# Patient Record
Sex: Male | Born: 1960
Health system: Southern US, Community
[De-identification: ages and names within clinical notes are randomized; demographics above are authoritative.]

## PROBLEM LIST (undated history)

## (undated) DIAGNOSIS — I509 Heart failure, unspecified: Secondary | ICD-10-CM

## (undated) DIAGNOSIS — J449 Chronic obstructive pulmonary disease, unspecified: Secondary | ICD-10-CM

## (undated) DIAGNOSIS — F1010 Alcohol abuse, uncomplicated: Secondary | ICD-10-CM

## (undated) DIAGNOSIS — I472 Ventricular tachycardia: Secondary | ICD-10-CM

## (undated) DIAGNOSIS — F319 Bipolar disorder, unspecified: Secondary | ICD-10-CM

## (undated) DIAGNOSIS — F17200 Nicotine dependence, unspecified, uncomplicated: Secondary | ICD-10-CM

## (undated) DIAGNOSIS — B20 Human immunodeficiency virus [HIV] disease: Secondary | ICD-10-CM

## (undated) DIAGNOSIS — R05 Cough: Principal | ICD-10-CM

## (undated) DIAGNOSIS — F419 Anxiety disorder, unspecified: Secondary | ICD-10-CM

## (undated) DIAGNOSIS — I428 Other cardiomyopathies: Secondary | ICD-10-CM

## (undated) DIAGNOSIS — E785 Hyperlipidemia, unspecified: Secondary | ICD-10-CM

## (undated) DIAGNOSIS — J309 Allergic rhinitis, unspecified: Secondary | ICD-10-CM

## (undated) DIAGNOSIS — F329 Major depressive disorder, single episode, unspecified: Secondary | ICD-10-CM

## (undated) DIAGNOSIS — Z21 Asymptomatic human immunodeficiency virus [HIV] infection status: Secondary | ICD-10-CM

## (undated) DIAGNOSIS — F32A Depression, unspecified: Secondary | ICD-10-CM

## (undated) DIAGNOSIS — F4310 Post-traumatic stress disorder, unspecified: Secondary | ICD-10-CM

## (undated) DIAGNOSIS — I4729 Other ventricular tachycardia: Secondary | ICD-10-CM

## (undated) DIAGNOSIS — I10 Essential (primary) hypertension: Secondary | ICD-10-CM

## (undated) DIAGNOSIS — Z95810 Presence of automatic (implantable) cardiac defibrillator: Secondary | ICD-10-CM

## (undated) DIAGNOSIS — J42 Unspecified chronic bronchitis: Secondary | ICD-10-CM

## (undated) DIAGNOSIS — F1490 Cocaine use, unspecified, uncomplicated: Secondary | ICD-10-CM

## (undated) DIAGNOSIS — I5022 Chronic systolic (congestive) heart failure: Secondary | ICD-10-CM

## (undated) DIAGNOSIS — T7840XA Allergy, unspecified, initial encounter: Secondary | ICD-10-CM

## (undated) HISTORY — DX: Other cardiomyopathies: I42.8

## (undated) HISTORY — DX: Ventricular tachycardia: I47.2

## (undated) HISTORY — DX: Other ventricular tachycardia: I47.29

## (undated) HISTORY — DX: Hyperlipidemia, unspecified: E78.5

## (undated) HISTORY — DX: Allergy, unspecified, initial encounter: T78.40XA

## (undated) HISTORY — DX: Cough: R05

## (undated) HISTORY — DX: Allergic rhinitis, unspecified: J30.9

## (undated) HISTORY — DX: Chronic systolic (congestive) heart failure: I50.22

---

## 2004-09-21 ENCOUNTER — Ambulatory Visit: Payer: Self-pay | Admitting: Family Medicine

## 2004-09-22 ENCOUNTER — Encounter: Payer: Self-pay | Admitting: Internal Medicine

## 2004-09-22 ENCOUNTER — Ambulatory Visit: Payer: Self-pay | Admitting: Family Medicine

## 2004-09-29 ENCOUNTER — Ambulatory Visit: Payer: Self-pay | Admitting: *Deleted

## 2004-09-29 ENCOUNTER — Ambulatory Visit: Payer: Self-pay | Admitting: Family Medicine

## 2004-10-06 ENCOUNTER — Emergency Department (HOSPITAL_COMMUNITY): Admission: EM | Admit: 2004-10-06 | Discharge: 2004-10-06 | Payer: Self-pay | Admitting: Emergency Medicine

## 2005-07-24 ENCOUNTER — Ambulatory Visit: Payer: Self-pay | Admitting: Internal Medicine

## 2006-04-13 ENCOUNTER — Ambulatory Visit: Payer: Self-pay | Admitting: Internal Medicine

## 2008-06-08 ENCOUNTER — Emergency Department (HOSPITAL_COMMUNITY): Admission: EM | Admit: 2008-06-08 | Discharge: 2008-06-08 | Payer: Self-pay | Admitting: Emergency Medicine

## 2008-06-15 ENCOUNTER — Ambulatory Visit: Payer: Self-pay | Admitting: Internal Medicine

## 2008-06-15 LAB — CONVERTED CEMR LAB
ALT: 38 units/L (ref 0–53)
Albumin: 3.9 g/dL (ref 3.5–5.2)
Alkaline Phosphatase: 55 units/L (ref 39–117)
Basophils Absolute: 0 10*3/uL (ref 0.0–0.1)
Basophils Relative: 1 % (ref 0–1)
CD4 T Helper %: 39 % (ref 32–62)
Chloride: 110 meq/L (ref 96–112)
Eosinophils Relative: 2 % (ref 0–5)
Glucose, Bld: 77 mg/dL (ref 70–99)
HIV 1 RNA Quant: <50 copies/mL (ref <50)
Lymphocytes Relative: 47 % — ABNORMAL HIGH (ref 12–46)
Lymphs Abs: 1.2 10*3/uL (ref 0.7–4.0)
MCV: 94.1 fL (ref 78.0–100.0)
Neutrophils Relative %: 38 % — ABNORMAL LOW (ref 43–77)
Potassium: 4.1 meq/L (ref 3.5–5.3)
RDW: 14.3 % (ref 11.5–15.5)
Sodium: 142 meq/L (ref 135–145)
Total Bilirubin: 0.4 mg/dL (ref 0.3–1.2)
Total lymphocyte count: 1222 cells/mcL (ref 700–3300)

## 2008-06-25 ENCOUNTER — Ambulatory Visit: Payer: Self-pay | Admitting: Internal Medicine

## 2009-02-01 ENCOUNTER — Encounter: Payer: Self-pay | Admitting: Internal Medicine

## 2009-02-01 ENCOUNTER — Ambulatory Visit: Payer: Self-pay | Admitting: Internal Medicine

## 2009-02-01 DIAGNOSIS — B009 Herpesviral infection, unspecified: Secondary | ICD-10-CM | POA: Insufficient documentation

## 2009-02-01 DIAGNOSIS — B20 Human immunodeficiency virus [HIV] disease: Secondary | ICD-10-CM | POA: Insufficient documentation

## 2009-02-01 DIAGNOSIS — E785 Hyperlipidemia, unspecified: Secondary | ICD-10-CM | POA: Insufficient documentation

## 2009-02-01 DIAGNOSIS — J309 Allergic rhinitis, unspecified: Secondary | ICD-10-CM | POA: Insufficient documentation

## 2009-02-01 DIAGNOSIS — Z21 Asymptomatic human immunodeficiency virus [HIV] infection status: Secondary | ICD-10-CM | POA: Insufficient documentation

## 2009-02-01 DIAGNOSIS — R197 Diarrhea, unspecified: Secondary | ICD-10-CM | POA: Insufficient documentation

## 2009-02-01 LAB — CONVERTED CEMR LAB
ALT: 44 units/L (ref 0–53)
AST: 31 units/L (ref 0–37)
Albumin: 4.2 g/dL (ref 3.5–5.2)
BUN: 14 mg/dL (ref 6–23)
CD4 Count: 585 microliters
Chlamydia, Swab/Urine, PCR: NEGATIVE
Chloride: 110 meq/L (ref 96–112)
Eosinophils Relative: 0 % (ref 0–5)
GC Probe Amp, Urine: NEGATIVE
HIV-1 RNA Quant, Log: <1.68 (ref <1.68)
Lymphocytes Relative: 62 % — ABNORMAL HIGH (ref 12–46)
Lymphs Abs: 1.4 10*3/uL (ref 0.7–4.0)
MCV: 88.6 fL (ref 78.0–100.0)
Neutro Abs: 0.6 10*3/uL — ABNORMAL LOW (ref 1.7–7.7)
Neutrophils Relative %: 25 % — ABNORMAL LOW (ref 43–77)
RBC: 5.34 M/uL (ref 4.22–5.81)
Total Bilirubin: 0.5 mg/dL (ref 0.3–1.2)
Total Protein: 6.7 g/dL (ref 6.0–8.3)
Total lymphocyte count: 1426 cells/mcL (ref 700–3300)
WBC: 2.3 10*3/uL — ABNORMAL LOW (ref 4.0–10.5)

## 2009-02-02 ENCOUNTER — Encounter: Payer: Self-pay | Admitting: Internal Medicine

## 2009-02-04 ENCOUNTER — Emergency Department (HOSPITAL_COMMUNITY): Admission: EM | Admit: 2009-02-04 | Discharge: 2009-02-05 | Payer: Self-pay | Admitting: Emergency Medicine

## 2009-02-08 ENCOUNTER — Encounter: Payer: Self-pay | Admitting: Internal Medicine

## 2009-02-08 ENCOUNTER — Ambulatory Visit: Payer: Self-pay | Admitting: Internal Medicine

## 2009-02-08 LAB — CONVERTED CEMR LAB
HCV Ab: NEGATIVE
HCV Ab: NEGATIVE
Hepatitis B Surface Ag: NEGATIVE
Hepatitis B Surface Ag: NEGATIVE

## 2009-02-18 ENCOUNTER — Telehealth: Payer: Self-pay | Admitting: Internal Medicine

## 2009-04-22 ENCOUNTER — Encounter: Payer: Self-pay | Admitting: Internal Medicine

## 2009-06-24 ENCOUNTER — Encounter: Payer: Self-pay | Admitting: Internal Medicine

## 2009-09-09 ENCOUNTER — Emergency Department (HOSPITAL_COMMUNITY): Admission: EM | Admit: 2009-09-09 | Discharge: 2009-09-09 | Payer: Self-pay | Admitting: Emergency Medicine

## 2009-09-20 ENCOUNTER — Encounter: Payer: Self-pay | Admitting: Internal Medicine

## 2009-09-20 ENCOUNTER — Ambulatory Visit: Payer: Self-pay | Admitting: Internal Medicine

## 2009-09-20 DIAGNOSIS — J189 Pneumonia, unspecified organism: Secondary | ICD-10-CM | POA: Insufficient documentation

## 2009-09-20 LAB — CONVERTED CEMR LAB
ALT: 44 units/L (ref 0–53)
Basophils Absolute: 0 10*3/uL (ref 0.0–0.1)
Basophils Relative: 1 % (ref 0–1)
CD4 T Helper %: 41 % (ref 32–62)
Calcium: 8.9 mg/dL (ref 8.4–10.5)
Cholesterol: 173 mg/dL (ref 0–200)
Creatinine, Ser: 1.11 mg/dL (ref 0.40–1.50)
Eosinophils Relative: 1 % (ref 0–5)
HDL: 59 mg/dL (ref >39)
HIV 1 RNA Quant: <48 copies/mL (ref <48)
HIV-1 RNA Quant, Log: <1.68 (ref <1.68)
LDL Cholesterol: 50 mg/dL (ref 0–99)
Lymphocytes Relative: 55 % — ABNORMAL HIGH (ref 12–46)
Lymphs Abs: 1.9 10*3/uL (ref 0.7–4.0)
Monocytes Relative: 9 % (ref 3–12)
Neutro Abs: 1.2 10*3/uL — ABNORMAL LOW (ref 1.7–7.7)
Neutrophils Relative %: 35 % — ABNORMAL LOW (ref 43–77)
Platelets: 186 10*3/uL (ref 150–400)
Potassium: 4.3 meq/L (ref 3.5–5.3)
RDW: 13.8 % (ref 11.5–15.5)
Sodium: 139 meq/L (ref 135–145)
Total Bilirubin: 0.8 mg/dL (ref 0.3–1.2)
Total lymphocyte count: 1925 cells/mcL (ref 700–3300)
Triglycerides: 321 mg/dL — ABNORMAL HIGH (ref <150)
VLDL: 64 mg/dL — ABNORMAL HIGH (ref 0–40)

## 2009-09-30 ENCOUNTER — Encounter: Payer: Self-pay | Admitting: Internal Medicine

## 2009-09-30 ENCOUNTER — Ambulatory Visit: Payer: Self-pay | Admitting: Internal Medicine

## 2009-10-07 ENCOUNTER — Telehealth: Payer: Self-pay | Admitting: Internal Medicine

## 2010-05-02 ENCOUNTER — Encounter: Payer: Self-pay | Admitting: Internal Medicine

## 2010-05-06 ENCOUNTER — Emergency Department (HOSPITAL_COMMUNITY): Admission: EM | Admit: 2010-05-06 | Discharge: 2010-05-06 | Payer: Self-pay | Admitting: Emergency Medicine

## 2010-05-10 ENCOUNTER — Emergency Department (HOSPITAL_COMMUNITY): Admission: EM | Admit: 2010-05-10 | Discharge: 2010-05-11 | Payer: Self-pay | Admitting: Emergency Medicine

## 2011-01-18 NOTE — Miscellaneous (Signed)
Summary: Orders Update  Clinical Lists Changes  Orders: Added new Test order of T-Chlamydia  Probe, urine [phone]) - Signed Added new Test order of T-CBC w/Diff [id number]) - Signed Added new Test order of T-CD4SP [facility] [place]) (CD4SP) - Signed Added new Test order of T-GC Probe, urine [phone]) - Signed Added new Test order of T-Comprehensive Metabolic Panel [phone]) - Signed Added new Test order of T-HIV Viral Load [phone]) - Signed Added new Test order of T-RPR (Syphilis) ([id number]) - Signed Added new Test order of T-Urinalysis ([id number]) - Signed     Process Orders Check Orders Results:     [facility]: ABN not required for this insurance Tests Sent for requisitioning (May 02, 2010 4:26 PM):     05/02/2010: [facility] -- [employer], urine [id number] (signed)     05/02/2010: [facility] -- T-CBC w/Diff [[id number]] (signed)     05/02/2010: [facility] -- T-GC Probe, urine [phone] (signed)     05/02/2010: [facility] -- T-Comprehensive Metabolic Panel [[id number]] (signed)     05/02/2010: [facility] -- T-HIV Viral Load [id number] (signed)     05/02/2010: [facility] -- T-RPR (Syphilis) [id number] (signed)     05/02/2010: [facility] -- T-Urinalysis [[id number]] (signed)

## 2011-03-06 LAB — COMPREHENSIVE METABOLIC PANEL
ALT: 47 U/L (ref 0–53)
AST: 47 U/L — ABNORMAL HIGH (ref 0–37)
Albumin: 3.6 g/dL (ref 3.5–5.2)
Alkaline Phosphatase: 48 U/L (ref 39–117)
BUN: 18 mg/dL (ref 6–23)
Creatinine, Ser: 1.14 mg/dL (ref 0.4–1.5)
GFR calc Af Amer: >60 mL/min (ref >60)
Potassium: 3.4 mEq/L — ABNORMAL LOW (ref 3.5–5.1)
Sodium: 139 mEq/L (ref 135–145)
Total Bilirubin: 1.1 mg/dL (ref 0.3–1.2)
Total Protein: 5.9 g/dL — ABNORMAL LOW (ref 6.0–8.3)

## 2011-03-06 LAB — URINALYSIS, ROUTINE W REFLEX MICROSCOPIC
Bilirubin Urine: NEGATIVE
Glucose, UA: NEGATIVE mg/dL
Protein, ur: NEGATIVE mg/dL
Specific Gravity, Urine: 1.014 (ref 1.005–1.030)
Urobilinogen, UA: 1 mg/dL (ref 0.0–1.0)
pH: 5.5 (ref 5.0–8.0)

## 2011-03-06 LAB — DIFFERENTIAL
Eosinophils Relative: 1 % (ref 0–5)
Lymphocytes Relative: 43 % (ref 12–46)
Monocytes Absolute: 0.5 10*3/uL (ref 0.1–1.0)
Neutrophils Relative %: 44 % (ref 43–77)

## 2011-03-06 LAB — CBC
MCHC: 35.3 g/dL (ref 30.0–36.0)
WBC: 4.4 10*3/uL (ref 4.0–10.5)

## 2011-03-06 LAB — RPR: RPR Ser Ql: NONREACTIVE

## 2011-03-06 LAB — LIPASE, BLOOD: Lipase: 20 U/L (ref 11–59)

## 2011-03-06 LAB — GC/CHLAMYDIA PROBE AMP, GENITAL: GC Probe Amp, Genital: NEGATIVE

## 2011-03-24 LAB — DIFFERENTIAL
Basophils Absolute: 0 10*3/uL (ref 0.0–0.1)
Basophils Relative: 0 % (ref 0–1)
Eosinophils Absolute: 0 10*3/uL (ref 0.0–0.7)
Eosinophils Relative: 0 % (ref 0–5)
Neutro Abs: 4.2 10*3/uL (ref 1.7–7.7)
Neutrophils Relative %: 82 % — ABNORMAL HIGH (ref 43–77)

## 2011-03-24 LAB — CULTURE, BLOOD (ROUTINE X 2): Culture: NO GROWTH

## 2011-03-24 LAB — CBC
HCT: 53.1 % — ABNORMAL HIGH (ref 39.0–52.0)
Hemoglobin: 18.4 g/dL — ABNORMAL HIGH (ref 13.0–17.0)
MCHC: 34.6 g/dL (ref 30.0–36.0)
Platelets: 146 10*3/uL — ABNORMAL LOW (ref 150–400)

## 2011-03-24 LAB — POCT I-STAT 3, ART BLOOD GAS (G3+)
Acid-base deficit: 2 mmol/L (ref 0.0–2.0)
Bicarbonate: 20.7 mEq/L (ref 20.0–24.0)
TCO2: 22 mmol/L (ref 0–100)
pCO2 arterial: 29.7 mmHg — ABNORMAL LOW (ref 35.0–45.0)
pO2, Arterial: 72 mmHg — ABNORMAL LOW (ref 80.0–100.0)

## 2011-03-24 LAB — BASIC METABOLIC PANEL
Calcium: 8.8 mg/dL (ref 8.4–10.5)
Creatinine, Ser: 0.91 mg/dL (ref 0.4–1.5)
GFR calc non Af Amer: >60 mL/min (ref >60)
Sodium: 139 mEq/L (ref 135–145)

## 2011-06-27 ENCOUNTER — Inpatient Hospital Stay (HOSPITAL_COMMUNITY)
Admission: EM | Admit: 2011-06-27 | Discharge: 2011-07-04 | DRG: 292 | Disposition: A | Discharge status: Home / Self Care | Payer: 59 | Attending: Internal Medicine | Admitting: Internal Medicine

## 2011-06-27 ENCOUNTER — Emergency Department (HOSPITAL_COMMUNITY): Payer: Self-pay

## 2011-06-27 DIAGNOSIS — IMO0002 Reserved for concepts with insufficient information to code with codable children: Secondary | ICD-10-CM

## 2011-06-27 DIAGNOSIS — I509 Heart failure, unspecified: Secondary | ICD-10-CM | POA: Diagnosis present

## 2011-06-27 DIAGNOSIS — Z21 Asymptomatic human immunodeficiency virus [HIV] infection status: Secondary | ICD-10-CM | POA: Diagnosis present

## 2011-06-27 DIAGNOSIS — Z9119 Patient's noncompliance with other medical treatment and regimen: Secondary | ICD-10-CM

## 2011-06-27 DIAGNOSIS — A6000 Herpesviral infection of urogenital system, unspecified: Secondary | ICD-10-CM | POA: Diagnosis present

## 2011-06-27 DIAGNOSIS — Z7982 Long term (current) use of aspirin: Secondary | ICD-10-CM

## 2011-06-27 DIAGNOSIS — Z79899 Other long term (current) drug therapy: Secondary | ICD-10-CM

## 2011-06-27 DIAGNOSIS — F1010 Alcohol abuse, uncomplicated: Secondary | ICD-10-CM | POA: Diagnosis present

## 2011-06-27 DIAGNOSIS — F1410 Cocaine abuse, uncomplicated: Secondary | ICD-10-CM | POA: Diagnosis present

## 2011-06-27 DIAGNOSIS — J209 Acute bronchitis, unspecified: Secondary | ICD-10-CM | POA: Diagnosis present

## 2011-06-27 DIAGNOSIS — I428 Other cardiomyopathies: Secondary | ICD-10-CM | POA: Diagnosis present

## 2011-06-27 DIAGNOSIS — Z91199 Patient's noncompliance with other medical treatment and regimen due to unspecified reason: Secondary | ICD-10-CM

## 2011-06-27 DIAGNOSIS — I472 Ventricular tachycardia, unspecified: Secondary | ICD-10-CM | POA: Diagnosis present

## 2011-06-27 DIAGNOSIS — Z5900 Homelessness unspecified: Secondary | ICD-10-CM

## 2011-06-27 DIAGNOSIS — N179 Acute kidney failure, unspecified: Secondary | ICD-10-CM | POA: Diagnosis present

## 2011-06-27 DIAGNOSIS — I5023 Acute on chronic systolic (congestive) heart failure: Principal | ICD-10-CM | POA: Diagnosis present

## 2011-06-27 DIAGNOSIS — F17200 Nicotine dependence, unspecified, uncomplicated: Secondary | ICD-10-CM | POA: Diagnosis present

## 2011-06-27 DIAGNOSIS — I10 Essential (primary) hypertension: Secondary | ICD-10-CM | POA: Diagnosis present

## 2011-06-27 DIAGNOSIS — I4729 Other ventricular tachycardia: Secondary | ICD-10-CM | POA: Diagnosis present

## 2011-06-27 HISTORY — DX: Human immunodeficiency virus (HIV) disease: B20

## 2011-06-27 HISTORY — DX: Essential (primary) hypertension: I10

## 2011-06-27 HISTORY — DX: Asymptomatic human immunodeficiency virus (hiv) infection status: Z21

## 2011-06-27 HISTORY — DX: Nicotine dependence, unspecified, uncomplicated: F17.200

## 2011-06-27 HISTORY — DX: Heart failure, unspecified: I50.9

## 2011-06-28 ENCOUNTER — Encounter (HOSPITAL_COMMUNITY): Payer: Self-pay | Admitting: Radiology

## 2011-06-28 ENCOUNTER — Emergency Department (HOSPITAL_COMMUNITY): Payer: Self-pay

## 2011-06-28 DIAGNOSIS — R072 Precordial pain: Secondary | ICD-10-CM

## 2011-06-28 LAB — LIPID PANEL
Cholesterol: 134 mg/dL (ref 0–200)
Total CHOL/HDL Ratio: 2.4 RATIO
VLDL: 32 mg/dL (ref 0–40)

## 2011-06-28 LAB — CK TOTAL AND CKMB (NOT AT ARMC)
CK, MB: 7.2 ng/mL (ref 0.3–4.0)
Relative Index: 2.1 (ref 0.0–2.5)
Total CK: 340 U/L — ABNORMAL HIGH (ref 7–232)

## 2011-06-28 LAB — CARDIAC PANEL(CRET KIN+CKTOT+MB+TROPI): Total CK: 256 U/L — ABNORMAL HIGH (ref 7–232)

## 2011-06-28 LAB — DIFFERENTIAL
Basophils Absolute: 0 10*3/uL (ref 0.0–0.1)
Eosinophils Absolute: 0 10*3/uL (ref 0.0–0.7)
Eosinophils Relative: 1 % (ref 0–5)

## 2011-06-28 LAB — CBC
MCV: 91.2 fL (ref 78.0–100.0)
Platelets: 150 10*3/uL (ref 150–400)
RDW: 13.1 % (ref 11.5–15.5)
WBC: 4.5 10*3/uL (ref 4.0–10.5)

## 2011-06-28 LAB — TROPONIN I: Troponin I: <0.3 ng/mL (ref <0.30)

## 2011-06-28 LAB — BASIC METABOLIC PANEL
Chloride: 101 mEq/L (ref 96–112)
GFR calc Af Amer: >60 mL/min (ref >60)
Potassium: 3.1 mEq/L — ABNORMAL LOW (ref 3.5–5.1)

## 2011-06-28 LAB — DIGOXIN LEVEL: Digoxin Level: <0.3 ng/mL — ABNORMAL LOW (ref 0.8–2.0)

## 2011-06-28 LAB — LACTATE DEHYDROGENASE: LDH: 323 U/L — ABNORMAL HIGH (ref 94–250)

## 2011-06-28 MED ORDER — IOHEXOL 350 MG/ML SOLN
100.0000 mL | Freq: Once | INTRAVENOUS | Status: AC | PRN
  Administered 2011-06-28: 100 mL via INTRAVENOUS

## 2011-06-29 DIAGNOSIS — I5023 Acute on chronic systolic (congestive) heart failure: Secondary | ICD-10-CM

## 2011-06-29 LAB — BASIC METABOLIC PANEL
BUN: 12 mg/dL (ref 6–23)
CO2: 27 mEq/L (ref 19–32)
Chloride: 105 mEq/L (ref 96–112)
Creatinine, Ser: 0.95 mg/dL (ref 0.50–1.35)
Glucose, Bld: 136 mg/dL — ABNORMAL HIGH (ref 70–99)

## 2011-06-29 LAB — CBC
HCT: 44.9 % (ref 39.0–52.0)
Hemoglobin: 16.3 g/dL (ref 13.0–17.0)
MCV: 92.4 fL (ref 78.0–100.0)
RBC: 4.86 MIL/uL (ref 4.22–5.81)
RDW: 13.4 % (ref 11.5–15.5)
WBC: 1.9 10*3/uL — ABNORMAL LOW (ref 4.0–10.5)

## 2011-06-29 LAB — EPSTEIN-BARR VIRUS VCA, IGM: EBV VCA IgM: 0.2 {ISR}

## 2011-06-30 DIAGNOSIS — I509 Heart failure, unspecified: Secondary | ICD-10-CM

## 2011-06-30 LAB — BASIC METABOLIC PANEL
Calcium: 8.7 mg/dL (ref 8.4–10.5)
GFR calc Af Amer: >60 mL/min (ref >60)
GFR calc non Af Amer: >60 mL/min (ref >60)
Potassium: 3.8 mEq/L (ref 3.5–5.1)
Sodium: 137 mEq/L (ref 135–145)

## 2011-06-30 LAB — HEMOGLOBIN A1C
Hgb A1c MFr Bld: 5.3 % (ref <5.7)
Mean Plasma Glucose: 105 mg/dL (ref <117)

## 2011-06-30 LAB — CMV IGM: CMV IgM: 0.27 (ref <0.90)

## 2011-07-01 LAB — BASIC METABOLIC PANEL
CO2: 26 mEq/L (ref 19–32)
Calcium: 8.9 mg/dL (ref 8.4–10.5)
Glucose, Bld: 91 mg/dL (ref 70–99)
Potassium: 3.8 mEq/L (ref 3.5–5.1)
Sodium: 135 mEq/L (ref 135–145)

## 2011-07-02 LAB — BASIC METABOLIC PANEL
BUN: 24 mg/dL — ABNORMAL HIGH (ref 6–23)
Chloride: 100 mEq/L (ref 96–112)
Glucose, Bld: 110 mg/dL — ABNORMAL HIGH (ref 70–99)
Potassium: 4.3 mEq/L (ref 3.5–5.1)

## 2011-07-03 LAB — BASIC METABOLIC PANEL
BUN: 22 mg/dL (ref 6–23)
CO2: 24 mEq/L (ref 19–32)
Chloride: 103 mEq/L (ref 96–112)
Creatinine, Ser: 1.18 mg/dL (ref 0.50–1.35)

## 2011-07-04 LAB — BASIC METABOLIC PANEL
CO2: 25 mEq/L (ref 19–32)
Calcium: 9.1 mg/dL (ref 8.4–10.5)
Creatinine, Ser: 0.92 mg/dL (ref 0.50–1.35)
Glucose, Bld: 88 mg/dL (ref 70–99)

## 2011-07-04 NOTE — H&P (Signed)
[name], Long NO.:  [id number]  MEDICAL RECORD NO.:  [id number]  LOCATION:  [facility]                         FACILITY:  [facility]  PHYSICIAN:  Ryan Long, MDDATE OF BIRTH:  1961/04/22  DATE OF ADMISSION:  06/27/2011 DATE OF DISCHARGE:                             HISTORY & PHYSICAL   PRIMARY CARE PHYSICIAN:  Ryan Endo L. Philipp Deputy, MD  CHIEF COMPLAINT:  Chest pain.  HISTORY OF PRESENT ILLNESS:  A 50 year old male with known history of HIV, hypertension, who was recently diagnosed with CHF 3 months ago when he was admitted to [facility] for shortness of breath  and was told that his heart was weak.  Presenting Complaint was chest pain. The patient stated he has been having chest pain over the last 1 week, which is retrosternal, stabbing in nature, increased with deep inspiration.  Also increases with exertion.  It happens few minutes and happened multiple times.  In the ER, the patient's cardiac enzymes which at this time was showing increased CK but normal troponin.  His D-dimer was high for which a CT angio chest was done, which was negative for PE. The patient was admitted for further workup.  The patient stated in addition to his chest pain, he also gets off and on shortness of breath with some cough.  He denies any fever, chills, or phlegm.  He denies any headache or visual symptoms.  He sometimes gets tachycardic and palpitations and at the same time gets dizzy.  He has not lost consciousness.  He denies any focal deficit.  He denies any nausea, vomiting, abdominal pain, dysuria, discharge, or diarrhea.  PAST MEDICAL HISTORY: 1. History of HIV. 2. History of hypertension and was recently diagnosed with CHF at     [facility] in March 2012. 3. History of genital herpes.  PAST SURGICAL HISTORY:  None.  MEDICATION ON ADMISSION:  Coreg, digoxin, Isentress, lisinopril, Truvada, and valacyclovir.  ALLERGIES:  No known drug  allergies.  FAMILY HISTORY:  Nothing contributory.  SOCIAL HISTORY:  The patient smokes cigarettes, denies any alcohol or drug abuse.  REVIEW OF SYSTEMS:  As per the history of present illness, nothing else significant.  PHYSICAL EXAMINATION:  GENERAL:  The patient examined at bedside, not in acute distress. VITAL SIGNS:  Blood pressure 116/70, pulse is 90 per minute, temperature 97.8, respirations 18, O2 sat 97%. HEENT:  Anicteric.  No pallor.  No discharge from ears, eyes, nose, or mouth. CHEST:  Bilateral air entry present.  No rhonchi.  No crepitation. HEART:  S1 and S2 heard. ABDOMEN:  Soft, nontender.  Bowel sounds heard. CNS:  The patient is alert, awake, oriented to time, place, and person. Moves upper and lower extremities, 5/5. EXTREMITIES:  Peripheral pulses felt.  No acute ischemic changes, cyanosis, clubbing, or any edema.  LABORATORY DATA:  EKG shows normal sinus rhythm with sinus tachycardia. Beats around 101 beats per minute with poor R-wave progression and T- wave changes concerning for ischemia.  CBC:  WBCs 4.5, hemoglobin is 13.8, hematocrit 46.5, platelets 150.  D-dimer 1.1.  Basic metabolic panel:  Sodium 138, potassium 3.1, chloride 101, carbon dioxide 29, glucose 151, BUN 12, creatinine  0.8, calcium 8.5.  CK is 340, MB is 7.2, troponin less than 0.3, BNP 696.  Digoxin less than 0.3.  Chest x-ray shows no evidence of active pulmonary disease.  CT angio chest shows no evidence of significant pulmonary embolism, bronchial wall thickening suggesting changes of chronic bronchitis with reactive airways disease.  ASSESSMENT: 1. Chest pain, rule out acute coronary syndrome. 2. History of congestive heart failure per the patient. 3. History of human immunodeficiency virus with last CD-4 count more     than 515, February 2012 and viral load was undetectable as per the     patient. 4. History of hypertension. 5. History of genital herpes.  PLAN: 1. At this  time, we will admit the patient to telemetry. 2. For chest pain, we will cycle cardiac markers.  The patient will be     on aspirin, nitroglycerin p.r.n., and get a 2-D echo.  I have     already consulted Cardiology.  We will follow their recommendation. 3. History of HIV.  The patient states that CD-4 count was 515 in     February 2012 and viral load was undetectable.  At this time, we     will repeat his CD-4 count and viral load.  At this time, the     patient also was saying that he is sometimes short of breath which     could be from his possible CHF.  We do not have any reference at     this time, but with regard to his HIV, I am going to get his LDH     checked and also his viral load and CD-4 count.  Until then, I am     going to keep the patient on Bactrim PCP dose, which can be     discontinued once these labs are available, and if there is no     definite proof for PCP.  At this time, the patient is saturating     100% on room air. 4. History of hypertension.  The patient is on Coreg, lisinopril, and     digoxin.  We need to verify his home medication and continue and     further recommendation based on labs ordered, clinical course, and     recommendations.     Ryan Clos, MD     ANK/MEDQ  D:  06/28/2011  T:  06/28/2011  Job:  [id number]  cc:   Ryan Long, M.D.  Electronically Signed by Ryan Minium MD on 07/04/2011 07:27:16 AM

## 2011-07-12 NOTE — Discharge Summary (Signed)
Ryan Long, [name] NO.:  [id number]  MEDICAL RECORD NO.:  [id number]  LOCATION:                                 FACILITY:  PHYSICIAN:  Clydia Llano, MD       DATE OF BIRTH:  12/16/1961  DATE OF ADMISSION:  06/27/2011 DATE OF DISCHARGE:                              DISCHARGE SUMMARY   PRIMARY CARE PHYSICIAN:  Dr. Earmon Phoenix at [facility]  PRIMARY CARDIOLOGIST:  Dr. Lew Dawes, [facility].  REASON FOR ADMISSION:  Chest pain/tightness.  DISCHARGE DIAGNOSES: 1. Mild acute-on-chronic systolic heart failure. 2. Acute bronchitis. 3. Human immunodeficiency virus. 4. Genital herpes. 5. Acute renal failure, resolved. 6. Nonsustained ventricular tachycardia.  DISCHARGE MEDICATIONS: 1. Aspirin 81 mg p.o. daily. 2. Carvedilol 3.125 mg p.o. b.i.d. 3. Truvada one tablet p.o. daily. 4. Furosemide 20 mg p.o. daily. 5. Xopenex inhaler 0.63 four tablets daily as needed for shortness of     breath. 6. Prednisone 10 mg taper, finish in 6 days. 7. Isentress 400 mg p.o. b.i.d. 8. Lisinopril 5 mg p.o. daily.  CONSULTS:  Bevelyn Buckles. Bensimhon, MD,  Cardiology.  BRIEF HISTORY AND EXAMINATION:  [name] is a 50 year old male with known history of HIV and recently diagnosed congestive heart failure about 3 months ago.  The patient came into the hospital complaining about chest pain, chest tightness, lasting for the last week.  The patient says it is retrosternal, stabbing in nature, increased with deep inspiration, also increased with exertion.  Happened for a few minutes and multiple times in the emergency department.  Initial evaluation showed negative cardiac enzymes and negative troponin.  D-dimer was high.  The patient was evaluated by CT angio which was negative for a PE and showing some bronchitic changes.  The patient is admitted for further evaluation.  RADIOLOGY: 1. CT angio showed no evidence of significant PE,  bronchial wall     thickening suggesting changes of chronic bronchitis or reactive     airway disease. 2. Chest x-ray, no evidence of active pulmonary disease.  BRIEF HOSPITAL COURSE:  1. Mild acute-on-chronic systolic heart failure.  The patient because     of the chest pain and shortness of breath was admitted to the     hospital, started on his home medication.  Echocardiogram was     obtained, showed ejection fraction of 33% with mild mitral     regurgitation.  There is no wall motion abnormality.  Cardiology     was consulted.  The patient was placed on lisinopril, Coreg,     Aldactone, and Lasix but his kidney function was declining, so the     Aldactone and the lisinopril were held and Lasix dose was decreased     to 20 mg.  At the time of discharge, Aldactone was discontinued and     the patient is to follow up with his primary care physician.  He is     to take Coreg, lisinopril, and low dose of Lasix.  His breathing     got much better as well as his shortness of breath, he did not need  oxygen.  His vitals on the day of discharge, he was satting 99% on     room air and his respirations were 20, blood pressure is 124/94. 2. Acute bronchitis.  The patient after got admitted to the hospital     started to wheeze, was complaining about some wheezing.  Initially,     it was thought to be secondary to the beta-blockers but then he     said he was taking the beta-blockers for the past 3 months without     any problems.  The patient also was coughing some.  The patient was     put on Zithromax for 5 days as well as the prednisone and     mucolytic.  The patient was doing fine.  On the day of discharge,     he was not wheezing.  The patient is to continue on prednisone     taper for 6 days. 3. Nonsustained ventricular tachycardia.  The patient noticed on the     cardiac telemetry to have episodes of nonsustained ventricular     tachycardia.  Cardiology recommended to continue on  the beta-     blocker and optimize the medical therapy before thinking about     implanted defibrillator/pacemakers.  The patient is to follow up     with his primary cardiologist.  He will be on Coreg. 4. Acute renal failure.  This was mild and thought to be secondary to     dehydration from diuresis and the ACE inhibitors.  His creatinine     went up to 1.4.  After holding of the medications, it went down to     1.0 on the day of discharge. 5. History of HIV infection and genital herpes.  The patient is on his     HAART on Truvada and Isentress.  His CD-4 count is 550.  The     patient is to be continued on his medications. 6. Nonischemic cardiomyopathy.  The patient did have an ejection     fraction of 30-35% with global hypokinesia.  Cardiology was     consulted to determine the cause of the cardiomyopathy.  It seemed     as the patient did have recent workup in [facility] [place] angiogram     and there is coronary calcium which does not suggest ischemic     cardiomyopathy.  His CD-4 does not qualify him for AIDS.  According     to Cardiology,  recommendation in order to have HIV-mediated     cardiomyopathy, your CD-4 count should be less than 100.  The     cardiomyopathy is likely to be secondary to dilated cardiomyopathy,     secondary to substance abuse.  As mentioned above, recommendation     is to optimize medical therapy and then check on the ventricular     arrhythmias.  DISCHARGE INSTRUCTIONS: 1. Activity as tolerated. 2. Disposition.  Home. 3. Dietary, heart-healthy diet.     Clydia Llano, MD     ME/MEDQ  D:  07/04/2011  T:  07/04/2011  Job:  [id number]  cc:   Dr. Earmon Phoenix at [facility] Dr. Lew Dawes, [facility]  Electronically Signed by Clydia Llano  on 07/12/2011 08:08:31 PM

## 2011-07-13 ENCOUNTER — Encounter: Payer: Self-pay | Admitting: Physician Assistant

## 2011-07-18 ENCOUNTER — Encounter: Payer: Self-pay | Admitting: Physician Assistant

## 2011-08-09 ENCOUNTER — Emergency Department (HOSPITAL_COMMUNITY)
Admission: EM | Admit: 2011-08-09 | Discharge: 2011-08-10 | Disposition: A | Discharge status: Home / Self Care | Payer: Self-pay | Attending: Emergency Medicine | Admitting: Emergency Medicine

## 2011-08-09 ENCOUNTER — Other Ambulatory Visit: Payer: Self-pay | Admitting: Emergency Medicine

## 2011-08-09 DIAGNOSIS — Z21 Asymptomatic human immunodeficiency virus [HIV] infection status: Secondary | ICD-10-CM | POA: Insufficient documentation

## 2011-08-09 DIAGNOSIS — N342 Other urethritis: Secondary | ICD-10-CM | POA: Insufficient documentation

## 2011-08-09 DIAGNOSIS — I10 Essential (primary) hypertension: Secondary | ICD-10-CM | POA: Insufficient documentation

## 2011-08-09 DIAGNOSIS — I509 Heart failure, unspecified: Secondary | ICD-10-CM | POA: Insufficient documentation

## 2011-08-09 DIAGNOSIS — Z79899 Other long term (current) drug therapy: Secondary | ICD-10-CM | POA: Insufficient documentation

## 2011-08-09 DIAGNOSIS — R300 Dysuria: Secondary | ICD-10-CM | POA: Insufficient documentation

## 2011-08-09 DIAGNOSIS — R369 Urethral discharge, unspecified: Secondary | ICD-10-CM | POA: Insufficient documentation

## 2011-08-10 ENCOUNTER — Other Ambulatory Visit: Payer: Self-pay

## 2011-08-10 ENCOUNTER — Other Ambulatory Visit: Payer: Self-pay | Admitting: Emergency Medicine

## 2011-08-10 DIAGNOSIS — B20 Human immunodeficiency virus [HIV] disease: Secondary | ICD-10-CM

## 2011-08-14 ENCOUNTER — Other Ambulatory Visit: Payer: Self-pay

## 2011-08-14 ENCOUNTER — Other Ambulatory Visit: Payer: Self-pay | Admitting: Internal Medicine

## 2011-08-14 DIAGNOSIS — B20 Human immunodeficiency virus [HIV] disease: Secondary | ICD-10-CM

## 2011-08-14 LAB — URINALYSIS, ROUTINE W REFLEX MICROSCOPIC
Bilirubin Urine: NEGATIVE
Glucose, UA: NEGATIVE mg/dL
Ketones, ur: NEGATIVE mg/dL
Leukocytes, UA: NEGATIVE
pH: 5.5 (ref 5.0–8.0)

## 2011-08-15 ENCOUNTER — Telehealth: Payer: Self-pay | Admitting: *Deleted

## 2011-08-15 LAB — COMPLETE METABOLIC PANEL WITH GFR
ALT: 25 U/L (ref 0–53)
AST: 18 U/L (ref 0–37)
Albumin: 4.7 g/dL (ref 3.5–5.2)
Alkaline Phosphatase: 43 U/L (ref 39–117)
Glucose, Bld: 87 mg/dL (ref 70–99)
Potassium: 4.2 mEq/L (ref 3.5–5.3)
Sodium: 140 mEq/L (ref 135–145)
Total Protein: 6.6 g/dL (ref 6.0–8.3)

## 2011-08-15 LAB — CBC WITH DIFFERENTIAL/PLATELET
Basophils Relative: 1 % (ref 0–1)
Hemoglobin: 16.9 g/dL (ref 13.0–17.0)
MCHC: 36.3 g/dL — ABNORMAL HIGH (ref 30.0–36.0)
Monocytes Relative: 12 % (ref 3–12)
Neutro Abs: 1.6 10*3/uL — ABNORMAL LOW (ref 1.7–7.7)
Neutrophils Relative %: 44 % (ref 43–77)
RBC: 4.94 MIL/uL (ref 4.22–5.81)

## 2011-08-15 NOTE — Consult Note (Signed)
NAMEKAZUMI, LACHNEY NO.:  [id number]  MEDICAL RECORD NO.:  [id number]  LOCATION:  2025                         FACILITY:  [facility]  PHYSICIAN:  Bevelyn Buckles. Bensimhon, MDDATE OF BIRTH:  06-18-1961  DATE OF CONSULTATION:  06/29/2011 DATE OF DISCHARGE:                                CONSULTATION   PRIMARY CARE PHYSICIAN:  None.  CARDIOLOGIST:  New to [facility].  REQUESTING PHYSICIAN:  Clydia Llano, MD of the [facility].  REASON FOR CONSULTATION:  Reduced ejection fraction.  HISTORY OF PRESENT ILLNESS:  Mr. Gillson is a 50 year old male with a history of HIV, hypertension, and polysubstance abuse.  In looking over his records, he was admitted to [facility] in March of this year with chest pain.  He initially underwent a CT scan of his chest, which showed no evidence of pulmonary embolus.  He then had an echocardiogram which showed an ejection fraction of 25% with global hypokinesis.  He subsequently underwent a coronary CT which showed normal coronary arteries.  He was apparently started on ACE inhibitor and beta-blocker.  He was homeless and then moved to [place] to stay with some family.  He subsequently moved back.  Unfortunately, he is currently homeless again.  He was admitted on June 27, 2011, for chest pain, worse with inspiration and expiration.  His CKs were mildly elevated, but his troponins were normal.  He ended up with another CT angio of the chest for pulmonary embolus and this was negative.  There was some bronchial wall thickening suggestive of chronic bronchitis. There was no mention of heart failure.  He underwent a repeat echocardiogram, which showed an ejection fraction of 30% to 35%.  The ventricle was mildly dilated with left ventricular internal diameter and diastole of 54 mm.  There was global hypokinesis.  There was mild mitral regurgitation, but no other significant valvular disease.  While in  the hospital, he has also had several brief runs of asymptomatic nonsustained VT.  From a functional point of view, he says that he gets short of breath on walking a little bit.  If he goes to store, he gets tired after walking a few aisles and has to stop.  He does occasionally get chest pain with this, but his main symptom is dyspnea.  He denies any orthopnea, no PND, no lower extremity edema.  He says he occasionally is dizzy, but denies palpitations or syncope.  He ran out of his heart meds about a week ago.  Unfortunately, he continues to smoke.  He has a history of alcohol and cocaine use, but says that he is not doing this any more.  REVIEW OF SYSTEMS:  He denies pain in his left foot making it difficult to put his shoes on.  He denies a history of gout.  He has had some chills, but no fevers.  He does have occasional wheezing as well as some joint pain.  Remainder of review of systems is negative except for HPI and problem list.  PROBLEM LIST: 1. HIV disease.     a.     Most recent CD4 count is 550.  There is no history of  opportunistic infections. 2. Hypertension. 3. Nonischemic cardiomyopathy with ejection fraction around 30% to 35%     as described above.     a.     Coronary CT at [facility] in March 2012 showed no coronary      artery disease. 4. Tobacco use, ongoing. 5. History of polysubstance abuse.  CURRENT MEDICATIONS:  In the hospital here are, 1. Aspirin 325. 2. Albuterol p.r.n. 3. Enoxaparin 40 a day. 4. Sulfa 2 tablets t.i.d.  SOCIAL HISTORY:  He currently is homeless.  He is not married.  He does not have any children.  He has a history of tobacco one pack per day for many years which is ongoing.  He has a history of alcohol and cocaine use per the chart, which he currently denies.  FAMILY HISTORY:  Father died with a history of myocardial infarctions x2 and hypertension.  PHYSICAL EXAM:  GENERAL:  He is a stoic man, in no acute  distress. VITAL SIGNS:  Blood pressure is 125/56, heart rate is 73.  His respirations are unlabored.  He is satting 98% on room air. HEENT:  Normal. NECK:  Supple.  He has JVP that is probably about 10 cm of water. Carotids are 2+ bilaterally without any bruits.  There is no lymphadenopathy or thyromegaly. CARDIAC:  PMI is nondisplaced.  He is regular.  I do not hear an S3.  He does have a soft mitral regurgitation murmur. LUNGS:  Have minimal end-expiratory wheezing throughout. ABDOMEN:  Full and distended.  He has normal bowel sounds.  No bruits. EXTREMITIES:  Warm with no cyanosis or clubbing.  There is trace edema. No rash. NEURO:  Alert and oriented x3.  Cranial nerves II through XII intact. Moves all four extremities without difficulty.  Affect is reasonable.  LABORATORY DATA:  Chest x-ray shows no CHF.  EKG shows sinus rhythm with inferolateral T-wave inversions.  I do not have an old one to compare. These appeared to be mostly repole abnormalities.  White counts 1.9, hemoglobin 16.3, platelets 116.  Sodium 140, potassium 3.6, BUN 12, creatinine 0.95, glucose 136.  BNP is 696.  Troponins have all been negative less than 0.30.  ASSESSMENT: 1. Acute on chronic systolic heart failure secondary to nonischemic     cardiomyopathy, ejection fraction of 30% to 35% range. 2. Nonsustained ventricular tachycardia, asymptomatic. 3. Human immunodeficiency virus. 4. Medication noncompliance. 5. Homelessness. 6. Trach use, ongoing.  PLAN:  Mr. Jaber now has evidence of acute or chronic heart failure. He has mild-to-moderate volume overload and I suspect he has 10-15 pounds of volume on board.  Would recommend restarting his ACE inhibitor and beta-blocker as well as IV Lasix and spironolactone as tolerated. In reviewing his records, his coronary arteries were normal, so he likely has a nonischemic cardiomyopathy.  Given a CD4 count over 100, it is very unlikely that this is related to  his HIV and more likely related to possibly another virus or his previous substance abuse/hypertension.  He will not be an ICD candidate until his medications are titrated and he has been compliant with them.  He will need social work evaluation to help him with his homelessness.  We appreciate the consult.  We will follow with you as needed.     Bevelyn Buckles. Bensimhon, MD     DRB/MEDQ  D:  06/29/2011  T:  06/29/2011  Job:  [id number]  Electronically Signed by Arvilla Meres MD on 08/15/2011 05:17:32 PM

## 2011-08-15 NOTE — Telephone Encounter (Signed)
Ryan Long with [facility] called to see if we could make a referral for him at a cardiologist office. He had one when he was in the hospital. He has no insurance. Does have ADAP. Told her since we have not seen him, he should call the cards group that saw him in the hospital.  She says she might call the hospitalist for the referral.

## 2011-08-16 LAB — HIV-1 RNA QUANT-NO REFLEX-BLD
HIV 1 RNA Quant: <20 copies/mL (ref <20)
HIV-1 RNA Quant, Log: <1.3 {Log} (ref <1.30)

## 2011-08-23 ENCOUNTER — Emergency Department (HOSPITAL_COMMUNITY)
Admission: EM | Admit: 2011-08-23 | Discharge: 2011-08-23 | Disposition: A | Discharge status: Home / Self Care | Payer: Self-pay | Attending: Emergency Medicine | Admitting: Emergency Medicine

## 2011-08-23 DIAGNOSIS — Z79899 Other long term (current) drug therapy: Secondary | ICD-10-CM | POA: Insufficient documentation

## 2011-08-23 DIAGNOSIS — N342 Other urethritis: Secondary | ICD-10-CM | POA: Insufficient documentation

## 2011-08-23 DIAGNOSIS — I10 Essential (primary) hypertension: Secondary | ICD-10-CM | POA: Insufficient documentation

## 2011-08-23 DIAGNOSIS — R369 Urethral discharge, unspecified: Secondary | ICD-10-CM | POA: Insufficient documentation

## 2011-08-23 DIAGNOSIS — I509 Heart failure, unspecified: Secondary | ICD-10-CM | POA: Insufficient documentation

## 2011-08-23 DIAGNOSIS — R300 Dysuria: Secondary | ICD-10-CM | POA: Insufficient documentation

## 2011-08-23 DIAGNOSIS — Z21 Asymptomatic human immunodeficiency virus [HIV] infection status: Secondary | ICD-10-CM | POA: Insufficient documentation

## 2011-08-24 ENCOUNTER — Encounter: Payer: Self-pay | Admitting: Internal Medicine

## 2011-08-24 ENCOUNTER — Ambulatory Visit (INDEPENDENT_AMBULATORY_CARE_PROVIDER_SITE_OTHER): Payer: Self-pay | Admitting: Internal Medicine

## 2011-08-24 VITALS — BP 112/75 | HR 97 | Temp 97.7°F | Ht 69.0 in | Wt 197.0 lb

## 2011-08-24 DIAGNOSIS — Z23 Encounter for immunization: Secondary | ICD-10-CM

## 2011-08-24 DIAGNOSIS — B20 Human immunodeficiency virus [HIV] disease: Secondary | ICD-10-CM

## 2011-08-24 DIAGNOSIS — I428 Other cardiomyopathies: Secondary | ICD-10-CM

## 2011-08-24 LAB — GC/CHLAMYDIA PROBE AMP, GENITAL: Chlamydia, DNA Probe: NEGATIVE

## 2011-08-24 NOTE — Progress Notes (Signed)
Addended by: Wendall Mola A on: 08/24/2011 02:44 PM   Modules accepted: Orders

## 2011-08-24 NOTE — Progress Notes (Signed)
  Subjective:    Patient ID: Ryan Long, male    DOB: 07-25-1961, 50 y.o.   MRN: [id number]  HPI this 50 year old male comes in to reestablish care for his HIV. He was last seen in 2010 and had previously been on a regimen Atripla and this was switched to a Isentress and Truvada. He tells me he has had nearly 100% compliance and excellent tolerance of the right regimen. He does not have insurance will need to sign up for ADAP. He recently has been in both [name] and has been seen at [facility] do to multiple complaints. He was seen by cardiology Jason Nest and it was noted that he had a low EF.a recent cardiac CT in [place] showed no coronary artery disease. He does have lower extremity edema periodically though no orthopnea.  Despite his lack of followup he has remained undetectable with his viral load and continues to take his HIV medicines well. A previous hepatitis B antibody was indeterminant.    Review of Systems  Constitutional: Negative.  Negative for fever and unexpected weight change.  HENT: Negative.   Eyes: Negative.   Respiratory: Negative.   Cardiovascular: Positive for leg swelling. Negative for chest pain.  Gastrointestinal: Negative.   Musculoskeletal: Negative.   Skin: Negative.   Hematological: Negative.   Psychiatric/Behavioral: Negative.        Objective:   Physical Exam  Constitutional: He is oriented to person, place, and time. He appears well-developed and well-nourished.  HENT:  Mouth/Throat: No oropharyngeal exudate.  Cardiovascular: Normal rate, regular rhythm and normal heart sounds.   No murmur heard. Pulmonary/Chest: Effort normal and breath sounds normal. No respiratory distress. He has no wheezes.  Abdominal: Soft. Bowel sounds are normal. He exhibits no distension. There is no tenderness.  Lymphadenopathy:    He has no cervical adenopathy.  Neurological: He is alert and oriented to person, place, and time.    Skin: Skin is warm and dry. No erythema.  Psychiatric: He has a normal mood and affect.          Assessment & Plan:

## 2011-08-24 NOTE — Assessment & Plan Note (Signed)
At this time he will be continued on his regimen of Isentress with Truvada. He will receive his hepatitis B vaccine. He also will be signed up for ADAP and tells me he has enough medications for the rest of September. I will have him return in 3 months to assure continued compliance and that his other issues are stable. I discussed condom use and exercise and healthy eating.

## 2011-08-24 NOTE — Assessment & Plan Note (Signed)
He has nonischemic cardiomyopathy with an EF of 30-35% in the recent CT showing no significant coronary artery disease. He is on Coreg, and ACE inhibitor and Lasix and states he has been taking that however I am concerned with his lack of insurance that he will not have access to these medications. He however is in the process of obtaining other insurance. At this time his cardiac exam suggests she is well compensated and and doing well. He does have a followup with cardiology next week. I encouraged him to definitely make that appointment.

## 2011-08-29 ENCOUNTER — Ambulatory Visit (INDEPENDENT_AMBULATORY_CARE_PROVIDER_SITE_OTHER): Payer: Self-pay | Admitting: Physician Assistant

## 2011-08-29 ENCOUNTER — Encounter: Payer: Self-pay | Admitting: Physician Assistant

## 2011-08-29 VITALS — BP 102/73 | HR 96 | Ht 69.0 in | Wt 195.8 lb

## 2011-08-29 DIAGNOSIS — I428 Other cardiomyopathies: Secondary | ICD-10-CM

## 2011-08-29 DIAGNOSIS — I5022 Chronic systolic (congestive) heart failure: Secondary | ICD-10-CM

## 2011-08-29 DIAGNOSIS — R110 Nausea: Secondary | ICD-10-CM | POA: Insufficient documentation

## 2011-08-29 DIAGNOSIS — B20 Human immunodeficiency virus [HIV] disease: Secondary | ICD-10-CM

## 2011-08-29 LAB — CBC WITH DIFFERENTIAL/PLATELET
Basophils Relative: 0.4 % (ref 0.0–3.0)
Eosinophils Absolute: 0 10*3/uL (ref 0.0–0.7)
Eosinophils Relative: 0.9 % (ref 0.0–5.0)
Hemoglobin: 17.1 g/dL — ABNORMAL HIGH (ref 13.0–17.0)
Lymphocytes Relative: 43.2 % (ref 12.0–46.0)
MCHC: 34.2 g/dL (ref 30.0–36.0)
Monocytes Relative: 10.2 % (ref 3.0–12.0)
Neutro Abs: 1.4 10*3/uL (ref 1.4–7.7)
RBC: 4.97 Mil/uL (ref 4.22–5.81)
WBC: 3.2 10*3/uL — ABNORMAL LOW (ref 4.5–10.5)

## 2011-08-29 MED ORDER — FAMOTIDINE 20 MG PO TABS: 20.0000 mg | ORAL_TABLET | Freq: Two times a day (BID) | ORAL | Status: DC

## 2011-08-29 MED ORDER — FUROSEMIDE 40 MG PO TABS: 60.0000 mg | ORAL_TABLET | Freq: Every day | ORAL | Status: DC

## 2011-08-29 MED ORDER — POTASSIUM CHLORIDE CRYS ER 20 MEQ PO TBCR: 20.0000 meq | EXTENDED_RELEASE_TABLET | Freq: Every day | ORAL | Status: DC

## 2011-08-29 NOTE — Progress Notes (Signed)
History of Present Illness: Primary Cardiologist:  Dr. Arvilla Meres  Ryan Long is a 50 y.o. male who presents for post hospital follow up.    He was admitted back in July 2012.  He was seen by Dr. Gala Romney.  He has a history of HIV, hypertension and polysubstance abuse.  He also has a history of dilated cardiomyopathy with an EF of 25%.  He was evaluated at [facility] in March of 2012.  Coronary CT angiogram demonstrated no CAD.  His nonischemic cardiomyopathy is not felt to be related to HIV given his good CD4 counts.    He was admitted in July for chest pain.  He was felt to be in acute on chronic systolic heart failure and he was diuresed.  His heart failure medications were adjusted.  His d-dimer was elevated and a CT angiogram was negative for pulmonary embolism.  He had an elevated creatinine and his spironolactone was discontinued.  Discharge creatinine was 0.92.  Echocardiogram 06/28/11: EF 30-35%, mild MR, mild LAE.  His stomach feels full and he feels nauseated.  He increased his Lasix from 20 mg to 40 mg a day on his own.  He has only had minimal relief from this.  He really denies significant shortness of breath.  He describes probable class II-IIb symptoms.  Denies orthopnea, PND or edema.  Denies syncope.  He does feel lightheaded at times when he stands quickly.  He feels his heart skipping sometimes.  He denies any rapid palpitations.  He notes his weight has increased about 3 pounds in the past week.  Past Medical History  Diagnosis Date  . HIV (human immunodeficiency virus infection)   . Chronic systolic heart failure   . Hypertension   . Genital herpes   . Active smoker   . AR (allergic rhinitis)   . HLD (hyperlipidemia)   . NICM (nonischemic cardiomyopathy)     Echocardiogram 06/28/11: EF 30-35%, mild MR, mild LAE;  No CAD by coronary CT angiogram 3/12 at [facility]  . NSVT (nonsustained ventricular tachycardia)     Current  Outpatient Prescriptions  Medication Sig Dispense Refill  . aspirin 81 MG tablet Take 81 mg by mouth daily.        . carvedilol (COREG) 6.25 MG tablet Take 6.25 mg by mouth 2 (two) times daily with a meal.        . digoxin (LANOXIN) 0.125 MG tablet Take 125 mcg by mouth daily.        Marland Kitchen emtricitabine-tenofovir (TRUVADA) 200-300 MG per tablet Take 1 tablet by mouth daily.        Marland Kitchen FLUoxetine (PROZAC) 40 MG capsule Take 40 mg by mouth daily.        . furosemide (LASIX) 40 MG tablet Take 1.5 tablets (60 mg total) by mouth daily.  45 tablet  6  . lisinopril (PRINIVIL,ZESTRIL) 5 MG tablet Take 5 mg by mouth daily.        . Multiple Vitamin (MULTIVITAMIN) tablet Take 1 tablet by mouth daily.        . raltegravir (ISENTRESS) 400 MG tablet Take 400 mg by mouth 2 (two) times daily.        . traZODone (DESYREL) 150 MG tablet Take 150 mg by mouth at bedtime.        . valACYclovir (VALTREX) 500 MG tablet Take 500 mg by mouth daily.        Marland Kitchen DISCONTD: furosemide (LASIX) 40 MG tablet Take 40 mg by  mouth daily.        . famotidine (PEPCID) 20 MG tablet Take 1 tablet (20 mg total) by mouth 2 (two) times daily.  60 tablet  6  . potassium chloride SA (K-DUR,KLOR-CON) 20 MEQ tablet Take 1 tablet (20 mEq total) by mouth daily.  30 tablet  6    Allergies: No Known Allergies  Social history:  He smokes three quarters of a pack of cigarettes per day.  Denies alcohol or drug abuse.  ROS:  Please see the history of present illness.  He denies vomiting or diarrhea.  He denies fevers or chills.  He denies cough or sore throat.  He denies blood in his stools or black stools.  All other systems reviewed and negative.   Vital Signs: BP 102/73  Pulse 96  Ht 5\' 9"  (1.753 m)  Wt 195 lb 12.8 oz (88.814 kg)  BMI 28.91 kg/m2  Filed Vitals:   08/29/11 1106 08/29/11 1107 08/29/11 1108 08/29/11 1109  BP: 114/75 107/69 101/77 102/73  Pulse: 88 88 97 96  Height:    5\' 9"  (1.753 m)  Weight:    195 lb 12.8 oz (88.814 kg)       PHYSICAL EXAM: Well nourished, well developed, in no acute distress HEENT: normal Neck: Minimally elevated JVP Cardiac:  normal S1, S2; RRR; no murmur; No S3 Lungs:  clear to auscultation bilaterally, no rhonchi or rales, faint expiratory wheezing noted Abd: soft, nontender, no hepatomegaly Ext: no edema Skin: warm and dry Neuro:  CNs 2-12 intact, no focal abnormalities noted Psych: Normal affect  EKG:  Sinus rhythm, heart rate 95, normal axis, T wave inversions in leads 2, 3, aVF and V6, no significant change when compared to tracing dated 06/27/11.  ASSESSMENT AND PLAN:

## 2011-08-29 NOTE — Patient Instructions (Addendum)
You have been referred to [facility] [place] [facility] TO ESTABLISH WITH A PRIMARY CARE PHYSICIAN.  Your physician recommends that you return for lab work in: TODAY BMET, BNP, LFT'S, AMYLASE/LIPASE, CBC W/DIFF, DIGOXIN LEVEL HEART FAILURE AND NAUSEA  Your physician recommends that you return for lab work in: 09/06/11 REPEAT BMET/BNP 428.22 HEART FAILURE  Your physician has recommended you make the following change in your medication: INCREASE LASIX TO 60 MG DAILY; START POTASSIUM 20 mEq 1 TABLET DAILY.; START PEPCID 20 MG 1 TABLET TWICE DAILY

## 2011-08-29 NOTE — Assessment & Plan Note (Signed)
Etiology unclear.  This may be from his abdominal fullness.  I will place him on Pepcid 20 mg twice a day.  I will also check a CBC, LFTs, amylase, lipase and digoxin level.

## 2011-08-29 NOTE — Assessment & Plan Note (Addendum)
Medical regimen is okay.  As noted, spironolactone was discontinued secondary to renal insufficiency.  His blood pressure is also somewhat soft.  He notes some lightheadedness at times.  Orthostatics ok today.

## 2011-08-29 NOTE — Assessment & Plan Note (Signed)
He has some symptoms that are suggestive of volume overload.  On exam however he does not appear that significantly overloaded.  He did have abdominal fullness when he was last hospitalized.  I will increase his Lasix to 60 mg a day.  Add potassium 20 mEq a day.  I will check a basic metabolic panel and BNP today.  Repeat a basic metabolic panel and BNP in one week.  We discussed watching his salt intake as well as limiting his fluids to about 50-60 ounces a day.  He will be set up for followup in the next couple of weeks in the congestive heart failure clinic with Dr.  Gala Romney.

## 2011-08-29 NOTE — Assessment & Plan Note (Signed)
He follows with the ID clinic.

## 2011-08-30 LAB — BASIC METABOLIC PANEL
Chloride: 107 mEq/L (ref 96–112)
Creatinine, Ser: 1 mg/dL (ref 0.4–1.5)
Potassium: 4 mEq/L (ref 3.5–5.1)

## 2011-08-30 LAB — AMYLASE: Amylase: 84 U/L (ref 27–131)

## 2011-08-30 LAB — HEPATIC FUNCTION PANEL
AST: 26 U/L (ref 0–37)
Total Bilirubin: 0.8 mg/dL (ref 0.3–1.2)

## 2011-08-30 LAB — BRAIN NATRIURETIC PEPTIDE: Pro B Natriuretic peptide (BNP): 28 pg/mL (ref 0.0–100.0)

## 2011-08-31 NOTE — Progress Notes (Signed)
Quick Note:  RN left message to call back.Sharmon Revere, RN   ______

## 2011-09-01 ENCOUNTER — Ambulatory Visit: Payer: Self-pay

## 2011-09-05 ENCOUNTER — Telehealth: Payer: Self-pay | Admitting: Physician Assistant

## 2011-09-05 NOTE — Telephone Encounter (Signed)
Pt returning call to [facility] this AM, when computers were down. Please return pt call to discuss further.

## 2011-09-05 NOTE — Telephone Encounter (Signed)
pt aware of lab results and repeat bmet 09/12/11 @ f/u w/SW. Danielle Rankin

## 2011-09-05 NOTE — Telephone Encounter (Signed)
PT CAME BY TO GET LAB RESULTS PLEASE CaLL

## 2011-09-12 ENCOUNTER — Ambulatory Visit: Payer: Self-pay

## 2011-09-12 ENCOUNTER — Ambulatory Visit: Payer: Self-pay | Admitting: Physician Assistant

## 2011-09-12 ENCOUNTER — Other Ambulatory Visit: Payer: Self-pay | Admitting: *Deleted

## 2011-09-13 ENCOUNTER — Telehealth: Payer: Self-pay | Admitting: Physician Assistant

## 2011-09-13 ENCOUNTER — Inpatient Hospital Stay (INDEPENDENT_AMBULATORY_CARE_PROVIDER_SITE_OTHER)
Admission: RE | Admit: 2011-09-13 | Discharge: 2011-09-13 | Disposition: A | Discharge status: Home / Self Care | Payer: Self-pay | Source: Ambulatory Visit | Attending: Emergency Medicine | Admitting: Emergency Medicine

## 2011-09-13 ENCOUNTER — Ambulatory Visit (INDEPENDENT_AMBULATORY_CARE_PROVIDER_SITE_OTHER): Payer: Self-pay

## 2011-09-13 DIAGNOSIS — R05 Cough: Secondary | ICD-10-CM

## 2011-09-13 DIAGNOSIS — R059 Cough, unspecified: Secondary | ICD-10-CM

## 2011-09-13 DIAGNOSIS — J069 Acute upper respiratory infection, unspecified: Secondary | ICD-10-CM

## 2011-09-13 NOTE — Telephone Encounter (Signed)
Walk In Pt Form " Pt was seen in urgent care" brought records in for [place], sent to [facility] 09/13/11/km

## 2011-09-14 LAB — GC/CHLAMYDIA PROBE AMP, GENITAL
Chlamydia, DNA Probe: NEGATIVE
GC Probe Amp, Genital: NEGATIVE

## 2011-09-15 ENCOUNTER — Ambulatory Visit: Payer: Self-pay

## 2011-09-17 DIAGNOSIS — F329 Major depressive disorder, single episode, unspecified: Secondary | ICD-10-CM | POA: Insufficient documentation

## 2011-09-19 DIAGNOSIS — F1099 Alcohol use, unspecified with unspecified alcohol-induced disorder: Secondary | ICD-10-CM | POA: Insufficient documentation

## 2011-09-19 DIAGNOSIS — D709 Neutropenia, unspecified: Secondary | ICD-10-CM | POA: Insufficient documentation

## 2011-09-19 DIAGNOSIS — I509 Heart failure, unspecified: Secondary | ICD-10-CM | POA: Insufficient documentation

## 2011-09-20 ENCOUNTER — Emergency Department (HOSPITAL_COMMUNITY): Payer: Self-pay

## 2011-09-20 ENCOUNTER — Emergency Department (HOSPITAL_COMMUNITY)
Admission: EM | Admit: 2011-09-20 | Discharge: 2011-09-20 | Disposition: A | Discharge status: Home / Self Care | Payer: Self-pay | Attending: Emergency Medicine | Admitting: Emergency Medicine

## 2011-09-20 DIAGNOSIS — Z21 Asymptomatic human immunodeficiency virus [HIV] infection status: Secondary | ICD-10-CM | POA: Insufficient documentation

## 2011-09-20 DIAGNOSIS — F3289 Other specified depressive episodes: Secondary | ICD-10-CM | POA: Insufficient documentation

## 2011-09-20 DIAGNOSIS — Z7982 Long term (current) use of aspirin: Secondary | ICD-10-CM | POA: Insufficient documentation

## 2011-09-20 DIAGNOSIS — I10 Essential (primary) hypertension: Secondary | ICD-10-CM | POA: Insufficient documentation

## 2011-09-20 DIAGNOSIS — R05 Cough: Secondary | ICD-10-CM | POA: Insufficient documentation

## 2011-09-20 DIAGNOSIS — R059 Cough, unspecified: Secondary | ICD-10-CM | POA: Insufficient documentation

## 2011-09-20 DIAGNOSIS — I509 Heart failure, unspecified: Secondary | ICD-10-CM | POA: Insufficient documentation

## 2011-09-20 DIAGNOSIS — Z79899 Other long term (current) drug therapy: Secondary | ICD-10-CM | POA: Insufficient documentation

## 2011-09-20 DIAGNOSIS — J40 Bronchitis, not specified as acute or chronic: Secondary | ICD-10-CM | POA: Insufficient documentation

## 2011-09-20 DIAGNOSIS — F329 Major depressive disorder, single episode, unspecified: Secondary | ICD-10-CM | POA: Insufficient documentation

## 2011-09-21 ENCOUNTER — Ambulatory Visit: Payer: Self-pay

## 2011-09-21 ENCOUNTER — Ambulatory Visit: Payer: Self-pay | Admitting: Physician Assistant

## 2011-09-25 ENCOUNTER — Ambulatory Visit (INDEPENDENT_AMBULATORY_CARE_PROVIDER_SITE_OTHER): Payer: Self-pay | Admitting: *Deleted

## 2011-09-25 DIAGNOSIS — B20 Human immunodeficiency virus [HIV] disease: Secondary | ICD-10-CM

## 2011-09-25 DIAGNOSIS — Z23 Encounter for immunization: Secondary | ICD-10-CM

## 2011-09-26 ENCOUNTER — Other Ambulatory Visit: Payer: Self-pay | Admitting: *Deleted

## 2011-09-26 ENCOUNTER — Other Ambulatory Visit: Payer: Self-pay

## 2011-09-26 DIAGNOSIS — B20 Human immunodeficiency virus [HIV] disease: Secondary | ICD-10-CM

## 2011-09-26 DIAGNOSIS — B009 Herpesviral infection, unspecified: Secondary | ICD-10-CM

## 2011-09-26 MED ORDER — RALTEGRAVIR POTASSIUM 400 MG PO TABS: 400.0000 mg | ORAL_TABLET | Freq: Two times a day (BID) | ORAL | Status: DC

## 2011-09-26 MED ORDER — VALACYCLOVIR HCL 500 MG PO TABS: 500.0000 mg | ORAL_TABLET | Freq: Every day | ORAL | Status: DC

## 2011-09-26 MED ORDER — EMTRICITABINE-TENOFOVIR DF 200-300 MG PO TABS: 1.0000 | ORAL_TABLET | Freq: Every day | ORAL | Status: DC

## 2011-09-27 ENCOUNTER — Other Ambulatory Visit: Payer: Self-pay | Admitting: Internal Medicine

## 2011-09-27 ENCOUNTER — Other Ambulatory Visit: Payer: Self-pay

## 2011-09-27 DIAGNOSIS — B20 Human immunodeficiency virus [HIV] disease: Secondary | ICD-10-CM

## 2011-09-28 ENCOUNTER — Encounter (HOSPITAL_COMMUNITY): Payer: Self-pay

## 2011-09-28 LAB — COMPLETE METABOLIC PANEL WITH GFR
AST: 22 U/L (ref 0–37)
Albumin: 4.3 g/dL (ref 3.5–5.2)
Alkaline Phosphatase: 51 U/L (ref 39–117)
BUN: 11 mg/dL (ref 6–23)
Calcium: 9.1 mg/dL (ref 8.4–10.5)
Chloride: 109 mEq/L (ref 96–112)
GFR, Est Non African American: >60 mL/min (ref >60)
Glucose, Bld: 88 mg/dL (ref 70–99)
Potassium: 4 mEq/L (ref 3.5–5.3)
Sodium: 141 mEq/L (ref 135–145)
Total Protein: 6.6 g/dL (ref 6.0–8.3)

## 2011-09-28 LAB — CBC WITH DIFFERENTIAL/PLATELET
Basophils Absolute: 0 10*3/uL (ref 0.0–0.1)
Basophils Relative: 1 % (ref 0–1)
Eosinophils Relative: 1 % (ref 0–5)
HCT: 49.6 % (ref 39.0–52.0)
Lymphocytes Relative: 53 % — ABNORMAL HIGH (ref 12–46)
MCHC: 36.5 g/dL — ABNORMAL HIGH (ref 30.0–36.0)
MCV: 93.4 fL (ref 78.0–100.0)
Monocytes Absolute: 0.3 10*3/uL (ref 0.1–1.0)
Platelets: 202 10*3/uL (ref 150–400)
RDW: 12.8 % (ref 11.5–15.5)
WBC: 2.9 10*3/uL — ABNORMAL LOW (ref 4.0–10.5)

## 2011-10-02 ENCOUNTER — Ambulatory Visit (HOSPITAL_COMMUNITY)
Admission: RE | Admit: 2011-10-02 | Discharge: 2011-10-02 | Disposition: A | Discharge status: Home / Self Care | Payer: Self-pay | Source: Ambulatory Visit | Attending: Internal Medicine | Admitting: Internal Medicine

## 2011-10-02 ENCOUNTER — Encounter (HOSPITAL_COMMUNITY): Payer: Self-pay

## 2011-10-02 ENCOUNTER — Ambulatory Visit: Payer: Self-pay | Admitting: Physician Assistant

## 2011-10-02 VITALS — BP 104/68 | HR 83 | Wt 184.5 lb

## 2011-10-02 DIAGNOSIS — I5022 Chronic systolic (congestive) heart failure: Secondary | ICD-10-CM

## 2011-10-02 MED ORDER — CARVEDILOL 6.25 MG PO TABS: 9.3750 mg | ORAL_TABLET | Freq: Two times a day (BID) | ORAL | Status: DC

## 2011-10-02 MED ORDER — POTASSIUM CHLORIDE CRYS ER 20 MEQ PO TBCR: 20.0000 meq | EXTENDED_RELEASE_TABLET | Freq: Every day | ORAL | Status: DC

## 2011-10-02 MED ORDER — FAMOTIDINE 20 MG PO TABS: 20.0000 mg | ORAL_TABLET | Freq: Two times a day (BID) | ORAL | Status: DC

## 2011-10-02 NOTE — Patient Instructions (Signed)
Increase Carvedilol to 6.25 mg 1 & 1/2 tabs Twice daily  Your physician recommends that you schedule a follow-up appointment in: 1 month  You have been referred to [facility]

## 2011-10-02 NOTE — Assessment & Plan Note (Signed)
NYHA II. Volume status looks good. BP soft. Will try to increase carvedilol to 9.375 bid as tolerated. Recommended abstaining from tobacco and alcohol. Discussed daily weights and sliding scale diuretic.

## 2011-10-02 NOTE — Progress Notes (Signed)
History of Present Illness: ID Doc: [place]  Ryan Long is a 50 y.o. male with a history of HIV, hypertension and polysubstance abuse.  He also has a history of dilated cardiomyopathy with an EF of 25%.  He was evaluated at [facility] in March of 2012.  Coronary CT angiogram demonstrated no CAD.  His nonischemic cardiomyopathy is not felt to be related to HIV given his good CD4 counts.    He was admitted in July for chest pain.  He was felt to be in acute on chronic systolic heart failure and he was diuresed.  His heart failure medications were adjusted.  His d-dimer was elevated and a CT angiogram was negative for pulmonary embolism.  He had an elevated creatinine and his spironolactone was discontinued.  Discharge creatinine was 0.92.  Echocardiogram 06/28/11: EF 30-35%, mild MR, mild LAE.  Previously on spiro but stopped due to renal insufficiency.  He saw Tereso Newcomer last month and BP was soft so no med changes made. Last week had some wheezing and was seen in ER. Diagnosed with bronchitis and given 7 days of doxy.   Presents for f/u in HF clinic. Says he feels ok. No orthopnea, PND or edema. Occasional dyspnea with exertion.Taking all his medications. Has a scale but does not weigh regularly. Occasional mild orthostasis. No syncope. Smoking about a pack of cigarettes per day. Admits to a 6-pack of beer per week. Denies cocaine.   Past Medical History  Diagnosis Date  . HIV (human immunodeficiency virus infection)   . Chronic systolic heart failure   . Hypertension   . Genital herpes   . Active smoker   . AR (allergic rhinitis)   . HLD (hyperlipidemia)   . NICM (nonischemic cardiomyopathy)     Echocardiogram 06/28/11: EF 30-35%, mild MR, mild LAE;  No CAD by coronary CT angiogram 3/12 at [facility]  . NSVT (nonsustained ventricular tachycardia)     Current Outpatient Prescriptions  Medication Sig Dispense Refill  . aspirin 81 MG tablet Take 81 mg by  mouth daily.        . carvedilol (COREG) 6.25 MG tablet Take 6.25 mg by mouth 2 (two) times daily with a meal.        . digoxin (LANOXIN) 0.125 MG tablet Take 125 mcg by mouth daily.        Marland Kitchen emtricitabine-tenofovir (TRUVADA) 200-300 MG per tablet Take 1 tablet by mouth daily.  30 tablet  11  . famotidine (PEPCID) 20 MG tablet Take 1 tablet (20 mg total) by mouth 2 (two) times daily.  60 tablet  6  . FLUoxetine (PROZAC) 40 MG capsule Take 40 mg by mouth daily.        . furosemide (LASIX) 40 MG tablet Take 1.5 tablets (60 mg total) by mouth daily.  45 tablet  6  . lisinopril (PRINIVIL,ZESTRIL) 5 MG tablet Take 5 mg by mouth daily.        . Multiple Vitamin (MULTIVITAMIN) tablet Take 1 tablet by mouth daily.        . potassium chloride SA (K-DUR,KLOR-CON) 20 MEQ tablet Take 1 tablet (20 mEq total) by mouth daily.  30 tablet  6  . raltegravir (ISENTRESS) 400 MG tablet Take 1 tablet (400 mg total) by mouth 2 (two) times daily.  60 tablet  11  . traZODone (DESYREL) 150 MG tablet Take 150 mg by mouth at bedtime.        . valACYclovir (VALTREX) 500 MG tablet  Take 500 mg by mouth 2 (two) times daily.          Allergies: No Known Allergies   ROS:  Please see the history of present illness.  He denies vomiting or diarrhea.  He denies fevers or chills.  He denies cough or sore throat.  He denies blood in his stools or black stools.  All other systems reviewed and negative.   Vital Signs: BP 112/68  Pulse 83  Wt 184 lb 8 oz (83.689 kg)  SpO2 97%  Filed Vitals:   10/02/11 1339  BP: 112/68  Pulse: 83  Weight: 184 lb 8 oz (83.689 kg)  SpO2: 97%     PHYSICAL EXAM: Well nourished, well developed, in no acute distress HEENT: normal Neck: Minimally elevated JVP Cardiac:  normal S1, S2; RRR; no murmur; No S3 Lungs:  clear to auscultation bilaterally, no rhonchi or rales, faint expiratory wheezing noted Abd: soft, nontender, no hepatomegaly Ext: no edema Skin: warm and dry Neuro:  CNs 2-12  intact, no focal abnormalities noted Psych: Normal affect  09/27/2011  Sodium: 141 Potassium: 4.0 Chloride: 109 CO2: 21 BUN: 11 Creat: 1.14   ASSESSMENT AND PLAN:

## 2011-10-11 ENCOUNTER — Other Ambulatory Visit: Payer: Self-pay | Admitting: *Deleted

## 2011-10-11 DIAGNOSIS — B20 Human immunodeficiency virus [HIV] disease: Secondary | ICD-10-CM

## 2011-10-11 DIAGNOSIS — F32A Depression, unspecified: Secondary | ICD-10-CM

## 2011-10-11 DIAGNOSIS — F329 Major depressive disorder, single episode, unspecified: Secondary | ICD-10-CM

## 2011-10-11 DIAGNOSIS — B009 Herpesviral infection, unspecified: Secondary | ICD-10-CM

## 2011-10-11 MED ORDER — RALTEGRAVIR POTASSIUM 400 MG PO TABS: 400.0000 mg | ORAL_TABLET | Freq: Two times a day (BID) | ORAL | Status: DC

## 2011-10-11 MED ORDER — VALACYCLOVIR HCL 500 MG PO TABS: 500.0000 mg | ORAL_TABLET | Freq: Two times a day (BID) | ORAL | Status: DC

## 2011-10-11 MED ORDER — FLUOXETINE HCL 40 MG PO CAPS: 40.0000 mg | ORAL_CAPSULE | Freq: Every day | ORAL | Status: DC

## 2011-10-11 MED ORDER — EMTRICITABINE-TENOFOVIR DF 200-300 MG PO TABS: 1.0000 | ORAL_TABLET | Freq: Every day | ORAL | Status: DC

## 2011-10-15 ENCOUNTER — Inpatient Hospital Stay (HOSPITAL_COMMUNITY)
Admission: EM | Admit: 2011-10-15 | Discharge: 2011-10-19 | DRG: 292 | Disposition: A | Discharge status: Home / Self Care | Payer: Self-pay | Attending: Internal Medicine | Admitting: Internal Medicine

## 2011-10-15 DIAGNOSIS — F4310 Post-traumatic stress disorder, unspecified: Secondary | ICD-10-CM | POA: Diagnosis present

## 2011-10-15 DIAGNOSIS — I4891 Unspecified atrial fibrillation: Secondary | ICD-10-CM | POA: Diagnosis present

## 2011-10-15 DIAGNOSIS — I5023 Acute on chronic systolic (congestive) heart failure: Principal | ICD-10-CM | POA: Diagnosis present

## 2011-10-15 DIAGNOSIS — A6000 Herpesviral infection of urogenital system, unspecified: Secondary | ICD-10-CM | POA: Diagnosis present

## 2011-10-15 DIAGNOSIS — R0789 Other chest pain: Secondary | ICD-10-CM | POA: Diagnosis present

## 2011-10-15 DIAGNOSIS — I509 Heart failure, unspecified: Secondary | ICD-10-CM | POA: Diagnosis present

## 2011-10-15 DIAGNOSIS — I428 Other cardiomyopathies: Secondary | ICD-10-CM | POA: Diagnosis present

## 2011-10-15 DIAGNOSIS — I10 Essential (primary) hypertension: Secondary | ICD-10-CM | POA: Diagnosis present

## 2011-10-15 DIAGNOSIS — J441 Chronic obstructive pulmonary disease with (acute) exacerbation: Secondary | ICD-10-CM | POA: Diagnosis present

## 2011-10-15 DIAGNOSIS — Z7982 Long term (current) use of aspirin: Secondary | ICD-10-CM

## 2011-10-15 DIAGNOSIS — F17200 Nicotine dependence, unspecified, uncomplicated: Secondary | ICD-10-CM | POA: Diagnosis present

## 2011-10-15 DIAGNOSIS — F3289 Other specified depressive episodes: Secondary | ICD-10-CM | POA: Diagnosis present

## 2011-10-15 DIAGNOSIS — J309 Allergic rhinitis, unspecified: Secondary | ICD-10-CM | POA: Diagnosis present

## 2011-10-15 DIAGNOSIS — R079 Chest pain, unspecified: Secondary | ICD-10-CM

## 2011-10-15 DIAGNOSIS — F1010 Alcohol abuse, uncomplicated: Secondary | ICD-10-CM | POA: Diagnosis present

## 2011-10-15 DIAGNOSIS — E876 Hypokalemia: Secondary | ICD-10-CM | POA: Diagnosis present

## 2011-10-15 DIAGNOSIS — Z79899 Other long term (current) drug therapy: Secondary | ICD-10-CM

## 2011-10-15 DIAGNOSIS — E785 Hyperlipidemia, unspecified: Secondary | ICD-10-CM | POA: Diagnosis present

## 2011-10-15 DIAGNOSIS — Z21 Asymptomatic human immunodeficiency virus [HIV] infection status: Secondary | ICD-10-CM | POA: Diagnosis present

## 2011-10-15 DIAGNOSIS — F329 Major depressive disorder, single episode, unspecified: Secondary | ICD-10-CM | POA: Diagnosis present

## 2011-10-16 ENCOUNTER — Encounter: Payer: Self-pay | Admitting: Internal Medicine

## 2011-10-16 ENCOUNTER — Emergency Department (HOSPITAL_COMMUNITY): Payer: Self-pay

## 2011-10-16 LAB — BASIC METABOLIC PANEL
BUN: 13 mg/dL (ref 6–23)
Chloride: 104 mEq/L (ref 96–112)
Creatinine, Ser: 1.04 mg/dL (ref 0.50–1.35)
GFR calc non Af Amer: 82 mL/min — ABNORMAL LOW (ref >90)
Glucose, Bld: 93 mg/dL (ref 70–99)
Potassium: 3.5 mEq/L (ref 3.5–5.1)

## 2011-10-16 LAB — DIFFERENTIAL
Eosinophils Absolute: 0 10*3/uL (ref 0.0–0.7)
Eosinophils Relative: 1 % (ref 0–5)
Lymphocytes Relative: 55 % — ABNORMAL HIGH (ref 12–46)
Lymphs Abs: 2.1 10*3/uL (ref 0.7–4.0)
Monocytes Absolute: 0.4 10*3/uL (ref 0.1–1.0)
Monocytes Relative: 9 % (ref 3–12)

## 2011-10-16 LAB — CBC
HCT: 48.7 % (ref 39.0–52.0)
MCH: 33.9 pg (ref 26.0–34.0)
MCV: 93.3 fL (ref 78.0–100.0)
RDW: 13 % (ref 11.5–15.5)
WBC: 3.8 10*3/uL — ABNORMAL LOW (ref 4.0–10.5)

## 2011-10-16 LAB — POCT I-STAT, CHEM 8
BUN: 14 mg/dL (ref 6–23)
Calcium, Ion: 1.04 mmol/L — ABNORMAL LOW (ref 1.12–1.32)
Creatinine, Ser: 1 mg/dL (ref 0.50–1.35)
Glucose, Bld: 97 mg/dL (ref 70–99)
Hemoglobin: 17.7 g/dL — ABNORMAL HIGH (ref 13.0–17.0)
TCO2: 20 mmol/L (ref 0–100)

## 2011-10-16 LAB — CARDIAC PANEL(CRET KIN+CKTOT+MB+TROPI)
CK, MB: 2.4 ng/mL (ref 0.3–4.0)
Relative Index: 1.8 (ref 0.0–2.5)
Troponin I: <0.3 ng/mL (ref <0.30)

## 2011-10-16 LAB — RAPID URINE DRUG SCREEN, HOSP PERFORMED
Amphetamines: NOT DETECTED
Barbiturates: NOT DETECTED
Opiates: NOT DETECTED
Tetrahydrocannabinol: NOT DETECTED

## 2011-10-16 NOTE — H&P (Signed)
Hospital Admission Note Date: 10/16/2011  Patient name: Ryan Long Medical record number: [id number] Date of birth: 10-19-1961 Age: 50 y.o. Gender: male PCP: Staci Righter, MD, MD  Medical Service: Internal Medicine Teaching Service  Attending physician: Dr. Cliffton Asters    1st Contact: Dr. Janalyn Harder  Pager: [phone] 2nd Contact: Dr. Johnette Abraham Pager: [phone] After 5 pm or weekends: 1st Contact:      Pager: [phone] 2nd Contact:      Pager: [phone]  Chief Complaint: Chest Pain  History of Present Illness:Patient is a 50 yo man with h/o HIV, systolic HF, atrial fibrillation that presents with 7/10, non-radiating, worsening, intermittent substernal pressure like CP x about 1 week accompanied with dyspnea, palpitations & occasional light-headedness/dizziness, but no syncope.  Dizziness occurs when moving from being supine to sitting or standing & he noticed blurry vision for the first time in the ED today during an episode.  SOB worsens with laying down, and he has recently had to double up his pillow.  He reports that his Significant Other forced him to come to hospital today.  He notes that pain is similar to the pain he felt during 2 prior hospitalizations for heart failure.  He has not taken heart medications since October 02, 2011 due to financial constraints.  Pain lasts 5-10 minutes, and frequency of episodes is increasing.  He reports associated nausea but no vomiting.  He has felt sweating & chills, but has not checked his temperature with a thermometer.  He reports increased cough with white sputum production.  His girlfriend had been sick with bronchitis 2 weeks prior.  Pt does not have DOE at baseline, though currently, CP occurs at rest.  Meds: Current Outpatient Prescriptions  Medication Sig Dispense Refill  . aspirin 81 MG tablet Take 81 mg by mouth daily.        . carvedilol (COREG) 6.25 MG tablet Take 1.5 tablets (9.375 mg total) by mouth 2 (two) times daily with  a meal.  90 tablet  6  . digoxin (LANOXIN) 0.125 MG tablet Take 125 mcg by mouth daily.        Marland Kitchen emtricitabine-tenofovir (TRUVADA) 200-300 MG per tablet Take 1 tablet by mouth daily.  30 tablet  11  . famotidine (PEPCID) 20 MG tablet Take 1 tablet (20 mg total) by mouth 2 (two) times daily.  60 tablet  6  . FLUoxetine (PROZAC) 40 MG capsule Take 1 capsule (40 mg total) by mouth daily.  30 capsule  6  . furosemide (LASIX) 40 MG tablet Take 1.5 tablets (60 mg total) by mouth daily.  45 tablet  6  . lisinopril (PRINIVIL,ZESTRIL) 5 MG tablet Take 5 mg by mouth daily.        . Multiple Vitamin (MULTIVITAMIN) tablet Take 1 tablet by mouth daily.        . potassium chloride SA (K-DUR,KLOR-CON) 20 MEQ tablet Take 1 tablet (20 mEq total) by mouth daily.  30 tablet  6  . raltegravir (ISENTRESS) 400 MG tablet Take 1 tablet (400 mg total) by mouth 2 (two) times daily.  60 tablet  6  . traZODone (DESYREL) 150 MG tablet Take 150 mg by mouth at bedtime.        . valACYclovir (VALTREX) 500 MG tablet Take 1 tablet (500 mg total) by mouth 2 (two) times daily.  60 tablet  6    Allergies: Review of patient's allergies indicates no known allergies.  Past Medical History  Diagnosis Date  . HIV (  human immunodeficiency virus infection) Last CD4 = 510, viral load < 20 (09/27/11)  . Chronic systolic heart failure Last EF 30-35% (July 2012)  . Hypertension   . Genital herpes   . Active smoker   . AR (allergic rhinitis)   . HLD (hyperlipidemia)   . NICM (nonischemic cardiomyopathy)     Echocardiogram 06/28/11: EF 30-35%, mild MR, mild LAE;  No CAD by coronary CT angiogram 3/12 at [facility]  . Marland Kitchen NSVT (nonsustained ventricular tachycardia) Depression    Past Surgical History:none  Family History:  Mom: glaucoma, living (72yo) in [place] Dad: Heart problems, HTN, MI, died in late 56s  Social History  . Marital Status: Single, lives with girlfriend   Social History Main Topics  . Smoking  status: Current Everyday Smoker -- 0.5 packs/day for 30 years    Types: Cigarettes  . Smokeless tobacco: Never Used  . Alcohol Use: Notes increased EtOH use (3-4 beers/day x 1 month) but patient did not want to discuss at this time  . Drug Use: Marijuana   Review of Systems: General: no fevers, changes in weight, changes in appetite Skin: no rash HEENT: no hearing changes, sore throat Pulm: no dyspnea, coughing, wheezing CV: as per HPI Abd: no abdominal pain, vomiting, diarrhea/constipation GU: no dysuria, hematuria, polyuria Ext: no arthralgias, myalgias Neuro: no weakness, numbness, or tingling  Physical Exam: Vitals: T: 98.7     HR:64   BP: 133/85   RR: 16   O2 saturation: 100% RA General: resting in bed, no acute distress HEENT: PERRL, EOMI, no scleral icterus, no conjunctival pallor, conjunctival injection Cardiac: RRR, no rubs, murmurs or gallops, no audible S3,S4, normal S1,S2 Pulm: clear to auscultation bilaterally, moving normal volumes of air, mild expiratory wheezing, initially wheezing was much louder, but thought to be from upper airways, mild bibasilar crackles Abd: soft, nontender, nondistended, obese, BS normoactive Ext: warm and well perfused, no pedal edema, 2+ pedal pulses b/l Neuro: alert and oriented X3, cranial nerves II-XII grossly intact, strength and sensation to light touch equal in bilateral upper and lower extremities  Lab results: Basic Metabolic Panel:  [facility] 10/16/11 0037  NA 141  K 3.4*  CL 110  CO2 --  GLUCOSE 97  BUN 14  CREATININE 1.00  CALCIUM --  MG --  PHOS --   CBC:  Basename 10/16/11 0037 10/16/11 0022  WBC -- 3.8*  NEUTROABS -- 1.3*  HGB 17.7* 17.7*  HCT 52.0 48.7  MCV -- 93.3  PLT -- 160   BNP:  Basename 10/16/11 0022  POCBNP 1105.0*   Imaging results:  10/16/2011  Comparison: 09/20/2011   Findings: The heart size and pulmonary vascularity are normal. The lungs appear clear and expanded without focal air space  disease or consolidation. No blunting of the costophrenic angles.  Mild degenerative changes in the spine.  No significant change since previous study.   IMPRESSION: No evidence of active pulmonary disease.   Other results: EKG: sinus rhythm, nonspecific T wave abnormalities unchanged from prior EKG  Assessment & Plan by Problem:  #Chest Pain: Patient's history suggests that pain is 2/2 to CHF exacerbation, especially given elevated proBNP (1 month prior, pBNP = 28; 3 month prior, pBNP=696).  Patient has history of dilated cardiomyopathy 2/2 previous substance abuse/HTN, unlikely due to HIV, possibly another virus.  In July 2012, CT suggested chronic bronchitis, for which he was also treated in 2010, thus, given increased sputum production, cough & sick contact, patient may also be  suffering with COPD exacerbation that may lead to CP.  Given location & description of pain, MI needs to be ruled out.  Patient has history of cocaine abuse, so vasospasm is possible.  Given report of palpitations, it is reasonable to also rule out hyperthyroidism.  GERD is also a possibility.  Patient's Geneva score is 0, so PE is highly unlikely.  CXR and symptoms do not suggest CP 2/2 PNA. Plan:  Admit to tele, 12 lead EKG in AM Strict I/Os, daily weights CE x 3, UDS AML: BMET, TSH Lasix IV now, then 60mg  PO daily, KCl PO now, then 4 hours after first dose to replete K ASA 81 daily, Pepcid 20 mg BID NTG 0.4mg  SL q12min CP x 3 doses Morphine 1-2 mg IV q4h prn pain Consider repeat echo once patient stabilizes Consider outpatient PFTs, if condition does not improve, consider treating as COPD exacerbation given history of bronchitis  #Polysubstance abuse: Patient has a history of cocaine & marijuana use, tobacco abuse and EtOH abuse.   Plan: Nicotine patch 14mg  daily, smoking cessation consult, CIWA obs, UDS Will need to continue discussion about alcohol abuse with patient.    #HTN: Continue home  dose of lisinopril & coreg, BP is currently at goal. Continue to monitor  #HIV: Patient's HIV seems to be stable as CD4 count is 510 and viral load undetectable.  Continue ART.  No need for prophylaxis.  #Genital Herpes: Continue valcyclovir  #VTE Prophylaxis: Lovenox 40mg  [place] daily   R3______________________________ (Dr. Nelda Bucks, [phone])   R1________________________________ (Dr. Vernice Jefferson, [phone])  ATTENDING: I performed and/or observed a history and physical examination of the patient.  I discussed the case with the residents as noted and reviewed the residents' notes.  I agree with the findings and plan--please refer to the attending physician note for more details.  Signature________________________________  Printed Name_____________________________

## 2011-10-17 DIAGNOSIS — R079 Chest pain, unspecified: Secondary | ICD-10-CM

## 2011-10-17 LAB — CARDIAC PANEL(CRET KIN+CKTOT+MB+TROPI)
CK, MB: 2.5 ng/mL (ref 0.3–4.0)
Total CK: 117 U/L (ref 7–232)
Troponin I: <0.3 ng/mL (ref <0.30)

## 2011-10-17 LAB — BASIC METABOLIC PANEL
BUN: 17 mg/dL (ref 6–23)
Calcium: 9.2 mg/dL (ref 8.4–10.5)
GFR calc non Af Amer: >90 mL/min (ref >90)
Glucose, Bld: 117 mg/dL — ABNORMAL HIGH (ref 70–99)

## 2011-10-18 DIAGNOSIS — I509 Heart failure, unspecified: Secondary | ICD-10-CM

## 2011-10-18 DIAGNOSIS — J449 Chronic obstructive pulmonary disease, unspecified: Secondary | ICD-10-CM

## 2011-10-18 DIAGNOSIS — J4489 Other specified chronic obstructive pulmonary disease: Secondary | ICD-10-CM

## 2011-10-18 LAB — BASIC METABOLIC PANEL
BUN: 12 mg/dL (ref 6–23)
Chloride: 104 mEq/L (ref 96–112)
Creatinine, Ser: 0.97 mg/dL (ref 0.50–1.35)
GFR calc Af Amer: >90 mL/min (ref >90)
Glucose, Bld: 98 mg/dL (ref 70–99)

## 2011-10-18 LAB — URINALYSIS, ROUTINE W REFLEX MICROSCOPIC
Bilirubin Urine: NEGATIVE
Glucose, UA: NEGATIVE mg/dL
Hgb urine dipstick: NEGATIVE
Specific Gravity, Urine: 1.014 (ref 1.005–1.030)
Urobilinogen, UA: 0.2 mg/dL (ref 0.0–1.0)
pH: 5.5 (ref 5.0–8.0)

## 2011-10-18 LAB — RPR: RPR Ser Ql: NONREACTIVE

## 2011-10-19 LAB — URINE CULTURE
Colony Count: NO GROWTH
Culture  Setup Time: 201210311203
Culture: NO GROWTH

## 2011-10-27 NOTE — Discharge Summary (Signed)
NAMECAILEN, MIHALIK NO.:  [id number]  MEDICAL RECORD NO.:  [id number]  LOCATION:  [facility]                         FACILITY:  [facility]  PHYSICIAN:  Cliffton Asters, M.D.    DATE OF BIRTH:  [birth year]  DATE OF ADMISSION:  10/15/2011 DATE OF DISCHARGE:  10/19/2011                              DISCHARGE SUMMARY   DISCHARGE DIAGNOSES: 1. Congestive heart failure exacerbation, ejection fraction equals 30-     35%, out of medications for 2 weeks prior to admission. 2. Chronic obstructive pulmonary disease exacerbation, new diagnosis,     no prior treatment, responded to albuterol and ipratropium. 3. Human immunodeficiency virus, CD4 equals 510. 4. Hypertension. 5. Tobacco abuse.  DISCHARGE MEDICATIONS: 1. Albuterol 90 mcg inhaler 2 puffs inhaled every 4 hours as needed. 2. Nicotine 21 mg per 24-hour patch daily. 3. Carvedilol 6.25 mg, take 1-1/2 tablets by mouth twice daily. 4. Famotidine 20 mg 1 tablet by mouth daily. 5. Aspirin 81 mg 1 tablet by mouth daily. 6. Fluoxetine 40 mg 1 tablet by mouth daily for depression. 7. Furosemide 40 mg 1-1/2 tablets by mouth daily. 8. Digoxin 0.125 mg 1 tablet by mouth daily. 9. Lisinopril 5 mg 1 tablet by mouth daily. 10.Multivitamin 1 tablet by mouth daily. 11.Potassium chloride 20 mEq 1 tablet by mouth daily. 12.Raltegravir 400 mg 1 tablet by mouth twice daily. 13.Truvada 200-300 mg 1 tablet by mouth daily. 14.Trazodone 150 mg 1 tablet by mouth daily at bedtime. 15.Valtrex 500 mg 1 tablet by mouth twice daily.  DISPOSITION AND FOLLOWUP:  The patient was discharged from [facility] in stable and improved condition on October 19, 2011, with resolution of chest pain and shortness of breath.  The patient will follow up with his primary care provider, Dr. Earmon Phoenix on October 27, 2011, at 12 p.m.  At that visit, the patient will need a basic metabolic panel to ensure resolution of hypokalemia seen in hospital especially  in the context of restarting many of the patient's medications, which he previously ran out of.  PROCEDURES PERFORMED:  Chest x-ray, no evidence of pulmonary disease.  CONSULTATIONS:  None.  ADMITTING HISTORY AND PHYSICAL:  The patient is a 50 year old man with a history of HIV, systolic heart failure, hypertension, presenting with 7/10 non-radiating, worsening, intermittent substernal pressure-like chest pain for about 1 week accompanied with dyspnea, palpitations and occasional lightheadedness/dizziness, but no syncope.  Dizziness occurs when moving from being supine to sitting or standing, and he noticed blurry vision for the first time in the ED today during an episode, shortness of breath worsens when lying down, and he has recently had to double up his pillow.  He reports that his significant other forced him to come to the hospital today.  He notes that his pain is similar to the pain he felt during 2 prior hospitalizations for heart failure.  He has not taken his heart medication since October 02, 2011, due to financial constraints.  Pain lasts 5-10 minutes and the frequency of episodes is increasing to 2-3 times a day.  He reports associated nausea, but no vomiting.  He has felt sweating and chills, but has not checked his temperature  with a thermometer.  He reports increased cough with white sputum production.  His girlfriend had been sick with bronchitis 2 weeks prior.  The patient does not have dyspnea on exertion at baseline, though currently chest pain is occurring at rest.  PHYSICAL EXAMINATION:  VITAL SIGNS:  Temperature 98.7, blood pressure 133/85, heart rate 64, respirations 16, O2 saturation 100% on room air. GENERAL:  Alert, resting in bed, no acute distress. HEENT:  Pupils are equal, round, and reactive to light.  Extraocular movements intact.  Oropharynx is moist and nonerythematous. CARDIAC:  Regular rate and rhythm.  No murmurs, rubs, or gallops.  No audible  S3, S4. PULMONARY:  Clear to auscultation bilaterally, moving normal volumes to the air.  Moderate expiratory wheezing, mild bibasilar crackles. ABDOMEN:  Soft, nontender, nondistended.  Bowel sounds normoactive. EXTREMITIES:  Warm and well perfused.  No pedal edema.  2+ pedal pulses. NEUROLOGIC:  Alert and oriented x3.  Cranial nerves II through XII grossly intact.  Strength and sensation to light touch equal in bilateral upper and lower extremities.  ADMITTING LABORATORY DATA:  Sodium 141, potassium 3.4, chloride 110, BUN 14, creatinine 1.0, glucose 97.  WBC 3.8, hemoglobin 17.7, hematocrit 48.7, platelets 168.  Pro-BNP 1105.  HOSPITAL COURSE: 1. Congestive heart failure exacerbation.  The patient presented to     the emergency department after being out of his medications for     heart failure for the last 2 weeks.  The patient presented with     chest pain and shortness of breath, which he describes as similar     to his previous episodes of congestive heart failure exacerbation.     The patient recently noted having to use an extra pillow to sleep     at night, though he notes no paroxysmal nocturnal dyspnea.  Onadmission, the patient's pro-BNP was found to be 1105, increased     from a previous Pro-BNP level of 696 in July.  The patient's     symptoms were thought to be contributed to by a congestive heart failure     exacerbation and the patient was restarted on his Lasix,     carvedilol, and lisinopril as well as digoxin. 2. Chronic obstructive pulmonary disease exacerbation.  The patient     notes a 30-pack-year history of smoking, though he notes no prior     lung disease in the past.  The patient presented with cough     productive of white sputum, which had been present for the last     couple of weeks accompanied by his chest pain and shortness of     breath.  On physical exam, significant wheezing was heard in both     lungs bilaterally.  The patient was given treatments  with albuterol     and ipratropium, and the patient's wheezing significantly improved     and had resolved by the second day of hospitalization.  The patient     notes never using an inhaler previously.  The patient was     discharged with an albuterol inhaler to use as needed for his     symptoms in the future.  If his symptoms progress or become more     severe, a long-acting beta-agonist can be tried next.  The patient     was also counseled on the importance of smoking cessation and was     given the 21 mcg nicotine patch and encouraged to quit smoking.     The  patient appeared receptive to this advice and agreed that he     also wanted to stop smoking. 3. Tobacco abuse.  As above, the patient has a 30-pack-year history of     smoking, but is currently trying to quit with the nicotine patch.     This issue can be followed up at the patient's primary care office. 4. Human immunodeficiency virus.  The patient has a history of human     immunodeficiency virus with his last CD4 count of 510, currently on     anti-retroviral therapy.  The patient's anti-retroviral therapy was     continued throughout hospitalization. 5. Genital herpes.  The patient has a history of genital herpes, not     active during this hospitalization.  The patient currently takes     valacyclovir and this medication was continued throughout     hospitalization and to hospital discharge. 6. Financial constraints.  The patient notes current financial     difficulties and Inpatient Social Work was largely instrumental in     obtaining resources for this patient.  The patient had only     recently moved to [place] within the last year and had     previously lived and worked in [place], [place].  During     this hospitalization, the patient contacted his prior employer in     [place] and is currently in the process of obtaining part-time work     at his old place of employment.  We are able to obtain     a bus  pass ticket to [facility] where he could stay in [place]     until he obtained the job and could find more permanent housing     and a temporary supply of medications for free at hospital     discharge to hold him over until he had enough money to fill     the prescriptions given to him at hospital discharge and to obtain     refills from his primary care provider.  DISCHARGE VITAL SIGNS:  Temperature 97.7, blood pressure 100/69, pulse 79, respirations 20, O2 saturation 98% on room air.  DISCHARGE LABORATORY DATA:  Sodium 137, potassium 3.4, chloride 104, bicarb 25, BUN 12, creatinine 0.97, glucose 98.  RPR negative. Gonorrhea/Chlamydia negative.  Urinalysis normal.    ______________________________ Janalyn Harder, MD   ______________________________ Cliffton Asters, M.D.    RB/MEDQ  D:  10/19/2011  T:  10/20/2011  Job:  [id number]  cc:   Lowella Petties. Earmon Phoenix, MD  Electronically Signed by Janalyn Harder MD on 10/25/2011 12:17:57 PM Electronically Signed by Cliffton Asters M.D. on 10/27/2011 10:25:41 AM

## 2011-10-31 ENCOUNTER — Encounter (HOSPITAL_COMMUNITY): Payer: Self-pay

## 2011-11-02 ENCOUNTER — Encounter (HOSPITAL_COMMUNITY): Payer: Self-pay

## 2011-11-08 ENCOUNTER — Ambulatory Visit: Payer: Self-pay | Admitting: Internal Medicine

## 2011-11-12 ENCOUNTER — Emergency Department (HOSPITAL_COMMUNITY)
Admission: EM | Admit: 2011-11-12 | Discharge: 2011-11-12 | Disposition: A | Discharge status: Home / Self Care | Payer: Self-pay | Attending: Emergency Medicine | Admitting: Emergency Medicine

## 2011-11-12 ENCOUNTER — Encounter (HOSPITAL_COMMUNITY): Payer: Self-pay

## 2011-11-12 DIAGNOSIS — F17200 Nicotine dependence, unspecified, uncomplicated: Secondary | ICD-10-CM | POA: Insufficient documentation

## 2011-11-12 DIAGNOSIS — N342 Other urethritis: Secondary | ICD-10-CM | POA: Insufficient documentation

## 2011-11-12 DIAGNOSIS — R300 Dysuria: Secondary | ICD-10-CM | POA: Insufficient documentation

## 2011-11-12 DIAGNOSIS — F319 Bipolar disorder, unspecified: Secondary | ICD-10-CM | POA: Insufficient documentation

## 2011-11-12 DIAGNOSIS — A6000 Herpesviral infection of urogenital system, unspecified: Secondary | ICD-10-CM | POA: Insufficient documentation

## 2011-11-12 DIAGNOSIS — R369 Urethral discharge, unspecified: Secondary | ICD-10-CM | POA: Insufficient documentation

## 2011-11-12 DIAGNOSIS — Z202 Contact with and (suspected) exposure to infections with a predominantly sexual mode of transmission: Secondary | ICD-10-CM | POA: Insufficient documentation

## 2011-11-12 DIAGNOSIS — E785 Hyperlipidemia, unspecified: Secondary | ICD-10-CM | POA: Insufficient documentation

## 2011-11-12 DIAGNOSIS — I5022 Chronic systolic (congestive) heart failure: Secondary | ICD-10-CM | POA: Insufficient documentation

## 2011-11-12 DIAGNOSIS — Z21 Asymptomatic human immunodeficiency virus [HIV] infection status: Secondary | ICD-10-CM | POA: Insufficient documentation

## 2011-11-12 DIAGNOSIS — I10 Essential (primary) hypertension: Secondary | ICD-10-CM | POA: Insufficient documentation

## 2011-11-12 HISTORY — DX: Depression, unspecified: F32.A

## 2011-11-12 HISTORY — DX: Bipolar disorder, unspecified: F31.9

## 2011-11-12 HISTORY — DX: Major depressive disorder, single episode, unspecified: F32.9

## 2011-11-12 LAB — URINE MICROSCOPIC-ADD ON: Urine-Other: NONE SEEN

## 2011-11-12 LAB — URINALYSIS, ROUTINE W REFLEX MICROSCOPIC
Bilirubin Urine: NEGATIVE
Glucose, UA: NEGATIVE mg/dL
Hgb urine dipstick: NEGATIVE
Ketones, ur: 15 mg/dL — AB
Protein, ur: NEGATIVE mg/dL
pH: 6 (ref 5.0–8.0)

## 2011-11-12 MED ORDER — AZITHROMYCIN 250 MG PO TABS
1000.0000 mg | ORAL_TABLET | Freq: Once | ORAL | Status: AC
  Administered 2011-11-12: 1000 mg via ORAL
  Filled 2011-11-12: qty 4

## 2011-11-12 MED ORDER — LIDOCAINE HCL (PF) 1 % IJ SOLN
1.0000 mL | Freq: Once | INTRAMUSCULAR | Status: AC
  Administered 2011-11-12: 5 mL via INTRADERMAL
  Filled 2011-11-12: qty 5

## 2011-11-12 MED ORDER — CEFTRIAXONE SODIUM 250 MG IJ SOLR
250.0000 mg | Freq: Once | INTRAMUSCULAR | Status: AC
  Administered 2011-11-12: 250 mg via INTRAMUSCULAR
  Filled 2011-11-12: qty 250

## 2011-11-12 NOTE — ED Provider Notes (Signed)
History     CSN: [id number] Arrival date & time: 11/12/2011  1:12 PM   First MD Initiated Contact with Patient 11/12/11 1434      Chief Complaint  Patient presents with  . Exposure to STD    (Consider location/radiation/quality/duration/timing/severity/associated sxs/prior treatment) Patient is a 50 y.o. male presenting with STD exposure. The history is provided by the patient.  Exposure to STD This is a new problem. The current episode started in the past 7 days. The problem has been unchanged. Associated symptoms include urinary symptoms. Pertinent negatives include no abdominal pain, chills, fever, nausea, rash, vomiting or weakness. The symptoms are aggravated by nothing. He has tried nothing for the symptoms.  Pt states his girlfriend told him that he needs to be tested for STDs. States last intercourse with her a week ago. States that yesterday developed burning with urination, clear penile discharge. Denies fever, chills malaise.  Past Medical History  Diagnosis Date  . HIV (human immunodeficiency virus infection)   . Chronic systolic heart failure   . Hypertension   . Genital herpes   . Active smoker   . AR (allergic rhinitis)   . HLD (hyperlipidemia)   . NICM (nonischemic cardiomyopathy)     Echocardiogram 06/28/11: EF 30-35%, mild MR, mild LAE;  No CAD by coronary CT angiogram 3/12 at [facility]  . NSVT (nonsustained ventricular tachycardia)   . Depression   . Bipolar 1 disorder     History reviewed. No pertinent past surgical history.  History reviewed. No pertinent family history.  History  Substance Use Topics  . Smoking status: Current Everyday Smoker -- 0.8 packs/day for 30 years    Types: Cigarettes  . Smokeless tobacco: Never Used  . Alcohol Use: No      Review of Systems  Constitutional: Negative for fever and chills.  HENT: Negative.   Eyes: Negative.   Respiratory: Negative.   Cardiovascular: Negative.   Gastrointestinal:  Negative for nausea, vomiting and abdominal pain.  Genitourinary: Positive for dysuria and discharge. Negative for frequency, hematuria, flank pain and testicular pain.  Musculoskeletal: Negative.   Skin: Negative for rash.  Neurological: Negative.  Negative for weakness.  Psychiatric/Behavioral: Negative.     Allergies  Review of patient's allergies indicates no known allergies.  Home Medications   Current Outpatient Rx  Name Route Sig Dispense Refill  . ASPIRIN 81 MG PO TABS Oral Take 81 mg by mouth daily.      Marland Kitchen CARVEDILOL 6.25 MG PO TABS Oral Take 1.5 tablets (9.375 mg total) by mouth 2 (two) times daily with a meal. 90 tablet 6  . DIGOXIN 0.125 MG PO TABS Oral Take 125 mcg by mouth daily.      Marland Kitchen EMTRICITABINE-TENOFOVIR 200-300 MG PO TABS Oral Take 1 tablet by mouth daily. 30 tablet 11  . FAMOTIDINE 20 MG PO TABS Oral Take 1 tablet (20 mg total) by mouth 2 (two) times daily. 60 tablet 6  . FLUOXETINE HCL 40 MG PO CAPS Oral Take 1 capsule (40 mg total) by mouth daily. 30 capsule 6  . FUROSEMIDE 40 MG PO TABS Oral Take 1.5 tablets (60 mg total) by mouth daily. 45 tablet 6  . LISINOPRIL 5 MG PO TABS Oral Take 5 mg by mouth daily.      Marland Kitchen ONE-DAILY MULTI VITAMINS PO TABS Oral Take 1 tablet by mouth daily.      Marland Kitchen POTASSIUM CHLORIDE CRYS CR 20 MEQ PO TBCR Oral Take 1 tablet (20 mEq  total) by mouth daily. 30 tablet 6  . RALTEGRAVIR POTASSIUM 400 MG PO TABS Oral Take 1 tablet (400 mg total) by mouth 2 (two) times daily. 60 tablet 6  . TRAZODONE HCL 150 MG PO TABS Oral Take 150 mg by mouth at bedtime.      Marland Kitchen VALACYCLOVIR HCL 500 MG PO TABS Oral Take 1 tablet (500 mg total) by mouth 2 (two) times daily. 60 tablet 6    BP 137/82  Pulse 85  Temp(Src) 98.1 F (36.7 C) (Oral)  Resp 20  SpO2 100%  Physical Exam  Constitutional: He is oriented to person, place, and time. He appears well-developed and well-nourished.  Eyes: Conjunctivae are normal.  Neck: Normal range of motion. Neck  supple.  Cardiovascular: Normal rate, regular rhythm and normal heart sounds.   Pulmonary/Chest: Effort normal and breath sounds normal.  Abdominal: Soft. Bowel sounds are normal. There is no tenderness.  Genitourinary: Penis normal. No penile tenderness.       No penile discharge  Musculoskeletal: Normal range of motion.  Neurological: He is alert and oriented to person, place, and time.  Skin: Skin is warm and dry.  Psychiatric: He has a normal mood and affect.    ED Course  Procedures (including critical care time)  Labs Reviewed  URINALYSIS, ROUTINE W REFLEX MICROSCOPIC - Abnormal; Notable for the following:    Ketones, ur 15 (*)    Leukocytes, UA TRACE (*)    All other components within normal limits  URINE MICROSCOPIC-ADD ON  GC/CHLAMYDIA PROBE AMP, GENITAL   Pt treated for STI due to his symptoms. Cultures pending, UA unremarkable. Will d/c home  1. Urethritis       MDM          Lottie Mussel, PA 11/12/11 1635

## 2011-11-12 NOTE — ED Notes (Signed)
Pt sexual partner informed him he may have an STD,  (unknown) having discharge, pain with urination, chills and then hot

## 2011-11-13 NOTE — ED Provider Notes (Signed)
Medical screening examination/treatment/procedure(s) were performed by non-physician practitioner and as supervising physician I was immediately available for consultation/collaboration.   Laray Anger, DO 11/13/11 [id number]

## 2011-11-21 ENCOUNTER — Ambulatory Visit (HOSPITAL_COMMUNITY): Payer: Medicaid Other | Attending: Internal Medicine

## 2011-11-22 ENCOUNTER — Other Ambulatory Visit: Payer: Self-pay

## 2011-11-28 ENCOUNTER — Telehealth: Payer: Self-pay | Admitting: *Deleted

## 2011-11-28 NOTE — Telephone Encounter (Signed)
Took a message off the voice mail. [facility] states pt has not picked up his meds in over a week. I gave the pharmacist  the numbers we had so he can contact the pt

## 2011-12-07 ENCOUNTER — Ambulatory Visit: Payer: Self-pay | Admitting: Internal Medicine

## 2011-12-07 ENCOUNTER — Telehealth: Payer: Self-pay | Admitting: *Deleted

## 2011-12-07 NOTE — Telephone Encounter (Signed)
Tried to contact patient about missed appointment and to reschedule. Phone disconnected.

## 2012-02-23 ENCOUNTER — Encounter (HOSPITAL_COMMUNITY): Payer: Self-pay | Admitting: *Deleted

## 2012-03-17 ENCOUNTER — Emergency Department (HOSPITAL_COMMUNITY)
Admission: EM | Admit: 2012-03-17 | Discharge: 2012-03-19 | Disposition: A | Discharge status: Other Acute Inpatient Hospital | Payer: Self-pay | Attending: Emergency Medicine | Admitting: Emergency Medicine

## 2012-03-17 ENCOUNTER — Encounter (HOSPITAL_COMMUNITY): Payer: Self-pay | Admitting: *Deleted

## 2012-03-17 DIAGNOSIS — F3130 Bipolar disorder, current episode depressed, mild or moderate severity, unspecified: Secondary | ICD-10-CM | POA: Insufficient documentation

## 2012-03-17 DIAGNOSIS — F1010 Alcohol abuse, uncomplicated: Secondary | ICD-10-CM | POA: Insufficient documentation

## 2012-03-17 DIAGNOSIS — Z79899 Other long term (current) drug therapy: Secondary | ICD-10-CM | POA: Insufficient documentation

## 2012-03-17 DIAGNOSIS — F1410 Cocaine abuse, uncomplicated: Secondary | ICD-10-CM | POA: Insufficient documentation

## 2012-03-17 DIAGNOSIS — E785 Hyperlipidemia, unspecified: Secondary | ICD-10-CM | POA: Insufficient documentation

## 2012-03-17 DIAGNOSIS — Z21 Asymptomatic human immunodeficiency virus [HIV] infection status: Secondary | ICD-10-CM | POA: Insufficient documentation

## 2012-03-17 DIAGNOSIS — Z7982 Long term (current) use of aspirin: Secondary | ICD-10-CM | POA: Insufficient documentation

## 2012-03-17 DIAGNOSIS — I10 Essential (primary) hypertension: Secondary | ICD-10-CM | POA: Insufficient documentation

## 2012-03-17 HISTORY — DX: Alcohol abuse, uncomplicated: F10.10

## 2012-03-17 HISTORY — DX: Cocaine use, unspecified, uncomplicated: F14.90

## 2012-03-17 LAB — DIFFERENTIAL
Eosinophils Absolute: 0 10*3/uL (ref 0.0–0.7)
Eosinophils Relative: 1 % (ref 0–5)
Lymphocytes Relative: 46 % (ref 12–46)
Lymphs Abs: 2.1 10*3/uL (ref 0.7–4.0)
Monocytes Absolute: 0.5 10*3/uL (ref 0.1–1.0)

## 2012-03-17 LAB — ETHANOL: Alcohol, Ethyl (B): <11 mg/dL (ref 0–11)

## 2012-03-17 LAB — COMPREHENSIVE METABOLIC PANEL
CO2: 23 mEq/L (ref 19–32)
Calcium: 9.1 mg/dL (ref 8.4–10.5)
Creatinine, Ser: 0.99 mg/dL (ref 0.50–1.35)
GFR calc Af Amer: >90 mL/min (ref >90)
GFR calc non Af Amer: >90 mL/min (ref >90)
Glucose, Bld: 75 mg/dL (ref 70–99)
Total Protein: 7.2 g/dL (ref 6.0–8.3)

## 2012-03-17 LAB — URINE MICROSCOPIC-ADD ON

## 2012-03-17 LAB — URINALYSIS, ROUTINE W REFLEX MICROSCOPIC
Bilirubin Urine: NEGATIVE
Glucose, UA: NEGATIVE mg/dL
Protein, ur: 30 mg/dL — AB
Urobilinogen, UA: 1 mg/dL (ref 0.0–1.0)

## 2012-03-17 LAB — CBC
HCT: 48.8 % (ref 39.0–52.0)
MCH: 34 pg (ref 26.0–34.0)
MCV: 92.6 fL (ref 78.0–100.0)
RBC: 5.27 MIL/uL (ref 4.22–5.81)
RDW: 13.4 % (ref 11.5–15.5)
WBC: 4.6 10*3/uL (ref 4.0–10.5)

## 2012-03-17 LAB — RAPID URINE DRUG SCREEN, HOSP PERFORMED
Amphetamines: NOT DETECTED
Cocaine: POSITIVE — AB
Opiates: NOT DETECTED
Tetrahydrocannabinol: NOT DETECTED

## 2012-03-17 MED ORDER — FUROSEMIDE 20 MG PO TABS
40.0000 mg | ORAL_TABLET | Freq: Every day | ORAL | Status: DC
  Administered 2012-03-17 – 2012-03-19 (×3): 40 mg via ORAL
  Filled 2012-03-17 (×3): qty 2

## 2012-03-17 MED ORDER — LISINOPRIL 5 MG PO TABS
5.0000 mg | ORAL_TABLET | Freq: Every day | ORAL | Status: DC
  Administered 2012-03-18: 5 mg via ORAL
  Filled 2012-03-17 (×2): qty 1

## 2012-03-17 MED ORDER — TRAZODONE HCL 150 MG PO TABS
150.0000 mg | ORAL_TABLET | ORAL | Status: AC
  Administered 2012-03-18: 150 mg via ORAL
  Filled 2012-03-17: qty 1

## 2012-03-17 MED ORDER — EMTRICITABINE-TENOFOVIR DF 200-300 MG PO TABS
1.0000 | ORAL_TABLET | ORAL | Status: AC
  Administered 2012-03-18: 1 via ORAL
  Filled 2012-03-17: qty 1

## 2012-03-17 MED ORDER — ACETAMINOPHEN 325 MG PO TABS
650.0000 mg | ORAL_TABLET | ORAL | Status: DC | PRN
  Administered 2012-03-18 – 2012-03-19 (×3): 650 mg via ORAL
  Filled 2012-03-17 (×3): qty 2

## 2012-03-17 MED ORDER — BUPROPION HCL ER (XL) 150 MG PO TB24
150.0000 mg | ORAL_TABLET | ORAL | Status: AC
  Administered 2012-03-18: 150 mg via ORAL
  Filled 2012-03-17: qty 1

## 2012-03-17 MED ORDER — NICOTINE 21 MG/24HR TD PT24
21.0000 mg | MEDICATED_PATCH | Freq: Every day | TRANSDERMAL | Status: DC
  Administered 2012-03-17 – 2012-03-19 (×3): 21 mg via TRANSDERMAL
  Filled 2012-03-17 (×3): qty 1

## 2012-03-17 MED ORDER — ASPIRIN 81 MG PO TABS
81.0000 mg | ORAL_TABLET | Freq: Every day | ORAL | Status: DC
  Administered 2012-03-18: 81 mg via ORAL

## 2012-03-17 MED ORDER — CARVEDILOL 6.25 MG PO TABS
6.2500 mg | ORAL_TABLET | Freq: Two times a day (BID) | ORAL | Status: DC
  Administered 2012-03-18 – 2012-03-19 (×3): 6.25 mg via ORAL
  Filled 2012-03-17 (×6): qty 1

## 2012-03-17 MED ORDER — VALACYCLOVIR HCL 500 MG PO TABS
500.0000 mg | ORAL_TABLET | Freq: Two times a day (BID) | ORAL | Status: DC
  Administered 2012-03-18 – 2012-03-19 (×3): 500 mg via ORAL
  Filled 2012-03-17 (×4): qty 1

## 2012-03-17 MED ORDER — ONDANSETRON HCL 8 MG PO TABS
4.0000 mg | ORAL_TABLET | Freq: Three times a day (TID) | ORAL | Status: DC | PRN
  Administered 2012-03-18: 4 mg via ORAL

## 2012-03-17 MED ORDER — RALTEGRAVIR POTASSIUM 400 MG PO TABS
400.0000 mg | ORAL_TABLET | ORAL | Status: AC
  Administered 2012-03-18: 400 mg via ORAL
  Filled 2012-03-17: qty 1

## 2012-03-17 MED ORDER — EMTRICITABINE-TENOFOVIR DF 200-300 MG PO TABS
1.0000 | ORAL_TABLET | Freq: Every day | ORAL | Status: DC
  Administered 2012-03-18 – 2012-03-19 (×2): 1 via ORAL
  Filled 2012-03-17 (×2): qty 1

## 2012-03-17 MED ORDER — FAMOTIDINE 20 MG PO TABS
20.0000 mg | ORAL_TABLET | Freq: Two times a day (BID) | ORAL | Status: DC
  Administered 2012-03-18 – 2012-03-19 (×4): 20 mg via ORAL
  Filled 2012-03-17 (×4): qty 1

## 2012-03-17 MED ORDER — TRAZODONE HCL 150 MG PO TABS
150.0000 mg | ORAL_TABLET | Freq: Every day | ORAL | Status: DC
  Administered 2012-03-18: 150 mg via ORAL
  Filled 2012-03-17 (×3): qty 1

## 2012-03-17 MED ORDER — RALTEGRAVIR POTASSIUM 400 MG PO TABS
400.0000 mg | ORAL_TABLET | Freq: Two times a day (BID) | ORAL | Status: DC
  Administered 2012-03-18 – 2012-03-19 (×3): 400 mg via ORAL
  Filled 2012-03-17 (×4): qty 1

## 2012-03-17 MED ORDER — VALACYCLOVIR HCL 500 MG PO TABS
500.0000 mg | ORAL_TABLET | ORAL | Status: AC
  Administered 2012-03-18: 500 mg via ORAL
  Filled 2012-03-17: qty 1

## 2012-03-17 MED ORDER — LORAZEPAM 1 MG PO TABS
1.0000 mg | ORAL_TABLET | Freq: Three times a day (TID) | ORAL | Status: DC | PRN
  Administered 2012-03-18 (×2): 1 mg via ORAL
  Filled 2012-03-17 (×2): qty 1

## 2012-03-17 MED ORDER — ALUM & MAG HYDROXIDE-SIMETH 200-200-20 MG/5ML PO SUSP
30.0000 mL | ORAL | Status: DC | PRN
  Administered 2012-03-19: 30 mL via ORAL
  Filled 2012-03-17: qty 30

## 2012-03-17 MED ORDER — DIGOXIN 125 MCG PO TABS
125.0000 ug | ORAL_TABLET | Freq: Every day | ORAL | Status: DC
  Administered 2012-03-17 – 2012-03-19 (×3): 125 ug via ORAL
  Filled 2012-03-17 (×3): qty 1

## 2012-03-17 MED ORDER — POTASSIUM CHLORIDE CRYS ER 20 MEQ PO TBCR
20.0000 meq | EXTENDED_RELEASE_TABLET | Freq: Every day | ORAL | Status: DC
  Administered 2012-03-17 – 2012-03-19 (×3): 20 meq via ORAL
  Filled 2012-03-17 (×3): qty 1

## 2012-03-17 MED ORDER — LISINOPRIL 5 MG PO TABS
5.0000 mg | ORAL_TABLET | ORAL | Status: AC
  Administered 2012-03-18: 5 mg via ORAL
  Filled 2012-03-17: qty 1

## 2012-03-17 MED ORDER — CARVEDILOL 6.25 MG PO TABS
6.2500 mg | ORAL_TABLET | ORAL | Status: AC
  Administered 2012-03-18: 6.25 mg via ORAL
  Filled 2012-03-17: qty 1

## 2012-03-17 MED ORDER — ZOLPIDEM TARTRATE 5 MG PO TABS: 5.0000 mg | ORAL_TABLET | Freq: Every evening | ORAL | Status: DC | PRN

## 2012-03-17 MED ORDER — IBUPROFEN 200 MG PO TABS: 600.0000 mg | ORAL_TABLET | Freq: Three times a day (TID) | ORAL | Status: DC | PRN

## 2012-03-17 MED ORDER — BUPROPION HCL ER (XL) 150 MG PO TB24
150.0000 mg | ORAL_TABLET | Freq: Every day | ORAL | Status: DC
  Administered 2012-03-18 – 2012-03-19 (×2): 150 mg via ORAL
  Filled 2012-03-17 (×4): qty 1

## 2012-03-17 NOTE — ED Notes (Signed)
The pt wants detox from drugs and alcohol.  Crack cocaine and pot...  Last alcohol 1300  Cocaine was 1400

## 2012-03-17 NOTE — ED Provider Notes (Signed)
History     CSN: [id number]  Arrival date & time 03/17/12  1900   First MD Initiated Contact with Patient 03/17/12 2104      Chief Complaint  Patient presents with  . wants detocx      (Consider location/radiation/quality/duration/timing/severity/associated sxs/prior treatment) HPI Comments: The patient is a kind and cooperative gentleman who reports years of alcohol and cocaine abuse, with last use of alcohol and cocaine at 2 PM today. He reports that he typically drinks about 2/5 of wine and 224 ounce beers daily. He has presented requesting substance abuse treatment. He denies suicidal or homicidal ideation. He has no symptoms otherwise.  Patient is a 51 y.o. male presenting with drug/alcohol assessment. The history is provided by the patient.  Drug / Alcohol Assessment Primary symptoms include no confusion, no somnolence, no loss of consciousness, no seizures, no weakness, no agitation, no delusions, no hallucinations, no self-injury, no violence, and no intoxication. This is a chronic problem. Episode onset: Many years ago when the patient was 51 years old. The problem has been gradually worsening. Suspected agents include alcohol and cocaine (Marijuana). Pertinent negatives include no fever, no injury, no nausea, no vomiting, no bladder incontinence and no bowel incontinence. Associated medical issues include mental illness and psychiatric history. Associated medical issues do not include withdrawal syndrome.    Past Medical History  Diagnosis Date  . HIV (human immunodeficiency virus infection)   . Chronic systolic heart failure   . Hypertension   . Genital herpes   . Active smoker   . AR (allergic rhinitis)   . HLD (hyperlipidemia)   . NICM (nonischemic cardiomyopathy)     Echocardiogram 06/28/11: EF 30-35%, mild MR, mild LAE;  No CAD by coronary CT angiogram 3/12 at [facility]  . NSVT (nonsustained ventricular tachycardia)   . Depression   . Bipolar 1  disorder   . Alcohol abuse   . Crack cocaine use     History reviewed. No pertinent past surgical history.  No family history on file.  History  Substance Use Topics  . Smoking status: Current Everyday Smoker -- 0.8 packs/day for 30 years    Types: Cigarettes  . Smokeless tobacco: Never Used  . Alcohol Use: Yes      Review of Systems  Constitutional: Negative for fever, chills and appetite change.  HENT: Negative for ear pain, congestion, sore throat, rhinorrhea, drooling, trouble swallowing, neck pain, neck stiffness, voice change and postnasal drip.   Eyes: Negative for photophobia and visual disturbance.  Respiratory: Negative for cough, chest tightness, shortness of breath and wheezing.   Cardiovascular: Negative for chest pain and palpitations.  Gastrointestinal: Negative for nausea, vomiting, abdominal pain, diarrhea, constipation, abdominal distention and bowel incontinence.  Genitourinary: Negative.  Negative for bladder incontinence.  Musculoskeletal: Negative for myalgias, back pain, joint swelling, arthralgias and gait problem.  Skin: Negative for color change, pallor, rash and wound.  Neurological: Negative for tremors, seizures, loss of consciousness, syncope, facial asymmetry, speech difficulty, weakness, light-headedness, numbness and headaches.  Psychiatric/Behavioral: Negative for suicidal ideas, hallucinations, behavioral problems, confusion, self-injury, dysphoric mood and agitation. The patient is not nervous/anxious and is not hyperactive.     Allergies  Review of patient's allergies indicates no known allergies.  Home Medications   Current Outpatient Rx  Name Route Sig Dispense Refill  . ASPIRIN 81 MG PO TABS Oral Take 81 mg by mouth daily.      . BUPROPION HCL ER (XL) 150 MG PO TB24 Oral  Take 150 mg by mouth daily.    Marland Kitchen CARVEDILOL 6.25 MG PO TABS Oral Take 6.25 mg by mouth 2 (two) times daily with a meal.    . DIGOXIN 0.125 MG PO TABS Oral Take 125  mcg by mouth daily.      Marland Kitchen EMTRICITABINE-TENOFOVIR 200-300 MG PO TABS Oral Take 1 tablet by mouth daily.    Marland Kitchen FAMOTIDINE 20 MG PO TABS Oral Take 20 mg by mouth 2 (two) times daily.    . FUROSEMIDE 40 MG PO TABS Oral Take 40 mg by mouth daily.    Marland Kitchen LISINOPRIL 5 MG PO TABS Oral Take 5 mg by mouth daily.     Marland Kitchen ONE-DAILY MULTI VITAMINS PO TABS Oral Take 1 tablet by mouth daily.      Marland Kitchen POTASSIUM CHLORIDE CRYS ER 20 MEQ PO TBCR Oral Take 20 mEq by mouth daily.    Marland Kitchen RALTEGRAVIR POTASSIUM 400 MG PO TABS Oral Take 400 mg by mouth 2 (two) times daily.    . TRAZODONE HCL 150 MG PO TABS Oral Take 150 mg by mouth at bedtime.      Marland Kitchen VALACYCLOVIR HCL 500 MG PO TABS Oral Take 500 mg by mouth 2 (two) times daily.      BP 119/81  Pulse 93  Temp(Src) 98.3 F (36.8 C) (Oral)  Resp 20  SpO2 99%  Physical Exam  Nursing note and vitals reviewed. Constitutional: He is oriented to person, place, and time. He appears well-developed and well-nourished. No distress.  HENT:  Head: Normocephalic and atraumatic.  Mouth/Throat: Oropharynx is clear and moist.  Eyes: Conjunctivae and EOM are normal. Pupils are equal, round, and reactive to light.  Neck: Normal range of motion.  Cardiovascular: Normal rate, regular rhythm, normal heart sounds and intact distal pulses.  Exam reveals no gallop and no friction rub.   No murmur heard. Pulmonary/Chest: Effort normal and breath sounds normal. No respiratory distress. He has no wheezes. He has no rales. He exhibits no tenderness.  Abdominal: Soft. Bowel sounds are normal. He exhibits no distension. There is no tenderness. There is no rebound and no guarding.  Musculoskeletal: Normal range of motion. He exhibits no edema and no tenderness.  Neurological: He is alert and oriented to person, place, and time. He has normal reflexes. No cranial nerve deficit. He exhibits normal muscle tone. Coordination normal.  Skin: Skin is warm and dry. No rash noted. He is not diaphoretic. No  erythema. No pallor.    ED Course  Procedures (including critical care time)  Labs Reviewed  URINE RAPID DRUG SCREEN (HOSP PERFORMED) - Abnormal; Notable for the following:    Cocaine POSITIVE (*)    All other components within normal limits  URINALYSIS, ROUTINE W REFLEX MICROSCOPIC - Abnormal; Notable for the following:    Hgb urine dipstick TRACE (*)    Ketones, ur 15 (*)    Protein, ur 30 (*)    All other components within normal limits  CBC - Abnormal; Notable for the following:    Hemoglobin 17.9 (*)    MCHC 36.7 (*)    All other components within normal limits  DIFFERENTIAL - Abnormal; Notable for the following:    Neutrophils Relative 42 (*)    All other components within normal limits  COMPREHENSIVE METABOLIC PANEL - Abnormal; Notable for the following:    AST 46 (*) HEMOLYZED SPECIMEN, RESULTS MAY BE AFFECTED   Total Bilirubin 1.8 (*)    All other components within normal limits  ETHANOL  URINE MICROSCOPIC-ADD ON   No results found.   No diagnosis found.    MDM  At this time the patient is stable and medically cleared for psychiatric evaluation and treatment for substance abuse. He does not appear to have acute depression or acute psychosis. He is here voluntarily.       Felisa Bonier, MD 03/17/12 2206

## 2012-03-18 MED ORDER — ONDANSETRON HCL 8 MG PO TABS
ORAL_TABLET | ORAL | Status: AC
  Administered 2012-03-18: 4 mg via ORAL
  Filled 2012-03-18: qty 1

## 2012-03-18 MED ORDER — ASPIRIN 81 MG PO CHEW
81.0000 mg | CHEWABLE_TABLET | Freq: Every day | ORAL | Status: DC
  Administered 2012-03-18 – 2012-03-19 (×2): 81 mg via ORAL
  Filled 2012-03-18 (×2): qty 1

## 2012-03-18 MED ORDER — ASPIRIN 81 MG PO CHEW
CHEWABLE_TABLET | ORAL | Status: AC
  Administered 2012-03-18: 81 mg via ORAL
  Filled 2012-03-18: qty 1

## 2012-03-18 NOTE — ED Provider Notes (Signed)
[facility] wants a d-dimer ordered prior to accepting him to [facility].  Pt with normal oxygen sats, no tachycardia.  Nat Christen, MD 03/18/12 1250

## 2012-03-18 NOTE — ED Notes (Signed)
Pt resting with eyes closed, NAD noted, resp even and unlabored, no needs noted.

## 2012-03-18 NOTE — ED Notes (Signed)
Pt stating that he is feeling "antse and shaky". Spoke with MD regarding pts ativan order, (q8hrs), was made aware that pt could have another dose now.

## 2012-03-18 NOTE — BH Assessment (Addendum)
Assessment Note   Ryan Long is an 51 y.o. male who presents to the emergency department for help with drugs and alcohol; he denies suicidal/homocidal ideations and no psychosis. Patient reports increase in depression when not drinking and has has treatment for this depression in the past. Patient reports 1 prior attempt at substance abuse treatment in 2012 at [facility] but stated he was sent to [facility] after having heart failure. Patient reports that because the staff at [facility] brought his belongings to him while in the hospital he does not want to return to [facility]. Patient was guarded and irritable during the assessment; he would not give specifics regarding the usage of drugs and alcohol; stating it is a lot and he was not feeling well. Counselor attempted to explain why specifics are helpful in determining placement and appropriate treatment for him. Patient continued to only provide non-specific information.   Axis I: Alcohol and Cocaine dependence Axis II: Deferred Axis III:  Past Medical History  Diagnosis Date  . HIV (human immunodeficiency virus infection)   . Chronic systolic heart failure   . Hypertension   . Genital herpes   . Active smoker   . AR (allergic rhinitis)   . HLD (hyperlipidemia)   . NICM (nonischemic cardiomyopathy)     Echocardiogram 06/28/11: EF 30-35%, mild MR, mild LAE;  No CAD by coronary CT angiogram 3/12 at [facility]  . NSVT (nonsustained ventricular tachycardia)   . Depression   . Bipolar 1 disorder   . Alcohol abuse   . Crack cocaine use    Axis IV: economic problems, educational problems, housing problems, occupational problems, other psychosocial or environmental problems, problems related to legal system/crime, problems related to social environment, problems with access to health care services and problems with primary support group Axis V: 41-50 serious symptoms  Past Medical History:  Past Medical History  Diagnosis  Date  . HIV (human immunodeficiency virus infection)   . Chronic systolic heart failure   . Hypertension   . Genital herpes   . Active smoker   . AR (allergic rhinitis)   . HLD (hyperlipidemia)   . NICM (nonischemic cardiomyopathy)     Echocardiogram 06/28/11: EF 30-35%, mild MR, mild LAE;  No CAD by coronary CT angiogram 3/12 at [facility]  . NSVT (nonsustained ventricular tachycardia)   . Depression   . Bipolar 1 disorder   . Alcohol abuse   . Crack cocaine use     History reviewed. No pertinent past surgical history.  Family History: No family history on file.  Social History:  reports that he has been smoking Cigarettes.  He has a 24 pack-year smoking history. He has never used smokeless tobacco. He reports that he drinks alcohol. He reports that he uses illicit drugs (Cocaine and Marijuana).  Additional Social History:  Alcohol / Drug Use History of alcohol / drug use?: Yes Longest period of sobriety (when/how long): Months Negative Consequences of Use: Legal Withdrawal Symptoms: Agitation;Irritability;Nausea / Vomiting;Fever / Chills;Other (Comment) Substance #1 Name of Substance 1: Alcohol (Beer, wine, Liquar) 1 - Age of First Use: Unknown- patient did not want to talk about alchohol abuse 1 - Amount (size/oz): Unknown- "patient responded with alot" 1 - Frequency: daily 1 - Duration: Patient's response was a long time 1 - Last Use / Amount: 2 or 3 PM on 03/17/12 Substance #2 Name of Substance 2: Cocaine 2 - Age of First Use: Unknown 2 - Amount (size/oz): Unknown 2 -  Frequency: Unknown Allergies: No Known Allergies  Home Medications:  Medications Prior to Admission  Medication Dose Route Frequency Provider Last Rate Last Dose  . acetaminophen (TYLENOL) tablet 650 mg  650 mg Oral Q4H PRN Felisa Bonier, MD      . alum & mag hydroxide-simeth (MAALOX/MYLANTA) 200-200-20 MG/5ML suspension 30 mL  30 mL Oral PRN Felisa Bonier, MD      . aspirin 81  MG chewable tablet           . aspirin tablet 81 mg  81 mg Oral Daily Felisa Bonier, MD   81 mg at 03/18/12 0024  . buPROPion (WELLBUTRIN XL) 24 hr tablet 150 mg  150 mg Oral Daily Felisa Bonier, MD      . buPROPion (WELLBUTRIN XL) 24 hr tablet 150 mg  150 mg Oral To Minor Felisa Bonier, MD   150 mg at 03/18/12 0018  . carvedilol (COREG) tablet 6.25 mg  6.25 mg Oral BID WC Felisa Bonier, MD      . carvedilol (COREG) tablet 6.25 mg  6.25 mg Oral To Minor Felisa Bonier, MD   6.25 mg at 03/18/12 0019  . digoxin (LANOXIN) tablet 125 mcg  125 mcg Oral Daily Felisa Bonier, MD   125 mcg at 03/17/12 2233  . emtricitabine-tenofovir (TRUVADA) 200-300 MG per tablet 1 tablet  1 tablet Oral Daily Felisa Bonier, MD      . emtricitabine-tenofovir (TRUVADA) 200-300 MG per tablet 1 tablet  1 tablet Oral To Minor Felisa Bonier, MD   1 tablet at 03/18/12 0014  . famotidine (PEPCID) tablet 20 mg  20 mg Oral BID Felisa Bonier, MD   20 mg at 03/18/12 0018  . furosemide (LASIX) tablet 40 mg  40 mg Oral Daily Felisa Bonier, MD   40 mg at 03/17/12 2234  . ibuprofen (ADVIL,MOTRIN) tablet 600 mg  600 mg Oral Q8H PRN Felisa Bonier, MD      . lisinopril (PRINIVIL,ZESTRIL) tablet 5 mg  5 mg Oral Daily Felisa Bonier, MD      . lisinopril (PRINIVIL,ZESTRIL) tablet 5 mg  5 mg Oral To Minor Felisa Bonier, MD   5 mg at 03/18/12 0015  . LORazepam (ATIVAN) tablet 1 mg  1 mg Oral Q8H PRN Felisa Bonier, MD      . nicotine (NICODERM CQ - dosed in mg/24 hours) patch 21 mg  21 mg Transdermal Daily Felisa Bonier, MD   21 mg at 03/17/12 2235  . ondansetron (ZOFRAN) tablet 4 mg  4 mg Oral Q8H PRN Felisa Bonier, MD      . potassium chloride SA (K-DUR,KLOR-CON) CR tablet 20 mEq  20 mEq Oral Daily Felisa Bonier, MD   20 mEq at 03/17/12 2231  . raltegravir (ISENTRESS) tablet 400 mg  400 mg Oral BID Felisa Bonier, MD      . raltegravir (ISENTRESS) tablet 400 mg  400 mg Oral To Minor Felisa Bonier, MD   400 mg at 03/18/12 0014  . traZODone (DESYREL) tablet 150 mg  150 mg Oral QHS Felisa Bonier, MD      . traZODone (DESYREL) tablet 150 mg  150 mg Oral To Minor Felisa Bonier, MD   150 mg at 03/18/12 0013  . valACYclovir (VALTREX) tablet 500 mg  500 mg Oral BID Felisa Bonier, MD      . valACYclovir (VALTREX) tablet 500  mg  500 mg Oral To Minor Felisa Bonier, MD   500 mg at 03/18/12 0013  . zolpidem (AMBIEN) tablet 5 mg  5 mg Oral QHS PRN Felisa Bonier, MD       Medications Prior to Admission  Medication Sig Dispense Refill  . aspirin 81 MG tablet Take 81 mg by mouth daily.        . carvedilol (COREG) 6.25 MG tablet Take 6.25 mg by mouth 2 (two) times daily with a meal.      . digoxin (LANOXIN) 0.125 MG tablet Take 125 mcg by mouth daily.        Marland Kitchen emtricitabine-tenofovir (TRUVADA) 200-300 MG per tablet Take 1 tablet by mouth daily.      . furosemide (LASIX) 40 MG tablet Take 40 mg by mouth daily.      Marland Kitchen lisinopril (PRINIVIL,ZESTRIL) 5 MG tablet Take 5 mg by mouth daily.       . Multiple Vitamin (MULTIVITAMIN) tablet Take 1 tablet by mouth daily.        . potassium chloride SA (K-DUR,KLOR-CON) 20 MEQ tablet Take 20 mEq by mouth daily.      . raltegravir (ISENTRESS) 400 MG tablet Take 400 mg by mouth 2 (two) times daily.      . traZODone (DESYREL) 150 MG tablet Take 150 mg by mouth at bedtime.        . valACYclovir (VALTREX) 500 MG tablet Take 500 mg by mouth 2 (two) times daily.        OB/GYN Status:  No LMP for male patient.  General Assessment Data Location of Assessment: [facility] ED ACT Assessment: Yes Living Arrangements: Homeless Can pt return to current living arrangement?: Yes (Patient needs assistance with placement after tx) Admission Status: Voluntary Is patient capable of signing voluntary admission?: Yes Transfer from: Other (Comment) (Pt is homeless) Referral Source: Self/Family/Friend     Risk to self Suicidal Ideation: No Suicidal Intent: No Is  patient at risk for suicide?: No Suicidal Plan?: No Access to Means: No What has been your use of drugs/alcohol within the last 12 months?:  (Pt reports "alot" and is unable to quantify) Previous Attempts/Gestures: No Intentional Self Injurious Behavior: None Family Suicide History: No Recent stressful life event(s): Legal Issues (Court date on 03/21/12) Persecutory voices/beliefs?: No Depression: Yes Depression Symptoms: Despondent;Insomnia;Feeling worthless/self pity;Feeling angry/irritable (Worse when not drinking) Substance abuse history and/or treatment for substance abuse?: Yes Suicide prevention information given to non-admitted patients: Not applicable  Risk to Others Homicidal Ideation: No Thoughts of Harm to Others: No Current Homicidal Intent: No Current Homicidal Plan: No Access to Homicidal Means: No History of harm to others?: No Assessment of Violence: None Noted Does patient have access to weapons?: No Criminal Charges Pending?: Yes Describe Pending Criminal Charges:  (related to drugs and alcohol) Does patient have a court date: Yes Court Date: 03/21/12  Psychosis Hallucinations: None noted Delusions: None noted  Mental Status Report Appear/Hygiene: Other (Comment) (Appropriate) Eye Contact: Poor Motor Activity: Unremarkable Speech: Logical/coherent Level of Consciousness: Alert;Quiet/awake;Irritable Mood: Irritable Affect: Irritable Anxiety Level: None Thought Processes: Coherent Judgement: Unimpaired Orientation: Person;Place;Time;Situation Obsessive Compulsive Thoughts/Behaviors: None  Cognitive Functioning Concentration: Normal Memory: Recent Intact IQ: Average Insight: Fair Impulse Control: Fair Appetite: Good Sleep: Decreased Total Hours of Sleep:  (Unknown- reports decrease w/out alcohol) Vegetative Symptoms: None  Prior Inpatient Therapy Prior Inpatient Therapy: Yes Prior Therapy Dates:  (2012) Prior Therapy Facilty/Provider(s):   ([facility]) Reason for Treatment:  (Substance abuse treatment)  Prior  Outpatient Therapy Prior Outpatient Therapy: Yes Prior Therapy Dates: 2012 Prior Therapy Facilty/Provider(s): [facility] Reason for Treatment: Substance Abuse          Abuse/Neglect Assessment (Assessment to be complete while patient is alone) Physical Abuse: Denies Verbal Abuse: Denies Sexual Abuse: Denies Exploitation of patient/patient's resources: Denies Self-Neglect: Denies Values / Beliefs Cultural Requests During Hospitalization: None Spiritual Requests During Hospitalization: None Consults Spiritual Care Consult Needed: No Social Work Consult Needed: No      Additional Information 1:1 In Past 12 Months?: No CIRT Risk: No Elopement Risk: No Does patient have medical clearance?: Yes     Disposition:  Disposition Disposition of Patient: Referred to Patient referred to: Other (Comment);[facility] (Pt wants referral to [facility])   03/18/12 0221Counselor spoke with Baxter Hire at [facility]; Patient will need to resolve upcoming court date on 03/21/12 prior to coming for detox or be able to provide in writing that someone else can go for him because they cannot provide transportation to court date while in detox. Counselor will review disposition with patient for possible alternatives and inform MD.  03/18/12 0230 Reviewed disposition with patient and he stated that he will have to think about who can could assist with that. [id number] Counselor called [facility] back for update and [facility] Arlys [name]) stated that due to some of patient's medical issues that his referral will need to be reviewed again on 1st shift for a final determination with current set of vitals and BAC faxed over at that time.  On Site Evaluation by:   Reviewed with Physician:     [place] Charon Shauntel MS, LPCA 03/18/2012 1:23 AM

## 2012-03-18 NOTE — ED Notes (Signed)
Patient in paper scrubs. [place] sand which and soda given to patient.

## 2012-03-18 NOTE — BH Assessment (Signed)
Assessment Note   Update:  Called [facility] in reference to pt referral this AM.  Per Arlys [name], labs were needed and additional ones gathered to review.  Called again this afternoon, left message and no returned call.  Called again, spoke with Arlys [name] regarding referral and he stated pt was declined due to medical acuity @ 1730.  Called [facility], and per [place], beds available.  Completed prescreen and faxed referral to [facility] for review.  Updated assessment disposition, completed assessment notification and faxed to [facility] to log.  Updated ED staff.  Oncoming staff will need to complete authorization for [facility] if pt accepted. Disposition:  Disposition Disposition of Patient: Referred to Patient referred to: [facility] (Pending [facility])  On Site Evaluation by:   Reviewed with Physician:  [place]   Caryl Comes 03/18/2012 6:33 PM

## 2012-03-19 ENCOUNTER — Inpatient Hospital Stay (HOSPITAL_COMMUNITY)
Admission: AD | Admit: 2012-03-19 | Discharge: 2012-03-23 | DRG: 897 | Disposition: A | Discharge status: Home / Self Care | Payer: Federal, State, Local not specified - Other | Source: Ambulatory Visit | Attending: Psychiatry | Admitting: Psychiatry

## 2012-03-19 ENCOUNTER — Encounter (HOSPITAL_COMMUNITY): Payer: Self-pay | Admitting: *Deleted

## 2012-03-19 DIAGNOSIS — I5022 Chronic systolic (congestive) heart failure: Secondary | ICD-10-CM

## 2012-03-19 DIAGNOSIS — B20 Human immunodeficiency virus [HIV] disease: Secondary | ICD-10-CM

## 2012-03-19 DIAGNOSIS — F603 Borderline personality disorder: Secondary | ICD-10-CM

## 2012-03-19 DIAGNOSIS — F1410 Cocaine abuse, uncomplicated: Secondary | ICD-10-CM | POA: Diagnosis present

## 2012-03-19 DIAGNOSIS — Z560 Unemployment, unspecified: Secondary | ICD-10-CM

## 2012-03-19 DIAGNOSIS — Z79899 Other long term (current) drug therapy: Secondary | ICD-10-CM

## 2012-03-19 DIAGNOSIS — J309 Allergic rhinitis, unspecified: Secondary | ICD-10-CM

## 2012-03-19 DIAGNOSIS — F319 Bipolar disorder, unspecified: Secondary | ICD-10-CM

## 2012-03-19 DIAGNOSIS — E785 Hyperlipidemia, unspecified: Secondary | ICD-10-CM

## 2012-03-19 DIAGNOSIS — I428 Other cardiomyopathies: Secondary | ICD-10-CM

## 2012-03-19 DIAGNOSIS — F1020 Alcohol dependence, uncomplicated: Principal | ICD-10-CM | POA: Diagnosis present

## 2012-03-19 DIAGNOSIS — Z5900 Homelessness unspecified: Secondary | ICD-10-CM

## 2012-03-19 DIAGNOSIS — I509 Heart failure, unspecified: Secondary | ICD-10-CM

## 2012-03-19 DIAGNOSIS — Z21 Asymptomatic human immunodeficiency virus [HIV] infection status: Secondary | ICD-10-CM

## 2012-03-19 DIAGNOSIS — I10 Essential (primary) hypertension: Secondary | ICD-10-CM

## 2012-03-19 DIAGNOSIS — F17200 Nicotine dependence, unspecified, uncomplicated: Secondary | ICD-10-CM

## 2012-03-19 MED ORDER — POLYETHYLENE GLYCOL 3350 17 G PO PACK
17.0000 g | PACK | Freq: Every day | ORAL | Status: DC
  Administered 2012-03-20 – 2012-03-22 (×3): 17 g via ORAL
  Filled 2012-03-19 (×7): qty 1

## 2012-03-19 MED ORDER — RALTEGRAVIR POTASSIUM 400 MG PO TABS
400.0000 mg | ORAL_TABLET | Freq: Two times a day (BID) | ORAL | Status: DC
  Administered 2012-03-19 – 2012-03-23 (×8): 400 mg via ORAL

## 2012-03-19 MED ORDER — POTASSIUM CHLORIDE CRYS ER 20 MEQ PO TBCR
20.0000 meq | EXTENDED_RELEASE_TABLET | Freq: Every day | ORAL | Status: DC
  Administered 2012-03-20 – 2012-03-23 (×4): 20 meq via ORAL
  Filled 2012-03-19 (×7): qty 1

## 2012-03-19 MED ORDER — LISINOPRIL 5 MG PO TABS
5.0000 mg | ORAL_TABLET | Freq: Every day | ORAL | Status: DC
  Administered 2012-03-20 – 2012-03-23 (×4): 5 mg via ORAL
  Filled 2012-03-19 (×6): qty 1

## 2012-03-19 MED ORDER — CHLORDIAZEPOXIDE HCL 25 MG PO CAPS
25.0000 mg | ORAL_CAPSULE | Freq: Four times a day (QID) | ORAL | Status: DC
  Administered 2012-03-20: 25 mg via ORAL
  Filled 2012-03-19: qty 1

## 2012-03-19 MED ORDER — DIGOXIN 125 MCG PO TABS
125.0000 ug | ORAL_TABLET | Freq: Every day | ORAL | Status: DC
  Administered 2012-03-20 – 2012-03-23 (×4): 125 ug via ORAL
  Filled 2012-03-19 (×6): qty 1

## 2012-03-19 MED ORDER — ONDANSETRON 4 MG PO TBDP
4.0000 mg | ORAL_TABLET | Freq: Four times a day (QID) | ORAL | Status: AC | PRN
Start: 2012-03-19 — End: 2012-03-22

## 2012-03-19 MED ORDER — CHLORDIAZEPOXIDE HCL 25 MG PO CAPS: 25.0000 mg | ORAL_CAPSULE | ORAL | Status: DC

## 2012-03-19 MED ORDER — LORATADINE 10 MG PO TABS
10.0000 mg | ORAL_TABLET | Freq: Every day | ORAL | Status: DC
  Administered 2012-03-21 – 2012-03-22 (×2): 10 mg via ORAL
  Filled 2012-03-19 (×6): qty 1

## 2012-03-19 MED ORDER — CHLORDIAZEPOXIDE HCL 25 MG PO CAPS: 25.0000 mg | ORAL_CAPSULE | Freq: Three times a day (TID) | ORAL | Status: DC

## 2012-03-19 MED ORDER — ASPIRIN 81 MG PO CHEW
81.0000 mg | CHEWABLE_TABLET | Freq: Every day | ORAL | Status: DC
  Administered 2012-03-20 – 2012-03-23 (×4): 81 mg via ORAL
  Filled 2012-03-19 (×6): qty 1

## 2012-03-19 MED ORDER — PNEUMOCOCCAL VAC POLYVALENT 25 MCG/0.5ML IJ INJ
0.5000 mL | INJECTION | INTRAMUSCULAR | Status: AC
Start: 2012-03-20 — End: 2012-03-21

## 2012-03-19 MED ORDER — GUAIFENESIN ER 600 MG PO TB12
1200.0000 mg | ORAL_TABLET | Freq: Two times a day (BID) | ORAL | Status: DC
  Administered 2012-03-20 – 2012-03-23 (×7): 1200 mg via ORAL
  Filled 2012-03-19 (×13): qty 2

## 2012-03-19 MED ORDER — EMTRICITABINE-TENOFOVIR DF 200-300 MG PO TABS
1.0000 | ORAL_TABLET | Freq: Every day | ORAL | Status: DC
  Administered 2012-03-20 – 2012-03-23 (×4): 1 via ORAL

## 2012-03-19 MED ORDER — DOCUSATE SODIUM 100 MG PO CAPS
100.0000 mg | ORAL_CAPSULE | Freq: Two times a day (BID) | ORAL | Status: DC
  Administered 2012-03-20 – 2012-03-23 (×7): 100 mg via ORAL
  Filled 2012-03-19 (×13): qty 1

## 2012-03-19 MED ORDER — MAGNESIUM HYDROXIDE 400 MG/5ML PO SUSP: 30.0000 mL | Freq: Every day | ORAL | Status: DC | PRN

## 2012-03-19 MED ORDER — LOPERAMIDE HCL 2 MG PO CAPS: 2.0000 mg | ORAL_CAPSULE | ORAL | Status: AC | PRN

## 2012-03-19 MED ORDER — CLOTRIMAZOLE 1 % EX CREA
TOPICAL_CREAM | Freq: Every day | CUTANEOUS | Status: DC
  Administered 2012-03-21 – 2012-03-23 (×3): via TOPICAL
  Filled 2012-03-19: qty 15

## 2012-03-19 MED ORDER — ALBUTEROL SULFATE HFA 108 (90 BASE) MCG/ACT IN AERS
2.0000 | INHALATION_SPRAY | RESPIRATORY_TRACT | Status: DC
  Administered 2012-03-20 – 2012-03-22 (×10): 2 via RESPIRATORY_TRACT
  Filled 2012-03-19 (×2): qty 6.7

## 2012-03-19 MED ORDER — TRAZODONE HCL 150 MG PO TABS
150.0000 mg | ORAL_TABLET | Freq: Every day | ORAL | Status: DC
  Administered 2012-03-19 – 2012-03-22 (×4): 150 mg via ORAL
  Filled 2012-03-19 (×4): qty 1
  Filled 2012-03-19: qty 3
  Filled 2012-03-19 (×2): qty 1

## 2012-03-19 MED ORDER — FAMOTIDINE 20 MG PO TABS
20.0000 mg | ORAL_TABLET | Freq: Every day | ORAL | Status: DC
  Filled 2012-03-19: qty 1

## 2012-03-19 MED ORDER — VITAMIN B-1 100 MG PO TABS
100.0000 mg | ORAL_TABLET | Freq: Every day | ORAL | Status: DC
  Administered 2012-03-20 – 2012-03-23 (×4): 100 mg via ORAL
  Filled 2012-03-19 (×6): qty 1

## 2012-03-19 MED ORDER — CHLORDIAZEPOXIDE HCL 25 MG PO CAPS
50.0000 mg | ORAL_CAPSULE | Freq: Once | ORAL | Status: AC
  Administered 2012-03-19: 50 mg via ORAL
  Filled 2012-03-19: qty 2

## 2012-03-19 MED ORDER — BUPROPION HCL ER (XL) 150 MG PO TB24
150.0000 mg | ORAL_TABLET | Freq: Every day | ORAL | Status: DC
  Administered 2012-03-20 – 2012-03-23 (×4): 150 mg via ORAL
  Filled 2012-03-19 (×6): qty 1

## 2012-03-19 MED ORDER — ADULT MULTIVITAMIN W/MINERALS CH
1.0000 | ORAL_TABLET | Freq: Every day | ORAL | Status: DC
  Administered 2012-03-20 – 2012-03-23 (×4): 1 via ORAL
  Filled 2012-03-19 (×6): qty 1

## 2012-03-19 MED ORDER — CHLORDIAZEPOXIDE HCL 25 MG PO CAPS
25.0000 mg | ORAL_CAPSULE | Freq: Four times a day (QID) | ORAL | Status: AC | PRN
  Filled 2012-03-19: qty 1

## 2012-03-19 MED ORDER — CARVEDILOL 6.25 MG PO TABS
6.2500 mg | ORAL_TABLET | Freq: Two times a day (BID) | ORAL | Status: DC
  Administered 2012-03-20 – 2012-03-23 (×7): 6.25 mg via ORAL
  Filled 2012-03-19 (×11): qty 1

## 2012-03-19 MED ORDER — ADULT MULTIVITAMIN W/MINERALS CH
1.0000 | ORAL_TABLET | Freq: Every day | ORAL | Status: DC
  Administered 2012-03-19: 1 via ORAL
  Filled 2012-03-19 (×4): qty 1

## 2012-03-19 MED ORDER — ACETAMINOPHEN 325 MG PO TABS
650.0000 mg | ORAL_TABLET | Freq: Four times a day (QID) | ORAL | Status: DC | PRN
  Administered 2012-03-20 – 2012-03-23 (×5): 650 mg via ORAL

## 2012-03-19 MED ORDER — ALUM & MAG HYDROXIDE-SIMETH 200-200-20 MG/5ML PO SUSP
30.0000 mL | ORAL | Status: DC | PRN
  Administered 2012-03-22: 30 mL via ORAL

## 2012-03-19 MED ORDER — VALACYCLOVIR HCL 500 MG PO TABS
500.0000 mg | ORAL_TABLET | Freq: Two times a day (BID) | ORAL | Status: DC
  Administered 2012-03-19 – 2012-03-23 (×8): 500 mg via ORAL

## 2012-03-19 MED ORDER — CHLORDIAZEPOXIDE HCL 25 MG PO CAPS: 25.0000 mg | ORAL_CAPSULE | Freq: Every day | ORAL | Status: DC

## 2012-03-19 MED ORDER — HYDROXYZINE HCL 25 MG PO TABS: 25.0000 mg | ORAL_TABLET | Freq: Four times a day (QID) | ORAL | Status: AC | PRN

## 2012-03-19 MED ORDER — THIAMINE HCL 100 MG/ML IJ SOLN: 100.0000 mg | Freq: Once | INTRAMUSCULAR | Status: DC

## 2012-03-19 MED ORDER — NALTREXONE HCL 50 MG PO TABS
50.0000 mg | ORAL_TABLET | Freq: Every day | ORAL | Status: DC
  Administered 2012-03-21 – 2012-03-23 (×3): 50 mg via ORAL
  Filled 2012-03-19 (×7): qty 1

## 2012-03-19 MED ORDER — FAMOTIDINE 20 MG PO TABS
20.0000 mg | ORAL_TABLET | Freq: Two times a day (BID) | ORAL | Status: DC
  Administered 2012-03-19 – 2012-03-23 (×8): 20 mg via ORAL
  Filled 2012-03-19 (×12): qty 1
  Filled 2012-03-19: qty 2
  Filled 2012-03-19 (×2): qty 1

## 2012-03-19 MED ORDER — FUROSEMIDE 40 MG PO TABS
40.0000 mg | ORAL_TABLET | Freq: Every day | ORAL | Status: DC
  Administered 2012-03-20 – 2012-03-23 (×4): 40 mg via ORAL
  Filled 2012-03-19 (×6): qty 1

## 2012-03-19 NOTE — ED Provider Notes (Signed)
Pleasant and ccoperative still requesting help with drug and alcohol problem . No complaint at present. Appears comfortable.Note Pt had mildly elevated ddimer form yesterday. Pt denies sob , chest pain palpitaioon s of extremity oedema or pain . on exam nad lungs cta extermites withjout peripheal edema or tenderness. Mildly elevated ddimer  Does not correlate cllincally to acute Charolette Child, MD 03/19/12 1036

## 2012-03-19 NOTE — BH Assessment (Addendum)
Assessment Note   Ryan Long is an 51 y.o. male that was reassessed this day.  Pt denies SI/HI/psychosis.  Pt does continue to endorse depressive symptoms and still wants detox.  Pt uses cocaine and ETOH.  Patient continues to appear guarded and irritable during the assessment and would not give specifics regarding the usage of drugs and alcohol, just stated it was "a lot."  As pt declined at [facility] and [facility] due to acuity, pt was ran at [facility] and accepted by Dr. Allena Katz to Dr. Catha Brow.  Pt is pending an available bed.  No new presenting or historical information was gathered at this time.  Completed reassessment, assessment notification and faxed to [facility] to log.  Updated ED staff.  [facility] to call when pt bed ready.    Previous Notes:  Update: Called [facility] in reference to pt referral this AM. Per Arlys [name], labs were needed and additional ones gathered to review. Called again this afternoon, left message and no returned call. Called again, spoke with Arlys [name] regarding referral and he stated pt was declined due to medical acuity @ 1730. Called [facility], and per [facility], beds available. Completed prescreen and faxed referral to [facility] for review. Updated assessment disposition, completed assessment notification and faxed to [facility] to log. Updated ED staff. Oncoming staff will need to complete authorization for [facility] if pt accepted.   Ryan Long is an 51 y.o. male who presents to the emergency department for help with drugs and alcohol; he denies suicidal/homocidal ideations and no psychosis. Patient reports increase in depression when not drinking and has has treatment for this depression in the past. Patient reports 1 prior attempt at substance abuse treatment in 2012 at [facility] but stated he was sent to [facility] after having heart failure. Patient reports that because the staff at [facility] brought his belongings to him while in the hospital he does not want to return to [facility]. Patient was guarded and irritable during the  assessment; he would not give specifics regarding the usage of drugs and alcohol; stating it is a lot and he was not feeling well. Counselor attempted to explain why specifics are helpful in determining placement and appropriate treatment for him. Patient continued to only provide non-specific information.    Axis I: Alcohol Dependence, Cocaine Dependence Axis II: Deferred Axis III:  Past Medical History  Diagnosis Date  . HIV (human immunodeficiency virus infection)   . Chronic systolic heart failure   . Hypertension   . Genital herpes   . Active smoker   . AR (allergic rhinitis)   . HLD (hyperlipidemia)   . NICM (nonischemic cardiomyopathy)     Echocardiogram 06/28/11: EF 30-35%, mild MR, mild LAE;  No CAD by coronary CT angiogram 3/12 at [facility]  . NSVT (nonsustained ventricular tachycardia)   . Depression   . Bipolar 1 disorder   . Alcohol abuse   . Crack cocaine use    Axis IV: economic problems, housing problems, occupational problems, other psychosocial or environmental problems, problems related to legal system/crime, problems related to social environment and problems with access to health care services Axis V: 41-50 serious symptoms  Past Medical History:  Past Medical History  Diagnosis Date  . HIV (human immunodeficiency virus infection)   . Chronic systolic heart failure   . Hypertension   . Genital herpes   . Active smoker   . AR (allergic rhinitis)   . HLD (hyperlipidemia)   . NICM (nonischemic cardiomyopathy)  Echocardiogram 06/28/11: EF 30-35%, mild MR, mild LAE;  No CAD by coronary CT angiogram 3/12 at [facility]  . NSVT (nonsustained ventricular tachycardia)   . Depression   . Bipolar 1 disorder   . Alcohol abuse   . Crack cocaine use     History reviewed. No pertinent past surgical history.  Family History: No family history on file.  Social History:  reports that he has been smoking Cigarettes.  He has a 24  pack-year smoking history. He has never used smokeless tobacco. He reports that he drinks alcohol. He reports that he uses illicit drugs (Cocaine and Marijuana).  Additional Social History:  Alcohol / Drug Use Pain Medications: see list Prescriptions: see list Over the Counter: see list History of alcohol / drug use?: Yes Longest period of sobriety (when/how long): Months Negative Consequences of Use: Legal Withdrawal Symptoms: Agitation;Irritability;Nausea / Vomiting;Fever / Chills;Other (Comment) Substance #1 Name of Substance 1: Alcohol (Beer, wine, Liquar) 1 - Age of First Use: Unknown- patient did not want to talk about alchohol abuse 1 - Amount (size/oz): Unknown- "patient responded with alot" 1 - Frequency: daily 1 - Duration: Patient's response was a long time 1 - Last Use / Amount: 2 or 3 PM on 03/17/12 Substance #2 Name of Substance 2: Cocaine 2 - Age of First Use: Unknown 2 - Amount (size/oz): Unknown 2 - Frequency: Unknown Allergies: No Known Allergies  Home Medications:  Medications Prior to Admission  Medication Dose Route Frequency Provider Last Rate Last Dose  . acetaminophen (TYLENOL) tablet 650 mg  650 mg Oral Q4H PRN Felisa Bonier, MD   650 mg at 03/19/12 1610  . alum & mag hydroxide-simeth (MAALOX/MYLANTA) 200-200-20 MG/5ML suspension 30 mL  30 mL Oral PRN Felisa Bonier, MD   30 mL at 03/19/12 0608  . aspirin 81 MG chewable tablet        81 mg at 03/18/12 0904  . aspirin chewable tablet 81 mg  81 mg Oral Daily Raeford Razor, MD   81 mg at 03/19/12 1019  . buPROPion (WELLBUTRIN XL) 24 hr tablet 150 mg  150 mg Oral Daily Felisa Bonier, MD   150 mg at 03/19/12 1120  . buPROPion (WELLBUTRIN XL) 24 hr tablet 150 mg  150 mg Oral To Minor Felisa Bonier, MD   150 mg at 03/18/12 0018  . carvedilol (COREG) tablet 6.25 mg  6.25 mg Oral BID WC Felisa Bonier, MD   6.25 mg at 03/19/12 0744  . carvedilol (COREG) tablet 6.25 mg  6.25 mg Oral To Minor Felisa Bonier, MD   6.25 mg at 03/18/12 0019  . digoxin (LANOXIN) tablet 125 mcg  125 mcg Oral Daily Felisa Bonier, MD   125 mcg at 03/18/12 9604  . emtricitabine-tenofovir (TRUVADA) 200-300 MG per tablet 1 tablet  1 tablet Oral Daily Felisa Bonier, MD   1 tablet at 03/19/12 1120  . emtricitabine-tenofovir (TRUVADA) 200-300 MG per tablet 1 tablet  1 tablet Oral To Minor Felisa Bonier, MD   1 tablet at 03/18/12 0014  . famotidine (PEPCID) tablet 20 mg  20 mg Oral BID Felisa Bonier, MD   20 mg at 03/19/12 1018  . furosemide (LASIX) tablet 40 mg  40 mg Oral Daily Felisa Bonier, MD   40 mg at 03/19/12 1018  . ibuprofen (ADVIL,MOTRIN) tablet 600 mg  600 mg Oral Q8H PRN Felisa Bonier, MD      .  lisinopril (PRINIVIL,ZESTRIL) tablet 5 mg  5 mg Oral Daily Felisa Bonier, MD   5 mg at 03/18/12 0905  . lisinopril (PRINIVIL,ZESTRIL) tablet 5 mg  5 mg Oral To Minor Felisa Bonier, MD   5 mg at 03/18/12 0015  . LORazepam (ATIVAN) tablet 1 mg  1 mg Oral Q8H PRN Felisa Bonier, MD   1 mg at 03/18/12 1221  . nicotine (NICODERM CQ - dosed in mg/24 hours) patch 21 mg  21 mg Transdermal Daily Felisa Bonier, MD   21 mg at 03/19/12 1018  . ondansetron (ZOFRAN) tablet 4 mg  4 mg Oral Q8H PRN Felisa Bonier, MD   4 mg at 03/18/12 0902  . potassium chloride SA (K-DUR,KLOR-CON) CR tablet 20 mEq  20 mEq Oral Daily Felisa Bonier, MD   20 mEq at 03/19/12 1018  . raltegravir (ISENTRESS) tablet 400 mg  400 mg Oral BID Felisa Bonier, MD   400 mg at 03/19/12 1019  . raltegravir (ISENTRESS) tablet 400 mg  400 mg Oral To Minor Felisa Bonier, MD   400 mg at 03/18/12 0014  . traZODone (DESYREL) tablet 150 mg  150 mg Oral QHS Felisa Bonier, MD   150 mg at 03/18/12 2155  . traZODone (DESYREL) tablet 150 mg  150 mg Oral To Minor Felisa Bonier, MD   150 mg at 03/18/12 0013  . valACYclovir (VALTREX) tablet 500 mg  500 mg Oral BID Felisa Bonier, MD   500 mg at 03/19/12 1019  . valACYclovir (VALTREX)  tablet 500 mg  500 mg Oral To Minor Felisa Bonier, MD   500 mg at 03/18/12 0013  . zolpidem (AMBIEN) tablet 5 mg  5 mg Oral QHS PRN Felisa Bonier, MD      . DISCONTD: aspirin tablet 81 mg  81 mg Oral Daily Felisa Bonier, MD   81 mg at 03/18/12 0024   Medications Prior to Admission  Medication Sig Dispense Refill  . aspirin 81 MG tablet Take 81 mg by mouth daily.        . carvedilol (COREG) 6.25 MG tablet Take 6.25 mg by mouth 2 (two) times daily with a meal.      . digoxin (LANOXIN) 0.125 MG tablet Take 125 mcg by mouth daily.        Marland Kitchen emtricitabine-tenofovir (TRUVADA) 200-300 MG per tablet Take 1 tablet by mouth daily.      . furosemide (LASIX) 40 MG tablet Take 40 mg by mouth daily.      Marland Kitchen lisinopril (PRINIVIL,ZESTRIL) 5 MG tablet Take 5 mg by mouth daily.       . Multiple Vitamin (MULTIVITAMIN) tablet Take 1 tablet by mouth daily.        . potassium chloride SA (K-DUR,KLOR-CON) 20 MEQ tablet Take 20 mEq by mouth daily.      . raltegravir (ISENTRESS) 400 MG tablet Take 400 mg by mouth 2 (two) times daily.      . traZODone (DESYREL) 150 MG tablet Take 150 mg by mouth at bedtime.        . valACYclovir (VALTREX) 500 MG tablet Take 500 mg by mouth 2 (two) times daily.        OB/GYN Status:  No LMP for male patient.  General Assessment Data Location of Assessment: [facility] ED ACT Assessment: Yes Living Arrangements: Homeless Can pt return to current living arrangement?: Yes Admission Status: Voluntary Is patient capable of signing voluntary admission?: Yes  Transfer from: Acute Hospital Referral Source: Self/Family/Friend  Education Status Is patient currently in school?: No  Risk to self Suicidal Ideation: No Suicidal Intent: No Is patient at risk for suicide?: No Suicidal Plan?: No Access to Means: No What has been your use of drugs/alcohol within the last 12 months?: pt reports "a lot," and unable to give amount Previous Attempts/Gestures: No How many times?: 0  Other  Self Harm Risks: denies Triggers for Past Attempts: Other (Comment) (pt denies) Intentional Self Injurious Behavior: None Family Suicide History: No Recent stressful life event(s): Legal Issues (court date on 03/21/12) Persecutory voices/beliefs?: No Depression: Yes Depression Symptoms: Despondent;Insomnia;Feeling worthless/self pity;Feeling angry/irritable Substance abuse history and/or treatment for substance abuse?: Yes Suicide prevention information given to non-admitted patients: Not applicable  Risk to Others Homicidal Ideation: No Thoughts of Harm to Others: No Current Homicidal Intent: No Current Homicidal Plan: No Access to Homicidal Means: No Identified Victim: na History of harm to others?: No Assessment of Violence: None Noted Violent Behavior Description: na - pt calm, cooperative Does patient have access to weapons?: No Criminal Charges Pending?: Yes Describe Pending Criminal Charges:  (related to drugs and alcohol) Does patient have a court date: Yes Court Date: 03/21/12  Psychosis Hallucinations: None noted Delusions: None noted  Mental Status Report Appear/Hygiene: Other (Comment) (Appropriate) Eye Contact: Poor Motor Activity: Unremarkable Speech: Logical/coherent Level of Consciousness: Alert Mood: Depressed Affect: Irritable Anxiety Level: None Thought Processes: Coherent;Relevant Judgement: Unimpaired Orientation: Person;Place;Time;Situation Obsessive Compulsive Thoughts/Behaviors: None  Cognitive Functioning Concentration: Normal Memory: Recent Intact;Remote Intact IQ: Average Insight: Fair Impulse Control: Fair Appetite: Good Weight Loss: 0  Weight Gain: 0  Sleep: Decreased Total Hours of Sleep:  (pt did not state) Vegetative Symptoms: None  Prior Inpatient Therapy Prior Inpatient Therapy: Yes Prior Therapy Dates: 2012 Prior Therapy Facilty/Provider(s): [facility] Reason for Treatment: SA  Prior Outpatient Therapy Prior Outpatient  Therapy: Yes Prior Therapy Dates: 2012 Prior Therapy Facilty/Provider(s): [facility] Reason for Treatment: Substance Abuse  ADL Screening (condition at time of admission) Patient's cognitive ability adequate to safely complete daily activities?: Yes Patient able to express need for assistance with ADLs?: Yes Independently performs ADLs?: Yes Communication: Independent Dressing (OT): Independent Grooming: Independent Feeding: Independent Bathing: Independent Toileting: Independent In/Out Bed: Independent Walks in Home: Independent  Home Assistive Devices/Equipment Home Assistive Devices/Equipment: None    Abuse/Neglect Assessment (Assessment to be complete while patient is alone) Physical Abuse: Denies Verbal Abuse: Denies Sexual Abuse: Denies Exploitation of patient/patient's resources: Denies Self-Neglect: Denies Values / Beliefs Cultural Requests During Hospitalization: None Spiritual Requests During Hospitalization: None Consults Spiritual Care Consult Needed: No Social Work Consult Needed: No Merchant navy officer (For Healthcare) Advance Directive: Patient does not have advance directive Nutrition Screen Diet: Regular  Additional Information 1:1 In Past 12 Months?: No CIRT Risk: No Elopement Risk: No Does patient have medical clearance?: Yes     Disposition:  Disposition Disposition of Patient: Referred to;Inpatient treatment program Type of inpatient treatment program: Adult Patient referred to: Other (Comment) (Pending [facility])  On Site Evaluation by:   Reviewed with Physician:  Alyson Locket 03/19/2012 12:24 PM

## 2012-03-19 NOTE — ED Notes (Signed)
Breakfast tray ordered 

## 2012-03-19 NOTE — Tx Team (Signed)
Initial Interdisciplinary Treatment Plan  PATIENT STRENGTHS: (choose at least two) Average or above average intelligence Capable of independent living Communication skills Physical Health Religious Affiliation Special hobby/interest Work skills  PATIENT STRESSORS: Financial difficulties Legal issue Occupational concerns Substance abuse Housing   PROBLEM LIST: Problem List/Patient Goals Date to be addressed Date deferred Reason deferred Estimated date of resolution  "Housing" 03/19/12     "I don't know"            Substance abuse alcohol and cocaine 03/19/12     Increased risk for suicide 03/19/12                              DISCHARGE CRITERIA:  Ability to meet basic life and health needs Adequate post-discharge living arrangements Improved stabilization in mood, thinking, and/or behavior Medical problems require only outpatient monitoring Motivation to continue treatment in a less acute level of care Need for constant or close observation no longer present Reduction of life-threatening or endangering symptoms to within safe limits Safe-care adequate arrangements made Verbal commitment to aftercare and medication compliance Withdrawal symptoms are absent or subacute and managed without 24-hour nursing intervention  PRELIMINARY DISCHARGE PLAN: Attend aftercare/continuing care group Attend 12-step recovery group Outpatient therapy Placement in alternative living arrangements  PATIENT/FAMIILY INVOLVEMENT: This treatment plan has been presented to and reviewed with the patient, Ryan Long, and/or family member.  The patient and family have been given the opportunity to ask questions and make suggestions.  Ryan Long [facility] 03/19/2012, 9:24 PM

## 2012-03-19 NOTE — ED Notes (Signed)
Security called for pt transport to [facility].

## 2012-03-19 NOTE — ED Notes (Signed)
Ordered dinner tray @ 15:30

## 2012-03-19 NOTE — ED Notes (Signed)
Diet tray ordered 

## 2012-03-19 NOTE — BH Assessment (Signed)
Assessment Note  Update:  Received call from [facility] stating pt was accepted by Dr. Allena Katz to Dr. Catha Brow to bed 303-1 and that pt could be transported to [facility].  Updated assessment disposition, completed assessment notification, support paperwork and faxed to [facility] to log.  Updated EDP Radford Pax and ED staff.  ED staff to arrange transport via security, as pt is voluntary.     Disposition:  Disposition Disposition of Patient: Inpatient treatment program Type of inpatient treatment program: Adult Patient referred to: Other (Comment) (Pt accepted [facility])  On Site Evaluation by:   Reviewed with Physician:  Earnestine Mealing 03/19/2012 5:45 PM

## 2012-03-19 NOTE — ED Notes (Signed)
RN WAS INFORMED OF PT LOW BP

## 2012-03-19 NOTE — ED Notes (Signed)
Offered pt shower, declined

## 2012-03-20 DIAGNOSIS — F1020 Alcohol dependence, uncomplicated: Secondary | ICD-10-CM | POA: Diagnosis present

## 2012-03-20 DIAGNOSIS — F1410 Cocaine abuse, uncomplicated: Secondary | ICD-10-CM | POA: Diagnosis present

## 2012-03-20 DIAGNOSIS — F319 Bipolar disorder, unspecified: Secondary | ICD-10-CM

## 2012-03-20 DIAGNOSIS — F1420 Cocaine dependence, uncomplicated: Secondary | ICD-10-CM

## 2012-03-20 MED ORDER — CHLORDIAZEPOXIDE HCL 25 MG PO CAPS
25.0000 mg | ORAL_CAPSULE | ORAL | Status: AC
  Administered 2012-03-22 (×2): 25 mg via ORAL
  Filled 2012-03-20 (×3): qty 1

## 2012-03-20 MED ORDER — CHLORDIAZEPOXIDE HCL 25 MG PO CAPS
25.0000 mg | ORAL_CAPSULE | Freq: Four times a day (QID) | ORAL | Status: AC
  Administered 2012-03-20 (×3): 25 mg via ORAL
  Filled 2012-03-20 (×3): qty 1

## 2012-03-20 MED ORDER — CHLORDIAZEPOXIDE HCL 25 MG PO CAPS
25.0000 mg | ORAL_CAPSULE | Freq: Three times a day (TID) | ORAL | Status: AC
  Administered 2012-03-21 (×3): 25 mg via ORAL
  Filled 2012-03-20: qty 1

## 2012-03-20 MED ORDER — CHLORDIAZEPOXIDE HCL 25 MG PO CAPS
25.0000 mg | ORAL_CAPSULE | Freq: Every day | ORAL | Status: AC
  Administered 2012-03-23: 25 mg via ORAL
  Filled 2012-03-20: qty 1

## 2012-03-20 MED ORDER — NICOTINE 21 MG/24HR TD PT24
21.0000 mg | MEDICATED_PATCH | Freq: Every day | TRANSDERMAL | Status: DC
  Administered 2012-03-20 – 2012-03-23 (×4): 21 mg via TRANSDERMAL
  Filled 2012-03-20 (×7): qty 1

## 2012-03-20 NOTE — Progress Notes (Signed)
Pt was in bed upon first assessment and when tried to assess pt he stated,"leave me alone" and "I don't feel good"  He kept saying that over and over.  Then asked if this nurse could just ask him the questions on the self-inventory and again he refused giving the same responses.  Asked pt how were we to take care of him if he wouldn't let us know specifically he issues.  At that point, asked pt if he was suicidal and he stated,"no" so informed him that possibly the doctor could let him go today since we really couldn't help him and he made no other response.  This nurse then walked out his room.  By the time am meds were being passed out, he came to medication window with his self-inventory completed.  He refused some of his medications that were due but at noon he requested a few of them to be given at that time (see mar)  He rated both his depression and hopelessness an 8 and his anxiety a 7 on his self-inventory.  He wants to go to [facility] from here then to an [place] house.  He has not participated in any group activities except for meals today.  No physical complaints except a headache which he has been medicated twice thus far with tylenol (see pain tab).  He denied any S/H ideation or A/V hallucinations.

## 2012-03-20 NOTE — Discharge Planning (Signed)
Pt was not present in morning d/c planning group. Met with pt briefly in room. Pt reports being in the hospital to detox from alcohol. Complained of poor sleep last night as well as significant withdrawal symptoms of agitation, chills, and a headache. Pt was very short and reluctant to engage in conversation and stated "I'm tired of answering the same questions over and over again." CM explained to pt that answering the questions was essential to securing a safe aftercare plan. Pt reports that he came to the [place] area a month ago from [place] and is currently homeless and unemployed due to a job loss in February. Pt requests residential treatment.

## 2012-03-20 NOTE — Progress Notes (Signed)
[facility] Group Notes:  (Counselor/Nursing/MHT/Case Management/Adjunct)  03/20/2012 3:47 PM  Type of Therapy:  1:15PM Group Therapy  Participation Level:  Did Not Attend  Ryan Long 03/20/2012, 3:47 PM

## 2012-03-20 NOTE — Progress Notes (Addendum)
Patient ID: Ryan Long, male   DOB: July 29, 1961, 51 y.o.   MRN: [id number] Subjective: Mr. Mallek has been difficult today stating that he does not feel well.  He complains of Headache, chills, increased irritability, anxiety, depression, agitation, nausea, and poor sleep.  He denies vomiting or diarrhea and states that he may be a little constipated. He has expressed his extreme frustration at being asked " the same questions over and over."  Objective: He is guarded, paranoid, depressed and has poor self esteem.  He does not have much hope for the future and has a very pessimistic negative outlook. His is dismissive and rude and offers little attempt at cooperation. However, once he is confronted and mildly challenged, he capitulates and becomes a little more cooperative.   A.) Chronic polysubstance abuse with significant health problems who is here voluntarily.   P.) Will contact IM tomorrow for direction on current state of care. If symptoms increase or VS worsen he is to be transferred to ED. If he is a non participant in unit programming, he is to be discharged.  He understands this.  Rona Ravens.Nylen Creque PAC

## 2012-03-20 NOTE — Progress Notes (Signed)
[facility] Group Notes:  (Counselor/Nursing/MHT/Case Management/Adjunct)  03/20/2012 10:27 AM   Type of Therapy:  Processing Group at 11:00 am  Participation Level:  Did Not Attend  Ronda Fairly, LCSWA 03/20/2012 10:27 AM
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
## 2012-03-20 NOTE — H&P (Signed)
Psychiatric Admission Assessment Adult  Patient Identification:  Ryan Long Date of Evaluation:  03/20/2012 Chief Complaint:  Substance Abuse History of Present Illness: Pt. Is a 4yr AAMale who presented to the ED requesting help with his long standing alcohol and cocaine problem.  He states he has used 200$ worth of cocaine daily for 4 days now, as well as drinking 2 (fifths) of wine a day, and /or 2 24oz beers.  Ryan Long states he has no SI/HI, has never tried to harm himself, but does want help with substance abuse.  He is homeless as he and his girlfriend broke up recently and he has no where to stay.  He has multiple health problems but no local PCP.  Psychiatric Symptoms:  Denies sx but rates his depression at a8/10 with unemployment, homelessness and legal issues as his biggest emotional burdens at this time.  He does note a court date tomorrow in [facility], but states his Ryan Long will get it continued. Hx of Trauma: (Emotional/Phsycial/Sexual) Past Psychiatric History: "can't remember."  Past Medical History:   Past Medical History  Diagnosis Date  . HIV (human immunodeficiency virus infection)   . Chronic systolic heart failure   . Hypertension   . Genital herpes   . Active smoker   . AR (allergic rhinitis)   . HLD (hyperlipidemia)   . NICM (nonischemic cardiomyopathy)     Echocardiogram 06/28/11: EF 30-35%, mild Ryan, mild LAE;  No CAD by coronary CT angiogram 3/12 at [facility]  . NSVT (nonsustained ventricular tachycardia)   . Depression   . Bipolar 1 disorder   . Alcohol abuse   . Crack cocaine use    Allergies:  No Known Allergies  PTA Medications: Prescriptions prior to admission  Medication Sig Dispense Refill  . albuterol (PROVENTIL HFA;VENTOLIN HFA) 108 (90 BASE) MCG/ACT inhaler Inhale 2 puffs into the lungs as needed. Per pt med is as needed with no frequency      . aspirin 81 MG tablet Take 81 mg by mouth daily.        Marland Kitchen buPROPion  (WELLBUTRIN XL) 150 MG 24 hr tablet Take 150 mg by mouth daily.      . carvedilol (COREG) 6.25 MG tablet Take 6.25 mg by mouth 2 (two) times daily with a meal.      . cetirizine (ZYRTEC) 10 MG tablet Take 10 mg by mouth daily.      . clotrimazole (LOTRIMIN) 1 % cream Apply topically daily.      . digoxin (LANOXIN) 0.125 MG tablet Take 125 mcg by mouth daily.        Marland Kitchen docusate sodium (COLACE) 100 MG capsule Take 100 mg by mouth 2 (two) times daily.      Marland Kitchen emtricitabine-tenofovir (TRUVADA) 200-300 MG per tablet Take 1 tablet by mouth daily.      . famotidine (PEPCID) 20 MG tablet Take 20 mg by mouth 2 (two) times daily.      . furosemide (LASIX) 40 MG tablet Take 40 mg by mouth daily.      Marland Kitchen guaiFENesin (MUCINEX) 600 MG 12 hr tablet Take 1,200 mg by mouth 2 (two) times daily.      Marland Kitchen lisinopril (PRINIVIL,ZESTRIL) 5 MG tablet Take 5 mg by mouth daily.       . Multiple Vitamin (MULTIVITAMIN) tablet Take 1 tablet by mouth daily.        . naltrexone (DEPADE) 50 MG tablet Take 50 mg by mouth daily.      Marland Kitchen  polyethylene glycol (MIRALAX / GLYCOLAX) packet Take 17 g by mouth daily.      . potassium chloride SA (K-DUR,KLOR-CON) 20 MEQ tablet Take 20 mEq by mouth daily.      . raltegravir (ISENTRESS) 400 MG tablet Take 400 mg by mouth 2 (two) times daily.      . ranitidine (ZANTAC) 150 MG tablet Take 150 mg by mouth daily.      . traZODone (DESYREL) 150 MG tablet Take 150 mg by mouth at bedtime.        . valACYclovir (VALTREX) 500 MG tablet Take 500 mg by mouth 2 (two) times daily.        Previous Psychotropic Medications: Welbutrin  Substance Abuse History in the last 12 months:  See above  Social History: Current Place of Residence:   Place of Birth:   Employment: Marital Status:  single Children: Education:   Hotel manager History:   Legal History: Family History:  History reviewed. No pertinent family history.  ROS: As noted in the HPI. PE: Completed in ED. Pt evaluated and results  reviewed.  Mental Status Examination/Evaluation: Appearance: disheveled  Eye Contact::  poor  Speech:  clear  Volume:  normal  Mood:  depressed  Affect:  iritible  Thought Process:  linear  Orientation:  Full x 3  Thought Content:  normal  Suicidal Thoughts:  +SI, plans to jump in front of a bus. No intent.   Homicidal Thoughts:  no  Memory:  poor  Judgement:  poor  Insight:  lacking  Psychomotor Activity:  normal  Concentration:  jpoor  Recall:  poor  Akathisia:  no  Handed:    AIMS (if indicated):     Assets:  Desires iprovement.  Sleep:  Number of Hours: 6    Labs: Xray:  Assessment:    AXIS I:  Cocaine dependency with continuous use, alcohol dependence, Bipolar disorder w/o psychotic featurs AXIS II:  deferred AXIS III:   Past Medical History  Diagnosis Date  . HIV (human immunodeficiency virus infection)   . Chronic systolic heart failure   . Hypertension   . Genital herpes   . Active smoker   . AR (allergic rhinitis)   . HLD (hyperlipidemia)   . NICM (nonischemic cardiomyopathy)     Echocardiogram 06/28/11: EF 30-35%, mild Ryan, mild LAE;  No CAD by coronary CT angiogram 3/12 at [facility]  . NSVT (nonsustained ventricular tachycardia)   . Depression   . Bipolar 1 disorder   . Alcohol abuse   . Crack cocaine use    AXIS IV:  System savvy individual with problems with social environment and primary support group, legal problems, housing problems. AXIS V:  40  Treatment Plan/Recommendations: Admit for crisis stabilization and supportive care to include detox protocol for alcohol dependence, opiate dependence, benzodiazepine dependence as needed. Evaluation and treatment for medical problems associated with current state of health.  Treatment Plan Summary:  Current Medications:  Current Facility-Administered Medications  Medication Dose Route Frequency Provider Last Rate Last Dose  . acetaminophen (TYLENOL) tablet 650 mg  650 mg Oral Q6H  PRN Curlene Labrum Readling, MD   650 mg at 03/20/12 1652  . albuterol (PROVENTIL HFA;VENTOLIN HFA) 108 (90 BASE) MCG/ACT inhaler 2 puff  2 puff Inhalation Q4H Mickie D. Adams, PA   2 puff at 03/20/12 2153  . alum & mag hydroxide-simeth (MAALOX/MYLANTA) 200-200-20 MG/5ML suspension 30 mL  30 mL Oral Q4H PRN Ronny Bacon, MD      .  aspirin chewable tablet 81 mg  81 mg Oral Daily Mickie D. Adams, PA   81 mg at 03/20/12 0830  . buPROPion (WELLBUTRIN XL) 24 hr tablet 150 mg  150 mg Oral Daily Mickie D. Adams, PA   150 mg at 03/20/12 0829  . carvedilol (COREG) tablet 6.25 mg  6.25 mg Oral BID WC Mickie D. Adams, PA   6.25 mg at 03/20/12 1816  . chlordiazePOXIDE (LIBRIUM) capsule 25 mg  25 mg Oral Q6H PRN Curlene Labrum Readling, MD      . chlordiazePOXIDE (LIBRIUM) capsule 25 mg  25 mg Oral QID Alyson Kuroski-Mazzei, DO   25 mg at 03/20/12 2153   Followed by  . chlordiazePOXIDE (LIBRIUM) capsule 25 mg  25 mg Oral TID Alyson Kuroski-Mazzei, DO       Followed by  . chlordiazePOXIDE (LIBRIUM) capsule 25 mg  25 mg Oral BH-qamhs Alyson Kuroski-Mazzei, DO       Followed by  . chlordiazePOXIDE (LIBRIUM) capsule 25 mg  25 mg Oral Daily Alyson Kuroski-Mazzei, DO      . chlordiazePOXIDE (LIBRIUM) capsule 50 mg  50 mg Oral Once Ronny Bacon, MD   50 mg at 03/19/12 2257  . clotrimazole (LOTRIMIN) 1 % cream   Topical Daily Mickie D. Adams, PA      . digoxin (LANOXIN) tablet 125 mcg  125 mcg Oral Daily Mickie D. Adams, PA   125 mcg at 03/20/12 0830  . docusate sodium (COLACE) capsule 100 mg  100 mg Oral BID Mickie D. Adams, PA   100 mg at 03/20/12 1653  . emtricitabine-tenofovir (TRUVADA) 200-300 MG per tablet 1 tablet  1 tablet Oral Daily Mickie D. Adams, PA   1 tablet at 03/20/12 [phone]  . famotidine (PEPCID) tablet 20 mg  20 mg Oral BID Mickie D. Adams, PA   20 mg at 03/20/12 1653  . furosemide (LASIX) tablet 40 mg  40 mg Oral Daily Mickie D. Adams, PA   40 mg at 03/20/12 0830  . guaiFENesin (MUCINEX) 12 hr tablet  1,200 mg  1,200 mg Oral BID Alyson Kuroski-Mazzei, DO   1,200 mg at 03/20/12 1654  . hydrOXYzine (ATARAX/VISTARIL) tablet 25 mg  25 mg Oral Q6H PRN Curlene Labrum Readling, MD      . lisinopril (PRINIVIL,ZESTRIL) tablet 5 mg  5 mg Oral Daily Mickie D. Adams, PA   5 mg at 03/20/12 0830  . loperamide (IMODIUM) capsule 2-4 mg  2-4 mg Oral PRN Curlene Labrum Readling, MD      . loratadine (CLARITIN) tablet 10 mg  10 mg Oral Daily Mickie D. Adams, PA      . magnesium hydroxide (MILK OF MAGNESIA) suspension 30 mL  30 mL Oral Daily PRN Ronny Bacon, MD      . mulitivitamin with minerals tablet 1 tablet  1 tablet Oral Daily Mickie D. Adams, PA   1 tablet at 03/20/12 0829  . naltrexone (DEPADE) tablet 50 mg  50 mg Oral Daily Mickie D. Adams, PA      . nicotine (NICODERM CQ - dosed in mg/24 hours) patch 21 mg  21 mg Transdermal Q0600 Alyson Kuroski-Mazzei, DO   21 mg at 03/20/12 1155  . ondansetron (ZOFRAN-ODT) disintegrating tablet 4 mg  4 mg Oral Q6H PRN Curlene Labrum Readling, MD      . pneumococcal 23 valent vaccine (PNU-IMMUNE) injection 0.5 mL  0.5 mL Intramuscular Tomorrow-1000 Alyson Kuroski-Mazzei, DO      . polyethylene glycol (MIRALAX / GLYCOLAX)  packet 17 g  17 g Oral Daily Mickie D. Adams, PA   17 g at 03/20/12 1209  . potassium chloride SA (K-DUR,KLOR-CON) CR tablet 20 mEq  20 mEq Oral Daily Mickie D. Adams, PA   20 mEq at 03/20/12 0829  . raltegravir (ISENTRESS) tablet 400 mg  400 mg Oral BID Mickie D. Adams, PA   400 mg at 03/20/12 1652  . thiamine (B-1) injection 100 mg  100 mg Intramuscular Once Curlene Labrum Readling, MD      . thiamine (VITAMIN B-1) tablet 100 mg  100 mg Oral Daily Curlene Labrum Readling, MD   100 mg at 03/20/12 0838  . traZODone (DESYREL) tablet 150 mg  150 mg Oral QHS Mickie D. Adams, PA   150 mg at 03/20/12 2153  . valACYclovir (VALTREX) tablet 500 mg  500 mg Oral BID Alyson Kuroski-Mazzei, DO   500 mg at 03/20/12 1653  . DISCONTD: chlordiazePOXIDE (LIBRIUM) capsule 25 mg  25 mg Oral QID Curlene Labrum  Readling, MD   25 mg at 03/20/12 1478  . DISCONTD: chlordiazePOXIDE (LIBRIUM) capsule 25 mg  25 mg Oral TID Ronny Bacon, MD      . DISCONTD: chlordiazePOXIDE (LIBRIUM) capsule 25 mg  25 mg Oral BH-qamhs Curlene Labrum Readling, MD      . DISCONTD: chlordiazePOXIDE (LIBRIUM) capsule 25 mg  25 mg Oral Daily Ronny Bacon, MD      . DISCONTD: mulitivitamin with minerals tablet 1 tablet  1 tablet Oral Daily Ronny Bacon, MD   1 tablet at 03/19/12 2257    Observation Level/Precautions:  routine  Laboratory:       Routine PRN Medications: yes  Consultations:    Discharge Concerns:    Other:      Lloyd Huger T. [place] Jeff PAC For Dr. Lupe Carney  4/3/201310:09 PM

## 2012-03-21 DIAGNOSIS — F411 Generalized anxiety disorder: Secondary | ICD-10-CM

## 2012-03-21 DIAGNOSIS — F1410 Cocaine abuse, uncomplicated: Secondary | ICD-10-CM

## 2012-03-21 NOTE — Progress Notes (Signed)
Cosigned by Carney Bern, LCSWA 4/4/201310:06 AM

## 2012-03-21 NOTE — Discharge Planning (Signed)
Per MSW Intern Tanya Nones, who has met with patient today, he initially wanted to go to [facility].  Appointment was set for 04/02/12 at 8AM for assessment/admission to that rehab program.  Later in the day, patient decided he no longer wants to go to rehab, but prefers instead to go to a counselor and live in an [address].  Intern provided patient with list of [employer] as well as information about [employer] [facility] [employer] of the [place].    No further case management needs today.  Ambrose Mantle, LCSW 03/21/2012, 3:57 PM

## 2012-03-21 NOTE — Progress Notes (Signed)
Patient ID: Ryan Long, male   DOB: Jan 05, 1961, 51 y.o.   MRN: [id number]  Pt is still guarded with the writer and does not engage in conversation. Support and encouragement was offered.

## 2012-03-21 NOTE — Progress Notes (Signed)
[facility] Group Notes:  (Counselor/Nursing/MHT/Case Management/Adjunct)  03/21/2012 3:58 PM  Type of Therapy:  1:15PM Group Therapy  Participation Level:  Did Not Attend  Wilmon Arms 03/21/2012, 3:58 PM

## 2012-03-21 NOTE — Progress Notes (Addendum)
Pt is irritable and wants to go home. Pt early today would not contract for safety and said he had a plan but then after talking with staff was able to contract. Pt does attend groups. Pt was offered support and encouragement. Pt denies SI/HI.

## 2012-03-21 NOTE — Progress Notes (Signed)
[facility] Group Notes:  (Counselor/Nursing/MHT/Case Management/Adjunct)  03/21/2012 8:03 PM  Type of Therapy:  Group Therapy at 11 AM  Participation Level:  None  Participation Quality:  Drowsy  Affect:  Flat  Cognitive:  Oriented  Insight:  None shared  Engagement in Group:  None  Engagement in Therapy:  None  Modes of Intervention:  Socialization and Support  Summary of Progress/Problems:  Patient appeared drowsy, in attentive and left group early   Ryan Long 03/21/2012, 8:03 PM

## 2012-03-21 NOTE — [facility] Counselor (Signed)
Adult Comprehensive Assessment  Patient ID: Ryan Long, male   DOB: [birth year], 51 y.o.   MRN: [id number]  Information Source: Information source: Patient  Current Stressors:  Educational / Learning stressors: None Reported Employment / Job issues: Patient reports he lost his job of 8 years. Family Relationships: None Reported Financial / Lack of resources (include bankruptcy): None Reported Housing / Lack of housing: None Reported Physical health (include injuries & life threatening diseases): None Reported Social relationships: None Reported Substance abuse: Patient reports he has history of using crack cocaine and marajuana. Bereavement / Loss: None Reported  Living/Environment/Situation:  Living Arrangements: Homeless Living conditions (as described by patient or guardian): None Reported How long has patient lived in current situation?: None Reported What is atmosphere in current home: Dangerous  Family History:  Marital status: Single Does patient have children?: No  Childhood History:  By whom was/is the patient raised?: Both parents Description of patient's relationship with caregiver when they were a child: Patient refused to answer. Patient's description of current relationship with people who raised him/her: No relationship ... "They're both deceased. Does patient have siblings?: Yes Number of Siblings: 1  (Brother) Description of patient's current relationship with siblings: Patient reports No communication/No relationship with sibling. Did patient suffer any verbal/emotional/physical/sexual abuse as a child?:  (Patient refused to answer.) Did patient suffer from severe childhood neglect?:  (Patient refused to answer.) Has patient ever been sexually abused/assaulted/raped as an adolescent or adult?:  (Patient refused to answer.) Was the patient ever a victim of a crime or a disaster?:  (Patient refused to answer.) Witnessed domestic violence?:  (Patient refused to  answer.) Has patient been effected by domestic violence as an adult?:  (Patient refused to answer.)  Education:  Highest grade of school patient has completed: Patient refused to answer. 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  Leisure/Recreation:   Leisure and Hobbies: Sports  Strengths/Needs:   What things does the patient do well?: Hard working and Education officer, environmental. In what areas does patient struggle / problems for patient: "I don't know".  Discharge Plan:   Does patient have access to transportation?: No Plan for no access to transportation at discharge: Patient unsure at this time. Will patient be returning to same living situation after discharge?: No Plan for living situation after discharge: Patient is currently homeless. Currently receiving community mental health services: Yes (From Whom) Vesta Mixer) If no, would patient like referral for services when discharged?: Yes (What county?) ([place] Does patient have financial barriers related to discharge  medications?:  (Patient refused to answer.)  Summary/Recommendations:   Summary and Recommendations (to be completed by the evaluator): Patient  is a 51 year old male. Patient admitted with diagnosis of Alcohol Dependence, Cocaine Dependence. Patient reports he abuses crack cocaine and currently seeking detox. Patient would benefit from crisis stabilization, medication evaluation, psycho ed and group therapy, and case management for discharge planning.  Clide Dales. 03/21/2012

## 2012-03-21 NOTE — Progress Notes (Signed)
[facility] MD Progress Note  03/21/2012 11:27 PM  Diagnosis:  Cocaine abuse episodic                      Anxiety due to chronic medical condition  ADL's:  intact  Sleep: fair  Appetite:  fair  Suicidal Ideation:  Yes, with plan, no intent, no means Homicidal Ideation:  None  AEB (as evidenced by):    Patient states he is currently overwhelmed and does not feel he can be safe.  He states he can not see any reason to go on.  He states he is hopeless and worthless.  There is nothing to live for.  He denies any new stressors in the last 24 hours. But he does want help with his depression.  He states he does believe in a higher power, which is what he reports has kept him from attempting suicide in the past. Mental Status Examination/Evaluation:    He is oriented x3. Wearing paper scrubs. Eye contact is fair, speech clear, goal directed, normal rate and rhythm. Mood is depressed affect is congruent. Insight is lacking, judgement is poor.  Thought process is linear, and he is in full touch with reality. His cognition is at least average.  He is negative and pessimistic, and has little hope for the future.     [place] has a negative perception of himself, and perceives the most generic of comments as derrogatory.     Vital Signs:Blood pressure 91/66, pulse 108, temperature 98.3 F (36.8 C), temperature source Oral, resp. rate 19, height 5' 9.5" (1.765 m), weight 91.627 kg (202 lb). Current Medications: Current Facility-Administered Medications  Medication Dose Route Frequency Provider Last Rate Last Dose  . acetaminophen (TYLENOL) tablet 650 mg  650 mg Oral Q6H PRN Curlene Labrum Readling, MD   650 mg at 03/21/12 1105  . albuterol (PROVENTIL HFA;VENTOLIN HFA) 108 (90 BASE) MCG/ACT inhaler 2 puff  2 puff Inhalation Q4H Mickie D. Adams, PA   2 puff at 03/21/12 2000  . alum & mag hydroxide-simeth (MAALOX/MYLANTA) 200-200-20 MG/5ML suspension 30 mL  30 mL Oral Q4H PRN Curlene Labrum Readling, MD      . aspirin  chewable tablet 81 mg  81 mg Oral Daily Mickie D. Adams, PA   81 mg at 03/21/12 0944  . buPROPion (WELLBUTRIN XL) 24 hr tablet 150 mg  150 mg Oral Daily Mickie D. Adams, PA   150 mg at 03/21/12 0945  . carvedilol (COREG) tablet 6.25 mg  6.25 mg Oral BID WC Mickie D. Adams, PA   6.25 mg at 03/21/12 1735  . chlordiazePOXIDE (LIBRIUM) capsule 25 mg  25 mg Oral Q6H PRN Curlene Labrum Readling, MD      . chlordiazePOXIDE (LIBRIUM) capsule 25 mg  25 mg Oral TID Alyson Kuroski-Mazzei, DO   25 mg at 03/21/12 1732   Followed by  . chlordiazePOXIDE (LIBRIUM) capsule 25 mg  25 mg Oral BH-qamhs Alyson Kuroski-Mazzei, DO       Followed by  . chlordiazePOXIDE (LIBRIUM) capsule 25 mg  25 mg Oral Daily Alyson Kuroski-Mazzei, DO      . clotrimazole (LOTRIMIN) 1 % cream   Topical Daily Mickie D. Adams, PA      . digoxin (LANOXIN) tablet 125 mcg  125 mcg Oral Daily Mickie D. Adams, PA   125 mcg at 03/21/12 0946  . docusate sodium (COLACE) capsule 100 mg  100 mg Oral BID Mickie D. Adams, PA   100 mg at  03/21/12 1732  . emtricitabine-tenofovir (TRUVADA) 200-300 MG per tablet 1 tablet  1 tablet Oral Daily Mickie D. Adams, PA   1 tablet at 03/21/12 0951  . famotidine (PEPCID) tablet 20 mg  20 mg Oral BID Mickie D. Adams, PA   20 mg at 03/21/12 1732  . furosemide (LASIX) tablet 40 mg  40 mg Oral Daily Mickie D. Adams, PA   40 mg at 03/21/12 0945  . guaiFENesin (MUCINEX) 12 hr tablet 1,200 mg  1,200 mg Oral BID Alyson Kuroski-Mazzei, DO   1,200 mg at 03/21/12 1732  . hydrOXYzine (ATARAX/VISTARIL) tablet 25 mg  25 mg Oral Q6H PRN Curlene Labrum Readling, MD      . lisinopril (PRINIVIL,ZESTRIL) tablet 5 mg  5 mg Oral Daily Mickie D. Adams, PA   5 mg at 03/21/12 0943  . loperamide (IMODIUM) capsule 2-4 mg  2-4 mg Oral PRN Curlene Labrum Readling, MD      . loratadine (CLARITIN) tablet 10 mg  10 mg Oral Daily Mickie D. Adams, PA   10 mg at 03/21/12 0944  . magnesium hydroxide (MILK OF MAGNESIA) suspension 30 mL  30 mL Oral Daily PRN Ronny Bacon, MD      . mulitivitamin with minerals tablet 1 tablet  1 tablet Oral Daily Mickie D. Adams, PA   1 tablet at 03/21/12 0945  . naltrexone (DEPADE) tablet 50 mg  50 mg Oral Daily Mickie D. Adams, PA   50 mg at 03/21/12 0944  . nicotine (NICODERM CQ - dosed in mg/24 hours) patch 21 mg  21 mg Transdermal Q0600 Alyson Kuroski-Mazzei, DO   21 mg at 03/21/12 0636  . ondansetron (ZOFRAN-ODT) disintegrating tablet 4 mg  4 mg Oral Q6H PRN Curlene Labrum Readling, MD      . pneumococcal 23 valent vaccine (PNU-IMMUNE) injection 0.5 mL  0.5 mL Intramuscular Tomorrow-1000 Alyson Kuroski-Mazzei, DO      . polyethylene glycol (MIRALAX / GLYCOLAX) packet 17 g  17 g Oral Daily Mickie D. Adams, PA   17 g at 03/21/12 0950  . potassium chloride SA (K-DUR,KLOR-CON) CR tablet 20 mEq  20 mEq Oral Daily Mickie D. Adams, PA   20 mEq at 03/21/12 0943  . raltegravir (ISENTRESS) tablet 400 mg  400 mg Oral BID Mickie D. Adams, PA   400 mg at 03/21/12 1732  . thiamine (B-1) injection 100 mg  100 mg Intramuscular Once Curlene Labrum Readling, MD      . thiamine (VITAMIN B-1) tablet 100 mg  100 mg Oral Daily Curlene Labrum Readling, MD   100 mg at 03/21/12 0945  . traZODone (DESYREL) tablet 150 mg  150 mg Oral QHS Mickie D. Adams, PA   150 mg at 03/21/12 2200  . valACYclovir (VALTREX) tablet 500 mg  500 mg Oral BID Alyson Kuroski-Mazzei, DO   500 mg at 03/21/12 1735    Lab Results: No results found for this or any previous visit (from the past 48 hour(s)).  Physical Findings: AIMS:  , ,  ,  ,    CIWA:  CIWA-Ar Total: 0  COWS:  COWS Total Score: 1   Treatment Plan Summary:  After 40 minutes with [name] he does agree to contract for safety and promises me he will contact a staff member if he feels unsafe.  We will consider options for treating his depression with medications that won't interact with his antivirals, and will not diminish his sex drive, at his request.  Plan: Will consider  adding SSRI to Welbutrin vs. Antipsychotic for  adjuvant treatment, such as Abilify. Will also check testosterone levels, TSH.  Mynor Witkop 03/21/2012, 11:27 PM

## 2012-03-21 NOTE — Progress Notes (Signed)
Cosigned by Carney Bern, LCSWA 4/4/20138:03 PM

## 2012-03-21 NOTE — Progress Notes (Addendum)
Patient was very irritable this morning.  He was demanding to have his phone out of his locker so that he could get some phone numbers.  The charge nurse allowed him to do this.  Not attending groups today.  Continues to complain of headache.  Received Tylenol once for headache.  Patient initially admitted to suicidal ideation and would not share plan with staff.  He also would not contract for safety.  This was reported to [place], [place] who spent some time with the patient.  He was finally able to contract for safety and has not expressed further suicidal thoughts.

## 2012-03-22 LAB — TESTOSTERONE: Testosterone: 470.65 ng/dL (ref 300–890)

## 2012-03-22 LAB — TSH: TSH: 0.21 u[IU]/mL — ABNORMAL LOW (ref 0.350–4.500)

## 2012-03-22 NOTE — Tx Team (Signed)
Interdisciplinary Treatment Plan Update (Adult)  Date:  03/22/2012  Time Reviewed:  10:15AM-11:00AM  Progress in Treatment: Attending groups:  Yes Participating in groups:    Yes Taking medication as prescribed:    Yes Tolerating medication:   Yes Family/Significant other contact made:  No, is not cooperative with this Patient understands diagnosis:   Yes with poor insight Discussing patient identified problems/goals with staff:   Yes, fully engaged Medical problems stabilized or resolved:   Yes, on medications Denies suicidal/homicidal ideation:  Yes Issues/concerns per patient self-inventory:   None Other:    New problem(s) identified: No, Describe:  Patient continues to demand discharge  Reason for Continuation of Hospitalization: Withdrawal symptoms Other; describe Still in detox  Interventions implemented related to continuation of hospitalization:  Medication monitoring and adjustment, safety checks Q15 min., suicide risk assessment, group therapy, psychoeducation, collateral contact, aftercare planning, ongoing physician assessments, medication education  Additional comments:  Not applicable  Estimated length of stay:  2-3 days  Discharge Plan:  Undecided, as patient is homeless.   He is looking at the possibility of going to an [address], and has been calling some.  Follows up at [facility] for medication.  Has an appointment at [facility] on 04/03/12.  New goal(s):  Not applicable  Review of initial/current patient goals per problem list:   1.  Goal(s):  Complete detox safely.  Met:  No  Target date:  By Discharge   As evidenced by:  Still has several days of detox protocol.  2.  Goal(s):  Decide if & how to address substance abuse issues at discharge.  Met:  No  Target date:  By Discharge   As evidenced by:  Has an appointment at [facility] on 4/17.  Asks for an evening outpatient class, but is sponsored and this will have to be researched as  to availability.  Is not willing at the present time to go to residential rehab.  Does not think he can do 28 days in rehab, states this repeatedly.  3.  Goal(s):  Determine housing situation at discharge.  Met:  No  Target date:  By Discharge   As evidenced by:  Has called [employer], has hope now, he says.  4.  Goal(s):  Deny suicide ideation for 48 hours prior to discharge.  Met:  Yes  Target date:  By Discharge   As evidenced by:  Denies SI  5.  Goal(s):  Decrease depression from "9" on 03/21/12 to no greater than 3 at discharge.  Met: Yes  Target date:  By Discharge   As evidenced by:  "0" before coming to treatment team, says tx tm is negative, and now becoming depressed  Attendees: Patient:  Ryan Long  03/22/2012 10:04 AM   Family:     Physician:     Nursing:      Case Manager:  Ambrose Mantle, LCSW 03/22/2012 10:04 AM   Counselor:  Ronda Fairly, LCSW 4/5/201310:04 AM   Other:   Verne Spurr, PA 03/22/2012 10:04 AM   Other:   Leroy Sea, MSW-I 03/22/2012 10:04 AM   Other:   Reyes Ivan, LCSW-A 03/22/2012 10:05 AM   Other:       Scribe for Treatment Team:   Sarina Ser, 03/22/2012, 10:15AM-11:15AM

## 2012-03-22 NOTE — Progress Notes (Signed)
Cosigned by Carney Bern, LCSWA 4/5/20134:32 PM

## 2012-03-22 NOTE — Progress Notes (Signed)
Pt in bed on approach, minimal contact.   Pt did not attend night time group.  No acute distress noted, respirations even and unlabored.  Will continue to monitor.

## 2012-03-22 NOTE — Progress Notes (Signed)
Resting quietly with eyes closed. Respirations even and unlabored. No distress noted. Q 15 minute check continues to maintain safety 

## 2012-03-22 NOTE — Progress Notes (Signed)
[facility] Group Notes:  (Counselor/Nursing/MHT/Case Management/Adjunct)  03/22/2012 3:25 PM  Type of Therapy:  1:15PM Group Therapy  Participation Level:  Minimal  Participation Quality:  Appropriate and Attentive  Affect:  Appropriate  Cognitive:  Alert and Appropriate  Insight:  Limited  Engagement in Group:  Limited  Engagement in Therapy:  Limited  Modes of Intervention:  Education and Exploration  Summary of Progress/Problems: Patient was attentive to educational DVD on vulnerability. Patient seemed to relate well to peers in discussing vulnerability and relapse. Patient stated he views being vulnerable in a positive perspective. Patient states by acknowledging the fact that he is vulnerable to relapse, allows him to be aware and cautious.   Wilmon Arms 03/22/2012, 3:25 PM

## 2012-03-22 NOTE — Progress Notes (Signed)
[facility] Group Notes:  (Counselor/Nursing/MHT/Case Management/Adjunct)  03/22/2012 1:43 PM  Type of Therapy:  Group Therapy  Participation Level:  Minimal  Participation Quality:  Attentive  Affect:  Flat  Cognitive:  Oriented  Insight:  None shared  Engagement in Group:  None other than attendance  Engagement in Therapy:  None  Modes of Intervention:  Education and Support  Summary of Progress/Problems: Patient came into group late and appeared to pay attention.Patient remained calm despite frustration due to treatment team meeting in which suggestion was to go to 28 day program which he did not want to attend verses [employer] interview and job contact he was more motivated for   Clide Dales 03/22/2012, 1:43 PM

## 2012-03-22 NOTE — Progress Notes (Signed)
[facility] Adult Inpatient Family/Significant Other Suicide Prevention Education  Suicide Prevention Education:  Patient Refusal for Family/Significant Other Suicide Prevention Education: The patient Ryan Long has refused to provide written consent for family/significant other to be provided Family/Significant Other Suicide Prevention Education during admission and/or prior to discharge.  Physician notified. Writer provided suicide prevention education directly to patient; conversation included risk factors, warning signs and resources to contact for help. Mobile crisis services explained and contact card placed in chart for pt to receive at discharge.   Clide Dales 03/22/2012, 7:05 PM

## 2012-03-22 NOTE — Discharge Planning (Signed)
Met with patient in Treatment Team.  Demanding discharge.  At patient request, sent letter to his Public Defender in [place] about missing court yesterday.  Did utilization review for more days.  Ambrose Mantle, LCSW 03/22/2012, 12:40 PM

## 2012-03-23 DIAGNOSIS — F1420 Cocaine dependence, uncomplicated: Secondary | ICD-10-CM

## 2012-03-23 DIAGNOSIS — F1020 Alcohol dependence, uncomplicated: Principal | ICD-10-CM

## 2012-03-23 MED ORDER — FAMOTIDINE 20 MG PO TABS: 20.0000 mg | ORAL_TABLET | Freq: Two times a day (BID) | ORAL | Status: DC

## 2012-03-23 MED ORDER — BUPROPION HCL ER (XL) 150 MG PO TB24: 150.0000 mg | ORAL_TABLET | Freq: Every day | ORAL | Status: DC

## 2012-03-23 MED ORDER — ASPIRIN 81 MG PO TABS: 81.0000 mg | ORAL_TABLET | Freq: Every day | ORAL | Status: DC

## 2012-03-23 MED ORDER — EMTRICITABINE-TENOFOVIR DF 200-300 MG PO TABS: 1.0000 | ORAL_TABLET | Freq: Every day | ORAL | Status: DC

## 2012-03-23 MED ORDER — TRAZODONE HCL 150 MG PO TABS: 150.0000 mg | ORAL_TABLET | Freq: Every day | ORAL | Status: DC

## 2012-03-23 MED ORDER — NICOTINE 21 MG/24HR TD PT24: 1.0000 | MEDICATED_PATCH | Freq: Every day | TRANSDERMAL | Status: DC

## 2012-03-23 MED ORDER — POTASSIUM CHLORIDE CRYS ER 20 MEQ PO TBCR: 20.0000 meq | EXTENDED_RELEASE_TABLET | Freq: Every day | ORAL | Status: DC

## 2012-03-23 MED ORDER — GUAIFENESIN ER 600 MG PO TB12: 1200.0000 mg | ORAL_TABLET | Freq: Two times a day (BID) | ORAL | Status: DC

## 2012-03-23 MED ORDER — DIGOXIN 125 MCG PO TABS: 125.0000 ug | ORAL_TABLET | Freq: Every day | ORAL | Status: DC

## 2012-03-23 MED ORDER — LISINOPRIL 5 MG PO TABS: 5.0000 mg | ORAL_TABLET | Freq: Every day | ORAL | Status: DC

## 2012-03-23 MED ORDER — POLYETHYLENE GLYCOL 3350 17 G PO PACK: 17.0000 g | PACK | Freq: Every day | ORAL | Status: DC

## 2012-03-23 MED ORDER — DOCUSATE SODIUM 100 MG PO CAPS: 100.0000 mg | ORAL_CAPSULE | Freq: Two times a day (BID) | ORAL | Status: DC

## 2012-03-23 MED ORDER — VALACYCLOVIR HCL 500 MG PO TABS: 500.0000 mg | ORAL_TABLET | Freq: Two times a day (BID) | ORAL | Status: DC

## 2012-03-23 MED ORDER — CETIRIZINE HCL 10 MG PO TABS: 10.0000 mg | ORAL_TABLET | Freq: Every day | ORAL | Status: DC

## 2012-03-23 MED ORDER — CLOTRIMAZOLE 1 % EX CREA: TOPICAL_CREAM | CUTANEOUS | Status: DC

## 2012-03-23 MED ORDER — NALTREXONE HCL 50 MG PO TABS: 50.0000 mg | ORAL_TABLET | Freq: Every day | ORAL | Status: DC

## 2012-03-23 MED ORDER — RALTEGRAVIR POTASSIUM 400 MG PO TABS: 400.0000 mg | ORAL_TABLET | Freq: Two times a day (BID) | ORAL | Status: DC

## 2012-03-23 MED ORDER — ONE-DAILY MULTI VITAMINS PO TABS: 1.0000 | ORAL_TABLET | Freq: Every day | ORAL | Status: DC

## 2012-03-23 MED ORDER — ALBUTEROL SULFATE HFA 108 (90 BASE) MCG/ACT IN AERS: 2.0000 | INHALATION_SPRAY | RESPIRATORY_TRACT | Status: DC | PRN

## 2012-03-23 MED ORDER — RANITIDINE HCL 150 MG PO TABS: 150.0000 mg | ORAL_TABLET | Freq: Every day | ORAL | Status: DC

## 2012-03-23 MED ORDER — FUROSEMIDE 40 MG PO TABS: 40.0000 mg | ORAL_TABLET | Freq: Every day | ORAL | Status: DC

## 2012-03-23 MED ORDER — CARVEDILOL 6.25 MG PO TABS: 6.2500 mg | ORAL_TABLET | Freq: Two times a day (BID) | ORAL | Status: DC

## 2012-03-23 NOTE — Progress Notes (Signed)
72 hour request for d/c signed. [facility] contact given to CM per pt request.

## 2012-03-23 NOTE — Discharge Summary (Signed)
I interviewed the patient on the day of discharge and I agree with the findings above. Please see SRA for details on suicide risk. At this time, Ryan Long is at low acute risk of harm to self and is safe for discharge to community care.  Eligah East, MD

## 2012-03-23 NOTE — Progress Notes (Signed)
Patient ID: Ryan Long, male   DOB: Mar 16, 1961, 51 y.o.   MRN: [id number] Pt cooperative during d/c process. Denies SI/HI. Reviewed all f/u plans and pt verbalized understanding.  Multiple medication bottles returned as well as home medications from drawer and were verified by patient.  All belongings returned. Pt was given bus ticket and transported to bus stop via security.

## 2012-03-23 NOTE — Progress Notes (Signed)
Patient ID: Ryan Long, male   DOB: [birth year], 51 y.o.   MRN: [id number] Pt. Began to complain about being d/c today. In after care planning group the pt. Became irritated and left group. Nurses talked with the pt. And pt. Stated he had a appointment with [facility] on [place]. At 6:30 p.m. The pt. Spoke about having talked with [facility] on Friday about this. Pt.'s nurse Georganna Skeans came to talk to counselor  about pt. Wanting to be d/c and gave number or the [place] [facility] that the pt.. Had called on 03-22-12. [facility] was called-[phone] and call was returned by Hoover Browns), a resident there who confirmed talking to the pt. yesterday at 10:30 pm. Aew stated that the pt. Would be interviewed by staff at [facility] house this evening. The doctor on call and PA, Lloyd Huger was informed of this phone conversation. Lloyd Huger had stated  that pt. Could possibly be d/c today and that he was supposed to be d/c even though Deadrick Stidd the counselor pointed out that the pt.'s case manger's notes had said that pt. was not at a weekend d/c . Lloyd Huger stated that pt.was supposed to be d/c yesterday but that it did not happen. Pt. Was talked to by counselor/case manger and told of follow up appointment, given SI pamphlet and crisis numbers, when he agreed to use if needed. Pt. Denied any SI or HI and stated he was ready for d/c. Pt. Stated that if interview did not work out that he would go to a homeless shelter as a back up plan but felt sure that he would get into the [facility] after talking with Cecille Po Pt. Was told dc was up to doctor after he had met with him. Pt.'s nurse was called at 12:50 p.m and stated that doctor felt pt. Was ready for d/c and that doctor had written orders.bout a interview that was scheduled for 03-23-12 nat 6;30 p.m. Kenard Gower stated that pt. Was not guaranteed a space but that it was interview and did not for see any problems with the pt. Being accepted.Dr

## 2012-03-23 NOTE — Discharge Summary (Signed)
Physician Discharge Summary Note  Patient:  Ryan Long is an 51 y.o., male MRN:  [id number] DOB:  09-02-1961 Patient phone:  [phone] (home)  Patient address:   [address] [place] [address],   Date of Admission:  03/19/2012 Date of Discharge: 03/23/2012  Reason for Admission: alcohol detox, cocaine abuse  Discharge Diagnoses: Active Problems:  Cocaine abuse, episodic use  Alcohol dependence   Axis Diagnosis:   AXIS I:  Alcohol Abuse AXIS II:  Borderline Personality Dis. and Cluster B Traits AXIS III:   Past Medical History  Diagnosis Date  . HIV (human immunodeficiency virus infection)   . Chronic systolic heart failure   . Hypertension   . Genital herpes   . Active smoker   . AR (allergic rhinitis)   . HLD (hyperlipidemia)   . NICM (nonischemic cardiomyopathy)     Echocardiogram 06/28/11: EF 30-35%, mild MR, mild LAE;  No CAD by coronary CT angiogram 3/12 at [facility]  . NSVT (nonsustained ventricular tachycardia)   . Depression   . Bipolar 1 disorder   . Alcohol abuse   . Crack cocaine use    AXIS IV:  housing problems, problems related to legal system/crime, problems with access to health care services and problems with primary support group AXIS V:  51-60 moderate symptoms  Level of Care:  OP  Hospital Course:  Ryan Long was admitted due to request for detox from alcohol and cocaine.  He was difficult to work with, oppositional with staff, rude to other patients.  He cooperated briefly on the second day of his admission, stated he was tearful and had a significant amount of depression and would like another medication, but did not want it to interfere with his sex drive.  He was also anxious over his court appearance which was deferred after contact was made with his attorney's office.  He became much more anxious to be discharged the next day. His participation was minimal, his motivation for sobriety was minimal, and his request for discharge was  granted.  On the day of discharge he denied SI/HI, he was in full contact with reality, voiced no AH/VH.  He was seen by the MD for [facility] and felt safe to be discharge.  Consults:  None  Significant Diagnostic Studies:  None  Discharge Vitals:   Blood pressure 126/84, pulse 102, temperature 96.9 F (36.1 C), temperature source Oral, resp. rate 18, height 5' 9.5" (1.765 m), weight 91.627 kg (202 lb).  Mental Status Exam: See Mental Status Examination and Suicide Risk Assessment completed by Attending Physician prior to discharge.  Discharge destination:  Home  Is patient on multiple antipsychotic therapies at discharge:  No   Has Patient had three or more failed trials of antipsychotic monotherapy by history:  No  Recommended Plan for Multiple Antipsychotic Therapies: None  Discharge Orders    Future Orders Please Complete By Expires   Diet - low sodium heart healthy      Increase activity slowly      Discharge instructions      Comments:   Follow up with your primary care Dr. Or walk in at [facility].     Medication List  As of 03/23/2012 12:15 PM   TAKE these medications      Indication    albuterol 108 (90 BASE) MCG/ACT inhaler   Commonly known as: PROVENTIL HFA;VENTOLIN HFA   Inhale 2 puffs into the lungs as needed. Per pt med is as needed with no  frequency for asthma.       aspirin 81 MG tablet   Take 1 tablet (81 mg total) by mouth daily. For decreased platelet aggregation.       buPROPion 150 MG 24 hr tablet   Commonly known as: WELLBUTRIN XL   Take 1 tablet (150 mg total) by mouth daily. For depression.       carvedilol 6.25 MG tablet   Commonly known as: COREG   Take 1 tablet (6.25 mg total) by mouth 2 (two) times daily with a meal. For hypertension.       cetirizine 10 MG tablet   Commonly known as: ZYRTEC   Take 1 tablet (10 mg total) by mouth daily. For seasonal allergies.       clotrimazole 1 % cream   Commonly known as: LOTRIMIN   Apply daily for fungal  infection.       digoxin 0.125 MG tablet   Commonly known as: LANOXIN   Take 1 tablet (125 mcg total) by mouth daily. For control of heart arrythmia.       docusate sodium 100 MG capsule   Commonly known as: COLACE   Take 1 capsule (100 mg total) by mouth 2 (two) times daily. For constipation.       emtricitabine-tenofovir 200-300 MG per tablet   Commonly known as: TRUVADA   Take 1 tablet by mouth daily. For HIV       famotidine 20 MG tablet   Commonly known as: PEPCID   Take 1 tablet (20 mg total) by mouth 2 (two) times daily. For GERD.       furosemide 40 MG tablet   Commonly known as: LASIX   Take 1 tablet (40 mg total) by mouth daily. For edema       guaiFENesin 600 MG 12 hr tablet   Commonly known as: MUCINEX   Take 2 tablets (1,200 mg total) by mouth 2 (two) times daily. For cough       lisinopril 5 MG tablet   Commonly known as: PRINIVIL,ZESTRIL   Take 1 tablet (5 mg total) by mouth daily. For hypertension       multivitamin tablet   Take 1 tablet by mouth daily. For vitamin deficiency       naltrexone 50 MG tablet   Commonly known as: DEPADE   Take 1 tablet (50 mg total) by mouth daily. For withdrawal.       nicotine 21 mg/24hr patch   Commonly known as: NICODERM CQ - dosed in mg/24 hours   Place 1 patch onto the skin daily at 6 (six) AM. For nicotine withdrawal       polyethylene glycol packet   Commonly known as: MIRALAX / GLYCOLAX   Take 17 g by mouth daily. For constipation.       potassium chloride SA 20 MEQ tablet   Commonly known as: K-DUR,KLOR-CON   Take 1 tablet (20 mEq total) by mouth daily. For low potassium.       raltegravir 400 MG tablet   Commonly known as: ISENTRESS   Take 1 tablet (400 mg total) by mouth 2 (two) times daily. For HIV       ranitidine 150 MG tablet   Commonly known as: ZANTAC   Take 1 tablet (150 mg total) by mouth daily. For acid reflux       traZODone 150 MG tablet   Commonly known as: DESYREL   Take 1 tablet (150 mg  total) by mouth at  bedtime. For sleep.       valACYclovir 500 MG tablet   Commonly known as: VALTREX   Take 1 tablet (500 mg total) by mouth 2 (two) times daily. For Herpes.            Follow-up Information    Follow up with [facility] on 04/02/2012. (Screening with Don at 8AM sharp)    Contact information:   [address]  [place]  [phone]      Follow up with [facility]. (Monday-Friday 8AM-12PM and 1PM-3PM)    Contact information:   [facility] [address] [place]  [address] Telephone:  [phone]      Follow up with [facility] on 03/25/2012. (walk-in Monday-Friday betweent the hours of  8AM and 5PM)    Contact information:   [address] [place], [place] [address]  Phone [phone]           Follow-up recommendations:  As above.  Comments:  Due to his poor motivation he is at risk for relapse.  It is recommended that he has reached his maximum therapeutic benefits at [facility] and should not be considered for admission.  Signed: Kiarrah Rausch For Dr. Eligah East 03/23/2012, 12:15 PM

## 2012-03-23 NOTE — Progress Notes (Signed)
[facility] Group Notes:  (Counselor/Nursing/MHT/Case Management/Adjunct)  03/23/2012 4:18 PM  Type of Therapy:  Group Therapy  Participation Level:  Did Not Attend   Neila Gear 03/23/2012, 4:18 PM
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
## 2012-03-23 NOTE — Progress Notes (Signed)
[facility] Group Notes:  (Counselor/Nursing/MHT/Case Management/Adjunct)  03/23/2012 11:26 AM  Type of Therapy:  Discharge Planning / Suicide Prevention  Participation Level:  Resistant  Participation Quality:  Inappropriate - Disruptive  Affect:  Angry  Cognitive:  Alert and Oriented   Insight:  None  Engagement in Group:  Poor   Engagement in Therapy:  Poor  Modes of Intervention:  Clarification, Education, Orientation, Problem-solving and Support  Summary of Progress/Problems:  Pt. attended aftercare planning group. Pt. Complained that he was ready to leave right now and stated that the [facility] on [place] was waiting for him.  Therapist informed patient he was not scheduled for discharge and that he could discuss that with the MD when he arrived.  Pt started cursing and demanding to see the MD.  He was asked to leave the group and go talk with his nurse.   Marni Griffon C 03/23/2012, 11:26 AM

## 2012-03-23 NOTE — Progress Notes (Signed)
[facility] Case Management Discharge Plan:  Will you be returning to the same living situation after discharge: Pt. Has a interview with [phone])- at 6:30 p.m. On 03/23/12 . The contact person that counselor spoke with was Cecille Po, whom the pt. Spoke to at 10:30 p.m. on 03/22/12.  At discharge, do you have transportation home? No,  Pt. Was given a Bus Pass Do you have the ability to pay for your medications:Yes,    Interagency Information:     Release of information consent forms completed and in the chart;  Patient's signature needed at discharge.  Patient to Follow up at:  Follow-up Information    Follow up with [facility] on 04/02/2012. (Screening with Don at 8AM sharp)    Contact information:   [address]  [place]  [phone]      Follow up with [facility]. (Monday-Friday 8AM-12PM and 1PM-3PM)    Contact information:   [facility] [address] [place]  [address] Telephone:  [phone]      Follow up with [facility] on 03/25/2012. (walk-in Monday-Friday betweent the hours of  8AM and 5PM)    Contact information:   [address] [place], [place] [address]  Phone [phone]           Patient denies SI/HI:   Yes,      Safety Planning and Suicide Prevention discussed:  Yes,    Barrier to discharge identified:None  Summary and Recommendations: Pt. Should follow up with appointments. Pt. Was given SI pamphlet and crisis numbers and agreed to use them if needed. The pt. Was given a bus pass for transportation and the pt. Has a interview with the [facility] at 6;30 p.m. On 03/23/12. Pt. counselor spoke with Cecille Po- who spoke with the pt. At 10:30 p.m. on 03/22/12.about the interview today. Drew's contact numbers are [phone] and his cell number is [phone].    Lamar Blinks Jeneen 03/23/2012, 1:16 PM

## 2012-03-25 ENCOUNTER — Other Ambulatory Visit: Payer: Self-pay | Admitting: *Deleted

## 2012-03-25 ENCOUNTER — Other Ambulatory Visit (INDEPENDENT_AMBULATORY_CARE_PROVIDER_SITE_OTHER): Payer: Self-pay

## 2012-03-25 DIAGNOSIS — B20 Human immunodeficiency virus [HIV] disease: Secondary | ICD-10-CM

## 2012-03-25 DIAGNOSIS — Z113 Encounter for screening for infections with a predominantly sexual mode of transmission: Secondary | ICD-10-CM

## 2012-03-25 DIAGNOSIS — Z79899 Other long term (current) drug therapy: Secondary | ICD-10-CM

## 2012-03-26 LAB — LIPID PANEL
Cholesterol: 198 mg/dL (ref 0–200)
HDL: 45 mg/dL (ref >39)
Total CHOL/HDL Ratio: 4.4 Ratio
Triglycerides: 240 mg/dL — ABNORMAL HIGH (ref <150)

## 2012-03-26 LAB — T-HELPER CELL (CD4) - (RCID CLINIC ONLY): CD4 % Helper T Cell: 39 % (ref 33–55)

## 2012-03-26 LAB — RPR

## 2012-03-26 NOTE — Progress Notes (Signed)
Patient Discharge Instructions:  Psychiatric Admission Assessment Note Provided,  03/26/2012 After Visit Summary (AVS) Provided,  03/26/2012 Face Sheet Provided, 03/26/2012 Faxed/Sent to the Next Level Care provider:  03/26/2012 Provided Suicide Risk Assessment - Discharge Assessment 03/26/2012  Faxed to [facility] @ [phone] And to [facility] @ [phone]  Wandra Scot, 03/26/2012, 11:42 AM

## 2012-03-27 LAB — HIV-1 RNA QUANT-NO REFLEX-BLD: HIV 1 RNA Quant: <20 copies/mL (ref <20)

## 2012-03-29 ENCOUNTER — Encounter (HOSPITAL_COMMUNITY): Payer: Self-pay | Admitting: Emergency Medicine

## 2012-03-29 ENCOUNTER — Emergency Department (HOSPITAL_COMMUNITY)
Admission: EM | Admit: 2012-03-29 | Discharge: 2012-03-30 | Disposition: A | Discharge status: Other Acute Inpatient Hospital | Payer: Medicaid Other | Attending: Emergency Medicine | Admitting: Emergency Medicine

## 2012-03-29 DIAGNOSIS — F329 Major depressive disorder, single episode, unspecified: Secondary | ICD-10-CM | POA: Insufficient documentation

## 2012-03-29 DIAGNOSIS — I5022 Chronic systolic (congestive) heart failure: Secondary | ICD-10-CM | POA: Insufficient documentation

## 2012-03-29 DIAGNOSIS — Z7982 Long term (current) use of aspirin: Secondary | ICD-10-CM | POA: Insufficient documentation

## 2012-03-29 DIAGNOSIS — Z21 Asymptomatic human immunodeficiency virus [HIV] infection status: Secondary | ICD-10-CM | POA: Insufficient documentation

## 2012-03-29 DIAGNOSIS — R300 Dysuria: Secondary | ICD-10-CM | POA: Insufficient documentation

## 2012-03-29 DIAGNOSIS — N342 Other urethritis: Secondary | ICD-10-CM | POA: Insufficient documentation

## 2012-03-29 DIAGNOSIS — E785 Hyperlipidemia, unspecified: Secondary | ICD-10-CM | POA: Insufficient documentation

## 2012-03-29 DIAGNOSIS — R369 Urethral discharge, unspecified: Secondary | ICD-10-CM | POA: Insufficient documentation

## 2012-03-29 DIAGNOSIS — F3289 Other specified depressive episodes: Secondary | ICD-10-CM | POA: Insufficient documentation

## 2012-03-29 DIAGNOSIS — R45851 Suicidal ideations: Secondary | ICD-10-CM | POA: Insufficient documentation

## 2012-03-29 DIAGNOSIS — Z79899 Other long term (current) drug therapy: Secondary | ICD-10-CM | POA: Insufficient documentation

## 2012-03-29 LAB — COMPREHENSIVE METABOLIC PANEL
ALT: 35 U/L (ref 0–53)
AST: 28 U/L (ref 0–37)
Albumin: 4.1 g/dL (ref 3.5–5.2)
Alkaline Phosphatase: 57 U/L (ref 39–117)
BUN: 10 mg/dL (ref 6–23)
CO2: 27 mEq/L (ref 19–32)
Calcium: 9.2 mg/dL (ref 8.4–10.5)
Chloride: 101 mEq/L (ref 96–112)
Creatinine, Ser: 1.34 mg/dL (ref 0.50–1.35)
GFR calc Af Amer: 70 mL/min — ABNORMAL LOW (ref >90)
GFR calc non Af Amer: 60 mL/min — ABNORMAL LOW (ref >90)
Glucose, Bld: 75 mg/dL (ref 70–99)
Potassium: 3.9 mEq/L (ref 3.5–5.1)
Sodium: 139 mEq/L (ref 135–145)
Total Bilirubin: 1.2 mg/dL (ref 0.3–1.2)
Total Protein: 7.3 g/dL (ref 6.0–8.3)

## 2012-03-29 LAB — RAPID URINE DRUG SCREEN, HOSP PERFORMED
Amphetamines: NOT DETECTED
Barbiturates: NOT DETECTED
Benzodiazepines: POSITIVE — AB
Cocaine: NOT DETECTED
Opiates: NOT DETECTED
Tetrahydrocannabinol: POSITIVE — AB

## 2012-03-29 LAB — CBC
HCT: 49.6 % (ref 39.0–52.0)
Hemoglobin: 18.2 g/dL — ABNORMAL HIGH (ref 13.0–17.0)
MCH: 33.7 pg (ref 26.0–34.0)
MCHC: 36.7 g/dL — ABNORMAL HIGH (ref 30.0–36.0)
MCV: 91.9 fL (ref 78.0–100.0)
Platelets: 194 10*3/uL (ref 150–400)
RBC: 5.4 MIL/uL (ref 4.22–5.81)
RDW: 13.6 % (ref 11.5–15.5)
WBC: 4.2 10*3/uL (ref 4.0–10.5)

## 2012-03-29 LAB — ETHANOL: Alcohol, Ethyl (B): <11 mg/dL (ref 0–11)

## 2012-03-29 LAB — ACETAMINOPHEN LEVEL: Acetaminophen (Tylenol), Serum: <15 ug/mL (ref 10–30)

## 2012-03-29 MED ORDER — NICOTINE 21 MG/24HR TD PT24: 21.0000 mg | MEDICATED_PATCH | Freq: Every day | TRANSDERMAL | Status: DC

## 2012-03-29 MED ORDER — LORAZEPAM 1 MG PO TABS: 1.0000 mg | ORAL_TABLET | Freq: Three times a day (TID) | ORAL | Status: DC | PRN

## 2012-03-29 MED ORDER — CEFTRIAXONE SODIUM 250 MG IJ SOLR
250.0000 mg | Freq: Once | INTRAMUSCULAR | Status: AC
  Administered 2012-03-29: 250 mg via INTRAMUSCULAR
  Filled 2012-03-29: qty 250

## 2012-03-29 MED ORDER — ONDANSETRON HCL 4 MG PO TABS: 4.0000 mg | ORAL_TABLET | Freq: Three times a day (TID) | ORAL | Status: DC | PRN

## 2012-03-29 MED ORDER — ACETAMINOPHEN 325 MG PO TABS: 650.0000 mg | ORAL_TABLET | ORAL | Status: DC | PRN

## 2012-03-29 MED ORDER — IBUPROFEN 600 MG PO TABS: 600.0000 mg | ORAL_TABLET | Freq: Three times a day (TID) | ORAL | Status: DC | PRN

## 2012-03-29 MED ORDER — AZITHROMYCIN 250 MG PO TABS
1000.0000 mg | ORAL_TABLET | Freq: Once | ORAL | Status: AC
  Administered 2012-03-29: 1000 mg via ORAL
  Filled 2012-03-29: qty 4

## 2012-03-29 MED ORDER — ALUM & MAG HYDROXIDE-SIMETH 200-200-20 MG/5ML PO SUSP: 30.0000 mL | ORAL | Status: DC | PRN

## 2012-03-29 NOTE — ED Notes (Signed)
Pt presented to the ER stating that he has "again" some suicidal ideations, states he is thinking jumping in front of the bus, pt further explains that these SI ideas started yesterday, pt on the medications for depression, also reports that he has hx of HIV and other STDs. Pt said that he was at the [facility] and that he left to early and that he needs help. Pt reports using crack on Wednesday and some MJ today and late last night sates he has been drinking.

## 2012-03-29 NOTE — ED Provider Notes (Signed)
History     CSN: [id number]  Arrival date & time 03/29/12  2032   First MD Initiated Contact with Patient 03/29/12 2139      Chief Complaint  Patient presents with  . Suicidal  . SEXUALLY TRANSMITTED DISEASE    (Consider location/radiation/quality/duration/timing/severity/associated sxs/prior treatment) HPI Comments: Patient presents with complaint of worsening depression and suicidal ideation. He was recently in behavioral health and discharged feeling better. He states he is now once again thinking of jumping in front of a bus. Patient admits to drinking alcohol, doing crack, and using marijuana. Patient does not have any medical complaints other than penile discharge. Patient admits to unprotected sex several days ago and since yesterday has had a clear discharge.  Patient is a 51 y.o. male presenting with mental health disorder. The history is provided by the patient.  Mental Health Problem  Additional symptoms of the illness do not include no headaches or no abdominal pain. He admits to suicidal ideas. He does have a plan to commit suicide. He contemplates harming himself. He has not already injured self. He does not contemplate injuring another person. Risk factors that are present for mental illness include a history of mental illness and substance abuse.    Past Medical History  Diagnosis Date  . HIV (human immunodeficiency virus infection)   . Chronic systolic heart failure   . Hypertension   . Genital herpes   . Active smoker   . AR (allergic rhinitis)   . HLD (hyperlipidemia)   . NICM (nonischemic cardiomyopathy)     Echocardiogram 06/28/11: EF 30-35%, mild MR, mild LAE;  No CAD by coronary CT angiogram 3/12 at [facility]  . NSVT (nonsustained ventricular tachycardia)   . Depression   . Bipolar 1 disorder   . Alcohol abuse   . Crack cocaine use     History reviewed. No pertinent past surgical history.  History reviewed. No pertinent family  history.  History  Substance Use Topics  . Smoking status: Current Everyday Smoker -- 0.8 packs/day for 30 years    Types: Cigarettes  . Smokeless tobacco: Never Used  . Alcohol Use: Yes     4 to 5 beers daily and 2 5ths of wine      Review of Systems  Constitutional: Negative for fever.  HENT: Negative for sore throat and rhinorrhea.   Eyes: Negative for redness.  Respiratory: Negative for cough.   Cardiovascular: Negative for chest pain.  Gastrointestinal: Negative for nausea, vomiting, abdominal pain and diarrhea.  Genitourinary: Positive for dysuria and discharge. Negative for hematuria.  Musculoskeletal: Negative for myalgias.  Skin: Negative for rash.  Neurological: Negative for headaches.  Psychiatric/Behavioral: Positive for suicidal ideas.    Allergies  Review of patient's allergies indicates no known allergies.  Home Medications   Current Outpatient Rx  Name Route Sig Dispense Refill  . ALBUTEROL SULFATE HFA 108 (90 BASE) MCG/ACT IN AERS Inhalation Inhale 2 puffs into the lungs as needed. Per pt med is as needed with no frequency for asthma. 1 Inhaler 0  . ASPIRIN 81 MG PO TABS Oral Take 1 tablet (81 mg total) by mouth daily. For decreased platelet aggregation. 30 tablet 0  . BUPROPION HCL ER (XL) 150 MG PO TB24 Oral Take 1 tablet (150 mg total) by mouth daily. For depression. 30 tablet 0  . CARVEDILOL 6.25 MG PO TABS Oral Take 1 tablet (6.25 mg total) by mouth 2 (two) times daily with a meal. For hypertension.    Marland Kitchen  CETIRIZINE HCL 10 MG PO TABS Oral Take 1 tablet (10 mg total) by mouth daily. For seasonal allergies.    Marland Kitchen CLOTRIMAZOLE 1 % EX CREA  Apply daily for fungal infection. 30 g   . DIGOXIN 0.125 MG PO TABS Oral Take 1 tablet (125 mcg total) by mouth daily. For control of heart arrythmia.    Marland Kitchen DOCUSATE SODIUM 100 MG PO CAPS Oral Take 1 capsule (100 mg total) by mouth 2 (two) times daily. For constipation. 10 capsule   . EMTRICITABINE-TENOFOVIR 200-300 MG PO  TABS Oral Take 1 tablet by mouth daily. For HIV    . FAMOTIDINE 20 MG PO TABS Oral Take 1 tablet (20 mg total) by mouth 2 (two) times daily. For GERD.    Marland Kitchen FUROSEMIDE 40 MG PO TABS Oral Take 1 tablet (40 mg total) by mouth daily. For edema 30 tablet 0  . GUAIFENESIN ER 600 MG PO TB12 Oral Take 2 tablets (1,200 mg total) by mouth 2 (two) times daily. For cough    . LISINOPRIL 5 MG PO TABS Oral Take 1 tablet (5 mg total) by mouth daily. For hypertension    . ONE-DAILY MULTI VITAMINS PO TABS Oral Take 1 tablet by mouth daily. For vitamin deficiency    . NALTREXONE HCL 50 MG PO TABS Oral Take 1 tablet (50 mg total) by mouth daily. For withdrawal.    . NICOTINE 21 MG/24HR TD PT24 Transdermal Place 1 patch onto the skin daily at 6 (six) AM. For nicotine withdrawal 28 patch 0  . POLYETHYLENE GLYCOL 3350 PO PACK Oral Take 17 g by mouth daily. For constipation. 14 each   . POTASSIUM CHLORIDE CRYS ER 20 MEQ PO TBCR Oral Take 1 tablet (20 mEq total) by mouth daily. For low potassium.    Marland Kitchen RALTEGRAVIR POTASSIUM 400 MG PO TABS Oral Take 1 tablet (400 mg total) by mouth 2 (two) times daily. For HIV    . RANITIDINE HCL 150 MG PO TABS Oral Take 1 tablet (150 mg total) by mouth daily. For acid reflux    . TRAZODONE HCL 150 MG PO TABS Oral Take 1 tablet (150 mg total) by mouth at bedtime. For sleep.    Marland Kitchen VALACYCLOVIR HCL 500 MG PO TABS Oral Take 1 tablet (500 mg total) by mouth 2 (two) times daily. For Herpes.      BP 112/74  Pulse 100  Temp(Src) 98 F (36.7 C) (Oral)  Resp 18  Wt 199 lb 6.4 oz (90.447 kg)  SpO2 96%  Physical Exam  Nursing note and vitals reviewed. Constitutional: He is oriented to person, place, and time. He appears well-developed and well-nourished.  HENT:  Head: Normocephalic and atraumatic.  Eyes: Conjunctivae are normal. Right eye exhibits no discharge. Left eye exhibits no discharge.  Neck: Normal range of motion. Neck supple.  Cardiovascular: Normal rate, regular rhythm and  normal heart sounds.   Pulmonary/Chest: Effort normal and breath sounds normal.  Abdominal: Soft. There is no tenderness.  Genitourinary: Testes normal.    Right testis shows no tenderness. Left testis shows no tenderness. Penile erythema present. No penile tenderness. No discharge found.  Neurological: He is alert and oriented to person, place, and time.  Skin: Skin is warm and dry.  Psychiatric: He has a normal mood and affect.    ED Course  Procedures (including critical care time)  Labs Reviewed  CBC - Abnormal; Notable for the following:    Hemoglobin 18.2 (*)    MCHC 36.7 (*)  All other components within normal limits  COMPREHENSIVE METABOLIC PANEL - Abnormal; Notable for the following:    GFR calc non Af Amer 60 (*)    GFR calc Af Amer 70 (*)    All other components within normal limits  URINE RAPID DRUG SCREEN (HOSP PERFORMED) - Abnormal; Notable for the following:    Benzodiazepines POSITIVE (*)    Tetrahydrocannabinol POSITIVE (*)    All other components within normal limits  ETHANOL  ACETAMINOPHEN LEVEL  GC/CHLAMYDIA PROBE AMP, GENITAL   No results found.   1. Suicidal ideation   2. Urethritis     10:42 PM Patient seen and examined. Work-up initiated. Medications ordered.   Vital signs reviewed and are as follows: Filed Vitals:   03/29/12 2110  BP: 112/74  Pulse: 100  Temp: 98 F (36.7 C)  Resp: 18   Patient treated for GC/chlamydia. Anticipate ACT consult.   Nurse has spoken with ACT. ACT consult is pending.   Handoff to Dr. Hyacinth Meeker at 0100.    MDM  Suicidal ideation. Also likely urethritis, treated.         Renne Crigler, [place] 03/30/12 [phone]

## 2012-03-30 ENCOUNTER — Inpatient Hospital Stay (HOSPITAL_COMMUNITY)
Admission: RE | Admit: 2012-03-30 | Discharge: 2012-04-05 | DRG: 897 | Disposition: A | Discharge status: Home / Self Care | Payer: PRIVATE HEALTH INSURANCE | Source: Ambulatory Visit | Attending: Psychiatry | Admitting: Psychiatry

## 2012-03-30 DIAGNOSIS — A6000 Herpesviral infection of urogenital system, unspecified: Secondary | ICD-10-CM | POA: Diagnosis present

## 2012-03-30 DIAGNOSIS — J189 Pneumonia, unspecified organism: Secondary | ICD-10-CM

## 2012-03-30 DIAGNOSIS — I428 Other cardiomyopathies: Secondary | ICD-10-CM

## 2012-03-30 DIAGNOSIS — F3130 Bipolar disorder, current episode depressed, mild or moderate severity, unspecified: Secondary | ICD-10-CM | POA: Diagnosis present

## 2012-03-30 DIAGNOSIS — B20 Human immunodeficiency virus [HIV] disease: Secondary | ICD-10-CM

## 2012-03-30 DIAGNOSIS — I10 Essential (primary) hypertension: Secondary | ICD-10-CM | POA: Diagnosis present

## 2012-03-30 DIAGNOSIS — E785 Hyperlipidemia, unspecified: Secondary | ICD-10-CM

## 2012-03-30 DIAGNOSIS — Z21 Asymptomatic human immunodeficiency virus [HIV] infection status: Secondary | ICD-10-CM | POA: Diagnosis present

## 2012-03-30 DIAGNOSIS — F17200 Nicotine dependence, unspecified, uncomplicated: Secondary | ICD-10-CM | POA: Diagnosis present

## 2012-03-30 DIAGNOSIS — R110 Nausea: Secondary | ICD-10-CM

## 2012-03-30 DIAGNOSIS — F1020 Alcohol dependence, uncomplicated: Secondary | ICD-10-CM

## 2012-03-30 DIAGNOSIS — B009 Herpesviral infection, unspecified: Secondary | ICD-10-CM

## 2012-03-30 DIAGNOSIS — R197 Diarrhea, unspecified: Secondary | ICD-10-CM

## 2012-03-30 DIAGNOSIS — I5022 Chronic systolic (congestive) heart failure: Secondary | ICD-10-CM

## 2012-03-30 DIAGNOSIS — F10988 Alcohol use, unspecified with other alcohol-induced disorder: Principal | ICD-10-CM | POA: Diagnosis present

## 2012-03-30 DIAGNOSIS — F1410 Cocaine abuse, uncomplicated: Secondary | ICD-10-CM

## 2012-03-30 DIAGNOSIS — J309 Allergic rhinitis, unspecified: Secondary | ICD-10-CM

## 2012-03-30 MED ORDER — FAMOTIDINE 40 MG PO TABS
40.0000 mg | ORAL_TABLET | Freq: Two times a day (BID) | ORAL | Status: DC
  Administered 2012-03-31 – 2012-04-05 (×11): 40 mg via ORAL
  Filled 2012-03-30: qty 42
  Filled 2012-03-30 (×4): qty 1
  Filled 2012-03-30: qty 42
  Filled 2012-03-30 (×3): qty 1
  Filled 2012-03-30: qty 42
  Filled 2012-03-30 (×4): qty 1
  Filled 2012-03-30: qty 42

## 2012-03-30 MED ORDER — POTASSIUM CHLORIDE CRYS ER 20 MEQ PO TBCR
20.0000 meq | EXTENDED_RELEASE_TABLET | Freq: Every day | ORAL | Status: DC
  Administered 2012-03-30: 20 meq via ORAL
  Filled 2012-03-30: qty 1

## 2012-03-30 MED ORDER — VALACYCLOVIR HCL 500 MG PO TABS
500.0000 mg | ORAL_TABLET | Freq: Two times a day (BID) | ORAL | Status: DC
  Administered 2012-03-31 – 2012-04-05 (×11): 500 mg via ORAL
  Filled 2012-03-30 (×2): qty 1
  Filled 2012-03-30 (×2): qty 28
  Filled 2012-03-30 (×7): qty 1
  Filled 2012-03-30 (×2): qty 28
  Filled 2012-03-30 (×4): qty 1

## 2012-03-30 MED ORDER — CARVEDILOL 6.25 MG PO TABS
6.2500 mg | ORAL_TABLET | Freq: Two times a day (BID) | ORAL | Status: DC
  Administered 2012-03-30: 6.25 mg via ORAL
  Filled 2012-03-30 (×4): qty 1

## 2012-03-30 MED ORDER — NALTREXONE HCL 50 MG PO TABS
50.0000 mg | ORAL_TABLET | Freq: Every day | ORAL | Status: DC
  Administered 2012-03-30: 50 mg via ORAL
  Filled 2012-03-30 (×2): qty 1

## 2012-03-30 MED ORDER — GUAIFENESIN ER 600 MG PO TB12
1200.0000 mg | ORAL_TABLET | Freq: Two times a day (BID) | ORAL | Status: DC | PRN
  Filled 2012-03-30: qty 56

## 2012-03-30 MED ORDER — EMTRICITABINE-TENOFOVIR DF 200-300 MG PO TABS
1.0000 | ORAL_TABLET | Freq: Every day | ORAL | Status: DC
  Administered 2012-03-30: 1 via ORAL
  Filled 2012-03-30 (×2): qty 1

## 2012-03-30 MED ORDER — TRAZODONE HCL 50 MG PO TABS: 150.0000 mg | ORAL_TABLET | Freq: Every day | ORAL | Status: DC

## 2012-03-30 MED ORDER — DOCUSATE SODIUM 100 MG PO CAPS
100.0000 mg | ORAL_CAPSULE | Freq: Two times a day (BID) | ORAL | Status: DC
  Administered 2012-03-31 – 2012-04-05 (×11): 100 mg via ORAL
  Filled 2012-03-30 (×4): qty 1
  Filled 2012-03-30: qty 28
  Filled 2012-03-30 (×4): qty 1
  Filled 2012-03-30: qty 28
  Filled 2012-03-30: qty 1
  Filled 2012-03-30: qty 28
  Filled 2012-03-30: qty 1
  Filled 2012-03-30: qty 28
  Filled 2012-03-30: qty 1

## 2012-03-30 MED ORDER — CHLORDIAZEPOXIDE HCL 25 MG PO CAPS
25.0000 mg | ORAL_CAPSULE | Freq: Four times a day (QID) | ORAL | Status: DC | PRN
  Administered 2012-03-30 – 2012-03-31 (×2): 25 mg via ORAL
  Filled 2012-03-30 (×2): qty 1

## 2012-03-30 MED ORDER — BUPROPION HCL ER (XL) 150 MG PO TB24
150.0000 mg | ORAL_TABLET | Freq: Every day | ORAL | Status: DC
  Administered 2012-03-31 – 2012-04-05 (×6): 150 mg via ORAL
  Filled 2012-03-30: qty 1
  Filled 2012-03-30: qty 14
  Filled 2012-03-30 (×6): qty 1
  Filled 2012-03-30: qty 14

## 2012-03-30 MED ORDER — EMTRICITABINE-TENOFOVIR DF 200-300 MG PO TABS
1.0000 | ORAL_TABLET | Freq: Every day | ORAL | Status: DC
  Administered 2012-03-31 – 2012-04-05 (×6): 1 via ORAL
  Filled 2012-03-30 (×7): qty 1

## 2012-03-30 MED ORDER — LISINOPRIL 5 MG PO TABS
5.0000 mg | ORAL_TABLET | Freq: Every day | ORAL | Status: DC
  Administered 2012-03-30: 5 mg via ORAL
  Filled 2012-03-30 (×2): qty 1

## 2012-03-30 MED ORDER — ACETAMINOPHEN 325 MG PO TABS
650.0000 mg | ORAL_TABLET | Freq: Four times a day (QID) | ORAL | Status: DC | PRN
  Administered 2012-03-31 – 2012-04-05 (×10): 650 mg via ORAL

## 2012-03-30 MED ORDER — DIGOXIN 125 MCG PO TABS
0.1250 mg | ORAL_TABLET | Freq: Every day | ORAL | Status: DC
  Administered 2012-03-31 – 2012-04-05 (×6): 0.125 mg via ORAL
  Filled 2012-03-30 (×3): qty 1
  Filled 2012-03-30: qty 14
  Filled 2012-03-30 (×3): qty 1
  Filled 2012-03-30: qty 14

## 2012-03-30 MED ORDER — ALUM & MAG HYDROXIDE-SIMETH 200-200-20 MG/5ML PO SUSP: 30.0000 mL | ORAL | Status: DC | PRN

## 2012-03-30 MED ORDER — RALTEGRAVIR POTASSIUM 400 MG PO TABS
400.0000 mg | ORAL_TABLET | Freq: Two times a day (BID) | ORAL | Status: DC
  Administered 2012-03-31 – 2012-04-05 (×11): 400 mg via ORAL
  Filled 2012-03-30 (×13): qty 1

## 2012-03-30 MED ORDER — DOCUSATE SODIUM 100 MG PO CAPS
100.0000 mg | ORAL_CAPSULE | Freq: Two times a day (BID) | ORAL | Status: DC
  Filled 2012-03-30 (×4): qty 1

## 2012-03-30 MED ORDER — CARVEDILOL 3.125 MG PO TABS
6.2500 mg | ORAL_TABLET | Freq: Two times a day (BID) | ORAL | Status: DC
  Administered 2012-03-30 – 2012-04-05 (×12): 6.25 mg via ORAL
  Filled 2012-03-30 (×3): qty 2
  Filled 2012-03-30: qty 28
  Filled 2012-03-30: qty 1
  Filled 2012-03-30: qty 28
  Filled 2012-03-30: qty 1
  Filled 2012-03-30 (×2): qty 2
  Filled 2012-03-30: qty 1
  Filled 2012-03-30: qty 2
  Filled 2012-03-30: qty 28
  Filled 2012-03-30: qty 2
  Filled 2012-03-30: qty 28
  Filled 2012-03-30 (×4): qty 2

## 2012-03-30 MED ORDER — ALBUTEROL SULFATE HFA 108 (90 BASE) MCG/ACT IN AERS
2.0000 | INHALATION_SPRAY | RESPIRATORY_TRACT | Status: DC | PRN
Start: 2012-03-30 — End: 2012-04-05

## 2012-03-30 MED ORDER — RALTEGRAVIR POTASSIUM 400 MG PO TABS
400.0000 mg | ORAL_TABLET | Freq: Two times a day (BID) | ORAL | Status: DC
  Administered 2012-03-30: 400 mg via ORAL
  Filled 2012-03-30 (×4): qty 1

## 2012-03-30 MED ORDER — ALBUTEROL SULFATE HFA 108 (90 BASE) MCG/ACT IN AERS: 2.0000 | INHALATION_SPRAY | RESPIRATORY_TRACT | Status: DC | PRN

## 2012-03-30 MED ORDER — POTASSIUM CHLORIDE CRYS ER 20 MEQ PO TBCR
20.0000 meq | EXTENDED_RELEASE_TABLET | Freq: Every day | ORAL | Status: DC
  Administered 2012-03-31 – 2012-04-05 (×6): 20 meq via ORAL
  Filled 2012-03-30: qty 1
  Filled 2012-03-30: qty 14
  Filled 2012-03-30 (×4): qty 1
  Filled 2012-03-30: qty 14
  Filled 2012-03-30: qty 1

## 2012-03-30 MED ORDER — NICOTINE 21 MG/24HR TD PT24
21.0000 mg | MEDICATED_PATCH | Freq: Every day | TRANSDERMAL | Status: DC
  Administered 2012-03-31 – 2012-04-04 (×5): 21 mg via TRANSDERMAL
  Filled 2012-03-30 (×4): qty 1
  Filled 2012-03-30: qty 14
  Filled 2012-03-30: qty 1
  Filled 2012-03-30: qty 14
  Filled 2012-03-30 (×3): qty 1

## 2012-03-30 MED ORDER — TRAZODONE HCL 50 MG PO TABS
150.0000 mg | ORAL_TABLET | Freq: Every evening | ORAL | Status: DC | PRN
  Administered 2012-03-30: 150 mg via ORAL
  Filled 2012-03-30: qty 1

## 2012-03-30 MED ORDER — POLYETHYLENE GLYCOL 3350 17 G PO PACK
17.0000 g | PACK | Freq: Every day | ORAL | Status: DC
  Filled 2012-03-30 (×2): qty 1

## 2012-03-30 MED ORDER — FUROSEMIDE 40 MG PO TABS
40.0000 mg | ORAL_TABLET | Freq: Every day | ORAL | Status: DC
  Administered 2012-03-31 – 2012-04-05 (×6): 40 mg via ORAL
  Filled 2012-03-30 (×3): qty 1
  Filled 2012-03-30: qty 14
  Filled 2012-03-30 (×3): qty 1
  Filled 2012-03-30: qty 14

## 2012-03-30 MED ORDER — LORATADINE 10 MG PO TABS
10.0000 mg | ORAL_TABLET | Freq: Every day | ORAL | Status: DC
  Filled 2012-03-30 (×2): qty 1

## 2012-03-30 MED ORDER — ADULT MULTIVITAMIN W/MINERALS CH
1.0000 | ORAL_TABLET | Freq: Every day | ORAL | Status: DC
  Administered 2012-03-31 – 2012-04-05 (×6): 1 via ORAL
  Filled 2012-03-30 (×7): qty 1

## 2012-03-30 MED ORDER — RANITIDINE HCL 150 MG/10ML PO SYRP
150.0000 mg | ORAL_SOLUTION | Freq: Every day | ORAL | Status: DC
  Filled 2012-03-30: qty 10

## 2012-03-30 MED ORDER — ADULT MULTIVITAMIN W/MINERALS CH
1.0000 | ORAL_TABLET | Freq: Every day | ORAL | Status: DC
  Administered 2012-03-30: 1 via ORAL
  Filled 2012-03-30: qty 1

## 2012-03-30 MED ORDER — ASPIRIN 81 MG PO CHEW
81.0000 mg | CHEWABLE_TABLET | Freq: Every day | ORAL | Status: DC
  Administered 2012-03-30: 81 mg via ORAL
  Filled 2012-03-30: qty 1

## 2012-03-30 MED ORDER — VALACYCLOVIR HCL 500 MG PO TABS
500.0000 mg | ORAL_TABLET | Freq: Two times a day (BID) | ORAL | Status: DC
  Administered 2012-03-30: 500 mg via ORAL
  Filled 2012-03-30 (×4): qty 1

## 2012-03-30 MED ORDER — DIGOXIN 125 MCG PO TABS
125.0000 ug | ORAL_TABLET | Freq: Every day | ORAL | Status: DC
  Administered 2012-03-30: 125 ug via ORAL
  Filled 2012-03-30 (×2): qty 1

## 2012-03-30 MED ORDER — FAMOTIDINE 20 MG PO TABS
20.0000 mg | ORAL_TABLET | Freq: Two times a day (BID) | ORAL | Status: DC
  Administered 2012-03-30 (×2): 20 mg via ORAL
  Filled 2012-03-30: qty 1

## 2012-03-30 MED ORDER — FUROSEMIDE 40 MG PO TABS
40.0000 mg | ORAL_TABLET | Freq: Every day | ORAL | Status: DC
  Administered 2012-03-30: 40 mg via ORAL
  Filled 2012-03-30 (×2): qty 1

## 2012-03-30 MED ORDER — LISINOPRIL 5 MG PO TABS
5.0000 mg | ORAL_TABLET | Freq: Every day | ORAL | Status: DC
  Administered 2012-03-31 – 2012-04-05 (×6): 5 mg via ORAL
  Filled 2012-03-30 (×4): qty 1
  Filled 2012-03-30 (×2): qty 14
  Filled 2012-03-30 (×2): qty 1

## 2012-03-30 MED ORDER — MAGNESIUM HYDROXIDE 400 MG/5ML PO SUSP: 30.0000 mL | Freq: Every day | ORAL | Status: DC | PRN

## 2012-03-30 MED ORDER — BUPROPION HCL ER (XL) 150 MG PO TB24
150.0000 mg | ORAL_TABLET | Freq: Every day | ORAL | Status: DC
  Administered 2012-03-30: 150 mg via ORAL
  Filled 2012-03-30 (×2): qty 1

## 2012-03-30 MED ORDER — LORATADINE 10 MG PO TABS
10.0000 mg | ORAL_TABLET | Freq: Every day | ORAL | Status: DC
  Administered 2012-03-31 – 2012-04-05 (×6): 10 mg via ORAL
  Filled 2012-03-30 (×7): qty 1
  Filled 2012-03-30 (×2): qty 14

## 2012-03-30 MED ORDER — FAMOTIDINE 20 MG PO TABS
20.0000 mg | ORAL_TABLET | Freq: Every day | ORAL | Status: DC
  Filled 2012-03-30: qty 1

## 2012-03-30 NOTE — Tx Team (Signed)
Initial Interdisciplinary Treatment Plan  PATIENT STRENGTHS: (choose at least two) Ability for insight Communication skills General fund of knowledge  PATIENT STRESSORS: Health problems Marital or family conflict Substance abuse   PROBLEM LIST: Problem List/Patient Goals Date to be addressed Date deferred Reason deferred Estimated date of resolution  SA recovery 03/30/2012     Chronic care management(HIV) 03/30/2012     Housing issue resolution 03/30/2012                                          DISCHARGE CRITERIA:  Improved stabilization in mood, thinking, and/or behavior Medical problems require only outpatient monitoring Motivation to continue treatment in a less acute level of care Safe-care adequate arrangements made  PRELIMINARY DISCHARGE PLAN: Attend 12-step recovery group Placement in alternative living arrangements  PATIENT/FAMIILY INVOLVEMENT: This treatment plan has been presented to and reviewed with the patient, Jguadalupe Opiela, and/or family member,   The patient and family have been given the opportunity to ask questions and make suggestions.  Cresenciano Lick 03/30/2012, 6:42 PM

## 2012-03-30 NOTE — ED Notes (Signed)
Per Carlisle Beers, RN at [facility] we can send pt in 20 minutes. Luann and I talked and discussed report when she called at about 1500.
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
## 2012-03-30 NOTE — BH Assessment (Signed)
Pt is a 51 y/o b/s/m admitted following medical clearance from [facility]. Jennie was discharged last week from this facility with a plan to enter [facility]. He subsequently went to the home of a male and relapsed on etoh and crack cocaine.  Conflicts ensues and the pt left the home this past Thursday and became suicidal with the intent to "walk out in front of traffic".  He presents quiet with intense feelings of guilt and shame stating that he "has burned all his bridges" and has no current d/c plans. Pt is currently homeless.  Presently contracts for safety.  Was recently dx HIV positive and all home medications were secured in locker #111 with additional personal belongings.  15' checks initiated for safety. Dr. Theotis Barrio contacted x2 for medication orders with no return call at this time. POC initiated.

## 2012-03-30 NOTE — ED Notes (Signed)
Pt has been accepted to [facility] by Dr Fredda Hammed to [facility] room #301-1; tried to call report, Maryjo Rochester RN will call back to get report.

## 2012-03-30 NOTE — ED Provider Notes (Signed)
Medical screening examination/treatment/procedure(s) were performed by non-physician practitioner and as supervising physician I was immediately available for consultation/collaboration.  Raeford Razor, MD 03/30/12 0040

## 2012-03-30 NOTE — BHH Counselor (Signed)
Pt has been accepted to [facility] by Dr. Theotis Barrio to Dr. Manus Gunning to room 301-1.  Pt is voluntary and can be transported by security.  Dr. Freida Busman is aware of dispo and in agreement.
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
## 2012-03-30 NOTE — Progress Notes (Signed)
Pt positive for group, interacting appropriately on unit.  Pt new admission after discharge last Saturday.  Pt returned to live with girlfriend, started drinking and using crack again at discharge.  Denies HI/hallucinations, passive SI but contracts.  Support and encouragement offered, will continue to monitor.

## 2012-03-30 NOTE — ED Notes (Signed)
Per Judeth Cornfield ACT reports that she will run pt information at [facility] to defer tele psych consult until we hear back from [facility]

## 2012-03-31 DIAGNOSIS — F1020 Alcohol dependence, uncomplicated: Secondary | ICD-10-CM

## 2012-03-31 DIAGNOSIS — F1410 Cocaine abuse, uncomplicated: Secondary | ICD-10-CM

## 2012-03-31 MED ORDER — CHLORDIAZEPOXIDE HCL 25 MG PO CAPS: 25.0000 mg | ORAL_CAPSULE | ORAL | Status: DC | PRN

## 2012-03-31 MED ORDER — TRAZODONE HCL 100 MG PO TABS
200.0000 mg | ORAL_TABLET | Freq: Every day | ORAL | Status: DC
  Administered 2012-03-31 – 2012-04-04 (×5): 200 mg via ORAL
  Filled 2012-03-31 (×6): qty 2

## 2012-03-31 MED ORDER — HYDROXYZINE HCL 50 MG PO TABS
50.0000 mg | ORAL_TABLET | Freq: Every evening | ORAL | Status: DC | PRN
  Administered 2012-04-04: 50 mg via ORAL

## 2012-03-31 NOTE — Progress Notes (Signed)
Adult Psychosocial Assessment Update Interdisciplinary Team  Previous [facility] admissions/discharges:  Admissions Discharges  Date: 03-19-12 Date: 03-23-12  Date: Date:  Date: Date:  Date: Date:  Date: Date:   Changes since the last Psychosocial Assessment (including adherence to outpatient mental health and/or substance abuse treatment, situational issues contributing to decompensation and/or relapse). Ryan Long reports he left at noon for an interview at an [employer] at 6 and had "too much time" and went to a old girlfriends home and started using. Ryan Long would like to go to a [facility] and "try again" but this time would like to either have a ride or go closer to the interview time.             Discharge Plan 1. Will you be returning to the same living situation after discharge?   Yes: No: X     If no, what is your plan?    Ryan Long was given [employer], Ryan Long has no money up fount but will by the end of the month       2. Would you like a referral for services when you are discharged? Yes:     If yes, for what services?  No:   X    Ryan Long.made apts with his heart Dr. And with the health dept on 04/08/12 and with [facility] with Dr. Collene Leyden in May.       Summary and Recommendations (to be completed by the evaluator) Ryan Long spoke about lots of guilt and shame around coming back to [facility] and wanting to start again. He stated that he is still having S/I that is "off and on" Ryan Long stated he would not try to kill himself in the hospital and agreed to talk to staff if feeling S/I.  Ryan Long stated he has information in his locker that he needs to get faxed to his PO about a missed court date in March.  Recommendations include crisis stabilization, case management, medication management, psych education groups to teach coping skills and group therapy.                         Signature:  Gevena Mart, 03/31/2012 3:29 PM

## 2012-03-31 NOTE — Progress Notes (Signed)
Patient ID: Ryan Long, male   DOB: 21-Aug-1961, 51 y.o.   MRN: [id number]  [facility] Group Notes:  (Counselor/Nursing/MHT/Case Management/Adjunct)  03/31/2012 1:15 PM  Type of Therapy:  Group Therapy, Dance/Movement Therapy   Participation Level:  Minimal  Participation Quality:  Attentive and Drowsy  Affect:  Depressed  Cognitive:  Appropriate  Insight:  Limited  Engagement in Group:  Limited  Engagement in Therapy:  Limited  Modes of Intervention:  Clarification, Problem-solving, Role-play, Socialization and Support  Summary of Progress/Problems:  Therapist discussed with group what does the term recovery mean to them.  Therapist posed Ryan Long are healthy coping mechanisms needed for positive growth in the recovery process". Pt. stated that" recovery is an endless process" Pt. stated that "staying away from negative friends and relationship" is  needed for me to grow in the right direction on my path to recovery.  Pt. seemed to have limited insight on the term "recovery".       Rhunette Croft

## 2012-03-31 NOTE — BHH Suicide Risk Assessment (Signed)
Suicide Risk Assessment  Admission Assessment     Demographic factors:   63 male Current Mental Status:     Appearance: disheveled  Eye Contact:: poor  Speech: clear  Volume: normal  Mood: depressed  Affect: iritible  Thought Process: linear  Orientation: Full x 3  Thought Content: normal  Suicidal Thoughts: +SI, no plans now Homicidal Thoughts: no  Memory: poor  Judgement: poor  Insight: lacking  Psychomotor Activity: normal  Concentration: poor  Recall: poor  Akathisia: no   Loss Factors:   homeless, limited family support, unemployes Historical Factors:   no attempts Risk Reduction Factors:   willing to get better,   CLINICAL FACTORS:   Alcohol/Substance Abuse/Dependencies  COGNITIVE FEATURES THAT CONTRIBUTE TO RISK:  Closed-mindedness    SUICIDE RISK:   Moderate:  Frequent suicidal ideation with limited intensity, and duration, some specificity in terms of plans, no associated intent, good self-control, limited dysphoria/symptomatology, some risk factors present, and identifiable protective factors, including available and accessible social support.  PLAN OF CARE:   Assessment:   AXIS I: Alcohol Abuse; Cocaine Abuse  AXIS II: deferred  AXIS III: HIV Infection  Past Medical History   Diagnosis  Date   .  HIV (human immunodeficiency virus infection)    .  Chronic systolic heart failure    .  Hypertension    .  Genital herpes    .  Active smoker    .  AR (allergic rhinitis)    .  HLD (hyperlipidemia)    .  NICM (nonischemic cardiomyopathy)      Echocardiogram 06/28/11: EF 30-35%, mild MR, mild LAE; No CAD by coronary CT angiogram 3/12 at [facility]   .  NSVT (nonsustained ventricular tachycardia)    .  Depression    .  Bipolar 1 disorder    .  Alcohol abuse    .  Crack cocaine use     AXIS IV: homeless  AXIS V: 30   Treatment Plan Summary:   Resume previous medications. Librium 25mg  q 6 prn withdrawal.    Wonda Cerise 03/31/2012,  4:52 PM

## 2012-03-31 NOTE — H&P (Signed)
  Pt was seen by me today and I agree with the key elements documented in H&P.  

## 2012-03-31 NOTE — Progress Notes (Signed)
Pt quiet and isolative this shift. Rates depression and hopelessness at 8. "Off and on" SI and contracts for safety.  Reports poor sleep,good appetite and poor attention span.  Rates pain at 8 on pt inventory but did not verbalize to this Clinical research associate.  No overt s/s of SA withdrawal but does state he "feels bad".  Support and encouragement given. Cont POC and cont 15' checks for safety.

## 2012-03-31 NOTE — Progress Notes (Signed)
Patient ID: Ryan Long, male   DOB: 14-Feb-1961, 51 y.o.   MRN: [id number] Pt. attended and participated in aftercare planning group. Pt. accepted information on suicide prevention, warning signs to look for with suicide and crisis line numbers to use. The pt. agreed to call crisis line numbers if having warning signs or having thoughts of suicide. Pt. was present; however, patient stated that he did not want to talk.

## 2012-03-31 NOTE — H&P (Signed)
Psychiatric Admission Assessment Adult  Patient Identification:  Babak Lucus Date of Evaluation:  03/31/2012 Chief Complaint:  Drug and Alcohol Relapse  History of Present Illness:: One of several admissions for Paige was discharged from our unit about a week ago, he relapsed immediately upon discharge, and has been drinking and drugging since that time. He is requesting help getting stabilized again. He is vague today about depression and is unable to give me and numerical score overall he admits that he is depressed. He reports that he feels he can be safe here in the unit and is having no suicidal thoughts.  Mental Status Exam:  He presents in hospital scrubs, in full contact with reality, cooperative, with good eye contact, speech is logical, fluent, nonpressured, normal in form and production. Mood is mildly irritable, he displays no acute withdrawal symptoms and CIWA is gauged is 0. No dangerous thoughts. Impulse control, judgment, and insight are adequate. Fully oriented with cognition intact.  Mood Symptoms:  Depression, Depression Symptoms:  depressed mood, anxiety, (Hypo) Manic Symptoms:  denies Anxiety Symptoms:  denies Psychotic Symptoms:  denies  Past Psychiatric History: Recent discharge from our unit 1 week ago.   Diagnosis:  Hospitalizations:  Outpatient Care:  Substance Abuse Care:  Self-Mutilation:  Suicidal Attempts:  Violent Behaviors:   Past Medical History:   Past Medical History  Diagnosis Date  . HIV (human immunodeficiency virus infection)   . Chronic systolic heart failure   . Hypertension   . Genital herpes   . Active smoker   . AR (allergic rhinitis)   . HLD (hyperlipidemia)   . NICM (nonischemic cardiomyopathy)     Echocardiogram 06/28/11: EF 30-35%, mild MR, mild LAE;  No CAD by coronary CT angiogram 3/12 at [facility]  . NSVT (nonsustained ventricular tachycardia)   . Depression   . Bipolar 1 disorder   . Alcohol abuse   .  Crack cocaine use     Allergies:  No Known Allergies PTA Medications: Prescriptions prior to admission  Medication Sig Dispense Refill  . albuterol (PROVENTIL HFA;VENTOLIN HFA) 108 (90 BASE) MCG/ACT inhaler Inhale 2 puffs into the lungs as needed. Per pt med is as needed with no frequency for asthma.  1 Inhaler  0  . aspirin 81 MG tablet Take 1 tablet (81 mg total) by mouth daily. For decreased platelet aggregation.  30 tablet  0  . buPROPion (WELLBUTRIN XL) 150 MG 24 hr tablet Take 1 tablet (150 mg total) by mouth daily. For depression.  30 tablet  0  . carvedilol (COREG) 6.25 MG tablet Take 1 tablet (6.25 mg total) by mouth 2 (two) times daily with a meal. For hypertension.      . cetirizine (ZYRTEC) 10 MG tablet Take 1 tablet (10 mg total) by mouth daily. For seasonal allergies.      . clotrimazole (LOTRIMIN) 1 % cream Apply daily for fungal infection.  30 g    . digoxin (LANOXIN) 0.125 MG tablet Take 1 tablet (125 mcg total) by mouth daily. For control of heart arrythmia.      Marland Kitchen docusate sodium (COLACE) 100 MG capsule Take 1 capsule (100 mg total) by mouth 2 (two) times daily. For constipation.  10 capsule    . emtricitabine-tenofovir (TRUVADA) 200-300 MG per tablet Take 1 tablet by mouth daily. For HIV      . famotidine (PEPCID) 20 MG tablet Take 1 tablet (20 mg total) by mouth 2 (two) times daily. For GERD.      Marland Kitchen  furosemide (LASIX) 40 MG tablet Take 1 tablet (40 mg total) by mouth daily. For edema  30 tablet  0  . guaiFENesin (MUCINEX) 600 MG 12 hr tablet Take 2 tablets (1,200 mg total) by mouth 2 (two) times daily. For cough      . lisinopril (PRINIVIL,ZESTRIL) 5 MG tablet Take 1 tablet (5 mg total) by mouth daily. For hypertension      . Multiple Vitamin (MULTIVITAMIN) tablet Take 1 tablet by mouth daily. For vitamin deficiency      . naltrexone (DEPADE) 50 MG tablet Take 1 tablet (50 mg total) by mouth daily. For withdrawal.      . nicotine (NICODERM CQ - DOSED IN MG/24 HOURS) 21  mg/24hr patch Place 1 patch onto the skin daily at 6 (six) AM. For nicotine withdrawal  28 patch  0  . polyethylene glycol (MIRALAX / GLYCOLAX) packet Take 17 g by mouth daily. For constipation.  14 each    . potassium chloride SA (K-DUR,KLOR-CON) 20 MEQ tablet Take 1 tablet (20 mEq total) by mouth daily. For low potassium.      . raltegravir (ISENTRESS) 400 MG tablet Take 1 tablet (400 mg total) by mouth 2 (two) times daily. For HIV      . ranitidine (ZANTAC) 150 MG tablet Take 1 tablet (150 mg total) by mouth daily. For acid reflux      . traZODone (DESYREL) 150 MG tablet Take 1 tablet (150 mg total) by mouth at bedtime. For sleep.      . valACYclovir (VALTREX) 500 MG tablet Take 1 tablet (500 mg total) by mouth 2 (two) times daily. For Herpes.        Previous Psychotropic Medications:  Medication/Dose                 Substance Abuse History in the last 12 months: Substance Age of 1st Use Last Use Amount Specific Type  Nicotine      Alcohol      Cannabis      Opiates      Cocaine      Methamphetamines      LSD      Ecstasy      Benzodiazepines      Caffeine      Inhalants      Others:                         Consequences of Substance Abuse: Social History: Current Place of Residence:  Reports he is not going back to his girlfriend's house and is currently homeless.  Never went to [facility] as was previously planned.  Place of Birth:   Family Members: Marital Status:  Single Children:  Sons:  Daughters: Relationships: Education:  [employer] Problems/Performance: Religious Beliefs/Practices: History of Abuse (Emotional/Phsycial/Sexual) Teacher, music History:  None. Legal History: Hobbies/Interests:   Mental Status Examination/Evaluation: See Above Objective:  Appearance:   Eye Contact::    Speech:    Volume:    Mood:    Affect:    Thought Process:    Orientation:    Thought Content:    Suicidal Thoughts:      Homicidal Thoughts:    Memory:    Judgement:    Insight:    Psychomotor Activity:    Concentration:    Recall:    Akathisia:    Handed:    AIMS (if indicated):     Assets:    Sleep:  Number  of Hours: 3.25     I have medically and physically evaluated this patient and my findings are consistent with those of the emergency room.   Laboratory/X-Ray Psychological Evaluation(s)      Assessment:    AXIS I:  Alcohol Abuse; Cocaine Abuse AXIS II:  deferred AXIS III:  HIV Infection Past Medical History  Diagnosis Date  . HIV (human immunodeficiency virus infection)   . Chronic systolic heart failure   . Hypertension   . Genital herpes   . Active smoker   . AR (allergic rhinitis)   . HLD (hyperlipidemia)   . NICM (nonischemic cardiomyopathy)     Echocardiogram 06/28/11: EF 30-35%, mild MR, mild LAE;  No CAD by coronary CT angiogram 3/12 at [facility]  . NSVT (nonsustained ventricular tachycardia)   . Depression   . Bipolar 1 disorder   . Alcohol abuse   . Crack cocaine use    AXIS IV:  Possibly homeless AXIS V:  51-60 moderate symptoms  Treatment Plan/Recommendations:  Treatment Plan Summary: Resume previous medications. Librium 25mg  q 6 prn withdrawal.   Current Medications:  Current Facility-Administered Medications  Medication Dose Route Frequency Provider Last Rate Last Dose  . acetaminophen (TYLENOL) tablet 650 mg  650 mg Oral Q6H PRN Wonda Cerise, MD   650 mg at 03/31/12 1409  . albuterol (PROVENTIL HFA;VENTOLIN HFA) 108 (90 BASE) MCG/ACT inhaler 2 puff  2 puff Inhalation PRN Wonda Cerise, MD      . alum & mag hydroxide-simeth (MAALOX/MYLANTA) 200-200-20 MG/5ML suspension 30 mL  30 mL Oral Q4H PRN Wonda Cerise, MD      . buPROPion (WELLBUTRIN XL) 24 hr tablet 150 mg  150 mg Oral Daily Wonda Cerise, MD   150 mg at 03/31/12 0815  . carvedilol (COREG) tablet 6.25 mg  6.25 mg Oral BID WC Wonda Cerise, MD   6.25 mg at 03/31/12 0815  . chlordiazePOXIDE (LIBRIUM)  capsule 25 mg  25 mg Oral Q6H PRN Wonda Cerise, MD   25 mg at 03/30/12 2125  . chlordiazePOXIDE (LIBRIUM) capsule 25 mg  25 mg Oral Q4H PRN Viviann Spare, NP      . digoxin (LANOXIN) tablet 0.125 mg  0.125 mg Oral Daily Wonda Cerise, MD   0.125 mg at 03/31/12 0815  . docusate sodium (COLACE) capsule 100 mg  100 mg Oral BID Wonda Cerise, MD   100 mg at 03/31/12 0815  . emtricitabine-tenofovir (TRUVADA) 200-300 MG per tablet 1 tablet  1 tablet Oral Daily Wonda Cerise, MD   1 tablet at 03/31/12 0815  . famotidine (PEPCID) tablet 40 mg  40 mg Oral BID Wonda Cerise, MD   40 mg at 03/31/12 0815  . furosemide (LASIX) tablet 40 mg  40 mg Oral Daily Wonda Cerise, MD   40 mg at 03/31/12 0815  . guaiFENesin (MUCINEX) 12 hr tablet 1,200 mg  1,200 mg Oral BID PRN Wonda Cerise, MD      . hydrOXYzine (ATARAX/VISTARIL) tablet 50 mg  50 mg Oral QHS PRN,MR X 1 Viviann Spare, NP      . lisinopril (PRINIVIL,ZESTRIL) tablet 5 mg  5 mg Oral Daily Wonda Cerise, MD   5 mg at 03/31/12 0815  . loratadine (CLARITIN) tablet 10 mg  10 mg Oral Daily Wonda Cerise, MD   10 mg at 03/31/12 0815  . magnesium hydroxide (MILK OF MAGNESIA) suspension 30 mL  30 mL Oral Daily PRN Wonda Cerise, MD      .  mulitivitamin with minerals tablet 1 tablet  1 tablet Oral Daily Wonda Cerise, MD   1 tablet at 03/31/12 0815  . nicotine (NICODERM CQ - dosed in mg/24 hours) patch 21 mg  21 mg Transdermal Q0600 Wonda Cerise, MD   21 mg at 03/31/12 0440  . potassium chloride SA (K-DUR,KLOR-CON) CR tablet 20 mEq  20 mEq Oral Daily Wonda Cerise, MD   20 mEq at 03/31/12 0815  . raltegravir (ISENTRESS) tablet 400 mg  400 mg Oral BID Wonda Cerise, MD   400 mg at 03/31/12 0815  . traZODone (DESYREL) tablet 200 mg  200 mg Oral QHS Viviann Spare, NP      . valACYclovir (VALTREX) tablet 500 mg  500 mg Oral BID Wonda Cerise, MD   500 mg at 03/31/12 0815  . DISCONTD: ranitidine (ZANTAC) 150 MG/10ML syrup 150 mg  150 mg Oral Q breakfast Wonda Cerise, MD      . DISCONTD: traZODone  (DESYREL) tablet 150 mg  150 mg Oral QHS PRN Wonda Cerise, MD   150 mg at 03/30/12 2125   Facility-Administered Medications Ordered in Other Encounters  Medication Dose Route Frequency Provider Last Rate Last Dose  . DISCONTD: acetaminophen (TYLENOL) tablet 650 mg  650 mg Oral Q4H PRN Renne Crigler, PA      . DISCONTD: albuterol (PROVENTIL HFA;VENTOLIN HFA) 108 (90 BASE) MCG/ACT inhaler 2 puff  2 puff Inhalation PRN Renne Crigler, PA      . DISCONTD: alum & mag hydroxide-simeth (MAALOX/MYLANTA) 200-200-20 MG/5ML suspension 30 mL  30 mL Oral PRN Renne Crigler, PA      . DISCONTD: aspirin chewable tablet 81 mg  81 mg Oral Daily Renne Crigler, PA   81 mg at 03/30/12 0953  . DISCONTD: buPROPion (WELLBUTRIN XL) 24 hr tablet 150 mg  150 mg Oral Daily Renne Crigler, [place]   150 mg at 03/30/12 0948  . DISCONTD: carvedilol (COREG) tablet 6.25 mg  6.25 mg Oral BID WC Renne Crigler, PA   6.25 mg at 03/30/12 0944  . DISCONTD: digoxin (LANOXIN) tablet 125 mcg  125 mcg Oral Daily Renne Crigler, [place]   125 mcg at 03/30/12 0948  . DISCONTD: docusate sodium (COLACE) capsule 100 mg  100 mg Oral BID Renne Crigler, [place]      . DISCONTD: emtricitabine-tenofovir (TRUVADA) 200-300 MG per tablet 1 tablet  1 tablet Oral Daily Renne Crigler, [place]   1 tablet at 03/30/12 0949  . DISCONTD: famotidine (PEPCID) tablet 20 mg  20 mg Oral BID Renne Crigler, PA   20 mg at 03/30/12 0953  . DISCONTD: famotidine (PEPCID) tablet 20 mg  20 mg Oral Daily Renne Crigler, [place]      . DISCONTD: furosemide (LASIX) tablet 40 mg  40 mg Oral Daily Renne Crigler, [place]   40 mg at 03/30/12 0949  . DISCONTD: ibuprofen (ADVIL,MOTRIN) tablet 600 mg  600 mg Oral Q8H PRN Renne Crigler, PA      . DISCONTD: lisinopril (PRINIVIL,ZESTRIL) tablet 5 mg  5 mg Oral Daily Renne Crigler, [place]   5 mg at 03/30/12 0950  . DISCONTD: loratadine (CLARITIN) tablet 10 mg  10 mg Oral Daily Renne Crigler, [place]      . DISCONTD: LORazepam (ATIVAN) tablet 1 mg  1 mg Oral Q8H PRN Renne Crigler,  PA      . DISCONTD: mulitivitamin with minerals tablet 1 tablet  1 tablet Oral Daily Renne Crigler, [place]   1 tablet at 03/30/12 0954  . DISCONTD: naltrexone (DEPADE)  tablet 50 mg  50 mg Oral Daily Renne Crigler, [place]   50 mg at 03/30/12 0950  . DISCONTD: nicotine (NICODERM CQ - dosed in mg/24 hours) patch 21 mg  21 mg Transdermal Daily Renne Crigler, [place]      . DISCONTD: ondansetron (ZOFRAN) tablet 4 mg  4 mg Oral Q8H PRN Renne Crigler, PA      . DISCONTD: polyethylene glycol (MIRALAX / GLYCOLAX) packet 17 g  17 g Oral Daily Renne Crigler, [place]      . DISCONTD: potassium chloride SA (K-DUR,KLOR-CON) CR tablet 20 mEq  20 mEq Oral Daily Renne Crigler, [place]   20 mEq at 03/30/12 0952  . DISCONTD: raltegravir (ISENTRESS) tablet 400 mg  400 mg Oral BID Renne Crigler, PA   400 mg at 03/30/12 0954  . DISCONTD: traZODone (DESYREL) tablet 150 mg  150 mg Oral QHS Renne Crigler, [place]      . DISCONTD: valACYclovir (VALTREX) tablet 500 mg  500 mg Oral BID Renne Crigler, PA   500 mg at 03/30/12 8295    Observation Level/Precautions:    Laboratory:    Psychotherapy:    Medications:    Routine PRN Medications:    Consultations:    Discharge Concerns:    Other:     Cindee Mclester A 4/14/20132:11 PM

## 2012-03-31 NOTE — Progress Notes (Signed)
Pt positive for group, pleasant on approach, no complaints voiced.  Pt denies SI/HI/hallucinations.  Asked if his sleep medication had been increased and was pleased to learn that it was.  Support and encouragement offered, will continue to monitor.

## 2012-03-31 NOTE — Progress Notes (Signed)
[facility] Adult Inpatient Family/Significant Other Suicide Prevention Education  Suicide Prevention Education:  Patient Refusal for Family/Significant Other Suicide Prevention Education: The patient Ryan Long has refused to provide written consent for family/significant other to be provided Family/Significant Other Suicide Prevention Education during admission and/or prior to discharge.  Physician notified.  Pt. accepted information on suicide prevention, warning signs to look for with suicide and crisis line numbers to use. The pt. agreed to call crisis line numbers if having warning signs or having thoughts of suicide.    [facility] 03/31/2012, 3:28 PM

## 2012-04-01 NOTE — Treatment Plan (Signed)
Returning patient states that he is here for depression and substance use.  Was with a girlirend who was also using.  States that relationship is over.  Has been able to stay sober is past by immersing himself in church, staying away from negative influences, work and positive hobbies.  Plans to go to an [facility] from here.  Said he had already called one that has an opening, and will plan to go there at d/c.  Wants to know when he will be d/ced so he can let the [facility] know.  Assured him Dr will let him know when he meets with her.

## 2012-04-01 NOTE — Progress Notes (Signed)
[facility] Group Notes:  (Counselor/Nursing/MHT/Case Management/Adjunct)  04/01/2012 6:28 PM   Type of Therapy:  Processing Group at 11:00 am  Participation Level:  Active  Participation Quality:  Attentive and sharing  Affect:  Appropriate  Cognitive:  Appropriate  Insight:  Good  Engagement in Group:  Good  Engagement in Therapy:  Good  Modes of Intervention:  Exploration, problem solving and support  Summary of Progress/Problems:  Kino shared that his biggest obstacle to recovery will be his own thinking in addition to shame and guilt and refusal to discuss what he has done to cause harm when in active using. Patient offered support to others and when asked "what support would you give someone in your exact circumstances?" he shared "I would remind them that the man who choose those actions is not the man I want to be and can be"  [facility] Group Notes:  (Counselor/Nursing/MHT/Case Management/Adjunct)  04/01/2012 6:28 PM   Type of Therapy:  Counseling Group at 1:15 pm  Participation Level:  Active  Participation Quality:  Attentive  Affect:  Appropriate  Cognitive:  Appropriate  Engagement in Group:  Good  Engagement in Therapy:  Good  Summary of Progress/Problems:  Jp was attentive to presentation by representative from [facility] and inquired about location, operation hours etc and expressed his commitment to following up there.  Ronda Fairly, [place] 04/01/2012 6:35 PM

## 2012-04-01 NOTE — Progress Notes (Signed)
[facility] MD Progress Note  04/01/2012 3:42 PM  Diagnosis:  Axis I: Alcohol Abuse  ADL's:  Intact  Sleep: Poor  Appetite:  Fair  Suicidal Ideation:  denies Homicidal Ideation:  Denies   AEB (as evidenced by):  Mental Status Examination/Evaluation: Objective:  Appearance: Casual  Eye Contact::  Good  Speech:  Normal Rate  Volume:  Normal  Mood:  Anxious  Affect:  Congruent  Thought Process:  Goal Directed  Orientation:  Full  Thought Content:  WDL  Suicidal Thoughts:  No  Homicidal Thoughts:  No  Memory:  Immediate;   Fair  Judgement:  Intact  Insight:  Fair  Psychomotor Activity:  Normal  Concentration:  Good  Recall:  Fair  Akathisia:  No  Handed:    AIMS (if indicated):     Assets:  Communication Skills Desire for Improvement  Sleep:  Number of Hours: 2.5    Vital Signs:Blood pressure 116/81, pulse 78, temperature 96.7 F (35.9 C), temperature source Oral, resp. rate 20, SpO2 98.00%. Current Medications: Current Facility-Administered Medications  Medication Dose Route Frequency Provider Last Rate Last Dose  . acetaminophen (TYLENOL) tablet 650 mg  650 mg Oral Q6H PRN Wonda Cerise, MD   650 mg at 04/01/12 1405  . albuterol (PROVENTIL HFA;VENTOLIN HFA) 108 (90 BASE) MCG/ACT inhaler 2 puff  2 puff Inhalation PRN Wonda Cerise, MD      . alum & mag hydroxide-simeth (MAALOX/MYLANTA) 200-200-20 MG/5ML suspension 30 mL  30 mL Oral Q4H PRN Wonda Cerise, MD      . buPROPion (WELLBUTRIN XL) 24 hr tablet 150 mg  150 mg Oral Daily Wonda Cerise, MD   150 mg at 04/01/12 4098  . carvedilol (COREG) tablet 6.25 mg  6.25 mg Oral BID WC Wonda Cerise, MD   6.25 mg at 04/01/12 1191  . chlordiazePOXIDE (LIBRIUM) capsule 25 mg  25 mg Oral Q6H PRN Wonda Cerise, MD   25 mg at 03/31/12 2242  . chlordiazePOXIDE (LIBRIUM) capsule 25 mg  25 mg Oral Q4H PRN Viviann Spare, NP      . digoxin (LANOXIN) tablet 0.125 mg  0.125 mg Oral Daily Wonda Cerise, MD   0.125 mg at 04/01/12 4782  . docusate sodium  (COLACE) capsule 100 mg  100 mg Oral BID Wonda Cerise, MD   100 mg at 04/01/12 9562  . emtricitabine-tenofovir (TRUVADA) 200-300 MG per tablet 1 tablet  1 tablet Oral Daily Wonda Cerise, MD   1 tablet at 04/01/12 [phone]  . famotidine (PEPCID) tablet 40 mg  40 mg Oral BID Wonda Cerise, MD   40 mg at 04/01/12 6578  . furosemide (LASIX) tablet 40 mg  40 mg Oral Daily Wonda Cerise, MD   40 mg at 04/01/12 4696  . guaiFENesin (MUCINEX) 12 hr tablet 1,200 mg  1,200 mg Oral BID PRN Wonda Cerise, MD      . hydrOXYzine (ATARAX/VISTARIL) tablet 50 mg  50 mg Oral QHS PRN,MR X 1 Viviann Spare, NP      . lisinopril (PRINIVIL,ZESTRIL) tablet 5 mg  5 mg Oral Daily Wonda Cerise, MD   5 mg at 04/01/12 2952  . loratadine (CLARITIN) tablet 10 mg  10 mg Oral Daily Wonda Cerise, MD   10 mg at 04/01/12 8413  . magnesium hydroxide (MILK OF MAGNESIA) suspension 30 mL  30 mL Oral Daily PRN Wonda Cerise, MD      . mulitivitamin with minerals tablet 1 tablet  1 tablet Oral Daily Wonda Cerise, MD  1 tablet at 04/01/12 9604  . nicotine (NICODERM CQ - dosed in mg/24 hours) patch 21 mg  21 mg Transdermal Q0600 Wonda Cerise, MD   21 mg at 04/01/12 0616  . potassium chloride SA (K-DUR,KLOR-CON) CR tablet 20 mEq  20 mEq Oral Daily Wonda Cerise, MD   20 mEq at 04/01/12 5409  . raltegravir (ISENTRESS) tablet 400 mg  400 mg Oral BID Wonda Cerise, MD   400 mg at 04/01/12 8119  . traZODone (DESYREL) tablet 200 mg  200 mg Oral QHS Viviann Spare, NP   200 mg at 03/31/12 2241  . valACYclovir (VALTREX) tablet 500 mg  500 mg Oral BID Wonda Cerise, MD   500 mg at 04/01/12 1478    Lab Results: No results found for this or any previous visit (from the past 48 hour(s)).  Physical Findings: AIMS:  , ,  ,  ,    CIWA:  CIWA-Ar Total: 3  COWS:  COWS Total Score: 1   Treatment Plan Summary: Daily contact with patient to assess and evaluate symptoms and progress in treatment Medication management  Plan: Pt. Is planning to go to an [facility] upon his  discharge. Mcguire Gasparyan 04/01/2012, 3:42 PM

## 2012-04-02 DIAGNOSIS — F3130 Bipolar disorder, current episode depressed, mild or moderate severity, unspecified: Secondary | ICD-10-CM

## 2012-04-02 DIAGNOSIS — F10988 Alcohol use, unspecified with other alcohol-induced disorder: Principal | ICD-10-CM

## 2012-04-02 NOTE — Progress Notes (Signed)
Recreation Therapy Group Note  Date: 04/02/2012        Time: 1000       Group Topic/Focus: Patient invited to participate in animal assisted therapy. Pets as a coping skill and responsibility were discussed.   Participation Level: Minimal  Participation Quality: Redirectable  Affect: Irritable  Cognitive: Oriented   Additional Comments: Talking to a peer about his discharge plan, interacting little with the therapy dog or the rest of the group.

## 2012-04-02 NOTE — Progress Notes (Signed)
Late entry for 04/01/12 Pt has been up for his groups today.  He rated all his depression hopelessness and anxiety a 7 on his self-inventory today.  He denied any symptoms of withdrawal and his only prn request was for tylenol at 1405 for a headache which was relieved by the tylenol.  His biggest concern is he wants a appointment set up at [facility] when he is discharged and wants help with medications.  Informed his that the doctor has to make those referrals so be sure and talk with her and tell her about his medication needs as well.  He did voice understanding.  This nurse discussed with Jennette Kettle PA and she is aware.  Pt plans to go to an [facility] from here and not to go anywhere else before he gets there.  He had no new orders today.

## 2012-04-02 NOTE — Progress Notes (Signed)
Patient ID: Ryan Long, male   DOB: Oct 22, 1961, 51 y.o.   MRN: [id number]  Writer entered pt's room and attempted to encourage him to attend groups. Pt stated that he'd gone to group, but didn't enjoy it like he did "last night". Stated that a woman was doing the group and then he mumbled something about women. Writer asked pt about events surrounding his last discharge. Pt stated that he was supposed to go to the [facility], but didn't. "I wanna go to the [facility], but the people here are 'pussy-footing around'. If they would go ahead and discharge me before they run out of space". Support and encouragement was offered.

## 2012-04-02 NOTE — Progress Notes (Signed)
Patient at the window this evening; complaint of headache. His mood and affect sad and blunted, He reported having headache of 7/10. Tylenol given for the headache; received all his medications without difficulty. Denied SI/HI and denied hallucination. Q 15 minute checks continues to maintain safety.

## 2012-04-02 NOTE — Progress Notes (Signed)
[facility] Group Notes:  (Counselor/Nursing/MHT/Case Management/Adjunct)  04/02/2012 5:19 PM  Type of Therapy:  Group Therapy at 11:00AM  Participation Level:  Active  Participation Quality:  Attentive, Intrusive and Sharing  Affect:  Appropriate  Cognitive:  Oriented  Insight:  Limited  Engagement in Group:  Good  Engagement in Therapy:  Limited  Modes of Intervention:  Clarification, Socialization and Support  Summary of Progress/Problems:  Patient actively shared to point of excess and when attention was withdrawn from him and focus on solution to his delima was shared with others patient left group room. Patient shared that while he "njoyed being sober because of absence of negative consequences caused by substance use... had too much free time when sober... And has often gone back to using because he had nothing to replace the time with.     Clide Dales 04/02/2012, 5:25 PM

## 2012-04-02 NOTE — Progress Notes (Signed)
Pt reports depression as a 5 and hopeless as 5. Pt is flat and anxious. He denies si and c/o headache as a 6 on a 1-10 scale. No other complaints or discomfort reported. Offered support, encouragement and prn medication. Checked pulse manually before giving digoxin. Pulse was 106 and rechecked at 100. Reported to PA. Per PA go ahead and give medication. Safety maintained on unit.

## 2012-04-03 DIAGNOSIS — F1410 Cocaine abuse, uncomplicated: Secondary | ICD-10-CM

## 2012-04-03 MED ORDER — LORAZEPAM 1 MG PO TABS
1.0000 mg | ORAL_TABLET | Freq: Once | ORAL | Status: AC
  Administered 2012-04-03: 1 mg via ORAL
  Filled 2012-04-03: qty 1

## 2012-04-03 NOTE — Progress Notes (Signed)
Cosigned by Carney Bern, LCSWA 4/17/20133:22 PM

## 2012-04-03 NOTE — Progress Notes (Signed)
[facility] MD Progress Note  04/03/2012 11:04 AM   S/O: Pt seen and evaluated in treatment team.  Reviewed short term and long term goals, medications, current treatment in the hospital and acute/chronic safety.  Pt denied any current thoughts of self harm, suicidal ideation or homicidal ideation.  Contracted for safety on the unit.  No acute issues noted.  VS reviewed with team.  Interview with [employer].  Patient stated that his mood was "a lot better". His affect was mood congruent and euthymic. He denied any current thoughts of self injurious behavior, suicidal ideation or homicidal ideation. Issue this am regarding anger management resolved.  There were no auditory or visual hallucinations, paranoia, delusional thought processes, or mania noted.  Thought process was linear and goal directed.  No psychomotor agitation or retardation was noted. His speech was normal rate, tone and volume. Eye contact was good. Judgment and insight are fair.  Patient has been up and engaged on the unit.  No acute safety concerns reported from team.  Depression "2" and anxiety a "1" out of 10.  Sleep:  Number of Hours: 4.75    Vital Signs:Blood pressure 126/75, pulse 82, temperature 97 F (36.1 C), temperature source Oral, resp. rate 18, SpO2 98.00%.  Lab Results: No results found for this or any previous visit (from the past 48 hour(s)).  Physical Findings: CIWA:  CIWA-Ar Total: 3  COWS:  COWS Total Score: 1   A/P: Alcohol Dependence & W/D; SIMD  Interview tonight for [employer]. D/c tomorrow planned. Continue current meds with taper off lorazepam taper.  Medication education completed.  Pros, cons, risks, potential side effects and benefits were discussed with pt.  Pt agreeable with the plan.  See orders.  Discussed with team.  Ryan Long 04/03/2012, 11:04 AM

## 2012-04-03 NOTE — Treatment Plan (Signed)
Interdisciplinary Treatment Plan Update (Adult)  Date: 04/03/2012  Time Reviewed: 9:42 AM   Progress in Treatment: Attending groups: Yes Participating in groups: Yes Taking medication as prescribed: Yes Tolerating medication: Yes   Family/Significant othe contact made:   Patient understands diagnosis:  Yes  As evidenced by asking for help with substance abuse Discussing patient identified problems/goals with staff:  Yes  See below Medical problems stabilized or resolved:  Yes Denies suicidal/homicidal ideation: Yes  In tx team Issues/concerns per patient self-inventory:  Yes  But this was after a bad morning.  Now reports his depression is a 2, anxiety is a 2.  Feels hopeful about plan Other:  New problem(s) identified: N/A  Reason for Continuation of Hospitalization: Medication stabilization  Interventions implemented related to continuation of hospitalization:  Encourage group attendance and participation,  CM to make referrals to services  Additional comments:  Estimated length of stay: 1 day  Discharge Plan: Stay at [facility] in [place], follow up at [facility]): N/A  Review of initial/current patient goals per problem list:   1.  Goal(s): Identify comprehensive sobriety plan  Met:  Yes  Target date:4/17  As evidenced by: [name] states he will go to an [employer], follow up at [facility] in [place], attend NA mtgs  2.  Goal (s): Get follow up appointments as needed  Met:  No  Target date:4/17  As evidenced by: CM to pursue follow up with [facility] in [place]  3.  Goal(s):  Met:  Yes  Target date:  As evidenced by:  4.  Goal(s):  Met:  Yes  Target date:  As evidenced by:  Attendees: Patient:  Ryan Long 04/03/2012  9:42 AM  Family:     Physician:  Lupe Carney 04/03/2012 9:42 AM   Nursing: Robbie Louis   04/03/2012 9:42 AM   Case Manager:  Richelle Ito, LCSW 04/03/2012 9:42 AM   Counselor:  Ronda Fairly,  LCSWA 04/03/2012 9:42 AM   Other:     Other:     Other:     Other:      Scribe for Treatment Team:   Ida Rogue, 04/03/2012 9:42 AM

## 2012-04-03 NOTE — Discharge Planning (Signed)
Pt present in morning group. Agitated mood. Pt voiced his frustrations about being in the hospital. States "I feel like I'm being put off by the staff here" Pt stated that he planned to go to an [facility] when he d/c from the hospital. Pt then asked CM what his exact d/c date would be. CM explained to pt that his d/c date would be determined by the doctor. Pt then became angy and hostile and began to yell and curse at [facility]. An attempt was made to de-escalate situation with pt which only made him more hostile. Request was made for assistance from [facility] tech and pt was asked to leave group. He complied.  Was seen after group individually and in treatment team.  Norvel acknowledged he then felt heard,  And the issue was resolved.

## 2012-04-03 NOTE — Discharge Instructions (Signed)
Chronic Alcoholism  Alcoholism is an addiction to alcohol. Addiction is a medical illness. It is not an one-time incident of heavy drinking that defines the disease of alcoholism.   The characteristics of addictive disease, such as alcoholism, include behaviors that the person finds pleasurable, at least initially. In alcohol addiction, drinking causes chemical changes in brain activity. This can lead to frequent cravings for alcohol. Unfortunately over time, an increased amount of alcohol is needed to produce the pleasure (tolerance). As a result, the person will start to feel uncomfortable symptoms when he or she is not drinking (withdrawal).  Over time, a bad cycle develops. When painful withdrawal symptoms start to appear, alcohol is needed to make the symptoms go away. During this process, the person addicted to alcohol may become so used to the effects of alcohol that the usual signs of intoxication such as slurred speech, a staggering walk, or sleepiness may no longer be shown. Many people addicted to alcohol are able to function and even complete tasks.  CAUSES    Drinking heavily and frequently.   Other factors like genetics.  SYMPTOMS    Headaches.   Frequent trouble falling or staying asleep (insomnia).   Irritability.   Uncontrolled shaking or movement (tremors).   Forgetting events (brownouts) or passing out (blackouts).   Seizures or hallucinations (delirium tremens).   Problems at work or at home that are related to drinking.   Medical problems related to drinking such as heart disease, stroke, high blood pressure, diabetes, stomach ulcers, bleeding from the GI tract, and liver failure.   Trauma (falls, broken bones, automobile crashes).  TREATMENT   Alcoholism usually gets worse over time and almost never gets better without treatment. Your caregiver can help recommend a course of treatment for you depending on how severe your symptoms are and the level of your alcohol abuse. In some  patients, stopping alcohol use or even decreasing use can bring about withdrawal symptoms that are dangerous or even deadly. For this reason, hospitalization is sometimes required to medically stabilize a patient.   If hospitalization is not required, but the risk of withdrawal is high, a detoxification (detox) facility may be recommended as an initial treatment step. In a detox center, medications can be given to protect against seizures and other withdrawal symptoms.  Rehabilitation treatment may also be necessary. This is the process of treating the psychological and lifestyle element of addiction. There is both inpatient and outpatient rehabilitation treatment. Inpatient programs help patients through a systematic plan of psychological questioning, both individually and in groups, and at times using a "twelve-step" format. Outpatient rehabilitation programs have a similar structure and are aimed at promoting continued sobriety and preventing relapse into addiction.  Make treatment decisions together with your caregiver.  HOME CARE INSTRUCTIONS    If you are concerned about your alcohol use, talk to someone who can help. Trying to quit on your own is not easy. It can even be medically dangerous. This is especially true if you have been drinking heavily for a long time.   Call your caregiver, Alcoholics Anonymous, or other alcoholic treatment programs for help.   AL-ANON and ALA-TEEN are support groups for friends and family members of an alcohol or drug dependent person. These people also often need help too. For information about these organizations, check your phone directory or the internet. You can also call a local alcohol or chemical dependency treatment center.  Document Released: 01/11/2005 Document Revised: 11/23/2011 Document Reviewed: 05/20/2010  ExitCare 

## 2012-04-03 NOTE — Progress Notes (Signed)
[facility] Group Notes:  (Counselor/Nursing/MHT/Case Management/Adjunct)  04/03/2012 2:48 PM  Type of Therapy:  1:15PM Group Therapy  Participation Level:  Active  Participation Quality:  Appropriate, Attentive and Sharing  Affect:  Appropriate  Cognitive:  Alert and Appropriate  Insight:  Good  Engagement in Group:  Good  Engagement in Therapy:  Good  Modes of Intervention:  Clarification, Education, Support and Exploration  Summary of Progress/Problems: Patient seemed to relate well to the thoughts and ideas of his peers in discussing emotion regulation. Patient reported although he tries to suppress his emotions/feelings by putting them "in a closet", those same emotions are still present. Patient reports he feels like his addiction is like "a seed that has been planted, and I can't uproot it". Patient stated he would often try to numb his pain by using drugs and alcohol. Patient also mentioned his frustrations with staff members.    Ryan Long 04/03/2012, 2:48 PM

## 2012-04-03 NOTE — Progress Notes (Signed)
Cosigned by Carney Bern, LCSWA 4/17/20133:23 PM

## 2012-04-03 NOTE — Progress Notes (Signed)
Pt has been up for most groups today.  He became agitated this morning while the MHT was watching pt shave.  The tech had told pt that he had 5 more minutes and pt continued to shave.  Again, he tech told him her only had a couple of more minutes and that is when pt became irate and refused to turn the razor over.  Finally, pt did give back and the PA and Shawn T. AD talked with pt in private. This nurse is unsure of those results.  Pt was appropriate while interacting with this nurse.  PA did order a one time dose of 1 mg ativan po at 0917 and it was given.  Pt reported he did feel "better" upon reassessment.  He rated his depression hopelessness and anxiety a 2 today.  He denied any S/H ideations or A/V hallucinations.  He is having a phone interview with someone from [facility] in [place]. To se if he can stay at that [facility].  Pt does plan to discharge tomorrow if all goes as planned.  Pt is wanting to ensure he has a f/u appointment with [facility] and a supply of medications to help him until his next appointment.  He did let that be known in treatment team this morning.

## 2012-04-03 NOTE — Progress Notes (Addendum)
Patient ID: Ryan Long, male   DOB: 07/30/1961, 51 y.o.   MRN: [id number]  Patient was pleasant and cooperative during the assessment. Pt was in the dayroom watching [employer] and informed the writer that he had no questions, or complaints. Support and encouragement was offered.  Pt opened up more during med pass. Stated that he called [facility], but was not given an interview as he was informed earlier.Stated he called at 1900 and was told that some of the people were in a meeting. Pt was instructed to call back at 2030, however he informed them that his AA mtg would be over at 2100, and was told it would be ok to call then. Pt states when he called back he was told that some of the people were asleep, and that maybe he should go to [employer] first, and get a job. Eventually person to him to "come by on Sunday". States he spoke to [employer] and the [employer]. Spelling of names was provided by the patient. Support and encouragement was offered.

## 2012-04-03 NOTE — Progress Notes (Signed)
[facility] Group Notes:  (Counselor/Nursing/MHT/Case Management/Adjunct)  04/03/2012 12:34 PM  Type of Therapy:  Group Therapy  Participation Level:  Did Not Attend  Ryan Long 04/03/2012, 12:34 PM

## 2012-04-03 NOTE — Progress Notes (Signed)
[facility] Case Management Discharge Plan:  Will you be returning to the same living situation after discharge: No. At discharge, do you have transportation home?:Yes,  bus pass, PART $ Do you have the ability to pay for your medications:Yes,  [facility]  Interagency Information:     Release of information consent forms completed and in the chart;  Patient's signature needed at discharge.  Patient to Follow up at:  Follow-up Information    Follow up with [facility]. (You will need to call set up an appointment to see a Dr.  [name] would not take a referral from me)    Contact information:   [name]   [facility]  [phone]      Follow up with [facility] on 04/08/2012. (8 AM)    Contact information:   [address]  [name]  [phone]         Patient denies SI/HI:   Yes,  yes    Aeronautical engineer and Suicide Prevention discussed:  Yes,  yes  Barrier to discharge identified:No.  Summary and Recommendations:   Ryan Long 04/03/2012, 2:34 PM

## 2012-04-03 NOTE — Progress Notes (Signed)
Counselor went over Post Acute Withdrawal Syndrome (PAWS) symptoms and tools to cope with in one on one conversation as patient will likely be discharging Thursday 04/04/2012. Ethan seemed to appreciate individual attention.  Clide Dales 04/03/2012 3:26 PM

## 2012-04-04 MED ORDER — VALACYCLOVIR HCL 500 MG PO TABS: 500.0000 mg | ORAL_TABLET | Freq: Two times a day (BID) | ORAL | Status: DC

## 2012-04-04 MED ORDER — BUPROPION HCL ER (XL) 150 MG PO TB24: 150.0000 mg | ORAL_TABLET | Freq: Every day | ORAL | Status: DC

## 2012-04-04 MED ORDER — CARVEDILOL 6.25 MG PO TABS: 6.2500 mg | ORAL_TABLET | Freq: Two times a day (BID) | ORAL | Status: DC

## 2012-04-04 MED ORDER — RANITIDINE HCL 150 MG PO TABS: 150.0000 mg | ORAL_TABLET | Freq: Every day | ORAL | Status: DC

## 2012-04-04 MED ORDER — DIGOXIN 125 MCG PO TABS: 125.0000 ug | ORAL_TABLET | Freq: Every day | ORAL | Status: DC

## 2012-04-04 MED ORDER — POTASSIUM CHLORIDE CRYS ER 20 MEQ PO TBCR: 20.0000 meq | EXTENDED_RELEASE_TABLET | Freq: Every day | ORAL | Status: DC

## 2012-04-04 MED ORDER — LORATADINE 10 MG PO TABS: 10.0000 mg | ORAL_TABLET | Freq: Every day | ORAL | Status: DC

## 2012-04-04 MED ORDER — RALTEGRAVIR POTASSIUM 400 MG PO TABS: 400.0000 mg | ORAL_TABLET | Freq: Two times a day (BID) | ORAL | Status: DC

## 2012-04-04 MED ORDER — LISINOPRIL 5 MG PO TABS: 5.0000 mg | ORAL_TABLET | Freq: Every day | ORAL | Status: DC

## 2012-04-04 MED ORDER — DOCUSATE SODIUM 100 MG PO CAPS: 100.0000 mg | ORAL_CAPSULE | Freq: Two times a day (BID) | ORAL | Status: DC

## 2012-04-04 MED ORDER — ONE-DAILY MULTI VITAMINS PO TABS: 1.0000 | ORAL_TABLET | Freq: Every day | ORAL | Status: DC

## 2012-04-04 MED ORDER — NALTREXONE HCL 50 MG PO TABS: 50.0000 mg | ORAL_TABLET | Freq: Every day | ORAL | Status: DC

## 2012-04-04 MED ORDER — EMTRICITABINE-TENOFOVIR DF 200-300 MG PO TABS: 1.0000 | ORAL_TABLET | Freq: Every day | ORAL | Status: DC

## 2012-04-04 MED ORDER — CETIRIZINE HCL 10 MG PO TABS: 10.0000 mg | ORAL_TABLET | Freq: Every day | ORAL | Status: DC

## 2012-04-04 MED ORDER — FAMOTIDINE 20 MG PO TABS: 20.0000 mg | ORAL_TABLET | Freq: Two times a day (BID) | ORAL | Status: DC

## 2012-04-04 MED ORDER — ALBUTEROL SULFATE HFA 108 (90 BASE) MCG/ACT IN AERS: 2.0000 | INHALATION_SPRAY | RESPIRATORY_TRACT | Status: DC | PRN

## 2012-04-04 MED ORDER — GUAIFENESIN ER 600 MG PO TB12: 1200.0000 mg | ORAL_TABLET | Freq: Two times a day (BID) | ORAL | Status: DC

## 2012-04-04 MED ORDER — NICOTINE 21 MG/24HR TD PT24: 1.0000 | MEDICATED_PATCH | Freq: Every day | TRANSDERMAL | Status: AC

## 2012-04-04 MED ORDER — FUROSEMIDE 40 MG PO TABS: 40.0000 mg | ORAL_TABLET | Freq: Every day | ORAL | Status: DC

## 2012-04-04 MED ORDER — ASPIRIN 81 MG PO TABS: 81.0000 mg | ORAL_TABLET | Freq: Every day | ORAL | Status: DC

## 2012-04-04 MED ORDER — CLOTRIMAZOLE 1 % EX CREA: TOPICAL_CREAM | CUTANEOUS | Status: DC

## 2012-04-04 MED ORDER — TRAZODONE HCL 150 MG PO TABS: 150.0000 mg | ORAL_TABLET | Freq: Every day | ORAL | Status: DC

## 2012-04-04 NOTE — Progress Notes (Addendum)
Pt contacted [facility] tonight for an appt upon discharge. Unfortunately pt said that nothing was solved and he has to call back tomorrow to possibly schedule a face-2-face interview. Pt is not happy and feels like giving up on all places  plans. I gave pt hope in receiving good news tomorrow. We also touch bases on his other d/c plans such as [facility] and continuation at [facility]. Continued support and availability as needed has been extended to this pt. Pt safety remains with q29min checks.  Pt complained of chest pain of 8/10 this morning with worsening sharp pain with deep breaths. Pt denies any other pain such as in the arms. Pt denies any N&V. Pt does complain of a HA, on call Dr said it was ok for tylenol and to be administered at this time. Pt manual Bp was 118/80 sitting. Dr has been notified and has instructed nurse to reassure pt that everything will be ok and that she will be here in 10-15 mins. This information has been communicated to his new oncoming nurse.

## 2012-04-04 NOTE — Progress Notes (Signed)
[facility] Group Notes:  (Counselor/Nursing/MHT/Case Management/Adjunct)  04/04/2012 3:39 PM  Type of Therapy:  Group Therapy at 1:15  Participation Level:  Active  Participation Quality:  Monopolizing and Sharing  Affect:  Anxious for attention  Cognitive:  Alert and Oriented  Insight:  Limited  Engagement in Group:  Limited  Engagement in Therapy:  Limited  Modes of Intervention:  Activity, Clarification and Support  Summary of Progress/Problems: Ryan Long participated in group activity in which he choose two photos to represent extreme levels of balance in life.  The first represented "all I left undone because my priority was drinking and using" He also chose a phot of beach to re[resent "life going well"; "on vacation at beach, reaping the benefits of a healthy life style."   Ryan Long 04/04/2012, 3:39 PM

## 2012-04-04 NOTE — Progress Notes (Signed)
[facility] Group Notes:  (Counselor/Nursing/MHT/Case Management/Adjunct)  04/04/2012 2:31 PM  Type of Therapy:  Group Therapy  Participation Level:  Active  Participation Quality:  Appropriate, Attentive, Monopolizing and Sharing  Affect:  Appropriate  Cognitive:  Alert and Appropriate  Insight:  Good  Engagement in Group:  Good  Engagement in Therapy:  Good  Modes of Intervention:  Clarification, Education, Support and Exploration  Summary of Progress/Problems: In discussing life and balance, patient reported in his life he has experienced too much disappointments. Patient reports his life is imbalanced because he does not get enough positive recreation, leisure, food, shelter, and clothing. Patient reported a balanced life to him would consist of a good living environment and changing people, placed, and things. Patient stated "guilt and shame" has a hold on him.    Ryan Long 04/04/2012, 2:31 PM

## 2012-04-04 NOTE — Discharge Summary (Signed)
Physician Discharge Summary Note  Patient:  Ryan Long is an 51 y.o., male MRN:  [id number] DOB:  04-01-1961 Patient phone:  [phone] (home)  Patient address:   [address] [place] [address],   Date of Admission:  03/30/2012 Date of Discharge: 04/05/2012  Reason for Admission: relapse   Discharge Diagnoses: Active Problems:  * No active hospital problems. *    Axis Diagnosis:  AXIS I: Alcohol Dependence; Cocaine Abuse; SIMD  AXIS II: Deferred  AXIS III:  Past Medical History   Diagnosis  Date   .  HIV (human immunodeficiency virus infection)    .  Chronic systolic heart failure    .  Hypertension    .  Genital herpes    .  Active smoker    .  AR (allergic rhinitis)    .  HLD (hyperlipidemia)    .  NICM (nonischemic cardiomyopathy)      Echocardiogram 06/28/11: EF 30-35%, mild MR, mild LAE; No CAD by coronary CT angiogram 3/12 at [facility]   .  NSVT (nonsustained ventricular tachycardia)    .  Depression    .  Bipolar 1 disorder    .  Alcohol abuse    .  Crack cocaine use     AXIS IV: Moderate  AXIS GAF: 45  Level of Care:  Outpatient  Hospital Course:  Hall was admitted for detox from alcohol, cocaine, and crisis management after he relapsed immediately upon discharge. He was treated with the standard Librium protocol.  Medical problems were identified and treated.  Home medication was restarted as appropriate.     Improvement was monitored by CIWA/COWS scores and patient's daily report of withdrawal symptom reduction. Emotional and mental status was monitored by daily self inventory reports completed by the patient and clinical staff.      The patient was evaluated by the treatment team for stability and plans for continued recovery upon discharge. He/She was offered further treatment options upon discharge including Residential, IOP, and Outpatient treatment.  The patient's motivation was an integral factor for scheduling further  treatment.  Employment, transportation, bed availability, health status, family support, and any pending legal issues were also considered. Trevonte had difficulty with plans for discharge as he felt his needs weren't being met. After discussion of his concerns with the Assistant Director Edrees was much more satisfied and a workable plan for discharge was developed. He will go to [facility] on Monday, and follow up with the [name] in [place].    Upon completion of detox the patient was both mentally and medically stable for discharge.    Consults: None  Significant Diagnostic Studies:  None  Discharge Vitals:   Blood pressure 117/82, pulse 88, temperature 96.8 F (36 C), temperature source Oral, resp. rate 18, SpO2 99.00%.  Mental Status Exam: See Mental Status Examination and Suicide Risk Assessment completed by Attending Physician prior to discharge.  Discharge destination:  [facility]  Is patient on multiple antipsychotic therapies at discharge:  No   Has Patient had three or more failed trials of antipsychotic monotherapy by history:  No Recommended Plan for Multiple Antipsychotic Therapies: not applicable     Discharge Orders    Future Appointments: Provider: Department: Dept Phone: Center:   04/08/2012 11:30 AM [facility] [phone] None   04/08/2012 3:15 PM Gardiner Barefoot, MD [facility] [phone] [facility]   04/08/2012 3:30 PM [facility] [phone] [facility]  Future Orders Please Complete By Expires   Diet - low sodium heart healthy      Increase activity slowly      Discharge instructions      Comments:   Take all medications as prescribed.     Medication List  As of 04/04/2012  8:36 AM   STOP taking these medications         polyethylene glycol packet         TAKE these medications      Indication    albuterol 108 (90 BASE) MCG/ACT inhaler   Commonly known as: PROVENTIL HFA;VENTOLIN HFA     Inhale 2 puffs into the lungs as needed for shortness of breath. Per pt med is as needed with no frequency for asthma.       aspirin 81 MG tablet   Take 1 tablet (81 mg total) by mouth daily. For decreased platelet aggregation.       buPROPion 150 MG 24 hr tablet   Commonly known as: WELLBUTRIN XL   Take 1 tablet (150 mg total) by mouth daily. For depression.       carvedilol 6.25 MG tablet   Commonly known as: COREG   Take 1 tablet (6.25 mg total) by mouth 2 (two) times daily with a meal. For hypertension.       cetirizine 10 MG tablet   Commonly known as: ZYRTEC   Take 1 tablet (10 mg total) by mouth daily. For seasonal allergies.       clotrimazole 1 % cream   Commonly known as: LOTRIMIN   Apply daily for fungal infection.       digoxin 0.125 MG tablet   Commonly known as: LANOXIN   Take 1 tablet (125 mcg total) by mouth daily. For control of heart arrythmia.       docusate sodium 100 MG capsule   Commonly known as: COLACE   Take 1 capsule (100 mg total) by mouth 2 (two) times daily. For constipation.       emtricitabine-tenofovir 200-300 MG per tablet   Commonly known as: TRUVADA   Take 1 tablet by mouth daily. For HIV       famotidine 20 MG tablet   Commonly known as: PEPCID   Take 1 tablet (20 mg total) by mouth 2 (two) times daily. For GERD.       furosemide 40 MG tablet   Commonly known as: LASIX   Take 1 tablet (40 mg total) by mouth daily. For edema       guaiFENesin 600 MG 12 hr tablet   Commonly known as: MUCINEX   Take 2 tablets (1,200 mg total) by mouth 2 (two) times daily. For cough       lisinopril 5 MG tablet   Commonly known as: PRINIVIL,ZESTRIL   Take 1 tablet (5 mg total) by mouth daily. For hypertension       loratadine 10 MG tablet   Commonly known as: CLARITIN   Take 1 tablet (10 mg total) by mouth daily. For seasonal allergies.       multivitamin tablet   Take 1 tablet by mouth daily. For vitamin deficiency       naltrexone 50 MG  tablet   Commonly known as: DEPADE   Take 1 tablet (50 mg total) by mouth daily. For withdrawal.       nicotine 21 mg/24hr patch   Commonly known as: NICODERM CQ - dosed in mg/24 hours   Place 1 patch  onto the skin daily at 6 (six) AM. For nicotine withdrawal       potassium chloride SA 20 MEQ tablet   Commonly known as: K-DUR,KLOR-CON   Take 1 tablet (20 mEq total) by mouth daily. For low potassium.       raltegravir 400 MG tablet   Commonly known as: ISENTRESS   Take 1 tablet (400 mg total) by mouth 2 (two) times daily. For HIV       ranitidine 150 MG tablet   Commonly known as: ZANTAC   Take 1 tablet (150 mg total) by mouth daily. For acid reflux       traZODone 150 MG tablet   Commonly known as: DESYREL   Take 1 tablet (150 mg total) by mouth at bedtime. For sleep.       valACYclovir 500 MG tablet   Commonly known as: VALTREX   Take 1 tablet (500 mg total) by mouth 2 (two) times daily. For Herpes.            Follow-up Information    Follow up with [facility]. (You will need to call set up an appointment to see a Dr.  [name] would not take a referral from me)    Contact information:   [name]   [facility]  [phone]      Follow up with [facility] on 04/08/2012. (8 AM)    Contact information:   [address]  [place]  [[phone]] [phone]         Follow-up recommendations:  Diet, Exercise, and Activity as noted above  Comments:  none  Signed: Tamala Julian [facility] for Dr. Lupe Carney  04/04/2012, 8:36 AM

## 2012-04-04 NOTE — [facility] Suicide Risk Assessment (Signed)
Suicide Risk Assessment  Discharge Assessment     Demographic factors: Unemployed  Current Mental Status Per Nursing Assessment::   At Discharge:   (denied SI & HI)  Current Mental Status Per Physician: Patient seen and evaluated. Chart reviewed. Patient stated that his mood was "good". His affect was mood congruent and euthymic. He denied any current thoughts of self injurious behavior, suicidal ideation or homicidal ideation. He denied any significant depressive signs or symptoms at this time. There were no auditory or visual hallucinations, paranoia, delusional thought processes, or mania noted.  Thought process was linear and goal directed.  No psychomotor agitation or retardation was noted. His speech was normal rate, tone and volume. Eye contact was good. Judgment and insight are fair.  Patient has been up and engaged on the unit.  No acute safety concerns reported from team.  Frustrated by inability to secure interview at [facility] last night, team assisting with placement today for early am discharge on 4.19.13.  Loss Factors: Homeless  Historical Factors: Difficulty with anger management; Hx anxiety and depressive s/s in context of SUDs and medical problems  Risk Reduction Factors:  Sense of responsibility to family;Religious beliefs about death;Positive social support;Positive therapeutic relationship;Positive coping skills or problem solving skills; interested in [facility] placement  Continued Clinical Symptoms: frustration intolerance.  Discharge Diagnoses:   AXIS I:  Alcohol Dependence; Cocaine Abuse; SIMD AXIS II: Deferred AXIS III:   Past Medical History  Diagnosis Date  . HIV (human immunodeficiency virus infection)   . Chronic systolic heart failure   . Hypertension   . Genital herpes   . Active smoker   . AR (allergic rhinitis)   . HLD (hyperlipidemia)   . NICM (nonischemic cardiomyopathy)     Echocardiogram 06/28/11: EF 30-35%, mild MR, mild LAE;  No CAD by  coronary CT angiogram 3/12 at [facility]  . NSVT (nonsustained ventricular tachycardia)   . Depression   . Bipolar 1 disorder   . Alcohol abuse   . Crack cocaine use    AXIS IV: Moderate AXIS V:  45  Cognitive Features That Contribute To Risk: limited insight, impulsivity.    Suicide Risk: Pt viewed as a chronic moderate increased risk of harm to self in light of his past hx and risk factors.  No acute safety concerns on the unit.  Pt contracting for safety and stable for discharge in am pending no acute change in mental status.  Plan Of Care/Follow-up recommendations: Pt seen and evaluated in treatment team. Chart reviewed.  Pt stable for and requesting discharge. Pt contracting for safety and does not currently meet [employer] involuntary commitment criteria for continued hospitalization against his will.  Mental health treatment, medication management and continued sobriety will mitigate against the increased risk of harm to self and/or others.  Discussed the importance of recovery further with pt, as well as, tools to move forward in a healthy & safe manner.  Pt agreeable with the plan.  Discussed with the team.  Please see orders, follow up appointments per AVS and full discharge summary to be completed by physician extender.  Recommend follow up with AA/NA.  Diet: Heart Healthy.  Activity: As tolerated.     Ryan Long 04/04/2012, 4:44 PM

## 2012-04-04 NOTE — Progress Notes (Signed)
Cosigned by Carney Bern, LCSWA 4/18/20133:37 PM

## 2012-04-04 NOTE — Discharge Planning (Signed)
Pt reported difficulties with phone interview with [facility] yesterday which he ultimately was unable to do. Phone interview has been rescheduled for [facility] in [place] for this evening at 8:30. If phone interview goes well pt will go to live there tomorrow at d/c.

## 2012-04-05 NOTE — Discharge Planning (Signed)
Pt not present in morning group. Met with him following group. States that he did not attend group because of chest pains that started last night. Pt states that he does not think his interview with the [facility] went well because he was not accepted to the house. He was asked by the house president to come to the house for a face to face interview which he plans to do today when he is d/c.

## 2012-04-05 NOTE — Treatment Plan (Signed)
Interdisciplinary Treatment Plan Update (Adult)  Date: 04/05/2012  Time Reviewed: 9:37 AM   Progress in Treatment: Attending groups: Yes Participating in groups: Yes Taking medication as prescribed: Yes Tolerating medication: Yes   Family/Significant othe contact made:   Patient understands diagnosis:  Yes Discussing patient identified problems/goals with staff:  Yes Medical problems stabilized or resolved:  Yes Denies suicidal/homicidal ideation: Yes  In tx team Issues/concerns per patient self-inventory:  No, not filled out Other:  New problem(s) identified: N/A  Reason for Continuation of Hospitalization: Other; describe D/C today  Interventions implemented related to continuation of hospitalization:   Additional comments:  Estimated length of stay:D/C today  Discharge Plan:[facility]  New goal(s): N/A  Review of initial/current patient goals per problem list:   1.  Goal(s):Identify comprehensive sobriety plan  Met:  Yes  Target date:4/17  As evidenced [id number], [facility]  2.  Goal (s): Get follow up appointments  Met:  Yes  Target date:4/17  As evidenced by: See d/c instructions  3.  Goal(s):  Met:  Yes  Target date:  As evidenced by:  4.  Goal(s):  Met:  Yes  Target date:  As evidenced by:  Attendees: Patient: Ryan Long  04/05/2012 9:37 AM  Family:     Physician:  Verne Spurr 04/05/2012 9:37 AM   Nursing:    04/05/2012 9:37 AM   Case Manager:  Richelle Ito, LCSW 04/05/2012 9:37 AM   Counselor:  Ronda Fairly, LCSWA 04/05/2012 9:37 AM   Other:     Other:     Other:     Other:      Scribe for Treatment Team:   Ida Rogue, 04/05/2012 9:37 AM

## 2012-04-05 NOTE — Progress Notes (Signed)
Discharge Note: Patient denied SI/HI and psychosis. Belongings returned. Discharge medications, prescriptions and AVS reviewed and given to patient. Patient also received bus tickets and money for [employer] to [place]. Patient forward thinking.

## 2012-04-05 NOTE — Progress Notes (Signed)
[facility] Case Management Discharge Plan:  Will you be returning to the same living situation after discharge: No. At discharge, do you have transportation home?:Yes,  bus pass, PART $ Do you have the ability to pay for your medications:Yes,  mental health, health serve  Interagency Information:     Release of information consent forms completed and in the chart;  Patient's signature needed at discharge.  Patient to Follow up at:  Follow-up Information    Follow up with [facility]. (You will need to call set up an appointment to see a Dr.  [name] would not take a referral from me)    Contact information:   [name]   [facility]  [phone]      Follow up with [facility] on 04/08/2012. (8 AM)    Contact information:   [address]  [name]  [phone]      Follow up with [facility].   Contact information:   [address]  [name]      Follow up with [facility].   Contact information:   [address]         Patient denies SI/HI:   Yes,  yes    Aeronautical engineer and Suicide Prevention discussed:  Yes,  yes  Barrier to discharge identified:No.  Summary and Recommendations:   Ida Rogue 04/05/2012, 10:44 AM

## 2012-04-08 ENCOUNTER — Ambulatory Visit (HOSPITAL_COMMUNITY): Payer: Medicaid Other | Attending: Internal Medicine

## 2012-04-08 ENCOUNTER — Ambulatory Visit: Payer: Self-pay | Admitting: Internal Medicine

## 2012-04-08 ENCOUNTER — Ambulatory Visit: Payer: Self-pay

## 2012-04-10 NOTE — Progress Notes (Signed)
Patient Discharge Instructions:  Psychiatric Admission Assessment Note Provided,  04/09/2012 After Visit Summary (AVS) Provided,  04/09/2012 Face Sheet Provided, 04/09/2012 Faxed/Sent to the Next Level Care provider:  04/09/2012 Provided Suicide Risk Assessment - Discharge Assessment 04/09/2012  Faxed to [facility] @ [phone]  Wandra Scot, 04/10/2012, 4:23 PM

## 2012-04-12 ENCOUNTER — Telehealth: Payer: Self-pay | Admitting: *Deleted

## 2012-04-12 ENCOUNTER — Ambulatory Visit: Payer: Self-pay | Admitting: Internal Medicine

## 2012-04-12 ENCOUNTER — Ambulatory Visit: Payer: Self-pay

## 2012-04-12 NOTE — Telephone Encounter (Signed)
Called patient and left voice mail, he no showed his appointment today. Wendall Mola CMA

## 2012-08-12 ENCOUNTER — Encounter: Payer: Self-pay | Admitting: *Deleted

## 2012-11-23 ENCOUNTER — Emergency Department (HOSPITAL_COMMUNITY)
Admission: EM | Admit: 2012-11-23 | Discharge: 2012-11-24 | Disposition: A | Discharge status: Home / Self Care | Payer: Medicaid Other | Attending: Emergency Medicine | Admitting: Emergency Medicine

## 2012-11-23 ENCOUNTER — Encounter (HOSPITAL_COMMUNITY): Payer: Self-pay | Admitting: Emergency Medicine

## 2012-11-23 DIAGNOSIS — E785 Hyperlipidemia, unspecified: Secondary | ICD-10-CM | POA: Insufficient documentation

## 2012-11-23 DIAGNOSIS — Z5900 Homelessness unspecified: Secondary | ICD-10-CM | POA: Insufficient documentation

## 2012-11-23 DIAGNOSIS — F39 Unspecified mood [affective] disorder: Secondary | ICD-10-CM | POA: Insufficient documentation

## 2012-11-23 DIAGNOSIS — Z7982 Long term (current) use of aspirin: Secondary | ICD-10-CM | POA: Insufficient documentation

## 2012-11-23 DIAGNOSIS — F329 Major depressive disorder, single episode, unspecified: Secondary | ICD-10-CM

## 2012-11-23 DIAGNOSIS — R45851 Suicidal ideations: Secondary | ICD-10-CM | POA: Insufficient documentation

## 2012-11-23 DIAGNOSIS — F32A Depression, unspecified: Secondary | ICD-10-CM

## 2012-11-23 DIAGNOSIS — I10 Essential (primary) hypertension: Secondary | ICD-10-CM | POA: Insufficient documentation

## 2012-11-23 DIAGNOSIS — I428 Other cardiomyopathies: Secondary | ICD-10-CM | POA: Insufficient documentation

## 2012-11-23 DIAGNOSIS — F319 Bipolar disorder, unspecified: Secondary | ICD-10-CM | POA: Insufficient documentation

## 2012-11-23 DIAGNOSIS — Z8679 Personal history of other diseases of the circulatory system: Secondary | ICD-10-CM | POA: Insufficient documentation

## 2012-11-23 DIAGNOSIS — I5022 Chronic systolic (congestive) heart failure: Secondary | ICD-10-CM | POA: Insufficient documentation

## 2012-11-23 DIAGNOSIS — Z79899 Other long term (current) drug therapy: Secondary | ICD-10-CM | POA: Insufficient documentation

## 2012-11-23 DIAGNOSIS — A6000 Herpesviral infection of urogenital system, unspecified: Secondary | ICD-10-CM | POA: Insufficient documentation

## 2012-11-23 DIAGNOSIS — F3289 Other specified depressive episodes: Secondary | ICD-10-CM | POA: Insufficient documentation

## 2012-11-23 DIAGNOSIS — F17200 Nicotine dependence, unspecified, uncomplicated: Secondary | ICD-10-CM | POA: Insufficient documentation

## 2012-11-23 DIAGNOSIS — Z8709 Personal history of other diseases of the respiratory system: Secondary | ICD-10-CM | POA: Insufficient documentation

## 2012-11-23 DIAGNOSIS — I509 Heart failure, unspecified: Secondary | ICD-10-CM | POA: Insufficient documentation

## 2012-11-23 DIAGNOSIS — B20 Human immunodeficiency virus [HIV] disease: Secondary | ICD-10-CM | POA: Insufficient documentation

## 2012-11-23 DIAGNOSIS — F1010 Alcohol abuse, uncomplicated: Secondary | ICD-10-CM | POA: Insufficient documentation

## 2012-11-23 LAB — CBC
MCH: 33 pg (ref 26.0–34.0)
MCHC: 36.5 g/dL — ABNORMAL HIGH (ref 30.0–36.0)
MCV: 90.4 fL (ref 78.0–100.0)
Platelets: 182 10*3/uL (ref 150–400)
RDW: 13.8 % (ref 11.5–15.5)

## 2012-11-23 LAB — COMPREHENSIVE METABOLIC PANEL
ALT: 31 U/L (ref 0–53)
AST: 31 U/L (ref 0–37)
Calcium: 8.8 mg/dL (ref 8.4–10.5)
Creatinine, Ser: 1.09 mg/dL (ref 0.50–1.35)
GFR calc non Af Amer: 77 mL/min — ABNORMAL LOW (ref >90)
Sodium: 140 mEq/L (ref 135–145)
Total Protein: 6.6 g/dL (ref 6.0–8.3)

## 2012-11-23 MED ORDER — HYDRALAZINE HCL 10 MG PO TABS
10.0000 mg | ORAL_TABLET | Freq: Two times a day (BID) | ORAL | Status: DC
  Filled 2012-11-23 (×4): qty 1

## 2012-11-23 MED ORDER — POTASSIUM CHLORIDE ER 10 MEQ PO TBCR
10.0000 meq | EXTENDED_RELEASE_TABLET | Freq: Every day | ORAL | Status: DC
  Filled 2012-11-23: qty 1

## 2012-11-23 MED ORDER — IBUPROFEN 600 MG PO TABS: 600.0000 mg | ORAL_TABLET | Freq: Three times a day (TID) | ORAL | Status: DC | PRN

## 2012-11-23 MED ORDER — ADULT MULTIVITAMIN W/MINERALS CH: 1.0000 | ORAL_TABLET | Freq: Every day | ORAL | Status: DC

## 2012-11-23 MED ORDER — ZOLPIDEM TARTRATE 5 MG PO TABS: 5.0000 mg | ORAL_TABLET | Freq: Every evening | ORAL | Status: DC | PRN

## 2012-11-23 MED ORDER — NICOTINE 21 MG/24HR TD PT24: 21.0000 mg | MEDICATED_PATCH | Freq: Every day | TRANSDERMAL | Status: DC

## 2012-11-23 MED ORDER — RALTEGRAVIR POTASSIUM 400 MG PO TABS
400.0000 mg | ORAL_TABLET | Freq: Two times a day (BID) | ORAL | Status: DC
  Filled 2012-11-23 (×4): qty 1

## 2012-11-23 MED ORDER — LISINOPRIL 5 MG PO TABS
5.0000 mg | ORAL_TABLET | Freq: Every day | ORAL | Status: DC
  Filled 2012-11-23: qty 1

## 2012-11-23 MED ORDER — EMTRICITABINE-TENOFOVIR DF 200-300 MG PO TABS
1.0000 | ORAL_TABLET | Freq: Every day | ORAL | Status: DC
  Filled 2012-11-23: qty 1

## 2012-11-23 MED ORDER — BUPROPION HCL ER (XL) 150 MG PO TB24
150.0000 mg | ORAL_TABLET | Freq: Every day | ORAL | Status: DC
  Filled 2012-11-23: qty 1

## 2012-11-23 MED ORDER — ASPIRIN 81 MG PO CHEW: 81.0000 mg | CHEWABLE_TABLET | Freq: Every day | ORAL | Status: DC

## 2012-11-23 MED ORDER — ONDANSETRON HCL 4 MG PO TABS: 4.0000 mg | ORAL_TABLET | Freq: Three times a day (TID) | ORAL | Status: DC | PRN

## 2012-11-23 MED ORDER — ACETAMINOPHEN 325 MG PO TABS: 650.0000 mg | ORAL_TABLET | Freq: Three times a day (TID) | ORAL | Status: DC | PRN

## 2012-11-23 MED ORDER — HYDROCHLOROTHIAZIDE 25 MG PO TABS
25.0000 mg | ORAL_TABLET | Freq: Every day | ORAL | Status: DC
  Filled 2012-11-23: qty 1

## 2012-11-23 MED ORDER — VALACYCLOVIR HCL 500 MG PO TABS
500.0000 mg | ORAL_TABLET | Freq: Two times a day (BID) | ORAL | Status: DC
  Filled 2012-11-23 (×4): qty 1

## 2012-11-23 MED ORDER — ALBUTEROL SULFATE HFA 108 (90 BASE) MCG/ACT IN AERS: 2.0000 | INHALATION_SPRAY | RESPIRATORY_TRACT | Status: DC | PRN

## 2012-11-23 MED ORDER — DIGOXIN 125 MCG PO TABS
125.0000 ug | ORAL_TABLET | Freq: Every day | ORAL | Status: DC
  Filled 2012-11-23: qty 1

## 2012-11-23 MED ORDER — FUROSEMIDE 40 MG PO TABS
40.0000 mg | ORAL_TABLET | Freq: Every day | ORAL | Status: DC
  Filled 2012-11-23: qty 1

## 2012-11-23 MED ORDER — CARVEDILOL 6.25 MG PO TABS
6.2500 mg | ORAL_TABLET | Freq: Two times a day (BID) | ORAL | Status: DC
  Administered 2012-11-24: 6.25 mg via ORAL
  Filled 2012-11-23 (×5): qty 1

## 2012-11-23 MED ORDER — LORAZEPAM 1 MG PO TABS: 1.0000 mg | ORAL_TABLET | Freq: Three times a day (TID) | ORAL | Status: DC | PRN

## 2012-11-23 MED ORDER — ALUM & MAG HYDROXIDE-SIMETH 200-200-20 MG/5ML PO SUSP: 30.0000 mL | ORAL | Status: DC | PRN

## 2012-11-23 MED ORDER — TRAZODONE HCL 50 MG PO TABS
150.0000 mg | ORAL_TABLET | Freq: Every day | ORAL | Status: DC
  Administered 2012-11-24: 150 mg via ORAL
  Filled 2012-11-23: qty 1

## 2012-11-23 MED ORDER — ISOSORBIDE DINITRATE 5 MG PO TABS
5.0000 mg | ORAL_TABLET | Freq: Every day | ORAL | Status: DC
  Filled 2012-11-23: qty 1

## 2012-11-23 NOTE — ED Notes (Signed)
Pt provided paper scrubs with instruction 

## 2012-11-23 NOTE — ED Notes (Signed)
Pt alert, arrives from home, c/lo SI, states "i will probably jump in front of a bus or something", resp even unlabored, skin pwd

## 2012-11-23 NOTE — ED Provider Notes (Signed)
History     CSN: [id number]  Arrival date & time 11/23/12  2131   First MD Initiated Contact with Patient 11/23/12 2256      Chief Complaint  Patient presents with  . Medical Clearance   HPI  History provided by the patient. Patient is a 51 year old male with history of HIV, hypertension, CHF, bipolar disorder with depression, alcohol and substance abuse who presents with complaints of increased depression and suicidal thoughts. Patient had been living in the [facility] area in a shelter but was released on the third of this month. He came to [facility] has been staying with a male friend. Today patient states that he was in disagreement with his friend and was no longer welcome to stay with her. Patient states he is now homeless and is depressed. Patient states he has had suicidal thoughts of walking into the Street or jumping in front of a bus. Denies having past suicidal attempts. Patient denies any HI or hallucinations. He has no other complaints for request help with his increased depression. Patient states he has been taking all his medications regularly as prescribed. Denies any fever, chills or sweats. Denies any recent cough, chest pain or shortness of breath symptoms. No episodes of vomiting or diarrhea.   Past Medical History  Diagnosis Date  . HIV (human immunodeficiency virus infection)   . Chronic systolic heart failure   . Hypertension   . Genital herpes   . Active smoker   . AR (allergic rhinitis)   . HLD (hyperlipidemia)   . NICM (nonischemic cardiomyopathy)     Echocardiogram 06/28/11: EF 30-35%, mild MR, mild LAE;  No CAD by coronary CT angiogram 3/12 at [facility]  . NSVT (nonsustained ventricular tachycardia)   . Depression   . Bipolar 1 disorder   . Alcohol abuse   . Crack cocaine use     History reviewed. No pertinent past surgical history.  No family history on file.  History  Substance Use Topics  . Smoking status: Current Every  Day Smoker -- 0.8 packs/day for 30 years    Types: Cigarettes  . Smokeless tobacco: Never Used  . Alcohol Use: Yes     Comment: 4 to 5 beers daily and 2 5ths of wine      Review of Systems  Constitutional: Negative for fever, chills and diaphoresis.  Respiratory: Negative for cough and shortness of breath.   Cardiovascular: Negative for chest pain and leg swelling.  Gastrointestinal: Negative for vomiting and diarrhea.  Psychiatric/Behavioral: Positive for suicidal ideas and dysphoric mood. Negative for hallucinations and self-injury.  All other systems reviewed and are negative.    Allergies  Review of patient's allergies indicates no known allergies.  Home Medications   Current Outpatient Rx  Name  Route  Sig  Dispense  Refill  . ACETAMINOPHEN 325 MG PO TABS   Oral   Take 650 mg by mouth every 8 (eight) hours as needed. For pain/fever         . ALBUTEROL SULFATE HFA 108 (90 BASE) MCG/ACT IN AERS   Inhalation   Inhale 2 puffs into the lungs as needed for shortness of breath. Per pt med is as needed with no frequency for asthma.   1 Inhaler   0   . ASPIRIN 81 MG PO TABS   Oral   Take 1 tablet (81 mg total) by mouth daily. For decreased platelet aggregation.   30 tablet   0   . BUPROPION HCL  ER (XL) 150 MG PO TB24   Oral   Take 1 tablet (150 mg total) by mouth daily. For depression.   30 tablet   0   . CARVEDILOL 6.25 MG PO TABS   Oral   Take 1 tablet (6.25 mg total) by mouth 2 (two) times daily with a meal. For hypertension.         Marland Kitchen DIGOXIN 0.125 MG PO TABS   Oral   Take 1 tablet (125 mcg total) by mouth daily. For control of heart arrythmia.         Marland Kitchen EMTRICITABINE-TENOFOVIR 200-300 MG PO TABS   Oral   Take 1 tablet by mouth daily. For HIV         . FUROSEMIDE 40 MG PO TABS   Oral   Take 1 tablet (40 mg total) by mouth daily. For edema   30 tablet   0   . HYDRALAZINE HCL 10 MG PO TABS   Oral   Take 10 mg by mouth 2 (two) times daily.          Marland Kitchen HYDROCHLOROTHIAZIDE 25 MG PO TABS   Oral   Take 25 mg by mouth daily.         . ISOSORBIDE DINITRATE 10 MG PO TABS   Oral   Take 5 mg by mouth daily.         Marland Kitchen LISINOPRIL 5 MG PO TABS   Oral   Take 1 tablet (5 mg total) by mouth daily. For hypertension         . ONE-DAILY MULTI VITAMINS PO TABS   Oral   Take 1 tablet by mouth daily. For vitamin deficiency         . POTASSIUM CHLORIDE ER 10 MEQ PO TBCR   Oral   Take 10 mEq by mouth daily.         Marland Kitchen RALTEGRAVIR POTASSIUM 400 MG PO TABS   Oral   Take 1 tablet (400 mg total) by mouth 2 (two) times daily. For HIV         . TRAZODONE HCL 150 MG PO TABS   Oral   Take 1 tablet (150 mg total) by mouth at bedtime. For sleep.         Marland Kitchen VALACYCLOVIR HCL 500 MG PO TABS   Oral   Take 1 tablet (500 mg total) by mouth 2 (two) times daily. For Herpes.           BP 111/73  Pulse 95  Temp 98.2 F (36.8 C) (Oral)  Resp 16  Ht 5\' 9"  (1.753 m)  Wt 185 lb (83.915 kg)  BMI 27.32 kg/m2  SpO2 99%  Physical Exam  Nursing note and vitals reviewed. Constitutional: He appears well-developed and well-nourished.  HENT:  Head: Normocephalic.  Cardiovascular: Normal rate and regular rhythm.   Pulmonary/Chest: Effort normal and breath sounds normal.  Abdominal: Soft.  Psychiatric: He is not actively hallucinating. He exhibits a depressed mood. He expresses suicidal ideation. He expresses no homicidal ideation. He expresses suicidal plans.    ED Course  Procedures   Results for orders placed during the hospital encounter of 11/23/12  CBC      Component Value Range   WBC 3.8 (*) 4.0 - 10.5 K/uL   RBC 5.33  4.22 - 5.81 MIL/uL   Hemoglobin 17.6 (*) 13.0 - 17.0 g/dL   HCT 40.9  81.1 - 91.4 %   MCV 90.4  78.0 - 100.0 fL  MCH 33.0  26.0 - 34.0 pg   MCHC 36.5 (*) 30.0 - 36.0 g/dL   RDW 16.1  09.6 - 04.5 %   Platelets 182  150 - 400 K/uL  COMPREHENSIVE METABOLIC PANEL      Component Value Range   Sodium 140   135 - 145 mEq/L   Potassium 3.5  3.5 - 5.1 mEq/L   Chloride 105  96 - 112 mEq/L   CO2 26  19 - 32 mEq/L   Glucose, Bld 96  70 - 99 mg/dL   BUN 8  6 - 23 mg/dL   Creatinine, Ser 4.09  0.50 - 1.35 mg/dL   Calcium 8.8  8.4 - 81.1 mg/dL   Total Protein 6.6  6.0 - 8.3 g/dL   Albumin 3.6  3.5 - 5.2 g/dL   AST 31  0 - 37 U/L   ALT 31  0 - 53 U/L   Alkaline Phosphatase 55  39 - 117 U/L   Total Bilirubin 0.9  0.3 - 1.2 mg/dL   GFR calc non Af Amer 77 (*) >90 mL/min   GFR calc Af Amer 89 (*) >90 mL/min  ETHANOL      Component Value Range   Alcohol, Ethyl (B) <11  0 - 11 mg/dL      1. Depression   2. Suicidal ideation       MDM  11:00 p.m. patient seen and evaluated. Patient sitting comfortably and calm.  Patient's increased depression and SI seems situational with recent homelessness. Patient will be moved to psych ED with tele-psych consult.  Patient was seen and evaluated by to psych psychiatrist. Patient was deemed stable and safe for discharge. Patient is homeless at the time without current followup for medical conditions. Will have social work consult in the a.m.  Pt discussed in sign out with Remi Haggard NP.  She will follow up on Social work consult.   Angus Seller, [place] 11/24/12 [phone]

## 2012-11-24 LAB — RAPID URINE DRUG SCREEN, HOSP PERFORMED
Barbiturates: NOT DETECTED
Benzodiazepines: NOT DETECTED
Cocaine: POSITIVE — AB
Opiates: NOT DETECTED
Tetrahydrocannabinol: POSITIVE — AB

## 2012-11-24 NOTE — ED Notes (Signed)
Up to the bathroom 

## 2012-11-24 NOTE — ED Provider Notes (Signed)
0600 report received from [place] PA for this 51 year old male coming in to the ER with suicidal ideations but will be discharged after his social worker  consult. Patient is waiting to speak with the social worker for resources on place to live because he is homeless. Originally from [place]. Will consult social work after 8:00 this morning. 1000am Patient discharged by nursing staff.  He will be transported  to [facility] up by [employer] to sleep for the night.     Remi Haggard, NP 11/24/12 1826

## 2012-11-24 NOTE — ED Notes (Signed)
CSW met with pt by bedside and provided information for community resources. CSW telephoned [facility] to determine if pt could be reside there tonight. Staff informed CSW that there are 2 beds availble right now and that pt must get there quickly in order to secure that bed. Staff member took pt's name, date of birth, and last 4 of social for records. CSW will provide transportation assistance to aid pt in securing placement at shelter.   Ryan Long., MSW, [facility] Weekend ED Clinical Social Worker [phone]

## 2012-11-24 NOTE — ED Notes (Signed)
2 patient belongings bags placed in locker 41. 1 large suitcase placed at lockers.

## 2012-11-24 NOTE — ED Notes (Signed)
Pt just laid back down in his bed. He got upset earlier because we didn't get him a coke-cola fast enough and that's just how black folk do. He asked to speak to supervisor, supervisor notified.

## 2012-11-24 NOTE — ED Notes (Signed)
1 large suitcase in [facility] area.

## 2012-11-24 NOTE — ED Notes (Signed)
Pt reports he took his evening/night time medications already tonight. Pt reports he does not abuse ETOH or crack on a regular basis and isn't interested in detox. He wants treatment for depression and +SI. He said his MD said he can't work right now d/t having CHF last year and he's applied for disability so money is tight and that's caused problems with him and his girlfriend so she broke up with him which is adding to his depression.

## 2012-11-25 NOTE — ED Provider Notes (Signed)
Medical screening examination/treatment/procedure(s) were performed by non-physician practitioner and as supervising physician I was immediately available for consultation/collaboration.   Dvontae Ruan, MD 11/25/12 0141 

## 2012-11-25 NOTE — ED Provider Notes (Signed)
Medical screening examination/treatment/procedure(s) were performed by non-physician practitioner and as supervising physician I was immediately available for consultation/collaboration.   Noboru Bidinger, MD 11/25/12 0141 

## 2013-02-17 DIAGNOSIS — F17200 Nicotine dependence, unspecified, uncomplicated: Secondary | ICD-10-CM | POA: Diagnosis present

## 2013-03-26 ENCOUNTER — Encounter: Payer: Self-pay | Admitting: *Deleted

## 2013-11-21 ENCOUNTER — Emergency Department (HOSPITAL_COMMUNITY)
Admission: EM | Admit: 2013-11-21 | Discharge: 2013-11-22 | Disposition: A | Discharge status: Other Acute Inpatient Hospital | Payer: Self-pay | Attending: Psychiatry | Admitting: Psychiatry

## 2013-11-21 ENCOUNTER — Encounter (HOSPITAL_COMMUNITY): Payer: Self-pay | Admitting: Emergency Medicine

## 2013-11-21 DIAGNOSIS — G479 Sleep disorder, unspecified: Secondary | ICD-10-CM | POA: Insufficient documentation

## 2013-11-21 DIAGNOSIS — F17200 Nicotine dependence, unspecified, uncomplicated: Secondary | ICD-10-CM | POA: Insufficient documentation

## 2013-11-21 DIAGNOSIS — Z8639 Personal history of other endocrine, nutritional and metabolic disease: Secondary | ICD-10-CM | POA: Insufficient documentation

## 2013-11-21 DIAGNOSIS — F1210 Cannabis abuse, uncomplicated: Secondary | ICD-10-CM | POA: Insufficient documentation

## 2013-11-21 DIAGNOSIS — Z8619 Personal history of other infectious and parasitic diseases: Secondary | ICD-10-CM | POA: Insufficient documentation

## 2013-11-21 DIAGNOSIS — F1410 Cocaine abuse, uncomplicated: Secondary | ICD-10-CM | POA: Insufficient documentation

## 2013-11-21 DIAGNOSIS — Z862 Personal history of diseases of the blood and blood-forming organs and certain disorders involving the immune mechanism: Secondary | ICD-10-CM | POA: Insufficient documentation

## 2013-11-21 DIAGNOSIS — R002 Palpitations: Secondary | ICD-10-CM | POA: Insufficient documentation

## 2013-11-21 DIAGNOSIS — M940 Chondrocostal junction syndrome [Tietze]: Secondary | ICD-10-CM | POA: Insufficient documentation

## 2013-11-21 DIAGNOSIS — F329 Major depressive disorder, single episode, unspecified: Secondary | ICD-10-CM | POA: Insufficient documentation

## 2013-11-21 DIAGNOSIS — Z21 Asymptomatic human immunodeficiency virus [HIV] infection status: Secondary | ICD-10-CM | POA: Insufficient documentation

## 2013-11-21 DIAGNOSIS — I10 Essential (primary) hypertension: Secondary | ICD-10-CM | POA: Insufficient documentation

## 2013-11-21 DIAGNOSIS — F3289 Other specified depressive episodes: Secondary | ICD-10-CM | POA: Insufficient documentation

## 2013-11-21 DIAGNOSIS — R45851 Suicidal ideations: Secondary | ICD-10-CM | POA: Insufficient documentation

## 2013-11-21 DIAGNOSIS — J441 Chronic obstructive pulmonary disease with (acute) exacerbation: Secondary | ICD-10-CM | POA: Insufficient documentation

## 2013-11-21 DIAGNOSIS — I5022 Chronic systolic (congestive) heart failure: Secondary | ICD-10-CM | POA: Insufficient documentation

## 2013-11-21 DIAGNOSIS — F32A Depression, unspecified: Secondary | ICD-10-CM

## 2013-11-21 LAB — URINALYSIS, ROUTINE W REFLEX MICROSCOPIC
Ketones, ur: NEGATIVE mg/dL
Leukocytes, UA: NEGATIVE
Nitrite: NEGATIVE
Specific Gravity, Urine: 1.01 (ref 1.005–1.030)
pH: 6 (ref 5.0–8.0)

## 2013-11-21 LAB — COMPREHENSIVE METABOLIC PANEL
ALT: 83 U/L — ABNORMAL HIGH (ref 0–53)
Alkaline Phosphatase: 59 U/L (ref 39–117)
CO2: 23 mEq/L (ref 19–32)
GFR calc Af Amer: >90 mL/min (ref >90)
GFR calc non Af Amer: 85 mL/min — ABNORMAL LOW (ref >90)
Glucose, Bld: 92 mg/dL (ref 70–99)
Potassium: 3.6 mEq/L (ref 3.5–5.1)
Sodium: 139 mEq/L (ref 135–145)

## 2013-11-21 LAB — RAPID URINE DRUG SCREEN, HOSP PERFORMED: Barbiturates: NOT DETECTED

## 2013-11-21 LAB — SALICYLATE LEVEL: Salicylate Lvl: <2 mg/dL — ABNORMAL LOW (ref 2.8–20.0)

## 2013-11-21 LAB — POCT I-STAT TROPONIN I: Troponin i, poc: 0.01 ng/mL (ref 0.00–0.08)

## 2013-11-21 LAB — CBC
Hemoglobin: 16.1 g/dL (ref 13.0–17.0)
RBC: 4.67 MIL/uL (ref 4.22–5.81)

## 2013-11-21 MED ORDER — NAPROXEN 250 MG PO TABS
500.0000 mg | ORAL_TABLET | Freq: Once | ORAL | Status: AC
  Administered 2013-11-21: 500 mg via ORAL
  Filled 2013-11-21: qty 2

## 2013-11-21 MED ORDER — ALBUTEROL SULFATE (5 MG/ML) 0.5% IN NEBU
5.0000 mg | INHALATION_SOLUTION | RESPIRATORY_TRACT | Status: DC
  Administered 2013-11-21: 5 mg via RESPIRATORY_TRACT
  Filled 2013-11-21: qty 1

## 2013-11-21 MED ORDER — IPRATROPIUM BROMIDE 0.02 % IN SOLN
0.5000 mg | RESPIRATORY_TRACT | Status: DC
  Administered 2013-11-21: 0.5 mg via RESPIRATORY_TRACT
  Filled 2013-11-21: qty 2.5

## 2013-11-21 NOTE — ED Notes (Signed)
There will be a sitter available for the pt when one of the pts in Pod C is transferred.

## 2013-11-21 NOTE — ED Provider Notes (Addendum)
52 year old male with a history of a dilated cardiomyopathy likely secondary to his HIV history according to cardiology notes. He has a known history of congestive heart failure with an ejection fraction of 35% but nonobstructive coronary disease according to an angiogram performed 2 years ago. He presents with recurrent chest pain after using 4 straight days of smoking crack cocaine. He states that he was recently put on onto the street in his own words, he was evicted from his home in [place], is feeling more depressed, suicidal and states that he has a plantar walking from a car on the street. He has been seen in the emergency department in the past for both chest pain and for his psychiatric complaints. At this time his heart and lungs are clear other than mild expiratory wheezing, there is no signs of acute ischemia on his EKG infected is improved compared to prior EKGs where there was T wave inversions.  While the patient is here in the emergency department he will need several sets of cardiac enzymes, I have discussed his care with the behavioral health assessment team who will see him in regards to his psychiatric complaints as well.  Change of shift - care signed out to Dr. Bebe Shaggy - pt appears HD stable at this time.   Medical screening examination/treatment/procedure(s) were conducted as a shared visit with non-physician practitioner(s) and myself.  I personally evaluated the patient during the encounter.     [name], MD 11/21/13 6578  [name], MD 11/22/13 4696  [name], MD 11/22/13 [phone]

## 2013-11-21 NOTE — ED Notes (Signed)
Pt BIB EMS. Pt states that he has been having chest pain since about noon today. Pt denies SOB. Pt also reports having suicidal thoughts with a plan to harm himself. Pt states that he was thinking about walking out in front of a car.

## 2013-11-21 NOTE — ED Provider Notes (Signed)
CSN: [id number]     Arrival date & time 11/21/13  2146 History   First MD Initiated Contact with Patient 11/21/13 2155     Chief Complaint  Patient presents with  . Chest Pain  . Suicidal   (Consider location/radiation/quality/duration/timing/severity/associated sxs/prior Treatment) Patient is a 52 y.o. male presenting with chest pain.  Chest Pain Associated symptoms: cough and palpitations   Associated symptoms: no abdominal pain, no dizziness, no fever, no headache, no nausea, no shortness of breath and not vomiting    52 yo male with hx of HIV, HTN, CHF, and recent Crack cocaine use, presents with Chest pain that started around 2 pm today. Pain rated as 4/10 sharp in nature and worse with inspiration. Pain is constant. Patient denies radiation to neck, jaw, or shoulder. Denies shortness of breath. Patient also mentions being kicked out of his home on December 1st. Has since been depressed and has taken to alcohol as well as crack cocaine x 4 days. Patient admits to [facility] with plan to jump in front of car. Denies HI. Admits to feelings of hopelessness. PMH significant for Bipolar disorder and depression.   Past Medical History  Diagnosis Date  . HIV (human immunodeficiency virus infection)   . Chronic systolic heart failure   . Hypertension   . Genital herpes   . Active smoker   . AR (allergic rhinitis)   . HLD (hyperlipidemia)   . NICM (nonischemic cardiomyopathy)     Echocardiogram 06/28/11: EF 30-35%, mild MR, mild LAE;  No CAD by coronary CT angiogram 3/12 at [facility]  . NSVT (nonsustained ventricular tachycardia)   . Depression   . Bipolar 1 disorder   . Alcohol abuse   . Crack cocaine use   . COPD (chronic obstructive pulmonary disease)   . CHF (congestive heart failure)    History reviewed. No pertinent past surgical history. History reviewed. No pertinent family history. History  Substance Use Topics  . Smoking status: Current Every Day Smoker -- 0.80  packs/day for 30 years    Types: Cigarettes  . Smokeless tobacco: Never Used  . Alcohol Use: Yes     Comment: 4 to 5 beers daily and 2 5ths of wine    Review of Systems  Constitutional: Negative for fever and chills.  HENT: Negative for congestion.   Respiratory: Positive for cough. Negative for shortness of breath.   Cardiovascular: Positive for chest pain and palpitations.  Gastrointestinal: Negative for nausea, vomiting and abdominal pain.  Musculoskeletal: Negative for neck pain and neck stiffness.  Skin: Negative for rash.  Allergic/Immunologic: Negative for immunocompromised state.  Neurological: Negative for dizziness and headaches.  Psychiatric/Behavioral: Positive for suicidal ideas, sleep disturbance and decreased concentration.    Allergies  Review of patient's allergies indicates no known allergies.  Home Medications   No current outpatient prescriptions on file. BP 115/72  Pulse 69  Temp(Src) 97.5 F (36.4 C) (Oral)  Resp 18  SpO2 98% Physical Exam  Vitals reviewed. Constitutional: He is oriented to person, place, and time. He appears well-developed and well-nourished. No distress.  HENT:  Head: Normocephalic and atraumatic.  Eyes: EOM are normal. Pupils are equal, round, and reactive to light.  Neck: Normal range of motion. Neck supple. No JVD present.  Cardiovascular: Normal rate and regular rhythm.   Pulmonary/Chest: Effort normal. No respiratory distress. He has wheezes (Diffuse inspiratory/expiratory wheezing). He has no rales. He exhibits tenderness (chest pain reproducible on exam with compression of sternum and resisted abduction  of arms. ).  Musculoskeletal: Normal range of motion. He exhibits no edema.  Neurological: He is alert and oriented to person, place, and time.  Skin: Skin is warm and dry. He is not diaphoretic.  Psychiatric: He has a normal mood and affect. His behavior is normal.    ED Course  Procedures (including critical care  time) Labs Review Labs Reviewed  CBC - Abnormal; Notable for the following:    WBC 3.0 (*)    MCH 34.5 (*)    MCHC 37.0 (*)    All other components within normal limits  COMPREHENSIVE METABOLIC PANEL - Abnormal; Notable for the following:    AST 81 (*)    ALT 83 (*)    GFR calc non Af Amer 85 (*)    All other components within normal limits  URINE RAPID DRUG SCREEN (HOSP PERFORMED) - Abnormal; Notable for the following:    Cocaine POSITIVE (*)    All other components within normal limits  SALICYLATE LEVEL - Abnormal; Notable for the following:    Salicylate Lvl <2.0 (*)    All other components within normal limits  ETHANOL  URINALYSIS, ROUTINE W REFLEX MICROSCOPIC  ACETAMINOPHEN LEVEL  POCT I-STAT TROPONIN I  POCT I-STAT TROPONIN I   Imaging Review Dg Chest 2 View  11/22/2013   CLINICAL DATA:  Chest pain for 12 hours.  EXAM: CHEST  2 VIEW  COMPARISON:  Chest radiograph performed 10/16/2011  FINDINGS: The lungs are well-aerated and clear. There is no evidence of focal opacification, pleural effusion or pneumothorax.  The heart is normal in size; the mediastinal contour is within normal limits. No acute osseous abnormalities are seen.  IMPRESSION: No active cardiopulmonary disease.   Electronically Signed   By: Roanna Raider M.D.   On: 11/22/2013 00:50    EKG Interpretation    Date/Time:  Friday November 21 2013 22:07:41 EST Ventricular Rate:  81 PR Interval:  217 QRS Duration: 79 QT Interval:  386 QTC Calculation: 448 R Axis:   25 Text Interpretation:  Age not entered, assumed to be  52 years old for purpose of ECG interpretation Sinus rhythm Prolonged PR interval Anterior infarct, old Abnormal T, consider ischemia, lateral leads Since last tracing T wave inversion in inferior leads has resolved Confirmed by MILLER  MD, BRIAN ([id number]) on 11/21/2013 10:43:38 PM            MDM   1. Depression   2. Suicidal ideation   3. Costochondritis   4. Palpitations    EKG shows  no acute findings, appears improved from previous studies. Troponin Negative. CXR shows no acute abnormalities. Leukopenia present, patient immunocompromised, Last CD4 count WNL in 2013 . Transaminases elevated, patient has recent hx of alcohol and drug abuse. Drug screen positive for Cocaine. Acetaminiophen and Salicylate levels normal. All other labs WNL.  Patient's wheezing improved following treatment with duoneb. Patient denies dyspnea. Reproducible chest pain on exam and hx of cough, suspect Costochondritis. Pain treated in ED.   Patient discussed with Dr. Eber Hong. Patient admitted for observation. Patient currently awaiting Psych evaluation.   Meds given in ED:  Medications  naproxen (NAPROSYN) tablet 500 mg (500 mg Oral Given 11/21/13 2305)    Discharge Medication List as of 11/22/2013  6:48 AM         Rudene Anda, PA-C 11/22/13 1126

## 2013-11-21 NOTE — ED Notes (Signed)
Miller, MD at bedside.  

## 2013-11-21 NOTE — ED Notes (Signed)
Pt reports last use of cocaine, alcohol and marijuana yesterday.

## 2013-11-21 NOTE — ED Notes (Signed)
Lackey, PA at bedside.  

## 2013-11-22 ENCOUNTER — Inpatient Hospital Stay (HOSPITAL_COMMUNITY)
Admission: AD | Admit: 2013-11-22 | Discharge: 2013-11-28 | DRG: 885 | Disposition: A | Discharge status: Home / Self Care | Payer: Medicaid Other | Source: Intra-hospital | Attending: Psychiatry | Admitting: Psychiatry

## 2013-11-22 ENCOUNTER — Emergency Department (HOSPITAL_COMMUNITY): Payer: Self-pay

## 2013-11-22 ENCOUNTER — Encounter (HOSPITAL_COMMUNITY): Payer: Self-pay | Admitting: *Deleted

## 2013-11-22 DIAGNOSIS — F322 Major depressive disorder, single episode, severe without psychotic features: Principal | ICD-10-CM | POA: Diagnosis present

## 2013-11-22 DIAGNOSIS — J449 Chronic obstructive pulmonary disease, unspecified: Secondary | ICD-10-CM | POA: Diagnosis present

## 2013-11-22 DIAGNOSIS — F329 Major depressive disorder, single episode, unspecified: Secondary | ICD-10-CM | POA: Diagnosis present

## 2013-11-22 DIAGNOSIS — F1020 Alcohol dependence, uncomplicated: Secondary | ICD-10-CM | POA: Diagnosis present

## 2013-11-22 DIAGNOSIS — Z79899 Other long term (current) drug therapy: Secondary | ICD-10-CM

## 2013-11-22 DIAGNOSIS — I5022 Chronic systolic (congestive) heart failure: Secondary | ICD-10-CM | POA: Diagnosis present

## 2013-11-22 DIAGNOSIS — I10 Essential (primary) hypertension: Secondary | ICD-10-CM | POA: Diagnosis present

## 2013-11-22 DIAGNOSIS — F1010 Alcohol abuse, uncomplicated: Secondary | ICD-10-CM | POA: Diagnosis present

## 2013-11-22 DIAGNOSIS — I517 Cardiomegaly: Secondary | ICD-10-CM

## 2013-11-22 DIAGNOSIS — E785 Hyperlipidemia, unspecified: Secondary | ICD-10-CM

## 2013-11-22 DIAGNOSIS — F319 Bipolar disorder, unspecified: Secondary | ICD-10-CM | POA: Diagnosis present

## 2013-11-22 DIAGNOSIS — J4489 Other specified chronic obstructive pulmonary disease: Secondary | ICD-10-CM | POA: Diagnosis present

## 2013-11-22 DIAGNOSIS — J309 Allergic rhinitis, unspecified: Secondary | ICD-10-CM

## 2013-11-22 DIAGNOSIS — F1410 Cocaine abuse, uncomplicated: Secondary | ICD-10-CM | POA: Diagnosis present

## 2013-11-22 DIAGNOSIS — I428 Other cardiomyopathies: Secondary | ICD-10-CM | POA: Diagnosis present

## 2013-11-22 DIAGNOSIS — B20 Human immunodeficiency virus [HIV] disease: Secondary | ICD-10-CM | POA: Diagnosis present

## 2013-11-22 DIAGNOSIS — B009 Herpesviral infection, unspecified: Secondary | ICD-10-CM

## 2013-11-22 DIAGNOSIS — I509 Heart failure, unspecified: Secondary | ICD-10-CM | POA: Diagnosis present

## 2013-11-22 DIAGNOSIS — F1994 Other psychoactive substance use, unspecified with psychoactive substance-induced mood disorder: Secondary | ICD-10-CM

## 2013-11-22 DIAGNOSIS — F1910 Other psychoactive substance abuse, uncomplicated: Secondary | ICD-10-CM

## 2013-11-22 HISTORY — DX: Chronic obstructive pulmonary disease, unspecified: J44.9

## 2013-11-22 LAB — BASIC METABOLIC PANEL
BUN: 17 mg/dL (ref 6–23)
CO2: 22 mEq/L (ref 19–32)
Chloride: 105 mEq/L (ref 96–112)
GFR calc Af Amer: >90 mL/min (ref >90)
GFR calc non Af Amer: 83 mL/min — ABNORMAL LOW (ref >90)
Glucose, Bld: 97 mg/dL (ref 70–99)
Potassium: 3.8 mEq/L (ref 3.5–5.1)

## 2013-11-22 MED ORDER — VALACYCLOVIR HCL 500 MG PO TABS
500.0000 mg | ORAL_TABLET | Freq: Every day | ORAL | Status: DC
  Administered 2013-11-23 – 2013-11-28 (×6): 500 mg via ORAL
  Filled 2013-11-22 (×9): qty 1

## 2013-11-22 MED ORDER — ACETAMINOPHEN 325 MG PO TABS: 650.0000 mg | ORAL_TABLET | ORAL | Status: DC | PRN

## 2013-11-22 MED ORDER — LORAZEPAM 1 MG PO TABS: 0.0000 mg | ORAL_TABLET | Freq: Four times a day (QID) | ORAL | Status: DC

## 2013-11-22 MED ORDER — ISOSORBIDE DINITRATE 5 MG PO TABS
5.0000 mg | ORAL_TABLET | Freq: Two times a day (BID) | ORAL | Status: DC
  Filled 2013-11-22 (×4): qty 1

## 2013-11-22 MED ORDER — DIGOXIN 125 MCG PO TABS
0.1250 mg | ORAL_TABLET | Freq: Every day | ORAL | Status: DC
  Administered 2013-11-23 – 2013-11-28 (×6): 0.125 mg via ORAL
  Filled 2013-11-22 (×9): qty 1

## 2013-11-22 MED ORDER — ONDANSETRON HCL 4 MG PO TABS: 4.0000 mg | ORAL_TABLET | Freq: Three times a day (TID) | ORAL | Status: DC | PRN

## 2013-11-22 MED ORDER — EMTRICITABINE-TENOFOVIR DF 200-300 MG PO TABS
1.0000 | ORAL_TABLET | Freq: Every day | ORAL | Status: DC
  Administered 2013-11-23 – 2013-11-28 (×6): 1 via ORAL
  Filled 2013-11-22 (×10): qty 1

## 2013-11-22 MED ORDER — ASPIRIN EC 81 MG PO TBEC
81.0000 mg | DELAYED_RELEASE_TABLET | Freq: Every day | ORAL | Status: DC
  Administered 2013-11-23 – 2013-11-28 (×6): 81 mg via ORAL
  Filled 2013-11-22 (×9): qty 1

## 2013-11-22 MED ORDER — ACETAMINOPHEN 325 MG PO TABS
650.0000 mg | ORAL_TABLET | Freq: Four times a day (QID) | ORAL | Status: DC | PRN
  Administered 2013-11-25: 650 mg via ORAL
  Filled 2013-11-22: qty 2

## 2013-11-22 MED ORDER — METOPROLOL SUCCINATE ER 100 MG PO TB24
100.0000 mg | ORAL_TABLET | Freq: Every day | ORAL | Status: DC
  Filled 2013-11-22 (×3): qty 1

## 2013-11-22 MED ORDER — SERTRALINE HCL 25 MG PO TABS
25.0000 mg | ORAL_TABLET | Freq: Every day | ORAL | Status: DC
  Filled 2013-11-22 (×3): qty 1

## 2013-11-22 MED ORDER — ALUM & MAG HYDROXIDE-SIMETH 200-200-20 MG/5ML PO SUSP: 30.0000 mL | ORAL | Status: DC | PRN

## 2013-11-22 MED ORDER — THIAMINE HCL 100 MG/ML IJ SOLN: 100.0000 mg | Freq: Every day | INTRAMUSCULAR | Status: DC

## 2013-11-22 MED ORDER — LORAZEPAM 1 MG PO TABS: 1.0000 mg | ORAL_TABLET | Freq: Three times a day (TID) | ORAL | Status: DC | PRN

## 2013-11-22 MED ORDER — NICOTINE 21 MG/24HR TD PT24: 21.0000 mg | MEDICATED_PATCH | Freq: Every day | TRANSDERMAL | Status: DC

## 2013-11-22 MED ORDER — RALTEGRAVIR POTASSIUM 400 MG PO TABS
400.0000 mg | ORAL_TABLET | Freq: Two times a day (BID) | ORAL | Status: DC
  Administered 2013-11-23 – 2013-11-28 (×11): 400 mg via ORAL
  Filled 2013-11-22 (×17): qty 1

## 2013-11-22 MED ORDER — HYDRALAZINE HCL 10 MG PO TABS
10.0000 mg | ORAL_TABLET | Freq: Two times a day (BID) | ORAL | Status: DC
  Filled 2013-11-22 (×2): qty 1

## 2013-11-22 MED ORDER — HYDRALAZINE HCL 10 MG PO TABS
10.0000 mg | ORAL_TABLET | Freq: Two times a day (BID) | ORAL | Status: DC
  Filled 2013-11-22 (×3): qty 1

## 2013-11-22 MED ORDER — VITAMIN B-1 100 MG PO TABS: 100.0000 mg | ORAL_TABLET | Freq: Every day | ORAL | Status: DC

## 2013-11-22 MED ORDER — ISOSORBIDE DINITRATE 5 MG PO TABS
5.0000 mg | ORAL_TABLET | Freq: Every day | ORAL | Status: DC
  Administered 2013-11-23 – 2013-11-28 (×6): 5 mg via ORAL
  Filled 2013-11-22 (×8): qty 1

## 2013-11-22 MED ORDER — ALBUTEROL SULFATE HFA 108 (90 BASE) MCG/ACT IN AERS: 2.0000 | INHALATION_SPRAY | RESPIRATORY_TRACT | Status: DC | PRN

## 2013-11-22 MED ORDER — TRAZODONE HCL 100 MG PO TABS
100.0000 mg | ORAL_TABLET | Freq: Every day | ORAL | Status: DC
  Administered 2013-11-24: 100 mg via ORAL
  Filled 2013-11-22 (×4): qty 1

## 2013-11-22 MED ORDER — IBUPROFEN 200 MG PO TABS: 600.0000 mg | ORAL_TABLET | Freq: Three times a day (TID) | ORAL | Status: DC | PRN

## 2013-11-22 MED ORDER — LORAZEPAM 1 MG PO TABS: 0.0000 mg | ORAL_TABLET | Freq: Two times a day (BID) | ORAL | Status: DC

## 2013-11-22 MED ORDER — FUROSEMIDE 40 MG PO TABS
40.0000 mg | ORAL_TABLET | Freq: Every day | ORAL | Status: DC
  Administered 2013-11-23 – 2013-11-28 (×6): 40 mg via ORAL
  Filled 2013-11-22 (×9): qty 1

## 2013-11-22 MED ORDER — POTASSIUM CHLORIDE CRYS ER 20 MEQ PO TBCR
20.0000 meq | EXTENDED_RELEASE_TABLET | Freq: Every day | ORAL | Status: DC
  Administered 2013-11-23 – 2013-11-28 (×6): 20 meq via ORAL
  Filled 2013-11-22 (×6): qty 1
  Filled 2013-11-22: qty 2
  Filled 2013-11-22 (×3): qty 1

## 2013-11-22 MED ORDER — SPIRONOLACTONE 12.5 MG HALF TABLET
12.5000 mg | ORAL_TABLET | Freq: Every day | ORAL | Status: DC
  Administered 2013-11-23: 12.5 mg via ORAL
  Administered 2013-11-24: 09:00:00 via ORAL
  Administered 2013-11-25 – 2013-11-28 (×4): 12.5 mg via ORAL
  Filled 2013-11-22 (×9): qty 1

## 2013-11-22 MED ORDER — MAGNESIUM HYDROXIDE 400 MG/5ML PO SUSP
30.0000 mL | Freq: Every day | ORAL | Status: DC | PRN
  Administered 2013-11-26: 30 mL via ORAL

## 2013-11-22 MED ORDER — METOPROLOL SUCCINATE ER 50 MG PO TB24
150.0000 mg | ORAL_TABLET | Freq: Every day | ORAL | Status: DC
  Administered 2013-11-23 – 2013-11-28 (×6): 150 mg via ORAL
  Filled 2013-11-22 (×8): qty 1

## 2013-11-22 NOTE — ED Notes (Signed)
[place] transportation called, informed it could take up to an hour to pick pt up.

## 2013-11-22 NOTE — ED Notes (Signed)
telepsych in progress 

## 2013-11-22 NOTE — ED Notes (Signed)
Pt transported to xray 

## 2013-11-22 NOTE — Progress Notes (Signed)
Adult Psychoeducational Group Note  Date:  11/22/2013 Time:  3:48 PM  Group Topic/Focus:  Healthy Communication:   The focus of this group is to discuss communication, barriers to communication, as well as healthy ways to communicate with others.  Participation Level:  Did Not Attend   Additional Comments:  Pt was sent to [facility] for examination.   Tora Perches N 11/22/2013, 3:48 PM

## 2013-11-22 NOTE — BH Assessment (Signed)
Tele Assessment Note  Patient is a 52 year old African-American male with SI.  Patient has a plan to walk into traffic.  Patient reports past history of previous suicidal ideations.  Patient is not able to contract for safety.   Patient reports increased feelings of depression and hopelessness associated with his diagnosis of HIV and his inability to stop using crack cocaine for 4 straight days.  Patient reports that, "he was recently put on onto the street".   Patient reports that he is not compliant with taking his psychiatric medication.  Patient is not able to remember what he takes or who prescribed his medication.    Patient reports last use of cocaine, alcohol and marijuana yesterday.  Patient does not remember the quantity.  Patient denies prior detox or treatment.  Patient reports a prior history of psychiatric hospitalizations for depression and psychosis.    Patient denies withdrawal symptoms.  Patient UDS was positive for cocaine.  Patient BAL is <11.  Patient denies HI.  Patient denies psychosis.     Axis I: Major Depression, Recurrent severe and Bipolar Disorder  Axis II: Deferred Axis III:  Past Medical History  Diagnosis Date  . HIV (human immunodeficiency virus infection)   . Chronic systolic heart failure   . Hypertension   . Genital herpes   . Active smoker   . AR (allergic rhinitis)   . HLD (hyperlipidemia)   . NICM (nonischemic cardiomyopathy)     Echocardiogram 06/28/11: EF 30-35%, mild MR, mild LAE;  No CAD by coronary CT angiogram 3/12 at [facility]  . NSVT (nonsustained ventricular tachycardia)   . Depression   . Bipolar 1 disorder   . Alcohol abuse   . Crack cocaine use    Axis IV: housing problems, occupational problems, other psychosocial or environmental problems, problems related to social environment, problems with access to health care services and problems with primary support group Axis V: 31-40 impairment in reality testing  Past  Medical History:  Past Medical History  Diagnosis Date  . HIV (human immunodeficiency virus infection)   . Chronic systolic heart failure   . Hypertension   . Genital herpes   . Active smoker   . AR (allergic rhinitis)   . HLD (hyperlipidemia)   . NICM (nonischemic cardiomyopathy)     Echocardiogram 06/28/11: EF 30-35%, mild MR, mild LAE;  No CAD by coronary CT angiogram 3/12 at [facility]  . NSVT (nonsustained ventricular tachycardia)   . Depression   . Bipolar 1 disorder   . Alcohol abuse   . Crack cocaine use     History reviewed. No pertinent past surgical history.  Family History: History reviewed. No pertinent family history.  Social History:  reports that he has been smoking Cigarettes.  He has a 24 pack-year smoking history. He has never used smokeless tobacco. He reports that he drinks alcohol. He reports that he uses illicit drugs (Cocaine and Marijuana).  Additional Social History:     CIWA: CIWA-Ar BP: 114/73 mmHg Pulse Rate: 82 Nausea and Vomiting: no nausea and no vomiting Tactile Disturbances: none Tremor: no tremor Auditory Disturbances: not present Paroxysmal Sweats: no sweat visible Visual Disturbances: not present Anxiety: no anxiety, at ease Headache, Fullness in Head: none present Agitation: normal activity Orientation and Clouding of Sensorium: oriented and can do serial additions CIWA-Ar Total: 0 COWS:    Allergies: No Known Allergies  Home Medications:  (Not in a hospital admission)  OB/GYN Status:  No LMP for  male patient.  General Assessment Data Location of Assessment: [facility] Services Is this a Tele or Face-to-Face Assessment?: Tele Assessment Is this an Initial Assessment or a Re-assessment for this encounter?: Initial Assessment Living Arrangements: Other (Comment) Can pt return to current living arrangement?: Yes Admission Status: Voluntary Is patient capable of signing voluntary admission?: Yes Transfer  from: Acute Hospital Referral Source: Self/Family/Friend  Medical Screening Exam [facility] Walk-in ONLY) Medical Exam completed: Yes  [facility] Crisis Care Plan Living Arrangements: Other (Comment) Name of Psychiatrist: Unable to remember. Name of Therapist: Unable to remember the name.  Education Status Is patient currently in school?: No Current Grade: NA Highest grade of school patient has completed: NA Name of school: NA Contact person: NA  Risk to self Suicidal Ideation: Yes-Currently Present Suicidal Intent: Yes-Currently Present Is patient at risk for suicide?: Yes Suicidal Plan?: Yes-Currently Present Specify Current Suicidal Plan: Walking in front of traffic. Access to Means: Yes Specify Access to Suicidal Means: Hit by a car. What has been your use of drugs/alcohol within the last 12 months?: Crack Cocaine;  Substance abuse history and/or treatment for substance abuse?: Yes                                 Values / Beliefs Cultural Requests During Hospitalization: None Spiritual Requests During Hospitalization: None              Disposition:     Linton Rump 11/22/2013 4:29 AM

## 2013-11-22 NOTE — [facility] Group Notes (Signed)
[facility] Group Notes: (Clinical Social Work)   11/22/2013      Type of Therapy:  Group Therapy   Participation Level:  Did Not Attend    Ambrose Mantle, LCSW 11/22/2013, 4:31 PM

## 2013-11-22 NOTE — Progress Notes (Signed)
D. Pt has been in his room for much of the evening, very minimal interaction or participation in milieu. Pt did come out and get a snack and did socialize briefly. Pt appears depressed tonight. Pt interaction has been appropriate, pt has not been belligerent with staff this evening. Pt did choose to refuse medications this evening and spoke about how he told them earlier about his medications, pt did not verbalize any complaints. A. Support and encouragement provided. R. Safety maintained, will continue to monitor.

## 2013-11-22 NOTE — Progress Notes (Signed)
Tele Assessment is scheduled for 3:13am

## 2013-11-22 NOTE — Progress Notes (Signed)
Adult Psychoeducational Group Note  Date:  11/22/2013 Time:  8:00 pm  Group Topic/Focus:  Wrap-Up Group:   The focus of this group is to help patients review their daily goal of treatment and discuss progress on daily workbooks.  Participation Level:  Did Not Attend  Modena Nunnery 11/22/2013, 10:57 PM

## 2013-11-22 NOTE — Progress Notes (Addendum)
Writer consulted with the extender at [facility] Drenda Freeze, NP) regarding the patient meeting criteria for inpatient hospitalization. Per, Drenda Freeze, NP the patient has been accepted to [facility] Bed 501[facility]2 by Dr. Brayton Mars.   Writer informed the ER MD (Dr. Bebe Shaggy) and the nurse working with the patient Kriste Basque) of the patients disposition. The nurse will fax the support paperwork.  Report will be called to [phone].

## 2013-11-22 NOTE — [facility] Group Notes (Signed)
[facility] Group Notes:  (Nursing/MHT/Case Management/Adjunct)  Date:  11/22/2013  Time:  2:23 PM  Type of Therapy:  Nurse Education  Participation Level:  Did Not Attend  Participation Quality:  Inattentive  Affect:  Depressed  Cognitive:  Lacking  Insight:  Lacking  Engagement in Group:  Lacking  Modes of Intervention:  Education  Summary of Progress/Problems:pt did not attend group  Rodman Key [facility] 11/22/2013, 2:23 PM

## 2013-11-22 NOTE — Progress Notes (Signed)
Pt. Is a 52 yo AAM who presents after a 3-4 day alcohol/crack binge with associated SI after being "put out" of his apartment.  Pt. Reports plan to "jump in front of a car".  Pt. Initially very anxious, easily agitated by questioning.  Pt. Eventually opens up stating that he had a housing voucher that was taken away from him when he declined housing that was reportedly infested with bedbugs.  Pt. States that he has filed for disability but missed his hearing on 11/19/13.  Pt. Does contract for safety while on the unit and will be monitored with q 15 minute safety checks.

## 2013-11-22 NOTE — Progress Notes (Addendum)
Pt refused to attend groups earlier and came to the nurses station demanding to be put back on Wellbuterin. Explained to pt that the NP placed him on zoloft because of the effects wellbuterin has on the heart. Pt pushed all the pills back at the nurse and stated,"until you get it right I am not taking anything." NP made aware. Pt was escorted to lunch with a tech.Pt does deny SI and HI and does contract for safety. He appears very angry and loud. NP spoke with internal medicine to discuss what medications this pt should remain on. 12:30pm-Pt returned from lunch and stated,"I ain't taking nothing till you get all my medicines right. The bag is up there and I am not taking  zoloft." NP made aware.Phoned [facility] and pt will be taken via [facility] or [facility] this afternoon for a 2_D echo. At 1:3opm pt taken via [facility] to [facility] for a 2 D echo. A tech did go with him.2:40pm-Pt returned from echo. According to the tech he was very rude and was mad that he was only given a heavy blanket to use for transport. He stated that we should have supplied him with a coat. Pt threw the blanket on the floor in the cardiology room and refused to pick it up. AC made aware that pt wishes to file a grievance against [facility]. NP made aware that pt is very unhappy that he can not take all the medications particularly his wellbuterin which he has been on. Pt presently is in his room lying on the bed. Report given to the oncoming nurse.

## 2013-11-22 NOTE — ED Notes (Signed)
Spoke with dr Bebe Shaggy, pt medically cleared, IV can be taken out.

## 2013-11-22 NOTE — Progress Notes (Signed)
  Echocardiogram 2D Echocardiogram has been performed.  Ryan Long 11/22/2013, 2:29 PM

## 2013-11-22 NOTE — H&P (Signed)
Psychiatric Admission Assessment Adult  Patient Identification:  Ryan Long Date of Evaluation:  11/22/2013 Chief Complaint:  MDD,REC,SEV BIPOLAR D/O,NOS History of Present Illness:  52 year old male with a history of a dilated cardiomyopathy likely secondary to his HIV history according to cardiology notes. He has a known history of congestive heart failure with an ejection fraction of 35% but nonobstructive coronary disease according to an angiogram performed 2 years ago. He presents with recurrent chest pain after using 4 straight days of smoking crack cocaine. He states that he was recently put on onto the street in his own words, he was evicted from his home in [place], is feeling more depressed, suicidal and states that he has a plans-- walking from a car on the street. He has been seen in the emergency department in the past for both chest pain and for his psychiatric complaints. At this time his heart and lungs are clear other than mild expiratory wheezing, there is no signs of acute ischemia on his EKG infected is improved compared to prior EKGs where there was T wave inversions. While the patient is here in the emergency department he will need several sets of cardiac enzymes, I have discussed his care with the behavioral health assessment team who will see him in regards to his psychiatric complaints as well.  Irritable and would not go over his medications with this extender.  Elements:  Location:  generalized. Quality:  acute. Severity:  severe. Timing:  constant. Duration:  past two weeks. Context:  stressors. Associated Signs/Synptoms: Depression Symptoms:  depressed mood, feelings of worthlessness/guilt, difficulty concentrating, (Hypo) Manic Symptoms:  Denies Anxiety Symptoms:  Denies Psychotic Symptoms:  Denies PTSD Symptoms:  Denies  Psychiatric Specialty Exam: Physical Exam  Constitutional: He is oriented to person, place, and time. He appears well-developed and  well-nourished.  HENT:  Head: Normocephalic and atraumatic.  Neck: Normal range of motion.  Respiratory: Effort normal.  GI: Soft.  Musculoskeletal: Normal range of motion.  Neurological: He is alert and oriented to person, place, and time.  Skin: Skin is warm and dry.   Complete physical in ED, reviewed, concur with findings  Review of Systems  Constitutional: Negative.   HENT: Negative.   Eyes: Negative.   Respiratory: Negative.   Cardiovascular: Negative.   Gastrointestinal: Negative.   Genitourinary: Negative.   Musculoskeletal: Negative.   Skin: Negative.   Neurological: Negative.   Endo/Heme/Allergies: Negative.   Psychiatric/Behavioral: Positive for depression and substance abuse.    Blood pressure 114/77, pulse 77, temperature 97.6 F (36.4 C), temperature source Oral, resp. rate 18, height 5\' 9"  (1.753 m), weight 86.637 kg (191 lb), SpO2 99.00%.Body mass index is 28.19 kg/(m^2).  General Appearance: Disheveled  Eye Contact::  Poor  Speech:  Slow  Volume:  Normal  Mood:  Depressed and Irritable  Affect:  Congruent  Thought Process:  Coherent  Orientation:  Full (Time, Place, and Person)  Thought Content:  WDL  Suicidal Thoughts:  Yes.  with intent/plan  Homicidal Thoughts:  No  Memory:  Immediate;   Fair Recent;   Fair Remote;   Fair  Judgement:  Poor  Insight:  Lacking  Psychomotor Activity:  Decreased  Concentration:  Fair  Recall:  Fair  Akathisia:  No  Handed:  Right  AIMS (if indicated):     Assets:  Resilience  Sleep:       Past Psychiatric History: Diagnosis:  Depression, polysubstance dependency  Hospitalizations:  [facility]  Outpatient Care:  None  Substance Abuse  Care:  [facility]  Self-Mutilation:  None  Suicidal Attempts:  None  Violent Behaviors:  None   Past Medical History:   Past Medical History  Diagnosis Date  . HIV (human immunodeficiency virus infection)   . Chronic systolic heart failure   . Hypertension   . Genital herpes   .  Active smoker   . AR (allergic rhinitis)   . HLD (hyperlipidemia)   . NICM (nonischemic cardiomyopathy)     Echocardiogram 06/28/11: EF 30-35%, mild MR, mild LAE;  No CAD by coronary CT angiogram 3/12 at [facility]  . NSVT (nonsustained ventricular tachycardia)   . Depression   . Bipolar 1 disorder   . Alcohol abuse   . Crack cocaine use   . COPD (chronic obstructive pulmonary disease)   . CHF (congestive heart failure)    None. Allergies:  No Known Allergies PTA Medications: Prescriptions prior to admission  Medication Sig Dispense Refill  . acetaminophen (TYLENOL) 325 MG tablet Take 650 mg by mouth every 8 (eight) hours as needed. For pain/fever      . albuterol (PROVENTIL HFA;VENTOLIN HFA) 108 (90 BASE) MCG/ACT inhaler Inhale 2 puffs into the lungs as needed for shortness of breath. Per pt med is as needed with no frequency for asthma.  1 Inhaler  0  . aspirin 81 MG tablet Take 1 tablet (81 mg total) by mouth daily. For decreased platelet aggregation.  30 tablet  0  . buPROPion (WELLBUTRIN XL) 150 MG 24 hr tablet Take 1 tablet (150 mg total) by mouth daily. For depression.  30 tablet  0  . digoxin (LANOXIN) 0.125 MG tablet Take 1 tablet (125 mcg total) by mouth daily. For control of heart arrythmia.      Marland Kitchen doxycycline (VIBRAMYCIN) 100 MG capsule Take 100 mg by mouth 2 (two) times daily. Started on 10-21 for 10 therapy. Not finished on day 6      . emtricitabine-tenofovir (TRUVADA) 200-300 MG per tablet Take 1 tablet by mouth daily. For HIV      . furosemide (LASIX) 40 MG tablet Take 1 tablet (40 mg total) by mouth daily. For edema  30 tablet  0  . hydrALAZINE (APRESOLINE) 10 MG tablet Take 10 mg by mouth 2 (two) times daily.      . hydrochlorothiazide (HYDRODIURIL) 25 MG tablet Take 25 mg by mouth daily.      . hydrOXYzine (ATARAX/VISTARIL) 25 MG tablet Take 25 mg by mouth 3 (three) times daily as needed.      . isosorbide dinitrate (ISORDIL) 10 MG tablet Take 5 mg by  mouth daily.      Marland Kitchen lisinopril (PRINIVIL,ZESTRIL) 5 MG tablet Take 1 tablet (5 mg total) by mouth daily. For hypertension      . metoprolol succinate (TOPROL-XL) 50 MG 24 hr tablet Take 150 mg by mouth daily. Take with or immediately following a meal.      . Multiple Vitamin (MULTIVITAMIN) tablet Take 1 tablet by mouth daily. For vitamin deficiency      . raltegravir (ISENTRESS) 400 MG tablet Take 1 tablet (400 mg total) by mouth 2 (two) times daily. For HIV      . spironolactone (ALDACTONE) 25 MG tablet Take 12.5 mg by mouth daily.      . traZODone (DESYREL) 150 MG tablet Take 1 tablet (150 mg total) by mouth at bedtime. For sleep.      . valACYclovir (VALTREX) 500 MG tablet Take 1 tablet (500 mg total) by mouth  2 (two) times daily. For Herpes.        Previous Psychotropic Medications:  Medication/Dose    See above   Substance Abuse History in the last 12 months:  yes  Consequences of Substance Abuse: Medical Consequences:  chest pain  Social History:  reports that he has been smoking Cigarettes.  He has a 24 pack-year smoking history. He has never used smokeless tobacco. He reports that he drinks alcohol. He reports that he uses illicit drugs (Cocaine and Marijuana). Additional Social History:                      Current Place of Residence:   Place of Birth:   Family Members: Marital Status:  Single Children:  Sons:  Daughters: Relationships: Education:  Corporate treasurer Problems/Performance: Religious Beliefs/Practices: History of Abuse (Emotional/Phsycial/Sexual) Teacher, music History:  None. Legal History: Hobbies/Interests:  Family History:  History reviewed. No pertinent family history.  Results for orders placed during the hospital encounter of 11/21/13 (from the past 72 hour(s))  URINE RAPID DRUG SCREEN (HOSP PERFORMED)     Status: Abnormal   Collection Time    11/21/13 10:47 PM      Result Value Range   Opiates NONE DETECTED   NONE DETECTED   Cocaine POSITIVE (*) NONE DETECTED   Benzodiazepines NONE DETECTED  NONE DETECTED   Amphetamines NONE DETECTED  NONE DETECTED   Tetrahydrocannabinol NONE DETECTED  NONE DETECTED   Barbiturates NONE DETECTED  NONE DETECTED   Comment:            DRUG SCREEN FOR MEDICAL PURPOSES     ONLY.  IF CONFIRMATION IS NEEDED     FOR ANY PURPOSE, NOTIFY LAB     WITHIN 5 DAYS.                LOWEST DETECTABLE LIMITS     FOR URINE DRUG SCREEN     Drug Class       Cutoff (ng/mL)     Amphetamine      1000     Barbiturate      200     Benzodiazepine   200     Tricyclics       300     Opiates          300     Cocaine          300     THC              50  URINALYSIS, ROUTINE W REFLEX MICROSCOPIC     Status: None   Collection Time    11/21/13 10:47 PM      Result Value Range   Color, Urine YELLOW  YELLOW   APPearance CLEAR  CLEAR   Specific Gravity, Urine 1.010  1.005 - 1.030   pH 6.0  5.0 - 8.0   Glucose, UA NEGATIVE  NEGATIVE mg/dL   Hgb urine dipstick NEGATIVE  NEGATIVE   Bilirubin Urine NEGATIVE  NEGATIVE   Ketones, ur NEGATIVE  NEGATIVE mg/dL   Protein, ur NEGATIVE  NEGATIVE mg/dL   Urobilinogen, UA 0.2  0.0 - 1.0 mg/dL   Nitrite NEGATIVE  NEGATIVE   Leukocytes, UA NEGATIVE  NEGATIVE   Comment: MICROSCOPIC NOT DONE ON URINES WITH NEGATIVE PROTEIN, BLOOD, LEUKOCYTES, NITRITE, OR GLUCOSE <1000 mg/dL.  CBC     Status: Abnormal   Collection Time    11/21/13 10:52 PM      Result Value  Range   WBC 3.0 (*) 4.0 - 10.5 K/uL   RBC 4.67  4.22 - 5.81 MIL/uL   Hemoglobin 16.1  13.0 - 17.0 g/dL   HCT 16.1  09.6 - 04.5 %   MCV 93.1  78.0 - 100.0 fL   MCH 34.5 (*) 26.0 - 34.0 pg   MCHC 37.0 (*) 30.0 - 36.0 g/dL   RDW 40.9  81.1 - 91.4 %   Platelets 160  150 - 400 K/uL  COMPREHENSIVE METABOLIC PANEL     Status: Abnormal   Collection Time    11/21/13 10:52 PM      Result Value Range   Sodium 139  135 - 145 mEq/L   Potassium 3.6  3.5 - 5.1 mEq/L   Comment: HEMOLYSIS AT THIS  LEVEL MAY AFFECT RESULT   Chloride 105  96 - 112 mEq/L   CO2 23  19 - 32 mEq/L   Glucose, Bld 92  70 - 99 mg/dL   BUN 15  6 - 23 mg/dL   Creatinine, Ser 7.82  0.50 - 1.35 mg/dL   Calcium 8.4  8.4 - 95.6 mg/dL   Total Protein 6.6  6.0 - 8.3 g/dL   Albumin 3.5  3.5 - 5.2 g/dL   AST 81 (*) 0 - 37 U/L   Comment: HEMOLYSIS AT THIS LEVEL MAY AFFECT RESULT   ALT 83 (*) 0 - 53 U/L   Comment: HEMOLYSIS AT THIS LEVEL MAY AFFECT RESULT   Alkaline Phosphatase 59  39 - 117 U/L   Total Bilirubin 0.7  0.3 - 1.2 mg/dL   GFR calc non Af Amer 85 (*) >90 mL/min   GFR calc Af Amer >90  >90 mL/min   Comment: (NOTE)     The eGFR has been calculated using the CKD EPI equation.     This calculation has not been validated in all clinical situations.     eGFR's persistently <90 mL/min signify possible Chronic Kidney     Disease.  ETHANOL     Status: None   Collection Time    11/21/13 10:52 PM      Result Value Range   Alcohol, Ethyl (B) <11  0 - 11 mg/dL   Comment:            LOWEST DETECTABLE LIMIT FOR     SERUM ALCOHOL IS 11 mg/dL     FOR MEDICAL PURPOSES ONLY  ACETAMINOPHEN LEVEL     Status: None   Collection Time    11/21/13 10:52 PM      Result Value Range   Acetaminophen (Tylenol), Serum <15.0  10 - 30 ug/mL   Comment:            THERAPEUTIC CONCENTRATIONS VARY     SIGNIFICANTLY. A RANGE OF 10-30     ug/mL MAY BE AN EFFECTIVE     CONCENTRATION FOR MANY PATIENTS.     HOWEVER, SOME ARE BEST TREATED     AT CONCENTRATIONS OUTSIDE THIS     RANGE.     ACETAMINOPHEN CONCENTRATIONS     >150 ug/mL AT 4 HOURS AFTER     INGESTION AND >50 ug/mL AT 12     HOURS AFTER INGESTION ARE     OFTEN ASSOCIATED WITH TOXIC     REACTIONS.  SALICYLATE LEVEL     Status: Abnormal   Collection Time    11/21/13 10:52 PM      Result Value Range   Salicylate Lvl <2.0 (*) 2.8 - 20.0  mg/dL  POCT I-STAT TROPONIN I     Status: None   Collection Time    11/21/13 11:04 PM      Result Value Range   Troponin i, poc  0.01  0.00 - 0.08 ng/mL   Comment 3            Comment: Due to the release kinetics of cTnI,     a negative result within the first hours     of the onset of symptoms does not rule out     myocardial infarction with certainty.     If myocardial infarction is still suspected,     repeat the test at appropriate intervals.  POCT I-STAT TROPONIN I     Status: None   Collection Time    11/22/13  3:45 AM      Result Value Range   Troponin i, poc 0.01  0.00 - 0.08 ng/mL   Comment 3            Comment: Due to the release kinetics of cTnI,     a negative result within the first hours     of the onset of symptoms does not rule out     myocardial infarction with certainty.     If myocardial infarction is still suspected,     repeat the test at appropriate intervals.   Psychological Evaluations:  Assessment:    Substance/Addictive Disorders:  Alcohol Related Disorder - Moderate (303.90) Depressive Disorders:  Major Depressive Disorder - Severe (296.23)  AXIS I:  Alcohol Abuse, Major Depression, single episode, Substance Abuse and Substance Induced Mood Disorder AXIS II:  Deferred AXIS III:   Past Medical History  Diagnosis Date  . HIV (human immunodeficiency virus infection)   . Chronic systolic heart failure   . Hypertension   . Genital herpes   . Active smoker   . AR (allergic rhinitis)   . HLD (hyperlipidemia)   . NICM (nonischemic cardiomyopathy)     Echocardiogram 06/28/11: EF 30-35%, mild MR, mild LAE;  No CAD by coronary CT angiogram 3/12 at [facility]  . NSVT (nonsustained ventricular tachycardia)   . Depression   . Bipolar 1 disorder   . Alcohol abuse   . Crack cocaine use   . COPD (chronic obstructive pulmonary disease)   . CHF (congestive heart failure)    AXIS IV:  economic problems, housing problems, occupational problems, other psychosocial or environmental problems, problems related to social environment and problems with primary support  group AXIS V:  41-50 serious symptoms  Treatment Plan/Recommendations:  Plan:  Review of chart, vital signs, medications, and notes. 1-Admit for crisis management and stabilization.  Estimated length of stay 5-7 days past his current stay of 1 2-Individual and group therapy encouraged 3-Medication management for depression and anxiety to reduce current symptoms to base line and improve the patient's overall level of functioning:  Medications reviewed with the patient and he stated no untoward effects, home medications in place and Wellbutrin discontinued, Zoloft added 4-Coping skills for depression, substance abuse, and anxiety developing-- 5-Continue crisis stabilization and management 6-Address health issues--monitoring vital signs, stable  7-Treatment plan in progress to prevent relapse of depression, substance abuse, and anxiety 8-Psychosocial education regarding relapse prevention and self-care 8-Health care follow up as needed for any health concerns  9-Call for consult with hospitalist for additional specialty patient services as needed.  Treatment Plan Summary: Daily contact with patient to assess and evaluate symptoms and progress in treatment Medication management  Current Medications:  No current facility-administered medications for this encounter.    Observation Level/Precautions:  15 minute checks  Laboratory:  completed, reviewed, stable  Psychotherapy:  Individual and group therapy  Medications:  Zoloft, vistaril  Consultations:  None  Discharge Concerns:  Housing    Estimated LOS:  5-7 days  Other:     I certify that inpatient services furnished can reasonably be expected to improve the patient's condition.   [name], Catha Nottingham, PMH-NP 12/6/201410:42 AM I have examined the patient and agreed with the findings of H&P and treatment plan. Also discussed to get a medical consultation for his complicated medical history and multiple diuretics.

## 2013-11-22 NOTE — ED Provider Notes (Signed)
Repeat troponin negative CXR negative Pt currently sleeping and stable Accepted to [facility]   Joya Gaskins, MD 11/22/13 [phone]

## 2013-11-22 NOTE — Progress Notes (Signed)
Medical consult obtained with medications adjusted after the consultant reviewed his EKG changes and reports--additional labs ordered.  Patient belligerent about his Wellbutrin but due to the possible cardiac changes with the medication, changed to Zoloft.  This was explained to the patient.

## 2013-11-22 NOTE — [facility] Suicide Risk Assessment (Signed)
Suicide Risk Assessment  Admission Assessment     Nursing information obtained from:    Demographic factors:    Current Mental Status:    Loss Factors:    Historical Factors:    Risk Reduction Factors:     CLINICAL FACTORS:   Depression:   Anhedonia Comorbid alcohol abuse/dependence Hopelessness Impulsivity Insomnia Dysthymia Alcohol/Substance Abuse/Dependencies More than one psychiatric diagnosis Unstable or Poor Therapeutic Relationship Previous Psychiatric Diagnoses and Treatments  COGNITIVE FEATURES THAT CONTRIBUTE TO RISK:  Closed-mindedness Polarized thinking Thought constriction (tunnel vision)    SUICIDE RISK:   Severe:  Frequent, intense, and enduring suicidal ideation, specific plan, no subjective intent, but some objective markers of intent (i.e., choice of lethal method), the method is accessible, some limited preparatory behavior, evidence of impaired self-control, severe dysphoria/symptomatology, multiple risk factors present, and few if any protective factors, particularly a lack of social support.  PLAN OF CARE: Wanted to walk in front of traffic. Using cocaine and impaired insight. Needs stabilization.  I certify that inpatient services furnished can reasonably be expected to improve the patient's condition.  Merleen Picazo 11/22/2013, 10:02 AM

## 2013-11-23 DIAGNOSIS — Z91199 Patient's noncompliance with other medical treatment and regimen due to unspecified reason: Secondary | ICD-10-CM

## 2013-11-23 DIAGNOSIS — Z9119 Patient's noncompliance with other medical treatment and regimen: Secondary | ICD-10-CM

## 2013-11-23 DIAGNOSIS — F332 Major depressive disorder, recurrent severe without psychotic features: Secondary | ICD-10-CM

## 2013-11-23 DIAGNOSIS — R45851 Suicidal ideations: Secondary | ICD-10-CM

## 2013-11-23 MED ORDER — BUPROPION HCL ER (XL) 150 MG PO TB24
150.0000 mg | ORAL_TABLET | Freq: Every day | ORAL | Status: DC
  Administered 2013-11-23 – 2013-11-24 (×2): 150 mg via ORAL
  Filled 2013-11-23 (×4): qty 1

## 2013-11-23 NOTE — [facility] Counselor (Signed)
LCSW attempted two times to approach patient about doing the Psychosocial Assessment.  Both times he was irritable and refused to answer questions, stating he was not going to cooperate as long as he was not getting the medications he had requested.  Ambrose Mantle, LCSW 11/23/2013, 4:51 PM

## 2013-11-23 NOTE — Progress Notes (Signed)
[facility] MD Progress Note  11/23/2013 9:48 AM Ryan Long  MRN:  [id number] Subjective:  Patient remains non-compliant with care, refused his vital signs this am.  When this practitioner talked with him, he was angry and demanding all of his regular medications---despite being told many times the consultant was used and he had additional tests yesterday.  Psychiatrist met with him and agreed to give him his Wellbutrin back.  Suicidal ideations remain.  He refuses to go to groups or any of his care at this time.  If this continues, discharge is recommended for refusal of care. Diagnosis:   DSM5:  Substance/Addictive Disorders:  Alcohol Related Disorder - Moderate (303.90) Depressive Disorders:  Major Depressive Disorder - Severe (296.23)  Axis I: Major Depression, Recurrent severe, Substance Abuse and Substance Induced Mood Disorder Axis II: Deferred Axis III:  Past Medical History  Diagnosis Date  . HIV (human immunodeficiency virus infection)   . Chronic systolic heart failure   . Hypertension   . Genital herpes   . Active smoker   . AR (allergic rhinitis)   . HLD (hyperlipidemia)   . NICM (nonischemic cardiomyopathy)     Echocardiogram 06/28/11: EF 30-35%, mild MR, mild LAE;  No CAD by coronary CT angiogram 3/12 at [facility]  . NSVT (nonsustained ventricular tachycardia)   . Depression   . Bipolar 1 disorder   . Alcohol abuse   . Crack cocaine use   . COPD (chronic obstructive pulmonary disease)   . CHF (congestive heart failure)    Axis IV: other psychosocial or environmental problems, problems related to social environment and problems with primary support group Axis V: 41-50 serious symptoms  ADL's:  Intact  Sleep: Good  Appetite:  Good  Suicidal Ideation:  Plan:  none Intent:  yes Means:  none Homicidal Ideation:  Denies  Psychiatric Specialty Exam: Review of Systems  Constitutional: Negative.   HENT: Negative.   Eyes: Negative.   Respiratory:  Negative.   Cardiovascular: Negative.   Gastrointestinal: Negative.   Genitourinary: Negative.   Musculoskeletal: Negative.   Skin: Negative.   Neurological: Negative.   Endo/Heme/Allergies: Negative.   Psychiatric/Behavioral: Positive for depression, suicidal ideas and substance abuse.    Blood pressure 120/77, pulse 64, temperature 97.9 F (36.6 C), temperature source Oral, resp. rate 16, height 5\' 9"  (1.753 m), weight 86.637 kg (191 lb), SpO2 99.00%.Body mass index is 28.19 kg/(m^2).  General Appearance: Casual  Eye Contact::  Minimal  Speech:  Normal Rate  Volume:  Normal  Mood:  Angry and Irritable  Affect:  Congruent  Thought Process:  Coherent  Orientation:  Full (Time, Place, and Person)  Thought Content:  WDL  Suicidal Thoughts:  Yes.  with intent/plan  Homicidal Thoughts:  No  Memory:  Immediate;   Fair Recent;   Fair Remote;   Fair  Judgement:  Fair  Insight:  Fair  Psychomotor Activity:  Decreased  Concentration:  Fair  Recall:  Fair  Akathisia:  No  Handed:  Right  AIMS (if indicated):     Assets:  Resilience  Sleep:  Number of Hours: 6   Current Medications: Current Facility-Administered Medications  Medication Dose Route Frequency Provider Last Rate Last Dose  . acetaminophen (TYLENOL) tablet 650 mg  650 mg Oral Q6H PRN Nanine Means, NP      . albuterol (PROVENTIL HFA;VENTOLIN HFA) 108 (90 BASE) MCG/ACT inhaler 2 puff  2 puff Inhalation Q4H PRN Nanine Means, NP      . alum &  mag hydroxide-simeth (MAALOX/MYLANTA) 200-200-20 MG/5ML suspension 30 mL  30 mL Oral Q4H PRN Nanine Means, NP      . aspirin EC tablet 81 mg  81 mg Oral Daily Nanine Means, NP      . buPROPion (WELLBUTRIN XL) 24 hr tablet 150 mg  150 mg Oral Daily Nanine Means, NP      . digoxin (LANOXIN) tablet 0.125 mg  0.125 mg Oral Daily Nanine Means, NP      . emtricitabine-tenofovir (TRUVADA) 200-300 MG per tablet 1 tablet  1 tablet Oral Daily Nanine Means, NP      . furosemide (LASIX) tablet 40  mg  40 mg Oral Daily Nanine Means, NP      . isosorbide dinitrate (ISORDIL) tablet 5 mg  5 mg Oral QAC breakfast Nanine Means, NP      . magnesium hydroxide (MILK OF MAGNESIA) suspension 30 mL  30 mL Oral Daily PRN Nanine Means, NP      . metoprolol succinate (TOPROL-XL) 24 hr tablet 150 mg  150 mg Oral Daily Nanine Means, NP      . potassium chloride SA (K-DUR,KLOR-CON) CR tablet 20 mEq  20 mEq Oral Daily Nanine Means, NP      . raltegravir (ISENTRESS) tablet 400 mg  400 mg Oral BID Nanine Means, NP      . spironolactone (ALDACTONE) tablet 12.5 mg  12.5 mg Oral Daily Nanine Means, NP      . traZODone (DESYREL) tablet 100 mg  100 mg Oral QHS Nanine Means, NP      . valACYclovir (VALTREX) tablet 500 mg  500 mg Oral Daily Nanine Means, NP        Lab Results:  Results for orders placed during the hospital encounter of 11/22/13 (from the past 48 hour(s))  DIGOXIN LEVEL     Status: Abnormal   Collection Time    11/22/13  7:45 PM      Result Value Range   Digoxin Level 0.3 (*) 0.8 - 2.0 ng/mL   Comment: Performed at [facility]  BASIC METABOLIC PANEL     Status: Abnormal   Collection Time    11/22/13  7:45 PM      Result Value Range   Sodium 136  135 - 145 mEq/L   Potassium 3.8  3.5 - 5.1 mEq/L   Chloride 105  96 - 112 mEq/L   CO2 22  19 - 32 mEq/L   Glucose, Bld 97  70 - 99 mg/dL   BUN 17  6 - 23 mg/dL   Creatinine, Ser 4.54  0.50 - 1.35 mg/dL   Calcium 8.6  8.4 - 09.8 mg/dL   GFR calc non Af Amer 83 (*) >90 mL/min   GFR calc Af Amer >90  >90 mL/min   Comment: (NOTE)     The eGFR has been calculated using the CKD EPI equation.     This calculation has not been validated in all clinical situations.     eGFR's persistently <90 mL/min signify possible Chronic Kidney     Disease.     Performed at [facility]    Physical Findings: AIMS: Facial and Oral Movements Muscles of Facial Expression: None, normal Lips and Perioral Area: None,  normal Jaw: None, normal Tongue: None, normal,Extremity Movements Upper (arms, wrists, hands, fingers): None, normal Lower (legs, knees, ankles, toes): None, normal, Trunk Movements Neck, shoulders, hips: None, normal, Overall Severity Severity of abnormal movements (highest score from questions above):  None, normal Incapacitation due to abnormal movements: None, normal Patient's awareness of abnormal movements (rate only patient's report): No Awareness, Dental Status Current problems with teeth and/or dentures?: No Does patient usually wear dentures?: No  CIWA:  CIWA-Ar Total: 2 COWS:  COWS Total Score: 2  Treatment Plan Summary: Daily contact with patient to assess and evaluate symptoms and progress in treatment Medication management  Plan:  Review of chart, vital signs, medications, and notes. 1-Individual and group therapy 2-Medication management for depression and anxiety:  Medications reviewed with the patient and Wellbutrin added with the removal of his new antidepressant 3-Coping skills for depression, anxiety, and anger 4-Continue crisis stabilization and management 5-Address health issues--monitoring vital signs, refused 6-Treatment plan in progress to prevent relapse of depression and cocaine abuse  Medical Decision Making Problem Points:  Established problem, stable/improving (1) and Review of psycho-social stressors (1) Data Points:  Review of new medications or change in dosage (2)  I certify that inpatient services furnished can reasonably be expected to improve the patient's condition.   Nanine Means, PMH-NP 11/23/2013, 9:48 AM I agreed with findings and treatment plan of this patient and have seen the patient.

## 2013-11-23 NOTE — Progress Notes (Signed)
Psychoeducational Group Note  Date: 11/23/2013 Time: 1015  Group Topic/Focus:  Making Healthy Choices:   The focus of this group is to help patients identify negative/unhealthy choices they were using prior to admission and identify positive/healthier coping strategies to replace them upon discharge.  Participation Level:  Did Not Attend  PAdditional Comments:    11/23/2013,7:38 PM Lenzi Marmo, Joie Bimler

## 2013-11-23 NOTE — Progress Notes (Signed)
Ryan Long has spent most of the day in his room.in his bed. HE  refused any and all of his meds ( up until about 1700) at which time he allowed himself to  Be cajoled and led to take his medicines. He is educated by this nurse, about his various types of meds, ie  cardiac ( heart),  Antiviral , antidepressant, and his potassium as well as multivitamin, antihypertensives, etc...    A HE is offered much positive feedback  Related to his ability to share his feelings honestly with this staff member. Additionally, he is encouraged to  Not isolate,to allow others to take care of him and to love him. He says this is a hard thing for him to learn to do.    R Safety is in place and poc cont.

## 2013-11-23 NOTE — [facility] Group Notes (Signed)
[facility] Group Notes: (Clinical Social Work)   11/23/2013      Type of Therapy:  Group Therapy   Participation Level:  Did Not Attend    Ambrose Mantle, LCSW 11/23/2013, 4:38 PM

## 2013-11-23 NOTE — Progress Notes (Signed)
Psychoeducational Group Note  Date: 11/23/2013 Time:  0930  Group Topic/Focus:  Gratefulness:  The focus of this group is to help patients identify what two things they are most grateful for in their lives. What helps ground them and to center them on their work to their recovery.  Participation Level: Did not attend  Kaydi Kley A  

## 2013-11-23 NOTE — Progress Notes (Signed)
Adult Psychoeducational Group Note  Date:  11/23/2013 Time:  08:00pm Group Topic/Focus:  Wrap-Up Group:   The focus of this group is to help patients review their daily goal of treatment and discuss progress on daily workbooks.  Participation Level:  None  Participation Quality:  Resistant  Affect:  Flat  Cognitive:  Lacking  Insight: Limited  Engagement in Group:  Resistant  Modes of Intervention:  Discussion and Education  Additional Comments:  Pt refused to participate in group but did attend.  Shelly Bombard D 11/23/2013, 9:08 PM

## 2013-11-23 NOTE — Progress Notes (Signed)
Adult Psychoeducational Group Note  Date:  11/23/2013 Time:  2:09 PM  Group Topic/Focus:  Therapeutic Activity  Participation Level:  Did Not Attend   Additional Comments: Pts are encouraged to attend all group sessions. Pt stayed in bed to sleep.  Tora Perches N 11/23/2013, 2:09 PM

## 2013-11-24 MED ORDER — BUPROPION HCL ER (XL) 300 MG PO TB24
300.0000 mg | ORAL_TABLET | Freq: Every day | ORAL | Status: DC
  Administered 2013-11-25 – 2013-11-28 (×4): 300 mg via ORAL
  Filled 2013-11-24 (×5): qty 1
  Filled 2013-11-24: qty 3

## 2013-11-24 MED ORDER — NICOTINE 21 MG/24HR TD PT24
21.0000 mg | MEDICATED_PATCH | Freq: Every day | TRANSDERMAL | Status: DC
  Administered 2013-11-24 – 2013-11-28 (×5): 21 mg via TRANSDERMAL
  Filled 2013-11-24 (×8): qty 1

## 2013-11-24 NOTE — [facility] Group Notes (Signed)
[facility] LCSW Group Therapy  11/24/2013 1:15 PM   Type of Therapy:  Group Therapy  Participation Level:  Did Not Attend - pt sat in group at the beginning, irritated, and refused to participate or even share his name.  Pt was than pulled out of group to meet with the doctor  Reyes Ivan, LCSW 11/24/2013 3:02 PM

## 2013-11-24 NOTE — Progress Notes (Signed)
Patient ID: Ryan Long, male   DOB: [birth year], 52 y.o.   MRN: [id number] D)  Came to group this evening, didn't want to participate in the discussion, sat with eyes closed, nearly asleep.  Had a snack, went to bed, minimal interaction. A)  Will continue to monitor for safety, continue POC R)  Safety maintained.

## 2013-11-24 NOTE — Progress Notes (Signed)
Recreation Therapy Notes  Date: 12.08.2014 Time: 3:00pm Location: [address] Dayroom  Group Topic: Wellness  Goal Area(s) Addresses:  Patient will define components of whole wellness. Patient will verbalize benefit of whole wellness.  Behavioral Response: Did not attend.   Angelly Spearing L Shanie Mauzy, LRT/CTRS  Athleen Feltner L 11/24/2013 4:11 PM 

## 2013-11-24 NOTE — Progress Notes (Signed)
Patient ID: Ryan Long, male   DOB: [birth year], 52 y.o.   MRN: [id number] [facility] MD Progress Note  11/24/2013 2:53 PM Ryan Long  MRN:  [id number]  Subjective:  Patient is seen and chart reviewed. Patient reportedly suffering with major depressive disorder, substance induced mood disorder and polysubstance abuse. Patient has lost his apartment because of refusing to accept an apartment with a bedbugs and then he lost his housing voucher. Patient reported he was taken train from [facility] to [place] and has been staying in her motel before coming to the emergency department with chest pain. Patient has been partially compliant with his medication and not motivated to the disposition plan. Patient has been partially extending groups today.   Diagnosis:   DSM5:  Substance/Addictive Disorders:  Alcohol Related Disorder - Moderate (303.90) Depressive Disorders:  Major Depressive Disorder - Severe (296.23)  Axis I: Major Depression, Recurrent severe, Substance Abuse and Substance Induced Mood Disorder Axis II: Deferred Axis III:  Past Medical History  Diagnosis Date  . HIV (human immunodeficiency virus infection)   . Chronic systolic heart failure   . Hypertension   . Genital herpes   . Active smoker   . AR (allergic rhinitis)   . HLD (hyperlipidemia)   . NICM (nonischemic cardiomyopathy)     Echocardiogram 06/28/11: EF 30-35%, mild MR, mild LAE;  No CAD by coronary CT angiogram 3/12 at [facility]  . NSVT (nonsustained ventricular tachycardia)   . Depression   . Bipolar 1 disorder   . Alcohol abuse   . Crack cocaine use   . COPD (chronic obstructive pulmonary disease)   . CHF (congestive heart failure)    Axis IV: other psychosocial or environmental problems, problems related to social environment and problems with primary support group Axis V: 41-50 serious symptoms  ADL's:  Intact  Sleep: Good  Appetite:  Good  Suicidal Ideation:  Plan:  none Intent:   yes Means:  none Homicidal Ideation:  Denies  Psychiatric Specialty Exam: Review of Systems  Constitutional: Negative.   HENT: Negative.   Eyes: Negative.   Respiratory: Negative.   Cardiovascular: Negative.   Gastrointestinal: Negative.   Genitourinary: Negative.   Musculoskeletal: Negative.   Skin: Negative.   Neurological: Negative.   Endo/Heme/Allergies: Negative.   Psychiatric/Behavioral: Positive for depression, suicidal ideas and substance abuse.    Blood pressure 102/68, pulse 90, temperature 97.8 F (36.6 C), temperature source Oral, resp. rate 18, height 5\' 9"  (1.753 m), weight 86.637 kg (191 lb), SpO2 99.00%.Body mass index is 28.19 kg/(m^2).  General Appearance: Casual  Eye Contact::  Minimal  Speech:  Normal Rate  Volume:  Normal  Mood:  Angry and Irritable  Affect:  Congruent  Thought Process:  Coherent  Orientation:  Full (Time, Place, and Person)  Thought Content:  WDL  Suicidal Thoughts:  Yes.  with intent/plan  Homicidal Thoughts:  No  Memory:  Immediate;   Fair Recent;   Fair Remote;   Fair  Judgement:  Fair  Insight:  Fair  Psychomotor Activity:  Decreased  Concentration:  Fair  Recall:  Fair  Akathisia:  No  Handed:  Right  AIMS (if indicated):     Assets:  Resilience  Sleep:  Number of Hours: 6.5   Current Medications: Current Facility-Administered Medications  Medication Dose Route Frequency Provider Last Rate Last Dose  . acetaminophen (TYLENOL) tablet 650 mg  650 mg Oral Q6H PRN Nanine Means, NP      . albuterol (PROVENTIL HFA;VENTOLIN HFA)  108 (90 BASE) MCG/ACT inhaler 2 puff  2 puff Inhalation Q4H PRN Nanine Means, NP      . alum & mag hydroxide-simeth (MAALOX/MYLANTA) 200-200-20 MG/5ML suspension 30 mL  30 mL Oral Q4H PRN Nanine Means, NP      . aspirin EC tablet 81 mg  81 mg Oral Daily Nanine Means, NP   81 mg at 11/24/13 0835  . buPROPion (WELLBUTRIN XL) 24 hr tablet 150 mg  150 mg Oral Daily Nanine Means, NP   150 mg at 11/24/13  0835  . digoxin (LANOXIN) tablet 0.125 mg  0.125 mg Oral Daily Nanine Means, NP   0.125 mg at 11/24/13 0835  . emtricitabine-tenofovir (TRUVADA) 200-300 MG per tablet 1 tablet  1 tablet Oral Daily Nanine Means, NP   1 tablet at 11/24/13 0835  . furosemide (LASIX) tablet 40 mg  40 mg Oral Daily Nanine Means, NP   40 mg at 11/24/13 0835  . isosorbide dinitrate (ISORDIL) tablet 5 mg  5 mg Oral QAC breakfast Nanine Means, NP   5 mg at 11/24/13 0703  . magnesium hydroxide (MILK OF MAGNESIA) suspension 30 mL  30 mL Oral Daily PRN Nanine Means, NP      . metoprolol succinate (TOPROL-XL) 24 hr tablet 150 mg  150 mg Oral Daily Nanine Means, NP   150 mg at 11/24/13 0835  . potassium chloride SA (K-DUR,KLOR-CON) CR tablet 20 mEq  20 mEq Oral Daily Nanine Means, NP   20 mEq at 11/24/13 0836  . raltegravir (ISENTRESS) tablet 400 mg  400 mg Oral BID Nanine Means, NP   400 mg at 11/24/13 4098  . spironolactone (ALDACTONE) tablet 12.5 mg  12.5 mg Oral Daily Nanine Means, NP      . traZODone (DESYREL) tablet 100 mg  100 mg Oral QHS Nanine Means, NP      . valACYclovir (VALTREX) tablet 500 mg  500 mg Oral Daily Nanine Means, NP   500 mg at 11/24/13 1191    Lab Results:  Results for orders placed during the hospital encounter of 11/22/13 (from the past 48 hour(s))  DIGOXIN LEVEL     Status: Abnormal   Collection Time    11/22/13  7:45 PM      Result Value Range   Digoxin Level 0.3 (*) 0.8 - 2.0 ng/mL   Comment: Performed at [facility]  BASIC METABOLIC PANEL     Status: Abnormal   Collection Time    11/22/13  7:45 PM      Result Value Range   Sodium 136  135 - 145 mEq/L   Potassium 3.8  3.5 - 5.1 mEq/L   Chloride 105  96 - 112 mEq/L   CO2 22  19 - 32 mEq/L   Glucose, Bld 97  70 - 99 mg/dL   BUN 17  6 - 23 mg/dL   Creatinine, Ser 4.78  0.50 - 1.35 mg/dL   Calcium 8.6  8.4 - 29.5 mg/dL   GFR calc non Af Amer 83 (*) >90 mL/min   GFR calc Af Amer >90  >90 mL/min   Comment: (NOTE)      The eGFR has been calculated using the CKD EPI equation.     This calculation has not been validated in all clinical situations.     eGFR's persistently <90 mL/min signify possible Chronic Kidney     Disease.     Performed at [facility]    Physical Findings: AIMS: Facial  and Oral Movements Muscles of Facial Expression: None, normal Lips and Perioral Area: None, normal Jaw: None, normal Tongue: None, normal,Extremity Movements Upper (arms, wrists, hands, fingers): None, normal Lower (legs, knees, ankles, toes): None, normal, Trunk Movements Neck, shoulders, hips: None, normal, Overall Severity Severity of abnormal movements (highest score from questions above): None, normal Incapacitation due to abnormal movements: None, normal Patient's awareness of abnormal movements (rate only patient's report): No Awareness, Dental Status Current problems with teeth and/or dentures?: No Does patient usually wear dentures?: No  CIWA:  CIWA-Ar Total: 2 COWS:  COWS Total Score: 2  Treatment Plan Summary: Daily contact with patient to assess and evaluate symptoms and progress in treatment Medication management  Plan:  Review of chart, vital signs, medications, and notes. 1-Individual and group therapy 2-Medication management for depression and anxiety:  Medications reviewed with the patient and increase Wellbutrin XL 300 mg daily  3-Coping skills for depression, anxiety, and anger 4-Continue crisis stabilization and management 5-Address health issues--monitoring vital signs, refused 6-Treatment plan in progress to prevent relapse of depression and cocaine abuse  Medical Decision Making Problem Points:  Established problem, stable/improving (1) and Review of psycho-social stressors (1) Data Points:  Review of new medications or change in dosage (2)  I certify that inpatient services furnished can reasonably be expected to improve the patient's condition.    Nehemiah Settle., M.D. 11/24/2013, 2:53 PM

## 2013-11-24 NOTE — Progress Notes (Signed)
D:  Patient up earlier in the shift, but has been in his room since after dinner.  Requested to be started on a nicotine patch. A:  Nicotine patch ordered after patients smoking history was verified.  First dose given before dinner.  Patient educated about changing sites daily and also instructed to remove the patch at night if it should cause him to have nightmares.   R:  Patient took all scheduled medications and was respectful of nursing staff.  He did go down to dinner but other than that he did not have much interaction with peers.

## 2013-11-24 NOTE — Progress Notes (Signed)
Adult Psychoeducational Group Note  Date:  11/24/2013 Time: 11:00 AM  Group Topic/Focus:  Dimensions of Wellness:   The focus of this group is to introduce the topic of wellness and discuss the role each dimension of wellness plays in total health.  Participation Level:  Did Not Attend   Additional Comments:  Patient did not attend.  Merleen Milliner 11/24/2013, 2:20 PM

## 2013-11-24 NOTE — Progress Notes (Signed)
D-pt c/o poor sleeping, c/o high anxiety and depression, pt c/o of thoughts of hurting himself but contracts for safety A-pt takes his medications but does not attend groups R-pt will be monitored for safety

## 2013-11-24 NOTE — [facility] Group Notes (Signed)
[facility] LCSW Aftercare Discharge Planning Group Note   11/24/2013  8:45 AM  Participation Quality:  Did Not Attend - pt sleeping in room  [facility], LCSW 11/24/2013 9:43 AM

## 2013-11-24 NOTE — Tx Team (Signed)
Interdisciplinary Treatment Plan Update (Adult)  Date: 11/24/2013  Time Reviewed:  9:45 AM  Progress in Treatment: Attending groups: Yes Participating in groups:  Yes Taking medication as prescribed:  Yes Tolerating medication:  Yes Family/Significant othe contact made: CSW assessing  Patient understands diagnosis:  Yes Discussing patient identified problems/goals with staff:  Yes Medical problems stabilized or resolved:  Yes Denies suicidal/homicidal ideation: Yes Issues/concerns per patient self-inventory:  Yes Other:  New problem(s) identified: N/A  Discharge Plan or Barriers: CSW assessing for appropriate referrals.  Reason for Continuation of Hospitalization: Anxiety Depression Medication Stabilization  Comments: N/A  Estimated length of stay: 3-5 days  For review of initial/current patient goals, please see plan of care.  Attendees: Patient:     Family:     Physician:  Dr. Johnalagadda 11/24/2013 10:43 AM   Nursing:   Carol Davis, RN 11/24/2013 10:43 AM   Clinical Social Worker:  Crescent Gotham Horton, LCSW 11/24/2013 10:43 AM   Other: Neil Mashburn, PA 11/24/2013 10:43 AM   Other:  Roxanne Brand, care coordination 11/24/2013 10:43 AM   Other:  Amanda Lawson, RN 11/24/2013 10:43 AM   Other:  Beverly Knight, RN 11/24/2013 10:43 AM   Other:    Other:    Other:    Other:    Other:    Other:     Scribe for Treatment Team:   Horton, Andreana Klingerman Nicole, 11/24/2013 10:43 AM   

## 2013-11-25 MED ORDER — TRAZODONE HCL 150 MG PO TABS
150.0000 mg | ORAL_TABLET | Freq: Every day | ORAL | Status: DC
  Administered 2013-11-25 – 2013-11-27 (×3): 150 mg via ORAL
  Filled 2013-11-25: qty 1
  Filled 2013-11-25: qty 3
  Filled 2013-11-25 (×3): qty 1

## 2013-11-25 MED ORDER — HYDROXYZINE HCL 50 MG PO TABS
50.0000 mg | ORAL_TABLET | Freq: Every day | ORAL | Status: DC
  Administered 2013-11-25 – 2013-11-27 (×3): 50 mg via ORAL
  Filled 2013-11-25 (×5): qty 1

## 2013-11-25 NOTE — Progress Notes (Signed)
Pt did not attend group this morning.  

## 2013-11-25 NOTE — Progress Notes (Signed)
The focus of this group is to educate the patient on the purpose and policies of crisis stabilization and provide a format to answer questions about their admission.  The group details unit policies and expectations of patients while admitted. Patient did not attend. 
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
## 2013-11-25 NOTE — [facility] Group Notes (Signed)
[facility] LCSW Group Therapy  11/25/2013 1:15 PM   Type of Therapy:  Group Therapy  Participation Level:  Did Not Attend - pt was sleeping in his room  Ryan Ivan, LCSW 11/25/2013 3:05 PM

## 2013-11-25 NOTE — BHH Suicide Risk Assessment (Signed)
[facility] Adult Inpatient Family/Significant Other Suicide Prevention Education  Suicide Prevention Education:   Patient Refusal for Family/Significant Other Suicide Prevention Education: The patient has refused to provide written consent for family/significant other to be provided Family/Significant Other Suicide Prevention Education during admission and/or prior to discharge.  Physician notified.  CSW provided suicide prevention information with patient.    The suicide prevention education provided includes the following:  Suicide risk factors  Suicide prevention and interventions  National Suicide Hotline telephone number  [facility] assessment telephone number  [facility] Emergency Assistance 911  [facility] and/or Residential Mobile Crisis Unit telephone number   Ryan Long, [place] 11/25/2013 9:28 AM

## 2013-11-25 NOTE — Progress Notes (Signed)
Patient ID: Ryan Long, male   DOB: 09-20-1961, 52 y.o.   MRN: [id number] D: Pt. Lying in bed, eyes closed, respiration even. A: Writer assessed client for s/s of distress. Staff will monitor q50min for safety. R: Pt. Is safe on the unit, no distress noted, respirations unlabored.

## 2013-11-25 NOTE — Progress Notes (Signed)
Adult Psychoeducational Group Note  Date:  11/25/2013 Time:  8:00 pm  Group Topic/Focus:  Wrap-Up Group:   The focus of this group is to help patients review their daily goal of treatment and discuss progress on daily workbooks.  Participation Level:  Active  Participation Quality:  Appropriate and Sharing  Affect:  Appropriate  Cognitive:  Appropriate  Insight: Appropriate  Engagement in Group:  Engaged  Modes of Intervention:  Discussion, Education, Socialization and Support  Additional Comments:  Pt stated that he is struggling with housing and addiction which have led to his hospitalization. 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   Laural Benes, Seleen Walter 11/25/2013, 11:27 PM

## 2013-11-25 NOTE — Progress Notes (Signed)
[facility] MD Progress Note  11/25/2013 11:28 AM Ryan Long  MRN:  [id number]  Subjective:  Patient complained about poor sleep because of inadequate medication and his roommate his snoring. Patient is suffering with major depressive disorder, substance induced mood disorder and polysubstance abuse and reportedly feeling hopeless, helpless and worthless. Patient stated that he is worried about being homeless for a long time. Patient was not sure he can get another housing voucher for transitional living home. Patient was taken train from [facility] to [place] and has been staying in motel before coming to the emergency department with chest pain which has no consequences and probably cocaine induced. Patient has been partially compliant with his medication and group therapies. Patient was found staying in his bed and isolate himself today. Patient was encouraged to participate in unit activities.   Diagnosis:   DSM5:  Substance/Addictive Disorders:  Alcohol Related Disorder - Moderate (303.90) Depressive Disorders:  Major Depressive Disorder - Severe (296.23)  Axis I: Major Depression, Recurrent severe, Substance Abuse and Substance Induced Mood Disorder Axis II: Deferred Axis III:  Past Medical History  Diagnosis Date  . HIV (human immunodeficiency virus infection)   . Chronic systolic heart failure   . Hypertension   . Genital herpes   . Active smoker   . AR (allergic rhinitis)   . HLD (hyperlipidemia)   . NICM (nonischemic cardiomyopathy)     Echocardiogram 06/28/11: EF 30-35%, mild MR, mild LAE;  No CAD by coronary CT angiogram 3/12 [facility]  . NSVT (nonsustained ventricular tachycardia)   . Depression   . Bipolar 1 disorder   . Alcohol abuse   . Crack cocaine use   . COPD (chronic obstructive pulmonary disease)   . CHF (congestive heart failure)    Axis IV: other psychosocial or environmental problems, problems related to social environment and problems with  primary support group Axis V: 41-50 serious symptoms  ADL's:  Intact  Sleep: Good  Appetite:  Good  Suicidal Ideation:  Plan:  none Intent:  yes Means:  none Homicidal Ideation:  Denies  Psychiatric Specialty Exam: Review of Systems  Constitutional: Negative.   HENT: Negative.   Eyes: Negative.   Respiratory: Negative.   Cardiovascular: Negative.   Gastrointestinal: Negative.   Genitourinary: Negative.   Musculoskeletal: Negative.   Skin: Negative.   Neurological: Negative.   Endo/Heme/Allergies: Negative.   Psychiatric/Behavioral: Positive for depression, suicidal ideas and substance abuse.    Blood pressure 102/68, pulse 90, temperature 97.8 F (36.6 C), temperature source Oral, resp. rate 18, height 5\' 9"  (1.753 m), weight 86.637 kg (191 lb), SpO2 99.00%.Body mass index is 28.19 kg/(m^2).  General Appearance: Casual  Eye Contact::  Minimal  Speech:  Normal Rate  Volume:  Normal  Mood:  Angry and Irritable  Affect:  Congruent  Thought Process:  Coherent  Orientation:  Full (Time, Place, and Person)  Thought Content:  WDL  Suicidal Thoughts:  Yes.  with intent/plan  Homicidal Thoughts:  No  Memory:  Immediate;   Fair Recent;   Fair Remote;   Fair  Judgement:  Fair  Insight:  Fair  Psychomotor Activity:  Decreased  Concentration:  Fair  Recall:  Fair  Akathisia:  No  Handed:  Right  AIMS (if indicated):     Assets:  Resilience  Sleep:  Number of Hours: 5.5   Current Medications: Current Facility-Administered Medications  Medication Dose Route Frequency Provider Last Rate Last Dose  . acetaminophen (TYLENOL) tablet 650 mg  650 mg Oral Q6H PRN Nanine Means, NP   650 mg at 11/25/13 0805  . albuterol (PROVENTIL HFA;VENTOLIN HFA) 108 (90 BASE) MCG/ACT inhaler 2 puff  2 puff Inhalation Q4H PRN Nanine Means, NP      . alum & mag hydroxide-simeth (MAALOX/MYLANTA) 200-200-20 MG/5ML suspension 30 mL  30 mL Oral Q4H PRN Nanine Means, NP      . aspirin EC tablet 81  mg  81 mg Oral Daily Nanine Means, NP   81 mg at 11/25/13 0755  . buPROPion (WELLBUTRIN XL) 24 hr tablet 300 mg  300 mg Oral Daily Nehemiah Settle, MD   300 mg at 11/25/13 0756  . digoxin (LANOXIN) tablet 0.125 mg  0.125 mg Oral Daily Nanine Means, NP   0.125 mg at 11/25/13 0756  . emtricitabine-tenofovir (TRUVADA) 200-300 MG per tablet 1 tablet  1 tablet Oral Daily Nanine Means, NP   1 tablet at 11/25/13 0755  . furosemide (LASIX) tablet 40 mg  40 mg Oral Daily Nanine Means, NP   40 mg at 11/25/13 0756  . hydrOXYzine (ATARAX/VISTARIL) tablet 50 mg  50 mg Oral QHS Nehemiah Settle, MD      . isosorbide dinitrate (ISORDIL) tablet 5 mg  5 mg Oral QAC breakfast Nanine Means, NP   5 mg at 11/25/13 0645  . magnesium hydroxide (MILK OF MAGNESIA) suspension 30 mL  30 mL Oral Daily PRN Nanine Means, NP      . metoprolol succinate (TOPROL-XL) 24 hr tablet 150 mg  150 mg Oral Daily Nanine Means, NP   150 mg at 11/25/13 0754  . nicotine (NICODERM CQ - dosed in mg/24 hours) patch 21 mg  21 mg Transdermal Daily Nehemiah Settle, MD   21 mg at 11/25/13 0757  . potassium chloride SA (K-DUR,KLOR-CON) CR tablet 20 mEq  20 mEq Oral Daily Nanine Means, NP   20 mEq at 11/25/13 0756  . raltegravir (ISENTRESS) tablet 400 mg  400 mg Oral BID Nanine Means, NP   400 mg at 11/25/13 0755  . spironolactone (ALDACTONE) tablet 12.5 mg  12.5 mg Oral Daily Nanine Means, NP   12.5 mg at 11/25/13 0756  . traZODone (DESYREL) tablet 150 mg  150 mg Oral QHS Nehemiah Settle, MD      . valACYclovir (VALTREX) tablet 500 mg  500 mg Oral Daily Nanine Means, NP   500 mg at 11/25/13 0756    Lab Results:  No results found for this or any previous visit (from the past 48 hour(s)).  Physical Findings: AIMS: Facial and Oral Movements Muscles of Facial Expression: None, normal Lips and Perioral Area: None, normal Jaw: None, normal Tongue: None, normal,Extremity Movements Upper (arms, wrists, hands,  fingers): None, normal Lower (legs, knees, ankles, toes): None, normal, Trunk Movements Neck, shoulders, hips: None, normal, Overall Severity Severity of abnormal movements (highest score from questions above): None, normal Incapacitation due to abnormal movements: None, normal Patient's awareness of abnormal movements (rate only patient's report): No Awareness, Dental Status Current problems with teeth and/or dentures?: No Does patient usually wear dentures?: No  CIWA:  CIWA-Ar Total: 2 COWS:  COWS Total Score: 2  Treatment Plan Summary: Daily contact with patient to assess and evaluate symptoms and progress in treatment Medication management  Plan:  Review of chart, vital signs, medications, and notes. 1-Individual and group therapy 2-Medication management for depression and anxiety:   Medications reviewed with the patient and continue Wellbutrin XL 300 mg daily Increase trazodone 150  mg at bedtime and add hydroxyzine 50 mg at bedtime for better control of insomnia and anxiety.  3-Coping skills for depression, anxiety, and anger 4-Continue crisis stabilization and management 5-Address health issues--monitoring vital signs, refused 6-Treatment plan in progress to prevent relapse of depression and cocaine abuse  Medical Decision Making Problem Points:  Established problem, stable/improving (1) and Review of psycho-social stressors (1) Data Points:  Review of new medications or change in dosage (2)  I certify that inpatient services furnished can reasonably be expected to improve the patient's condition.   Nehemiah Settle., M.D. 11/25/2013, 11:28 AM

## 2013-11-25 NOTE — Progress Notes (Signed)
Recreation Therapy Notes  Animal-Assisted Activity/Therapy (AAA/T) Program Checklist/Progress Notes Patient Eligibility Criteria Checklist & Daily Group note for Rec Tx Intervention  Date: 12.09.2014 Time: 2:45pm Location: [address]    AAA/T Program Assumption of Risk Form signed by Patient/ or Parent Legal Guardian yes  Patient is free of allergies or sever asthma  yes  Patient reports no fear of animals yes  Patient reports no history of cruelty to animals yes   Patient understands his/her participation is voluntary yes.  Patient washes hands before animal contact yes  Patient washes hands after animal contact yes  Behavioral Response: Did not attend.   Marykay Lex Danyl Deems, LRT/CTRS  Geoffrey Hynes L 11/25/2013 4:11 PM
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
## 2013-11-25 NOTE — Clinical Social Work Note (Signed)
CSW met with pt individually at this time.  Pt initially presented irritable but eventually was more pleasant.  Pt is homeless and interested in staying at the [facility], homeless shelter in [place].  Pt consented for [name], homeless liaison to come talk with pt about options.  No further needs voiced by pt at this time.    Ryan Ivan, LCSW 11/25/2013  10:38 AM

## 2013-11-25 NOTE — Progress Notes (Signed)
Patient ID: Ryan Long, male   DOB: March 22, 1961, 52 y.o.   MRN: [id number] D- Patient reports he requested medication for sleep last night and still could not sleep.  His appetite is improving and his energy level is low.  He is rating his depression at 9/10 and his hopelessness at 9/10.  He has been having thoughts of self harm but does contract for safety .  He is attending groups today and had a headache earlier that was relieved with tylenol.  A- Supported patient.  R-  Patient interacting with peers and staff.

## 2013-11-26 NOTE — Progress Notes (Signed)
Patient ID: Ryan Long, male   DOB: [birth year], 52 y.o.   MRN: [id number] Patient asleep; no s/s of distress noted. Respirations regular and unlabored.

## 2013-11-26 NOTE — Progress Notes (Signed)
D: Patient presents with flat, irritable affect and depressed mood. Patient complained of constipation. Declined self inventory sheet. He's attending groups and compliant with medication regimen.  A: Support and encouragement provided to patient. Administered scheduled medications per ordering MD. PRN Milk of Magnesia given for constipation. Monitor Q15 minute checks for safety.  R: Patient receptive. Denies SI/HI and auditory/visual hallucinations. Patient remains safe on the unit.

## 2013-11-26 NOTE — Tx Team (Signed)
Interdisciplinary Treatment Plan Update (Adult)  Date: 11/26/2013  Time Reviewed:  9:45 AM  Progress in Treatment: Attending groups: Yes Participating in groups:  Yes Taking medication as prescribed:  Yes Tolerating medication:  Yes Family/Significant othe contact made: No, pt refused Patient understands diagnosis:  Yes Discussing patient identified problems/goals with staff:  Yes Medical problems stabilized or resolved:  Yes Denies suicidal/homicidal ideation: Yes Issues/concerns per patient self-inventory:  Yes Other:  New problem(s) identified: N/A  Discharge Plan or Barriers: Pt will follow up at [facility] management and therapy.    Reason for Continuation of Hospitalization: Anxiety Depression Medication Stabilization  Comments: N/A  Estimated length of stay: 2-3 days  For review of initial/current patient goals, please see plan of care.  Attendees: Patient:     Family:     Physician:  Dr. Javier Glazier 11/26/2013 10:33 AM   Nursing:   Robbie Louis, RN 11/26/2013 10:33 AM   Clinical Social Worker:  Reyes Ivan, LCSW 11/26/2013 10:33 AM   Other: Marzetta Board, RN 11/26/2013 10:33 AM   Other:  Elizbeth Squires, care coordination 11/26/2013 10:33 AM   Other:  Juline Patch, LCSW 11/26/2013 10:33 AM   Other:  Audie Box, RN 11/26/2013 10:33 AM   Other:    Other:    Other:    Other:    Other:    Other:     Scribe for Treatment Team:   Carmina Miller, 11/26/2013 10:33 AM

## 2013-11-26 NOTE — BHH Group Notes (Signed)
[facility] LCSW Aftercare Discharge Planning Group Note   11/26/2013  8:45 AM  Participation Quality:  Did Not Attend - pt was sleeping in his room  Reyes Ivan, LCSW 11/26/2013 10:21 AM

## 2013-11-26 NOTE — Progress Notes (Signed)
Did not attend group 

## 2013-11-26 NOTE — BHH Group Notes (Signed)
[facility] LCSW Group Therapy  11/26/2013 1:15 PM   Type of Therapy:  Group Therapy  Participation Level:  Did Not Attend - pt went back to his room to sleep after meeting with [name], homeless liaison   Ryan Ivan, LCSW 11/26/2013 3:20 PM

## 2013-11-26 NOTE — Progress Notes (Signed)
Recreation Therapy Notes  Date: 12.10.2014 Time: 3:00pm Location: [address]  Group Topic: Leisure Education  Goal Area(s) Addresses:  Patient will identify positive leisure activities.  Patient will identify one positive benefit of participation in leisure activities.   Behavioral Response: Appropriate  Intervention: Game  Activity: Group Leisure ABC's. Patients were split into teams of 4, as a team they were asked to identify leisure activities to correspond with each letter of the alphabet. Patient lists were combined to make large group list.  Education:  Leisure Education, Pharmacologist, Building control surveyor.   Education Outcome: Acknowledges understanding  Clinical Observations/Feedback: Patient initially stated he did not want to participate in group activity, however after several minutes patient engaged with team closest to him. Patient successfully identified positive activities associated with leisure. Patient made no contributions to group discussion, but appeared to actively listen as he maintained appropriate eye contact with speaker.     Marykay Lex Jaleel Allen, LRT/CTRS  Jearl Klinefelter 11/26/2013 3:54 PM

## 2013-11-26 NOTE — Progress Notes (Signed)
Patient ID: Ryan Long, male   DOB: 1961-05-14, 52 y.o.   MRN: [id number] [facility] MD Progress Note  11/26/2013 12:30 PM Ryan Long  MRN:  [id number]  Subjective:  Patient complained about symptoms of depression and anxiety as 9/10 and continued to endorse on and off suicidal ideation without intention or plan. Patient contracts for safety in the hospital. Patient is main stress to find a place to live. Patient is better sleep and his appetite is improving.Patient is worried about being homeless for a long time. Patient was not sure he can get another housing voucher for transitional living home.   Patient was taken train from [place] to [place] and has been staying in [place]. Patient has been compliant with his medication and group therapies. Patient was encouraged to participate in unit activities.   Diagnosis:   DSM5:  Substance/Addictive Disorders:  Alcohol Related Disorder - Moderate (303.90) Depressive Disorders:  Major Depressive Disorder - Severe (296.23)  Axis I: Major Depression, Recurrent severe, Substance Abuse and Substance Induced Mood Disorder Axis II: Deferred Axis III:  Past Medical History  Diagnosis Date  . HIV (human immunodeficiency virus infection)   . Chronic systolic heart failure   . Hypertension   . Genital herpes   . Active smoker   . AR (allergic rhinitis)   . HLD (hyperlipidemia)   . NICM (nonischemic cardiomyopathy)     Echocardiogram 06/28/11: EF 30-35%, mild MR, mild LAE;  No CAD by coronary CT angiogram 3/12 at [facility]  . NSVT (nonsustained ventricular tachycardia)   . Depression   . Bipolar 1 disorder   . Alcohol abuse   . Crack cocaine use   . COPD (chronic obstructive pulmonary disease)   . CHF (congestive heart failure)    Axis IV: other psychosocial or environmental problems, problems related to social environment and problems with primary support group Axis V: 41-50 serious symptoms  ADL's:  Intact  Sleep:  Good  Appetite:  Good  Suicidal Ideation:  Plan:  none Intent:  yes Means:  none Homicidal Ideation:  Denies  Psychiatric Specialty Exam: Review of Systems  Constitutional: Negative.   HENT: Negative.   Eyes: Negative.   Respiratory: Negative.   Cardiovascular: Negative.   Gastrointestinal: Negative.   Genitourinary: Negative.   Musculoskeletal: Negative.   Skin: Negative.   Neurological: Negative.   Endo/Heme/Allergies: Negative.   Psychiatric/Behavioral: Positive for depression, suicidal ideas and substance abuse.    Blood pressure 103/72, pulse 81, temperature 97.4 F (36.3 C), temperature source Oral, resp. rate 24, height 5\' 9"  (1.753 m), weight 86.637 kg (191 lb), SpO2 99.00%.Body mass index is 28.19 kg/(m^2).  General Appearance: Casual  Eye Contact::  Minimal  Speech:  Normal Rate  Volume:  Normal  Mood:  Angry and Irritable  Affect:  Congruent  Thought Process:  Coherent  Orientation:  Full (Time, Place, and Person)  Thought Content:  WDL  Suicidal Thoughts:  Yes.  with intent/plan  Homicidal Thoughts:  No  Memory:  Immediate;   Fair Recent;   Fair Remote;   Fair  Judgement:  Fair  Insight:  Fair  Psychomotor Activity:  Decreased  Concentration:  Fair  Recall:  Fair  Akathisia:  No  Handed:  Right  AIMS (if indicated):     Assets:  Resilience  Sleep:  Number of Hours: 6.5   Current Medications: Current Facility-Administered Medications  Medication Dose Route Frequency Provider Last Rate Last Dose  . acetaminophen (TYLENOL) tablet 650 mg  650 mg  Oral Q6H PRN Nanine Means, NP   650 mg at 11/25/13 0805  . albuterol (PROVENTIL HFA;VENTOLIN HFA) 108 (90 BASE) MCG/ACT inhaler 2 puff  2 puff Inhalation Q4H PRN Nanine Means, NP      . alum & mag hydroxide-simeth (MAALOX/MYLANTA) 200-200-20 MG/5ML suspension 30 mL  30 mL Oral Q4H PRN Nanine Means, NP      . aspirin EC tablet 81 mg  81 mg Oral Daily Nanine Means, NP   81 mg at 11/26/13 0900  . buPROPion  (WELLBUTRIN XL) 24 hr tablet 300 mg  300 mg Oral Daily Nehemiah Settle, MD   300 mg at 11/26/13 0859  . digoxin (LANOXIN) tablet 0.125 mg  0.125 mg Oral Daily Nanine Means, NP   0.125 mg at 11/26/13 0900  . emtricitabine-tenofovir (TRUVADA) 200-300 MG per tablet 1 tablet  1 tablet Oral Daily Nanine Means, NP   1 tablet at 11/26/13 0859  . furosemide (LASIX) tablet 40 mg  40 mg Oral Daily Nanine Means, NP   40 mg at 11/26/13 0859  . hydrOXYzine (ATARAX/VISTARIL) tablet 50 mg  50 mg Oral QHS Nehemiah Settle, MD   50 mg at 11/25/13 2153  . isosorbide dinitrate (ISORDIL) tablet 5 mg  5 mg Oral QAC breakfast Nanine Means, NP   5 mg at 11/26/13 4782  . magnesium hydroxide (MILK OF MAGNESIA) suspension 30 mL  30 mL Oral Daily PRN Nanine Means, NP   30 mL at 11/26/13 1106  . metoprolol succinate (TOPROL-XL) 24 hr tablet 150 mg  150 mg Oral Daily Nanine Means, NP   150 mg at 11/26/13 0901  . nicotine (NICODERM CQ - dosed in mg/24 hours) patch 21 mg  21 mg Transdermal Daily Nehemiah Settle, MD   21 mg at 11/26/13 0900  . potassium chloride SA (K-DUR,KLOR-CON) CR tablet 20 mEq  20 mEq Oral Daily Nanine Means, NP   20 mEq at 11/26/13 0859  . raltegravir (ISENTRESS) tablet 400 mg  400 mg Oral BID Nanine Means, NP   400 mg at 11/26/13 0900  . spironolactone (ALDACTONE) tablet 12.5 mg  12.5 mg Oral Daily Nanine Means, NP   12.5 mg at 11/26/13 0858  . traZODone (DESYREL) tablet 150 mg  150 mg Oral QHS Nehemiah Settle, MD   150 mg at 11/25/13 2152  . valACYclovir (VALTREX) tablet 500 mg  500 mg Oral Daily Nanine Means, NP   500 mg at 11/26/13 9562    Lab Results:  No results found for this or any previous visit (from the past 48 hour(s)).  Physical Findings: AIMS: Facial and Oral Movements Muscles of Facial Expression: None, normal Lips and Perioral Area: None, normal Jaw: None, normal Tongue: None, normal,Extremity Movements Upper (arms, wrists, hands, fingers): None,  normal Lower (legs, knees, ankles, toes): None, normal, Trunk Movements Neck, shoulders, hips: None, normal, Overall Severity Severity of abnormal movements (highest score from questions above): None, normal Incapacitation due to abnormal movements: None, normal Patient's awareness of abnormal movements (rate only patient's report): No Awareness, Dental Status Current problems with teeth and/or dentures?: No Does patient usually wear dentures?: No  CIWA:  CIWA-Ar Total: 2 COWS:  COWS Total Score: 2  Treatment Plan Summary: Daily contact with patient to assess and evaluate symptoms and progress in treatment Medication management  Plan:  Review of chart, vital signs, medications, and notes. 1-Individual and group therapy 2-Medication management for depression and anxiety:   Medications reviewed with the patient Continue Wellbutrin XL  300 mg daily Continue Trazodonee 150 mg at bedtime  Continue hydroxyzine 50 mg at bedtime for better control of insomnia and anxiety.  3-Coping skills for depression, anxiety, and anger 4-Continue crisis stabilization and management 5-Address health issues--monitoring vital signs, refused 6-Treatment plan in progress to prevent relapse of depression and cocaine abuse 7. Disposition plans are in progress  Medical Decision Making Problem Points:  Established problem, stable/improving (1) and Review of psycho-social stressors (1) Data Points:  Review of new medications or change in dosage (2)  I certify that inpatient services furnished can reasonably be expected to improve the patient's condition.   Nehemiah Settle., M.D. 11/26/2013, 12:30 PM

## 2013-11-27 NOTE — Progress Notes (Signed)
Adult Psychoeducational Group Note  Date:  11/27/2013 Time:  2:02 PM  Group Topic/Focus:  Self Esteem Action Plan:   The focus of this group is to help patients create a plan to continue to build self-esteem after discharge.  Additional Comments:  Pt initially came to group, but then left shortly after.  Caswell Corwin 11/27/2013, 2:02 PM

## 2013-11-27 NOTE — Progress Notes (Signed)
Adult Psychoeducational Group Note  Date:  11/27/2013 Time:  1:13 PM  Group Topic/Focus:  Therapeutic Activity  Participation Level:  Minimal  Participation Quality:  Appropriate  Affect:  Flat  Cognitive:  Appropriate  Insight: Appropriate  Engagement in Group:  Engaged and Resistant  Modes of Intervention:  Activity  Additional Comments:  Pts played a game of Charades using different coping skills. Pt did not want to participate at first, but eventually did after some encouragement.  Caswell Corwin 11/27/2013, 1:13 PM

## 2013-11-27 NOTE — Progress Notes (Signed)
[facility] MD Progress Note  11/27/2013 1:57 PM Ryan Long  MRN:  [id number]  Subjective:  Patient stated that he has spoke with Mrs. Steward Drone, RN, from housing coalision  Who offered a shelter in [employer] and also daytime at [facility]. Patient was not sure they are going to provide storage or not and how it is going to occupy this weekend time. He continue to report symptoms depression and anxiety and rates as 8 /10  which is better than 10 out of 10 on admission.  patient continued to endorse, on and off suicidal ideation without intention or plan. Patient contracts for safety in the hospital. Patient  stated that has better sleep and appetite with medications. Patient has been compliant with his medication and group therapies. Patient was encouraged to participate in unit activities.   Diagnosis:   DSM5:  Substance/Addictive Disorders:  Alcohol Related Disorder - Moderate (303.90) Depressive Disorders:  Major Depressive Disorder - Severe (296.23)  Axis I: Major Depression, Recurrent severe, Substance Abuse and Substance Induced Mood Disorder Axis II: Deferred Axis III:  Past Medical History  Diagnosis Date  . HIV (human immunodeficiency virus infection)   . Chronic systolic heart failure   . Hypertension   . Genital herpes   . Active smoker   . AR (allergic rhinitis)   . HLD (hyperlipidemia)   . NICM (nonischemic cardiomyopathy)     Echocardiogram 06/28/11: EF 30-35%, mild MR, mild LAE;  No CAD by coronary CT angiogram 3/12 at [facility]  . NSVT (nonsustained ventricular tachycardia)   . Depression   . Bipolar 1 disorder   . Alcohol abuse   . Crack cocaine use   . COPD (chronic obstructive pulmonary disease)   . CHF (congestive heart failure)    Axis IV: other psychosocial or environmental problems, problems related to social environment and problems with primary support group Axis V: 41-50 serious symptoms  ADL's:  Intact  Sleep: Good  Appetite:   Good  Suicidal Ideation:  Plan:  none Intent:  yes Means:  none Homicidal Ideation:  Denies  Psychiatric Specialty Exam: Review of Systems  Constitutional: Negative.   HENT: Negative.   Eyes: Negative.   Respiratory: Negative.   Cardiovascular: Negative.   Gastrointestinal: Negative.   Genitourinary: Negative.   Musculoskeletal: Negative.   Skin: Negative.   Neurological: Negative.   Endo/Heme/Allergies: Negative.   Psychiatric/Behavioral: Positive for depression, suicidal ideas and substance abuse.    Blood pressure 97/70, pulse 84, temperature 98 F (36.7 C), temperature source Oral, resp. rate 16, height 5\' 9"  (1.753 m), weight 86.637 kg (191 lb), SpO2 99.00%.Body mass index is 28.19 kg/(m^2).  General Appearance: Casual  Eye Contact::  Minimal  Speech:  Normal Rate  Volume:  Normal  Mood:  Angry and Irritable  Affect:  Congruent  Thought Process:  Coherent  Orientation:  Full (Time, Place, and Person)  Thought Content:  WDL  Suicidal Thoughts:  Yes.  with intent/plan  Homicidal Thoughts:  No  Memory:  Immediate;   Fair Recent;   Fair Remote;   Fair  Judgement:  Fair  Insight:  Fair  Psychomotor Activity:  Decreased  Concentration:  Fair  Recall:  Fair  Akathisia:  No  Handed:  Right  AIMS (if indicated):     Assets:  Resilience  Sleep:  Number of Hours: 5.25   Current Medications: Current Facility-Administered Medications  Medication Dose Route Frequency Provider Last Rate Last Dose  . acetaminophen (TYLENOL) tablet 650 mg  650 mg Oral Q6H PRN Nanine Means, NP   650 mg at 11/25/13 0805  . albuterol (PROVENTIL HFA;VENTOLIN HFA) 108 (90 BASE) MCG/ACT inhaler 2 puff  2 puff Inhalation Q4H PRN Nanine Means, NP      . alum & mag hydroxide-simeth (MAALOX/MYLANTA) 200-200-20 MG/5ML suspension 30 mL  30 mL Oral Q4H PRN Nanine Means, NP      . aspirin EC tablet 81 mg  81 mg Oral Daily Nanine Means, NP   81 mg at 11/27/13 1018  . buPROPion (WELLBUTRIN XL) 24 hr  tablet 300 mg  300 mg Oral Daily Nehemiah Settle, MD   300 mg at 11/27/13 1019  . digoxin (LANOXIN) tablet 0.125 mg  0.125 mg Oral Daily Nanine Means, NP   0.125 mg at 11/27/13 1020  . emtricitabine-tenofovir (TRUVADA) 200-300 MG per tablet 1 tablet  1 tablet Oral Daily Nanine Means, NP   1 tablet at 11/27/13 1022  . furosemide (LASIX) tablet 40 mg  40 mg Oral Daily Nanine Means, NP   40 mg at 11/27/13 1023  . hydrOXYzine (ATARAX/VISTARIL) tablet 50 mg  50 mg Oral QHS Nehemiah Settle, MD   50 mg at 11/26/13 2230  . isosorbide dinitrate (ISORDIL) tablet 5 mg  5 mg Oral QAC breakfast Nanine Means, NP   5 mg at 11/27/13 0647  . magnesium hydroxide (MILK OF MAGNESIA) suspension 30 mL  30 mL Oral Daily PRN Nanine Means, NP   30 mL at 11/26/13 1106  . metoprolol succinate (TOPROL-XL) 24 hr tablet 150 mg  150 mg Oral Daily Nanine Means, NP   150 mg at 11/27/13 1024  . nicotine (NICODERM CQ - dosed in mg/24 hours) patch 21 mg  21 mg Transdermal Daily Nehemiah Settle, MD   21 mg at 11/27/13 0800  . potassium chloride SA (K-DUR,KLOR-CON) CR tablet 20 mEq  20 mEq Oral Daily Nanine Means, NP   20 mEq at 11/27/13 1026  . raltegravir (ISENTRESS) tablet 400 mg  400 mg Oral BID Nanine Means, NP   400 mg at 11/27/13 1026  . spironolactone (ALDACTONE) tablet 12.5 mg  12.5 mg Oral Daily Nanine Means, NP   12.5 mg at 11/27/13 1027  . traZODone (DESYREL) tablet 150 mg  150 mg Oral QHS Nehemiah Settle, MD   150 mg at 11/26/13 2230  . valACYclovir (VALTREX) tablet 500 mg  500 mg Oral Daily Nanine Means, NP   500 mg at 11/27/13 0800    Lab Results:  No results found for this or any previous visit (from the past 48 hour(s)).  Physical Findings: AIMS: Facial and Oral Movements Muscles of Facial Expression: None, normal Lips and Perioral Area: None, normal Jaw: None, normal Tongue: None, normal,Extremity Movements Upper (arms, wrists, hands, fingers): None, normal Lower (legs,  knees, ankles, toes): None, normal, Trunk Movements Neck, shoulders, hips: None, normal, Overall Severity Severity of abnormal movements (highest score from questions above): None, normal Incapacitation due to abnormal movements: None, normal Patient's awareness of abnormal movements (rate only patient's report): No Awareness, Dental Status Current problems with teeth and/or dentures?: No Does patient usually wear dentures?: No  CIWA:  CIWA-Ar Total: 2 COWS:  COWS Total Score: 2  Treatment Plan Summary: Daily contact with patient to assess and evaluate symptoms and progress in treatment Medication management  Plan:  Review of chart, vital signs, medications, and notes. 1-Individual and group therapy 2-Medication management for depression and anxiety:   Medications reviewed with the patient Continue  Wellbutrin XL 300 mg daily Continue Trazodonee 150 mg at bedtime  Continue hydroxyzine 50 mg at bedtime for better control of insomnia and anxiety.  3-Coping skills for depression, anxiety, and anger 4-Continue crisis stabilization and management 5-Address health issues--monitoring vital signs, refused 6-Treatment plan in progress to prevent relapse of depression and cocaine abuse 7. Disposition plans are in progress, may be discharged that day or 2 when he can show clinical improvement and contracts for safety.   Medical Decision Making Problem Points:  Established problem, stable/improving (1) and Review of psycho-social stressors (1) Data Points:  Review of new medications or change in dosage (2)  I certify that inpatient services furnished can reasonably be expected to improve the patient's condition.   Nehemiah Settle., M.D. 11/27/2013, 1:57 PM

## 2013-11-27 NOTE — Progress Notes (Signed)
Didn't attend group 

## 2013-11-27 NOTE — Progress Notes (Signed)
D: Patient's affect flat and mood is depressed. Patient has attended one group today, but other than the 10:00 group the writer has observed the patient lying in bed most of the day. Continues to isolate to room; does not interact with peers. Patient tolerating medications well. He voiced earlier this morning that he's to discharge on tomorrow.  A: Support and encouragement provided to patient. Scheduled medications administered per MD orders. Maintain Q15 minute checks for safety.  R: Patient receptive. Passive SI, but contracts for safety. Denies HI.  Patient remains safe.

## 2013-11-27 NOTE — [facility] Group Notes (Signed)
[facility] LCSW Group Therapy  11/27/2013  1:15 PM   Type of Therapy:  Group Therapy  Participation Level:  Active  Participation Quality:  Attentive, Sharing and Supportive  Affect:  Depressed and Flat  Cognitive:  Alert and Oriented  Insight:  Developing/Improving, Engaged and Supportive  Engagement in Therapy:  Developing/Improving, Engaged and Supportive  Modes of Intervention:  Activity, Clarification, Confrontation, Discussion, Education, Exploration, Limit-setting, Orientation, Problem-solving, Rapport Building, Reality Testing, Socialization and Support  Summary of Progress/Problems: Patient was attentive and engaged with speaker from [employer].  Patient was attentive to speaker while they shared their story of dealing with mental health and overcoming it.  Patient expressed interest in their programs and services and received information on their agency.  Patient processed ways they can relate to the speaker.     Loredana Medellin Horton, LCSW 11/27/2013 2:35 PM  

## 2013-11-27 NOTE — Progress Notes (Signed)
Patient ID: Ryan Long, male   DOB: Jan 01, 1961, 52 y.o.   MRN: [id number] D: Pt. Lying in bed, reports depression at "9" of 10. Pt. Reports passive SI, but contracts for safety. A: Writer provided emotional support, encouraged group. Staff will monitor q65min for safety. R: pt. Did not attends group, pt. Isolates. Pt. Is safe on the unit.

## 2013-11-27 NOTE — Progress Notes (Signed)
The focus of this group is to educate the patient on the purpose and policies of crisis stabilization and provide a format to answer questions about their admission.  The group details unit policies and expectations of patients while admitted.  Patient did not attend 0900 nurse education orientation group.  Patient was resting in his room.

## 2013-11-27 NOTE — Progress Notes (Signed)
Recreation Therapy Notes  Animal-Assisted Activity/Therapy (AAA/T) Program Checklist/Progress Notes Patient Eligibility Criteria Checklist & Daily Group note for Rec Tx Intervention  Date: 12.11.2014 Time: 2:45pm Location: [address] Programmer, applications   AAA/T Program Assumption of Risk Form signed by Patient/ or Parent Legal Guardian yes  Patient is free of allergies or sever asthma yes  Patient reports no fear of animals yes  Patient reports no history of cruelty to animals yes   Patient understands his/her participation is voluntary yes  Patient washes hands before animal contact yes  Patient washes hands after animal contact yes  Behavioral Response: Appropriate, Engaged, Attentive.   Education: Charity fundraiser, Health visitor   Education Outcome: Acknowledges understanding  Moussa Wiegand L Sapna Padron, LRT/CTRS  Jearl Klinefelter 11/27/2013 4:42 PM

## 2013-11-28 DIAGNOSIS — F1410 Cocaine abuse, uncomplicated: Secondary | ICD-10-CM

## 2013-11-28 MED ORDER — METOPROLOL SUCCINATE ER 50 MG PO TB24: 150.0000 mg | ORAL_TABLET | Freq: Every day | ORAL | Status: DC

## 2013-11-28 MED ORDER — HYDROXYZINE HCL 50 MG PO TABS
50.0000 mg | ORAL_TABLET | Freq: Three times a day (TID) | ORAL | Status: DC | PRN
  Filled 2013-11-28: qty 9

## 2013-11-28 MED ORDER — ISOSORBIDE DINITRATE 10 MG PO TABS: 5.0000 mg | ORAL_TABLET | Freq: Every day | ORAL | Status: DC

## 2013-11-28 MED ORDER — HYDROXYZINE HCL 25 MG PO TABS: 50.0000 mg | ORAL_TABLET | Freq: Three times a day (TID) | ORAL | Status: DC | PRN

## 2013-11-28 MED ORDER — BUPROPION HCL ER (XL) 150 MG PO TB24: 300.0000 mg | ORAL_TABLET | Freq: Every day | ORAL | Status: DC

## 2013-11-28 NOTE — Progress Notes (Signed)
Adult Psychoeducational Group Note  Date:  11/28/2013 Time:  11:09 AM  Group Topic/Focus:  Stages of Change:   The focus of this group is to explain the stages of change and help patients identify changes they want to make upon discharge.  Participation Level:  Active  Participation Quality:  Appropriate  Affect:  Appropriate  Cognitive:  Appropriate  Insight: Appropriate  Engagement in Group:  Engaged  Modes of Intervention:  Discussion and Education   Roanna Banning, [place] N 11/28/2013, 11:09 AM

## 2013-11-28 NOTE — Progress Notes (Signed)
Discharge Note: Discharge instructions/prescriptions/medication samples/taxi voucher given to patient. Patient verbalized understanding of discharge instructions and prescriptions. Returned belongings to patient. Denies SI/HI/AVH. Patient d/c without incident to the lobby and transported by taxi.

## 2013-11-28 NOTE — [facility] Suicide Risk Assessment (Signed)
Suicide Risk Assessment  Discharge Assessment     Demographic Factors:  Male, Adolescent or young adult, Low socioeconomic status, Living alone and Unemployed  Mental Status Per Nursing Assessment::   On Admission:     Current Mental Status by Physician: Mental Status Examination: Patient appeared as per his stated age, casually dressed, and fairly groomed, and maintaining good eye contact. Patient has good mood and his affect was constricted. He has normal rate, rhythm, and volume of speech. His thought process is linear and goal directed. Patient has denied suicidal, homicidal ideations, intentions or plans. Patient has no evidence of auditory or visual hallucinations, delusions, and paranoia. Patient has fair insight judgment and impulse control.  Loss Factors: Decrease in vocational status and Financial problems/change in socioeconomic status  Historical Factors: Prior suicide attempts, Family history of mental illness or substance abuse and Impulsivity  Risk Reduction Factors:   Religious beliefs about death, Positive therapeutic relationship and Positive coping skills or problem solving skills  Continued Clinical Symptoms:  Depression:   Comorbid alcohol abuse/dependence Recent sense of peace/wellbeing Alcohol/Substance Abuse/Dependencies Unstable or Poor Therapeutic Relationship Previous Psychiatric Diagnoses and Treatments Medical Diagnoses and Treatments/Surgeries  Cognitive Features That Contribute To Risk:  Polarized thinking    Suicide Risk:  Minimal: No identifiable suicidal ideation.  Patients presenting with no risk factors but with morbid ruminations; may be classified as minimal risk based on the severity of the depressive symptoms  Discharge Diagnoses:   AXIS I:  Major Depression, single episode, Substance Abuse, Substance Induced Mood Disorder and Alcohol dependence and cocaine abuse AXIS II:  Deferred AXIS III:   Past Medical History  Diagnosis Date  .  HIV (human immunodeficiency virus infection)   . Chronic systolic heart failure   . Hypertension   . Genital herpes   . Active smoker   . AR (allergic rhinitis)   . HLD (hyperlipidemia)   . NICM (nonischemic cardiomyopathy)     Echocardiogram 06/28/11: EF 30-35%, mild MR, mild LAE;  No CAD by coronary CT angiogram 3/12 at [facility]  . NSVT (nonsustained ventricular tachycardia)   . Depression   . Bipolar 1 disorder   . Alcohol abuse   . Crack cocaine use   . COPD (chronic obstructive pulmonary disease)   . CHF (congestive heart failure)    AXIS IV:  economic problems, housing problems, occupational problems, other psychosocial or environmental problems, problems related to social environment and problems with primary support group AXIS V:  61-70 mild symptoms  Plan Of Care/Follow-up recommendations:  Activity:  As tolerated Diet:  Regular  Is patient on multiple antipsychotic therapies at discharge:  No   Has Patient had three or more failed trials of antipsychotic monotherapy by history:  No  Recommended Plan for Multiple Antipsychotic Therapies: NA  Ryan Long,Ryan R. 11/28/2013, 11:51 AM

## 2013-11-28 NOTE — [facility] Group Notes (Signed)
[facility] LCSW Aftercare Discharge Planning Group Note   11/28/2013 1:17 PM    Participation Quality:  Appropraite  Mood/Affect:  Appropriate  Depression Rating:  7  Anxiety Rating:  7  Thoughts of Suicide:  No  Will you contract for safety?   NA  Current AVH:  No  Plan for Discharge/Comments:  Patient attended discharge planning group and actively participated in group.  He reports he hopes to discharge today.  He advised he and his CSW are working on a discharge plan.  CSW provided all participants with daily workbook.   Transportation Means: Patient has transportation.   Supports:  Patient has a support system.   Kelliann Pendergraph, [place]es[place] July

## 2013-11-28 NOTE — Tx Team (Signed)
Interdisciplinary Treatment Plan Update (Adult)  Date: 11/28/2013  Time Reviewed:  9:45 AM  Progress in Treatment: Attending groups: Yes Participating in groups:  Yes Taking medication as prescribed:  Yes Tolerating medication:  Yes Family/Significant othe contact made: No, pt refused Patient understands diagnosis:  Yes Discussing patient identified problems/goals with staff:  Yes Medical problems stabilized or resolved:  Yes Denies suicidal/homicidal ideation: Yes Issues/concerns per patient self-inventory:  Yes Other:  New problem(s) identified: N/A  Discharge Plan or Barriers: Pt will follow up at [facility] for medication management and therapy.    Reason for Continuation of Hospitalization: Stable to d/c today  Comments: N/A  Estimated length of stay: D/C today  For review of initial/current patient goals, please see plan of care.  Attendees: Patient:  Ryan Long  11/28/2013 10:43 AM   Family:     Physician:  Dr. Javier Glazier 11/28/2013 10:35 AM   Nursing:   Audie Box, RN 11/28/2013 10:35 AM   Clinical Social Worker:  Reyes Ivan, LCSW 11/28/2013 10:35 AM   Other: Verne Spurr, PA 11/28/2013 10:35 AM   Other:  Lamount Cranker, RN 11/28/2013 10:35 AM   Other:  Juline Patch, LCSW 11/28/2013 10:35 AM   Other:     Other:    Other:    Other:    Other:    Other:    Other:     Scribe for Treatment Team:   Carmina Miller, 11/28/2013 10:35 AM

## 2013-11-28 NOTE — Progress Notes (Signed)
D: Pt in bed resting with eyes closed. Respirations even and unlabored. Pt appears to be in no signs of distress at this time. A: Q15min checks remains for this pt. R: Pt remains safe at this time.   

## 2013-11-28 NOTE — Progress Notes (Signed)
[facility] Adult Case Management Discharge Plan :  Will you be returning to the same living situation after discharge: Yes,  pt was homeless prior to admission and will continue to be homeless at discharge.  Pt was provided resources in the community for homeless, such as [facility] and shelters.  Pt is aware of options and also spoke with [name], homeless liasion for additional support.  Pt chose to go to [facility] to stay at that shelter. At discharge, do you have transportation home?:Yes,  hospital providing pt with a taxi voucher to [facility] due to having a lot of belongings, that would be hard to carry on the bus Do you have the ability to pay for your medications:Yes,  access to meds  Release of information consent forms completed and in the chart;  Patient's signature needed at discharge.  Patient to Follow up at: Follow-up Information   Follow up with [facility] On 12/01/2013. (Walk in on this date for hospital discharge appointment, for medication management and therapy. Walk in clinic is Monday - Friday 8 am - 3 pm.)    Contact information:   [address] [place], [place] [address] Phone: [phone] Fax: [phone]      Patient denies SI/HI:   Yes,  denies SI/HI    Safety Planning and Suicide Prevention discussed:  Yes,  discussed with pt.  Pt refused consent to provide with family/friends.  See suicide prevention education note.   Carmina Miller 11/28/2013, 11:06 AM

## 2013-12-02 ENCOUNTER — Other Ambulatory Visit: Payer: Self-pay | Admitting: Internal Medicine

## 2013-12-02 ENCOUNTER — Other Ambulatory Visit (INDEPENDENT_AMBULATORY_CARE_PROVIDER_SITE_OTHER): Payer: Medicaid Other

## 2013-12-02 DIAGNOSIS — Z79899 Other long term (current) drug therapy: Secondary | ICD-10-CM

## 2013-12-02 DIAGNOSIS — Z113 Encounter for screening for infections with a predominantly sexual mode of transmission: Secondary | ICD-10-CM

## 2013-12-02 DIAGNOSIS — B20 Human immunodeficiency virus [HIV] disease: Secondary | ICD-10-CM

## 2013-12-02 LAB — LIPID PANEL
Cholesterol: 169 mg/dL (ref 0–200)
HDL: 41 mg/dL (ref >39)
Total CHOL/HDL Ratio: 4.1 Ratio
Triglycerides: 118 mg/dL (ref <150)
VLDL: 24 mg/dL (ref 0–40)

## 2013-12-02 LAB — COMPREHENSIVE METABOLIC PANEL
AST: 31 U/L (ref 0–37)
BUN: 19 mg/dL (ref 6–23)
CO2: 26 mEq/L (ref 19–32)
Calcium: 8.9 mg/dL (ref 8.4–10.5)
Chloride: 104 mEq/L (ref 96–112)
Creat: 1.33 mg/dL (ref 0.50–1.35)
Glucose, Bld: 83 mg/dL (ref 70–99)
Sodium: 139 mEq/L (ref 135–145)
Total Protein: 7 g/dL (ref 6.0–8.3)

## 2013-12-03 LAB — CBC WITH DIFFERENTIAL/PLATELET
Basophils Absolute: 0 10*3/uL (ref 0.0–0.1)
Basophils Relative: 2 % — ABNORMAL HIGH (ref 0–1)
Eosinophils Relative: 1 % (ref 0–5)
HCT: 44.1 % (ref 39.0–52.0)
Lymphocytes Relative: 51 % — ABNORMAL HIGH (ref 12–46)
Lymphs Abs: 1.3 10*3/uL (ref 0.7–4.0)
MCHC: 36.1 g/dL — ABNORMAL HIGH (ref 30.0–36.0)
MCV: 92.8 fL (ref 78.0–100.0)
Monocytes Absolute: 0.3 10*3/uL (ref 0.1–1.0)
Monocytes Relative: 11 % (ref 3–12)
Neutro Abs: 0.9 10*3/uL — ABNORMAL LOW (ref 1.7–7.7)
RBC: 4.75 MIL/uL (ref 4.22–5.81)
RDW: 14.6 % (ref 11.5–15.5)

## 2013-12-03 LAB — T-HELPER CELL (CD4) - (RCID CLINIC ONLY): CD4 T Cell Abs: 440 /uL (ref 400–2700)

## 2013-12-03 NOTE — Progress Notes (Signed)
Patient Discharge Instructions:  After Visit Summary (AVS):   Faxed to:  12/03/13 Psychiatric Admission Assessment Note:   Faxed to:  12/03/13 Suicide Risk Assessment - Discharge Assessment:   Faxed to:  12/03/13 Faxed/Sent to the Next Level Care provider:  12/03/13 Faxed to [facility] @ [phone]  Jerelene Redden, 12/03/2013, 4:09 PM

## 2013-12-10 ENCOUNTER — Inpatient Hospital Stay (HOSPITAL_COMMUNITY)
Admission: RE | Admit: 2013-12-10 | Discharge: 2013-12-10 | Disposition: A | Discharge status: Home / Self Care | Payer: Self-pay | Source: Ambulatory Visit

## 2013-12-16 ENCOUNTER — Encounter (HOSPITAL_COMMUNITY): Payer: Self-pay

## 2013-12-22 ENCOUNTER — Other Ambulatory Visit: Payer: Self-pay | Admitting: *Deleted

## 2013-12-22 ENCOUNTER — Ambulatory Visit (INDEPENDENT_AMBULATORY_CARE_PROVIDER_SITE_OTHER): Payer: Self-pay | Admitting: Infectious Disease

## 2013-12-22 ENCOUNTER — Encounter: Payer: Self-pay | Admitting: Infectious Disease

## 2013-12-22 VITALS — BP 103/67 | HR 79 | Temp 97.7°F | Wt 190.2 lb

## 2013-12-22 DIAGNOSIS — J111 Influenza due to unidentified influenza virus with other respiratory manifestations: Secondary | ICD-10-CM

## 2013-12-22 DIAGNOSIS — B20 Human immunodeficiency virus [HIV] disease: Secondary | ICD-10-CM

## 2013-12-22 DIAGNOSIS — Z23 Encounter for immunization: Secondary | ICD-10-CM

## 2013-12-22 DIAGNOSIS — I428 Other cardiomyopathies: Secondary | ICD-10-CM

## 2013-12-22 DIAGNOSIS — N289 Disorder of kidney and ureter, unspecified: Secondary | ICD-10-CM

## 2013-12-22 DIAGNOSIS — J45901 Unspecified asthma with (acute) exacerbation: Secondary | ICD-10-CM

## 2013-12-22 LAB — BASIC METABOLIC PANEL WITH GFR
BUN: 11 mg/dL (ref 6–23)
CALCIUM: 8.9 mg/dL (ref 8.4–10.5)
CO2: 28 mEq/L (ref 19–32)
Chloride: 101 mEq/L (ref 96–112)
Creat: 1.03 mg/dL (ref 0.50–1.35)
GFR, Est Non African American: 83 mL/min
Glucose, Bld: 79 mg/dL (ref 70–99)
POTASSIUM: 3.8 meq/L (ref 3.5–5.3)
SODIUM: 136 meq/L (ref 135–145)

## 2013-12-22 MED ORDER — OSELTAMIVIR PHOSPHATE 75 MG PO CAPS: 75.0000 mg | ORAL_CAPSULE | Freq: Two times a day (BID) | ORAL | Status: DC

## 2013-12-22 MED ORDER — EMTRICITABINE-TENOFOVIR DF 200-300 MG PO TABS: 1.0000 | ORAL_TABLET | Freq: Every day | ORAL | Status: DC

## 2013-12-22 MED ORDER — RALTEGRAVIR POTASSIUM 400 MG PO TABS: 400.0000 mg | ORAL_TABLET | Freq: Two times a day (BID) | ORAL | Status: DC

## 2013-12-22 NOTE — Progress Notes (Signed)
   Subjective:    Patient ID: Ryan Long, male    DOB: 11-Jan-1961, 53 y.o.   MRN: [id number]  HPI  53 year old with HIV, non ischemic cardiomyopathy, PSA, ? RAD walked into [facility] today. He had been seen at [facility] on Friday with c/o of chills, fevers, NP cough, wheezing and was given IV medicine there as wella s steroid and given rx for azithromycin and prednisone but he has filled neither as he was not sure where to have them filled. He claims tohave all of his cardiac meds and his ARVs and his VL is <20 CD4 above 400.    He had previously been taken care of by Dr Philipp Deputy then moved to [facility] and taken care of by [facility] at [facility]. Is on ADAP.   Review of Systems  Constitutional: Positive for fever and fatigue. Negative for chills, diaphoresis, activity change, appetite change and unexpected weight change.  HENT: Negative for congestion, rhinorrhea, sinus pressure, sneezing, sore throat and trouble swallowing.   Eyes: Negative for photophobia and visual disturbance.  Respiratory: Positive for cough, chest tightness, shortness of breath and wheezing. Negative for stridor.   Cardiovascular: Negative for chest pain, palpitations and leg swelling.  Gastrointestinal: Negative for nausea, vomiting, abdominal pain, diarrhea, constipation, blood in stool, abdominal distention and anal bleeding.  Genitourinary: Negative for dysuria, hematuria, flank pain and difficulty urinating.  Musculoskeletal: Negative for arthralgias, back pain, gait problem, joint swelling and myalgias.  Skin: Negative for color change, pallor, rash and wound.  Neurological: Negative for dizziness, tremors, weakness and light-headedness.  Hematological: Negative for adenopathy. Does not bruise/bleed easily.  Psychiatric/Behavioral: Negative for behavioral problems, confusion, sleep disturbance, dysphoric mood, decreased concentration and agitation.       Objective:   Physical Exam  Constitutional: He is oriented to  person, place, and time. He appears well-developed and well-nourished. No distress.  HENT:  Head: Normocephalic and atraumatic.  Mouth/Throat: Oropharynx is clear and moist. No oropharyngeal exudate.  Eyes: Conjunctivae and EOM are normal. No scleral icterus.  Neck: Normal range of motion. Neck supple.  Cardiovascular: Normal rate, regular rhythm and normal heart sounds.  Exam reveals no gallop and no friction rub.   No murmur heard. Pulmonary/Chest: Effort normal. No respiratory distress. He has wheezes. He has rales. He exhibits no tenderness.  Abdominal: He exhibits no distension. There is no tenderness. There is no rebound.  Neurological: He is alert and oriented to person, place, and time. He exhibits normal muscle tone. Coordination normal.  Skin: Skin is warm and dry. He is not diaphoretic. No erythema. No pallor.  Psychiatric: He has a normal mood and affect. His behavior is normal. Judgment and thought content normal.          Assessment & Plan:   HIV: perfect control, continue [place] and truvada  Cough, chills: was not tested for flu at [facility]. Will give him tamiflu and encouraged him to also fill his prednisone and azithromycin. ? Have component of CHF here. He is going to see cardiologist in next 2 weeks. I spent greater than 45 minutes with the patient including greater than 50% of time in face to face counsel of the patient and in coordination of their care.  NICM: as above and will need to make sure he gets all of his cardiac meds  ARI: cr up in mid December could be due to diuretics, recheck labs today  Need for flu vaccine; given today

## 2013-12-22 NOTE — Patient Instructions (Signed)
We will recheck blood work today  I am sending in rx for tamiflu to [facility] on [address] should be able to also fill the azithromycin and prednisone there that was rx at [facility]

## 2013-12-26 ENCOUNTER — Ambulatory Visit: Payer: Self-pay | Admitting: Internal Medicine

## 2013-12-26 ENCOUNTER — Ambulatory Visit: Payer: Self-pay

## 2013-12-28 ENCOUNTER — Encounter (HOSPITAL_COMMUNITY): Payer: Self-pay | Admitting: Emergency Medicine

## 2013-12-28 ENCOUNTER — Emergency Department (HOSPITAL_COMMUNITY)
Admission: EM | Admit: 2013-12-28 | Discharge: 2013-12-30 | Disposition: A | Discharge status: Home / Self Care | Payer: No Typology Code available for payment source | Attending: Emergency Medicine | Admitting: Emergency Medicine

## 2013-12-28 DIAGNOSIS — I5022 Chronic systolic (congestive) heart failure: Secondary | ICD-10-CM | POA: Insufficient documentation

## 2013-12-28 DIAGNOSIS — Z8619 Personal history of other infectious and parasitic diseases: Secondary | ICD-10-CM | POA: Insufficient documentation

## 2013-12-28 DIAGNOSIS — Z862 Personal history of diseases of the blood and blood-forming organs and certain disorders involving the immune mechanism: Secondary | ICD-10-CM | POA: Insufficient documentation

## 2013-12-28 DIAGNOSIS — Z79899 Other long term (current) drug therapy: Secondary | ICD-10-CM | POA: Insufficient documentation

## 2013-12-28 DIAGNOSIS — F319 Bipolar disorder, unspecified: Secondary | ICD-10-CM | POA: Insufficient documentation

## 2013-12-28 DIAGNOSIS — J449 Chronic obstructive pulmonary disease, unspecified: Secondary | ICD-10-CM | POA: Insufficient documentation

## 2013-12-28 DIAGNOSIS — Z8639 Personal history of other endocrine, nutritional and metabolic disease: Secondary | ICD-10-CM | POA: Insufficient documentation

## 2013-12-28 DIAGNOSIS — Z7982 Long term (current) use of aspirin: Secondary | ICD-10-CM | POA: Insufficient documentation

## 2013-12-28 DIAGNOSIS — R45851 Suicidal ideations: Secondary | ICD-10-CM | POA: Insufficient documentation

## 2013-12-28 DIAGNOSIS — F17200 Nicotine dependence, unspecified, uncomplicated: Secondary | ICD-10-CM | POA: Insufficient documentation

## 2013-12-28 DIAGNOSIS — I10 Essential (primary) hypertension: Secondary | ICD-10-CM | POA: Insufficient documentation

## 2013-12-28 DIAGNOSIS — Z21 Asymptomatic human immunodeficiency virus [HIV] infection status: Secondary | ICD-10-CM | POA: Insufficient documentation

## 2013-12-28 DIAGNOSIS — R6889 Other general symptoms and signs: Secondary | ICD-10-CM | POA: Insufficient documentation

## 2013-12-28 DIAGNOSIS — J4489 Other specified chronic obstructive pulmonary disease: Secondary | ICD-10-CM | POA: Insufficient documentation

## 2013-12-28 DIAGNOSIS — Z9109 Other allergy status, other than to drugs and biological substances: Secondary | ICD-10-CM | POA: Insufficient documentation

## 2013-12-28 LAB — COMPREHENSIVE METABOLIC PANEL
ALBUMIN: 3.4 g/dL — AB (ref 3.5–5.2)
ALT: 61 U/L — ABNORMAL HIGH (ref 0–53)
AST: 54 U/L — ABNORMAL HIGH (ref 0–37)
Alkaline Phosphatase: 50 U/L (ref 39–117)
BUN: 14 mg/dL (ref 6–23)
CO2: 24 mEq/L (ref 19–32)
Calcium: 8.4 mg/dL (ref 8.4–10.5)
Chloride: 101 mEq/L (ref 96–112)
Creatinine, Ser: 1.07 mg/dL (ref 0.50–1.35)
GFR calc Af Amer: >90 mL/min (ref >90)
GFR calc non Af Amer: 78 mL/min — ABNORMAL LOW (ref >90)
Glucose, Bld: 136 mg/dL — ABNORMAL HIGH (ref 70–99)
POTASSIUM: 3.5 meq/L — AB (ref 3.7–5.3)
SODIUM: 137 meq/L (ref 137–147)
TOTAL PROTEIN: 6.4 g/dL (ref 6.0–8.3)
Total Bilirubin: 0.9 mg/dL (ref 0.3–1.2)

## 2013-12-28 LAB — RAPID URINE DRUG SCREEN, HOSP PERFORMED
AMPHETAMINES: NOT DETECTED
BARBITURATES: NOT DETECTED
BENZODIAZEPINES: NOT DETECTED
COCAINE: POSITIVE — AB
Opiates: NOT DETECTED
TETRAHYDROCANNABINOL: POSITIVE — AB

## 2013-12-28 LAB — ETHANOL: Alcohol, Ethyl (B): <11 mg/dL (ref 0–11)

## 2013-12-28 LAB — CBC
HCT: 44.9 % (ref 39.0–52.0)
HEMOGLOBIN: 16.4 g/dL (ref 13.0–17.0)
MCH: 33.7 pg (ref 26.0–34.0)
MCHC: 36.5 g/dL — AB (ref 30.0–36.0)
MCV: 92.4 fL (ref 78.0–100.0)
Platelets: 175 10*3/uL (ref 150–400)
RBC: 4.86 MIL/uL (ref 4.22–5.81)
RDW: 13.6 % (ref 11.5–15.5)
WBC: 2.7 10*3/uL — AB (ref 4.0–10.5)

## 2013-12-28 LAB — SALICYLATE LEVEL: Salicylate Lvl: <2 mg/dL — ABNORMAL LOW (ref 2.8–20.0)

## 2013-12-28 LAB — ACETAMINOPHEN LEVEL

## 2013-12-28 LAB — DIGOXIN LEVEL: Digoxin Level: 0.4 ng/mL — ABNORMAL LOW (ref 0.8–2.0)

## 2013-12-28 MED ORDER — ASPIRIN 81 MG PO CHEW
81.0000 mg | CHEWABLE_TABLET | Freq: Every day | ORAL | Status: DC
  Administered 2013-12-28 – 2013-12-29 (×2): 81 mg via ORAL
  Filled 2013-12-28 (×2): qty 1

## 2013-12-28 MED ORDER — LORAZEPAM 1 MG PO TABS: 1.0000 mg | ORAL_TABLET | Freq: Three times a day (TID) | ORAL | Status: DC | PRN

## 2013-12-28 MED ORDER — HYDRALAZINE HCL 25 MG PO TABS
25.0000 mg | ORAL_TABLET | Freq: Two times a day (BID) | ORAL | Status: DC
  Administered 2013-12-29 (×2): 25 mg via ORAL
  Filled 2013-12-28 (×3): qty 1

## 2013-12-28 MED ORDER — ALUM & MAG HYDROXIDE-SIMETH 200-200-20 MG/5ML PO SUSP: 30.0000 mL | ORAL | Status: DC | PRN

## 2013-12-28 MED ORDER — LISINOPRIL 5 MG PO TABS
5.0000 mg | ORAL_TABLET | Freq: Every day | ORAL | Status: DC
  Administered 2013-12-29: 5 mg via ORAL
  Filled 2013-12-28 (×2): qty 1

## 2013-12-28 MED ORDER — ONDANSETRON HCL 4 MG PO TABS: 4.0000 mg | ORAL_TABLET | Freq: Three times a day (TID) | ORAL | Status: DC | PRN

## 2013-12-28 MED ORDER — SPIRONOLACTONE 12.5 MG HALF TABLET
12.5000 mg | ORAL_TABLET | Freq: Every day | ORAL | Status: DC
  Administered 2013-12-28 – 2013-12-29 (×2): 12.5 mg via ORAL
  Filled 2013-12-28 (×2): qty 1

## 2013-12-28 MED ORDER — IBUPROFEN 200 MG PO TABS: 600.0000 mg | ORAL_TABLET | Freq: Three times a day (TID) | ORAL | Status: DC | PRN

## 2013-12-28 MED ORDER — RALTEGRAVIR POTASSIUM 400 MG PO TABS
400.0000 mg | ORAL_TABLET | Freq: Two times a day (BID) | ORAL | Status: DC
  Administered 2013-12-28 – 2013-12-29 (×3): 400 mg via ORAL
  Filled 2013-12-28 (×3): qty 1

## 2013-12-28 MED ORDER — ASPIRIN 81 MG PO TABS: 81.0000 mg | ORAL_TABLET | Freq: Every day | ORAL | Status: DC

## 2013-12-28 MED ORDER — ISOSORBIDE DINITRATE 5 MG PO TABS
5.0000 mg | ORAL_TABLET | Freq: Every day | ORAL | Status: DC
  Administered 2013-12-28 – 2013-12-29 (×2): 5 mg via ORAL
  Filled 2013-12-28 (×2): qty 1

## 2013-12-28 MED ORDER — DIGOXIN 125 MCG PO TABS
125.0000 ug | ORAL_TABLET | Freq: Every day | ORAL | Status: DC
  Administered 2013-12-28 – 2013-12-29 (×2): 125 ug via ORAL
  Filled 2013-12-28 (×2): qty 1

## 2013-12-28 MED ORDER — ZOLPIDEM TARTRATE 5 MG PO TABS
5.0000 mg | ORAL_TABLET | Freq: Every evening | ORAL | Status: DC | PRN
  Administered 2013-12-28 – 2013-12-29 (×2): 5 mg via ORAL
  Filled 2013-12-28 (×2): qty 1

## 2013-12-28 MED ORDER — EMTRICITABINE-TENOFOVIR DF 200-300 MG PO TABS
1.0000 | ORAL_TABLET | Freq: Every day | ORAL | Status: DC
  Administered 2013-12-28 – 2013-12-29 (×2): 1 via ORAL
  Filled 2013-12-28 (×2): qty 1

## 2013-12-28 MED ORDER — FUROSEMIDE 40 MG PO TABS
40.0000 mg | ORAL_TABLET | Freq: Every day | ORAL | Status: DC
  Administered 2013-12-29: 40 mg via ORAL
  Filled 2013-12-28 (×2): qty 1

## 2013-12-28 MED ORDER — NICOTINE 21 MG/24HR TD PT24
21.0000 mg | MEDICATED_PATCH | Freq: Every day | TRANSDERMAL | Status: DC
  Administered 2013-12-28 – 2013-12-29 (×2): 21 mg via TRANSDERMAL
  Filled 2013-12-28 (×2): qty 1

## 2013-12-28 MED ORDER — METOPROLOL SUCCINATE ER 50 MG PO TB24
150.0000 mg | ORAL_TABLET | Freq: Every day | ORAL | Status: DC
  Administered 2013-12-29: 150 mg via ORAL
  Filled 2013-12-28 (×2): qty 1

## 2013-12-28 NOTE — ED Notes (Signed)
Per pt, states he wants to end his life, wants to jump out in front of car, has been treated for mental illness in past-has not been taking meds

## 2013-12-28 NOTE — BH Assessment (Signed)
Spoke to intake staff at [facility] who said Pt was in their emergency department today with a similar presentation. He was evaluated by their psychiatrist, Dr. Otelia Santee, who felt Pt was malingering due to lack of shelter and Pt was discharged with outpatient referrals.  Harlin Rain Ria Comment, [facility] Triage Specialist

## 2013-12-28 NOTE — BH Assessment (Signed)
Received call for tele-assessment. Spoke with Candyce Churn, MD who says Pt reports SI and denies using substances which is not congruent with UDS. Tele-assessment will be initiated.  Harlin Rain Ria Comment, [facility] Triage Specialist

## 2013-12-28 NOTE — ED Provider Notes (Signed)
CSN: [id number]     Arrival date & time 12/28/13  1831 History   First MD Initiated Contact with Patient 12/28/13 1911     Chief Complaint  Patient presents with  . Medical Clearance   (Consider location/radiation/quality/duration/timing/severity/associated sxs/prior Treatment) Patient is a 53 y.o. male presenting with mental health disorder.  Mental Health Problem Presenting symptoms: suicidal thoughts   Degree of incapacity (severity):  Severe Onset quality:  Gradual Duration:  3 days Timing:  Constant Progression:  Worsening Chronicity:  Recurrent Context: not alcohol use, not drug abuse and not noncompliant   Relieved by:  Nothing Worsened by:  Nothing tried Associated symptoms: anhedonia and feelings of worthlessness   Associated symptoms: no abdominal pain and no chest pain     Past Medical History  Diagnosis Date  . HIV (human immunodeficiency virus infection)   . Chronic systolic heart failure   . Hypertension   . Genital herpes   . Active smoker   . AR (allergic rhinitis)   . HLD (hyperlipidemia)   . NICM (nonischemic cardiomyopathy)     Echocardiogram 06/28/11: EF 30-35%, mild MR, mild LAE;  No CAD by coronary CT [facility] 3[facility]  . NSVT (nonsustained ventricular tachycardia)   . Depression   . Bipolar 1 disorder   . Alcohol abuse   . Crack cocaine use   . COPD (chronic obstructive pulmonary disease)   . CHF (congestive heart failure)    History reviewed. No pertinent past surgical history. No family history on file. History  Substance Use Topics  . Smoking status: Current Every Day Smoker -- 0.80 packs/day for 30 years    Types: Cigarettes  . Smokeless tobacco: Never Used     Comment: 10 ciggerettes daily  . Alcohol Use: Yes     Comment: 4 to 5 beers daily and 2 5ths of wine    Review of Systems  Constitutional: Negative for fever.  HENT: Negative for congestion.   Respiratory: Negative for cough and shortness of breath.    Cardiovascular: Negative for chest pain.  Gastrointestinal: Negative for nausea, vomiting, abdominal pain and diarrhea.  Psychiatric/Behavioral: Positive for suicidal ideas.  All other systems reviewed and are negative.    Allergies  Review of patient's allergies indicates no known allergies.  Home Medications   Current Outpatient Rx  Name  Route  Sig  Dispense  Refill  . albuterol (PROVENTIL HFA;VENTOLIN HFA) 108 (90 BASE) MCG/ACT inhaler   Inhalation   Inhale 2 puffs into the lungs as needed for shortness of breath. Per pt med is as needed with no frequency for asthma.   1 Inhaler   0   . aspirin 81 MG tablet   Oral   Take 1 tablet (81 mg total) by mouth daily. For decreased platelet aggregation.   30 tablet   0   . buPROPion (WELLBUTRIN XL) 150 MG 24 hr tablet   Oral   Take 2 tablets (300 mg total) by mouth daily. For depression.   30 tablet   0   . digoxin (LANOXIN) 0.125 MG tablet   Oral   Take 1 tablet (125 mcg total) by mouth daily. For control of heart arrythmia.         Marland Kitchen. emtricitabine-tenofovir (TRUVADA) 200-300 MG per tablet   Oral   Take 1 tablet by mouth daily. For HIV   30 tablet   6   . furosemide (LASIX) 40 MG tablet   Oral   Take 1 tablet (  40 mg total) by mouth daily. For edema   30 tablet   0   . hydrALAZINE (APRESOLINE) 25 MG tablet   Oral   Take 25 mg by mouth 2 (two) times daily.         . hydrOXYzine (ATARAX/VISTARIL) 25 MG tablet   Oral   Take 2 tablets (50 mg total) by mouth 3 (three) times daily as needed for anxiety.   30 tablet   0   . isosorbide dinitrate (ISORDIL) 10 MG tablet   Oral   Take 0.5 tablets (5 mg total) by mouth daily.         Marland Kitchen lisinopril (PRINIVIL,ZESTRIL) 5 MG tablet   Oral   Take 1 tablet (5 mg total) by mouth daily. For hypertension         . metoprolol succinate (TOPROL-XL) 50 MG 24 hr tablet   Oral   Take 3 tablets (150 mg total) by mouth daily. Take with or immediately following a meal.          . oseltamivir (TAMIFLU) 75 MG capsule   Oral   Take 1 capsule (75 mg total) by mouth 2 (two) times daily.   10 capsule   0   . raltegravir (ISENTRESS) 400 MG tablet   Oral   Take 1 tablet (400 mg total) by mouth 2 (two) times daily. For HIV   60 tablet   6   . spironolactone (ALDACTONE) 25 MG tablet   Oral   Take 12.5 mg by mouth daily.         . traZODone (DESYREL) 150 MG tablet   Oral   Take 1 tablet (150 mg total) by mouth at bedtime. For sleep.         . valACYclovir (VALTREX) 500 MG tablet   Oral   Take 1 tablet (500 mg total) by mouth 2 (two) times daily. For Herpes.          BP 110/71  Pulse 74  Temp(Src) 98.2 F (36.8 C) (Oral)  Resp 16  SpO2 97% Physical Exam  Nursing note and vitals reviewed. Constitutional: He is oriented to person, place, and time. He appears well-developed and well-nourished. No distress.  HENT:  Head: Normocephalic and atraumatic.  Eyes: Conjunctivae are normal. No scleral icterus.  Neck: Neck supple.  Cardiovascular: Normal rate and intact distal pulses.   Pulmonary/Chest: Effort normal. No stridor. No respiratory distress.  Abdominal: Normal appearance. He exhibits no distension.  Neurological: He is alert and oriented to person, place, and time.  Skin: Skin is warm and dry. No rash noted.  Psychiatric: He has a normal mood and affect. His speech is normal and behavior is normal. He expresses suicidal ideation.    ED Course  Procedures (including critical care time) Labs Review Labs Reviewed  CBC - Abnormal; Notable for the following:    WBC 2.7 (*)    MCHC 36.5 (*)    All other components within normal limits  COMPREHENSIVE METABOLIC PANEL - Abnormal; Notable for the following:    Potassium 3.5 (*)    Glucose, Bld 136 (*)    Albumin 3.4 (*)    AST 54 (*)    ALT 61 (*)    GFR calc non Af Amer 78 (*)    All other components within normal limits  SALICYLATE LEVEL - Abnormal; Notable for the following:     Salicylate Lvl <2.0 (*)    All other components within normal limits  URINE RAPID DRUG SCREEN (  HOSP PERFORMED) - Abnormal; Notable for the following:    Cocaine POSITIVE (*)    Tetrahydrocannabinol POSITIVE (*)    All other components within normal limits  ACETAMINOPHEN LEVEL  ETHANOL   Imaging Review No results found.  EKG Interpretation   None       MDM   1. Suicidal ideation    54 yo male with SI with plan to jump in front of traffic.  This is a recurrent problem for him.  He has no symptoms of organic disease.  Plan TTS consult.      Candyce Churn, MD 12/28/13 2005
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
## 2013-12-28 NOTE — BH Assessment (Signed)
Assessment complete. Per Rosey Bath, [facility] at [facility], adult unit is at capacity. Consulted with Alberteen Sam, NP who agrees Pt meets criteria for inpatient psychiatric crisis stabilization. Consulted with Candyce Churn, MD who agrees with recommendation. TTS will contact other facilities for placement.  Harlin Rain Ria Comment, [facility] Triage Specialist

## 2013-12-29 DIAGNOSIS — F1010 Alcohol abuse, uncomplicated: Secondary | ICD-10-CM

## 2013-12-29 DIAGNOSIS — F329 Major depressive disorder, single episode, unspecified: Secondary | ICD-10-CM

## 2013-12-29 DIAGNOSIS — R45851 Suicidal ideations: Secondary | ICD-10-CM

## 2013-12-29 MED ORDER — HYDROXYZINE HCL 25 MG PO TABS: 25.0000 mg | ORAL_TABLET | Freq: Three times a day (TID) | ORAL | Status: DC | PRN

## 2013-12-29 MED ORDER — BUPROPION HCL ER (XL) 150 MG PO TB24
150.0000 mg | ORAL_TABLET | Freq: Every day | ORAL | Status: DC
  Administered 2013-12-29: 150 mg via ORAL
  Filled 2013-12-29: qty 1

## 2013-12-29 NOTE — Progress Notes (Signed)
[facility] provided pt with a [facility] Orange Card application, highlighting [facility].  °

## 2013-12-29 NOTE — Progress Notes (Signed)
Underwriter initiated inpatient treatment for pt.  The following hospitals were contacted and faxed referral with bed availability: 1)[facility]-disposition prior to faxing tx for MH, UA positive for drugs but is not requesting detox 2)[facility] 3)[facility] 4)[facility] 8)[facility]  Blain Pais, MHT/NS
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
## 2013-12-30 ENCOUNTER — Encounter (HOSPITAL_COMMUNITY): Payer: Self-pay | Admitting: *Deleted

## 2013-12-30 ENCOUNTER — Inpatient Hospital Stay (HOSPITAL_COMMUNITY)
Admission: AD | Admit: 2013-12-30 | Discharge: 2014-01-02 | DRG: 885 | Disposition: A | Discharge status: Home / Self Care | Payer: Medicaid Other | Source: Intra-hospital | Attending: Psychiatry | Admitting: Psychiatry

## 2013-12-30 DIAGNOSIS — F411 Generalized anxiety disorder: Secondary | ICD-10-CM | POA: Diagnosis present

## 2013-12-30 DIAGNOSIS — F329 Major depressive disorder, single episode, unspecified: Secondary | ICD-10-CM

## 2013-12-30 DIAGNOSIS — J111 Influenza due to unidentified influenza virus with other respiratory manifestations: Secondary | ICD-10-CM

## 2013-12-30 DIAGNOSIS — R45851 Suicidal ideations: Secondary | ICD-10-CM

## 2013-12-30 DIAGNOSIS — E785 Hyperlipidemia, unspecified: Secondary | ICD-10-CM

## 2013-12-30 DIAGNOSIS — I428 Other cardiomyopathies: Secondary | ICD-10-CM

## 2013-12-30 DIAGNOSIS — I5022 Chronic systolic (congestive) heart failure: Secondary | ICD-10-CM

## 2013-12-30 DIAGNOSIS — F1994 Other psychoactive substance use, unspecified with psychoactive substance-induced mood disorder: Secondary | ICD-10-CM

## 2013-12-30 DIAGNOSIS — F1920 Other psychoactive substance dependence, uncomplicated: Secondary | ICD-10-CM

## 2013-12-30 DIAGNOSIS — Z21 Asymptomatic human immunodeficiency virus [HIV] infection status: Secondary | ICD-10-CM | POA: Diagnosis present

## 2013-12-30 DIAGNOSIS — F17200 Nicotine dependence, unspecified, uncomplicated: Secondary | ICD-10-CM | POA: Diagnosis present

## 2013-12-30 DIAGNOSIS — F319 Bipolar disorder, unspecified: Secondary | ICD-10-CM | POA: Diagnosis present

## 2013-12-30 DIAGNOSIS — B009 Herpesviral infection, unspecified: Secondary | ICD-10-CM

## 2013-12-30 DIAGNOSIS — F332 Major depressive disorder, recurrent severe without psychotic features: Principal | ICD-10-CM

## 2013-12-30 DIAGNOSIS — F1410 Cocaine abuse, uncomplicated: Secondary | ICD-10-CM

## 2013-12-30 DIAGNOSIS — I10 Essential (primary) hypertension: Secondary | ICD-10-CM | POA: Diagnosis present

## 2013-12-30 DIAGNOSIS — J4489 Other specified chronic obstructive pulmonary disease: Secondary | ICD-10-CM | POA: Diagnosis present

## 2013-12-30 DIAGNOSIS — J309 Allergic rhinitis, unspecified: Secondary | ICD-10-CM

## 2013-12-30 DIAGNOSIS — J449 Chronic obstructive pulmonary disease, unspecified: Secondary | ICD-10-CM | POA: Diagnosis present

## 2013-12-30 DIAGNOSIS — F1020 Alcohol dependence, uncomplicated: Secondary | ICD-10-CM

## 2013-12-30 DIAGNOSIS — F39 Unspecified mood [affective] disorder: Secondary | ICD-10-CM | POA: Insufficient documentation

## 2013-12-30 DIAGNOSIS — Z79899 Other long term (current) drug therapy: Secondary | ICD-10-CM

## 2013-12-30 DIAGNOSIS — B20 Human immunodeficiency virus [HIV] disease: Secondary | ICD-10-CM

## 2013-12-30 DIAGNOSIS — I509 Heart failure, unspecified: Secondary | ICD-10-CM | POA: Diagnosis present

## 2013-12-30 MED ORDER — HYDRALAZINE HCL 25 MG PO TABS
25.0000 mg | ORAL_TABLET | Freq: Two times a day (BID) | ORAL | Status: DC
  Administered 2013-12-30 – 2014-01-02 (×6): 25 mg via ORAL
  Filled 2013-12-30 (×11): qty 1

## 2013-12-30 MED ORDER — FUROSEMIDE 40 MG PO TABS
40.0000 mg | ORAL_TABLET | Freq: Every day | ORAL | Status: DC
  Administered 2013-12-30 – 2014-01-02 (×4): 40 mg via ORAL
  Filled 2013-12-30 (×6): qty 1

## 2013-12-30 MED ORDER — ALUM & MAG HYDROXIDE-SIMETH 200-200-20 MG/5ML PO SUSP: 30.0000 mL | ORAL | Status: DC | PRN

## 2013-12-30 MED ORDER — VALACYCLOVIR HCL 500 MG PO TABS
500.0000 mg | ORAL_TABLET | Freq: Two times a day (BID) | ORAL | Status: DC
  Administered 2013-12-30 – 2014-01-02 (×7): 500 mg via ORAL
  Filled 2013-12-30 (×11): qty 1

## 2013-12-30 MED ORDER — SPIRONOLACTONE 12.5 MG HALF TABLET
12.5000 mg | ORAL_TABLET | Freq: Every day | ORAL | Status: DC
  Administered 2013-12-30 – 2014-01-02 (×4): 12.5 mg via ORAL
  Filled 2013-12-30 (×6): qty 1

## 2013-12-30 MED ORDER — NICOTINE 14 MG/24HR TD PT24
14.0000 mg | MEDICATED_PATCH | Freq: Every day | TRANSDERMAL | Status: DC
  Administered 2013-12-30 – 2014-01-02 (×4): 14 mg via TRANSDERMAL
  Filled 2013-12-30 (×7): qty 1

## 2013-12-30 MED ORDER — MAGNESIUM HYDROXIDE 400 MG/5ML PO SUSP: 30.0000 mL | Freq: Every day | ORAL | Status: DC | PRN

## 2013-12-30 MED ORDER — ASPIRIN 81 MG PO CHEW
81.0000 mg | CHEWABLE_TABLET | Freq: Every day | ORAL | Status: DC
  Administered 2013-12-30 – 2014-01-02 (×4): 81 mg via ORAL
  Filled 2013-12-30 (×6): qty 1

## 2013-12-30 MED ORDER — EMTRICITABINE-TENOFOVIR DF 200-300 MG PO TABS
1.0000 | ORAL_TABLET | Freq: Every day | ORAL | Status: DC
  Administered 2013-12-30 – 2014-01-02 (×4): 1 via ORAL
  Filled 2013-12-30 (×6): qty 1

## 2013-12-30 MED ORDER — ISOSORBIDE DINITRATE 5 MG PO TABS: 5.0000 mg | ORAL_TABLET | Freq: Every day | ORAL | Status: DC

## 2013-12-30 MED ORDER — TRAZODONE HCL 150 MG PO TABS
150.0000 mg | ORAL_TABLET | Freq: Every day | ORAL | Status: DC
  Administered 2013-12-30 – 2014-01-01 (×3): 150 mg via ORAL
  Filled 2013-12-30: qty 1
  Filled 2013-12-30: qty 3
  Filled 2013-12-30 (×4): qty 1

## 2013-12-30 MED ORDER — BUPROPION HCL ER (XL) 300 MG PO TB24
300.0000 mg | ORAL_TABLET | Freq: Every day | ORAL | Status: DC
  Administered 2013-12-30 – 2014-01-02 (×4): 300 mg via ORAL
  Filled 2013-12-30 (×3): qty 1
  Filled 2013-12-30: qty 3
  Filled 2013-12-30 (×2): qty 1

## 2013-12-30 MED ORDER — LISINOPRIL 5 MG PO TABS
5.0000 mg | ORAL_TABLET | Freq: Every day | ORAL | Status: DC
  Administered 2013-12-30 – 2014-01-02 (×4): 5 mg via ORAL
  Filled 2013-12-30 (×6): qty 1

## 2013-12-30 MED ORDER — DIGOXIN 125 MCG PO TABS
125.0000 ug | ORAL_TABLET | Freq: Every day | ORAL | Status: DC
  Administered 2013-12-30 – 2014-01-02 (×4): 125 ug via ORAL
  Filled 2013-12-30 (×6): qty 1

## 2013-12-30 MED ORDER — ISOSORBIDE DINITRATE 5 MG PO TABS: 2.5000 mg | ORAL_TABLET | Freq: Two times a day (BID) | ORAL | Status: DC

## 2013-12-30 MED ORDER — ISOSORBIDE DINITRATE 5 MG PO TABS
5.0000 mg | ORAL_TABLET | Freq: Three times a day (TID) | ORAL | Status: DC
  Administered 2013-12-30 – 2014-01-02 (×9): 5 mg via ORAL
  Filled 2013-12-30 (×16): qty 1

## 2013-12-30 MED ORDER — IBUPROFEN 800 MG PO TABS
800.0000 mg | ORAL_TABLET | Freq: Three times a day (TID) | ORAL | Status: DC | PRN
  Administered 2013-12-30 – 2014-01-02 (×5): 800 mg via ORAL
  Filled 2013-12-30 (×5): qty 1

## 2013-12-30 MED ORDER — ALBUTEROL SULFATE HFA 108 (90 BASE) MCG/ACT IN AERS: 2.0000 | INHALATION_SPRAY | RESPIRATORY_TRACT | Status: DC | PRN

## 2013-12-30 MED ORDER — HYDROXYZINE HCL 50 MG PO TABS
50.0000 mg | ORAL_TABLET | Freq: Three times a day (TID) | ORAL | Status: DC | PRN
  Administered 2013-12-30 – 2014-01-01 (×3): 50 mg via ORAL
  Filled 2013-12-30 (×2): qty 1
  Filled 2013-12-30: qty 6
  Filled 2013-12-30: qty 1

## 2013-12-30 MED ORDER — RALTEGRAVIR POTASSIUM 400 MG PO TABS
400.0000 mg | ORAL_TABLET | Freq: Two times a day (BID) | ORAL | Status: DC
  Administered 2013-12-30 – 2014-01-02 (×6): 400 mg via ORAL
  Filled 2013-12-30 (×11): qty 1

## 2013-12-30 MED ORDER — ACETAMINOPHEN 325 MG PO TABS
650.0000 mg | ORAL_TABLET | Freq: Four times a day (QID) | ORAL | Status: DC | PRN
  Administered 2013-12-30: 650 mg via ORAL
  Filled 2013-12-30: qty 2

## 2013-12-30 MED ORDER — METOPROLOL SUCCINATE ER 50 MG PO TB24
150.0000 mg | ORAL_TABLET | Freq: Every day | ORAL | Status: DC
  Administered 2013-12-30 – 2014-01-02 (×4): 150 mg via ORAL
  Filled 2013-12-30 (×6): qty 1

## 2013-12-30 NOTE — [facility] Suicide Risk Assessment (Signed)
[facility] INPATIENT:  Family/Significant Other Suicide Prevention Education  Suicide Prevention Education:  Patient Refusal for Family/Significant Other Suicide Prevention Education: The patient Ryan Long has refused to provide written consent for family/significant other to be provided Family/Significant Other Suicide Prevention Education during admission and/or prior to discharge.  Physician notified.  Patient advised of no family involvement.  Wynn Banker 12/30/2013, 3:23 PM

## 2013-12-30 NOTE — [facility] Counselor (Signed)
Adult Psychosocial Assessment Update Interdisciplinary Team  Previous [facility] admissions/discharges:  Admissions Discharges  Date: 11/22/13 Date:11/28/13  Date: Date:  Date: Date:  Date: Date:  Date: Date:   Changes since the last Psychosocial Assessment (including adherence to outpatient mental health and/or substance abuse treatment, situational issues contributing to decompensation and/or relapse). Patient endorses increased depression and SI with plan.  He advised of relapsing on crack cocaine, alcohol and THC.             Discharge Plan 1. Will you be returning to the same living situation after discharge?   Yes: No:      If no, what is your plan?    Yes, patient plans to return to [facility] in [place] at discharge.       2. Would you like a referral for services when you are discharged? Yes:     If yes, for what services?  No:       No, patient reports he is being followed outpatient by [facility]>       Summary and Recommendations (to be completed by the evaluator)  Ryan Long is a 53 years old African American male admitted with Major Depression Disorder.  He will benefit from crisis stabilization, evaluation for medication, psycho-education groups for coping skills development, group therapy and case management for discharge planning.                        Signature:  Wynn Banker, 12/30/2013 11:47 AM

## 2013-12-30 NOTE — Tx Team (Signed)
Initial Interdisciplinary Treatment Plan  PATIENT STRENGTHS: (choose at least two) Average or above average intelligence Capable of independent living Communication skills General fund of knowledge Motivation for treatment/growth  PATIENT STRESSORS: Financial difficulties Health problems Substance abuse   PROBLEM LIST: Problem List/Patient Goals Date to be addressed Date deferred Reason deferred Estimated date of resolution  Depression- feeling hopeless      Risk for self harm- having thoughts to jump in front of a moving car      Health issues- HIV and genital herpes hx      Homeless- living in a shelter                                     DISCHARGE CRITERIA:  Ability to meet basic life and health needs Adequate post-discharge living arrangements Improved stabilization in mood, thinking, and/or behavior Motivation to continue treatment in a less acute level of care Safe-care adequate arrangements made Verbal commitment to aftercare and medication compliance  PRELIMINARY DISCHARGE PLAN: Attend aftercare/continuing care group Outpatient therapy Return to previous living arrangement  PATIENT/FAMIILY INVOLVEMENT: This treatment plan has been presented to and reviewed with the patient, Ryan Long, and/or family member.  The patient and family have been given the opportunity to ask questions and make suggestions.  Jesus Genera [facility] 12/30/2013, 1:57 AM
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
## 2013-12-30 NOTE — Progress Notes (Signed)
Vol admit to the 500 hall after presenting to the [facility] stating that he was feeling suicidal with a plan to jump in front of a moving car.  Pt was here about a month ago and was recently at the [facility] with flu symptoms along with depression.  Pt reports he lives in a shelter.  He says he has no family support.  He tells this Clinical research associate that he has no substance abuse issues, but his UDS was positive for cocaine and THC.  His BAL was <11.  He says he has been feeling hopeless/depressed with increased crying spells and poor sleep.  He denies HI/AV.  He says he is still having passive SI, but can contract for safety while here on the unit.  He says he would like to find somewhere else to live and is open to going to long term treatment.  Pt has several chronic health px including HIV, HTN, chronic systolic heart failure, genital herpes, and hyperlipidemia.  Pt was cooperative with the admission process.  Paperwork signed. Pt given a meal.  Pt re-oriented to unit/room.  Safety checks q15 minutes initiated.

## 2013-12-30 NOTE — Progress Notes (Signed)
D: Pt +ve passive SI-contracts. Pt denies HI/AVH. Pt is pleasant and cooperative. Pt complained of L-groin pain and was given 800 mg Motrin at 2224.   A: Pt was offered support and encouragement. Pt was given scheduled medications. Pt was encourage to attend groups. Q 15 minute checks were done for safety.   R:Pt attends groups and interacts well with peers and staff. Pt is taking medication. Pt has no complaints at this time.Pt receptive to treatment and safety maintained on unit.

## 2013-12-30 NOTE — [facility] Group Notes (Signed)
Adult Psychoeducational Group Note  Date:  12/30/2013 Time:  9:21 PM  Group Topic/Focus:  Wrap-Up Group:   The focus of this group is to help patients review their daily goal of treatment and discuss progress on daily workbooks.  Participation Level:  None  Participation Quality:  None  Affect:  None  Cognitive:  None  Insight: None  Engagement in Group:  None  Modes of Intervention:  Discussion  Additional Comments:  Rashard attended group but he did not participate.  [name] A 12/30/2013, 9:21 PM

## 2013-12-30 NOTE — Progress Notes (Signed)
Patient ID: Ryan Long, male   DOB: 09-27-1961, 53 y.o.   MRN: [id number] D- Patient reports he didn't sleep last night and his appetite is poor.  His energy level is low and his ability to pay attention is poor.  He is rating his depression and his hopelessness at 9/10.  He is having thoughts of self harm off and on and he does contract for safety .  He has been attending most groups for at least a short time.  He has showered today.  He has been polite and cooperative.

## 2013-12-30 NOTE — [facility] Group Notes (Signed)
[facility] LCSW Group Therapy      Feelings About Diagnosis 1:15 - 2:30 PM         12/30/2013  3:24 PM    Type of Therapy:  Group Therapy  Participation Level: Minimal  Participation Quality:  Appropriate  Affect:  Appropriate  Cognitive:  Alert and Appropriate  Insight:  Developing/Improving   Engagement in Therapy:  Developing/Improving   Modes of Intervention:  Discussion, Education, Exploration, Problem-Solving, Rapport Building, Support  Summary of Progress/Problems:  Patient shared he is not ready to talk in group.  He advised he has shared information in the past and it was used against him.  Writer thanked patient for his honesty and for remaining in group.    Wynn Banker 12/30/2013  3:24 PM

## 2013-12-30 NOTE — Progress Notes (Signed)
The focus of this group is to educate the patient on the purpose and policies of crisis stabilization and provide a format to answer questions about their admission.  The group details unit policies and expectations of patients while admitted.  Patient did not attend 0900 nurse education orientation group this morning.  Patient was sleeping in bed.

## 2013-12-30 NOTE — Progress Notes (Signed)
Recreation Therapy Notes  nimal-Assisted Activity/Therapy (AAA/T) Program Checklist/Progress Notes Patient Eligibility Criteria Checklist & Daily Group note for Rec Tx Intervention  Date: 01.13.2015 Time: 2:45pm Location: [address]   AAA/T Program Assumption of Risk Form signed by Patient/ or Parent Legal Guardian yes  Patient is free of allergies or sever asthma yes  Patient reports no fear of animals yes  Patient reports no history of cruelty to animals yes   Patient understands his/her participation is voluntary yes  Patient washes hands before animal contact yes  Patient washes hands after animal contact yes  Behavioral Response: Did not attend.   Marykay Lex Baxter Gonzalez, LRT/CTRS  Malynda Smolinski L 12/30/2013 5:25 PM
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
## 2013-12-31 NOTE — Progress Notes (Signed)
Patient ID: Ryan Long, male   DOB: December 25, 1960, 53 y.o.   MRN: [id number] D: pt. Lying in bed, reports depression at "5" of 10. Pt. Irritable, minimum interactions, poor eye contact. A: Writer introduce self to client and encourages him to verbalize concerns. Staff will monitor q82min for safety. Writer encouraged groups. R: Pt. Reluctant to speak to Clinical research associate.  Pt. Is safe on the unit. Pt. Did not attend group.

## 2013-12-31 NOTE — [facility] Group Notes (Signed)
Adult Psychoeducational Group Note  Date:  12/31/2013 Time:  10:48 PM  Group Topic/Focus:  Wrap-Up Group:   The focus of this group is to help patients review their daily goal of treatment and discuss progress on daily workbooks.  Participation Level:  Did Not Attend  Participation Quality:  None  Affect:  None  Cognitive:  None  Insight: None  Engagement in Group:  None  Modes of Intervention:  Discussion  Additional Comments:  Ryan Long did not attend group.  [name] A 12/31/2013, 10:48 PM

## 2013-12-31 NOTE — [facility] Group Notes (Signed)
[facility] LCSW Group Therapy  Emotional Regulation 1:15 - 2: 30 PM        12/31/2013  3:23 PM   Type of Therapy:  Group Therapy  Participation Level: Minimal  Participation Quality:  Appropriate  Affect:  Appropriate  Cognitive:  Attentive Appropriate  Insight:  Developing/Improving   Engagement in Therapy:  Developing/Improving   Modes of Intervention:  Discussion Exploration Problem-Solving Supportive  Summary of Progress/Problems:  Group topic was emotional regulations.  Patient left group early without participating. Wynn Banker 12/31/2013 3:23 PM

## 2013-12-31 NOTE — Progress Notes (Signed)
Patient ID: Ryan BabinskiBarry Long, male   DOB: 05-Apr-1961, 53 y.o.   MRN: [id number] [facility] Note  12/31/2013 1:32 PM Ryan BabinskiBarry Long  MRN:  [id number]  Subjective:  Patient has been suffering with substance induced mood disorder, polysubstance abuse and depressive disorder. He rates depression and anxiety as 5 /10  which is better than 10/10 on admission. Patient continued to endorse suicidal ideation and contracts for safety while in the hospital. Patient  stated that has better sleep and appetite with medications. Patient has been compliant with his medication and group therapies.   Diagnosis:   DSM5:  Substance/Addictive Disorders:  Alcohol Related Disorder - Moderate (303.90) Depressive Disorders:  Major Depressive Disorder - Severe (296.23)  Axis I: Major Depression, Recurrent severe, Substance Abuse and Substance Induced Mood Disorder Axis II: Deferred Axis III:  Past Medical History  Diagnosis Date  . HIV (human immunodeficiency virus infection)   . Chronic systolic heart failure   . Hypertension   . Genital herpes   . Active smoker   . AR (allergic rhinitis)   . HLD (hyperlipidemia)   . NICM (nonischemic cardiomyopathy)     Echocardiogram 06/28/11: EF 30-35%, mild MR, mild LAE;  [facility]  . NSVT (nonsustained ventricular tachycardia)   . Depression   . Bipolar 1 disorder   . Alcohol abuse   . Crack cocaine use   . COPD (chronic obstructive pulmonary disease)   . CHF (congestive heart failure)    Axis IV: other psychosocial or environmental problems, problems related to social environment and problems with primary support group Axis V: 41-50 serious symptoms  ADL's:  Intact  Sleep: Good  Appetite:  Good  Suicidal Ideation:  Plan:  none Intent:  yes Means:  none Homicidal Ideation:  Denies  Psychiatric Specialty Exam: Review of Systems  Constitutional: Negative.   HENT: Negative.   Eyes: Negative.    Respiratory: Negative.   Cardiovascular: Negative.   Gastrointestinal: Negative.   Genitourinary: Negative.   Musculoskeletal: Negative.   Skin: Negative.   Neurological: Negative.   Endo/Heme/Allergies: Negative.   Psychiatric/Behavioral: Positive for depression, suicidal ideas and substance abuse.    Blood pressure 113/71, pulse 66, temperature 97.9 F (36.6 C), temperature source Oral, resp. rate 16, height 5\' 6"  (1.676 m), weight 84.823 kg (187 lb), SpO2 98.00%.Body mass index is 30.2 kg/(m^2).  General Appearance: Casual  Eye Contact::  Minimal  Speech:  Normal Rate  Volume:  Normal  Mood:  Angry and Irritable  Affect:  Congruent  Thought Process:  Coherent  Orientation:  Full (Time, Place, and Person)  Thought Content:  WDL  Suicidal Thoughts:  Yes.  with intent/plan  Homicidal Thoughts:  No  Memory:  Immediate;   Fair Recent;   Fair Remote;   Fair  Judgement:  Fair  Insight:  Fair  Psychomotor Activity:  Decreased  Concentration:  Fair  Recall:  Fair  Akathisia:  No  Handed:  Right  AIMS (if indicated):     Assets:  Resilience  Sleep:  Number of Hours: 6.5   Current Medications: Current Facility-Administered Medications  Medication Dose Route Frequency Provider Last Rate Last Dose  . acetaminophen (TYLENOL) tablet 650 mg  650 mg Oral Q6H PRN Kerry HoughSpencer E Simon, PA-C   650 mg at 12/30/13 2006  . albuterol (PROVENTIL HFA;VENTOLIN HFA) 108 (90 BASE) MCG/ACT inhaler 2 puff  2 puff Inhalation PRN Kerry HoughSpencer E Simon, PA-C      .  alum & mag hydroxide-simeth (MAALOX/MYLANTA) 200-200-20 MG/5ML suspension 30 mL  30 mL Oral Q4H PRN Kerry Hough, PA-C      . aspirin chewable tablet 81 mg  81 mg Oral Daily Kerry Hough, PA-C   81 mg at 12/31/13 1610  . buPROPion (WELLBUTRIN XL) 24 hr tablet 300 mg  300 mg Oral Daily Kerry Hough, PA-C   300 mg at 12/31/13 9604  . digoxin (LANOXIN) tablet 125 mcg  125 mcg Oral Daily Kerry Hough, PA-C   125 mcg at 12/31/13 5409  .  emtricitabine-tenofovir (TRUVADA) 200-300 MG per tablet 1 tablet  1 tablet Oral Daily Kerry Hough, PA-C   1 tablet at 12/31/13 [phone]  . furosemide (LASIX) tablet 40 mg  40 mg Oral Daily Kerry Hough, PA-C   40 mg at 12/31/13 1478  . hydrALAZINE (APRESOLINE) tablet 25 mg  25 mg Oral BID Kerry Hough, PA-C   25 mg at 12/31/13 2956  . hydrOXYzine (ATARAX/VISTARIL) tablet 50 mg  50 mg Oral TID PRN Kerry Hough, PA-C   50 mg at 12/30/13 2224  . ibuprofen (ADVIL,MOTRIN) tablet 800 mg  800 mg Oral TID PRN Kerry Hough, PA-C   800 mg at 12/31/13 0856  . isosorbide dinitrate (ISORDIL) tablet 5 mg  5 mg Oral TID Kerry Hough, PA-C   5 mg at 12/31/13 1205  . lisinopril (PRINIVIL,ZESTRIL) tablet 5 mg  5 mg Oral Daily Kerry Hough, PA-C   5 mg at 12/31/13 0851  . magnesium hydroxide (MILK OF MAGNESIA) suspension 30 mL  30 mL Oral Daily PRN Kerry Hough, PA-C      . metoprolol succinate (TOPROL-XL) 24 hr tablet 150 mg  150 mg Oral Daily Kerry Hough, PA-C   150 mg at 12/31/13 2130  . nicotine (NICODERM CQ - dosed in mg/24 hours) patch 14 mg  14 mg Transdermal Q0600 Nehemiah Settle, MD   14 mg at 12/31/13 0636  . raltegravir (ISENTRESS) tablet 400 mg  400 mg Oral BID Kerry Hough, PA-C   400 mg at 12/31/13 8657  . spironolactone (ALDACTONE) tablet 12.5 mg  12.5 mg Oral Daily Kerry Hough, PA-C   12.5 mg at 12/31/13 8469  . traZODone (DESYREL) tablet 150 mg  150 mg Oral QHS Kerry Hough, PA-C   150 mg at 12/30/13 2224  . valACYclovir (VALTREX) tablet 500 mg  500 mg Oral BID Kerry Hough, PA-C   500 mg at 12/31/13 6295    Lab Results:  No results found for this or any previous visit (from the past 48 hour(s)).  Physical Findings: AIMS: Facial and Oral Movements Muscles of Facial Expression: None, normal Lips and Perioral Area: None, normal Jaw: None, normal Tongue: None, normal,Extremity Movements Upper (arms, wrists, hands, fingers): None, normal Lower  (legs, knees, ankles, toes): None, normal, Trunk Movements Neck, shoulders, hips: None, normal, Overall Severity Severity of abnormal movements (highest score from questions above): None, normal Incapacitation due to abnormal movements: None, normal Patient's awareness of abnormal movements (rate only patient's report): No Awareness, Dental Status Current problems with teeth and/or dentures?: No Does patient usually wear dentures?: No  CIWA:  CIWA-Ar Total: 0 COWS:     Treatment Plan Summary: Daily contact with patient to assess and evaluate symptoms and progress in treatment Medication management  Plan:  Review of chart, vital signs, medications, and notes. 1-Individual and group therapy 2-Medication management for depression and anxiety:  Medications reviewed with the patient Continue Wellbutrin XL 300 mg daily Continue Trazodonee 150 mg at bedtime  Continue hydroxyzine 50 mg at bedtime for better control of insomnia and anxiety.  3-Coping skills for depression, anxiety, and anger 4-Continue crisis stabilization and management 5-Address health issues--monitoring vital signs, refused 6-Treatment plan in progress to prevent relapse of depression and cocaine abuse 7. Disposition plans are in progress, may be discharged on Friday if he can show clinical improvement and contracts for safety.   Medical Decision Making Problem Points:  Established problem, stable/improving (1) and Review of psycho-social stressors (1) Data Points:  Review of new medications or change in dosage (2)  I certify that inpatient services furnished can reasonably be expected to improve the patient's condition.   Nehemiah Settle., M.D. 12/31/2013, 1:32 PM

## 2013-12-31 NOTE — [facility] Group Notes (Signed)
[facility] LCSW Aftercare Discharge Planning Group Note   12/31/2013 1:13 PM    Participation Quality:  Appropraite  Mood/Affect:  Appropriate  Depression Rating:  5  Anxiety Rating:  5  Thoughts of Suicide:  No  Will you contract for safety?   NA  Current AVH:  No  Plan for Discharge/Comments:  Patient attended discharge planning group and actively participated in group.  Patient advised of being better today.  He plans to continue follow up with [facility].  CSW provided all participants with daily workbook.   Transportation Means: Patient has transportation.   Supports:  Patient has a support system.   Samayra Hebel, Joesph July

## 2013-12-31 NOTE — Tx Team (Signed)
Interdisciplinary Treatment Plan Update   Date Reviewed:  12/31/2013  Time Reviewed:  9:43 AM  Progress in Treatment:   Attending groups: Yes Participating in groups: Yes Taking medication as prescribed: Yes  Tolerating medication: Yes Family/Significant other contact made:No.  Patient advised of no family involvement Patient understands diagnosis: Yes  Discussing patient identified problems/goals with staff: Yes Medical problems stabilized or resolved: Yes Denies suicidal/homicidal ideation: Yes Patient has not harmed self or others: Yes  For review of initial/current patient goals, please see plan of care.  Estimated Length of Stay:  2-3  Reasons for Continued Hospitalization:  Anxiety Depression Medication stabilization  New Problems/Goals identified:    Discharge Plan or Barriers:   Home with outpatient follow up with [facility]  Additional Comments:  Ryan Long is an 54 y.o. male, single, African-American admitted voluntarily and emergently from [facility] for symptoms of depression with suicidal ideation with plan to jump in front of a moving car. Patient was previously admitted to this unit about a month ago for similar conditions. Patient was discharged to shelter in [facility] and then receiving outpatient medication management and therapy through [facility]. He reports feeling increasingly depressed for the past couple of weeks and suicidal for the past 2-3 days. He has depressive symptoms including crying spells, poor sleep, poor appetite, social withdrawal, irritability and feelings of sadness and hopelessness. Patient has urinated drug screen is positive for cocaine and marijuana. He also has a history of drinking alcohol. He is not able to contract for safety outside a hospital at this time. He denies homicidal ideation or history of violence. He denies auditory hallucinations and says at times. He reports relapsing on crack, marijuana and  alcohol.  Attendees:  Patient:  12/31/2013 9:43 AM   Signature: Mervyn Gay, MD 12/31/2013 9:43 AM  Signature:   12/31/2013 9:43 AM  Signature:  Claudette Head, NP 12/31/2013 9:43 AM  Signature: 12/31/2013 9:43 AM  Signature:   12/31/2013 9:43 AM  Signature:  Juline Patch, LCSW 12/31/2013 9:43 AM  Signature:  Reyes Ivan, LCSW 12/31/2013 9:43 AM  Signature:  Leisa Lenz, Care Coordinator 12/31/2013 9:43 AM  Signature:  Aloha Gell, RN 12/31/2013 9:43 AM  Signature:  12/31/2013  9:43 AM  Signature:   Onnie Boer, RN [facility] 12/31/2013  9:43 AM  Signature:  Harold Barban, RN 12/31/2013  9:43 AM    Scribe for Treatment Team:   Juline Patch,  12/31/2013 9:43 AM

## 2013-12-31 NOTE — Progress Notes (Signed)
Patient ID: Ryan Long, male   DOB: Jun 07, 1961, 53 y.o.   MRN: [id number]  D: Pt. Denies HI and A/V Hallucinations. Pt. Does endorse passive SI but verbally contracts for safety. Patient reports pain in his left groin area and requested pain medication. Patient rates his depression and hopelessness at 5/10 today. Patient reports that he slept fair and his appetite is improving but he is still experiencing low energy level.  A: Support and encouragement provided to the patient. Scheduled medications given to patient per physician's orders. Patient received PRN ibuprofen for pain but stated that it did not work. However, patient refuses any other intervention. Writer encouraged patient to go to groups today.  R: Patient is receptive and cooperative but minimal and forwards little. Q15 minute checks are maintained for safety.

## 2013-12-31 NOTE — Progress Notes (Signed)
Recreation Therapy Notes  Date: 01.14.2015 Time: 3:00 Location: 500 Hall Dayroom   Group Topic: Communication, Team Building, Problem Solving  Goal Area(s) Addresses:  Patient will effectively work with peer towards shared goal.  Patient will identify skill used to make activity successful.  Patient will identify how skills used during activity can be used to reach post d/c goals.   Behavioral Response: Did not attend.   Marykay Lex Nhyira Leano, LRT/CTRS  Samule Life L 12/31/2013 5:03 PM

## 2014-01-01 MED ORDER — POTASSIUM CHLORIDE CRYS ER 20 MEQ PO TBCR
20.0000 meq | EXTENDED_RELEASE_TABLET | Freq: Once | ORAL | Status: AC
  Administered 2014-01-01: 20 meq via ORAL
  Filled 2014-01-01: qty 1
  Filled 2014-01-01: qty 2

## 2014-01-01 NOTE — Progress Notes (Signed)
Patient ID: Ryan Long, male   DOB: 1961/07/30, 53 y.o.   MRN: [id number] [facility] MD Progress Note  01/01/2014 6:14 PM Aviyon Rinella  MRN:  [id number]  Subjective:  Patient has been suffering with substance induced mood disorder, polysubstance abuse and depressive disorder. He rates depression and anxiety as 5 /10  which is better than 10/10 on admission. Patient continued to endorse suicidal ideation and contracts for safety while in the hospital. Patient  stated that has better sleep and appetite with medications. Patient has been compliant with his medication and group therapies.   During today's assessment, pt denies SI, HI, and AVH. Pt rates depression at 5/10 and anxiety at 5/10. Does contract for safety at this time. Reports that he is groggy and "slept all day". Nursing staff and pt both report that he skipped his medication this morning because he was in bed. Pt elaborates that this was because he felt some of his medications made him tired. Discussed w/pt and he agreed to take his medications, holding of course the duplicate doses of BP meds. Pt feels that the new Trazodone 50mg  may be too strong and made him sleep a lot (lowered to 25mg ). Pt denies other physical and psychological concerns at this time.   Diagnosis:   DSM5:  Substance/Addictive Disorders:  Alcohol Related Disorder - Moderate (303.90) Depressive Disorders:  Major Depressive Disorder - Severe (296.23)  Axis I: Major Depression, Recurrent severe, Substance Abuse and Substance Induced Mood Disorder Axis II: Deferred Axis III:  Past Medical History  Diagnosis Date  . HIV (human immunodeficiency virus infection)   . Chronic systolic heart failure   . Hypertension   . Genital herpes   . Active smoker   . AR (allergic rhinitis)   . HLD (hyperlipidemia)   . NICM (nonischemic cardiomyopathy)     Echocardiogram 06/28/11: EF 30-35%, mild MR, mild LAE;  No CAD by coronary CT angiogram 3/12 at [facility]  . NSVT  (nonsustained ventricular tachycardia)   . Depression   . Bipolar 1 disorder   . Alcohol abuse   . Crack cocaine use   . COPD (chronic obstructive pulmonary disease)   . CHF (congestive heart failure)    Axis IV: other psychosocial or environmental problems, problems related to social environment and problems with primary support group Axis V: 41-50 serious symptoms  ADL's:  Intact  Sleep: Good  Appetite:  Good  Suicidal Ideation:  Denies Homicidal Ideation:  Denies  Psychiatric Specialty Exam: Review of Systems  Constitutional: Negative.   HENT: Negative.   Eyes: Negative.   Respiratory: Negative.   Cardiovascular: Negative.   Gastrointestinal: Negative.   Genitourinary: Negative.   Musculoskeletal: Negative.   Skin: Negative.   Neurological: Negative.   Endo/Heme/Allergies: Negative.   Psychiatric/Behavioral: Positive for depression, suicidal ideas and substance abuse.    Blood pressure 113/74, pulse 71, temperature 97.7 F (36.5 C), temperature source Oral, resp. rate 16, height 5\' 6"  (1.676 m), weight 84.823 kg (187 lb), SpO2 98.00%.Body mass index is 30.2 kg/(m^2).  General Appearance: Casual  Eye Contact::  Minimal  Speech:  Normal Rate  Volume:  Normal  Mood:  Angry and Irritable  Affect:  Congruent  Thought Process:  Coherent  Orientation:  Full (Time, Place, and Person)  Thought Content:  WDL  Suicidal Thoughts:  No  Homicidal Thoughts:  No  Memory:  Immediate;   Fair Recent;   Fair Remote;   Fair  Judgement:  Fair  Insight:  Fair  Psychomotor Activity:  Decreased  Concentration:  Fair  Recall:  Fair  Akathisia:  No  Handed:  Right  AIMS (if indicated):     Assets:  Resilience  Sleep:  Number of Hours: 6.75   Current Medications: Current Facility-Administered Medications  Medication Dose Route Frequency Provider Last Rate Last Dose  . acetaminophen (TYLENOL) tablet 650 mg  650 mg Oral Q6H PRN Kerry Hough, PA-C   650 mg at 12/30/13 2006   . albuterol (PROVENTIL HFA;VENTOLIN HFA) 108 (90 BASE) MCG/ACT inhaler 2 puff  2 puff Inhalation PRN Kerry Hough, PA-C      . alum & mag hydroxide-simeth (MAALOX/MYLANTA) 200-200-20 MG/5ML suspension 30 mL  30 mL Oral Q4H PRN Kerry Hough, PA-C      . aspirin chewable tablet 81 mg  81 mg Oral Daily Kerry Hough, PA-C   81 mg at 01/01/14 1529  . buPROPion (WELLBUTRIN XL) 24 hr tablet 300 mg  300 mg Oral Daily Kerry Hough, PA-C   300 mg at 01/01/14 1530  . digoxin (LANOXIN) tablet 125 mcg  125 mcg Oral Daily Kerry Hough, PA-C   125 mcg at 01/01/14 1530  . emtricitabine-tenofovir (TRUVADA) 200-300 MG per tablet 1 tablet  1 tablet Oral Daily Kerry Hough, PA-C   1 tablet at 01/01/14 1531  . furosemide (LASIX) tablet 40 mg  40 mg Oral Daily Kerry Hough, PA-C   40 mg at 01/01/14 1532  . hydrALAZINE (APRESOLINE) tablet 25 mg  25 mg Oral BID Kerry Hough, PA-C   25 mg at 01/01/14 1533  . hydrOXYzine (ATARAX/VISTARIL) tablet 50 mg  50 mg Oral TID PRN Kerry Hough, PA-C   50 mg at 12/31/13 2145  . ibuprofen (ADVIL,MOTRIN) tablet 800 mg  800 mg Oral TID PRN Kerry Hough, PA-C   800 mg at 12/31/13 2145  . isosorbide dinitrate (ISORDIL) tablet 5 mg  5 mg Oral TID Kerry Hough, PA-C   5 mg at 01/01/14 1719  . lisinopril (PRINIVIL,ZESTRIL) tablet 5 mg  5 mg Oral Daily Kerry Hough, PA-C   5 mg at 01/01/14 1538  . magnesium hydroxide (MILK OF MAGNESIA) suspension 30 mL  30 mL Oral Daily PRN Kerry Hough, PA-C      . metoprolol succinate (TOPROL-XL) 24 hr tablet 150 mg  150 mg Oral Daily Kerry Hough, PA-C   150 mg at 01/01/14 1538  . nicotine (NICODERM CQ - dosed in mg/24 hours) patch 14 mg  14 mg Transdermal Q0600 Nehemiah Settle, MD   14 mg at 01/01/14 1610  . raltegravir (ISENTRESS) tablet 400 mg  400 mg Oral BID Kerry Hough, PA-C   400 mg at 01/01/14 1540  . spironolactone (ALDACTONE) tablet 12.5 mg  12.5 mg Oral Daily Kerry Hough, PA-C   12.5 mg  at 01/01/14 1542  . traZODone (DESYREL) tablet 150 mg  150 mg Oral QHS Kerry Hough, PA-C   150 mg at 12/31/13 2143  . valACYclovir (VALTREX) tablet 500 mg  500 mg Oral BID Kerry Hough, PA-C   500 mg at 01/01/14 1542    Lab Results:  No results found for this or any previous visit (from the past 48 hour(s)).  Physical Findings: AIMS: Facial and Oral Movements Muscles of Facial Expression: None, normal Lips and Perioral Area: None, normal Jaw: None, normal Tongue: None, normal,Extremity Movements Upper (arms, wrists, hands, fingers): None, normal Lower (legs, knees,  ankles, toes): None, normal, Trunk Movements Neck, shoulders, hips: None, normal, Overall Severity Severity of abnormal movements (highest score from questions above): None, normal Incapacitation due to abnormal movements: None, normal Patient's awareness of abnormal movements (rate only patient's report): No Awareness, Dental Status Current problems with teeth and/or dentures?: No Does patient usually wear dentures?: No  CIWA:  CIWA-Ar Total: 2 COWS:  COWS Total Score: 2  Treatment Plan Summary: Daily contact with patient to assess and evaluate symptoms and progress in treatment Medication management  Plan:  Review of chart, vital signs, medications, and notes. 1-Individual and group therapy 2-Medication management for depression and anxiety:   Medications reviewed with the patient Continue Wellbutrin XL 300 mg daily Continue Trazodone 150 mg at bedtime  Continue hydroxyzine; lower to 25mg  (too groggy) at bedtime for better control of insomnia and anxiety.  3-Coping skills for depression, anxiety, and anger 4-Continue crisis stabilization and management 5-Address health issues--monitoring vital signs, refused 6-Treatment plan in progress to prevent relapse of depression and cocaine abuse 7. Disposition plans are in progress, may be discharged on Friday if he can show clinical improvement and contracts for  safety.   Medical Decision Making Problem Points:  Established problem, stable/improving (1) and Review of psycho-social stressors (1) Data Points:  Review of medication regiment & side effects (2) Review of new medications or change in dosage (2)  I certify that inpatient services furnished can reasonably be expected to improve the patient's condition.   Beau FannyWithrow, [name] C, FNP-BC 01/01/2014, 6:14 PM  Reviewed the information documented and agree with the treatment plan.  Jestin Burbach,[name] 01/02/2014 6:45 PM

## 2014-01-01 NOTE — Progress Notes (Signed)
Recreation Therapy Notes  Animal-Assisted Activity/Therapy (AAA/T) Program Checklist/Progress Notes Patient Eligibility Criteria Checklist & Daily Group note for Rec Tx Intervention  Date: 01.15.2015 Time: 2:45pm Location: 500 hall dayroom    AAA/T Program Assumption of Risk Form signed by Patient/ or Parent Legal Guardian yes  Patient is free of allergies or sever asthma yes  Patient reports no fear of animals yes  Patient reports no history of cruelty to animals yes   Patient understands his/her participation is voluntary yes  Behavioral Response: Did not attend.    Allister Lessley L [place], LRT/CTRS  Lorne Winkels L 01/01/2014 4:37 PM 

## 2014-01-01 NOTE — Progress Notes (Signed)
D:  Patient's self inventory sheet filled out by nurse, poor sleep, poor appetite, low energy level, poor attention span.  Rated depression, hopeless and anxiety #6.  Denied withdrawals.  SI off/on, contracts for safety.  Pain in groin, worst pain #6.  Plans to discharge to shelter.  Needs financial assistance to purchase medications after discharge.   A:  Medications refused by patient on several occasions.  Emotional support and encouragement given patient. R:  SI off/on, contracts for safety.  Denied HI.  Denied A/V hallucinations.  Patient will not get out of bed, will not take medications, no groups.  Patient wants to isolate. MD informed.

## 2014-01-01 NOTE — Progress Notes (Signed)
Adult Psychoeducational Group Note  Date:  01/01/2014 Time:  5:05 PM  Group Topic/Focus:  Rediscovering Joy:   The focus of this group is to explore various ways to relieve stress in a positive manner and how to rediscover joy and laughter in their life.  Participation Level:  Did not attend. Pt was in bed asleep.  Caswell Corwin 01/01/2014, 5:05 PM

## 2014-01-01 NOTE — Progress Notes (Signed)
Patient ID: Ryan Long, male   DOB: 1961-11-07, 52 y.o.   MRN: [id number] D: pt. In bed, reports "too much stuff going on out there, so I just stay in my room" "I done had to get in it with one off them" "them people crazy out there, I don't have time for all that" Pt. Reports depression at "4"of 10. A: Writer encouraged pt. To use coping skills avoid confrontations and remain calm. Staff will monitor q82min for safety. R: Pt. Is safe on the unit.

## 2014-01-01 NOTE — Progress Notes (Signed)
Pt didn't attend group. 

## 2014-01-01 NOTE — [facility] Group Notes (Signed)
[facility] LCSW Group Therapy  01/01/2014 1:15 PM   Type of Therapy:  Group Therapy  Participation Level:  Did Not Attend - pt was sleeping in their room  Reyes Ivan, LCSW 01/01/2014 2:36 PM

## 2014-01-01 NOTE — Progress Notes (Signed)
The focus of this group is to educate the patient on the purpose and policies of crisis stabilization and provide a format to answer questions about their admission.  The group details unit policies and expectations of patients while admitted.  Patient refused to attend 0900 nurse education group this morning.  Refused to get out of bed.

## 2014-01-02 MED ORDER — SPIRONOLACTONE 25 MG PO TABS: 12.5000 mg | ORAL_TABLET | Freq: Every day | ORAL | Status: DC

## 2014-01-02 MED ORDER — METOPROLOL SUCCINATE ER 50 MG PO TB24: 150.0000 mg | ORAL_TABLET | Freq: Every day | ORAL | Status: DC

## 2014-01-02 MED ORDER — ISOSORBIDE DINITRATE 5 MG PO TABS: 5.0000 mg | ORAL_TABLET | Freq: Three times a day (TID) | ORAL | Status: DC

## 2014-01-02 MED ORDER — POTASSIUM CHLORIDE CRYS ER 10 MEQ PO TBCR
10.0000 meq | EXTENDED_RELEASE_TABLET | Freq: Every day | ORAL | Status: DC
  Administered 2014-01-02: 10 meq via ORAL
  Filled 2014-01-02: qty 1
  Filled 2014-01-02: qty 3
  Filled 2014-01-02: qty 1

## 2014-01-02 MED ORDER — POTASSIUM CHLORIDE CRYS ER 10 MEQ PO TBCR: 10.0000 meq | EXTENDED_RELEASE_TABLET | Freq: Every day | ORAL | Status: DC
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
## 2014-01-02 NOTE — Discharge Instructions (Signed)
Depression, Adult °Depression is feeling sad, low, down in the dumps, blue, gloomy, or empty. In general, there are two kinds of depression: °· Normal sadness or grief. This can happen after something upsetting. It often goes away on its own within 2 weeks. After losing a loved one (bereavement), normal sadness and grief may last longer than two weeks. It usually gets better with time. °· Clinical depression. This kind lasts longer than normal sadness or grief. It keeps you from doing the things you normally do in life. It is often hard to function at home, work, or at school. It may affect your relationships with others. Treatment is often needed. °GET HELP RIGHT AWAY IF: °· You have thoughts about hurting yourself or others. °· You lose touch with reality (psychotic symptoms). You may: °· See or hear things that are not real. °· Have untrue beliefs about your life or people around you. °· Your medicine is giving you problems. °MAKE SURE YOU: °· Understand these instructions. °· Will watch your condition. °· Will get help right away if you are not doing well or get worse. °Document Released: 01/06/2011 Document Revised: 08/28/2012 Document Reviewed: 04/04/2012 °Exit[employer]® Patient Information ©2014 [employer]. ° °

## 2014-01-02 NOTE — Tx Team (Signed)
Interdisciplinary Treatment Plan Update   Date Reviewed:  01/02/2014  Time Reviewed:  8:27 AM  Progress in Treatment:   Attending groups: Yes Participating in groups: Yes Taking medication as prescribed: Yes  Tolerating medication: Yes Family/Significant other contact made:No.  Patient advised of no family involvement Patient understands diagnosis: Yes  Discussing patient identified problems/goals with staff: Yes Medical problems stabilized or resolved: Yes Denies suicidal/homicidal ideation: Yes Patient has not harmed self or others: Yes  For review of initial/current patient goals, please see plan of care.  Estimated Length of Stay:  Discharge today  Reasons for Continued Hospitalization:   New Problems/Goals identified:    Discharge Plan or Barriers:   Home with outpatient follow up with [facility] [place]  Additional Comments:    Attendees:  Patient:   Ryan Long 01/02/2014 8:27 AM   Signature: Mervyn Gay, MD 01/02/2014 8:27 AM  Signature:   01/02/2014 8:27 AM  Signature:  Quintella Reichert, RN  01/02/2014 8:27 AM  Signature: 01/02/2014 8:27 AM  Signature:   01/02/2014 8:27 AM  Signature:  Juline Patch, LCSW 01/02/2014 8:27 AM  Signature:  Reyes Ivan, LCSW 01/02/2014 8:27 AM  Signature:  Leisa Lenz, Care Coordinator 01/02/2014 8:27 AM  Signature:  01/02/2014 8:27 AM  Signature:  01/02/2014  8:27 AM  Signature:   Onnie Boer, RN [facility] 01/02/2014  8:27 AM  Signature:   01/02/2014  8:27 AM    Scribe for Treatment Team:   Juline Patch,  01/02/2014 8:27 AM

## 2014-01-02 NOTE — Progress Notes (Signed)
Recreation Therapy Notes  Date: 01.16.2015 Time: 2:45pm Location: [address] Dayroom   Group Topic: Leisure Education  Goal Area(s) Addresses:  Patient will identify positive leisure activities.  Patient will identify one positive benefit of participation in leisure activities.   Behavioral Response:  Appropriate   Intervention: Game  Activity: Leisure ABC's. As a group patients were asked to identify leisure activities to correspond with each letter of the alphabet.   Education:  Leisure Education, Pharmacologist, Discharge Planning  Education Outcome: Acknowledges understanding  Clinical Observations/Feedback: Patient actively engaged in group activity, identifying activities to correspond with letters of the alphabet. Patient made no contributions to group discussion, but appeared to actively listen as he maintained appropriate eye contact with speaker.     Marykay Lex Aldyn Toon, LRT/CTRS  Gerritt Galentine L 01/02/2014 4:09 PM

## 2014-01-02 NOTE — [facility] Suicide Risk Assessment (Signed)
Suicide Risk Assessment  Discharge Assessment     Demographic Factors:  Male, Adolescent or young adult, Low socioeconomic status, Living alone and Unemployed  Mental Status Per Nursing Assessment::   On Admission:  Suicidal ideation indicated by patient;Self-harm thoughts  Current Mental Status by Physician: Mental Status Examination: Patient appeared as per his stated age, casually dressed, and fairly groomed, and maintaining good eye contact. Patient has good mood and his affect was constricted. He has normal rate, rhythm, and volume of speech. His thought process is linear and goal directed. Patient has denied suicidal, homicidal ideations, intentions or plans. Patient has no evidence of auditory or visual hallucinations, delusions, and paranoia. Patient has fair insight judgment and impulse control.  Loss Factors: Financial problems/change in socioeconomic status  Historical Factors: Prior suicide attempts, Family history of mental illness or substance abuse and Impulsivity  Risk Reduction Factors:   Religious beliefs about death, Positive social support, Positive therapeutic relationship and Positive coping skills or problem solving skills  Continued Clinical Symptoms:  Depression:   Recent sense of peace/wellbeing Alcohol/Substance Abuse/Dependencies Previous Psychiatric Diagnoses and Treatments Medical Diagnoses and Treatments/Surgeries  Cognitive Features That Contribute To Risk:  Polarized thinking    Suicide Risk:  Minimal: No identifiable suicidal ideation.  Patients presenting with no risk factors but with morbid ruminations; may be classified as minimal risk based on the severity of the depressive symptoms  Discharge Diagnoses:   AXIS I:  Major Depression, Recurrent severe, Substance Induced Mood Disorder and Polysubstance dependence AXIS II:  Deferred AXIS III:   Past Medical History  Diagnosis Date  . HIV (human immunodeficiency virus infection)   . Chronic  systolic heart failure   . Hypertension   . Genital herpes   . Active smoker   . AR (allergic rhinitis)   . HLD (hyperlipidemia)   . NICM (nonischemic cardiomyopathy)     Echocardiogram 06/28/11: EF 30-35%, mild MR, mild LAE;  No CAD by coronary CT angiogram 3/12 at [facility]  . NSVT (nonsustained ventricular tachycardia)   . Depression   . Bipolar 1 disorder   . Alcohol abuse   . Crack cocaine use   . COPD (chronic obstructive pulmonary disease)   . CHF (congestive heart failure)    AXIS IV:  economic problems, housing problems, occupational problems, other psychosocial or environmental problems, problems related to social environment and problems with primary support group AXIS V:  51-60 moderate symptoms  Plan Of Care/Follow-up recommendations:  Activity:  As tolerated Diet:  Regular  Is patient on multiple antipsychotic therapies at discharge:  No   Has Patient had three or more failed trials of antipsychotic monotherapy by history:  No  Recommended Plan for Multiple Antipsychotic Therapies: NA  Ethelean Colla,[name] 01/02/2014, 12:05 PM

## 2014-01-02 NOTE — Progress Notes (Signed)
Nrsg DC MD completed DC order in pt's chart as well as SRA, Pt completed self eval this AM and on it he wrote he denied SI within the past 24 hrs, he rated his depression and hopelessness "2/2" and he stated his DC plan is to " leave alcohol alone today". His DC AVS is reviewed with him and he is given his prescriptions for meds as well as his sample meds from the pharmacist. He says he is planning on saying in the shelter until he can get his outpatient care established.Safety is maintained.

## 2014-01-02 NOTE — Progress Notes (Signed)
[facility] Adult Case Management Discharge Plan :  Will you be returning to the same living situation after discharge: Yes,  Patient returning to previous living arrangement At discharge, do you have transportation home?:Yes,  Patient assisted with bus pass Do you have the ability to pay for your medications:No.Patient needs assistance with medication.  Release of information consent forms completed and in the chart;  Patient's signature needed at discharge.  Patient to Follow up at: Follow-up Information   Follow up with [facility] On 01/06/2014. (Please go to [facility]'s walk in clinic Tuesday, January 06, 2014 between 8 AM-5 PM)    Contact information:   [address] [place], [place]  [address]  [phone]      Patient denies SI/HI:   Patient no longer endorsing SI/HI or other thoughts of self harm.   Safety Planning and Suicide Prevention discussed: .Reviewed with all patients during discharge planning group   Ersel Enslin, Joesph July 01/02/2014, 10:25 AM

## 2014-01-02 NOTE — Progress Notes (Signed)
Adult Psychoeducational Group Note  Date:  01/02/2014 Time:  10:00am Group Topic/Focus:  Relapse Prevention Planning:   The focus of this group is to define relapse and discuss the need for planning to combat relapse.  Participation Level:  Did Not Attend  Participation Quality:    Affect:   Cognitive:    Insight:   Engagement in Group:   Modes of Intervention:   Additional Comments:  Pt did not attend group  Shelly Bombard D 01/02/2014, 3:20 PM

## 2014-01-02 NOTE — Progress Notes (Signed)
Adult Psychoeducational Group Note  Date:  01/02/2014 Time:  10:00am Group Topic/Focus:  Relapse Prevention Planning:   The focus of this group is to define relapse and discuss the need for planning to combat relapse.  Participation Level:  Did Not Attend  Participation Quality:    Affect:    Cognitive:    Insight:   Engagement in Group:    Modes of Intervention:    Additional Comments:  Pt did not attend group.  Shelly Bombard D 01/02/2014, 3:19 PM

## 2014-01-02 NOTE — [facility] Group Notes (Signed)
[facility] LCSW Aftercare Discharge Planning Group Note   01/02/2014 9:39 AM    Participation Quality:  Appropraite  Mood/Affect:  Appropriate  Depression Rating:  0  Anxiety Rating:  0  Thoughts of Suicide:  No  Will you contract for safety?   NA  Current AVH:  No  Plan for Discharge/Comments:  Patient attended discharge planning group and actively participated in group.  He reports doing well and hopes to discharge today.  He will follow up with [facility] in [facility].  CSW provided all participants with daily workbook.   Transportation Means: Patient has transportation.   Supports:  Patient has a support system.   Gayl Ivanoff, Joesph July

## 2014-01-07 NOTE — Progress Notes (Signed)
Patient Discharge Instructions:  After Visit Summary (AVS):   Faxed to:  01/07/14 Discharge Summary Note:   Faxed to:  01/07/14 Psychiatric Admission Assessment Note:   Faxed to:  01/07/14 Suicide Risk Assessment - Discharge Assessment:   Faxed to:  01/07/14 Faxed/Sent to the Next Level Care provider:  01/07/14 Faxed to [facility] @ [phone]  Jerelene Redden, 01/07/2014, 1:31 PM

## 2014-01-12 NOTE — Discharge Summary (Signed)
Physician Discharge Summary Note  Patient:  Ryan Long is an 53 y.o., male MRN:  [id number] DOB:  25-May-1961 Patient phone:  [phone] (home)  Patient address:   [address] [place] [address]   Date of Admission:  11/22/2013 Date of Discharge: 11/28/2013  Reason for Admission:  Depression with suicide plan  Discharge Diagnoses: Active Problems:   Major depressive disorder, single episode   Cocaine abuse, episodic use   Alcohol dependence   Substance induced mood disorder   Major depressive disorder  Axis Diagnosis:  Axis I:  MDD, single episode; Cocaine abuse; Alcohol dependency; Substance induced mood disorder Axis II:  None Axis III:  Cardiomyopathy, HIV, HTN Axis IV:  Psychosocial stressors Axis V:  70; mild  Level of Care:  OP  Hospital Course:   On admission: 53 year old male with a history of a dilated cardiomyopathy likely secondary to his HIV history according to cardiology notes. He has a known history of congestive heart failure with an ejection fraction of 35% but nonobstructive coronary disease according to an angiogram performed 2 years ago. He presents with recurrent chest pain after using 4 straight days of smoking crack cocaine. He states that he was recently put on onto the street in his own words, he was evicted from his home in [place], is feeling more depressed, suicidal and states that he has a plans-- walking from a car on the street. He has been seen in the emergency department in the past for both chest pain and for his psychiatric complaints. At this time his heart and lungs are clear other than mild expiratory wheezing, there is no signs of acute ischemia on his EKG infected is improved compared to prior EKGs where there was T wave inversions. While the patient is here in the emergency department he will need several sets of cardiac enzymes, I have discussed his care with the behavioral health assessment team who will see him in regards to his  psychiatric complaints as well. Irritable and would not go over his medications with this extender.   During hospitalization:  Medications managed--Medical medications continued from his home medications.  His Wellbutrin 150 mg daily for depression increased to 300 mg and his Trazodone 50 mg at bedtime for sleep issues increased to 150 mg.  His Vistaril 25 mg TID for anxiety was continued.  Pierre attended and participated in therapy.  He denied suicidal/homicidal ideations and auditory/visual hallucinations, follow-up appointments encouraged to attend, outside support groups encouraged and information given, Rx given.  Ryan Long is mentally and physically stable for discharge.  While a patient in this hospital, Ryan Long was enrolled in group counseling and activities as well as received the following medication No current facility-administered medications for this encounter. Current outpatient prescriptions:albuterol (PROVENTIL HFA;VENTOLIN HFA) 108 (90 BASE) MCG/ACT inhaler, Inhale 2 puffs into the lungs as needed for shortness of breath. Per pt med is as needed with no frequency for asthma., Disp: 1 Inhaler, Rfl: 0;  aspirin 81 MG tablet, Take 1 tablet (81 mg total) by mouth daily. For decreased platelet aggregation., Disp: 30 tablet, Rfl: 0 buPROPion (WELLBUTRIN XL) 150 MG 24 hr tablet, Take 2 tablets (300 mg total) by mouth daily. For depression., Disp: 30 tablet, Rfl: 0;  digoxin (LANOXIN) 0.125 MG tablet, Take 1 tablet (125 mcg total) by mouth daily. For control of heart arrythmia., Disp: , Rfl: ;  emtricitabine-tenofovir (TRUVADA) 200-300 MG per tablet, Take 1 tablet by mouth daily. For HIV, Disp: 30 tablet,  Rfl: 6 furosemide (LASIX) 40 MG tablet, Take 1 tablet (40 mg total) by mouth daily. For edema, Disp: 30 tablet, Rfl: 0;  hydrALAZINE (APRESOLINE) 25 MG tablet, Take 1 tablet (25 mg total) by mouth 2 (two) times daily., Disp: , Rfl: ;  hydrOXYzine (ATARAX/VISTARIL) 25 MG tablet, Take 2 tablets (50  mg total) by mouth 3 (three) times daily as needed for anxiety., Disp: 30 tablet, Rfl: 0 isosorbide dinitrate (ISORDIL) 5 MG tablet, Take 1 tablet (5 mg total) by mouth 3 (three) times daily., Disp: 90 tablet, Rfl: 0;  lisinopril (PRINIVIL,ZESTRIL) 5 MG tablet, Take 1 tablet (5 mg total) by mouth daily. For hypertension, Disp: , Rfl: ;  metoprolol succinate (TOPROL-XL) 50 MG 24 hr tablet, Take 3 tablets (150 mg total) by mouth daily. Take with or immediately following a meal., Disp: , Rfl:  potassium chloride (K-DUR,KLOR-CON) 10 MEQ tablet, Take 1 tablet (10 mEq total) by mouth daily., Disp: 30 tablet, Rfl: 0;  raltegravir (ISENTRESS) 400 MG tablet, Take 1 tablet (400 mg total) by mouth 2 (two) times daily. For HIV, Disp: 60 tablet, Rfl: 6;  spironolactone (ALDACTONE) 25 MG tablet, Take 0.5 tablets (12.5 mg total) by mouth daily., Disp: 30 tablet, Rfl: 0 traZODone (DESYREL) 150 MG tablet, Take 1 tablet (150 mg total) by mouth at bedtime. For sleep., Disp: , Rfl: ;  valACYclovir (VALTREX) 500 MG tablet, Take 1 tablet (500 mg total) by mouth 2 (two) times daily. For Herpes., Disp: , Rfl:   Patient attended treatment team meeting this am and met with treatment team members. Pt symptoms, treatment plan and response to treatment discussed. Ryan Long endorsed that their symptoms have improved. Pt also stated that they are stable for discharge.  In other to control Active Problems:   Major depressive disorder, single episode   Cocaine abuse, episodic use   Alcohol dependence   Substance induced mood disorder   Major depressive disorder , they will continue psychiatric care on outpatient basis. They will follow-up at  Follow-up Information   Follow up with [facility] On 12/01/2013. (Walk in on this date for hospital discharge appointment, for medication management and therapy. Walk in clinic is Monday - Friday 8 am - 3 pm.)    Contact information:   [address]  [place], [place] [address] Phone:  [phone] Fax: [phone]    .  In addition they were instructed to take all your medications as prescribed by your mental healthcare provider, to report any adverse effects and or reactions from your medicines to your outpatient provider promptly, patient is instructed and cautioned to not engage in alcohol and or illegal drug use while on prescription medicines, in the event of worsening symptoms, patient is instructed to call the crisis hotline, 911 and or go to the nearest ED for appropriate evaluation and treatment of symptoms.   Upon discharge, patient adamantly denies suicidal, homicidal ideations, auditory, visual hallucinations and or delusional thinking. They left [facility] with all personal belongings in no apparent distress.  Consults:  See electronic record for details  Significant Diagnostic Studies:  See electronic record for details  Discharge Vitals:   Blood pressure 105/71, pulse 99, temperature 97.7 F (36.5 C), temperature source Oral, resp. rate 18, height 5' 9"  (1.753 m), weight 86.637 kg (191 lb), SpO2 99.00%..  Mental Status Exam: See Mental Status Examination and Suicide Risk Assessment completed by Attending Physician prior to discharge.  Discharge destination:  Home  Is patient on multiple antipsychotic therapies at discharge:  No  Has Patient had three or more failed trials of antipsychotic monotherapy by history: N/A Recommended Plan for Multiple Antipsychotic Therapies: N/A Discharge Orders   Future Appointments Provider Department Dept Phone   01/19/2014 10:00 AM [facility] [phone]   02/19/2014 9:30 AM [facility] [phone]   03/09/2014 9:30 AM Truman Hayward, MD [facility] for Infectious Disease [phone]   Future Orders Complete By Expires   Diet - low sodium heart healthy  As directed    Discharge instructions  As  directed    Comments:     Take all of your medications as directed. Be sure to keep all of your follow up appointments.  If you are unable to keep your follow up appointment, call your Doctor's office to let them know, and reschedule.  Make sure that you have enough medication to last until your appointment. Be sure to get plenty of rest. Going to bed at the same time each night will help. Try to avoid sleeping during the day.  Increase your activity as tolerated. Regular exercise will help you to sleep better and improve your mental health. Eating a heart healthy diet is recommended. Try to avoid salty or fried foods. Be sure to avoid all alcohol and illegal drugs.   Increase activity slowly  As directed        Medication List    STOP taking these medications       acetaminophen 325 MG tablet  Commonly known as:  TYLENOL     doxycycline 100 MG capsule  Commonly known as:  VIBRAMYCIN     hydrALAZINE 10 MG tablet  Commonly known as:  APRESOLINE     hydrochlorothiazide 25 MG tablet  Commonly known as:  HYDRODIURIL     isosorbide dinitrate 10 MG tablet  Commonly known as:  ISORDIL     metoprolol succinate 50 MG 24 hr tablet  Commonly known as:  TOPROL-XL     multivitamin tablet     spironolactone 25 MG tablet  Commonly known as:  ALDACTONE      TAKE these medications     Indication   albuterol 108 (90 BASE) MCG/ACT inhaler  Commonly known as:  PROVENTIL HFA;VENTOLIN HFA  Inhale 2 puffs into the lungs as needed for shortness of breath. Per pt med is as needed with no frequency for asthma.      aspirin 81 MG tablet  Take 1 tablet (81 mg total) by mouth daily. For decreased platelet aggregation.      buPROPion 150 MG 24 hr tablet  Commonly known as:  WELLBUTRIN XL  Take 2 tablets (300 mg total) by mouth daily. For depression.   Indication:  Depressive Phase of Manic-Depression     digoxin 0.125 MG tablet  Commonly known as:  LANOXIN  Take 1 tablet (125 mcg total) by  mouth daily. For control of heart arrythmia.      furosemide 40 MG tablet  Commonly known as:  LASIX  Take 1 tablet (40 mg total) by mouth daily. For edema      hydrOXYzine 25 MG tablet  Commonly known as:  ATARAX/VISTARIL  Take 2 tablets (50 mg total) by mouth 3 (three) times daily as needed for anxiety.   Indication:  Anxiety Neurosis     lisinopril 5 MG tablet  Commonly known as:  PRINIVIL,ZESTRIL  Take 1 tablet (5 mg total) by  mouth daily. For hypertension      traZODone 150 MG tablet  Commonly known as:  DESYREL  Take 1 tablet (150 mg total) by mouth at bedtime. For sleep.      valACYclovir 500 MG tablet  Commonly known as:  VALTREX  Take 1 tablet (500 mg total) by mouth 2 (two) times daily. For Herpes.            Follow-up Information   Follow up with [facility] On 12/01/2013. (Walk in on this date for hospital discharge appointment, for medication management and therapy. Walk in clinic is Monday - Friday 8 am - 3 pm.)    Contact information:   [address] [place], [place] [address] Phone: [phone] Fax: [phone]     Follow-up recommendations:   Activities: Resume typical activities Diet: Resume typical diet Tests: none Other: Follow up with outpatient provider and report any side effects to out patient prescriber.  Comments:   Take all your medications as prescribed by your mental healthcare provider. Report any adverse effects and or reactions from your medicines to your outpatient provider promptly. Patient is instructed and cautioned to not engage in alcohol and or illegal drug use while on prescription medicines. In the event of worsening symptoms, patient is instructed to call the crisis hotline, 911 and or go to the nearest ED for appropriate evaluation and treatment of symptoms. Follow-up with your primary care provider for your other medical issues, concerns and or health care needs.  Total Discharge Time: Greater than 30 minutes  Signed: Waylan Boga, PMH-NP 01/12/2014 2:18 PM Completed for Nena Polio, [facility]  Patient was seen for psychiatric evaluation, suicide risk assessment, formulated treatment plan and also made the discharge plan. Reviewed the information documented by physician extender and agree with the treatment plan.  Christiann Hagerty,[name] 01/12/2014 3:03 PM

## 2014-01-14 ENCOUNTER — Other Ambulatory Visit: Payer: Self-pay | Admitting: *Deleted

## 2014-01-14 DIAGNOSIS — B20 Human immunodeficiency virus [HIV] disease: Secondary | ICD-10-CM

## 2014-01-14 MED ORDER — EMTRICITABINE-TENOFOVIR DF 200-300 MG PO TABS: 1.0000 | ORAL_TABLET | Freq: Every day | ORAL | Status: DC

## 2014-01-14 MED ORDER — RALTEGRAVIR POTASSIUM 400 MG PO TABS: 400.0000 mg | ORAL_TABLET | Freq: Two times a day (BID) | ORAL | Status: DC

## 2014-01-19 ENCOUNTER — Encounter (HOSPITAL_COMMUNITY): Payer: Self-pay

## 2014-02-16 DIAGNOSIS — F1210 Cannabis abuse, uncomplicated: Secondary | ICD-10-CM | POA: Insufficient documentation

## 2014-02-19 ENCOUNTER — Other Ambulatory Visit: Payer: Self-pay

## 2014-03-09 ENCOUNTER — Ambulatory Visit: Payer: Self-pay | Admitting: Infectious Disease

## 2014-05-29 DIAGNOSIS — F1590 Other stimulant use, unspecified, uncomplicated: Secondary | ICD-10-CM | POA: Insufficient documentation

## 2014-05-29 DIAGNOSIS — F411 Generalized anxiety disorder: Secondary | ICD-10-CM | POA: Insufficient documentation

## 2014-07-01 ENCOUNTER — Encounter (HOSPITAL_COMMUNITY): Payer: Self-pay | Admitting: Emergency Medicine

## 2014-07-01 ENCOUNTER — Emergency Department (HOSPITAL_COMMUNITY)
Admission: EM | Admit: 2014-07-01 | Discharge: 2014-07-02 | Disposition: A | Discharge status: Other Acute Inpatient Hospital | Payer: 59 | Attending: Emergency Medicine | Admitting: Emergency Medicine

## 2014-07-01 DIAGNOSIS — F411 Generalized anxiety disorder: Secondary | ICD-10-CM | POA: Insufficient documentation

## 2014-07-01 DIAGNOSIS — F3130 Bipolar disorder, current episode depressed, mild or moderate severity, unspecified: Secondary | ICD-10-CM | POA: Insufficient documentation

## 2014-07-01 DIAGNOSIS — Z79899 Other long term (current) drug therapy: Secondary | ICD-10-CM | POA: Insufficient documentation

## 2014-07-01 DIAGNOSIS — I10 Essential (primary) hypertension: Secondary | ICD-10-CM | POA: Insufficient documentation

## 2014-07-01 DIAGNOSIS — R45851 Suicidal ideations: Secondary | ICD-10-CM | POA: Diagnosis present

## 2014-07-01 DIAGNOSIS — Z7982 Long term (current) use of aspirin: Secondary | ICD-10-CM | POA: Diagnosis not present

## 2014-07-01 DIAGNOSIS — Z8619 Personal history of other infectious and parasitic diseases: Secondary | ICD-10-CM | POA: Insufficient documentation

## 2014-07-01 DIAGNOSIS — Z862 Personal history of diseases of the blood and blood-forming organs and certain disorders involving the immune mechanism: Secondary | ICD-10-CM | POA: Insufficient documentation

## 2014-07-01 DIAGNOSIS — I5022 Chronic systolic (congestive) heart failure: Secondary | ICD-10-CM | POA: Insufficient documentation

## 2014-07-01 DIAGNOSIS — J4489 Other specified chronic obstructive pulmonary disease: Secondary | ICD-10-CM | POA: Insufficient documentation

## 2014-07-01 DIAGNOSIS — F1994 Other psychoactive substance use, unspecified with psychoactive substance-induced mood disorder: Secondary | ICD-10-CM | POA: Insufficient documentation

## 2014-07-01 DIAGNOSIS — F1410 Cocaine abuse, uncomplicated: Secondary | ICD-10-CM | POA: Insufficient documentation

## 2014-07-01 DIAGNOSIS — Z21 Asymptomatic human immunodeficiency virus [HIV] infection status: Secondary | ICD-10-CM | POA: Diagnosis not present

## 2014-07-01 DIAGNOSIS — F1010 Alcohol abuse, uncomplicated: Secondary | ICD-10-CM | POA: Diagnosis not present

## 2014-07-01 DIAGNOSIS — F17200 Nicotine dependence, unspecified, uncomplicated: Secondary | ICD-10-CM | POA: Diagnosis not present

## 2014-07-01 DIAGNOSIS — J449 Chronic obstructive pulmonary disease, unspecified: Secondary | ICD-10-CM | POA: Diagnosis not present

## 2014-07-01 DIAGNOSIS — Z8639 Personal history of other endocrine, nutritional and metabolic disease: Secondary | ICD-10-CM | POA: Insufficient documentation

## 2014-07-01 HISTORY — DX: Anxiety disorder, unspecified: F41.9

## 2014-07-01 HISTORY — DX: Post-traumatic stress disorder, unspecified: F43.10

## 2014-07-01 LAB — CBC
HCT: 45.1 % (ref 39.0–52.0)
HEMOGLOBIN: 16.5 g/dL (ref 13.0–17.0)
MCH: 33.4 pg (ref 26.0–34.0)
MCHC: 36.6 g/dL — ABNORMAL HIGH (ref 30.0–36.0)
MCV: 91.3 fL (ref 78.0–100.0)
Platelets: 149 10*3/uL — ABNORMAL LOW (ref 150–400)
RBC: 4.94 MIL/uL (ref 4.22–5.81)
RDW: 13.5 % (ref 11.5–15.5)
WBC: 2.9 10*3/uL — AB (ref 4.0–10.5)

## 2014-07-01 LAB — COMPREHENSIVE METABOLIC PANEL
ALK PHOS: 52 U/L (ref 39–117)
ALT: 29 U/L (ref 0–53)
AST: 33 U/L (ref 0–37)
Albumin: 3.7 g/dL (ref 3.5–5.2)
Anion gap: 12 (ref 5–15)
BUN: 17 mg/dL (ref 6–23)
CO2: 23 mEq/L (ref 19–32)
Calcium: 8.9 mg/dL (ref 8.4–10.5)
Chloride: 103 mEq/L (ref 96–112)
Creatinine, Ser: 1.11 mg/dL (ref 0.50–1.35)
GFR calc non Af Amer: 75 mL/min — ABNORMAL LOW (ref >90)
GFR, EST AFRICAN AMERICAN: 87 mL/min — AB (ref >90)
GLUCOSE: 90 mg/dL (ref 70–99)
POTASSIUM: 3.9 meq/L (ref 3.7–5.3)
SODIUM: 138 meq/L (ref 137–147)
Total Bilirubin: 1.3 mg/dL — ABNORMAL HIGH (ref 0.3–1.2)
Total Protein: 6.7 g/dL (ref 6.0–8.3)

## 2014-07-01 LAB — ETHANOL: Alcohol, Ethyl (B): <11 mg/dL (ref 0–11)

## 2014-07-01 LAB — RAPID URINE DRUG SCREEN, HOSP PERFORMED
Amphetamines: NOT DETECTED
BARBITURATES: NOT DETECTED
Benzodiazepines: NOT DETECTED
COCAINE: POSITIVE — AB
Opiates: NOT DETECTED
TETRAHYDROCANNABINOL: NOT DETECTED

## 2014-07-01 NOTE — ED Notes (Signed)
Pt states he has been having suicidal thoughts for the past few days  Pt states he plans to jump in front of a truck or bus  Pt states he has hx of same

## 2014-07-01 NOTE — ED Notes (Signed)
Pt belongings: umbrella, black book bag (personal items, wallet, black flip phone), set of clothes, brown shoes, belt

## 2014-07-02 ENCOUNTER — Inpatient Hospital Stay (HOSPITAL_COMMUNITY)
Admission: EM | Admit: 2014-07-02 | Discharge: 2014-07-14 | DRG: 885 | Disposition: A | Discharge status: Home / Self Care | Payer: No Typology Code available for payment source | Source: Intra-hospital | Attending: Psychiatry | Admitting: Psychiatry

## 2014-07-02 ENCOUNTER — Encounter (HOSPITAL_COMMUNITY): Payer: Self-pay | Admitting: Registered Nurse

## 2014-07-02 DIAGNOSIS — J449 Chronic obstructive pulmonary disease, unspecified: Secondary | ICD-10-CM | POA: Diagnosis present

## 2014-07-02 DIAGNOSIS — J4489 Other specified chronic obstructive pulmonary disease: Secondary | ICD-10-CM | POA: Diagnosis present

## 2014-07-02 DIAGNOSIS — F1010 Alcohol abuse, uncomplicated: Secondary | ICD-10-CM

## 2014-07-02 DIAGNOSIS — F1020 Alcohol dependence, uncomplicated: Secondary | ICD-10-CM | POA: Diagnosis present

## 2014-07-02 DIAGNOSIS — F39 Unspecified mood [affective] disorder: Secondary | ICD-10-CM

## 2014-07-02 DIAGNOSIS — F32A Depression, unspecified: Secondary | ICD-10-CM

## 2014-07-02 DIAGNOSIS — Z7982 Long term (current) use of aspirin: Secondary | ICD-10-CM

## 2014-07-02 DIAGNOSIS — F411 Generalized anxiety disorder: Secondary | ICD-10-CM | POA: Diagnosis present

## 2014-07-02 DIAGNOSIS — F319 Bipolar disorder, unspecified: Secondary | ICD-10-CM | POA: Diagnosis present

## 2014-07-02 DIAGNOSIS — I5022 Chronic systolic (congestive) heart failure: Secondary | ICD-10-CM | POA: Diagnosis present

## 2014-07-02 DIAGNOSIS — B20 Human immunodeficiency virus [HIV] disease: Secondary | ICD-10-CM

## 2014-07-02 DIAGNOSIS — F1994 Other psychoactive substance use, unspecified with psychoactive substance-induced mood disorder: Secondary | ICD-10-CM

## 2014-07-02 DIAGNOSIS — F332 Major depressive disorder, recurrent severe without psychotic features: Principal | ICD-10-CM | POA: Diagnosis present

## 2014-07-02 DIAGNOSIS — Z79899 Other long term (current) drug therapy: Secondary | ICD-10-CM

## 2014-07-02 DIAGNOSIS — F1420 Cocaine dependence, uncomplicated: Secondary | ICD-10-CM | POA: Diagnosis present

## 2014-07-02 DIAGNOSIS — I509 Heart failure, unspecified: Secondary | ICD-10-CM | POA: Diagnosis present

## 2014-07-02 DIAGNOSIS — Z21 Asymptomatic human immunodeficiency virus [HIV] infection status: Secondary | ICD-10-CM | POA: Diagnosis present

## 2014-07-02 DIAGNOSIS — E785 Hyperlipidemia, unspecified: Secondary | ICD-10-CM | POA: Diagnosis present

## 2014-07-02 DIAGNOSIS — F4310 Post-traumatic stress disorder, unspecified: Secondary | ICD-10-CM | POA: Diagnosis present

## 2014-07-02 DIAGNOSIS — F1410 Cocaine abuse, uncomplicated: Secondary | ICD-10-CM

## 2014-07-02 DIAGNOSIS — I10 Essential (primary) hypertension: Secondary | ICD-10-CM | POA: Diagnosis present

## 2014-07-02 DIAGNOSIS — I428 Other cardiomyopathies: Secondary | ICD-10-CM | POA: Diagnosis present

## 2014-07-02 DIAGNOSIS — Z598 Other problems related to housing and economic circumstances: Secondary | ICD-10-CM

## 2014-07-02 DIAGNOSIS — G4700 Insomnia, unspecified: Secondary | ICD-10-CM | POA: Diagnosis present

## 2014-07-02 DIAGNOSIS — Z5987 Material hardship due to limited financial resources, not elsewhere classified: Secondary | ICD-10-CM

## 2014-07-02 DIAGNOSIS — F17200 Nicotine dependence, unspecified, uncomplicated: Secondary | ICD-10-CM | POA: Diagnosis present

## 2014-07-02 DIAGNOSIS — R45851 Suicidal ideations: Secondary | ICD-10-CM

## 2014-07-02 DIAGNOSIS — Z8249 Family history of ischemic heart disease and other diseases of the circulatory system: Secondary | ICD-10-CM

## 2014-07-02 DIAGNOSIS — F329 Major depressive disorder, single episode, unspecified: Secondary | ICD-10-CM | POA: Diagnosis present

## 2014-07-02 MED ORDER — ALUM & MAG HYDROXIDE-SIMETH 200-200-20 MG/5ML PO SUSP: 30.0000 mL | ORAL | Status: DC | PRN

## 2014-07-02 MED ORDER — HYDROXYZINE HCL 25 MG PO TABS: 25.0000 mg | ORAL_TABLET | Freq: Three times a day (TID) | ORAL | Status: DC | PRN

## 2014-07-02 MED ORDER — ALBUTEROL SULFATE HFA 108 (90 BASE) MCG/ACT IN AERS: 2.0000 | INHALATION_SPRAY | RESPIRATORY_TRACT | Status: DC | PRN

## 2014-07-02 MED ORDER — MAGNESIUM HYDROXIDE 400 MG/5ML PO SUSP: 30.0000 mL | Freq: Every day | ORAL | Status: DC | PRN

## 2014-07-02 MED ORDER — ASPIRIN 81 MG PO CHEW
81.0000 mg | CHEWABLE_TABLET | Freq: Every day | ORAL | Status: DC
  Administered 2014-07-03: 81 mg via ORAL
  Filled 2014-07-02 (×3): qty 1

## 2014-07-02 MED ORDER — METOPROLOL SUCCINATE ER 25 MG PO TB24
25.0000 mg | ORAL_TABLET | Freq: Every day | ORAL | Status: DC
  Administered 2014-07-02: 25 mg via ORAL
  Filled 2014-07-02: qty 1

## 2014-07-02 MED ORDER — TRAZODONE HCL 50 MG PO TABS
75.0000 mg | ORAL_TABLET | Freq: Every day | ORAL | Status: DC
  Administered 2014-07-02: 75 mg via ORAL
  Filled 2014-07-02: qty 2

## 2014-07-02 MED ORDER — SPIRONOLACTONE 12.5 MG HALF TABLET
12.5000 mg | ORAL_TABLET | Freq: Every day | ORAL | Status: DC
  Administered 2014-07-02: 12.5 mg via ORAL
  Filled 2014-07-02: qty 1

## 2014-07-02 MED ORDER — NICOTINE 21 MG/24HR TD PT24
21.0000 mg | MEDICATED_PATCH | Freq: Every day | TRANSDERMAL | Status: DC
  Administered 2014-07-02: 21 mg via TRANSDERMAL
  Filled 2014-07-02: qty 1

## 2014-07-02 MED ORDER — DIGOXIN 125 MCG PO TABS
125.0000 ug | ORAL_TABLET | Freq: Every day | ORAL | Status: DC
  Administered 2014-07-02: 125 ug via ORAL
  Filled 2014-07-02: qty 1

## 2014-07-02 MED ORDER — ISOSORBIDE DINITRATE 5 MG PO TABS
5.0000 mg | ORAL_TABLET | Freq: Three times a day (TID) | ORAL | Status: DC
  Administered 2014-07-03 (×2): 5 mg via ORAL
  Filled 2014-07-02 (×4): qty 1

## 2014-07-02 MED ORDER — RALTEGRAVIR POTASSIUM 400 MG PO TABS
400.0000 mg | ORAL_TABLET | Freq: Two times a day (BID) | ORAL | Status: DC
  Administered 2014-07-02 (×2): 400 mg via ORAL
  Filled 2014-07-02 (×3): qty 1

## 2014-07-02 MED ORDER — VALACYCLOVIR HCL 500 MG PO TABS
500.0000 mg | ORAL_TABLET | Freq: Two times a day (BID) | ORAL | Status: DC
  Administered 2014-07-02 (×2): 500 mg via ORAL
  Filled 2014-07-02 (×3): qty 1

## 2014-07-02 MED ORDER — LISINOPRIL 5 MG PO TABS
5.0000 mg | ORAL_TABLET | Freq: Every day | ORAL | Status: DC
  Administered 2014-07-03: 5 mg via ORAL
  Filled 2014-07-02 (×3): qty 1

## 2014-07-02 MED ORDER — RALTEGRAVIR POTASSIUM 400 MG PO TABS
400.0000 mg | ORAL_TABLET | Freq: Two times a day (BID) | ORAL | Status: DC
  Administered 2014-07-02 – 2014-07-03 (×2): 400 mg via ORAL
  Filled 2014-07-02 (×6): qty 1

## 2014-07-02 MED ORDER — EMTRICITABINE-TENOFOVIR DF 200-300 MG PO TABS
1.0000 | ORAL_TABLET | Freq: Every day | ORAL | Status: DC
  Administered 2014-07-03: 1 via ORAL
  Filled 2014-07-02 (×3): qty 1

## 2014-07-02 MED ORDER — VALACYCLOVIR HCL 500 MG PO TABS
500.0000 mg | ORAL_TABLET | Freq: Two times a day (BID) | ORAL | Status: DC
  Administered 2014-07-02 – 2014-07-03 (×2): 500 mg via ORAL
  Filled 2014-07-02 (×6): qty 1

## 2014-07-02 MED ORDER — BUPROPION HCL ER (XL) 150 MG PO TB24
150.0000 mg | ORAL_TABLET | Freq: Every day | ORAL | Status: DC
  Administered 2014-07-03: 150 mg via ORAL
  Filled 2014-07-02 (×3): qty 1

## 2014-07-02 MED ORDER — BUPROPION HCL ER (XL) 150 MG PO TB24
150.0000 mg | ORAL_TABLET | Freq: Every day | ORAL | Status: DC
  Administered 2014-07-02: 150 mg via ORAL
  Filled 2014-07-02: qty 1

## 2014-07-02 MED ORDER — ASPIRIN 81 MG PO CHEW
81.0000 mg | CHEWABLE_TABLET | Freq: Every day | ORAL | Status: DC
  Administered 2014-07-02: 81 mg via ORAL
  Filled 2014-07-02: qty 1

## 2014-07-02 MED ORDER — ISOSORBIDE DINITRATE 5 MG PO TABS
5.0000 mg | ORAL_TABLET | Freq: Three times a day (TID) | ORAL | Status: DC
  Administered 2014-07-02 (×2): 5 mg via ORAL
  Filled 2014-07-02 (×3): qty 1

## 2014-07-02 MED ORDER — METOPROLOL SUCCINATE ER 25 MG PO TB24
25.0000 mg | ORAL_TABLET | Freq: Every day | ORAL | Status: DC
  Administered 2014-07-03: 25 mg via ORAL
  Filled 2014-07-02 (×3): qty 1

## 2014-07-02 MED ORDER — FUROSEMIDE 40 MG PO TABS
40.0000 mg | ORAL_TABLET | Freq: Every day | ORAL | Status: DC
  Administered 2014-07-02: 40 mg via ORAL
  Filled 2014-07-02: qty 1

## 2014-07-02 MED ORDER — POTASSIUM CHLORIDE CRYS ER 10 MEQ PO TBCR
10.0000 meq | EXTENDED_RELEASE_TABLET | Freq: Every day | ORAL | Status: DC
  Administered 2014-07-03: 10 meq via ORAL
  Filled 2014-07-02 (×3): qty 1

## 2014-07-02 MED ORDER — LISINOPRIL 5 MG PO TABS
5.0000 mg | ORAL_TABLET | Freq: Every day | ORAL | Status: DC
  Administered 2014-07-02: 5 mg via ORAL
  Filled 2014-07-02: qty 1

## 2014-07-02 MED ORDER — SPIRONOLACTONE 12.5 MG HALF TABLET
12.5000 mg | ORAL_TABLET | Freq: Every day | ORAL | Status: DC
  Administered 2014-07-03: 12.5 mg via ORAL
  Filled 2014-07-02 (×2): qty 1

## 2014-07-02 MED ORDER — POTASSIUM CHLORIDE CRYS ER 20 MEQ PO TBCR
10.0000 meq | EXTENDED_RELEASE_TABLET | Freq: Every day | ORAL | Status: DC
  Administered 2014-07-02: 10 meq via ORAL
  Filled 2014-07-02: qty 0.5

## 2014-07-02 MED ORDER — NICOTINE 21 MG/24HR TD PT24
21.0000 mg | MEDICATED_PATCH | Freq: Every day | TRANSDERMAL | Status: DC
  Administered 2014-07-03: 21 mg via TRANSDERMAL
  Filled 2014-07-02 (×3): qty 1

## 2014-07-02 MED ORDER — TRAZODONE HCL 50 MG PO TABS
75.0000 mg | ORAL_TABLET | Freq: Every day | ORAL | Status: DC
  Administered 2014-07-02: 75 mg via ORAL
  Filled 2014-07-02 (×4): qty 1

## 2014-07-02 MED ORDER — ACETAMINOPHEN 325 MG PO TABS: 650.0000 mg | ORAL_TABLET | Freq: Four times a day (QID) | ORAL | Status: DC | PRN

## 2014-07-02 MED ORDER — EMTRICITABINE-TENOFOVIR DF 200-300 MG PO TABS
1.0000 | ORAL_TABLET | Freq: Every day | ORAL | Status: DC
  Administered 2014-07-02: 1 via ORAL
  Filled 2014-07-02: qty 1

## 2014-07-02 MED ORDER — FUROSEMIDE 40 MG PO TABS
40.0000 mg | ORAL_TABLET | Freq: Every day | ORAL | Status: DC
  Administered 2014-07-03: 40 mg via ORAL
  Filled 2014-07-02 (×2): qty 1
  Filled 2014-07-02: qty 2

## 2014-07-02 MED ORDER — DIGOXIN 125 MCG PO TABS
125.0000 ug | ORAL_TABLET | Freq: Every day | ORAL | Status: DC
  Administered 2014-07-03: 125 ug via ORAL
  Filled 2014-07-02 (×3): qty 1
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
## 2014-07-02 NOTE — [facility] Counselor (Signed)
Per Thurman Coyer [facility] at [facility], pt has been accepted to OBS bed #7.

## 2014-07-02 NOTE — Progress Notes (Signed)
Patient was accompanied by [facility] to [facility] Observational unit. Patient is stable at discharge. Patient was calm and cooperative at transfer.
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
## 2014-07-02 NOTE — Progress Notes (Signed)
[facility] provided pt with a list of primary care resources, highlighting [facility]. Patient stated that he thought he had Medicaid/Medicare but wasn't for sure. Patient did state that he was staying in a shelter in [place].
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
## 2014-07-02 NOTE — ED Provider Notes (Signed)
CSN: [id number]     Arrival date & time 07/01/14  2003 History   First MD Initiated Contact with Patient 07/01/14 2358     Chief Complaint  Patient presents with  . Suicidal     (Consider location/radiation/quality/duration/timing/severity/associated sxs/prior Treatment) HPI Comments: Patient states he's been having suicidal thoughts for the past 2, days.  Has not acted on these thoughts recently in the past.  He has thought about stepping in front of cars.  He was hospitalized approximately one month ago for same.  Has not had a chance to followup as an outpatient, stating his appointment is not until July 27  The history is provided by the patient.    Past Medical History  Diagnosis Date  . HIV (human immunodeficiency virus infection)   . Chronic systolic heart failure   . Hypertension   . Genital herpes   . Active smoker   . AR (allergic rhinitis)   . HLD (hyperlipidemia)   . NICM (nonischemic cardiomyopathy)     Echocardiogram 06/28/11: EF 30-35%, mild MR, mild LAE;  No CAD by coronary CT angiogram 3/12 at [facility]  . NSVT (nonsustained ventricular tachycardia)   . Depression   . Bipolar 1 disorder   . Alcohol abuse   . Crack cocaine use   . COPD (chronic obstructive pulmonary disease)   . CHF (congestive heart failure)   . Anxiety   . PTSD (post-traumatic stress disorder)    History reviewed. No pertinent past surgical history. Family History  Problem Relation Age of Onset  . Hypertension Father   . CAD Father    History  Substance Use Topics  . Smoking status: Current Every Day Smoker -- 0.50 packs/day for 30 years    Types: Cigarettes  . Smokeless tobacco: Never Used     Comment: 10 ciggerettes daily  . Alcohol Use: No     Comment: pt reports he has nopt been using alcohol since his last admission    Review of Systems  Constitutional: Negative for fever.  Respiratory: Negative for shortness of breath.   Cardiovascular: Negative for chest  pain.  Neurological: Negative for dizziness and headaches.  Psychiatric/Behavioral: Positive for suicidal ideas.  All other systems reviewed and are negative.     Allergies  Review of patient's allergies indicates no known allergies.  Home Medications   Prior to Admission medications   Medication Sig Start Date End Date Taking? Authorizing Provider  aspirin 81 MG chewable tablet Chew 81 mg by mouth daily.   Yes Historical Provider, MD  buPROPion (WELLBUTRIN XL) 150 MG 24 hr tablet Take 150 mg by mouth daily.   Yes Historical Provider, MD  cetirizine (ZYRTEC) 10 MG tablet Take 10 mg by mouth daily.   Yes Historical Provider, MD  digoxin (LANOXIN) 0.125 MG tablet Take 1 tablet (125 mcg total) by mouth daily. For control of heart arrythmia. 04/04/12  Yes Verne Spurr, PA-C  emtricitabine-tenofovir (TRUVADA) 200-300 MG per tablet Take 1 tablet by mouth daily. 01/14/14  Yes Randall Hiss, MD  furosemide (LASIX) 40 MG tablet Take 1 tablet (40 mg total) by mouth daily. For edema 04/04/12  Yes Verne Spurr, PA-C  hydrOXYzine (ATARAX/VISTARIL) 25 MG tablet Take 25-50 mg by mouth 3 (three) times daily as needed for anxiety.   Yes Historical Provider, MD  isosorbide dinitrate (ISORDIL) 5 MG tablet Take 1 tablet (5 mg total) by mouth 3 (three) times daily. 01/02/14  Yes Nanine Means, NP  lisinopril (PRINIVIL,ZESTRIL) 5  MG tablet Take 1 tablet (5 mg total) by mouth daily. For hypertension 04/04/12  Yes Verne SpurrNeil Mashburn, PA-C  metoprolol succinate (TOPROL-XL) 25 MG 24 hr tablet Take 25 mg by mouth daily.   Yes Historical Provider, MD  Multiple Vitamin (MULTI-VITAMINS) TABS Take 1 tablet by mouth daily.   Yes Historical Provider, MD  nicotine (RA NICOTINE) 21 mg/24hr patch Place 21 mg onto the skin daily. 05/12/14  Yes Historical Provider, MD  potassium chloride (K-DUR,KLOR-CON) 10 MEQ tablet Take 1 tablet (10 mEq total) by mouth daily. 01/02/14  Yes Nanine MeansJamison [name], NP  raltegravir (ISENTRESS) 400 MG tablet  Take 1 tablet (400 mg total) by mouth 2 (two) times daily. 01/14/14  Yes Randall Hissornelius N Van Dam, MD  spironolactone (ALDACTONE) 25 MG tablet Take 0.5 tablets (12.5 mg total) by mouth daily. 01/02/14  Yes Nanine MeansJamison [name], NP  traZODone (DESYREL) 150 MG tablet Take 75 mg by mouth at bedtime.   Yes Historical Provider, MD  valACYclovir (VALTREX) 500 MG tablet Take 1 tablet (500 mg total) by mouth 2 (two) times daily. For Herpes. 04/04/12  Yes Verne SpurrNeil Mashburn, PA-C  albuterol (PROVENTIL HFA;VENTOLIN HFA) 108 (90 BASE) MCG/ACT inhaler Inhale 2 puffs into the lungs as needed for shortness of breath. Per pt med is as needed with no frequency for asthma. 04/04/12   Verne SpurrNeil Mashburn, PA-C   BP 125/82  Pulse 76  Temp(Src) 98 F (36.7 C) (Oral)  Resp 16  SpO2 97% Physical Exam  Nursing note and vitals reviewed. Constitutional: He is oriented to person, place, and time. He appears well-developed and well-nourished.  HENT:  Head: Normocephalic.  Eyes: Pupils are equal, round, and reactive to light.  Cardiovascular: Normal rate and regular rhythm.   Pulmonary/Chest: Effort normal and breath sounds normal.  Musculoskeletal: Normal range of motion.  Neurological: He is alert and oriented to person, place, and time.  Skin: Skin is warm and dry.  Psychiatric: His speech is normal and behavior is normal. He exhibits a depressed mood. He expresses suicidal ideation. He expresses no homicidal ideation. He expresses no homicidal plans.    ED Course  Procedures (including critical care time) Labs Review Labs Reviewed  CBC - Abnormal; Notable for the following:    WBC 2.9 (*)    MCHC 36.6 (*)    Platelets 149 (*)    All other components within normal limits  COMPREHENSIVE METABOLIC PANEL - Abnormal; Notable for the following:    Total Bilirubin 1.3 (*)    GFR calc non Af Amer 75 (*)    GFR calc Af Amer 87 (*)    All other components within normal limits  URINE RAPID DRUG SCREEN (HOSP PERFORMED) - Abnormal; Notable  for the following:    Cocaine POSITIVE (*)    All other components within normal limits  ETHANOL    Imaging Review No results found.   EKG Interpretation None      MDM   Final diagnoses:  None         Arman FilterGail K Jeena Arnett, NP 07/02/14 [id number]

## 2014-07-02 NOTE — Progress Notes (Signed)
[facility] INPATIENT:  Family/Significant Other Suicide Prevention Education  Suicide Prevention Education:  Patient Refusal for Family/Significant Other Suicide Prevention Education: The patient Ryan Long has refused to provide written consent for family/significant other to be provided Family/Significant Other Suicide Prevention Education during admission and/or prior to discharge.  Physician notified.  Wynona Luna 07/02/2014, 6:23 PM

## 2014-07-02 NOTE — Progress Notes (Signed)
Patient ID: Ryan Long, male   DOB: [birth year], 53 y.o.   MRN: [id number] Pt alert and oriented. C/o feeling depressed and hopeless. +SI/ no plan , verbally contracts for safety. -HI, -A/[place]. Denies pain or discomfort. Will continue to monitor closely and evaluate for stabilization.

## 2014-07-03 DIAGNOSIS — Z5989 Other problems related to housing and economic circumstances: Secondary | ICD-10-CM | POA: Diagnosis not present

## 2014-07-03 DIAGNOSIS — F319 Bipolar disorder, unspecified: Secondary | ICD-10-CM | POA: Diagnosis present

## 2014-07-03 DIAGNOSIS — F1020 Alcohol dependence, uncomplicated: Secondary | ICD-10-CM | POA: Diagnosis present

## 2014-07-03 DIAGNOSIS — Z598 Other problems related to housing and economic circumstances: Secondary | ICD-10-CM | POA: Diagnosis not present

## 2014-07-03 DIAGNOSIS — F411 Generalized anxiety disorder: Secondary | ICD-10-CM | POA: Diagnosis present

## 2014-07-03 DIAGNOSIS — J449 Chronic obstructive pulmonary disease, unspecified: Secondary | ICD-10-CM | POA: Diagnosis present

## 2014-07-03 DIAGNOSIS — R45851 Suicidal ideations: Secondary | ICD-10-CM

## 2014-07-03 DIAGNOSIS — F17200 Nicotine dependence, unspecified, uncomplicated: Secondary | ICD-10-CM | POA: Diagnosis present

## 2014-07-03 DIAGNOSIS — F1420 Cocaine dependence, uncomplicated: Secondary | ICD-10-CM | POA: Diagnosis present

## 2014-07-03 DIAGNOSIS — I428 Other cardiomyopathies: Secondary | ICD-10-CM | POA: Diagnosis present

## 2014-07-03 DIAGNOSIS — Z8249 Family history of ischemic heart disease and other diseases of the circulatory system: Secondary | ICD-10-CM | POA: Diagnosis not present

## 2014-07-03 DIAGNOSIS — Z79899 Other long term (current) drug therapy: Secondary | ICD-10-CM | POA: Diagnosis not present

## 2014-07-03 DIAGNOSIS — F332 Major depressive disorder, recurrent severe without psychotic features: Secondary | ICD-10-CM | POA: Diagnosis present

## 2014-07-03 DIAGNOSIS — Z21 Asymptomatic human immunodeficiency virus [HIV] infection status: Secondary | ICD-10-CM | POA: Diagnosis present

## 2014-07-03 DIAGNOSIS — Z7982 Long term (current) use of aspirin: Secondary | ICD-10-CM | POA: Diagnosis not present

## 2014-07-03 DIAGNOSIS — F329 Major depressive disorder, single episode, unspecified: Secondary | ICD-10-CM

## 2014-07-03 DIAGNOSIS — I509 Heart failure, unspecified: Secondary | ICD-10-CM | POA: Diagnosis present

## 2014-07-03 DIAGNOSIS — E785 Hyperlipidemia, unspecified: Secondary | ICD-10-CM | POA: Diagnosis present

## 2014-07-03 DIAGNOSIS — I10 Essential (primary) hypertension: Secondary | ICD-10-CM | POA: Diagnosis present

## 2014-07-03 DIAGNOSIS — Z5987 Material hardship: Secondary | ICD-10-CM | POA: Diagnosis not present

## 2014-07-03 DIAGNOSIS — I5022 Chronic systolic (congestive) heart failure: Secondary | ICD-10-CM | POA: Diagnosis present

## 2014-07-03 DIAGNOSIS — G4700 Insomnia, unspecified: Secondary | ICD-10-CM | POA: Diagnosis present

## 2014-07-03 DIAGNOSIS — F4310 Post-traumatic stress disorder, unspecified: Secondary | ICD-10-CM | POA: Diagnosis present

## 2014-07-03 MED ORDER — METOPROLOL SUCCINATE ER 25 MG PO TB24
25.0000 mg | ORAL_TABLET | Freq: Every day | ORAL | Status: DC
  Administered 2014-07-04 – 2014-07-11 (×7): 25 mg via ORAL
  Filled 2014-07-03 (×9): qty 1

## 2014-07-03 MED ORDER — DIGOXIN 125 MCG PO TABS
125.0000 ug | ORAL_TABLET | Freq: Every day | ORAL | Status: DC
  Administered 2014-07-04 – 2014-07-14 (×10): 125 ug via ORAL
  Filled 2014-07-03 (×13): qty 1

## 2014-07-03 MED ORDER — LISINOPRIL 5 MG PO TABS
5.0000 mg | ORAL_TABLET | Freq: Every day | ORAL | Status: DC
  Administered 2014-07-04 – 2014-07-14 (×10): 5 mg via ORAL
  Filled 2014-07-03 (×13): qty 1

## 2014-07-03 MED ORDER — ISOSORBIDE DINITRATE 5 MG PO TABS
5.0000 mg | ORAL_TABLET | Freq: Three times a day (TID) | ORAL | Status: DC
  Administered 2014-07-04 – 2014-07-14 (×27): 5 mg via ORAL
  Filled 2014-07-03 (×39): qty 1

## 2014-07-03 MED ORDER — HYDROXYZINE HCL 25 MG PO TABS
25.0000 mg | ORAL_TABLET | Freq: Three times a day (TID) | ORAL | Status: DC | PRN
Start: 2014-07-03 — End: 2014-07-14
  Administered 2014-07-03: 25 mg via ORAL
  Administered 2014-07-04 – 2014-07-05 (×2): 50 mg via ORAL
  Administered 2014-07-06: 25 mg via ORAL
  Administered 2014-07-06: 50 mg via ORAL
  Administered 2014-07-07 – 2014-07-08 (×4): 25 mg via ORAL
  Administered 2014-07-08: 50 mg via ORAL
  Administered 2014-07-09 – 2014-07-11 (×6): 25 mg via ORAL
  Filled 2014-07-03: qty 1
  Filled 2014-07-03: qty 2
  Filled 2014-07-03 (×2): qty 1
  Filled 2014-07-03: qty 2
  Filled 2014-07-03 (×9): qty 1
  Filled 2014-07-03 (×2): qty 2

## 2014-07-03 MED ORDER — ACETAMINOPHEN 325 MG PO TABS: 650.0000 mg | ORAL_TABLET | Freq: Four times a day (QID) | ORAL | Status: DC | PRN

## 2014-07-03 MED ORDER — EMTRICITABINE-TENOFOVIR DF 200-300 MG PO TABS
1.0000 | ORAL_TABLET | Freq: Every day | ORAL | Status: DC
  Administered 2014-07-04 – 2014-07-14 (×11): 1 via ORAL
  Filled 2014-07-03 (×13): qty 1

## 2014-07-03 MED ORDER — SPIRONOLACTONE 12.5 MG HALF TABLET
12.5000 mg | ORAL_TABLET | Freq: Every day | ORAL | Status: DC
  Administered 2014-07-04: 10:00:00 via ORAL
  Administered 2014-07-05 – 2014-07-11 (×6): 12.5 mg via ORAL
  Filled 2014-07-03 (×9): qty 1

## 2014-07-03 MED ORDER — RALTEGRAVIR POTASSIUM 400 MG PO TABS
400.0000 mg | ORAL_TABLET | Freq: Two times a day (BID) | ORAL | Status: DC
  Administered 2014-07-04 – 2014-07-14 (×19): 400 mg via ORAL
  Filled 2014-07-03 (×28): qty 1

## 2014-07-03 MED ORDER — NICOTINE 21 MG/24HR TD PT24
21.0000 mg | MEDICATED_PATCH | Freq: Every day | TRANSDERMAL | Status: DC
  Administered 2014-07-04 – 2014-07-14 (×10): 21 mg via TRANSDERMAL
  Filled 2014-07-03 (×14): qty 1

## 2014-07-03 MED ORDER — TRAZODONE HCL 50 MG PO TABS
75.0000 mg | ORAL_TABLET | Freq: Every day | ORAL | Status: DC
  Administered 2014-07-03 – 2014-07-12 (×9): 75 mg via ORAL
  Administered 2014-07-13: 23:00:00 via ORAL
  Filled 2014-07-03 (×13): qty 1

## 2014-07-03 MED ORDER — ASPIRIN 81 MG PO CHEW
81.0000 mg | CHEWABLE_TABLET | Freq: Every day | ORAL | Status: DC
  Administered 2014-07-04 – 2014-07-14 (×10): 81 mg via ORAL
  Filled 2014-07-03 (×14): qty 1

## 2014-07-03 MED ORDER — MAGNESIUM HYDROXIDE 400 MG/5ML PO SUSP
30.0000 mL | Freq: Every day | ORAL | Status: DC | PRN
  Administered 2014-07-04 – 2014-07-11 (×3): 30 mL via ORAL

## 2014-07-03 MED ORDER — POTASSIUM CHLORIDE CRYS ER 10 MEQ PO TBCR
10.0000 meq | EXTENDED_RELEASE_TABLET | Freq: Every day | ORAL | Status: DC
  Administered 2014-07-04 – 2014-07-14 (×10): 10 meq via ORAL
  Filled 2014-07-03 (×13): qty 1

## 2014-07-03 MED ORDER — ALUM & MAG HYDROXIDE-SIMETH 200-200-20 MG/5ML PO SUSP: 30.0000 mL | ORAL | Status: DC | PRN

## 2014-07-03 MED ORDER — ALBUTEROL SULFATE HFA 108 (90 BASE) MCG/ACT IN AERS: 2.0000 | INHALATION_SPRAY | RESPIRATORY_TRACT | Status: DC | PRN

## 2014-07-03 MED ORDER — BUPROPION HCL ER (XL) 150 MG PO TB24
150.0000 mg | ORAL_TABLET | Freq: Every day | ORAL | Status: DC
  Administered 2014-07-04 – 2014-07-06 (×3): 150 mg via ORAL
  Filled 2014-07-03 (×5): qty 1

## 2014-07-03 MED ORDER — VALACYCLOVIR HCL 500 MG PO TABS
500.0000 mg | ORAL_TABLET | Freq: Two times a day (BID) | ORAL | Status: DC
Start: 2014-07-03 — End: 2014-07-14
  Administered 2014-07-04 – 2014-07-14 (×19): 500 mg via ORAL
  Filled 2014-07-03 (×26): qty 1

## 2014-07-03 MED ORDER — FUROSEMIDE 40 MG PO TABS
40.0000 mg | ORAL_TABLET | Freq: Every day | ORAL | Status: DC
  Administered 2014-07-04 – 2014-07-11 (×7): 40 mg via ORAL
  Filled 2014-07-03: qty 2
  Filled 2014-07-03 (×9): qty 1
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
## 2014-07-03 NOTE — ED Provider Notes (Signed)
Medical screening examination/treatment/procedure(s) were performed by non-physician practitioner and as supervising physician I was immediately available for consultation/collaboration.    [name], MD 07/03/14 [phone]
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
## 2014-07-03 NOTE — Progress Notes (Signed)
[facility] Group Notes:  (Nursing/MHT/Case Management/Adjunct)  Date:  07/03/2014  Time:  9:52 PM  Type of Therapy:  Psychoeducational Skills  Participation Level:  Active  Participation Quality:  Attentive  Affect:  Depressed and Flat  Cognitive:  Lacking  Insight:  Lacking  Engagement in Group:  Lacking  Modes of Intervention:  Education  Summary of Progress/Problems: The patient indicated in group that he had a bad day overall. The patient was in no mood to expound any further. He did however mention that he felt more at ease today. As a theme for the day, his relapse prevention is to talk to people about his problems.   Ryan Long 07/03/2014, 9:52 PM

## 2014-07-03 NOTE — Progress Notes (Signed)
Patient discharged from observation unit. Admitted to [facility]. Report given to [facility], Charity fundraiser. Denies pain and discomfort upon transfer. Continuing to report SI. Verbally agreed to seek out staff if feeling unsafe.

## 2014-07-03 NOTE — Progress Notes (Signed)
Patient ID: Ryan Long, male   DOB: Sep 23, 1961, 53 y.o.   MRN: [id number]  D: Patient pleasant and cooperative with care. No inappropriate behaviors noted. Pt participated in group session. Pt with c/o anxiety and insomnia; prn meds given. A: Q 15 minute safety checks, encourage staff/peer interaction and group participation. Administer medications as ordered by MD.  R: Patient denies SI or plans to harm himself. Pt compliant with medications. No s/s of distress noted.
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
## 2014-07-03 NOTE — Progress Notes (Signed)
Patient admitted to 504-1. Oriented to unit. Offered nourishment. VSS.

## 2014-07-04 DIAGNOSIS — F1010 Alcohol abuse, uncomplicated: Secondary | ICD-10-CM

## 2014-07-04 DIAGNOSIS — F430 Acute stress reaction: Secondary | ICD-10-CM

## 2014-07-04 DIAGNOSIS — F4310 Post-traumatic stress disorder, unspecified: Secondary | ICD-10-CM

## 2014-07-04 DIAGNOSIS — F1910 Other psychoactive substance abuse, uncomplicated: Secondary | ICD-10-CM
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
## 2014-07-04 NOTE — Progress Notes (Signed)
Psychoeducational Group Note  Date:  07/04/2014 Time:   1100  Group Topic/Focus:  Identifying Needs:   The focus of this group is to help patients identify their personal needs that have been historically problematic and identify healthy behaviors to address their needs.  Participation Level: Did Not Attend  Participation Quality:  Not Applicable  Affect:  Not Applicable  Cognitive:  Not Applicable  Insight:  Not Applicable  Engagement in Group: Not Applicable  Additional Comments:    Rich Brave 07/04/2014, 12:37 PM
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
## 2014-07-04 NOTE — Progress Notes (Signed)
Patient ID: Ryan Long, male   DOB: [birth year], 53 y.o.   MRN: [id number] D)  Has been rather reserved this evening.  Attended group[ but didn't wish to share or participate.  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  Stated was doing OK tonight, no thoughts of self harm, still rather guarded, went to bed fairly early. A)  Will continue to monitor for safety, continue POC  R)  Safety maintained.

## 2014-07-04 NOTE — [facility] Group Notes (Signed)
[facility] Group Notes:  (Clinical Social Work)  07/04/2014   1:15-2:15PM  Summary of Progress/Problems:   The main focus of today's process group was for the patient to identify ways in which they have sabotaged their own mental health wellness/recovery.  Motivational interviewing and a handout were used to explore the benefits and costs of their self-sabotaging behavior as well as the benefits and costs of changing this behavior.  The Stages of Change were explained to the group using a handout, and patients identified where they are with regard to changing self-defeating behaviors.  The patient entered into the group at about the halfway point, and when called on refused to speak.  Type of Therapy:  Process Group  Participation Level: Minimal  Participation Quality:  Attentive  Affect:  Blunted and Depressed  Cognitive:  Oriented  Insight:  Limited  Engagement in Therapy:  Limited  Modes of Intervention:  Education, Motivational Interviewing   Ambrose Mantle, LCSW 07/04/2014, 4:00pm

## 2014-07-04 NOTE — [facility] Suicide Risk Assessment (Signed)
   Nursing information obtained from:    Demographic factors:   53 year old man, currently homeless, applying for disability Current Mental Status:   See below Loss Factors:   Homelessness, chronic medical issues Historical Factors:   History of depression Risk Reduction Factors:    Total Time spent with patient: 45 minutes  CLINICAL FACTORS:   Depression:   Anhedonia/ PTSD by history/ Cocaine Abuse   Psychiatric Specialty Exam: Physical Exam  ROS  Blood pressure 105/76, pulse 91, temperature 97.8 F (36.6 C), temperature source Oral, resp. rate 20, height 5\' 9"  (1.753 m), weight 90.946 kg (200 lb 8 oz), SpO2 98.00%.Body mass index is 29.6 kg/(m^2).  General Appearance: Fairly Groomed  Patent attorney::  Good  Speech:  Normal Rate  Volume:  Decreased  Mood:  Depressed  Affect:  Appropriate and Congruent- midldy constricted  Thought Process:  Goal Directed and Linear  Orientation:  NA- fully alert and attentive  Thought Content:  no hallucinations, no delusions. Ruminative about recent stressors  Suicidal Thoughts:  No denies any current suicidal plan or intention and contracts for safety on the unit  Homicidal Thoughts:  No  Memory:  NA  Judgement:  Fair  Insight:  Fair  Psychomotor Activity:  Normal  Concentration:  Good  Recall:  Good  Fund of Knowledge:Good  Language: Good  Akathisia:  Negative  Handed:  Right  AIMS (if indicated):     Assets:  Communication Skills Desire for Improvement Resilience  Sleep:  Number of Hours: 6.75   Musculoskeletal: Strength & Muscle Tone: within normal limits Gait & Station: normal Patient leans: N/A  COGNITIVE FEATURES THAT CONTRIBUTE TO RISK:  Closed-mindedness    SUICIDE RISK:   Moderate:  Frequent suicidal ideation with limited intensity, and duration, some specificity in terms of plans, no associated intent, good self-control, limited dysphoria/symptomatology, some risk factors present, and identifiable protective factors,  including available and accessible social support.  PLAN OF CARE:Patient will be admitted to inpatient psychiatric unit for stabilization and safety. Will provide and encourage milieu participation. Provide medication management and maked adjustments as needed.  Will follow daily.    I certify that inpatient services furnished can reasonably be expected to improve the patient's condition.  COBOS, FERNANDO 07/04/2014, 1:23 PM

## 2014-07-04 NOTE — Progress Notes (Signed)
Psychoeducational Group Note  Date:  07/04/2014 Time:  2110  Group Topic/Focus:  Wrap-Up Group:   The focus of this group is to help patients review their daily goal of treatment and discuss progress on daily workbooks.  Participation Level: Did Not Attend  Participation Quality:  Not Applicable  Affect:  Not Applicable  Cognitive:  Not Applicable  Insight:  Not Applicable  Engagement in Group: Not Applicable  Additional Comments:  The patient attended group, but he refused to share. Patient appeared to be upset and preoccupied.   Hazle Coca S 07/04/2014, 9:12 PM

## 2014-07-04 NOTE — Progress Notes (Addendum)
Pt refuses to discuss more about what he wrote on his morning assessment. This nurse spoke with counselor, who says pt acted similiarly with her when she tried to get more info regarding similar statements and that pt refused to talk  In group and refused 1:1  With her. Pt has agreed with this writer to stay safe and not to hurt himself while he is here and poc will continue.

## 2014-07-04 NOTE — [facility] Group Notes (Signed)
[facility] Group Notes:  (Nursing/MHT/Case Management/Adjunct)  Date:  07/04/2014  Time:  11:31 AM  Type of Therapy:  Psychoeducational Skills  Participation Level:  Active  Participation Quality:  Appropriate  Affect:  Appropriate  Cognitive:  Appropriate  Insight:  Appropriate  Engagement in Group:  Engaged  Modes of Intervention:  Discussion  Summary of Progress/Problems: Pt did attend self inventory group, pt reported that he was negative HI. Pt did report being positive SI but contracts for safety, positive AH/VH noted. Pt rated his depression as a 8, and his helplessness/hopelessness as a 8.     Pt reported concerns about not being on the right medication for depression and his racing thoughts, pt advised that the doctor will be made aware.   Jacquelyne Balint Shanta 07/04/2014, 11:31 AM

## 2014-07-04 NOTE — Progress Notes (Addendum)
Ryan Long is OOB AUL when he wants to ...he spends most time after breakfast, during groups and after lunch sleeping in his bed. HE is irritable and touchy when he is awakened for his lunch. He takes his medications as scheduled.    A He does complete his morning self assessment and on it he writes his feelings of depression and hopelessness and anxiety are as follows: " 8/8/8" and he feels he has experienced positive feelings of suicidal ideation, but he contracts to stay with staff, and he does not currently have a dc plan and knows he needs to begin to develop one   R Safety is in place and poc moves forward.
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
## 2014-07-05 NOTE — [facility] Group Notes (Signed)
[facility] Group Notes:  (Nursing/MHT/Case Management/Adjunct)  Date:  07/05/2014  Time:  10:52 AM  Type of Therapy:  Psychoeducational Skills  Participation Level:  Did Not Attend  Ryan Long 07/05/2014, 10:52 AM

## 2014-07-05 NOTE — Progress Notes (Signed)
  D: Pt observed sleeping in bed with eyes closed. RR even and unlabored. No distress noted  .  A: Q 15 minute checks were done for safety.  R: safety maintained on unit.  

## 2014-07-05 NOTE — Progress Notes (Signed)
Psychoeducational Group Note  Date:  07/05/2014 Time:  2115  Group Topic/Focus:  Wrap-Up Group:   The focus of this group is to help patients review their daily goal of treatment and discuss progress on daily workbooks.  Participation Level: Did Not Attend  Participation Quality:  Not Applicable  Affect:  Not Applicable  Cognitive:  Not Applicable  Insight:  Not Applicable  Engagement in Group: Not Applicable  Additional Comments:  The patient did not attend group this evening since he elected to sleep in his bed.   Hazle Coca S 07/05/2014, 9:16 PM

## 2014-07-05 NOTE — [facility] Group Notes (Signed)
[facility] Group Notes:  (Clinical Social Work)  07/05/2014   1:15-2:15PM  Summary of Progress/Problems:  The main focus of today's process group was to   identify the patient's current support system and decide on other supports that can be put in place.  The picture on workbook was used to discuss why additional supports are needed.  An emphasis was placed on using counselor, doctor, therapy groups, 12-step groups, and problem-specific support groups to expand supports.   There was also an extensive discussion about what constitutes a healthy support versus an unhealthy support.  The patient was in and out of the room numerous times, and "passed" when called on.  He was not at all engaged.  Type of Therapy:  Process Group  Participation Level:  None  Participation Quality:  Distracting  Affect:  Flat  Cognitive:  N/A  Insight:  None  Engagement in Therapy:  Limited  Modes of Intervention:  Education,  Support and [employer], LCSW 07/05/2014, 4:00pm

## 2014-07-05 NOTE — Progress Notes (Signed)
Psychoeducational Group Note  Date: 07/05/2014 Time: 1100  Group Topic/Focus:  Making Healthy Choices:   The focus of this group is to help patients identify negative/unhealthy choices they were using prior to admission and identify positive/healthier coping strategies to replace them upon discharge.  Participation Level:  Did Not Attend  PAdditional Comments:    07/05/2014,6:33 PM Rich Brave
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
## 2014-07-06 DIAGNOSIS — R45851 Suicidal ideations: Secondary | ICD-10-CM

## 2014-07-06 MED ORDER — BUPROPION HCL ER (XL) 300 MG PO TB24
300.0000 mg | ORAL_TABLET | Freq: Every day | ORAL | Status: DC
  Administered 2014-07-09 – 2014-07-11 (×3): 300 mg via ORAL
  Filled 2014-07-06 (×9): qty 1

## 2014-07-06 MED ORDER — BUPROPION HCL ER (XL) 150 MG PO TB24
150.0000 mg | ORAL_TABLET | Freq: Once | ORAL | Status: AC
  Administered 2014-07-06: 150 mg via ORAL
  Filled 2014-07-06: qty 1

## 2014-07-06 NOTE — Progress Notes (Signed)
Patient ID: Ryan Long, male   DOB: 1961/01/21, 53 y.o.   MRN: [id number] [facility] MD Progress Note  07/06/2014 5:50 PM [name]  MRN:  [id number] Subjective:  Pt seen and chart reviewed. Pt denies HI and AVH, although he does affirm SI with a plan to run in front of traffic. Pt reports that he will follow up at [facility]. Pt also reports high anxiety and depression, citing that the anxiety medications are helping but that he feels very low energy and still very depressed.   Diagnosis:  Major depressive disorder, recurrent, severe DSM5: Schizophrenia Disorders:   Obsessive-Compulsive Disorders:   Trauma-Stressor Disorders:   Substance/Addictive Disorders:   Depressive Disorders:  Major Depressive Disorder - Severe (296.23) Total Time spent with patient: 30 minutes  Axis I: Major Depression, Recurrent severe Axis II: Deferred Axis III:  Past Medical History  Diagnosis Date  . HIV (human immunodeficiency virus infection)   . Chronic systolic heart failure   . Hypertension   . Genital herpes   . Active smoker   . AR (allergic rhinitis)   . HLD (hyperlipidemia)   . NICM (nonischemic cardiomyopathy)     Echocardiogram 06/28/11: EF 30-35%, mild MR, mild LAE;  No CAD by coronary CT angiogram 3/12 at [facility]  . NSVT (nonsustained ventricular tachycardia)   . Depression   . Bipolar 1 disorder   . Alcohol abuse   . Crack cocaine use   . COPD (chronic obstructive pulmonary disease)   . CHF (congestive heart failure)   . Anxiety   . PTSD (post-traumatic stress disorder)    Axis IV: housing problems, other psychosocial or environmental problems, problems related to social environment and problems with primary support group Axis V: 41-50 serious symptoms  ADL's:  Intact  Sleep: Fair  Appetite:  Good  Suicidal Ideation:  Plan:  NONE Intent:  YES Means:  NONE Homicidal Ideation:  NA  Psychiatric Specialty Exam: Physical Exam  ROS  Blood pressure 104/69, pulse  90, temperature 97.5 F (36.4 C), temperature source Oral, resp. rate 18, height 5\' 9"  (1.753 m), weight 90.946 kg (200 lb 8 oz), SpO2 99.00%.Body mass index is 29.6 kg/(m^2).  General Appearance: Casual and Fairly Groomed  Eye Contact::  Good  Speech:  Clear and Coherent and Normal Rate  Volume:  Normal  Mood:  Angry, Depressed, Hopeless and Worthless  Affect:  Depressed and Flat  Thought Process:  Coherent, Goal Directed and Intact  Orientation:  Full (Time, Place, and Person)  Thought Content:  WDL  Suicidal Thoughts:  Yes.  with intent/plan  Homicidal Thoughts:  No  Memory:  Immediate;   Good Recent;   Good Remote;   Good  Judgement:  Good  Insight:  Good  Psychomotor Activity:  Normal  Concentration:  Good  Recall:  NA  Fund of Knowledge:Good  Language: Good  Akathisia:  NA  Handed:  Right  AIMS (if indicated):     Assets:  Desire for Improvement  Sleep:  Number of Hours: 6.75   Musculoskeletal: Strength & Muscle Tone: within normal limits Gait & Station: normal Patient leans: N/A  Current Medications: Current Facility-Administered Medications  Medication Dose Route Frequency Provider Last Rate Last Dose  . acetaminophen (TYLENOL) tablet 650 mg  650 mg Oral Q6H PRN Court Joy, PA-C      . albuterol (PROVENTIL HFA;VENTOLIN HFA) 108 (90 BASE) MCG/ACT inhaler 2 puff  2 puff Inhalation PRN Court Joy, PA-C      .  alum & mag hydroxide-simeth (MAALOX/MYLANTA) 200-200-20 MG/5ML suspension 30 mL  30 mL Oral Q4H PRN Court Joyharles E Kober, PA-C      . aspirin chewable tablet 81 mg  81 mg Oral Daily Court Joyharles E Kober, PA-C   81 mg at 07/06/14 [id number]  . buPROPion (WELLBUTRIN XL) 24 hr tablet 150 mg  150 mg Oral Once Beau FannyJohn C Emmalyn Hinson, FNP      . [START ON 07/07/2014] buPROPion (WELLBUTRIN XL) 24 hr tablet 300 mg  300 mg Oral Daily Beau FannyJohn C Kalik Hoare, FNP      . digoxin (LANOXIN) tablet 125 mcg  125 mcg Oral Daily Court JoyCharles E Kober, PA-C   125 mcg at 07/06/14 [id number]  . emtricitabine-tenofovir  (TRUVADA) 200-300 MG per tablet 1 tablet  1 tablet Oral Daily Court Joyharles E Kober, PA-C   1 tablet at 07/06/14 [phone]  . furosemide (LASIX) tablet 40 mg  40 mg Oral Daily Court Joyharles E Kober, PA-C   40 mg at 07/06/14 [id number]  . hydrOXYzine (ATARAX/VISTARIL) tablet 25-50 mg  25-50 mg Oral TID PRN Court Joyharles E Kober, PA-C   25 mg at 07/06/14 1326  . isosorbide dinitrate (ISORDIL) tablet 5 mg  5 mg Oral TID Court Joyharles E Kober, PA-C   5 mg at 07/06/14 1707  . lisinopril (PRINIVIL,ZESTRIL) tablet 5 mg  5 mg Oral Daily Court Joyharles E Kober, PA-C   5 mg at 07/06/14 0843  . magnesium hydroxide (MILK OF MAGNESIA) suspension 30 mL  30 mL Oral Daily PRN Court Joyharles E Kober, PA-C   30 mL at 07/05/14 1254  . metoprolol succinate (TOPROL-XL) 24 hr tablet 25 mg  25 mg Oral Daily Court Joyharles E Kober, PA-C   25 mg at 07/06/14 0841  . nicotine (NICODERM CQ - dosed in mg/24 hours) patch 21 mg  21 mg Transdermal Daily Court Joyharles E Kober, PA-C   21 mg at 07/06/14 0844  . potassium chloride (K-DUR,KLOR-CON) CR tablet 10 mEq  10 mEq Oral Daily Court Joyharles E Kober, PA-C   10 mEq at 07/06/14 0840  . raltegravir (ISENTRESS) tablet 400 mg  400 mg Oral BID Court Joyharles E Kober, PA-C   400 mg at 07/06/14 1707  . spironolactone (ALDACTONE) tablet 12.5 mg  12.5 mg Oral Daily Court Joyharles E Kober, PA-C   12.5 mg at 07/06/14 0841  . traZODone (DESYREL) tablet 75 mg  75 mg Oral QHS Court Joyharles E Kober, PA-C   75 mg at 07/04/14 2115  . valACYclovir (VALTREX) tablet 500 mg  500 mg Oral BID Court Joyharles E Kober, PA-C   500 mg at 07/06/14 1707    Lab Results: No results found for this or any previous visit (from the past 48 hour(s)).  Physical Findings: AIMS: Facial and Oral Movements Muscles of Facial Expression: None, normal Lips and Perioral Area: None, normal Jaw: None, normal Tongue: None, normal,Extremity Movements Upper (arms, wrists, hands, fingers): None, normal Lower (legs, knees, ankles, toes): None, normal, Trunk Movements Neck, shoulders, hips: None, normal, Overall  Severity Severity of abnormal movements (highest score from questions above): None, normal Incapacitation due to abnormal movements: None, normal Patient's awareness of abnormal movements (rate only patient's report): No Awareness, Dental Status Current problems with teeth and/or dentures?: No Does patient usually wear dentures?: No  CIWA:    COWS:     Treatment Plan Summary: Daily contact with patient to assess and evaluate symptoms and progress in treatment Medication management  Plan: Continue with plan of care Continue crisis management Encourage to participate in group and individual sessions Continue  medication management/ and review as needed Discharge  Plan in progress Address health issues /V/S as needed  -Increase Wellbutrin to 300mg  XR daily for depression    Medical Decision Making Problem Points:  Established problem, stable/improving (1) Data Points:  Review and summation of old records (2) Review of medication regiment & side effects (2)  I certify that inpatient services furnished can reasonably be expected to improve the patient's condition.   Beau Fanny, FNP-BC  07/06/2014, 5:50 PM

## 2014-07-06 NOTE — [facility] Group Notes (Signed)
[facility] LCSW Aftercare Discharge Planning Group Note  07/06/2014 8:45 AM  Pt did not attend group, sleeping in room.  Chad Cordial, LCSWA 07/06/2014 10:09 AM

## 2014-07-06 NOTE — [facility] Group Notes (Signed)
[facility] LCSW Group Therapy  07/06/2014 1:15pm   Type of Therapy: Group Therapy- Overcoming Obstacles  Participation Level: Minimal  Participation Quality:  Inattentive and Resistant  Affect:  Flat   Cognitive: Alert and Oriented   Insight:  Unknown due to lack of participation    Engagement in Therapy: Developing/Improving and Engaged   Modes of Intervention: Clarification, Confrontation, Discussion, Education, Exploration, Limit-setting, Orientation, Problem-solving, Rapport Building, Dance movement psychotherapist, Socialization and Support   Summary of Progress/Problems: Pt did not participate in group discussion about obstacles, barriers to overcoming those obstacles, and motivation to change or overcome those obstacles.  Even when prompted, Pt declined to answer stating "I'll pass."  Pt maintained poor eye contact and was disengaged during discussion.    Therapeutic Modalities:   Cognitive Behavioral Therapy Solution Focused Therapy Motivational Interviewing Relapse Prevention Therapy  Chad Cordial, LCSWA 07/06/2014 3:09 PM
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
## 2014-07-06 NOTE — Progress Notes (Signed)
D: Pt's mood is sad and affect is flat.  Pt states SI and pt contracts for safety.   Pt denies pain.    A: Patient given emotional support from RN. Patient encouraged to come to staff with concerns and/or questions. Patient's medication routine continued. Patient's orders and plan of care reviewed. Will continue to monitor patient q15 minutes for safety.   R: Patient remains appropriate and cooperative.

## 2014-07-07 DIAGNOSIS — F322 Major depressive disorder, single episode, severe without psychotic features: Secondary | ICD-10-CM

## 2014-07-07 MED ORDER — SERTRALINE HCL 25 MG PO TABS
25.0000 mg | ORAL_TABLET | Freq: Every day | ORAL | Status: DC
  Administered 2014-07-07 – 2014-07-09 (×2): 25 mg via ORAL
  Filled 2014-07-07 (×6): qty 1
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
## 2014-07-07 NOTE — Progress Notes (Signed)
Was in group but declined to talk.

## 2014-07-07 NOTE — Progress Notes (Signed)
Pt resting in bed with eyes closed.  No distress observed.  Respirations even/unlabored.  Safety maintained with q15 minute checks. 

## 2014-07-07 NOTE — Progress Notes (Signed)
Patient ID: Ryan BabinskiBarry Long, male   DOB: December 19, 1960, 53 y.o.   MR[id number] [facility]  07/07/2014 5:16 PM Ryan BabinskiBarry Long  MRN:  [id number] Subjective:  States he still pretty depressed, sad. Objective : Patient states he is still depressed, sad, but not suicidal. He is going to groups. No disruptive behaviors on unit. No side effects from medications. He has been worried about low BP but we reviewed BP readings since admission and they have been consistent. Today  93/66 with a pulse of 80x.  Energy level low, appetite fair, sleep has improved.    Diagnosis:  Major depressive disorder, recurrent, severe  Depressive Disorders:  Major Depressive Disorder - Severe (296.23) Total Time spent with patient: 20 minutes  Axis I: Major Depression, Recurrent severe Axis II: Deferred Axis III:  Past Medical History  Diagnosis Date  . HIV (human immunodeficiency virus infection)   . Chronic systolic heart failure   . Hypertension   . Genital herpes   . Active smoker   . AR (allergic rhinitis)   . HLD (hyperlipidemia)   . NICM (nonischemic cardiomyopathy)     Echocardiogram 06/28/11: EF 30-35%, mild MR, mild LAE;  [facility]  . NSVT (nonsustained ventricular tachycardia)   . Depression   . Bipolar 1 disorder   . Alcohol abuse   . Crack cocaine use   . COPD (chronic obstructive pulmonary disease)   . CHF (congestive heart failure)   . Anxiety   . PTSD (post-traumatic stress disorder)    Axis IV: housing problems, other psychosocial or environmental problems, problems related to social environment and problems with primary support group Axis V: 41-50 serious symptoms  ADL's:  Intact  Sleep: Fair  Appetite:  Good  Suicidal Ideation:  Denies any suicidal ideations at this time Homicidal Ideation:  NA  Psychiatric Specialty Exam: Physical Exam  Review of Systems  Constitutional: Negative for fever and chills.   Respiratory: Negative for cough and shortness of breath.   Cardiovascular: Negative for chest pain and palpitations.  Skin: Negative for rash.  Neurological: Negative for headaches.  Psychiatric/Behavioral: Positive for depression. Negative for hallucinations.    Blood pressure 93/66, pulse 80, temperature 97.4 F (36.3 C), temperature source Oral, resp. rate 18, height 5\' 9"  (1.753 m), weight 90.946 kg (200 lb 8 oz), SpO2 99.00%.Body mass index is 29.6 kg/(m^2).  General Appearance: Fairly Groomed  Patent attorneyye Contact::  Good  Speech:  Clear and Coherent  Volume:  Decreased  Mood:  Depressed  Affect:  Depressed and Flat  Thought Process:  Coherent, Goal Directed and Intact  Orientation:  Full (Time, Place, and Person)  Thought Content:  WDL and denies any hallucinations, no delusions expressed   Suicidal Thoughts:  Yes.  without intent/plan- at this time contracts for safety and denies any plan or intention of hurting himself and contracts for safety on the unit.   Homicidal Thoughts:  No  Memory:  NA  Judgement:  Fair  Insight:  Fair  Psychomotor Activity:  Normal  Concentration:  Good  Recall:  NA  Fund of Knowledge:Good  Language: Good  Akathisia:  NA  Handed:  Right  AIMS (if indicated):     Assets:  Desire for Improvement  Sleep:  Number of Hours: 6.75   Musculoskeletal: Strength & Muscle Tone: within normal limits Gait & Station: normal Patient leans: N/A  Current Medications: Current Facility-Administered Medications  Medication Dose Route Frequency Provider Last  Rate Last Dose  . acetaminophen (TYLENOL) tablet 650 mg  650 mg Oral Q6H PRN Court Joy, PA-C      . albuterol (PROVENTIL HFA;VENTOLIN HFA) 108 (90 BASE) MCG/ACT inhaler 2 puff  2 puff Inhalation PRN Court Joy, PA-C      . alum & mag hydroxide-simeth (MAALOX/MYLANTA) 200-200-20 MG/5ML suspension 30 mL  30 mL Oral Q4H PRN Court Joy, PA-C      . aspirin chewable tablet 81 mg  81 mg Oral Daily  Court Joy, PA-C   81 mg at 07/07/14 1323  . buPROPion (WELLBUTRIN XL) 24 hr tablet 300 mg  300 mg Oral Daily Beau Fanny, FNP      . digoxin (LANOXIN) tablet 125 mcg  125 mcg Oral Daily Court Joy, PA-C   125 mcg at 07/07/14 1326  . emtricitabine-tenofovir (TRUVADA) 200-300 MG per tablet 1 tablet  1 tablet Oral Daily Court Joy, PA-C   1 tablet at 07/07/14 1323  . furosemide (LASIX) tablet 40 mg  40 mg Oral Daily Court Joy, PA-C   40 mg at 07/07/14 1322  . hydrOXYzine (ATARAX/VISTARIL) tablet 25-50 mg  25-50 mg Oral TID PRN Court Joy, PA-C   25 mg at 07/07/14 1335  . isosorbide dinitrate (ISORDIL) tablet 5 mg  5 mg Oral TID Court Joy, PA-C   5 mg at 07/07/14 1709  . lisinopril (PRINIVIL,ZESTRIL) tablet 5 mg  5 mg Oral Daily Court Joy, PA-C   5 mg at 07/07/14 1330  . magnesium hydroxide (MILK OF MAGNESIA) suspension 30 mL  30 mL Oral Daily PRN Court Joy, PA-C   30 mL at 07/05/14 1254  . metoprolol succinate (TOPROL-XL) 24 hr tablet 25 mg  25 mg Oral Daily Court Joy, PA-C   25 mg at 07/07/14 1330  . nicotine (NICODERM CQ - dosed in mg/24 hours) patch 21 mg  21 mg Transdermal Daily Court Joy, PA-C   21 mg at 07/07/14 1324  . potassium chloride (K-DUR,KLOR-CON) CR tablet 10 mEq  10 mEq Oral Daily Court Joy, PA-C   10 mEq at 07/07/14 1323  . raltegravir (ISENTRESS) tablet 400 mg  400 mg Oral BID Court Joy, PA-C   400 mg at 07/07/14 1321  . spironolactone (ALDACTONE) tablet 12.5 mg  12.5 mg Oral Daily Court Joy, PA-C   12.5 mg at 07/07/14 1324  . traZODone (DESYREL) tablet 75 mg  75 mg Oral QHS Court Joy, PA-C   75 mg at 07/06/14 2123  . valACYclovir (VALTREX) tablet 500 mg  500 mg Oral BID Court Joy, PA-C   500 mg at 07/07/14 1321    Lab Results: No results found for this or any previous visit (from the past 48 hour(s)).  Physical Findings: AIMS: Facial and Oral Movements Muscles of Facial Expression: None,  normal Lips and Perioral Area: None, normal Jaw: None, normal Tongue: None, normal,Extremity Movements Upper (arms, wrists, hands, fingers): None, normal Lower (legs, knees, ankles, toes): None, normal, Trunk Movements Neck, shoulders, hips: None, normal, Overall Severity Severity of abnormal movements (highest score from questions above): None, normal Incapacitation due to abnormal movements: None, normal Patient's awareness of abnormal movements (rate only patient's report): No Awareness, Dental Status Current problems with teeth and/or dentures?: No Does patient usually wear dentures?: No  CIWA:    COWS:     Assessment:  Patient remains depressed. He is not suicidal or psychotic.  He has been on Wellbutrin with some response and prefers this medication due to its lack of sexual side effects. He has been on Wellbutrin for years. However, as noted, he has a history of cardiac illness/ CHF, but it appears Wellbutrin has been well tolerated and has not caused vital sign changes. He may benefit from adding an SSRI to address depression, and some PTSD type symptoms he reports. Treatment Plan Summary: Daily contact with patient to assess and evaluate symptoms and progress in treatment Medication management  Plan: Continue with plan of care Continue crisis management  - Wellbutrin to 300mg  XR daily  - Start Zoloft 25 mgrs QDAY initially     Medical Decision Making Problem Points:  Established problem, stable/improving (1) Data Points:  Review of medication regiment & side effects (2) Review of new medications or change in dosage (2)  I certify that inpatient services furnished can reasonably be expected to improve the patient's condition.   Nehemiah Massed, FNP-BC  07/07/2014, 5:16 PM

## 2014-07-07 NOTE — Progress Notes (Signed)
Recreation Therapy Notes  Animal-Assisted Activity/Therapy (AAA/T) Program Checklist/Progress Notes Patient Eligibility Criteria Checklist & Daily Group note for Rec Tx Intervention  Date: 07.21.2015 Time: 3:15pm Location: [address] Dayroom    AAA/T Program Assumption of Risk Form signed by Patient/ or Parent Legal Guardian yes  Patient is free of allergies or sever asthma yes  Patient reports no fear of animals yes  Patient reports no history of cruelty to animals yes   Patient understands his/her participation is voluntary yes  Behavioral Response: Did not attend.   Reather Steller L Mckinlee Dunk, LRT/CTRS        Gelisa Tieken L 07/07/2014 4:45 PM 

## 2014-07-07 NOTE — Progress Notes (Signed)
Patient ID: Ryan Long, male   DOB: 10/30/1961, 53 y.o.   MRN: [id number] Plood pressure was in normal range after lunch and patient trok all his am meds except wellbutrin.  He wants to talk with MD about the possibility that increase in wellbutrin caused drop in BP

## 2014-07-07 NOTE — [facility] Suicide Risk Assessment (Signed)
[facility] INPATIENT:  Family/Significant Other Suicide Prevention Education  Suicide Prevention Education:  Patient Refusal for Family/Significant Other Suicide Prevention Education: The patient Ryan Long has refused to provide written consent for family/significant other to be provided Family/Significant Other Suicide Prevention Education during admission and/or prior to discharge.  Physician notified.  Elaina Hoops 07/07/2014, 8:31 AM

## 2014-07-07 NOTE — [facility] Group Notes (Signed)
[facility] LCSW Group Therapy 07/07/2014 1:15 PM  Type of Therapy: Group Therapy- Feelings about Diagnosis  Participation Level: Minimal  Participation Quality:  Reserved  Affect:  Depressed and Flat   Cognitive: Alert and Oriented   Insight:  Unknown as Pt does not participate   Engagement in Therapy: Developing/Improving and Engaged   Modes of Intervention: Clarification, Confrontation, Discussion, Education, Exploration, Limit-setting, Orientation, Problem-solving, Rapport Building, Dance movement psychotherapist, Socialization and Support  Description of Group:   This group will allow patients to explore their thoughts and feelings about diagnoses they have received. Patients will be guided to explore their level of understanding and acceptance of these diagnoses. Facilitator will encourage patients to process their thoughts and feelings about the reactions of others to their diagnosis, and will guide patients in identifying ways to discuss their diagnosis with significant others in their lives. This group will be process-oriented, with patients participating in exploration of their own experiences as well as giving and receiving support and challenge from other group members.  Summary of Progress/Problems: The topic for group therapy was feelings about diagnosis. Pt did not participate in group discussion on feelings around diagnosis.  Pt did not demonstrate understanding of his diagnosis or any feelings he has pertaining to it.  Pt was disengaged as he made no eye contact with peers and maintained a blank stare for the entirety of the group session.   Therapeutic Modalities:   Cognitive Behavioral Therapy Solution Focused Therapy Motivational Interviewing Relapse Prevention Therapy  Chad Cordial, LCSWA 07/07/2014 4:52 PM

## 2014-07-07 NOTE — Progress Notes (Signed)
Patient ID: Ryan Long, male   DOB: [birth year], 52 y.o.   MRN: [id number] D- Patients BP was low this am.  A- Rechecked it manually and it continues to be low.  Told patient that BP meds are being held until BP is higher.  Encouraged him to drink more fluids and gave him large ice water to drink.  R Patient declined to take other meds' "I,ll take them all together"  He has not been drinking water as requested. Offered him another type of drink.  He declined

## 2014-07-07 NOTE — [facility] Group Notes (Signed)
[facility] Group Notes:  orientation  Date:  07/07/2014  Time:  10:00 AM  Type of Therapy:  Nurse Education  Participation Level:  None  Participation Quality:  Inattentive  Affect:  Flat  Cognitive:  Lacking  Insight:  None  Engagement in Group:  None  Modes of Intervention:  Discussion  Summary of Progress/ProblemsPt did not attend group  Ryan Long 07/07/2014, 10:00 AM
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
## 2014-07-08 NOTE — Progress Notes (Signed)
D: Patient presents today with blunted, irritable affect and depressed mood. Patient had a low B/P this morning, which was rechecked yielding the same results, so the Lopressor had to be held this morning. Patient verbalized that he was not going to take any of his morning medications if he couldn't get that specific one as well, so he refused them all, including the noon medications. Also, he voiced that he will come take his medications later when he feels ready to take them. Patient just got out of bed around 3:30 pm. He continues to have irritable affect and mood; requested medication for anxiety. No attendance in groups today and did not go to the cafeteria for lunch.  A: Support and encouragement provided to patient. Maintain Q15 minute checks for safety.  R: Patient receptive. Endorses SI, contracts for safety. Denies HI. Patient remains safe.

## 2014-07-08 NOTE — Clinical Social Work Note (Signed)
CSW met with pt individually at this time.  Pt is concerned about his d/c plans.  Pt states that he doesn't want to return to the shelter he was staying at prior to admission, [facility] in [place].  Pt is okay with relocating to [facility] in [place] or [facility] in [place], both homeless shelters.  CSW provided pt with numbers for both and encouraged pt to go ahead and call for bed availability, in anticipation of d/c tomorrow.   Regan Lemming, LCSW 07/08/2014  1:22 PM

## 2014-07-08 NOTE — [facility] Group Notes (Signed)
[facility] LCSW Aftercare Discharge Planning Group Note   07/08/2014  8:45 AM  Participation Quality:  Did Not Attend - pt sleeping in his room  Reyes Ivan, LCSW 07/08/2014 9:42 AM

## 2014-07-09 MED ORDER — SERTRALINE HCL 50 MG PO TABS
50.0000 mg | ORAL_TABLET | Freq: Every day | ORAL | Status: DC
  Administered 2014-07-10 – 2014-07-11 (×2): 50 mg via ORAL
  Filled 2014-07-09 (×3): qty 1
  Filled 2014-07-09: qty 4
  Filled 2014-07-09 (×4): qty 1

## 2014-07-09 NOTE — Progress Notes (Signed)
D Pt. Reports passive SI but contracts for safety. NO complaints of pain or discomfort noted.  A Writer offered support and encouragement. Discussed coping skills with pt.  R Pt. Remains safe on the unit,  Writer gave pt. A journal and discussed walking and deep breathing exercises with pt.
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
## 2014-07-09 NOTE — [facility] Group Notes (Signed)
[facility] LCSW Group Therapy  07/09/2014  1:15 PM   Type of Therapy:  Group Therapy  Participation Level:  Minimal  Participation Quality:  Attentive  Affect:  Depressed and Flat  Cognitive:  Alert and Oriented  Insight:  Limited, Lacking  Engagement in Therapy:  Limited, Lacking  Modes of Intervention:  Clarification, Confrontation, Discussion, Education, Exploration, Limit-setting, Orientation, Problem-solving, Rapport Building, Dance movement psychotherapist, Socialization and Support  Summary of Progress/Problems: The topic for group was balance in life.  Today's group focused on defining balance in one's own words, identifying things that can knock one off balance, and exploring healthy ways to maintain balance in life. Group members were asked to provide an example of a time when they felt off balance, describe how they handled that situation,and process healthier ways to regain balance in the future. Group members were asked to share the most important tool for maintaining balance that they learned while at [facility] and how they plan to apply this method after discharge.  Pt sat quietly through group and chose not to share when called upon.  Pt appeared to actively listen to group discussion.    Reyes Ivan, LCSW 07/09/2014  2:35 PM

## 2014-07-09 NOTE — Progress Notes (Signed)
Pt mood sullen, irritable at times but cooperative with staff.  Pt stayed in bed and only came out of room for medication at HS and to wash clothing.  Pt reported passive SI and anxiety. Pt verbally contracts for safety.   Pt was encouraged to attend group by writer but he declined.  Support and encouragement given, pt receptive.  Pt remains safe on the unit.

## 2014-07-09 NOTE — [facility] Group Notes (Signed)
[facility] Group Notes:  (Nursing/MHT/Case Management/Adjunct)  Date:  07/09/2014  Time:  10:52 AM  Type of Therapy:  Nurse Education  Participation Level:  Did Not Attend  Participation Quality:  Did not attend  Affect:  Did not attend  Cognitive:  Did not attend  Insight:  None  Engagement in Group:  None  Modes of Intervention:  Did not attend  Summary of Progress/Problems:  Larina Earthly 07/09/2014, 10:52 AM
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
## 2014-07-10 DIAGNOSIS — F332 Major depressive disorder, recurrent severe without psychotic features: Principal | ICD-10-CM

## 2014-07-10 NOTE — Progress Notes (Signed)
Patient ID: Ryan Long, male   DOB: 08-24-1961, 53 y.o.   MRN: [id number]  Pt resting in bed easily awaken. Minimum interaction with staff or peers. Refused group. +SI/ no plan; -HI, -A/[place]. Verbally contracts for safety. Denies pain or discomfort. Medication given as ordered. Monitored Q . Will continue to monitor and evaluate for stabilization.

## 2014-07-10 NOTE — Progress Notes (Signed)
D: Patient denies SI/HI and A/V hallucinations; patient reports some dizziness upon standing and only when standing; patient blood pressure was 89/64 and pulse was 102 when standing; patient also rates depression 6/10, hopelessness 6/10, anxiety 5/10; patient stated per self inventory" my mind keeps wandering on many different things and most are negative. Sometimes I feel overwhelmed; the same problems/thoughts I've been having. Dont like to talk/think about it, but they wont stop coming in my mind"  A: Monitored q 15 minutes; patient encouraged to attend groups; patient educated about medications; patient given medications per physician orders; patient encouraged to express feelings and/or concerns; patient was given fluids and escorted back to his bed; blood pressure and pulse will be reassessed  R: Patient is flat and blunted; patient has been in the bed all day; patient is isolative and forwards little information; blood pressure.... And pulse is.......; patient is still laying in the bed and no longer reporting symptoms of dizziness; patient has not attended any groups

## 2014-07-10 NOTE — [facility] Group Notes (Signed)
[facility] LCSW Group Therapy 06/29/2014 1:15 PM   Type of Therapy: Group Therapy  Participation Level: Did Not Attend    Samuella Bruin, MSW, [facility] Clinical Social Worker [facility] [phone]

## 2014-07-10 NOTE — Tx Team (Signed)
Interdisciplinary Treatment Plan Update (Adult)  Date: 07/10/2014  Time Reviewed:  9:45 AM  Progress in Treatment: Attending groups: Yes Participating in groups:  Yes Taking medication as prescribed:  Yes Tolerating medication:  Yes Family/Significant othe contact made: No, pt refused Patient understands diagnosis:  Yes Discussing patient identified problems/goals with staff:  Yes Medical problems stabilized or resolved:  Yes Denies suicidal/homicidal ideation: Yes Issues/concerns per patient self-inventory:  Yes Other:  New problem(s) identified: N/A  Discharge Plan or Barriers: Pt is homeless and doesn't want to return to [employer].  Pt to find new shelter and follow up will be scheduled based on that.    Reason for Continuation of Hospitalization: Anxiety Depression Medication Stabilization  Comments: N/A  Estimated length of stay: 3-4 days  For review of initial/current patient goals, please see plan of care.  Attendees: Patient:     Family:     Physician:   07/10/2014 10:51 AM   Nursing:   Angelena Form, RN 07/10/2014 10:51 AM   Clinical Social Worker:  Reyes Ivan, LCSW 07/10/2014 10:51 AM   Other:  Leighton Parody, RN 07/10/2014 10:51 AM   Other:  Trula Slade, LCSWA 07/10/2014 10:51 AM   Other:   07/10/2014 10:51 AM   Other:     Other:    Other:    Other:    Other:    Other:    Other:     Scribe for Treatment Team:   Carmina Miller, 07/10/2014 , 10:51 AM

## 2014-07-10 NOTE — [facility] Group Notes (Signed)
  [facility] LCSW Aftercare Discharge Planning Group Note  07/10/2014  10:07 AM  Participation Quality: Did Not Attend.  Samuella Bruin, MSW, [employer] Clinical Social Worker [facility] [phone]

## 2014-07-10 NOTE — Progress Notes (Signed)
Pt resting in bed with eyes closed. No distress noted. Will continue to monitor closely.  

## 2014-07-10 NOTE — Progress Notes (Signed)
[facility] MD Progress Note  07/10/2014 3:38 PM Ryan Long  MRN:  [id number]  Subjective:  I hear voices sometime.  I'm not comfortable around people.  I still have suicidal thoughts.    Objective :  Patient seen chart reviewed.  He is taking his medication however he continued to feel paranoid and endorsed suicidal thoughts.  He is not comfortable around people.  He admitted some time hallucinations .  He is sleeping on and off.  He appears irritable.  He was involved in a verbal altercation with another patient yesterday.  Patient is not aggressive or violent however he remains very paranoid and guarded.  Diagnosis:  Major depressive disorder, recurrent, severe  Depressive Disorders:  Major Depressive Disorder - Severe (296.23) Total Time spent with patient: 20 minutes  Axis I: Major Depression, Recurrent severe Axis II: Deferred Axis III:  Past Medical History  Diagnosis Date  . HIV (human immunodeficiency virus infection)   . Chronic systolic heart failure   . Hypertension   . Genital herpes   . Active smoker   . AR (allergic rhinitis)   . HLD (hyperlipidemia)   . NICM (nonischemic cardiomyopathy)     Echocardiogram 06/28/11: EF 30-35%, mild MR, mild LAE;  No CAD by coronary CT angiogram 3/12 at [facility]  . NSVT (nonsustained ventricular tachycardia)   . Depression   . Bipolar 1 disorder   . Alcohol abuse   . Crack cocaine use   . COPD (chronic obstructive pulmonary disease)   . CHF (congestive heart failure)   . Anxiety   . PTSD (post-traumatic stress disorder)    Axis IV: housing problems, other psychosocial or environmental problems, problems related to social environment and problems with primary support group Axis V: 41-50 serious symptoms  ADL's:  Intact  Sleep: Fair  Appetite:  Good  Suicidal Ideation:  Denies any suicidal ideations at this time Homicidal Ideation:  NA  Psychiatric Specialty Exam: Physical Exam  Review of Systems   Constitutional: Negative for fever and chills.  Respiratory: Negative for cough and shortness of breath.   Cardiovascular: Negative for chest pain and palpitations.  Skin: Negative for rash.  Neurological: Negative for headaches.  Psychiatric/Behavioral: Positive for depression. Negative for hallucinations.    Blood pressure 89/64, pulse 102, temperature 97.6 F (36.4 C), temperature source Oral, resp. rate 18, height 5\' 9"  (1.753 m), weight 200 lb 8 oz (90.946 kg), SpO2 99.00%.Body mass index is 29.6 kg/(m^2).  General Appearance: Fairly Groomed and Guarded  Patent attorney::  Fair  Speech:  Clear and Coherent  Volume:  Normal  Mood:  Remains depressed, but some improvement compared to admission  Affect: Still constricted, but somewhat more reactive- does smile at times appropriately  Thought Process:  Coherent, Goal Directed and Intact  Orientation:  Full (Time, Place, and Person)  Thought Content:  Hallucinations: Auditory Endorse some time people calling his name.  Suicidal Thoughts:  No- at this time denies any suicidal plan /intentions, and contracts for safety on the unit.  Homicidal Thoughts: No   Memory:  NA  Judgement:  Fair  Insight:  Fair  Psychomotor Activity:  Normal  Concentration:  Good  Recall:  NA  Fund of Knowledge:Good  Language: Good  Akathisia:  NA  Handed:  Right  AIMS (if indicated):     Assets:  Desire for Improvement  Sleep:  Number of Hours: 5.75   Musculoskeletal: Strength & Muscle Tone: within normal limits Gait & Station: normal Patient leans: N/A  Current Medications: Current Facility-Administered Medications  Medication Dose Route Frequency Provider Last Rate Last Dose  . acetaminophen (TYLENOL) tablet 650 mg  650 mg Oral Q6H PRN Court Joyharles E Kober, PA-C      . albuterol (PROVENTIL HFA;VENTOLIN HFA) 108 (90 BASE) MCG/ACT inhaler 2 puff  2 puff Inhalation PRN Court Joyharles E Kober, PA-C      . alum & mag hydroxide-simeth (MAALOX/MYLANTA) 200-200-20  MG/5ML suspension 30 mL  30 mL Oral Q4H PRN Court Joyharles E Kober, PA-C      . aspirin chewable tablet 81 mg  81 mg Oral Daily Court Joyharles E Kober, PA-C   81 mg at 07/10/14 0737  . buPROPion (WELLBUTRIN XL) 24 hr tablet 300 mg  300 mg Oral Daily Beau FannyJohn C Withrow, FNP   300 mg at 07/10/14 0737  . digoxin (LANOXIN) tablet 125 mcg  125 mcg Oral Daily Court JoyCharles E Kober, PA-C   125 mcg at 07/10/14 [id number]  . emtricitabine-tenofovir (TRUVADA) 200-300 MG per tablet 1 tablet  1 tablet Oral Daily Court Joyharles E Kober, PA-C   1 tablet at 07/10/14 [id number]  . furosemide (LASIX) tablet 40 mg  40 mg Oral Daily Court Joyharles E Kober, PA-C   40 mg at 07/10/14 [id number]  . hydrOXYzine (ATARAX/VISTARIL) tablet 25-50 mg  25-50 mg Oral TID PRN Court Joyharles E Kober, PA-C   25 mg at 07/10/14 1243  . isosorbide dinitrate (ISORDIL) tablet 5 mg  5 mg Oral TID Court Joyharles E Kober, PA-C   5 mg at 07/10/14 0736  . lisinopril (PRINIVIL,ZESTRIL) tablet 5 mg  5 mg Oral Daily Court Joyharles E Kober, PA-C   5 mg at 07/10/14 0737  . magnesium hydroxide (MILK OF MAGNESIA) suspension 30 mL  30 mL Oral Daily PRN Court Joyharles E Kober, PA-C   30 mL at 07/05/14 1254  . metoprolol succinate (TOPROL-XL) 24 hr tablet 25 mg  25 mg Oral Daily Court Joyharles E Kober, PA-C   25 mg at 07/10/14 0736  . nicotine (NICODERM CQ - dosed in mg/24 hours) patch 21 mg  21 mg Transdermal Daily Court Joyharles E Kober, PA-C   21 mg at 07/10/14 0737  . potassium chloride (K-DUR,KLOR-CON) CR tablet 10 mEq  10 mEq Oral Daily Court Joyharles E Kober, PA-C   10 mEq at 07/10/14 0740  . raltegravir (ISENTRESS) tablet 400 mg  400 mg Oral BID Court Joyharles E Kober, PA-C   400 mg at 07/10/14 [id number]  . sertraline (ZOLOFT) tablet 50 mg  50 mg Oral Daily Nehemiah MassedFernando Cobos, MD   50 mg at 07/10/14 0737  . spironolactone (ALDACTONE) tablet 12.5 mg  12.5 mg Oral Daily Court Joyharles E Kober, PA-C   12.5 mg at 07/10/14 0736  . traZODone (DESYREL) tablet 75 mg  75 mg Oral QHS Court Joyharles E Kober, PA-C   75 mg at 07/09/14 2154  . valACYclovir (VALTREX) tablet 500 mg  500 mg  Oral BID Court Joyharles E Kober, PA-C   500 mg at 07/10/14 [id number]    Lab Results: No results found for this or any previous visit (from the past 48 hour(s)).  Physical Findings: AIMS: Facial and Oral Movements Muscles of Facial Expression: None, normal Lips and Perioral Area: None, normal Jaw: None, normal Tongue: None, normal,Extremity Movements Upper (arms, wrists, hands, fingers): None, normal Lower (legs, knees, ankles, toes): None, normal, Trunk Movements Neck, shoulders, hips: None, normal, Overall Severity Severity of abnormal movements (highest score from questions above): None, normal Incapacitation due to abnormal movements: None, normal Patient's awareness of abnormal movements (rate only patient's report): No  Awareness, Dental Status Current problems with teeth and/or dentures?: No Does patient usually wear dentures?: No  CIWA:    COWS:     Assessment:  The patient remains very guarded and easily irritable.  He is taking his medication.  He does not have any side effects.    Plan:  I will add low-dose Risperdal to help paranoia and hallucination.   Continue with plan of care Continue crisis management  - Wellbutrin to 300mg  XR daily  - Continue Zoloft  To 50mg rs QDAY initially  - Continue disposition planning    Medical Decision Making Problem Points:  Established problem, stable/improving (1), Established problem, worsening (2) and Review of last therapy session (1) Data Points:  Review of medication regiment & side effects (2) Review of new medications or change in dosage (2)  I certify that inpatient services furnished can reasonably be expected to improve the patient's condition.   Ekam Bonebrake T., 07/10/2014, 3:38 PM

## 2014-07-11 MED ORDER — FUROSEMIDE 20 MG PO TABS
20.0000 mg | ORAL_TABLET | Freq: Every day | ORAL | Status: DC
  Administered 2014-07-12: 09:00:00 via ORAL
  Administered 2014-07-13 – 2014-07-14 (×2): 20 mg via ORAL
  Filled 2014-07-11 (×5): qty 1

## 2014-07-11 MED ORDER — METOPROLOL SUCCINATE 12.5 MG HALF TABLET
12.5000 mg | ORAL_TABLET | Freq: Every day | ORAL | Status: DC
  Administered 2014-07-12 – 2014-07-14 (×3): 12.5 mg via ORAL
  Filled 2014-07-11 (×5): qty 1

## 2014-07-11 NOTE — Progress Notes (Signed)
Patient did not attend AA meeting. He stayed in his room and slept. He joined his peers in the dayroom for snack, but his interactions were minimal. Dell Briner A 12:06 AM

## 2014-07-11 NOTE — [facility] Group Notes (Signed)
[facility] Group Notes:  (Nursing/MHT/Case Management/Adjunct)  Date:  07/11/2014  Time:  11:00 AM  Type of Therapy:  Psychoeducational Skills  Participation Level:  Did Not Attend  Greta Yung Shanta 07/11/2014, 11:00 AM 

## 2014-07-11 NOTE — [facility] Group Notes (Signed)
[facility] Group Notes: (Clinical Social Work)   07/11/2014      Type of Therapy:  Group Therapy   Participation Level:  Did Not Attend - arrived in last 5 minutes   Ambrose Mantle, LCSW 07/11/2014, 11:31 AM

## 2014-07-11 NOTE — Progress Notes (Signed)
Patient ID: Ryan Long, male   DOB: 1961/12/09, 53 y.o.   MRN: [id number]  Pt alert and oriented. Minimum interaction with peers and staff.  Pt irritable and angry about am medication change. Denies SI/HI, -A/[place]. Denies pain or discomfort. Refused to answer further questions when writer would not agree with him that medication should not have been changed. "I'm not going to answer anymore questions. I'm ready to get the fuck out of here, nothing against you!"  Monitored Q . Will continue to monitor and evaluate for stabilization.

## 2014-07-11 NOTE — [facility] Group Notes (Signed)
[facility] Group Notes:  (Nursing/MHT/Case Management/Adjunct)  Date:  07/11/2014  Time:  3:42 PM  Type of Therapy:  Psychoeducational Skills  Participation Level:  Active  Participation Quality:  Appropriate  Affect:  Appropriate  Cognitive:  Appropriate  Insight:  Appropriate  Engagement in Group:  Engaged  Modes of Intervention:  Discussion  Summary of Progress/Problems: Pt did attend healthy coping skills.     Pt reported concerns about feeling "doped up", pt advised that the doctor will be made aware.    Jacquelyne Balint Shanta 07/11/2014, 3:42 PM

## 2014-07-11 NOTE — Progress Notes (Signed)
Patient stated he is SI, but knows he cannot hurt himself while patient at [facility].  Feels he would be SI if discharged, to walk to front of truck, contracts for safety today at [facility].  Denied HI.  Denied A/V hallucinations.  Denied pain.

## 2014-07-11 NOTE — Progress Notes (Signed)
[facility]  07/11/2014 11:19 AM Ryan BabinskiBarry Long  MRN:  [id number]  Subjective:   "I feel better (negative/give up) thoughts are less often".  Denies SIHI     Denies AVH- "i don't actually her voices only hear my own thoughts"  Depression  5/10    Anxiety 4/10 Sleep- reports Hard to fall asleep but once asleep slept well     Appetite- good  I haven't worked since 2013 related CHF diagnosis.   I feel hopeful about possible housing once discharged.  Help in securing housing by  [facility]   [address] [place] # [phone]  Marcellina MillinRivkah Meder [facility](LCSW) # [phone]     Objective :  Patient seen chart reviewed.  He is taking his medications and reports he feels better today.  He is calm and cooperative   He continues to have fleeting negative thoughts. Contracts for safety  He tells me he is not comfortable around people.     Diagnosis:  Major depressive disorder, recurrent, severe  Depressive Disorders:  Major Depressive Disorder - Severe (296.23) Total Time spent with patient: 20 minutes  Axis I: Major Depression, Recurrent severe Axis II: Deferred Axis III:  Past Medical History  Diagnosis Date  . HIV (human immunodeficiency virus infection)   . Chronic systolic heart failure   . Hypertension   . Genital herpes   . Active smoker   . AR (allergic rhinitis)   . HLD (hyperlipidemia)   . NICM (nonischemic cardiomyopathy)     Echocardiogram 06/28/11: EF 30-35%, mild MR, mild LAE;  [facility]  . NSVT (nonsustained ventricular tachycardia)   . Depression   . Bipolar 1 disorder   . Alcohol abuse   . Crack cocaine use   . COPD (chronic obstructive pulmonary disease)   . CHF (congestive heart failure)   . Anxiety   . PTSD (post-traumatic stress disorder)    Axis IV: housing problems, other psychosocial or environmental problems, problems related to social environment and problems with primary support group Axis V: 41-50  serious symptoms  ADL's:  Intact  He is wearing hospital gowns, "i don't have other clothes"  Sleep: Fair  Appetite:  Good  Suicidal Ideation:  Denies any suicidal ideations at this time, but speaks to negative fleeting thoughts Homicidal Ideation:  NA  Psychiatric Specialty Exam: Physical Exam  Constitutional: He is oriented to person, place, and time. He appears well-developed and well-nourished.  HENT:  Head: Normocephalic and atraumatic.  Neck: Normal range of motion.  Neurological: He is alert and oriented to person, place, and time.  Skin: Skin is warm and dry.    Review of Systems  Constitutional: Negative for fever and chills.  Respiratory: Negative for cough and shortness of breath.   Cardiovascular: Negative for chest pain and palpitations.  Skin: Negative for rash.  Neurological: Negative for headaches.  Psychiatric/Behavioral: Positive for depression. Negative for hallucinations.    Blood pressure 95/73, pulse 91, temperature 97.5 F (36.4 C), temperature source Oral, resp. rate 18, height 5\' 9"  (1.753 m), weight 90.946 kg (200 lb 8 oz), SpO2 100.00%.Body mass index is 29.6 kg/(m^2).  General Appearance: casual, showered, wearing hospital gown  Eye Contact::  Fair  Speech:  Clear and Coherent  Volume:  Normal  Mood:  depressed,   Affect: calm and cooperative  Thought Process:  Coherent, Goal Directed and Intact  Orientation:  Full (Time, Place, and Person)  Thought Content:  Denies actual voices, but does have recurring thoughts  Suicidal Thoughts:  No- at this time denies any suicidal plan /intentions, and contracts for safety on the unit.  Homicidal Thoughts: No   Memory:  NA  Judgement:  Fair  Insight:  Fair  Psychomotor Activity:  Normal  Concentration:  Good  Recall:  NA  Fund of Knowledge:Good  Language: Good  Akathisia:  NA  Handed:  Right  AIMS (if indicated):     Assets:  Desire for Improvement  Sleep:  Number of Hours: 6    Musculoskeletal: Strength & Muscle Tone: within normal limits Gait & Station: normal Patient leans: N/A  Current Medications: Current Facility-Administered Medications  Medication Dose Route Frequency Provider Last Rate Last Dose  . acetaminophen (TYLENOL) tablet 650 mg  650 mg Oral Q6H PRN Court Joy, PA-C      . albuterol (PROVENTIL HFA;VENTOLIN HFA) 108 (90 BASE) MCG/ACT inhaler 2 puff  2 puff Inhalation PRN Court Joy, PA-C      . alum & mag hydroxide-simeth (MAALOX/MYLANTA) 200-200-20 MG/5ML suspension 30 mL  30 mL Oral Q4H PRN Court Joy, PA-C      . aspirin chewable tablet 81 mg  81 mg Oral Daily Court Joy, PA-C   81 mg at 07/11/14 0370  . buPROPion (WELLBUTRIN XL) 24 hr tablet 300 mg  300 mg Oral Daily Beau Fanny, FNP   300 mg at 07/11/14 4888  . digoxin (LANOXIN) tablet 125 mcg  125 mcg Oral Daily Court Joy, PA-C   125 mcg at 07/11/14 9169  . emtricitabine-tenofovir (TRUVADA) 200-300 MG per tablet 1 tablet  1 tablet Oral Daily Court Joy, PA-C   1 tablet at 07/11/14 0809  . furosemide (LASIX) tablet 40 mg  40 mg Oral Daily Court Joy, PA-C   40 mg at 07/11/14 0809  . hydrOXYzine (ATARAX/VISTARIL) tablet 25-50 mg  25-50 mg Oral TID PRN Court Joy, PA-C   25 mg at 07/11/14 4503  . isosorbide dinitrate (ISORDIL) tablet 5 mg  5 mg Oral TID Court Joy, PA-C   5 mg at 07/11/14 0810  . lisinopril (PRINIVIL,ZESTRIL) tablet 5 mg  5 mg Oral Daily Court Joy, PA-C   5 mg at 07/11/14 8882  . magnesium hydroxide (MILK OF MAGNESIA) suspension 30 mL  30 mL Oral Daily PRN Court Joy, PA-C   30 mL at 07/11/14 0818  . metoprolol succinate (TOPROL-XL) 24 hr tablet 25 mg  25 mg Oral Daily Court Joy, PA-C   25 mg at 07/11/14 8003  . nicotine (NICODERM CQ - dosed in mg/24 hours) patch 21 mg  21 mg Transdermal Daily Court Joy, PA-C   21 mg at 07/11/14 0805  . potassium chloride (K-DUR,KLOR-CON) CR tablet 10 mEq  10 mEq Oral Daily  Court Joy, PA-C   10 mEq at 07/11/14 4917  . raltegravir (ISENTRESS) tablet 400 mg  400 mg Oral BID Court Joy, PA-C   400 mg at 07/11/14 0813  . sertraline (ZOLOFT) tablet 50 mg  50 mg Oral Daily Nehemiah Massed, MD   50 mg at 07/11/14 0813  . spironolactone (ALDACTONE) tablet 12.5 mg  12.5 mg Oral Daily Court Joy, PA-C   12.5 mg at 07/11/14 9150  . traZODone (DESYREL) tablet 75 mg  75 mg Oral QHS Court Joy, PA-C   75 mg at 07/10/14 2133  . valACYclovir (VALTREX) tablet 500 mg  500  mg Oral BID Court Joy, PA-C   500 mg at 07/11/14 1610    Lab Results: No results found for this or any previous visit (from the past 48 hour(s)).  Physical Findings: AIMS: Facial and Oral Movements Muscles of Facial Expression: None, normal Lips and Perioral Area: None, normal Jaw: None, normal Tongue: None, normal,Extremity Movements Upper (arms, wrists, hands, fingers): None, normal Lower (legs, knees, ankles, toes): None, normal, Trunk Movements Neck, shoulders, hips: None, normal, Overall Severity Severity of abnormal movements (highest score from questions above): None, normal Incapacitation due to abnormal movements: None, normal Patient's awareness of abnormal movements (rate only patient's report): No Awareness, Dental Status Current problems with teeth and/or dentures?: No Does patient usually wear dentures?: No  CIWA:    COWS:     Assessment:   Patient is calm and cooperative  He is taking his medication.  He does not report any side effects.  He expresses hope for secrued hosuing on discharge and believes "this will help"  Plan:  Consider augmenting with  mood stabilizes/anti psychotic--will discuss with Dr Fredda Hammed in morning Continue with plan of care Continue crisis management  - Wellbutrin to 300mg  XR daily  - Continue Zoloft  50mg s QDAY - Continue disposition planning    - he tells me he is likely to dc back to [employer] [facility] in [facility]  Decision Making Problem Points:  Established problem, stable/improving (1), Established problem, worsening (2) and Review of last therapy session (1) Data Points:  Review of medication regiment & side effects (2) Review of new medications or change in dosage (2)  I certify that inpatient services furnished can reasonably be expected to improve the patient's condition.   Lorinda Creed, PMHNP 07/11/2014, 11:19 AM I agreed with findings and treatment plan of this patient

## 2014-07-11 NOTE — Progress Notes (Signed)
D. Pt.'s B/P has been running low and some of Pt.'s BP were decreased due to decreased B/P.  Pt. Angry because BP Medications were changed.  Pt. Requested and signed a  72 hour request for discharge. A.  Passenger transport manager tried to talk with pt. About BP medications but pt remained angry.  Encouraged pt. To talk  With physician in the am concerning medications. R.  Pt. Reported that he would talk with physcian in AM.  72 Hour Request signed at 1900.

## 2014-07-11 NOTE — Progress Notes (Signed)
[facility] Group Notes:  (Nursing/MHT/Case Management/Adjunct)  Date:  07/11/2014  Time:  6:56 PM  Type of Therapy:  Psychoeducational Skills  Participation Level:  None  Participation Quality:  None  Affect:  Flat  Cognitive:  Appropriate  Insight:  None  Engagement in Group:  None  Modes of Intervention:  Activity  Summary of Progress/Problems: Pts played an activity of Pictionary using coping skills. Pt sat in group, but did not participate and did not want to play when called upon.  Caswell Corwin 07/11/2014, 6:56 PM

## 2014-07-12 MED ORDER — BUPROPION HCL ER (XL) 150 MG PO TB24
150.0000 mg | ORAL_TABLET | Freq: Every day | ORAL | Status: DC
  Filled 2014-07-12 (×2): qty 1
  Filled 2014-07-12: qty 4
  Filled 2014-07-12 (×2): qty 1

## 2014-07-12 MED ORDER — RISPERIDONE 1 MG PO TABS
1.0000 mg | ORAL_TABLET | Freq: Every day | ORAL | Status: DC
  Filled 2014-07-12 (×3): qty 1

## 2014-07-12 MED ORDER — RISPERIDONE 1 MG PO TABS
1.0000 mg | ORAL_TABLET | ORAL | Status: AC
  Filled 2014-07-12: qty 1

## 2014-07-12 MED ORDER — SPIRONOLACTONE 12.5 MG HALF TABLET
12.5000 mg | ORAL_TABLET | Freq: Every day | ORAL | Status: DC
  Administered 2014-07-12 – 2014-07-14 (×3): 12.5 mg via ORAL
  Filled 2014-07-12 (×6): qty 1

## 2014-07-12 NOTE — [facility] Group Notes (Signed)
[facility] Group Notes:  (Nursing/MHT/Case Management/Adjunct)  Date:  07/12/2014  Time:  6:58 PM  Type of Therapy:  Psychoeducational Skills  Participation Level:  Did Not Attend  Ryan Long 07/12/2014, 6:58 PM

## 2014-07-12 NOTE — Progress Notes (Addendum)
Patient ID: Ryan Long, male   DOB: August 23, 1961, 53 y.o.   MRN: [id number] D: Patient up in the milieu. He presents with sullen affect, depressed mood.  He is currently filling out his self inventory sheet, which he refused earlier.  Patient has been resistant to treatment, refusing his medications.  Patient was due to receive risperdal and stated, "I'm not taking anything until I talk to my cardiologist."  "I will take my heart medication."  Patient is passive when asked about SI/HI/AVH.  Patient is eager to be discharged.  A: Continue to assess patient.  Offer encouragement and support as needed.  Safety checks every 15 minutes completed per protocol.  R: Patient has minimal interaction with staff.  Update 1114: patient filled out self inventory.  He is sleeping fair; appetite fair; concentration poor.  He rates his depression and hopelessness as a 4; anxiety 4.  Patient denies any withdrawal symptoms today.  Patient wrote, "If I return to [facility], I know my thoughts will come back to jump out in front of a truck.  He denies any SI/HI/AVH currently.  He wrote, "the psyche meds I was taking was giving me a doped up feeling.  I would notfeel safe returning to [facility] not after what happened to me there.  I would like my social worker to help me get into [employer]."  Patient is cooperative and is interacting appropriately with staff and his peers.

## 2014-07-12 NOTE — [facility] Group Notes (Signed)
[facility] LCSW Group Therapy 07/12/2014   Type of Therapy: Group Therapy- Feelings Around Discharge & Establishing a Supportive Framework  Did not attend.  Lashena Signer Carter, LCSWA 07/12/2014 12:12 PM     

## 2014-07-12 NOTE — Progress Notes (Signed)
Eddy refused zoloft and wellbutrin this am.  Reported to provider.

## 2014-07-12 NOTE — Progress Notes (Signed)
Patient did attend the evening speaker AA meeting.  

## 2014-07-12 NOTE — Progress Notes (Signed)
[facility] MD Progress Note  07/12/2014 10:01 AM Ryan Long  MRN:  [id number]  Subjective:   -patient verbalizes frustration with medication changes implemented to address his low BP yesterday -we discussed rational and he feels strongly that his cardiologist will make those changes. I will call a consult to the HF Team [phone]) will add his Aldactone back on and leave the reduced doses of diuretics.  Patient is in agreement, HF team can make recommendations -patietn also feels that his Wellbutrin worked better at a lower dose, will decrease to 150 mg XR (discussed with Dr Fredda Hammed)  -Per EMR patient was seen by Dr Jones Broom at [facility] in 2012, patient tells me he is seeing cardiologist in [place].   Today patient rates depression  5/10 and  anxiety 4/10  Denies SIHI  Denies AVH- "I don't actually her voices only hear my own thoughts".  He does tell me he feels paranoid at times and untrusting.  We discussed the addition of Risperdal as an augment and he agrees.     Objective :  Patient seen chart reviewed.  He is taking his medications and reports he feels better today.  He is calm and cooperative   He continues to have fleeting negative thoughts. Contracts for safety  He tells me he is not comfortable around people.   Has not attended groups  Diagnosis:  Major depressive disorder, recurrent, severe  Depressive Disorders:  Major Depressive Disorder - Severe (296.23) Total Time spent with patient: 20 minutes  Axis I: Major Depression, Recurrent severe Axis II: Deferred Axis III:  Past Medical History  Diagnosis Date  . HIV (human immunodeficiency virus infection)   . Chronic systolic heart failure   . Hypertension   . Genital herpes   . Active smoker   . AR (allergic rhinitis)   . HLD (hyperlipidemia)   . NICM (nonischemic cardiomyopathy)     Echocardiogram 06/28/11: EF 30-35%, mild MR, mild LAE;  No CAD by coronary CT angiogram 3/12 at [facility]  . NSVT (nonsustained  ventricular tachycardia)   . Depression   . Bipolar 1 disorder   . Alcohol abuse   . Crack cocaine use   . COPD (chronic obstructive pulmonary disease)   . CHF (congestive heart failure)   . Anxiety   . PTSD (post-traumatic stress disorder)    Axis IV: housing problems, other psychosocial or environmental problems, problems related to social environment and problems with primary support group Axis V: 41-50 serious symptoms  ADL's:  Intact  He is wearing hospital gowns,  Sleep: Fair  Appetite:  Good  Suicidal Ideation:  Denies any suicidal ideations at this time, but speaks to negative fleeting thoughts Homicidal Ideation:  NA  Psychiatric Specialty Exam: Physical Exam  Constitutional: He is oriented to person, place, and time. He appears well-developed and well-nourished.  HENT:  Head: Normocephalic and atraumatic.  Neck: Normal range of motion.  Neurological: He is alert and oriented to person, place, and time.  Skin: Skin is warm and dry.    Review of Systems  Constitutional: Negative for fever and chills.  Respiratory: Negative for cough and shortness of breath.   Cardiovascular: Negative for chest pain and palpitations.  Skin: Negative for rash.  Neurological: Negative for headaches.  Psychiatric/Behavioral: Positive for depression. Negative for hallucinations.    Blood pressure 97/75, pulse 95, temperature 97.7 F (36.5 C), temperature source Oral, resp. rate 16, height 5\' 9"  (1.753 m), weight 90.946 kg (200 lb 8 oz), SpO2 96.00%.Body  mass index is 29.6 kg/(m^2).  General Appearance: casual, showered, wearing hospital gown  Eye Contact::  Fair  Speech:  Clear and Coherent  Volume:  Normal  Mood:  depressed,   Affect: calm and cooperative  Thought Process:  paranoid  Orientation:  Full (Time, Place, and Person)  Thought Content:  Denies actual voices, but does have recurring thoughts  Suicidal Thoughts:  No- at this time denies any suicidal plan /intentions, and  contracts for safety on the unit.  Homicidal Thoughts: No   Memory:  NA  Judgement:  Fair  Insight:  Fair  Psychomotor Activity:  Normal  Concentration:  Good  Recall:  NA  Fund of Knowledge:Good  Language: Good  Akathisia:  NA  Handed:  Right  AIMS (if indicated):     Assets:  Desire for Improvement  Sleep:  Number of Hours: 6.25   Musculoskeletal: Strength & Muscle Tone: within normal limits Gait & Station: normal Patient leans: N/A  Current Medications: Current Facility-Administered Medications  Medication Dose Route Frequency Provider Last Rate Last Dose  . acetaminophen (TYLENOL) tablet 650 mg  650 mg Oral Q6H PRN Court Joy, PA-C      . albuterol (PROVENTIL HFA;VENTOLIN HFA) 108 (90 BASE) MCG/ACT inhaler 2 puff  2 puff Inhalation PRN Court Joy, PA-C      . alum & mag hydroxide-simeth (MAALOX/MYLANTA) 200-200-20 MG/5ML suspension 30 mL  30 mL Oral Q4H PRN Court Joy, PA-C      . aspirin chewable tablet 81 mg  81 mg Oral Daily Court Joy, PA-C   81 mg at 07/12/14 0900  . buPROPion (WELLBUTRIN XL) 24 hr tablet 300 mg  300 mg Oral Daily Beau Fanny, FNP   300 mg at 07/11/14 2122  . digoxin (LANOXIN) tablet 125 mcg  125 mcg Oral Daily Court Joy, PA-C   125 mcg at 07/12/14 0900  . emtricitabine-tenofovir (TRUVADA) 200-300 MG per tablet 1 tablet  1 tablet Oral Daily Court Joy, PA-C   1 tablet at 07/12/14 4825  . furosemide (LASIX) tablet 20 mg  20 mg Oral Daily Canary Brim, NP      . hydrOXYzine (ATARAX/VISTARIL) tablet 25-50 mg  25-50 mg Oral TID PRN Court Joy, PA-C   25 mg at 07/11/14 1736  . isosorbide dinitrate (ISORDIL) tablet 5 mg  5 mg Oral TID Court Joy, PA-C   5 mg at 07/12/14 0900  . lisinopril (PRINIVIL,ZESTRIL) tablet 5 mg  5 mg Oral Daily Court Joy, PA-C   5 mg at 07/12/14 0900  . magnesium hydroxide (MILK OF MAGNESIA) suspension 30 mL  30 mL Oral Daily PRN Court Joy, PA-C   30 mL at 07/11/14 0818  .  metoprolol succinate (TOPROL-XL) 24 hr tablet 12.5 mg  12.5 mg Oral Daily Canary Brim, NP   12.5 mg at 07/12/14 0900  . nicotine (NICODERM CQ - dosed in mg/24 hours) patch 21 mg  21 mg Transdermal Daily Court Joy, PA-C   21 mg at 07/12/14 0901  . potassium chloride (K-DUR,KLOR-CON) CR tablet 10 mEq  10 mEq Oral Daily Court Joy, PA-C   10 mEq at 07/12/14 0037  . raltegravir (ISENTRESS) tablet 400 mg  400 mg Oral BID Court Joy, PA-C   400 mg at 07/12/14 0900  . sertraline (ZOLOFT) tablet 50 mg  50 mg Oral Daily Nehemiah Massed, MD   50 mg at 07/11/14 0813  . traZODone (  DESYREL) tablet 75 mg  75 mg Oral QHS Court Joyharles E Kober, PA-C   75 mg at 07/11/14 2136  . valACYclovir (VALTREX) tablet 500 mg  500 mg Oral BID Court Joyharles E Kober, PA-C   500 mg at 07/12/14 [id number]    Lab Results: No results found for this or any previous visit (from the past 48 hour(s)).  Physical Findings: AIMS: Facial and Oral Movements Muscles of Facial Expression: None, normal Lips and Perioral Area: None, normal Jaw: None, normal Tongue: None, normal,Extremity Movements Upper (arms, wrists, hands, fingers): None, normal Lower (legs, knees, ankles, toes): None, normal, Trunk Movements Neck, shoulders, hips: None, normal, Overall Severity Severity of abnormal movements (highest score from questions above): None, normal Incapacitation due to abnormal movements: None, normal Patient's awareness of abnormal movements (rate only patient's report): No Awareness, Dental Status Current problems with teeth and/or dentures?: No Does patient usually wear dentures?: No  CIWA:    COWS:     Assessment:   Patient is calm and cooperative  He is taking his medication.  He does not report any side effects.  He expresses hope for secrued housing on discharge and believes "this will help"  Plan:  Continue with plan of care Continue crisis management  -decrease  Wellbutrin to 150mg  XR daily  - Continue Zoloft  50mg s  QDAY -add Risperdal 1 mg po qhs (discussed with Dr Fredda HammedAktar) - Continue disposition planning    - he tells me he is likely to [facility] back to [facility] [place] -continue cardiac drugs as written, may be seen by HF team here before discharged otherwise patient encouraged to F/U with his cardiologist once diescahrged  Medical Decision Making Problem Points:  Established problem, stable/improving (1), Established problem, worsening (2) and Review of last therapy session (1) Data Points:  Review of medication regiment & side effects (2) Review of new medications or change in dosage (2)  I certify that inpatient services furnished can reasonably be expected to improve the patient's condition.   Lorinda CreedLARACH, MARY, PMHNP 07/12/2014, 10:01 AM I agreed with findings and treatment plan of this patient

## 2014-07-12 NOTE — [facility] Group Notes (Signed)
[facility] Group Notes:  (Nursing/MHT/Case Management/Adjunct)  Date:  07/12/2014  Time:  6:39 PM  Type of Therapy:  Psychoeducational Skills  Participation Level:  Did Not Attend  Buford Dresser 07/12/2014, 6:39 PM

## 2014-07-13 NOTE — [facility] Group Notes (Signed)
[facility] LCSW Aftercare Discharge Planning Group Note  07/13/2014  9:22 AM  Participation Quality: Did Not Attend  Samuella Bruin, MSW, [facility] Clinical Social Worker [facility] [phone]

## 2014-07-13 NOTE — Progress Notes (Addendum)
D) Pt. Affect blunted.  Mood appears sad and depressed.  Pt. Reports that he has been tired. Pt. Refused his Wellbutrin, and Zoloft this am stating he wanted to "talk to the doctor first".  Pt. Has attended groups and been noted interacting with his roommate.  No noted issues with BP today. Pt. verbalized a desire to see his cardiologist as when available. Pt. Appeared somewhat concerned about his cardiac status.   Pt. Offered no other c/o.  A) Pt. Encouraged to take all medications per order.  Importance of all medications reviewed with pt. Offered comfort measures.  Fluids and adequate nutrition encouraged.  Pt. Checked on by RN  frequently throughout the day.  R) Pt. Receptive and remains safe on q 15 min. Observations.

## 2014-07-13 NOTE — [facility] Group Notes (Signed)
[facility] LCSW Aftercare Discharge Planning Group Note   07/13/2014 1:03 PM  Participation Quality:  DID NOT ATTEND-pt entered group room but was called out by RN and did not return. CSW to meet with pt individually this afternoon to discuss d/c planning.   [employer]

## 2014-07-13 NOTE — [facility] Group Notes (Signed)
[facility] LCSW Group Therapy 06/29/2014 1:15 PM Type of Therapy: Group Therapy Participation Level: Active  Participation Quality: Attentive, Sharing and Supportive  Affect: Depressed and Flat  Cognitive: Alert and Oriented  Insight: Developing/Improving and Engaged  Engagement in Therapy: Developing/Improving and Engaged  Modes of Intervention: Clarification, Confrontation, Discussion, Education, Exploration,  Limit-setting, Orientation, Problem-solving, Rapport Building, Dance movement psychotherapist, Socialization and Support  Summary of Progress/Problems: Pt identified obstacles faced currently and processed barriers involved in overcoming these obstacles. Pt identified steps necessary for overcoming these obstacles and explored motivation (internal and external) for facing these difficulties head on. Pt further identified one area of concern in their lives and chose a goal to focus on for today. When prompted to participate, patient stated that he did not wish to speak.   Samuella Bruin, MSW, [employer] Clinical Social Worker [facility] [phone]

## 2014-07-13 NOTE — [facility] Group Notes (Signed)
Adult Psychoeducational Group Note  Date:  07/13/2014 Time:  11:33 PM  Group Topic/Focus:  Wrap-Up Group:   The focus of this group is to help patients review their daily goal of treatment and discuss progress on daily workbooks.  Participation Level:  None  Participation Quality:  None  Affect:  Flat  Cognitive:  Appropriate  Insight: None  Engagement in Group:  None  Modes of Intervention:  Discussion and Education  Additional Comments:  Pt came to group late and didn't participate during open discussion.   Delia Chimes 07/13/2014, 11:33 PM

## 2014-07-13 NOTE — Progress Notes (Signed)
Adult Psychoeducational Group Note  Date:  07/13/2014 Time:  10:00 am  Group Topic/Focus:  Wellness Toolbox:   The focus of this group is to discuss various aspects of wellness, balancing those aspects and exploring ways to increase the ability to experience wellness.  Patients will create a wellness toolbox for use upon discharge.  Participation Level:  Active  Participation Quality:  Appropriate  Affect:  Appropriate  Cognitive:  Appropriate  Insight: None  Engagement in Group:  None  Modes of Intervention:  Discussion, Education, Socialization and Support  Additional Comments:  Pt declined to share any info with the group. Pt stayed for the entire group.   Beauden Tremont 07/13/2014, 2:55 PM

## 2014-07-13 NOTE — Progress Notes (Signed)
D Pt. Denies SI and HI, no complaints of pain or discomfort .  Pt. Continues to make brief eye contact when assessing.  A Writer offered support and encouragement.    R Pt. Refused his risperdal reporting he wanted to discuss it with his MD tomorrow.  Pt. Is slow to respond when ask a questions as though he has difficulty understanding at times.  Pt. Denies SI but will say not right now.  Pt. Remains safe on the unit.
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
## 2014-07-14 DIAGNOSIS — F321 Major depressive disorder, single episode, moderate: Secondary | ICD-10-CM

## 2014-07-14 DIAGNOSIS — F1020 Alcohol dependence, uncomplicated: Secondary | ICD-10-CM

## 2014-07-14 DIAGNOSIS — F1420 Cocaine dependence, uncomplicated: Secondary | ICD-10-CM

## 2014-07-14 MED ORDER — SERTRALINE HCL 50 MG PO TABS: 50.0000 mg | ORAL_TABLET | Freq: Every day | ORAL | Status: DC

## 2014-07-14 MED ORDER — ALBUTEROL SULFATE HFA 108 (90 BASE) MCG/ACT IN AERS: 2.0000 | INHALATION_SPRAY | RESPIRATORY_TRACT | Status: DC | PRN

## 2014-07-14 MED ORDER — CETIRIZINE HCL 10 MG PO TABS: 10.0000 mg | ORAL_TABLET | Freq: Every day | ORAL | Status: DC

## 2014-07-14 MED ORDER — EMTRICITABINE-TENOFOVIR DF 200-300 MG PO TABS: 1.0000 | ORAL_TABLET | Freq: Every day | ORAL | Status: DC

## 2014-07-14 MED ORDER — TRAZODONE HCL 150 MG PO TABS: 150.0000 mg | ORAL_TABLET | Freq: Every day | ORAL | Status: DC

## 2014-07-14 MED ORDER — METOPROLOL SUCCINATE ER 25 MG PO TB24
25.0000 mg | ORAL_TABLET | Freq: Every day | ORAL | Status: DC
Start: 2014-07-14 — End: 2014-08-31

## 2014-07-14 MED ORDER — MULTI-VITAMINS PO TABS: 1.0000 | ORAL_TABLET | Freq: Every day | ORAL | Status: DC

## 2014-07-14 MED ORDER — ASPIRIN 81 MG PO CHEW: 81.0000 mg | CHEWABLE_TABLET | Freq: Every day | ORAL | Status: DC

## 2014-07-14 MED ORDER — TRAZODONE HCL 150 MG PO TABS
ORAL_TABLET | ORAL | Status: DC
Start: 2014-07-14 — End: 2014-08-28

## 2014-07-14 MED ORDER — POTASSIUM CHLORIDE CRYS ER 10 MEQ PO TBCR: 10.0000 meq | EXTENDED_RELEASE_TABLET | Freq: Every day | ORAL | Status: DC

## 2014-07-14 MED ORDER — NICOTINE 21 MG/24HR TD PT24: 21.0000 mg | MEDICATED_PATCH | Freq: Every day | TRANSDERMAL | Status: DC

## 2014-07-14 MED ORDER — LISINOPRIL 5 MG PO TABS: 5.0000 mg | ORAL_TABLET | Freq: Every day | ORAL | Status: DC

## 2014-07-14 MED ORDER — HYDROXYZINE HCL 25 MG PO TABS: 25.0000 mg | ORAL_TABLET | Freq: Three times a day (TID) | ORAL | Status: DC | PRN

## 2014-07-14 MED ORDER — FUROSEMIDE 40 MG PO TABS: 40.0000 mg | ORAL_TABLET | Freq: Every day | ORAL | Status: DC

## 2014-07-14 MED ORDER — RALTEGRAVIR POTASSIUM 400 MG PO TABS: 400.0000 mg | ORAL_TABLET | Freq: Two times a day (BID) | ORAL | Status: DC

## 2014-07-14 MED ORDER — SPIRONOLACTONE 25 MG PO TABS: 12.5000 mg | ORAL_TABLET | Freq: Every day | ORAL | Status: DC

## 2014-07-14 MED ORDER — ISOSORBIDE DINITRATE 5 MG PO TABS: 5.0000 mg | ORAL_TABLET | Freq: Three times a day (TID) | ORAL | Status: DC

## 2014-07-14 MED ORDER — VALACYCLOVIR HCL 500 MG PO TABS: 500.0000 mg | ORAL_TABLET | Freq: Two times a day (BID) | ORAL | Status: DC

## 2014-07-14 MED ORDER — BUPROPION HCL ER (XL) 150 MG PO TB24: 150.0000 mg | ORAL_TABLET | Freq: Every day | ORAL | Status: DC

## 2014-07-14 MED ORDER — TRAZODONE HCL 150 MG PO TABS
75.0000 mg | ORAL_TABLET | Freq: Every day | ORAL | Status: DC
  Filled 2014-07-14 (×2): qty 0.5

## 2014-07-14 MED ORDER — DIGOXIN 125 MCG PO TABS: 125.0000 ug | ORAL_TABLET | Freq: Every day | ORAL | Status: DC
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
## 2014-07-14 NOTE — Progress Notes (Signed)
Recreation Therapy Notes  Animal-Assisted Activity/Therapy (AAA/T) Program Checklist/Progress Notes Patient Eligibility Criteria Checklist & Daily Group note for Rec Tx Intervention  Date: 07.28.2015 Time: 3:15pm Location: [address]   AAA/T Program Assumption of Risk Form signed by Patient/ or Parent Legal Guardian yes  Patient is free of allergies or sever asthma yes  Patient reports no fear of animals yes  Patient reports no history of cruelty to animals yes   Patient understands his/her participation is voluntary yes  Patient washes hands before animal contact yes  Patient washes hands after animal contact yes  Behavioral Response: Appropriate   Education: Hand Washing, Appropriate Animal Interaction   Education Outcome: Acknowledges understanding   Clinical Observations/Feedback: Patient interacted appropriately with therapeutic dog team and peers in session. Patient asked to leave session at approximately 3:30pm by RN to prepare for d/c.   Marykay Lex Donzell Coller, LRT/CTRS  Jearl Klinefelter 07/14/2014 4:31 PM

## 2014-07-14 NOTE — Progress Notes (Signed)
D Pt. Denies SI and HI,  No complaints of pain or discomfort noted.  A Writer offered support and encouragement, discussed coping skills with pt.  R Pt. Remains safe on the unit,  Pt. Reports a good day with depression down to a 2 now.  Pt. States he would like to speak with his case manager about discharging to [facility].  Pt. Would also like to discuss his medications with his MD but has agreed to take the wellbutrin and zoloft tomorrow since the risperdal has been discontinued.

## 2014-07-14 NOTE — Tx Team (Addendum)
Interdisciplinary Treatment Plan Update (Adult)  Date: 07/14/2014  Time Reviewed:  9:45 AM  Progress in Treatment: Attending groups: Yes Participating in groups:  Yes Taking medication as prescribed:  Yes Tolerating medication:  Yes Family/Significant othe contact made: No, pt refused Patient understands diagnosis:  Yes Discussing patient identified problems/goals with staff:  Yes Medical problems stabilized or resolved:  Yes Denies suicidal/homicidal ideation: Yes Issues/concerns per patient self-inventory:  Yes Other:  New problem(s) identified: N/A  Discharge Plan or Barriers: Pt is homeless and will temporarily return to the shelter in [place]. Pt will follow-up at [facility] in [place].  Reason for Continuation of Hospitalization: Anxiety Depression Medication Stabilization  Comments: N/A  Estimated length of stay: 3-4 days  For review of initial/current patient goals, please see plan of care.  Attendees: Patient:     Family:     Physician:  Aggie  07/14/2014 11:18 AM   Nursing:    07/14/2014 11:18 AM   Clinical Social Worker:   07/14/2014 11:18 AM   Other:   07/14/2014 11:18 AM   Other:  Herbert Seta Smart, LCSWA 07/14/2014 11:18 AM   Other:   07/14/2014 11:18 AM   Other:     Other:    Other:    Other:    Other:    Other:    Other:     Scribe for Treatment Team:   Chad Cordial, LCSWA 07/14/2014 11:18 AM

## 2014-07-14 NOTE — Progress Notes (Signed)
The focus of this group is to educate the patient on the purpose and policies of crisis stabilization and provide a format to answer questions about their admission.  The group details unit policies and expectations of patients while admitted.  

## 2014-07-14 NOTE — Progress Notes (Signed)
[facility] Adult Case Management Discharge Plan :  Will you be returning to the same living situation after discharge: Yes,  Pt will temporarily return to [facility] in [place] At discharge, do you have transportation home?:Yes,  Pt provided with bus passes and [employer] fare. Do you have the ability to pay for your medications:Yes,  Pt provided with 14-day script and 30-day prescription  Release of information consent forms completed and in the chart;  Patient's signature needed at discharge.  Patient to Follow up at: Follow-up Information   Follow up with [facility]. (Walk in between 8am-9am Monday through Friday for hospital follow-up/medication management/assessment for therapy services. )    Contact information:   [address], [place] [address] Phone: [phone] Fax: [phone]      Follow up with [facility]. (this is a walk-in appointment.  Please follow-up at this location to be set up with medication management and therapy.)    Contact information:   [facility] [address] [place],  [place], [place] [address] [phone]      Patient denies SI/HI:   Yes,   Pt denies    Safety Planning and Suicide Prevention discussed:  Pt refused collateral contact.  Safety planning reviewed with Pt.  Elaina Hoops 07/14/2014, 3:05 PM

## 2014-07-14 NOTE — [facility] Group Notes (Signed)
[facility] Mental Health Association Group Therapy 07/14/2014 1:15pm  Type of Therapy: Mental Health Association Presentation  Participation Level: Active  Participation Quality: Attentive  Affect: Appropriate  Cognitive: Oriented  Insight: Developing/Improving  Engagement in Therapy: Engaged  Modes of Intervention: Discussion, Education and Socialization  Summary of Progress/Problems: Mental Health Association (MHA) Speaker came to talk about his personal journey with substance abuse and addiction. The pt processed ways by which to relate to the speaker. MHA speaker provided handouts and educational information pertaining to groups and services offered by the [facility]. Pt was engaged in speaker's story as Pt was taking notes and asking for clarification when needed.  Pt took information about MHA and courses/resources provided there.   Chad Cordial, LCSWA 07/14/2014 2:49 PM

## 2014-07-14 NOTE — Progress Notes (Signed)
Discharge Note: Patient reviewed AVS with Alfonso Ellis RN and was walked to obtained belongings by Dellia Cloud RN. All belongings returned, patient forward thinking and mood happy with animated affect.

## 2014-07-14 NOTE — Progress Notes (Signed)
(  D) Patient reports that sleep was fair with sleep aid, appetite good, energy level low, concentration poor, denies depression and hopelessness, admits to anxiety level 1/10 (1 best). Patient denies withdrawal symptoms, and voices no physical complaints. Patient receptive to discussion on building and maintaining healthy relationship and on identifying supportive people in his life. Patient states that he has an older male friend that lives in [place] that has been an important person in his life for over twenty years. Patient talked about being guarded with others and not trusting most people. Patient wants to be discharged to [facility]. Patient knowledgeable about his medications and able to tell writer how he organizes his medications when not in the hospital. Patient refused his Wellbutrin and Zoloft this AZm stating that he wants to speak with a provider before he takes these medications. Patient denies SI/HI or psychosis.
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
## 2014-07-17 NOTE — Progress Notes (Signed)
Patient Discharge Instructions:  After Visit Summary (AVS):   Faxed to:  07/17/14 Discharge Summary Note:   Faxed to:  07/17/14 Psychiatric Admission Assessment Note:   Faxed to:  07/17/14 Suicide Risk Assessment - Discharge Assessment:   Faxed to:  07/17/14 Faxed/Sent to the Next Level Care provider:  07/17/14 Faxed to [facility] @ [phone] Faxed to [facility] @ [phone]  Jerelene Redden, 07/17/2014, 3:11 PM

## 2014-08-28 ENCOUNTER — Encounter (HOSPITAL_COMMUNITY): Payer: Self-pay | Admitting: Emergency Medicine

## 2014-08-28 ENCOUNTER — Observation Stay (HOSPITAL_COMMUNITY)
Admission: AD | Admit: 2014-08-28 | Discharge: 2014-08-31 | Disposition: A | Discharge status: Home / Self Care | Payer: Medicaid Other | Source: Intra-hospital | Attending: Psychiatry | Admitting: Psychiatry

## 2014-08-28 ENCOUNTER — Encounter (HOSPITAL_COMMUNITY): Payer: Self-pay | Admitting: *Deleted

## 2014-08-28 ENCOUNTER — Emergency Department (HOSPITAL_COMMUNITY)
Admission: EM | Admit: 2014-08-28 | Discharge: 2014-08-28 | Disposition: A | Discharge status: Other Acute Inpatient Hospital | Payer: 59 | Attending: Emergency Medicine | Admitting: Emergency Medicine

## 2014-08-28 DIAGNOSIS — I10 Essential (primary) hypertension: Secondary | ICD-10-CM | POA: Insufficient documentation

## 2014-08-28 DIAGNOSIS — F17200 Nicotine dependence, unspecified, uncomplicated: Secondary | ICD-10-CM | POA: Diagnosis not present

## 2014-08-28 DIAGNOSIS — J449 Chronic obstructive pulmonary disease, unspecified: Secondary | ICD-10-CM | POA: Diagnosis not present

## 2014-08-28 DIAGNOSIS — Z21 Asymptomatic human immunodeficiency virus [HIV] infection status: Secondary | ICD-10-CM | POA: Diagnosis not present

## 2014-08-28 DIAGNOSIS — F1410 Cocaine abuse, uncomplicated: Secondary | ICD-10-CM | POA: Diagnosis not present

## 2014-08-28 DIAGNOSIS — F1020 Alcohol dependence, uncomplicated: Secondary | ICD-10-CM | POA: Diagnosis not present

## 2014-08-28 DIAGNOSIS — F319 Bipolar disorder, unspecified: Secondary | ICD-10-CM | POA: Diagnosis not present

## 2014-08-28 DIAGNOSIS — F411 Generalized anxiety disorder: Secondary | ICD-10-CM | POA: Insufficient documentation

## 2014-08-28 DIAGNOSIS — Z8619 Personal history of other infectious and parasitic diseases: Secondary | ICD-10-CM | POA: Insufficient documentation

## 2014-08-28 DIAGNOSIS — E785 Hyperlipidemia, unspecified: Secondary | ICD-10-CM | POA: Insufficient documentation

## 2014-08-28 DIAGNOSIS — Z79899 Other long term (current) drug therapy: Secondary | ICD-10-CM | POA: Insufficient documentation

## 2014-08-28 DIAGNOSIS — F1994 Other psychoactive substance use, unspecified with psychoactive substance-induced mood disorder: Secondary | ICD-10-CM | POA: Diagnosis not present

## 2014-08-28 DIAGNOSIS — I428 Other cardiomyopathies: Secondary | ICD-10-CM | POA: Insufficient documentation

## 2014-08-28 DIAGNOSIS — Z5900 Homelessness unspecified: Secondary | ICD-10-CM | POA: Insufficient documentation

## 2014-08-28 DIAGNOSIS — J4489 Other specified chronic obstructive pulmonary disease: Secondary | ICD-10-CM | POA: Insufficient documentation

## 2014-08-28 DIAGNOSIS — Z7982 Long term (current) use of aspirin: Secondary | ICD-10-CM | POA: Diagnosis not present

## 2014-08-28 DIAGNOSIS — F4310 Post-traumatic stress disorder, unspecified: Secondary | ICD-10-CM | POA: Insufficient documentation

## 2014-08-28 DIAGNOSIS — B20 Human immunodeficiency virus [HIV] disease: Secondary | ICD-10-CM | POA: Insufficient documentation

## 2014-08-28 DIAGNOSIS — F1026 Alcohol dependence with alcohol-induced persisting amnestic disorder: Secondary | ICD-10-CM

## 2014-08-28 DIAGNOSIS — F10939 Alcohol use, unspecified with withdrawal, unspecified: Secondary | ICD-10-CM | POA: Insufficient documentation

## 2014-08-28 DIAGNOSIS — Z862 Personal history of diseases of the blood and blood-forming organs and certain disorders involving the immune mechanism: Secondary | ICD-10-CM | POA: Insufficient documentation

## 2014-08-28 DIAGNOSIS — F1910 Other psychoactive substance abuse, uncomplicated: Secondary | ICD-10-CM

## 2014-08-28 DIAGNOSIS — I5022 Chronic systolic (congestive) heart failure: Secondary | ICD-10-CM | POA: Diagnosis not present

## 2014-08-28 DIAGNOSIS — R45851 Suicidal ideations: Secondary | ICD-10-CM | POA: Insufficient documentation

## 2014-08-28 DIAGNOSIS — Z8639 Personal history of other endocrine, nutritional and metabolic disease: Secondary | ICD-10-CM | POA: Insufficient documentation

## 2014-08-28 DIAGNOSIS — F10239 Alcohol dependence with withdrawal, unspecified: Secondary | ICD-10-CM | POA: Diagnosis not present

## 2014-08-28 DIAGNOSIS — I509 Heart failure, unspecified: Secondary | ICD-10-CM | POA: Insufficient documentation

## 2014-08-28 DIAGNOSIS — F10988 Alcohol use, unspecified with other alcohol-induced disorder: Principal | ICD-10-CM | POA: Insufficient documentation

## 2014-08-28 DIAGNOSIS — F10230 Alcohol dependence with withdrawal, uncomplicated: Secondary | ICD-10-CM

## 2014-08-28 DIAGNOSIS — F1010 Alcohol abuse, uncomplicated: Secondary | ICD-10-CM

## 2014-08-28 LAB — CBC
HCT: 49.1 % (ref 39.0–52.0)
HEMOGLOBIN: 17.8 g/dL — AB (ref 13.0–17.0)
MCH: 33.6 pg (ref 26.0–34.0)
MCHC: 36.3 g/dL — ABNORMAL HIGH (ref 30.0–36.0)
MCV: 92.6 fL (ref 78.0–100.0)
Platelets: 172 10*3/uL (ref 150–400)
RBC: 5.3 MIL/uL (ref 4.22–5.81)
RDW: 13.5 % (ref 11.5–15.5)
WBC: 4.3 10*3/uL (ref 4.0–10.5)

## 2014-08-28 LAB — COMPREHENSIVE METABOLIC PANEL
ALBUMIN: 3.7 g/dL (ref 3.5–5.2)
ALK PHOS: 58 U/L (ref 39–117)
ALT: 28 U/L (ref 0–53)
AST: 30 U/L (ref 0–37)
Anion gap: 10 (ref 5–15)
BUN: 19 mg/dL (ref 6–23)
CO2: 25 mEq/L (ref 19–32)
Calcium: 8.9 mg/dL (ref 8.4–10.5)
Chloride: 102 mEq/L (ref 96–112)
Creatinine, Ser: 1.1 mg/dL (ref 0.50–1.35)
GFR calc non Af Amer: 75 mL/min — ABNORMAL LOW (ref >90)
GFR, EST AFRICAN AMERICAN: 87 mL/min — AB (ref >90)
GLUCOSE: 65 mg/dL — AB (ref 70–99)
POTASSIUM: 4.4 meq/L (ref 3.7–5.3)
SODIUM: 137 meq/L (ref 137–147)
Total Bilirubin: 1.5 mg/dL — ABNORMAL HIGH (ref 0.3–1.2)
Total Protein: 6.7 g/dL (ref 6.0–8.3)

## 2014-08-28 LAB — RAPID URINE DRUG SCREEN, HOSP PERFORMED
AMPHETAMINES: NOT DETECTED
Barbiturates: NOT DETECTED
Benzodiazepines: NOT DETECTED
COCAINE: POSITIVE — AB
OPIATES: NOT DETECTED
Tetrahydrocannabinol: NOT DETECTED

## 2014-08-28 LAB — ETHANOL: Alcohol, Ethyl (B): <11 mg/dL (ref 0–11)

## 2014-08-28 LAB — SALICYLATE LEVEL

## 2014-08-28 LAB — ACETAMINOPHEN LEVEL: Acetaminophen (Tylenol), Serum: <15 ug/mL (ref 10–30)

## 2014-08-28 MED ORDER — ISOSORBIDE DINITRATE 5 MG PO TABS
5.0000 mg | ORAL_TABLET | Freq: Three times a day (TID) | ORAL | Status: DC
  Administered 2014-08-28: 5 mg via ORAL
  Filled 2014-08-28 (×3): qty 1

## 2014-08-28 MED ORDER — POTASSIUM CHLORIDE CRYS ER 10 MEQ PO TBCR
10.0000 meq | EXTENDED_RELEASE_TABLET | Freq: Every day | ORAL | Status: DC
  Administered 2014-08-29 – 2014-08-31 (×3): 10 meq via ORAL
  Filled 2014-08-28 (×4): qty 1

## 2014-08-28 MED ORDER — ALBUTEROL SULFATE HFA 108 (90 BASE) MCG/ACT IN AERS: 2.0000 | INHALATION_SPRAY | RESPIRATORY_TRACT | Status: DC | PRN

## 2014-08-28 MED ORDER — RISPERIDONE 0.5 MG PO TABS: 0.5000 mg | ORAL_TABLET | Freq: Every day | ORAL | Status: DC

## 2014-08-28 MED ORDER — EMTRICITABINE-TENOFOVIR DF 200-300 MG PO TABS
1.0000 | ORAL_TABLET | Freq: Every day | ORAL | Status: DC
  Administered 2014-08-29 – 2014-08-31 (×3): 1 via ORAL
  Filled 2014-08-28 (×3): qty 1

## 2014-08-28 MED ORDER — HYDROXYZINE HCL 25 MG PO TABS: 25.0000 mg | ORAL_TABLET | Freq: Two times a day (BID) | ORAL | Status: DC | PRN

## 2014-08-28 MED ORDER — RALTEGRAVIR POTASSIUM 400 MG PO TABS
400.0000 mg | ORAL_TABLET | Freq: Two times a day (BID) | ORAL | Status: DC
  Filled 2014-08-28: qty 1

## 2014-08-28 MED ORDER — METOPROLOL SUCCINATE ER 25 MG PO TB24
25.0000 mg | ORAL_TABLET | Freq: Every day | ORAL | Status: DC
  Administered 2014-08-29 – 2014-08-30 (×2): 25 mg via ORAL
  Filled 2014-08-28 (×4): qty 1

## 2014-08-28 MED ORDER — VALACYCLOVIR HCL 500 MG PO TABS
500.0000 mg | ORAL_TABLET | Freq: Two times a day (BID) | ORAL | Status: DC
  Administered 2014-08-28 – 2014-08-31 (×6): 500 mg via ORAL
  Filled 2014-08-28 (×7): qty 1

## 2014-08-28 MED ORDER — VALACYCLOVIR HCL 500 MG PO TABS
500.0000 mg | ORAL_TABLET | Freq: Two times a day (BID) | ORAL | Status: DC
  Filled 2014-08-28: qty 1

## 2014-08-28 MED ORDER — BUPROPION HCL ER (XL) 150 MG PO TB24
150.0000 mg | ORAL_TABLET | Freq: Every day | ORAL | Status: DC
  Administered 2014-08-29 – 2014-08-31 (×3): 150 mg via ORAL
  Filled 2014-08-28 (×4): qty 1

## 2014-08-28 MED ORDER — SPIRONOLACTONE 12.5 MG HALF TABLET
12.5000 mg | ORAL_TABLET | Freq: Every day | ORAL | Status: DC
  Administered 2014-08-28: 17:00:00 via ORAL
  Filled 2014-08-28: qty 1

## 2014-08-28 MED ORDER — EMTRICITABINE-TENOFOVIR DF 200-300 MG PO TABS
1.0000 | ORAL_TABLET | Freq: Every day | ORAL | Status: DC
  Administered 2014-08-28: 1 via ORAL
  Filled 2014-08-28: qty 1

## 2014-08-28 MED ORDER — RALTEGRAVIR POTASSIUM 400 MG PO TABS
400.0000 mg | ORAL_TABLET | Freq: Two times a day (BID) | ORAL | Status: DC
  Administered 2014-08-28 – 2014-08-31 (×6): 400 mg via ORAL
  Filled 2014-08-28 (×7): qty 1

## 2014-08-28 MED ORDER — LISINOPRIL 2.5 MG PO TABS
2.5000 mg | ORAL_TABLET | Freq: Every day | ORAL | Status: DC
  Administered 2014-08-29: 2.5 mg via ORAL
  Administered 2014-08-30: 09:00:00 via ORAL
  Filled 2014-08-28 (×3): qty 1

## 2014-08-28 MED ORDER — FUROSEMIDE 40 MG PO TABS
40.0000 mg | ORAL_TABLET | Freq: Every day | ORAL | Status: DC
  Administered 2014-08-29 – 2014-08-30 (×2): 40 mg via ORAL
  Filled 2014-08-28: qty 2
  Filled 2014-08-28 (×3): qty 1

## 2014-08-28 MED ORDER — NICOTINE 21 MG/24HR TD PT24
21.0000 mg | MEDICATED_PATCH | Freq: Every day | TRANSDERMAL | Status: DC
Start: 2014-08-29 — End: 2014-08-31
  Administered 2014-08-29 – 2014-08-31 (×3): 21 mg via TRANSDERMAL
  Filled 2014-08-28 (×4): qty 1

## 2014-08-28 MED ORDER — ADULT MULTIVITAMIN W/MINERALS CH
1.0000 | ORAL_TABLET | Freq: Every day | ORAL | Status: DC
  Administered 2014-08-29 – 2014-08-31 (×3): 1 via ORAL
  Filled 2014-08-28 (×4): qty 1

## 2014-08-28 MED ORDER — CHLORDIAZEPOXIDE HCL 25 MG PO CAPS: 25.0000 mg | ORAL_CAPSULE | Freq: Four times a day (QID) | ORAL | Status: DC | PRN

## 2014-08-28 MED ORDER — RALTEGRAVIR POTASSIUM 400 MG PO TABS
400.0000 mg | ORAL_TABLET | Freq: Every morning | ORAL | Status: DC
  Filled 2014-08-28 (×2): qty 1

## 2014-08-28 MED ORDER — METOPROLOL TARTRATE 25 MG PO TABS: 25.0000 mg | ORAL_TABLET | Freq: Every day | ORAL | Status: DC

## 2014-08-28 MED ORDER — LISINOPRIL 2.5 MG PO TABS
2.5000 mg | ORAL_TABLET | Freq: Every day | ORAL | Status: DC
  Administered 2014-08-28: 2.5 mg via ORAL
  Filled 2014-08-28 (×2): qty 1

## 2014-08-28 MED ORDER — HYDROXYZINE HCL 25 MG PO TABS: 25.0000 mg | ORAL_TABLET | Freq: Four times a day (QID) | ORAL | Status: DC | PRN

## 2014-08-28 MED ORDER — BUPROPION HCL ER (XL) 150 MG PO TB24
150.0000 mg | ORAL_TABLET | Freq: Every day | ORAL | Status: DC
  Administered 2014-08-28: 150 mg via ORAL
  Filled 2014-08-28: qty 1

## 2014-08-28 MED ORDER — TRAZODONE HCL 100 MG PO TABS: 100.0000 mg | ORAL_TABLET | Freq: Every day | ORAL | Status: DC

## 2014-08-28 MED ORDER — POTASSIUM CHLORIDE CRYS ER 20 MEQ PO TBCR
10.0000 meq | EXTENDED_RELEASE_TABLET | Freq: Every day | ORAL | Status: DC
  Administered 2014-08-28: 10 meq via ORAL
  Filled 2014-08-28: qty 0.5

## 2014-08-28 MED ORDER — ASPIRIN 81 MG PO CHEW
81.0000 mg | CHEWABLE_TABLET | Freq: Every day | ORAL | Status: DC
  Administered 2014-08-29 – 2014-08-31 (×3): 81 mg via ORAL
  Filled 2014-08-28 (×4): qty 1

## 2014-08-28 MED ORDER — ISOSORBIDE DINITRATE 5 MG PO TABS
5.0000 mg | ORAL_TABLET | Freq: Three times a day (TID) | ORAL | Status: DC
  Administered 2014-08-29 – 2014-08-31 (×7): 5 mg via ORAL
  Filled 2014-08-28 (×7): qty 1

## 2014-08-28 MED ORDER — ASPIRIN 81 MG PO CHEW
81.0000 mg | CHEWABLE_TABLET | Freq: Every day | ORAL | Status: DC
  Administered 2014-08-28: 81 mg via ORAL
  Filled 2014-08-28: qty 1

## 2014-08-28 MED ORDER — DIGOXIN 125 MCG PO TABS
0.1250 mg | ORAL_TABLET | Freq: Every day | ORAL | Status: DC
  Administered 2014-08-28: 0.125 mg via ORAL
  Filled 2014-08-28: qty 1

## 2014-08-28 MED ORDER — NICOTINE 21 MG/24HR TD PT24
21.0000 mg | MEDICATED_PATCH | Freq: Every day | TRANSDERMAL | Status: DC
  Administered 2014-08-28: 21 mg via TRANSDERMAL
  Filled 2014-08-28: qty 1

## 2014-08-28 MED ORDER — FUROSEMIDE 40 MG PO TABS
40.0000 mg | ORAL_TABLET | Freq: Every day | ORAL | Status: DC
  Administered 2014-08-28: 40 mg via ORAL
  Filled 2014-08-28: qty 1

## 2014-08-28 MED ORDER — SPIRONOLACTONE 12.5 MG HALF TABLET
12.5000 mg | ORAL_TABLET | Freq: Every day | ORAL | Status: DC
  Administered 2014-08-29 – 2014-08-30 (×2): via ORAL
  Filled 2014-08-28 (×3): qty 1

## 2014-08-28 MED ORDER — DIGOXIN 125 MCG PO TABS
0.1250 mg | ORAL_TABLET | Freq: Every day | ORAL | Status: DC
  Administered 2014-08-29 – 2014-08-31 (×3): 0.125 mg via ORAL
  Filled 2014-08-28 (×3): qty 1

## 2014-08-28 MED ORDER — RISPERIDONE 0.5 MG PO TABS
0.5000 mg | ORAL_TABLET | Freq: Every day | ORAL | Status: DC
  Administered 2014-08-28 – 2014-08-30 (×4): 0.5 mg via ORAL
  Filled 2014-08-28 (×7): qty 1

## 2014-08-28 NOTE — Consult Note (Addendum)
[facility] Face-to-Face Psychiatry Consult   Reason for Consult:  Substance abuse, depression Referring Physician:  EDP  Ryan Long is an 53 y.o. male. Total Time spent with patient: 20 minutes  Assessment: AXIS I:  Alcohol Abuse, Substance Abuse and Substance Induced Mood Disorder AXIS II:  Deferred AXIS III:   Past Medical History  Diagnosis Date  . HIV (human immunodeficiency virus infection)   . Chronic systolic heart failure   . Hypertension   . Genital herpes   . Active smoker   . AR (allergic rhinitis)   . HLD (hyperlipidemia)   . NICM (nonischemic cardiomyopathy)     Echocardiogram 06/28/11: EF 30-35%, mild MR, mild LAE;  No CAD by coronary CT angiogram 3/12 at [facility]  . NSVT (nonsustained ventricular tachycardia)   . Depression   . Bipolar 1 disorder   . Alcohol abuse   . Crack cocaine use   . COPD (chronic obstructive pulmonary disease)   . CHF (congestive heart failure)   . Anxiety   . PTSD (post-traumatic stress disorder)    AXIS IV:  housing problems, other psychosocial or environmental problems, problems related to social environment and problems with primary support group AXIS V:  51-60 moderate symptoms  Plan:  Supportive therapy provided about ongoing stressors.  Subjective:   Ryan Long is a 53 y.o. male patient admitted [facility] observation for stability.  HPI:  Patient has been using crack and alcohol.  He came to the ED depressed with fleeting suicidal ideations of possibly walking in traffic.  Ryan Long often has these symptoms but has never had any attempts.  Substance abuse is his main issue and housing, waiting for a place--recently approved for section 8 and awaiting an apartment.  Meanwhile, he may have exhausted his stay at the shelter.  Denies homicidal ideations, patient appears to be at his baseline. HPI Elements:   Location:  generalized. Quality:  acute. Severity:  moderate. Timing:  intermittent. Duration:  days or so. Context:   stressors.  Past Psychiatric History: Past Medical History  Diagnosis Date  . HIV (human immunodeficiency virus infection)   . Chronic systolic heart failure   . Hypertension   . Genital herpes   . Active smoker   . AR (allergic rhinitis)   . HLD (hyperlipidemia)   . NICM (nonischemic cardiomyopathy)     Echocardiogram 06/28/11: EF 30-35%, mild MR, mild LAE;  No CAD by coronary CT angiogram 3/12 at [facility]  . NSVT (nonsustained ventricular tachycardia)   . Depression   . Bipolar 1 disorder   . Alcohol abuse   . Crack cocaine use   . COPD (chronic obstructive pulmonary disease)   . CHF (congestive heart failure)   . Anxiety   . PTSD (post-traumatic stress disorder)     reports that he has been smoking Cigarettes.  He has a 15 pack-year smoking history. He has never used smokeless tobacco. He reports that he uses illicit drugs (Marijuana). He reports that he does not drink alcohol. Family History  Problem Relation Age of Onset  . Hypertension Father   . CAD Father            Allergies:  No Known Allergies  ACT Assessment Complete:  Yes:    Educational Status    Risk to Self: Risk to self with the past 6 months Is patient at risk for suicide?: Yes Substance abuse history and/or treatment for substance abuse?: No  Risk to Others:    Abuse:  Prior Inpatient Therapy:    Prior Outpatient Therapy:    Additional Information:                    Objective: Blood pressure 103/71, pulse 103, temperature 98.4 F (36.9 C), temperature source Oral, resp. rate 20, SpO2 98.00%.There is no weight on file to calculate BMI. Results for orders placed during the hospital encounter of 08/28/14 (from the past 72 hour(s))  CBC     Status: Abnormal   Collection Time    08/28/14 12:36 PM      Result Value Ref Range   WBC 4.3  4.0 - 10.5 K/uL   RBC 5.30  4.22 - 5.81 MIL/uL   Hemoglobin 17.8 (*) 13.0 - 17.0 g/dL   HCT 49.1  39.0 - 52.0 %   MCV 92.6   78.0 - 100.0 fL   MCH 33.6  26.0 - 34.0 pg   MCHC 36.3 (*) 30.0 - 36.0 g/dL   RDW 13.5  11.5 - 15.5 %   Platelets 172  150 - 400 K/uL  COMPREHENSIVE METABOLIC PANEL     Status: Abnormal   Collection Time    08/28/14 12:36 PM      Result Value Ref Range   Sodium 137  137 - 147 mEq/L   Potassium 4.4  3.7 - 5.3 mEq/L   Chloride 102  96 - 112 mEq/L   CO2 25  19 - 32 mEq/L   Glucose, Bld 65 (*) 70 - 99 mg/dL   BUN 19  6 - 23 mg/dL   Creatinine, Ser 1.10  0.50 - 1.35 mg/dL   Calcium 8.9  8.4 - 10.5 mg/dL   Total Protein 6.7  6.0 - 8.3 g/dL   Albumin 3.7  3.5 - 5.2 g/dL   AST 30  0 - 37 U/L   ALT 28  0 - 53 U/L   Alkaline Phosphatase 58  39 - 117 U/L   Total Bilirubin 1.5 (*) 0.3 - 1.2 mg/dL   GFR calc non Af Amer 75 (*) >90 mL/min   GFR calc Af Amer 87 (*) >90 mL/min   Comment: (NOTE)     The eGFR has been calculated using the CKD EPI equation.     This calculation has not been validated in all clinical situations.     eGFR's persistently <90 mL/min signify possible Chronic Kidney     Disease.   Anion gap 10  5 - 15  ETHANOL     Status: None   Collection Time    08/28/14 12:36 PM      Result Value Ref Range   Alcohol, Ethyl (B) <11  0 - 11 mg/dL   Comment:            LOWEST DETECTABLE LIMIT FOR     SERUM ALCOHOL IS 11 mg/dL     FOR MEDICAL PURPOSES ONLY  ACETAMINOPHEN LEVEL     Status: None   Collection Time    08/28/14 12:36 PM      Result Value Ref Range   Acetaminophen (Tylenol), Serum <15.0  10 - 30 ug/mL   Comment:            THERAPEUTIC CONCENTRATIONS VARY     SIGNIFICANTLY. A RANGE OF 10-30     ug/mL MAY BE AN EFFECTIVE     CONCENTRATION FOR MANY PATIENTS.     HOWEVER, SOME ARE BEST TREATED     AT CONCENTRATIONS OUTSIDE THIS     RANGE.  ACETAMINOPHEN CONCENTRATIONS     >150 ug/mL AT 4 HOURS AFTER     INGESTION AND >50 ug/mL AT 12     HOURS AFTER INGESTION ARE     OFTEN ASSOCIATED WITH TOXIC     REACTIONS.  SALICYLATE LEVEL     Status: Abnormal    Collection Time    08/28/14 12:36 PM      Result Value Ref Range   Salicylate Lvl <9.7 (*) 2.8 - 20.0 mg/dL  URINE RAPID DRUG SCREEN (HOSP PERFORMED)     Status: Abnormal   Collection Time    08/28/14 12:47 PM      Result Value Ref Range   Opiates NONE DETECTED  NONE DETECTED   Cocaine POSITIVE (*) NONE DETECTED   Benzodiazepines NONE DETECTED  NONE DETECTED   Amphetamines NONE DETECTED  NONE DETECTED   Tetrahydrocannabinol NONE DETECTED  NONE DETECTED   Barbiturates NONE DETECTED  NONE DETECTED   Comment:            DRUG SCREEN FOR MEDICAL PURPOSES     ONLY.  IF CONFIRMATION IS NEEDED     FOR ANY PURPOSE, NOTIFY LAB     WITHIN 5 DAYS.                LOWEST DETECTABLE LIMITS     FOR URINE DRUG SCREEN     Drug Class       Cutoff (ng/mL)     Amphetamine      1000     Barbiturate      200     Benzodiazepine   673     Tricyclics       419     Opiates          300     Cocaine          300     THC              50   Labs are reviewed and are pertinent for no medical issues noted.  No current facility-administered medications for this encounter.   Current Outpatient Prescriptions  Medication Sig Dispense Refill  . albuterol (PROVENTIL HFA;VENTOLIN HFA) 108 (90 BASE) MCG/ACT inhaler Inhale 2 puffs into the lungs every 4 (four) hours as needed for shortness of breath.      Marland Kitchen aspirin 81 MG chewable tablet Chew 81 mg by mouth every morning.      Marland Kitchen buPROPion (WELLBUTRIN XL) 150 MG 24 hr tablet Take 1 tablet (150 mg total) by mouth daily. For depression  30 tablet  0  . Carboxymethylcellul-Glycerin (CLEAR EYES FOR DRY EYES OP) Place 2 drops into both eyes 2 (two) times daily.      . cetirizine (ZYRTEC) 10 MG tablet Take 1 tablet (10 mg total) by mouth daily. For allergies      . clotrimazole (LOTRIMIN) 1 % cream Apply 1 application topically daily. Applies to feet for athlete's foot.      . digoxin (LANOXIN) 0.125 MG tablet Take 1 tablet (125 mcg total) by mouth daily. For control of  heart arrythmia.      Marland Kitchen emtricitabine-tenofovir (TRUVADA) 200-300 MG per tablet Take 1 tablet by mouth daily. For HIV infection  30 tablet  6  . furosemide (LASIX) 40 MG tablet Take 1 tablet (40 mg total) by mouth daily. For edema  30 tablet  0  . hydrOXYzine (ATARAX/VISTARIL) 25 MG tablet Take 1-2 tablets (25-50 mg total) by mouth 3 (three) times daily  as needed for anxiety.  60 tablet  0  . isosorbide dinitrate (ISORDIL) 5 MG tablet Take 1 tablet (5 mg total) by mouth 3 (three) times daily. For heart condition  90 tablet  0  . lisinopril (PRINIVIL,ZESTRIL) 2.5 MG tablet Take 2.5 mg by mouth daily.      . metoprolol succinate (TOPROL-XL) 25 MG 24 hr tablet Take 1 tablet (25 mg total) by mouth daily. For high blood pressure      . Multiple Vitamin (MULTI-VITAMINS) TABS Take 1 tablet by mouth daily. For low vitamin  30 tablet    . nicotine (RA NICOTINE) 21 mg/24hr patch Place 1 patch (21 mg total) onto the skin daily. For nicotine addiction  28 patch  0  . potassium chloride (K-DUR,KLOR-CON) 10 MEQ tablet Take 1 tablet (10 mEq total) by mouth daily. For low potassium  30 tablet  0  . raltegravir (ISENTRESS) 400 MG tablet Take 1 tablet (400 mg total) by mouth 2 (two) times daily. For HIV infection  60 tablet  6  . risperiDONE (RISPERDAL) 0.5 MG tablet Take 0.5 mg by mouth at bedtime.      Marland Kitchen spironolactone (ALDACTONE) 25 MG tablet Take 0.5 tablets (12.5 mg total) by mouth daily. For heart condition  30 tablet  0  . traZODone (DESYREL) 150 MG tablet Take 75 mg by mouth at bedtime.      . valACYclovir (VALTREX) 500 MG tablet Take 1 tablet (500 mg total) by mouth 2 (two) times daily. For Herpes.        Psychiatric Specialty Exam:     Blood pressure 103/71, pulse 103, temperature 98.4 F (36.9 C), temperature source Oral, resp. rate 20, SpO2 98.00%.There is no weight on file to calculate BMI.  General Appearance: Casual  Eye Contact::  Fair  Speech:  Normal Rate  Volume:  Normal  Mood:  Depressed   Affect:  Non-Congruent  Thought Process:  Coherent  Orientation:  Full (Time, Place, and Person)  Thought Content:  WDL  Suicidal Thoughts:  Yes.  without intent/plan  Homicidal Thoughts:  No  Memory:  Immediate;   Fair Recent;   Fair Remote;   Fair  Judgement:  Fair  Insight:  Fair  Psychomotor Activity:  Normal  Concentration:  Fair  Recall:  [employer] [place]: Fair  Akathisia:  No  Handed:  Right  AIMS (if indicated):     Assets:  Communication Skills Leisure Time Resilience  Sleep:      Musculoskeletal: Strength & Muscle Tone: within normal limits Gait & Station: normal Patient leans: N/A  Treatment Plan Summary: Admit to [facility] observation unit for stabilization  Waylan Boga, PMH-NP 08/28/2014 4:12 PM  Patient seen, evaluated and I agree with notes by Nurse Practitioner. Corena Pilgrim, MD

## 2014-08-28 NOTE — Plan of Care (Signed)
[facility] Observation Crisis Plan  Reason for Crisis Plan:  Crisis Stabilization   Plan of Care:  Referral for Telepsychiatry/Psychiatric Consult  Family Support:     none    Current Living Environment:  Living Arrangements: Alone  Insurance:   Hospital Account   Name Acct ID Class Status Primary Coverage   Ryan Long, Ryan Long [id number] BEHAVIORAL HEALTH OBSERVATION Open None        Guarantor Account (for Hospital Account [id number])   Name Relation to Pt Service Area Active? Acct Type   Ryan Long Self [facility] Yes Behavioral Health   Address Phone       [address] [place], [place] [address] [phone](H)          Coverage Information (for Hospital Account [id number])   Not on file      Legal Guardian:   self    Primary Care Provider:  PROVIDER NOT IN SYSTEM  Current Outpatient Providers:  [facility]  Psychiatrist:   yes   Counselor/Therapist:   yes   Compliant with Medications:  Yes  Additional Information:   Ryan Long 9/11/201511:05 PM

## 2014-08-28 NOTE — ED Provider Notes (Signed)
CSN: [id number]     Arrival date & time 08/28/14  1132 History  This chart was scribed for Ryan Horseman, PA, working with Purvis Sheffield, MD found by Elon Spanner, ED Scribe. This patient was seen in room WTR3/WLPT3 and the patient's care was started at 12:41 PM.   Chief Complaint  Patient presents with  . Suicidal   The history is provided by the patient and the police. No language interpreter was used.    HPI Comments: Ryan Long is a 53 y.o. male with a history of PTSD, depression, Bipolar 1, polysubstance abuse brought in by police who presents to the Emergency Department with a chief complaint of suicidal ideation with a plan onset 5 days ago.  Patient states he has a plan to "step in front of a truck" and states he asked the police to bring him to the ED.  Patient reports he used crack-cocaine and alcohol today.  Patient denies auditory hallucinations but reports visual hallucinations described as shadows.  He denies any interaction with the shadows.  He reports a history of similar episodes with the most recent hospitalization in May.  Patient was seen at [facility] 1 week ago and is scheduled to be seen again on 11/4.  Patient denies HI, CP, SOB, nausea, vomiting diarrhea, constipation.  Patient denies alcohol use today.     Past Medical History  Diagnosis Date  . HIV (human immunodeficiency virus infection)   . Chronic systolic heart failure   . Hypertension   . Genital herpes   . Active smoker   . AR (allergic rhinitis)   . HLD (hyperlipidemia)   . NICM (nonischemic cardiomyopathy)     Echocardiogram 06/28/11: EF 30-35%, mild MR, mild LAE;  No CAD by coronary CT angi[facility] 3/12 at [facility]  . NSVT (nonsustained ventricular tachycardia)   . Depression   . Bipolar 1 disorder   . Alcohol abuse   . Crack cocaine use   . COPD (chronic obstructive pulmonary disease)   . CHF (congestive heart failure)   . Anxiety   . PTSD (post-traumatic stress disorder)     History reviewed. No pertinent past surgical history. Family History  Problem Relation Age of Onset  . Hypertension Father   . CAD Father    History  Substance Use Topics  . Smoking status: Current Every Day Smoker -- 0.50 packs/day for 30 years    Types: Cigarettes  . Smokeless tobacco: Never Used     Comment: 10 ciggerettes daily  . Alcohol Use: No     Comment: pt reports he has nopt been using alcohol since his last admission    Review of Systems  Respiratory: Negative for shortness of breath.   Cardiovascular: Negative for chest pain.  Gastrointestinal: Negative for nausea, vomiting and diarrhea.  Psychiatric/Behavioral: Positive for suicidal ideas and hallucinations.       No HI      Allergies  Review of patient's allergies indicates no known allergies.  Home Medications   Prior to Admission medications   Medication Sig Start Date End Date Taking? Authorizing Provider  albuterol (PROVENTIL HFA;VENTOLIN HFA) 108 (90 BASE) MCG/ACT inhaler Inhale 2 puffs into the lungs as needed for shortness of breath. Per pt med is as needed with no frequency for asthma. 07/14/14   Sanjuana Kava, NP  aspirin 81 MG chewable tablet Chew 1 tablet (81 mg total) by mouth daily. For heart health 07/14/14   Sanjuana Kava, NP  buPROPion (WELLBUTRIN XL) 150  MG 24 hr tablet Take 1 tablet (150 mg total) by mouth daily. For depression 07/14/14   Sanjuana Kava, NP  cetirizine (ZYRTEC) 10 MG tablet Take 1 tablet (10 mg total) by mouth daily. For allergies 07/14/14   Sanjuana Kava, NP  digoxin (LANOXIN) 0.125 MG tablet Take 1 tablet (125 mcg total) by mouth daily. For control of heart arrythmia. 07/14/14   Sanjuana Kava, NP  emtricitabine-tenofovir (TRUVADA) 200-300 MG per tablet Take 1 tablet by mouth daily. For HIV infection 07/14/14   Sanjuana Kava, NP  furosemide (LASIX) 40 MG tablet Take 1 tablet (40 mg total) by mouth daily. For edema 07/14/14   Sanjuana Kava, NP  hydrOXYzine (ATARAX/VISTARIL) 25 MG  tablet Take 1-2 tablets (25-50 mg total) by mouth 3 (three) times daily as needed for anxiety. 07/14/14   Sanjuana Kava, NP  isosorbide dinitrate (ISORDIL) 5 MG tablet Take 1 tablet (5 mg total) by mouth 3 (three) times daily. For heart condition 07/14/14   Sanjuana Kava, NP  lisinopril (PRINIVIL,ZESTRIL) 5 MG tablet Take 1 tablet (5 mg total) by mouth daily. For hypertension 07/14/14   Sanjuana Kava, NP  metoprolol succinate (TOPROL-XL) 25 MG 24 hr tablet Take 1 tablet (25 mg total) by mouth daily. For high blood pressure 07/14/14   Sanjuana Kava, NP  Multiple Vitamin (MULTI-VITAMINS) TABS Take 1 tablet by mouth daily. For low vitamin 07/14/14   Sanjuana Kava, NP  nicotine (RA NICOTINE) 21 mg/24hr patch Place 1 patch (21 mg total) onto the skin daily. For nicotine addiction 07/14/14   Sanjuana Kava, NP  potassium chloride (K-DUR,KLOR-CON) 10 MEQ tablet Take 1 tablet (10 mEq total) by mouth daily. For low potassium 07/14/14   Sanjuana Kava, NP  raltegravir (ISENTRESS) 400 MG tablet Take 1 tablet (400 mg total) by mouth 2 (two) times daily. For HIV infection 07/14/14   Sanjuana Kava, NP  sertraline (ZOLOFT) 50 MG tablet Take 1 tablet (50 mg total) by mouth daily. For depression 07/14/14   Sanjuana Kava, NP  spironolactone (ALDACTONE) 25 MG tablet Take 0.5 tablets (12.5 mg total) by mouth daily. For heart condition 07/14/14   Sanjuana Kava, NP  traZODone (DESYREL) 150 MG tablet Take 75 mg (1/2 tablet) at bedtime: For sleep 07/14/14   Sanjuana Kava, NP  valACYclovir (VALTREX) 500 MG tablet Take 1 tablet (500 mg total) by mouth 2 (two) times daily. For Herpes. 07/14/14   Sanjuana Kava, NP   BP 103/71  Pulse 103  Temp(Src) 98.4 F (36.9 C) (Oral)  Resp 20  SpO2 98% Physical Exam  Nursing note and vitals reviewed. Constitutional: He is oriented to person, place, and time. He appears well-developed and well-nourished. No distress.  HENT:  Head: Normocephalic and atraumatic.  Eyes: Conjunctivae and EOM are  normal. Pupils are equal, round, and reactive to light. Right eye exhibits no discharge. Left eye exhibits no discharge. No scleral icterus.  Neck: Normal range of motion. Neck supple. No JVD present. No tracheal deviation present.  Cardiovascular: Normal rate, regular rhythm and normal heart sounds.  Exam reveals no gallop and no friction rub.   No murmur heard. Pulmonary/Chest: Effort normal and breath sounds normal. No respiratory distress. He has no wheezes. He has no rales. He exhibits no tenderness.  Abdominal: Soft. He exhibits no distension and no mass. There is no tenderness. There is no rebound and no guarding.  Musculoskeletal: Normal range of motion.  He exhibits no edema and no tenderness.  Neurological: He is alert and oriented to person, place, and time.  Skin: Skin is warm and dry.  Psychiatric: He has a normal mood and affect. His behavior is normal. Judgment and thought content normal.    ED Course  Procedures (including critical care time)  DIAGNOSTIC STUDIES: Oxygen Saturation is 98% on RA, normal by my interpretation.    COORDINATION OF CARE:  12:51 PM Discussed plan to have patient seen by behavior health.    Results for orders placed during the hospital encounter of 08/28/14  CBC      Result Value Ref Range   WBC 4.3  4.0 - 10.5 K/uL   RBC 5.30  4.22 - 5.81 MIL/uL   Hemoglobin 17.8 (*) 13.0 - 17.0 g/dL   HCT 16.1  09.6 - 04.5 %   MCV 92.6  78.0 - 100.0 fL   MCH 33.6  26.0 - 34.0 pg   MCHC 36.3 (*) 30.0 - 36.0 g/dL   RDW 40.9  81.1 - 91.4 %   Platelets 172  150 - 400 K/uL  COMPREHENSIVE METABOLIC PANEL      Result Value Ref Range   Sodium 137  137 - 147 mEq/L   Potassium 4.4  3.7 - 5.3 mEq/L   Chloride 102  96 - 112 mEq/L   CO2 25  19 - 32 mEq/L   Glucose, Bld 65 (*) 70 - 99 mg/dL   BUN 19  6 - 23 mg/dL   Creatinine, Ser 7.82  0.50 - 1.35 mg/dL   Calcium 8.9  8.4 - 95.6 mg/dL   Total Protein 6.7  6.0 - 8.3 g/dL   Albumin 3.7  3.5 - 5.2 g/dL   AST 30   0 - 37 U/L   ALT 28  0 - 53 U/L   Alkaline Phosphatase 58  39 - 117 U/L   Total Bilirubin 1.5 (*) 0.3 - 1.2 mg/dL   GFR calc non Af Amer 75 (*) >90 mL/min   GFR calc Af Amer 87 (*) >90 mL/min   Anion gap 10  5 - 15  ETHANOL      Result Value Ref Range   Alcohol, Ethyl (B) <11  0 - 11 mg/dL  ACETAMINOPHEN LEVEL      Result Value Ref Range   Acetaminophen (Tylenol), Serum <15.0  10 - 30 ug/mL  SALICYLATE LEVEL      Result Value Ref Range   Salicylate Lvl <2.0 (*) 2.8 - 20.0 mg/dL  URINE RAPID DRUG SCREEN (HOSP PERFORMED)      Result Value Ref Range   Opiates NONE DETECTED  NONE DETECTED   Cocaine POSITIVE (*) NONE DETECTED   Benzodiazepines NONE DETECTED  NONE DETECTED   Amphetamines NONE DETECTED  NONE DETECTED   Tetrahydrocannabinol NONE DETECTED  NONE DETECTED   Barbiturates NONE DETECTED  NONE DETECTED   No results found.   Imaging Review No results found.   EKG Interpretation None      MDM   Final diagnoses:  Substance induced mood disorder  Alcohol dependence with uncomplicated withdrawal  Cocaine abuse, episodic use    Patient with suicidal thoughts. TTS consult pending.  Dispo per TTS.  Medically clear.  I personally performed the services described in this documentation, which was scribed in my presence. The recorded information has been reviewed and is accurate.    Ryan Horseman, PA-C 08/28/14 1651

## 2014-08-28 NOTE — Progress Notes (Signed)
[facility] INPATIENT:  Family/Significant Other Suicide Prevention Education  Suicide Prevention Education:  Patient Refusal for Family/Significant Other Suicide Prevention Education: The patient Ryan Long has refused to provide written consent for family/significant other to be provided Family/Significant Other Suicide Prevention Education during admission and/or prior to discharge.  Physician notified.  Hoover Browns 08/28/2014, 11:10 PM

## 2014-08-28 NOTE — ED Notes (Signed)
Bed: [facility] Expected date:  Expected time:  Means of arrival:  Comments: TR 3
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
## 2014-08-28 NOTE — ED Notes (Signed)
Pt has one personal belongings bag, behind triage nursing station.  Bag contains: 1 black book bag, 1 pack of cigerettes, brown and white strip polo shirts, khakis, black and white sneakers, 1 pair of socks,

## 2014-08-28 NOTE — ED Notes (Signed)
Pt here with suicidal thoughts.  Hx of same.  Thoughts occuring since Monday.  States would jump in front of a truck.  No hallucinations.  No HI.  Denies SI attempt today.

## 2014-08-29 DIAGNOSIS — F1910 Other psychoactive substance abuse, uncomplicated: Secondary | ICD-10-CM

## 2014-08-29 DIAGNOSIS — F1010 Alcohol abuse, uncomplicated: Secondary | ICD-10-CM

## 2014-08-29 DIAGNOSIS — F1994 Other psychoactive substance use, unspecified with psychoactive substance-induced mood disorder: Secondary | ICD-10-CM

## 2014-08-29 MED ORDER — RISPERIDONE 0.5 MG PO TABS: 0.5000 mg | ORAL_TABLET | Freq: Every day | ORAL | Status: DC

## 2014-08-29 MED ORDER — CHLORDIAZEPOXIDE HCL 25 MG PO CAPS: 25.0000 mg | ORAL_CAPSULE | Freq: Four times a day (QID) | ORAL | Status: DC | PRN

## 2014-08-29 MED ORDER — SPIRONOLACTONE 12.5 MG HALF TABLET: 12.5000 mg | ORAL_TABLET | Freq: Every day | ORAL | Status: DC

## 2014-08-29 MED ORDER — ISOSORBIDE DINITRATE 5 MG PO TABS: 5.0000 mg | ORAL_TABLET | Freq: Three times a day (TID) | ORAL | Status: DC

## 2014-08-29 MED ORDER — VALACYCLOVIR HCL 500 MG PO TABS: 500.0000 mg | ORAL_TABLET | Freq: Two times a day (BID) | ORAL | Status: DC

## 2014-08-29 MED ORDER — HYDROXYZINE HCL 25 MG PO TABS
25.0000 mg | ORAL_TABLET | Freq: Four times a day (QID) | ORAL | Status: DC | PRN
  Administered 2014-08-30 (×2): 25 mg via ORAL
  Filled 2014-08-29 (×2): qty 1

## 2014-08-29 MED ORDER — BUPROPION HCL ER (XL) 150 MG PO TB24: 150.0000 mg | ORAL_TABLET | Freq: Every day | ORAL | Status: DC

## 2014-08-29 MED ORDER — TRAZODONE HCL 100 MG PO TABS
100.0000 mg | ORAL_TABLET | Freq: Every day | ORAL | Status: DC
  Administered 2014-08-29 – 2014-08-30 (×3): 100 mg via ORAL
  Filled 2014-08-29 (×6): qty 1

## 2014-08-29 MED ORDER — ASPIRIN 81 MG PO CHEW: 81.0000 mg | CHEWABLE_TABLET | Freq: Every day | ORAL | Status: DC

## 2014-08-29 MED ORDER — LISINOPRIL 2.5 MG PO TABS: 2.5000 mg | ORAL_TABLET | Freq: Every day | ORAL | Status: DC

## 2014-08-29 MED ORDER — METOPROLOL TARTRATE 25 MG PO TABS: 25.0000 mg | ORAL_TABLET | Freq: Every day | ORAL | Status: DC

## 2014-08-29 MED ORDER — RALTEGRAVIR POTASSIUM 400 MG PO TABS: 400.0000 mg | ORAL_TABLET | Freq: Two times a day (BID) | ORAL | Status: DC

## 2014-08-29 MED ORDER — FUROSEMIDE 40 MG PO TABS: 40.0000 mg | ORAL_TABLET | Freq: Every day | ORAL | Status: DC

## 2014-08-29 MED ORDER — EMTRICITABINE-TENOFOVIR DF 200-300 MG PO TABS: 1.0000 | ORAL_TABLET | Freq: Every day | ORAL | Status: DC

## 2014-08-29 MED ORDER — DIGOXIN 125 MCG PO TABS: 0.1250 mg | ORAL_TABLET | Freq: Every day | ORAL | Status: DC

## 2014-08-29 MED ORDER — NICOTINE 21 MG/24HR TD PT24: 21.0000 mg | MEDICATED_PATCH | Freq: Every day | TRANSDERMAL | Status: DC

## 2014-08-29 MED ORDER — POTASSIUM CHLORIDE CRYS ER 10 MEQ PO TBCR: 10.0000 meq | EXTENDED_RELEASE_TABLET | Freq: Every day | ORAL | Status: DC

## 2014-08-29 NOTE — ED Provider Notes (Signed)
Medical screening examination/treatment/procedure(s) were performed by non-physician practitioner and as supervising physician I was immediately available for consultation/collaboration.   EKG Interpretation None        Rehaan Viloria, MD 08/29/14 0908 

## 2014-08-29 NOTE — Progress Notes (Signed)
The following facilities have been contacted regarding bed availability for inptx:   [facility]- per [place] currently at capacity but will call if anything should change  [facility]- per Baird Lyons at [employer]- per [place] beds available but no SA [facility]- per Dorathy Daft at [facility]- per [employer] available but no primary SA [facility]- no primary SA [place]- per Bradd Burner can fax, referral faxed  [facility]- per [employer] available but no primary SA programming [facility]- per Rosey Bath at [employer]- per [place] at capacity  [facility]- per [place] at [employer]- per [place] at capacity and already have "an extensive wait list"  [facility]- per [place] at [employer]- per [place] at capacity  [facility]- per Dannielle Huh at [employer]- per [place], may have a few d/c's and can fax, referral faxed  [facility]- per Porfirio Mylar no longer accept referrals  [facility]- per Dahlia Client at capacity  [facility]- per De Hollingshead at [employer]- per [facility] accepting referrals, referral faxed     Tomi Bamberger Disposition MHT

## 2014-08-29 NOTE — Progress Notes (Signed)
Patient seen lying in bed, eyes closed. Patient observed frequenting the bathroom. No complaint. Will continue to monitor patient.
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
## 2014-08-29 NOTE — Progress Notes (Signed)
Nursing Shift Note Positive SI with plan to "jump off a bridge". Flat affect, slow movement, depressed mood. Denies SI, AVH, pain. Cooperative with staff. Healthy coping skills packet given. Medication compliant. Pt currently laying quietly in bed. Support and encouragement given. Will continue to monitor and stabilize.

## 2014-08-29 NOTE — Progress Notes (Signed)
D: Pt presents with appropriate affect, pleasant mood.  Pt denies SI, HI, auditory hallucinations, and visual hallucinations.  Pt reports "I feel suicidal sometimes, but not right now."  Pt interacts appropriately with staff. A: Encouraged and supported pt.  Medications administered per order.  Safety maintained. R: Pt is compliant with medications.  Pt verbally contracts for safety.  Pt is in no distress.  Will continue to monitor and assess for safety.

## 2014-08-30 DIAGNOSIS — F332 Major depressive disorder, recurrent severe without psychotic features: Secondary | ICD-10-CM

## 2014-08-30 DIAGNOSIS — R45851 Suicidal ideations: Secondary | ICD-10-CM

## 2014-08-30 NOTE — Progress Notes (Signed)
Pt stated to writer "I don't want to go to the bathroom all day". Pt informed medication Lasix was included during his medication administration. Pt irritable and agitated. Pt noted slamming bathroom door upon entering bathroom. Pt noted yelling in bathroom at peer attempting to go to bathroom, while peer was unaware bathroom was occupied.  Extender Dahlia Byes notified with no new orders and [facility] Tina notified.
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
## 2014-08-30 NOTE — Progress Notes (Signed)
F/u calls have been placed to the following facilities regarding pt's referral and bed availability for inptx: [facility]- per Trey Paula have one bed available and are still reviewing referrals.  Will call TTS back if pt appropriate for available bed. [facility]- per Maralyn Sago at [facility]- referral not reviewed yet  The following facilities have also been contacted and are currently at capacity: [facility]- per Kathaleen Bury- per Camille Bal- per Denice Bors- per Janey Greaser- per [facility]- per Ermalinda Barrios- per [facility]- per Tommy Rainwater- per [facility]- per [facility]- per Myrene Buddy [facility]- per [facility]- no longer accepting outside referrals [facility]- per [facility]- per [facility] Disposition MHT

## 2014-08-30 NOTE — Progress Notes (Signed)
Nursing Shift Note OBS Flat affect, assertive interaction, medication compliant. Denies SI, HI, AVH. Verbal agreement made with staff not to self harm. Support and encouragement given, safety maintained. No acute distress noted. Pt in a safe environment. Will continue to monitor and stabilize.

## 2014-08-31 DIAGNOSIS — F1020 Alcohol dependence, uncomplicated: Secondary | ICD-10-CM

## 2014-08-31 DIAGNOSIS — F1920 Other psychoactive substance dependence, uncomplicated: Secondary | ICD-10-CM

## 2014-08-31 MED ORDER — MULTI-VITAMINS PO TABS: 1.0000 | ORAL_TABLET | Freq: Every day | ORAL | Status: DC

## 2014-08-31 MED ORDER — FUROSEMIDE 40 MG PO TABS: 40.0000 mg | ORAL_TABLET | Freq: Every day | ORAL | Status: DC

## 2014-08-31 MED ORDER — NICOTINE 21 MG/24HR TD PT24: 21.0000 mg | MEDICATED_PATCH | Freq: Every day | TRANSDERMAL | Status: DC

## 2014-08-31 MED ORDER — ASPIRIN 81 MG PO CHEW: 81.0000 mg | CHEWABLE_TABLET | Freq: Every morning | ORAL | Status: DC

## 2014-08-31 MED ORDER — ISOSORBIDE DINITRATE 5 MG PO TABS: 5.0000 mg | ORAL_TABLET | Freq: Three times a day (TID) | ORAL | Status: DC

## 2014-08-31 MED ORDER — BUPROPION HCL ER (XL) 150 MG PO TB24: 150.0000 mg | ORAL_TABLET | Freq: Every day | ORAL | Status: DC

## 2014-08-31 MED ORDER — EMTRICITABINE-TENOFOVIR DF 200-300 MG PO TABS: 1.0000 | ORAL_TABLET | Freq: Every day | ORAL | Status: DC

## 2014-08-31 MED ORDER — SPIRONOLACTONE 25 MG PO TABS: 12.5000 mg | ORAL_TABLET | Freq: Every day | ORAL | Status: DC

## 2014-08-31 MED ORDER — CETIRIZINE HCL 10 MG PO TABS: 10.0000 mg | ORAL_TABLET | Freq: Every day | ORAL | Status: DC

## 2014-08-31 MED ORDER — POTASSIUM CHLORIDE CRYS ER 10 MEQ PO TBCR: 10.0000 meq | EXTENDED_RELEASE_TABLET | Freq: Every day | ORAL | Status: DC

## 2014-08-31 MED ORDER — RALTEGRAVIR POTASSIUM 400 MG PO TABS: 400.0000 mg | ORAL_TABLET | Freq: Two times a day (BID) | ORAL | Status: DC

## 2014-08-31 MED ORDER — DIGOXIN 125 MCG PO TABS: 125.0000 ug | ORAL_TABLET | Freq: Every day | ORAL | Status: DC

## 2014-08-31 MED ORDER — TRAZODONE HCL 100 MG PO TABS: 100.0000 mg | ORAL_TABLET | Freq: Every day | ORAL | Status: DC

## 2014-08-31 MED ORDER — METOPROLOL SUCCINATE ER 25 MG PO TB24: 25.0000 mg | ORAL_TABLET | Freq: Every day | ORAL | Status: DC

## 2014-08-31 MED ORDER — HYDROXYZINE HCL 25 MG PO TABS: 25.0000 mg | ORAL_TABLET | Freq: Three times a day (TID) | ORAL | Status: DC | PRN

## 2014-08-31 MED ORDER — RISPERIDONE 0.5 MG PO TABS: 0.5000 mg | ORAL_TABLET | Freq: Every day | ORAL | Status: DC

## 2014-08-31 MED ORDER — ALBUTEROL SULFATE HFA 108 (90 BASE) MCG/ACT IN AERS: 2.0000 | INHALATION_SPRAY | RESPIRATORY_TRACT | Status: DC | PRN

## 2014-08-31 MED ORDER — LISINOPRIL 2.5 MG PO TABS: 2.5000 mg | ORAL_TABLET | Freq: Every day | ORAL | Status: DC

## 2014-08-31 MED ORDER — VALACYCLOVIR HCL 500 MG PO TABS: 500.0000 mg | ORAL_TABLET | Freq: Two times a day (BID) | ORAL | Status: DC

## 2014-08-31 NOTE — Progress Notes (Signed)
Pt refused morning vitals

## 2014-08-31 NOTE — Discharge Instructions (Signed)
For your ongoing behavioral health needs, continue seeing your current outpatient providers, Dr Laural Benes, Dr Wilber Bihari, and Dot Lanes Meder.  You will be going from the Observation Unit at [facility] to the [facility] homeless shelter in [place].  To coordinator your ongoing care needs while in [place], contact [facility] at [phone] or [phone].  These numbers also provide crisis support 24 hours a day, 7 days a week.

## 2014-08-31 NOTE — BHH Suicide Risk Assessment (Signed)
Suicide Risk Assessment  Discharge Assessment     Demographic Factors:  Male  Total Time spent with patient: 20 minutes  Psychiatric Specialty Exam:     Blood pressure 98/54, pulse 64, temperature 98.5 F (36.9 C), temperature source Oral, resp. rate 17, height 5' 7.5" (1.715 m), weight 87.998 kg (194 lb), SpO2 100.00%.Body mass index is 29.92 kg/(m^2).  General Appearance: Fairly Groomed  Patent attorney::  Fair  Speech:  Clear and Coherent  Volume:  Normal  Mood:  Euthymic  Affect:  Appropriate  Thought Process:  Coherent and Goal Directed  Orientation:  Full (Time, Place, and Person)  Thought Content:  plans as he moves on  Suicidal Thoughts:  No  Homicidal Thoughts:  No  Memory:  Immediate;   Fair Recent;   Fair Remote;   Fair  Judgement:  Fair  Insight:  Shallow  Psychomotor Activity:  Normal  Concentration:  Fair  Recall:  [employer] of Knowledge:NA  Language: Fair  Akathisia:  No  Handed:    AIMS (if indicated):     Assets:  Desire for Improvement  Sleep:       Musculoskeletal: Strength & Muscle Tone: within normal limits Gait & Station: normal Patient leans: N/A   Mental Status Per Nursing Assessment::   On Admission:  Suicidal ideation indicated by patient;Self-harm thoughts  Current Mental Status by Physician: In full contact with reality. There are no active SI plans or intent. He is going to [facility] where he is going to pursue further outpatient treatment   Loss Factors: Decline in physical health and Financial problems/change in socioeconomic status  Historical Factors: NA  Risk Reduction Factors:   NA  Continued Clinical Symptoms:  Depression:   Comorbid alcohol abuse/dependence Alcohol/Substance Abuse/Dependencies  Cognitive Features That Contribute To Risk:  Polarized thinking Thought constriction (tunnel vision)    Suicide Risk:  Minimal: No identifiable suicidal ideation.  Patients presenting with no risk factors but with morbid  ruminations; may be classified as minimal risk based on the severity of the depressive symptoms  Discharge Diagnoses:   AXIS I:  Alcohol Dependence, Cocaine abuse, substance induced mood disorder AXIS II:  No diagnosis AXIS III:   Past Medical History  Diagnosis Date  . HIV (human immunodeficiency virus infection)   . Chronic systolic heart failure   . Hypertension   . Genital herpes   . Active smoker   . AR (allergic rhinitis)   . HLD (hyperlipidemia)   . NICM (nonischemic cardiomyopathy)     Echocardiogram 06/28/11: EF 30-35%, mild MR, mild LAE;  No CAD by coronary CT angiogram 3/12 at [facility]  . NSVT (nonsustained ventricular tachycardia)   . Depression   . Bipolar 1 disorder   . Alcohol abuse   . Crack cocaine use   . COPD (chronic obstructive pulmonary disease)   . CHF (congestive heart failure)   . Anxiety   . PTSD (post-traumatic stress disorder)    AXIS IV:  other psychosocial or environmental problems AXIS V:  61-70 mild symptoms  Plan Of Care/Follow-up recommendations:  Activity:  as tolerated Diet:  heart healthy Follow up outpatient basis Is patient on multiple antipsychotic therapies at discharge:  No   Has Patient had three or more failed trials of antipsychotic monotherapy by history:  No  Recommended Plan for Multiple Antipsychotic Therapies: NA    Voncile Schwarz A 08/31/2014, 1:30 PM

## 2014-08-31 NOTE — Progress Notes (Signed)
Per Dr. Lucianne Muss the patient will be discharged to a homeless shelter with outpatient resources.   Per Cheron Schaumann at [facility] in [place], [place] the patient is not allowed to come back to capacity.   Per Seward Grater at [facility] there facility is at capacity.  [facility] left a voice mail message  Per Shanda Howells at [facility] the patient is able to come to their facility for an intake.  The patient will meet with (Intake Coordinator)Thadius.    TTS will provide the patient with a bus pass or taxi voucher for the patient to be transported to [facility] located ([address] [place] [address])  The phone number is [phone].   Writer informed the TTS Elijah Birk).  Writer informed the [facility] Inetta Fermo).  Inetta Fermo [facility]) will follow up with the NP (Aggie) so that the patient will have his discharge information placed into epic.

## 2014-08-31 NOTE — Discharge Summary (Signed)
Patient seen and evaluated my me. Patient contracts for safety but insists on [place] finding housing for him. Discussed in length that we could make referrals to shelters but he would have to follow up

## 2014-08-31 NOTE — Plan of Care (Signed)
BHH Observation Crisis Plan  Reason for Crisis Plan:  Crisis Stabilization   Plan of Care:  Referral to current outpatient providers  Family Support:    None  Current Living Environment:  Living Arrangements: Alone; pt has been living at the [facility] in [place], but is not eligible to return.  Insurance:  Self Pay Hospital Account   Name Acct ID Class Status Primary Coverage   Ryan Long, Ryan Long [id number] BEHAVIORAL HEALTH OBSERVATION Open None        Guarantor Account (for Hospital Account [id number])   Name Relation to Pt Service Area Active? Acct Type   [name] Self [facility] Yes Behavioral Health   Address Phone       [address] [place], [place] [address] [phone](H)          Coverage Information (for Hospital Account [id number])   Not on file      Legal Guardian:   Self  Primary Care Provider:  PROVIDER NOT IN SYSTEM; Staci Righter, MD; Dr Janalyn Shy  Current Outpatient Providers:  [facility]  Psychiatrist:   Dr Laural [facility]es at [facility]  Counselor/Therapist:   Dr Wilber Bihari at [facility]; pt also has a case manager, Rivkah Meder  Compliant with Medications:  Yes  Additional Information: After consulting with Nelly Rout, MD it has been determined that pt does not present a life threatening danger to himself or others, and that psychiatric hospitalization is not indicated for him at this time.  He will continue to need outpatient psychiatry and counseling.  He has been seeing Dr Laural Benes at [facility] in [place] for psychiatry, as well as Dr Wilber Bihari at [facility] for counseling.  He also sees IT trainer for case management; she has been addressing his ongoing housing needs.  Pt has been living at the [facility] in [place], where all of his personal belongings, including medications for HIV and other medical problems, are currently located; this has been confirmed.  He lost standing with  [employer] on 08/26/2014, however, when he did not show up, and he will not be eligible to return for 30 days.  Pt needs to return there nonetheless to take possession of his belongings.  From there arrangements have reportedly been made for pt to go to the [facility] in [place].  Pt is agreeable to this.  He has been provided with a taxi voucher for transport to [place], a total of $3.50 for transport to [facility] by bus, and a Nurse, mental health for transport to [facility].  Pt has been advised to remain in contact with his current outpatient providers.  However, in case he needs services while in [place] that they are unable to provide, he has been given contact information for [facility] to coordinate his treatment needs.  Doylene Canning, MA Triage Specialist Kizzie Bane Harriette Bouillon 9/14/20151:52 PM
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
## 2014-08-31 NOTE — Progress Notes (Signed)
  D:Pt observed sleeping in bed with eyes closed. RR even and unlabored. No distress noted.Pt used the bathroom at 0200  A:  observation continues for safety  R: pt remains safe

## 2014-08-31 NOTE — Progress Notes (Signed)
Dr. Dub Mikes in to speak with patient.

## 2014-09-01 ENCOUNTER — Encounter (HOSPITAL_COMMUNITY): Payer: Self-pay | Admitting: Emergency Medicine

## 2014-09-01 ENCOUNTER — Emergency Department (HOSPITAL_COMMUNITY)
Admission: EM | Admit: 2014-09-01 | Discharge: 2014-09-01 | Disposition: A | Discharge status: Home / Self Care | Payer: Medicaid Other | Attending: Emergency Medicine | Admitting: Emergency Medicine

## 2014-09-01 DIAGNOSIS — Z7982 Long term (current) use of aspirin: Secondary | ICD-10-CM | POA: Insufficient documentation

## 2014-09-01 DIAGNOSIS — I5022 Chronic systolic (congestive) heart failure: Secondary | ICD-10-CM | POA: Diagnosis not present

## 2014-09-01 DIAGNOSIS — J449 Chronic obstructive pulmonary disease, unspecified: Secondary | ICD-10-CM | POA: Insufficient documentation

## 2014-09-01 DIAGNOSIS — F17200 Nicotine dependence, unspecified, uncomplicated: Secondary | ICD-10-CM | POA: Insufficient documentation

## 2014-09-01 DIAGNOSIS — Z8619 Personal history of other infectious and parasitic diseases: Secondary | ICD-10-CM | POA: Diagnosis not present

## 2014-09-01 DIAGNOSIS — Z862 Personal history of diseases of the blood and blood-forming organs and certain disorders involving the immune mechanism: Secondary | ICD-10-CM | POA: Diagnosis not present

## 2014-09-01 DIAGNOSIS — I4729 Other ventricular tachycardia: Secondary | ICD-10-CM | POA: Insufficient documentation

## 2014-09-01 DIAGNOSIS — Z79899 Other long term (current) drug therapy: Secondary | ICD-10-CM | POA: Diagnosis not present

## 2014-09-01 DIAGNOSIS — Z21 Asymptomatic human immunodeficiency virus [HIV] infection status: Secondary | ICD-10-CM | POA: Insufficient documentation

## 2014-09-01 DIAGNOSIS — F411 Generalized anxiety disorder: Secondary | ICD-10-CM | POA: Insufficient documentation

## 2014-09-01 DIAGNOSIS — I472 Ventricular tachycardia, unspecified: Secondary | ICD-10-CM | POA: Insufficient documentation

## 2014-09-01 DIAGNOSIS — Z202 Contact with and (suspected) exposure to infections with a predominantly sexual mode of transmission: Secondary | ICD-10-CM

## 2014-09-01 DIAGNOSIS — Z8639 Personal history of other endocrine, nutritional and metabolic disease: Secondary | ICD-10-CM | POA: Insufficient documentation

## 2014-09-01 DIAGNOSIS — I10 Essential (primary) hypertension: Secondary | ICD-10-CM | POA: Insufficient documentation

## 2014-09-01 DIAGNOSIS — F319 Bipolar disorder, unspecified: Secondary | ICD-10-CM | POA: Diagnosis not present

## 2014-09-01 DIAGNOSIS — J4489 Other specified chronic obstructive pulmonary disease: Secondary | ICD-10-CM | POA: Insufficient documentation

## 2014-09-01 LAB — URINALYSIS, ROUTINE W REFLEX MICROSCOPIC
Bilirubin Urine: NEGATIVE
Glucose, UA: NEGATIVE mg/dL
Hgb urine dipstick: NEGATIVE
Ketones, ur: NEGATIVE mg/dL
LEUKOCYTES UA: NEGATIVE
NITRITE: NEGATIVE
PROTEIN: NEGATIVE mg/dL
Specific Gravity, Urine: 1.014 (ref 1.005–1.030)
UROBILINOGEN UA: 0.2 mg/dL (ref 0.0–1.0)
pH: 5 (ref 5.0–8.0)

## 2014-09-01 MED ORDER — CEFTRIAXONE SODIUM 250 MG IJ SOLR
250.0000 mg | INTRAMUSCULAR | Status: DC
  Administered 2014-09-01: 250 mg via INTRAMUSCULAR
  Filled 2014-09-01: qty 250

## 2014-09-01 MED ORDER — AZITHROMYCIN 250 MG PO TABS
1000.0000 mg | ORAL_TABLET | Freq: Once | ORAL | Status: AC
  Administered 2014-09-01: 1000 mg via ORAL
  Filled 2014-09-01: qty 4

## 2014-09-01 MED ORDER — AZITHROMYCIN 250 MG PO TABS: 1000.0000 mg | ORAL_TABLET | Freq: Once | ORAL | Status: DC

## 2014-09-01 MED ORDER — DOXYCYCLINE HYCLATE 100 MG PO CAPS: 100.0000 mg | ORAL_CAPSULE | Freq: Two times a day (BID) | ORAL | Status: DC

## 2014-09-01 MED ORDER — LIDOCAINE HCL (PF) 1 % IJ SOLN
5.0000 mL | Freq: Once | INTRAMUSCULAR | Status: AC
  Administered 2014-09-01: 5 mL
  Filled 2014-09-01: qty 5

## 2014-09-01 NOTE — ED Notes (Signed)
Declined W/C at D/C and was escorted to lobby by RN. 

## 2014-09-01 NOTE — Discharge Instructions (Signed)
Pelvic Infection ° °If you have been diagnosed with a pelvic infection such as a sexually transmitted disease, you will need to be treated with antibiotics. Please take the medicines as prescribed. Some of these tests do not come back for 1-2 days in which case if they turn positive you will receive a phone call to let you know. If you are contacted and do have an infection consistent with a sexually transmitted disease, then you will need to tell any and all sexual partners that you have had in the last 6 months no so that they can be tested and treated as well. If you should develop severe or worsening pain in your abdomen or the pelvis or develop severe fevers,nausea or vomiting that prevent you from taking your medications, return to the emergency department immediately. Otherwise contact your local physician or county health department for a follow up appointment to complete STD testing including HIV and syphilis.  See the list of phone numbers below. ° °RESOURCE GUIDE ° °Dental Problems ° °Patients with Medicaid: °[facility]                     [facility] °[address]                                           [address] °Phone:  [phone]                                                  Phone:  [phone] ° °If unable to pay or uninsured, contact:  [facility] or [facility] to become qualified for the adult dental clinic. ° °Chronic Pain Problems °Contact [facility]  [phone] °Patients need to be referred by their primary care doctor. ° °Insufficient Money for Medicine °Contact [employer]:  call "211" or [facility] 271-5999. ° °No Primary Care Doctor °Call [facility]  832-8000 °Other agencies that provide inexpensive medical care °   [facility]  832-8035 °   [facility]  832-7272 °   [facility]  271-5999 °   Women[facility] Clinic  832-4777 °   [facility]  373-0678 ° [facility]  [phone] ° °Psychological Services °[facility]  [phone] °[facility]  378-7881 °[facility]   [phone] (emergency services 641-4993) ° °Substance Abuse Resources °[facility]  336-882-2125 °[facility] 336-784-9470 °[facility] 336-285-9073 °[facility] 336-845-3988 °Residential & Outpatient Substance Abuse Program  800-659-3381 ° °Abuse/Neglect °[place] [facility] (336) 641-3795 °[place] [facility] 800-378-5315 (After Hours) ° °Emergency Shelter °[facility] (336) 271-5985 ° °Maternity Homes °[facility] (336) 275-9566 °[facility] (704) 372-4663 ° °MRSA Hotline #:   832-7006 ° ° ° °[place] [facility] ° °[facility] [place] [facility]     [employer]                          [facility] °[address] [place]                         [address]      [address]  °[place]                                                [place]                            [place] °Phone:  349-3220                                   Phone:  [phone]                 Phone:  342-8140 ° °[facility] °Phone:  342-8316 ° °[place] [facility] Hotline °(336) 342-1394 °(336) 342-3537 (After Hours) ° ° ° °

## 2014-09-01 NOTE — ED Notes (Signed)
The pot ha shad a penis discharge  Today with paijnful urination.  His sexaul partner called and told him n he needed to get checked

## 2014-09-01 NOTE — ED Provider Notes (Signed)
CSN: [id number]     Arrival date & time 09/01/14  0022 History   First MD Initiated Contact with Patient 09/01/14 0053     Chief Complaint  Patient presents with  . Exposure to STD     (Consider location/radiation/quality/duration/timing/severity/associated sxs/prior Treatment) HPI Comments: Pt with new onset clear d/c from penis, no f/c/n/v and no abd pain GF with hx of STD per pt  Patient is a 53 y.o. male presenting with STD exposure. The history is provided by the patient.  Exposure to STD    Past Medical History  Diagnosis Date  . HIV (human immunodeficiency virus infection)   . Chronic systolic heart failure   . Hypertension   . Genital herpes   . Active smoker   . AR (allergic rhinitis)   . HLD (hyperlipidemia)   . NICM (nonischemic cardiomyopathy)     Echocardiogram 06/28/11: EF 30-35%, mild MR, mild LAE;  No CAD by coronary CT angiogram 3/12 at [facility]  . NSVT (nonsustained ventricular tachycardia)   . Depression   . Bipolar 1 disorder   . Alcohol abuse   . Crack cocaine use   . COPD (chronic obstructive pulmonary disease)   . CHF (congestive heart failure)   . Anxiety   . PTSD (post-traumatic stress disorder)    History reviewed. No pertinent past surgical history. Family History  Problem Relation Age of Onset  . Hypertension Father   . CAD Father    History  Substance Use Topics  . Smoking status: Current Every Day Smoker -- 1.00 packs/day for 30 years    Types: Cigarettes  . Smokeless tobacco: Never Used     Comment: 15 ciggerettes daily  . Alcohol Use: Yes     Comment: sporadic; approximately every 2 -3 weeks    Review of Systems  Constitutional: Negative for fever.  Genitourinary: Positive for discharge. Negative for genital sores and penile pain.      Allergies  Review of patient's allergies indicates no known allergies.  Home Medications   Prior to Admission medications   Medication Sig Start Date End Date Taking?  Authorizing Provider  albuterol (PROVENTIL HFA;VENTOLIN HFA) 108 (90 BASE) MCG/ACT inhaler Inhale 2 puffs into the lungs every 4 (four) hours as needed for shortness of breath. 08/31/14   Sanjuana Kava, NP  aspirin 81 MG chewable tablet Chew 1 tablet (81 mg total) by mouth every morning. For heart health 08/31/14   Sanjuana Kava, NP  azithromycin (ZITHROMAX) 250 MG tablet Take 4 tablets (1,000 mg total) by mouth once. Take first 2 tablets together, then 1 every day until finished. 09/01/14   [name], MD  buPROPion (WELLBUTRIN XL) 150 MG 24 hr tablet Take 1 tablet (150 mg total) by mouth daily. For depression 08/31/14   Sanjuana Kava, NP  cetirizine (ZYRTEC) 10 MG tablet Take 1 tablet (10 mg total) by mouth daily. For allergies 08/31/14   Sanjuana Kava, NP  digoxin (LANOXIN) 0.125 MG tablet Take 1 tablet (125 mcg total) by mouth daily. For control of heart arrythmia. 08/31/14   Sanjuana Kava, NP  doxycycline (VIBRAMYCIN) 100 MG capsule Take 1 capsule (100 mg total) by mouth 2 (two) times daily. 09/01/14   [name], MD  emtricitabine-tenofovir (TRUVADA) 200-300 MG per tablet Take 1 tablet by mouth daily. For HIV infection 08/31/14   Sanjuana Kava, NP  furosemide (LASIX) 40 MG tablet Take 1 tablet (40 mg total) by mouth daily. For  edema 08/31/14   Sanjuana Kava, NP  hydrOXYzine (ATARAX/VISTARIL) 25 MG tablet Take 1-2 tablets (25-50 mg total) by mouth 3 (three) times daily as needed for anxiety. 08/31/14   Sanjuana Kava, NP  isosorbide dinitrate (ISORDIL) 5 MG tablet Take 1 tablet (5 mg total) by mouth 3 (three) times daily. For heart condition 08/31/14   Sanjuana Kava, NP  lisinopril (PRINIVIL,ZESTRIL) 2.5 MG tablet Take 1 tablet (2.5 mg total) by mouth daily. For high blood pressure 08/31/14   Sanjuana Kava, NP  metoprolol succinate (TOPROL-XL) 25 MG 24 hr tablet Take 1 tablet (25 mg total) by mouth daily. For high blood pressure 08/31/14   Sanjuana Kava, NP  Multiple Vitamin (MULTI-VITAMINS) TABS Take 1  tablet by mouth daily. For low vitamin 08/31/14   Sanjuana Kava, NP  nicotine (RA NICOTINE) 21 mg/24hr patch Place 1 patch (21 mg total) onto the skin daily. For nicotine addiction 08/31/14   Sanjuana Kava, NP  potassium chloride (K-DUR,KLOR-CON) 10 MEQ tablet Take 1 tablet (10 mEq total) by mouth daily. For low potassium 08/31/14   Sanjuana Kava, NP  raltegravir (ISENTRESS) 400 MG tablet Take 1 tablet (400 mg total) by mouth 2 (two) times daily. For HIV infection 08/31/14   Sanjuana Kava, NP  risperiDONE (RISPERDAL) 0.5 MG tablet Take 1 tablet (0.5 mg total) by mouth at bedtime. For mood control 08/31/14   Sanjuana Kava, NP  spironolactone (ALDACTONE) 25 MG tablet Take 0.5 tablets (12.5 mg total) by mouth daily. For heart condition 08/31/14   Sanjuana Kava, NP  traZODone (DESYREL) 100 MG tablet Take 1 tablet (100 mg total) by mouth at bedtime. For sleep 08/31/14   Sanjuana Kava, NP  valACYclovir (VALTREX) 500 MG tablet Take 1 tablet (500 mg total) by mouth 2 (two) times daily. For Herpes. 08/31/14   Sanjuana Kava, NP   BP 109/69  Pulse 86  Temp(Src) 97.7 F (36.5 C)  Resp 16  Ht 5\' 9"  (1.753 m)  Wt 195 lb (88.451 kg)  BMI 28.78 kg/m2  SpO2 100% Physical Exam  Nursing note and vitals reviewed. Constitutional: He appears well-developed and well-nourished. No distress.  HENT:  Head: Normocephalic and atraumatic.  Mouth/Throat: Oropharynx is clear and moist.  Eyes: Conjunctivae are normal. Right eye exhibits no discharge. Left eye exhibits no discharge. No scleral icterus.  Pulmonary/Chest: Effort normal. No respiratory distress.  Genitourinary:  Normal appearing penis, no d/c  Neurological: He is alert. Coordination normal.  Skin: Skin is warm and dry. No rash noted. He is not diaphoretic.    ED Course  Procedures (including critical care time) Labs Review Labs Reviewed  URINALYSIS, ROUTINE W REFLEX MICROSCOPIC - Abnormal; Notable for the following:    APPearance CLOUDY (*)    All other  components within normal limits  GC/CHLAMYDIA PROBE AMP    Imaging Review No results found.    MDM   Final diagnoses:  STD exposure    R/o STD, swab done by me, meds and home, well appearing, no mental health complaint tonioght.  Meds given in ED:  Medications  cefTRIAXone (ROCEPHIN) injection 250 mg (250 mg Intramuscular Given 09/01/14 0120)  lidocaine (PF) (XYLOCAINE) 1 % injection 5 mL (5 mLs Other Given 09/01/14 0120)    New Prescriptions   AZITHROMYCIN (ZITHROMAX) 250 MG TABLET    Take 4 tablets (1,000 mg total) by mouth once. Take first 2 tablets together, then 1 every day until finished.  DOXYCYCLINE (VIBRAMYCIN) 100 MG CAPSULE    Take 1 capsule (100 mg total) by mouth 2 (two) times daily.        [name], MD 09/01/14 0130

## 2014-09-02 LAB — GC/CHLAMYDIA PROBE AMP
CT Probe RNA: NEGATIVE
GC Probe RNA: NEGATIVE

## 2014-09-16 ENCOUNTER — Inpatient Hospital Stay (HOSPITAL_COMMUNITY): Admission: RE | Admit: 2014-09-16 | Payer: Self-pay | Source: Ambulatory Visit
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
## 2014-09-28 NOTE — H&P (Signed)
I agree with the findings and assessement.

## 2014-12-10 ENCOUNTER — Encounter (HOSPITAL_COMMUNITY): Payer: Self-pay | Admitting: Emergency Medicine

## 2014-12-10 ENCOUNTER — Emergency Department (HOSPITAL_COMMUNITY)
Admission: EM | Admit: 2014-12-10 | Discharge: 2014-12-10 | Disposition: A | Discharge status: Home / Self Care | Payer: Medicaid Other | Attending: Emergency Medicine | Admitting: Emergency Medicine

## 2014-12-10 DIAGNOSIS — F419 Anxiety disorder, unspecified: Secondary | ICD-10-CM | POA: Insufficient documentation

## 2014-12-10 DIAGNOSIS — Z8619 Personal history of other infectious and parasitic diseases: Secondary | ICD-10-CM | POA: Insufficient documentation

## 2014-12-10 DIAGNOSIS — I10 Essential (primary) hypertension: Secondary | ICD-10-CM | POA: Diagnosis not present

## 2014-12-10 DIAGNOSIS — I5022 Chronic systolic (congestive) heart failure: Secondary | ICD-10-CM | POA: Insufficient documentation

## 2014-12-10 DIAGNOSIS — Z202 Contact with and (suspected) exposure to infections with a predominantly sexual mode of transmission: Secondary | ICD-10-CM

## 2014-12-10 DIAGNOSIS — F319 Bipolar disorder, unspecified: Secondary | ICD-10-CM | POA: Diagnosis not present

## 2014-12-10 DIAGNOSIS — J449 Chronic obstructive pulmonary disease, unspecified: Secondary | ICD-10-CM | POA: Insufficient documentation

## 2014-12-10 DIAGNOSIS — Z8639 Personal history of other endocrine, nutritional and metabolic disease: Secondary | ICD-10-CM | POA: Diagnosis not present

## 2014-12-10 DIAGNOSIS — Z792 Long term (current) use of antibiotics: Secondary | ICD-10-CM | POA: Diagnosis not present

## 2014-12-10 DIAGNOSIS — Z79899 Other long term (current) drug therapy: Secondary | ICD-10-CM | POA: Insufficient documentation

## 2014-12-10 DIAGNOSIS — Z21 Asymptomatic human immunodeficiency virus [HIV] infection status: Secondary | ICD-10-CM | POA: Insufficient documentation

## 2014-12-10 DIAGNOSIS — Z720 Tobacco use: Secondary | ICD-10-CM | POA: Diagnosis not present

## 2014-12-10 DIAGNOSIS — F4310 Post-traumatic stress disorder, unspecified: Secondary | ICD-10-CM | POA: Diagnosis not present

## 2014-12-10 DIAGNOSIS — Z7982 Long term (current) use of aspirin: Secondary | ICD-10-CM | POA: Diagnosis not present

## 2014-12-10 LAB — URINALYSIS, ROUTINE W REFLEX MICROSCOPIC
Bilirubin Urine: NEGATIVE
Glucose, UA: NEGATIVE mg/dL
Ketones, ur: NEGATIVE mg/dL
Leukocytes, UA: NEGATIVE
Nitrite: NEGATIVE
Protein, ur: NEGATIVE mg/dL
Specific Gravity, Urine: 1.014 (ref 1.005–1.030)
Urobilinogen, UA: 1 mg/dL (ref 0.0–1.0)
pH: 5.5 (ref 5.0–8.0)

## 2014-12-10 LAB — URINE MICROSCOPIC-ADD ON

## 2014-12-10 MED ORDER — AZITHROMYCIN 250 MG PO TABS
1000.0000 mg | ORAL_TABLET | Freq: Once | ORAL | Status: AC
  Administered 2014-12-10: 1000 mg via ORAL
  Filled 2014-12-10: qty 4

## 2014-12-10 MED ORDER — CEFTRIAXONE SODIUM 250 MG IJ SOLR
250.0000 mg | Freq: Once | INTRAMUSCULAR | Status: AC
  Administered 2014-12-10: 250 mg via INTRAMUSCULAR
  Filled 2014-12-10: qty 250

## 2014-12-10 MED ORDER — LIDOCAINE HCL (PF) 1 % IJ SOLN
INTRAMUSCULAR | Status: AC
  Administered 2014-12-10: 1 mL
  Filled 2014-12-10: qty 5

## 2014-12-10 NOTE — ED Provider Notes (Signed)
CSN: [id number]     Arrival date & time 12/10/14  2353 History   First MD Initiated Contact with Patient 12/10/14 [phone]     Chief Complaint  Patient presents with  . SEXUALLY TRANSMITTED DISEASE     (Consider location/radiation/quality/duration/timing/severity/associated sxs/prior Treatment) HPI Comments: Pt comes in today with complaint of std exposure. Pt states that he is not sure which one but his partner told him yesterday. Pt states that he has been having some discharge and burning with urination for the last 4 days. Denies history of similar symptoms  The history is provided by the patient. No language interpreter was used.    Past Medical History  Diagnosis Date  . HIV (human immunodeficiency virus infection)   . Chronic systolic heart failure   . Hypertension   . Genital herpes   . Active smoker   . AR (allergic rhinitis)   . HLD (hyperlipidemia)   . NICM (nonischemic cardiomyopathy)     Echocardiogram 06/28/11: EF 30-35%, mild MR, mild LAE;  No CAD by coronary CT angiogram 3/12 at [facility]  . NSVT (nonsustained ventricular tachycardia)   . Depression   . Bipolar 1 disorder   . Alcohol abuse   . Crack cocaine use   . COPD (chronic obstructive pulmonary disease)   . CHF (congestive heart failure)   . Anxiety   . PTSD (post-traumatic stress disorder)    History reviewed. No pertinent past surgical history. Family History  Problem Relation Age of Onset  . Hypertension Father   . CAD Father    History  Substance Use Topics  . Smoking status: Current Every Day Smoker -- 1.00 packs/day for 30 years    Types: Cigarettes  . Smokeless tobacco: Never Used     Comment: 15 ciggerettes daily  . Alcohol Use: Yes     Comment: sporadic; approximately every 2 -3 weeks    Review of Systems  Constitutional: Negative.   Respiratory: Negative.   Cardiovascular: Negative.       Allergies  Review of patient's allergies indicates no known  allergies.  Home Medications   Prior to Admission medications   Medication Sig Start Date End Date Taking? Authorizing Provider  albuterol (PROVENTIL HFA;VENTOLIN HFA) 108 (90 BASE) MCG/ACT inhaler Inhale 2 puffs into the lungs every 4 (four) hours as needed for shortness of breath. 08/31/14   Sanjuana Kava, NP  aspirin 81 MG chewable tablet Chew 1 tablet (81 mg total) by mouth every morning. For heart health 08/31/14   Sanjuana Kava, NP  azithromycin (ZITHROMAX) 250 MG tablet Take 4 tablets (1,000 mg total) by mouth once. Take first 2 tablets together, then 1 every day until finished. 09/01/14   [name], MD  buPROPion (WELLBUTRIN XL) 150 MG 24 hr tablet Take 1 tablet (150 mg total) by mouth daily. For depression 08/31/14   Sanjuana Kava, NP  cetirizine (ZYRTEC) 10 MG tablet Take 1 tablet (10 mg total) by mouth daily. For allergies 08/31/14   Sanjuana Kava, NP  digoxin (LANOXIN) 0.125 MG tablet Take 1 tablet (125 mcg total) by mouth daily. For control of heart arrythmia. 08/31/14   Sanjuana Kava, NP  doxycycline (VIBRAMYCIN) 100 MG capsule Take 1 capsule (100 mg total) by mouth 2 (two) times daily. 09/01/14   [name], MD  emtricitabine-tenofovir (TRUVADA) 200-300 MG per tablet Take 1 tablet by mouth daily. For HIV infection 08/31/14   Sanjuana Kava, NP  furosemide (LASIX) 40  MG tablet Take 1 tablet (40 mg total) by mouth daily. For edema 08/31/14   Sanjuana KavaAgnes I Nwoko, NP  hydrOXYzine (ATARAX/VISTARIL) 25 MG tablet Take 1-2 tablets (25-50 mg total) by mouth 3 (three) times daily as needed for anxiety. 08/31/14   Sanjuana KavaAgnes I Nwoko, NP  isosorbide dinitrate (ISORDIL) 5 MG tablet Take 1 tablet (5 mg total) by mouth 3 (three) times daily. For heart condition 08/31/14   Sanjuana KavaAgnes I Nwoko, NP  lisinopril (PRINIVIL,ZESTRIL) 2.5 MG tablet Take 1 tablet (2.5 mg total) by mouth daily. For high blood pressure 08/31/14   Sanjuana KavaAgnes I Nwoko, NP  metoprolol succinate (TOPROL-XL) 25 MG 24 hr tablet Take 1 tablet (25 mg total) by  mouth daily. For high blood pressure 08/31/14   Sanjuana KavaAgnes I Nwoko, NP  Multiple Vitamin (MULTI-VITAMINS) TABS Take 1 tablet by mouth daily. For low vitamin 08/31/14   Sanjuana KavaAgnes I Nwoko, NP  nicotine (RA NICOTINE) 21 mg/24hr patch Place 1 patch (21 mg total) onto the skin daily. For nicotine addiction 08/31/14   Sanjuana KavaAgnes I Nwoko, NP  potassium chloride (K-DUR,KLOR-CON) 10 MEQ tablet Take 1 tablet (10 mEq total) by mouth daily. For low potassium 08/31/14   Sanjuana KavaAgnes I Nwoko, NP  raltegravir (ISENTRESS) 400 MG tablet Take 1 tablet (400 mg total) by mouth 2 (two) times daily. For HIV infection 08/31/14   Sanjuana KavaAgnes I Nwoko, NP  risperiDONE (RISPERDAL) 0.5 MG tablet Take 1 tablet (0.5 mg total) by mouth at bedtime. For mood control 08/31/14   Sanjuana KavaAgnes I Nwoko, NP  spironolactone (ALDACTONE) 25 MG tablet Take 0.5 tablets (12.5 mg total) by mouth daily. For heart condition 08/31/14   Sanjuana KavaAgnes I Nwoko, NP  traZODone (DESYREL) 100 MG tablet Take 1 tablet (100 mg total) by mouth at bedtime. For sleep 08/31/14   Sanjuana KavaAgnes I Nwoko, NP  valACYclovir (VALTREX) 500 MG tablet Take 1 tablet (500 mg total) by mouth 2 (two) times daily. For Herpes. 08/31/14   Sanjuana KavaAgnes I Nwoko, NP   BP 110/77 mmHg  Pulse 114  Temp(Src) 98 F (36.7 C) (Oral)  Resp 20  Ht 5\' 9"  (1.753 m)  Wt 195 lb (88.451 kg)  BMI 28.78 kg/m2  SpO2 96% Physical Exam  Constitutional: He is oriented to person, place, and time. He appears well-developed and well-nourished.  Cardiovascular: Normal rate and regular rhythm.   Pulmonary/Chest: Effort normal and breath sounds normal.  Abdominal: Soft. Bowel sounds are normal. There is no tenderness.  Musculoskeletal: Normal range of motion.  Neurological: He is alert and oriented to person, place, and time. Coordination normal.  Skin: Skin is warm and dry.  Nursing note and vitals reviewed.   ED Course  Procedures (including critical care time) Labs Review Labs Reviewed  URINALYSIS, ROUTINE W REFLEX MICROSCOPIC - Abnormal; Notable for  the following:    Hgb urine dipstick TRACE (*)    All other components within normal limits  GC/CHLAMYDIA PROBE AMP  URINE MICROSCOPIC-ADD ON  HIV ANTIBODY (ROUTINE TESTING)  RPR    Imaging Review No results found.   EKG Interpretation None      MDM   Final diagnoses:  STD exposure    No discharge noted. Pt treated with zithromax and rocephin. Discussed safe sex practices    Teressa LowerVrinda Teresha Hanks, NP 12/10/14 [id number]  Purvis SheffieldForrest Harrison, MD 12/10/14 2031

## 2014-12-10 NOTE — ED Notes (Signed)
Pt verbalized understanding of discharge instructions.

## 2014-12-10 NOTE — Discharge Instructions (Signed)
Sexually Transmitted Disease A sexually transmitted disease (STD) is a disease or infection that may be passed (transmitted) from person to person, usually during sexual activity. This may happen by way of saliva, semen, blood, vaginal mucus, or urine. Common STDs include:   Gonorrhea.   Chlamydia.   Syphilis.   HIV and AIDS.   Genital herpes.   Hepatitis B and C.   Trichomonas.   Human papillomavirus (HPV).   Pubic lice.   Scabies.  Mites.  Bacterial vaginosis. WHAT ARE CAUSES OF STDs? An STD may be caused by bacteria, a virus, or parasites. STDs are often transmitted during sexual activity if one person is infected. However, they may also be transmitted through nonsexual means. STDs may be transmitted after:   Sexual intercourse with an infected person.   Sharing sex toys with an infected person.   Sharing needles with an infected person or using unclean piercing or tattoo needles.  Having intimate contact with the genitals, mouth, or rectal areas of an infected person.   Exposure to infected fluids during birth. WHAT ARE THE SIGNS AND SYMPTOMS OF STDs? Different STDs have different symptoms. Some people may not have any symptoms. If symptoms are present, they may include:   Painful or bloody urination.   Pain in the pelvis, abdomen, vagina, anus, throat, or eyes.   A skin rash, itching, or irritation.  Growths, ulcerations, blisters, or sores in the genital and anal areas.  Abnormal vaginal discharge with or without bad odor.   Penile discharge in men.   Fever.   Pain or bleeding during sexual intercourse.   Swollen glands in the groin area.   Yellow skin and eyes (jaundice). This is seen with hepatitis.   Swollen testicles.  Infertility.  Sores and blisters in the mouth. HOW ARE STDs DIAGNOSED? To make a diagnosis, your health care provider may:   Take a medical history.   Perform a physical exam.   Take a sample of  any discharge to examine.  Swab the throat, cervix, opening to the penis, rectum, or vagina for testing.  Test a sample of your first morning urine.   Perform blood tests.   Perform a Pap test, if this applies.   Perform a colposcopy.   Perform a laparoscopy.  HOW ARE STDs TREATED? Treatment depends on the STD. Some STDs may be treated but not cured.   Chlamydia, gonorrhea, trichomonas, and syphilis can be cured with antibiotic medicine.   Genital herpes, hepatitis, and HIV can be treated, but not cured, with prescribed medicines. The medicines lessen symptoms.   Genital warts from HPV can be treated with medicine or by freezing, burning (electrocautery), or surgery. Warts may come back.   HPV cannot be cured with medicine or surgery. However, abnormal areas may be removed from the cervix, vagina, or vulva.   If your diagnosis is confirmed, your recent sexual partners need treatment. This is true even if they are symptom-free or have a negative culture or evaluation. They should not have sex until their health care providers say it is okay. HOW CAN I REDUCE MY RISK OF GETTING AN STD? Take these steps to reduce your risk of getting an STD:  Use latex condoms, dental dams, and water-soluble lubricants during sexual activity. Do not use petroleum jelly or oils.  Avoid having multiple sex partners.  Do not have sex with someone who has other sex partners.  Do not have sex with anyone you do not know or who is at   high risk for an STD.  Avoid risky sex practices that can break your skin.  Do not have sex if you have open sores on your mouth or skin.  Avoid drinking too much alcohol or taking illegal drugs. Alcohol and drugs can affect your judgment and put you in a vulnerable position.  Avoid engaging in oral and anal sex acts.  Get vaccinated for HPV and hepatitis. If you have not received these vaccines in the past, talk to your health care provider about whether one  or both might be right for you.   If you are at risk of being infected with HIV, it is recommended that you take a prescription medicine daily to prevent HIV infection. This is called pre-exposure prophylaxis (PrEP). You are considered at risk if:  You are a man who has sex with other men (MSM).  You are a heterosexual man or woman and are sexually active with more than one partner.  You take drugs by injection.  You are sexually active with a partner who has HIV.  Talk with your health care provider about whether you are at high risk of being infected with HIV. If you choose to begin PrEP, you should first be tested for HIV. You should then be tested every 3 months for as long as you are taking PrEP.  WHAT SHOULD I DO IF I THINK I HAVE AN STD?  See your health care provider.   Tell your sexual partner(s). They should be tested and treated for any STDs.  Do not have sex until your health care provider says it is okay. WHEN SHOULD I GET IMMEDIATE MEDICAL CARE? Contact your health care provider right away if:   You have severe abdominal pain.  You are a man and notice swelling or pain in your testicles.  You are a woman and notice swelling or pain in your vagina. Document Released: 02/24/2003 Document Revised: 12/09/2013 Document Reviewed: 06/24/2013 Exit[employer] Patient Information 2015 [employer]. This information is not intended to replace advice given to you by your health care provider. Make sure you discuss any questions you have with your health care provider.  

## 2014-12-10 NOTE — ED Notes (Signed)
Patient states his girlfriend was diagnosed with std yesterday.   Patient states he was unsure of what she has been diagnosed with.  Patient states he has been having discharge for 4-5 days and burning when he urinates.

## 2014-12-11 LAB — GC/CHLAMYDIA PROBE AMP
CT Probe RNA: NEGATIVE
GC Probe RNA: NEGATIVE

## 2014-12-12 ENCOUNTER — Telehealth: Payer: Self-pay | Admitting: Infectious Disease

## 2014-12-12 LAB — HIV 1/2 CONFIRMATION
HIV 1 ANTIBODY: POSITIVE — AB
HIV-2 Ab: NEGATIVE

## 2014-12-12 LAB — RPR

## 2014-12-12 LAB — HIV ANTIBODY (ROUTINE TESTING W REFLEX): HIV 1&2 Ab, 4th Generation: REACTIVE — AB

## 2014-12-12 NOTE — Telephone Encounter (Signed)
Patient was tested for HIV in the ED (which he has been known to have) and I was alerted via [employer]. He has been followed by me but not seen since January 2015  Can we get him back into clinic for labs and visit?

## 2014-12-14 NOTE — Telephone Encounter (Signed)
Patient has an appt with Dr. Luciana Axe on 12/31/14. He does not have a working phone number.

## 2014-12-23 ENCOUNTER — Encounter (HOSPITAL_COMMUNITY): Payer: Self-pay

## 2014-12-23 ENCOUNTER — Telehealth: Payer: Self-pay | Admitting: Infectious Disease

## 2014-12-23 NOTE — Telephone Encounter (Signed)
Patient is being followed by Dr Gerarda Gunther at [facility] for HIV care. Should not be considered our patient at this point though he will likely bounce around between different health care systems  Please cancel his upcoming appt with Dr. Luciana Axe

## 2014-12-31 ENCOUNTER — Ambulatory Visit: Payer: Self-pay | Admitting: Internal Medicine

## 2015-04-02 DIAGNOSIS — A6001 Herpesviral infection of penis: Secondary | ICD-10-CM | POA: Insufficient documentation

## 2015-04-02 DIAGNOSIS — F5101 Primary insomnia: Secondary | ICD-10-CM | POA: Insufficient documentation

## 2015-04-26 DIAGNOSIS — F332 Major depressive disorder, recurrent severe without psychotic features: Secondary | ICD-10-CM | POA: Insufficient documentation

## 2015-06-01 ENCOUNTER — Other Ambulatory Visit: Payer: Medicaid Other

## 2015-06-01 DIAGNOSIS — B20 Human immunodeficiency virus [HIV] disease: Secondary | ICD-10-CM

## 2015-06-01 LAB — COMPLETE METABOLIC PANEL WITH GFR
ALT: 29 U/L (ref 0–53)
AST: 31 U/L (ref 0–37)
Albumin: 4.2 g/dL (ref 3.5–5.2)
Alkaline Phosphatase: 51 U/L (ref 39–117)
BUN: 14 mg/dL (ref 6–23)
CALCIUM: 9.2 mg/dL (ref 8.4–10.5)
CHLORIDE: 105 meq/L (ref 96–112)
CO2: 28 meq/L (ref 19–32)
CREATININE: 1.22 mg/dL (ref 0.50–1.35)
GFR, Est African American: 78 mL/min
GFR, Est Non African American: 67 mL/min
Glucose, Bld: 93 mg/dL (ref 70–99)
POTASSIUM: 3.9 meq/L (ref 3.5–5.3)
Sodium: 143 mEq/L (ref 135–145)
Total Bilirubin: 1.2 mg/dL (ref 0.2–1.2)
Total Protein: 6.6 g/dL (ref 6.0–8.3)

## 2015-06-02 LAB — T-HELPER CELL (CD4) - (RCID CLINIC ONLY)
CD4 T CELL HELPER: 36 % (ref 33–55)
CD4 T Cell Abs: 400 /uL (ref 400–2700)

## 2015-06-02 LAB — CBC WITH DIFFERENTIAL/PLATELET
BASOS ABS: 0 10*3/uL (ref 0.0–0.1)
BASOS PCT: 2 % — AB (ref 0–1)
EOS ABS: 0.1 10*3/uL (ref 0.0–0.7)
EOS PCT: 4 % (ref 0–5)
HEMATOCRIT: 54.8 % — AB (ref 39.0–52.0)
Hemoglobin: 19.4 g/dL — ABNORMAL HIGH (ref 13.0–17.0)
Lymphocytes Relative: 52 % — ABNORMAL HIGH (ref 12–46)
Lymphs Abs: 1.1 10*3/uL (ref 0.7–4.0)
MCH: 34.8 pg — ABNORMAL HIGH (ref 26.0–34.0)
MCHC: 35.4 g/dL (ref 30.0–36.0)
MCV: 98.4 fL (ref 78.0–100.0)
MONOS PCT: 11 % (ref 3–12)
MPV: 11.2 fL (ref 8.6–12.4)
Monocytes Absolute: 0.2 10*3/uL (ref 0.1–1.0)
NEUTROS PCT: 31 % — AB (ref 43–77)
Neutro Abs: 0.7 10*3/uL — ABNORMAL LOW (ref 1.7–7.7)
Platelets: 138 10*3/uL — ABNORMAL LOW (ref 150–400)
RBC: 5.57 MIL/uL (ref 4.22–5.81)
RDW: 14.8 % (ref 11.5–15.5)
WBC: 2.1 10*3/uL — ABNORMAL LOW (ref 4.0–10.5)

## 2015-06-02 LAB — RPR

## 2015-06-02 LAB — HIV-1 RNA QUANT-NO REFLEX-BLD: HIV 1 RNA Quant: <20 copies/mL (ref <20)

## 2015-06-02 LAB — MICROALBUMIN / CREATININE URINE RATIO
Creatinine, Urine: 91.9 mg/dL
MICROALB/CREAT RATIO: 15.2 mg/g (ref 0.0–30.0)
Microalb, Ur: 1.4 mg/dL (ref <2.0)

## 2015-06-04 ENCOUNTER — Emergency Department (HOSPITAL_COMMUNITY)
Admission: EM | Admit: 2015-06-04 | Discharge: 2015-06-04 | Disposition: A | Discharge status: Home / Self Care | Payer: Medicaid Other | Attending: Emergency Medicine | Admitting: Emergency Medicine

## 2015-06-04 ENCOUNTER — Encounter (HOSPITAL_COMMUNITY): Payer: Self-pay

## 2015-06-04 DIAGNOSIS — Z21 Asymptomatic human immunodeficiency virus [HIV] infection status: Secondary | ICD-10-CM | POA: Diagnosis not present

## 2015-06-04 DIAGNOSIS — F319 Bipolar disorder, unspecified: Secondary | ICD-10-CM | POA: Diagnosis not present

## 2015-06-04 DIAGNOSIS — R300 Dysuria: Secondary | ICD-10-CM | POA: Diagnosis not present

## 2015-06-04 DIAGNOSIS — Z792 Long term (current) use of antibiotics: Secondary | ICD-10-CM | POA: Insufficient documentation

## 2015-06-04 DIAGNOSIS — Z720 Tobacco use: Secondary | ICD-10-CM | POA: Diagnosis not present

## 2015-06-04 DIAGNOSIS — I10 Essential (primary) hypertension: Secondary | ICD-10-CM | POA: Diagnosis not present

## 2015-06-04 DIAGNOSIS — R369 Urethral discharge, unspecified: Secondary | ICD-10-CM | POA: Diagnosis not present

## 2015-06-04 DIAGNOSIS — F419 Anxiety disorder, unspecified: Secondary | ICD-10-CM | POA: Diagnosis not present

## 2015-06-04 DIAGNOSIS — E785 Hyperlipidemia, unspecified: Secondary | ICD-10-CM | POA: Diagnosis not present

## 2015-06-04 DIAGNOSIS — I5022 Chronic systolic (congestive) heart failure: Secondary | ICD-10-CM | POA: Diagnosis not present

## 2015-06-04 DIAGNOSIS — F4310 Post-traumatic stress disorder, unspecified: Secondary | ICD-10-CM | POA: Insufficient documentation

## 2015-06-04 DIAGNOSIS — Z7951 Long term (current) use of inhaled steroids: Secondary | ICD-10-CM | POA: Insufficient documentation

## 2015-06-04 DIAGNOSIS — J449 Chronic obstructive pulmonary disease, unspecified: Secondary | ICD-10-CM | POA: Insufficient documentation

## 2015-06-04 DIAGNOSIS — Z7982 Long term (current) use of aspirin: Secondary | ICD-10-CM | POA: Diagnosis not present

## 2015-06-04 DIAGNOSIS — Z8619 Personal history of other infectious and parasitic diseases: Secondary | ICD-10-CM | POA: Insufficient documentation

## 2015-06-04 DIAGNOSIS — Z202 Contact with and (suspected) exposure to infections with a predominantly sexual mode of transmission: Secondary | ICD-10-CM | POA: Insufficient documentation

## 2015-06-04 DIAGNOSIS — Z79899 Other long term (current) drug therapy: Secondary | ICD-10-CM | POA: Diagnosis not present

## 2015-06-04 LAB — URINALYSIS, ROUTINE W REFLEX MICROSCOPIC
Bilirubin Urine: NEGATIVE
Glucose, UA: NEGATIVE mg/dL
HGB URINE DIPSTICK: NEGATIVE
Ketones, ur: NEGATIVE mg/dL
Leukocytes, UA: NEGATIVE
Nitrite: NEGATIVE
Protein, ur: NEGATIVE mg/dL
Specific Gravity, Urine: 1.011 (ref 1.005–1.030)
UROBILINOGEN UA: 0.2 mg/dL (ref 0.0–1.0)
pH: 5 (ref 5.0–8.0)

## 2015-06-04 LAB — GC/CHLAMYDIA PROBE AMP (~~LOC~~) NOT AT ARMC
Chlamydia: NEGATIVE
NEISSERIA GONORRHEA: NEGATIVE

## 2015-06-04 LAB — RPR: RPR Ser Ql: NONREACTIVE

## 2015-06-04 MED ORDER — CEFTRIAXONE SODIUM 250 MG IJ SOLR
250.0000 mg | Freq: Once | INTRAMUSCULAR | Status: AC
  Administered 2015-06-04: 250 mg via INTRAMUSCULAR
  Filled 2015-06-04: qty 250

## 2015-06-04 MED ORDER — DOXYCYCLINE HYCLATE 100 MG PO CAPS: 100.0000 mg | ORAL_CAPSULE | Freq: Two times a day (BID) | ORAL | Status: DC

## 2015-06-04 MED ORDER — LIDOCAINE HCL (PF) 1 % IJ SOLN
INTRAMUSCULAR | Status: AC
  Administered 2015-06-04: 0.9 mL
  Filled 2015-06-04: qty 5

## 2015-06-04 MED ORDER — AZITHROMYCIN 250 MG PO TABS
1000.0000 mg | ORAL_TABLET | Freq: Once | ORAL | Status: AC
  Administered 2015-06-04: 1000 mg via ORAL
  Filled 2015-06-04: qty 4

## 2015-06-04 NOTE — ED Notes (Signed)
Pt. States he needs to be checked for std. States he started noticing symptoms on Wednesday. Watery discharge from penis and pain with urination.

## 2015-06-04 NOTE — Discharge Instructions (Signed)
Sexually Transmitted Disease A sexually transmitted disease (STD) is a disease or infection that may be passed (transmitted) from person to person, usually during sexual activity. This may happen by way of saliva, semen, blood, vaginal mucus, or urine. Common STDs include:   Gonorrhea.   Chlamydia.   Syphilis.   HIV and AIDS.   Genital herpes.   Hepatitis B and C.   Trichomonas.   Human papillomavirus (HPV).   Pubic lice.   Scabies.  Mites.  Bacterial vaginosis. WHAT ARE CAUSES OF STDs? An STD may be caused by bacteria, a virus, or parasites. STDs are often transmitted during sexual activity if one person is infected. However, they may also be transmitted through nonsexual means. STDs may be transmitted after:   Sexual intercourse with an infected person.   Sharing sex toys with an infected person.   Sharing needles with an infected person or using unclean piercing or tattoo needles.  Having intimate contact with the genitals, mouth, or rectal areas of an infected person.   Exposure to infected fluids during birth. WHAT ARE THE SIGNS AND SYMPTOMS OF STDs? Different STDs have different symptoms. Some people may not have any symptoms. If symptoms are present, they may include:   Painful or bloody urination.   Pain in the pelvis, abdomen, vagina, anus, throat, or eyes.   A skin rash, itching, or irritation.  Growths, ulcerations, blisters, or sores in the genital and anal areas.  Abnormal vaginal discharge with or without bad odor.   Penile discharge in men.   Fever.   Pain or bleeding during sexual intercourse.   Swollen glands in the groin area.   Yellow skin and eyes (jaundice). This is seen with hepatitis.   Swollen testicles.  Infertility.  Sores and blisters in the mouth. HOW ARE STDs DIAGNOSED? To make a diagnosis, your health care provider may:   Take a medical history.   Perform a physical exam.   Take a sample of  any discharge to examine.  Swab the throat, cervix, opening to the penis, rectum, or vagina for testing.  Test a sample of your first morning urine.   Perform blood tests.   Perform a Pap test, if this applies.   Perform a colposcopy.   Perform a laparoscopy.  HOW ARE STDs TREATED? Treatment depends on the STD. Some STDs may be treated but not cured.   Chlamydia, gonorrhea, trichomonas, and syphilis can be cured with antibiotic medicine.   Genital herpes, hepatitis, and HIV can be treated, but not cured, with prescribed medicines. The medicines lessen symptoms.   Genital warts from HPV can be treated with medicine or by freezing, burning (electrocautery), or surgery. Warts may come back.   HPV cannot be cured with medicine or surgery. However, abnormal areas may be removed from the cervix, vagina, or vulva.   If your diagnosis is confirmed, your recent sexual partners need treatment. This is true even if they are symptom-free or have a negative culture or evaluation. They should not have sex until their health care providers say it is okay. HOW CAN I REDUCE MY RISK OF GETTING AN STD? Take these steps to reduce your risk of getting an STD:  Use latex condoms, dental dams, and water-soluble lubricants during sexual activity. Do not use petroleum jelly or oils.  Avoid having multiple sex partners.  Do not have sex with someone who has other sex partners.  Do not have sex with anyone you do not know or who is at   high risk for an STD.  Avoid risky sex practices that can break your skin.  Do not have sex if you have open sores on your mouth or skin.  Avoid drinking too much alcohol or taking illegal drugs. Alcohol and drugs can affect your judgment and put you in a vulnerable position.  Avoid engaging in oral and anal sex acts.  Get vaccinated for HPV and hepatitis. If you have not received these vaccines in the past, talk to your health care provider about whether one  or both might be right for you.   If you are at risk of being infected with HIV, it is recommended that you take a prescription medicine daily to prevent HIV infection. This is called pre-exposure prophylaxis (PrEP). You are considered at risk if:  You are a man who has sex with other men (MSM).  You are a heterosexual man or woman and are sexually active with more than one partner.  You take drugs by injection.  You are sexually active with a partner who has HIV.  Talk with your health care provider about whether you are at high risk of being infected with HIV. If you choose to begin PrEP, you should first be tested for HIV. You should then be tested every 3 months for as long as you are taking PrEP.  WHAT SHOULD I DO IF I THINK I HAVE AN STD?  See your health care provider.   Tell your sexual partner(s). They should be tested and treated for any STDs.  Do not have sex until your health care provider says it is okay. WHEN SHOULD I GET IMMEDIATE MEDICAL CARE? Contact your health care provider right away if:   You have severe abdominal pain.  You are a man and notice swelling or pain in your testicles.  You are a woman and notice swelling or pain in your vagina. Document Released: 02/24/2003 Document Revised: 12/09/2013 Document Reviewed: 06/24/2013 Exit[employer] Patient Information 2015 [employer]. This information is not intended to replace advice given to you by your health care provider. Make sure you discuss any questions you have with your health care provider.  

## 2015-06-04 NOTE — ED Provider Notes (Signed)
CSN: [id number]     Arrival date & time 06/04/15  4098 History   First MD Initiated Contact with Patient 06/04/15 [phone]     Chief Complaint  Patient presents with  . SEXUALLY TRANSMITTED DISEASE     (Consider location/radiation/quality/duration/timing/severity/associated sxs/prior Treatment) HPI   54 year old male with history of HIV currently on Truvada (CD4 400, viral load undetectable), history of polysubstance abuse and history of bipolar disorder who presents for evaluation of dysuria and penile discharge. Patient states that he has been sexually active with another HIV partner. 3 days ago he developed dysuria including burning urination and urinary frequency with watery discharge. Symptom has been persistent. No associated fever, abdominal pain, back pain, or rash. He has been compliant with his medication. He did not use protection. He is concerning for STD. He is not allergic to any medication. Has been no specific treatment tried.  Past Medical History  Diagnosis Date  . HIV (human immunodeficiency virus infection)   . Chronic systolic heart failure   . Hypertension   . Genital herpes   . Active smoker   . AR (allergic rhinitis)   . HLD (hyperlipidemia)   . NICM (nonischemic cardiomyopathy)     Echocardiogram 06/28/11: EF 30-35%, mild MR, mild LAE;  No CAD by coronary CT angiogram 3/12 at [facility]  . NSVT (nonsustained ventricular tachycardia)   . Depression   . Bipolar 1 disorder   . Alcohol abuse   . Crack cocaine use   . COPD (chronic obstructive pulmonary disease)   . CHF (congestive heart failure)   . Anxiety   . PTSD (post-traumatic stress disorder)    No past surgical history on file. Family History  Problem Relation Age of Onset  . Hypertension Father   . CAD Father    History  Substance Use Topics  . Smoking status: Current Every Day Smoker -- 1.00 packs/day for 30 years    Types: Cigarettes  . Smokeless tobacco: Never Used     Comment:  15 ciggerettes daily  . Alcohol Use: Yes     Comment: sporadic; approximately every 2 -3 weeks    Review of Systems  Constitutional: Negative for fever.  Gastrointestinal: Negative for abdominal pain and rectal pain.  Genitourinary: Positive for dysuria and discharge. Negative for flank pain.  Skin: Negative for rash.  Neurological: Negative for headaches.      Allergies  Review of patient's allergies indicates no known allergies.  Home Medications   Prior to Admission medications   Medication Sig Start Date End Date Taking? Authorizing Provider  albuterol (PROVENTIL HFA;VENTOLIN HFA) 108 (90 BASE) MCG/ACT inhaler Inhale 2 puffs into the lungs every 4 (four) hours as needed for shortness of breath. 08/31/14   Sanjuana Kava, NP  aspirin 81 MG chewable tablet Chew 1 tablet (81 mg total) by mouth every morning. For heart health 08/31/14   Sanjuana Kava, NP  azithromycin (ZITHROMAX) 250 MG tablet Take 4 tablets (1,000 mg total) by mouth once. Take first 2 tablets together, then 1 every day until finished. 09/01/14   Eber Hong, MD  buPROPion (WELLBUTRIN XL) 150 MG 24 hr tablet Take 1 tablet (150 mg total) by mouth daily. For depression 08/31/14   Sanjuana Kava, NP  cetirizine (ZYRTEC) 10 MG tablet Take 1 tablet (10 mg total) by mouth daily. For allergies 08/31/14   Sanjuana Kava, NP  digoxin (LANOXIN) 0.125 MG tablet Take 1 tablet (125 mcg total) by mouth daily. For control  of heart arrythmia. 08/31/14   Sanjuana Kava, NP  doxycycline (VIBRAMYCIN) 100 MG capsule Take 1 capsule (100 mg total) by mouth 2 (two) times daily. 09/01/14   Eber Hong, MD  emtricitabine-tenofovir (TRUVADA) 200-300 MG per tablet Take 1 tablet by mouth daily. For HIV infection 08/31/14   Sanjuana Kava, NP  furosemide (LASIX) 40 MG tablet Take 1 tablet (40 mg total) by mouth daily. For edema 08/31/14   Sanjuana Kava, NP  hydrOXYzine (ATARAX/VISTARIL) 25 MG tablet Take 1-2 tablets (25-50 mg total) by mouth 3 (three) times  daily as needed for anxiety. 08/31/14   Sanjuana Kava, NP  isosorbide dinitrate (ISORDIL) 5 MG tablet Take 1 tablet (5 mg total) by mouth 3 (three) times daily. For heart condition 08/31/14   Sanjuana Kava, NP  lisinopril (PRINIVIL,ZESTRIL) 2.5 MG tablet Take 1 tablet (2.5 mg total) by mouth daily. For high blood pressure 08/31/14   Sanjuana Kava, NP  metoprolol succinate (TOPROL-XL) 25 MG 24 hr tablet Take 1 tablet (25 mg total) by mouth daily. For high blood pressure 08/31/14   Sanjuana Kava, NP  Multiple Vitamin (MULTI-VITAMINS) TABS Take 1 tablet by mouth daily. For low vitamin 08/31/14   Sanjuana Kava, NP  nicotine (RA NICOTINE) 21 mg/24hr patch Place 1 patch (21 mg total) onto the skin daily. For nicotine addiction 08/31/14   Sanjuana Kava, NP  potassium chloride (K-DUR,KLOR-CON) 10 MEQ tablet Take 1 tablet (10 mEq total) by mouth daily. For low potassium 08/31/14   Sanjuana Kava, NP  raltegravir (ISENTRESS) 400 MG tablet Take 1 tablet (400 mg total) by mouth 2 (two) times daily. For HIV infection 08/31/14   Sanjuana Kava, NP  risperiDONE (RISPERDAL) 0.5 MG tablet Take 1 tablet (0.5 mg total) by mouth at bedtime. For mood control 08/31/14   Sanjuana Kava, NP  spironolactone (ALDACTONE) 25 MG tablet Take 0.5 tablets (12.5 mg total) by mouth daily. For heart condition 08/31/14   Sanjuana Kava, NP  traZODone (DESYREL) 100 MG tablet Take 1 tablet (100 mg total) by mouth at bedtime. For sleep 08/31/14   Sanjuana Kava, NP  valACYclovir (VALTREX) 500 MG tablet Take 1 tablet (500 mg total) by mouth 2 (two) times daily. For Herpes. 08/31/14   Sanjuana Kava, NP   There were no vitals taken for this visit. Physical Exam  Constitutional: He appears well-developed and well-nourished. No distress.  HENT:  Head: Atraumatic.  Eyes: Conjunctivae are normal.  Neck: Neck supple.  Abdominal: Soft. There is no tenderness.  Genitourinary:  Chaperone present during exam. Normal circumcised penis free of lesion. Testicle  nontender's, no scrotal swelling, no inguinal hernia or inguinal lymphadenopathy.  Neurological: He is alert.  Skin: No rash noted.  Psychiatric: He has a normal mood and affect.  Nursing note and vitals reviewed.   ED Course  Procedures (including critical care time)  HIV-positive patient here with penile discharge and dysuria concerning for STDs. Plan to obtain GC chlamydia, UA, and RPR. Will give prophylactic Rocephin and Zithromax in the ED.  9:34 AM UA without evidence of UTI. Culture sent. Given his history of HIV and having burning urination and penile discharge patient is concerned therefore I will prescribe doxycycline to cover for STD.  Labs Review Labs Reviewed  URINALYSIS, ROUTINE W REFLEX MICROSCOPIC (NOT AT [facility])  RPR  GC/CHLAMYDIA PROBE AMP ([place]) NOT AT [facility]    Imaging Review No results found.  EKG Interpretation None      MDM   Final diagnoses:  STD exposure    BP 116/77 mmHg  Pulse 100  Temp(Src) 98.3 F (36.8 C) (Oral)  Resp 18  SpO2 100%     Fayrene Helper, PA-C 06/04/15 0865  Eber Hong, MD 06/05/15 [phone]

## 2015-06-11 ENCOUNTER — Inpatient Hospital Stay (HOSPITAL_COMMUNITY): Admission: RE | Admit: 2015-06-11 | Payer: Self-pay | Source: Ambulatory Visit

## 2015-06-29 ENCOUNTER — Ambulatory Visit: Payer: Self-pay | Admitting: Infectious Disease

## 2015-08-04 ENCOUNTER — Encounter (HOSPITAL_COMMUNITY): Payer: Self-pay | Admitting: Emergency Medicine

## 2015-08-04 ENCOUNTER — Emergency Department (HOSPITAL_COMMUNITY)
Admission: EM | Admit: 2015-08-04 | Discharge: 2015-08-06 | Disposition: A | Discharge status: Other Acute Inpatient Hospital | Payer: Medicaid Other | Attending: Emergency Medicine | Admitting: Emergency Medicine

## 2015-08-04 DIAGNOSIS — Z8639 Personal history of other endocrine, nutritional and metabolic disease: Secondary | ICD-10-CM | POA: Insufficient documentation

## 2015-08-04 DIAGNOSIS — R369 Urethral discharge, unspecified: Secondary | ICD-10-CM | POA: Insufficient documentation

## 2015-08-04 DIAGNOSIS — Z720 Tobacco use: Secondary | ICD-10-CM | POA: Diagnosis not present

## 2015-08-04 DIAGNOSIS — F329 Major depressive disorder, single episode, unspecified: Secondary | ICD-10-CM | POA: Diagnosis not present

## 2015-08-04 DIAGNOSIS — Z8619 Personal history of other infectious and parasitic diseases: Secondary | ICD-10-CM | POA: Diagnosis not present

## 2015-08-04 DIAGNOSIS — N4889 Other specified disorders of penis: Secondary | ICD-10-CM | POA: Insufficient documentation

## 2015-08-04 DIAGNOSIS — Z21 Asymptomatic human immunodeficiency virus [HIV] infection status: Secondary | ICD-10-CM | POA: Insufficient documentation

## 2015-08-04 DIAGNOSIS — Z7982 Long term (current) use of aspirin: Secondary | ICD-10-CM | POA: Insufficient documentation

## 2015-08-04 DIAGNOSIS — Z792 Long term (current) use of antibiotics: Secondary | ICD-10-CM | POA: Diagnosis not present

## 2015-08-04 DIAGNOSIS — R45851 Suicidal ideations: Secondary | ICD-10-CM

## 2015-08-04 DIAGNOSIS — J449 Chronic obstructive pulmonary disease, unspecified: Secondary | ICD-10-CM | POA: Diagnosis not present

## 2015-08-04 DIAGNOSIS — I5022 Chronic systolic (congestive) heart failure: Secondary | ICD-10-CM | POA: Insufficient documentation

## 2015-08-04 DIAGNOSIS — F32A Depression, unspecified: Secondary | ICD-10-CM

## 2015-08-04 DIAGNOSIS — F4310 Post-traumatic stress disorder, unspecified: Secondary | ICD-10-CM | POA: Diagnosis not present

## 2015-08-04 DIAGNOSIS — F1410 Cocaine abuse, uncomplicated: Secondary | ICD-10-CM | POA: Insufficient documentation

## 2015-08-04 DIAGNOSIS — F1994 Other psychoactive substance use, unspecified with psychoactive substance-induced mood disorder: Secondary | ICD-10-CM | POA: Diagnosis not present

## 2015-08-04 DIAGNOSIS — Z79899 Other long term (current) drug therapy: Secondary | ICD-10-CM | POA: Diagnosis not present

## 2015-08-04 DIAGNOSIS — I472 Ventricular tachycardia: Secondary | ICD-10-CM | POA: Insufficient documentation

## 2015-08-04 DIAGNOSIS — I10 Essential (primary) hypertension: Secondary | ICD-10-CM | POA: Insufficient documentation

## 2015-08-04 DIAGNOSIS — F419 Anxiety disorder, unspecified: Secondary | ICD-10-CM | POA: Insufficient documentation

## 2015-08-04 LAB — COMPREHENSIVE METABOLIC PANEL
ALBUMIN: 3.9 g/dL (ref 3.5–5.0)
ALK PHOS: 52 U/L (ref 38–126)
ALT: 59 U/L (ref 17–63)
AST: 59 U/L — AB (ref 15–41)
Anion gap: 7 (ref 5–15)
BUN: 13 mg/dL (ref 6–20)
CALCIUM: 8.6 mg/dL — AB (ref 8.9–10.3)
CO2: 24 mmol/L (ref 22–32)
CREATININE: 0.98 mg/dL (ref 0.61–1.24)
Chloride: 110 mmol/L (ref 101–111)
GFR calc Af Amer: >60 mL/min (ref >60)
GFR calc non Af Amer: >60 mL/min (ref >60)
GLUCOSE: 93 mg/dL (ref 65–99)
Potassium: 3.3 mmol/L — ABNORMAL LOW (ref 3.5–5.1)
SODIUM: 141 mmol/L (ref 135–145)
Total Bilirubin: 0.9 mg/dL (ref 0.3–1.2)
Total Protein: 6.3 g/dL — ABNORMAL LOW (ref 6.5–8.1)

## 2015-08-04 LAB — CBC
HEMATOCRIT: 45.6 % (ref 39.0–52.0)
HEMOGLOBIN: 16.5 g/dL (ref 13.0–17.0)
MCH: 34.2 pg — AB (ref 26.0–34.0)
MCHC: 36.2 g/dL — AB (ref 30.0–36.0)
MCV: 94.6 fL (ref 78.0–100.0)
Platelets: 149 10*3/uL — ABNORMAL LOW (ref 150–400)
RBC: 4.82 MIL/uL (ref 4.22–5.81)
RDW: 13.4 % (ref 11.5–15.5)
WBC: 2.6 10*3/uL — ABNORMAL LOW (ref 4.0–10.5)

## 2015-08-04 LAB — RAPID URINE DRUG SCREEN, HOSP PERFORMED
AMPHETAMINES: NOT DETECTED
BARBITURATES: NOT DETECTED
Benzodiazepines: NOT DETECTED
Cocaine: POSITIVE — AB
Opiates: NOT DETECTED
TETRAHYDROCANNABINOL: NOT DETECTED

## 2015-08-04 LAB — ETHANOL: Alcohol, Ethyl (B): <5 mg/dL (ref <5)

## 2015-08-04 LAB — SALICYLATE LEVEL: Salicylate Lvl: <4 mg/dL (ref 2.8–30.0)

## 2015-08-04 LAB — ACETAMINOPHEN LEVEL: Acetaminophen (Tylenol), Serum: <10 ug/mL — ABNORMAL LOW (ref 10–30)

## 2015-08-04 MED ORDER — LIDOCAINE HCL (PF) 1 % IJ SOLN
INTRAMUSCULAR | Status: AC
Start: 2015-08-04 — End: 2015-08-05
  Filled 2015-08-04: qty 5

## 2015-08-04 MED ORDER — EMTRICITABINE-TENOFOVIR DF 200-300 MG PO TABS
1.0000 | ORAL_TABLET | Freq: Every day | ORAL | Status: DC
  Administered 2015-08-05 – 2015-08-06 (×2): 1 via ORAL
  Filled 2015-08-04 (×3): qty 1

## 2015-08-04 MED ORDER — ACETAMINOPHEN 325 MG PO TABS: 650.0000 mg | ORAL_TABLET | ORAL | Status: DC | PRN

## 2015-08-04 MED ORDER — ISOSORBIDE DINITRATE 5 MG PO TABS
5.0000 mg | ORAL_TABLET | Freq: Three times a day (TID) | ORAL | Status: DC
  Administered 2015-08-05 – 2015-08-06 (×6): 5 mg via ORAL
  Filled 2015-08-04 (×10): qty 1

## 2015-08-04 MED ORDER — TRAZODONE HCL 100 MG PO TABS
100.0000 mg | ORAL_TABLET | Freq: Every day | ORAL | Status: DC
  Administered 2015-08-05 (×2): 100 mg via ORAL
  Filled 2015-08-04 (×2): qty 1

## 2015-08-04 MED ORDER — RALTEGRAVIR POTASSIUM 400 MG PO TABS
400.0000 mg | ORAL_TABLET | Freq: Two times a day (BID) | ORAL | Status: DC
  Administered 2015-08-05 – 2015-08-06 (×4): 400 mg via ORAL
  Filled 2015-08-04 (×7): qty 1

## 2015-08-04 MED ORDER — DIGOXIN 125 MCG PO TABS
125.0000 ug | ORAL_TABLET | Freq: Every day | ORAL | Status: DC
  Administered 2015-08-05 – 2015-08-06 (×2): 125 ug via ORAL
  Filled 2015-08-04 (×3): qty 1

## 2015-08-04 MED ORDER — POTASSIUM CHLORIDE CRYS ER 20 MEQ PO TBCR
20.0000 meq | EXTENDED_RELEASE_TABLET | Freq: Once | ORAL | Status: AC
  Administered 2015-08-05: 20 meq via ORAL

## 2015-08-04 MED ORDER — SPIRONOLACTONE 12.5 MG HALF TABLET
12.5000 mg | ORAL_TABLET | Freq: Every day | ORAL | Status: DC
  Administered 2015-08-05 – 2015-08-06 (×2): 12.5 mg via ORAL
  Filled 2015-08-04 (×3): qty 1

## 2015-08-04 MED ORDER — ASPIRIN 81 MG PO CHEW
81.0000 mg | CHEWABLE_TABLET | Freq: Every morning | ORAL | Status: DC
  Administered 2015-08-05 – 2015-08-06 (×2): 81 mg via ORAL
  Filled 2015-08-04 (×2): qty 1

## 2015-08-04 MED ORDER — IBUPROFEN 200 MG PO TABS: 600.0000 mg | ORAL_TABLET | Freq: Three times a day (TID) | ORAL | Status: DC | PRN

## 2015-08-04 MED ORDER — METOPROLOL SUCCINATE ER 25 MG PO TB24
25.0000 mg | ORAL_TABLET | Freq: Every day | ORAL | Status: DC
  Administered 2015-08-05 – 2015-08-06 (×2): 25 mg via ORAL
  Filled 2015-08-04 (×3): qty 1

## 2015-08-04 MED ORDER — FUROSEMIDE 40 MG PO TABS
40.0000 mg | ORAL_TABLET | Freq: Every day | ORAL | Status: DC
  Administered 2015-08-05 – 2015-08-06 (×3): 40 mg via ORAL
  Filled 2015-08-04 (×3): qty 1

## 2015-08-04 MED ORDER — POTASSIUM CHLORIDE CRYS ER 10 MEQ PO TBCR
10.0000 meq | EXTENDED_RELEASE_TABLET | Freq: Every day | ORAL | Status: DC
  Administered 2015-08-05 – 2015-08-06 (×3): 10 meq via ORAL
  Filled 2015-08-04 (×3): qty 1

## 2015-08-04 MED ORDER — DOXYCYCLINE HYCLATE 100 MG PO TABS
100.0000 mg | ORAL_TABLET | Freq: Two times a day (BID) | ORAL | Status: DC
  Administered 2015-08-05 – 2015-08-06 (×4): 100 mg via ORAL
  Filled 2015-08-04 (×3): qty 1

## 2015-08-04 MED ORDER — HYDROXYZINE HCL 25 MG PO TABS
25.0000 mg | ORAL_TABLET | Freq: Three times a day (TID) | ORAL | Status: DC | PRN
Start: 2015-08-04 — End: 2015-08-06

## 2015-08-04 MED ORDER — CEFTRIAXONE SODIUM 250 MG IJ SOLR
250.0000 mg | Freq: Once | INTRAMUSCULAR | Status: AC
  Administered 2015-08-05: 250 mg via INTRAMUSCULAR
  Filled 2015-08-04: qty 250

## 2015-08-04 MED ORDER — DOXYCYCLINE HYCLATE 100 MG PO TABS
100.0000 mg | ORAL_TABLET | Freq: Once | ORAL | Status: AC
  Administered 2015-08-05: 100 mg via ORAL
  Filled 2015-08-04: qty 1

## 2015-08-04 MED ORDER — VALACYCLOVIR HCL 500 MG PO TABS
500.0000 mg | ORAL_TABLET | Freq: Two times a day (BID) | ORAL | Status: DC
  Administered 2015-08-05 – 2015-08-06 (×4): 500 mg via ORAL
  Filled 2015-08-04 (×7): qty 1

## 2015-08-04 MED ORDER — ALBUTEROL SULFATE HFA 108 (90 BASE) MCG/ACT IN AERS: 2.0000 | INHALATION_SPRAY | RESPIRATORY_TRACT | Status: DC | PRN

## 2015-08-04 MED ORDER — ZOLPIDEM TARTRATE 5 MG PO TABS
5.0000 mg | ORAL_TABLET | Freq: Every evening | ORAL | Status: DC | PRN
Start: 2015-08-04 — End: 2015-08-06

## 2015-08-04 MED ORDER — BUPROPION HCL ER (XL) 150 MG PO TB24
150.0000 mg | ORAL_TABLET | Freq: Every day | ORAL | Status: DC
  Administered 2015-08-05 – 2015-08-06 (×2): 150 mg via ORAL
  Filled 2015-08-04 (×3): qty 1

## 2015-08-04 MED ORDER — RISPERIDONE 0.5 MG PO TABS
0.5000 mg | ORAL_TABLET | Freq: Every day | ORAL | Status: DC
  Administered 2015-08-05 (×2): 0.5 mg via ORAL
  Filled 2015-08-04 (×2): qty 1

## 2015-08-04 MED ORDER — LISINOPRIL 2.5 MG PO TABS
2.5000 mg | ORAL_TABLET | Freq: Every day | ORAL | Status: DC
  Administered 2015-08-05 – 2015-08-06 (×2): 2.5 mg via ORAL
  Filled 2015-08-04 (×3): qty 1

## 2015-08-04 MED ORDER — LORAZEPAM 1 MG PO TABS
1.0000 mg | ORAL_TABLET | Freq: Three times a day (TID) | ORAL | Status: DC | PRN
Start: 2015-08-04 — End: 2015-08-06
  Administered 2015-08-05: 1 mg via ORAL
  Filled 2015-08-04: qty 1

## 2015-08-04 NOTE — ED Provider Notes (Signed)
CSN: [id number]     Arrival date & time 08/04/15  1943 History  This chart was scribed for Earley Favor, NP, working with Laurence Spates, MD by Chestine Spore, ED Scribe. The patient was seen in room WTR1/WLPT1 at 9:13 PM.    No chief complaint on file.     The history is provided by the patient. No language interpreter was used.     HPI Comments: Ryan Long is a 54 y.o. male with a medical hx of depression, anxiety, HIV, and HSV2 who presents to the Emergency Department complaining of being suicidal onset PTA. Pt reports that he feels like he wants to jump in front of a truck and he is tired of living. Pt has been homeless for a couple months and he has been in a shelter. Pt reports that he lost some new furniture due to there being bed bugs in his place of residence and it is making him depressed. Pt used to take medications for his BH ailments and he last took them 10 days ago. Pt notes that his medications used to be prescribed at [facility]. Pt notes that he was last hospitalized for his depression 1 year ago. He states that he is having associated symptoms of penile pain/discharge, dysuria, frequency, and testicular pain. Pt notes that he was sexually active with a friend who informed him that he needs to be treated for a STD. He denies any other symptoms.    Past Medical History  Diagnosis Date  . HIV (human immunodeficiency virus infection)   . Chronic systolic heart failure   . Hypertension   . Genital herpes   . Active smoker   . AR (allergic rhinitis)   . HLD (hyperlipidemia)   . NICM (nonischemic cardiomyopathy)     Echocardiogram 06/28/11: EF 30-35%, mild MR, mild LAE;  No CAD by coronary CT angiogram 3/12 at [facility]  . NSVT (nonsustained ventricular tachycardia)   . Depression   . Bipolar 1 disorder   . Alcohol abuse   . Crack cocaine use   . COPD (chronic obstructive pulmonary disease)   . CHF (congestive heart failure)   . Anxiety   . PTSD  (post-traumatic stress disorder)    History reviewed. No pertinent past surgical history. Family History  Problem Relation Age of Onset  . Hypertension Father   . CAD Father    Social History  Substance Use Topics  . Smoking status: Current Every Day Smoker -- 1.00 packs/day for 30 years    Types: Cigarettes  . Smokeless tobacco: Never Used     Comment: 15 ciggerettes daily  . Alcohol Use: Yes     Comment: sporadic; approximately every 2 -3 weeks    Review of Systems  Genitourinary: Positive for dysuria, discharge, penile pain and testicular pain.  Psychiatric/Behavioral: Positive for suicidal ideas. Negative for sleep disturbance.  All other systems reviewed and are negative.     Allergies  Review of patient's allergies indicates no known allergies.  Home Medications   Prior to Admission medications   Medication Sig Start Date End Date Taking? Authorizing Provider  albuterol (PROVENTIL HFA;VENTOLIN HFA) 108 (90 BASE) MCG/ACT inhaler Inhale 2 puffs into the lungs every 4 (four) hours as needed for shortness of breath. 08/31/14   Sanjuana Kava, NP  aspirin 81 MG chewable tablet Chew 1 tablet (81 mg total) by mouth every morning. For heart health 08/31/14   Sanjuana Kava, NP  azithromycin (ZITHROMAX) 250 MG tablet Take  4 tablets (1,000 mg total) by mouth once. Take first 2 tablets together, then 1 every day until finished. 09/01/14   Eber Hong, MD  buPROPion (WELLBUTRIN XL) 150 MG 24 hr tablet Take 1 tablet (150 mg total) by mouth daily. For depression 08/31/14   Sanjuana Kava, NP  cetirizine (ZYRTEC) 10 MG tablet Take 1 tablet (10 mg total) by mouth daily. For allergies 08/31/14   Sanjuana Kava, NP  digoxin (LANOXIN) 0.125 MG tablet Take 1 tablet (125 mcg total) by mouth daily. For control of heart arrythmia. 08/31/14   Sanjuana Kava, NP  doxycycline (VIBRAMYCIN) 100 MG capsule Take 1 capsule (100 mg total) by mouth 2 (two) times daily. 06/04/15   Fayrene Helper, PA-C   emtricitabine-tenofovir (TRUVADA) 200-300 MG per tablet Take 1 tablet by mouth daily. For HIV infection 08/31/14   Sanjuana Kava, NP  furosemide (LASIX) 40 MG tablet Take 1 tablet (40 mg total) by mouth daily. For edema 08/31/14   Sanjuana Kava, NP  hydrOXYzine (ATARAX/VISTARIL) 25 MG tablet Take 1-2 tablets (25-50 mg total) by mouth 3 (three) times daily as needed for anxiety. 08/31/14   Sanjuana Kava, NP  isosorbide dinitrate (ISORDIL) 5 MG tablet Take 1 tablet (5 mg total) by mouth 3 (three) times daily. For heart condition 08/31/14   Sanjuana Kava, NP  lisinopril (PRINIVIL,ZESTRIL) 2.5 MG tablet Take 1 tablet (2.5 mg total) by mouth daily. For high blood pressure 08/31/14   Sanjuana Kava, NP  metoprolol succinate (TOPROL-XL) 25 MG 24 hr tablet Take 1 tablet (25 mg total) by mouth daily. For high blood pressure 08/31/14   Sanjuana Kava, NP  Multiple Vitamin (MULTI-VITAMINS) TABS Take 1 tablet by mouth daily. For low vitamin 08/31/14   Sanjuana Kava, NP  nicotine (RA NICOTINE) 21 mg/24hr patch Place 1 patch (21 mg total) onto the skin daily. For nicotine addiction 08/31/14   Sanjuana Kava, NP  potassium chloride (K-DUR,KLOR-CON) 10 MEQ tablet Take 1 tablet (10 mEq total) by mouth daily. For low potassium 08/31/14   Sanjuana Kava, NP  raltegravir (ISENTRESS) 400 MG tablet Take 1 tablet (400 mg total) by mouth 2 (two) times daily. For HIV infection 08/31/14   Sanjuana Kava, NP  risperiDONE (RISPERDAL) 0.5 MG tablet Take 1 tablet (0.5 mg total) by mouth at bedtime. For mood control 08/31/14   Sanjuana Kava, NP  spironolactone (ALDACTONE) 25 MG tablet Take 0.5 tablets (12.5 mg total) by mouth daily. For heart condition 08/31/14   Sanjuana Kava, NP  traZODone (DESYREL) 100 MG tablet Take 1 tablet (100 mg total) by mouth at bedtime. For sleep 08/31/14   Sanjuana Kava, NP  valACYclovir (VALTREX) 500 MG tablet Take 1 tablet (500 mg total) by mouth 2 (two) times daily. For Herpes. 08/31/14   Sanjuana Kava, NP   BP  136/78 mmHg  Pulse 77  Temp(Src) 98.1 F (36.7 C) (Oral)  Resp 18  Ht  (1.753 m)  Wt 191 lb (86.637 kg)  BMI 28.19 kg/m2  SpO2 99% Physical Exam  Constitutional: He is oriented to person, place, and time. He appears well-developed and well-nourished. No distress.  HENT:  Head: Normocephalic and atraumatic.  Eyes: EOM are normal.  Neck: Neck supple. No tracheal deviation present.  Cardiovascular: Normal rate.   Pulmonary/Chest: Effort normal. No respiratory distress.  Musculoskeletal: Normal range of motion.  Neurological: He is alert and oriented to person, place, and time.  Skin: Skin is warm and dry.  Psychiatric: He has a normal mood and affect. His behavior is normal.  Nursing note and vitals reviewed.   ED Course  Procedures (including critical care time) DIAGNOSTIC STUDIES: Oxygen Saturation is 99% on RA, nl by my interpretation.    COORDINATION OF CARE: 9:19 PM Discussed treatment plan with pt at bedside and pt agreed to plan.   Labs Review Labs Reviewed  CBC - Abnormal; Notable for the following:    WBC 2.6 (*)    MCH 34.2 (*)    MCHC 36.2 (*)    Platelets 149 (*)    All other components within normal limits  COMPREHENSIVE METABOLIC PANEL  ETHANOL  SALICYLATE LEVEL  ACETAMINOPHEN LEVEL  URINE RAPID DRUG SCREEN, HOSP PERFORMED  RPR  GC/CHLAMYDIA PROBE AMP ([place]) NOT AT [facility]    Imaging Review No results found. I have personally reviewed and evaluated these images and lab results as part of my medical decision-making.   EKG Interpretation None    patient has been tested for gonorrhea, Chlamydia and syphilis.  He is HIV positive.  Due to his symptoms.  He has been treated for potential gonorrhea and Chlamydia, but due to the use of digoxin.  I have substituted doxycycline for the assistant, niacin, he will need to take this on a regular recess for the next 7 days. Patient has been assessed by TTS he meets criteria for admission, but  unfortunately, there are [facility].  They will actively seek replacement elsewhere  MDM   I personally performed the services described in this documentation, which was scribed in my presence. The recorded information has been reviewed and is accurate.  Earley Favor, NP 08/05/15 1914  Laurence Spates, MD 08/05/15 [phone]

## 2015-08-04 NOTE — ED Notes (Signed)
Pt presents with SI (ideation of jumping in front of truck), denies HI, A/VH. Also c/o pain to penis and testicles, with painful urinary, urinary frequency and discharge.

## 2015-08-05 DIAGNOSIS — F1994 Other psychoactive substance use, unspecified with psychoactive substance-induced mood disorder: Secondary | ICD-10-CM

## 2015-08-05 DIAGNOSIS — R45851 Suicidal ideations: Secondary | ICD-10-CM

## 2015-08-05 LAB — GC/CHLAMYDIA PROBE AMP (~~LOC~~) NOT AT ARMC
Chlamydia: NEGATIVE
Neisseria Gonorrhea: NEGATIVE

## 2015-08-05 LAB — SYPHILIS: RPR W/REFLEX TO RPR TITER AND TREPONEMAL ANTIBODIES, TRADITIONAL SCREENING AND DIAGNOSIS ALGORITHM: RPR Ser Ql: NONREACTIVE

## 2015-08-05 NOTE — BH Assessment (Signed)
Seeking inpt placement. Sent referrals to: [facility]ail, [facility], [place], [facility]   Clista [name], [place] Triage Specialist 08/05/2015 5:14 AM

## 2015-08-05 NOTE — Consult Note (Signed)
Ryan Long   Reason for Long:  Substance induced mood disorder, MDD recurrent by hx  Referring Physician:  EDP Patient Identification: Ryan Long MRN:  [id number] Principal Diagnosis: Substance induced mood disorder Diagnosis:   Patient Active Problem List   Diagnosis Date Noted  . Substance induced mood disorder [F19.94] 11/22/2013    Priority: High  . Polysubstance (excluding opioids) dependence with physiological dependence [F19.20] 12/30/2013  . Influenza with other respiratory manifestations [J11.89] 12/22/2013  . Cocaine abuse, episodic use [F14.10] 03/20/2012    Class: Acute  . Alcohol dependence [F10.20] 03/20/2012    Class: Acute  . Chronic systolic heart failure [G92.01]   . Cardiomyopathy, nonischemic [I42.9] 08/24/2011  . HIV DISEASE [B20] 02/01/2009  . HSV [B00.9] 02/01/2009  . HYPERLIPIDEMIA [E78.5] 02/01/2009  . ALLERGIC RHINITIS [J30.9] 02/01/2009    Total Time spent with patient: 1 hour  Subjective:   Ryan Long is a 54 y.o. male patient admitted with Substance induced mood disorder, MDD recurrent by hx   HPI:  AA male, 54 years old was evaluated for suicidal ideation and increased depression.  Patient was hospitalized back in September last year at [facility] for substance abuse and depression.  Patient reports that he is homeless due to losing his housing and furniture for  [employer] infestation.  Patient also states he has not been taking his MH and HIV medications for couple of months.  Patient reports he has been moving around between [place], [place] and [place] [employer].  Patient states he feels hopeless and helpless and cannot continue to live this way.  He is unable to contract for safety, he reports poor sleep and appetite.  Patient admits to using Cocaine but was unable to quantify his use.  He denies HI/AVH.  He has been accepted for admission and we will be seeking placement at any facility with available beds.  HPI Elements:    Location:  Substance induced mood disorder, MDD, recurrent, severe. Quality:  severe, feels suicidal, hopeless and helpless. Severity:  severe. Timing:  acute. Duration:  Chronic mental illness. Context:  BROUGHT SELF IN SEEKING TREATMENT FOR DEPRESSION.Marland Kitchen  Past Medical History:  Past Medical History  Diagnosis Date  . HIV (human immunodeficiency virus infection)   . Chronic systolic heart failure   . Hypertension   . Genital herpes   . Active smoker   . AR (allergic rhinitis)   . HLD (hyperlipidemia)   . NICM (nonischemic cardiomyopathy)     Echocardiogram 06/28/11: EF 30-35%, mild MR, mild LAE;  No CAD by coronary CT angiogram 3/12 at [facility]  . NSVT (nonsustained ventricular tachycardia)   . Depression   . Bipolar 1 disorder   . Alcohol abuse   . Crack cocaine use   . COPD (chronic obstructive pulmonary disease)   . CHF (congestive heart failure)   . Anxiety   . PTSD (post-traumatic stress disorder)    History reviewed. No pertinent past surgical history. Family History:  Family History  Problem Relation Age of Onset  . Hypertension Father   . CAD Father    Social History:  History  Alcohol Use  . Yes    Comment: sporadic; approximately every 2 -3 weeks     History  Drug Use  . Yes  . Special: Marijuana    Social History   Social History  . Marital Status: Single    Spouse Name: N/A  . Number of Children: N/A  . Years of Education: N/A  Social History Main Topics  . Smoking status: Current Every Day Smoker -- 1.00 packs/day for 30 years    Types: Cigarettes  . Smokeless tobacco: Never Used     Comment: 15 ciggerettes daily  . Alcohol Use: Yes     Comment: sporadic; approximately every 2 -3 weeks  . Drug Use: Yes    Special: Marijuana  . Sexual Activity: Yes    Birth Control/ Protection: Condom     Comment: pt. given condoms   Other Topics Concern  . None   Social History Narrative   Additional Social History:    Pain  Medications: None Prescriptions: See PTA medication list.  Says he has been off meds for over two weeks.  Did not have any refills. Over the Counter: N/A History of alcohol / drug use?: Yes Name of Substance 1: ETOH 1 - Age of First Use: 54 years of age 60 - Amount (size/oz): Varies 1 - Frequency: <1x/M 1 - Duration: On-going  1 - Last Use / Amount: Two days ago Name of Substance 2: Cocaine 2 - Age of First Use: 30's 2 - Amount (size/oz): Varies 2 - Frequency: <1x/M 2 - Duration: On-going 2 - Last Use / Amount: Two days ago                 Allergies:  No Known Allergies  Labs:  Results for orders placed or performed during the hospital encounter of 08/04/15 (from the past 48 hour(s))  Urine rapid drug screen (hosp performed) (Not at [facility])     Status: Abnormal   Collection Time: 08/04/15  8:58 PM  Result Value Ref Range   Opiates NONE DETECTED NONE DETECTED   Cocaine POSITIVE (A) NONE DETECTED   Benzodiazepines NONE DETECTED NONE DETECTED   Amphetamines NONE DETECTED NONE DETECTED   Tetrahydrocannabinol NONE DETECTED NONE DETECTED   Barbiturates NONE DETECTED NONE DETECTED    Comment:        DRUG SCREEN FOR MEDICAL PURPOSES ONLY.  IF CONFIRMATION IS NEEDED FOR ANY PURPOSE, NOTIFY LAB WITHIN 5 DAYS.        LOWEST DETECTABLE LIMITS FOR URINE DRUG SCREEN Drug Class       Cutoff (ng/mL) Amphetamine      1000 Barbiturate      200 Benzodiazepine   948 Tricyclics       016 Opiates          300 Cocaine          300 THC              50   Comprehensive metabolic panel     Status: Abnormal   Collection Time: 08/04/15  9:07 PM  Result Value Ref Range   Sodium 141 135 - 145 mmol/L   Potassium 3.3 (L) 3.5 - 5.1 mmol/L   Chloride 110 101 - 111 mmol/L   CO2 24 22 - 32 mmol/L   Glucose, Bld 93 65 - 99 mg/dL   BUN 13 6 - 20 mg/dL   Creatinine, Ser 0.98 0.61 - 1.24 mg/dL   Calcium 8.6 (L) 8.9 - 10.3 mg/dL   Total Protein 6.3 (L) 6.5 - 8.1 g/dL   Albumin 3.9 3.5 - 5.0 g/dL    AST 59 (H) 15 - 41 U/L   ALT 59 17 - 63 U/L   Alkaline Phosphatase 52 38 - 126 U/L   Total Bilirubin 0.9 0.3 - 1.2 mg/dL   GFR calc non Af Amer >60 >60 mL/min  GFR calc Af Amer >60 >60 mL/min    Comment: (NOTE) The eGFR has been calculated using the CKD EPI equation. This calculation has not been validated in all clinical situations. eGFR's persistently <60 mL/min signify possible Chronic Kidney Disease.    Anion gap 7 5 - 15  Ethanol (ETOH)     Status: None   Collection Time: 08/04/15  9:07 PM  Result Value Ref Range   Alcohol, Ethyl (B) <5 <5 mg/dL    Comment:        LOWEST DETECTABLE LIMIT FOR SERUM ALCOHOL IS 5 mg/dL FOR MEDICAL PURPOSES ONLY   Salicylate level     Status: None   Collection Time: 08/04/15  9:07 PM  Result Value Ref Range   Salicylate Lvl <4.5 2.8 - 30.0 mg/dL  Acetaminophen level     Status: Abnormal   Collection Time: 08/04/15  9:07 PM  Result Value Ref Range   Acetaminophen (Tylenol), Serum <10 (L) 10 - 30 ug/mL    Comment:        THERAPEUTIC CONCENTRATIONS VARY SIGNIFICANTLY. A RANGE OF 10-30 ug/mL MAY BE AN EFFECTIVE CONCENTRATION FOR MANY PATIENTS. HOWEVER, SOME ARE BEST TREATED AT CONCENTRATIONS OUTSIDE THIS RANGE. ACETAMINOPHEN CONCENTRATIONS >150 ug/mL AT 4 HOURS AFTER INGESTION AND >50 ug/mL AT 12 HOURS AFTER INGESTION ARE OFTEN ASSOCIATED WITH TOXIC REACTIONS.   CBC     Status: Abnormal   Collection Time: 08/04/15  9:07 PM  Result Value Ref Range   WBC 2.6 (L) 4.0 - 10.5 K/uL   RBC 4.82 4.22 - 5.81 MIL/uL   Hemoglobin 16.5 13.0 - 17.0 g/dL   HCT 45.6 39.0 - 52.0 %   MCV 94.6 78.0 - 100.0 fL   MCH 34.2 (H) 26.0 - 34.0 pg   MCHC 36.2 (H) 30.0 - 36.0 g/dL   RDW 13.4 11.5 - 15.5 %   Platelets 149 (L) 150 - 400 K/uL    Vitals: Blood pressure 130/83, pulse 65, temperature 98 F (36.7 C), temperature source Oral, resp. rate 18, height _0  (1.753 m), weight 86.637 kg (191 lb), SpO2 99 %.  Risk to Self: Suicidal Ideation:  Yes-Currently Present Suicidal Intent: Yes-Currently Present Is patient at risk for suicide?: Yes Suicidal Plan?: Yes-Currently Present Specify Current Suicidal Plan: Walk into traffic Access to Means: Yes Specify Access to Suicidal Means: Traffic in the road What has been your use of drugs/alcohol within the last 12 months?: ETOH & cocaine How many times?: 0 Other Self Harm Risks: None Triggers for Past Attempts: Unknown Intentional Self Injurious Behavior: None Risk to Others: Homicidal Ideation: No Thoughts of Harm to Others: No Current Homicidal Intent: No Current Homicidal Plan: No Access to Homicidal Means: No Identified Victim: No one History of harm to others?: No Assessment of Violence: None Noted Violent Behavior Description: Pt denies Does patient have access to weapons?: No Criminal Charges Pending?: No Does patient have a court date: No Prior Inpatient Therapy: Prior Inpatient Therapy: Yes Prior Therapy Dates: 2013-2015 Prior Therapy Facilty/Provider(s): [facility] multiple admits Reason for Treatment: SA, SI Prior Outpatient Therapy: Prior Outpatient Therapy: No Prior Therapy Dates: None currently Prior Therapy Facilty/Provider(s): None now Reason for Treatment: N/a Does patient have an ACCT team?: No Does patient have Intensive In-House Services?  : No Does patient have [facility] services? : No Does patient have P4CC services?: No  Current Facility-Administered Medications  Medication Dose Route Frequency Provider Last Rate Last Dose  . acetaminophen (TYLENOL) tablet 650 mg  650 mg Oral Q4H  PRN Junius Creamer, NP      . albuterol (PROVENTIL HFA;VENTOLIN HFA) 108 (90 BASE) MCG/ACT inhaler 2 puff  2 puff Inhalation Q4H PRN Junius Creamer, NP      . aspirin chewable tablet 81 mg  81 mg Oral q morning - 10a Junius Creamer, NP   81 mg at 08/05/15 0958  . buPROPion (WELLBUTRIN XL) 24 hr tablet 150 mg  150 mg Oral Daily Junius Creamer, NP   150 mg at 08/05/15 1041  . digoxin (LANOXIN)  tablet 125 mcg  125 mcg Oral Daily Junius Creamer, NP   125 mcg at 08/05/15 1035  . doxycycline (VIBRA-TABS) tablet 100 mg  100 mg Oral Q12H Junius Creamer, NP   100 mg at 08/05/15 0958  . emtricitabine-tenofovir (TRUVADA) 200-300 MG per tablet 1 tablet  1 tablet Oral Daily Junius Creamer, NP   1 tablet at 08/05/15 1034  . furosemide (LASIX) tablet 40 mg  40 mg Oral Daily Junius Creamer, NP   40 mg at 08/05/15 1036  . hydrOXYzine (ATARAX/VISTARIL) tablet 25-50 mg  25-50 mg Oral TID PRN Junius Creamer, NP      . ibuprofen (ADVIL,MOTRIN) tablet 600 mg  600 mg Oral Q8H PRN Junius Creamer, NP      . isosorbide dinitrate (ISORDIL) tablet 5 mg  5 mg Oral TID Junius Creamer, NP   5 mg at 08/05/15 1034  . lisinopril (PRINIVIL,ZESTRIL) tablet 2.5 mg  2.5 mg Oral Daily Junius Creamer, NP   2.5 mg at 08/05/15 1117  . LORazepam (ATIVAN) tablet 1 mg  1 mg Oral Q8H PRN Junius Creamer, NP      . metoprolol succinate (TOPROL-XL) 24 hr tablet 25 mg  25 mg Oral Daily Junius Creamer, NP   25 mg at 08/05/15 1117  . potassium chloride (K-DUR,KLOR-CON) CR tablet 10 mEq  10 mEq Oral Daily Junius Creamer, NP   10 mEq at 08/05/15 0958  . raltegravir (ISENTRESS) tablet 400 mg  400 mg Oral BID Junius Creamer, NP   400 mg at 08/05/15 1034  . risperiDONE (RISPERDAL) tablet 0.5 mg  0.5 mg Oral QHS Junius Creamer, NP   0.5 mg at 08/05/15 0019  . spironolactone (ALDACTONE) tablet 12.5 mg  12.5 mg Oral Daily Junius Creamer, NP   12.5 mg at 08/05/15 1034  . traZODone (DESYREL) tablet 100 mg  100 mg Oral QHS Junius Creamer, NP   100 mg at 08/05/15 0019  . valACYclovir (VALTREX) tablet 500 mg  500 mg Oral BID Junius Creamer, NP   500 mg at 08/05/15 1034  . zolpidem (AMBIEN) tablet 5 mg  5 mg Oral QHS PRN Junius Creamer, NP       Current Outpatient Prescriptions  Medication Sig Dispense Refill  . acetaminophen (TYLENOL) 500 MG tablet Take 1,000 mg by mouth every 6 (six) hours as needed for mild pain.    Marland Kitchen albuterol (PROVENTIL HFA;VENTOLIN HFA) 108 (90 BASE) MCG/ACT inhaler Inhale 2  puffs into the lungs every 4 (four) hours as needed for shortness of breath.    Marland Kitchen aspirin 81 MG chewable tablet Chew 1 tablet (81 mg total) by mouth every morning. For heart health    . buPROPion (WELLBUTRIN XL) 150 MG 24 hr tablet Take 1 tablet (150 mg total) by mouth daily. For depression 30 tablet 0  . digoxin (LANOXIN) 0.125 MG tablet Take 1 tablet (125 mcg total) by mouth daily. For control of heart arrythmia.    Marland Kitchen emtricitabine-tenofovir (TRUVADA) 200-300 MG per tablet  Take 1 tablet by mouth daily. For HIV infection 30 tablet 6  . furosemide (LASIX) 40 MG tablet Take 1 tablet (40 mg total) by mouth daily. For edema 30 tablet 0  . ibuprofen (ADVIL,MOTRIN) 200 MG tablet Take 400 mg by mouth every 6 (six) hours as needed for moderate pain.    . isosorbide dinitrate (ISORDIL) 5 MG tablet Take 1 tablet (5 mg total) by mouth 3 (three) times daily. For heart condition 90 tablet 0  . lisinopril (PRINIVIL,ZESTRIL) 2.5 MG tablet Take 1 tablet (2.5 mg total) by mouth daily. For high blood pressure    . metoprolol succinate (TOPROL-XL) 25 MG 24 hr tablet Take 1 tablet (25 mg total) by mouth daily. For high blood pressure    . Multiple Vitamin (MULTI-VITAMINS) TABS Take 1 tablet by mouth daily. For low vitamin 30 tablet   . nicotine (RA NICOTINE) 21 mg/24hr patch Place 1 patch (21 mg total) onto the skin daily. For nicotine addiction 28 patch 0  . potassium chloride (K-DUR,KLOR-CON) 10 MEQ tablet Take 1 tablet (10 mEq total) by mouth daily. For low potassium 30 tablet 0  . raltegravir (ISENTRESS) 400 MG tablet Take 1 tablet (400 mg total) by mouth 2 (two) times daily. For HIV infection 60 tablet 6  . risperiDONE (RISPERDAL) 0.5 MG tablet Take 1 tablet (0.5 mg total) by mouth at bedtime. For mood control 30 tablet 0  . spironolactone (ALDACTONE) 25 MG tablet Take 0.5 tablets (12.5 mg total) by mouth daily. For heart condition 30 tablet 0  . traZODone (DESYREL) 100 MG tablet Take 1 tablet (100 mg total) by  mouth at bedtime. For sleep 30 tablet 0  . valACYclovir (VALTREX) 500 MG tablet Take 1 tablet (500 mg total) by mouth 2 (two) times daily. For Herpes.    Marland Kitchen azithromycin (ZITHROMAX) 250 MG tablet Take 4 tablets (1,000 mg total) by mouth once. Take first 2 tablets together, then 1 every day until finished. (Patient not taking: Reported on 08/04/2015) 4 tablet 0  . doxycycline (VIBRAMYCIN) 100 MG capsule Take 1 capsule (100 mg total) by mouth 2 (two) times daily. (Patient not taking: Reported on 08/04/2015) 20 capsule 0    Musculoskeletal: Strength & Muscle Tone: within normal limits Gait & Station: normal Patient leans: N/A  Psychiatric Specialty Exam: Physical Exam  Review of Systems  Constitutional: Negative.   HENT: Negative.   Eyes: Negative.   Respiratory: Negative.   Cardiovascular: Negative.   Gastrointestinal: Negative.   Genitourinary: Negative.   Musculoskeletal: Negative.   Skin: Negative.   Neurological: Negative.   Endo/Heme/Allergies: Negative.     Blood pressure 130/83, pulse 65, temperature 98 F (36.7 C), temperature source Oral, resp. rate 18, height _0  (1.753 m), weight 86.637 kg (191 lb), SpO2 99 %.Body mass index is 28.19 kg/(m^2).  General Appearance: Casual  Eye Contact::  Minimal  Speech:  Clear and Coherent and Normal Rate  Volume:  Normal  Mood:  Anxious, Depressed, Hopeless and HELPLESS  Affect:  Congruent, Depressed and Flat  Thought Process:  Coherent, Goal Directed and Intact  Orientation:  Full (Time, Place, and Person)  Thought Content:  WDL  Suicidal Thoughts:  Yes.  without intent/plan  Homicidal Thoughts:  No  Memory:  Immediate;   Good Recent;   Good Remote;   Good  Judgement:  Fair  Insight:  Fair  Psychomotor Activity:  Psychomotor Retardation  Concentration:  Good  Recall:  NA  Fund of Knowledge:Fair  Language: Good  Akathisia:  NA  Handed:  Right  AIMS (if indicated):     Assets:  Desire for Improvement  ADL's:  Intact   Cognition: WNL  Sleep:      Medical Decision Making: Review of Psycho-Social Stressors (1)  Treatment Plan Summary: Daily contact with patient to assess and evaluate symptoms and progress in treatment and Medication management  Plan:  Resume all home medications. Disposition: Admit, seek placement  Delfin Gant   PMHNP-BC 08/05/2015 1:31 PM Patient seen face-to-face for psychiatric evaluation, chart reviewed and case discussed with the physician extender and developed treatment plan. Reviewed the information documented and agree with the treatment plan. Corena Pilgrim, MD

## 2015-08-05 NOTE — ED Notes (Signed)
Oriented pt to room and unit.  Pt has suicidal ideation but contracts for safety.  He denies HI and AVH.  Pt is alert and oriented.  Denies pain or discomfort.  15 minute checks in place.

## 2015-08-05 NOTE — BH Assessment (Signed)
[facility] Assessment Progress Note  The following facilities have been contacted to seek placement for this pt, with results as noted:  Beds available, information sent, decision pending:  [place] [facility] [place] [facility]  At capacity:  [place] [facility] [place]   [place], [place] Triage Specialist [phone]
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
## 2015-08-05 NOTE — ED Notes (Signed)
Patient appears flat. Denies HI, AVH. Reports SI. Rates anxiety and feelings of depression 8/10. Reports trouble falling and staying asleep in recent weeks.   Encouragement offered.  Q 15 safety checks continue.

## 2015-08-06 ENCOUNTER — Encounter (HOSPITAL_COMMUNITY): Payer: Self-pay

## 2015-08-06 ENCOUNTER — Inpatient Hospital Stay (HOSPITAL_COMMUNITY)
Admission: AD | Admit: 2015-08-06 | Discharge: 2015-08-12 | DRG: 885 | Disposition: A | Discharge status: Home / Self Care | Payer: Medicaid Other | Source: Intra-hospital | Attending: Psychiatry | Admitting: Psychiatry

## 2015-08-06 ENCOUNTER — Encounter (HOSPITAL_COMMUNITY): Payer: Self-pay | Admitting: Registered Nurse

## 2015-08-06 DIAGNOSIS — B20 Human immunodeficiency virus [HIV] disease: Secondary | ICD-10-CM | POA: Diagnosis present

## 2015-08-06 DIAGNOSIS — F332 Major depressive disorder, recurrent severe without psychotic features: Secondary | ICD-10-CM | POA: Diagnosis present

## 2015-08-06 DIAGNOSIS — Z8249 Family history of ischemic heart disease and other diseases of the circulatory system: Secondary | ICD-10-CM

## 2015-08-06 DIAGNOSIS — F333 Major depressive disorder, recurrent, severe with psychotic symptoms: Secondary | ICD-10-CM | POA: Diagnosis present

## 2015-08-06 DIAGNOSIS — F17210 Nicotine dependence, cigarettes, uncomplicated: Secondary | ICD-10-CM | POA: Diagnosis present

## 2015-08-06 DIAGNOSIS — J449 Chronic obstructive pulmonary disease, unspecified: Secondary | ICD-10-CM | POA: Diagnosis present

## 2015-08-06 DIAGNOSIS — Z79899 Other long term (current) drug therapy: Secondary | ICD-10-CM

## 2015-08-06 DIAGNOSIS — I5022 Chronic systolic (congestive) heart failure: Secondary | ICD-10-CM | POA: Diagnosis present

## 2015-08-06 DIAGNOSIS — Z7951 Long term (current) use of inhaled steroids: Secondary | ICD-10-CM | POA: Diagnosis not present

## 2015-08-06 DIAGNOSIS — Z590 Homelessness: Secondary | ICD-10-CM | POA: Diagnosis not present

## 2015-08-06 DIAGNOSIS — Z7982 Long term (current) use of aspirin: Secondary | ICD-10-CM | POA: Diagnosis not present

## 2015-08-06 DIAGNOSIS — Z9114 Patient's other noncompliance with medication regimen: Secondary | ICD-10-CM | POA: Diagnosis present

## 2015-08-06 DIAGNOSIS — F1020 Alcohol dependence, uncomplicated: Secondary | ICD-10-CM | POA: Diagnosis present

## 2015-08-06 DIAGNOSIS — F329 Major depressive disorder, single episode, unspecified: Secondary | ICD-10-CM | POA: Diagnosis not present

## 2015-08-06 DIAGNOSIS — F1420 Cocaine dependence, uncomplicated: Secondary | ICD-10-CM | POA: Diagnosis present

## 2015-08-06 DIAGNOSIS — F17200 Nicotine dependence, unspecified, uncomplicated: Secondary | ICD-10-CM | POA: Diagnosis present

## 2015-08-06 DIAGNOSIS — I10 Essential (primary) hypertension: Secondary | ICD-10-CM | POA: Diagnosis present

## 2015-08-06 DIAGNOSIS — R45851 Suicidal ideations: Secondary | ICD-10-CM | POA: Diagnosis present

## 2015-08-06 DIAGNOSIS — F4310 Post-traumatic stress disorder, unspecified: Secondary | ICD-10-CM | POA: Diagnosis not present

## 2015-08-06 MED ORDER — MAGNESIUM HYDROXIDE 400 MG/5ML PO SUSP: 30.0000 mL | Freq: Every day | ORAL | Status: DC | PRN

## 2015-08-06 MED ORDER — HYDROXYZINE HCL 25 MG PO TABS
25.0000 mg | ORAL_TABLET | Freq: Three times a day (TID) | ORAL | Status: DC | PRN
  Administered 2015-08-07 – 2015-08-11 (×7): 25 mg via ORAL
  Filled 2015-08-06 (×6): qty 1
  Filled 2015-08-06: qty 20
  Filled 2015-08-06 (×2): qty 1

## 2015-08-06 MED ORDER — METOPROLOL SUCCINATE ER 25 MG PO TB24
25.0000 mg | ORAL_TABLET | Freq: Every day | ORAL | Status: DC
  Administered 2015-08-07 – 2015-08-12 (×6): 25 mg via ORAL
  Filled 2015-08-06 (×8): qty 1

## 2015-08-06 MED ORDER — ASPIRIN 81 MG PO CHEW
81.0000 mg | CHEWABLE_TABLET | Freq: Every morning | ORAL | Status: DC
  Administered 2015-08-07 – 2015-08-12 (×6): 81 mg via ORAL
  Filled 2015-08-06 (×8): qty 1

## 2015-08-06 MED ORDER — FUROSEMIDE 40 MG PO TABS
40.0000 mg | ORAL_TABLET | Freq: Every day | ORAL | Status: DC
  Administered 2015-08-07 – 2015-08-12 (×6): 40 mg via ORAL
  Filled 2015-08-06 (×8): qty 1

## 2015-08-06 MED ORDER — ALUM & MAG HYDROXIDE-SIMETH 200-200-20 MG/5ML PO SUSP: 30.0000 mL | ORAL | Status: DC | PRN

## 2015-08-06 MED ORDER — DIGOXIN 125 MCG PO TABS
125.0000 ug | ORAL_TABLET | Freq: Every day | ORAL | Status: DC
  Administered 2015-08-07 – 2015-08-12 (×6): 125 ug via ORAL
  Filled 2015-08-06 (×8): qty 1

## 2015-08-06 MED ORDER — BUPROPION HCL ER (XL) 150 MG PO TB24
150.0000 mg | ORAL_TABLET | Freq: Every day | ORAL | Status: DC
  Administered 2015-08-07 – 2015-08-12 (×6): 150 mg via ORAL
  Filled 2015-08-06 (×6): qty 1
  Filled 2015-08-06: qty 14
  Filled 2015-08-06: qty 1

## 2015-08-06 MED ORDER — RALTEGRAVIR POTASSIUM 400 MG PO TABS
400.0000 mg | ORAL_TABLET | Freq: Two times a day (BID) | ORAL | Status: DC
  Administered 2015-08-06 – 2015-08-12 (×12): 400 mg via ORAL
  Filled 2015-08-06 (×16): qty 1

## 2015-08-06 MED ORDER — ALBUTEROL SULFATE HFA 108 (90 BASE) MCG/ACT IN AERS: 2.0000 | INHALATION_SPRAY | RESPIRATORY_TRACT | Status: DC | PRN

## 2015-08-06 MED ORDER — SPIRONOLACTONE 12.5 MG HALF TABLET
12.5000 mg | ORAL_TABLET | Freq: Every day | ORAL | Status: DC
  Administered 2015-08-07 – 2015-08-12 (×6): 12.5 mg via ORAL
  Filled 2015-08-06 (×8): qty 1

## 2015-08-06 MED ORDER — ISOSORBIDE DINITRATE 5 MG PO TABS
5.0000 mg | ORAL_TABLET | Freq: Three times a day (TID) | ORAL | Status: DC
  Administered 2015-08-07 – 2015-08-12 (×16): 5 mg via ORAL
  Filled 2015-08-06 (×22): qty 1

## 2015-08-06 MED ORDER — TRAZODONE HCL 100 MG PO TABS
100.0000 mg | ORAL_TABLET | Freq: Every day | ORAL | Status: DC
  Administered 2015-08-06 – 2015-08-08 (×3): 100 mg via ORAL
  Filled 2015-08-06 (×6): qty 1

## 2015-08-06 MED ORDER — EMTRICITABINE-TENOFOVIR DF 200-300 MG PO TABS
1.0000 | ORAL_TABLET | Freq: Every day | ORAL | Status: DC
  Administered 2015-08-07 – 2015-08-12 (×6): 1 via ORAL
  Filled 2015-08-06 (×8): qty 1

## 2015-08-06 MED ORDER — DOXYCYCLINE HYCLATE 100 MG PO TABS
100.0000 mg | ORAL_TABLET | Freq: Two times a day (BID) | ORAL | Status: AC
  Administered 2015-08-06 – 2015-08-11 (×10): 100 mg via ORAL
  Filled 2015-08-06 (×11): qty 1

## 2015-08-06 MED ORDER — VALACYCLOVIR HCL 500 MG PO TABS
500.0000 mg | ORAL_TABLET | Freq: Two times a day (BID) | ORAL | Status: DC
  Administered 2015-08-06 – 2015-08-12 (×12): 500 mg via ORAL
  Filled 2015-08-06 (×11): qty 1
  Filled 2015-08-06 (×2): qty 6
  Filled 2015-08-06 (×4): qty 1

## 2015-08-06 MED ORDER — LISINOPRIL 2.5 MG PO TABS
2.5000 mg | ORAL_TABLET | Freq: Every day | ORAL | Status: DC
  Administered 2015-08-07 – 2015-08-12 (×6): 2.5 mg via ORAL
  Filled 2015-08-06 (×8): qty 1

## 2015-08-06 MED ORDER — RISPERIDONE 0.5 MG PO TABS
0.5000 mg | ORAL_TABLET | Freq: Every day | ORAL | Status: DC
  Administered 2015-08-06 – 2015-08-07 (×2): 0.5 mg via ORAL
  Filled 2015-08-06 (×5): qty 1

## 2015-08-06 MED ORDER — POTASSIUM CHLORIDE CRYS ER 10 MEQ PO TBCR
10.0000 meq | EXTENDED_RELEASE_TABLET | Freq: Every day | ORAL | Status: DC
  Administered 2015-08-07 – 2015-08-12 (×6): 10 meq via ORAL
  Filled 2015-08-06 (×8): qty 1

## 2015-08-06 NOTE — Progress Notes (Signed)
Patient ID: Ryan Long, male   DOB: 1961/05/03, 54 y.o.   MRN: [id number]  Patient is a 54 yr old voluntary admission that came in with c/o increased depression and SI. He reports that the housing authority got him an apartment but it was infested with bed bugs and he couldn't stay there. He reports he left everything there due to afraid he would take the bed bugs with him. Now he has no belongings or place to live. He has been off meds for a while and wanted to get back on them. He is looking for possible placement somewhere. He denied recent use of any drugs but he was positive for cocaine. He has a long hx medical problems including: HTN; CHF; HIV: Bipolar: and PTSD. Passive SI but contracts for safety.

## 2015-08-06 NOTE — Consult Note (Signed)
  Patient was accepted to [facility] 302/20  Shuvon B. Rankin FNP-BC  Patient seen face-to-face for psychiatric evaluation, chart reviewed and case discussed with the physician extender and developed treatment plan. Reviewed the information documented and agree with the treatment plan. Thedore Mins, MD

## 2015-08-06 NOTE — Progress Notes (Signed)
pcp is [address] [place], [place] [address] [phone]

## 2015-08-06 NOTE — Progress Notes (Signed)
Ryan Long attended but did not speak once called upon.

## 2015-08-06 NOTE — BH Assessment (Signed)
[facility] Assessment Progress Note  Per Thedore Mins, MD, this pt requires psychiatric hospitalization at this time.  Thurman Coyer, RN, [facility] has assigned pt to [facility] 302-2.  Pt has signed Voluntary Admission and Consent for Treatment, as well as Consent to Release Information, and signed forms have been faxed to [facility].  Pt's nurse has been notified, and agrees to send original paperwork along with pt via [place], and to call report to [phone].  Doylene Canning, MA Triage Specialist [phone]

## 2015-08-06 NOTE — Tx Team (Signed)
Initial Interdisciplinary Treatment Plan   PATIENT STRESSORS: Financial difficulties Health problems Loss of apartment   PATIENT STRENGTHS: Ability for insight Average or above average intelligence Capable of independent living Communication skills Financial means   PROBLEM LIST: Problem List/Patient Goals Date to be addressed Date deferred Reason deferred Estimated date of resolution  " I need to work on my mood" 08/06/15     " I need help with housing" 08/06/15                                                DISCHARGE CRITERIA:  Ability to meet basic life and health needs Adequate post-discharge living arrangements Improved stabilization in mood, thinking, and/or behavior  PRELIMINARY DISCHARGE PLAN: Outpatient therapy Placement in alternative living arrangements  PATIENT/FAMIILY INVOLVEMENT: This treatment plan has been presented to and reviewed with the patient, Ryan Long, and/or family member, .  The patient and family have been given the opportunity to ask questions and make suggestions.  Ryan Long [facility] 08/06/2015, 8:29 PM

## 2015-08-06 NOTE — BH Assessment (Signed)
Patient was reassessed 08/06/2015  Patient states that he is present due to feeling like he wants to kill himself. Patient states that he is still suicidal and has been for the past few days.. Patient states that he is not currently homicidal and denies psychosis. Patient did not appear to be responding to internal stimuli and states that he would like treatment.  Inpatient is recommended by Dr. Jannifer Franklin and Assunta Found, NP.

## 2015-08-07 ENCOUNTER — Encounter (HOSPITAL_COMMUNITY): Payer: Self-pay | Admitting: Psychiatry

## 2015-08-07 DIAGNOSIS — F4310 Post-traumatic stress disorder, unspecified: Secondary | ICD-10-CM | POA: Diagnosis present

## 2015-08-07 DIAGNOSIS — F333 Major depressive disorder, recurrent, severe with psychotic symptoms: Secondary | ICD-10-CM | POA: Diagnosis present

## 2015-08-07 DIAGNOSIS — R45851 Suicidal ideations: Secondary | ICD-10-CM

## 2015-08-07 DIAGNOSIS — F1020 Alcohol dependence, uncomplicated: Secondary | ICD-10-CM

## 2015-08-07 DIAGNOSIS — Z720 Tobacco use: Secondary | ICD-10-CM

## 2015-08-07 DIAGNOSIS — F1420 Cocaine dependence, uncomplicated: Secondary | ICD-10-CM | POA: Diagnosis present

## 2015-08-07 MED ORDER — NICOTINE 21 MG/24HR TD PT24
21.0000 mg | MEDICATED_PATCH | Freq: Every day | TRANSDERMAL | Status: DC
  Administered 2015-08-07 – 2015-08-12 (×6): 21 mg via TRANSDERMAL
  Filled 2015-08-07 (×2): qty 1
  Filled 2015-08-07: qty 14
  Filled 2015-08-07 (×5): qty 1

## 2015-08-07 NOTE — Psychosocial Assessment (Signed)
Adult Psychoeducational Group Note  Date:  08/07/2015 Time:  8:00PM Group Topic/Focus:  Wrap-Up Group:   The focus of this group is to help patients review their daily goal of treatment and discuss progress on daily workbooks.  Participation Level:  Active  Participation Quality:  Appropriate and Attentive  Affect:  Appropriate  Cognitive:  Alert and Appropriate  Insight: Appropriate  Engagement in Group:  Engaged  Modes of Intervention:  Discussion  Additional Comments:  Pt. Was appropriate and attentive during tonight's warp up group with AA.   Bing Plume D 08/07/2015, 10:28 PM
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
## 2015-08-07 NOTE — Progress Notes (Signed)
Psychoeducational Group Note  Date: 08/07/2015 Time: 1015  Group Topic/Focus:  Identifying Needs:   The focus of this group is to help patients identify their personal needs that have been historically problematic and identify healthy behaviors to address their needs.  Participation Level:  Active  Participation Quality:  Appropriate  Affect:  Appropriate  Cognitive:  Alert  Insight:  Engaged  Engagement in Group:  Engaged  Additional Comments:    8/20/20164:24 PM Clariza Sickman, Joie Bimler

## 2015-08-07 NOTE — [facility] Group Notes (Signed)
[facility] Group Notes:  (Clinical Social Work)  08/07/2015     10-11AM  Summary of Progress/Problems:   The main focus of today's process group was to learn how to use a decisional balance exercise to move forward in the Stages of Change, which were described and discussed.  Patients listed needs on the whiteboard and unhealthy coping techniques often used to fill needs.  Motivational Interviewing and the whiteboard were utilized to help patients explore in depth the perceived benefits and costs of unhealthy coping techniques, as well as the  benefits and costs of replacing that with a healthy coping skills.  A handout was distributed for patients to be able to do this exercise for themselves.   The patient expressed that their own unhealthy coping involves staying in unhealthy relationships, hanging out with friends and in places he should not do.  Type of Therapy:  Group Therapy - Process   Participation Level:  Active  Participation Quality:  Attentive and Sharing  Affect:  Blunted  Cognitive:  Appropriate  Insight:  Developing/Improving  Engagement in Therapy:  Engaged  Modes of Intervention:  Education, Motivational Interviewing  Ambrose Mantle, LCSW 08/07/2015, 12:43 PM

## 2015-08-07 NOTE — [facility] Counselor (Signed)
Attempt to complete PSA with patient was unsuccessful as pt was attending group therapy with CSW staff at 10:55 AM  Catherine C Harrill, LCSW   

## 2015-08-07 NOTE — [facility] Group Notes (Signed)
[facility] Group Notes:  (Nursing/MHT/Case Management/Adjunct)  Date:  08/07/2015  Time:  10:13 AM  Type of Therapy:  Nurse Education /  Goals Group : The group is focused on teaching patients how to set attainable goals as well as develop skills needed to accomplish these goals.  Participation Level:  Active  Participation Quality:  Appropriate  Affect:  Appropriate  Cognitive:  Appropriate  Insight:  Appropriate  Engagement in Group:  Engaged  Modes of Intervention:  Education  Summary of Progress/Problems:  Ryan Long 08/07/2015, 10:13 AM

## 2015-08-07 NOTE — H&P (Signed)
Psychiatric Admission Assessment Adult  Patient Identification: Ryan Long MRN:  [id number] Date of Evaluation:  08/07/2015 Chief Complaint:  MDD PTSD SUBSTANCE ABUSE Principal Diagnosis: MDD (major depressive disorder), recurrent, severe, with psychosis Diagnosis:   Patient Active Problem List   Diagnosis Date Noted  . MDD (major depressive disorder), recurrent, severe, with psychosis [F33.3] 08/07/2015  . PTSD (post-traumatic stress disorder) [F43.10] 08/07/2015  . Cocaine use disorder, severe, dependence [F14.20] 08/07/2015  . Alcohol use disorder, severe, dependence [F10.20] 08/07/2015  . Tobacco use disorder [Z72.0] 08/07/2015  . Suicidal ideation [R45.851]   . Polysubstance (excluding opioids) dependence with physiological dependence [F19.20] 12/30/2013  . Influenza with other respiratory manifestations [J11.89] 12/22/2013  . Substance induced mood disorder [F19.94] 11/22/2013  . Cocaine abuse, episodic use [F14.10] 03/20/2012    Class: Acute  . Alcohol dependence [F10.20] 03/20/2012    Class: Acute  . Chronic systolic heart failure [A76.81]   . Cardiomyopathy, nonischemic [I42.9] 08/24/2011  . HIV DISEASE [B20] 02/01/2009  . HSV [B00.9] 02/01/2009  . HYPERLIPIDEMIA [E78.5] 02/01/2009  . ALLERGIC RHINITIS [J30.9] 02/01/2009   History of Present Illness: Ryan Long, 54 y.o. male patient admitted with Substance abuse and worsening depression.  He was at [facility] last Sept 2015 for same.  He had reported several stressors such as homelessness, non compliance with medications both for mental health and his HIV.  He states he had been thinking of killing himself by stepping in front of a truck.  He denies HI or A/V hallucinations. He does report using ETOH and cocaine about two days ago.   Patient does not have outpatient [place] provider.  Furthermore, he has been off medications for the last two weeks or so. He reportedly was getting medications filled by [facility] Patient  has been at [facility] multiple times in the last couple years.   HPI Elements: Location: Substance induced mood disorder, MDD, recurrent, severe. Quality: severe, feels suicidal, hopeless and helpless. Severity: severe. Timing: acute. Duration: Chronic mental illness. Context: BROUGHT SELF IN SEEKING TREATMENT FOR DEPRESSION.  Associated Signs/Symptoms: Depression Symptoms:  anhedonia, hopelessness, suicidal attempt, anxiety, (Hypo) Manic Symptoms:  Impulsivity, Irritable Mood, Labiality of Mood, Anxiety Symptoms:  Excessive Worry, Psychotic Symptoms:  NA PTSD Symptoms: NA  Total Time spent with patient: 45 minutes  Past Medical History:  Past Medical History  Diagnosis Date  . HIV (human immunodeficiency virus infection)   . Chronic systolic heart failure   . Hypertension   . Genital herpes   . Active smoker   . AR (allergic rhinitis)   . HLD (hyperlipidemia)   . NICM (nonischemic cardiomyopathy)     Echocardiogram 06/28/11: EF 30-35%, mild MR, mild LAE;  No CAD by coronary CT angiogram 3/12 at [facility]  . NSVT (nonsustained ventricular tachycardia)   . Depression   . Bipolar 1 disorder   . Alcohol abuse   . Crack cocaine use   . COPD (chronic obstructive pulmonary disease)   . CHF (congestive heart failure)   . Anxiety   . PTSD (post-traumatic stress disorder)    History reviewed. No pertinent past surgical history. Family History:  Family History  Problem Relation Age of Onset  . Hypertension Father   . CAD Father    Social History:  History  Alcohol Use  . Yes    Comment: sporadic; approximately every 2 -3 weeks     History  Drug Use  . Yes  . Special: Cocaine    Comment: reports no recent use but  hx of THC and cocaine    Social History   Social History  . Marital Status: Single    Spouse Name: N/A  . Number of Children: N/A  . Years of Education: N/A   Social History Main Topics  . Smoking status: Current Every Day  Smoker -- 1.00 packs/day for 30 years    Types: Cigarettes  . Smokeless tobacco: Never Used     Comment: 15 cigarettes daily  . Alcohol Use: Yes     Comment: sporadic; approximately every 2 -3 weeks  . Drug Use: Yes    Special: Cocaine     Comment: reports no recent use but hx of THC and cocaine  . Sexual Activity: Yes    Birth Control/ Protection: Condom     Comment: pt. given condoms   Other Topics Concern  . None   Social History Narrative   Additional Social History:   Musculoskeletal: Strength & Muscle Tone: within normal limits Gait & Station: normal Patient leans: N/A  Psychiatric Specialty Exam: Physical Exam  Vitals reviewed.   Review of Systems  All other systems reviewed and are negative.   Blood pressure 111/71, pulse 81, temperature 98.1 F (36.7 C).There is no weight on file to calculate BMI.   General Appearance: Casual  Eye Contact:: Fair  Speech: Clear and Coherent  Volume: Normal  Mood: Anxious and Depressed  Affect: Depressed  Thought Process: Coherent  Orientation: Full (Time, Place, and Person)  Thought Content: Hallucinations: Visual  Suicidal Thoughts: Yes. with intent/plan  Homicidal Thoughts: No  Memory: Immediate; Fair Recent; Fair Remote; Fair  Judgement: Impaired  Insight: Fair  Psychomotor Activity: Normal  Concentration: Fair  Recall: [employer] of Knowledge:Fair  Language: Fair  Akathisia: No  Handed: Right  AIMS (if indicated):    Assets: Communication Skills Desire for Improvement  Sleep:  7  Cognition: WNL  ADL's: Intact       Risk to Self: Is patient at risk for suicide?: Yes What has been your use of drugs/alcohol within the last 12 months?: Alcohol and crack cocaine binges on monthly basis, will use heavy for several days until his money runs out; last use occurred Mon 8/15 through [place] 8/17 Risk to Others:   Prior Inpatient Therapy:   Prior Outpatient  Therapy:    Alcohol Screening: 1. How often do you have a drink containing alcohol?: Monthly or less 2. How many drinks containing alcohol do you have on a typical day when you are drinking?: 1 or 2 3. How often do you have six or more drinks on one occasion?: Never Preliminary Score: 0 9. Have you or someone else been injured as a result of your drinking?: No 10. Has a relative or friend or a doctor or another health worker been concerned about your drinking or suggested you cut down?: No Alcohol Use Disorder Identification Test Final Score (AUDIT): 1 Brief Intervention: Patient declined brief intervention  Allergies:  No Known Allergies Lab Results: No results found for this or any previous visit (from the past 99 hour(s)). Current Medications: Current Facility-Administered Medications  Medication Dose Route Frequency Provider Last Rate Last Dose  . albuterol (PROVENTIL HFA;VENTOLIN HFA) 108 (90 BASE) MCG/ACT inhaler 2 puff  2 puff Inhalation Q4H PRN Shuvon B Rankin, NP      . alum & mag hydroxide-simeth (MAALOX/MYLANTA) 200-200-20 MG/5ML suspension 30 mL  30 mL Oral Q4H PRN Shuvon B Rankin, NP      . aspirin chewable tablet 81  mg  81 mg Oral q morning - 10a Shuvon B Rankin, NP   81 mg at 08/07/15 0836  . buPROPion (WELLBUTRIN XL) 24 hr tablet 150 mg  150 mg Oral Daily Shuvon B Rankin, NP   150 mg at 08/07/15 0835  . digoxin (LANOXIN) tablet 125 mcg  125 mcg Oral Daily Shuvon B Rankin, NP   125 mcg at 08/07/15 0835  . doxycycline (VIBRA-TABS) tablet 100 mg  100 mg Oral Q12H Shuvon B Rankin, NP   100 mg at 08/07/15 0835  . emtricitabine-tenofovir (TRUVADA) 200-300 MG per tablet 1 tablet  1 tablet Oral Daily Shuvon B Rankin, NP   1 tablet at 08/07/15 0834  . furosemide (LASIX) tablet 40 mg  40 mg Oral Daily Shuvon B Rankin, NP   40 mg at 08/07/15 0834  . hydrOXYzine (ATARAX/VISTARIL) tablet 25 mg  25 mg Oral TID PRN Shuvon B Rankin, NP   25 mg at 08/07/15 0843  . isosorbide dinitrate  (ISORDIL) tablet 5 mg  5 mg Oral TID Shuvon B Rankin, NP   5 mg at 08/07/15 1304  . lisinopril (PRINIVIL,ZESTRIL) tablet 2.5 mg  2.5 mg Oral Daily Shuvon B Rankin, NP   2.5 mg at 08/07/15 0834  . magnesium hydroxide (MILK OF MAGNESIA) suspension 30 mL  30 mL Oral Daily PRN Shuvon B Rankin, NP      . metoprolol succinate (TOPROL-XL) 24 hr tablet 25 mg  25 mg Oral Daily Shuvon B Rankin, NP   25 mg at 08/07/15 0834  . nicotine (NICODERM CQ - dosed in mg/24 hours) patch 21 mg  21 mg Transdermal Daily Nicholaus Bloom, MD   21 mg at 08/07/15 0834  . potassium chloride (K-DUR,KLOR-CON) CR tablet 10 mEq  10 mEq Oral Daily Shuvon B Rankin, NP   10 mEq at 08/07/15 0835  . raltegravir (ISENTRESS) tablet 400 mg  400 mg Oral BID Shuvon B Rankin, NP   400 mg at 08/07/15 0835  . risperiDONE (RISPERDAL) tablet 0.5 mg  0.5 mg Oral QHS Shuvon B Rankin, NP   0.5 mg at 08/06/15 2137  . spironolactone (ALDACTONE) tablet 12.5 mg  12.5 mg Oral Daily Shuvon B Rankin, NP   12.5 mg at 08/07/15 0835  . traZODone (DESYREL) tablet 100 mg  100 mg Oral QHS Shuvon B Rankin, NP   100 mg at 08/06/15 2137  . valACYclovir (VALTREX) tablet 500 mg  500 mg Oral BID Shuvon B Rankin, NP   500 mg at 08/07/15 0762   PTA Medications: Prescriptions prior to admission  Medication Sig Dispense Refill Last Dose  . albuterol (VENTOLIN HFA) 108 (90 BASE) MCG/ACT inhaler Inhale 2 puffs into the lungs.     Marland Kitchen buPROPion (WELLBUTRIN SR) 150 MG 12 hr tablet Take 150 mg by mouth.     Marland Kitchen buPROPion (WELLBUTRIN) 100 MG tablet Take 100 mg by mouth.     . digoxin (LANOXIN) 0.125 MG tablet Take 125 mcg by mouth.     Marland Kitchen emtricitabine-tenofovir (TRUVADA) 200-300 MG per tablet Take 1 tablet by mouth.     . furosemide (LASIX) 40 MG tablet Take 40 mg by mouth.     . isosorbide dinitrate (ISORDIL) 10 MG tablet Take 5 mg by mouth.     . isosorbide dinitrate (ISORDIL) 5 MG tablet Take 5 mg by mouth.     Marland Kitchen lisinopril (PRINIVIL,ZESTRIL) 2.5 MG tablet Take 2.5 mg by  mouth.     Marland Kitchen lisinopril (PRINIVIL,ZESTRIL) 5 MG tablet  Take 2.5 mg by mouth.     . metoprolol succinate (TOPROL XL) 25 MG 24 hr tablet Take 25 mg by mouth.     . Multiple Vitamin (THERA) TABS Take 1 tablet by mouth.     . nicotine (RA NICOTINE) 21 mg/24hr patch Place onto the skin.     . raltegravir (ISENTRESS) 400 MG tablet Take 400 mg by mouth.     . traZODone (DESYREL) 100 MG tablet Take 200 mg by mouth.     . valACYclovir (VALTREX) 500 MG tablet Take 500 mg by mouth.     Marland Kitchen acetaminophen (TYLENOL) 500 MG tablet Take 1,000 mg by mouth every 6 (six) hours as needed for mild pain.   Past Month at Unknown time  . albuterol (PROVENTIL HFA;VENTOLIN HFA) 108 (90 BASE) MCG/ACT inhaler Inhale 2 puffs into the lungs every 4 (four) hours as needed for shortness of breath.   08/04/2015 at Unknown time  . aspirin 81 MG chewable tablet Chew 1 tablet (81 mg total) by mouth every morning. For heart health   08/04/2015 at Unknown time  . buPROPion (WELLBUTRIN XL) 150 MG 24 hr tablet Take 1 tablet (150 mg total) by mouth daily. For depression 30 tablet 0 Past Month at Unknown time  . digoxin (LANOXIN) 0.125 MG tablet Take 1 tablet (125 mcg total) by mouth daily. For control of heart arrythmia.   08/04/2015 at Unknown time  . doxycycline (VIBRAMYCIN) 100 MG capsule TAKE ONE CAPSULE BY MOUTH TWICE A DAY UNTIL FINISHED  0   . emtricitabine-tenofovir (TRUVADA) 200-300 MG per tablet Take 1 tablet by mouth daily. For HIV infection 30 tablet 6 08/04/2015 at Unknown time  . furosemide (LASIX) 40 MG tablet Take 1 tablet (40 mg total) by mouth daily. For edema 30 tablet 0 Past Week at Unknown time  . ibuprofen (ADVIL,MOTRIN) 200 MG tablet Take 400 mg by mouth every 6 (six) hours as needed for moderate pain.   Past Week at Unknown time  . isosorbide dinitrate (ISORDIL) 5 MG tablet Take 1 tablet (5 mg total) by mouth 3 (three) times daily. For heart condition 90 tablet 0 08/04/2015 at Unknown time  . lisinopril (PRINIVIL,ZESTRIL)  2.5 MG tablet Take 1 tablet (2.5 mg total) by mouth daily. For high blood pressure   08/04/2015 at Unknown time  . metoprolol succinate (TOPROL-XL) 25 MG 24 hr tablet Take 1 tablet (25 mg total) by mouth daily. For high blood pressure   08/04/2015 at 0800  . Multiple Vitamin (MULTI-VITAMINS) TABS Take 1 tablet by mouth daily. For low vitamin 30 tablet  08/04/2015 at Unknown time  . nicotine (RA NICOTINE) 21 mg/24hr patch Place 1 patch (21 mg total) onto the skin daily. For nicotine addiction 28 patch 0 Past Month at Unknown time  . potassium chloride (K-DUR,KLOR-CON) 10 MEQ tablet Take 1 tablet (10 mEq total) by mouth daily. For low potassium 30 tablet 0 Past Month at Unknown time  . raltegravir (ISENTRESS) 400 MG tablet Take 1 tablet (400 mg total) by mouth 2 (two) times daily. For HIV infection 60 tablet 6 08/04/2015 at Unknown time  . risperiDONE (RISPERDAL) 0.5 MG tablet Take 1 tablet (0.5 mg total) by mouth at bedtime. For mood control 30 tablet 0 Past Month at Unknown time  . spironolactone (ALDACTONE) 25 MG tablet Take 0.5 tablets (12.5 mg total) by mouth daily. For heart condition 30 tablet 0 Past Month at Unknown time  . traZODone (DESYREL) 100 MG tablet Take 1 tablet (  100 mg total) by mouth at bedtime. For sleep 30 tablet 0 Past Month at Unknown time  . valACYclovir (VALTREX) 500 MG tablet Take 1 tablet (500 mg total) by mouth 2 (two) times daily. For Herpes.   Past Month at Unknown time    Previous Psychotropic Medications: Yes   Substance Abuse History in the last 12 months:  Yes.    Consequences of Substance Abuse: Medical Consequences:  crisis management  Results for orders placed or performed during the hospital encounter of 08/04/15 (from the past 72 hour(s))  Urine rapid drug screen (hosp performed) (Not at [facility])     Status: Abnormal   Collection Time: 08/04/15  8:58 PM  Result Value Ref Range   Opiates NONE DETECTED NONE DETECTED   Cocaine POSITIVE (A) NONE DETECTED    Benzodiazepines NONE DETECTED NONE DETECTED   Amphetamines NONE DETECTED NONE DETECTED   Tetrahydrocannabinol NONE DETECTED NONE DETECTED   Barbiturates NONE DETECTED NONE DETECTED    Comment:        DRUG SCREEN FOR MEDICAL PURPOSES ONLY.  IF CONFIRMATION IS NEEDED FOR ANY PURPOSE, NOTIFY LAB WITHIN 5 DAYS.        LOWEST DETECTABLE LIMITS FOR URINE DRUG SCREEN Drug Class       Cutoff (ng/mL) Amphetamine      1000 Barbiturate      200 Benzodiazepine   696 Tricyclics       295 Opiates          300 Cocaine          300 THC              50   Comprehensive metabolic panel     Status: Abnormal   Collection Time: 08/04/15  9:07 PM  Result Value Ref Range   Sodium 141 135 - 145 mmol/L   Potassium 3.3 (L) 3.5 - 5.1 mmol/L   Chloride 110 101 - 111 mmol/L   CO2 24 22 - 32 mmol/L   Glucose, Bld 93 65 - 99 mg/dL   BUN 13 6 - 20 mg/dL   Creatinine, Ser 0.98 0.61 - 1.24 mg/dL   Calcium 8.6 (L) 8.9 - 10.3 mg/dL   Total Protein 6.3 (L) 6.5 - 8.1 g/dL   Albumin 3.9 3.5 - 5.0 g/dL   AST 59 (H) 15 - 41 U/L   ALT 59 17 - 63 U/L   Alkaline Phosphatase 52 38 - 126 U/L   Total Bilirubin 0.9 0.3 - 1.2 mg/dL   GFR calc non Af Amer >60 >60 mL/min   GFR calc Af Amer >60 >60 mL/min    Comment: (NOTE) The eGFR has been calculated using the CKD EPI equation. This calculation has not been validated in all clinical situations. eGFR's persistently <60 mL/min signify possible Chronic Kidney Disease.    Anion gap 7 5 - 15  Ethanol (ETOH)     Status: None   Collection Time: 08/04/15  9:07 PM  Result Value Ref Range   Alcohol, Ethyl (B) <5 <5 mg/dL    Comment:        LOWEST DETECTABLE LIMIT FOR SERUM ALCOHOL IS 5 mg/dL FOR MEDICAL PURPOSES ONLY   Salicylate level     Status: None   Collection Time: 08/04/15  9:07 PM  Result Value Ref Range   Salicylate Lvl <2.8 2.8 - 30.0 mg/dL  Acetaminophen level     Status: Abnormal   Collection Time: 08/04/15  9:07 PM  Result Value Ref Range  Acetaminophen (Tylenol), Serum <10 (L) 10 - 30 ug/mL    Comment:        THERAPEUTIC CONCENTRATIONS VARY SIGNIFICANTLY. A RANGE OF 10-30 ug/mL MAY BE AN EFFECTIVE CONCENTRATION FOR MANY PATIENTS. HOWEVER, SOME ARE BEST TREATED AT CONCENTRATIONS OUTSIDE THIS RANGE. ACETAMINOPHEN CONCENTRATIONS >150 ug/mL AT 4 HOURS AFTER INGESTION AND >50 ug/mL AT 12 HOURS AFTER INGESTION ARE OFTEN ASSOCIATED WITH TOXIC REACTIONS.   CBC     Status: Abnormal   Collection Time: 08/04/15  9:07 PM  Result Value Ref Range   WBC 2.6 (L) 4.0 - 10.5 K/uL   RBC 4.82 4.22 - 5.81 MIL/uL   Hemoglobin 16.5 13.0 - 17.0 g/dL   HCT 45.6 39.0 - 52.0 %   MCV 94.6 78.0 - 100.0 fL   MCH 34.2 (H) 26.0 - 34.0 pg   MCHC 36.2 (H) 30.0 - 36.0 g/dL   RDW 13.4 11.5 - 15.5 %   Platelets 149 (L) 150 - 400 K/uL  RPR     Status: None   Collection Time: 08/04/15  9:39 PM  Result Value Ref Range   RPR Ser Ql Non Reactive Non Reactive    Comment: (NOTE) Performed At: [facility] [place], [place] [id number] Lindon Romp MD KG:[id number]     Observation Level/Precautions:  15 minute checks  Laboratory:  Per ED  Psychotherapy:  Group  Medications:  As per medlist  Consultations:  As needed  Discharge Concerns:  Safety  Estimated LOS:  3-7 days  Other:     Psychological Evaluations: Yes   Treatment Plan Summary: Admit for crisis management and mood stabilization. Medication management to re-stabilize current mood symptoms Group counseling sessions for coping skills Medical consults as needed Review and reinstate any pertinent home medications for other health problems  Medical Decision Making:  Review of Psycho-Social Stressors (1), Review or order clinical lab tests (1), Discuss test with performing physician (1), Decision to obtain old records (1), Review and summation of old records (2) and Review of Medication Regimen & Side Effects (2)  I certify that inpatient services furnished  can reasonably be expected to improve the patient's condition.   [place] AGNP-BC 8/20/20164:35 PM
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
## 2015-08-08 DIAGNOSIS — F333 Major depressive disorder, recurrent, severe with psychotic symptoms: Secondary | ICD-10-CM

## 2015-08-08 LAB — LIPID PANEL
CHOLESTEROL: 174 mg/dL (ref 0–200)
HDL: 45 mg/dL (ref >40)
LDL Cholesterol: 92 mg/dL (ref 0–99)
Total CHOL/HDL Ratio: 3.9 RATIO
Triglycerides: 183 mg/dL — ABNORMAL HIGH (ref <150)
VLDL: 37 mg/dL (ref 0–40)

## 2015-08-08 LAB — TSH: TSH: 0.341 u[IU]/mL — AB (ref 0.350–4.500)

## 2015-08-08 NOTE — Progress Notes (Signed)
Psychoeducational Group Note  Date:  08/08/2015 Time:  0930  Group Topic/Focus:  Recovery Goals:   The focus of this group is to identify appropriate goals for recovery and establish a plan to achieve them.  Participation Level: Did Not Attend  Participation Quality:  Not Applicable  Affect:  Not Applicable  Cognitive:  Not Applicable  Insight:  Not Applicable  Engagement in Group: Not Applicable  Additional Comments:    Rich Brave 08/08/2015, 7:38 PM

## 2015-08-08 NOTE — Progress Notes (Signed)
Ryan Long is OOB UAL on the 300 hall today.Marland KitchenMarland KitchenHe tolerates this well. He is currently homeless and has " nowhere" to be discharged to  and he is quite anxious about this ...spending much of his time  Today focusing on the negatives in his situation.  He attends his groups, he takes his mediations  As ordered  And he is trying to learn to understand how his feelings impact his behavior.    A He completes his daily assessment and  On it he rated his depression, hopelessness and anxiety " 9/9/8", respectively.  And he states he has had suicidal ideation  Today but he contracts with this nurse to not hurt himself f today and  To let this nurse know if he feels like he can no longer continue to stay safe.   R Safety is in place and poc cont.

## 2015-08-08 NOTE — Progress Notes (Signed)
D: Pt presents flat in affect and depressed in mood. "I'm feeling the same". Pt continues to endorse SI with a plan to walk in front of traffic. Pt is able to verbally contract for safety. Pt was in bed resting with his eyes closed during the evening group.  A: Writer administered scheduled and prn medications to pt, per MD orders. Continued support and availability as needed was extended to this pt. Staff continue to monitor pt with q85min checks.  R: No adverse drug reactions noted. Pt receptive to treatment. Pt remains safe at this time.

## 2015-08-08 NOTE — Progress Notes (Signed)
[facility] MD Progress Note  08/08/2015 12:43 PM Ryan Long  MRN:  [id number] Subjective:  Patient was seen in milieu.  He is quiet and non disruptive in the unit.  He does say that he feels about the same.  He reports seeing slanted black lines that times make him "duck as if he will be hit by them."  This is intermittent.  He is not endorsing SI/HI.  Principal Problem: MDD (major depressive disorder), recurrent, severe, with psychosis Diagnosis:   Patient Active Problem List   Diagnosis Date Noted  . MDD (major depressive disorder), recurrent, severe, with psychosis [F33.3] 08/07/2015  . PTSD (post-traumatic stress disorder) [F43.10] 08/07/2015  . Cocaine use disorder, severe, dependence [F14.20] 08/07/2015  . Alcohol use disorder, severe, dependence [F10.20] 08/07/2015  . Tobacco use disorder [Z72.0] 08/07/2015  . Suicidal ideation [R45.851]   . Polysubstance (excluding opioids) dependence with physiological dependence [F19.20] 12/30/2013  . Influenza with other respiratory manifestations [J11.89] 12/22/2013  . Substance induced mood disorder [F19.94] 11/22/2013  . Cocaine abuse, episodic use [F14.10] 03/20/2012    Class: Acute  . Alcohol dependence [F10.20] 03/20/2012    Class: Acute  . Chronic systolic heart failure [I50.22]   . Cardiomyopathy, nonischemic [I42.9] 08/24/2011  . HIV DISEASE [B20] 02/01/2009  . HSV [B00.9] 02/01/2009  . HYPERLIPIDEMIA [E78.5] 02/01/2009  . ALLERGIC RHINITIS [J30.9] 02/01/2009   Total Time spent with patient: 30 minutes   Past Medical History:  Past Medical History  Diagnosis Date  . HIV (human immunodeficiency virus infection)   . Chronic systolic heart failure   . Hypertension   . Genital herpes   . Active smoker   . AR (allergic rhinitis)   . HLD (hyperlipidemia)   . NICM (nonischemic cardiomyopathy)     Echocardiogram 06/28/11: EF 30-35%, mild MR, mild LAE;  No CAD by coronary CT angiogram 3/12 at [facility]  . NSVT  (nonsustained ventricular tachycardia)   . Depression   . Bipolar 1 disorder   . Alcohol abuse   . Crack cocaine use   . COPD (chronic obstructive pulmonary disease)   . CHF (congestive heart failure)   . Anxiety   . PTSD (post-traumatic stress disorder)    History reviewed. No pertinent past surgical history. Family History:  Family History  Problem Relation Age of Onset  . Hypertension Father   . CAD Father    Social History:  History  Alcohol Use  . Yes    Comment: sporadic; approximately every 2 -3 weeks     History  Drug Use  . Yes  . Special: Cocaine    Comment: reports no recent use but hx of THC and cocaine    Social History   Social History  . Marital Status: Single    Spouse Name: N/A  . Number of Children: N/A  . Years of Education: N/A   Social History Main Topics  . Smoking status: Current Every Day Smoker -- 1.00 packs/day for 30 years    Types: Cigarettes  . Smokeless tobacco: Never Used     Comment: 15 cigarettes daily  . Alcohol Use: Yes     Comment: sporadic; approximately every 2 -3 weeks  . Drug Use: Yes    Special: Cocaine     Comment: reports no recent use but hx of THC and cocaine  . Sexual Activity: Yes    Birth Control/ Protection: Condom     Comment: pt. given condoms   Other Topics Concern  . None  Social History Narrative   Additional History:    Sleep: Good  Appetite:  Good   Assessment:   Musculoskeletal: Strength & Muscle Tone: within normal limits Gait & Station: normal Patient leans: N/A   Psychiatric Specialty Exam: Physical Exam  Vitals reviewed. Psychiatric: His mood appears not anxious.    Review of Systems  Cardiovascular: Negative for chest pain.  All other systems reviewed and are negative.   Blood pressure 107/65, pulse 90, temperature 97.9 F (36.6 C), resp. rate 18.There is no weight on file to calculate BMI.   General Appearance: CasualNeat  Eye Contact:: Fair  Speech: Clear and  Coherent  Volume: Normal  Mood: Anxious and Depressed  Affect: Depressedwith slight improvement  Thought Process: Coherent  Orientation: Full (Time, Place, and Person)  Thought Content: Hallucinations: Visual  Suicidal Thoughts: Yes. with intent/plan  Homicidal Thoughts: No  Memory: Immediate; Fair Recent; Fair Remote; Fair  Judgement: Impaired  Insight: Fair  Psychomotor Activity: Normal  Concentration: Fair  Recall: [employer] of Knowledge:Fair  Language: Fair  Akathisia: No  Handed: Right  AIMS (if indicated):    Assets: Communication Skills Desire for Improvement  Sleep:    Cognition: WNL  ADL's: Intact       Current Medications: Current Facility-Administered Medications  Medication Dose Route Frequency Provider Last Rate Last Dose  . albuterol (PROVENTIL HFA;VENTOLIN HFA) 108 (90 BASE) MCG/ACT inhaler 2 puff  2 puff Inhalation Q4H PRN Shuvon B Rankin, NP      . alum & mag hydroxide-simeth (MAALOX/MYLANTA) 200-200-20 MG/5ML suspension 30 mL  30 mL Oral Q4H PRN Shuvon B Rankin, NP      . aspirin chewable tablet 81 mg  81 mg Oral q morning - 10a Shuvon B Rankin, NP   81 mg at 08/08/15 1118  . buPROPion (WELLBUTRIN XL) 24 hr tablet 150 mg  150 mg Oral Daily Shuvon B Rankin, NP   150 mg at 08/08/15 1117  . digoxin (LANOXIN) tablet 125 mcg  125 mcg Oral Daily Shuvon B Rankin, NP   125 mcg at 08/08/15 1123  . doxycycline (VIBRA-TABS) tablet 100 mg  100 mg Oral Q12H Shuvon B Rankin, NP   100 mg at 08/08/15 1117  . emtricitabine-tenofovir (TRUVADA) 200-300 MG per tablet 1 tablet  1 tablet Oral Daily Shuvon B Rankin, NP   1 tablet at 08/08/15 1129  . furosemide (LASIX) tablet 40 mg  40 mg Oral Daily Shuvon B Rankin, NP   40 mg at 08/08/15 1117  . hydrOXYzine (ATARAX/VISTARIL) tablet 25 mg  25 mg Oral TID PRN Shuvon B Rankin, NP   25 mg at 08/08/15 1123  . isosorbide dinitrate (ISORDIL) tablet 5 mg  5 mg Oral TID Shuvon B Rankin,  NP   5 mg at 08/08/15 1118  . lisinopril (PRINIVIL,ZESTRIL) tablet 2.5 mg  2.5 mg Oral Daily Shuvon B Rankin, NP   2.5 mg at 08/08/15 1117  . magnesium hydroxide (MILK OF MAGNESIA) suspension 30 mL  30 mL Oral Daily PRN Shuvon B Rankin, NP      . metoprolol succinate (TOPROL-XL) 24 hr tablet 25 mg  25 mg Oral Daily Shuvon B Rankin, NP   25 mg at 08/08/15 1116  . nicotine (NICODERM CQ - dosed in mg/24 hours) patch 21 mg  21 mg Transdermal Daily Rachael Fee, MD   21 mg at 08/08/15 1130  . potassium chloride (K-DUR,KLOR-CON) CR tablet 10 mEq  10 mEq Oral Daily Shuvon B Rankin, NP  10 mEq at 08/08/15 1116  . raltegravir (ISENTRESS) tablet 400 mg  400 mg Oral BID Shuvon B Rankin, NP   400 mg at 08/08/15 1118  . risperiDONE (RISPERDAL) tablet 0.5 mg  0.5 mg Oral QHS Shuvon B Rankin, NP   0.5 mg at 08/07/15 2143  . spironolactone (ALDACTONE) tablet 12.5 mg  12.5 mg Oral Daily Shuvon B Rankin, NP   12.5 mg at 08/08/15 1118  . traZODone (DESYREL) tablet 100 mg  100 mg Oral QHS Shuvon B Rankin, NP   100 mg at 08/07/15 2143  . valACYclovir (VALTREX) tablet 500 mg  500 mg Oral BID Shuvon B Rankin, NP   500 mg at 08/08/15 1118    Lab Results:  Results for orders placed or performed during the hospital encounter of 08/06/15 (from the past 48 hour(s))  TSH     Status: Abnormal   Collection Time: 08/08/15  6:41 AM  Result Value Ref Range   TSH 0.341 (L) 0.350 - 4.500 uIU/mL    Comment: Performed at [facility]  Lipid panel     Status: Abnormal   Collection Time: 08/08/15  6:41 AM  Result Value Ref Range   Cholesterol 174 0 - 200 mg/dL   Triglycerides 409 (H) <150 mg/dL   HDL 45 >81 mg/dL   Total CHOL/HDL Ratio 3.9 RATIO   VLDL 37 0 - 40 mg/dL   LDL Cholesterol 92 0 - 99 mg/dL    Comment:        Total Cholesterol/HDL:CHD Risk Coronary Heart Disease Risk Table                     Men   Women  1/2 Average Risk   3.4   3.3  Average Risk       5.0   4.4  2 X Average Risk   9.6    7.1  3 X Average Risk  23.4   11.0        Use the calculated Patient Ratio above and the CHD Risk Table to determine the patient's CHD Risk.        ATP III CLASSIFICATION (LDL):  <100     mg/dL   Optimal  191-478  mg/dL   Near or Above                    Optimal  130-159  mg/dL   Borderline  295-621  mg/dL   High  >308     mg/dL   Very High Performed at [facility]     Physical Findings: AIMS: Facial and Oral Movements Muscles of Facial Expression: None, normal Lips and Perioral Area: None, normal Jaw: None, normal Tongue: None, normal,Extremity Movements Upper (arms, wrists, hands, fingers): None, normal Lower (legs, knees, ankles, toes): None, normal, Trunk Movements Neck, shoulders, hips: None, normal, Overall Severity Severity of abnormal movements (highest score from questions above): None, normal Incapacitation due to abnormal movements: None, normal Patient's awareness of abnormal movements (rate only patient's report): No Awareness, Dental Status Current problems with teeth and/or dentures?: No Does patient usually wear dentures?: No  CIWA:    COWS:     Treatment Plan Summary: PLAN OF CARE:  Patient will benefit from inpatient treatment and stabilization.  Estimated length of stay is 5-7 days.  Reviewed past medical records,treatment plan.  Will continue Wellbutrin XL 150 mg -restarted this admission for affective sx. Will continue Trazodone 100 mg po qhs for sleep.  DC Risperidone 0.5 mg po qhs for VH.  Patient will refuse.  He is "worried about his heart." Will restart all home medications where indicated. Will continue to monitor vitals ,medication compliance and treatment side effects while patient is here.  Will monitor for medical issues as well as call consult as needed.  Reviewed labs CBC- wbc uptrending compared to 2 months ago, will monitor, Pt had CD4 Tcell ab, % - which were wnl . UDS - cocaine . Bal <5.  TSH (results 8/21 0.341),   Triglycerides 183 PL, metabolic panel and an EKG done for QTC since pt is on risperidone . Ast/alt elevated likely due to alcohol abuse - BMP shows K+ as low - replace with Kdur 10 mg po daily. Could repeat K level . CSW will start working on disposition.  Patient to participate in therapeutic milieu .    Medical Decision Making:   Review of Psycho-Social Stressors (1), Review or order clinical lab tests (1), Review and summation of old records (2), Established Problem, Worsening (2), Review of Last Therapy Session (1), Review of Medication Regimen & Side Effects (2) and Review of New Medication or Change in Dosage (2)  Ryan Long AGNP-BC 08/08/2015, 12:43 PM

## 2015-08-08 NOTE — Progress Notes (Signed)
Pt is alert and oriented x 4. Pt c/o of severe depression of 10 on a 0-10 depression scale, he states, "I was told by the social worker that she had found an apartment for me; then she later said the apartment has been given away; I feel that one of the other Pt went to the social worker to tell her something bad about me; I am more depressed now than I was yesterday."  Pt also c/o mild anxiety. Pt at this time denies SI, HI, and pain. Pt was very cooperative and pleasant during assessment.   A: Pt attended group. Medications administered as prescribed.  Support, encouragement, and safe environment provided.  15-minute safety checks continue.  R: Pt was med compliant.  Safety checks continue.

## 2015-08-08 NOTE — Progress Notes (Signed)
Patient did not attend the evening speaker AA meeting. Pt was notified that group was beginning but remained in his room.    

## 2015-08-08 NOTE — [facility] Group Notes (Signed)
[facility] Group Notes:  (Clinical Social Work)  08/08/2015  10:00-11:00AM  Summary of Progress/Problems:   The main focus of today's process group was to   1)  discuss the importance of adding supports  2)  define health supports versus unhealthy supports  3)  identify the patient's current unhealthy supports and plan how to handle them  4)  Identify the patient's current healthy supports and plan what to add.  An emphasis was placed on using counselor, doctor, therapy groups, 12-step groups, and problem-specific support groups to expand supports.    The patient was late to group, left two times during group but returned.  He did not share at all during group.  Type of Therapy:  Process Group with Motivational Interviewing  Participation Level:  Minimal  Participation Quality:  Attentive  Affect:  Blunted  Cognitive:  Alert  Insight:  Limited  Engagement in Therapy:  Limited  Modes of Intervention:   Education, Support and Processing, Activity  Ambrose Mantle, LCSW 08/08/2015

## 2015-08-08 NOTE — Progress Notes (Signed)
Psychoeducational Group Note  Date:  08/08/2015 Time: 1315 Group Topic/Focus:  Making Healthy Choices:   The focus of this group is to help patients identify negative/unhealthy choices they were using prior to admission and identify positive/healthier coping strategies to replace them upon discharge.  Participation Level:  Did Not Attend   Additional Comments:    Rich Brave 7:02 PM. 08/08/2015

## 2015-08-09 LAB — HEMOGLOBIN A1C
HEMOGLOBIN A1C: 5.3 % (ref 4.8–5.6)
MEAN PLASMA GLUCOSE: 105 mg/dL

## 2015-08-09 LAB — T4, FREE: Free T4: 0.63 ng/dL (ref 0.61–1.12)

## 2015-08-09 LAB — PROLACTIN: PROLACTIN: 32.7 ng/mL — AB (ref 4.0–15.2)

## 2015-08-09 MED ORDER — TRAZODONE HCL 150 MG PO TABS
150.0000 mg | ORAL_TABLET | Freq: Every day | ORAL | Status: DC
  Administered 2015-08-09 – 2015-08-11 (×3): 150 mg via ORAL
  Filled 2015-08-09 (×3): qty 1
  Filled 2015-08-09: qty 14
  Filled 2015-08-09 (×2): qty 1

## 2015-08-09 NOTE — Tx Team (Signed)
Interdisciplinary Treatment Plan Update (Adult)  Date:  08/09/2015  Time Reviewed:  12:16 PM   Progress in Treatment: Attending groups: No. Participating in groups:  No. Taking medication as prescribed:  Yes. Tolerating medication:  Yes. Family/Significant othe contact made:  Not yet. SPE required for this pt.  Patient understands diagnosis:  Yes. and As evidenced by:  seeking treatment for ETOH detox, crack cocaine abuse, depression, and for medication stabilization. Discussing patient identified problems/goals with staff:  Yes. Medical problems stabilized or resolved:  Yes. Denies suicidal/homicidal ideation: Yes. Issues/concerns per patient self-inventory:  Other:  Discharge Plan or Barriers: CSW assessing. Pt goes to [place] (in the past). Given [facility] list.   Reason for Continuation of Hospitalization: Depression Medication stabilization Withdrawal symptoms  Comments:  Ryan Long is an 54 y.o. male. Patient was recently made homeless. He is depressed and has been thinking of killing himself by stepping in front of a truck for the last day.Patient says that he has been homeless for the last two months and staying at a shelter. Patient had been waiting for housing authority to help him get a home. He finally got a place but after about 5 weeks had to move out because of bed bugs. He said that they had infested his new furniture that he had purchased. Was told by housing authority that it would be a year before they could find a place for him. He could not tolerate the bed bugs so he moved out and has been homeless for the last two months.Patient does report suicidal thoughts with plan to kill himself by stepping in front of a truck. He has no HI or A/V hallucinations. He does report using ETOH and cocaine about two days ago. Reports no withdrawal symptoms.  Patient reports that he has no psychiatrist. He has been off medications for the last two weeks or so. He  reportedly was getting medications filled by [facility]. Patient has been at [facility] multiple times in the last couple years. Last stay at [facility] was in September of '15.Axis I: Major Depression, single episode and Substance Abuse  Estimated length of stay:  3-5 days   New goal(s): to formulate effective aftercare plan.   Additional Comments:  Patient and CSW reviewed pt's identified goals and treatment plan. Patient verbalized understanding and agreed to treatment plan. CSW reviewed [facility] "Discharge Process and Patient Involvement" Form. Pt verbalized understanding of information provided and signed form.    Review of initial/current patient goals per problem list:  1. Goal(s): Patient will participate in aftercare plan  Met:  No.   Target date: at discharge  As evidenced by: Patient will participate within aftercare plan AEB aftercare provider and housing plan at discharge being identified.  8/22: CSW assessing. [place] list given. [facility] for follow-up.   2. Goal (s): Patient will exhibit decreased depressive symptoms and suicidal ideations.  Met: No.    Target date: at discharge  As evidenced by: Patient will utilize self rating of depression at 3 or below and demonstrate decreased signs of depression or be deemed stable for discharge by MD.  8/22: Pt rates depression as high. Denies SI/HI/AVH at this time.    3. Goal(s): Patient will demonstrate decreased signs of withdrawal due to substance abuse  Met:Yes   Target date:at discharge   As evidenced by: Patient will produce a CIWA/COWS score of 0, have stable vitals signs, and no symptoms of withdrawal.  8/22: Pt denies withdrawal symptoms. Stable vitals. No CIWA/COWS  score. No detox protocol .Goal met.    Attendees: Patient:   08/09/2015 12:16 PM   Family:   08/09/2015 12:16 PM   Physician:  Dr. Carlton Adam, MD 08/09/2015 12:16 PM   Nursing:   Santiago Glad RN; [place] RN 08/09/2015 12:16 PM   Clinical Social  Worker: Maxie Better, [place]  08/09/2015 12:16 PM   Clinical Social Worker: Erasmo Downer Drinkard LCSWA; Peri Maris LCSWA 08/09/2015 12:16 PM   Other:  Gerline Legacy Nurse Case Manager 08/09/2015 12:16 PM   Other:  Lucinda Dell; [facility]  08/09/2015 12:16 PM   Other:   08/09/2015 12:16 PM   Other:  08/09/2015 12:16 PM   Other:  08/09/2015 12:16 PM   Other:  08/09/2015 12:16 PM    08/09/2015 12:16 PM    08/09/2015 12:16 PM    08/09/2015 12:16 PM    08/09/2015 12:16 PM    Scribe for Treatment Team:   Maxie Better, [place]  08/09/2015 12:16 PM

## 2015-08-09 NOTE — Progress Notes (Signed)
D: Pt continues to endorse SI. Pt does verbalize that his suicidal ideation is not as frequent. Pt reports that he was able to stay active until after dinner. " I don't want to meet new people".  Despite encouragement, pt did not attend group. Pt denied any HI/AVH. Pt reports that he may get up later in the night to go to the dayroom.  A: Writer administered scheduled and prn medications to pt. Continued support and availability as needed was extended to this pt. Staff continue to monitor pt with q70min checks.  R: No adverse drug reactions noted. Pt receptive to treatment. Pt remains safe at this time.

## 2015-08-09 NOTE — Progress Notes (Signed)
[facility] MD Progress Note  08/09/2015 7:29 PM Ryan Long  MRN:  [id number] Subjective:  Ryan Long states that he has been having a hard time dealing with several stressors including being homeless. He is drinking and using cocaine. He is concerned about using cocaine as he is concerned about his heart condition. He has not been compliant with his medications,. States things got so bad that he was feeling hopeless helpless so he started thinking about hurting himself. He still does not know what to do does not see many options Principal Problem: MDD (major depressive disorder), recurrent, severe, with psychosis Diagnosis:   Patient Active Problem List   Diagnosis Date Noted  . MDD (major depressive disorder), recurrent, severe, with psychosis [F33.3] 08/07/2015  . PTSD (post-traumatic stress disorder) [F43.10] 08/07/2015  . Cocaine use disorder, severe, dependence [F14.20] 08/07/2015  . Alcohol use disorder, severe, dependence [F10.20] 08/07/2015  . Tobacco use disorder [Z72.0] 08/07/2015  . Suicidal ideation [R45.851]   . Polysubstance (excluding opioids) dependence with physiological dependence [F19.20] 12/30/2013  . Influenza with other respiratory manifestations [J11.89] 12/22/2013  . Substance induced mood disorder [F19.94] 11/22/2013  . Cocaine abuse, episodic use [F14.10] 03/20/2012    Class: Acute  . Alcohol dependence [F10.20] 03/20/2012    Class: Acute  . Chronic systolic heart failure [I50.22]   . Cardiomyopathy, nonischemic [I42.9] 08/24/2011  . HIV DISEASE [B20] 02/01/2009  . HSV [B00.9] 02/01/2009  . HYPERLIPIDEMIA [E78.5] 02/01/2009  . ALLERGIC RHINITIS [J30.9] 02/01/2009   Total Time spent with patient: 30 minutes   Past Medical History:  Past Medical History  Diagnosis Date  . HIV (human immunodeficiency virus infection)   . Chronic systolic heart failure   . Hypertension   . Genital herpes   . Active smoker   . AR (allergic rhinitis)   . HLD (hyperlipidemia)   .  NICM (nonischemic cardiomyopathy)     Echocardiogram 06/28/11: EF 30-35%, mild MR, mild LAE;  No CAD by coronary CT angiogram 3/12 at [facility]  . NSVT (nonsustained ventricular tachycardia)   . Depression   . Bipolar 1 disorder   . Alcohol abuse   . Crack cocaine use   . COPD (chronic obstructive pulmonary disease)   . CHF (congestive heart failure)   . Anxiety   . PTSD (post-traumatic stress disorder)    History reviewed. No pertinent past surgical history. Family History:  Family History  Problem Relation Age of Onset  . Hypertension Father   . CAD Father    Social History:  History  Alcohol Use  . Yes    Comment: sporadic; approximately every 2 -3 weeks     History  Drug Use  . Yes  . Special: Cocaine    Comment: reports no recent use but hx of THC and cocaine    Social History   Social History  . Marital Status: Single    Spouse Name: N/A  . Number of Children: N/A  . Years of Education: N/A   Social History Main Topics  . Smoking status: Current Every Day Smoker -- 1.00 packs/day for 30 years    Types: Cigarettes  . Smokeless tobacco: Never Used     Comment: 15 cigarettes daily  . Alcohol Use: Yes     Comment: sporadic; approximately every 2 -3 weeks  . Drug Use: Yes    Special: Cocaine     Comment: reports no recent use but hx of THC and cocaine  . Sexual Activity: Yes    Birth  Control/ Protection: Condom     Comment: pt. given condoms   Other Topics Concern  . None   Social History Narrative   Additional History:    Sleep: Fair  Appetite:  Fair   Assessment:   Musculoskeletal: Strength & Muscle Tone: within normal limits Gait & Station: normal Patient leans: normal   Psychiatric Specialty Exam: Physical Exam  Review of Systems  Constitutional: Negative.   HENT: Negative.   Eyes: Negative.   Respiratory: Negative.   Cardiovascular: Negative.   Gastrointestinal: Negative.   Genitourinary: Negative.    Musculoskeletal: Negative.   Skin: Negative.   Neurological: Negative.   Endo/Heme/Allergies: Negative.   Psychiatric/Behavioral: Positive for depression and substance abuse.    Blood pressure 113/65, pulse 91, temperature 98.5 F (36.9 C), temperature source Oral, resp. rate 18.There is no weight on file to calculate BMI.  General Appearance: Fairly Groomed  Patent attorney::  Fair  Speech:  Clear and Coherent and Slow  Volume:  Decreased  Mood:  Anxious and Depressed  Affect:  Restricted  Thought Process:  Coherent and Goal Directed  Orientation:  Full (Time, Place, and Person)  Thought Content:  symptoms envents worries concerns  Suicidal Thoughts:  No  Homicidal Thoughts:  No  Memory:  Immediate;   Fair Recent;   Fair Remote;   Fair  Judgement:  Fair  Insight:  Present and Shallow  Psychomotor Activity:  Restlessness  Concentration:  Fair  Recall:  [employer] of Knowledge:Fair  Language: Fair  Akathisia:  No  Handed:  Right  AIMS (if indicated):     Assets:  Desire for Improvement  ADL's:  Intact  Cognition: WNL  Sleep:  Number of Hours: 5.5     Current Medications: Current Facility-Administered Medications  Medication Dose Route Frequency Provider Last Rate Last Dose  . albuterol (PROVENTIL HFA;VENTOLIN HFA) 108 (90 BASE) MCG/ACT inhaler 2 puff  2 puff Inhalation Q4H PRN Shuvon B Rankin, NP      . alum & mag hydroxide-simeth (MAALOX/MYLANTA) 200-200-20 MG/5ML suspension 30 mL  30 mL Oral Q4H PRN Shuvon B Rankin, NP      . aspirin chewable tablet 81 mg  81 mg Oral q morning - 10a Shuvon B Rankin, NP   81 mg at 08/09/15 1003  . buPROPion (WELLBUTRIN XL) 24 hr tablet 150 mg  150 mg Oral Daily Shuvon B Rankin, NP   150 mg at 08/09/15 0749  . digoxin (LANOXIN) tablet 125 mcg  125 mcg Oral Daily Shuvon B Rankin, NP   125 mcg at 08/09/15 0748  . doxycycline (VIBRA-TABS) tablet 100 mg  100 mg Oral Q12H Shuvon B Rankin, NP   100 mg at 08/09/15 0749  . emtricitabine-tenofovir  (TRUVADA) 200-300 MG per tablet 1 tablet  1 tablet Oral Daily Shuvon B Rankin, NP   1 tablet at 08/09/15 0748  . furosemide (LASIX) tablet 40 mg  40 mg Oral Daily Shuvon B Rankin, NP   40 mg at 08/09/15 0749  . hydrOXYzine (ATARAX/VISTARIL) tablet 25 mg  25 mg Oral TID PRN Shuvon B Rankin, NP   25 mg at 08/09/15 0753  . isosorbide dinitrate (ISORDIL) tablet 5 mg  5 mg Oral TID Shuvon B Rankin, NP   5 mg at 08/09/15 1724  . lisinopril (PRINIVIL,ZESTRIL) tablet 2.5 mg  2.5 mg Oral Daily Shuvon B Rankin, NP   2.5 mg at 08/09/15 0749  . magnesium hydroxide (MILK OF MAGNESIA) suspension 30 mL  30 mL Oral Daily  PRN Shuvon B Rankin, NP      . metoprolol succinate (TOPROL-XL) 24 hr tablet 25 mg  25 mg Oral Daily Shuvon B Rankin, NP   25 mg at 08/09/15 0749  . nicotine (NICODERM CQ - dosed in mg/24 hours) patch 21 mg  21 mg Transdermal Daily Rachael Fee, MD   21 mg at 08/09/15 0750  . potassium chloride (K-DUR,KLOR-CON) CR tablet 10 mEq  10 mEq Oral Daily Shuvon B Rankin, NP   10 mEq at 08/09/15 0749  . raltegravir (ISENTRESS) tablet 400 mg  400 mg Oral BID Shuvon B Rankin, NP   400 mg at 08/09/15 1724  . spironolactone (ALDACTONE) tablet 12.5 mg  12.5 mg Oral Daily Shuvon B Rankin, NP   12.5 mg at 08/09/15 0749  . traZODone (DESYREL) tablet 150 mg  150 mg Oral QHS Rachael Fee, MD      . valACYclovir (VALTREX) tablet 500 mg  500 mg Oral BID Shuvon B Rankin, NP   500 mg at 08/09/15 1724    Lab Results:  Results for orders placed or performed during the hospital encounter of 08/06/15 (from the past 48 hour(s))  TSH     Status: Abnormal   Collection Time: 08/08/15  6:41 AM  Result Value Ref Range   TSH 0.341 (L) 0.350 - 4.500 uIU/mL    Comment: Performed at [facility]  Lipid panel     Status: Abnormal   Collection Time: 08/08/15  6:41 AM  Result Value Ref Range   Cholesterol 174 0 - 200 mg/dL   Triglycerides 409 (H) <150 mg/dL   HDL 45 >81 mg/dL   Total CHOL/HDL Ratio 3.9  RATIO   VLDL 37 0 - 40 mg/dL   LDL Cholesterol 92 0 - 99 mg/dL    Comment:        Total Cholesterol/HDL:CHD Risk Coronary Heart Disease Risk Table                     Men   Women  1/2 Average Risk   3.4   3.3  Average Risk       5.0   4.4  2 X Average Risk   9.6   7.1  3 X Average Risk  23.4   11.0        Use the calculated Patient Ratio above and the CHD Risk Table to determine the patient's CHD Risk.        ATP III CLASSIFICATION (LDL):  <100     mg/dL   Optimal  191-478  mg/dL   Near or Above                    Optimal  130-159  mg/dL   Borderline  295-621  mg/dL   High  >308     mg/dL   Very High Performed at [facility]   Prolactin     Status: Abnormal   Collection Time: 08/08/15  6:41 AM  Result Value Ref Range   Prolactin 32.7 (H) 4.0 - 15.2 ng/mL    Comment: (NOTE) Performed At: [facility] [address] [place], [place] [id number] Mila Homer MD [id number] Performed at [facility]   Hemoglobin A1c     Status: None   Collection Time: 08/08/15  6:41 AM  Result Value Ref Range   Hgb A1c MFr Bld 5.3 4.8 - 5.6 %    Comment: (NOTE)  Pre-diabetes: 5.7 - 6.4         Diabetes: >6.4         Glycemic control for adults with diabetes: <7.0    Mean Plasma Glucose 105 mg/dL    Comment: (NOTE) Performed At: [facility] [address] [place], [place] [id number] Mila Homer MD [id number] Performed at [facility]   T4, free     Status: None   Collection Time: 08/09/15  6:15 AM  Result Value Ref Range   Free T4 0.63 0.61 - 1.12 ng/dL    Comment: Performed at [facility]    Physical Findings: AIMS: Facial and Oral Movements Muscles of Facial Expression: None, normal Lips and Perioral Area: None, normal Jaw: None, normal Tongue: None, normal,Extremity Movements Upper (arms, wrists, hands, fingers): None, normal Lower (legs, knees, ankles, toes): None, normal, Trunk  Movements Neck, shoulders, hips: None, normal, Overall Severity Severity of abnormal movements (highest score from questions above): None, normal Incapacitation due to abnormal movements: None, normal Patient's awareness of abnormal movements (rate only patient's report): No Awareness, Dental Status Current problems with teeth and/or dentures?: No Does patient usually wear dentures?: No  CIWA:    COWS:     Treatment Plan Summary: Daily contact with patient to assess and evaluate symptoms and progress in treatment and Medication management Supportive approach/coping skills Alcohol cocaine dependence; identify detox needs/work a relapse prevention plan Depression; resume the Wellbutrin and optimize dose-response Insomnia; continue to work with the Trazodone 150 mg HS Optimize response to the antihypertensives and cardiac meds Explore residential treatment options  Medical Decision Making:  Review of Psycho-Social Stressors (1), Review or order clinical lab tests (1), Review of Medication Regimen & Side Effects (2) and Review of New Medication or Change in Dosage (2)     Ryan Long A 08/09/2015, 7:29 PM

## 2015-08-09 NOTE — Progress Notes (Signed)
D: Ryan Long admitted SI but contracted for safety. He denied HI/AVH/pain. He reported waking up 4-5 times during the night. He rated depression 7, anxiety 6, and hopelessness 7. He said he planned to work on his workbook and start calling [employer] He indicated he hopes to speak with LCSW about scheduling primary care, cardiology, and infectious disease appointments for him. He has been calm and appropriate on the unit.  A: Meds given as ordered, including Vistaril in the a.m for anxiety with relief. Q15 safety checks maintained. Support/encouragement offered. R: Pt remains free from harm and continues with treatment. Will continue to monitor for needs/safety.

## 2015-08-09 NOTE — [facility] Group Notes (Signed)
[facility] LCSW Group Therapy  08/09/2015 1:13 PM  Type of Therapy:  Group Therapy  Participation Level:  Active  Participation Quality:  Attentive  Affect:  Appropriate  Cognitive:  Alert and Oriented  Insight:  Engaged  Engagement in Therapy:  Improving  Modes of Intervention:  Confrontation, Discussion, Education, Exploration, Problem-solving, Rapport Building, Socialization and Support  Summary of Progress/Problems: Today's Topic: Overcoming Obstacles. Patients identified one short term goal and potential obstacles in reaching this goal. Patients processed barriers involved in overcoming these obstacles. Patients identified steps necessary for overcoming these obstacles and explored motivation (internal and external) for facing these difficulties head on. Ryan Long was attentive and engaged during today's processing group. He shared that his biggest obstacle was finding a safe and stable place after leaving the hospital. Ryan Long and CSW brainstormed and came up with a few options. [facility] in [facility], [facility], and [facility]. He was also asking CS[facility] to [facility] and check bed availability for Wed evening "just in case." Ryan Long stated that he would contact Winferd Humphrey about the halfway house this evening and talk to him.   Smart, Nicolle Heward LCSWA 08/09/2015, 1:13 PM

## 2015-08-09 NOTE — Plan of Care (Signed)
Problem: Diagnosis: Increased Risk For Suicide Attempt Goal: STG-Patient Will Report Suicidal Feelings to Staff Outcome: Progressing D: Pt verbalized suicidal ideation with a plan to walk in front of traffic to [facility].

## 2015-08-09 NOTE — [facility] Group Notes (Signed)
[facility] LCSW Aftercare Discharge Planning Group Note   08/09/2015 10:11 AM  Participation Quality:  DID NOT ATTEND. Pt chose to remain in bed.   Smart, [employer]

## 2015-08-09 NOTE — Progress Notes (Signed)
Recreation Therapy Notes  Date: 08.22.2016 Time: 9:30am Location: [address] Dayroom   Group Topic: Stress Management  Goal Area(s) Addresses:  Patient will actively participate in stress management techniques presented during session.   Behavioral Response: Did not attend.   Marykay Lex Breeanna Galgano, LRT/CTRS  Jearl Klinefelter 08/09/2015 1:43 PM

## 2015-08-09 NOTE — Clinical Social Work Note (Signed)
CSW met with pt individually to discuss aftercare. [facility] application given to pt, [facility] and [facility] info also provided.  Per pt request, CSW contacted [facility] ([phone]) to check bed availability for Wed night--voicemail left requesting call back. Pt agreeable to following up at [facility] for med management and is interested in [facility] for [facility] IOP/therapy.    Smart, LCSWA Clinical Social Worker 08/09/2015 3:22 PM   

## 2015-08-10 LAB — T3: T3 TOTAL: 134 ng/dL (ref 71–180)

## 2015-08-10 NOTE — [facility] Group Notes (Signed)
[facility] Group Notes:  (Nursing/MHT/Case Management/Adjunct)  Date:  08/10/2015  Time:  10:38 AM  Type of Therapy:  Nurse Education  Participation Level:  Did Not Attend  Participation Quality:  did not attend  Affect:  dd not attend  Cognitive:  Alert  Insight:  Improving  Engagement in Group:  did not attend group  Modes of Intervention:  Discussion and Education  Summary of Progress/Problems: Group was on recovery today but he did not attend.   Wynona Luna 08/10/2015, 10:38 AM

## 2015-08-10 NOTE — Progress Notes (Signed)
[facility] MD Progress Note  08/10/2015 6:52 PM Ryan Long  MRN:  [id number] Subjective:  Ryan Long continues to work on self. He does not want to go to a residential treatment program. States he would rather go to the [employer] or a half way house and to go meetings and follow up with the mental health agency. Did not sleep as well last night he did not take the Vistaril with the Trazodone as he has done before. Principal Problem: MDD (major depressive disorder), recurrent, severe, with psychosis Diagnosis:   Patient Active Problem List   Diagnosis Date Noted  . MDD (major depressive disorder), recurrent, severe, with psychosis [F33.3] 08/07/2015  . PTSD (post-traumatic stress disorder) [F43.10] 08/07/2015  . Cocaine use disorder, severe, dependence [F14.20] 08/07/2015  . Alcohol use disorder, severe, dependence [F10.20] 08/07/2015  . Tobacco use disorder [Z72.0] 08/07/2015  . Suicidal ideation [R45.851]   . Polysubstance (excluding opioids) dependence with physiological dependence [F19.20] 12/30/2013  . Influenza with other respiratory manifestations [J11.89] 12/22/2013  . Substance induced mood disorder [F19.94] 11/22/2013  . Cocaine abuse, episodic use [F14.10] 03/20/2012    Class: Acute  . Alcohol dependence [F10.20] 03/20/2012    Class: Acute  . Chronic systolic heart failure [I50.22]   . Cardiomyopathy, nonischemic [I42.9] 08/24/2011  . HIV DISEASE [B20] 02/01/2009  . HSV [B00.9] 02/01/2009  . HYPERLIPIDEMIA [E78.5] 02/01/2009  . ALLERGIC RHINITIS [J30.9] 02/01/2009   Total Time spent with patient: 30 minutes   Past Medical History:  Past Medical History  Diagnosis Date  . HIV (human immunodeficiency virus infection)   . Chronic systolic heart failure   . Hypertension   . Genital herpes   . Active smoker   . AR (allergic rhinitis)   . HLD (hyperlipidemia)   . NICM (nonischemic cardiomyopathy)     Echocardiogram 06/28/11: EF 30-35%, mild MR, mild LAE;  No CAD by coronary CT  [facility] 3/12 at [facility]  . NSVT (nonsustained ventricular tachycardia)   . Depression   . Bipolar 1 disorder   . Alcohol abuse   . Crack cocaine use   . COPD (chronic obstructive pulmonary disease)   . CHF (congestive heart failure)   . Anxiety   . PTSD (post-traumatic stress disorder)    History reviewed. No pertinent past surgical history. Family History:  Family History  Problem Relation Age of Onset  . Hypertension Father   . CAD Father    Social History:  History  Alcohol Use  . Yes    Comment: sporadic; approximately every 2 -3 weeks     History  Drug Use  . Yes  . Special: Cocaine    Comment: reports no recent use but hx of THC and cocaine    Social History   Social History  . Marital Status: Single    Spouse Name: N/A  . Number of Children: N/A  . Years of Education: N/A   Social History Main Topics  . Smoking status: Current Every Day Smoker -- 1.00 packs/day for 30 years    Types: Cigarettes  . Smokeless tobacco: Never Used     Comment: 15 cigarettes daily  . Alcohol Use: Yes     Comment: sporadic; approximately every 2 -3 weeks  . Drug Use: Yes    Special: Cocaine     Comment: reports no recent use but hx of THC and cocaine  . Sexual Activity: Yes    Birth Control/ Protection: Condom     Comment: pt. given condoms  Other Topics Concern  . None   Social History Narrative   Additional History:    Sleep: Fair  Appetite:  Fair   Assessment:   Musculoskeletal: Strength & Muscle Tone: within normal limits Gait & Station: normal Patient leans: normal   Psychiatric Specialty Exam: Physical Exam  Review of Systems  Constitutional: Positive for malaise/fatigue.  HENT: Negative.   Eyes: Negative.   Respiratory: Negative.   Cardiovascular: Negative.   Gastrointestinal: Negative.   Genitourinary: Negative.   Musculoskeletal: Negative.   Skin: Negative.   Neurological: Negative.   Endo/Heme/Allergies: Negative.    Psychiatric/Behavioral: Positive for depression and substance abuse. The patient is nervous/anxious.     Blood pressure 93/63, pulse 91, temperature 97.9 F (36.6 C), temperature source Oral, resp. rate 17.There is no weight on file to calculate BMI.  General Appearance: Fairly Groomed  Patent attorney::  Fair  Speech:  Clear and Coherent  Volume:  Normal  Mood:  Anxious and worried  Affect:  Appropriate  Thought Process:  Coherent and Goal Directed  Orientation:  Full (Time, Place, and Person)  Thought Content:  symptoms events worries concerns  Suicidal Thoughts:  No  Homicidal Thoughts:  No  Memory:  Immediate;   Fair Recent;   Fair Remote;   Fair  Judgement:  Fair  Insight:  Shallow  Psychomotor Activity:  Restlessness  Concentration:  Fair  Recall:  [employer] of Knowledge:Fair  Language: Fair  Akathisia:  No  Handed:  Right  AIMS (if indicated):     Assets:  Desire for Improvement  ADL's:  Intact  Cognition: WNL  Sleep:  Number of Hours: 5.5     Current Medications: Current Facility-Administered Medications  Medication Dose Route Frequency Provider Last Rate Last Dose  . albuterol (PROVENTIL HFA;VENTOLIN HFA) 108 (90 BASE) MCG/ACT inhaler 2 puff  2 puff Inhalation Q4H PRN Shuvon B Rankin, NP      . alum & mag hydroxide-simeth (MAALOX/MYLANTA) 200-200-20 MG/5ML suspension 30 mL  30 mL Oral Q4H PRN Shuvon B Rankin, NP      . aspirin chewable tablet 81 mg  81 mg Oral q morning - 10a Shuvon B Rankin, NP   81 mg at 08/10/15 0948  . buPROPion (WELLBUTRIN XL) 24 hr tablet 150 mg  150 mg Oral Daily Shuvon B Rankin, NP   150 mg at 08/10/15 0748  . digoxin (LANOXIN) tablet 125 mcg  125 mcg Oral Daily Shuvon B Rankin, NP   125 mcg at 08/10/15 0748  . doxycycline (VIBRA-TABS) tablet 100 mg  100 mg Oral Q12H Shuvon B Rankin, NP   100 mg at 08/10/15 0746  . emtricitabine-tenofovir (TRUVADA) 200-300 MG per tablet 1 tablet  1 tablet Oral Daily Shuvon B Rankin, NP   1 tablet at  08/10/15 0748  . furosemide (LASIX) tablet 40 mg  40 mg Oral Daily Shuvon B Rankin, NP   40 mg at 08/10/15 0745  . hydrOXYzine (ATARAX/VISTARIL) tablet 25 mg  25 mg Oral TID PRN Shuvon B Rankin, NP   25 mg at 08/09/15 0753  . isosorbide dinitrate (ISORDIL) tablet 5 mg  5 mg Oral TID Shuvon B Rankin, NP   5 mg at 08/10/15 1734  . lisinopril (PRINIVIL,ZESTRIL) tablet 2.5 mg  2.5 mg Oral Daily Shuvon B Rankin, NP   2.5 mg at 08/10/15 0747  . magnesium hydroxide (MILK OF MAGNESIA) suspension 30 mL  30 mL Oral Daily PRN Shuvon B Rankin, NP      .  metoprolol succinate (TOPROL-XL) 24 hr tablet 25 mg  25 mg Oral Daily Shuvon B Rankin, NP   25 mg at 08/10/15 0745  . nicotine (NICODERM CQ - dosed in mg/24 hours) patch 21 mg  21 mg Transdermal Daily Rachael Fee, MD   21 mg at 08/10/15 0749  . potassium chloride (K-DUR,KLOR-CON) CR tablet 10 mEq  10 mEq Oral Daily Shuvon B Rankin, NP   10 mEq at 08/10/15 0748  . raltegravir (ISENTRESS) tablet 400 mg  400 mg Oral BID Shuvon B Rankin, NP   400 mg at 08/10/15 1734  . spironolactone (ALDACTONE) tablet 12.5 mg  12.5 mg Oral Daily Shuvon B Rankin, NP   12.5 mg at 08/10/15 0747  . traZODone (DESYREL) tablet 150 mg  150 mg Oral QHS Rachael Fee, MD   150 mg at 08/09/15 2230  . valACYclovir (VALTREX) tablet 500 mg  500 mg Oral BID Shuvon B Rankin, NP   500 mg at 08/10/15 1734    Lab Results:  Results for orders placed or performed during the hospital encounter of 08/06/15 (from the past 48 hour(s))  T4, free     Status: None   Collection Time: 08/09/15  6:15 AM  Result Value Ref Range   Free T4 0.63 0.61 - 1.12 ng/dL    Comment: Performed at [facility]  T3     Status: None   Collection Time: 08/09/15  6:15 AM  Result Value Ref Range   T3, Total 134 71 - 180 ng/dL    Comment: (NOTE) Performed At: [facility] [address] [place], [place] [id number] Mila Homer MD [id number] Performed at [facility]      Physical Findings: AIMS: Facial and Oral Movements Muscles of Facial Expression: None, normal Lips and Perioral Area: None, normal Jaw: None, normal Tongue: None, normal,Extremity Movements Upper (arms, wrists, hands, fingers): None, normal Lower (legs, knees, ankles, toes): None, normal, Trunk Movements Neck, shoulders, hips: None, normal, Overall Severity Severity of abnormal movements (highest score from questions above): None, normal Incapacitation due to abnormal movements: None, normal Patient's awareness of abnormal movements (rate only patient's report): No Awareness, Dental Status Current problems with teeth and/or dentures?: No Does patient usually wear dentures?: No  CIWA:    COWS:     Treatment Plan Summary: Daily contact with patient to assess and evaluate symptoms and progress in treatment and Medication management Supportive approach/coping skills Alcohol cocaine dependence; continue to work a relapse prevention plan Major depression; continue the Wellbutrin XL 150 mg in AM Insomnia; will have the Trazodone 150 mg with the Vistaril 25 mg HS and reassess Continue to work with CBT/mindfulness Continue to explore placement options  Medical Decision Making:  Review of Psycho-Social Stressors (1) and Review of Medication Regimen & Side Effects (2)     Daziyah Cogan A 08/10/2015, 6:52 PM

## 2015-08-10 NOTE — [facility] Group Notes (Signed)
[facility] LCSW Group Therapy  08/10/2015 11:29 AM  Type of Therapy:  Group Therapy  Participation Level:  Active  Participation Quality:  Attentive  Affect:  Appropriate  Cognitive:  Alert and Oriented  Insight:  Improving  Engagement in Therapy:  Improving  Modes of Intervention:  Confrontation, Discussion, Education, Exploration, Problem-solving, Rapport Building, Socialization and Support  Summary of Progress/Problems: MHA Speaker came to talk about his personal journey with substance abuse and addiction. The pt processed ways by which to relate to the speaker. MHA speaker provided handouts and educational information pertaining to groups and services offered by the [facility]   Smart, Terese Heier LCSWA  08/10/2015, 11:29 AM

## 2015-08-10 NOTE — Progress Notes (Signed)
Patient ID: Ryan Long, male   DOB: Oct 06, 1961, 54 y.o.   MRN: [id number] D-denies any specific problems and has no complaints of withdrawal sx. He is pleasant with a flat affect. He would like to go from here on discharge to a half way house and states his social worker is helping him with that.  A-Support provided. He did not compete his self inventory this am. He showered and shaved rather than attending am group run by [employer]. Medications as ordered and monitor for safety. R-No complaints at this time. Positive peer interactions noted. Cooperative with his many medications and says he handles them himself with the aid of a med box.

## 2015-08-10 NOTE — Progress Notes (Signed)
Patient attended AA group tonight. 

## 2015-08-10 NOTE — Plan of Care (Signed)
Problem: Alteration in mood Goal: LTG-Patient reports reduction in suicidal thoughts (Patient reports reduction in suicidal thoughts and is able to verbalize a safety plan for whenever patient is feeling suicidal)  Outcome: Progressing Pt remains suicidal but with a reduction in the frequency.

## 2015-08-10 NOTE — Progress Notes (Signed)
Recreation Therapy Notes  Animal-Assisted Activity (AAA) Program Checklist/Progress Notes Patient Eligibility Criteria Checklist & Daily Group note for Rec Tx Intervention  Date: 08.23.2016 Time: 2:45pm Location: [address]    AAA/T Program Assumption of Risk Form signed by Patient/ or Parent Legal Guardian yes  Patient is free of allergies or sever asthma yes  Patient reports no fear of animals yes  Patient reports no history of cruelty to animals yes  Patient understands his/her participation is voluntary yes  Patient washes hands before animal contact yes  Patient washes hands after animal contact yes  Behavioral Response: Did not attend.   Marykay Lex Donyetta Ogletree, LRT/CTRS  Mackenzi Krogh L 08/10/2015 3:03 PM

## 2015-08-11 NOTE — Progress Notes (Signed)
[facility] MD Progress Note  08/11/2015 7:31 PM Ryan Long  MRN:  [id number] Subjective:  Ryan Long endorses that he is starting to feel better. He is planning to go to stay with a friend who is clean and see how he can get himself back on his feet. States he has his disability but would like to work couple of hours to stay busy and make couple of more dollars. He states he is committed to abstinence. Principal Problem: MDD (major depressive disorder), recurrent, severe, with psychosis Diagnosis:   Patient Active Problem List   Diagnosis Date Noted  . MDD (major depressive disorder), recurrent, severe, with psychosis [F33.3] 08/07/2015  . PTSD (post-traumatic stress disorder) [F43.10] 08/07/2015  . Cocaine use disorder, severe, dependence [F14.20] 08/07/2015  . Alcohol use disorder, severe, dependence [F10.20] 08/07/2015  . Tobacco use disorder [Z72.0] 08/07/2015  . Suicidal ideation [R45.851]   . Polysubstance (excluding opioids) dependence with physiological dependence [F19.20] 12/30/2013  . Influenza with other respiratory manifestations [J11.89] 12/22/2013  . Substance induced mood disorder [F19.94] 11/22/2013  . Cocaine abuse, episodic use [F14.10] 03/20/2012    Class: Acute  . Alcohol dependence [F10.20] 03/20/2012    Class: Acute  . Chronic systolic heart failure [I50.22]   . Cardiomyopathy, nonischemic [I42.9] 08/24/2011  . HIV DISEASE [B20] 02/01/2009  . HSV [B00.9] 02/01/2009  . HYPERLIPIDEMIA [E78.5] 02/01/2009  . ALLERGIC RHINITIS [J30.9] 02/01/2009   Total Time spent with patient: 30 minutes   Past Medical History:  Past Medical History  Diagnosis Date  . HIV (human immunodeficiency virus infection)   . Chronic systolic heart failure   . Hypertension   . Genital herpes   . Active smoker   . AR (allergic rhinitis)   . HLD (hyperlipidemia)   . NICM (nonischemic cardiomyopathy)     Echocardiogram 06/28/11: EF 30-35%, mild MR, mild LAE;  No CAD by coronary CT angiogram 3/12  at [facility]  . NSVT (nonsustained ventricular tachycardia)   . Depression   . Bipolar 1 disorder   . Alcohol abuse   . Crack cocaine use   . COPD (chronic obstructive pulmonary disease)   . CHF (congestive heart failure)   . Anxiety   . PTSD (post-traumatic stress disorder)    History reviewed. No pertinent past surgical history. Family History:  Family History  Problem Relation Age of Onset  . Hypertension Father   . CAD Father    Social History:  History  Alcohol Use  . Yes    Comment: sporadic; approximately every 2 -3 weeks     History  Drug Use  . Yes  . Special: Cocaine    Comment: reports no recent use but hx of THC and cocaine    Social History   Social History  . Marital Status: Single    Spouse Name: N/A  . Number of Children: N/A  . Years of Education: N/A   Social History Main Topics  . Smoking status: Current Every Day Smoker -- 1.00 packs/day for 30 years    Types: Cigarettes  . Smokeless tobacco: Never Used     Comment: 15 cigarettes daily  . Alcohol Use: Yes     Comment: sporadic; approximately every 2 -3 weeks  . Drug Use: Yes    Special: Cocaine     Comment: reports no recent use but hx of THC and cocaine  . Sexual Activity: Yes    Birth Control/ Protection: Condom     Comment: pt. given condoms   Other  Topics Concern  . None   Social History Narrative   Additional History:    Sleep: Fair  Appetite:  Fair   Assessment:   Musculoskeletal: Strength & Muscle Tone: within normal limits Gait & Station: normal Patient leans: normal   Psychiatric Specialty Exam: Physical Exam  Review of Systems  Constitutional: Negative.   HENT: Negative.   Eyes: Negative.   Respiratory: Negative.   Cardiovascular: Negative.   Gastrointestinal: Negative.   Genitourinary: Negative.   Musculoskeletal: Negative.   Skin: Negative.   Neurological: Negative.   Endo/Heme/Allergies: Negative.   Psychiatric/Behavioral: Positive  for depression and substance abuse.    Blood pressure 113/69, pulse 88, temperature 98 F (36.7 C), temperature source Oral, resp. rate 18.There is no weight on file to calculate BMI.  General Appearance: Fairly Groomed  Patent attorney::  Fair  Speech:  Clear and Coherent  Volume:  Decreased  Mood:  Anxious and worried  Affect:  Restricted  Thought Process:  Coherent and Goal Directed  Orientation:  Full (Time, Place, and Person)  Thought Content:  plans worries concerns  Suicidal Thoughts:  No  Homicidal Thoughts:  No  Memory:  Immediate;   Fair Recent;   Fair Remote;   Fair  Judgement:  Fair  Insight:  Present  Psychomotor Activity:  Normal  Concentration:  Fair  Recall:  [employer] of Knowledge:Fair  Language: Fair  Akathisia:  No  Handed:  Right  AIMS (if indicated):     Assets:  Desire for Improvement  ADL's:  Intact  Cognition: WNL  Sleep:  Number of Hours: 5.5     Current Medications: Current Facility-Administered Medications  Medication Dose Route Frequency Provider Last Rate Last Dose  . albuterol (PROVENTIL HFA;VENTOLIN HFA) 108 (90 BASE) MCG/ACT inhaler 2 puff  2 puff Inhalation Q4H PRN Shuvon B Rankin, NP      . alum & mag hydroxide-simeth (MAALOX/MYLANTA) 200-200-20 MG/5ML suspension 30 mL  30 mL Oral Q4H PRN Shuvon B Rankin, NP      . aspirin chewable tablet 81 mg  81 mg Oral q morning - 10a Shuvon B Rankin, NP   81 mg at 08/11/15 1013  . buPROPion (WELLBUTRIN XL) 24 hr tablet 150 mg  150 mg Oral Daily Shuvon B Rankin, NP   150 mg at 08/11/15 0951  . digoxin (LANOXIN) tablet 125 mcg  125 mcg Oral Daily Shuvon B Rankin, NP   125 mcg at 08/11/15 0951  . emtricitabine-tenofovir (TRUVADA) 200-300 MG per tablet 1 tablet  1 tablet Oral Daily Shuvon B Rankin, NP   1 tablet at 08/11/15 0926  . furosemide (LASIX) tablet 40 mg  40 mg Oral Daily Shuvon B Rankin, NP   40 mg at 08/11/15 1610  . hydrOXYzine (ATARAX/VISTARIL) tablet 25 mg  25 mg Oral TID PRN Shuvon B Rankin,  NP   25 mg at 08/10/15 2259  . isosorbide dinitrate (ISORDIL) tablet 5 mg  5 mg Oral TID Shuvon B Rankin, NP   5 mg at 08/11/15 1713  . lisinopril (PRINIVIL,ZESTRIL) tablet 2.5 mg  2.5 mg Oral Daily Shuvon B Rankin, NP   2.5 mg at 08/11/15 0929  . magnesium hydroxide (MILK OF MAGNESIA) suspension 30 mL  30 mL Oral Daily PRN Shuvon B Rankin, NP      . metoprolol succinate (TOPROL-XL) 24 hr tablet 25 mg  25 mg Oral Daily Shuvon B Rankin, NP   25 mg at 08/11/15 0929  . nicotine (NICODERM CQ - dosed  in mg/24 hours) patch 21 mg  21 mg Transdermal Daily Rachael Fee, MD   21 mg at 08/11/15 0955  . potassium chloride (K-DUR,KLOR-CON) CR tablet 10 mEq  10 mEq Oral Daily Shuvon B Rankin, NP   10 mEq at 08/11/15 0930  . raltegravir (ISENTRESS) tablet 400 mg  400 mg Oral BID Shuvon B Rankin, NP   400 mg at 08/11/15 1713  . spironolactone (ALDACTONE) tablet 12.5 mg  12.5 mg Oral Daily Shuvon B Rankin, NP   12.5 mg at 08/11/15 0955  . traZODone (DESYREL) tablet 150 mg  150 mg Oral QHS Rachael Fee, MD   150 mg at 08/10/15 2257  . valACYclovir (VALTREX) tablet 500 mg  500 mg Oral BID Shuvon B Rankin, NP   500 mg at 08/11/15 1713    Lab Results: No results found for this or any previous visit (from the past 48 hour(s)).  Physical Findings: AIMS: Facial and Oral Movements Muscles of Facial Expression: None, normal Lips and Perioral Area: None, normal Jaw: None, normal Tongue: None, normal,Extremity Movements Upper (arms, wrists, hands, fingers): None, normal Lower (legs, knees, ankles, toes): None, normal, Trunk Movements Neck, shoulders, hips: None, normal, Overall Severity Severity of abnormal movements (highest score from questions above): None, normal Incapacitation due to abnormal movements: None, normal Patient's awareness of abnormal movements (rate only patient's report): No Awareness, Dental Status Current problems with teeth and/or dentures?: No Does patient usually wear dentures?: No  CIWA:     COWS:     Treatment Plan Summary: Daily contact with patient to assess and evaluate symptoms and progress in treatment and Medication management Supportive approach/coping skills Alcohol cocaine dependence: continue to work a relapse prevention plan Depression; continue the Wellbutrin XL 150 mg in AM Insomnia; continue the Trazodone 150 mg HS with the Vistaril 25 mg Continue to work with CBT/mindfulness Medical Decision Making:  Review of Psycho-Social Stressors (1) and Review of Medication Regimen & Side Effects (2)     Shelton Soler A 08/11/2015, 7:31 PM

## 2015-08-11 NOTE — Progress Notes (Signed)
Patient ID: Ryan Long, male   DOB: 1961/11/14, 54 y.o.   MRN: [id number]  D: Patient smiling on approach. Remembers undersigned from last week. Reports his mood is much better than at that time. No agitation or paranoia noted during conversation. Contracts for safety on the unit. Went to 8pm group tonight. A: Staff will monitor on q 15 minute checks, follow treatment plan, and give meds as ordered. R: Cooperative on the unit.

## 2015-08-11 NOTE — Progress Notes (Signed)
D: Patient is alert and pleasant.  Refused to participate in milieu and therapy.  Denies visual and auditory hallucination.  Denies any pain and discomfort.   A:  Patient complied with medication administration after several encouragements.  Maintained on routine safety checks per protocol. R:  No complaints offered at this time.  Contract for safety.

## 2015-08-11 NOTE — [facility] Group Notes (Signed)
[facility] LCSW Group Therapy  08/11/2015 1:14 PM  Type of Therapy:  Group Therapy  Participation Level:  Active  Participation Quality:  Attentive  Affect:  Appropriate  Cognitive:  Alert and Oriented  Insight:  Engaged  Engagement in Therapy:  Engaged  Modes of Intervention:  Confrontation, Discussion, Education, Exploration, Problem-solving, Rapport Building, Socialization and Support  Summary of Progress/Problems: Today's Topic: Overcoming Obstacles. Patients identified one short term goal and potential obstacles in reaching this goal. Patients processed barriers involved in overcoming these obstacles. Patients identified steps necessary for overcoming these obstacles and explored motivation (internal and external) for facing these difficulties head on. Tranell was attentive and engaged during today's processing group. He shared that his biggest obstacle involves "getting out of my own head." PT talked about self sabotage and his tendency to get close to meeting a goal, then ruining it by relapsing or having a negative attitude.   Smart, Blakely Maranan LCSWA 08/11/2015, 1:14 PM

## 2015-08-11 NOTE — Clinical Social Work Note (Signed)
CSW spoke with Casimiro Needle at [facility]--unknown if bed will be available until tonight. He stated that pt is welcome to come and see if bed is available and stated that it does not matter if pt stayed max amount of [facility] in the past "that doesn't matter anymore. If we have the bed space, we're more than happy to help."   Trula Slade, LCSWA Clinical Social Worker 08/11/2015 8:43 AM

## 2015-08-11 NOTE — [facility] Group Notes (Signed)
[facility] LCSW Aftercare Discharge Planning Group Note   08/11/2015 11:05 AM  Participation Quality:  Invited-did not attend.   Smart, [employer]

## 2015-08-11 NOTE — Progress Notes (Addendum)
Patient attend N/A group tonight.  

## 2015-08-11 NOTE — Progress Notes (Signed)
Pt depressed in mood with flat affect.  Pt sated his day was "better than yesterday".  Pt shared he hopes to be leaving soon.  Pt report some anxiety but no other withdrawal symptoms.  Pt reported he did not sleep well last night and was instructed to take PRN vistaril along with scheduled trazodone. Pt was given both.  Pt was observed in milieu with peers and attended group this evening. Support and encouragement provided, pt receptive.  Pt remains safe on the unit.

## 2015-08-12 ENCOUNTER — Encounter (HOSPITAL_COMMUNITY): Payer: Self-pay | Admitting: Registered Nurse

## 2015-08-12 MED ORDER — TRAZODONE HCL 150 MG PO TABS: 150.0000 mg | ORAL_TABLET | Freq: Every day | ORAL | Status: DC

## 2015-08-12 MED ORDER — BUPROPION HCL ER (XL) 150 MG PO TB24: 150.0000 mg | ORAL_TABLET | Freq: Every day | ORAL | Status: DC

## 2015-08-12 MED ORDER — HYDROXYZINE HCL 25 MG PO TABS: 25.0000 mg | ORAL_TABLET | Freq: Three times a day (TID) | ORAL | Status: DC | PRN

## 2015-08-12 MED ORDER — ALBUTEROL SULFATE HFA 108 (90 BASE) MCG/ACT IN AERS: 2.0000 | INHALATION_SPRAY | RESPIRATORY_TRACT | Status: DC | PRN

## 2015-08-12 NOTE — [facility] Suicide Risk Assessment (Signed)
[facility] INPATIENT:  Family/Significant Other Suicide Prevention Education  Suicide Prevention Education:  Contact Attempts: Janey Greaser (pt's friend) [phone] identified by the patient as the family member/significant other with whom the patient will be residing, and identified as the person(s) who will aid the patient in the event of a mental health crisis.  With written consent from the patient, two attempts were made to provide suicide prevention education, prior to and/or following the patient's discharge.  We were unsuccessful in providing suicide prevention education.  A suicide education pamphlet was given to the patient to share with family/significant other.  Date and time of first attempt: 04/11/15 at 3:30PM (message left requesting call back at earliest convenience).  Date and time of second attempt: 04/12/15 at 9:10AM (message left requesting call back at earliest convenience).   Smart, Juliett Eastburn LCSWA  08/12/2015, 9:13 AM

## 2015-08-12 NOTE — Progress Notes (Signed)
  [facility] Adult Case Management Discharge Plan :  Will you be returning to the same living situation after discharge:  No.pt will stay with his friend Fulton Mole at d/c until his disability check comes.  At discharge, do you have transportation home?: Yes,  cab--pt has several heavy bags and cannot carry to bus. Pt's friend will pick him up at bus depot. Do you have the ability to pay for your medications: Yes,  Medicaid  Release of information consent forms completed and submitted to medical records by CSW.  Patient to Follow up at: Follow-up Information    [facility]  On 08/17/2015.   Why:  Walk in between 9am-12pm on this date for assessment (therapy/[facility]stance [facility])    Contact information:   [address] [place], [place] [address] Phone: [phone] Fax: [phone]      Follow up with [facility].   Why:  Walk in between 8am-9am Monday through Friday for hospital follow-up/medication management/assessment for mental health services.    Contact information:   [address], [place] [address] Phone: [phone] Fax: [phone]      Follow up with [facility]  On 08/18/2015.   Why:  Appt on this date at 10:45am for hospital follow-up/primary care. Please arrive at 10:30am to complete new patient information and bring photo ID.    Contact information:   [address] [place], [place] [address] Phone: [phone] Fax: [phone]      Patient denies SI/HI: Yes,  during group/self report.    Safety Planning and Suicide Prevention discussed: Yes,  Contact attempts made with pt's friend. SPE completed with pt.  Have you used any form of tobacco in the last 30 days? (Cigarettes, Smokeless Tobacco, Cigars, and/or Pipes): Yes  Has patient been referred to the Quitline?: Yes, faxed on 08/12/15  SmartHerbert Seta LCSWA 08/12/2015, 10:13 AM

## 2015-08-12 NOTE — Progress Notes (Signed)
Discharge Note:  Patient is alert and cooperative.  Denies SI/HI/AVH and pain.  Patient discharge home with prescriptions and sample medications.  Discharge instructions and prescriptions reviewed with patient.  Verbalizes discharge readiness and understanding.  Personal belongings returned to patient.

## 2015-08-12 NOTE — [facility] Suicide Risk Assessment (Signed)
[facility] Discharge Suicide Risk Assessment   Demographic Factors:  Male  Total Time spent with patient: 30 minutes  Musculoskeletal: Strength & Muscle Tone: within normal limits Gait & Station: normal Patient leans: normal  Psychiatric Specialty Exam: Physical Exam  Review of Systems  Constitutional: Negative.   HENT: Negative.   Eyes: Negative.   Respiratory: Negative.   Cardiovascular: Negative.   Gastrointestinal: Negative.   Genitourinary: Negative.   Musculoskeletal: Negative.   Skin: Negative.   Neurological: Negative.   Endo/Heme/Allergies: Negative.   Psychiatric/Behavioral: Positive for depression and substance abuse.    Blood pressure 93/70, pulse 96, temperature 97.8 F (36.6 C), temperature source Oral, resp. rate 20, height 5\' 9"  (1.753 m), weight 86 kg (189 lb 9.5 oz).Body mass index is 27.99 kg/(m^2).  General Appearance: Fairly Groomed  Patent attorney::  Fair  Speech:  Clear and Coherent409  Volume:  Normal  Mood:  Euthymic  Affect:  Appropriate  Thought Process:  Coherent and Goal Directed  Orientation:  Full (Time, Place, and Person)  Thought Content:  symptoms events worries concerns  Suicidal Thoughts:  No  Homicidal Thoughts:  No  Memory:  Immediate;   Fair Recent;   Fair Remote;   Fair  Judgement:  Fair  Insight:  Present  Psychomotor Activity:  Normal  Concentration:  Fair  Recall:  [employer] of Knowledge:Fair  Language: Fair  Akathisia:  No  Handed:  Right  AIMS (if indicated):     Assets:  Desire for Improvement  Sleep:  Number of Hours: 5.5  Cognition: WNL  ADL's:  Intact   Have you used any form of tobacco in the last 30 days? (Cigarettes, Smokeless Tobacco, Cigars, and/or Pipes): Yes  Has this patient used any form of tobacco in the last 30 days? (Cigarettes, Smokeless Tobacco, Cigars, and/or Pipes) Yes, Prescription not provided because: nicotine patches will be given  Mental Status Per Nursing Assessment::   On Admission:   Self-harm thoughts  Current Mental Status by Physician: In full contact with reality. There are no active SI plans or intent. He states he is committed to abstinence. He will be staying with a sober friend while he gets more permanent accomodations   Loss Factors: Decline in physical health  Historical Factors: NA  Risk Reduction Factors:   Positive social support  Continued Clinical Symptoms:  Depression:   Comorbid alcohol abuse/dependence Alcohol/Substance Abuse/Dependencies  Cognitive Features That Contribute To Risk:  Closed-mindedness, Polarized thinking and Thought constriction (tunnel vision)    Suicide Risk:  Minimal: No identifiable suicidal ideation.  Patients presenting with no risk factors but with morbid ruminations; may be classified as minimal risk based on the severity of the depressive symptoms  Principal Problem: MDD (major depressive disorder), recurrent, severe, with psychosis Discharge Diagnoses:  Patient Active Problem List   Diagnosis Date Noted  . MDD (major depressive disorder), recurrent, severe, with psychosis [F33.3] 08/07/2015  . PTSD (post-traumatic stress disorder) [F43.10] 08/07/2015  . Cocaine use disorder, severe, dependence [F14.20] 08/07/2015  . Alcohol use disorder, severe, dependence [F10.20] 08/07/2015  . Tobacco use disorder [Z72.0] 08/07/2015  . Suicidal ideation [R45.851]   . Polysubstance (excluding opioids) dependence with physiological dependence [F19.20] 12/30/2013  . Influenza with other respiratory manifestations [J11.89] 12/22/2013  . Substance induced mood disorder [F19.94] 11/22/2013  . Cocaine abuse, episodic use [F14.10] 03/20/2012    Class: Acute  . Alcohol dependence [F10.20] 03/20/2012    Class: Acute  . Chronic systolic heart failure [I50.22]   .  Cardiomyopathy, nonischemic [I42.9] 08/24/2011  . HIV DISEASE [B20] 02/01/2009  . HSV [B00.9] 02/01/2009  . HYPERLIPIDEMIA [E78.5] 02/01/2009  . ALLERGIC RHINITIS [J30.9]  02/01/2009    Follow-up Information    [facility]  On 08/17/2015.   Why:  Walk in between 9am-12pm on this date for assessment (therapy/[facility]stance Abusive [facility] Out[facility])    Contact information:   [address] [place], [place] [address] Phone: [phone] Fax: [phone]      Follow up with [facility].   Why:  Walk in between 8am-9am Monday through Friday for hospital follow-up/medication management/assessment for mental health services.    Contact information:   [address], [place] [address] Phone: [phone] Fax: [phone]      Follow up with [facility]  On 08/18/2015.   Why:  Appt on this date at 10:45am for hospital follow-up/primary care. Please arrive at 10:30am to complete new patient information and bring photo ID.    Contact information:   [address] [place], [place] [address] Phone: [phone] Fax: [phone]      Plan Of Care/Follow-up recommendations:  Activity:  as tolerated Diet:  regular Follow up as above Is patient on multiple antipsychotic therapies at discharge:  No   Has Patient had three or more failed trials of antipsychotic monotherapy by history:  No  Recommended Plan for Multiple Antipsychotic Therapies: NA    Madigan Rosensteel A 08/12/2015, 10:40 AM

## 2015-08-12 NOTE — Discharge Summary (Signed)
Physician Discharge Summary Note  Patient:  Ryan Long is an 54 y.o., male MRN:  [id number] DOB:  12-10-1961 Patient phone:  [phone] (home)  Patient address:   [address] [place] [address],  Total Time spent with patient: 45 minutes  Date of Admission:  08/06/2015 Date of Discharge: 08/12/2015  Reason for Admission:  Per H&P Note:  Ryan Long, 54 y.o. male patient admitted with Substance abuse and worsening depression. He was at [facility] last Sept 2015 for same. He had reported several stressors such as homelessness, non compliance with medications both for mental health and his HIV. He states he had been thinking of killing himself by stepping in front of a truck. He denies HI or A/V hallucinations. He does report using ETOH and cocaine about two days ago.   Patient does not have outpatient MH provider. Furthermore, he has been off medications for the last two weeks or so. He reportedly was getting medications filled by [facility]. Patient has been at [facility] multiple times in the last couple years.    Principal Problem: MDD (major depressive disorder), recurrent, severe, with psychosis Discharge Diagnoses: Patient Active Problem List   Diagnosis Date Noted  . MDD (major depressive disorder), recurrent, severe, with psychosis [F33.3] 08/07/2015  . PTSD (post-traumatic stress disorder) [F43.10] 08/07/2015  . Cocaine use disorder, severe, dependence [F14.20] 08/07/2015  . Alcohol use disorder, severe, dependence [F10.20] 08/07/2015  . Tobacco use disorder [Z72.0] 08/07/2015  . Suicidal ideation [R45.851]   . Polysubstance (excluding opioids) dependence with physiological dependence [F19.20] 12/30/2013  . Influenza with other respiratory manifestations [J11.89] 12/22/2013  . Substance induced mood disorder [F19.94] 11/22/2013  . Cocaine abuse, episodic use [F14.10] 03/20/2012    Class: Acute  . Alcohol dependence [F10.20] 03/20/2012    Class: Acute  .  Chronic systolic heart failure [I50.22]   . Cardiomyopathy, nonischemic [I42.9] 08/24/2011  . HIV DISEASE [B20] 02/01/2009  . HSV [B00.9] 02/01/2009  . HYPERLIPIDEMIA [E78.5] 02/01/2009  . ALLERGIC RHINITIS [J30.9] 02/01/2009    Musculoskeletal: Strength & Muscle Tone: within normal limits Gait & Station: normal Patient leans: N/A  Psychiatric Specialty Exam: See Suicide Risk Assessment Physical Exam  Nursing note and vitals reviewed. Constitutional: He is oriented to person, place, and time.  Neck: Normal range of motion.  Respiratory: Effort normal.  Musculoskeletal: Normal range of motion.  Neurological: He is alert and oriented to person, place, and time.    Review of Systems  Psychiatric/Behavioral: Negative for substance abuse. Depression: stable. Suicidal ideas: Stable. Nervous/anxious: Stable. Insomnia: Stable.   All other systems reviewed and are negative.   Blood pressure 93/70, pulse 96, temperature 97.8 F (36.6 C), temperature source Oral, resp. rate 20, height 5\' 9"  (1.753 m), weight 86 kg (189 lb 9.5 oz).Body mass index is 27.99 kg/(m^2).  Have you used any form of tobacco in the last 30 days? (Cigarettes, Smokeless Tobacco, Cigars, and/or Pipes): Yes  Has this patient used any form of tobacco in the last 30 days? (Cigarettes, Smokeless Tobacco, Cigars, and/or Pipes) Yes, Prescription not provided because: patient already prescribed  Past Medical History:  Past Medical History  Diagnosis Date  . HIV (human immunodeficiency virus infection)   . Chronic systolic heart failure   . Hypertension   . Genital herpes   . Active smoker   . AR (allergic rhinitis)   . HLD (hyperlipidemia)   . NICM (nonischemic cardiomyopathy)     Echocardiogram 06/28/11: EF 30-35%, mild MR, mild LAE;  No  CAD by coronary CT angiogram 3/12 at [facility]  . NSVT (nonsustained ventricular tachycardia)   . Depression   . Bipolar 1 disorder   . Alcohol abuse   . Crack  cocaine use   . COPD (chronic obstructive pulmonary disease)   . CHF (congestive heart failure)   . Anxiety   . PTSD (post-traumatic stress disorder)    History reviewed. No pertinent past surgical history. Family History:  Family History  Problem Relation Age of Onset  . Hypertension Father   . CAD Father    Social History:  History  Alcohol Use  . Yes    Comment: sporadic; approximately every 2 -3 weeks     History  Drug Use  . Yes  . Special: Cocaine    Comment: reports no recent use but hx of THC and cocaine    Social History   Social History  . Marital Status: Single    Spouse Name: N/A  . Number of Children: N/A  . Years of Education: N/A   Social History Main Topics  . Smoking status: Current Every Day Smoker -- 1.00 packs/day for 30 years    Types: Cigarettes  . Smokeless tobacco: Never Used     Comment: 15 cigarettes daily  . Alcohol Use: Yes     Comment: sporadic; approximately every 2 -3 weeks  . Drug Use: Yes    Special: Cocaine     Comment: reports no recent use but hx of THC and cocaine  . Sexual Activity: Yes    Birth Control/ Protection: Condom     Comment: pt. given condoms   Other Topics Concern  . None   Social History Narrative    Risk to Self: Is patient at risk for suicide?: Yes What has been your use of drugs/alcohol within the last 12 months?: Alcohol and crack cocaine binges on monthly basis, will use heavy for several days until his money runs out; last use occurred Mon 8/15 through Wed 8/17 Risk to Others:   Prior Inpatient Therapy:   Prior Outpatient Therapy:    Level of Care:  OP  Hospital Course:  Ryan Long was admitted for MDD (major depressive disorder), recurrent, severe, with psychosis and crisis management.  He was treated discharged with the medications listed below under Medication List.  Medical problems were identified and treated as needed.  Home medications were restarted as appropriate.  Improvement was  monitored by observation and Ryan Long.  Emotional and mental status was monitored by daily self-inventory reports completed by Ryan Long and clinical staff.         Ryan Long was evaluated by the treatment team for stability and plans for continued recovery upon discharge.  Ryan Long was an integral factor for scheduling further treatment.  Employment, transportation, bed availability, health status, family support, and any pending legal issues were also considered during his hospital stay.  He was offered further treatment options upon discharge including but not limited to Residential, Intensive Outpatient, and Outpatient treatment.  Ryan Long with the services as listed below under Follow Long Information.     Upon completion of this admission the patient was both mentally and medically stable for discharge denying suicidal/homicidal ideation, auditory/visual/tactile hallucinations, delusional thoughts and paranoia.      Consults:  psychiatry  Significant Diagnostic Studies:  labs: T4 free, T3, Lipid panel, Prolactin, HgbA1c, RPR, CMET, CBC  Discharge Vitals:   Blood pressure 93/70,  pulse 96, temperature 97.8 F (36.6 C), temperature source Oral, resp. rate 20, height  (1.753 m), weight 86 kg (189 lb 9.5 oz). Body mass index is 27.99 kg/(m^2). Lab Results:   No results found for this or any previous visit (from the past 72 hour(s)).  Physical Findings: AIMS: Facial and Oral Movements Muscles of Facial Expression: None, normal Lips and Perioral Area: None, normal Jaw: None, normal Tongue: None, normal,Extremity Movements Upper (arms, wrists, hands, fingers): None, normal Lower (legs, knees, ankles, toes): None, normal, Trunk Movements Neck, shoulders, hips: None, normal, Overall Severity Severity of abnormal movements (highest score from questions above): None, normal Incapacitation due to abnormal movements:  None, normal Patient's awareness of abnormal movements (rate only patient's report): No Awareness, Dental Status Current problems with teeth and/or dentures?: No Does patient usually wear dentures?: No  CIWA:    COWS:      See Psychiatric Specialty Exam and Suicide Risk Assessment completed by Attending Physician prior to discharge.  Discharge destination:  Home  Is patient on multiple antipsychotic therapies at discharge:  No   Has Patient had three or more failed trials of antipsychotic monotherapy by history:  No    Recommended Plan for Multiple Antipsychotic Therapies: NA      Discharge Instructions    Activity as tolerated - No restrictions    Complete by:  As directed      Diet general    Complete by:  As directed      Discharge instructions    Complete by:  As directed   Take all of you medications as prescribed by your mental healthcare provider.  Report any adverse effects and reactions from your medications to your outpatient provider promptly. Do not engage in alcohol and or illegal drug use while on prescription medicines. In the event of worsening symptoms call the crisis hotline, 911, and or go to the nearest emergency department for appropriate evaluation and treatment of symptoms. Follow-Long with your primary care provider for your medical issues, concerns and or health care needs.   Keep all scheduled appointments.  If you are unable to keep an appointment call to reschedule.  Let the nurse know if you will need medications before next scheduled appointment.            Medication List    STOP taking these medications        doxycycline 100 MG capsule  Commonly known as:  VIBRAMYCIN     ibuprofen 200 MG tablet  Commonly known as:  ADVIL,MOTRIN     risperiDONE 0.5 MG tablet  Commonly known as:  RISPERDAL      TAKE these medications      Indication   acetaminophen 500 MG tablet  Commonly known as:  TYLENOL  Take 1,000 mg by mouth every 6 (six)  hours as needed for mild pain.      albuterol 108 (90 BASE) MCG/ACT inhaler  Commonly known as:  PROVENTIL HFA;VENTOLIN HFA  Inhale 2 puffs into the lungs every 4 (four) hours as needed for shortness of breath.   Indication:  Asthma     aspirin 81 MG chewable tablet  Chew 1 tablet (81 mg total) by mouth every morning. For heart health   Indication:  Health health     buPROPion 150 MG 24 hr tablet  Commonly known as:  WELLBUTRIN XL  Take 1 tablet (150 mg total) by mouth daily.   Indication:  Major Depressive Disorder     digoxin  0.125 MG tablet  Commonly known as:  LANOXIN  Take 1 tablet (125 mcg total) by mouth daily. For control of heart arrythmia.   Indication:  Angina Pectoris, Chronic Atrial Fibrillation, Congestive Heart Failure     emtricitabine-tenofovir 200-300 MG per tablet  Commonly known as:  TRUVADA  Take 1 tablet by mouth daily. For HIV infection   Indication:  HIV Disease     furosemide 40 MG tablet  Commonly known as:  LASIX  Take 1 tablet (40 mg total) by mouth daily. For edema   Indication:  Edema, High Blood Pressure     hydrOXYzine 25 MG tablet  Commonly known as:  ATARAX/VISTARIL  Take 1 tablet (25 mg total) by mouth 3 (three) times daily as needed for anxiety.   Indication:  Tension, Anxiety     isosorbide dinitrate 5 MG tablet  Commonly known as:  ISORDIL  Take 1 tablet (5 mg total) by mouth 3 (three) times daily. For heart condition   Indication:  Chronic Angina Pectoris, Congestive Heart Failure Resistant to Treatment     lisinopril 2.5 MG tablet  Commonly known as:  PRINIVIL,ZESTRIL  Take 1 tablet (2.5 mg total) by mouth daily. For high blood pressure   Indication:  High Blood Pressure     metoprolol succinate 25 MG 24 hr tablet  Commonly known as:  TOPROL-XL  Take 1 tablet (25 mg total) by mouth daily. For high blood pressure   Indication:  High Blood Pressure     MULTI-VITAMINS Tabs  Take 1 tablet by mouth daily. For low vitamin    Indication:  Low vitamin     nicotine 21 mg/24hr patch  Commonly known as:  RA NICOTINE  Place 1 patch (21 mg total) onto the skin daily. For nicotine addiction   Indication:  Nicotine Addiction     potassium chloride 10 MEQ tablet  Commonly known as:  K-DUR,KLOR-CON  Take 1 tablet (10 mEq total) by mouth daily. For low potassium   Indication:  Low Amount of Potassium in the Blood     raltegravir 400 MG tablet  Commonly known as:  ISENTRESS  Take 1 tablet (400 mg total) by mouth 2 (two) times daily. For HIV infection   Indication:  HIV Disease     spironolactone 25 MG tablet  Commonly known as:  ALDACTONE  Take 0.5 tablets (12.5 mg total) by mouth daily. For heart condition   Indication:  Congestive Heart Failure     traZODone 150 MG tablet  Commonly known as:  DESYREL  Take 1 tablet (150 mg total) by mouth at bedtime.   Indication:  Trouble Sleeping     valACYclovir 500 MG tablet  Commonly known as:  VALTREX  Take 1 tablet (500 mg total) by mouth 2 (two) times daily. For Herpes.   Indication:  Herpes       Follow-Long Information    [facility] with [facility]  On 08/17/2015.   Why:  Walk in between 9am-12pm on this date for assessment (therapy/Substance Abusive Intenstive Outpatient)    Contact information:   [address] [place], [place] [address] Phone: [phone] Fax: [phone]      Follow Long with [facility].   Why:  Walk in between 8am-9am Monday through Friday for hospital follow-Long/medication management/assessment for mental health services.    Contact information:   [address] [place] [address] Phone: [phone] Fax: [phone]      Follow Long with  [facility]  On 08/18/2015.   Why:  Appt on this date at 10:45am for hospital follow-Long/primary care. Please arrive at 10:30am to complete new patient information and bring photo ID.    Contact information:   [address] [place], [place]  [address] Phone: [phone] Fax: [phone]      Follow-Long recommendations:  Activity:  As tolerated Diet:  Low sodium  Comments:   Patient has been instructed to take medications as prescribed; and report adverse effects to outpatient provider.  Follow Long with primary doctor for any medical issues and If symptoms recur report to nearest emergency or crisis hot line.    Total Discharge Time: 45 minutes  Signed: Assunta Found, FNP-BC 08/12/2015, 3:19 PM  I personally assessed the patient and formulated the plan Madie Reno A. Dub Mikes, M.D.

## 2015-08-12 NOTE — Tx Team (Signed)
Interdisciplinary Treatment Plan Update (Adult)  Date:  08/12/2015  Time Reviewed:  10:20 AM   Progress in Treatment: Attending groups: Yes  Participating in groups: Yes Taking medication as prescribed:  Yes. Tolerating medication:  Yes. Family/Significant othe contact made:  Contact attempts made with pt's friend. SPE completed with pt.  Patient understands diagnosis:  Yes. and As evidenced by:  seeking treatment for ETOH detox, crack cocaine abuse, depression, and for medication stabilization. Discussing patient identified problems/goals with staff:  Yes. Medical problems stabilized or resolved:  Yes. Denies suicidal/homicidal ideation: Yes. Issues/concerns per patient self-inventory:  Other:  Discharge Plan or Barriers: Pt plans to stay with his friend until he has money saved for apt. Pt plans to follow up at [facility] for med management, [facility] for [facility]/therapy, and has apt at [facility] and wellness for Primary care appt.   Reason for Continuation of Hospitalization: none  Comments:  Ryan Long is an 54 y.o. male. Patient was recently made homeless. He is depressed and has been thinking of killing himself by stepping in front of a truck for the last day.Patient says that he has been homeless for the last two months and staying at a shelter. Patient had been waiting for housing authority to help him get a home. He finally got a place but after about 5 weeks had to move out because of bed bugs. He said that they had infested his new furniture that he had purchased. Was told by housing authority that it would be a year before they could find a place for him. He could not tolerate the bed bugs so he moved out and has been homeless for the last two months.Patient does report suicidal thoughts with plan to kill himself by stepping in front of a truck. He has no HI or A/V hallucinations. He does report using ETOH and cocaine about two days ago. Reports no withdrawal symptoms.   Patient reports that he has no psychiatrist. He has been off medications for the last two weeks or so. He reportedly was getting medications filled by [facility]. Patient has been at [facility] multiple times in the last couple years. Last stay at [facility] was in September of '15.Axis I: Major Depression, single episode and Substance Abuse  Estimated length of stay:  D/c today  Additional Comments:  Patient and CSW reviewed pt's identified goals and treatment plan. Patient verbalized understanding and agreed to treatment plan. CSW reviewed [facility] "Discharge Process and Patient Involvement" Form. Pt verbalized understanding of information provided and signed form.    Review of initial/current patient goals per problem list:  1. Goal(s): Patient will participate in aftercare plan  Met:  Yes  Target date: at discharge  As evidenced by: Patient will participate within aftercare plan AEB aftercare provider and housing plan at discharge being identified.  8/22: CSW assessing. [place] list given. [facility] for follow-up.   8/25: Pt plans to followup at [facility] [place], and [facility] for [place]. He plans to stay with a friend at d/c. Goal met.   2. Goal (s): Patient will exhibit decreased depressive symptoms and suicidal ideations.  Met: No.    Target date: at discharge  As evidenced by: Patient will utilize self rating of depression at 3 or below and demonstrate decreased signs of depression or be deemed stable for discharge by MD.  8/22: Pt rates depression as high. Denies SI/HI/AVH at this time.   8/25: Pt rates depression as low today. Pleasant, calm. Pt denies  SI/HI/AVH.    3. Goal(s): Patient will demonstrate decreased signs of withdrawal due to substance abuse  Met:Yes   Target date:at discharge   As evidenced by: Patient will produce a CIWA/COWS score of 0, have stable vitals signs, and no symptoms of withdrawal.  8/22: Pt denies withdrawal symptoms.  Stable vitals. No CIWA/COWS score. No detox protocol .Goal met.    Attendees: Patient:   08/12/2015 10:20 AM   Family:   08/12/2015 10:20 AM   Physician:  Dr. Carlton Adam, MD 08/12/2015 10:20 AM   Nursing:   Vallery [place]; Clair Gulling RN 08/12/2015 10:20 AM   Clinical Social Worker: Maxie Better, [place]  08/12/2015 10:20 AM   Clinical Social Worker: Erasmo Downer Drinkard LCSWA; Peri Maris LCSWA 08/12/2015 10:20 AM   Other:  Gerline Legacy Nurse Case Manager 08/12/2015 10:20 AM   Other:  Lucinda Dell; [facility]  08/12/2015 10:20 AM   Other:   08/12/2015 10:20 AM   Other:  08/12/2015 10:20 AM   Other:  08/12/2015 10:20 AM   Other:  08/12/2015 10:20 AM    08/12/2015 10:20 AM    08/12/2015 10:20 AM    08/12/2015 10:20 AM    08/12/2015 10:20 AM    Scribe for Treatment Team:   Maxie Better, [place]  08/12/2015 10:20 AM

## 2015-08-13 ENCOUNTER — Other Ambulatory Visit: Payer: Self-pay

## 2015-08-13 ENCOUNTER — Emergency Department (HOSPITAL_COMMUNITY)
Admission: EM | Admit: 2015-08-13 | Discharge: 2015-08-13 | Disposition: A | Discharge status: Home / Self Care | Payer: Medicaid Other | Attending: Emergency Medicine | Admitting: Emergency Medicine

## 2015-08-13 ENCOUNTER — Encounter (HOSPITAL_COMMUNITY): Payer: Self-pay | Admitting: Emergency Medicine

## 2015-08-13 ENCOUNTER — Emergency Department (HOSPITAL_COMMUNITY): Payer: Medicaid Other

## 2015-08-13 DIAGNOSIS — J449 Chronic obstructive pulmonary disease, unspecified: Secondary | ICD-10-CM | POA: Insufficient documentation

## 2015-08-13 DIAGNOSIS — F419 Anxiety disorder, unspecified: Secondary | ICD-10-CM | POA: Insufficient documentation

## 2015-08-13 DIAGNOSIS — D72819 Decreased white blood cell count, unspecified: Secondary | ICD-10-CM | POA: Insufficient documentation

## 2015-08-13 DIAGNOSIS — F329 Major depressive disorder, single episode, unspecified: Secondary | ICD-10-CM | POA: Diagnosis not present

## 2015-08-13 DIAGNOSIS — Z720 Tobacco use: Secondary | ICD-10-CM | POA: Insufficient documentation

## 2015-08-13 DIAGNOSIS — Z79899 Other long term (current) drug therapy: Secondary | ICD-10-CM | POA: Diagnosis not present

## 2015-08-13 DIAGNOSIS — Z8639 Personal history of other endocrine, nutritional and metabolic disease: Secondary | ICD-10-CM | POA: Diagnosis not present

## 2015-08-13 DIAGNOSIS — R0789 Other chest pain: Secondary | ICD-10-CM

## 2015-08-13 DIAGNOSIS — R079 Chest pain, unspecified: Secondary | ICD-10-CM | POA: Diagnosis present

## 2015-08-13 DIAGNOSIS — Z21 Asymptomatic human immunodeficiency virus [HIV] infection status: Secondary | ICD-10-CM | POA: Diagnosis not present

## 2015-08-13 DIAGNOSIS — I10 Essential (primary) hypertension: Secondary | ICD-10-CM | POA: Insufficient documentation

## 2015-08-13 DIAGNOSIS — I5022 Chronic systolic (congestive) heart failure: Secondary | ICD-10-CM | POA: Diagnosis not present

## 2015-08-13 DIAGNOSIS — Z7982 Long term (current) use of aspirin: Secondary | ICD-10-CM | POA: Diagnosis not present

## 2015-08-13 DIAGNOSIS — I509 Heart failure, unspecified: Secondary | ICD-10-CM | POA: Insufficient documentation

## 2015-08-13 DIAGNOSIS — Z8619 Personal history of other infectious and parasitic diseases: Secondary | ICD-10-CM | POA: Diagnosis not present

## 2015-08-13 LAB — BASIC METABOLIC PANEL
ANION GAP: 8 (ref 5–15)
BUN: 18 mg/dL (ref 6–20)
CALCIUM: 8.9 mg/dL (ref 8.9–10.3)
CO2: 25 mmol/L (ref 22–32)
Chloride: 105 mmol/L (ref 101–111)
Creatinine, Ser: 1.19 mg/dL (ref 0.61–1.24)
GFR calc Af Amer: >60 mL/min (ref >60)
GLUCOSE: 98 mg/dL (ref 65–99)
Potassium: 4.1 mmol/L (ref 3.5–5.1)
Sodium: 138 mmol/L (ref 135–145)

## 2015-08-13 LAB — CBC
HCT: 47.3 % (ref 39.0–52.0)
HEMOGLOBIN: 17.4 g/dL — AB (ref 13.0–17.0)
MCH: 34.8 pg — ABNORMAL HIGH (ref 26.0–34.0)
MCHC: 36.8 g/dL — AB (ref 30.0–36.0)
MCV: 94.6 fL (ref 78.0–100.0)
PLATELETS: 154 10*3/uL (ref 150–400)
RBC: 5 MIL/uL (ref 4.22–5.81)
RDW: 13.4 % (ref 11.5–15.5)
WBC: 3.2 10*3/uL — ABNORMAL LOW (ref 4.0–10.5)

## 2015-08-13 LAB — I-STAT TROPONIN, ED: TROPONIN I, POC: 0 ng/mL (ref 0.00–0.08)

## 2015-08-13 LAB — D-DIMER, QUANTITATIVE (NOT AT ARMC): D DIMER QUANT: 0.46 ug{FEU}/mL (ref 0.00–0.48)

## 2015-08-13 MED ORDER — ASPIRIN 81 MG PO CHEW: 81.0000 mg | CHEWABLE_TABLET | Freq: Once | ORAL | Status: DC

## 2015-08-13 MED ORDER — ASPIRIN 81 MG PO CHEW
324.0000 mg | CHEWABLE_TABLET | Freq: Once | ORAL | Status: AC
  Administered 2015-08-13: 324 mg via ORAL
  Filled 2015-08-13: qty 4

## 2015-08-13 NOTE — ED Provider Notes (Signed)
CSN: [id number]     Arrival date & time 08/13/15  0012 History  This chart was scribed for Ryan Booze, MD by Tanda Rockers, ED Scribe. This patient was seen in room D30C/D30C and the patient's care was started at 12:47 AM.  Chief Complaint  Patient presents with  . Chest Pain   The history is provided by the patient. No language interpreter was used.     HPI Comments: Ryan Long is a 54 y.o. male with hx HIV, chronic systolic heart failure, HTN, HLD, COPD, CHF, who presents to the Emergency Department complaining of sudden onset, intermittent, sharp, 5/10, right sided chest pain that began earlier today. Pt notes that the pain lasts about 1 minute before subsiding. The pain is exacerbated with deep breathing. Pt also complains of mild shortness of breath. He believes the chest pain could be attributed to stress after being released from [facility] today and not having a ride to his hotel. He denies nausea, vomiting, diaphoresis, or any other associated symptoms. Pt is active smoker who smokes 0.75 ppd.   Past Medical History  Diagnosis Date  . HIV (human immunodeficiency virus infection)   . Chronic systolic heart failure   . Hypertension   . Genital herpes   . Active smoker   . AR (allergic rhinitis)   . HLD (hyperlipidemia)   . NICM (nonischemic cardiomyopathy)     Echocardiogram 06/28/11: EF 30-35%, mild MR, mild LAE;  No CAD by coronary CT angiogram 3/12 at [facility]  . NSVT (nonsustained ventricular tachycardia)   . Depression   . Bipolar 1 disorder   . Alcohol abuse   . Crack cocaine use   . COPD (chronic obstructive pulmonary disease)   . CHF (congestive heart failure)   . Anxiety   . PTSD (post-traumatic stress disorder)    History reviewed. No pertinent past surgical history. Family History  Problem Relation Age of Onset  . Hypertension Father   . CAD Father    Social History  Substance Use Topics  . Smoking status: Current Every Day  Smoker -- 0.00 packs/day for 30 years    Types: Cigarettes  . Smokeless tobacco: Never Used     Comment: 15 cigarettes daily  . Alcohol Use: Yes    Review of Systems  All other systems reviewed and are negative.  Allergies  Review of patient's allergies indicates no known allergies.  Home Medications   Prior to Admission medications   Medication Sig Start Date End Date Taking? Authorizing Provider  acetaminophen (TYLENOL) 500 MG tablet Take 1,000 mg by mouth every 6 (six) hours as needed for mild pain.   Yes Historical Provider, MD  albuterol (PROVENTIL HFA;VENTOLIN HFA) 108 (90 BASE) MCG/ACT inhaler Inhale 2 puffs into the lungs every 4 (four) hours as needed for shortness of breath. 08/12/15  Yes Shuvon B Rankin, NP  aspirin 81 MG chewable tablet Chew 1 tablet (81 mg total) by mouth every morning. For heart health 08/31/14  Yes Sanjuana Kava, NP  buPROPion (WELLBUTRIN XL) 150 MG 24 hr tablet Take 1 tablet (150 mg total) by mouth daily. 08/12/15  Yes Shuvon B Rankin, NP  digoxin (LANOXIN) 0.125 MG tablet Take 1 tablet (125 mcg total) by mouth daily. For control of heart arrythmia. 08/31/14  Yes Sanjuana Kava, NP  emtricitabine-tenofovir (TRUVADA) 200-300 MG per tablet Take 1 tablet by mouth daily. For HIV infection 08/31/14  Yes Sanjuana Kava, NP  furosemide (LASIX) 40 MG tablet Take  1 tablet (40 mg total) by mouth daily. For edema 08/31/14  Yes Sanjuana Kava, NP  hydrOXYzine (ATARAX/VISTARIL) 25 MG tablet Take 1 tablet (25 mg total) by mouth 3 (three) times daily as needed for anxiety. 08/12/15  Yes Shuvon B Rankin, NP  isosorbide dinitrate (ISORDIL) 5 MG tablet Take 1 tablet (5 mg total) by mouth 3 (three) times daily. For heart condition 08/31/14  Yes Sanjuana Kava, NP  lisinopril (PRINIVIL,ZESTRIL) 2.5 MG tablet Take 1 tablet (2.5 mg total) by mouth daily. For high blood pressure 08/31/14  Yes Sanjuana Kava, NP  metoprolol succinate (TOPROL-XL) 25 MG 24 hr tablet Take 1 tablet (25 mg total) by  mouth daily. For high blood pressure 08/31/14  Yes Sanjuana Kava, NP  Multiple Vitamin (MULTI-VITAMINS) TABS Take 1 tablet by mouth daily. For low vitamin 08/31/14  Yes Sanjuana Kava, NP  nicotine (RA NICOTINE) 21 mg/24hr patch Place 1 patch (21 mg total) onto the skin daily. For nicotine addiction 08/31/14  Yes Sanjuana Kava, NP  potassium chloride (K-DUR,KLOR-CON) 10 MEQ tablet Take 1 tablet (10 mEq total) by mouth daily. For low potassium 08/31/14  Yes Sanjuana Kava, NP  raltegravir (ISENTRESS) 400 MG tablet Take 1 tablet (400 mg total) by mouth 2 (two) times daily. For HIV infection 08/31/14  Yes Sanjuana Kava, NP  spironolactone (ALDACTONE) 25 MG tablet Take 0.5 tablets (12.5 mg total) by mouth daily. For heart condition 08/31/14  Yes Sanjuana Kava, NP  traZODone (DESYREL) 150 MG tablet Take 1 tablet (150 mg total) by mouth at bedtime. 08/12/15  Yes Shuvon B Rankin, NP  valACYclovir (VALTREX) 500 MG tablet Take 1 tablet (500 mg total) by mouth 2 (two) times daily. For Herpes. 08/31/14  Yes Sanjuana Kava, NP   Triage Vitals: BP 129/75 mmHg  Pulse 102  Temp(Src) 97.9 F (36.6 C) (Oral)  Resp 16  Ht 5\' 9"  (1.753 m)  Wt 193 lb 3 oz (87.629 kg)  BMI 28.52 kg/m2  SpO2 98%   Physical Exam  Constitutional: He is oriented to person, place, and time. He appears well-developed and well-nourished. No distress.  HENT:  Head: Normocephalic and atraumatic.  Eyes: Conjunctivae and EOM are normal. Pupils are equal, round, and reactive to light.  Neck: Normal range of motion. Neck supple. No JVD present.  Cardiovascular: Normal rate, regular rhythm and normal heart sounds.  Exam reveals no gallop and no friction rub.   No murmur heard. Pulmonary/Chest: Effort normal and breath sounds normal. He has no wheezes. He has no rales. He exhibits no tenderness.  Abdominal: Soft. Bowel sounds are normal. He exhibits no mass. There is no tenderness.  Musculoskeletal: Normal range of motion. He exhibits no edema or  tenderness.  Lymphadenopathy:    He has no cervical adenopathy.  Neurological: He is alert and oriented to person, place, and time. No cranial nerve deficit. He exhibits normal muscle tone. Coordination normal.  Skin: Skin is warm and dry. No rash noted.  Psychiatric: He has a normal mood and affect. His behavior is normal. Judgment and thought content normal.  Nursing note and vitals reviewed.   ED Course  Procedures (including critical care time)  DIAGNOSTIC STUDIES: Oxygen Saturation is 98% on RA, normal by my interpretation.    COORDINATION OF CARE: 12:52 AM-Discussed treatment plan which includes D-dimer, BMP, CBC, Troponin with pt at bedside and pt agreed to plan.   Labs Review Results for orders placed or performed during  the hospital encounter of 08/13/15  Basic metabolic panel  Result Value Ref Range   Sodium 138 135 - 145 mmol/L   Potassium 4.1 3.5 - 5.1 mmol/L   Chloride 105 101 - 111 mmol/L   CO2 25 22 - 32 mmol/L   Glucose, Bld 98 65 - 99 mg/dL   BUN 18 6 - 20 mg/dL   Creatinine, Ser 1.61 0.61 - 1.24 mg/dL   Calcium 8.9 8.9 - 09.6 mg/dL   GFR calc non Af Amer >60 >60 mL/min   GFR calc Af Amer >60 >60 mL/min   Anion gap 8 5 - 15  CBC  Result Value Ref Range   WBC 3.2 (L) 4.0 - 10.5 K/uL   RBC 5.00 4.22 - 5.81 MIL/uL   Hemoglobin 17.4 (H) 13.0 - 17.0 g/dL   HCT 04.5 40.9 - 81.1 %   MCV 94.6 78.0 - 100.0 fL   MCH 34.8 (H) 26.0 - 34.0 pg   MCHC 36.8 (H) 30.0 - 36.0 g/dL   RDW 91.4 78.2 - 95.6 %   Platelets 154 150 - 400 K/uL  D-dimer, quantitative  Result Value Ref Range   D-Dimer, Quant 0.46 0.00 - 0.48 ug/mL-FEU  I-stat troponin, ED  Result Value Ref Range   Troponin i, poc 0.00 0.00 - 0.08 ng/mL   Comment 3           Imaging Review Dg Chest 2 View  08/13/2015   CLINICAL DATA:  Chest pain onset today.  EXAM: CHEST  2 VIEW  COMPARISON:  12/19/2013  FINDINGS: The heart size and mediastinal contours are within normal limits. Both lungs are clear. The  visualized skeletal structures are unremarkable.  IMPRESSION: No active cardiopulmonary disease.   Electronically Signed   By: Burman Nieves M.D.   On: 08/13/2015 01:06   I have personally reviewed and evaluated these images and lab results as part of my medical decision-making.   EKG Interpretation   Date/Time:  Friday August 13 2015 00:14:46 EDT Ventricular Rate:  103 PR Interval:  194 QRS Duration: 84 QT Interval:  336 QTC Calculation: 440 R Axis:   70 Text Interpretation:  Sinus tachycardia Anteroseptal infarct , age  undetermined T wave abnormality, consider inferolateral ischemia Abnormal  ECG When compared with ECG of 08/08/2015, No significant change was found  Confirmed by [facility]  MD, Burke Terry ([id number]) on 08/13/2015 12:34:26 AM      MDM   Final diagnoses:  Atypical chest pain  Leukopenia    Chest pain which seems quite atypical. Doubt serious pathology. Patient was stressed to having just been discharged from behavioral health and waiting for a bus. ECG is unchanged from baseline. Troponin has come back normal, as has d-dimer. Chest x-ray is unremarkable. Patient is reassured of benign nature of his chest discomfort and is discharged with instructions to follow-up with PCP.   I personally performed the services described in this documentation, which was scribed in my presence. The recorded information has been reviewed and is accurate.      Ryan Booze, MD 08/13/15 [phone]

## 2015-08-13 NOTE — ED Notes (Signed)
Pt c/o central, non radiating chest pain onset three hours pta. Also reports wheezine in which pt has been using inhaler. Mild wheezing noted.

## 2015-08-13 NOTE — ED Notes (Signed)
Pt left with all belongings and refused wheelchair. Discharge instructions were reviewed and all questions were answered.  

## 2015-08-13 NOTE — Discharge Instructions (Signed)

## 2015-08-13 NOTE — ED Notes (Signed)
Pt. reports central chest pain with SOB and occasional productive cough onset today , denies nausea or diaphoresis .

## 2015-08-16 ENCOUNTER — Ambulatory Visit (INDEPENDENT_AMBULATORY_CARE_PROVIDER_SITE_OTHER): Payer: Medicaid Other | Admitting: Infectious Disease

## 2015-08-16 ENCOUNTER — Encounter: Payer: Self-pay | Admitting: Infectious Disease

## 2015-08-16 VITALS — BP 99/67 | HR 112 | Temp 98.1°F | Wt 195.0 lb

## 2015-08-16 DIAGNOSIS — R45851 Suicidal ideations: Secondary | ICD-10-CM

## 2015-08-16 DIAGNOSIS — Z23 Encounter for immunization: Secondary | ICD-10-CM

## 2015-08-16 DIAGNOSIS — F1920 Other psychoactive substance dependence, uncomplicated: Secondary | ICD-10-CM | POA: Diagnosis not present

## 2015-08-16 DIAGNOSIS — F4310 Post-traumatic stress disorder, unspecified: Secondary | ICD-10-CM | POA: Diagnosis not present

## 2015-08-16 DIAGNOSIS — F333 Major depressive disorder, recurrent, severe with psychotic symptoms: Secondary | ICD-10-CM

## 2015-08-16 DIAGNOSIS — F1020 Alcohol dependence, uncomplicated: Secondary | ICD-10-CM | POA: Diagnosis not present

## 2015-08-16 DIAGNOSIS — B20 Human immunodeficiency virus [HIV] disease: Secondary | ICD-10-CM

## 2015-08-16 DIAGNOSIS — F1420 Cocaine dependence, uncomplicated: Secondary | ICD-10-CM

## 2015-08-16 MED ORDER — VALACYCLOVIR HCL 500 MG PO TABS: 500.0000 mg | ORAL_TABLET | Freq: Every day | ORAL | Status: DC

## 2015-08-16 MED ORDER — EMTRICITABINE-TENOFOVIR AF 200-25 MG PO TABS: 1.0000 | ORAL_TABLET | Freq: Every day | ORAL | Status: DC

## 2015-08-16 MED ORDER — BUPROPION HCL ER (XL) 150 MG PO TB24: 150.0000 mg | ORAL_TABLET | Freq: Every day | ORAL | Status: DC

## 2015-08-16 MED ORDER — TRAZODONE HCL 150 MG PO TABS: 150.0000 mg | ORAL_TABLET | Freq: Every day | ORAL | Status: DC

## 2015-08-16 MED ORDER — DOLUTEGRAVIR SODIUM 50 MG PO TABS: 50.0000 mg | ORAL_TABLET | Freq: Every day | ORAL | Status: DC

## 2015-08-16 MED ORDER — HYDROXYZINE HCL 25 MG PO TABS: 25.0000 mg | ORAL_TABLET | Freq: Every evening | ORAL | Status: DC | PRN

## 2015-08-16 NOTE — Progress Notes (Signed)
Subjective:    Patient ID: Ryan Long, male    DOB: [birth year], 54 y.o.   MRN: [id number]  HPI  54 year old man with HIV and host of other medical problems including depression with suicidal ideation, alcohol abuse, COPD, heart failure, who has had care for his HIV at various places including here in [place] at [facility] and [address], [employer], [place]. He has not been seen here since 2015 but was recently admitted to [facility] and asked to be followed in [facility] again.   He came to clinic today and stated that he had NOT like [facility] where he had initially been instructed to go because "there are all of these young and crazy people there" When I visited with the pt I initially offered to rx his psychiatric meds for now and have him meet with our substance abuse counselor Lennox Laity and Bernette Redbird our counselor and he agreed.   During the visit he repeatedly came into our pod and initally I took to be typical behavior but it persisted and became disruptive. After I had finished seeing pt and after he has seen by Bernette Redbird and Ulyses Southward he apparently became agitated about the fact that AFTER he had left the clinic that his water bottle that he had left behind had been thrown away and proceeded to swear at staff who had actually purchased a new water bottle for him.    Review of Systems  Constitutional: Negative for fever, chills, diaphoresis, activity change, appetite change, fatigue and unexpected weight change.  HENT: Negative for congestion, sneezing, sore throat and trouble swallowing.   Respiratory: Negative for cough, shortness of breath and wheezing.   Cardiovascular: Negative for chest pain.  Gastrointestinal: Negative for vomiting, diarrhea, constipation and abdominal distention.  Genitourinary: Negative for dysuria and difficulty urinating.  Musculoskeletal: Negative for back pain.  Skin: Negative for rash and wound.  Hematological: Negative for adenopathy.  Psychiatric/Behavioral: Positive for suicidal  ideas, behavioral problems, sleep disturbance and dysphoric mood. Negative for confusion, decreased concentration and agitation. The patient is nervous/anxious.        Objective:   Physical Exam  Constitutional: He is oriented to person, place, and time. He appears well-developed and well-nourished.  HENT:  Head: Normocephalic and atraumatic.  Eyes: Conjunctivae and EOM are normal.  Neck: Normal range of motion. Neck supple.  Cardiovascular: Normal rate and regular rhythm.   Pulmonary/Chest: Effort normal. No respiratory distress. He has no wheezes.  Abdominal: Soft. He exhibits no distension.  Musculoskeletal: Normal range of motion. He exhibits no edema or tenderness.  Neurological: He is alert and oriented to person, place, and time.  Skin: Skin is warm and dry. No rash noted. No erythema. No pallor.  Psychiatric: His mood appears anxious. He is agitated. Cognition and memory are impaired.          Assessment & Plan:   HIV: has been well controlled and I wrote for [facility] and Descovy for him and planned for him to come back in 2 months for repeat labs  BIpolar disorder with substance abuse and recent suicidal ideation:  I initially wrote for his current psychiatric meds but then he proceeded to behave in a very inappropriate manner towards staff (when I was with another patient) and was verbally abusvie towards them despite all of the work that was done on his behalf.  We can CONSISER providing continued care here BUT ONLY UNDER THE CONDITION THAT HE NEVER BEHAVE AS HE DID TODAY.  STAFF WILL  NEED WARNING WHEN HE COMES TO CLINIC NOT JUST TO SEE ID PROVIDER BUT ALSO KENNY , JODI OR OTHER STAFF  I WOULD HAVE VERY LOW THRESH HOLD TO DISMISS HIM FROM OUR CLINIC AND HE HAS MX OTHER CLINICS WHERE HE HAS BEEN SEEN INCLUDING [facility] AND [facility]  HE HAS NOW ONE YEAR PSYCH MEDS FROM ME BUT HE WILL NOT HAVE FURTHER TITRATION OF THESE MEDS OR NEW MEDS STARTED BY ME  Etohism and cocaine abuse:  see discussion above. His behavior to day was COMPLETELY UNACCEPTABLE. UNFORTUNATELY IT ALL OCCURRED IN SETTING THAT I WAS UNAWARE OF SINCE I WAS WITH ANOTHER PT AND HE HAD LEFT THE CLINIC BEFORE I WAS MADE AWARE OF HIS BEHAVIOR Will discuss with my clinic manager dismissal now vs dismissal with FURTHER such behavior  I spent greater than 40 minutes with the patient including greater than 50% of time in face to face counsel of the patient re his HIV, substance abuse and his bipolar disorder with recent suicidal ideation and in coordination of their care.

## 2015-08-16 NOTE — Progress Notes (Signed)
Patient ID: Ryan Long, male   DOB: 1961-06-10, 54 y.o.   MRN: [id number] HPI: Ryan Long is a 54 y.o. male who is transferring his care back to [facility].   Allergies: No Known Allergies  Vitals: Temp: 98.1 F (36.7 C) (08/29 1534) Temp Source: Oral (08/29 1534) BP: 99/67 mmHg (08/29 1534) Pulse Rate: 112 (08/29 1534)  Past Medical History: Past Medical History  Diagnosis Date  . HIV (human immunodeficiency virus infection)   . Chronic systolic heart failure   . Hypertension   . Genital herpes   . Active smoker   . AR (allergic rhinitis)   . HLD (hyperlipidemia)   . NICM (nonischemic cardiomyopathy)     Echocardiogram 06/28/11: EF 30-35%, mild MR, mild LAE;  No CAD by coronary CT angiogram 3/12 at [facility]  . NSVT (nonsustained ventricular tachycardia)   . Depression   . Bipolar 1 disorder   . Alcohol abuse   . Crack cocaine use   . COPD (chronic obstructive pulmonary disease)   . CHF (congestive heart failure)   . Anxiety   . PTSD (post-traumatic stress disorder)     Social History: Social History   Social History  . Marital Status: Single    Spouse Name: N/A  . Number of Children: N/A  . Years of Education: N/A   Social History Main Topics  . Smoking status: Current Every Day Smoker -- 0.00 packs/day for 30 years    Types: Cigarettes  . Smokeless tobacco: Never Used     Comment: 15 cigarettes daily  . Alcohol Use: Yes  . Drug Use: Yes    Special: Cocaine     Comment: reports no recent use but hx of THC and cocaine  . Sexual Activity: Not Asked     Comment: pt. given condoms   Other Topics Concern  . None   Social History Narrative    Previous Regimen:   Current Regimen: RAL/TRV  Labs: HIV 1 RNA QUANT (copies/mL)  Date Value  06/01/2015 <20  12/02/2013 <20  03/25/2012 <20   CD4 T CELL ABS  Date Value  06/01/2015 400 /uL  12/02/2013 440 /uL  03/25/2012 470 cmm   HEP B S AB (no units)  Date Value  02/08/2009 INDETER*    HEPATITIS B SURFACE AG (no units)  Date Value  02/08/2009 NEG   HCV AB (no units)  Date Value  02/08/2009 NEG    CrCl: Estimated Creatinine Clearance: 78.1 mL/min (by C-G formula based on Cr of 1.19).  Lipids:    Component Value Date/Time   CHOL 174 08/08/2015 0641   TRIG 183* 08/08/2015 0641   HDL 45 08/08/2015 0641   CHOLHDL 3.9 08/08/2015 0641   VLDL 37 08/08/2015 0641   LDLCALC 92 08/08/2015 0641    Assessment: Ryan Long is coming back to [facility] for care. He is doing well on his current regimen. Dr. Daiva Eves simplified further today to DTG/Descovy. He has medicaid. He is on a few meds due to his CHF. We decided to make him a med calendar so he can follow it easier. He was appreciative of it.   Recommendations: Stop RAL/TRV Start DTG 50mg  PO qday Start Descovy 1 PO qday  Clide Cliff, PharmD Clinical Infectious Disease Pharmacist [facility] 08/16/2015, 5:17 PM

## 2015-08-16 NOTE — Patient Instructions (Signed)
Your new HIV regimen is  Tivicay 50mg  one yellow tablet ONCE daily  And  DESCOVY one small blue pill once daily  These will take the place of isentress and truvada  I want you to meet with Scherry Ran and Bernette Redbird here and to come and see me in 2 months

## 2015-08-18 ENCOUNTER — Encounter: Payer: Self-pay | Admitting: Family Medicine

## 2015-08-18 ENCOUNTER — Ambulatory Visit: Payer: Medicaid Other | Attending: Family Medicine | Admitting: Family Medicine

## 2015-08-18 VITALS — BP 120/73 | HR 100 | Temp 98.1°F | Ht 69.0 in | Wt 196.0 lb

## 2015-08-18 DIAGNOSIS — B20 Human immunodeficiency virus [HIV] disease: Secondary | ICD-10-CM | POA: Diagnosis not present

## 2015-08-18 DIAGNOSIS — J449 Chronic obstructive pulmonary disease, unspecified: Secondary | ICD-10-CM | POA: Insufficient documentation

## 2015-08-18 DIAGNOSIS — E785 Hyperlipidemia, unspecified: Secondary | ICD-10-CM | POA: Insufficient documentation

## 2015-08-18 DIAGNOSIS — I5032 Chronic diastolic (congestive) heart failure: Secondary | ICD-10-CM | POA: Insufficient documentation

## 2015-08-18 DIAGNOSIS — I10 Essential (primary) hypertension: Secondary | ICD-10-CM | POA: Diagnosis not present

## 2015-08-18 DIAGNOSIS — F333 Major depressive disorder, recurrent, severe with psychotic symptoms: Secondary | ICD-10-CM | POA: Diagnosis not present

## 2015-08-18 DIAGNOSIS — Z7982 Long term (current) use of aspirin: Secondary | ICD-10-CM | POA: Insufficient documentation

## 2015-08-18 DIAGNOSIS — F329 Major depressive disorder, single episode, unspecified: Secondary | ICD-10-CM | POA: Insufficient documentation

## 2015-08-18 DIAGNOSIS — F17210 Nicotine dependence, cigarettes, uncomplicated: Secondary | ICD-10-CM | POA: Insufficient documentation

## 2015-08-18 DIAGNOSIS — Z79899 Other long term (current) drug therapy: Secondary | ICD-10-CM | POA: Insufficient documentation

## 2015-08-18 MED ORDER — FUROSEMIDE 40 MG PO TABS: 40.0000 mg | ORAL_TABLET | Freq: Every day | ORAL | Status: DC

## 2015-08-18 MED ORDER — LISINOPRIL 2.5 MG PO TABS: 2.5000 mg | ORAL_TABLET | Freq: Every day | ORAL | Status: DC

## 2015-08-18 MED ORDER — SPIRONOLACTONE 25 MG PO TABS: 12.5000 mg | ORAL_TABLET | Freq: Every day | ORAL | Status: DC

## 2015-08-18 MED ORDER — ISOSORBIDE DINITRATE 5 MG PO TABS: 5.0000 mg | ORAL_TABLET | Freq: Three times a day (TID) | ORAL | Status: DC

## 2015-08-18 MED ORDER — POTASSIUM CHLORIDE CRYS ER 10 MEQ PO TBCR: 10.0000 meq | EXTENDED_RELEASE_TABLET | Freq: Every day | ORAL | Status: DC

## 2015-08-18 NOTE — Patient Instructions (Signed)
Cardiomyopathy  Cardiomyopathy means a disease of the heart muscle. The heart muscle becomes enlarged or stiff. The heart is not able to pump enough blood or deliver enough oxygen to the body. This leads to heart failure and is the number one reason for heart transplants.   TYPES OF CARDIOMYOPATHY INCLUDE:  DILATED   The most common type. The heart muscle is stretched out and weak so there is less blood pumped out.   · Some causes:  ¨ Disease of the arteries of the heart (ischemia).  ¨ Heart attack with muscle scar.  ¨ Leaky or damaged valves.  ¨ After a viral illness.  ¨ Smoking.  ¨ High cholesterol.  ¨ Diabetes or overactive thyroid.  ¨ Alcohol or drug abuse.  ¨ High blood pressure.  · May be reversible.  HYPERTROPHIC  The heart muscle grows bigger so there is less room for blood in the ventricle, and not enough blood is pumped out.   · Causes include:  ¨ Mitral valve leaks.  ¨ Inherited tendency (from your family).  ¨ No explanation (idiopathic).  ¨ May be a cause of sudden death in young athletes with no symptoms.  RESTRICTIVE  The heart muscle becomes stiff, but not always larger. The heart has to work harder and will get weaker. Abnormal heart beats or rhythm (arrhythmia) are common.  · Some causes:  ¨ Diseases in other parts of the body which may produce abnormal deposits in the heart muscle.  ¨ Probably not inherited.  ¨ A result of radiation treatment for cancer.  SYMPTOMS OF ALL TYPES:  · Less able to exercise or tolerate physical activity.  · Palpitations.  · Irregular heart beat, heart arrhythmias.  · Shortness of breath, even at rest.  · Chest pain.  · Lightheadedness or fainting.  TREATMENT  · Life-style changes including reducing salt, lowering cholesterol, stop smoking.  · Manage contributing causes with medications.  · Medicines to help reduce the fluids in the body.  · An implanted cardioverter defibrillator (ICD) to improve heart function and correct arrhythmias.  · Medications to relax the blood  vessels and make it easier for the heart to pump.  · Drugs that help regulate heart beat and improve heart relaxation, reducing the work of the heart.  · Myomectomy for patients with hypertrophic cardiomyopathy and severe problems. This is a surgical procedure that removes a portion of the thickened muscle wall in order to improve heart output and provide symptom relief.  · A heart transplant is an option in carefully applied circumstances.  SEEK IMMEDIATE MEDICAL CARE IF:   · You have severe chest pain, especially if the pain is crushing or pressure-like and spreads to the arms, back, neck, or jaw, or if you have sweating, feeling sick to your stomach (nausea), or shortness of breath. THIS IS AN EMERGENCY. Do not wait to see if the pain will go away. Get medical help at once. Call your local emergency services (911 in [place] DO NOT drive yourself to the hospital.  · You develop severe shortness of breath.  · You begin to cough up bloody sputum.  · You are unable to sleep because you cannot breathe.  · You gain weight due to fluid retention.  · You develop painful swelling in your calf or leg.  · You feel your heart racing and it does not go away or happens when you are resting.  Document Released: 02/16/2005 Document Revised: 02/26/2012 Document Reviewed: 07/22/2008  [employer]® Patient Information ©2015   [employer]. This information is not intended to replace advice given to you by your health care provider. Make sure you discuss any questions you have with your health care provider.

## 2015-08-18 NOTE — Progress Notes (Signed)
Ryan Long, is a 54 y.o. male  ZOX:[id number]  [id number]:[id number]  DOB - 1961/08/21  CC:  Chief Complaint  Patient presents with  . Establish Care       HPI: Ryan Long is a 54 y.o. male with a history of HIV, COPD, CHF (EF 35-40%),depression, hypertension who comes into the clinic to establish care. He was recently seen at the [facility] ED on 08/13/15 after he had presented with chest pains shortness of breath. His serial troponins were negative, EKG revealed sinus tachycardia, T-wave changes and questionable anterolateral ischemia chest x-ray was unremarkable his chest pain was thought to be atypical. He was subsequently discharged. Prior to this presentation he was admitted for substance abuse and worsening depression from 08/06/15-08/12/15 at [facility] but is yet to be established with an outpatient psychiatric facility even though he was supposed to follow-up at [facility]..  He had a visit to infectious disease 2 days ago where his HIV was managed. [facility] for management of his CHF; he endorses compliance with medications.  Patient has No headache, No chest pain, No abdominal pain - No Nausea, No new weakness tingling or numbness, No Cough - SOB. States he feels good today.    No Known Allergies Past Medical History  Diagnosis Date  . HIV (human immunodeficiency virus infection)   . Chronic systolic heart failure   . Hypertension   . Genital herpes   . Active smoker   . AR (allergic rhinitis)   . HLD (hyperlipidemia)   . NICM (nonischemic cardiomyopathy)     Echocardiogram 06/28/11: EF 30-35%, mild MR, mild LAE;  No CAD by coronary CT angiogram 3/12 at [facility]  . NSVT (nonsustained ventricular tachycardia)   . Depression   . Bipolar 1 disorder   . Alcohol abuse   . Crack cocaine use   . COPD (chronic obstructive pulmonary disease)   . CHF (congestive heart failure)   . Anxiety   . PTSD (post-traumatic  stress disorder)    Current Outpatient Prescriptions on File Prior to Visit  Medication Sig Dispense Refill  . acetaminophen (TYLENOL) 500 MG tablet Take 1,000 mg by mouth every 6 (six) hours as needed for mild pain.    Marland Kitchen albuterol (PROVENTIL HFA;VENTOLIN HFA) 108 (90 BASE) MCG/ACT inhaler Inhale 2 puffs into the lungs every 4 (four) hours as needed for shortness of breath. 1 Inhaler 0  . aspirin 81 MG chewable tablet Chew 1 tablet (81 mg total) by mouth every morning. For heart health    . buPROPion (WELLBUTRIN XL) 150 MG 24 hr tablet Take 1 tablet (150 mg total) by mouth daily. 30 tablet 11  . digoxin (LANOXIN) 0.125 MG tablet Take 1 tablet (125 mcg total) by mouth daily. For control of heart arrythmia.    Marland Kitchen dolutegravir (TIVICAY) 50 MG tablet Take 1 tablet (50 mg total) by mouth daily. 30 tablet 11  . emtricitabine-tenofovir AF (DESCOVY) 200-25 MG per tablet Take 1 tablet by mouth daily. 30 tablet 11  . hydrOXYzine (ATARAX/VISTARIL) 25 MG tablet Take 1 tablet (25 mg total) by mouth at bedtime as needed for anxiety. 30 tablet 11  . metoprolol succinate (TOPROL-XL) 25 MG 24 hr tablet Take 1 tablet (25 mg total) by mouth daily. For high blood pressure    . Multiple Vitamin (MULTI-VITAMINS) TABS Take 1 tablet by mouth daily. For low vitamin 30 tablet   . nicotine (RA NICOTINE) 21 mg/24hr patch Place 1  patch (21 mg total) onto the skin daily. For nicotine addiction 28 patch 0  . traZODone (DESYREL) 150 MG tablet Take 1 tablet (150 mg total) by mouth at bedtime. 30 tablet 11  . valACYclovir (VALTREX) 500 MG tablet Take 1 tablet (500 mg total) by mouth daily. For Herpes. 30 tablet 11   No current facility-administered medications on file prior to visit.   Family History  Problem Relation Age of Onset  . Hypertension Father   . CAD Father    Social History   Social History  . Marital Status: Single    Spouse Name: N/A  . Number of Children: N/A  . Years of Education: N/A   Occupational  History  . Not on file.   Social History Main Topics  . Smoking status: Current Every Day Smoker -- 0.00 packs/day for 30 years    Types: Cigarettes  . Smokeless tobacco: Never Used     Comment: 15 cigarettes daily  . Alcohol Use: Yes  . Drug Use: Yes    Special: Cocaine     Comment: reports no recent use but hx of THC and cocaine  . Sexual Activity: Not on file     Comment: pt. given condoms   Other Topics Concern  . Not on file   Social History Narrative    Review of Systems: Constitutional: Negative for fever, chills, diaphoresis, activity change, appetite change and fatigue. HENT: Negative for ear pain, nosebleeds, congestion, facial swelling, rhinorrhea, neck pain, neck stiffness and ear discharge.  Eyes: Negative for pain, discharge, redness, itching and visual disturbance. Respiratory: Negative for cough, choking, chest tightness, shortness of breath, wheezing and stridor.  Cardiovascular: Negative for chest pain, palpitations and leg swelling. Gastrointestinal: Negative for abdominal distention. Genitourinary: Negative for dysuria, urgency, frequency, hematuria, flank pain, decreased urine volume, difficulty urinating and dyspareunia.  Musculoskeletal: Negative for back pain, joint swelling, arthralgia and gait problem. Neurological: Negative for dizziness, tremors, seizures, syncope, facial asymmetry, speech difficulty, weakness, light-headedness, numbness and headaches.  Hematological: Negative for adenopathy. Does not bruise/bleed easily. Skin: Negative for rash, ulcer. Psychiatric/Behavioral: Negative for hallucinations, behavioral problems, confusion, dysphoric mood, decreased concentration and agitation.    Objective:   Filed Vitals:   08/18/15 1108  BP: 120/73  Pulse: 100  Temp: 98.1 F (36.7 C)    Physical Exam: Constitutional: Patient appears well-developed and well-nourished. No distress. HENT: Normocephalic, atraumatic, External right and left ear  normal. Oropharynx is clear and moist.  Eyes: Conjunctivae and EOM are normal. PERRLA, no scleral icterus. Neck: Normal ROM, No JVD. No tracheal deviation. No thyromegaly. CVS: RRR, S1/S2 +, no murmurs, no gallops, no carotid bruit.  Pulmonary: Effort and breath sounds normal, no stridor, rhonchi, wheezes, rales.  Abdominal: Soft. BS +, no distension, tenderness, rebound or guarding.  Musculoskeletal: Normal range of motion. No edema and no tenderness.  Lymphadenopathy: No lymphadenopathy noted, cervical, inguinal or axillary Neuro: Alert. Normal reflexes, muscle tone coordination. No cranial nerve deficit. Skin: Skin is warm and dry. No rash noted. Not diaphoretic. No erythema. No pallor. Psychiatric: Normal mood and affect. Behavior, judgment, thought content normal.  Lab Results  Component Value Date   WBC 3.2* 08/13/2015   HGB 17.4* 08/13/2015   HCT 47.3 08/13/2015   MCV 94.6 08/13/2015   PLT 154 08/13/2015   Lab Results  Component Value Date   CREATININE 1.19 08/13/2015   BUN 18 08/13/2015   NA 138 08/13/2015   K 4.1 08/13/2015   CL 105 08/13/2015  CO2 25 08/13/2015    Lab Results  Component Value Date   HGBA1C 5.3 08/08/2015   Lipid Panel     Component Value Date/Time   CHOL 174 08/08/2015 0641   TRIG 183* 08/08/2015 0641   HDL 45 08/08/2015 0641   CHOLHDL 3.9 08/08/2015 0641   VLDL 37 08/08/2015 0641   LDLCALC 92 08/08/2015 0641       Assessment and plan:  CHF: EF of 35-40% from 2-D echo of 11/2013 with diffuse hypokinesis and grade 1 diastolic dysfunction. Continue Lasix. I have refilled his other medications except digoxin I will defer to cardiology to determine his need for this. He is actively using cocaine and so I am not refilling his beta blocker; he has been counseled on cessation of cocaine.  [facility].  Hypertension: Controlled.   HIV: CD4 count of 400 Continue antiretrovirals therapy. Keep  appointment with infectious disease  COPD: Stable.   Major depression: Behavioral health to see the patient and provide resources for psychiatry services.   The patient was given clear instructions to go to ER or return to medical center if symptoms don't improve, worsen or new problems develop. The patient verbalized understanding. The patient was told to call to get lab results if they haven't heard anything in the next week.     Jaclyn Shaggy, MD. [facility] [phone] 08/18/2015, 3:08 PM

## 2015-08-18 NOTE — Progress Notes (Signed)
Establish care Positive for tobacco and ETOH and drugs Needs refills on his digoxin, furosemide, isosorbide,lisinopril, metoprolol, potassium,sprionolactone, multivitamin Reports no pain or depression/anxiety

## 2015-08-19 ENCOUNTER — Telehealth: Payer: Self-pay | Admitting: Clinical

## 2015-08-19 NOTE — Telephone Encounter (Signed)
Left HIPPA-compliant message to call Asher Muir from [employer] at [phone].

## 2015-08-24 ENCOUNTER — Inpatient Hospital Stay (HOSPITAL_COMMUNITY): Admission: RE | Admit: 2015-08-24 | Payer: Self-pay | Source: Ambulatory Visit

## 2015-09-09 ENCOUNTER — Encounter (HOSPITAL_COMMUNITY): Payer: Self-pay

## 2015-09-09 ENCOUNTER — Ambulatory Visit (HOSPITAL_COMMUNITY)
Admission: RE | Admit: 2015-09-09 | Discharge: 2015-09-09 | Disposition: A | Discharge status: Home / Self Care | Payer: Medicaid Other | Source: Ambulatory Visit | Attending: Internal Medicine | Admitting: Internal Medicine

## 2015-09-09 VITALS — BP 116/70 | HR 97 | Wt 195.0 lb

## 2015-09-09 DIAGNOSIS — F419 Anxiety disorder, unspecified: Secondary | ICD-10-CM | POA: Insufficient documentation

## 2015-09-09 DIAGNOSIS — Z7982 Long term (current) use of aspirin: Secondary | ICD-10-CM | POA: Insufficient documentation

## 2015-09-09 DIAGNOSIS — I5022 Chronic systolic (congestive) heart failure: Secondary | ICD-10-CM | POA: Insufficient documentation

## 2015-09-09 DIAGNOSIS — I10 Essential (primary) hypertension: Secondary | ICD-10-CM | POA: Diagnosis not present

## 2015-09-09 DIAGNOSIS — Z79899 Other long term (current) drug therapy: Secondary | ICD-10-CM | POA: Diagnosis not present

## 2015-09-09 DIAGNOSIS — F17210 Nicotine dependence, cigarettes, uncomplicated: Secondary | ICD-10-CM | POA: Diagnosis not present

## 2015-09-09 DIAGNOSIS — F17200 Nicotine dependence, unspecified, uncomplicated: Secondary | ICD-10-CM

## 2015-09-09 DIAGNOSIS — B20 Human immunodeficiency virus [HIV] disease: Secondary | ICD-10-CM | POA: Insufficient documentation

## 2015-09-09 DIAGNOSIS — I159 Secondary hypertension, unspecified: Secondary | ICD-10-CM

## 2015-09-09 DIAGNOSIS — F4310 Post-traumatic stress disorder, unspecified: Secondary | ICD-10-CM | POA: Insufficient documentation

## 2015-09-09 DIAGNOSIS — Z720 Tobacco use: Secondary | ICD-10-CM

## 2015-09-09 DIAGNOSIS — F329 Major depressive disorder, single episode, unspecified: Secondary | ICD-10-CM | POA: Diagnosis not present

## 2015-09-09 DIAGNOSIS — J449 Chronic obstructive pulmonary disease, unspecified: Secondary | ICD-10-CM | POA: Diagnosis not present

## 2015-09-09 DIAGNOSIS — I428 Other cardiomyopathies: Secondary | ICD-10-CM | POA: Insufficient documentation

## 2015-09-09 DIAGNOSIS — E785 Hyperlipidemia, unspecified: Secondary | ICD-10-CM | POA: Diagnosis not present

## 2015-09-09 MED ORDER — ISOSORB DINITRATE-HYDRALAZINE 20-37.5 MG PO TABS: 1.0000 | ORAL_TABLET | Freq: Three times a day (TID) | ORAL | Status: DC

## 2015-09-09 MED ORDER — FUROSEMIDE 40 MG PO TABS: 40.0000 mg | ORAL_TABLET | ORAL | Status: DC | PRN

## 2015-09-09 MED ORDER — POTASSIUM CHLORIDE CRYS ER 10 MEQ PO TBCR: 10.0000 meq | EXTENDED_RELEASE_TABLET | ORAL | Status: DC | PRN

## 2015-09-09 NOTE — Progress Notes (Signed)
Patient ID: Ryan Long, male   DOB: [birth year], 54 y.o.   MRN: [id number] History of Present Illness: ID Doc: Ryan Ryan Long is a 54 y.o. male with a history of HIV, hypertension, major depressive disorder, chronic systolic heart failure, and polysubstance abuse.  Ryan Long in March of 2012.  Coronary CT angiogram demonstrated no CAD.  NICM was not felt to be related  to HIV given his good CD4 counts.    He has not been seen in the HF clininc in 4 years. Says he recently returns to [place]. Overall feeling ok. SOB with steps. +orthopnea sleeping on 2 pillows.Does not weigh at home because he does not have a scale. Able to walk a couple. Smoking 10-15 cigarettes per day. Denies alcohol / dryg abuse.  Lives alone.     ECHO 06/2013 EF 35-40% Past Medical History  Diagnosis Date  . HIV (human immunodeficiency virus infection)   . Chronic systolic heart failure   . Hypertension   . Genital herpes   . Active smoker   . AR (allergic rhinitis)   . HLD (hyperlipidemia)   . NICM (nonischemic cardiomyopathy)     Echocardiogram 06/28/11: EF 30-35%, mild MR, mild LAE;  No CAD by coronary CT angiogram 3/12 at [facility]  . NSVT (nonsustained ventricular tachycardia)   . Depression   . Bipolar 1 disorder   . Alcohol abuse   . Crack cocaine use   . COPD (chronic obstructive pulmonary disease)   . CHF (congestive heart failure)   . Anxiety   . PTSD (post-traumatic stress disorder)     Current Outpatient Prescriptions  Medication Sig Dispense Refill  . acetaminophen (TYLENOL) 500 MG tablet Take 1,000 mg by mouth every 6 (six) hours as needed for mild pain.    Marland Kitchen albuterol (PROVENTIL HFA;VENTOLIN HFA) 108 (90 BASE) MCG/ACT inhaler Inhale 2 puffs into the lungs every 4 (four) hours as needed for shortness of breath. 1 Inhaler 0  . aspirin 81 MG chewable tablet Chew 1 tablet (81 mg total) by mouth every morning. For heart health    . buPROPion (WELLBUTRIN  XL) 150 MG 24 hr tablet Take 1 tablet (150 mg total) by mouth daily. 30 tablet 11  . digoxin (LANOXIN) 0.125 MG tablet Take 1 tablet (125 mcg total) by mouth daily. For control of heart arrythmia.    Marland Kitchen dolutegravir (TIVICAY) 50 MG tablet Take 1 tablet (50 mg total) by mouth daily. 30 tablet 11  . emtricitabine-tenofovir AF (DESCOVY) 200-25 MG per tablet Take 1 tablet by mouth daily. 30 tablet 11  . furosemide (LASIX) 40 MG tablet Take 1 tablet (40 mg total) by mouth daily. For edema 30 tablet 0  . hydrOXYzine (ATARAX/VISTARIL) 25 MG tablet Take 1 tablet (25 mg total) by mouth at bedtime as needed for anxiety. 30 tablet 11  . isosorbide dinitrate (ISORDIL) 5 MG tablet Take 1 tablet (5 mg total) by mouth 3 (three) times daily. For heart condition 90 tablet 0  . lisinopril (PRINIVIL,ZESTRIL) 2.5 MG tablet Take 1 tablet (2.5 mg total) by mouth daily. For high blood pressure 30 tablet 0  . metoprolol succinate (TOPROL-XL) 25 MG 24 hr tablet Take 1 tablet (25 mg total) by mouth daily. For high blood pressure    . Multiple Vitamin (MULTI-VITAMINS) TABS Take 1 tablet by mouth daily. For low vitamin 30 tablet   . nicotine (RA NICOTINE) 21 mg/24hr patch Place 1 patch (21 mg total) onto the skin daily.  For nicotine addiction 28 patch 0  . potassium chloride (K-DUR,KLOR-CON) 10 MEQ tablet Take 1 tablet (10 mEq total) by mouth daily. For low potassium 30 tablet 0  . spironolactone (ALDACTONE) 25 MG tablet Take 0.5 tablets (12.5 mg total) by mouth daily. For heart condition 30 tablet 0  . traZODone (DESYREL) 150 MG tablet Take 1 tablet (150 mg total) by mouth at bedtime. 30 tablet 11  . valACYclovir (VALTREX) 500 MG tablet Take 1 tablet (500 mg total) by mouth daily. For Herpes. 30 tablet 11   No current facility-administered medications for this encounter.    Allergies: No Known Allergies   ROS:  Please see the history of present illness.  He denies vomiting or diarrhea.  He denies fevers or chills.  He  denies cough or sore throat.  He denies blood in his stools or black stools.  All other systems reviewed and negative.   Vital Signs: BP 116/70 mmHg  Pulse 97  Wt 195 lb (88.451 kg)  SpO2 99%  Filed Vitals:   09/09/15 1139  BP: 116/70  Pulse: 97  Weight: 195 lb (88.451 kg)  SpO2: 99%     PHYSICAL EXAM: Well nourished, well developed, in no acute distres. Ambulated in the clinic without difficulty.  HEENT: normal Neck: Minimally elevated JVPflat  Cardiac:  normal S1, S2; RRR; no murmur; No S3 Lungs:  clear to auscultation bilaterally, no rhonchi or rales, faint expiratory wheezing noted Abd: soft, nontender, no hepatomegaly Ext: no edema Skin: warm and dry Neuro:  CNs 2-12 intact, no focal abnormalities noted Psych: Normal affect  ASSESSMENT AND PLAN:   1. Chronic Systolic Heart Failure , NICM ,  Most recent ECHO 2014 EF 35-40%.  NYHA II-III. Volume status stable. Change lasix to as needed for 3 pound weight gain and only take potassium when you take lasix. Continue 12.5 mg spiro daily.  - Continue metoprolol XL 25 mg daily, lisinopril 2.5 mg daily. - Not hydralazine so I will stop isordil. Start [name] 1 tab three times a day.  - Plan to repeat ECHO and if EF down can start to optimize HF meds.  - Reinforced low salt diet and daily weight. Provided with a scale.  2. HIV- Followed by Ryan Long  3. HTN- Continue current regimen. Stop isordil and add bidil 1 tab three times daily  4. Currrent Smoker- 1/2 PPD Discussed smoking cessation however he declines.   Repeat ECHO. Follow up in 3 weeks to discuss results.   Ryan Long  1:07 PM

## 2015-09-09 NOTE — Progress Notes (Signed)
Advanced Heart Failure Medication Review by a Pharmacist  Does the patient  feel that his/her medications are working for him/her?  yes  Has the patient been experiencing any side effects to the medications prescribed?  no  Does the patient measure his/her own blood pressure or blood glucose at home?  no   Does the patient have any problems obtaining medications due to transportation or finances?   no  Understanding of regimen: good Understanding of indications: good Potential of compliance: good    Pharmacist comments:  [name] is a pleasant 54 yo M presenting with a current medication list and able to verbalize dosages to me. He reports excellent compliance with all of his medications and seems to understand the importance of continued use. He did not have any specific medication-related questions or concerns for me at this time.   Ryan Long. Ryan Long, PharmD, BCPS, CPP Clinical Pharmacist Pager: [phone] Phone: [phone] 09/09/2015 12:15 PM

## 2015-09-09 NOTE — Patient Instructions (Addendum)
You provider requests you have an echocardiogram next week.   TAKE lasix as needed for 3lbs weight gain.  TAKe potassium ONLY when you take lasix.  STOP Isordil.  START [name] 1 tablet three times a day.  FOLLOW UP: 3weeks n/p clinic

## 2015-09-10 ENCOUNTER — Emergency Department (HOSPITAL_COMMUNITY): Payer: Medicaid Other

## 2015-09-10 ENCOUNTER — Emergency Department (HOSPITAL_COMMUNITY)
Admission: EM | Admit: 2015-09-10 | Discharge: 2015-09-10 | Discharge status: Against Medical Advice | Payer: Medicaid Other | Attending: Emergency Medicine | Admitting: Emergency Medicine

## 2015-09-10 ENCOUNTER — Encounter (HOSPITAL_COMMUNITY): Payer: Self-pay | Admitting: *Deleted

## 2015-09-10 DIAGNOSIS — F319 Bipolar disorder, unspecified: Secondary | ICD-10-CM | POA: Diagnosis not present

## 2015-09-10 DIAGNOSIS — Z8619 Personal history of other infectious and parasitic diseases: Secondary | ICD-10-CM | POA: Diagnosis not present

## 2015-09-10 DIAGNOSIS — N179 Acute kidney failure, unspecified: Secondary | ICD-10-CM | POA: Diagnosis not present

## 2015-09-10 DIAGNOSIS — J441 Chronic obstructive pulmonary disease with (acute) exacerbation: Secondary | ICD-10-CM | POA: Insufficient documentation

## 2015-09-10 DIAGNOSIS — I5022 Chronic systolic (congestive) heart failure: Secondary | ICD-10-CM | POA: Diagnosis not present

## 2015-09-10 DIAGNOSIS — R55 Syncope and collapse: Secondary | ICD-10-CM

## 2015-09-10 DIAGNOSIS — Z7982 Long term (current) use of aspirin: Secondary | ICD-10-CM | POA: Insufficient documentation

## 2015-09-10 DIAGNOSIS — I951 Orthostatic hypotension: Secondary | ICD-10-CM

## 2015-09-10 DIAGNOSIS — R000 Tachycardia, unspecified: Secondary | ICD-10-CM | POA: Insufficient documentation

## 2015-09-10 DIAGNOSIS — I509 Heart failure, unspecified: Secondary | ICD-10-CM | POA: Diagnosis not present

## 2015-09-10 DIAGNOSIS — F419 Anxiety disorder, unspecified: Secondary | ICD-10-CM | POA: Diagnosis not present

## 2015-09-10 DIAGNOSIS — Z8639 Personal history of other endocrine, nutritional and metabolic disease: Secondary | ICD-10-CM | POA: Diagnosis not present

## 2015-09-10 DIAGNOSIS — R05 Cough: Secondary | ICD-10-CM | POA: Diagnosis not present

## 2015-09-10 DIAGNOSIS — I10 Essential (primary) hypertension: Secondary | ICD-10-CM | POA: Insufficient documentation

## 2015-09-10 DIAGNOSIS — R079 Chest pain, unspecified: Secondary | ICD-10-CM | POA: Diagnosis present

## 2015-09-10 DIAGNOSIS — Z21 Asymptomatic human immunodeficiency virus [HIV] infection status: Secondary | ICD-10-CM | POA: Insufficient documentation

## 2015-09-10 DIAGNOSIS — Z79899 Other long term (current) drug therapy: Secondary | ICD-10-CM | POA: Diagnosis not present

## 2015-09-10 DIAGNOSIS — R634 Abnormal weight loss: Secondary | ICD-10-CM | POA: Insufficient documentation

## 2015-09-10 DIAGNOSIS — Z720 Tobacco use: Secondary | ICD-10-CM | POA: Diagnosis not present

## 2015-09-10 LAB — BASIC METABOLIC PANEL
Anion gap: 12 (ref 5–15)
BUN: 20 mg/dL (ref 6–20)
CALCIUM: 9.3 mg/dL (ref 8.9–10.3)
CO2: 23 mmol/L (ref 22–32)
CREATININE: 1.74 mg/dL — AB (ref 0.61–1.24)
Chloride: 106 mmol/L (ref 101–111)
GFR calc Af Amer: 50 mL/min — ABNORMAL LOW (ref >60)
GFR, EST NON AFRICAN AMERICAN: 43 mL/min — AB (ref >60)
GLUCOSE: 139 mg/dL — AB (ref 65–99)
Potassium: 4.6 mmol/L (ref 3.5–5.1)
SODIUM: 141 mmol/L (ref 135–145)

## 2015-09-10 LAB — URINALYSIS, ROUTINE W REFLEX MICROSCOPIC
BILIRUBIN URINE: NEGATIVE
Glucose, UA: NEGATIVE mg/dL
HGB URINE DIPSTICK: NEGATIVE
Ketones, ur: NEGATIVE mg/dL
Leukocytes, UA: NEGATIVE
Nitrite: NEGATIVE
PH: 6 (ref 5.0–8.0)
Protein, ur: NEGATIVE mg/dL
SPECIFIC GRAVITY, URINE: 1.017 (ref 1.005–1.030)
UROBILINOGEN UA: 1 mg/dL (ref 0.0–1.0)

## 2015-09-10 LAB — CBC
HCT: 47.7 % (ref 39.0–52.0)
Hemoglobin: 17.4 g/dL — ABNORMAL HIGH (ref 13.0–17.0)
MCH: 34.3 pg — ABNORMAL HIGH (ref 26.0–34.0)
MCHC: 36.5 g/dL — AB (ref 30.0–36.0)
MCV: 93.9 fL (ref 78.0–100.0)
PLATELETS: 173 10*3/uL (ref 150–400)
RBC: 5.08 MIL/uL (ref 4.22–5.81)
RDW: 12.6 % (ref 11.5–15.5)
WBC: 5.3 10*3/uL (ref 4.0–10.5)

## 2015-09-10 LAB — BRAIN NATRIURETIC PEPTIDE: B Natriuretic Peptide: 11.7 pg/mL (ref 0.0–100.0)

## 2015-09-10 LAB — I-STAT CG4 LACTIC ACID, ED
Lactic Acid, Venous: 1.89 mmol/L (ref 0.5–2.0)
Lactic Acid, Venous: 0.35 mmol/L — ABNORMAL LOW (ref 0.5–2.0)

## 2015-09-10 LAB — I-STAT TROPONIN, ED
TROPONIN I, POC: 0 ng/mL (ref 0.00–0.08)
TROPONIN I, POC: 0.01 ng/mL (ref 0.00–0.08)

## 2015-09-10 MED ORDER — SODIUM CHLORIDE 0.9 % IV BOLUS (SEPSIS)
500.0000 mL | Freq: Once | INTRAVENOUS | Status: AC
  Administered 2015-09-10: 500 mL via INTRAVENOUS

## 2015-09-10 MED ORDER — SODIUM CHLORIDE 0.9 % IV BOLUS (SEPSIS)
1000.0000 mL | INTRAVENOUS | Status: AC
  Administered 2015-09-10 (×2): 1000 mL via INTRAVENOUS

## 2015-09-10 MED ORDER — ASPIRIN 81 MG PO CHEW
324.0000 mg | CHEWABLE_TABLET | Freq: Once | ORAL | Status: AC
  Administered 2015-09-10: 324 mg via ORAL
  Filled 2015-09-10: qty 4

## 2015-09-10 NOTE — ED Notes (Signed)
ED resident at bedside.

## 2015-09-10 NOTE — ED Notes (Signed)
Pt was given a drink with EDP permission prior to pt reporting blurred vision and dizziness. Pt had no difficulty swallowing and has been alert and oriented and neurologically intact throughout entire ED visit.

## 2015-09-10 NOTE — ED Provider Notes (Signed)
CSN: [id number]     Arrival date & time 09/10/15  1440 History   None    Chief Complaint  Patient presents with  . Chest Pain     (Consider location/radiation/quality/duration/timing/severity/associated sxs/prior Treatment) Patient is a 54 y.o. male presenting with chest pain.  Chest Pain Pain location:  L chest Pain quality: aching and pressure   Pain radiates to the back: no   Pain severity:  Moderate Onset quality:  Sudden Duration:  5 minutes Progression:  Improving Chronicity:  New Context: at rest   Associated symptoms: cough, fatigue and shortness of breath   Associated symptoms: no fever and no palpitations     Ryan Long presents to the ED with left sided chest pressure that he experienced while riding a bus today and after 45 minutes of taking his new medication of [name] that was prescribed to him yesterday. Prior to his chest pain he endoresed having a heavy feeling in his arms, diaphoresis and SOB. He had near resolution of his chest pain after approximately 5 minutes and currently only endorses an 2/10 pain. He is well appearing.   Past Medical History  Diagnosis Date  . HIV (human immunodeficiency virus infection)   . Chronic systolic heart failure   . Hypertension   . Genital herpes   . Active smoker   . AR (allergic rhinitis)   . HLD (hyperlipidemia)   . NICM (nonischemic cardiomyopathy)     Echocardiogram 06/28/11: EF 30-35%, mild MR, mild LAE;  No CAD by coronary CT angiogram 3/12 at [name]  . NSVT (nonsustained ventricular tachycardia)   . Depression   . Bipolar 1 disorder   . Alcohol abuse   . Crack cocaine use   . COPD (chronic obstructive pulmonary disease)   . CHF (congestive heart failure)   . Anxiety   . PTSD (post-traumatic stress disorder)    History reviewed. No pertinent past surgical history. Family History  Problem Relation Age of Onset  . Hypertension Father   . CAD Father    Social History  Substance Use  Topics  . Smoking status: Current Every Day Smoker -- 0.00 packs/day for 30 years    Types: Cigarettes  . Smokeless tobacco: Never Used     Comment: 15 cigarettes daily  . Alcohol Use: Yes    Review of Systems  Constitutional: Positive for chills and fatigue. Negative for fever.  Respiratory: Positive for cough, shortness of breath and wheezing.   Cardiovascular: Positive for chest pain. Negative for palpitations and leg swelling.  Skin: Negative for rash.  Neurological: Positive for light-headedness.  Psychiatric/Behavioral: Negative for confusion.  All other systems reviewed and are negative.  Allergies  Review of patient's allergies indicates no known allergies.  Home Medications   Prior to Admission medications   Medication Sig Start Date End Date Taking? Authorizing Provider  acetaminophen (TYLENOL) 500 MG tablet Take 1,000 mg by mouth every 6 (six) hours as needed for mild pain.    Historical Provider, MD  albuterol (PROVENTIL HFA;VENTOLIN HFA) 108 (90 BASE) MCG/ACT inhaler Inhale 2 puffs into the lungs every 4 (four) hours as needed for shortness of breath. 08/12/15   Shuvon B Rankin, NP  aspirin 81 MG chewable tablet Chew 1 tablet (81 mg total) by mouth every morning. For heart health 08/31/14   Sanjuana Kava, NP  buPROPion (WELLBUTRIN XL) 150 MG 24 hr tablet Take 1 tablet (150 mg total) by mouth daily. 08/16/15   Randall Hiss, MD  digoxin (LANOXIN) 0.125 MG tablet Take 1 tablet (125 mcg total) by mouth daily. For control of heart arrythmia. 08/31/14   Sanjuana Kava, NP  dolutegravir (TIVICAY) 50 MG tablet Take 1 tablet (50 mg total) by mouth daily. 08/16/15   Randall Hiss, MD  emtricitabine-tenofovir AF (DESCOVY) 200-25 MG per tablet Take 1 tablet by mouth daily. 08/16/15   Randall Hiss, MD  furosemide (LASIX) 40 MG tablet Take 1 tablet (40 mg total) by mouth as needed. For 3lb weight gain 09/09/15   Amy D Filbert Schilder, NP  hydrOXYzine (ATARAX/VISTARIL) 25 MG tablet  Take 1 tablet (25 mg total) by mouth at bedtime as needed for anxiety. 08/16/15   Randall Hiss, MD  isosorbide-hydrALAZINE ([name]) 20-37.5 MG per tablet Take 1 tablet by mouth 3 (three) times daily. 09/09/15   Amy D Clegg, NP  lisinopril (PRINIVIL,ZESTRIL) 2.5 MG tablet Take 1 tablet (2.5 mg total) by mouth daily. For high blood pressure 08/18/15   Jaclyn Shaggy, MD  metoprolol succinate (TOPROL-XL) 25 MG 24 hr tablet Take 1 tablet (25 mg total) by mouth daily. For high blood pressure 08/31/14   Sanjuana Kava, NP  Multiple Vitamin (MULTI-VITAMINS) TABS Take 1 tablet by mouth daily. For low vitamin 08/31/14   Sanjuana Kava, NP  nicotine (RA NICOTINE) 21 mg/24hr patch Place 1 patch (21 mg total) onto the skin daily. For nicotine addiction 08/31/14   Sanjuana Kava, NP  potassium chloride (K-DUR,KLOR-CON) 10 MEQ tablet Take 1 tablet (10 mEq total) by mouth as needed (take only when you take lasix). For low potassium 09/09/15   Amy D Clegg, NP  spironolactone (ALDACTONE) 25 MG tablet Take 0.5 tablets (12.5 mg total) by mouth daily. For heart condition 08/18/15   Jaclyn Shaggy, MD  traZODone (DESYREL) 150 MG tablet Take 1 tablet (150 mg total) by mouth at bedtime. 08/16/15   Randall Hiss, MD  valACYclovir (VALTREX) 500 MG tablet Take 1 tablet (500 mg total) by mouth daily. For Herpes. 08/16/15   Randall Hiss, MD   BP 86/61 mmHg  Pulse 115  Temp(Src) 98.4 F (36.9 C) (Oral)  Resp 18  Ht 5\' 9"  (1.753 m)  Wt 192 lb 11.2 oz (87.408 kg)  BMI 28.44 kg/m2  SpO2 97% Physical Exam  Constitutional: He is oriented to person, place, and time. He appears well-developed and well-nourished. No distress.  HENT:  Head: Normocephalic.  Eyes: EOM are normal.  Neck: Normal range of motion.  Cardiovascular: Regular rhythm.   Tachycardic  Pulmonary/Chest: Effort normal. No respiratory distress. He has wheezes.  Abdominal: Soft. He exhibits no distension. There is no tenderness.  Musculoskeletal: He  exhibits no edema or tenderness.  Neurological: He is alert and oriented to person, place, and time.  Skin: Skin is warm and dry. He is not diaphoretic.  Nursing note and vitals reviewed.   ED Course  Procedures (including critical care time) Labs Review Labs Reviewed  BASIC METABOLIC PANEL - Abnormal; Notable for the following:    Glucose, Bld 139 (*)    Creatinine, Ser 1.74 (*)    GFR calc non Af Amer 43 (*)    GFR calc Af Amer 50 (*)    All other components within normal limits  CBC - Abnormal; Notable for the following:    Hemoglobin 17.4 (*)    MCH 34.3 (*)    MCHC 36.5 (*)    All other components within normal limits  CULTURE, BLOOD (ROUTINE X 2)  CULTURE, BLOOD (ROUTINE X 2)  URINE CULTURE  URINALYSIS, ROUTINE W REFLEX MICROSCOPIC (NOT AT [facility])  BRAIN NATRIURETIC PEPTIDE  TROPONIN I  I-STAT TROPOININ, ED  I-STAT CG4 LACTIC ACID, ED  Rosezena Sensor, ED    Imaging Review Dg Chest 2 View  09/10/2015   CLINICAL DATA:  Chest pain.  EXAM: CHEST  2 VIEW  COMPARISON:  August 13, 2015.  FINDINGS: The heart size and mediastinal contours are within normal limits. Both lungs are clear. No pneumothorax or pleural effusion is noted. The visualized skeletal structures are unremarkable.  IMPRESSION: No active cardiopulmonary disease.   Electronically Signed   By: Lupita Raider, M.D.   On: 09/10/2015 15:46   I have personally reviewed and evaluated these images and lab results as part of my medical decision-making.   EKG Interpretation   Date/Time:  Friday September 10 2015 14:47:25 EDT Ventricular Rate:  114 PR Interval:  194 QRS Duration: 76 QT Interval:  306 QTC Calculation: 421 R Axis:   61 Text Interpretation:  Sinus tachycardia with occasional Premature  ventricular complexes Anteroseptal infarct , age undetermined Abnormal ECG  No significant change since last tracing Confirmed by ALLEN  MD, ANTHONY  ([id number]) on 09/10/2015 2:47:38 PM      MDM   Hypotension with  tachycardia after taking new [name] this morning. Patient has known CHF but no known CAD (from previous cardiac CTA in 2012), stress test in 2015 at [facility] showed no inducible ischemia.  More than likely these symptoms are the result of hypotension after new BP medications as it was approximately 45 minutes after this time period. However, ACS possible. Initial I stat troponin negative.   Doubt infectious etiology although has had a dry cough with slight sputum production. Afebrile here and high 90's oxygen saturation on RA. Had 3 lb weight loss since yesterday but no diarrhea/vomiting. Patient fluid responsive to NS bolus. Doubt PE as patient is Low Well's criteria and alternate pathology more likely.   Found to have AKI. Continues to improve and has decrease in light headedness.  Still symptomatic lightheadedness and blurred vision. Patient now demands to leave AMA. He states that he wants to smoke and tired of waiting.  We discussed at length that he would need to stay for rehydration, observation and possibly additional imaging and tests.   Patient wants to leave against medical advice. Patient understands that his actions will lead to inadequate medical workup, and that he is at risk of complications of missed diagnosis, which includes morbidity and mortality.  Patient is demonstrating good capacity to make decision. Patient understands that he needs to return to the ER immediately if his/her symptoms get worse.  Final diagnoses:  Near syncope  Orthostatic hypotension  AKI (acute kidney injury)  Congestive heart failure, unspecified congestive heart failure chronicity, unspecified congestive heart failure type       Deirdre Peer, MD 09/11/15 [id number]  Derwood Kaplan, MD 09/12/15 [id number]

## 2015-09-10 NOTE — ED Notes (Signed)
EDP aware that patient is still experiencing headache with blurred vision and lightheadedness.

## 2015-09-10 NOTE — ED Notes (Signed)
Pt states that he was sitting on the bus this morning at 8am when he began having left sided chest pain hat was constant. Pt was also started on a new heart medication this morning.

## 2015-09-10 NOTE — ED Notes (Signed)
Pt. Asking to leave AMA because he states he wants to smoke and eat better food. RN informed MD and pt.signed out AMA

## 2015-09-11 LAB — URINE CULTURE

## 2015-09-14 ENCOUNTER — Ambulatory Visit (HOSPITAL_COMMUNITY)
Admission: RE | Admit: 2015-09-14 | Discharge: 2015-09-14 | Disposition: A | Discharge status: Home / Self Care | Payer: Medicaid Other | Source: Ambulatory Visit | Attending: Adult Health | Admitting: Adult Health

## 2015-09-14 ENCOUNTER — Ambulatory Visit (HOSPITAL_BASED_OUTPATIENT_CLINIC_OR_DEPARTMENT_OTHER)
Admission: RE | Admit: 2015-09-14 | Discharge: 2015-09-14 | Disposition: A | Discharge status: Home / Self Care | Payer: Medicaid Other | Source: Ambulatory Visit | Attending: Cardiology | Admitting: Cardiology

## 2015-09-14 VITALS — BP 88/60 | HR 105 | Wt 197.5 lb

## 2015-09-14 DIAGNOSIS — Z7982 Long term (current) use of aspirin: Secondary | ICD-10-CM | POA: Diagnosis not present

## 2015-09-14 DIAGNOSIS — F4310 Post-traumatic stress disorder, unspecified: Secondary | ICD-10-CM | POA: Diagnosis not present

## 2015-09-14 DIAGNOSIS — Z79899 Other long term (current) drug therapy: Secondary | ICD-10-CM | POA: Insufficient documentation

## 2015-09-14 DIAGNOSIS — I429 Cardiomyopathy, unspecified: Secondary | ICD-10-CM | POA: Insufficient documentation

## 2015-09-14 DIAGNOSIS — I5022 Chronic systolic (congestive) heart failure: Secondary | ICD-10-CM | POA: Diagnosis not present

## 2015-09-14 DIAGNOSIS — F419 Anxiety disorder, unspecified: Secondary | ICD-10-CM | POA: Diagnosis not present

## 2015-09-14 DIAGNOSIS — I10 Essential (primary) hypertension: Secondary | ICD-10-CM | POA: Insufficient documentation

## 2015-09-14 DIAGNOSIS — J449 Chronic obstructive pulmonary disease, unspecified: Secondary | ICD-10-CM | POA: Insufficient documentation

## 2015-09-14 DIAGNOSIS — F319 Bipolar disorder, unspecified: Secondary | ICD-10-CM | POA: Diagnosis not present

## 2015-09-14 DIAGNOSIS — B20 Human immunodeficiency virus [HIV] disease: Secondary | ICD-10-CM | POA: Diagnosis not present

## 2015-09-14 DIAGNOSIS — F17210 Nicotine dependence, cigarettes, uncomplicated: Secondary | ICD-10-CM | POA: Insufficient documentation

## 2015-09-14 LAB — BASIC METABOLIC PANEL
ANION GAP: 8 (ref 5–15)
BUN: 11 mg/dL (ref 6–20)
CHLORIDE: 106 mmol/L (ref 101–111)
CO2: 27 mmol/L (ref 22–32)
Calcium: 9.3 mg/dL (ref 8.9–10.3)
Creatinine, Ser: 1.26 mg/dL — ABNORMAL HIGH (ref 0.61–1.24)
GFR calc non Af Amer: >60 mL/min (ref >60)
Glucose, Bld: 82 mg/dL (ref 65–99)
POTASSIUM: 4.2 mmol/L (ref 3.5–5.1)
Sodium: 141 mmol/L (ref 135–145)

## 2015-09-14 LAB — BRAIN NATRIURETIC PEPTIDE: B NATRIURETIC PEPTIDE 5: 22.2 pg/mL (ref 0.0–100.0)

## 2015-09-14 MED ORDER — METOPROLOL SUCCINATE ER 25 MG PO TB24: 25.0000 mg | ORAL_TABLET | Freq: Every day | ORAL | Status: DC

## 2015-09-14 NOTE — Progress Notes (Signed)
Advanced Heart Failure Medication Review by a Pharmacist  Does the patient  feel that his/her medications are working for him/her?  yes  Has the patient been experiencing any side effects to the medications prescribed?  no  Does the patient measure his/her own blood pressure or blood glucose at home?  no   Does the patient have any problems obtaining medications due to transportation or finances?   no  Understanding of regimen: good Understanding of indications: good Potential of compliance: good    Pharmacist comments:  Ryan Long is a pleasant 54 yo M presenting with a current medication list. He seems to have a good understanding of his regimen and the importance of consistent use. He reports good compliance with his medications but states that he has not taken [name] since it was started last Friday before he went to the ED. Since then he has had a persistent headache, nausea, dizziness and abdominal fullness. He does state that he has been taking lasix daily since Friday except for yesterday for increased weight and abdominal fullness. He did not have any specific medication-related questions or concerns for me at this time.   [name]. Bonnye Fava, PharmD, BCPS, CPP Clinical Pharmacist Pager: [phone] Phone: [phone] 09/14/2015 2:36 PM

## 2015-09-14 NOTE — Progress Notes (Signed)
  Echocardiogram 2D Echocardiogram has been performed.  Ryan Long 09/14/2015, 10:58 AM

## 2015-09-14 NOTE — Patient Instructions (Signed)
Stop [name]  Stop Furosemide  Stop Lisinopril  Change Metoprolol to take at bedtime   Labs today  Keep follow up as scheduled 09/28/15

## 2015-09-15 LAB — CULTURE, BLOOD (ROUTINE X 2)
CULTURE: NO GROWTH
Culture: NO GROWTH

## 2015-09-15 NOTE — Progress Notes (Signed)
Patient ID: Ryan Long, male   DOB: 10-07-1961, 54 y.o.   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  Coronary CT angiogram in 3/12 demonstrated no CAD.  Cardiolite in 2015 showed no ischemia or infarction.  NICM was not felt to be related to HIV given his good CD4 counts.    No ETOH or drugs but continues to smoke.  He was seen last week by Ryan Long and [name] was added.   After taking 1 dose of [name], patient developed headache and felt dizzy with blurred vision.  He went to the ER on 9/23.  He was hypotensive.  CXR and CT head ok.  Labs ok.  He got IV fluid.  He left AMA to go home.  He has not taken anymore [name], and he has not had any Lasix since Sunday.  No chest pain. Still has headache but milder.  Still has lightheadedness with standing. He is orthostatic today.  No fever, no cough, no diarrhea.  He is eating/drinking normally.  Stable dyspnea after walking about a block.  ECHO 06/2013 EF 35-40% ECHO 9/16 with EF 30-35%, diffuse hypokinesis, grade I diastolic dysfunction,   Past Medical History  Diagnosis Date  . HIV (human immunodeficiency virus infection)   . Chronic systolic heart failure   . Hypertension   . Genital herpes   . Active smoker   . AR (allergic rhinitis)   . HLD (hyperlipidemia)   . NICM (nonischemic cardiomyopathy)     Echocardiogram 06/28/11: EF 30-35%, mild MR, mild LAE;  No CAD by coronary CT angiogram 3/12 at [facility]  . NSVT (nonsustained ventricular tachycardia)   . Depression   . Bipolar 1 disorder   . Alcohol abuse   . Crack cocaine use   . COPD (chronic obstructive pulmonary disease)   . CHF (congestive heart failure)   . Anxiety   . PTSD (post-traumatic stress disorder)     Current Outpatient Prescriptions  Medication Sig Dispense Refill  .  acetaminophen (TYLENOL) 500 MG tablet Take 1,000 mg by mouth every 6 (six) hours as needed for mild pain.    Marland Kitchen albuterol (PROVENTIL HFA;VENTOLIN HFA) 108 (90 BASE) MCG/ACT inhaler Inhale 2 puffs into the lungs every 4 (four) hours as needed for shortness of breath. 1 Inhaler 0  . aspirin 81 MG chewable tablet Chew 1 tablet (81 mg total) by mouth every morning. For heart health    . buPROPion (WELLBUTRIN XL) 150 MG 24 hr tablet Take 1 tablet (150 mg total) by mouth daily. 30 tablet 11  . digoxin (LANOXIN) 0.125 MG tablet Take 1 tablet (125 mcg total) by mouth daily. For control of heart arrythmia.    Marland Kitchen dolutegravir (TIVICAY) 50 MG tablet Take 1 tablet (50 mg total) by mouth daily. 30 tablet 11  . emtricitabine-tenofovir AF (DESCOVY) 200-25 MG per tablet Take 1 tablet by mouth daily. 30 tablet 11  . hydrOXYzine (ATARAX/VISTARIL) 25 MG tablet Take 1 tablet (25 mg total) by mouth at bedtime as needed for anxiety. 30 tablet 11  . metoprolol succinate (TOPROL-XL) 25 MG 24 hr tablet Take 1 tablet (25 mg total) by mouth at bedtime. For high blood pressure    . Multiple Vitamin (MULTI-VITAMINS) TABS Take 1 tablet by mouth daily. For low vitamin 30 tablet   . potassium chloride (K-DUR,KLOR-CON) 10  MEQ tablet Take 1 tablet (10 mEq total) by mouth as needed (take only when you take lasix). For low potassium 30 tablet 0  . spironolactone (ALDACTONE) 25 MG tablet Take 0.5 tablets (12.5 mg total) by mouth daily. For heart condition 30 tablet 0  . traZODone (DESYREL) 150 MG tablet Take 1 tablet (150 mg total) by mouth at bedtime. 30 tablet 11  . valACYclovir (VALTREX) 500 MG tablet Take 1 tablet (500 mg total) by mouth daily. For Herpes. 30 tablet 11  . nicotine (RA NICOTINE) 21 mg/24hr patch Place 1 patch (21 mg total) onto the skin daily. For nicotine addiction (Patient not taking: Reported on 09/14/2015) 28 patch 0   No current facility-administered medications for this encounter.    Allergies: No Known  Allergies  ROS:  All systems reviewed and negative except as per HP.   Vital Signs: BP 88/60 mmHg  Pulse 105  Wt 197 lb 8 oz (89.585 kg)  SpO2 96%  Filed Vitals:   09/14/15 1417  BP: 88/60  Pulse: 105  Weight: 197 lb 8 oz (89.585 kg)  SpO2: 96%     PHYSICAL EXAM: Well nourished, well developed, in no acute distres. Ambulated in the clinic without difficulty.  HEENT: normal Neck: Minimally elevated JVP flat  Cardiac:  normal S1, S2; RRR; no murmur; No S3 Lungs:  clear to auscultation bilaterally, no rhonchi or rales, faint expiratory wheezing noted Abd: soft, nontender, no hepatomegaly Ext: no edema Skin: warm and dry Neuro:  CNs 2-12 intact, no focal abnormalities noted Psych: Normal affect  ASSESSMENT AND PLAN:   1. Chronic Systolic Heart Failure: Nonischemic cardiomyopathy.  Echo (9/16) with EF 30-35%, diffuse hypokinesis.  NYHA class II, not volume overloaded. BP has been low with orthostasis. ?Overdiuresis.  No evidence for infection.  - Hold Lasix and lisinopril. He can continue low doses of Toprol XL and spironolactone. - Continue digoxin, will need to check level. - BMET/BNP today.   - Would repeat echo in 3-4 months with medical treatment.  If EF remains low, will need ICD.  2. HIV: Followed by Dr Ryan Long  3. Currrent Smoker: 1/2 PPD, I strongly encouraged him to quit.   Ryan Long 09/15/2015

## 2015-09-16 ENCOUNTER — Emergency Department (HOSPITAL_COMMUNITY)
Admission: EM | Admit: 2015-09-16 | Discharge: 2015-09-16 | Disposition: A | Discharge status: Home / Self Care | Payer: Medicaid Other | Attending: Emergency Medicine | Admitting: Emergency Medicine

## 2015-09-16 ENCOUNTER — Encounter (HOSPITAL_COMMUNITY): Payer: Self-pay | Admitting: Emergency Medicine

## 2015-09-16 ENCOUNTER — Emergency Department (HOSPITAL_COMMUNITY): Payer: Medicaid Other

## 2015-09-16 DIAGNOSIS — Z7982 Long term (current) use of aspirin: Secondary | ICD-10-CM | POA: Insufficient documentation

## 2015-09-16 DIAGNOSIS — Z79899 Other long term (current) drug therapy: Secondary | ICD-10-CM | POA: Insufficient documentation

## 2015-09-16 DIAGNOSIS — F419 Anxiety disorder, unspecified: Secondary | ICD-10-CM | POA: Diagnosis not present

## 2015-09-16 DIAGNOSIS — I5022 Chronic systolic (congestive) heart failure: Secondary | ICD-10-CM | POA: Insufficient documentation

## 2015-09-16 DIAGNOSIS — Z8619 Personal history of other infectious and parasitic diseases: Secondary | ICD-10-CM | POA: Diagnosis not present

## 2015-09-16 DIAGNOSIS — I10 Essential (primary) hypertension: Secondary | ICD-10-CM | POA: Diagnosis not present

## 2015-09-16 DIAGNOSIS — J209 Acute bronchitis, unspecified: Secondary | ICD-10-CM

## 2015-09-16 DIAGNOSIS — Z720 Tobacco use: Secondary | ICD-10-CM | POA: Diagnosis not present

## 2015-09-16 DIAGNOSIS — Z8639 Personal history of other endocrine, nutritional and metabolic disease: Secondary | ICD-10-CM | POA: Insufficient documentation

## 2015-09-16 DIAGNOSIS — F319 Bipolar disorder, unspecified: Secondary | ICD-10-CM | POA: Diagnosis not present

## 2015-09-16 DIAGNOSIS — B20 Human immunodeficiency virus [HIV] disease: Secondary | ICD-10-CM | POA: Diagnosis not present

## 2015-09-16 DIAGNOSIS — J441 Chronic obstructive pulmonary disease with (acute) exacerbation: Secondary | ICD-10-CM | POA: Insufficient documentation

## 2015-09-16 DIAGNOSIS — F4310 Post-traumatic stress disorder, unspecified: Secondary | ICD-10-CM | POA: Insufficient documentation

## 2015-09-16 DIAGNOSIS — R05 Cough: Secondary | ICD-10-CM | POA: Diagnosis present

## 2015-09-16 MED ORDER — ALBUTEROL SULFATE HFA 108 (90 BASE) MCG/ACT IN AERS: 2.0000 | INHALATION_SPRAY | RESPIRATORY_TRACT | Status: DC | PRN

## 2015-09-16 MED ORDER — PREDNISONE 20 MG PO TABS: 40.0000 mg | ORAL_TABLET | Freq: Every day | ORAL | Status: DC

## 2015-09-16 MED ORDER — AZITHROMYCIN 250 MG PO TABS: 250.0000 mg | ORAL_TABLET | Freq: Every day | ORAL | Status: DC

## 2015-09-16 MED ORDER — IBUPROFEN 200 MG PO TABS
600.0000 mg | ORAL_TABLET | Freq: Once | ORAL | Status: AC
  Administered 2015-09-16: 600 mg via ORAL
  Filled 2015-09-16: qty 3

## 2015-09-16 NOTE — ED Notes (Signed)
To ED via private vehicle with c/o flu like symptoms-- started 9 days ago- coughing up yellow sputum, body aches, chills, was here on the 23rd for unrelated complaint.

## 2015-09-16 NOTE — Discharge Instructions (Signed)

## 2015-09-16 NOTE — ED Provider Notes (Signed)
CSN: [id number]     Arrival date & time 09/16/15  2426 History   First MD Initiated Contact with Patient 09/16/15 [phone]     Chief Complaint  Patient presents with  . Cough  . Chills  . Generalized Body Aches     (Consider location/radiation/quality/duration/timing/severity/associated sxs/prior Treatment) HPI Comments: Patient presents to the emergency department for evaluation of upper respiratory symptoms. Patient reports that he has been sick for approximately 9 days. Patient reports cough and chest congestion. He has been wheezing, but it improves when he uses an inhaler. Cough has become more productive, sputum now yellow and green. No hemoptysis. He has been feeling weak and achy, but has not taken her temperature. He also has sneezing and nasal congestion. No abdominal pain, nausea, vomiting or diarrhea.  Patient is a 54 y.o. male presenting with cough.  Cough Associated symptoms: wheezing     Past Medical History  Diagnosis Date  . HIV (human immunodeficiency virus infection)   . Chronic systolic heart failure   . Hypertension   . Genital herpes   . Active smoker   . AR (allergic rhinitis)   . HLD (hyperlipidemia)   . NICM (nonischemic cardiomyopathy)     Echocardiogram 06/28/11: EF 30-35%, mild MR, mild LAE;  No CAD by coronary CT angiogram 3/12 at [facility]  . NSVT (nonsustained ventricular tachycardia)   . Depression   . Bipolar 1 disorder   . Alcohol abuse   . Crack cocaine use   . COPD (chronic obstructive pulmonary disease)   . CHF (congestive heart failure)   . Anxiety   . PTSD (post-traumatic stress disorder)    History reviewed. No pertinent past surgical history. Family History  Problem Relation Age of Onset  . Hypertension Father   . CAD Father    Social History  Substance Use Topics  . Smoking status: Current Every Day Smoker -- 0.00 packs/day for 30 years    Types: Cigarettes  . Smokeless tobacco: Never Used     Comment: 15  cigarettes daily  . Alcohol Use: Yes    Review of Systems  HENT: Positive for congestion.   Respiratory: Positive for cough and wheezing.   All other systems reviewed and are negative.     Allergies  Review of patient's allergies indicates no known allergies.  Home Medications   Prior to Admission medications   Medication Sig Start Date End Date Taking? Authorizing Provider  acetaminophen (TYLENOL) 500 MG tablet Take 1,000 mg by mouth every 6 (six) hours as needed for mild pain.    Historical Provider, MD  albuterol (PROVENTIL HFA;VENTOLIN HFA) 108 (90 BASE) MCG/ACT inhaler Inhale 2 puffs into the lungs every 4 (four) hours as needed for shortness of breath. 08/12/15   Shuvon B Rankin, NP  aspirin 81 MG chewable tablet Chew 1 tablet (81 mg total) by mouth every morning. For heart health 08/31/14   Sanjuana Kava, NP  buPROPion (WELLBUTRIN XL) 150 MG 24 hr tablet Take 1 tablet (150 mg total) by mouth daily. 08/16/15   Randall Hiss, MD  digoxin (LANOXIN) 0.125 MG tablet Take 1 tablet (125 mcg total) by mouth daily. For control of heart arrythmia. 08/31/14   Sanjuana Kava, NP  dolutegravir (TIVICAY) 50 MG tablet Take 1 tablet (50 mg total) by mouth daily. 08/16/15   Randall Hiss, MD  emtricitabine-tenofovir AF (DESCOVY) 200-25 MG per tablet Take 1 tablet by mouth daily. 08/16/15   Rich Reining  Dam, MD  hydrOXYzine (ATARAX/VISTARIL) 25 MG tablet Take 1 tablet (25 mg total) by mouth at bedtime as needed for anxiety. 08/16/15   Randall Hiss, MD  metoprolol succinate (TOPROL-XL) 25 MG 24 hr tablet Take 1 tablet (25 mg total) by mouth at bedtime. For high blood pressure 09/14/15   Laurey Morale, MD  Multiple Vitamin (MULTI-VITAMINS) TABS Take 1 tablet by mouth daily. For low vitamin 08/31/14   Sanjuana Kava, NP  nicotine (RA NICOTINE) 21 mg/24hr patch Place 1 patch (21 mg total) onto the skin daily. For nicotine addiction Patient not taking: Reported on 09/14/2015 08/31/14   Sanjuana Kava, NP  potassium chloride (K-DUR,KLOR-CON) 10 MEQ tablet Take 1 tablet (10 mEq total) by mouth as needed (take only when you take lasix). For low potassium 09/09/15   Amy D Clegg, NP  spironolactone (ALDACTONE) 25 MG tablet Take 0.5 tablets (12.5 mg total) by mouth daily. For heart condition 08/18/15   Jaclyn Shaggy, MD  traZODone (DESYREL) 150 MG tablet Take 1 tablet (150 mg total) by mouth at bedtime. 08/16/15   Randall Hiss, MD  valACYclovir (VALTREX) 500 MG tablet Take 1 tablet (500 mg total) by mouth daily. For Herpes. 08/16/15   Randall Hiss, MD   BP 109/76 mmHg  Pulse 101  Temp(Src) 98 F (36.7 C) (Oral)  Resp 16  Ht  (1.753 m)  SpO2 97% Physical Exam  Constitutional: He is oriented to person, place, and time. He appears well-developed and well-nourished. No distress.  HENT:  Head: Normocephalic and atraumatic.  Right Ear: Hearing normal.  Left Ear: Hearing normal.  Nose: Nose normal.  Mouth/Throat: Oropharynx is clear and moist and mucous membranes are normal.  Eyes: Conjunctivae and EOM are normal. Pupils are equal, round, and reactive to light.  Neck: Normal range of motion. Neck supple.  Cardiovascular: Regular rhythm, S1 normal and S2 normal.  Exam reveals no gallop and no friction rub.   No murmur heard. Pulmonary/Chest: Effort normal and breath sounds normal. No respiratory distress. He exhibits no tenderness.  Abdominal: Soft. Normal appearance and bowel sounds are normal. There is no hepatosplenomegaly. There is no tenderness. There is no rebound, no guarding, no tenderness at McBurney's point and negative Murphy's sign. No hernia.  Musculoskeletal: Normal range of motion.  Neurological: He is alert and oriented to person, place, and time. He has normal strength. No cranial nerve deficit or sensory deficit. Coordination normal. GCS eye subscore is 4. GCS verbal subscore is 5. GCS motor subscore is 6.  Skin: Skin is warm, dry and intact. No rash noted.  No cyanosis.  Psychiatric: He has a normal mood and affect. His speech is normal and behavior is normal. Thought content normal.  Nursing note and vitals reviewed.   ED Course  Procedures (including critical care time) Labs Review Labs Reviewed - No data to display  Imaging Review No results found. I have personally reviewed and evaluated these images and lab results as part of my medical decision-making.   EKG Interpretation None      MDM   Final diagnoses:  None   bronchitis  Patient presents to the ER for evaluation of cough and upper respiratory infection symptoms. Symptoms have been ongoing for 9 days. Patient reports progressive worsening symptoms. This includes wheezing as well as productive cough. Chest x-ray did not show pneumonia. Patient does have a history of HIV, but has been well controlled. Oxygen saturations are normal  here in the ER. He is afebrile. Because he is a smoker and has had persistent symptoms, will benefit from antibiotic's. We'll also prescribe short course of prednisone. Continue albuterol therapy.    Gilda Crease, MD 09/16/15 [phone]

## 2015-09-17 ENCOUNTER — Ambulatory Visit: Payer: Self-pay | Admitting: Family Medicine

## 2015-09-22 ENCOUNTER — Emergency Department (HOSPITAL_COMMUNITY)
Admission: EM | Admit: 2015-09-22 | Discharge: 2015-09-23 | Disposition: A | Discharge status: Other Acute Inpatient Hospital | Payer: MEDICAID | Attending: Emergency Medicine | Admitting: Emergency Medicine

## 2015-09-22 DIAGNOSIS — Z8619 Personal history of other infectious and parasitic diseases: Secondary | ICD-10-CM | POA: Insufficient documentation

## 2015-09-22 DIAGNOSIS — J449 Chronic obstructive pulmonary disease, unspecified: Secondary | ICD-10-CM | POA: Diagnosis not present

## 2015-09-22 DIAGNOSIS — F319 Bipolar disorder, unspecified: Secondary | ICD-10-CM | POA: Insufficient documentation

## 2015-09-22 DIAGNOSIS — Z79899 Other long term (current) drug therapy: Secondary | ICD-10-CM | POA: Insufficient documentation

## 2015-09-22 DIAGNOSIS — F1210 Cannabis abuse, uncomplicated: Secondary | ICD-10-CM | POA: Insufficient documentation

## 2015-09-22 DIAGNOSIS — Z5900 Homelessness unspecified: Secondary | ICD-10-CM

## 2015-09-22 DIAGNOSIS — R45851 Suicidal ideations: Secondary | ICD-10-CM | POA: Diagnosis present

## 2015-09-22 DIAGNOSIS — Z21 Asymptomatic human immunodeficiency virus [HIV] infection status: Secondary | ICD-10-CM | POA: Insufficient documentation

## 2015-09-22 DIAGNOSIS — F419 Anxiety disorder, unspecified: Secondary | ICD-10-CM | POA: Diagnosis not present

## 2015-09-22 DIAGNOSIS — Z720 Tobacco use: Secondary | ICD-10-CM | POA: Diagnosis not present

## 2015-09-22 DIAGNOSIS — I5022 Chronic systolic (congestive) heart failure: Secondary | ICD-10-CM | POA: Insufficient documentation

## 2015-09-22 DIAGNOSIS — I10 Essential (primary) hypertension: Secondary | ICD-10-CM | POA: Diagnosis not present

## 2015-09-22 DIAGNOSIS — F1410 Cocaine abuse, uncomplicated: Secondary | ICD-10-CM | POA: Insufficient documentation

## 2015-09-22 DIAGNOSIS — Z7982 Long term (current) use of aspirin: Secondary | ICD-10-CM | POA: Insufficient documentation

## 2015-09-22 NOTE — ED Notes (Signed)
Pt states that he has a history of anxiety and depression; pt states that he has had increased depression over the last week or so; pt denies anything happening that caused him to be more depressed; pt states that since Monday he has been having suicidal thoughts; pt states that he has a plan to jump out in traffic; pt was brought in by [facility] tonight after being out in traffic

## 2015-09-23 ENCOUNTER — Encounter (HOSPITAL_COMMUNITY): Payer: Self-pay | Admitting: *Deleted

## 2015-09-23 ENCOUNTER — Inpatient Hospital Stay (HOSPITAL_COMMUNITY)
Admission: EM | Admit: 2015-09-23 | Discharge: 2015-09-28 | DRG: 885 | Disposition: A | Discharge status: Home / Self Care | Payer: MEDICAID | Source: Intra-hospital | Attending: Psychiatry | Admitting: Psychiatry

## 2015-09-23 ENCOUNTER — Encounter (HOSPITAL_COMMUNITY): Payer: Self-pay

## 2015-09-23 DIAGNOSIS — F17210 Nicotine dependence, cigarettes, uncomplicated: Secondary | ICD-10-CM | POA: Diagnosis present

## 2015-09-23 DIAGNOSIS — F4310 Post-traumatic stress disorder, unspecified: Secondary | ICD-10-CM | POA: Diagnosis present

## 2015-09-23 DIAGNOSIS — I10 Essential (primary) hypertension: Secondary | ICD-10-CM | POA: Diagnosis present

## 2015-09-23 DIAGNOSIS — B20 Human immunodeficiency virus [HIV] disease: Secondary | ICD-10-CM | POA: Diagnosis present

## 2015-09-23 DIAGNOSIS — F1020 Alcohol dependence, uncomplicated: Secondary | ICD-10-CM | POA: Diagnosis present

## 2015-09-23 DIAGNOSIS — G4700 Insomnia, unspecified: Secondary | ICD-10-CM | POA: Diagnosis present

## 2015-09-23 DIAGNOSIS — F1410 Cocaine abuse, uncomplicated: Secondary | ICD-10-CM | POA: Diagnosis not present

## 2015-09-23 DIAGNOSIS — Z8249 Family history of ischemic heart disease and other diseases of the circulatory system: Secondary | ICD-10-CM | POA: Diagnosis not present

## 2015-09-23 DIAGNOSIS — F319 Bipolar disorder, unspecified: Secondary | ICD-10-CM | POA: Diagnosis not present

## 2015-09-23 DIAGNOSIS — R45851 Suicidal ideations: Secondary | ICD-10-CM | POA: Diagnosis present

## 2015-09-23 DIAGNOSIS — F332 Major depressive disorder, recurrent severe without psychotic features: Principal | ICD-10-CM | POA: Diagnosis present

## 2015-09-23 DIAGNOSIS — F1010 Alcohol abuse, uncomplicated: Secondary | ICD-10-CM

## 2015-09-23 DIAGNOSIS — F410 Panic disorder [episodic paroxysmal anxiety] without agoraphobia: Secondary | ICD-10-CM | POA: Diagnosis present

## 2015-09-23 DIAGNOSIS — E785 Hyperlipidemia, unspecified: Secondary | ICD-10-CM | POA: Diagnosis present

## 2015-09-23 LAB — RAPID URINE DRUG SCREEN, HOSP PERFORMED
Amphetamines: NOT DETECTED
BARBITURATES: NOT DETECTED
Benzodiazepines: NOT DETECTED
Cocaine: POSITIVE — AB
OPIATES: NOT DETECTED
TETRAHYDROCANNABINOL: POSITIVE — AB

## 2015-09-23 LAB — COMPREHENSIVE METABOLIC PANEL
ALK PHOS: 56 U/L (ref 38–126)
ALT: 32 U/L (ref 17–63)
AST: 37 U/L (ref 15–41)
Albumin: 4 g/dL (ref 3.5–5.0)
Anion gap: 6 (ref 5–15)
BUN: 15 mg/dL (ref 6–20)
CALCIUM: 8.4 mg/dL — AB (ref 8.9–10.3)
CO2: 25 mmol/L (ref 22–32)
CREATININE: 1.11 mg/dL (ref 0.61–1.24)
Chloride: 104 mmol/L (ref 101–111)
Glucose, Bld: 178 mg/dL — ABNORMAL HIGH (ref 65–99)
Potassium: 3 mmol/L — ABNORMAL LOW (ref 3.5–5.1)
SODIUM: 135 mmol/L (ref 135–145)
Total Bilirubin: 1.8 mg/dL — ABNORMAL HIGH (ref 0.3–1.2)
Total Protein: 6.9 g/dL (ref 6.5–8.1)

## 2015-09-23 LAB — CBC
HCT: 49.2 % (ref 39.0–52.0)
HEMOGLOBIN: 17.7 g/dL — AB (ref 13.0–17.0)
MCH: 33.8 pg (ref 26.0–34.0)
MCHC: 36 g/dL (ref 30.0–36.0)
MCV: 94.1 fL (ref 78.0–100.0)
Platelets: 185 10*3/uL (ref 150–400)
RBC: 5.23 MIL/uL (ref 4.22–5.81)
RDW: 12.8 % (ref 11.5–15.5)
WBC: 4.6 10*3/uL (ref 4.0–10.5)

## 2015-09-23 LAB — SALICYLATE LEVEL

## 2015-09-23 LAB — ETHANOL: Alcohol, Ethyl (B): <5 mg/dL (ref <5)

## 2015-09-23 LAB — ACETAMINOPHEN LEVEL: Acetaminophen (Tylenol), Serum: <10 ug/mL — ABNORMAL LOW (ref 10–30)

## 2015-09-23 MED ORDER — METOPROLOL SUCCINATE ER 25 MG PO TB24
25.0000 mg | ORAL_TABLET | Freq: Every day | ORAL | Status: DC
  Administered 2015-09-23: 25 mg via ORAL
  Filled 2015-09-23 (×2): qty 1

## 2015-09-23 MED ORDER — DOLUTEGRAVIR SODIUM 50 MG PO TABS
50.0000 mg | ORAL_TABLET | Freq: Every day | ORAL | Status: DC
  Administered 2015-09-23 – 2015-09-28 (×4): 50 mg via ORAL
  Filled 2015-09-23 (×8): qty 1

## 2015-09-23 MED ORDER — DOLUTEGRAVIR SODIUM 50 MG PO TABS
50.0000 mg | ORAL_TABLET | Freq: Every day | ORAL | Status: DC
  Filled 2015-09-23: qty 1

## 2015-09-23 MED ORDER — ALUM & MAG HYDROXIDE-SIMETH 200-200-20 MG/5ML PO SUSP: 30.0000 mL | ORAL | Status: DC | PRN

## 2015-09-23 MED ORDER — TRAZODONE HCL 50 MG PO TABS
150.0000 mg | ORAL_TABLET | Freq: Every day | ORAL | Status: DC
  Administered 2015-09-23: 150 mg via ORAL
  Filled 2015-09-23: qty 1

## 2015-09-23 MED ORDER — POTASSIUM CHLORIDE CRYS ER 20 MEQ PO TBCR
40.0000 meq | EXTENDED_RELEASE_TABLET | Freq: Once | ORAL | Status: AC
  Administered 2015-09-23: 40 meq via ORAL
  Filled 2015-09-23: qty 2

## 2015-09-23 MED ORDER — ASPIRIN 81 MG PO CHEW
81.0000 mg | CHEWABLE_TABLET | Freq: Every morning | ORAL | Status: DC
  Administered 2015-09-23 – 2015-09-28 (×4): 81 mg via ORAL
  Filled 2015-09-23 (×10): qty 1

## 2015-09-23 MED ORDER — POTASSIUM CHLORIDE CRYS ER 10 MEQ PO TBCR: 10.0000 meq | EXTENDED_RELEASE_TABLET | ORAL | Status: DC | PRN

## 2015-09-23 MED ORDER — POTASSIUM CHLORIDE CRYS ER 20 MEQ PO TBCR
20.0000 meq | EXTENDED_RELEASE_TABLET | Freq: Two times a day (BID) | ORAL | Status: AC
  Administered 2015-09-23 (×2): 20 meq via ORAL
  Filled 2015-09-23 (×4): qty 1

## 2015-09-23 MED ORDER — VALACYCLOVIR HCL 500 MG PO TABS
500.0000 mg | ORAL_TABLET | Freq: Every day | ORAL | Status: DC
  Filled 2015-09-23: qty 1

## 2015-09-23 MED ORDER — HYDROXYZINE HCL 25 MG PO TABS: 25.0000 mg | ORAL_TABLET | Freq: Every evening | ORAL | Status: DC | PRN

## 2015-09-23 MED ORDER — EMTRICITABINE-TENOFOVIR AF 200-25 MG PO TABS
1.0000 | ORAL_TABLET | Freq: Every day | ORAL | Status: DC
  Filled 2015-09-23: qty 1

## 2015-09-23 MED ORDER — DIGOXIN 125 MCG PO TABS
125.0000 ug | ORAL_TABLET | Freq: Every day | ORAL | Status: DC
  Filled 2015-09-23: qty 1

## 2015-09-23 MED ORDER — ACETAMINOPHEN 325 MG PO TABS: 650.0000 mg | ORAL_TABLET | ORAL | Status: DC | PRN

## 2015-09-23 MED ORDER — HYDROXYZINE HCL 25 MG PO TABS
25.0000 mg | ORAL_TABLET | ORAL | Status: DC | PRN
  Administered 2015-09-24 – 2015-09-26 (×5): 25 mg via ORAL
  Filled 2015-09-23 (×5): qty 1

## 2015-09-23 MED ORDER — NICOTINE 21 MG/24HR TD PT24: 21.0000 mg | MEDICATED_PATCH | Freq: Every day | TRANSDERMAL | Status: DC | PRN

## 2015-09-23 MED ORDER — ALBUTEROL SULFATE HFA 108 (90 BASE) MCG/ACT IN AERS: 2.0000 | INHALATION_SPRAY | RESPIRATORY_TRACT | Status: DC | PRN

## 2015-09-23 MED ORDER — ONDANSETRON HCL 4 MG PO TABS: 4.0000 mg | ORAL_TABLET | Freq: Three times a day (TID) | ORAL | Status: DC | PRN

## 2015-09-23 MED ORDER — CHLORDIAZEPOXIDE HCL 25 MG PO CAPS
25.0000 mg | ORAL_CAPSULE | Freq: Three times a day (TID) | ORAL | Status: DC | PRN
Start: 2015-09-23 — End: 2015-09-28
  Administered 2015-09-23 – 2015-09-26 (×3): 25 mg via ORAL
  Filled 2015-09-23 (×3): qty 1

## 2015-09-23 MED ORDER — VALACYCLOVIR HCL 500 MG PO TABS
500.0000 mg | ORAL_TABLET | Freq: Every day | ORAL | Status: DC
  Administered 2015-09-23 – 2015-09-28 (×4): 500 mg via ORAL
  Filled 2015-09-23 (×8): qty 1

## 2015-09-23 MED ORDER — BUPROPION HCL ER (XL) 150 MG PO TB24
150.0000 mg | ORAL_TABLET | Freq: Every day | ORAL | Status: DC
  Administered 2015-09-23: 150 mg via ORAL
  Filled 2015-09-23 (×5): qty 1

## 2015-09-23 MED ORDER — EMTRICITABINE-TENOFOVIR AF 200-25 MG PO TABS
1.0000 | ORAL_TABLET | Freq: Every day | ORAL | Status: DC
  Administered 2015-09-23 – 2015-09-28 (×4): 1 via ORAL
  Filled 2015-09-23 (×8): qty 1

## 2015-09-23 MED ORDER — DIGOXIN 125 MCG PO TABS
125.0000 ug | ORAL_TABLET | Freq: Every day | ORAL | Status: DC
  Administered 2015-09-23 – 2015-09-28 (×4): 125 ug via ORAL
  Filled 2015-09-23 (×10): qty 1

## 2015-09-23 MED ORDER — SPIRONOLACTONE 12.5 MG HALF TABLET
12.5000 mg | ORAL_TABLET | Freq: Every day | ORAL | Status: DC
  Filled 2015-09-23: qty 1

## 2015-09-23 MED ORDER — ASPIRIN 81 MG PO CHEW
81.0000 mg | CHEWABLE_TABLET | Freq: Every morning | ORAL | Status: DC
Start: 2015-09-23 — End: 2015-09-23

## 2015-09-23 MED ORDER — ACETAMINOPHEN 325 MG PO TABS
650.0000 mg | ORAL_TABLET | Freq: Four times a day (QID) | ORAL | Status: DC | PRN
Start: 2015-09-23 — End: 2015-09-28

## 2015-09-23 MED ORDER — SPIRONOLACTONE 12.5 MG HALF TABLET
12.5000 mg | ORAL_TABLET | Freq: Every day | ORAL | Status: DC
  Administered 2015-09-23 – 2015-09-28 (×4): 12.5 mg via ORAL
  Filled 2015-09-23 (×8): qty 1

## 2015-09-23 MED ORDER — TRAZODONE HCL 150 MG PO TABS
150.0000 mg | ORAL_TABLET | Freq: Every day | ORAL | Status: DC
  Administered 2015-09-23 – 2015-09-27 (×5): 150 mg via ORAL
  Filled 2015-09-23 (×7): qty 1

## 2015-09-23 MED ORDER — IBUPROFEN 200 MG PO TABS: 600.0000 mg | ORAL_TABLET | Freq: Three times a day (TID) | ORAL | Status: DC | PRN

## 2015-09-23 MED ORDER — ADULT MULTIVITAMIN W/MINERALS CH
1.0000 | ORAL_TABLET | Freq: Every day | ORAL | Status: DC
  Administered 2015-09-23 – 2015-09-28 (×4): 1 via ORAL
  Filled 2015-09-23 (×8): qty 1

## 2015-09-23 MED ORDER — HYDROXYZINE HCL 50 MG PO TABS
50.0000 mg | ORAL_TABLET | Freq: Once | ORAL | Status: AC
Start: 2015-09-23 — End: 2015-09-23
  Administered 2015-09-23: 50 mg via ORAL
  Filled 2015-09-23 (×2): qty 1

## 2015-09-23 MED ORDER — ZOLPIDEM TARTRATE 5 MG PO TABS: 5.0000 mg | ORAL_TABLET | Freq: Every evening | ORAL | Status: DC | PRN

## 2015-09-23 MED ORDER — METOPROLOL SUCCINATE ER 25 MG PO TB24
25.0000 mg | ORAL_TABLET | Freq: Every day | ORAL | Status: DC
  Administered 2015-09-23 – 2015-09-27 (×5): 25 mg via ORAL
  Filled 2015-09-23 (×10): qty 1

## 2015-09-23 MED ORDER — MAGNESIUM HYDROXIDE 400 MG/5ML PO SUSP: 30.0000 mL | Freq: Every day | ORAL | Status: DC | PRN

## 2015-09-23 MED ORDER — BUPROPION HCL ER (XL) 150 MG PO TB24
150.0000 mg | ORAL_TABLET | Freq: Every day | ORAL | Status: DC
  Filled 2015-09-23: qty 1
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
## 2015-09-23 NOTE — Progress Notes (Signed)
D:Pt denies SI/HI. Denies AVH. Pt did not attend nursing group on the unit. Pt observed in bed majority of the shift. Pt denies physical pain. Presents with flat affect, depressed mood.   A:Speicla checks q 15 mins in place for safety. Medication administered per MD order (see eMAR). Encouragement and support provided. Pt encouraged to get OOB and be active on the unit.  R:Safety maintained. Compliant with medication regimen. Will continue to monitor.
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
## 2015-09-23 NOTE — [facility] Group Notes (Signed)
[facility] LCSW Group Therapy  09/23/2015 2:29 PM  Type of Therapy:  Group Therapy  Participation Level:  Did Not Attend-Pt invited. Chose to remain in bed.   Modes of Intervention:  Confrontation, Discussion, Education, Exploration, Problem-solving, Rapport Building, Socialization and Support  Summary of Progress/Problems:  Finding Balance in Life. Today's group focused on defining balance in one's own words, identifying things that can knock one off balance, and exploring healthy ways to maintain balance in life. Group members were asked to provide an example of a time when they felt off balance, describe how they handled that situation,and process healthier ways to regain balance in the future. Group members were asked to share the most important tool for maintaining balance that they learned while at [facility] and how they plan to apply this method after discharge.   Smart, Aarib Pulido LCSWA 09/23/2015, 2:29 PM
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
## 2015-09-23 NOTE — [facility] Group Notes (Signed)
Pt. did not attend karaoke.   [name], MHT

## 2015-09-23 NOTE — Plan of Care (Signed)
Problem: Alteration in mood & ability to function due to Goal: STG-Patient will comply with prescribed medication regimen (Patient will comply with prescribed medication regimen)  Outcome: Progressing Pt compliant with prescribed medication regimen.

## 2015-09-23 NOTE — [facility] Group Notes (Signed)
[facility] Group Notes:  (Nursing/MHT/Case Management/Adjunct)  Date:  09/23/2015  Time:0900 Type of Therapy:  Nurse Education  Participation Level:  Did Not Attend    Summary of Progress/Problems:  [name] 09/23/2015, 10:00 AM

## 2015-09-23 NOTE — ED Provider Notes (Signed)
CSN: [id number]     Arrival date & time 09/22/15  2122 History   First MD Initiated Contact with Patient 09/23/15 0103     Chief Complaint  Patient presents with  . Suicidal     (Consider location/radiation/quality/duration/timing/severity/associated sxs/prior Treatment) HPI   Ryan Long is a 54 y.o. male presents for evaluation of suicidal ideation. Thoughts been present for 2 days. He has been thinking about stepping out in front of traffic to kill himself. He was brought here by police. He is homeless, staying at the shelter. Recently the ED for a URI. He is taking his usual medications as prescribed. He denies recent headache, chills, fever, chest pain, shortness of breath, nausea or vomiting. There are no other known modifying factors.   Past Medical History  Diagnosis Date  . HIV (human immunodeficiency virus infection) (HCC)   . Chronic systolic heart failure (HCC)   . Hypertension   . Genital herpes   . Active smoker   . AR (allergic rhinitis)   . HLD (hyperlipidemia)   . NICM (nonischemic cardiomyopathy) (HCC)     Echocardiogram 06/28/11: EF 30-35%, mild MR, mild LAE;  No CAD by coronary CT angiogram 3/12 at [facility]  . NSVT (nonsustained ventricular tachycardia) (HCC)   . Depression   . Bipolar 1 disorder (HCC)   . Alcohol abuse   . Crack cocaine use   . COPD (chronic obstructive pulmonary disease) (HCC)   . CHF (congestive heart failure) (HCC)   . Anxiety   . PTSD (post-traumatic stress disorder)    History reviewed. No pertinent past surgical history. Family History  Problem Relation Age of Onset  . Hypertension Father   . CAD Father    Social History  Substance Use Topics  . Smoking status: Current Every Day Smoker -- 0.00 packs/day for 30 years    Types: Cigarettes  . Smokeless tobacco: Never Used     Comment: 15 cigarettes daily  . Alcohol Use: Yes    Review of Systems  All other systems reviewed and are  negative.     Allergies  Review of patient's allergies indicates no known allergies.  Home Medications   Prior to Admission medications   Medication Sig Start Date End Date Taking? Authorizing Provider  acetaminophen (TYLENOL) 500 MG tablet Take 1,000 mg by mouth every 6 (six) hours as needed for mild pain.   Yes Historical Provider, MD  albuterol (PROVENTIL HFA;VENTOLIN HFA) 108 (90 BASE) MCG/ACT inhaler Inhale 2 puffs into the lungs every 4 (four) hours as needed for shortness of breath. 08/12/15  Yes Shuvon B Rankin, NP  aspirin 81 MG chewable tablet Chew 1 tablet (81 mg total) by mouth every morning. For heart health 08/31/14  Yes Sanjuana Kava, NP  buPROPion (WELLBUTRIN XL) 150 MG 24 hr tablet Take 1 tablet (150 mg total) by mouth daily. 08/16/15  Yes Randall Hiss, MD  digoxin (LANOXIN) 0.125 MG tablet Take 1 tablet (125 mcg total) by mouth daily. For control of heart arrythmia. 08/31/14  Yes Sanjuana Kava, NP  dolutegravir (TIVICAY) 50 MG tablet Take 1 tablet (50 mg total) by mouth daily. 08/16/15  Yes Randall Hiss, MD  emtricitabine-tenofovir AF (DESCOVY) 200-25 MG per tablet Take 1 tablet by mouth daily. 08/16/15  Yes Randall Hiss, MD  hydrOXYzine (ATARAX/VISTARIL) 25 MG tablet Take 1 tablet (25 mg total) by mouth at bedtime as needed for anxiety. 08/16/15  Yes Randall Hiss, MD  metoprolol succinate (TOPROL-XL) 25 MG 24 hr tablet Take 1 tablet (25 mg total) by mouth at bedtime. For high blood pressure 09/14/15  Yes Laurey Morale, MD  Multiple Vitamin (MULTI-VITAMINS) TABS Take 1 tablet by mouth daily. For low vitamin 08/31/14  Yes Sanjuana Kava, NP  potassium chloride (K-DUR,KLOR-CON) 10 MEQ tablet Take 1 tablet (10 mEq total) by mouth as needed (take only when you take lasix). For low potassium 09/09/15  Yes Amy D Clegg, NP  predniSONE (DELTASONE) 20 MG tablet Take 2 tablets (40 mg total) by mouth daily with breakfast. 09/16/15  Yes Gilda Crease, MD   spironolactone (ALDACTONE) 25 MG tablet Take 0.5 tablets (12.5 mg total) by mouth daily. For heart condition 08/18/15  Yes Jaclyn Shaggy, MD  traZODone (DESYREL) 150 MG tablet Take 1 tablet (150 mg total) by mouth at bedtime. 08/16/15  Yes Randall Hiss, MD  valACYclovir (VALTREX) 500 MG tablet Take 1 tablet (500 mg total) by mouth daily. For Herpes. 08/16/15  Yes Randall Hiss, MD  albuterol (PROVENTIL HFA;VENTOLIN HFA) 108 (90 BASE) MCG/ACT inhaler Inhale 2 puffs into the lungs every 4 (four) hours as needed for wheezing or shortness of breath. Patient not taking: Reported on 09/23/2015 09/16/15   Gilda Crease, MD  azithromycin (ZITHROMAX) 250 MG tablet Take 1 tablet (250 mg total) by mouth daily. Take first 2 tablets together, then 1 every day until finished. Patient not taking: Reported on 09/23/2015 09/16/15   Gilda Crease, MD   BP 128/85 mmHg  Pulse 106  Temp(Src) 98.3 F (36.8 C) (Oral)  Resp 18  SpO2 99% Physical Exam  Constitutional: He is oriented to person, place, and time. He appears well-developed and well-nourished. No distress.  HENT:  Head: Normocephalic and atraumatic.  Right Ear: External ear normal.  Left Ear: External ear normal.  Eyes: Conjunctivae and EOM are normal. Pupils are equal, round, and reactive to light.  Neck: Normal range of motion and phonation normal. Neck supple.  Cardiovascular: Normal rate, regular rhythm and normal heart sounds.   Pulmonary/Chest: Effort normal and breath sounds normal. He exhibits no bony tenderness.  Abdominal: Soft. There is no tenderness.  Musculoskeletal: Normal range of motion.  Neurological: He is alert and oriented to person, place, and time. No cranial nerve deficit or sensory deficit. He exhibits normal muscle tone. Coordination normal.  Skin: Skin is warm, dry and intact.  Psychiatric: He has a normal mood and affect. His behavior is normal. Judgment and thought content normal.  Nursing note and  vitals reviewed.   ED Course  Procedures (including critical care time) Medications  acetaminophen (TYLENOL) tablet 650 mg (not administered)  ibuprofen (ADVIL,MOTRIN) tablet 600 mg (not administered)  zolpidem (AMBIEN) tablet 5 mg (not administered)  nicotine (NICODERM CQ - dosed in mg/24 hours) patch 21 mg (not administered)  ondansetron (ZOFRAN) tablet 4 mg (not administered)  alum & mag hydroxide-simeth (MAALOX/MYLANTA) 200-200-20 MG/5ML suspension 30 mL (not administered)  aspirin chewable tablet 81 mg (not administered)  buPROPion (WELLBUTRIN XL) 24 hr tablet 150 mg (not administered)  digoxin (LANOXIN) tablet 125 mcg (not administered)  dolutegravir (TIVICAY) tablet 50 mg (not administered)  emtricitabine-tenofovir AF (DESCOVY) 200-25 MG per tablet 1 tablet (not administered)  hydrOXYzine (ATARAX/VISTARIL) tablet 25 mg (not administered)  metoprolol succinate (TOPROL-XL) 24 hr tablet 25 mg (not administered)  potassium chloride (K-DUR,KLOR-CON) CR tablet 10 mEq (not administered)  spironolactone (ALDACTONE) tablet 12.5 mg (not administered)  traZODone (DESYREL) tablet  150 mg (not administered)  valACYclovir (VALTREX) tablet 500 mg (not administered)  potassium chloride SA (K-DUR,KLOR-CON) CR tablet 40 mEq (not administered)    Patient Vitals for the past 24 hrs:  BP Temp Temp src Pulse Resp SpO2  09/22/15 2333 128/85 mmHg 98.3 F (36.8 C) Oral 106 18 99 %   01:10- TTS consultation  2:29 AM Reevaluation with update and discussion. After initial assessment and treatment, an updated evaluation reveals no change in clinical status. Alysah Carton L    Labs Review Labs Reviewed  COMPREHENSIVE METABOLIC PANEL - Abnormal; Notable for the following:    Potassium 3.0 (*)    Glucose, Bld 178 (*)    Calcium 8.4 (*)    Total Bilirubin 1.8 (*)    All other components within normal limits  ACETAMINOPHEN LEVEL - Abnormal; Notable for the following:    Acetaminophen (Tylenol),  Serum <10 (*)    All other components within normal limits  CBC - Abnormal; Notable for the following:    Hemoglobin 17.7 (*)    All other components within normal limits  ETHANOL  SALICYLATE LEVEL  URINE RAPID DRUG SCREEN, HOSP PERFORMED    Imaging Review No results found. I have personally reviewed and evaluated these images and lab results as part of my medical decision-making.   EKG Interpretation None      MDM   Final diagnoses:  Suicidal ideation  Homelessness    Suicidal ideation, with homelessness. This recurrent problem.  Nursing Notes Reviewed/ Care Coordinated, and agree without changes. Applicable Imaging Reviewed.  Interpretation of Laboratory Data incorporated into ED treatment  Plan- as per TTS in conjunction with oncoming provider team    Mancel Bale, MD 09/23/15 0230

## 2015-09-23 NOTE — [facility] Suicide Risk Assessment (Signed)
[facility] Admission Suicide Risk Assessment   Nursing information obtained from:    Demographic factors:    Current Mental Status:    Loss Factors:    Historical Factors:    Risk Reduction Factors:    Total Time spent with patient: 45 minutes Principal Problem: MDD (major depressive disorder), recurrent episode, severe (HCC) Diagnosis:   Patient Active Problem List   Diagnosis Date Noted  . MDD (major depressive disorder), recurrent episode, severe (HCC) [F33.2] 09/23/2015  . HTN (hypertension) [I10] 09/10/2015  . MDD (major depressive disorder), recurrent, severe, with psychosis (HCC) [F33.3] 08/07/2015  . PTSD (post-traumatic stress disorder) [F43.10] 08/07/2015  . Cocaine use disorder, severe, dependence (HCC) [F14.20] 08/07/2015  . Alcohol use disorder, severe, dependence (HCC) [F10.20] 08/07/2015  . Tobacco use disorder [F17.200] 08/07/2015  . Suicidal ideation [R45.851]   . Polysubstance (excluding opioids) dependence with physiological dependence (HCC) [F19.20] 12/30/2013  . Influenza with other respiratory manifestations [J11.1] 12/22/2013  . Substance induced mood disorder (HCC) [F19.94] 11/22/2013  . Cocaine abuse, episodic use [F14.10] 03/20/2012    Class: Acute  . Alcohol dependence (HCC) [F10.20] 03/20/2012    Class: Acute  . Chronic systolic heart failure (HCC) [I50.22]   . Cardiomyopathy, nonischemic (HCC) [I42.9] 08/24/2011  . Human immunodeficiency virus (HIV) disease (HCC) [B20] 02/01/2009  . HSV [B00.9] 02/01/2009  . HYPERLIPIDEMIA [E78.5] 02/01/2009  . ALLERGIC RHINITIS [J30.9] 02/01/2009     Continued Clinical Symptoms:  Alcohol Use Disorder Identification Test Final Score (AUDIT): 1 The "Alcohol Use Disorders Identification Test", Guidelines for Use in Primary Care, Second Edition.  World Science writer [facility]). Score between 0-7:  no or low risk or alcohol related problems. Score between 8-15:  moderate risk of alcohol related problems. Score between  16-19:  high risk of alcohol related problems. Score 20 or above:  warrants further diagnostic evaluation for alcohol dependence and treatment.   CLINICAL FACTORS:   Depression:   Comorbid alcohol abuse/dependence Severe Alcohol/Substance Abuse/Dependencies   Psychiatric Specialty Exam: Physical Exam  ROS  Blood pressure 110/76, pulse 94, temperature 98.2 F (36.8 C), temperature source Oral, resp. rate 16, height 5\' 9"  (1.753 m), weight 85.73 kg (189 lb).Body mass index is 27.9 kg/(m^2).   COGNITIVE FEATURES THAT CONTRIBUTE TO RISK:  Closed-mindedness, Polarized thinking and Thought constriction (tunnel vision)    SUICIDE RISK:   Moderate:  Frequent suicidal ideation with limited intensity, and duration, some specificity in terms of plans, no associated intent, good self-control, limited dysphoria/symptomatology, some risk factors present, and identifiable protective factors, including available and accessible social support.  PLAN OF CARE: see Admission H and P  Medical Decision Making:  Review of Psycho-Social Stressors (1), Review or order clinical lab tests (1), Review of Medication Regimen & Side Effects (2) and Review of New Medication or Change in Dosage (2)  I certify that inpatient services furnished can reasonably be expected to improve the patient's condition.   Trisha Ken A 09/23/2015, 3:44 PM
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
## 2015-09-24 LAB — BASIC METABOLIC PANEL
ANION GAP: 5 (ref 5–15)
BUN: 19 mg/dL (ref 6–20)
CHLORIDE: 110 mmol/L (ref 101–111)
CO2: 23 mmol/L (ref 22–32)
Calcium: 8.6 mg/dL — ABNORMAL LOW (ref 8.9–10.3)
Creatinine, Ser: 1.2 mg/dL (ref 0.61–1.24)
GFR calc Af Amer: >60 mL/min (ref >60)
Glucose, Bld: 102 mg/dL — ABNORMAL HIGH (ref 65–99)
POTASSIUM: 4.3 mmol/L (ref 3.5–5.1)
SODIUM: 138 mmol/L (ref 135–145)

## 2015-09-24 LAB — LIPID PANEL
CHOL/HDL RATIO: 2.5 ratio
CHOLESTEROL: 145 mg/dL (ref 0–200)
HDL: 57 mg/dL (ref >40)
LDL Cholesterol: 70 mg/dL (ref 0–99)
Triglycerides: 89 mg/dL (ref <150)
VLDL: 18 mg/dL (ref 0–40)

## 2015-09-24 LAB — DIGOXIN LEVEL: DIGOXIN LVL: 0.5 ng/mL — AB (ref 0.8–2.0)

## 2015-09-24 LAB — TSH: TSH: 0.242 u[IU]/mL — AB (ref 0.350–4.500)

## 2015-09-24 MED ORDER — BUPROPION HCL ER (SR) 100 MG PO TB12
100.0000 mg | ORAL_TABLET | Freq: Two times a day (BID) | ORAL | Status: DC
  Filled 2015-09-24 (×5): qty 1

## 2015-09-24 NOTE — [facility] Group Notes (Signed)
[facility] LCSW Group Therapy  09/24/2015 12:08 PM  Type of Therapy:  Group Therapy  Participation Level:  Did Not Attend-pt invited. Chose to remain in bed.   Modes of Intervention:  Confrontation, Discussion, Education, Exploration, Problem-solving, Rapport Building, Socialization and Support  Summary of Progress/Problems: Feelings around Relapse. Group members discussed the meaning of relapse and shared personal stories of relapse, how it affected them and others, and how they perceived themselves during this time. Group members were encouraged to identify triggers, warning signs and coping skills used when facing the possibility of relapse. Social supports were discussed and explored in detail. Post Acute Withdrawal Syndrome (handout provided) was introduced and examined. Pt's were encouraged to ask questions, talk about key points associated with PAWS, and process this information in terms of relapse prevention.   Smart, Ryan Long LCSWA  09/24/2015, 12:08 PM
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
## 2015-09-24 NOTE — Plan of Care (Signed)
Problem: Diagnosis: Increased Risk For Suicide Attempt Goal: STG-Patient Will Comply With Medication Regime Outcome: Progressing Pt complaint with medication regime     

## 2015-09-24 NOTE — Progress Notes (Signed)
Patient ID: Ryan Long, male   DOB: 1961-07-21, 54 y.o.   MRN: [id number] D: Patient alert and cooperative. Pt mood and affect appeared depressed and flat. Pt endorses passive suicidal ideation without a plan and contract to come to staff.  No physical distress noted.   A: Medications administered as prescribed. Emotional support given and will continue to monitor pt's progress for stabilization.  R: Patient remains safe and complaint with medications.

## 2015-09-24 NOTE — Progress Notes (Signed)
Recreation Therapy Notes  Date: 10.07.2016 Time: 9:30am Location: [address] Group room   Group Topic: Stress Management  Goal Area(s) Addresses:  Patient will actively participate in stress management techniques presented during session.   Behavioral Response: Did not attend.   Marykay Lex Latrece Nitta, LRT/CTRS        Ansley Stanwood L 09/24/2015 10:01 AM

## 2015-09-24 NOTE — Progress Notes (Signed)
D: Ryan Long is very annoyed and irritable tonight. He is short with his answers and very minimal with interaction. Earlier tonight he was very loud, argumentative and upset that his roommate has been trying to have a conversation with him and he does not want to "be bothered". He rates his anxiety level as 9/10. He did not attend group. Still isolating. A: Encouragement and support given.  R: Medications administered as prescribed.

## 2015-09-24 NOTE — Plan of Care (Signed)
Problem: Alteration in mood Goal: STG-Patient reports thoughts of self-harm to staff Outcome: Not Progressing Pt endorses passive suicidal ideation without a plan. Pt contracted to come to staff

## 2015-09-24 NOTE — Progress Notes (Signed)
D: Pt continues to be very flat and depressed on the unit today. Pt also continues to be very isolative and has been in the room the whole time. Pt refused all his medication stating that he did not need them. Pt appeared irritable this morning and did not want people in his room. Pt reported that his depression was a 10, his hopelessness was a 10, and that his anxiety was a 10. Pt reported being negative SI/HI, no AH/VH noted. A: 15 min checks continued for patient safety. R: Pts safety maintained.

## 2015-09-24 NOTE — [facility] Group Notes (Signed)
[facility] LCSW Aftercare Discharge Planning Group Note   09/24/2015 11:12 AM  Participation Quality:  Invited-chose not to attend. Pt remained in bed to sleep.   [employer]

## 2015-09-25 DIAGNOSIS — R45851 Suicidal ideations: Secondary | ICD-10-CM

## 2015-09-25 DIAGNOSIS — F332 Major depressive disorder, recurrent severe without psychotic features: Principal | ICD-10-CM

## 2015-09-25 LAB — HEMOGLOBIN A1C
HEMOGLOBIN A1C: 5.3 % (ref 4.8–5.6)
Mean Plasma Glucose: 105 mg/dL

## 2015-09-25 MED ORDER — BUPROPION HCL ER (SR) 150 MG PO TB12
150.0000 mg | ORAL_TABLET | Freq: Two times a day (BID) | ORAL | Status: DC
  Administered 2015-09-26 – 2015-09-28 (×5): 150 mg via ORAL
  Filled 2015-09-25 (×10): qty 1

## 2015-09-25 NOTE — [facility] Group Notes (Signed)
[facility] Group Notes: (Clinical Social Work)   09/25/2015      Type of Therapy:  Group Therapy   Participation Level:  Did Not Attend despite MHT prompting   Kavari Parrillo Grossman-Orr, LCSW 09/25/2015, 11:04 AM     

## 2015-09-25 NOTE — Progress Notes (Signed)
D. Pt has been in the room lying in bed most of the day, pt would only come out to go eat and go back to the room to sleep, pt did not attend the group and refused all his medication. Pt present with very flat affect and depressed mood. Pt reported that his depression was a 0, his hopelessness was a 0, and that his anxiety was a 0. Pt reported being negative SI/HI, no AH/VH noted. A: 15 min checks continued for patient safety. R: Pts safety maintained.

## 2015-09-25 NOTE — Progress Notes (Signed)
The patient attended group but refused to share ("I'll pass").

## 2015-09-25 NOTE — Plan of Care (Signed)
Problem: Alteration in mood & ability to function due to Goal: LTG-Pt verbalizes understanding of importance of med regimen (Patient verbalizes understanding of importance of medication regimen and need to continue outpatient care and support groups)  Outcome: Not Progressing Pt refused to take his medications.
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
## 2015-09-26 MED ORDER — LAMOTRIGINE 25 MG PO TABS
ORAL_TABLET | ORAL | Status: AC
  Filled 2015-09-26: qty 1

## 2015-09-26 MED ORDER — NICOTINE 21 MG/24HR TD PT24
21.0000 mg | MEDICATED_PATCH | Freq: Every day | TRANSDERMAL | Status: DC
  Administered 2015-09-26 – 2015-09-28 (×3): 21 mg via TRANSDERMAL
  Filled 2015-09-26 (×4): qty 1

## 2015-09-26 MED ORDER — LAMOTRIGINE 25 MG PO TABS
25.0000 mg | ORAL_TABLET | Freq: Every day | ORAL | Status: DC
  Administered 2015-09-26 – 2015-09-28 (×3): 25 mg via ORAL
  Filled 2015-09-26 (×4): qty 1

## 2015-09-26 NOTE — Plan of Care (Signed)
Problem: Diagnosis: Increased Risk For Suicide Attempt Goal: STG-Patient Will Comply With Medication Regime Outcome: Progressing Pt was compliant with medications

## 2015-09-26 NOTE — Progress Notes (Signed)
Introduced self to pt and told him I was going to be his nurse for the evening, pt just stated " I don't want you for my nurse". Pt was given to nurse Marisue Ivan.

## 2015-09-26 NOTE — Progress Notes (Signed)
Patient ID: Ryan Long, male   DOB: 06/30/1961, 54 y.o.   MRN: [id number]   D: Pt has been very flat and depressed on the unit today, he did not attend any groups nor did he engage in any treatment. Pt was not as irritable or agitated as yesterday, he took all medications without any problems. Pt reported that he was feeling much better today. Pt reported being negative SI/HI, no AH/VH noted. A: 15 min checks continued for patient safety. R: Pt safety maintained.
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
## 2015-09-26 NOTE — Progress Notes (Signed)
D.  Pt pleasant on approach, complaint of continued anxiety.  Pt states medication is not helping his anxiety and that he plans to speak to the doctor tomorrow about this.  Pt denies SI/HI/halluciinations at this time.  Pt did attend evening AA group, minimal interaction on the unit this evening as Pt is sitting in dayroom watching TV with peers.  A.  Support and encouragement offered, medication given as ordered for anxiety, Pt encouraged to speak to doctor tomorrow about the way he has been feeling for possible medication adjustment.  R.  Pt remains safe on the unit, will continue to monitor.

## 2015-09-26 NOTE — [facility] Group Notes (Signed)
[facility] Group Notes:  (Nursing/MHT/Case Management/Adjunct)  Date:  09/26/2015  Time:  10:36 AM  Type of Therapy:  Psychoeducational Skills  Participation Level:  Did Not Attend  Participation Quality:  Did Not Attend  Affect:  Did Not Attend  Cognitive:  Did Not Attend  Insight:  None  Engagement in Group:  Did Not Attend  Modes of Intervention:  Did Not Attend  Summary of Progress/Problems: Pt did not attend patient self inventory group.   Jacquelyne Balint Shanta 09/26/2015, 10:36 AM

## 2015-09-26 NOTE — [facility] Group Notes (Signed)
[facility] Group Notes: (Clinical Social Work)   09/26/2015      Type of Therapy:  Group Therapy   Participation Level:  Did Not Attend despite MHT prompting   Ambrose Mantle, LCSW 09/26/2015, 11:09 AM

## 2015-09-26 NOTE — Progress Notes (Signed)
The patient attended a few minutes of this evening's A.A. Meeting and then walked out.

## 2015-09-26 NOTE — Plan of Care (Signed)
Problem: Alteration in mood & ability to function due to Goal: STG-Patient will attend groups Outcome: Progressing Pt did attend evening AA group     

## 2015-09-26 NOTE — Progress Notes (Signed)
Pt continues to be mad at the world and continues to be rude and angry,. Pt stated he wanted pt Advocate name and number. Pt did not want to tell writer what the issue was. Pt talked to Clinical research associate for a while, but then got upset stating that we didn't care about him and we are all in it together. Pt stated he was upset at a nurse , so he did not come up to get his medication. Writer tried to explain that the most important thing was him getting his medications , then he could deal with what was going on, but pt got upset and stormed off from the medication window. Pt wants people to respect him, but pt appears to not give the respect that he is demanding. Pt appears to be ok as long as he gets his way, when he does not get what he wants he gets upset . Pt was so upset that he could not even hear that he already had the pt advocate number on the paper the day shift AC gave him earlier. Pt could only focus on what he thought he didn't have or get.

## 2015-09-26 NOTE — [facility] Group Notes (Signed)
[facility] Group Notes:  (Nursing/MHT/Case Management/Adjunct)  Date:  09/26/2015  Time:  11:06 AM  Type of Therapy:  Psychoeducational Skills  Participation Level:  Did Not Attend  Participation Quality:  Did Not Attend  Affect:  Did Not Attend  Cognitive:  Did Not Attend  Insight:  None  Engagement in Group:  Did Not Attend  Modes of Intervention:  Did Not Attend  Summary of Progress/Problems: Pt did not attend patient self inventory group.   Jacquelyne Balint Shanta 09/26/2015, 11:06 AM

## 2015-09-27 MED ORDER — PHENAZOPYRIDINE HCL 200 MG PO TABS
200.0000 mg | ORAL_TABLET | Freq: Three times a day (TID) | ORAL | Status: DC
  Administered 2015-09-27 – 2015-09-28 (×3): 200 mg via ORAL
  Filled 2015-09-27 (×2): qty 1
  Filled 2015-09-27: qty 2
  Filled 2015-09-27 (×2): qty 1
  Filled 2015-09-27: qty 2
  Filled 2015-09-27 (×4): qty 1

## 2015-09-27 NOTE — [facility] Group Notes (Signed)
[facility] LCSW Aftercare Discharge Planning Group Note   09/27/2015 10:25 AM  Participation Quality:  Appropriate   Mood/Affect:  Appropriate  Depression Rating:  5  Anxiety Rating:  2  Thoughts of Suicide:  No Will you contract for safety?   NA  Current AVH:  No  Plan for Discharge/Comments:  Pt reports that he is working with a Advertising account planner in attempt to secure apt. Pt wants to follow-up at [facility] and is not interested in further inpatient treatment. Pleasant, cooperative this morning. Pt [facility] are holding [facility] while at hospital. Pt stated that he would call them today.   Transportation Means: bus  Supports: Lawyer; friend   Counselling psychologist, Oncologist

## 2015-09-27 NOTE — Progress Notes (Signed)
D:Patient alert and oriented x 4. Patient denies pain/SI/HI/AVH. Patient was pleasant during assessment.  A: Staff to monitor Q 15 mins for safety. Encouragement and support offered. Scheduled medications administered per orders. R: Patient remains safe on the unit. Patient attended group tonight. Patient visible on hte unit and interacting with peers. Patient taking administered medications.

## 2015-09-27 NOTE — Progress Notes (Signed)
Ryan Long has been in his room much of the day.  He has attended morning groups.  Minimal interaction with peers.  He reports some suicidal thoughts with no plan or intent.  He is able to contract for safety.  He did not complete is self inventory.  He denies any physical complaints.  He appears to be in no physical distress.  He has taken his medications without difficulty.  He has been pleasant with staff today.  Q 15 minute checks maintained for safety.  Encouraged continued participation in group and unit activities.

## 2015-09-27 NOTE — Progress Notes (Signed)
Adult Psychoeducational Group Note  Date:  09/27/2015 Time:  9:53 PM  Group Topic/Focus:  Wrap-Up Group:   The focus of this group is to help patients review their daily goal of treatment and discuss progress on daily workbooks.  Participation Level:  Did Not Attend   Additional Comments:  Patient was informed about group but decided not to attend. Casilda Carls 09/27/2015, 9:53 PM
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
## 2015-09-27 NOTE — [facility] Group Notes (Signed)
[facility] LCSW Group Therapy  09/27/2015 12:55 PM  Type of Therapy:  Group Therapy  Participation Level:  Active  Participation Quality:  Attentive  Affect:  Appropriate  Cognitive:  Alert and Oriented  Insight:  Engaged  Engagement in Therapy:  Improving  Modes of Intervention:  Confrontation, Discussion, Education, Exploration, Problem-solving, Rapport Building, Socialization and Support  Summary of Progress/Problems: Today's Topic: Overcoming Obstacles. Patients identified one short term goal and potential obstacles in reaching this goal. Patients processed barriers involved in overcoming these obstacles. Patients identified steps necessary for overcoming these obstacles and explored motivation (internal and external) for facing these difficulties head on. Ryan Long was attentive and engaged during today's processing group. He shared that his goal is to "secure housing with this housing program that I'm in" and reports that he is in the process of being placed in an apt. "Once I have a safe place to live, I think things will be a lot better for me." CSW commended Ryan Long on his success in keeping up with medical health issues and encouraged him to follow-up with mental health outpatient providers. He acknowledged the importance of doing this as well.    Smart, Jimma Ortman LCSWA  09/27/2015, 12:55 PM

## 2015-09-28 ENCOUNTER — Encounter (HOSPITAL_COMMUNITY): Payer: Self-pay

## 2015-09-28 MED ORDER — ASPIRIN 81 MG PO CHEW: 81.0000 mg | CHEWABLE_TABLET | Freq: Every morning | ORAL | Status: DC

## 2015-09-28 MED ORDER — AZITHROMYCIN 500 MG PO TABS
1000.0000 mg | ORAL_TABLET | Freq: Once | ORAL | Status: AC
  Administered 2015-09-28: 1000 mg via ORAL
  Filled 2015-09-28: qty 2

## 2015-09-28 MED ORDER — HYDROXYZINE HCL 25 MG PO TABS: 25.0000 mg | ORAL_TABLET | Freq: Every evening | ORAL | Status: DC | PRN

## 2015-09-28 MED ORDER — DOLUTEGRAVIR SODIUM 50 MG PO TABS: 50.0000 mg | ORAL_TABLET | Freq: Every day | ORAL | Status: DC

## 2015-09-28 MED ORDER — EMTRICITABINE-TENOFOVIR AF 200-25 MG PO TABS: 1.0000 | ORAL_TABLET | Freq: Every day | ORAL | Status: DC

## 2015-09-28 MED ORDER — LAMOTRIGINE 25 MG PO TABS: 25.0000 mg | ORAL_TABLET | Freq: Every day | ORAL | Status: DC

## 2015-09-28 MED ORDER — TRAZODONE HCL 150 MG PO TABS: 150.0000 mg | ORAL_TABLET | Freq: Every day | ORAL | Status: DC

## 2015-09-28 MED ORDER — METOPROLOL SUCCINATE ER 25 MG PO TB24: 25.0000 mg | ORAL_TABLET | Freq: Every day | ORAL | Status: DC

## 2015-09-28 MED ORDER — BUPROPION HCL ER (SR) 150 MG PO TB12: 150.0000 mg | ORAL_TABLET | Freq: Two times a day (BID) | ORAL | Status: DC

## 2015-09-28 MED ORDER — CEFTRIAXONE SODIUM 250 MG IJ SOLR
250.0000 mg | Freq: Once | INTRAMUSCULAR | Status: AC
  Administered 2015-09-28: 250 mg via INTRAMUSCULAR
  Filled 2015-09-28: qty 250

## 2015-09-28 MED ORDER — SPIRONOLACTONE 25 MG PO TABS: 12.5000 mg | ORAL_TABLET | Freq: Every day | ORAL | Status: DC

## 2015-09-28 MED ORDER — VALACYCLOVIR HCL 500 MG PO TABS: 500.0000 mg | ORAL_TABLET | Freq: Every day | ORAL | Status: DC
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
## 2015-09-28 NOTE — [facility] Suicide Risk Assessment (Signed)
[facility] INPATIENT:  Family/Significant Other Suicide Prevention Education  Suicide Prevention Education:  Contact Attempts: Janey Greaser (pt's friend) [phone] has been identified by the patient as the family member/significant other with whom the patient will be residing, and identified as the person(s) who will aid the patient in the event of a mental health crisis.  With written consent from the patient, two attempts were made to provide suicide prevention education, prior to and/or following the patient's discharge.  We were unsuccessful in providing suicide prevention education.  A suicide education pamphlet was given to the patient to share with family/significant other.  Date and time of first attempt: 09/27/15 at 3:00PM  Date and time of second attempt: 09/28/15 at 8:30AM (voicemail left requesting call back)  Smart, Lleyton Byers LCSWA  09/28/2015, 8:34 AM

## 2015-09-28 NOTE — Progress Notes (Addendum)
Patient discharged per orders. D/C orders, instructions, follow up appointments, medications, prescriptions, community resources discussed in depth with patient. Discussed heart failure and signs/symptoms that would warrant a phone call to the doctor or a visit to the ED.Time allowed for questions/concerns. Patient stated he had none. Patient belongings returned to patient. Discussed coping mechanisms and support groups/people for patient. Encouraged patient to return to [facility] center if he felt he needed assistance. Patient walked out of the building and onto the street to verify with patient that he knew where the bus stop was. Patient given bus pass.  Asher Muir Chang Tiggs,RN

## 2015-09-28 NOTE — Progress Notes (Signed)
  [facility] Adult Case Management Discharge Plan :  Will you be returning to the same living situation after discharge:  Yes,  returning to [facility] At discharge, do you have transportation home?: Yes,  bus pass in chart Do you have the ability to pay for your medications: Yes,  mental health  Release of information consent forms completed and submitted to medical records by CSW.  Patient to Follow up at: Follow-up Information    Follow up with [facility].   Why:  Walk in between 8am-9am Monday through Friday for hospital follow-up/medication management/assessment for therapy services.    Contact information:   [address], [place] [address] Phone: [phone] Fax: [phone]      Follow up with [facility] and Wellness On 10/04/2015.   Why:  Appt on this date at 2:30PM for hospital follow-up/medication management.    Contact information:   [address] [place], [place] [address] Phone: [phone] Fax: [phone]      Patient denies SI/HI: Yes,  during group/self report    Safety Planning and Suicide Prevention discussed: Yes,  Contact attempts made with pt's friend. SPI pamphlet provided to pt and he was encouraged to share this information with support network, ask questions, and talk about any concerns relating to SPE.  Has patient been referred to the Quitline?: Patient refused referral  Ryan Long, Ryan Long  09/28/2015, 8:37 AM

## 2015-09-28 NOTE — [facility] Group Notes (Signed)
[facility] Group Notes:  (Nursing/MHT/Case Management/Adjunct)  Date:  09/28/2015  Time:  2:14 PM  Type of Therapy:  Nurse Education  Participation Level:  Active  Participation Quality:  Appropriate, Attentive and Sharing  Affect:  Appropriate  Cognitive:  Alert and Appropriate  Insight:  Appropriate  Engagement in Group:  Engaged and Supportive  Modes of Intervention:  Discussion and Education  Summary of Progress/Problems:  Group topic was Recovery.  Discussed setting appropriate/measurable goals.  Ryan Long was very engaged in group.  He reported that his long term goal is to have his car back.  Short term goal is to be able to have an apartment.     [name] 09/28/2015, 2:14 PM
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
## 2015-09-29 LAB — GC/CHLAMYDIA PROBE AMP (~~LOC~~) NOT AT ARMC
CHLAMYDIA, DNA PROBE: NEGATIVE
NEISSERIA GONORRHEA: NEGATIVE

## 2015-10-04 ENCOUNTER — Encounter (HOSPITAL_BASED_OUTPATIENT_CLINIC_OR_DEPARTMENT_OTHER): Payer: Self-pay | Admitting: Clinical

## 2015-10-04 ENCOUNTER — Encounter: Payer: Self-pay | Admitting: Family Medicine

## 2015-10-04 ENCOUNTER — Ambulatory Visit: Payer: Medicaid Other | Attending: Family Medicine | Admitting: Family Medicine

## 2015-10-04 VITALS — BP 127/89 | HR 101 | Temp 98.4°F | Resp 18 | Ht 69.0 in | Wt 197.0 lb

## 2015-10-04 DIAGNOSIS — F419 Anxiety disorder, unspecified: Secondary | ICD-10-CM | POA: Insufficient documentation

## 2015-10-04 DIAGNOSIS — F329 Major depressive disorder, single episode, unspecified: Secondary | ICD-10-CM

## 2015-10-04 DIAGNOSIS — F4310 Post-traumatic stress disorder, unspecified: Secondary | ICD-10-CM | POA: Diagnosis not present

## 2015-10-04 DIAGNOSIS — I509 Heart failure, unspecified: Secondary | ICD-10-CM | POA: Diagnosis not present

## 2015-10-04 DIAGNOSIS — E0789 Other specified disorders of thyroid: Secondary | ICD-10-CM | POA: Insufficient documentation

## 2015-10-04 DIAGNOSIS — J438 Other emphysema: Secondary | ICD-10-CM | POA: Diagnosis not present

## 2015-10-04 DIAGNOSIS — G4700 Insomnia, unspecified: Secondary | ICD-10-CM | POA: Diagnosis not present

## 2015-10-04 DIAGNOSIS — Z8249 Family history of ischemic heart disease and other diseases of the circulatory system: Secondary | ICD-10-CM | POA: Diagnosis not present

## 2015-10-04 DIAGNOSIS — I5022 Chronic systolic (congestive) heart failure: Secondary | ICD-10-CM

## 2015-10-04 DIAGNOSIS — F17200 Nicotine dependence, unspecified, uncomplicated: Secondary | ICD-10-CM

## 2015-10-04 DIAGNOSIS — F32A Depression, unspecified: Secondary | ICD-10-CM

## 2015-10-04 DIAGNOSIS — B20 Human immunodeficiency virus [HIV] disease: Secondary | ICD-10-CM

## 2015-10-04 DIAGNOSIS — J449 Chronic obstructive pulmonary disease, unspecified: Secondary | ICD-10-CM | POA: Insufficient documentation

## 2015-10-04 DIAGNOSIS — E785 Hyperlipidemia, unspecified: Secondary | ICD-10-CM | POA: Insufficient documentation

## 2015-10-04 DIAGNOSIS — J441 Chronic obstructive pulmonary disease with (acute) exacerbation: Secondary | ICD-10-CM | POA: Insufficient documentation

## 2015-10-04 DIAGNOSIS — Z79899 Other long term (current) drug therapy: Secondary | ICD-10-CM | POA: Diagnosis not present

## 2015-10-04 DIAGNOSIS — Z7982 Long term (current) use of aspirin: Secondary | ICD-10-CM | POA: Diagnosis not present

## 2015-10-04 DIAGNOSIS — F17210 Nicotine dependence, cigarettes, uncomplicated: Secondary | ICD-10-CM | POA: Diagnosis not present

## 2015-10-04 DIAGNOSIS — R7989 Other specified abnormal findings of blood chemistry: Secondary | ICD-10-CM

## 2015-10-04 DIAGNOSIS — Z5189 Encounter for other specified aftercare: Secondary | ICD-10-CM | POA: Diagnosis present

## 2015-10-04 DIAGNOSIS — I110 Hypertensive heart disease with heart failure: Secondary | ICD-10-CM | POA: Diagnosis not present

## 2015-10-04 HISTORY — DX: Chronic obstructive pulmonary disease, unspecified: J44.9

## 2015-10-04 MED ORDER — ALBUTEROL SULFATE HFA 108 (90 BASE) MCG/ACT IN AERS: 2.0000 | INHALATION_SPRAY | Freq: Four times a day (QID) | RESPIRATORY_TRACT | Status: DC | PRN

## 2015-10-04 NOTE — Patient Instructions (Signed)

## 2015-10-04 NOTE — Progress Notes (Signed)
Pt's here for suicidal ideation. Pt reports feeling good today. Pt reports he's unable to sleep x50mo although he's taken meds for insomnia every night. Pt requesting rx refills.
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
## 2015-10-04 NOTE — Progress Notes (Signed)
ASSESSMENT: Pt currently experiencing symptoms of depression. Pt needs to f/u with PCP and [facility] and would benefit from supportive counseling regarding coping with symptoms of depression.  Stage of Change: precontemplative  PLAN: 1. F/U with behavioral health consultant in as needed 2. Psychiatric Medications: wellbutrin, atarax, desyrel. 3. Behavioral recommendation(s):   -Consider contacting Mobile Crisis line as needed -Consider being hopeful about finding housing -Consider obtaining a room, while waiting for more permanent housing -Consider reading educational material regarding coping with depression SUBJECTIVE: Pt. referred by Dr Venetia Night for possible suicidal ideation:  Pt. reports the following symptoms/concerns: Pt states that he is not currently suicidal, but that he just got out of the [facility] hospital; his primary concern is finding more permanent housing, as he does not feel safe at the [facility] shelter. Pt is on new BH meds, he is not sleeping well at the shelter, and is thinking about getting a room while waiting for housing to become available. He copes with feeling depressed by "getting high" or using alcohol; admits that this is only a short-term solution. Pt states that he is going to [facility].  Duration of problem: one month waiting for housing Severity: severe  OBJECTIVE: Orientation & Cognition: Oriented x3. Thought processes normal and appropriate to situation. Mood: low. Affect: appropriate Appearance: appropriate Risk of harm to self or others: low risk of harm to self today, no risk of harm to others Substance use: cocaine, alcohol, tobacco Assessments administered: PHQ2: 0  Diagnosis: Depression CPT Code: F32.9 -------------------------------------------- Other(s) present in the room: none  Time spent with patient in exam room: 20 minutes

## 2015-10-07 ENCOUNTER — Encounter (HOSPITAL_COMMUNITY): Payer: Self-pay | Admitting: Emergency Medicine

## 2015-10-07 ENCOUNTER — Inpatient Hospital Stay (HOSPITAL_COMMUNITY): Admit: 2015-10-07 | Payer: Self-pay

## 2015-10-07 ENCOUNTER — Emergency Department (HOSPITAL_COMMUNITY)
Admission: EM | Admit: 2015-10-07 | Discharge: 2015-10-08 | Disposition: A | Discharge status: Other Acute Inpatient Hospital | Payer: 59 | Attending: Emergency Medicine | Admitting: Emergency Medicine

## 2015-10-07 DIAGNOSIS — Z7982 Long term (current) use of aspirin: Secondary | ICD-10-CM | POA: Diagnosis not present

## 2015-10-07 DIAGNOSIS — Z720 Tobacco use: Secondary | ICD-10-CM | POA: Diagnosis not present

## 2015-10-07 DIAGNOSIS — I10 Essential (primary) hypertension: Secondary | ICD-10-CM | POA: Diagnosis not present

## 2015-10-07 DIAGNOSIS — F4310 Post-traumatic stress disorder, unspecified: Secondary | ICD-10-CM | POA: Diagnosis not present

## 2015-10-07 DIAGNOSIS — I5022 Chronic systolic (congestive) heart failure: Secondary | ICD-10-CM | POA: Diagnosis not present

## 2015-10-07 DIAGNOSIS — E785 Hyperlipidemia, unspecified: Secondary | ICD-10-CM | POA: Diagnosis not present

## 2015-10-07 DIAGNOSIS — R45851 Suicidal ideations: Secondary | ICD-10-CM

## 2015-10-07 DIAGNOSIS — F1410 Cocaine abuse, uncomplicated: Secondary | ICD-10-CM | POA: Insufficient documentation

## 2015-10-07 DIAGNOSIS — Z79899 Other long term (current) drug therapy: Secondary | ICD-10-CM | POA: Insufficient documentation

## 2015-10-07 DIAGNOSIS — Z21 Asymptomatic human immunodeficiency virus [HIV] infection status: Secondary | ICD-10-CM | POA: Diagnosis not present

## 2015-10-07 DIAGNOSIS — H538 Other visual disturbances: Secondary | ICD-10-CM | POA: Diagnosis not present

## 2015-10-07 DIAGNOSIS — J449 Chronic obstructive pulmonary disease, unspecified: Secondary | ICD-10-CM | POA: Diagnosis not present

## 2015-10-07 DIAGNOSIS — F419 Anxiety disorder, unspecified: Secondary | ICD-10-CM | POA: Diagnosis not present

## 2015-10-07 DIAGNOSIS — Z8619 Personal history of other infectious and parasitic diseases: Secondary | ICD-10-CM | POA: Insufficient documentation

## 2015-10-07 DIAGNOSIS — F319 Bipolar disorder, unspecified: Secondary | ICD-10-CM | POA: Diagnosis not present

## 2015-10-07 LAB — COMPREHENSIVE METABOLIC PANEL
ALK PHOS: 47 U/L (ref 38–126)
ALT: 34 U/L (ref 17–63)
ANION GAP: 9 (ref 5–15)
AST: 35 U/L (ref 15–41)
Albumin: 4 g/dL (ref 3.5–5.0)
BILIRUBIN TOTAL: 2 mg/dL — AB (ref 0.3–1.2)
BUN: 14 mg/dL (ref 6–20)
CALCIUM: 9.1 mg/dL (ref 8.9–10.3)
CO2: 26 mmol/L (ref 22–32)
CREATININE: 1.09 mg/dL (ref 0.61–1.24)
Chloride: 102 mmol/L (ref 101–111)
GFR calc Af Amer: >60 mL/min (ref >60)
GFR calc non Af Amer: >60 mL/min (ref >60)
GLUCOSE: 77 mg/dL (ref 65–99)
Potassium: 3.6 mmol/L (ref 3.5–5.1)
SODIUM: 137 mmol/L (ref 135–145)
TOTAL PROTEIN: 6.8 g/dL (ref 6.5–8.1)

## 2015-10-07 LAB — SALICYLATE LEVEL: Salicylate Lvl: <4 mg/dL (ref 2.8–30.0)

## 2015-10-07 LAB — RAPID URINE DRUG SCREEN, HOSP PERFORMED
Amphetamines: NOT DETECTED
BARBITURATES: NOT DETECTED
Benzodiazepines: NOT DETECTED
Cocaine: POSITIVE — AB
Opiates: NOT DETECTED
Tetrahydrocannabinol: NOT DETECTED

## 2015-10-07 LAB — CBC
HEMATOCRIT: 49.9 % (ref 39.0–52.0)
HEMOGLOBIN: 17.9 g/dL — AB (ref 13.0–17.0)
MCH: 33.9 pg (ref 26.0–34.0)
MCHC: 35.9 g/dL (ref 30.0–36.0)
MCV: 94.5 fL (ref 78.0–100.0)
Platelets: 177 10*3/uL (ref 150–400)
RBC: 5.28 MIL/uL (ref 4.22–5.81)
RDW: 13 % (ref 11.5–15.5)
WBC: 4.6 10*3/uL (ref 4.0–10.5)

## 2015-10-07 LAB — ETHANOL: Alcohol, Ethyl (B): <5 mg/dL (ref <5)

## 2015-10-07 LAB — ACETAMINOPHEN LEVEL

## 2015-10-07 NOTE — ED Notes (Signed)
Staffing notified for sitter. Pt has changed into paper scrubs and security made aware to come wand.

## 2015-10-07 NOTE — ED Notes (Signed)
Tele psych complete.  Report given to Shanna Cisco, RN

## 2015-10-07 NOTE — Care Management (Signed)
ED CM noted patient to have been just discharged from [facility] on 10/11, attended office visit 10/17 at the [facility], and was referred to [facility] CSW for counseling.  Patient presents today with SI, Positive tox for Cocaine.TTS done recommendation BH Observation, CM spoke with Caryn Bee RN on Pod C patient being transported  to [facility] OBS, awaiting transport.

## 2015-10-07 NOTE — ED Notes (Signed)
Tori from [facility] called and stated that the pt is to be transported over at 2300

## 2015-10-07 NOTE — ED Notes (Signed)
Pt st's he is still hungry,  Another [place] sandwich and coke given to pt.  Sitter remains at bedside

## 2015-10-07 NOTE — BH Assessment (Addendum)
Tele Assessment Note   Ryan Long is an 54 y.o. male. The Pt reports SI with a plan to overdose. Pt denies HI. Pt reports AVH. Pt states "I see black lines all day." Pt was recently D/C from [facility] on 09/23/15 and has been admitted to [facility] many times for depression, SI, and SA. Pt denies current outpatient services. Pt states he did not follow-up with outpatient resources or fill his prescription. Pt reports recent crack cocaine use. Last use reported on 10/07/15. Pt states he is currently homeless. According to the Pt, there has been no changes since his last hospitalization. Pt denies previous abuse. Pt reports numerous physical health concerns.   Writer consulted with Dr. Lolly Mustache. Per Dr. Lolly Mustache Pt meets Obs criteria. Pt accepted to Obs unit.   Diagnosis:  F33.1 MDD  Past Medical History:  Past Medical History  Diagnosis Date  . HIV (human immunodeficiency virus infection) (HCC)   . Chronic systolic heart failure (HCC)   . Hypertension   . Genital herpes   . Active smoker   . AR (allergic rhinitis)   . HLD (hyperlipidemia)   . NICM (nonischemic cardiomyopathy) (HCC)     Echocardiogram 06/28/11: EF 30-35%, mild MR, mild LAE;  No CAD by coronary CT angi[facility] 3/12 at [facility]  . NSVT (nonsustained ventricular tachycardia) (HCC)   . Depression   . Bipolar 1 disorder (HCC)   . Alcohol abuse   . Crack cocaine use   . COPD (chronic obstructive pulmonary disease) (HCC)   . CHF (congestive heart failure) (HCC)   . Anxiety   . PTSD (post-traumatic stress disorder)     History reviewed. No pertinent past surgical history.  Family History:  Family History  Problem Relation Age of Onset  . Hypertension Father   . CAD Father     Social History:  reports that he has been smoking Cigarettes.  He has a 15 pack-year smoking history. He has never used smokeless tobacco. He reports that he does not drink alcohol or use illicit drugs.  Additional Social History:     CIWA:  CIWA-Ar BP: 116/86 mmHg Pulse Rate: 102 COWS:    PATIENT STRENGTHS: (choose at least two) Capable of independent living Communication skills  Allergies: No Known Allergies  Home Medications:  (Not in a hospital admission)  OB/GYN Status:  No LMP for male patient.  General Assessment Data Location of Assessment: [facility] ED TTS Assessment: In system Is this a Tele or Face-to-Face Assessment?: Tele Assessment Is this an Initial Assessment or a Re-assessment for this encounter?: Initial Assessment Marital status: Single Maiden name: NA Is patient pregnant?: No Pregnancy Status: No Living Arrangements: Alone Can pt return to current living arrangement?: Yes Admission Status: Voluntary Is patient capable of signing voluntary admission?: Yes Referral Source: Self/Family/Friend Insurance type: MCD     Crisis Care Plan Living Arrangements: Alone Name of Psychiatrist: NA Name of Therapist: NA  Education Status Is patient currently in school?: No Current Grade: NA Highest grade of school patient has completed: Some college Name of school: NA Contact person: NA  Risk to self with the past 6 months Suicidal Ideation: Yes-Currently Present Has patient been a risk to self within the past 6 months prior to admission? : Yes Suicidal Intent: Yes-Currently Present Has patient had any suicidal intent within the past 6 months prior to admission? : No Is patient at risk for suicide?: Yes Suicidal Plan?: Yes-Currently Present Has patient had any suicidal plan within the past 6  months prior to admission? : Yes Specify Current Suicidal Plan: To overdose Access to Means: Yes Specify Access to Suicidal Means: To overdose What has been your use of drugs/alcohol within the last 12 months?: Crack cocaine use and alcohol use Previous Attempts/Gestures: No How many times?: 0 Other Self Harm Risks: NA Triggers for Past Attempts: Family contact Intentional Self Injurious Behavior: None Family  Suicide History: No Recent stressful life event(s): Other (Comment) (homelessness) Persecutory voices/beliefs?: No Depression: Yes Depression Symptoms: Loss of interest in usual pleasures, Feeling angry/irritable, Feeling worthless/self pity, Guilt, Fatigue, Isolating Substance abuse history and/or treatment for substance abuse?: Yes Suicide prevention information given to non-admitted patients: Not applicable  Risk to Others within the past 6 months Homicidal Ideation: No Does patient have any lifetime risk of violence toward others beyond the six months prior to admission? : No Thoughts of Harm to Others: No Current Homicidal Intent: No Current Homicidal Plan: No Access to Homicidal Means: No Identified Victim: NA History of harm to others?: No Assessment of Violence: None Noted Violent Behavior Description: NA Does patient have access to weapons?: No Criminal Charges Pending?: No Does patient have a court date: No Is patient on probation?: No  Psychosis Hallucinations: None noted Delusions: None noted  Mental Status Report Appearance/Hygiene: Unremarkable Eye Contact: Fair Motor Activity: Freedom of movement Speech: Logical/coherent Level of Consciousness: Alert Mood: Pleasant Affect: Appropriate to circumstance Anxiety Level: None Panic attack frequency: NA Most recent panic attack: NA Thought Processes: Coherent, Relevant Judgement: Unimpaired Orientation: Person, Place, Time, Situation Obsessive Compulsive Thoughts/Behaviors: None  Cognitive Functioning Concentration: Normal Memory: Recent Intact, Remote Intact IQ: Average Insight: Fair Impulse Control: Fair Appetite: Fair Weight Loss: 0 Weight Gain: 0 Sleep: Decreased Total Hours of Sleep: 5 Vegetative Symptoms: None  ADLScreening [facility] Assessment Services) Patient's cognitive ability adequate to safely complete daily activities?: Yes Patient able to express need for assistance with ADLs?:  Yes Independently performs ADLs?: Yes (appropriate for developmental age)  Prior Inpatient Therapy Prior Inpatient Therapy: Yes Prior Therapy Dates: 2013-Present Prior Therapy Facilty/Provider(s): [facility] multiple admits Reason for Treatment: SA, SI  Prior Outpatient Therapy Prior Outpatient Therapy: No Prior Therapy Dates: NA Prior Therapy Facilty/Provider(s): NA Reason for Treatment: N/a Does patient have an ACCT team?: No Does patient have Intensive In-House Services?  : No Does patient have [facility] services? : No Does patient have P4CC services?: No  ADL Screening (condition at time of admission) Patient's cognitive ability adequate to safely complete daily activities?: Yes Patient able to express need for assistance with ADLs?: Yes Independently performs ADLs?: Yes (appropriate for developmental age)             Merchant navy officer (For Healthcare) Does patient have an advance directive?: No    Additional Information 1:1 In Past 12 Months?: No CIRT Risk: No Elopement Risk: No Does patient have medical clearance?: Yes     Disposition:  Disposition Initial Assessment Completed for this Encounter: Yes Disposition of Patient: Other dispositions (Dr. Lolly Mustache recommends OBs) Type of inpatient treatment program: Adult Other disposition(s): Other (Comment)  Jaquana Geiger D 10/07/2015 6:15 PM

## 2015-10-07 NOTE — ED Notes (Signed)
[place] sandwich given to pt.  Sitter at bedside

## 2015-10-07 NOTE — ED Notes (Signed)
Meal tray ordered for patient. Pt to go to [facility] OPS unit after meal tray. Accepting MD is Dr Lucianne Muss and Elita Quick RN is the accepting nurse.

## 2015-10-07 NOTE — ED Notes (Signed)
Warm blanket given to pt

## 2015-10-07 NOTE — ED Notes (Signed)
Pt sts that he has been feeling depressed on Monday and now feeling like he wants to take a bunch of sleeping pills. Pt also reports seeing black lines in his vision. Denies HI. Denies drug or alcohol use today.

## 2015-10-07 NOTE — ED Provider Notes (Signed)
CSN: [id number]     Arrival date & time 10/07/15  1447 History   First MD Initiated Contact with Patient 10/07/15 1531     Chief Complaint  Patient presents with  . Suicidal    Patient is a 54 y.o. male presenting with mental health disorder. The history is provided by the patient and medical records.  Mental Health Problem Presenting symptoms: suicidal thoughts   Presenting symptoms: no hallucinations, no homicidal ideas and no self mutilation   Degree of incapacity (severity):  Moderate Onset quality:  Gradual Duration:  4 days Timing:  Constant Progression:  Worsening Chronicity:  Recurrent Context: alcohol use, drug abuse, noncompliance (psych meds were changed during recent psych admission a couple wks ago- has not made this change and reports only using his prior regimen ) and recent medication change   Context: not stressful life event   Worsened by:  Alcohol and drugs (used large amt of ETOH and crack yesterday) Associated symptoms: no abdominal pain and no chest pain   Associated symptoms comment:  Intermittent black lines in vision x months, worse with standing, not present currently Risk factors: hx of mental illness and recent psychiatric admission     Past Medical History  Diagnosis Date  . HIV (human immunodeficiency virus infection) (HCC)   . Chronic systolic heart failure (HCC)   . Hypertension   . Genital herpes   . Active smoker   . AR (allergic rhinitis)   . HLD (hyperlipidemia)   . NICM (nonischemic cardiomyopathy) (HCC)     Echocardiogram 06/28/11: EF 30-35%, mild MR, mild LAE;  No CAD by coronary CT angiogram 3/12 at [facility]  . NSVT (nonsustained ventricular tachycardia) (HCC)   . Depression   . Bipolar 1 disorder (HCC)   . Alcohol abuse   . Crack cocaine use   . COPD (chronic obstructive pulmonary disease) (HCC)   . CHF (congestive heart failure) (HCC)   . Anxiety   . PTSD (post-traumatic stress disorder)    History reviewed. No  pertinent past surgical history. Family History  Problem Relation Age of Onset  . Hypertension Father   . CAD Father    Social History  Substance Use Topics  . Smoking status: Current Every Day Smoker -- 0.50 packs/day for 30 years    Types: Cigarettes  . Smokeless tobacco: Never Used     Comment: 15 cigarettes daily  . Alcohol Use: No    Review of Systems  Constitutional: Negative for fever.  HENT: Negative for rhinorrhea.   Eyes: Positive for visual disturbance.  Respiratory: Negative for shortness of breath.   Cardiovascular: Negative for chest pain.  Gastrointestinal: Negative for vomiting and abdominal pain.  Genitourinary: Negative for decreased urine volume.  Skin: Negative for rash.  Allergic/Immunologic: Negative for immunocompromised state.  Neurological: Negative for syncope.  Psychiatric/Behavioral: Positive for suicidal ideas. Negative for homicidal ideas, hallucinations, confusion and self-injury.      Allergies  Review of patient's allergies indicates no known allergies.  Home Medications   Prior to Admission medications   Medication Sig Start Date End Date Taking? Authorizing Provider  albuterol (PROVENTIL HFA;VENTOLIN HFA) 108 (90 BASE) MCG/ACT inhaler Inhale 2 puffs into the lungs every 6 (six) hours as needed for wheezing or shortness of breath. 10/04/15  Yes Jaclyn Shaggy, MD  aspirin 81 MG chewable tablet Chew 1 tablet (81 mg total) by mouth every morning. For heart health 09/28/15  Yes Adonis Brook, NP  buPROPion [facility] SR) 150 MG 12  hr tablet Take 1 tablet (150 mg total) by mouth 2 (two) times daily. Patient taking differently: Take 150 mg by mouth daily.  09/28/15  Yes Adonis Brook, NP  digoxin (LANOXIN) 0.125 MG tablet Take 1 tablet (125 mcg total) by mouth daily. For control of heart arrythmia. 08/31/14  Yes Sanjuana Kava, NP  dolutegravir (TIVICAY) 50 MG tablet Take 1 tablet (50 mg total) by mouth daily. 09/28/15  Yes Adonis Brook, NP   emtricitabine-tenofovir AF (DESCOVY) 200-25 MG tablet Take 1 tablet by mouth daily. 09/28/15  Yes Adonis Brook, NP  hydrOXYzine (ATARAX/VISTARIL) 25 MG tablet Take 1 tablet (25 mg total) by mouth at bedtime as needed for anxiety. 09/28/15  Yes Adonis Brook, NP  lamoTRIgine (LAMICTAL) 25 MG tablet Take 1 tablet (25 mg total) by mouth daily. 09/28/15  Yes Adonis Brook, NP  metoprolol succinate (TOPROL-XL) 25 MG 24 hr tablet Take 1 tablet (25 mg total) by mouth at bedtime. For high blood pressure 09/28/15  Yes Adonis Brook, NP  spironolactone (ALDACTONE) 25 MG tablet Take 0.5 tablets (12.5 mg total) by mouth daily. For heart condition 09/28/15  Yes Adonis Brook, NP  traZODone (DESYREL) 150 MG tablet Take 1 tablet (150 mg total) by mouth at bedtime. 09/28/15  Yes Adonis Brook, NP  valACYclovir (VALTREX) 500 MG tablet Take 1 tablet (500 mg total) by mouth daily. For Herpes. 09/28/15  Yes Adonis Brook, NP   BP 116/86 mmHg  Pulse 102  Temp(Src) 98.4 F (36.9 C) (Oral)  Resp 18  Ht  (1.753 m)  Wt 195 lb (88.451 kg)  BMI 28.78 kg/m2  SpO2 96% Physical Exam  Constitutional: He is oriented to person, place, and time. He appears well-developed and well-nourished. No distress.  HENT:  Head: Normocephalic and atraumatic.  Eyes: Pupils are equal, round, and reactive to light. Right eye exhibits no discharge. Left eye exhibits no discharge.  VA 20/50 each eye, 20/40 both- does not have glasses  Neck: No tracheal deviation present.  Cardiovascular: Normal rate and regular rhythm.   Pulmonary/Chest: Effort normal and breath sounds normal. No respiratory distress.  Abdominal: Soft. He exhibits no distension. There is no tenderness.  Musculoskeletal: He exhibits no edema.  Neurological: He is alert and oriented to person, place, and time.  Skin: Skin is warm and dry.  Psychiatric: He has a normal mood and affect. His behavior is normal.  Endorses SI- plan to OD on "sleeping pills".  Denies HI or hallucinations currently.  Nursing note and vitals reviewed.   ED Course  Procedures (including critical care time) Labs Review Labs Reviewed  COMPREHENSIVE METABOLIC PANEL - Abnormal; Notable for the following:    Total Bilirubin 2.0 (*)    All other components within normal limits  ACETAMINOPHEN LEVEL - Abnormal; Notable for the following:    Acetaminophen (Tylenol), Serum <10 (*)    All other components within normal limits  CBC - Abnormal; Notable for the following:    Hemoglobin 17.9 (*)    All other components within normal limits  URINE RAPID DRUG SCREEN, HOSP PERFORMED - Abnormal; Notable for the following:    Cocaine POSITIVE (*)    All other components within normal limits  ETHANOL  SALICYLATE LEVEL    Imaging Review No results found. I have personally reviewed and evaluated these images and lab results as part of my medical decision-making.   EKG Interpretation None      MDM   Final diagnoses:  Suicidal ideation  53 yo M with hx homelessness, HIV, cardiomyopathy, bipolar disorder, polysubstance and ETOH abuse, COPD, CHF, anxiety, PTSD presenting with SI as above. Also reports intermittent vision changes with standing, described as lines across vision that come and go- though these are not present now and have been coming and going for months; feel acute vision threatening emergency such as CVA / art or vein occlusion / detachment or hemorrhage unlikely under these circumstances and will defer management to outpt basis. Does not appear intoxicated currently though states last used large amt ETOH and crack yesterday. AF,VSS, well appearing, no evidence of concerning acute underlying organic disorder on hx/exam/labs. Placed in behavioral holding area with CIWA protocol and sitter while awaiting Psych dispo. Discharged for transfer to outside facility for further psych care.   Case discussed with Dr. Dalene Seltzer, who oversaw management of this  patient.    Urban Gibson, MD 10/11/15 1429  Alvira Monday, MD 10/12/15 1351

## 2015-10-08 ENCOUNTER — Observation Stay (HOSPITAL_COMMUNITY)
Admission: AD | Admit: 2015-10-08 | Discharge: 2015-10-10 | Disposition: A | Discharge status: Home / Self Care | Payer: MEDICAID | Source: Intra-hospital | Attending: Psychiatry | Admitting: Psychiatry

## 2015-10-08 ENCOUNTER — Encounter (HOSPITAL_COMMUNITY): Payer: Self-pay

## 2015-10-08 DIAGNOSIS — F1020 Alcohol dependence, uncomplicated: Secondary | ICD-10-CM | POA: Diagnosis not present

## 2015-10-08 DIAGNOSIS — F17210 Nicotine dependence, cigarettes, uncomplicated: Secondary | ICD-10-CM | POA: Diagnosis not present

## 2015-10-08 DIAGNOSIS — J449 Chronic obstructive pulmonary disease, unspecified: Secondary | ICD-10-CM | POA: Diagnosis not present

## 2015-10-08 DIAGNOSIS — F339 Major depressive disorder, recurrent, unspecified: Principal | ICD-10-CM | POA: Diagnosis present

## 2015-10-08 DIAGNOSIS — R45851 Suicidal ideations: Secondary | ICD-10-CM | POA: Insufficient documentation

## 2015-10-08 DIAGNOSIS — F4310 Post-traumatic stress disorder, unspecified: Secondary | ICD-10-CM | POA: Diagnosis not present

## 2015-10-08 DIAGNOSIS — E785 Hyperlipidemia, unspecified: Secondary | ICD-10-CM | POA: Insufficient documentation

## 2015-10-08 DIAGNOSIS — F332 Major depressive disorder, recurrent severe without psychotic features: Secondary | ICD-10-CM | POA: Diagnosis not present

## 2015-10-08 DIAGNOSIS — Z21 Asymptomatic human immunodeficiency virus [HIV] infection status: Secondary | ICD-10-CM | POA: Diagnosis not present

## 2015-10-08 DIAGNOSIS — I110 Hypertensive heart disease with heart failure: Secondary | ICD-10-CM | POA: Diagnosis not present

## 2015-10-08 DIAGNOSIS — I429 Cardiomyopathy, unspecified: Secondary | ICD-10-CM | POA: Diagnosis not present

## 2015-10-08 DIAGNOSIS — F419 Anxiety disorder, unspecified: Secondary | ICD-10-CM | POA: Diagnosis not present

## 2015-10-08 DIAGNOSIS — I5022 Chronic systolic (congestive) heart failure: Secondary | ICD-10-CM | POA: Insufficient documentation

## 2015-10-08 DIAGNOSIS — F1420 Cocaine dependence, uncomplicated: Secondary | ICD-10-CM | POA: Insufficient documentation

## 2015-10-08 MED ORDER — EMTRICITABINE-TENOFOVIR AF 200-25 MG PO TABS
1.0000 | ORAL_TABLET | Freq: Every day | ORAL | Status: DC
  Administered 2015-10-08 – 2015-10-10 (×3): 1 via ORAL
  Filled 2015-10-08 (×5): qty 1

## 2015-10-08 MED ORDER — TRAZODONE HCL 50 MG PO TABS: 50.0000 mg | ORAL_TABLET | Freq: Every evening | ORAL | Status: DC | PRN

## 2015-10-08 MED ORDER — LAMOTRIGINE 25 MG PO TABS
25.0000 mg | ORAL_TABLET | Freq: Every day | ORAL | Status: DC
  Administered 2015-10-08 – 2015-10-09 (×2): 25 mg via ORAL
  Filled 2015-10-08 (×2): qty 1

## 2015-10-08 MED ORDER — TRAZODONE HCL 150 MG PO TABS
150.0000 mg | ORAL_TABLET | Freq: Every day | ORAL | Status: DC
  Administered 2015-10-08 – 2015-10-09 (×2): 150 mg via ORAL
  Filled 2015-10-08 (×2): qty 1

## 2015-10-08 MED ORDER — NICOTINE 21 MG/24HR TD PT24
21.0000 mg | MEDICATED_PATCH | Freq: Every day | TRANSDERMAL | Status: DC
  Administered 2015-10-08 – 2015-10-10 (×3): 21 mg via TRANSDERMAL
  Filled 2015-10-08 (×3): qty 1

## 2015-10-08 MED ORDER — SPIRONOLACTONE 12.5 MG HALF TABLET
12.5000 mg | ORAL_TABLET | Freq: Every day | ORAL | Status: DC
  Administered 2015-10-08 – 2015-10-10 (×3): 12.5 mg via ORAL
  Filled 2015-10-08 (×5): qty 1

## 2015-10-08 MED ORDER — ALUM & MAG HYDROXIDE-SIMETH 200-200-20 MG/5ML PO SUSP: 30.0000 mL | ORAL | Status: DC | PRN

## 2015-10-08 MED ORDER — ALBUTEROL SULFATE HFA 108 (90 BASE) MCG/ACT IN AERS: 2.0000 | INHALATION_SPRAY | Freq: Four times a day (QID) | RESPIRATORY_TRACT | Status: DC | PRN

## 2015-10-08 MED ORDER — METOPROLOL SUCCINATE ER 25 MG PO TB24
25.0000 mg | ORAL_TABLET | Freq: Every day | ORAL | Status: DC
  Administered 2015-10-08 – 2015-10-09 (×2): 25 mg via ORAL
  Filled 2015-10-08 (×2): qty 1

## 2015-10-08 MED ORDER — VALACYCLOVIR HCL 500 MG PO TABS
500.0000 mg | ORAL_TABLET | Freq: Every day | ORAL | Status: DC
Start: 2015-10-08 — End: 2015-10-10
  Administered 2015-10-08 – 2015-10-10 (×3): 500 mg via ORAL
  Filled 2015-10-08 (×3): qty 1

## 2015-10-08 MED ORDER — MAGNESIUM HYDROXIDE 400 MG/5ML PO SUSP: 30.0000 mL | Freq: Every day | ORAL | Status: DC | PRN

## 2015-10-08 MED ORDER — ACETAMINOPHEN 325 MG PO TABS: 650.0000 mg | ORAL_TABLET | Freq: Four times a day (QID) | ORAL | Status: DC | PRN

## 2015-10-08 MED ORDER — BUPROPION HCL ER (SR) 150 MG PO TB12
150.0000 mg | ORAL_TABLET | Freq: Two times a day (BID) | ORAL | Status: DC
  Administered 2015-10-08 – 2015-10-10 (×5): 150 mg via ORAL
  Filled 2015-10-08: qty 18
  Filled 2015-10-08 (×5): qty 1

## 2015-10-08 MED ORDER — DOLUTEGRAVIR SODIUM 50 MG PO TABS
50.0000 mg | ORAL_TABLET | Freq: Every day | ORAL | Status: DC
  Administered 2015-10-08 – 2015-10-10 (×3): 50 mg via ORAL
  Filled 2015-10-08 (×5): qty 1

## 2015-10-08 MED ORDER — DIGOXIN 125 MCG PO TABS
125.0000 ug | ORAL_TABLET | Freq: Every day | ORAL | Status: DC
  Administered 2015-10-08 – 2015-10-10 (×3): 125 ug via ORAL
  Filled 2015-10-08 (×6): qty 1

## 2015-10-08 MED ORDER — HYDROXYZINE HCL 25 MG PO TABS
25.0000 mg | ORAL_TABLET | Freq: Every evening | ORAL | Status: DC | PRN
  Administered 2015-10-08 – 2015-10-09 (×2): 25 mg via ORAL
  Filled 2015-10-08 (×2): qty 1

## 2015-10-08 MED ORDER — ASPIRIN 81 MG PO CHEW
81.0000 mg | CHEWABLE_TABLET | Freq: Every morning | ORAL | Status: DC
  Administered 2015-10-08 – 2015-10-10 (×3): 81 mg via ORAL
  Filled 2015-10-08 (×3): qty 1
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
## 2015-10-08 NOTE — Plan of Care (Signed)
[facility] Observation Crisis Plan  Reason for Crisis Plan:  Chronic Mental Illness/Medical Illness and Substance Abuse   Plan of Care:  Referral for Substance Abuse  Family Support:      Current Living Environment:  Living Arrangements: Other (Comment) [employer])  Insurance:   Hospital Account    Name Acct ID Class Status Primary Coverage   Ryan Long, Ryan Long [id number] BEHAVIORAL HEALTH OBSERVATION Open GENERIC LME - GENERIC LME        Guarantor Account (for Hospital Account [id number])    Name Relation to Pt Service Area Active? Acct Type   [name] Self [facility] Yes Behavioral Health   Address Phone       [address] [place], [place] [address] [phone](H)          Coverage Information (for Hospital Account [id number])    1. GENERIC LME/GENERIC LME    F/O Payor/Plan Precert #   GENERIC LME/GENERIC LME    Subscriber Subscriber #   Ryan Long [id number] [facility]   Address Phone   [address] [place], [place] [address] [phone]       2. GENERIC LME/GENERIC LME    F/O Payor/Plan Precert #   GENERIC LME/GENERIC LME    Subscriber Subscriber #   Ryan Long [id number] [facility]   Address Phone   [address] [place], [place] [address] [phone]       3. GENERIC LME/GENERIC LME    F/O Payor/Plan Precert #   GENERIC LME/GENERIC LME    Subscriber Subscriber #   Ryan Long [id number] [facility]   Address Phone   [address] [place], [place] [address] [phone]       4. [facility] CENTER FOR MH/DD/SAS/[place] SPONSORSHIP    F/O Payor/Plan Precert #   [facility] FOR MH/DD/SAS/[place] SPONSORSHIP    Subscriber Subscriber #   Ryan Long [id number] O   Address Phone   [address] [place], [place] [address] [phone]       5. [employer]ICA[employer]    F/O Payor/Plan Precert #   [employer] [employer]ICAID    Subscriber Subscriber #   Ryan Long [id number] [facility]   Address  Phone   [address] [place], [place] [address] [phone]       6. [place]    F/O Payor/Plan Precert #   [address] MEDICAID/[place]    Subscriber Subscriber #   Ryan Long [id number] O   Address Phone   [address] [place], [place] [address] [phone]          Legal Guardian:     Primary Care Provider:  Lora Paula, MD  Current Outpatient Providers:  Dr. Armen Pickup  Psychiatrist:     Counselor/Therapist:     Compliant with Medications:  Yes  Additional Information:   [name] 10/21/20163:26 AM

## 2015-10-08 NOTE — Progress Notes (Signed)
Nursing Shift Assessment:  Patient cooperative, mostly quiet with minimal interaction. Patient denies any HI and/or AVH but admits to passive SI. Patient compliant with medications, ate about 80% of breakfast, mostly sleeping. Nurse administering medications as ordered and providing therapeutic emotional support. Q15 minute checks for safety continuous. Patient contracting for safety on the Unit and remains safe on Unit.

## 2015-10-08 NOTE — [facility] Counselor (Signed)
This Probation officer met with pt at bedside to discuss reasons for admission. Pt shared that he was recently discharged from the adult inpatient unit and he did not follow up with recommendations or pick up prescriptions. Pt also reports that there has been no changes since his last hospitalization and he is currently staying at the [facility] with hopes of obtaining an apartment soon. Pt reports symptoms of depression and along with SI and continued substance abuse. Pt also reports that he wants to get the help that he needs this time around in hopes that he will gain some stability. This Probation officer provided pt with encouragement and advised him that he would be seen by the A.M. extender in regards to disposition.  This Probation officer will continue to provide pt with support and encouragement throughout the remainder of the shift.  Redmond Pulling, MA OBS Unit

## 2015-10-08 NOTE — Progress Notes (Signed)
Pt. presents to [facility] reporting SI with plan to OD on his medications.  Pt. Has history of depression, GAD, HIV, MDD, bipolar d/o, genital herpes, ETOH abuse, cocaine abuse, HTN, chonic systolic heart failure, and CHF.  Pt. Is a smoker.  Pt. Reports to writer that he drinks 6 + beers  and unknown amt. Of cocaine approximately 3 times a month. Pt. States he hears voices telling him to hurt self and sees black lines coming at him.  Pt. Appears to be a poor historian d/t noted differences in his responses.   Pt. Was just discharged from [facility] on 09/23/2015 and has multiple admits to [facility] for depression, SI and substance abuse.  Pt. States he is presently staying at the [facility] but is hoping to get an apartment soon.  Pt. States he hopes to get better at [facility], find stability and his medicine.  Pt. Is calm and cooperative, is offered food and fluids and escorted to the Obs Unit.

## 2015-10-08 NOTE — Progress Notes (Signed)
[facility] INPATIENT:  Family/Significant Other Suicide Prevention Education  Suicide Prevention Education:  Patient Refusal for Family/Significant Other Suicide Prevention Education: The patient Ryan Long has refused to provide written consent for family/significant other to be provided Family/Significant Other Suicide Prevention Education during admission and/or prior to discharge.  Physician notified.  [name] 10/08/2015, 5:44 AM

## 2015-10-09 DIAGNOSIS — F332 Major depressive disorder, recurrent severe without psychotic features: Secondary | ICD-10-CM | POA: Diagnosis not present

## 2015-10-09 DIAGNOSIS — R45851 Suicidal ideations: Secondary | ICD-10-CM | POA: Diagnosis not present

## 2015-10-09 DIAGNOSIS — F339 Major depressive disorder, recurrent, unspecified: Secondary | ICD-10-CM | POA: Diagnosis not present

## 2015-10-09 MED ORDER — LAMOTRIGINE 25 MG PO TABS
25.0000 mg | ORAL_TABLET | Freq: Two times a day (BID) | ORAL | Status: DC
  Administered 2015-10-09 – 2015-10-10 (×2): 25 mg via ORAL
  Filled 2015-10-09 (×2): qty 1
  Filled 2015-10-09: qty 18

## 2015-10-09 NOTE — Clinical Social Work Note (Signed)
CSW met with pt to discuss aftercare. Pt given multiple resources including: [facility]S pamphlet, IRC information, [employer], Boarding house list, extended stay hotel list, SPE packet/mobile crisis packet, and Mental Health Association information. Pt and CSW discussed the importance in pt following up with outpatient providers for med management and SAIOP/individual counseling. Pt verbalized understanding of above information and stated that he would go to [facility] on Tuesday between 9am-12pm (triage hours) for assessment.  Maxie Better, LCSWA Clinical Social Worker 10/09/2015 1:55 PM

## 2015-10-09 NOTE — Progress Notes (Signed)
D: Patient resting quietly in bed. Continues to endorse SI without a plan.  A: Patient encouraged to continue with the treatment plan and verbalize needs to staff. Due meds given as ordered. Every 15 minutes check maintained for safety. Will continue to monitor patient for safety and stability.  R: Patient remains safe and appropriate.

## 2015-10-09 NOTE — Progress Notes (Signed)
D: Patient awake for medications.  Verbalizes SI with plan to step in front of a train.  Patient has minimal withdrawal symptoms.  He is not on protocol at this time.  Patient denies HI/AVH.  He presents with flat affect and sad, depressed mood.  He is cooperative and calm. A: Continue to monitor medication management and provider's orders.  Safety checks completed every 15 minutes per protocol.  Offer encouragement and support as needed. R: No disposition regarding discharge.  Awaiting provider and counselor recommendations.

## 2015-10-09 NOTE — BHH Counselor (Signed)
Pt. Became disgruntled and upset w/ shift nurse. This Clinical research associate asked if he needed anything and pt. Expressed he needed the t.v. louder and later something to drink. I expressed that we have snack times and some restrictions on diets so I would ask nurse. Afterward this Clinical research associate became disgruntled, Conservation officer, nature did give pt. Something to drink as requested. Had to call for help to de-escalated the situation as patient started banging on door and requesting to talk to supervisor. Staff arrived situation was de-escalated.  Seanna Sisler K. Sherlon Handing, LPC-A, [facility]  Counselor 10/09/2015 9:39 PM

## 2015-10-09 NOTE — Progress Notes (Signed)
[facility] Progress Note  10/09/2015 12:07 PM Ryan Long  MRN:  [id number] Subjective:   Patient states "I slept better. My mood is still not good. I tell you that I am not ready to leave here. I have a bed waiting at [facility] but still feel very suicidal. If I left here I might overdose on some pills or walk right in front of a car. If I can't stay here then send me to another hospital."  Objective:  Patient seen and chart is reviewed. The patient continues to report ongoing depressive symptoms such as sadness, low motivation, poor energy and suicidal ideation. Ryan Long reports that his sleep has improved some along with his appetite. The patient is unable to contract for his safety today if he were to leave the hospital. After recently being discharged from the adult 09/28/2015 the patient reports that he was not able to afford his three dollar co-pay. Ryan Long became mildly agitated when this topic was explored stating "I told you I had a $500 dollar fine to pay in [place]." The social worker is checking to see if his bed at the shelter can continue to be held while the patient continues treatment. The patient reports that he will get his Disability check the beginning of next month. Plan to check about providing medication samples to the patient until he can afford the co-pay to prevent worsening of his depression as recently happened after being discharged from the hospital.   Principal Problem: Major depressive disorder, recurrent ([place]) Diagnosis:   Patient Active Problem List   Diagnosis Date Noted  . Major depressive disorder, recurrent ([place]) [F33.9] 10/08/2015  . COPD (chronic obstructive pulmonary disease) ([place]) [J44.9] 10/04/2015  . Abnormal thyroid stimulating hormone (TSH) level [R79.89] 10/04/2015  . Severe episode of recurrent major depressive disorder, without psychotic features ([place]) [F33.2]   . MDD (major depressive disorder), recurrent episode, severe ([place])  [F33.2] 09/23/2015  . HTN (hypertension) [I10] 09/10/2015  . MDD (major depressive disorder), recurrent, severe, with psychosis ([place]) [F33.3] 08/07/2015  . PTSD (post-traumatic stress disorder) [F43.10] 08/07/2015  . Cocaine use disorder, severe, dependence ([place]) [F14.20] 08/07/2015  . Alcohol use disorder, severe, dependence ([place]) [F10.20] 08/07/2015  . Tobacco use disorder [F17.200] 08/07/2015  . Suicidal ideation [R45.851]   . Polysubstance (excluding opioids) dependence with physiological dependence ([place]) [F19.20] 12/30/2013  . Influenza with other respiratory manifestations [J11.1] 12/22/2013  . Substance induced mood disorder ([place]) [F19.94] 11/22/2013  . Cocaine abuse, episodic use [F14.10] 03/20/2012    Class: Acute  . Alcohol dependence ([place]) [F10.20] 03/20/2012    Class: Acute  . Chronic systolic heart failure ([place]) [I50.22]   . Cardiomyopathy, nonischemic ([place]) [I42.9] 08/24/2011  . Human immunodeficiency virus (HIV) disease ([place]) [B20] 02/01/2009  . HSV [B00.9] 02/01/2009  . HYPERLIPIDEMIA [E78.5] 02/01/2009  . ALLERGIC RHINITIS [J30.9] 02/01/2009   Total Time spent with patient: 30 minutes   Past Medical History:  Past Medical History  Diagnosis Date  . HIV (human immunodeficiency virus infection) ([place])   . Chronic systolic heart failure ([place])   . Hypertension   . Genital herpes   . Active smoker   . AR (allergic rhinitis)   . HLD (hyperlipidemia)   . NICM (nonischemic cardiomyopathy) ([place])     Echocardiogram 06/28/11: EF 30-35%, mild MR, mild LAE;  No CAD by coronary CT angiogram 3/12 at [facility]  . NSVT (nonsustained ventricular tachycardia) ([place])   . Depression   . Bipolar 1 disorder ([place])   .  Alcohol abuse   . Crack cocaine use   . COPD (chronic obstructive pulmonary disease) ([place])   . CHF (congestive heart failure) ([place])   . Anxiety   . PTSD (post-traumatic stress disorder)     Past Surgical History  Procedure Laterality Date  . No  past surgeries     Family History:  Family History  Problem Relation Age of Onset  . Hypertension Father   . CAD Father    Social History:  History  Alcohol Use  . 3.6 oz/week  . 6 Cans of beer per week    Comment: reports drinks 6+ beers 3 times a month     History  Drug Use  . Yes  . Special: Cocaine    Comment: reports uses crack cocaine 3 times a month    Social History   Social History  . Marital Status: Single    Spouse Name: N/A  . Number of Children: N/A  . Years of Education: N/A   Social History Main Topics  . Smoking status: Current Every Day Smoker -- 0.50 packs/day for 30 years    Types: Cigarettes  . Smokeless tobacco: Never Used     Comment: 15 cigarettes daily  . Alcohol Use: 3.6 oz/week    6 Cans of beer per week     Comment: reports drinks 6+ beers 3 times a month  . Drug Use: Yes    Special: Cocaine     Comment: reports uses crack cocaine 3 times a month  . Sexual Activity: Yes    Birth Control/ Protection: Condom     Comment: pt. given condoms   Other Topics Concern  . None   Social History Narrative   Additional Social History:    Pain Medications: denies abuse Prescriptions: no abuse/ see PTA for med list Over the Counter: denies abuse  History of alcohol / drug use?: Yes Longest period of sobriety (when/how long): while in rehab Negative Consequences of Use: Financial, Personal relationships Withdrawal Symptoms: Irritability, Tremors Name of Substance 1: ETOH 1 - Age of First Use: unknown 1 - Amount (size/oz): 6 plus beers 1 - Frequency: 3 times a month 1 - Duration: ongoing 1 - Last Use / Amount: Oct. 2016/unsure of day Name of Substance 2: crack cocaine 2 - Age of First Use: unknown 2 - Amount (size/oz): unknown 2 - Frequency: 3 times a month 2 - Duration: ongoing 2 - Last Use / Amount: Oct. 2016 unsure of day                Sleep: Fair  Appetite:  Good  Current Medications: Current Facility-Administered  Medications  Medication Dose Route Frequency Provider Last Rate Last Dose  . acetaminophen (TYLENOL) tablet 650 mg  650 mg Oral Q6H PRN Harriet Butte, NP      . albuterol (PROVENTIL HFA;VENTOLIN HFA) 108 (90 BASE) MCG/ACT inhaler 2 puff  2 puff Inhalation Q6H PRN Harriet Butte, NP      . alum & mag hydroxide-simeth (MAALOX/MYLANTA) 200-200-20 MG/5ML suspension 30 mL  30 mL Oral Q4H PRN Harriet Butte, NP      . aspirin chewable tablet 81 mg  81 mg Oral q morning - 10a Harriet Butte, NP   81 mg at 10/09/15 0800  . buPROPion [facility] SR) 12 hr tablet 150 mg  150 mg Oral BID Harriet Butte, NP   150 mg at 10/09/15 0800  . digoxin (LANOXIN) tablet 125 mcg  125 mcg  Oral Daily Harriet Butte, NP   125 mcg at 10/09/15 0800  . dolutegravir (TIVICAY) tablet 50 mg  50 mg Oral Daily Harriet Butte, NP   50 mg at 10/09/15 0800  . emtricitabine-tenofovir AF (DESCOVY) 200-25 MG per tablet 1 tablet  1 tablet Oral Daily Harriet Butte, NP   1 tablet at 10/09/15 0800  . hydrOXYzine (ATARAX/VISTARIL) tablet 25 mg  25 mg Oral QHS PRN Harriet Butte, NP   25 mg at 10/08/15 1852  . lamoTRIgine (LAMICTAL) tablet 25 mg  25 mg Oral BID Niel Hummer, NP      . magnesium hydroxide (MILK OF MAGNESIA) suspension 30 mL  30 mL Oral Daily PRN Harriet Butte, NP      . metoprolol succinate (TOPROL-XL) 24 hr tablet 25 mg  25 mg Oral QHS Harriet Butte, NP   25 mg at 10/08/15 2232  . nicotine (NICODERM CQ - dosed in mg/24 hours) patch 21 mg  21 mg Transdermal Daily Niel Hummer, NP   21 mg at 10/09/15 0800  . spironolactone (ALDACTONE) tablet 12.5 mg  12.5 mg Oral Daily Harriet Butte, NP   12.5 mg at 10/09/15 0800  . traZODone (DESYREL) tablet 150 mg  150 mg Oral QHS Harriet Butte, NP   150 mg at 10/08/15 2232  . valACYclovir (VALTREX) tablet 500 mg  500 mg Oral Daily Harriet Butte, NP   500 mg at 10/09/15 0800    Lab Results:  Results for orders placed or performed during the hospital encounter of  10/07/15 (from the past 48 hour(s))  Comprehensive metabolic panel     Status: Abnormal   Collection Time: 10/07/15  3:40 PM  Result Value Ref Range   Sodium 137 135 - 145 mmol/L   Potassium 3.6 3.5 - 5.1 mmol/L   Chloride 102 101 - 111 mmol/L   CO2 26 22 - 32 mmol/L   Glucose, Bld 77 65 - 99 mg/dL   BUN 14 6 - 20 mg/dL   Creatinine, Ser 1.09 0.61 - 1.24 mg/dL   Calcium 9.1 8.9 - 10.3 mg/dL   Total Protein 6.8 6.5 - 8.1 g/dL   Albumin 4.0 3.5 - 5.0 g/dL   AST 35 15 - 41 U/L   ALT 34 17 - 63 U/L   Alkaline Phosphatase 47 38 - 126 U/L   Total Bilirubin 2.0 (H) 0.3 - 1.2 mg/dL   GFR calc non Af Amer >60 >60 mL/min   GFR calc Af Amer >60 >60 mL/min    Comment: (NOTE) The eGFR has been calculated using the CKD EPI equation. This calculation has not been validated in all clinical situations. eGFR's persistently <60 mL/min signify possible Chronic Kidney Disease.    Anion gap 9 5 - 15  Ethanol (ETOH)     Status: None   Collection Time: 10/07/15  3:40 PM  Result Value Ref Range   Alcohol, Ethyl (B) <5 <5 mg/dL    Comment:        LOWEST DETECTABLE LIMIT FOR SERUM ALCOHOL IS 5 mg/dL FOR MEDICAL PURPOSES ONLY   Salicylate level     Status: None   Collection Time: 10/07/15  3:40 PM  Result Value Ref Range   Salicylate Lvl <9.2 2.8 - 30.0 mg/dL  Acetaminophen level     Status: Abnormal   Collection Time: 10/07/15  3:40 PM  Result Value Ref Range   Acetaminophen (Tylenol), Serum <10 (L) 10 - 30  ug/mL    Comment:        THERAPEUTIC CONCENTRATIONS VARY SIGNIFICANTLY. A RANGE OF 10-30 ug/mL MAY BE AN EFFECTIVE CONCENTRATION FOR MANY PATIENTS. HOWEVER, SOME ARE BEST TREATED AT CONCENTRATIONS OUTSIDE THIS RANGE. ACETAMINOPHEN CONCENTRATIONS >150 ug/mL AT 4 HOURS AFTER INGESTION AND >50 ug/mL AT 12 HOURS AFTER INGESTION ARE OFTEN ASSOCIATED WITH TOXIC REACTIONS.   CBC     Status: Abnormal   Collection Time: 10/07/15  3:40 PM  Result Value Ref Range   WBC 4.6 4.0 - 10.5 K/uL    RBC 5.28 4.22 - 5.81 MIL/uL   Hemoglobin 17.9 (H) 13.0 - 17.0 g/dL   HCT 49.9 39.0 - 52.0 %   MCV 94.5 78.0 - 100.0 fL   MCH 33.9 26.0 - 34.0 pg   MCHC 35.9 30.0 - 36.0 g/dL   RDW 13.0 11.5 - 15.5 %   Platelets 177 150 - 400 K/uL  Urine rapid drug screen (hosp performed) (Not at [facility])     Status: Abnormal   Collection Time: 10/07/15  4:07 PM  Result Value Ref Range   Opiates NONE DETECTED NONE DETECTED   Cocaine POSITIVE (A) NONE DETECTED   Benzodiazepines NONE DETECTED NONE DETECTED   Amphetamines NONE DETECTED NONE DETECTED   Tetrahydrocannabinol NONE DETECTED NONE DETECTED   Barbiturates NONE DETECTED NONE DETECTED    Comment:        DRUG SCREEN FOR MEDICAL PURPOSES ONLY.  IF CONFIRMATION IS NEEDED FOR ANY PURPOSE, NOTIFY LAB WITHIN 5 DAYS.        LOWEST DETECTABLE LIMITS FOR URINE DRUG SCREEN Drug Class       Cutoff (ng/mL) Amphetamine      1000 Barbiturate      200 Benzodiazepine   374 Tricyclics       827 Opiates          300 Cocaine          300 THC              50     Physical Findings: AIMS: Facial and Oral Movements Muscles of Facial Expression: None, normal Lips and Perioral Area: None, normal Jaw: None, normal Tongue: None, normal,Extremity Movements Upper (arms, wrists, hands, fingers): None, normal Lower (legs, knees, ankles, toes): None, normal, Trunk Movements Neck, shoulders, hips: None, normal, Overall Severity Severity of abnormal movements (highest score from questions above): None, normal Incapacitation due to abnormal movements: None, normal Patient's awareness of abnormal movements (rate only patient's report): No Awareness, Dental Status Current problems with teeth and/or dentures?: No Does patient usually wear dentures?: No  CIWA:  CIWA-Ar Total: 0 COWS:  COWS Total Score: 0  Musculoskeletal: Strength & Muscle Tone: within normal limits Gait & Station: normal Patient leans: N/A  Psychiatric Specialty Exam: Review of Systems   Constitutional: Negative.   HENT: Negative.   Eyes: Negative.   Respiratory: Negative.   Cardiovascular: Negative.   Gastrointestinal: Negative.   Genitourinary: Negative.   Musculoskeletal: Negative.   Skin: Negative.   Neurological: Negative.   Endo/Heme/Allergies: Negative.   Psychiatric/Behavioral: Positive for depression, suicidal ideas and substance abuse (Urine drug screen positive for cocaine ). Negative for hallucinations and memory loss. The patient is nervous/anxious. The patient does not have insomnia.     Blood pressure 119/82, pulse 75, temperature 97.7 F (36.5 C), temperature source Oral, resp. rate 18, height 5' 9"  (1.753 m), weight 88.451 kg (195 lb), SpO2 100 %.Body mass index is 28.78 kg/(m^2).  General Appearance: [employer]  Contact::  Fair  Speech:  Clear and Coherent  Volume:  Varies   Mood:  Irritable  Affect:  Depressed  Thought Process:  Goal Directed and Intact  Orientation:  Full (Time, Place, and Person)  Thought Content:  Rumination  Suicidal Thoughts:  Yes.  with intent/plan  Homicidal Thoughts:  No  Memory:  Immediate;   Good Recent;   Good Remote;   Good  Judgement:  Impaired  Insight:  Shallow  Psychomotor Activity:  Increased  Concentration:  Good  Recall:  Good  Fund of Knowledge:Good  Language: Good  Akathisia:  No  Handed:  Right  AIMS (if indicated):     Assets:  Communication Skills Desire for Improvement Housing Leisure Time Physical Health Resilience  ADL's:  Intact  Cognition: WNL  Sleep:      Treatment Plan Summary: Daily contact with patient to assess and evaluate symptoms and progress in treatment and Medication management  -Continue Wellbutrin SR 150 mg BID for depression -Increase Lamictal to 25 mg BID for mood symptoms such as irritability  -Will reevaluate in the am tomorrow for the continued presence of active suicidal ideation.   Elmarie Shiley, NP-C 10/09/2015, 12:07 PM  I reviewed chart and agreed  with the findings and treatment Plan.  Berniece Andreas, MD

## 2015-10-09 NOTE — BHH Counselor (Signed)
Writer spoke w/ Loss adjuster, chartered at [employer] [phone]. Mechele Collin reports that they will save pt's bed at least until Mon am. Writer notified Mechele Collin that pt will be at [facility] for at least until tomorrow.  Evette Cristal, [place] Therapeutic Triage Specialist

## 2015-10-09 NOTE — ED Provider Notes (Signed)
Medical screening examination/treatment/procedure(s) were conducted as a shared visit with resident-physician practitioner(s) and myself.  I personally evaluated the patient during the encounter.  Pt is a 54 y.o. male with pmhx of HIV, hyperlipidemia, nonischemic cardiomyopathy, polysubstance abuse, COPD, depression, who presents with concern for suicidal ideation. Patient reports he would "take a bunch of sleeping pills" Also reports intermittent moving diagonal black lines in his vision bilaterally--hx not consistent with retinal detachment, CVA, and given chronicity recommend outpt follow up. Patient medically cleared and transferred to [facility].  Alvira Monday, MD 10/09/15 [phone]

## 2015-10-09 NOTE — BHH Counselor (Signed)
This Clinical research associate spoke with pt., and introduced self. Patient mentioned that he had resources and did not wish to have additional help or resources nor discuss d/c at this time,and that he . This Clinical research associate assured patient and stated that if he needed or wanted help to ask. Deaundra Kutzer K. Tiburcio Pea, MS  Counselor 10/09/2015 8:46 PM

## 2015-10-10 DIAGNOSIS — F339 Major depressive disorder, recurrent, unspecified: Secondary | ICD-10-CM

## 2015-10-10 MED ORDER — EMTRICITABINE-TENOFOVIR AF 200-25 MG PO TABS: 1.0000 | ORAL_TABLET | Freq: Every day | ORAL | Status: DC

## 2015-10-10 MED ORDER — LAMOTRIGINE 25 MG PO TABS: 25.0000 mg | ORAL_TABLET | Freq: Two times a day (BID) | ORAL | Status: DC

## 2015-10-10 MED ORDER — DOLUTEGRAVIR SODIUM 50 MG PO TABS: 50.0000 mg | ORAL_TABLET | Freq: Every day | ORAL | Status: DC

## 2015-10-10 MED ORDER — BUPROPION HCL ER (SR) 150 MG PO TB12: 150.0000 mg | ORAL_TABLET | Freq: Two times a day (BID) | ORAL | Status: DC

## 2015-10-10 MED ORDER — TRAZODONE HCL 150 MG PO TABS: 150.0000 mg | ORAL_TABLET | Freq: Every day | ORAL | Status: DC

## 2015-10-10 NOTE — Progress Notes (Signed)
Patient refused vital signs this morning.

## 2015-10-10 NOTE — Progress Notes (Signed)
Pt discharged home 'I feel better". -SI/HI/A/V/H. All belongings returned to patient. Discharge instructions reviewed and medications (samples) given to patient.

## 2015-10-10 NOTE — Progress Notes (Signed)
Pt alert, irritable but cooperative.  "I'm still having thoughts of killing myself". Pt reports plan to "Jump out in front of traffic". -A/V/H, -HI. Reported stressors "I have been waiting on an apartment for a month. I paid the money, I don't know what is the problem".  Emotional support and encouragement given. Will continue to monitor closely and evaluate for stabilization
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
## 2015-10-10 NOTE — Progress Notes (Signed)
D: Patient seen watching TV at the beginning of the shift. Refused to respond or engage in a conversation with this Clinical research associate.  Patient requested for a drink and a change to a TV channel. Writer offered patient ginger ale and TV channel changed to BET-patient ok with that and requeted TV volume turned up. Patient later told another staff to get him ginger ale and turned up the TV volume higher. The staff on trying to inquire from the writer if it is ok to get the patient ginger ale, patient became agitated and demanding to talk to the supervisor. AC called, Chyrl Civatte and Minerva Areola walked in and de-escalated the patient. Patient initially refused prn of Vistatril offered by this Clinical research associate but later accepted it with the due medications.  Patient calm and sleeping at this time. Will continue to monitor patient.

## 2015-10-10 NOTE — Discharge Instructions (Signed)
Patient will be discharged this date and will follow up with [facility] to explore what long term housing options are available. Patient has a bed at [facility] until he can be approved for long term housing. Patient and this Clinical research associate also reviewed his  relapse prevent plan and patient was linked to information in the area in reference to outpatient therapy for SA issues. Patient will also receive enough medications upon discharge to allow him to continue his medication regimen until the first of the month when he receives his check. Patient will also be provided with transportation to [facility].

## 2015-10-11 ENCOUNTER — Telehealth: Payer: Self-pay | Admitting: *Deleted

## 2015-10-11 ENCOUNTER — Other Ambulatory Visit: Payer: Self-pay | Admitting: *Deleted

## 2015-10-11 MED ORDER — EMTRICITABINE-TENOFOVIR DF 200-300 MG PO TABS: 1.0000 | ORAL_TABLET | Freq: Every day | ORAL | Status: DC

## 2015-10-11 NOTE — Telephone Encounter (Signed)
Send script for Truvada for now to take with Tivicay until he can get new Descovy and Tivicay. See my prior clinic note re his behavioral issues. He needs to see Bernette Redbird, jodie full panel next time he comes in

## 2015-10-11 NOTE — Telephone Encounter (Signed)
Patient notified and Rx sent to [facility]. Ryan Long
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
## 2015-10-11 NOTE — Telephone Encounter (Signed)
Thanks Jackie 

## 2015-10-13 ENCOUNTER — Other Ambulatory Visit (HOSPITAL_COMMUNITY)
Admission: RE | Admit: 2015-10-13 | Discharge: 2015-10-13 | Disposition: A | Discharge status: Home / Self Care | Payer: Medicaid Other | Source: Ambulatory Visit | Attending: Infectious Disease | Admitting: Infectious Disease

## 2015-10-13 ENCOUNTER — Other Ambulatory Visit: Payer: 59

## 2015-10-13 DIAGNOSIS — Z113 Encounter for screening for infections with a predominantly sexual mode of transmission: Secondary | ICD-10-CM | POA: Diagnosis present

## 2015-10-13 DIAGNOSIS — B20 Human immunodeficiency virus [HIV] disease: Secondary | ICD-10-CM

## 2015-10-13 LAB — CBC WITH DIFFERENTIAL/PLATELET
Basophils Absolute: 0 10*3/uL (ref 0.0–0.1)
Basophils Relative: 1 % (ref 0–1)
Eosinophils Absolute: 0.1 10*3/uL (ref 0.0–0.7)
Eosinophils Relative: 2 % (ref 0–5)
HEMATOCRIT: 48.3 % (ref 39.0–52.0)
HEMOGLOBIN: 17 g/dL (ref 13.0–17.0)
LYMPHS PCT: 48 % — AB (ref 12–46)
Lymphs Abs: 1.5 10*3/uL (ref 0.7–4.0)
MCH: 33.3 pg (ref 26.0–34.0)
MCHC: 35.2 g/dL (ref 30.0–36.0)
MCV: 94.7 fL (ref 78.0–100.0)
MONO ABS: 0.4 10*3/uL (ref 0.1–1.0)
MONOS PCT: 12 % (ref 3–12)
MPV: 9.6 fL (ref 8.6–12.4)
NEUTROS ABS: 1.2 10*3/uL — AB (ref 1.7–7.7)
Neutrophils Relative %: 37 % — ABNORMAL LOW (ref 43–77)
Platelets: 182 10*3/uL (ref 150–400)
RBC: 5.1 MIL/uL (ref 4.22–5.81)
RDW: 13.2 % (ref 11.5–15.5)
WBC: 3.2 10*3/uL — AB (ref 4.0–10.5)

## 2015-10-13 LAB — COMPLETE METABOLIC PANEL WITH GFR
ALBUMIN: 3.9 g/dL (ref 3.6–5.1)
ALK PHOS: 53 U/L (ref 40–115)
ALT: 25 U/L (ref 9–46)
AST: 22 U/L (ref 10–35)
BILIRUBIN TOTAL: 0.9 mg/dL (ref 0.2–1.2)
BUN: 16 mg/dL (ref 7–25)
CALCIUM: 9 mg/dL (ref 8.6–10.3)
CO2: 26 mmol/L (ref 20–31)
CREATININE: 0.95 mg/dL (ref 0.70–1.33)
Chloride: 103 mmol/L (ref 98–110)
GFR, Est African American: >89 mL/min (ref >=60)
GFR, Est Non African American: >89 mL/min (ref >=60)
Glucose, Bld: 88 mg/dL (ref 65–99)
POTASSIUM: 4.1 mmol/L (ref 3.5–5.3)
Sodium: 140 mmol/L (ref 135–146)
TOTAL PROTEIN: 6.4 g/dL (ref 6.1–8.1)

## 2015-10-14 ENCOUNTER — Other Ambulatory Visit: Payer: Self-pay

## 2015-10-14 LAB — RPR

## 2015-10-15 LAB — T-HELPER CELL (CD4) - (RCID CLINIC ONLY)
CD4 % Helper T Cell: 39 % (ref 33–55)
CD4 T Cell Abs: 590 /uL (ref 400–2700)

## 2015-10-15 LAB — HIV-1 RNA QUANT-NO REFLEX-BLD: HIV 1 RNA Quant: <20 copies/mL (ref <20)

## 2015-10-15 LAB — URINE CYTOLOGY ANCILLARY ONLY
CHLAMYDIA, DNA PROBE: NEGATIVE
NEISSERIA GONORRHEA: NEGATIVE

## 2015-10-17 ENCOUNTER — Emergency Department (HOSPITAL_COMMUNITY): Payer: Medicaid Other

## 2015-10-17 ENCOUNTER — Emergency Department (HOSPITAL_COMMUNITY)
Admission: EM | Admit: 2015-10-17 | Discharge: 2015-10-17 | Disposition: A | Discharge status: Home / Self Care | Payer: Medicaid Other | Attending: Emergency Medicine | Admitting: Emergency Medicine

## 2015-10-17 ENCOUNTER — Encounter (HOSPITAL_COMMUNITY): Payer: Self-pay | Admitting: *Deleted

## 2015-10-17 DIAGNOSIS — F4310 Post-traumatic stress disorder, unspecified: Secondary | ICD-10-CM | POA: Diagnosis not present

## 2015-10-17 DIAGNOSIS — Z720 Tobacco use: Secondary | ICD-10-CM | POA: Insufficient documentation

## 2015-10-17 DIAGNOSIS — F419 Anxiety disorder, unspecified: Secondary | ICD-10-CM | POA: Diagnosis not present

## 2015-10-17 DIAGNOSIS — F319 Bipolar disorder, unspecified: Secondary | ICD-10-CM | POA: Insufficient documentation

## 2015-10-17 DIAGNOSIS — J441 Chronic obstructive pulmonary disease with (acute) exacerbation: Secondary | ICD-10-CM | POA: Diagnosis not present

## 2015-10-17 DIAGNOSIS — R000 Tachycardia, unspecified: Secondary | ICD-10-CM | POA: Insufficient documentation

## 2015-10-17 DIAGNOSIS — Z8659 Personal history of other mental and behavioral disorders: Secondary | ICD-10-CM | POA: Diagnosis not present

## 2015-10-17 DIAGNOSIS — R51 Headache: Secondary | ICD-10-CM | POA: Diagnosis not present

## 2015-10-17 DIAGNOSIS — I10 Essential (primary) hypertension: Secondary | ICD-10-CM | POA: Insufficient documentation

## 2015-10-17 DIAGNOSIS — Z8639 Personal history of other endocrine, nutritional and metabolic disease: Secondary | ICD-10-CM | POA: Insufficient documentation

## 2015-10-17 DIAGNOSIS — R05 Cough: Secondary | ICD-10-CM | POA: Diagnosis present

## 2015-10-17 DIAGNOSIS — Z21 Asymptomatic human immunodeficiency virus [HIV] infection status: Secondary | ICD-10-CM | POA: Diagnosis not present

## 2015-10-17 DIAGNOSIS — I5022 Chronic systolic (congestive) heart failure: Secondary | ICD-10-CM | POA: Diagnosis not present

## 2015-10-17 DIAGNOSIS — Z8619 Personal history of other infectious and parasitic diseases: Secondary | ICD-10-CM | POA: Diagnosis not present

## 2015-10-17 DIAGNOSIS — Z8709 Personal history of other diseases of the respiratory system: Secondary | ICD-10-CM | POA: Diagnosis not present

## 2015-10-17 DIAGNOSIS — Z79899 Other long term (current) drug therapy: Secondary | ICD-10-CM | POA: Insufficient documentation

## 2015-10-17 LAB — COMPREHENSIVE METABOLIC PANEL
ALBUMIN: 3.7 g/dL (ref 3.5–5.0)
ALT: 24 U/L (ref 17–63)
ANION GAP: 10 (ref 5–15)
AST: 26 U/L (ref 15–41)
Alkaline Phosphatase: 49 U/L (ref 38–126)
BUN: 12 mg/dL (ref 6–20)
CO2: 27 mmol/L (ref 22–32)
Calcium: 8.9 mg/dL (ref 8.9–10.3)
Chloride: 101 mmol/L (ref 101–111)
Creatinine, Ser: 1.12 mg/dL (ref 0.61–1.24)
GFR calc non Af Amer: >60 mL/min (ref >60)
GLUCOSE: 109 mg/dL — AB (ref 65–99)
POTASSIUM: 3.7 mmol/L (ref 3.5–5.1)
SODIUM: 138 mmol/L (ref 135–145)
TOTAL PROTEIN: 6.3 g/dL — AB (ref 6.5–8.1)
Total Bilirubin: 0.7 mg/dL (ref 0.3–1.2)

## 2015-10-17 LAB — CBC
HEMATOCRIT: 47.9 % (ref 39.0–52.0)
HEMOGLOBIN: 17.2 g/dL — AB (ref 13.0–17.0)
MCH: 33.9 pg (ref 26.0–34.0)
MCHC: 35.9 g/dL (ref 30.0–36.0)
MCV: 94.3 fL (ref 78.0–100.0)
Platelets: 157 10*3/uL (ref 150–400)
RBC: 5.08 MIL/uL (ref 4.22–5.81)
RDW: 12.7 % (ref 11.5–15.5)
WBC: 2.5 10*3/uL — ABNORMAL LOW (ref 4.0–10.5)

## 2015-10-17 MED ORDER — PREDNISONE 20 MG PO TABS
60.0000 mg | ORAL_TABLET | Freq: Once | ORAL | Status: AC
  Administered 2015-10-17: 60 mg via ORAL
  Filled 2015-10-17: qty 3

## 2015-10-17 MED ORDER — ALBUTEROL SULFATE HFA 108 (90 BASE) MCG/ACT IN AERS
2.0000 | INHALATION_SPRAY | Freq: Once | RESPIRATORY_TRACT | Status: AC
  Administered 2015-10-17: 2 via RESPIRATORY_TRACT
  Filled 2015-10-17: qty 6.7

## 2015-10-17 MED ORDER — PREDNISONE 20 MG PO TABS: 40.0000 mg | ORAL_TABLET | Freq: Every day | ORAL | Status: DC

## 2015-10-17 MED ORDER — DOXYCYCLINE HYCLATE 100 MG PO CAPS: 100.0000 mg | ORAL_CAPSULE | Freq: Two times a day (BID) | ORAL | Status: DC

## 2015-10-17 NOTE — ED Notes (Signed)
MD at bedside. 

## 2015-10-17 NOTE — ED Notes (Signed)
PT is here with coughing sneezing and wheezing.  Pt reports nausea and headaches for a week to ten days.  Pt states coughing up green and yellow sputum.

## 2015-10-17 NOTE — ED Provider Notes (Signed)
CSN: [id number]     Arrival date & time 10/17/15  1125 History   First MD Initiated Contact with Patient 10/17/15 1238     Chief Complaint  Patient presents with  . Wheezing  . Cough     (Consider location/radiation/quality/duration/timing/severity/associated sxs/prior Treatment) Patient is a 54 y.o. male presenting with wheezing. The history is provided by the patient.  Wheezing Severity:  Moderate Severity compared to prior episodes:  Similar Onset quality:  Gradual Duration:  9 days Timing:  Intermittent Progression:  Unchanged Chronicity:  Recurrent Relieved by:  None tried Worsened by:  Activity Ineffective treatments:  None tried Associated symptoms: cough, headaches, shortness of breath and sputum production   Associated symptoms: no chest pain, no chest tightness, no fatigue, no fever, no foot swelling, no orthopnea, no PND, no rash, no rhinorrhea, no sore throat, no stridor and no swollen glands   Risk factors comment:  Hx of COPD   Past Medical History  Diagnosis Date  . HIV (human immunodeficiency virus infection) (HCC)   . Chronic systolic heart failure (HCC)   . Hypertension   . Genital herpes   . Active smoker   . AR (allergic rhinitis)   . HLD (hyperlipidemia)   . NICM (nonischemic cardiomyopathy) (HCC)     Echocardiogram 06/28/11: EF 30-35%, mild MR, mild LAE;  No CAD by coronary CT angiogram 3/12 at [facility]  . NSVT (nonsustained ventricular tachycardia) (HCC)   . Depression   . Bipolar 1 disorder (HCC)   . Alcohol abuse   . Crack cocaine use   . COPD (chronic obstructive pulmonary disease) (HCC)   . CHF (congestive heart failure) (HCC)   . Anxiety   . PTSD (post-traumatic stress disorder)    Past Surgical History  Procedure Laterality Date  . No past surgeries     Family History  Problem Relation Age of Onset  . Hypertension Father   . CAD Father    Social History  Substance Use Topics  . Smoking status: Current Every  Day Smoker -- 0.50 packs/day for 30 years    Types: Cigarettes  . Smokeless tobacco: Never Used     Comment: 15 cigarettes daily  . Alcohol Use: 3.6 oz/week    6 Cans of beer per week     Comment: reports drinks 6+ beers 3 times a month    Review of Systems  Constitutional: Negative for fever and fatigue.  HENT: Negative for facial swelling, rhinorrhea and sore throat.   Respiratory: Positive for cough, sputum production, shortness of breath and wheezing. Negative for chest tightness and stridor.   Cardiovascular: Negative for chest pain, orthopnea and PND.  Gastrointestinal: Negative for abdominal pain.  Genitourinary: Negative for dysuria.  Musculoskeletal: Negative for back pain.  Skin: Negative for rash.  Neurological: Positive for headaches.  Psychiatric/Behavioral: Negative for confusion.      Allergies  Review of patient's allergies indicates no known allergies.  Home Medications   Prior to Admission medications   Medication Sig Start Date End Date Taking? Authorizing Provider  albuterol (PROVENTIL HFA;VENTOLIN HFA) 108 (90 BASE) MCG/ACT inhaler Inhale 2 puffs into the lungs every 6 (six) hours as needed for wheezing or shortness of breath. 10/04/15   Jaclyn Shaggy, MD  aspirin 81 MG chewable tablet Chew 1 tablet (81 mg total) by mouth every morning. For heart health 09/28/15   Adonis Brook, NP  buPROPion [facility] SR) 150 MG 12 hr tablet Take 1 tablet (150 mg total) by mouth 2 (  two) times daily. 10/10/15   Thermon Leyland, NP  digoxin (LANOXIN) 0.125 MG tablet Take 1 tablet (125 mcg total) by mouth daily. For control of heart arrythmia. 08/31/14   Sanjuana Kava, NP  dolutegravir (TIVICAY) 50 MG tablet Take 1 tablet (50 mg total) by mouth daily. 10/10/15   Thermon Leyland, NP  doxycycline (VIBRAMYCIN) 100 MG capsule Take 1 capsule (100 mg total) by mouth 2 (two) times daily. 10/17/15   Gavin Pound, MD  emtricitabine-tenofovir (TRUVADA) 200-300 MG tablet Take 1 tablet by  mouth daily. 10/11/15   Randall Hiss, MD  emtricitabine-tenofovir AF (DESCOVY) 200-25 MG tablet Take 1 tablet by mouth daily. 10/10/15   Thermon Leyland, NP  hydrOXYzine (ATARAX/VISTARIL) 25 MG tablet Take 1 tablet (25 mg total) by mouth at bedtime as needed for anxiety. 09/28/15   Adonis Brook, NP  lamoTRIgine (LAMICTAL) 25 MG tablet Take 1 tablet (25 mg total) by mouth 2 (two) times daily. 10/10/15   Thermon Leyland, NP  metoprolol succinate (TOPROL-XL) 25 MG 24 hr tablet Take 1 tablet (25 mg total) by mouth at bedtime. For high blood pressure 09/28/15   Adonis Brook, NP  predniSONE (DELTASONE) 20 MG tablet Take 2 tablets (40 mg total) by mouth daily. 10/17/15   Gavin Pound, MD  spironolactone (ALDACTONE) 25 MG tablet Take 0.5 tablets (12.5 mg total) by mouth daily. For heart condition 09/28/15   Adonis Brook, NP  traZODone (DESYREL) 150 MG tablet Take 1 tablet (150 mg total) by mouth at bedtime. 10/10/15   Thermon Leyland, NP  valACYclovir (VALTREX) 500 MG tablet Take 1 tablet (500 mg total) by mouth daily. For Herpes. 09/28/15   Adonis Brook, NP   BP 115/75 mmHg  Pulse 87  Temp(Src) 98.2 F (36.8 C) (Oral)  Resp 15  Ht 5\' 9"  (1.753 m)  Wt 199 lb 1.6 oz (90.311 kg)  BMI 29.39 kg/m2  SpO2 100% Physical Exam  Constitutional: He is oriented to person, place, and time. He appears well-developed and well-nourished. No distress.  HENT:  Head: Normocephalic and atraumatic.  Right Ear: External ear normal.  Left Ear: External ear normal.  Nose: Nose normal.  Mouth/Throat: Oropharynx is clear and moist. No oropharyngeal exudate.  Eyes: Conjunctivae and EOM are normal. Pupils are equal, round, and reactive to light. Right eye exhibits no discharge. Left eye exhibits no discharge. No scleral icterus.  Neck: Normal range of motion. Neck supple. No JVD present. No tracheal deviation present. No thyromegaly present.  Cardiovascular: Regular rhythm and intact distal pulses.  Tachycardia  present.   Pulmonary/Chest: Effort normal. No stridor. No respiratory distress. He has wheezes. He has no rales. He exhibits no tenderness.  Abdominal: Soft. He exhibits no distension.  Musculoskeletal: Normal range of motion. He exhibits no edema or tenderness.  Lymphadenopathy:    He has no cervical adenopathy.  Neurological: He is alert and oriented to person, place, and time.  Skin: Skin is warm and dry. No rash noted. He is not diaphoretic. No erythema. No pallor.  Psychiatric: He has a normal mood and affect. His behavior is normal. Judgment and thought content normal.  Nursing note and vitals reviewed.   ED Course  Procedures (including critical care time) Labs Review Labs Reviewed  COMPREHENSIVE METABOLIC PANEL - Abnormal; Notable for the following:    Glucose, Bld 109 (*)    Total Protein 6.3 (*)    All other components within normal limits  CBC - Abnormal; Notable  for the following:    WBC 2.5 (*)    Hemoglobin 17.2 (*)    All other components within normal limits    Imaging Review Dg Chest 2 View  10/17/2015  CLINICAL DATA:  Shortness of breath, productive cough, nausea and chills for 1 week. EXAM: CHEST  2 VIEW COMPARISON:  None. FINDINGS: Heart size is normal. Overall cardiomediastinal silhouette is normal in size and configuration. Lungs are hyperexpanded compatible with the given history of COPD. Lungs are clear. No pleural effusion. No pneumothorax. No acute osseous abnormality. IMPRESSION: 1. Lungs are hyperexpanded consistent with history of COPD. 2. No evidence of acute cardiopulmonary abnormality. No pneumonia seen. Electronically Signed   By: Bary Richard M.D.   On: 10/17/2015 12:48   I have personally reviewed and evaluated these images and lab results as part of my medical decision-making.  MDM   Final diagnoses:  COPD exacerbation (HCC)    Patient states he has a history of COPD has not had any albuterol at home recently had increased cough and  wheezing. Has been going on for about 8-9 days. Patient has had some associated sputum production. No fevers or chills. Patient with mild wheezing in the emergency department. Worsened with coughing. No other associated review of systems of concern patient's otherwise in no acute distress. Patient was given a breathing treatment improvement in his symptoms also a dose of prednisone. Patient will be discharged with 4 days of prednisone and has received albuterol in the emergency department to take home. Chest x-ray does not show focal pneumonia but I will give him a course of doxycycline due to history COPD with associated exacerbation.  Patient was given return precautions for COPD.  Pt advised on use of medications as applicable.  Advised to return for actely worsening symptoms, inability to take medications, or other acute concerns.  Advised to follow up with PCP in 2-3 days.  Patient was in agreement with and expressed understanding of follow plan, plan of care, and return precautions.  All questions answered prior to discharge.  Patient was discharged in stable condition, ambulating without difficulty.   Patient care was discussed with my attending, Dr. Criss Alvine.     Gavin Pound, MD 10/18/15 1557  Pricilla Loveless, MD 10/21/15 [phone]

## 2015-10-20 ENCOUNTER — Other Ambulatory Visit: Payer: Self-pay | Admitting: Family Medicine

## 2015-10-21 ENCOUNTER — Encounter (HOSPITAL_COMMUNITY): Payer: Self-pay

## 2015-10-27 ENCOUNTER — Ambulatory Visit: Payer: Self-pay | Admitting: Infectious Disease

## 2015-10-27 ENCOUNTER — Ambulatory Visit: Payer: Self-pay | Admitting: Family Medicine

## 2015-11-02 ENCOUNTER — Telehealth: Payer: Self-pay | Admitting: Family Medicine

## 2015-11-02 NOTE — Telephone Encounter (Signed)
Patient called and requested med refills for some of his medications, patient was unsure of which ones and would like to speak to nurse. Please f/u with pt.

## 2015-11-04 ENCOUNTER — Other Ambulatory Visit (HOSPITAL_COMMUNITY): Payer: Self-pay | Admitting: *Deleted

## 2015-11-04 MED ORDER — DIGOXIN 125 MCG PO TABS: 0.1250 mg | ORAL_TABLET | Freq: Every day | ORAL | Status: DC

## 2015-11-08 NOTE — Telephone Encounter (Signed)
Pt advised to call pharmacy for refills  

## 2015-11-09 ENCOUNTER — Other Ambulatory Visit: Payer: Self-pay | Admitting: *Deleted

## 2015-11-09 DIAGNOSIS — J438 Other emphysema: Secondary | ICD-10-CM

## 2015-11-09 MED ORDER — ALBUTEROL SULFATE HFA 108 (90 BASE) MCG/ACT IN AERS: 2.0000 | INHALATION_SPRAY | Freq: Four times a day (QID) | RESPIRATORY_TRACT | Status: DC | PRN

## 2015-11-09 NOTE — Telephone Encounter (Signed)
Patients inhaler was refilled 

## 2015-11-16 ENCOUNTER — Inpatient Hospital Stay (HOSPITAL_COMMUNITY): Admission: RE | Admit: 2015-11-16 | Payer: Self-pay | Source: Ambulatory Visit

## 2015-11-22 ENCOUNTER — Ambulatory Visit: Payer: Self-pay | Admitting: Infectious Disease

## 2015-11-25 ENCOUNTER — Inpatient Hospital Stay (HOSPITAL_COMMUNITY): Admission: RE | Admit: 2015-11-25 | Payer: Self-pay | Source: Ambulatory Visit

## 2015-11-29 ENCOUNTER — Other Ambulatory Visit: Payer: Self-pay | Admitting: Family Medicine

## 2015-11-30 NOTE — Telephone Encounter (Signed)
He needs an appointment to establish care with Dr.Funches after which you can give him only a 30 day supply.

## 2015-12-02 ENCOUNTER — Ambulatory Visit: Payer: Self-pay | Admitting: Family Medicine

## 2015-12-06 ENCOUNTER — Other Ambulatory Visit: Payer: Self-pay | Admitting: Family Medicine

## 2015-12-06 MED ORDER — SPIRONOLACTONE 25 MG PO TABS: 12.5000 mg | ORAL_TABLET | Freq: Every day | ORAL | Status: DC

## 2015-12-11 ENCOUNTER — Emergency Department (HOSPITAL_COMMUNITY): Payer: Medicaid Other

## 2015-12-11 ENCOUNTER — Encounter (HOSPITAL_COMMUNITY): Payer: Self-pay | Admitting: *Deleted

## 2015-12-11 ENCOUNTER — Emergency Department (HOSPITAL_COMMUNITY)
Admission: EM | Admit: 2015-12-11 | Discharge: 2015-12-11 | Disposition: A | Discharge status: Home / Self Care | Payer: Medicaid Other | Attending: Emergency Medicine | Admitting: Emergency Medicine

## 2015-12-11 DIAGNOSIS — R300 Dysuria: Secondary | ICD-10-CM | POA: Diagnosis not present

## 2015-12-11 DIAGNOSIS — Z8639 Personal history of other endocrine, nutritional and metabolic disease: Secondary | ICD-10-CM | POA: Insufficient documentation

## 2015-12-11 DIAGNOSIS — Z202 Contact with and (suspected) exposure to infections with a predominantly sexual mode of transmission: Secondary | ICD-10-CM | POA: Diagnosis not present

## 2015-12-11 DIAGNOSIS — I10 Essential (primary) hypertension: Secondary | ICD-10-CM | POA: Diagnosis not present

## 2015-12-11 DIAGNOSIS — F419 Anxiety disorder, unspecified: Secondary | ICD-10-CM | POA: Insufficient documentation

## 2015-12-11 DIAGNOSIS — F4310 Post-traumatic stress disorder, unspecified: Secondary | ICD-10-CM | POA: Insufficient documentation

## 2015-12-11 DIAGNOSIS — I5022 Chronic systolic (congestive) heart failure: Secondary | ICD-10-CM | POA: Diagnosis not present

## 2015-12-11 DIAGNOSIS — F319 Bipolar disorder, unspecified: Secondary | ICD-10-CM | POA: Insufficient documentation

## 2015-12-11 DIAGNOSIS — N50812 Left testicular pain: Secondary | ICD-10-CM

## 2015-12-11 DIAGNOSIS — J449 Chronic obstructive pulmonary disease, unspecified: Secondary | ICD-10-CM | POA: Diagnosis not present

## 2015-12-11 DIAGNOSIS — Z7952 Long term (current) use of systemic steroids: Secondary | ICD-10-CM | POA: Insufficient documentation

## 2015-12-11 DIAGNOSIS — Z79899 Other long term (current) drug therapy: Secondary | ICD-10-CM | POA: Diagnosis not present

## 2015-12-11 DIAGNOSIS — F17210 Nicotine dependence, cigarettes, uncomplicated: Secondary | ICD-10-CM | POA: Diagnosis not present

## 2015-12-11 DIAGNOSIS — Z7982 Long term (current) use of aspirin: Secondary | ICD-10-CM | POA: Insufficient documentation

## 2015-12-11 DIAGNOSIS — B20 Human immunodeficiency virus [HIV] disease: Secondary | ICD-10-CM | POA: Diagnosis not present

## 2015-12-11 LAB — RPR: RPR: NONREACTIVE

## 2015-12-11 MED ORDER — STERILE WATER FOR INJECTION IJ SOLN
10.0000 mL | Freq: Once | INTRAMUSCULAR | Status: AC
Start: 2015-12-11 — End: 2015-12-11
  Administered 2015-12-11: 10 mL via INTRAMUSCULAR
  Filled 2015-12-11: qty 10

## 2015-12-11 MED ORDER — IBUPROFEN 800 MG PO TABS: 800.0000 mg | ORAL_TABLET | Freq: Three times a day (TID) | ORAL | Status: DC

## 2015-12-11 MED ORDER — DOXYCYCLINE HYCLATE 100 MG PO TABS
100.0000 mg | ORAL_TABLET | Freq: Once | ORAL | Status: AC
  Administered 2015-12-11: 100 mg via ORAL
  Filled 2015-12-11: qty 1

## 2015-12-11 MED ORDER — AZITHROMYCIN 250 MG PO TABS
1000.0000 mg | ORAL_TABLET | Freq: Once | ORAL | Status: AC
  Administered 2015-12-11: 1000 mg via ORAL
  Filled 2015-12-11: qty 4

## 2015-12-11 MED ORDER — ONDANSETRON 4 MG PO TBDP
4.0000 mg | ORAL_TABLET | Freq: Once | ORAL | Status: AC
  Administered 2015-12-11: 4 mg via ORAL
  Filled 2015-12-11: qty 1

## 2015-12-11 MED ORDER — CEFTRIAXONE SODIUM 250 MG IJ SOLR
250.0000 mg | Freq: Once | INTRAMUSCULAR | Status: AC
  Administered 2015-12-11: 250 mg via INTRAMUSCULAR
  Filled 2015-12-11: qty 250

## 2015-12-11 MED ORDER — METRONIDAZOLE 500 MG PO TABS
2000.0000 mg | ORAL_TABLET | Freq: Once | ORAL | Status: AC
  Administered 2015-12-11: 2000 mg via ORAL
  Filled 2015-12-11: qty 4

## 2015-12-11 MED ORDER — IBUPROFEN 400 MG PO TABS
800.0000 mg | ORAL_TABLET | Freq: Once | ORAL | Status: AC
  Administered 2015-12-11: 800 mg via ORAL
  Filled 2015-12-11: qty 4

## 2015-12-11 MED ORDER — DOXYCYCLINE HYCLATE 100 MG PO CAPS: 100.0000 mg | ORAL_CAPSULE | Freq: Two times a day (BID) | ORAL | Status: DC

## 2015-12-11 NOTE — ED Notes (Signed)
Pt reports his partner call to tell his they were postive for STD. Pt also reports dysuria ,drainage and testicular  pain.

## 2015-12-11 NOTE — Discharge Instructions (Signed)
You have been seen today for a STD check and treatment. Follow up with PCP as needed. Return to ED should symptoms worsen. Your lab results will be called to you if they are abnormal. May use ibuprofen or Tylenol for pain. Return to the ED or seek treatment should he start to have fever, nausea/vomiting, increased testicular pain, abdominal pain, or any other major concerns.

## 2015-12-11 NOTE — ED Notes (Signed)
Declined W/C at D/C and was escorted to lobby by RN. 

## 2015-12-11 NOTE — ED Provider Notes (Signed)
CSN: [id number]     Arrival date & time 12/11/15  0718 History   First MD Initiated Contact with Patient 12/11/15 [phone]     Chief Complaint  Patient presents with  . Exposure to STD     (Consider location/radiation/quality/duration/timing/severity/associated sxs/prior Treatment) HPI   Ryan Long is a 54 y.o. male, with a history of HIV, genital herpes, presenting to the ED with exposure to a STD and requests a STD test. Pt endorses burning with urination, penile discharge, and left testicular pain for the past three days. Pt rates his pain at 8/10, sharp and steady, non-radiating. Pt states his current sexual partner notified him that she has a STD, but did not say which one. Pt last sexual contact was about 10 days ago. Pt denies fever/chills, N/V, abdominal pain, difficulty urinating, or any other pain or complaints.     Past Medical History  Diagnosis Date  . HIV (human immunodeficiency virus infection) (HCC)   . Chronic systolic heart failure (HCC)   . Hypertension   . Genital herpes   . Active smoker   . AR (allergic rhinitis)   . HLD (hyperlipidemia)   . NICM (nonischemic cardiomyopathy) (HCC)     Echocardiogram 06/28/11: EF 30-35%, mild MR, mild LAE;  No CAD by coronary CT angiogram 3/12 at [facility]  . NSVT (nonsustained ventricular tachycardia) (HCC)   . Depression   . Bipolar 1 disorder (HCC)   . Alcohol abuse   . Crack cocaine use   . COPD (chronic obstructive pulmonary disease) (HCC)   . CHF (congestive heart failure) (HCC)   . Anxiety   . PTSD (post-traumatic stress disorder)    Past Surgical History  Procedure Laterality Date  . No past surgeries     Family History  Problem Relation Age of Onset  . Hypertension Father   . CAD Father    Social History  Substance Use Topics  . Smoking status: Current Every Day Smoker -- 0.50 packs/day for 30 years    Types: Cigarettes  . Smokeless tobacco: Never Used     Comment: 15 cigarettes daily   . Alcohol Use: 3.6 oz/week    6 Cans of beer per week     Comment: reports drinks 6+ beers 3 times a month    Review of Systems  Constitutional: Negative for fever, chills and diaphoresis.  Gastrointestinal: Negative for nausea, vomiting and abdominal pain.  Genitourinary: Positive for dysuria, discharge and testicular pain (Left testicle). Negative for hematuria, flank pain, penile swelling, scrotal swelling, difficulty urinating and genital sores.  Musculoskeletal: Negative for back pain.  Skin: Negative for color change and pallor.  Neurological: Negative for dizziness, syncope, weakness, light-headedness and headaches.  All other systems reviewed and are negative.     Allergies  Review of patient's allergies indicates no known allergies.  Home Medications   Prior to Admission medications   Medication Sig Start Date End Date Taking? Authorizing Provider  albuterol (PROVENTIL HFA;VENTOLIN HFA) 108 (90 BASE) MCG/ACT inhaler Inhale 2 puffs into the lungs every 6 (six) hours as needed for wheezing or shortness of breath. 11/09/15   Jaclyn Shaggy, MD  aspirin 81 MG chewable tablet Chew 1 tablet (81 mg total) by mouth every morning. For heart health 09/28/15   Adonis Brook, NP  buPROPion [facility] SR) 150 MG 12 hr tablet Take 1 tablet (150 mg total) by mouth 2 (two) times daily. 10/10/15   Thermon Leyland, NP  digoxin (LANOXIN) 0.125 MG tablet Take  1 tablet (0.125 mg total) by mouth daily. For control of heart arrythmia. 11/04/15   Laurey Morale, MD  dolutegravir (TIVICAY) 50 MG tablet Take 1 tablet (50 mg total) by mouth daily. 10/10/15   Thermon Leyland, NP  doxycycline (VIBRAMYCIN) 100 MG capsule Take 1 capsule (100 mg total) by mouth 2 (two) times daily. 12/11/15   Aldrick Derrig C Hillari Zumwalt, PA-C  emtricitabine-tenofovir (TRUVADA) 200-300 MG tablet Take 1 tablet by mouth daily. 10/11/15   Randall Hiss, MD  emtricitabine-tenofovir AF (DESCOVY) 200-25 MG tablet Take 1 tablet by mouth daily.  10/10/15   Thermon Leyland, NP  hydrOXYzine (ATARAX/VISTARIL) 25 MG tablet Take 1 tablet (25 mg total) by mouth at bedtime as needed for anxiety. 09/28/15   Adonis Brook, NP  ibuprofen (ADVIL,MOTRIN) 800 MG tablet Take 1 tablet (800 mg total) by mouth 3 (three) times daily. 12/11/15   Laberta Wilbon C Revel Stellmach, PA-C  lamoTRIgine (LAMICTAL) 25 MG tablet Take 1 tablet (25 mg total) by mouth 2 (two) times daily. 10/10/15   Thermon Leyland, NP  metoprolol succinate (TOPROL-XL) 25 MG 24 hr tablet Take 1 tablet (25 mg total) by mouth at bedtime. For high blood pressure 09/28/15   Adonis Brook, NP  predniSONE (DELTASONE) 20 MG tablet Take 2 tablets (40 mg total) by mouth daily. 10/17/15   Gavin Pound, MD  spironolactone (ALDACTONE) 25 MG tablet Take 0.5 tablets (12.5 mg total) by mouth daily. For heart condition 12/06/15   Jaclyn Shaggy, MD  traZODone (DESYREL) 150 MG tablet Take 1 tablet (150 mg total) by mouth at bedtime. 10/10/15   Thermon Leyland, NP  valACYclovir (VALTREX) 500 MG tablet Take 1 tablet (500 mg total) by mouth daily. For Herpes. 09/28/15   Adonis Brook, NP   BP 108/80 mmHg  Pulse 90  Temp(Src) 98.2 F (36.8 C) (Oral)  Resp 18  Ht  (1.753 m)  Wt 86.183 kg  BMI 28.05 kg/m2  SpO2 96% Physical Exam  Constitutional: He appears well-developed and well-nourished. No distress.  HENT:  Head: Normocephalic and atraumatic.  Eyes: Conjunctivae are normal. Pupils are equal, round, and reactive to light.  Neck: Normal range of motion. Neck supple.  Cardiovascular: Normal rate, regular rhythm and normal heart sounds.   Pulmonary/Chest: Effort normal and breath sounds normal. No respiratory distress.  Abdominal: Soft. Bowel sounds are normal.  Genitourinary: Penis normal. Cremasteric reflex is present. Right testis shows no mass, no swelling and no tenderness. Left testis shows tenderness. Left testis shows no mass and no swelling. Circumcised. No penile erythema or penile tenderness. No discharge  found.  Tenderness to left testicle with no discernible swelling or redness. No lesions noted. No penile discharge expressed.   Lymphadenopathy:    He has no cervical adenopathy.  Neurological: He is alert.  Skin: Skin is warm and dry. He is not diaphoretic.  Nursing note and vitals reviewed.   ED Course  Procedures (including critical care time) Labs Review Labs Reviewed  RPR  HIV ANTIBODY (ROUTINE TESTING)  GC/CHLAMYDIA PROBE AMP ([place]) NOT AT [facility]    Imaging Review US Scrotum  12/11/2015  CLINICAL DATA:  Left scrotal pain for 2 days EXAM: SCROTAL ULTRASOUND DOPPLER ULTRASOUND OF THE TESTICLES TECHNIQUE: Complete ultrasound examination of the testicles, epididymis, and other scrotal structures was performed. Color and spectral Doppler ultrasound were also utilized to evaluate blood flow to the testicles. COMPARISON:  None. FINDINGS: Right testicle Measurements: 4.3 x 1.8 x 2.7 cm. No mass  or microlithiasis visualized. Left testicle Measurements: 4.9 x 1.8 x 3.3 cm. No mass or microlithiasis visualized. Right epididymis:  Normal in size and appearance. Left epididymis:  Normal in size and appearance. Hydrocele:  Trace bilateral hydroceles. Varicocele:  None visualized. Pulsed Doppler interrogation of both testes demonstrates normal low resistance arterial and venous waveforms bilaterally. IMPRESSION: No acute finding by ultrasound. No testicular abnormality or evidence of torsion. Trace hydroceles. Electronically Signed   By: Judie Petit.  Shick M.D.   On: 12/11/2015 09:31   [place] Art/ven Flow Abd Pelv Doppler  12/11/2015  CLINICAL DATA:  Left scrotal pain for 2 days EXAM: SCROTAL ULTRASOUND DOPPLER ULTRASOUND OF THE TESTICLES TECHNIQUE: Complete ultrasound examination of the testicles, epididymis, and other scrotal structures was performed. Color and spectral Doppler ultrasound were also utilized to evaluate blood flow to the testicles. COMPARISON:  None. FINDINGS: Right testicle Measurements:  4.3 x 1.8 x 2.7 cm. No mass or microlithiasis visualized. Left testicle Measurements: 4.9 x 1.8 x 3.3 cm. No mass or microlithiasis visualized. Right epididymis:  Normal in size and appearance. Left epididymis:  Normal in size and appearance. Hydrocele:  Trace bilateral hydroceles. Varicocele:  None visualized. Pulsed Doppler interrogation of both testes demonstrates normal low resistance arterial and venous waveforms bilaterally. IMPRESSION: No acute finding by ultrasound. No testicular abnormality or evidence of torsion. Trace hydroceles. Electronically Signed   By: Judie Petit.  Shick M.D.   On: 12/11/2015 09:31   I have personally reviewed and evaluated these images and lab results as part of my medical decision-making.   EKG Interpretation None      MDM   Final diagnoses:  STD exposure  Testicular pain, left    Ryan Long presents with exposure to STD, penile discharge, and left testicular pain.  Patient symptomatic for possible STD. Patient has a history of HIV and his last viral load check was in September 2016 and was "undetectable." Patient is afebrile, not tachycardic, normotensive, nontoxic-appearing, and in no apparent distress. Ultrasound to rule out epididymitis due to testicular pain. Patient to be treated prophylactically for STDs. Ultrasounds shows no evidence of torsion, varicocele, epididymitis, or any other significant abnormalities. Results of the ultrasound as well as the plan of care were shared with the patient, as well as home care instructions and return precautions. Patient voiced understanding of these instructions, accepts the plan, and is comfortable with discharge.  Anselm Pancoast, PA-C 12/11/15 1610  Doug Sou, MD 12/11/15 [phone]

## 2015-12-17 LAB — HIV 1/2 AB DIFFERENTIATION
HIV 1 Ab: POSITIVE — AB
HIV 2 AB: NEGATIVE

## 2015-12-17 LAB — HIV ANTIBODY (ROUTINE TESTING W REFLEX)

## 2015-12-18 LAB — GC/CHLAMYDIA PROBE AMP (~~LOC~~) NOT AT ARMC
Chlamydia: NEGATIVE
Neisseria Gonorrhea: NEGATIVE

## 2016-01-11 DIAGNOSIS — Z5900 Homelessness unspecified: Secondary | ICD-10-CM | POA: Insufficient documentation

## 2016-01-17 DIAGNOSIS — K5900 Constipation, unspecified: Secondary | ICD-10-CM | POA: Insufficient documentation

## 2016-02-28 ENCOUNTER — Other Ambulatory Visit: Payer: Self-pay

## 2016-03-07 ENCOUNTER — Other Ambulatory Visit: Payer: Self-pay

## 2016-03-13 ENCOUNTER — Ambulatory Visit: Payer: Self-pay | Admitting: Infectious Disease

## 2016-03-17 ENCOUNTER — Other Ambulatory Visit: Payer: Self-pay | Admitting: *Deleted

## 2016-03-17 DIAGNOSIS — B009 Herpesviral infection, unspecified: Secondary | ICD-10-CM

## 2016-03-17 IMAGING — CT CT HEAD W/O CM
2 series · 15 of 30 positions shown, 17 images · non-contrast
Comparison: None.

CLINICAL DATA: Headaches with nausea and vomiting, history of HIV
positivity and bipolar disorder

EXAM:
CT HEAD WITHOUT CONTRAST
TECHNIQUE: Contiguous axial images were obtained from the base of the skull
through the vertex without intravenous contrast.

[Series 2: head without · axial · non-contrast · 0.44mm/px · z∈[+1378,+1498]mm · 7 of 33 slices shown, 9 images]
[im 5/33  brain]
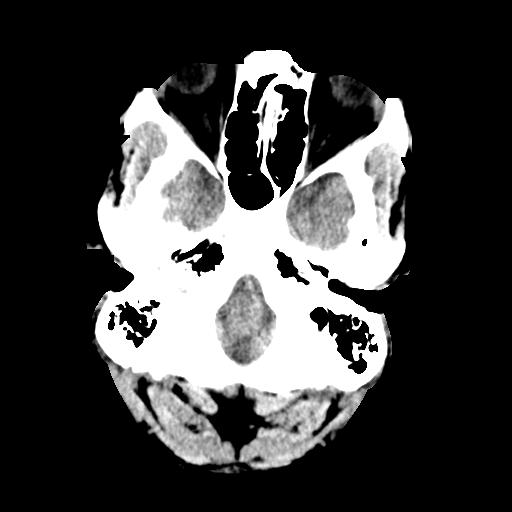
[im 5/33  bone]
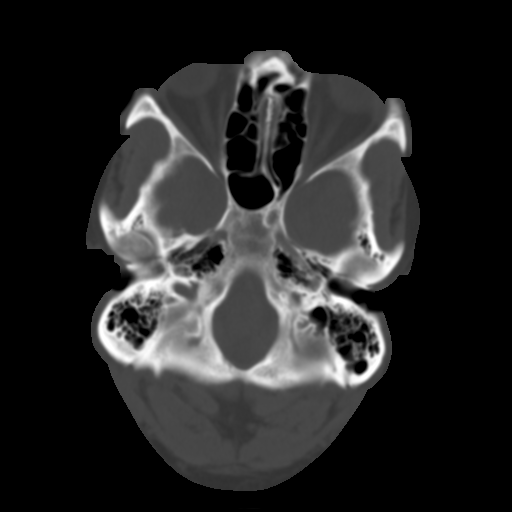
[im 9/33  brain]
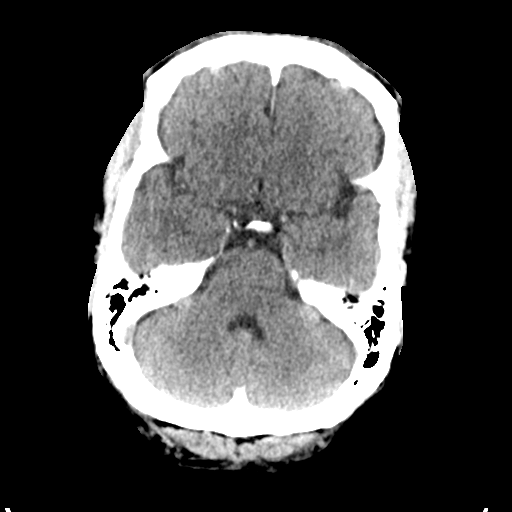
[im 13/33  brain]
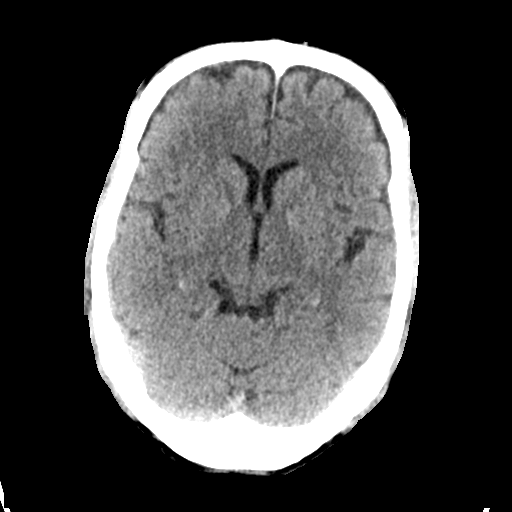
[im 17/33  brain]
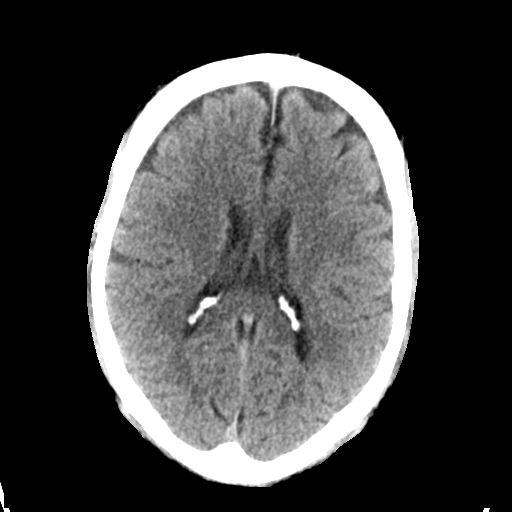
[im 21/33  brain]
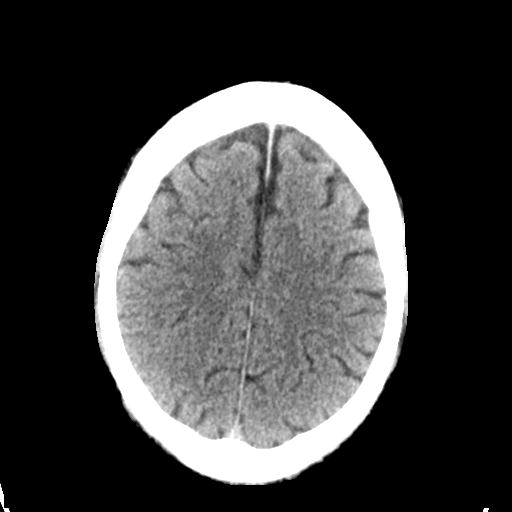
[im 21/33  bone]
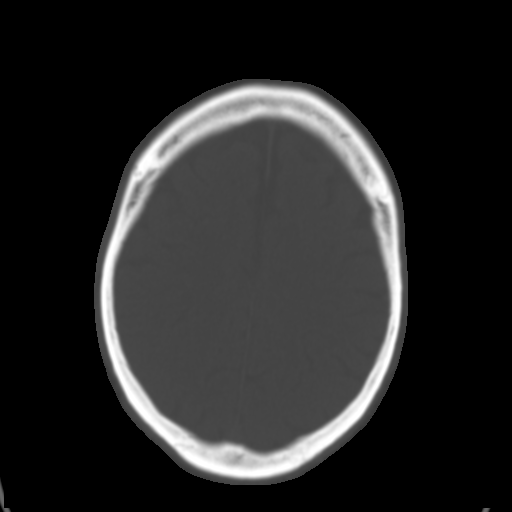
[im 25/33  brain]
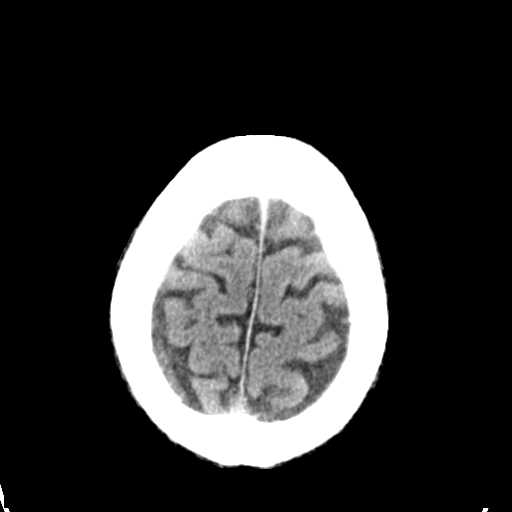
[im 29/33  brain]
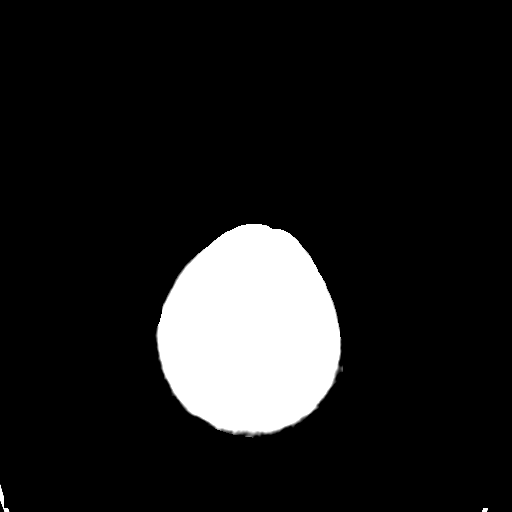

[Series 3: head bone · axial · 0.44mm/px · z∈[+1374,+1502]mm · 8 of 81 slices shown]
[im 9/81  bone]
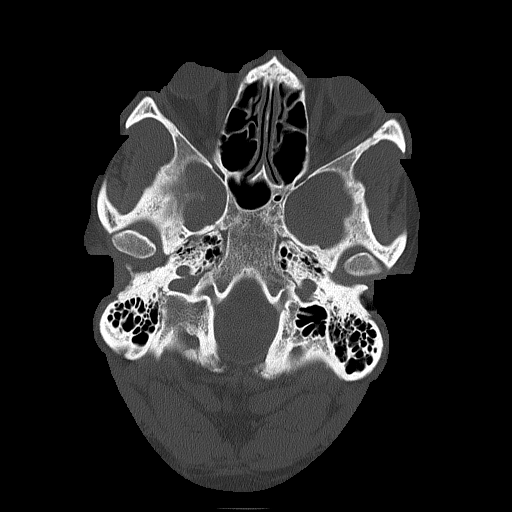
[im 17/81  bone]
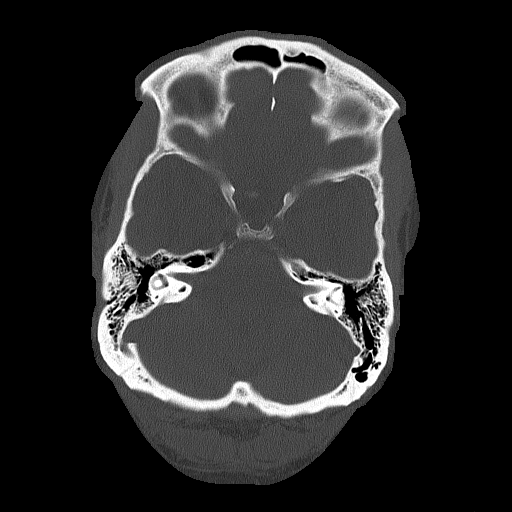
[im 25/81  bone]
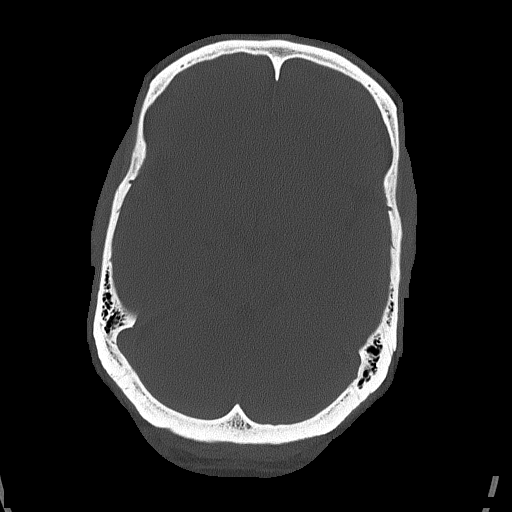
[im 37/81  bone]
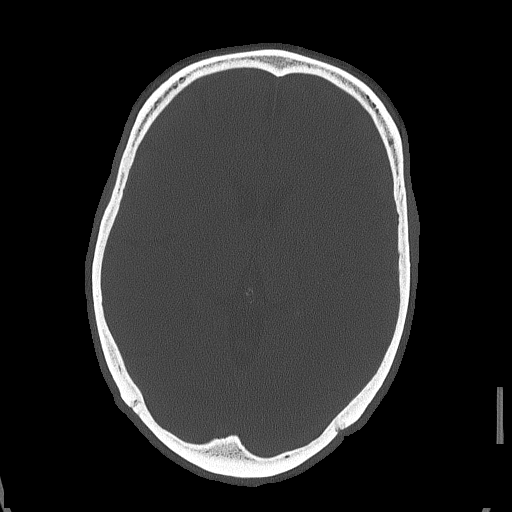
[im 45/81  bone]
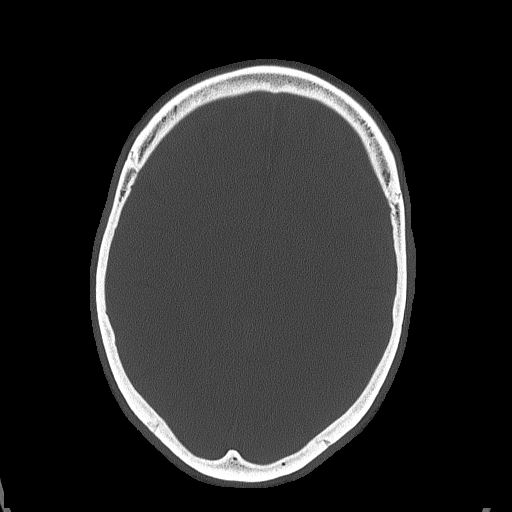
[im 57/81  bone]
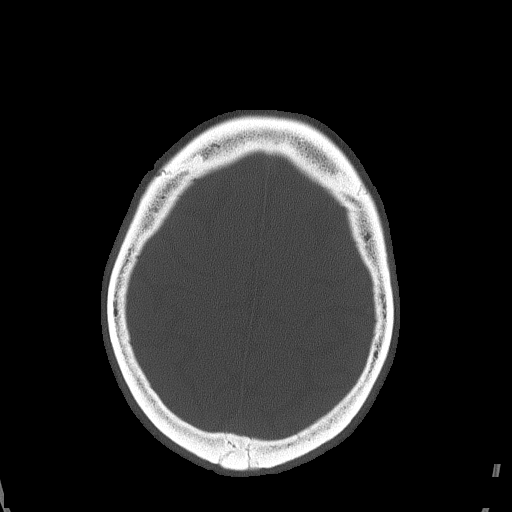
[im 65/81  bone]
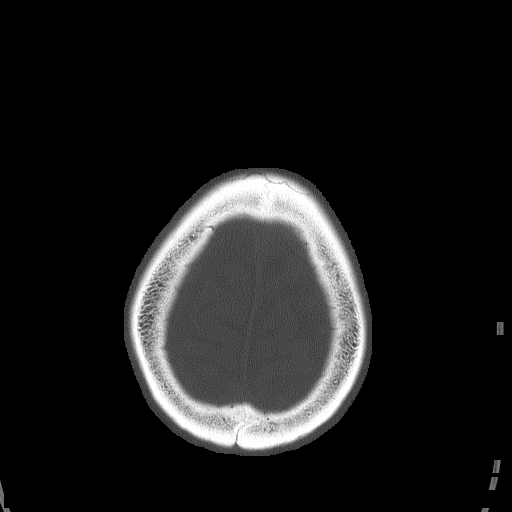
[im 73/81  bone]
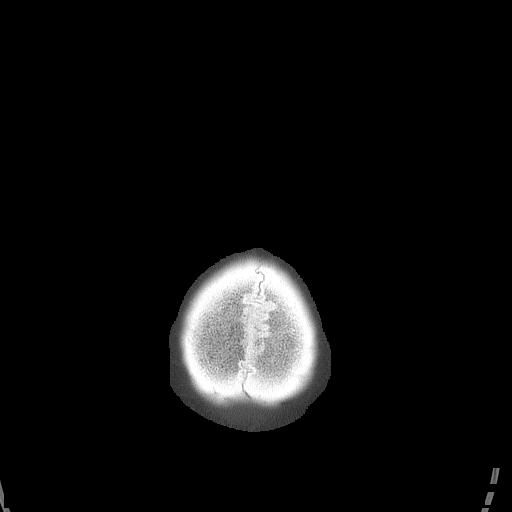

[15 of 30 positions shown; findings below may reference images not displayed]

FINDINGS: The bony calvarium is intact. No gross soft tissue abnormality is
noted. No acute hemorrhage, acute infarction or space-occupying mass
lesion is identified.
IMPRESSION: No acute abnormality noted.

## 2016-03-17 IMAGING — DX DG CHEST 2V
2 series · 2 of 2 positions shown · non-contrast
Comparison: August 13, 2015.

CLINICAL DATA: Chest pain.

EXAM:
CHEST  2 VIEW

[w chest pa]
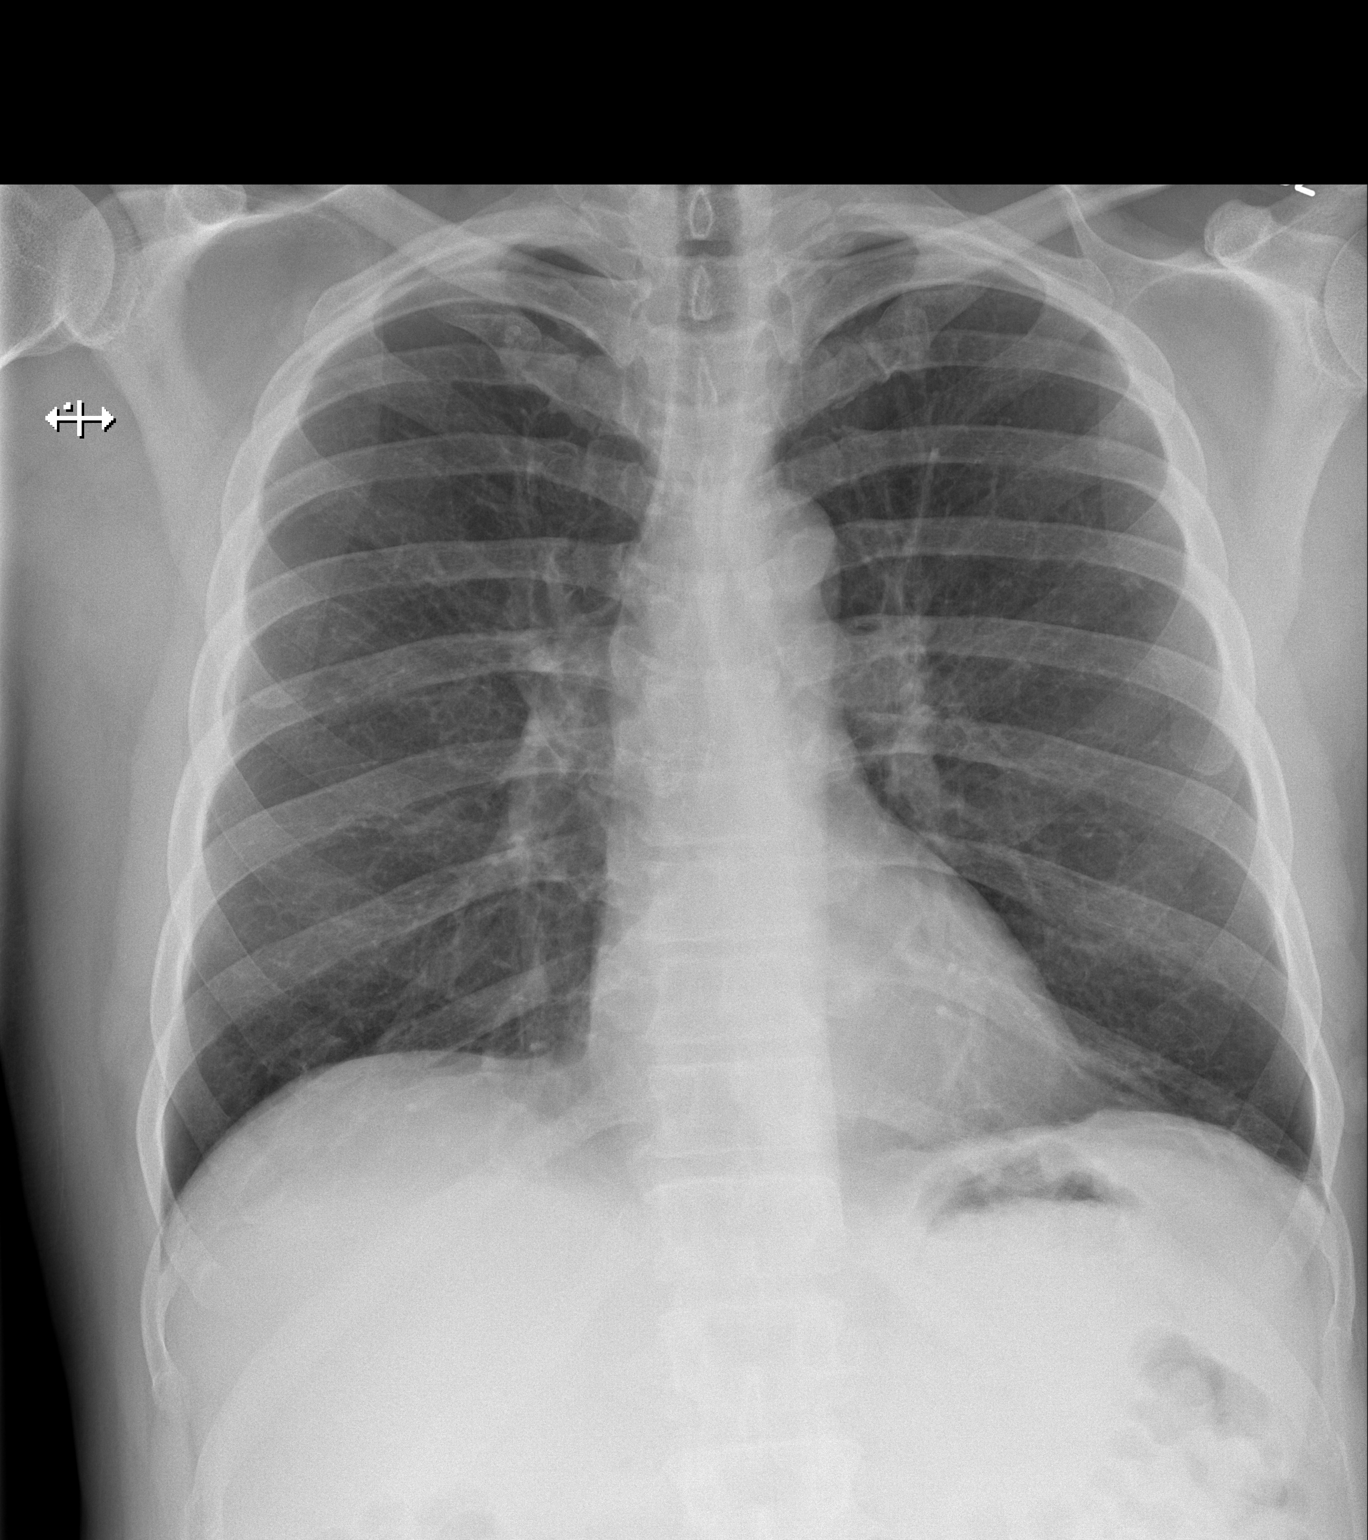

[w chest lat]
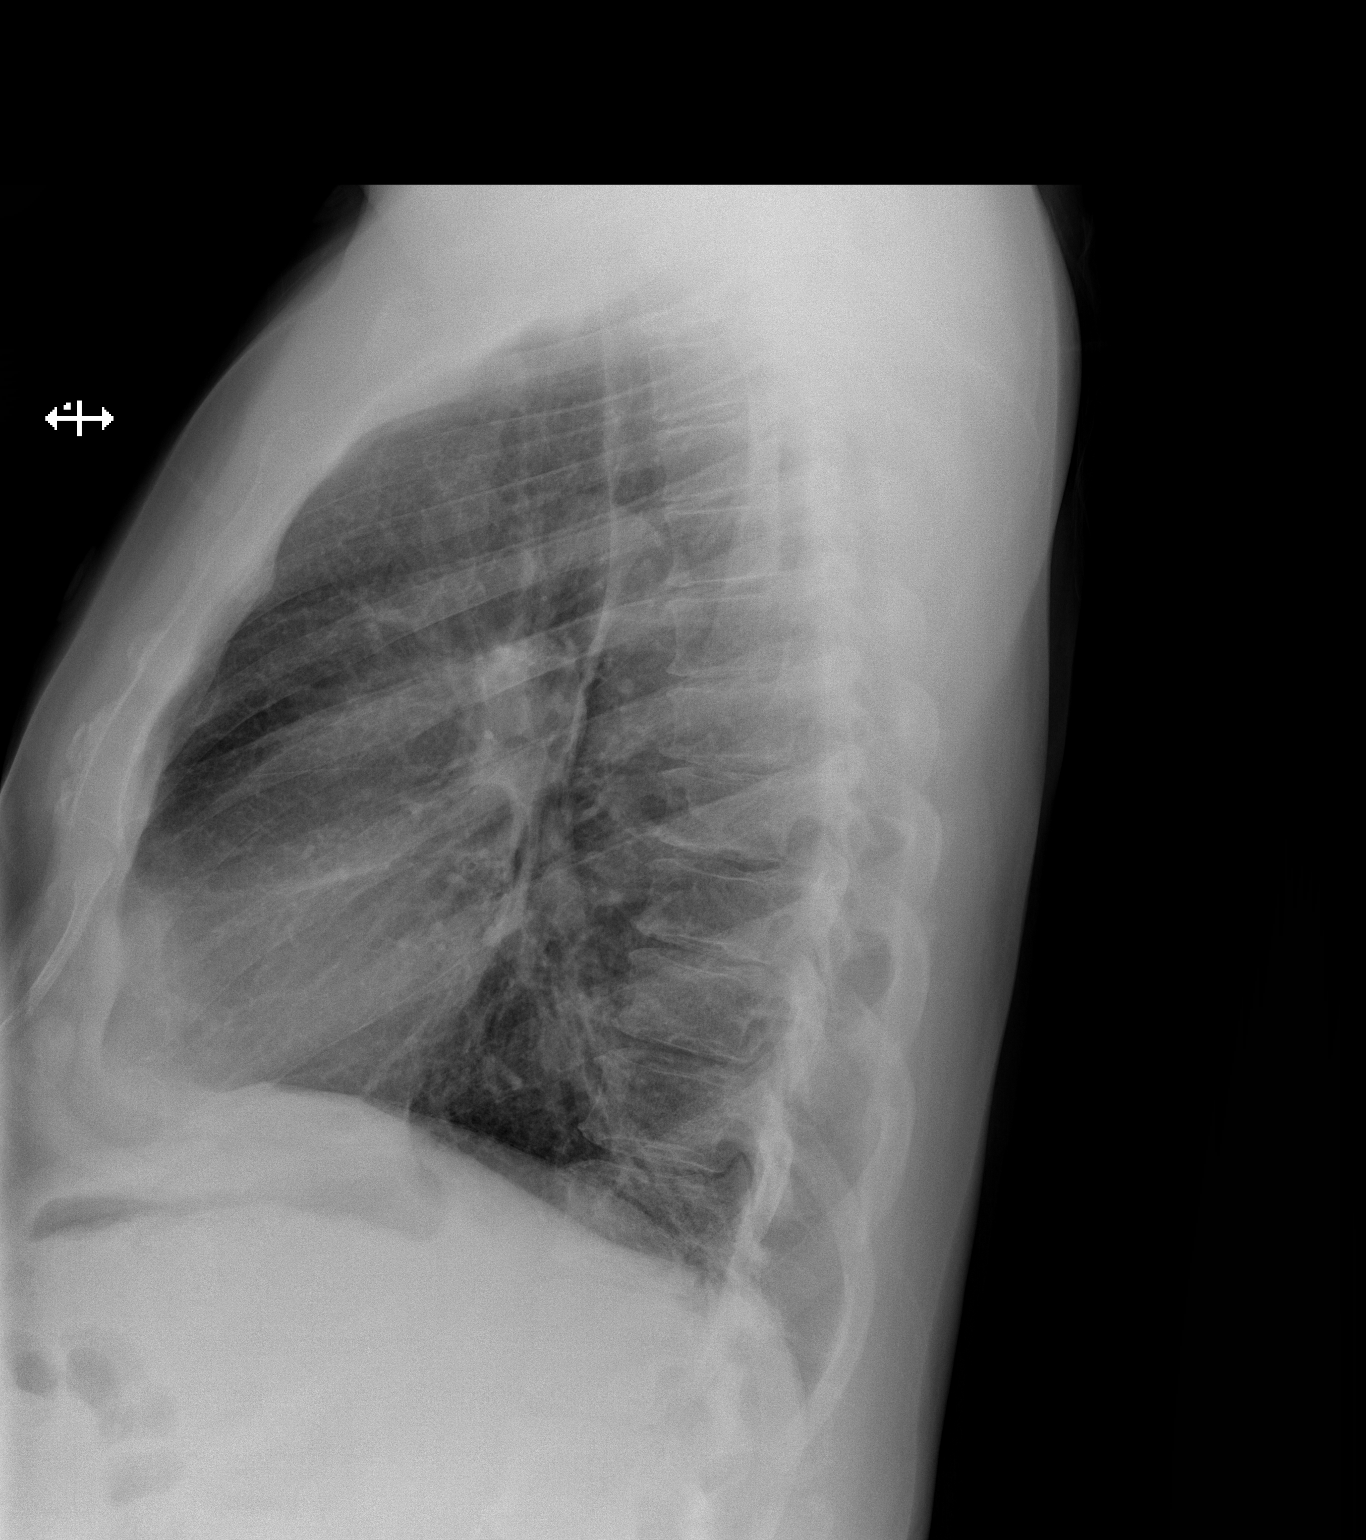

[2 of 2 positions shown; findings below may reference images not displayed]

FINDINGS: The heart size and mediastinal contours are within normal limits.
Both lungs are clear. No pneumothorax or pleural effusion is noted.
The visualized skeletal structures are unremarkable.
IMPRESSION: No active cardiopulmonary disease.

## 2016-03-17 MED ORDER — VALACYCLOVIR HCL 500 MG PO TABS: 500.0000 mg | ORAL_TABLET | Freq: Every day | ORAL | Status: DC

## 2016-04-05 ENCOUNTER — Ambulatory Visit: Payer: Self-pay | Admitting: Infectious Disease

## 2016-04-14 DIAGNOSIS — F1911 Other psychoactive substance abuse, in remission: Secondary | ICD-10-CM | POA: Insufficient documentation

## 2016-04-14 DIAGNOSIS — Z9151 Personal history of suicidal behavior: Secondary | ICD-10-CM | POA: Insufficient documentation

## 2016-04-14 DIAGNOSIS — F1021 Alcohol dependence, in remission: Secondary | ICD-10-CM | POA: Insufficient documentation

## 2016-04-14 DIAGNOSIS — Z915 Personal history of self-harm: Secondary | ICD-10-CM | POA: Insufficient documentation

## 2016-04-18 ENCOUNTER — Ambulatory Visit (INDEPENDENT_AMBULATORY_CARE_PROVIDER_SITE_OTHER): Payer: Medicaid Other | Admitting: Internal Medicine

## 2016-04-18 ENCOUNTER — Encounter: Payer: Self-pay | Admitting: Internal Medicine

## 2016-04-18 ENCOUNTER — Other Ambulatory Visit (HOSPITAL_COMMUNITY)
Admission: RE | Admit: 2016-04-18 | Discharge: 2016-04-18 | Disposition: A | Discharge status: Home / Self Care | Payer: Medicaid Other | Source: Ambulatory Visit | Attending: Internal Medicine | Admitting: Internal Medicine

## 2016-04-18 VITALS — BP 111/79 | HR 116 | Temp 98.0°F | Wt 200.0 lb

## 2016-04-18 DIAGNOSIS — F17200 Nicotine dependence, unspecified, uncomplicated: Secondary | ICD-10-CM

## 2016-04-18 DIAGNOSIS — Z113 Encounter for screening for infections with a predominantly sexual mode of transmission: Secondary | ICD-10-CM

## 2016-04-18 DIAGNOSIS — B009 Herpesviral infection, unspecified: Secondary | ICD-10-CM

## 2016-04-18 DIAGNOSIS — Z79899 Other long term (current) drug therapy: Secondary | ICD-10-CM

## 2016-04-18 DIAGNOSIS — B20 Human immunodeficiency virus [HIV] disease: Secondary | ICD-10-CM

## 2016-04-18 LAB — LIPID PANEL
Cholesterol: 150 mg/dL (ref 125–200)
HDL: 61 mg/dL (ref >=40)
LDL CALC: 50 mg/dL (ref <130)
TRIGLYCERIDES: 193 mg/dL — AB (ref <150)
Total CHOL/HDL Ratio: 2.5 Ratio (ref <=5.0)
VLDL: 39 mg/dL — AB (ref <30)

## 2016-04-18 LAB — COMPLETE METABOLIC PANEL WITH GFR
ALT: 24 U/L (ref 9–46)
AST: 21 U/L (ref 10–35)
Albumin: 3.8 g/dL (ref 3.6–5.1)
Alkaline Phosphatase: 40 U/L (ref 40–115)
BUN: 15 mg/dL (ref 7–25)
CALCIUM: 8.8 mg/dL (ref 8.6–10.3)
CHLORIDE: 108 mmol/L (ref 98–110)
CO2: 24 mmol/L (ref 20–31)
CREATININE: 1.22 mg/dL (ref 0.70–1.33)
GFR, Est African American: 77 mL/min (ref >=60)
GFR, Est Non African American: 67 mL/min (ref >=60)
GLUCOSE: 115 mg/dL — AB (ref 65–99)
POTASSIUM: 3.6 mmol/L (ref 3.5–5.3)
SODIUM: 140 mmol/L (ref 135–146)
Total Bilirubin: 0.8 mg/dL (ref 0.2–1.2)
Total Protein: 5.6 g/dL — ABNORMAL LOW (ref 6.1–8.1)

## 2016-04-18 LAB — CBC WITH DIFFERENTIAL/PLATELET
BASOS ABS: 0 {cells}/uL (ref 0–200)
Basophils Relative: 0 %
EOS ABS: 0 {cells}/uL — AB (ref 15–500)
Eosinophils Relative: 0 %
HEMATOCRIT: 48.7 % (ref 38.5–50.0)
HEMOGLOBIN: 17 g/dL (ref 13.2–17.1)
LYMPHS ABS: 1776 {cells}/uL (ref 850–3900)
LYMPHS PCT: 37 %
MCH: 33.1 pg — AB (ref 27.0–33.0)
MCHC: 34.9 g/dL (ref 32.0–36.0)
MCV: 94.9 fL (ref 80.0–100.0)
MONO ABS: 384 {cells}/uL (ref 200–950)
MPV: 9.5 fL (ref 7.5–12.5)
Monocytes Relative: 8 %
NEUTROS PCT: 55 %
Neutro Abs: 2640 cells/uL (ref 1500–7800)
Platelets: 163 10*3/uL (ref 140–400)
RBC: 5.13 MIL/uL (ref 4.20–5.80)
RDW: 14.4 % (ref 11.0–15.0)
WBC: 4.8 10*3/uL (ref 3.8–10.8)

## 2016-04-18 MED ORDER — DOLUTEGRAVIR SODIUM 50 MG PO TABS: 50.0000 mg | ORAL_TABLET | Freq: Every day | ORAL | Status: DC

## 2016-04-18 MED ORDER — EMTRICITABINE-TENOFOVIR AF 200-25 MG PO TABS: 1.0000 | ORAL_TABLET | Freq: Every day | ORAL | Status: DC

## 2016-04-18 MED ORDER — VALACYCLOVIR HCL 500 MG PO TABS: 500.0000 mg | ORAL_TABLET | Freq: Every day | ORAL | Status: DC

## 2016-04-18 NOTE — Assessment & Plan Note (Signed)
Labs today and rtc 4 months unless concerns.  

## 2016-04-18 NOTE — Progress Notes (Signed)
Patient ID: Ryan Long, male   DOB: Jul 18, 1961, 55 y.o.   MRN: [id number] CC: Follow up for HIV  Interval history: Currently is asymptomatic and well-controlled on Tivicay and Descovy.  Due to lack of visits, he had been out for a while but back on now.  His last CD4 was 590 and viral load undetectable in October 2016.  Since last visit he has had no new problems.  Continues to have pain in his right shoulder.  Has no associated wieght loss or diarrhea.  Denies any missed doses when he is on the medication.      Also has HF and going to his cardiologist tomorrow and seeing his PCP next week for his shoulder pain.   Prior to Admission medications   Medication Sig Start Date End Date Taking? Authorizing Provider  albuterol (PROVENTIL HFA;VENTOLIN HFA) 108 (90 BASE) MCG/ACT inhaler Inhale 2 puffs into the lungs every 6 (six) hours as needed for wheezing or shortness of breath. 11/09/15  Yes Jaclyn Shaggy, MD  aspirin 81 MG chewable tablet Chew 1 tablet (81 mg total) by mouth every morning. For heart health 09/28/15  Yes Adonis Brook, NP  digoxin (LANOXIN) 0.125 MG tablet Take 1 tablet (0.125 mg total) by mouth daily. For control of heart arrythmia. 11/04/15  Yes Laurey Morale, MD  dolutegravir (TIVICAY) 50 MG tablet Take 1 tablet (50 mg total) by mouth daily. 10/10/15  Yes Thermon Leyland, NP  emtricitabine-tenofovir AF (DESCOVY) 200-25 MG tablet Take 1 tablet by mouth daily. 10/10/15  Yes Thermon Leyland, NP  hydrOXYzine (ATARAX/VISTARIL) 25 MG tablet Take 1 tablet (25 mg total) by mouth at bedtime as needed for anxiety. 09/28/15  Yes Adonis Brook, NP  ibuprofen (ADVIL,MOTRIN) 800 MG tablet Take 1 tablet (800 mg total) by mouth 3 (three) times daily. 12/11/15  Yes Shawn C Joy, PA-C  traZODone (DESYREL) 150 MG tablet Take 1 tablet (150 mg total) by mouth at bedtime. 10/10/15  Yes Thermon Leyland, NP  valACYclovir (VALTREX) 500 MG tablet Take 1 tablet (500 mg total) by mouth daily. For Herpes. 03/17/16   Yes Randall Hiss, MD  buPROPion [facility] SR) 150 MG 12 hr tablet Take 1 tablet (150 mg total) by mouth 2 (two) times daily. Patient not taking: Reported on 04/18/2016 10/10/15   Thermon Leyland, NP  doxycycline (VIBRAMYCIN) 100 MG capsule Take 1 capsule (100 mg total) by mouth 2 (two) times daily. Patient not taking: Reported on 04/18/2016 12/11/15   Anselm Pancoast, PA-C  lamoTRIgine (LAMICTAL) 25 MG tablet Take 1 tablet (25 mg total) by mouth 2 (two) times daily. Patient not taking: Reported on 04/18/2016 10/10/15   Thermon Leyland, NP  metoprolol succinate (TOPROL-XL) 25 MG 24 hr tablet Take 1 tablet (25 mg total) by mouth at bedtime. For high blood pressure Patient not taking: Reported on 04/18/2016 09/28/15   Adonis Brook, NP  predniSONE (DELTASONE) 20 MG tablet Take 2 tablets (40 mg total) by mouth daily. Patient not taking: Reported on 04/18/2016 10/17/15   Gavin Pound, MD  spironolactone (ALDACTONE) 25 MG tablet Take 0.5 tablets (12.5 mg total) by mouth daily. For heart condition Patient not taking: Reported on 04/18/2016 12/06/15   Jaclyn Shaggy, MD    Review of Systems Constitutional: negative for fevers, chills and fatigue Cardiovascular: negative for chest pain, dyspnea, fatigue Musculoskeletal: negative for muscle weakness All other systems reviewed and are negative   Physical Exam: CONSTITUTIONAL:in no apparent distress and alert  Filed Vitals:   04/18/16 1503  Temp: 98 F (36.7 C)   Eyes: anicteric HENT: no thrush, no cervical lymphadenopathy Respiratory: Normal respiratory effort; CTA B Cardiovascular: RRR without m  Lab Results  Component Value Date   HIV1RNAQUANT <20 10/13/2015   HIV1RNAQUANT <20 06/01/2015   HIV1RNAQUANT <20 12/02/2013   No components found for: HIV1GENOTYPRPLUS No components found for: THELPERCELL  Social History   Social History  . Marital Status: Single    Spouse Name: N/A  . Number of Children: N/A  . Years of Education: N/A    Occupational History  . Not on file.   Social History Main Topics  . Smoking status: Current Every Day Smoker -- 0.50 packs/day for 30 years    Types: Cigarettes  . Smokeless tobacco: Never Used     Comment: 15 cigarettes daily  . Alcohol Use: 3.6 oz/week    6 Cans of beer per week     Comment: reports drinks 6+ beers 3 times a month  . Drug Use: Yes    Special: Cocaine     Comment: reports uses crack cocaine 3 times a month  . Sexual Activity: Yes    Birth Control/ Protection: Condom     Comment: pt. given condoms   Other Topics Concern  . Not on file   Social History Narrative

## 2016-04-18 NOTE — Assessment & Plan Note (Signed)
I discussed cessation.  He is down to about 10-15 per day.

## 2016-04-18 NOTE — Assessment & Plan Note (Signed)
Will refill Valtrex suppression

## 2016-04-19 ENCOUNTER — Inpatient Hospital Stay (HOSPITAL_COMMUNITY): Admission: RE | Admit: 2016-04-19 | Payer: Self-pay | Source: Ambulatory Visit

## 2016-04-19 LAB — T-HELPER CELL (CD4) - (RCID CLINIC ONLY)
CD4 T CELL ABS: 710 /uL (ref 400–2700)
CD4 T CELL HELPER: 39 % (ref 33–55)

## 2016-04-19 LAB — HIV-1 RNA QUANT-NO REFLEX-BLD
HIV 1 RNA QUANT: 71 {copies}/mL — AB (ref <20)
HIV-1 RNA Quant, Log: 1.85 Log copies/mL — ABNORMAL HIGH (ref <1.30)

## 2016-04-19 LAB — RPR

## 2016-04-20 LAB — URINE CYTOLOGY ANCILLARY ONLY
Chlamydia: NEGATIVE
Neisseria Gonorrhea: NEGATIVE

## 2016-04-23 IMAGING — DX DG CHEST 2V
2 series · 2 of 2 positions shown · non-contrast
Comparison: None.

CLINICAL DATA: Shortness of breath, productive cough, nausea and
chills for 1 week.

EXAM:
CHEST  2 VIEW

[chest pa]
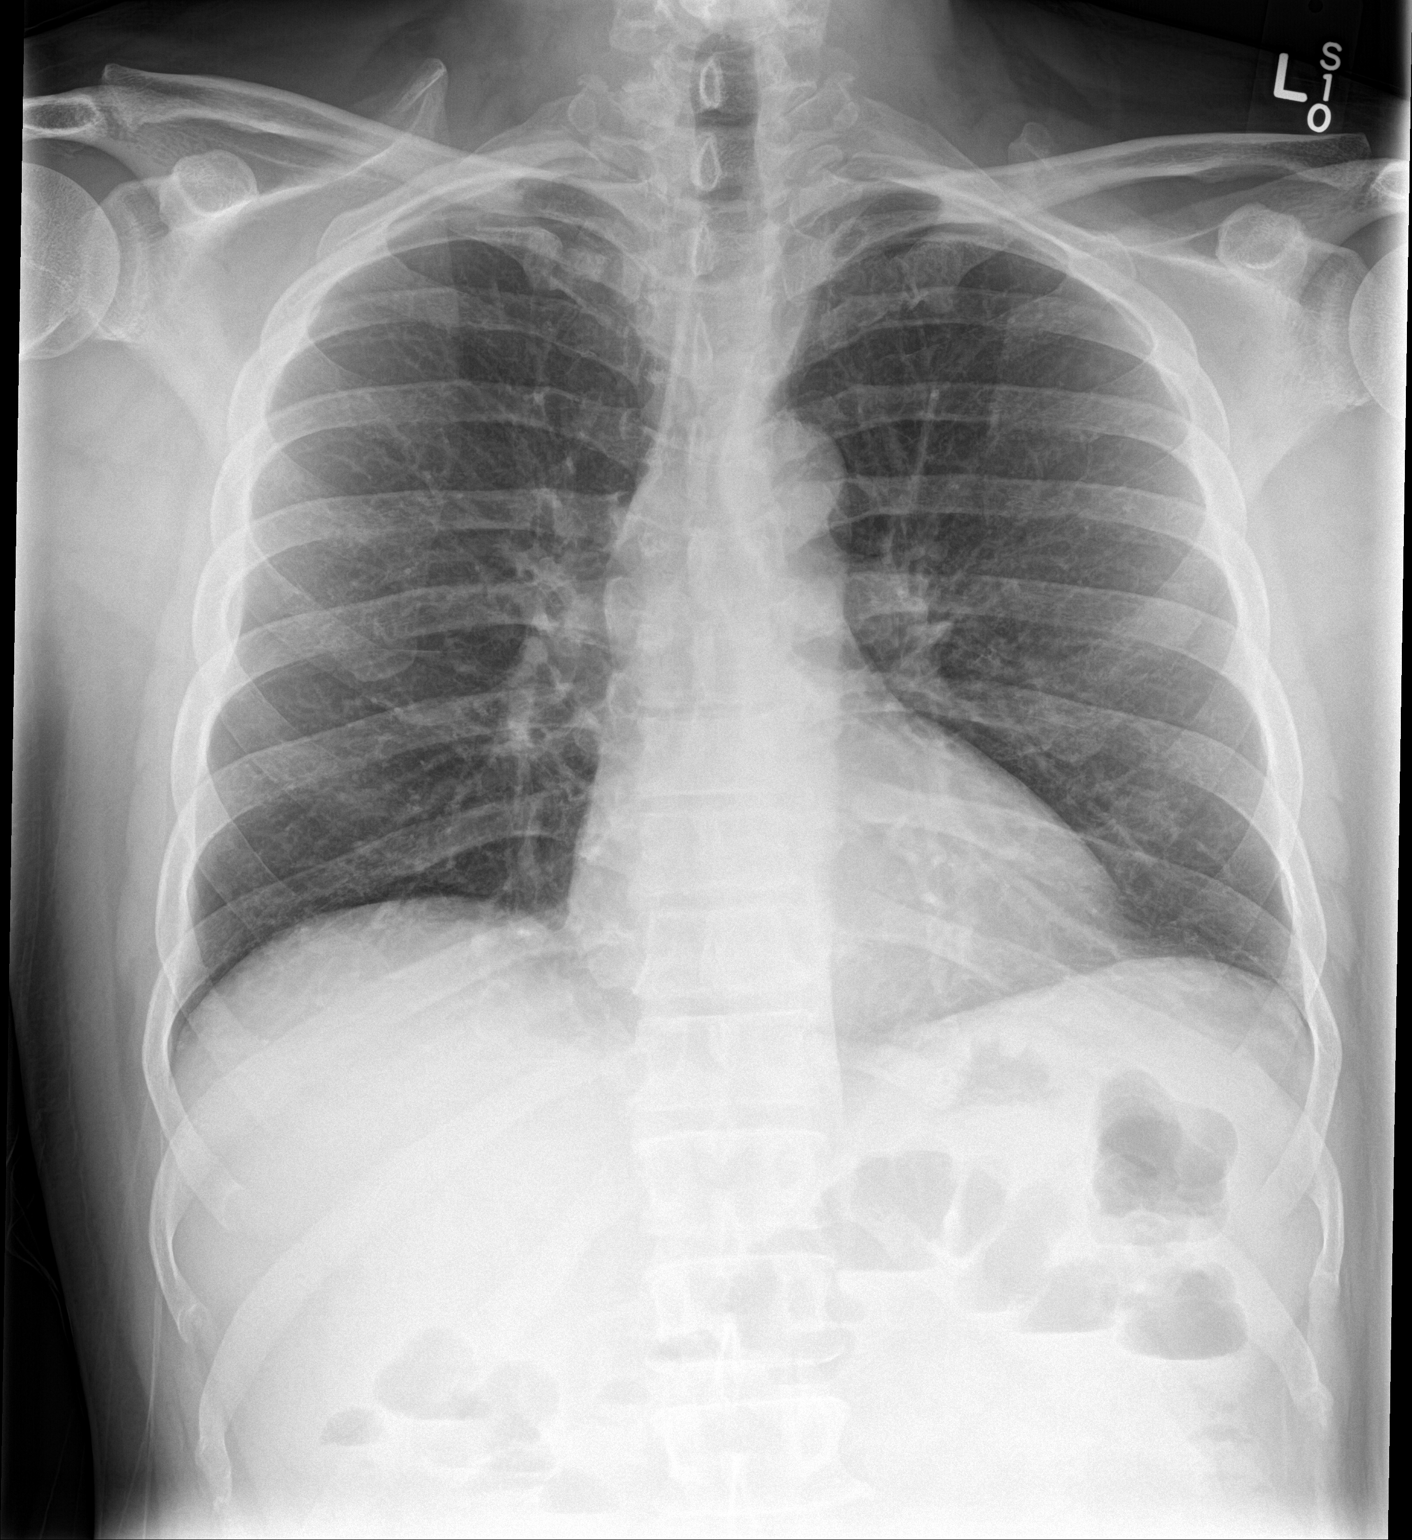

[chest lat]
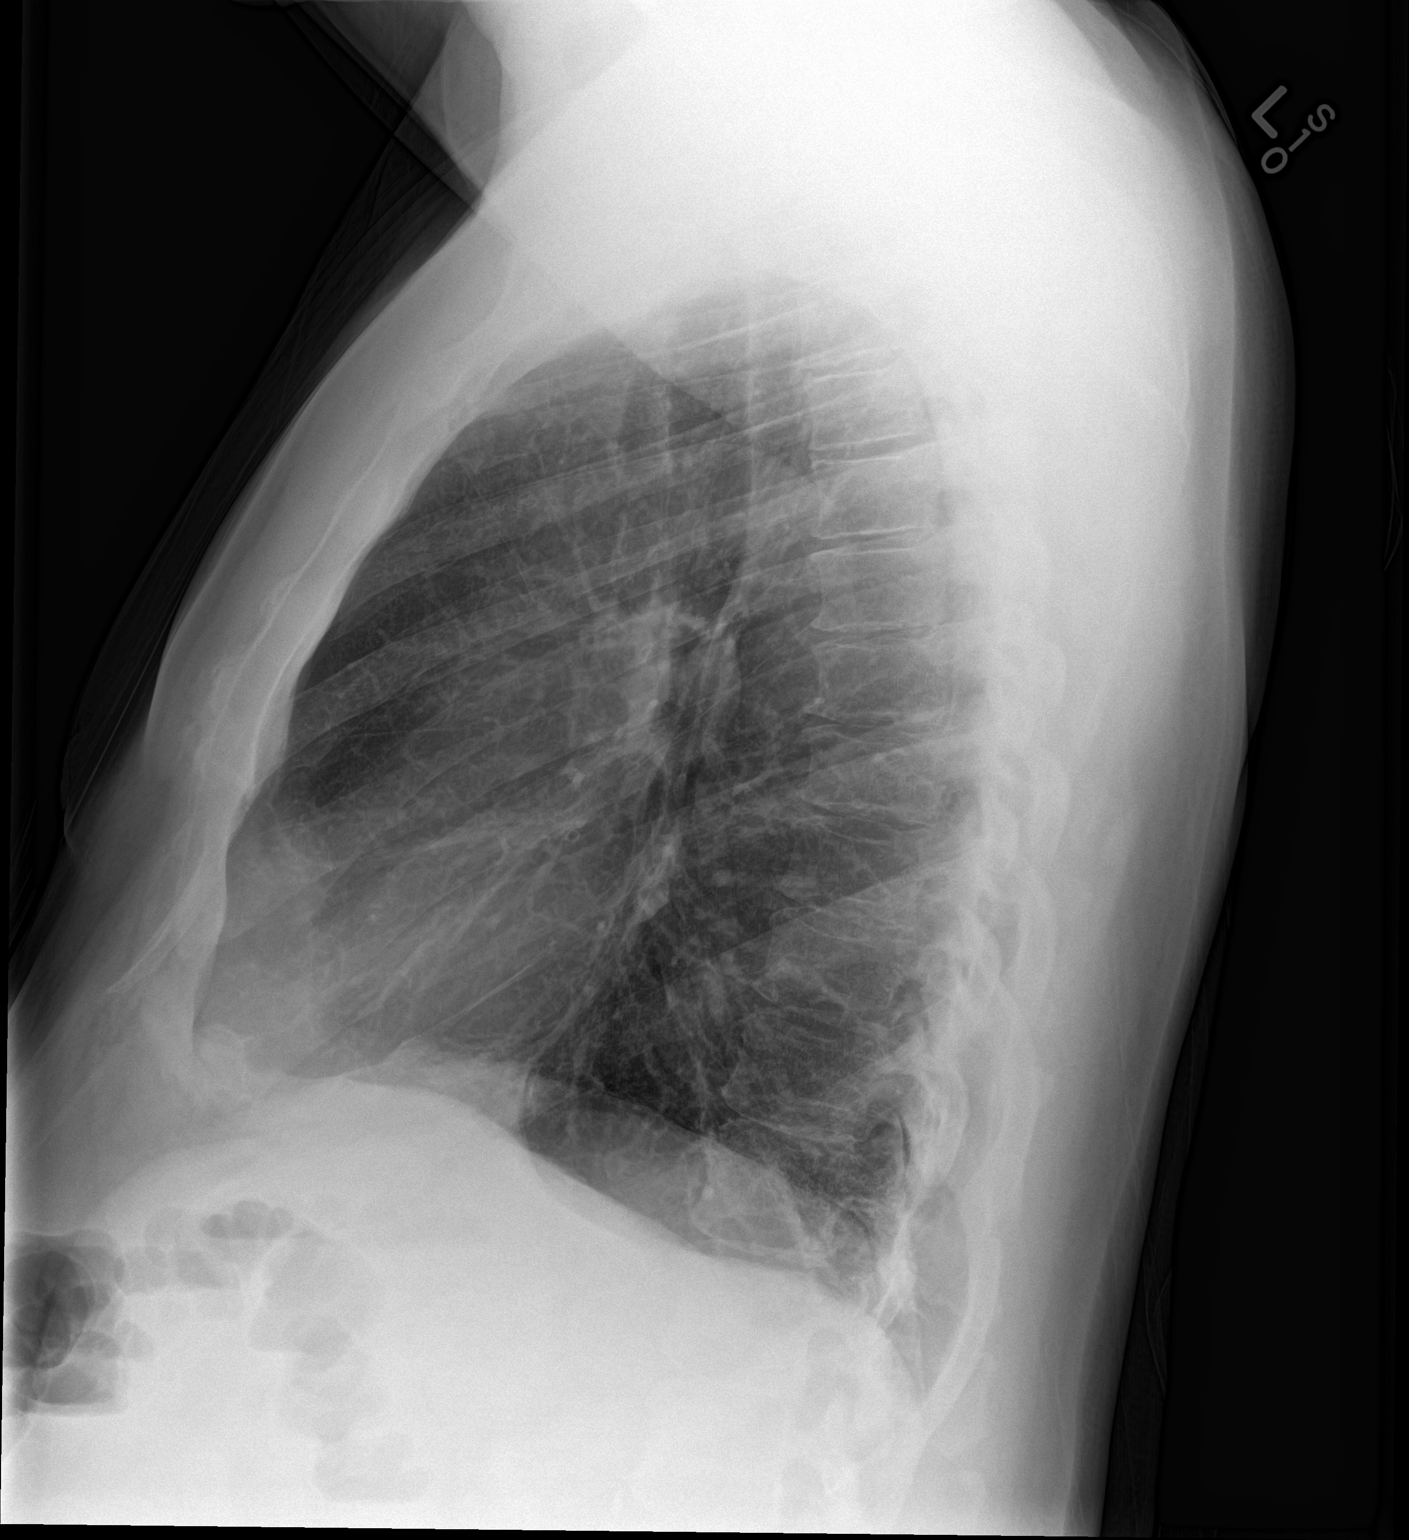

[2 of 2 positions shown; findings below may reference images not displayed]

FINDINGS: Heart size is normal. Overall cardiomediastinal silhouette is normal
in size and configuration. Lungs are hyperexpanded compatible with
the given history of COPD. Lungs are clear. No pleural effusion. No
pneumothorax. No acute osseous abnormality.
IMPRESSION: 1. Lungs are hyperexpanded consistent with history of COPD.
2. No evidence of acute cardiopulmonary abnormality. No pneumonia
seen.

## 2016-04-24 ENCOUNTER — Ambulatory Visit: Payer: Self-pay | Admitting: Family Medicine

## 2016-04-25 ENCOUNTER — Encounter (HOSPITAL_COMMUNITY): Payer: Self-pay

## 2016-06-15 ENCOUNTER — Other Ambulatory Visit: Payer: Self-pay | Admitting: Physician Assistant

## 2016-06-15 ENCOUNTER — Ambulatory Visit: Payer: Medicaid Other | Attending: Family Medicine | Admitting: Physician Assistant

## 2016-06-15 ENCOUNTER — Other Ambulatory Visit (HOSPITAL_COMMUNITY)
Admission: RE | Admit: 2016-06-15 | Discharge: 2016-06-15 | Disposition: A | Discharge status: Home / Self Care | Payer: Medicaid Other | Source: Ambulatory Visit | Attending: Family Medicine | Admitting: Family Medicine

## 2016-06-15 VITALS — BP 133/87 | HR 110 | Temp 98.5°F | Resp 16 | Ht 69.0 in | Wt 200.0 lb

## 2016-06-15 DIAGNOSIS — I5022 Chronic systolic (congestive) heart failure: Secondary | ICD-10-CM

## 2016-06-15 DIAGNOSIS — Z113 Encounter for screening for infections with a predominantly sexual mode of transmission: Secondary | ICD-10-CM | POA: Insufficient documentation

## 2016-06-15 DIAGNOSIS — R300 Dysuria: Secondary | ICD-10-CM | POA: Diagnosis not present

## 2016-06-15 DIAGNOSIS — A64 Unspecified sexually transmitted disease: Secondary | ICD-10-CM

## 2016-06-15 LAB — POCT URINALYSIS DIPSTICK
Bilirubin, UA: NEGATIVE
Blood, UA: NEGATIVE
GLUCOSE UA: NEGATIVE
KETONES UA: NEGATIVE
LEUKOCYTES UA: NEGATIVE
Nitrite, UA: NEGATIVE
PROTEIN UA: NEGATIVE
SPEC GRAV UA: 1.01
UROBILINOGEN UA: 0.2
pH, UA: 5.5

## 2016-06-15 MED ORDER — AZITHROMYCIN 250 MG PO TABS: ORAL_TABLET | ORAL | Status: DC

## 2016-06-15 MED ORDER — FUROSEMIDE 20 MG PO TABS: 20.0000 mg | ORAL_TABLET | Freq: Every day | ORAL | Status: DC

## 2016-06-15 MED ORDER — HYDROXYZINE HCL 25 MG PO TABS: ORAL_TABLET | ORAL | Status: DC

## 2016-06-15 NOTE — Progress Notes (Signed)
Patient ID: Ryan Long, male   DOB: [birth year], 55 y.o.   MRN: [id number]   Jsoeph Long, is a 55 y.o. male  PTY:[id number]  HDT:[id number]  DOB - 11-17-1961  Chief Complaint  Patient presents with  . Edema    Built up fluid in stomach onset 3-4 months, since off of lasix  . Penile Discharge    Still having, and burning.        Subjective:  Chief Complaint and HPI: Ryan Long is a 55 y.o. male here today for concern about a urinary STD.  Says he was tested and treated about 1 month ago in another city and doesn't think the infection went away because he still has some burning with urination and whitish penile discharge.  +HIV positive.  Some drug and alcohol use.  He says the STD he was treated for was in the urine; not syphilis.  He says he was treated with "4 pink pills."  He also c/o some swelling in his legs and abdomen that happens since he has been off lasix for chronic heart failure.  He denies CP or SOB. He also wants to increase his dose of hydroxyzine at bedtime.   Last CD4 count 710 in 04/2016.     ROS:   Constitutional:  No f/c, No night sweats, No unexplained weight loss. EENT:  No vision changes, No blurry vision, No hearing changes. No mouth, throat, or ear problems.  Respiratory: No cough, No SOB Cardiac: No CP, no palpitations GI:  No abd pain, No N/V/D. +bloating GU: +Urinary s/sx Musculoskeletal: No joint pain Neuro: No headache, no dizziness, no motor weakness.  Skin: No rash Endocrine:  No polydipsia. No polyuria.  Psych: Denies SI/HI  No problems updated.  ALLERGIES: No Known Allergies  PAST MEDICAL HISTORY: Past Medical History  Diagnosis Date  . HIV (human immunodeficiency virus infection) (HCC)   . Chronic systolic heart failure (HCC)   . Hypertension   . Genital herpes   . Active smoker   . AR (allergic rhinitis)   . HLD (hyperlipidemia)   . NICM (nonischemic cardiomyopathy) (HCC)     Echocardiogram 06/28/11: EF 30-35%, mild MR, mild LAE;   No CAD by coronary CT angiogram 3/12 at [facility]  . NSVT (nonsustained ventricular tachycardia) (HCC)   . Depression   . Bipolar 1 disorder (HCC)   . Alcohol abuse   . Crack cocaine use   . COPD (chronic obstructive pulmonary disease) (HCC)   . CHF (congestive heart failure) (HCC)   . Anxiety   . PTSD (post-traumatic stress disorder)     MEDICATIONS AT HOME: Prior to Admission medications   Medication Sig Start Date End Date Taking? Authorizing Provider  albuterol (PROVENTIL HFA;VENTOLIN HFA) 108 (90 BASE) MCG/ACT inhaler Inhale 2 puffs into the lungs every 6 (six) hours as needed for wheezing or shortness of breath. 11/09/15  Yes Jaclyn Shaggy, MD  aspirin 81 MG chewable tablet Chew 1 tablet (81 mg total) by mouth every morning. For heart health 09/28/15  Yes Adonis Brook, NP  digoxin (LANOXIN) 0.125 MG tablet Take 1 tablet (0.125 mg total) by mouth daily. For control of heart arrythmia. 11/04/15  Yes Laurey Morale, MD  dolutegravir (TIVICAY) 50 MG tablet Take 1 tablet (50 mg total) by mouth daily. 04/18/16  Yes Gardiner Barefoot, MD  emtricitabine-tenofovir AF (DESCOVY) 200-25 MG tablet Take 1 tablet by mouth daily. 04/18/16  Yes Gardiner Barefoot, MD  hydrOXYzine (ATARAX/VISTARIL) 25 MG tablet 1  to 2 tabs at night for sleep 06/15/16  Yes Anders Simmonds, PA-C  ibuprofen (ADVIL,MOTRIN) 800 MG tablet Take 1 tablet (800 mg total) by mouth 3 (three) times daily. 12/11/15  Yes Shawn C Joy, PA-C  metoprolol succinate (TOPROL-XL) 25 MG 24 hr tablet Take 1 tablet (25 mg total) by mouth at bedtime. For high blood pressure 09/28/15  Yes Adonis Brook, NP  Multiple Vitamins-Minerals (MULTIVITAMIN WITH MINERALS) tablet Take 1 tablet by mouth daily.   Yes Historical Provider, MD  spironolactone (ALDACTONE) 25 MG tablet Take 0.5 tablets (12.5 mg total) by mouth daily. For heart condition 12/06/15  Yes Jaclyn Shaggy, MD  valACYclovir (VALTREX) 500 MG tablet Take 1 tablet (500 mg total) by  mouth daily. For Herpes. 04/18/16  Yes Gardiner Barefoot, MD  azithromycin [facility]) 250 MG tablet Take all 4 tabs today 06/15/16   Anders Simmonds, PA-C  furosemide (LASIX) 20 MG tablet Take 1 tablet (20 mg total) by mouth daily. Until you see your cardiologist on July 11 06/15/16   Anders Simmonds, PA-C     Objective:  EXAM:   Filed Vitals:   06/15/16 1505  BP: 133/87  Pulse: 110  Temp: 98.5 F (36.9 C)  TempSrc: Oral  Resp: 16  Height:  (1.753 m)  Weight: 200 lb (90.719 kg)  SpO2: 91%    General appearance : A&OX3. NAD. Non-toxic-appearing HEENT: Atraumatic and Normocephalic.  PERRLA. EOM intact.  TM clear B. Mouth-MMM, post pharynx WNL w/o erythema, No PND. Neck: supple, no JVD. No cervical lymphadenopathy. No thyromegaly Chest/Lungs:  Breathing-non-labored, Good air entry bilaterally, breath sounds normal without rales, rhonchi, or wheezing  CVS: S1 S2 regular, no murmurs, gallops, rubs  Abdomen: Bowel sounds present, Non tender and not distended with no gaurding, rigidity or rebound. No fluid wave/ascites Extremities: Bilateral Lower Ext shows 1-2+ edema, both legs are warm to touch with = pulse throughout Neurology:  CN II-XII grossly intact, Non focal.   Psych:  TP linear. J/I WNL. Normal speech. Appropriate eye contact and affect.  Skin:  No Rash  Data Review Lab Results  Component Value Date   HGBA1C 5.3 09/24/2015   HGBA1C 5.3 08/08/2015   HGBA1C 5.3 06/29/2011     Assessment & Plan   1. Dysuria/penile discharge ?recent +STD with possible incomplete treatment/re-infection - POCT urinalysis dipstick - GC/chlamydia probe amp, urine - Urine cytology ancillary only  2. Chronic systolic heart failure (HCC) - furosemide (LASIX) 20 MG tablet; Take 1 tablet (20 mg total) by mouth daily. Until you see your cardiologist on July 11  Dispense: 30 tablet; Refill: 0 Appt with Cardiologist 06/27/2016  3. STD (sexually transmitted disease) Cover for chlamydia with  azithromycin. Safe sex practices discussed.  Will treat GC/trich pending labs.   Patient have been counseled extensively about nutrition and exercise  Return in about 4 weeks (around 07/13/2016) for with dr Venetia Night.  The patient was given clear instructions to go to ER or return to medical center if symptoms don't improve, worsen or new problems develop. The patient verbalized understanding. The patient was told to call to get lab results if they haven't heard anything in the next week.     Georgian Co, PA-C [facility] [place], [place] [phone]   06/15/2016, 4:32 PM

## 2016-06-16 LAB — URINE CYTOLOGY ANCILLARY ONLY: Trichomonas: NEGATIVE

## 2016-06-16 LAB — GC/CHLAMYDIA PROBE AMP
CT PROBE, AMP APTIMA: NOT DETECTED
GC PROBE AMP APTIMA: NOT DETECTED

## 2016-06-22 DIAGNOSIS — R45851 Suicidal ideations: Secondary | ICD-10-CM | POA: Diagnosis not present

## 2016-06-22 DIAGNOSIS — F489 Nonpsychotic mental disorder, unspecified: Secondary | ICD-10-CM | POA: Insufficient documentation

## 2016-06-22 DIAGNOSIS — F333 Major depressive disorder, recurrent, severe with psychotic symptoms: Secondary | ICD-10-CM | POA: Diagnosis not present

## 2016-06-27 ENCOUNTER — Inpatient Hospital Stay (HOSPITAL_COMMUNITY): Admission: RE | Admit: 2016-06-27 | Payer: Self-pay | Source: Ambulatory Visit

## 2016-06-29 ENCOUNTER — Ambulatory Visit: Payer: Self-pay | Admitting: Internal Medicine

## 2016-06-29 DIAGNOSIS — R45851 Suicidal ideations: Secondary | ICD-10-CM | POA: Diagnosis not present

## 2016-07-01 DIAGNOSIS — F19929 Other psychoactive substance use, unspecified with intoxication, unspecified: Secondary | ICD-10-CM | POA: Insufficient documentation

## 2016-07-01 DIAGNOSIS — F19951 Other psychoactive substance use, unspecified with psychoactive substance-induced psychotic disorder with hallucinations: Secondary | ICD-10-CM | POA: Insufficient documentation

## 2016-07-03 DIAGNOSIS — R45851 Suicidal ideations: Secondary | ICD-10-CM | POA: Diagnosis not present

## 2016-07-17 DIAGNOSIS — F419 Anxiety disorder, unspecified: Secondary | ICD-10-CM | POA: Diagnosis not present

## 2016-07-17 DIAGNOSIS — I420 Dilated cardiomyopathy: Secondary | ICD-10-CM | POA: Diagnosis not present

## 2016-07-17 DIAGNOSIS — B20 Human immunodeficiency virus [HIV] disease: Secondary | ICD-10-CM | POA: Diagnosis not present

## 2016-07-17 DIAGNOSIS — F339 Major depressive disorder, recurrent, unspecified: Secondary | ICD-10-CM | POA: Diagnosis not present

## 2016-07-17 DIAGNOSIS — B009 Herpesviral infection, unspecified: Secondary | ICD-10-CM | POA: Insufficient documentation

## 2016-07-17 DIAGNOSIS — Z79899 Other long term (current) drug therapy: Secondary | ICD-10-CM | POA: Diagnosis not present

## 2016-07-17 DIAGNOSIS — F5101 Primary insomnia: Secondary | ICD-10-CM | POA: Diagnosis not present

## 2016-07-18 ENCOUNTER — Ambulatory Visit: Payer: Self-pay | Admitting: Internal Medicine

## 2016-07-19 ENCOUNTER — Telehealth: Payer: Self-pay

## 2016-07-19 NOTE — Telephone Encounter (Signed)
Cll pt - x 10 rings the disconnected. Will mail lab results.

## 2016-07-19 NOTE — Telephone Encounter (Signed)
-----   Message from Anders Simmonds, [place] sent at 06/16/2016 12:10 PM EDT ----- Your tests came back negative for Chlamydia, gonorrhea, and trichomonas.  Return to the clinic if you continue to have any symptoms.  No further antibiotics are required at this time.   Thanks,  Georgian Co, PA-C

## 2016-08-22 ENCOUNTER — Ambulatory Visit: Payer: Self-pay | Admitting: Internal Medicine

## 2016-09-12 ENCOUNTER — Telehealth (HOSPITAL_COMMUNITY): Payer: Self-pay | Admitting: *Deleted

## 2016-09-12 DIAGNOSIS — I5022 Chronic systolic (congestive) heart failure: Secondary | ICD-10-CM

## 2016-09-12 MED ORDER — DIGOXIN 125 MCG PO TABS: 0.1250 mg | ORAL_TABLET | Freq: Every day | ORAL | 0 refills | Status: DC

## 2016-09-12 MED ORDER — SPIRONOLACTONE 25 MG PO TABS: 12.5000 mg | ORAL_TABLET | Freq: Every day | ORAL | 0 refills | Status: DC

## 2016-09-12 MED ORDER — FUROSEMIDE 40 MG PO TABS: 40.0000 mg | ORAL_TABLET | Freq: Every day | ORAL | 0 refills | Status: DC

## 2016-09-12 MED ORDER — METOPROLOL SUCCINATE ER 25 MG PO TB24: 25.0000 mg | ORAL_TABLET | Freq: Every day | ORAL | 0 refills | Status: DC

## 2016-09-12 NOTE — Telephone Encounter (Signed)
Pt left a VM on triage line stating his heart is skipping beats and he needs an appt.  Called pt to discuss, he states he feels like his heart is skipping beats and he feels real weak.  He states this all started around the 15th of the month when he ran out of his medications.  He states he is out of refills so he can not get them filled.  He states he is now living in [facility] and was seen at [facility] a few times, that is why he hasn't been here in a while but they will not refill his meds and he says he is not going back there.  appt has been sch for Tue 10/3, advised pt I will send in refills for 1 month supply and he must keep appt.  Upon review of meds heart meds are all the same except Lasix was increased to 40 mg daily per [facility].  Appt resch to [facility] 10/2 at 3:20 with Dr Gala Romney, pt is agreeable and states he will be here.

## 2016-09-18 ENCOUNTER — Encounter (HOSPITAL_COMMUNITY): Payer: Self-pay | Admitting: Internal Medicine

## 2016-09-19 ENCOUNTER — Encounter (HOSPITAL_COMMUNITY): Payer: Self-pay

## 2016-09-22 ENCOUNTER — Ambulatory Visit: Payer: Self-pay

## 2016-09-25 ENCOUNTER — Ambulatory Visit (HOSPITAL_COMMUNITY)
Admission: RE | Admit: 2016-09-25 | Discharge: 2016-09-25 | Disposition: A | Discharge status: Home / Self Care | Payer: Medicare Other | Source: Ambulatory Visit | Attending: Internal Medicine | Admitting: Internal Medicine

## 2016-09-25 VITALS — BP 111/79 | HR 105 | Resp 20 | Wt 195.8 lb

## 2016-09-25 DIAGNOSIS — F319 Bipolar disorder, unspecified: Secondary | ICD-10-CM | POA: Insufficient documentation

## 2016-09-25 DIAGNOSIS — F4310 Post-traumatic stress disorder, unspecified: Secondary | ICD-10-CM | POA: Diagnosis not present

## 2016-09-25 DIAGNOSIS — F1410 Cocaine abuse, uncomplicated: Secondary | ICD-10-CM | POA: Diagnosis not present

## 2016-09-25 DIAGNOSIS — I429 Cardiomyopathy, unspecified: Secondary | ICD-10-CM | POA: Insufficient documentation

## 2016-09-25 DIAGNOSIS — F1490 Cocaine use, unspecified, uncomplicated: Secondary | ICD-10-CM | POA: Diagnosis not present

## 2016-09-25 DIAGNOSIS — F17210 Nicotine dependence, cigarettes, uncomplicated: Secondary | ICD-10-CM | POA: Diagnosis not present

## 2016-09-25 DIAGNOSIS — I110 Hypertensive heart disease with heart failure: Secondary | ICD-10-CM | POA: Insufficient documentation

## 2016-09-25 DIAGNOSIS — J449 Chronic obstructive pulmonary disease, unspecified: Secondary | ICD-10-CM | POA: Diagnosis not present

## 2016-09-25 DIAGNOSIS — E785 Hyperlipidemia, unspecified: Secondary | ICD-10-CM | POA: Diagnosis not present

## 2016-09-25 DIAGNOSIS — Z21 Asymptomatic human immunodeficiency virus [HIV] infection status: Secondary | ICD-10-CM | POA: Diagnosis not present

## 2016-09-25 DIAGNOSIS — Z7982 Long term (current) use of aspirin: Secondary | ICD-10-CM | POA: Diagnosis not present

## 2016-09-25 DIAGNOSIS — Z79899 Other long term (current) drug therapy: Secondary | ICD-10-CM | POA: Insufficient documentation

## 2016-09-25 DIAGNOSIS — I5022 Chronic systolic (congestive) heart failure: Secondary | ICD-10-CM

## 2016-09-25 DIAGNOSIS — F17200 Nicotine dependence, unspecified, uncomplicated: Secondary | ICD-10-CM | POA: Diagnosis not present

## 2016-09-25 LAB — CBC
HCT: 52.1 % — ABNORMAL HIGH (ref 39.0–52.0)
Hemoglobin: 19 g/dL — ABNORMAL HIGH (ref 13.0–17.0)
MCH: 34 pg (ref 26.0–34.0)
MCHC: 36.5 g/dL — AB (ref 30.0–36.0)
MCV: 93.2 fL (ref 78.0–100.0)
PLATELETS: 171 10*3/uL (ref 150–400)
RBC: 5.59 MIL/uL (ref 4.22–5.81)
RDW: 13.9 % (ref 11.5–15.5)
WBC: 3.3 10*3/uL — ABNORMAL LOW (ref 4.0–10.5)

## 2016-09-25 LAB — BASIC METABOLIC PANEL
ANION GAP: 12 (ref 5–15)
BUN: 21 mg/dL — AB (ref 6–20)
CALCIUM: 9 mg/dL (ref 8.9–10.3)
CO2: 19 mmol/L — ABNORMAL LOW (ref 22–32)
Chloride: 109 mmol/L (ref 101–111)
Creatinine, Ser: 1.41 mg/dL — ABNORMAL HIGH (ref 0.61–1.24)
GFR calc Af Amer: >60 mL/min (ref >60)
GFR, EST NON AFRICAN AMERICAN: 55 mL/min — AB (ref >60)
GLUCOSE: 93 mg/dL (ref 65–99)
POTASSIUM: 3.5 mmol/L (ref 3.5–5.1)
SODIUM: 140 mmol/L (ref 135–145)

## 2016-09-25 LAB — DIGOXIN LEVEL: DIGOXIN LVL: 0.3 ng/mL — AB (ref 0.8–2.0)

## 2016-09-25 LAB — TSH: TSH: 0.63 u[IU]/mL (ref 0.350–4.500)

## 2016-09-25 LAB — BRAIN NATRIURETIC PEPTIDE: B NATRIURETIC PEPTIDE 5: 61.7 pg/mL (ref 0.0–100.0)

## 2016-09-25 MED ORDER — DIGOXIN 125 MCG PO TABS: 0.1250 mg | ORAL_TABLET | Freq: Every day | ORAL | 1 refills | Status: DC

## 2016-09-25 MED ORDER — SPIRONOLACTONE 25 MG PO TABS: 12.5000 mg | ORAL_TABLET | Freq: Every day | ORAL | 1 refills | Status: DC

## 2016-09-25 MED ORDER — FUROSEMIDE 40 MG PO TABS: 40.0000 mg | ORAL_TABLET | Freq: Every day | ORAL | 1 refills | Status: DC

## 2016-09-25 MED ORDER — METOPROLOL SUCCINATE ER 25 MG PO TB24: 25.0000 mg | ORAL_TABLET | Freq: Every day | ORAL | 1 refills | Status: DC

## 2016-09-25 MED ORDER — LOSARTAN POTASSIUM 25 MG PO TABS: 25.0000 mg | ORAL_TABLET | Freq: Every day | ORAL | 3 refills | Status: DC

## 2016-09-25 NOTE — Progress Notes (Signed)
Patient ID: Ryan BabinskiBarry Whetstone, male   DOB: 30-Sep-1961, 55 y.o.   MRN: [id number] ID: Dr Daiva [place]ves[place] Dam PCP: Dr Roselle LocusFunches  Ryan Long is a 55 y.o. male with a history of HIV, hypertension, major depressive disorder, chronic systolic heart failure (nonischemic cardiomyopathy), and polysubstance abuse.  Coronary CT angiogram in 3/12 demonstrated no CAD.  Cardiolite in 2015 showed no ischemia or infarction.  NICM was not felt to be related to HIV given his good CD4 counts.    He returns for f/u. We haven't seen him for over a year. [place] out there but didn't like it so decided to come back.   At last visit here lisinopril and lasix stopped due to low BP. Lasix has been restarted but not lisinopril   Reports feeling weak and tired. No energy. Denies dyspnea, orthopnea or PND. Continues to crack. Also continues to smoke 3/4 ppd.  Poor appetite. Feels depressed. Weight stable 195-200. No dizziness. Able to go to store and do ADLs.   ICD interrogated personally in clinic. No VT/AF. Impedance ok   ECHO 06/2013 EF 35-40% ECHO 9/16 with EF 30-35%, diffuse hypokinesis, grade I diastolic dysfunction,   Past Medical History:  Diagnosis Date  . Active smoker   . Alcohol abuse   . Anxiety   . AR (allergic rhinitis)   . Bipolar 1 disorder (HCC)   . CHF (congestive heart failure) (HCC)   . Chronic systolic heart failure (HCC)   . COPD (chronic obstructive pulmonary disease) (HCC)   . Crack cocaine use   . Depression   . Genital herpes   . HIV (human immunodeficiency virus infection) (HCC)   . HLD (hyperlipidemia)   . Hypertension   . NICM (nonischemic cardiomyopathy) (HCC)    Echocardiogram 06/28/11: EF 30-35%, mild MR, mild LAE;  [facility]  . NSVT (nonsustained ventricular tachycardia) (HCC)   . PTSD (post-traumatic stress disorder)     Current Outpatient Prescriptions  Medication Sig Dispense  Refill  . albuterol (PROVENTIL HFA;VENTOLIN HFA) 108 (90 BASE) MCG/ACT inhaler Inhale 2 puffs into the lungs every 6 (six) hours as needed for wheezing or shortness of breath. 1 Inhaler 0  . aspirin 81 MG chewable tablet Chew 1 tablet (81 mg total) by mouth every morning. For heart health    . digoxin (LANOXIN) 0.125 MG tablet Take 1 tablet (0.125 mg total) by mouth daily. For control of heart arrythmia. 30 tablet 0  . dolutegravir (TIVICAY) 50 MG tablet Take 1 tablet (50 mg total) by mouth daily. 30 tablet 11  . emtricitabine-tenofovir AF (DESCOVY) 200-25 MG tablet Take 1 tablet by mouth daily. 30 tablet 11  . furosemide (LASIX) 40 MG tablet Take 1 tablet (40 mg total) by mouth daily. 30 tablet 0  . hydrOXYzine (ATARAX/VISTARIL) 25 MG tablet 1 to 2 tabs at night for sleep 60 tablet 3  . ibuprofen (ADVIL,MOTRIN) 800 MG tablet Take 1 tablet (800 mg total) by mouth 3 (three) times daily. 21 tablet 0  . metoprolol succinate (TOPROL-XL) 25 MG 24 hr tablet Take 1 tablet (25 mg total) by mouth at bedtime. For high blood pressure 30 tablet 0  . Multiple Vitamins-Minerals (MULTIVITAMIN WITH MINERALS) tablet Take 1 tablet by mouth daily.    Marland Kitchen. spironolactone (ALDACTONE) 25 MG tablet Take 0.5 tablets (12.5 mg total) by mouth daily. For heart condition 15 tablet 0  . valACYclovir (VALTREX)  500 MG tablet Take 1 tablet (500 mg total) by mouth daily. For Herpes. 30 tablet 11   No current facility-administered medications for this encounter.     Allergies: No Known Allergies  ROS:  All systems reviewed and negative except as per HP.   Vital Signs: BP 111/79 (BP Location: Left Arm, Patient Position: Sitting, Cuff Size: Normal)   Pulse (!) 105 Comment: regular  Resp 20   Wt 195 lb 12 oz (88.8 kg)   SpO2 99%   BMI 28.91 kg/m   Vitals:   09/25/16 1003  BP: 111/79  BP Location: Left Arm  Patient Position: Sitting  Cuff Size: Normal  Pulse: (!) 105  Resp: 20  SpO2: 99%  Weight: 195 lb 12 oz (88.8  kg)     PHYSICAL EXAM: Well nourished, well developed, in no acute distres. Ambulated in the clinic without difficulty.  HEENT: normal  Neck: Minimally elevated JVP flat  Cardiac:  Tachy regular. No murmur.  Lungs:  clear to auscultation bilaterally, no rhonchi or rales, faint expiratory wheezing noted  Abd: soft, nontender, no hepatomegaly  Ext: no edema  Skin: warm and dry  Neuro:  CNs 2-12 intact, no focal abnormalities noted Psych: Normal affect  ECG: sinu  tach 106 with repol abnormalities  ASSESSMENT AND PLAN:   1. Chronic Systolic Heart Failure: Nonischemic cardiomyopathy.  Echo (9/16) with EF 30-35%, diffuse hypokinesis.  --NYHA class II-III - Volume status ok on exam and by ICD interrogation. BP has been low previously but now OK.  - Continue lasix, Toprol, spiro, digoxin, will need to check level. - Start losartan 25 daily  - BMET/BNP today.   - Would repeat echo in 3-4 months with medical treatment.  If EF remains low, will need ICD if drug free.  2. HIV: Followed by Dr Daiva Eves  3. Currrent Smoker: 3/4 PPD, I strongly encouraged him to quit.  4. Depression and crack cocaine use: Ongoing. Long discussion about possibility of checking in to [facility] for eval.   Total time spent 45 minutes. Over half that time spent discussing above.   Arvilla Meres MD 09/25/2016

## 2016-09-25 NOTE — Patient Instructions (Signed)
START Losartan 25 mg tablet once daily.  Routine lab work today. Will notify you of abnormal results, otherwise no news is good news!  Follow up 4 weeks with echocardiogram.  Do the following things EVERYDAY: 1) Weigh yourself in the morning before breakfast. Write it down and keep it in a log. 2) Take your medicines as prescribed 3) Eat low salt foods-Limit salt (sodium) to 2000 mg per day.  4) Stay as active as you can everyday 5) Limit all fluids for the day to less than 2 liters

## 2016-09-27 ENCOUNTER — Other Ambulatory Visit: Payer: Self-pay

## 2016-10-04 ENCOUNTER — Ambulatory Visit: Payer: Self-pay | Admitting: Family Medicine

## 2016-10-04 ENCOUNTER — Other Ambulatory Visit: Payer: Self-pay

## 2016-10-11 ENCOUNTER — Ambulatory Visit: Payer: Self-pay | Admitting: Infectious Disease

## 2016-10-18 ENCOUNTER — Ambulatory Visit: Payer: Self-pay | Admitting: Family Medicine

## 2016-10-23 ENCOUNTER — Ambulatory Visit (HOSPITAL_COMMUNITY): Admission: RE | Admit: 2016-10-23 | Payer: Medicaid Other | Source: Ambulatory Visit

## 2016-10-23 ENCOUNTER — Inpatient Hospital Stay (HOSPITAL_COMMUNITY): Admission: RE | Admit: 2016-10-23 | Payer: Self-pay | Source: Ambulatory Visit | Admitting: Internal Medicine

## 2016-10-27 DIAGNOSIS — R300 Dysuria: Secondary | ICD-10-CM | POA: Diagnosis not present

## 2016-10-27 DIAGNOSIS — J449 Chronic obstructive pulmonary disease, unspecified: Secondary | ICD-10-CM | POA: Diagnosis not present

## 2016-10-27 DIAGNOSIS — R3989 Other symptoms and signs involving the genitourinary system: Secondary | ICD-10-CM | POA: Diagnosis not present

## 2016-10-27 DIAGNOSIS — F17210 Nicotine dependence, cigarettes, uncomplicated: Secondary | ICD-10-CM | POA: Diagnosis not present

## 2016-10-27 DIAGNOSIS — F319 Bipolar disorder, unspecified: Secondary | ICD-10-CM | POA: Diagnosis not present

## 2016-10-27 DIAGNOSIS — Z8249 Family history of ischemic heart disease and other diseases of the circulatory system: Secondary | ICD-10-CM | POA: Diagnosis not present

## 2016-10-27 DIAGNOSIS — Z7982 Long term (current) use of aspirin: Secondary | ICD-10-CM | POA: Diagnosis not present

## 2016-10-27 DIAGNOSIS — G8929 Other chronic pain: Secondary | ICD-10-CM | POA: Diagnosis not present

## 2016-10-27 DIAGNOSIS — I509 Heart failure, unspecified: Secondary | ICD-10-CM | POA: Diagnosis not present

## 2016-10-27 DIAGNOSIS — E079 Disorder of thyroid, unspecified: Secondary | ICD-10-CM | POA: Diagnosis not present

## 2016-10-27 DIAGNOSIS — Z21 Asymptomatic human immunodeficiency virus [HIV] infection status: Secondary | ICD-10-CM | POA: Diagnosis not present

## 2016-10-27 DIAGNOSIS — G629 Polyneuropathy, unspecified: Secondary | ICD-10-CM | POA: Diagnosis not present

## 2016-10-27 DIAGNOSIS — I10 Essential (primary) hypertension: Secondary | ICD-10-CM | POA: Diagnosis not present

## 2016-10-27 DIAGNOSIS — J45909 Unspecified asthma, uncomplicated: Secondary | ICD-10-CM | POA: Diagnosis not present

## 2016-10-27 DIAGNOSIS — R350 Frequency of micturition: Secondary | ICD-10-CM | POA: Diagnosis not present

## 2016-10-27 DIAGNOSIS — R369 Urethral discharge, unspecified: Secondary | ICD-10-CM | POA: Diagnosis not present

## 2016-10-31 ENCOUNTER — Inpatient Hospital Stay (HOSPITAL_COMMUNITY): Admission: RE | Admit: 2016-10-31 | Payer: Self-pay | Source: Ambulatory Visit

## 2016-11-01 DIAGNOSIS — I429 Cardiomyopathy, unspecified: Secondary | ICD-10-CM | POA: Diagnosis not present

## 2016-11-01 DIAGNOSIS — J449 Chronic obstructive pulmonary disease, unspecified: Secondary | ICD-10-CM | POA: Diagnosis not present

## 2016-11-01 DIAGNOSIS — I509 Heart failure, unspecified: Secondary | ICD-10-CM | POA: Diagnosis not present

## 2016-11-01 DIAGNOSIS — E785 Hyperlipidemia, unspecified: Secondary | ICD-10-CM | POA: Diagnosis not present

## 2016-11-01 DIAGNOSIS — I10 Essential (primary) hypertension: Secondary | ICD-10-CM | POA: Diagnosis not present

## 2016-11-07 ENCOUNTER — Other Ambulatory Visit: Payer: Self-pay

## 2016-11-08 ENCOUNTER — Ambulatory Visit: Payer: Self-pay | Admitting: Family Medicine

## 2016-11-13 ENCOUNTER — Encounter (HOSPITAL_COMMUNITY): Payer: Self-pay | Admitting: *Deleted

## 2016-11-13 ENCOUNTER — Emergency Department (HOSPITAL_COMMUNITY)
Admission: EM | Admit: 2016-11-13 | Discharge: 2016-11-13 | Disposition: A | Discharge status: Home / Self Care | Payer: Medicare Other | Attending: Emergency Medicine | Admitting: Emergency Medicine

## 2016-11-13 DIAGNOSIS — Z7982 Long term (current) use of aspirin: Secondary | ICD-10-CM | POA: Insufficient documentation

## 2016-11-13 DIAGNOSIS — R300 Dysuria: Secondary | ICD-10-CM | POA: Diagnosis not present

## 2016-11-13 DIAGNOSIS — Z202 Contact with and (suspected) exposure to infections with a predominantly sexual mode of transmission: Secondary | ICD-10-CM

## 2016-11-13 DIAGNOSIS — F17210 Nicotine dependence, cigarettes, uncomplicated: Secondary | ICD-10-CM | POA: Diagnosis not present

## 2016-11-13 DIAGNOSIS — I5022 Chronic systolic (congestive) heart failure: Secondary | ICD-10-CM | POA: Diagnosis not present

## 2016-11-13 DIAGNOSIS — I110 Hypertensive heart disease with heart failure: Secondary | ICD-10-CM | POA: Diagnosis not present

## 2016-11-13 DIAGNOSIS — Z79899 Other long term (current) drug therapy: Secondary | ICD-10-CM | POA: Insufficient documentation

## 2016-11-13 DIAGNOSIS — J449 Chronic obstructive pulmonary disease, unspecified: Secondary | ICD-10-CM | POA: Insufficient documentation

## 2016-11-13 LAB — URINALYSIS, ROUTINE W REFLEX MICROSCOPIC
Bilirubin Urine: NEGATIVE
GLUCOSE, UA: NEGATIVE mg/dL
HGB URINE DIPSTICK: NEGATIVE
Ketones, ur: NEGATIVE mg/dL
Leukocytes, UA: NEGATIVE
Nitrite: NEGATIVE
PROTEIN: NEGATIVE mg/dL
Specific Gravity, Urine: 1.013 (ref 1.005–1.030)
pH: 5.5 (ref 5.0–8.0)

## 2016-11-13 MED ORDER — AZITHROMYCIN 1 G PO PACK
1.0000 g | PACK | Freq: Once | ORAL | Status: AC
  Administered 2016-11-13: 1 g via ORAL
  Filled 2016-11-13: qty 1

## 2016-11-13 MED ORDER — CEFTRIAXONE SODIUM 250 MG IJ SOLR
250.0000 mg | Freq: Once | INTRAMUSCULAR | Status: AC
Start: 2016-11-13 — End: 2016-11-13
  Administered 2016-11-13: 250 mg via INTRAMUSCULAR
  Filled 2016-11-13: qty 250

## 2016-11-13 NOTE — ED Notes (Signed)
No questions upon discharge 

## 2016-11-13 NOTE — ED Notes (Signed)
PT reports he had unprotected sex about a week ago and noticed watery discharge and burning starting Saturday. He states she has a bump on her lip. She told him he needed to get treated for an STD. He does not plan on having intercourse with her again.

## 2016-11-13 NOTE — ED Notes (Signed)
Got patient ready for discharge 

## 2016-11-13 NOTE — ED Provider Notes (Signed)
[facility] DEPT Provider Note   CSN: [id number] Arrival date & time: 11/13/16  4098     History   Chief Complaint Chief Complaint  Patient presents with  . Dysuria    HPI Ryan Long is a 55 y.o. male.   Dysuria   This is a new problem. The current episode started more than 2 days ago. The problem occurs intermittently. The problem has not changed since onset.The quality of the pain is described as burning. The pain is at a severity of 5/10. The pain is mild. There has been no fever. He is sexually active. There is no history of pyelonephritis. Pertinent negatives include no chills, no vomiting and no urgency. He has tried nothing for the symptoms.    Past Medical History:  Diagnosis Date  . Active smoker   . Alcohol abuse   . Anxiety   . AR (allergic rhinitis)   . Bipolar 1 disorder (HCC)   . CHF (congestive heart failure) (HCC)   . Chronic systolic heart failure (HCC)   . COPD (chronic obstructive pulmonary disease) (HCC)   . Crack cocaine use   . Depression   . Genital herpes   . HIV (human immunodeficiency virus infection) (HCC)   . HLD (hyperlipidemia)   . Hypertension   . NICM (nonischemic cardiomyopathy) (HCC)    Echocardiogram 06/28/11: EF 30-35%, mild MR, mild LAE;  No CAD by coronary CT angiogram 3/12 at [facility]  . NSVT (nonsustained ventricular tachycardia) (HCC)   . PTSD (post-traumatic stress disorder)     Patient Active Problem List   Diagnosis Date Noted  . Screening examination for venereal disease 04/18/2016  . Encounter for long-term (current) use of medications 04/18/2016  . Major depressive disorder, recurrent (HCC) 10/08/2015  . COPD (chronic obstructive pulmonary disease) (HCC) 10/04/2015  . Abnormal thyroid stimulating hormone (TSH) level 10/04/2015  . Severe episode of recurrent major depressive disorder, without psychotic features (HCC)   . MDD (major depressive disorder), recurrent episode, severe (HCC) 09/23/2015   . HTN (hypertension) 09/10/2015  . MDD (major depressive disorder), recurrent, severe, with psychosis (HCC) 08/07/2015  . PTSD (post-traumatic stress disorder) 08/07/2015  . Cocaine use disorder, severe, dependence (HCC) 08/07/2015  . Alcohol use disorder, severe, dependence (HCC) 08/07/2015  . Tobacco use disorder 08/07/2015  . Suicidal ideation   . Polysubstance (excluding opioids) dependence with physiological dependence (HCC) 12/30/2013  . Substance induced mood disorder (HCC) 11/22/2013  . Cocaine abuse, episodic use 03/20/2012    Class: Acute  . Alcohol dependence (HCC) 03/20/2012    Class: Acute  . Chronic systolic heart failure (HCC)   . Cardiomyopathy, nonischemic (HCC) 08/24/2011  . Human immunodeficiency virus (HIV) disease (HCC) 02/01/2009  . Recurrent HSV (herpes simplex virus) 02/01/2009  . HYPERLIPIDEMIA 02/01/2009  . ALLERGIC RHINITIS 02/01/2009    Past Surgical History:  Procedure Laterality Date  . NO PAST SURGERIES         Home Medications    Prior to Admission medications   Medication Sig Start Date End Date Taking? Authorizing Provider  albuterol (PROVENTIL HFA;VENTOLIN HFA) 108 (90 BASE) MCG/ACT inhaler Inhale 2 puffs into the lungs every 6 (six) hours as needed for wheezing or shortness of breath. 11/09/15  Yes Jaclyn Shaggy, MD  aspirin 81 MG chewable tablet Chew 1 tablet (81 mg total) by mouth every morning. For heart health 09/28/15  Yes Adonis Brook, NP  digoxin (LANOXIN) 0.125 MG tablet Take 1 tablet (0.125 mg total) by mouth daily. For control  of heart arrythmia. 09/25/16  Yes Bevelyn Bucklesaniel R Bensimhon, MD  dolutegravir (TIVICAY) 50 MG tablet Take 1 tablet (50 mg total) by mouth daily. 04/18/16  Yes Gardiner Barefootobert W Comer, MD  emtricitabine-tenofovir AF (DESCOVY) 200-25 MG tablet Take 1 tablet by mouth daily. 04/18/16  Yes Gardiner Barefootobert W Comer, MD  furosemide (LASIX) 40 MG tablet Take 1 tablet (40 mg total) by mouth daily. 09/25/16  Yes Dolores Pattyaniel R Bensimhon, MD  hydrOXYzine  (ATARAX/VISTARIL) 25 MG tablet 1 to 2 tabs at night for sleep Patient taking differently: Take 25-50 mg by mouth at bedtime.  06/15/16  Yes Marzella SchleinAngela M McClung, PA-C  losartan (COZAAR) 25 MG tablet Take 1 tablet (25 mg total) by mouth daily. 09/25/16 12/24/16 Yes Bevelyn Bucklesaniel R Bensimhon, MD  metoprolol succinate (TOPROL-XL) 25 MG 24 hr tablet Take 1 tablet (25 mg total) by mouth at bedtime. For high blood pressure Patient taking differently: Take 50 mg by mouth at bedtime. For high blood pressure 09/25/16  Yes Dolores Pattyaniel R Bensimhon, MD  Multiple Vitamins-Minerals (MULTIVITAMIN WITH MINERALS) tablet Take 1 tablet by mouth daily.   Yes Historical Provider, MD  spironolactone (ALDACTONE) 25 MG tablet Take 0.5 tablets (12.5 mg total) by mouth daily. For heart condition Patient taking differently: Take 25 mg by mouth daily. For heart condition 09/25/16  Yes Dolores Pattyaniel R Bensimhon, MD  valACYclovir (VALTREX) 500 MG tablet Take 1 tablet (500 mg total) by mouth daily. For Herpes. 04/18/16  Yes Gardiner Barefootobert W Comer, MD    Family History Family History  Problem Relation Age of Onset  . Hypertension Father   . CAD Father     Social History Social History  Substance Use Topics  . Smoking status: Current Every Day Smoker    Packs/day: 0.50    Years: 30.00    Types: Cigarettes  . Smokeless tobacco: Never Used     Comment: 15 cigarettes daily  . Alcohol use 3.6 oz/week    6 Cans of beer per week     Comment: reports drinks 6+ beers 3 times a month     Allergies   Patient has no known allergies.   Review of Systems Review of Systems  Constitutional: Negative for chills.  Gastrointestinal: Negative for vomiting.  Genitourinary: Positive for dysuria and genital sores. Negative for discharge, penile pain, penile swelling, scrotal swelling, testicular pain and urgency.  All other systems reviewed and are negative.    Physical Exam Updated Vital Signs BP 95/81 (BP Location: Left Arm)   Pulse 94   Temp 97.9 F (36.6  C) (Oral)   Resp 19   Ht 5\' 9"  (1.753 m)   Wt 200 lb (90.7 kg)   SpO2 95%   BMI 29.53 kg/m   Physical Exam  Constitutional: He appears well-developed and well-nourished.  HENT:  Head: Normocephalic and atraumatic.  Eyes: Conjunctivae are normal.  Neck: Neck supple.  Cardiovascular: Normal rate and regular rhythm.   No murmur heard. Pulmonary/Chest: Effort normal and breath sounds normal. No respiratory distress.  Abdominal: Soft. There is no tenderness.  Genitourinary: Right testis shows no mass, no swelling and no tenderness. Right testis is descended. Left testis shows no mass, no swelling and no tenderness. Left testis is descended.  Musculoskeletal: He exhibits no edema.  Neurological: He is alert.  Skin: Skin is warm and dry.  Psychiatric: He has a normal mood and affect.  Nursing note and vitals reviewed.    ED Treatments / Results  Labs (all labs ordered are listed, but only  abnormal results are displayed) Labs Reviewed  URINALYSIS, ROUTINE W REFLEX MICROSCOPIC (NOT AT [facility])  RPR  GC/CHLAMYDIA PROBE AMP ([place]) NOT AT [facility]    EKG  EKG Interpretation None       Radiology No results found.  Procedures Procedures (including critical care time)  Medications Ordered in ED Medications  cefTRIAXone (ROCEPHIN) injection 250 mg (not administered)  azithromycin (ZITHROMAX) powder 1 g (not administered)     Initial Impression / Assessment and Plan / ED Course  I have reviewed the triage vital signs and the nursing notes.  Pertinent labs & imaging results that were available during my care of the patient were reviewed by me and considered in my medical decision making (see chart for details).  Clinical Course     STD exposure with dysuria. Also has a 'bump' on scrotum that appears to be an ingrown hair.  Counseled him on unprotected intercourse (even with other HIV + individuals) and he will abstain.  Rocephin/azithro given. rpr checked. Plan for dc  with ID follow up.   Final Clinical Impressions(s) / ED Diagnoses   Final diagnoses:  STD exposure    New Prescriptions Current Discharge Medication List       Marily Memos, MD 11/13/16 1012

## 2016-11-13 NOTE — ED Triage Notes (Signed)
Pt states he had sex with a woman last week and yesterday she told him she had an std. Now he has pain with urination and L inguinal pain.

## 2016-11-14 LAB — RPR: RPR Ser Ql: NONREACTIVE

## 2016-11-15 LAB — GC/CHLAMYDIA PROBE AMP (~~LOC~~) NOT AT ARMC
CHLAMYDIA, DNA PROBE: NEGATIVE
Neisseria Gonorrhea: NEGATIVE

## 2016-11-17 ENCOUNTER — Ambulatory Visit: Payer: Self-pay

## 2016-11-17 DIAGNOSIS — Z113 Encounter for screening for infections with a predominantly sexual mode of transmission: Secondary | ICD-10-CM | POA: Diagnosis not present

## 2016-11-17 DIAGNOSIS — R7309 Other abnormal glucose: Secondary | ICD-10-CM | POA: Diagnosis not present

## 2016-11-17 DIAGNOSIS — Z87898 Personal history of other specified conditions: Secondary | ICD-10-CM | POA: Diagnosis not present

## 2016-11-17 DIAGNOSIS — I10 Essential (primary) hypertension: Secondary | ICD-10-CM | POA: Diagnosis not present

## 2016-11-17 DIAGNOSIS — I420 Dilated cardiomyopathy: Secondary | ICD-10-CM | POA: Diagnosis not present

## 2016-11-17 DIAGNOSIS — E789 Disorder of lipoprotein metabolism, unspecified: Secondary | ICD-10-CM | POA: Diagnosis not present

## 2016-11-17 DIAGNOSIS — Z23 Encounter for immunization: Secondary | ICD-10-CM | POA: Diagnosis not present

## 2016-11-17 DIAGNOSIS — Z720 Tobacco use: Secondary | ICD-10-CM | POA: Diagnosis not present

## 2016-11-17 DIAGNOSIS — B20 Human immunodeficiency virus [HIV] disease: Secondary | ICD-10-CM | POA: Diagnosis not present

## 2016-11-17 DIAGNOSIS — Z0000 Encounter for general adult medical examination without abnormal findings: Secondary | ICD-10-CM | POA: Diagnosis not present

## 2016-11-20 ENCOUNTER — Other Ambulatory Visit: Payer: Self-pay

## 2016-11-21 ENCOUNTER — Ambulatory Visit: Payer: Self-pay | Admitting: Infectious Disease

## 2016-11-22 ENCOUNTER — Encounter (HOSPITAL_COMMUNITY): Payer: Self-pay

## 2016-12-12 ENCOUNTER — Encounter (HOSPITAL_COMMUNITY): Payer: Self-pay | Admitting: *Deleted

## 2017-01-09 ENCOUNTER — Ambulatory Visit: Payer: Self-pay | Admitting: Family Medicine

## 2017-01-24 ENCOUNTER — Ambulatory Visit: Payer: Self-pay | Admitting: Family Medicine

## 2017-02-08 DIAGNOSIS — Z8249 Family history of ischemic heart disease and other diseases of the circulatory system: Secondary | ICD-10-CM | POA: Diagnosis not present

## 2017-02-08 DIAGNOSIS — J449 Chronic obstructive pulmonary disease, unspecified: Secondary | ICD-10-CM | POA: Diagnosis not present

## 2017-02-08 DIAGNOSIS — J45909 Unspecified asthma, uncomplicated: Secondary | ICD-10-CM | POA: Diagnosis not present

## 2017-02-08 DIAGNOSIS — I10 Essential (primary) hypertension: Secondary | ICD-10-CM | POA: Diagnosis not present

## 2017-02-08 DIAGNOSIS — G629 Polyneuropathy, unspecified: Secondary | ICD-10-CM | POA: Diagnosis not present

## 2017-02-08 DIAGNOSIS — F17210 Nicotine dependence, cigarettes, uncomplicated: Secondary | ICD-10-CM | POA: Diagnosis not present

## 2017-02-08 DIAGNOSIS — Z79899 Other long term (current) drug therapy: Secondary | ICD-10-CM | POA: Diagnosis not present

## 2017-02-08 DIAGNOSIS — R369 Urethral discharge, unspecified: Secondary | ICD-10-CM | POA: Diagnosis not present

## 2017-02-08 DIAGNOSIS — R1900 Intra-abdominal and pelvic swelling, mass and lump, unspecified site: Secondary | ICD-10-CM | POA: Diagnosis not present

## 2017-02-08 DIAGNOSIS — R102 Pelvic and perineal pain: Secondary | ICD-10-CM | POA: Diagnosis not present

## 2017-02-08 DIAGNOSIS — R300 Dysuria: Secondary | ICD-10-CM | POA: Diagnosis not present

## 2017-02-27 ENCOUNTER — Other Ambulatory Visit: Payer: Self-pay | Admitting: Internal Medicine

## 2017-03-06 DIAGNOSIS — F329 Major depressive disorder, single episode, unspecified: Secondary | ICD-10-CM | POA: Diagnosis not present

## 2017-03-06 DIAGNOSIS — I429 Cardiomyopathy, unspecified: Secondary | ICD-10-CM | POA: Diagnosis not present

## 2017-03-06 DIAGNOSIS — B20 Human immunodeficiency virus [HIV] disease: Secondary | ICD-10-CM | POA: Diagnosis not present

## 2017-03-06 DIAGNOSIS — Z79899 Other long term (current) drug therapy: Secondary | ICD-10-CM | POA: Diagnosis not present

## 2017-03-06 DIAGNOSIS — K131 Cheek and lip biting: Secondary | ICD-10-CM | POA: Diagnosis not present

## 2017-03-06 DIAGNOSIS — J42 Unspecified chronic bronchitis: Secondary | ICD-10-CM | POA: Diagnosis not present

## 2017-03-06 DIAGNOSIS — F1911 Other psychoactive substance abuse, in remission: Secondary | ICD-10-CM | POA: Diagnosis not present

## 2017-03-07 ENCOUNTER — Telehealth: Payer: Self-pay | Admitting: Licensed Clinical Social Worker

## 2017-03-07 NOTE — Telephone Encounter (Signed)
CSW received referral to contact patient regarding no shows to HF clinic appointments. CSW left message for return call. Lasandra Beech, LCSW, CCSW-MCS [phone]

## 2017-03-08 ENCOUNTER — Encounter: Payer: Self-pay | Admitting: Physician Assistant

## 2017-03-08 ENCOUNTER — Encounter: Payer: Self-pay | Admitting: Licensed Clinical Social Worker

## 2017-03-08 ENCOUNTER — Ambulatory Visit: Payer: Medicare Other | Attending: Family Medicine | Admitting: Physician Assistant

## 2017-03-08 VITALS — BP 119/79 | HR 95 | Temp 98.1°F | Wt 198.2 lb

## 2017-03-08 DIAGNOSIS — J449 Chronic obstructive pulmonary disease, unspecified: Secondary | ICD-10-CM | POA: Diagnosis not present

## 2017-03-08 DIAGNOSIS — Z7982 Long term (current) use of aspirin: Secondary | ICD-10-CM | POA: Diagnosis not present

## 2017-03-08 DIAGNOSIS — I429 Cardiomyopathy, unspecified: Secondary | ICD-10-CM | POA: Diagnosis not present

## 2017-03-08 DIAGNOSIS — I159 Secondary hypertension, unspecified: Secondary | ICD-10-CM | POA: Insufficient documentation

## 2017-03-08 DIAGNOSIS — I110 Hypertensive heart disease with heart failure: Secondary | ICD-10-CM | POA: Insufficient documentation

## 2017-03-08 DIAGNOSIS — F319 Bipolar disorder, unspecified: Secondary | ICD-10-CM | POA: Diagnosis not present

## 2017-03-08 DIAGNOSIS — F17200 Nicotine dependence, unspecified, uncomplicated: Secondary | ICD-10-CM | POA: Insufficient documentation

## 2017-03-08 DIAGNOSIS — I5022 Chronic systolic (congestive) heart failure: Secondary | ICD-10-CM | POA: Insufficient documentation

## 2017-03-08 DIAGNOSIS — J40 Bronchitis, not specified as acute or chronic: Secondary | ICD-10-CM | POA: Diagnosis not present

## 2017-03-08 DIAGNOSIS — F4310 Post-traumatic stress disorder, unspecified: Secondary | ICD-10-CM | POA: Insufficient documentation

## 2017-03-08 DIAGNOSIS — E785 Hyperlipidemia, unspecified: Secondary | ICD-10-CM | POA: Diagnosis not present

## 2017-03-08 DIAGNOSIS — F419 Anxiety disorder, unspecified: Secondary | ICD-10-CM | POA: Diagnosis not present

## 2017-03-08 LAB — CBC WITH DIFFERENTIAL/PLATELET
BASOS ABS: 35 {cells}/uL (ref 0–200)
Basophils Relative: 1 %
EOS PCT: 1 %
Eosinophils Absolute: 35 cells/uL (ref 15–500)
HCT: 47.5 % (ref 38.5–50.0)
HEMOGLOBIN: 16.7 g/dL (ref 13.2–17.1)
LYMPHS ABS: 1680 {cells}/uL (ref 850–3900)
Lymphocytes Relative: 48 %
MCH: 34.2 pg — ABNORMAL HIGH (ref 27.0–33.0)
MCHC: 35.2 g/dL (ref 32.0–36.0)
MCV: 97.1 fL (ref 80.0–100.0)
MONO ABS: 350 {cells}/uL (ref 200–950)
MPV: 9.6 fL (ref 7.5–12.5)
Monocytes Relative: 10 %
NEUTROS PCT: 40 %
Neutro Abs: 1400 cells/uL — ABNORMAL LOW (ref 1500–7800)
Platelets: 170 10*3/uL (ref 140–400)
RBC: 4.89 MIL/uL (ref 4.20–5.80)
RDW: 14.4 % (ref 11.0–15.0)
WBC: 3.5 10*3/uL — AB (ref 3.8–10.8)

## 2017-03-08 LAB — COMPREHENSIVE METABOLIC PANEL
ALK PHOS: 44 U/L (ref 40–115)
ALT: 20 U/L (ref 9–46)
AST: 22 U/L (ref 10–35)
Albumin: 4.1 g/dL (ref 3.6–5.1)
BUN: 20 mg/dL (ref 7–25)
CALCIUM: 9 mg/dL (ref 8.6–10.3)
CHLORIDE: 106 mmol/L (ref 98–110)
CO2: 25 mmol/L (ref 20–31)
Creat: 1.11 mg/dL (ref 0.70–1.33)
GLUCOSE: 88 mg/dL (ref 65–99)
POTASSIUM: 3.9 mmol/L (ref 3.5–5.3)
Sodium: 140 mmol/L (ref 135–146)
Total Bilirubin: 0.9 mg/dL (ref 0.2–1.2)
Total Protein: 6.3 g/dL (ref 6.1–8.1)

## 2017-03-08 MED ORDER — FUROSEMIDE 40 MG PO TABS: 40.0000 mg | ORAL_TABLET | Freq: Every day | ORAL | 1 refills | Status: DC

## 2017-03-08 MED ORDER — AZITHROMYCIN 250 MG PO TABS: ORAL_TABLET | ORAL | 0 refills | Status: DC

## 2017-03-08 MED ORDER — PREDNISONE 10 MG PO TABS: ORAL_TABLET | ORAL | 0 refills | Status: DC

## 2017-03-08 MED ORDER — ALBUTEROL SULFATE HFA 108 (90 BASE) MCG/ACT IN AERS: 2.0000 | INHALATION_SPRAY | Freq: Four times a day (QID) | RESPIRATORY_TRACT | 0 refills | Status: DC | PRN

## 2017-03-08 MED ORDER — METOPROLOL SUCCINATE ER 25 MG PO TB24: 25.0000 mg | ORAL_TABLET | Freq: Every day | ORAL | 3 refills | Status: DC

## 2017-03-08 MED ORDER — SPIRONOLACTONE 25 MG PO TABS: 25.0000 mg | ORAL_TABLET | Freq: Every day | ORAL | 3 refills | Status: DC

## 2017-03-08 MED ORDER — LOSARTAN POTASSIUM 50 MG PO TABS: 50.0000 mg | ORAL_TABLET | Freq: Every day | ORAL | 3 refills | Status: DC

## 2017-03-08 MED ORDER — DIGOXIN 125 MCG PO TABS: 0.1250 mg | ORAL_TABLET | Freq: Every day | ORAL | 0 refills | Status: DC

## 2017-03-08 NOTE — Progress Notes (Signed)
Ryan Long, is a 56 y.o. male  WUJ:[id number]  NFA:[id number]  DOB - 02-21-1961  Subjective:  Chief Complaint and HPI: Ryan Long is a 56 y.o. male here today for 1 week h/o cough productive of greenish sputum anmd wheezing.  Followed by ID for HIV.  Last CD4 viral load counts were WNL.  No f/c.  He also needs RF of all his meds.      ROS:   Constitutional:  No f/c, No night sweats, No unexplained weight loss. EENT:  No vision changes, No blurry vision, No hearing changes. No mouth, throat, or ear problems.  Respiratory: + cough, + SOB and wheezing Cardiac: No CP, no palpitations GI:  No abd pain, No N/V/D. GU: No Urinary s/sx Musculoskeletal: No joint pain Neuro: No headache, no dizziness, no motor weakness.  Skin: No rash Endocrine:  No polydipsia. No polyuria.  Psych: Denies SI/HI  No problems updated.  ALLERGIES: No Known Allergies  PAST MEDICAL HISTORY: Past Medical History:  Diagnosis Date  . Active smoker   . Alcohol abuse   . Anxiety   . AR (allergic rhinitis)   . Bipolar 1 disorder (HCC)   . CHF (congestive heart failure) (HCC)   . Chronic systolic heart failure (HCC)   . COPD (chronic obstructive pulmonary disease) (HCC)   . Crack cocaine use   . Depression   . Genital herpes   . HIV (human immunodeficiency virus infection) (HCC)   . HLD (hyperlipidemia)   . Hypertension   . NICM (nonischemic cardiomyopathy) (HCC)    Echocardiogram 06/28/11: EF 30-35%, mild MR, mild LAE;  No CAD by coronary CT angiogram 3/12 at [facility]  . NSVT (nonsustained ventricular tachycardia) (HCC)   . PTSD (post-traumatic stress disorder)     MEDICATIONS AT HOME: Prior to Admission medications   Medication Sig Start Date End Date Taking? Authorizing Provider  albuterol (PROVENTIL HFA;VENTOLIN HFA) 108 (90 Base) MCG/ACT inhaler Inhale 2 puffs into the lungs every 6 (six) hours as needed for wheezing or shortness of breath. 03/08/17  Yes Anders Simmonds,  PA-C  aspirin 81 MG chewable tablet Chew 1 tablet (81 mg total) by mouth every morning. For heart health 09/28/15  Yes Adonis Brook, NP  digoxin (LANOXIN) 0.125 MG tablet Take 1 tablet (0.125 mg total) by mouth daily. For control of heart arrythmia. 03/08/17  Yes Marzella Schlein Johnhenry Tippin, PA-C  dolutegravir (TIVICAY) 50 MG tablet Take 1 tablet (50 mg total) by mouth daily. 04/18/16  Yes Gardiner Barefoot, MD  emtricitabine-tenofovir AF (DESCOVY) 200-25 MG tablet Take 1 tablet by mouth daily. 04/18/16  Yes Gardiner Barefoot, MD  furosemide (LASIX) 40 MG tablet Take 1 tablet (40 mg total) by mouth daily. 03/08/17  Yes Anders Simmonds, PA-C  hydrOXYzine (ATARAX/VISTARIL) 25 MG tablet 1 to 2 tabs at night for sleep Patient taking differently: Take 25-50 mg by mouth at bedtime.  06/15/16  Yes Marzella Schlein Zada Haser, PA-C  metoprolol succinate (TOPROL-XL) 25 MG 24 hr tablet Take 1 tablet (25 mg total) by mouth at bedtime. For high blood pressure 03/08/17  Yes Anders Simmonds, PA-C  Multiple Vitamins-Minerals (MULTIVITAMIN WITH MINERALS) tablet Take 1 tablet by mouth daily.   Yes Historical Provider, MD  spironolactone (ALDACTONE) 25 MG tablet Take 1 tablet (25 mg total) by mouth daily. For heart condition 03/08/17  Yes Anders Simmonds, PA-C  valACYclovir (VALTREX) 500 MG tablet Take 1 tablet (500 mg total) by mouth daily. For Herpes. 04/18/16  Yes Gardiner Barefoot, MD  azithromycin [facility]) 250 MG tablet Take 2 today then 1 daily 03/08/17   Anders Simmonds, PA-C  losartan (COZAAR) 50 MG tablet Take 1 tablet (50 mg total) by mouth daily. 03/08/17   Anders Simmonds, PA-C  predniSONE (DELTASONE) 10 MG tablet 6,5,4,3,2,1 take each days dose in am with food 03/08/17   Anders Simmonds, PA-C     Objective:  EXAM:   Vitals:   03/08/17 1124  BP: 119/79  Pulse: 95  Temp: 98.1 F (36.7 C)  TempSrc: Oral  SpO2: 98%  Weight: 198 lb 3.2 oz (89.9 kg)    General appearance : A&OX3. NAD. Non-toxic-appearing HEENT: Atraumatic and  Normocephalic.  PERRLA. EOM intact.  TM clear B. Mouth-MMM, post pharynx WNL w/o erythema, No PND. Neck: supple, no JVD. No cervical lymphadenopathy. No thyromegaly Chest/Lungs:  Breathing-non-labored, Good air entry bilaterally, +diffuse mild to moderate wheezing throughout.  No rales or rhonchi CVS: S1 S2 regular, no murmurs, gallops, rubs . Extremities: Bilateral Lower Ext shows no edema, both legs are warm to touch with = pulse throughout Neurology:  CN II-XII grossly intact, Non focal.   Psych:  TP linear. J/I WNL. Normal speech. Appropriate eye contact and affect.  Skin:  No Rash  Data Review Lab Results  Component Value Date   HGBA1C 5.3 09/24/2015   HGBA1C 5.3 08/08/2015   HGBA1C 5.3 06/29/2011     Assessment & Plan   1. Chronic systolic heart failure (HCC) - furosemide (LASIX) 40 MG tablet; Take 1 tablet (40 mg total) by mouth daily.  Dispense: 30 tablet; Refill: 1 - spironolactone (ALDACTONE) 25 MG tablet; Take 1 tablet (25 mg total) by mouth daily. For heart condition  Dispense: 30 tablet; Refill: 3 - digoxin (LANOXIN) 0.125 MG tablet; Take 1 tablet (0.125 mg total) by mouth daily. For control of heart arrythmia.  Dispense: 30 tablet; Refill: 0  ( I filled this because he only has a few tabs left.) Keep f/up appt with cardiology for April 3. 2018  2. Secondary hypertension Controlled-continue- - metoprolol succinate (TOPROL-XL) 25 MG 24 hr tablet; Take 1 tablet (25 mg total) by mouth at bedtime. For high blood pressure  Dispense: 30 tablet; Refill: 3 - losartan (COZAAR) 50 MG tablet; Take 1 tablet (50 mg total) by mouth daily.  Dispense: 30 tablet; Refill: 3 - Comprehensive metabolic panel - CBC with Differential/Platelet  3. Bronchitis - albuterol (PROVENTIL HFA;VENTOLIN HFA) 108 (90 Base) MCG/ACT inhaler; Inhale 2 puffs into the lungs every 6 (six) hours as needed for wheezing or shortness of breath.  Dispense: 1 Inhaler; Refill: 0 - azithromycin (ZITHROMAX) 250 MG  tablet; Take 2 today then 1 daily  Dispense: 6 tablet; Refill: 0 - predniSONE (DELTASONE) 10 MG tablet; 6,5,4,3,2,1 take each days dose in am with food  Dispense: 21 tablet; Refill: 0 He has tolerated prednisone in the past without any problem. Discussed steroid prescription with Viann Fish, clinical pharmacist. Needs to stop smoking but not ready!  Patient have been counseled extensively about nutrition and exercise  Return in about 3 months (around 06/08/2017) for assign PCP; htn.  The patient was given clear instructions to go to ER or return to medical center if symptoms don't improve, worsen or new problems develop. The patient verbalized understanding. The patient was told to call to get lab results if they haven't heard anything in the next week.     Georgian Co, PA-C [facility]  [place], [place] [phone]   03/08/2017, 1:24 PMPatient ID: [name], male   DOB: May 02, 1961, 63 y.o.   MRN: [id number]

## 2017-03-08 NOTE — Progress Notes (Signed)
CSW referred to follow up with patient regarding multiple no -show appointments in the HF clinic. Patient came to the clinic today to meet with CSW and Idelle Jo, HF receptionist. Patient admits to multiple missed appointments and spoke at length about multiple housing issues and inability to find "good housing". Patient also spoke of his mental health issues and recent depression. Patient states he has a Publishing rights manager where he goes during the day for support and has access to needed health services. Patient identified transportation and limited finances as barriers to some of his follow up. CSW discussed additional supportive services through the paramedicine program to assist with compliance and reminders for appointments. Patient agreeable to paramedicine program and values the support. CSW also gave patient 2 bus passes and a [employer] gift card as he stated he was in need of some food and toiletries. Patient will follow up with paramedicine program next week and return for scheduled appointment in 2 weeks. Patient agreeable to plan and will reach out to CSW if needed. CSW will follow up with patient on next clinic visit for additional support and follow up. Lasandra Beech, LCSW, CCSW-MCS [phone]

## 2017-03-14 ENCOUNTER — Other Ambulatory Visit (HOSPITAL_COMMUNITY): Payer: Self-pay

## 2017-03-14 NOTE — Progress Notes (Signed)
Paramedicine Encounter    Patient ID: Ryan Long, male    DOB: [birth year], 56 y.o.   MRN: [id number]   Patient Care Team: Jaclyn Shaggy, MD as PCP - General (Family Medicine)  Patient Active Problem List   Diagnosis Date Noted  . Screening examination for venereal disease 04/18/2016  . Encounter for long-term (current) use of medications 04/18/2016  . Major depressive disorder, recurrent (HCC) 10/08/2015  . COPD (chronic obstructive pulmonary disease) (HCC) 10/04/2015  . Abnormal thyroid stimulating hormone (TSH) level 10/04/2015  . Severe episode of recurrent major depressive disorder, without psychotic features (HCC)   . MDD (major depressive disorder), recurrent episode, severe (HCC) 09/23/2015  . HTN (hypertension) 09/10/2015  . MDD (major depressive disorder), recurrent, severe, with psychosis (HCC) 08/07/2015  . PTSD (post-traumatic stress disorder) 08/07/2015  . Cocaine use disorder, severe, dependence (HCC) 08/07/2015  . Alcohol use disorder, severe, dependence (HCC) 08/07/2015  . Tobacco use disorder 08/07/2015  . Suicidal ideation   . Polysubstance (excluding opioids) dependence with physiological dependence (HCC) 12/30/2013  . Substance induced mood disorder (HCC) 11/22/2013  . Cocaine abuse, episodic use 03/20/2012    Class: Acute  . Alcohol dependence (HCC) 03/20/2012    Class: Acute  . Chronic systolic heart failure (HCC)   . Cardiomyopathy, nonischemic (HCC) 08/24/2011  . Human immunodeficiency virus (HIV) disease (HCC) 02/01/2009  . Recurrent HSV (herpes simplex virus) 02/01/2009  . HYPERLIPIDEMIA 02/01/2009  . ALLERGIC RHINITIS 02/01/2009    Current Outpatient Prescriptions:  .  aspirin 81 MG chewable tablet, Chew 1 tablet (81 mg total) by mouth every morning. For heart health, Disp: , Rfl:  .  digoxin (LANOXIN) 0.125 MG tablet, Take 1 tablet (0.125 mg total) by mouth daily. For control of heart arrythmia., Disp: 30 tablet, Rfl: 0 .  dolutegravir (TIVICAY) 50  MG tablet, Take 1 tablet (50 mg total) by mouth daily., Disp: 30 tablet, Rfl: 11 .  emtricitabine-tenofovir AF (DESCOVY) 200-25 MG tablet, Take 1 tablet by mouth daily., Disp: 30 tablet, Rfl: 11 .  furosemide (LASIX) 40 MG tablet, Take 1 tablet (40 mg total) by mouth daily., Disp: 30 tablet, Rfl: 1 .  hydrOXYzine (ATARAX/VISTARIL) 25 MG tablet, 1 to 2 tabs at night for sleep (Patient taking differently: Take 25-50 mg by mouth at bedtime. ), Disp: 60 tablet, Rfl: 3 .  losartan (COZAAR) 50 MG tablet, Take 1 tablet (50 mg total) by mouth daily., Disp: 30 tablet, Rfl: 3 .  metoprolol succinate (TOPROL-XL) 25 MG 24 hr tablet, Take 1 tablet (25 mg total) by mouth at bedtime. For high blood pressure, Disp: 30 tablet, Rfl: 3 .  Multiple Vitamins-Minerals (MULTIVITAMIN WITH MINERALS) tablet, Take 1 tablet by mouth daily., Disp: , Rfl:  .  spironolactone (ALDACTONE) 25 MG tablet, Take 1 tablet (25 mg total) by mouth daily. For heart condition, Disp: 30 tablet, Rfl: 3 .  valACYclovir (VALTREX) 500 MG tablet, Take 1 tablet (500 mg total) by mouth daily. For Herpes., Disp: 30 tablet, Rfl: 11 .  albuterol (PROVENTIL HFA;VENTOLIN HFA) 108 (90 Base) MCG/ACT inhaler, Inhale 2 puffs into the lungs every 6 (six) hours as needed for wheezing or shortness of breath., Disp: 1 Inhaler, Rfl: 0 .  azithromycin (ZITHROMAX) 250 MG tablet, Take 2 today then 1 daily, Disp: 6 tablet, Rfl: 0 .  predniSONE (DELTASONE) 10 MG tablet, 6,5,4,3,2,1 take each days dose in am with food, Disp: 21 tablet, Rfl: 0 No Known Allergies   Social History   Social History  .  Marital status: Single    Spouse name: N/A  . Number of children: N/A  . Years of education: N/A   Occupational History  . Not on file.   Social History Main Topics  . Smoking status: Current Every Day Smoker    Packs/day: 0.50    Years: 30.00    Types: Cigarettes  . Smokeless tobacco: Never Used     Comment: 15 cigarettes daily  . Alcohol use 3.6 oz/week    6  Cans of beer per week     Comment: reports drinks 6+ beers 3 times a month  . Drug use: Yes    Types: Cocaine     Comment: reports uses crack cocaine 3 times a month  . Sexual activity: Yes    Birth control/ protection: Condom     Comment: pt. given condoms   Other Topics Concern  . Not on file   Social History Narrative  . No narrative on file    Physical Exam  Eyes: Pupils are equal, round, and reactive to light.  Abdominal: He exhibits no distension. There is no tenderness. There is no guarding.  Skin: He is not diaphoretic.         Community Paramedic Living Environment - 03/14/17 1500      Outside of House   Sidewalk and pathway to house is level and free from any hazards Yes   Driveway is free from debris/snow/ice Yes   Outside stairs are stable and have sturdy handrail Yes   Porch lights are working and provide adequate lighting Yes     Living Room   Furniture is of adequate height and offers arm rests that assist in getting up and down Yes   Floor is free from any clutter that would create tripping hazards Yes   All cords are either behind furniture or secured in a manner that does not cause trip hazards Yes   All rugs are secured to floor with double-sided tape N/A   Lighting is adequate to light room Yes   All lighting has an easily accessible on/off switch Yes   Phone is readily accessible near favorite seating areas Yes   Emergency numbers are printed near all phones in house Yes     Kitchen   Items used most often are within easy reach on low shelves Yes   Step stool is present, is sturdy and has a handrail No   Floor mats are non-slip tread and secured to floor No   Oven controls are within easy reach Yes   Kitchen lighting is adequate and easy to reach switches Yes   ABC fire extinguisher is located in kitchen Yes     Stairs   Carpet is properly secured to stairs and/or all wood is properly secured Yes   Handrail is present and sturdy N/A   Stairs  are free from any clutter Yes   Stairway is adequately lit N/A     Bathroom   Tub and shower have a non-slip surface Yes   Tub and/or shower have a grab bar for stability No   Toilet has a raised seat No   Grab bar is attached near toilet for assistance No   Pathway from bedroom to bathroom is free from clutter and well lit for ease of movement in the middle of the night Yes     Bedroom   Floor is free from clutter Yes   Light is near bed and is easy to turn on Yes  Phone is next to bed and within easy reach Yes   Flashlight is near bed in case of emergency No     General   Smoke detectors in all areas of the house (each floor) and tested Yes   CO detectors on each floor of house and tested Yes   Flashlights are handy throughout the home No       Future Appointments Date Time Provider Department Center  03/20/2017 10:30 AM [facility] PA/NP [facility] None  04/02/2017 10:15 AM [facility] LAB [facility]  04/16/2017 10:15 AM Randall Hiss, MD [facility]  06/11/2017 8:30 AM Jaclyn Shaggy, MD [facility] None   Initial visit ATF pt CAO x4 with no complaints today. Pt denies sob, dizziness, headaches and chest pain.  Pt fills his own pill box with no difficulties. Pt had not been weighing himself because he did not have a scale, therefore I gave him a scale.  Pt needs more information on the low sodium diet and education on low sodium foods. Pt was advised on the low fluid intake and the health issues if he is not compliant with both. Pt understands when and how to take his medications.  Pt stated that he would like to move out of the boarding house, where he lives now. Pt's income is disability $76; he receives [place] and medicare; no food stamps (case worker stated that his case is under review).  Pt Is open to housing authority apartments.  All doctors are in [place].  *Been taking .5 spirolactone daily although he is aware that he need to take 1 whole pill daily, due to  financial issues.  *Out of losartan as of yesterday  BP 124/82 (BP Location: Left Arm, Patient Position: Sitting, Cuff Size: Normal)   Pulse (!) 106   Wt 194 lb 9.6 oz (88.3 kg)   SpO2 98%   BMI 28.74 kg/m   Weight yesterday-didn't weigh    ACTION: Home visit completed

## 2017-03-20 ENCOUNTER — Ambulatory Visit (HOSPITAL_COMMUNITY)
Admission: RE | Admit: 2017-03-20 | Discharge: 2017-03-20 | Disposition: A | Discharge status: Home / Self Care | Payer: Medicare Other | Source: Ambulatory Visit | Attending: Student | Admitting: Student

## 2017-03-20 ENCOUNTER — Ambulatory Visit (HOSPITAL_COMMUNITY)
Admission: RE | Admit: 2017-03-20 | Discharge: 2017-03-20 | Disposition: A | Discharge status: Home / Self Care | Payer: Medicare Other | Source: Ambulatory Visit | Attending: Internal Medicine | Admitting: Internal Medicine

## 2017-03-20 ENCOUNTER — Other Ambulatory Visit (HOSPITAL_COMMUNITY): Payer: Self-pay

## 2017-03-20 VITALS — BP 124/86 | HR 94 | Wt 197.6 lb

## 2017-03-20 DIAGNOSIS — F329 Major depressive disorder, single episode, unspecified: Secondary | ICD-10-CM | POA: Diagnosis not present

## 2017-03-20 DIAGNOSIS — I5022 Chronic systolic (congestive) heart failure: Secondary | ICD-10-CM

## 2017-03-20 DIAGNOSIS — I110 Hypertensive heart disease with heart failure: Secondary | ICD-10-CM | POA: Diagnosis not present

## 2017-03-20 DIAGNOSIS — B20 Human immunodeficiency virus [HIV] disease: Secondary | ICD-10-CM | POA: Diagnosis not present

## 2017-03-20 DIAGNOSIS — J438 Other emphysema: Secondary | ICD-10-CM

## 2017-03-20 DIAGNOSIS — M47814 Spondylosis without myelopathy or radiculopathy, thoracic region: Secondary | ICD-10-CM | POA: Insufficient documentation

## 2017-03-20 DIAGNOSIS — R062 Wheezing: Secondary | ICD-10-CM | POA: Diagnosis not present

## 2017-03-20 DIAGNOSIS — F1910 Other psychoactive substance abuse, uncomplicated: Secondary | ICD-10-CM | POA: Diagnosis not present

## 2017-03-20 DIAGNOSIS — R6883 Chills (without fever): Secondary | ICD-10-CM

## 2017-03-20 DIAGNOSIS — I429 Cardiomyopathy, unspecified: Secondary | ICD-10-CM | POA: Diagnosis not present

## 2017-03-20 DIAGNOSIS — E785 Hyperlipidemia, unspecified: Secondary | ICD-10-CM | POA: Diagnosis not present

## 2017-03-20 DIAGNOSIS — I159 Secondary hypertension, unspecified: Secondary | ICD-10-CM | POA: Diagnosis not present

## 2017-03-20 DIAGNOSIS — F1420 Cocaine dependence, uncomplicated: Secondary | ICD-10-CM | POA: Diagnosis not present

## 2017-03-20 DIAGNOSIS — F319 Bipolar disorder, unspecified: Secondary | ICD-10-CM | POA: Insufficient documentation

## 2017-03-20 DIAGNOSIS — F4310 Post-traumatic stress disorder, unspecified: Secondary | ICD-10-CM | POA: Insufficient documentation

## 2017-03-20 DIAGNOSIS — J069 Acute upper respiratory infection, unspecified: Secondary | ICD-10-CM | POA: Insufficient documentation

## 2017-03-20 DIAGNOSIS — F17210 Nicotine dependence, cigarettes, uncomplicated: Secondary | ICD-10-CM | POA: Insufficient documentation

## 2017-03-20 DIAGNOSIS — Z21 Asymptomatic human immunodeficiency virus [HIV] infection status: Secondary | ICD-10-CM

## 2017-03-20 DIAGNOSIS — Z7982 Long term (current) use of aspirin: Secondary | ICD-10-CM | POA: Diagnosis not present

## 2017-03-20 DIAGNOSIS — J449 Chronic obstructive pulmonary disease, unspecified: Secondary | ICD-10-CM | POA: Diagnosis not present

## 2017-03-20 DIAGNOSIS — R05 Cough: Secondary | ICD-10-CM | POA: Diagnosis not present

## 2017-03-20 DIAGNOSIS — F17200 Nicotine dependence, unspecified, uncomplicated: Secondary | ICD-10-CM | POA: Diagnosis not present

## 2017-03-20 LAB — COMPREHENSIVE METABOLIC PANEL
ALT: 27 U/L (ref 17–63)
AST: 27 U/L (ref 15–41)
Albumin: 3.7 g/dL (ref 3.5–5.0)
Alkaline Phosphatase: 41 U/L (ref 38–126)
Anion gap: 7 (ref 5–15)
BUN: 9 mg/dL (ref 6–20)
CO2: 22 mmol/L (ref 22–32)
CREATININE: 1.08 mg/dL (ref 0.61–1.24)
Calcium: 8.6 mg/dL — ABNORMAL LOW (ref 8.9–10.3)
Chloride: 111 mmol/L (ref 101–111)
GFR calc Af Amer: >60 mL/min (ref >60)
GLUCOSE: 92 mg/dL (ref 65–99)
Potassium: 4.3 mmol/L (ref 3.5–5.1)
SODIUM: 140 mmol/L (ref 135–145)
Total Bilirubin: 1 mg/dL (ref 0.3–1.2)
Total Protein: 5.9 g/dL — ABNORMAL LOW (ref 6.5–8.1)

## 2017-03-20 LAB — CBC
HCT: 47.5 % (ref 39.0–52.0)
Hemoglobin: 17.1 g/dL — ABNORMAL HIGH (ref 13.0–17.0)
MCH: 33.9 pg (ref 26.0–34.0)
MCHC: 36 g/dL (ref 30.0–36.0)
MCV: 94.2 fL (ref 78.0–100.0)
PLATELETS: 158 10*3/uL (ref 150–400)
RBC: 5.04 MIL/uL (ref 4.22–5.81)
RDW: 13.7 % (ref 11.5–15.5)
WBC: 2.3 10*3/uL — AB (ref 4.0–10.5)

## 2017-03-20 LAB — INFLUENZA PANEL BY PCR (TYPE A & B)
Influenza A By PCR: NEGATIVE
Influenza B By PCR: NEGATIVE

## 2017-03-20 NOTE — Progress Notes (Signed)
Advanced Heart Failure Medication Review by a Pharmacist  Does the patient  feel that his/her medications are working for him/her?  yes  Has the patient been experiencing any side effects to the medications prescribed?  no  Does the patient measure his/her own blood pressure or blood glucose at home?  no   Does the patient have any problems obtaining medications due to transportation or finances?   no  Understanding of regimen: fair Understanding of indications: fair Potential of compliance: fair Patient understands to avoid NSAIDs. Patient understands to avoid decongestants.  Issues to address at subsequent visits: Compliance   Pharmacist comments:  Ryan Long is a pleasant 56 yo M presenting with his medication bottles. He reports good compliance with his regimen but did admit to missing 1 day of spironolactone and furosemide 2/2 forgetting to take them when he got back home that day. He also admits to only taking 1/2 spironolactone daily 2/2 the instructions on his pill bottle. He also states that he was recently treated for a bronchitis but he feels like he may be having a recurrence. He does have an appointment with his PCP tomorrow to address this.   [name]. Bonnye Fava, PharmD, BCPS, CPP Clinical Pharmacist Pager: [phone] Phone: [phone] 03/20/2017 11:12 AM      Time with patient: 10 minutes Preparation and documentation time: 2 minurtes Total time: 12 minutes

## 2017-03-20 NOTE — Progress Notes (Signed)
Paramedicine Encounter    Patient ID: Ryan Long, male    DOB: Feb 22, 1961, 56 y.o.   MRN: [id number]  Patient Care Team: Arnoldo Morale, MD as PCP - General (Family Medicine)  Patient Active Problem List   Diagnosis Date Noted  . Screening examination for venereal disease 04/18/2016  . Encounter for long-term (current) use of medications 04/18/2016  . Major depressive disorder, recurrent ([place]) 10/08/2015  . COPD (chronic obstructive pulmonary disease) ([place]) 10/04/2015  . Abnormal thyroid stimulating hormone (TSH) level 10/04/2015  . Severe episode of recurrent major depressive disorder, without psychotic features ([place])   . MDD (major depressive disorder), recurrent episode, severe ([place]) 09/23/2015  . HTN (hypertension) 09/10/2015  . MDD (major depressive disorder), recurrent, severe, with psychosis ([place]) 08/07/2015  . PTSD (post-traumatic stress disorder) 08/07/2015  . Cocaine use disorder, severe, dependence ([place]) 08/07/2015  . Alcohol use disorder, severe, dependence ([place]) 08/07/2015  . Tobacco use disorder 08/07/2015  . Suicidal ideation   . Polysubstance (excluding opioids) dependence with physiological dependence ([place]) 12/30/2013  . Substance induced mood disorder ([place]) 11/22/2013  . Cocaine abuse, episodic use 03/20/2012    Class: Acute  . Alcohol dependence ([place]) 03/20/2012    Class: Acute  . Chronic systolic heart failure ([place])   . Cardiomyopathy, nonischemic ([place]) 08/24/2011  . Human immunodeficiency virus (HIV) disease ([place]) 02/01/2009  . Recurrent HSV (herpes simplex virus) 02/01/2009  . HYPERLIPIDEMIA 02/01/2009  . ALLERGIC RHINITIS 02/01/2009    Current Outpatient Prescriptions:  .  albuterol (PROVENTIL HFA;VENTOLIN HFA) 108 (90 Base) MCG/ACT inhaler, Inhale 2 puffs into the lungs every 6 (six) hours as needed for wheezing or shortness of breath., Disp: 1 Inhaler, Rfl: 0 .  aspirin 81 MG chewable tablet, Chew 1 tablet (81 mg total) by mouth every morning. For heart  health, Disp: , Rfl:  .  digoxin (LANOXIN) 0.125 MG tablet, Take 1 tablet (0.125 mg total) by mouth daily. For control of heart arrythmia., Disp: 30 tablet, Rfl: 0 .  dolutegravir (TIVICAY) 50 MG tablet, Take 1 tablet (50 mg total) by mouth daily., Disp: 30 tablet, Rfl: 11 .  emtricitabine-tenofovir AF (DESCOVY) 200-25 MG tablet, Take 1 tablet by mouth daily., Disp: 30 tablet, Rfl: 11 .  furosemide (LASIX) 40 MG tablet, Take 1 tablet (40 mg total) by mouth daily., Disp: 30 tablet, Rfl: 1 .  hydrOXYzine (ATARAX/VISTARIL) 25 MG tablet, Take 25-50 mg by mouth at bedtime., Disp: , Rfl:  .  losartan (COZAAR) 50 MG tablet, Take 1 tablet (50 mg total) by mouth daily., Disp: 30 tablet, Rfl: 3 .  metoprolol succinate (TOPROL-XL) 25 MG 24 hr tablet, Take 1 tablet (25 mg total) by mouth at bedtime. For high blood pressure, Disp: 30 tablet, Rfl: 3 .  Multiple Vitamins-Minerals (MULTIVITAMIN WITH MINERALS) tablet, Take 1 tablet by mouth daily., Disp: , Rfl:  .  Phenir-PE-Sod Sal-Caff Cit (COUGH/COLD MEDICINE PO), Take 1 tablet by mouth daily., Disp: , Rfl:  .  spironolactone (ALDACTONE) 25 MG tablet, Take 12.5 mg by mouth daily., Disp: , Rfl:  .  valACYclovir (VALTREX) 500 MG tablet, Take 1 tablet (500 mg total) by mouth daily. For Herpes., Disp: 30 tablet, Rfl: 11 No Known Allergies   Social History   Social History  . Marital status: Single    Spouse name: N/A  . Number of children: N/A  . Years of education: N/A   Occupational History  . Not on file.   Social History Main Topics  . Smoking status: Current Every  Day Smoker    Packs/day: 0.50    Years: 30.00    Types: Cigarettes  . Smokeless tobacco: Never Used     Comment: 15 cigarettes daily  . Alcohol use 3.6 oz/week    6 Cans of beer per week     Comment: reports drinks 6+ beers 3 times a month  . Drug use: Yes    Types: Cocaine     Comment: reports uses crack cocaine 3 times a month  . Sexual activity: Yes    Birth control/  protection: Condom     Comment: pt. given condoms   Other Topics Concern  . Not on file   Social History Narrative  . No narrative on file    Physical Exam  Eyes: Pupils are equal, round, and reactive to light.  Pulmonary/Chest: No respiratory distress. He has wheezes. He has no rales.  Neurological: He is alert.  Skin: Skin is warm and dry. He is not diaphoretic.          ReDS Vest - 03/20/17 1000      ReDS Vest   Fitting Posture Standing   Height Marker Tall   Ruler Value 10.5   Center Strip Aligned   ReDS Value 31      Future Appointments Date Time Provider [place]  03/21/2017 10:00 AM [facility] None  04/02/2017 10:15 AM [facility] LAB [facility]  04/16/2017 10:15 AM Truman Hayward, MD [facility]  06/11/2017 8:30 AM Arnoldo Morale, MD [facility] None   Clinic Met pt in the clinic, pt stated that he thinks that  respirtory infection has come back. Pt stated that he has been taking over the counter medications for his symptoms.  Pt stated that he has had a fever. Pt denies sob, headache, and chest pain.  Pt did not take lasix and spiro prior to today's visit.  Pt stated that he didn't take lasix due to "running back and forth to the bathroom", therefore he will take it when he gets back home.   Vest reading 31%  Andy:  Little fluid in his legs. Some wheezing possibly due to respiratory infection (hx of bronchitis).  BP 124/86 (BP Location: Right Arm, Patient Position: Sitting, Cuff Size: Normal)   Pulse 94   Resp 16   Ht 5' 10.8" (1.798 m)   Wt 194 lb 6.4 oz (88.2 kg)   SpO2 96%   BMI 27.27 kg/m   Weight yesterday-195.0 Last visit weight-194    ACTION: Home visit completed

## 2017-03-20 NOTE — Progress Notes (Signed)
Advanced Heart Failure Clinic Note   Patient ID: Ryan Long, male   DOB: Apr 26, 1961, 56 y.o.   MRN: [id number] ID: Dr Daiva Eves PCP: Dr Roselle Locus Donahoe is a 56 y.o. male with a history of HIV, hypertension, major depressive disorder, chronic systolic heart failure (nonischemic cardiomyopathy), and polysubstance abuse.  Coronary CT angiogram in 3/12 demonstrated no CAD.  Cardiolite in 2015 showed no ischemia or infarction.  NICM was not felt to be related to HIV given his good CD4 counts.    Pt presents today for follow up. Feeling OK overall but struggling with URI, treated with azithromycin last week.  Initially felt better but feeling bad again.  + chills and productive cough.  Smoking > 1/2 pack per day. States he hasn't had cocaine since February.  Breathing OK on flat ground at baseline, a little more SOB and + wheezing with his URI. + body aches. Occasionally skips his lasix if he is going to be out and about.  No joint pain, difficulty urinating, or near-sycnope. Does feel a little more weak than normal.   ECHO 06/2013 EF 35-40% ECHO 9/16 with EF 30-35%, diffuse hypokinesis, grade I diastolic dysfunction,   Past Medical History:  Diagnosis Date  . Active smoker   . Alcohol abuse   . Anxiety   . AR (allergic rhinitis)   . Bipolar 1 disorder (HCC)   . CHF (congestive heart failure) (HCC)   . Chronic systolic heart failure (HCC)   . COPD (chronic obstructive pulmonary disease) (HCC)   . Crack cocaine use   . Depression   . Genital herpes   . HIV (human immunodeficiency virus infection) (HCC)   . HLD (hyperlipidemia)   . Hypertension   . NICM (nonischemic cardiomyopathy) (HCC)    Echocardiogram 06/28/11: EF 30-35%, mild MR, mild LAE;  No CAD by coronary CT angiogram 3/12 at [facility]  . NSVT (nonsustained ventricular tachycardia) (HCC)   . PTSD (post-traumatic stress disorder)     Current Outpatient Prescriptions  Medication Sig Dispense Refill  .  albuterol (PROVENTIL HFA;VENTOLIN HFA) 108 (90 Base) MCG/ACT inhaler Inhale 2 puffs into the lungs every 6 (six) hours as needed for wheezing or shortness of breath. 1 Inhaler 0  . aspirin 81 MG chewable tablet Chew 1 tablet (81 mg total) by mouth every morning. For heart health    . digoxin (LANOXIN) 0.125 MG tablet Take 1 tablet (0.125 mg total) by mouth daily. For control of heart arrythmia. 30 tablet 0  . dolutegravir (TIVICAY) 50 MG tablet Take 1 tablet (50 mg total) by mouth daily. 30 tablet 11  . emtricitabine-tenofovir AF (DESCOVY) 200-25 MG tablet Take 1 tablet by mouth daily. 30 tablet 11  . furosemide (LASIX) 40 MG tablet Take 1 tablet (40 mg total) by mouth daily. 30 tablet 1  . hydrOXYzine (ATARAX/VISTARIL) 25 MG tablet Take 25-50 mg by mouth at bedtime.    Marland Kitchen losartan (COZAAR) 50 MG tablet Take 1 tablet (50 mg total) by mouth daily. 30 tablet 3  . metoprolol succinate (TOPROL-XL) 25 MG 24 hr tablet Take 1 tablet (25 mg total) by mouth at bedtime. For high blood pressure 30 tablet 3  . Multiple Vitamins-Minerals (MULTIVITAMIN WITH MINERALS) tablet Take 1 tablet by mouth daily.    Marland Kitchen Phenir-PE-Sod Sal-Caff Cit (COUGH/COLD MEDICINE PO) Take 1 tablet by mouth daily.    Marland Kitchen spironolactone (ALDACTONE) 25 MG tablet Take 12.5 mg by mouth daily.    . valACYclovir (VALTREX) 500  MG tablet Take 1 tablet (500 mg total) by mouth daily. For Herpes. 30 tablet 11   No current facility-administered medications for this encounter.     Allergies: No Known Allergies   Review of systems complete and found to be negative unless listed in HPI.     Vital Signs: BP 124/86 (BP Location: Left Arm, Patient Position: Sitting, Cuff Size: Normal)   Pulse 94   Wt 197 lb 9.6 oz (89.6 kg)   SpO2 96%   BMI 29.18 kg/m   Vitals:   03/20/17 1040  BP: 124/86  Pulse: 94  SpO2: 96%  Weight: 197 lb 9.6 oz (89.6 kg)     PHYSICAL EXAM: General. WDWN. Fatigued AA male in NAD. HEENT: Normal.  Neck: Supple. JVP  6-7 cm. Carotids 2+ bilat; no bruits. No thyromegaly or nodule noted.    Cardiac: PMI non-displaced. RRR. No M/G/R noted.   Lungs:  Mildly diminished basilar sounds. Faint expiratory wheezes throughout.  No rales.  Abd: Soft, NT, ND, no HSM. No bruits or masses. +BS  Ext: No cyanosis, clubbing, rash, or edema.  Skin: Warm and Dry.  Neuro:  Alert & oriented x 3. Cranial nerves grossly intact. Moves all 4 extremities w/o difficulty.  Psych: Normal affect.   ASSESSMENT AND PLAN:   1. Chronic Systolic Heart Failure: Nonischemic cardiomyopathy.  Echo (9/16) with EF 30-35%, diffuse hypokinesis.  - NYHA class III currently with URI - Volume status looks OK on exam.   - Encouraged to take lasix daily as recommended. Continue lasix 40 mg daily.  - Continue toprol xl 25 mg daily.  - Continue spiro 12.5 mg daily - Continue digoxin 0.125 mg daily. Level stable 09/25/2016 - Will plan on repeat Echo next visit. If considered for ICD, would need to be drug free.   2. HIV:  - Followed by  Dr Daiva Eves. Sees again at end of month.  3. Tobacco use disorder:   - Continues to smoke ~ 3/4 of a pack daily. Encouraged to quit completely.   4. Depression and crack cocaine use: Ongoing. - He states he last used drugs 2 months ago. Encouraged to remain abstinent.  5. URI on COPD. - Discussed in-depth with ID - Dr. Daiva Eves. He recommended we defer ABX for now, and check CXR, Blood cultures, Flu PCR, CBC, and CMET today in clinic.  Pt has follow up at [facility] tomorrow, so they will be able to review all results.   Graciella Freer, PA-C  03/20/2017   Greater than 50% of the 25 minute visit was spent in counseling/coordination of care regarding URI in HIV patient, discussion with ID, reviewing medication regimen with onsite Pharm-D, and disease state education.

## 2017-03-20 NOTE — Progress Notes (Signed)
Paramedicine Multidisciplinary Team Update  Ryan Long is enrolled in the [employer] Paramedicine Program through [facility] Advanced Heart Failure [facility].  The patient presents today in association with an Advanced Heart Failure Clinic Appointment. Patient indicated he was feeling better and positive about his healthy living. Patient referenced he has made 2 scheduled appointments, taking his medications and trying to eat healthier. He was pleased with himself as he had previously had multiple missed and cancelled appointments. Patient identified the added support of HF clinic staff and Paramedicine program as contributing to his improved compliance.   Patient living/home environment and social support-- Patient currently resides in a rooming house in [place] and would like to make a move to [place] in the near future. Patient has limited social supports but does attend a support program in [place]. Patient appreciative of the support received and appears motivated for continued health improvements.   Insurance/ Prescription Coverage-- Medicare  Does the patient have a scale and weigh each day? Patient verbalizes daily weights.  Does the patient follow a low salt diet? Patient making attempts although admits to recent use of seasonings. CSW and Paramedic discussed options and provided [employer] gift card to assist with added groceries for diet compliance.  Is patient compliant with medications? Patient verbalizes compliance with medications and reports feeling well.  Does the patient have transportation for physician appointments? CSW provided bus passes as transportation continues to be an issue. Patient unable to take single bus to appointments due to location of home. CSW and patient will work together to explore housing options/transportation in [place].  Does the patient contact the HF Clinic appropriately with worsening symptoms or weight increases? Patient will reach out to  paramedic   Are there any identified obstacles / challenges for adherence to current treatment plan? Patient is motivated and encouraged by his improved health although transportation continues to be a barrier to getting to appointments. CSW will continue to work with patient through paramedicine program on housing and supportive interventions.   Lasandra Beech, LCSW, CCSW-MCS [phone]

## 2017-03-20 NOTE — Patient Instructions (Signed)
No changes to medication at this time.  Chest xray today.  Routine lab work today. Will notify you of abnormal results, otherwise no news is good news!  Follow up with [facility] and Dr. Algis Liming as scheduled.  Follow up with Dr. Gala Romney in 2 months.  Do the following things EVERYDAY: 1) Weigh yourself in the morning before breakfast. Write it down and keep it in a log. 2) Take your medicines as prescribed 3) Eat low salt foods-Limit salt (sodium) to 2000 mg per day.  4) Stay as active as you can everyday 5) Limit all fluids for the day to less than 2 liters

## 2017-03-21 ENCOUNTER — Ambulatory Visit: Payer: Self-pay

## 2017-03-25 LAB — CULTURE, BLOOD (ROUTINE X 2)
CULTURE: NO GROWTH
Special Requests: ADEQUATE

## 2017-03-27 ENCOUNTER — Telehealth (HOSPITAL_COMMUNITY): Payer: Self-pay

## 2017-03-27 NOTE — Telephone Encounter (Signed)
Pt texted me several times yesterday about housing information but I was not in the office (day off).  Therefore I called him back today to respond to his texts and to re-schedule the appointment that we had for 11am today.  Pt didn't answer. I will f/u with him later today.

## 2017-03-28 ENCOUNTER — Other Ambulatory Visit: Payer: Self-pay | Admitting: Pharmacist

## 2017-03-28 MED ORDER — VENTOLIN HFA 108 (90 BASE) MCG/ACT IN AERS: 2.0000 | INHALATION_SPRAY | Freq: Four times a day (QID) | RESPIRATORY_TRACT | 0 refills | Status: DC | PRN

## 2017-04-02 ENCOUNTER — Other Ambulatory Visit: Payer: Self-pay

## 2017-04-03 ENCOUNTER — Other Ambulatory Visit: Payer: Self-pay

## 2017-04-05 ENCOUNTER — Telehealth: Payer: Self-pay | Admitting: Licensed Clinical Social Worker
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
## 2017-04-07 ENCOUNTER — Encounter: Payer: Self-pay | Admitting: Emergency Medicine

## 2017-04-07 ENCOUNTER — Emergency Department
Admission: EM | Admit: 2017-04-07 | Discharge: 2017-04-09 | Disposition: A | Discharge status: Other Acute Inpatient Hospital | Payer: Medicare Other | Attending: Emergency Medicine | Admitting: Emergency Medicine

## 2017-04-07 DIAGNOSIS — J449 Chronic obstructive pulmonary disease, unspecified: Secondary | ICD-10-CM | POA: Insufficient documentation

## 2017-04-07 DIAGNOSIS — I110 Hypertensive heart disease with heart failure: Secondary | ICD-10-CM | POA: Diagnosis not present

## 2017-04-07 DIAGNOSIS — I5022 Chronic systolic (congestive) heart failure: Secondary | ICD-10-CM | POA: Diagnosis not present

## 2017-04-07 DIAGNOSIS — R45851 Suicidal ideations: Secondary | ICD-10-CM

## 2017-04-07 DIAGNOSIS — Z21 Asymptomatic human immunodeficiency virus [HIV] infection status: Secondary | ICD-10-CM | POA: Insufficient documentation

## 2017-04-07 DIAGNOSIS — Z7982 Long term (current) use of aspirin: Secondary | ICD-10-CM | POA: Diagnosis not present

## 2017-04-07 DIAGNOSIS — B20 Human immunodeficiency virus [HIV] disease: Secondary | ICD-10-CM | POA: Diagnosis present

## 2017-04-07 DIAGNOSIS — Z79899 Other long term (current) drug therapy: Secondary | ICD-10-CM | POA: Insufficient documentation

## 2017-04-07 DIAGNOSIS — F1420 Cocaine dependence, uncomplicated: Secondary | ICD-10-CM | POA: Diagnosis present

## 2017-04-07 DIAGNOSIS — F332 Major depressive disorder, recurrent severe without psychotic features: Secondary | ICD-10-CM | POA: Diagnosis not present

## 2017-04-07 DIAGNOSIS — F1020 Alcohol dependence, uncomplicated: Secondary | ICD-10-CM | POA: Diagnosis present

## 2017-04-07 DIAGNOSIS — F17210 Nicotine dependence, cigarettes, uncomplicated: Secondary | ICD-10-CM | POA: Diagnosis not present

## 2017-04-07 DIAGNOSIS — F322 Major depressive disorder, single episode, severe without psychotic features: Secondary | ICD-10-CM

## 2017-04-07 LAB — COMPREHENSIVE METABOLIC PANEL
ALT: 31 U/L (ref 17–63)
ANION GAP: 8 (ref 5–15)
AST: 34 U/L (ref 15–41)
Albumin: 4.4 g/dL (ref 3.5–5.0)
Alkaline Phosphatase: 58 U/L (ref 38–126)
BUN: 10 mg/dL (ref 6–20)
CHLORIDE: 107 mmol/L (ref 101–111)
CO2: 24 mmol/L (ref 22–32)
Calcium: 9.1 mg/dL (ref 8.9–10.3)
Creatinine, Ser: 1.15 mg/dL (ref 0.61–1.24)
GFR calc Af Amer: >60 mL/min (ref >60)
GFR calc non Af Amer: >60 mL/min (ref >60)
GLUCOSE: 98 mg/dL (ref 65–99)
POTASSIUM: 3.6 mmol/L (ref 3.5–5.1)
SODIUM: 139 mmol/L (ref 135–145)
TOTAL PROTEIN: 7.2 g/dL (ref 6.5–8.1)
Total Bilirubin: 1 mg/dL (ref 0.3–1.2)

## 2017-04-07 LAB — CBC
HCT: 52.2 % — ABNORMAL HIGH (ref 40.0–52.0)
Hemoglobin: 18.1 g/dL — ABNORMAL HIGH (ref 13.0–18.0)
MCH: 33.7 pg (ref 26.0–34.0)
MCHC: 34.6 g/dL (ref 32.0–36.0)
MCV: 97.2 fL (ref 80.0–100.0)
PLATELETS: 196 10*3/uL (ref 150–440)
RBC: 5.37 MIL/uL (ref 4.40–5.90)
RDW: 13.9 % (ref 11.5–14.5)
WBC: 2.7 10*3/uL — AB (ref 3.8–10.6)

## 2017-04-07 LAB — URINE DRUG SCREEN, QUALITATIVE (ARMC ONLY)
AMPHETAMINES, UR SCREEN: NOT DETECTED
BENZODIAZEPINE, UR SCRN: NOT DETECTED
Barbiturates, Ur Screen: NOT DETECTED
Cannabinoid 50 Ng, Ur ~~LOC~~: NOT DETECTED
Cocaine Metabolite,Ur ~~LOC~~: POSITIVE — AB
MDMA (ECSTASY) UR SCREEN: NOT DETECTED
METHADONE SCREEN, URINE: NOT DETECTED
Opiate, Ur Screen: NOT DETECTED
Phencyclidine (PCP) Ur S: NOT DETECTED
Tricyclic, Ur Screen: NOT DETECTED

## 2017-04-07 LAB — ETHANOL

## 2017-04-07 LAB — ACETAMINOPHEN LEVEL

## 2017-04-07 LAB — SALICYLATE LEVEL: Salicylate Lvl: <7 mg/dL (ref 2.8–30.0)

## 2017-04-07 MED ORDER — ASPIRIN 81 MG PO CHEW
81.0000 mg | CHEWABLE_TABLET | Freq: Every morning | ORAL | Status: DC
  Administered 2017-04-08 – 2017-04-09 (×2): 81 mg via ORAL
  Filled 2017-04-07 (×3): qty 1

## 2017-04-07 MED ORDER — LOSARTAN POTASSIUM 50 MG PO TABS
50.0000 mg | ORAL_TABLET | Freq: Every day | ORAL | Status: DC
  Administered 2017-04-08 – 2017-04-09 (×2): 50 mg via ORAL
  Filled 2017-04-07 (×2): qty 1

## 2017-04-07 MED ORDER — ALBUTEROL SULFATE HFA 108 (90 BASE) MCG/ACT IN AERS
2.0000 | INHALATION_SPRAY | Freq: Four times a day (QID) | RESPIRATORY_TRACT | Status: DC | PRN
  Filled 2017-04-07: qty 6.7

## 2017-04-07 MED ORDER — DOLUTEGRAVIR SODIUM 50 MG PO TABS
50.0000 mg | ORAL_TABLET | Freq: Every day | ORAL | Status: DC
  Administered 2017-04-08 – 2017-04-09 (×2): 50 mg via ORAL
  Filled 2017-04-07 (×2): qty 1

## 2017-04-07 MED ORDER — FUROSEMIDE 20 MG PO TABS
40.0000 mg | ORAL_TABLET | Freq: Every day | ORAL | Status: DC
  Administered 2017-04-08 – 2017-04-09 (×2): 40 mg via ORAL
  Filled 2017-04-07 (×3): qty 2

## 2017-04-07 MED ORDER — METOPROLOL SUCCINATE ER 50 MG PO TB24
25.0000 mg | ORAL_TABLET | Freq: Every day | ORAL | Status: DC
  Administered 2017-04-07 – 2017-04-08 (×2): 25 mg via ORAL
  Filled 2017-04-07 (×2): qty 1

## 2017-04-07 MED ORDER — HYDROXYZINE HCL 25 MG PO TABS
25.0000 mg | ORAL_TABLET | Freq: Every day | ORAL | Status: DC
  Administered 2017-04-07 – 2017-04-08 (×2): 50 mg via ORAL
  Filled 2017-04-07: qty 1
  Filled 2017-04-07: qty 2

## 2017-04-07 MED ORDER — ADULT MULTIVITAMIN W/MINERALS CH
1.0000 | ORAL_TABLET | Freq: Every day | ORAL | Status: DC
  Administered 2017-04-08 – 2017-04-09 (×2): 1 via ORAL
  Filled 2017-04-07 (×2): qty 1

## 2017-04-07 MED ORDER — VALACYCLOVIR HCL 500 MG PO TABS
500.0000 mg | ORAL_TABLET | Freq: Every day | ORAL | Status: DC
  Administered 2017-04-08 – 2017-04-09 (×2): 500 mg via ORAL
  Filled 2017-04-07 (×2): qty 1

## 2017-04-07 MED ORDER — EMTRICITABINE-TENOFOVIR AF 200-25 MG PO TABS
1.0000 | ORAL_TABLET | Freq: Every day | ORAL | Status: DC
  Administered 2017-04-08 – 2017-04-09 (×2): 1 via ORAL
  Filled 2017-04-07 (×2): qty 1

## 2017-04-07 MED ORDER — DIGOXIN 125 MCG PO TABS
0.1250 mg | ORAL_TABLET | Freq: Every day | ORAL | Status: DC
  Administered 2017-04-08 – 2017-04-09 (×2): 0.125 mg via ORAL
  Filled 2017-04-07 (×2): qty 1

## 2017-04-07 MED ORDER — SPIRONOLACTONE 12.5 MG HALF TABLET
12.5000 mg | ORAL_TABLET | Freq: Every day | ORAL | Status: DC
  Administered 2017-04-08 – 2017-04-09 (×2): 12.5 mg via ORAL
  Filled 2017-04-07 (×2): qty 1

## 2017-04-07 NOTE — ED Notes (Signed)
Patient conversing with [facility] provider

## 2017-04-07 NOTE — ED Notes (Signed)
Patient ambulated to [facility] room 2, 5 labeled bags were placed in locker BHU2, with one oversize bookbag left outside the locker d/t inadequate space in the locker.

## 2017-04-07 NOTE — ED Notes (Signed)

## 2017-04-07 NOTE — ED Notes (Signed)
[facility] provider called for details of patient

## 2017-04-07 NOTE — ED Notes (Addendum)
Patient medications in red bag (5 of 5 sticker): Valacyclovir 500mg :  41 Hydroxyzine 25mg :  36 Equate Complete Multivitamin:  62 Aspirin 81mg : 5 Furosemide 40mg :  21 Ventolin inhaler: 1 unopened Spironolactone 25mg :  4.5 Digoxin 0.125mg :  24 Metoprolol 25mg :  16 Losartan 50mg :  69 Descovy 200mg /25mg  tablets:  25  Also placed in the patient's bookbag was a brown band colored watch

## 2017-04-07 NOTE — ED Notes (Signed)
PT VOL. PENDING SOC CONSULT  

## 2017-04-07 NOTE — ED Notes (Signed)
ED BHU PLACEMENT JUSTIFICATION Is the patient under IVC or is there intent for IVC: No. Is the patient medically cleared: Yes.   Is there vacancy in the ED BHU: Yes.   Is the population mix appropriate for patient: Yes.   Is the patient awaiting placement in inpatient or outpatient setting: Yes.   Has the patient had a psychiatric consult: Yes.   Survey of unit performed for contraband, proper placement and condition of furniture, tampering with fixtures in bathroom, shower, and each patient room: Yes.   APPEARANCE/BEHAVIOR calm, cooperative and adequate rapport can be established NEURO ASSESSMENT Orientation: place and person Hallucinations: No.None noted (Hallucinations) Speech: Normal Gait: normal RESPIRATORY ASSESSMENT Normal expansion.  Clear to auscultation.  No rales, rhonchi, or wheezing. CARDIOVASCULAR ASSESSMENT regular rate and rhythm, S1, S2 normal, no murmur, click, rub or gallop GASTROINTESTINAL ASSESSMENT soft, nontender, BS WNL, no r/g EXTREMITIES normal strength, tone, and muscle mass PLAN OF CARE Provide calm/safe environment. Vital signs assessed twice daily. ED BHU Assessment once each 12-hour shift. Collaborate with intake RN daily or as condition indicates. Assure the ED provider has rounded once each shift. Provide and encourage hygiene. Provide redirection as needed. Assess for escalating behavior; address immediately and inform ED provider.  Assess family dynamic and appropriateness for visitation as needed: Yes.   Educate the patient/family about BHU procedures/visitation: Yes.   

## 2017-04-07 NOTE — ED Provider Notes (Signed)
[facility] Emergency Department Provider Note       Time seen: ----------------------------------------- 5:26 PM on 04/07/2017 -----------------------------------------     I have reviewed the triage vital signs and the nursing notes.   HISTORY   Chief Complaint Psychiatric Evaluation    HPI Ryan Long is a 56 y.o. male who presents to the ED for depression and suicidal ideation. Patient was having thoughts of killing himself by jumping in front of traffic. Patient summoned the police. Patient states he's not done anything to harm himself at this point. He denies any recent illness or other complaints.   Past Medical History:  Diagnosis Date  . Active smoker   . Alcohol abuse   . Anxiety   . AR (allergic rhinitis)   . Bipolar 1 disorder (HCC)   . CHF (congestive heart failure) (HCC)   . Chronic systolic heart failure (HCC)   . COPD (chronic obstructive pulmonary disease) (HCC)   . Crack cocaine use   . Depression   . Genital herpes   . HIV (human immunodeficiency virus infection) (HCC)   . HLD (hyperlipidemia)   . Hypertension   . NICM (nonischemic cardiomyopathy) (HCC)    Echocardiogram 06/28/11: EF 30-35%, mild MR, mild LAE;  No CAD by coronary CT angiogram 3/12 at [facility]  . NSVT (nonsustained ventricular tachycardia) (HCC)   . PTSD (post-traumatic stress disorder)     Patient Active Problem List   Diagnosis Date Noted  . Screening examination for venereal disease 04/18/2016  . Encounter for long-term (current) use of medications 04/18/2016  . Major depressive disorder, recurrent (HCC) 10/08/2015  . COPD (chronic obstructive pulmonary disease) (HCC) 10/04/2015  . Abnormal thyroid stimulating hormone (TSH) level 10/04/2015  . Severe episode of recurrent major depressive disorder, without psychotic features (HCC)   . MDD (major depressive disorder), recurrent episode, severe (HCC) 09/23/2015  . HTN (hypertension)  09/10/2015  . MDD (major depressive disorder), recurrent, severe, with psychosis (HCC) 08/07/2015  . PTSD (post-traumatic stress disorder) 08/07/2015  . Cocaine use disorder, severe, dependence (HCC) 08/07/2015  . Alcohol use disorder, severe, dependence (HCC) 08/07/2015  . Tobacco use disorder 08/07/2015  . Suicidal ideation   . Polysubstance (excluding opioids) dependence with physiological dependence (HCC) 12/30/2013  . Substance induced mood disorder (HCC) 11/22/2013  . Cocaine abuse, episodic use 03/20/2012    Class: Acute  . Alcohol dependence (HCC) 03/20/2012    Class: Acute  . Chronic systolic heart failure (HCC)   . Cardiomyopathy, nonischemic (HCC) 08/24/2011  . Human immunodeficiency virus (HIV) disease (HCC) 02/01/2009  . Recurrent HSV (herpes simplex virus) 02/01/2009  . HYPERLIPIDEMIA 02/01/2009  . ALLERGIC RHINITIS 02/01/2009    Past Surgical History:  Procedure Laterality Date  . NO PAST SURGERIES      Allergies Patient has no known allergies.  Social History Social History  Substance Use Topics  . Smoking status: Current Every Day Smoker    Packs/day: 0.75    Years: 30.00    Types: Cigarettes  . Smokeless tobacco: Never Used     Comment: 15 cigarettes daily  . Alcohol use 3.6 oz/week    6 Cans of beer per week     Comment: reports drinks 6+ beers 3 times a month    Review of Systems Constitutional: Negative for fever. Cardiovascular: Negative for chest pain. Respiratory: Negative for shortness of breath. Gastrointestinal: Negative for abdominal pain, vomiting and diarrhea. Genitourinary: Negative for dysuria. Musculoskeletal: Negative for back pain. Skin: Negative for rash.  Neurological: Negative for headaches, focal weakness or numbness. Psychiatric: Positive for suicidal ideation  10-point ROS otherwise negative.  ____________________________________________   PHYSICAL EXAM:  VITAL SIGNS: ED Triage Vitals  Enc Vitals Group     BP  04/07/17 1644 124/87     Pulse Rate 04/07/17 1644 84     Resp 04/07/17 1644 16     Temp 04/07/17 1644 98.2 F (36.8 C)     Temp Source 04/07/17 1644 Oral     SpO2 04/07/17 1644 98 %     Weight 04/07/17 1645 191 lb (86.6 kg)     Height 04/07/17 1645 5\' 9"  (1.753 m)     Head Circumference --      Peak Flow --      Pain Score 04/07/17 1647 0     Pain Loc --      Pain Edu? --      Excl. in GC? --     Constitutional: Alert and oriented. Well appearing and in no distress. Eyes: Conjunctivae are normal. PERRL. Normal extraocular movements. ENT   Head: Normocephalic and atraumatic.   Nose: No congestion/rhinnorhea.   Mouth/Throat: Mucous membranes are moist.   Neck: No stridor. Cardiovascular: Normal rate, regular rhythm. No murmurs, rubs, or gallops. Respiratory: Normal respiratory effort without tachypnea nor retractions. Breath sounds are clear and equal bilaterally. No wheezes/rales/rhonchi. Gastrointestinal: Soft and nontender. Normal bowel sounds Musculoskeletal: Nontender with normal range of motion in extremities. No lower extremity tenderness nor edema. Neurologic:  Normal speech and language. No gross focal neurologic deficits are appreciated.  Skin:  Skin is warm, dry and intact. No rash noted. Psychiatric: Depressed mood and affect ____________________________________________  ED COURSE:  Pertinent labs & imaging results that were available during my care of the patient were reviewed by me and considered in my medical decision making (see chart for details). Patient presents for suicidal ideation, we will assess with labs and imaging as indicated.   Procedures ____________________________________________   LABS (pertinent positives/negatives)  Labs Reviewed  CBC - Abnormal; Notable for the following:       Result Value   WBC 2.7 (*)    Hemoglobin 18.1 (*)    HCT 52.2 (*)    All other components within normal limits  COMPREHENSIVE METABOLIC PANEL   ETHANOL  SALICYLATE LEVEL  ACETAMINOPHEN LEVEL  URINE DRUG SCREEN, QUALITATIVE ([facility] ONLY)   ____________________________________________  FINAL ASSESSMENT AND PLAN  Depression, suicidal ideation  Plan: Patient's labs were dictated above. Patient had presented for suicidal thought. He appears medically stable for psychiatric evaluation. Patient is here voluntarily.   Emily Filbert, MD   Note: This note was generated in part or whole with voice recognition software. Voice recognition is usually quite accurate but there are transcription errors that can and very often do occur. I apologize for any typographical errors that were not detected and corrected.     Emily Filbert, MD 04/07/17 [phone]

## 2017-04-07 NOTE — ED Triage Notes (Signed)
States today began having thoughts killing self by jumping in front of traffic. Summoned police. States has not done anything to harm self at this point.

## 2017-04-07 NOTE — ED Notes (Signed)
ED Provider at bedside. 

## 2017-04-07 NOTE — ED Notes (Signed)
SOC at bedside and active

## 2017-04-08 DIAGNOSIS — F322 Major depressive disorder, single episode, severe without psychotic features: Secondary | ICD-10-CM | POA: Diagnosis not present

## 2017-04-08 NOTE — ED Notes (Signed)
Patient asleep in room. No noted distress or abnormal behavior. Will continue 15 minute checks and observation by security cameras for safety. 

## 2017-04-08 NOTE — ED Notes (Signed)
Patient received meal tray 

## 2017-04-08 NOTE — ED Notes (Signed)
Snack given.

## 2017-04-08 NOTE — ED Notes (Signed)
Patient offered shower but declined. Maintained on 15 minute checks and observation by security camera for safety.
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
## 2017-04-08 NOTE — ED Provider Notes (Signed)
-----------------------------------------   4:44 AM on 04/08/2017 -----------------------------------------   Blood pressure 110/74, pulse 82, temperature 98 F (36.7 C), temperature source Oral, resp. rate 18, height 5\' 9"  (1.753 m), weight 191 lb (86.6 kg), SpO2 99 %.  The patient had no acute events since last update.  Calm and cooperative at this time.  Disposition is pending Psychiatry/Behavioral Medicine team recommendations.     Arnaldo Natal, MD 04/08/17 [phone]

## 2017-04-08 NOTE — ED Notes (Signed)

## 2017-04-08 NOTE — ED Notes (Signed)
Patient awake, alert, and oriented. Somewhat irritable and guarded on interview. Patient reports SI with vague plan to stand in front of traffic. States depressed mood due to "lots of stress." Currently living in group home but does not want to return. Does not know what the hospital can do for him. Patient received breakfast tray.

## 2017-04-08 NOTE — ED Notes (Signed)
Patient resting quietly in room. No noted distress or abnormal behaviors noted. Will continue 15 minute checks and observation by security camera for safety. 

## 2017-04-08 NOTE — ED Notes (Signed)
Maintained on 15 minute checks and observation by security camera for safety. 

## 2017-04-08 NOTE — BH Assessment (Signed)
Referral information for Psychiatric Hospitalization faxed to;    [facility] [phone]),    [place] [phone])   [address] [phone] ex.[phone]),    [facility] [phone]),    [facility] [phone]),    [address] [phone]),    [place] [phone])   [facility] [phone]),    [place] [phone] or [phone]),    [facility] [phone]),    [facility] [phone]).   [place] [phone])   [address]

## 2017-04-09 DIAGNOSIS — F332 Major depressive disorder, recurrent severe without psychotic features: Secondary | ICD-10-CM

## 2017-04-09 DIAGNOSIS — F322 Major depressive disorder, single episode, severe without psychotic features: Secondary | ICD-10-CM | POA: Diagnosis not present

## 2017-04-09 NOTE — ED Provider Notes (Signed)
-----------------------------------------   6:42 AM on 04/09/2017 -----------------------------------------   Blood pressure 124/72, pulse 68, temperature 97.9 F (36.6 C), temperature source Oral, resp. rate 18, height 5\' 9"  (1.753 m), weight 191 lb (86.6 kg), SpO2 100 %.  The patient had no acute events since last update.  Calm and cooperative at this time.  Disposition is pending Psychiatry/Behavioral Medicine team recommendations.     Irean Hong, MD 04/09/17 [phone]

## 2017-04-09 NOTE — ED Notes (Signed)
Breakfast tray set in room.

## 2017-04-09 NOTE — BH Assessment (Signed)
Patient has been accepted to [facility].  Patient assigned to [facility] Accepting physician is Dr. Wendall Stade.  Call report to [phone].  Representative was [employer] .  ER Staff is aware of it (April, ER Sect. & Amy B., Patient's Nurse)     Patient can transport via [facility].

## 2017-04-09 NOTE — ED Notes (Signed)
Pt Vol/ transporting to [facility]

## 2017-04-09 NOTE — ED Notes (Signed)
Pt compliant with medications. Pt resting in bed. Maintained on 15 minute checks and observation by security camera for safety.

## 2017-04-09 NOTE — ED Notes (Signed)
Pt discharged to [facility] to be transported to [facility]. All belongings left with pt.

## 2017-04-09 NOTE — Consult Note (Signed)
[facility] Face-to-Face Psychiatry Consult   Reason for Consult:  Consult for 56-year-old man with a history of depression and substance abuse and medical problems or came to the emergency room over the weekend claiming to be suicidal Referring Physician:  Kinner Patient Identification: Duayne Linares MRN:  [id number] Principal Diagnosis: MDD (major depressive disorder), recurrent episode, severe (HCC) Diagnosis:   Patient Active Problem List   Diagnosis Date Noted  . Screening examination for venereal disease [Z11.3] 04/18/2016  . Encounter for long-term (current) use of medications [Z79.899] 04/18/2016  . Major depressive disorder, recurrent (HCC) [F33.9] 10/08/2015  . COPD (chronic obstructive pulmonary disease) (HCC) [J44.9] 10/04/2015  . Abnormal thyroid stimulating hormone (TSH) level [R94.6] 10/04/2015  . Severe episode of recurrent major depressive disorder, without psychotic features (HCC) [F33.2]   . MDD (major depressive disorder), recurrent episode, severe (HCC) [F33.2] 09/23/2015  . HTN (hypertension) [I10] 09/10/2015  . MDD (major depressive disorder), recurrent, severe, with psychosis (HCC) [F33.3] 08/07/2015  . PTSD (post-traumatic stress disorder) [F43.10] 08/07/2015  . Cocaine use disorder, severe, dependence (HCC) [F14.20] 08/07/2015  . Alcohol use disorder, severe, dependence (HCC) [F10.20] 08/07/2015  . Tobacco use disorder [F17.200] 08/07/2015  . Suicidal ideation [R45.851]   . Polysubstance (excluding opioids) dependence with physiological dependence (HCC) [F19.20] 12/30/2013  . Substance induced mood disorder (HCC) [F19.94] 11/22/2013  . Cocaine abuse, episodic use [F14.10] 03/20/2012    Class: Acute  . Alcohol dependence (HCC) [F10.20] 03/20/2012    Class: Acute  . Chronic systolic heart failure (HCC) [I50.22]   . Cardiomyopathy, nonischemic (HCC) [I42.8] 08/24/2011  . Human immunodeficiency virus (HIV) disease (HCC) [B20] 02/01/2009  . Recurrent HSV (herpes simplex  virus) [B00.9] 02/01/2009  . HYPERLIPIDEMIA [E78.5] 02/01/2009  . ALLERGIC RHINITIS [J30.9] 02/01/2009    Total Time spent with patient: 30 minutes  Subjective:   Ryan Long is a 56 y.o. male patient admitted with "I thought of walking into traffic".  HPI:  This is a follow-up note. Patient was seen by tele-psychiatry over the weekend. This 56-year-old man with a history of substance abuse and mood disorder came to the emergency room claiming that he was having thoughts of walking out into traffic. Apparently he made enough of a scene in public that law enforcement was alerted. It's possible that he may have called them himself. Didn't actually hurt himself. He says his biggest stressor is that the place where he was living is unacceptable. The people there he says are mean to him and calling him terrible names and telling him that he is not wanted. Patient denies having any current hallucinations. Admits that he is poorly compliant with outpatient treatment. Also admits that he continues to use crack cocaine on a frequent basis several times a week and drink alcohol regularly. Denies any current homicidal intent although he said he was very angry at people who were cruel to him.  Substance abuse history: Established problem with cocaine and drinking that clearly exacerbates his mood problem. Drug screen positive for cocaine. Not positive for recent alcohol.  Social history: Patient lives in what sounds like a boarding house. Poorly compliant with outpatient treatment.  Medical history: HIV-positive. He is supposed to be on a cocktail of medication that he is often noncompliant with.  Past Psychiatric History: Patient is had previous psychiatric hospitalizations mostly in [place]. Usually same kind of circumstance. Has been on antidepressant medicine but is currently noncompliant. Does have a history of some similar suicidality and suicidal behavior to what he is presenting with   today. No  clear history of any mania.  Risk to Self: Suicidal Ideation: Yes-Currently Present Suicidal Intent: Yes-Currently Present Is patient at risk for suicide?: Yes Suicidal Plan?: Yes-Currently Present Specify Current Suicidal Plan: Walk into traffic Access to Means: No What has been your use of drugs/alcohol within the last 12 months?: Alcohol  & Cocaine How many times?: 2 Other Self Harm Risks: Active addiction Triggers for Past Attempts: Other personal contacts, Anniversary, Other (Comment) Intentional Self Injurious Behavior: None Risk to Others: Homicidal Ideation: No Thoughts of Harm to Others: No Current Homicidal Intent: No Current Homicidal Plan: No Access to Homicidal Means: No Identified Victim: Reports of none History of harm to others?: No Assessment of Violence: None Noted Violent Behavior Description: Reports of none Does patient have access to weapons?: No Criminal Charges Pending?: No Does patient have a court date: No Prior Inpatient Therapy: Prior Inpatient Therapy: Yes Prior Therapy Dates: 06/2016, 09/2015, 08/2015, 07/2015 (04/2015, 03/2015, 08/2014) Prior Therapy Facilty/Provider(s): [facility], [facility] and other unknown hospital Reason for Treatment: Depression & Substance Use Prior Outpatient Therapy: Prior Outpatient Therapy: No Prior Therapy Dates: Reports of none Prior Therapy Facilty/Provider(s): Reports of none Reason for Treatment: Reports of none Does patient have an ACCT team?: No Does patient have Intensive In-House Services?  : No Does patient have [facility] services? : No Does patient have P4CC services?: No  Past Medical History:  Past Medical History:  Diagnosis Date  . Active smoker   . Alcohol abuse   . Anxiety   . AR (allergic rhinitis)   . Bipolar 1 disorder ([place])   . CHF (congestive heart failure) ([place])   . Chronic systolic heart failure ([place])   . COPD (chronic obstructive pulmonary disease) ([place])   . Crack cocaine use   .  Depression   . Genital herpes   . HIV (human immunodeficiency virus infection) ([place])   . HLD (hyperlipidemia)   . Hypertension   . NICM (nonischemic cardiomyopathy) ([place])    Echocardiogram 06/28/11: EF 30-35%, mild MR, mild LAE;  No CAD by coronary CT angiogram 3/12 at [facility]  . NSVT (nonsustained ventricular tachycardia) ([place])   . PTSD (post-traumatic stress disorder)     Past Surgical History:  Procedure Laterality Date  . NO PAST SURGERIES     Family History:  Family History  Problem Relation Age of Onset  . Hypertension Father   . CAD Father    Family Psychiatric  History: Patient does not know of any family history Social History:  History  Alcohol Use  . 3.6 oz/week  . 6 Cans of beer per week    Comment: reports drinks 6+ beers 3 times a month     History  Drug Use  . Types: Cocaine    Comment: reports uses crack cocaine 3 times a month    Social History   Social History  . Marital status: Single    Spouse name: N/A  . Number of children: N/A  . Years of education: N/A   Social History Main Topics  . Smoking status: Current Every Day Smoker    Packs/day: 0.75    Years: 30.00    Types: Cigarettes  . Smokeless tobacco: Never Used     Comment: 15 cigarettes daily  . Alcohol use 3.6 oz/week    6 Cans of beer per week     Comment: reports drinks 6+ beers 3 times a month  . Drug use: Yes    Types:  Cocaine     Comment: reports uses crack cocaine 3 times a month  . Sexual activity: Yes    Birth control/ protection: Condom     Comment: pt. given condoms   Other Topics Concern  . None   Social History Narrative  . None   Additional Social History:    Allergies:  No Known Allergies  Labs:  Results for orders placed or performed during the hospital encounter of 04/07/17 (from the past 48 hour(s))  Comprehensive metabolic panel     Status: None   Collection Time: 04/07/17  4:47 PM  Result Value Ref Range   Sodium 139 135 - 145  mmol/L   Potassium 3.6 3.5 - 5.1 mmol/L   Chloride 107 101 - 111 mmol/L   CO2 24 22 - 32 mmol/L   Glucose, Bld 98 65 - 99 mg/dL   BUN 10 6 - 20 mg/dL   Creatinine, Ser 1.15 0.61 - 1.24 mg/dL   Calcium 9.1 8.9 - 10.3 mg/dL   Total Protein 7.2 6.5 - 8.1 g/dL   Albumin 4.4 3.5 - 5.0 g/dL   AST 34 15 - 41 U/L   ALT 31 17 - 63 U/L   Alkaline Phosphatase 58 38 - 126 U/L   Total Bilirubin 1.0 0.3 - 1.2 mg/dL   GFR calc non Af Amer >60 >60 mL/min   GFR calc Af Amer >60 >60 mL/min    Comment: (NOTE) The eGFR has been calculated using the CKD EPI equation. This calculation has not been validated in all clinical situations. eGFR's persistently <60 mL/min signify possible Chronic Kidney Disease.    Anion gap 8 5 - 15  Ethanol     Status: None   Collection Time: 04/07/17  4:47 PM  Result Value Ref Range   Alcohol, Ethyl (B) <5 <5 mg/dL    Comment:        LOWEST DETECTABLE LIMIT FOR SERUM ALCOHOL IS 5 mg/dL FOR MEDICAL PURPOSES ONLY   Salicylate level     Status: None   Collection Time: 04/07/17  4:47 PM  Result Value Ref Range   Salicylate Lvl <7.0 2.8 - 30.0 mg/dL  Acetaminophen level     Status: Abnormal   Collection Time: 04/07/17  4:47 PM  Result Value Ref Range   Acetaminophen (Tylenol), Serum <10 (L) 10 - 30 ug/mL    Comment:        THERAPEUTIC CONCENTRATIONS VARY SIGNIFICANTLY. A RANGE OF 10-30 ug/mL MAY BE AN EFFECTIVE CONCENTRATION FOR MANY PATIENTS. HOWEVER, SOME ARE BEST TREATED AT CONCENTRATIONS OUTSIDE THIS RANGE. ACETAMINOPHEN CONCENTRATIONS >150 ug/mL AT 4 HOURS AFTER INGESTION AND >50 ug/mL AT 12 HOURS AFTER INGESTION ARE OFTEN ASSOCIATED WITH TOXIC REACTIONS.   cbc     Status: Abnormal   Collection Time: 04/07/17  4:47 PM  Result Value Ref Range   WBC 2.7 (L) 3.8 - 10.6 K/uL   RBC 5.37 4.40 - 5.90 MIL/uL   Hemoglobin 18.1 (H) 13.0 - 18.0 g/dL   HCT 52.2 (H) 40.0 - 52.0 %   MCV 97.2 80.0 - 100.0 fL   MCH 33.7 26.0 - 34.0 pg   MCHC 34.6 32.0 - 36.0  g/dL   RDW 13.9 11.5 - 14.5 %   Platelets 196 150 - 440 K/uL  Urine Drug Screen, Qualitative     Status: Abnormal   Collection Time: 04/07/17  4:48 PM  Result Value Ref Range   Tricyclic, Ur Screen NONE DETECTED NONE DETECTED   Amphetamines, Ur   Screen NONE DETECTED NONE DETECTED   MDMA (Ecstasy)Ur Screen NONE DETECTED NONE DETECTED   Cocaine Metabolite,Ur [place] POSITIVE (A) NONE DETECTED   Opiate, Ur Screen NONE DETECTED NONE DETECTED   Phencyclidine (PCP) Ur S NONE DETECTED NONE DETECTED   Cannabinoid 50 Ng, Ur [place] NONE DETECTED NONE DETECTED   Barbiturates, Ur Screen NONE DETECTED NONE DETECTED   Benzodiazepine, Ur Scrn NONE DETECTED NONE DETECTED   Methadone Scn, Ur NONE DETECTED NONE DETECTED    Comment: (NOTE) 100  Tricyclics, urine               Cutoff 1000 ng/mL 200  Amphetamines, urine             Cutoff 1000 ng/mL 300  MDMA (Ecstasy), urine           Cutoff 500 ng/mL 400  Cocaine Metabolite, urine       Cutoff 300 ng/mL 500  Opiate, urine                   Cutoff 300 ng/mL 600  Phencyclidine (PCP), urine      Cutoff 25 ng/mL 700  Cannabinoid, urine              Cutoff 50 ng/mL 800  Barbiturates, urine             Cutoff 200 ng/mL 900  Benzodiazepine, urine           Cutoff 200 ng/mL 1000 Methadone, urine                Cutoff 300 ng/mL 1100 1200 The urine drug screen provides only a preliminary, unconfirmed 1300 analytical test result and should not be used for non-medical 1400 purposes. Clinical consideration and professional judgment should 1500 be applied to any positive drug screen result due to possible 1600 interfering substances. A more specific alternate chemical method 1700 must be used in order to obtain a confirmed analytical result.  1800 Gas chromato graphy / mass spectrometry (GC/MS) is the preferred 1900 confirmatory method.     Current Facility-Administered Medications  Medication Dose Route Frequency Provider Last Rate Last Dose  . albuterol (PROVENTIL  HFA;VENTOLIN HFA) 108 (90 Base) MCG/ACT inhaler 2 puff  2 puff Inhalation Q6H PRN Jonathan E Williams, MD      . aspirin chewable tablet 81 mg  81 mg Oral q morning - 10a Jonathan E Williams, MD   81 mg at 04/09/17 0902  . digoxin (LANOXIN) tablet 0.125 mg  0.125 mg Oral Daily Jonathan E Williams, MD   0.125 mg at 04/09/17 0901  . dolutegravir (TIVICAY) tablet 50 mg  50 mg Oral Daily Jonathan E Williams, MD   50 mg at 04/09/17 0902  . emtricitabine-tenofovir AF (DESCOVY) 200-25 MG per tablet 1 tablet  1 tablet Oral Daily Jonathan E Williams, MD   1 tablet at 04/09/17 0902  . furosemide (LASIX) tablet 40 mg  40 mg Oral Daily Jonathan E Williams, MD   40 mg at 04/09/17 0903  . hydrOXYzine (ATARAX/VISTARIL) tablet 25-50 mg  25-50 mg Oral QHS Jonathan E Williams, MD   50 mg at 04/08/17 2152  . losartan (COZAAR) tablet 50 mg  50 mg Oral Daily Jonathan E Williams, MD   50 mg at 04/09/17 0901  . metoprolol succinate (TOPROL-XL) 24 hr tablet 25 mg  25 mg Oral QHS Jonathan E Williams, MD   25 mg at 04/08/17 2152  . multivitamin with minerals tablet 1 tablet  1 tablet   Oral Daily Earleen Newport, MD   1 tablet at 04/09/17 0901  . spironolactone (ALDACTONE) tablet 12.5 mg  12.5 mg Oral Daily Earleen Newport, MD   12.5 mg at 04/09/17 0902  . valACYclovir (VALTREX) tablet 500 mg  500 mg Oral Daily Earleen Newport, MD   500 mg at 04/09/17 0901   Current Outpatient Prescriptions  Medication Sig Dispense Refill  . aspirin 81 MG chewable tablet Chew 1 tablet (81 mg total) by mouth every morning. For heart health    . digoxin (LANOXIN) 0.125 MG tablet Take 1 tablet (0.125 mg total) by mouth daily. For control of heart arrythmia. 30 tablet 0  . dolutegravir (TIVICAY) 50 MG tablet Take 1 tablet (50 mg total) by mouth daily. 30 tablet 11  . emtricitabine-tenofovir AF (DESCOVY) 200-25 MG tablet Take 1 tablet by mouth daily. 30 tablet 11  . furosemide (LASIX) 40 MG tablet Take 1 tablet (40 mg total) by mouth  daily. 30 tablet 1  . hydrOXYzine (ATARAX/VISTARIL) 25 MG tablet Take 25-50 mg by mouth at bedtime.    Marland Kitchen losartan (COZAAR) 50 MG tablet Take 1 tablet (50 mg total) by mouth daily. 30 tablet 3  . metoprolol succinate (TOPROL-XL) 25 MG 24 hr tablet Take 1 tablet (25 mg total) by mouth at bedtime. For high blood pressure 30 tablet 3  . Multiple Vitamins-Minerals (MULTIVITAMIN WITH MINERALS) tablet Take 1 tablet by mouth daily.    Marland Kitchen Phenir-PE-Sod Sal-Caff Cit (COUGH/COLD MEDICINE PO) Take 1 tablet by mouth daily.    Marland Kitchen spironolactone (ALDACTONE) 25 MG tablet Take 12.5 mg by mouth daily.    . valACYclovir (VALTREX) 500 MG tablet Take 1 tablet (500 mg total) by mouth daily. For Herpes. 30 tablet 11  . VENTOLIN HFA 108 (90 Base) MCG/ACT inhaler Inhale 2 puffs into the lungs every 6 (six) hours as needed for wheezing or shortness of breath. 1 Inhaler 0    Musculoskeletal: Strength & Muscle Tone: within normal limits Gait & Station: normal Patient leans: N/A  Psychiatric Specialty Exam: Physical Exam  Nursing note and vitals reviewed. Constitutional: He appears well-developed and well-nourished.  HENT:  Head: Normocephalic and atraumatic.  Eyes: Conjunctivae are normal. Pupils are equal, round, and reactive to light.  Neck: Normal range of motion.  Cardiovascular: Regular rhythm and normal heart sounds.   Respiratory: Effort normal. No respiratory distress.  GI: Soft.  Musculoskeletal: Normal range of motion.  Neurological: He is alert.  Skin: Skin is warm and dry.  Psychiatric: His mood appears anxious. His speech is delayed. He is withdrawn. Thought content is paranoid. He expresses impulsivity. He exhibits a depressed mood. He expresses suicidal ideation. He exhibits abnormal recent memory.    Review of Systems  Constitutional: Negative.   HENT: Negative.   Eyes: Negative.   Respiratory: Negative.   Cardiovascular: Negative.   Gastrointestinal: Negative.   Musculoskeletal: Negative.    Skin: Negative.   Neurological: Negative.   Psychiatric/Behavioral: Positive for depression, substance abuse and suicidal ideas. Negative for hallucinations. The patient is nervous/anxious and has insomnia.     Blood pressure 124/72, pulse 68, temperature 97.9 F (36.6 C), temperature source Oral, resp. rate 18, height 5' 9" (1.753 m), weight 86.6 kg (191 lb), SpO2 100 %.Body mass index is 28.21 kg/m.  General Appearance: Fairly Groomed  Eye Contact:  Minimal  Speech:  Slow  Volume:  Decreased  Mood:  Depressed  Affect:  Blunt  Thought Process:  Goal Directed  Orientation:  Full (Time, Place, and Person)  Thought Content:  Rumination  Suicidal Thoughts:  Yes.  with intent/plan  Homicidal Thoughts:  No  Memory:  Immediate;   Good Recent;   Poor Remote;   Fair  Judgement:  Impaired  Insight:  Shallow  Psychomotor Activity:  Decreased  Concentration:  Concentration: Fair  Recall:  Fair  Fund of Knowledge:  Fair  Language:  Fair  Akathisia:  No  Handed:  Right  AIMS (if indicated):     Assets:  Desire for Improvement Housing  ADL's:  Intact  Cognition:  Impaired,  Mild  Sleep:        Treatment Plan Summary: Daily contact with patient to assess and evaluate symptoms and progress in treatment, Medication management and Plan 56-year-old man who has been here in the emergency room over the weekend. He was seen by tele-psychiatry and recommended for hospital treatment. On reevaluation today the patient continues to endorse suicidality. Has a blunt dysphoric mood. Patient continues to meet criteria for hospital level treatment. Patient will be referred to inpatient facilities. I'm told at this time that a bed is now available for him at [facility] and he is going to be transferred voluntarily. Continue current medicine as ordered.  Disposition: Recommend psychiatric Inpatient admission when medically cleared. Supportive therapy provided about ongoing stressors.  [name] Clapacs,  MD 04/09/2017 12:52 PM 

## 2017-04-09 NOTE — ED Notes (Signed)
Pt informed he would be transferred to [facility]. Pt accepting. Pt compliant with VS. Maintained on 15 minute checks and observation by security camera for safety.

## 2017-04-09 NOTE — ED Notes (Signed)
Pt pleasant and cooperative. Pt endorses SI / depression.  No AVH. No needs or concerns at this time. Maintained on 15 minute checks and observation by security camera for safety.

## 2017-04-11 ENCOUNTER — Telehealth: Payer: Self-pay | Admitting: Licensed Clinical Social Worker

## 2017-04-11 NOTE — Telephone Encounter (Signed)
CSW received call from patient informing CSW that he was admitted to [facility] and to pass on to paramedic. Shortly after, CSW received call from Earnstine Regal social worker at [employer] in [place]. She stated she had a release of information from Hendricks Canty who was admitted on 04/09/17. Alvino Chapel requesting assistance with coordination of care and wanting name and numbers of other providers to coordinate future/upcoming appointments (PCP and ID)for patient. Social worker stated she plans to assist patient with transition to an ALF in the area post discharge and will coordinate transportation to future medical appointments. CSW will forward information to [facility] paramedic and inform of admission. CSW continues to be available as needed for any care coordination. Lasandra Beech, LCSW, CCSW-MCS [phone]

## 2017-04-16 ENCOUNTER — Ambulatory Visit: Payer: Self-pay | Admitting: Infectious Disease

## 2017-04-17 ENCOUNTER — Telehealth: Payer: Self-pay | Admitting: Licensed Clinical Social Worker

## 2017-04-17 NOTE — Telephone Encounter (Signed)
CSW returned call to patient who reports he is being discharged today from the [facility] in [place]. Patient reports he plans to travel to [place], [place] upon discharge as he has been made aware of a transitional housing program. Patient states [place] as he has had some difficulties with housing over the years and would like a new start in [place]. CSW provided supportive intervention and encouraged patient to reconnect with HF clinic should he return to [place] in the future. CSW will  Inform paramedicine program of discharge. Lasandra Beech, LCSW, CCSW-MCS [phone]

## 2017-04-18 ENCOUNTER — Other Ambulatory Visit: Payer: Self-pay | Admitting: Internal Medicine

## 2017-04-23 DIAGNOSIS — F419 Anxiety disorder, unspecified: Secondary | ICD-10-CM | POA: Diagnosis not present

## 2017-04-23 DIAGNOSIS — F29 Unspecified psychosis not due to a substance or known physiological condition: Secondary | ICD-10-CM | POA: Diagnosis not present

## 2017-04-23 DIAGNOSIS — R0789 Other chest pain: Secondary | ICD-10-CM | POA: Diagnosis not present

## 2017-04-23 DIAGNOSIS — F329 Major depressive disorder, single episode, unspecified: Secondary | ICD-10-CM | POA: Diagnosis not present

## 2017-04-23 DIAGNOSIS — I110 Hypertensive heart disease with heart failure: Secondary | ICD-10-CM | POA: Diagnosis not present

## 2017-04-23 DIAGNOSIS — I509 Heart failure, unspecified: Secondary | ICD-10-CM | POA: Diagnosis not present

## 2017-04-23 DIAGNOSIS — F17210 Nicotine dependence, cigarettes, uncomplicated: Secondary | ICD-10-CM | POA: Diagnosis not present

## 2017-04-23 DIAGNOSIS — R079 Chest pain, unspecified: Secondary | ICD-10-CM | POA: Diagnosis not present

## 2017-04-23 DIAGNOSIS — R45851 Suicidal ideations: Secondary | ICD-10-CM | POA: Diagnosis not present

## 2017-04-24 DIAGNOSIS — N6320 Unspecified lump in the left breast, unspecified quadrant: Secondary | ICD-10-CM | POA: Diagnosis not present

## 2017-04-24 DIAGNOSIS — B20 Human immunodeficiency virus [HIV] disease: Secondary | ICD-10-CM | POA: Diagnosis not present

## 2017-04-24 DIAGNOSIS — I10 Essential (primary) hypertension: Secondary | ICD-10-CM | POA: Diagnosis not present

## 2017-04-24 DIAGNOSIS — I509 Heart failure, unspecified: Secondary | ICD-10-CM | POA: Diagnosis not present

## 2017-04-27 DIAGNOSIS — G4489 Other headache syndrome: Secondary | ICD-10-CM | POA: Diagnosis not present

## 2017-04-27 DIAGNOSIS — E869 Volume depletion, unspecified: Secondary | ICD-10-CM | POA: Diagnosis not present

## 2017-04-27 DIAGNOSIS — R51 Headache: Secondary | ICD-10-CM | POA: Diagnosis not present

## 2017-04-27 DIAGNOSIS — R404 Transient alteration of awareness: Secondary | ICD-10-CM | POA: Diagnosis not present

## 2017-04-27 DIAGNOSIS — R55 Syncope and collapse: Secondary | ICD-10-CM | POA: Diagnosis not present

## 2017-04-27 DIAGNOSIS — R42 Dizziness and giddiness: Secondary | ICD-10-CM | POA: Diagnosis not present

## 2017-04-27 DIAGNOSIS — S0990XA Unspecified injury of head, initial encounter: Secondary | ICD-10-CM | POA: Diagnosis not present

## 2017-04-27 DIAGNOSIS — N179 Acute kidney failure, unspecified: Secondary | ICD-10-CM | POA: Diagnosis not present

## 2017-04-27 DIAGNOSIS — I509 Heart failure, unspecified: Secondary | ICD-10-CM | POA: Diagnosis not present

## 2017-04-27 NOTE — Nursing Note (Signed)
Adult Patient History Form-Text       Adult Patient History Entered On:  04/27/2017 16:16 EDT    Performed On:  04/27/2017 16:06 EDT by Rosana Hoes, RN, [place] A               General Info   Patient Identified 2018 :   Identification band, Verbal   Patient Identified :   [place]   Accompanied By 2018 :   None   In Charge of News (ICON) Name :   self   Pregnancy Status :   N/A   In Charge of News (ICON) Code :   [phone]   Has the patient received chemotherapy or biotherapy within the last 48 hours? :   No   In Clinical Trial With Signed Consent for Related Condition :   No signed consent for clinical trial   Is the patient currently (2-3 days) receiving radiation treatment? :   No   DAVIS, RN, MICHELLE A - 04/27/2017 16:06 EDT   Allergies   (As Of: 04/27/2017 16:16:21 EDT)   Allergies (Active)   No Known Allergies  Estimated Onset Date:   Unspecified ; Created By:   Cleophus Molt, RN, Christina N; Reaction Status:   Active ; Category:   Drug ; Substance:   No Known Allergies ; Type:   Allergy ; Updated By:   Verdie Shire; Reviewed Date:   04/27/2017 16:07 EDT        Medication History   Medication List   (As Of: 04/27/2017 16:16:21 EDT)   Normal Order    Sodium Chloride 0.9% intravenous solution 1,000 mL  :   Sodium Chloride 0.9% intravenous solution 1,000 mL ; Status:   Ordered ; Ordered As Mnemonic:   Sodium Chloride 0.9% 1,000 mL ; Simple Display Line:   125 mL/hr, IV ; Ordering Provider:   DAVIDSON-MD,  SCOTT; Catalog Code:   Sodium Chloride 0.9% ; Order Dt/Tm:   04/27/2017 14:38:02          aspirin 81 mg Chew Tab  :   aspirin 81 mg Chew Tab ; Status:   Ordered ; Ordered As Mnemonic:   aspirin ; Simple Display Line:   81 mg, 1 tabs, Oral, Daily ; Ordering Provider:   DAVIDSON-MD,  Nicki Reaper; Catalog Code:   aspirin ; Order Dt/Tm:   04/27/2017 14:36:12          aspirin  :   aspirin ; Status:   Voided ; Ordered As Mnemonic:   aspirin 81 mg oral tablet ; Simple Display Line:   81 mg, 1 tabs, Oral, Daily ; Ordering Provider:    DAVIDSON-MD,  Nicki Reaper; Catalog Code:   aspirin ; Order Dt/Tm:   04/27/2017 14:30:39          citalopram 20 mg Tab  :   citalopram 20 mg Tab ; Status:   Ordered ; Ordered As Mnemonic:   CeleXA ; Simple Display Line:   20 mg, 1 tabs, Oral, Daily ; Ordering Provider:   DAVIDSON-MD,  Nicki Reaper; Catalog Code:   citalopram ; Order Dt/Tm:   04/27/2017 14:30:42 ; Comment:   ---  At home, patient was taking medication with the following details:  Comments: SOUND ALIKE / LOOK ALIKE - VERIFY DRUG    ---          digoxin 125 mcg (0.125 mg) Tab  :   digoxin 125 mcg (0.125 mg) Tab ; Status:   Ordered ; Ordered As Mnemonic:   Lanoxin ;  Simple Display Line:   125 mcg, 1 tabs, Oral, Daily ; Ordering Provider:   DAVIDSON-MD,  Nicki Reaper; Catalog Code:   digoxin ; Order Dt/Tm:   04/27/2017 14:39:08          dolutegravir 50 mg Tab  :   dolutegravir 50 mg Tab ; Status:   Ordered ; Ordered As Mnemonic:   Tivicay ; Simple Display Line:   50 mg, 1 tabs, Oral, Daily ; Ordering Provider:   DAVIDSON-MD,  Nicki Reaper; Catalog Code:   dolutegravir ; Order Dt/Tm:   04/27/2017 14:30:47          nicotine  :   nicotine ; Status:   Voided ; Ordered As Mnemonic:   nicotine 7 mg/24 hr transdermal film, extended release ; Simple Display Line:   1 patches, Topical, Daily ; Ordering Provider:   DAVIDSON-MD,  Nicki Reaper; Catalog Code:   nicotine ; Order Dt/Tm:   04/27/2017 14:31:01          nicotine 7 mg/24 hr TD Film, ER  :   nicotine 7 mg/24 hr TD Film, ER ; Status:   Ordered ; Ordered As Mnemonic:   nicotine 7 mg/24 hr transdermal film, extended release ; Simple Display Line:   7 mg/24hr, 1 patches, Topical, Daily ; Ordering Provider:   DAVIDSON-MD,  Nicki Reaper; Catalog Code:   nicotine ; Order Dt/Tm:   04/27/2017 14:36:48          OLANZapine 5 mg Tab  :   OLANZapine 5 mg Tab ; Status:   Ordered ; Ordered As Mnemonic:   ZyPREXA ; Simple Display Line:   10 mg, 2 tabs, Oral, Daily ; Ordering Provider:   DAVIDSON-MD,  Nicki Reaper; Catalog Code:   OLANZapine ; Order Dt/Tm:   04/27/2017  14:31:05          valACYclovir 500 mg Tab  :   valACYclovir 500 mg Tab ; Status:   Ordered ; Ordered As Mnemonic:   Valtrex ; Simple Display Line:   500 mg, 1 tabs, Oral, q12hr ; Ordering Provider:   DAVIDSON-MD,  Nicki Reaper; Catalog Code:   valACYclovir ; Order Dt/Tm:   04/27/2017 14:31:13          New Consult - Check Clinical Pharmacy MPTL  :   New Consult - Check Clinical Pharmacy MPTL ; Status:   Ordered ; Ordered As Mnemonic:   [place] ; Simple Display Line:   1 EA, N/A, On Call ; Ordering Provider:   SYSTEM,  SYSTEM; Catalog Code:   [place] MP ; Order Dt/Tm:   04/27/2017 14:40:11 ; Comment:   New consult for [place]. Pharmacist should review in the [place].      As soon as appropriate, pharmacy should discontinue or void this order to remove it from the [facility].          acetaminophen 325 mg Tab  :   acetaminophen 325 mg Tab ; Status:   Ordered ; Ordered As Mnemonic:   acetaminophen ; Simple Display Line:   650 mg, 2 tabs, Oral, q4hr, PRN: mild pain (1-3) or temp > 100.5 F ; Ordering Provider:   DAVIDSON-MD,  Nicki Reaper; Catalog Code:   acetaminophen ; Order Dt/Tm:   04/27/2017 14:38:02 ; Comment:   temp > 100.5 F  MAX DAILY DOSE OF ACETAMINOPHEN = 3000 MG          aluminum hydroxide/magnesium hydroxide/simethicone 200 mg-200 mg-20  mg/5 mL  :   aluminum hydroxide/magnesium hydroxide/simethicone 200 mg-200 mg-20 mg/5 mL ; Status:   Ordered ; Ordered As Mnemonic:   aluminum hydroxide/magnesium hydroxide/simethicone 200 mg-200 mg-20 mg/5 mL oral suspension ; Simple Display Line:   30 mL, Oral, q3hr, PRN: indigestion ; Ordering Provider:   DAVIDSON-MD,  SCOTT; Catalog Code:   Al hydroxide/Mg hydroxide/simethicone ; Order Dt/Tm:   04/27/2017 14:38:03          ondansetron 2 mg/mL Inj Soln 2 mL  :   ondansetron 2 mg/mL Inj Soln 2 mL ; Status:   Ordered ; Ordered As Mnemonic:   ondansetron ; Simple Display Line:   4 mg, 2 mL, IV  Push, q6hr, PRN: nausea/vomiting ; Ordering Provider:   DAVIDSON-MD,  Zenobia.Makos; Catalog Code:   ondansetron ; Order Dt/Tm:   04/27/2017 14:38:03          A Patient Specific Medication  :   A Patient Specific Medication ; Status:   Ordered ; Ordered As Mnemonic:   A Patient Specific Medication ; Simple Display Line:   1 EA, Kit-Combo, q84mn, PRN: other (see comment) ; Ordering Provider:   DAVIDSON-MD,  SNicki Reaper Catalog Code:   A Patient Specific Medication ; Order Dt/Tm:   04/27/2017 13:05:08 ; Comment:   to access the patient specific medication drawer          A Patient Specific Refrigerated Medication  :   A Patient Specific Refrigerated Medication ; Status:   Ordered ; Ordered As Mnemonic:   A Patient Specific Refrigerated Medication ; Simple Display Line:   1 EA, Kit-Combo, q549m, PRN: other (see comment) ; Ordering Provider:   DAVIDSON-MD,  SCNicki ReaperCatalog Code:   A Patient Specific Refrigerated Medicati ; Order Dt/Tm:   04/27/2017 13:05:08 ; Comment:   to access the patient specific Refrigerated medications          lidocaine 1% PF Inj Soln 2 mL  :   lidocaine 1% PF Inj Soln 2 mL ; Status:   Ordered ; Ordered As Mnemonic:   lidocaine 1% preservative-free injectable solution ; Simple Display Line:   0.5 mL, ID, q5m48m PRN: other (see comment) ; Ordering Provider:   DAVIDSON-MD,  SCONicki Reaperatalog Code:   lidocaine ; Order Dt/Tm:   04/27/2017 13:05:09 ; Comment:   to access lidocaine 1%  2 mL vial for IV start and Life Port access          Respiratory MDI Treatment  :   Respiratory MDI Treatment ; Status:   Ordered ; Ordered As Mnemonic:   Respiratory MDI Treatment ; Simple Display Line:   1 EA, Kit-Combo, q5mi57mPRN: other (see comment) ; Ordering Provider:   DAVIDSON-MD,  SCOTNicki Reapertalog Code:   Respiratory MDI Treatment ; Order Dt/Tm:   04/27/2017 13:05:08          sodium chloride 0.9% Inj Soln 10 mL syringe  :   sodium chloride 0.9% Inj Soln 10 mL syringe ; Status:   Ordered ; Ordered As Mnemonic:   sodium chloride 0.9%  flush syringe range dose ; Simple Display Line:   30 mL, IV Push, q5min53mRN: other (see comment) ; Ordering Provider:   DAVIDSON-MD,  SCOTTNicki Reaperalog Code:   sodium chloride flush ; Order Dt/Tm:   04/27/2017 13:05:09 ; Comment:   for access to sodium chloride 0.9% syringe for INT flush if needed          sodium chloride 0.9% Inj Soln 10 mL vial  :  sodium chloride 0.9% Inj Soln 10 mL vial ; Status:   Ordered ; Ordered As Mnemonic:   sodium chloride 0.9% vial for reconstitution range dose ; Simple Display Line:   30 mL, IV Push, q67mn, PRN: other (see comment) ; Ordering Provider:   DAVIDSON-MD,  SNicki Reaper Catalog Code:   sodium chloride flush ; Order Dt/Tm:   04/27/2017 13:05:09 ; Comment:   for access to sodium chloride 0.9% vial when needed as a diluent for reconstitutable medications          sterile water Inj Soln 10 mL  :   sterile water Inj Soln 10 mL ; Status:   Ordered ; Ordered As Mnemonic:   sterile water for reconstitution ; Simple Display Line:   10 mL, N/A, q544m, PRN: other (see comment) ; Ordering Provider:   DAVIDSON-MD,  SCNicki ReaperCatalog Code:   sterile water for reconstitution ; Order Dt/Tm:   04/27/2017 13:05:09 ; Comment:   to access sterile water when needed as a diluent for reconstitutable medications. Not for IV use.          Sodium Chloride 0.9% intravenous solution Bolus  :   Sodium Chloride 0.9% intravenous solution Bolus ; Status:   Completed ; Ordered As Mnemonic:   Sodium Chloride 0.9% bolus ; Simple Display Line:   1,000 mL, IV Piggyback, Once ; Ordering Provider:   COOK-MD,  DAEllen HenriCatalog Code:   Sodium Chloride 0.9% ; Order Dt/Tm:   04/27/2017 12:08:33          tetanus/diphth/pertuss (Tdap) adult/adol 5 units-2.5 units-18.5 mcg/0.5 mL IM Susp 0.5 mL  :   tetanus/diphth/pertuss (Tdap) adult/adol 5 units-2.5 units-18.5 mcg/0.5 mL IM Susp 0.5 mL ; Status:   Completed ; Ordered As Mnemonic:   Boostrix (Tdap) ; Simple Display Line:   0.5 mL, IM, Once ; Ordering Provider:   COOK-MD,  DAEllen HenriCatalog Code:   tetanus/diphth/pertuss (Tdap) adult/adol ; Order Dt/Tm:   04/27/2017 12:04:36          Sodium Chloride 0.9% intravenous solution Bolus  :   Sodium Chloride 0.9% intravenous solution Bolus ; Status:   Completed ; Ordered As Mnemonic:   Sodium Chloride 0.9% bolus ; Simple Display Line:   1,000 mL, 2000 mL/hr, IV Piggyback, Once ; Ordering Provider:   COOK-MD,  DAEllen HenriCatalog Code:   Sodium Chloride 0.9% ; Order Dt/Tm:   04/27/2017 10:26:17            Home Meds    sulfamethoxazole-trimethoprim  :   sulfamethoxazole-trimethoprim ; Status:   Documented ; Ordered As Mnemonic:   Bactrim ; Simple Display Line:   2 tabs, Oral, BID, 0 Refill(s) ; Catalog Code:   sulfamethoxazole-trimethoprim ; Order Dt/Tm:   04/27/2017 10:38:04          valACYclovir  :   valACYclovir ; Status:   Documented ; Ordered As Mnemonic:   Valtrex 500 mg oral tablet ; Simple Display Line:   500 mg, 1 tabs, Oral, q12hr, 0 Refill(s) ; Catalog Code:   valACYclovir ; Order Dt/Tm:   04/27/2017 10:38:16          OLANZapine  :   OLANZapine ; Status:   Documented ; Ordered As Mnemonic:   ZyPREXA 10 mg oral tablet ; Simple Display Line:   10 mg, 1 tabs, Oral, Daily, 30 tabs, 0 Refill(s) ; Catalog Code:   OLANZapine ; Order Dt/Tm:   04/27/2017 10:37:17  spironolactone  :   spironolactone ; Status:   Documented ; Ordered As Mnemonic:   Aldactone 25 mg oral tablet ; Simple Display Line:   25 mg, 1 tabs, Oral, Daily, 30 tabs, 0 Refill(s) ; Catalog Code:   spironolactone ; Order Dt/Tm:   04/27/2017 10:37:37          metoprolol  :   metoprolol ; Status:   Documented ; Ordered As Mnemonic:   metoprolol succinate 25 mg oral tablet, extended release ; Simple Display Line:   25 mg, 1 tabs, Oral, Daily, 0 Refill(s) ; Catalog Code:   metoprolol ; Order Dt/Tm:   04/27/2017 10:36:05          nicotine  :   nicotine ; Status:   Documented ; Ordered As Mnemonic:   nicotine 7 mg/24 hr transdermal film, extended release ; Simple Display Line:   1  patches, Topical, Daily, 30 patches, 0 Refill(s) ; Catalog Code:   nicotine ; Order Dt/Tm:   04/27/2017 10:36:45          dolutegravir  :   dolutegravir ; Status:   Documented ; Ordered As Mnemonic:   Tivicay 50 mg oral tablet ; Simple Display Line:   50 mg, 1 tabs, Oral, Daily, 30 tabs, 0 Refill(s) ; Catalog Code:   dolutegravir ; Order Dt/Tm:   04/27/2017 10:35:02          emtricitabine-tenofovir  :   emtricitabine-tenofovir ; Status:   Documented ; Ordered As Mnemonic:   Descovy ; Simple Display Line:   tabs, Oral, Daily, 0 Refill(s) ; Catalog Code:   emtricitabine-tenofovir ; Order Dt/Tm:   04/27/2017 10:35:19          furosemide  :   furosemide ; Status:   Documented ; Ordered As Mnemonic:   Lasix 40 mg oral tablet ; Simple Display Line:   40 mg, 1 tabs, Oral, Daily, 0 Refill(s) ; Catalog Code:   furosemide ; Order Dt/Tm:   04/27/2017 10:35:37          losartan  :   losartan ; Status:   Documented ; Ordered As Mnemonic:   Cozaar 50 mg oral tablet ; Simple Display Line:   50 mg, 1 tabs, Oral, Daily, 30 tabs, 0 Refill(s) ; Catalog Code:   losartan ; Order Dt/Tm:   04/27/2017 10:35:51          aspirin  :   aspirin ; Status:   Documented ; Ordered As Mnemonic:   aspirin 81 mg oral tablet ; Simple Display Line:   81 mg, 1 tabs, Oral, Daily, 0 Refill(s) ; Catalog Code:   aspirin ; Order Dt/Tm:   04/27/2017 10:34:32          citalopram  :   citalopram ; Status:   Documented ; Ordered As Mnemonic:   CeleXA 20 mg oral tablet ; Simple Display Line:   20 mg, 1 tabs, Oral, Daily, 30 tabs, 0 Refill(s) ; Catalog Code:   citalopram ; Order Dt/Tm:   04/27/2017 10:34:44 ; Comment:   SOUND ALIKE / LOOK ALIKE - VERIFY DRUG          digoxin  :   digoxin ; Status:   Documented ; Ordered As Mnemonic:   Lanoxin 125 mcg (0.125 mg) oral tablet ; Simple Display Line:   125 mcg, 1 tabs, Oral, Daily, 90 tabs, 0 Refill(s) ; Catalog Code:   digoxin ; Order Dt/Tm:   04/27/2017 10:34:04  Problem History   (As Of: 04/27/2017 16:16:21 EDT)    Problems(Active)    CHF (congestive heart failure) (SNOMED CT  :45409811 )  Name of Problem:   CHF (congestive heart failure) ; Recorder:   English, RN, Abbott Pao; Confirmation:   Confirmed ; Classification:   Patient Stated ; Code:   [id number] ; Contributor System:   PowerChart ; Last Updated:   04/27/2017 10:32 EDT ; Life Cycle Date:   04/27/2017 ; Life Cycle Status:   Active ; Vocabulary:   SNOMED CT        HIV (human immunodeficiency virus infection) (SNOMED CT  :621308657 )  Name of Problem:   HIV (human immunodeficiency virus infection) ; Recorder:   English, RN, Abbott Pao; Confirmation:   Confirmed ; Classification:   Patient Stated ; Code:   [id number] ; Contributor System:   PowerChart ; Last Updated:   04/27/2017 10:32 EDT ; Life Cycle Date:   04/27/2017 ; Life Cycle Status:   Active ; Vocabulary:   SNOMED CT        HTN (hypertension) (SNOMED CT  :8413244010 )  Name of Problem:   HTN (hypertension) ; Recorder:   English, RN, Abbott Pao; Confirmation:   Confirmed ; Classification:   Patient Stated ; Code:   [id number] ; Contributor System:   Conservation officer, nature ; Last Updated:   04/27/2017 10:32 EDT ; Life Cycle Date:   04/27/2017 ; Life Cycle Status:   Active ; Vocabulary:   SNOMED CT        Schizophrenia (SNOMED CT  :34742595 )  Name of Problem:   Schizophrenia ; Recorder:   English, RN, Abbott Pao; Confirmation:   Confirmed ; Classification:   Patient Stated ; Code:   [id number] ; Contributor System:   PowerChart ; Last Updated:   04/27/2017 10:39 EDT ; Life Cycle Date:   04/27/2017 ; Life Cycle Status:   Active ; Vocabulary:   SNOMED CT          Diagnoses(Active)    Acute kidney injury  Date:   04/27/2017 ; Diagnosis Type:   Discharge ; Confirmation:   Confirmed ; Clinical Dx:   Acute kidney injury ; Classification:   Medical ; Clinical Service:   Non-Specified ; Code:   ICD-10-CM ; Probability:   0 ; Diagnosis Code:   N17.9      Fall  Date:   04/27/2017 ; Diagnosis Type:   Reason For Visit ; Confirmation:   Complaint  of ; Clinical Dx:   Fall ; Classification:   Medical ; Clinical Service:   Non-Specified ; Code:   PNED ; Probability:   0 ; Diagnosis Code:   [id number]      Fall  Date:   04/27/2017 ; Diagnosis Type:   Discharge ; Confirmation:   Confirmed ; Clinical Dx:   Fall ; Classification:   Medical ; Clinical Service:   Non-Specified ; Code:   ICD-10-CM ; Probability:   0 ; Diagnosis Code:   R42.XXXA      Hypotension  Date:   04/27/2017 ; Diagnosis Type:   Discharge ; Confirmation:   Confirmed ; Clinical Dx:   Hypotension ; Classification:   Medical ; Clinical Service:   Non-Specified ; Code:   ICD-10-CM ; Probability:   0 ; Diagnosis Code:   I95.9      Scalp contusion  Date:   04/27/2017 ; Diagnosis Type:   Discharge ; Confirmation:   Confirmed ; Clinical Dx:   Scalp contusion ;  Classification:   Medical ; Clinical Service:   Non-Specified ; Code:   ICD-10-CM ; Probability:   0 ; Diagnosis Code:   S00.03XA        Procedure History        -    Procedure History   (As Of: 04/27/2017 16:16:21 EDT)     Immunizations   Influenza Vaccine Status :   Non-influenza season (before Oct 1st and after Mar 31st)   Last Tetanus :   Less than 5 years   DAVIS, RN, Sharyn Lull A - 04/27/2017 16:06 EDT   ID Risk Screen   Chills :   No   Cough (Any Duration) :   No   Fever :   No   Hemoptysis (Blood in Sputum) :   No   Night Sweats :   No   Weight Loss Greater Than 10 Pounds :   No   Hx of TB Now or at Any Time In the Past (Even if on Meds) :   No   Foreign-Born :   No   Homeless or In Shelter :   No   Incarcerated Within Last 2 Years :   No   Intravenous Drug User :   No   Male Homosexual :   No   New TST/IGRA Results Pos(Within 2 yrs),Hx Recent TB Exposure :   No   DAVIS, RN, MICHELLE A - 04/27/2017 16:06 EDT   3 or more loose/watery stools :   No   MRSA/VRE Screening :   Admitted from a [facility] or another healthcare facility   Patient Recent Travel History :   No recent travel   Family Member Travel History :   No  recent travel   [place], RN, [place] A - 04/27/2017 16:06 EDT   Infectious Disease Risk Factor Grid   Chills :   No   Fever :   No   Fatigue :   No   Headache :   No   Runny or Stuffy Nose :   No   Sore Throat :   No   Difficulty Breathing :   No   Shortness of Breath :   No   New or Worsening Cough :   No   Wheezing :   No   Vomiting :   No   Diarrhea :   No   Abdominal (Stomach Pain) :   No   Muscle Pain :   No   Weakness/Numbness :   No   Recent Exposure to Communicable Disease :   No   Illness With Generalized Rash :   No   Abnormal Bleeding :   No   Unexplained Hemorrhage (Bleeding or Bruising) :   No   Arthralgia :   No   Conjunctivitis :   No   DAVIS, RN, MICHELLE A - 04/27/2017 16:06 EDT   Bloodless Medicine   Will Patient Accept Blood Transfusion and/or Blood Products :   Yes   DAVIS, RN, MICHELLE A - 04/27/2017 16:06 EDT   Nutrition   Nutritional Risk Factors :   None   DAVIS, RN, MICHELLE A - 04/27/2017 16:06 EDT   Functional   Sensory Deficits :   None   ADLs Prior to Admission :   Independent   DAVIS, RN, MICHELLE A - 04/27/2017 16:06 EDT   Social History   Social History   (As Of: 04/27/2017 16:16:21 EDT)     Spiritual  Faith/Denomination :   Darrick Meigs   Do you have a concern that you would like to address with a Chaplain? :   No   Do you have any religious/spiritual/cultural beliefs that could impact the way your care is provided? :   No   DAVIS, RN, MICHELLE A - 04/27/2017 16:06 EDT   Harm Screen   Injuries/Abuse/Neglect in Household :   Denies   Feels Unsafe at Home :   No   Agency(s)/Others notified :   No   Recent Life Events :   Relocation   Family Behavioral Health History :   Mental illness   Previous Mental Illness Diagnosis :   Psychotic disorder   Symptoms of Psychosis Present :   None   Suicidal Behavior :   None   Self Harming Behavior :   None   Suicidal Ideation :   None   DAVIS, RN, MICHELLE A - 04/27/2017 16:06 EDT   Advance Directive   Advance Directive :   No   Patient Wishes to Receive  Further Information on Advance Directives :   No   DAVIS, RN, MICHELLE A - 04/27/2017 16:06 EDT   Education   Written Language :   Cleophus Molt   Primary Language :   [place], RN, MICHELLE A - 04/27/2017 16:06 EDT   Caregiver/Advocate Language   Patient :   Market researcher, Verbal explanation   DAVIS, RN, MICHELLE A - 04/27/2017 16:06 EDT   Barriers to Learning :   None evident   Teaching Method :   Explanation, Printed materials, Teach-back   DAVIS, RN, MICHELLE A - 04/27/2017 16:06 EDT   DCP GENERIC CODE   Unit/Room Orientation :   Verbalizes understanding   Environmental Safety :   Verbalizes understanding   Hand Washing :   Verbalizes understanding   Infection Prevention :   Verbalizes understanding   DVT Prophylaxis :   Verbalizes understanding   Isolation Precaution :   Verbalizes understanding   DAVIS, RN, MICHELLE A - 04/27/2017 16:06 EDT   DC Needs   Living Situation :   Homeless   Home Equipment Rehab :   None   Anticipated Discharge Needs :   None   DAVIS, RN, MICHELLE A - 04/27/2017 16:06 EDT   Valuables and Belongings   Does Patient Have Valuables and Belongings :   Yes   DAVIS, RN, MICHELLE A - 04/27/2017 16:06 EDT   DCP GENERIC CODE   Secured Location :   Clothes   DAVIS, RN, MICHELLE A - 04/27/2017 16:06 EDT   Valuables and Belongings Comment :   locked in unit storage   Patient Search Completed :   [place], RN, MICHELLE A - 04/27/2017 16:06 EDT   Admission Complete   Admission Complete :   Yes   DAVIS, RN, MICHELLE A - 04/27/2017 16:06 EDT

## 2017-04-28 DIAGNOSIS — R45851 Suicidal ideations: Secondary | ICD-10-CM | POA: Diagnosis not present

## 2017-04-28 DIAGNOSIS — N63 Unspecified lump in unspecified breast: Secondary | ICD-10-CM | POA: Diagnosis not present

## 2017-04-28 DIAGNOSIS — I959 Hypotension, unspecified: Secondary | ICD-10-CM | POA: Diagnosis not present

## 2017-04-28 DIAGNOSIS — F321 Major depressive disorder, single episode, moderate: Secondary | ICD-10-CM | POA: Diagnosis not present

## 2017-04-28 DIAGNOSIS — I509 Heart failure, unspecified: Secondary | ICD-10-CM | POA: Diagnosis not present

## 2017-04-28 DIAGNOSIS — I5041 Acute combined systolic (congestive) and diastolic (congestive) heart failure: Secondary | ICD-10-CM | POA: Diagnosis not present

## 2017-04-28 DIAGNOSIS — B20 Human immunodeficiency virus [HIV] disease: Secondary | ICD-10-CM | POA: Diagnosis not present

## 2017-04-28 DIAGNOSIS — R928 Other abnormal and inconclusive findings on diagnostic imaging of breast: Secondary | ICD-10-CM | POA: Diagnosis not present

## 2017-04-28 NOTE — Case Communication (Signed)
CM Discharge Planning Assessment - Text       CM Discharge Planning Assessment Entered On:  04/28/2017 13:55 EDT    Performed On:  04/28/2017 13:50 EDT by Georgana Curio Dawn               Home Environment   Living Environment :   No Living Environment Information Available     Affect/Behavior :   Appropriate, Calm, Cooperative   Lives With :   Alone   Lives In :   Homeless   Living Situation :   Home independently, Homeless   Georgana Curio [place] - 04/28/2017 13:50 EDT   Discharge Needs I   Previously Documented Discharge Needs :   DISCHARGE PLAN/NEEDS:  EQUIPMENT/TREATMENT NEEDS:       Previously Documented Benefits Information :   No discharge data available.     Discharge To :   Psychiatric Unit   CM Progress Note :   04/28/17 - cdm - SW introduced self to pt. He is receiving inpatient tx at [facility]. He fell while there and was brought to this facility to be medically cleared. Pt reports he was transferred to [facility] from a [facility]. He was living in [place] and states he had an inpatient psych hospitalization in [place] w/in the last year. He denies knowing what his MH dx is but admits to depression. Pt reports no family/social support and intends to stay in the area when he completes inpatient tx. SW spoke with Zollie Scale at [facility] and she advised to re-send his referral so they have updated med recs as well as new commitment papers. Marcelino Duster, RN, has informed SW that pt is medically cleared by MD and ready for d/c.       Georgana Curio [facility] - 04/28/2017 13:50 EDT   Discharge Needs II   Home Equipment Rehab :   None   Discharge Planning Time Spent :   30 minutes   Georgana Curio [facility] - 04/28/2017 13:50 EDT

## 2017-04-28 NOTE — Progress Notes (Signed)
Chaplaincy Note - Text       Chaplaincy Note Entered On:  04/28/2017 13:12 EDT    Performed On:  04/28/2017 13:11 EDT by Renee Harder               Chaplaincy Consult   Faith/Denomination :   Ephriam Knuckles   Importance of Faith :   Moderately important   Religious Spiritual Concerns :   Anxiety, Fear   Additional Information, Chaplaincy :   Pt indicated he was depressed. He has fear regarding whether he will get well. Prayed with pt.   Renee Harder - 04/28/2017 13:11 EDT

## 2017-04-29 DIAGNOSIS — E861 Hypovolemia: Secondary | ICD-10-CM | POA: Diagnosis not present

## 2017-04-29 DIAGNOSIS — R55 Syncope and collapse: Secondary | ICD-10-CM | POA: Diagnosis not present

## 2017-04-29 DIAGNOSIS — N179 Acute kidney failure, unspecified: Secondary | ICD-10-CM | POA: Diagnosis not present

## 2017-04-29 DIAGNOSIS — I959 Hypotension, unspecified: Secondary | ICD-10-CM | POA: Diagnosis not present

## 2017-04-29 NOTE — Case Communication (Signed)
CM Discharge Planning Assessment - Text       CM Discharge Planning Ongoing Assessment Entered On:  04/29/2017 10:27 EDT    Performed On:  04/29/2017 10:25 EDT by Georgana CurioMalone,  Cristina Dawn               Discharge Needs I   Previously Documented Discharge Needs :   DISCHARGE PLAN/NEEDS:  Discharge To, Anticipated: Psychiatric Unit - Georgana CurioMalone,  [facility] - 04/28/17 13:50:00  EQUIPMENT/TREATMENT NEEDS:       Previously Documented Benefits Information :   No discharge data available.     Discharge To :   Psychiatric Unit   CM Progress Note :   04/28/17 - cdm - SW introduced self to pt. He is [facility]. He fell while there and was brought to this facility to be medically cleared. Pt reports he [facility]. He was living in [place] and states he had an inpatient psych hospitalization in [place] w/in the last year. He denies knowing what his MH dx is but admits to depression. Pt reports no family/social support and intends to stay in the area when he completes inpatient tx. SW spoke with Zollie Scalelivia at [facility] and she advised to re-send his referral so they have updated med recs as well as new commitment papers. Marcelino DusterMichelle, RN, has informed SW that pt is medically cleared by MD and ready for d/c.      04/29/17 - [facility] go back [facility]. Accepting MD Dr. Morton PetersBo. Unit 3. BMU staff advised.      Georgana CurioMalone,  Cristina [facility] - 04/29/2017 10:25 EDT

## 2017-04-29 NOTE — Nursing Note (Signed)
Nursing Discharge Summary - Text       Nursing Discharge Summary Entered On:  04/29/2017 13:43 EDT    Performed On:  04/29/2017 13:41 EDT by DAVIS, RN, MICHELLE A               DC Information   Discharge To, Anticipated :   Psychiatric Unit   Devices/Equipment :   None   Professional Skilled Services, Anticipated :   No Needs   Transportation :   Other: sheriff   Accompanied By :   None   Mode of Discharge :   Wheelchair   DAVIS, RN, MICHELLE A - 04/29/2017 13:41 EDT   Education   Responsible Learner(s) :   Living Situation: Home independently, Homeless        Performed by: Eugenio HoesMalone,  Cristina Dawn - 04/28/2017 13:50  Discharge To: Psychiatric Unit        Performed by: Georgana CurioMalone,  Cristina Dawn - 04/29/2017 10:25     Barriers To Learning :   None evident   Teaching Method :   Explanation, Printed materials, Teach-back   DAVIS, RN, MICHELLE A - 04/29/2017 13:41 EDT   Post-Hospital Education Adult Grid   Activity Expectations :   Bristol[employer]ers SquibbVerbalizes understanding   Community Resources :   [employer]ristol-[employer]ers SquibbVerbalizes understanding   Diagnostic Results :   Verbalizes understanding   Disease Process :   TEFL teacherVerbalizes understanding   Importance of Follow-Up Visits :   Verbalizes understanding   Plan of Care :   Verbalizes understanding   When to Call Health Care Provider :   [facility]   DAVIS, RN, MICHELLE A - 04/29/2017 13:41 EDT   Health Maintenance Education Adult Grid   Immunizations :   Verbalizes understanding   DAVIS, RN, Marcelino DusterMICHELLE A - 04/29/2017 13:41 EDT   Medication Education Adult Grid   Drug to Drug Interactions :   Verbalizes understanding   Drug to Food Interactions :   TEFL teacherVerbalizes understanding   Med Dosage, Route, Scheduling :   TEFL teacherVerbalizes understanding   Med Generic/Brand Name, Purpose, Action :   Verbalizes understanding   Med Preadministration Procedures :   [employer] SquibbVerbalizes understanding   Med Teacher, musicpecial Administration, Storage :   [employer]ers SquibbVerbalizes understanding   Medication Precautions :   Insurance claims handlerVerbalizes understanding   Safe [employer] Dis[employer] :    Verbalizes understanding   DAVIS, RN, Marcelino DusterMICHELLE A - 04/29/2017 13:41 EDT   Safety Education Adult Grid   Safety, Fall :   Verbalizes understanding   DAVIS, RN, Marcelino DusterMICHELLE A - 04/29/2017 13:41 EDT   Education Referral Made To :   Behavioral Health Practitioner   Time Spent Educating Patient :   20 minutes   DAVIS, RN, MICHELLE A - 04/29/2017 13:41 EDT

## 2017-04-29 NOTE — Discharge Summary (Signed)
Inpatient Patient Summary               [facility]  [address]  [place], [place] [id number]  [phone]  Patient Discharge Instructions     Name: Jerry [name], Jerry [name]  Current Date: 04/29/2017 11:45:48  DOB: February 14, 1961 MRN: [id number] FIN: [id number]  Patient Address: [address] [facility] [id number]  Patient Phone: [phone]  Primary Care Provider:  Name: Malachy MoanSANDIFER-MD,  HONORA M  Phone: [phone]   Immunizations Provided:        Immunization(s) Given This Visit   Name Date   tetanus/diphth/pertuss (Tdap) adult/adol 04/27/17 12:09:00           Discharge Diagnosis: Acute kidney injury; Fall; Hypotension; Scalp contusion  Discharged To: TO, ANTICIPATED%>Psychiatric Unit  Home Treatments: TREATMENTS, ANTICIPATED%>  Devices/Equipment: EQUIPMENT REHAB%>None  Post Hospital Services: HOSPITAL SERVICES%>  Professional Skilled Services: SKILLED SERVICES%>  Special Services and Community Resources:               SERV AND COMM RES, ANTICIPATED%>  Mode of Discharge Transportation: TRANSPORTATION%>  Discharge Orders         Discharge Patient 04/29/17 11:39:00 EDT         Comment:      Medications   During the course of your visit, your medication list was updated with the most current information. The details of those changes are reflected below:         Medications That Were Updated - Follow Current Instructions  Other Medications  Current OLANZapine (ZyPREXA 2.5 mg oral tablet) 1 Tabs Oral (given by mouth) every 6 hours as needed psychosis.  Last Dose:____________________    Medications that have not changed  Other Medications  aspirin (aspirin 81 mg oral tablet) 1 Tabs Oral (given by mouth) every day.  Last Dose:____________________  citalopram (CeleXA 20 mg oral tablet) 1 Tabs Oral (given by mouth) every day., SOUND ALIKE / LOOK ALIKE - VERIFY DRUG  Last Dose:____________________  digoxin (Lanoxin 125 mcg (0.125 mg) oral tablet) 1 Tabs Oral (given by mouth) every day.  Last  Dose:____________________  dolutegravir (Tivicay 50 mg oral tablet) 1 Tabs Oral (given by mouth) every day.  Last Dose:____________________  emtricitabine-tenofovir (Descovy) Oral (given by mouth) every day.  Last Dose:____________________  furosemide (Lasix 40 mg oral tablet) 1 Tabs Oral (given by mouth) every day.  Patient Instructions: Restart Wednesday May 16th  Last Dose:____________________  losartan (Cozaar 50 mg oral tablet) 1 Tabs Oral (given by mouth) every day.  Last Dose:____________________  metoprolol (metoprolol succinate 25 mg oral tablet, extended release) 1 Tabs Oral (given by mouth) every day.  Last Dose:____________________  nicotine (nicotine 7 mg/24 hr transdermal film, extended release) 1 Patches Topical (on the skin) every day.  Last Dose:____________________  spironolactone (Aldactone 25 mg oral tablet) 1 Tabs Oral (given by mouth) every day.  Patient Instructions: Restart Wednesday May 16th  Last Dose:____________________  valACYclovir (Valtrex 500 mg oral tablet) 1 Tabs Oral (given by mouth) every 12 hours.  Last Dose:____________________  No Longer Take the Following Medications  sulfamethoxazole-trimethoprim (Bactrim) 2 Tabs Oral (given by mouth) 2 times a day.  Stop Taking Reason: Physician Request         [facility] would like to thank you for allowing us to assist you with your healthcare needs. The following includes patient education materials and information regarding your injury/illness.     Jerry [name], Jerry [name] has been given the following list of follow-up instructions, prescriptions, and patient education materials:  Follow-up Instructions:             It is important to always keep an active list of medications available so that you can share with other providers and manage your medications appropriately. As an additional courtesy, we are also providing you with your final active medications list that you can keep with you.           aspirin (aspirin 81 mg oral tablet) 1  Tabs Oral (given by mouth) every day.  citalopram (CeleXA 20 mg oral tablet) 1 Tabs Oral (given by mouth) every day., SOUND ALIKE / LOOK ALIKE - VERIFY DRUG  digoxin (Lanoxin 125 mcg (0.125 mg) oral tablet) 1 Tabs Oral (given by mouth) every day.  dolutegravir (Tivicay 50 mg oral tablet) 1 Tabs Oral (given by mouth) every day.  emtricitabine-tenofovir (Descovy) Oral (given by mouth) every day.  furosemide (Lasix 40 mg oral tablet) 1 Tabs Oral (given by mouth) every day.  losartan (Cozaar 50 mg oral tablet) 1 Tabs Oral (given by mouth) every day.  metoprolol (metoprolol succinate 25 mg oral tablet, extended release) 1 Tabs Oral (given by mouth) every day.  nicotine (nicotine 7 mg/24 hr transdermal film, extended release) 1 Patches Topical (on the skin) every day.  OLANZapine (ZyPREXA 2.5 mg oral tablet) 1 Tabs Oral (given by mouth) every 6 hours as needed psychosis.  spironolactone (Aldactone 25 mg oral tablet) 1 Tabs Oral (given by mouth) every day.  valACYclovir (Valtrex 500 mg oral tablet) 1 Tabs Oral (given by mouth) every 12 hours.      Take only the medications listed above. Contact your doctor prior to taking any medications not on this list.        Discharge instructions, if any, will display below     Instructions for Diet: INSTRUCTIONS FOR DIET%>   Instructions for Supplements: SUPPLEMENT INSTRUCTIONS%>   Instructions for Activity: INSTRUCTIONS FOR ACTIVITY%>   Instructions for Wound Care: INSTRUCTIONS FOR WOUND CARE%>     Medication leaflets, if any, will display below     Patient education materials, if any, will display below           IS IT A STROKE? Act FAST and Check for these signs:    FACE                         Does the face look uneven?    ARM                         Does one arm drift down?    SPEECH                    Does their speech sound strange?    TIME                         Call 9-1-1 at any sign of  stroke  ---------------------------------------------------------------------------------------------------------------------------  Heart Attack Signs  Chest discomfort: Most heart attacks involve discomfort in the center of the chest and lasts more than a few minutes, or goes away and comes back. It can feel like uncomfortable pressure, squeezing, fullness or pain.  Discomfort in upper body: Symptoms can include pain or discomfort in one or both arms, back, neck, jaw or stomach.  Shortness of breath: With or without discomfort.  Other signs: Breaking out in a cold sweat, nausea, or lightheaded.  Remember, MINUTES DO MATTER.  If you experience any of these heart attack warning signs, call 9-1-1 to get immediate medical attention!     ---------------------------------------------------------------------------------------------------------------------------  [employer] allows you to manage your health, view your test results, and retrieve your discharge documents from your hospital stay securely and conveniently from your computer.  To begin the enrollment process, visit [email or web address] Click on "Sign up now" under [facility].

## 2017-04-29 NOTE — Discharge Summary (Signed)
Inpatient Clinical Summary             [facility]  Post-Acute Care Transfer Instructions  PERSON INFORMATION   Name: Jerry [name], Jerry [name]  MRN: [id number]    FIN#: [id number]   PHYSICIANS  Admitting Physician: DAVIDSON-MD,  Lorin Picket  Attending Physician: Julious Oka   PCP: Malachy Moan  Discharge Diagnosis:  Acute kidney injury; Fall; Hypotension; Scalp contusion  Comment:       PATIENT EDUCATION INFORMATION  Instructions:               Medication Leaflets:               Follow-up:                                   MEDICATION LIST  Medication Reconciliation at Discharge:         Medications That Were Updated - Follow Current Instructions  Other Medications  Current OLANZapine (ZyPREXA 2.5 mg oral tablet) 1 Tabs Oral (given by mouth) every 6 hours as needed psychosis.  Last Dose:____________________    Medications that have not changed  Other Medications  aspirin (aspirin 81 mg oral tablet) 1 Tabs Oral (given by mouth) every day.  Last Dose:____________________  citalopram (CeleXA 20 mg oral tablet) 1 Tabs Oral (given by mouth) every day., SOUND ALIKE / LOOK ALIKE - VERIFY DRUG  Last Dose:____________________  digoxin (Lanoxin 125 mcg (0.125 mg) oral tablet) 1 Tabs Oral (given by mouth) every day.  Last Dose:____________________  dolutegravir (Tivicay 50 mg oral tablet) 1 Tabs Oral (given by mouth) every day.  Last Dose:____________________  emtricitabine-tenofovir (Descovy) Oral (given by mouth) every day.  Last Dose:____________________  furosemide (Lasix 40 mg oral tablet) 1 Tabs Oral (given by mouth) every day.  Patient Instructions: Restart Wednesday May 16th  Last Dose:____________________  losartan (Cozaar 50 mg oral tablet) 1 Tabs Oral (given by mouth) every day.  Last Dose:____________________  metoprolol (metoprolol succinate 25 mg oral tablet, extended release) 1 Tabs Oral (given by mouth) every day.  Last Dose:____________________  nicotine (nicotine 7 mg/24 hr transdermal film,  extended release) 1 Patches Topical (on the skin) every day.  Last Dose:____________________  spironolactone (Aldactone 25 mg oral tablet) 1 Tabs Oral (given by mouth) every day.  Patient Instructions: Restart Wednesday May 16th  Last Dose:____________________  valACYclovir (Valtrex 500 mg oral tablet) 1 Tabs Oral (given by mouth) every 12 hours.  Last Dose:____________________  No Longer Take the Following Medications  sulfamethoxazole-trimethoprim (Bactrim) 2 Tabs Oral (given by mouth) 2 times a day.  Stop Taking Reason: Physician Request         Patient's Final Home Medication List Upon Discharge:          aspirin (aspirin 81 mg oral tablet) 1 Tabs Oral (given by mouth) every day.  citalopram (CeleXA 20 mg oral tablet) 1 Tabs Oral (given by mouth) every day., SOUND ALIKE / LOOK ALIKE - VERIFY DRUG  digoxin (Lanoxin 125 mcg (0.125 mg) oral tablet) 1 Tabs Oral (given by mouth) every day.  dolutegravir (Tivicay 50 mg oral tablet) 1 Tabs Oral (given by mouth) every day.  emtricitabine-tenofovir (Descovy) Oral (given by mouth) every day.  furosemide (Lasix 40 mg oral tablet) 1 Tabs Oral (given by mouth) every day.  losartan (Cozaar 50 mg oral tablet) 1 Tabs Oral (given by mouth) every day.  metoprolol (metoprolol succinate 25 mg oral tablet,  extended release) 1 Tabs Oral (given by mouth) every day.  nicotine (nicotine 7 mg/24 hr transdermal film, extended release) 1 Patches Topical (on the skin) every day.  OLANZapine (ZyPREXA 2.5 mg oral tablet) 1 Tabs Oral (given by mouth) every 6 hours as needed psychosis.  spironolactone (Aldactone 25 mg oral tablet) 1 Tabs Oral (given by mouth) every day.  valACYclovir (Valtrex 500 mg oral tablet) 1 Tabs Oral (given by mouth) every 12 hours.         Comment:       ORDERS         Order Name Order Details   Discharge Patient 04/29/17 11:39:00 EDT

## 2017-05-01 ENCOUNTER — Telehealth (HOSPITAL_COMMUNITY): Payer: Self-pay | Admitting: Surgery

## 2017-05-01 NOTE — Telephone Encounter (Signed)
Patient will be discharge from the HF Paramedicine Program at this time.  Per patient he will be relocating to [place].

## 2017-05-02 ENCOUNTER — Other Ambulatory Visit: Payer: Self-pay | Admitting: Internal Medicine

## 2017-05-03 DIAGNOSIS — F17200 Nicotine dependence, unspecified, uncomplicated: Secondary | ICD-10-CM | POA: Diagnosis not present

## 2017-05-03 DIAGNOSIS — G44309 Post-traumatic headache, unspecified, not intractable: Secondary | ICD-10-CM | POA: Diagnosis not present

## 2017-05-03 DIAGNOSIS — F39 Unspecified mood [affective] disorder: Secondary | ICD-10-CM | POA: Diagnosis not present

## 2017-05-03 DIAGNOSIS — I509 Heart failure, unspecified: Secondary | ICD-10-CM | POA: Diagnosis not present

## 2017-05-03 DIAGNOSIS — R42 Dizziness and giddiness: Secondary | ICD-10-CM | POA: Diagnosis not present

## 2017-05-03 DIAGNOSIS — R296 Repeated falls: Secondary | ICD-10-CM | POA: Diagnosis not present

## 2017-05-03 DIAGNOSIS — M47812 Spondylosis without myelopathy or radiculopathy, cervical region: Secondary | ICD-10-CM | POA: Diagnosis not present

## 2017-05-03 DIAGNOSIS — I110 Hypertensive heart disease with heart failure: Secondary | ICD-10-CM | POA: Diagnosis not present

## 2017-05-03 DIAGNOSIS — H538 Other visual disturbances: Secondary | ICD-10-CM | POA: Diagnosis not present

## 2017-05-03 DIAGNOSIS — S199XXA Unspecified injury of neck, initial encounter: Secondary | ICD-10-CM | POA: Diagnosis not present

## 2017-05-03 DIAGNOSIS — F0781 Postconcussional syndrome: Secondary | ICD-10-CM | POA: Diagnosis not present

## 2017-05-03 DIAGNOSIS — S0990XA Unspecified injury of head, initial encounter: Secondary | ICD-10-CM | POA: Diagnosis not present

## 2017-05-07 ENCOUNTER — Other Ambulatory Visit: Payer: Self-pay | Admitting: Internal Medicine

## 2017-05-08 ENCOUNTER — Other Ambulatory Visit: Payer: Self-pay | Admitting: *Deleted

## 2017-05-08 DIAGNOSIS — B009 Herpesviral infection, unspecified: Secondary | ICD-10-CM

## 2017-05-08 NOTE — Telephone Encounter (Signed)
Patient requesting refills of tivicay/descovy. He has not been in care at [facility], but at [facility] for last year.  Patient has moved to [place], wants [place] to refill medication. He states he has 1 day left. Please advise. Andree Coss, RN

## 2017-05-08 NOTE — Telephone Encounter (Signed)
I cannot find consistent notes on him at [facility] either. I would be OK giving him one month with one refill but he needs to get bback in care with SOMEONE!

## 2017-05-09 ENCOUNTER — Other Ambulatory Visit: Payer: Self-pay | Admitting: *Deleted

## 2017-05-09 DIAGNOSIS — B20 Human immunodeficiency virus [HIV] disease: Secondary | ICD-10-CM

## 2017-05-09 MED ORDER — VALACYCLOVIR HCL 500 MG PO TABS: 500.0000 mg | ORAL_TABLET | Freq: Every day | ORAL | 11 refills | Status: DC

## 2017-05-09 MED ORDER — DOLUTEGRAVIR SODIUM 50 MG PO TABS: 50.0000 mg | ORAL_TABLET | Freq: Every day | ORAL | 0 refills | Status: DC

## 2017-05-09 MED ORDER — VALACYCLOVIR HCL 500 MG PO TABS: 500.0000 mg | ORAL_TABLET | Freq: Every day | ORAL | 1 refills | Status: DC

## 2017-05-09 MED ORDER — EMTRICITABINE-TENOFOVIR AF 200-25 MG PO TABS: 1.0000 | ORAL_TABLET | Freq: Every day | ORAL | 0 refills | Status: DC

## 2017-05-09 NOTE — Telephone Encounter (Signed)
Patient shared that it may take more than a month to see an ID MD.  He has to see a Primary Care MD in [place] first.  Then, be referred to an ID MD.

## 2017-05-09 NOTE — Telephone Encounter (Signed)
Sent 30 day supply

## 2017-05-09 NOTE — Addendum Note (Signed)
Addended by: Jennet Maduro D on: 05/09/2017 09:27 AM   Modules accepted: Orders

## 2017-05-09 NOTE — Telephone Encounter (Signed)
Per Dr. Daiva Eves, Patient may have one additional refill due to trying to find a new provider in [place].

## 2017-05-09 NOTE — Telephone Encounter (Signed)
Thanks so much. 

## 2017-05-13 DIAGNOSIS — I5022 Chronic systolic (congestive) heart failure: Secondary | ICD-10-CM | POA: Insufficient documentation

## 2017-05-13 DIAGNOSIS — A6000 Herpesviral infection of urogenital system, unspecified: Secondary | ICD-10-CM | POA: Insufficient documentation

## 2017-05-15 DIAGNOSIS — H109 Unspecified conjunctivitis: Secondary | ICD-10-CM | POA: Diagnosis not present

## 2017-05-17 ENCOUNTER — Other Ambulatory Visit: Payer: Self-pay | Admitting: Physician Assistant

## 2017-05-17 DIAGNOSIS — I5022 Chronic systolic (congestive) heart failure: Secondary | ICD-10-CM

## 2017-05-22 ENCOUNTER — Ambulatory Visit (HOSPITAL_COMMUNITY): Admission: RE | Admit: 2017-05-22 | Payer: Medicare Other | Source: Ambulatory Visit

## 2017-05-22 ENCOUNTER — Encounter (HOSPITAL_COMMUNITY): Payer: Self-pay | Admitting: Internal Medicine

## 2017-05-27 DIAGNOSIS — E876 Hypokalemia: Secondary | ICD-10-CM | POA: Diagnosis not present

## 2017-05-27 DIAGNOSIS — R45851 Suicidal ideations: Secondary | ICD-10-CM | POA: Diagnosis not present

## 2017-05-28 DIAGNOSIS — F323 Major depressive disorder, single episode, severe with psychotic features: Secondary | ICD-10-CM | POA: Diagnosis not present

## 2017-05-28 DIAGNOSIS — F419 Anxiety disorder, unspecified: Secondary | ICD-10-CM | POA: Diagnosis not present

## 2017-05-30 DIAGNOSIS — I255 Ischemic cardiomyopathy: Secondary | ICD-10-CM | POA: Diagnosis not present

## 2017-05-30 DIAGNOSIS — I440 Atrioventricular block, first degree: Secondary | ICD-10-CM | POA: Diagnosis not present

## 2017-05-30 DIAGNOSIS — F332 Major depressive disorder, recurrent severe without psychotic features: Secondary | ICD-10-CM | POA: Diagnosis not present

## 2017-05-30 DIAGNOSIS — I509 Heart failure, unspecified: Secondary | ICD-10-CM | POA: Diagnosis not present

## 2017-05-31 DIAGNOSIS — I340 Nonrheumatic mitral (valve) insufficiency: Secondary | ICD-10-CM | POA: Diagnosis not present

## 2017-05-31 DIAGNOSIS — I255 Ischemic cardiomyopathy: Secondary | ICD-10-CM | POA: Diagnosis not present

## 2017-06-01 DIAGNOSIS — I429 Cardiomyopathy, unspecified: Secondary | ICD-10-CM | POA: Diagnosis not present

## 2017-06-02 DIAGNOSIS — D72819 Decreased white blood cell count, unspecified: Secondary | ICD-10-CM | POA: Diagnosis not present

## 2017-06-05 DIAGNOSIS — I429 Cardiomyopathy, unspecified: Secondary | ICD-10-CM | POA: Diagnosis not present

## 2017-06-11 ENCOUNTER — Ambulatory Visit: Payer: Self-pay | Admitting: Family Medicine

## 2017-06-11 DIAGNOSIS — Z1159 Encounter for screening for other viral diseases: Secondary | ICD-10-CM | POA: Diagnosis not present

## 2017-06-11 DIAGNOSIS — R300 Dysuria: Secondary | ICD-10-CM | POA: Diagnosis not present

## 2017-06-11 DIAGNOSIS — Z113 Encounter for screening for infections with a predominantly sexual mode of transmission: Secondary | ICD-10-CM | POA: Diagnosis not present

## 2017-06-11 DIAGNOSIS — R369 Urethral discharge, unspecified: Secondary | ICD-10-CM | POA: Diagnosis not present

## 2017-06-12 DIAGNOSIS — F918 Other conduct disorders: Secondary | ICD-10-CM | POA: Diagnosis not present

## 2017-06-12 DIAGNOSIS — J209 Acute bronchitis, unspecified: Secondary | ICD-10-CM | POA: Diagnosis not present

## 2017-06-12 DIAGNOSIS — F17210 Nicotine dependence, cigarettes, uncomplicated: Secondary | ICD-10-CM | POA: Diagnosis present

## 2017-06-12 DIAGNOSIS — I110 Hypertensive heart disease with heart failure: Secondary | ICD-10-CM | POA: Diagnosis not present

## 2017-06-12 DIAGNOSIS — F332 Major depressive disorder, recurrent severe without psychotic features: Secondary | ICD-10-CM | POA: Diagnosis not present

## 2017-06-12 DIAGNOSIS — Z590 Homelessness: Secondary | ICD-10-CM | POA: Diagnosis not present

## 2017-06-12 DIAGNOSIS — J45909 Unspecified asthma, uncomplicated: Secondary | ICD-10-CM | POA: Diagnosis present

## 2017-06-12 DIAGNOSIS — Z21 Asymptomatic human immunodeficiency virus [HIV] infection status: Secondary | ICD-10-CM | POA: Diagnosis not present

## 2017-06-12 DIAGNOSIS — I10 Essential (primary) hypertension: Secondary | ICD-10-CM | POA: Diagnosis not present

## 2017-06-12 DIAGNOSIS — R45851 Suicidal ideations: Secondary | ICD-10-CM | POA: Diagnosis not present

## 2017-06-12 DIAGNOSIS — R55 Syncope and collapse: Secondary | ICD-10-CM | POA: Diagnosis not present

## 2017-06-12 DIAGNOSIS — I509 Heart failure, unspecified: Secondary | ICD-10-CM | POA: Diagnosis not present

## 2017-06-19 ENCOUNTER — Telehealth: Payer: Self-pay | Admitting: Licensed Clinical Social Worker

## 2017-06-19 NOTE — Telephone Encounter (Signed)
Patient left message for CSW to inform he was moving back to the area. CSW attempted to return call and message left for patient to contact CSW. Lasandra Beech, LCSW, CCSW-MCS [phone]

## 2017-06-23 DIAGNOSIS — F29 Unspecified psychosis not due to a substance or known physiological condition: Secondary | ICD-10-CM | POA: Diagnosis not present

## 2017-06-24 DIAGNOSIS — G4700 Insomnia, unspecified: Secondary | ICD-10-CM | POA: Diagnosis not present

## 2017-06-24 DIAGNOSIS — R45851 Suicidal ideations: Secondary | ICD-10-CM | POA: Diagnosis not present

## 2017-06-24 DIAGNOSIS — F918 Other conduct disorders: Secondary | ICD-10-CM | POA: Diagnosis not present

## 2017-06-24 DIAGNOSIS — Z560 Unemployment, unspecified: Secondary | ICD-10-CM | POA: Diagnosis not present

## 2017-06-24 DIAGNOSIS — Z21 Asymptomatic human immunodeficiency virus [HIV] infection status: Secondary | ICD-10-CM | POA: Diagnosis not present

## 2017-06-24 DIAGNOSIS — I10 Essential (primary) hypertension: Secondary | ICD-10-CM | POA: Diagnosis not present

## 2017-06-24 DIAGNOSIS — F329 Major depressive disorder, single episode, unspecified: Secondary | ICD-10-CM | POA: Diagnosis not present

## 2017-06-24 DIAGNOSIS — F39 Unspecified mood [affective] disorder: Secondary | ICD-10-CM | POA: Diagnosis not present

## 2017-06-24 DIAGNOSIS — Z590 Homelessness: Secondary | ICD-10-CM | POA: Diagnosis not present

## 2017-06-24 DIAGNOSIS — I509 Heart failure, unspecified: Secondary | ICD-10-CM | POA: Diagnosis not present

## 2017-06-24 DIAGNOSIS — R300 Dysuria: Secondary | ICD-10-CM | POA: Diagnosis not present

## 2017-07-02 NOTE — Progress Notes (Signed)
Advanced Heart Failure Clinic Note   Patient ID: Ryan Long, male   DOB: 1961/08/08, 56 y.o.   MRN: [id number] ID: Dr Daiva Eves PCP: Dr Roselle Locus Bayerl is a 56 y.o. male with a history of HIV, hypertension, major depressive disorder, chronic systolic heart failure (nonischemic cardiomyopathy), and polysubstance abuse.  Coronary CT angiogram in 3/12 demonstrated no CAD.  Cardiolite in 2015 showed no ischemia or infarction.  NICM was not felt to be related to HIV given his good CD4 counts.    Today he returns for HF follow up. Admitted in [place] with tachycardia and hypertension. Overall feeling better. Denies SOB/PND/Orthopnea. Does not weigh because he lives at a shelter. Smoking 1/2 PPD. Has not used cocaine in a few months. Taking all medications. Living at a shelter in [facility].   ECHO 06/2013 EF 35-40% ECHO 9/16 with EF 30-35%, diffuse hypokinesis, grade I diastolic dysfunction,   Past Medical History:  Diagnosis Date  . Active smoker   . Alcohol abuse   . Anxiety   . AR (allergic rhinitis)   . Bipolar 1 disorder (HCC)   . CHF (congestive heart failure) (HCC)   . Chronic systolic heart failure (HCC)   . COPD (chronic obstructive pulmonary disease) (HCC)   . Crack cocaine use   . Depression   . Genital herpes   . HIV (human immunodeficiency virus infection) (HCC)   . HLD (hyperlipidemia)   . Hypertension   . NICM (nonischemic cardiomyopathy) (HCC)    Echocardiogram 06/28/11: EF 30-35%, mild MR, mild LAE;  No CAD by coronary CT angiogram 3/12 at [facility]  . NSVT (nonsustained ventricular tachycardia) (HCC)   . PTSD (post-traumatic stress disorder)     Current Outpatient Prescriptions  Medication Sig Dispense Refill  . aspirin 81 MG chewable tablet Chew 1 tablet (81 mg total) by mouth every morning. For heart health    . digoxin (LANOXIN) 0.125 MG tablet Take 1 tablet (0.125 mg total) by mouth daily. For control of heart arrythmia. 30 tablet 0    . dolutegravir (TIVICAY) 50 MG tablet Take 1 tablet (50 mg total) by mouth daily. 30 tablet 0  . emtricitabine-tenofovir AF (DESCOVY) 200-25 MG tablet Take 1 tablet by mouth daily. Last refill until seen at [facility] 30 tablet 0  . furosemide (LASIX) 40 MG tablet Take 1 tablet (40 mg total) by mouth daily. 30 tablet 1  . hydrOXYzine (ATARAX/VISTARIL) 25 MG tablet Take 25-50 mg by mouth at bedtime.    Marland Kitchen losartan (COZAAR) 50 MG tablet Take 1 tablet (50 mg total) by mouth daily. 30 tablet 3  . metoprolol succinate (TOPROL-XL) 25 MG 24 hr tablet Take 1 tablet (25 mg total) by mouth at bedtime. For high blood pressure 30 tablet 3  . Multiple Vitamins-Minerals (MULTIVITAMIN WITH MINERALS) tablet Take 1 tablet by mouth daily.    Marland Kitchen Phenir-PE-Sod Sal-Caff Cit (COUGH/COLD MEDICINE PO) Take 1 tablet by mouth daily.    Marland Kitchen spironolactone (ALDACTONE) 25 MG tablet Take 12.5 mg by mouth daily.    . valACYclovir (VALTREX) 500 MG tablet Take 1 tablet (500 mg total) by mouth daily. For Herpes. 30 tablet 1  . VENTOLIN HFA 108 (90 Base) MCG/ACT inhaler Inhale 2 puffs into the lungs every 6 (six) hours as needed for wheezing or shortness of breath. 1 Inhaler 0   No current facility-administered medications for this encounter.     Allergies: No Known Allergies   Review of systems complete and found  to be negative unless listed in HPI.     Vital Signs: BP 110/74   Pulse 98   Wt 204 lb 6.4 oz (92.7 kg)   SpO2 100%   BMI 30.18 kg/m   Vitals:   07/03/17 0930  BP: 110/74  Pulse: 98  SpO2: 100%  Weight: 204 lb 6.4 oz (92.7 kg)    Filed Weights   07/03/17 0930  Weight: 204 lb 6.4 oz (92.7 kg)    PHYSICAL EXAM: General:  Well appearing. No resp difficulty HEENT: normal Neck: supple. no JVD. Carotids 2+ bilat; no bruits. No lymphadenopathy or thryomegaly appreciated. Cor: PMI nondisplaced. Regular rate & rhythm. No rubs, gallops or murmurs. Lungs: clear Abdomen: soft, nontender, nondistended. No  hepatosplenomegaly. No bruits or masses. Good bowel sounds. Extremities: no cyanosis, clubbing, rash, edema Neuro: alert & orientedx3, cranial nerves grossly intact. moves all 4 extremities w/o difficulty. Affect pleasant   EKG: Sinus Tach 111 bpm   ASSESSMENT AND PLAN:  1. Chronic Systolic Heart Failure: Nonischemic cardiomyopathy.  Echo (9/16) with EF 30-35%, diffuse hypokinesis.  - NYHA class II. - Volume status stable. Continue lasix 40 mg daily.  - Continue toprol xl 25 mg daily.  - Continue spiro 12.5 mg daily - Continue digoxin 0.125 mg daily. Level stable 09/25/2016 2. HIV:  - He has follow up with Dr Daiva Eves.   3. Tobacco use disorder:   - Counseled on smoking cessation. Encouraged to stop.    4. Former Cocaine- has not used in a few months.   Referred to HF SW today assist with housing.     Check BMET today. Follow up in 3 months. Set up repeat ECHO at next visit. Encourage to continue medications.      [name], NP  07/03/2017

## 2017-07-03 ENCOUNTER — Ambulatory Visit (HOSPITAL_COMMUNITY)
Admission: RE | Admit: 2017-07-03 | Discharge: 2017-07-03 | Disposition: A | Discharge status: Home / Self Care | Payer: Medicare Other | Source: Ambulatory Visit | Attending: Cardiology | Admitting: Cardiology

## 2017-07-03 ENCOUNTER — Encounter (HOSPITAL_COMMUNITY): Payer: Self-pay

## 2017-07-03 VITALS — BP 110/74 | HR 98 | Wt 204.4 lb

## 2017-07-03 DIAGNOSIS — I5022 Chronic systolic (congestive) heart failure: Secondary | ICD-10-CM | POA: Insufficient documentation

## 2017-07-03 DIAGNOSIS — J449 Chronic obstructive pulmonary disease, unspecified: Secondary | ICD-10-CM | POA: Insufficient documentation

## 2017-07-03 DIAGNOSIS — I429 Cardiomyopathy, unspecified: Secondary | ICD-10-CM | POA: Diagnosis not present

## 2017-07-03 DIAGNOSIS — F4310 Post-traumatic stress disorder, unspecified: Secondary | ICD-10-CM | POA: Insufficient documentation

## 2017-07-03 DIAGNOSIS — F1411 Cocaine abuse, in remission: Secondary | ICD-10-CM | POA: Diagnosis not present

## 2017-07-03 DIAGNOSIS — Z7982 Long term (current) use of aspirin: Secondary | ICD-10-CM | POA: Diagnosis not present

## 2017-07-03 DIAGNOSIS — E785 Hyperlipidemia, unspecified: Secondary | ICD-10-CM | POA: Diagnosis not present

## 2017-07-03 DIAGNOSIS — I110 Hypertensive heart disease with heart failure: Secondary | ICD-10-CM | POA: Diagnosis not present

## 2017-07-03 DIAGNOSIS — B20 Human immunodeficiency virus [HIV] disease: Secondary | ICD-10-CM | POA: Insufficient documentation

## 2017-07-03 DIAGNOSIS — I159 Secondary hypertension, unspecified: Secondary | ICD-10-CM

## 2017-07-03 DIAGNOSIS — F17200 Nicotine dependence, unspecified, uncomplicated: Secondary | ICD-10-CM | POA: Diagnosis not present

## 2017-07-03 DIAGNOSIS — F17210 Nicotine dependence, cigarettes, uncomplicated: Secondary | ICD-10-CM | POA: Insufficient documentation

## 2017-07-03 LAB — BASIC METABOLIC PANEL
Anion gap: 7 (ref 5–15)
BUN: 11 mg/dL (ref 6–20)
CHLORIDE: 108 mmol/L (ref 101–111)
CO2: 23 mmol/L (ref 22–32)
CREATININE: 1.36 mg/dL — AB (ref 0.61–1.24)
Calcium: 8.9 mg/dL (ref 8.9–10.3)
GFR calc Af Amer: >60 mL/min (ref >60)
GFR calc non Af Amer: 57 mL/min — ABNORMAL LOW (ref >60)
GLUCOSE: 102 mg/dL — AB (ref 65–99)
POTASSIUM: 3.7 mmol/L (ref 3.5–5.1)
Sodium: 138 mmol/L (ref 135–145)

## 2017-07-03 MED ORDER — FUROSEMIDE 40 MG PO TABS: 40.0000 mg | ORAL_TABLET | Freq: Every day | ORAL | 1 refills | Status: DC

## 2017-07-03 MED ORDER — LOSARTAN POTASSIUM 50 MG PO TABS: 50.0000 mg | ORAL_TABLET | Freq: Every day | ORAL | 3 refills | Status: DC

## 2017-07-03 MED ORDER — SPIRONOLACTONE 25 MG PO TABS: 12.5000 mg | ORAL_TABLET | Freq: Every day | ORAL | 3 refills | Status: DC

## 2017-07-03 MED ORDER — DIGOXIN 125 MCG PO TABS: 0.1250 mg | ORAL_TABLET | Freq: Every day | ORAL | 0 refills | Status: DC

## 2017-07-03 MED ORDER — METOPROLOL SUCCINATE ER 25 MG PO TB24: 25.0000 mg | ORAL_TABLET | Freq: Every day | ORAL | 3 refills | Status: DC

## 2017-07-03 NOTE — Progress Notes (Signed)
CSW met with patient in the clinic. Patient reports he has returned to the area and is living at [facility] in [place]. Patient states he has multiple applications in area housing and is 12th on the list at an apartment tower in [place]. CSW discussed various options of housing in the [place] area and provided patient with a list. Patient requested assistance with transportation back to [place] and bus passes provided. Patient states he will stay in contact with CSW weekly and is open to restarting paramedicine program when he finds more permanent housing. CSW will continue to follow for support and needs as identified. Jackie , LCSW, CCSW-MCS [phone]   

## 2017-07-03 NOTE — Patient Instructions (Signed)
Routine lab work today. Will notify you of abnormal results, otherwise no news is good news!  No changes to medication at this time.  Follow up 3 months. Take all medication as prescribed the day of your appointment. Bring all medications with you to your appointment.  Do the following things EVERYDAY: 1) Weigh yourself in the morning before breakfast. Write it down and keep it in a log. 2) Take your medicines as prescribed 3) Eat low salt foods-Limit salt (sodium) to 2000 mg per day.  4) Stay as active as you can everyday 5) Limit all fluids for the day to less than 2 liters 

## 2017-07-09 ENCOUNTER — Ambulatory Visit: Payer: Self-pay | Admitting: Family Medicine

## 2017-07-10 ENCOUNTER — Ambulatory Visit: Payer: Self-pay | Admitting: Family Medicine

## 2017-07-11 DIAGNOSIS — I429 Cardiomyopathy, unspecified: Secondary | ICD-10-CM | POA: Diagnosis not present

## 2017-07-11 DIAGNOSIS — Z7982 Long term (current) use of aspirin: Secondary | ICD-10-CM | POA: Diagnosis not present

## 2017-07-11 DIAGNOSIS — Z113 Encounter for screening for infections with a predominantly sexual mode of transmission: Secondary | ICD-10-CM | POA: Diagnosis not present

## 2017-07-11 DIAGNOSIS — J449 Chronic obstructive pulmonary disease, unspecified: Secondary | ICD-10-CM | POA: Diagnosis not present

## 2017-07-11 DIAGNOSIS — I509 Heart failure, unspecified: Secondary | ICD-10-CM | POA: Diagnosis not present

## 2017-07-11 DIAGNOSIS — G8929 Other chronic pain: Secondary | ICD-10-CM | POA: Diagnosis not present

## 2017-07-11 DIAGNOSIS — Z202 Contact with and (suspected) exposure to infections with a predominantly sexual mode of transmission: Secondary | ICD-10-CM | POA: Diagnosis not present

## 2017-07-11 DIAGNOSIS — Z79899 Other long term (current) drug therapy: Secondary | ICD-10-CM | POA: Diagnosis not present

## 2017-07-11 DIAGNOSIS — R300 Dysuria: Secondary | ICD-10-CM | POA: Diagnosis not present

## 2017-07-11 DIAGNOSIS — I110 Hypertensive heart disease with heart failure: Secondary | ICD-10-CM | POA: Diagnosis not present

## 2017-07-11 DIAGNOSIS — Z720 Tobacco use: Secondary | ICD-10-CM | POA: Diagnosis not present

## 2017-07-16 ENCOUNTER — Ambulatory Visit: Payer: Medicare Other | Attending: Family Medicine | Admitting: Family Medicine

## 2017-07-16 ENCOUNTER — Encounter: Payer: Self-pay | Admitting: Family Medicine

## 2017-07-16 VITALS — BP 94/62 | HR 106 | Temp 98.1°F | Resp 18 | Ht 69.0 in | Wt 204.0 lb

## 2017-07-16 DIAGNOSIS — G4709 Other insomnia: Secondary | ICD-10-CM

## 2017-07-16 DIAGNOSIS — Z79899 Other long term (current) drug therapy: Secondary | ICD-10-CM | POA: Diagnosis not present

## 2017-07-16 DIAGNOSIS — F319 Bipolar disorder, unspecified: Secondary | ICD-10-CM | POA: Insufficient documentation

## 2017-07-16 DIAGNOSIS — J449 Chronic obstructive pulmonary disease, unspecified: Secondary | ICD-10-CM | POA: Insufficient documentation

## 2017-07-16 DIAGNOSIS — Z7982 Long term (current) use of aspirin: Secondary | ICD-10-CM | POA: Insufficient documentation

## 2017-07-16 DIAGNOSIS — I110 Hypertensive heart disease with heart failure: Secondary | ICD-10-CM | POA: Insufficient documentation

## 2017-07-16 DIAGNOSIS — F4310 Post-traumatic stress disorder, unspecified: Secondary | ICD-10-CM | POA: Insufficient documentation

## 2017-07-16 DIAGNOSIS — I5022 Chronic systolic (congestive) heart failure: Secondary | ICD-10-CM

## 2017-07-16 DIAGNOSIS — Z1211 Encounter for screening for malignant neoplasm of colon: Secondary | ICD-10-CM | POA: Diagnosis not present

## 2017-07-16 DIAGNOSIS — I159 Secondary hypertension, unspecified: Secondary | ICD-10-CM | POA: Diagnosis not present

## 2017-07-16 DIAGNOSIS — B20 Human immunodeficiency virus [HIV] disease: Secondary | ICD-10-CM

## 2017-07-16 DIAGNOSIS — G4700 Insomnia, unspecified: Secondary | ICD-10-CM | POA: Diagnosis not present

## 2017-07-16 DIAGNOSIS — N644 Mastodynia: Secondary | ICD-10-CM | POA: Insufficient documentation

## 2017-07-16 MED ORDER — HYDROXYZINE HCL 25 MG PO TABS: 25.0000 mg | ORAL_TABLET | Freq: Every day | ORAL | 3 refills | Status: DC

## 2017-07-16 MED ORDER — VENTOLIN HFA 108 (90 BASE) MCG/ACT IN AERS: 2.0000 | INHALATION_SPRAY | Freq: Four times a day (QID) | RESPIRATORY_TRACT | 0 refills | Status: DC | PRN

## 2017-07-16 NOTE — Progress Notes (Signed)
Subjective:  Patient ID: Ryan Long, male    DOB: 12-Jun-1961  Age: 56 y.o. MRN: [id number]  CC: Medication Refill   HPI Ryan Long is a 56 y.o. male with Medical history  significant for HIV ( undetectable viral load), COPD, CHF (EF 35-40% from 2-D echo of 08/2015),depression, hypertension who presents to the clinic for a follow-up visit.  He was last seen by cardiology 2 weeks ago where he received refills of his medications and he denies shortness of breath, pedal edema. He does complain of left-sided chest pain but then points to his breast and pain occurs only when he touches it or when he attempts to dry himself with a towel after a shower. Denies burning pain or presence of any mass.  His HIV is managed by infectious disease and he has been compliant with his antiretroviral medications.  He does not take any medication for depression as he states he is no longer depressed but he is requesting a refill of hydroxyzine which he takes for insomnia.  Informs me that he was recently seen at urgent care for bronchitis where he received antibiotic treatment a few weeks back.   Past Medical History:  Diagnosis Date  . Active smoker   . Alcohol abuse   . Anxiety   . AR (allergic rhinitis)   . Bipolar 1 disorder (HCC)   . CHF (congestive heart failure) (HCC)   . Chronic systolic heart failure (HCC)   . COPD (chronic obstructive pulmonary disease) (HCC)   . Crack cocaine use   . Depression   . Genital herpes   . HIV (human immunodeficiency virus infection) (HCC)   . HLD (hyperlipidemia)   . Hypertension   . NICM (nonischemic cardiomyopathy) (HCC)    Echocardiogram 06/28/11: EF 30-35%, mild MR, mild LAE;  No CAD by coronary CT angiogram 3/12 at [facility]  . NSVT (nonsustained ventricular tachycardia) (HCC)   . PTSD (post-traumatic stress disorder)     Past Surgical History:  Procedure Laterality Date  . NO PAST SURGERIES      No Known  Allergies   Outpatient Medications Prior to Visit  Medication Sig Dispense Refill  . aspirin 81 MG chewable tablet Chew 1 tablet (81 mg total) by mouth every morning. For heart health    . digoxin (LANOXIN) 0.125 MG tablet Take 1 tablet (0.125 mg total) by mouth daily. For control of heart arrythmia. 30 tablet 0  . dolutegravir (TIVICAY) 50 MG tablet Take 1 tablet (50 mg total) by mouth daily. 30 tablet 0  . emtricitabine-tenofovir AF (DESCOVY) 200-25 MG tablet Take 1 tablet by mouth daily. Last refill until seen at [facility] 30 tablet 0  . furosemide (LASIX) 40 MG tablet Take 1 tablet (40 mg total) by mouth daily. 30 tablet 1  . losartan (COZAAR) 50 MG tablet Take 1 tablet (50 mg total) by mouth daily. 30 tablet 3  . metoprolol succinate (TOPROL-XL) 25 MG 24 hr tablet Take 1 tablet (25 mg total) by mouth at bedtime. For high blood pressure 30 tablet 3  . Multiple Vitamins-Minerals (MULTIVITAMIN WITH MINERALS) tablet Take 1 tablet by mouth daily.    Marland Kitchen Phenir-PE-Sod Sal-Caff Cit (COUGH/COLD MEDICINE PO) Take 1 tablet by mouth daily.    Marland Kitchen spironolactone (ALDACTONE) 25 MG tablet Take 0.5 tablets (12.5 mg total) by mouth daily. 15 tablet 3  . valACYclovir (VALTREX) 500 MG tablet Take 1 tablet (500 mg total) by mouth daily. For Herpes. 30 tablet 1  . VENTOLIN  HFA 108 (90 Base) MCG/ACT inhaler Inhale 2 puffs into the lungs every 6 (six) hours as needed for wheezing or shortness of breath. 1 Inhaler 0  . hydrOXYzine (ATARAX/VISTARIL) 25 MG tablet Take 25-50 mg by mouth at bedtime.     No facility-administered medications prior to visit.     ROS Review of Systems  Constitutional: Negative for activity change and appetite change.  HENT: Negative for sinus pressure and sore throat.   Eyes: Negative for visual disturbance.  Respiratory: Negative for cough, chest tightness and shortness of breath.   Cardiovascular: Positive for chest pain. Negative for leg swelling.  Gastrointestinal: Negative for  abdominal distention, abdominal pain, constipation and diarrhea.  Endocrine: Negative.   Genitourinary: Negative for dysuria.  Musculoskeletal: Negative for joint swelling and myalgias.  Skin: Negative for rash.  Allergic/Immunologic: Negative.   Neurological: Negative for weakness, light-headedness and numbness.  Psychiatric/Behavioral: Negative for dysphoric mood and suicidal ideas.    Objective:  BP 94/62 (BP Location: Left Arm, Patient Position: Sitting, Cuff Size: Normal)   Pulse (!) 106   Temp 98.1 F (36.7 C) (Oral)   Resp 18   Ht 5\' 9"  (1.753 m)   Wt 204 lb (92.5 kg)   SpO2 98%   BMI 30.13 kg/m   BP/Weight 07/16/2017 07/03/2017 04/09/2017  Systolic BP 94 110 128  Diastolic BP 62 74 73  Wt. (Lbs) 204 204.4 -  BMI 30.13 30.18 -  Some encounter information is confidential and restricted. Go to Review Flowsheets activity to see all data.      Physical Exam  Constitutional: He is oriented to person, place, and time. He appears well-developed and well-nourished.  Cardiovascular: Normal heart sounds and intact distal pulses.  Tachycardia present.   No murmur heard. Pulmonary/Chest: Effort normal and breath sounds normal. He has no wheezes. He has no rales. He exhibits no tenderness. Right breast exhibits no mass, no skin change and no tenderness. Left breast exhibits tenderness (tenderness over the areola). Left breast exhibits no mass and no skin change.  Abdominal: Soft. Bowel sounds are normal. He exhibits no distension and no mass. There is no tenderness.  Musculoskeletal: Normal range of motion.  Neurological: He is alert and oriented to person, place, and time.     Assessment & Plan:   1.Hypertension Controlled Continue antihypertensives Low-sodium diet  2. Chronic systolic heart failure (HCC) EF 30-35% from 2-D echo of 08/2015 Euvolemic Continue fluid restriction, daily weights  3. Human immunodeficiency virus (HIV) disease (HCC) Currently on  antiretrovirals therapy  4. Other insomnia Taking hydroxyzine as needed  5. Mastodynia of left breast Likely secondary to spironolactone He will need to discuss with cardiology regarding discontinuation if he is unable if symptoms become unbearable We'll also consider left breast ultrasound  6. Screening for colon cancer - Ambulatory referral to Gastroenterology   Meds ordered this encounter  Medications  . hydrOXYzine (ATARAX/VISTARIL) 25 MG tablet    Sig: Take 1-2 tablets (25-50 mg total) by mouth at bedtime.    Dispense:  30 tablet    Refill:  3    Follow-up: Return in about 3 months (around 10/16/2017), or if symptoms worsen or fail to improve, for follow up of chronic medical conditions.   This note has been created with Education officer, environmental. Any transcriptional errors are unintentional.     Jaclyn Shaggy MD

## 2017-07-21 ENCOUNTER — Other Ambulatory Visit: Payer: Self-pay | Admitting: Infectious Disease

## 2017-07-21 DIAGNOSIS — B20 Human immunodeficiency virus [HIV] disease: Secondary | ICD-10-CM

## 2017-07-25 ENCOUNTER — Other Ambulatory Visit: Payer: Self-pay

## 2017-07-27 ENCOUNTER — Telehealth (HOSPITAL_COMMUNITY): Payer: Self-pay | Admitting: *Deleted

## 2017-07-27 MED ORDER — EPLERENONE 25 MG PO TABS: 12.5000 mg | ORAL_TABLET | Freq: Every day | ORAL | 3 refills | Status: DC

## 2017-07-27 NOTE — Telephone Encounter (Signed)
Patient called complaining of left breast lump that he was told was a side effect of his spironolactone.  He stopped taking it 10 days ago and wants to know if he can take something else.    I spoke with Maxine Glenn, PA and he advises patient to take eplerenone 12.5 mg Daily instead.  Cleda Daub discontinued and eplerenone sent to preferred pharmacy.  Patient is aware and no further questions.

## 2017-08-08 ENCOUNTER — Ambulatory Visit: Payer: Self-pay | Admitting: Infectious Disease

## 2017-08-12 ENCOUNTER — Other Ambulatory Visit: Payer: Self-pay | Admitting: Family Medicine

## 2017-08-13 ENCOUNTER — Encounter (HOSPITAL_COMMUNITY): Payer: Self-pay

## 2017-08-13 ENCOUNTER — Ambulatory Visit (HOSPITAL_COMMUNITY)
Admission: RE | Admit: 2017-08-13 | Discharge: 2017-08-13 | Disposition: A | Discharge status: Home / Self Care | Payer: Medicare Other | Source: Ambulatory Visit | Attending: Cardiology | Admitting: Cardiology

## 2017-08-13 DIAGNOSIS — I493 Ventricular premature depolarization: Secondary | ICD-10-CM | POA: Diagnosis not present

## 2017-08-13 DIAGNOSIS — R222 Localized swelling, mass and lump, trunk: Secondary | ICD-10-CM | POA: Diagnosis not present

## 2017-08-13 DIAGNOSIS — R9431 Abnormal electrocardiogram [ECG] [EKG]: Secondary | ICD-10-CM | POA: Diagnosis not present

## 2017-08-13 NOTE — Progress Notes (Signed)
CSW met with patient in the clinic. Patient shared recent medical issues with a change in medication. Patient stated he thinks his heart if beating fast and complained about a lump in his chest. Patient seen by RN for BP check and EKG.  Patient shred recent address change with CSW and that he is now 10th on the waiting list at [facility] senior building in [place]. Patient currently residing in a rooming house where is only has a small refrigerator and a microwave. Patient struggling with meals due to inability to cook on a stove. Patient hopeful to get into apartment within next few months. CSW provided information on free hot meals and food banks as well as assisted with bus passes for next clinic follow up. Patient grateful for assistance. CSW will continue to be available as needed. Raquel Sarna, [place], [place]

## 2017-08-17 ENCOUNTER — Other Ambulatory Visit: Payer: Self-pay | Admitting: Infectious Disease

## 2017-08-17 DIAGNOSIS — B20 Human immunodeficiency virus [HIV] disease: Secondary | ICD-10-CM

## 2017-08-22 ENCOUNTER — Other Ambulatory Visit: Payer: Self-pay

## 2017-08-28 ENCOUNTER — Other Ambulatory Visit: Payer: Medicare Other

## 2017-08-28 DIAGNOSIS — B20 Human immunodeficiency virus [HIV] disease: Secondary | ICD-10-CM | POA: Diagnosis not present

## 2017-08-28 LAB — CBC
HEMATOCRIT: 50 % (ref 38.5–50.0)
Hemoglobin: 17.5 g/dL — ABNORMAL HIGH (ref 13.2–17.1)
MCH: 33.2 pg — AB (ref 27.0–33.0)
MCHC: 35 g/dL (ref 32.0–36.0)
MCV: 94.9 fL (ref 80.0–100.0)
MPV: 9.5 fL (ref 7.5–12.5)
Platelets: 203 10*3/uL (ref 140–400)
RBC: 5.27 10*6/uL (ref 4.20–5.80)
RDW: 13.7 % (ref 11.0–15.0)
WBC: 3.1 10*3/uL — ABNORMAL LOW (ref 3.8–10.8)

## 2017-08-28 LAB — COMPLETE METABOLIC PANEL WITH GFR
AG Ratio: 1.9 (calc) (ref 1.0–2.5)
ALBUMIN MSPROF: 4.1 g/dL (ref 3.6–5.1)
ALKALINE PHOSPHATASE (APISO): 51 U/L (ref 40–115)
ALT: 22 U/L (ref 9–46)
AST: 21 U/L (ref 10–35)
BILIRUBIN TOTAL: 0.7 mg/dL (ref 0.2–1.2)
BUN: 10 mg/dL (ref 7–25)
CHLORIDE: 107 mmol/L (ref 98–110)
CO2: 26 mmol/L (ref 20–32)
Calcium: 9.1 mg/dL (ref 8.6–10.3)
Creat: 1.3 mg/dL (ref 0.70–1.33)
GFR, EST AFRICAN AMERICAN: 71 mL/min/{1.73_m2} (ref >=60)
GFR, Est Non African American: 61 mL/min/{1.73_m2} (ref >=60)
GLOBULIN: 2.2 g/dL (ref 1.9–3.7)
GLUCOSE: 91 mg/dL (ref 65–99)
Potassium: 4.4 mmol/L (ref 3.5–5.3)
Sodium: 140 mmol/L (ref 135–146)
Total Protein: 6.3 g/dL (ref 6.1–8.1)

## 2017-08-29 LAB — T-HELPER CELL (CD4) - (RCID CLINIC ONLY)
CD4 % Helper T Cell: 42 % (ref 33–55)
CD4 T CELL ABS: 810 /uL (ref 400–2700)

## 2017-08-30 LAB — HIV-1 RNA QUANT-NO REFLEX-BLD
HIV 1 RNA QUANT: NOT DETECTED {copies}/mL
HIV-1 RNA QUANT, LOG: NOT DETECTED {Log_copies}/mL

## 2017-09-05 ENCOUNTER — Ambulatory Visit: Payer: Self-pay | Admitting: Infectious Disease

## 2017-09-05 ENCOUNTER — Telehealth: Payer: Self-pay | Admitting: *Deleted

## 2017-09-05 NOTE — Telephone Encounter (Signed)
Patient called to reschedule his appointment today, stating he was "too tired from the hospital to come today." He states he was on hold twice for 20+ minutes while trying to call. RN apologized, explained that this has been an ongoing problem that our Engineer, manufacturing is actively trying to fix.  Patient was not satisfied, stated that he has been to our office, knows we can't possibly be that busy, that we just choose to not answer the phones. RN again offered to have patient talk to Onalee Hua, but he would not. Patient is rescheduled for 9/25 with Dr Daiva Eves. He was offered an appointment sooner, but declined several stating they were too late in the day. Patient continued to complain about the phone system but declined to speak with anyone about it today. He stated he wanted to talk to my "boss's boss in person" when he comes in next week. Andree Coss, RN

## 2017-09-10 ENCOUNTER — Encounter: Payer: Self-pay | Admitting: Licensed Clinical Social Worker

## 2017-09-10 ENCOUNTER — Telehealth (HOSPITAL_COMMUNITY): Payer: Self-pay | Admitting: Surgery

## 2017-09-10 NOTE — Telephone Encounter (Signed)
Patient met with HF Clinic SW--referral placed for HF Community Paramedicine program.  All appropriate paperwork has been sent via secure email to parmedic team.

## 2017-09-10 NOTE — Progress Notes (Signed)
Patient came to clinic today to request assistance with food. Patient reports he spent "too much money at the thrift store on clothes and shoes and I needed to buy a winter coat". Patient acknowledges that he should have purchased less and waited to get the winter coat as he doesn't need it at the moment.  CSW discussed budgeting at length and his overall health maintenance. Patient is still on waiting list for apartment in senior housing in [place]. CSW mentioned the paramedicine program and the benefits of added support for medications and health follow up. Patient agreeable to referral to paramedicine program. CSW provided patient with some food bank resources as well as a Statistician gift card and bus passes to get him through until the end of the month. CSW will continue to monitor and ordinate with the paramedicine program. Lasandra Beech, LCSW, CCSW-MCS [phone]

## 2017-09-12 ENCOUNTER — Other Ambulatory Visit: Payer: Self-pay | Admitting: Infectious Disease

## 2017-09-12 ENCOUNTER — Encounter: Payer: Self-pay | Admitting: Infectious Disease

## 2017-09-12 ENCOUNTER — Ambulatory Visit (INDEPENDENT_AMBULATORY_CARE_PROVIDER_SITE_OTHER): Payer: Medicare Other | Admitting: Infectious Disease

## 2017-09-12 VITALS — BP 112/77 | HR 116 | Temp 98.1°F | Wt 204.0 lb

## 2017-09-12 DIAGNOSIS — I159 Secondary hypertension, unspecified: Secondary | ICD-10-CM

## 2017-09-12 DIAGNOSIS — F1920 Other psychoactive substance dependence, uncomplicated: Secondary | ICD-10-CM | POA: Diagnosis not present

## 2017-09-12 DIAGNOSIS — B20 Human immunodeficiency virus [HIV] disease: Secondary | ICD-10-CM | POA: Diagnosis not present

## 2017-09-12 DIAGNOSIS — Z23 Encounter for immunization: Secondary | ICD-10-CM

## 2017-09-12 DIAGNOSIS — B009 Herpesviral infection, unspecified: Secondary | ICD-10-CM

## 2017-09-12 DIAGNOSIS — I5022 Chronic systolic (congestive) heart failure: Secondary | ICD-10-CM | POA: Diagnosis not present

## 2017-09-12 MED ORDER — BICTEGRAVIR-EMTRICITAB-TENOFOV 50-200-25 MG PO TABS: 1.0000 | ORAL_TABLET | Freq: Every day | ORAL | 5 refills | Status: DC

## 2017-09-12 MED ORDER — VALACYCLOVIR HCL 500 MG PO TABS: 500.0000 mg | ORAL_TABLET | Freq: Every day | ORAL | 1 refills | Status: DC

## 2017-09-12 NOTE — Progress Notes (Signed)
Subjective:   Chief complaint: followup for HIV on medications.     Patient ID: Ryan Long, male    DOB: 1961-10-28, 56 y.o.   MRN: [id number]  HPI  56 year old man with HIV and host of other medical problems including depression with suicidal ideation, alcohol abuse, COPD, heart failure, who has had care for his HIV at various places including here in [place] at [facility] and [address], [employer], [place]. He had not been seen here since 2015 then in 2016ad[facility] to [facility] and asked to be followed in [facility] again.   The last time I law him was in fall of 2016 and at that time he was acting very inappropriately and belligerently.   I have not seen him since then. He has remained on his medications and his viral load was undetectable earlier this month. He tells me he has gotten medications filled why his living in [place] and also in [place] when he was an inpatient behavioral health he says he staying clear of alcohol and drugs and it is our: Drug use caused him to miss medication from time to time.  I noticed that he had refills with my name on them for [facility] and DESCOVY with one having been filled on August 31 the other on September 4. I propose simplifying him to a single tablet regimen in the form of BIKTARVY and he was agreeable to this. He was also agreeable to eventually having his medications transferred to [facility] but at present would prefer to keep it current pharmacy as he does not have an ideal location to have medications mailed to him and [place] to [place] is inconvenient for him.    Lab Results  Component Value Date   HIV1RNAQUANT <20 NOT DETECTED 08/28/2017   HIV1RNAQUANT 71 (H) 04/18/2016   HIV1RNAQUANT <20 10/13/2015   Lab Results  Component Value Date   CD4TABS 810 08/28/2017   CD4TABS 710 04/18/2016   CD4TABS 590 10/13/2015   Past Medical History:  Diagnosis Date  . Active smoker   . Alcohol abuse   . Anxiety   . AR (allergic  rhinitis)   . Bipolar 1 disorder (HCC)   . CHF (congestive heart failure) (HCC)   . Chronic systolic heart failure (HCC)   . COPD (chronic obstructive pulmonary disease) (HCC)   . Crack cocaine use   . Depression   . Genital herpes   . HIV (human immunodeficiency virus infection) (HCC)   . HLD (hyperlipidemia)   . Hypertension   . NICM (nonischemic cardiomyopathy) (HCC)    Echocardiogram 06/28/11: EF 30-35%, mild MR, mild LAE;  No CAD by coronary CT angiogram 3/12 at [facility]  . NSVT (nonsustained ventricular tachycardia) (HCC)   . PTSD (post-traumatic stress disorder)     Past Surgical History:  Procedure Laterality Date  . NO PAST SURGERIES      Family History  Problem Relation Age of Onset  . Hypertension Father   . CAD Father       Social History   Social History  . Marital status: Single    Spouse name: N/A  . Number of children: N/A  . Years of education: N/A   Social History Main Topics  . Smoking status: Current Every Day Smoker    Packs/day: 0.75    Years: 30.00    Types: Cigarettes  . Smokeless tobacco: Never Used     Comment: 15 cigarettes daily  . Alcohol use  3.6 oz/week    6 Cans of beer per week     Comment: reports drinks 6+ beers 3 times a month  . Drug use: Yes    Types: Cocaine     Comment: reports uses crack cocaine 3 times a month  . Sexual activity: Yes    Birth control/ protection: Condom     Comment: pt. given condoms   Other Topics Concern  . None   Social History Narrative  . None    No Known Allergies   Current Outpatient Prescriptions:  .  aspirin 81 MG chewable tablet, Chew 1 tablet (81 mg total) by mouth every morning. For heart health, Disp: , Rfl:  .  digoxin (LANOXIN) 0.125 MG tablet, Take 1 tablet (0.125 mg total) by mouth daily. For control of heart arrythmia., Disp: 30 tablet, Rfl: 0 .  eplerenone (INSPRA) 25 MG tablet, Take 0.5 tablets (12.5 mg total) by mouth daily., Disp: 45 tablet, Rfl: 3 .   furosemide (LASIX) 40 MG tablet, Take 1 tablet (40 mg total) by mouth daily., Disp: 30 tablet, Rfl: 1 .  hydrOXYzine (ATARAX/VISTARIL) 25 MG tablet, Take 1-2 tablets (25-50 mg total) by mouth at bedtime., Disp: 30 tablet, Rfl: 3 .  losartan (COZAAR) 50 MG tablet, Take 1 tablet (50 mg total) by mouth daily., Disp: 30 tablet, Rfl: 3 .  metoprolol succinate (TOPROL-XL) 25 MG 24 hr tablet, Take 1 tablet (25 mg total) by mouth at bedtime. For high blood pressure, Disp: 30 tablet, Rfl: 3 .  Multiple Vitamins-Minerals (MULTIVITAMIN WITH MINERALS) tablet, Take 1 tablet by mouth daily., Disp: , Rfl:  .  valACYclovir (VALTREX) 500 MG tablet, Take 1 tablet (500 mg total) by mouth daily. For Herpes., Disp: 30 tablet, Rfl: 1 .  VENTOLIN HFA 108 (90 Base) MCG/ACT inhaler, INHALE 2 PUFFS INTO THE LUNGS EVERY 6 HOURS AS NEEDED FOR WHEEZING OR SHORTNESS OF BREATH, Disp: 18 g, Rfl: 1 .  bictegravir-emtricitabine-tenofovir AF (BIKTARVY) 50-200-25 MG TABS tablet, Take 1 tablet by mouth daily., Disp: 30 tablet, Rfl: 5 .  Phenir-PE-Sod Sal-Caff Cit (COUGH/COLD MEDICINE PO), Take 1 tablet by mouth daily., Disp: , Rfl:      Review of Systems  Constitutional: Negative for activity change, appetite change, chills, diaphoresis, fatigue, fever and unexpected weight change.  HENT: Negative for congestion, sneezing, sore throat and trouble swallowing.   Respiratory: Negative for cough, shortness of breath and wheezing.   Cardiovascular: Negative for chest pain.  Gastrointestinal: Negative for abdominal distention, constipation, diarrhea and vomiting.  Genitourinary: Negative for difficulty urinating and dysuria.  Musculoskeletal: Negative for back pain.  Skin: Negative for rash and wound.  Hematological: Negative for adenopathy.  Psychiatric/Behavioral: Negative for agitation, behavioral problems, confusion, decreased concentration, dysphoric mood, sleep disturbance and suicidal ideas. The patient is not nervous/anxious.          Objective:   Physical Exam  Constitutional: He is oriented to person, place, and time. He appears well-developed and well-nourished.  HENT:  Head: Normocephalic and atraumatic.  Eyes: Conjunctivae and EOM are normal.  Neck: Normal range of motion. Neck supple.  Cardiovascular: Normal rate and regular rhythm.   Pulmonary/Chest: Effort normal. No respiratory distress. He has no wheezes.  Abdominal: Soft. He exhibits no distension.  Musculoskeletal: Normal range of motion. He exhibits no edema or tenderness.  Neurological: He is alert and oriented to person, place, and time.  Skin: Skin is warm and dry. No rash noted. No erythema. No pallor.  Psychiatric: He has a  normal mood and affect. His speech is normal and behavior is normal. Judgment and thought content normal. His mood appears not anxious. He is not agitated. Cognition and memory are normal. Cognition and memory are not impaired.          Assessment & Plan:   HIV: We will switch him to [facility] and he will follow-up with Cassie and roughly a month. He can be seen here on a yearly basis provided he can continue to maintain height parents to his antiretroviral medications.   BIpolar disorder with substance abuse : Critical that he stays on top of this would see if he is agreeable to seeing Cordelia Pen at next visit. I tried little lightly this time with him because of my last encounter with him 2 years ago. I suspect some of it was gone time was due to his dual diagnoses not being well managed at the time.  I spent greater than 25 minutes with the patient including greater than 50% of time in face to face counsel of the patient regarding need to continue to be highly adherent to his medications desire ability of a single tablet regimen so that he would be taking all his medications without concern that may be one medicine might be filled in on the other with planning to have meds filled in the future through [facility] outpatient  pharmacy, also counseling regarding his substance abuse and depressive disorder and in coordination of his care.

## 2017-09-12 NOTE — Progress Notes (Signed)
HPI: Ryan Long is a 56 y.o. male who presents to the [facility] clinic today to follow-up with Dr. Daiva Eves for his HIV infectionBerthold Glacegies: No Known Allergies  Past Medical History: Past Medical History:  Diagnosis Date  . Active smoker   . Alcohol abuse   . Anxiety   . AR (allergic rhinitis)   . Bipolar 1 disorder (HCC)   . CHF (congestive heart failure) (HCC)   . Chronic systolic heart failure (HCC)   . COPD (chronic obstructive pulmonary disease) (HCC)   . Crack cocaine use   . Depression   . Genital herpes   . HIV (human immunodeficiency virus infection) (HCC)   . HLD (hyperlipidemia)   . Hypertension   . NICM (nonischemic cardiomyopathy) (HCC)    Echocardiogram 06/28/11: EF 30-35%, mild MR, mild LAE;  No CAD by coronary CT angiogram 3/12 at [facility]  . NSVT (nonsustained ventricular tachycardia) (HCC)   . PTSD (post-traumatic stress disorder)     Social History: Social History   Social History  . Marital status: Single    Spouse name: N/A  . Number of children: N/A  . Years of education: N/A   Social History Main Topics  . Smoking status: Current Every Day Smoker    Packs/day: 0.75    Years: 30.00    Types: Cigarettes  . Smokeless tobacco: Never Used     Comment: 15 cigarettes daily  . Alcohol use 3.6 oz/week    6 Cans of beer per week     Comment: reports drinks 6+ beers 3 times a month  . Drug use: Yes    Types: Cocaine     Comment: reports uses crack cocaine 3 times a month  . Sexual activity: Yes    Birth control/ protection: Condom     Comment: pt. given condoms   Other Topics Concern  . None   Social History Narrative  . None    Current Regimen: Tivicay and Descovy  Labs: HIV 1 RNA Quant (copies/mL)  Date Value  08/28/2017 <20 NOT DETECTED  04/18/2016 71 (H)  10/13/2015 <20   CD4 T Cell Abs (/uL)  Date Value  08/28/2017 810  04/18/2016 710  10/13/2015 590   Hep B S Ab (no units)  Date Value  02/08/2009 INDETER  (A)   Hepatitis B Surface Ag (no units)  Date Value  02/08/2009 NEG   HCV Ab (no units)  Date Value  02/08/2009 NEG    CrCl: Estimated Creatinine Clearance: 71.3 mL/min (by C-G formula based on SCr of 1.3 mg/dL).  Lipids:    Component Value Date/Time   CHOL 150 04/18/2016 1622   TRIG 193 (H) 04/18/2016 1622   HDL 61 04/18/2016 1622   CHOLHDL 2.5 04/18/2016 1622   VLDL 39 (H) 04/18/2016 1622   LDLCALC 50 04/18/2016 1622    Assessment: Ryan Long is here today to follow-up for his HIV infection. He has not been seen in our clinic since May 2017 but it seems he may have been seeing someone at [facility]. Nevertheless, his labs look good today so he is taking his medication.  We will change him to [facility] today for simplification.  I explained the medication to him and how to take it.  I told him to stop the Tivicay and Descovy when he picks up his new medication.  He was going to transfer his medications to [facility] but does not have stable housing to mail so we will revisit this  in 1-2 months.  He will come back and see me in 1 month for labs and adherence counseling.   Plans: - Stop Tivicay and Descovy - Start Biktarvy PO once daily - F/u with me 10/30 at 930am  Magdiel Bartles L. Missael Ferrari, PharmD, CPP Infectious Diseases Clinical Pharmacist [facility] 09/12/2017, 11:56 AM

## 2017-09-13 ENCOUNTER — Other Ambulatory Visit (HOSPITAL_COMMUNITY): Payer: Self-pay

## 2017-09-13 ENCOUNTER — Telehealth (HOSPITAL_COMMUNITY): Payer: Self-pay

## 2017-09-13 NOTE — Telephone Encounter (Signed)
I called Ryan Long to schedule an appointment. I left a voicemail with my name and phone number and requested he call me back.

## 2017-09-13 NOTE — Progress Notes (Signed)
Paramedicine Encounter    Patient ID: Ryan Long, male    DOB: February 12, 1961, 56 y.o.   MRN: [id number]   Patient Care Team: Jaclyn Shaggy, MD as PCP - General (Family Medicine)  Patient Active Problem List   Diagnosis Date Noted  . Insomnia 07/16/2017  . Screening examination for venereal disease 04/18/2016  . Encounter for long-term (current) use of medications 04/18/2016  . Major depressive disorder, recurrent (HCC) 10/08/2015  . COPD (chronic obstructive pulmonary disease) (HCC) 10/04/2015  . Abnormal thyroid stimulating hormone (TSH) level 10/04/2015  . Severe episode of recurrent major depressive disorder, without psychotic features (HCC)   . MDD (major depressive disorder), recurrent episode, severe (HCC) 09/23/2015  . HTN (hypertension) 09/10/2015  . MDD (major depressive disorder), recurrent, severe, with psychosis (HCC) 08/07/2015  . PTSD (post-traumatic stress disorder) 08/07/2015  . Cocaine use disorder, severe, dependence (HCC) 08/07/2015  . Alcohol use disorder, severe, dependence (HCC) 08/07/2015  . Tobacco use disorder 08/07/2015  . Suicidal ideation   . Polysubstance (excluding opioids) dependence with physiological dependence (HCC) 12/30/2013  . Substance induced mood disorder (HCC) 11/22/2013  . Cocaine abuse, episodic use 03/20/2012    Class: Acute  . Alcohol dependence (HCC) 03/20/2012    Class: Acute  . Chronic systolic heart failure (HCC)   . Cardiomyopathy, nonischemic (HCC) 08/24/2011  . Human immunodeficiency virus (HIV) disease (HCC) 02/01/2009  . Recurrent HSV (herpes simplex virus) 02/01/2009  . HYPERLIPIDEMIA 02/01/2009  . ALLERGIC RHINITIS 02/01/2009    Current Outpatient Prescriptions:  .  aspirin 81 MG chewable tablet, Chew 1 tablet (81 mg total) by mouth every morning. For heart health, Disp: , Rfl:  .  bictegravir-emtricitabine-tenofovir AF (BIKTARVY) 50-200-25 MG TABS tablet, Take 1 tablet by mouth daily., Disp: 30 tablet, Rfl: 5 .  digoxin  (LANOXIN) 0.125 MG tablet, Take 1 tablet (0.125 mg total) by mouth daily. For control of heart arrythmia., Disp: 30 tablet, Rfl: 0 .  eplerenone (INSPRA) 25 MG tablet, Take 0.5 tablets (12.5 mg total) by mouth daily., Disp: 45 tablet, Rfl: 3 .  furosemide (LASIX) 40 MG tablet, Take 1 tablet (40 mg total) by mouth daily., Disp: 30 tablet, Rfl: 1 .  hydrOXYzine (ATARAX/VISTARIL) 25 MG tablet, Take 1-2 tablets (25-50 mg total) by mouth at bedtime., Disp: 30 tablet, Rfl: 3 .  losartan (COZAAR) 50 MG tablet, Take 1 tablet (50 mg total) by mouth daily., Disp: 30 tablet, Rfl: 3 .  metoprolol succinate (TOPROL-XL) 25 MG 24 hr tablet, Take 1 tablet (25 mg total) by mouth at bedtime. For high blood pressure, Disp: 30 tablet, Rfl: 3 .  Multiple Vitamins-Minerals (MULTIVITAMIN WITH MINERALS) tablet, Take 1 tablet by mouth daily., Disp: , Rfl:  .  valACYclovir (VALTREX) 500 MG tablet, Take 1 tablet (500 mg total) by mouth daily. For Herpes., Disp: 30 tablet, Rfl: 1 .  VENTOLIN HFA 108 (90 Base) MCG/ACT inhaler, INHALE 2 PUFFS INTO THE LUNGS EVERY 6 HOURS AS NEEDED FOR WHEEZING OR SHORTNESS OF BREATH, Disp: 18 g, Rfl: 1 .  Phenir-PE-Sod Sal-Caff Cit (COUGH/COLD MEDICINE PO), Take 1 tablet by mouth daily., Disp: , Rfl:  No Known Allergies   Social History   Social History  . Marital status: Single    Spouse name: N/A  . Number of children: N/A  . Years of education: N/A   Occupational History  . Not on file.   Social History Main Topics  . Smoking status: Current Every Day Smoker    Packs/day: 0.75  Years: 30.00    Types: Cigarettes  . Smokeless tobacco: Never Used     Comment: 15 cigarettes daily  . Alcohol use 3.6 oz/week    6 Cans of beer per week     Comment: reports drinks 6+ beers 3 times a month  . Drug use: Yes    Types: Cocaine     Comment: reports uses crack cocaine 3 times a month  . Sexual activity: Yes    Birth control/ protection: Condom     Comment: pt. given condoms    Other Topics Concern  . Not on file   Social History Narrative  . No narrative on file    Physical Exam  Constitutional: He is oriented to person, place, and time.  Cardiovascular: Normal rate and regular rhythm.   Pulmonary/Chest: Effort normal and breath sounds normal. No respiratory distress. He has no wheezes. He has no rales.  Abdominal: Soft.  Musculoskeletal: Normal range of motion. He exhibits no edema.  Neurological: He is alert and oriented to person, place, and time.  Skin: Skin is warm and dry.  Psychiatric: He has a normal mood and affect.        SAFE - 09/13/17 1200      Situation   Admitting diagnosis chf   Heart failure history Exisiting   Comorbidities COPD;HTN   Readmitted within 30 days No   Hospital admission within past 12 months Yes   number of hospital admissions 2   number of ED visits 2     Assessment   Lives alone No   Primary support person None   Mode of transportation public bus   Other services involved None   Home equipement Scale     Weight   Weighs self daily No   Scale provided Yes   Records on weight chart No     Resources   Has "Living better w/heart failure" book No   Has HF Zone tool No   Able to identify yellow zone signs/when to call MD No   Records zone daily No     Medications   Uses a pill box Yes   Who stocks the pill box self   Pill box checked this visit Yes   Pill box refilled this visit No   Difficulty obtaining medications No   Mail order medications No   Missed one or more doses of medications per week No     Nutrition   Patient receives meals on wheels No   Patient follows low sodium diet Yes   Has foods at home that meet the current recommended diet Yes   Patient follows low sugar/card diet No   Nutritional concerns/issues Financial      Activity Level   ADL's/Mobility Independent   How many feet can patient ambulate 2-3 blocks   Typical activity level Inactive     Urine   Difficulty  urinating No   Changes in urine None     Time spent with patient   Time spent with patient  70 Minutes         Community Paramedic Living Environment - 09/13/17 1200      Outside of House   Sidewalk and pathway to house is level and free from any hazards Yes   Driveway is free from debris/snow/ice Yes   Outside stairs are stable and have sturdy handrail Yes   Porch lights are working and provide adequate lighting Yes     Living Room   [employer]  is of adequate height and offers arm rests that assist in getting up and down N/A   Floor is free from any clutter that would create tripping hazards N/A   All cords are either behind furniture or secured in a manner that does not cause trip hazards N/A   All rugs are secured to floor with double-sided tape N/A   Lighting is adequate to light room N/A   All lighting has an easily accessible on/off switch N/A   Phone is readily accessible near favorite seating areas N/A   Emergency numbers are printed near all phones in house N/A     Kitchen   Items used most often are within easy reach on low shelves N/A   Step stool is present, is sturdy and has a handrail N/A   Floor mats are non-slip tread and secured to floor N/A   Oven controls are within easy reach N/A   Kitchen lighting is adequate and easy to reach switches N/A   ABC fire extinguisher is located in kitchen N/A     Stairs   Carpet is properly secured to stairs and/or all wood is properly secured Yes   Handrail is present and sturdy Yes   Stairs are free from any clutter Yes   Stairway is adequately lit Yes     Bathroom   Tub and shower have a non-slip surface No   Tub and/or shower have a grab bar for stability No   Toilet has a raised seat No   Grab bar is attached near toilet for assistance No   Pathway from bedroom to bathroom is free from clutter and well lit for ease of movement in the middle of the night Yes     Bedroom   Floor is free from clutter Yes   Light is  near bed and is easy to turn on Yes   Phone is next to bed and within easy reach Yes   Flashlight is near bed in case of emergency Yes     General   Smoke detectors in all areas of the house (each floor) and tested Yes   CO detectors on each floor of house and tested N/A   Flashlights are handy throughout the home N/A   Resident has all medical information readily available and in an area emergency providers will easily find Yes   All heaters are away from any type of flammable material No     Overall Tips   Homeowner ha good non-skid shoes to move around house Yes   All assisted walking devices are readily accessible and in good condition N/A   There is a phone near the floor for ease of reach in case of a fall YES   All O2 tubing is less than 50 ft. and is not a trip hazard N/A   Resident has had an annual hearing and vision check by a physician No   Resident has the proper hearing and visual aids prescribed and are in good working order No   All medications are properly stored and labeled to avoid confusion on dosage, time to take, and avoidance of missed doses Yes       Future Appointments Date Time Provider Department Center  10/03/2017 10:30 AM [facility] PA/NP [facility] None  10/16/2017 9:30 AM [facility] PHARMACIST [facility]  10/25/2017 10:30 AM Jaclyn Shaggy, MD [facility] None  09/02/2018 9:45 AM [facility] LAB [facility]  09/16/2018 9:45 AM Daiva Eves, Lisette Grinder, MD [facility]  BP 118/84 (BP Location: Left Arm, Patient Position: Sitting, Cuff Size: Normal)   Pulse 90   Resp 16   SpO2 97%  Weight yesterday-Unable to weigh Last visit weight- N/A  Ryan Long was seen at home today for our initial visit. He stated he was feeling well and denied SOB, dizziness, headache or orthopnea. He is currently living in group housing and does not have means to cook using anything except a microwave oven. With that being the case, he bough Ramen Noodles, canned beef stew and canned  chili to eat. I pointed out the sodium content on these items and explained that he is only allowed 2000 mg of sodium per day and that was no feasible with these food options. He is currently on the waiting list for an apartment of his own but in the mean time I will explore healthier eating options for him. He also does not have a scale and needs one to be provided by the clinic. His medications were verified and he fills his pillbox without assistance. He is currently a smoker and I hope to bring up the subject of cessation at our next meeting.   Time spent with patient: 45 minutes   Jacqualine Code, EMT 09/13/17  ACTION: Home visit completed Next visit planned for 1 week

## 2017-09-14 ENCOUNTER — Other Ambulatory Visit: Payer: Self-pay | Admitting: Infectious Disease

## 2017-09-14 DIAGNOSIS — B20 Human immunodeficiency virus [HIV] disease: Secondary | ICD-10-CM

## 2017-09-19 ENCOUNTER — Telehealth (HOSPITAL_COMMUNITY): Payer: Self-pay

## 2017-09-20 NOTE — Telephone Encounter (Signed)
Ryan Long was called today to schedule an appointment but he did not answer the phone. I left a voicemail with my information and why I was calling and requesting he call me back.

## 2017-09-21 ENCOUNTER — Telehealth (HOSPITAL_COMMUNITY): Payer: Self-pay

## 2017-09-21 NOTE — Telephone Encounter (Addendum)
I called Ryan Long today to schedule an appointment. He did not answer the phone so a voicemail was left requesting he call me back so we can schedule an appointment.

## 2017-09-27 ENCOUNTER — Other Ambulatory Visit (HOSPITAL_COMMUNITY): Payer: Self-pay | Admitting: Adult Health

## 2017-09-27 DIAGNOSIS — I5022 Chronic systolic (congestive) heart failure: Secondary | ICD-10-CM

## 2017-10-03 ENCOUNTER — Ambulatory Visit (HOSPITAL_COMMUNITY)
Admission: RE | Admit: 2017-10-03 | Discharge: 2017-10-03 | Disposition: A | Discharge status: Home / Self Care | Payer: Medicare Other | Source: Ambulatory Visit | Attending: Cardiology | Admitting: Cardiology

## 2017-10-03 ENCOUNTER — Encounter (HOSPITAL_COMMUNITY): Payer: Self-pay

## 2017-10-03 ENCOUNTER — Other Ambulatory Visit (HOSPITAL_COMMUNITY): Payer: Self-pay

## 2017-10-03 ENCOUNTER — Telehealth (HOSPITAL_COMMUNITY): Payer: Self-pay

## 2017-10-03 VITALS — BP 106/80 | HR 104 | Wt 202.4 lb

## 2017-10-03 DIAGNOSIS — Z7982 Long term (current) use of aspirin: Secondary | ICD-10-CM | POA: Insufficient documentation

## 2017-10-03 DIAGNOSIS — B20 Human immunodeficiency virus [HIV] disease: Secondary | ICD-10-CM | POA: Diagnosis not present

## 2017-10-03 DIAGNOSIS — I429 Cardiomyopathy, unspecified: Secondary | ICD-10-CM | POA: Diagnosis not present

## 2017-10-03 DIAGNOSIS — F17200 Nicotine dependence, unspecified, uncomplicated: Secondary | ICD-10-CM | POA: Diagnosis not present

## 2017-10-03 DIAGNOSIS — I110 Hypertensive heart disease with heart failure: Secondary | ICD-10-CM | POA: Insufficient documentation

## 2017-10-03 DIAGNOSIS — I5022 Chronic systolic (congestive) heart failure: Secondary | ICD-10-CM | POA: Diagnosis not present

## 2017-10-03 DIAGNOSIS — N63 Unspecified lump in unspecified breast: Secondary | ICD-10-CM | POA: Diagnosis not present

## 2017-10-03 DIAGNOSIS — I159 Secondary hypertension, unspecified: Secondary | ICD-10-CM

## 2017-10-03 DIAGNOSIS — J449 Chronic obstructive pulmonary disease, unspecified: Secondary | ICD-10-CM | POA: Diagnosis not present

## 2017-10-03 DIAGNOSIS — E785 Hyperlipidemia, unspecified: Secondary | ICD-10-CM | POA: Diagnosis not present

## 2017-10-03 DIAGNOSIS — F1411 Cocaine abuse, in remission: Secondary | ICD-10-CM | POA: Diagnosis not present

## 2017-10-03 DIAGNOSIS — I472 Ventricular tachycardia: Secondary | ICD-10-CM | POA: Insufficient documentation

## 2017-10-03 DIAGNOSIS — Z79899 Other long term (current) drug therapy: Secondary | ICD-10-CM | POA: Diagnosis not present

## 2017-10-03 DIAGNOSIS — J438 Other emphysema: Secondary | ICD-10-CM

## 2017-10-03 DIAGNOSIS — B009 Herpesviral infection, unspecified: Secondary | ICD-10-CM | POA: Diagnosis not present

## 2017-10-03 LAB — CBC
HCT: 50.8 % (ref 39.0–52.0)
HEMOGLOBIN: 18.2 g/dL — AB (ref 13.0–17.0)
MCH: 33.3 pg (ref 26.0–34.0)
MCHC: 35.8 g/dL (ref 30.0–36.0)
MCV: 92.9 fL (ref 78.0–100.0)
Platelets: 176 10*3/uL (ref 150–400)
RBC: 5.47 MIL/uL (ref 4.22–5.81)
RDW: 13.6 % (ref 11.5–15.5)
WBC: 2.7 10*3/uL — ABNORMAL LOW (ref 4.0–10.5)

## 2017-10-03 LAB — BASIC METABOLIC PANEL
ANION GAP: 11 (ref 5–15)
BUN: 11 mg/dL (ref 6–20)
CHLORIDE: 103 mmol/L (ref 101–111)
CO2: 23 mmol/L (ref 22–32)
Calcium: 8.9 mg/dL (ref 8.9–10.3)
Creatinine, Ser: 1.34 mg/dL — ABNORMAL HIGH (ref 0.61–1.24)
GFR calc non Af Amer: 58 mL/min — ABNORMAL LOW (ref >60)
Glucose, Bld: 143 mg/dL — ABNORMAL HIGH (ref 65–99)
POTASSIUM: 3.4 mmol/L — AB (ref 3.5–5.1)
Sodium: 137 mmol/L (ref 135–145)

## 2017-10-03 LAB — DIGOXIN LEVEL: Digoxin Level: 0.5 ng/mL — ABNORMAL LOW (ref 0.8–2.0)

## 2017-10-03 MED ORDER — METOPROLOL SUCCINATE ER 50 MG PO TB24: 50.0000 mg | ORAL_TABLET | Freq: Every day | ORAL | 6 refills | Status: DC

## 2017-10-03 NOTE — Progress Notes (Signed)
Paramedicine Encounter   Patient ID: Ryan Long , male,   DOB: Feb 08, 1961,56 y.o.,  MRN: [id number]   Met patient in clinic today with provider. He reports having chest tenderness which has been persistent for a prolonged period of time. Spironolactone was discontinued a while ago and should no longer be a consideration for the cause of this tenderness. And has advised him to contact his PCP for further evaluation. Metoprolol was increased from 25 mg daily to 50 mg daily and were placed in the evening slot for him.    Time spent with patient: 25 minutes    Jacquiline Doe, EMT 10/03/2017   ACTION: Next visit planned for 1 week

## 2017-10-03 NOTE — Telephone Encounter (Signed)
CHF Clinic appointment reminder call placed to patient for upcoming post-hospital follow up.  LVMT[facility] to confirm apt.  Patient also reminded to take all medications as prescribed on the day of his/her appointment and to bring all medications to this appointment.  Advised to call our office for tardiness or cancellations/rescheduling needs.  .Terran Hollenkamp Genevea  

## 2017-10-03 NOTE — Patient Instructions (Signed)
INCREASE Toprol XL to 50 mg every day at bedtime.  Routine lab work today. Will notify you of abnormal results, otherwise no news is good news!  Will schedule you for an echocardiogram at [facility]. Address: [address] ([address] Floor), [place], [place] [address]  Phone: [phone]  Follow up 3 months.  Take all medication as prescribed the day of your appointment. Bring all medications with you to your appointment.  Do the following things EVERYDAY: 1) Weigh yourself in the morning before breakfast. Write it down and keep it in a log. 2) Take your medicines as prescribed 3) Eat low salt foods-Limit salt (sodium) to 2000 mg per day.  4) Stay as active as you can everyday 5) Limit all fluids for the day to less than 2 liters

## 2017-10-03 NOTE — Progress Notes (Signed)
Advanced Heart Failure Clinic Note   Patient ID: Ryan Long, male   DOB: 1961/11/22, 56 y.o.   MRN: [id number] ID: Ryan Long PCP: Ryan Long  Ryan Long is a 56 y.o. male with a history of HIV, hypertension, major depressive disorder, chronic systolic heart failure (nonischemic cardiomyopathy), and polysubstance abuse.  Coronary CT angiogram in 3/12 demonstrated no CAD.  Cardiolite in 2015 showed no ischemia or infarction.  NICM was not felt to be related to HIV given his good CD4 counts.    He presents today for regular follow up. Overall feeling well.  Denies SOB/PNd or orthopnea. Trying to cut back smoking. No further cocaine use. Taking all medications as directed. Living at a shelter in [place]. Paramedicine follows.  He has continued to have R breast tenderness despite stopping spiro many months ago. Has a "knot" that he can feel and is tender.   ECHO 06/2013 EF 35-40% ECHO 9/16 with EF 30-35%, diffuse hypokinesis, grade I diastolic dysfunction,   Past Medical History:  Diagnosis Date  . Active smoker   . Alcohol abuse   . Anxiety   . AR (allergic rhinitis)   . Bipolar 1 disorder (HCC)   . CHF (congestive heart failure) (HCC)   . Chronic systolic heart failure (HCC)   . COPD (chronic obstructive pulmonary disease) (HCC)   . Crack cocaine use   . Depression   . Genital herpes   . HIV (human immunodeficiency virus infection) (HCC)   . HLD (hyperlipidemia)   . Hypertension   . NICM (nonischemic cardiomyopathy) (HCC)    Echocardiogram 06/28/11: EF 30-35%, mild MR, mild LAE;  No CAD [facility]  . NSVT (nonsustained ventricular tachycardia) (HCC)   . PTSD (post-traumatic stress disorder)     Current Outpatient Prescriptions  Medication Sig Dispense Refill  . aspirin 81 MG chewable tablet Chew 1 tablet (81 mg total) by mouth every morning. For heart health    . bictegravir-emtricitabine-tenofovir AF (BIKTARVY) 50-200-25 MG  TABS tablet Take 1 tablet by mouth daily. 30 tablet 5  . DIGOX 125 MCG tablet TAKE 1 TABLET BY MOUTH DAILY( FOR CONTROL OF HEART ARRYTHMIA) 30 tablet 3  . eplerenone (INSPRA) 25 MG tablet Take 0.5 tablets (12.5 mg total) by mouth daily. 45 tablet 3  . furosemide (LASIX) 40 MG tablet Take 1 tablet (40 mg total) by mouth daily. 30 tablet 1  . hydrOXYzine (ATARAX/VISTARIL) 25 MG tablet Take 1-2 tablets (25-50 mg total) by mouth at bedtime. 30 tablet 3  . losartan (COZAAR) 50 MG tablet Take 1 tablet (50 mg total) by mouth daily. 30 tablet 3  . metoprolol succinate (TOPROL-XL) 25 MG 24 hr tablet Take 1 tablet (25 mg total) by mouth at bedtime. For high blood pressure 30 tablet 3  . Multiple Vitamins-Minerals (MULTIVITAMIN WITH MINERALS) tablet Take 1 tablet by mouth daily.    Marland Kitchen. Phenir-PE-Sod Sal-Caff Cit (COUGH/COLD MEDICINE PO) Take 1 tablet by mouth daily.    . valACYclovir (VALTREX) 500 MG tablet Take 1 tablet (500 mg total) by mouth daily. For Herpes. 30 tablet 1  . VENTOLIN HFA 108 (90 Base) MCG/ACT inhaler INHALE 2 PUFFS INTO THE LUNGS EVERY 6 HOURS AS NEEDED FOR WHEEZING OR SHORTNESS OF BREATH 18 g 1   No current facility-administered medications for this encounter.     Allergies: No Known Allergies   Review of systems complete and found to be negative unless listed in HPI.  Vital Signs:  Vitals:   10/03/17 1044  BP: 106/80  Pulse: (!) 104  SpO2: 98%  Weight: 202 lb 6.4 oz (91.8 kg)    Wt Readings from Last 3 Encounters:  10/03/17 202 lb 6.4 oz (91.8 kg)  09/12/17 204 lb (92.5 kg)  08/13/17 208 lb 12.8 oz (94.7 kg)    PHYSICAL EXAM: General: Well appearing. No resp difficulty. HEENT: Normal Neck: Supple. JVP 5-6. Carotids 2+ bilat; no bruits. No thyromegaly or nodule noted. Cor: PMI nondisplaced. RRR, No M/G/R noted Lungs: CTAB, normal effort. Abdomen: Soft, non-tender, non-distended, no HSM. No bruits or masses. +BS  Extremities: No cyanosis, clubbing, or rash. R and  LLE no edema.  Neuro: Alert & orientedx3, cranial nerves grossly intact. moves all 4 extremities w/o difficulty. Affect pleasant  Breast: L breast with small nodule inferior to nipple and in LLQ. Tender to palpation.    ASSESSMENT AND PLAN:  1. Chronic Systolic Heart Failure: Nonischemic cardiomyopathy.  Echo (9/16) with EF 30-35%, diffuse hypokinesis.  - NYHA class II symptoms - Volume status stable on exam.  - Continue lasix 40 mg daily. BMET today.  - Increase toprol xl to 50 mg daily.  - Continue spiro 12.5 mg daily - Continue digoxin 0.125 mg daily. Level stable 09/25/2016 Will check level today.  - Will repeat Echo.  2. HIV:  - He has follow up with Ryan Daiva Eves.  No change.  3. Tobacco use disorder:   - Encouraged complete cessation.  4. Former Cocaine - Congratulated on abstinence.  5. Breast nodule and tenderness - He sees PCP next week. Encouraged follow up.  - This has not improved off spiro. ? Malignant changes.  Labs and meds as above. Repeat Echo. Follow up 3 months.   Ryan Freer, PA-C  10/03/2017   Greater than 50% of the 25 minute visit was spent in counseling/coordination of care regarding disease state education, sliding scale diuretics, salt/fluid restriction, and medication reconciliation.

## 2017-10-04 NOTE — Progress Notes (Signed)
Paramedicine CSW Assessment  Ryan Long is enrolled in the [employer] Paramedicine Program through [facility].  The patient presents today in association with an Advanced Heart Failure Clinic Appointment.  Patient seen today by CSW for follow up/assistance on Financial difficulties Health problems.   Social Determinants impacting successful heart failure regimen:  Housing: patient lives alone in a rooming house with limited cooking facilities. Food: Do you have enough food? No Patient reports challenges with storage and ability to cook as he only has a microwave. Do you know and understand healthy eating and how that affects your heart failure diagnosis? Yes  Do you follow a low salt diet?  Yes  Utilities: Do you have gas and/or electricity on in your home? Yes  Income: What is your source of income? Social Security Insurance: medicare/medicaid Transportation: Do you have transportation to your medical appointments?No  HF clinic provides bus passes    Daily Health Needs: Do you have a scale and weigh each day?  Yes  Are you able to adhere to your medication regimen? Yes  Do you ever take medications differently than prescribed? No  Do you know the zones of Heart Failure?  Yes  Do you know how to contact the HF Clinic appropriately with worsening symptoms or weight increases?  Yes   Do you have an Advanced Directive?  No  If not, would you like to complete?  Do you have any identified obstacles / challenges for adherence to current treatment plan?Yes  Patient reports challenges with housing, food, cooking facilities and transportation.  Patient appears to be coping well under current circumstances. Patient is on the waiting list for senior housing which will help reduce some of the social determinants that he is faced with in his medical follow up.    CSW assisted patient with [employer] and Supportive Counseling with the goal to Increase healthy  adjustment to current life circumstances and Increase adequate support systems for patient/family  Community Paramedicine Program and Heart Failure Clinic will continue to coordinate and monitor patient's treatment plan. CSW continues to be available for identified needs. Lasandra Beech, LCSW, CCSW-MCS [phone]

## 2017-10-04 NOTE — Addendum Note (Signed)
Encounter addended by: Marcy Siren, LCSW on: 10/04/2017  2:44 PM<BR>    Actions taken: Sign clinical note

## 2017-10-05 ENCOUNTER — Telehealth (HOSPITAL_COMMUNITY): Payer: Self-pay | Admitting: Cardiology

## 2017-10-05 DIAGNOSIS — I509 Heart failure, unspecified: Secondary | ICD-10-CM

## 2017-10-05 MED ORDER — POTASSIUM CHLORIDE CRYS ER 20 MEQ PO TBCR: 20.0000 meq | EXTENDED_RELEASE_TABLET | Freq: Every day | ORAL | 3 refills | Status: DC

## 2017-10-05 NOTE — Telephone Encounter (Signed)
3Patient aware. patient voiced understanding, repeat labs 10/30

## 2017-10-05 NOTE — Telephone Encounter (Signed)
-----   Message from Graciella Freer, PA-C sent at 10/03/2017  4:07 PM EDT ----- Please add Potassium 20 meq daily and recheck 10-14 days.     Casimiro Needle [address] [place], PA-C 10/03/2017 4:07 PM

## 2017-10-10 ENCOUNTER — Ambulatory Visit: Payer: Medicare Other | Attending: Family Medicine | Admitting: Family Medicine

## 2017-10-10 ENCOUNTER — Ambulatory Visit (HOSPITAL_COMMUNITY): Payer: Medicare Other | Attending: Cardiovascular Disease

## 2017-10-10 ENCOUNTER — Encounter: Payer: Self-pay | Admitting: Family Medicine

## 2017-10-10 ENCOUNTER — Ambulatory Visit: Payer: Self-pay | Admitting: Family Medicine

## 2017-10-10 ENCOUNTER — Other Ambulatory Visit: Payer: Self-pay

## 2017-10-10 VITALS — BP 99/64 | HR 100 | Temp 97.8°F | Ht 69.0 in | Wt 203.2 lb

## 2017-10-10 DIAGNOSIS — J449 Chronic obstructive pulmonary disease, unspecified: Secondary | ICD-10-CM | POA: Insufficient documentation

## 2017-10-10 DIAGNOSIS — I110 Hypertensive heart disease with heart failure: Secondary | ICD-10-CM | POA: Insufficient documentation

## 2017-10-10 DIAGNOSIS — I428 Other cardiomyopathies: Secondary | ICD-10-CM | POA: Insufficient documentation

## 2017-10-10 DIAGNOSIS — I5022 Chronic systolic (congestive) heart failure: Secondary | ICD-10-CM

## 2017-10-10 DIAGNOSIS — F1010 Alcohol abuse, uncomplicated: Secondary | ICD-10-CM | POA: Insufficient documentation

## 2017-10-10 DIAGNOSIS — F17200 Nicotine dependence, unspecified, uncomplicated: Secondary | ICD-10-CM | POA: Insufficient documentation

## 2017-10-10 DIAGNOSIS — Z7982 Long term (current) use of aspirin: Secondary | ICD-10-CM | POA: Insufficient documentation

## 2017-10-10 DIAGNOSIS — F319 Bipolar disorder, unspecified: Secondary | ICD-10-CM | POA: Insufficient documentation

## 2017-10-10 DIAGNOSIS — N644 Mastodynia: Secondary | ICD-10-CM | POA: Insufficient documentation

## 2017-10-10 DIAGNOSIS — N528 Other male erectile dysfunction: Secondary | ICD-10-CM

## 2017-10-10 DIAGNOSIS — R079 Chest pain, unspecified: Secondary | ICD-10-CM | POA: Diagnosis not present

## 2017-10-10 DIAGNOSIS — E785 Hyperlipidemia, unspecified: Secondary | ICD-10-CM | POA: Insufficient documentation

## 2017-10-10 DIAGNOSIS — F4310 Post-traumatic stress disorder, unspecified: Secondary | ICD-10-CM | POA: Insufficient documentation

## 2017-10-10 DIAGNOSIS — N529 Male erectile dysfunction, unspecified: Secondary | ICD-10-CM | POA: Diagnosis not present

## 2017-10-10 DIAGNOSIS — Z79899 Other long term (current) drug therapy: Secondary | ICD-10-CM | POA: Insufficient documentation

## 2017-10-10 DIAGNOSIS — B20 Human immunodeficiency virus [HIV] disease: Secondary | ICD-10-CM | POA: Insufficient documentation

## 2017-10-10 DIAGNOSIS — F419 Anxiety disorder, unspecified: Secondary | ICD-10-CM | POA: Insufficient documentation

## 2017-10-10 DIAGNOSIS — F3130 Bipolar disorder, current episode depressed, mild or moderate severity, unspecified: Secondary | ICD-10-CM | POA: Diagnosis not present

## 2017-10-10 DIAGNOSIS — Z21 Asymptomatic human immunodeficiency virus [HIV] infection status: Secondary | ICD-10-CM | POA: Diagnosis not present

## 2017-10-10 MED ORDER — SILDENAFIL CITRATE 50 MG PO TABS: 50.0000 mg | ORAL_TABLET | Freq: Every day | ORAL | 1 refills | Status: DC | PRN

## 2017-10-10 NOTE — Progress Notes (Signed)
Subjective:  Patient ID: Ryan Long, male    DOB: [birth year]  Age: 56 y.o. MRN: [id number]  CC: Chest Pain (knot on chest)   HPI Ryan Long is a 56 y.o. male with Medical history  significant for HIV ( undetectable viral load), COPD, CHF (EF 35-40% from 2-D echo of 08/2015), bipolar depression, hypertension who presents to the clinic complaining of left breast pain.  He had complained of the symptoms at his visit 3 months ago at which time it was thought to be secondary to spironolactone however his spironolactone has been switched to eplerenone and he still experiences the same pain. He is unable to palpate any lump and has no nipple discharge; he also denies fever.  He does have a history of bipolar disorder and endorses being depressed lately but denies suicidal ideation or intents. He was previously seen at behavioral health but informs me that 'doctor'  said he needed no medications at the time.  Denies shortness of breath, chest pain since his last visit to cardiology was last week.  Past Medical History:  Diagnosis Date  . Active smoker   . Alcohol abuse   . Anxiety   . AR (allergic rhinitis)   . Bipolar 1 disorder (HCC)   . CHF (congestive heart failure) (HCC)   . Chronic systolic heart failure (HCC)   . COPD (chronic obstructive pulmonary disease) (HCC)   . Crack cocaine use   . Depression   . Genital herpes   . HIV (human immunodeficiency virus infection) (HCC)   . HLD (hyperlipidemia)   . Hypertension   . NICM (nonischemic cardiomyopathy) (HCC)    Echocardiogram 06/28/11: EF 30-35%, mild MR, mild LAE;  No CAD by coronary CT angiogram 3/12 at [facility]  . NSVT (nonsustained ventricular tachycardia) (HCC)   . PTSD (post-traumatic stress disorder)     Past Surgical History:  Procedure Laterality Date  . NO PAST SURGERIES      No Known Allergies   Outpatient Medications Prior to Visit  Medication Sig Dispense Refill  . aspirin 81 MG  chewable tablet Chew 1 tablet (81 mg total) by mouth every morning. For heart health    . bictegravir-emtricitabine-tenofovir AF (BIKTARVY) 50-200-25 MG TABS tablet Take 1 tablet by mouth daily. 30 tablet 5  . DIGOX 125 MCG tablet TAKE 1 TABLET BY MOUTH DAILY( FOR CONTROL OF HEART ARRYTHMIA) 30 tablet 3  . eplerenone (INSPRA) 25 MG tablet Take 0.5 tablets (12.5 mg total) by mouth daily. 45 tablet 3  . furosemide (LASIX) 40 MG tablet Take 1 tablet (40 mg total) by mouth daily. 30 tablet 1  . hydrOXYzine (ATARAX/VISTARIL) 25 MG tablet Take 1-2 tablets (25-50 mg total) by mouth at bedtime. 30 tablet 3  . losartan (COZAAR) 50 MG tablet Take 1 tablet (50 mg total) by mouth daily. 30 tablet 3  . metoprolol succinate (TOPROL-XL) 50 MG 24 hr tablet Take 1 tablet (50 mg total) by mouth at bedtime. For high blood pressure 30 tablet 6  . Multiple Vitamins-Minerals (MULTIVITAMIN WITH MINERALS) tablet Take 1 tablet by mouth daily.    . potassium chloride SA (K-DUR,KLOR-CON) 20 MEQ tablet Take 1 tablet (20 mEq total) by mouth daily. 90 tablet 3  . valACYclovir (VALTREX) 500 MG tablet Take 1 tablet (500 mg total) by mouth daily. For Herpes. 30 tablet 1  . VENTOLIN HFA 108 (90 Base) MCG/ACT inhaler INHALE 2 PUFFS INTO THE LUNGS EVERY 6 HOURS AS NEEDED FOR WHEEZING OR SHORTNESS OF  BREATH 18 g 1  . Phenir-PE-Sod Sal-Caff Cit (COUGH/COLD MEDICINE PO) Take 1 tablet by mouth daily.     No facility-administered medications prior to visit.     ROS Review of Systems  Constitutional: Negative for activity change and appetite change.  HENT: Negative for sinus pressure and sore throat.   Eyes: Negative for visual disturbance.  Respiratory: Negative for cough, chest tightness, shortness of breath and wheezing.   Cardiovascular: Negative for chest pain, palpitations and leg swelling.       Left breast pain  Gastrointestinal: Negative for abdominal distention, abdominal pain, constipation and diarrhea.  Endocrine:  Negative.   Genitourinary: Negative.  Negative for dysuria.  Musculoskeletal: Negative.  Negative for joint swelling and myalgias.  Skin: Negative for rash.  Allergic/Immunologic: Negative.   Neurological: Negative for weakness, light-headedness and numbness.  Psychiatric/Behavioral: Negative for behavioral problems, dysphoric mood and suicidal ideas.    Objective:  BP 99/64   Pulse 100   Temp 97.8 F (36.6 C) (Oral)   Ht 5\' 9"  (1.753 m)   Wt 203 lb 3.2 oz (92.2 kg)   SpO2 97%   BMI 30.01 kg/m   BP/Weight 10/10/2017 10/03/2017 09/13/2017  Systolic BP 99 106 118  Diastolic BP 64 80 84  Wt. (Lbs) 203.2 202.4 -  BMI 30.01 29.89 -  Some encounter information is confidential and restricted. Go to Review Flowsheets activity to see all data.      Physical Exam  Constitutional: He is oriented to person, place, and time. He appears well-developed and well-nourished.  Cardiovascular: Normal rate, normal heart sounds and intact distal pulses.   No murmur heard. Pulmonary/Chest: Effort normal and breath sounds normal. He has no wheezes. He has no rales. He exhibits no tenderness. Right breast exhibits no mass and no tenderness. Left breast exhibits tenderness. Left breast exhibits no mass (slightly enlarged).  Abdominal: Soft. Bowel sounds are normal. He exhibits no distension and no mass. There is no tenderness.  Musculoskeletal: Normal range of motion.  Neurological: He is alert and oriented to person, place, and time.     Assessment & Plan:   1. Mastodynia of left breast Symptoms have persisted despite discontinuation of spironolactone - US BREAST LTD UNI LEFT INC AXILLA; Future - MM DIAG BREAST TOMO BILATERAL; Future  2. Bipolar 1 disorder (HCC) Not in crisis Scheduled with LCSW - Ambulatory referral to Psychiatry  3. Other male erectile dysfunction - sildenafil (VIAGRA) 50 MG tablet; Take 1 tablet (50 mg total) by mouth daily as needed for erectile dysfunction. At least  24 hours between doses  Dispense: 20 tablet; Refill: 1   Meds ordered this encounter  Medications  . sildenafil (VIAGRA) 50 MG tablet    Sig: Take 1 tablet (50 mg total) by mouth daily as needed for erectile dysfunction. At least 24 hours between doses    Dispense:  20 tablet    Refill:  1    Follow-up: Return in about 1 month (around 11/10/2017) for Follow-up of mastodynia; cancel previous appointment.   Jaclyn ShaggyEnobong Amao MD

## 2017-10-12 ENCOUNTER — Telehealth (HOSPITAL_COMMUNITY): Payer: Self-pay

## 2017-10-12 NOTE — Telephone Encounter (Signed)
I called Ryan Long today to follow up after he missed our appointment. He advised he forgot that we were supposed to meet and was not home. He stated he was feeling depressed and was no longer on an antidepressant. I requested he call his PCP and talk to them about an appointment regarding his depression. He told me he needed bus passes to make an appointment at the breast cancer center in [place] to have an x-ray and ultra sound done on a lump which has been causing him pain. I have gotten the passes from the clinic and will be delivering them to him by the end of the day.

## 2017-10-12 NOTE — Telephone Encounter (Signed)
Ryan Long was called to let Ryan inside of his house for our scheduled appointment. He did not answer multiple attempts to reach him.

## 2017-10-15 ENCOUNTER — Ambulatory Visit
Admission: RE | Admit: 2017-10-15 | Discharge: 2017-10-15 | Disposition: A | Discharge status: Home / Self Care | Payer: Medicare Other | Source: Ambulatory Visit | Attending: Family Medicine | Admitting: Family Medicine

## 2017-10-15 ENCOUNTER — Ambulatory Visit: Admission: RE | Admit: 2017-10-15 | Payer: Self-pay | Source: Ambulatory Visit

## 2017-10-15 DIAGNOSIS — R922 Inconclusive mammogram: Secondary | ICD-10-CM | POA: Diagnosis not present

## 2017-10-15 DIAGNOSIS — N644 Mastodynia: Secondary | ICD-10-CM

## 2017-10-16 ENCOUNTER — Inpatient Hospital Stay (HOSPITAL_COMMUNITY): Admission: RE | Admit: 2017-10-16 | Payer: Self-pay | Source: Ambulatory Visit

## 2017-10-16 ENCOUNTER — Ambulatory Visit: Payer: Self-pay

## 2017-10-19 ENCOUNTER — Other Ambulatory Visit (HOSPITAL_COMMUNITY): Payer: Self-pay | Admitting: *Deleted

## 2017-10-19 DIAGNOSIS — I5022 Chronic systolic (congestive) heart failure: Secondary | ICD-10-CM

## 2017-10-23 ENCOUNTER — Telehealth (HOSPITAL_COMMUNITY): Payer: Self-pay

## 2017-10-23 NOTE — Telephone Encounter (Signed)
Result Notes for ECHOCARDIOGRAM COMPLETE   Notes recorded by Chyrl Civatte, RN on 10/23/2017 at 9:48 AM EST LVMTCB, phone turned off/stright to VM ------  Notes recorded by Graciella Freer, PA-C on 10/22/2017 at 2:15 PM EST EF remains low. Need EP referral for ICD consideration.     Casimiro Needle [address]" [place], PA-C 10/22/2017 2:15 PM

## 2017-10-24 ENCOUNTER — Other Ambulatory Visit: Payer: Self-pay | Admitting: Infectious Disease

## 2017-10-24 ENCOUNTER — Telehealth (HOSPITAL_COMMUNITY): Payer: Self-pay

## 2017-10-24 ENCOUNTER — Encounter (HOSPITAL_COMMUNITY): Payer: Self-pay

## 2017-10-24 DIAGNOSIS — B20 Human immunodeficiency virus [HIV] disease: Secondary | ICD-10-CM

## 2017-10-24 NOTE — Telephone Encounter (Signed)
Result Notes for ECHOCARDIOGRAM COMPLETE   Notes recorded by Chyrl Civatte, RN on 10/24/2017 at 2:47 PM EST Phone still off, LVMTCB. Letter mailed to address listed in patient's chart ------  Notes recorded by Chyrl Civatte, RN on 10/23/2017 at 9:48 AM EST LVMTCB, phone turned off/stright to VM ------  Notes recorded by Graciella Freer, PA-C on 10/22/2017 at 2:15 PM EST EF remains low. Need EP referral for ICD consideration.     Casimiro Needle [address]" [place], PA-C 10/22/2017 2:15 PM

## 2017-10-24 NOTE — Telephone Encounter (Signed)
I called Ryan Long to schedule an appointment. He did not answer so I left a voicemail requesting he call me back.

## 2017-10-25 ENCOUNTER — Telehealth (HOSPITAL_COMMUNITY): Payer: Self-pay

## 2017-10-25 ENCOUNTER — Ambulatory Visit: Payer: Self-pay | Admitting: Family Medicine

## 2017-10-25 NOTE — Telephone Encounter (Signed)
I called Ryan Long to schedule an appointment today. He did not answer so I left a voicemail for him to call me back as soon as possible.

## 2017-10-25 NOTE — Telephone Encounter (Signed)
I called Ryan Long to schedule an appointment today. He did not answer so I left a voicemail requesting he call me back to schedule a visit.

## 2017-10-26 ENCOUNTER — Other Ambulatory Visit (HOSPITAL_COMMUNITY): Payer: Self-pay | Admitting: *Deleted

## 2017-10-26 DIAGNOSIS — I5022 Chronic systolic (congestive) heart failure: Secondary | ICD-10-CM

## 2017-10-31 ENCOUNTER — Telehealth (HOSPITAL_COMMUNITY): Payer: Self-pay

## 2017-10-31 NOTE — Telephone Encounter (Signed)
I called Mr Ryan Long today to schedule an appointment. He did not answer so I left a voicemail for him to call me back.

## 2017-11-02 ENCOUNTER — Other Ambulatory Visit (HOSPITAL_COMMUNITY): Payer: Self-pay

## 2017-11-02 NOTE — Progress Notes (Signed)
Paramedicine Encounter    Patient ID: Ryan Long, male    DOB: 09-28-1961, 56 y.o.   MRN: [id number]   Patient Care Team: Jaclyn Shaggy, MD as PCP - General (Family Medicine)  Patient Active Problem List   Diagnosis Date Noted  . Bipolar 1 disorder (HCC) 10/10/2017  . Breast lump 10/03/2017  . Insomnia 07/16/2017  . Screening examination for venereal disease 04/18/2016  . Encounter for long-term (current) use of medications 04/18/2016  . Major depressive disorder, recurrent (HCC) 10/08/2015  . COPD (chronic obstructive pulmonary disease) (HCC) 10/04/2015  . Abnormal thyroid stimulating hormone (TSH) level 10/04/2015  . Severe episode of recurrent major depressive disorder, without psychotic features (HCC)   . MDD (major depressive disorder), recurrent episode, severe (HCC) 09/23/2015  . HTN (hypertension) 09/10/2015  . MDD (major depressive disorder), recurrent, severe, with psychosis (HCC) 08/07/2015  . PTSD (post-traumatic stress disorder) 08/07/2015  . Cocaine use disorder, severe, dependence (HCC) 08/07/2015  . Alcohol use disorder, severe, dependence (HCC) 08/07/2015  . Tobacco use disorder 08/07/2015  . Suicidal ideation   . Polysubstance (excluding opioids) dependence with physiological dependence (HCC) 12/30/2013  . Substance induced mood disorder (HCC) 11/22/2013  . Cocaine abuse, episodic use (HCC) 03/20/2012    Class: Acute  . Alcohol dependence (HCC) 03/20/2012    Class: Acute  . Chronic systolic heart failure (HCC)   . Cardiomyopathy, nonischemic (HCC) 08/24/2011  . Human immunodeficiency virus (HIV) disease (HCC) 02/01/2009  . Recurrent HSV (herpes simplex virus) 02/01/2009  . HYPERLIPIDEMIA 02/01/2009  . ALLERGIC RHINITIS 02/01/2009    Current Outpatient Medications:  .  aspirin 81 MG chewable tablet, Chew 1 tablet (81 mg total) by mouth every morning. For heart health, Disp: , Rfl:  .  bictegravir-emtricitabine-tenofovir AF (BIKTARVY) 50-200-25 MG TABS  tablet, Take 1 tablet by mouth daily., Disp: 30 tablet, Rfl: 5 .  DIGOX 125 MCG tablet, TAKE 1 TABLET BY MOUTH DAILY( FOR CONTROL OF HEART ARRYTHMIA), Disp: 30 tablet, Rfl: 3 .  eplerenone (INSPRA) 25 MG tablet, Take 0.5 tablets (12.5 mg total) by mouth daily., Disp: 45 tablet, Rfl: 3 .  furosemide (LASIX) 40 MG tablet, Take 1 tablet (40 mg total) by mouth daily., Disp: 30 tablet, Rfl: 1 .  hydrOXYzine (ATARAX/VISTARIL) 25 MG tablet, Take 1-2 tablets (25-50 mg total) by mouth at bedtime., Disp: 30 tablet, Rfl: 3 .  losartan (COZAAR) 50 MG tablet, Take 1 tablet (50 mg total) by mouth daily., Disp: 30 tablet, Rfl: 3 .  metoprolol succinate (TOPROL-XL) 50 MG 24 hr tablet, Take 1 tablet (50 mg total) by mouth at bedtime. For high blood pressure, Disp: 30 tablet, Rfl: 6 .  Multiple Vitamins-Minerals (MULTIVITAMIN WITH MINERALS) tablet, Take 1 tablet by mouth daily., Disp: , Rfl:  .  potassium chloride SA (K-DUR,KLOR-CON) 20 MEQ tablet, Take 1 tablet (20 mEq total) by mouth daily., Disp: 90 tablet, Rfl: 3 .  sildenafil (VIAGRA) 50 MG tablet, Take 1 tablet (50 mg total) by mouth daily as needed for erectile dysfunction. At least 24 hours between doses, Disp: 20 tablet, Rfl: 1 .  valACYclovir (VALTREX) 500 MG tablet, Take 1 tablet (500 mg total) by mouth daily. For Herpes., Disp: 30 tablet, Rfl: 1 .  VENTOLIN HFA 108 (90 Base) MCG/ACT inhaler, INHALE 2 PUFFS INTO THE LUNGS EVERY 6 HOURS AS NEEDED FOR WHEEZING OR SHORTNESS OF BREATH, Disp: 18 g, Rfl: 1 .  Phenir-PE-Sod Sal-Caff Cit (COUGH/COLD MEDICINE PO), Take 1 tablet by mouth daily., Disp: , Rfl:  No  Known Allergies    Social History   Socioeconomic History  . Marital status: Single    Spouse name: Not on file  . Number of children: Not on file  . Years of education: Not on file  . Highest education level: Not on file  Social Needs  . Financial resource strain: Not on file  . Food insecurity - worry: Not on file  . Food insecurity - inability:  Not on file  . Transportation needs - medical: Not on file  . Transportation needs - non-medical: Not on file  Occupational History  . Not on file  Tobacco Use  . Smoking status: Current Every Day Smoker    Packs/day: 0.75    Years: 30.00    Pack years: 22.50    Types: Cigarettes  . Smokeless tobacco: Never Used  . Tobacco comment: 15 cigarettes daily  Substance and Sexual Activity  . Alcohol use: Yes    Alcohol/week: 3.6 oz    Types: 6 Cans of beer per week    Comment: reports drinks 6+ beers 3 times a month  . Drug use: Yes    Types: Cocaine    Comment: reports uses crack cocaine 3 times a month  . Sexual activity: Yes    Birth control/protection: Condom    Comment: pt. given condoms  Other Topics Concern  . Not on file  Social History Narrative  . Not on file    Physical Exam  Constitutional: He is oriented to person, place, and time.  Cardiovascular: Normal rate and regular rhythm.  Pulmonary/Chest: Effort normal. No respiratory distress. He has wheezes. He has no rales.  Abdominal: Soft. He exhibits no distension. There is no tenderness.  Musculoskeletal: Normal range of motion. He exhibits no edema.  Neurological: He is alert and oriented to person, place, and time.  Skin: Skin is warm and dry.  Psychiatric: He has a normal mood and affect.    SAFE - 09/13/17 1200      Situation   Admitting diagnosis  chf    Heart failure history  Exisiting    Comorbidities  COPD;HTN    Readmitted within 30 days  No    Hospital admission within past 12 months  Yes    number of hospital admissions  2    number of ED visits  2      Assessment   Lives alone  No    Primary support person  None    Mode of transportation  public bus    Other services involved  None    Home equipement  Scale      Weight   Weighs self daily  No    Scale provided  Yes    Records on weight chart  No      Resources   Has "Living better w/heart failure" book  No    Has HF Zone tool  No     Able to identify yellow zone signs/when to call MD  No    Records zone daily  No      Medications   Uses a pill box  Yes    Who stocks the pill box  self    Pill box checked this visit  Yes    Pill box refilled this visit  No    Difficulty obtaining medications  No    Mail order medications  No    Missed one or more doses of medications per week  No  Nutrition   Patient receives meals on wheels  No    Patient follows low sodium diet  Yes    Has foods at home that meet the current recommended diet  Yes    Patient follows low sugar/card diet  No    Nutritional concerns/issues  Financial       Activity Level   ADL's/Mobility  Independent    How many feet can patient ambulate  2-3 blocks    Typical activity level  Inactive      Urine   Difficulty urinating  No    Changes in urine  None      Time spent with patient   Time spent with patient   60 Minutes         Future Appointments  Date Time Provider Department Center  11/12/2017  1:45 PM Jaclyn Shaggy, MD [facility] None  09/02/2018  9:45 AM [facility]  09/16/2018  9:45 AM Randall Hiss, MD [facility]    BP (!) 148/86 (BP Location: Left Arm, Patient Position: Sitting, Cuff Size: Normal)   Pulse 86   Resp 18   Wt 205 lb (93 kg)   SpO2 98%   BMI 30.27 kg/m   Weight yesterday- Did not weigh Last visit weight- 202.4 lb  Mr Tarrence was seen at home today. He reported multiple episodes of feeling dizzy/light headed over the last couple of weeks. He though it was related to his dose of metoprolol so he decreased to 25 mg without consulting the clinic or myself. He also stated he has been drinking over the 2 liter per day limit. I gave him some education regarding his fluid intake and how he could keep track of how much he is drinking. He also stated he missed several doses of furosemide but did not know how many. He said he doesn't like having to go to the bathroom all the time which is why he  doesn't always take it. His medications were verified and his pillbox was refilled. He has not been weighing himself either because he has carpet in his bedroom and forgets to carry the scale to the bathroom downstairs.   Time spent with patient: 30 minutes  Jacqualine Code, EMT 11/02/17  ACTION: Home visit completed Next visit planned for 2 weeks

## 2017-11-07 ENCOUNTER — Telehealth (HOSPITAL_COMMUNITY): Payer: Self-pay

## 2017-11-07 NOTE — Telephone Encounter (Signed)
Ryan Long called me today to ask if I would bring him bus passes from the clinic to our meeting today. He said he has a PCP appointment that he would not be able make without them. I told him I would bring them to our appointment this afternoon and he agreed.

## 2017-11-07 NOTE — Telephone Encounter (Signed)
I called Ryan Long to schedule an appointment. He said he would be unable to meet this afternoon but said he could meet at 15:00 tomorrow so an appointment was set.

## 2017-11-07 NOTE — Telephone Encounter (Signed)
I called to let Ryan Long know I was outside of his house as is normal per our previous encounters. He did not answer his phone and since he lives in boarding house I was unable to go in and knock on his private door. I left a voicemail stating that I was there and to call me back or come back outside.

## 2017-11-07 NOTE — Telephone Encounter (Signed)
I called Ryan Long to confirm our appointment today and see if he would be able to meet earlier that we planned since I had a cancellation. He did not answer the phone and I did not leave a voicemail.

## 2017-11-07 NOTE — Telephone Encounter (Signed)
I called Ryan Long again to see if he was home. He did not answer so I left another voicemail stating that I was leaving and would try to reach him next week.

## 2017-11-12 ENCOUNTER — Ambulatory Visit: Payer: Self-pay | Admitting: Family Medicine

## 2017-11-16 ENCOUNTER — Telehealth (HOSPITAL_COMMUNITY): Payer: Self-pay

## 2017-11-16 NOTE — Telephone Encounter (Signed)
I called Ryan Long to see if he could meet today since I had not received a call back after several attempts. He did not answer so I left a voicemail with my information and requested he call me back.

## 2017-11-20 ENCOUNTER — Institutional Professional Consult (permissible substitution): Payer: Medicare Other | Admitting: Internal Medicine

## 2017-11-21 DIAGNOSIS — R45851 Suicidal ideations: Secondary | ICD-10-CM | POA: Diagnosis not present

## 2017-11-21 DIAGNOSIS — R5383 Other fatigue: Secondary | ICD-10-CM | POA: Diagnosis not present

## 2017-11-21 DIAGNOSIS — R0602 Shortness of breath: Secondary | ICD-10-CM | POA: Diagnosis not present

## 2017-11-21 DIAGNOSIS — B20 Human immunodeficiency virus [HIV] disease: Secondary | ICD-10-CM | POA: Diagnosis not present

## 2017-11-22 ENCOUNTER — Encounter: Payer: Self-pay | Admitting: Internal Medicine

## 2017-11-26 DIAGNOSIS — F17210 Nicotine dependence, cigarettes, uncomplicated: Secondary | ICD-10-CM | POA: Diagnosis not present

## 2017-11-26 DIAGNOSIS — R45851 Suicidal ideations: Secondary | ICD-10-CM | POA: Diagnosis not present

## 2017-11-26 DIAGNOSIS — B20 Human immunodeficiency virus [HIV] disease: Secondary | ICD-10-CM | POA: Diagnosis not present

## 2017-11-26 DIAGNOSIS — Z046 Encounter for general psychiatric examination, requested by authority: Secondary | ICD-10-CM | POA: Diagnosis not present

## 2017-11-26 DIAGNOSIS — Z7982 Long term (current) use of aspirin: Secondary | ICD-10-CM | POA: Diagnosis not present

## 2017-11-26 DIAGNOSIS — R079 Chest pain, unspecified: Secondary | ICD-10-CM | POA: Diagnosis not present

## 2017-11-26 DIAGNOSIS — I110 Hypertensive heart disease with heart failure: Secondary | ICD-10-CM | POA: Diagnosis not present

## 2017-11-26 DIAGNOSIS — R002 Palpitations: Secondary | ICD-10-CM | POA: Diagnosis not present

## 2017-11-26 DIAGNOSIS — R69 Illness, unspecified: Secondary | ICD-10-CM | POA: Diagnosis not present

## 2017-11-26 DIAGNOSIS — I509 Heart failure, unspecified: Secondary | ICD-10-CM | POA: Diagnosis not present

## 2017-11-29 DIAGNOSIS — R45851 Suicidal ideations: Secondary | ICD-10-CM | POA: Diagnosis present

## 2017-11-29 DIAGNOSIS — F17210 Nicotine dependence, cigarettes, uncomplicated: Secondary | ICD-10-CM | POA: Diagnosis present

## 2017-11-29 DIAGNOSIS — I10 Essential (primary) hypertension: Secondary | ICD-10-CM | POA: Diagnosis present

## 2017-11-29 DIAGNOSIS — N342 Other urethritis: Secondary | ICD-10-CM | POA: Diagnosis present

## 2017-11-29 DIAGNOSIS — I509 Heart failure, unspecified: Secondary | ICD-10-CM | POA: Diagnosis present

## 2017-11-29 DIAGNOSIS — F332 Major depressive disorder, recurrent severe without psychotic features: Secondary | ICD-10-CM | POA: Diagnosis not present

## 2017-11-29 DIAGNOSIS — Z590 Homelessness: Secondary | ICD-10-CM | POA: Diagnosis not present

## 2017-11-29 DIAGNOSIS — R002 Palpitations: Secondary | ICD-10-CM | POA: Diagnosis not present

## 2017-11-29 DIAGNOSIS — B20 Human immunodeficiency virus [HIV] disease: Secondary | ICD-10-CM | POA: Diagnosis not present

## 2017-11-29 DIAGNOSIS — I110 Hypertensive heart disease with heart failure: Secondary | ICD-10-CM | POA: Diagnosis not present

## 2017-11-29 DIAGNOSIS — Z21 Asymptomatic human immunodeficiency virus [HIV] infection status: Secondary | ICD-10-CM | POA: Diagnosis present

## 2017-12-04 ENCOUNTER — Telehealth: Payer: Self-pay | Admitting: Licensed Clinical Social Worker

## 2017-12-05 ENCOUNTER — Telehealth (HOSPITAL_COMMUNITY): Payer: Self-pay

## 2017-12-05 NOTE — Telephone Encounter (Signed)
Patient called to say he is currently admitted at a hospital in [place] and does not anticipate returning to [facility]. Patient will follow up with SW at current facility regarding housing options and reach out to CSW if needed. [place] paramedicine [place]. CSW available as needed. Lasandra Beech, LCSW, CCSW-MCS [phone]

## 2017-12-06 ENCOUNTER — Telehealth (HOSPITAL_COMMUNITY): Payer: Self-pay

## 2017-12-06 NOTE — Telephone Encounter (Signed)
Mr Helmes called me today and was very apologetic for not returning my phone calls in the last couple of weeks. He stated he had been depressed and as a result, stopped taking care of himself and ended up in the hospital. He stated that he was being discharged from the behavioral health hospital in [place] and was being taken to [facility] in [place], [place]. He said this is a housing center for homeless people but did not know why he was being sent so far from his doctors. His reason for calling today was to ask if I could help him find housing in [place] so that he "will not have to start over with new doctors in [place]." He said he would be able to look himself once he got to [place] but wanted help. I told him I would look at options online and he was agreeable. I will contact him after the holiday to see if I have found any new options for him.

## 2017-12-06 NOTE — Telephone Encounter (Signed)
I called Ryan Long after multiple weeks of not hearing from him. I had received word that he was in the hospital in [place] so I assumed this was why he had not called. His phone did not ring so I left a voicemail asking he call me back when he was able.

## 2017-12-13 ENCOUNTER — Other Ambulatory Visit (HOSPITAL_COMMUNITY): Payer: Self-pay | Admitting: Adult Health

## 2017-12-13 ENCOUNTER — Institutional Professional Consult (permissible substitution): Payer: Medicare Other | Admitting: Internal Medicine

## 2017-12-13 DIAGNOSIS — L03115 Cellulitis of right lower limb: Secondary | ICD-10-CM | POA: Diagnosis not present

## 2017-12-13 DIAGNOSIS — Z7982 Long term (current) use of aspirin: Secondary | ICD-10-CM | POA: Diagnosis not present

## 2017-12-13 DIAGNOSIS — I159 Secondary hypertension, unspecified: Secondary | ICD-10-CM

## 2017-12-13 DIAGNOSIS — I110 Hypertensive heart disease with heart failure: Secondary | ICD-10-CM | POA: Diagnosis not present

## 2017-12-13 DIAGNOSIS — I509 Heart failure, unspecified: Secondary | ICD-10-CM | POA: Diagnosis not present

## 2017-12-13 DIAGNOSIS — Z21 Asymptomatic human immunodeficiency virus [HIV] infection status: Secondary | ICD-10-CM | POA: Diagnosis not present

## 2017-12-13 DIAGNOSIS — B029 Zoster without complications: Secondary | ICD-10-CM | POA: Diagnosis not present

## 2017-12-13 DIAGNOSIS — F17210 Nicotine dependence, cigarettes, uncomplicated: Secondary | ICD-10-CM | POA: Diagnosis not present

## 2017-12-19 ENCOUNTER — Institutional Professional Consult (permissible substitution): Payer: Medicare Other | Admitting: Internal Medicine

## 2017-12-20 ENCOUNTER — Encounter: Payer: Self-pay | Admitting: *Deleted

## 2017-12-20 ENCOUNTER — Institutional Professional Consult (permissible substitution): Payer: Medicare Other | Admitting: Internal Medicine

## 2017-12-20 DIAGNOSIS — F419 Anxiety disorder, unspecified: Secondary | ICD-10-CM | POA: Insufficient documentation

## 2017-12-25 ENCOUNTER — Encounter: Payer: Self-pay | Admitting: Internal Medicine

## 2017-12-25 DIAGNOSIS — R45851 Suicidal ideations: Secondary | ICD-10-CM | POA: Insufficient documentation

## 2017-12-25 DIAGNOSIS — D72819 Decreased white blood cell count, unspecified: Secondary | ICD-10-CM | POA: Diagnosis not present

## 2017-12-25 DIAGNOSIS — Z21 Asymptomatic human immunodeficiency virus [HIV] infection status: Secondary | ICD-10-CM | POA: Diagnosis not present

## 2017-12-26 DIAGNOSIS — F4323 Adjustment disorder with mixed anxiety and depressed mood: Secondary | ICD-10-CM | POA: Diagnosis not present

## 2017-12-26 DIAGNOSIS — F4312 Post-traumatic stress disorder, chronic: Secondary | ICD-10-CM | POA: Diagnosis not present

## 2017-12-26 DIAGNOSIS — R45851 Suicidal ideations: Secondary | ICD-10-CM | POA: Diagnosis not present

## 2017-12-27 DIAGNOSIS — R45851 Suicidal ideations: Secondary | ICD-10-CM | POA: Diagnosis not present

## 2018-01-01 ENCOUNTER — Ambulatory Visit: Payer: Medicare Other | Admitting: Family Medicine

## 2018-01-03 ENCOUNTER — Encounter: Payer: Self-pay | Admitting: Cardiology

## 2018-01-03 ENCOUNTER — Ambulatory Visit (INDEPENDENT_AMBULATORY_CARE_PROVIDER_SITE_OTHER): Payer: Medicare Other | Admitting: Cardiology

## 2018-01-03 ENCOUNTER — Other Ambulatory Visit: Payer: Self-pay | Admitting: Cardiology

## 2018-01-03 ENCOUNTER — Encounter: Payer: Self-pay | Admitting: *Deleted

## 2018-01-03 VITALS — BP 124/60 | HR 102 | Ht 69.0 in | Wt 205.0 lb

## 2018-01-03 DIAGNOSIS — Z01812 Encounter for preprocedural laboratory examination: Secondary | ICD-10-CM

## 2018-01-03 DIAGNOSIS — Z720 Tobacco use: Secondary | ICD-10-CM | POA: Diagnosis not present

## 2018-01-03 DIAGNOSIS — I428 Other cardiomyopathies: Secondary | ICD-10-CM

## 2018-01-03 NOTE — Patient Instructions (Signed)
Medication Instructions:  Your physician recommends that you continue on your current medications as directed. Please refer to the Current Medication list given to you today.     * If you need a refill on your cardiac medications before your next appointment, please call your pharmacy. *  Labwork: Pre procedure lab work today: BMET & CBC w/ diff  Testing/Procedures: Your physician has recommended that you have a defibrillator inserted. An implantable cardioverter defibrillator (ICD) is a small device that is placed in your chest or, in rare cases, your abdomen. This device uses electrical pulses or shocks to help control life-threatening, irregular heartbeats that could lead the heart to suddenly stop beating (sudden cardiac arrest). Leads are attached to the ICD that goes into your heart. This is done in the hospital and usually requires an overnight stay. Please see the instruction sheet given to you today for more information.  Follow-Up: Your physician recommends that you schedule a wound check appointment 10-14 days, after your procedure on 01/08/2018, with the device clinic.  Your physician recommends that you schedule a follow up appointment in 91 days, after your procedure on 01/08/2018, with Dr. Elberta Fortis.  Thank you for choosing [facility]!!   Dory Horn, RN [phone]  Any Other Special Instructions Will Be Listed Below (If Applicable).   Cardioverter Defibrillator Implantation An implantable cardioverter defibrillator (ICD) is a small, lightweight, battery-powered device that is placed (implanted) under the skin in the chest or abdomen. Your caregiver may prescribe an ICD if:  You have had an irregular heart rhythm (arrhythmia) that originated in the lower chambers of the heart (ventricles).  Your heart has been damaged by a disease (such as coronary artery disease) or heart condition (such as a heart attack). An ICD consists of a battery that lasts several years, a  small computer called a pulse generator, and wires called leads that go into the heart. It is used to detect and correct two dangerous arrhythmias: a rapid heart rhythm (tachycardia) and an arrhythmia in which the ventricles contract in an uncoordinated way (fibrillation). When an ICD detects tachycardia, it sends an electrical signal to the heart that restores the heartbeat to normal (cardioversion). This signal is usually painless. If cardioversion does not work or if the ICD detects fibrillation, it delivers a small electrical shock to the heart (defibrillation) to restart the heart. The shock may feel like a strong jolt in the chest.ICDs may be programmed to correct other problems. Sometimes, ICDs are programmed to act as another type of implantable device called a pacemaker. Pacemakers are used to treat a slow heartbeat (bradycardia). LET YOUR CAREGIVER KNOW ABOUT:  Any allergies you have.  All medicines you are taking, including vitamins, herbs, eyedrops, and over-the-counter medicines and creams.  Previous problems you or members of your family have had with the use of anesthetics.  Any blood disorders you have had.  Other health problems you have. RISKS AND COMPLICATIONS Generally, the procedure to implant an ICD is safe. However, as with any surgical procedure, complications can occur. Possible complications associated with implanting an ICD include:  Swelling, bleeding, or bruising at the site where the ICD was implanted.  Infection at the site where the ICD was implanted.  A reaction to medicine used during the procedure.  Nerve, heart, or blood vessel damage.  Blood clots. BEFORE THE PROCEDURE  You may need to have blood tests, heart tests, or a chest X-ray done before the day of the procedure.  Ask your caregiver  about changing or stopping your regular medicines.  Make plans to have someone drive you home. You may need to stay in the hospital overnight after the  procedure.  Stop smoking at least 24 hours before the procedure.  Take a bath or shower the night before the procedure. You may need to scrub your chest or abdomen with a special type of soap.  Do not eat or drink before your procedure for as long as directed by your caregiver. Ask if it is okay to take any needed medicine with a small sip of water. PROCEDURE  The procedure to implant an ICD in your chest or abdomen is usually done at a hospital in a room that has a large X-ray machine called a fluoroscope. The machine will be above you during the procedure. It will help your caregiver see your heart during the procedure. Implanting an ICD usually takes 1-3 hours. Before the procedure:   Small monitors will be put on your body. They will be used to check your heart, blood pressure, and oxygen level.  A needle will be put into a vein in your hand or arm. This is called an intravenous (IV) access tube. Fluids and medicine will flow directly into your body through the IV tube.  Your chest or abdomen will be cleaned with a germ-killing (antiseptic) solution. The area may be shaved.  You may be given medicine to help you relax (sedative).  You will be given a medicine called a local anesthetic. This medicine will make the surgical site numb while the ICD is implanted. You will be sleepy but awake during the procedure. After you are numb the procedure will begin. The caregiver will:  Make a small cut (incision). This will make a pocket deep under your skin that will hold the pulse generator.  Guide the leads through a large blood vessel into your heart and attach them to the heart muscles. Depending on the ICD, the leads may go into one ventricle or they may go to both ventricles and into an upper chamber of the heart (atrium).  Test the ICD.  Close the incision with stitches, glue, or staples. AFTER THE PROCEDURE  You may feel pain. Some pain is normal. It may last a few days.  You may  stay in a recovery area until the local anesthetic has worn off. Your blood pressure and pulse will be checked often. You will be taken to a room where your heart will be monitored.  A chest X-ray will be taken. This is done to check that the cardioverter defibrillator is in the right place.  You may stay in the hospital overnight.  A slight bump may be seen over the skin where the ICD was placed. Sometimes, it is possible to feel the ICD under the skin. This is normal.  In the months and years afterward, your caregiver will check the device, the leads, and the battery every few months. Eventually, when the battery is low, the ICD will be replaced.   This information is not intended to replace advice given to you by your health care provider. Make sure you discuss any questions you have with your health care provider.   Document Released: 08/26/2002 Document Revised: 09/24/2013 Document Reviewed: 12/23/2012 Elsevier Interactive Patient Education [employer]

## 2018-01-03 NOTE — Progress Notes (Signed)
Electrophysiology Office Note   Date:  01/03/2018   ID:  Ryan Long, DOB [birth year], MRN [id number]  PCP:  Ryan Shaggy, MD  Cardiologist:  Ryan Long Primary Electrophysiologist:  Ryan Culton [name], MD    Chief Complaint  Patient presents with  . Advice Only    Nonischemic cardiomyopathy/Chronic systolic CHF/Discuss ICD     History of Present Illness: Ryan Long is a 57 y.o. male who is being seen today for the evaluation of CHF at the request of Ryan Long. Presenting today for electrophysiology evaluation. He has a history of tobacco abuse, alcohol abuse, bipolar 1, nonischemic cardiomyopathy, COPD, HIV, hyperlipidemia, hypertension.  His nonischemic cardiomyopathy was not felt due to HIV due to good CD4 counts.  He is no longer using cocaine.  He is try to cut back on smoking.  He is currently living in a shelter in [place].   Today, he denies symptoms of palpitations, chest pain, shortness of breath, orthopnea, PND, lower extremity edema, claudication, dizziness, presyncope, syncope, bleeding, or neurologic sequela. The patient is tolerating medications without difficulties.  He does have some mild fatigue and shortness of breath.  He has been feeling well otherwise.  No major complaints today.  Past Medical History:  Diagnosis Date  . Active smoker   . Alcohol abuse   . Anxiety   . AR (allergic rhinitis)   . Bipolar 1 disorder (HCC)   . CHF (congestive heart failure) (HCC)   . Chronic systolic heart failure (HCC)   . COPD (chronic obstructive pulmonary disease) (HCC)   . Crack cocaine use   . Depression   . Genital herpes   . HIV (human immunodeficiency virus infection) (HCC)   . HLD (hyperlipidemia)   . Hypertension   . NICM (nonischemic cardiomyopathy) (HCC)    Echocardiogram 06/28/11: EF 30-35%, mild MR, mild LAE;  No CAD by coronary CT angiogram 3/12 at [facility]  . NSVT (nonsustained ventricular tachycardia) (HCC)   . PTSD  (post-traumatic stress disorder)    Past Surgical History:  Procedure Laterality Date  . NO PAST SURGERIES       Current Outpatient Medications  Medication Sig Dispense Refill  . aspirin 81 MG chewable tablet Chew 1 tablet (81 mg total) by mouth every morning. For heart health    . bictegravir-emtricitabine-tenofovir AF (BIKTARVY) 50-200-25 MG TABS tablet Take 1 tablet by mouth daily. 30 tablet 5  . DIGOX 125 MCG tablet TAKE 1 TABLET BY MOUTH DAILY( FOR CONTROL OF HEART ARRYTHMIA) 30 tablet 3  . eplerenone (INSPRA) 25 MG tablet Take 0.5 tablets (12.5 mg total) by mouth daily. 45 tablet 3  . furosemide (LASIX) 40 MG tablet Take 1 tablet (40 mg total) by mouth daily. 30 tablet 1  . hydrOXYzine (ATARAX/VISTARIL) 25 MG tablet Take 1-2 tablets (25-50 mg total) by mouth at bedtime. 30 tablet 3  . losartan (COZAAR) 50 MG tablet TAKE 1 TABLET(50 MG) BY MOUTH DAILY 90 tablet 0  . metoprolol succinate (TOPROL-XL) 50 MG 24 hr tablet Take 1 tablet (50 mg total) by mouth at bedtime. For high blood pressure 30 tablet 6  . Multiple Vitamins-Minerals (MULTIVITAMIN WITH MINERALS) tablet Take 1 tablet by mouth daily.    . potassium chloride SA (K-DUR,KLOR-CON) 20 MEQ tablet Take 1 tablet (20 mEq total) by mouth daily. 90 tablet 3  . sildenafil (VIAGRA) 50 MG tablet Take 1 tablet (50 mg total) by mouth daily as needed for erectile dysfunction. At least 24 hours between doses 20  tablet 1  . valACYclovir (VALTREX) 500 MG tablet Take 1 tablet (500 mg total) by mouth daily. For Herpes. 30 tablet 1  . VENTOLIN HFA 108 (90 Base) MCG/ACT inhaler INHALE 2 PUFFS INTO THE LUNGS EVERY 6 HOURS AS NEEDED FOR WHEEZING OR SHORTNESS OF BREATH 18 g 1   No current facility-administered medications for this visit.     Allergies:   Patient has no known allergies.   Social History:  The patient  reports that he has been smoking cigarettes.  He has a 22.50 pack-year smoking history. he has never used smokeless tobacco. He  reports that he drinks about 3.6 oz of alcohol per week. He reports that he uses drugs. Drug: Cocaine.   Family History:  The patient's family history includes CAD in his father; Hypertension in his father.    ROS:  Please see the history of present illness.   Otherwise, review of systems is positive for fatigue, palpitations, shortness, wheezing, back pain,.   All other systems are reviewed and negative.    PHYSICAL EXAM: VS:  BP 124/60   Pulse (!) 102   Ht 5\' 9"  (1.753 m)   Wt 205 lb (93 kg)   BMI 30.27 kg/m  , BMI Body mass index is 30.27 kg/m. GEN: Well nourished, well developed, in no acute distress  HEENT: normal  Neck: no JVD, carotid bruits, or masses Cardiac: RRR; no murmurs, rubs, or gallops,no edema  Respiratory:  clear to auscultation bilaterally, normal work of breathing GI: soft, nontender, nondistended, + BS MS: no deformity or atrophy  Skin: warm and dry Neuro:  Strength and sensation are intact Psych: euthymic mood, full affect  EKG:  EKG is ordered today. Personal review of the ekg ordered shows sinus rhythm, rate 102, PVCs, septal Q waves  Recent Labs: 08/28/2017: ALT 22 10/03/2017: BUN 11; Creatinine, Ser 1.34; Hemoglobin 18.2; Platelets 176; Potassium 3.4; Sodium 137    Lipid Panel     Component Value Date/Time   CHOL 150 04/18/2016 1622   TRIG 193 (H) 04/18/2016 1622   HDL 61 04/18/2016 1622   CHOLHDL 2.5 04/18/2016 1622   VLDL 39 (H) 04/18/2016 1622   LDLCALC 50 04/18/2016 1622     Wt Readings from Last 3 Encounters:  01/03/18 205 lb (93 kg)  11/02/17 205 lb (93 kg)  10/10/17 203 lb 3.2 oz (92.2 kg)      Other studies Reviewed: Additional studies/ records that were reviewed today include: TTE 10/10/17  Review of the above records today demonstrates:  - Left ventricle: The cavity size was normal. Wall thickness was   normal. Systolic function was severely reduced. The estimated   ejection fraction was in the range of 25% to 30%.  Diffuse   hypokinesis. Doppler parameters are consistent with abnormal left   ventricular relaxation (grade 1 diastolic dysfunction).   ASSESSMENT AND PLAN:  1.  Chronic systolic heart failure due to nonischemic cardiomyopathy: Ejection fraction remains below 35%, he thus qualifies for ICD implant.  Currently on optimal medical therapy with Toprol-XL, losartan, and eplerenone.  He has been doing well although he does have some symptoms of fatigue and weakness as well as some shortness of breath.  I discussed with him the possibility of ICD implantation.  Risks and benefits include bleeding, infection, tamponade, pneumothorax, death, stroke, MI.  Also included are inappropriate shocks.  He understands these risks and is agreed to the procedure.  2.  HIV: Follow-up with Dr. Algis Liming.  No changes.  3.  Tobacco abuse: Encouraged complete cessation  4.  Cocaine abuse: Congratulated on continued abstinence.  Current medicines are reviewed at length with the patient today.   The patient does not have concerns regarding his medicines.  The following changes were made today:  none  Labs/ tests ordered today include:  Orders Placed This Encounter  Procedures  . EKG 12-Lead     Disposition:   FU with Shaleta Ruacho 3 months  Signed, Isaac Lacson [name], MD  01/03/2018 2:44 PM     [facility] [address] [place] [address] [phone] (office) [phone] (fax)

## 2018-01-03 NOTE — Addendum Note (Signed)
Addended by: Baird Lyons on: 01/03/2018 03:17 PM   Modules accepted: Orders

## 2018-01-04 ENCOUNTER — Telehealth: Payer: Self-pay | Admitting: Cardiology

## 2018-01-04 ENCOUNTER — Other Ambulatory Visit (HOSPITAL_COMMUNITY): Payer: Self-pay | Admitting: Adult Health

## 2018-01-04 DIAGNOSIS — I159 Secondary hypertension, unspecified: Secondary | ICD-10-CM

## 2018-01-04 LAB — CBC WITH DIFFERENTIAL/PLATELET
BASOS ABS: 0 10*3/uL (ref 0.0–0.2)
Basos: 1 %
EOS (ABSOLUTE): 0 10*3/uL (ref 0.0–0.4)
EOS: 1 %
HEMOGLOBIN: 16.2 g/dL (ref 13.0–17.7)
Hematocrit: 46.6 % (ref 37.5–51.0)
IMMATURE GRANS (ABS): 0 10*3/uL (ref 0.0–0.1)
IMMATURE GRANULOCYTES: 0 %
LYMPHS: 51 %
Lymphocytes Absolute: 1.5 10*3/uL (ref 0.7–3.1)
MCH: 33.7 pg — ABNORMAL HIGH (ref 26.6–33.0)
MCHC: 34.8 g/dL (ref 31.5–35.7)
MCV: 97 fL (ref 79–97)
MONOCYTES: 11 %
Monocytes Absolute: 0.3 10*3/uL (ref 0.1–0.9)
NEUTROS PCT: 36 %
Neutrophils Absolute: 1 10*3/uL — ABNORMAL LOW (ref 1.4–7.0)
PLATELETS: 174 10*3/uL (ref 150–379)
RBC: 4.81 x10E6/uL (ref 4.14–5.80)
RDW: 14.3 % (ref 12.3–15.4)
WBC: 2.8 10*3/uL — AB (ref 3.4–10.8)

## 2018-01-04 LAB — BASIC METABOLIC PANEL
BUN/Creatinine Ratio: 10 (ref 9–20)
BUN: 15 mg/dL (ref 6–24)
CALCIUM: 9.3 mg/dL (ref 8.7–10.2)
CHLORIDE: 106 mmol/L (ref 96–106)
CO2: 22 mmol/L (ref 20–29)
CREATININE: 1.45 mg/dL — AB (ref 0.76–1.27)
GFR calc Af Amer: 62 mL/min/{1.73_m2} (ref >59)
GFR calc non Af Amer: 53 mL/min/{1.73_m2} — ABNORMAL LOW (ref >59)
GLUCOSE: 80 mg/dL (ref 65–99)
Potassium: 4.1 mmol/L (ref 3.5–5.2)
Sodium: 146 mmol/L — ABNORMAL HIGH (ref 134–144)

## 2018-01-04 NOTE — Telephone Encounter (Signed)
New message    Patient calling with questions about post op procedure care, and healing timeframe.  Please call

## 2018-01-04 NOTE — Telephone Encounter (Signed)
New message ° °Pt verbalized that he is returning call for RN °

## 2018-01-04 NOTE — Telephone Encounter (Signed)
lmtcb

## 2018-01-07 ENCOUNTER — Other Ambulatory Visit (HOSPITAL_COMMUNITY): Payer: Self-pay | Admitting: *Deleted

## 2018-01-07 ENCOUNTER — Telehealth (HOSPITAL_COMMUNITY): Payer: Self-pay | Admitting: Vascular Surgery

## 2018-01-07 DIAGNOSIS — I5022 Chronic systolic (congestive) heart failure: Secondary | ICD-10-CM

## 2018-01-07 DIAGNOSIS — I159 Secondary hypertension, unspecified: Secondary | ICD-10-CM

## 2018-01-07 MED ORDER — DIGOXIN 125 MCG PO TABS
ORAL_TABLET | ORAL | 3 refills | Status: DC
Start: 2018-01-07 — End: 2018-07-26

## 2018-01-07 MED ORDER — POTASSIUM CHLORIDE CRYS ER 20 MEQ PO TBCR: 20.0000 meq | EXTENDED_RELEASE_TABLET | Freq: Every day | ORAL | 1 refills | Status: DC

## 2018-01-07 MED ORDER — LOSARTAN POTASSIUM 50 MG PO TABS: ORAL_TABLET | ORAL | 1 refills | Status: DC

## 2018-01-07 MED ORDER — FUROSEMIDE 40 MG PO TABS: 40.0000 mg | ORAL_TABLET | Freq: Every day | ORAL | 1 refills | Status: DC

## 2018-01-07 MED ORDER — METOPROLOL SUCCINATE ER 50 MG PO TB24: 50.0000 mg | ORAL_TABLET | Freq: Every day | ORAL | 1 refills | Status: DC

## 2018-01-07 MED ORDER — EPLERENONE 25 MG PO TABS: 12.5000 mg | ORAL_TABLET | Freq: Every day | ORAL | 3 refills | Status: DC

## 2018-01-07 MED ORDER — HYDROXYZINE HCL 25 MG PO TABS: 25.0000 mg | ORAL_TABLET | Freq: Every day | ORAL | 1 refills | Status: DC

## 2018-01-07 NOTE — Telephone Encounter (Signed)
Left pt to make f/u appt w/ APP

## 2018-01-08 ENCOUNTER — Encounter (HOSPITAL_COMMUNITY): Payer: Self-pay | Admitting: General Practice

## 2018-01-08 ENCOUNTER — Ambulatory Visit (HOSPITAL_COMMUNITY)
Admission: RE | Admit: 2018-01-08 | Discharge: 2018-01-09 | Disposition: A | Discharge status: Home / Self Care | Payer: Medicare Other | Source: Ambulatory Visit | Attending: Cardiology | Admitting: Cardiology

## 2018-01-08 ENCOUNTER — Encounter (HOSPITAL_COMMUNITY)
Admission: RE | Disposition: A | Discharge status: Home / Self Care | Payer: Self-pay | Source: Ambulatory Visit | Attending: Cardiology

## 2018-01-08 ENCOUNTER — Other Ambulatory Visit: Payer: Self-pay

## 2018-01-08 DIAGNOSIS — Z006 Encounter for examination for normal comparison and control in clinical research program: Secondary | ICD-10-CM | POA: Diagnosis not present

## 2018-01-08 DIAGNOSIS — I428 Other cardiomyopathies: Secondary | ICD-10-CM | POA: Diagnosis not present

## 2018-01-08 DIAGNOSIS — J449 Chronic obstructive pulmonary disease, unspecified: Secondary | ICD-10-CM | POA: Insufficient documentation

## 2018-01-08 DIAGNOSIS — Z21 Asymptomatic human immunodeficiency virus [HIV] infection status: Secondary | ICD-10-CM | POA: Insufficient documentation

## 2018-01-08 DIAGNOSIS — Z7982 Long term (current) use of aspirin: Secondary | ICD-10-CM | POA: Insufficient documentation

## 2018-01-08 DIAGNOSIS — I509 Heart failure, unspecified: Secondary | ICD-10-CM | POA: Insufficient documentation

## 2018-01-08 DIAGNOSIS — Z95818 Presence of other cardiac implants and grafts: Secondary | ICD-10-CM

## 2018-01-08 DIAGNOSIS — I110 Hypertensive heart disease with heart failure: Secondary | ICD-10-CM | POA: Diagnosis not present

## 2018-01-08 DIAGNOSIS — I2510 Atherosclerotic heart disease of native coronary artery without angina pectoris: Secondary | ICD-10-CM | POA: Insufficient documentation

## 2018-01-08 DIAGNOSIS — Z79899 Other long term (current) drug therapy: Secondary | ICD-10-CM | POA: Diagnosis not present

## 2018-01-08 HISTORY — PX: ICD IMPLANT: EP1208

## 2018-01-08 HISTORY — DX: Presence of automatic (implantable) cardiac defibrillator: Z95.810

## 2018-01-08 HISTORY — DX: Unspecified chronic bronchitis: J42

## 2018-01-08 HISTORY — PX: CARDIAC DEFIBRILLATOR PLACEMENT: SHX171

## 2018-01-08 LAB — SURGICAL PCR SCREEN
MRSA, PCR: NEGATIVE
STAPHYLOCOCCUS AUREUS: NEGATIVE

## 2018-01-08 SURGERY — ICD IMPLANT

## 2018-01-08 MED ORDER — ISOPROTERENOL HCL 0.2 MG/ML IJ SOLN
INTRAMUSCULAR | Status: AC
  Filled 2018-01-08: qty 5

## 2018-01-08 MED ORDER — CEFAZOLIN SODIUM-DEXTROSE 1-4 GM/50ML-% IV SOLN
1.0000 g | Freq: Four times a day (QID) | INTRAVENOUS | Status: AC
  Administered 2018-01-08 – 2018-01-09 (×3): 1 g via INTRAVENOUS
  Filled 2018-01-08 (×3): qty 50

## 2018-01-08 MED ORDER — MIDAZOLAM HCL 5 MG/5ML IJ SOLN
INTRAMUSCULAR | Status: DC | PRN
  Administered 2018-01-08 (×3): 1 mg via INTRAVENOUS

## 2018-01-08 MED ORDER — HEPARIN (PORCINE) IN NACL 2-0.9 UNIT/ML-% IJ SOLN
INTRAMUSCULAR | Status: AC | PRN
  Administered 2018-01-08: 500 mL

## 2018-01-08 MED ORDER — CEFAZOLIN SODIUM-DEXTROSE 2-4 GM/100ML-% IV SOLN
2.0000 g | INTRAVENOUS | Status: AC
  Administered 2018-01-08: 2 g via INTRAVENOUS

## 2018-01-08 MED ORDER — ASPIRIN 81 MG PO CHEW
81.0000 mg | CHEWABLE_TABLET | Freq: Every morning | ORAL | Status: DC
  Administered 2018-01-09: 81 mg via ORAL
  Filled 2018-01-08: qty 1

## 2018-01-08 MED ORDER — HYDROXYZINE HCL 25 MG PO TABS
25.0000 mg | ORAL_TABLET | Freq: Every evening | ORAL | Status: DC | PRN
  Filled 2018-01-08: qty 1

## 2018-01-08 MED ORDER — ACETAMINOPHEN 325 MG PO TABS
325.0000 mg | ORAL_TABLET | ORAL | Status: DC | PRN
  Administered 2018-01-08: 18:00:00 650 mg via ORAL
  Filled 2018-01-08: qty 2

## 2018-01-08 MED ORDER — FUROSEMIDE 40 MG PO TABS
40.0000 mg | ORAL_TABLET | Freq: Every day | ORAL | Status: DC
  Administered 2018-01-08: 40 mg via ORAL
  Filled 2018-01-08 (×2): qty 1

## 2018-01-08 MED ORDER — CEFAZOLIN SODIUM-DEXTROSE 2-4 GM/100ML-% IV SOLN
INTRAVENOUS | Status: AC
  Filled 2018-01-08: qty 100

## 2018-01-08 MED ORDER — SODIUM CHLORIDE 0.9 % IR SOLN
80.0000 mg | Status: AC
  Administered 2018-01-08: 80 mg

## 2018-01-08 MED ORDER — HYDROXYZINE HCL 25 MG PO TABS
25.0000 mg | ORAL_TABLET | Freq: Every day | ORAL | Status: DC
  Administered 2018-01-08: 21:00:00 25 mg via ORAL
  Filled 2018-01-08: qty 1

## 2018-01-08 MED ORDER — DIGOXIN 125 MCG PO TABS
0.1250 mg | ORAL_TABLET | Freq: Every day | ORAL | Status: DC
  Administered 2018-01-09: 0.125 mg via ORAL
  Filled 2018-01-08: qty 1

## 2018-01-08 MED ORDER — BUPIVACAINE HCL (PF) 0.25 % IJ SOLN
INTRAMUSCULAR | Status: AC
  Filled 2018-01-08: qty 30

## 2018-01-08 MED ORDER — BICTEGRAVIR-EMTRICITAB-TENOFOV 50-200-25 MG PO TABS
1.0000 | ORAL_TABLET | Freq: Every day | ORAL | Status: DC
  Administered 2018-01-09: 10:00:00 1 via ORAL
  Filled 2018-01-08: qty 1

## 2018-01-08 MED ORDER — VALACYCLOVIR HCL 500 MG PO TABS
500.0000 mg | ORAL_TABLET | Freq: Every day | ORAL | Status: DC
  Administered 2018-01-09: 500 mg via ORAL
  Filled 2018-01-08: qty 1

## 2018-01-08 MED ORDER — LOSARTAN POTASSIUM 50 MG PO TABS
50.0000 mg | ORAL_TABLET | Freq: Every day | ORAL | Status: DC
  Administered 2018-01-09: 10:00:00 50 mg via ORAL
  Filled 2018-01-08: qty 1

## 2018-01-08 MED ORDER — TRAMADOL HCL 50 MG PO TABS
50.0000 mg | ORAL_TABLET | Freq: Four times a day (QID) | ORAL | Status: DC | PRN
Start: 2018-01-08 — End: 2018-01-09
  Administered 2018-01-08 – 2018-01-09 (×2): 50 mg via ORAL
  Filled 2018-01-08 (×2): qty 1

## 2018-01-08 MED ORDER — SILDENAFIL CITRATE 50 MG PO TABS: 50.0000 mg | ORAL_TABLET | Freq: Every day | ORAL | Status: DC | PRN

## 2018-01-08 MED ORDER — HEPARIN SODIUM (PORCINE) 1000 UNIT/ML IJ SOLN
INTRAMUSCULAR | Status: AC
  Filled 2018-01-08: qty 1

## 2018-01-08 MED ORDER — FENTANYL CITRATE (PF) 100 MCG/2ML IJ SOLN
INTRAMUSCULAR | Status: DC | PRN
  Administered 2018-01-08 (×2): 25 ug via INTRAVENOUS

## 2018-01-08 MED ORDER — MUPIROCIN 2 % EX OINT
TOPICAL_OINTMENT | CUTANEOUS | Status: AC
  Administered 2018-01-08: 11:00:00
  Filled 2018-01-08: qty 22

## 2018-01-08 MED ORDER — ADULT MULTIVITAMIN W/MINERALS CH
1.0000 | ORAL_TABLET | Freq: Every day | ORAL | Status: DC
Start: 2018-01-09 — End: 2018-01-09
  Administered 2018-01-09: 10:00:00 1 via ORAL
  Filled 2018-01-08 (×2): qty 1

## 2018-01-08 MED ORDER — FENTANYL CITRATE (PF) 100 MCG/2ML IJ SOLN
INTRAMUSCULAR | Status: AC
Start: 2018-01-08 — End: ?
  Filled 2018-01-08: qty 2

## 2018-01-08 MED ORDER — ALBUTEROL SULFATE (2.5 MG/3ML) 0.083% IN NEBU: 2.5000 mg | INHALATION_SOLUTION | RESPIRATORY_TRACT | Status: DC | PRN

## 2018-01-08 MED ORDER — SPIRONOLACTONE 25 MG PO TABS
25.0000 mg | ORAL_TABLET | Freq: Every day | ORAL | Status: DC
  Administered 2018-01-09: 10:00:00 25 mg via ORAL
  Filled 2018-01-08: qty 1

## 2018-01-08 MED ORDER — SODIUM CHLORIDE 0.9 % IV SOLN
INTRAVENOUS | Status: DC
  Administered 2018-01-08: 11:00:00 via INTRAVENOUS

## 2018-01-08 MED ORDER — SODIUM CHLORIDE 0.9 % IR SOLN
Status: AC
  Filled 2018-01-08: qty 2

## 2018-01-08 MED ORDER — ONDANSETRON HCL 4 MG/2ML IJ SOLN: 4.0000 mg | Freq: Four times a day (QID) | INTRAMUSCULAR | Status: DC | PRN

## 2018-01-08 MED ORDER — IOPAMIDOL (ISOVUE-370) INJECTION 76%
INTRAVENOUS | Status: AC
  Filled 2018-01-08: qty 50

## 2018-01-08 MED ORDER — METOPROLOL SUCCINATE ER 50 MG PO TB24
50.0000 mg | ORAL_TABLET | Freq: Every day | ORAL | Status: DC
  Administered 2018-01-08: 21:00:00 50 mg via ORAL
  Filled 2018-01-08: qty 1

## 2018-01-08 MED ORDER — LIDOCAINE HCL (PF) 1 % IJ SOLN
INTRAMUSCULAR | Status: AC
  Filled 2018-01-08: qty 60

## 2018-01-08 MED ORDER — LIDOCAINE HCL (PF) 1 % IJ SOLN
INTRAMUSCULAR | Status: DC | PRN
  Administered 2018-01-08: 40 mL

## 2018-01-08 MED ORDER — POTASSIUM CHLORIDE CRYS ER 20 MEQ PO TBCR
20.0000 meq | EXTENDED_RELEASE_TABLET | Freq: Every day | ORAL | Status: DC
  Administered 2018-01-09: 10:00:00 20 meq via ORAL
  Filled 2018-01-08: qty 1

## 2018-01-08 MED ORDER — YOU HAVE A PACEMAKER BOOK
Freq: Once | Status: AC
  Administered 2018-01-08: 21:00:00 1
  Filled 2018-01-08: qty 1

## 2018-01-08 MED ORDER — OXYCODONE-ACETAMINOPHEN 5-325 MG PO TABS
2.0000 | ORAL_TABLET | ORAL | Status: AC
  Administered 2018-01-08: 2 via ORAL
  Filled 2018-01-08: qty 2

## 2018-01-08 MED ORDER — MIDAZOLAM HCL 5 MG/5ML IJ SOLN
INTRAMUSCULAR | Status: AC
  Filled 2018-01-08: qty 5

## 2018-01-08 SURGICAL SUPPLY — 6 items
CABLE SURGICAL S-101-97-12 (CABLE) ×2 IMPLANT
ICD ELLIPSE VR CD1411-36Q (ICD Generator) ×2 IMPLANT
LEAD DURATA 7122Q-65CM (Lead) ×2 IMPLANT
PAD DEFIB LIFELINK (PAD) ×2 IMPLANT
SHEATH CLASSIC 7F (SHEATH) ×2 IMPLANT
TRAY PACEMAKER INSERTION (PACKS) ×2 IMPLANT

## 2018-01-08 NOTE — Discharge Summary (Signed)
ELECTROPHYSIOLOGY PROCEDURE DISCHARGE SUMMARY    Patient ID: Ryan Long,  MRN: [id number], DOB/AGE: 1961/11/19 57 y.o.  Admit date: 01/08/2018 Discharge date: 01/09/18  Primary Care Physician: Jaclyn Shaggy, MD  Primary Cardiologist: Dr. Gala Romney Electrophysiologist: Dr. Elberta Fortis  Primary Discharge Diagnosis:  1. NICM 2. Chronic CHF  Secondary Discharge Diagnosis:  1. HTN 2. COPD 3. HIV 4. Hx of poly drug abuse  No Known Allergies   Procedures This Admission:  1.  Implantation of a SJM single chamber ICD on 01/08/18 by Dr Elberta Fortis.  The patient received a [employer], model 0258N-27 (serial number [id number]) right ventricular defibrillator lead, [employer] [place] PO2423-53I (serial  Number [id number]) ICD. DFT's were deferred at time of implant.  There were no immediate post procedure complications. 2.  CXR on 01/09/18 demonstrated no pneumothorax status post device implantation.   Brief HPI: Ryan Long is a 57 y.o. male was referred to electrophysiology in the outpatient setting for consideration of ICD implantation.  Past medical history is noted above.  The patient has persistent LV dysfunction despite guideline directed therapy.  Risks, benefits, and alternatives to ICD implantation were reviewed with the patient who wished to proceed.   Hospital Course:  The patient was admitted and underwent implantation of an ICD with details as outlined above. He was monitored on telemetry overnight which demonstrated SR.  Left chest was without hematoma or ecchymosis.  The device was interrogated and found to be functioning normally.  CXR was obtained and demonstrated no pneumothorax status post device implantation.  Wound care, arm mobility, and restrictions were reviewed with the patient.  The patient was examined by Dr. Elberta Fortis and considered stable for discharge to home.   The patient's discharge medications include an ARB (losartan) and beta  blocker (metoprolol).   Physical Exam: Vitals:   01/09/18 0420 01/09/18 0430 01/09/18 0605 01/09/18 0726  BP:  120/84  124/90  Pulse:    75  Resp:    14  Temp: 98.4 F (36.9 C)   98.1 F (36.7 C)  TempSrc: Oral   Oral  SpO2:  100%  100%  Weight:   202 lb 13.2 oz (92 kg)   Height:        GEN- The patient is well appearing, alert and oriented x 3 today. HEENT: normocephalic, atraumatic; sclera clear, conjunctiva pink; hearing intact; oropharynx clear Lungs- CTA b/l, normal work of breathing.  No wheezes, rales, rhonchi Heart- RRR, no murmurs, rubs or gallops, PMI not laterally displaced GI- soft, non-tender, non-distended, Extremities- no clubbing, cyanosis, or edema MS- no significant deformity or atrophy Skin- warm and dry, no rash or lesion, left chest without hematoma/ecchymosis Psych- euthymic mood, full affect Neuro- no gross defecits  Labs:   Lab Results  Component Value Date   WBC 2.8 (L) 01/03/2018   HGB 16.2 01/03/2018   HCT 46.6 01/03/2018   MCV 97 01/03/2018   PLT 174 01/03/2018    Recent Labs  Lab 01/03/18 1517  NA 146*  K 4.1  CL 106  CO2 22  BUN 15  CREATININE 1.45*  CALCIUM 9.3  GLUCOSE 80    Discharge Medications:  Allergies as of 01/09/2018   No Known Allergies     Medication List    TAKE these medications   aspirin 81 MG chewable tablet Chew 1 tablet (81 mg total) by mouth every morning. For heart health   bictegravir-emtricitabine-tenofovir AF 50-200-25 MG Tabs tablet Commonly known as:  BIKTARVY Take 1 tablet by mouth daily.   digoxin 0.125 MG tablet Commonly known as:  DIGOX TAKE 1 TABLET BY MOUTH DAILY( FOR CONTROL OF HEART ARRYTHMIA)   eplerenone 25 MG tablet Commonly known as:  INSPRA Take 0.5 tablets (12.5 mg total) by mouth daily.   furosemide 40 MG tablet Commonly known as:  LASIX Take 1 tablet (40 mg total) by mouth daily.   hydrOXYzine 25 MG tablet Commonly known as:  ATARAX/VISTARIL Take 1-2 tablets (25-50 mg  total) by mouth at bedtime.   losartan 50 MG tablet Commonly known as:  COZAAR TAKE 1 TABLET(50 MG) BY MOUTH DAILY   metoprolol succinate 50 MG 24 hr tablet Commonly known as:  TOPROL-XL Take 1 tablet (50 mg total) by mouth at bedtime. For high blood pressure   multivitamin with minerals tablet Take 1 tablet by mouth daily.   potassium chloride SA 20 MEQ tablet Commonly known as:  K-DUR,KLOR-CON Take 1 tablet (20 mEq total) by mouth daily.   sildenafil 50 MG tablet Commonly known as:  VIAGRA Take 1 tablet (50 mg total) by mouth daily as needed for erectile dysfunction. At least 24 hours between doses   valACYclovir 500 MG tablet Commonly known as:  VALTREX Take 1 tablet (500 mg total) by mouth daily. For Herpes.   VENTOLIN HFA 108 (90 Base) MCG/ACT inhaler Generic drug:  albuterol INHALE 2 PUFFS INTO THE LUNGS EVERY 6 HOURS AS NEEDED FOR WHEEZING OR SHORTNESS OF BREATH       Disposition:  Home  Discharge Instructions    Diet - low sodium heart healthy   Complete by:  As directed    Increase activity slowly   Complete by:  As directed      Follow-up Information    [facility] Follow up on 01/23/2018.   Specialty:  Cardiology Why:  9:30AM, wound check visit Contact information: [address], [address] [place] [address] [phone]       Regan Lemming, MD Follow up on 04/10/2018.   Specialty:  Cardiology Why:  10:30AM Contact information: [address] [place] [address] [phone]           Duration of Discharge Encounter: Greater than 30 minutes including physician time.  Norma Fredrickson, PA-C 01/09/2018 9:21 AM    I have seen and examined this patient with Francis Dowse.  Agree with above, note added to reflect my findings.  On exam, RRR, no murmurs, lungs clear.  ICD implanted for nonischemic cardiomyopathy.  Chest x-ray and interrogation without issue.  Plan for discharge with  follow-up in device clinic.  Kannon Baum M. Melea Prezioso MD 01/09/2018 10:18 AM

## 2018-01-08 NOTE — H&P (Addendum)
Ryan Long has presented today for surgery, with the diagnosis of CHF.  The various methods of treatment have been discussed with the patient and family. After consideration of risks, benefits and other options for treatment, the patient has consented to  Procedure(s): ICD implant as a surgical intervention .  Risks include but not limited to bleeding, tamponade, infection, pneumothorax, among others. The patient's history has been reviewed, patient examined, no change in status, stable for surgery.  I have reviewed the patient's chart and labs.  Questions were answered to the patient's satisfaction.    Marsden Zaino Elberta Fortis, MD 01/08/2018 11:11 AM  ICD Criteria  Current LVEF:25-30%. Within 12 months prior to implant: Yes   Heart failure history: Yes, Class II  Cardiomyopathy history: Yes, Non-Ischemic Cardiomyopathy.  Atrial Fibrillation/Atrial Flutter: No.  Ventricular tachycardia history: No.  Cardiac arrest history: No.  History of syndromes with risk of sudden death: No.  Previous ICD: No.  Current ICD indication: Primary  PPM indication: No.   Class I or II Bradycardia indication present: No  Beta Blocker therapy for 3 or more months: Yes, prescribed.   Ace Inhibitor/ARB therapy for 3 or more months: Yes, prescribed.

## 2018-01-08 NOTE — Discharge Instructions (Signed)
° ° °  Supplemental Discharge Instructions for  °Pacemaker/Defibrillator Patients ° °Activity °No heavy lifting or vigorous activity with your left/right arm for 6 to 8 weeks.  Do not raise your left/right arm above your head for one week.  Gradually raise your affected arm as drawn below. ° °        °    01/12/18                    01/13/18                    01/14/18                   01/15/18 °__ ° °NO DRIVING for  1 week  ; you may begin driving on  01/15/18 . ° °WOUND CARE °- Keep the wound area clean and dry.  Do not get this area wet, no showers until cleared to at your wound check visit . °- The tape/steri-strips on your wound will fall off; do not pull them off.  No bandage is needed on the site.  DO  NOT apply any creams, oils, or ointments to the wound area. °- If you notice any drainage or discharge from the wound, any swelling or bruising at the site, or you develop a fever > 101? F after you are discharged home, call the office at once. ° °Special Instructions °- You are still able to use cellular telephones; use the ear opposite the side where you have your pacemaker/defibrillator.  Avoid carrying your cellular phone near your device. °- When traveling through airports, show security personnel your identification card to avoid being screened in the metal detectors.  Ask the security personnel to use the hand wand. °- Avoid arc welding equipment, MRI testing (magnetic resonance imaging), TENS units (transcutaneous nerve stimulators).  Call the office for questions about other devices. °- Avoid electrical appliances that are in poor condition or are not properly grounded. °- Microwave ovens are safe to be near or to operate. ° °Additional information for defibrillator patients should your device go off: °- If your device goes off ONCE and you feel fine afterward, notify the device clinic nurses. °- If your device goes off ONCE and you do not feel well afterward, call 911. °- If your device goes off TWICE,  call 911. °- If your device goes off THREE times in one day, call 911. ° °DO NOT DRIVE YOURSELF OR A FAMILY MEMBER °WITH A DEFIBRILLATOR TO THE HOSPITAL--CALL 911. ° °

## 2018-01-09 ENCOUNTER — Encounter (HOSPITAL_COMMUNITY): Payer: Self-pay | Admitting: Cardiology

## 2018-01-09 ENCOUNTER — Ambulatory Visit (HOSPITAL_COMMUNITY): Payer: Medicare Other

## 2018-01-09 DIAGNOSIS — Z79899 Other long term (current) drug therapy: Secondary | ICD-10-CM | POA: Diagnosis not present

## 2018-01-09 DIAGNOSIS — I509 Heart failure, unspecified: Secondary | ICD-10-CM | POA: Diagnosis not present

## 2018-01-09 DIAGNOSIS — J449 Chronic obstructive pulmonary disease, unspecified: Secondary | ICD-10-CM | POA: Diagnosis not present

## 2018-01-09 DIAGNOSIS — I428 Other cardiomyopathies: Secondary | ICD-10-CM | POA: Diagnosis not present

## 2018-01-09 DIAGNOSIS — I110 Hypertensive heart disease with heart failure: Secondary | ICD-10-CM | POA: Diagnosis not present

## 2018-01-09 DIAGNOSIS — Z7982 Long term (current) use of aspirin: Secondary | ICD-10-CM | POA: Diagnosis not present

## 2018-01-09 DIAGNOSIS — Z452 Encounter for adjustment and management of vascular access device: Secondary | ICD-10-CM | POA: Diagnosis not present

## 2018-01-09 DIAGNOSIS — I2510 Atherosclerotic heart disease of native coronary artery without angina pectoris: Secondary | ICD-10-CM | POA: Diagnosis not present

## 2018-01-09 DIAGNOSIS — Z21 Asymptomatic human immunodeficiency virus [HIV] infection status: Secondary | ICD-10-CM | POA: Diagnosis not present

## 2018-01-09 DIAGNOSIS — Z006 Encounter for examination for normal comparison and control in clinical research program: Secondary | ICD-10-CM | POA: Diagnosis not present

## 2018-01-09 NOTE — Care Management Note (Signed)
Case Management Note  Patient Details  Name: Ryan Long MRN: [id number] Date of Birth: 1961-04-14  Subjective/Objective:    From home, s/p ICD implant, for dc today, no needs.                 Action/Plan: DC home no needs.  Expected Discharge Date:  01/09/18               Expected Discharge Plan:  Home/Self Care  In-House Referral:     Discharge planning Services  CM Consult  Post Acute Care Choice:    Choice offered to:     DME Arranged:    DME Agency:     HH Arranged:    HH Agency:     Status of Service:  Completed, signed off  If discussed at [employer] of Stay Meetings, dates discussed:    Additional Comments:  Leone Haven, RN 01/09/2018, 10:35 AM

## 2018-01-13 DIAGNOSIS — I97610 Postprocedural hemorrhage and hematoma of a circulatory system organ or structure following a cardiac catheterization: Secondary | ICD-10-CM | POA: Diagnosis not present

## 2018-01-13 NOTE — Progress Notes (Signed)
Advanced Heart Failure Clinic Note   Patient ID: Ryan Long, male   DOB: [birth year], 57 y.o.   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  Coronary CT angiogram in 3/12 demonstrated no CAD.  Cardiolite in 2015 showed no ischemia or infarction.  NICM was not felt to be related to HIV given his good CD4 counts.   He is S/P St Jude ICD 12/2017  He was evaluated [facility] 12/25/17 with suicidal ideation. He was placed on IVC with psych consulted. He was later cleared and discharged.   Today he returns for HF follow up. Last week he underwent ST jude ICD placement. Last night he was seen at [facility] ED with drainage noted from ICD site. Says he has not been doing strenuous activity. He was given a dose of keflex and sent home. Overall feeling ok.  Denies SOB/PND/Orthopnea. Appetite ok. No fever or chills. He has not been weighing at home because he lives in a shelter. Says he is taking all medications but not sure the names or the amount of medication he has. Living at [facility]. He has trouble with transportation.   ECHO 06/2013 EF 35-40% ECHO 9/16 with EF 30-35%, diffuse hypokinesis, grade I diastolic dysfunction ECHO 09/2017 EF 25-30%    Past Medical History:  Diagnosis Date  . Active smoker   . AICD (automatic cardioverter/defibrillator) present   . Alcohol abuse   . Anxiety   . AR (allergic rhinitis)   . Bipolar 1 disorder ([place])   . CHF (congestive heart failure) ([place])   . Chronic bronchitis ([place])   . Chronic systolic heart failure ([place])   . COPD (chronic obstructive pulmonary disease) ([place])   . Crack cocaine use   . Depression   . Genital herpes   . HIV (human immunodeficiency virus infection) ([place]) dx'd 08/1993  . HLD  (hyperlipidemia)   . Hypertension   . NICM (nonischemic cardiomyopathy) ([place])    Echocardiogram 06/28/11: EF 30-35%, mild MR, mild LAE;  No CAD by coronary CT angiogram 3/12 at [facility]  . NSVT (nonsustained ventricular tachycardia) ([place])   . PTSD (post-traumatic stress disorder)     Current Outpatient Medications  Medication Sig Dispense Refill  . aspirin 81 MG chewable tablet Chew 1 tablet (81 mg total) by mouth every morning. For heart health    . bictegravir-emtricitabine-tenofovir AF (BIKTARVY) 50-200-25 MG TABS tablet Take 1 tablet by mouth daily. 30 tablet 5  . digoxin (DIGOX) 0.125 MG tablet TAKE 1 TABLET BY MOUTH DAILY( FOR CONTROL OF HEART ARRYTHMIA) 30 tablet 3  . eplerenone (INSPRA) 25 MG tablet Take 0.5 tablets (12.5 mg total) by mouth daily. 45 tablet 3  . furosemide (LASIX) 40 MG tablet Take 1 tablet (40 mg total) by mouth daily. 90 tablet 1  . hydrOXYzine (ATARAX/VISTARIL) 25 MG tablet Take 1-2 tablets (25-50 mg total) by mouth at bedtime. 30 tablet 1  . losartan (COZAAR) 50 MG tablet TAKE 1 TABLET(50 MG) BY MOUTH DAILY 90 tablet 1  . metoprolol succinate (TOPROL-XL) 50 MG 24 hr tablet Take 1 tablet (50 mg total) by mouth at bedtime. For high blood pressure 90 tablet 1  . Multiple Vitamins-Minerals (MULTIVITAMIN WITH MINERALS) tablet Take 1 tablet by mouth daily.    Marland Kitchen  potassium chloride SA (K-DUR,KLOR-CON) 20 MEQ tablet Take 1 tablet (20 mEq total) by mouth daily. 90 tablet 1  . sildenafil (VIAGRA) 50 MG tablet Take 1 tablet (50 mg total) by mouth daily as needed for erectile dysfunction. At least 24 hours between doses 20 tablet 1  . valACYclovir (VALTREX) 500 MG tablet Take 1 tablet (500 mg total) by mouth daily. For Herpes. 30 tablet 1  . VENTOLIN HFA 108 (90 Base) MCG/ACT inhaler INHALE 2 PUFFS INTO THE LUNGS EVERY 6 HOURS AS NEEDED FOR WHEEZING OR SHORTNESS OF BREATH 18 g 1   No current facility-administered medications for this encounter.      Allergies: No Known Allergies   Review of systems complete and found to be negative unless listed in HPI.    Vital Signs:  Vitals:   01/14/18 0901  BP: (!) 138/92  Pulse: 97  SpO2: 97%  Weight: 202 lb (91.6 kg)    Wt Readings from Last 3 Encounters:  01/14/18 202 lb (91.6 kg)  01/09/18 202 lb 13.2 oz (92 kg)  01/03/18 205 lb (93 kg)    PHYSICAL EXAM: General:  Well appearing. No resp difficulty HEENT: normal Neck: supple. no JVD. Carotids 2+ bilat; no bruits. No lymphadenopathy or thryomegaly appreciated. Cor: PMI nondisplaced. Regular rate & rhythm. No rubs, gallops or murmurs. L upper chest ICD incision with moderate sero//sang exudate. Tender to touch. Erythema noted.  Lungs: clear Abdomen: soft, nontender, nondistended. No hepatosplenomegaly. No bruits or masses. Good bowel sounds. Extremities: no cyanosis, clubbing, rash, edema Neuro: alert & orientedx3, cranial nerves grossly intact. moves all 4 extremities w/o difficulty. Affect pleasant  ASSESSMENT AND PLAN:  1. Chronic Systolic Heart Failure: Nonischemic cardiomyopathy.  Echo 09/2017 EF 20-25%.  S/P St Jude ICD  - NYHA II.  - Volume status stable.  - Continue current HF medications for now. He is not sure what medications he has at home. Paramedicine will follow up later this week.  - Volume status stable on exam.  - Continue lasix 40 mg daily.  -Continue toprol xl  50 mg daily. - Continue losartan 50 mg daily.  - Continue inspra 12.5 mg daily - Continue digoxin 0.125 mg daily.  2. HIV:  - He has follow up with Dr Ryan Long.  No change.  3. Tobacco use disorder:   - Encouraged complete cessation.  4. Former Cocaine - Denies usage.  5. Cellulitis L upper chest ICD with erythema and drainage.  I contacted Ryan Marshall NP with EP. We assessed together. Start keflex 500 mg three times a day for 7 days.  He will follow up next week with EP. There is concern that this is due to an infection. He is aware if the  this worsens he should call the EP office for an evaluation.   Follow up in 4-6 weeks. Reconsult Paramedicine. I am not sure which medications he is taking. I called Zach to resume HF Paramedicine. HFSW met with him today during the office visit.   Follow up in 4-6 weeks.  Greater than 50% of the (total minutes 25*) visit spent in counseling/coordination of care regarding medication change, s/s of infection, and to follow up with EP.    Darrick Grinder, NP  01/14/2018

## 2018-01-14 ENCOUNTER — Encounter (HOSPITAL_COMMUNITY): Payer: Self-pay

## 2018-01-14 ENCOUNTER — Ambulatory Visit (HOSPITAL_COMMUNITY)
Admission: RE | Admit: 2018-01-14 | Discharge: 2018-01-14 | Disposition: A | Discharge status: Home / Self Care | Payer: Medicare Other | Source: Ambulatory Visit | Attending: Internal Medicine | Admitting: Internal Medicine

## 2018-01-14 VITALS — BP 138/92 | HR 97 | Wt 202.0 lb

## 2018-01-14 DIAGNOSIS — F319 Bipolar disorder, unspecified: Secondary | ICD-10-CM | POA: Insufficient documentation

## 2018-01-14 DIAGNOSIS — F4310 Post-traumatic stress disorder, unspecified: Secondary | ICD-10-CM | POA: Diagnosis not present

## 2018-01-14 DIAGNOSIS — I110 Hypertensive heart disease with heart failure: Secondary | ICD-10-CM | POA: Insufficient documentation

## 2018-01-14 DIAGNOSIS — Z720 Tobacco use: Secondary | ICD-10-CM | POA: Diagnosis not present

## 2018-01-14 DIAGNOSIS — Z7982 Long term (current) use of aspirin: Secondary | ICD-10-CM | POA: Diagnosis not present

## 2018-01-14 DIAGNOSIS — I429 Cardiomyopathy, unspecified: Secondary | ICD-10-CM | POA: Insufficient documentation

## 2018-01-14 DIAGNOSIS — L03313 Cellulitis of chest wall: Secondary | ICD-10-CM | POA: Diagnosis not present

## 2018-01-14 DIAGNOSIS — J449 Chronic obstructive pulmonary disease, unspecified: Secondary | ICD-10-CM | POA: Insufficient documentation

## 2018-01-14 DIAGNOSIS — R45851 Suicidal ideations: Secondary | ICD-10-CM | POA: Insufficient documentation

## 2018-01-14 DIAGNOSIS — F329 Major depressive disorder, single episode, unspecified: Secondary | ICD-10-CM | POA: Insufficient documentation

## 2018-01-14 DIAGNOSIS — I428 Other cardiomyopathies: Secondary | ICD-10-CM

## 2018-01-14 DIAGNOSIS — E785 Hyperlipidemia, unspecified: Secondary | ICD-10-CM | POA: Diagnosis not present

## 2018-01-14 DIAGNOSIS — B20 Human immunodeficiency virus [HIV] disease: Secondary | ICD-10-CM | POA: Diagnosis not present

## 2018-01-14 DIAGNOSIS — Z95810 Presence of automatic (implantable) cardiac defibrillator: Secondary | ICD-10-CM | POA: Insufficient documentation

## 2018-01-14 DIAGNOSIS — Z79899 Other long term (current) drug therapy: Secondary | ICD-10-CM | POA: Insufficient documentation

## 2018-01-14 DIAGNOSIS — I5022 Chronic systolic (congestive) heart failure: Secondary | ICD-10-CM | POA: Insufficient documentation

## 2018-01-14 DIAGNOSIS — Z21 Asymptomatic human immunodeficiency virus [HIV] infection status: Secondary | ICD-10-CM | POA: Insufficient documentation

## 2018-01-14 MED ORDER — CEPHALEXIN 500 MG PO CAPS: 500.0000 mg | ORAL_CAPSULE | Freq: Three times a day (TID) | ORAL | 0 refills | Status: DC

## 2018-01-14 MED FILL — CEPHALEXIN 500 MG CAPSULE: 500 | 7 days supply | Qty: 21 | Fill #0
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
## 2018-01-14 NOTE — Patient Instructions (Signed)
Start Keflex 500 mg three times daily for one week. Take until bottle empty.  Follow up 6 weeks with Amy Clegg NP-C. Take all medication as prescribed the day of your appointment. Bring all medications with you to your appointment.  Do the following things EVERYDAY: 1) Weigh yourself in the morning before breakfast. Write it down and keep it in a log. 2) Take your medicines as prescribed 3) Eat low salt foods-Limit salt (sodium) to 2000 mg per day.  4) Stay as active as you can everyday 5) Limit all fluids for the day to less than 2 liters

## 2018-01-15 ENCOUNTER — Ambulatory Visit (INDEPENDENT_AMBULATORY_CARE_PROVIDER_SITE_OTHER): Payer: Self-pay | Admitting: *Deleted

## 2018-01-15 ENCOUNTER — Ambulatory Visit: Payer: Medicare Other | Admitting: Family Medicine

## 2018-01-15 ENCOUNTER — Telehealth: Payer: Self-pay | Admitting: Cardiology

## 2018-01-15 DIAGNOSIS — I428 Other cardiomyopathies: Secondary | ICD-10-CM

## 2018-01-15 NOTE — Progress Notes (Signed)
Pt seen d/t complaints of drainage, swelling and soreness. Pt apologetic for prior phone call. Pt stated "  This is the first surgery I have ever had and I am so worried about the draining."  Serosanguinous drainage noted on gauze covering steri-strips~ dressing removed, steri-strips still intact over incision site, some bruising noted and small hematoma. Dr. Elberta Fortis seen pt and spoke with him regarding pt's wishes to have device removed. Dr Elberta Fortis recommended pressure dressing, pt agreeable to this plan. 4x4s and pressure dressing applied. Pt given gauze and paper tape if pressure dressing comes loose prior to re-wound check on 01/18/18. Pt voiced understanding of these instructions.

## 2018-01-15 NOTE — Telephone Encounter (Signed)
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 He expressed frustration that his device site has been bleeding, reporting that he had been to the ED and the HF clinic with unsuccessful results. Patient is requesting that his device be removed. I was unable to triage call as patient refused to answer questions, adamantly stating that his device needed to be removed. I offered patient an appointment for today. Patient was agreeable to this. Appointment made for DC to be seen by WC.

## 2018-01-15 NOTE — Telephone Encounter (Signed)
Patient called and stated that he wants his ICD out b/c he has been bleeding for at least 2 days now and he is irritated. Call transferred to Device Clinic RN.

## 2018-01-18 ENCOUNTER — Ambulatory Visit: Payer: Medicare Other

## 2018-01-18 ENCOUNTER — Ambulatory Visit: Payer: Medicare Other | Admitting: Family Medicine

## 2018-01-21 ENCOUNTER — Encounter: Payer: Self-pay | Admitting: Family Medicine

## 2018-01-21 ENCOUNTER — Ambulatory Visit: Payer: Medicare Other | Attending: Family Medicine | Admitting: Family Medicine

## 2018-01-21 VITALS — BP 99/66 | HR 98 | Temp 98.2°F | Ht 69.0 in | Wt 206.0 lb

## 2018-01-21 DIAGNOSIS — B20 Human immunodeficiency virus [HIV] disease: Secondary | ICD-10-CM

## 2018-01-21 DIAGNOSIS — I5022 Chronic systolic (congestive) heart failure: Secondary | ICD-10-CM | POA: Diagnosis not present

## 2018-01-21 DIAGNOSIS — I110 Hypertensive heart disease with heart failure: Secondary | ICD-10-CM | POA: Diagnosis not present

## 2018-01-21 DIAGNOSIS — Z95810 Presence of automatic (implantable) cardiac defibrillator: Secondary | ICD-10-CM | POA: Insufficient documentation

## 2018-01-21 DIAGNOSIS — I428 Other cardiomyopathies: Secondary | ICD-10-CM | POA: Diagnosis not present

## 2018-01-21 DIAGNOSIS — N528 Other male erectile dysfunction: Secondary | ICD-10-CM

## 2018-01-21 DIAGNOSIS — Z79899 Other long term (current) drug therapy: Secondary | ICD-10-CM | POA: Insufficient documentation

## 2018-01-21 DIAGNOSIS — F4310 Post-traumatic stress disorder, unspecified: Secondary | ICD-10-CM | POA: Insufficient documentation

## 2018-01-21 DIAGNOSIS — F419 Anxiety disorder, unspecified: Secondary | ICD-10-CM | POA: Diagnosis not present

## 2018-01-21 DIAGNOSIS — E785 Hyperlipidemia, unspecified: Secondary | ICD-10-CM | POA: Diagnosis not present

## 2018-01-21 DIAGNOSIS — J441 Chronic obstructive pulmonary disease with (acute) exacerbation: Secondary | ICD-10-CM | POA: Diagnosis not present

## 2018-01-21 DIAGNOSIS — F319 Bipolar disorder, unspecified: Secondary | ICD-10-CM | POA: Diagnosis not present

## 2018-01-21 MED ORDER — AZITHROMYCIN 250 MG PO TABS: ORAL_TABLET | ORAL | 0 refills | Status: DC

## 2018-01-21 MED ORDER — SILDENAFIL CITRATE 50 MG PO TABS: 50.0000 mg | ORAL_TABLET | Freq: Every day | ORAL | 0 refills | Status: DC | PRN

## 2018-01-21 MED ORDER — BENZONATATE 100 MG PO CAPS: 100.0000 mg | ORAL_CAPSULE | Freq: Two times a day (BID) | ORAL | 0 refills | Status: DC | PRN
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
## 2018-01-21 NOTE — Progress Notes (Signed)
Patient states that he has had a cough with mucus for 10 days.

## 2018-01-21 NOTE — Patient Instructions (Signed)
Opportunistic Infections  Introduction  An opportunistic infection is an infection that normal immune systems can fight off but weakened immune systems cannot. The germs take this opportunity to invade your body and cause an infection.  WHAT CAUSES A WEAK IMMUNE SYSTEM?  Causes for a weak immune system include:   Taking medicines to decrease your immune system response (immunosuppressant medicines) for:  ? Transplants, such as organ and bone marrow transplants.  ? Autoimmune disorders, such as lupus and rheumatoid arthritis.  ? Chronic conditions like asthma and chronic obstructive pulmonary disease (COPD).   Human immunodeficiency virus/acquired immunodeficiency syndrome (HIV/AIDS).   Chemotherapy.   Antibiotic usage.   Having another infection.   Skin damage, such as a large burn.   Pregnancy.   Malnutrition.    WHAT CAUSES OPPORTUNISTIC INFECTIONS?  Many types of bacteria, viruses, and fungi can cause opportunistic infections. The types of infections that you are at risk for are related to the reason your immune system is weak. For example, the opportunistic infections most likely to affect a person with HIV or AIDS are different from those most likely to affect a person who has recently had an organ transplant and is taking immunosuppressant medicine. Your health care provider will help you understand the types of infections and warning signs to watch for in your case.  HOW DO I PREVENT OPPORTUNISTIC INFECTIONS?   Make sure you are up-to-date on recommended vaccinations.   See your health care provider regularly and follow all recommendations.   Take all medicines exactly as directed by your health care provider.   Practice safe food preparation. Rinse meats, rinse and peel fruits and vegetables, and avoid eating any raw or undercooked meats.   Avoid drinking water that may not be clean. Pools, lakes, rivers, and other recreational water sources can contain germs that cause opportunistic  infections.   Be careful around animals. Make sure pets are vaccinated and that cats are indoor only. Wash your hands after handling any animals, and wear gloves when changing cat litter or working outside in the soil.   Take care around people who are sick. Wash your hands often, wear a protective mask, or even avoid seeing a person while he or she is ill. You may want to stay away from places such as hospitals and daycare centers where germs may be common or where the high number of people increases your chances of catching an infection.   If you use IV drugs, do not reuse or share needles. If you need help to stop using drugs, talk to your health care provider.   Practice safe sex.    HOW DO I KNOW IF I HAVE AN OPPORTUNISTIC INFECTION?  Any time you feel ill, you should make an appointment with your health care provider. Symptoms to watch for include:   Fever.   White patches on your tongue, the back of your throat, or inside your cheeks.   Cough.   Diarrhea.   Fatigue.   Weight loss.   Swollen lymph nodes.    CAN OPPORTUNISTIC INFECTIONS BE TREATED?  If you develop an opportunistic infection, there are treatments available, such as antiviral, antibiotic, and antifungal medicines. These infections may be very serious, and treatment can be challenging. The earlier your health care provider can diagnose and begin treatment for an opportunistic infection, the better. It is very important to call your health care provider or be seen right away if you have any signs or symptoms of infection.  This   information is not intended to replace advice given to you by your health care provider. Make sure you discuss any questions you have with your health care provider.  Document Released: 12/09/2013 Document Revised: 05/11/2016 Document Reviewed: 11/05/2013  Elsevier Interactive Patient Education  2017 [employer] Inc[employer]

## 2018-01-23 ENCOUNTER — Ambulatory Visit: Payer: Medicare Other

## 2018-01-24 ENCOUNTER — Telehealth: Payer: Self-pay | Admitting: Cardiology

## 2018-01-24 ENCOUNTER — Telehealth (HOSPITAL_COMMUNITY): Payer: Self-pay

## 2018-01-24 NOTE — Telephone Encounter (Signed)
New message   Pt would like to know if can take a shower. Please call

## 2018-01-24 NOTE — Telephone Encounter (Signed)
I contacted Ryan Long to schedule an appointment. We agreed on meeting tomorrow at 15:30.

## 2018-01-24 NOTE — Telephone Encounter (Signed)
Ryan Long was seen 01/15/18 for concern about his ICD implant site (DOI 01/08/18). He was scheduled to be seen again 2/1- he no showed and the appt was rescheduled for 2/6 when he again did not show.  Ryan Long is advised not to shower until we can reassess the device pocket. I offered him an appointment 01/28/18 at 4pm- he declined because it was too late. He is currently scheduled to be seen 01/31/18 at 1200. He verbalizes understanding of instructions and confirms his appointment.

## 2018-01-25 ENCOUNTER — Other Ambulatory Visit (HOSPITAL_COMMUNITY): Payer: Self-pay

## 2018-01-25 NOTE — Progress Notes (Signed)
Paramedicine Encounter    Patient ID: Ryan Long, male    DOB: 06-20-1961, 57 y.o.   MRN: [id number]   Patient Care Team: Hoy Register, MD as PCP - General (Family Medicine)  Patient Active Problem List   Diagnosis Date Noted  . NICM (nonischemic cardiomyopathy) (HCC) 01/08/2018  . Anxiety 12/20/2017  . Bipolar 1 disorder (HCC) 10/10/2017  . Breast lump 10/03/2017  . Insomnia 07/16/2017  . Genital herpes 05/13/2017  . HSV-2 infection 07/17/2016  . 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 Screening examination for venereal disease 04/18/2016  . Encounter for long-term (current) use of medications 04/18/2016  . History of attempted suicide 04/14/2016  . History of alcoholism (HCC) 04/14/2016  . History of substance abuse 04/14/2016  . Major depressive disorder, recurrent (HCC) 10/08/2015  . COPD (chronic obstructive pulmonary disease) (HCC) 10/04/2015  . Abnormal thyroid stimulating hormone (TSH) level 10/04/2015  . Severe episode of recurrent major depressive disorder, without psychotic features (HCC)   . MDD (major depressive disorder), recurrent episode, severe (HCC) 09/23/2015  . HTN (hypertension) 09/10/2015  . MDD (major depressive disorder), recurrent, severe, with psychosis (HCC) 08/07/2015  . PTSD (post-traumatic stress disorder) 08/07/2015  . Cocaine use disorder, severe, dependence (HCC) 08/07/2015  . Alcohol use disorder, severe, dependence (HCC) 08/07/2015  . Tobacco use disorder 08/07/2015  . Suicidal ideation   . Stimulant use disorder 05/29/2014  . Generalized anxiety disorder 05/29/2014  . Nondependent cannabis abuse, episodic 02/16/2014  . Polysubstance (excluding opioids) dependence with physiological dependence (HCC) 12/30/2013  . Substance induced mood disorder (HCC) 11/22/2013  . Cocaine abuse, episodic use (HCC) 03/20/2012    Class: Acute  . Alcohol dependence (HCC)  03/20/2012    Class: Acute  . Heart failure (HCC) 09/19/2011  . Neutropenia (HCC) 09/19/2011  . Chronic systolic heart failure (HCC)   . Cardiomyopathy, nonischemic (HCC) 08/24/2011  . Human immunodeficiency virus (HIV) disease (HCC) 02/01/2009  . Recurrent HSV (herpes simplex virus) 02/01/2009  . HYPERLIPIDEMIA 02/01/2009  . ALLERGIC RHINITIS 02/01/2009    Current Outpatient Medications:  .  aspirin 81 MG chewable tablet, Chew 1 tablet (81 mg total) by mouth every morning. For heart health, Disp: , Rfl:  .  azithromycin (ZITHROMAX) 250 MG tablet, Take 2 tabs (500 mg) on day 1 then 1 tab (250 mg) on days 2-5, Disp: 6 tablet, Rfl: 0 .  benzonatate (TESSALON) 100 MG capsule, Take 1 capsule (100 mg total) by mouth 2 (two) times daily as needed for cough., Disp: 20 capsule, Rfl: 0 .  bictegravir-emtricitabine-tenofovir AF (BIKTARVY) 50-200-25 MG TABS tablet, Take 1 tablet by mouth daily., Disp: 30 tablet, Rfl: 5 .  digoxin (DIGOX) 0.125 MG tablet, TAKE 1 TABLET BY MOUTH DAILY( FOR CONTROL OF HEART ARRYTHMIA), Disp: 30 tablet, Rfl: 3 .  eplerenone (INSPRA) 25 MG tablet, Take 0.5 tablets (12.5 mg total) by mouth daily., Disp: 45 tablet, Rfl: 3 .  furosemide (LASIX) 40 MG tablet, Take 1 tablet (40 mg total) by mouth daily., Disp: 90 tablet, Rfl: 1 .  hydrOXYzine (ATARAX/VISTARIL) 25 MG tablet, Take 1-2 tablets (25-50 mg total) by mouth at bedtime., Disp: 30 tablet, Rfl: 1 .  losartan (COZAAR) 50 MG tablet, TAKE 1 TABLET(50 MG) BY MOUTH DAILY, Disp: 90 tablet, Rfl: 1 .  metoprolol succinate (TOPROL-XL) 50 MG 24 hr tablet, Take 1 tablet (50 mg total) by mouth at bedtime. For high blood pressure, Disp: 90 tablet, Rfl: 1 .  Multiple Vitamins-Minerals (MULTIVITAMIN  WITH MINERALS) tablet, Take 1 tablet by mouth daily., Disp: , Rfl:  .  potassium chloride SA (K-DUR,KLOR-CON) 20 MEQ tablet, Take 1 tablet (20 mEq total) by mouth daily., Disp: 90 tablet, Rfl: 1 .  sildenafil (VIAGRA) 50 MG tablet, Take 1  tablet (50 mg total) by mouth daily as needed for erectile dysfunction. At least 24 hours between doses, Disp: 20 tablet, Rfl: 0 .  valACYclovir (VALTREX) 500 MG tablet, Take 1 tablet (500 mg total) by mouth daily. For Herpes., Disp: 30 tablet, Rfl: 1 .  cephALEXin (KEFLEX) 500 MG capsule, Take 1 capsule (500 mg total) by mouth 3 (three) times daily. (Patient not taking: Reported on 01/21/2018), Disp: 21 capsule, Rfl: 0 .  VENTOLIN HFA 108 (90 Base) MCG/ACT inhaler, INHALE 2 PUFFS INTO THE LUNGS EVERY 6 HOURS AS NEEDED FOR WHEEZING OR SHORTNESS OF BREATH, Disp: 18 g, Rfl: 1 No Known Allergies    Social History   Socioeconomic History  . Marital status: Single    Spouse name: Not on file  . Number of children: Not on file  . Years of education: Not on file  . Highest education level: Not on file  Social Needs  . Financial resource strain: Not on file  . Food insecurity - worry: Not on file  . Food insecurity - inability: Not on file  . Transportation needs - medical: Not on file  . Transportation needs - non-medical: Not on file  Occupational History  . Not on file  Tobacco Use  . Smoking status: Current Every Day Smoker    Packs/day: 0.50    Years: 38.00    Pack years: 19.00    Types: Cigarettes  . Smokeless tobacco: Never Used  Substance and Sexual Activity  . Alcohol use: Yes    Alcohol/week: 3.6 oz    Types: 6 Cans of beer per week    Comment: 01/08/2018 "stopped 06/2017"  . Drug use: Yes    Types: Cocaine    Comment: 01/08/2018 "stopped 06/2017"  . Sexual activity: Yes    Birth control/protection: Condom    Comment: pt. given condoms  Other Topics Concern  . Not on file  Social History Narrative  . Not on file    Physical Exam  Constitutional: He is oriented to person, place, and time.  Cardiovascular: Normal rate and regular rhythm.  Pulmonary/Chest: Effort normal. He has wheezes. He has no rales.  Abdominal: Soft. He exhibits no distension.  Musculoskeletal:  Normal range of motion. He exhibits edema.  Neurological: He is alert and oriented to person, place, and time.  Skin: Skin is warm and dry.  Psychiatric: He has a normal mood and affect.        Future Appointments  Date Time Provider Department Center  01/31/2018 12:00 PM [facility] 1 [facility]  02/04/2018  9:30 AM Hoy Register, MD [facility] None  02/25/2018  9:30 AM [facility] PA/NP [facility] None  04/10/2018 10:30 AM Regan Lemming, MD [facility]  09/02/2018  9:45 AM [facility]  09/16/2018  9:45 AM Daiva Eves, Lisette Grinder, MD [facility]    BP 110/74 (BP Location: Left Arm, Patient Position: Sitting, Cuff Size: Large)   Pulse 84   Resp 16   Wt 205 lb 3.2 oz (93.1 kg)   SpO2 98%   BMI 30.30 kg/m   Weight yesterday- did not weigh Last visit weight- N/A  Ryan Long was seen at home today for the first time since his re-referral.  He reported feeling well, denying headaches, dizziness of orthopnea. He was slightly SOB but was smoking when I arrived and had wheezing in all fields. I encouraged him to stop smoking and told him that he needed to use his MDI for the wheezing. He was agreeable and complied with these requests. His medication was verified and his pillbox was refilled. He is living in a new home now which seems to be a good fit for him. He was upset that the pilot light on the water heater had gone out but I told him to speak to his room mate (who is also the landlord) and have him fix the issue. This calmed him down and he acknowledged that getting angry was not productive. I assisted him in setting up his TV during our visit s he would feel more "at home." He also was without a scale until today because he lost his other scale when he moved.   Time spent with patient: 50 minutes  Jacqualine Code, EMT 01/25/18  ACTION: Home visit completed Next visit planned for 1 week

## 2018-01-31 ENCOUNTER — Telehealth: Payer: Self-pay | Admitting: Cardiology

## 2018-01-31 ENCOUNTER — Ambulatory Visit: Payer: Medicare Other

## 2018-01-31 NOTE — Telephone Encounter (Signed)
Patient reports that he has had no heat or hot water at his new residence and "has stuff going on" and therefore cannot make his appointments. He has cancelled/no-showed several wound check appointments. He asked if he could shower- I advised him that I cannot make recommendations for bathing as he has not had his wound re-evaluated. He asks about sleeping on his left side- I advised him that he could if it was comfortable. I instructed him about activity restrictions post ICD implant x 6weeks. He verbalizes understanding and reports that he will be at the appointment with Dr. Elberta Fortis 02/12/18.

## 2018-01-31 NOTE — Telephone Encounter (Signed)
New message   Patient states he has a little pain at device site.  No bleeding.    1. Has your device fired? NO  2. Is you device beeping? NO  3. Are you experiencing draining or swelling at device site? A "little" pain  4. Are you calling to see if we received your device transmission? No  5. Have you passed out? no    Please route to Device Clinic Pool

## 2018-02-01 ENCOUNTER — Other Ambulatory Visit (HOSPITAL_COMMUNITY): Payer: Self-pay

## 2018-02-01 NOTE — Progress Notes (Signed)
Paramedicine Encounter    Patient ID: Ryan Long, male    DOB: 05/28/1961, 57 y.o.   MRN: [id number]   Patient Care Team: Hoy Register, MD as PCP - General (Family Medicine) Regan Lemming, MD as Consulting Physician (Cardiology)  Patient Active Problem List   Diagnosis Date Noted  . NICM (nonischemic cardiomyopathy) (HCC) 01/08/2018  . Anxiety 12/20/2017  . Bipolar 1 disorder (HCC) 10/10/2017  . Breast lump 10/03/2017  . Insomnia 07/16/2017  . Genital herpes 05/13/2017  . HSV-2 infection 07/17/2016  . 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 Screening examination for venereal disease 04/18/2016  . Encounter for long-term (current) use of medications 04/18/2016  . History of attempted suicide 04/14/2016  . History of alcoholism (HCC) 04/14/2016  . History of substance abuse 04/14/2016  . Major depressive disorder, recurrent (HCC) 10/08/2015  . COPD (chronic obstructive pulmonary disease) (HCC) 10/04/2015  . Abnormal thyroid stimulating hormone (TSH) level 10/04/2015  . Severe episode of recurrent major depressive disorder, without psychotic features (HCC)   . MDD (major depressive disorder), recurrent episode, severe (HCC) 09/23/2015  . HTN (hypertension) 09/10/2015  . MDD (major depressive disorder), recurrent, severe, with psychosis (HCC) 08/07/2015  . PTSD (post-traumatic stress disorder) 08/07/2015  . Cocaine use disorder, severe, dependence (HCC) 08/07/2015  . Alcohol use disorder, severe, dependence (HCC) 08/07/2015  . Tobacco use disorder 08/07/2015  . Suicidal ideation   . Stimulant use disorder 05/29/2014  . Generalized anxiety disorder 05/29/2014  . Nondependent cannabis abuse, episodic 02/16/2014  . Polysubstance (excluding opioids) dependence with physiological dependence (HCC) 12/30/2013  . Substance induced mood disorder (HCC) 11/22/2013  . Cocaine abuse, episodic use (HCC)  03/20/2012    Class: Acute  . Alcohol dependence (HCC) 03/20/2012    Class: Acute  . Heart failure (HCC) 09/19/2011  . Neutropenia (HCC) 09/19/2011  . Chronic systolic heart failure (HCC)   . Cardiomyopathy, nonischemic (HCC) 08/24/2011  . Human immunodeficiency virus (HIV) disease (HCC) 02/01/2009  . Recurrent HSV (herpes simplex virus) 02/01/2009  . HYPERLIPIDEMIA 02/01/2009  . ALLERGIC RHINITIS 02/01/2009    Current Outpatient Medications:  .  aspirin 81 MG chewable tablet, Chew 1 tablet (81 mg total) by mouth every morning. For heart health, Disp: , Rfl:  .  bictegravir-emtricitabine-tenofovir AF (BIKTARVY) 50-200-25 MG TABS tablet, Take 1 tablet by mouth daily., Disp: 30 tablet, Rfl: 5 .  digoxin (DIGOX) 0.125 MG tablet, TAKE 1 TABLET BY MOUTH DAILY( FOR CONTROL OF HEART ARRYTHMIA), Disp: 30 tablet, Rfl: 3 .  eplerenone (INSPRA) 25 MG tablet, Take 0.5 tablets (12.5 mg total) by mouth daily., Disp: 45 tablet, Rfl: 3 .  furosemide (LASIX) 40 MG tablet, Take 1 tablet (40 mg total) by mouth daily., Disp: 90 tablet, Rfl: 1 .  hydrOXYzine (ATARAX/VISTARIL) 25 MG tablet, Take 1-2 tablets (25-50 mg total) by mouth at bedtime., Disp: 30 tablet, Rfl: 1 .  losartan (COZAAR) 50 MG tablet, TAKE 1 TABLET(50 MG) BY MOUTH DAILY, Disp: 90 tablet, Rfl: 1 .  metoprolol succinate (TOPROL-XL) 50 MG 24 hr tablet, Take 1 tablet (50 mg total) by mouth at bedtime. For high blood pressure, Disp: 90 tablet, Rfl: 1 .  Multiple Vitamins-Minerals (MULTIVITAMIN WITH MINERALS) tablet, Take 1 tablet by mouth daily., Disp: , Rfl:  .  potassium chloride SA (K-DUR,KLOR-CON) 20 MEQ tablet, Take 1 tablet (20 mEq total) by mouth daily., Disp: 90 tablet, Rfl: 1 .  sildenafil (VIAGRA) 50 MG tablet, Take 1 tablet (50 mg  total) by mouth daily as needed for erectile dysfunction. At least 24 hours between doses, Disp: 20 tablet, Rfl: 0 .  valACYclovir (VALTREX) 500 MG tablet, Take 1 tablet (500 mg total) by mouth daily. For  Herpes., Disp: 30 tablet, Rfl: 1 .  VENTOLIN HFA 108 (90 Base) MCG/ACT inhaler, INHALE 2 PUFFS INTO THE LUNGS EVERY 6 HOURS AS NEEDED FOR WHEEZING OR SHORTNESS OF BREATH, Disp: 18 g, Rfl: 1 .  azithromycin (ZITHROMAX) 250 MG tablet, Take 2 tabs (500 mg) on day 1 then 1 tab (250 mg) on days 2-5 (Patient not taking: Reported on 02/01/2018), Disp: 6 tablet, Rfl: 0 .  benzonatate (TESSALON) 100 MG capsule, Take 1 capsule (100 mg total) by mouth 2 (two) times daily as needed for cough. (Patient not taking: Reported on 02/01/2018), Disp: 20 capsule, Rfl: 0 .  cephALEXin (KEFLEX) 500 MG capsule, Take 1 capsule (500 mg total) by mouth 3 (three) times daily. (Patient not taking: Reported on 01/21/2018), Disp: 21 capsule, Rfl: 0 No Known Allergies    Social History   Socioeconomic History  . Marital status: Single    Spouse name: Not on file  . Number of children: Not on file  . Years of education: Not on file  . Highest education level: Not on file  Social Needs  . Financial resource strain: Not on file  . Food insecurity - worry: Not on file  . Food insecurity - inability: Not on file  . Transportation needs - medical: Not on file  . Transportation needs - non-medical: Not on file  Occupational History  . Not on file  Tobacco Use  . Smoking status: Current Every Day Smoker    Packs/day: 0.50    Years: 38.00    Pack years: 19.00    Types: Cigarettes  . Smokeless tobacco: Never Used  Substance and Sexual Activity  . Alcohol use: Yes    Alcohol/week: 3.6 oz    Types: 6 Cans of beer per week    Comment: 01/08/2018 "stopped 06/2017"  . Drug use: Yes    Types: Cocaine    Comment: 01/08/2018 "stopped 06/2017"  . Sexual activity: Yes    Birth control/protection: Condom    Comment: pt. given condoms  Other Topics Concern  . Not on file  Social History Narrative  . Not on file    Physical Exam  Constitutional: He is oriented to person, place, and time.  Cardiovascular: Normal rate and  regular rhythm.  Pulmonary/Chest: Effort normal and breath sounds normal. No respiratory distress. He has no wheezes. He has no rales.  Abdominal: Soft.  Musculoskeletal: Normal range of motion. He exhibits no edema.  Neurological: He is alert and oriented to person, place, and time.  Skin: Skin is warm and dry.  Psychiatric: He has a normal mood and affect.        Future Appointments  Date Time Provider Department Center  02/04/2018  9:30 AM Hoy Register, MD [facility] None  02/12/2018 10:30 AM Regan Lemming, MD [facility]  02/25/2018  9:30 AM [facility] PA/NP [facility] None  04/10/2018 10:30 AM Regan Lemming, MD [facility]  09/02/2018  9:45 AM [facility]  09/16/2018  9:45 AM Daiva Eves, Lisette Grinder, MD [facility]    BP 110/80 (BP Location: Left Arm, Patient Position: Sitting, Cuff Size: Large)   Pulse 100   Resp 18   Wt 198 lb 9.6 oz (90.1 kg)   SpO2 98%   BMI 29.33 kg/m  Weight yesterday- 200.2 lb lb Last visit weight- 205 lb  Mr Costner was seen at home today and reported feeling well. He denied SOB, headache, dizziness and orthopnea. He has been taking his medications wit a few exceptions. He missed Wednesday evening and did not take 3 doses of furosemide. He was not able to give a reason about why he missed these medications but he said he would do better. His medications were verified and his pillbox was refilled.   Time spent with patient: 28 minutes  Jacqualine Code, EMT 02/01/18  ACTION: Home visit completed Next visit planned for 1 week

## 2018-02-04 ENCOUNTER — Ambulatory Visit: Payer: Medicare Other | Admitting: Family Medicine

## 2018-02-05 ENCOUNTER — Other Ambulatory Visit (HOSPITAL_COMMUNITY): Payer: Self-pay

## 2018-02-05 NOTE — Progress Notes (Signed)
NOTE: This should be a telephone encounter. Ryan Long called me to advise he was unhappy with his current living situation and was wanting to move out. I asked why and he said the landlord is planning to sell the house and there are electrical problems. I advised he not leave the house without having anywhere to go. He said he had called [facility] but they did not have a bed for him so I suggested he call [employer] in [place]. He was agreeable to this.

## 2018-02-07 ENCOUNTER — Encounter: Payer: Self-pay | Admitting: Cardiology

## 2018-02-07 ENCOUNTER — Telehealth: Payer: Self-pay

## 2018-02-07 ENCOUNTER — Telehealth (HOSPITAL_COMMUNITY): Payer: Self-pay

## 2018-02-07 NOTE — Telephone Encounter (Signed)
Patient called today asking when it would be safe to shower. Patient has been non-compliant with follow up and has canceled or no-showed all follow up wound checks. He reports that the steri strips are beginning to peel up on the corners. I stressed the importance of coming to his follow up appointments and the importance of having a nurse evaluate his incision and check his defibrillator. He proceeded to discuss his housing situation and states that he is living in condemned housing, is unsure of where he is going to move to and feels that because of this he won't be able to come in to the office at this time. I educated patient on the importance of calling 911 if he receives a shock from is defibrillator and again stressed the importance of coming in for a wound check/defib check. He was insistent that he would not be able to come into the office this time giving vague commitments to possible upcoming appointments.

## 2018-02-07 NOTE — Telephone Encounter (Signed)
Ryan Long called me today to let me know he would be moving out of his house tomorrow. He stated the power company would be turning off the power to the house where he was renting a room first thing tomorrow morning. He reported he does not know where he is going to live after he leaves that place. I told him to contact [facility], [employer] or [facility] to see if they have a be available. He stated he had spoken to [facility] and [facility] but not [facility] [employer] but he would call them. As a result of the impending move, he was unsure when or where he could meet me next. He said he would call me tomorrow once he "has something figured out." I have spoken to [place] about this and she said she had received a voicemail from him today relaying the same information.

## 2018-02-08 DIAGNOSIS — F329 Major depressive disorder, single episode, unspecified: Secondary | ICD-10-CM | POA: Diagnosis not present

## 2018-02-08 DIAGNOSIS — R45851 Suicidal ideations: Secondary | ICD-10-CM | POA: Diagnosis not present

## 2018-02-08 DIAGNOSIS — F321 Major depressive disorder, single episode, moderate: Secondary | ICD-10-CM | POA: Diagnosis not present

## 2018-02-08 DIAGNOSIS — Z79899 Other long term (current) drug therapy: Secondary | ICD-10-CM | POA: Diagnosis not present

## 2018-02-08 DIAGNOSIS — Z21 Asymptomatic human immunodeficiency virus [HIV] infection status: Secondary | ICD-10-CM | POA: Diagnosis not present

## 2018-02-08 DIAGNOSIS — F17210 Nicotine dependence, cigarettes, uncomplicated: Secondary | ICD-10-CM | POA: Diagnosis not present

## 2018-02-09 DIAGNOSIS — Z79899 Other long term (current) drug therapy: Secondary | ICD-10-CM | POA: Diagnosis not present

## 2018-02-09 DIAGNOSIS — I4891 Unspecified atrial fibrillation: Secondary | ICD-10-CM | POA: Diagnosis not present

## 2018-02-09 DIAGNOSIS — I2510 Atherosclerotic heart disease of native coronary artery without angina pectoris: Secondary | ICD-10-CM | POA: Diagnosis not present

## 2018-02-09 DIAGNOSIS — N529 Male erectile dysfunction, unspecified: Secondary | ICD-10-CM | POA: Diagnosis present

## 2018-02-09 DIAGNOSIS — I110 Hypertensive heart disease with heart failure: Secondary | ICD-10-CM | POA: Diagnosis present

## 2018-02-09 DIAGNOSIS — Z915 Personal history of self-harm: Secondary | ICD-10-CM | POA: Diagnosis not present

## 2018-02-09 DIAGNOSIS — Z62810 Personal history of physical and sexual abuse in childhood: Secondary | ICD-10-CM | POA: Diagnosis present

## 2018-02-09 DIAGNOSIS — Z590 Homelessness: Secondary | ICD-10-CM | POA: Diagnosis not present

## 2018-02-09 DIAGNOSIS — F17210 Nicotine dependence, cigarettes, uncomplicated: Secondary | ICD-10-CM | POA: Diagnosis present

## 2018-02-09 DIAGNOSIS — Z21 Asymptomatic human immunodeficiency virus [HIV] infection status: Secondary | ICD-10-CM | POA: Diagnosis present

## 2018-02-09 DIAGNOSIS — I509 Heart failure, unspecified: Secondary | ICD-10-CM | POA: Diagnosis present

## 2018-02-09 DIAGNOSIS — Z95810 Presence of automatic (implantable) cardiac defibrillator: Secondary | ICD-10-CM | POA: Diagnosis not present

## 2018-02-09 DIAGNOSIS — R45851 Suicidal ideations: Secondary | ICD-10-CM | POA: Diagnosis present

## 2018-02-09 DIAGNOSIS — B20 Human immunodeficiency virus [HIV] disease: Secondary | ICD-10-CM | POA: Diagnosis not present

## 2018-02-09 DIAGNOSIS — F329 Major depressive disorder, single episode, unspecified: Secondary | ICD-10-CM | POA: Diagnosis not present

## 2018-02-09 DIAGNOSIS — F319 Bipolar disorder, unspecified: Secondary | ICD-10-CM | POA: Diagnosis not present

## 2018-02-09 DIAGNOSIS — F332 Major depressive disorder, recurrent severe without psychotic features: Secondary | ICD-10-CM | POA: Diagnosis not present

## 2018-02-12 ENCOUNTER — Encounter: Payer: Self-pay | Admitting: Cardiology

## 2018-02-12 ENCOUNTER — Ambulatory Visit: Payer: Medicare Other | Admitting: Cardiology

## 2018-02-14 ENCOUNTER — Telehealth (HOSPITAL_COMMUNITY): Payer: Self-pay

## 2018-02-14 NOTE — Telephone Encounter (Signed)
I called Ryan Long to schedule an appointment after not hearing from him since last week. He originally told me he would call me Friday of last week but the day came and went without a word. Today, his phone went directly to voicemail without ringing at all. I left a message requesting he call me back so we can schedule an appointment.

## 2018-02-20 ENCOUNTER — Telehealth (HOSPITAL_COMMUNITY): Payer: Self-pay | Admitting: Surgery

## 2018-02-20 ENCOUNTER — Telehealth (HOSPITAL_COMMUNITY): Payer: Self-pay

## 2018-02-20 NOTE — Telephone Encounter (Signed)
I called Ryan Long to schedule an appointment. His phone went directly to voicemail which has been the case for the last couple of weeks. I left a voicemail requesting he call me back. I will discuss options with the clinic regarding this issue.

## 2018-02-20 NOTE — Telephone Encounter (Signed)
Patient will be discharged from the [facility] due to inability to make contact and schedule home visits.

## 2018-02-23 ENCOUNTER — Emergency Department (HOSPITAL_COMMUNITY)
Admission: EM | Admit: 2018-02-23 | Discharge: 2018-02-23 | Disposition: A | Discharge status: Home / Self Care | Payer: Medicare Other | Attending: Emergency Medicine | Admitting: Emergency Medicine

## 2018-02-23 ENCOUNTER — Encounter (HOSPITAL_COMMUNITY): Payer: Self-pay | Admitting: *Deleted

## 2018-02-23 DIAGNOSIS — Z95810 Presence of automatic (implantable) cardiac defibrillator: Secondary | ICD-10-CM | POA: Insufficient documentation

## 2018-02-23 DIAGNOSIS — F17210 Nicotine dependence, cigarettes, uncomplicated: Secondary | ICD-10-CM | POA: Insufficient documentation

## 2018-02-23 DIAGNOSIS — R1030 Lower abdominal pain, unspecified: Secondary | ICD-10-CM | POA: Insufficient documentation

## 2018-02-23 DIAGNOSIS — Z7982 Long term (current) use of aspirin: Secondary | ICD-10-CM | POA: Diagnosis not present

## 2018-02-23 DIAGNOSIS — Z711 Person with feared health complaint in whom no diagnosis is made: Secondary | ICD-10-CM

## 2018-02-23 DIAGNOSIS — I509 Heart failure, unspecified: Secondary | ICD-10-CM | POA: Insufficient documentation

## 2018-02-23 DIAGNOSIS — I110 Hypertensive heart disease with heart failure: Secondary | ICD-10-CM | POA: Diagnosis not present

## 2018-02-23 DIAGNOSIS — Z113 Encounter for screening for infections with a predominantly sexual mode of transmission: Secondary | ICD-10-CM | POA: Diagnosis not present

## 2018-02-23 DIAGNOSIS — F1410 Cocaine abuse, uncomplicated: Secondary | ICD-10-CM | POA: Insufficient documentation

## 2018-02-23 DIAGNOSIS — B20 Human immunodeficiency virus [HIV] disease: Secondary | ICD-10-CM | POA: Diagnosis not present

## 2018-02-23 DIAGNOSIS — Z202 Contact with and (suspected) exposure to infections with a predominantly sexual mode of transmission: Secondary | ICD-10-CM | POA: Diagnosis not present

## 2018-02-23 DIAGNOSIS — B009 Herpesviral infection, unspecified: Secondary | ICD-10-CM

## 2018-02-23 DIAGNOSIS — J449 Chronic obstructive pulmonary disease, unspecified: Secondary | ICD-10-CM | POA: Diagnosis not present

## 2018-02-23 DIAGNOSIS — Z79899 Other long term (current) drug therapy: Secondary | ICD-10-CM | POA: Insufficient documentation

## 2018-02-23 DIAGNOSIS — R300 Dysuria: Secondary | ICD-10-CM | POA: Diagnosis not present

## 2018-02-23 LAB — URINALYSIS, ROUTINE W REFLEX MICROSCOPIC
Bilirubin Urine: NEGATIVE
Glucose, UA: NEGATIVE mg/dL
Hgb urine dipstick: NEGATIVE
Ketones, ur: NEGATIVE mg/dL
Leukocytes, UA: NEGATIVE
Nitrite: NEGATIVE
Protein, ur: NEGATIVE mg/dL
Specific Gravity, Urine: 1.011 (ref 1.005–1.030)
pH: 6 (ref 5.0–8.0)

## 2018-02-23 LAB — RPR: RPR Ser Ql: NONREACTIVE

## 2018-02-23 MED ORDER — STERILE WATER FOR INJECTION IJ SOLN
INTRAMUSCULAR | Status: AC
  Administered 2018-02-23: 1.2 mL
  Filled 2018-02-23: qty 10

## 2018-02-23 MED ORDER — CEFTRIAXONE SODIUM 250 MG IJ SOLR
250.0000 mg | Freq: Once | INTRAMUSCULAR | Status: AC
  Administered 2018-02-23: 250 mg via INTRAMUSCULAR
  Filled 2018-02-23: qty 250

## 2018-02-23 MED ORDER — VALACYCLOVIR HCL 500 MG PO TABS: 500.0000 mg | ORAL_TABLET | Freq: Every day | ORAL | 1 refills | Status: DC

## 2018-02-23 MED ORDER — AZITHROMYCIN 250 MG PO TABS
1000.0000 mg | ORAL_TABLET | Freq: Once | ORAL | Status: AC
  Administered 2018-02-23: 1000 mg via ORAL
  Filled 2018-02-23: qty 4

## 2018-02-23 NOTE — ED Triage Notes (Signed)
To ED for eval/treatment after being exposed to STD. Complains of penile discharge and burning.

## 2018-02-23 NOTE — ED Provider Notes (Addendum)
[facility] EMERGENCY DEPARTMENT Provider Note   CSN: [id number] Arrival date & time: 02/23/18  7829     History   Chief Complaint Chief Complaint  Patient presents with  . Exposure to STD    HPI Ryan Long is a 57 y.o. male with history of HIV, COPD, chronic systolic heart failure, cardiomyopathy with AICD, HTN, HLD, and genital herpes presents today for evaluation of acute onset of dysuria since yesterday.  He also notes mild aching right groin pain.  He notes that he had unprotected sex with a new male partner last week.  He denies penile drainage, testicular swelling or pain, or pain with bowel movements.  He denies hematuria, urgency, or frequency.  No fevers, chills, chest pain, shortness of breath, abdominal pain, nausea, or vomiting.  He has been compliant with his HIV medications and states that he has not missed any doses.  He is also requesting refills of his maintenance valacyclovir and denies any new genital lesions.  He is followed by Dr. Algis Liming with infectious disease and has yearly follow up in September.  The history is provided by the patient.    Past Medical History:  Diagnosis Date  . Active smoker   . AICD (automatic cardioverter/defibrillator) present   . Alcohol abuse   . Anxiety   . AR (allergic rhinitis)   . Bipolar 1 disorder (HCC)   . CHF (congestive heart failure) (HCC)   . Chronic bronchitis (HCC)   . Chronic systolic heart failure (HCC)   . COPD (chronic obstructive pulmonary disease) (HCC)   . Crack cocaine use   . Depression   . Genital herpes   . HIV (human immunodeficiency virus infection) (HCC) dx'd 08/1993  . HLD (hyperlipidemia)   . Hypertension   . NICM (nonischemic cardiomyopathy) (HCC)    Echocardiogram 06/28/11: EF 30-35%, mild MR, mild LAE;  No CAD by coronary CT angiogram 3/12 at [facility]  . NSVT (nonsustained ventricular tachycardia) (HCC)   . PTSD (post-traumatic stress disorder)      Patient Active Problem List   Diagnosis Date Noted  . NICM (nonischemic cardiomyopathy) (HCC) 01/08/2018  . Anxiety 12/20/2017  . Bipolar 1 disorder (HCC) 10/10/2017  . Breast lump 10/03/2017  . Insomnia 07/16/2017  . Genital herpes 05/13/2017  . HSV-2 infection 07/17/2016  . 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 Screening examination for venereal disease 04/18/2016  . Encounter for long-term (current) use of medications 04/18/2016  . History of attempted suicide 04/14/2016  . History of alcoholism (HCC) 04/14/2016  . History of substance abuse 04/14/2016  . Major depressive disorder, recurrent (HCC) 10/08/2015  . COPD (chronic obstructive pulmonary disease) (HCC) 10/04/2015  . Abnormal thyroid stimulating hormone (TSH) level 10/04/2015  . Severe episode of recurrent major depressive disorder, without psychotic features (HCC)   . MDD (major depressive disorder), recurrent episode, severe (HCC) 09/23/2015  . HTN (hypertension) 09/10/2015  . MDD (major depressive disorder), recurrent, severe, with psychosis (HCC) 08/07/2015  . PTSD (post-traumatic stress disorder) 08/07/2015  . Cocaine use disorder, severe, dependence (HCC) 08/07/2015  . Alcohol use disorder, severe, dependence (HCC) 08/07/2015  . Tobacco use disorder 08/07/2015  . Suicidal ideation   . Stimulant use disorder 05/29/2014  . Generalized anxiety disorder 05/29/2014  . Nondependent cannabis abuse, episodic 02/16/2014  . Polysubstance (excluding opioids) dependence with physiological dependence (HCC) 12/30/2013  . Substance induced mood disorder (HCC) 11/22/2013  . Cocaine abuse, episodic use (HCC)  03/20/2012    Class: Acute  . Alcohol dependence (HCC) 03/20/2012    Class: Acute  . Heart failure (HCC) 09/19/2011  . Neutropenia (HCC) 09/19/2011  . Chronic systolic heart failure (HCC)   . Cardiomyopathy, nonischemic (HCC) 08/24/2011  . Human  immunodeficiency virus (HIV) disease (HCC) 02/01/2009  . Recurrent HSV (herpes simplex virus) 02/01/2009  . HYPERLIPIDEMIA 02/01/2009  . ALLERGIC RHINITIS 02/01/2009    Past Surgical History:  Procedure Laterality Date  . CARDIAC DEFIBRILLATOR PLACEMENT  01/08/2018  . ICD IMPLANT N/A 01/08/2018   Procedure: ICD IMPLANT;  Surgeon: Regan Lemming, MD;  Location: [facility] INVASIVE CV LAB;  Service: Cardiovascular;  Laterality: N/A;       Home Medications    Prior to Admission medications   Medication Sig Start Date End Date Taking? Authorizing Provider  aspirin 81 MG chewable tablet Chew 1 tablet (81 mg total) by mouth every morning. For heart health 09/28/15   Adonis Brook, NP  azithromycin (ZITHROMAX) 250 MG tablet Take 2 tabs (500 mg) on day 1 then 1 tab (250 mg) on days 2-5 Patient not taking: Reported on 02/01/2018 01/21/18   Hoy Register, MD  benzonatate (TESSALON) 100 MG capsule Take 1 capsule (100 mg total) by mouth 2 (two) times daily as needed for cough. Patient not taking: Reported on 02/01/2018 01/21/18   Hoy Register, MD  bictegravir-emtricitabine-tenofovir AF (BIKTARVY) 50-200-25 MG TABS tablet Take 1 tablet by mouth daily. 09/12/17   Randall Hiss, MD  cephALEXin (KEFLEX) 500 MG capsule Take 1 capsule (500 mg total) by mouth 3 (three) times daily. Patient not taking: Reported on 01/21/2018 01/14/18   [name] D, NP  digoxin (DIGOX) 0.125 MG tablet TAKE 1 TABLET BY MOUTH DAILY( FOR CONTROL OF HEART ARRYTHMIA) 01/07/18   Bensimhon, Bevelyn Buckles, MD  eplerenone (INSPRA) 25 MG tablet Take 0.5 tablets (12.5 mg total) by mouth daily. 01/07/18   Bensimhon, Bevelyn Buckles, MD  furosemide (LASIX) 40 MG tablet Take 1 tablet (40 mg total) by mouth daily. 01/07/18   Bensimhon, Bevelyn Buckles, MD  hydrOXYzine (ATARAX/VISTARIL) 25 MG tablet Take 1-2 tablets (25-50 mg total) by mouth at bedtime. 01/07/18   Bensimhon, Bevelyn Buckles, MD  losartan (COZAAR) 50 MG tablet TAKE 1 TABLET(50 MG) BY MOUTH DAILY  01/07/18   Bensimhon, Bevelyn Buckles, MD  metoprolol succinate (TOPROL-XL) 50 MG 24 hr tablet Take 1 tablet (50 mg total) by mouth at bedtime. For high blood pressure 01/07/18   Bensimhon, Bevelyn Buckles, MD  Multiple Vitamins-Minerals (MULTIVITAMIN WITH MINERALS) tablet Take 1 tablet by mouth daily.    [provider]  potassium chloride SA (K-DUR,KLOR-CON) 20 MEQ tablet Take 1 tablet (20 mEq total) by mouth daily. 01/07/18   Bensimhon, Bevelyn Buckles, MD  sildenafil (VIAGRA) 50 MG tablet Take 1 tablet (50 mg total) by mouth daily as needed for erectile dysfunction. At least 24 hours between doses 01/21/18   Hoy Register, MD  valACYclovir (VALTREX) 500 MG tablet Take 1 tablet (500 mg total) by mouth daily. For Herpes. 02/23/18   Viana Sleep A, PA-C  VENTOLIN HFA 108 (90 Base) MCG/ACT inhaler INHALE 2 PUFFS INTO THE LUNGS EVERY 6 HOURS AS NEEDED FOR WHEEZING OR SHORTNESS OF BREATH 08/13/17   Hoy Register, MD    Family History Family History  Problem Relation Age of Onset  . Hypertension Father   . CAD Father     Social History Social History   Tobacco Use  . Smoking status: Current Every Day  Smoker    Packs/day: 0.50    Years: 38.00    Pack years: 19.00    Types: Cigarettes  . Smokeless tobacco: Never Used  Substance Use Topics  . Alcohol use: Yes    Alcohol/week: 3.6 oz    Types: 6 Cans of beer per week    Comment: 01/08/2018 "stopped 06/2017"  . Drug use: Yes    Types: Cocaine    Comment: 01/08/2018 "stopped 06/2017"     Allergies   Patient has no known allergies.   Review of Systems Review of Systems  Constitutional: Negative for chills and fever.  Respiratory: Negative for shortness of breath.   Cardiovascular: Negative for chest pain.  Gastrointestinal: Negative for abdominal pain, blood in stool, constipation, diarrhea, rectal pain and vomiting.  Genitourinary: Positive for dysuria. Negative for discharge, frequency, genital sores, hematuria, penile pain, penile swelling and  urgency.  All other systems reviewed and are negative.    Physical Exam Updated Vital Signs BP 120/70 (BP Location: Right Arm)   Pulse 78   Temp 98 F (36.7 C) (Oral)   Resp 16   SpO2 99%   Physical Exam  Constitutional: He appears well-developed and well-nourished. No distress.  HENT:  Head: Normocephalic and atraumatic.  Eyes: Conjunctivae are normal. Right eye exhibits no discharge. Left eye exhibits no discharge.  Neck: No JVD present. No tracheal deviation present.  Cardiovascular: Normal rate and regular rhythm.  Pulmonary/Chest: Effort normal and breath sounds normal.  Abdominal: Soft. Bowel sounds are normal. He exhibits no distension. There is no tenderness. There is no guarding.  Genitourinary:  Genitourinary Comments: No CVA tenderness. Examination performed under the presence of a chaperone. Right sided inguinal lymphadenopathy with no erythema or induration, no scrotal swelling, no tenderness to palpation of the testes or epididymides, no masses, no genital lesions; penis is without erythema, drainage, tenderness, or swelling.   Musculoskeletal: He exhibits no edema.  Neurological: He is alert.  Skin: Skin is warm and dry. No erythema.  Psychiatric: He has a normal mood and affect. His behavior is normal.  Nursing note and vitals reviewed.    ED Treatments / Results  Labs (all labs ordered are listed, but only abnormal results are displayed) Labs Reviewed  URINALYSIS, ROUTINE W REFLEX MICROSCOPIC  RPR  GC/CHLAMYDIA PROBE AMP ([place]) NOT AT [facility]    EKG  EKG Interpretation None       Radiology No results found.  Procedures Procedures (including critical care time)  Medications Ordered in ED Medications  azithromycin (ZITHROMAX) tablet 1,000 mg (1,000 mg Oral Given 02/23/18 0813)  cefTRIAXone (ROCEPHIN) injection 250 mg (250 mg Intramuscular Given 02/23/18 0813)  sterile water (preservative free) injection (1.2 mLs  Given 02/23/18 0814)      Initial Impression / Assessment and Plan / ED Course  I have reviewed the triage vital signs and the nursing notes.  Pertinent labs & imaging results that were available during my care of the patient were reviewed by me and considered in my medical decision making (see chart for details).     Patient is afebrile without abdominal tenderness, abdominal pain or painful bowel movements to indicate prostatitis.  No tenderness to palpation of the testes or epididymis to suggest orchitis or epididymitis.  UA is not concerning for UTI or nephrolithiasis.  STD cultures obtained including syphilis, gonorrhea and chlamydia.  He is HIV positive but compliant with his medications and last CD4 count was within normal limits. Patient has been treated prophylactically with  azithromycin and Rocephin.  Will also refill his valacyclovir. Patient to be discharged with instructions to follow up with PCP. Discussed importance of using protection when sexually active. Pt understands that they have GC/Chlamydia cultures pending and that they will need to inform all sexual partners if results return positive.   Final Clinical Impressions(s) / ED Diagnoses   Final diagnoses:  Concern about STD in male without diagnosis    ED Discharge Orders        Ordered    valACYclovir (VALTREX) 500 MG tablet  Daily     02/23/18 0806          Jeanie Sewer, PA-C 02/23/18 5784    Arby Barrette, MD 02/25/18 [phone]

## 2018-02-23 NOTE — Discharge Instructions (Signed)
Follow up with [facility] STD clinic to be screened for HIV in the future and for future STD concerns or screenings. This is the recommendation by the CDC for people with multiple sexual partners or hx of STDs. You have been treated for gonorrhea and chlamydia in the ER but the hospital will call you if lab is positive.  ° °

## 2018-02-25 ENCOUNTER — Emergency Department (HOSPITAL_COMMUNITY)
Admission: EM | Admit: 2018-02-25 | Discharge: 2018-02-26 | Disposition: A | Discharge status: Home / Self Care | Payer: Medicare Other | Attending: Physician Assistant | Admitting: Physician Assistant

## 2018-02-25 ENCOUNTER — Encounter (HOSPITAL_COMMUNITY): Payer: Self-pay

## 2018-02-25 ENCOUNTER — Ambulatory Visit (HOSPITAL_BASED_OUTPATIENT_CLINIC_OR_DEPARTMENT_OTHER)
Admission: RE | Admit: 2018-02-25 | Discharge: 2018-02-25 | Disposition: A | Discharge status: Home / Self Care | Payer: Medicare Other | Source: Ambulatory Visit | Attending: Cardiology | Admitting: Cardiology

## 2018-02-25 ENCOUNTER — Encounter (HOSPITAL_COMMUNITY): Payer: Self-pay | Admitting: Emergency Medicine

## 2018-02-25 VITALS — BP 112/78 | HR 108 | Wt 207.8 lb

## 2018-02-25 DIAGNOSIS — L03313 Cellulitis of chest wall: Secondary | ICD-10-CM | POA: Diagnosis not present

## 2018-02-25 DIAGNOSIS — Z7982 Long term (current) use of aspirin: Secondary | ICD-10-CM | POA: Diagnosis not present

## 2018-02-25 DIAGNOSIS — Z590 Homelessness: Secondary | ICD-10-CM | POA: Diagnosis not present

## 2018-02-25 DIAGNOSIS — E785 Hyperlipidemia, unspecified: Secondary | ICD-10-CM

## 2018-02-25 DIAGNOSIS — F319 Bipolar disorder, unspecified: Secondary | ICD-10-CM

## 2018-02-25 DIAGNOSIS — R45851 Suicidal ideations: Secondary | ICD-10-CM | POA: Insufficient documentation

## 2018-02-25 DIAGNOSIS — B20 Human immunodeficiency virus [HIV] disease: Secondary | ICD-10-CM | POA: Insufficient documentation

## 2018-02-25 DIAGNOSIS — F17210 Nicotine dependence, cigarettes, uncomplicated: Secondary | ICD-10-CM | POA: Diagnosis not present

## 2018-02-25 DIAGNOSIS — I110 Hypertensive heart disease with heart failure: Secondary | ICD-10-CM | POA: Diagnosis not present

## 2018-02-25 DIAGNOSIS — I429 Cardiomyopathy, unspecified: Secondary | ICD-10-CM | POA: Insufficient documentation

## 2018-02-25 DIAGNOSIS — I428 Other cardiomyopathies: Secondary | ICD-10-CM | POA: Diagnosis not present

## 2018-02-25 DIAGNOSIS — I5022 Chronic systolic (congestive) heart failure: Secondary | ICD-10-CM

## 2018-02-25 DIAGNOSIS — Z720 Tobacco use: Secondary | ICD-10-CM

## 2018-02-25 DIAGNOSIS — F333 Major depressive disorder, recurrent, severe with psychotic symptoms: Secondary | ICD-10-CM | POA: Diagnosis not present

## 2018-02-25 DIAGNOSIS — I159 Secondary hypertension, unspecified: Secondary | ICD-10-CM

## 2018-02-25 DIAGNOSIS — Z95810 Presence of automatic (implantable) cardiac defibrillator: Secondary | ICD-10-CM

## 2018-02-25 DIAGNOSIS — Z79899 Other long term (current) drug therapy: Secondary | ICD-10-CM | POA: Diagnosis not present

## 2018-02-25 DIAGNOSIS — J449 Chronic obstructive pulmonary disease, unspecified: Secondary | ICD-10-CM | POA: Insufficient documentation

## 2018-02-25 DIAGNOSIS — F1099 Alcohol use, unspecified with unspecified alcohol-induced disorder: Secondary | ICD-10-CM | POA: Diagnosis not present

## 2018-02-25 DIAGNOSIS — F339 Major depressive disorder, recurrent, unspecified: Secondary | ICD-10-CM | POA: Diagnosis present

## 2018-02-25 DIAGNOSIS — F4325 Adjustment disorder with mixed disturbance of emotions and conduct: Secondary | ICD-10-CM | POA: Diagnosis present

## 2018-02-25 DIAGNOSIS — F4310 Post-traumatic stress disorder, unspecified: Secondary | ICD-10-CM | POA: Insufficient documentation

## 2018-02-25 DIAGNOSIS — F29 Unspecified psychosis not due to a substance or known physiological condition: Secondary | ICD-10-CM | POA: Diagnosis present

## 2018-02-25 LAB — COMPREHENSIVE METABOLIC PANEL
ALBUMIN: 4.1 g/dL (ref 3.5–5.0)
ALT: 26 U/L (ref 17–63)
ANION GAP: 8 (ref 5–15)
AST: 32 U/L (ref 15–41)
Alkaline Phosphatase: 48 U/L (ref 38–126)
BILIRUBIN TOTAL: 1.2 mg/dL (ref 0.3–1.2)
BUN: 11 mg/dL (ref 6–20)
CO2: 28 mmol/L (ref 22–32)
Calcium: 9.1 mg/dL (ref 8.9–10.3)
Chloride: 105 mmol/L (ref 101–111)
Creatinine, Ser: 1.4 mg/dL — ABNORMAL HIGH (ref 0.61–1.24)
GFR, EST NON AFRICAN AMERICAN: 55 mL/min — AB (ref >60)
GLUCOSE: 107 mg/dL — AB (ref 65–99)
POTASSIUM: 3.7 mmol/L (ref 3.5–5.1)
Sodium: 141 mmol/L (ref 135–145)
TOTAL PROTEIN: 7.1 g/dL (ref 6.5–8.1)

## 2018-02-25 LAB — RAPID URINE DRUG SCREEN, HOSP PERFORMED
Amphetamines: NOT DETECTED
BENZODIAZEPINES: NOT DETECTED
Barbiturates: NOT DETECTED
COCAINE: NOT DETECTED
Opiates: NOT DETECTED
Tetrahydrocannabinol: NOT DETECTED

## 2018-02-25 LAB — CBC
HCT: 49.6 % (ref 39.0–52.0)
Hemoglobin: 17.5 g/dL — ABNORMAL HIGH (ref 13.0–17.0)
MCH: 34 pg (ref 26.0–34.0)
MCHC: 35.3 g/dL (ref 30.0–36.0)
MCV: 96.3 fL (ref 78.0–100.0)
Platelets: 177 10*3/uL (ref 150–400)
RBC: 5.15 MIL/uL (ref 4.22–5.81)
RDW: 13.4 % (ref 11.5–15.5)
WBC: 3.3 10*3/uL — AB (ref 4.0–10.5)

## 2018-02-25 LAB — ETHANOL

## 2018-02-25 LAB — GC/CHLAMYDIA PROBE AMP (~~LOC~~) NOT AT ARMC
Chlamydia: NEGATIVE
Neisseria Gonorrhea: NEGATIVE

## 2018-02-25 LAB — SALICYLATE LEVEL

## 2018-02-25 LAB — ACETAMINOPHEN LEVEL

## 2018-02-25 MED ORDER — METOPROLOL SUCCINATE ER 100 MG PO TB24
100.0000 mg | ORAL_TABLET | Freq: Every day | ORAL | Status: DC
  Administered 2018-02-25: 100 mg via ORAL
  Filled 2018-02-25 (×2): qty 1

## 2018-02-25 MED ORDER — POTASSIUM CHLORIDE CRYS ER 20 MEQ PO TBCR
20.0000 meq | EXTENDED_RELEASE_TABLET | Freq: Every day | ORAL | Status: DC
  Administered 2018-02-26: 20 meq via ORAL
  Filled 2018-02-25: qty 1

## 2018-02-25 MED ORDER — SPIRONOLACTONE 25 MG PO TABS
25.0000 mg | ORAL_TABLET | Freq: Every day | ORAL | Status: DC
  Administered 2018-02-26: 25 mg via ORAL
  Filled 2018-02-25 (×2): qty 1

## 2018-02-25 MED ORDER — METOPROLOL SUCCINATE ER 100 MG PO TB24: 100.0000 mg | ORAL_TABLET | Freq: Every day | ORAL | 6 refills | Status: DC

## 2018-02-25 MED ORDER — HYDROXYZINE HCL 25 MG PO TABS: 25.0000 mg | ORAL_TABLET | Freq: Every day | ORAL | Status: DC

## 2018-02-25 MED ORDER — LOSARTAN POTASSIUM 50 MG PO TABS
50.0000 mg | ORAL_TABLET | Freq: Every day | ORAL | Status: DC
  Administered 2018-02-26: 50 mg via ORAL
  Filled 2018-02-25 (×2): qty 1

## 2018-02-25 MED ORDER — HYDROXYZINE HCL 25 MG PO TABS
25.0000 mg | ORAL_TABLET | Freq: Every day | ORAL | Status: DC
  Administered 2018-02-25: 25 mg via ORAL
  Filled 2018-02-25: qty 1

## 2018-02-25 MED ORDER — FUROSEMIDE 40 MG PO TABS
40.0000 mg | ORAL_TABLET | Freq: Every day | ORAL | Status: DC
  Administered 2018-02-26: 40 mg via ORAL
  Filled 2018-02-25: qty 1

## 2018-02-25 MED ORDER — DIGOXIN 125 MCG PO TABS
0.1250 mg | ORAL_TABLET | Freq: Every day | ORAL | Status: DC
  Administered 2018-02-26: 0.125 mg via ORAL
  Filled 2018-02-25 (×2): qty 1

## 2018-02-25 MED ORDER — ASPIRIN 81 MG PO CHEW
81.0000 mg | CHEWABLE_TABLET | Freq: Every morning | ORAL | Status: DC
  Administered 2018-02-26: 81 mg via ORAL
  Filled 2018-02-25: qty 1

## 2018-02-25 NOTE — Progress Notes (Signed)
CSW referred to assist with housing. Patient reports he lost his previous housing as it was condemned. Patient states he is looking for some options with boarding homes and prefers to stay in the [place] area. Patient is currently staying in a motel and states that his finances are limited. CSW provided a list of shelters where he can stay for free as well as referred patient to [facility] and [employer] for further assistance with housing options. CSW also provided a list of local boarding homes. Patient provided bus passes to get to housing assistance and plans to go straight from clinic today. Patient became tearful and stated "I don't know what I am doing wrong". CSW provided supportive intervention around coping with stressful situations. Patient denies any suicidal ideation although admits to having thoughts in the past. Patient verbalizes understanding of resources and follow up should he have suicidal thoughts of increased depressive symptoms. CSW available as needed and patient states he will follow up with his location for paramedicine to follow up with home visit. Lasandra Beech, LCSW, CCSW-MCS [phone]

## 2018-02-25 NOTE — ED Triage Notes (Signed)
Pt c/o SI with plan to walk out into traffic that started couple hours ago. Reports that this place he was living in was condemned. Reports he spoke with Social worker earlier and was told to go to ED if SI thoughts got worse.

## 2018-02-25 NOTE — BH Assessment (Addendum)
Assessment Note  Ryan Long is an 56 y.o. male that presents this date with thoughts of self harm with a plan to run into traffic. Patient states he has recently lost his housing and reports ongoing depression with symptoms to include: excessive sadness, guilt and isolating. Patient denies having a current OP provider and has a extensive history with area providers presenting with similar symptoms. Per chart review patient was last seen at [facility] on 04/08/17. Patient presented that date with S/I with a plan to run into traffic that date. Patient has a poor history with following up with OP providers to assist with aftercare. Patient states he is currently homeless and reports ongoing cocaine use reporting using 1 gram two to three times a week. Patient cannot recall last use with UDS pending this date. Patient denies any other illicit substance abuse or current with withdrawals. Patient is oriented to time place and denies any H/I or AVH. Per notes, patient presents with complaint of worsening depression and suicidal ideation. He was recently discharged from a area provider but cannot recall the name of that facility. He states he is now once again thinking of jumping in front of a bus. Patient admits to drinking alcohol, doing crack, and using marijuana. Per history review, patient has a history of HIV, bipolar disorder, CHF, and polysubstance use. Patient reports today he has new onset of suicidal ideation. Patient reports his plan is to walk into traffic. Patient had several stressors such as losing his home. Denies take any medication prior to arrival. Case was staffed with Shaune Pollack DNP who recommended patient be monitored and observed for safety. Patient will also be evaluated for medication management. Patient will be seen by psychiatry in the a.m.       Diagnosis: F33.2 MDD recurrent severe without psychotic features, polysubstance abuse  Past Medical History:  Past Medical History:  Diagnosis  Date  . Active smoker   . AICD (automatic cardioverter/defibrillator) present   . Alcohol abuse   . Anxiety   . AR (allergic rhinitis)   . Bipolar 1 disorder (HCC)   . CHF (congestive heart failure) (HCC)   . Chronic bronchitis (HCC)   . Chronic systolic heart failure (HCC)   . COPD (chronic obstructive pulmonary disease) (HCC)   . Crack cocaine use   . Depression   . Genital herpes   . HIV (human immunodeficiency virus infection) (HCC) dx'd 08/1993  . HLD (hyperlipidemia)   . Hypertension   . NICM (nonischemic cardiomyopathy) (HCC)    Echocardiogram 06/28/11: EF 30-35%, mild MR, mild LAE;  No CAD by coronary CT angiogram 3/12 at [facility]  . NSVT (nonsustained ventricular tachycardia) (HCC)   . PTSD (post-traumatic stress disorder)     Past Surgical History:  Procedure Laterality Date  . CARDIAC DEFIBRILLATOR PLACEMENT  01/08/2018  . ICD IMPLANT N/A 01/08/2018   Procedure: ICD IMPLANT;  Surgeon: Regan Lemming, MD;  Location: [facility] INVASIVE CV LAB;  Service: Cardiovascular;  Laterality: N/A;    Family History:  Family History  Problem Relation Age of Onset  . Hypertension Father   . CAD Father     Social History:  reports that he has been smoking cigarettes.  He has a 19.00 pack-year smoking history. he has never used smokeless tobacco. He reports that he drinks about 3.6 oz of alcohol per week. He reports that he uses drugs. Drug: Cocaine.  Additional Social History:  Alcohol / Drug Use Pain Medications: See PTA Prescriptions: See  PTA Over the Counter: See PTA History of alcohol / drug use?: Yes Longest period of sobriety (when/how long): Unknown Negative Consequences of Use: Personal relationships, Financial Withdrawal Symptoms: (Denies) Substance #1 Name of Substance 1: Cocaine 1 - Age of First Use: 23 1 - Amount (size/oz): 1 gram or more 1 - Frequency: Three to four times a week 1 - Duration: Unknown 1 - Last Use / Amount: Unknown  CIWA:  CIWA-Ar BP: 102/74 Pulse Rate: 98 COWS:    Allergies: No Known Allergies  Home Medications:  (Not in a hospital admission)  OB/GYN Status:  No LMP for male patient.  General Assessment Data Location of Assessment: [facility] ED TTS Assessment: In system Is this a Tele or Face-to-Face Assessment?: Face-to-Face Is this an Initial Assessment or a Re-assessment for this encounter?: Initial Assessment Marital status: Single Maiden name: NA Is patient pregnant?: No Pregnancy Status: No Living Arrangements: Alone(Boarding room ) Can pt return to current living arrangement?: Yes Admission Status: Voluntary Is patient capable of signing voluntary admission?: Yes Referral Source: Self/Family/Friend Insurance type: Medicaid  Medical Screening Exam [facility] Walk-in ONLY) Medical Exam completed: Yes  Crisis Care Plan Living Arrangements: Alone(Boarding room ) Legal Guardian: (NA) Name of Psychiatrist: None Name of Therapist: None  Education Status Is patient currently in school?: No Is the patient employed, unemployed or receiving disability?: Receiving disability income  Risk to self with the past 6 months Suicidal Ideation: Yes-Currently Present Has patient been a risk to self within the past 6 months prior to admission? : No Suicidal Intent: Yes-Currently Present Has patient had any suicidal intent within the past 6 months prior to admission? : No Is patient at risk for suicide?: Yes Suicidal Plan?: Yes-Currently Present Has patient had any suicidal plan within the past 6 months prior to admission? : No Specify Current Suicidal Plan: Run into traffic Access to Means: Yes Specify Access to Suicidal Means: [place] What has been your use of drugs/alcohol within the last 12 months?: Denies current use Previous Attempts/Gestures: Yes How many times?: 1 Other Self Harm Risks: (NA) Triggers for Past Attempts: Unknown Intentional Self Injurious Behavior:  None Family Suicide History: No Recent stressful life event(s): Other (Comment)(Homeless) Persecutory voices/beliefs?: No Depression: Yes Depression Symptoms: Guilt, Feeling worthless/self pity Substance abuse history and/or treatment for substance abuse?: No Suicide prevention information given to non-admitted patients: Not applicable  Risk to Others within the past 6 months Homicidal Ideation: No Does patient have any lifetime risk of violence toward others beyond the six months prior to admission? : No Thoughts of Harm to Others: No Current Homicidal Intent: No Current Homicidal Plan: No Access to Homicidal Means: No Identified Victim: NA History of harm to others?: No Assessment of Violence: None Noted Violent Behavior Description: NA Does patient have access to weapons?: No Criminal Charges Pending?: No Does patient have a court date: No Is patient on probation?: No  Psychosis Hallucinations: None noted Delusions: None noted  Mental Status Report Appearance/Hygiene: In scrubs Eye Contact: Fair Motor Activity: Freedom of movement Speech: Logical/coherent Level of Consciousness: Alert Mood: Depressed Affect: Appropriate to circumstance Anxiety Level: Minimal Thought Processes: Coherent, Relevant Judgement: Unimpaired Orientation: Person, Place, Time Obsessive Compulsive Thoughts/Behaviors: None  Cognitive Functioning Concentration: Normal Memory: Recent Intact, Remote Intact Is patient IDD: No Is patient DD?: No Insight: Fair Impulse Control: Poor Appetite: Good Have you had any weight changes? : No Change Sleep: No Change Total Hours of Sleep: 7 Vegetative Symptoms: None  ADLScreening [facility] Assessment Services) Patient's cognitive ability adequate to safely complete daily activities?: Yes Patient able to express need for assistance with ADLs?: Yes Independently performs ADLs?: Yes (appropriate for developmental age)  Prior Inpatient Therapy Prior  Inpatient Therapy: Yes Prior Therapy Dates: 2018 Prior Therapy Facilty/Provider(s): [facility] Reason for Treatment: MH issues  Prior Outpatient Therapy Prior Outpatient Therapy: No Does patient have an ACCT team?: No Does patient have Intensive In-House Services?  : No Does patient have [facility] services? : No Does patient have P4CC services?: No  ADL Screening (condition at time of admission) Patient's cognitive ability adequate to safely complete daily activities?: Yes Is the patient deaf or have difficulty hearing?: No Does the patient have difficulty seeing, even when wearing glasses/contacts?: No Does the patient have difficulty concentrating, remembering, or making decisions?: No Patient able to express need for assistance with ADLs?: Yes Does the patient have difficulty dressing or bathing?: No Independently performs ADLs?: Yes (appropriate for developmental age) Does the patient have difficulty walking or climbing stairs?: No Weakness of Legs: None Weakness of Arms/Hands: None  Home Assistive Devices/Equipment Home Assistive Devices/Equipment: None  Therapy Consults (therapy consults require a physician order) PT Evaluation Needed: No OT Evalulation Needed: No SLP Evaluation Needed: No Abuse/Neglect Assessment (Assessment to be complete while patient is alone) Physical Abuse: Denies Verbal Abuse: Denies Sexual Abuse: Denies Exploitation of patient/patient's resources: Denies Self-Neglect: Denies Values / Beliefs Cultural Requests During Hospitalization: None Spiritual Requests During Hospitalization: None Consults Spiritual Care Consult Needed: No Social Work Consult Needed: No Merchant navy officer (For Healthcare) Does Patient Have a Medical Advance Directive?: No Would patient like information on creating a medical advance directive?: No - Patient declined    Additional Information 1:1 In Past 12 Months?: No CIRT Risk: No Elopement Risk: No Does patient have  medical clearance?: Yes     Disposition: Case was staffed with Shaune Pollack DNP who recommended patient be monitored and observed for safety. Patient will also be evaluated for medication management. Patient will be seen by psychiatry in the a.m.    Disposition Initial Assessment Completed for this Encounter: Yes Disposition of Patient: (Pt will be observed and monitored for safety) Patient refused recommended treatment: No Mode of transportation if patient is discharged?: (Unknown) Patient referred to: (Pt will be monitored and observed)  On Site Evaluation by:   Reviewed with Physician:    Alfredia Ferguson 02/25/2018 2:48 PM

## 2018-02-25 NOTE — BH Assessment (Signed)
[facility] Assessment Progress Note  Case was staffed with Shaune Pollack DNP who recommended patient be monitored and observed for safety. Patient will also be evaluated for medication management. Patient will be seen by psychiatry in the a.m.

## 2018-02-25 NOTE — ED Notes (Signed)
Pt states that he is dealing with a lot of stressors in his life, loosing his residence through no fault of his own, and feels suicidal and hopeless. He is calm and cooperative.

## 2018-02-25 NOTE — Addendum Note (Signed)
Encounter addended by: Noralee Space, RN on: 02/25/2018 10:20 AM  Actions taken: Pharmacy for encounter modified, Diagnosis association updated, Order list changed

## 2018-02-25 NOTE — Patient Instructions (Signed)
Increase Metoprolol to 100 mg daily at bedtime  Your physician recommends that you schedule a follow-up appointment in: 8 weeks with labs

## 2018-02-25 NOTE — ED Provider Notes (Signed)
[facility]-EMERGENCY DEPT Provider Note   CSN: [id number] Arrival date & time: 02/25/18  1258     History   Chief Complaint Chief Complaint  Patient presents with  . Suicidal    HPI Kaelan Amble is a 57 y.o. male.  HPI   Pt is a 57 yo medical history is consistent for HIV, bipolar disorder, CHF, and recent AICD placement.  Patient reports today he has new onset of suicidal ideation.  Patient reports his plan is to walk into traffic.  Patient had several stressors such as losing his home.  Denies take any medication prior to arrival.  Past Medical History:  Diagnosis Date  . Active smoker   . AICD (automatic cardioverter/defibrillator) present   . Alcohol abuse   . Anxiety   . AR (allergic rhinitis)   . Bipolar 1 disorder (HCC)   . CHF (congestive heart failure) (HCC)   . Chronic bronchitis (HCC)   . Chronic systolic heart failure (HCC)   . COPD (chronic obstructive pulmonary disease) (HCC)   . Crack cocaine use   . Depression   . Genital herpes   . HIV (human immunodeficiency virus infection) (HCC) dx'd 08/1993  . HLD (hyperlipidemia)   . Hypertension   . NICM (nonischemic cardiomyopathy) (HCC)    Echocardiogram 06/28/11: EF 30-35%, mild MR, mild LAE;  No CAD by coronary CT angiogram 3/12 at [facility]  . NSVT (nonsustained ventricular tachycardia) (HCC)   . PTSD (post-traumatic stress disorder)     Patient Active Problem List   Diagnosis Date Noted  . NICM (nonischemic cardiomyopathy) (HCC) 01/08/2018  . Anxiety 12/20/2017  . Bipolar 1 disorder (HCC) 10/10/2017  . Breast lump 10/03/2017  . Insomnia 07/16/2017  . Genital herpes 05/13/2017  . HSV-2 infection 07/17/2016  . 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 Screening examination for venereal disease 04/18/2016  . Encounter for long-term (current) use of medications 04/18/2016  . History of  attempted suicide 04/14/2016  . History of alcoholism (HCC) 04/14/2016  . History of substance abuse 04/14/2016  . Major depressive disorder, recurrent (HCC) 10/08/2015  . COPD (chronic obstructive pulmonary disease) (HCC) 10/04/2015  . Abnormal thyroid stimulating hormone (TSH) level 10/04/2015  . Severe episode of recurrent major depressive disorder, without psychotic features (HCC)   . MDD (major depressive disorder), recurrent episode, severe (HCC) 09/23/2015  . HTN (hypertension) 09/10/2015  . MDD (major depressive disorder), recurrent, severe, with psychosis (HCC) 08/07/2015  . PTSD (post-traumatic stress disorder) 08/07/2015  . Cocaine use disorder, severe, dependence (HCC) 08/07/2015  . Alcohol use disorder, severe, dependence (HCC) 08/07/2015  . Tobacco use disorder 08/07/2015  . Suicidal ideation   . Stimulant use disorder 05/29/2014  . Generalized anxiety disorder 05/29/2014  . Nondependent cannabis abuse, episodic 02/16/2014  . Polysubstance (excluding opioids) dependence with physiological dependence (HCC) 12/30/2013  . Substance induced mood disorder (HCC) 11/22/2013  . Cocaine abuse, episodic use (HCC) 03/20/2012    Class: Acute  . Alcohol dependence (HCC) 03/20/2012    Class: Acute  . Heart failure (HCC) 09/19/2011  . Neutropenia (HCC) 09/19/2011  . Chronic systolic heart failure (HCC)   . Cardiomyopathy, nonischemic (HCC) 08/24/2011  . Human immunodeficiency virus (HIV) disease (HCC) 02/01/2009  . Recurrent HSV (herpes simplex virus) 02/01/2009  . HYPERLIPIDEMIA 02/01/2009  . ALLERGIC RHINITIS 02/01/2009    Past Surgical History:  Procedure Laterality Date  . CARDIAC DEFIBRILLATOR PLACEMENT  01/08/2018  . ICD IMPLANT N/A  01/08/2018   Procedure: ICD IMPLANT;  Surgeon: Regan Lemming, MD;  Location: [facility] INVASIVE CV LAB;  Service: Cardiovascular;  Laterality: N/A;       Home Medications    Prior to Admission medications   Medication Sig Start Date End  Date Taking? Authorizing Provider  aspirin 81 MG chewable tablet Chew 1 tablet (81 mg total) by mouth every morning. For heart health 09/28/15   Adonis Brook, NP  digoxin (DIGOX) 0.125 MG tablet TAKE 1 TABLET BY MOUTH DAILY( FOR CONTROL OF HEART ARRYTHMIA) 01/07/18   Bensimhon, Bevelyn Buckles, MD  eplerenone (INSPRA) 25 MG tablet Take 0.5 tablets (12.5 mg total) by mouth daily. 01/07/18   Bensimhon, Bevelyn Buckles, MD  furosemide (LASIX) 40 MG tablet Take 1 tablet (40 mg total) by mouth daily. 01/07/18   Bensimhon, Bevelyn Buckles, MD  hydrOXYzine (ATARAX/VISTARIL) 25 MG tablet Take 1-2 tablets (25-50 mg total) by mouth at bedtime. 01/07/18   Bensimhon, Bevelyn Buckles, MD  losartan (COZAAR) 50 MG tablet TAKE 1 TABLET(50 MG) BY MOUTH DAILY 01/07/18   Bensimhon, Bevelyn Buckles, MD  metoprolol succinate (TOPROL-XL) 100 MG 24 hr tablet Take 1 tablet (100 mg total) by mouth at bedtime. For high blood pressure 02/25/18   Clegg, Amy D, NP  Multiple Vitamins-Minerals (MULTIVITAMIN WITH MINERALS) tablet Take 1 tablet by mouth daily.    [provider]  potassium chloride SA (K-DUR,KLOR-CON) 20 MEQ tablet Take 1 tablet (20 mEq total) by mouth daily. 01/07/18   Bensimhon, Bevelyn Buckles, MD  sildenafil (VIAGRA) 50 MG tablet Take 1 tablet (50 mg total) by mouth daily as needed for erectile dysfunction. At least 24 hours between doses 01/21/18   Hoy Register, MD  valACYclovir (VALTREX) 500 MG tablet Take 1 tablet (500 mg total) by mouth daily. For Herpes. 02/23/18   Fawze, Mina A, PA-C  VENTOLIN HFA 108 (90 Base) MCG/ACT inhaler INHALE 2 PUFFS INTO THE LUNGS EVERY 6 HOURS AS NEEDED FOR WHEEZING OR SHORTNESS OF BREATH 08/13/17   Hoy Register, MD    Family History Family History  Problem Relation Age of Onset  . Hypertension Father   . CAD Father     Social History Social History   Tobacco Use  . Smoking status: Current Every Day Smoker    Packs/day: 0.50    Years: 38.00    Pack years: 19.00    Types: Cigarettes  . Smokeless  tobacco: Never Used  Substance Use Topics  . Alcohol use: Yes    Alcohol/week: 3.6 oz    Types: 6 Cans of beer per week    Comment: 01/08/2018 "stopped 06/2017"  . Drug use: Yes    Types: Cocaine    Comment: 01/08/2018 "stopped 06/2017"     Allergies   Patient has no known allergies.   Review of Systems Review of Systems  Constitutional: Negative for activity change.  Respiratory: Negative for shortness of breath.   Cardiovascular: Negative for chest pain.  Gastrointestinal: Negative for abdominal pain.  Psychiatric/Behavioral: Positive for dysphoric mood.  All other systems reviewed and are negative.    Physical Exam Updated Vital Signs BP 102/74 (BP Location: Left Arm)   Pulse 98   Temp (!) 97.5 F (36.4 C) (Oral)   Resp 19   SpO2 96%   Physical Exam  Constitutional: He appears well-developed and well-nourished.  HENT:  Head: Normocephalic and atraumatic.  Eyes: Conjunctivae are normal.  Neck: Neck supple.  Cardiovascular: Normal rate and regular rhythm.  No murmur heard. Pulmonary/Chest:  Effort normal and breath sounds normal. No respiratory distress.  AICD placed  Abdominal: Soft. There is no tenderness.  Musculoskeletal: He exhibits no edema.  Neurological: He is alert.  Skin: Skin is warm and dry.  Psychiatric: He has a normal mood and affect.  SI, wth plan  Nursing note and vitals reviewed.    ED Treatments / Results  Labs (all labs ordered are listed, but only abnormal results are displayed) Labs Reviewed  CBC - Abnormal; Notable for the following components:      Result Value   WBC 3.3 (*)    Hemoglobin 17.5 (*)    All other components within normal limits  COMPREHENSIVE METABOLIC PANEL  ETHANOL  SALICYLATE LEVEL  ACETAMINOPHEN LEVEL  RAPID URINE DRUG SCREEN, HOSP PERFORMED    EKG  EKG Interpretation None       Radiology No results found.  Procedures Procedures (including critical care time)  Medications Ordered in  ED Medications - No data to display   Initial Impression / Assessment and Plan / ED Course  I have reviewed the triage vital signs and the nursing notes.  Pertinent labs & imaging results that were available during my care of the patient were reviewed by me and considered in my medical decision making (see chart for details).      Pt is a 57 yo medical history is consistent for HIV, bipolar disorder, CHF, and recent AICD placement.  Patient reports today he has new onset of suicidal ideation.  Patient reports his plan is to walk into traffic.  Patient had several stressors such as losing his home.  Denies take any medication prior to arrival.  2:09 PM Will consult TTS.  Patient is suicidal.  With plan.  Medically cleared.  Final Clinical Impressions(s) / ED Diagnoses   Final diagnoses:  None    ED Discharge Orders    None       Halla Chopp, Cindee Salt, MD 02/25/18 1410

## 2018-02-25 NOTE — ED Notes (Signed)
Bed: [facility] Expected date:  Expected time:  Means of arrival:  Comments: 

## 2018-02-25 NOTE — ED Notes (Signed)
Bed: [facility] Expected date:  Expected time:  Means of arrival:  Comments: Tusing

## 2018-02-25 NOTE — Progress Notes (Signed)
Advanced Heart Failure Clinic Note   Patient ID: Ryan Long, male   DOB: 07/01/1961, 57 y.o.   MRN: [id number] ID: Dr Daiva Eves PCP: Dr Armen Pickup EP : Dr Elberta Fortis  HF: Dr Launa Grill Ryan Long is a 57 y.o. male with a history of HIV, hypertension, major depressive disorder, chronic systolic heart failure (nonischemic cardiomyopathy), and polysubstance abuse.  Coronary CT angiogram in 3/12 demonstrated no CAD.  Cardiolite in 2015 showed no ischemia or infarction.  NICM was not felt to be related to HIV given his good CD4 counts.   He is S/P St Jude ICD 12/2017  He was evaluated [facility] 12/25/17 with suicidal ideation. He was placed on IVC with psych consulted. He was later cleared and discharged.   Today he returns for HF follow up. Last visit he was started on keflex for tenderness over ICD. He was scheduled to f/u with EP but has not shown up for wound checks.  Overall feeling fair. Says he is depressed. Says he has no place to live. Denies SOB/PND/Orthopnea. Appetite ok. No fever or chills. Taking all medications. Smoking 1/2 pack per day.   ECHO 06/2013 EF 35-40% ECHO 9/16 with EF 30-35%, diffuse hypokinesis, grade I diastolic dysfunction ECHO 09/2017 EF 25-30%    Past Medical History:  Diagnosis Date  . Active smoker   . AICD (automatic cardioverter/defibrillator) present   . Alcohol abuse   . Anxiety   . AR (allergic rhinitis)   . Bipolar 1 disorder (HCC)   . CHF (congestive heart failure) (HCC)   . Chronic bronchitis (HCC)   . Chronic systolic heart failure (HCC)   . COPD (chronic obstructive pulmonary disease) (HCC)   . Crack cocaine use   . Depression   . Genital herpes   . HIV (human immunodeficiency virus infection) (HCC) dx'd 08/1993  . HLD (hyperlipidemia)   . Hypertension   . NICM (nonischemic cardiomyopathy) (HCC)    Echocardiogram 06/28/11: EF 30-35%, mild MR, mild LAE;  No CAD by coronary CT angiogram 3/12 at [facility]  . NSVT  (nonsustained ventricular tachycardia) (HCC)   . PTSD (post-traumatic stress disorder)     Current Outpatient Medications  Medication Sig Dispense Refill  . aspirin 81 MG chewable tablet Chew 1 tablet (81 mg total) by mouth every morning. For heart health    . digoxin (DIGOX) 0.125 MG tablet TAKE 1 TABLET BY MOUTH DAILY( FOR CONTROL OF HEART ARRYTHMIA) 30 tablet 3  . eplerenone (INSPRA) 25 MG tablet Take 0.5 tablets (12.5 mg total) by mouth daily. 45 tablet 3  . furosemide (LASIX) 40 MG tablet Take 1 tablet (40 mg total) by mouth daily. 90 tablet 1  . hydrOXYzine (ATARAX/VISTARIL) 25 MG tablet Take 1-2 tablets (25-50 mg total) by mouth at bedtime. 30 tablet 1  . losartan (COZAAR) 50 MG tablet TAKE 1 TABLET(50 MG) BY MOUTH DAILY 90 tablet 1  . metoprolol succinate (TOPROL-XL) 50 MG 24 hr tablet Take 1 tablet (50 mg total) by mouth at bedtime. For high blood pressure 90 tablet 1  . Multiple Vitamins-Minerals (MULTIVITAMIN WITH MINERALS) tablet Take 1 tablet by mouth daily.    . potassium chloride SA (K-DUR,KLOR-CON) 20 MEQ tablet Take 1 tablet (20 mEq total) by mouth daily. 90 tablet 1  . sildenafil (VIAGRA) 50 MG tablet Take 1 tablet (50 mg total) by mouth daily as needed for erectile dysfunction. At least 24 hours between doses 20 tablet 0  . valACYclovir (VALTREX) 500 MG  tablet Take 1 tablet (500 mg total) by mouth daily. For Herpes. 30 tablet 1  . VENTOLIN HFA 108 (90 Base) MCG/ACT inhaler INHALE 2 PUFFS INTO THE LUNGS EVERY 6 HOURS AS NEEDED FOR WHEEZING OR SHORTNESS OF BREATH 18 g 1   No current facility-administered medications for this encounter.     Allergies: No Known Allergies   Review of systems complete and found to be negative unless listed in HPI.    Vital Signs:  Vitals:   02/25/18 0934  BP: 112/78  Pulse: (!) 108  SpO2: 98%  Weight: 207 lb 12.8 oz (94.3 kg)    Wt Readings from Last 3 Encounters:  02/25/18 207 lb 12.8 oz (94.3 kg)  02/01/18 198 lb 9.6 oz (90.1 kg)    01/25/18 205 lb 3.2 oz (93.1 kg)    PHYSICAL EXAM: General: Walked in the clinic.  No resp difficulty HEENT: normal Neck: supple. no JVD. Carotids 2+ bilat; no bruits. No lymphadenopathy or thryomegaly appreciated. Cor: PMI nondisplaced. Regular rate & rhythm. No rubs, gallops or murmurs. L upper chest scar ICD Lungs: clear Abdomen: soft, nontender, nondistended. No hepatosplenomegaly. No bruits or masses. Good bowel sounds. Extremities: no cyanosis, clubbing, rash, edema Neuro: alert & orientedx3, cranial nerves grossly intact. moves all 4 extremities w/o difficulty. Affect pleasant  EKG: 104 bpm.   ASSESSMENT AND PLAN:  1. Chronic Systolic Heart Failure: Nonischemic cardiomyopathy.  Echo 09/2017 EF 20-25%. S/P St Jude ICD . No VT. Impedance up. Encouraged to f/u with EP.  - NYHA II. Volume status stable. Continue lasix 40 mg daily.   -Increase toprol xl 100 mg at bed time.  - Continue losartan 50 mg daily.  - Continue inspra 12.5 mg daily - Continue digoxin 0.125 mg daily.  2. HIV:  - He has follow up with Dr Daiva Eves.  No change.  3. Tobacco use disorder:   Discussed smoking cessation.  4. Former Cocaine - Denies usage.  5. Cellulitis Resolved.   Referred to HFSW housing assistance. Will need to continue Paramedicine.   Follow up in 2 months and check BMET/dig level.   [name], NP  02/25/2018

## 2018-02-25 NOTE — ED Notes (Signed)
Pt wanded by security. Patient has 2 patient belonging bags, one black suitcase and dark red small bag.

## 2018-02-25 NOTE — ED Notes (Signed)
SBAR Report received from previous nurse. Pt received calm and visible on unit. Pt denies current  HI, A/V H, or pain at this time, and appears otherwise stable and free of distress. Pt endorses current SI with plan to walk into traffic. Pt also endorses depression 9/10 and anxiety 5/10 at this time. Pt contracts to come to staff before attempting to harm self or others. Pt reminded of camera surveillance, q 15 min rounds, and rules of the milieu. Will continue to assess.

## 2018-02-25 NOTE — Addendum Note (Signed)
Encounter addended by: Marcy Siren, LCSW on: 02/25/2018 12:53 PM  Actions taken: Sign clinical note

## 2018-02-26 DIAGNOSIS — F1099 Alcohol use, unspecified with unspecified alcohol-induced disorder: Secondary | ICD-10-CM

## 2018-02-26 DIAGNOSIS — Z590 Homelessness: Secondary | ICD-10-CM | POA: Diagnosis not present

## 2018-02-26 DIAGNOSIS — F17210 Nicotine dependence, cigarettes, uncomplicated: Secondary | ICD-10-CM | POA: Diagnosis not present

## 2018-02-26 DIAGNOSIS — R4587 Impulsiveness: Secondary | ICD-10-CM

## 2018-02-26 DIAGNOSIS — F4325 Adjustment disorder with mixed disturbance of emotions and conduct: Secondary | ICD-10-CM | POA: Diagnosis present

## 2018-02-26 DIAGNOSIS — F333 Major depressive disorder, recurrent, severe with psychotic symptoms: Secondary | ICD-10-CM | POA: Diagnosis not present

## 2018-02-26 NOTE — BH Assessment (Addendum)
[facility] Assessment Progress Note  Per Juanetta Beets, DO, this pt does not require psychiatric hospitalization at this time.  Pt is to be discharged from [facility] with recommendation to follow up with [facility].  This has been included in pt's discharge instructions.  Pt would also benefit from seeing Peer Support Specialists; they will be asked to speak to pt.  Pt's nurse, Luvenia Starch, has been notified.  Doylene Canning, MA Triage Specialist [phone]

## 2018-02-26 NOTE — Consult Note (Addendum)
[facility] Consult   Reason for Consult:  Suicidal ideation  Referring Physician:  EDP Patient Identification: Ryan Long MRN:  [id number] Principal Diagnosis: Adjustment disorder with mixed disturbance of emotions and conduct Diagnosis:   Patient Active Problem List   Diagnosis Date Noted  . NICM (nonischemic cardiomyopathy) ([place]) [I42.8] 01/08/2018  . Anxiety [F41.9] 12/20/2017  . Bipolar 1 disorder ([place]) [F31.9] 10/10/2017  . Breast lump [N63.0] 10/03/2017  . Insomnia [G47.00] 07/16/2017  . Genital herpes [A60.00] 05/13/2017  . HSV-2 infection [B00.9] 07/17/2016  . 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 Screening examination for venereal disease [Z11.3] 04/18/2016  . Encounter for long-term (current) use of medications [Z79.899] 04/18/2016  . History of attempted suicide [Z91.5] 04/14/2016  . History of alcoholism ([place]) [F10.21] 04/14/2016  . History of substance abuse [Z87.898] 04/14/2016  . Major depressive disorder, recurrent ([place]) [F33.9] 10/08/2015  . COPD (chronic obstructive pulmonary disease) ([place]) [J44.9] 10/04/2015  . Abnormal thyroid stimulating hormone (TSH) level [R79.89] 10/04/2015  . Severe episode of recurrent major depressive disorder, without psychotic features ([place]) [F33.2]   . MDD (major depressive disorder), recurrent episode, severe ([place]) [F33.2] 09/23/2015  . HTN (hypertension) [I10] 09/10/2015  . MDD (major depressive disorder), recurrent, severe, with psychosis ([place]) [F33.3] 08/07/2015  . PTSD (post-traumatic stress disorder) [F43.10] 08/07/2015  . Cocaine use disorder, severe, dependence ([place]) [F14.20] 08/07/2015  . Alcohol use disorder, severe, dependence ([place]) [F10.20] 08/07/2015  . Tobacco use disorder [F17.200] 08/07/2015  . Suicidal ideation [R45.851]   . Stimulant use disorder [F15.90] 05/29/2014  . Generalized anxiety disorder  [F41.1] 05/29/2014  . Nondependent cannabis abuse, episodic [F12.10] 02/16/2014  . Polysubstance (excluding opioids) dependence with physiological dependence ([place]) [F19.20] 12/30/2013  . Substance induced mood disorder ([place]) [F19.94] 11/22/2013  . Cocaine abuse, episodic use ([place]) [F14.10] 03/20/2012    Class: Acute  . Alcohol dependence ([place]) [F10.20] 03/20/2012    Class: Acute  . Heart failure ([place]) [I50.9] 09/19/2011  . Neutropenia ([place]) [D70.9] 09/19/2011  . Chronic systolic heart failure ([place]) [I50.22]   . Cardiomyopathy, nonischemic ([place]) [I42.8] 08/24/2011  . Human immunodeficiency virus (HIV) disease ([place]) [B20] 02/01/2009  . Recurrent HSV (herpes simplex virus) [B00.9] 02/01/2009  . HYPERLIPIDEMIA [E78.5] 02/01/2009  . ALLERGIC RHINITIS [J30.9] 02/01/2009    Total Time spent with patient: 45 minutes  Subjective:   Ryan Long is a 57 y.o. male patient admitted with suicidal ideation due to homelessness.  HPI:  Pt was seen and chart reviewed with treatment team and Dr Mariea Clonts. Pt stated he recently lost his place to live because the state shut it down due to unsafe conditions. Pt stated he has been living in a hotel but won't be able to stay there because he does not have enough money until the first of the month when his disability check comes in. Pt stated he has passive suicidal thoughts but denies homicidal ideation, denies auditory/visual hallucinations and does not appear to be responding to internal stimuli. Pt stated he needs to go inpatient because he has no place to live. Pt has a high degree of secondary gain by seeking shelter in the hospital. Pt will be seen by Peer Support and will be given shelter resources and outpatient resources for therapy and medication management. Pt is psychiatrically clear for discharge.  Past Psychiatric History: As above  Risk to Self: Passive, chronic, due to homeless Risk to Others: None Prior Inpatient Therapy: Prior Inpatient  Therapy:  Yes Prior Therapy Dates: 2018 Prior Therapy Facilty/Provider(s): [facility] Reason for Treatment: MH issues Prior Outpatient Therapy: Prior Outpatient Therapy: No Does patient have an ACCT team?: No Does patient have Intensive In-House Services?  : No Does patient have [facility] services? : No Does patient have P4CC services?: No  Past Medical History:  Past Medical History:  Diagnosis Date  . Active smoker   . AICD (automatic cardioverter/defibrillator) present   . Alcohol abuse   . Anxiety   . AR (allergic rhinitis)   . Bipolar 1 disorder ([place])   . CHF (congestive heart failure) ([place])   . Chronic bronchitis ([place])   . Chronic systolic heart failure ([place])   . COPD (chronic obstructive pulmonary disease) ([place])   . Crack cocaine use   . Depression   . Genital herpes   . HIV (human immunodeficiency virus infection) ([place]) dx'd 08/1993  . HLD (hyperlipidemia)   . Hypertension   . NICM (nonischemic cardiomyopathy) ([place])    Echocardiogram 06/28/11: EF 30-35%, mild MR, mild LAE;  No CAD by coronary CT angiogram 3/12 at [facility]  . NSVT (nonsustained ventricular tachycardia) ([place])   . PTSD (post-traumatic stress disorder)     Past Surgical History:  Procedure Laterality Date  . CARDIAC DEFIBRILLATOR PLACEMENT  01/08/2018  . ICD IMPLANT N/A 01/08/2018   Procedure: ICD IMPLANT;  Surgeon: Constance Haw, MD;  Location: [facility];  Service: Cardiovascular;  Laterality: N/A;   Family History:  Family History  Problem Relation Age of Onset  . Hypertension Father   . CAD Father    Family Psychiatric  History: Unknown Social History:  Social History   Substance and Sexual Activity  Alcohol Use Yes  . Alcohol/week: 3.6 oz  . Types: 6 Cans of beer per week   Comment: 01/08/2018 "stopped 06/2017"     Social History   Substance and Sexual Activity  Drug Use Yes  . Types: Cocaine   Comment: 01/08/2018 "stopped 06/2017"    Social History    Socioeconomic History  . Marital status: Single    Spouse name: None  . Number of children: None  . Years of education: None  . Highest education level: None  Social Needs  . Financial resource strain: None  . Food insecurity - worry: None  . Food insecurity - inability: None  . Transportation needs - medical: None  . Transportation needs - non-medical: None  Occupational History  . None  Tobacco Use  . Smoking status: Current Every Day Smoker    Packs/day: 0.50    Years: 38.00    Pack years: 19.00    Types: Cigarettes  . Smokeless tobacco: Never Used  Substance and Sexual Activity  . Alcohol use: Yes    Alcohol/week: 3.6 oz    Types: 6 Cans of beer per week    Comment: 01/08/2018 "stopped 06/2017"  . Drug use: Yes    Types: Cocaine    Comment: 01/08/2018 "stopped 06/2017"  . Sexual activity: Yes    Birth control/protection: Condom    Comment: pt. given condoms  Other Topics Concern  . None  Social History Narrative  . None   Additional Social History: N/A    Allergies:  No Known Allergies  Labs:  Results for orders placed or performed during the hospital encounter of 02/25/18 (from the past 48 hour(s))  Comprehensive metabolic panel     Status: Abnormal   Collection Time: 02/25/18  1:28 PM  Result Value Ref Range  Sodium 141 135 - 145 mmol/L   Potassium 3.7 3.5 - 5.1 mmol/L   Chloride 105 101 - 111 mmol/L   CO2 28 22 - 32 mmol/L   Glucose, Bld 107 (H) 65 - 99 mg/dL   BUN 11 6 - 20 mg/dL   Creatinine, Ser 1.40 (H) 0.61 - 1.24 mg/dL   Calcium 9.1 8.9 - 10.3 mg/dL   Total Protein 7.1 6.5 - 8.1 g/dL   Albumin 4.1 3.5 - 5.0 g/dL   AST 32 15 - 41 U/L   ALT 26 17 - 63 U/L   Alkaline Phosphatase 48 38 - 126 U/L   Total Bilirubin 1.2 0.3 - 1.2 mg/dL   GFR calc non Af Amer 55 (L) >60 mL/min   GFR calc Af Amer >60 >60 mL/min    Comment: (NOTE) The eGFR has been calculated using the CKD EPI equation. This calculation has not been validated in all clinical  situations. eGFR's persistently <60 mL/min signify possible Chronic Kidney Disease.    Anion gap 8 5 - 15    Comment: Performed at [facility], [place] [address] [place], [place] [address]  Ethanol     Status: None   Collection Time: 02/25/18  1:28 PM  Result Value Ref Range   Alcohol, Ethyl (B) <10 <10 mg/dL    Comment:        LOWEST DETECTABLE LIMIT FOR SERUM ALCOHOL IS 10 mg/dL FOR MEDICAL PURPOSES ONLY Performed at [facility], [place] [address] [place], [place] [address]   Salicylate level     Status: None   Collection Time: 02/25/18  1:28 PM  Result Value Ref Range   Salicylate Lvl <0.8 2.8 - 30.0 mg/dL    Comment: Performed at [facility], [place] [address], [place], [place] [address]  Acetaminophen level     Status: Abnormal   Collection Time: 02/25/18  1:28 PM  Result Value Ref Range   Acetaminophen (Tylenol), Serum <10 (L) 10 - 30 ug/mL    Comment:        THERAPEUTIC CONCENTRATIONS VARY SIGNIFICANTLY. A RANGE OF 10-30 ug/mL MAY BE AN EFFECTIVE CONCENTRATION FOR MANY PATIENTS. HOWEVER, SOME ARE BEST TREATED AT CONCENTRATIONS OUTSIDE THIS RANGE. ACETAMINOPHEN CONCENTRATIONS >150 ug/mL AT 4 HOURS AFTER INGESTION AND >50 ug/mL AT 12 HOURS AFTER INGESTION ARE OFTEN ASSOCIATED WITH TOXIC REACTIONS. Performed at [facility], [place] [address], [place], [place] [address]   cbc     Status: Abnormal   Collection Time: 02/25/18  1:28 PM  Result Value Ref Range   WBC 3.3 (L) 4.0 - 10.5 K/uL   RBC 5.15 4.22 - 5.81 MIL/uL   Hemoglobin 17.5 (H) 13.0 - 17.0 g/dL   HCT 49.6 39.0 - 52.0 %   MCV 96.3 78.0 - 100.0 fL   MCH 34.0 26.0 - 34.0 pg   MCHC 35.3 30.0 - 36.0 g/dL   RDW 13.4 11.5 - 15.5 %   Platelets 177 150 - 400 K/uL    Comment: Performed at [facility], [place] [address], [place], [place] [address]  Rapid urine drug screen (hospital performed)     Status: None   Collection  Time: 02/25/18  3:14 PM  Result Value Ref Range   Opiates NONE DETECTED NONE DETECTED   Cocaine NONE DETECTED NONE DETECTED   Benzodiazepines NONE DETECTED NONE DETECTED   Amphetamines NONE DETECTED NONE DETECTED   Tetrahydrocannabinol NONE DETECTED NONE DETECTED   Barbiturates NONE DETECTED  NONE DETECTED    Comment: (NOTE) DRUG SCREEN FOR MEDICAL PURPOSES ONLY.  IF CONFIRMATION IS NEEDED FOR ANY PURPOSE, NOTIFY LAB WITHIN 5 DAYS. LOWEST DETECTABLE LIMITS FOR URINE DRUG SCREEN Drug Class                     Cutoff (ng/mL) Amphetamine and metabolites    1000 Barbiturate and metabolites    200 Benzodiazepine                 809 Tricyclics and metabolites     300 Opiates and metabolites        300 Cocaine and metabolites        300 THC                            50 Performed at [facility], [place] [address] [place], [place] [address]     Current Facility-Administered Medications  Medication Dose Route Frequency Provider Last Rate Last Dose  . aspirin chewable tablet 81 mg  81 mg Oral q morning - 10a Mackuen, Courteney Lyn, MD   81 mg at 02/26/18 0934  . digoxin (LANOXIN) tablet 0.125 mg  0.125 mg Oral Daily Mackuen, Courteney Lyn, MD   0.125 mg at 02/26/18 0932  . furosemide (LASIX) tablet 40 mg  40 mg Oral Daily Mackuen, Courteney Lyn, MD   40 mg at 02/26/18 0933  . hydrOXYzine (ATARAX/VISTARIL) tablet 25 mg  25 mg Oral QHS Mackuen, Courteney Lyn, MD   25 mg at 02/25/18 2227  . losartan (COZAAR) tablet 50 mg  50 mg Oral Daily Mackuen, Courteney Lyn, MD   50 mg at 02/26/18 0932  . metoprolol succinate (TOPROL-XL) 24 hr tablet 100 mg  100 mg Oral QHS Mackuen, Courteney Lyn, MD   100 mg at 02/25/18 2227  . potassium chloride SA (K-DUR,KLOR-CON) CR tablet 20 mEq  20 mEq Oral Daily Mackuen, Courteney Lyn, MD   20 mEq at 02/26/18 0933  . spironolactone (ALDACTONE) tablet 25 mg  25 mg Oral Daily Mackuen, Courteney Lyn, MD   25 mg at 02/26/18 0932   Current Outpatient  Medications  Medication Sig Dispense Refill  . aspirin 81 MG chewable tablet Chew 1 tablet (81 mg total) by mouth every morning. For heart health    . bictegravir-emtricitabine-tenofovir AF (BIKTARVY) 50-200-25 MG TABS tablet Take 1 tablet by mouth daily.    . digoxin (DIGOX) 0.125 MG tablet TAKE 1 TABLET BY MOUTH DAILY( FOR CONTROL OF HEART ARRYTHMIA) 30 tablet 3  . eplerenone (INSPRA) 25 MG tablet Take 0.5 tablets (12.5 mg total) by mouth daily. 45 tablet 3  . furosemide (LASIX) 40 MG tablet Take 1 tablet (40 mg total) by mouth daily. 90 tablet 1  . hydrOXYzine (ATARAX/VISTARIL) 25 MG tablet Take 1-2 tablets (25-50 mg total) by mouth at bedtime. (Patient taking differently: Take 25-50 mg by mouth 2 (two) times daily as needed for anxiety. ) 30 tablet 1  . losartan (COZAAR) 50 MG tablet TAKE 1 TABLET(50 MG) BY MOUTH DAILY 90 tablet 1  . metoprolol succinate (TOPROL-XL) 100 MG 24 hr tablet Take 1 tablet (100 mg total) by mouth at bedtime. For high blood pressure 30 tablet 6  . Multiple Vitamins-Minerals (MULTIVITAMIN WITH MINERALS) tablet Take 1 tablet by mouth daily.    . potassium chloride SA (K-DUR,KLOR-CON) 20 MEQ tablet Take 1 tablet (20 mEq total) by mouth daily. 90 tablet 1  .  sildenafil (VIAGRA) 50 MG tablet Take 1 tablet (50 mg total) by mouth daily as needed for erectile dysfunction. At least 24 hours between doses 20 tablet 0  . valACYclovir (VALTREX) 500 MG tablet Take 1 tablet (500 mg total) by mouth daily. For Herpes. 30 tablet 1  . VENTOLIN HFA 108 (90 Base) MCG/ACT inhaler INHALE 2 PUFFS INTO THE LUNGS EVERY 6 HOURS AS NEEDED FOR WHEEZING OR SHORTNESS OF BREATH 18 g 1    Musculoskeletal: Strength & Muscle Tone: within normal limits Gait & Station: normal Patient leans: N/A  Psychiatric Specialty Exam: Physical Exam  Nursing note and vitals reviewed. Constitutional: He is oriented to person, place, and time. He appears well-developed and well-nourished.  HENT:  Head:  Normocephalic.  Neck: Normal range of motion.  Respiratory: Effort normal.  Musculoskeletal: Normal range of motion.  Neurological: He is alert and oriented to person, place, and time.  Psychiatric: His speech is normal and behavior is normal. Thought content normal. Cognition and memory are normal. He expresses impulsivity. He exhibits a depressed mood.    Review of Systems  Psychiatric/Behavioral: Positive for depression. Negative for hallucinations, memory loss, substance abuse and suicidal ideas. The patient is not nervous/anxious and does not have insomnia.   All other systems reviewed and are negative.   Blood pressure 124/76, pulse 78, temperature 98.4 F (36.9 C), temperature source Oral, resp. rate 18, SpO2 100 %.There is no height or weight on file to calculate BMI.  General Appearance: Casual  Eye Contact:  Good  Speech:  Clear and Coherent and Normal Rate  Volume:  Normal  Mood:  Depressed  Affect:  Congruent and Depressed  Thought Process:  Coherent, Goal Directed and Linear  Orientation:  Full (Time, Place, and Person)  Thought Content:  Logical  Suicidal Thoughts:  Yes.  without intent/plan. SI is passive and chronic due to homelessness.   Homicidal Thoughts:  No  Memory:  Immediate;   Good Recent;   Good Remote;   Fair  Judgement:  Fair  Insight:  Fair  Psychomotor Activity:  Normal  Concentration:  Concentration: Good and Attention Span: Good  Recall:  Good  Fund of Knowledge:  Good  Language:  Good  Akathisia:  No  Handed:  Right  AIMS (if indicated):   N/A  Assets:  Communication Skills  ADL's:  Intact  Cognition:  WNL  Sleep:   N/A     Treatment Plan Summary: Plan Major depressive disorder, recurrent ([place]) Discharge Home Follow up with outpatient resources provided to you Follow up with Peer Support resources in the community Avoid the use of alcohol and illicit drugs  Disposition: No evidence of imminent risk to self or others at present.    Patient does not meet criteria for psychiatric inpatient admission. Supportive therapy provided about ongoing stressors. Discussed crisis plan, support from social network, calling 911, coming to the Emergency Department, and calling Suicide Hotline.  Ethelene Hal, NP 02/26/2018 12:28 PM   Patient seen face-to-face for psychiatric evaluation, chart reviewed and case discussed with the physician extender and developed treatment plan. Reviewed the information documented and agree with the treatment plan.  Buford Dresser, DO 02/26/18 6:14 PM

## 2018-02-26 NOTE — Patient Outreach (Signed)
ED Peer Support Specialist Patient Intake (Complete at intake & 30-60 Day Follow-up)  Name: Ryan Long  MRN: [id number]  Age: 57 y.o.   Date of Admission: 02/26/2018  Intake: Initial Comments:      Primary Reason Admitted:male that presents this date with thoughts of self harm with a plan to run into traffic. Patient states he has recently lost his housing and reports ongoing depression with symptoms to include: excessive sadness, guilt and isolating. Patient denies having a current OP provider and has a extensive history with area providers presenting with similar symptoms. Per chart review patient was [facility] at [facility] on [facility]. Patient presented that date with S/I with a plan to run into traffic that date. Patient has a poor history with following up with OP providers to assist with aftercare. Patient states he is currently homeless and reports ongoing cocaine use reporting using 1 gram two to three times a week. Patient cannot recall last use with UDS pending this date. Patient denies any other illicit substance abuse or current with withdrawals. Patient is oriented to time place and denies any H/I or AVH. Per notes, patient presents with complaint of worsening depression and suicidal ideation. He was recently discharged from a area provider but cannot recall the name of that facility. He states he is now once again thinking of jumping in front of a bus. Patient admits to drinking alcohol, doing crack, and using marijuana. Per history review, patient has a history of HIV, bipolar disorder, CHF, and polysubstance use.Patient reports today he has new onset of suicidal ideation. Patient reports his plan is to walk into traffic. Patient had several stressors such as losing his home. Denies take any medication prior to arrival. Case was staffed with Ryan Long who recommended patient be monitored and observed for safety. Patient will also be evaluated for medication management. Patient will be  seen by psychiatry in the a.m      Lab values: Alcohol/ETOH: Negative Positive UDS? No Amphetamines: No Barbiturates: No Benzodiazepines: No Cocaine: No Opiates: No Cannabinoids: No  Demographic information: Gender: Male Ethnicity: African American Marital Status: Single Insurance Status: Medicaid(Medicare) Ecologist (Work Neurosurgeon, Physicist, medical, etc.: Yes(SSI and SSDI) Lives with: Alone Living situation: Homeless shelter  Reported Patient History: Patient reported health conditions: ADD/ADHD, Depression(ICD Heart Failure ) Patient aware of HIV and hepatitis status: Yes (comment)(HIV)  In past year, has patient visited ED for any reason? Yes  Number of ED visits:    Reason(s) for visit: [place] maker   In past year, has patient been hospitalized for any reason? No  Number of hospitalizations:    Reason(s) for hospitalization:    In past year, has patient been arrested? No  Number of arrests:    Reason(s) for arrest:    In past year, has patient been incarcerated? No  Number of incarcerations:    Reason(s) for incarceration:    In past year, has patient received medication-assisted treatment? No  In past year, patient received the following treatments:    In past year, has patient received any harm reduction services? No  Did this include any of the following?    In past year, has patient received care from a mental health provider for diagnosis other than SUD? No  In past year, is this first time patient has overdosed? No  Number of past overdoses:    In past year, is this first time patient has been hospitalized for an overdose? No  Number of  hospitalizations for overdose(s):    Is patient currently receiving treatment for a mental health diagnosis? No  Patient reports experiencing difficulty participating in SUD treatment: No    Most important reason(s) for this difficulty?    Has patient received prior services for  treatment? No  In past, patient has received services from following agencies:    Plan of Care:  Suggested follow up at these agencies/treatment centers: [facility]  Other information: CPSS Ryan Long and myself met with Pt for motivational interviewing, and to complete the assessment forms. CPSS was able to speak with Pt to find out what plan of care Pt wants to work towards. CPSS made Pt aware of a few options that maybe an option for him. CPSS discussed with Pt where he maybe able to go to seek housing and to look into stable housing. CPSS addressed the fact that Pt maybe able to contact [employer] for housing.     Ryan Long, CPSS  02/26/2018 11:29 AM

## 2018-02-26 NOTE — [facility] Suicide Risk Assessment (Cosign Needed)
Suicide Risk Assessment  Discharge Assessment   [facility] Discharge Suicide Risk Assessment   Principal Problem: Major depressive disorder, recurrent [facility]) Discharge Diagnoses:  Patient Active Problem List   Diagnosis Date Noted  . NICM (nonischemic cardiomyopathy) (HCC) [I42.8] 01/08/2018  . Anxiety [F41.9] 12/20/2017  . Bipolar 1 disorder (HCC) [F31.9] 10/10/2017  . Breast lump [N63.0] 10/03/2017  . Insomnia [G47.00] 07/16/2017  . Genital herpes [A60.00] 05/13/2017  . HSV-2 infection [B00.9] 07/17/2016  . 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 Screening examination for venereal disease [Z11.3] 04/18/2016  . Encounter for long-term (current) use of medications [Z79.899] 04/18/2016  . History of attempted suicide [Z91.5] 04/14/2016  . History of alcoholism (HCC) [F10.21] 04/14/2016  . History of substance abuse [Z87.898] 04/14/2016  . Major depressive disorder, recurrent (HCC) [F33.9] 10/08/2015  . COPD (chronic obstructive pulmonary disease) (HCC) [J44.9] 10/04/2015  . Abnormal thyroid stimulating hormone (TSH) level [R79.89] 10/04/2015  . Severe episode of recurrent major depressive disorder, without psychotic features (HCC) [F33.2]   . MDD (major depressive disorder), recurrent episode, severe (HCC) [F33.2] 09/23/2015  . HTN (hypertension) [I10] 09/10/2015  . MDD (major depressive disorder), recurrent, severe, with psychosis (HCC) [F33.3] 08/07/2015  . PTSD (post-traumatic stress disorder) [F43.10] 08/07/2015  . Cocaine use disorder, severe, dependence (HCC) [F14.20] 08/07/2015  . Alcohol use disorder, severe, dependence (HCC) [F10.20] 08/07/2015  . Tobacco use disorder [F17.200] 08/07/2015  . Suicidal ideation [R45.851]   . Stimulant use disorder [F15.90] 05/29/2014  . Generalized anxiety disorder [F41.1] 05/29/2014  . Nondependent cannabis abuse, episodic [F12.10] 02/16/2014  . Polysubstance  (excluding opioids) dependence with physiological dependence (HCC) [F19.20] 12/30/2013  . Substance induced mood disorder (HCC) [F19.94] 11/22/2013  . Cocaine abuse, episodic use (HCC) [F14.10] 03/20/2012    Class: Acute  . Alcohol dependence (HCC) [F10.20] 03/20/2012    Class: Acute  . Heart failure (HCC) [I50.9] 09/19/2011  . Neutropenia (HCC) [D70.9] 09/19/2011  . Chronic systolic heart failure (HCC) [I50.22]   . Cardiomyopathy, nonischemic (HCC) [I42.8] 08/24/2011  . Human immunodeficiency virus (HIV) disease (HCC) [B20] 02/01/2009  . Recurrent HSV (herpes simplex virus) [B00.9] 02/01/2009  . HYPERLIPIDEMIA [E78.5] 02/01/2009  . ALLERGIC RHINITIS [J30.9] 02/01/2009    Total Time spent with patient: 45 minutes  Musculoskeletal: Strength & Muscle Tone: within normal limits Gait & Station: normal Patient leans: N/A  Psychiatric Specialty Exam: Physical Exam  Constitutional: He is oriented to person, place, and time. He appears well-developed and well-nourished.  HENT:  Head: Normocephalic.  Respiratory: Effort normal.  Musculoskeletal: Normal range of motion.  Neurological: He is alert and oriented to person, place, and time.  Psychiatric: His speech is normal and behavior is normal. Thought content normal. Cognition and memory are normal. He expresses impulsivity. He exhibits a depressed mood.   Review of Systems  Psychiatric/Behavioral: Positive for depression. Negative for hallucinations, memory loss, substance abuse and suicidal ideas. The patient is not nervous/anxious and does not have insomnia.   All other systems reviewed and are negative.  Blood pressure 124/76, pulse 78, temperature 98.4 F (36.9 C), temperature source Oral, resp. rate 18, SpO2 100 %.There is no height or weight on file to calculate BMI. General Appearance: Casual Eye Contact:  Good Speech:  Clear and Coherent and Normal Rate Volume:  Normal Mood:  Depressed Affect:  Congruent and  Depressed Thought Process:  Coherent, Goal Directed and Linear Orientation:  Full (Time, Place, and Person) Thought Content:  Logical Suicidal Thoughts:  Yes.  without intent/plan Homicidal Thoughts:  No Memory:  Immediate;   Good Recent;   Good Remote;   Fair Judgement:  Fair Insight:  Fair Psychomotor Activity:  Normal Concentration:  Concentration: Good and Attention Span: Good Recall:  Good Fund of Knowledge:  Good Language:  Good Akathisia:  No Handed:  Right AIMS (if indicated):    Assets:  Communication Skills ADL's:  Intact Cognition:  WNL   Mental Status Per Nursing Assessment::   On Admission:   Passive suicidal ideation due to being homeless  Demographic Factors:  Male, Low socioeconomic status and Unemployed  Loss Factors: Financial problems/change in socioeconomic status  Historical Factors: Impulsivity  Risk Reduction Factors:   Sense of responsibility to family  Continued Clinical Symptoms:  Depression:   Impulsivity Alcohol/Substance Abuse/Dependencies Previous Psychiatric Diagnoses and Treatments Medical Diagnoses and Treatments/Surgeries  Cognitive Features That Contribute To Risk:  Closed-mindedness    Suicide Risk:  Minimal: No identifiable suicidal ideation.  Patients presenting with no risk factors but with morbid ruminations; may be classified as minimal risk based on the severity of the depressive symptoms    Plan Of Care/Follow-up recommendations:  Activity:  as tolerated Diet:  Heart Healthy  Laveda Abbe, NP 02/26/2018, 12:28 PM

## 2018-02-26 NOTE — ED Notes (Signed)
Pt denies SI/HI/AVH. Pt given discharge instructions including f/u appointments. Pt states understanding. Pt states receipt of all belongings.   

## 2018-02-26 NOTE — Discharge Instructions (Signed)
For your mental health needs, you are advised to follow up with [facility].  New and returning patients are seen at their walk-in clinic.  Walk-in hours are Monday - Friday from 8:00 am - 3:00 pm.  Walk-in patients are seen on a first come, first served basis.  Try to arrive as early as possible for he best chance of being seen the same day: ° °     [facility] °     [address] °     [place], [place] [address] °     [phone] °

## 2018-02-26 NOTE — Clinical Social Work Note (Signed)
Clinical Social Work Assessment  Patient Details  Name: Ryan Long MRN: [id number] Date of Birth: 1961/03/03  Date of referral:  02/26/18               Reason for consult:  Housing Concerns/Homelessness                Permission sought to share information with:    Permission granted to share information::     Name::        Agency::     Relationship::     Contact Information:     Housing/Transportation Living arrangements for the past 2 months:  Hotel/Motel Source of Information:  Patient Patient Interpreter Needed:  None Criminal Activity/Legal Involvement Pertinent to Current Situation/Hospitalization:    Significant Relationships:  None Lives with:  Self Do you feel safe going back to the place where you live?    Need for family participation in patient care:     Care giving concerns:  Patient presented to ED with thoughts of suicide by walking into traffic due to the place he was living being condemned.    Social Worker assessment / plan:  CSW intern completed assessment and consult for discharge planning. This Clinical research associate asked patient where he was living before coming to the hospital, patient told Clinical research associate he has been living at a hotel for about two weeks. Patient also informed this Clinical research associate that he receives about $700 a month in disability.   CSW intern assessed for supports outside the hospital and patient stated he has no supports or family.  Intern discussed patient's plan post discharge as he is unable to return to his boarding room or hotel due to safety and funding, thus patient was presented with a list of shelter resources in effort to assist him with safe housing.  Patient observed becoming agitated because the shelters may not have any beds for him and he is anxious because he wants a safe place to stay. Intern validated current emotions and offered additional resources such as [facility], [facility], and [facility] as well as a bus pass for transportation.  Patient  was offered phone to call on his behalf to arrange for housing this evening, however patient remained irritated and stated he would try.    Plan: Patient to be discharged today as he was cleared by psychiatric team.  Resources have been given.   Employment status:  Disabled (Comment on whether or not currently receiving Disability)(Currently recieving disability) Insurance information:  Medicare PT Recommendations:   Not assessed at this time. Patient at baseline.  Information / Referral to community resources:    list of shelters, bus pass, and day center.  Patient/Family's Response to care:  Patient was calm, but agitated that this writer was not providing guaranteed housing.   Patient/Family's Understanding of and Emotional Response to Diagnosis, Current Treatment, and Prognosis:  Patient appeared to be agitated and anxious due to shelters possibly not having beds available. Patient has used the majority of his finances on substances and the hotel he was staying at before his hospital stay. Patient understands his limited finances at the moment.  Emotional Assessment Appearance:  Appears stated age Attitude/Demeanor/Rapport:    Affect (typically observed):  Frustrated, Agitated, Blunt Orientation:  Oriented to Self, Oriented to Situation, Oriented to Place, Oriented to  Time Alcohol / Substance use:    Psych involvement (Current and /or in the community):  Yes (Comment)  Discharge Needs  Concerns to be addressed:  Homelessness Readmission within  the last 30 days:    Current discharge risk:  None Barriers to Discharge:  No Barriers Identified   Ulis Rias, Student-Social Work 02/26/2018, 12:10 PM

## 2018-03-01 ENCOUNTER — Encounter: Payer: Self-pay | Admitting: *Deleted

## 2018-03-04 DIAGNOSIS — I10 Essential (primary) hypertension: Secondary | ICD-10-CM | POA: Diagnosis not present

## 2018-03-04 DIAGNOSIS — Z590 Homelessness: Secondary | ICD-10-CM | POA: Diagnosis not present

## 2018-03-04 DIAGNOSIS — G4700 Insomnia, unspecified: Secondary | ICD-10-CM | POA: Diagnosis not present

## 2018-03-04 DIAGNOSIS — R45851 Suicidal ideations: Secondary | ICD-10-CM | POA: Diagnosis not present

## 2018-03-04 DIAGNOSIS — N179 Acute kidney failure, unspecified: Secondary | ICD-10-CM | POA: Diagnosis not present

## 2018-03-05 ENCOUNTER — Other Ambulatory Visit: Payer: Self-pay | Admitting: Infectious Disease

## 2018-03-05 DIAGNOSIS — B20 Human immunodeficiency virus [HIV] disease: Secondary | ICD-10-CM

## 2018-03-12 ENCOUNTER — Encounter: Payer: Medicare Other | Admitting: Cardiology

## 2018-03-12 NOTE — Progress Notes (Deleted)
Electrophysiology Office Note   Date:  03/12/2018   ID:  Ryan Long, DOB 18-Jan-1961, MRN [id number]  PCP:  Hoy Register, MD  Cardiologist:  Bensimhon Primary Electrophysiologist:  Will [name], MD    No chief complaint on file.    History of Present Illness: Ryan Long is a 57 y.o. male who is being seen today for the evaluation of CHF at the request of [name]. Presenting today for electrophysiology evaluation. He has a history of tobacco abuse, alcohol abuse, bipolar 1, nonischemic cardiomyopathy, COPD, HIV, hyperlipidemia, hypertension.  His nonischemic cardiomyopathy was not felt due to HIV due to good CD4 counts.  He is no longer using cocaine.  He is try to cut back on smoking.  He is currently living in a shelter in [place].  He had a Saint Jude ICD implanted 01/08/18.   Today, denies symptoms of palpitations, chest pain, shortness of breath, orthopnea, PND, lower extremity edema, claudication, dizziness, presyncope, syncope, bleeding, or neurologic sequela. The patient is tolerating medications without difficulties. ***   Past Medical History:  Diagnosis Date  . Active smoker   . AICD (automatic cardioverter/defibrillator) present   . Alcohol abuse   . Anxiety   . AR (allergic rhinitis)   . Bipolar 1 disorder (HCC)   . CHF (congestive heart failure) (HCC)   . Chronic bronchitis (HCC)   . Chronic systolic heart failure (HCC)   . COPD (chronic obstructive pulmonary disease) (HCC)   . Crack cocaine use   . Depression   . Genital herpes   . HIV (human immunodeficiency virus infection) (HCC) dx'd 08/1993  . HLD (hyperlipidemia)   . Hypertension   . NICM (nonischemic cardiomyopathy) (HCC)    Echocardiogram 06/28/11: EF 30-35%, mild MR, mild LAE;  No CAD by coronary CT angiogram 3/12 at [facility]  . NSVT (nonsustained ventricular tachycardia) (HCC)   . PTSD (post-traumatic stress disorder)    Past Surgical History:    Procedure Laterality Date  . CARDIAC DEFIBRILLATOR PLACEMENT  01/08/2018  . ICD IMPLANT N/A 01/08/2018   Procedure: ICD IMPLANT;  Surgeon: Regan Lemming, MD;  Location: [facility] INVASIVE CV LAB;  Service: Cardiovascular;  Laterality: N/A;     Current Outpatient Medications  Medication Sig Dispense Refill  . aspirin 81 MG chewable tablet Chew 1 tablet (81 mg total) by mouth every morning. For heart health    . bictegravir-emtricitabine-tenofovir AF (BIKTARVY) 50-200-25 MG TABS tablet Take 1 tablet by mouth daily.    . digoxin (DIGOX) 0.125 MG tablet TAKE 1 TABLET BY MOUTH DAILY( FOR CONTROL OF HEART ARRYTHMIA) 30 tablet 3  . eplerenone (INSPRA) 25 MG tablet Take 0.5 tablets (12.5 mg total) by mouth daily. 45 tablet 3  . furosemide (LASIX) 40 MG tablet Take 1 tablet (40 mg total) by mouth daily. 90 tablet 1  . hydrOXYzine (ATARAX/VISTARIL) 25 MG tablet Take 1-2 tablets (25-50 mg total) by mouth at bedtime. (Patient taking differently: Take 25-50 mg by mouth 2 (two) times daily as needed for anxiety. ) 30 tablet 1  . losartan (COZAAR) 50 MG tablet TAKE 1 TABLET(50 MG) BY MOUTH DAILY 90 tablet 1  . metoprolol succinate (TOPROL-XL) 100 MG 24 hr tablet Take 1 tablet (100 mg total) by mouth at bedtime. For high blood pressure 30 tablet 6  . Multiple Vitamins-Minerals (MULTIVITAMIN WITH MINERALS) tablet Take 1 tablet by mouth daily.    . potassium chloride SA (K-DUR,KLOR-CON) 20 MEQ tablet Take 1 tablet (20  mEq total) by mouth daily. 90 tablet 1  . sildenafil (VIAGRA) 50 MG tablet Take 1 tablet (50 mg total) by mouth daily as needed for erectile dysfunction. At least 24 hours between doses 20 tablet 0  . valACYclovir (VALTREX) 500 MG tablet Take 1 tablet (500 mg total) by mouth daily. For Herpes. 30 tablet 1  . VENTOLIN HFA 108 (90 Base) MCG/ACT inhaler INHALE 2 PUFFS INTO THE LUNGS EVERY 6 HOURS AS NEEDED FOR WHEEZING OR SHORTNESS OF BREATH 18 g 1   No current facility-administered medications for  this visit.     Allergies:   Patient has no known allergies.   Social History:  The patient  reports that he has been smoking cigarettes.  He has a 19.00 pack-year smoking history. He has never used smokeless tobacco. He reports that he drinks about 3.6 oz of alcohol per week. He reports that he has current or past drug history. Drug: Cocaine.   Family History:  The patient's family history includes CAD in his father; Hypertension in his father.   ROS:  Please see the history of present illness.   Otherwise, review of systems is positive for ***.   All other systems are reviewed and negative.   PHYSICAL EXAM: VS:  There were no vitals taken for this visit. , BMI There is no height or weight on file to calculate BMI. GEN: Well nourished, well developed, in no acute distress  HEENT: normal  Neck: no JVD, carotid bruits, or masses Cardiac: ***RRR; no murmurs, rubs, or gallops,no edema  Respiratory:  clear to auscultation bilaterally, normal work of breathing GI: soft, nontender, nondistended, + BS MS: no deformity or atrophy  Skin: warm and dry, ***device site well healed Neuro:  Strength and sensation are intact Psych: euthymic mood, full affect  EKG:  EKG {ACTION; IS/IS [id number]} ordered today. Personal review of the ekg ordered *** shows ***  ***Personal review of the device interrogation today. Results in [facility]   Recent Labs: 02/25/2018: ALT 26; BUN 11; Creatinine, Ser 1.40; Hemoglobin 17.5; Platelets 177; Potassium 3.7; Sodium 141    Lipid Panel     Component Value Date/Time   CHOL 150 04/18/2016 1622   TRIG 193 (H) 04/18/2016 1622   HDL 61 04/18/2016 1622   CHOLHDL 2.5 04/18/2016 1622   VLDL 39 (H) 04/18/2016 1622   LDLCALC 50 04/18/2016 1622     Wt Readings from Last 3 Encounters:  02/25/18 207 lb 12.8 oz (94.3 kg)  02/01/18 198 lb 9.6 oz (90.1 kg)  01/25/18 205 lb 3.2 oz (93.1 kg)      Other studies Reviewed: Additional studies/ records that were  reviewed today include: TTE 10/10/17  Review of the above records today demonstrates:  - Left ventricle: The cavity size was normal. Wall thickness was   normal. Systolic function was severely reduced. The estimated   ejection fraction was in the range of 25% to 30%. Diffuse   hypokinesis. Doppler parameters are consistent with abnormal left   ventricular relaxation (grade 1 diastolic dysfunction).   ASSESSMENT AND PLAN:  1.  Chronic systolic heart failure due to nonischemic cardiomyopathy: Currently on optimal medical therapy.  Had a Saint Jude ICD implanted 01/08/18.***  2.  HIV: Follow-up with Dr. Algis Liming.  No changes.***  3.  Tobacco abuse: Encouraged complete cessation***  4.  Cocaine abuse: Congratulated on continued abstinence.***  Current medicines are reviewed at length with the patient today.   The patient does not have concerns regarding his  medicines.  The following changes were made today:  ***  Labs/ tests ordered today include:  No orders of the defined types were placed in this encounter.    Disposition:   FU with Will Camnitz *** months  Signed, Will [name], MD  03/12/2018 8:30 AM     [facility] [address] [place] [address] [phone] (office) [phone] (fax)

## 2018-03-19 ENCOUNTER — Other Ambulatory Visit: Payer: Self-pay

## 2018-03-19 ENCOUNTER — Encounter (HOSPITAL_COMMUNITY): Payer: Self-pay

## 2018-03-19 ENCOUNTER — Emergency Department (HOSPITAL_COMMUNITY): Payer: Medicare Other

## 2018-03-19 ENCOUNTER — Emergency Department (HOSPITAL_COMMUNITY)
Admission: EM | Admit: 2018-03-19 | Discharge: 2018-03-20 | Disposition: A | Discharge status: Home / Self Care | Payer: Medicare Other | Attending: Emergency Medicine | Admitting: Emergency Medicine

## 2018-03-19 DIAGNOSIS — R05 Cough: Secondary | ICD-10-CM | POA: Diagnosis not present

## 2018-03-19 DIAGNOSIS — R0602 Shortness of breath: Secondary | ICD-10-CM | POA: Diagnosis not present

## 2018-03-19 DIAGNOSIS — Z7982 Long term (current) use of aspirin: Secondary | ICD-10-CM | POA: Insufficient documentation

## 2018-03-19 DIAGNOSIS — F17210 Nicotine dependence, cigarettes, uncomplicated: Secondary | ICD-10-CM | POA: Diagnosis not present

## 2018-03-19 DIAGNOSIS — I5022 Chronic systolic (congestive) heart failure: Secondary | ICD-10-CM | POA: Diagnosis not present

## 2018-03-19 DIAGNOSIS — J111 Influenza due to unidentified influenza virus with other respiratory manifestations: Secondary | ICD-10-CM | POA: Diagnosis not present

## 2018-03-19 DIAGNOSIS — Z79899 Other long term (current) drug therapy: Secondary | ICD-10-CM | POA: Insufficient documentation

## 2018-03-19 DIAGNOSIS — J449 Chronic obstructive pulmonary disease, unspecified: Secondary | ICD-10-CM | POA: Insufficient documentation

## 2018-03-19 DIAGNOSIS — I110 Hypertensive heart disease with heart failure: Secondary | ICD-10-CM | POA: Diagnosis not present

## 2018-03-19 DIAGNOSIS — R062 Wheezing: Secondary | ICD-10-CM | POA: Diagnosis not present

## 2018-03-19 DIAGNOSIS — J209 Acute bronchitis, unspecified: Secondary | ICD-10-CM

## 2018-03-19 DIAGNOSIS — R69 Illness, unspecified: Secondary | ICD-10-CM

## 2018-03-19 DIAGNOSIS — Z95810 Presence of automatic (implantable) cardiac defibrillator: Secondary | ICD-10-CM | POA: Diagnosis not present

## 2018-03-19 LAB — CBC WITH DIFFERENTIAL/PLATELET
Basophils Absolute: 0 10*3/uL (ref 0.0–0.1)
Basophils Relative: 1 %
EOS ABS: 0 10*3/uL (ref 0.0–0.7)
Eosinophils Relative: 1 %
HEMATOCRIT: 48.1 % (ref 39.0–52.0)
HEMOGLOBIN: 17.4 g/dL — AB (ref 13.0–17.0)
LYMPHS ABS: 1.7 10*3/uL (ref 0.7–4.0)
LYMPHS PCT: 41 %
MCH: 33.7 pg (ref 26.0–34.0)
MCHC: 36.2 g/dL — AB (ref 30.0–36.0)
MCV: 93 fL (ref 78.0–100.0)
MONOS PCT: 10 %
Monocytes Absolute: 0.4 10*3/uL (ref 0.1–1.0)
NEUTROS ABS: 2.1 10*3/uL (ref 1.7–7.7)
NEUTROS PCT: 49 %
Platelets: 166 10*3/uL (ref 150–400)
RBC: 5.17 MIL/uL (ref 4.22–5.81)
RDW: 13 % (ref 11.5–15.5)
WBC: 4.3 10*3/uL (ref 4.0–10.5)

## 2018-03-19 NOTE — ED Triage Notes (Signed)
Pt presents with 3-4 day h/o cough with yellow phlegm, wheezing, chills and shortness of breath.

## 2018-03-20 ENCOUNTER — Ambulatory Visit: Payer: Medicare Other | Admitting: Family Medicine

## 2018-03-20 DIAGNOSIS — J111 Influenza due to unidentified influenza virus with other respiratory manifestations: Secondary | ICD-10-CM | POA: Diagnosis not present

## 2018-03-20 LAB — BASIC METABOLIC PANEL
Anion gap: 11 (ref 5–15)
BUN: 12 mg/dL (ref 6–20)
CALCIUM: 8.8 mg/dL — AB (ref 8.9–10.3)
CO2: 21 mmol/L — AB (ref 22–32)
Chloride: 107 mmol/L (ref 101–111)
Creatinine, Ser: 1.18 mg/dL (ref 0.61–1.24)
GFR calc Af Amer: >60 mL/min (ref >60)
GLUCOSE: 94 mg/dL (ref 65–99)
Potassium: 3.9 mmol/L (ref 3.5–5.1)
Sodium: 139 mmol/L (ref 135–145)

## 2018-03-20 LAB — I-STAT TROPONIN, ED: Troponin i, poc: 0.01 ng/mL (ref 0.00–0.08)

## 2018-03-20 MED ORDER — IPRATROPIUM-ALBUTEROL 0.5-2.5 (3) MG/3ML IN SOLN
3.0000 mL | Freq: Once | RESPIRATORY_TRACT | Status: AC
  Administered 2018-03-20: 3 mL via RESPIRATORY_TRACT
  Filled 2018-03-20: qty 3

## 2018-03-20 MED ORDER — ALBUTEROL SULFATE HFA 108 (90 BASE) MCG/ACT IN AERS
2.0000 | INHALATION_SPRAY | RESPIRATORY_TRACT | Status: DC | PRN
  Administered 2018-03-20: 2 via RESPIRATORY_TRACT
  Filled 2018-03-20: qty 6.7

## 2018-03-20 MED ORDER — IBUPROFEN 400 MG PO TABS
400.0000 mg | ORAL_TABLET | Freq: Once | ORAL | Status: AC
  Administered 2018-03-20: 400 mg via ORAL
  Filled 2018-03-20: qty 1

## 2018-03-20 MED ORDER — PREDNISONE 20 MG PO TABS
60.0000 mg | ORAL_TABLET | Freq: Once | ORAL | Status: AC
  Administered 2018-03-20: 60 mg via ORAL
  Filled 2018-03-20: qty 3

## 2018-03-20 MED ORDER — PREDNISONE 50 MG PO TABS: 50.0000 mg | ORAL_TABLET | Freq: Every day | ORAL | 0 refills | Status: DC

## 2018-03-20 NOTE — Discharge Instructions (Signed)
Drink plenty of fluids. Take ibuprofen or naproxen or acetaminophen as needed for fever or aching.

## 2018-03-20 NOTE — ED Provider Notes (Signed)
[facility] EMERGENCY DEPARTMENT Provider Note   CSN: [id number] Arrival date & time: 03/19/18  2312     History   Chief Complaint No chief complaint on file.   HPI Ryan Long is a 57 y.o. male.  The history is provided by the patient.  He has history of HIV, hypertension, CHF, COPD, implanted cardiac defibrillator, polysubstance abuse and comes in with a 4-day history of cough productive of yellow to green sputum.  He is also complaining of some shortness of breath with wheezing.  He has had chills and sweats but no known fevers.  He is complaining of generalized body aches which he rates at 7/10.  He has had sick contacts.  He has not tried anything to treat this at home.  He states he did get the influenza immunization this season.  Past Medical History:  Diagnosis Date  . Active smoker   . AICD (automatic cardioverter/defibrillator) present   . Alcohol abuse   . Anxiety   . AR (allergic rhinitis)   . Bipolar 1 disorder (HCC)   . CHF (congestive heart failure) (HCC)   . Chronic bronchitis (HCC)   . Chronic systolic heart failure (HCC)   . COPD (chronic obstructive pulmonary disease) (HCC)   . Crack cocaine use   . Depression   . Genital herpes   . HIV (human immunodeficiency virus infection) (HCC) dx'd 08/1993  . HLD (hyperlipidemia)   . Hypertension   . NICM (nonischemic cardiomyopathy) (HCC)    Echocardiogram 06/28/11: EF 30-35%, mild MR, mild LAE;  No CAD by coronary CT angiogram 3/12 at [facility]  . NSVT (nonsustained ventricular tachycardia) (HCC)   . PTSD (post-traumatic stress disorder)     Patient Active Problem List   Diagnosis Date Noted  . Adjustment disorder with mixed disturbance of emotions and conduct   . NICM (nonischemic cardiomyopathy) (HCC) 01/08/2018  . Anxiety 12/20/2017  . Bipolar 1 disorder (HCC) 10/10/2017  . Breast lump 10/03/2017  . Insomnia 07/16/2017  . Genital herpes 05/13/2017  . HSV-2  infection 07/17/2016  . 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 Screening examination for venereal disease 04/18/2016  . Encounter for long-term (current) use of medications 04/18/2016  . History of attempted suicide 04/14/2016  . History of alcoholism (HCC) 04/14/2016  . History of substance abuse 04/14/2016  . Major depressive disorder, recurrent (HCC) 10/08/2015  . COPD (chronic obstructive pulmonary disease) (HCC) 10/04/2015  . Abnormal thyroid stimulating hormone (TSH) level 10/04/2015  . Severe episode of recurrent major depressive disorder, without psychotic features (HCC)   . MDD (major depressive disorder), recurrent episode, severe (HCC) 09/23/2015  . HTN (hypertension) 09/10/2015  . MDD (major depressive disorder), recurrent, severe, with psychosis (HCC) 08/07/2015  . PTSD (post-traumatic stress disorder) 08/07/2015  . Cocaine use disorder, severe, dependence (HCC) 08/07/2015  . Alcohol use disorder, severe, dependence (HCC) 08/07/2015  . Tobacco use disorder 08/07/2015  . Suicidal ideation   . Stimulant use disorder 05/29/2014  . Generalized anxiety disorder 05/29/2014  . Nondependent cannabis abuse, episodic 02/16/2014  . Polysubstance (excluding opioids) dependence with physiological dependence (HCC) 12/30/2013  . Substance induced mood disorder (HCC) 11/22/2013  . Cocaine abuse, episodic use (HCC) 03/20/2012    Class: Acute  . Alcohol dependence (HCC) 03/20/2012    Class: Acute  . Heart failure (HCC) 09/19/2011  . Neutropenia (HCC) 09/19/2011  . Chronic systolic heart failure (HCC)   . Cardiomyopathy, nonischemic (HCC) 08/24/2011  .  Human immunodeficiency virus (HIV) disease (HCC) 02/01/2009  . Recurrent HSV (herpes simplex virus) 02/01/2009  . HYPERLIPIDEMIA 02/01/2009  . ALLERGIC RHINITIS 02/01/2009    Past Surgical History:  Procedure Laterality Date  . CARDIAC DEFIBRILLATOR PLACEMENT   01/08/2018  . ICD IMPLANT N/A 01/08/2018   Procedure: ICD IMPLANT;  Surgeon: Regan Lemming, MD;  Location: [facility] INVASIVE CV LAB;  Service: Cardiovascular;  Laterality: N/A;        Home Medications    Prior to Admission medications   Medication Sig Start Date End Date Taking? Authorizing Provider  aspirin 81 MG chewable tablet Chew 1 tablet (81 mg total) by mouth every morning. For heart health 09/28/15   Adonis Brook, NP  bictegravir-emtricitabine-tenofovir AF (BIKTARVY) 50-200-25 MG TABS tablet Take 1 tablet by mouth daily.    [provider]  digoxin (DIGOX) 0.125 MG tablet TAKE 1 TABLET BY MOUTH DAILY( FOR CONTROL OF HEART ARRYTHMIA) 01/07/18   Bensimhon, Bevelyn Buckles, MD  eplerenone (INSPRA) 25 MG tablet Take 0.5 tablets (12.5 mg total) by mouth daily. 01/07/18   Bensimhon, Bevelyn Buckles, MD  furosemide (LASIX) 40 MG tablet Take 1 tablet (40 mg total) by mouth daily. 01/07/18   Bensimhon, Bevelyn Buckles, MD  hydrOXYzine (ATARAX/VISTARIL) 25 MG tablet Take 1-2 tablets (25-50 mg total) by mouth at bedtime. Patient taking differently: Take 25-50 mg by mouth 2 (two) times daily as needed for anxiety.  01/07/18   Bensimhon, Bevelyn Buckles, MD  losartan (COZAAR) 50 MG tablet TAKE 1 TABLET(50 MG) BY MOUTH DAILY 01/07/18   Bensimhon, Bevelyn Buckles, MD  metoprolol succinate (TOPROL-XL) 100 MG 24 hr tablet Take 1 tablet (100 mg total) by mouth at bedtime. For high blood pressure 02/25/18   Clegg, Amy D, NP  Multiple Vitamins-Minerals (MULTIVITAMIN WITH MINERALS) tablet Take 1 tablet by mouth daily.    [provider]  potassium chloride SA (K-DUR,KLOR-CON) 20 MEQ tablet Take 1 tablet (20 mEq total) by mouth daily. 01/07/18   Bensimhon, Bevelyn Buckles, MD  sildenafil (VIAGRA) 50 MG tablet Take 1 tablet (50 mg total) by mouth daily as needed for erectile dysfunction. At least 24 hours between doses 01/21/18   Hoy Register, MD  valACYclovir (VALTREX) 500 MG tablet Take 1 tablet (500 mg total) by mouth daily. For  Herpes. 02/23/18   Fawze, Mina A, PA-C  VENTOLIN HFA 108 (90 Base) MCG/ACT inhaler INHALE 2 PUFFS INTO THE LUNGS EVERY 6 HOURS AS NEEDED FOR WHEEZING OR SHORTNESS OF BREATH 08/13/17   Hoy Register, MD    Family History Family History  Problem Relation Age of Onset  . Hypertension Father   . CAD Father     Social History Social History   Tobacco Use  . Smoking status: Current Every Day Smoker    Packs/day: 0.50    Years: 38.00    Pack years: 19.00    Types: Cigarettes  . Smokeless tobacco: Never Used  Substance Use Topics  . Alcohol use: Yes    Alcohol/week: 3.6 oz    Types: 6 Cans of beer per week    Comment: 01/08/2018 "stopped 06/2017"  . Drug use: Yes    Types: Cocaine    Comment: 01/08/2018 "stopped 06/2017"     Allergies   Patient has no known allergies.   Review of Systems Review of Systems  All other systems reviewed and are negative.    Physical Exam Updated Vital Signs BP (!) 123/97   Pulse 87   Temp 98 F (36.7 C)  Resp 20   Ht 5\' 9"  (1.753 m)   Wt 90.7 kg (200 lb)   SpO2 98%   BMI 29.53 kg/m   Physical Exam  Nursing note and vitals reviewed.  57 year old male, resting comfortably and in no acute distress. Vital signs are significant for elevated diastolic blood pressure. Oxygen saturation is 98%, which is normal. Head is normocephalic and atraumatic. PERRLA, EOMI. Oropharynx is clear. Neck is nontender and supple without adenopathy or JVD. Back is nontender and there is no CVA tenderness. Lungs have diffuse expiratory wheezes without rales or rhonchi. Chest is nontender. Heart has regular rate and rhythm without murmur. Abdomen is soft, flat, nontender without masses or hepatosplenomegaly and peristalsis is normoactive. Extremities have no cyanosis or edema, full range of motion is present. Skin is warm and dry without rash. Neurologic: Mental status is normal, cranial nerves are intact, there are no motor or sensory deficits.  ED  Treatments / Results  Labs (all labs ordered are listed, but only abnormal results are displayed) Labs Reviewed  CBC WITH DIFFERENTIAL/PLATELET - Abnormal; Notable for the following components:      Result Value   Hemoglobin 17.4 (*)    MCHC 36.2 (*)    All other components within normal limits  BASIC METABOLIC PANEL - Abnormal; Notable for the following components:   CO2 21 (*)    Calcium 8.8 (*)    All other components within normal limits  I-STAT TROPONIN, ED    EKG EKG Interpretation  Date/Time:  Tuesday March 19 2018 23:16:40 EDT Ventricular Rate:  97 PR Interval:  190 QRS Duration: 84 QT Interval:  372 QTC Calculation: 472 R Axis:   70 Text Interpretation:  Normal sinus rhythm Anteroseptal infarct , age undetermined T wave abnormality, consider inferolateral ischemia Abnormal ECG When compared with ECG of 02/25/2018, T wave inversion Inferior leads is more prominent Reconfirmed by Dione Booze ([id number]) on 03/19/2018 11:57:52 PM   Radiology Dg Chest 2 View  Result Date: 03/20/2018 CLINICAL DATA:  Productive cough and shortness of breath for 4 days. EXAM: CHEST - 2 VIEW COMPARISON:  01/09/2018 FINDINGS: Cardiac pacemaker. Mild emphysematous changes in the lungs. Normal heart size and pulmonary vascularity. No focal airspace disease or consolidation in the lungs. No blunting of costophrenic angles. No pneumothorax. Mediastinal contours appear intact. IMPRESSION: No evidence of active pulmonary disease. Mild emphysematous changes. Electronically Signed   By: Burman Nieves M.D.   On: 03/20/2018 00:09    Procedures Procedures   Medications Ordered in ED Medications  albuterol (PROVENTIL HFA;VENTOLIN HFA) 108 (90 Base) MCG/ACT inhaler 2 puff (has no administration in time range)  ipratropium-albuterol (DUONEB) 0.5-2.5 (3) MG/3ML nebulizer solution 3 mL (3 mLs Nebulization Given 03/20/18 0015)  ibuprofen (ADVIL,MOTRIN) tablet 400 mg (400 mg Oral Given 03/20/18 0042)    ipratropium-albuterol (DUONEB) 0.5-2.5 (3) MG/3ML nebulizer solution 3 mL (3 mLs Nebulization Given 03/20/18 0048)  predniSONE (DELTASONE) tablet 60 mg (60 mg Oral Given 03/20/18 0048)  ipratropium-albuterol (DUONEB) 0.5-2.5 (3) MG/3ML nebulizer solution 3 mL (3 mLs Nebulization Given 03/20/18 0220)     Initial Impression / Assessment and Plan / ED Course  I have reviewed the triage vital signs and the nursing notes.  Pertinent labs & imaging results that were available during my care of the patient were reviewed by me and considered in my medical decision making (see chart for details).  Influenza-like illness with bronchospasm.  Old records are reviewed and I see no relevant past visits.  Chest x-ray shows no evidence of pneumonia.  He is given a nebulizer treatment with albuterol and ipratropium.  Following this, airflow is definitely improved, but wheezing was also  Patient did not notice any improvement with nebulizer treatment.  On exam, there did seem to be improved airflow.  He is given a second nebulizer treatment and also given dose of prednisone.  Following this, airflow is clearly improved, but wheezing was actually increased.  He is given a third nebulizer treatment.  Following this, he is feeling much better, wheezing is almost completely gone.  Labs are unremarkable.  He has chronic polycythemia which is unchanged from baseline.  WBC is low with normal differential consistent with viral illness.  He is discharged with prescriptions for prednisone and given an albuterol inhaler to take home.  Told to use over-the-counter antipyretics and analgesics as needed.  Return precautions discussed.  Final Clinical Impressions(s) / ED Diagnoses   Final diagnoses:  Influenza-like illness  Acute bronchitis, unspecified organism    ED Discharge Orders        Ordered    predniSONE (DELTASONE) 50 MG tablet  Daily     03/20/18 0251       Dione Booze, MD 03/20/18 [phone]

## 2018-03-25 ENCOUNTER — Ambulatory Visit: Payer: Medicare Other | Admitting: Family Medicine

## 2018-03-25 DIAGNOSIS — R062 Wheezing: Secondary | ICD-10-CM | POA: Diagnosis not present

## 2018-03-26 ENCOUNTER — Other Ambulatory Visit (HOSPITAL_COMMUNITY): Payer: Self-pay

## 2018-03-26 ENCOUNTER — Ambulatory Visit (INDEPENDENT_AMBULATORY_CARE_PROVIDER_SITE_OTHER): Payer: Medicare Other | Admitting: Pharmacist

## 2018-03-26 ENCOUNTER — Encounter: Payer: Self-pay | Admitting: Licensed Clinical Social Worker

## 2018-03-26 ENCOUNTER — Other Ambulatory Visit: Payer: Self-pay | Admitting: Pharmacist

## 2018-03-26 DIAGNOSIS — R300 Dysuria: Secondary | ICD-10-CM | POA: Diagnosis not present

## 2018-03-26 DIAGNOSIS — I159 Secondary hypertension, unspecified: Secondary | ICD-10-CM

## 2018-03-26 DIAGNOSIS — R9431 Abnormal electrocardiogram [ECG] [EKG]: Secondary | ICD-10-CM | POA: Diagnosis not present

## 2018-03-26 DIAGNOSIS — B20 Human immunodeficiency virus [HIV] disease: Secondary | ICD-10-CM | POA: Diagnosis not present

## 2018-03-26 DIAGNOSIS — B009 Herpesviral infection, unspecified: Secondary | ICD-10-CM

## 2018-03-26 DIAGNOSIS — R062 Wheezing: Secondary | ICD-10-CM | POA: Diagnosis not present

## 2018-03-26 DIAGNOSIS — I440 Atrioventricular block, first degree: Secondary | ICD-10-CM | POA: Diagnosis not present

## 2018-03-26 DIAGNOSIS — Z202 Contact with and (suspected) exposure to infections with a predominantly sexual mode of transmission: Secondary | ICD-10-CM | POA: Diagnosis not present

## 2018-03-26 DIAGNOSIS — R369 Urethral discharge, unspecified: Secondary | ICD-10-CM | POA: Diagnosis not present

## 2018-03-26 MED ORDER — BICTEGRAVIR-EMTRICITAB-TENOFOV 50-200-25 MG PO TABS: 1.0000 | ORAL_TABLET | Freq: Every day | ORAL | 11 refills | Status: DC

## 2018-03-26 MED ORDER — METOPROLOL SUCCINATE ER 100 MG PO TB24: 100.0000 mg | ORAL_TABLET | Freq: Every day | ORAL | 11 refills | Status: DC

## 2018-03-26 MED ORDER — VALACYCLOVIR HCL 500 MG PO TABS: 500.0000 mg | ORAL_TABLET | Freq: Every day | ORAL | 1 refills | Status: DC

## 2018-03-26 MED ORDER — HYDROXYZINE HCL 25 MG PO TABS: 25.0000 mg | ORAL_TABLET | Freq: Two times a day (BID) | ORAL | 11 refills | Status: DC | PRN

## 2018-03-26 MED ORDER — EPLERENONE 25 MG PO TABS: 12.5000 mg | ORAL_TABLET | Freq: Every day | ORAL | 11 refills | Status: DC

## 2018-03-26 MED ORDER — ASPIRIN 81 MG PO CHEW: 81.0000 mg | CHEWABLE_TABLET | Freq: Every morning | ORAL | 11 refills | Status: DC

## 2018-03-26 MED FILL — EPLERENONE 25 MG TABS: 25 | 30 days supply | Qty: 15 | Fill #0 | Status: TO

## 2018-03-26 MED FILL — METOPROLOL SUCCINATE ER 100: 100 | 30 days supply | Qty: 30 | Fill #0 | Status: TO

## 2018-03-26 MED FILL — BIKTARVY 50-200-25 MG TABS: 50-200-25 | 30 days supply | Qty: 30 | Fill #0 | Status: TO

## 2018-03-26 MED FILL — VALACYCLOVIR HCL 500 MG TAB: 500 | 30 days supply | Qty: 30 | Fill #0 | Status: TO

## 2018-03-26 MED FILL — hydrOXYzine HCL 25 MG TABS: 25 | 15 days supply | Qty: 60 | Fill #0 | Status: TO

## 2018-03-26 NOTE — Progress Notes (Signed)
[facility] Pharmacy Visit  HPI: Ryan Long is a 57 y.o. male who presents to the [facility] pharmacy clinic today for help obtaining his HIV medications.  Patient Active Problem List   Diagnosis Date Noted  . Adjustment disorder with mixed disturbance of emotions and conduct   . NICM (nonischemic cardiomyopathy) (HCC) 01/08/2018  . Anxiety 12/20/2017  . Bipolar 1 disorder (HCC) 10/10/2017  . Breast lump 10/03/2017  . Insomnia 07/16/2017  . Genital herpes 05/13/2017  . HSV-2 infection 07/17/2016  . 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 Screening examination for venereal disease 04/18/2016  . Encounter for long-term (current) use of medications 04/18/2016  . History of attempted suicide 04/14/2016  . History of alcoholism (HCC) 04/14/2016  . History of substance abuse 04/14/2016  . Major depressive disorder, recurrent (HCC) 10/08/2015  . COPD (chronic obstructive pulmonary disease) (HCC) 10/04/2015  . Abnormal thyroid stimulating hormone (TSH) level 10/04/2015  . Severe episode of recurrent major depressive disorder, without psychotic features (HCC)   . MDD (major depressive disorder), recurrent episode, severe (HCC) 09/23/2015  . HTN (hypertension) 09/10/2015  . MDD (major depressive disorder), recurrent, severe, with psychosis (HCC) 08/07/2015  . PTSD (post-traumatic stress disorder) 08/07/2015  . Cocaine use disorder, severe, dependence (HCC) 08/07/2015  . Alcohol use disorder, severe, dependence (HCC) 08/07/2015  . Tobacco use disorder 08/07/2015  . Suicidal ideation   . Stimulant use disorder 05/29/2014  . Generalized anxiety disorder 05/29/2014  . Nondependent cannabis abuse, episodic 02/16/2014  . Polysubstance (excluding opioids) dependence with physiological dependence (HCC) 12/30/2013  . Substance induced mood disorder (HCC) 11/22/2013  . Cocaine abuse, episodic use  (HCC) 03/20/2012    Class: Acute  . Alcohol dependence (HCC) 03/20/2012    Class: Acute  . Heart failure (HCC) 09/19/2011  . Neutropenia (HCC) 09/19/2011  . Chronic systolic heart failure (HCC)   . Cardiomyopathy, nonischemic (HCC) 08/24/2011  . Human immunodeficiency virus (HIV) disease (HCC) 02/01/2009  . Recurrent HSV (herpes simplex virus) 02/01/2009  . HYPERLIPIDEMIA 02/01/2009  . ALLERGIC RHINITIS 02/01/2009    Patient's Medications  New Prescriptions   No medications on file  Previous Medications   ASPIRIN 81 MG CHEWABLE TABLET    Chew 1 tablet (81 mg total) by mouth every morning. For heart health   BICTEGRAVIR-EMTRICITABINE-TENOFOVIR AF (BIKTARVY) 50-200-25 MG TABS TABLET    Take 1 tablet by mouth daily.   DIGOXIN (DIGOX) 0.125 MG TABLET    TAKE 1 TABLET BY MOUTH DAILY( FOR CONTROL OF HEART ARRYTHMIA)   EPLERENONE (INSPRA) 25 MG TABLET    Take 0.5 tablets (12.5 mg total) by mouth daily.   FUROSEMIDE (LASIX) 40 MG TABLET    Take 1 tablet (40 mg total) by mouth daily.   HYDROXYZINE (ATARAX/VISTARIL) 25 MG TABLET    Take 1-2 tablets (25-50 mg total) by mouth 2 (two) times daily as needed for anxiety.   LOSARTAN (COZAAR) 50 MG TABLET    TAKE 1 TABLET(50 MG) BY MOUTH DAILY   METOPROLOL SUCCINATE (TOPROL-XL) 100 MG 24 HR TABLET    Take 1 tablet (100 mg total) by mouth at bedtime. For high blood pressure   MULTIPLE VITAMINS-MINERALS (MULTIVITAMIN WITH MINERALS) TABLET    Take 1 tablet by mouth daily.   POTASSIUM CHLORIDE SA (K-DUR,KLOR-CON) 20 MEQ TABLET    Take 1 tablet (20 mEq total) by mouth daily.   PREDNISONE (DELTASONE) 50 MG TABLET    Take 1 tablet (50 mg  total) by mouth daily.   SILDENAFIL (VIAGRA) 50 MG TABLET    Take 1 tablet (50 mg total) by mouth daily as needed for erectile dysfunction. At least 24 hours between doses   VALACYCLOVIR (VALTREX) 500 MG TABLET    Take 1 tablet (500 mg total) by mouth daily. For Herpes.   VENTOLIN HFA 108 (90 BASE) MCG/ACT INHALER    INHALE 2  PUFFS INTO THE LUNGS EVERY 6 HOURS AS NEEDED FOR WHEEZING OR SHORTNESS OF BREATH  Modified Medications   No medications on file  Discontinued Medications   No medications on file    Allergies: No Known Allergies  Past Medical History: Past Medical History:  Diagnosis Date  . Active smoker   . AICD (automatic cardioverter/defibrillator) present   . Alcohol abuse   . Anxiety   . AR (allergic rhinitis)   . Bipolar 1 disorder (HCC)   . CHF (congestive heart failure) (HCC)   . Chronic bronchitis (HCC)   . Chronic systolic heart failure (HCC)   . COPD (chronic obstructive pulmonary disease) (HCC)   . Crack cocaine use   . Depression   . Genital herpes   . HIV (human immunodeficiency virus infection) (HCC) dx'd 08/1993  . HLD (hyperlipidemia)   . Hypertension   . NICM (nonischemic cardiomyopathy) (HCC)    Echocardiogram 06/28/11: EF 30-35%, mild MR, mild LAE;  No CAD by coronary CT angiogram 3/12 at [facility]  . NSVT (nonsustained ventricular tachycardia) (HCC)   . PTSD (post-traumatic stress disorder)     Social History: Social History   Socioeconomic History  . Marital status: Single    Spouse name: Not on file  . Number of children: Not on file  . Years of education: Not on file  . Highest education level: Not on file  Occupational History  . Not on file  Social Needs  . Financial resource strain: Not on file  . Food insecurity:    Worry: Not on file    Inability: Not on file  . Transportation needs:    Medical: Not on file    Non-medical: Not on file  Tobacco Use  . Smoking status: Current Every Day Smoker    Packs/day: 0.50    Years: 38.00    Pack years: 19.00    Types: Cigarettes  . Smokeless tobacco: Never Used  Substance and Sexual Activity  . Alcohol use: Yes    Alcohol/week: 3.6 oz    Types: 6 Cans of beer per week    Comment: 01/08/2018 "stopped 06/2017"  . Drug use: Yes    Types: Cocaine    Comment: 01/08/2018 "stopped 06/2017"  .  Sexual activity: Yes    Birth control/protection: Condom    Comment: pt. given condoms  Lifestyle  . Physical activity:    Days per week: Not on file    Minutes per session: Not on file  . Stress: Not on file  Relationships  . Social connections:    Talks on phone: Not on file    Gets together: Not on file    Attends religious service: Not on file    Active member of club or organization: Not on file    Attends meetings of clubs or organizations: Not on file    Relationship status: Not on file  Other Topics Concern  . Not on file  Social History Narrative  . Not on file    Labs: HIV 1 RNA Quant (copies/mL)  Date Value  08/28/2017 <20  NOT DETECTED  04/18/2016 71 (H)  10/13/2015 <20   CD4 T Cell Abs (/uL)  Date Value  08/28/2017 810  04/18/2016 710  10/13/2015 590   Hep B S Ab (no units)  Date Value  02/08/2009 INDETER (A)   Hepatitis B Surface Ag (no units)  Date Value  02/08/2009 NEG   HCV Ab (no units)  Date Value  02/08/2009 NEG    Lipids:    Component Value Date/Time   CHOL 150 04/18/2016 1622   TRIG 193 (H) 04/18/2016 1622   HDL 61 04/18/2016 1622   CHOLHDL 2.5 04/18/2016 1622   VLDL 39 (H) 04/18/2016 1622   LDLCALC 50 04/18/2016 1622    Current HIV Regimen: Biktarvy  Assessment: I was linked to [facility] by one of our RNs, [place]. Patient is a long standing patient of Dr. Zenaida Niece Dams. He is also homeless.  He was at the heart failure clinic today and we were called by the case manager/social worker Annice Pih) at the heart failure clinic to try and help get his Biktarvy.  He is apparently going into some kind of program tomorrow and needs a refill.  He has been out of Biktarvy x 1 week.  He has medicare/medicaid and we are able to fill at [facility]  I talked to Annice Pih on the phone - the heart failure clinic is helping him with his co-pays for his cardiac medications. Abdihakim came here and Kathie Rhodes was able to get him assistance for his Biktarvy through PAF.   I gave him 2 bus passes and he was able to go over to [facility] and pick it up today (verified he did pick it up earlier this afternoon). Annice Pih gave [place] her cell phone number to contact in the future for help with refills.   Plan: - [place] at [facility] - F/u with Dr. Daiva Eves in September  Cassie L. Kuppelweiser, PharmD, AAHIVP, CPP Infectious Diseases Clinical Pharmacist [facility] 03/26/2018, 3:30 PM

## 2018-03-26 NOTE — Progress Notes (Signed)
Patient arrived in clinic requesting to see CSW. Patient states he is going to a "30 day program tomorrow and need to bring all my medications for the month". Patient requesting assistance with obtaining meds as he reports he has no money. Patient's HF mediations  Called into to outpatient pharmacy and CSW assisted with co-pays. CSW and Clinic RN called to ID clinic for assistance with other needed medications. Patient had not been seen and was directed to ID clinic for further assistance in obtaining mediations. CSW provided patient with needed bus passes and directed patient to ID clinic for further follow up. No further needs at this time. Lasandra Beech, LCSW, CCSW-MCS [phone]

## 2018-03-27 ENCOUNTER — Encounter: Payer: Self-pay | Admitting: Cardiology

## 2018-03-29 ENCOUNTER — Encounter: Payer: Medicare Other | Admitting: Cardiology

## 2018-03-29 DIAGNOSIS — F333 Major depressive disorder, recurrent, severe with psychotic symptoms: Secondary | ICD-10-CM | POA: Diagnosis not present

## 2018-03-29 DIAGNOSIS — I5023 Acute on chronic systolic (congestive) heart failure: Secondary | ICD-10-CM | POA: Diagnosis not present

## 2018-03-29 DIAGNOSIS — B20 Human immunodeficiency virus [HIV] disease: Secondary | ICD-10-CM | POA: Diagnosis not present

## 2018-03-29 NOTE — Progress Notes (Deleted)
Electrophysiology Office Note   Date:  03/29/2018   ID:  Ryan Long, DOB [birth year], MRN [id number]  PCP:  Hoy Register, MD  Cardiologist:  Bensimhon Primary Electrophysiologist:  Sotero Brinkmeyer [name], MD    No chief complaint on file.    History of Present Illness: Ryan Long is a 57 y.o. male who is being seen today for the evaluation of CHF at the request of Ryan Long. Presenting today for electrophysiology evaluation. He has a history of tobacco abuse, alcohol abuse, bipolar 1, nonischemic cardiomyopathy, COPD, HIV, hyperlipidemia, hypertension.  His nonischemic cardiomyopathy was not felt due to HIV due to good CD4 counts.  He is no longer using cocaine.  He is try to cut back on smoking.  He is currently living in a shelter in [place].  Today, denies symptoms of palpitations, chest pain, shortness of breath, orthopnea, PND, lower extremity edema, claudication, dizziness, presyncope, syncope, bleeding, or neurologic sequela. The patient is tolerating medications without difficulties. ***   Past Medical History:  Diagnosis Date  . Active smoker   . AICD (automatic cardioverter/defibrillator) present   . Alcohol abuse   . Anxiety   . AR (allergic rhinitis)   . Bipolar 1 disorder (HCC)   . CHF (congestive heart failure) (HCC)   . Chronic bronchitis (HCC)   . Chronic systolic heart failure (HCC)   . COPD (chronic obstructive pulmonary disease) (HCC)   . Crack cocaine use   . Depression   . Genital herpes   . HIV (human immunodeficiency virus infection) (HCC) dx'd 08/1993  . HLD (hyperlipidemia)   . Hypertension   . NICM (nonischemic cardiomyopathy) (HCC)    Echocardiogram 06/28/11: EF 30-35%, mild MR, mild LAE;  No CAD by coronary CT angiogram 3/12 at [facility]  . NSVT (nonsustained ventricular tachycardia) (HCC)   . PTSD (post-traumatic stress disorder)    Past Surgical History:  Procedure Laterality Date  . CARDIAC  DEFIBRILLATOR PLACEMENT  01/08/2018  . ICD IMPLANT N/A 01/08/2018   Procedure: ICD IMPLANT;  Surgeon: Regan Lemming, MD;  Location: [facility] INVASIVE CV LAB;  Service: Cardiovascular;  Laterality: N/A;     Current Outpatient Medications  Medication Sig Dispense Refill  . aspirin 81 MG chewable tablet Chew 1 tablet (81 mg total) by mouth every morning. For heart health 30 tablet 11  . bictegravir-emtricitabine-tenofovir AF (BIKTARVY) 50-200-25 MG TABS tablet Take 1 tablet by mouth daily. 30 tablet 11  . digoxin (DIGOX) 0.125 MG tablet TAKE 1 TABLET BY MOUTH DAILY( FOR CONTROL OF HEART ARRYTHMIA) 30 tablet 3  . eplerenone (INSPRA) 25 MG tablet Take 0.5 tablets (12.5 mg total) by mouth daily. 15 tablet 11  . furosemide (LASIX) 40 MG tablet Take 1 tablet (40 mg total) by mouth daily. 90 tablet 1  . hydrOXYzine (ATARAX/VISTARIL) 25 MG tablet Take 1-2 tablets (25-50 mg total) by mouth 2 (two) times daily as needed for anxiety. 60 tablet 11  . losartan (COZAAR) 50 MG tablet TAKE 1 TABLET(50 MG) BY MOUTH DAILY 90 tablet 1  . metoprolol succinate (TOPROL-XL) 100 MG 24 hr tablet Take 1 tablet (100 mg total) by mouth at bedtime. For high blood pressure 30 tablet 11  . Multiple Vitamins-Minerals (MULTIVITAMIN WITH MINERALS) tablet Take 1 tablet by mouth daily.    . potassium chloride SA (K-DUR,KLOR-CON) 20 MEQ tablet Take 1 tablet (20 mEq total) by mouth daily. 90 tablet 1  . predniSONE (DELTASONE) 50 MG tablet Take 1 tablet (50 mg  total) by mouth daily. 5 tablet 0  . sildenafil (VIAGRA) 50 MG tablet Take 1 tablet (50 mg total) by mouth daily as needed for erectile dysfunction. At least 24 hours between doses 20 tablet 0  . valACYclovir (VALTREX) 500 MG tablet Take 1 tablet (500 mg total) by mouth daily. For Herpes. 30 tablet 1  . VENTOLIN HFA 108 (90 Base) MCG/ACT inhaler INHALE 2 PUFFS INTO THE LUNGS EVERY 6 HOURS AS NEEDED FOR WHEEZING OR SHORTNESS OF BREATH 18 g 1   No current facility-administered  medications for this visit.     Allergies:   Patient has no known allergies.   Social History:  The patient  reports that he has been smoking cigarettes.  He has a 19.00 pack-year smoking history. He has never used smokeless tobacco. He reports that he drinks about 3.6 oz of alcohol per week. He reports that he has current or past drug history. Drug: Cocaine.   Family History:  The patient's family history includes CAD in his father; Hypertension in his father.    ROS:  Please see the history of present illness.   Otherwise, review of systems is positive for ***.   All other systems are reviewed and negative.   PHYSICAL EXAM: VS:  There were no vitals taken for this visit. , BMI There is no height or weight on file to calculate BMI. GEN: Well nourished, well developed, in no acute distress  HEENT: normal  Neck: no JVD, carotid bruits, or masses Cardiac: ***RRR; no murmurs, rubs, or gallops,no edema  Respiratory:  clear to auscultation bilaterally, normal work of breathing GI: soft, nontender, nondistended, + BS MS: no deformity or atrophy  Skin: warm and dry, ***device site well healed Neuro:  Strength and sensation are intact Psych: euthymic mood, full affect  EKG:  EKG {ACTION; IS/IS [id number]} ordered today. Personal review of the ekg ordered *** shows ***  ***Personal review of the device interrogation today. Results in Paceart   Recent Labs: 02/25/2018: ALT 26 03/19/2018: BUN 12; Creatinine, Ser 1.18; Hemoglobin 17.4; Platelets 166; Potassium 3.9; Sodium 139    Lipid Panel     Component Value Date/Time   CHOL 150 04/18/2016 1622   TRIG 193 (H) 04/18/2016 1622   HDL 61 04/18/2016 1622   CHOLHDL 2.5 04/18/2016 1622   VLDL 39 (H) 04/18/2016 1622   LDLCALC 50 04/18/2016 1622     Wt Readings from Last 3 Encounters:  03/19/18 200 lb (90.7 kg)  02/25/18 207 lb 12.8 oz (94.3 kg)  02/01/18 198 lb 9.6 oz (90.1 kg)      Other studies Reviewed: Additional studies/  records that were reviewed today include: TTE 10/10/17  Review of the above records today demonstrates:  - Left ventricle: The cavity size was normal. Wall thickness was   normal. Systolic function was severely reduced. The estimated   ejection fraction was in the range of 25% to 30%. Diffuse   hypokinesis. Doppler parameters are consistent with abnormal left   ventricular relaxation (grade 1 diastolic dysfunction).   ASSESSMENT AND PLAN:  1.  Chronic systolic heart failure due to nonischemic cardiomyopathy: On optimal medical therapy.  Saint Jude ICD implanted 01/08/18.***  2.  HIV: Follow-up with Dr. Algis Liming.  No changes.***  3.  Tobacco abuse: Encouraged complete cessation***  4.  Cocaine abuse: Congratulated on continued abstinence.***  Current medicines are reviewed at length with the patient today.   The patient does not have concerns regarding his medicines.  The following  changes were made today:  ***  Labs/ tests ordered today include:  No orders of the defined types were placed in this encounter.    Disposition:   FU with Lash Matulich *** months  Signed, Letita Prentiss [name], MD  03/29/2018 7:47 AM     [facility] [address] [place] [address] [phone] (office) [phone] (fax)

## 2018-04-01 DIAGNOSIS — F333 Major depressive disorder, recurrent, severe with psychotic symptoms: Secondary | ICD-10-CM | POA: Diagnosis not present

## 2018-04-02 DIAGNOSIS — I5022 Chronic systolic (congestive) heart failure: Secondary | ICD-10-CM | POA: Diagnosis not present

## 2018-04-02 DIAGNOSIS — F333 Major depressive disorder, recurrent, severe with psychotic symptoms: Secondary | ICD-10-CM | POA: Diagnosis not present

## 2018-04-02 DIAGNOSIS — M545 Low back pain: Secondary | ICD-10-CM | POA: Diagnosis not present

## 2018-04-02 DIAGNOSIS — R0602 Shortness of breath: Secondary | ICD-10-CM | POA: Diagnosis not present

## 2018-04-03 DIAGNOSIS — F333 Major depressive disorder, recurrent, severe with psychotic symptoms: Secondary | ICD-10-CM | POA: Diagnosis not present

## 2018-04-04 DIAGNOSIS — F333 Major depressive disorder, recurrent, severe with psychotic symptoms: Secondary | ICD-10-CM | POA: Diagnosis not present

## 2018-04-05 DIAGNOSIS — F333 Major depressive disorder, recurrent, severe with psychotic symptoms: Secondary | ICD-10-CM | POA: Diagnosis not present

## 2018-04-07 DIAGNOSIS — M62830 Muscle spasm of back: Secondary | ICD-10-CM | POA: Diagnosis not present

## 2018-04-07 DIAGNOSIS — L259 Unspecified contact dermatitis, unspecified cause: Secondary | ICD-10-CM | POA: Diagnosis not present

## 2018-04-08 DIAGNOSIS — F333 Major depressive disorder, recurrent, severe with psychotic symptoms: Secondary | ICD-10-CM | POA: Diagnosis not present

## 2018-04-09 DIAGNOSIS — F333 Major depressive disorder, recurrent, severe with psychotic symptoms: Secondary | ICD-10-CM | POA: Diagnosis not present

## 2018-04-10 ENCOUNTER — Encounter: Payer: Medicare Other | Admitting: Cardiology

## 2018-04-14 DIAGNOSIS — R3915 Urgency of urination: Secondary | ICD-10-CM | POA: Diagnosis not present

## 2018-04-14 DIAGNOSIS — Z5181 Encounter for therapeutic drug level monitoring: Secondary | ICD-10-CM | POA: Diagnosis not present

## 2018-04-14 DIAGNOSIS — N50812 Left testicular pain: Secondary | ICD-10-CM | POA: Diagnosis not present

## 2018-04-14 DIAGNOSIS — N50811 Right testicular pain: Secondary | ICD-10-CM | POA: Diagnosis not present

## 2018-04-14 DIAGNOSIS — Z21 Asymptomatic human immunodeficiency virus [HIV] infection status: Secondary | ICD-10-CM | POA: Diagnosis not present

## 2018-04-14 DIAGNOSIS — Z79899 Other long term (current) drug therapy: Secondary | ICD-10-CM | POA: Diagnosis not present

## 2018-04-14 DIAGNOSIS — N451 Epididymitis: Secondary | ICD-10-CM | POA: Diagnosis not present

## 2018-04-14 DIAGNOSIS — N5082 Scrotal pain: Secondary | ICD-10-CM | POA: Diagnosis not present

## 2018-04-14 DIAGNOSIS — N433 Hydrocele, unspecified: Secondary | ICD-10-CM | POA: Diagnosis not present

## 2018-04-14 DIAGNOSIS — M545 Low back pain: Secondary | ICD-10-CM | POA: Diagnosis not present

## 2018-04-14 DIAGNOSIS — F17210 Nicotine dependence, cigarettes, uncomplicated: Secondary | ICD-10-CM | POA: Diagnosis not present

## 2018-04-14 DIAGNOSIS — R109 Unspecified abdominal pain: Secondary | ICD-10-CM | POA: Diagnosis not present

## 2018-04-22 ENCOUNTER — Encounter (HOSPITAL_COMMUNITY): Payer: Medicare Other

## 2018-04-24 ENCOUNTER — Telehealth: Payer: Self-pay | Admitting: Pharmacy Technician

## 2018-04-24 NOTE — Telephone Encounter (Signed)
Ryan Long moved to [place] and requested his meds be transferred to a different specialty pharmacy.

## 2018-04-26 DIAGNOSIS — S39012A Strain of muscle, fascia and tendon of lower back, initial encounter: Secondary | ICD-10-CM | POA: Diagnosis not present

## 2018-04-26 DIAGNOSIS — Z125 Encounter for screening for malignant neoplasm of prostate: Secondary | ICD-10-CM | POA: Diagnosis not present

## 2018-04-26 DIAGNOSIS — N451 Epididymitis: Secondary | ICD-10-CM | POA: Diagnosis not present

## 2018-04-26 DIAGNOSIS — B20 Human immunodeficiency virus [HIV] disease: Secondary | ICD-10-CM | POA: Diagnosis not present

## 2018-04-26 DIAGNOSIS — I509 Heart failure, unspecified: Secondary | ICD-10-CM | POA: Diagnosis not present

## 2018-04-26 DIAGNOSIS — Z1211 Encounter for screening for malignant neoplasm of colon: Secondary | ICD-10-CM | POA: Diagnosis not present

## 2018-04-26 DIAGNOSIS — I4891 Unspecified atrial fibrillation: Secondary | ICD-10-CM | POA: Diagnosis not present

## 2018-04-26 DIAGNOSIS — I2510 Atherosclerotic heart disease of native coronary artery without angina pectoris: Secondary | ICD-10-CM | POA: Diagnosis not present

## 2018-04-26 DIAGNOSIS — I10 Essential (primary) hypertension: Secondary | ICD-10-CM | POA: Diagnosis not present

## 2018-04-26 DIAGNOSIS — N50811 Right testicular pain: Secondary | ICD-10-CM | POA: Diagnosis not present

## 2018-05-02 DIAGNOSIS — Z6831 Body mass index (BMI) 31.0-31.9, adult: Secondary | ICD-10-CM | POA: Diagnosis not present

## 2018-05-02 DIAGNOSIS — B20 Human immunodeficiency virus [HIV] disease: Secondary | ICD-10-CM | POA: Diagnosis not present

## 2018-05-03 DIAGNOSIS — N451 Epididymitis: Secondary | ICD-10-CM | POA: Diagnosis not present

## 2018-05-03 DIAGNOSIS — N50811 Right testicular pain: Secondary | ICD-10-CM | POA: Diagnosis not present

## 2018-05-03 DIAGNOSIS — Z6831 Body mass index (BMI) 31.0-31.9, adult: Secondary | ICD-10-CM | POA: Diagnosis not present

## 2018-05-23 DIAGNOSIS — F29 Unspecified psychosis not due to a substance or known physiological condition: Secondary | ICD-10-CM | POA: Diagnosis not present

## 2018-05-24 DIAGNOSIS — F321 Major depressive disorder, single episode, moderate: Secondary | ICD-10-CM | POA: Insufficient documentation

## 2018-06-06 DIAGNOSIS — F331 Major depressive disorder, recurrent, moderate: Secondary | ICD-10-CM | POA: Insufficient documentation

## 2018-06-11 DIAGNOSIS — Z6831 Body mass index (BMI) 31.0-31.9, adult: Secondary | ICD-10-CM | POA: Diagnosis not present

## 2018-06-11 DIAGNOSIS — B20 Human immunodeficiency virus [HIV] disease: Secondary | ICD-10-CM | POA: Diagnosis not present

## 2018-06-11 DIAGNOSIS — I509 Heart failure, unspecified: Secondary | ICD-10-CM | POA: Diagnosis not present

## 2018-06-11 DIAGNOSIS — I10 Essential (primary) hypertension: Secondary | ICD-10-CM | POA: Diagnosis not present

## 2018-06-11 DIAGNOSIS — I4891 Unspecified atrial fibrillation: Secondary | ICD-10-CM | POA: Diagnosis not present

## 2018-06-14 DIAGNOSIS — I10 Essential (primary) hypertension: Secondary | ICD-10-CM | POA: Diagnosis not present

## 2018-06-14 DIAGNOSIS — I509 Heart failure, unspecified: Secondary | ICD-10-CM | POA: Diagnosis not present

## 2018-06-14 DIAGNOSIS — I2510 Atherosclerotic heart disease of native coronary artery without angina pectoris: Secondary | ICD-10-CM | POA: Diagnosis not present

## 2018-06-14 DIAGNOSIS — Z6831 Body mass index (BMI) 31.0-31.9, adult: Secondary | ICD-10-CM | POA: Diagnosis not present

## 2018-06-14 DIAGNOSIS — I4891 Unspecified atrial fibrillation: Secondary | ICD-10-CM | POA: Diagnosis not present

## 2018-06-24 DIAGNOSIS — F05 Delirium due to known physiological condition: Secondary | ICD-10-CM | POA: Diagnosis not present

## 2018-06-24 DIAGNOSIS — F323 Major depressive disorder, single episode, severe with psychotic features: Secondary | ICD-10-CM | POA: Insufficient documentation

## 2018-06-25 DIAGNOSIS — F1410 Cocaine abuse, uncomplicated: Secondary | ICD-10-CM | POA: Insufficient documentation

## 2018-06-25 DIAGNOSIS — Z008 Encounter for other general examination: Secondary | ICD-10-CM | POA: Insufficient documentation

## 2018-06-25 DIAGNOSIS — B20 Human immunodeficiency virus [HIV] disease: Secondary | ICD-10-CM | POA: Diagnosis not present

## 2018-06-25 DIAGNOSIS — I110 Hypertensive heart disease with heart failure: Secondary | ICD-10-CM | POA: Diagnosis not present

## 2018-06-25 DIAGNOSIS — Z022 Encounter for examination for admission to residential institution: Secondary | ICD-10-CM | POA: Diagnosis not present

## 2018-06-25 DIAGNOSIS — I509 Heart failure, unspecified: Secondary | ICD-10-CM | POA: Diagnosis not present

## 2018-06-28 DIAGNOSIS — R9431 Abnormal electrocardiogram [ECG] [EKG]: Secondary | ICD-10-CM | POA: Insufficient documentation

## 2018-06-29 DIAGNOSIS — I499 Cardiac arrhythmia, unspecified: Secondary | ICD-10-CM | POA: Diagnosis not present

## 2018-07-01 DIAGNOSIS — B20 Human immunodeficiency virus [HIV] disease: Secondary | ICD-10-CM | POA: Diagnosis not present

## 2018-07-01 DIAGNOSIS — Z950 Presence of cardiac pacemaker: Secondary | ICD-10-CM | POA: Diagnosis not present

## 2018-07-01 DIAGNOSIS — Z720 Tobacco use: Secondary | ICD-10-CM | POA: Diagnosis not present

## 2018-07-01 DIAGNOSIS — I110 Hypertensive heart disease with heart failure: Secondary | ICD-10-CM | POA: Diagnosis not present

## 2018-07-01 DIAGNOSIS — I509 Heart failure, unspecified: Secondary | ICD-10-CM | POA: Diagnosis not present

## 2018-07-01 DIAGNOSIS — F323 Major depressive disorder, single episode, severe with psychotic features: Secondary | ICD-10-CM | POA: Diagnosis not present

## 2018-07-02 DIAGNOSIS — B20 Human immunodeficiency virus [HIV] disease: Secondary | ICD-10-CM | POA: Diagnosis not present

## 2018-07-02 DIAGNOSIS — Z950 Presence of cardiac pacemaker: Secondary | ICD-10-CM | POA: Diagnosis not present

## 2018-07-02 DIAGNOSIS — F323 Major depressive disorder, single episode, severe with psychotic features: Secondary | ICD-10-CM | POA: Diagnosis not present

## 2018-07-02 DIAGNOSIS — Z720 Tobacco use: Secondary | ICD-10-CM | POA: Diagnosis not present

## 2018-07-02 DIAGNOSIS — I509 Heart failure, unspecified: Secondary | ICD-10-CM | POA: Diagnosis not present

## 2018-07-02 DIAGNOSIS — I110 Hypertensive heart disease with heart failure: Secondary | ICD-10-CM | POA: Diagnosis not present

## 2018-07-11 ENCOUNTER — Telehealth: Payer: Self-pay | Admitting: Cardiology

## 2018-07-11 NOTE — Telephone Encounter (Signed)
Spoke w/ pt and he confirmed that he did not want his remote monitoring to be release to [employer].

## 2018-07-11 NOTE — Telephone Encounter (Signed)
LMVOVM for pt to return call in regards to request in remote monitoring web site to release his information.

## 2018-07-22 ENCOUNTER — Encounter (HOSPITAL_COMMUNITY): Payer: Medicare Other

## 2018-07-26 ENCOUNTER — Other Ambulatory Visit (HOSPITAL_COMMUNITY): Payer: Self-pay

## 2018-07-26 DIAGNOSIS — I5022 Chronic systolic (congestive) heart failure: Secondary | ICD-10-CM

## 2018-07-26 MED ORDER — DIGOXIN 125 MCG PO TABS: ORAL_TABLET | ORAL | 0 refills | Status: DC

## 2018-07-29 ENCOUNTER — Other Ambulatory Visit (HOSPITAL_COMMUNITY): Payer: Self-pay | Admitting: Internal Medicine

## 2018-07-29 ENCOUNTER — Ambulatory Visit: Payer: Self-pay | Admitting: Family

## 2018-07-29 DIAGNOSIS — I5022 Chronic systolic (congestive) heart failure: Secondary | ICD-10-CM

## 2018-08-06 ENCOUNTER — Encounter: Payer: Self-pay | Admitting: Cardiology

## 2018-08-06 NOTE — Progress Notes (Deleted)
Electrophysiology Office Note   Date:  08/06/2018   ID:  Ryan Long, DOB 1961/07/02, MRN [id number]  PCP:  Hoy Register, MD  Cardiologist:  Bensimhon Primary Electrophysiologist:  Will [name], MD    No chief complaint on file.    History of Present Illness: Ryan Long is a 57 y.o. male who is being seen today for the evaluation of CHF at the request of [name]. Presenting today for electrophysiology evaluation. He has a history of tobacco abuse, alcohol abuse, bipolar 1, nonischemic cardiomyopathy, COPD, HIV, hyperlipidemia, hypertension.  His nonischemic cardiomyopathy was not felt due to HIV due to good CD4 counts.  He is no longer using cocaine.  He is try to cut back on smoking.  He is currently living in a shelter in [place].  A Saint Jude ICD implanted 01/08/2018.   Today, denies symptoms of palpitations, chest pain, shortness of breath, orthopnea, PND, lower extremity edema, claudication, dizziness, presyncope, syncope, bleeding, or neurologic sequela. The patient is tolerating medications without difficulties. ***   Past Medical History:  Diagnosis Date  . Active smoker   . AICD (automatic cardioverter/defibrillator) present   . Alcohol abuse   . Anxiety   . AR (allergic rhinitis)   . Bipolar 1 disorder (HCC)   . CHF (congestive heart failure) (HCC)   . Chronic bronchitis (HCC)   . Chronic systolic heart failure (HCC)   . COPD (chronic obstructive pulmonary disease) (HCC)   . Crack cocaine use   . Depression   . Genital herpes   . HIV (human immunodeficiency virus infection) (HCC) dx'd 08/1993  . HLD (hyperlipidemia)   . Hypertension   . NICM (nonischemic cardiomyopathy) (HCC)    Echocardiogram 06/28/11: EF 30-35%, mild MR, mild LAE;  No CAD by coronary CT angiogram 3/12 at [facility]  . NSVT (nonsustained ventricular tachycardia) (HCC)   . PTSD (post-traumatic stress disorder)    Past Surgical History:    Procedure Laterality Date  . CARDIAC DEFIBRILLATOR PLACEMENT  01/08/2018  . ICD IMPLANT N/A 01/08/2018   Procedure: ICD IMPLANT;  Surgeon: Regan Lemming, MD;  Location: [facility] INVASIVE CV LAB;  Service: Cardiovascular;  Laterality: N/A;     Current Outpatient Medications  Medication Sig Dispense Refill  . aspirin 81 MG chewable tablet Chew 1 tablet (81 mg total) by mouth every morning. For heart health 30 tablet 11  . bictegravir-emtricitabine-tenofovir AF (BIKTARVY) 50-200-25 MG TABS tablet Take 1 tablet by mouth daily. 30 tablet 11  . DIGOX 125 MCG tablet TAKE 1 TABLET BY MOUTH DAILY FOR CONTROL OF HEART ARRHYTHMIA 30 tablet 0  . digoxin (DIGOX) 0.125 MG tablet TAKE 1 TABLET BY MOUTH DAILY( FOR CONTROL OF HEART ARRYTHMIA) 90 tablet 0  . eplerenone (INSPRA) 25 MG tablet Take 0.5 tablets (12.5 mg total) by mouth daily. 15 tablet 11  . furosemide (LASIX) 40 MG tablet Take 1 tablet (40 mg total) by mouth daily. 90 tablet 1  . hydrOXYzine (ATARAX/VISTARIL) 25 MG tablet Take 1-2 tablets (25-50 mg total) by mouth 2 (two) times daily as needed for anxiety. 60 tablet 11  . losartan (COZAAR) 50 MG tablet TAKE 1 TABLET(50 MG) BY MOUTH DAILY 90 tablet 1  . metoprolol succinate (TOPROL-XL) 100 MG 24 hr tablet Take 1 tablet (100 mg total) by mouth at bedtime. For high blood pressure 30 tablet 11  . Multiple Vitamins-Minerals (MULTIVITAMIN WITH MINERALS) tablet Take 1 tablet by mouth daily.    . potassium chloride  SA (K-DUR,KLOR-CON) 20 MEQ tablet Take 1 tablet (20 mEq total) by mouth daily. 90 tablet 1  . predniSONE (DELTASONE) 50 MG tablet Take 1 tablet (50 mg total) by mouth daily. 5 tablet 0  . sildenafil (VIAGRA) 50 MG tablet Take 1 tablet (50 mg total) by mouth daily as needed for erectile dysfunction. At least 24 hours between doses 20 tablet 0  . valACYclovir (VALTREX) 500 MG tablet Take 1 tablet (500 mg total) by mouth daily. For Herpes. 30 tablet 1  . VENTOLIN HFA 108 (90 Base) MCG/ACT  inhaler INHALE 2 PUFFS INTO THE LUNGS EVERY 6 HOURS AS NEEDED FOR WHEEZING OR SHORTNESS OF BREATH 18 g 1   No current facility-administered medications for this visit.     Allergies:   Patient has no known allergies.   Social History:  The patient  reports that he has been smoking cigarettes. He has a 19.00 pack-year smoking history. He has never used smokeless tobacco. He reports that he drinks about 6.0 standard drinks of alcohol per week. He reports that he has current or past drug history. Drug: Cocaine.   Family History:  The patient's family history includes CAD in his father; Hypertension in his father.   ROS:  Please see the history of present illness.   Otherwise, review of systems is positive for ***.   All other systems are reviewed and negative.   PHYSICAL EXAM: VS:  There were no vitals taken for this visit. , BMI There is no height or weight on file to calculate BMI. GEN: Well nourished, well developed, in no acute distress  HEENT: normal  Neck: no JVD, carotid bruits, or masses Cardiac: ***RRR; no murmurs, rubs, or gallops,no edema  Respiratory:  clear to auscultation bilaterally, normal work of breathing GI: soft, nontender, nondistended, + BS MS: no deformity or atrophy  Skin: warm and dry, ***device site well healed Neuro:  Strength and sensation are intact Psych: euthymic mood, full affect  EKG:  EKG {ACTION; IS/IS [id number]} ordered today. Personal review of the ekg ordered *** shows ***  ***Personal review of the device interrogation today. Results in Paceart   Recent Labs: 02/25/2018: ALT 26 03/19/2018: BUN 12; Creatinine, Ser 1.18; Hemoglobin 17.4; Platelets 166; Potassium 3.9; Sodium 139    Lipid Panel     Component Value Date/Time   CHOL 150 04/18/2016 1622   TRIG 193 (H) 04/18/2016 1622   HDL 61 04/18/2016 1622   CHOLHDL 2.5 04/18/2016 1622   VLDL 39 (H) 04/18/2016 1622   LDLCALC 50 04/18/2016 1622     Wt Readings from Last 3 Encounters:   03/19/18 200 lb (90.7 kg)  02/25/18 207 lb 12.8 oz (94.3 kg)  02/01/18 198 lb 9.6 oz (90.1 kg)      Other studies Reviewed: Additional studies/ records that were reviewed today include: TTE 10/10/17  Review of the above records today demonstrates:  - Left ventricle: The cavity size was normal. Wall thickness was   normal. Systolic function was severely reduced. The estimated   ejection fraction was in the range of 25% to 30%. Diffuse   hypokinesis. Doppler parameters are consistent with abnormal left   ventricular relaxation (grade 1 diastolic dysfunction).   ASSESSMENT AND PLAN:  1.  Chronic systolic heart failure due to nonischemic cardiomyopathy: Currently on optimal medical therapy.  Saint Jude ICD implanted 01/08/2018.***  2.  HIV: Follow-up with Dr. Algis Liming.  No changes.***  3.  Tobacco abuse: Encouraged complete cessation***  4.  Cocaine abuse:  Congratulated on continued abstinence.***  Current medicines are reviewed at length with the patient today.   The patient does not have concerns regarding his medicines.  The following changes were made today:  ***  Labs/ tests ordered today include:  No orders of the defined types were placed in this encounter.    Disposition:   FU with Will Camnitz *** months  Signed, Will [name], MD  08/06/2018 8:31 AM     [facility] [address] [place] [address] [phone] (office) [phone] (fax)

## 2018-08-07 ENCOUNTER — Encounter: Payer: Self-pay | Admitting: Cardiology

## 2018-08-12 ENCOUNTER — Ambulatory Visit: Payer: Self-pay | Admitting: Family Medicine

## 2018-08-13 DIAGNOSIS — M79605 Pain in left leg: Secondary | ICD-10-CM | POA: Diagnosis not present

## 2018-08-13 DIAGNOSIS — F17210 Nicotine dependence, cigarettes, uncomplicated: Secondary | ICD-10-CM | POA: Diagnosis not present

## 2018-08-13 DIAGNOSIS — Z9181 History of falling: Secondary | ICD-10-CM | POA: Diagnosis not present

## 2018-08-13 DIAGNOSIS — M25552 Pain in left hip: Secondary | ICD-10-CM | POA: Diagnosis not present

## 2018-08-13 DIAGNOSIS — R1032 Left lower quadrant pain: Secondary | ICD-10-CM | POA: Diagnosis not present

## 2018-08-15 DIAGNOSIS — Z7982 Long term (current) use of aspirin: Secondary | ICD-10-CM | POA: Diagnosis not present

## 2018-08-15 DIAGNOSIS — M25552 Pain in left hip: Secondary | ICD-10-CM | POA: Diagnosis not present

## 2018-08-15 DIAGNOSIS — Z95810 Presence of automatic (implantable) cardiac defibrillator: Secondary | ICD-10-CM | POA: Insufficient documentation

## 2018-08-15 DIAGNOSIS — F17200 Nicotine dependence, unspecified, uncomplicated: Secondary | ICD-10-CM | POA: Diagnosis not present

## 2018-08-15 DIAGNOSIS — M810 Age-related osteoporosis without current pathological fracture: Secondary | ICD-10-CM | POA: Diagnosis not present

## 2018-08-15 DIAGNOSIS — Z79899 Other long term (current) drug therapy: Secondary | ICD-10-CM | POA: Diagnosis not present

## 2018-08-15 DIAGNOSIS — M1612 Unilateral primary osteoarthritis, left hip: Secondary | ICD-10-CM | POA: Diagnosis not present

## 2018-08-21 DIAGNOSIS — M25552 Pain in left hip: Secondary | ICD-10-CM | POA: Diagnosis not present

## 2018-08-21 DIAGNOSIS — M1612 Unilateral primary osteoarthritis, left hip: Secondary | ICD-10-CM | POA: Diagnosis not present

## 2018-08-26 ENCOUNTER — Other Ambulatory Visit (HOSPITAL_COMMUNITY): Payer: Self-pay | Admitting: Internal Medicine

## 2018-08-26 DIAGNOSIS — I5022 Chronic systolic (congestive) heart failure: Secondary | ICD-10-CM

## 2018-08-29 DIAGNOSIS — Z23 Encounter for immunization: Secondary | ICD-10-CM | POA: Diagnosis not present

## 2018-09-02 ENCOUNTER — Other Ambulatory Visit: Payer: Self-pay

## 2018-09-03 ENCOUNTER — Other Ambulatory Visit: Payer: Self-pay | Admitting: *Deleted

## 2018-09-03 ENCOUNTER — Other Ambulatory Visit: Payer: Self-pay

## 2018-09-03 DIAGNOSIS — Z113 Encounter for screening for infections with a predominantly sexual mode of transmission: Secondary | ICD-10-CM

## 2018-09-03 DIAGNOSIS — B20 Human immunodeficiency virus [HIV] disease: Secondary | ICD-10-CM

## 2018-09-06 DIAGNOSIS — E785 Hyperlipidemia, unspecified: Secondary | ICD-10-CM | POA: Diagnosis not present

## 2018-09-06 DIAGNOSIS — I10 Essential (primary) hypertension: Secondary | ICD-10-CM | POA: Diagnosis not present

## 2018-09-06 DIAGNOSIS — J449 Chronic obstructive pulmonary disease, unspecified: Secondary | ICD-10-CM | POA: Diagnosis not present

## 2018-09-06 DIAGNOSIS — F4310 Post-traumatic stress disorder, unspecified: Secondary | ICD-10-CM | POA: Diagnosis not present

## 2018-09-06 DIAGNOSIS — Z79899 Other long term (current) drug therapy: Secondary | ICD-10-CM | POA: Diagnosis not present

## 2018-09-06 DIAGNOSIS — I429 Cardiomyopathy, unspecified: Secondary | ICD-10-CM | POA: Diagnosis not present

## 2018-09-06 DIAGNOSIS — Z21 Asymptomatic human immunodeficiency virus [HIV] infection status: Secondary | ICD-10-CM | POA: Diagnosis not present

## 2018-09-06 DIAGNOSIS — F419 Anxiety disorder, unspecified: Secondary | ICD-10-CM | POA: Diagnosis not present

## 2018-09-06 DIAGNOSIS — R45851 Suicidal ideations: Secondary | ICD-10-CM | POA: Diagnosis not present

## 2018-09-06 DIAGNOSIS — Z7982 Long term (current) use of aspirin: Secondary | ICD-10-CM | POA: Diagnosis not present

## 2018-09-06 DIAGNOSIS — Z950 Presence of cardiac pacemaker: Secondary | ICD-10-CM | POA: Diagnosis not present

## 2018-09-06 DIAGNOSIS — F17210 Nicotine dependence, cigarettes, uncomplicated: Secondary | ICD-10-CM | POA: Diagnosis not present

## 2018-09-06 DIAGNOSIS — F319 Bipolar disorder, unspecified: Secondary | ICD-10-CM | POA: Diagnosis not present

## 2018-09-06 DIAGNOSIS — Z590 Homelessness: Secondary | ICD-10-CM | POA: Diagnosis not present

## 2018-09-07 DIAGNOSIS — R45851 Suicidal ideations: Secondary | ICD-10-CM | POA: Diagnosis not present

## 2018-09-07 DIAGNOSIS — F339 Major depressive disorder, recurrent, unspecified: Secondary | ICD-10-CM | POA: Diagnosis not present

## 2018-09-08 DIAGNOSIS — I10 Essential (primary) hypertension: Secondary | ICD-10-CM | POA: Diagnosis not present

## 2018-09-08 DIAGNOSIS — Z95810 Presence of automatic (implantable) cardiac defibrillator: Secondary | ICD-10-CM | POA: Diagnosis not present

## 2018-09-08 DIAGNOSIS — I429 Cardiomyopathy, unspecified: Secondary | ICD-10-CM | POA: Diagnosis not present

## 2018-09-08 DIAGNOSIS — F319 Bipolar disorder, unspecified: Secondary | ICD-10-CM | POA: Diagnosis not present

## 2018-09-09 DIAGNOSIS — F319 Bipolar disorder, unspecified: Secondary | ICD-10-CM | POA: Diagnosis not present

## 2018-09-09 DIAGNOSIS — Z950 Presence of cardiac pacemaker: Secondary | ICD-10-CM | POA: Diagnosis not present

## 2018-09-09 DIAGNOSIS — I429 Cardiomyopathy, unspecified: Secondary | ICD-10-CM | POA: Diagnosis not present

## 2018-09-09 DIAGNOSIS — Z7901 Long term (current) use of anticoagulants: Secondary | ICD-10-CM | POA: Diagnosis not present

## 2018-09-09 DIAGNOSIS — F332 Major depressive disorder, recurrent severe without psychotic features: Secondary | ICD-10-CM | POA: Diagnosis not present

## 2018-09-09 DIAGNOSIS — F4310 Post-traumatic stress disorder, unspecified: Secondary | ICD-10-CM | POA: Diagnosis not present

## 2018-09-09 DIAGNOSIS — F17210 Nicotine dependence, cigarettes, uncomplicated: Secondary | ICD-10-CM | POA: Diagnosis present

## 2018-09-09 DIAGNOSIS — Z590 Homelessness: Secondary | ICD-10-CM | POA: Diagnosis not present

## 2018-09-09 DIAGNOSIS — Z21 Asymptomatic human immunodeficiency virus [HIV] infection status: Secondary | ICD-10-CM | POA: Diagnosis present

## 2018-09-09 DIAGNOSIS — Z95810 Presence of automatic (implantable) cardiac defibrillator: Secondary | ICD-10-CM | POA: Diagnosis not present

## 2018-09-09 DIAGNOSIS — I10 Essential (primary) hypertension: Secondary | ICD-10-CM | POA: Diagnosis not present

## 2018-09-09 DIAGNOSIS — R45851 Suicidal ideations: Secondary | ICD-10-CM | POA: Diagnosis not present

## 2018-09-09 DIAGNOSIS — I110 Hypertensive heart disease with heart failure: Secondary | ICD-10-CM | POA: Diagnosis present

## 2018-09-09 DIAGNOSIS — I5022 Chronic systolic (congestive) heart failure: Secondary | ICD-10-CM | POA: Diagnosis present

## 2018-09-09 DIAGNOSIS — Z915 Personal history of self-harm: Secondary | ICD-10-CM | POA: Diagnosis not present

## 2018-09-09 DIAGNOSIS — N400 Enlarged prostate without lower urinary tract symptoms: Secondary | ICD-10-CM | POA: Diagnosis present

## 2018-09-16 ENCOUNTER — Ambulatory Visit: Payer: Self-pay | Admitting: Infectious Disease

## 2018-09-17 ENCOUNTER — Ambulatory Visit: Payer: Self-pay | Admitting: Infectious Disease

## 2018-10-06 ENCOUNTER — Other Ambulatory Visit (HOSPITAL_COMMUNITY): Payer: Self-pay | Admitting: Internal Medicine

## 2018-10-06 ENCOUNTER — Other Ambulatory Visit (HOSPITAL_COMMUNITY): Payer: Self-pay | Admitting: Adult Health

## 2018-10-06 DIAGNOSIS — I5022 Chronic systolic (congestive) heart failure: Secondary | ICD-10-CM

## 2018-10-11 DIAGNOSIS — R45851 Suicidal ideations: Secondary | ICD-10-CM

## 2018-10-11 DIAGNOSIS — Z95810 Presence of automatic (implantable) cardiac defibrillator: Secondary | ICD-10-CM | POA: Diagnosis not present

## 2018-10-11 DIAGNOSIS — F333 Major depressive disorder, recurrent, severe with psychotic symptoms: Secondary | ICD-10-CM | POA: Diagnosis not present

## 2018-10-11 DIAGNOSIS — Z21 Asymptomatic human immunodeficiency virus [HIV] infection status: Secondary | ICD-10-CM | POA: Diagnosis not present

## 2018-10-11 DIAGNOSIS — I110 Hypertensive heart disease with heart failure: Secondary | ICD-10-CM | POA: Diagnosis not present

## 2018-10-11 DIAGNOSIS — I509 Heart failure, unspecified: Secondary | ICD-10-CM | POA: Diagnosis not present

## 2018-10-11 NOTE — Progress Notes (Signed)
Page received. Discussed case with ER MD.  Pt needing voluntary psych placement. Referral for inpt voluntary placement placed with [facility].  Per intake no beds at this time.  Will follow up in morning if placement still needed.  If pt becomes involuntary at any point during night, please involve [facility].

## 2018-10-11 NOTE — ED Provider Notes (Signed)
EMERGENCY DEPARTMENT HISTORY AND PHYSICAL EXAM    Date: 10/11/2018  Patient Name: Jerry [name] [facility]    History of Presenting Illness     Chief Complaint   Patient presents with   ??? Suicidal         History Provided By: Patient    Additional History (Context):     10:11 PM    Jerry [name] is a 57 y.o. male with pertinent PMHx of HTN, HIV (diagnosed in '94: his viral load is currently undetectable and he has a high CD4 count; compliant with medications), heart failure, cardiac defibrillator (placed January 2019), anxiety, depression, and SI presenting ambulatory to the ED c/o SI x for the last few days. Pt states that he intended to jump out in front of a truck this afternoon. Pt also notes auditory hallucinations which are also new. He states that he hears his mother. Pt states that he grew up in the area and relocated to [place], [place] after a positive HIV diagnosis that estranged him from his family. Pt lived in [place] until he lost his apartment recently. He came back to [place] to find his family, but has been unsuccessful in his search. 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 He has been staying at [facility], but states that his time there "is up". He has been able to maintain compliance with his regular medications, despite all of the recent living situation changes. He notes a FHx of MI. He denies any personal h/o stroke. Pt smokes ~15 cigarettes/day and has been successful in decreasing his use from 3-4 ppd. He used to drink and do illicit drugs, but nothing in the last few years.    PCP: Other, Phys, MD    There are no other complaints, changes, or physical findings at this time.     Current Outpatient Medications   Medication Sig Dispense Refill   ??? digoxin (LANOXIN) 0.125 mg tablet Take  by mouth daily.     ??? metoprolol succinate (TOPROL XL) 100 mg tablet Take  by mouth daily.     ??? eplerenone (INSPRA) 25 mg tablet Take  by mouth daily.     ???  bictegrav-emtricit-tenofov ala (BIKTARVY) tab tablet Take 1 Tab by mouth daily.     ??? hydrOXYzine HCl (ATARAX) 25 mg tablet Take  by mouth three (3) times daily as needed for Itching.     ??? valACYclovir (VALTREX) 500 mg tablet Take  by mouth two (2) times a day.     ??? potassium chloride (K-DUR, KLOR-CON) 20 mEq tablet Take  by mouth two (2) times a day.     ??? tamsulosin (FLOMAX) 0.4 mg capsule Take 0.4 mg by mouth daily.     ??? losartan (COZAAR) 50 mg tablet Take  by mouth daily.     ??? sildenafil citrate (VIAGRA) 50 mg tablet Take 50 mg by mouth as needed.     ??? furosemide (LASIX) 40 mg tablet Take  by mouth daily.     ??? aspirin 81 mg chewable tablet Take 81 mg by mouth daily.         Past History     Past Medical History:  Past Medical History:   Diagnosis Date   ??? Anxiety    ??? Depression    ??? Heart failure ([place])    ??? HIV (human immunodeficiency virus infection) ([place])    ??? Hypertension    ??? Pacemaker    ??? Suicidal  ideation        Past Surgical History:  No past surgical history on file.    Family History:  No family history on file.    Social History:  Social History     Tobacco Use   ??? Smoking status: Not on file   Substance Use Topics   ??? Alcohol use: Not on file   ??? Drug use: Not on file       Allergies:  No Known Allergies      Review of Systems   Review of Systems   Constitutional: Negative.    Respiratory: Negative.    Cardiovascular: Negative.  Negative for leg swelling.   Genitourinary: Negative.    Psychiatric/Behavioral: Positive for hallucinations (auditory) and suicidal ideas.   All other systems reviewed and are negative.      Physical Exam     Vitals:    10/11/18 2122 10/12/18 0500   BP: (!) 131/103 108/63   Pulse: 81 71   Resp: 14 14   Temp: 97.6 ??F (36.4 ??C) 97.8 ??F (36.6 ??C)   SpO2: 100% 100%   Weight: 93.9 kg (207 lb)    Height: '5\' 9"'  (1.753 m)      Physical Exam   Constitutional: He is oriented to person, place, and time. He appears well-developed and well-nourished. No distress.   HENT:   Head:  Normocephalic and atraumatic.   Right Ear: External ear normal.   Left Ear: External ear normal.   Mouth/Throat: Oropharynx is clear and moist. No oropharyngeal exudate.   Eyes: Pupils are equal, round, and reactive to light. Conjunctivae and EOM are normal. No scleral icterus.   No pallor   Neck: Normal range of motion. Neck supple. No JVD present. No tracheal deviation present. No thyromegaly present.   Cardiovascular: Normal rate, regular rhythm and normal heart sounds.   Pulmonary/Chest: Effort normal and breath sounds normal. No stridor. No respiratory distress.   Non-tender cardiac device to his left anterior chest wall   Abdominal: Soft. Bowel sounds are normal. He exhibits no distension. There is no tenderness. There is no rebound and no guarding.   Musculoskeletal: Normal range of motion. He exhibits no edema or tenderness.   No soft tissue injuries   Lymphadenopathy:     He has no cervical adenopathy.   Neurological: He is alert and oriented to person, place, and time. He has normal reflexes. No cranial nerve deficit. Coordination normal.   Skin: Skin is warm and dry. No rash noted. He is not diaphoretic. No erythema.   Psychiatric: He has a normal mood and affect. His behavior is normal. Judgment and thought content normal.   Nursing note and vitals reviewed.      Diagnostic Study Results     Labs -     Recent Results (from the past 12 hour(s))   URINALYSIS W/ RFLX MICROSCOPIC    Collection Time: 10/11/18  9:45 PM   Result Value Ref Range    Color YELLOW      Appearance CLEAR      Specific gravity 1.015 1.005 - 1.030      pH (UA) 5.5 5.0 - 8.0      Protein NEGATIVE  NEG mg/dL    Glucose NEGATIVE  NEG mg/dL    Ketone NEGATIVE  NEG mg/dL    Bilirubin NEGATIVE  NEG      Blood NEGATIVE  NEG      Urobilinogen 1.0 0.2 - 1.0 EU/dL    Nitrites  NEGATIVE  NEG      Leukocyte Esterase NEGATIVE  NEG     DRUG SCREEN, URINE    Collection Time: 10/11/18  9:45 PM   Result Value Ref Range    BENZODIAZEPINES NEGATIVE  NEG       BARBITURATES NEGATIVE  NEG      THC (TH-CANNABINOL) NEGATIVE  NEG      OPIATES NEGATIVE  NEG      PCP(PHENCYCLIDINE) NEGATIVE  NEG      COCAINE NEGATIVE  NEG      AMPHETAMINES NEGATIVE  NEG      METHADONE NEGATIVE  NEG      HDSCOM (NOTE)    SALICYLATE    Collection Time: 10/11/18  9:50 PM   Result Value Ref Range    Salicylate level 4.7 2.8 - 20.0 MG/DL   ACETAMINOPHEN    Collection Time: 10/11/18  9:50 PM   Result Value Ref Range    Acetaminophen level <2 (L) 10.0 - 30.0 ug/mL   CBC WITH AUTOMATED DIFF    Collection Time: 10/11/18  9:50 PM   Result Value Ref Range    WBC 3.3 (L) 4.6 - 13.2 K/uL    RBC 5.22 4.70 - 5.50 M/uL    HGB 16.9 (H) 13.0 - 16.0 g/dL    HCT 47.4 36.0 - 48.0 %    MCV 90.8 74.0 - 97.0 FL    MCH 32.4 24.0 - 34.0 PG    MCHC 35.7 31.0 - 37.0 g/dL    RDW 13.4 11.6 - 14.5 %    PLATELET 168 135 - 420 K/uL    MPV 9.7 9.2 - 11.8 FL    NEUTROPHILS 20 (L) 42 - 75 %    LYMPHOCYTES 68 (H) 20 - 51 %    MONOCYTES 10 (H) 2 - 9 %    EOSINOPHILS 2 0 - 5 %    BASOPHILS 0 0 - 3 %    ABS. NEUTROPHILS 0.7 (L) 1.8 - 8.0 K/UL    ABS. LYMPHOCYTES 2.2 0.8 - 3.5 K/UL    ABS. MONOCYTES 0.3 0 - 1.0 K/UL    ABS. EOSINOPHILS 0.1 0.0 - 0.4 K/UL    ABS. BASOPHILS 0.0 0.0 - 0.1 K/UL    RBC COMMENTS NORMOCYTIC, NORMOCHROMIC      WBC COMMENTS REACTIVE LYMPHS      DF MANUAL     METABOLIC PANEL, COMPREHENSIVE    Collection Time: 10/11/18  9:50 PM   Result Value Ref Range    Sodium 142 136 - 145 mmol/L    Potassium 3.6 3.5 - 5.5 mmol/L    Chloride 110 100 - 111 mmol/L    CO2 26 21 - 32 mmol/L    Anion gap 6 3.0 - 18 mmol/L    Glucose 86 74 - 99 mg/dL    BUN 18 7.0 - 18 MG/DL    Creatinine 1.23 0.6 - 1.3 MG/DL    BUN/Creatinine ratio 15 12 - 20      GFR est AA >60 >60 ml/min/1.85m    GFR est non-AA >60 >60 ml/min/1.767m   Calcium 8.3 (L) 8.5 - 10.1 MG/DL    Bilirubin, total 0.9 0.2 - 1.0 MG/DL    ALT (SGPT) 30 16 - 61 U/L    AST (SGOT) 22 10 - 38 U/L    Alk. phosphatase 57 45 - 117 U/L    Protein, total 7.1 6.4 - 8.2 g/dL     Albumin 3.9 3.4 - 5.0  g/dL    Globulin 3.2 2.0 - 4.0 g/dL    A-G Ratio 1.2 0.8 - 1.7     ETHYL ALCOHOL    Collection Time: 10/11/18  9:50 PM   Result Value Ref Range    ALCOHOL(ETHYL),SERUM <3 0 - 3 MG/DL   EKG, 12 LEAD, INITIAL    Collection Time: 10/11/18 10:35 PM   Result Value Ref Range    Ventricular Rate 75 BPM    Atrial Rate 75 BPM    P-R Interval 228 ms    QRS Duration 86 ms    Q-T Interval 412 ms    QTC Calculation (Bezet) 460 ms    Calculated P Axis 67 degrees    Calculated R Axis 11 degrees    Calculated T Axis -34 degrees    Diagnosis       Sinus rhythm with 1st degree AV block  Septal infarct , age undetermined  Possible Inferior infarct , age undetermined  T wave abnormality, consider lateral ischemia  Abnormal ECG  No previous ECGs available         Radiologic Studies -   No orders to display     CT Results  (Last 48 hours)    None        CXR Results  (Last 48 hours)    None                Medical Decision Making   I am the first provider for this patient.    I reviewed the vital signs, available nursing notes, past medical history, past surgical history, family history and social history.    Vital Signs-Reviewed the patient's vital signs.    Pulse Oximetry Analysis - 100% on RA     EKG interpretation: (Preliminary)  Sinus rhythm with primary AV block. Rate 75 bpm. Q-waves to V1, V2, and V3. Inverted T-waves II, III, AVF, V5, and V6.  EKG read by Leonie Douglas. Scot Dock, MD at 2235    Records Reviewed: Nursing Notes    Provider Notes (Medical Decision Making): Will use EKG to help clear cardiac pacer. He has had prior admissions for the same. He has a positive plan. Once he is medically cleared, will discuss case with case management for placement.     PROCEDURES:  Procedures    MEDICATIONS GIVEN IN THE ED:  Medications - No data to display     ED COURSE:   10:11 PM   Initial assessment performed.    EKG without arrythmia; labs back with normal VS. He is medically cleared and voluntary for psychiatric  treatment    CONSULT NOTE:  10:43 PM  Leonie Douglas. Scot Dock, MD spoke with Case Management,  Discussed pt's hx, disposition, and available diagnostic and imaging results. Reviewed care plans. They are going to put his name in the list for [facility].  Written by Blaine Hamper Marijean Bravo, ED Scribe, as dictated by Leonie Douglas Scot Dock, MD.    PROGRESS NOTE:    Pt and/or family have been updated on their results. Pt and/or pt's family are aware of the plan of care and are in agreement.  Written by Blaine Hamper Marijean Bravo, ED Scribe, as dictated by Leonie Douglas Scot Dock, MD.      Diagnosis and Disposition     9:20 AM  I have spent 40 minutes of critical care time involved in lab review, consultations with specialist, family decision-making, and documentation.  During this entire length of time I was immediately available to the patient.  Critical Care:  The reason for providing this level of medical care for this critically ill patient was due a critical illness that impaired one or more vital organ systems such that there was a high probability of imminent or life threatening deterioration in the patients condition. This care involved high complexity decision making to assess, manipulate, and support vital system functions, to treat this degreee vital organ system failure and to prevent further life threatening deterioration of the patient???s condition.      CONSULT NOTE:   9:20 AM  Case manager spoke with Casimer Lanius  Specialty: Psychiatry  Discussed pt's hx, disposition, and available diagnostic and imaging results. Reviewed care plans. Consulting physician agrees with plans as outlined.  Transfer to [employer] by Toy Cookey, DO    TRANSFER PROGRESS NOTE:    9:20 AM  Discussed impending transfer with Patient and/or family.  Pt and/or family instructed that Pt would be transferred to [facility].  Discussed reasoning for transfer and future treatment plan. Family and Pt understands and agrees with  care plan.  Written by Sallyanne Kuster Agustiady-Becker, DO    Labs Reviewed   ACETAMINOPHEN - Abnormal; Notable for the following components:       Result Value    Acetaminophen level <2 (*)     All other components within normal limits   CBC WITH AUTOMATED DIFF - Abnormal; Notable for the following components:    WBC 3.3 (*)     HGB 16.9 (*)     NEUTROPHILS 20 (*)     LYMPHOCYTES 68 (*)     MONOCYTES 10 (*)     ABS. NEUTROPHILS 0.7 (*)     All other components within normal limits   METABOLIC PANEL, COMPREHENSIVE - Abnormal; Notable for the following components:    Calcium 8.3 (*)     All other components within normal limits   SALICYLATE   ETHYL ALCOHOL   URINALYSIS W/ RFLX MICROSCOPIC   DRUG SCREEN, URINE       Recent Results (from the past 12 hour(s))   URINALYSIS W/ RFLX MICROSCOPIC    Collection Time: 10/11/18  9:45 PM   Result Value Ref Range    Color YELLOW      Appearance CLEAR      Specific gravity 1.015 1.005 - 1.030      pH (UA) 5.5 5.0 - 8.0      Protein NEGATIVE  NEG mg/dL    Glucose NEGATIVE  NEG mg/dL    Ketone NEGATIVE  NEG mg/dL    Bilirubin NEGATIVE  NEG      Blood NEGATIVE  NEG      Urobilinogen 1.0 0.2 - 1.0 EU/dL    Nitrites NEGATIVE  NEG      Leukocyte Esterase NEGATIVE  NEG     DRUG SCREEN, URINE    Collection Time: 10/11/18  9:45 PM   Result Value Ref Range    BENZODIAZEPINES NEGATIVE  NEG      BARBITURATES NEGATIVE  NEG      THC (TH-CANNABINOL) NEGATIVE  NEG      OPIATES NEGATIVE  NEG      PCP(PHENCYCLIDINE) NEGATIVE  NEG      COCAINE NEGATIVE  NEG      AMPHETAMINES NEGATIVE  NEG      METHADONE NEGATIVE  NEG      HDSCOM (NOTE)    SALICYLATE    Collection Time: 10/11/18  9:50 PM   Result Value Ref  Range    Salicylate level 4.7 2.8 - 20.0 MG/DL   ACETAMINOPHEN    Collection Time: 10/11/18  9:50 PM   Result Value Ref Range    Acetaminophen level <2 (L) 10.0 - 30.0 ug/mL   CBC WITH AUTOMATED DIFF    Collection Time: 10/11/18  9:50 PM   Result Value Ref Range    WBC 3.3 (L) 4.6 - 13.2 K/uL    RBC  5.22 4.70 - 5.50 M/uL    HGB 16.9 (H) 13.0 - 16.0 g/dL    HCT 47.4 36.0 - 48.0 %    MCV 90.8 74.0 - 97.0 FL    MCH 32.4 24.0 - 34.0 PG    MCHC 35.7 31.0 - 37.0 g/dL    RDW 13.4 11.6 - 14.5 %    PLATELET 168 135 - 420 K/uL    MPV 9.7 9.2 - 11.8 FL    NEUTROPHILS 20 (L) 42 - 75 %    LYMPHOCYTES 68 (H) 20 - 51 %    MONOCYTES 10 (H) 2 - 9 %    EOSINOPHILS 2 0 - 5 %    BASOPHILS 0 0 - 3 %    ABS. NEUTROPHILS 0.7 (L) 1.8 - 8.0 K/UL    ABS. LYMPHOCYTES 2.2 0.8 - 3.5 K/UL    ABS. MONOCYTES 0.3 0 - 1.0 K/UL    ABS. EOSINOPHILS 0.1 0.0 - 0.4 K/UL    ABS. BASOPHILS 0.0 0.0 - 0.1 K/UL    RBC COMMENTS NORMOCYTIC, NORMOCHROMIC      WBC COMMENTS REACTIVE LYMPHS      DF MANUAL     METABOLIC PANEL, COMPREHENSIVE    Collection Time: 10/11/18  9:50 PM   Result Value Ref Range    Sodium 142 136 - 145 mmol/L    Potassium 3.6 3.5 - 5.5 mmol/L    Chloride 110 100 - 111 mmol/L    CO2 26 21 - 32 mmol/L    Anion gap 6 3.0 - 18 mmol/L    Glucose 86 74 - 99 mg/dL    BUN 18 7.0 - 18 MG/DL    Creatinine 1.23 0.6 - 1.3 MG/DL    BUN/Creatinine ratio 15 12 - 20      GFR est AA >60 >60 ml/min/1.57m    GFR est non-AA >60 >60 ml/min/1.739m   Calcium 8.3 (L) 8.5 - 10.1 MG/DL    Bilirubin, total 0.9 0.2 - 1.0 MG/DL    ALT (SGPT) 30 16 - 61 U/L    AST (SGOT) 22 10 - 38 U/L    Alk. phosphatase 57 45 - 117 U/L    Protein, total 7.1 6.4 - 8.2 g/dL    Albumin 3.9 3.4 - 5.0 g/dL    Globulin 3.2 2.0 - 4.0 g/dL    A-G Ratio 1.2 0.8 - 1.7     ETHYL ALCOHOL    Collection Time: 10/11/18  9:50 PM   Result Value Ref Range    ALCOHOL(ETHYL),SERUM <3 0 - 3 MG/DL   EKG, 12 LEAD, INITIAL    Collection Time: 10/11/18 10:35 PM   Result Value Ref Range    Ventricular Rate 75 BPM    Atrial Rate 75 BPM    P-R Interval 228 ms    QRS Duration 86 ms    Q-T Interval 412 ms    QTC Calculation (Bezet) 460 ms    Calculated P Axis 67 degrees    Calculated R Axis 11 degrees    Calculated  T Axis -34 degrees    Diagnosis       Sinus rhythm with 1st degree AV block  Septal infarct ,  age undetermined  Possible Inferior infarct , age undetermined  T wave abnormality, consider lateral ischemia  Abnormal ECG  No previous ECGs available         CLINICAL IMPRESSION    1. Suicidal ideations    2. Severe episode of recurrent major depressive disorder, with psychotic features ([place])    3. HIV positive ([place])    4. Presence of automatic implantable cardioverter-defibrillator                  _______________________________    Attestations:  This note is prepared by Molli Knock, acting as Education administrator for [employer]. Scot Dock, MD.    Leonie Douglas. Scot Dock, MD:  The scribe's documentation has been prepared under my direction and personally reviewed by me in its entirety. I confirm that the note above accurately reflects all work, treatment, procedures, and medical decision making performed by me.  _______________________________

## 2018-10-11 NOTE — ED Notes (Signed)
 Pt ambulated into er with suicidal thoughts, pt states that he wants to jump out if front of a truck. Pt recently homeless, has been staying at [facility] but states today his time was up

## 2018-10-11 NOTE — Progress Notes (Signed)
Page received. Discussed case with ER MD.  Pt needing voluntary psych placement. Referral for inpt voluntary placement placed with [facility].  Per intake no beds at this time.  Will follow up in morning if placement still needed.  If pt becomes involuntary at any point during night, please involve [facility].

## 2018-10-11 NOTE — ED Provider Notes (Signed)
EMERGENCY DEPARTMENT HISTORY AND PHYSICAL EXAM    Date: 10/11/2018  Patient Name: Jerry [name] [facility]    History of Presenting Illness     Chief Complaint   Patient presents with   ??? Suicidal         History Provided By: Patient    Additional History (Context):     10:11 PM    Jerry [name] is a 57 y.o. male with pertinent PMHx of HTN, HIV (diagnosed in '94: his viral load is currently undetectable and he has a high CD4 count; compliant with medications), heart failure, cardiac defibrillator (placed January 2019), anxiety, depression, and SI presenting ambulatory to the ED c/o SI x for the last few days. Pt states that he intended to jump out in front of a truck this afternoon. Pt also notes auditory hallucinations which are also new. He states that he hears his mother. Pt states that he grew up in the area and relocated to [place], [place] after a positive HIV diagnosis that estranged him from his family. Pt lived in [place] until he lost his apartment recently. He came back to [place] to find his family, but has been unsuccessful in his search. 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 He has been staying at [facility], but states that his time there "is up". He has been able to maintain compliance with his regular medications, despite all of the recent living situation changes. He notes a FHx of MI. He denies any personal h/o stroke. Pt smokes ~15 cigarettes/day and has been successful in decreasing his use from 3-4 ppd. He used to drink and do illicit drugs, but nothing in the last few years.    PCP: Other, Phys, MD    There are no other complaints, changes, or physical findings at this time.     Current Outpatient Medications   Medication Sig Dispense Refill   ??? digoxin (LANOXIN) 0.125 mg tablet Take  by mouth daily.     ??? metoprolol succinate (TOPROL XL) 100 mg tablet Take  by mouth daily.     ??? eplerenone (INSPRA) 25 mg tablet Take  by mouth daily.      ??? bictegrav-emtricit-tenofov ala (BIKTARVY) tab tablet Take 1 Tab by mouth daily.     ??? hydrOXYzine HCl (ATARAX) 25 mg tablet Take  by mouth three (3) times daily as needed for Itching.     ??? valACYclovir (VALTREX) 500 mg tablet Take  by mouth two (2) times a day.     ??? potassium chloride (K-DUR, KLOR-CON) 20 mEq tablet Take  by mouth two (2) times a day.     ??? tamsulosin (FLOMAX) 0.4 mg capsule Take 0.4 mg by mouth daily.     ??? losartan (COZAAR) 50 mg tablet Take  by mouth daily.     ??? sildenafil citrate (VIAGRA) 50 mg tablet Take 50 mg by mouth as needed.     ??? furosemide (LASIX) 40 mg tablet Take  by mouth daily.     ??? aspirin 81 mg chewable tablet Take 81 mg by mouth daily.         Past History     Past Medical History:  Past Medical History:   Diagnosis Date   ??? Anxiety    ??? Depression    ??? Heart failure ([place])    ??? HIV (human immunodeficiency virus infection) ([place])    ??? Hypertension    ??? Pacemaker    ??? Suicidal  ideation        Past Surgical History:  No past surgical history on file.    Family History:  No family history on file.    Social History:  Social History     Tobacco Use   ??? Smoking status: Not on file   Substance Use Topics   ??? Alcohol use: Not on file   ??? Drug use: Not on file       Allergies:  No Known Allergies      Review of Systems   Review of Systems   Constitutional: Negative.    Respiratory: Negative.    Cardiovascular: Negative.  Negative for leg swelling.   Genitourinary: Negative.    Psychiatric/Behavioral: Positive for hallucinations (auditory) and suicidal ideas.   All other systems reviewed and are negative.      Physical Exam     Vitals:    10/11/18 2122 10/12/18 0500   BP: (!) 131/103 108/63   Pulse: 81 71   Resp: 14 14   Temp: 97.6 ??F (36.4 ??C) 97.8 ??F (36.6 ??C)   SpO2: 100% 100%   Weight: 93.9 kg (207 lb)    Height: '5\' 9"'  (1.753 m)      Physical Exam   Constitutional: He is oriented to person, place, and time. He appears well-developed and well-nourished. No distress.   HENT:    Head: Normocephalic and atraumatic.   Right Ear: External ear normal.   Left Ear: External ear normal.   Mouth/Throat: Oropharynx is clear and moist. No oropharyngeal exudate.   Eyes: Pupils are equal, round, and reactive to light. Conjunctivae and EOM are normal. No scleral icterus.   No pallor   Neck: Normal range of motion. Neck supple. No JVD present. No tracheal deviation present. No thyromegaly present.   Cardiovascular: Normal rate, regular rhythm and normal heart sounds.   Pulmonary/Chest: Effort normal and breath sounds normal. No stridor. No respiratory distress.   Non-tender cardiac device to his left anterior chest wall   Abdominal: Soft. Bowel sounds are normal. He exhibits no distension. There is no tenderness. There is no rebound and no guarding.   Musculoskeletal: Normal range of motion. He exhibits no edema or tenderness.   No soft tissue injuries   Lymphadenopathy:     He has no cervical adenopathy.   Neurological: He is alert and oriented to person, place, and time. He has normal reflexes. No cranial nerve deficit. Coordination normal.   Skin: Skin is warm and dry. No rash noted. He is not diaphoretic. No erythema.   Psychiatric: He has a normal mood and affect. His behavior is normal. Judgment and thought content normal.   Nursing note and vitals reviewed.      Diagnostic Study Results     Labs -     Recent Results (from the past 12 hour(s))   URINALYSIS W/ RFLX MICROSCOPIC    Collection Time: 10/11/18  9:45 PM   Result Value Ref Range    Color YELLOW      Appearance CLEAR      Specific gravity 1.015 1.005 - 1.030      pH (UA) 5.5 5.0 - 8.0      Protein NEGATIVE  NEG mg/dL    Glucose NEGATIVE  NEG mg/dL    Ketone NEGATIVE  NEG mg/dL    Bilirubin NEGATIVE  NEG      Blood NEGATIVE  NEG      Urobilinogen 1.0 0.2 - 1.0 EU/dL    Nitrites  NEGATIVE  NEG      Leukocyte Esterase NEGATIVE  NEG     DRUG SCREEN, URINE    Collection Time: 10/11/18  9:45 PM   Result Value Ref Range     BENZODIAZEPINES NEGATIVE  NEG      BARBITURATES NEGATIVE  NEG      THC (TH-CANNABINOL) NEGATIVE  NEG      OPIATES NEGATIVE  NEG      PCP(PHENCYCLIDINE) NEGATIVE  NEG      COCAINE NEGATIVE  NEG      AMPHETAMINES NEGATIVE  NEG      METHADONE NEGATIVE  NEG      HDSCOM (NOTE)    SALICYLATE    Collection Time: 10/11/18  9:50 PM   Result Value Ref Range    Salicylate level 4.7 2.8 - 20.0 MG/DL   ACETAMINOPHEN    Collection Time: 10/11/18  9:50 PM   Result Value Ref Range    Acetaminophen level <2 (L) 10.0 - 30.0 ug/mL   CBC WITH AUTOMATED DIFF    Collection Time: 10/11/18  9:50 PM   Result Value Ref Range    WBC 3.3 (L) 4.6 - 13.2 K/uL    RBC 5.22 4.70 - 5.50 M/uL    HGB 16.9 (H) 13.0 - 16.0 g/dL    HCT 47.4 36.0 - 48.0 %    MCV 90.8 74.0 - 97.0 FL    MCH 32.4 24.0 - 34.0 PG    MCHC 35.7 31.0 - 37.0 g/dL    RDW 13.4 11.6 - 14.5 %    PLATELET 168 135 - 420 K/uL    MPV 9.7 9.2 - 11.8 FL    NEUTROPHILS 20 (L) 42 - 75 %    LYMPHOCYTES 68 (H) 20 - 51 %    MONOCYTES 10 (H) 2 - 9 %    EOSINOPHILS 2 0 - 5 %    BASOPHILS 0 0 - 3 %    ABS. NEUTROPHILS 0.7 (L) 1.8 - 8.0 K/UL    ABS. LYMPHOCYTES 2.2 0.8 - 3.5 K/UL    ABS. MONOCYTES 0.3 0 - 1.0 K/UL    ABS. EOSINOPHILS 0.1 0.0 - 0.4 K/UL    ABS. BASOPHILS 0.0 0.0 - 0.1 K/UL    RBC COMMENTS NORMOCYTIC, NORMOCHROMIC      WBC COMMENTS REACTIVE LYMPHS      DF MANUAL     METABOLIC PANEL, COMPREHENSIVE    Collection Time: 10/11/18  9:50 PM   Result Value Ref Range    Sodium 142 136 - 145 mmol/L    Potassium 3.6 3.5 - 5.5 mmol/L    Chloride 110 100 - 111 mmol/L    CO2 26 21 - 32 mmol/L    Anion gap 6 3.0 - 18 mmol/L    Glucose 86 74 - 99 mg/dL    BUN 18 7.0 - 18 MG/DL    Creatinine 1.23 0.6 - 1.3 MG/DL    BUN/Creatinine ratio 15 12 - 20      GFR est AA >60 >60 ml/min/1.66m    GFR est non-AA >60 >60 ml/min/1.751m   Calcium 8.3 (L) 8.5 - 10.1 MG/DL    Bilirubin, total 0.9 0.2 - 1.0 MG/DL    ALT (SGPT) 30 16 - 61 U/L    AST (SGOT) 22 10 - 38 U/L    Alk. phosphatase 57 45 - 117 U/L     Protein, total 7.1 6.4 - 8.2 g/dL    Albumin 3.9 3.4 - 5.0  g/dL    Globulin 3.2 2.0 - 4.0 g/dL    A-G Ratio 1.2 0.8 - 1.7     ETHYL ALCOHOL    Collection Time: 10/11/18  9:50 PM   Result Value Ref Range    ALCOHOL(ETHYL),SERUM <3 0 - 3 MG/DL   EKG, 12 LEAD, INITIAL    Collection Time: 10/11/18 10:35 PM   Result Value Ref Range    Ventricular Rate 75 BPM    Atrial Rate 75 BPM    P-R Interval 228 ms    QRS Duration 86 ms    Q-T Interval 412 ms    QTC Calculation (Bezet) 460 ms    Calculated P Axis 67 degrees    Calculated R Axis 11 degrees    Calculated T Axis -34 degrees    Diagnosis       Sinus rhythm with 1st degree AV block  Septal infarct , age undetermined  Possible Inferior infarct , age undetermined  T wave abnormality, consider lateral ischemia  Abnormal ECG  No previous ECGs available         Radiologic Studies -   No orders to display     CT Results  (Last 48 hours)    None        CXR Results  (Last 48 hours)    None                Medical Decision Making   I am the first provider for this patient.    I reviewed the vital signs, available nursing notes, past medical history, past surgical history, family history and social history.    Vital Signs-Reviewed the patient's vital signs.    Pulse Oximetry Analysis - 100% on RA     EKG interpretation: (Preliminary)  Sinus rhythm with primary AV block. Rate 75 bpm. Q-waves to V1, V2, and V3. Inverted T-waves II, III, AVF, V5, and V6.  EKG read by Leonie Douglas. Scot Dock, MD at 2235    Records Reviewed: Nursing Notes    Provider Notes (Medical Decision Making): Will use EKG to help clear cardiac pacer. He has had prior admissions for the same. He has a positive plan. Once he is medically cleared, will discuss case with case management for placement.     PROCEDURES:  Procedures    MEDICATIONS GIVEN IN THE ED:  Medications - No data to display     ED COURSE:   10:11 PM   Initial assessment performed.     EKG without arrythmia; labs back with normal VS. He is medically cleared and voluntary for psychiatric treatment    CONSULT NOTE:  10:43 PM  Leonie Douglas. Scot Dock, MD spoke with Case Management,  Discussed pt's hx, disposition, and available diagnostic and imaging results. Reviewed care plans. They are going to put his name in the list for [facility].  Written by Blaine Hamper Marijean Bravo, ED Scribe, as dictated by Leonie Douglas Scot Dock, MD.    PROGRESS NOTE:    Pt and/or family have been updated on their results. Pt and/or pt's family are aware of the plan of care and are in agreement.  Written by Blaine Hamper Marijean Bravo, ED Scribe, as dictated by Leonie Douglas Scot Dock, MD.      Diagnosis and Disposition     9:20 AM  I have spent 40 minutes of critical care time involved in lab review, consultations with specialist, family decision-making, and documentation.  During this entire length of time I was immediately available to the patient.  Critical Care:  The reason for providing this level of medical care for this critically ill patient was due a critical illness that impaired one or more vital organ systems such that there was a high probability of imminent or life threatening deterioration in the patients condition. This care involved high complexity decision making to assess, manipulate, and support vital system functions, to treat this degreee vital organ system failure and to prevent further life threatening deterioration of the patient???s condition.      CONSULT NOTE:   9:20 AM  Case manager spoke with Casimer Lanius  Specialty: Psychiatry  Discussed pt's hx, disposition, and available diagnostic and imaging results. Reviewed care plans. Consulting physician agrees with plans as outlined.  Transfer to [employer] by Toy Cookey, DO    TRANSFER PROGRESS NOTE:    9:20 AM  Discussed impending transfer with Patient and/or family.  Pt and/or family  instructed that Pt would be transferred to [facility].  Discussed reasoning for transfer and future treatment plan. Family and Pt understands and agrees with care plan.  Written by Sallyanne Kuster Agustiady-Becker, DO    Labs Reviewed   ACETAMINOPHEN - Abnormal; Notable for the following components:       Result Value    Acetaminophen level <2 (*)     All other components within normal limits   CBC WITH AUTOMATED DIFF - Abnormal; Notable for the following components:    WBC 3.3 (*)     HGB 16.9 (*)     NEUTROPHILS 20 (*)     LYMPHOCYTES 68 (*)     MONOCYTES 10 (*)     ABS. NEUTROPHILS 0.7 (*)     All other components within normal limits   METABOLIC PANEL, COMPREHENSIVE - Abnormal; Notable for the following components:    Calcium 8.3 (*)     All other components within normal limits   SALICYLATE   ETHYL ALCOHOL   URINALYSIS W/ RFLX MICROSCOPIC   DRUG SCREEN, URINE       Recent Results (from the past 12 hour(s))   URINALYSIS W/ RFLX MICROSCOPIC    Collection Time: 10/11/18  9:45 PM   Result Value Ref Range    Color YELLOW      Appearance CLEAR      Specific gravity 1.015 1.005 - 1.030      pH (UA) 5.5 5.0 - 8.0      Protein NEGATIVE  NEG mg/dL    Glucose NEGATIVE  NEG mg/dL    Ketone NEGATIVE  NEG mg/dL    Bilirubin NEGATIVE  NEG      Blood NEGATIVE  NEG      Urobilinogen 1.0 0.2 - 1.0 EU/dL    Nitrites NEGATIVE  NEG      Leukocyte Esterase NEGATIVE  NEG     DRUG SCREEN, URINE    Collection Time: 10/11/18  9:45 PM   Result Value Ref Range    BENZODIAZEPINES NEGATIVE  NEG      BARBITURATES NEGATIVE  NEG      THC (TH-CANNABINOL) NEGATIVE  NEG      OPIATES NEGATIVE  NEG      PCP(PHENCYCLIDINE) NEGATIVE  NEG      COCAINE NEGATIVE  NEG      AMPHETAMINES NEGATIVE  NEG      METHADONE NEGATIVE  NEG      HDSCOM (NOTE)    SALICYLATE    Collection Time: 10/11/18  9:50 PM   Result Value Ref  Range    Salicylate level 4.7 2.8 - 20.0 MG/DL   ACETAMINOPHEN    Collection Time: 10/11/18  9:50 PM   Result Value Ref Range     Acetaminophen level <2 (L) 10.0 - 30.0 ug/mL   CBC WITH AUTOMATED DIFF    Collection Time: 10/11/18  9:50 PM   Result Value Ref Range    WBC 3.3 (L) 4.6 - 13.2 K/uL    RBC 5.22 4.70 - 5.50 M/uL    HGB 16.9 (H) 13.0 - 16.0 g/dL    HCT 47.4 36.0 - 48.0 %    MCV 90.8 74.0 - 97.0 FL    MCH 32.4 24.0 - 34.0 PG    MCHC 35.7 31.0 - 37.0 g/dL    RDW 13.4 11.6 - 14.5 %    PLATELET 168 135 - 420 K/uL    MPV 9.7 9.2 - 11.8 FL    NEUTROPHILS 20 (L) 42 - 75 %    LYMPHOCYTES 68 (H) 20 - 51 %    MONOCYTES 10 (H) 2 - 9 %    EOSINOPHILS 2 0 - 5 %    BASOPHILS 0 0 - 3 %    ABS. NEUTROPHILS 0.7 (L) 1.8 - 8.0 K/UL    ABS. LYMPHOCYTES 2.2 0.8 - 3.5 K/UL    ABS. MONOCYTES 0.3 0 - 1.0 K/UL    ABS. EOSINOPHILS 0.1 0.0 - 0.4 K/UL    ABS. BASOPHILS 0.0 0.0 - 0.1 K/UL    RBC COMMENTS NORMOCYTIC, NORMOCHROMIC      WBC COMMENTS REACTIVE LYMPHS      DF MANUAL     METABOLIC PANEL, COMPREHENSIVE    Collection Time: 10/11/18  9:50 PM   Result Value Ref Range    Sodium 142 136 - 145 mmol/L    Potassium 3.6 3.5 - 5.5 mmol/L    Chloride 110 100 - 111 mmol/L    CO2 26 21 - 32 mmol/L    Anion gap 6 3.0 - 18 mmol/L    Glucose 86 74 - 99 mg/dL    BUN 18 7.0 - 18 MG/DL    Creatinine 1.23 0.6 - 1.3 MG/DL    BUN/Creatinine ratio 15 12 - 20      GFR est AA >60 >60 ml/min/1.38m    GFR est non-AA >60 >60 ml/min/1.740m   Calcium 8.3 (L) 8.5 - 10.1 MG/DL    Bilirubin, total 0.9 0.2 - 1.0 MG/DL    ALT (SGPT) 30 16 - 61 U/L    AST (SGOT) 22 10 - 38 U/L    Alk. phosphatase 57 45 - 117 U/L    Protein, total 7.1 6.4 - 8.2 g/dL    Albumin 3.9 3.4 - 5.0 g/dL    Globulin 3.2 2.0 - 4.0 g/dL    A-G Ratio 1.2 0.8 - 1.7     ETHYL ALCOHOL    Collection Time: 10/11/18  9:50 PM   Result Value Ref Range    ALCOHOL(ETHYL),SERUM <3 0 - 3 MG/DL   EKG, 12 LEAD, INITIAL    Collection Time: 10/11/18 10:35 PM   Result Value Ref Range    Ventricular Rate 75 BPM    Atrial Rate 75 BPM    P-R Interval 228 ms    QRS Duration 86 ms    Q-T Interval 412 ms     QTC Calculation (Bezet) 460 ms    Calculated P Axis 67 degrees    Calculated R Axis 11 degrees  Calculated T Axis -34 degrees    Diagnosis       Sinus rhythm with 1st degree AV block  Septal infarct , age undetermined  Possible Inferior infarct , age undetermined  T wave abnormality, consider lateral ischemia  Abnormal ECG  No previous ECGs available         CLINICAL IMPRESSION    1. Suicidal ideations    2. Severe episode of recurrent major depressive disorder, with psychotic features ([place])    3. HIV positive ([place])    4. Presence of automatic implantable cardioverter-defibrillator                  _______________________________    Attestations:  This note is prepared by Molli Knock, acting as Education administrator for [employer]. Scot Dock, MD.    Leonie Douglas. Scot Dock, MD:  The scribe's documentation has been prepared under my direction and personally reviewed by me in its entirety. I confirm that the note above accurately reflects all work, treatment, procedures, and medical decision making performed by me.  _______________________________

## 2018-10-11 NOTE — ED Triage Notes (Signed)
Pt ambulated into er with suicidal thoughts, pt states that he wants to jump out if front of a truck. Pt recently homeless, has [facility] but states today his "time was up"

## 2018-10-12 ENCOUNTER — Inpatient Hospital Stay
Admit: 2018-10-12 | Discharge: 2018-10-17 | Disposition: A | Discharge status: Home / Self Care | Payer: MEDICARE | Source: Other Acute Inpatient Hospital | Attending: Psychiatry | Admitting: Psychiatry

## 2018-10-12 ENCOUNTER — Inpatient Hospital Stay
Admit: 2018-10-12 | Discharge: 2018-10-12 | Disposition: A | Discharge status: Other Acute Inpatient Hospital | Payer: MEDICARE | Attending: Emergency Medicine

## 2018-10-12 DIAGNOSIS — F333 Major depressive disorder, recurrent, severe with psychotic symptoms: Secondary | ICD-10-CM

## 2018-10-12 DIAGNOSIS — Z751 Person awaiting admission to adequate facility elsewhere: Secondary | ICD-10-CM | POA: Diagnosis not present

## 2018-10-12 DIAGNOSIS — F323 Major depressive disorder, single episode, severe with psychotic features: Secondary | ICD-10-CM | POA: Diagnosis not present

## 2018-10-12 DIAGNOSIS — R45851 Suicidal ideations: Secondary | ICD-10-CM | POA: Diagnosis not present

## 2018-10-12 DIAGNOSIS — B20 Human immunodeficiency virus [HIV] disease: Secondary | ICD-10-CM | POA: Diagnosis not present

## 2018-10-12 LAB — DRUG SCREEN, URINE
AMPHETAMINES: NEGATIVE
Amphetamine Screen, Urine: NEGATIVE
BARBITURATES: NEGATIVE
BENZODIAZEPINES: NEGATIVE
Barbiturate Screen, Urine: NEGATIVE
Benzodiazepine Screen, Urine: NEGATIVE
COCAINE: NEGATIVE
Cocaine Screen Urine: NEGATIVE
METHADONE: NEGATIVE
Methadone Screen, Urine: NEGATIVE
OPIATES: NEGATIVE
Opiate Screen, Urine: NEGATIVE
PCP Screen, Urine: NEGATIVE
PCP(PHENCYCLIDINE): NEGATIVE
THC (TH-CANNABINOL): NEGATIVE
THC Screen, Urine: NEGATIVE

## 2018-10-12 LAB — CBC WITH AUTOMATED DIFF
ABS. BASOPHILS: 0 10*3/uL (ref 0.0–0.1)
ABS. EOSINOPHILS: 0.1 10*3/uL (ref 0.0–0.4)
ABS. LYMPHOCYTES: 2.2 10*3/uL (ref 0.8–3.5)
ABS. MONOCYTES: 0.3 10*3/uL (ref 0–1.0)
ABS. NEUTROPHILS: 0.7 10*3/uL — ABNORMAL LOW (ref 1.8–8.0)
BASOPHILS: 0 % (ref 0–3)
EOSINOPHILS: 2 % (ref 0–5)
HCT: 47.4 % (ref 36.0–48.0)
HGB: 16.9 g/dL — ABNORMAL HIGH (ref 13.0–16.0)
LYMPHOCYTES: 68 % — ABNORMAL HIGH (ref 20–51)
MCH: 32.4 PG (ref 24.0–34.0)
MCHC: 35.7 g/dL (ref 31.0–37.0)
MCV: 90.8 FL (ref 74.0–97.0)
MONOCYTES: 10 % — ABNORMAL HIGH (ref 2–9)
MPV: 9.7 FL (ref 9.2–11.8)
NEUTROPHILS: 20 % — ABNORMAL LOW (ref 42–75)
PLATELET: 168 10*3/uL (ref 135–420)
RBC: 5.22 M/uL (ref 4.70–5.50)
RDW: 13.4 % (ref 11.6–14.5)
WBC COMMENTS: REACTIVE
WBC: 3.3 10*3/uL — ABNORMAL LOW (ref 4.6–13.2)

## 2018-10-12 LAB — EKG, 12 LEAD, INITIAL
Atrial Rate: 75 {beats}/min
Calculated P Axis: 67 degrees
Calculated R Axis: 11 degrees
Calculated T Axis: -34 degrees
P-R Interval: 228 ms
Q-T Interval: 412 ms
QRS Duration: 86 ms
QTC Calculation (Bezet): 460 ms
Ventricular Rate: 75 {beats}/min

## 2018-10-12 LAB — URINALYSIS W/ RFLX MICROSCOPIC
Bilirubin, Urine: NEGATIVE
Bilirubin: NEGATIVE
Blood, Urine: NEGATIVE
Blood: NEGATIVE
Glucose, Ur: NEGATIVE mg/dL
Glucose: NEGATIVE mg/dL
Ketone: NEGATIVE mg/dL
Ketones, Urine: NEGATIVE mg/dL
Leukocyte Esterase, Urine: NEGATIVE
Leukocyte Esterase: NEGATIVE
Nitrite, Urine: NEGATIVE
Nitrites: NEGATIVE
Protein, UA: NEGATIVE mg/dL
Protein: NEGATIVE mg/dL
Specific Gravity, UA: 1.015 (ref 1.005–1.030)
Specific gravity: 1.015 (ref 1.005–1.030)
Urobilinogen, UA, POCT: 1 EU/dL (ref 0.2–1.0)
Urobilinogen: 1 EU/dL (ref 0.2–1.0)
pH (UA): 5.5 (ref 5.0–8.0)
pH, UA: 5.5 (ref 5.0–8.0)

## 2018-10-12 LAB — METABOLIC PANEL, COMPREHENSIVE
A-G Ratio: 1.2 (ref 0.8–1.7)
ALT (SGPT): 30 U/L (ref 16–61)
AST (SGOT): 22 U/L (ref 10–38)
Albumin: 3.9 g/dL (ref 3.4–5.0)
Alk. phosphatase: 57 U/L (ref 45–117)
Anion gap: 6 mmol/L (ref 3.0–18)
BUN/Creatinine ratio: 15 (ref 12–20)
BUN: 18 MG/DL (ref 7.0–18)
Bilirubin, total: 0.9 MG/DL (ref 0.2–1.0)
CO2: 26 mmol/L (ref 21–32)
Calcium: 8.3 MG/DL — ABNORMAL LOW (ref 8.5–10.1)
Chloride: 110 mmol/L (ref 100–111)
Creatinine: 1.23 MG/DL (ref 0.6–1.3)
GFR est AA: >60 mL/min/{1.73_m2} (ref >60)
GFR est non-AA: >60 mL/min/{1.73_m2} (ref >60)
Globulin: 3.2 g/dL (ref 2.0–4.0)
Glucose: 86 mg/dL (ref 74–99)
Potassium: 3.6 mmol/L (ref 3.5–5.5)
Protein, total: 7.1 g/dL (ref 6.4–8.2)
Sodium: 142 mmol/L (ref 136–145)

## 2018-10-12 LAB — ACETAMINOPHEN: Acetaminophen level: <2 ug/mL — ABNORMAL LOW (ref 10.0–30.0)

## 2018-10-12 LAB — ETHYL ALCOHOL
ALCOHOL(ETHYL),SERUM: <3 MG/DL (ref 0–3)
Ethyl Alcohol: <3 MG/DL (ref 0–3)

## 2018-10-12 LAB — SALICYLATE
Salicylate level: 4.7 MG/DL (ref 2.8–20.0)
Salicylate: 4.7 MG/DL (ref 2.8–20.0)

## 2018-10-12 LAB — CBC WITH AUTO DIFFERENTIAL
Basophils %: 0 % (ref 0–3)
Basophils Absolute: 0 10*3/uL (ref 0.0–0.1)
Eosinophils %: 2 % (ref 0–5)
Eosinophils Absolute: 0.1 10*3/uL (ref 0.0–0.4)
Hematocrit: 47.4 % (ref 36.0–48.0)
Hemoglobin: 16.9 g/dL — ABNORMAL HIGH (ref 13.0–16.0)
Lymphocytes %: 68 % — ABNORMAL HIGH (ref 20–51)
Lymphocytes Absolute: 2.2 10*3/uL (ref 0.8–3.5)
MCH: 32.4 PG (ref 24.0–34.0)
MCHC: 35.7 g/dL (ref 31.0–37.0)
MCV: 90.8 FL (ref 74.0–97.0)
MPV: 9.7 FL (ref 9.2–11.8)
Monocytes %: 10 % — ABNORMAL HIGH (ref 2–9)
Monocytes Absolute: 0.3 10*3/uL (ref 0–1.0)
Neutrophils %: 20 % — ABNORMAL LOW (ref 42–75)
Neutrophils Absolute: 0.7 10*3/uL — ABNORMAL LOW (ref 1.8–8.0)
Platelets: 168 10*3/uL (ref 135–420)
RBC: 5.22 M/uL (ref 4.70–5.50)
RDW: 13.4 % (ref 11.6–14.5)
WBC Comment: REACTIVE
WBC: 3.3 10*3/uL — ABNORMAL LOW (ref 4.6–13.2)

## 2018-10-12 LAB — COMPREHENSIVE METABOLIC PANEL
ALT: 30 U/L (ref 16–61)
AST: 22 U/L (ref 10–38)
Albumin/Globulin Ratio: 1.2 (ref 0.8–1.7)
Albumin: 3.9 g/dL (ref 3.4–5.0)
Alkaline Phosphatase: 57 U/L (ref 45–117)
Anion Gap: 6 mmol/L (ref 3.0–18)
BUN: 18 MG/DL (ref 7.0–18)
Bun/Cre Ratio: 15 (ref 12–20)
CO2: 26 mmol/L (ref 21–32)
Calcium: 8.3 MG/DL — ABNORMAL LOW (ref 8.5–10.1)
Chloride: 110 mmol/L (ref 100–111)
Creatinine: 1.23 MG/DL (ref 0.6–1.3)
EGFR IF NonAfrican American: >60 mL/min/{1.73_m2} (ref >60)
GFR African American: >60 mL/min/{1.73_m2} (ref >60)
Globulin: 3.2 g/dL (ref 2.0–4.0)
Glucose: 86 mg/dL (ref 74–99)
Potassium: 3.6 mmol/L (ref 3.5–5.5)
Sodium: 142 mmol/L (ref 136–145)
Total Bilirubin: 0.9 MG/DL (ref 0.2–1.0)
Total Protein: 7.1 g/dL (ref 6.4–8.2)

## 2018-10-12 LAB — EKG 12-LEAD
Atrial Rate: 75 {beats}/min
P Axis: 67 degrees
P-R Interval: 228 ms
Q-T Interval: 412 ms
QRS Duration: 86 ms
QTc Calculation (Bazett): 460 ms
R Axis: 11 degrees
T Axis: -34 degrees
Ventricular Rate: 75 {beats}/min

## 2018-10-12 LAB — ACETAMINOPHEN LEVEL: Acetaminophen Level: <2 ug/mL — ABNORMAL LOW (ref 10.0–30.0)

## 2018-10-12 MED ORDER — LORAZEPAM 2 MG/ML IJ SOLN
2 mg/mL | INTRAMUSCULAR | Status: DC | PRN
Start: 2018-10-12 — End: 2018-10-17

## 2018-10-12 MED ORDER — MAGNESIUM HYDROXIDE 400 MG/5 ML ORAL SUSP
400 mg/5 mL | Freq: Every day | ORAL | Status: DC | PRN
Start: 2018-10-12 — End: 2018-10-17

## 2018-10-12 MED ORDER — TRAZODONE 50 MG TAB
50 mg | Freq: Every evening | ORAL | Status: DC | PRN
Start: 2018-10-12 — End: 2018-10-17
  Administered 2018-10-13 – 2018-10-17 (×5): via ORAL

## 2018-10-12 MED ORDER — DIGOXIN 0.125 MG TAB
0.125 mg | ORAL | Status: AC
Start: 2018-10-12 — End: 2018-10-12
  Administered 2018-10-12: 17:00:00 via ORAL

## 2018-10-12 MED ORDER — METOPROLOL SUCCINATE SR 25 MG 24 HR TAB
25 mg | ORAL | Status: AC
Start: 2018-10-12 — End: 2018-10-12
  Administered 2018-10-12: 17:00:00 via ORAL

## 2018-10-12 MED ORDER — LOSARTAN 50 MG TAB
50 mg | Freq: Once | ORAL | Status: AC
Start: 2018-10-12 — End: 2018-10-12
  Administered 2018-10-12: 17:00:00 via ORAL

## 2018-10-12 MED ORDER — EPLERENONE 25 MG TAB
25 mg | ORAL | Status: AC
Start: 2018-10-12 — End: 2018-10-12
  Administered 2018-10-12: 17:00:00 via ORAL

## 2018-10-12 MED ORDER — BENZTROPINE 1 MG TAB
1 mg | Freq: Two times a day (BID) | ORAL | Status: DC | PRN
Start: 2018-10-12 — End: 2018-10-17

## 2018-10-12 MED ORDER — DIPHENHYDRAMINE HCL 50 MG/ML IJ SOLN
50 mg/mL | Freq: Two times a day (BID) | INTRAMUSCULAR | Status: DC | PRN
Start: 2018-10-12 — End: 2018-10-17

## 2018-10-12 MED ORDER — TAMSULOSIN SR 0.4 MG 24 HR CAP
0.4 mg | Freq: Once | ORAL | Status: AC
Start: 2018-10-12 — End: 2018-10-12
  Administered 2018-10-12: 17:00:00 via ORAL

## 2018-10-12 MED ORDER — FUROSEMIDE 40 MG TAB
40 mg | ORAL | Status: DC
Start: 2018-10-12 — End: 2018-10-12

## 2018-10-12 MED ORDER — VALACYCLOVIR 500 MG TAB
500 mg | Freq: Once | ORAL | Status: AC
Start: 2018-10-12 — End: 2018-10-12
  Administered 2018-10-12: 17:00:00 via ORAL

## 2018-10-12 MED ORDER — ACETAMINOPHEN 325 MG TABLET
325 mg | ORAL | Status: DC | PRN
Start: 2018-10-12 — End: 2018-10-17
  Administered 2018-10-13 – 2018-10-17 (×13): via ORAL

## 2018-10-12 MED ORDER — OLANZAPINE 5 MG TAB
5 mg | Freq: Four times a day (QID) | ORAL | Status: DC | PRN
Start: 2018-10-12 — End: 2018-10-17

## 2018-10-12 MED ORDER — ASPIRIN 81 MG CHEWABLE TAB
81 mg | ORAL | Status: AC
Start: 2018-10-12 — End: 2018-10-12
  Administered 2018-10-12: 17:00:00 via ORAL

## 2018-10-12 MED ORDER — HYDROXYZINE 25 MG TAB
25 mg | Freq: Three times a day (TID) | ORAL | Status: DC | PRN
Start: 2018-10-12 — End: 2018-10-17
  Administered 2018-10-13 – 2018-10-17 (×6): via ORAL

## 2018-10-12 MED ORDER — HALOPERIDOL LACTATE 5 MG/ML IJ SOLN
5 mg/mL | Freq: Four times a day (QID) | INTRAMUSCULAR | Status: DC | PRN
Start: 2018-10-12 — End: 2018-10-17

## 2018-10-12 MED FILL — EPLERENONE 25 MG TAB: 25 mg | ORAL | Qty: 1

## 2018-10-12 MED FILL — LANOXIN 125 MCG (0.125 MG) TABLET: 125 mcg (0. mg) | ORAL | Qty: 2

## 2018-10-12 MED FILL — TAMSULOSIN SR 0.4 MG 24 HR CAP: 0.4 mg | ORAL | Qty: 1

## 2018-10-12 MED FILL — METOPROLOL SUCCINATE SR 25 MG 24 HR TAB: 25 mg | ORAL | Qty: 4

## 2018-10-12 MED FILL — FUROSEMIDE 40 MG TAB: 40 mg | ORAL | Qty: 1

## 2018-10-12 MED FILL — VALACYCLOVIR 500 MG TAB: 500 mg | ORAL | Qty: 1

## 2018-10-12 MED FILL — LOSARTAN 50 MG TAB: 50 mg | ORAL | Qty: 1

## 2018-10-12 MED FILL — ASPIRIN 81 MG CHEWABLE TAB: 81 mg | ORAL | Qty: 1

## 2018-10-12 NOTE — ED Notes (Signed)
TRANSFER - OUT REPORT:    Verbal report given to Orlene Erm, EMT-B (name) on [facility] Wingerd  being transferred to [facility]) for routine progression of care       Report consisted of patient's Situation, Background, Assessment and   Recommendations(SBAR).     Information from the following report(s) SBAR, ED Summary and MAR was reviewed with the receiving nurse.    Lines:       Opportunity for questions and clarification was provided.      Patient transported with:   Patient's medications from home

## 2018-10-12 NOTE — Progress Notes (Signed)
Problem: Depressed Mood (Adult/Pediatric)  Goal: *STG: Verbalizes anger, guilt, and other feelings in a constructive manor  Outcome: Progressing Towards Goal  Goal: *STG: Remains safe in hospital  Outcome: Progressing Towards Goal  Goal: *STG: Complies with medication therapy  Outcome: Progressing Towards Goal

## 2018-10-12 NOTE — Behavioral Health Treatment Team (Signed)
Assumed care for the patient.  Patient was out in the milieu. Patient was cooperative with the assessment. Patient stated that he had S.I and the thoughts kept on coming and going through out the day. Patient also stated that he would jump off in front of the truck. Patient reassured and  encouraged to seek help when ever such thoughts arise.    Patient appeared sad and withdrawn. Purposeful interaction was established. Patient received PRN PO Tylenol 650 mg for pain in the leg level 8 on a scale of 0-10, PRN PO Atarax 3 times daily as needed 50 mg for anxiety and PRN Trazodone 50 mg for insomnia at 2129.    Patient slept for about 5.5 hours.

## 2018-10-12 NOTE — H&P (Signed)
[facility]  [facility] HISTORY AND PHYSICAL    Name:  Jerry [name], Jerry [name]  MR#:  [id number]  DOB:  09/17/1961  ACCOUNT #:  [id number]  ADMIT DATE:  10/12/2018    CHIEF COMPLAINT:  "I am having suicidal thoughts."    HISTORY OF PRESENT ILLNESS:  The patient is a 57 year old male who is currently admitted at [facility] on a voluntary basis.  He is a  transfer from [facility] Emergency Department.  He presented to the emergency room with suicidal thoughts with a plant to run in front of a truck.  States that he has been having thoughts of hurting himself for the last few days and he could not commit to safety.  He also started having auditory hallucinations last Friday.  Voices are telling him to kill himself and to jump in front of a truck. He states that he also hears his mother's voice. He is currently homeless after he lost his apartment in [place], [place], and had to move back to [employer].  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  He states that he has been feeling depressed and anxious because of overwhelming stressors, especially of not having money and no place to stay.  He is HIV positive.  His viral load is undetectable and has a high CD4 count.  He states he has been complaint with his medications.  He states that his suicidal ideations do come and go.  Had suicidal thoughts earlier, but denies it currently during the interview.  He denies homicidal ideation or auditory or visual hallucination.    PAST MEDICAL HISTORY:  See H and P.    PAST PSYCHIATRIC HOSPITALIZATION:  This is his first psychiatric inpatient admission.  He is currently not seeing a psychiatrist and is currently not taking any psychotropic medications.    PSYCHOSOCIAL HISTORY:  He is single.  He has no children.  He is currently receiving social security disability through checks.  He is homeless.    MENTAL STATUS  EXAMINATION:  He is alert and oriented in all spheres.  He is dressed in hospital apparel.  Mood is euthymic.  Affect is reactive.  Speech, normal rate and rhythm.  Thought process, logical and goal directed.  He continues to endorse suicidal ideation.  He currently denies suicidal ideation as described above.  He denies homicidal ideation, auditory or visual hallucination.  Memory seems intact.  Intelligence seems average.  Insight is poor.  Judgment is poor.    DIAGNOSIS:  Major depressive disorder, recurrent, severe, with psychotic features. Rule out schizoaffective disorder.    TREATMENT PLANNING:  I will continue with inpatient stay.  He will be provided with support and encouraged to attend groups.  His safety will be monitored.  His medications will be modified and assessed.  Case Management will work on discharge planning.    ASSETS AND STRENGTH:  He is willing to seek help.  He is willing to take medications.    ESTIMATED LENGTH OF STAY:  5-7 days.        Keyontae Huckeby A Shanikia Kernodle, NP      [id number]  D:  10/13/2018 17:05  T:  10/14/2018 1:10  JOB #:  [id number]

## 2018-10-12 NOTE — Progress Notes (Signed)
DC Plan: Discharge to inpt psych at [facility]  Accepting Provider: NP Ala Bent  Number for Report: [phone]  Transport can be arranged after 10am.    0831  Chart reviewed as CM on call.  Referrals placed with [facility] and [facility].    0981  Spoke with [facility] Psych and no beds available, none anticipated today    0910  Referrals faxed to [facility], [place], [place] [address], [place], [address], [place], [facility], [place], [address], [place], [facility], [place], [place], [place], [place], [place], [place], [place], [place], [facility], [place], [place]    1914  Spoke with Minerva Areola @ [address] and they do have a bed available @ [facility], ER MD made aware

## 2018-10-12 NOTE — Behavioral Health Treatment Team (Signed)
Received this 57 y/o black male via wheelchair from [name] ED. Patient is a voluntary admission. Admitted to room 323-2 for major depressive disorder.  Recently lost apartment first of October (per patient) and was residing in halfway house([facility]) in [place].  He could no longer stay at [facility] because his time was up.  Treated in [facility] ED for suicidal ideation with a plan to jump in front of a truck. Patient has a PMH: CHF, HTN, pacemaker, HIV positive. Skin assessment unremarkable with exception to round black scar noted to right lower shin. Patient states it happened a few years ago while moving furniture. Vital signs stable. Afebrile. Denies having support system. Ambulatory with unsteady gait secondary to acute pain which begin yesterday in left leg. Patient rated 9 out 10 on numerical scale 0-10.

## 2018-10-12 NOTE — ED Notes (Signed)
Patient refused Lasix and stated the dosage of Digoxin in the chart was inappropriate. Patient took his potassium, multi vitamin and Biktarvy from home. Provider aware.

## 2018-10-12 NOTE — Progress Notes (Signed)
Pharmacy Medication Reconciliation     Recommendations/Findings:     Patient stated that he was taking the following medications shown below in the PTA medication list:  Additions:  Albuterol inhaler  Eye lubricant  Norco  Olopatadine  Modifications:  Aspirin: changed from chewable to DR  Digoxin: added dose  Eplerenone: added dose  Furosemide: added dose (questionable compliance with this medication - last filled 05/02/18 for 90 days)  Hydroxyzine: modified dose and frequency  Losartan: added dose  Metoprolol XL: added dose  Potassium chloride: added dose and modified frequency (questionable compliance with this medication - last filled 06/11/18 for 90 days)  Valacyclovir: added dose and modified frequency    -Clarified PTA med list with patient and prescription bottles from [facility] [phone], [phone], [phone]) and [facility] [phone]). PTA medication list was corrected to the following:     Prior to Admission Medications   Prescriptions Last Dose Informant Patient Reported? Taking?   HYDROcodone-acetaminophen (NORCO) 5-325 mg per tablet  Self Yes Yes   Sig: Take 1 Tab by mouth every six (6) hours as needed for Pain.   albuterol (VENTOLIN HFA) 90 mcg/actuation inhaler  Self Yes Yes   Sig: Take 2 Puffs by inhalation every six (6) hours as needed for Shortness of Breath.   aspirin delayed-release 81 mg tablet 10/12/2018 Self Yes Yes   Sig: Take 81 mg by mouth daily. Indications: treatment to prevent a heart attack   bictegrav-emtricit-tenofov ala (BIKTARVY) tab tablet 10/12/2018 Self Yes Yes   Sig: Take 1 Tab by mouth daily. Indications: HIV   digoxin (LANOXIN) 0.125 mg tablet 10/12/2018 Self Yes Yes   Sig: Take 0.125 mg by mouth daily. Indications: Ventricular Rate Control in Atrial Fibrillation   eplerenone (INSPRA) 25 mg tablet 10/12/2018 Self Yes Yes   Sig: Take 12.5 mg by mouth daily. Indications: Heart Failure   furosemide (LASIX) 40 mg tablet 10/11/2018 Self Yes Yes   Sig: Take 40 mg by mouth  daily. Indications: visible water retention   hydrOXYzine HCl (ATARAX) 25 mg tablet 10/11/2018 Self Yes Yes   Sig: Take 50 mg by mouth nightly. Indications: anxious   hydroxypropyl methylcellulose (ISOPTO TEARS) 0.5 % ophthalmic solution 10/11/2018 Self Yes Yes   Sig: Administer 2 Drops to both eyes daily. Indications: dry eye   losartan (COZAAR) 50 mg tablet 10/12/2018 Self Yes Yes   Sig: Take 50 mg by mouth daily. Indications: Heart failure   metoprolol succinate (TOPROL XL) 100 mg tablet 10/12/2018 Self Yes Yes   Sig: Take 100 mg by mouth nightly. Indications: high blood pressure   olopatadine (PATANOL) 0.1 % ophthalmic solution  Self Yes Yes   Sig: Administer 1 Drop to both eyes two (2) times daily as needed for Allergies.   potassium chloride (K-DUR, KLOR-CON) 20 mEq tablet 10/12/2018 Self Yes Yes   Sig: Take 20 mEq by mouth daily (with breakfast). Indications: prevention of low potassium in the blood   sildenafil citrate (VIAGRA) 50 mg tablet  Self Yes Yes   Sig: Take 50 mg by mouth daily as needed. Indications: the inability to have an erection   tamsulosin (FLOMAX) 0.4 mg capsule 10/12/2018 Self Yes Yes   Sig: Take 0.4 mg by mouth nightly.   valACYclovir (VALTREX) 500 mg tablet 10/12/2018 Self Yes Yes   Sig: Take 500 mg by mouth daily.      Facility-Administered Medications: None       Thank you,  Basilia Jumbo, Pharm.D.  [phone]

## 2018-10-12 NOTE — Behavioral Health Treatment Team (Signed)
 TRANSFER - IN REPORT:    Verbal report received from evan Vanstrien RN on Jerry [name]  being received from [facility] ER for routine progression of care      Report consisted of patient's Situation, Background, Assessment and   Recommendations(SBAR).     Information from the following report(s) SBAR, ED Summary and Recent Results was reviewed with the receiving nurse.    Opportunity for questions and clarification was provided.      Assessment completed upon patient's arrival to unit and care assumed.

## 2018-10-12 NOTE — Progress Notes (Signed)
Problem: Depressed Mood (Adult/Pediatric)  Goal: *STG: Verbalizes anger, guilt, and other feelings in a constructive manor  Outcome: Progressing Towards Goal  Goal: *STG: Remains safe in hospital  Outcome: Progressing Towards Goal  Goal: *STG: Complies with medication therapy  Outcome: Progressing Towards Goal

## 2018-10-12 NOTE — ED Notes (Addendum)
Patient refused Lasix and stated the dosage of Digoxin in the chart was inappropriate. Patient took his potassium, multi vitamin and Biktarvy from home. Provider aware.

## 2018-10-12 NOTE — Progress Notes (Signed)
Pharmacy Medication Reconciliation     Recommendations/Findings:     Patient stated that he was taking the following medications shown below in the PTA medication list:  Additions:  Albuterol inhaler  Eye lubricant  Norco  Olopatadine  Modifications:  Aspirin: changed from chewable to DR  Digoxin: added dose  Eplerenone: added dose  Furosemide: added dose (questionable compliance with this medication - last filled 05/02/18 for 90 days)  Hydroxyzine: modified dose and frequency  Losartan: added dose  Metoprolol XL: added dose  Potassium chloride: added dose and modified frequency (questionable compliance with this medication - last filled 06/11/18 for 90 days)  Valacyclovir: added dose and modified frequency    -Clarified PTA med list with patient and prescription bottles from [facility] [phone], [phone], [phone]) and [facility] [phone]). PTA medication list was corrected to the following:     Prior to Admission Medications   Prescriptions Last Dose Informant Patient Reported? Taking?   HYDROcodone-acetaminophen (NORCO) 5-325 mg per tablet  Self Yes Yes   Sig: Take 1 Tab by mouth every six (6) hours as needed for Pain.   albuterol (VENTOLIN HFA) 90 mcg/actuation inhaler  Self Yes Yes   Sig: Take 2 Puffs by inhalation every six (6) hours as needed for Shortness of Breath.   aspirin delayed-release 81 mg tablet 10/12/2018 Self Yes Yes   Sig: Take 81 mg by mouth daily. Indications: treatment to prevent a heart attack   bictegrav-emtricit-tenofov ala (BIKTARVY) tab tablet 10/12/2018 Self Yes Yes   Sig: Take 1 Tab by mouth daily. Indications: HIV   digoxin (LANOXIN) 0.125 mg tablet 10/12/2018 Self Yes Yes   Sig: Take 0.125 mg by mouth daily. Indications: Ventricular Rate Control in Atrial Fibrillation   eplerenone (INSPRA) 25 mg tablet 10/12/2018 Self Yes Yes   Sig: Take 12.5 mg by mouth daily. Indications: Heart Failure   furosemide (LASIX) 40 mg tablet 10/11/2018 Self Yes Yes    Sig: Take 40 mg by mouth daily. Indications: visible water retention   hydrOXYzine HCl (ATARAX) 25 mg tablet 10/11/2018 Self Yes Yes   Sig: Take 50 mg by mouth nightly. Indications: anxious   hydroxypropyl methylcellulose (ISOPTO TEARS) 0.5 % ophthalmic solution 10/11/2018 Self Yes Yes   Sig: Administer 2 Drops to both eyes daily. Indications: dry eye   losartan (COZAAR) 50 mg tablet 10/12/2018 Self Yes Yes   Sig: Take 50 mg by mouth daily. Indications: Heart failure   metoprolol succinate (TOPROL XL) 100 mg tablet 10/12/2018 Self Yes Yes   Sig: Take 100 mg by mouth nightly. Indications: high blood pressure   olopatadine (PATANOL) 0.1 % ophthalmic solution  Self Yes Yes   Sig: Administer 1 Drop to both eyes two (2) times daily as needed for Allergies.   potassium chloride (K-DUR, KLOR-CON) 20 mEq tablet 10/12/2018 Self Yes Yes   Sig: Take 20 mEq by mouth daily (with breakfast). Indications: prevention of low potassium in the blood   sildenafil citrate (VIAGRA) 50 mg tablet  Self Yes Yes   Sig: Take 50 mg by mouth daily as needed. Indications: the inability to have an erection   tamsulosin (FLOMAX) 0.4 mg capsule 10/12/2018 Self Yes Yes   Sig: Take 0.4 mg by mouth nightly.   valACYclovir (VALTREX) 500 mg tablet 10/12/2018 Self Yes Yes   Sig: Take 500 mg by mouth daily.      Facility-Administered Medications: None       Thank you,  Basilia Jumbo, Pharm.D.  [phone]

## 2018-10-12 NOTE — ED Notes (Signed)
TRANSFER - OUT REPORT:    Verbal report given to Sara Safford, EMT-B (name) on Jerry [name]  being transferred to [facility](unit) for routine progression of care       Report consisted of patient???s Situation, Background, Assessment and   Recommendations(SBAR).     Information from the following report(s) SBAR, ED Summary and MAR was reviewed with the receiving nurse.    Lines:       Opportunity for questions and clarification was provided.      Patient transported with:   Patient's medications from home

## 2018-10-12 NOTE — Progress Notes (Addendum)
DC Plan: Discharge to inpt psych at [facility]  Accepting Provider: NP Stephanie Epstein  Number for Report: [phone]  Transport can be arranged after 10am.    0831  Chart reviewed as CM on call.  Referrals placed with [facility] and [facility] access line.    0842  Spoke with Teresa @ [facility] and no beds available, none anticipated today    0910  Referrals faxed to [facility], [facility], [facility], [facility], [facility], [facility], [facility], [facility], [facility]    0919  Spoke with Eric @  [facility] and they do have a bed available @ [facility], ER MD made aware

## 2018-10-12 NOTE — Behavioral Health Treatment Team (Signed)
Received this 57 y/o black male via wheelchair from [facility] ED. Patient is a voluntary admission. Admitted to room 323-2 for major depressive disorder.  Recently lost apartment first of October (per patient) and was residing in halfway house([facility]) in [place].  He could no longer stay at [facility] because his time was up.  Treated in [facility] ED for suicidal ideation with a plan to jump in front of a truck. Patient has a PMH: CHF, HTN, pacemaker, HIV positive. Skin assessment unremarkable with exception to round black scar noted to right lower shin. Patient states it happened a few years ago while moving furniture. Vital signs stable. Afebrile. Denies having support system. Ambulatory with unsteady gait secondary to acute pain which begin yesterday in left leg. Patient rated 9 out 10 on numerical scale 0-10.

## 2018-10-12 NOTE — Behavioral Health Treatment Team (Signed)
TRANSFER - IN REPORT:    Verbal report received from evan Vanstrien RN on Jerry [name]  being received from [facility] ER for routine progression of care      Report consisted of patient???s Situation, Background, Assessment and   Recommendations(SBAR).     Information from the following report(s) SBAR, ED Summary and Recent Results was reviewed with the receiving nurse.    Opportunity for questions and clarification was provided.      Assessment completed upon patient???s arrival to unit and care assumed.

## 2018-10-12 NOTE — Behavioral Health Treatment Team (Signed)
Assumed care for the patient.  Patient was out in the milieu. Patient was cooperative with the assessment. Patient stated that he had S.I and the thoughts kept on coming and going through out the day. Patient also stated that he would jump off in front of the truck. Patient reassured and  encouraged to seek help when ever such thoughts arise.    Patient appeared sad and withdrawn. Purposeful interaction was established. Patient received PRN PO Tylenol 650 mg for pain in the leg level 8 on a scale of 0-10, PRN PO Atarax 3 times daily as needed 50 mg for anxiety and PRN Trazodone 50 mg for insomnia at 2129.    Patient slept for about 5.5 hours.

## 2018-10-13 DIAGNOSIS — F333 Major depressive disorder, recurrent, severe with psychotic symptoms: Secondary | ICD-10-CM | POA: Diagnosis not present

## 2018-10-13 LAB — CBC W/O DIFF
ABSOLUTE NRBC: 0 10*3/uL (ref 0.00–0.01)
HCT: 45.1 % (ref 36.6–50.3)
HGB: 16 g/dL (ref 12.1–17.0)
MCH: 32.7 PG (ref 26.0–34.0)
MCHC: 35.5 g/dL (ref 30.0–36.5)
MCV: 92 FL (ref 80.0–99.0)
MPV: 9.5 FL (ref 8.9–12.9)
NRBC: 0 PER 100 WBC
PLATELET: 147 10*3/uL — ABNORMAL LOW (ref 150–400)
RBC: 4.9 M/uL (ref 4.10–5.70)
RDW: 12.9 % (ref 11.5–14.5)
WBC: 2.3 10*3/uL — ABNORMAL LOW (ref 4.1–11.1)

## 2018-10-13 LAB — METABOLIC PANEL, COMPREHENSIVE
A-G Ratio: 1.1 (ref 1.1–2.2)
ALT (SGPT): 22 U/L (ref 12–78)
AST (SGOT): 17 U/L (ref 15–37)
Albumin: 3.1 g/dL — ABNORMAL LOW (ref 3.5–5.0)
Alk. phosphatase: 45 U/L (ref 45–117)
Anion gap: 7 mmol/L (ref 5–15)
BUN/Creatinine ratio: 15 (ref 12–20)
BUN: 17 MG/DL (ref 6–20)
Bilirubin, total: 0.6 MG/DL (ref 0.2–1.0)
CO2: 25 mmol/L (ref 21–32)
Calcium: 8.5 MG/DL (ref 8.5–10.1)
Chloride: 108 mmol/L (ref 97–108)
Creatinine: 1.16 MG/DL (ref 0.70–1.30)
GFR est AA: >60 mL/min/{1.73_m2} (ref >60)
GFR est non-AA: >60 mL/min/{1.73_m2} (ref >60)
Globulin: 2.9 g/dL (ref 2.0–4.0)
Glucose: 94 mg/dL (ref 65–100)
Potassium: 3.9 mmol/L (ref 3.5–5.1)
Protein, total: 6 g/dL — ABNORMAL LOW (ref 6.4–8.2)
Sodium: 140 mmol/L (ref 136–145)

## 2018-10-13 LAB — LIPID PANEL
CHOL/HDL Ratio: 4.3 (ref 0.0–5.0)
Chol/HDL Ratio: 4.3 (ref 0.0–5.0)
Cholesterol, Total: 162 MG/DL (ref <200)
Cholesterol, total: 162 MG/DL (ref <200)
HDL Cholesterol: 38 MG/DL
HDL: 38 MG/DL
LDL Calculated: 86 MG/DL (ref 0–100)
LDL, calculated: 86 MG/DL (ref 0–100)
Triglyceride: 190 MG/DL — ABNORMAL HIGH (ref <150)
Triglycerides: 190 MG/DL — ABNORMAL HIGH (ref <150)
VLDL Cholesterol Calculated: 38 MG/DL
VLDL, calculated: 38 MG/DL

## 2018-10-13 LAB — TSH 3RD GENERATION
TSH: 0.29 u[IU]/mL — ABNORMAL LOW (ref 0.36–3.74)
TSH: 0.29 u[IU]/mL — ABNORMAL LOW (ref 0.36–3.74)

## 2018-10-13 LAB — COMPREHENSIVE METABOLIC PANEL
ALT: 22 U/L (ref 12–78)
AST: 17 U/L (ref 15–37)
Albumin/Globulin Ratio: 1.1 (ref 1.1–2.2)
Albumin: 3.1 g/dL — ABNORMAL LOW (ref 3.5–5.0)
Alkaline Phosphatase: 45 U/L (ref 45–117)
Anion Gap: 7 mmol/L (ref 5–15)
BUN: 17 MG/DL (ref 6–20)
Bun/Cre Ratio: 15 (ref 12–20)
CO2: 25 mmol/L (ref 21–32)
Calcium: 8.5 MG/DL (ref 8.5–10.1)
Chloride: 108 mmol/L (ref 97–108)
Creatinine: 1.16 MG/DL (ref 0.70–1.30)
EGFR IF NonAfrican American: >60 mL/min/{1.73_m2} (ref >60)
GFR African American: >60 mL/min/{1.73_m2} (ref >60)
Globulin: 2.9 g/dL (ref 2.0–4.0)
Glucose: 94 mg/dL (ref 65–100)
Potassium: 3.9 mmol/L (ref 3.5–5.1)
Sodium: 140 mmol/L (ref 136–145)
Total Bilirubin: 0.6 MG/DL (ref 0.2–1.0)
Total Protein: 6 g/dL — ABNORMAL LOW (ref 6.4–8.2)

## 2018-10-13 LAB — CBC
Hematocrit: 45.1 % (ref 36.6–50.3)
Hemoglobin: 16 g/dL (ref 12.1–17.0)
MCH: 32.7 PG (ref 26.0–34.0)
MCHC: 35.5 g/dL (ref 30.0–36.5)
MCV: 92 FL (ref 80.0–99.0)
MPV: 9.5 FL (ref 8.9–12.9)
NRBC Absolute: 0 10*3/uL (ref 0.00–0.01)
Nucleated RBCs: 0 PER 100 WBC
Platelets: 147 10*3/uL — ABNORMAL LOW (ref 150–400)
RBC: 4.9 M/uL (ref 4.10–5.70)
RDW: 12.9 % (ref 11.5–14.5)
WBC: 2.3 10*3/uL — ABNORMAL LOW (ref 4.1–11.1)

## 2018-10-13 MED ORDER — POLYETHYLENE GLYCOL 3350 17 GRAM (100 %) ORAL POWDER PACKET
17 gram | Freq: Every day | ORAL | Status: DC
Start: 2018-10-13 — End: 2018-10-17
  Administered 2018-10-13 – 2018-10-15 (×2): via ORAL

## 2018-10-13 MED ORDER — ASPIRIN 81 MG TAB, DELAYED RELEASE
81 mg | Freq: Every day | ORAL | Status: DC
Start: 2018-10-13 — End: 2018-10-17
  Administered 2018-10-13 – 2018-10-17 (×5): via ORAL

## 2018-10-13 MED ORDER — FUROSEMIDE 40 MG TAB
40 mg | Freq: Every day | ORAL | Status: DC
Start: 2018-10-13 — End: 2018-10-17
  Administered 2018-10-13 – 2018-10-17 (×4): via ORAL

## 2018-10-13 MED ORDER — DIGOXIN 0.125 MG TAB
0.125 mg | Freq: Every day | ORAL | Status: DC
Start: 2018-10-13 — End: 2018-10-17
  Administered 2018-10-13 – 2018-10-17 (×5): via ORAL

## 2018-10-13 MED ORDER — BICTEGRAVIR 50 MG-EMTRICITABINE 200 MG-TENOFOVIR ALAFENAM 25 MG TABLET
50-200-25 mg | Freq: Every day | ORAL | Status: DC
Start: 2018-10-13 — End: 2018-10-17
  Administered 2018-10-13 – 2018-10-17 (×5): via ORAL

## 2018-10-13 MED ORDER — ALBUTEROL SULFATE HFA 90 MCG/ACTUATION AEROSOL INHALER
90 mcg/actuation | Freq: Four times a day (QID) | RESPIRATORY_TRACT | Status: DC | PRN
Start: 2018-10-13 — End: 2018-10-17
  Administered 2018-10-15 – 2018-10-17 (×3): via RESPIRATORY_TRACT

## 2018-10-13 MED ORDER — LOSARTAN 25 MG TAB
25 mg | Freq: Every day | ORAL | Status: DC
Start: 2018-10-13 — End: 2018-10-16
  Administered 2018-10-13 – 2018-10-15 (×3): via ORAL

## 2018-10-13 MED ORDER — EPLERENONE 25 MG TAB
25 mg | Freq: Every day | ORAL | Status: DC
Start: 2018-10-13 — End: 2018-10-17
  Administered 2018-10-13 – 2018-10-17 (×5): via ORAL

## 2018-10-13 MED ORDER — ESCITALOPRAM 10 MG TAB
10 mg | Freq: Every day | ORAL | Status: DC
Start: 2018-10-13 — End: 2018-10-17
  Administered 2018-10-13 – 2018-10-17 (×4): via ORAL

## 2018-10-13 MED ORDER — METOPROLOL SUCCINATE SR 50 MG 24 HR TAB
50 mg | Freq: Every evening | ORAL | Status: DC
Start: 2018-10-13 — End: 2018-10-16
  Administered 2018-10-14 – 2018-10-15 (×2): via ORAL

## 2018-10-13 MED ORDER — VALACYCLOVIR 500 MG TAB
500 mg | Freq: Every day | ORAL | Status: DC
Start: 2018-10-13 — End: 2018-10-17
  Administered 2018-10-13 – 2018-10-17 (×5): via ORAL

## 2018-10-13 MED ORDER — ARTIFICIAL TEARS (DEXTRAN 70-HYPROMELLOSE) 0.1 %-0.3 % EYE DROPS
Freq: Every day | OPHTHALMIC | Status: DC
Start: 2018-10-13 — End: 2018-10-17
  Administered 2018-10-13 – 2018-10-17 (×5): via OPHTHALMIC

## 2018-10-13 MED ORDER — TAMSULOSIN SR 0.4 MG 24 HR CAP
0.4 mg | Freq: Every evening | ORAL | Status: DC
Start: 2018-10-13 — End: 2018-10-17
  Administered 2018-10-14 – 2018-10-17 (×4): via ORAL

## 2018-10-13 MED ORDER — POTASSIUM CHLORIDE SR 10 MEQ TAB
10 mEq | Freq: Every day | ORAL | Status: DC
Start: 2018-10-13 — End: 2018-10-17
  Administered 2018-10-13 – 2018-10-17 (×5): via ORAL

## 2018-10-13 MED FILL — DIGOX 125 MCG (0.125 MG) TABLET: 125 mcg (0. mg) | ORAL | Qty: 1

## 2018-10-13 MED FILL — ACETAMINOPHEN 325 MG TABLET: 325 mg | ORAL | Qty: 2

## 2018-10-13 MED FILL — POTASSIUM CHLORIDE SR 10 MEQ TAB: 10 mEq | ORAL | Qty: 2

## 2018-10-13 MED FILL — EPLERENONE 25 MG TAB: 25 mg | ORAL | Qty: 0.5

## 2018-10-13 MED FILL — HYDROXYZINE 25 MG TAB: 25 mg | ORAL | Qty: 2

## 2018-10-13 MED FILL — NATURAL BALANCE TEARS 0.1 %-0.3 % EYE DROPS: OPHTHALMIC | Qty: 15

## 2018-10-13 MED FILL — HEALTHYLAX 17 GRAM ORAL POWDER PACKET: 17 gram | ORAL | Qty: 1

## 2018-10-13 MED FILL — BIKTARVY 50 MG-200 MG-25 MG TABLET: 50-200-25 mg | ORAL | Qty: 1

## 2018-10-13 MED FILL — BAYER LOW DOSE ASPIRIN 81 MG TABLET,DELAYED RELEASE: 81 mg | ORAL | Qty: 1

## 2018-10-13 MED FILL — ESCITALOPRAM 10 MG TAB: 10 mg | ORAL | Qty: 1

## 2018-10-13 MED FILL — TRAZODONE 50 MG TAB: 50 mg | ORAL | Qty: 1

## 2018-10-13 MED FILL — FUROSEMIDE 40 MG TAB: 40 mg | ORAL | Qty: 1

## 2018-10-13 MED FILL — LOSARTAN 25 MG TAB: 25 mg | ORAL | Qty: 2

## 2018-10-13 MED FILL — VALACYCLOVIR 500 MG TAB: 500 mg | ORAL | Qty: 1

## 2018-10-13 NOTE — Progress Notes (Signed)
Problem: Suicide  Goal: *STG: Remains safe in hospital  Outcome: Progressing Towards Goal

## 2018-10-13 NOTE — H&P (Signed)
H&P by Elroy Channel,  MD at 10/13/18 1841                Author: Elroy Channel, MD  Service: Internal Medicine  Author Type: Physician       Filed: 10/13/18 1849  Date of Service: 10/13/18 1841  Status: Signed          Editor: Elroy Channel, MD (Physician)                                  Hospitalist Admission Note      NAME: Jerry [name]    DOB:  27-Jul-1961    MRN:  [id number]       Room Number:  657/84  @ [facility]        Date/Time:  10/13/2018 6:41 PM      Patient PCP: Other, Phys, MD   ______________________________________________________________________   Given the patient's current clinical presentation, I have a high level of concern for decompensation if discharged from the emergency department.  Complex decision making was performed, which includes  reviewing the patient's available past medical records, laboratory results, and x-ray films.         My assessment of this patient's clinical condition and my plan of care is as follows.      Assessment / Plan:            Active Problems:     * No active hospital problems. *      Depression with suicidal ideation   COPD not in exacerbation    Heart failure with reduced ejection fraction, s/p ICD placement   Constipation   HIV on HAART   Recurrent herpes infection on suppressive therapy   Body mass index is 29.53 kg/m??. Overweight         -Psychiatric treatment and management of health issues,Defer to psychiatrist for further management.   -Continue home meds - meds verified by pharmacist.  Ordered albuterol, aspirin, Biktarvy, digoxin, eplerenone, furosemide, potassium, artificial tears, tamsulosin, valacyclovir.   -for leg pain, tylenol PRN. Tramadol PRN if severe,defer to primary attending to determine pain level and administer as one time doses only. No standing orders in place.    -Request social worker and case management to assist with setting of a primary care physician, cardiologist, infectious disease, mental  provider in the community.   -Medically stable at this time.  We will follow up on a p.r.n. basis.   -No VTE prophylaxis indicated or warranted at this time.                 Subjective:     CHIEF COMPLAINT: SI         HISTORY OF PRESENT ILLNESS:      Jerry [name] is  a 57 y.o.  African American male  with PMH of Depression, COPD, Heart failure with reduced ejection fraction, s/p ICD placement , Constipation, HIV on HAART,  Recurrent herpes infection on suppressive therapy, Overweight  who presents to ED with c/o suicidal ideation.       Patient is originally from [facility] just moved to [place].  He does not have any providers here and requests that he be set up with a primary care physician, cardiologist, infectious disease physician.      He has had worsening anxiety, depression, suicidal ideation over the last few days and intended to jump out of a truck this afternoon.  He reports auditory hallucinations that he hears his estranged mother.  He grew up in the [place] area but after his  HIV diagnosis was estranged from his family and moved to [place].  He lost his apartment in [place] recently and moved back to [place] to locate his family but has been unsuccessful in doing so.  He recently ran out of money and has been  homeless.  [facility] has [facility].  He still reports having medication supplies and has been compliant with his medications.  He denies any current physical complaints except left thigh pain that happens occasionally.  He  also reports constipation and has not had a bowel movement in 3 days.      Other than that he denies any past [place] history.      There is a family history of MI      Smokes about 15 cigarettes a day   -Prior history of alcoholism and illicit drugs but is abstinent now.        Past Medical History:        Diagnosis  Date         ?  Anxiety       ?  Depression       ?  Heart failure ([place])       ?  HIV (human  immunodeficiency virus infection) ([place])       ?  Hypertension       ?  Pacemaker           ?  Suicidal ideation              No past surgical history on file.        Social History          Tobacco Use         ?  Smoking status:  Not on file       Substance Use Topics         ?  Alcohol use:  Not on file            No family history on file.   No Known Allergies         Prior to Admission medications             Medication  Sig  Start Date  End Date  Taking?  Authorizing Provider            HYDROcodone-acetaminophen (NORCO) 5-325 mg per tablet  Take 1 Tab by mouth every six (6) hours as needed for Pain.      Yes  Provider, Historical     hydroxypropyl methylcellulose (ISOPTO TEARS) 0.5 % ophthalmic solution  Administer 2 Drops to both eyes daily. Indications: dry eye      Yes  Provider, Historical     olopatadine (PATANOL) 0.1 % ophthalmic solution  Administer 1 Drop to both eyes two (2) times daily as needed for Allergies.      Yes  Provider, Historical     albuterol (VENTOLIN HFA) 90 mcg/actuation inhaler  Take 2 Puffs by inhalation every six (6) hours as needed for Shortness of Breath.      Yes  Provider, Historical     digoxin (LANOXIN) 0.125 mg tablet  Take 0.125 mg by mouth daily. Indications: Ventricular Rate Control in Atrial Fibrillation      Yes  Other, Phys, MD     metoprolol succinate (TOPROL XL) 100 mg tablet  Take 100  mg by mouth nightly. Indications: high blood pressure      Yes  Other, Phys, MD     eplerenone (INSPRA) 25 mg tablet  Take 12.5 mg by mouth daily. Indications: Heart Failure      Yes  Other, Phys, MD     bictegrav-emtricit-tenofov ala (BIKTARVY) tab tablet  Take 1 Tab by mouth daily. Indications: HIV      Yes  Other, Phys, MD     hydrOXYzine HCl (ATARAX) 25 mg tablet  Take 50 mg by mouth nightly. Indications: anxious      Yes  Other, Phys, MD     valACYclovir (VALTREX) 500 mg tablet  Take 500 mg by mouth daily.      Yes  Other, Phys, MD     potassium chloride (K-DUR, KLOR-CON) 20 mEq  tablet  Take 20 mEq by mouth daily (with breakfast). Indications: prevention of low potassium in the blood      Yes  Other, Phys, MD     tamsulosin (FLOMAX) 0.4 mg capsule  Take 0.4 mg by mouth nightly.      Yes  Other, Phys, MD     losartan (COZAAR) 50 mg tablet  Take 50 mg by mouth daily. Indications: Heart failure      Yes  Other, Phys, MD     sildenafil citrate (VIAGRA) 50 mg tablet  Take 50 mg by mouth daily as needed. Indications: the inability to have an erection      Yes  Other, Phys, MD     furosemide (LASIX) 40 mg tablet  Take 40 mg by mouth daily. Indications: visible water retention      Yes  Other, Phys, MD            aspirin delayed-release 81 mg tablet  Take 81 mg by mouth daily. Indications: treatment to prevent a heart attack      Yes  Other, Phys, MD           REVIEW OF SYSTEMS:      Total of 12 systems reviewed as follows:           General:  Negative for fever, chills, sweats, generalized weakness, weight loss/gain,       loss of appetite    Eyes:    Negative forblurred vision, eye pain, loss of vision, double vision   ENT:    Negative for rhinorrhea, pharyngitis    Respiratory:   Negative for cough, sputum production, SOB, DOE, wheezing, pleuritic pain    Cardiology:   Negative for chest pain, palpitations, orthopnea, PND, edema, syncope    Gastrointestinal:  Positive for constipation. Negative for abdominal pain , N/V, diarrhea, dysphagia,  bleeding    Genitourinary:  Negative for frequency, urgency, dysuria, hematuria, incontinence    Muskuloskeletal :  Negative for arthralgia, myalgia, back pain   Hematology:  Negative for easy bruising, nose or gum bleeding, lymphadenopathy    Dermatological: Negative for rash, ulceration, pruritis, color change / jaundice   Endocrine:   Negative for hot flashes or polydipsia    Neurological:  Negative for headache, dizziness, confusion, focal weakness, paresthesia,      Speech difficulties, memory loss, gait difficulty   Psychological: + anxiety depression.  Negative for Feelings of anxiety, depression, agitation        Objective:     VITALS:     Visit Vitals      BP  106/60 (BP 1 Location: Right arm, BP Patient Position: At rest)  Pulse  82     Temp  97.5 ??F (36.4 ??C)     Resp  18     Ht  '5\' 9"'  (1.753 m)     Wt  90.7 kg (200 lb)     SpO2  99%        BMI  29.53 kg/m??           PHYSICAL EXAM:      General:    Alert, cooperative, no distress, appears stated age.      HEENT: Atraumatic, anicteric sclerae, pink conjunctivae      No oral ulcers, mucosa moist, throat clear, dentition fair   Neck:  Supple, symmetrical,  no JVD, thyroid: non tender   Lungs:   Clear to auscultation bilaterally.  No Wheezing or Rhonchi. No rales.   Chest wall:  No tenderness  No Accessory muscle use.   Heart:   Regular  rhythm,  No  murmur   No edema   Abdomen:   Soft, non-tender. Not distended.  Bowel sounds normal   Extremities: No cyanosis.  No clubbing,       Skin turgor normal, Capillary refill normal, Radial dial pulse 2+   Skin:     Not pale.  Not Jaundiced  No rashes    Psych:  Good insight.  Not depressed.  Not anxious or agitated.   Neurologic: EOMs intact. No facial asymmetry. No aphasia or slurred speech. Symmetrical strength, Sensation grossly intact. Alert and oriented X 4.       ______________________________________________________________________      Care Plan discussed with:  Patient/Family      Expected  Disposition:  Home w/Family   ________________________________________________________________________   TOTAL TIME:  20 Minutes      Critical Care Provided     Minutes non procedure based              Comments             Reviewed previous records         >50% of visit spent in counseling and coordination of care    Discussion with patient and/or family and questions answered           ________________________________________________________________________   Signed: Elroy Channel, MD      Procedures: see electronic medical records for all procedures/Xrays and details  which were not copied into this note but were reviewed prior to creation of Plan.      LAB DATA REVIEWED:       Recent Results (from the past 24 hour(s))     METABOLIC PANEL, COMPREHENSIVE          Collection Time: 10/13/18  5:25 AM         Result  Value  Ref Range            Sodium  140  136 - 145 mmol/L       Potassium  3.9  3.5 - 5.1 mmol/L       Chloride  108  97 - 108 mmol/L       CO2  25  21 - 32 mmol/L       Anion gap  7  5 - 15 mmol/L       Glucose  94  65 - 100 mg/dL       BUN  17  6 - 20 MG/DL       Creatinine  1.16  0.70 - 1.30 MG/DL       BUN/Creatinine  ratio  15  12 - 20         GFR est AA  >60  >60 ml/min/1.60m       GFR est non-AA  >60  >60 ml/min/1.758m      Calcium  8.5  8.5 - 10.1 MG/DL       Bilirubin, total  0.6  0.2 - 1.0 MG/DL       ALT (SGPT)  22  12 - 78 U/L       AST (SGOT)  17  15 - 37 U/L       Alk. phosphatase  45  45 - 117 U/L       Protein, total  6.0 (L)  6.4 - 8.2 g/dL       Albumin  3.1 (L)  3.5 - 5.0 g/dL       Globulin  2.9  2.0 - 4.0 g/dL       A-G Ratio  1.1  1.1 - 2.2         CBC W/O DIFF          Collection Time: 10/13/18  5:25 AM         Result  Value  Ref Range            WBC  2.3 (L)  4.1 - 11.1 K/uL            RBC  4.90  4.10 - 5.70 M/uL       HGB  16.0  12.1 - 17.0 g/dL       HCT  45.1  36.6 - 50.3 %       MCV  92.0  80.0 - 99.0 FL       MCH  32.7  26.0 - 34.0 PG       MCHC  35.5  30.0 - 36.5 g/dL       RDW  12.9  11.5 - 14.5 %       PLATELET  147 (L)  150 - 400 K/uL       MPV  9.5  8.9 - 12.9 FL       NRBC  0.0  0 PER 100 WBC       ABSOLUTE NRBC  0.00  0.00 - 0.01 K/uL       TSH 3RD GENERATION          Collection Time: 10/13/18  5:25 AM         Result  Value  Ref Range            TSH  0.29 (L)  0.36 - 3.74 uIU/mL       LIPID PANEL          Collection Time: 10/13/18  5:25 AM         Result  Value  Ref Range            LIPID PROFILE               Cholesterol, total  162  <200 MG/DL       Triglyceride  190 (H)  <150 MG/DL       HDL Cholesterol  38  MG/DL       LDL,  calculated  86  0 - 100 MG/DL       VLDL, calculated  38  MG/DL            CHOL/HDL Ratio  4.3  0.0 - 5.0

## 2018-10-13 NOTE — Progress Notes (Signed)
Patient new to the unit, he explains that he is still having depressive feeling and SI thoughts. Little socialization with his peers and nursing staff.

## 2018-10-13 NOTE — Progress Notes (Signed)
1900- Report received from offgoing Nurse.    2143- Pt accepted all medications at this time. PRN Trazodone, Atarax, and Acetaminophen given for c/o 7/10 pain and anxiety.No adverse reactions.Pt became slightly irate in medication line r/t other pts standing in front of him to get their medications. Pt reminded that other pt were there first and reinforcement of rules, respect, and boundaries occurred. No further concerns.     2309- Pt in bed at this time, no concerns.    0200- Pt in bed at this time, no concerns. Safety checks continue.    0430- Pt remains in bed at this time.    0700- Report to be given to oncoming Nurse. Pt slept appx 9h this shift.

## 2018-10-13 NOTE — Behavioral Health Treatment Team (Signed)
0730- Received report from night shift Patient slept during the night and received prn medications for pain and sleep.  0830- Patient ate breakfast with his peer in the dayroom. No 0800 medications at this time. During assessment was revealed that patient had pain to his left leg. Breathing normal without distress but occasional wheezing. Patient reports he does use inhalers PRN. Offered but refused stating " I'm fine"  0900- Brought tylenol 650 mg and atarax 50 mg for pain and anxiety as patient had requested earlier. When arrived to room patient stated, " I don't need any medications." Patient observably still very depressed. Currently lying down in bed quietly.   1300- Patient in verbal conversation with one of the nursing staff when asked to him to leave as it was quiet time. Patient became irritated and started cursed and started making racial slurs. Patient finished his food and returned to his room. Charge nurse went to room to have a conversation with patient about his remarks and behavior.   1645- Patient has a roommate. Asked him to leave so that a medication could be given to his patient. Patient refused, stating that " I pay for this room, and you need to use an examination room"  Try to negotiate a short duration like 15 minutes but still admit that he was not going to leave. A male BHT went to room and asked him if he could leave the room temporary and patient immediately left the room and went to the dayroom.   1730-Patient continues to complain that he had to leave the room and that now his room and the bathroom is no longer sanitary. Nurse already cleaned the bathroom floor and toilet areas with sanitation wipes. All garbage bags emptied and replaced.  Charge nurse was inform and is aware of patient's concern.

## 2018-10-13 NOTE — Behavioral Health Treatment Team (Addendum)
0730- Received report from night shift Patient slept during the night and received prn medications for pain and sleep.  0830- Patient ate breakfast with his peer in the dayroom. No 0800 medications at this time. During assessment was revealed that patient had pain to his left leg. Breathing normal without distress but occasional wheezing. Patient reports he does use inhalers PRN. Offered but refused stating " I'm fine"  0900- Brought tylenol 650 mg and atarax 50 mg for pain and anxiety as patient had requested earlier. When arrived to room patient stated, " I don't need any medications." Patient observably still very depressed. Currently lying down in bed quietly.   1300- Patient in verbal conversation with one of the nursing staff when asked to him to leave as it was quiet time. Patient became irritated and started cursed and started making racial slurs. Patient finished his food and returned to his room. Charge nurse went to room to have a conversation with patient about his remarks and behavior.   1645- Patient has a roommate. Asked him to leave so that a medication could be given to his patient. Patient refused, stating that " I pay for this room, and you need to use an examination room"  Try to negotiate a short duration like 15 minutes but still admit that he was not going to leave. A male BHT went to room and asked him if he could leave the room temporary and patient immediately left the room and went to the dayroom.   1730-Patient continues to complain that he had to leave the room and that now his room and the bathroom is no longer sanitary. Nurse already cleaned the bathroom floor and toilet areas with sanitation wipes. All garbage bags emptied and replaced.  Charge nurse was inform and is aware of patient's concern.

## 2018-10-13 NOTE — H&P (Signed)
Hospitalist Admission Note    NAME: Jerry [name]   DOB:  1961/03/25   MRN:  [id number]   Room Number: 440/10  @ [facility]     Date/Time:  10/13/2018 6:41 PM    Patient PCP: Other, Phys, MD  ______________________________________________________________________  Given the patient's current clinical presentation, I have a high level of concern for decompensation if discharged from the emergency department.  Complex decision making was performed, which includes reviewing the patient's available past medical records, laboratory results, and x-ray films.       My assessment of this patient's clinical condition and my plan of care is as follows.    Assessment / Plan:       Active Problems:    * No active hospital problems. *    Depression with suicidal ideation  COPD not in exacerbation   Heart failure with reduced ejection fraction, s/p ICD placement  Constipation  HIV on HAART  Recurrent herpes infection on suppressive therapy  Body mass index is 29.53 kg/m??. Overweight      -Psychiatric treatment and management of health issues,Defer to psychiatrist for further management.  -Continue home meds - meds verified by pharmacist.  Ordered albuterol, aspirin, Biktarvy, digoxin, eplerenone, furosemide, potassium, artificial tears, tamsulosin, valacyclovir.  ???for leg pain, tylenol PRN. Tramadol PRN if severe,defer to primary attending to determine pain level and administer as one time doses only. No standing orders in place.   -Request social worker and case management to assist with setting of a primary care physician, cardiologist, infectious disease, mental provider in the community.  -Medically stable at this time.  We will follow up on a p.r.n. basis.  -No VTE prophylaxis indicated or warranted at this time.          Subjective:   CHIEF COMPLAINT: SI      HISTORY OF PRESENT ILLNESS:     Jerry [name] is a 57 y.o.  African American male with PMH of Depression,  COPD, Heart failure with reduced ejection fraction, s/p ICD placement, Constipation, HIV on HAART, Recurrent herpes infection on suppressive therapy, Overweight who presents to ED with c/o suicidal ideation.     Patient is originally from [facility] just moved to [place].  He does not have any providers here and requests that he be set up with a primary care physician, cardiologist, infectious disease physician.    He has had worsening anxiety, depression, suicidal ideation over the last few days and intended to jump out of a truck this afternoon.  He reports auditory hallucinations that he hears his estranged mother.  He grew up in the [place] area but after his HIV diagnosis was estranged from his family and moved to [place].  He lost his apartment in [place] recently and moved back to [place] to locate his family but has been unsuccessful in doing so.  He recently ran out of money and has been homeless.  [facility] [place] [facility].  He still reports having medication supplies and has been compliant with his medications.  He denies any current physical complaints except left thigh pain that happens occasionally.  He also reports constipation and has not had a bowel movement in 3 days.    Other than that he denies any past [place] history.    There is a family history of MI    Smokes about 15 cigarettes a day  ???Prior history of alcoholism and illicit drugs but is abstinent now.  Past Medical History:   Diagnosis Date   ??? Anxiety    ??? Depression    ??? Heart failure ([place])    ??? HIV (human immunodeficiency virus infection) ([place])    ??? Hypertension    ??? Pacemaker    ??? Suicidal ideation         No past surgical history on file.    Social History     Tobacco Use   ??? Smoking status: Not on file   Substance Use Topics   ??? Alcohol use: Not on file        No family history on file.  No Known Allergies     Prior to Admission medications     Medication Sig Start Date End Date Taking? Authorizing Provider   HYDROcodone-acetaminophen (NORCO) 5-325 mg per tablet Take 1 Tab by mouth every six (6) hours as needed for Pain.   Yes Provider, Historical   hydroxypropyl methylcellulose (ISOPTO TEARS) 0.5 % ophthalmic solution Administer 2 Drops to both eyes daily. Indications: dry eye   Yes Provider, Historical   olopatadine (PATANOL) 0.1 % ophthalmic solution Administer 1 Drop to both eyes two (2) times daily as needed for Allergies.   Yes Provider, Historical   albuterol (VENTOLIN HFA) 90 mcg/actuation inhaler Take 2 Puffs by inhalation every six (6) hours as needed for Shortness of Breath.   Yes Provider, Historical   digoxin (LANOXIN) 0.125 mg tablet Take 0.125 mg by mouth daily. Indications: Ventricular Rate Control in Atrial Fibrillation   Yes Other, Phys, MD   metoprolol succinate (TOPROL XL) 100 mg tablet Take 100 mg by mouth nightly. Indications: high blood pressure   Yes Other, Phys, MD   eplerenone (INSPRA) 25 mg tablet Take 12.5 mg by mouth daily. Indications: Heart Failure   Yes Other, Phys, MD   bictegrav-emtricit-tenofov ala (BIKTARVY) tab tablet Take 1 Tab by mouth daily. Indications: HIV   Yes Other, Phys, MD   hydrOXYzine HCl (ATARAX) 25 mg tablet Take 50 mg by mouth nightly. Indications: anxious   Yes Other, Phys, MD   valACYclovir (VALTREX) 500 mg tablet Take 500 mg by mouth daily.   Yes Other, Phys, MD   potassium chloride (K-DUR, KLOR-CON) 20 mEq tablet Take 20 mEq by mouth daily (with breakfast). Indications: prevention of low potassium in the blood   Yes Other, Phys, MD   tamsulosin (FLOMAX) 0.4 mg capsule Take 0.4 mg by mouth nightly.   Yes Other, Phys, MD   losartan (COZAAR) 50 mg tablet Take 50 mg by mouth daily. Indications: Heart failure   Yes Other, Phys, MD   sildenafil citrate (VIAGRA) 50 mg tablet Take 50 mg by mouth daily as needed. Indications: the inability to have an erection   Yes Other, Phys, MD    furosemide (LASIX) 40 mg tablet Take 40 mg by mouth daily. Indications: visible water retention   Yes Other, Phys, MD   aspirin delayed-release 81 mg tablet Take 81 mg by mouth daily. Indications: treatment to prevent a heart attack   Yes Other, Phys, MD       REVIEW OF SYSTEMS:     Total of 12 systems reviewed as follows:         General:  Negative for fever, chills, sweats, generalized weakness, weight loss/gain,      loss of appetite   Eyes:    Negative forblurred vision, eye pain, loss of vision, double vision  ENT:    Negative for rhinorrhea, pharyngitis   Respiratory:  Negative for cough, sputum production, SOB, DOE, wheezing, pleuritic pain   Cardiology:   Negative for chest pain, palpitations, orthopnea, PND, edema, syncope   Gastrointestinal:  Positive for constipation. Negative for abdominal pain , N/V, diarrhea, dysphagia,  bleeding   Genitourinary:  Negative for frequency, urgency, dysuria, hematuria, incontinence   Muskuloskeletal :  Negative for arthralgia, myalgia, back pain  Hematology:  Negative for easy bruising, nose or gum bleeding, lymphadenopathy   Dermatological: Negative for rash, ulceration, pruritis, color change / jaundice  Endocrine:   Negative for hot flashes or polydipsia   Neurological:  Negative for headache, dizziness, confusion, focal weakness, paresthesia,     Speech difficulties, memory loss, gait difficulty  Psychological: + anxiety depression. Negative for Feelings of anxiety, depression, agitation    Objective:   VITALS:    Visit Vitals  BP 106/60 (BP 1 Location: Right arm, BP Patient Position: At rest)   Pulse 82   Temp 97.5 ??F (36.4 ??C)   Resp 18   Ht '5\' 9"'$  (1.753 m)   Wt 90.7 kg (200 lb)   SpO2 99%   BMI 29.53 kg/m??       PHYSICAL EXAM:    General:    Alert, cooperative, no distress, appears stated age.     HEENT: Atraumatic, anicteric sclerae, pink conjunctivae     No oral ulcers, mucosa moist, throat clear, dentition fair   Neck:  Supple, symmetrical,  no JVD, thyroid: non tender  Lungs:   Clear to auscultation bilaterally.  No Wheezing or Rhonchi. No rales.  Chest wall:  No tenderness  No Accessory muscle use.  Heart:   Regular  rhythm,  No  murmur   No edema  Abdomen:   Soft, non-tender. Not distended.  Bowel sounds normal  Extremities: No cyanosis.  No clubbing,      Skin turgor normal, Capillary refill normal, Radial dial pulse 2+  Skin:     Not pale.  Not Jaundiced  No rashes   Psych:  Good insight.  Not depressed.  Not anxious or agitated.  Neurologic: EOMs intact. No facial asymmetry. No aphasia or slurred speech. Symmetrical strength, Sensation grossly intact. Alert and oriented X 4.     ______________________________________________________________________    Care Plan discussed with:  Patient/Family    Expected  Disposition:  Home w/Family  ________________________________________________________________________  TOTAL TIME:  20 Minutes    Critical Care Provided     Minutes non procedure based      Comments     Reviewed previous records   >50% of visit spent in counseling and coordination of care  Discussion with patient and/or family and questions answered       ________________________________________________________________________  Signed: Elroy Channel, MD    Procedures: see electronic medical records for all procedures/Xrays and details which were not copied into this note but were reviewed prior to creation of Plan.    LAB DATA REVIEWED:    Recent Results (from the past 24 hour(s))   METABOLIC PANEL, COMPREHENSIVE    Collection Time: 10/13/18  5:25 AM   Result Value Ref Range    Sodium 140 136 - 145 mmol/L    Potassium 3.9 3.5 - 5.1 mmol/L    Chloride 108 97 - 108 mmol/L    CO2 25 21 - 32 mmol/L    Anion gap 7 5 - 15 mmol/L    Glucose 94 65 - 100 mg/dL    BUN 17 6 - 20 MG/DL    Creatinine 1.16  0.70 - 1.30 MG/DL    BUN/Creatinine ratio 15 12 - 20      GFR est AA >60 >60 ml/min/1.31m     GFR est non-AA >60 >60 ml/min/1.762m   Calcium 8.5 8.5 - 10.1 MG/DL    Bilirubin, total 0.6 0.2 - 1.0 MG/DL    ALT (SGPT) 22 12 - 78 U/L    AST (SGOT) 17 15 - 37 U/L    Alk. phosphatase 45 45 - 117 U/L    Protein, total 6.0 (L) 6.4 - 8.2 g/dL    Albumin 3.1 (L) 3.5 - 5.0 g/dL    Globulin 2.9 2.0 - 4.0 g/dL    A-G Ratio 1.1 1.1 - 2.2     CBC W/O DIFF    Collection Time: 10/13/18  5:25 AM   Result Value Ref Range    WBC 2.3 (L) 4.1 - 11.1 K/uL    RBC 4.90 4.10 - 5.70 M/uL    HGB 16.0 12.1 - 17.0 g/dL    HCT 45.1 36.6 - 50.3 %    MCV 92.0 80.0 - 99.0 FL    MCH 32.7 26.0 - 34.0 PG    MCHC 35.5 30.0 - 36.5 g/dL    RDW 12.9 11.5 - 14.5 %    PLATELET 147 (L) 150 - 400 K/uL    MPV 9.5 8.9 - 12.9 FL    NRBC 0.0 0 PER 100 WBC    ABSOLUTE NRBC 0.00 0.00 - 0.01 K/uL   TSH 3RD GENERATION    Collection Time: 10/13/18  5:25 AM   Result Value Ref Range    TSH 0.29 (L) 0.36 - 3.74 uIU/mL   LIPID PANEL    Collection Time: 10/13/18  5:25 AM   Result Value Ref Range    LIPID PROFILE          Cholesterol, total 162 <200 MG/DL    Triglyceride 190 (H) <150 MG/DL    HDL Cholesterol 38 MG/DL    LDL, calculated 86 0 - 100 MG/DL    VLDL, calculated 38 MG/DL    CHOL/HDL Ratio 4.3 0.0 - 5.0

## 2018-10-13 NOTE — Progress Notes (Addendum)
1900- Report received from offgoing Nurse.    2143- Pt accepted all medications at this time. PRN Trazodone, Atarax, and Acetaminophen given for c/o 7/10 pain and anxiety.No adverse reactions.Pt became slightly irate in medication line r/t other pts standing in front of him to get their medications. Pt reminded that other pt were there first and reinforcement of rules, respect, and boundaries occurred. No further concerns.     2309- Pt in bed at this time, no concerns.    0200- Pt in bed at this time, no concerns. Safety checks continue.    0430- Pt remains in bed at this time.    0700- Report to be given to oncoming Nurse. Pt slept appx 9h this shift.

## 2018-10-13 NOTE — H&P (Signed)
[facility]  [facility] HISTORY AND PHYSICAL    Name:  Jerry [name], Jerry [name]  MR#:  [id number]  DOB:  [birth year]  ACCOUNT #:  [id number]  ADMIT DATE:  10/12/2018    CHIEF COMPLAINT:  "I am having suicidal thoughts."    HISTORY OF PRESENT ILLNESS:  The patient is a 57 year old male who is currently admitted at [facility] on a voluntary basis.  He is a  transfer from [facility] Emergency Department.  He presented to the emergency room with suicidal thoughts with a plant to run in front of a truck.  States that he has been having thoughts of hurting himself for the last few days and he could not commit to safety.  He also started having auditory hallucinations last Friday.  Voices are telling him to kill himself and to jump in front of a truck. He states that he also hears his mother's voice. He is currently homeless after he lost his apartment in [place], [place], and had to move back to [place].  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  He states that he has been feeling depressed and anxious because of overwhelming stressors, especially of not having money and no place to stay.  He is HIV positive.  His viral load is undetectable and has a high CD4 count.  He states he has been complaint with his medications.  He states that his suicidal ideations do come and go.  Had suicidal thoughts earlier, but denies it currently during the interview.  He denies homicidal ideation or auditory or visual hallucination.    PAST MEDICAL HISTORY:  See H and P.    PAST PSYCHIATRIC HOSPITALIZATION:  This is his first psychiatric inpatient admission.  He is currently not seeing a psychiatrist and is currently not taking any psychotropic medications.    PSYCHOSOCIAL HISTORY:  He is single.  He has no children.  He is currently receiving social security disability through checks.  He is homeless.     MENTAL STATUS EXAMINATION:  He is alert and oriented in all spheres.  He is dressed in hospital apparel.  Mood is euthymic.  Affect is reactive.  Speech, normal rate and rhythm.  Thought process, logical and goal directed.  He continues to endorse suicidal ideation.  He currently denies suicidal ideation as described above.  He denies homicidal ideation, auditory or visual hallucination.  Memory seems intact.  Intelligence seems average.  Insight is poor.  Judgment is poor.    DIAGNOSIS:  Major depressive disorder, recurrent, severe, with psychotic features. Rule out schizoaffective disorder.    TREATMENT PLANNING:  I will continue with inpatient stay.  He will be provided with support and encouraged to attend groups.  His safety will be monitored.  His medications will be modified and assessed.  Case Management will work on discharge planning.    ASSETS AND STRENGTH:  He is willing to seek help.  He is willing to take medications.    ESTIMATED LENGTH OF STAY:  5-7 days.        Jerry Vitale A Ralphine Hinks, NP      [id number]  D:  10/13/2018 17:05  T:  10/14/2018 1:10  JOB #:  [id number]

## 2018-10-13 NOTE — Progress Notes (Signed)
Patient new to the unit, he explains that he is still having depressive feeling and SI thoughts. Little socialization with his peers and nursing staff.

## 2018-10-14 DIAGNOSIS — F333 Major depressive disorder, recurrent, severe with psychotic symptoms: Secondary | ICD-10-CM | POA: Diagnosis not present

## 2018-10-14 MED FILL — ESCITALOPRAM 10 MG TAB: 10 mg | ORAL | Qty: 1

## 2018-10-14 MED FILL — METOPROLOL SUCCINATE SR 50 MG 24 HR TAB: 50 mg | ORAL | Qty: 2

## 2018-10-14 MED FILL — ACETAMINOPHEN 325 MG TABLET: 325 mg | ORAL | Qty: 2

## 2018-10-14 MED FILL — HYDROXYZINE 25 MG TAB: 25 mg | ORAL | Qty: 2

## 2018-10-14 MED FILL — POTASSIUM CHLORIDE SR 10 MEQ TAB: 10 mEq | ORAL | Qty: 2

## 2018-10-14 MED FILL — EPLERENONE 25 MG TAB: 25 mg | ORAL | Qty: 0.5

## 2018-10-14 MED FILL — BIKTARVY 50 MG-200 MG-25 MG TABLET: 50-200-25 mg | ORAL | Qty: 1

## 2018-10-14 MED FILL — BAYER LOW DOSE ASPIRIN 81 MG TABLET,DELAYED RELEASE: 81 mg | ORAL | Qty: 1

## 2018-10-14 MED FILL — FUROSEMIDE 40 MG TAB: 40 mg | ORAL | Qty: 1

## 2018-10-14 MED FILL — VALACYCLOVIR 500 MG TAB: 500 mg | ORAL | Qty: 1

## 2018-10-14 MED FILL — DIGOX 125 MCG (0.125 MG) TABLET: 125 mcg (0. mg) | ORAL | Qty: 1

## 2018-10-14 MED FILL — LOSARTAN 25 MG TAB: 25 mg | ORAL | Qty: 2

## 2018-10-14 MED FILL — TRAZODONE 50 MG TAB: 50 mg | ORAL | Qty: 1

## 2018-10-14 MED FILL — TAMSULOSIN SR 0.4 MG 24 HR CAP: 0.4 mg | ORAL | Qty: 1

## 2018-10-14 NOTE — Progress Notes (Signed)
Problem: Depressed Mood (Adult/Pediatric)  Goal: *STG: Participates in treatment plan  Outcome: Progressing Towards Goal  Goal: *STG: Remains safe in hospital  Outcome: Progressing Towards Goal  Goal: *STG: Complies with medication therapy  Outcome: Progressing Towards Goal     Problem: Suicide  Goal: *STG: Remains safe in hospital  Outcome: Progressing Towards Goal     Problem: Falls - Risk of  Goal: *Absence of Falls  Description  Document Schmid Fall Risk and appropriate interventions in the flowsheet.  Outcome: Progressing Towards Goal  Note:   Fall Risk Interventions:

## 2018-10-14 NOTE — Behavioral Health Treatment Team (Signed)
PSYCHOSOCIAL ASSESSMENT  :Patient identifying info:  Jerry [name] is a 57 y.o., male admitted 10/12/2018  3:30 PM     Presenting problem and precipitating factors: Patient was transferred to [name] from [facility] ED for SI with a plan to jump in front of a truck.  Pt reported he recently lost his apartment and has been living at [facility], but his time there had expired.  Pt presents as hopeless, helpless, and irritable.  Unclear what Pt plans to do to regain stability after discharge from the hospital.    Mental status assessment: Alert, oriented, depressed, cooperative    Strengths: Seeks help    Collateral information: None    Current psychiatric /substance abuse providers and contact info: To be linked    Previous psychiatric/substance abuse providers and response to treatment: None    Family history of mental illness or substance abuse: None indicated    Substance abuse history:  None  Social History     Tobacco Use   . Smoking status: Not on file   Substance Use Topics   . Alcohol use: Not on file       History of biomedical complications associated with substance abuse: Denies    Patient's current acceptance of treatment or motivation for change: Fair    Family constellation: Unknown    Is significant other involved?  No      Describe support system: None    Describe living arrangements and home environment: Patient states he is currently homeless.  He states he just recently lost his apartment in [place], [place].  Was staying at [facility], but his time there ran out.    Health issues:   Hospital Problems  Never Reviewed          Codes Class Noted POA    Major depressive disorder ICD-10-CM: F32.9  ICD-9-CM: 296.20  10/13/2018 Unknown              Trauma history: Diagnosed with HIV    Legal issues: None indicated    History of military service: No    Financial status:     Religious/cultural factors: Baptist    Education/work history: Currently unemployed    Have you  been licensed as a Clinical cytogeneticist (current or expired): No    Leisure and recreation preferences: None    Describe coping skills: Limited    Macy Mis  10/14/2018

## 2018-10-14 NOTE — Progress Notes (Signed)
1900- Report received from offgoing Nurse.    2100- Pt accepted all medications this shift. PRN Trazodone and Atarax given for c/o 7/10 anxiety. No adverse reactions.    2300- PRN Tylenol given for c/o 8/10 Lt back pain. No adverse reactions.    0100- Pt remains in bed at this time, no concerns.    0300- Pt in bed at this time, appears to be sleeping, no concerns.     0500- Pt in bed at this time, no concerns. Safety checks continue.    0600- Pt allowed nurse to obtain specimen, from Lt /Rt ac r/t needing additional tubes. Pt tolerated well. Pt was cooperative and willing without issue. Specimens sent to lab. Consent from testing received and placed on pt chart.     0700-- Report to be given to oncoming Nurse. Pt slept appx 7h this shift.

## 2018-10-14 NOTE — Progress Notes (Signed)
Problem: Depressed Mood (Adult/Pediatric)  Goal: *STG: Participates in treatment plan  Outcome: Progressing Towards Goal  Goal: *STG: Participates in 1:1 therapy sessions  Outcome: Progressing Towards Goal  Goal: *STG: Verbalizes anger, guilt, and other feelings in a constructive manor  Outcome: Progressing Towards Goal  Goal: *STG: Attends activities and groups  Outcome: Progressing Towards Goal  Goal: *STG: Demonstrates reduction in symptoms and increase in insight into coping skills/future focused  Outcome: Progressing Towards Goal  Goal: *STG: Remains safe in hospital  Outcome: Progressing Towards Goal  Goal: *STG: Complies with medication therapy  Outcome: Progressing Towards Goal  Goal: *LTG: Returns to previous level of functioning and participates with after care plan  Outcome: Progressing Towards Goal  Goal: *LTG: Understands illness and can identify signs of relapse  Outcome: Progressing Towards Goal  Goal: Interventions  Outcome: Progressing Towards Goal     Problem: Suicide  Goal: *STG: Remains safe in hospital  Outcome: Progressing Towards Goal     Problem: Falls - Risk of  Goal: *Absence of Falls  Description  Document Schmid Fall Risk and appropriate interventions in the flowsheet.  Outcome: Progressing Towards Goal  Note:   Fall Risk Interventions:                                Problem: Patient Education: Go to Patient Education Activity  Goal: Patient/Family Education  Outcome: Progressing Towards Goal

## 2018-10-14 NOTE — Behavioral Health Treatment Team (Signed)
0800 pt is visible on the unit. Calm and cooperative. Ate breakfast.     1000 complaint of pain and was given tylenol 650mg  po. Pt refused scheduled furosemide stating he didn't want to get up out of the bed to run to bathroom all day. encouraged to attend groups. Denies si/hi/a/v hall. States he has depression. Pt is isolative to self.     1200 pt ate lunch. voiced no complaints.    1400 pt is resting in room.     1600 pt is resting in room.     1800 pt visible on the unit. Complaint of pain given tylenol. MD ordered lab draw but pt was stuck twice and no blood results. pt states nurse can try again in the morning.  MD called gave order to reschedule for the morning.

## 2018-10-14 NOTE — Behavioral Health Treatment Team (Signed)
BH  Notes by Lorie Phenix, MD at 10/14/18 0901                Author: Lorie Phenix, MD  Service: PSYCHIATRY  Author Type: Physician       Filed: 10/14/18 1621  Date of Service: 10/14/18 0901  Status: Signed          Editor: Lorie Phenix, MD (Physician)                               Psychiatric Progress Note          Patient: Jerry [name]  MRN: [id number]   SSN: [id number]          Date of Birth: [birth year]   Age: 57 y.o.   Sex: male         Admit Date: 10/12/2018     LOS: 2 days         Subjective:        Jerry [name]  reports feeling depressed still and moods are down.  Continues to have thoughts of running in front of a bus.  Denies  HI/AH/VH.  No aggression or violence.  Appropriately interactive and aware. Tolerating medications well.  Eating well and sleeping well.  Was in [place] but is thinking of relocating to [place]. Complains of low back pain and is HIV(+).  No supports  anymore.        Objective:          Vitals:             10/13/18 1152  10/13/18 1943  10/14/18 0156  10/14/18 0831           BP:    110/70  110/70  121/78     Pulse:  82  81  81  70     Resp:    18  18  16      Temp:    98.3 ??F (36.8 ??C)  98.3 ??F (36.8 ??C)  97.4 ??F (36.3 ??C)     SpO2:    100%    98%     Weight:                   Height:                    Mental Status Exam:       Sensorium   oriented to time, place and person        Orientation  situation        Relations  cooperative        Eye Contact  appropriate     Appearance:   age appropriate        Motor Behavior:   within normal limits        Speech:   normal volume and non-pressured        Vocabulary  average        Thought Process:  goal directed     Thought Content  free of delusions and free of hallucinations        Suicidal ideations  none        Homicidal ideations  none     Mood:   depressed     Affect:   constricted        Memory recent   adequate        Memory remote:  adequate        Concentration:   adequate         Abstraction:   abstract     Insight:   fair     Reliability  fair        Judgment:   limited           MEDICATIONS:     Current Facility-Administered Medications          Medication  Dose  Route  Frequency           ?  digoxin (LANOXIN) tablet 0.125 mg   0.125 mg  Oral  DAILY     ?  bictegrav-emtricit-tenofov ala (BIKTARVY) 50-200-25 mg tablet 1 Tab   1 Tab  Oral  DAILY     ?  aspirin delayed-release tablet 81 mg   81 mg  Oral  DAILY     ?  albuterol (PROVENTIL HFA, VENTOLIN HFA, PROAIR HFA) inhaler 2 Puff   2 Puff  Inhalation  Q6H PRN     ?  eplerenone (INSPRA) tablet 12.5 mg   12.5 mg  Oral  DAILY     ?  furosemide (LASIX) tablet 40 mg   40 mg  Oral  DAILY     ?  artificial tears (dextran 70-hypromellose) (NATURAL BALANCE) 0.1-0.3 % ophthalmic solution 2 Drop   2 Drop  Both Eyes  DAILY     ?  losartan (COZAAR) tablet 50 mg   50 mg  Oral  DAILY     ?  metoprolol succinate (TOPROL-XL) XL tablet 100 mg   100 mg  Oral  QHS     ?  potassium chloride SR (KLOR-CON 10) tablet 20 mEq   20 mEq  Oral  DAILY WITH BREAKFAST     ?  tamsulosin (FLOMAX) capsule 0.4 mg   0.4 mg  Oral  QHS     ?  valACYclovir (VALTREX) tablet 500 mg   500 mg  Oral  DAILY     ?  escitalopram oxalate (LEXAPRO) tablet 10 mg   10 mg  Oral  DAILY     ?  polyethylene glycol (MIRALAX) packet 17 g   17 g  Oral  DAILY     ?  OLANZapine (ZyPREXA) tablet 5 mg   5 mg  Oral  Q6H PRN     ?  haloperidol lactate (HALDOL) injection 5 mg   5 mg  IntraMUSCular  Q6H PRN     ?  benztropine (COGENTIN) tablet 1 mg   1 mg  Oral  BID PRN     ?  diphenhydrAMINE (BENADRYL) injection 50 mg   50 mg  IntraMUSCular  BID PRN     ?  hydrOXYzine HCl (ATARAX) tablet 50 mg   50 mg  Oral  TID PRN     ?  LORazepam (ATIVAN) injection 1 mg   1 mg  IntraMUSCular  Q4H PRN     ?  traZODone (DESYREL) tablet 50 mg   50 mg  Oral  QHS PRN     ?  acetaminophen (TYLENOL) tablet 650 mg   650 mg  Oral  Q4H PRN           ?  magnesium hydroxide (MILK OF MAGNESIA) 400 mg/5 mL oral suspension 30 mL    30 mL  Oral  DAILY PRN         DISCUSSION:    the risks and benefits of the proposed medication  patient given opportunity to ask questions      Lab/Data Review:   All lab results for the last 24 hours reviewed.       Low platelets and WBC        Assessment:        Active Problems:     Major depressive disorder (10/13/2018)              Plan:        Continue current care   Viral load and CD4 count   Disposition planning with social work         Signed By:  Lorie Phenix, MD           October 14, 2018

## 2018-10-14 NOTE — Behavioral Health Treatment Team (Signed)
PSYCHOSOCIAL ASSESSMENT  :Patient identifying info:  Jerry [name] is a 57 y.o., male admitted 10/12/2018  3:30 PM     Presenting problem and precipitating factors: Patient was transferred to [facility] from [facility] ED for SI with a plan to jump in front of a truck.  Pt reported he recently lost his apartment and has been living at [facility], but his time there had expired.  Pt presents as hopeless, helpless, and irritable.  Unclear what Pt plans to do to regain stability after discharge from the hospital.    Mental status assessment: Alert, oriented, depressed, cooperative    Strengths: Seeks help    Collateral information: None    Current psychiatric /substance abuse providers and contact info: To be linked    Previous psychiatric/substance abuse providers and response to treatment: None    Family history of mental illness or substance abuse: None indicated    Substance abuse history:  None  Social History     Tobacco Use   ??? Smoking status: Not on file   Substance Use Topics   ??? Alcohol use: Not on file       History of biomedical complications associated with substance abuse: Denies    Patient's current acceptance of treatment or motivation for change: Fair    Family constellation: Unknown    Is significant other involved?  No      Describe support system: None    Describe living arrangements and home environment: Patient states he is currently homeless.  He states he just recently lost his apartment in [place], [place].  Was staying at [facility], but his time there ran out.    Health issues:   Hospital Problems  Never Reviewed          Codes Class Noted POA    Major depressive disorder ICD-10-CM: F32.9  ICD-9-CM: 296.20  10/13/2018 Unknown              Trauma history: Diagnosed with HIV    Legal issues: None indicated    History of military service: No    Financial status:     Religious/cultural factors: Baptist    Education/work history: Currently unemployed     Have you been licensed as a Clinical cytogeneticist (current or expired): No    Leisure and recreation preferences: None    Describe coping skills: Limited    Macy Mis  10/14/2018

## 2018-10-14 NOTE — Progress Notes (Addendum)
1900- Report received from offgoing Nurse.    2100- Pt accepted all medications this shift. PRN Trazodone and Atarax given for c/o 7/10 anxiety. No adverse reactions.    2300- PRN Tylenol given for c/o 8/10 Lt back pain. No adverse reactions.    0100- Pt remains in bed at this time, no concerns.    0300- Pt in bed at this time, appears to be sleeping, no concerns.     0500- Pt in bed at this time, no concerns. Safety checks continue.    0600- Pt allowed nurse to obtain specimen, from Lt /Rt ac r/t needing additional tubes. Pt tolerated well. Pt was cooperative and willing without issue. Specimens sent to lab. Consent from testing received and placed on pt chart.     0700-- Report to be given to oncoming Nurse. Pt slept appx 7h this shift.

## 2018-10-14 NOTE — Behavioral Health Treatment Team (Signed)
Psychiatric Progress Note    Patient: Jerry [name] MRN: [id number]  SSN: [id number]    Date of Birth: Mar 04, 1961  Age: 57 y.o.  Sex: male      Admit Date: 10/12/2018    LOS: 2 days     Subjective:     Jerry [name]  reports feeling depressed still and moods are down.  Continues to have thoughts of running in front of a bus.  Denies HI/AH/VH.  No aggression or violence.  Appropriately interactive and aware. Tolerating medications well.  Eating well and sleeping well.  Was in [place] but is thinking of relocating to [place]. Complains of low back pain and is HIV(+).  No supports anymore.    Objective:     Vitals:    10/13/18 1152 10/13/18 1943 10/14/18 0156 10/14/18 0831   BP:  110/70 110/70 121/78   Pulse: 82 81 81 70   Resp:  18 18 16    Temp:  98.3 ??F (36.8 ??C) 98.3 ??F (36.8 ??C) 97.4 ??F (36.3 ??C)   SpO2:  100%  98%   Weight:       Height:            Mental Status Exam:   Sensorium  oriented to time, place and person   Orientation situation   Relations cooperative   Eye Contact appropriate   Appearance:  age appropriate   Motor Behavior:  within normal limits   Speech:  normal volume and non-pressured   Vocabulary average   Thought Process: goal directed   Thought Content free of delusions and free of hallucinations   Suicidal ideations none   Homicidal ideations none   Mood:  depressed   Affect:  constricted   Memory recent  adequate   Memory remote:  adequate   Concentration:  adequate   Abstraction:  abstract   Insight:  fair   Reliability fair   Judgment:  limited       MEDICATIONS:  Current Facility-Administered Medications   Medication Dose Route Frequency   ??? digoxin (LANOXIN) tablet 0.125 mg  0.125 mg Oral DAILY   ??? bictegrav-emtricit-tenofov ala (BIKTARVY) 50-200-25 mg tablet 1 Tab  1 Tab Oral DAILY   ??? aspirin delayed-release tablet 81 mg  81 mg Oral DAILY   ??? albuterol (PROVENTIL HFA, VENTOLIN HFA, PROAIR HFA) inhaler 2 Puff  2 Puff Inhalation Q6H PRN    ??? eplerenone (INSPRA) tablet 12.5 mg  12.5 mg Oral DAILY   ??? furosemide (LASIX) tablet 40 mg  40 mg Oral DAILY   ??? artificial tears (dextran 70-hypromellose) (NATURAL BALANCE) 0.1-0.3 % ophthalmic solution 2 Drop  2 Drop Both Eyes DAILY   ??? losartan (COZAAR) tablet 50 mg  50 mg Oral DAILY   ??? metoprolol succinate (TOPROL-XL) XL tablet 100 mg  100 mg Oral QHS   ??? potassium chloride SR (KLOR-CON 10) tablet 20 mEq  20 mEq Oral DAILY WITH BREAKFAST   ??? tamsulosin (FLOMAX) capsule 0.4 mg  0.4 mg Oral QHS   ??? valACYclovir (VALTREX) tablet 500 mg  500 mg Oral DAILY   ??? escitalopram oxalate (LEXAPRO) tablet 10 mg  10 mg Oral DAILY   ??? polyethylene glycol (MIRALAX) packet 17 g  17 g Oral DAILY   ??? OLANZapine (ZyPREXA) tablet 5 mg  5 mg Oral Q6H PRN   ??? haloperidol lactate (HALDOL) injection 5 mg  5 mg IntraMUSCular Q6H PRN   ??? benztropine (COGENTIN) tablet 1 mg  1 mg Oral  BID PRN   ??? diphenhydrAMINE (BENADRYL) injection 50 mg  50 mg IntraMUSCular BID PRN   ??? hydrOXYzine HCl (ATARAX) tablet 50 mg  50 mg Oral TID PRN   ??? LORazepam (ATIVAN) injection 1 mg  1 mg IntraMUSCular Q4H PRN   ??? traZODone (DESYREL) tablet 50 mg  50 mg Oral QHS PRN   ??? acetaminophen (TYLENOL) tablet 650 mg  650 mg Oral Q4H PRN   ??? magnesium hydroxide (MILK OF MAGNESIA) 400 mg/5 mL oral suspension 30 mL  30 mL Oral DAILY PRN      DISCUSSION:   the risks and benefits of the proposed medication  patient given opportunity to ask questions    Lab/Data Review:  All lab results for the last 24 hours reviewed.     Low platelets and WBC    Assessment:     Active Problems:    Major depressive disorder (10/13/2018)        Plan:     Continue current care  Viral load and CD4 count  Disposition planning with social work    Signed By: Lorie Phenix, MD     October 14, 2018

## 2018-10-14 NOTE — Group Note (Cosign Needed)
IP BH GROUP DOCUMENTATION INDIVIDUAL                                                                          Group Therapy Note    Date: October 28    Group Start Time: 1500  Group End Time: 1600  Group Topic: Recreational/Music Therapy    [facility]    [name], Jerry S    IP BH GROUP DOCUMENTATION GROUP    Group Therapy Note    Attendees: 8         Attendance: Did not attend    Patient's Goal:      Interventions/techniques:  Audelia Hives

## 2018-10-14 NOTE — Group Note (Cosign Needed)
IP BH GROUP DOCUMENTATION INDIVIDUAL                                                                          Group Therapy Note    Date: October 28    Group Start Time: 1000  Group End Time: 1100  Group Topic: Topic Group    [facility]    Baker, Beverly S    IP BH GROUP DOCUMENTATION GROUP    Group Therapy Note    Attendees: 7         Attendance: Did not attend    Patient's Goal:      Interventions/techniques  Audelia Hives

## 2018-10-14 NOTE — Progress Notes (Signed)
Problem: Depressed Mood (Adult/Pediatric)  Goal: *STG: Participates in treatment plan  Outcome: Progressing Towards Goal  Goal: *STG: Remains safe in hospital  Outcome: Progressing Towards Goal  Goal: *STG: Complies with medication therapy  Outcome: Progressing Towards Goal     Problem: Suicide  Goal: *STG: Remains safe in hospital  Outcome: Progressing Towards Goal     Problem: Falls - Risk of  Goal: *Absence of Falls  Description  Document Schmid Fall Risk and appropriate interventions in the flowsheet.  Outcome: Progressing Towards Goal  Note:   Fall Risk Interventions:

## 2018-10-14 NOTE — Behavioral Health Treatment Team (Addendum)
0800 pt is visible on the unit. Calm and cooperative. Ate breakfast.     1000 complaint of pain and was given tylenol 650mg  po. Pt refused scheduled furosemide stating he didn't want to get up out of the bed to run to bathroom all day. encouraged to attend groups. Denies si/hi/a/v hall. States he has depression. Pt is isolative to self.     1200 pt ate lunch. voiced no complaints.    1400 pt is resting in room.     1600 pt is resting in room.     1800 pt visible on the unit. Complaint of pain given tylenol. MD ordered lab draw but pt was stuck twice and no blood results. pt states nurse can try again in the morning.  MD called gave order to reschedule for the morning.

## 2018-10-14 NOTE — Progress Notes (Signed)
Problem: Depressed Mood (Adult/Pediatric)  Goal: *STG: Participates in treatment plan  Outcome: Progressing Towards Goal  Goal: *STG: Participates in 1:1 therapy sessions  Outcome: Progressing Towards Goal  Goal: *STG: Verbalizes anger, guilt, and other feelings in a constructive manor  Outcome: Progressing Towards Goal  Goal: *STG: Attends activities and groups  Outcome: Progressing Towards Goal  Goal: *STG: Demonstrates reduction in symptoms and increase in insight into coping skills/future focused  Outcome: Progressing Towards Goal  Goal: *STG: Remains safe in hospital  Outcome: Progressing Towards Goal  Goal: *STG: Complies with medication therapy  Outcome: Progressing Towards Goal  Goal: *LTG: Returns to previous level of functioning and participates with after care plan  Outcome: Progressing Towards Goal  Goal: *LTG: Understands illness and can identify signs of relapse  Outcome: Progressing Towards Goal  Goal: Interventions  Outcome: Progressing Towards Goal     Problem: Suicide  Goal: *STG: Remains safe in hospital  Outcome: Progressing Towards Goal     Problem: Falls - Risk of  Goal: *Absence of Falls  Description  Document Schmid Fall Risk and appropriate interventions in the flowsheet.  Outcome: Progressing Towards Goal  Note:   Fall Risk Interventions:                                Problem: Patient Education: Go to Patient Education Activity  Goal: Patient/Family Education  Outcome: Progressing Towards Goal

## 2018-10-15 DIAGNOSIS — F333 Major depressive disorder, recurrent, severe with psychotic symptoms: Secondary | ICD-10-CM | POA: Diagnosis not present

## 2018-10-15 MED FILL — VALACYCLOVIR 500 MG TAB: 500 mg | ORAL | Qty: 1

## 2018-10-15 MED FILL — DIGOX 125 MCG (0.125 MG) TABLET: 125 mcg (0. mg) | ORAL | Qty: 1

## 2018-10-15 MED FILL — ACETAMINOPHEN 325 MG TABLET: 325 mg | ORAL | Qty: 2

## 2018-10-15 MED FILL — HYDROXYZINE 25 MG TAB: 25 mg | ORAL | Qty: 2

## 2018-10-15 MED FILL — ESCITALOPRAM 10 MG TAB: 10 mg | ORAL | Qty: 1

## 2018-10-15 MED FILL — EPLERENONE 25 MG TAB: 25 mg | ORAL | Qty: 0.5

## 2018-10-15 MED FILL — HEALTHYLAX 17 GRAM ORAL POWDER PACKET: 17 gram | ORAL | Qty: 1

## 2018-10-15 MED FILL — METOPROLOL SUCCINATE SR 50 MG 24 HR TAB: 50 mg | ORAL | Qty: 2

## 2018-10-15 MED FILL — BAYER LOW DOSE ASPIRIN 81 MG TABLET,DELAYED RELEASE: 81 mg | ORAL | Qty: 1

## 2018-10-15 MED FILL — VENTOLIN HFA 90 MCG/ACTUATION AEROSOL INHALER: 90 mcg/actuation | RESPIRATORY_TRACT | Qty: 8

## 2018-10-15 MED FILL — TRAZODONE 50 MG TAB: 50 mg | ORAL | Qty: 1

## 2018-10-15 MED FILL — BIKTARVY 50 MG-200 MG-25 MG TABLET: 50-200-25 mg | ORAL | Qty: 1

## 2018-10-15 MED FILL — POTASSIUM CHLORIDE SR 10 MEQ TAB: 10 mEq | ORAL | Qty: 2

## 2018-10-15 MED FILL — TAMSULOSIN SR 0.4 MG 24 HR CAP: 0.4 mg | ORAL | Qty: 1

## 2018-10-15 MED FILL — LOSARTAN 25 MG TAB: 25 mg | ORAL | Qty: 2

## 2018-10-15 MED FILL — FUROSEMIDE 40 MG TAB: 40 mg | ORAL | Qty: 1

## 2018-10-15 NOTE — Behavioral Health Treatment Team (Signed)
BH  Notes by Lorie Phenix, MD at 10/15/18 1638                Author: Lorie Phenix, MD  Service: PSYCHIATRY  Author Type: Physician       Filed: 10/15/18 2237  Date of Service: 10/15/18 1638  Status: Signed          Editor: Lorie Phenix, MD (Physician)                               Psychiatric Progress Note          Patient: Jerry [name]  MRN: [id number]   SSN: [id number]          Date of Birth: Mar 08, 1961   Age: 57 y.o.   Sex: male         Admit Date: 10/12/2018     LOS: 3 days         Subjective:        Jerry [name]  reports feeling depressed still and moods are down.  Continues to have thoughts of running in front of a bus.  Denies  HI/AH/VH.  No aggression or violence.  Appropriately interactive and aware. Tolerating medications well.  Eating well and sleeping well.  Was in [place] but is thinking of relocating to [place]. Complains of low back pain and is HIV(+).  No supports  anymore.      10/29 Jerry [name] reports sleeping better and feels that he is doing a little better overall. Denies SI/HI/AH/VH.  Contemplating his disposition plan and is thinking about returning to [facility].  Still depressed with intermittent thoughts  of ending his life but less than on admission.          Objective:          Vitals:             10/14/18 0831  10/14/18 1954  10/15/18 0813  10/15/18 0847           BP:  121/78  119/75  109/73  115/76     Pulse:  70  78  77  72     Resp:  16  16  16  18      Temp:  97.4 ??F (36.3 ??C)  98.6 ??F (37 ??C)  97.5 ??F (36.4 ??C)  97.5 ??F (36.4 ??C)     SpO2:  98%  98%  98%  100%     Weight:                   Height:                    Mental Status Exam:       Sensorium   oriented to time, place and person        Orientation  situation        Relations  cooperative        Eye Contact  appropriate     Appearance:   age appropriate        Motor Behavior:   within normal limits        Speech:   normal volume and non-pressured         Vocabulary  average        Thought Process:  goal directed     Thought Content  free of delusions and free of hallucinations  Suicidal ideations  none        Homicidal ideations  none     Mood:   depressed     Affect:   constricted        Memory recent   adequate        Memory remote:   adequate        Concentration:   adequate        Abstraction:   abstract     Insight:   fair     Reliability  fair        Judgment:   limited           MEDICATIONS:     Current Facility-Administered Medications          Medication  Dose  Route  Frequency           ?  digoxin (LANOXIN) tablet 0.125 mg   0.125 mg  Oral  DAILY     ?  bictegrav-emtricit-tenofov ala (BIKTARVY) 50-200-25 mg tablet 1 Tab   1 Tab  Oral  DAILY     ?  aspirin delayed-release tablet 81 mg   81 mg  Oral  DAILY     ?  albuterol (PROVENTIL HFA, VENTOLIN HFA, PROAIR HFA) inhaler 2 Puff   2 Puff  Inhalation  Q6H PRN     ?  eplerenone (INSPRA) tablet 12.5 mg   12.5 mg  Oral  DAILY     ?  furosemide (LASIX) tablet 40 mg   40 mg  Oral  DAILY     ?  artificial tears (dextran 70-hypromellose) (NATURAL BALANCE) 0.1-0.3 % ophthalmic solution 2 Drop   2 Drop  Both Eyes  DAILY     ?  losartan (COZAAR) tablet 50 mg   50 mg  Oral  DAILY           ?  metoprolol succinate (TOPROL-XL) XL tablet 100 mg   100 mg  Oral  QHS           ?  potassium chloride SR (KLOR-CON 10) tablet 20 mEq   20 mEq  Oral  DAILY WITH BREAKFAST     ?  tamsulosin (FLOMAX) capsule 0.4 mg   0.4 mg  Oral  QHS     ?  valACYclovir (VALTREX) tablet 500 mg   500 mg  Oral  DAILY     ?  escitalopram oxalate (LEXAPRO) tablet 10 mg   10 mg  Oral  DAILY     ?  polyethylene glycol (MIRALAX) packet 17 g   17 g  Oral  DAILY     ?  OLANZapine (ZyPREXA) tablet 5 mg   5 mg  Oral  Q6H PRN     ?  haloperidol lactate (HALDOL) injection 5 mg   5 mg  IntraMUSCular  Q6H PRN     ?  benztropine (COGENTIN) tablet 1 mg   1 mg  Oral  BID PRN     ?  diphenhydrAMINE (BENADRYL) injection 50 mg   50 mg  IntraMUSCular  BID PRN     ?   hydrOXYzine HCl (ATARAX) tablet 50 mg   50 mg  Oral  TID PRN     ?  LORazepam (ATIVAN) injection 1 mg   1 mg  IntraMUSCular  Q4H PRN     ?  traZODone (DESYREL) tablet 50 mg   50 mg  Oral  QHS PRN     ?  acetaminophen (TYLENOL) tablet 650  mg   650 mg  Oral  Q4H PRN           ?  magnesium hydroxide (MILK OF MAGNESIA) 400 mg/5 mL oral suspension 30 mL   30 mL  Oral  DAILY PRN         DISCUSSION:    the risks and benefits of the proposed medication   patient given opportunity to ask questions      Lab/Data Review:   All lab results for the last 24 hours reviewed.       Low platelets and WBC        Assessment:        Principal Problem:     Major depressive disorder (10/13/2018)              Plan:        Continue current care   Viral load and CD4 count still pending   Disposition planning with social work         Signed By:  Lorie Phenix, MD           October 15, 2018

## 2018-10-15 NOTE — Behavioral Health Treatment Team (Signed)
0800-Introduced self, physical assesment completed. Patient voiced no concerns. Denies SI or HI.   0900-Tolerated 0800 medication. Ate breakfast.   1100- Patient attended group. Quiet did not participate  1200- Tylenol 650 mg and atarax 50 mg for left lower back pain 7/10.   1245-Patient reported pain reduced to 5/10  1400- In room during quiet time.   1630- In room  appears to be sleeping.  1735- Patient request tylenol 650 mg for continue lower back pain. He reports that this symptoms just came on spontaneously two days ago. Current pain 5/10.   1841-Patient lying down in bed. Reports that after the Tylenol 6 50 mg pain same 5/10

## 2018-10-15 NOTE — Behavioral Health Treatment Team (Signed)
Patients has been hypotensive  (89/53) this shift and Metoprolol was held. Patient became irate, yelling and cursing at nurse. Demanding that nurse give him b/p medication. Explained that his blood pressure was to low at this time. Rechecked b/p and still low. Patient demanding to leave AMA. On call paged and spoke with Guerry Minors NP and she requested Crisis to evaluate patient. Spoke with Greggory Stallion from Crisis, he spoke with patient and patient has agreed to stay voluntary until the morning and speak with the doctor.

## 2018-10-15 NOTE — Progress Notes (Signed)
Problem: Depressed Mood (Adult/Pediatric)  Goal: *STG: Participates in treatment plan  Outcome: Progressing Towards Goal  Goal: *STG: Demonstrates reduction in symptoms and increase in insight into coping skills/future focused  Outcome: Progressing Towards Goal  Goal: *STG: Remains safe in hospital  Outcome: Progressing Towards Goal  Goal: *STG: Complies with medication therapy  Outcome: Progressing Towards Goal  Goal: *LTG: Understands illness and can identify signs of relapse  Outcome: Progressing Towards Goal  Goal: Interventions  Outcome: Progressing Towards Goal     Problem: Suicide  Goal: *STG: Remains safe in hospital  Outcome: Progressing Towards Goal     Problem: Falls - Risk of  Goal: *Absence of Falls  Description  Document Schmid Fall Risk and appropriate interventions in the flowsheet.  Outcome: Progressing Towards Goal  Note:   Fall Risk Interventions:                                Problem: Patient Education: Go to Patient Education Activity  Goal: Patient/Family Education  Outcome: Progressing Towards Goal

## 2018-10-15 NOTE — Behavioral Health Treatment Team (Deleted)
Nurse notified of low bp

## 2018-10-15 NOTE — Progress Notes (Signed)
Patient calm and co-operative with staff. Attended group. Asking for medications when he is feeling anxious or in pain. Insight and awareness present.

## 2018-10-15 NOTE — Group Note (Cosign Needed)
IP BH GROUP DOCUMENTATION INDIVIDUAL                                                                          Group Therapy Note    Date: October 29    Group Start Time: 1100  Group End Time: 1200  Group Topic: Topic Group    [facility]    [name], Jerry S    IP BH GROUP DOCUMENTATION GROUP    Group Therapy Note    Attendees: 8         Attendance: Attended    Patient's Goal:  To participate in happiness game    Interventions/techniques: Supported-life scenarios exploring the pursuit of happiness    Follows Directions: Followed directions    Interactions: Interacted appropriately    Mental Status: Calm    Behavior/appearance: Cooperative    Goals Achieved: Able to engage in interactions      Additional Notes:      Jerry [name]

## 2018-10-15 NOTE — Group Note (Signed)
IP BH GROUP DOCUMENTATION INDIVIDUAL                                                                          Group Therapy Note    Date: October 29    Group Start Time: 1430  Group End Time: 1445  Group Topic: Focus Group    [facility]    Foster, Odette    IP BH GROUP DOCUMENTATION GROUP    Group Therapy Note    Attendees: 5         Attendance:     Patient's Goal:      Interventions/techniques:     Follows Directions:     Interactions:     Mental Status:     Behavior/appearance:     Goals Achieved:       Additional Notes:    Dominic Pea

## 2018-10-15 NOTE — Behavioral Health Treatment Team (Cosign Needed)
Nurse notified of low bp

## 2018-10-15 NOTE — Group Note (Cosign Needed)
IP BH GROUP DOCUMENTATION INDIVIDUAL                                                                          Group Therapy Note    Date: October 29    Group Start Time: 1500  Group End Time: 1600  Group Topic: Recreational/Music Therapy    [facility]    Baker, Beverly S    IP BH GROUP DOCUMENTATION GROUP    Group Therapy Note    Attendees: 9         Attendance: Attended    Patient's Goal:  To concentrate on selected task    Interventions/techniques: Supported-crafts,games,music    Follows Directions: Followed directions    Interactions: Interacted appropriately    Mental Status: Calm    Behavior/appearance: Cooperative    Goals Achieved: Able to engage in interactions      Additional Notes:      Audelia Hives

## 2018-10-15 NOTE — Behavioral Health Treatment Team (Addendum)
0800-Introduced self, physical assesment completed. Patient voiced no concerns. Denies SI or HI.   0900-Tolerated 0800 medication. Ate breakfast.   1100- Patient attended group. Quiet did not participate  1200- Tylenol 650 mg and atarax 50 mg for left lower back pain 7/10.   1245-Patient reported pain reduced to 5/10  1400- In room during quiet time.   1630- In room  appears to be sleeping.  1735- Patient request tylenol 650 mg for continue lower back pain. He reports that this symptoms just came on spontaneously two days ago. Current pain 5/10.   1841-Patient lying down in bed. Reports that after the Tylenol 6 50 mg pain same 5/10

## 2018-10-15 NOTE — Behavioral Health Treatment Team (Signed)
Psychiatric Progress Note    Patient: Jerry [name] MRN: [id number]  SSN: [id number]    Date of Birth: 1961-09-05  Age: 57 y.o.  Sex: male      Admit Date: 10/12/2018    LOS: 3 days     Subjective:     Jerry [name]  reports feeling depressed still and moods are down.  Continues to have thoughts of running in front of a bus.  Denies HI/AH/VH.  No aggression or violence.  Appropriately interactive and aware. Tolerating medications well.  Eating well and sleeping well.  Was in [place] but is thinking of relocating to [place]. Complains of low back pain and is HIV(+).  No supports anymore.    10/29 Jerry [name] reports sleeping better and feels that he is doing a little better overall. Denies SI/HI/AH/VH.  Contemplating his disposition plan and is thinking about returning to [facility].  Still depressed with intermittent thoughts of ending his life but less than on admission.      Objective:     Vitals:    10/14/18 0831 10/14/18 1954 10/15/18 0813 10/15/18 0847   BP: 121/78 119/75 109/73 115/76   Pulse: 70 78 77 72   Resp: 16 16 16 18    Temp: 97.4 ??F (36.3 ??C) 98.6 ??F (37 ??C) 97.5 ??F (36.4 ??C) 97.5 ??F (36.4 ??C)   SpO2: 98% 98% 98% 100%   Weight:       Height:            Mental Status Exam:   Sensorium  oriented to time, place and person   Orientation situation   Relations cooperative   Eye Contact appropriate   Appearance:  age appropriate   Motor Behavior:  within normal limits   Speech:  normal volume and non-pressured   Vocabulary average   Thought Process: goal directed   Thought Content free of delusions and free of hallucinations   Suicidal ideations none   Homicidal ideations none   Mood:  depressed   Affect:  constricted   Memory recent  adequate   Memory remote:  adequate   Concentration:  adequate   Abstraction:  abstract   Insight:  fair   Reliability fair   Judgment:  limited       MEDICATIONS:  Current Facility-Administered Medications    Medication Dose Route Frequency   ??? digoxin (LANOXIN) tablet 0.125 mg  0.125 mg Oral DAILY   ??? bictegrav-emtricit-tenofov ala (BIKTARVY) 50-200-25 mg tablet 1 Tab  1 Tab Oral DAILY   ??? aspirin delayed-release tablet 81 mg  81 mg Oral DAILY   ??? albuterol (PROVENTIL HFA, VENTOLIN HFA, PROAIR HFA) inhaler 2 Puff  2 Puff Inhalation Q6H PRN   ??? eplerenone (INSPRA) tablet 12.5 mg  12.5 mg Oral DAILY   ??? furosemide (LASIX) tablet 40 mg  40 mg Oral DAILY   ??? artificial tears (dextran 70-hypromellose) (NATURAL BALANCE) 0.1-0.3 % ophthalmic solution 2 Drop  2 Drop Both Eyes DAILY   ??? losartan (COZAAR) tablet 50 mg  50 mg Oral DAILY   ??? metoprolol succinate (TOPROL-XL) XL tablet 100 mg  100 mg Oral QHS   ??? potassium chloride SR (KLOR-CON 10) tablet 20 mEq  20 mEq Oral DAILY WITH BREAKFAST   ??? tamsulosin (FLOMAX) capsule 0.4 mg  0.4 mg Oral QHS   ??? valACYclovir (VALTREX) tablet 500 mg  500 mg Oral DAILY   ??? escitalopram oxalate (LEXAPRO) tablet 10 mg  10 mg Oral DAILY   ???  polyethylene glycol (MIRALAX) packet 17 g  17 g Oral DAILY   ??? OLANZapine (ZyPREXA) tablet 5 mg  5 mg Oral Q6H PRN   ??? haloperidol lactate (HALDOL) injection 5 mg  5 mg IntraMUSCular Q6H PRN   ??? benztropine (COGENTIN) tablet 1 mg  1 mg Oral BID PRN   ??? diphenhydrAMINE (BENADRYL) injection 50 mg  50 mg IntraMUSCular BID PRN   ??? hydrOXYzine HCl (ATARAX) tablet 50 mg  50 mg Oral TID PRN   ??? LORazepam (ATIVAN) injection 1 mg  1 mg IntraMUSCular Q4H PRN   ??? traZODone (DESYREL) tablet 50 mg  50 mg Oral QHS PRN   ??? acetaminophen (TYLENOL) tablet 650 mg  650 mg Oral Q4H PRN   ??? magnesium hydroxide (MILK OF MAGNESIA) 400 mg/5 mL oral suspension 30 mL  30 mL Oral DAILY PRN      DISCUSSION:   the risks and benefits of the proposed medication  patient given opportunity to ask questions    Lab/Data Review:  All lab results for the last 24 hours reviewed.     Low platelets and WBC    Assessment:     Principal Problem:    Major depressive disorder (10/13/2018)        Plan:      Continue current care  Viral load and CD4 count still pending  Disposition planning with social work    Signed By: Lorie Phenix, MD     October 15, 2018

## 2018-10-15 NOTE — Progress Notes (Signed)
Patient calm and co-operative with staff. Attended group. Asking for medications when he is feeling anxious or in pain. Insight and awareness present.

## 2018-10-15 NOTE — Group Note (Signed)
IP BH GROUP DOCUMENTATION INDIVIDUAL                                                                          Group Therapy Note    Date: October 29    Group Start Time: 1430  Group End Time: 1445  Group Topic: Focus Group    [facility]    [name], Jerry    IP BH GROUP DOCUMENTATION GROUP    Group Therapy Note    Attendees:5          Attendance:Did not attend    Patient's Goal:      Interventions/techniques:     Follows Directions:     Interactions:     Mental Status:     Behavior/appearance:     Goals Achieved:       Additional Notes:      Jerry [name]

## 2018-10-15 NOTE — Progress Notes (Signed)
Problem: Depressed Mood (Adult/Pediatric)  Goal: *STG: Participates in treatment plan  Outcome: Progressing Towards Goal  Goal: *STG: Demonstrates reduction in symptoms and increase in insight into coping skills/future focused  Outcome: Progressing Towards Goal  Goal: *STG: Remains safe in hospital  Outcome: Progressing Towards Goal  Goal: *STG: Complies with medication therapy  Outcome: Progressing Towards Goal  Goal: *LTG: Understands illness and can identify signs of relapse  Outcome: Progressing Towards Goal  Goal: Interventions  Outcome: Progressing Towards Goal     Problem: Suicide  Goal: *STG: Remains safe in hospital  Outcome: Progressing Towards Goal     Problem: Falls - Risk of  Goal: *Absence of Falls  Description  Document Schmid Fall Risk and appropriate interventions in the flowsheet.  Outcome: Progressing Towards Goal  Note:   Fall Risk Interventions:                                Problem: Patient Education: Go to Patient Education Activity  Goal: Patient/Family Education  Outcome: Progressing Towards Goal

## 2018-10-16 DIAGNOSIS — F333 Major depressive disorder, recurrent, severe with psychotic symptoms: Secondary | ICD-10-CM | POA: Diagnosis not present

## 2018-10-16 LAB — LYMPHOCYTES, CD4 PERCENT AND ABSOLUTE
% CD 4 Pos Lymph: 45 % (ref 30.8–58.5)
% CD4 Pos Lymph: 45 % (ref 30.8–58.5)
ABS. BASOPHILS: 0 10*3/uL (ref 0.0–0.2)
ABS. EOSINOPHILS: 0 10*3/uL (ref 0.0–0.4)
ABS. IMM. GRANS.: 0 10*3/uL (ref 0.0–0.1)
ABS. MONOCYTES: 0.3 10*3/uL (ref 0.1–0.9)
ABS. NEUTROPHILS: 0.7 10*3/uL — ABNORMAL LOW (ref 1.4–7.0)
ABSOLUTE CD 4 HELPER, 505010: 585 /uL (ref 359–1519)
Abs CD4 Helper: 585 /uL (ref 359–1519)
Abs Lymphocytes: 1.3 10*3/uL (ref 0.7–3.1)
BASOPHILS: 1 %
Basophils %: 1 %
Basophils Absolute: 0 10*3/uL (ref 0.0–0.2)
EOSINOPHILS: 1 %
Eosinophils %: 1 %
Eosinophils Absolute: 0 10*3/uL (ref 0.0–0.4)
Granulocyte Absolute Count: 0 10*3/uL (ref 0.0–0.1)
HCT: 48.4 % (ref 37.5–51.0)
HGB: 17 g/dL (ref 13.0–17.7)
Hematocrit: 48.4 % (ref 37.5–51.0)
Hemoglobin: 17 g/dL (ref 13.0–17.7)
IMMATURE GRANULOCYTES: 1 %
Immature Granulocytes: 1 %
Lymphocytes %: 57 %
Lymphocytes Absolute: 1.3 10*3/uL (ref 0.7–3.1)
Lymphocytes: 57 %
MCH: 32 pg (ref 26.6–33.0)
MCH: 32 pg (ref 26.6–33.0)
MCHC: 35.1 g/dL (ref 31.5–35.7)
MCHC: 35.1 g/dL (ref 31.5–35.7)
MCV: 91 fL (ref 79–97)
MCV: 91 fL (ref 79–97)
MONOCYTES: 11 %
Monocytes %: 11 %
Monocytes Absolute: 0.3 10*3/uL (ref 0.1–0.9)
NEUTROPHILS: 29 %
Neutrophils %: 29 %
Neutrophils Absolute: 0.7 10*3/uL — ABNORMAL LOW (ref 1.4–7.0)
PLATELET: 161 10*3/uL (ref 150–450)
Platelets: 161 10*3/uL (ref 150–450)
RBC, G6PD2L: 5.31 x10E6/uL (ref 4.14–5.80)
RBC: 5.31 x10E6/uL (ref 4.14–5.80)
RDW: 14.1 % (ref 12.3–15.4)
RDW: 14.1 % (ref 12.3–15.4)
WBC: 2.2 10*3/uL — ABNORMAL LOW (ref 3.4–10.8)
WBC: 2.2 10*3/uL — ABNORMAL LOW (ref 3.4–10.8)

## 2018-10-16 LAB — HIV-1 RNA QT BY PCR
HIV-1 RNA by PCR: <20 copies/mL
HIV-1 RNA by PCR: <20 copies/mL

## 2018-10-16 MED ORDER — METOPROLOL SUCCINATE SR 50 MG 24 HR TAB
50 mg | ORAL_TABLET | Freq: Every evening | ORAL | 0 refills | Status: AC
Start: 2018-10-16 — End: ?

## 2018-10-16 MED ORDER — HYDROXYZINE 50 MG TAB
50 mg | ORAL_TABLET | Freq: Three times a day (TID) | ORAL | 0 refills | Status: AC | PRN
Start: 2018-10-16 — End: 2018-10-26

## 2018-10-16 MED ORDER — TRAZODONE 50 MG TAB
50 mg | ORAL_TABLET | Freq: Every evening | ORAL | 0 refills | Status: AC | PRN
Start: 2018-10-16 — End: ?

## 2018-10-16 MED ORDER — METOPROLOL SUCCINATE SR 50 MG 24 HR TAB
50 mg | Freq: Every evening | ORAL | Status: DC
Start: 2018-10-16 — End: 2018-10-17
  Administered 2018-10-17: 01:00:00 via ORAL

## 2018-10-16 MED ORDER — ESCITALOPRAM 10 MG TAB
10 mg | ORAL_TABLET | Freq: Every day | ORAL | 0 refills | Status: AC
Start: 2018-10-16 — End: ?

## 2018-10-16 MED ORDER — POLYETHYLENE GLYCOL 3350 17 GRAM (100 %) ORAL POWDER PACKET
17 gram | PACK | Freq: Every day | ORAL | 0 refills | Status: AC
Start: 2018-10-16 — End: ?

## 2018-10-16 MED FILL — BIKTARVY 50 MG-200 MG-25 MG TABLET: 50-200-25 mg | ORAL | Qty: 1

## 2018-10-16 MED FILL — TAMSULOSIN SR 0.4 MG 24 HR CAP: 0.4 mg | ORAL | Qty: 1

## 2018-10-16 MED FILL — ACETAMINOPHEN 325 MG TABLET: 325 mg | ORAL | Qty: 2

## 2018-10-16 MED FILL — LOSARTAN 25 MG TAB: 25 mg | ORAL | Qty: 2

## 2018-10-16 MED FILL — EPLERENONE 25 MG TAB: 25 mg | ORAL | Qty: 0.5

## 2018-10-16 MED FILL — DIGOX 125 MCG (0.125 MG) TABLET: 125 mcg (0. mg) | ORAL | Qty: 1

## 2018-10-16 MED FILL — VALACYCLOVIR 500 MG TAB: 500 mg | ORAL | Qty: 1

## 2018-10-16 MED FILL — ESCITALOPRAM 10 MG TAB: 10 mg | ORAL | Qty: 1

## 2018-10-16 MED FILL — TRAZODONE 50 MG TAB: 50 mg | ORAL | Qty: 1

## 2018-10-16 MED FILL — POTASSIUM CHLORIDE SR 10 MEQ TAB: 10 mEq | ORAL | Qty: 2

## 2018-10-16 MED FILL — HYDROXYZINE 25 MG TAB: 25 mg | ORAL | Qty: 2

## 2018-10-16 MED FILL — FUROSEMIDE 40 MG TAB: 40 mg | ORAL | Qty: 1

## 2018-10-16 MED FILL — HEALTHYLAX 17 GRAM ORAL POWDER PACKET: 17 gram | ORAL | Qty: 1

## 2018-10-16 MED FILL — METOPROLOL SUCCINATE SR 50 MG 24 HR TAB: 50 mg | ORAL | Qty: 2

## 2018-10-16 MED FILL — BAYER LOW DOSE ASPIRIN 81 MG TABLET,DELAYED RELEASE: 81 mg | ORAL | Qty: 1

## 2018-10-16 NOTE — Behavioral Health Treatment Team (Signed)
Behavioral Health Treatment Team Note     Patient goal(s) for today: Continue taking medications as prescribed; prepare for discharge tomorrow  Treatment team focus/goals: Prepare for discharge tomorrow    Progress note: Patient still demanding and irritable.  Appears calm and cooperative when MD is present, but unpleasant with other staff.  Demanding to discharge to [place], [place].    LOS:  4  Expected LOS: 5    Insurance info/prescription coverage:  Medicaid of [place]  Date of last family contact:  None  Family requesting physician contact today:  no  Discharge plan:  D/C to [place], [place]  Guns in the home:  no   Outpatient provider(s):  Link to service in [place], [place]    Participating treatment team members: Alethia Berthold, MSW; Dr. Gaynell Face, MD

## 2018-10-16 NOTE — Behavioral Health Treatment Team (Signed)
Patient observed in bed resting quietly with eyes closed. Slept approx 6.5 hours.

## 2018-10-16 NOTE — Progress Notes (Signed)
 Diet as tolerated.  Hx remarkable for elev TAG (190) and BMI (29.53).  Ht: 5'9  Wt: 200 lb  BMI: 29.53 kg/(m^2) c/w overweight  Est energy needs: 1845 kcal, 70 g protein, 1 mL/kcal fluids  Pt will consume > 75% of meals at follow up 7-10 days  LOS

## 2018-10-16 NOTE — Progress Notes (Signed)
Problem: Depressed Mood (Adult/Pediatric)  Goal: *STG: Remains safe in hospital  Outcome: Progressing Towards Goal

## 2018-10-16 NOTE — Progress Notes (Signed)
Problem: Depressed Mood (Adult/Pediatric)  Goal: *STG: Participates in treatment plan  Outcome: Resolved/Met  Goal: *STG: Attends activities and groups  Outcome: Resolved/Met  Goal: *STG: Demonstrates reduction in symptoms and increase in insight into coping skills/future focused  Outcome: Resolved/Met  Goal: *STG: Remains safe in hospital  Outcome: Resolved/Met  Goal: *STG: Complies with medication therapy  Outcome: Resolved/Met

## 2018-10-16 NOTE — Behavioral Health Treatment Team (Signed)
0700-1900  Patient report received from off going shift nurse.  Patient has been calm and cooperative today.  He is med compliant and has been pleasant to staff and peers.   He denies si/hi/avh.  He has a flat affect and is isolative but bright attitude when interacted with. Patient has complained of lower back pain. PRN Tylenol given.  Discharged tomorrow, per MD in the morning.  Patient has no other concerns or complaints at this time.  Hourly rounds completed.  Shift report given to on coming shift nurse.

## 2018-10-16 NOTE — Behavioral Health Treatment Team (Signed)
BH  Notes by Lorie Phenix, MD at 10/16/18 1118                Author: Lorie Phenix, MD  Service: PSYCHIATRY  Author Type: Physician       Filed: 10/16/18 1123  Date of Service: 10/16/18 1118  Status: Signed          Editor: Lorie Phenix, MD (Physician)                               Psychiatric Progress Note          Patient: Jerry [name]  MRN: [id number]   SSN: [id number]          Date of Birth: Nov 08, 1961   Age: 57 y.o.   Sex: male         Admit Date: 10/12/2018     LOS: 4 days         Subjective:        Jerry [name]  reports feeling depressed still and moods are down.  Continues to have thoughts of running in front of a bus.  Denies  HI/AH/VH.  No aggression or violence.  Appropriately interactive and aware. Tolerating medications well.  Eating well and sleeping well.  Was in [place] but is thinking of relocating to [place]. Complains of low back pain and is HIV(+).  No supports  anymore.      10/29 Jerry [name] reports sleeping better and feels that he is doing a little better overall. Denies SI/HI/AH/VH.  Contemplating his disposition plan and is thinking about returning to [facility].  Still depressed with intermittent thoughts  of ending his life but less than on admission.        10/30 Jerry [name] reports feeling a lot better than the previous days having concluded where he wants to go and how he wants to manage going forward.  Denies SI/HI/AH/VH.  His blood pressures have been on the low side on 3 blood pressure medications.  He  thought it may be the lexapro, however his meds were explained to him today.  Wants to return to [facility].        Objective:          Vitals:             10/15/18 0847  10/15/18 2010  10/16/18 0735  10/16/18 0900           BP:  115/76  (!) 89/53  105/51  108/66     Pulse:  72  83  68       Resp:  18  20  20        Temp:  97.5 ??F (36.4 ??C)  98.1 ??F (36.7 ??C)  98 ??F (36.7 ??C)       SpO2:  100%  98%  98%       Weight:                    Height:                    Mental Status Exam:       Sensorium   oriented to time, place and person        Orientation  situation        Relations  cooperative        Eye Contact  appropriate  Appearance:   age appropriate        Motor Behavior:   within normal limits        Speech:   normal volume and non-pressured        Vocabulary  average        Thought Process:  goal directed     Thought Content  free of delusions and free of hallucinations        Suicidal ideations  none        Homicidal ideations  none     Mood:   depressed     Affect:   constricted        Memory recent   adequate        Memory remote:   adequate        Concentration:   adequate        Abstraction:   abstract     Insight:   fair     Reliability  fair        Judgment:   limited           MEDICATIONS:     Current Facility-Administered Medications          Medication  Dose  Route  Frequency           ?  metoprolol succinate (TOPROL-XL) XL tablet 50 mg   50 mg  Oral  QHS     ?  digoxin (LANOXIN) tablet 0.125 mg   0.125 mg  Oral  DAILY     ?  bictegrav-emtricit-tenofov ala (BIKTARVY) 50-200-25 mg tablet 1 Tab   1 Tab  Oral  DAILY     ?  aspirin delayed-release tablet 81 mg   81 mg  Oral  DAILY     ?  albuterol (PROVENTIL HFA, VENTOLIN HFA, PROAIR HFA) inhaler 2 Puff   2 Puff  Inhalation  Q6H PRN     ?  eplerenone (INSPRA) tablet 12.5 mg   12.5 mg  Oral  DAILY     ?  furosemide (LASIX) tablet 40 mg   40 mg  Oral  DAILY           ?  artificial tears (dextran 70-hypromellose) (NATURAL BALANCE) 0.1-0.3 % ophthalmic solution 2 Drop   2 Drop  Both Eyes  DAILY           ?  potassium chloride SR (KLOR-CON 10) tablet 20 mEq   20 mEq  Oral  DAILY WITH BREAKFAST     ?  tamsulosin (FLOMAX) capsule 0.4 mg   0.4 mg  Oral  QHS     ?  valACYclovir (VALTREX) tablet 500 mg   500 mg  Oral  DAILY     ?  escitalopram oxalate (LEXAPRO) tablet 10 mg   10 mg  Oral  DAILY     ?  polyethylene glycol (MIRALAX) packet 17 g   17 g  Oral  DAILY     ?   OLANZapine (ZyPREXA) tablet 5 mg   5 mg  Oral  Q6H PRN     ?  haloperidol lactate (HALDOL) injection 5 mg   5 mg  IntraMUSCular  Q6H PRN     ?  benztropine (COGENTIN) tablet 1 mg   1 mg  Oral  BID PRN     ?  diphenhydrAMINE (BENADRYL) injection 50 mg   50 mg  IntraMUSCular  BID PRN     ?  hydrOXYzine HCl (ATARAX) tablet 50 mg  50 mg  Oral  TID PRN     ?  LORazepam (ATIVAN) injection 1 mg   1 mg  IntraMUSCular  Q4H PRN     ?  traZODone (DESYREL) tablet 50 mg   50 mg  Oral  QHS PRN     ?  acetaminophen (TYLENOL) tablet 650 mg   650 mg  Oral  Q4H PRN           ?  magnesium hydroxide (MILK OF MAGNESIA) 400 mg/5 mL oral suspension 30 mL   30 mL  Oral  DAILY PRN         DISCUSSION:    the risks and benefits of the proposed medication   patient given opportunity to ask questions      Lab/Data Review:   All lab results for the last 24 hours reviewed.       Low platelets and WBC        Assessment:        Principal Problem:     Major depressive disorder (10/13/2018)              Plan:        Continue current care   Viral load and CD4 count still pending   Disposition planning with social work for tomorrow         Signed By:  Lorie Phenix, MD           October 16, 2018

## 2018-10-16 NOTE — Behavioral Health Treatment Team (Signed)
Behavioral Health Treatment Team Note     Patient goal(s) for today: Continue taking medications as prescribed; prepare for discharge tomorrow  Treatment team focus/goals: Prepare for discharge tomorrow    Progress note: Patient still demanding and irritable.  Appears calm and cooperative when MD is present, but unpleasant with other staff.  Demanding to discharge to [place], [place].    LOS:  4  Expected LOS: 5    Insurance info/prescription coverage:  Medicaid of [place]  Date of last family contact:  None  Family requesting physician contact today:  no  Discharge plan:  D/C to [place], [place]  Guns in the home:  no   Outpatient provider(s):  Link to service in [place], [place]    Participating treatment team members: Keegen Mitchell Row, Katherine Wallace, MSW; Dr. Marshall, MD

## 2018-10-16 NOTE — Behavioral Health Treatment Team (Signed)
Patient observed in bed resting quietly with eyes closed. Slept approx 6.5 hours.

## 2018-10-16 NOTE — Progress Notes (Signed)
Problem: Depressed Mood (Adult/Pediatric)  Goal: *STG: Participates in treatment plan  Outcome: Resolved/Met  Goal: *STG: Attends activities and groups  Outcome: Resolved/Met  Goal: *STG: Demonstrates reduction in symptoms and increase in insight into coping skills/future focused  Outcome: Resolved/Met  Goal: *STG: Remains safe in hospital  Outcome: Resolved/Met  Goal: *STG: Complies with medication therapy  Outcome: Resolved/Met

## 2018-10-16 NOTE — Group Note (Cosign Needed)
IP BH GROUP DOCUMENTATION INDIVIDUAL                                                                          Group Therapy Note    Date: October 30    Group Start Time: 1500  Group End Time: 1600  Group Topic: Recreational/Music Therapy    [facility]    Baker, Beverly S    IP BH GROUP DOCUMENTATION GROUP    Group Therapy Note    Attendees: 6         Attendance: Attended    Patient's Goal:  To concentrate on selected task    Interventions/techniques: Supported-crafts,games,music    Follows Directions: Followed directions    Interactions: Interacted appropriately    Mental Status: Calm    Behavior/appearance: Cooperative    Goals Achieved: Able to engage in interactions      Additional Notes:  Completed selected task    Audelia Hives

## 2018-10-16 NOTE — Behavioral Health Treatment Team (Signed)
Psychiatric Progress Note    Patient: Jerry [name] MRN: [id number]  SSN: [id number]    Date of Birth: 1961/11/21  Age: 57 y.o.  Sex: male      Admit Date: 10/12/2018    LOS: 4 days     Subjective:     [place]  reports feeling depressed still and moods are down.  Continues to have thoughts of running in front of a bus.  Denies HI/AH/VH.  No aggression or violence.  Appropriately interactive and aware. Tolerating medications well.  Eating well and sleeping well.  Was in [place] but is thinking of relocating to [place]. Complains of low back pain and is HIV(+).  No supports anymore.    10/29 [name] reports sleeping better and feels that he is doing a little better overall. Denies SI/HI/AH/VH.  Contemplating his disposition plan and is thinking about returning to [facility].  Still depressed with intermittent thoughts of ending his life but less than on admission.      10/30 [name] reports feeling a lot better than the previous days having concluded where he wants to go and how he wants to manage going forward.  Denies SI/HI/AH/VH.  His blood pressures have been on the low side on 3 blood pressure medications.  He thought it may be the lexapro, however his meds were explained to him today.  Wants to return to [facility].    Objective:     Vitals:    10/15/18 0847 10/15/18 2010 10/16/18 0735 10/16/18 0900   BP: 115/76 (!) 89/53 105/51 108/66   Pulse: 72 83 68    Resp: 18 20 20     Temp: 97.5 ??F (36.4 ??C) 98.1 ??F (36.7 ??C) 98 ??F (36.7 ??C)    SpO2: 100% 98% 98%    Weight:       Height:            Mental Status Exam:   Sensorium  oriented to time, place and person   Orientation situation   Relations cooperative   Eye Contact appropriate   Appearance:  age appropriate   Motor Behavior:  within normal limits   Speech:  normal volume and non-pressured   Vocabulary average   Thought Process: goal directed   Thought Content free of delusions and free of hallucinations    Suicidal ideations none   Homicidal ideations none   Mood:  depressed   Affect:  constricted   Memory recent  adequate   Memory remote:  adequate   Concentration:  adequate   Abstraction:  abstract   Insight:  fair   Reliability fair   Judgment:  limited       MEDICATIONS:  Current Facility-Administered Medications   Medication Dose Route Frequency   ??? metoprolol succinate (TOPROL-XL) XL tablet 50 mg  50 mg Oral QHS   ??? digoxin (LANOXIN) tablet 0.125 mg  0.125 mg Oral DAILY   ??? bictegrav-emtricit-tenofov ala (BIKTARVY) 50-200-25 mg tablet 1 Tab  1 Tab Oral DAILY   ??? aspirin delayed-release tablet 81 mg  81 mg Oral DAILY   ??? albuterol (PROVENTIL HFA, VENTOLIN HFA, PROAIR HFA) inhaler 2 Puff  2 Puff Inhalation Q6H PRN   ??? eplerenone (INSPRA) tablet 12.5 mg  12.5 mg Oral DAILY   ??? furosemide (LASIX) tablet 40 mg  40 mg Oral DAILY   ??? artificial tears (dextran 70-hypromellose) (NATURAL BALANCE) 0.1-0.3 % ophthalmic solution 2 Drop  2 Drop Both Eyes DAILY   ???  potassium chloride SR (KLOR-CON 10) tablet 20 mEq  20 mEq Oral DAILY WITH BREAKFAST   ??? tamsulosin (FLOMAX) capsule 0.4 mg  0.4 mg Oral QHS   ??? valACYclovir (VALTREX) tablet 500 mg  500 mg Oral DAILY   ??? escitalopram oxalate (LEXAPRO) tablet 10 mg  10 mg Oral DAILY   ??? polyethylene glycol (MIRALAX) packet 17 g  17 g Oral DAILY   ??? OLANZapine (ZyPREXA) tablet 5 mg  5 mg Oral Q6H PRN   ??? haloperidol lactate (HALDOL) injection 5 mg  5 mg IntraMUSCular Q6H PRN   ??? benztropine (COGENTIN) tablet 1 mg  1 mg Oral BID PRN   ??? diphenhydrAMINE (BENADRYL) injection 50 mg  50 mg IntraMUSCular BID PRN   ??? hydrOXYzine HCl (ATARAX) tablet 50 mg  50 mg Oral TID PRN   ??? LORazepam (ATIVAN) injection 1 mg  1 mg IntraMUSCular Q4H PRN   ??? traZODone (DESYREL) tablet 50 mg  50 mg Oral QHS PRN   ??? acetaminophen (TYLENOL) tablet 650 mg  650 mg Oral Q4H PRN   ??? magnesium hydroxide (MILK OF MAGNESIA) 400 mg/5 mL oral suspension 30 mL  30 mL Oral DAILY PRN      DISCUSSION:    the risks and benefits of the proposed medication  patient given opportunity to ask questions    Lab/Data Review:  All lab results for the last 24 hours reviewed.     Low platelets and WBC    Assessment:     Principal Problem:    Major depressive disorder (10/13/2018)        Plan:     Continue current care  Viral load and CD4 count still pending  Disposition planning with social work for tomorrow    Signed By: Lorie Phenix, MD     October 16, 2018

## 2018-10-16 NOTE — Progress Notes (Signed)
Diet as tolerated.  Hx remarkable for elev TAG (190) and BMI (29.53).  Ht: 5'9"  Wt: 200 lb  BMI: 29.53 kg/(m^2) c/w overweight  Est energy needs: 1845 kcal, 70 g protein, 1 mL/kcal fluids  Pt will consume > 75% of meals at follow up 7-10 days  LOS

## 2018-10-17 DIAGNOSIS — F333 Major depressive disorder, recurrent, severe with psychotic symptoms: Secondary | ICD-10-CM | POA: Diagnosis not present

## 2018-10-17 MED FILL — VALACYCLOVIR 500 MG TAB: 500 mg | ORAL | Qty: 1

## 2018-10-17 MED FILL — ACETAMINOPHEN 325 MG TABLET: 325 mg | ORAL | Qty: 2

## 2018-10-17 MED FILL — HEALTHYLAX 17 GRAM ORAL POWDER PACKET: 17 gram | ORAL | Qty: 1

## 2018-10-17 MED FILL — TAMSULOSIN SR 0.4 MG 24 HR CAP: 0.4 mg | ORAL | Qty: 1

## 2018-10-17 MED FILL — HYDROXYZINE 25 MG TAB: 25 mg | ORAL | Qty: 2

## 2018-10-17 MED FILL — BAYER LOW DOSE ASPIRIN 81 MG TABLET,DELAYED RELEASE: 81 mg | ORAL | Qty: 1

## 2018-10-17 MED FILL — BIKTARVY 50 MG-200 MG-25 MG TABLET: 50-200-25 mg | ORAL | Qty: 1

## 2018-10-17 MED FILL — POTASSIUM CHLORIDE SR 10 MEQ TAB: 10 mEq | ORAL | Qty: 2

## 2018-10-17 MED FILL — DIGOX 125 MCG (0.125 MG) TABLET: 125 mcg (0. mg) | ORAL | Qty: 1

## 2018-10-17 MED FILL — EPLERENONE 25 MG TAB: 25 mg | ORAL | Qty: 1

## 2018-10-17 MED FILL — ESCITALOPRAM 10 MG TAB: 10 mg | ORAL | Qty: 1

## 2018-10-17 MED FILL — METOPROLOL SUCCINATE SR 50 MG 24 HR TAB: 50 mg | ORAL | Qty: 1

## 2018-10-17 MED FILL — TRAZODONE 50 MG TAB: 50 mg | ORAL | Qty: 1

## 2018-10-17 MED FILL — FUROSEMIDE 40 MG TAB: 40 mg | ORAL | Qty: 1

## 2018-10-17 NOTE — Progress Notes (Signed)
Patient calm and co-operative. Seems to be having a better insight to this mental condition and managing his behavior more appropriately.

## 2018-10-17 NOTE — Progress Notes (Signed)
Problem: Suicide  Goal: *STG: Remains safe in hospital  Outcome: Progressing Towards Goal

## 2018-10-17 NOTE — Behavioral Health Treatment Team (Signed)
 Behavioral Health Transition Record to Provider    Patient Name: Jerry [name]  Date of Birth: 11-05-1961  Medical Record Number: [id number]  Date of Admission: 10/12/2018  Date of Discharge: 10/17/2018    Attending Provider: Layman Manus NOVAK, MD  Discharging Provider: Layman Manus NOVAK, MD  To contact this individual call [phone] and ask the operator to page.  If unavailable, ask to be transferred to [facility] Provider on call.  A Behavioral Health Provider will be available on call 24/7 and during holidays.    Primary Care Provider: Other, Phys, MD    No Known Allergies    Reason for Admission: The patient is a 57 year old male who is currently admitted at [facility] Unit on a voluntary basis.  He is a  transfer from [facility] Emergency Department.  He presented to the emergency room with suicidal thoughts with a plant to run in front of a truck.  States that he has been having thoughts of hurting himself for the last few days and he could not commit to safety.  He also started having auditory hallucinations last Friday.  Voices are telling him to kill himself and to jump in front of a truck. He states that he also hears his mother's voice. He is currently homeless after he lost his apartment in [place], [place] , and had to move back to [place].  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  He states that he has been feeling depressed and anxious because of overwhelming stressors, especially of not having money and no place to stay.  He is HIV positive.  His viral load is undetectable and has a high CD4 count.  He states he has been complaint with his medications.  He states that his suicidal ideations do come and go.  Had suicidal thoughts earlier, but denies it currently during the interview.  He denies homicidal ideation or auditory or visual hallucination.    Admission Diagnosis: Major  depressive disorder [F32.9]    * No surgery found *    Results for orders placed or performed during the hospital encounter of 10/12/18   METABOLIC PANEL, COMPREHENSIVE   Result Value Ref Range    Sodium 140 136 - 145 mmol/L    Potassium 3.9 3.5 - 5.1 mmol/L    Chloride 108 97 - 108 mmol/L    CO2 25 21 - 32 mmol/L    Anion gap 7 5 - 15 mmol/L    Glucose 94 65 - 100 mg/dL    BUN 17 6 - 20 MG/DL    Creatinine 8.83 9.29 - 1.30 MG/DL    BUN/Creatinine ratio 15 12 - 20      GFR est AA >60 >60 ml/min/1.10m2    GFR est non-AA >60 >60 ml/min/1.58m2    Calcium 8.5 8.5 - 10.1 MG/DL    Bilirubin, total 0.6 0.2 - 1.0 MG/DL    ALT (SGPT) 22 12 - 78 U/L    AST (SGOT) 17 15 - 37 U/L    Alk. phosphatase 45 45 - 117 U/L    Protein, total 6.0 (L) 6.4 - 8.2 g/dL    Albumin 3.1 (L) 3.5 - 5.0 g/dL    Globulin 2.9 2.0 - 4.0 g/dL    A-G Ratio 1.1 1.1 - 2.2     CBC W/O DIFF   Result Value Ref Range    WBC 2.3 (L) 4.1 - 11.1  K/uL    RBC 4.90 4.10 - 5.70 M/uL    HGB 16.0 12.1 - 17.0 g/dL    HCT 54.8 63.3 - 49.6 %    MCV 92.0 80.0 - 99.0 FL    MCH 32.7 26.0 - 34.0 PG    MCHC 35.5 30.0 - 36.5 g/dL    RDW 87.0 88.4 - 85.4 %    PLATELET 147 (L) 150 - 400 K/uL    MPV 9.5 8.9 - 12.9 FL    NRBC 0.0 0 PER 100 WBC    ABSOLUTE NRBC 0.00 0.00 - 0.01 K/uL   TSH 3RD GENERATION   Result Value Ref Range    TSH 0.29 (L) 0.36 - 3.74 uIU/mL   LIPID PANEL   Result Value Ref Range    LIPID PROFILE          Cholesterol, total 162 <200 MG/DL    Triglyceride 809 (H) <150 MG/DL    HDL Cholesterol 38 MG/DL    LDL, calculated 86 0 - 100 MG/DL    VLDL, calculated 38 MG/DL    CHOL/HDL Ratio 4.3 0.0 - 5.0     HIV-1 RNA QT BY PCR   Result Value Ref Range    HIV-1 RNA by PCR <20 copies/mL    HIV-1 RNA log10 Copies/mL TEST NOT PERFORMED log10copy/mL   LYMPHOCYTES, CD4 PERCENT AND ABSOLUTE   Result Value Ref Range    Abs CD4 Helper 585 359 - 1,519 /uL    % CD 4 Pos Lymph 45.0 30.8 - 58.5 %    WBC 2.2 (L) 3.4 - 10.8 x10E3/uL    RBC 5.31 4.14 - 5.80 x10E6/uL    HGB 17.0 13.0  - 17.7 g/dL    HCT 51.5 62.4 - 48.9 %    MCV 91 79 - 97 fL    MCH 32.0 26.6 - 33.0 pg    MCHC 35.1 31.5 - 35.7 g/dL    RDW 85.8 87.6 - 84.5 %    PLATELET 161 150 - 450 x10E3/uL    NEUTROPHILS 29 Not Estab. %    Lymphocytes 57 Not Estab. %    MONOCYTES 11 Not Estab. %    EOSINOPHILS 1 Not Estab. %    BASOPHILS 1 Not Estab. %    ABS. NEUTROPHILS 0.7 (L) 1.4 - 7.0 x10E3/uL    Abs Lymphocytes 1.3 0.7 - 3.1 x10E3/uL    ABS. MONOCYTES 0.3 0.1 - 0.9 x10E3/uL    ABS. EOSINOPHILS 0.0 0.0 - 0.4 x10E3/uL    ABS. BASOPHILS 0.0 0.0 - 0.2 x10E3/uL    IMMATURE GRANULOCYTES 1 Not Estab. %    ABS. IMM. GRANS. 0.0 0.0 - 0.1 X10E3/uL    Hematology comments: Note:        Immunizations administered during this encounter:   There is no immunization history on file for this patient.    Screening for Metabolic Disorders for Patients on Antipsychotic Medications  (Data obtained from the EMR)    Estimated Body Mass Index  Estimated body mass index is 29.53 kg/m as calculated from the following:    Height as of this encounter: 5' 9 (1.753 m).    Weight as of this encounter: 90.7 kg (200 lb).     Vital Signs/Blood Pressure  Visit Vitals  BP 121/76 (BP 1 Location: Left arm, BP Patient Position: Sitting)   Pulse 84   Temp 97.2 F (36.2 C)   Resp 17   Ht 5' 9 (1.753 m)   Wt 90.7 kg (  200 lb)   SpO2 100%   BMI 29.53 kg/m       Blood Glucose/Hemoglobin A1c  Lab Results   Component Value Date/Time    Glucose 94 10/13/2018 05:25 AM       No results found for: HBA1C, HGBE8, HBA1CEXT     Lipid Panel  Lab Results   Component Value Date/Time    Cholesterol, total 162 10/13/2018 05:25 AM    HDL Cholesterol 38 10/13/2018 05:25 AM    LDL, calculated 86 10/13/2018 05:25 AM    Triglyceride 190 (H) 10/13/2018 05:25 AM    CHOL/HDL Ratio 4.3 10/13/2018 05:25 AM        Discharge Diagnosis: Major depressive disorder (ICD-10-CM: F32.9)    Discharge Plan: Patient discharged to [facility] to get a train to [place], [place].  DISCHARGE  SUMMARY    NAME:Verle Otoniel Myhand  DOB: 1961/05/26  MRN: [id number]    The patient Kyjuan Gause exhibits the ability to control behavior in a less restrictive environment.  Patient's level of functioning is improving.  No assaultive/destructive behavior has been observed for the past 24 hours.  No suicidal/homicidal threat or behavior has been observed for the past 24 hours.  There is no evidence of serious medication side effects.  Patient has not been in physical or protective restraints for at least the past 24 hours.    If weapons involved, how are they secured?  No weapons involved.    Is patient aware of and in agreement with discharge plan?  Yes    Arrangements for medication:  Prescriptions given to patient.     Copy of discharge instructions to provider?:  Patient to bring physical discharge paperwork to provider.    Arrangements for transportation home:  Medicaid taxi    Keep all follow up appointments as scheduled, continue to take prescribed medications per physician instructions.  Mental health crisis number:  911 or your local mental health crisis line number at [phone].    Discharge Medication List and Instructions:   Discharge Medication List as of 10/17/2018 10:31 AM      START taking these medications    Details   escitalopram oxalate (LEXAPRO) 10 mg tablet Take 1 Tab by mouth daily. Indications: Depression, Print, Disp-30 Tab, R-0      traZODone (DESYREL) 50 mg tablet Take 1 Tab by mouth nightly as needed for Sleep (For insomnia). Indications: insomnia associated with depression, Print, Disp-30 Tab, R-0      polyethylene glycol (MIRALAX) 17 gram packet Take 1 Packet by mouth daily. Indications: constipation, Print, Disp-30 Packet, R-0         CONTINUE these medications which have CHANGED    Details   metoprolol succinate (TOPROL-XL) 50 mg XL tablet Take 1 Tab by mouth nightly. Indications: high blood pressure, Print, Disp-30 Tab, R-0      hydrOXYzine HCl (ATARAX) 50 mg tablet Take  1 Tab by mouth three (3) times daily as needed for Anxiety for up to 10 days. Indications: anxious, Print, Disp-90 Tab, R-0         CONTINUE these medications which have NOT CHANGED    Details   hydroxypropyl methylcellulose (ISOPTO TEARS) 0.5 % ophthalmic solution Administer 2 Drops to both eyes daily. Indications: dry eye, Historical Med      olopatadine (PATANOL) 0.1 % ophthalmic solution Administer 1 Drop to both eyes two (2) times daily as needed for Allergies., Historical Med      albuterol (VENTOLIN HFA) 90 mcg/actuation  inhaler Take 2 Puffs by inhalation every six (6) hours as needed for Shortness of Breath., Historical Med      digoxin (LANOXIN) 0.125 mg tablet Take 0.125 mg by mouth daily. Indications: Ventricular Rate Control in Atrial Fibrillation, Historical Med      eplerenone (INSPRA) 25 mg tablet Take 12.5 mg by mouth daily. Indications: Heart Failure, Historical Med      bictegrav-emtricit-tenofov ala (BIKTARVY) tab tablet Take 1 Tab by mouth daily. Indications: HIV, Historical Med      valACYclovir (VALTREX) 500 mg tablet Take 500 mg by mouth daily., Historical Med      potassium chloride (K-DUR, KLOR-CON) 20 mEq tablet Take 20 mEq by mouth daily (with breakfast). Indications: prevention of low potassium in the blood, Historical Med      tamsulosin (FLOMAX) 0.4 mg capsule Take 0.4 mg by mouth nightly., Historical Med      sildenafil citrate (VIAGRA) 50 mg tablet Take 50 mg by mouth daily as needed. Indications: the inability to have an erection, Historical Med      furosemide (LASIX) 40 mg tablet Take 40 mg by mouth daily. Indications: visible water retention, Historical Med      aspirin delayed-release 81 mg tablet Take 81 mg by mouth daily. Indications: treatment to prevent a heart attack, Historical Med         STOP taking these medications       HYDROcodone-acetaminophen (NORCO) 5-325 mg per tablet Comments:   Reason for Stopping:         losartan (COZAAR) 50 mg tablet Comments:   Reason for  Stopping:               Unresulted Labs (24h ago, onward)    None        To obtain results of studies pending at discharge, please contact [phone]    Follow-up Information     Follow up With Specialties Details Why Contact Info    Other, Phys, MD    Patient can only remember the practice name and not the physician      [facility]  Schedule an appointment as soon as possible for a visit Schedule an appointment for individual therapy and medication management upon arrival in [place]. [address]  [place], [place]  [address]  [phone]          Advanced Directive:   Does the patient have an appointed surrogate decision maker? No  Does the patient have a Medical Advance Directive? No  Does the patient have a Psychiatric Advance Directive? No  If the patient does not have a surrogate or Medical Advance Directive AND Psychiatric Advance Directive, the patient was offered information on these advance directives Yes and Patient declined to complete    Patient Instructions: Please continue all medications until otherwise directed by physician.      Tobacco Cessation Discharge Plan:   Is the patient a smoker and needs referral for smoking cessation? Yes  Patient referred to the following for smoking cessation with an appointment? Refused     Patient was offered medication to assist with smoking cessation at discharge? Refused  Was education for smoking cessation added to the discharge instructions? Yes    Alcohol/Substance Abuse Discharge Plan:   Does the patient have a history of substance/alcohol abuse and requires a referral for treatment? No  Patient referred to the following for substance/alcohol abuse treatment with an appointment? Not applicable  Patient was offered medication to assist with alcohol  cessation at discharge? Not applicable  Was education for substance/alcohol abuse added to discharge instructions? No    Patient discharged to Home; discussed with patient/caregiver and  provided to the patient/caregiver either in hard copy or electronically.     PATIENT WILL BRING PHYSICAL COPY OF DISCHARGE PAPERWORK TO FOLLOW-UP APPOINTMENT.

## 2018-10-17 NOTE — Behavioral Health Treatment Team (Signed)
Patient without complaint this shift. Medication compliant. Observed in bed resting quietly with eyes closed. No distress noted. Slept approx 8 hours.

## 2018-10-17 NOTE — Discharge Summary (Signed)
Some parts of the discharge summary are from the initial Psychiatric interview that was done on admission by the admitting psychiatrist.      Date of Admission: 10/12/2018    Date of Discharge:10/17/2018     TYPE OF DISCHARGE:   REGULAR     History of Present Illness  The patient is a 57 year old male who is currently admitted at [facility] Unit on a voluntary basis.  He is a  transfer from [facility] Emergency Department.  He presented to the emergency room with suicidal thoughts with a plant to run in front of a truck.  States that he has been having thoughts of hurting himself for the last few days and he could not commit to safety.  He also started having auditory hallucinations last Friday.  Voices are telling him to kill himself and to jump in front of a truck. He states that he also hears his mother's voice. He is currently homeless after he lost his apartment in [place], [place], and had to move back to [place].  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  He states that he has been feeling depressed and anxious because of overwhelming stressors, especially of not having money and no place to stay.  He is HIV positive.  His viral load is undetectable and has a high CD4 count.  He states he has been complaint with his medications.  He states that his suicidal ideations do come and go.  Had suicidal thoughts earlier, but denies it currently during the interview.  He denies homicidal ideation or auditory or visual hallucination.    Course in the Hospital:     Patient was admitted to the inpatient psychiatry unit for acute psychiatric stabilization in regards to symptomatology as described in the HPI above and placed on Q15 minute checks. While on the unit Jerry [name] was involved in individual, group, occupational and milieu therapy. He was started back on his usual medication regimen as well as PRN  medications including lexapro and hydroxyzine  . He improved gradually and was able to integrate into the milieu with help from the nursing staff. Patients symptoms improved gradually including    . He was quiet on the unit, appropriate in his interactions, and cooperative with medications and the unit routine. Please see individual progress notes for more specific details regarding patient's hospitalization course. Patient was discharged as per the plan. He had been doing well on the unit as per the report of the nursing staff and my observations. No PRN medication for agitation, seclusion or restraints were required during the last 48 hours of her stay. Jerry [name] had improved progressively to the point of being stable for discharge and outpatient FU. At this time he did not offer any complaints. Patient denied any SI or HI. Denied any AH or VH. He denied any delusions. Was not considered a danger to self or to others and is safe for discharge. Will FU with his appointments and remains motivated to be in treatment. The patient verbalized understanding of his discharge instructions.        DISCHARGE DIAGNOSIS:  Major Depressive Disorder, recurrent        Discharge Medication List as of 10/17/2018 10:31 AM      START taking these medications    Details   escitalopram oxalate (LEXAPRO) 10 mg tablet Take 1 Tab by mouth daily. Indications: Depression, Print,  Disp-30 Tab, R-0      traZODone (DESYREL) 50 mg tablet Take 1 Tab by mouth nightly as needed for Sleep (For insomnia). Indications: insomnia associated with depression, Print, Disp-30 Tab, R-0      polyethylene glycol (MIRALAX) 17 gram packet Take 1 Packet by mouth daily. Indications: constipation, Print, Disp-30 Packet, R-0         CONTINUE these medications which have CHANGED    Details   metoprolol succinate (TOPROL-XL) 50 mg XL tablet Take 1 Tab by mouth nightly. Indications: high blood pressure, Print, Disp-30 Tab, R-0      hydrOXYzine HCl (ATARAX)  50 mg tablet Take 1 Tab by mouth three (3) times daily as needed for Anxiety for up to 10 days. Indications: anxious, Print, Disp-90 Tab, R-0         CONTINUE these medications which have NOT CHANGED    Details   hydroxypropyl methylcellulose (ISOPTO TEARS) 0.5 % ophthalmic solution Administer 2 Drops to both eyes daily. Indications: dry eye, Historical Med      olopatadine (PATANOL) 0.1 % ophthalmic solution Administer 1 Drop to both eyes two (2) times daily as needed for Allergies., Historical Med      albuterol (VENTOLIN HFA) 90 mcg/actuation inhaler Take 2 Puffs by inhalation every six (6) hours as needed for Shortness of Breath., Historical Med      digoxin (LANOXIN) 0.125 mg tablet Take 0.125 mg by mouth daily. Indications: Ventricular Rate Control in Atrial Fibrillation, Historical Med      eplerenone (INSPRA) 25 mg tablet Take 12.5 mg by mouth daily. Indications: Heart Failure, Historical Med      bictegrav-emtricit-tenofov ala (BIKTARVY) tab tablet Take 1 Tab by mouth daily. Indications: HIV, Historical Med      valACYclovir (VALTREX) 500 mg tablet Take 500 mg by mouth daily., Historical Med      potassium chloride (K-DUR, KLOR-CON) 20 mEq tablet Take 20 mEq by mouth daily (with breakfast). Indications: prevention of low potassium in the blood, Historical Med      tamsulosin (FLOMAX) 0.4 mg capsule Take 0.4 mg by mouth nightly., Historical Med      sildenafil citrate (VIAGRA) 50 mg tablet Take 50 mg by mouth daily as needed. Indications: the inability to have an erection, Historical Med      furosemide (LASIX) 40 mg tablet Take 40 mg by mouth daily. Indications: visible water retention, Historical Med      aspirin delayed-release 81 mg tablet Take 81 mg by mouth daily. Indications: treatment to prevent a heart attack, Historical Med         STOP taking these medications       HYDROcodone-acetaminophen (NORCO) 5-325 mg per tablet Comments:   Reason for Stopping:         losartan (COZAAR) 50 mg tablet  Comments:   Reason for Stopping:                Follow-up Information     Follow up With Specialties Details Why Contact Info    Other, Phys, MD    Patient can only remember the practice name and not the physician      [facility]  Schedule an appointment as soon as possible for a visit Schedule an appointment for individual therapy and medication management upon arrival in [place]. [address]  [place], [place]  [address]  [phone]        WOUND CARE: none needed.    PROGNOSIS:   Good /  Fair based on nature of patient's pathology/ies and treatment compliance issues.  Prognosis is greatly dependent upon patient's ability to  follow up on psychiatric/psychotherapy appointments as well as to comply with psychiatric medications as prescribed.

## 2018-10-17 NOTE — Behavioral Health Treatment Team (Signed)
0800-Patient resting in bed, physical assessment completed. Patient denies SI, HI or feeling depressed.Patient hopeful about going home today  0900- Tolerated 0800 am medications and breadkfast. Tylenol 650 mg given for lower back pain.  1100-Medications supplied to patient while here (provair, artifical tears) pulled and will be returned to the patient.Other medications brought in from home picked up from pharmacy and returned to patient. Security returned valuables returned to patient. Patient waiting for medicaid taxi.

## 2018-10-17 NOTE — Progress Notes (Incomplete)
Patient is being discharge home. He is currently without inappropriate behavior.

## 2018-10-17 NOTE — Behavioral Health Treatment Team (Signed)
0800-Patient resting in bed, physical assessment completed. Patient denies SI, HI or feeling depressed.Patient hopeful about going home today  0900- Tolerated 0800 am medications and breadkfast. Tylenol 650 mg given for lower back pain.  1100-Medications supplied to patient while here (provair, artifical tears) pulled and will be returned to the patient.Other medications brought in from home picked up from pharmacy and returned to patient. Security returned valuables returned to patient. Patient waiting for medicaid taxi.

## 2018-10-17 NOTE — Progress Notes (Signed)
Patient calm and co-operative. Seems to be having a better insight to this mental condition and managing his behavior more appropriately.

## 2018-10-17 NOTE — Behavioral Health Treatment Team (Signed)
Behavioral Health Transition Record to Provider    Patient Name: Jerry [name]  Date of Birth: 1961-07-22  Medical Record Number: [id number]  Date of Admission: 10/12/2018  Date of Discharge: 10/17/2018    Attending Provider: Vladimir Crofts, MD  Discharging Provider: Vladimir Crofts, MD  To contact this individual call [phone] and ask the operator to page.  If unavailable, ask to be transferred to [facility] Provider on call.  [place] Provider will be available on call 24/7 and during holidays.    Primary Care Provider: Other, Phys, MD    No Known Allergies    Reason for Admission: The patient is a 57 year old male who is currently admitted at [facility] on a voluntary basis.  He is a  transfer from [facility].  He presented to the emergency room with suicidal thoughts with a plant to run in front of a truck.  States that he has been having thoughts of hurting himself for the last few days and he could not commit to safety.  He also started having auditory hallucinations last Friday.  Voices are telling him to kill himself and to jump in front of a truck. He states that he also hears his mother's voice. He is currently homeless after he lost his apartment in [place], [place], and had to move back to [employer].  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  He states that he has been feeling depressed and anxious because of overwhelming stressors, especially of not having money and no place to stay.  He is HIV positive.  His viral load is undetectable and has a high CD4 count.  He states he has been complaint with his medications.  He states that his suicidal ideations do come and go.  Had suicidal thoughts earlier, but denies it currently during the interview.  He denies homicidal ideation or auditory or visual hallucination.     Admission Diagnosis: Major depressive disorder [F32.9]    * No surgery found *    Results for orders placed or performed during the hospital encounter of 15/40/08   METABOLIC PANEL, COMPREHENSIVE   Result Value Ref Range    Sodium 140 136 - 145 mmol/L    Potassium 3.9 3.5 - 5.1 mmol/L    Chloride 108 97 - 108 mmol/L    CO2 25 21 - 32 mmol/L    Anion gap 7 5 - 15 mmol/L    Glucose 94 65 - 100 mg/dL    BUN 17 6 - 20 MG/DL    Creatinine 1.16 0.70 - 1.30 MG/DL    BUN/Creatinine ratio 15 12 - 20      GFR est AA >60 >60 ml/min/1.67m    GFR est non-AA >60 >60 ml/min/1.776m   Calcium 8.5 8.5 - 10.1 MG/DL    Bilirubin, total 0.6 0.2 - 1.0 MG/DL    ALT (SGPT) 22 12 - 78 U/L    AST (SGOT) 17 15 - 37 U/L    Alk. phosphatase 45 45 - 117 U/L    Protein, total 6.0 (L) 6.4 - 8.2 g/dL    Albumin 3.1 (L) 3.5 - 5.0 g/dL    Globulin 2.9 2.0 - 4.0 g/dL    A-G Ratio 1.1 1.1 - 2.2     CBC W/O DIFF   Result Value Ref Range    WBC 2.3 (L) 4.1 - 11.1  K/uL    RBC 4.90 4.10 - 5.70 M/uL    HGB 16.0 12.1 - 17.0 g/dL    HCT 45.1 36.6 - 50.3 %    MCV 92.0 80.0 - 99.0 FL    MCH 32.7 26.0 - 34.0 PG    MCHC 35.5 30.0 - 36.5 g/dL    RDW 12.9 11.5 - 14.5 %    PLATELET 147 (L) 150 - 400 K/uL    MPV 9.5 8.9 - 12.9 FL    NRBC 0.0 0 PER 100 WBC    ABSOLUTE NRBC 0.00 0.00 - 0.01 K/uL   TSH 3RD GENERATION   Result Value Ref Range    TSH 0.29 (L) 0.36 - 3.74 uIU/mL   LIPID PANEL   Result Value Ref Range    LIPID PROFILE          Cholesterol, total 162 <200 MG/DL    Triglyceride 190 (H) <150 MG/DL    HDL Cholesterol 38 MG/DL    LDL, calculated 86 0 - 100 MG/DL    VLDL, calculated 38 MG/DL    CHOL/HDL Ratio 4.3 0.0 - 5.0     HIV-1 RNA QT BY PCR   Result Value Ref Range    HIV-1 RNA by PCR <20 copies/mL    HIV-1 RNA log10 Copies/mL TEST NOT PERFORMED log10copy/mL   LYMPHOCYTES, CD4 PERCENT AND ABSOLUTE   Result Value Ref Range    Abs CD4 Helper 585 359 - 1,519 /uL    % CD 4 Pos Lymph 45.0 30.8 - 58.5 %    WBC 2.2 (L) 3.4 - 10.8 x10E3/uL     RBC 5.31 4.14 - 5.80 x10E6/uL    HGB 17.0 13.0 - 17.7 g/dL    HCT 48.4 37.5 - 51.0 %    MCV 91 79 - 97 fL    MCH 32.0 26.6 - 33.0 pg    MCHC 35.1 31.5 - 35.7 g/dL    RDW 14.1 12.3 - 15.4 %    PLATELET 161 150 - 450 x10E3/uL    NEUTROPHILS 29 Not Estab. %    Lymphocytes 57 Not Estab. %    MONOCYTES 11 Not Estab. %    EOSINOPHILS 1 Not Estab. %    BASOPHILS 1 Not Estab. %    ABS. NEUTROPHILS 0.7 (L) 1.4 - 7.0 x10E3/uL    Abs Lymphocytes 1.3 0.7 - 3.1 x10E3/uL    ABS. MONOCYTES 0.3 0.1 - 0.9 x10E3/uL    ABS. EOSINOPHILS 0.0 0.0 - 0.4 x10E3/uL    ABS. BASOPHILS 0.0 0.0 - 0.2 x10E3/uL    IMMATURE GRANULOCYTES 1 Not Estab. %    ABS. IMM. GRANS. 0.0 0.0 - 0.1 X10E3/uL    Hematology comments: Note:        Immunizations administered during this encounter:   There is no immunization history on file for this patient.    Screening for Metabolic Disorders for Patients on Antipsychotic Medications  (Data obtained from the EMR)    Estimated Body Mass Index  Estimated body mass index is 29.53 kg/m?? as calculated from the following:    Height as of this encounter: _0  (1.753 m).    Weight as of this encounter: 90.7 kg (200 lb).     Vital Signs/Blood Pressure  Visit Vitals  BP 121/76 (BP 1 Location: Left arm, BP Patient Position: Sitting)   Pulse 84   Temp 97.2 ??F (36.2 ??C)   Resp 17   Ht _1  (1.753 m)   Wt 90.7 kg (  200 lb)   SpO2 100%   BMI 29.53 kg/m??       Blood Glucose/Hemoglobin A1c  Lab Results   Component Value Date/Time    Glucose 94 10/13/2018 05:25 AM       No results found for: HBA1C, HGBE8, HBA1CEXT     Lipid Panel  Lab Results   Component Value Date/Time    Cholesterol, total 162 10/13/2018 05:25 AM    HDL Cholesterol 38 10/13/2018 05:25 AM    LDL, calculated 86 10/13/2018 05:25 AM    Triglyceride 190 (H) 10/13/2018 05:25 AM    CHOL/HDL Ratio 4.3 10/13/2018 05:25 AM        Discharge Diagnosis: Major depressive disorder (ICD-10-CM: F32.9)    Discharge Plan: Patient discharged to [place] to get a  train to [place], [place]    NAME:Jerry [name]  DOB: 1961-10-07  MRN: [id number]    The patient Jerry [name] exhibits the ability to control behavior in a less restrictive environment.  Patient's level of functioning is improving.  No assaultive/destructive behavior has been observed for the past 24 hours.  No suicidal/homicidal threat or behavior has been observed for the past 24 hours.  There is no evidence of serious medication side effects.  Patient has not been in physical or protective restraints for at least the past 24 hours.    If weapons involved, how are they secured?  No weapons involved.    Is patient aware of and in agreement with discharge plan?  Yes    Arrangements for medication:  Prescriptions given to patient.     Copy of discharge instructions to provider?:  Patient to bring physical discharge paperwork to provider.    Arrangements for transportation home:  Medicaid taxi    Keep all follow up appointments as scheduled, continue to take prescribed medications per physician instructions.  Mental health crisis number:  332 or your local mental health crisis line number at [phone].    Discharge Medication List and Instructions:   Discharge Medication List as of 10/17/2018 10:31 AM      START taking these medications    Details   escitalopram oxalate (LEXAPRO) 10 mg tablet Take 1 Tab by mouth daily. Indications: Depression, Print, Disp-30 Tab, R-0      traZODone (DESYREL) 50 mg tablet Take 1 Tab by mouth nightly as needed for Sleep (For insomnia). Indications: insomnia associated with depression, Print, Disp-30 Tab, R-0      polyethylene glycol (MIRALAX) 17 gram packet Take 1 Packet by mouth daily. Indications: constipation, Print, Disp-30 Packet, R-0         CONTINUE these medications which have CHANGED    Details   metoprolol succinate (TOPROL-XL) 50 mg XL tablet Take 1 Tab by mouth nightly. Indications: high blood pressure, Print, Disp-30 Tab, R-0       hydrOXYzine HCl (ATARAX) 50 mg tablet Take 1 Tab by mouth three (3) times daily as needed for Anxiety for up to 10 days. Indications: anxious, Print, Disp-90 Tab, R-0         CONTINUE these medications which have NOT CHANGED    Details   hydroxypropyl methylcellulose (ISOPTO TEARS) 0.5 % ophthalmic solution Administer 2 Drops to both eyes daily. Indications: dry eye, Historical Med      olopatadine (PATANOL) 0.1 % ophthalmic solution Administer 1 Drop to both eyes two (2) times daily as needed for Allergies., Historical Med      albuterol (VENTOLIN HFA) 90 mcg/actuation  inhaler Take 2 Puffs by inhalation every six (6) hours as needed for Shortness of Breath., Historical Med      digoxin (LANOXIN) 0.125 mg tablet Take 0.125 mg by mouth daily. Indications: Ventricular Rate Control in Atrial Fibrillation, Historical Med      eplerenone (INSPRA) 25 mg tablet Take 12.5 mg by mouth daily. Indications: Heart Failure, Historical Med      bictegrav-emtricit-tenofov ala (BIKTARVY) tab tablet Take 1 Tab by mouth daily. Indications: HIV, Historical Med      valACYclovir (VALTREX) 500 mg tablet Take 500 mg by mouth daily., Historical Med      potassium chloride (K-DUR, KLOR-CON) 20 mEq tablet Take 20 mEq by mouth daily (with breakfast). Indications: prevention of low potassium in the blood, Historical Med      tamsulosin (FLOMAX) 0.4 mg capsule Take 0.4 mg by mouth nightly., Historical Med      sildenafil citrate (VIAGRA) 50 mg tablet Take 50 mg by mouth daily as needed. Indications: the inability to have an erection, Historical Med      furosemide (LASIX) 40 mg tablet Take 40 mg by mouth daily. Indications: visible water retention, Historical Med      aspirin delayed-release 81 mg tablet Take 81 mg by mouth daily. Indications: treatment to prevent a heart attack, Historical Med         STOP taking these medications       HYDROcodone-acetaminophen (NORCO) 5-325 mg per tablet Comments:   Reason for Stopping:          losartan (COZAAR) 50 mg tablet Comments:   Reason for Stopping:               Unresulted Labs (24h ago, onward)    None        To obtain results of studies pending at discharge, please contact [phone]    Follow-up Information     Follow up With Specialties Details Why Contact Info    Other, Phys, MD    Patient can only remember the practice name and not the physician      [facility]  Schedule an appointment as soon as possible for a visit Schedule an appointment for individual therapy and medication management upon arrival in [place]. [address] [place]  [address]  [phone]          Advanced Directive:   Does the patient have an appointed surrogate decision maker? No  Does the patient have a Medical Advance Directive? No  Does the patient have a Psychiatric Advance Directive? No  If the patient does not have a surrogate or Medical Advance Directive AND Psychiatric Advance Directive, the patient was offered information on these advance directives Yes and Patient declined to complete    Patient Instructions: Please continue all medications until otherwise directed by physician.      Tobacco Cessation Discharge Plan:   Is the patient a smoker and needs referral for smoking cessation? Yes  Patient referred to the following for smoking cessation with an appointment? Refused     Patient was offered medication to assist with smoking cessation at discharge? Refused  Was education for smoking cessation added to the discharge instructions? Yes    Alcohol/Substance Abuse Discharge Plan:   Does the patient have a history of substance/alcohol abuse and requires a referral for treatment? No  Patient referred to the following for substance/alcohol abuse treatment with an appointment? Not applicable  Patient was offered medication to assist with alcohol  cessation at discharge? Not applicable  Was education for substance/alcohol abuse added to discharge instructions? No     Patient discharged to Home; discussed with patient/caregiver and provided to the patient/caregiver either in hard copy or electronically.     PATIENT WILL BRING PHYSICAL COPY OF DISCHARGE PAPERWORK TO FOLLOW-UP APPOINTMENT.

## 2018-10-17 NOTE — Discharge Summary (Signed)
Some parts of the discharge summary are from the initial Psychiatric interview that was done on admission by the admitting psychiatrist.      Date of Admission: 10/12/2018    Date of Discharge:10/17/2018     TYPE OF DISCHARGE:   REGULAR     History of Present Illness  The patient is a 57 year old male who is currently admitted at [facility] on a voluntary basis.  He is a  transfer from [facility].  He presented to the emergency room with suicidal thoughts with a plant to run in front of a truck.  States that he has been having thoughts of hurting himself for the last few days and he could not commit to safety.  He also started having auditory hallucinations last Friday.  Voices are telling him to kill himself and to jump in front of a truck. He states that he also hears his mother's voice. He is currently homeless after he lost his apartment in [place], [place], and had to move back to [employer].  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  He states that he has been feeling depressed and anxious because of overwhelming stressors, especially of not having money and no place to stay.  He is HIV positive.  His viral load is undetectable and has a high CD4 count.  He states he has been complaint with his medications.  He states that his suicidal ideations do come and go.  Had suicidal thoughts earlier, but denies it currently during the interview.  He denies homicidal ideation or auditory or visual hallucination.    Course in the Hospital:     Patient was admitted to the inpatient psychiatry unit for acute psychiatric stabilization in regards to symptomatology as described in the HPI above and placed on Q15 minute checks. While on the unit Jerry [name] was involved in individual, group, occupational and milieu therapy. He was started back on his usual medication regimen as well as  PRN medications including lexapro and hydroxyzine  . He improved gradually and was able to integrate into the milieu with help from the nursing staff. Patients symptoms improved gradually including    . He was quiet on the unit, appropriate in his interactions, and cooperative with medications and the unit routine. Please see individual progress notes for more specific details regarding patient's hospitalization course. Patient was discharged as per the plan. He had been doing well on the unit as per the report of the nursing staff and my observations. No PRN medication for agitation, seclusion or restraints were required during the last 48 hours of her stay. Jerry [name] had improved progressively to the point of being stable for discharge and outpatient FU. At this time he did not offer any complaints. Patient denied any SI or HI. Denied any AH or VH. He denied any delusions. Was not considered a danger to self or to others and is safe for discharge. Will FU with his appointments and remains motivated to be in treatment. The patient verbalized understanding of his discharge instructions.        DISCHARGE DIAGNOSIS:  Major Depressive Disorder, recurrent        Discharge Medication List as of 10/17/2018 10:31 AM      START taking these medications    Details   escitalopram oxalate (LEXAPRO) 10 mg tablet Take 1 Tab by mouth daily. Indications: Depression, Print,  Disp-30 Tab, R-0      traZODone (DESYREL) 50 mg tablet Take 1 Tab by mouth nightly as needed for Sleep (For insomnia). Indications: insomnia associated with depression, Print, Disp-30 Tab, R-0      polyethylene glycol (MIRALAX) 17 gram packet Take 1 Packet by mouth daily. Indications: constipation, Print, Disp-30 Packet, R-0         CONTINUE these medications which have CHANGED    Details   metoprolol succinate (TOPROL-XL) 50 mg XL tablet Take 1 Tab by mouth nightly. Indications: high blood pressure, Print, Disp-30 Tab, R-0       hydrOXYzine HCl (ATARAX) 50 mg tablet Take 1 Tab by mouth three (3) times daily as needed for Anxiety for up to 10 days. Indications: anxious, Print, Disp-90 Tab, R-0         CONTINUE these medications which have NOT CHANGED    Details   hydroxypropyl methylcellulose (ISOPTO TEARS) 0.5 % ophthalmic solution Administer 2 Drops to both eyes daily. Indications: dry eye, Historical Med      olopatadine (PATANOL) 0.1 % ophthalmic solution Administer 1 Drop to both eyes two (2) times daily as needed for Allergies., Historical Med      albuterol (VENTOLIN HFA) 90 mcg/actuation inhaler Take 2 Puffs by inhalation every six (6) hours as needed for Shortness of Breath., Historical Med      digoxin (LANOXIN) 0.125 mg tablet Take 0.125 mg by mouth daily. Indications: Ventricular Rate Control in Atrial Fibrillation, Historical Med      eplerenone (INSPRA) 25 mg tablet Take 12.5 mg by mouth daily. Indications: Heart Failure, Historical Med      bictegrav-emtricit-tenofov ala (BIKTARVY) tab tablet Take 1 Tab by mouth daily. Indications: HIV, Historical Med      valACYclovir (VALTREX) 500 mg tablet Take 500 mg by mouth daily., Historical Med      potassium chloride (K-DUR, KLOR-CON) 20 mEq tablet Take 20 mEq by mouth daily (with breakfast). Indications: prevention of low potassium in the blood, Historical Med      tamsulosin (FLOMAX) 0.4 mg capsule Take 0.4 mg by mouth nightly., Historical Med      sildenafil citrate (VIAGRA) 50 mg tablet Take 50 mg by mouth daily as needed. Indications: the inability to have an erection, Historical Med      furosemide (LASIX) 40 mg tablet Take 40 mg by mouth daily. Indications: visible water retention, Historical Med      aspirin delayed-release 81 mg tablet Take 81 mg by mouth daily. Indications: treatment to prevent a heart attack, Historical Med         STOP taking these medications       HYDROcodone-acetaminophen (NORCO) 5-325 mg per tablet Comments:   Reason for Stopping:          losartan (COZAAR) 50 mg tablet Comments:   Reason for Stopping:                Follow-up Information     Follow up With Specialties Details Why Contact Info    Other, Phys, MD    Patient can only remember the practice name and not the physician      [facility]  Schedule an appointment as soon as possible for a visit Schedule an appointment for individual therapy and medication management upon arrival in [place]. [address]  [place], [place]  [address]  [phone]        WOUND CARE: none needed.    PROGNOSIS:   Good /  Fair based on nature of patient's pathology/ies and treatment compliance issues.  Prognosis is greatly dependent upon patient's ability to  follow up on psychiatric/psychotherapy appointments as well as to comply with psychiatric medications as prescribed.

## 2018-10-22 DIAGNOSIS — Z79899 Other long term (current) drug therapy: Secondary | ICD-10-CM

## 2018-10-22 DIAGNOSIS — F329 Major depressive disorder, single episode, unspecified: Secondary | ICD-10-CM | POA: Diagnosis not present

## 2018-10-22 DIAGNOSIS — R45851 Suicidal ideations: Secondary | ICD-10-CM | POA: Diagnosis not present

## 2018-10-22 HISTORY — DX: Other long term (current) drug therapy: Z79.899

## 2018-10-23 DIAGNOSIS — F17210 Nicotine dependence, cigarettes, uncomplicated: Secondary | ICD-10-CM | POA: Diagnosis not present

## 2018-10-23 DIAGNOSIS — B009 Herpesviral infection, unspecified: Secondary | ICD-10-CM | POA: Diagnosis present

## 2018-10-23 DIAGNOSIS — I10 Essential (primary) hypertension: Secondary | ICD-10-CM | POA: Diagnosis present

## 2018-10-23 DIAGNOSIS — Z590 Homelessness: Secondary | ICD-10-CM | POA: Diagnosis not present

## 2018-10-23 DIAGNOSIS — Z950 Presence of cardiac pacemaker: Secondary | ICD-10-CM | POA: Diagnosis not present

## 2018-10-23 DIAGNOSIS — Z21 Asymptomatic human immunodeficiency virus [HIV] infection status: Secondary | ICD-10-CM | POA: Diagnosis present

## 2018-10-23 DIAGNOSIS — I2510 Atherosclerotic heart disease of native coronary artery without angina pectoris: Secondary | ICD-10-CM | POA: Diagnosis present

## 2018-10-23 DIAGNOSIS — R45851 Suicidal ideations: Secondary | ICD-10-CM | POA: Diagnosis not present

## 2018-10-23 DIAGNOSIS — I509 Heart failure, unspecified: Secondary | ICD-10-CM | POA: Diagnosis present

## 2018-10-23 DIAGNOSIS — F332 Major depressive disorder, recurrent severe without psychotic features: Secondary | ICD-10-CM | POA: Diagnosis not present

## 2018-11-18 ENCOUNTER — Inpatient Hospital Stay: Payer: Self-pay | Admitting: Family Medicine

## 2018-11-20 ENCOUNTER — Inpatient Hospital Stay (HOSPITAL_COMMUNITY)
Admission: RE | Admit: 2018-11-20 | Discharge: 2018-11-20 | Disposition: A | Discharge status: Home / Self Care | Payer: Medicare Other | Source: Ambulatory Visit

## 2018-11-27 DIAGNOSIS — Z202 Contact with and (suspected) exposure to infections with a predominantly sexual mode of transmission: Secondary | ICD-10-CM | POA: Diagnosis not present

## 2018-11-27 DIAGNOSIS — R21 Rash and other nonspecific skin eruption: Secondary | ICD-10-CM | POA: Diagnosis not present

## 2018-12-04 ENCOUNTER — Other Ambulatory Visit: Payer: Self-pay

## 2018-12-06 ENCOUNTER — Other Ambulatory Visit: Payer: Self-pay

## 2018-12-06 ENCOUNTER — Other Ambulatory Visit: Payer: Medicare Other

## 2018-12-06 ENCOUNTER — Other Ambulatory Visit (HOSPITAL_COMMUNITY)
Admission: RE | Admit: 2018-12-06 | Discharge: 2018-12-06 | Disposition: A | Discharge status: Home / Self Care | Payer: Medicare Other | Source: Ambulatory Visit | Attending: Infectious Disease | Admitting: Infectious Disease

## 2018-12-06 DIAGNOSIS — Z113 Encounter for screening for infections with a predominantly sexual mode of transmission: Secondary | ICD-10-CM | POA: Insufficient documentation

## 2018-12-06 DIAGNOSIS — B20 Human immunodeficiency virus [HIV] disease: Secondary | ICD-10-CM

## 2018-12-06 LAB — T-HELPER CELL (CD4) - (RCID CLINIC ONLY)
CD4 % Helper T Cell: 38 % (ref 33–55)
CD4 T CELL ABS: 420 /uL (ref 400–2700)

## 2018-12-09 LAB — URINE CYTOLOGY ANCILLARY ONLY
Chlamydia: NEGATIVE
Neisseria Gonorrhea: NEGATIVE

## 2018-12-10 LAB — HIV-1 RNA QUANT-NO REFLEX-BLD
HIV 1 RNA Quant: <20 copies/mL — AB
HIV-1 RNA QUANT, LOG: DETECTED {Log_copies}/mL — AB

## 2018-12-10 LAB — COMPLETE METABOLIC PANEL WITH GFR
AG RATIO: 1.7 (calc) (ref 1.0–2.5)
ALT: 23 U/L (ref 9–46)
AST: 21 U/L (ref 10–35)
Albumin: 4.3 g/dL (ref 3.6–5.1)
Alkaline phosphatase (APISO): 48 U/L (ref 40–115)
BUN: 18 mg/dL (ref 7–25)
CALCIUM: 9.5 mg/dL (ref 8.6–10.3)
CO2: 25 mmol/L (ref 20–32)
Chloride: 109 mmol/L (ref 98–110)
Creat: 1.27 mg/dL (ref 0.70–1.33)
GFR, EST NON AFRICAN AMERICAN: 62 mL/min/{1.73_m2} (ref >=60)
GFR, Est African American: 72 mL/min/{1.73_m2} (ref >=60)
GLOBULIN: 2.5 g/dL (ref 1.9–3.7)
Glucose, Bld: 105 mg/dL — ABNORMAL HIGH (ref 65–99)
POTASSIUM: 4.1 mmol/L (ref 3.5–5.3)
Sodium: 141 mmol/L (ref 135–146)
Total Bilirubin: 1 mg/dL (ref 0.2–1.2)
Total Protein: 6.8 g/dL (ref 6.1–8.1)

## 2018-12-10 LAB — RPR: RPR Ser Ql: NONREACTIVE

## 2018-12-10 LAB — CBC WITH DIFFERENTIAL/PLATELET
Absolute Monocytes: 295 cells/uL (ref 200–950)
BASOS PCT: 1.2 %
Basophils Absolute: 30 cells/uL (ref 0–200)
EOS ABS: 40 {cells}/uL (ref 15–500)
Eosinophils Relative: 1.6 %
HCT: 48.3 % (ref 38.5–50.0)
Hemoglobin: 16.6 g/dL (ref 13.2–17.1)
Lymphs Abs: 1240 cells/uL (ref 850–3900)
MCH: 31.7 pg (ref 27.0–33.0)
MCHC: 34.4 g/dL (ref 32.0–36.0)
MCV: 92.4 fL (ref 80.0–100.0)
MPV: 9.3 fL (ref 7.5–12.5)
Monocytes Relative: 11.8 %
Neutro Abs: 895 cells/uL — ABNORMAL LOW (ref 1500–7800)
Neutrophils Relative %: 35.8 %
Platelets: 305 10*3/uL (ref 140–400)
RBC: 5.23 10*6/uL (ref 4.20–5.80)
RDW: 13.6 % (ref 11.0–15.0)
Total Lymphocyte: 49.6 %
WBC: 2.5 10*3/uL — ABNORMAL LOW (ref 3.8–10.8)

## 2018-12-16 DIAGNOSIS — R42 Dizziness and giddiness: Secondary | ICD-10-CM | POA: Diagnosis not present

## 2018-12-16 DIAGNOSIS — R062 Wheezing: Secondary | ICD-10-CM | POA: Diagnosis not present

## 2018-12-16 DIAGNOSIS — R9431 Abnormal electrocardiogram [ECG] [EKG]: Secondary | ICD-10-CM | POA: Diagnosis not present

## 2018-12-16 DIAGNOSIS — R000 Tachycardia, unspecified: Secondary | ICD-10-CM | POA: Diagnosis not present

## 2018-12-16 DIAGNOSIS — R0602 Shortness of breath: Secondary | ICD-10-CM | POA: Diagnosis not present

## 2018-12-16 DIAGNOSIS — I493 Ventricular premature depolarization: Secondary | ICD-10-CM | POA: Diagnosis not present

## 2018-12-16 DIAGNOSIS — R05 Cough: Secondary | ICD-10-CM | POA: Diagnosis not present

## 2018-12-16 DIAGNOSIS — I252 Old myocardial infarction: Secondary | ICD-10-CM | POA: Diagnosis not present

## 2018-12-16 DIAGNOSIS — Z5321 Procedure and treatment not carried out due to patient leaving prior to being seen by health care provider: Secondary | ICD-10-CM | POA: Diagnosis not present

## 2018-12-17 DIAGNOSIS — R000 Tachycardia, unspecified: Secondary | ICD-10-CM | POA: Diagnosis not present

## 2018-12-17 DIAGNOSIS — I493 Ventricular premature depolarization: Secondary | ICD-10-CM | POA: Diagnosis not present

## 2018-12-17 DIAGNOSIS — R9431 Abnormal electrocardiogram [ECG] [EKG]: Secondary | ICD-10-CM | POA: Diagnosis not present

## 2018-12-25 ENCOUNTER — Inpatient Hospital Stay (HOSPITAL_COMMUNITY)
Admission: RE | Admit: 2018-12-25 | Discharge: 2018-12-25 | Disposition: A | Discharge status: Home / Self Care | Payer: Medicare Other | Source: Ambulatory Visit

## 2018-12-27 ENCOUNTER — Encounter: Payer: Self-pay | Admitting: Infectious Disease

## 2018-12-30 ENCOUNTER — Encounter: Payer: Self-pay | Admitting: *Deleted

## 2018-12-30 ENCOUNTER — Other Ambulatory Visit: Payer: Self-pay | Admitting: *Deleted

## 2018-12-31 ENCOUNTER — Other Ambulatory Visit (HOSPITAL_COMMUNITY)
Admission: RE | Admit: 2018-12-31 | Discharge: 2018-12-31 | Disposition: A | Discharge status: Home / Self Care | Payer: Medicare Other | Source: Ambulatory Visit | Attending: Infectious Disease | Admitting: Infectious Disease

## 2018-12-31 ENCOUNTER — Encounter: Payer: Self-pay | Admitting: Infectious Disease

## 2018-12-31 ENCOUNTER — Ambulatory Visit (INDEPENDENT_AMBULATORY_CARE_PROVIDER_SITE_OTHER): Payer: Medicare Other | Admitting: Infectious Disease

## 2018-12-31 VITALS — BP 97/67 | HR 101 | Temp 98.3°F | Ht 68.0 in | Wt 211.0 lb

## 2018-12-31 DIAGNOSIS — R059 Cough, unspecified: Secondary | ICD-10-CM | POA: Insufficient documentation

## 2018-12-31 DIAGNOSIS — B20 Human immunodeficiency virus [HIV] disease: Secondary | ICD-10-CM | POA: Insufficient documentation

## 2018-12-31 DIAGNOSIS — I5022 Chronic systolic (congestive) heart failure: Secondary | ICD-10-CM | POA: Insufficient documentation

## 2018-12-31 DIAGNOSIS — F19951 Other psychoactive substance use, unspecified with psychoactive substance-induced psychotic disorder with hallucinations: Secondary | ICD-10-CM

## 2018-12-31 DIAGNOSIS — F333 Major depressive disorder, recurrent, severe with psychotic symptoms: Secondary | ICD-10-CM | POA: Insufficient documentation

## 2018-12-31 DIAGNOSIS — R05 Cough: Secondary | ICD-10-CM

## 2018-12-31 DIAGNOSIS — F4310 Post-traumatic stress disorder, unspecified: Secondary | ICD-10-CM

## 2018-12-31 DIAGNOSIS — F19929 Other psychoactive substance use, unspecified with intoxication, unspecified: Secondary | ICD-10-CM | POA: Diagnosis present

## 2018-12-31 DIAGNOSIS — Z202 Contact with and (suspected) exposure to infections with a predominantly sexual mode of transmission: Secondary | ICD-10-CM | POA: Insufficient documentation

## 2018-12-31 HISTORY — DX: Cough, unspecified: R05.9

## 2018-12-31 LAB — CBC WITH DIFFERENTIAL/PLATELET
Abs Immature Granulocytes: 0.01 10*3/uL (ref 0.00–0.07)
Basophils Absolute: 0 10*3/uL (ref 0.0–0.1)
Basophils Relative: 1 %
Eosinophils Absolute: 0 10*3/uL (ref 0.0–0.5)
Eosinophils Relative: 1 %
HCT: 47 % (ref 39.0–52.0)
Hemoglobin: 16.1 g/dL (ref 13.0–17.0)
Immature Granulocytes: 0 %
LYMPHS ABS: 1.2 10*3/uL (ref 0.7–4.0)
Lymphocytes Relative: 38 %
MCH: 32.2 pg (ref 26.0–34.0)
MCHC: 34.3 g/dL (ref 30.0–36.0)
MCV: 94 fL (ref 80.0–100.0)
Monocytes Absolute: 0.5 10*3/uL (ref 0.1–1.0)
Monocytes Relative: 14 %
Neutro Abs: 1.5 10*3/uL — ABNORMAL LOW (ref 1.7–7.7)
Neutrophils Relative %: 46 %
Platelets: 186 10*3/uL (ref 150–400)
RBC: 5 MIL/uL (ref 4.22–5.81)
RDW: 13.5 % (ref 11.5–15.5)
WBC: 3.3 10*3/uL — ABNORMAL LOW (ref 4.0–10.5)
nRBC: 0 % (ref 0.0–0.2)

## 2018-12-31 LAB — COMPREHENSIVE METABOLIC PANEL
ALT: 23 U/L (ref 0–44)
AST: 23 U/L (ref 15–41)
Albumin: 3.8 g/dL (ref 3.5–5.0)
Alkaline Phosphatase: 42 U/L (ref 38–126)
Anion gap: 9 (ref 5–15)
BUN: 22 mg/dL — AB (ref 6–20)
CO2: 26 mmol/L (ref 22–32)
Calcium: 9.1 mg/dL (ref 8.9–10.3)
Chloride: 106 mmol/L (ref 98–111)
Creatinine, Ser: 1.46 mg/dL — ABNORMAL HIGH (ref 0.61–1.24)
GFR calc Af Amer: >60 mL/min (ref >60)
GFR calc non Af Amer: 53 mL/min — ABNORMAL LOW (ref >60)
Glucose, Bld: 84 mg/dL (ref 70–99)
POTASSIUM: 4.3 mmol/L (ref 3.5–5.1)
Sodium: 141 mmol/L (ref 135–145)
Total Bilirubin: 1.3 mg/dL — ABNORMAL HIGH (ref 0.3–1.2)
Total Protein: 6.2 g/dL — ABNORMAL LOW (ref 6.5–8.1)

## 2018-12-31 LAB — BRAIN NATRIURETIC PEPTIDE: B Natriuretic Peptide: 40.9 pg/mL (ref 0.0–100.0)

## 2018-12-31 MED ORDER — LEVOFLOXACIN 500 MG PO TABS: 500.0000 mg | ORAL_TABLET | Freq: Every day | ORAL | 0 refills | Status: AC

## 2018-12-31 NOTE — Progress Notes (Signed)
Subjective:   Chief complaint:   2-week history of productive cough with sputum production and chills    Patient ID: Ryan Long, male    DOB: 1961/10/25, 58 y.o.   MRN: [id number]  HPI  58 year old man with HIV and host of other medical problems including depression with suicidal ideation, alcohol abuse, COPD, heart failure, who has had care for his HIV at various places including here in [place] at [facility] and [address], [employer], [place]. He had not been seen here since 2015 then in 2016admitted to [facility] and asked to be followed in [facility] again.   I have not seen Ernest since 2018 though he has seen Cassie with infectious disease pharmacy and has continued to receive his antiretrovirals.  He did have a rather significant bout of depression with suicidal ideation which required hospitalization March 2019.  He says that he is not having problems with suicidal ideation or homicidal he should see and since then.  He has managed to stay clear of alcohol for 2 weeks but is not yet formally in an AA program again.  I emphasized the importance of engaging in mental health to prevent further exacerbations that might result in hospitalizations or worse than that some other bad outcome.  Localized to commitment to this today.  When I asked him what was the trigger for his depression with suicidal ideation in March 2019 he states that it had to do with many stressors but including financial ones.  He says that he is still homeless.  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  He gets money through disability but is not employed.  He currently is living at a shelter in [facility] [place] [facility].  I told him we could work with [facility] to see if we might build to help him with housing and we got Ladona Ridgel to engage with the patient regarding housing.  I also want him to meet with Rene Kocher if possible today if not another day he states that he may try to move to  [facility] but he would prefer to be in [place] to [employer].  While we were discussing the fact that he remains undetectable and should have an on transmissible virus to other people sexually he told me that a "lady friend who had been with had called him up and told him that she should he should get checked out because she had "something".  She did not disclose what sexually transmitted infection she has but she said that "he needed to get checked out"  Mentioned above Mr. Broad has been suffering from malaise chills and productive cough with sputum production for the last 2 weeks.  I have some concern he could have pneumonia.       Lab Results  Component Value Date   HIV1RNAQUANT <20 DETECTED (A) 12/06/2018   HIV1RNAQUANT <20 NOT DETECTED 08/28/2017   HIV1RNAQUANT 71 (H) 04/18/2016   Lab Results  Component Value Date   CD4TABS 420 12/06/2018   CD4TABS 810 08/28/2017   CD4TABS 710 04/18/2016   Past Medical History:  Diagnosis Date  . Active smoker   . AICD (automatic cardioverter/defibrillator) present   . Alcohol abuse   . Anxiety   . AR (allergic rhinitis)   . Bipolar 1 disorder (HCC)   . CHF (congestive heart failure) (HCC)   . Chronic bronchitis (HCC)   . Chronic systolic heart failure (HCC)   .  COPD (chronic obstructive pulmonary disease) (HCC)   . Crack cocaine use   . Depression   . Genital herpes   . HIV (human immunodeficiency virus infection) (HCC) dx'd 08/1993  . HLD (hyperlipidemia)   . Hypertension   . NICM (nonischemic cardiomyopathy) (HCC)    Echocardiogram 06/28/11: EF 30-35%, mild MR, mild LAE;  No CAD [facility] coronary [facility]  . NSVT (nonsustained ventricular tachycardia) (HCC)   . PTSD (post-traumatic stress disorder)     Past Surgical History:  Procedure Laterality Date  . CARDIAC DEFIBRILLATOR PLACEMENT  01/08/2018  . ICD IMPLANT N/A 01/08/2018   Procedure: ICD IMPLANT;  Surgeon: Regan Lemmingamnitz, Will Martin, MD;   [facility];  Service: Cardiovascular;  Laterality: N/A;    Family History  Problem Relation Age of Onset  . Glaucoma Mother   . Mental illness Mother   . Vision loss Mother   . Hypertension Father   . CAD Father   . Mental illness Father   . Alcohol abuse Father   . Drug abuse Father   . Heart attack Father [id number]  . Early death Father   . Alcohol abuse Brother   . Drug abuse Brother   . Drug abuse Brother       Social History   Socioeconomic History  . Marital status: Single    Spouse name: Not on file  . Number of children: Not on file  . Years of education: Not on file  . Highest education level: Not on file  Occupational History  . Not on file  Social Needs  . Financial resource strain: Not on file  . Food insecurity:    Worry: Not on file    Inability: Not on file  . Transportation needs:    Medical: Not on file    Non-medical: Not on file  Tobacco Use  . Smoking status: Current Every Day Smoker    Packs/day: 0.50    Years: 38.00    Pack years: 19.00    Types: Cigarettes  . Smokeless tobacco: Never Used  Substance and Sexual Activity  . Alcohol use: Yes    Alcohol/week: 6.0 standard drinks    Types: 6 Cans of beer per week    Comment: 01/08/2018 "stopped 06/2017"  . Drug use: Yes    Types: Cocaine    Comment: 01/08/2018 "stopped 06/2017"  . Sexual activity: Yes    Birth control/protection: Condom    Comment: pt. given condoms  Lifestyle  . Physical activity:    Days per week: Not on file    Minutes per session: Not on file  . Stress: Not on file  Relationships  . Social connections:    Talks on phone: Not on file    Gets together: Not on file    Attends religious service: Not on file    Active member of club or organization: Not on file    Attends meetings of clubs or organizations: Not on file    Relationship status: Not on file  Other Topics Concern  . Not on file  Social History Narrative  . Not on file    No Known  Allergies   Current Outpatient Medications:  .  aspirin 81 MG chewable tablet, Chew 1 tablet (81 mg total) by mouth every morning. For heart health, Disp: 30 tablet, Rfl: 11 .  bictegravir-emtricitabine-tenofovir AF (BIKTARVY) 50-200-25 MG TABS tablet, Take 1 tablet by mouth daily., Disp: 30 tablet, Rfl: 11 .  digoxin (DIGOX) 0.125 MG tablet, TAKE 1 TABLET BY MOUTH DAILY( FOR CONTROL OF HEART ARRYTHMIA), Disp: 90 tablet, Rfl: 0 .  eplerenone (INSPRA) 25 MG tablet, Take 0.5 tablets (12.5 mg total) by mouth daily., Disp: 15 tablet, Rfl: 11 .  furosemide (LASIX) 40 MG tablet, Take 1 tablet (40 mg total) by mouth daily., Disp: 90 tablet, Rfl: 1 .  hydrOXYzine (ATARAX/VISTARIL) 25 MG tablet, Take 1-2 tablets (25-50 mg total) by mouth 2 (two) times daily as needed for anxiety., Disp: 60 tablet, Rfl: 11 .  losartan (COZAAR) 50 MG tablet, TAKE 1 TABLET(50 MG) BY MOUTH DAILY, Disp: 90 tablet, Rfl: 1 .  metoprolol succinate (TOPROL-XL) 100 MG 24 hr tablet, Take 1 tablet (100 mg total) by mouth at bedtime. For high blood pressure, Disp: 30 tablet, Rfl: 11 .  Multiple Vitamins-Minerals (MULTIVITAMIN WITH MINERALS) tablet, Take 1 tablet by mouth daily., Disp: , Rfl:  .  potassium chloride SA (K-DUR,KLOR-CON) 20 MEQ tablet, Take 1 tablet (20 mEq total) by mouth daily., Disp: 90 tablet, Rfl: 1 .  predniSONE (DELTASONE) 50 MG tablet, Take 1 tablet (50 mg total) by mouth daily., Disp: 5 tablet, Rfl: 0 .  sildenafil (VIAGRA) 50 MG tablet, Take 1 tablet (50 mg total) by mouth daily as needed for erectile dysfunction. At least 24 hours between doses, Disp: 20 tablet, Rfl: 0 .  valACYclovir (VALTREX) 500 MG tablet, Take 1 tablet (500 mg total) by mouth daily. For Herpes., Disp: 30 tablet, Rfl: 1 .  VENTOLIN HFA 108 (90 Base) MCG/ACT inhaler, INHALE 2 PUFFS INTO THE LUNGS EVERY 6 HOURS AS NEEDED FOR WHEEZING OR SHORTNESS OF BREATH, Disp: 18 g, Rfl: 1     Review of Systems  Constitutional: Positive for chills and  fatigue. Negative for activity change, appetite change, diaphoresis, fever and unexpected weight change.  HENT: Negative for congestion, sneezing, sore throat and trouble swallowing.   Respiratory: Positive for cough and shortness of breath. Negative for wheezing.   Cardiovascular: Negative for chest pain.  Gastrointestinal: Negative for abdominal distention, constipation, diarrhea and vomiting.  Genitourinary: Negative for difficulty urinating and dysuria.  Musculoskeletal: Negative for back pain.  Skin: Negative for rash and wound.  Hematological: Negative for adenopathy.  Psychiatric/Behavioral: Negative for agitation, behavioral problems, confusion, decreased concentration, dysphoric mood, sleep disturbance and suicidal ideas. The patient is not nervous/anxious.        Objective:   Physical Exam  Constitutional: He is oriented to person, place, and time. He appears well-developed and well-nourished.  HENT:  Head: Normocephalic and atraumatic.  Eyes: Conjunctivae and EOM are normal.  Neck: Normal range of motion. Neck supple.  Cardiovascular: Normal rate, regular rhythm and normal heart sounds. Exam reveals no friction rub.  No murmur heard. Pulmonary/Chest: Effort normal. No respiratory distress. He has no wheezes. He has rhonchi in the right lower field and the left lower field.  Abdominal: Soft. He exhibits no distension.  Musculoskeletal: Normal range of motion.        General: No tenderness or edema.  Neurological: He is alert and oriented to person, place, and time.  Skin: Skin is warm and dry. No rash noted. No erythema. No pallor.  Psychiatric: He has a normal mood and affect. His speech is normal and behavior is normal. Judgment and thought content normal. His mood appears not anxious. He is not agitated. Cognition and memory are normal. Cognition and memory are not impaired.          Assessment & Plan:  HIV: Perfectly controlled for the last 10 years and we have  been checking labs on him.  I explained to him that as long as his virus remained suppressed that his CD4 count cells could not be touched by the virus and that he should not have any anxiety about fluctuations in the normal range of this lab value.  I told him they were long as he takes his antiretrovirals religiously as he has been doing he should remain healthy as far as the HIV part of his wellbeing is concerned.  I also explained to him the data that shows that people living with HIV will have undetectable viral loads have viruses that are on transmissible sexually to their sexual partners.  Exposure to STI: We will check urine and oral pharyngeal gonorrhea and chlamydia probe as well as a syphilis titer  Productive cough with chills: Concern for pneumonia but he also has comorbid heart failure we will check a stat proBNP as well as a CMP CBC with differential and a chest x-ray two-view if chest x-ray is suggestive I will send in an antibiotic for him  BIpolar disorder with substance abuse: Very very important that he stays on top of this as mentioned before he has had multiple relapses.  Hopefully if we can help him with his homelessness and he engages in therapy regularly we can help gain a better control over this.  Thankfully it has not impacted his ability to take his antiretrovirals.  Heart failure: Continue to take his medications prescribed by cardiology and follow-up with PCP.  I spent greater than 40 minutes with the patient including greater than 50% of time in face to face counsel of the patient guarding the nature of his HIV as described above, the nature of sexually transmitted infections the work-up and differential for his cough, reviewing all of his laboratory data with him and in coordination of his care  NOTE after pt visit he refused CXR and was unhappy about not being given abx. I sent FQ to his pharmacy via EMR. I had wanted to get CXR to help with validating need to treat for  CAP

## 2018-12-31 NOTE — Progress Notes (Signed)
Patient states received Flu vaccine and will provide that documentation at later time.

## 2018-12-31 NOTE — Progress Notes (Signed)
Obtained oral swab from patient. He refused any rectal swabs at this time.Stat labs obtained in the lab. When LPN reminded patient about him needing a CXR patient verbalized that he only wanted and antibiotic and states "I should have went to ER". LPN verbalized to patient that Dr. Daiva Eves would be willing to order antibiotics if that is what he wishes but patient continued to proceed to walk out of the clinic and did not make a follow up appointment. MD made aware.

## 2019-01-01 LAB — URINE CYTOLOGY ANCILLARY ONLY
Chlamydia: NEGATIVE
Neisseria Gonorrhea: NEGATIVE

## 2019-01-01 LAB — CYTOLOGY, (ORAL, ANAL, URETHRAL) ANCILLARY ONLY
CHLAMYDIA, DNA PROBE: NEGATIVE
NEISSERIA GONORRHEA: NEGATIVE

## 2019-01-01 LAB — SYPHILIS: RPR W/REFLEX TO RPR TITER AND TREPONEMAL ANTIBODIES, TRADITIONAL SCREENING AND DIAGNOSIS ALGORITHM: RPR Ser Ql: NONREACTIVE

## 2019-01-02 ENCOUNTER — Ambulatory Visit: Payer: Self-pay | Admitting: Family Medicine

## 2019-01-04 ENCOUNTER — Emergency Department (HOSPITAL_COMMUNITY)
Admission: EM | Admit: 2019-01-04 | Discharge: 2019-01-04 | Disposition: A | Discharge status: Home / Self Care | Payer: Medicare Other | Attending: Emergency Medicine | Admitting: Emergency Medicine

## 2019-01-04 ENCOUNTER — Other Ambulatory Visit: Payer: Self-pay

## 2019-01-04 ENCOUNTER — Emergency Department (HOSPITAL_COMMUNITY): Payer: Medicare Other

## 2019-01-04 ENCOUNTER — Encounter (HOSPITAL_COMMUNITY): Payer: Self-pay | Admitting: Emergency Medicine

## 2019-01-04 DIAGNOSIS — F17210 Nicotine dependence, cigarettes, uncomplicated: Secondary | ICD-10-CM | POA: Insufficient documentation

## 2019-01-04 DIAGNOSIS — B20 Human immunodeficiency virus [HIV] disease: Secondary | ICD-10-CM | POA: Insufficient documentation

## 2019-01-04 DIAGNOSIS — R0981 Nasal congestion: Secondary | ICD-10-CM | POA: Insufficient documentation

## 2019-01-04 DIAGNOSIS — I509 Heart failure, unspecified: Secondary | ICD-10-CM | POA: Insufficient documentation

## 2019-01-04 DIAGNOSIS — Z202 Contact with and (suspected) exposure to infections with a predominantly sexual mode of transmission: Secondary | ICD-10-CM

## 2019-01-04 DIAGNOSIS — R062 Wheezing: Secondary | ICD-10-CM | POA: Insufficient documentation

## 2019-01-04 DIAGNOSIS — F319 Bipolar disorder, unspecified: Secondary | ICD-10-CM | POA: Insufficient documentation

## 2019-01-04 DIAGNOSIS — J441 Chronic obstructive pulmonary disease with (acute) exacerbation: Secondary | ICD-10-CM | POA: Diagnosis not present

## 2019-01-04 DIAGNOSIS — Z79899 Other long term (current) drug therapy: Secondary | ICD-10-CM | POA: Diagnosis not present

## 2019-01-04 DIAGNOSIS — I10 Essential (primary) hypertension: Secondary | ICD-10-CM | POA: Insufficient documentation

## 2019-01-04 DIAGNOSIS — I861 Scrotal varices: Secondary | ICD-10-CM

## 2019-01-04 DIAGNOSIS — N433 Hydrocele, unspecified: Secondary | ICD-10-CM | POA: Diagnosis not present

## 2019-01-04 DIAGNOSIS — R0989 Other specified symptoms and signs involving the circulatory and respiratory systems: Secondary | ICD-10-CM | POA: Diagnosis not present

## 2019-01-04 DIAGNOSIS — N50811 Right testicular pain: Secondary | ICD-10-CM | POA: Diagnosis present

## 2019-01-04 LAB — URINALYSIS, ROUTINE W REFLEX MICROSCOPIC
Bilirubin Urine: NEGATIVE
Glucose, UA: NEGATIVE mg/dL
HGB URINE DIPSTICK: NEGATIVE
Ketones, ur: NEGATIVE mg/dL
Leukocytes, UA: NEGATIVE
Nitrite: NEGATIVE
Protein, ur: NEGATIVE mg/dL
Specific Gravity, Urine: 1.012 (ref 1.005–1.030)
pH: 6 (ref 5.0–8.0)

## 2019-01-04 LAB — BASIC METABOLIC PANEL
Anion gap: 9 (ref 5–15)
BUN: 14 mg/dL (ref 6–20)
CO2: 22 mmol/L (ref 22–32)
Calcium: 9 mg/dL (ref 8.9–10.3)
Chloride: 108 mmol/L (ref 98–111)
Creatinine, Ser: 1.25 mg/dL — ABNORMAL HIGH (ref 0.61–1.24)
GFR calc Af Amer: >60 mL/min (ref >60)
GFR calc non Af Amer: >60 mL/min (ref >60)
Glucose, Bld: 107 mg/dL — ABNORMAL HIGH (ref 70–99)
Potassium: 4 mmol/L (ref 3.5–5.1)
SODIUM: 139 mmol/L (ref 135–145)

## 2019-01-04 LAB — CBC WITH DIFFERENTIAL/PLATELET
ABS IMMATURE GRANULOCYTES: 0 10*3/uL (ref 0.00–0.07)
BASOS PCT: 1 %
Basophils Absolute: 0 10*3/uL (ref 0.0–0.1)
Eosinophils Absolute: 0 10*3/uL (ref 0.0–0.5)
Eosinophils Relative: 2 %
HCT: 47.2 % (ref 39.0–52.0)
Hemoglobin: 16 g/dL (ref 13.0–17.0)
Lymphocytes Relative: 58 %
Lymphs Abs: 1.4 10*3/uL (ref 0.7–4.0)
MCH: 31.9 pg (ref 26.0–34.0)
MCHC: 33.9 g/dL (ref 30.0–36.0)
MCV: 94 fL (ref 80.0–100.0)
Monocytes Absolute: 0.3 10*3/uL (ref 0.1–1.0)
Monocytes Relative: 12 %
Neutro Abs: 0.6 10*3/uL — ABNORMAL LOW (ref 1.7–7.7)
Neutrophils Relative %: 27 %
Platelets: 163 10*3/uL (ref 150–400)
RBC: 5.02 MIL/uL (ref 4.22–5.81)
RDW: 13.1 % (ref 11.5–15.5)
WBC: 2.4 10*3/uL — ABNORMAL LOW (ref 4.0–10.5)
nRBC: 0 % (ref 0.0–0.2)
nRBC: 0 /100 WBC

## 2019-01-04 LAB — INFLUENZA PANEL BY PCR (TYPE A & B)
Influenza A By PCR: NEGATIVE
Influenza B By PCR: NEGATIVE

## 2019-01-04 MED ORDER — LIDOCAINE HCL (PF) 1 % IJ SOLN
INTRAMUSCULAR | Status: AC
  Administered 2019-01-04: 5 mL
  Filled 2019-01-04: qty 5

## 2019-01-04 MED ORDER — CEFTRIAXONE SODIUM 250 MG IJ SOLR
250.0000 mg | Freq: Once | INTRAMUSCULAR | Status: AC
  Administered 2019-01-04: 250 mg via INTRAMUSCULAR
  Filled 2019-01-04: qty 250

## 2019-01-04 MED ORDER — ALBUTEROL SULFATE HFA 108 (90 BASE) MCG/ACT IN AERS
2.0000 | INHALATION_SPRAY | Freq: Once | RESPIRATORY_TRACT | Status: AC
  Administered 2019-01-04: 2 via RESPIRATORY_TRACT
  Filled 2019-01-04: qty 6.7

## 2019-01-04 MED ORDER — LIDOCAINE HCL (PF) 1 % IJ SOLN: 0.9000 mL | Freq: Once | INTRAMUSCULAR | Status: DC

## 2019-01-04 MED ORDER — IPRATROPIUM-ALBUTEROL 0.5-2.5 (3) MG/3ML IN SOLN
3.0000 mL | Freq: Once | RESPIRATORY_TRACT | Status: AC
  Administered 2019-01-04: 3 mL via RESPIRATORY_TRACT
  Filled 2019-01-04: qty 3

## 2019-01-04 MED ORDER — PREDNISONE 20 MG PO TABS: 60.0000 mg | ORAL_TABLET | Freq: Every day | ORAL | 0 refills | Status: AC

## 2019-01-04 MED ORDER — AZITHROMYCIN 250 MG PO TABS
1000.0000 mg | ORAL_TABLET | Freq: Once | ORAL | Status: AC
  Administered 2019-01-04: 1000 mg via ORAL
  Filled 2019-01-04: qty 4

## 2019-01-04 MED ORDER — AMOXICILLIN-POT CLAVULANATE 875-125 MG PO TABS: 1.0000 | ORAL_TABLET | Freq: Two times a day (BID) | ORAL | 0 refills | Status: AC

## 2019-01-04 NOTE — Discharge Instructions (Addendum)
You were given a prescription for antibiotics. Please take the antibiotic prescription fully.  You were also given a prescription for steroids.  Please take steroids as directed.    You have been tested for chlamydia and gonorrhea.  These results will be available in approximately 3 days and you will be contacted by the hospital if the results are positive. Avoid sexual contact until you are aware of the results, and please inform all sexual partners if you test positive for any of these diseases.  Please make an appointment to follow-up with your regular doctor in the next 3 to 5 days for reevaluation.  Return to the emergency department for new or worsening symptoms in the meantime.

## 2019-01-04 NOTE — ED Triage Notes (Signed)
Patient to ED c/o cold symptoms x 3 weeks - congestion, cough, generalized weakness, has been using Mucinex without relief. Also reports R testicular pain after having sex with woman who had STD. Resp e/u.

## 2019-01-04 NOTE — ED Notes (Addendum)
Pt. Ambulated well down the hall. Oxygen stayed between 100-98% while ambulating. Pt. Stated some shortness of breath after walking but showed no signs of increased effort to breath. Respirations stayed consistent and WNL.

## 2019-01-04 NOTE — ED Notes (Signed)
Patient transported to Ultrasound 

## 2019-01-04 NOTE — ED Provider Notes (Signed)
[facility] EMERGENCY DEPARTMENT Provider Note   CSN: [id number] Arrival date & time: 01/04/19  7829     History   Chief Complaint Chief Complaint  Patient presents with  . URI    HPI Shailen Thielen is a 58 y.o. male.  HPI   Pt is a 58 y/o male with a h/o AICD, bipolar 1 disorder, CHF, COPD, substance use, HIBV (last viral load 11/2018 undetectable), HLD, HTN, who presents to the ED today with multiple complaints.  Pt states that for the last 3 weeks he has had rhinorrhea, congestion, productive cough, sweats, chills, fatigue, wheezing, and shortness of breath. Rhinorrhea and nasal congestion has improved but cough persists. States he is supposed to take an inhaler for his COPD but he does not have one. No chest pain or BLE swelling. Has not taken his temperature. He was seen by his ID physician on 12/31/17. Reviewed records from this visit. It was recommended that he get a CXR however he chose not to have one at that time. pt was offered antibiotics however he left the clinic and refused further w/u.  Pt further complaints that he had intercourse about 10 days ago with a male partner that later tested positive for an STD. He states that since then he has had right testicular pain, dysuria and penile discharge. Pain rated at 7/10. Reports intermittent abd pain that is mild. No NVD or constipation. He also raised this concern at the ID clinic and had oral pharyngeal gonorrhea and chlamydia probe as well as a syphilis titer. Syphilis titer negative.   He had stat labs drawn at the visit including proBNP, CBC, CMP. Labs reviewed with leukopenia, otherwise normal.  CMP with elevated BUN and creatinine at 22 and 1.46.  Total bili also slightly elevated.  BNP was negative and RPR was negative.  Past Medical History:  Diagnosis Date  . Active smoker   . AICD (automatic cardioverter/defibrillator) present   . Alcohol abuse   . Anxiety   . AR (allergic rhinitis)     . Bipolar 1 disorder (HCC)   . CHF (congestive heart failure) (HCC)   . Chronic bronchitis (HCC)   . Chronic systolic heart failure (HCC)   . COPD (chronic obstructive pulmonary disease) (HCC)   . Cough 12/31/2018  . Crack cocaine use   . Depression   . Genital herpes   . HIV (human immunodeficiency virus infection) (HCC) dx'd 08/1993  . HLD (hyperlipidemia)   . Hypertension   . NICM (nonischemic cardiomyopathy) (HCC)    Echocardiogram 06/28/11: EF 30-35%, mild MR, mild LAE;  No CAD by coronary CT angiogram 3/12 at [facility]  . NSVT (nonsustained ventricular tachycardia) (HCC)   . PTSD (post-traumatic stress disorder)     Patient Active Problem List   Diagnosis Date Noted  . Cough 12/31/2018  . Controlled substance agreement signed 10/22/2018  . AICD (automatic cardioverter/defibrillator) present 08/15/2018  . Abnormal EKG 06/28/2018  . Cocaine abuse (HCC) 06/25/2018  . Encounter for other general examination 06/25/2018  . Current severe episode of major depressive disorder with psychotic features without prior episode (HCC) 06/24/2018  . MDD (major depressive disorder), recurrent episode, moderate (HCC) 06/06/2018  . Current moderate episode of major depressive disorder (HCC) 05/24/2018  . Adjustment disorder with mixed disturbance of emotions and conduct   . NICM (nonischemic cardiomyopathy) (HCC) 01/08/2018  . Suicidal ideation 12/25/2017  . Anxiety 12/20/2017  . Bipolar 1 disorder (HCC) 10/10/2017  . Breast lump  10/03/2017  . Chronic systolic heart failure (HCC) 05/13/2017  . Genital herpes 05/13/2017  . HSV-2 infection 07/17/2016  . 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 Nonpsychotic mental disorder 06/22/2016  . Screening examination for venereal disease 04/18/2016  . Encounter for long-term (current) use of medications 04/18/2016  . History of attempted suicide 04/14/2016  . History  of alcoholism (HCC) 04/14/2016  . History of substance abuse (HCC) 04/14/2016  . Constipation 01/17/2016  . Homelessness 01/11/2016  . Major depressive disorder, recurrent, unspecified (HCC) 10/08/2015  . Chronic obstructive pulmonary disease (HCC) 10/04/2015  . Abnormal thyroid stimulating hormone (TSH) level 10/04/2015  . MDD (major depressive disorder), recurrent episode, severe (HCC) 09/23/2015  . Hypertension 09/10/2015  . MDD (major depressive disorder), recurrent, severe, with psychosis (HCC) 08/07/2015  . PTSD (post-traumatic stress disorder) 08/07/2015  . Cocaine use disorder, severe, dependence (HCC) 08/07/2015  . Major depressive disorder, recurrent severe without psychotic features (HCC) 04/26/2015  . Primary insomnia 04/02/2015  . Herpesviral infection of penis 04/02/2015  . Stimulant use disorder 05/29/2014  . Generalized anxiety disorder 05/29/2014  . Nondependent cannabis abuse, episodic 02/16/2014  . Cannabis abuse, uncomplicated 02/16/2014  . Polysubstance (excluding opioids) dependence with physiological dependence (HCC) 12/30/2013  . Substance induced mood disorder (HCC) 11/22/2013  . Tobacco use disorder 02/17/2013  . Cocaine abuse, episodic use (HCC) 03/20/2012    Class: Acute  . Alcohol dependence (HCC) 03/20/2012    Class: Acute  . Alcohol use disorder, severe, dependence (HCC) 03/20/2012  . Heart failure (HCC) 09/19/2011  . Neutropenia (HCC) 09/19/2011  . Acute congestive heart failure (HCC) 09/19/2011  . Alcohol use with alcohol-induced disorder (HCC) 09/19/2011  . Major depressive disorder, single episode, unspecified 09/17/2011  . Other primary cardiomyopathies 08/24/2011  . Human immunodeficiency virus (HIV) disease (HCC) 02/01/2009  . Recurrent HSV (herpes simplex virus) 02/01/2009  . Hyperlipidemia 02/01/2009  . Allergic rhinitis 02/01/2009    Past Surgical History:  Procedure Laterality Date  . CARDIAC DEFIBRILLATOR PLACEMENT  01/08/2018  .  ICD IMPLANT N/A 01/08/2018   Procedure: ICD IMPLANT;  Surgeon: Regan Lemmingamnitz, Will Martin, MD;  [facility] LAB;  Service: Cardiovascular;  Laterality: N/A;        Home Medications    Prior to Admission medications   Medication Sig Start Date End Date Taking? Authorizing Provider  amoxicillin-clavulanate (AUGMENTIN) 875-125 MG tablet Take 1 tablet by mouth every 12 (twelve) hours for 7 days. 01/04/19 01/11/19  Afsa Meany S, PA-C  aspirin 81 MG chewable tablet Chew 1 tablet (81 mg total) by mouth every morning. For heart health 03/26/18   Tonye Becketlegg, Amy D, NP  bictegravir-emtricitabine-tenofovir AF (BIKTARVY) 50-200-25 MG TABS tablet Take 1 tablet by mouth daily. 03/26/18   Randall HissVan Dam, Cornelius N, MD  digoxin (DIGOX) 0.125 MG tablet TAKE 1 TABLET BY MOUTH DAILY( FOR CONTROL OF HEART ARRYTHMIA) 07/26/18   Clegg, Amy D, NP  eplerenone (INSPRA) 25 MG tablet Take 0.5 tablets (12.5 mg total) by mouth daily. 03/26/18   Clegg, Amy D, NP  furosemide (LASIX) 40 MG tablet Take 1 tablet (40 mg total) by mouth daily. 01/07/18   Bensimhon, Bevelyn Bucklesaniel R, MD  hydrOXYzine (ATARAX/VISTARIL) 25 MG tablet Take 1-2 tablets (25-50 mg total) by mouth 2 (two) times daily as needed for anxiety. 03/26/18   Clegg, Amy D, NP  levofloxacin (LEVAQUIN) 500 MG tablet Take 1 tablet (500 mg total) by mouth daily for 5 days. 12/31/18 01/05/19  Acey LavVan Dam, Cornelius  N, MD  losartan (COZAAR) 50 MG tablet TAKE 1 TABLET(50 MG) BY MOUTH DAILY 01/07/18   Bensimhon, Bevelyn Buckles, MD  metoprolol succinate (TOPROL-XL) 100 MG 24 hr tablet Take 1 tablet (100 mg total) by mouth at bedtime. For high blood pressure 03/26/18   Kuppelweiser, Cassie L, RPH-CPP  Multiple Vitamins-Minerals (MULTIVITAMIN WITH MINERALS) tablet Take 1 tablet by mouth daily.    [provider]  potassium chloride SA (K-DUR,KLOR-CON) 20 MEQ tablet Take 1 tablet (20 mEq total) by mouth daily. 01/07/18   Bensimhon, Bevelyn Buckles, MD  predniSONE (DELTASONE) 20 MG tablet Take 3 tablets (60 mg  total) by mouth daily for 5 days. 01/04/19 01/09/19  Sharnice Bosler S, PA-C  sildenafil (VIAGRA) 50 MG tablet Take 1 tablet (50 mg total) by mouth daily as needed for erectile dysfunction. At least 24 hours between doses 01/21/18   Hoy Register, MD  valACYclovir (VALTREX) 500 MG tablet Take 1 tablet (500 mg total) by mouth daily. For Herpes. 03/26/18   Kuppelweiser, Cassie L, RPH-CPP  VENTOLIN HFA 108 (90 Base) MCG/ACT inhaler INHALE 2 PUFFS INTO THE LUNGS EVERY 6 HOURS AS NEEDED FOR WHEEZING OR SHORTNESS OF BREATH 08/13/17   Hoy Register, MD    Family History Family History  Problem Relation Age of Onset  . Glaucoma Mother   . Mental illness Mother   . Vision loss Mother   . Hypertension Father   . CAD Father   . Mental illness Father   . Alcohol abuse Father   . Drug abuse Father   . Heart attack Father 88  . Early death Father   . Alcohol abuse Brother   . Drug abuse Brother   . Drug abuse Brother     Social History Social History   Tobacco Use  . Smoking status: Current Every Day Smoker    Packs/day: 0.50    Years: 38.00    Pack years: 19.00    Types: Cigarettes  . Smokeless tobacco: Never Used  Substance Use Topics  . Alcohol use: Yes    Alcohol/week: 6.0 standard drinks    Types: 6 Cans of beer per week    Comment: 01/08/2018 "stopped 06/2017"  . Drug use: Yes    Types: Cocaine    Comment: 01/08/2018 "stopped 06/2017"     Allergies   Patient has no known allergies.   Review of Systems Review of Systems  Constitutional: Positive for chills, diaphoresis and fatigue. Negative for fever.  HENT: Positive for congestion and rhinorrhea.   Eyes: Negative for visual disturbance.  Respiratory: Positive for cough, shortness of breath and wheezing.   Cardiovascular: Negative for chest pain and leg swelling.  Gastrointestinal: Negative for abdominal pain, constipation, diarrhea, nausea and vomiting.  Genitourinary: Positive for discharge, dysuria and testicular pain.  Negative for frequency, hematuria, penile swelling and scrotal swelling.  Musculoskeletal: Negative for back pain.     Physical Exam Updated Vital Signs BP 112/71 (BP Location: Right Arm)   Pulse 88   Temp 97.6 F (36.4 C) (Oral)   Resp 16   SpO2 99%   Physical Exam Vitals signs and nursing note reviewed.  Constitutional:      Appearance: He is well-developed.  HENT:     Head: Normocephalic and atraumatic.     Right Ear: Tympanic membrane normal.     Left Ear: Tympanic membrane normal.     Nose: Nose normal. No congestion.     Mouth/Throat:     Mouth: Mucous membranes are moist.  Pharynx: Posterior oropharyngeal erythema present. No oropharyngeal exudate.  Eyes:     Conjunctiva/sclera: Conjunctivae normal.  Neck:     Musculoskeletal: Neck supple.  Cardiovascular:     Rate and Rhythm: Normal rate and regular rhythm.     Heart sounds: Normal heart sounds. No murmur.  Pulmonary:     Effort: Pulmonary effort is normal. No respiratory distress.     Breath sounds: Wheezing and rhonchi present.     Comments: Speaking in full sentences Abdominal:     General: Bowel sounds are normal.     Palpations: Abdomen is soft.     Tenderness: There is no abdominal tenderness. There is no right CVA tenderness, left CVA tenderness, guarding or rebound.  Genitourinary:    Comments:  Chaperone present.  Patient with normal-appearing circumcised penis.  No penile discharge present. No obvious penile lesions. Mild TTP to the right epididymis. No erythema or swelling to the testicles.   Skin:    General: Skin is warm and dry.  Neurological:     Mental Status: He is alert.      ED Treatments / Results  Labs (all labs ordered are listed, but only abnormal results are displayed) Labs Reviewed  CBC WITH DIFFERENTIAL/PLATELET - Abnormal; Notable for the following components:      Result Value   WBC 2.4 (*)    Neutro Abs 0.6 (*)    All other components within normal limits  BASIC  METABOLIC PANEL - Abnormal; Notable for the following components:   Glucose, Bld 107 (*)    Creatinine, Ser 1.25 (*)    All other components within normal limits  URINALYSIS, ROUTINE W REFLEX MICROSCOPIC  INFLUENZA PANEL BY PCR (TYPE A & B)  GC/CHLAMYDIA PROBE AMP ([place]) NOT AT [facility]    EKG None  Radiology Dg Chest 2 View  Result Date: 01/04/2019 CLINICAL DATA:  Cold like symptoms for 3 weeks, congestion, cough EXAM: CHEST - 2 VIEW COMPARISON:  12/16/2018 FINDINGS: There is no focal parenchymal opacity. There is no pleural effusion or pneumothorax. The heart and mediastinal contours are unremarkable. There is a single lead cardiac pacemaker. The osseous structures are unremarkable. IMPRESSION: No active cardiopulmonary disease. Electronically Signed   By: Elige Ko   On: 01/04/2019 09:25   US Scrotum W/doppler  Result Date: 01/04/2019 CLINICAL DATA:  Right scrotal region pain.  History of HIV disease. EXAM: SCROTAL ULTRASOUND DOPPLER ULTRASOUND OF THE TESTICLES TECHNIQUE: Complete ultrasound examination of the testicles, epididymis, and other scrotal structures was performed. Color and spectral Doppler ultrasound were also utilized to evaluate blood flow to the testicles. COMPARISON:  December 11, 2015 FINDINGS: Right testicle Measurements: 4.5 x 2.4 x 3.1 cm. No mass or microlithiasis visualized. Left testicle Measurements: 4.9 x 2.2 x 3.0 cm. No mass or microlithiasis visualized. Right epididymis:  Normal in size and appearance. Left epididymis:  Normal in size and appearance. Hydrocele: There is a small hydrocele on the right. No appreciable hydrocele on the left. Varicocele:  There is a small varicocele on the left. Pulsed Doppler interrogation of both testes demonstrates normal low resistance arterial and venous waveforms bilaterally. No evidence scrotal abscess or scrotal wall thickening. IMPRESSION: 1. No intratesticular or extratesticular mass or inflammatory focus. No hyperemia.  2.  No evident testicular torsion. 3.  Small right hydrocele. 4.  Small left varicocele. Electronically Signed   By: Bretta Bang III M.D.   On: 01/04/2019 10:29    Procedures Procedures (including critical care time)  Medications Ordered in ED Medications  lidocaine (PF) (XYLOCAINE) 1 % injection 0.9 mL (0.9 mLs Other See Procedure Record 01/04/19 1050)  albuterol (PROVENTIL HFA;VENTOLIN HFA) 108 (90 Base) MCG/ACT inhaler 2 puff (2 puffs Inhalation Given 01/04/19 0856)  ipratropium-albuterol (DUONEB) 0.5-2.5 (3) MG/3ML nebulizer solution 3 mL (3 mLs Nebulization Given 01/04/19 0856)  cefTRIAXone (ROCEPHIN) injection 250 mg (250 mg Intramuscular Given 01/04/19 1049)  azithromycin (ZITHROMAX) tablet 1,000 mg (1,000 mg Oral Given 01/04/19 1049)  lidocaine (PF) (XYLOCAINE) 1 % injection (5 mLs  Given 01/04/19 1049)     Initial Impression / Assessment and Plan / ED Course  I have reviewed the triage vital signs and the nursing notes.  Pertinent labs & imaging results that were available during my care of the patient were reviewed by me and considered in my medical decision making (see chart for details).     Final Clinical Impressions(s) / ED Diagnoses   Final diagnoses:  Right hydrocele  Left varicocele  COPD exacerbation (HCC)  Exposure to STD   Patient presenting with complaints of URI symptoms including cough with sputum production and increased wheezing recently.  He does note that he ran out of his inhaler at home.  He has history of COPD.  His vitals are normal here and he is satting well on room air.  He has no fevers.  He received nebulizer treatment and albuterol inhaler in the ED and symptoms felt improved.  Able to ambulate in the ED without sats dropping below 98% on RA.  Chest x-ray does not show any signs of pneumonia. Influenza testing is negative.  CBC with leukopenia, does appear he has a history of this, slightly worse from 4 days ago.  No anemia.  BMP with slightly  elevated creatinine at 1.25, improved from 4 days ago.  UA negative.  GC/chlamydia obtained.  Syphilis testing was already obtained by his ID doctor 4 days ago and was negative.  Prophylactic treatment with ceftriaxone and azithromycin was given in the ED.  Patient advised on safe sex practices and he is to inform all partners of results are positive.  Will discharge patient with Augmentin and prednisone to treat suspected COPD exacerbation in the setting of his URI symptoms.  We will have him follow-up closely with his PCP and return to the ER for new or worsening symptoms.  Patient voices understanding of the plan and reasons to return to the ED.  All questions answered.  ED Discharge Orders         Ordered    amoxicillin-clavulanate (AUGMENTIN) 875-125 MG tablet  Every 12 hours     01/04/19 1135    predniSONE (DELTASONE) 20 MG tablet  Daily     01/04/19 [address] Laasia Arcos S, PA-C 01/04/19 1136    Geoffery Lyons, MD 01/05/19 1412

## 2019-01-06 LAB — GC/CHLAMYDIA PROBE AMP (~~LOC~~) NOT AT ARMC
Chlamydia: NEGATIVE
Neisseria Gonorrhea: NEGATIVE

## 2019-01-07 ENCOUNTER — Encounter: Payer: Self-pay | Admitting: Cardiology

## 2019-01-07 NOTE — Progress Notes (Deleted)
Electrophysiology Office Note   Date:  01/07/2019   ID:  Ryan Long, DOB 10-25-1961, MRN [id number]  PCP:  Cain Saupe, MD  Cardiologist:  Bensimhon Primary Electrophysiologist:  Sani Madariaga [name], MD    No chief complaint on file.    History of Present Illness: Ryan Long is a 58 y.o. male who is being seen today for the evaluation of CHF at the request of [name]. Presenting today for electrophysiology evaluation. He has a history of tobacco abuse, alcohol abuse, bipolar 1, nonischemic cardiomyopathy, COPD, HIV, hyperlipidemia, hypertension.  His nonischemic cardiomyopathy was not felt due to HIV due to good CD4 counts.  He is no longer using cocaine.  He is try to cut back on smoking.  He is currently living in a shelter in [place].  Today, denies symptoms of palpitations, chest pain, shortness of breath, orthopnea, PND, lower extremity edema, claudication, dizziness, presyncope, syncope, bleeding, or neurologic sequela. The patient is tolerating medications without difficulties. ***   Past Medical History:  Diagnosis Date  . Active smoker   . AICD (automatic cardioverter/defibrillator) present   . Alcohol abuse   . Anxiety   . AR (allergic rhinitis)   . Bipolar 1 disorder (HCC)   . CHF (congestive heart failure) (HCC)   . Chronic bronchitis (HCC)   . Chronic systolic heart failure (HCC)   . COPD (chronic obstructive pulmonary disease) (HCC)   . Cough 12/31/2018  . Crack cocaine use   . Depression   . Genital herpes   . HIV (human immunodeficiency virus infection) (HCC) dx'd 08/1993  . HLD (hyperlipidemia)   . Hypertension   . NICM (nonischemic cardiomyopathy) (HCC)    Echocardiogram 06/28/11: EF 30-35%, mild MR, mild LAE;  No CAD by coronary CT angiogram 3/12 at [facility]  . NSVT (nonsustained ventricular tachycardia) (HCC)   . PTSD (post-traumatic stress disorder)    Past Surgical History:  Procedure Laterality Date    . CARDIAC DEFIBRILLATOR PLACEMENT  01/08/2018  . ICD IMPLANT N/A 01/08/2018   Procedure: ICD IMPLANT;  Surgeon: Regan Lemming, MD;  Location: [facility] INVASIVE CV LAB;  Service: Cardiovascular;  Laterality: N/A;     Current Outpatient Medications  Medication Sig Dispense Refill  . amoxicillin-clavulanate (AUGMENTIN) 875-125 MG tablet Take 1 tablet by mouth every 12 (twelve) hours for 7 days. 14 tablet 0  . aspirin 81 MG chewable tablet Chew 1 tablet (81 mg total) by mouth every morning. For heart health 30 tablet 11  . bictegravir-emtricitabine-tenofovir AF (BIKTARVY) 50-200-25 MG TABS tablet Take 1 tablet by mouth daily. 30 tablet 11  . digoxin (DIGOX) 0.125 MG tablet TAKE 1 TABLET BY MOUTH DAILY( FOR CONTROL OF HEART ARRYTHMIA) 90 tablet 0  . eplerenone (INSPRA) 25 MG tablet Take 0.5 tablets (12.5 mg total) by mouth daily. 15 tablet 11  . furosemide (LASIX) 40 MG tablet Take 1 tablet (40 mg total) by mouth daily. 90 tablet 1  . hydrOXYzine (ATARAX/VISTARIL) 25 MG tablet Take 1-2 tablets (25-50 mg total) by mouth 2 (two) times daily as needed for anxiety. 60 tablet 11  . losartan (COZAAR) 50 MG tablet TAKE 1 TABLET(50 MG) BY MOUTH DAILY 90 tablet 1  . metoprolol succinate (TOPROL-XL) 100 MG 24 hr tablet Take 1 tablet (100 mg total) by mouth at bedtime. For high blood pressure 30 tablet 11  . Multiple Vitamins-Minerals (MULTIVITAMIN WITH MINERALS) tablet Take 1 tablet by mouth daily.    . potassium chloride SA (K-DUR,KLOR-CON)  20 MEQ tablet Take 1 tablet (20 mEq total) by mouth daily. 90 tablet 1  . predniSONE (DELTASONE) 20 MG tablet Take 3 tablets (60 mg total) by mouth daily for 5 days. 15 tablet 0  . sildenafil (VIAGRA) 50 MG tablet Take 1 tablet (50 mg total) by mouth daily as needed for erectile dysfunction. At least 24 hours between doses 20 tablet 0  . valACYclovir (VALTREX) 500 MG tablet Take 1 tablet (500 mg total) by mouth daily. For Herpes. 30 tablet 1  . VENTOLIN HFA 108 (90  Base) MCG/ACT inhaler INHALE 2 PUFFS INTO THE LUNGS EVERY 6 HOURS AS NEEDED FOR WHEEZING OR SHORTNESS OF BREATH 18 g 1   No current facility-administered medications for this visit.     Allergies:   Patient has no known allergies.   Social History:  The patient  reports that he has been smoking cigarettes. He has a 19.00 pack-year smoking history. He has never used smokeless tobacco. He reports current alcohol use of about 6.0 standard drinks of alcohol per week. He reports current drug use. Drug: Cocaine.   Family History:  The patient's family history includes Alcohol abuse in his brother and father; CAD in his father; Drug abuse in his brother, brother, and father; Early death in his father; Glaucoma in his mother; Heart attack (age of onset: 1648) in his father; Hypertension in his father; Mental illness in his father and mother; Vision loss in his mother.    ROS:  Please see the history of present illness.   Otherwise, review of systems is positive for ***.   All other systems are reviewed and negative.   PHYSICAL EXAM: VS:  There were no vitals taken for this visit. , BMI There is no height or weight on file to calculate BMI. GEN: Well nourished, well developed, in no acute distress  HEENT: normal  Neck: no JVD, carotid bruits, or masses Cardiac: ***RRR; no murmurs, rubs, or gallops,no edema  Respiratory:  clear to auscultation bilaterally, normal work of breathing GI: soft, nontender, nondistended, + BS MS: no deformity or atrophy  Skin: warm and dry, ***device site well healed Neuro:  Strength and sensation are intact Psych: euthymic mood, full affect  EKG:  EKG {ACTION; IS/IS [id number]} ordered today. Personal review of the ekg ordered *** shows ***  ***Personal review of the device interrogation today. Results in [facility]   Recent Labs: 12/31/2018: ALT 23; B Natriuretic Peptide 40.9 01/04/2019: BUN 14; Creatinine, Ser 1.25; Hemoglobin 16.0; Platelets 163; Potassium 4.0;  Sodium 139    Lipid Panel     Component Value Date/Time   CHOL 150 04/18/2016 1622   TRIG 193 (H) 04/18/2016 1622   HDL 61 04/18/2016 1622   CHOLHDL 2.5 04/18/2016 1622   VLDL 39 (H) 04/18/2016 1622   LDLCALC 50 04/18/2016 1622     Wt Readings from Last 3 Encounters:  12/31/18 211 lb (95.7 kg)  03/19/18 200 lb (90.7 kg)  02/25/18 207 lb 12.8 oz (94.3 kg)      Other studies Reviewed: Additional studies/ records that were reviewed today include: TTE 10/10/17  Review of the above records today demonstrates:  - Left ventricle: The cavity size was normal. Wall thickness was   normal. Systolic function was severely reduced. The estimated   ejection fraction was in the range of 25% to 30%. Diffuse   hypokinesis. Doppler parameters are consistent with abnormal left   ventricular relaxation (grade 1 diastolic dysfunction).   ASSESSMENT AND PLAN:  1.  Chronic systolic heart failure due to nonischemic cardiomyopathy: ***Ejection fraction remains below 35%, he thus qualifies for ICD implant.  Currently on optimal medical therapy with Toprol-XL, losartan, and eplerenone.  He has been doing well although he does have some symptoms of fatigue and weakness as well as some shortness of breath.  I discussed with him the possibility of ICD implantation.  Risks and benefits include bleeding, infection, tamponade, pneumothorax, death, stroke, MI.  Also included are inappropriate shocks.  He understands these risks and is agreed to the procedure.  2.  HIV: ***Follow-up with Dr. Algis Liming.  No changes.  3.  Tobacco abuse: ***Encouraged complete cessation  4.  Cocaine abuse: ***Congratulated on continued abstinence.  Current medicines are reviewed at length with the patient today.   The patient does not have concerns regarding his medicines.  The following changes were made today:  ***  Labs/ tests ordered today include:  No orders of the defined types were placed in this  encounter.    Disposition:   FU with Melvie Paglia *** months  Signed, Samera Macy [name], MD  01/07/2019 10:38 AM     [facility] [address] [place] [address] [phone] (office) [phone] (fax)

## 2019-01-14 ENCOUNTER — Encounter (HOSPITAL_COMMUNITY): Payer: Medicare Other

## 2019-01-20 ENCOUNTER — Encounter (HOSPITAL_COMMUNITY): Payer: Medicare Other

## 2019-01-22 DIAGNOSIS — Z950 Presence of cardiac pacemaker: Secondary | ICD-10-CM | POA: Diagnosis not present

## 2019-01-22 DIAGNOSIS — F17210 Nicotine dependence, cigarettes, uncomplicated: Secondary | ICD-10-CM | POA: Diagnosis not present

## 2019-01-22 DIAGNOSIS — I509 Heart failure, unspecified: Secondary | ICD-10-CM | POA: Diagnosis present

## 2019-01-22 DIAGNOSIS — J40 Bronchitis, not specified as acute or chronic: Secondary | ICD-10-CM | POA: Diagnosis not present

## 2019-01-22 DIAGNOSIS — J441 Chronic obstructive pulmonary disease with (acute) exacerbation: Secondary | ICD-10-CM | POA: Diagnosis present

## 2019-01-22 DIAGNOSIS — R009 Unspecified abnormalities of heart beat: Secondary | ICD-10-CM | POA: Diagnosis present

## 2019-01-22 DIAGNOSIS — Z62811 Personal history of psychological abuse in childhood: Secondary | ICD-10-CM | POA: Diagnosis present

## 2019-01-22 DIAGNOSIS — I110 Hypertensive heart disease with heart failure: Secondary | ICD-10-CM | POA: Diagnosis present

## 2019-01-22 DIAGNOSIS — N400 Enlarged prostate without lower urinary tract symptoms: Secondary | ICD-10-CM | POA: Diagnosis present

## 2019-01-22 DIAGNOSIS — M79605 Pain in left leg: Secondary | ICD-10-CM | POA: Diagnosis not present

## 2019-01-22 DIAGNOSIS — F319 Bipolar disorder, unspecified: Secondary | ICD-10-CM | POA: Diagnosis not present

## 2019-01-22 DIAGNOSIS — R45851 Suicidal ideations: Secondary | ICD-10-CM | POA: Diagnosis present

## 2019-01-22 DIAGNOSIS — B20 Human immunodeficiency virus [HIV] disease: Secondary | ICD-10-CM | POA: Diagnosis not present

## 2019-01-22 DIAGNOSIS — Z590 Homelessness: Secondary | ICD-10-CM | POA: Diagnosis not present

## 2019-01-22 DIAGNOSIS — F29 Unspecified psychosis not due to a substance or known physiological condition: Secondary | ICD-10-CM | POA: Diagnosis not present

## 2019-01-22 DIAGNOSIS — R52 Pain, unspecified: Secondary | ICD-10-CM | POA: Diagnosis not present

## 2019-01-22 DIAGNOSIS — M79662 Pain in left lower leg: Secondary | ICD-10-CM | POA: Diagnosis not present

## 2019-01-22 DIAGNOSIS — Z21 Asymptomatic human immunodeficiency virus [HIV] infection status: Secondary | ICD-10-CM | POA: Diagnosis present

## 2019-01-30 DIAGNOSIS — B20 Human immunodeficiency virus [HIV] disease: Secondary | ICD-10-CM | POA: Diagnosis not present

## 2019-01-30 DIAGNOSIS — I509 Heart failure, unspecified: Secondary | ICD-10-CM | POA: Diagnosis not present

## 2019-01-30 DIAGNOSIS — F17210 Nicotine dependence, cigarettes, uncomplicated: Secondary | ICD-10-CM | POA: Diagnosis not present

## 2019-01-30 DIAGNOSIS — Z590 Homelessness: Secondary | ICD-10-CM | POA: Diagnosis not present

## 2019-01-30 DIAGNOSIS — J40 Bronchitis, not specified as acute or chronic: Secondary | ICD-10-CM | POA: Diagnosis not present

## 2019-01-30 DIAGNOSIS — I110 Hypertensive heart disease with heart failure: Secondary | ICD-10-CM | POA: Diagnosis not present

## 2019-01-30 DIAGNOSIS — M79605 Pain in left leg: Secondary | ICD-10-CM | POA: Diagnosis not present

## 2019-01-30 DIAGNOSIS — M79662 Pain in left lower leg: Secondary | ICD-10-CM | POA: Diagnosis not present

## 2019-02-02 ENCOUNTER — Other Ambulatory Visit (HOSPITAL_COMMUNITY): Payer: Self-pay | Admitting: Internal Medicine

## 2019-02-02 DIAGNOSIS — I5022 Chronic systolic (congestive) heart failure: Secondary | ICD-10-CM

## 2019-02-03 ENCOUNTER — Other Ambulatory Visit (HOSPITAL_COMMUNITY): Payer: Self-pay | Admitting: Internal Medicine

## 2019-02-03 DIAGNOSIS — R45851 Suicidal ideations: Secondary | ICD-10-CM | POA: Diagnosis not present

## 2019-02-03 DIAGNOSIS — F329 Major depressive disorder, single episode, unspecified: Secondary | ICD-10-CM | POA: Diagnosis not present

## 2019-02-03 DIAGNOSIS — F602 Antisocial personality disorder: Secondary | ICD-10-CM | POA: Diagnosis not present

## 2019-02-03 DIAGNOSIS — B20 Human immunodeficiency virus [HIV] disease: Secondary | ICD-10-CM | POA: Diagnosis not present

## 2019-02-03 DIAGNOSIS — M79662 Pain in left lower leg: Secondary | ICD-10-CM | POA: Diagnosis not present

## 2019-02-03 DIAGNOSIS — R5381 Other malaise: Secondary | ICD-10-CM | POA: Diagnosis not present

## 2019-02-03 DIAGNOSIS — F17210 Nicotine dependence, cigarettes, uncomplicated: Secondary | ICD-10-CM | POA: Diagnosis not present

## 2019-02-03 DIAGNOSIS — I5022 Chronic systolic (congestive) heart failure: Secondary | ICD-10-CM

## 2019-02-03 DIAGNOSIS — M79605 Pain in left leg: Secondary | ICD-10-CM | POA: Diagnosis not present

## 2019-02-04 DIAGNOSIS — B20 Human immunodeficiency virus [HIV] disease: Secondary | ICD-10-CM | POA: Diagnosis present

## 2019-02-04 DIAGNOSIS — I509 Heart failure, unspecified: Secondary | ICD-10-CM | POA: Diagnosis present

## 2019-02-04 DIAGNOSIS — M79662 Pain in left lower leg: Secondary | ICD-10-CM | POA: Diagnosis present

## 2019-02-04 DIAGNOSIS — Z79899 Other long term (current) drug therapy: Secondary | ICD-10-CM | POA: Diagnosis not present

## 2019-02-04 DIAGNOSIS — R45851 Suicidal ideations: Secondary | ICD-10-CM | POA: Diagnosis present

## 2019-02-04 DIAGNOSIS — I110 Hypertensive heart disease with heart failure: Secondary | ICD-10-CM | POA: Diagnosis present

## 2019-02-04 DIAGNOSIS — Z7982 Long term (current) use of aspirin: Secondary | ICD-10-CM | POA: Diagnosis not present

## 2019-02-04 DIAGNOSIS — F329 Major depressive disorder, single episode, unspecified: Secondary | ICD-10-CM | POA: Diagnosis not present

## 2019-02-04 DIAGNOSIS — M79605 Pain in left leg: Secondary | ICD-10-CM | POA: Diagnosis not present

## 2019-02-04 DIAGNOSIS — Z950 Presence of cardiac pacemaker: Secondary | ICD-10-CM | POA: Diagnosis not present

## 2019-02-04 DIAGNOSIS — F602 Antisocial personality disorder: Secondary | ICD-10-CM | POA: Diagnosis present

## 2019-02-04 DIAGNOSIS — F17210 Nicotine dependence, cigarettes, uncomplicated: Secondary | ICD-10-CM | POA: Diagnosis present

## 2019-02-04 DIAGNOSIS — Z590 Homelessness: Secondary | ICD-10-CM | POA: Diagnosis not present

## 2019-02-04 DIAGNOSIS — E781 Pure hyperglyceridemia: Secondary | ICD-10-CM | POA: Diagnosis present

## 2019-02-04 DIAGNOSIS — E876 Hypokalemia: Secondary | ICD-10-CM | POA: Diagnosis present

## 2019-02-10 ENCOUNTER — Other Ambulatory Visit (HOSPITAL_COMMUNITY): Payer: Self-pay | Admitting: Internal Medicine

## 2019-02-18 DIAGNOSIS — I4891 Unspecified atrial fibrillation: Secondary | ICD-10-CM | POA: Diagnosis not present

## 2019-02-18 DIAGNOSIS — M79605 Pain in left leg: Secondary | ICD-10-CM | POA: Diagnosis not present

## 2019-02-18 DIAGNOSIS — B20 Human immunodeficiency virus [HIV] disease: Secondary | ICD-10-CM | POA: Diagnosis not present

## 2019-02-18 DIAGNOSIS — I10 Essential (primary) hypertension: Secondary | ICD-10-CM | POA: Diagnosis not present

## 2019-02-18 DIAGNOSIS — Z6832 Body mass index (BMI) 32.0-32.9, adult: Secondary | ICD-10-CM | POA: Diagnosis not present

## 2019-02-18 DIAGNOSIS — I509 Heart failure, unspecified: Secondary | ICD-10-CM | POA: Diagnosis not present

## 2019-02-18 DIAGNOSIS — I2510 Atherosclerotic heart disease of native coronary artery without angina pectoris: Secondary | ICD-10-CM | POA: Diagnosis not present

## 2019-02-21 DIAGNOSIS — Z6832 Body mass index (BMI) 32.0-32.9, adult: Secondary | ICD-10-CM | POA: Diagnosis not present

## 2019-02-21 DIAGNOSIS — M7662 Achilles tendinitis, left leg: Secondary | ICD-10-CM | POA: Diagnosis not present

## 2019-02-21 DIAGNOSIS — B20 Human immunodeficiency virus [HIV] disease: Secondary | ICD-10-CM | POA: Diagnosis not present

## 2019-02-24 ENCOUNTER — Encounter (HOSPITAL_COMMUNITY): Payer: Self-pay

## 2019-03-04 DIAGNOSIS — R0600 Dyspnea, unspecified: Secondary | ICD-10-CM | POA: Diagnosis not present

## 2019-03-04 DIAGNOSIS — B20 Human immunodeficiency virus [HIV] disease: Secondary | ICD-10-CM | POA: Diagnosis not present

## 2019-03-04 DIAGNOSIS — I080 Rheumatic disorders of both mitral and aortic valves: Secondary | ICD-10-CM | POA: Diagnosis not present

## 2019-03-04 DIAGNOSIS — R0602 Shortness of breath: Secondary | ICD-10-CM | POA: Diagnosis not present

## 2019-03-04 DIAGNOSIS — I517 Cardiomegaly: Secondary | ICD-10-CM | POA: Diagnosis not present

## 2019-03-04 DIAGNOSIS — I5189 Other ill-defined heart diseases: Secondary | ICD-10-CM | POA: Diagnosis not present

## 2019-03-04 DIAGNOSIS — R000 Tachycardia, unspecified: Secondary | ICD-10-CM | POA: Diagnosis not present

## 2019-03-04 DIAGNOSIS — F315 Bipolar disorder, current episode depressed, severe, with psychotic features: Secondary | ICD-10-CM | POA: Diagnosis not present

## 2019-03-04 DIAGNOSIS — I509 Heart failure, unspecified: Secondary | ICD-10-CM | POA: Diagnosis not present

## 2019-03-05 DIAGNOSIS — F315 Bipolar disorder, current episode depressed, severe, with psychotic features: Secondary | ICD-10-CM | POA: Diagnosis not present

## 2019-03-06 DIAGNOSIS — F315 Bipolar disorder, current episode depressed, severe, with psychotic features: Secondary | ICD-10-CM | POA: Diagnosis not present

## 2019-03-07 DIAGNOSIS — F315 Bipolar disorder, current episode depressed, severe, with psychotic features: Secondary | ICD-10-CM | POA: Diagnosis not present

## 2019-03-08 DIAGNOSIS — F315 Bipolar disorder, current episode depressed, severe, with psychotic features: Secondary | ICD-10-CM | POA: Diagnosis not present

## 2019-03-09 DIAGNOSIS — F315 Bipolar disorder, current episode depressed, severe, with psychotic features: Secondary | ICD-10-CM | POA: Diagnosis not present

## 2019-03-10 ENCOUNTER — Telehealth (HOSPITAL_COMMUNITY): Payer: Self-pay | Admitting: Licensed Clinical Social Worker

## 2019-03-10 DIAGNOSIS — F315 Bipolar disorder, current episode depressed, severe, with psychotic features: Secondary | ICD-10-CM | POA: Diagnosis not present

## 2019-03-10 NOTE — Telephone Encounter (Signed)
Patient called stating he is currently in the [facility] unit. He anticipates being discharged later today. He has been referred to a halfway house in [place] post hospitalization but reports he has no food and asking CSW for assistance. Patient will not arrive to halfway until later today. Patient states he will call CSW in the morning after he gets aquatinted with his new housing and options for cooking. Lasandra Beech, LCSW, CCSW-MCS [phone]

## 2019-03-11 ENCOUNTER — Telehealth (HOSPITAL_COMMUNITY): Payer: Self-pay | Admitting: Licensed Clinical Social Worker

## 2019-03-11 NOTE — Telephone Encounter (Signed)
Patient called CSW today and left message for return call. CSW attempted multiple times to return call and unable to leave message. Lasandra Beech, LCSW, CCSW-MCS [phone]

## 2019-03-17 ENCOUNTER — Encounter: Payer: Self-pay | Admitting: Family Medicine

## 2019-03-17 ENCOUNTER — Other Ambulatory Visit: Payer: Self-pay

## 2019-03-17 ENCOUNTER — Ambulatory Visit: Payer: Medicare Other | Attending: Family Medicine | Admitting: Family Medicine

## 2019-03-17 DIAGNOSIS — M25572 Pain in left ankle and joints of left foot: Secondary | ICD-10-CM | POA: Diagnosis not present

## 2019-03-17 DIAGNOSIS — G8929 Other chronic pain: Secondary | ICD-10-CM | POA: Diagnosis not present

## 2019-03-17 DIAGNOSIS — I5022 Chronic systolic (congestive) heart failure: Secondary | ICD-10-CM

## 2019-03-17 DIAGNOSIS — I428 Other cardiomyopathies: Secondary | ICD-10-CM | POA: Diagnosis not present

## 2019-03-17 DIAGNOSIS — J449 Chronic obstructive pulmonary disease, unspecified: Secondary | ICD-10-CM

## 2019-03-17 DIAGNOSIS — B009 Herpesviral infection, unspecified: Secondary | ICD-10-CM

## 2019-03-17 MED ORDER — VENTOLIN HFA 108 (90 BASE) MCG/ACT IN AERS: INHALATION_SPRAY | RESPIRATORY_TRACT | 3 refills | Status: DC

## 2019-03-17 MED ORDER — DICLOFENAC SODIUM 1 % TD GEL: 4.0000 g | Freq: Four times a day (QID) | TRANSDERMAL | 3 refills | Status: DC

## 2019-03-17 MED ORDER — VALACYCLOVIR HCL 500 MG PO TABS: 500.0000 mg | ORAL_TABLET | Freq: Every day | ORAL | 1 refills | Status: DC

## 2019-03-17 MED ORDER — DIGOXIN 125 MCG PO TABS: ORAL_TABLET | ORAL | 0 refills | Status: DC

## 2019-03-17 NOTE — Progress Notes (Signed)
Patient verified DOB Patient has taken medication today. Patient has eaten today. Patient complains of pain in the lower left ankle. Pain has been present for 7 weeks and scales the pain at 8. Patient states the pain is easier in the morning and increases with exertion. Patient states he has a boot but it does not relieve pressure and patient states he uses the boot daily. Patient states pain radiates up mid way to the calf. Patient states no DVT was noted but a torn achilles tendon was noted. Patient states Ventolin is not helping with breathing concerns.

## 2019-03-17 NOTE — Progress Notes (Signed)
Virtual Visit via Telephone Note  Due to current restrictions/limitations of-office visits due to the time but 19 pandemic, this scheduled clinical appointment was converted to a telehealth visit  I connected with Ryan Long on 03/17/19 at 3:36 PM EST by telephone and verified that I am speaking with the correct person using two identifiers.   I discussed the limitations, risks, security and privacy concerns of performing an evaluation and management service by telephone and the availability of in person appointments. I also discussed with the patient that there may be a patient responsible charge related to this service. The patient expressed understanding and agreed to proceed.   History of Present Illness  58 year old male with past medical history significant for hypertension, chronic systolic heart failure, nonischemic cardiomyopathy, major depressive disorder, bipolar disorder and polysubstance abuse new to me as a patient who was last seen here in the office by another provider on 01/21/2018.  Because of the current COVID-19 outbreak, patient's visit was done as a telehealth visit.  Patient reports that he has had 7 weeks of pain in his left lower leg/ankle area.  Patient reports that he was seen in the emergency department and had imaging done to rule out presence of a blood clot and was told that his pain was secondary to an injury to his Achilles tendon.  Patient does not recall any specific injury.  Patient states that he was prescribed a boot which has not helped.  Patient states that the more that he walks, the more pain he has.  Patient states that his current pain is about a 8-8-1/2 on a 0-to-10 scale with 0 being no pain and 10 being the worst pain imaginable.  Patient states that the pain is steady and sharp.  Patient has been taking over-the-counter Tylenol without relief of his pain.  Patient also requests refills of several medications including digoxin.  Patient denies  any chest pain, no palpitations and no peripheral edema.  Patient believes that his heart failure is currently stable.  Patient has had no firing of his implanted defibrillator.  Patient would like refill of albuterol to use as needed for chest tightness or shortness of breath.  Patient currently with occasional mild shortness of breath which he does not believe is related to his CHF.Patient reports that he continues to smoke on a daily basis.  Patient reports that he needs refill of Valtrex which he takes daily for HSV.   Observations/Objective: No vital signs or physical exam done at today's visit as visit was done by phone Review of Systems  Constitutional: Positive for malaise/fatigue. Negative for chills and fever.  HENT: Negative for congestion, sinus pain and sore throat.   Eyes: Negative for blurred vision and double vision.  Respiratory: Positive for shortness of breath (occasionally). Negative for cough.   Cardiovascular: Negative for chest pain, palpitations, orthopnea and leg swelling.  Gastrointestinal: Negative for abdominal pain, nausea and vomiting.  Genitourinary: Negative for dysuria and frequency.  Musculoskeletal: Positive for joint pain and myalgias.   Assessment and Plan: 1. Chronic pain of left ankle Patient with complaint of pain in his left ankle and lower leg for the past 7 weeks and reports that he had an ED evaluation and was given a boot and was told that the pain is due to an issue/injury to his Achilles tendon. RX will be sent to patient's pharmacy for Voltaren gel to use for pain (on review of chart, patient with long-standing issues with poly-substance abuse) - diclofenac  sodium (VOLTAREN) 1 % GEL; Apply 4 g topically 4 (four) times daily. To area of pain  Dispense: 1 Tube; Refill: 3 - AMB referral to orthopedics  2. NICM (nonischemic cardiomyopathy) (HCC) Per chart review, patient has missed all of his recent appointments with cardiology at [facility] and will be referred to a local cardiologist whom he has seen in the past here in [place] - Ambulatory referral to Cardiology  3. Chronic systolic heart failure (HCC) Will be referred to cardiology in follow-up of chronic systolic heart failure.  Patient given 30-day supply of digoxin and hopefully he will be able to reestablish with cardiology within the next 30 days - Ambulatory referral to Cardiology - digoxin (LANOXIN) 0.125 MG tablet; TAKE 1 TABLET BY MOUTH EVERY DAY FOR HEART ARRHYTHMIA; follow-up with cardiology for refills  Dispense: 30 tablet; Refill: 0  4. Chronic obstructive pulmonary disease, unspecified COPD type (HCC) Per chart patient with history of COPD and patient requested refill of his inhaler.  New prescription provided for albuterol - VENTOLIN HFA 108 (90 Base) MCG/ACT inhaler; INHALE 2 PUFFS INTO THE LUNGS EVERY 6 HOURS AS NEEDED FOR WHEEZING OR SHORTNESS OF BREATH  Dispense: 18 g; Refill: 3  5. HSV infection Patient with history of HSV infection and requested refill of valacyclovir which was sent to patient's pharmacy. Continue infectious disease follow-up of HIV - valACYclovir (VALTREX) 500 MG tablet; Take 1 tablet (500 mg total) by mouth daily. For Herpes.  Dispense: 30 tablet; Refill: 1  *Discussed with patient that if possible (depending on COVID-19) that I would like for him to come into the clinic for an in-person visit along with labs in follow-up of his chronic medical conditions   Follow Up Instructions:Return in about 2 months (around 05/17/2019) for chronic issues and labs.    I discussed the assessment and treatment plan with the patient. The patient was provided an opportunity to ask questions and all were answered. The patient agreed with the plan and demonstrated an understanding of the instructions.   The patient was advised to call back or seek an in-person evaluation if the symptoms worsen or if the condition fails to improve as  anticipated.  I provided 14 minutes of non-face-to-face time during this encounter.   Cain Saupe, MD

## 2019-03-24 ENCOUNTER — Other Ambulatory Visit: Payer: Self-pay

## 2019-03-24 ENCOUNTER — Ambulatory Visit (INDEPENDENT_AMBULATORY_CARE_PROVIDER_SITE_OTHER): Payer: Medicare Other | Admitting: Orthopedic Surgery

## 2019-03-24 ENCOUNTER — Encounter (INDEPENDENT_AMBULATORY_CARE_PROVIDER_SITE_OTHER): Payer: Self-pay | Admitting: Orthopedic Surgery

## 2019-03-24 VITALS — Ht 68.0 in | Wt 211.0 lb

## 2019-03-24 DIAGNOSIS — M7672 Peroneal tendinitis, left leg: Secondary | ICD-10-CM | POA: Diagnosis not present

## 2019-03-24 DIAGNOSIS — M7662 Achilles tendinitis, left leg: Secondary | ICD-10-CM | POA: Diagnosis not present

## 2019-03-24 NOTE — Progress Notes (Signed)
Office Visit Note   Patient: Ryan Long           Date of Birth: 02/09/1961           MRN: [id number] Visit Date: 03/24/2019              Requested by: Cain Saupe, MD [address] [place], [place] [address] PCP: Cain Saupe, MD  Chief Complaint  Patient presents with  . Left Ankle - Pain      HPI: Patient is a 58 year old gentleman is seen for initial evaluation for tendinitis left ankle.  Patient states he has had pain for several months pain with ambulation pain with weightbearing he states he did go to the emergency room and ultrasound was negative for DVT.  Patient denies any trauma.  Patient states he was given a fracture boot and this caused more pain.  Assessment & Plan: Visit Diagnoses:  1. Achilles tendinitis, left leg   2. Peroneal tendinitis of left lower leg     Plan: Patient was given a 916 inch heel lift.  He was able to ambulate with this in his sneaker and he states that this did feel better.  Will reevaluate at follow-up.  Continue using the Voltaren gel discussed that we may need to consider a steroid injection.  Follow-Up Instructions: Return in about 4 weeks (around 04/21/2019).   Ortho Exam  Patient is alert, oriented, no adenopathy, well-dressed, normal affect, normal respiratory effort. Examination patient has a good pulse he has dorsiflexion about 10 degrees past neutral with his knee extended.  The anterior aspect of the ankle is nontender to palpation the sinus Tarsi is nontender to palpation he has good ankle and good subtalar motion.  Patient is tender to palpation along the Achilles tendon but there are no nodular changes no palpable defect.  Compression of the calf reproduces plantarflexion of the foot.  Patient is also tender to palpation over the peroneal tendons there is a mild amount of swelling and no palpable defect.  Imaging: No results found. No images are attached to the encounter.  Labs: Lab Results  Component Value  Date   HGBA1C 5.3 09/24/2015   HGBA1C 5.3 08/08/2015   HGBA1C 5.3 06/29/2011   REPTSTATUS 03/25/2017 FINAL 03/20/2017   CULT NO GROWTH 5 DAYS 03/20/2017     Lab Results  Component Value Date   ALBUMIN 3.8 12/31/2018   ALBUMIN 4.1 02/25/2018   ALBUMIN 4.4 04/07/2017    Body mass index is 32.08 kg/m.  Orders:  No orders of the defined types were placed in this encounter.  No orders of the defined types were placed in this encounter.    Procedures: No procedures performed  Clinical Data: No additional findings.  ROS:  All other systems negative, except as noted in the HPI. Review of Systems  Objective: Vital Signs: Ht 5\' 8"  (1.727 m)   Wt 211 lb (95.7 kg)   BMI 32.08 kg/m   Specialty Comments:  No specialty comments available.  PMFS History: Patient Active Problem List   Diagnosis Date Noted  . Cough 12/31/2018  . Controlled substance agreement signed 10/22/2018  . AICD (automatic cardioverter/defibrillator) present 08/15/2018  . Abnormal EKG 06/28/2018  . Cocaine abuse (HCC) 06/25/2018  . Encounter for other general examination 06/25/2018  . Current severe episode of major depressive disorder with psychotic features without prior episode (HCC) 06/24/2018  . MDD (major depressive disorder), recurrent episode, moderate (HCC) 06/06/2018  . Current moderate episode of major  depressive disorder (HCC) 05/24/2018  . Adjustment disorder with mixed disturbance of emotions and conduct   . NICM (nonischemic cardiomyopathy) (HCC) 01/08/2018  . Suicidal ideation 12/25/2017  . Anxiety 12/20/2017  . Bipolar 1 disorder (HCC) 10/10/2017  . Breast lump 10/03/2017  . Chronic systolic heart failure (HCC) 05/13/2017  . Genital herpes 05/13/2017  . HSV-2 infection 07/17/2016  . 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 Nonpsychotic mental disorder 06/22/2016  . Screening examination for venereal disease  04/18/2016  . Encounter for long-term (current) use of medications 04/18/2016  . History of attempted suicide 04/14/2016  . History of alcoholism (HCC) 04/14/2016  . History of substance abuse (HCC) 04/14/2016  . Constipation 01/17/2016  . Homelessness 01/11/2016  . Major depressive disorder, recurrent, unspecified (HCC) 10/08/2015  . Chronic obstructive pulmonary disease (HCC) 10/04/2015  . Abnormal thyroid stimulating hormone (TSH) level 10/04/2015  . MDD (major depressive disorder), recurrent episode, severe (HCC) 09/23/2015  . Hypertension 09/10/2015  . MDD (major depressive disorder), recurrent, severe, with psychosis (HCC) 08/07/2015  . PTSD (post-traumatic stress disorder) 08/07/2015  . Cocaine use disorder, severe, dependence (HCC) 08/07/2015  . Major depressive disorder, recurrent severe without psychotic features (HCC) 04/26/2015  . Primary insomnia 04/02/2015  . Herpesviral infection of penis 04/02/2015  . Stimulant use disorder 05/29/2014  . Generalized anxiety disorder 05/29/2014  . Nondependent cannabis abuse, episodic 02/16/2014  . Cannabis abuse, uncomplicated 02/16/2014  . Polysubstance (excluding opioids) dependence with physiological dependence (HCC) 12/30/2013  . Substance induced mood disorder (HCC) 11/22/2013  . Tobacco use disorder 02/17/2013  . Cocaine abuse, episodic use (HCC) 03/20/2012    Class: Acute  . Alcohol dependence (HCC) 03/20/2012    Class: Acute  . Alcohol use disorder, severe, dependence (HCC) 03/20/2012  . Heart failure (HCC) 09/19/2011  . Neutropenia (HCC) 09/19/2011  . Acute congestive heart failure (HCC) 09/19/2011  . Alcohol use with alcohol-induced disorder (HCC) 09/19/2011  . Major depressive disorder, single episode, unspecified 09/17/2011  . Other primary cardiomyopathies 08/24/2011  . Human immunodeficiency virus (HIV) disease (HCC) 02/01/2009  . Recurrent HSV (herpes simplex virus) 02/01/2009  . Hyperlipidemia 02/01/2009  .  Allergic rhinitis 02/01/2009   Past Medical History:  Diagnosis Date  . Active smoker   . AICD (automatic cardioverter/defibrillator) present   . Alcohol abuse   . Anxiety   . AR (allergic rhinitis)   . Bipolar 1 disorder (HCC)   . CHF (congestive heart failure) (HCC)   . Chronic bronchitis (HCC)   . Chronic systolic heart failure (HCC)   . COPD (chronic obstructive pulmonary disease) (HCC)   . Cough 12/31/2018  . Crack cocaine use   . Depression   . Genital herpes   . HIV (human immunodeficiency virus infection) (HCC) dx'd 08/1993  . HLD (hyperlipidemia)   . Hypertension   . NICM (nonischemic cardiomyopathy) (HCC)    Echocardiogram 06/28/11: EF 30-35%, mild MR, mild LAE;  No CAD by coronary CT angiogram 3/12 at [facility]  . NSVT (nonsustained ventricular tachycardia) (HCC)   . PTSD (post-traumatic stress disorder)     Family History  Problem Relation Age of Onset  . Glaucoma Mother   . Mental illness Mother   . Vision loss Mother   . Hypertension Father   . CAD Father   . Mental illness Father   . Alcohol abuse Father   . Drug abuse Father   . Heart attack Father 59  . Early death Father   .  Alcohol abuse Brother   . Drug abuse Brother   . Drug abuse Brother     Past Surgical History:  Procedure Laterality Date  . CARDIAC DEFIBRILLATOR PLACEMENT  01/08/2018  . ICD IMPLANT N/A 01/08/2018   Procedure: ICD IMPLANT;  Surgeon: Regan Lemmingamnitz, Will Martin, MD;  [facility];  Service: Cardiovascular;  Laterality: N/A;   Social History   Occupational History  . Not on file  Tobacco Use  . Smoking status: Current Every Day Smoker    Packs/day: 0.50    Years: 38.00    Pack years: 19.00    Types: Cigarettes  . Smokeless tobacco: Never Used  Substance and Sexual Activity  . Alcohol use: Yes    Alcohol/week: 6.0 standard drinks    Types: 6 Cans of beer per week    Comment: 01/08/2018 "stopped 06/2017"  . Drug use: Yes    Types: Cocaine     Comment: 01/08/2018 "stopped 06/2017"  . Sexual activity: Yes    Birth control/protection: Condom    Comment: pt. given condoms

## 2019-04-01 ENCOUNTER — Telehealth: Payer: Self-pay | Admitting: *Deleted

## 2019-04-01 ENCOUNTER — Telehealth (HOSPITAL_COMMUNITY): Payer: Self-pay

## 2019-04-01 NOTE — Telephone Encounter (Signed)
Patient verbally agreed for consent to do virtual visit with Dr. Elberta Fortis.  Patient also stated he might not be able to do it because he has never done a virtual visit before. I went over instructions on what will take place again with patient and reassured him if we have to we will walk him through the steps of getting to the virtual visit piece. Patient asked if we could refer him to another Congestive Heart Failure Dr because he does not like where he is going. He asked if he could be seen here in the office instead of the hospital.  I explained to the patient CHF Clinic is where ALL of the Heart Failure Patients go within [facility]. If he wants to go to another Dr, he might have to be referred to someone outside of [facility].  He wants to stay within [facility] but not at the CHF Clinic at [facility] he does not like it there.  Patient then states he has been having some SHOB with no other symptoms and wants Dr Elberta Fortis to refer him to another Heart Doctor that is not across the street.  I advised patient that can be talked about on his virtual visit with Dr. Elberta Fortis April 21st @ 3:15pm. I advised patient appointment is not a rolling appointment and the time is set for 3:15pm as if he will be seen in the office.  Patient agreed and said he will ask Dr. Elberta Fortis about a referral.

## 2019-04-01 NOTE — Telephone Encounter (Signed)
Pt called c/o dizziness and lightheadedness that began yesterday, no LOC, stated he was med compliant and stable weight. Unable to check BP, requested appt (last seen by Amy) set up telehealth visit for 4/27 @1 :30. Advised to visit ED if worsening sx

## 2019-04-01 NOTE — Telephone Encounter (Signed)
Virtual Visit Pre-Appointment Phone Call  Steps For Call:  1. Confirm consent - "In the setting of the current Covid19 crisis, you are scheduled for a (phone or video) visit with your provider on (date) at (time).  Just as we do with many in-office visits, in order for you to participate in this visit, we must obtain consent.  If you'd like, I can send this to your mychart (if signed up) or email for you to review.  Otherwise, I can obtain your verbal consent now.  All virtual visits are billed to your insurance company just like a normal visit would be.  By agreeing to a virtual visit, we'd like you to understand that the technology does not allow for your provider to perform an examination, and thus may limit your provider's ability to fully assess your condition.  Finally, though the technology is pretty good, we cannot assure that it will always work on either your or our end, and in the setting of a video visit, we may have to convert it to a phone-only visit.  In either situation, we cannot ensure that we have a secure connection.  Are you willing to proceed?" YES  2. Confirm the BEST phone number to call the day of the visit: [phone]  3. Give patient instructions for WebEx/MyChart download to smartphone as below or Doximity/Doxy.me if video visit (depending on what platform provider is using)  4. Advise patient to be prepared with any vital sign or heart rhythm information, their current medicines, and a piece of paper and pen handy for any instructions they may receive the day of their visit  5. Inform patient they will receive a phone call 15 minutes prior to their appointment time (may be from unknown caller ID) so they should be prepared to answer  6. Confirm that appointment type is correct in Epic appointment notes (video vs telephone)     TELEPHONE CALL NOTE  Ryan Long has been deemed a candidate for a follow-up tele-health visit to limit community exposure  during the Covid-19 pandemic. I spoke with the patient via phone to ensure availability of phone/video source, confirm preferred email & phone number, and discuss instructions and expectations.  I reminded Ryan Long to be prepared with any vital sign and/or heart rhythm information that could potentially be obtained via home monitoring, at the time of his visit. I reminded Ryan Long to expect a phone call at the time of his visit if his visit.  Shakyia Bosso 04/01/2019 2:35 PM   DOWNLOADING THE WEBEX APP TO SMARTPHONE  - If Apple, ask patient to go to App Store and type in WebEx in the search bar. Download [employer] First Data CorporationWebex Meetings, the blue/green circle. If Android, go to Universal Healthoogle Play Store and type in Wm. Wrigley Jr. CompanyWebEx in the search bar. The app is free but as with any other app downloads, their phone may require them to verify saved payment information or Apple/Android password.  - The patient does NOT have to create an account. - On the day of the visit, the assist will walk the patient through joining the meeting with the meeting number/password.  DOWNLOADING THE MYCHART APP TO SMARTPHONE  - If Apple, go to San[employer]SC[employer] Store and type in MyChart in the search bar and download the app. If Android, ask patient to go to Universal Healthoogle Play Store and type in [place] in the search bar and download the app. The app is free but as with any other app  downloads, their phone may require them to verify saved payment information or Apple/Android password.  - The patient will need to then log into the app with their MyChart username and password, and select [place] as their healthcare provider to link the account. When it is time for your visit, go to the MyChart app, find appointments, and click Begin Video Visit. Be sure to Select Allow for your device to access the Microphone and Camera for your visit. You will then be connected, and your provider will be with you shortly.  **If they have any issues  connecting, or need assistance please contact MyChart service desk [phone])**  **If using a computer, in order to ensure the best quality for your visit they will need to use either of the following Internet Browsers: [employer] [place], or Google Chrome**  FULL LENGTH CONSENT FOR TELE-HEALTH VISIT   I hereby voluntarily request, consent and authorize [facility] and its employed or contracted physicians, physician assistants, nurse practitioners or other licensed health care professionals (the Practitioner), to provide me with telemedicine health care services (the Services") as deemed necessary by the treating Practitioner. I acknowledge and consent to receive the Services by the Practitioner via telemedicine. I understand that the telemedicine visit will involve communicating with the Practitioner through live audiovisual communication technology and the disclosure of certain medical information by electronic transmission. I acknowledge that I have been given the opportunity to request an in-person assessment or other available alternative prior to the telemedicine visit and am voluntarily participating in the telemedicine visit.  I understand that I have the right to withhold or withdraw my consent to the use of telemedicine in the course of my care at any time, without affecting my right to future care or treatment, and that the Practitioner or I may terminate the telemedicine visit at any time. I understand that I have the right to inspect all information obtained and/or recorded in the course of the telemedicine visit and may receive copies of available information for a reasonable fee.  I understand that some of the potential risks of receiving the Services via telemedicine include:   Delay or interruption in medical evaluation due to technological equipment failure or disruption;  Information transmitted may not be sufficient (e.g. poor resolution of images) to allow for  appropriate medical decision making by the Practitioner; and/or   In rare instances, security protocols could fail, causing a breach of personal health information.  Furthermore, I acknowledge that it is my responsibility to provide information about my medical history, conditions and care that is complete and accurate to the best of my ability. I acknowledge that Practitioner's advice, recommendations, and/or decision may be based on factors not within their control, such as incomplete or inaccurate data provided by me or distortions of diagnostic images or specimens that may result from electronic transmissions. I understand that the practice of medicine is not an exact science and that Practitioner makes no warranties or guarantees regarding treatment outcomes. I acknowledge that I will receive a copy of this consent concurrently upon execution via email to the email address I last provided but may also request a printed copy by calling the office of [facility].    I understand that my insurance will be billed for this visit.   I have read or had this consent read to me.  I understand the contents of this consent, which adequately explains the benefits and risks of the Services being provided via telemedicine.   I  have been provided ample opportunity to ask questions regarding this consent and the Services and have had my questions answered to my satisfaction.  I give my informed consent for the services to be provided through the use of telemedicine in my medical care  By participating in this telemedicine visit I agree to the above. PATIENT VERBALLY AGREED CONSENT

## 2019-04-03 DIAGNOSIS — M7662 Achilles tendinitis, left leg: Secondary | ICD-10-CM | POA: Insufficient documentation

## 2019-04-04 NOTE — Telephone Encounter (Signed)
Virtual Visit Pre-Appointment Phone Call  Steps For Call:  1. Confirm consent - "In the setting of the current Covid19 crisis, you are scheduled for a (phone or video) visit with your provider on (date) at (time).  Just as we do with many in-office visits, in order for you to participate in this visit, we must obtain consent.  If you'd like, I can send this to your mychart (if signed up) or email for you to review.  Otherwise, I can obtain your verbal consent now.  All virtual visits are billed to your insurance company just like a normal visit would be.  By agreeing to a virtual visit, we'd like you to understand that the technology does not allow for your provider to perform an examination, and thus may limit your provider's ability to fully assess your condition. If your provider identifies any concerns that need to be evaluated in person, we will make arrangements to do so.  Finally, though the technology is pretty good, we cannot assure that it will always work on either your or our end, and in the setting of a video visit, we may have to convert it to a phone-only visit.  In either situation, we cannot ensure that we have a secure connection.  Are you willing to proceed?" STAFF: Did the patient verbally acknowledge consent to telehealth visit? Document YES/NO here: YES  2. Confirm the BEST phone number to call the day of the visit by including in appointment notes  3. Give patient instructions for WebEx/MyChart download to smartphone as below or Doximity/Doxy.me if video visit (depending on what platform provider is using)  4. Advise patient to be prepared with their blood pressure, heart rate, weight, any heart rhythm information, their current medicines, and a piece of paper and pen handy for any instructions they may receive the day of their visit  5. Inform patient they will receive a phone call 15 minutes prior to their appointment time (may be from unknown caller ID) so they should be  prepared to answer  6. Confirm that appointment type is correct in Epic appointment notes (VIDEO vs PHONE)     TELEPHONE CALL NOTE  Ryan Long has been deemed a candidate for a follow-up tele-health visit to limit community exposure during the Covid-19 pandemic. I spoke with the patient via phone to ensure availability of phone/video source, confirm preferred email & phone number, and discuss instructions and expectations.  I reminded Iquan Sedam to be prepared with any vital sign and/or heart rhythm information that could potentially be obtained via home monitoring, at the time of his visit. I reminded Jocob Zaloudek to expect a phone call at the time of his visit if his visit.  Baird Lyons, RN 04/04/2019 5:25 PM   INSTRUCTIONS FOR DOWNLOADING THE WEBEX APP TO SMARTPHONE  - If Apple, ask patient to go to [employer] and type in WebEx in the search bar. Download [employer], the blue/green circle. If Android, go to Universal Health and type in [employer] in the search bar. The app is free but as with any other app downloads, their phone may require them to verify saved payment information or Apple/Android password.  - The patient does NOT have to create an account. - On the day of the visit, the assist will walk the patient through joining the meeting with the meeting number/password.  INSTRUCTIONS FOR DOWNLOADING THE MYCHART APP TO SMARTPHONE  - The patient must first  make sure to have activated MyChart and know their login information - If Apple, go to [employer] and type in MyChart in the search bar and download the app. If Android, ask patient to go to [employer] and type in [place] in the search bar and download the app. The app is free but as with any other app downloads, their phone may require them to verify saved payment information or Apple/Android password.  - The patient will need to then log into the app with their MyChart username and  password, and select [place] as their healthcare provider to link the account. When it is time for your visit, go to the MyChart app, find appointments, and click Begin Video Visit. Be sure to Select Allow for your device to access the Microphone and Camera for your visit. You will then be connected, and your provider will be with you shortly.  **If they have any issues connecting, or need assistance please contact MyChart service desk [phone])**  **If using a computer, in order to ensure the best quality for their visit they will need to use either of the following Internet Browsers: [employer], or Google Chrome**  IF USING DOXIMITY or DOXY.ME - The patient will receive a link just prior to their visit, either by text or email (to be determined day of appointment depending on if it's doxy.me or Doximity).     FULL LENGTH CONSENT FOR TELE-HEALTH VISIT   I hereby voluntarily request, consent and authorize [facility] and its employed or contracted physicians, physician assistants, nurse practitioners or other licensed health care professionals (the Practitioner), to provide me with telemedicine health care services (the "Services") as deemed necessary by the treating Practitioner. I acknowledge and consent to receive the Services by the Practitioner via telemedicine. I understand that the telemedicine visit will involve communicating with the Practitioner through live audiovisual communication technology and the disclosure of certain medical information by electronic transmission. I acknowledge that I have been given the opportunity to request an in-person assessment or other available alternative prior to the telemedicine visit and am voluntarily participating in the telemedicine visit.  I understand that I have the right to withhold or withdraw my consent to the use of telemedicine in the course of my care at any time, without affecting my right to future care or  treatment, and that the Practitioner or I may terminate the telemedicine visit at any time. I understand that I have the right to inspect all information obtained and/or recorded in the course of the telemedicine visit and may receive copies of available information for a reasonable fee.  I understand that some of the potential risks of receiving the Services via telemedicine include:  Marland Kitchen Delay or interruption in medical evaluation due to technological equipment failure or disruption; . Information transmitted may not be sufficient (e.g. poor resolution of images) to allow for appropriate medical decision making by the Practitioner; and/or  . In rare instances, security protocols could fail, causing a breach of personal health information.  Furthermore, I acknowledge that it is my responsibility to provide information about my medical history, conditions and care that is complete and accurate to the best of my ability. I acknowledge that Practitioner's advice, recommendations, and/or decision may be based on factors not within their control, such as incomplete or inaccurate data provided by me or distortions of diagnostic images or specimens that may result from electronic transmissions. I understand that the practice of medicine is not an  exact science and that Practitioner makes no warranties or guarantees regarding treatment outcomes. I acknowledge that I will receive a copy of this consent concurrently upon execution via email to the email address I last provided but may also request a printed copy by calling the office of [place].    I understand that my insurance will be billed for this visit.   I have read or had this consent read to me. . I understand the contents of this consent, which adequately explains the benefits and risks of the Services being provided via telemedicine.  . I have been provided ample opportunity to ask questions regarding this consent and the Services and have had my  questions answered to my satisfaction. . I give my informed consent for the services to be provided through the use of telemedicine in my medical care  By participating in this telemedicine visit I agree to the above.

## 2019-04-08 ENCOUNTER — Encounter: Payer: Medicare Other | Admitting: Cardiology

## 2019-04-08 ENCOUNTER — Other Ambulatory Visit: Payer: Self-pay

## 2019-04-08 ENCOUNTER — Other Ambulatory Visit (HOSPITAL_COMMUNITY): Payer: Self-pay

## 2019-04-08 DIAGNOSIS — I5022 Chronic systolic (congestive) heart failure: Secondary | ICD-10-CM

## 2019-04-08 MED ORDER — DIGOXIN 125 MCG PO TABS: ORAL_TABLET | ORAL | 0 refills | Status: DC

## 2019-04-08 NOTE — Progress Notes (Signed)
This encounter was created in error - please disregard.

## 2019-04-10 ENCOUNTER — Other Ambulatory Visit (HOSPITAL_COMMUNITY): Payer: Self-pay | Admitting: Internal Medicine

## 2019-04-10 DIAGNOSIS — I5022 Chronic systolic (congestive) heart failure: Secondary | ICD-10-CM

## 2019-04-14 ENCOUNTER — Other Ambulatory Visit: Payer: Self-pay

## 2019-04-14 ENCOUNTER — Ambulatory Visit (HOSPITAL_COMMUNITY)
Admission: RE | Admit: 2019-04-14 | Discharge: 2019-04-14 | Disposition: A | Discharge status: Home / Self Care | Payer: Medicare Other | Source: Ambulatory Visit | Attending: Internal Medicine | Admitting: Internal Medicine

## 2019-04-16 ENCOUNTER — Telehealth (HOSPITAL_COMMUNITY): Payer: Self-pay

## 2019-04-16 NOTE — Telephone Encounter (Signed)
Received a fax from the [facility] about refilling his medication for Eplerenone 25mg  (sig: 1/2 tab QD.) However, patient did not have his virtual visit with Dr. Gala Romney on 04/14/2019. Ryan Long did see his cardiologist on 04/09/2019 and was told to continue the medication 1 tab QD instead of 1/2tab. Need clarification on if patient is taking 25mg  (1) QD or (12.5) 1/2 tab QD.

## 2019-04-18 ENCOUNTER — Other Ambulatory Visit: Payer: Self-pay | Admitting: *Deleted

## 2019-04-18 DIAGNOSIS — I159 Secondary hypertension, unspecified: Secondary | ICD-10-CM

## 2019-04-18 MED ORDER — METOPROLOL SUCCINATE ER 100 MG PO TB24: 100.0000 mg | ORAL_TABLET | Freq: Every day | ORAL | 11 refills | Status: DC

## 2019-04-21 ENCOUNTER — Ambulatory Visit: Payer: Self-pay | Admitting: Orthopedic Surgery

## 2019-04-21 ENCOUNTER — Other Ambulatory Visit: Payer: Self-pay | Admitting: Infectious Disease

## 2019-04-21 DIAGNOSIS — B20 Human immunodeficiency virus [HIV] disease: Secondary | ICD-10-CM

## 2019-04-23 ENCOUNTER — Other Ambulatory Visit: Payer: Medicare Other

## 2019-04-24 ENCOUNTER — Other Ambulatory Visit (HOSPITAL_COMMUNITY): Payer: Self-pay

## 2019-04-24 NOTE — Telephone Encounter (Signed)
Faxed request for eplerenone, denied as patient needs f/u appt.  Did not return call to office day of virtual office visit

## 2019-05-07 ENCOUNTER — Encounter: Payer: Medicare Other | Admitting: Infectious Disease

## 2019-05-21 ENCOUNTER — Other Ambulatory Visit: Payer: Self-pay

## 2019-05-21 ENCOUNTER — Encounter (HOSPITAL_COMMUNITY): Payer: Self-pay | Admitting: *Deleted

## 2019-05-21 ENCOUNTER — Emergency Department (HOSPITAL_COMMUNITY)
Admission: EM | Admit: 2019-05-21 | Discharge: 2019-05-21 | Disposition: A | Discharge status: Home / Self Care | Payer: Medicare Other | Attending: Emergency Medicine | Admitting: Emergency Medicine

## 2019-05-21 DIAGNOSIS — Z79899 Other long term (current) drug therapy: Secondary | ICD-10-CM | POA: Insufficient documentation

## 2019-05-21 DIAGNOSIS — I509 Heart failure, unspecified: Secondary | ICD-10-CM | POA: Insufficient documentation

## 2019-05-21 DIAGNOSIS — I110 Hypertensive heart disease with heart failure: Secondary | ICD-10-CM | POA: Diagnosis not present

## 2019-05-21 DIAGNOSIS — J449 Chronic obstructive pulmonary disease, unspecified: Secondary | ICD-10-CM | POA: Insufficient documentation

## 2019-05-21 DIAGNOSIS — Z21 Asymptomatic human immunodeficiency virus [HIV] infection status: Secondary | ICD-10-CM | POA: Diagnosis not present

## 2019-05-21 DIAGNOSIS — R369 Urethral discharge, unspecified: Secondary | ICD-10-CM | POA: Insufficient documentation

## 2019-05-21 DIAGNOSIS — Z711 Person with feared health complaint in whom no diagnosis is made: Secondary | ICD-10-CM

## 2019-05-21 DIAGNOSIS — F17210 Nicotine dependence, cigarettes, uncomplicated: Secondary | ICD-10-CM | POA: Insufficient documentation

## 2019-05-21 LAB — URINALYSIS, ROUTINE W REFLEX MICROSCOPIC
Bilirubin Urine: NEGATIVE
Glucose, UA: NEGATIVE mg/dL
Hgb urine dipstick: NEGATIVE
Ketones, ur: NEGATIVE mg/dL
Leukocytes,Ua: NEGATIVE
Nitrite: NEGATIVE
Protein, ur: NEGATIVE mg/dL
Specific Gravity, Urine: 1.014 (ref 1.005–1.030)
pH: 5 (ref 5.0–8.0)

## 2019-05-21 MED ORDER — AZITHROMYCIN 1 G PO PACK
1.0000 g | PACK | Freq: Once | ORAL | Status: AC
  Administered 2019-05-21: 1 g via ORAL
  Filled 2019-05-21: qty 1

## 2019-05-21 MED ORDER — CEFTRIAXONE SODIUM 250 MG IJ SOLR
250.0000 mg | Freq: Once | INTRAMUSCULAR | Status: AC
  Administered 2019-05-21: 250 mg via INTRAMUSCULAR
  Filled 2019-05-21: qty 250

## 2019-05-21 MED ORDER — ONDANSETRON 4 MG PO TBDP
4.0000 mg | ORAL_TABLET | Freq: Once | ORAL | Status: AC
  Administered 2019-05-21: 4 mg via ORAL
  Filled 2019-05-21: qty 1

## 2019-05-21 MED ORDER — METRONIDAZOLE 500 MG PO TABS
2000.0000 mg | ORAL_TABLET | Freq: Once | ORAL | Status: AC
  Administered 2019-05-21: 2000 mg via ORAL
  Filled 2019-05-21: qty 4

## 2019-05-21 MED ORDER — STERILE WATER FOR INJECTION IJ SOLN
INTRAMUSCULAR | Status: AC
  Administered 2019-05-21: 1.2 mL
  Filled 2019-05-21: qty 10

## 2019-05-21 NOTE — Discharge Instructions (Addendum)
No sex until your urine is retested in 2 weeks by the health department or primary care.

## 2019-05-21 NOTE — ED Provider Notes (Signed)
[facility] EMERGENCY DEPARTMENT Provider Note   CSN: [id number] Arrival date & time: 05/21/19  7846    History   Chief Complaint Chief Complaint  Patient presents with  . Penile Discharge    HPI Ryan Long is a 58 y.o. male.     Is a 59 year old male with past medical history of COPD, HIV, alcohol abuse who presents to the emergency department for penile discharge.  Patient reports that about 1 month ago he moved into a house with a friend.  They both had intercourse with the same woman.  He reports his friend told him that he got an STD from the woman.  Patient reports that for the last 2 weeks he has had a clear and gray penile discharge with burning with urination.  Denies testicular pain or swelling.  Denies abdominal pain, nausea, vomiting, fever, back pain.     Past Medical History:  Diagnosis Date  . Active smoker   . AICD (automatic cardioverter/defibrillator) present   . Alcohol abuse   . Anxiety   . AR (allergic rhinitis)   . Bipolar 1 disorder (HCC)   . CHF (congestive heart failure) (HCC)   . Chronic bronchitis (HCC)   . Chronic systolic heart failure (HCC)   . COPD (chronic obstructive pulmonary disease) (HCC)   . Cough 12/31/2018  . Crack cocaine use   . Depression   . Genital herpes   . HIV (human immunodeficiency virus infection) (HCC) dx'd 08/1993  . HLD (hyperlipidemia)   . Hypertension   . NICM (nonischemic cardiomyopathy) (HCC)    Echocardiogram 06/28/11: EF 30-35%, mild MR, mild LAE;  No CAD by coronary CT angiogram 3/12 at [facility]  . NSVT (nonsustained ventricular tachycardia) (HCC)   . PTSD (post-traumatic stress disorder)     Patient Active Problem List   Diagnosis Date Noted  . Cough 12/31/2018  . Controlled substance agreement signed 10/22/2018  . AICD (automatic cardioverter/defibrillator) present 08/15/2018  . Abnormal EKG 06/28/2018  . Cocaine abuse (HCC) 06/25/2018  . Encounter for  other general examination 06/25/2018  . Current severe episode of major depressive disorder with psychotic features without prior episode (HCC) 06/24/2018  . MDD (major depressive disorder), recurrent episode, moderate (HCC) 06/06/2018  . Current moderate episode of major depressive disorder (HCC) 05/24/2018  . Adjustment disorder with mixed disturbance of emotions and conduct   . NICM (nonischemic cardiomyopathy) (HCC) 01/08/2018  . Suicidal ideation 12/25/2017  . Anxiety 12/20/2017  . Bipolar 1 disorder (HCC) 10/10/2017  . Breast lump 10/03/2017  . Chronic systolic heart failure (HCC) 05/13/2017  . Genital herpes 05/13/2017  . HSV-2 infection 07/17/2016  . 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 Nonpsychotic mental disorder 06/22/2016  . Screening examination for venereal disease 04/18/2016  . Encounter for long-term (current) use of medications 04/18/2016  . History of attempted suicide 04/14/2016  . History of alcoholism (HCC) 04/14/2016  . History of substance abuse (HCC) 04/14/2016  . Constipation 01/17/2016  . Homelessness 01/11/2016  . Major depressive disorder, recurrent, unspecified (HCC) 10/08/2015  . Chronic obstructive pulmonary disease (HCC) 10/04/2015  . Abnormal thyroid stimulating hormone (TSH) level 10/04/2015  . MDD (major depressive disorder), recurrent episode, severe (HCC) 09/23/2015  . Hypertension 09/10/2015  . MDD (major depressive disorder), recurrent, severe, with psychosis (HCC) 08/07/2015  . PTSD (post-traumatic stress disorder) 08/07/2015  . Cocaine use disorder, severe, dependence (HCC) 08/07/2015  . Major depressive disorder, recurrent severe without  psychotic features (HCC) 04/26/2015  . Primary insomnia 04/02/2015  . Herpesviral infection of penis 04/02/2015  . Stimulant use disorder 05/29/2014  . Generalized anxiety disorder 05/29/2014  . Nondependent cannabis abuse, episodic  02/16/2014  . Cannabis abuse, uncomplicated 02/16/2014  . Polysubstance (excluding opioids) dependence with physiological dependence (HCC) 12/30/2013  . Substance induced mood disorder (HCC) 11/22/2013  . Tobacco use disorder 02/17/2013  . Cocaine abuse, episodic use (HCC) 03/20/2012    Class: Acute  . Alcohol dependence (HCC) 03/20/2012    Class: Acute  . Alcohol use disorder, severe, dependence (HCC) 03/20/2012  . Heart failure (HCC) 09/19/2011  . Neutropenia (HCC) 09/19/2011  . Acute congestive heart failure (HCC) 09/19/2011  . Alcohol use with alcohol-induced disorder (HCC) 09/19/2011  . Major depressive disorder, single episode, unspecified 09/17/2011  . Other primary cardiomyopathies 08/24/2011  . Human immunodeficiency virus (HIV) disease (HCC) 02/01/2009  . Recurrent HSV (herpes simplex virus) 02/01/2009  . Hyperlipidemia 02/01/2009  . Allergic rhinitis 02/01/2009    Past Surgical History:  Procedure Laterality Date  . CARDIAC DEFIBRILLATOR PLACEMENT  01/08/2018  . ICD IMPLANT N/A 01/08/2018   Procedure: ICD IMPLANT;  Surgeon: Regan Lemmingamnitz, Will Martin, MD;  [facility];  Service: Cardiovascular;  Laterality: N/A;        Home Medications    Prior to Admission medications   Medication Sig Start Date End Date Taking? Authorizing Provider  aspirin 81 MG chewable tablet Chew 1 tablet (81 mg total) by mouth every morning. For heart health 03/26/18   Tonye Becketlegg, Amy D, NP  bictegravir-emtricitabine-tenofovir AF (BIKTARVY) 50-200-25 MG TABS tablet Take 1 tablet by mouth daily. 03/26/18   Randall HissVan Dam, Cornelius N, MD  diclofenac sodium (VOLTAREN) 1 % GEL Apply 4 g topically 4 (four) times daily. To area of pain 03/17/19   Fulp, Cammie, MD  digoxin (LANOXIN) 0.125 MG tablet TAKE 1 TABLET BY MOUTH EVERY DAY FOR HEART ARRHYTHMIA; follow-up with cardiology for refills 04/08/19   Bensimhon, Bevelyn Bucklesaniel R, MD  eplerenone (INSPRA) 25 MG tablet TAKE 1/2 TABLET(12.5 MG) BY MOUTH DAILY 02/10/19    Bensimhon, Bevelyn Bucklesaniel R, MD  furosemide (LASIX) 40 MG tablet Take 1 tablet (40 mg total) by mouth daily. 01/07/18   Bensimhon, Bevelyn Bucklesaniel R, MD  hydrOXYzine (ATARAX/VISTARIL) 25 MG tablet Take 1-2 tablets (25-50 mg total) by mouth 2 (two) times daily as needed for anxiety. 03/26/18   Clegg, Amy D, NP  losartan (COZAAR) 50 MG tablet TAKE 1 TABLET(50 MG) BY MOUTH DAILY 01/07/18   Bensimhon, Bevelyn Bucklesaniel R, MD  metoprolol succinate (TOPROL-XL) 100 MG 24 hr tablet Take 1 tablet (100 mg total) by mouth at bedtime. For high blood pressure 04/18/19   Daiva EvesVan Dam, Lisette Grinderornelius N, MD  Multiple Vitamins-Minerals (MULTIVITAMIN WITH MINERALS) tablet Take 1 tablet by mouth daily.    [provider]  potassium chloride SA (K-DUR,KLOR-CON) 20 MEQ tablet Take 1 tablet (20 mEq total) by mouth daily. 01/07/18   Bensimhon, Bevelyn Bucklesaniel R, MD  sildenafil (VIAGRA) 50 MG tablet Take 1 tablet (50 mg total) by mouth daily as needed for erectile dysfunction. At least 24 hours between doses 01/21/18   Hoy RegisterNewlin, Enobong, MD  valACYclovir (VALTREX) 500 MG tablet Take 1 tablet (500 mg total) by mouth daily. For Herpes. 03/17/19   Fulp, Cammie, MD  VENTOLIN HFA 108 (90 Base) MCG/ACT inhaler INHALE 2 PUFFS INTO THE LUNGS EVERY 6 HOURS AS NEEDED FOR WHEEZING OR SHORTNESS OF BREATH 03/17/19   Cain SaupeFulp, Cammie, MD    Family History  Family History  Problem Relation Age of Onset  . Glaucoma Mother   . Mental illness Mother   . Vision loss Mother   . Hypertension Father   . CAD Father   . Mental illness Father   . Alcohol abuse Father   . Drug abuse Father   . Heart attack Father 67  . Early death Father   . Alcohol abuse Brother   . Drug abuse Brother   . Drug abuse Brother     Social History Social History   Tobacco Use  . Smoking status: Current Every Day Smoker    Packs/day: 0.50    Years: 38.00    Pack years: 19.00    Types: Cigarettes  . Smokeless tobacco: Never Used  Substance Use Topics  . Alcohol use: Yes    Alcohol/week: 6.0 standard  drinks    Types: 6 Cans of beer per week    Comment: 01/08/2018 "stopped 06/2017"  . Drug use: Yes    Types: Cocaine    Comment: 01/08/2018 "stopped 06/2017"     Allergies   Patient has no known allergies.   Review of Systems Review of Systems  Gastrointestinal: Negative for abdominal pain, nausea and vomiting.  Genitourinary: Positive for discharge, dysuria and penile pain. Negative for decreased urine volume, difficulty urinating, enuresis, flank pain, hematuria, penile swelling, scrotal swelling, testicular pain and urgency.  All other systems reviewed and are negative.    Physical Exam Updated Vital Signs BP 118/75 (BP Location: Right Arm)   Pulse (!) 119   Temp 98.2 F (36.8 C) (Oral)   Resp 12   SpO2 97%   Physical Exam Vitals signs and nursing note reviewed.  Constitutional:      Appearance: Normal appearance.  HENT:     Head: Normocephalic.  Eyes:     Conjunctiva/sclera: Conjunctivae normal.  Pulmonary:     Effort: Pulmonary effort is normal.  Abdominal:     General: Abdomen is flat.  Skin:    General: Skin is dry.  Neurological:     Mental Status: He is alert.  Psychiatric:        Mood and Affect: Mood normal.      ED Treatments / Results  Labs (all labs ordered are listed, but only abnormal results are displayed) Labs Reviewed  URINALYSIS, ROUTINE W REFLEX MICROSCOPIC  GC/CHLAMYDIA PROBE AMP ([place]) NOT AT [facility]    EKG None  Radiology No results found.  Procedures Procedures (including critical care time)  Medications Ordered in ED Medications  azithromycin (ZITHROMAX) powder 1 g (has no administration in time range)  cefTRIAXone (ROCEPHIN) injection 250 mg (has no administration in time range)  metroNIDAZOLE (FLAGYL) tablet 2,000 mg (has no administration in time range)  ondansetron (ZOFRAN-ODT) disintegrating tablet 4 mg (has no administration in time range)     Initial Impression / Assessment and Plan / ED Course  I have  reviewed the triage vital signs and the nursing notes.  Pertinent labs & imaging results that were available during my care of the patient were reviewed by me and considered in my medical decision making (see chart for details).  Clinical Course as of May 20 832  Wed May 21, 2019  0832 I am going to treat patient for gonorrhea, chlamydia and trichomonas.  Patient given 1 g of Zithromax, 250 IM Rocephin and 2000 mg of metronidazole with Zofran.  Advised on practicing safe sex.  Advised no sexual intercourse for at least 2 weeks and until  he has his urine rechecked at health department or his primary care doctor to be sure everything comes back normal.   [KM]    Clinical Course User Index [KM] Arlyn Dunning, PA-C         Final Clinical Impressions(s) / ED Diagnoses   Final diagnoses:  Penile discharge  Concern about STD in male without diagnosis    ED Discharge Orders    None       Jeral Pinch 05/21/19 1751    Terrilee Files, MD 05/21/19 [phone]

## 2019-05-21 NOTE — ED Triage Notes (Signed)
To ED for eval of penile discharge for past few weeks. Told he was exposed to STD.

## 2019-05-23 LAB — GC/CHLAMYDIA PROBE AMP (~~LOC~~) NOT AT ARMC
Chlamydia: NEGATIVE
Neisseria Gonorrhea: NEGATIVE

## 2019-06-19 ENCOUNTER — Other Ambulatory Visit (HOSPITAL_COMMUNITY): Payer: Self-pay | Admitting: Internal Medicine

## 2019-06-19 DIAGNOSIS — I5022 Chronic systolic (congestive) heart failure: Secondary | ICD-10-CM

## 2019-06-21 ENCOUNTER — Other Ambulatory Visit (HOSPITAL_COMMUNITY): Payer: Self-pay | Admitting: Internal Medicine

## 2019-06-21 DIAGNOSIS — I5022 Chronic systolic (congestive) heart failure: Secondary | ICD-10-CM

## 2019-06-25 ENCOUNTER — Other Ambulatory Visit (HOSPITAL_COMMUNITY): Payer: Self-pay | Admitting: Internal Medicine

## 2019-06-25 DIAGNOSIS — I5022 Chronic systolic (congestive) heart failure: Secondary | ICD-10-CM

## 2019-07-03 ENCOUNTER — Telehealth: Payer: Medicare Other | Admitting: Family Medicine

## 2019-07-07 ENCOUNTER — Ambulatory Visit: Payer: Medicare Other | Admitting: Orthopedic Surgery

## 2019-07-09 ENCOUNTER — Encounter (HOSPITAL_COMMUNITY): Payer: Medicare Other

## 2019-07-14 ENCOUNTER — Telehealth: Payer: Self-pay | Admitting: Cardiology

## 2019-07-14 NOTE — Telephone Encounter (Signed)
New Message     No number to call to confirm appt and have covid questions answered

## 2019-07-15 ENCOUNTER — Encounter: Payer: Medicare Other | Admitting: Cardiology

## 2019-08-29 DIAGNOSIS — F21 Schizotypal disorder: Secondary | ICD-10-CM | POA: Insufficient documentation

## 2019-09-29 ENCOUNTER — Other Ambulatory Visit: Payer: Self-pay | Admitting: Family Medicine

## 2019-09-29 DIAGNOSIS — B009 Herpesviral infection, unspecified: Secondary | ICD-10-CM

## 2019-10-27 ENCOUNTER — Encounter (HOSPITAL_COMMUNITY): Payer: Medicare Other

## 2019-10-27 ENCOUNTER — Other Ambulatory Visit: Payer: Medicare Other

## 2019-10-30 ENCOUNTER — Telehealth: Payer: Self-pay | Admitting: Cardiology

## 2019-10-30 ENCOUNTER — Ambulatory Visit (INDEPENDENT_AMBULATORY_CARE_PROVIDER_SITE_OTHER): Payer: Medicare Other | Admitting: *Deleted

## 2019-10-30 DIAGNOSIS — I428 Other cardiomyopathies: Secondary | ICD-10-CM | POA: Diagnosis not present

## 2019-10-30 DIAGNOSIS — I5022 Chronic systolic (congestive) heart failure: Secondary | ICD-10-CM

## 2019-10-30 LAB — CUP PACEART REMOTE DEVICE CHECK
Battery Remaining Longevity: 80 mo
Battery Remaining Percentage: 80 %
Battery Voltage: 2.98 V
Brady Statistic RV Percent Paced: 1 %
Date Time Interrogation Session: 20201112191535
HighPow Impedance: 84 Ohm
HighPow Impedance: 86 Ohm
Implantable Lead Implant Date: 20190122
Implantable Lead Location: 753860
Implantable Pulse Generator Implant Date: 20190122
Lead Channel Impedance Value: 480 Ohm
Lead Channel Pacing Threshold Amplitude: 0.75 V
Lead Channel Pacing Threshold Pulse Width: 0.5 ms
Lead Channel Sensing Intrinsic Amplitude: 9.2 mV
Lead Channel Setting Pacing Amplitude: 2 V
Lead Channel Setting Pacing Pulse Width: 0.5 ms
Lead Channel Setting Sensing Sensitivity: 0.5 mV
Pulse Gen Serial Number: 7376107

## 2019-10-30 NOTE — Telephone Encounter (Signed)
New Message    Jenny Reichmann is calling from [facility] and would like to speak to someone in the device clinic     Please call back

## 2019-10-30 NOTE — Telephone Encounter (Signed)
Spoke with Jenny Reichmann from [place], he reports patient is wanting to now be followed there. I transferred him in [place] and made inactive in [place]. I advised Jenny Reichmann that he had called and made an appt with Dr Curt Bears for later this month. If he shows up, will need to ask him where he wants to be followed long term.  Chanetta Marshall, NP 10/30/2019 5:07 PM

## 2019-11-11 ENCOUNTER — Encounter: Payer: Medicare Other | Admitting: Cardiology

## 2019-11-11 NOTE — Progress Notes (Deleted)
Electrophysiology Office Note   Date:  11/11/2019   ID:  Ryan Long, DOB 29-Sep-1961, MRN [id number]  PCP:  Cain Saupe, MD  Cardiologist:  Bensimhon Primary Electrophysiologist:  Emir Nack [name], MD    No chief complaint on file.    History of Present Illness: Ryan Long is a 58 y.o. male who is being seen today for the evaluation of CHF at the request of Ryan Long. Presenting today for electrophysiology evaluation. He has a history of tobacco abuse, alcohol abuse, bipolar 1, nonischemic cardiomyopathy, COPD, HIV, hyperlipidemia, hypertension.  His nonischemic cardiomyopathy was not felt due to HIV due to good CD4 counts.  He is no longer using cocaine.  He is try to cut back on smoking.  He is currently living in a shelter in [place].   Today, denies symptoms of palpitations, chest pain, shortness of breath, orthopnea, PND, lower extremity edema, claudication, dizziness, presyncope, syncope, bleeding, or neurologic sequela. The patient is tolerating medications without difficulties. ***   Past Medical History:  Diagnosis Date  . Active smoker   . AICD (automatic cardioverter/defibrillator) present   . Alcohol abuse   . Anxiety   . AR (allergic rhinitis)   . Bipolar 1 disorder (HCC)   . CHF (congestive heart failure) (HCC)   . Chronic bronchitis (HCC)   . Chronic systolic heart failure (HCC)   . COPD (chronic obstructive pulmonary disease) (HCC)   . Cough 12/31/2018  . Crack cocaine use   . Depression   . Genital herpes   . HIV (human immunodeficiency virus infection) (HCC) dx'd 08/1993  . HLD (hyperlipidemia)   . Hypertension   . NICM (nonischemic cardiomyopathy) (HCC)    Echocardiogram 06/28/11: EF 30-35%, mild MR, mild LAE;  No CAD by coronary CT angiogram 3/12 at [facility]  . NSVT (nonsustained ventricular tachycardia) (HCC)   . PTSD (post-traumatic stress disorder)    Past Surgical History:  Procedure Laterality  Date  . CARDIAC DEFIBRILLATOR PLACEMENT  01/08/2018  . ICD IMPLANT N/A 01/08/2018   Procedure: ICD IMPLANT;  Surgeon: Regan Lemming, MD;  Location: [facility] INVASIVE CV LAB;  Service: Cardiovascular;  Laterality: N/A;     Current Outpatient Medications  Medication Sig Dispense Refill  . aspirin 81 MG chewable tablet Chew 1 tablet (81 mg total) by mouth every morning. For heart health 30 tablet 11  . bictegravir-emtricitabine-tenofovir AF (BIKTARVY) 50-200-25 MG TABS tablet Take 1 tablet by mouth daily. 30 tablet 11  . diclofenac sodium (VOLTAREN) 1 % GEL Apply 4 g topically 4 (four) times daily. To area of pain 1 Tube 3  . digoxin (LANOXIN) 0.125 MG tablet TAKE 1 TABLET BY MOUTH EVERY DAY FOR HEART ARRHYTHMIA, PLEASE SCHEDULE AN APPOINTMENT FOR FURTHER REFILLS 30 tablet 0  . eplerenone (INSPRA) 25 MG tablet TAKE 1/2 TABLET(12.5 MG) BY MOUTH DAILY 45 tablet 0  . furosemide (LASIX) 40 MG tablet Take 1 tablet (40 mg total) by mouth daily. 90 tablet 1  . hydrOXYzine (ATARAX/VISTARIL) 25 MG tablet Take 1-2 tablets (25-50 mg total) by mouth 2 (two) times daily as needed for anxiety. 60 tablet 11  . losartan (COZAAR) 50 MG tablet TAKE 1 TABLET(50 MG) BY MOUTH DAILY 90 tablet 1  . metoprolol succinate (TOPROL-XL) 100 MG 24 hr tablet Take 1 tablet (100 mg total) by mouth at bedtime. For high blood pressure 30 tablet 11  . Multiple Vitamins-Minerals (MULTIVITAMIN WITH MINERALS) tablet Take 1 tablet by mouth daily.    Marland Kitchen  potassium chloride SA (K-DUR,KLOR-CON) 20 MEQ tablet Take 1 tablet (20 mEq total) by mouth daily. 90 tablet 1  . sildenafil (VIAGRA) 50 MG tablet Take 1 tablet (50 mg total) by mouth daily as needed for erectile dysfunction. At least 24 hours between doses 20 tablet 0  . valACYclovir (VALTREX) 500 MG tablet Take 1 tablet (500 mg total) by mouth daily. For Herpes. 30 tablet 1  . VENTOLIN HFA 108 (90 Base) MCG/ACT inhaler INHALE 2 PUFFS INTO THE LUNGS EVERY 6 HOURS AS NEEDED FOR WHEEZING OR  SHORTNESS OF BREATH 18 g 3   No current facility-administered medications for this visit.     Allergies:   Patient has no known allergies.   Social History:  The patient  reports that he has been smoking cigarettes. He has a 19.00 pack-year smoking history. He has never used smokeless tobacco. He reports current alcohol use of about 6.0 standard drinks of alcohol per week. He reports current drug use. Drug: Cocaine.   Family History:  The patient's family history includes Alcohol abuse in his brother and father; CAD in his father; Drug abuse in his brother, brother, and father; Early death in his father; Glaucoma in his mother; Heart attack (age of onset: 3) in his father; Hypertension in his father; Mental illness in his father and mother; Vision loss in his mother.   ROS:  Please see the history of present illness.   Otherwise, review of systems is positive for ***.   All other systems are reviewed and negative.   PHYSICAL EXAM: VS:  There were no vitals taken for this visit. , BMI There is no height or weight on file to calculate BMI. GEN: Well nourished, well developed, in no acute distress  HEENT: normal  Neck: no JVD, carotid bruits, or masses Cardiac: ***RRR; no murmurs, rubs, or gallops,no edema  Respiratory:  clear to auscultation bilaterally, normal work of breathing GI: soft, nontender, nondistended, + BS MS: no deformity or atrophy  Skin: warm and dry, device site well healed Neuro:  Strength and sensation are intact Psych: euthymic mood, full affect  EKG:  EKG {ACTION; IS/IS [id number]} ordered today. Personal review of the ekg ordered *** shows ***  Personal review of the device interrogation today. Results in [facility]   Recent Labs: 12/31/2018: ALT 23; B Natriuretic Peptide 40.9 01/04/2019: BUN 14; Creatinine, Ser 1.25; Hemoglobin 16.0; Platelets 163; Potassium 4.0; Sodium 139    Lipid Panel     Component Value Date/Time   CHOL 150 04/18/2016 1622   TRIG 193 (H)  04/18/2016 1622   HDL 61 04/18/2016 1622   CHOLHDL 2.5 04/18/2016 1622   VLDL 39 (H) 04/18/2016 1622   LDLCALC 50 04/18/2016 1622     Wt Readings from Last 3 Encounters:  03/24/19 211 lb (95.7 kg)  12/31/18 211 lb (95.7 kg)  03/19/18 200 lb (90.7 kg)      Other studies Reviewed: Additional studies/ records that were reviewed today include: TTE 10/10/17  Review of the above records today demonstrates:  - Left ventricle: The cavity size was normal. Wall thickness was   normal. Systolic function was severely reduced. The estimated   ejection fraction was in the range of 25% to 30%. Diffuse   hypokinesis. Doppler parameters are consistent with abnormal left   ventricular relaxation (grade 1 diastolic dysfunction).   ASSESSMENT AND PLAN:  1.  Chronic systolic heart failure due to nonischemic cardiomyopathy: Status post [facility] Jude dual-chamber pacemaker.  Device functioning appropriately.  No changes.  Patient currently on optimal medical therapy.***  2.  HIV: Follow-up with infectious disease.  No changes.  3.  Tobacco abuse: Complete cessation encouraged  4.  Cocaine abuse: Congratulated on continued abstinence  Current medicines are reviewed at length with the patient today.   The patient does not have concerns regarding his medicines.  The following changes were made today:  none  Labs/ tests ordered today include:  No orders of the defined types were placed in this encounter.    Disposition:   FU with Dianna Deshler *** months  Signed, Kyros Salzwedel Meredith Leeds, MD  11/11/2019 1:44 PM     [address] [place] [address] [phone] (office) [phone] (fax)

## 2019-11-12 ENCOUNTER — Encounter: Payer: Medicare Other | Admitting: Infectious Disease

## 2019-11-21 NOTE — Progress Notes (Signed)
Remote ICD transmission.   

## 2020-01-28 DIAGNOSIS — I5020 Unspecified systolic (congestive) heart failure: Secondary | ICD-10-CM | POA: Insufficient documentation

## 2020-01-28 DIAGNOSIS — N400 Enlarged prostate without lower urinary tract symptoms: Secondary | ICD-10-CM | POA: Insufficient documentation

## 2020-02-03 ENCOUNTER — Telehealth: Payer: Self-pay | Admitting: *Deleted

## 2020-02-03 NOTE — Telephone Encounter (Signed)
Called patient regarding his appt tomorrow Feb. 17 at 8:50.Patient upset because he been scheduled as a tele visit. Explained to patient b/c of the sx's he relayed: Productive cough of green and yellow expectorant. Patient adamant he does not have a cough but some how he expels colorful mucous/ phlegm.   Explained to patient about the respiratory clinic being a means to seek medical care as well. Patent stated that he may as well go to the ED.   Patient not in acute distress, no SOB. Spoke clearly. Even states to me that he has not had a cough while on the phone with me but has concerns that he may have bronchitis.

## 2020-02-04 ENCOUNTER — Ambulatory Visit: Payer: Medicare Other | Attending: Family Medicine | Admitting: Physician Assistant

## 2020-02-04 NOTE — Progress Notes (Signed)
This encounter was created in error - please disregard.

## 2020-02-13 NOTE — Addendum Note (Signed)
Addended by: Michelina Mexicano D on: 02/13/2020 11:08 AM   Modules accepted: Orders  

## 2020-02-17 ENCOUNTER — Other Ambulatory Visit: Payer: Medicare Other

## 2020-03-08 ENCOUNTER — Ambulatory Visit (INDEPENDENT_AMBULATORY_CARE_PROVIDER_SITE_OTHER): Payer: Medicare Other | Admitting: Infectious Disease

## 2020-03-08 ENCOUNTER — Other Ambulatory Visit: Payer: Self-pay

## 2020-03-08 ENCOUNTER — Encounter: Payer: Self-pay | Admitting: Infectious Disease

## 2020-03-08 VITALS — BP 96/63 | HR 57 | Temp 97.7°F | Ht 68.0 in | Wt 205.0 lb

## 2020-03-08 DIAGNOSIS — Z79899 Other long term (current) drug therapy: Secondary | ICD-10-CM | POA: Diagnosis not present

## 2020-03-08 DIAGNOSIS — F17200 Nicotine dependence, unspecified, uncomplicated: Secondary | ICD-10-CM

## 2020-03-08 DIAGNOSIS — F333 Major depressive disorder, recurrent, severe with psychotic symptoms: Secondary | ICD-10-CM | POA: Diagnosis not present

## 2020-03-08 DIAGNOSIS — F4310 Post-traumatic stress disorder, unspecified: Secondary | ICD-10-CM

## 2020-03-08 DIAGNOSIS — I428 Other cardiomyopathies: Secondary | ICD-10-CM | POA: Diagnosis not present

## 2020-03-08 DIAGNOSIS — F1021 Alcohol dependence, in remission: Secondary | ICD-10-CM | POA: Diagnosis not present

## 2020-03-08 DIAGNOSIS — B20 Human immunodeficiency virus [HIV] disease: Secondary | ICD-10-CM | POA: Diagnosis present

## 2020-03-08 DIAGNOSIS — Z95810 Presence of automatic (implantable) cardiac defibrillator: Secondary | ICD-10-CM

## 2020-03-08 MED ORDER — BIKTARVY 50-200-25 MG PO TABS: 1.0000 | ORAL_TABLET | Freq: Every day | ORAL | 11 refills | Status: DC

## 2020-03-08 NOTE — Patient Instructions (Addendum)
IF you want another PCP I would recommend   Hillard Danker, MD  [facility] [address] [place], [place] [address] [phone] Locate on Map

## 2020-03-08 NOTE — Progress Notes (Signed)
Subjective:    Patient ID: Ryan Long, male    DOB: Feb 01, 1961, 59 y.o.   MRN: [id number]  HPI  59 year old man with HIV, host of other medical problems including nonischemic cardiomyopathy, ICD placement prior history of polydrug substance abuse that is been clean recently still struggling with some depressive symptoms.  Had not seen him for roughly a year and he is certainly overdue for labs is been getting Radio producer through [place] on [address].  He says he has been highly adherent to this.  He just got his [employer] COVID-19 vaccine on Friday and still feeling a bit fatigued.  He has had them for depressive symptoms but denies suicidal ideation or homicidal ideation.  He has been prescribed Wellbutrin but has not seen primary care for some time now.  I think he also clearly needs counseling.      Past Medical History:  Diagnosis Date  . Active smoker   . AICD (automatic cardioverter/defibrillator) present   . Alcohol abuse   . Anxiety   . AR (allergic rhinitis)   . Bipolar 1 disorder (HCC)   . CHF (congestive heart failure) (HCC)   . Chronic bronchitis (HCC)   . Chronic systolic heart failure (HCC)   . COPD (chronic obstructive pulmonary disease) (HCC)   . Cough 12/31/2018  . Crack cocaine use   . Depression   . Genital herpes   . HIV (human immunodeficiency virus infection) (HCC) dx'd 08/1993  . HLD (hyperlipidemia)   . Hypertension   . NICM (nonischemic cardiomyopathy) (HCC)    Echocardiogram 06/28/11: EF 30-35%, mild MR, mild LAE;  No CAD by coronary CT angiogram 3/12 at [facility]  . NSVT (nonsustained ventricular tachycardia) (HCC)   . PTSD (post-traumatic stress disorder)     Past Surgical History:  Procedure Laterality Date  . CARDIAC DEFIBRILLATOR PLACEMENT  01/08/2018  . ICD IMPLANT N/A 01/08/2018   Procedure: ICD IMPLANT;  Surgeon: Regan Lemming, MD;  Location: [facility] INVASIVE CV LAB;  Service: Cardiovascular;   Laterality: N/A;    Family History  Problem Relation Age of Onset  . Glaucoma Mother   . Mental illness Mother   . Vision loss Mother   . Hypertension Father   . CAD Father   . Mental illness Father   . Alcohol abuse Father   . Drug abuse Father   . Heart attack Father 42  . Early death Father   . Alcohol abuse Brother   . Drug abuse Brother   . Drug abuse Brother       Social History   Socioeconomic History  . Marital status: Single    Spouse name: Not on file  . Number of children: Not on file  . Years of education: Not on file  . Highest education level: Not on file  Occupational History  . Not on file  Tobacco Use  . Smoking status: Current Every Day Smoker    Packs/day: 0.50    Years: 38.00    Pack years: 19.00    Types: Cigarettes  . Smokeless tobacco: Never Used  Substance and Sexual Activity  . Alcohol use: Yes    Alcohol/week: 6.0 standard drinks    Types: 6 Cans of beer per week    Comment: 01/08/2018 "stopped 06/2017"  . Drug use: Yes    Types: Cocaine    Comment: 01/08/2018 "stopped 06/2017"  . Sexual activity: Yes    Birth control/protection: Condom  Comment: pt. given condoms  Other Topics Concern  . Not on file  Social History Narrative  . Not on file   Social Determinants of Health   Financial Resource Strain:   . Difficulty of Paying Living Expenses:   Food Insecurity:   . Worried About Charity fundraiser in the Last Year:   . Arboriculturist in the Last Year:   Transportation Needs:   . Film/video editor (Medical):   Marland Kitchen Lack of Transportation (Non-Medical):   Physical Activity:   . Days of Exercise per Week:   . Minutes of Exercise per Session:   Stress:   . Feeling of Stress :   Social Connections:   . Frequency of Communication with Friends and Family:   . Frequency of Social Gatherings with Friends and Family:   . Attends Religious Services:   . Active Member of Clubs or Organizations:   . Attends Archivist  Meetings:   Marland Kitchen Marital Status:     No Known Allergies   Current Outpatient Medications:  .  aspirin 81 MG chewable tablet, Chew 1 tablet (81 mg total) by mouth every morning. For heart health, Disp: 30 tablet, Rfl: 11 .  bictegravir-emtricitabine-tenofovir AF (BIKTARVY) 50-200-25 MG TABS tablet, Take 1 tablet by mouth daily., Disp: 30 tablet, Rfl: 11 .  diclofenac sodium (VOLTAREN) 1 % GEL, Apply 4 g topically 4 (four) times daily. To area of pain, Disp: 1 Tube, Rfl: 3 .  digoxin (LANOXIN) 0.125 MG tablet, TAKE 1 TABLET BY MOUTH EVERY DAY FOR HEART ARRHYTHMIA, PLEASE SCHEDULE AN APPOINTMENT FOR FURTHER REFILLS, Disp: 30 tablet, Rfl: 0 .  eplerenone (INSPRA) 25 MG tablet, TAKE 1/2 TABLET(12.5 MG) BY MOUTH DAILY, Disp: 45 tablet, Rfl: 0 .  furosemide (LASIX) 40 MG tablet, Take 1 tablet (40 mg total) by mouth daily., Disp: 90 tablet, Rfl: 1 .  hydrOXYzine (ATARAX/VISTARIL) 25 MG tablet, Take 1-2 tablets (25-50 mg total) by mouth 2 (two) times daily as needed for anxiety., Disp: 60 tablet, Rfl: 11 .  losartan (COZAAR) 50 MG tablet, TAKE 1 TABLET(50 MG) BY MOUTH DAILY, Disp: 90 tablet, Rfl: 1 .  metoprolol succinate (TOPROL-XL) 100 MG 24 hr tablet, Take 1 tablet (100 mg total) by mouth at bedtime. For high blood pressure, Disp: 30 tablet, Rfl: 11 .  Multiple Vitamins-Minerals (MULTIVITAMIN WITH MINERALS) tablet, Take 1 tablet by mouth daily., Disp: , Rfl:  .  potassium chloride SA (K-DUR,KLOR-CON) 20 MEQ tablet, Take 1 tablet (20 mEq total) by mouth daily., Disp: 90 tablet, Rfl: 1 .  sildenafil (VIAGRA) 50 MG tablet, Take 1 tablet (50 mg total) by mouth daily as needed for erectile dysfunction. At least 24 hours between doses, Disp: 20 tablet, Rfl: 0 .  valACYclovir (VALTREX) 500 MG tablet, Take 1 tablet (500 mg total) by mouth daily. For Herpes., Disp: 30 tablet, Rfl: 1 .  VENTOLIN HFA 108 (90 Base) MCG/ACT inhaler, INHALE 2 PUFFS INTO THE LUNGS EVERY 6 HOURS AS NEEDED FOR WHEEZING OR SHORTNESS OF  BREATH, Disp: 18 g, Rfl: 3   Review of Systems  Constitutional: Positive for fatigue. Negative for activity change, appetite change, chills, diaphoresis, fever and unexpected weight change.  HENT: Negative for congestion, rhinorrhea, sinus pressure, sneezing, sore throat and trouble swallowing.   Eyes: Negative for photophobia and visual disturbance.  Respiratory: Negative for cough, chest tightness, shortness of breath, wheezing and stridor.   Cardiovascular: Negative for chest pain, palpitations and leg swelling.  Gastrointestinal: Negative  for abdominal distention, abdominal pain, anal bleeding, blood in stool, constipation, diarrhea, nausea and vomiting.  Genitourinary: Negative for difficulty urinating, dysuria, flank pain and hematuria.  Musculoskeletal: Negative for arthralgias, back pain, gait problem, joint swelling and myalgias.  Skin: Negative for color change, pallor, rash and wound.  Neurological: Negative for dizziness, tremors, weakness and light-headedness.  Hematological: Negative for adenopathy. Does not bruise/bleed easily.  Psychiatric/Behavioral: Positive for dysphoric mood. Negative for agitation, behavioral problems, confusion, decreased concentration and sleep disturbance.       Objective:   Physical Exam Constitutional:      General: He is not in acute distress.    Appearance: Normal appearance. He is well-developed. He is not ill-appearing or diaphoretic.  HENT:     Head: Normocephalic and atraumatic.     Right Ear: Hearing and external ear normal.     Left Ear: Hearing and external ear normal.     Nose: No nasal deformity or rhinorrhea.  Eyes:     General: No scleral icterus.    Conjunctiva/sclera: Conjunctivae normal.     Right eye: Right conjunctiva is not injected.     Left eye: Left conjunctiva is not injected.     Pupils: Pupils are equal, round, and reactive to light.  Neck:     Vascular: No JVD.  Cardiovascular:     Rate and Rhythm: Normal rate  and regular rhythm.     Heart sounds: Normal heart sounds, S1 normal and S2 normal. No murmur. No friction rub.  Abdominal:     General: Bowel sounds are normal. There is no distension.     Palpations: Abdomen is soft.     Tenderness: There is no abdominal tenderness.  Musculoskeletal:        General: Normal range of motion.     Right shoulder: Normal.     Left shoulder: Normal.     Cervical back: Normal range of motion and neck supple.     Right hip: Normal.     Left hip: Normal.     Right knee: Normal.     Left knee: Normal.  Lymphadenopathy:     Head:     Right side of head: No submandibular, preauricular or posterior auricular adenopathy.     Left side of head: No submandibular, preauricular or posterior auricular adenopathy.     Cervical: No cervical adenopathy.     Right cervical: No superficial or deep cervical adenopathy.    Left cervical: No superficial or deep cervical adenopathy.  Skin:    General: Skin is warm and dry.     Coloration: Skin is not pale.     Findings: No abrasion, bruising, ecchymosis, erythema, lesion or rash.     Nails: There is no clubbing.  Neurological:     Mental Status: He is alert and oriented to person, place, and time.     Sensory: No sensory deficit.     Coordination: Coordination normal.     Gait: Gait normal.  Psychiatric:        Attention and Perception: He is attentive.        Mood and Affect: Mood normal.        Speech: Speech normal.        Behavior: Behavior normal. Behavior is cooperative.        Thought Content: Thought content normal.        Judgment: Judgment normal.           Assessment & Plan:   HIV disease :  Typically been well controlled on Biktarvy we will check labs today have him come back in a year.  Depressive symptoms: Continue to be on Wellbutrin and we will plug him in with our counselor here.  Nonischemic cardiomyopathy and ICD: Follow-up with electrophysiology.  Need for primary care: He wanted to  change providers. I am  going to refer him to Dr. Okey Dupre w [facility]   Smoking still smoking tobacco but is stopped his other recreational drugs.  On Wellbutrin hopefully can achieve smoking cessation

## 2020-03-09 LAB — T-HELPER CELL (CD4) - (RCID CLINIC ONLY)
CD4 % Helper T Cell: 42 % (ref 33–65)
CD4 T Cell Abs: 537 /uL (ref 400–1790)

## 2020-03-10 LAB — CBC WITH DIFFERENTIAL/PLATELET
Absolute Monocytes: 381 cells/uL (ref 200–950)
Basophils Absolute: 30 cells/uL (ref 0–200)
Basophils Relative: 1.1 %
Eosinophils Absolute: 41 cells/uL (ref 15–500)
Eosinophils Relative: 1.5 %
HCT: 43.7 % (ref 38.5–50.0)
Hemoglobin: 15.3 g/dL (ref 13.2–17.1)
Lymphs Abs: 1256 cells/uL (ref 850–3900)
MCH: 32.8 pg (ref 27.0–33.0)
MCHC: 35 g/dL (ref 32.0–36.0)
MCV: 93.8 fL (ref 80.0–100.0)
MPV: 9.8 fL (ref 7.5–12.5)
Monocytes Relative: 14.1 %
Neutro Abs: 994 cells/uL — ABNORMAL LOW (ref 1500–7800)
Neutrophils Relative %: 36.8 %
Platelets: 199 10*3/uL (ref 140–400)
RBC: 4.66 10*6/uL (ref 4.20–5.80)
RDW: 14.3 % (ref 11.0–15.0)
Total Lymphocyte: 46.5 %
WBC: 2.7 10*3/uL — ABNORMAL LOW (ref 3.8–10.8)

## 2020-03-10 LAB — COMPLETE METABOLIC PANEL WITH GFR
AG Ratio: 1.7 (calc) (ref 1.0–2.5)
ALT: 18 U/L (ref 9–46)
AST: 21 U/L (ref 10–35)
Albumin: 3.8 g/dL (ref 3.6–5.1)
Alkaline phosphatase (APISO): 52 U/L (ref 35–144)
BUN/Creatinine Ratio: 13 (calc) (ref 6–22)
BUN: 18 mg/dL (ref 7–25)
CO2: 26 mmol/L (ref 20–32)
Calcium: 8.4 mg/dL — ABNORMAL LOW (ref 8.6–10.3)
Chloride: 109 mmol/L (ref 98–110)
Creat: 1.44 mg/dL — ABNORMAL HIGH (ref 0.70–1.33)
GFR, Est African American: 62 mL/min/{1.73_m2} (ref >=60)
GFR, Est Non African American: 53 mL/min/{1.73_m2} — ABNORMAL LOW (ref >=60)
Globulin: 2.2 g/dL (calc) (ref 1.9–3.7)
Glucose, Bld: 110 mg/dL — ABNORMAL HIGH (ref 65–99)
Potassium: 3.9 mmol/L (ref 3.5–5.3)
Sodium: 140 mmol/L (ref 135–146)
Total Bilirubin: 0.8 mg/dL (ref 0.2–1.2)
Total Protein: 6 g/dL — ABNORMAL LOW (ref 6.1–8.1)

## 2020-03-10 LAB — HIV-1 RNA QUANT-NO REFLEX-BLD
HIV 1 RNA Quant: <20 copies/mL — AB
HIV-1 RNA Quant, Log: <1.3 Log copies/mL — AB

## 2020-03-10 LAB — LIPID PANEL
Cholesterol: 139 mg/dL (ref <200)
HDL: 37 mg/dL — ABNORMAL LOW (ref >=40)
LDL Cholesterol (Calc): 82 mg/dL (calc)
Non-HDL Cholesterol (Calc): 102 mg/dL (calc) (ref <130)
Total CHOL/HDL Ratio: 3.8 (calc) (ref <5.0)
Triglycerides: 107 mg/dL (ref <150)

## 2020-03-10 LAB — RPR: RPR Ser Ql: NONREACTIVE

## 2020-03-12 ENCOUNTER — Telehealth: Payer: Self-pay | Admitting: General Practice

## 2020-03-12 NOTE — Telephone Encounter (Signed)
° °  Left message to advise Dr Okey Dupre is not accepting new patients. Offer other locations

## 2020-03-17 ENCOUNTER — Telehealth: Payer: Self-pay | Admitting: Pharmacy Technician

## 2020-03-17 NOTE — Telephone Encounter (Signed)
[facility] Patient Advocate Encounter  Attempted to reach the patient to set up his patient care initial at [facility]. Left a voicemail on his new cell number to find out how best to get the medication to him. His current living situation may make it tricky to coordinate medication each month. Will continue to try and reach the patient. Phone number to reach clinic/pharmacy was left on the voicemail.   Beulah Gandy, CPhT Specialty Pharmacy Patient [facility] Phone: [phone] Fax: [phone] 03/17/2020 9:25 AM

## 2020-03-23 ENCOUNTER — Encounter (HOSPITAL_COMMUNITY): Payer: Medicare Other

## 2020-04-06 ENCOUNTER — Encounter: Payer: Medicare Other | Admitting: Cardiology

## 2020-04-06 NOTE — Progress Notes (Deleted)
Electrophysiology Office Note   Date:  04/06/2020   ID:  Ryan Long, DOB 1961-01-22, MRN [id number]  PCP:  Antony Blackbird, MD  Cardiologist:  [facility] Primary Electrophysiologist:  Marce Schartz Meredith Leeds, MD    No chief complaint on file.    History of Present Illness: Ryan Long is a 59 y.o. male who is being seen today for the evaluation of CHF at the request of Oda Kilts. Presenting today for electrophysiology evaluation. He has a history of tobacco abuse, alcohol abuse, bipolar 1, nonischemic cardiomyopathy, COPD, HIV, hyperlipidemia, hypertension.  His nonischemic cardiomyopathy was not felt due to HIV due to good CD4 counts.  He is no longer using cocaine.  He is try to cut back on smoking.  He is currently living in a shelter in [place].   Today, he denies symptoms of palpitations, chest pain, shortness of breath, orthopnea, PND, lower extremity edema, claudication, dizziness, presyncope, syncope, bleeding, or neurologic sequela. The patient is tolerating medications without difficulties.  He does have some mild fatigue and shortness of breath.  He has been feeling well otherwise.  No major complaints today.  Past Medical History:  Diagnosis Date  . Active smoker   . AICD (automatic cardioverter/defibrillator) present   . Alcohol abuse   . Anxiety   . AR (allergic rhinitis)   . Bipolar 1 disorder ([place])   . CHF (congestive heart failure) ([place])   . Chronic bronchitis ([place])   . Chronic systolic heart failure ([place])   . COPD (chronic obstructive pulmonary disease) ([place])   . Cough 12/31/2018  . Crack cocaine use   . Depression   . Genital herpes   . HIV (human immunodeficiency virus infection) ([place]) dx'd 08/1993  . HLD (hyperlipidemia)   . Hypertension   . NICM (nonischemic cardiomyopathy) ([place])    Echocardiogram 06/28/11: EF 30-35%, mild MR, mild LAE;  No CAD by coronary CT [facility] 3[facility]12 [facility]  . NSVT (nonsustained  ventricular tachycardia) ([place])   . PTSD (post-traumatic stress disorder)    Past Surgical History:  Procedure Laterality Date  . CARDIAC DEFIBRILLATOR PLACEMENT  01/08/2018  . ICD IMPLANT N/A 01/08/2018   Procedure: ICD IMPLANT;  Surgeon: Constance Haw, MD;  Location: [facility] CV LAB;  Service: Cardiovascular;  Laterality: N/A;     Current Outpatient Medications  Medication Sig Dispense Refill  . aspirin 81 MG chewable tablet Chew 1 tablet (81 mg total) by mouth every morning. For heart health 30 tablet 11  . bictegravir-emtricitabine-tenofovir AF (BIKTARVY) 50-200-25 MG TABS tablet Take 1 tablet by mouth daily. 30 tablet 11  . buPROPion (WELLBUTRIN XL) 150 MG 24 hr tablet Take by mouth.    . digoxin (LANOXIN) 0.125 MG tablet TAKE 1 TABLET BY MOUTH EVERY DAY FOR HEART ARRHYTHMIA, PLEASE SCHEDULE AN APPOINTMENT FOR FURTHER REFILLS 30 tablet 0  . eplerenone (INSPRA) 25 MG tablet TAKE 1/2 TABLET(12.5 MG) BY MOUTH DAILY 45 tablet 0  . finasteride (PROSCAR) 5 MG tablet Take 5 mg by mouth daily.    . furosemide (LASIX) 40 MG tablet Take 1 tablet (40 mg total) by mouth daily. 90 tablet 1  . hydrOXYzine (VISTARIL) 50 MG capsule Take 50 mg by mouth at bedtime as needed.    Marland Kitchen losartan (COZAAR) 50 MG tablet TAKE 1 TABLET(50 MG) BY MOUTH DAILY 90 tablet 1  . Melatonin 10 MG TBDP 10 mg.    . metoprolol succinate (TOPROL-XL) 100 MG 24 hr tablet Take 1 tablet (  100 mg total) by mouth at bedtime. For high blood pressure 30 tablet 11  . polyethylene glycol powder (GLYCOLAX/MIRALAX) 17 GM/SCOOP powder     . potassium chloride SA (K-DUR,KLOR-CON) 20 MEQ tablet Take 1 tablet (20 mEq total) by mouth daily. 90 tablet 1  . sildenafil (VIAGRA) 50 MG tablet Take 1 tablet (50 mg total) by mouth daily as needed for erectile dysfunction. At least 24 hours between doses 20 tablet 0  . tamsulosin (FLOMAX) 0.4 MG CAPS capsule Take 0.8 mg by mouth daily.    . valACYclovir (VALTREX) 500 MG tablet Take 1 tablet  (500 mg total) by mouth daily. For Herpes. 30 tablet 1  . VENTOLIN HFA 108 (90 Base) MCG/ACT inhaler INHALE 2 PUFFS INTO THE LUNGS EVERY 6 HOURS AS NEEDED FOR WHEEZING OR SHORTNESS OF BREATH 18 g 3   No current facility-administered medications for this visit.    Allergies:   Patient has no known allergies.   Social History:  The patient  reports that he has been smoking cigarettes. He has a 28.50 pack-year smoking history. He has never used smokeless tobacco. He reports previous alcohol use of about 6.0 standard drinks of alcohol per week. He reports previous drug use. Drug: Cocaine.   Family History:  The patient's family history includes Alcohol abuse in his brother and father; CAD in his father; Drug abuse in his brother, brother, and father; Early death in his father; Glaucoma in his mother; Heart attack (age of onset: 54) in his father; Hypertension in his father; Mental illness in his father and mother; Vision loss in his mother.    ROS:  Please see the history of present illness.   Otherwise, review of systems is positive for fatigue, palpitations, shortness, wheezing, back pain,.   All other systems are reviewed and negative.    PHYSICAL EXAM: VS:  There were no vitals taken for this visit. , BMI There is no height or weight on file to calculate BMI. GEN: Well nourished, well developed, in no acute distress  HEENT: normal  Neck: no JVD, carotid bruits, or masses Cardiac: RRR; no murmurs, rubs, or gallops,no edema  Respiratory:  clear to auscultation bilaterally, normal work of breathing GI: soft, nontender, nondistended, + BS MS: no deformity or atrophy  Skin: warm and dry Neuro:  Strength and sensation are intact Psych: euthymic mood, full affect  EKG:  EKG is ordered today. Personal review of the ekg ordered shows sinus rhythm, rate 102, PVCs, septal Q waves  Recent Labs: 03/08/2020: ALT 18; BUN 18; Creat 1.44; Hemoglobin 15.3; Platelets 199; Potassium 3.9; Sodium 140     Lipid Panel     Component Value Date/Time   CHOL 139 03/08/2020 0909   TRIG 107 03/08/2020 0909   HDL 37 (L) 03/08/2020 0909   CHOLHDL 3.8 03/08/2020 0909   VLDL 39 (H) 04/18/2016 1622   LDLCALC 82 03/08/2020 0909     Wt Readings from Last 3 Encounters:  03/08/20 205 lb (93 kg)  03/24/19 211 lb (95.7 kg)  12/31/18 211 lb (95.7 kg)      Other studies Reviewed: Additional studies/ records that were reviewed today include: TTE 10/10/17  Review of the above records today demonstrates:  - Left ventricle: The cavity size was normal. Wall thickness was   normal. Systolic function was severely reduced. The estimated   ejection fraction was in the range of 25% to 30%. Diffuse   hypokinesis. Doppler parameters are consistent with abnormal left   ventricular  relaxation (grade 1 diastolic dysfunction).   ASSESSMENT AND PLAN:  1.  Chronic systolic heart failure due to nonischemic cardiomyopathy: Currently on optimal medical therapy with Toprol-XL losartan as well as eplerenone.  Now status post [facility] Jude ICD implanted 01/08/2018.  Device functioning appropriately.  No changes.  2.  HIV: Follow-up with Dr. Algis Liming.  No changes.  3.  Tobacco abuse: Complete cessation encouraged  4.  Cocaine abuse: Continued abstinence encouraged  Current medicines are reviewed at length with the patient today.   The patient does not have concerns regarding his medicines.  The following changes were made today:  ***  Labs/ tests ordered today include:  No orders of the defined types were placed in this encounter.    Disposition:   FU with Keiston Manley *** months  Signed, Sopheap Basic [name], MD  04/06/2020 1:58 PM     [facility] [address] [place] [address] [phone] (office) [phone] (fax)

## 2020-04-09 ENCOUNTER — Encounter: Payer: Self-pay | Admitting: Cardiology

## 2020-06-10 DIAGNOSIS — I480 Paroxysmal atrial fibrillation: Secondary | ICD-10-CM | POA: Insufficient documentation

## 2020-06-10 DIAGNOSIS — I4891 Unspecified atrial fibrillation: Secondary | ICD-10-CM | POA: Insufficient documentation

## 2020-06-24 ENCOUNTER — Emergency Department (HOSPITAL_COMMUNITY): Payer: Medicare Other

## 2020-06-24 ENCOUNTER — Emergency Department (HOSPITAL_COMMUNITY)
Admission: EM | Admit: 2020-06-24 | Discharge: 2020-06-25 | Disposition: A | Discharge status: Other Acute Inpatient Hospital | Payer: Medicare Other | Attending: Emergency Medicine | Admitting: Emergency Medicine

## 2020-06-24 ENCOUNTER — Other Ambulatory Visit: Payer: Self-pay

## 2020-06-24 DIAGNOSIS — I5022 Chronic systolic (congestive) heart failure: Secondary | ICD-10-CM | POA: Insufficient documentation

## 2020-06-24 DIAGNOSIS — F17210 Nicotine dependence, cigarettes, uncomplicated: Secondary | ICD-10-CM | POA: Insufficient documentation

## 2020-06-24 DIAGNOSIS — R441 Visual hallucinations: Secondary | ICD-10-CM

## 2020-06-24 DIAGNOSIS — Z7982 Long term (current) use of aspirin: Secondary | ICD-10-CM | POA: Insufficient documentation

## 2020-06-24 DIAGNOSIS — J449 Chronic obstructive pulmonary disease, unspecified: Secondary | ICD-10-CM | POA: Insufficient documentation

## 2020-06-24 DIAGNOSIS — F1410 Cocaine abuse, uncomplicated: Secondary | ICD-10-CM | POA: Insufficient documentation

## 2020-06-24 DIAGNOSIS — Z79899 Other long term (current) drug therapy: Secondary | ICD-10-CM | POA: Insufficient documentation

## 2020-06-24 DIAGNOSIS — F333 Major depressive disorder, recurrent, severe with psychotic symptoms: Secondary | ICD-10-CM | POA: Insufficient documentation

## 2020-06-24 DIAGNOSIS — F1010 Alcohol abuse, uncomplicated: Secondary | ICD-10-CM | POA: Insufficient documentation

## 2020-06-24 DIAGNOSIS — Z20822 Contact with and (suspected) exposure to covid-19: Secondary | ICD-10-CM | POA: Insufficient documentation

## 2020-06-24 DIAGNOSIS — R45851 Suicidal ideations: Secondary | ICD-10-CM

## 2020-06-24 DIAGNOSIS — I110 Hypertensive heart disease with heart failure: Secondary | ICD-10-CM | POA: Insufficient documentation

## 2020-06-24 LAB — COMPREHENSIVE METABOLIC PANEL
ALT: 28 U/L (ref 0–44)
AST: 28 U/L (ref 15–41)
Albumin: 4 g/dL (ref 3.5–5.0)
Alkaline Phosphatase: 54 U/L (ref 38–126)
Anion gap: 10 (ref 5–15)
BUN: 7 mg/dL (ref 6–20)
CO2: 24 mmol/L (ref 22–32)
Calcium: 8.8 mg/dL — ABNORMAL LOW (ref 8.9–10.3)
Chloride: 106 mmol/L (ref 98–111)
Creatinine, Ser: 1.17 mg/dL (ref 0.61–1.24)
GFR calc Af Amer: >60 mL/min (ref >60)
GFR calc non Af Amer: >60 mL/min (ref >60)
Glucose, Bld: 87 mg/dL (ref 70–99)
Potassium: 3.7 mmol/L (ref 3.5–5.1)
Sodium: 140 mmol/L (ref 135–145)
Total Bilirubin: 1.7 mg/dL — ABNORMAL HIGH (ref 0.3–1.2)
Total Protein: 6.9 g/dL (ref 6.5–8.1)

## 2020-06-24 LAB — CBC WITH DIFFERENTIAL/PLATELET
Abs Immature Granulocytes: 0.01 10*3/uL (ref 0.00–0.07)
Basophils Absolute: 0 10*3/uL (ref 0.0–0.1)
Basophils Relative: 1 %
Eosinophils Absolute: 0 10*3/uL (ref 0.0–0.5)
Eosinophils Relative: 1 %
HCT: 47.8 % (ref 39.0–52.0)
Hemoglobin: 16.5 g/dL (ref 13.0–17.0)
Immature Granulocytes: 0 %
Lymphocytes Relative: 37 %
Lymphs Abs: 1.2 10*3/uL (ref 0.7–4.0)
MCH: 32 pg (ref 26.0–34.0)
MCHC: 34.5 g/dL (ref 30.0–36.0)
MCV: 92.8 fL (ref 80.0–100.0)
Monocytes Absolute: 0.3 10*3/uL (ref 0.1–1.0)
Monocytes Relative: 10 %
Neutro Abs: 1.7 10*3/uL (ref 1.7–7.7)
Neutrophils Relative %: 51 %
Platelets: 166 10*3/uL (ref 150–400)
RBC: 5.15 MIL/uL (ref 4.22–5.81)
RDW: 13.4 % (ref 11.5–15.5)
WBC: 3.4 10*3/uL — ABNORMAL LOW (ref 4.0–10.5)
nRBC: 0 % (ref 0.0–0.2)

## 2020-06-24 LAB — RAPID URINE DRUG SCREEN, HOSP PERFORMED
Amphetamines: NOT DETECTED
Barbiturates: NOT DETECTED
Benzodiazepines: NOT DETECTED
Cocaine: POSITIVE — AB
Opiates: NOT DETECTED
Tetrahydrocannabinol: NOT DETECTED

## 2020-06-24 LAB — SARS CORONAVIRUS 2 BY RT PCR (HOSPITAL ORDER, PERFORMED IN ~~LOC~~ HOSPITAL LAB): SARS Coronavirus 2: NEGATIVE

## 2020-06-24 LAB — ETHANOL: Alcohol, Ethyl (B): <10 mg/dL (ref <10)

## 2020-06-24 NOTE — Progress Notes (Signed)
06/24/2020  1855  Patient belonging in locker 28 and suitcase in dayroom.

## 2020-06-24 NOTE — ED Provider Notes (Signed)
[facility]-EMERGENCY DEPT Provider Note   CSN: [id number] Arrival date & time: 06/24/20  1830     History No chief complaint on file.   Ryan Long is a 59 y.o. male.  The history is provided by the patient and medical records. No language interpreter was used.  Mental Health Problem Presenting symptoms: depression, hallucinations and suicidal thoughts   Presenting symptoms: no aggressive behavior, no agitation, no delusions, no homicidal ideas, no paranoid behavior, no suicidal threats and no suicide attempt   Degree of incapacity (severity):  Severe Onset quality:  Gradual Duration:  10 days Timing:  Constant Progression:  Worsening Chronicity:  New Context: alcohol use and drug abuse   Context: not noncompliant and not recent medication change   Treatment compliance:  Untreated Relieved by:  Nothing Worsened by:  Nothing Ineffective treatments:  None tried Associated symptoms: no abdominal pain, no anxiety, no chest pain, no fatigue and no headaches   Risk factors: hx of suicide attempts        Past Medical History:  Diagnosis Date  . Active smoker   . AICD (automatic cardioverter/defibrillator) present   . Alcohol abuse   . Anxiety   . AR (allergic rhinitis)   . Bipolar 1 disorder (HCC)   . CHF (congestive heart failure) (HCC)   . Chronic bronchitis (HCC)   . Chronic systolic heart failure (HCC)   . COPD (chronic obstructive pulmonary disease) (HCC)   . Cough 12/31/2018  . Crack cocaine use   . Depression   . Genital herpes   . HIV (human immunodeficiency virus infection) (HCC) dx'd 08/1993  . HLD (hyperlipidemia)   . Hypertension   . NICM (nonischemic cardiomyopathy) (HCC)    Echocardiogram 06/28/11: EF 30-35%, mild MR, mild LAE;  No CAD by coronary CT [facility] 3/12 [facility]  . NSVT (nonsustained ventricular tachycardia) (HCC)   . PTSD (post-traumatic stress disorder)     Patient Active Problem List    Diagnosis Date Noted  . Cough 12/31/2018  . Controlled substance agreement signed 10/22/2018  . AICD (automatic cardioverter/defibrillator) present 08/15/2018  . Abnormal EKG 06/28/2018  . Cocaine abuse (HCC) 06/25/2018  . Encounter for other general examination 06/25/2018  . Current severe episode of major depressive disorder with psychotic features without prior episode (HCC) 06/24/2018  . MDD (major depressive disorder), recurrent episode, moderate (HCC) 06/06/2018  . Current moderate episode of major depressive disorder (HCC) 05/24/2018  . Adjustment disorder with mixed disturbance of emotions and conduct   . NICM (nonischemic cardiomyopathy) (HCC) 01/08/2018  . Suicidal ideation 12/25/2017  . Anxiety 12/20/2017  . Bipolar 1 disorder (HCC) 10/10/2017  . Breast lump 10/03/2017  . Chronic systolic heart failure (HCC) 05/13/2017  . Genital herpes 05/13/2017  . HSV-2 infection 07/17/2016  . 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 Nonpsychotic mental disorder 06/22/2016  . Screening examination for venereal disease 04/18/2016  . Encounter for long-term (current) use of medications 04/18/2016  . History of attempted suicide 04/14/2016  . History of alcoholism (HCC) 04/14/2016  . History of substance abuse (HCC) 04/14/2016  . Constipation 01/17/2016  . Homelessness 01/11/2016  . Major depressive disorder, recurrent, unspecified (HCC) 10/08/2015  . Chronic obstructive pulmonary disease (HCC) 10/04/2015  . Abnormal thyroid stimulating hormone (TSH) level 10/04/2015  . MDD (major depressive disorder), recurrent episode, severe (HCC) 09/23/2015  . Hypertension 09/10/2015  . MDD (major depressive disorder), recurrent, severe, with psychosis (HCC) 08/07/2015  .  PTSD (post-traumatic stress disorder) 08/07/2015  . Cocaine use disorder, severe, dependence (HCC) 08/07/2015  . Major depressive disorder, recurrent severe without  psychotic features (HCC) 04/26/2015  . Primary insomnia 04/02/2015  . Herpesviral infection of penis 04/02/2015  . Stimulant use disorder 05/29/2014  . Generalized anxiety disorder 05/29/2014  . Nondependent cannabis abuse, episodic 02/16/2014  . Cannabis abuse, uncomplicated 02/16/2014  . Polysubstance (excluding opioids) dependence with physiological dependence (HCC) 12/30/2013  . Substance induced mood disorder (HCC) 11/22/2013  . Tobacco use disorder 02/17/2013  . Cocaine abuse, episodic use (HCC) 03/20/2012    Class: Acute  . Alcohol dependence (HCC) 03/20/2012    Class: Acute  . Alcohol use disorder, severe, dependence (HCC) 03/20/2012  . Heart failure (HCC) 09/19/2011  . Neutropenia (HCC) 09/19/2011  . Acute congestive heart failure (HCC) 09/19/2011  . Alcohol use with alcohol-induced disorder (HCC) 09/19/2011  . Major depressive disorder, single episode, unspecified 09/17/2011  . Other primary cardiomyopathies 08/24/2011  . Human immunodeficiency virus (HIV) disease (HCC) 02/01/2009  . Recurrent HSV (herpes simplex virus) 02/01/2009  . Hyperlipidemia 02/01/2009  . Allergic rhinitis 02/01/2009    Past Surgical History:  Procedure Laterality Date  . CARDIAC DEFIBRILLATOR PLACEMENT  01/08/2018  . ICD IMPLANT N/A 01/08/2018   Procedure: ICD IMPLANT;  Surgeon: Regan Lemming, MD;  Location: [facility] INVASIVE CV LAB;  Service: Cardiovascular;  Laterality: N/A;       Family History  Problem Relation Age of Onset  . Glaucoma Mother   . Mental illness Mother   . Vision loss Mother   . Hypertension Father   . CAD Father   . Mental illness Father   . Alcohol abuse Father   . Drug abuse Father   . Heart attack Father 35  . Early death Father   . Alcohol abuse Brother   . Drug abuse Brother   . Drug abuse Brother     Social History   Tobacco Use  . Smoking status: Current Every Day Smoker    Packs/day: 0.75    Years: 38.00    Pack years: 28.50    Types:  Cigarettes  . Smokeless tobacco: Never Used  . Tobacco comment: hoping welbutrin will help  Vaping Use  . Vaping Use: Never used  Substance Use Topics  . Alcohol use: Not Currently    Alcohol/week: 6.0 standard drinks    Types: 6 Cans of beer per week    Comment: 01/08/2018 "stopped 06/2017"  . Drug use: Not Currently    Types: Cocaine    Comment: 01/08/2018 "stopped 06/2017"    Home Medications Prior to Admission medications   Medication Sig Start Date End Date Taking? Authorizing Provider  aspirin 81 MG chewable tablet Chew 1 tablet (81 mg total) by mouth every morning. For heart health 03/26/18   [name] D, NP  bictegravir-emtricitabine-tenofovir AF (BIKTARVY) 50-200-25 MG TABS tablet Take 1 tablet by mouth daily. 03/08/20   Randall Hiss, MD  buPROPion (WELLBUTRIN XL) 150 MG 24 hr tablet Take by mouth. 01/19/20   [provider]  digoxin (LANOXIN) 0.125 MG tablet TAKE 1 TABLET BY MOUTH EVERY DAY FOR HEART ARRHYTHMIA, PLEASE SCHEDULE AN APPOINTMENT FOR FURTHER REFILLS 06/23/19   Bensimhon, Bevelyn Buckles, MD  eplerenone (INSPRA) 25 MG tablet TAKE 1/2 TABLET(12.5 MG) BY MOUTH DAILY 02/10/19   Bensimhon, Bevelyn Buckles, MD  finasteride (PROSCAR) 5 MG tablet Take 5 mg by mouth daily. 02/26/20   [provider]  furosemide (LASIX) 40 MG tablet  Take 1 tablet (40 mg total) by mouth daily. 01/07/18   Bensimhon, Bevelyn Buckles, MD  hydrOXYzine (VISTARIL) 50 MG capsule Take 50 mg by mouth at bedtime as needed. 02/26/20   [provider]  losartan (COZAAR) 50 MG tablet TAKE 1 TABLET(50 MG) BY MOUTH DAILY 01/07/18   Bensimhon, Bevelyn Buckles, MD  Melatonin 10 MG TBDP 10 mg.    [provider]  metoprolol succinate (TOPROL-XL) 100 MG 24 hr tablet Take 1 tablet (100 mg total) by mouth at bedtime. For high blood pressure 04/18/19   Daiva Eves, Lisette Grinder, MD  polyethylene glycol powder [facility]) 17 GM/SCOOP powder  11/29/19   [provider]  potassium chloride SA  (K-DUR,KLOR-CON) 20 MEQ tablet Take 1 tablet (20 mEq total) by mouth daily. 01/07/18   Bensimhon, Bevelyn Buckles, MD  sildenafil (VIAGRA) 50 MG tablet Take 1 tablet (50 mg total) by mouth daily as needed for erectile dysfunction. At least 24 hours between doses 01/21/18   Hoy Register, MD  tamsulosin (FLOMAX) 0.4 MG CAPS capsule Take 0.8 mg by mouth daily. 02/16/20   [provider]  valACYclovir (VALTREX) 500 MG tablet Take 1 tablet (500 mg total) by mouth daily. For Herpes. 03/17/19   Fulp, Cammie, MD  VENTOLIN HFA 108 (90 Base) MCG/ACT inhaler INHALE 2 PUFFS INTO THE LUNGS EVERY 6 HOURS AS NEEDED FOR WHEEZING OR SHORTNESS OF BREATH 03/17/19   Fulp, Cammie, MD    Allergies    Patient has no known allergies.  Review of Systems   Review of Systems  Constitutional: Negative for chills, diaphoresis, fatigue and fever.  HENT: Negative for congestion.   Respiratory: Negative for cough, chest tightness, shortness of breath and wheezing.   Cardiovascular: Negative for chest pain and palpitations.  Gastrointestinal: Negative for abdominal pain, constipation, diarrhea, nausea and vomiting.  Genitourinary: Negative for dysuria and frequency.  Musculoskeletal: Negative for back pain, neck pain and neck stiffness.  Skin: Negative for rash and wound.  Neurological: Negative for dizziness, weakness, light-headedness, numbness and headaches.  Psychiatric/Behavioral: Positive for hallucinations and suicidal ideas. Negative for agitation, confusion, homicidal ideas and paranoia. The patient is not nervous/anxious.   All other systems reviewed and are negative.   Physical Exam Updated Vital Signs BP 112/79   Pulse 96   Temp 98.7 F (37.1 C)   Resp 18   SpO2 96%   Physical Exam Vitals and nursing note reviewed.  Constitutional:      Appearance: He is well-developed.  HENT:     Head: Normocephalic and atraumatic.  Eyes:     Conjunctiva/sclera: Conjunctivae normal.  Cardiovascular:     Rate and  Rhythm: Normal rate and regular rhythm.     Pulses: Normal pulses.     Heart sounds: No murmur heard.   Pulmonary:     Effort: Pulmonary effort is normal. No respiratory distress.     Breath sounds: Normal breath sounds. No wheezing, rhonchi or rales.  Chest:     Chest wall: No tenderness.  Abdominal:     Palpations: Abdomen is soft.     Tenderness: There is no abdominal tenderness.  Musculoskeletal:        General: No tenderness.     Cervical back: Neck supple. No tenderness.  Skin:    General: Skin is warm and dry.     Capillary Refill: Capillary refill takes less than 2 seconds.     Findings: No rash.  Neurological:     General: No focal deficit present.  Mental Status: He is alert.  Psychiatric:        Attention and Perception: He perceives visual hallucinations.        Mood and Affect: Mood is depressed.        Behavior: Behavior normal. Behavior is not agitated, aggressive or hyperactive.        Thought Content: Thought content is not paranoid. Thought content includes suicidal ideation. Thought content does not include homicidal ideation. Thought content does not include homicidal or suicidal plan.     ED Results / Procedures / Treatments   Labs (all labs ordered are listed, but only abnormal results are displayed) Labs Reviewed  COMPREHENSIVE METABOLIC PANEL - Abnormal; Notable for the following components:      Result Value   Calcium 8.8 (*)    Total Bilirubin 1.7 (*)    All other components within normal limits  RAPID URINE DRUG SCREEN, HOSP PERFORMED - Abnormal; Notable for the following components:   Cocaine POSITIVE (*)    All other components within normal limits  CBC WITH DIFFERENTIAL/PLATELET - Abnormal; Notable for the following components:   WBC 3.4 (*)    All other components within normal limits  SARS CORONAVIRUS 2 BY RT PCR [facility] ORDER, PERFORMED IN [facility])  ETHANOL    EKG EKG Interpretation  Date/Time:  Thursday June 24 2020 19:33:57 EDT Ventricular Rate:  91 PR Interval:  192 QRS Duration: 82 QT Interval:  402 QTC Calculation: 494 R Axis:   50 Text Interpretation: Normal sinus rhythm Septal infarct , age undetermined T wave abnormality, consider inferolateral ischemia Abnormal ECG When compared to prior, no significant changes seen. No STEMI Confirmed by Theda Belfast ([id number]) on 06/24/2020 7:56:36 PM   Radiology DG Chest 2 View  Result Date: 06/24/2020 CLINICAL DATA:  Chest pain and shortness of breath. Cough for 5 days. EXAM: CHEST - 2 VIEW COMPARISON:  01/04/2019 FINDINGS: Single lead left-sided pacemaker remains in place. The cardiomediastinal contours are normal. Mild hyperinflation with bronchial thickening. Streaky left lung base atelectasis or scarring. Pulmonary vasculature is normal. No consolidation, pleural effusion, or pneumothorax. No acute osseous abnormalities are seen. IMPRESSION: 1. Mild hyperinflation and bronchial thickening, suggesting, smoking related lung disease, asthma or bronchitis. 2. Streaky left lung base atelectasis or scarring. Electronically Signed   By: Narda Rutherford M.D.   On: 06/24/2020 20:43    Procedures Procedures (including critical care time)  Medications Ordered in ED Medications - No data to display  ED Course  I have reviewed the triage vital signs and the nursing notes.  Pertinent labs & imaging results that were available during my care of the patient were reviewed by me and considered in my medical decision making (see chart for details).    MDM Rules/Calculators/A&P                          Seville Downs is a 59 y.o. male with a past medical history significant for HIV on antiretrovirals, hyperlipidemia, hypertension, substance abuse with crack cocaine and alcohol abuse, COPD, CHF status post AICD, and PTSD who presents with worsening depression and hallucinations.  Patient reports that over the last 10 days he has had worsening depression.   He is starting to have suicidal thoughts.  He does not have a current plan but he does report his try to kill himself in the past by walking out into traffic.  He denies homicidal ideation.  He reports  has been having more visual [place] and he is seeing bugs on the walls.  He denies any auditory hallucinations.  He denies missing medications but he would like help because he is concerned he may try to hurt himself.  He has been drinking alcohol but has not had a drink today.  He denies any history of alcohol withdrawal problems.  Denies history of seizures.  He reports his last crack cocaine use was today and he does report that drug seem to make his symptoms worse.  He would like help with substance abuse as well.  He denies any physical complaints other than a cough that has had a yellow sputum for the last few days.  He denies any fevers.  Denies any chest pain, shortness breath, palpitations, nausea vomiting, urinary symptoms, or GI symptoms.  On exam, lungs are clear and chest is nontender.  No murmur.  Abdomen nontender.  Patient moving all extremities.  Normal gait.  Patient resting comfortably but is actively having hallucinations of seeing things on the walls.  Patient will have chest x-ray given his HIV and productive cough.  We will test for Covid although he does report he has had his vaccinations.  We will get screening labs to medically clear him.  Anticipate psychiatric evaluation by TTS given the depression worsening, suicidal thoughts, and some hallucinations.  Anticipate reassessment after work-up.  Laboratory testing reassuring.  X-ray reassuring with no evidence of pneumonia.  Covid test negative.  He is medically clear for psychiatric evaluation and recommendations.   Final Clinical Impression(s) / ED Diagnoses Final diagnoses:  Visual hallucinations  Suicidal ideation     Clinical Impression: 1. Visual hallucinations   2. Suicidal ideation     Disposition: Awaiting  psych recs with TTS consult placed.   This note was prepared with assistance of Conservation officer, historic buildings. Occasional wrong-word or sound-a-like substitutions may have occurred due to the inherent limitations of voice recognition software.     Anne-Marie Genson, Canary Brim, MD 06/24/20 [phone]

## 2020-06-24 NOTE — ED Notes (Signed)
Pt talking with TTS  

## 2020-06-24 NOTE — Progress Notes (Signed)
06/24/2020  1922 Placed in locker #28 [place], cell phone, watch, lighter, bracelet, cigarettes, and loose change in a plastic bag.

## 2020-06-24 NOTE — ED Notes (Signed)
Pt provided with a [place] sandwich, cheese and soda. Pt calm, cooperative and follows commands.

## 2020-06-24 NOTE — ED Triage Notes (Signed)
06/24/2020  1855  Patient states he is experiencing depression and hallucinations. Pt denies SI/HI.

## 2020-06-25 ENCOUNTER — Encounter (HOSPITAL_COMMUNITY): Payer: Self-pay | Admitting: Nurse Practitioner

## 2020-06-25 ENCOUNTER — Inpatient Hospital Stay (HOSPITAL_COMMUNITY)
Admission: AD | Admit: 2020-06-25 | Discharge: 2020-07-01 | DRG: 885 | Disposition: A | Discharge status: Home / Self Care | Payer: Medicare Other | Source: Intra-hospital | Attending: Psychiatry | Admitting: Psychiatry

## 2020-06-25 DIAGNOSIS — B20 Human immunodeficiency virus [HIV] disease: Secondary | ICD-10-CM | POA: Diagnosis present

## 2020-06-25 DIAGNOSIS — F19929 Other psychoactive substance use, unspecified with intoxication, unspecified: Secondary | ICD-10-CM | POA: Diagnosis present

## 2020-06-25 DIAGNOSIS — Z95810 Presence of automatic (implantable) cardiac defibrillator: Secondary | ICD-10-CM | POA: Diagnosis not present

## 2020-06-25 DIAGNOSIS — R7989 Other specified abnormal findings of blood chemistry: Secondary | ICD-10-CM | POA: Diagnosis present

## 2020-06-25 DIAGNOSIS — G4700 Insomnia, unspecified: Secondary | ICD-10-CM | POA: Diagnosis present

## 2020-06-25 DIAGNOSIS — R45851 Suicidal ideations: Secondary | ICD-10-CM | POA: Diagnosis present

## 2020-06-25 DIAGNOSIS — I2510 Atherosclerotic heart disease of native coronary artery without angina pectoris: Secondary | ICD-10-CM | POA: Diagnosis present

## 2020-06-25 DIAGNOSIS — A6002 Herpesviral infection of other male genital organs: Secondary | ICD-10-CM | POA: Diagnosis present

## 2020-06-25 DIAGNOSIS — Z21 Asymptomatic human immunodeficiency virus [HIV] infection status: Secondary | ICD-10-CM | POA: Diagnosis present

## 2020-06-25 DIAGNOSIS — Z590 Homelessness: Secondary | ICD-10-CM | POA: Diagnosis not present

## 2020-06-25 DIAGNOSIS — I5022 Chronic systolic (congestive) heart failure: Secondary | ICD-10-CM | POA: Diagnosis present

## 2020-06-25 DIAGNOSIS — I10 Essential (primary) hypertension: Secondary | ICD-10-CM | POA: Diagnosis present

## 2020-06-25 DIAGNOSIS — B009 Herpesviral infection, unspecified: Secondary | ICD-10-CM | POA: Diagnosis present

## 2020-06-25 DIAGNOSIS — F19959 Other psychoactive substance use, unspecified with psychoactive substance-induced psychotic disorder, unspecified: Secondary | ICD-10-CM | POA: Diagnosis present

## 2020-06-25 DIAGNOSIS — I110 Hypertensive heart disease with heart failure: Secondary | ICD-10-CM | POA: Diagnosis present

## 2020-06-25 DIAGNOSIS — J449 Chronic obstructive pulmonary disease, unspecified: Secondary | ICD-10-CM | POA: Diagnosis present

## 2020-06-25 DIAGNOSIS — J45909 Unspecified asthma, uncomplicated: Secondary | ICD-10-CM | POA: Diagnosis present

## 2020-06-25 DIAGNOSIS — F333 Major depressive disorder, recurrent, severe with psychotic symptoms: Principal | ICD-10-CM

## 2020-06-25 DIAGNOSIS — E119 Type 2 diabetes mellitus without complications: Secondary | ICD-10-CM | POA: Diagnosis present

## 2020-06-25 DIAGNOSIS — F17210 Nicotine dependence, cigarettes, uncomplicated: Secondary | ICD-10-CM | POA: Diagnosis present

## 2020-06-25 DIAGNOSIS — F19951 Other psychoactive substance use, unspecified with psychoactive substance-induced psychotic disorder with hallucinations: Secondary | ICD-10-CM | POA: Diagnosis present

## 2020-06-25 DIAGNOSIS — E785 Hyperlipidemia, unspecified: Secondary | ICD-10-CM | POA: Diagnosis present

## 2020-06-25 DIAGNOSIS — Z7901 Long term (current) use of anticoagulants: Secondary | ICD-10-CM

## 2020-06-25 DIAGNOSIS — Z79899 Other long term (current) drug therapy: Secondary | ICD-10-CM

## 2020-06-25 DIAGNOSIS — N400 Enlarged prostate without lower urinary tract symptoms: Secondary | ICD-10-CM | POA: Diagnosis present

## 2020-06-25 DIAGNOSIS — Z818 Family history of other mental and behavioral disorders: Secondary | ICD-10-CM | POA: Diagnosis not present

## 2020-06-25 DIAGNOSIS — F17200 Nicotine dependence, unspecified, uncomplicated: Secondary | ICD-10-CM | POA: Diagnosis present

## 2020-06-25 DIAGNOSIS — J441 Chronic obstructive pulmonary disease with (acute) exacerbation: Secondary | ICD-10-CM | POA: Diagnosis present

## 2020-06-25 DIAGNOSIS — F1420 Cocaine dependence, uncomplicated: Secondary | ICD-10-CM | POA: Diagnosis present

## 2020-06-25 LAB — LIPID PANEL
Cholesterol: 125 mg/dL (ref 0–200)
HDL: 51 mg/dL (ref >40)
LDL Cholesterol: 43 mg/dL (ref 0–99)
Total CHOL/HDL Ratio: 2.5 RATIO
Triglycerides: 156 mg/dL — ABNORMAL HIGH (ref <150)
VLDL: 31 mg/dL (ref 0–40)

## 2020-06-25 LAB — HEMOGLOBIN A1C
Hgb A1c MFr Bld: 5.7 % — ABNORMAL HIGH (ref 4.8–5.6)
Mean Plasma Glucose: 116.89 mg/dL

## 2020-06-25 LAB — TSH: TSH: 0.245 u[IU]/mL — ABNORMAL LOW (ref 0.350–4.500)

## 2020-06-25 MED ORDER — VALACYCLOVIR HCL 500 MG PO TABS
500.0000 mg | ORAL_TABLET | Freq: Every day | ORAL | Status: DC
  Administered 2020-06-25 – 2020-07-01 (×7): 500 mg via ORAL
  Filled 2020-06-25 (×8): qty 1

## 2020-06-25 MED ORDER — LOSARTAN POTASSIUM 25 MG PO TABS
25.0000 mg | ORAL_TABLET | Freq: Every day | ORAL | Status: DC
  Administered 2020-06-25 – 2020-06-30 (×6): 25 mg via ORAL
  Filled 2020-06-25 (×8): qty 1

## 2020-06-25 MED ORDER — BICTEGRAVIR-EMTRICITAB-TENOFOV 50-200-25 MG PO TABS
1.0000 | ORAL_TABLET | Freq: Every day | ORAL | Status: DC
  Administered 2020-06-25 – 2020-07-01 (×7): 1 via ORAL
  Filled 2020-06-25 (×8): qty 1

## 2020-06-25 MED ORDER — ALBUTEROL SULFATE HFA 108 (90 BASE) MCG/ACT IN AERS: 1.0000 | INHALATION_SPRAY | Freq: Four times a day (QID) | RESPIRATORY_TRACT | Status: DC | PRN

## 2020-06-25 MED ORDER — HYDROXYZINE HCL 25 MG PO TABS
25.0000 mg | ORAL_TABLET | Freq: Three times a day (TID) | ORAL | Status: DC | PRN
  Administered 2020-06-25 – 2020-06-26 (×2): 25 mg via ORAL
  Filled 2020-06-25 (×2): qty 1

## 2020-06-25 MED ORDER — BUMETANIDE 1 MG PO TABS
1.0000 mg | ORAL_TABLET | Freq: Every day | ORAL | Status: DC
  Filled 2020-06-25 (×5): qty 1

## 2020-06-25 MED ORDER — FINASTERIDE 5 MG PO TABS
5.0000 mg | ORAL_TABLET | Freq: Every day | ORAL | Status: DC
  Administered 2020-06-25 – 2020-07-01 (×7): 5 mg via ORAL
  Filled 2020-06-25 (×8): qty 1

## 2020-06-25 MED ORDER — TAMSULOSIN HCL 0.4 MG PO CAPS
0.8000 mg | ORAL_CAPSULE | Freq: Every day | ORAL | Status: DC
  Administered 2020-06-25 – 2020-07-01 (×7): 0.8 mg via ORAL
  Filled 2020-06-25 (×7): qty 2

## 2020-06-25 MED ORDER — ALUM & MAG HYDROXIDE-SIMETH 200-200-20 MG/5ML PO SUSP: 30.0000 mL | ORAL | Status: DC | PRN

## 2020-06-25 MED ORDER — TRAZODONE HCL 100 MG PO TABS
100.0000 mg | ORAL_TABLET | Freq: Every evening | ORAL | Status: DC | PRN
  Administered 2020-06-25: 100 mg via ORAL
  Filled 2020-06-25: qty 1

## 2020-06-25 MED ORDER — TRAZODONE HCL 50 MG PO TABS: 50.0000 mg | ORAL_TABLET | Freq: Every evening | ORAL | Status: DC | PRN

## 2020-06-25 MED ORDER — METOPROLOL SUCCINATE ER 25 MG PO TB24
25.0000 mg | ORAL_TABLET | Freq: Every day | ORAL | Status: DC
  Administered 2020-06-25 – 2020-07-01 (×7): 25 mg via ORAL
  Filled 2020-06-25 (×8): qty 1

## 2020-06-25 MED ORDER — RIVAROXABAN 20 MG PO TABS
20.0000 mg | ORAL_TABLET | Freq: Every day | ORAL | Status: DC
  Administered 2020-06-25 – 2020-07-01 (×7): 20 mg via ORAL
  Filled 2020-06-25 (×8): qty 1

## 2020-06-25 MED ORDER — EPLERENONE 25 MG PO TABS
12.5000 mg | ORAL_TABLET | Freq: Every day | ORAL | Status: DC
  Administered 2020-06-25 – 2020-07-01 (×7): 12.5 mg via ORAL
  Filled 2020-06-25 (×8): qty 1

## 2020-06-25 MED ORDER — ACETAMINOPHEN 325 MG PO TABS
650.0000 mg | ORAL_TABLET | Freq: Four times a day (QID) | ORAL | Status: DC | PRN
  Administered 2020-06-28 – 2020-06-30 (×2): 650 mg via ORAL
  Filled 2020-06-25 (×2): qty 2

## 2020-06-25 MED ORDER — NON FORMULARY: 12.5000 mg | Freq: Every day | Status: DC

## 2020-06-25 MED ORDER — MELATONIN 5 MG PO TABS
10.0000 mg | ORAL_TABLET | Freq: Every day | ORAL | Status: DC
  Administered 2020-06-25: 10 mg via ORAL
  Filled 2020-06-25 (×4): qty 2

## 2020-06-25 MED ORDER — MAGNESIUM HYDROXIDE 400 MG/5ML PO SUSP
30.0000 mL | Freq: Every day | ORAL | Status: DC | PRN
  Administered 2020-06-25 – 2020-06-26 (×3): 30 mL via ORAL

## 2020-06-25 MED ORDER — POTASSIUM CHLORIDE CRYS ER 20 MEQ PO TBCR
20.0000 meq | EXTENDED_RELEASE_TABLET | Freq: Every day | ORAL | Status: DC
  Administered 2020-06-25 – 2020-07-01 (×7): 20 meq via ORAL
  Filled 2020-06-25 (×9): qty 1

## 2020-06-25 MED ORDER — BUPROPION HCL ER (XL) 150 MG PO TB24
150.0000 mg | ORAL_TABLET | Freq: Every day | ORAL | Status: DC
  Administered 2020-06-25 – 2020-07-01 (×7): 150 mg via ORAL
  Filled 2020-06-25 (×10): qty 1

## 2020-06-25 NOTE — [facility] Counselor (Signed)
Per Hassie Bruce, RN pt has been accepted to [facility] and assign to room/bed: 506-1. Nursing report: [phone]. Discussed with Dr. Manus Gunning and Yvonna Alanis, RN.    Redmond Pulling, MS, [facility], [name] Triage Specialist [phone].

## 2020-06-25 NOTE — Progress Notes (Signed)
   06/25/20 2015  Psych Admission Type (Psych Patients Only)  Admission Status Voluntary  Psychosocial Assessment  Patient Complaints Depression  Eye Contact Fair  Facial Expression Sad  Affect Sad  Speech Soft  Interaction Assertive  Motor Activity Slow  Appearance/Hygiene Disheveled  Behavior Characteristics Appropriate to situation  Mood Depressed;Anxious  Aggressive Behavior  Effect No apparent injury  Thought Process  Coherency WDL  Content WDL  Delusions WDL  Perception Hallucinations  Hallucination Visual ("I see bugs crawling on the wall")  Judgment Impaired  Confusion WDL  Danger to Self  Current suicidal ideation? Passive  Self-Injurious Behavior Some self-injurious ideation observed or expressed.  No lethal plan expressed   Agreement Not to Harm Self Yes  Description of Agreement verbal contract for safety  Danger to Others  Danger to Others None reported or observed  Patient is currently lying in bed resting. He decided not to attend AA class. Informed of medications for tonight. Safety maintaned with 15 min checks.
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
## 2020-06-25 NOTE — H&P (Addendum)
Psychiatric Admission Assessment Adult  Patient Identification: Ryan Long MRN:  [id number] Date of Evaluation:  06/25/2020 Chief Complaint:  Severe recurrent major depression with psychotic features (HCC) [F33.3] Principal Diagnosis: <principal problem not specified> Diagnosis:  Active Problems:   Severe recurrent major depression with psychotic features (HCC)  History of Present Illness: Patient is seen and examined.  Patient is a 59 year old male with a past psychiatric history significant for cocaine dependence, alcohol dependence, posttraumatic stress disorder and depression who presented to the [facility] emergency department on 06/25/2020 with visual hallucinations and suicidal ideation.  The patient stated that over the last month or so his life had really turned far worse.  He stated that he was unable to control his cocaine and alcohol use.  He denied any previous admissions for detox or rehabilitation.  He stated he had contacted [facility] the day prior to admission and they recommended that he go to the emergency room for detoxification.  He stated that he wanted to go to [facility] because he would be able to have housing there, and then after completing the program would be able to get into some form of a halfway house.  He stated that he was sick and tired of living the drug for life.  He had been living in a boardinghouse in the [place] area, but the population there worsened, there were lots of drugs and alcohol.  Over the last 2 months he has been migrating around the state attempting to find a place that he could avoid drugs and alcohol.  He actually had a hospital admission for his congestive heart failure at [facility], and also had a medical hospitalization in [place].  He stated that his automated defibrillator had gone off twice, and that was because of his cocaine use.  He also stated that his heart failure had worsened due to the cocaine, and he had  to switch from furosemide to Bumex to control his fluid accumulation.  He also stated that the drug use has prevented him from being compliant with his HIV medications.  He stated that he needed to get help now so he could continue to live.  He was admitted to the hospital for evaluation and stabilization.  He does have a significant past medical history for congestive heart failure, coronary artery disease, chronic anticoagulation, HIV disease, COPD, BPH and hypertension.  His psychiatric medications include Wellbutrin XL 150 mg p.o. daily.  He denied previous history of seizures, auditory or visual hallucinations or tremors.  His psychotic symptoms appear to be secondary to the cocaine usage.  Associated Signs/Symptoms: Depression Symptoms:  depressed mood, anhedonia, insomnia, psychomotor agitation, fatigue, feelings of worthlessness/guilt, difficulty concentrating, hopelessness, suicidal thoughts without plan, anxiety, loss of energy/fatigue, disturbed sleep, (Hypo) Manic Symptoms:  Impulsivity, Irritable Mood, Anxiety Symptoms:  Excessive Worry, Psychotic Symptoms:  Delusions, Hallucinations: Visual PTSD Symptoms: Had a traumatic exposure:  As a child Total Time spent with patient: 45 minutes  Past Psychiatric History: Patient denied previous psychiatric admissions.  He denied any previous admissions for detox or for substance rehabilitation as well.  Is the patient at risk to self? No.  Has the patient been a risk to self in the past 6 months? No.  Has the patient been a risk to self within the distant past? No.  Is the patient a risk to others? No.  Has the patient been a risk to others in the past 6 months? No.  Has the patient been a risk  to others within the distant past? No.   Prior Inpatient Therapy:   Prior Outpatient Therapy:    Alcohol Screening: 1. How often do you have a drink containing alcohol?: 2 to 3 times a week 2. How many drinks containing alcohol do you  have on a typical day when you are drinking?: 7, 8, or 9 3. How often do you have six or more drinks on one occasion?: Weekly AUDIT-C Score: 9 4. How often during the last year have you found that you were not able to stop drinking once you had started?: Monthly 5. How often during the last year have you failed to do what was normally expected from you because of drinking?: Monthly 6. How often during the last year have you needed a first drink in the morning to get yourself going after a heavy drinking session?: Never 7. How often during the last year have you had a feeling of guilt of remorse after drinking?: Less than monthly 8. How often during the last year have you been unable to remember what happened the night before because you had been drinking?: Less than monthly 9. Have you or someone else been injured as a result of your drinking?: No 10. Has a relative or friend or a doctor or another health worker been concerned about your drinking or suggested you cut down?: No Alcohol Use Disorder Identification Test Final Score (AUDIT): 15 Alcohol Brief Interventions/Follow-up: Brief Advice Substance Abuse History in the last 12 months:  Yes.   Consequences of Substance Abuse: Medical Consequences:  His cocaine use clearly impacted this admission as well as 2 previous medical admissions with regard to his heart rhythm Family Consequences:  For family support at this point Withdrawal Symptoms:   Cramps Headaches Nausea Tremors Previous Psychotropic Medications: Yes  Psychological Evaluations: No  Past Medical History:  Past Medical History:  Diagnosis Date   Active smoker    AICD (automatic cardioverter/defibrillator) present    Alcohol abuse    Anxiety    AR (allergic rhinitis)    Bipolar 1 disorder (HCC)    CHF (congestive heart failure) (HCC)    Chronic bronchitis (HCC)    Chronic systolic heart failure (HCC)    COPD (chronic obstructive pulmonary disease) (HCC)     Cough 12/31/2018   Crack cocaine use    Depression    Genital herpes    HIV (human immunodeficiency virus infection) (HCC) dx'd 08/1993   HLD (hyperlipidemia)    Hypertension    NICM (nonischemic cardiomyopathy) (HCC)    Echocardiogram 06/28/11: EF 30-35%, mild MR, mild LAE;  No CAD by coronary CT angiogram 3/12 at [facility]   NSVT (nonsustained ventricular tachycardia) (HCC)    PTSD (post-traumatic stress disorder)     Past Surgical History:  Procedure Laterality Date   CARDIAC DEFIBRILLATOR PLACEMENT  01/08/2018   ICD IMPLANT N/A 01/08/2018   Procedure: ICD IMPLANT;  Surgeon: Regan Lemming, MD;  Location: [facility] INVASIVE CV LAB;  Service: Cardiovascular;  Laterality: N/A;   Family History:  Family History  Problem Relation Age of Onset   Glaucoma Mother    Mental illness Mother    Vision loss Mother    Hypertension Father    CAD Father    Mental illness Father    Alcohol abuse Father    Drug abuse Father    Heart attack Father 62   Early death Father    Alcohol abuse Brother    Drug abuse Brother  Drug abuse Brother    Family Psychiatric  History: Noncontributory Tobacco Screening: Have you used any form of tobacco in the last 30 days? (Cigarettes, Smokeless Tobacco, Cigars, and/or Pipes): Yes Tobacco use, Select all that apply: 5 or more cigarettes per day Are you interested in Tobacco Cessation Medications?: Yes, will notify MD for an order Counseled patient on smoking cessation including recognizing danger situations, developing coping skills and basic information about quitting provided: Refused/Declined practical counseling Social History:  Social History   Substance and Sexual Activity  Alcohol Use Not Currently   Alcohol/week: 6.0 standard drinks   Types: 6 Cans of beer per week   Comment: 01/08/2018 "stopped 06/2017"     Social History   Substance and Sexual Activity  Drug Use Not Currently   Types: Cocaine    Comment: 01/08/2018 "stopped 06/2017"    Additional Social History:                           Allergies:  No Known Allergies Lab Results:  Results for orders placed or performed during the hospital encounter of 06/24/20 (from the past 48 hour(s))  SARS Coronavirus 2 by RT PCR (hospital order, performed in [facility] hospital lab) Nasopharyngeal Nasopharyngeal Swab     Status: None   Collection Time: 06/24/20  7:23 PM   Specimen: Nasopharyngeal Swab  Result Value Ref Range   SARS Coronavirus 2 NEGATIVE NEGATIVE    Comment: (NOTE) SARS-CoV-2 target nucleic acids are NOT DETECTED.  The SARS-CoV-2 RNA is generally detectable in upper and lower respiratory specimens during the acute phase of infection. The lowest concentration of SARS-CoV-2 viral copies this assay can detect is 250 copies / mL. A negative result does not preclude SARS-CoV-2 infection and should not be used as the sole basis for treatment or other patient management decisions.  A negative result may occur with improper specimen collection / handling, submission of specimen other than nasopharyngeal swab, presence of viral mutation(s) within the areas targeted by this assay, and inadequate number of viral copies (<250 copies / mL). A negative result must be combined with clinical observations, patient history, and epidemiological information.  Fact Sheet for Patients:   BoilerBrush.com.cy  Fact Sheet for Healthcare Providers: https://pope.com/  This test is not yet approved or  cleared by the [place] FDA and has been authorized for detection and/or diagnosis of SARS-CoV-2 by FDA under an Emergency Use Authorization (EUA).  This EUA will remain in effect (meaning this test can be used) for the duration of the COVID-19 declaration under Section 564(b)(1) of the Act, 21 U.S.C. section 360bbb-3(b)(1), unless the authorization is terminated or revoked  sooner.  Performed at [facility], [address] [place], [place] [address]   Comprehensive metabolic panel     Status: Abnormal   Collection Time: 06/24/20  7:23 PM  Result Value Ref Range   Sodium 140 135 - 145 mmol/L   Potassium 3.7 3.5 - 5.1 mmol/L   Chloride 106 98 - 111 mmol/L   CO2 24 22 - 32 mmol/L   Glucose, Bld 87 70 - 99 mg/dL    Comment: Glucose reference range applies only to samples taken after fasting for at least 8 hours.   BUN 7 6 - 20 mg/dL   Creatinine, Ser 6.75 0.61 - 1.24 mg/dL   Calcium 8.8 (L) 8.9 - 10.3 mg/dL   Total Protein 6.9 6.5 - 8.1 g/dL   Albumin 4.0 3.5 -  5.0 g/dL   AST 28 15 - 41 U/L   ALT 28 0 - 44 U/L   Alkaline Phosphatase 54 38 - 126 U/L   Total Bilirubin 1.7 (H) 0.3 - 1.2 mg/dL   GFR calc non Af Amer >60 >60 mL/min   GFR calc Af Amer >60 >60 mL/min   Anion gap 10 5 - 15    Comment: Performed at [facility], [address], [place], [place] [address]  Ethanol     Status: None   Collection Time: 06/24/20  7:23 PM  Result Value Ref Range   Alcohol, Ethyl (B) <10 <10 mg/dL    Comment: (NOTE) Lowest detectable limit for serum alcohol is 10 mg/dL.  For medical purposes only. Performed at [facility], [address], [place], [place] [address]   Urine rapid drug screen (hosp performed)     Status: Abnormal   Collection Time: 06/24/20  7:23 PM  Result Value Ref Range   Opiates NONE DETECTED NONE DETECTED   Cocaine POSITIVE (A) NONE DETECTED   Benzodiazepines NONE DETECTED NONE DETECTED   Amphetamines NONE DETECTED NONE DETECTED   Tetrahydrocannabinol NONE DETECTED NONE DETECTED   Barbiturates NONE DETECTED NONE DETECTED    Comment: (NOTE) DRUG SCREEN FOR MEDICAL PURPOSES ONLY.  IF CONFIRMATION IS NEEDED FOR ANY PURPOSE, NOTIFY LAB WITHIN 5 DAYS.  LOWEST DETECTABLE LIMITS FOR URINE DRUG SCREEN Drug Class                     Cutoff (ng/mL) Amphetamine and metabolites     1000 Barbiturate and metabolites    200 Benzodiazepine                 200 Tricyclics and metabolites     300 Opiates and metabolites        300 Cocaine and metabolites        300 THC                            50 Performed at [facility], [address] [place], [place] [address]   CBC with Diff     Status: Abnormal   Collection Time: 06/24/20  7:23 PM  Result Value Ref Range   WBC 3.4 (L) 4.0 - 10.5 K/uL   RBC 5.15 4.22 - 5.81 MIL/uL   Hemoglobin 16.5 13.0 - 17.0 g/dL   HCT 52.8 39 - 52 %   MCV 92.8 80.0 - 100.0 fL   MCH 32.0 26.0 - 34.0 pg   MCHC 34.5 30.0 - 36.0 g/dL   RDW 41.3 24.4 - 01.0 %   Platelets 166 150 - 400 K/uL   nRBC 0.0 0.0 - 0.2 %   Neutrophils Relative % 51 %   Neutro Abs 1.7 1.7 - 7.7 K/uL   Lymphocytes Relative 37 %   Lymphs Abs 1.2 0.7 - 4.0 K/uL   Monocytes Relative 10 %   Monocytes Absolute 0.3 0 - 1 K/uL   Eosinophils Relative 1 %   Eosinophils Absolute 0.0 0 - 0 K/uL   Basophils Relative 1 %   Basophils Absolute 0.0 0 - 0 K/uL   Immature Granulocytes 0 %   Abs Immature Granulocytes 0.01 0.00 - 0.07 K/uL    Comment: Performed at [facility], [address] [place], [place] [address]    Blood Alcohol level:  Lab Results  Component Value Date  ETH <10 06/24/2020   ETH <10 02/25/2018    Metabolic Disorder Labs:  Lab Results  Component Value Date   HGBA1C 5.3 09/24/2015   MPG 105 09/24/2015   MPG 105 08/08/2015   Lab Results  Component Value Date   PROLACTIN 32.7 (H) 08/08/2015   Lab Results  Component Value Date   CHOL 139 03/08/2020   TRIG 107 03/08/2020   HDL 37 (L) 03/08/2020   CHOLHDL 3.8 03/08/2020   VLDL 39 (H) 04/18/2016   LDLCALC 82 03/08/2020   LDLCALC 50 04/18/2016    Current Medications: Current Facility-Administered Medications  Medication Dose Route Frequency Provider Last Rate Last Admin   acetaminophen (TYLENOL) tablet 650 mg  650 mg Oral Q6H PRN Nira Conn A, NP        albuterol (VENTOLIN HFA) 108 (90 Base) MCG/ACT inhaler 1-2 puff  1-2 puff Inhalation Q6H PRN Antonieta Pert, MD       alum & mag hydroxide-simeth (MAALOX/MYLANTA) 200-200-20 MG/5ML suspension 30 mL  30 mL Oral Q4H PRN Jackelyn Poling, NP       bictegravir-emtricitabine-tenofovir AF (BIKTARVY) 50-200-25 MG per tablet 1 tablet  1 tablet Oral Daily Antonieta Pert, MD   1 tablet at 06/25/20 0830   bumetanide (BUMEX) tablet 1 mg  1 mg Oral Daily Antonieta Pert, MD       buPROPion (WELLBUTRIN XL) 24 hr tablet 150 mg  150 mg Oral Daily Antonieta Pert, MD   150 mg at 06/25/20 0831   eplerenone (INSPRA) tablet 12.5 mg  12.5 mg Oral Daily Cobos, Rockey Situ, MD       finasteride (PROSCAR) tablet 5 mg  5 mg Oral Daily Antonieta Pert, MD   5 mg at 06/25/20 1610   hydrOXYzine (ATARAX/VISTARIL) tablet 25 mg  25 mg Oral TID PRN Jackelyn Poling, NP       losartan (COZAAR) tablet 25 mg  25 mg Oral Daily Antonieta Pert, MD   25 mg at 06/25/20 9604   magnesium hydroxide (MILK OF MAGNESIA) suspension 30 mL  30 mL Oral Daily PRN Nira Conn A, NP       melatonin tablet 10 mg  10 mg Oral QHS Antonieta Pert, MD       metoprolol succinate (TOPROL-XL) 24 hr tablet 25 mg  25 mg Oral Daily Antonieta Pert, MD   25 mg at 06/25/20 0834   potassium chloride SA (KLOR-CON) CR tablet 20 mEq  20 mEq Oral Daily Antonieta Pert, MD   20 mEq at 06/25/20 0831   rivaroxaban (XARELTO) tablet 20 mg  20 mg Oral Daily Antonieta Pert, MD   20 mg at 06/25/20 5409   tamsulosin (FLOMAX) capsule 0.8 mg  0.8 mg Oral QPC supper Antonieta Pert, MD       traZODone (DESYREL) tablet 100 mg  100 mg Oral QHS PRN Antonieta Pert, MD       valACYclovir (VALTREX) tablet 500 mg  500 mg Oral Daily Antonieta Pert, MD   500 mg at 06/25/20 8119   PTA Medications: Medications Prior to Admission  Medication Sig Dispense Refill Last Dose   bictegravir-emtricitabine-tenofovir AF (BIKTARVY)  50-200-25 MG TABS tablet Take 1 tablet by mouth daily. 30 tablet 11 06/24/2020 at Unknown time   bumetanide (BUMEX) 1 MG tablet Take 1 mg by mouth daily.   Past Week at Unknown time   buPROPion (WELLBUTRIN XL) 150 MG 24 hr tablet Take by  mouth.   Past Week at Unknown time   eplerenone (INSPRA) 25 MG tablet TAKE 1/2 TABLET(12.5 MG) BY MOUTH DAILY (Patient taking differently: Take 12.5 mg by mouth daily. 12.5mg ) 45 tablet 0 06/24/2020 at Unknown time   finasteride (PROSCAR) 5 MG tablet Take 5 mg by mouth daily.   Past Week at Unknown time   hydrOXYzine (VISTARIL) 25 MG capsule Take 1 capsule by mouth daily.   06/24/2020 at Unknown time   losartan (COZAAR) 25 MG tablet Take 25 mg by mouth daily.   Past Week at Unknown time   Melatonin 10 MG TBDP 10 mg.   Past Week at Unknown time   metoprolol succinate (TOPROL-XL) 25 MG 24 hr tablet Take 25 mg by mouth daily.   06/24/2020 at 1800   potassium chloride SA (K-DUR,KLOR-CON) 20 MEQ tablet Take 1 tablet (20 mEq total) by mouth daily. 90 tablet 1 Past Week at Unknown time   rivaroxaban (XARELTO) 20 MG TABS tablet Take 20 mg by mouth daily after supper.   06/24/2020 at 1800   rosuvastatin (CRESTOR) 10 MG tablet Take 20 mg by mouth at bedtime.    06/24/2020 at Unknown time   sildenafil (VIAGRA) 50 MG tablet Take 1 tablet (50 mg total) by mouth daily as needed for erectile dysfunction. At least 24 hours between doses 20 tablet 0 Past Month at Unknown time   tamsulosin (FLOMAX) 0.4 MG CAPS capsule Take 0.8 mg by mouth daily.   Past Week at Unknown time   traZODone (DESYREL) 100 MG tablet Take 100 mg by mouth at bedtime as needed for sleep.   Past Week at Unknown time   valACYclovir (VALTREX) 500 MG tablet Take 1 tablet (500 mg total) by mouth daily. For Herpes. 30 tablet 1 Past Week at Unknown time   VENTOLIN HFA 108 (90 Base) MCG/ACT inhaler INHALE 2 PUFFS INTO THE LUNGS EVERY 6 HOURS AS NEEDED FOR WHEEZING OR SHORTNESS OF BREATH 18 g 3 Past Week at Unknown  time    Musculoskeletal: Strength & Muscle Tone: within normal limits Gait & Station: normal Patient leans: N/A  Psychiatric Specialty Exam: Physical Exam Vitals and nursing note reviewed.  Constitutional:      Appearance: Normal appearance.  HENT:     Head: Normocephalic and atraumatic.  Pulmonary:     Effort: Pulmonary effort is normal.  Neurological:     General: No focal deficit present.     Mental Status: He is alert and oriented to person, place, and time.     Review of Systems  Blood pressure 108/67, pulse 98, temperature 98.5 F (36.9 C), temperature source Oral, resp. rate 18, height  (1.753 m), weight 91.2 kg.Body mass index is 29.68 kg/m.  General Appearance: Casual  Eye Contact:  Minimal  Speech:  Normal Rate  Volume:  Decreased  Mood:  Depressed  Affect:  Congruent  Thought Process:  Coherent and Descriptions of Associations: Intact  Orientation:  Full (Time, Place, and Person)  Thought Content:  Logical  Suicidal Thoughts:  Yes.  without intent/plan  Homicidal Thoughts:  No  Memory:  Immediate;   Fair Recent;   Fair Remote;   Fair  Judgement:  Intact  Insight:  Fair  Psychomotor Activity:  Decreased  Concentration:  Concentration: Fair and Attention Span: Fair  Recall:  Good  Fund of Knowledge:  Good  Language:  Good  Akathisia:  Negative  Handed:  Right  AIMS (if indicated):     Assets:  Desire  for Improvement Resilience  ADL's:  Intact  Cognition:  WNL  Sleep:  Number of Hours: 3    Treatment Plan Summary: Daily contact with patient to assess and evaluate symptoms and progress in treatment, Medication management and Plan : Patient is seen and examined.  Patient is a 59 year old male with the above-stated past medical and psychiatric history who was admitted secondary to suicidal ideation and visual hallucinations.  He will be admitted to the hospital.  He will be integrated in the milieu.  He will be encouraged to attend groups.  We will  restart his medications.  He has been fairly noncompliant with everything most recently.  He denied any suicidal homicidal ideation.  He denied any active auditory or visual hallucinations.  His vital signs are stable, he is afebrile.  He only slept 3 hours last night.  Review of his laboratories from 7/8 revealed essentially normal electrolytes including a creatinine at 1.17.  His white blood cell count was 3.4.  Differential was normal.  Rest of his CBC was normal.  His drug screen was positive for cocaine.  His blood alcohol was less than 10.  His chest x-ray showed mild hyperinflation and bronchial thickening suggesting smoking-related lung disease, asthma or bronchitis.  There was streaky left lung base atelectasis or scarring.  Unfortunately a BNP was not obtained, and I will order that today.  His EKG in the emergency department showed no acute changes, abnormal QTc interval.  I will also plan on transferring him to the [address] [place], and will discussed with social work his request for substance rehabilitation placement.  Observation Level/Precautions:  Detox 15 minute checks  Laboratory:  Chemistry Profile  Psychotherapy:    Medications:    Consultations:    Discharge Concerns:    Estimated LOS:  Other:     Physician Treatment Plan for Primary Diagnosis: <principal problem not specified> Long Term Goal(s): Improvement in symptoms so as ready for discharge  Short Term Goals: Ability to identify changes in lifestyle to reduce recurrence of condition will improve, Ability to verbalize feelings will improve, Ability to disclose and discuss suicidal ideas, Ability to demonstrate self-control will improve, Ability to identify and develop effective coping behaviors will improve, Ability to maintain clinical measurements within normal limits will improve, Compliance with prescribed medications will improve and Ability to identify triggers associated with substance abuse/mental health issues will  improve  Physician Treatment Plan for Secondary Diagnosis: Active Problems:   Severe recurrent major depression with psychotic features (HCC)  Long Term Goal(s): Improvement in symptoms so as ready for discharge  Short Term Goals: Ability to identify changes in lifestyle to reduce recurrence of condition will improve, Ability to verbalize feelings will improve, Ability to disclose and discuss suicidal ideas, Ability to demonstrate self-control will improve, Ability to identify and develop effective coping behaviors will improve, Ability to maintain clinical measurements within normal limits will improve, Compliance with prescribed medications will improve and Ability to identify triggers associated with substance abuse/mental health issues will improve  I certify that inpatient services furnished can reasonably be expected to improve the patient's condition.    Antonieta Pert, MD 7/9/202112:50 PM   Addendum: This dictation records his QTc interval as abnormal, and that is incorrect.  It interpreted my words incorrectly.  The QTC is normal.

## 2020-06-25 NOTE — Progress Notes (Signed)
SPIRITUALITY GROUP NOTE  Pt attended spirituality group facilitated by Wilkie Aye, MDIv, BCC.  Group Description: Group focused on topic of hope. Patients participated in facilitated discussion around topic, connecting with one another around experiences and definitions for hope. Group members engaged with visual explorer photos, reflecting on what hope looks like for them today. Group engaged in discussion around how their definitions of hope are present today in hospital.  Modalities: Psycho-social ed, Adlerian, Narrative, MI  Patient Progress: Ryan Long was present throughout group.  Oriented to topic and engaged with appropriate affect.  Noted that he feels more hopeful that [facility] is a possibility.  He spoke with chaplain about history of attempting to overcome substance use.  States he "always thought I should be able to do it on my own - I didn't want to surrender and let it have control over me."  He spoke with chaplain about reframing this narrative - finding resonance with seeing himself as a sole person fighting a battle against an entire front and now he gets to call in reinforcements.

## 2020-06-25 NOTE — Progress Notes (Signed)
Pt Home medication list is wrong per Pt .  Pt had copy of updated medications from D/C paperwork from 06/10/2020              Medication                                                             dose Albuterol 90 mcg/actuation inhaler 2 puffs PRN Q 6 hrs  Biktarvy 50-200-25 1 tab. daily  Bumetanide 1 mg tab 1 tab daily  Bupropion XL 150 mg 24 hr tab 1 tab daily  eplerenone 25 mg tab 1/2 tab daily  Finasteride 5 mg tab 1 tab daily for BPH  Hydroxyzine pamoate 25 mg capsule 1 capsule BID PRN for Anxiety  Losartan 25 mg tab 1 tab daily  Melatonin 10 mg capsule 10 mg at bedtime  Men 50 plus multivitamin 300-600-300 mcg tab Take by mouth  Metoprolol succinate 25 mg 24 hr tab 1 tab daily  Nicotine 21 mg/ 24 hr 1 patch daily  Potassium chloride ER 20 MEQ 1 tab daily  rivaroxaban 20 mg tab 1 tab daily with dinner  Rosuvastatin 10 mg 1 tab daily at bedtime  Sildenafil 50 mg  1 tab PO QD 1 hr B sexual activity  tamsulosin 0.4 mg capsule 2 capsules daily for BPH  Trazodone 100 mg tab 50 mg by mouth at bedtime  Valacyclovir 500 mg tab 1 tab daily

## 2020-06-25 NOTE — ED Notes (Signed)
Attempted to call report to [facility], No Answer.  °

## 2020-06-25 NOTE — ED Notes (Signed)
[facility] Transport called for pt to be taken to [facility].

## 2020-06-25 NOTE — Tx Team (Addendum)
Interdisciplinary Treatment and Diagnostic Plan Update  06/25/2020 Time of Session: 211p Aubert Choyce MRN: [id number]  Principal Diagnosis: <principal problem not specified>  Secondary Diagnoses: Active Problems:   Severe recurrent major depression with psychotic features (HCC)   Current Medications:  Current Facility-Administered Medications  Medication Dose Route Frequency Provider Last Rate Last Admin   acetaminophen (TYLENOL) tablet 650 mg  650 mg Oral Q6H PRN Lindon Romp A, NP       albuterol (VENTOLIN HFA) 108 (90 Base) MCG/ACT inhaler 1-2 puff  1-2 puff Inhalation Q6H PRN Sharma Covert, MD       alum & mag hydroxide-simeth (MAALOX/MYLANTA) 200-200-20 MG/5ML suspension 30 mL  30 mL Oral Q4H PRN Rozetta Nunnery, NP       bictegravir-emtricitabine-tenofovir AF (BIKTARVY) 50-200-25 MG per tablet 1 tablet  1 tablet Oral Daily Sharma Covert, MD   1 tablet at 06/25/20 0830   bumetanide (BUMEX) tablet 1 mg  1 mg Oral Daily Sharma Covert, MD       buPROPion (WELLBUTRIN XL) 24 hr tablet 150 mg  150 mg Oral Daily Sharma Covert, MD   150 mg at 06/25/20 0831   eplerenone (INSPRA) tablet 12.5 mg  12.5 mg Oral Daily Cobos, Myer Peer, MD       finasteride (PROSCAR) tablet 5 mg  5 mg Oral Daily Sharma Covert, MD   5 mg at 06/25/20 2774   hydrOXYzine (ATARAX/VISTARIL) tablet 25 mg  25 mg Oral TID PRN Rozetta Nunnery, NP       losartan (COZAAR) tablet 25 mg  25 mg Oral Daily Sharma Covert, MD   25 mg at 06/25/20 1287   magnesium hydroxide (MILK OF MAGNESIA) suspension 30 mL  30 mL Oral Daily PRN Lindon Romp A, NP       melatonin tablet 10 mg  10 mg Oral QHS Sharma Covert, MD       metoprolol succinate (TOPROL-XL) 24 hr tablet 25 mg  25 mg Oral Daily Sharma Covert, MD   25 mg at 06/25/20 0834   potassium chloride SA (KLOR-CON) CR tablet 20 mEq  20 mEq Oral Daily Sharma Covert, MD   20 mEq at 06/25/20 0831   rivaroxaban (XARELTO)  tablet 20 mg  20 mg Oral Daily Sharma Covert, MD   20 mg at 06/25/20 8676   tamsulosin (FLOMAX) capsule 0.8 mg  0.8 mg Oral QPC supper Sharma Covert, MD       traZODone (DESYREL) tablet 100 mg  100 mg Oral QHS PRN Sharma Covert, MD       valACYclovir (VALTREX) tablet 500 mg  500 mg Oral Daily Sharma Covert, MD   500 mg at 06/25/20 7209   PTA Medications: Medications Prior to Admission  Medication Sig Dispense Refill Last Dose   bictegravir-emtricitabine-tenofovir AF (BIKTARVY) 50-200-25 MG TABS tablet Take 1 tablet by mouth daily. 30 tablet 11 06/24/2020 at Unknown time   bumetanide (BUMEX) 1 MG tablet Take 1 mg by mouth daily.   Past Week at Unknown time   buPROPion (WELLBUTRIN XL) 150 MG 24 hr tablet Take by mouth.   Past Week at Unknown time   eplerenone (INSPRA) 25 MG tablet TAKE 1/2 TABLET(12.5 MG) BY MOUTH DAILY (Patient taking differently: Take 12.5 mg by mouth daily. 12.32m) 45 tablet 0 06/24/2020 at Unknown time   finasteride (PROSCAR) 5 MG tablet Take 5 mg by mouth daily.   Past Week at  Unknown time   hydrOXYzine (VISTARIL) 25 MG capsule Take 1 capsule by mouth daily.   06/24/2020 at Unknown time   losartan (COZAAR) 25 MG tablet Take 25 mg by mouth daily.   Past Week at Unknown time   Melatonin 10 MG TBDP 10 mg.   Past Week at Unknown time   metoprolol succinate (TOPROL-XL) 25 MG 24 hr tablet Take 25 mg by mouth daily.   06/24/2020 at 1800   potassium chloride SA (K-DUR,KLOR-CON) 20 MEQ tablet Take 1 tablet (20 mEq total) by mouth daily. 90 tablet 1 Past Week at Unknown time   rivaroxaban (XARELTO) 20 MG TABS tablet Take 20 mg by mouth daily after supper.   06/24/2020 at 1800   rosuvastatin (CRESTOR) 10 MG tablet Take 20 mg by mouth at bedtime.    06/24/2020 at Unknown time   sildenafil (VIAGRA) 50 MG tablet Take 1 tablet (50 mg total) by mouth daily as needed for erectile dysfunction. At least 24 hours between doses 20 tablet 0 Past Month at Unknown time    tamsulosin (FLOMAX) 0.4 MG CAPS capsule Take 0.8 mg by mouth daily.   Past Week at Unknown time   traZODone (DESYREL) 100 MG tablet Take 100 mg by mouth at bedtime as needed for sleep.   Past Week at Unknown time   valACYclovir (VALTREX) 500 MG tablet Take 1 tablet (500 mg total) by mouth daily. For Herpes. 30 tablet 1 Past Week at Unknown time   VENTOLIN HFA 108 (90 Base) MCG/ACT inhaler INHALE 2 PUFFS INTO THE LUNGS EVERY 6 HOURS AS NEEDED FOR WHEEZING OR SHORTNESS OF BREATH 18 g 3 Past Week at Unknown time    Patient Stressors: Loss of housing Marital or family conflict Medication change or noncompliance  Patient Strengths: Technical sales engineer for treatment/growth  Treatment Modalities: Medication Management, Group therapy, Case management,  1 to 1 session with clinician, Psychoeducation, Recreational therapy.   Physician Treatment Plan for Primary Diagnosis: <principal problem not specified> Long Term Goal(s): Improvement in symptoms so as ready for discharge Improvement in symptoms so as ready for discharge   Short Term Goals: Ability to identify changes in lifestyle to reduce recurrence of condition will improve Ability to verbalize feelings will improve Ability to disclose and discuss suicidal ideas Ability to demonstrate self-control will improve Ability to identify and develop effective coping behaviors will improve Ability to maintain clinical measurements within normal limits will improve Compliance with prescribed medications will improve Ability to identify triggers associated with substance abuse/mental health issues will improve Ability to identify changes in lifestyle to reduce recurrence of condition will improve Ability to verbalize feelings will improve Ability to disclose and discuss suicidal ideas Ability to demonstrate self-control will improve Ability to identify and develop effective coping behaviors will improve Ability to maintain  clinical measurements within normal limits will improve Compliance with prescribed medications will improve Ability to identify triggers associated with substance abuse/mental health issues will improve  Medication Management: Evaluate patient's response, side effects, and tolerance of medication regimen.  Therapeutic Interventions: 1 to 1 sessions, Unit Group sessions and Medication administration.  Evaluation of Outcomes: Not Met  Physician Treatment Plan for Secondary Diagnosis: Active Problems:   Severe recurrent major depression with psychotic features ([place])  Long Term Goal(s): Improvement in symptoms so as ready for discharge Improvement in symptoms so as ready for discharge   Short Term Goals: Ability to identify changes in lifestyle to reduce recurrence of condition will improve Ability to  verbalize feelings will improve Ability to disclose and discuss suicidal ideas Ability to demonstrate self-control will improve Ability to identify and develop effective coping behaviors will improve Ability to maintain clinical measurements within normal limits will improve Compliance with prescribed medications will improve Ability to identify triggers associated with substance abuse/mental health issues will improve Ability to identify changes in lifestyle to reduce recurrence of condition will improve Ability to verbalize feelings will improve Ability to disclose and discuss suicidal ideas Ability to demonstrate self-control will improve Ability to identify and develop effective coping behaviors will improve Ability to maintain clinical measurements within normal limits will improve Compliance with prescribed medications will improve Ability to identify triggers associated with substance abuse/mental health issues will improve     Medication Management: Evaluate patient's response, side effects, and tolerance of medication regimen.  Therapeutic Interventions: 1 to 1 sessions, Unit  Group sessions and Medication administration.  Evaluation of Outcomes: Not Met   RN Treatment Plan for Primary Diagnosis: <principal problem not specified> Long Term Goal(s): Knowledge of disease and therapeutic regimen to maintain health will improve  Short Term Goals: Ability to remain free from injury will improve, Ability to verbalize frustration and anger appropriately will improve, Ability to demonstrate self-control, Ability to participate in decision making will improve, Ability to verbalize feelings will improve, Ability to disclose and discuss suicidal ideas, Ability to identify and develop effective coping behaviors will improve and Compliance with prescribed medications will improve  Medication Management: RN will administer medications as ordered by provider, will assess and evaluate patient's response and provide education to patient for prescribed medication. RN will report any adverse and/or side effects to prescribing provider.  Therapeutic Interventions: 1 on 1 counseling sessions, Psychoeducation, Medication administration, Evaluate responses to treatment, Monitor vital signs and CBGs as ordered, Perform/monitor CIWA, COWS, AIMS and Fall Risk screenings as ordered, Perform wound care treatments as ordered.  Evaluation of Outcomes: Not Met   LCSW Treatment Plan for Primary Diagnosis: <principal problem not specified> Long Term Goal(s): Safe transition to appropriate next level of care at discharge, Engage patient in therapeutic group addressing interpersonal concerns.  Short Term Goals: Engage patient in aftercare planning with referrals and resources, Increase social support, Increase ability to appropriately verbalize feelings, Increase emotional regulation, Facilitate acceptance of mental health diagnosis and concerns, Facilitate patient progression through stages of change regarding substance use diagnoses and concerns, Identify triggers associated with mental  health/substance abuse issues and Increase skills for wellness and recovery  Therapeutic Interventions: Assess for all discharge needs, 1 to 1 time with Social worker, Explore available resources and support systems, Assess for adequacy in community support network, Educate family and significant other(s) on suicide prevention, Complete Psychosocial Assessment, Interpersonal group therapy.  Evaluation of Outcomes: Not Met   Progress in Treatment: Attending groups: Yes. Participating in groups: Yes. Taking medication as prescribed: Yes. Toleration medication: Yes. Family/Significant other contact made: No, will contact:  family Patient understands diagnosis: Yes. Discussing patient identified problems/goals with staff: Yes. Medical problems stabilized or resolved: Yes. Denies suicidal/homicidal ideation: Yes. Issues/concerns per patient self-inventory: No. Other:   New problem(s) identified: No, Describe:  no new problems  New Short Term/Long Term Goal(s):  Patient Goals:  Go to [facility]  Discharge Plan or Barriers:   Reason for Continuation of Hospitalization: Medication stabilization Withdrawal symptoms  Estimated Length of Stay:  Attendees: Patient: Priscilla Finklea. [place] 06/25/2020 2:29 PM  Physician: Mallie Darting 06/25/2020 2:29 PM  Nursing:  06/25/2020 2:29 PM  RN Care Manager: 06/25/2020  2:29 PM  Social Worker: Macon Large 06/25/2020 2:29 PM  Recreational Therapist:  06/25/2020 2:29 PM  Other:  06/25/2020 2:29 PM  Other:  06/25/2020 2:29 PM  Other: 06/25/2020 2:29 PM    Scribe for Treatment Team: Bethann Berkshire, LCSW 06/25/2020 2:29 PM

## 2020-06-25 NOTE — ED Notes (Signed)
Pt accepted at [facility] Room 506, Bed 1. Attempted to call report at [phone], No Answer.

## 2020-06-25 NOTE — ED Notes (Signed)
Attempted to call report to [facility], No Answer.
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
## 2020-06-25 NOTE — Progress Notes (Signed)
   06/25/20 0935  Psych Admission Type (Psych Patients Only)  Admission Status Voluntary  Psychosocial Assessment  Patient Complaints Depression;Sleep disturbance  Eye Contact Fair  Facial Expression Sad  Affect Sad  Speech Soft  Interaction Assertive  Motor Activity Slow  Appearance/Hygiene Disheveled  Behavior Characteristics Cooperative  Mood Depressed;Sad;Preoccupied  Aggressive Behavior  Effect No apparent injury  Thought Process  Coherency WDL  Content WDL  Delusions WDL  Perception Hallucinations  Hallucination Visual ("I see bugs crawling on the wall")  Judgment Impaired  Confusion WDL  Danger to Self  Current suicidal ideation? Passive  Self-Injurious Behavior Some self-injurious ideation observed or expressed.  No lethal plan expressed   Agreement Not to Harm Self Yes  Description of Agreement verbal contract for safety  Danger to Others  Danger to Others None reported or observed

## 2020-06-25 NOTE — ED Notes (Signed)
Consent to treatment at [facility] given to [employer] to take to [facility].
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
## 2020-06-25 NOTE — ED Notes (Signed)
Pt signed consent form for voluntary admission and treatment at [facility].  Report given to [facility] at [facility].

## 2020-06-25 NOTE — Progress Notes (Signed)
°   06/25/20 2015  COVID-19 Daily Checkoff  Have you had a fever (temp > 37.80C/100F)  in the past 24 hours?  No  If you have had runny nose, nasal congestion, sneezing in the past 24 hours, has it worsened? No  COVID-19 EXPOSURE  Have you traveled outside the state in the past 14 days? No  Have you been in contact with someone with a confirmed diagnosis of COVID-19 or PUI in the past 14 days without wearing appropriate PPE? No  Have you been living in the same home as a person with confirmed diagnosis of COVID-19 or a PUI (household contact)? No  Have you been diagnosed with COVID-19? No

## 2020-06-25 NOTE — Tx Team (Signed)
Initial Treatment Plan 06/25/2020 4:29 AM [name] [facility]:[id number]    PATIENT STRESSORS: Loss of housing Marital or family conflict Medication change or noncompliance   PATIENT STRENGTHS: General fund of knowledge Motivation for treatment/growth   PATIENT IDENTIFIED PROBLEMS:  risk for suicide  SA  "staying clean and sober, placement in an [place] house after 21 day rehab"                 DISCHARGE CRITERIA:  Adequate post-discharge living arrangements Improved stabilization in mood, thinking, and/or behavior Verbal commitment to aftercare and medication compliance  PRELIMINARY DISCHARGE PLAN: Attend aftercare/continuing care group Outpatient therapy  PATIENT/FAMILY INVOLVEMENT: This treatment plan has been presented to and reviewed with the patient, Ryan Long.  The patient and family have been given the opportunity to ask questions and make suggestions.  Delos Haring, RN 06/25/2020, 4:29 AM

## 2020-06-26 LAB — BRAIN NATRIURETIC PEPTIDE: B Natriuretic Peptide: 179.8 pg/mL — ABNORMAL HIGH (ref 0.0–100.0)

## 2020-06-26 MED ORDER — WHITE PETROLATUM EX OINT
TOPICAL_OINTMENT | CUTANEOUS | Status: AC
  Filled 2020-06-26: qty 10

## 2020-06-26 MED ORDER — MELATONIN 5 MG PO TABS
5.0000 mg | ORAL_TABLET | Freq: Every day | ORAL | Status: DC
  Administered 2020-06-26 – 2020-06-28 (×3): 5 mg via ORAL
  Filled 2020-06-26 (×4): qty 1

## 2020-06-26 MED ORDER — NICOTINE 14 MG/24HR TD PT24
MEDICATED_PATCH | TRANSDERMAL | Status: AC
  Filled 2020-06-26: qty 1

## 2020-06-26 MED ORDER — NICOTINE 14 MG/24HR TD PT24
14.0000 mg | MEDICATED_PATCH | Freq: Every day | TRANSDERMAL | Status: DC
  Administered 2020-06-26 – 2020-07-01 (×6): 14 mg via TRANSDERMAL
  Filled 2020-06-26 (×7): qty 1

## 2020-06-26 MED ORDER — TRAZODONE HCL 50 MG PO TABS
50.0000 mg | ORAL_TABLET | Freq: Once | ORAL | Status: AC
  Administered 2020-06-26: 50 mg via ORAL
  Filled 2020-06-26: qty 1

## 2020-06-26 MED ORDER — TRAZODONE HCL 50 MG PO TABS
50.0000 mg | ORAL_TABLET | Freq: Every evening | ORAL | Status: DC | PRN
  Administered 2020-06-26 – 2020-06-27 (×3): 50 mg via ORAL
  Filled 2020-06-26 (×4): qty 1

## 2020-06-26 NOTE — [facility] Group Notes (Signed)
Adult Psychoeducational Group Note  Date:  06/26/2020 Time:  2:57 PM  Group Topic/Focus:  Identifying Needs:   The focus of this group is to help patients identify their personal needs that have been historically problematic and identify healthy behaviors to address their needs.  Participation Level:  Active  Participation Quality:  Appropriate  Affect:  Blunted and Flat  Cognitive:  Appropriate  Insight: Improving  Engagement in Group:  Engaged  Modes of Intervention:  Discussion, Education and Role-play  Additional Comments:  Pt tearful at the end. Stating he was here not too long ago and relapsed.  Dione Housekeeper 06/26/2020, 2:57 PM

## 2020-06-26 NOTE — Progress Notes (Signed)
   06/26/20 2115  COVID-19 Daily Checkoff  Have you had a fever (temp > 37.80C/100F)  in the past 24 hours?  No  If you have had runny nose, nasal congestion, sneezing in the past 24 hours, has it worsened? No  COVID-19 EXPOSURE  Have you traveled outside the state in the past 14 days? No  Have you been in contact with someone with a confirmed diagnosis of COVID-19 or PUI in the past 14 days without wearing appropriate PPE? No  Have you been living in the same home as a person with confirmed diagnosis of COVID-19 or a PUI (household contact)? No  Have you been diagnosed with COVID-19? No

## 2020-06-26 NOTE — Progress Notes (Signed)
Patient up in the dayroom a little more tonight. He was compliant with his medications but concerned he would not sleep well since his sleep medicines were decreased in dosage.    06/26/20 2115  Psych Admission Type (Psych Patients Only)  Admission Status Voluntary  Psychosocial Assessment  Patient Complaints Depression  Eye Contact Fair  Facial Expression Sad  Affect Depressed  Speech Logical/coherent  Interaction No initiation;Minimal  Motor Activity Slow  Appearance/Hygiene Disheveled;In hospital gown  Behavior Characteristics Appropriate to situation;Cooperative  Mood Depressed;Pleasant  Aggressive Behavior  Effect No apparent injury  Thought Process  Coherency WDL  Content WDL  Delusions None reported or observed  Perception WDL  Hallucination None reported or observed  Judgment Impaired  Confusion None  Danger to Self  Current suicidal ideation? Passive  Self-Injurious Behavior Some self-injurious ideation observed or expressed.  No lethal plan expressed   Agreement Not to Harm Self Yes  Description of Agreement verbal contract for safety  Danger to Others  Danger to Others None reported or observed
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
## 2020-06-26 NOTE — [facility] Group Notes (Signed)
Adult Psychoeducational Group Note  Date:  06/26/2020 Time:  10:35 AM  Group Topic/Focus:  Goals Group:   The focus of this group is to help patients establish daily goals to achieve during treatment and discuss how the patient can incorporate goal setting into their daily lives to aide in recovery.  Participation Level:  Did Not Attend   Dione Housekeeper 06/26/2020, 10:35 AM

## 2020-06-26 NOTE — Progress Notes (Signed)
Pt endorses passive suicidal thoughts with no plan or intent. Pt verbally contracts for safety. Pt reported feeling depressed but would not provide any additional information. He appears withdrawn and is isolative to his room.   Orders reviewed with pt. V/s assessed. Verbal support provided. 15 minute checks performed for safety.   Pt compliant with taking meds and denies any medication side effects.    06/26/20 1000  Psych Admission Type (Psych Patients Only)  Admission Status Voluntary  Psychosocial Assessment  Patient Complaints Self-harm thoughts;Depression  Eye Contact Fair  Facial Expression Sad  Affect Depressed  Speech Logical/coherent  Interaction Forwards little  Motor Activity Slow  Appearance/Hygiene Disheveled  Behavior Characteristics Appropriate to situation  Mood Depressed  Aggressive Behavior  Effect No apparent injury  Thought Process  Coherency WDL  Content WDL  Delusions None reported or observed  Perception WDL  Hallucination None reported or observed  Judgment Impaired  Confusion None  Danger to Self  Current suicidal ideation? Passive  Self-Injurious Behavior Some self-injurious ideation observed or expressed.  No lethal plan expressed   Agreement Not to Harm Self Yes  Description of Agreement verbal contract for safety  Danger to Others  Danger to Others None reported or observed

## 2020-06-27 MED ORDER — HYDROXYZINE HCL 25 MG PO TABS
ORAL_TABLET | ORAL | Status: AC
  Filled 2020-06-27: qty 1

## 2020-06-27 MED ORDER — HYDROXYZINE HCL 25 MG PO TABS
25.0000 mg | ORAL_TABLET | Freq: Once | ORAL | Status: AC
  Administered 2020-06-27: 25 mg via ORAL
  Filled 2020-06-27: qty 1

## 2020-06-27 MED ORDER — BUMETANIDE 2 MG PO TABS
2.0000 mg | ORAL_TABLET | Freq: Every day | ORAL | Status: DC
  Filled 2020-06-27 (×4): qty 1

## 2020-06-27 MED ORDER — TRAZODONE HCL 50 MG PO TABS
50.0000 mg | ORAL_TABLET | Freq: Once | ORAL | Status: AC
  Administered 2020-06-27: 50 mg via ORAL
  Filled 2020-06-27: qty 1

## 2020-06-27 MED ORDER — WHITE PETROLATUM EX OINT
TOPICAL_OINTMENT | CUTANEOUS | Status: AC
  Administered 2020-06-27: 2 via CUTANEOUS
  Filled 2020-06-27: qty 10

## 2020-06-27 NOTE — [facility] Suicide Risk Assessment (Signed)
[facility] INPATIENT:  Family/Significant Other Suicide Prevention Education  Suicide Prevention Education:  Patient Refusal for Family/Significant Other Suicide Prevention Education: The patient Ryan Long has refused to provide written consent for family/significant other to be provided Family/Significant Other Suicide Prevention Education during admission and/or prior to discharge.  Physician notified.  Carloyn Jaeger Grossman-Orr 06/27/2020, 10:55 AM

## 2020-06-27 NOTE — Progress Notes (Signed)
[employer] NOVEL CORONAVIRUS (COVID-19) DAILY CHECK-OFF SYMPTOMS - answer yes or no to each - every day NO YES  Have you had a fever in the past 24 hours?  . Fever (Temp > 37.80C / 100F) X   Have you had any of these symptoms in the past 24 hours? . New Cough .  Sore Throat  .  Shortness of Breath .  Difficulty Breathing .  Unexplained Body Aches   X   Have you had any one of these symptoms in the past 24 hours not related to allergies?   . Runny Nose .  Nasal Congestion .  Sneezing   X   If you have had runny nose, nasal congestion, sneezing in the past 24 hours, has it worsened?  X   EXPOSURES - check yes or no X   Have you traveled outside the state in the past 14 days?  X   Have you been in contact with someone with a confirmed diagnosis of COVID-19 or PUI in the past 14 days without wearing appropriate PPE?  X   Have you been living in the same home as a person with confirmed diagnosis of COVID-19 or a PUI (household contact)?    X   Have you been diagnosed with COVID-19?    X              What to do next: Answered NO to all: Answered YES to anything:   Proceed with unit schedule Follow the [facility] Inpatient Flowsheet.   Ryan Long K. Cammeron Greis MSN, RN, [facility] [phone] 

## 2020-06-27 NOTE — Progress Notes (Signed)
[facility] MD Progress Note  06/27/2020 2:36 PM Ryan Long  MRN:  [id number] Subjective: Today patient reports he is feeling better than he did on admission.  Currently denies medication side effects.  He presents future oriented and is expressing interest in going to rehab at discharge, specifically mentioning [facility] as his preference .  Currently denies suicidal ideations. Objective : I discussed case with treatment team and have met with patient 59 year old male, currently homeless, presented to hospital reporting worsening depression, suicidal ideations, visual hallucinations, alcohol and cocaine abuse.  Reported last alcohol consumption at 4 days prior to admission and 7/8 BAL was negative.  UDS was positive for cocaine.  History of medical illnesses to include CHF, pacemaker/automatic defibrillator, HIV positive.  Today patient presents alert, attentive, calm , polite and cooperative on approach. No disruptive or agitated behaviors on unit.  He has been more visible in dayroom. He endorses improving mood although still feeling depressed.  Denies SI today.  Contracts for safety.  Reports some lingering visual hallucinations but states "they are much better".  Currently does not appear internally preoccupied.  No delusions are currently expressed. Labs reviewed 7/9 lipid panel unremarkable, hemoglobin A1c A1c 5.7, TSH is decreased at 0.245, BNP is elevated at 179.8 . Currently patient denies dyspnea or orthopnea and does not appear to be in any acute distress. Pulse rate 98, BP 110/75.  Not presenting with symptoms of alcohol withdrawal at this time.  No tremors, no diaphoresis, no restlessness or psychomotor agitation. He is on Wellbutrin XL which she is tolerating well.  He reports he has been on this medication in the past with good response.  Denies side effects.  He has denied history of seizures.   Principal Problem: Cocaine/alcohol use disorder, substance-induced mood disorder versus MDD  with psychotic symptoms. Diagnosis: Active Problems:   Severe recurrent major depression with psychotic features ([place])  Total Time spent with patient: 20 minutes   Past Psychiatric History: Substance Abuse Disorder, Alcohol Abuse Disorder  Past Medical History:  Past Medical History:  Diagnosis Date  . Active smoker   . AICD (automatic cardioverter/defibrillator) present   . Alcohol abuse   . Anxiety   . AR (allergic rhinitis)   . Bipolar 1 disorder ([place])   . CHF (congestive heart failure) ([place])   . Chronic bronchitis ([place])   . Chronic systolic heart failure ([place])   . COPD (chronic obstructive pulmonary disease) ([place])   . Cough 12/31/2018  . Crack cocaine use   . Depression   . Genital herpes   . HIV (human immunodeficiency virus infection) ([place]) dx'd 08/1993  . HLD (hyperlipidemia)   . Hypertension   . NICM (nonischemic cardiomyopathy) ([place])    Echocardiogram 06/28/11: EF 30-35%, mild MR, mild LAE;  No CAD by coronary CT angiogram 3/12 at [facility]  . NSVT (nonsustained ventricular tachycardia) ([place])   . PTSD (post-traumatic stress disorder)     Past Surgical History:  Procedure Laterality Date  . CARDIAC DEFIBRILLATOR PLACEMENT  01/08/2018  . ICD IMPLANT N/A 01/08/2018   Procedure: ICD IMPLANT;  Surgeon: Constance Haw, MD;  Location: [facility];  Service: Cardiovascular;  Laterality: N/A;   Family History:  Family History  Problem Relation Age of Onset  . Glaucoma Mother   . Mental illness Mother   . Vision loss Mother   . Hypertension Father   . CAD Father   . Mental illness Father   . Alcohol abuse Father   .  Drug abuse Father   . Heart attack Father 59  . Early death Father   . Alcohol abuse Brother   . Drug abuse Brother   . Drug abuse Brother    Family Psychiatric  History: Declined to provide information about family Social History:  Social History   Substance and Sexual Activity  Alcohol Use Not Currently  .  Alcohol/week: 6.0 standard drinks  . Types: 6 Cans of beer per week   Comment: 01/08/2018 "stopped 06/2017"     Social History   Substance and Sexual Activity  Drug Use Not Currently  . Types: Cocaine   Comment: 01/08/2018 "stopped 06/2017"    Social History   Socioeconomic History  . Marital status: Single    Spouse name: Not on file  . Number of children: Not on file  . Years of education: Not on file  . Highest education level: Not on file  Occupational History  . Not on file  Tobacco Use  . Smoking status: Current Every Day Smoker    Packs/day: 0.75    Years: 38.00    Pack years: 28.50    Types: Cigarettes  . Smokeless tobacco: Never Used  . Tobacco comment: hoping welbutrin will help  Vaping Use  . Vaping Use: Never used  Substance and Sexual Activity  . Alcohol use: Not Currently    Alcohol/week: 6.0 standard drinks    Types: 6 Cans of beer per week    Comment: 01/08/2018 "stopped 06/2017"  . Drug use: Not Currently    Types: Cocaine    Comment: 01/08/2018 "stopped 06/2017"  . Sexual activity: Yes    Birth control/protection: Condom    Comment: pt. declined condoms  Other Topics Concern  . Not on file  Social History Narrative  . Not on file   Social Determinants of Health   Financial Resource Strain:   . Difficulty of Paying Living Expenses:   Food Insecurity:   . Worried About Charity fundraiser in the Last Year:   . Arboriculturist in the Last Year:   Transportation Needs:   . Film/video editor (Medical):   Marland Kitchen Lack of Transportation (Non-Medical):   Physical Activity:   . Days of Exercise per Week:   . Minutes of Exercise per Session:   Stress:   . Feeling of Stress :   Social Connections:   . Frequency of Communication with Friends and Family:   . Frequency of Social Gatherings with Friends and Family:   . Attends Religious Services:   . Active Member of Clubs or Organizations:   . Attends Archivist Meetings:   Marland Kitchen Marital Status:     Additional Social History:   Sleep: Improving  Appetite:  Fair/improving  Current Medications: Current Facility-Administered Medications  Medication Dose Route Frequency Provider Last Rate Last Admin  . acetaminophen (TYLENOL) tablet 650 mg  650 mg Oral Q6H PRN Lindon Romp A, NP      . albuterol (VENTOLIN HFA) 108 (90 Base) MCG/ACT inhaler 1-2 puff  1-2 puff Inhalation Q6H PRN Sharma Covert, MD      . alum & mag hydroxide-simeth (MAALOX/MYLANTA) 200-200-20 MG/5ML suspension 30 mL  30 mL Oral Q4H PRN Lindon Romp A, NP      . bictegravir-emtricitabine-tenofovir AF (BIKTARVY) 50-200-25 MG per tablet 1 tablet  1 tablet Oral Daily Sharma Covert, MD   1 tablet at 06/27/20 0957  . bumetanide (BUMEX) tablet 1 mg  1 mg Oral Daily  Sharma Covert, MD      . buPROPion (WELLBUTRIN XL) 24 hr tablet 150 mg  150 mg Oral Daily Sharma Covert, MD   150 mg at 06/27/20 1000  . eplerenone (INSPRA) tablet 12.5 mg  12.5 mg Oral Daily Jayel Inks, Myer Peer, MD   12.5 mg at 06/27/20 1000  . finasteride (PROSCAR) tablet 5 mg  5 mg Oral Daily Sharma Covert, MD   5 mg at 06/27/20 0958  . hydrOXYzine (ATARAX/VISTARIL) tablet 25 mg  25 mg Oral TID PRN Rozetta Nunnery, NP   25 mg at 06/26/20 2114  . losartan (COZAAR) tablet 25 mg  25 mg Oral Daily Sharma Covert, MD   25 mg at 06/27/20 0957  . magnesium hydroxide (MILK OF MAGNESIA) suspension 30 mL  30 mL Oral Daily PRN Lindon Romp A, NP   30 mL at 06/26/20 2122  . melatonin tablet 5 mg  5 mg Oral QHS Quinnetta Roepke, Myer Peer, MD   5 mg at 06/26/20 2114  . metoprolol succinate (TOPROL-XL) 24 hr tablet 25 mg  25 mg Oral Daily Sharma Covert, MD   25 mg at 06/27/20 0956  . nicotine (NICODERM CQ - dosed in mg/24 hours) patch 14 mg  14 mg Transdermal Daily Tyiana Hill, Myer Peer, MD   14 mg at 06/27/20 0953  . potassium chloride SA (KLOR-CON) CR tablet 20 mEq  20 mEq Oral Daily Sharma Covert, MD   20 mEq at 06/27/20 0959  . rivaroxaban (XARELTO)  tablet 20 mg  20 mg Oral Daily Sharma Covert, MD   20 mg at 06/27/20 0956  . tamsulosin (FLOMAX) capsule 0.8 mg  0.8 mg Oral QPC supper Sharma Covert, MD   0.8 mg at 06/26/20 1739  . traZODone (DESYREL) tablet 50 mg  50 mg Oral QHS PRN Gila Lauf, Myer Peer, MD   50 mg at 06/26/20 2307  . valACYclovir (VALTREX) tablet 500 mg  500 mg Oral Daily Sharma Covert, MD   500 mg at 06/27/20 6578    Lab Results:  Results for orders placed or performed during the hospital encounter of 06/25/20 (from the past 48 hour(s))  Hemoglobin A1c     Status: Abnormal   Collection Time: 06/25/20  5:55 PM  Result Value Ref Range   Hgb A1c MFr Bld 5.7 (H) 4.8 - 5.6 %    Comment: (NOTE) Pre diabetes:          5.7%-6.4%  Diabetes:              >6.4%  Glycemic control for   <7.0% adults with diabetes    Mean Plasma Glucose 116.89 mg/dL    Comment: Performed at [facility], [place] [address], [place], [place] [address]  Lipid panel     Status: Abnormal   Collection Time: 06/25/20  5:55 PM  Result Value Ref Range   Cholesterol 125 0 - 200 mg/dL   Triglycerides 156 (H) <150 mg/dL   HDL 51 >40 mg/dL   Total CHOL/HDL Ratio 2.5 RATIO   VLDL 31 0 - 40 mg/dL   LDL Cholesterol 43 0 - 99 mg/dL    Comment:        Total Cholesterol/HDL:CHD Risk Coronary Heart Disease Risk Table                     Men   Women  1/2 Average Risk   3.4   3.3  Average Risk  5.0   4.4  2 X Average Risk   9.6   7.1  3 X Average Risk  23.4   11.0        Use the calculated Patient Ratio above and the CHD Risk Table to determine the patient's CHD Risk.        ATP III CLASSIFICATION (LDL):  <100     mg/dL   Optimal  100-129  mg/dL   Near or Above                    Optimal  130-159  mg/dL   Borderline  160-189  mg/dL   High  >190     mg/dL   Very High Performed at [place] [address], [place], [place] [address]   TSH     Status: Abnormal   Collection Time: 06/25/20  5:55 PM   Result Value Ref Range   TSH 0.245 (L) 0.350 - 4.500 uIU/mL    Comment: Performed by a 3rd Generation assay with a functional sensitivity of <=0.01 uIU/mL. Performed at [facility], [place] [address], [place], [place] [address]   Brain natriuretic peptide     Status: Abnormal   Collection Time: 06/26/20  5:37 PM  Result Value Ref Range   B Natriuretic Peptide 179.8 (H) 0.0 - 100.0 pg/mL    Comment: Performed at [facility], [place] [address], [place], [place] [address]    Blood Alcohol level:  Lab Results  Component Value Date   ETH <10 06/24/2020   ETH <10 29/51/8841    Metabolic Disorder Labs: Lab Results  Component Value Date   HGBA1C 5.7 (H) 06/25/2020   MPG 116.89 06/25/2020   MPG 105 09/24/2015   Lab Results  Component Value Date   PROLACTIN 32.7 (H) 08/08/2015   Lab Results  Component Value Date   CHOL 125 06/25/2020   TRIG 156 (H) 06/25/2020   HDL 51 06/25/2020   CHOLHDL 2.5 06/25/2020   VLDL 31 06/25/2020   LDLCALC 43 06/25/2020   LDLCALC 82 03/08/2020    Physical Findings: AIMS: Facial and Oral Movements Muscles of Facial Expression: None, normal Lips and Perioral Area: None, normal Jaw: None, normal Tongue: None, normal,Extremity Movements Upper (arms, wrists, hands, fingers): None, normal Lower (legs, knees, ankles, toes): None, normal, Trunk Movements Neck, shoulders, hips: None, normal, Overall Severity Severity of abnormal movements (highest score from questions above): None, normal Incapacitation due to abnormal movements: None, normal Patient's awareness of abnormal movements (rate only patient's report): No Awareness, Dental Status Current problems with teeth and/or dentures?: No Does patient usually wear dentures?: No  CIWA:  CIWA-Ar Total: 0 COWS:  COWS Total Score: 1  Musculoskeletal: Strength & Muscle Tone: within normal limits-currently patient is not presenting with significant symptoms of  withdrawal, no tremors or diaphoresis are noted, does not appear to be in any acute distress. Gait & Station: normal Patient leans: N/A  Psychiatric Specialty Exam: Physical Exam  Review of Systems denies dyspnea at room air, no orthopnea denies chest pain, no vomiting  Blood pressure 110/75, pulse 98, temperature 98.2 F (36.8 C), temperature source Oral, resp. rate 20, height '5\' 9"'$  (1.753 m), weight 91.2 kg, SpO2 99 %.Body mass index is 29.68 kg/m.  General Appearance: Casual  Eye Contact:  Good  Speech:  Improved speech  Volume:  Decreased  Mood:  Acknowledges improving mood  Affect:  Remains constricted but tends to improve during session, smiles  briefly at times appropriately  Thought Process:  Linear and Descriptions of Associations: Intact  Orientation:  Other:  Presents alert and attentive  Thought Content:  Reports improving but not completely resolved hallucinations.  Does not appear internally preoccupied at this time, no delusions are expressed.  Suicidal Thoughts:  No currently denies suicidal plan or intention and contracts for safety on unit, presents future oriented at this time  Homicidal Thoughts:  No  Memory:  Recent and remote grossly intact  Judgement:  Fair/improving  Insight:  Fair  Psychomotor Activity:  Decreased-no tremors, no diaphoresis, no restlessness or psychomotor agitation  Concentration:  Concentration: Improving and Attention Span: Improving  Recall:  Good  Fund of Knowledge:  Good  Language:  Good  Akathisia:  No  Handed:  Right  AIMS (if indicated):     Assets:  Desire for Improvement Resilience  ADL's:  Intact  Cognition:  WNL  Sleep:  Number of Hours: 5.5   Assessment: 59 year old male, currently homeless, presented to hospital reporting worsening depression, suicidal ideations, visual hallucinations, alcohol and cocaine abuse.  Reported last alcohol consumption at 4 days prior to admission and 7/8 BAL was negative.  UDS was positive for  cocaine.  History of medical illnesses to include CHF, pacemaker/automatic defibrillator, HIV positive.  Today patient presents alert, attentive, calm, without acute agitation or distress and without symptoms of withdrawal.  He reports ongoing depression but acknowledges feeling better, currently denies SI and presents more future oriented, expressing interest in going to a rehab at discharge.  Reports some lingering hallucinations although states they are improved, currently does not appear internally preoccupied.  He is tolerating medications well, does not appear to be in any acute distress, lying comfortably in bed, BNP is elevated.  TSH low. He is not currently on a standing antipsychotic medication.  As noted he reports improving/resolving psychotic symptoms and does not appear internally preoccupied.  Will not initiate antipsychotic at this time  Treatment Plan Summary  Encourage group and milieu participation Encourage efforts to work on sobriety and relapse prevention Patient is expressing interest in going to a rehab at discharge (specifically interested in going to [facility]). Continue Wellbutrin XL 150 mg daily for depression Continue Hydroxyzine 25 mg TID PRN for Anxiety. Continue Trazodone 50 mg QHS as needed for insomnia Continue melatonin 5 mg nightly for insomnia Continue Proscar 5 mg daily, Inspra 12.5 mg daily, Cozaar 25 mg daily, Bumex 1 mg daily, Xarelto 20 mg daily for history of CHF/cardiac disease. Continue Biktarvy 1 tablet daily for HIV I have consulted hospitalist for assistance with management of CHF/hypertension FT3, FT4 ordered to follow-up on low TSH.  Currently pending.  Jenne Campus, MD 06/27/2020, 2:36 PM   Patient ID: Dareen Piano, male   DOB: Oct 07, 1961, 59 y.o.   MRN: [id number]
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
## 2020-06-27 NOTE — Progress Notes (Signed)
   06/27/20 2045  COVID-19 Daily Checkoff  Have you had a fever (temp > 37.80C/100F)  in the past 24 hours?  No  If you have had runny nose, nasal congestion, sneezing in the past 24 hours, has it worsened? No  COVID-19 EXPOSURE  Have you traveled outside the state in the past 14 days? No  Have you been in contact with someone with a confirmed diagnosis of COVID-19 or PUI in the past 14 days without wearing appropriate PPE? No  Have you been living in the same home as a person with confirmed diagnosis of COVID-19 or a PUI (household contact)? No  Have you been diagnosed with COVID-19? No

## 2020-06-27 NOTE — BHH Group Notes (Signed)
LCSW Group Therapy 06/26/20 1:40pm   Type of Therapy and Topic:  Group Therapy:  Setting Goals   Participation Level: did not attend   Description of Group: In this process group, patients discussed using strengths to work toward goals and address challenges.  Patients identified two positive things about themselves and one goal they were working on.  Patients were given the opportunity to share openly and support each other's plan for self-empowerment.  The group discussed the value of gratitude and were encouraged to have a daily reflection of positive characteristics or circumstances.  Patients were encouraged to identify a plan to utilize their strengths to work on current challenges and goals.   Therapeutic Goals 1. Patient will verbalize personal strengths/positive qualities and relate how these can assist with achieving desired personal goals 2. Patients will verbalize affirmation of peers plans for personal change and goal setting 3. Patients will explore the value of gratitude and positive focus as related to successful achievement of goals 4. Patients will verbalize a plan for regular reinforcement of personal positive qualities and circumstances.   Summary of Patient Progress:  patient did not attend.      Therapeutic Modalities Cognitive Behavioral Therapy Motivational Interviewing

## 2020-06-27 NOTE — Progress Notes (Signed)
Patient observed up in the dayroom watching tv with no interaction with other peers. Writer spoke with him 1:1 and he reports his day being better. He did inquire about his sleep medication and reported that the doctor told him he would increase his trazodone back to 100 mg. Writer later had to inform him during medication pass that his trazodone was not increased. Patient reported that he was not going to be able to sleep on 50 mg. Writer obtained a 1 x order for trazodone 50 mg and vistaril 25 mg. Patient had already requested ear plugs b/c his roommate snores. He was pleased once he received his additional doses. Safety maintained with 15 min checks.

## 2020-06-27 NOTE — Consult Note (Signed)
[facility] Medical Consultation  Brandy Kabat ZOX:[id number] DOB: 07/15/1961 DOA: 06/25/2020 PCP: Cain Saupe, MD   Requesting physician: [place] Date of consultation: 06/27/2020 Reason for consultation: Review of medications/phone consult only  Impression/Recommendations Active Problems:   Human immunodeficiency virus (HIV) disease (HCC)   Recurrent HSV (herpes simplex virus)   Hyperlipidemia   Chronic systolic heart failure (HCC)   Tobacco use disorder   Hypertension   Chronic obstructive pulmonary disease (HCC)   Abnormal thyroid stimulating hormone (TSH) level   AICD (automatic cardioverter/defibrillator) present   Severe recurrent major depression with psychotic features (HCC)   HIV Most recent CD4 count is 537 Continue Biktarvy at current doses  Recurrent HSV Continue Valtrex  Hyperlipidemia Low-cholesterol diet  Chronic systolic heart failure Last EF was 25 to 30% in 2018 with grade 1 diastolic dysfunction Ongoing cocaine use is probably not helping Continue Xarelto, Toprol, Cozaar, Inspra Will increase his Bumex x1 due to elevated BNP Please change back to 1 mg dosing following one 2 mg dose. Repeat BMP in a.m. If potassium is low give KCl 20 mEq twice daily x 2 days. The patient currently is not tachypneic, reports no shortness of breath, and is not hypoxic,  Tobacco use Continue nicotine patch  Hypertension Continue Toprol, Cozaar, Inspra  COPD Noted on chest x-ray, would benefit from stopping smoking both tobacco and if he smoking and cocaine.  Abnormal TSH Agree with checking free T3 and free T4, if abnormal please call us back  Implantable AICD   Please contact [facility] if we can be of assistance. Thank you for this consultation.  Chief Complaint: Question about his ongoing medical treatment  HPI:  Patient is currently admitted to behavioral health Hospital for depression and suicidal ideation.  He has a complicated medical history  significant for HIV, CHF, implantable defibrillator, hypertension, ongoing tobacco use, and hyperlipidemia.  Review of Systems:  Per psychiatry physician he has no shortness of breath or orthopnea  Past Medical History:  Diagnosis Date  . Active smoker   . AICD (automatic cardioverter/defibrillator) present   . Alcohol abuse   . Anxiety   . AR (allergic rhinitis)   . Bipolar 1 disorder (HCC)   . CHF (congestive heart failure) (HCC)   . Chronic bronchitis (HCC)   . Chronic systolic heart failure (HCC)   . COPD (chronic obstructive pulmonary disease) (HCC)   . Cough 12/31/2018  . Crack cocaine use   . Depression   . Genital herpes   . HIV (human immunodeficiency virus infection) (HCC) dx'd 08/1993  . HLD (hyperlipidemia)   . Hypertension   . NICM (nonischemic cardiomyopathy) (HCC)    Echocardiogram 06/28/11: EF 30-35%, mild MR, mild LAE;  No CAD by coronary CT angiogram 3/12 at [facility]  . NSVT (nonsustained ventricular tachycardia) (HCC)   . PTSD (post-traumatic stress disorder)    Past Surgical History:  Procedure Laterality Date  . CARDIAC DEFIBRILLATOR PLACEMENT  01/08/2018  . ICD IMPLANT N/A 01/08/2018   Procedure: ICD IMPLANT;  Surgeon: Regan Lemming, MD;  Location: [facility] INVASIVE CV LAB;  Service: Cardiovascular;  Laterality: N/A;   Social History:  reports that he has been smoking cigarettes. He has a 28.50 pack-year smoking history. He has never used smokeless tobacco. He reports previous alcohol use of about 6.0 standard drinks of alcohol per week. He reports previous drug use. Drug: Cocaine.  No Known Allergies Family History  Problem Relation Age of Onset  . Glaucoma Mother   .  Mental illness Mother   . Vision loss Mother   . Hypertension Father   . CAD Father   . Mental illness Father   . Alcohol abuse Father   . Drug abuse Father   . Heart attack Father 44  . Early death Father   . Alcohol abuse Brother   . Drug abuse Brother   . Drug  abuse Brother     Prior to Admission medications   Medication Sig Start Date End Date Taking? Authorizing Provider  bictegravir-emtricitabine-tenofovir AF (BIKTARVY) 50-200-25 MG TABS tablet Take 1 tablet by mouth daily. 03/08/20  Yes Daiva Eves, Lisette Grinder, MD  bumetanide (BUMEX) 1 MG tablet Take 1 mg by mouth daily.   Yes [provider]  buPROPion (WELLBUTRIN XL) 150 MG 24 hr tablet Take by mouth. 01/19/20  Yes [provider]  eplerenone (INSPRA) 25 MG tablet TAKE 1/2 TABLET(12.5 MG) BY MOUTH DAILY Patient taking differently: Take 12.5 mg by mouth daily. 12.5mg  02/10/19  Yes Bensimhon, Bevelyn Buckles, MD  finasteride (PROSCAR) 5 MG tablet Take 5 mg by mouth daily. 02/26/20  Yes [provider]  hydrOXYzine (VISTARIL) 25 MG capsule Take 1 capsule by mouth daily. 04/14/20  Yes [provider]  losartan (COZAAR) 25 MG tablet Take 25 mg by mouth daily.   Yes [provider]  Melatonin 10 MG TBDP 10 mg.   Yes [provider]  metoprolol succinate (TOPROL-XL) 25 MG 24 hr tablet Take 25 mg by mouth daily. 06/14/20  Yes [provider]  potassium chloride SA (K-DUR,KLOR-CON) 20 MEQ tablet Take 1 tablet (20 mEq total) by mouth daily. 01/07/18  Yes Bensimhon, Bevelyn Buckles, MD  rivaroxaban (XARELTO) 20 MG TABS tablet Take 20 mg by mouth daily after supper. 06/10/20  Yes [provider]  rosuvastatin (CRESTOR) 10 MG tablet Take 20 mg by mouth at bedtime.  06/10/20  Yes [provider]  sildenafil (VIAGRA) 50 MG tablet Take 1 tablet (50 mg total) by mouth daily as needed for erectile dysfunction. At least 24 hours between doses 01/21/18  Yes Newlin, Enobong, MD  tamsulosin (FLOMAX) 0.4 MG CAPS capsule Take 0.8 mg by mouth daily. 02/16/20  Yes [provider]  traZODone (DESYREL) 100 MG tablet Take 100 mg by mouth at bedtime as needed for sleep. 06/10/20  Yes [provider]  valACYclovir (VALTREX) 500 MG tablet Take 1 tablet (500 mg  total) by mouth daily. For Herpes. 03/17/19  Yes Fulp, Cammie, MD  VENTOLIN HFA 108 (90 Base) MCG/ACT inhaler INHALE 2 PUFFS INTO THE LUNGS EVERY 6 HOURS AS NEEDED FOR WHEEZING OR SHORTNESS OF BREATH 03/17/19  Yes Fulp, Cammie, MD   Physical Exam: Blood pressure 110/75, pulse 98, temperature 98.2 F (36.8 C), temperature source Oral, resp. rate 20, height 5\' 9"  (1.753 m), weight 91.2 kg, SpO2 99 %. Vitals:   06/27/20 0609 06/27/20 0610  BP: 116/72 110/75  Pulse: 82 98  Resp: 20   Temp: 98.2 F (36.8 C)   SpO2: 99%      This is a phone consultation only, no physical exam was done.  Labs on Admission:  Basic Metabolic Panel: Recent Labs  Lab 06/24/20 1923  NA 140  K 3.7  CL 106  CO2 24  GLUCOSE 87  BUN 7  CREATININE 1.17  CALCIUM 8.8*   Liver Function Tests: Recent Labs  Lab 06/24/20 1923  AST 28  ALT 28  ALKPHOS 54  BILITOT 1.7*  PROT 6.9  ALBUMIN 4.0  CBC: Recent Labs  Lab 06/24/20 1923  WBC 3.4*  NEUTROABS 1.7  HGB 16.5  HCT 47.8  MCV 92.8  PLT 166    EKG: Independently reviewed.  Normal sinus rhythm, septal infarct, age undetermined, flipped T waves in the inferior leads.  Time spent: 30  Reva Bores [facility] If 7PM-7AM, please contact night-coverage 06/27/2020, 4:06 PM

## 2020-06-27 NOTE — Progress Notes (Signed)
  Pt is black male  of 59 y.o. , DOB 18-Feb-1961, MRN  [id number]  presents Voluntary with a SI without method,  Hx of HTN.    Patient   reports good appetite, energy normal, and fair sleeping pattern without mediaction. Pt rates  Depression 5 out of 10, hopelessness 7 out of 10, and anxiety at 4 out of 10. Pt reports SI sometimes without  plans. Pt endorses  AVH and decreasing and denies HI. Pt reports LBM yesterday, no physical problems, denies pain .  Vitals signs being monitored.  Medication given as Rx, no adverse reaction observed, safety maintained with q15 minute checks.   Einar Crow. Melvyn Neth MSN, RN, [facility] [phone]

## 2020-06-28 LAB — BASIC METABOLIC PANEL
Anion gap: 8 (ref 5–15)
BUN: 22 mg/dL — ABNORMAL HIGH (ref 6–20)
CO2: 24 mmol/L (ref 22–32)
Calcium: 8.9 mg/dL (ref 8.9–10.3)
Chloride: 106 mmol/L (ref 98–111)
Creatinine, Ser: 1.53 mg/dL — ABNORMAL HIGH (ref 0.61–1.24)
GFR calc Af Amer: 57 mL/min — ABNORMAL LOW (ref >60)
GFR calc non Af Amer: 49 mL/min — ABNORMAL LOW (ref >60)
Glucose, Bld: 137 mg/dL — ABNORMAL HIGH (ref 70–99)
Potassium: 4.4 mmol/L (ref 3.5–5.1)
Sodium: 138 mmol/L (ref 135–145)

## 2020-06-28 LAB — T4, FREE: Free T4: 0.68 ng/dL (ref 0.61–1.12)

## 2020-06-28 MED ORDER — WHITE PETROLATUM EX OINT
TOPICAL_OINTMENT | CUTANEOUS | Status: AC
  Filled 2020-06-28: qty 5

## 2020-06-28 MED ORDER — TRAZODONE HCL 100 MG PO TABS
100.0000 mg | ORAL_TABLET | Freq: Every evening | ORAL | Status: DC | PRN
  Administered 2020-06-28 – 2020-06-30 (×3): 100 mg via ORAL
  Filled 2020-06-28 (×3): qty 1
  Filled 2020-06-28: qty 21

## 2020-06-28 NOTE — Progress Notes (Signed)
Recreation Therapy Notes  Date: 7.12.21 Time: 0930 Location: 300 Hall Dayroom  Group Topic: Stress Management  Goal Area(s) Addresses:  Patient will identify positive stress management techniques. Patient will identify benefits of using stress management post d/c.    Intervention: Stress Management  Activity:  Meditation.  LRT played a meditation that focused on being resilient in the face of change.  Patients were to listen and follow along as meditation played to engage in activity.    Education:  Stress Management, Discharge Planning.   Education Outcome: Acknowledges Education  Clinical Observations/Feedback: Pt did not attend group activity.    [name], LRT/CTRS         Lillia Abed, Hadeel Hillebrand A 06/28/2020 10:50 AM

## 2020-06-28 NOTE — Progress Notes (Signed)
Psychoeducational Group Note  Date:  06/28/2020 Time:  2125  Group Topic/Focus:  Wrap-Up Group:   The focus of this group is to help patients review their daily goal of treatment and discuss progress on daily workbooks.  Participation Level: Did Not Attend  Participation Quality:  Not Applicable  Affect:  Not Applicable  Cognitive:  Not Applicable  Insight:  Not Applicable  Engagement in Group: Not Applicable  Additional Comments:  The patient did not attend group this evening.   Hazle Coca S 06/28/2020, 9:25 PM
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
## 2020-06-28 NOTE — Progress Notes (Signed)
°   06/28/20 0610  Vital Signs  Pulse Rate (!) 101  Pulse Rate Source Monitor  BP 104/73  BP Location Left Arm  BP Method Automatic  Patient Position (if appropriate) Standing  Oxygen Therapy  SpO2 100 %  D: Patient Presents with depressed and anxious mood and affect.  Patient was calm and cooperative during med pass and took his medicine without incident.  Patient was out in open areas and was social with peers and staff.  Patient denies suicidal thoughts and self harming thoughts., Patient rated both depression and anxiety 5/10. .A:  Patient took scheduled medicine.  Support and encouragement provided Routine safety checks conducted every 15 minutes. Patient  Informed to notify staff with any concerns.  R:  Safety maintained.

## 2020-06-28 NOTE — Progress Notes (Addendum)
[facility] MD Progress Note  06/28/2020 12:43 PM Ryan Long  MRN:  [id number] Subjective:Patient reports " I am starting to feel a little better", although describes some lingering depression. Denies suicidal ideations and remains future oriented, interested in going to a rehab at discharge. At this time denies medication side effects. Reports he did not sleep well last night until he got a second dose of Trazodone ( for a total of 100 mgrs QHS) after which he slept better. Denies side effects.  Objective : I discussed case with treatment team and have met with patient 59 year old male, currently homeless, presented to hospital reporting worsening depression, suicidal ideations, visual hallucinations, alcohol and cocaine abuse.  Reported last alcohol consumption at 4 days prior to admission and 7/8 BAL was negative.  UDS was positive for cocaine.  History of medical illnesses to include CHF, pacemaker/automatic defibrillator, HIV positive.  Today patient presents alert, attentive, calm , polite and cooperative on approach. Patient is reporting partially improved mood and presents future oriented, focusing on disposition planning and expressing interest in going to a rehab setting at discharge. He describes homelessness as a major stressor. Currently he is not presenting with significant symptoms of Alcohol WDL- no tremors, no diaphoresis, no restlessness or psychomotor agitation. Tolerating medications well. Behavior on unit calm and in good control, visible in day room.    Principal Problem: Cocaine/alcohol use disorder, substance-induced mood disorder versus MDD with psychotic symptoms. Diagnosis: Active Problems:   Human immunodeficiency virus (HIV) disease ([place])   Recurrent HSV (herpes simplex virus)   Hyperlipidemia   Chronic systolic heart failure (HCC)   Tobacco use disorder   Hypertension   Chronic obstructive pulmonary disease (HCC)   Abnormal thyroid stimulating hormone (TSH)  level   AICD (automatic cardioverter/defibrillator) present   Severe recurrent major depression with psychotic features ([place])  Total Time spent with patient: 15 minutes   Past Psychiatric History: Substance Abuse Disorder, Alcohol Abuse Disorder  Past Medical History:  Past Medical History:  Diagnosis Date  . Active smoker   . AICD (automatic cardioverter/defibrillator) present   . Alcohol abuse   . Anxiety   . AR (allergic rhinitis)   . Bipolar 1 disorder ([place])   . CHF (congestive heart failure) ([place])   . Chronic bronchitis ([place])   . Chronic systolic heart failure ([place])   . COPD (chronic obstructive pulmonary disease) ([place])   . Cough 12/31/2018  . Crack cocaine use   . Depression   . Genital herpes   . HIV (human immunodeficiency virus infection) ([place]) dx'd 08/1993  . HLD (hyperlipidemia)   . Hypertension   . NICM (nonischemic cardiomyopathy) ([place])    Echocardiogram 06/28/11: EF 30-35%, mild MR, mild LAE;  No CAD by coronary CT angiogram 3/12 at [facility]  . NSVT (nonsustained ventricular tachycardia) ([place])   . PTSD (post-traumatic stress disorder)     Past Surgical History:  Procedure Laterality Date  . CARDIAC DEFIBRILLATOR PLACEMENT  01/08/2018  . ICD IMPLANT N/A 01/08/2018   Procedure: ICD IMPLANT;  Surgeon: Constance Haw, MD;  Location: [facility] CV LAB;  Service: Cardiovascular;  Laterality: N/A;   Family History:  Family History  Problem Relation Age of Onset  . Glaucoma Mother   . Mental illness Mother   . Vision loss Mother   . Hypertension Father   . CAD Father   . Mental illness Father   . Alcohol abuse Father   . Drug abuse Father   . Heart attack  Father 82  . Early death Father   . Alcohol abuse Brother   . Drug abuse Brother   . Drug abuse Brother    Family Psychiatric  History: Declined to provide information about family Social History:  Social History   Substance and Sexual Activity  Alcohol Use Not Currently  .  Alcohol/week: 6.0 standard drinks  . Types: 6 Cans of beer per week   Comment: 01/08/2018 "stopped 06/2017"     Social History   Substance and Sexual Activity  Drug Use Not Currently  . Types: Cocaine   Comment: 01/08/2018 "stopped 06/2017"    Social History   Socioeconomic History  . Marital status: Single    Spouse name: Not on file  . Number of children: Not on file  . Years of education: Not on file  . Highest education level: Not on file  Occupational History  . Not on file  Tobacco Use  . Smoking status: Current Every Day Smoker    Packs/day: 0.75    Years: 38.00    Pack years: 28.50    Types: Cigarettes  . Smokeless tobacco: Never Used  . Tobacco comment: hoping welbutrin will help  Vaping Use  . Vaping Use: Never used  Substance and Sexual Activity  . Alcohol use: Not Currently    Alcohol/week: 6.0 standard drinks    Types: 6 Cans of beer per week    Comment: 01/08/2018 "stopped 06/2017"  . Drug use: Not Currently    Types: Cocaine    Comment: 01/08/2018 "stopped 06/2017"  . Sexual activity: Yes    Birth control/protection: Condom    Comment: pt. declined condoms  Other Topics Concern  . Not on file  Social History Narrative  . Not on file   Social Determinants of Health   Financial Resource Strain:   . Difficulty of Paying Living Expenses:   Food Insecurity:   . Worried About Charity fundraiser in the Last Year:   . Arboriculturist in the Last Year:   Transportation Needs:   . Film/video editor (Medical):   Marland Kitchen Lack of Transportation (Non-Medical):   Physical Activity:   . Days of Exercise per Week:   . Minutes of Exercise per Session:   Stress:   . Feeling of Stress :   Social Connections:   . Frequency of Communication with Friends and Family:   . Frequency of Social Gatherings with Friends and Family:   . Attends Religious Services:   . Active Member of Clubs or Organizations:   . Attends Archivist Meetings:   Marland Kitchen Marital Status:     Additional Social History:   Sleep: Fair  Appetite:  improving  Current Medications: Current Facility-Administered Medications  Medication Dose Route Frequency Provider Last Rate Last Admin  . acetaminophen (TYLENOL) tablet 650 mg  650 mg Oral Q6H PRN Lindon Romp A, NP      . albuterol (VENTOLIN HFA) 108 (90 Base) MCG/ACT inhaler 1-2 puff  1-2 puff Inhalation Q6H PRN Sharma Covert, MD      . alum & mag hydroxide-simeth (MAALOX/MYLANTA) 200-200-20 MG/5ML suspension 30 mL  30 mL Oral Q4H PRN Lindon Romp A, NP      . bictegravir-emtricitabine-tenofovir AF (BIKTARVY) 50-200-25 MG per tablet 1 tablet  1 tablet Oral Daily Sharma Covert, MD   1 tablet at 06/28/20 0931  . bumetanide (BUMEX) tablet 2 mg  2 mg Oral Daily Donnamae Jude, MD      .  buPROPion (WELLBUTRIN XL) 24 hr tablet 150 mg  150 mg Oral Daily Sharma Covert, MD   150 mg at 06/28/20 0930  . eplerenone (INSPRA) tablet 12.5 mg  12.5 mg Oral Daily , Myer Peer, MD   12.5 mg at 06/28/20 0924  . finasteride (PROSCAR) tablet 5 mg  5 mg Oral Daily Sharma Covert, MD   5 mg at 06/28/20 0925  . losartan (COZAAR) tablet 25 mg  25 mg Oral Daily Sharma Covert, MD   25 mg at 06/28/20 0925  . magnesium hydroxide (MILK OF MAGNESIA) suspension 30 mL  30 mL Oral Daily PRN Lindon Romp A, NP   30 mL at 06/26/20 2122  . melatonin tablet 5 mg  5 mg Oral QHS , Myer Peer, MD   5 mg at 06/27/20 2106  . metoprolol succinate (TOPROL-XL) 24 hr tablet 25 mg  25 mg Oral Daily Sharma Covert, MD   25 mg at 06/28/20 0925  . nicotine (NICODERM CQ - dosed in mg/24 hours) patch 14 mg  14 mg Transdermal Daily , Myer Peer, MD   14 mg at 06/28/20 0926  . potassium chloride SA (KLOR-CON) CR tablet 20 mEq  20 mEq Oral Daily Sharma Covert, MD   20 mEq at 06/28/20 0926  . rivaroxaban (XARELTO) tablet 20 mg  20 mg Oral Daily Sharma Covert, MD   20 mg at 06/28/20 0926  . tamsulosin (FLOMAX) capsule 0.8 mg  0.8 mg Oral  QPC supper Sharma Covert, MD   0.8 mg at 06/27/20 1851  . traZODone (DESYREL) tablet 50 mg  50 mg Oral QHS PRN , Myer Peer, MD   50 mg at 06/27/20 2107  . valACYclovir (VALTREX) tablet 500 mg  500 mg Oral Daily Sharma Covert, MD   500 mg at 06/28/20 9476    Lab Results:  Results for orders placed or performed during the hospital encounter of 06/25/20 (from the past 48 hour(s))  Brain natriuretic peptide     Status: Abnormal   Collection Time: 06/26/20  5:37 PM  Result Value Ref Range   B Natriuretic Peptide 179.8 (H) 0.0 - 100.0 pg/mL    Comment: Performed at [facility], [place] [address], [place], [place] [address]    Blood Alcohol level:  Lab Results  Component Value Date   ETH <10 06/24/2020   ETH <10 35/46/5681    Metabolic Disorder Labs: Lab Results  Component Value Date   HGBA1C 5.7 (H) 06/25/2020   MPG 116.89 06/25/2020   MPG 105 09/24/2015   Lab Results  Component Value Date   PROLACTIN 32.7 (H) 08/08/2015   Lab Results  Component Value Date   CHOL 125 06/25/2020   TRIG 156 (H) 06/25/2020   HDL 51 06/25/2020   CHOLHDL 2.5 06/25/2020   VLDL 31 06/25/2020   LDLCALC 43 06/25/2020   LDLCALC 82 03/08/2020    Physical Findings: AIMS: Facial and Oral Movements Muscles of Facial Expression: None, normal Lips and Perioral Area: None, normal Jaw: None, normal Tongue: None, normal,Extremity Movements Upper (arms, wrists, hands, fingers): None, normal Lower (legs, knees, ankles, toes): None, normal, Trunk Movements Neck, shoulders, hips: None, normal, Overall Severity Severity of abnormal movements (highest score from questions above): None, normal Incapacitation due to abnormal movements: None, normal Patient's awareness of abnormal movements (rate only patient's report): No Awareness, Dental Status Current problems with teeth and/or dentures?: No Does patient usually wear dentures?: No  CIWA:  CIWA-Ar Total: 0 COWS:  COWS Total  Score: 1  Musculoskeletal: Strength & Muscle Tone: within normal limits-calm, no tremors, no diaphoresis Gait & Station: normal Patient leans: N/A  Psychiatric Specialty Exam: Physical Exam  Review of Systems denies dyspnea at room air, no orthopnea denies chest pain, no nocturnal paroxystic dyspnea and does not endorse peripheral edema at this time, no vomiting  Blood pressure 104/73, pulse (!) 101, temperature 98 F (36.7 C), temperature source Oral, resp. rate 20, height 5' 9" (1.753 m), weight 91.2 kg, SpO2 100 %.Body mass index is 29.68 kg/m.  General Appearance: Casual  Eye Contact:  Good  Speech:  Normal Rate  Volume:  Normal  Mood:  improving  Affect:  improving, presents with fuller range of affect  Thought Process:  Linear and Descriptions of Associations: Intact  Orientation:  Other:  Presents alert and attentive  Thought Content:  denies hallucinations today and does not appear internally preoccupied   Suicidal Thoughts:  No currently denies suicidal plan or intention and contracts for safety on unit, presents future oriented at this time  Homicidal Thoughts:  No  Memory:  Recent and remote grossly intact  Judgement:  Fair/improving  Insight:  Fair  Psychomotor Activity:  Normal-no tremors, no diaphoresis, no restlessness or psychomotor agitation  Concentration:  Concentration: Improving and Attention Span: Improving  Recall:  Good  Fund of Knowledge:  Good  Language:  Good  Akathisia:  No  Handed:  Right  AIMS (if indicated):     Assets:  Desire for Improvement Resilience  ADL's:  Intact  Cognition:  WNL  Sleep:  Number of Hours: 6   Assessment: 59 year old male, currently homeless, presented to hospital reporting worsening depression, suicidal ideations, visual hallucinations, alcohol and cocaine abuse.  Reported last alcohol consumption at 4 days prior to admission and 7/8 BAL was negative.  UDS was positive for cocaine.  History of medical illnesses to  include CHF, pacemaker/automatic defibrillator, HIV positive.  [name] is presenting with improving mood and range of affect. Today denies SI and presents future oriented, interested in going to a rehab setting at discharge. Homelessness has been a major stressor for patient. Today denies hallucinations and does not appear internally preoccupied/ no symptoms of psychosis are noted . He is calm, in no acute distress, and is  not currently presenting with significant WDL symptoms.  He has history of CHF- currently does not endorse dyspnea,orthopnea or peripheral edema and presents comfortable in no acute distress.  Treatment Plan Summary  Treatment Plan reviewed as below today 7/12 Encourage group and milieu participation Encourage efforts to work on sobriety and relapse prevention Patient is expressing interest in going to a rehab at discharge (specifically interested in going to [facility]). Continue Wellbutrin XL 150 mg daily for depression Continue Hydroxyzine 25 mg TID PRN for Anxiety. Increase  Trazodone to 100 mg QHS as needed for insomnia Continue melatonin 5 mg nightly for insomnia Continue Proscar 5 mg daily, Inspra 12.5 mg daily, Cozaar 25 mg daily, Bumex 1 mg daily, Xarelto 20 mg daily for history of CHF/cardiac disease. Continue Biktarvy 1 tablet daily for HIV FT3, FT4 ordered to follow-up on low TSH.    Jenne Campus, MD 06/28/2020, 12:43 PM   Patient ID: Ryan Long, male   DOB: 1961-01-18, 59 y.o.   MRN: [id number]

## 2020-06-29 LAB — T3, FREE: T3, Free: 3.4 pg/mL (ref 2.0–4.4)

## 2020-06-29 MED ORDER — HYDROXYZINE HCL 25 MG PO TABS
25.0000 mg | ORAL_TABLET | Freq: Every evening | ORAL | Status: DC | PRN
  Administered 2020-06-29 – 2020-06-30 (×2): 25 mg via ORAL
  Filled 2020-06-29: qty 30
  Filled 2020-06-29 (×2): qty 1

## 2020-06-29 MED ORDER — MELATONIN 5 MG PO TABS
10.0000 mg | ORAL_TABLET | Freq: Every day | ORAL | Status: DC
  Administered 2020-06-29 – 2020-06-30 (×2): 10 mg via ORAL
  Filled 2020-06-29 (×5): qty 2

## 2020-06-29 MED ORDER — BUMETANIDE 1 MG PO TABS
1.0000 mg | ORAL_TABLET | Freq: Every day | ORAL | Status: DC
  Administered 2020-06-30 – 2020-07-01 (×2): 1 mg via ORAL
  Filled 2020-06-29 (×3): qty 1

## 2020-06-29 NOTE — Progress Notes (Signed)
Psychoeducational Group Note  Date:  06/29/2020 Time:  2052  Group Topic/Focus:  Wrap-Up Group:   The focus of this group is to help patients review their daily goal of treatment and discuss progress on daily workbooks.  Participation Level: Did Not Attend  Participation Quality:  Not Applicable  Affect:  Not Applicable  Cognitive:  Not Applicable  Insight:  Not Applicable  Engagement in Group: Not Applicable  Additional Comments:  The patient did not attend group this evening.   Hazle Coca S 06/29/2020, 8:52 PM

## 2020-06-29 NOTE — Progress Notes (Signed)
DAR NOTE: Patient presents with anxious affect and calm mood.  Denies pain, auditory and visual hallucinations.  Maintained on routine safety checks.  Medications given as prescribed.  Support and encouragement offered as needed.  Will continue to monitor.

## 2020-06-29 NOTE — Progress Notes (Signed)
Pt became angry with room mate  and cursed him out. Pt stated the room mate took longer in the shower and he had to come out to use the bathroom in the hallway. Then room mate instead of going to sleep after long shower, the room mate turned the light on and started reading the bible, pt became annoyed and cursed the room mate out. Pt room mate was to move to quite room since when had no room available. Pt in the room alone at this time resting, will continue to monitor.

## 2020-06-29 NOTE — Plan of Care (Signed)
Progress note  D: pt found in bed; compliant with medication administration. Pt denies any physical complaints or pain. Pt does express concerns with anxiety, depression, sadness and passive thoughts of si. Pt denies a plan at this time and contracts for safety. Pt had concerns with their roommate but roommate discharged. Pt also expresses concerns with discharge and follow up. Pt is pleasant. Pt denies hi/ah/vh and verbally agrees to approach staff if these become apparent or before harming themself/others while at bhh.  A: Pt provided support and encouragement. Pt given medication per protocol and standing orders. Q26m safety checks implemented and continued.  R: Pt safe on the unit. Will continue to monitor.  Pt progressing in the following metrics  Problem: Education: Goal: Knowledge of [facility] Education information/materials will improve Outcome: Progressing Goal: Emotional status will improve Outcome: Progressing Goal: Mental status will improve Outcome: Progressing Goal: Verbalization of understanding the information provided will improve Outcome: Progressing

## 2020-06-29 NOTE — Progress Notes (Signed)
[facility] MD Progress Note  06/29/2020 11:28 AM Keelen Quevedo  MRN:  [id number] Subjective:  "I'm so-so."  Mr. Dieppa found resting in bed. He reports anxiety/depressed mood related to discharge planning. He is homeless and does not have sufficient funds to return to the hotel where he was staying. He was hopeful to go to [facility] for rehab but found out today he likely will not be accepted.   He denies withdrawal symptoms or cravings. He reports "very little" SI compared to admission and denies suicidal plan or intent. He has been visible in the milieu, attending groups. He denies AVH. He does complain of difficulty sleeping and states he usually takes Vistaril at HS along with trazodone and 10 mg melatonin at home.  From admission H&P: 59 year old male, currently homeless, presented to hospital reporting worsening depression, suicidal ideations, visual hallucinations, alcohol and cocaine abuse.  Reported last alcohol consumption at 4 days prior to admission and 7/8 BAL was negative.  UDS was positive for cocaine.  History of medical illnesses to include CHF, pacemaker/automatic defibrillator, HIV positive.  Principal Problem: <principal problem not specified> Diagnosis: Active Problems:   Human immunodeficiency virus (HIV) disease (HCC)   Recurrent HSV (herpes simplex virus)   Hyperlipidemia   Chronic systolic heart failure (HCC)   Tobacco use disorder   Hypertension   Chronic obstructive pulmonary disease (HCC)   Abnormal thyroid stimulating hormone (TSH) level   AICD (automatic cardioverter/defibrillator) present   Severe recurrent major depression with psychotic features (HCC)  Total Time spent with patient: 15 minutes  Past Psychiatric History: See admission H&P  Past Medical History:  Past Medical History:  Diagnosis Date  . Active smoker   . AICD (automatic cardioverter/defibrillator) present   . Alcohol abuse   . Anxiety   . AR (allergic rhinitis)   . Bipolar 1 disorder  (HCC)   . CHF (congestive heart failure) (HCC)   . Chronic bronchitis (HCC)   . Chronic systolic heart failure (HCC)   . COPD (chronic obstructive pulmonary disease) (HCC)   . Cough 12/31/2018  . Crack cocaine use   . Depression   . Genital herpes   . HIV (human immunodeficiency virus infection) (HCC) dx'd 08/1993  . HLD (hyperlipidemia)   . Hypertension   . NICM (nonischemic cardiomyopathy) (HCC)    Echocardiogram 06/28/11: EF 30-35%, mild MR, mild LAE;  No CAD by coronary CT angiogram 3/12 at [facility]  . NSVT (nonsustained ventricular tachycardia) (HCC)   . PTSD (post-traumatic stress disorder)     Past Surgical History:  Procedure Laterality Date  . CARDIAC DEFIBRILLATOR PLACEMENT  01/08/2018  . ICD IMPLANT N/A 01/08/2018   Procedure: ICD IMPLANT;  Surgeon: Regan Lemming, MD;  Location: [facility] INVASIVE CV LAB;  Service: Cardiovascular;  Laterality: N/A;   Family History:  Family History  Problem Relation Age of Onset  . Glaucoma Mother   . Mental illness Mother   . Vision loss Mother   . Hypertension Father   . CAD Father   . Mental illness Father   . Alcohol abuse Father   . Drug abuse Father   . Heart attack Father 59  . Early death Father   . Alcohol abuse Brother   . Drug abuse Brother   . Drug abuse Brother    Family Psychiatric  History: See admission H&P Social History:  Social History   Substance and Sexual Activity  Alcohol Use Not Currently  . Alcohol/week: 6.0 standard drinks  . Types:  6 Cans of beer per week   Comment: 01/08/2018 "stopped 06/2017"     Social History   Substance and Sexual Activity  Drug Use Not Currently  . Types: Cocaine   Comment: 01/08/2018 "stopped 06/2017"    Social History   Socioeconomic History  . Marital status: Single    Spouse name: Not on file  . Number of children: Not on file  . Years of education: Not on file  . Highest education level: Not on file  Occupational History  . Not on file   Tobacco Use  . Smoking status: Current Every Day Smoker    Packs/day: 0.75    Years: 38.00    Pack years: 28.50    Types: Cigarettes  . Smokeless tobacco: Never Used  . Tobacco comment: hoping welbutrin will help  Vaping Use  . Vaping Use: Never used  Substance and Sexual Activity  . Alcohol use: Not Currently    Alcohol/week: 6.0 standard drinks    Types: 6 Cans of beer per week    Comment: 01/08/2018 "stopped 06/2017"  . Drug use: Not Currently    Types: Cocaine    Comment: 01/08/2018 "stopped 06/2017"  . Sexual activity: Yes    Birth control/protection: Condom    Comment: pt. declined condoms  Other Topics Concern  . Not on file  Social History Narrative  . Not on file   Social Determinants of Health   Financial Resource Strain:   . Difficulty of Paying Living Expenses:   Food Insecurity:   . Worried About Programme researcher, broadcasting/film/video in the Last Year:   . Barista in the Last Year:   Transportation Needs:   . Freight forwarder (Medical):   Marland Kitchen Lack of Transportation (Non-Medical):   Physical Activity:   . Days of Exercise per Week:   . Minutes of Exercise per Session:   Stress:   . Feeling of Stress :   Social Connections:   . Frequency of Communication with Friends and Family:   . Frequency of Social Gatherings with Friends and Family:   . Attends Religious Services:   . Active Member of Clubs or Organizations:   . Attends Banker Meetings:   Marland Kitchen Marital Status:    Additional Social History:                         Sleep: Poor  Appetite:  Fair  Current Medications: Current Facility-Administered Medications  Medication Dose Route Frequency Provider Last Rate Last Admin  . acetaminophen (TYLENOL) tablet 650 mg  650 mg Oral Q6H PRN Nira Conn A, NP   650 mg at 06/28/20 2357  . albuterol (VENTOLIN HFA) 108 (90 Base) MCG/ACT inhaler 1-2 puff  1-2 puff Inhalation Q6H PRN Antonieta Pert, MD      . alum & mag hydroxide-simeth  (MAALOX/MYLANTA) 200-200-20 MG/5ML suspension 30 mL  30 mL Oral Q4H PRN Nira Conn A, NP      . bictegravir-emtricitabine-tenofovir AF (BIKTARVY) 50-200-25 MG per tablet 1 tablet  1 tablet Oral Daily Antonieta Pert, MD   1 tablet at 06/29/20 1001  . bumetanide (BUMEX) tablet 2 mg  2 mg Oral Daily Reva Bores, MD      . buPROPion (WELLBUTRIN XL) 24 hr tablet 150 mg  150 mg Oral Daily Antonieta Pert, MD   150 mg at 06/29/20 1001  . eplerenone (INSPRA) tablet 12.5 mg  12.5 mg Oral Daily  Cobos, Rockey Situ, MD   12.5 mg at 06/29/20 1001  . finasteride (PROSCAR) tablet 5 mg  5 mg Oral Daily Antonieta Pert, MD   5 mg at 06/29/20 1001  . losartan (COZAAR) tablet 25 mg  25 mg Oral Daily Antonieta Pert, MD   25 mg at 06/29/20 1001  . magnesium hydroxide (MILK OF MAGNESIA) suspension 30 mL  30 mL Oral Daily PRN Nira Conn A, NP   30 mL at 06/26/20 2122  . melatonin tablet 5 mg  5 mg Oral QHS Cobos, Rockey Situ, MD   5 mg at 06/28/20 2117  . metoprolol succinate (TOPROL-XL) 24 hr tablet 25 mg  25 mg Oral Daily Antonieta Pert, MD   25 mg at 06/29/20 1001  . nicotine (NICODERM CQ - dosed in mg/24 hours) patch 14 mg  14 mg Transdermal Daily Cobos, Rockey Situ, MD   14 mg at 06/29/20 1000  . potassium chloride SA (KLOR-CON) CR tablet 20 mEq  20 mEq Oral Daily Antonieta Pert, MD   20 mEq at 06/29/20 1000  . rivaroxaban (XARELTO) tablet 20 mg  20 mg Oral Daily Antonieta Pert, MD   20 mg at 06/29/20 1000  . tamsulosin (FLOMAX) capsule 0.8 mg  0.8 mg Oral QPC supper Antonieta Pert, MD   0.8 mg at 06/28/20 1808  . traZODone (DESYREL) tablet 100 mg  100 mg Oral QHS PRN Cobos, Rockey Situ, MD   100 mg at 06/28/20 2119  . valACYclovir (VALTREX) tablet 500 mg  500 mg Oral Daily Antonieta Pert, MD   500 mg at 06/29/20 1000    Lab Results:  Results for orders placed or performed during the hospital encounter of 06/25/20 (from the past 48 hour(s))  T3, free     Status: None    Collection Time: 06/28/20  6:09 PM  Result Value Ref Range   T3, Free 3.4 2.0 - 4.4 pg/mL    Comment: (NOTE) Performed At: [facility] [address] [place], [place] [id number] Jolene Schimke MD XY:[id number]   T4, free     Status: None   Collection Time: 06/28/20  6:09 PM  Result Value Ref Range   Free T4 0.68 0.61 - 1.12 ng/dL    Comment: (NOTE) Biotin ingestion may interfere with free T4 tests. If the results are inconsistent with the TSH level, previous test results, or the clinical presentation, then consider biotin interference. If needed, order repeat testing after stopping biotin. Performed at [facility], [address] [place], [place] [address]   Basic metabolic panel     Status: Abnormal   Collection Time: 06/28/20  6:09 PM  Result Value Ref Range   Sodium 138 135 - 145 mmol/L   Potassium 4.4 3.5 - 5.1 mmol/L   Chloride 106 98 - 111 mmol/L   CO2 24 22 - 32 mmol/L   Glucose, Bld 137 (H) 70 - 99 mg/dL    Comment: Glucose reference range applies only to samples taken after fasting for at least 8 hours.   BUN 22 (H) 6 - 20 mg/dL   Creatinine, Ser 6.75 (H) 0.61 - 1.24 mg/dL   Calcium 8.9 8.9 - 44.9 mg/dL   GFR calc non Af Amer 49 (L) >60 mL/min   GFR calc Af Amer 57 (L) >60 mL/min   Anion gap 8 5 - 15    Comment: Performed at [facility], [address], [place], [place] [address]  Blood Alcohol level:  Lab Results  Component Value Date   ETH <10 06/24/2020   ETH <10 02/25/2018    Metabolic Disorder Labs: Lab Results  Component Value Date   HGBA1C 5.7 (H) 06/25/2020   MPG 116.89 06/25/2020   MPG 105 09/24/2015   Lab Results  Component Value Date   PROLACTIN 32.7 (H) 08/08/2015   Lab Results  Component Value Date   CHOL 125 06/25/2020   TRIG 156 (H) 06/25/2020   HDL 51 06/25/2020   CHOLHDL 2.5 06/25/2020   VLDL 31 06/25/2020   LDLCALC 43 06/25/2020   LDLCALC 82 03/08/2020    Physical Findings: AIMS:  Facial and Oral Movements Muscles of Facial Expression: None, normal Lips and Perioral Area: None, normal Jaw: None, normal Tongue: None, normal,Extremity Movements Upper (arms, wrists, hands, fingers): None, normal Lower (legs, knees, ankles, toes): None, normal, Trunk Movements Neck, shoulders, hips: None, normal, Overall Severity Severity of abnormal movements (highest score from questions above): None, normal Incapacitation due to abnormal movements: None, normal Patient's awareness of abnormal movements (rate only patient's report): No Awareness, Dental Status Current problems with teeth and/or dentures?: No Does patient usually wear dentures?: No  CIWA:  CIWA-Ar Total: 0 COWS:  COWS Total Score: 1  Musculoskeletal: Strength & Muscle Tone: within normal limits Gait & Station: normal Patient leans: N/A  Psychiatric Specialty Exam: Physical Exam Vitals and nursing note reviewed.  Constitutional:      Appearance: He is well-developed.  Cardiovascular:     Rate and Rhythm: Normal rate.  Pulmonary:     Effort: Pulmonary effort is normal.  Neurological:     Mental Status: He is alert and oriented to person, place, and time.     Review of Systems  Constitutional: Negative.   Respiratory: Negative for cough and shortness of breath.   Psychiatric/Behavioral: Positive for dysphoric mood, sleep disturbance and suicidal ideas. Negative for agitation, behavioral problems, confusion, decreased concentration, hallucinations and self-injury. The patient is nervous/anxious. The patient is not hyperactive.     Blood pressure (!) 126/106, pulse (!) 102, temperature 98.2 F (36.8 C), temperature source Oral, resp. rate 20, height 5\' 9"  (1.753 m), weight 91.2 kg, SpO2 100 %.Body mass index is 29.68 kg/m.  General Appearance: Fairly Groomed  Eye Contact:  Good  Speech:  Normal Rate  Volume:  Normal  Mood:  Anxious  Affect:  Congruent  Thought Process:  Coherent  Orientation:  Full  (Time, Place, and Person)  Thought Content:  Logical  Suicidal Thoughts:  No  Homicidal Thoughts:  No  Memory:  Immediate;   Fair Recent;   Fair  Judgement:  Intact  Insight:  Fair  Psychomotor Activity:  Decreased  Concentration:  Concentration: Good and Attention Span: Good  Recall:  of Knowledge:  Fair  Language:  Good  Akathisia:  No  Handed:  Right  AIMS (if indicated):     Assets:  Communication Skills Desire for Improvement Resilience  ADL's:  Intact  Cognition:  WNL  Sleep:  Number of Hours: 4.75     Treatment Plan Summary: Daily contact with patient to assess and evaluate symptoms and progress in treatment and Medication management   Continue inpatient hospitalization.  Increase melatonin to 10 mg PO QHS for insomnia Start Vistaril 25 mg PO QHS PRN insomnia Continue trazodone 100 mg PO QHS PRN insomnia Continue Wellbutrin XL 150 mg PO daily for depression Continue Biktarvy tablet PO daily for HIV Continue Inspra 12.5 mg PO  daily for CHF Continue Proscar 5 mg PO daily for BPH Continue Cozaar 25 mg PO daily for HTN Continue Toprol XL 25 mg PO daily for HTN Continue Bumex 2 mg PO daily for CHF Continue potassium 20 meQ PO daily for supplementation Continue Xarelto 20 mg PO daily for anticoagulation Continue Flomax 0.8 mg PO daily for BPH Continue Valtrex 500 mg PO daily for antiviral  Patient will participate in the therapeutic group milieu.  Discharge disposition in progress.   Aldean Baker, NP 06/29/2020, 11:28 AM

## 2020-06-29 NOTE — Progress Notes (Signed)
Follow up with [name] for continued support.   Ryan Long was welcoming of chaplain presence.  During our visit, Xadrian learned that he will not be able to be admitted to [facility] and received information for [employer].  He expresses disappointment and frustration.  Chaplain provided pastoral presence, space to voice emotions, reassurance and reorientation to Grayland's previous stated desire to continue to invite others to help him.  Provided resources for spiritual support at [facility] request.

## 2020-06-30 MED ORDER — HYDROCORTISONE 1 % EX CREA
TOPICAL_CREAM | Freq: Three times a day (TID) | CUTANEOUS | Status: DC | PRN
  Administered 2020-06-30: 1 via TOPICAL

## 2020-06-30 MED ORDER — LOSARTAN POTASSIUM 25 MG PO TABS
25.0000 mg | ORAL_TABLET | Freq: Once | ORAL | Status: AC
  Administered 2020-06-30: 25 mg via ORAL
  Filled 2020-06-30: qty 1

## 2020-06-30 MED ORDER — LOSARTAN POTASSIUM 50 MG PO TABS
50.0000 mg | ORAL_TABLET | Freq: Every day | ORAL | Status: DC
  Administered 2020-07-01: 50 mg via ORAL
  Filled 2020-06-30 (×2): qty 1

## 2020-06-30 NOTE — [facility] Counselor (Signed)
Pt denies si/hi/ah/vh and verbally agrees to approach staff if these become apparent or before harming themself/others while at [facility].     Ruthann Cancer MSW, [employer] Clincal Social Worker  [facility]

## 2020-06-30 NOTE — Progress Notes (Signed)
Follow up with [name] for continued support around hospitalization, chemical dependency, seeking support.    Provided pastoral presence and conversation, space for Quintan to reflect on his emotional state, prayers at pt request.     Kristoffer expressed increased hopefulness having completed an intake interview with [facility].  Reflected with chaplain on his commitment to find support - framing this as holy persistence.  He expressed awareness that he may not be admitted and spoke of continuing to seek out "where god is leading me"

## 2020-06-30 NOTE — Progress Notes (Signed)
Spiritual care group on grief and loss facilitated by chaplain Burnis Kingfisher  Group Goal:  Support / Education around grief and loss  Members engage in facilitated group support and psycho-social education.  Group Description:  Following introductions and group rules, group members engaged in facilitated group dialog and support around topic of loss, with particular support around experiences of loss in their lives. Group Identified types of loss (relationships / self / things) and identified patterns, circumstances, and changes that precipitate losses. Reflected on thoughts / feelings around loss, normalized grief responses, and recognized variety in grief experience.  Patient Progress: PT was asleep on chaplain rounding.  Woken up by chaplain and invited to group.  Acknowledged invitation.  Did not attend group.

## 2020-06-30 NOTE — Progress Notes (Signed)
[facility] MD Progress Note  06/30/2020 3:13 PM Ryan Long  MRN:  [id number] Subjective: Patient is a 59 year old male with a past psychiatric history significant for cocaine dependence, alcohol dependence, posttraumatic stress disorder, depression who presented to the [facility] emergency department on 06/25/2020 with visual hallucinations and suicidal ideation.  Objective: Patient is seen and examined.  Patient is a 59 year old male with the above-stated past medical and psychiatric history seen in follow-up.  He is doing well.  He has no complaints today.  He is not having any psychotic symptoms, and not having any suicidal ideation.  His mood is stable.  He did get frustrated this morning and slept the hand at the tech who was keeping him from going to breakfast on the time that he wanted to go, and he was placed on unit restriction.  I discussed this with the patient and suggested that he needs to apologize to the staff member that that took place with to be able to reinstate his ability to leave the unit.  Reportedly he has been accepted into [facility] and we are trying to confirm the dates.  Social work believes it is for tomorrow.  From a medical standpoint I discussed with the patient that I had reduced his Bumex dosage.  His creatinine had gone from 1.17 to 1.53.  He denied any shortness of breath with the change in medications.  We will recheck a chemistry prior to discharge.  He denied any suicidal or homicidal ideation.  He denied any side effects of his medications.  He denied any auditory or visual hallucinations.  His blood pressure was elevated today at 145/121.  He was mildly tachycardic at a rate of 108.  He is afebrile.  His oxygen saturation on room air was 99%.  He slept 5.75 hours last night.  His current blood pressure medications at this point include the Bumex and losartan 25 mg p.o. daily.  I will increase that today.  Principal Problem: <principal problem not  specified> Diagnosis: Active Problems:   Human immunodeficiency virus (HIV) disease (HCC)   Recurrent HSV (herpes simplex virus)   Hyperlipidemia   Chronic systolic heart failure (HCC)   Tobacco use disorder   Hypertension   Chronic obstructive pulmonary disease (HCC)   Abnormal thyroid stimulating hormone (TSH) level   AICD (automatic cardioverter/defibrillator) present   Severe recurrent major depression with psychotic features (HCC)  Total Time spent with patient: 15 minutes  Past Psychiatric History: See admission H&P  Past Medical History:  Past Medical History:  Diagnosis Date  . Active smoker   . AICD (automatic cardioverter/defibrillator) present   . Alcohol abuse   . Anxiety   . AR (allergic rhinitis)   . Bipolar 1 disorder (HCC)   . CHF (congestive heart failure) (HCC)   . Chronic bronchitis (HCC)   . Chronic systolic heart failure (HCC)   . COPD (chronic obstructive pulmonary disease) (HCC)   . Cough 12/31/2018  . Crack cocaine use   . Depression   . Genital herpes   . HIV (human immunodeficiency virus infection) (HCC) dx'd 08/1993  . HLD (hyperlipidemia)   . Hypertension   . NICM (nonischemic cardiomyopathy) (HCC)    Echocardiogram 06/28/11: EF 30-35%, mild MR, mild LAE;  No CAD by coronary CT angiogram 3/12 at [facility]  . NSVT (nonsustained ventricular tachycardia) (HCC)   . PTSD (post-traumatic stress disorder)     Past Surgical History:  Procedure Laterality Date  . CARDIAC DEFIBRILLATOR  PLACEMENT  01/08/2018  . ICD IMPLANT N/A 01/08/2018   Procedure: ICD IMPLANT;  Surgeon: Regan Lemming, MD;  Location: [facility] INVASIVE CV LAB;  Service: Cardiovascular;  Laterality: N/A;   Family History:  Family History  Problem Relation Age of Onset  . Glaucoma Mother   . Mental illness Mother   . Vision loss Mother   . Hypertension Father   . CAD Father   . Mental illness Father   . Alcohol abuse Father   . Drug abuse Father   . Heart  attack Father 70  . Early death Father   . Alcohol abuse Brother   . Drug abuse Brother   . Drug abuse Brother    Family Psychiatric  History: See admission H&P Social History:  Social History   Substance and Sexual Activity  Alcohol Use Not Currently  . Alcohol/week: 6.0 standard drinks  . Types: 6 Cans of beer per week   Comment: 01/08/2018 "stopped 06/2017"     Social History   Substance and Sexual Activity  Drug Use Not Currently  . Types: Cocaine   Comment: 01/08/2018 "stopped 06/2017"    Social History   Socioeconomic History  . Marital status: Single    Spouse name: Not on file  . Number of children: Not on file  . Years of education: Not on file  . Highest education level: Not on file  Occupational History  . Not on file  Tobacco Use  . Smoking status: Current Every Day Smoker    Packs/day: 0.75    Years: 38.00    Pack years: 28.50    Types: Cigarettes  . Smokeless tobacco: Never Used  . Tobacco comment: hoping welbutrin will help  Vaping Use  . Vaping Use: Never used  Substance and Sexual Activity  . Alcohol use: Not Currently    Alcohol/week: 6.0 standard drinks    Types: 6 Cans of beer per week    Comment: 01/08/2018 "stopped 06/2017"  . Drug use: Not Currently    Types: Cocaine    Comment: 01/08/2018 "stopped 06/2017"  . Sexual activity: Yes    Birth control/protection: Condom    Comment: pt. declined condoms  Other Topics Concern  . Not on file  Social History Narrative  . Not on file   Social Determinants of Health   Financial Resource Strain:   . Difficulty of Paying Living Expenses:   Food Insecurity:   . Worried About Programme researcher, broadcasting/film/video in the Last Year:   . Barista in the Last Year:   Transportation Needs:   . Freight forwarder (Medical):   Marland Kitchen Lack of Transportation (Non-Medical):   Physical Activity:   . Days of Exercise per Week:   . Minutes of Exercise per Session:   Stress:   . Feeling of Stress :   Social  Connections:   . Frequency of Communication with Friends and Family:   . Frequency of Social Gatherings with Friends and Family:   . Attends Religious Services:   . Active Member of Clubs or Organizations:   . Attends Banker Meetings:   Marland Kitchen Marital Status:    Additional Social History:                         Sleep: Fair  Appetite:  Good  Current Medications: Current Facility-Administered Medications  Medication Dose Route Frequency Provider Last Rate Last Admin  . acetaminophen (TYLENOL) tablet 650 mg  650 mg Oral Q6H PRN Nira Conn A, NP   650 mg at 06/28/20 2357  . albuterol (VENTOLIN HFA) 108 (90 Base) MCG/ACT inhaler 1-2 puff  1-2 puff Inhalation Q6H PRN Antonieta Pert, MD      . alum & mag hydroxide-simeth (MAALOX/MYLANTA) 200-200-20 MG/5ML suspension 30 mL  30 mL Oral Q4H PRN Nira Conn A, NP      . bictegravir-emtricitabine-tenofovir AF (BIKTARVY) 50-200-25 MG per tablet 1 tablet  1 tablet Oral Daily Antonieta Pert, MD   1 tablet at 06/30/20 0729  . bumetanide (BUMEX) tablet 1 mg  1 mg Oral Daily Antonieta Pert, MD   1 mg at 06/30/20 0729  . buPROPion (WELLBUTRIN XL) 24 hr tablet 150 mg  150 mg Oral Daily Antonieta Pert, MD   150 mg at 06/30/20 0732  . eplerenone (INSPRA) tablet 12.5 mg  12.5 mg Oral Daily Cobos, Rockey Situ, MD   12.5 mg at 06/30/20 1007  . finasteride (PROSCAR) tablet 5 mg  5 mg Oral Daily Antonieta Pert, MD   5 mg at 06/30/20 1007  . hydrocortisone cream 1 %   Topical TID PRN Armandina Stammer I, NP      . hydrOXYzine (ATARAX/VISTARIL) tablet 25 mg  25 mg Oral QHS PRN Aldean Baker, NP   25 mg at 06/29/20 2116  . losartan (COZAAR) tablet 25 mg  25 mg Oral Daily Antonieta Pert, MD   25 mg at 06/30/20 1007  . magnesium hydroxide (MILK OF MAGNESIA) suspension 30 mL  30 mL Oral Daily PRN Nira Conn A, NP   30 mL at 06/26/20 2122  . melatonin tablet 10 mg  10 mg Oral QHS Aldean Baker, NP   10 mg at 06/29/20 2116  .  metoprolol succinate (TOPROL-XL) 24 hr tablet 25 mg  25 mg Oral Daily Antonieta Pert, MD   25 mg at 06/30/20 0729  . nicotine (NICODERM CQ - dosed in mg/24 hours) patch 14 mg  14 mg Transdermal Daily Cobos, Rockey Situ, MD   14 mg at 06/30/20 0731  . potassium chloride SA (KLOR-CON) CR tablet 20 mEq  20 mEq Oral Daily Antonieta Pert, MD   20 mEq at 06/30/20 0729  . rivaroxaban (XARELTO) tablet 20 mg  20 mg Oral Daily Antonieta Pert, MD   20 mg at 06/30/20 1007  . tamsulosin (FLOMAX) capsule 0.8 mg  0.8 mg Oral QPC supper Antonieta Pert, MD   0.8 mg at 06/29/20 1740  . traZODone (DESYREL) tablet 100 mg  100 mg Oral QHS PRN Cobos, Rockey Situ, MD   100 mg at 06/29/20 2116  . valACYclovir (VALTREX) tablet 500 mg  500 mg Oral Daily Antonieta Pert, MD   500 mg at 06/30/20 1007    Lab Results:  Results for orders placed or performed during the hospital encounter of 06/25/20 (from the past 48 hour(s))  T3, free     Status: None   Collection Time: 06/28/20  6:09 PM  Result Value Ref Range   T3, Free 3.4 2.0 - 4.4 pg/mL    Comment: (NOTE) Performed At: [facility] [address] [place], [place] [id number] Jolene Schimke MD [id number]   T4, free     Status: None   Collection Time: 06/28/20  6:09 PM  Result Value Ref Range   Free T4 0.68 0.61 - 1.12 ng/dL    Comment: (NOTE) Biotin ingestion may interfere with free T4 tests.  If the results are inconsistent with the TSH level, previous test results, or the clinical presentation, then consider biotin interference. If needed, order repeat testing after stopping biotin. Performed at [facility], [address] [place], [place] [address]   Basic metabolic panel     Status: Abnormal   Collection Time: 06/28/20  6:09 PM  Result Value Ref Range   Sodium 138 135 - 145 mmol/L   Potassium 4.4 3.5 - 5.1 mmol/L   Chloride 106 98 - 111 mmol/L   CO2 24 22 - 32 mmol/L   Glucose, Bld 137 (H) 70 - 99 mg/dL    Comment:  Glucose reference range applies only to samples taken after fasting for at least 8 hours.   BUN 22 (H) 6 - 20 mg/dL   Creatinine, Ser 5.27 (H) 0.61 - 1.24 mg/dL   Calcium 8.9 8.9 - 78.2 mg/dL   GFR calc non Af Amer 49 (L) >60 mL/min   GFR calc Af Amer 57 (L) >60 mL/min   Anion gap 8 5 - 15    Comment: Performed at [facility], [address] [place], [place] [address]    Blood Alcohol level:  Lab Results  Component Value Date   ETH <10 06/24/2020   ETH <10 02/25/2018    Metabolic Disorder Labs: Lab Results  Component Value Date   HGBA1C 5.7 (H) 06/25/2020   MPG 116.89 06/25/2020   MPG 105 09/24/2015   Lab Results  Component Value Date   PROLACTIN 32.7 (H) 08/08/2015   Lab Results  Component Value Date   CHOL 125 06/25/2020   TRIG 156 (H) 06/25/2020   HDL 51 06/25/2020   CHOLHDL 2.5 06/25/2020   VLDL 31 06/25/2020   LDLCALC 43 06/25/2020   LDLCALC 82 03/08/2020    Physical Findings: AIMS: Facial and Oral Movements Muscles of Facial Expression: None, normal Lips and Perioral Area: None, normal Jaw: None, normal Tongue: None, normal,Extremity Movements Upper (arms, wrists, hands, fingers): None, normal Lower (legs, knees, ankles, toes): None, normal, Trunk Movements Neck, shoulders, hips: None, normal, Overall Severity Severity of abnormal movements (highest score from questions above): None, normal Incapacitation due to abnormal movements: None, normal Patient's awareness of abnormal movements (rate only patient's report): No Awareness, Dental Status Current problems with teeth and/or dentures?: No Does patient usually wear dentures?: No  CIWA:  CIWA-Ar Total: 0 COWS:  COWS Total Score: 1  Musculoskeletal: Strength & Muscle Tone: within normal limits Gait & Station: normal Patient leans: N/A  Psychiatric Specialty Exam: Physical Exam Vitals and nursing note reviewed.  Constitutional:      Appearance: Normal appearance.  HENT:      Head: Normocephalic and atraumatic.  Pulmonary:     Effort: Pulmonary effort is normal.  Neurological:     General: No focal deficit present.     Mental Status: He is alert and oriented to person, place, and time.     Review of Systems  Blood pressure (!) 145/121, pulse (!) 108, temperature (!) 97.5 F (36.4 C), temperature source Oral, resp. rate 20, height 5\' 9"  (1.753 m), weight 91.2 kg, SpO2 99 %.Body mass index is 29.68 kg/m.  General Appearance: Casual  Eye Contact:  Fair  Speech:  Normal Rate  Volume:  Normal  Mood:  Euthymic  Affect:  Congruent  Thought Process:  Coherent and Descriptions of Associations: Intact  Orientation:  Full (Time, Place, and Person)  Thought Content:  Logical  Suicidal Thoughts:  No  Homicidal Thoughts:  No  Memory:  Immediate;   Fair Recent;   Fair Remote;   Fair  Judgement:  Intact  Insight:  Fair  Psychomotor Activity:  Normal  Concentration:  Concentration: Fair and Attention Span: Fair  Recall:  [employer] of Knowledge:  Fair  Language:  Good  Akathisia:  Negative  Handed:  Right  AIMS (if indicated):     Assets:  Desire for Improvement Resilience  ADL's:  Intact  Cognition:  WNL  Sleep:  Number of Hours: 5.75     Treatment Plan Summary: Daily contact with patient to assess and evaluate symptoms and progress in treatment, Medication management and Plan : Patient is seen and examined.  Patient is a 59 year old male with the above-stated past psychiatric history who is seen in follow-up.   Diagnosis: 1.  Substance-induced mood disorder. 2.  Cocaine dependence 3.  Congestive heart failure. 4.  Essential hypertension. 5.  Elevated creatinine. 6.  HIV 7.  Type 2 diabetes mellitus 8.  Benign prostatic hypertrophy  Pertinent findings on examination today: 1.  No suicidal or homicidal ideation. 2.  No auditory or visual hallucinations. 3.  Patient has been accepted into [facility] and the hope is that it will be within 1 to 2  days. 3.  Elevated blood pressure  Plan: 1.  Continue Bumex 1 mg p.o. daily for hypertension and heart failure. 2.  Continue Wellbutrin XL 150 mg p.o. daily for depression. 3.  Continue Inspra 12.5 mg p.o. daily for heart failure. 4.  Continue Proscar 5 mg p.o. daily for benign prostatic hypertrophy. 5.  Increase Cozaar to 50 mg p.o. daily for hypertension and heart failure. 6.  Continue melatonin 10 mg p.o. nightly for insomnia. 7.  Continue Biktarvy 1 tablet p.o. daily for HIV disease. 8.  Continue Xarelto 20 mg p.o. daily for anticoagulation. 9.  Continue Flomax 0.8 mg p.o. nightly for benign prostatic hypertrophy. 10.  Increase trazodone 250 mg p.o. nightly as needed insomnia. 11.  Continue Valtrex 500 mg p.o. daily for prophylaxis for herpes. 11.  Disposition planning-patient has been accepted into [facility] and anticipate discharge in 1 to 2 days.  Antonieta Pert, MD 06/30/2020, 3:13 PM

## 2020-06-30 NOTE — [facility] Counselor (Signed)
CSW faxed MAR and notes to [facility]. CSW will continue to follow regarding possible placement.    Ruthann Cancer MSW, [employer] Clincal Social Worker  [facility]

## 2020-06-30 NOTE — Tx Team (Signed)
Interdisciplinary Treatment and Diagnostic Plan Update  06/30/2020 Time of Session: 9:35am Ryan Long MRN: [id number]  Principal Diagnosis: <principal problem not specified>  Secondary Diagnoses: Active Problems:   Human immunodeficiency virus (HIV) disease (HCC)   Recurrent HSV (herpes simplex virus)   Hyperlipidemia   Chronic systolic heart failure (HCC)   Tobacco use disorder   Hypertension   Chronic obstructive pulmonary disease (HCC)   Abnormal thyroid stimulating hormone (TSH) level   AICD (automatic cardioverter/defibrillator) present   Severe recurrent major depression with psychotic features (HCC)   Current Medications:  Current Facility-Administered Medications  Medication Dose Route Frequency Provider Last Rate Last Admin  . acetaminophen (TYLENOL) tablet 650 mg  650 mg Oral Q6H PRN Nira Conn A, NP   650 mg at 06/28/20 2357  . albuterol (VENTOLIN HFA) 108 (90 Base) MCG/ACT inhaler 1-2 puff  1-2 puff Inhalation Q6H PRN Antonieta Pert, MD      . alum & mag hydroxide-simeth (MAALOX/MYLANTA) 200-200-20 MG/5ML suspension 30 mL  30 mL Oral Q4H PRN Nira Conn A, NP      . bictegravir-emtricitabine-tenofovir AF (BIKTARVY) 50-200-25 MG per tablet 1 tablet  1 tablet Oral Daily Antonieta Pert, MD   1 tablet at 06/30/20 0729  . bumetanide (BUMEX) tablet 1 mg  1 mg Oral Daily Antonieta Pert, MD   1 mg at 06/30/20 0729  . buPROPion (WELLBUTRIN XL) 24 hr tablet 150 mg  150 mg Oral Daily Antonieta Pert, MD   150 mg at 06/30/20 0732  . eplerenone (INSPRA) tablet 12.5 mg  12.5 mg Oral Daily Cobos, Rockey Situ, MD   12.5 mg at 06/30/20 1007  . finasteride (PROSCAR) tablet 5 mg  5 mg Oral Daily Antonieta Pert, MD   5 mg at 06/30/20 1007  . hydrocortisone cream 1 %   Topical TID PRN Armandina Stammer I, NP      . hydrOXYzine (ATARAX/VISTARIL) tablet 25 mg  25 mg Oral QHS PRN Aldean Baker, NP   25 mg at 06/29/20 2116  . losartan (COZAAR) tablet 25 mg  25 mg Oral  Daily Antonieta Pert, MD   25 mg at 06/30/20 1007  . magnesium hydroxide (MILK OF MAGNESIA) suspension 30 mL  30 mL Oral Daily PRN Nira Conn A, NP   30 mL at 06/26/20 2122  . melatonin tablet 10 mg  10 mg Oral QHS Aldean Baker, NP   10 mg at 06/29/20 2116  . metoprolol succinate (TOPROL-XL) 24 hr tablet 25 mg  25 mg Oral Daily Antonieta Pert, MD   25 mg at 06/30/20 0729  . nicotine (NICODERM CQ - dosed in mg/24 hours) patch 14 mg  14 mg Transdermal Daily Cobos, Rockey Situ, MD   14 mg at 06/30/20 0731  . potassium chloride SA (KLOR-CON) CR tablet 20 mEq  20 mEq Oral Daily Antonieta Pert, MD   20 mEq at 06/30/20 0729  . rivaroxaban (XARELTO) tablet 20 mg  20 mg Oral Daily Antonieta Pert, MD   20 mg at 06/30/20 1007  . tamsulosin (FLOMAX) capsule 0.8 mg  0.8 mg Oral QPC supper Antonieta Pert, MD   0.8 mg at 06/29/20 1740  . traZODone (DESYREL) tablet 100 mg  100 mg Oral QHS PRN Cobos, Rockey Situ, MD   100 mg at 06/29/20 2116  . valACYclovir (VALTREX) tablet 500 mg  500 mg Oral Daily Antonieta Pert, MD   500 mg at 06/30/20 1007  PTA Medications: Medications Prior to Admission  Medication Sig Dispense Refill Last Dose  . bictegravir-emtricitabine-tenofovir AF (BIKTARVY) 50-200-25 MG TABS tablet Take 1 tablet by mouth daily. 30 tablet 11 06/24/2020 at Unknown time  . bumetanide (BUMEX) 1 MG tablet Take 1 mg by mouth daily.   Past Week at Unknown time  . buPROPion (WELLBUTRIN XL) 150 MG 24 hr tablet Take by mouth.   Past Week at Unknown time  . eplerenone (INSPRA) 25 MG tablet TAKE 1/2 TABLET(12.5 MG) BY MOUTH DAILY (Patient taking differently: Take 12.5 mg by mouth daily. 12.5mg ) 45 tablet 0 06/24/2020 at Unknown time  . finasteride (PROSCAR) 5 MG tablet Take 5 mg by mouth daily.   Past Week at Unknown time  . hydrOXYzine (VISTARIL) 25 MG capsule Take 1 capsule by mouth daily.   06/24/2020 at Unknown time  . losartan (COZAAR) 25 MG tablet Take 25 mg by mouth daily.   Past Week  at Unknown time  . Melatonin 10 MG TBDP 10 mg.   Past Week at Unknown time  . metoprolol succinate (TOPROL-XL) 25 MG 24 hr tablet Take 25 mg by mouth daily.   06/24/2020 at 1800  . potassium chloride SA (K-DUR,KLOR-CON) 20 MEQ tablet Take 1 tablet (20 mEq total) by mouth daily. 90 tablet 1 Past Week at Unknown time  . rivaroxaban (XARELTO) 20 MG TABS tablet Take 20 mg by mouth daily after supper.   06/24/2020 at 1800  . rosuvastatin (CRESTOR) 10 MG tablet Take 20 mg by mouth at bedtime.    06/24/2020 at Unknown time  . sildenafil (VIAGRA) 50 MG tablet Take 1 tablet (50 mg total) by mouth daily as needed for erectile dysfunction. At least 24 hours between doses 20 tablet 0 Past Month at Unknown time  . tamsulosin (FLOMAX) 0.4 MG CAPS capsule Take 0.8 mg by mouth daily.   Past Week at Unknown time  . traZODone (DESYREL) 100 MG tablet Take 100 mg by mouth at bedtime as needed for sleep.   Past Week at Unknown time  . valACYclovir (VALTREX) 500 MG tablet Take 1 tablet (500 mg total) by mouth daily. For Herpes. 30 tablet 1 Past Week at Unknown time  . VENTOLIN HFA 108 (90 Base) MCG/ACT inhaler INHALE 2 PUFFS INTO THE LUNGS EVERY 6 HOURS AS NEEDED FOR WHEEZING OR SHORTNESS OF BREATH 18 g 3 Past Week at Unknown time    Patient Stressors: Loss of housing Marital or family conflict Medication change or noncompliance  Patient Strengths: Geographical information systems officer for treatment/growth  Treatment Modalities: Medication Management, Group therapy, Case management,  1 to 1 session with clinician, Psychoeducation, Recreational therapy.   Physician Treatment Plan for Primary Diagnosis: <principal problem not specified> Long Term Goal(s): Improvement in symptoms so as ready for discharge Improvement in symptoms so as ready for discharge   Short Term Goals: Ability to identify changes in lifestyle to reduce recurrence of condition will improve Ability to verbalize feelings will improve Ability to  disclose and discuss suicidal ideas Ability to demonstrate self-control will improve Ability to identify and develop effective coping behaviors will improve Ability to maintain clinical measurements within normal limits will improve Compliance with prescribed medications will improve Ability to identify triggers associated with substance abuse/mental health issues will improve Ability to identify changes in lifestyle to reduce recurrence of condition will improve Ability to verbalize feelings will improve Ability to disclose and discuss suicidal ideas Ability to demonstrate self-control will improve Ability to identify and develop  effective coping behaviors will improve Ability to maintain clinical measurements within normal limits will improve Compliance with prescribed medications will improve Ability to identify triggers associated with substance abuse/mental health issues will improve  Medication Management: Evaluate patient's response, side effects, and tolerance of medication regimen.  Therapeutic Interventions: 1 to 1 sessions, Unit Group sessions and Medication administration.  Evaluation of Outcomes: Progressing  Physician Treatment Plan for Secondary Diagnosis: Active Problems:   Human immunodeficiency virus (HIV) disease (HCC)   Recurrent HSV (herpes simplex virus)   Hyperlipidemia   Chronic systolic heart failure (HCC)   Tobacco use disorder   Hypertension   Chronic obstructive pulmonary disease (HCC)   Abnormal thyroid stimulating hormone (TSH) level   AICD (automatic cardioverter/defibrillator) present   Severe recurrent major depression with psychotic features (HCC)  Long Term Goal(s): Improvement in symptoms so as ready for discharge Improvement in symptoms so as ready for discharge   Short Term Goals: Ability to identify changes in lifestyle to reduce recurrence of condition will improve Ability to verbalize feelings will improve Ability to disclose and discuss  suicidal ideas Ability to demonstrate self-control will improve Ability to identify and develop effective coping behaviors will improve Ability to maintain clinical measurements within normal limits will improve Compliance with prescribed medications will improve Ability to identify triggers associated with substance abuse/mental health issues will improve Ability to identify changes in lifestyle to reduce recurrence of condition will improve Ability to verbalize feelings will improve Ability to disclose and discuss suicidal ideas Ability to demonstrate self-control will improve Ability to identify and develop effective coping behaviors will improve Ability to maintain clinical measurements within normal limits will improve Compliance with prescribed medications will improve Ability to identify triggers associated with substance abuse/mental health issues will improve     Medication Management: Evaluate patient's response, side effects, and tolerance of medication regimen.  Therapeutic Interventions: 1 to 1 sessions, Unit Group sessions and Medication administration.  Evaluation of Outcomes: Progressing   RN Treatment Plan for Primary Diagnosis: <principal problem not specified> Long Term Goal(s): Knowledge of disease and therapeutic regimen to maintain health will improve  Short Term Goals: Ability to verbalize frustration and anger appropriately will improve, Ability to verbalize feelings will improve and Compliance with prescribed medications will improve  Medication Management: RN will administer medications as ordered by provider, will assess and evaluate patient's response and provide education to patient for prescribed medication. RN will report any adverse and/or side effects to prescribing provider.  Therapeutic Interventions: 1 on 1 counseling sessions, Psychoeducation, Medication administration, Evaluate responses to treatment, Monitor vital signs and CBGs as ordered,  Perform/monitor CIWA, COWS, AIMS and Fall Risk screenings as ordered, Perform wound care treatments as ordered.  Evaluation of Outcomes: Progressing   LCSW Treatment Plan for Primary Diagnosis: <principal problem not specified> Long Term Goal(s): Safe transition to appropriate next level of care at discharge, Engage patient in therapeutic group addressing interpersonal concerns.  Short Term Goals: Engage patient in aftercare planning with referrals and resources, Increase social support, Facilitate patient progression through stages of change regarding substance use diagnoses and concerns, Identify triggers associated with mental health/substance abuse issues and Increase skills for wellness and recovery  Therapeutic Interventions: Assess for all discharge needs, 1 to 1 time with Social worker, Explore available resources and support systems, Assess for adequacy in community support network, Educate family and significant other(s) on suicide prevention, Complete Psychosocial Assessment, Interpersonal group therapy.  Evaluation of Outcomes: Progressing   Progress in Treatment: Attending groups:  Yes. Participating in groups: Yes. Taking medication as prescribed: Yes. Toleration medication: Yes. Family/Significant other contact made: No, will contact:  patient declined consents. Patient understands diagnosis: Yes. Discussing patient identified problems/goals with staff: Yes. Medical problems stabilized or resolved: Yes. Denies suicidal/homicidal ideation: Yes. Issues/concerns per patient self-inventory: No.   New problem(s) identified: No, Describe:  no new problems  New Short Term/Long Term Goal(s):  Patient Goals:  Go to [facility]  Discharge Plan or Barriers: Possibly has bed placement at [facility].   Reason for Continuation of Hospitalization: Medication stabilization Withdrawal symptoms  Estimated Length of Stay: 1-3 days  Attendees: Patient:  06/30/2020   Physician:  06/30/2020    Nursing:  06/30/2020   RN Care Manager: 06/30/2020  Social Worker: Ruthann Cancer, LCSWA 06/30/2020   Recreational Therapist:  06/30/2020   Other:  06/30/2020  Other:  06/30/2020   Other: 06/30/2020     Scribe for Treatment Team: Otelia Santee, LCSWA 06/30/2020 3:19 PM

## 2020-06-30 NOTE — Progress Notes (Addendum)
   06/30/20 2030  Psych Admission Type (Psych Patients Only)  Admission Status Voluntary  Psychosocial Assessment  Patient Complaints Anxiety  Eye Contact Fair  Facial Expression Anxious  Affect Anxious  Speech Logical/coherent  Interaction Assertive  Motor Activity Slow  Appearance/Hygiene Unremarkable  Behavior Characteristics Cooperative;Anxious  Mood Anxious;Pleasant  Thought Process  Coherency WDL  Content WDL  Delusions None reported or observed  Perception WDL  Hallucination None reported or observed  Judgment Limited  Confusion None  Danger to Self  Current suicidal ideation? Denies  Self-Injurious Behavior No self-injurious ideation or behavior indicators observed or expressed   Danger to Others  Danger to Others None reported or observed   Pt rates anxiety 5/10 and HA pain 4/10. Pt anxious about getting a bed at a [facility] facility. Pt says he did the intake process over the phone for [facility] today but doesn't know if he has been accepted. "I may know something tomorrow morning. I just want to know yes or no so I can move on and try something else." Pt committed to getting into a recovery program.

## 2020-06-30 NOTE — Progress Notes (Signed)
Patient was continuously being rude to tech this morning. Tech spoke to him this morning and he said shout the hell up. Tech asked him to kindly line up for breakfast & the slipped her hand and said get the hell out of my way.

## 2020-06-30 NOTE — Plan of Care (Signed)
Progress note  D: pt found in bed; compliant with morning medications. Pt continues to discuss the incident with staff this morning. Pt shows little insight into their own actions. Pt placed on a UR for now. Pt also has complaints of a rash under their arms bilaterally. Pt instructed to d/c their deodorant use and medication was obtained from NP. Pt has been viewed in the dayroom interacting appropriately. Pt denies si/hi/ah/vh and verbally agrees to approach staff if these become apparent or before harming themself/others while at [facility].  A: Pt provided support and encouragement. Pt given medication per protocol and standing orders. Q60m safety checks implemented and continued.  R: Pt safe on the unit. Will continue to monitor.  Pt progressing in the following metrics  Problem: Activity: Goal: Sleeping patterns will improve Outcome: Progressing   Problem: Health Behavior/Discharge Planning: Goal: Compliance with treatment plan for underlying cause of condition will improve Outcome: Progressing   Problem: Physical Regulation: Goal: Ability to maintain clinical measurements within normal limits will improve Outcome: Progressing   Problem: Safety: Goal: Periods of time without injury will increase Outcome: Progressing

## 2020-07-01 MED ORDER — HYDROCORTISONE 1 % EX CREA: TOPICAL_CREAM | Freq: Three times a day (TID) | CUTANEOUS | 0 refills | Status: DC | PRN

## 2020-07-01 MED ORDER — BUPROPION HCL ER (XL) 150 MG PO TB24: 150.0000 mg | ORAL_TABLET | Freq: Every day | ORAL | 0 refills | Status: DC

## 2020-07-01 MED ORDER — BUMETANIDE 1 MG PO TABS: 1.0000 mg | ORAL_TABLET | Freq: Every day | ORAL | 0 refills | Status: DC

## 2020-07-01 MED ORDER — RIVAROXABAN 20 MG PO TABS: 20.0000 mg | ORAL_TABLET | Freq: Every day | ORAL | 0 refills | Status: DC

## 2020-07-01 MED ORDER — NICOTINE 14 MG/24HR TD PT24: 14.0000 mg | MEDICATED_PATCH | Freq: Every day | TRANSDERMAL | 0 refills | Status: DC

## 2020-07-01 MED ORDER — POTASSIUM CHLORIDE CRYS ER 20 MEQ PO TBCR: 20.0000 meq | EXTENDED_RELEASE_TABLET | Freq: Every day | ORAL | 0 refills | Status: DC

## 2020-07-01 MED ORDER — HYDROXYZINE HCL 25 MG PO TABS: 25.0000 mg | ORAL_TABLET | Freq: Every evening | ORAL | 0 refills | Status: DC | PRN

## 2020-07-01 MED ORDER — MELATONIN 10 MG PO TABS: 10.0000 mg | ORAL_TABLET | Freq: Every day | ORAL | 0 refills | Status: DC

## 2020-07-01 MED ORDER — TRAZODONE HCL 100 MG PO TABS: 100.0000 mg | ORAL_TABLET | Freq: Every evening | ORAL | 0 refills | Status: DC | PRN

## 2020-07-01 MED ORDER — METOPROLOL SUCCINATE ER 25 MG PO TB24: 25.0000 mg | ORAL_TABLET | Freq: Every day | ORAL | 0 refills | Status: DC

## 2020-07-01 MED ORDER — TAMSULOSIN HCL 0.4 MG PO CAPS: 0.8000 mg | ORAL_CAPSULE | Freq: Every day | ORAL | 0 refills | Status: DC

## 2020-07-01 MED ORDER — ALBUTEROL SULFATE HFA 108 (90 BASE) MCG/ACT IN AERS: 1.0000 | INHALATION_SPRAY | Freq: Four times a day (QID) | RESPIRATORY_TRACT | 0 refills | Status: DC | PRN

## 2020-07-01 MED ORDER — LOSARTAN POTASSIUM 50 MG PO TABS: 50.0000 mg | ORAL_TABLET | Freq: Every day | ORAL | 0 refills | Status: DC

## 2020-07-01 NOTE — Progress Notes (Signed)
  [facility] Adult Case Management Discharge Plan :  Will you be returning to the same living situation after discharge:  No. Will be going to [facility] for inpatient rehab. At discharge, do you have transportation home?: Yes,  [facility] staff to pick patient up. Do you have the ability to pay for your medications: Yes,  has insurance.   Release of information consent forms completed and in the chart;  Patient's signature needed at discharge.  Patient to Follow up at:  Follow-up Information    [facility] Go on 07/01/2020.   Specialty: Addiction Medicine Why: You have been accepted for this residential treatment facility.  They will pick you up at this hospital at 11:30 am on 07/01/20. Contact information: [address] [place] [address] [phone]               Next level of care provider has access to [facility] Link:no  Safety Planning and Suicide Prevention discussed: Yes,  with patient.  Have you used any form of tobacco in the last 30 days? (Cigarettes, Smokeless Tobacco, Cigars, and/or Pipes): Yes  Has patient been referred to the Quitline?: Patient refused referral  Patient has been referred for addiction treatment: Yes  Otelia Santee, LCSWA 07/01/2020, 10:09 AM

## 2020-07-01 NOTE — [facility] Suicide Risk Assessment (Signed)
[facility] Discharge Suicide Risk Assessment   Principal Problem: <principal problem not specified> Discharge Diagnoses: Active Problems:   Human immunodeficiency virus (HIV) disease (HCC)   Recurrent HSV (herpes simplex virus)   Hyperlipidemia   Chronic systolic heart failure (HCC)   Tobacco use disorder   Hypertension   Chronic obstructive pulmonary disease (HCC)   Abnormal thyroid stimulating hormone (TSH) level   AICD (automatic cardioverter/defibrillator) present   Severe recurrent major depression with psychotic features (HCC)   Total Time spent with patient: 15 minutes  Musculoskeletal: Strength & Muscle Tone: within normal limits Gait & Station: normal Patient leans: N/A  Psychiatric Specialty Exam: Review of Systems  All other systems reviewed and are negative.   Blood pressure (!) 86/75, pulse 97, temperature (!) 97.5 F (36.4 C), temperature source Oral, resp. rate 18, height 5\' 9"  (1.753 m), weight 91.2 kg, SpO2 98 %.Body mass index is 29.68 kg/m.  General Appearance: Casual  Eye Contact::  Fair  Speech:  Normal Rate409  Volume:  Normal  Mood:  Euthymic  Affect:  Congruent  Thought Process:  Coherent and Descriptions of Associations: Intact  Orientation:  Full (Time, Place, and Person)  Thought Content:  Logical  Suicidal Thoughts:  No  Homicidal Thoughts:  No  Memory:  Immediate;   Fair Recent;   Fair Remote;   Fair  Judgement:  Intact  Insight:  Fair  Psychomotor Activity:  Normal  Concentration:  Good  Recall:  Good  Fund of Knowledge:Good  Language: Good  Akathisia:  Negative  Handed:  Right  AIMS (if indicated):     Assets:  Desire for Improvement Resilience  Sleep:  Number of Hours: 5.5  Cognition: WNL  ADL's:  Intact   Mental Status Per Nursing Assessment::   On Admission:  Suicidal ideation indicated by patient, Self-harm thoughts  Demographic Factors:  Male, Divorced or widowed, Low socioeconomic status, Living alone and  Unemployed  Loss Factors: Decline in physical health  Historical Factors: Impulsivity  Risk Reduction Factors:   Positive coping skills or problem solving skills  Continued Clinical Symptoms:  Depression:   Comorbid alcohol abuse/dependence Impulsivity Alcohol/Substance Abuse/Dependencies  Cognitive Features That Contribute To Risk:  None    Suicide Risk:  Minimal: No identifiable suicidal ideation.  Patients presenting with no risk factors but with morbid ruminations; may be classified as minimal risk based on the severity of the depressive symptoms   Follow-up Information    [facility] Go on 07/01/2020.   Specialty: Addiction Medicine Why: You have been accepted for this residential treatment facility.  They will pick you up at this hospital at 11:30 am on 07/01/20. Contact information: [address] [place] [phone]               Plan Of Care/Follow-up recommendations:  Activity:  ad lib  [phone], MD 07/01/2020, 11:33 AM

## 2020-07-01 NOTE — Plan of Care (Signed)
Discharge note  Patient verbalizes readiness for discharge. Follow up plan explained, AVS, Transition record and SRA given. Prescriptions and teaching provided. Belongings returned and signed for. Suicide safety plan completed and signed. Patient verbalizes understanding. Patient denies SI/HI and assures this Clinical research associate they will seek assistance should that change. Patient discharged to lobby where ride was waiting.  Problem: Education: Goal: Knowledge of [facility] Education information/materials will improve Outcome: Adequate for Discharge Goal: Emotional status will improve Outcome: Adequate for Discharge Goal: Mental status will improve Outcome: Adequate for Discharge Goal: Verbalization of understanding the information provided will improve Outcome: Adequate for Discharge   Problem: Activity: Goal: Interest or engagement in activities will improve Outcome: Adequate for Discharge Goal: Sleeping patterns will improve Outcome: Adequate for Discharge   Problem: Coping: Goal: Ability to verbalize frustrations and anger appropriately will improve Outcome: Adequate for Discharge Goal: Ability to demonstrate self-control will improve Outcome: Adequate for Discharge   Problem: Health Behavior/Discharge Planning: Goal: Identification of resources available to assist in meeting health care needs will improve Outcome: Adequate for Discharge Goal: Compliance with treatment plan for underlying cause of condition will improve Outcome: Adequate for Discharge   Problem: Physical Regulation: Goal: Ability to maintain clinical measurements within normal limits will improve Outcome: Adequate for Discharge   Problem: Safety: Goal: Periods of time without injury will increase Outcome: Adequate for Discharge   Problem: Education: Goal: Ability to make informed decisions regarding treatment will improve Outcome: Adequate for Discharge   Problem: Coping: Goal: Coping ability will  improve Outcome: Adequate for Discharge   Problem: Health Behavior/Discharge Planning: Goal: Identification of resources available to assist in meeting health care needs will improve Outcome: Adequate for Discharge   Problem: Medication: Goal: Compliance with prescribed medication regimen will improve Outcome: Adequate for Discharge   Problem: Self-Concept: Goal: Ability to disclose and discuss suicidal ideas will improve Outcome: Adequate for Discharge Goal: Will verbalize positive feelings about self Outcome: Adequate for Discharge   Problem: Education: Goal: Knowledge of disease or condition will improve Outcome: Adequate for Discharge Goal: Understanding of discharge needs will improve Outcome: Adequate for Discharge   Problem: Health Behavior/Discharge Planning: Goal: Ability to identify changes in lifestyle to reduce recurrence of condition will improve Outcome: Adequate for Discharge Goal: Identification of resources available to assist in meeting health care needs will improve Outcome: Adequate for Discharge   Problem: Physical Regulation: Goal: Complications related to the disease process, condition or treatment will be avoided or minimized Outcome: Adequate for Discharge   Problem: Safety: Goal: Ability to remain free from injury will improve Outcome: Adequate for Discharge

## 2020-07-01 NOTE — Progress Notes (Signed)
Chaplain rounding for follow up prior to D/C.  Cederic asleep in room.

## 2020-07-01 NOTE — Discharge Summary (Signed)
Physician Discharge Summary Note  Patient:  Ryan Long is an 59 y.o., male MRN:  [id number] DOB:  April 15, 1961 Patient phone:  [phone] (home)  Patient address:   [address] [place] [address],  Total Time spent with patient: Greater than 30 minutes  Date of Admission:  06/25/2020 Date of Discharge: 07-01-20  Reason for Admission: Suicidal ideations with visual hallucinations.  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 Active smoker   . AICD (automatic cardioverter/defibrillator) present   . Alcohol abuse   . Anxiety   . AR (allergic rhinitis)   . Bipolar 1 disorder (HCC)   . CHF (congestive heart failure) (HCC)   . Chronic bronchitis (HCC)   . Chronic systolic heart failure (HCC)   . COPD (chronic obstructive pulmonary disease) (HCC)   . Cough 12/31/2018  . Crack cocaine use   . Depression   . Genital herpes   . HIV (human immunodeficiency virus infection) (HCC) dx'd 08/1993  . HLD (hyperlipidemia)   . Hypertension   . NICM (nonischemic cardiomyopathy) (HCC)    Echocardiogram 06/28/11: EF 30-35%, mild MR, mild LAE;  No CAD by coronary CT angiogram 3/12 at [facility]  . NSVT (nonsustained ventricular tachycardia) (HCC)   . PTSD (post-traumatic  stress disorder)     Past Surgical History:  Procedure Laterality Date  . CARDIAC DEFIBRILLATOR PLACEMENT  01/08/2018  . ICD IMPLANT N/A 01/08/2018   Procedure: ICD IMPLANT;  Surgeon: Regan Lemming, MD;  Location: [facility] INVASIVE CV LAB;  Service: Cardiovascular;  Laterality: N/A;   Family History:  Family History  Problem Relation Age of Onset  . Glaucoma Mother   . Mental illness Mother   . Vision loss Mother   . Hypertension Father   . CAD Father   . Mental illness Father   . Alcohol abuse Father   . Drug abuse Father   . Heart attack Father 56  . Early death Father   . Alcohol abuse Brother   . Drug abuse Brother   . Drug abuse Brother    Family Psychiatric  History: See H&P Social History:  Social History   Substance and Sexual Activity  Alcohol Use Not Currently  . Alcohol/week: 6.0 standard drinks  . Types: 6 Cans of beer per week   Comment: 01/08/2018 "stopped 06/2017"     Social History   Substance and Sexual Activity  Drug Use Not Currently  . Types: Cocaine   Comment: 01/08/2018 "stopped 06/2017"    Social History   Socioeconomic History  . Marital status: Single    Spouse name: Not on file  . Number of children: Not on file  . Years of education: Not on file  . Highest education level: Not on file  Occupational History  . Not on file  Tobacco Use  . Smoking status: Current Every Day Smoker  Packs/day: 0.75    Years: 38.00    Pack years: 28.50    Types: Cigarettes  . Smokeless tobacco: Never Used  . Tobacco comment: hoping welbutrin will help  Vaping Use  . Vaping Use: Never used  Substance and Sexual Activity  . Alcohol use: Not Currently    Alcohol/week: 6.0 standard drinks    Types: 6 Cans of beer per week    Comment: 01/08/2018 "stopped 06/2017"  . Drug use: Not Currently    Types: Cocaine    Comment: 01/08/2018 "stopped 06/2017"  . Sexual activity: Yes    Birth control/protection: Condom    Comment: pt. declined condoms  Other  Topics Concern  . Not on file  Social History Narrative  . Not on file   Social Determinants of Health   Financial Resource Strain:   . Difficulty of Paying Living Expenses:   Food Insecurity:   . Worried About Programme researcher, broadcasting/film/video in the Last Year:   . Barista in the Last Year:   Transportation Needs:   . Freight forwarder (Medical):   Marland Kitchen Lack of Transportation (Non-Medical):   Physical Activity:   . Days of Exercise per Week:   . Minutes of Exercise per Session:   Stress:   . Feeling of Stress :   Social Connections:   . Frequency of Communication with Friends and Family:   . Frequency of Social Gatherings with Friends and Family:   . Attends Religious Services:   . Active Member of Clubs or Organizations:   . Attends Banker Meetings:   Marland Kitchen Marital Status:    Hospital Course: (Per Md's admission evaluation notes): Patient is seen and examined. Patient is a 59 year old male with a past psychiatric history significant for cocaine dependence, alcohol dependence, posttraumatic stress disorder and depression who presented to the [facility] emergency department on 06/25/2020 with visual hallucinations and suicidal ideation. The patient stated that over the last month or so his life had really turned far worse. He stated that he was unable to control his cocaine and alcohol use. He denied any previous admissions for detox or rehabilitation. He stated he had contacted [facility] the day prior to admission and they recommended that he go to the emergency room for detoxification. He stated that he wanted to go to [facility] because he would be able to have housing there, and then after completing the program would be able to get into some form of a halfway house. He stated that he was sick and tired of living the drug for life. He had been living in a boardinghouse in the [place] area, but the population there worsened, there were lots of drugs and alcohol.  Over the last 2 months he has been migrating around the state attempting to find a place that he could avoid drugs and alcohol. He actually had a hospital admission for his congestive heart failure at [facility], and also had a medical hospitalization in [place]. He stated that his automated defibrillator had gone off twice, and that was because of his cocaine use. He also stated that his heart failure had worsened due to the cocaine, and he had to switch from furosemide to Bumex to control his fluid accumulation. He also stated that the drug use has prevented him from being compliant with his HIV medications. He stated that he needed to get help now so he could continue to live. He was admitted to the hospital for  evaluation and stabilization. He does have a significant past medical history for congestive heart failure, coronary artery disease, chronic anticoagulation, HIV disease, COPD, BPH and hypertension. His psychiatric medications include Wellbutrin XL 150 mg p.o. daily. He denied previous history of seizures, auditory or visual hallucinations or tremors. His psychotic symptoms appear to be secondary to the cocaine usage.  After the above admission evaluation, Nolan's presenting symptoms were noted. He was recommended for mood stabilization treatments. The medication regimen targeting those presenting symptoms were discussed with him & initiated with his consent. He was medicated, stabilized & discharged on the medications as listed on his discharge medication lists below. Besides the mood stabilization treatments, He was also enrolled & participated in the group counseling sessions being offered & held on this unit. He learned coping skills. He also presented other significant pre-existing medical issues that required treatment/monitoring. He was resumed & discharged on all his pertinent home medications for those health issues. He tolerated his treatment regimen without any adverse  effects or reactions reported. Part of his discharged plan was a referral & appointment to the [facility] to continue further substance abuse treatment after discharged.  During the course of his hospitalization, the 15-minute checks were adequate to ensure Khaidyn's safety.  Patient did not display any dangerous violent or suicidal behavior on the unit.  He interacted with patients & staff appropriately, participated appropriately in the group sessions/therapies. His medications were addressed & adjusted to meet his needs. He was recommended for outpatient follow-up care & medication management upon discharge to assure continuity of care & mood stability.  He was also discharged to go to the [facility] for further substance abuse treatment. At the time of discharge patient is not reporting any acute suicidal/homicidal ideations. He feels more confident about his self-care & in managing the suicidal ideations. He currently denies any new issues or concerns. Education and supportive counseling provided throughout his hospital stay & upon discharge.  Today upon his discharge evaluation with the attending psychiatrist, Abdel shares he is doing well. He denies any other specific concerns. He is sleeping well. His appetite is good. He denies other physical complaints. He denies AH/VH. He feels that his medications have been helpful & is in agreement to continue his current treatment regimen. He was able to engage in safety planning including plan to return to [facility] or contact emergency services if he feels unable to maintain his own safety or the safety of others. Pt had no further questions, comments, or concerns. He left [facility] with all personal belongings in no apparent distress. Transportation per [employer].  Physical Findings: AIMS: Facial and Oral Movements Muscles of Facial Expression: None, normal Lips and Perioral Area: None, normal Jaw: None, normal Tongue: None, normal,Extremity Movements Upper (arms, wrists, hands,  fingers): None, normal Lower (legs, knees, ankles, toes): None, normal, Trunk Movements Neck, shoulders, hips: None, normal, Overall Severity Severity of abnormal movements (highest score from questions above): None, normal Incapacitation due to abnormal movements: None, normal Patient's awareness of abnormal movements (rate only patient's report): No Awareness, Dental Status Current problems with teeth and/or dentures?: No Does patient usually wear dentures?: No  CIWA:  CIWA-Ar Total: 0 COWS:  COWS Total Score: 1  Musculoskeletal: Strength & Muscle Tone: within normal limits Gait & Station: normal Patient leans: N/A  Psychiatric Specialty Exam: Physical Exam Vitals and nursing note reviewed.  HENT:     Head: Normocephalic.     Nose: Nose normal.     Mouth/Throat:  Pharynx: Oropharynx is clear.  Eyes:     Pupils: Pupils are equal, round, and reactive to light.  Neck:     Comments: Deferred Cardiovascular:     Rate and Rhythm: Normal rate and regular rhythm.     Pulses: Normal pulses.     Comments: Hx. HTN Pulmonary:     Effort: Pulmonary effort is normal.  Genitourinary:    Comments: Hx. BPH Musculoskeletal:        General: Normal range of motion.     Cervical back: Normal range of motion.  Skin:    General: Skin is warm and dry.  Neurological:     Mental Status: He is alert and oriented to person, place, and time.     Review of Systems  Constitutional: Negative for chills, diaphoresis and fever.  HENT: Negative for congestion, rhinorrhea, sneezing and sore throat.   Eyes: Negative for discharge.  Respiratory: Negative for cough, chest tightness, shortness of breath and wheezing.   Cardiovascular: Negative for chest pain and palpitations.  Gastrointestinal: Negative for diarrhea, nausea and vomiting.  Endocrine: Negative for cold intolerance.  Genitourinary: Negative for difficulty urinating.  Musculoskeletal: Negative for arthralgias and myalgias.   Allergic/Immunologic:       Allergies: NKDA  Neurological: Negative for dizziness, tremors, seizures, syncope, numbness and headaches.  Psychiatric/Behavioral: Positive for dysphoric mood (Stabilized with medication prior to discharge) and sleep disturbance (Stabilized with medication prior to discharge). Negative for agitation, behavioral problems, confusion, decreased concentration, hallucinations, self-injury and suicidal ideas. The patient is not nervous/anxious (Stabilized with medication prior to discharge) and is not hyperactive.     Blood pressure (!) 86/75, pulse 97, temperature (!) 97.5 F (36.4 C), temperature source Oral, resp. rate 18, height 5\' 9"  (1.753 m), weight 91.2 kg, SpO2 98 %.Body mass index is 29.68 kg/m.  See Md's discharge SRA  Sleep:  Number of Hours: 5.5   Have you used any form of tobacco in the last 30 days? (Cigarettes, Smokeless Tobacco, Cigars, and/or Pipes): Yes  Has this patient used any form of tobacco in the last 30 days? (Cigarettes, Smokeless Tobacco, Cigars, and/or Pipes): N/A  Blood Alcohol level:  Lab Results  Component Value Date   ETH <10 06/24/2020   ETH <10 02/25/2018   Metabolic Disorder Labs:  Lab Results  Component Value Date   HGBA1C 5.7 (H) 06/25/2020   MPG 116.89 06/25/2020   MPG 105 09/24/2015   Lab Results  Component Value Date   PROLACTIN 32.7 (H) 08/08/2015   Lab Results  Component Value Date   CHOL 125 06/25/2020   TRIG 156 (H) 06/25/2020   HDL 51 06/25/2020   CHOLHDL 2.5 06/25/2020   VLDL 31 06/25/2020   LDLCALC 43 06/25/2020   LDLCALC 82 03/08/2020   See Psychiatric Specialty Exam and Suicide Risk Assessment completed by Attending Physician prior to discharge.  Discharge destination:  [facility]  Is patient on multiple antipsychotic therapies at discharge:  No   Has Patient had three or more failed trials of antipsychotic monotherapy by history:  No  Recommended Plan for Multiple Antipsychotic  Therapies: NA  Allergies as of 07/01/2020   No Known Allergies     Medication List    STOP taking these medications   hydrOXYzine 25 MG capsule Commonly known as: VISTARIL   Melatonin 10 MG Tbdp Replaced by: Melatonin 10 MG Tabs   rosuvastatin 10 MG tablet Commonly known as: CRESTOR   sildenafil 50 MG tablet Commonly known as: VIAGRA  TAKE these medications     Indication  albuterol 108 (90 Base) MCG/ACT inhaler Commonly known as: VENTOLIN HFA Inhale 1-2 puffs into the lungs every 6 (six) hours as needed for wheezing or shortness of breath. What changed:   how much to take  how to take this  when to take this  reasons to take this  additional instructions  Indication: Asthma   Biktarvy 50-200-25 MG Tabs tablet Generic drug: bictegravir-emtricitabine-tenofovir AF Take 1 tablet by mouth daily.  Indication: HIV Disease   bumetanide 1 MG tablet Commonly known as: BUMEX Take 1 tablet (1 mg total) by mouth daily. For swelling What changed: additional instructions  Indication: Edema   buPROPion 150 MG 24 hr tablet Commonly known as: WELLBUTRIN XL Take 1 tablet (150 mg total) by mouth daily. For depression Start taking on: July 02, 2020 What changed:   how much to take  when to take this  additional instructions  Indication: Major Depressive Disorder   eplerenone 25 MG tablet Commonly known as: INSPRA TAKE 1/2 TABLET(12.5 MG) BY MOUTH DAILY What changed: See the new instructions.  Indication: High Blood Pressure Disorder   finasteride 5 MG tablet Commonly known as: PROSCAR Take 5 mg by mouth daily.  Indication: Benign Enlargement of Prostate   hydrocortisone cream 1 % Apply topically 3 (three) times daily as needed for itching.  Indication: Itching   hydrOXYzine 25 MG tablet Commonly known as: ATARAX/VISTARIL Take 1 tablet (25 mg total) by mouth at bedtime as needed for anxiety (insomnia).  Indication: Feeling Anxious   losartan 50 MG  tablet Commonly known as: COZAAR Take 1 tablet (50 mg total) by mouth daily. For high blood pressure Start taking on: July 02, 2020 What changed:   medication strength  how much to take  additional instructions  Indication: High Blood Pressure Disorder   Melatonin 10 MG Tabs Take 10 mg by mouth at bedtime. For sleep Replaces: Melatonin 10 MG Tbdp  Indication: Trouble Sleeping   metoprolol succinate 25 MG 24 hr tablet Commonly known as: TOPROL-XL Take 1 tablet (25 mg total) by mouth daily. For high blood pressure Start taking on: July 02, 2020 What changed: additional instructions  Indication: High Blood Pressure Disorder   nicotine 14 mg/24hr patch Commonly known as: NICODERM CQ - dosed in mg/24 hours Place 1 patch (14 mg total) onto the skin daily. (May buy from over the counter): For smoking cessation Start taking on: July 02, 2020  Indication: Nicotine Addiction   potassium chloride SA 20 MEQ tablet Commonly known as: KLOR-CON Take 1 tablet (20 mEq total) by mouth daily. For low potassium What changed: additional instructions  Indication: Low Amount of Potassium in the Blood   rivaroxaban 20 MG Tabs tablet Commonly known as: XARELTO Take 20 mg by mouth daily after supper. What changed: Another medication with the same name was added. Make sure you understand how and when to take each.  Indication: Blood Clot in a Deep Vein   rivaroxaban 20 MG Tabs tablet Commonly known as: XARELTO Take 1 tablet (20 mg total) by mouth daily. For DVT Start taking on: July 02, 2020 What changed: You were already taking a medication with the same name, and this prescription was added. Make sure you understand how and when to take each.  Indication: Blood Clot in a Deep Vein   tamsulosin 0.4 MG Caps capsule Commonly known as: FLOMAX Take 2 capsules (0.8 mg total) by mouth daily after supper. For Prostate health What  changed:   when to take this  additional instructions   Indication: Benign Enlargement of Prostate   traZODone 100 MG tablet Commonly known as: DESYREL Take 1 tablet (100 mg total) by mouth at bedtime as needed for sleep.  Indication: Trouble Sleeping   valACYclovir 500 MG tablet Commonly known as: VALTREX Take 1 tablet (500 mg total) by mouth daily. For Herpes.  Indication: Herpes Simplex Infection, Herpes       Follow-up Information    [facility] Go on 07/01/2020.   Specialty: Addiction Medicine Why: You have been accepted for this residential treatment facility.  They will pick you up at this hospital at 11:30 am on 07/01/20. Contact information: [address] [place] [address] [phone]              Follow-up recommendations: Activity:  As tolerated Diet: As recommended by your primary care doctor. Keep all scheduled follow-up appointments as recommended.   Comments: Prescriptions given at discharge.  Patient agreeable to plan.  Given opportunity to ask questions.  Appears to feel comfortable with discharge denies any current suicidal or homicidal thought. Patient is also instructed prior to discharge to: Take all medications as prescribed by his/her mental healthcare provider. Report any adverse effects and or reactions from the medicines to his/her outpatient provider promptly. Patient has been instructed & cautioned: To not engage in alcohol and or illegal drug use while on prescription medicines. In the event of worsening symptoms, patient is instructed to call the crisis hotline, 911 and or go to the nearest ED for appropriate evaluation and treatment of symptoms. To follow-up with his/her primary care provider for your other medical issues, concerns and or health care needs.  Signed: Armandina Stammer, NP, PMHNP, FNP-BC 07/01/2020, 1:42 PM

## 2020-07-01 NOTE — Progress Notes (Signed)
Patient ID: Ryan Long, male   DOB: August 06, 1961, 60 y.o.   MRN: [id number]  CSW was informed by administrative assistant that [facility] called and said pt would not be able to be admitted to due to becoming irate. CSW called [facility] and spoke to [place] admissions nurse. CSW was informed that pt became irate and non compliant upon arrival. She informed csw that [facility] staff made decision that pt would not be able to be admitted. She explained that pt is being transported back to [facility].

## 2020-07-01 NOTE — Progress Notes (Signed)
Pt did attend the evening wrap up group. Pt was attentive, sharing, and supportive. Positive thinking and positive change were discussed.

## 2020-07-12 DIAGNOSIS — Z21 Asymptomatic human immunodeficiency virus [HIV] infection status: Secondary | ICD-10-CM | POA: Insufficient documentation

## 2020-08-07 ENCOUNTER — Emergency Department (HOSPITAL_COMMUNITY)
Admission: EM | Admit: 2020-08-07 | Discharge: 2020-08-08 | Disposition: A | Discharge status: Home / Self Care | Payer: Medicare Other | Attending: Emergency Medicine | Admitting: Emergency Medicine

## 2020-08-07 ENCOUNTER — Encounter (HOSPITAL_COMMUNITY): Payer: Self-pay

## 2020-08-07 ENCOUNTER — Other Ambulatory Visit: Payer: Self-pay

## 2020-08-07 DIAGNOSIS — Z79899 Other long term (current) drug therapy: Secondary | ICD-10-CM | POA: Insufficient documentation

## 2020-08-07 DIAGNOSIS — J449 Chronic obstructive pulmonary disease, unspecified: Secondary | ICD-10-CM | POA: Insufficient documentation

## 2020-08-07 DIAGNOSIS — F17210 Nicotine dependence, cigarettes, uncomplicated: Secondary | ICD-10-CM | POA: Insufficient documentation

## 2020-08-07 DIAGNOSIS — Z202 Contact with and (suspected) exposure to infections with a predominantly sexual mode of transmission: Secondary | ICD-10-CM | POA: Diagnosis present

## 2020-08-07 DIAGNOSIS — I110 Hypertensive heart disease with heart failure: Secondary | ICD-10-CM | POA: Insufficient documentation

## 2020-08-07 DIAGNOSIS — I5022 Chronic systolic (congestive) heart failure: Secondary | ICD-10-CM | POA: Insufficient documentation

## 2020-08-07 LAB — URINALYSIS, ROUTINE W REFLEX MICROSCOPIC
Bilirubin Urine: NEGATIVE
Glucose, UA: NEGATIVE mg/dL
Hgb urine dipstick: NEGATIVE
Ketones, ur: NEGATIVE mg/dL
Leukocytes,Ua: NEGATIVE
Nitrite: NEGATIVE
Protein, ur: NEGATIVE mg/dL
Specific Gravity, Urine: 1.013 (ref 1.005–1.030)
pH: 5 (ref 5.0–8.0)

## 2020-08-07 MED ORDER — CEFTRIAXONE SODIUM 1 G IJ SOLR
500.0000 mg | Freq: Once | INTRAMUSCULAR | Status: AC
  Administered 2020-08-07: 500 mg via INTRAMUSCULAR
  Filled 2020-08-07: qty 10

## 2020-08-07 MED ORDER — DOXYCYCLINE HYCLATE 100 MG PO CAPS: 100.0000 mg | ORAL_CAPSULE | Freq: Two times a day (BID) | ORAL | 0 refills | Status: AC

## 2020-08-07 MED ORDER — STERILE WATER FOR INJECTION IJ SOLN
INTRAMUSCULAR | Status: AC
  Administered 2020-08-07: 10 mL
  Filled 2020-08-07: qty 10

## 2020-08-07 NOTE — ED Provider Notes (Signed)
[facility]-EMERGENCY DEPT Provider Note   CSN: [id number] Arrival date & time: 08/07/20  2025     History Chief Complaint  Patient presents with  . Exposure to STD    Ryan Long is a 59 y.o. male.  HPI   59 year old male with a history of AICD, bipolar 1, CHF, COPD, substance use, HIV, hyperlipidemia, hypertension, presents emergency department today for evaluation of STD exposure.  States that his girlfriend recently tested positive for an STD.  He is not sure which one.  He has been having some dysuria and penile discharge.  He also has some mild pain that radiates from the right inguinal area to the right testicle.  He denies any swelling redness or pain to the testicles.  Denies any rashes to the genital area.  No fevers or other systemic symptoms.  States his HIV is well controlled and his viral loads have been undetectable for many years.  Past Medical History:  Diagnosis Date  . Active smoker   . AICD (automatic cardioverter/defibrillator) present   . Alcohol abuse   . Anxiety   . AR (allergic rhinitis)   . Bipolar 1 disorder (HCC)   . CHF (congestive heart failure) (HCC)   . Chronic bronchitis (HCC)   . Chronic systolic heart failure (HCC)   . COPD (chronic obstructive pulmonary disease) (HCC)   . Cough 12/31/2018  . Crack cocaine use   . Depression   . Genital herpes   . HIV (human immunodeficiency virus infection) (HCC) dx'd 08/1993  . HLD (hyperlipidemia)   . Hypertension   . NICM (nonischemic cardiomyopathy) (HCC)    Echocardiogram 06/28/11: EF 30-35%, mild MR, mild LAE;  No CAD by coronary CT angiogram 3/12 at [facility]  . NSVT (nonsustained ventricular tachycardia) (HCC)   . PTSD (post-traumatic stress disorder)     Patient Active Problem List   Diagnosis Date Noted  . Severe recurrent major depression with psychotic features (HCC) 06/25/2020  . Cough 12/31/2018  . Controlled substance agreement signed  10/22/2018  . AICD (automatic cardioverter/defibrillator) present 08/15/2018  . Abnormal EKG 06/28/2018  . Cocaine abuse (HCC) 06/25/2018  . Encounter for other general examination 06/25/2018  . Current severe episode of major depressive disorder with psychotic features without prior episode (HCC) 06/24/2018  . MDD (major depressive disorder), recurrent episode, moderate (HCC) 06/06/2018  . Current moderate episode of major depressive disorder (HCC) 05/24/2018  . Adjustment disorder with mixed disturbance of emotions and conduct   . NICM (nonischemic cardiomyopathy) (HCC) 01/08/2018  . Suicidal ideation 12/25/2017  . Anxiety 12/20/2017  . Bipolar 1 disorder (HCC) 10/10/2017  . Breast lump 10/03/2017  . Chronic systolic heart failure (HCC) 05/13/2017  . Genital herpes 05/13/2017  . HSV-2 infection 07/17/2016  . 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 Nonpsychotic mental disorder 06/22/2016  . Screening examination for venereal disease 04/18/2016  . Encounter for long-term (current) use of medications 04/18/2016  . History of attempted suicide 04/14/2016  . History of alcoholism (HCC) 04/14/2016  . History of substance abuse (HCC) 04/14/2016  . Constipation 01/17/2016  . Homelessness 01/11/2016  . Major depressive disorder, recurrent, unspecified (HCC) 10/08/2015  . Chronic obstructive pulmonary disease (HCC) 10/04/2015  . Abnormal thyroid stimulating hormone (TSH) level 10/04/2015  . MDD (major depressive disorder), recurrent episode, severe (HCC) 09/23/2015  . Hypertension 09/10/2015  . MDD (major depressive disorder), recurrent, severe, with psychosis (HCC) 08/07/2015  . PTSD (post-traumatic stress  disorder) 08/07/2015  . Cocaine use disorder, severe, dependence (HCC) 08/07/2015  . Major depressive disorder, recurrent severe without psychotic features (HCC) 04/26/2015  . Primary insomnia 04/02/2015  . Herpesviral  infection of penis 04/02/2015  . Stimulant use disorder 05/29/2014  . Generalized anxiety disorder 05/29/2014  . Nondependent cannabis abuse, episodic 02/16/2014  . Cannabis abuse, uncomplicated 02/16/2014  . Polysubstance (excluding opioids) dependence with physiological dependence (HCC) 12/30/2013  . Substance induced mood disorder (HCC) 11/22/2013  . Tobacco use disorder 02/17/2013  . Cocaine abuse, episodic use (HCC) 03/20/2012    Class: Acute  . Alcohol dependence (HCC) 03/20/2012    Class: Acute  . Alcohol use disorder, severe, dependence (HCC) 03/20/2012  . Heart failure (HCC) 09/19/2011  . Neutropenia (HCC) 09/19/2011  . Alcohol use with alcohol-induced disorder (HCC) 09/19/2011  . Major depressive disorder, single episode, unspecified 09/17/2011  . Other primary cardiomyopathies 08/24/2011  . Human immunodeficiency virus (HIV) disease (HCC) 02/01/2009  . Recurrent HSV (herpes simplex virus) 02/01/2009  . Hyperlipidemia 02/01/2009  . Allergic rhinitis 02/01/2009    Past Surgical History:  Procedure Laterality Date  . CARDIAC DEFIBRILLATOR PLACEMENT  01/08/2018  . ICD IMPLANT N/A 01/08/2018   Procedure: ICD IMPLANT;  Surgeon: Regan Lemming, MD;  Location: [facility] INVASIVE CV LAB;  Service: Cardiovascular;  Laterality: N/A;       Family History  Problem Relation Age of Onset  . Glaucoma Mother   . Mental illness Mother   . Vision loss Mother   . Hypertension Father   . CAD Father   . Mental illness Father   . Alcohol abuse Father   . Drug abuse Father   . Heart attack Father 57  . Early death Father   . Alcohol abuse Brother   . Drug abuse Brother   . Drug abuse Brother     Social History   Tobacco Use  . Smoking status: Current Every Day Smoker    Packs/day: 0.75    Years: 38.00    Pack years: 28.50    Types: Cigarettes  . Smokeless tobacco: Never Used  . Tobacco comment: hoping welbutrin will help  Vaping Use  . Vaping Use: Never used  Substance  Use Topics  . Alcohol use: Not Currently    Alcohol/week: 6.0 standard drinks    Types: 6 Cans of beer per week    Comment: 01/08/2018 "stopped 06/2017"  . Drug use: Not Currently    Types: Cocaine    Comment: 01/08/2018 "stopped 06/2017"    Home Medications Prior to Admission medications   Medication Sig Start Date End Date Taking? Authorizing Provider  albuterol (VENTOLIN HFA) 108 (90 Base) MCG/ACT inhaler Inhale 1-2 puffs into the lungs every 6 (six) hours as needed for wheezing or shortness of breath. 07/01/20   Armandina Stammer I, NP  bictegravir-emtricitabine-tenofovir AF (BIKTARVY) 50-200-25 MG TABS tablet Take 1 tablet by mouth daily. 03/08/20   Randall Hiss, MD  bumetanide (BUMEX) 1 MG tablet Take 1 tablet (1 mg total) by mouth daily. For swelling 07/01/20   Armandina Stammer I, NP  buPROPion (WELLBUTRIN XL) 150 MG 24 hr tablet Take 1 tablet (150 mg total) by mouth daily. For depression 07/02/20   Armandina Stammer I, NP  doxycycline (VIBRAMYCIN) 100 MG capsule Take 1 capsule (100 mg total) by mouth 2 (two) times daily for 7 days. 08/07/20 08/14/20  Chrisha Vogel S, PA-C  eplerenone (INSPRA) 25 MG tablet TAKE 1/2 TABLET(12.5 MG) BY MOUTH DAILY Patient taking differently:  Take 12.5 mg by mouth daily. 12.5mg  02/10/19   Bensimhon, Bevelyn Buckles, MD  finasteride (PROSCAR) 5 MG tablet Take 5 mg by mouth daily. 02/26/20   [provider]  hydrocortisone cream 1 % Apply topically 3 (three) times daily as needed for itching. 07/01/20   Armandina Stammer I, NP  hydrOXYzine (ATARAX/VISTARIL) 25 MG tablet Take 1 tablet (25 mg total) by mouth at bedtime as needed for anxiety (insomnia). 07/01/20   Armandina Stammer I, NP  losartan (COZAAR) 50 MG tablet Take 1 tablet (50 mg total) by mouth daily. For high blood pressure 07/02/20   Nwoko, Nicole Kindred I, NP  melatonin 10 MG TABS Take 10 mg by mouth at bedtime. For sleep 07/01/20   Armandina Stammer I, NP  metoprolol succinate (TOPROL-XL) 25 MG 24 hr tablet Take 1 tablet (25 mg total)  by mouth daily. For high blood pressure 07/02/20   Nwoko, Nicole Kindred I, NP  nicotine (NICODERM CQ - DOSED IN MG/24 HOURS) 14 mg/24hr patch Place 1 patch (14 mg total) onto the skin daily. (May buy from over the counter): For smoking cessation 07/02/20   Armandina Stammer I, NP  potassium chloride SA (KLOR-CON) 20 MEQ tablet Take 1 tablet (20 mEq total) by mouth daily. For low potassium 07/01/20   Armandina Stammer I, NP  rivaroxaban (XARELTO) 20 MG TABS tablet Take 20 mg by mouth daily after supper. 06/10/20   [provider]  rivaroxaban (XARELTO) 20 MG TABS tablet Take 1 tablet (20 mg total) by mouth daily. For DVT 07/02/20   Armandina Stammer I, NP  tamsulosin (FLOMAX) 0.4 MG CAPS capsule Take 2 capsules (0.8 mg total) by mouth daily after supper. For Prostate health 07/01/20   Armandina Stammer I, NP  traZODone (DESYREL) 100 MG tablet Take 1 tablet (100 mg total) by mouth at bedtime as needed for sleep. 07/01/20   Armandina Stammer I, NP  valACYclovir (VALTREX) 500 MG tablet Take 1 tablet (500 mg total) by mouth daily. For Herpes. 03/17/19   Cain Saupe, MD    Allergies    Patient has no known allergies.  Review of Systems   Review of Systems  Constitutional: Negative for fever.  Genitourinary: Positive for discharge, dysuria and testicular pain. Negative for genital sores and scrotal swelling.  Skin: Negative for color change.    Physical Exam Updated Vital Signs BP (!) 131/94 (BP Location: Left Arm)   Pulse (!) 105   Temp 98.1 F (36.7 C) (Oral)   Resp 16   SpO2 96%   Physical Exam Constitutional:      General: He is not in acute distress.    Appearance: He is well-developed.  Eyes:     Conjunctiva/sclera: Conjunctivae normal.  Cardiovascular:     Rate and Rhythm: Normal rate and regular rhythm.  Pulmonary:     Effort: Pulmonary effort is normal.     Breath sounds: Normal breath sounds.  Abdominal:     Palpations: Abdomen is soft.     Tenderness: There is no abdominal tenderness. There is no  guarding.  Genitourinary:    Comments: Chaperone present. No obvious lesions to the penis. Penis is uncircumcised. No significant TTP to the testicles. No erythema or swelling noted to the testicles/scrotum. Skin:    General: Skin is warm and dry.  Neurological:     Mental Status: He is alert and oriented to person, place, and time.     ED Results / Procedures / Treatments   Labs (all labs ordered are  listed, but only abnormal results are displayed) Labs Reviewed  URINE CULTURE  URINALYSIS, ROUTINE W REFLEX MICROSCOPIC  RPR  GC/CHLAMYDIA PROBE AMP ([place]) NOT AT [facility]    EKG None  Radiology No results found.  Procedures Procedures (including critical care time)  Medications Ordered in ED Medications  cefTRIAXone (ROCEPHIN) injection 500 mg (500 mg Intramuscular Given 08/07/20 2151)  sterile water (preservative free) injection (10 mLs  Given 08/07/20 2152)    ED Course  I have reviewed the triage vital signs and the nursing notes.  Pertinent labs & imaging results that were available during my care of the patient were reviewed by me and considered in my medical decision making (see chart for details).    MDM Rules/Calculators/A&P                          Patient is afebrile without abdominal tenderness, abdominal pain or painful bowel movements to indicate prostatitis.  No tenderness to palpation of the testes or epididymis to suggest orchitis or epididymitis.  STD cultures obtained including syphilis, gonorrhea and chlamydia.  UA neg. Patient to be discharged with instructions to follow up with PCP. Discussed importance of using protection when sexually active. Pt understands that they have GC/Chlamydia cultures pending and that they will need to inform all sexual partners if results return positive. Patient has been treated prophylactically doxycycline and Rocephin.   Final Clinical Impression(s) / ED Diagnoses Final diagnoses:  STD exposure    Rx / DC  Orders ED Discharge Orders         Ordered    doxycycline (VIBRAMYCIN) 100 MG capsule  2 times daily        08/07/20 2129           Karrie Meres, PA-C 08/07/20 2226    Cathren Laine, MD 08/08/20 1555

## 2020-08-07 NOTE — ED Triage Notes (Signed)
Arrived POV from home. Patient reports his girlfriend told him that she has an STD. Patient reports testicular pain and some penile discharge

## 2020-08-07 NOTE — Discharge Instructions (Addendum)
You were given a prescription for antibiotics. Please take the antibiotic prescription fully.   You have been tested for syphilis, chlamydia and gonorrhea.  These results will be available in approximately 3-5 days and you will be contacted by the hospital if the results are positive. Avoid sexual contact until you are aware of the results, and please inform all sexual partners if you test positive for any of these diseases.  Please follow up with your primary care provider within 5-7 days for re-evaluation of your symptoms. If you do not have a primary care provider, information for a healthcare clinic has been provided for you to make arrangements for follow up care. Please return to the emergency department for any new or worsening symptoms.

## 2020-08-08 LAB — RPR: RPR Ser Ql: NONREACTIVE

## 2020-08-09 LAB — URINE CULTURE: Culture: <10000 — AB

## 2020-08-09 LAB — GC/CHLAMYDIA PROBE AMP (~~LOC~~) NOT AT ARMC
Chlamydia: NEGATIVE
Comment: NEGATIVE
Comment: NORMAL
Neisseria Gonorrhea: NEGATIVE

## 2020-08-25 DIAGNOSIS — I472 Ventricular tachycardia, unspecified: Secondary | ICD-10-CM | POA: Insufficient documentation

## 2020-09-17 ENCOUNTER — Telehealth: Payer: Self-pay | Admitting: Family Medicine

## 2020-09-17 NOTE — Telephone Encounter (Signed)
Pt says that he was previously seeing a provider in [place] that prescribed  Rovastatin 10 MG --1 tablet at bedtime and  potassium chloride SA (KLOR-CON) 20 MEQ tablet   Pt scheduled to see PCP, pt says that he will be out of his medication before his apt, pt would like to know if provider would send in Rx until his apt to avoid him running out?    Pt would like to have Rx sent to   [facility] pharmacy.    CB: [phone] assist pt further.

## 2020-09-20 NOTE — Telephone Encounter (Signed)
Authorization will need to come from his PCP.

## 2020-09-20 NOTE — Telephone Encounter (Signed)
He has an appt on 09/27/2020- since he is not technically under my care yet, he may want to see if his prior provider can given partial or full refill for 30 days or until he has his first appointment here. It appears that he saw his last PCP in August of this year

## 2020-09-27 ENCOUNTER — Telehealth: Payer: Self-pay | Admitting: Family Medicine

## 2020-09-27 ENCOUNTER — Other Ambulatory Visit: Payer: Self-pay | Admitting: Family Medicine

## 2020-09-27 ENCOUNTER — Encounter: Payer: Medicare Other | Admitting: Family Medicine

## 2020-09-27 MED ORDER — TAMSULOSIN HCL 0.4 MG PO CAPS: 0.8000 mg | ORAL_CAPSULE | Freq: Every day | ORAL | 0 refills | Status: DC

## 2020-09-27 MED ORDER — METOPROLOL SUCCINATE ER 25 MG PO TB24: 25.0000 mg | ORAL_TABLET | Freq: Every day | ORAL | 0 refills | Status: DC

## 2020-09-27 MED ORDER — TRAZODONE HCL 100 MG PO TABS: 100.0000 mg | ORAL_TABLET | Freq: Every evening | ORAL | 0 refills | Status: DC | PRN

## 2020-09-27 NOTE — Telephone Encounter (Signed)
Medication: metoprolol succinate (TOPROL-XL) 25 MG 24 hr tablet [[id number]] , traZODone (DESYREL) 100 MG tablet [[id number]], eplerenone (INSPRA) 25 MG tablet [[id number]], tamsulosin (FLOMAX) 0.4 MG CAPS capsule [[id number]] Apt Scheduled 10/11/20  Has the patient contacted their pharmacy? YES  (Agent: If no, request that the patient contact the pharmacy for the refill.) (Agent: If yes, when and what did the pharmacy advise?)  Preferred Pharmacy (with phone number or street name): [facility] - [place], [place] - [address] [facility]. [address] [place] [address] Phone: [phone] Fax: [phone] Hours: Not open 24 hours    Agent: Please be advised that RX refills may take up to 3 business days. We ask that you follow-up with your pharmacy.

## 2020-09-27 NOTE — Telephone Encounter (Signed)
Requested medication (s) are due for refill today: yes Metoprpolol Trazodone Epierenone Tamsulosin  Requested medication (s) are on the active medication list: yes, 10/11/20  Last refill: ?  Future visit scheduled: yes 10/11/20  Notes to clinic:  Historical providers; no valid encounter within last 6 months

## 2020-09-29 MED FILL — TAMSULOSIN HCL 0.4 MG CAP: 0.4 | 30 days supply | Qty: 60 | Fill #0

## 2020-09-29 MED FILL — traZODone HCL 100 MG TABS: 100 | 30 days supply | Qty: 30 | Fill #0

## 2020-09-29 MED FILL — METOPROLOL SUCCINATE ER 25: 25 | 30 days supply | Qty: 30 | Fill #0

## 2020-10-05 ENCOUNTER — Ambulatory Visit (HOSPITAL_COMMUNITY)
Admission: RE | Admit: 2020-10-05 | Discharge: 2020-10-05 | Disposition: A | Discharge status: Home / Self Care | Payer: Medicare Other | Source: Ambulatory Visit | Attending: Adult Health | Admitting: Adult Health

## 2020-10-05 ENCOUNTER — Other Ambulatory Visit: Payer: Self-pay

## 2020-10-05 ENCOUNTER — Encounter (HOSPITAL_COMMUNITY): Payer: Self-pay

## 2020-10-05 ENCOUNTER — Ambulatory Visit (INDEPENDENT_AMBULATORY_CARE_PROVIDER_SITE_OTHER): Payer: Medicare Other | Admitting: Cardiology

## 2020-10-05 ENCOUNTER — Encounter: Payer: Self-pay | Admitting: Cardiology

## 2020-10-05 VITALS — BP 102/80 | HR 112 | Ht 68.0 in | Wt 196.4 lb

## 2020-10-05 VITALS — BP 104/62 | HR 100 | Ht 68.0 in | Wt 197.2 lb

## 2020-10-05 DIAGNOSIS — I428 Other cardiomyopathies: Secondary | ICD-10-CM

## 2020-10-05 DIAGNOSIS — Z21 Asymptomatic human immunodeficiency virus [HIV] infection status: Secondary | ICD-10-CM | POA: Insufficient documentation

## 2020-10-05 DIAGNOSIS — E785 Hyperlipidemia, unspecified: Secondary | ICD-10-CM | POA: Diagnosis not present

## 2020-10-05 DIAGNOSIS — J449 Chronic obstructive pulmonary disease, unspecified: Secondary | ICD-10-CM | POA: Diagnosis not present

## 2020-10-05 DIAGNOSIS — Z7982 Long term (current) use of aspirin: Secondary | ICD-10-CM | POA: Diagnosis not present

## 2020-10-05 DIAGNOSIS — F419 Anxiety disorder, unspecified: Secondary | ICD-10-CM | POA: Insufficient documentation

## 2020-10-05 DIAGNOSIS — I110 Hypertensive heart disease with heart failure: Secondary | ICD-10-CM | POA: Diagnosis not present

## 2020-10-05 DIAGNOSIS — Z79899 Other long term (current) drug therapy: Secondary | ICD-10-CM | POA: Insufficient documentation

## 2020-10-05 DIAGNOSIS — I5022 Chronic systolic (congestive) heart failure: Secondary | ICD-10-CM | POA: Diagnosis present

## 2020-10-05 DIAGNOSIS — F319 Bipolar disorder, unspecified: Secondary | ICD-10-CM | POA: Insufficient documentation

## 2020-10-05 DIAGNOSIS — Z87891 Personal history of nicotine dependence: Secondary | ICD-10-CM | POA: Diagnosis not present

## 2020-10-05 DIAGNOSIS — Z95810 Presence of automatic (implantable) cardiac defibrillator: Secondary | ICD-10-CM | POA: Diagnosis not present

## 2020-10-05 LAB — BASIC METABOLIC PANEL
Anion gap: 8 (ref 5–15)
BUN: 10 mg/dL (ref 6–20)
CO2: 29 mmol/L (ref 22–32)
Calcium: 8.9 mg/dL (ref 8.9–10.3)
Chloride: 102 mmol/L (ref 98–111)
Creatinine, Ser: 1.18 mg/dL (ref 0.61–1.24)
GFR, Estimated: >60 mL/min (ref >60)
Glucose, Bld: 97 mg/dL (ref 70–99)
Potassium: 3.5 mmol/L (ref 3.5–5.1)
Sodium: 139 mmol/L (ref 135–145)

## 2020-10-05 MED ORDER — ENTRESTO 24-26 MG PO TABS
1.0000 | ORAL_TABLET | Freq: Two times a day (BID) | ORAL | 3 refills | Status: DC
Start: 2020-10-05 — End: 2021-01-05

## 2020-10-05 MED FILL — ENTRESTO 24 MG-26 MG TABLET: 24-26 | 30 days supply | Qty: 60 | Fill #0

## 2020-10-05 NOTE — Progress Notes (Signed)
Electrophysiology Office Note   Date:  10/05/2020   ID:  Ryan Long, DOB [birth year], MRN [id number]  PCP:  Cain Saupe, MD  Cardiologist:  Bensimhon Primary Electrophysiologist:  Afiya Ferrebee [name], MD    No chief complaint on file.    History of Present Illness: Ryan Long is a 59 y.o. male who is being seen today for the evaluation of CHF at the request of [name]. Presenting today for electrophysiology evaluation.  He has a history of tobacco abuse, alcohol abuse, bipolar one, nonischemic cardiomyopathy, COPD, HIV, hyperlipidemia, and hypertension.  He has an undetectable viral load.  Today, denies symptoms of palpitations, chest pain, shortness of breath, orthopnea, PND, lower extremity edema, claudication, dizziness, presyncope, syncope, bleeding, or neurologic sequela. The patient is tolerating medications without difficulties. Overall he is feeling well. He has no shortness of breath. He was seen in heart failure clinic this morning and was started on Entresto. He was living in [place] but has recently moved back to [place].  Past Medical History:  Diagnosis Date  . Active smoker   . AICD (automatic cardioverter/defibrillator) present   . Alcohol abuse   . Anxiety   . AR (allergic rhinitis)   . Bipolar 1 disorder (HCC)   . CHF (congestive heart failure) (HCC)   . Chronic bronchitis (HCC)   . Chronic systolic heart failure (HCC)   . COPD (chronic obstructive pulmonary disease) (HCC)   . Cough 12/31/2018  . Crack cocaine use   . Depression   . Genital herpes   . HIV (human immunodeficiency virus infection) (HCC) dx'd 08/1993  . HLD (hyperlipidemia)   . Hypertension   . NICM (nonischemic cardiomyopathy) (HCC)    Echocardiogram 06/28/11: EF 30-35%, mild MR, mild LAE;  No CAD by coronary CT [facility] 3/12 at [facility]  . NSVT (nonsustained ventricular tachycardia) (HCC)   . PTSD (post-traumatic stress disorder)    Past  Surgical History:  Procedure Laterality Date  . CARDIAC DEFIBRILLATOR PLACEMENT  01/08/2018  . ICD IMPLANT N/A 01/08/2018   Procedure: ICD IMPLANT;  Surgeon: Regan Lemming, MD;  Location: [facility] INVASIVE CV LAB;  Service: Cardiovascular;  Laterality: N/A;     Current Outpatient Medications  Medication Sig Dispense Refill  . albuterol (VENTOLIN HFA) 108 (90 Base) MCG/ACT inhaler Inhale 1-2 puffs into the lungs every 6 (six) hours as needed for wheezing or shortness of breath. 1 g 0  . aspirin EC 81 MG tablet Take 81 mg by mouth daily. Swallow whole.    . bictegravir-emtricitabine-tenofovir AF (BIKTARVY) 50-200-25 MG TABS tablet Take 1 tablet by mouth daily. 30 tablet 11  . eplerenone (INSPRA) 25 MG tablet TAKE 1/2 TABLET(12.5 MG) BY MOUTH DAILY 45 tablet 0  . hydrOXYzine (ATARAX/VISTARIL) 25 MG tablet Take 1 tablet (25 mg total) by mouth at bedtime as needed for anxiety (insomnia). 75 tablet 0  . ibuprofen (ADVIL) 200 MG tablet Take 200 mg by mouth every 6 (six) hours as needed.    . melatonin 5 MG TABS Take 5 mg by mouth at bedtime as needed.    . metoprolol succinate (TOPROL-XL) 25 MG 24 hr tablet Take 1 tablet (25 mg total) by mouth daily. For high blood pressure 30 tablet 0  . Multiple Vitamins-Minerals (MENS MULTIPLUS PO) Take by mouth.    . sacubitril-valsartan (ENTRESTO) 24-26 MG Take 1 tablet by mouth 2 (two) times daily. 60 tablet 3  . tamsulosin (FLOMAX) 0.4 MG CAPS capsule Take 2 capsules (0.8  mg total) by mouth daily after supper. For Prostate health 60 capsule 0  . traZODone (DESYREL) 100 MG tablet Take 1 tablet (100 mg total) by mouth at bedtime as needed for sleep. 30 tablet 0  . valACYclovir (VALTREX) 500 MG tablet Take 1 tablet (500 mg total) by mouth daily. For Herpes. 30 tablet 1   No current facility-administered medications for this visit.    Allergies:   Patient has no known allergies.   Social History:  The patient  reports that he has been smoking cigarettes. He  has a 28.50 pack-year smoking history. He has never used smokeless tobacco. He reports previous alcohol use of about 6.0 standard drinks of alcohol per week. He reports previous drug use. Drug: Cocaine.   Family History:  The patient's family history includes Alcohol abuse in his brother and father; CAD in his father; Drug abuse in his brother, brother, and father; Early death in his father; Glaucoma in his mother; Heart attack (age of onset: 54) in his father; Hypertension in his father; Mental illness in his father and mother; Vision loss in his mother.   ROS:  Please see the history of present illness.   Otherwise, review of systems is positive for none.   All other systems are reviewed and negative.   PHYSICAL EXAM: VS:  BP 102/80   Pulse (!) 112   Ht 5\' 8"  (1.727 m)   Wt 196 lb 6.4 oz (89.1 kg)   SpO2 97%   BMI 29.86 kg/m  , BMI Body mass index is 29.86 kg/m. GEN: Well nourished, well developed, in no acute distress  HEENT: normal  Neck: no JVD, carotid bruits, or masses Cardiac: RRR; no murmurs, rubs, or gallops,no edema  Respiratory:  clear to auscultation bilaterally, normal work of breathing GI: soft, nontender, nondistended, + BS MS: no deformity or atrophy  Skin: warm and dry, device site well healed Neuro:  Strength and sensation are intact Psych: euthymic mood, full affect  EKG:  EKG is not ordered today. Personal review of the ekg ordered 10/05/20 shows sinus tachycardia, right atrial enlargement, rate 111, PVCs  Personal review of the device interrogation today. Results in [facility]   Recent Labs: 06/24/2020: ALT 28; Hemoglobin 16.5; Platelets 166 06/25/2020: TSH 0.245 06/26/2020: B Natriuretic Peptide 179.8 10/05/2020: BUN 10; Creatinine, Ser 1.18; Potassium 3.5; Sodium 139    Lipid Panel     Component Value Date/Time   CHOL 125 06/25/2020 1755   TRIG 156 (H) 06/25/2020 1755   HDL 51 06/25/2020 1755   CHOLHDL 2.5 06/25/2020 1755   VLDL 31 06/25/2020 1755    LDLCALC 43 06/25/2020 1755   LDLCALC 82 03/08/2020 0909     Wt Readings from Last 3 Encounters:  10/05/20 196 lb 6.4 oz (89.1 kg)  10/05/20 197 lb 4 oz (89.5 kg)  06/25/20 201 lb (91.2 kg)      Other studies Reviewed: Additional studies/ records that were reviewed today include: TTE 10/10/17  Review of the above records today demonstrates:  - Left ventricle: The cavity size was normal. Wall thickness was   normal. Systolic function was severely reduced. The estimated   ejection fraction was in the range of 25% to 30%. Diffuse   hypokinesis. Doppler parameters are consistent with abnormal left   ventricular relaxation (grade 1 diastolic dysfunction).   ASSESSMENT AND PLAN:  1.  Chronic systolic heart failure due to nonischemic cardiomyopathy: Ejection fraction remains low.  He is currently on optimal medical therapy with Toprol-XL,  losartan, eplerenone.  Is now status post [employer] Jude ICD implanted 01/08/2018.  2.  HIV: Follows up with Dr. Algis Liming.  No changes.  3.  Tobacco abuse: Complete cessation encouraged  4.  Cocaine abuse: Continued abstinence encouraged  Current medicines are reviewed at length with the patient today.   The patient does not have concerns regarding his medicines.  The following changes were made today:  none  Labs/ tests ordered today include:  No orders of the defined types were placed in this encounter.    Disposition:   FU with Wendell Nicoson 12 months  Signed, Tyeler Goedken [name], MD  10/05/2020 3:04 PM     [facility] [address] [place] [address] [phone] (office) [phone] (fax)

## 2020-10-05 NOTE — Progress Notes (Signed)
Advanced [facility] Failure Clinic Note   Patient ID: Ryan Long, male   DOB: 1961/11/24, 59 y.o.   MRN: [id number] ID: Dr Daiva Eves PCP: Dr Armen Pickup EP : Dr Elberta Fortis  HF: Dr Launa Grill Boykin Baetz is a 59 y.o. male with a history of HIV, hypertension, major depressive disorder, chronic systolic heart failure (nonischemic cardiomyopathy), and polysubstance abuse.  Coronary CT angiogram in 3/12 demonstrated no CAD.  Cardiolite in 2015 showed no ischemia or infarction.  NICM was not felt to be related to HIV given his good CD4 counts.   He is S/P St Jude ICD 12/2017  He was evaluated [facility] 12/25/17 with suicidal ideation. He was placed on IVC with psych consulted. He was later cleared and discharged.   Today he returns for HF follow up.He has not been seen since 2019. Overall feeling fine. Says he feels like he has stomach issues with occasional discomfort. Denies SOB/PND/Orthopnea. Appetite ok. No fever or chills. Weight at home 197 pounds. Taking all medications. Lives in a recovery house. He has not been using cocaine or drinking alcohol.    ECHO 06/2013 EF 35-40% ECHO 9/16 with EF 30-35%, diffuse hypokinesis, grade I diastolic dysfunction ECHO 09/2017 EF 25-30%    Past Medical History:  Diagnosis Date  . Active smoker   . AICD (automatic cardioverter/defibrillator) present   . Alcohol abuse   . Anxiety   . AR (allergic rhinitis)   . Bipolar 1 disorder (HCC)   . CHF (congestive heart failure) (HCC)   . Chronic bronchitis (HCC)   . Chronic systolic heart failure (HCC)   . COPD (chronic obstructive pulmonary disease) (HCC)   . Cough 12/31/2018  . Crack cocaine use   . Depression   . Genital herpes   . HIV (human immunodeficiency virus infection) (HCC) dx'd 08/1993  . HLD (hyperlipidemia)   . Hypertension   . NICM (nonischemic cardiomyopathy) (HCC)    Echocardiogram 06/28/11: EF 30-35%, mild MR, mild LAE;  No CAD by coronary CT angiogram 3/12 at [facility]   . NSVT (nonsustained ventricular tachycardia) (HCC)   . PTSD (post-traumatic stress disorder)     Current Outpatient Medications  Medication Sig Dispense Refill  . albuterol (VENTOLIN HFA) 108 (90 Base) MCG/ACT inhaler Inhale 1-2 puffs into the lungs every 6 (six) hours as needed for wheezing or shortness of breath. 1 g 0  . aspirin EC 81 MG tablet Take 81 mg by mouth daily. Swallow whole.    . bictegravir-emtricitabine-tenofovir AF (BIKTARVY) 50-200-25 MG TABS tablet Take 1 tablet by mouth daily. 30 tablet 11  . eplerenone (INSPRA) 25 MG tablet TAKE 1/2 TABLET(12.5 MG) BY MOUTH DAILY 45 tablet 0  . furosemide (LASIX) 40 MG tablet Take 40 mg by mouth daily.    . hydrOXYzine (ATARAX/VISTARIL) 25 MG tablet Take 1 tablet (25 mg total) by mouth at bedtime as needed for anxiety (insomnia). 75 tablet 0  . ibuprofen (ADVIL) 200 MG tablet Take 200 mg by mouth every 6 (six) hours as needed.    Marland Kitchen losartan (COZAAR) 25 MG tablet Take 25 mg by mouth daily.    . melatonin 5 MG TABS Take 5 mg by mouth at bedtime as needed.    . metoprolol succinate (TOPROL-XL) 25 MG 24 hr tablet Take 1 tablet (25 mg total) by mouth daily. For high blood pressure 30 tablet 0  . Multiple Vitamins-Minerals (MENS MULTIPLUS PO) Take by mouth.    . tamsulosin (FLOMAX) 0.4 MG CAPS  capsule Take 2 capsules (0.8 mg total) by mouth daily after supper. For Prostate health 60 capsule 0  . traZODone (DESYREL) 100 MG tablet Take 1 tablet (100 mg total) by mouth at bedtime as needed for sleep. 30 tablet 0  . valACYclovir (VALTREX) 500 MG tablet Take 1 tablet (500 mg total) by mouth daily. For Herpes. 30 tablet 1   No current facility-administered medications for this encounter.    Allergies: No Known Allergies   Review of systems complete and found to be negative unless listed in HPI.    Vital Signs:  Vitals:   10/05/20 1202  BP: 104/62  Pulse: 100  SpO2: 98%  Weight: 89.5 kg (197 lb 4 oz)  Height: 5\' 8"  (1.727 m)    Wt  Readings from Last 3 Encounters:  10/05/20 89.5 kg (197 lb 4 oz)  03/08/20 93 kg (205 lb)  03/24/19 95.7 kg (211 lb)    PHYSICAL EXAM: General:  Well appearing. No resp difficulty HEENT: normal Neck: supple. no JVD. Carotids 2+ bilat; no bruits. No lymphadenopathy or thryomegaly appreciated. Cor: PMI nondisplaced. Regular rate & rhythm. No rubs, gallops or murmurs. L upper chest ICD incision with moderate sero//sang exudate. Tender to touch. Erythema noted.  Lungs: clear Abdomen: soft, nontender, nondistended. No hepatosplenomegaly. No bruits or masses. Good bowel sounds. Extremities: no cyanosis, clubbing, rash, edema Neuro: alert & orientedx3, cranial nerves grossly intact. moves all 4 extremities w/o difficulty. Affect pleasant  ASSESSMENT AND PLAN:  1. Chronic Systolic Heart Failure: Nonischemic cardiomyopathy.  Echo 09/2017 EF 20-25%.  S/P St Jude ICD  - NYHA II.  Volume status stable.  -Stop lasix and losartan.  - Start entresto 24-26 mg twice a day. Check BMEt today and in 7 days.  -Continue toprol xl  25 mg daily. - Continue inspra 12.5 mg daily - Repeat ECHO in 3 months.  2. HIV:  - He has follow up with Dr 10/2017.  No change.  3. Tobacco use disorder:   No longer smoking.   4. Former Cocaine Last use cocaine February.   Discussed medication changes.  Provided with scale and pill box.   March, NP  10/05/2020

## 2020-10-05 NOTE — Patient Instructions (Signed)
Stop Furosemide  Stop Losartan  Start Entresto 24/26 mg Twice daily   Labs done today, your results will be available in MyChart, we will contact you for abnormal readings.  Your physician recommends that you return for lab work in: 1 week  Your physician recommends that you schedule a follow-up appointment in: 3 months with an echocardiogram  If you have any questions or concerns before your next appointment please send [place] a message through [place] or call our office at [phone].    TO LEAVE A MESSAGE FOR THE NURSE SELECT OPTION 2, PLEASE LEAVE A MESSAGE INCLUDING: . YOUR NAME . DATE OF BIRTH . CALL BACK NUMBER . REASON FOR CALL**this is important as we prioritize the call backs  YOU WILL RECEIVE A CALL BACK THE SAME DAY AS LONG AS YOU CALL BEFORE 4:00 PM  At the [facility], you and your health needs are our priority. As part of our continuing mission to provide you with exceptional heart care, we have created designated Provider Care Teams. These Care Teams include your primary Cardiologist (physician) and Advanced Practice Providers (APPs- Physician Assistants and Nurse Practitioners) who all work together to provide you with the care you need, when you need it.   You may see any of the following providers on your designated Care Team at your next follow up: Marland Kitchen Dr Arvilla Meres . Dr Marca Ancona . [name], NP . Robbie Lis, PA . Karle Plumber, PharmD   Please be sure to bring in all your medications bottles to every appointment.

## 2020-10-11 ENCOUNTER — Ambulatory Visit: Payer: Medicare Other | Admitting: Family Medicine

## 2020-10-12 ENCOUNTER — Other Ambulatory Visit (HOSPITAL_COMMUNITY): Payer: Self-pay | Admitting: Adult Health

## 2020-10-12 MED FILL — BIKTARVY 50-200-25 MG TABS: 50-200-25 | 30 days supply | Qty: 30 | Fill #0

## 2020-10-12 MED FILL — EPLERENONE 25 MG TABS: 25 | 30 days supply | Qty: 15 | Fill #0

## 2020-10-14 ENCOUNTER — Ambulatory Visit (HOSPITAL_COMMUNITY)
Admission: RE | Admit: 2020-10-14 | Discharge: 2020-10-14 | Disposition: A | Discharge status: Home / Self Care | Payer: Medicare Other | Source: Ambulatory Visit | Attending: Cardiology | Admitting: Cardiology

## 2020-10-14 ENCOUNTER — Other Ambulatory Visit: Payer: Self-pay

## 2020-10-14 DIAGNOSIS — I5022 Chronic systolic (congestive) heart failure: Secondary | ICD-10-CM | POA: Insufficient documentation

## 2020-10-14 LAB — BASIC METABOLIC PANEL
Anion gap: 9 (ref 5–15)
BUN: 11 mg/dL (ref 6–20)
CO2: 23 mmol/L (ref 22–32)
Calcium: 8.5 mg/dL — ABNORMAL LOW (ref 8.9–10.3)
Chloride: 109 mmol/L (ref 98–111)
Creatinine, Ser: 1.01 mg/dL (ref 0.61–1.24)
GFR, Estimated: >60 mL/min (ref >60)
Glucose, Bld: 100 mg/dL — ABNORMAL HIGH (ref 70–99)
Potassium: 4 mmol/L (ref 3.5–5.1)
Sodium: 141 mmol/L (ref 135–145)

## 2020-11-04 ENCOUNTER — Ambulatory Visit: Payer: Medicare Other | Admitting: Family Medicine

## 2020-11-09 ENCOUNTER — Telehealth: Payer: Self-pay | Admitting: Family Medicine

## 2020-11-09 MED FILL — ENTRESTO 24 MG-26 MG TABLET: 24-26 | 30 days supply | Qty: 60 | Fill #1

## 2020-11-09 MED FILL — BIKTARVY 50-200-25 MG TABS: 50-200-25 | 30 days supply | Qty: 30 | Fill #1

## 2020-11-09 MED FILL — EPLERENONE 25 MG TABS: 25 | 30 days supply | Qty: 15 | Fill #1

## 2020-11-09 NOTE — Telephone Encounter (Signed)
Requested medication (s) are due for refill today:  Yes  Requested medication (s) are on the active medication list:  Yes  Future visit scheduled:  Yes  Last Refill: 09/27/20 courtesy refill on Trazodone, Tamsulosin, and Metoprolol.    Notes to clinic: pt. Has upcoming appt. With Dr. Delford Field in 4 weeks; has no showed or canceled multiple previous appts.  Please advise.   Requested Prescriptions  Pending Prescriptions Disp Refills   traZODone (DESYREL) 100 MG tablet [Pharmacy Med Name: traZODone HCL 100 MG TABS 100 Tablet] 30 tablet 0    Sig: Take 1 tablet (100 mg total) by mouth at bedtime as needed for sleep.      Psychiatry: Antidepressants - Serotonin Modulator Failed - 11/09/2020 11:41 AM      Failed - Valid encounter within last 6 months    Recent Outpatient Visits           9 months ago    [facility]   1 year ago Chronic pain of left ankle   [facility] F[place], [place], MD   2 years ago NICM (nonischemic cardiomyopathy) [facility])   [facility] [place], Odette Horns, MD   3 years ago Mastodynia of left breast   [facility] [place], Odette Horns, MD   3 years ago Secondary hypertension   [facility] [place], Odette Horns, MD       Future Appointments             In 1 week Shirlean Mylar, MD [facility], [facility]   In 4 weeks Storm Frisk, MD [facility]            Passed - Completed PHQ-2 or PHQ-9 in the last 360 days        tamsulosin (FLOMAX) 0.4 MG CAPS capsule [Pharmacy Med Name: TAMSULOSIN HCL 0.4 MG CAP 0.4 Capsule] 60 capsule 0    Sig: Take 2 capsules (0.8 mg total) by mouth daily after supper. For Prostate health      Urology: Alpha-Adrenergic Blocker Failed - 11/09/2020 11:41 AM      Failed - Valid encounter within last 12 months    Recent Outpatient Visits           9  months ago    [facility]   1 year ago Chronic pain of left ankle   [facility] Fulp, [place], MD   2 years ago NICM (nonischemic cardiomyopathy) [facility])   [facility] [place], Odette Horns, MD   3 years ago Mastodynia of left breast   [facility] Hoy Register, MD   3 years ago Secondary hypertension   [place] Hoy Register, MD       Future Appointments             In 1 week Shirlean Mylar, MD [facility], [facility]   In 4 weeks Storm Frisk, MD [facility]            Passed - Last BP in normal range    BP Readings from Last 1 Encounters:  10/05/20 104/62            metoprolol succinate (TOPROL-XL) 25 MG 24 hr tablet [Pharmacy Med Name: METOPROLOL SUCCINATE ER 25 25 Tablet]  30 tablet 0    Sig: Take 1 tablet (25 mg total) by mouth daily. For high blood pressure      Cardiovascular:  Beta Blockers Failed - 11/09/2020 11:41 AM      Failed - Last Heart Rate in normal range    Pulse Readings from Last 1 Encounters:  10/05/20 100          Failed - Valid encounter within last 6 months    Recent Outpatient Visits           9 months ago    [facility]   1 year ago Chronic pain of left ankle   [facility] [facility] [place], [place], MD   2 years ago NICM (nonischemic cardiomyopathy) [facility])   [place] [facility] [place], Odette Horns, MD   3 years ago Mastodynia of left breast   [facility] [place], Odette Horns, MD   3 years ago Secondary hypertension   [facility] Hoy Register, MD       Future Appointments             In 1 week Shirlean Mylar, MD [facility], [facility]   In 4 weeks Storm Frisk, MD [facility]            Passed - Last BP in normal range    BP Readings from Last 1 Encounters:  10/05/20 104/62

## 2020-11-10 ENCOUNTER — Other Ambulatory Visit: Payer: Self-pay | Admitting: Family Medicine

## 2020-11-10 MED FILL — METOPROLOL SUCCINATE ER 25: 25 | 30 days supply | Qty: 30 | Fill #0

## 2020-11-10 MED FILL — traZODone HCL 100 MG TABS: 100 | 30 days supply | Qty: 30 | Fill #0

## 2020-11-10 MED FILL — TAMSULOSIN HCL 0.4 MG CAP: 0.4 | 30 days supply | Qty: 60 | Fill #0

## 2020-11-10 NOTE — Telephone Encounter (Signed)
Pt called to see if his meds can be sent today / please advise

## 2020-11-12 ENCOUNTER — Other Ambulatory Visit: Payer: Self-pay | Admitting: Critical Care Medicine

## 2020-11-12 MED ORDER — TAMSULOSIN HCL 0.4 MG PO CAPS: 0.8000 mg | ORAL_CAPSULE | Freq: Every day | ORAL | 0 refills | Status: DC

## 2020-11-12 MED ORDER — METOPROLOL SUCCINATE ER 25 MG PO TB24: 25.0000 mg | ORAL_TABLET | Freq: Every day | ORAL | 0 refills | Status: DC

## 2020-11-12 NOTE — Telephone Encounter (Signed)
I sent refills to the pharmacy

## 2020-11-15 ENCOUNTER — Other Ambulatory Visit: Payer: Self-pay

## 2020-11-15 ENCOUNTER — Emergency Department (HOSPITAL_COMMUNITY)
Admission: EM | Admit: 2020-11-15 | Discharge: 2020-11-15 | Disposition: A | Discharge status: Home / Self Care | Payer: Medicare Other | Attending: Emergency Medicine | Admitting: Emergency Medicine

## 2020-11-15 ENCOUNTER — Encounter (HOSPITAL_COMMUNITY): Payer: Self-pay

## 2020-11-15 DIAGNOSIS — I5022 Chronic systolic (congestive) heart failure: Secondary | ICD-10-CM | POA: Diagnosis not present

## 2020-11-15 DIAGNOSIS — J449 Chronic obstructive pulmonary disease, unspecified: Secondary | ICD-10-CM | POA: Diagnosis not present

## 2020-11-15 DIAGNOSIS — Z7982 Long term (current) use of aspirin: Secondary | ICD-10-CM | POA: Insufficient documentation

## 2020-11-15 DIAGNOSIS — Z21 Asymptomatic human immunodeficiency virus [HIV] infection status: Secondary | ICD-10-CM | POA: Insufficient documentation

## 2020-11-15 DIAGNOSIS — R369 Urethral discharge, unspecified: Secondary | ICD-10-CM | POA: Insufficient documentation

## 2020-11-15 DIAGNOSIS — I110 Hypertensive heart disease with heart failure: Secondary | ICD-10-CM | POA: Diagnosis not present

## 2020-11-15 DIAGNOSIS — F17210 Nicotine dependence, cigarettes, uncomplicated: Secondary | ICD-10-CM | POA: Insufficient documentation

## 2020-11-15 DIAGNOSIS — R309 Painful micturition, unspecified: Secondary | ICD-10-CM | POA: Insufficient documentation

## 2020-11-15 DIAGNOSIS — Z79899 Other long term (current) drug therapy: Secondary | ICD-10-CM | POA: Diagnosis not present

## 2020-11-15 DIAGNOSIS — R300 Dysuria: Secondary | ICD-10-CM

## 2020-11-15 LAB — URINALYSIS, ROUTINE W REFLEX MICROSCOPIC
Bilirubin Urine: NEGATIVE
Glucose, UA: NEGATIVE mg/dL
Ketones, ur: NEGATIVE mg/dL
Leukocytes,Ua: NEGATIVE
Nitrite: NEGATIVE
Protein, ur: NEGATIVE mg/dL
Specific Gravity, Urine: 1.009 (ref 1.005–1.030)
pH: 5 (ref 5.0–8.0)

## 2020-11-15 MED ORDER — CEFTRIAXONE SODIUM 500 MG IJ SOLR
500.0000 mg | Freq: Once | INTRAMUSCULAR | Status: AC
  Administered 2020-11-15: 500 mg via INTRAMUSCULAR
  Filled 2020-11-15: qty 500

## 2020-11-15 MED ORDER — AZITHROMYCIN 250 MG PO TABS
1000.0000 mg | ORAL_TABLET | Freq: Once | ORAL | Status: AC
  Administered 2020-11-15: 1000 mg via ORAL
  Filled 2020-11-15: qty 4

## 2020-11-15 NOTE — Telephone Encounter (Signed)
Called pt unable to reach/ LVM refill was sent to pharmacy

## 2020-11-15 NOTE — ED Triage Notes (Signed)
Pt requesting STD check due to dysuria and penile discharge since last Thursday.

## 2020-11-15 NOTE — ED Provider Notes (Signed)
[facility] EMERGENCY DEPARTMENT Provider Note   CSN: [id number] Arrival date & time: 11/15/20  6301     History Chief Complaint  Patient presents with  . Exposure to STD    Ryan Long is a 59 y.o. male.  Patient is a 59 year old male with past medical history of nonischemic cardiomyopathy, COPD, CHF, HIV disease.  Patient presents today for evaluation of urethral discharge and burning with urination.  This has been worsening over the past 2 days.  Patient reports sex with a new partner starting 2 weeks ago.  Patient denies any fevers or chills.  He denies any abdominal pain.  He denies any rash or genital lesions.  The history is provided by the patient.       Past Medical History:  Diagnosis Date  . Active smoker   . AICD (automatic cardioverter/defibrillator) present   . Alcohol abuse   . Anxiety   . AR (allergic rhinitis)   . Bipolar 1 disorder (HCC)   . CHF (congestive heart failure) (HCC)   . Chronic bronchitis (HCC)   . Chronic systolic heart failure (HCC)   . COPD (chronic obstructive pulmonary disease) (HCC)   . Cough 12/31/2018  . Crack cocaine use   . Depression   . Genital herpes   . HIV (human immunodeficiency virus infection) (HCC) dx'd 08/1993  . HLD (hyperlipidemia)   . Hypertension   . NICM (nonischemic cardiomyopathy) (HCC)    Echocardiogram 06/28/11: EF 30-35%, mild MR, mild LAE;  No CAD by coronary CT angiogram 3/12 at [facility]  . NSVT (nonsustained ventricular tachycardia) (HCC)   . PTSD (post-traumatic stress disorder)     Patient Active Problem List   Diagnosis Date Noted  . Severe recurrent major depression with psychotic features (HCC) 06/25/2020  . Cough 12/31/2018  . Controlled substance agreement signed 10/22/2018  . AICD (automatic cardioverter/defibrillator) present 08/15/2018  . Abnormal EKG 06/28/2018  . Cocaine abuse (HCC) 06/25/2018  . Encounter for other general examination  06/25/2018  . Current severe episode of major depressive disorder with psychotic features without prior episode (HCC) 06/24/2018  . MDD (major depressive disorder), recurrent episode, moderate (HCC) 06/06/2018  . Current moderate episode of major depressive disorder (HCC) 05/24/2018  . Adjustment disorder with mixed disturbance of emotions and conduct   . NICM (nonischemic cardiomyopathy) (HCC) 01/08/2018  . Suicidal ideation 12/25/2017  . Anxiety 12/20/2017  . Bipolar 1 disorder (HCC) 10/10/2017  . Breast lump 10/03/2017  . Chronic systolic heart failure (HCC) 05/13/2017  . Genital herpes 05/13/2017  . HSV-2 infection 07/17/2016  . 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 Nonpsychotic mental disorder 06/22/2016  . Screening examination for venereal disease 04/18/2016  . Encounter for long-term (current) use of medications 04/18/2016  . History of attempted suicide 04/14/2016  . History of alcoholism (HCC) 04/14/2016  . History of substance abuse (HCC) 04/14/2016  . Constipation 01/17/2016  . Homelessness 01/11/2016  . Major depressive disorder, recurrent, unspecified (HCC) 10/08/2015  . Chronic obstructive pulmonary disease (HCC) 10/04/2015  . Abnormal thyroid stimulating hormone (TSH) level 10/04/2015  . MDD (major depressive disorder), recurrent episode, severe (HCC) 09/23/2015  . Hypertension 09/10/2015  . MDD (major depressive disorder), recurrent, severe, with psychosis (HCC) 08/07/2015  . PTSD (post-traumatic stress disorder) 08/07/2015  . Cocaine use disorder, severe, dependence (HCC) 08/07/2015  . Major depressive disorder, recurrent severe without psychotic features (HCC) 04/26/2015  . Primary insomnia 04/02/2015  . Herpesviral  infection of penis 04/02/2015  . Stimulant use disorder 05/29/2014  . Generalized anxiety disorder 05/29/2014  . Nondependent cannabis abuse, episodic 02/16/2014  . Cannabis abuse,  uncomplicated 02/16/2014  . Polysubstance (excluding opioids) dependence with physiological dependence (HCC) 12/30/2013  . Substance induced mood disorder (HCC) 11/22/2013  . Tobacco use disorder 02/17/2013  . Cocaine abuse, episodic use (HCC) 03/20/2012    Class: Acute  . Alcohol dependence (HCC) 03/20/2012    Class: Acute  . Alcohol use disorder, severe, dependence (HCC) 03/20/2012  . Heart failure (HCC) 09/19/2011  . Neutropenia (HCC) 09/19/2011  . Alcohol use with alcohol-induced disorder (HCC) 09/19/2011  . Major depressive disorder, single episode, unspecified 09/17/2011  . Other primary cardiomyopathies 08/24/2011  . Human immunodeficiency virus (HIV) disease (HCC) 02/01/2009  . Recurrent HSV (herpes simplex virus) 02/01/2009  . Hyperlipidemia 02/01/2009  . Allergic rhinitis 02/01/2009    Past Surgical History:  Procedure Laterality Date  . CARDIAC DEFIBRILLATOR PLACEMENT  01/08/2018  . ICD IMPLANT N/A 01/08/2018   Procedure: ICD IMPLANT;  Surgeon: Regan Lemming, MD;  Location: [facility] INVASIVE CV LAB;  Service: Cardiovascular;  Laterality: N/A;       Family History  Problem Relation Age of Onset  . Glaucoma Mother   . Mental illness Mother   . Vision loss Mother   . Hypertension Father   . CAD Father   . Mental illness Father   . Alcohol abuse Father   . Drug abuse Father   . Heart attack Father 30  . Early death Father   . Alcohol abuse Brother   . Drug abuse Brother   . Drug abuse Brother     Social History   Tobacco Use  . Smoking status: Current Every Day Smoker    Packs/day: 0.75    Years: 38.00    Pack years: 28.50    Types: Cigarettes  . Smokeless tobacco: Never Used  . Tobacco comment: hoping welbutrin will help  Vaping Use  . Vaping Use: Never used  Substance Use Topics  . Alcohol use: Not Currently    Alcohol/week: 6.0 standard drinks    Types: 6 Cans of beer per week    Comment: 01/08/2018 "stopped 06/2017"  . Drug use: Not Currently     Types: Cocaine    Comment: 01/08/2018 "stopped 06/2017"    Home Medications Prior to Admission medications   Medication Sig Start Date End Date Taking? Authorizing Provider  albuterol (VENTOLIN HFA) 108 (90 Base) MCG/ACT inhaler Inhale 1-2 puffs into the lungs every 6 (six) hours as needed for wheezing or shortness of breath. 07/01/20   Armandina Stammer I, NP  aspirin EC 81 MG tablet Take 81 mg by mouth daily. Swallow whole.    [provider]  bictegravir-emtricitabine-tenofovir AF (BIKTARVY) 50-200-25 MG TABS tablet Take 1 tablet by mouth daily. 03/08/20   Randall Hiss, MD  eplerenone (INSPRA) 25 MG tablet TAKE 1/2 TABLET BY MOUTH DAILY. 10/12/20   Bensimhon, Bevelyn Buckles, MD  hydrOXYzine (ATARAX/VISTARIL) 25 MG tablet Take 1 tablet (25 mg total) by mouth at bedtime as needed for anxiety (insomnia). 07/01/20   Armandina Stammer I, NP  ibuprofen (ADVIL) 200 MG tablet Take 200 mg by mouth every 6 (six) hours as needed.    [provider]  melatonin 5 MG TABS Take 5 mg by mouth at bedtime as needed.    [provider]  metoprolol succinate (TOPROL-XL) 25 MG 24 hr tablet Take 1 tablet (25 mg total) by  mouth daily. For high blood pressure 11/12/20   Storm Frisk, MD  Multiple Vitamins-Minerals (MENS MULTIPLUS PO) Take by mouth.    [provider]  sacubitril-valsartan (ENTRESTO) 24-26 MG Take 1 tablet by mouth 2 (two) times daily. 10/05/20   Clegg, Amy D, NP  tamsulosin (FLOMAX) 0.4 MG CAPS capsule Take 2 capsules (0.8 mg total) by mouth daily after supper. For Prostate health 11/12/20   Storm Frisk, MD  traZODone (DESYREL) 100 MG tablet TAKE 1 TABLET (100 MG TOTAL) BY MOUTH AT BEDTIME AS NEEDED FOR SLEEP. 11/10/20   Hoy Register, MD  valACYclovir (VALTREX) 500 MG tablet Take 1 tablet (500 mg total) by mouth daily. For Herpes. 03/17/19   Cain Saupe, MD    Allergies    Patient has no known allergies.  Review of Systems   Review of Systems  All other  systems reviewed and are negative.   Physical Exam Updated Vital Signs BP 125/81 (BP Location: Right Arm)   Pulse (!) 112   Temp 97.9 F (36.6 C) (Oral)   Resp 18   Ht 5\' 9"  (1.753 m)   Wt 90.7 kg   SpO2 96%   BMI 29.53 kg/m   Physical Exam Vitals and nursing note reviewed.  Constitutional:      General: He is not in acute distress.    Appearance: Normal appearance. He is not ill-appearing.  HENT:     Head: Normocephalic and atraumatic.  Pulmonary:     Effort: Pulmonary effort is normal.  Skin:    General: Skin is warm and dry.  Neurological:     Mental Status: He is alert and oriented to person, place, and time.     ED Results / Procedures / Treatments   Labs (all labs ordered are listed, but only abnormal results are displayed) Labs Reviewed  URINALYSIS, ROUTINE W REFLEX MICROSCOPIC  GC/CHLAMYDIA PROBE AMP ([place]) NOT AT [facility]    EKG None  Radiology No results found.  Procedures Procedures (including critical care time)  Medications Ordered in ED Medications  cefTRIAXone (ROCEPHIN) injection 500 mg (has no administration in time range)  azithromycin (ZITHROMAX) tablet 1,000 mg (has no administration in time range)    ED Course  I have reviewed the triage vital signs and the nursing notes.  Pertinent labs & imaging results that were available during my care of the patient were reviewed by me and considered in my medical decision making (see chart for details).    MDM Rules/Calculators/A&P  UA negative for UTI.  GC and Chlamydia cultures are pending, but patient will be treated presumptively.  He will be given Rocephin and Zithromax.  He is to return as needed for any problems.  He will follow his test results on MyChart.  If positive he will inform his sexual partners so that they can get treated as well.  Final Clinical Impression(s) / ED Diagnoses Final diagnoses:  None    Rx / DC Orders ED Discharge Orders    None       [facility], MD 11/15/20 [phone]

## 2020-11-15 NOTE — Discharge Instructions (Addendum)
We will call you if your cultures indicate you require further treatment or need to take additional action. 

## 2020-11-16 LAB — GC/CHLAMYDIA PROBE AMP (~~LOC~~) NOT AT ARMC
Chlamydia: NEGATIVE
Comment: NEGATIVE
Comment: NORMAL
Neisseria Gonorrhea: NEGATIVE

## 2020-11-16 NOTE — Telephone Encounter (Signed)
Called pt, unable to reach . Left VM to call back.

## 2020-11-18 ENCOUNTER — Ambulatory Visit: Payer: Medicare Other | Admitting: Family

## 2020-11-21 NOTE — Patient Instructions (Incomplete)
It was a pleasure to see you today!  1.    Be Well,  Dr. Mahoney  

## 2020-11-21 NOTE — Progress Notes (Deleted)
    SUBJECTIVE:   CHIEF COMPLAINT / HPI:   PMH: COPD, HFrEF- nonischemic cardiomyopathy s/p AICD placement, polysubstance use disorder, MDD, PTSD, BPD PSH: Allergies: Meds: entresto, biktarvy, toprol xl, losartan, eplerenone Family Hx: Social Hx: Patient with history of polysubstance use, went to inpatient rehab in July.   PERTINENT  PMH / PSH: ***  OBJECTIVE:   There were no vitals taken for this visit.  ***  ASSESSMENT/PLAN:   No problem-specific Assessment & Plan notes found for this encounter.     Shirlean Mylar, MD [facility]

## 2020-11-22 ENCOUNTER — Ambulatory Visit: Payer: Medicare Other | Admitting: Family Medicine

## 2020-11-30 ENCOUNTER — Other Ambulatory Visit (HOSPITAL_COMMUNITY): Payer: Self-pay | Admitting: Adult Health

## 2020-12-01 MED FILL — hydrOXYzine HCL 25 MG TABS: 25 | 15 days supply | Qty: 60 | Fill #0

## 2020-12-03 ENCOUNTER — Other Ambulatory Visit (HOSPITAL_COMMUNITY): Payer: Self-pay

## 2020-12-03 MED ORDER — HYDROXYZINE HCL 25 MG PO TABS: ORAL_TABLET | ORAL | 3 refills | Status: DC

## 2020-12-07 ENCOUNTER — Ambulatory Visit: Payer: Medicare Other | Admitting: Critical Care Medicine

## 2020-12-13 ENCOUNTER — Other Ambulatory Visit: Payer: Self-pay | Admitting: Critical Care Medicine

## 2020-12-13 ENCOUNTER — Other Ambulatory Visit: Payer: Self-pay | Admitting: Family Medicine

## 2020-12-13 DIAGNOSIS — B009 Herpesviral infection, unspecified: Secondary | ICD-10-CM

## 2020-12-13 MED FILL — METOPROLOL SUCCINATE ER 25: 25 | 30 days supply | Qty: 30 | Fill #0

## 2020-12-13 MED FILL — EPLERENONE 25 MG TABS: 25 | 30 days supply | Qty: 15 | Fill #2

## 2020-12-13 MED FILL — ENTRESTO 24 MG-26 MG TABLET: 24-26 | 30 days supply | Qty: 60 | Fill #2

## 2020-12-13 MED FILL — traZODone HCL 100 MG TABS: 100 | 30 days supply | Qty: 30 | Fill #0

## 2020-12-13 MED FILL — TAMSULOSIN HCL 0.4 MG CAP: 0.4 | 30 days supply | Qty: 60 | Fill #0

## 2020-12-13 MED FILL — BIKTARVY 50-200-25 MG TABS: 50-200-25 | 30 days supply | Qty: 30 | Fill #2

## 2020-12-13 NOTE — Telephone Encounter (Signed)
Requested medication (s) are due for refill today: yes  Requested medication (s) are on the active medication list: yes  Last refill:  03/17/19  Future visit scheduled: yes  Notes to clinic:  no refill protocol for this order   Requested Prescriptions  Pending Prescriptions Disp Refills   valACYclovir (VALTREX) 500 MG tablet [Pharmacy Med Name: VALACYCLOVIR HCL 500 MG TAB 500 Tablet] 30 tablet 1    Sig: TAKE 1 TABLET BY MOUTH DAILY FOR HERPES.      There is no refill protocol information for this order

## 2020-12-14 ENCOUNTER — Other Ambulatory Visit: Payer: Self-pay | Admitting: Critical Care Medicine

## 2020-12-14 MED FILL — VALACYCLOVIR HCL 500 MG TAB: 500 | 30 days supply | Qty: 30 | Fill #0

## 2020-12-15 MED FILL — hydrOXYzine HCL 25 MG TABS: 25 | 15 days supply | Qty: 60 | Fill #0

## 2021-01-03 ENCOUNTER — Ambulatory Visit (HOSPITAL_COMMUNITY): Payer: Medicare Other

## 2021-01-03 ENCOUNTER — Encounter (HOSPITAL_COMMUNITY): Payer: Medicare Other | Admitting: Internal Medicine

## 2021-01-05 ENCOUNTER — Other Ambulatory Visit (HOSPITAL_COMMUNITY): Payer: Self-pay

## 2021-01-05 ENCOUNTER — Other Ambulatory Visit: Payer: Self-pay | Admitting: Critical Care Medicine

## 2021-01-05 MED ORDER — ENTRESTO 24-26 MG PO TABS
1.0000 | ORAL_TABLET | Freq: Two times a day (BID) | ORAL | 0 refills | Status: DC
Start: 2021-01-05 — End: 2021-03-30

## 2021-01-05 MED FILL — EPLERENONE 25 MG TABS: 25 | 30 days supply | Qty: 15 | Fill #3

## 2021-01-05 MED FILL — ENTRESTO 24 MG-26 MG TABLET: 24-26 | 30 days supply | Qty: 60 | Fill #0

## 2021-01-05 NOTE — Telephone Encounter (Signed)
  Notes to clinic Has appt with Dr. Delford Field 01/06/21.

## 2021-01-06 ENCOUNTER — Other Ambulatory Visit: Payer: Self-pay

## 2021-01-06 ENCOUNTER — Ambulatory Visit: Payer: Medicare Other | Attending: Critical Care Medicine | Admitting: Critical Care Medicine

## 2021-01-06 ENCOUNTER — Encounter: Payer: Self-pay | Admitting: Critical Care Medicine

## 2021-01-06 DIAGNOSIS — B20 Human immunodeficiency virus [HIV] disease: Secondary | ICD-10-CM

## 2021-01-06 DIAGNOSIS — I472 Ventricular tachycardia, unspecified: Secondary | ICD-10-CM

## 2021-01-06 DIAGNOSIS — Z1211 Encounter for screening for malignant neoplasm of colon: Secondary | ICD-10-CM

## 2021-01-06 DIAGNOSIS — J449 Chronic obstructive pulmonary disease, unspecified: Secondary | ICD-10-CM

## 2021-01-06 DIAGNOSIS — R3911 Hesitancy of micturition: Secondary | ICD-10-CM

## 2021-01-06 DIAGNOSIS — F332 Major depressive disorder, recurrent severe without psychotic features: Secondary | ICD-10-CM

## 2021-01-06 DIAGNOSIS — F319 Bipolar disorder, unspecified: Secondary | ICD-10-CM

## 2021-01-06 DIAGNOSIS — F17200 Nicotine dependence, unspecified, uncomplicated: Secondary | ICD-10-CM

## 2021-01-06 DIAGNOSIS — R7989 Other specified abnormal findings of blood chemistry: Secondary | ICD-10-CM

## 2021-01-06 DIAGNOSIS — I159 Secondary hypertension, unspecified: Secondary | ICD-10-CM

## 2021-01-06 DIAGNOSIS — B009 Herpesviral infection, unspecified: Secondary | ICD-10-CM

## 2021-01-06 DIAGNOSIS — M25559 Pain in unspecified hip: Secondary | ICD-10-CM | POA: Diagnosis not present

## 2021-01-06 DIAGNOSIS — N401 Enlarged prostate with lower urinary tract symptoms: Secondary | ICD-10-CM

## 2021-01-06 DIAGNOSIS — I5022 Chronic systolic (congestive) heart failure: Secondary | ICD-10-CM | POA: Diagnosis not present

## 2021-01-06 MED ORDER — TRAZODONE HCL 100 MG PO TABS
100.0000 mg | ORAL_TABLET | Freq: Every evening | ORAL | 3 refills | Status: DC | PRN
Start: 2021-01-06 — End: 2021-05-23

## 2021-01-06 MED ORDER — VALACYCLOVIR HCL 500 MG PO TABS: ORAL_TABLET | ORAL | 1 refills | Status: DC

## 2021-01-06 MED ORDER — TAMSULOSIN HCL 0.4 MG PO CAPS: 0.8000 mg | ORAL_CAPSULE | Freq: Every day | ORAL | 3 refills | Status: DC

## 2021-01-06 MED ORDER — ALBUTEROL SULFATE HFA 108 (90 BASE) MCG/ACT IN AERS
1.0000 | INHALATION_SPRAY | Freq: Four times a day (QID) | RESPIRATORY_TRACT | 0 refills | Status: DC | PRN
Start: 2021-01-06 — End: 2021-03-22

## 2021-01-06 MED ORDER — METOPROLOL SUCCINATE ER 25 MG PO TB24: 25.0000 mg | ORAL_TABLET | Freq: Every day | ORAL | 2 refills | Status: DC

## 2021-01-06 MED FILL — METOPROLOL SUCCINATE ER 25: 25 | 90 days supply | Qty: 90 | Fill #0

## 2021-01-06 MED FILL — TAMSULOSIN HCL 0.4 MG CAP: 0.4 | 30 days supply | Qty: 60 | Fill #0

## 2021-01-06 MED FILL — VALACYCLOVIR HCL 500 MG TAB: 500 | 30 days supply | Qty: 30 | Fill #0

## 2021-01-06 MED FILL — traZODone HCL 100 MG TABS: 100 | 30 days supply | Qty: 30 | Fill #0

## 2021-01-06 NOTE — Assessment & Plan Note (Signed)
Refill of Flomax

## 2021-01-06 NOTE — Assessment & Plan Note (Signed)
Hypertension well controlled by patient report follow-up in the office

## 2021-01-06 NOTE — Assessment & Plan Note (Signed)
Refills on inhalers made

## 2021-01-06 NOTE — Assessment & Plan Note (Signed)
Continue Valtrex suppressive therapy

## 2021-01-06 NOTE — Progress Notes (Signed)
Referral for colonoscopy -  Lt side abdomen pain - takes laxative to alleviate discomfort  Has left side pain after slipping ice.

## 2021-01-06 NOTE — Assessment & Plan Note (Signed)
History of severe depression currently stable at this time we will follow-up in the office

## 2021-01-06 NOTE — Assessment & Plan Note (Signed)
Care per HIV clinic follow-up arranged

## 2021-01-06 NOTE — Assessment & Plan Note (Addendum)
  .   Current smoking consumption amount: 1 pack every 2 to 3 days  . Dicsussion on advise to quit smoking and smoking impacts: Cardiovascular lung impacts  . Patient's willingness to quit: Willing to quit  . Methods to quit smoking discussed: Behavioral modification  . Medication management of smoking session drugs discussed: on wellbutrin   . Resources provided:  AVS   . Setting quit date not established  . Follow-up arranged 1 month   Time spent counseling the patient: 5 minutes

## 2021-01-06 NOTE — Assessment & Plan Note (Signed)
Status post fall with hip pain  X-ray of left hip and pelvis will be obtained

## 2021-01-06 NOTE — Assessment & Plan Note (Signed)
History of abnormal TSH levels  Follow-up thyroid function

## 2021-01-06 NOTE — Assessment & Plan Note (Signed)
Chronic systolic heart failure plan per advanced heart failure clinic  Continue Entresto and Toprol Note anticoagulation aspirin Lasix has been stopped  Follow-up echocardiogram Follow-up in the AICD clinic

## 2021-01-06 NOTE — Assessment & Plan Note (Signed)
As per chronic systolic heart failure assessment

## 2021-01-06 NOTE — Assessment & Plan Note (Signed)
No evidence of recurrent ventricular tachycardia no recent firing of the AICD

## 2021-01-06 NOTE — Assessment & Plan Note (Signed)
Bipolar disorder stable at this time Continue Atarax as needed  After I meet the patient I will encourage mental health follow-up

## 2021-01-06 NOTE — Progress Notes (Signed)
Subjective:    Patient ID: Ryan Long, male    DOB: 11-Sep-1961, 60 y.o.   MRN: [id number] Virtual Visit via Telephone Note  I connected with Neysa Bonito Marchio on 01/06/21 at 11:00 AM EST by telephone and verified that I am speaking with the correct person using two identifiers.   Consent:  I discussed the limitations, risks, security and privacy concerns of performing an evaluation and management service by telephone and the availability of in person appointments. I also discussed with the patient that there may be a patient responsible charge related to this service. The patient expressed understanding and agreed to proceed.  Location of patient: Patient's at home  Location of provider: I am in my office  Persons participating in the televisit with the patient.   No one else on the call    History of Present Illness:    Observations/Objective:   Assessment and Plan:     60 y.o.M here to est PCP  Hx HTN, CHF, NICM, AICD in place,  allergic rhinitis, COPD, subst use mood d/o, ETOH use, HIV, Cocaine use, MDD, PTSD, Bipolar, prior suicide attempts,   01/06/2021 Patient is here to establish primary care by way of a telephone visit.  He fell yesterday on the ice and his leg is too sore to come in for a visit Patient notes he is having left-sided hip pain since he fell on the ice but has not sought attention as of yet.  He is able to stand and pivot on the leg.  Patient moved from [place] to [place] in August 2021 and he is now seeking primary care.  He has a prior history of ethanol abuse but he states he now only drinks about 1 beer a month.  Also history of tobacco use about three quarters a pack a day of cigarettes are being utilized.  The patient has history of nonischemic cardiomyopathy systolic with an AICD in place.  He is now being followed by local [facility] cardiology.  History of PTSD bipolar major depressive disorder.  He has had prior suicide attempts  but he is not suicidal at this time.  This patient has history of allergic rhinitis COPD cocaine use HIV and hypertension.  He does not have diabetes.  Patient does have recurrent outbreaks of herpes viral infection in the penis area and is on chronic Valtrex suppression.  He is on Biktarvy for HIV and is followed by our [facility] has upcoming appointments.  He is followed in the heart vascular advanced heart failure clinic and has follow-up appointments in March noted.  He has an echocardiogram pending in March.  Last echocardiogram showed an EF of 20%.  Patient CD4 counts are normal and he is on his Biktarvy and is compliant with therapy.  The patient is needing refills on all his medications and has no other specific complaints at this time.  Past Medical History:  Diagnosis Date  . Active smoker   . AICD (automatic cardioverter/defibrillator) present   . Alcohol abuse   . Anxiety   . AR (allergic rhinitis)   . Bipolar 1 disorder (HCC)   . CHF (congestive heart failure) (HCC)   . Chronic bronchitis (HCC)   . Chronic systolic heart failure (HCC)   . Controlled substance agreement signed 10/22/2018  . COPD (chronic obstructive pulmonary disease) (HCC)   . Cough 12/31/2018  . Crack cocaine use   . Depression   . Genital herpes   . HIV (human immunodeficiency virus  infection) (HCC) dx'd 08/1993  . HLD (hyperlipidemia)   . Hypertension   . NICM (nonischemic cardiomyopathy) (HCC)    Echocardiogram 06/28/11: EF 30-35%, mild MR, mild LAE;  No CAD by coronary CT angiogram 3/12 [facility]  . NSVT (nonsustained ventricular tachycardia) (HCC)   . PTSD (post-traumatic stress disorder)      Family History  Problem Relation Age of Onset  . Glaucoma Mother   . Mental illness Mother   . Vision loss Mother   . Hypertension Father   . CAD Father   . Mental illness Father   . Alcohol abuse Father   . Drug abuse Father   . Heart attack Father 2  . Early death Father   . Alcohol  abuse Brother   . Drug abuse Brother   . Drug abuse Brother      Social History   Socioeconomic History  . Marital status: Single    Spouse name: Not on file  . Number of children: Not on file  . Years of education: Not on file  . Highest education level: Not on file  Occupational History  . Not on file  Tobacco Use  . Smoking status: Current Every Day Smoker    Packs/day: 0.75    Years: 38.00    Pack years: 28.50    Types: Cigarettes  . Smokeless tobacco: Never Used  . Tobacco comment: hoping welbutrin will help  Vaping Use  . Vaping Use: Never used  Substance and Sexual Activity  . Alcohol use: Not Currently    Alcohol/week: 6.0 standard drinks    Types: 6 Cans of beer per week    Comment: 01/08/2018 "stopped 06/2017"  . Drug use: Not Currently    Types: Cocaine    Comment: 01/08/2018 "stopped 06/2017"  . Sexual activity: Yes    Birth control/protection: Condom    Comment: pt. declined condoms  Other Topics Concern  . Not on file  Social History Narrative  . Not on file   Social Determinants of Health   Financial Resource Strain: Not on file  Food Insecurity: Not on file  Transportation Needs: Not on file  Physical Activity: Not on file  Stress: Not on file  Social Connections: Not on file  Intimate Partner Violence: Not on file     No Known Allergies   Outpatient Medications Prior to Visit  Medication Sig Dispense Refill  . aspirin EC 81 MG tablet Take 81 mg by mouth daily. Swallow whole.    . bictegravir-emtricitabine-tenofovir AF (BIKTARVY) 50-200-25 MG TABS tablet Take 1 tablet by mouth daily. 30 tablet 11  . eplerenone (INSPRA) 25 MG tablet TAKE 1/2 TABLET BY MOUTH DAILY. 15 tablet 11  . hydrOXYzine (ATARAX/VISTARIL) 25 MG tablet TAKE 1-2 TABLETS BY MOUTH 2 TIMES DAILY AS NEEDED FOR ANXIETY. 60 tablet 3  . Multiple Vitamins-Minerals (MENS MULTIPLUS PO) Take by mouth.    . sacubitril-valsartan (ENTRESTO) 24-26 MG Take 1 tablet by mouth 2 (two) times  daily. 90 tablet 0  . metoprolol succinate (TOPROL-XL) 25 MG 24 hr tablet TAKE 1 TABLET (25 MG TOTAL) BY MOUTH DAILY. FOR HIGH BLOOD PRESSURE 30 tablet 0  . tamsulosin (FLOMAX) 0.4 MG CAPS capsule Take 2 capsules (0.8 mg total) by mouth daily after supper. For Prostate health 60 capsule 0  . albuterol (VENTOLIN HFA) 108 (90 Base) MCG/ACT inhaler Inhale 1-2 puffs into the lungs every 6 (six) hours as needed for wheezing or shortness of breath. (Patient not taking: Reported  on 01/06/2021) 1 g 0  . ibuprofen (ADVIL) 200 MG tablet Take 200 mg by mouth every 6 (six) hours as needed.    . melatonin 5 MG TABS Take 5 mg by mouth at bedtime as needed.    . traZODone (DESYREL) 100 MG tablet TAKE 1 TABLET (100 MG TOTAL) BY MOUTH AT BEDTIME AS NEEDED FOR SLEEP. 30 tablet 0  . valACYclovir (VALTREX) 500 MG tablet TAKE 1 TABLET BY MOUTH DAILY FOR HERPES. 30 tablet 0   No facility-administered medications prior to visit.      Review of Systems  Constitutional: Negative.   HENT: Negative.   Eyes: Negative.   Respiratory: Negative.   Cardiovascular: Negative.   Gastrointestinal: Negative.   Endocrine: Negative.   Musculoskeletal:       Left hip pain s/p fall   Neurological: Negative.   Psychiatric/Behavioral: Negative.        Objective:   Physical Exam There were no vitals filed for this visit. No exam this is a phone visit      Assessment & Plan:  I personally reviewed all images and lab data in the [facility] system as well as any outside material available during this office visit and agree with the  radiology impressions.   Chronic systolic heart failure (HCC) Chronic systolic heart failure plan per advanced heart failure clinic  Continue Entresto and Toprol Note anticoagulation aspirin Lasix has been stopped  Follow-up echocardiogram Follow-up in the A[facility] clinic  Heart failure (HCC) As per chronic systolic heart failure assessment  Hypertension Hypertension well controlled by patient  report follow-up in the office  VT (ventricular tachycardia) (HCC) No evidence of recurrent ventricular tachycardia no recent firing of the AICD  Chronic obstructive pulmonary disease (HCC) Refills on inhalers made  BPH (benign prostatic hyperplasia) Refill of Flomax  Bipolar 1 disorder (HCC) Bipolar disorder stable at this time Continue Atarax as needed  After I meet the patient I will encourage mental health follow-up  Human immunodeficiency virus (HIV) disease (HCC) Care per HIV clinic follow-up arranged  Recurrent HSV (herpes simplex virus) Continue Valtrex suppressive therapy  Tobacco use disorder    . Current smoking consumption amount: 1 pack every 2 to 3 days  . Dicsussion on advise to quit smoking and smoking impacts: Cardiovascular lung impacts  . Patient's willingness to quit: Willing to quit  . Methods to quit smoking discussed: Behavioral modification  . Medication management of smoking session drugs discussed: on wellbutrin   . Resources provided:  AVS   . Setting quit date not established  . Follow-up arranged 1 month   Time spent counseling the patient: 5 minutes    MDD (major depressive disorder), recurrent episode, severe (HCC) History of severe depression currently stable at this time we will follow-up in the office  Abnormal thyroid stimulating hormone (TSH) level History of abnormal TSH levels  Follow-up thyroid function  Hip pain Status post fall with hip pain  X-ray of left hip and pelvis will be obtained   Diagnoses and all orders for this visit:  Chronic systolic heart failure (HCC) -     Comprehensive metabolic panel; Future -     CBC with Differential/Platelet; Future  HSV infection -     valACYclovir (VALTREX) 500 MG tablet; TAKE 1 TABLET BY MOUTH DAILY FOR HERPES.  Hip pain -     DG Hip Unilat W OR W/O Pelvis 2-3 Views Left; Future  Secondary hypertension -     Comprehensive metabolic panel; Future  Colon cancer  screening -     Ambulatory referral to Gastroenterology  VT (ventricular tachycardia) (HCC)  Chronic obstructive pulmonary disease, unspecified COPD type (HCC)  Benign prostatic hyperplasia with urinary hesitancy  Bipolar 1 disorder (HCC)  Human immunodeficiency virus (HIV) disease (HCC)  Recurrent HSV (herpes simplex virus)  Tobacco use disorder  Severe episode of recurrent major depressive disorder, without psychotic features (HCC)  Abnormal thyroid stimulating hormone (TSH) level -     Thyroid Panel With TSH; Future  Other orders -     albuterol (VENTOLIN HFA) 108 (90 Base) MCG/ACT inhaler; Inhale 1-2 puffs into the lungs every 6 (six) hours as needed for wheezing or shortness of breath. -     metoprolol succinate (TOPROL-XL) 25 MG 24 hr tablet; Take 1 tablet (25 mg total) by mouth daily. For high blood pressure -     traZODone (DESYREL) 100 MG tablet; Take 1 tablet (100 mg total) by mouth at bedtime as needed for sleep. -     tamsulosin (FLOMAX) 0.4 MG CAPS capsule; Take 2 capsules (0.8 mg total) by mouth daily after supper. For Prostate health  Follow Up Instructions: Patient is a follow-up physical be scheduled for February and he will come in sooner for flu vaccine and lab draws note the patient has his COVID vaccines are fully up-to-date including a booster  Patient is interested in a colonoscopy and is due this we will make the referral he does have insurance I discussed the assessment and treatment plan with the patient. The patient was provided an opportunity to ask questions and all were answered. The patient agreed with the plan and demonstrated an understanding of the instructions.   The patient was advised to call back or seek an in-person evaluation if the symptoms worsen or if the condition fails to improve as anticipated.  I provided 30 minutes of non-face-to-face time during this encounter  including  median intraservice time , review of notes, labs, imaging,  medications  and explaining diagnosis and management to the patient .    Shan Levans, MD

## 2021-01-10 MED FILL — BIKTARVY 50-200-25 MG TABS: 50-200-25 | 30 days supply | Qty: 30 | Fill #3 | Status: TO

## 2021-01-11 ENCOUNTER — Other Ambulatory Visit: Payer: Self-pay

## 2021-01-11 ENCOUNTER — Ambulatory Visit (HOSPITAL_COMMUNITY)
Admission: RE | Admit: 2021-01-11 | Discharge: 2021-01-11 | Disposition: A | Discharge status: Home / Self Care | Payer: Medicare Other | Source: Ambulatory Visit | Attending: Critical Care Medicine | Admitting: Critical Care Medicine

## 2021-01-11 DIAGNOSIS — M25559 Pain in unspecified hip: Secondary | ICD-10-CM

## 2021-02-09 ENCOUNTER — Ambulatory Visit: Payer: Medicare Other | Admitting: Critical Care Medicine

## 2021-02-09 NOTE — Progress Notes (Deleted)
Subjective:    Patient ID: Ryan Long, male    DOB: Oct 11, 1961, 60 y.o.   MRN: [id number] Virtual Visit via Telephone Note  I connected with Neysa Bonito Wee on 02/09/21 at 10:30 AM EST by telephone and verified that I am speaking with the correct person using two identifiers.   Consent:  I discussed the limitations, risks, security and privacy concerns of performing an evaluation and management service by telephone and the availability of in person appointments. I also discussed with the patient that there may be a patient responsible charge related to this service. The patient expressed understanding and agreed to proceed.  Location of patient: Patient's at home  Location of provider: I am in my office  Persons participating in the televisit with the patient.   No one else on the call    History of Present Illness:    Observations/Objective:   Assessment and Plan:     60 y.o.M here to est PCP  Hx HTN, CHF, NICM, AICD in place,  allergic rhinitis, COPD, subst use mood d/o, ETOH use, HIV, Cocaine use, MDD, PTSD, Bipolar, prior suicide attempts,   01/06/2021 Patient is here to establish primary care by way of a telephone visit.  He fell yesterday on the ice and his leg is too sore to come in for a visit Patient notes he is having left-sided hip pain since he fell on the ice but has not sought attention as of yet.  He is able to stand and pivot on the leg.  Patient moved from [place] to [place] in August 2021 and he is now seeking primary care.  He has a prior history of ethanol abuse but he states he now only drinks about 1 beer a month.  Also history of tobacco use about three quarters a pack a day of cigarettes are being utilized.  The patient has history of nonischemic cardiomyopathy systolic with an AICD in place.  He is now being followed by local [facility] cardiology.  History of PTSD bipolar major depressive disorder.  He has had prior suicide attempts  but he is not suicidal at this time.  This patient has history of allergic rhinitis COPD cocaine use HIV and hypertension.  He does not have diabetes.  Patient does have recurrent outbreaks of herpes viral infection in the penis area and is on chronic Valtrex suppression.  He is on Biktarvy for HIV and is followed by our CID has upcoming appointments.  He is followed in the heart vascular advanced heart failure clinic and has follow-up appointments in March noted.  He has an echocardiogram pending in March.  Last echocardiogram showed an EF of 20%.  Patient CD4 counts are normal and he is on his Biktarvy and is compliant with therapy.  The patient is needing refills on all his medications and has no other specific complaints at this time.  02/09/2021  Chronic systolic heart failure (HCC) Chronic systolic heart failure plan per advanced heart failure clinic  Continue Entresto and Toprol Note anticoagulation aspirin Lasix has been stopped  Follow-up echocardiogram Follow-up in the AICD clinic  Heart failure (HCC) As per chronic systolic heart failure assessment  Hypertension Hypertension well controlled by patient report follow-up in the office  VT (ventricular tachycardia) (HCC) No evidence of recurrent ventricular tachycardia no recent firing of the AICD  Chronic obstructive pulmonary disease (HCC) Refills on inhalers made  BPH (benign prostatic hyperplasia) Refill of Flomax  Bipolar 1 disorder (HCC) Bipolar disorder stable  at this time Continue Atarax as needed  After I meet the patient I will encourage mental health follow-up  Human immunodeficiency virus (HIV) disease (HCC) Care per HIV clinic follow-up arranged  Recurrent HSV (herpes simplex virus) Continue Valtrex suppressive therapy  Tobacco use disorder  MDD (major depressive disorder), recurrent episode, severe (HCC) History of severe depression currently stable at this time we will follow-up in the  office  Abnormal thyroid stimulating hormone (TSH) level History of abnormal TSH levels  Follow-up thyroid function  Hip pain Status post fall with hip pain  X-ray of left hip and pelvis will be obtained   Diagnoses and all orders for this visit:  Chronic systolic heart failure (HCC) -     Comprehensive metabolic panel; Future -     CBC with Differential/Platelet; Future  HSV infection -     valACYclovir (VALTREX) 500 MG tablet; TAKE 1 TABLET BY MOUTH DAILY FOR HERPES.  Hip pain -     DG Hip Unilat W OR W/O Pelvis 2-3 Views Left; Future  Secondary hypertension -     Comprehensive metabolic panel; Future  Colon cancer screening -     Ambulatory referral to Gastroenterology  VT (ventricular tachycardia) (HCC)  Chronic obstructive pulmonary disease, unspecified COPD type (HCC)  Benign prostatic hyperplasia with urinary hesitancy  Bipolar 1 disorder (HCC)  Human immunodeficiency virus (HIV) disease (HCC)  Recurrent HSV (herpes simplex virus)  Tobacco use disorder  Severe episode of recurrent major depressive disorder, without psychotic features (HCC)  Abnormal thyroid stimulating hormone (TSH) level -     Thyroid Panel With TSH; Future  Other orders -     albuterol (VENTOLIN HFA) 108 (90 Base) MCG/ACT inhaler; Inhale 1-2 puffs into the lungs every 6 (six) hours as needed for wheezing or shortness of breath. -     metoprolol succinate (TOPROL-XL) 25 MG 24 hr tablet; Take 1 tablet (25 mg total) by mouth daily. For high blood pressure -     traZODone (DESYREL) 100 MG tablet; Take 1 tablet (100 mg total) by mouth at bedtime as needed for sleep. -     tamsulosin (FLOMAX) 0.4 MG CAPS capsule; Take 2 capsules (0.8 mg total) by mouth daily after supper. For Prostate health   Past Medical History:  Diagnosis Date  . Active smoker   . AICD (automatic cardioverter/defibrillator) present   . Alcohol abuse   . Anxiety   . AR (allergic rhinitis)   . Bipolar 1 disorder  (HCC)   . CHF (congestive heart failure) (HCC)   . Chronic bronchitis (HCC)   . Chronic systolic heart failure (HCC)   . Controlled substance agreement signed 10/22/2018  . COPD (chronic obstructive pulmonary disease) (HCC)   . Cough 12/31/2018  . Crack cocaine use   . Depression   . Genital herpes   . HIV (human immunodeficiency virus infection) (HCC) dx'd 08/1993  . HLD (hyperlipidemia)   . Hypertension   . NICM (nonischemic cardiomyopathy) (HCC)    Echocardiogram 06/28/11: EF 30-35%, mild MR, mild LAE;  No CAD by coronary CT angiogram 3/12 at [facility]  . NSVT (nonsustained ventricular tachycardia) (HCC)   . PTSD (post-traumatic stress disorder)      Family History  Problem Relation Age of Onset  . Glaucoma Mother   . Mental illness Mother   . Vision loss Mother   . Hypertension Father   . CAD Father   . Mental illness Father   . Alcohol abuse Father   .  Drug abuse Father   . Heart attack Father 65  . Early death Father   . Alcohol abuse Brother   . Drug abuse Brother   . Drug abuse Brother      Social History   Socioeconomic History  . Marital status: Single    Spouse name: Not on file  . Number of children: Not on file  . Years of education: Not on file  . Highest education level: Not on file  Occupational History  . Not on file  Tobacco Use  . Smoking status: Current Every Day Smoker    Packs/day: 0.75    Years: 38.00    Pack years: 28.50    Types: Cigarettes  . Smokeless tobacco: Never Used  . Tobacco comment: hoping welbutrin will help  Vaping Use  . Vaping Use: Never used  Substance and Sexual Activity  . Alcohol use: Not Currently    Alcohol/week: 6.0 standard drinks    Types: 6 Cans of beer per week    Comment: 01/08/2018 "stopped 06/2017"  . Drug use: Not Currently    Types: Cocaine    Comment: 01/08/2018 "stopped 06/2017"  . Sexual activity: Yes    Birth control/protection: Condom    Comment: pt. declined condoms  Other Topics  Concern  . Not on file  Social History Narrative  . Not on file   Social Determinants of Health   Financial Resource Strain: Not on file  Food Insecurity: Not on file  Transportation Needs: Not on file  Physical Activity: Not on file  Stress: Not on file  Social Connections: Not on file  Intimate Partner Violence: Not on file     No Known Allergies   Outpatient Medications Prior to Visit  Medication Sig Dispense Refill  . albuterol (VENTOLIN HFA) 108 (90 Base) MCG/ACT inhaler Inhale 1-2 puffs into the lungs every 6 (six) hours as needed for wheezing or shortness of breath. 1 g 0  . aspirin EC 81 MG tablet Take 81 mg by mouth daily. Swallow whole.    . bictegravir-emtricitabine-tenofovir AF (BIKTARVY) 50-200-25 MG TABS tablet Take 1 tablet by mouth daily. 30 tablet 11  . eplerenone (INSPRA) 25 MG tablet TAKE 1/2 TABLET BY MOUTH DAILY. 15 tablet 11  . hydrOXYzine (ATARAX/VISTARIL) 25 MG tablet TAKE 1-2 TABLETS BY MOUTH 2 TIMES DAILY AS NEEDED FOR ANXIETY. 60 tablet 3  . metoprolol succinate (TOPROL-XL) 25 MG 24 hr tablet Take 1 tablet (25 mg total) by mouth daily. For high blood pressure 90 tablet 2  . Multiple Vitamins-Minerals (MENS MULTIPLUS PO) Take by mouth.    . sacubitril-valsartan (ENTRESTO) 24-26 MG Take 1 tablet by mouth 2 (two) times daily. 90 tablet 0  . tamsulosin (FLOMAX) 0.4 MG CAPS capsule Take 2 capsules (0.8 mg total) by mouth daily after supper. For Prostate health 60 capsule 3  . traZODone (DESYREL) 100 MG tablet Take 1 tablet (100 mg total) by mouth at bedtime as needed for sleep. 30 tablet 3  . valACYclovir (VALTREX) 500 MG tablet TAKE 1 TABLET BY MOUTH DAILY FOR HERPES. 30 tablet 1   No facility-administered medications prior to visit.      Review of Systems  Constitutional: Negative.   HENT: Negative.   Eyes: Negative.   Respiratory: Negative.   Cardiovascular: Negative.   Gastrointestinal: Negative.   Endocrine: Negative.   Musculoskeletal:        Left hip pain s/p fall   Neurological: Negative.   Psychiatric/Behavioral: Negative.  Objective:   Physical Exam There were no vitals filed for this visit. No exam this is a phone visit      Assessment & Plan:  I personally reviewed all images and lab data in the [facility] system as well as any outside material available during this office visit and agree with the  radiology impressions.   No problem-specific Assessment & Plan notes found for this encounter.   There are no diagnoses linked to this encounter.Follow Up Instructions: Patient is a follow-up physical be scheduled for February and he will come in sooner for flu vaccine and lab draws note the patient has his COVID vaccines are fully up-to-date including a booster  Patient is interested in a colonoscopy and is due this we will make the referral he does have insurance I discussed the assessment and treatment plan with the patient. The patient was provided an opportunity to ask questions and all were answered. The patient agreed with the plan and demonstrated an understanding of the instructions.   The patient was advised to call back or seek an in-person evaluation if the symptoms worsen or if the condition fails to improve as anticipated.  I provided 30 minutes of non-face-to-face time during this encounter  including  median intraservice time , review of notes, labs, imaging, medications  and explaining diagnosis and management to the patient .    Shan Levans, MD

## 2021-02-21 ENCOUNTER — Encounter (HOSPITAL_COMMUNITY): Payer: Medicare Other | Admitting: Internal Medicine

## 2021-02-24 ENCOUNTER — Encounter (HOSPITAL_COMMUNITY): Payer: Medicare Other | Admitting: Internal Medicine

## 2021-02-24 ENCOUNTER — Encounter (HOSPITAL_COMMUNITY): Payer: Self-pay

## 2021-02-24 ENCOUNTER — Ambulatory Visit (HOSPITAL_COMMUNITY): Admission: RE | Admit: 2021-02-24 | Payer: Medicare Other | Source: Ambulatory Visit

## 2021-02-28 ENCOUNTER — Other Ambulatory Visit: Payer: Medicare Other

## 2021-03-08 ENCOUNTER — Encounter: Payer: Medicare Other | Admitting: Internal Medicine

## 2021-03-10 ENCOUNTER — Other Ambulatory Visit: Payer: Self-pay

## 2021-03-10 ENCOUNTER — Other Ambulatory Visit: Payer: Medicare Other

## 2021-03-10 DIAGNOSIS — F17200 Nicotine dependence, unspecified, uncomplicated: Secondary | ICD-10-CM

## 2021-03-10 DIAGNOSIS — F333 Major depressive disorder, recurrent, severe with psychotic symptoms: Secondary | ICD-10-CM

## 2021-03-10 DIAGNOSIS — I428 Other cardiomyopathies: Secondary | ICD-10-CM

## 2021-03-10 DIAGNOSIS — Z79899 Other long term (current) drug therapy: Secondary | ICD-10-CM

## 2021-03-10 DIAGNOSIS — F4310 Post-traumatic stress disorder, unspecified: Secondary | ICD-10-CM

## 2021-03-10 DIAGNOSIS — F1021 Alcohol dependence, in remission: Secondary | ICD-10-CM

## 2021-03-10 DIAGNOSIS — Z95810 Presence of automatic (implantable) cardiac defibrillator: Secondary | ICD-10-CM

## 2021-03-10 DIAGNOSIS — B20 Human immunodeficiency virus [HIV] disease: Secondary | ICD-10-CM

## 2021-03-11 ENCOUNTER — Other Ambulatory Visit: Payer: Medicare Other

## 2021-03-11 LAB — T-HELPER CELL (CD4) - (RCID CLINIC ONLY)
CD4 % Helper T Cell: 40 % (ref 33–65)
CD4 T Cell Abs: 537 /uL (ref 400–1790)

## 2021-03-14 ENCOUNTER — Encounter: Payer: Medicare Other | Admitting: Infectious Disease

## 2021-03-14 LAB — CBC WITH DIFFERENTIAL/PLATELET
Absolute Monocytes: 257 cells/uL (ref 200–950)
Basophils Absolute: 40 cells/uL (ref 0–200)
Basophils Relative: 1.8 %
Eosinophils Absolute: 29 cells/uL (ref 15–500)
Eosinophils Relative: 1.3 %
HCT: 52.7 % — ABNORMAL HIGH (ref 38.5–50.0)
Hemoglobin: 18.3 g/dL — ABNORMAL HIGH (ref 13.2–17.1)
Lymphs Abs: 1450 cells/uL (ref 850–3900)
MCH: 33.9 pg — ABNORMAL HIGH (ref 27.0–33.0)
MCHC: 34.7 g/dL (ref 32.0–36.0)
MCV: 97.6 fL (ref 80.0–100.0)
MPV: 10.1 fL (ref 7.5–12.5)
Monocytes Relative: 11.7 %
Neutro Abs: 425 cells/uL — ABNORMAL LOW (ref 1500–7800)
Neutrophils Relative %: 19.3 %
Platelets: 142 10*3/uL (ref 140–400)
RBC: 5.4 10*6/uL (ref 4.20–5.80)
RDW: 14 % (ref 11.0–15.0)
Total Lymphocyte: 65.9 %
WBC: 2.2 10*3/uL — ABNORMAL LOW (ref 3.8–10.8)

## 2021-03-14 LAB — COMPLETE METABOLIC PANEL WITH GFR
AG Ratio: 2 (calc) (ref 1.0–2.5)
ALT: 15 U/L (ref 9–46)
AST: 18 U/L (ref 10–35)
Albumin: 4 g/dL (ref 3.6–5.1)
Alkaline phosphatase (APISO): 43 U/L (ref 35–144)
BUN: 13 mg/dL (ref 7–25)
CO2: 23 mmol/L (ref 20–32)
Calcium: 8.8 mg/dL (ref 8.6–10.3)
Chloride: 110 mmol/L (ref 98–110)
Creat: 1.33 mg/dL (ref 0.70–1.33)
GFR, Est African American: 67 mL/min/{1.73_m2} (ref >=60)
GFR, Est Non African American: 58 mL/min/{1.73_m2} — ABNORMAL LOW (ref >=60)
Globulin: 2 g/dL (calc) (ref 1.9–3.7)
Glucose, Bld: 86 mg/dL (ref 65–99)
Potassium: 3.9 mmol/L (ref 3.5–5.3)
Sodium: 141 mmol/L (ref 135–146)
Total Bilirubin: 1 mg/dL (ref 0.2–1.2)
Total Protein: 6 g/dL — ABNORMAL LOW (ref 6.1–8.1)

## 2021-03-14 LAB — LIPID PANEL
Cholesterol: 162 mg/dL (ref <200)
HDL: 49 mg/dL (ref >=40)
LDL Cholesterol (Calc): 96 mg/dL (calc)
Non-HDL Cholesterol (Calc): 113 mg/dL (calc) (ref <130)
Total CHOL/HDL Ratio: 3.3 (calc) (ref <5.0)
Triglycerides: 76 mg/dL (ref <150)

## 2021-03-14 LAB — HIV-1 RNA QUANT-NO REFLEX-BLD
HIV 1 RNA Quant: NOT DETECTED Copies/mL
HIV-1 RNA Quant, Log: NOT DETECTED Log cps/mL

## 2021-03-14 LAB — RPR: RPR Ser Ql: NONREACTIVE

## 2021-03-15 ENCOUNTER — Other Ambulatory Visit: Payer: Self-pay | Admitting: Critical Care Medicine

## 2021-03-15 DIAGNOSIS — B009 Herpesviral infection, unspecified: Secondary | ICD-10-CM

## 2021-03-15 NOTE — Telephone Encounter (Signed)
Requested Prescriptions  Pending Prescriptions Disp Refills  . valACYclovir (VALTREX) 500 MG tablet [Pharmacy Med Name: VALACYCLOVIR 500MG  TABLETS] 90 tablet 0    Sig: TAKE 1 TABLET BY MOUTH EVERY DAY FOR HERPES     Antimicrobials:  Antiviral Agents - Anti-Herpetic Passed - 03/15/2021  8:52 PM      Passed - Valid encounter within last 12 months    Recent Outpatient Visits          2 months ago Chronic systolic heart failure [facility])   [facility] [facility], MD   1 year ago    [facility]   1 year ago Chronic pain of left ankle   [place] [facility] [place] [place], [place]   3 years ago NICM (nonischemic cardiomyopathy) [facility])   [place] [facility], MD   3 years ago Mastodynia of left breast    [facility], MD      Future Appointments            In 1 week Hoy Register Delford Field, MD [facility]

## 2021-03-18 ENCOUNTER — Other Ambulatory Visit: Payer: Self-pay

## 2021-03-18 ENCOUNTER — Emergency Department (HOSPITAL_COMMUNITY)
Admission: EM | Admit: 2021-03-18 | Discharge: 2021-03-18 | Disposition: A | Discharge status: Home / Self Care | Payer: Medicare Other | Attending: Emergency Medicine | Admitting: Emergency Medicine

## 2021-03-18 ENCOUNTER — Encounter (HOSPITAL_COMMUNITY): Payer: Self-pay

## 2021-03-18 DIAGNOSIS — R350 Frequency of micturition: Secondary | ICD-10-CM | POA: Insufficient documentation

## 2021-03-18 DIAGNOSIS — Z113 Encounter for screening for infections with a predominantly sexual mode of transmission: Secondary | ICD-10-CM | POA: Diagnosis not present

## 2021-03-18 DIAGNOSIS — F17210 Nicotine dependence, cigarettes, uncomplicated: Secondary | ICD-10-CM | POA: Diagnosis not present

## 2021-03-18 DIAGNOSIS — Z79899 Other long term (current) drug therapy: Secondary | ICD-10-CM | POA: Diagnosis not present

## 2021-03-18 DIAGNOSIS — I5022 Chronic systolic (congestive) heart failure: Secondary | ICD-10-CM | POA: Insufficient documentation

## 2021-03-18 DIAGNOSIS — Z21 Asymptomatic human immunodeficiency virus [HIV] infection status: Secondary | ICD-10-CM | POA: Diagnosis not present

## 2021-03-18 DIAGNOSIS — J449 Chronic obstructive pulmonary disease, unspecified: Secondary | ICD-10-CM | POA: Diagnosis not present

## 2021-03-18 DIAGNOSIS — R300 Dysuria: Secondary | ICD-10-CM | POA: Insufficient documentation

## 2021-03-18 DIAGNOSIS — R369 Urethral discharge, unspecified: Secondary | ICD-10-CM | POA: Diagnosis not present

## 2021-03-18 DIAGNOSIS — Z7982 Long term (current) use of aspirin: Secondary | ICD-10-CM | POA: Insufficient documentation

## 2021-03-18 DIAGNOSIS — I110 Hypertensive heart disease with heart failure: Secondary | ICD-10-CM | POA: Insufficient documentation

## 2021-03-18 DIAGNOSIS — Z711 Person with feared health complaint in whom no diagnosis is made: Secondary | ICD-10-CM

## 2021-03-18 LAB — URINALYSIS, ROUTINE W REFLEX MICROSCOPIC
Bacteria, UA: NONE SEEN
Bilirubin Urine: NEGATIVE
Glucose, UA: NEGATIVE mg/dL
Ketones, ur: NEGATIVE mg/dL
Leukocytes,Ua: NEGATIVE
Nitrite: NEGATIVE
Protein, ur: NEGATIVE mg/dL
Specific Gravity, Urine: 1.01 (ref 1.005–1.030)
pH: 5 (ref 5.0–8.0)

## 2021-03-18 MED ORDER — DOXYCYCLINE HYCLATE 100 MG PO CAPS: 100.0000 mg | ORAL_CAPSULE | Freq: Two times a day (BID) | ORAL | 0 refills | Status: AC

## 2021-03-18 MED ORDER — STERILE WATER FOR INJECTION IJ SOLN
INTRAMUSCULAR | Status: AC
  Administered 2021-03-18: 10 mL
  Filled 2021-03-18: qty 10

## 2021-03-18 MED ORDER — DOXYCYCLINE HYCLATE 100 MG PO TABS
100.0000 mg | ORAL_TABLET | Freq: Once | ORAL | Status: AC
  Administered 2021-03-18: 100 mg via ORAL
  Filled 2021-03-18: qty 1

## 2021-03-18 MED ORDER — CEFTRIAXONE SODIUM 500 MG IJ SOLR
500.0000 mg | Freq: Once | INTRAMUSCULAR | Status: AC
  Administered 2021-03-18: 500 mg via INTRAMUSCULAR
  Filled 2021-03-18: qty 500

## 2021-03-18 NOTE — Discharge Instructions (Signed)
You have been treated today for an STD.   The test results with take 2-3 days to return. If there is an abnormal result, you will be notified. If you do not hear anything, that means the results were negative. You can also log on MyChart to see the results.   Your sexual partner needs to be treated too. Do not have sexual intercourse for the next 7 days and after your partner has been treated.   Follow-up with your primary care doctor in 2-4 days. If you do not have a primary care doctor, you can use one listed in the paperwork.   Return to the Emergency Department for any fever, abdominal pain, difficulty breathing, nausea/vomiting or any other worsening or concerning symptoms.   

## 2021-03-18 NOTE — ED Triage Notes (Addendum)
Pt is here today due to urinary freq. & burning. Pt reports that it started Tuesday.After drawing blood pt then states : I shouldn't have messed with that girl". Pt reports he had unprotected  sex and now is having burning. Pt thinks it was exposure to std. Pt bloodwork discontinued at this time.

## 2021-03-18 NOTE — ED Provider Notes (Signed)
[facility] EMERGENCY DEPARTMENT Provider Note   CSN: [id number] Arrival date & time: 03/18/21  0710     History Chief Complaint  Patient presents with  . Exposure to STD    Ryan Long is a 60 y.o. male with PMH/o CHF, COPD, Depression, HIV (CD4 of 537 as 03/10/21) who presents for evaluation of dysuria, urinary frequency, penile discharge that has been ongoing for the last few days.  He states that he recently had unprotected sex with a new partner.  Shortly after, he started experiencing symptoms.  He states that the discharge is a watery white discharge noted.  He has not noted any rashes or lesions to his penis and denies any pain, redness, swelling noticed to his testicles.  He is not having fevers, abdominal pain, nausea/vomiting.  The history is provided by the patient.       Past Medical History:  Diagnosis Date  . Active smoker   . AICD (automatic cardioverter/defibrillator) present   . Alcohol abuse   . Anxiety   . AR (allergic rhinitis)   . Bipolar 1 disorder (HCC)   . CHF (congestive heart failure) (HCC)   . Chronic bronchitis (HCC)   . Chronic systolic heart failure (HCC)   . Controlled substance agreement signed 10/22/2018  . COPD (chronic obstructive pulmonary disease) (HCC)   . Cough 12/31/2018  . Crack cocaine use   . Depression   . Genital herpes   . HIV (human immunodeficiency virus infection) (HCC) dx'd 08/1993  . HLD (hyperlipidemia)   . Hypertension   . NICM (nonischemic cardiomyopathy) (HCC)    Echocardiogram 06/28/11: EF 30-35%, mild MR, mild LAE;  No CAD by coronary CT angiogram 3/12 at [name]  . NSVT (nonsustained ventricular tachycardia) (HCC)   . PTSD (post-traumatic stress disorder)     Patient Active Problem List   Diagnosis Date Noted  . Hip pain 01/06/2021  . VT (ventricular tachycardia) (HCC) 08/25/2020  . HIV positive (HCC) 07/12/2020  . Atrial fibrillation (HCC) 06/10/2020  . BPH  (benign prostatic hyperplasia) 01/28/2020  . HFrEF (heart failure with reduced ejection fraction) (HCC) 01/28/2020  . 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 AICD (automatic cardioverter/defibrillator) present 08/15/2018  . Abnormal EKG 06/28/2018  . Adjustment disorder with mixed disturbance of emotions and conduct   . NICM (nonischemic cardiomyopathy) (HCC) 01/08/2018  . Anxiety 12/20/2017  . Bipolar 1 disorder (HCC) 10/10/2017  . Chronic systolic heart failure (HCC) 05/13/2017  . HSV-2 infection 07/17/2016  . History of attempted suicide 04/14/2016  . History of alcoholism (HCC) 04/14/2016  . History of substance abuse (HCC) 04/14/2016  . Constipation 01/17/2016  . Chronic obstructive pulmonary disease (HCC) 10/04/2015  . Abnormal thyroid stimulating hormone (TSH) level 10/04/2015  . MDD (major depressive disorder), recurrent episode, severe (HCC) 09/23/2015  . Hypertension 09/10/2015  . PTSD (post-traumatic stress disorder) 08/07/2015  . Primary insomnia 04/02/2015  . Herpesviral infection of penis 04/02/2015  . Generalized anxiety disorder 05/29/2014  . Tobacco use disorder 02/17/2013  . Heart failure (HCC) 09/19/2011  . Other primary cardiomyopathies 08/24/2011  . Human immunodeficiency virus (HIV) disease (HCC) 02/01/2009  . Recurrent HSV (herpes simplex virus) 02/01/2009  . Hyperlipidemia 02/01/2009  . Allergic rhinitis 02/01/2009    Past Surgical History:  Procedure Laterality Date  . CARDIAC DEFIBRILLATOR PLACEMENT  01/08/2018  . ICD IMPLANT N/A 01/08/2018   Procedure: ICD IMPLANT;  Surgeon: Regan Lemming, MD;  Location: [facility] INVASIVE CV LAB;  Service: Cardiovascular;  Laterality: N/A;       Family History  Problem Relation Age of Onset  . Glaucoma Mother   . Mental illness Mother   . Vision loss Mother   . Hypertension Father   . CAD Father   . Mental illness Father   . Alcohol abuse Father   . Drug abuse Father   . Heart attack Father 59  . Early  death Father   . Alcohol abuse Brother   . Drug abuse Brother   . Drug abuse Brother     Social History   Tobacco Use  . Smoking status: Current Every Day Smoker    Packs/day: 0.75    Years: 38.00    Pack years: 28.50    Types: Cigarettes  . Smokeless tobacco: Never Used  . Tobacco comment: hoping welbutrin will help  Vaping Use  . Vaping Use: Never used  Substance Use Topics  . Alcohol use: Not Currently    Alcohol/week: 6.0 standard drinks    Types: 6 Cans of beer per week    Comment: 01/08/2018 "stopped 06/2017"  . Drug use: Not Currently    Types: Cocaine    Comment: 01/08/2018 "stopped 06/2017"    Home Medications Prior to Admission medications   Medication Sig Start Date End Date Taking? Authorizing Provider  doxycycline (VIBRAMYCIN) 100 MG capsule Take 1 capsule (100 mg total) by mouth 2 (two) times daily for 7 days. 03/18/21 03/25/21 Yes Maxwell Caul, PA-C  albuterol (VENTOLIN HFA) 108 (90 Base) MCG/ACT inhaler Inhale 1-2 puffs into the lungs every 6 (six) hours as needed for wheezing or shortness of breath. 01/06/21   Storm Frisk, MD  aspirin EC 81 MG tablet Take 81 mg by mouth daily. Swallow whole.    [provider]  bictegravir-emtricitabine-tenofovir AF (BIKTARVY) 50-200-25 MG TABS tablet Take 1 tablet by mouth daily. 03/08/20   Randall Hiss, MD  eplerenone (INSPRA) 25 MG tablet TAKE 1/2 TABLET BY MOUTH DAILY. 10/12/20   Bensimhon, Bevelyn Buckles, MD  hydrOXYzine (ATARAX/VISTARIL) 25 MG tablet TAKE 1-2 TABLETS BY MOUTH 2 TIMES DAILY AS NEEDED FOR ANXIETY. 12/03/20   Clegg, Amy D, NP  metoprolol succinate (TOPROL-XL) 25 MG 24 hr tablet Take 1 tablet (25 mg total) by mouth daily. For high blood pressure 01/06/21   Storm Frisk, MD  Multiple Vitamins-Minerals (MENS MULTIPLUS PO) Take by mouth.    [provider]  sacubitril-valsartan (ENTRESTO) 24-26 MG Take 1 tablet by mouth 2 (two) times daily. 01/05/21   Bensimhon, Bevelyn Buckles, MD  tamsulosin  (FLOMAX) 0.4 MG CAPS capsule Take 2 capsules (0.8 mg total) by mouth daily after supper. For Prostate health 01/06/21   Storm Frisk, MD  traZODone (DESYREL) 100 MG tablet Take 1 tablet (100 mg total) by mouth at bedtime as needed for sleep. 01/06/21   Storm Frisk, MD  valACYclovir (VALTREX) 500 MG tablet TAKE 1 TABLET BY MOUTH EVERY DAY FOR HERPES 03/15/21   Storm Frisk, MD    Allergies    Patient has no known allergies.  Review of Systems   Review of Systems  Constitutional: Negative for fever.  Gastrointestinal: Negative for abdominal pain, nausea and vomiting.  Genitourinary: Positive for dysuria and penile discharge. Negative for hematuria, penile pain, penile swelling, scrotal swelling and testicular pain.  All other systems reviewed and are negative.   Physical Exam Updated Vital Signs BP 114/80 (BP Location: Right Arm)   Pulse 91   Temp 98.2  F (36.8 C) (Oral)   Resp 16   SpO2 98%   Physical Exam Vitals and nursing note reviewed.  Constitutional:      Appearance: He is well-developed.  HENT:     Head: Normocephalic and atraumatic.  Eyes:     General: No scleral icterus.       Right eye: No discharge.        Left eye: No discharge.     Conjunctiva/sclera: Conjunctivae normal.  Pulmonary:     Effort: Pulmonary effort is normal.  Abdominal:     Comments: Abdomen is soft, non-distended, non-tender. No rigidity, No guarding. No peritoneal signs.  Genitourinary:    Comments: The exam was performed with a chaperone present Lowanda Foster, Charity fundraiser). Normal male genitalia. No evidence of rash, ulcers or lesions. No testicular erythema, edema, tenderness.  Skin:    General: Skin is warm and dry.  Neurological:     Mental Status: He is alert.  Psychiatric:        Speech: Speech normal.        Behavior: Behavior normal.     ED Results / Procedures / Treatments   Labs (all labs ordered are listed, but only abnormal results are displayed) Labs Reviewed   URINALYSIS, ROUTINE W REFLEX MICROSCOPIC - Abnormal; Notable for the following components:      Result Value   Hgb urine dipstick SMALL (*)    All other components within normal limits  GC/CHLAMYDIA PROBE AMP ([place]) NOT AT [facility]    EKG None  Radiology No results found.  Procedures Procedures   Medications Ordered in ED Medications  cefTRIAXone (ROCEPHIN) injection 500 mg (500 mg Intramuscular Given 03/18/21 0827)  doxycycline (VIBRA-TABS) tablet 100 mg (100 mg Oral Given 03/18/21 0827)  sterile water (preservative free) injection (10 mLs  Given 03/18/21 0827)    ED Course  I have reviewed the triage vital signs and the nursing notes.  Pertinent labs & imaging results that were available during my care of the patient were reviewed by me and considered in my medical decision making (see chart for details).    MDM Rules/Calculators/A&P                          60 year old male past medical history of HIV, COPD who presents for evaluation of dysuria, increased urinary frequency, penile discharge is been ongoing for the last few days.  States he recently had unprotected sex started having symptoms shortly after.  No fever, abdominal pain, nausea/vomiting.  On initial arrival, he is afebrile, nontoxic-appearing.  Vital signs are stable.  Benign abdominal exam.  GU exam is unremarkable.  Concern for STD.  Urine, GC/committee collected.  I discussed treatment options with patient.  He would rather be treated today.  Patient would had no drug allergies.  Urine shows small hemoglobin.  Otherwise unremarkable.  I discussed the results with patient.  He has no abdominal pain, CVA tenderness.  Do not suspect this is kidney stone based on history/physical exam.  Given that he has had having discharge, symptomatic and is concerned for STD exposure, patient would like to be treated.  Patient with no known drug allergies.  Encouraged at home supportive care measures. At this time, patient  exhibits no emergent life-threatening condition that require further evaluation in ED. Patient had ample opportunity for questions and discussion. All patient's questions were answered with full understanding. Strict return precautions discussed. Patient expresses understanding and agreement to plan.  Portions of this note were generated with Scientist, clinical (histocompatibility and immunogenetics). Dictation errors may occur despite best attempts at proofreading.  Final Clinical Impression(s) / ED Diagnoses Final diagnoses:  Concern about STD in male without diagnosis    Rx / DC Orders ED Discharge Orders         Ordered    doxycycline (VIBRAMYCIN) 100 MG capsule  2 times daily        03/18/21 0818           Maxwell Caul, PA-C 03/18/21 0911    Terald Sleeper, MD 03/18/21 1236

## 2021-03-20 LAB — GC/CHLAMYDIA PROBE AMP (~~LOC~~) NOT AT ARMC
Chlamydia: NEGATIVE
Comment: NEGATIVE
Comment: NORMAL
Neisseria Gonorrhea: NEGATIVE

## 2021-03-22 ENCOUNTER — Encounter: Payer: Medicare Other | Admitting: Infectious Disease

## 2021-03-22 ENCOUNTER — Ambulatory Visit: Payer: Medicare Other | Attending: Critical Care Medicine | Admitting: Critical Care Medicine

## 2021-03-22 ENCOUNTER — Encounter: Payer: Self-pay | Admitting: Critical Care Medicine

## 2021-03-22 ENCOUNTER — Other Ambulatory Visit: Payer: Self-pay

## 2021-03-22 VITALS — BP 123/71 | HR 96 | Resp 20 | Ht 69.0 in | Wt 205.8 lb

## 2021-03-22 DIAGNOSIS — B20 Human immunodeficiency virus [HIV] disease: Secondary | ICD-10-CM | POA: Diagnosis present

## 2021-03-22 DIAGNOSIS — Z7901 Long term (current) use of anticoagulants: Secondary | ICD-10-CM | POA: Insufficient documentation

## 2021-03-22 DIAGNOSIS — F21 Schizotypal disorder: Secondary | ICD-10-CM

## 2021-03-22 DIAGNOSIS — I5022 Chronic systolic (congestive) heart failure: Secondary | ICD-10-CM | POA: Diagnosis not present

## 2021-03-22 DIAGNOSIS — Z8249 Family history of ischemic heart disease and other diseases of the circulatory system: Secondary | ICD-10-CM | POA: Diagnosis not present

## 2021-03-22 DIAGNOSIS — Z760 Encounter for issue of repeat prescription: Secondary | ICD-10-CM | POA: Insufficient documentation

## 2021-03-22 DIAGNOSIS — Z7982 Long term (current) use of aspirin: Secondary | ICD-10-CM | POA: Insufficient documentation

## 2021-03-22 DIAGNOSIS — F1490 Cocaine use, unspecified, uncomplicated: Secondary | ICD-10-CM | POA: Insufficient documentation

## 2021-03-22 DIAGNOSIS — I110 Hypertensive heart disease with heart failure: Secondary | ICD-10-CM | POA: Insufficient documentation

## 2021-03-22 DIAGNOSIS — F19951 Other psychoactive substance use, unspecified with psychoactive substance-induced psychotic disorder with hallucinations: Secondary | ICD-10-CM

## 2021-03-22 DIAGNOSIS — Z7289 Other problems related to lifestyle: Secondary | ICD-10-CM | POA: Insufficient documentation

## 2021-03-22 DIAGNOSIS — F333 Major depressive disorder, recurrent, severe with psychotic symptoms: Secondary | ICD-10-CM

## 2021-03-22 DIAGNOSIS — J449 Chronic obstructive pulmonary disease, unspecified: Secondary | ICD-10-CM | POA: Diagnosis not present

## 2021-03-22 DIAGNOSIS — J411 Mucopurulent chronic bronchitis: Secondary | ICD-10-CM

## 2021-03-22 DIAGNOSIS — I159 Secondary hypertension, unspecified: Secondary | ICD-10-CM | POA: Diagnosis not present

## 2021-03-22 DIAGNOSIS — F17210 Nicotine dependence, cigarettes, uncomplicated: Secondary | ICD-10-CM | POA: Diagnosis not present

## 2021-03-22 DIAGNOSIS — I472 Ventricular tachycardia, unspecified: Secondary | ICD-10-CM

## 2021-03-22 DIAGNOSIS — Z95810 Presence of automatic (implantable) cardiac defibrillator: Secondary | ICD-10-CM | POA: Insufficient documentation

## 2021-03-22 DIAGNOSIS — F19929 Other psychoactive substance use, unspecified with intoxication, unspecified: Secondary | ICD-10-CM | POA: Diagnosis not present

## 2021-03-22 DIAGNOSIS — F4310 Post-traumatic stress disorder, unspecified: Secondary | ICD-10-CM | POA: Insufficient documentation

## 2021-03-22 DIAGNOSIS — Z1211 Encounter for screening for malignant neoplasm of colon: Secondary | ICD-10-CM

## 2021-03-22 DIAGNOSIS — Z9151 Personal history of suicidal behavior: Secondary | ICD-10-CM | POA: Diagnosis not present

## 2021-03-22 DIAGNOSIS — Z79899 Other long term (current) drug therapy: Secondary | ICD-10-CM | POA: Insufficient documentation

## 2021-03-22 DIAGNOSIS — F319 Bipolar disorder, unspecified: Secondary | ICD-10-CM

## 2021-03-22 DIAGNOSIS — F17200 Nicotine dependence, unspecified, uncomplicated: Secondary | ICD-10-CM

## 2021-03-22 MED ORDER — BREO ELLIPTA 100-25 MCG/INH IN AEPB: 1.0000 | INHALATION_SPRAY | Freq: Every day | RESPIRATORY_TRACT | 4 refills | Status: DC

## 2021-03-22 MED ORDER — NICOTINE POLACRILEX 4 MG MT LOZG: LOZENGE | OROMUCOSAL | 4 refills | Status: DC

## 2021-03-22 NOTE — Assessment & Plan Note (Signed)
  .   Current smoking consumption amount: 1 pack every 2 to 3 days  . Dicsussion on advise to quit smoking and smoking impacts: Cardiovascular lung impacts  . Patient's willingness to quit: Willing to quit  . Methods to quit smoking discussed: Behavioral modification  . Medication management of smoking session drugs discussed: on wellbutrin   . Resources provided:  AVS   . Setting quit date not established  . Follow-up arranged 1 month   Time spent counseling the patient: 5 minutes

## 2021-03-22 NOTE — Assessment & Plan Note (Signed)
Volume status is adequate blood pressure is good no change in heart medications made at this time refills given

## 2021-03-22 NOTE — Assessment & Plan Note (Signed)
Hypertension well-controlled no changes 

## 2021-03-22 NOTE — Assessment & Plan Note (Signed)
Care per mental health team ?

## 2021-03-22 NOTE — Patient Instructions (Signed)
Begin nicotine lozenge 4 mg 3 times daily to stop smoking  Begin Breo inhaler 1 puff daily for your COPD  All your other medications are unchanged and you have refills at your [facility] on all your other medications  Check your blood pressure if you become lightheaded again in the future let us know the result  A Cologuard kit will be mailed to your home please process this make sure you take it to [employer] from shipment back to the company  Return to see Dr. Joya Gaskins 2 months

## 2021-03-22 NOTE — Assessment & Plan Note (Signed)
Per electrophysiology

## 2021-03-22 NOTE — Assessment & Plan Note (Signed)
COPD we will give a trial of Breo 1 puff daily

## 2021-03-22 NOTE — Progress Notes (Signed)
Subjective:    Patient ID: Ryan Long, male    DOB: 1961/01/26, 60 y.o.   MRN: [id number] 60 y.o.M here to est PCP  Hx HTN, CHF, NICM, AICD in place,  allergic rhinitis, COPD, subst use mood d/o, ETOH use, HIV, Cocaine use, MDD, PTSD, Bipolar, prior suicide attempts,   01/06/2021 Patient is here to establish primary care by way of a telephone visit.  He fell yesterday on the ice and his leg is too sore to come in for a visit Patient notes he is having left-sided hip pain since he fell on the ice but has not sought attention as of yet.  He is able to stand and pivot on the leg.  Patient moved from [place] to [place] in August 2021 and he is now seeking primary care.  He has a prior history of ethanol abuse but he states he now only drinks about 1 beer a month.  Also history of tobacco use about three quarters a pack a day of cigarettes are being utilized.  The patient has history of nonischemic cardiomyopathy systolic with an AICD in place.  He is now being followed by local [facility] cardiology.  History of PTSD bipolar major depressive disorder.  He has had prior suicide attempts but he is not suicidal at this time.  This patient has history of allergic rhinitis COPD cocaine use HIV and hypertension.  He does not have diabetes.  Patient does have recurrent outbreaks of herpes viral infection in the penis area and is on chronic Valtrex suppression.  He is on Biktarvy for HIV and is followed by our [facility] has upcoming appointments.  He is followed in the heart vascular advanced heart failure clinic and has follow-up appointments in March noted.  He has an echocardiogram pending in March.  Last echocardiogram showed an EF of 20%.  Patient CD4 counts are normal and he is on his Biktarvy and is compliant with therapy.  The patient is needing refills on all his medications and has no other specific complaints at this time.  03/22/2021 Patient is seen in follow-up for chronic systolic  heart failure, HIV, BPH, PTSD, tobacco use, major depression, bipolar disorder.  Since the last visit the patient is done very well he has been compliant with his HIV medications he did go to the emergency room on April 1 they gave him doxycycline for presumed STD he states he is improving on this.  He has not had any firing of his AICD he still smokes three quarters a pack a day of cigarettes no other complaints at this visit On arrival his blood pressure is good at 123/71  Past Medical History:  Diagnosis Date  . Active smoker   . AICD (automatic cardioverter/defibrillator) present   . Alcohol abuse   . Anxiety   . AR (allergic rhinitis)   . Bipolar 1 disorder (HCC)   . CHF (congestive heart failure) (HCC)   . Chronic bronchitis (HCC)   . Chronic systolic heart failure (HCC)   . Controlled substance agreement signed 10/22/2018  . COPD (chronic obstructive pulmonary disease) (HCC)   . Cough 12/31/2018  . Crack cocaine use   . Depression   . Genital herpes   . HIV (human immunodeficiency virus infection) (HCC) dx'd 08/1993  . HLD (hyperlipidemia)   . Hypertension   . NICM (nonischemic cardiomyopathy) (HCC)    Echocardiogram 06/28/11: EF 30-35%, mild MR, mild LAE;  No CAD by coronary CT angiogram 3/12 at  [facility]  . NSVT (nonsustained ventricular tachycardia) (HCC)   . PTSD (post-traumatic stress disorder)      Family History  Problem Relation Age of Onset  . Glaucoma Mother   . Mental illness Mother   . Vision loss Mother   . Hypertension Father   . CAD Father   . Mental illness Father   . Alcohol abuse Father   . Drug abuse Father   . Heart attack Father 78  . Early death Father   . Alcohol abuse Brother   . Drug abuse Brother   . Drug abuse Brother      Social History   Socioeconomic History  . Marital status: Single    Spouse name: Not on file  . Number of children: Not on file  . Years of education: Not on file  . Highest education level: Not  on file  Occupational History  . Not on file  Tobacco Use  . Smoking status: Current Every Day Smoker    Packs/day: 0.75    Years: 38.00    Pack years: 28.50    Types: Cigarettes  . Smokeless tobacco: Never Used  . Tobacco comment: hoping welbutrin will help  Vaping Use  . Vaping Use: Never used  Substance and Sexual Activity  . Alcohol use: Not Currently    Alcohol/week: 6.0 standard drinks    Types: 6 Cans of beer per week    Comment: 01/08/2018 "stopped 06/2017"  . Drug use: Not Currently    Types: Cocaine    Comment: 01/08/2018 "stopped 06/2017"  . Sexual activity: Yes    Birth control/protection: Condom    Comment: pt. declined condoms  Other Topics Concern  . Not on file  Social History Narrative  . Not on file   Social Determinants of Health   Financial Resource Strain: Not on file  Food Insecurity: Not on file  Transportation Needs: Not on file  Physical Activity: Not on file  Stress: Not on file  Social Connections: Not on file  Intimate Partner Violence: Not on file     No Known Allergies   Outpatient Medications Prior to Visit  Medication Sig Dispense Refill  . aspirin EC 81 MG tablet Take 81 mg by mouth daily. Swallow whole.    . bictegravir-emtricitabine-tenofovir AF (BIKTARVY) 50-200-25 MG TABS tablet Take 1 tablet by mouth daily. 30 tablet 11  . doxycycline (VIBRAMYCIN) 100 MG capsule Take 1 capsule (100 mg total) by mouth 2 (two) times daily for 7 days. 14 capsule 0  . eplerenone (INSPRA) 25 MG tablet TAKE 1/2 TABLET BY MOUTH DAILY. 15 tablet 11  . hydrOXYzine (ATARAX/VISTARIL) 25 MG tablet TAKE 1-2 TABLETS BY MOUTH 2 TIMES DAILY AS NEEDED FOR ANXIETY. 60 tablet 3  . metoprolol succinate (TOPROL-XL) 25 MG 24 hr tablet Take 1 tablet (25 mg total) by mouth daily. For high blood pressure 90 tablet 2  . Multiple Vitamins-Minerals (MENS MULTIPLUS PO) Take by mouth.    . sacubitril-valsartan (ENTRESTO) 24-26 MG Take 1 tablet by mouth 2 (two) times daily. 90  tablet 0  . tamsulosin (FLOMAX) 0.4 MG CAPS capsule Take 2 capsules (0.8 mg total) by mouth daily after supper. For Prostate health 60 capsule 3  . traZODone (DESYREL) 100 MG tablet Take 1 tablet (100 mg total) by mouth at bedtime as needed for sleep. 30 tablet 3  . valACYclovir (VALTREX) 500 MG tablet TAKE 1 TABLET BY MOUTH EVERY DAY FOR HERPES 90 tablet 0  . albuterol (  VENTOLIN HFA) 108 (90 Base) MCG/ACT inhaler Inhale 1-2 puffs into the lungs every 6 (six) hours as needed for wheezing or shortness of breath. 1 g 0   No facility-administered medications prior to visit.      Review of Systems  Constitutional: Negative.   HENT: Negative.   Eyes: Negative.   Respiratory: Negative.   Cardiovascular: Negative.   Gastrointestinal: Negative.   Endocrine: Negative.   Musculoskeletal: Negative.   Neurological: Negative.   Psychiatric/Behavioral: Negative.        Objective:   Physical Exam    Vitals:   03/22/21 1103  BP: 123/71  Pulse: 96  Resp: 20  SpO2: 96%  Weight: 205 lb 12.8 oz (93.4 kg)  Height: 5\' 9"  (1.753 m)    Gen: Pleasant, well-nourished, in no distress,  normal affect  ENT: No lesions,  mouth clear,  oropharynx clear, no postnasal drip  Neck: No JVD, no TMG, no carotid bruits  Lungs: No use of accessory muscles, no dullness to percussion, clear without rales or rhonchi  Cardiovascular: RRR, heart sounds normal, no murmur or gallops, no peripheral edema  Abdomen: soft and NT, no HSM,  BS normal  Musculoskeletal: No deformities, no cyanosis or clubbing  Neuro: alert, non focal  Skin: Warm, no lesions or rashes   Assessment & Plan:  I personally reviewed all images and lab data in the [facility] system as well as any outside material available during this office visit and agree with the  radiology impressions.   Chronic systolic heart failure (HCC) Volume status is adequate blood pressure is good no change in heart medications made at this time refills  given  Hypertension Hypertension well controlled no changes  Chronic obstructive pulmonary disease (HCC) COPD we will give a trial of Breo 1 puff daily  Tobacco use disorder    . Current smoking consumption amount: 1 pack every 2 to 3 days  . Dicsussion on advise to quit smoking and smoking impacts: Cardiovascular lung impacts  . Patient's willingness to quit: Willing to quit  . Methods to quit smoking discussed: Behavioral modification  . Medication management of smoking session drugs discussed: on wellbutrin   . Resources provided:  AVS   . Setting quit date not established  . Follow-up arranged 1 month   Time spent counseling the patient: 5 minutes    Schizotypal disorder [facility]) Care per mental health team  Bipolar 1 disorder [facility]) Per mental health  AICD (automatic cardioverter/defibrillator) present Per electrophysiology   Ryan Long was seen today for medication refill.  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 -     nicotine polacrilex (NICORETTE MINI) 4 MG lozenge; Take one three times daily to stop smoking  Will give Cologuard for colon cancer screening

## 2021-03-22 NOTE — Assessment & Plan Note (Signed)
Per mental health 

## 2021-03-25 ENCOUNTER — Encounter: Payer: Medicare Other | Admitting: Infectious Disease

## 2021-03-26 ENCOUNTER — Other Ambulatory Visit: Payer: Self-pay | Admitting: Critical Care Medicine

## 2021-03-26 NOTE — Telephone Encounter (Signed)
Requested medication (s) are due for refill today: yes  Requested medication (s) are on the active medication list: yes  Last refill:  01/05/21  Future visit scheduled: yes  Notes to clinic:  med not assigned to a protocol   Requested Prescriptions  Pending Prescriptions Disp Refills   ENTRESTO 24-26 MG [Pharmacy Med Name: ENTRESTO 24-26MG  TABLETS] 30 tablet     Sig: TAKE 1 TABLET BY MOUTH TWICE DAILY      Off-Protocol Failed - 03/26/2021  3:03 PM      Failed - Medication not assigned to a protocol, review manually.      Passed - Valid encounter within last 12 months    Recent Outpatient Visits           4 days ago Mucopurulent chronic bronchitis (HCC)   [facility] Storm Frisk, MD   2 months ago Chronic systolic heart failure [facility])   [facility] Storm Frisk, MD   1 year ago    [facility]   2 years ago Chronic pain of left ankle   [facility] [place], [place]   3 years ago NICM (nonischemic cardiomyopathy) [facility])   [facility] Hoy Register, MD       Future Appointments             In 1 month Delford Field Charlcie Cradle, MD [facility]

## 2021-03-29 ENCOUNTER — Other Ambulatory Visit: Payer: Self-pay | Admitting: Critical Care Medicine

## 2021-03-29 NOTE — Telephone Encounter (Signed)
Called patient to review medication requested by pharmacy to have patient to call cardiologist for refill. No answer. Left message on voicemail to call clinic at #[phone] to call cardiologist for refill. Marland Kitchen

## 2021-03-30 ENCOUNTER — Other Ambulatory Visit (HOSPITAL_COMMUNITY): Payer: Self-pay | Admitting: *Deleted

## 2021-03-30 MED ORDER — ENTRESTO 24-26 MG PO TABS: 1.0000 | ORAL_TABLET | Freq: Two times a day (BID) | ORAL | 0 refills | Status: DC

## 2021-04-07 LAB — COLOGUARD: Cologuard: NEGATIVE

## 2021-04-08 ENCOUNTER — Other Ambulatory Visit: Payer: Self-pay | Admitting: Infectious Disease

## 2021-04-08 DIAGNOSIS — B20 Human immunodeficiency virus [HIV] disease: Secondary | ICD-10-CM

## 2021-04-12 ENCOUNTER — Ambulatory Visit: Payer: Medicare Other | Admitting: Internal Medicine

## 2021-04-13 ENCOUNTER — Ambulatory Visit: Payer: Medicare Other | Admitting: Internal Medicine

## 2021-04-19 NOTE — Progress Notes (Signed)
Advanced Heart Failure Clinic Note   Patient ID: Ryan Long, male   DOB: Jan 10, 1961, 60 y.o.   MRN: [id number] ID: Dr Daiva Eves PCP: Dr Armen Pickup EP : Dr Elberta Fortis  HF: Dr Launa Grill Ryan Long is a 60 y.o. male with HIV, HTN, major depressive disorder, chronic systolic heart failure (nonischemic cardiomyopathy), and polysubstance abuse.  Coronary CT angiogram in 3/12 demonstrated no CAD.  Cardiolite in 2015 showed no ischemia or infarction.  NICM was not felt to be related to HIV given his good CD4 counts.   He is S/P St Jude ICD 12/2017  ECHO 09/2017 EF 25-30%   He was evaluated [facility] 12/25/17 with suicidal ideation. He was placed on IVC with psych consulted. He was later cleared and discharged.   Seen in HF Clinic 10/21 for first time since 2019. Here for f/u. Says he feels ok. Can get around and do his activities as long as he takes his time. Says he gets around pretty good. Gets dizzy if he bends over. Taking all meds.   Echo today 04/20/21 EF 20-25% mild MR. RV ok Personally reviewed    Studies:  ECHO 06/2013 EF 35-40% ECHO 9/16 with EF 30-35%, diffuse hypokinesis, grade I diastolic dysfunction    Past Medical History:  Diagnosis Date  . Active smoker   . AICD (automatic cardioverter/defibrillator) present   . Alcohol abuse   . Anxiety   . AR (allergic rhinitis)   . Bipolar 1 disorder (HCC)   . CHF (congestive heart failure) (HCC)   . Chronic bronchitis (HCC)   . Chronic systolic heart failure (HCC)   . Controlled substance agreement signed 10/22/2018  . COPD (chronic obstructive pulmonary disease) (HCC)   . Cough 12/31/2018  . Crack cocaine use   . Depression   . Genital herpes   . HIV (human immunodeficiency virus infection) (HCC) dx'd 08/1993  . HLD (hyperlipidemia)   . Hypertension   . NICM (nonischemic cardiomyopathy) (HCC)    Echocardiogram 06/28/11: EF 30-35%, mild MR, mild LAE;  No CAD by coronary CT angi[facility] 3/12 [facility]   . NSVT (nonsustained ventricular tachycardia) (HCC)   . PTSD (post-traumatic stress disorder)     Current Outpatient Medications  Medication Sig Dispense Refill  . aspirin EC 81 MG tablet Take 81 mg by mouth daily. Swallow whole.    Marland Kitchen BIKTARVY 50-200-25 MG TABS tablet TAKE 1 TABLET BY MOUTH EVERY DAY 30 tablet 0  . eplerenone (INSPRA) 25 MG tablet TAKE 1/2 TABLET BY MOUTH DAILY. 15 tablet 11  . fluticasone furoate-vilanterol (BREO ELLIPTA) 100-25 MCG/INH AEPB Inhale 1 puff into the lungs daily. 60 each 4  . hydrOXYzine (ATARAX/VISTARIL) 25 MG tablet TAKE 1-2 TABLETS BY MOUTH 2 TIMES DAILY AS NEEDED FOR ANXIETY. 60 tablet 3  . metoprolol succinate (TOPROL-XL) 25 MG 24 hr tablet Take 1 tablet (25 mg total) by mouth daily. For high blood pressure 90 tablet 2  . Multiple Vitamins-Minerals (MENS MULTIPLUS PO) Take by mouth.    . nicotine polacrilex (NICORETTE MINI) 4 MG lozenge Take one three times daily to stop smoking 100 tablet 4  . sacubitril-valsartan (ENTRESTO) 24-26 MG Take 1 tablet by mouth 2 (two) times daily. 60 tablet 0  . tamsulosin (FLOMAX) 0.4 MG CAPS capsule Take 2 capsules (0.8 mg total) by mouth daily after supper. For Prostate health 60 capsule 3  . traZODone (DESYREL) 100 MG tablet Take 1 tablet (100 mg total) by mouth at bedtime  as needed for sleep. 30 tablet 3  . valACYclovir (VALTREX) 500 MG tablet TAKE 1 TABLET BY MOUTH EVERY DAY FOR HERPES 90 tablet 0   No current facility-administered medications for this encounter.    Allergies: No Known Allergies   Review of systems complete and found to be negative unless listed in HPI.    Vital Signs:  Vitals:   04/20/21 0950  BP: 118/80  Pulse: 85  SpO2: 96%  Weight: 96.3 kg (212 lb 6.4 oz)    Wt Readings from Last 3 Encounters:  04/20/21 96.3 kg (212 lb 6.4 oz)  03/22/21 93.4 kg (205 lb 12.8 oz)  11/15/20 90.7 kg (200 lb)    PHYSICAL EXAM: General:  Well appearing. No resp difficulty HEENT: normal Neck: supple.  no JVD. Carotids 2+ bilat; no bruits. No thryomegaly appreciated. Swollen L cervical LN Cor: PMI nondisplaced. Regular rate & rhythm. No rubs, gallops or murmurs. Lungs: clear Abdomen: soft, nontender, nondistended. No hepatosplenomegaly. No bruits or masses. Good bowel sounds. Extremities: no cyanosis, clubbing, rash, edema Neuro: alert & orientedx3, cranial nerves grossly intact. moves all 4 extremities w/o difficulty. Affect pleasant   ASSESSMENT AND PLAN:   1. Chronic Systolic Heart Failure: Nonischemic cardiomyopathy.   - Echo 09/2017 EF 20-25%.  - Echo today 04/20/21 EF 20-25% mild MR. RV ok Personally reviewed - S/P St Jude ICD  - Stable NYHA II  Volume status stable off lasix   - Increase Entresto to 49/51 - need to watch closely given occasional dizziness with bending over - Continue toprol xl  25 mg daily. - Continue inspra 12.5 mg daily - No SGLT2i yet with dizziness - Overall stable but given persistently reduced EF will get CPX test to objectively quantify exercise capacity 2. HIV:  - He has follow up with Dr Daiva Eves.   - Has swollen left cervical LN. I have asked him to have Dr. Daiva Eves look at this when he sees him later today.  3. Tobacco use disorder:   - Back to smoking about 3/4 ppd. We discussed use of nicotine lozenges or patches 4. Former Cocaine - Still using on occasion  - Encouraged to quit   Arvilla Meres, MD  04/19/2021

## 2021-04-20 ENCOUNTER — Ambulatory Visit (HOSPITAL_COMMUNITY)
Admission: RE | Admit: 2021-04-20 | Discharge: 2021-04-20 | Disposition: A | Discharge status: Home / Self Care | Payer: Medicare Other | Source: Ambulatory Visit | Attending: Internal Medicine | Admitting: Internal Medicine

## 2021-04-20 ENCOUNTER — Telehealth: Payer: Self-pay | Admitting: Critical Care Medicine

## 2021-04-20 ENCOUNTER — Other Ambulatory Visit: Payer: Self-pay

## 2021-04-20 ENCOUNTER — Encounter (HOSPITAL_COMMUNITY): Payer: Self-pay | Admitting: Internal Medicine

## 2021-04-20 ENCOUNTER — Ambulatory Visit (INDEPENDENT_AMBULATORY_CARE_PROVIDER_SITE_OTHER): Payer: Medicare Other | Admitting: Infectious Disease

## 2021-04-20 ENCOUNTER — Encounter: Payer: Self-pay | Admitting: Infectious Disease

## 2021-04-20 ENCOUNTER — Ambulatory Visit (HOSPITAL_BASED_OUTPATIENT_CLINIC_OR_DEPARTMENT_OTHER)
Admission: RE | Admit: 2021-04-20 | Discharge: 2021-04-20 | Disposition: A | Discharge status: Home / Self Care | Payer: Medicare Other | Source: Ambulatory Visit | Attending: Internal Medicine | Admitting: Internal Medicine

## 2021-04-20 VITALS — BP 118/80 | HR 85 | Wt 212.4 lb

## 2021-04-20 VITALS — BP 116/78 | HR 90 | Wt 211.6 lb

## 2021-04-20 DIAGNOSIS — I5022 Chronic systolic (congestive) heart failure: Secondary | ICD-10-CM

## 2021-04-20 DIAGNOSIS — I428 Other cardiomyopathies: Secondary | ICD-10-CM | POA: Diagnosis not present

## 2021-04-20 DIAGNOSIS — I889 Nonspecific lymphadenitis, unspecified: Secondary | ICD-10-CM | POA: Insufficient documentation

## 2021-04-20 DIAGNOSIS — I110 Hypertensive heart disease with heart failure: Secondary | ICD-10-CM | POA: Insufficient documentation

## 2021-04-20 DIAGNOSIS — B20 Human immunodeficiency virus [HIV] disease: Secondary | ICD-10-CM

## 2021-04-20 DIAGNOSIS — J449 Chronic obstructive pulmonary disease, unspecified: Secondary | ICD-10-CM | POA: Insufficient documentation

## 2021-04-20 DIAGNOSIS — Z95810 Presence of automatic (implantable) cardiac defibrillator: Secondary | ICD-10-CM | POA: Diagnosis not present

## 2021-04-20 DIAGNOSIS — F17200 Nicotine dependence, unspecified, uncomplicated: Secondary | ICD-10-CM

## 2021-04-20 DIAGNOSIS — I472 Ventricular tachycardia, unspecified: Secondary | ICD-10-CM

## 2021-04-20 DIAGNOSIS — I4891 Unspecified atrial fibrillation: Secondary | ICD-10-CM | POA: Diagnosis not present

## 2021-04-20 DIAGNOSIS — I159 Secondary hypertension, unspecified: Secondary | ICD-10-CM

## 2021-04-20 DIAGNOSIS — E785 Hyperlipidemia, unspecified: Secondary | ICD-10-CM | POA: Insufficient documentation

## 2021-04-20 DIAGNOSIS — B009 Herpesviral infection, unspecified: Secondary | ICD-10-CM | POA: Diagnosis not present

## 2021-04-20 DIAGNOSIS — R300 Dysuria: Secondary | ICD-10-CM | POA: Diagnosis not present

## 2021-04-20 DIAGNOSIS — Z720 Tobacco use: Secondary | ICD-10-CM | POA: Diagnosis not present

## 2021-04-20 DIAGNOSIS — Z7289 Other problems related to lifestyle: Secondary | ICD-10-CM | POA: Insufficient documentation

## 2021-04-20 DIAGNOSIS — F1490 Cocaine use, unspecified, uncomplicated: Secondary | ICD-10-CM | POA: Diagnosis not present

## 2021-04-20 HISTORY — DX: Nonspecific lymphadenitis, unspecified: I88.9

## 2021-04-20 LAB — ECHOCARDIOGRAM COMPLETE
Calc EF: 27.3 %
MV M vel: 4.53 m/s
MV Peak grad: 82.1 mmHg
S' Lateral: 4.7 cm
Single Plane A2C EF: 32.7 %
Single Plane A4C EF: 20.8 %

## 2021-04-20 MED ORDER — ENTRESTO 49-51 MG PO TABS: 1.0000 | ORAL_TABLET | Freq: Two times a day (BID) | ORAL | 11 refills | Status: DC

## 2021-04-20 MED ORDER — BIKTARVY 50-200-25 MG PO TABS: 1.0000 | ORAL_TABLET | Freq: Every day | ORAL | 0 refills | Status: DC

## 2021-04-20 NOTE — Patient Instructions (Addendum)
No Labs done today.   INCREASE Entresto to 49-51mg  (1 tablet) by mouth 2 times daily.  No other medication changes were made. Please continue all current medications as prescribed.  Your physician has recommended that you have a cardiopulmonary stress test (CPX). CPX testing is a non-invasive measurement of heart and lung function. It replaces a traditional treadmill stress test. This type of test provides a tremendous amount of information that relates not only to your present condition but also for future outcomes. This test combines measurements of you ventilation, respiratory gas exchange in the lungs, electrocardiogram (EKG), blood pressure and physical response before, during, and following an exercise protocol. Please refer to handout given to you during your appointment today for pre CPX instructions.    Your physician recommends that you schedule a follow-up appointment in: 3 months  If you have any questions or concerns before your next appointment please send [place] a message through [place] or call our office at [phone].    TO LEAVE A MESSAGE FOR THE NURSE SELECT OPTION 2, PLEASE LEAVE A MESSAGE INCLUDING: . YOUR NAME . DATE OF BIRTH . CALL BACK NUMBER . REASON FOR CALL**this is important as we prioritize the call backs  YOU WILL RECEIVE A CALL BACK THE SAME DAY AS LONG AS YOU CALL BEFORE 4:00 PM   Do the following things EVERYDAY: 1) Weigh yourself in the morning before breakfast. Write it down and keep it in a log. 2) Take your medicines as prescribed 3) Eat low salt foods--Limit salt (sodium) to 2000 mg per day.  4) Stay as active as you can everyday 5) Limit all fluids for the day to less than 2 liters   At the Advanced Heart Failure Clinic, you and your health needs are our priority. As part of our continuing mission to provide you with exceptional heart care, we have created designated Provider Care Teams. These Care Teams include your primary Cardiologist (physician)  and Advanced Practice Providers (APPs- Physician Assistants and Nurse Practitioners) who all work together to provide you with the care you need, when you need it.   You may see any of the following providers on your designated Care Team at your next follow up: Marland Kitchen Dr Arvilla Meres . Dr Marca Ancona . [name], NP . Robbie Lis, PA . Karle Plumber, PharmD   Please be sure to bring in all your medications bottles to every appointment.

## 2021-04-20 NOTE — Progress Notes (Signed)
Subjective:  Chief complaint cervical lymphadenopathy  Patient ID: Ryan Long, male    DOB: 05-22-1961, 59 y.o.   MRN: [id number]  HPI  60 year old man with HIV, host of other medical problems including nonischemic cardiomyopathy, ICD placement prior history of polydrug substance abuse.   He recently visited the emergency department with problems with dysuria and hematuria was checked for [facility] and chlamydia and also had a urine analysis performed.  Actually only showed 0-5 white cells.  He says he was prescribed antibiotics and felt better.  He has in the last 4 days developed left-sided cervical lymphadenopathy is quite obvious on exam with pain with movement of his neck to the left with palpation or when he coughs.  He does not have a sore throat he does not have a clear dental infection he has not been scratched on his arm by a cat he did burn himself roughly a month ago on his hand.     Past Medical History:  Diagnosis Date  . Active smoker   . AICD (automatic cardioverter/defibrillator) present   . Alcohol abuse   . Anxiety   . AR (allergic rhinitis)   . Bipolar 1 disorder (HCC)   . CHF (congestive heart failure) (HCC)   . Chronic bronchitis (HCC)   . Chronic systolic heart failure (HCC)   . Controlled substance agreement signed 10/22/2018  . COPD (chronic obstructive pulmonary disease) (HCC)   . Cough 12/31/2018  . Crack cocaine use   . Depression   . Genital herpes   . HIV (human immunodeficiency virus infection) (HCC) dx'd 08/1993  . HLD (hyperlipidemia)   . Hypertension   . NICM (nonischemic cardiomyopathy) (HCC)    Echocardiogram 06/28/11: EF 30-35%, mild MR, mild LAE;  No CAD by coronary CT angiogram 3/12 at [facility]  . NSVT (nonsustained ventricular tachycardia) (HCC)   . PTSD (post-traumatic stress disorder)     Past Surgical History:  Procedure Laterality Date  . CARDIAC DEFIBRILLATOR PLACEMENT  01/08/2018  . ICD IMPLANT N/A  01/08/2018   Procedure: ICD IMPLANT;  Surgeon: Regan Lemming, MD;  Location: [facility] INVASIVE CV LAB;  Service: Cardiovascular;  Laterality: N/A;    Family History  Problem Relation Age of Onset  . Glaucoma Mother   . Mental illness Mother   . Vision loss Mother   . Hypertension Father   . CAD Father   . Mental illness Father   . Alcohol abuse Father   . Drug abuse Father   . Heart attack Father 63  . Early death Father   . Alcohol abuse Brother   . Drug abuse Brother   . Drug abuse Brother       Social History   Socioeconomic History  . Marital status: Single    Spouse name: Not on file  . Number of children: Not on file  . Years of education: Not on file  . Highest education level: Not on file  Occupational History  . Not on file  Tobacco Use  . Smoking status: Current Every Day Smoker    Packs/day: 0.75    Years: 38.00    Pack years: 28.50    Types: Cigarettes  . Smokeless tobacco: Never Used  . Tobacco comment: hoping welbutrin will help  Vaping Use  . Vaping Use: Never used  Substance and Sexual Activity  . Alcohol use: Not Currently    Alcohol/week: 6.0 standard drinks    Types: 6 Cans of beer per  week    Comment: 01/08/2018 "stopped 06/2017"  . Drug use: Not Currently    Types: Cocaine    Comment: 01/08/2018 "stopped 06/2017"  . Sexual activity: Yes    Birth control/protection: Condom    Comment: pt. declined condoms  Other Topics Concern  . Not on file  Social History Narrative  . Not on file   Social Determinants of Health   Financial Resource Strain: Not on file  Food Insecurity: Not on file  Transportation Needs: Not on file  Physical Activity: Not on file  Stress: Not on file  Social Connections: Not on file    No Known Allergies   Current Outpatient Medications:  .  BIKTARVY 50-200-25 MG TABS tablet, TAKE 1 TABLET BY MOUTH EVERY DAY, Disp: 30 tablet, Rfl: 0 .  aspirin EC 81 MG tablet, Take 81 mg by mouth daily. Swallow whole., Disp: ,  Rfl:  .  eplerenone (INSPRA) 25 MG tablet, TAKE 1/2 TABLET BY MOUTH DAILY., Disp: 15 tablet, Rfl: 11 .  fluticasone furoate-vilanterol (BREO ELLIPTA) 100-25 MCG/INH AEPB, Inhale 1 puff into the lungs daily., Disp: 60 each, Rfl: 4 .  hydrOXYzine (ATARAX/VISTARIL) 25 MG tablet, TAKE 1-2 TABLETS BY MOUTH 2 TIMES DAILY AS NEEDED FOR ANXIETY., Disp: 60 tablet, Rfl: 3 .  Melatonin 10 MG CAPS, Take by mouth at bedtime., Disp: , Rfl:  .  metoprolol succinate (TOPROL-XL) 25 MG 24 hr tablet, Take 1 tablet (25 mg total) by mouth daily. For high blood pressure, Disp: 90 tablet, Rfl: 2 .  Multiple Vitamins-Minerals (MENS MULTIPLUS PO), Take by mouth., Disp: , Rfl:  .  nicotine polacrilex (NICORETTE MINI) 4 MG lozenge, Take one three times daily to stop smoking, Disp: 100 tablet, Rfl: 4 .  sacubitril-valsartan (ENTRESTO) 49-51 MG, Take 1 tablet by mouth 2 (two) times daily., Disp: 60 tablet, Rfl: 11 .  tamsulosin (FLOMAX) 0.4 MG CAPS capsule, Take 2 capsules (0.8 mg total) by mouth daily after supper. For Prostate health, Disp: 60 capsule, Rfl: 3 .  traZODone (DESYREL) 100 MG tablet, Take 1 tablet (100 mg total) by mouth at bedtime as needed for sleep., Disp: 30 tablet, Rfl: 3 .  valACYclovir (VALTREX) 500 MG tablet, TAKE 1 TABLET BY MOUTH EVERY DAY FOR HERPES, Disp: 90 tablet, Rfl: 0   Review of Systems  Constitutional: Negative for activity change, appetite change, chills, diaphoresis, fever and unexpected weight change.  HENT: Negative for congestion, rhinorrhea, sinus pressure, sneezing, sore throat and trouble swallowing.   Eyes: Negative for photophobia and visual disturbance.  Respiratory: Negative for cough, chest tightness, shortness of breath, wheezing and stridor.   Cardiovascular: Negative for chest pain, palpitations and leg swelling.  Gastrointestinal: Negative for abdominal distention, abdominal pain, anal bleeding, blood in stool, constipation, diarrhea, nausea and vomiting.  Genitourinary:  Negative for difficulty urinating, dysuria, flank pain and hematuria.  Musculoskeletal: Negative for arthralgias, back pain, gait problem, joint swelling and myalgias.  Skin: Negative for color change, pallor, rash and wound.  Neurological: Negative for dizziness, tremors, weakness and light-headedness.  Hematological: Positive for adenopathy. Does not bruise/bleed easily.  Psychiatric/Behavioral: Negative for agitation, behavioral problems, confusion, decreased concentration and sleep disturbance.       Objective:   Physical Exam Constitutional:      General: He is not in acute distress.    Appearance: Normal appearance. He is well-developed. He is not ill-appearing or diaphoretic.  HENT:     Head: Normocephalic and atraumatic.     Right Ear: Hearing and  external ear normal.     Left Ear: Hearing and external ear normal.     Nose: No nasal deformity or rhinorrhea.     Mouth/Throat:     Dentition: Dental caries present.  Eyes:     General: No scleral icterus.    Conjunctiva/sclera: Conjunctivae normal.     Right eye: Right conjunctiva is not injected.     Left eye: Left conjunctiva is not injected.     Pupils: Pupils are equal, round, and reactive to light.  Neck:     Vascular: No JVD.  Cardiovascular:     Rate and Rhythm: Normal rate and regular rhythm.     Heart sounds: Normal heart sounds, S1 normal and S2 normal. No murmur heard. No friction rub.  Abdominal:     General: Bowel sounds are normal. There is no distension.     Palpations: Abdomen is soft.     Tenderness: There is no abdominal tenderness.  Musculoskeletal:        General: Normal range of motion.     Right shoulder: Normal.     Left shoulder: Normal.     Cervical back: Normal range of motion and neck supple.     Right hip: Normal.     Left hip: Normal.     Right knee: Normal.     Left knee: Normal.  Lymphadenopathy:     Head:     Right side of head: No submandibular, preauricular or posterior auricular  adenopathy.     Left side of head: Submandibular adenopathy present. No preauricular or posterior auricular adenopathy.     Cervical: Cervical adenopathy present.     Right cervical: No superficial or deep cervical adenopathy.    Left cervical: Superficial cervical adenopathy and deep cervical adenopathy present.  Skin:    General: Skin is warm and dry.     Coloration: Skin is not pale.     Findings: No abrasion, bruising, ecchymosis, erythema, lesion or rash.     Nails: There is no clubbing.  Neurological:     Mental Status: He is alert and oriented to person, place, and time.     Sensory: No sensory deficit.     Coordination: Coordination normal.     Gait: Gait normal.  Psychiatric:        Attention and Perception: He is attentive.        Mood and Affect: Mood normal.        Speech: Speech normal.        Behavior: Behavior normal. Behavior is cooperative.        Thought Content: Thought content normal.        Judgment: Judgment normal.           Assessment & Plan:    Cervical lymphadenopathy: We will check CBC CMP HIV viral load LDH EBV and CMV titers.  Would be good for him to get a COVID-19 test as well.  I will schedule him in a few weeks time to make sure that these lymph nodes are resolving and if they are not we will pursue CT of the neck with contrast  If the disease continue BIKTARVY is had excellent adherence over the years.   Dysuria and hematuria: This is resolved  Nonischemic cardiomyopathy and ICD: Follow-up cardiology   Smoking he continues to smoke unfortunately have counseled him to stop smoking    I spent greater than 40 minutes with the patient including greater than 50% of time in face to  face counsel of the patient during the problems listed above and in coordination of his care.

## 2021-04-20 NOTE — Addendum Note (Signed)
Encounter addended by: Chinita Pester, CMA on: 04/20/2021 10:15 AM  Actions taken: Order list changed, Diagnosis association updated, Follow-up modified, Clinical Note Signed

## 2021-04-20 NOTE — Telephone Encounter (Signed)
Please let the pt know his cologard test was NEG for cancer recheck in 3 years

## 2021-04-20 NOTE — Telephone Encounter (Signed)
Pt informed cologard test was negative for cancer and will recheck in 3 years. Pt verbalized understanding and voiced no other concerns.

## 2021-04-20 NOTE — Progress Notes (Signed)
  Echocardiogram 2D Echocardiogram has been performed.  Augustine Radar 04/20/2021, 9:40 AM

## 2021-04-21 ENCOUNTER — Encounter (HOSPITAL_COMMUNITY): Payer: Medicare Other

## 2021-04-22 ENCOUNTER — Telehealth: Payer: Self-pay | Admitting: Critical Care Medicine

## 2021-04-22 NOTE — Telephone Encounter (Signed)
Pt has been informed of 830 appointment on Monday

## 2021-04-22 NOTE — Telephone Encounter (Signed)
Pt with neck swelling, he needs OV with me added on, ok to double book on Monday AM next week

## 2021-04-24 LAB — CBC WITH DIFFERENTIAL/PLATELET
Absolute Monocytes: 312 cells/uL (ref 200–950)
Basophils Absolute: 30 cells/uL (ref 0–200)
Basophils Relative: 1 %
Eosinophils Absolute: 51 cells/uL (ref 15–500)
Eosinophils Relative: 1.7 %
HCT: 51.4 % — ABNORMAL HIGH (ref 38.5–50.0)
Hemoglobin: 17.6 g/dL — ABNORMAL HIGH (ref 13.2–17.1)
Lymphs Abs: 1506 cells/uL (ref 850–3900)
MCH: 33.5 pg — ABNORMAL HIGH (ref 27.0–33.0)
MCHC: 34.2 g/dL (ref 32.0–36.0)
MCV: 97.7 fL (ref 80.0–100.0)
MPV: 9.8 fL (ref 7.5–12.5)
Monocytes Relative: 10.4 %
Neutro Abs: 1101 cells/uL — ABNORMAL LOW (ref 1500–7800)
Neutrophils Relative %: 36.7 %
Platelets: 139 10*3/uL — ABNORMAL LOW (ref 140–400)
RBC: 5.26 10*6/uL (ref 4.20–5.80)
RDW: 14 % (ref 11.0–15.0)
Total Lymphocyte: 50.2 %
WBC: 3 10*3/uL — ABNORMAL LOW (ref 3.8–10.8)

## 2021-04-24 LAB — COMPLETE METABOLIC PANEL WITH GFR
AG Ratio: 1.9 (calc) (ref 1.0–2.5)
ALT: 19 U/L (ref 9–46)
AST: 25 U/L (ref 10–35)
Albumin: 3.8 g/dL (ref 3.6–5.1)
Alkaline phosphatase (APISO): 47 U/L (ref 35–144)
BUN: 12 mg/dL (ref 7–25)
CO2: 30 mmol/L (ref 20–32)
Calcium: 8.6 mg/dL (ref 8.6–10.3)
Chloride: 107 mmol/L (ref 98–110)
Creat: 1.17 mg/dL (ref 0.70–1.33)
GFR, Est African American: 79 mL/min/{1.73_m2} (ref >=60)
GFR, Est Non African American: 68 mL/min/{1.73_m2} (ref >=60)
Globulin: 2 g/dL (calc) (ref 1.9–3.7)
Glucose, Bld: 79 mg/dL (ref 65–99)
Potassium: 4 mmol/L (ref 3.5–5.3)
Sodium: 143 mmol/L (ref 135–146)
Total Bilirubin: 0.9 mg/dL (ref 0.2–1.2)
Total Protein: 5.8 g/dL — ABNORMAL LOW (ref 6.1–8.1)

## 2021-04-24 LAB — EPSTEIN-BARR VIRUS VCA ANTIBODY PANEL
EBV NA IgG: 57.9 U/mL — ABNORMAL HIGH
EBV VCA IgG: 343 U/mL — ABNORMAL HIGH
EBV VCA IgM: <36 U/mL

## 2021-04-24 LAB — HIV-1 RNA QUANT-NO REFLEX-BLD
HIV 1 RNA Quant: NOT DETECTED Copies/mL
HIV-1 RNA Quant, Log: NOT DETECTED Log cps/mL

## 2021-04-24 LAB — CYTOMEGALOVIRUS ANTIBODY, IGG: Cytomegalovirus Ab-IgG: >10 U/mL — ABNORMAL HIGH

## 2021-04-24 LAB — C-REACTIVE PROTEIN: CRP: 11.7 mg/L — ABNORMAL HIGH (ref <8.0)

## 2021-04-24 LAB — SEDIMENTATION RATE: Sed Rate: 2 mm/h (ref 0–20)

## 2021-04-24 NOTE — Progress Notes (Signed)
Subjective:    Patient ID: Ryan Long, male    DOB: 01/16/1961, 60 y.o.   MRN: [id number] 60 y.o.M here to est PCP  Hx HTN, CHF, NICM, AICD in place,  allergic rhinitis, COPD, subst use mood d/o, ETOH use, HIV, Cocaine use, MDD, PTSD, Bipolar, prior suicide attempts,   01/06/2021 Patient is here to establish primary care by way of a telephone visit.  He fell yesterday on the ice and his leg is too sore to come in for a visit Patient notes he is having left-sided hip pain since he fell on the ice but has not sought attention as of yet.  He is able to stand and pivot on the leg.  Patient moved from [place] to [place] in August 2021 and he is now seeking primary care.  He has a prior history of ethanol abuse but he states he now only drinks about 1 beer a month.  Also history of tobacco use about three quarters a pack a day of cigarettes are being utilized.  The patient has history of nonischemic cardiomyopathy systolic with an AICD in place.  He is now being followed by local [facility] cardiology.  History of PTSD bipolar major depressive disorder.  He has had prior suicide attempts but he is not suicidal at this time.  This patient has history of allergic rhinitis COPD cocaine use HIV and hypertension.  He does not have diabetes.  Patient does have recurrent outbreaks of herpes viral infection in the penis area and is on chronic Valtrex suppression.  He is on Biktarvy for HIV and is followed by our [facility] has upcoming appointments.  He is followed in the heart vascular advanced heart failure clinic and has follow-up appointments in March noted.  He has an echocardiogram pending in March.  Last echocardiogram showed an EF of 20%.  Patient CD4 counts are normal and he is on his Biktarvy and is compliant with therapy.  The patient is needing refills on all his medications and has no other specific complaints at this time.  03/22/2021 Patient is seen in follow-up for chronic systolic  heart failure, HIV, BPH, PTSD, tobacco use, major depression, bipolar disorder.  Since the last visit the patient is done very well he has been compliant with his HIV medications he did go to the emergency room on April 1 they gave him doxycycline for presumed STD he states he is improving on this.  He has not had any firing of his AICD he still smokes three quarters a pack a day of cigarettes no other complaints at this visit On arrival his blood pressure is good at 123/71  04/25/21 Since the last visit the patient is developed left neck swelling for 10 days.  This occurred after a lower tooth was being [place] into the tooth appeared to chip off.  Patient continues to smoke a pack of cigarettes every 2 to 3 days.  Note his Cologuard test came back negative  Patient's had adequate breathing and no difficulty with shortness of breath blood pressure on arrival is excellent 109/76 he maintains his HIV therapy.   Past Medical History:  Diagnosis Date  . Active smoker   . AICD (automatic cardioverter/defibrillator) present   . Alcohol abuse   . Anxiety   . AR (allergic rhinitis)   . Bipolar 1 disorder (HCC)   . Cervical lymphadenitis 04/20/2021  . CHF (congestive heart failure) (HCC)   . Chronic bronchitis (HCC)   . Chronic  systolic heart failure (HCC)   . Controlled substance agreement signed 10/22/2018  . COPD (chronic obstructive pulmonary disease) (HCC)   . Cough 12/31/2018  . Crack cocaine use   . Depression   . Genital herpes   . HIV (human immunodeficiency virus infection) (HCC) dx'd 08/1993  . HLD (hyperlipidemia)   . Hypertension   . NICM (nonischemic cardiomyopathy) (HCC)    Echocardiogram 06/28/11: EF 30-35%, mild MR, mild LAE;  No CAD by coronary CT angiogram 3/12 at [facility]  . NSVT (nonsustained ventricular tachycardia) (HCC)   . PTSD (post-traumatic stress disorder)      Family History  Problem Relation Age of Onset  . Glaucoma Mother   . Mental  illness Mother   . Vision loss Mother   . Hypertension Father   . CAD Father   . Mental illness Father   . Alcohol abuse Father   . Drug abuse Father   . Heart attack Father 47  . Early death Father   . Alcohol abuse Brother   . Drug abuse Brother   . Drug abuse Brother      Social History   Socioeconomic History  . Marital status: Single    Spouse name: Not on file  . Number of children: Not on file  . Years of education: Not on file  . Highest education level: Not on file  Occupational History  . Not on file  Tobacco Use  . Smoking status: Current Every Day Smoker    Packs/day: 0.75    Years: 38.00    Pack years: 28.50    Types: Cigarettes  . Smokeless tobacco: Never Used  . Tobacco comment: hoping welbutrin will help  Vaping Use  . Vaping Use: Never used  Substance and Sexual Activity  . Alcohol use: Not Currently    Alcohol/week: 6.0 standard drinks    Types: 6 Cans of beer per week    Comment: 01/08/2018 "stopped 06/2017"  . Drug use: Not Currently    Types: Cocaine    Comment: 01/08/2018 "stopped 06/2017"  . Sexual activity: Yes    Birth control/protection: Condom    Comment: pt. declined condoms  Other Topics Concern  . Not on file  Social History Narrative  . Not on file   Social Determinants of Health   Financial Resource Strain: Not on file  Food Insecurity: Not on file  Transportation Needs: Not on file  Physical Activity: Not on file  Stress: Not on file  Social Connections: Not on file  Intimate Partner Violence: Not on file     No Known Allergies   Outpatient Medications Prior to Visit  Medication Sig Dispense Refill  . aspirin EC 81 MG tablet Take 81 mg by mouth daily. Swallow whole.    . bictegravir-emtricitabine-tenofovir AF (BIKTARVY) 50-200-25 MG TABS tablet Take 1 tablet by mouth daily. 30 tablet 0  . eplerenone (INSPRA) 25 MG tablet TAKE 1/2 TABLET BY MOUTH DAILY. 15 tablet 11  . fluticasone furoate-vilanterol (BREO ELLIPTA) 100-25  MCG/INH AEPB Inhale 1 puff into the lungs daily. 60 each 4  . hydrOXYzine (ATARAX/VISTARIL) 25 MG tablet TAKE 1-2 TABLETS BY MOUTH 2 TIMES DAILY AS NEEDED FOR ANXIETY. 60 tablet 3  . Melatonin 10 MG CAPS Take by mouth at bedtime.    . metoprolol succinate (TOPROL-XL) 25 MG 24 hr tablet Take 1 tablet (25 mg total) by mouth daily. For high blood pressure 90 tablet 2  . Multiple Vitamins-Minerals (MENS MULTIPLUS PO) Take by  mouth.    . nicotine polacrilex (NICORETTE MINI) 4 MG lozenge Take one three times daily to stop smoking 100 tablet 4  . sacubitril-valsartan (ENTRESTO) 49-51 MG Take 1 tablet by mouth 2 (two) times daily. 60 tablet 11  . tamsulosin (FLOMAX) 0.4 MG CAPS capsule Take 2 capsules (0.8 mg total) by mouth daily after supper. For Prostate health 60 capsule 3  . traZODone (DESYREL) 100 MG tablet Take 1 tablet (100 mg total) by mouth at bedtime as needed for sleep. 30 tablet 3  . valACYclovir (VALTREX) 500 MG tablet TAKE 1 TABLET BY MOUTH EVERY DAY FOR HERPES 90 tablet 0   No facility-administered medications prior to visit.      Review of Systems  Constitutional: Negative.  Negative for chills and fever.  HENT: Positive for dental problem. Negative for congestion, drooling, ear discharge, ear pain, postnasal drip, rhinorrhea, sinus pressure, sinus pain, sneezing, sore throat and trouble swallowing.   Eyes: Negative.   Respiratory: Negative.   Cardiovascular: Negative.   Gastrointestinal: Positive for nausea.  Endocrine: Negative.   Genitourinary: Negative.   Musculoskeletal: Negative.   Skin: Negative.   Neurological: Negative.   Hematological: Positive for adenopathy.  Psychiatric/Behavioral: Negative.        Objective:   Physical Exam    Vitals:   04/25/21 0834  BP: 109/76  Pulse: 85  Resp: 18  SpO2: 96%  Weight: 212 lb 6.4 oz (96.3 kg)  Height: 5\' 9"  (1.753 m)    Gen: Pleasant, well-nourished, in no distress,  normal affect  ENT: No lesions,  mouth clear,   oropharynx clear, no postnasal drip, there is mild periodontal disease and there is a chipped front lower tooth but no evidence of dental abscesses in the lower jaw  Neck: No JVD, no TMG, no carotid bruits, there is very minimal left cervical lymphadenopathy and they are nontender  Lungs: No use of accessory muscles, no dullness to percussion, clear without rales or rhonchi  Cardiovascular: RRR, heart sounds normal, no murmur or gallops, no peripheral edema  Abdomen: soft and NT, no HSM,  BS normal  Musculoskeletal: No deformities, no cyanosis or clubbing  Neuro: alert, non focal  Skin: Warm, no lesions or rashes  Cologuard NEG  Assessment & Plan:  I personally reviewed all images and lab data in the [facility] system as well as any outside material available during this office visit and agree with the  radiology impressions.   Cervical lymphadenitis Cervical lymphadenitis appears to actually be improving since the past week.  No evidence of oral infection  Will observe for now and have asked the patient to keep his follow-up appoint with infectious disease on the 20th of this month   Isak was seen today for pain and swelling on left side of neck x 10 days.  Diagnoses and all orders for this visit:  Cervical lymphadenitis  This was a work in visit of low complexity and I only spent about 12 minutes of time reviewing the patient's history and examining him and formulating a low complex decision making plan

## 2021-04-25 ENCOUNTER — Other Ambulatory Visit: Payer: Self-pay

## 2021-04-25 ENCOUNTER — Ambulatory Visit: Payer: Medicare Other | Attending: Critical Care Medicine | Admitting: Critical Care Medicine

## 2021-04-25 ENCOUNTER — Encounter: Payer: Self-pay | Admitting: Critical Care Medicine

## 2021-04-25 VITALS — BP 109/76 | HR 85 | Resp 18 | Ht 69.0 in | Wt 212.4 lb

## 2021-04-25 DIAGNOSIS — N400 Enlarged prostate without lower urinary tract symptoms: Secondary | ICD-10-CM | POA: Insufficient documentation

## 2021-04-25 DIAGNOSIS — Z21 Asymptomatic human immunodeficiency virus [HIV] infection status: Secondary | ICD-10-CM | POA: Insufficient documentation

## 2021-04-25 DIAGNOSIS — F319 Bipolar disorder, unspecified: Secondary | ICD-10-CM | POA: Insufficient documentation

## 2021-04-25 DIAGNOSIS — M542 Cervicalgia: Secondary | ICD-10-CM | POA: Diagnosis present

## 2021-04-25 DIAGNOSIS — I110 Hypertensive heart disease with heart failure: Secondary | ICD-10-CM | POA: Diagnosis not present

## 2021-04-25 DIAGNOSIS — Z7951 Long term (current) use of inhaled steroids: Secondary | ICD-10-CM | POA: Diagnosis not present

## 2021-04-25 DIAGNOSIS — Z7982 Long term (current) use of aspirin: Secondary | ICD-10-CM | POA: Diagnosis not present

## 2021-04-25 DIAGNOSIS — J449 Chronic obstructive pulmonary disease, unspecified: Secondary | ICD-10-CM | POA: Insufficient documentation

## 2021-04-25 DIAGNOSIS — I889 Nonspecific lymphadenitis, unspecified: Secondary | ICD-10-CM

## 2021-04-25 DIAGNOSIS — Z95810 Presence of automatic (implantable) cardiac defibrillator: Secondary | ICD-10-CM | POA: Diagnosis not present

## 2021-04-25 DIAGNOSIS — Z8249 Family history of ischemic heart disease and other diseases of the circulatory system: Secondary | ICD-10-CM | POA: Diagnosis not present

## 2021-04-25 DIAGNOSIS — F17210 Nicotine dependence, cigarettes, uncomplicated: Secondary | ICD-10-CM | POA: Diagnosis not present

## 2021-04-25 DIAGNOSIS — Z79899 Other long term (current) drug therapy: Secondary | ICD-10-CM | POA: Insufficient documentation

## 2021-04-25 DIAGNOSIS — I5022 Chronic systolic (congestive) heart failure: Secondary | ICD-10-CM | POA: Diagnosis not present

## 2021-04-25 NOTE — Patient Instructions (Addendum)
No indication for CAT scan or antibiotics  Please obtain a dentist dental resources given to you  Continue to work on smoking cessation as we previously discussed see below  Keep your appointment on the 20th with Dr. Alferd Apa the neck for now if the swelling worsens please call right away  Keep your future appointments with Dr. Delford Field as scheduled   Tobacco Use Disorder Tobacco use disorder (TUD) occurs when a person craves, seeks, and uses tobacco, regardless of the consequences. This disorder can cause problems with mental and physical health. It can affect your ability to have healthy relationships, and it can keep you from meeting your responsibilities at work, home, or school. Tobacco may be:  Smoked as a cigarette or cigar.  Inhaled using e-cigarettes.  Smoked in a pipe or hookah.  Chewed as smokeless tobacco.  Inhaled into the nostrils as snuff. Tobacco products contain a dangerous chemical called nicotine, which is very addictive. Nicotine triggers hormones that make the body feel stimulated and works on areas of the brain that make you feel good. These effects can make it hard for people to quit nicotine. Tobacco contains many other unsafe chemicals that can damage almost every organ in the body. Smoking tobacco also puts others in danger due to fire risk and possible health problems caused by breathing in secondhand smoke. What are the signs or symptoms? Symptoms of TUD may include:  Being unable to slow down or stop your tobacco use.  Spending an abnormal amount of time getting or using tobacco.  Craving tobacco. Cravings may last for up to 6 months after quitting.  Tobacco use that: ? Interferes with your work, school, or home life. ? Interferes with your personal and social relationships. ? Makes you give up activities that you once enjoyed or found important.  Using tobacco even though you know that it is: ? Dangerous or bad for your health or someone  else's health. ? Causing problems in your life.  Needing more and more of the substance to get the same effect (developing tolerance).  Experiencing unpleasant symptoms if you do not use the substance (withdrawal). Withdrawal symptoms may include: ? Depressed, anxious, or irritable mood. ? Difficulty concentrating. ? Increased appetite. ? Restlessness or trouble sleeping.  Using the substance to avoid withdrawal. How is this diagnosed? This condition may be diagnosed based on:  Your current and past tobacco use. Your health care provider may ask questions about how your tobacco use affects your life.  A physical exam. You may be diagnosed with TUD if you have at least two symptoms within a 1-month period. How is this treated? This condition is treated by stopping tobacco use. Many people are unable to quit on their own and need help. Treatment may include:  Nicotine replacement therapy (NRT). NRT provides nicotine without the other harmful chemicals in tobacco. NRT gradually lowers the dosage of nicotine in the body and reduces withdrawal symptoms. NRT is available as: ? Over-the-counter gums, lozenges, and skin patches. ? Prescription mouth inhalers and nasal sprays.  Medicine that acts on the brain to reduce cravings and withdrawal symptoms.  A type of talk therapy that examines your triggers for tobacco use, how to avoid them, and how to cope with cravings (behavioral therapy).  Hypnosis. This may help with withdrawal symptoms.  Joining a support group for others coping with TUD. The best treatment for TUD is usually a combination of medicine, talk therapy, and support groups. Recovery can be a long  process. Many people start using tobacco again after stopping (relapse). If you relapse, it does not mean that treatment will not work. Follow these instructions at home: Lifestyle  Do not use any products that contain nicotine or tobacco, such as cigarettes and  e-cigarettes.  Avoid things that trigger tobacco use as much as you can. Triggers include people and situations that usually cause you to use tobacco.  Avoid drinks that contain caffeine, including coffee. These may worsen some withdrawal symptoms.  Find ways to manage stress. Wanting to smoke may cause stress, and stress can make you want to smoke. Relaxation techniques such as deep breathing, meditation, and yoga may help.  Attend support groups as needed. These groups are an important part of long-term recovery for many people. General instructions  Take over-the-counter and prescription medicines only as told by your health care provider.  Check with your health care provider before taking any new prescription or over-the-counter medicines.  Decide on a friend, family member, or smoking quit-line (such as 1-800-QUIT-NOW in the [place] that you can call or text when you feel the urge to smoke or when you need help coping with cravings.  Keep all follow-up visits as told by your health care provider and therapist. This is important.   Contact a health care provider if:  You are not able to take your medicines as prescribed.  Your symptoms get worse, even with treatment. Summary  Tobacco use disorder (TUD) occurs when a person craves, seeks, and uses tobacco regardless of the consequences.  This condition may be diagnosed based on your current and past tobacco use and a physical exam.  Many people are unable to quit on their own and need help. Recovery can be a long process.  The most effective treatment for TUD is usually a combination of medicine, talk therapy, and support groups. This information is not intended to replace advice given to you by your health care provider. Make sure you discuss any questions you have with your health care provider. Document Revised: 11/21/2017 Document Reviewed: 11/21/2017 Elsevier Patient Education  2021 [employer].

## 2021-04-25 NOTE — Assessment & Plan Note (Signed)
Cervical lymphadenitis appears to actually be improving since the past week.  No evidence of oral infection  Will observe for now and have asked the patient to keep his follow-up appoint with infectious disease on the 20th of this month

## 2021-04-27 ENCOUNTER — Encounter (HOSPITAL_COMMUNITY): Payer: Self-pay

## 2021-05-06 ENCOUNTER — Other Ambulatory Visit: Payer: Self-pay | Admitting: Critical Care Medicine

## 2021-05-06 ENCOUNTER — Ambulatory Visit: Payer: Medicare Other | Admitting: Infectious Disease

## 2021-05-06 NOTE — Telephone Encounter (Signed)
Requested Prescriptions  Pending Prescriptions Disp Refills  . tamsulosin (FLOMAX) 0.4 MG CAPS capsule [Pharmacy Med Name: TAMSULOSIN 0.4MG  CAPSULES] 60 capsule 2    Sig: TAKE 2 CAPSULES(0.8MG  TOTAL) BY MOUTH EVERY DAY AFTER SUPPER FOR PROSTATE HEALTH     Urology: Alpha-Adrenergic Blocker Passed - 05/06/2021  3:21 PM      Passed - Last BP in normal range    BP Readings from Last 1 Encounters:  04/25/21 109/76         Passed - Valid encounter within last 12 months    Recent Outpatient Visits          1 week ago Cervical lymphadenitis   [facility] Storm Frisk, MD   1 month ago Mucopurulent chronic bronchitis [facility])   [facility] Storm Frisk, MD   4 months ago Chronic systolic heart failure [facility])   [facility] Storm Frisk, MD   1 year ago    [facility]   2 years ago Chronic pain of left ankle   [facility] Cain Saupe, MD      Future Appointments            In 2 weeks Delford Field Charlcie Cradle, MD [facility]

## 2021-05-22 NOTE — Progress Notes (Signed)
Subjective:    Patient ID: Ryan Long, male    DOB: 06-15-1961, 60 y.o.   MRN: [id number] 60 y.o.M here to est PCP  Hx HTN, CHF, NICM, AICD in place,  allergic rhinitis, COPD, subst use mood d/o, ETOH use, HIV, Cocaine use, MDD, PTSD, Bipolar, prior suicide attempts,   01/06/2021 Patient is here to establish primary care by way of a telephone visit.  He fell yesterday on the ice and his leg is too sore to come in for a visit Patient notes he is having left-sided hip pain since he fell on the ice but has not sought attention as of yet.  He is able to stand and pivot on the leg.  Patient moved from [place] to [place] in August 2021 and he is now seeking primary care.  He has a prior history of ethanol abuse but he states he now only drinks about 1 beer a month.  Also history of tobacco use about three quarters a pack a day of cigarettes are being utilized.  The patient has history of nonischemic cardiomyopathy systolic with an AICD in place.  He is now being followed by local [facility] cardiology.  History of PTSD bipolar major depressive disorder.  He has had prior suicide attempts but he is not suicidal at this time.  This patient has history of allergic rhinitis COPD cocaine use HIV and hypertension.  He does not have diabetes.  Patient does have recurrent outbreaks of herpes viral infection in the penis area and is on chronic Valtrex suppression.  He is on Biktarvy for HIV and is followed by our CID has upcoming appointments.  He is followed in the heart vascular advanced heart failure clinic and has follow-up appointments in March noted.  He has an echocardiogram pending in March.  Last echocardiogram showed an EF of 20%.  Patient CD4 counts are normal and he is on his Biktarvy and is compliant with therapy.  The patient is needing refills on all his medications and has no other specific complaints at this time.  03/22/2021 Patient is seen in follow-up for chronic systolic  heart failure, HIV, BPH, PTSD, tobacco use, major depression, bipolar disorder.  Since the last visit the patient is done very well he has been compliant with his HIV medications he did go to the emergency room on April 1 they gave him doxycycline for presumed STD he states he is improving on this.  He has not had any firing of his AICD he still smokes three quarters a pack a day of cigarettes no other complaints at this visit On arrival his blood pressure is good at 123/71  04/25/21 Since the last visit the patient is developed left neck swelling for 10 days.  This occurred after a lower tooth was being Ryan Long into the tooth appeared to chip off.  Patient continues to smoke a pack of cigarettes every 2 to 3 days.  Note his Cologuard test came back negative  Patient's had adequate breathing and no difficulty with shortness of breath blood pressure on arrival is excellent 109/76 he maintains his HIV therapy.  05/23/2021 Patient seen in return follow-up history of nonischemic cardiomyopathy chronic systolic heart failure hypertension HIV.  Patient had problems with cervical lymphadenitis in the left neck was seen a month ago here for short-term follow-up.  The neck area has completely resolved itself and is not recurred. Patient currently is looking for a dentist to address a left lower molar issue.  Was thought that the dental aspect was the cause of the adenitis  Patient has no other complaints today has minimal cough still smoking slightly less than a pack a day of cigarettes has not started his nicotine replacement as prescribed  On arrival blood pressure is good at 111/76  The patient is no longer using cocaine   Past Medical History:  Diagnosis Date  . Active smoker   . AICD (automatic cardioverter/defibrillator) present   . Alcohol abuse   . Anxiety   . AR (allergic rhinitis)   . Bipolar 1 disorder (HCC)   . Cervical lymphadenitis 04/20/2021  . CHF (congestive heart failure) (HCC)    . Chronic bronchitis (HCC)   . Chronic systolic heart failure (HCC)   . Controlled substance agreement signed 10/22/2018  . COPD (chronic obstructive pulmonary disease) (HCC)   . Cough 12/31/2018  . Crack cocaine use   . Depression   . Genital herpes   . HIV (human immunodeficiency virus infection) (HCC) dx'd 08/1993  . HLD (hyperlipidemia)   . Hypertension   . NICM (nonischemic cardiomyopathy) (HCC)    Echocardiogram 06/28/11: EF 30-35%, mild MR, mild LAE;  No CAD by coronary CT angiogram 3/12 at [facility]  . NSVT (nonsustained ventricular tachycardia) (HCC)   . PTSD (post-traumatic stress disorder)      Family History  Problem Relation Age of Onset  . Glaucoma Mother   . Mental illness Mother   . Vision loss Mother   . Hypertension Father   . CAD Father   . Mental illness Father   . Alcohol abuse Father   . Drug abuse Father   . Heart attack Father 24  . Early death Father   . Alcohol abuse Brother   . Drug abuse Brother   . Drug abuse Brother      Social History   Socioeconomic History  . Marital status: Single    Spouse name: Not on file  . Number of children: Not on file  . Years of education: Not on file  . Highest education level: Not on file  Occupational History  . Not on file  Tobacco Use  . Smoking status: Current Every Day Smoker    Packs/day: 0.75    Years: 38.00    Pack years: 28.50    Types: Cigarettes  . Smokeless tobacco: Never Used  . Tobacco comment: hoping welbutrin will help  Vaping Use  . Vaping Use: Never used  Substance and Sexual Activity  . Alcohol use: Not Currently    Alcohol/week: 6.0 standard drinks    Types: 6 Cans of beer per week    Comment: 01/08/2018 "stopped 06/2017"  . Drug use: Not Currently    Types: Cocaine    Comment: 01/08/2018 "stopped 06/2017"  . Sexual activity: Yes    Birth control/protection: Condom    Comment: pt. declined condoms  Other Topics Concern  . Not on file  Social History  Narrative  . Not on file   Social Determinants of Health   Financial Resource Strain: Not on file  Food Insecurity: Not on file  Transportation Needs: Not on file  Physical Activity: Not on file  Stress: Not on file  Social Connections: Not on file  Intimate Partner Violence: Not on file     No Known Allergies   Outpatient Medications Prior to Visit  Medication Sig Dispense Refill  . aspirin EC 81 MG tablet Take 81 mg by mouth daily. Swallow whole.    Marland Kitchen  bictegravir-emtricitabine-tenofovir AF (BIKTARVY) 50-200-25 MG TABS tablet Take 1 tablet by mouth daily. 30 tablet 0  . eplerenone (INSPRA) 25 MG tablet TAKE 1/2 TABLET BY MOUTH DAILY. 15 tablet 11  . fluticasone furoate-vilanterol (BREO ELLIPTA) 100-25 MCG/INH AEPB Inhale 1 puff into the lungs daily. 60 each 4  . hydrOXYzine (ATARAX/VISTARIL) 25 MG tablet TAKE 1-2 TABLETS BY MOUTH 2 TIMES DAILY AS NEEDED FOR ANXIETY. 60 tablet 3  . Melatonin 10 MG CAPS Take by mouth at bedtime.    . Multiple Vitamins-Minerals (MENS MULTIPLUS PO) Take by mouth.    . nicotine polacrilex (NICORETTE MINI) 4 MG lozenge Take one three times daily to stop smoking 100 tablet 4  . sacubitril-valsartan (ENTRESTO) 49-51 MG Take 1 tablet by mouth 2 (two) times daily. 60 tablet 11  . tamsulosin (FLOMAX) 0.4 MG CAPS capsule TAKE 2 CAPSULES(0.8MG  TOTAL) BY MOUTH EVERY DAY AFTER SUPPER FOR PROSTATE HEALTH 60 capsule 2  . metoprolol succinate (TOPROL-XL) 25 MG 24 hr tablet Take 1 tablet (25 mg total) by mouth daily. For high blood pressure 90 tablet 2  . traZODone (DESYREL) 100 MG tablet Take 1 tablet (100 mg total) by mouth at bedtime as needed for sleep. 30 tablet 3  . valACYclovir (VALTREX) 500 MG tablet TAKE 1 TABLET BY MOUTH EVERY DAY FOR HERPES 90 tablet 0   No facility-administered medications prior to visit.      Review of Systems  Constitutional: Negative.  Negative for chills and fever.  HENT: Positive for dental problem. Negative for congestion,  drooling, ear discharge, ear pain, postnasal drip, rhinorrhea, sinus pressure, sinus pain, sneezing, sore throat and trouble swallowing.   Eyes: Negative.   Respiratory: Negative.   Cardiovascular: Negative.   Gastrointestinal: Negative for nausea.  Endocrine: Negative.   Genitourinary: Negative.   Musculoskeletal: Negative.   Skin: Negative.   Neurological: Negative.   Hematological: Negative for adenopathy.  Psychiatric/Behavioral: Negative.        Objective:   Physical Exam    Vitals:   05/23/21 0842  BP: 111/76  Pulse: 85  Resp: 16  SpO2: 97%  Weight: 205 lb 3.2 oz (93.1 kg)    Gen: Pleasant, well-nourished, in no distress,  normal affect  ENT: No lesions,  mouth clear,  oropharynx clear, no postnasal drip, there is mild periodontal disease and there is a chipped front lower tooth but no evidence of dental abscesses in the lower jaw  Neck: No JVD, no TMG, no carotid bruits, left cervical lymphadenopathy has resolved  Lungs: No use of accessory muscles, no dullness to percussion, expired wheezes  Cardiovascular: RRR, heart sounds normal, no murmur or gallops, no peripheral edema  Abdomen: soft and NT, no HSM,  BS normal  Musculoskeletal: No deformities, no cyanosis or clubbing  Neuro: alert, non focal  Skin: Warm, no lesions or rashes    Assessment & Plan:  I personally reviewed all images and lab data in the [facility] system as well as any outside material available during this office visit and agree with the  radiology impressions.   Chronic systolic heart failure (HCC) Compensated at this time  No change in medications refills given   Hypertension Stable at this time no medication changes made  Chronic obstructive pulmonary disease (HCC) Stable on Breo inhaler but actively smoking  See tobacco assessment  Tobacco use disorder    . Current smoking consumption amount: 1 pack every day  . Dicsussion on advise to quit smoking and smoking impacts:  Cardiovascular lung impacts  .  Patient's willingness to quit: Willing to quit  . Methods to quit smoking discussed: Behavioral modification  . Medication management of smoking session drugs discussed: on wellbutrin   . Resources provided:  AVS   . Setting quit date not established  . Follow-up arranged 4 month   Time spent counseling the patient: 5 minutes     Oshay was seen today for follow-up.  Diagnoses and all orders for this visit:  HSV infection -     valACYclovir (VALTREX) 500 MG tablet; TAKE 1 TABLET BY MOUTH EVERY DAY FOR HERPES  Chronic systolic heart failure (HCC)  Secondary hypertension  Chronic obstructive pulmonary disease, unspecified COPD type (HCC)  Tobacco use disorder  Other orders -     metoprolol succinate (TOPROL-XL) 25 MG 24 hr tablet; Take 1 tablet (25 mg total) by mouth daily. For high blood pressure -     traZODone (DESYREL) 100 MG tablet; Take 1 tablet (100 mg total) by mouth at bedtime as needed for sleep.  A PCV 20 vaccine was given

## 2021-05-23 ENCOUNTER — Ambulatory Visit: Payer: Medicare Other | Attending: Critical Care Medicine | Admitting: Critical Care Medicine

## 2021-05-23 ENCOUNTER — Encounter (HOSPITAL_COMMUNITY): Payer: Medicare Other

## 2021-05-23 ENCOUNTER — Encounter: Payer: Self-pay | Admitting: Critical Care Medicine

## 2021-05-23 ENCOUNTER — Other Ambulatory Visit: Payer: Self-pay

## 2021-05-23 VITALS — BP 111/76 | HR 85 | Resp 16 | Wt 205.2 lb

## 2021-05-23 DIAGNOSIS — I5022 Chronic systolic (congestive) heart failure: Secondary | ICD-10-CM | POA: Diagnosis not present

## 2021-05-23 DIAGNOSIS — Z23 Encounter for immunization: Secondary | ICD-10-CM

## 2021-05-23 DIAGNOSIS — B009 Herpesviral infection, unspecified: Secondary | ICD-10-CM | POA: Diagnosis not present

## 2021-05-23 DIAGNOSIS — F17200 Nicotine dependence, unspecified, uncomplicated: Secondary | ICD-10-CM | POA: Diagnosis not present

## 2021-05-23 DIAGNOSIS — I159 Secondary hypertension, unspecified: Secondary | ICD-10-CM

## 2021-05-23 DIAGNOSIS — J449 Chronic obstructive pulmonary disease, unspecified: Secondary | ICD-10-CM | POA: Diagnosis not present

## 2021-05-23 MED ORDER — VALACYCLOVIR HCL 500 MG PO TABS: ORAL_TABLET | ORAL | 0 refills | Status: DC

## 2021-05-23 MED ORDER — METOPROLOL SUCCINATE ER 25 MG PO TB24: 25.0000 mg | ORAL_TABLET | Freq: Every day | ORAL | 2 refills | Status: DC

## 2021-05-23 MED ORDER — TRAZODONE HCL 100 MG PO TABS: 100.0000 mg | ORAL_TABLET | Freq: Every evening | ORAL | 3 refills | Status: DC | PRN

## 2021-05-23 NOTE — Assessment & Plan Note (Signed)
Stable at this time no medication changes made

## 2021-05-23 NOTE — Assessment & Plan Note (Signed)
Compensated at this time  No change in medications refills given

## 2021-05-23 NOTE — Assessment & Plan Note (Signed)
  .   Current smoking consumption amount: 1 pack every day  Dicsussion on advise to quit smoking and smoking impacts: Cardiovascular lung impacts  Patient's willingness to quit: Willing to quit  Methods to quit smoking discussed: Behavioral modification  Medication management of smoking session drugs discussed: on wellbutrin   Resources provided:  AVS   Setting quit date not established  Follow-up arranged 4 month   Time spent counseling the patient: 5 minutes   

## 2021-05-23 NOTE — Patient Instructions (Addendum)
Consider [facility] on [address] near [place] (old), or [facility] on [address] just [place] of [address]  No change in medications  Work on smoking cessation.  A pneumonia vaccine given  Return Dr Delford Field 4 months   Tobacco Use Disorder Tobacco use disorder (TUD) occurs when a person craves, seeks, and uses tobacco, regardless of the consequences. This disorder can cause problems with mental and physical health. It can affect your ability to have healthy relationships, and it can keep you from meeting your responsibilities at work, home, or school. Tobacco may be:  Smoked as a cigarette or cigar.  Inhaled using e-cigarettes.  Smoked in a pipe or hookah.  Chewed as smokeless tobacco.  Inhaled into the nostrils as snuff. Tobacco products contain a dangerous chemical called nicotine, which is very addictive. Nicotine triggers hormones that make the body feel stimulated and works on areas of the brain that make you feel good. These effects can make it hard for people to quit nicotine. Tobacco contains many other unsafe chemicals that can damage almost every organ in the body. Smoking tobacco also puts others in danger due to fire risk and possible health problems caused by breathing in secondhand smoke. What are the signs or symptoms? Symptoms of TUD may include:  Being unable to slow down or stop your tobacco use.  Spending an abnormal amount of time getting or using tobacco.  Craving tobacco. Cravings may last for up to 6 months after quitting.  Tobacco use that: ? Interferes with your work, school, or home life. ? Interferes with your personal and social relationships. ? Makes you give up activities that you once enjoyed or found important.  Using tobacco even though you know that it is: ? Dangerous or bad for your health or someone else's health. ? Causing problems in your life.  Needing more and more of the substance to get the same effect (developing  tolerance).  Experiencing unpleasant symptoms if you do not use the substance (withdrawal). Withdrawal symptoms may include: ? Depressed, anxious, or irritable mood. ? Difficulty concentrating. ? Increased appetite. ? Restlessness or trouble sleeping.  Using the substance to avoid withdrawal. How is this diagnosed? This condition may be diagnosed based on:  Your current and past tobacco use. Your health care provider may ask questions about how your tobacco use affects your life.  A physical exam. You may be diagnosed with TUD if you have at least two symptoms within a 18-month period. How is this treated? This condition is treated by stopping tobacco use. Many people are unable to quit on their own and need help. Treatment may include:  Nicotine replacement therapy (NRT). NRT provides nicotine without the other harmful chemicals in tobacco. NRT gradually lowers the dosage of nicotine in the body and reduces withdrawal symptoms. NRT is available as: ? Over-the-counter gums, lozenges, and skin patches. ? Prescription mouth inhalers and nasal sprays.  Medicine that acts on the brain to reduce cravings and withdrawal symptoms.  A type of talk therapy that examines your triggers for tobacco use, how to avoid them, and how to cope with cravings (behavioral therapy).  Hypnosis. This may help with withdrawal symptoms.  Joining a support group for others coping with TUD. The best treatment for TUD is usually a combination of medicine, talk therapy, and support groups. Recovery can be a long process. Many people start using tobacco again after stopping (relapse). If you relapse, it does not mean that treatment will not work. Follow these instructions  at home: Lifestyle  Do not use any products that contain nicotine or tobacco, such as cigarettes and e-cigarettes.  Avoid things that trigger tobacco use as much as you can. Triggers include people and situations that usually cause you to use  tobacco.  Avoid drinks that contain caffeine, including coffee. These may worsen some withdrawal symptoms.  Find ways to manage stress. Wanting to smoke may cause stress, and stress can make you want to smoke. Relaxation techniques such as deep breathing, meditation, and yoga may help.  Attend support groups as needed. These groups are an important part of long-term recovery for many people. General instructions  Take over-the-counter and prescription medicines only as told by your health care provider.  Check with your health care provider before taking any new prescription or over-the-counter medicines.  Decide on a friend, family member, or smoking quit-line (such as 1-800-QUIT-NOW in the [place] that you can call or text when you feel the urge to smoke or when you need help coping with cravings.  Keep all follow-up visits as told by your health care provider and therapist. This is important.   Contact a health care provider if:  You are not able to take your medicines as prescribed.  Your symptoms get worse, even with treatment. Summary  Tobacco use disorder (TUD) occurs when a person craves, seeks, and uses tobacco regardless of the consequences.  This condition may be diagnosed based on your current and past tobacco use and a physical exam.  Many people are unable to quit on their own and need help. Recovery can be a long process.  The most effective treatment for TUD is usually a combination of medicine, talk therapy, and support groups. This information is not intended to replace advice given to you by your health care provider. Make sure you discuss any questions you have with your health care provider. Document Revised: 11/21/2017 Document Reviewed: 11/21/2017 Elsevier Patient Education  2021 [employer].

## 2021-05-23 NOTE — Assessment & Plan Note (Signed)
Stable on Breo inhaler but actively smoking  See tobacco assessment

## 2021-06-02 ENCOUNTER — Telehealth: Payer: Self-pay | Admitting: Critical Care Medicine

## 2021-06-02 ENCOUNTER — Ambulatory Visit: Payer: Self-pay | Admitting: *Deleted

## 2021-06-02 NOTE — Telephone Encounter (Signed)
Returned pt's call. During triage pt was called by Dr. Delford Field. NT did CB pt, states he was given instructions for care, monitoring by Dr. Delford Field.

## 2021-06-02 NOTE — Telephone Encounter (Signed)
Pt  called with c/o insect bite on left bicep area of upper arm  occurred one week ago  area is round with a ring of red inflammation and central blanching c/w with black widow spider bite  Not any evidence for acute hemolytic anemia.   No systemic symptoms  No dark urine   Pt advised to apply ice, local care , mild soap and water , OTC pain reflief tylenol or ibuprofen  If worsens told to go to UC or ED  No other medication changes

## 2021-06-02 NOTE — Telephone Encounter (Signed)
Pt called saying he got bit by something on his arm about a week ago.  It has been turning colors like purple and black.  He said it is not itching or hurting but just looks purplish and swollen.

## 2021-06-03 ENCOUNTER — Other Ambulatory Visit: Payer: Self-pay | Admitting: Critical Care Medicine

## 2021-06-06 ENCOUNTER — Telehealth: Payer: Self-pay | Admitting: Critical Care Medicine

## 2021-06-06 NOTE — Telephone Encounter (Signed)
Lesion on spider bite worse,  I told pt to go to [facility] on [facility] pt endorsed he will do so

## 2021-06-07 ENCOUNTER — Ambulatory Visit (HOSPITAL_COMMUNITY)
Admission: EM | Admit: 2021-06-07 | Discharge: 2021-06-07 | Disposition: A | Discharge status: Home / Self Care | Payer: Medicare Other | Attending: Student | Admitting: Student

## 2021-06-07 ENCOUNTER — Other Ambulatory Visit: Payer: Self-pay

## 2021-06-07 ENCOUNTER — Emergency Department (HOSPITAL_COMMUNITY)
Admission: EM | Admit: 2021-06-07 | Discharge: 2021-06-07 | Disposition: A | Discharge status: Home / Self Care | Payer: Medicare Other | Attending: Emergency Medicine | Admitting: Emergency Medicine

## 2021-06-07 ENCOUNTER — Encounter (HOSPITAL_COMMUNITY): Payer: Self-pay | Admitting: Emergency Medicine

## 2021-06-07 DIAGNOSIS — W57XXXA Bitten or stung by nonvenomous insect and other nonvenomous arthropods, initial encounter: Secondary | ICD-10-CM

## 2021-06-07 DIAGNOSIS — Z5321 Procedure and treatment not carried out due to patient leaving prior to being seen by health care provider: Secondary | ICD-10-CM | POA: Insufficient documentation

## 2021-06-07 DIAGNOSIS — Z23 Encounter for immunization: Secondary | ICD-10-CM

## 2021-06-07 DIAGNOSIS — L03114 Cellulitis of left upper limb: Secondary | ICD-10-CM

## 2021-06-07 DIAGNOSIS — Y9302 Activity, running: Secondary | ICD-10-CM | POA: Insufficient documentation

## 2021-06-07 DIAGNOSIS — S40862A Insect bite (nonvenomous) of left upper arm, initial encounter: Secondary | ICD-10-CM

## 2021-06-07 DIAGNOSIS — B20 Human immunodeficiency virus [HIV] disease: Secondary | ICD-10-CM | POA: Diagnosis not present

## 2021-06-07 MED ORDER — TETANUS-DIPHTH-ACELL PERTUSSIS 5-2.5-18.5 LF-MCG/0.5 IM SUSY
0.5000 mL | PREFILLED_SYRINGE | Freq: Once | INTRAMUSCULAR | Status: AC
  Administered 2021-06-07: 0.5 mL via INTRAMUSCULAR

## 2021-06-07 MED ORDER — DOXYCYCLINE HYCLATE 100 MG PO CAPS: 100.0000 mg | ORAL_CAPSULE | Freq: Two times a day (BID) | ORAL | 0 refills | Status: AC

## 2021-06-07 NOTE — ED Triage Notes (Signed)
Pt presents with insect bite on left arm arm xs 1.5 weeks. Pt states area has grown with redness and itching.

## 2021-06-07 NOTE — ED Provider Notes (Signed)
[facility]    CSN: [id number] Arrival date & time: 06/07/21  0802      History   Chief Complaint Chief Complaint  Patient presents with   Insect Bite    HPI Ryan Long is a 60 y.o. male presenting with concern for spider bite x1.5 weeks, getting worse. Medical history smoking, anxiety, COPD, cardiomyopathy, HIV.  States that spider bite developed 1.5 weeks ago, states the area has gotten more red and tender since then.  Spoke with his primary care who advised him to come here.  Tetanus up-to-date 2015.  Denies fever/chills, discharge from the area.  HPI  Past Medical History:  Diagnosis Date   Active smoker    AICD (automatic cardioverter/defibrillator) present    Alcohol abuse    Anxiety    AR (allergic rhinitis)    Bipolar 1 disorder (HCC)    Cervical lymphadenitis 04/20/2021   CHF (congestive heart failure) (HCC)    Chronic bronchitis (HCC)    Chronic systolic heart failure (HCC)    Controlled substance agreement signed 10/22/2018   COPD (chronic obstructive pulmonary disease) (HCC)    Cough 12/31/2018   Crack cocaine use    Depression    Genital herpes    HIV (human immunodeficiency virus infection) (HCC) dx'd 08/1993   HLD (hyperlipidemia)    Hypertension    NICM (nonischemic cardiomyopathy) (HCC)    Echocardiogram 06/28/11: EF 30-35%, mild MR, mild LAE;  No CAD by coronary CT angiogram 3/12 at [facility]   NSVT (nonsustained ventricular tachycardia) (HCC)    PTSD (post-traumatic stress disorder)     Patient Active Problem List   Diagnosis Date Noted   Cervical lymphadenitis 04/20/2021   Hip pain 01/06/2021   VT (ventricular tachycardia) (HCC) 08/25/2020   HIV positive (HCC) 07/12/2020   Atrial fibrillation (HCC) 06/10/2020   BPH (benign prostatic hyperplasia) 01/28/2020   HFrEF (heart failure with reduced ejection fraction) (HCC) 01/28/2020   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   AICD (automatic  cardioverter/defibrillator) present 08/15/2018   Abnormal EKG 06/28/2018   Adjustment disorder with mixed disturbance of emotions and conduct    NICM (nonischemic cardiomyopathy) (HCC) 01/08/2018   Anxiety 12/20/2017   Bipolar 1 disorder (HCC) 10/10/2017   Chronic systolic heart failure (HCC) 05/13/2017   HSV-2 infection 07/17/2016   History of attempted suicide 04/14/2016   History of alcoholism (HCC) 04/14/2016   History of substance abuse (HCC) 04/14/2016   Constipation 01/17/2016   Chronic obstructive pulmonary disease (HCC) 10/04/2015   Abnormal thyroid stimulating hormone (TSH) level 10/04/2015   MDD (major depressive disorder), recurrent episode, severe (HCC) 09/23/2015   Hypertension 09/10/2015   PTSD (post-traumatic stress disorder) 08/07/2015   Primary insomnia 04/02/2015   Herpesviral infection of penis 04/02/2015   Generalized anxiety disorder 05/29/2014   Tobacco use disorder 02/17/2013   Heart failure (HCC) 09/19/2011   Other primary cardiomyopathies 08/24/2011   Human immunodeficiency virus (HIV) disease (HCC) 02/01/2009   Recurrent HSV (herpes simplex virus) 02/01/2009   Hyperlipidemia 02/01/2009   Allergic rhinitis 02/01/2009    Past Surgical History:  Procedure Laterality Date   CARDIAC DEFIBRILLATOR PLACEMENT  01/08/2018   ICD IMPLANT N/A 01/08/2018   Procedure: ICD IMPLANT;  Surgeon: Regan Lemming, MD;  Location: [facility];  Service: Cardiovascular;  Laterality: N/A;       Home Medications    Prior to Admission medications   Medication Sig Start Date End Date Taking? Authorizing Provider  doxycycline (VIBRAMYCIN) 100 MG  capsule Take 1 capsule (100 mg total) by mouth 2 (two) times daily for 7 days. 06/07/21 06/14/21 Yes Rhys Martini, PA-C  aspirin EC 81 MG tablet Take 81 mg by mouth daily. Swallow whole.    [provider]  bictegravir-emtricitabine-tenofovir AF (BIKTARVY) 50-200-25 MG TABS tablet Take 1 tablet by mouth daily.  04/20/21   Randall Hiss, MD  eplerenone (INSPRA) 25 MG tablet TAKE 1/2 TABLET BY MOUTH DAILY. 10/12/20   Bensimhon, Bevelyn Buckles, MD  fluticasone furoate-vilanterol (BREO ELLIPTA) 100-25 MCG/INH AEPB Inhale 1 puff into the lungs daily. 03/22/21   Storm Frisk, MD  hydrOXYzine (ATARAX/VISTARIL) 25 MG tablet TAKE 1-2 TABLETS BY MOUTH 2 TIMES DAILY AS NEEDED FOR ANXIETY. 12/03/20   Clegg, Amy D, NP  Melatonin 10 MG CAPS Take by mouth at bedtime.    [provider]  metoprolol succinate (TOPROL-XL) 25 MG 24 hr tablet Take 1 tablet (25 mg total) by mouth daily. For high blood pressure 05/23/21   Storm Frisk, MD  Multiple Vitamins-Minerals (MENS MULTIPLUS PO) Take by mouth.    [provider]  nicotine polacrilex (NICORETTE MINI) 4 MG lozenge Take one three times daily to stop smoking 03/22/21   Storm Frisk, MD  sacubitril-valsartan (ENTRESTO) 49-51 MG Take 1 tablet by mouth 2 (two) times daily. Patient taking differently: Take 0.5 tablets by mouth 2 (two) times daily. 04/20/21   Bensimhon, Bevelyn Buckles, MD  tamsulosin (FLOMAX) 0.4 MG CAPS capsule TAKE 2 CAPSULES(0.8MG  TOTAL) BY MOUTH EVERY DAY AFTER SUPPER FOR PROSTATE HEALTH 05/06/21   Storm Frisk, MD  traZODone (DESYREL) 100 MG tablet Take 1 tablet (100 mg total) by mouth at bedtime as needed for sleep. 05/23/21   Storm Frisk, MD  valACYclovir (VALTREX) 500 MG tablet TAKE 1 TABLET BY MOUTH EVERY DAY FOR HERPES 05/23/21   Storm Frisk, MD    Family History Family History  Problem Relation Age of Onset   Glaucoma Mother    Mental illness Mother    Vision loss Mother    Hypertension Father    CAD Father    Mental illness Father    Alcohol abuse Father    Drug abuse Father    Heart attack Father 45   Early death Father    Alcohol abuse Brother    Drug abuse Brother    Drug abuse Brother     Social History Social History   Tobacco Use   Smoking status: Every Day    Packs/day: 0.75    Years: 38.00     Pack years: 28.50    Types: Cigarettes   Smokeless tobacco: Never   Tobacco comments:    hoping welbutrin will help  Vaping Use   Vaping Use: Never used  Substance Use Topics   Alcohol use: Not Currently    Alcohol/week: 6.0 standard drinks    Types: 6 Cans of beer per week    Comment: 01/08/2018 "stopped 06/2017"   Drug use: Not Currently    Types: Cocaine    Comment: 01/08/2018 "stopped 06/2017"     Allergies   Patient has no known allergies.   Review of Systems Review of Systems  Skin:  Positive for color change.  All other systems reviewed and are negative.   Physical Exam Triage Vital Signs ED Triage Vitals [06/07/21 0813]  Enc Vitals Group     BP      Pulse      Resp  Temp      Temp src      SpO2      Weight      Height      Head Circumference      Peak Flow      Pain Score 6     Pain Loc      Pain Edu?      Excl. in GC?    No data found.  Updated Vital Signs BP 115/81 (BP Location: Right Arm)   Pulse 93   Temp 98.2 F (36.8 C) (Oral)   Resp 16   SpO2 96%   Visual Acuity Right Eye Distance:   Left Eye Distance:   Bilateral Distance:    Right Eye Near:   Left Eye Near:    Bilateral Near:     Physical Exam Vitals reviewed.  Constitutional:      General: He is not in acute distress.    Appearance: Normal appearance. He is not ill-appearing or diaphoretic.  HENT:     Head: Normocephalic and atraumatic.  Cardiovascular:     Rate and Rhythm: Normal rate and regular rhythm.     Heart sounds: Normal heart sounds.  Pulmonary:     Effort: Pulmonary effort is normal.     Breath sounds: Normal breath sounds.  Skin:    General: Skin is warm.     Comments: See image below L arm with 4x4cm area of erythema with mild warmth. No induration, fluctuance, discharge.   Neurological:     General: No focal deficit present.     Mental Status: He is alert and oriented to person, place, and time.  Psychiatric:        Mood and Affect: Mood normal.         Behavior: Behavior normal.        Thought Content: Thought content normal.        Judgment: Judgment normal.       UC Treatments / Results  Labs (all labs ordered are listed, but only abnormal results are displayed) Labs Reviewed - No data to display  EKG   Radiology No results found.  Procedures Procedures (including critical care time)  Medications Ordered in UC Medications  Tdap (BOOSTRIX) injection 0.5 mL (has no administration in time range)    Initial Impression / Assessment and Plan / UC Course  I have reviewed the triage vital signs and the nursing notes.  Pertinent labs & imaging results that were available during my care of the patient were reviewed by me and considered in my medical decision making (see chart for details).     This patient is a 60 year old male presenting with cellulitis related to insect bite.  Afebrile, nontachycardic.  This patient does have HIV, nondetectable 5/22.  Tetanus not up-to-date, administered today.  Patient denies tick bite, he does not have symptoms of Lyme, but will cover with doxycycline for 7 days.  Wound care discussed.  ED return precautions discussed.  Final Clinical Impressions(s) / UC Diagnoses   Final diagnoses:  Cellulitis of left upper extremity  Insect bite of left upper arm, initial encounter  Need for Tdap vaccination  HIV disease [facility])     Discharge Instructions      -Start the antibiotic, doxycycline twice daily for 7 days.  Make sure to wear sunscreen while spending periods of time outside on this medication as it can increase your chance of sunburn. -Wash your wound with gentle soap and water 1-2 times daily.  Let air dry or gently pat. -Seek additional medical attention if you develop new symptoms like fever/chills, worsening of the redness, discharge from the wound, etc     ED Prescriptions     Medication Sig Dispense Auth. Provider   doxycycline (VIBRAMYCIN) 100 MG capsule Take 1 capsule  (100 mg total) by mouth 2 (two) times daily for 7 days. 14 capsule Rhys Martini, PA-C      PDMP not reviewed this encounter.   Rhys Martini, PA-C 06/07/21 [phone]

## 2021-06-07 NOTE — Discharge Instructions (Addendum)
-  Start the antibiotic, doxycycline twice daily for 7 days.  Make sure to wear sunscreen while spending periods of time outside on this medication as it can increase your chance of sunburn. -Wash your wound with gentle soap and water 1-2 times daily.  Let air dry or gently pat. -Seek additional medical attention if you develop new symptoms like fever/chills, worsening of the redness, discharge from the wound, etc

## 2021-06-07 NOTE — ED Triage Notes (Signed)
Pt arrives for eval of red itchy area to L upper arm x 2 weeks. Reports running into a web but never seeing the insect that bit him.

## 2021-06-09 ENCOUNTER — Other Ambulatory Visit: Payer: Self-pay | Admitting: *Deleted

## 2021-06-09 DIAGNOSIS — B20 Human immunodeficiency virus [HIV] disease: Secondary | ICD-10-CM

## 2021-06-09 MED ORDER — BIKTARVY 50-200-25 MG PO TABS: 1.0000 | ORAL_TABLET | Freq: Every day | ORAL | 5 refills | Status: DC

## 2021-06-10 ENCOUNTER — Other Ambulatory Visit: Payer: Self-pay | Admitting: Critical Care Medicine

## 2021-06-10 DIAGNOSIS — B009 Herpesviral infection, unspecified: Secondary | ICD-10-CM

## 2021-06-13 ENCOUNTER — Encounter (HOSPITAL_COMMUNITY): Payer: Medicare Other

## 2021-06-13 ENCOUNTER — Telehealth (HOSPITAL_COMMUNITY): Payer: Self-pay | Admitting: *Deleted

## 2021-06-13 NOTE — Telephone Encounter (Signed)
Contacted patient to cancel and reschedule CPX scheduled for today at 10am, due to provider. Unable to reach patient but left a message and contact [phone] to reschedule appointment.     Lesia Hausen, MS, ACSM, [facility] Clinical Exercise Physiologist/ Health and Wellness Coach

## 2021-06-29 ENCOUNTER — Ambulatory Visit: Payer: Medicare Other | Admitting: Family

## 2021-07-12 ENCOUNTER — Encounter (HOSPITAL_COMMUNITY): Payer: Medicare Other

## 2021-07-21 ENCOUNTER — Other Ambulatory Visit (HOSPITAL_COMMUNITY): Payer: Self-pay | Admitting: *Deleted

## 2021-07-21 ENCOUNTER — Encounter (HOSPITAL_COMMUNITY): Payer: Self-pay

## 2021-07-21 MED ORDER — HYDROXYZINE HCL 25 MG PO TABS: ORAL_TABLET | ORAL | 3 refills | Status: DC

## 2021-07-25 ENCOUNTER — Encounter (HOSPITAL_COMMUNITY): Payer: Self-pay

## 2021-07-25 ENCOUNTER — Encounter (HOSPITAL_COMMUNITY): Payer: Medicare Other | Admitting: Internal Medicine

## 2021-07-28 ENCOUNTER — Other Ambulatory Visit (HOSPITAL_COMMUNITY): Payer: Self-pay | Admitting: Cardiology

## 2021-08-01 ENCOUNTER — Encounter (HOSPITAL_COMMUNITY): Payer: Self-pay

## 2021-08-01 ENCOUNTER — Ambulatory Visit (HOSPITAL_COMMUNITY)
Admission: EM | Admit: 2021-08-01 | Discharge: 2021-08-01 | Disposition: A | Discharge status: Home / Self Care | Payer: Medicare Other | Attending: Internal Medicine | Admitting: Internal Medicine

## 2021-08-01 ENCOUNTER — Other Ambulatory Visit: Payer: Self-pay

## 2021-08-01 DIAGNOSIS — Z202 Contact with and (suspected) exposure to infections with a predominantly sexual mode of transmission: Secondary | ICD-10-CM

## 2021-08-01 DIAGNOSIS — R369 Urethral discharge, unspecified: Secondary | ICD-10-CM | POA: Diagnosis not present

## 2021-08-01 LAB — POCT URINALYSIS DIPSTICK, ED / UC
Bilirubin Urine: NEGATIVE
Glucose, UA: NEGATIVE mg/dL
Hgb urine dipstick: NEGATIVE
Ketones, ur: NEGATIVE mg/dL
Leukocytes,Ua: NEGATIVE
Nitrite: NEGATIVE
Protein, ur: NEGATIVE mg/dL
Specific Gravity, Urine: 1.015 (ref 1.005–1.030)
Urobilinogen, UA: 0.2 mg/dL (ref 0.0–1.0)
pH: 5.5 (ref 5.0–8.0)

## 2021-08-01 NOTE — ED Triage Notes (Signed)
Pt requesting to be tested for STDs d/t exposure Symptoms: burning on urination, urinary frequency, discharge  Started: 2 days ago

## 2021-08-01 NOTE — ED Notes (Signed)
Pt left room before provider discharged.

## 2021-08-01 NOTE — ED Provider Notes (Signed)
[facility]    CSN: [id number] Arrival date & time: 08/01/21  0802      History   Chief Complaint Chief Complaint  Patient presents with   Exposure to STD    HPI Ryan Long is a 60 y.o. male.   Patient here for evaluation of dysuria, frequency, and watery penile discharge that has been ongoing for the past 2 days.  Reports having sex with a male partner several days ago without condoms or other protection.  Denies any known exposure to STI.  Has not tried any OTC medication or treatment.  Denies any trauma, injury, or other precipitating event.  Denies any specific alleviating or aggravating factors.  Denies any fevers, chest pain, shortness of breath, N/V/D, numbness, tingling, weakness, abdominal pain, or headaches.     The history is provided by the patient.  Exposure to STD   Past Medical History:  Diagnosis Date   Active smoker    AICD (automatic cardioverter/defibrillator) present    Alcohol abuse    Anxiety    AR (allergic rhinitis)    Bipolar 1 disorder (HCC)    Cervical lymphadenitis 04/20/2021   CHF (congestive heart failure) (HCC)    Chronic bronchitis (HCC)    Chronic systolic heart failure (HCC)    Controlled substance agreement signed 10/22/2018   COPD (chronic obstructive pulmonary disease) (HCC)    Cough 12/31/2018   Crack cocaine use    Depression    Genital herpes    HIV (human immunodeficiency virus infection) (HCC) dx'd 08/1993   HLD (hyperlipidemia)    Hypertension    NICM (nonischemic cardiomyopathy) (HCC)    Echocardiogram 06/28/11: EF 30-35%, mild MR, mild LAE;  No CAD by coronary CT angiogram 3/12 at [facility]   NSVT (nonsustained ventricular tachycardia) (HCC)    PTSD (post-traumatic stress disorder)     Patient Active Problem List   Diagnosis Date Noted   Cervical lymphadenitis 04/20/2021   Hip pain 01/06/2021   VT (ventricular tachycardia) (HCC) 08/25/2020   HIV positive (HCC) 07/12/2020   Atrial  fibrillation (HCC) 06/10/2020   BPH (benign prostatic hyperplasia) 01/28/2020   HFrEF (heart failure with reduced ejection fraction) (HCC) 01/28/2020   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   AICD (automatic cardioverter/defibrillator) present 08/15/2018   Abnormal EKG 06/28/2018   Adjustment disorder with mixed disturbance of emotions and conduct    NICM (nonischemic cardiomyopathy) (HCC) 01/08/2018   Anxiety 12/20/2017   Bipolar 1 disorder (HCC) 10/10/2017   Chronic systolic heart failure (HCC) 05/13/2017   HSV-2 infection 07/17/2016   History of attempted suicide 04/14/2016   History of alcoholism (HCC) 04/14/2016   History of substance abuse (HCC) 04/14/2016   Constipation 01/17/2016   Chronic obstructive pulmonary disease (HCC) 10/04/2015   Abnormal thyroid stimulating hormone (TSH) level 10/04/2015   MDD (major depressive disorder), recurrent episode, severe (HCC) 09/23/2015   Hypertension 09/10/2015   PTSD (post-traumatic stress disorder) 08/07/2015   Primary insomnia 04/02/2015   Herpesviral infection of penis 04/02/2015   Generalized anxiety disorder 05/29/2014   Tobacco use disorder 02/17/2013   Heart failure (HCC) 09/19/2011   Other primary cardiomyopathies 08/24/2011   Human immunodeficiency virus (HIV) disease (HCC) 02/01/2009   Recurrent HSV (herpes simplex virus) 02/01/2009   Hyperlipidemia 02/01/2009   Allergic rhinitis 02/01/2009    Past Surgical History:  Procedure Laterality Date   CARDIAC DEFIBRILLATOR PLACEMENT  01/08/2018   ICD IMPLANT N/A 01/08/2018   Procedure: ICD IMPLANT;  Surgeon: Regan Lemming, MD;  [facility];  Service: Cardiovascular;  Laterality: N/A;       Home Medications    Prior to Admission medications   Medication Sig Start Date End Date Taking? Authorizing Provider  aspirin EC 81 MG tablet Take 81 mg by mouth daily. Swallow whole.   Yes [provider]  bictegravir-emtricitabine-tenofovir AF  (BIKTARVY) 50-200-25 MG TABS tablet Take 1 tablet by mouth daily. 06/09/21  Yes Daiva Eves, Lisette Grinder, MD  eplerenone (INSPRA) 25 MG tablet TAKE 1/2 TABLET BY MOUTH DAILY. 10/12/20  Yes Bensimhon, Bevelyn Buckles, MD  hydrOXYzine (ATARAX/VISTARIL) 25 MG tablet TAKE 1-2 TABLETS BY MOUTH 2 TIMES DAILY AS NEEDED FOR ANXIETY. 07/21/21  Yes Laurey Morale, MD  Melatonin 10 MG CAPS Take by mouth at bedtime.   Yes [provider]  metoprolol succinate (TOPROL-XL) 25 MG 24 hr tablet Take 1 tablet (25 mg total) by mouth daily. For high blood pressure 05/23/21  Yes Storm Frisk, MD  tamsulosin (FLOMAX) 0.4 MG CAPS capsule TAKE 2 CAPSULES(0.8MG  TOTAL) BY MOUTH EVERY DAY AFTER SUPPER FOR PROSTATE HEALTH 05/06/21  Yes Storm Frisk, MD  traZODone (DESYREL) 100 MG tablet Take 1 tablet (100 mg total) by mouth at bedtime as needed for sleep. 05/23/21  Yes Storm Frisk, MD  valACYclovir (VALTREX) 500 MG tablet TAKE 1 TABLET BY MOUTH EVERY DAY FOR HERPES 05/23/21  Yes Storm Frisk, MD  fluticasone furoate-vilanterol (BREO ELLIPTA) 100-25 MCG/INH AEPB Inhale 1 puff into the lungs daily. 03/22/21   Storm Frisk, MD  Multiple Vitamins-Minerals (MENS MULTIPLUS PO) Take by mouth.    [provider]  nicotine polacrilex (NICORETTE MINI) 4 MG lozenge Take one three times daily to stop smoking 03/22/21   Storm Frisk, MD  sacubitril-valsartan (ENTRESTO) 49-51 MG Take 1 tablet by mouth 2 (two) times daily. Patient taking differently: Take 0.5 tablets by mouth 2 (two) times daily. 04/20/21   Bensimhon, Bevelyn Buckles, MD    Family History Family History  Problem Relation Age of Onset   Glaucoma Mother    Mental illness Mother    Vision loss Mother    Hypertension Father    CAD Father    Mental illness Father    Alcohol abuse Father    Drug abuse Father    Heart attack Father 51   Early death Father    Alcohol abuse Brother    Drug abuse Brother    Drug abuse Brother     Social History Social  History   Tobacco Use   Smoking status: Every Day    Packs/day: 0.75    Years: 38.00    Pack years: 28.50    Types: Cigarettes   Smokeless tobacco: Never   Tobacco comments:    hoping welbutrin will help  Vaping Use   Vaping Use: Never used  Substance Use Topics   Alcohol use: Not Currently    Alcohol/week: 6.0 standard drinks    Types: 6 Cans of beer per week    Comment: 01/08/2018 "stopped 06/2017"   Drug use: Not Currently    Types: Cocaine    Comment: 01/08/2018 "stopped 06/2017"     Allergies   Patient has no known allergies.   Review of Systems Review of Systems  Genitourinary:  Positive for dysuria, frequency, penile discharge and urgency. Negative for penile pain, penile swelling and scrotal swelling.  All other systems reviewed and are negative.   Physical Exam Triage Vital Signs ED Triage Vitals  Enc Vitals Group     BP 08/01/21 0823 93/64     Pulse Rate 08/01/21 0823 100     Resp 08/01/21 0823 18     Temp 08/01/21 0823 98.1 F (36.7 C)     Temp Source 08/01/21 0823 Oral     SpO2 08/01/21 0823 97 %     Weight --      Height --      Head Circumference --      Peak Flow --      Pain Score 08/01/21 0818 7     Pain Loc --      Pain Edu? --      Excl. in GC? --    No data found.  Updated Vital Signs BP 93/64 (BP Location: Right Arm)   Pulse 100   Temp 98.1 F (36.7 C) (Oral)   Resp 18   SpO2 97%   Visual Acuity Right Eye Distance:   Left Eye Distance:   Bilateral Distance:    Right Eye Near:   Left Eye Near:    Bilateral Near:     Physical Exam Vitals and nursing note reviewed.  Constitutional:      General: He is not in acute distress.    Appearance: Normal appearance. He is not ill-appearing, toxic-appearing or diaphoretic.  HENT:     Head: Normocephalic and atraumatic.  Eyes:     Conjunctiva/sclera: Conjunctivae normal.  Cardiovascular:     Rate and Rhythm: Normal rate.     Pulses: Normal pulses.  Pulmonary:     Effort:  Pulmonary effort is normal.  Abdominal:     General: Abdomen is flat.     Tenderness: There is no right CVA tenderness or left CVA tenderness.  Genitourinary:    Comments: declines Musculoskeletal:        General: Normal range of motion.     Cervical back: Normal range of motion.  Skin:    General: Skin is warm and dry.  Neurological:     General: No focal deficit present.     Mental Status: He is alert and oriented to person, place, and time.  Psychiatric:        Mood and Affect: Mood normal.     UC Treatments / Results  Labs (all labs ordered are listed, but only abnormal results are displayed) Labs Reviewed  POCT URINALYSIS DIPSTICK, ED / UC  CYTOLOGY, (ORAL, ANAL, URETHRAL) ANCILLARY ONLY    EKG   Radiology No results found.  Procedures Procedures (including critical care time)  Medications Ordered in UC Medications - No data to display  Initial Impression / Assessment and Plan / UC Course  I have reviewed the triage vital signs and the nursing notes.  Pertinent labs & imaging results that were available during my care of the patient were reviewed by me and considered in my medical decision making (see chart for details).    Assessment negative for red flags or concerns.  Urinalysis with no signs of infection.  Self swab obtained and will treat based on results.  Attempted to discuss results and testing process with patient but he became agitated and stormed out of the room prior to discussion.   Final Clinical Impressions(s) / UC Diagnoses   Final diagnoses:  Penile discharge  Possible exposure to STD   Discharge Instructions   None    ED Prescriptions   None    PDMP not reviewed this encounter.   Ivette Loyal, NP 08/01/21 [phone]

## 2021-08-02 LAB — CYTOLOGY, (ORAL, ANAL, URETHRAL) ANCILLARY ONLY
Chlamydia: NEGATIVE
Comment: NEGATIVE
Comment: NEGATIVE
Comment: NORMAL
Neisseria Gonorrhea: NEGATIVE
Trichomonas: NEGATIVE

## 2021-08-29 ENCOUNTER — Emergency Department (HOSPITAL_COMMUNITY): Payer: 59

## 2021-08-29 ENCOUNTER — Encounter (HOSPITAL_COMMUNITY): Payer: Self-pay | Admitting: Emergency Medicine

## 2021-08-29 ENCOUNTER — Emergency Department (HOSPITAL_COMMUNITY)
Admission: EM | Admit: 2021-08-29 | Discharge: 2021-08-29 | Discharge status: Against Medical Advice | Payer: 59 | Attending: Emergency Medicine | Admitting: Emergency Medicine

## 2021-08-29 ENCOUNTER — Other Ambulatory Visit: Payer: Self-pay

## 2021-08-29 DIAGNOSIS — R531 Weakness: Secondary | ICD-10-CM | POA: Insufficient documentation

## 2021-08-29 DIAGNOSIS — R42 Dizziness and giddiness: Secondary | ICD-10-CM | POA: Insufficient documentation

## 2021-08-29 DIAGNOSIS — J449 Chronic obstructive pulmonary disease, unspecified: Secondary | ICD-10-CM | POA: Insufficient documentation

## 2021-08-29 DIAGNOSIS — I5022 Chronic systolic (congestive) heart failure: Secondary | ICD-10-CM | POA: Insufficient documentation

## 2021-08-29 DIAGNOSIS — H538 Other visual disturbances: Secondary | ICD-10-CM | POA: Insufficient documentation

## 2021-08-29 DIAGNOSIS — Z7982 Long term (current) use of aspirin: Secondary | ICD-10-CM | POA: Insufficient documentation

## 2021-08-29 DIAGNOSIS — F17210 Nicotine dependence, cigarettes, uncomplicated: Secondary | ICD-10-CM | POA: Insufficient documentation

## 2021-08-29 DIAGNOSIS — I110 Hypertensive heart disease with heart failure: Secondary | ICD-10-CM | POA: Insufficient documentation

## 2021-08-29 DIAGNOSIS — R002 Palpitations: Secondary | ICD-10-CM | POA: Insufficient documentation

## 2021-08-29 DIAGNOSIS — Z7951 Long term (current) use of inhaled steroids: Secondary | ICD-10-CM | POA: Insufficient documentation

## 2021-08-29 DIAGNOSIS — Z21 Asymptomatic human immunodeficiency virus [HIV] infection status: Secondary | ICD-10-CM | POA: Diagnosis not present

## 2021-08-29 DIAGNOSIS — R55 Syncope and collapse: Secondary | ICD-10-CM

## 2021-08-29 DIAGNOSIS — Z79899 Other long term (current) drug therapy: Secondary | ICD-10-CM | POA: Insufficient documentation

## 2021-08-29 NOTE — ED Notes (Signed)
Pt was refusing to say and did not want to sign AMA form.  Pt walked out dressed in his clothing. RN Cherly Beach notifying primary RN and ED provider

## 2021-08-29 NOTE — ED Provider Notes (Signed)
[facility] EMERGENCY DEPARTMENT Provider Note   CSN: [id number] Arrival date & time: 08/29/21  6063     History Chief Complaint  Patient presents with   Dizziness    Ryan Long is a 60 y.o. male who presents with dizziness. He was at the heart failure clinic today when he had onset of light headedness, weakness and tunnel vision. He states: " I feel like I am going to fall out." He has been having these symptoms daily for the past several weeks. His ICD fired on Aug 25, but he did not seek evaluation at that time. He notes associated palpitations. He denies vertigo.    Dizziness     Past Medical History:  Diagnosis Date   Active smoker    AICD (automatic cardioverter/defibrillator) present    Alcohol abuse    Anxiety    AR (allergic rhinitis)    Bipolar 1 disorder (HCC)    Cervical lymphadenitis 04/20/2021   CHF (congestive heart failure) (HCC)    Chronic bronchitis (HCC)    Chronic systolic heart failure (HCC)    Controlled substance agreement signed 10/22/2018   COPD (chronic obstructive pulmonary disease) (HCC)    Cough 12/31/2018   Crack cocaine use    Depression    Genital herpes    HIV (human immunodeficiency virus infection) (HCC) dx'd 08/1993   HLD (hyperlipidemia)    Hypertension    NICM (nonischemic cardiomyopathy) (HCC)    Echocardiogram 06/28/11: EF 30-35%, mild MR, mild LAE;  No CAD by coronary CT angiogram 3/12 at [facility]   NSVT (nonsustained ventricular tachycardia) (HCC)    PTSD (post-traumatic stress disorder)     Patient Active Problem List   Diagnosis Date Noted   Cervical lymphadenitis 04/20/2021   Hip pain 01/06/2021   VT (ventricular tachycardia) (HCC) 08/25/2020   HIV positive (HCC) 07/12/2020   Atrial fibrillation (HCC) 06/10/2020   BPH (benign prostatic hyperplasia) 01/28/2020   HFrEF (heart failure with reduced ejection fraction) (HCC) 01/28/2020   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    AICD (automatic cardioverter/defibrillator) present 08/15/2018   Abnormal EKG 06/28/2018   Adjustment disorder with mixed disturbance of emotions and conduct    NICM (nonischemic cardiomyopathy) (HCC) 01/08/2018   Anxiety 12/20/2017   Bipolar 1 disorder (HCC) 10/10/2017   Chronic systolic heart failure (HCC) 05/13/2017   HSV-2 infection 07/17/2016   History of attempted suicide 04/14/2016   History of alcoholism (HCC) 04/14/2016   History of substance abuse (HCC) 04/14/2016   Constipation 01/17/2016   Chronic obstructive pulmonary disease (HCC) 10/04/2015   Abnormal thyroid stimulating hormone (TSH) level 10/04/2015   MDD (major depressive disorder), recurrent episode, severe (HCC) 09/23/2015   Hypertension 09/10/2015   PTSD (post-traumatic stress disorder) 08/07/2015   Primary insomnia 04/02/2015   Herpesviral infection of penis 04/02/2015   Generalized anxiety disorder 05/29/2014   Tobacco use disorder 02/17/2013   Heart failure (HCC) 09/19/2011   Other primary cardiomyopathies 08/24/2011   Human immunodeficiency virus (HIV) disease (HCC) 02/01/2009   Recurrent HSV (herpes simplex virus) 02/01/2009   Hyperlipidemia 02/01/2009   Allergic rhinitis 02/01/2009    Past Surgical History:  Procedure Laterality Date   CARDIAC DEFIBRILLATOR PLACEMENT  01/08/2018   ICD IMPLANT N/A 01/08/2018   Procedure: ICD IMPLANT;  Surgeon: Regan Lemming, MD;  [facility];  Service: Cardiovascular;  Laterality: N/A;       Family History  Problem Relation Age of Onset   Glaucoma Mother  Mental illness Mother    Vision loss Mother    Hypertension Father    CAD Father    Mental illness Father    Alcohol abuse Father    Drug abuse Father    Heart attack Father 79   Early death Father    Alcohol abuse Brother    Drug abuse Brother    Drug abuse Brother     Social History   Tobacco Use   Smoking status: Every Day    Packs/day: 0.75    Years: 38.00    Pack  years: 28.50    Types: Cigarettes   Smokeless tobacco: Never   Tobacco comments:    hoping welbutrin will help  Vaping Use   Vaping Use: Never used  Substance Use Topics   Alcohol use: Not Currently    Alcohol/week: 6.0 standard drinks    Types: 6 Cans of beer per week    Comment: 01/08/2018 "stopped 06/2017"   Drug use: Not Currently    Types: Cocaine    Comment: 01/08/2018 "stopped 06/2017"    Home Medications Prior to Admission medications   Medication Sig Start Date End Date Taking? Authorizing Provider  aspirin EC 81 MG tablet Take 81 mg by mouth daily. Swallow whole.    [provider]  bictegravir-emtricitabine-tenofovir AF (BIKTARVY) 50-200-25 MG TABS tablet Take 1 tablet by mouth daily. 06/09/21   Randall Hiss, MD  eplerenone (INSPRA) 25 MG tablet TAKE 1/2 TABLET BY MOUTH DAILY. 10/12/20   Bensimhon, Bevelyn Buckles, MD  fluticasone furoate-vilanterol (BREO ELLIPTA) 100-25 MCG/INH AEPB Inhale 1 puff into the lungs daily. 03/22/21   Storm Frisk, MD  hydrOXYzine (ATARAX/VISTARIL) 25 MG tablet TAKE 1-2 TABLETS BY MOUTH 2 TIMES DAILY AS NEEDED FOR ANXIETY. 07/21/21   Laurey Morale, MD  Melatonin 10 MG CAPS Take by mouth at bedtime.    [provider]  metoprolol succinate (TOPROL-XL) 25 MG 24 hr tablet Take 1 tablet (25 mg total) by mouth daily. For high blood pressure 05/23/21   Storm Frisk, MD  Multiple Vitamins-Minerals (MENS MULTIPLUS PO) Take by mouth.    [provider]  nicotine polacrilex (NICORETTE MINI) 4 MG lozenge Take one three times daily to stop smoking 03/22/21   Storm Frisk, MD  sacubitril-valsartan (ENTRESTO) 49-51 MG Take 1 tablet by mouth 2 (two) times daily. Patient taking differently: Take 0.5 tablets by mouth 2 (two) times daily. 04/20/21   Bensimhon, Bevelyn Buckles, MD  tamsulosin (FLOMAX) 0.4 MG CAPS capsule TAKE 2 CAPSULES(0.8MG  TOTAL) BY MOUTH EVERY DAY AFTER SUPPER FOR PROSTATE HEALTH 05/06/21   Storm Frisk, MD   traZODone (DESYREL) 100 MG tablet Take 1 tablet (100 mg total) by mouth at bedtime as needed for sleep. 05/23/21   Storm Frisk, MD  valACYclovir (VALTREX) 500 MG tablet TAKE 1 TABLET BY MOUTH EVERY DAY FOR HERPES 05/23/21   Storm Frisk, MD    Allergies    Patient has no known allergies.  Review of Systems   Review of Systems  Neurological:  Positive for dizziness.  Unable to obtain full review of sxs as patient is demanding to leave. Physical Exam Updated Vital Signs BP 109/69 (BP Location: Right Arm)   Pulse 95   Temp 98.4 F (36.9 C) (Temporal)   Resp 20   Ht 5\' 9"  (1.753 m)   Wt 91.3 kg   SpO2 100%   BMI 29.72 kg/m   Physical Exam Unable to obtain physical  examination as patient is actively dressing and refusing examination ED Results / Procedures / Treatments   Labs (all labs ordered are listed, but only abnormal results are displayed) Labs Reviewed - No data to display  EKG None  Radiology No results found.  Procedures Procedures   Medications Ordered in ED Medications - No data to display  ED Course  I have reviewed the triage vital signs and the nursing notes.  Pertinent labs & imaging results that were available during my care of the patient were reviewed by me and considered in my medical decision making (see chart for details).    MDM Rules/Calculators/A&P                           Patient appears irritated. He states that he does not want to be seen in trauma a room because "there is no TV or no food in here."  Patient also states "I have insurance."  I explained to the patient that we got him into the first available room because he has symptoms very concerning for potential arrhythmia.  I discussed with the patient that he could be having life-threatening arrhythmias and potentially needs some adjustment to his defibrillator given his history of heart failure with an EF of less than 25%.  The patient understands fully that this could be a  life-threatening issue leading to potential severe morbidity or mortality.  He refuses any further services and will leave AGAINST MEDICAL ADVICE.Date: 08/29/2021 Patient: Ryan Long Admitted: 08/29/2021  9:39 AM Attending Provider: Gwyneth Sprout, MD  Ryan Long or his authorized caregiver has made the decision for the patient to leave the emergency department against the advice of [facility]   He or his authorized caregiver has been informed and understands the inherent risks, including death.  He or his authorized caregiver has decided to accept the responsibility for this decision. Neysa Bonito Troxler and all necessary parties have been advised that he may return for further evaluation or treatment. His condition at time of discharge was Stable.  Neysa Bonito Emmer had current vital signs as follows:  Blood pressure 109/69, pulse 95, temperature 98.4 F (36.9 C), temperature source Temporal, resp. rate 20, height 5\' 9"  (1.753 m), weight 91.3 kg, SpO2 100 %.   Galbreath or his authorized caregiver has not signed the Leaving Against Medical Advice form prior to leaving the department.  Neysa Bonito 08/29/2021    Final Clinical Impression(s) / ED Diagnoses Final diagnoses:  None    Rx / DC Orders ED Discharge Orders     None        10/29/2021, PA-C 08/29/21 1032    10/29/21, MD 08/29/21 1150

## 2021-08-29 NOTE — ED Notes (Signed)
Patient wanting to leave. Notified Abigail PA. Pt states, "I don't have any TV, I don't have any food, I don't want to stay". Abigail,PA at bedside to talk with pt but pt still persistent on wanting to leave. Pt dressed himself and ambulatory and left before any AMA paperwork could be obtained.

## 2021-08-29 NOTE — ED Triage Notes (Signed)
Pt BIB staff from heart failure clinic here at [facility]. Patient was setting up appointments and was standing up and felt dizzy and felt as though he was going to pass out. Pt verbalized recent shock from ICD on 25th on August. Pt VSS. Aox4. Ambulatory in room.

## 2021-08-30 ENCOUNTER — Telehealth: Payer: Self-pay | Admitting: Cardiology

## 2021-08-30 MED ORDER — NICOTINE 7 MG/24HR TD PT24
7.0000 mg | MEDICATED_PATCH | Freq: Every day | TRANSDERMAL | 0 refills | Status: DC
Start: 2021-08-30 — End: 2021-09-12

## 2021-08-30 NOTE — Telephone Encounter (Signed)
This pt was in ED for poss Stroke and left AMA before work up completed.  He needs an OV with me face to face double book

## 2021-08-30 NOTE — Telephone Encounter (Signed)
  1. Has your device fired? States his device shocked him   2. Is you device beeping? no  3. Are you experiencing draining or swelling at device site? no  4. Are you calling to see if we received your device transmission? no  5. Have you passed out? no   Patient states he was shocked by his defibrillator. Offered patient an appointment tomorrow and Monday with a PA, but patient declined. He stated he was not sure if he could get transportation in time for tomorrow.   Please route to Device Clinic Pool

## 2021-08-30 NOTE — Telephone Encounter (Signed)
Successful telephone encounter to patient to follow up on report of shock by defibrillator. Per patient he was shocked x 1 on August 25. Of not patient seen in ED yesterday for stroke like symptoms and left AMA. Also states he discontinued his entresto "I threw it away" and has resumed an old script of lasix that is not on his med profile. During conversation patient states he has an appointment with EP at [facility] on Thursday 09/01/21. Then patient begins to complain about yesterdays ED visit and asking why he was not told he needed to stay. It is documented that patient left AMA. Patient does not participate in device remote monitoring with [facility]. Last visit with Dr. Elberta Fortis 10/05/20. Remotes previously followed by [facility] and patient most recently seen at [facility] by Dr. Cloria Spring 07/12/21. Of not patient refused appointment with PA during earlier call today. Patient is encouraged to seek care with a single provider for best patient outcomes. States he will keep appointment with [facility].

## 2021-09-05 NOTE — Progress Notes (Signed)
Electrophysiology Office Note Date: 09/06/2021  ID:  Ryan Long, DOB 1961/06/21, MRN [id number]  PCP: Storm Frisk, MD Primary Cardiologist: None Electrophysiologist: Will [name], MD   CC: Routine ICD follow-up  Ryan Long is a 60 y.o. male seen today for Will [name], MD for routine electrophysiology followup.  Since last being seen in our clinic the patient reports doing OK overall apart from last month. He was walking into his home after parking and felt stiffness in his left arm and lightheadedness, then he felt his Device shock him. Denies syncope. He denies drug use or acute illness at the time. He is not sure how he is taking his medicine. He stated on the phone he was off Entresto and "threw away his lasix" but today states he is cutting lasix in half. Seen in ED earlier this month for recurrent symptoms of his arm stiffening, but left AMA prior to work up. He denies overt chest pain, was overall "feeling well" the day of his shock. Not entirely sure if he had palpitations or just dizzy.   Device History: St. Jude Single Chamber ICD implanted 2019 for CHF  Past Medical History:  Diagnosis Date   Active smoker    AICD (automatic cardioverter/defibrillator) present    Alcohol abuse    Anxiety    AR (allergic rhinitis)    Bipolar 1 disorder (HCC)    Cervical lymphadenitis 04/20/2021   CHF (congestive heart failure) (HCC)    Chronic bronchitis (HCC)    Chronic systolic heart failure (HCC)    Controlled substance agreement signed 10/22/2018   COPD (chronic obstructive pulmonary disease) (HCC)    Cough 12/31/2018   Crack cocaine use    Depression    Genital herpes    HIV (human immunodeficiency virus infection) (HCC) dx'd 08/1993   HLD (hyperlipidemia)    Hypertension    NICM (nonischemic cardiomyopathy) (HCC)    Echocardiogram 06/28/11: EF 30-35%, mild MR, mild LAE;  No CAD by coronary CT [facility] 3/12 [facility]    NSVT (nonsustained ventricular tachycardia) (HCC)    PTSD (post-traumatic stress disorder)    Past Surgical History:  Procedure Laterality Date   CARDIAC DEFIBRILLATOR PLACEMENT  01/08/2018   ICD IMPLANT N/A 01/08/2018   Procedure: ICD IMPLANT;  Surgeon: Regan Lemming, MD;  Location: [facility] INV[facility];  Service: Cardiovascular;  Laterality: N/A;    Current Outpatient Medications  Medication Sig Dispense Refill   aspirin EC 81 MG tablet Take 81 mg by mouth daily. Swallow whole.     bictegravir-emtricitabine-tenofovir AF (BIKTARVY) 50-200-25 MG TABS tablet Take 1 tablet by mouth daily. 30 tablet 5   eplerenone (INSPRA) 25 MG tablet TAKE 1/2 TABLET BY MOUTH DAILY. 15 tablet 11   fluticasone furoate-vilanterol (BREO ELLIPTA) 100-25 MCG/INH AEPB Inhale 1 puff into the lungs daily. 60 each 4   hydrOXYzine (ATARAX/VISTARIL) 25 MG tablet TAKE 1-2 TABLETS BY MOUTH 2 TIMES DAILY AS NEEDED FOR ANXIETY. 60 tablet 3   Melatonin 10 MG CAPS Take by mouth at bedtime.     metoprolol succinate (TOPROL-XL) 25 MG 24 hr tablet Take 1 tablet (25 mg total) by mouth daily. For high blood pressure 90 tablet 2   Multiple Vitamins-Minerals (MENS MULTIPLUS PO) Take by mouth.     nicotine (NICOTINE STEP 3) 7 mg/24hr patch Place 1 patch (7 mg total) onto the skin daily. 28 patch 0   sacubitril-valsartan (ENTRESTO) 49-51 MG Take 1 tablet by mouth  2 (two) times daily. (Patient taking differently: Take 0.5 tablets by mouth 2 (two) times daily.) 60 tablet 11   sildenafil (VIAGRA) 50 MG tablet Take 50 mg by mouth as needed.     tamsulosin (FLOMAX) 0.4 MG CAPS capsule TAKE 2 CAPSULES(0.8MG  TOTAL) BY MOUTH EVERY DAY AFTER SUPPER FOR PROSTATE HEALTH 60 capsule 2   traZODone (DESYREL) 100 MG tablet Take 1 tablet (100 mg total) by mouth at bedtime as needed for sleep. 30 tablet 3   valACYclovir (VALTREX) 500 MG tablet TAKE 1 TABLET BY MOUTH EVERY DAY FOR HERPES 90 tablet 0   No current facility-administered medications  for this visit.    Allergies:   Patient has no known allergies.   Social History: Social History   Socioeconomic History   Marital status: Single    Spouse name: Not on file   Number of children: Not on file   Years of education: Not on file   Highest education level: Not on file  Occupational History   Not on file  Tobacco Use   Smoking status: Every Day    Packs/day: 0.75    Years: 38.00    Pack years: 28.50    Types: Cigarettes   Smokeless tobacco: Never   Tobacco comments:    hoping welbutrin will help  Vaping Use   Vaping Use: Never used  Substance and Sexual Activity   Alcohol use: Not Currently    Alcohol/week: 6.0 standard drinks    Types: 6 Cans of beer per week    Comment: 01/08/2018 "stopped 06/2017"   Drug use: Not Currently    Types: Cocaine    Comment: 01/08/2018 "stopped 06/2017"   Sexual activity: Yes    Birth control/protection: Condom    Comment: pt. declined condoms  Other Topics Concern   Not on file  Social History Narrative   Not on file   Social Determinants of Health   Financial Resource Strain: Not on file  Food Insecurity: Not on file  Transportation Needs: Not on file  Physical Activity: Not on file  Stress: Not on file  Social Connections: Not on file  Intimate Partner Violence: Not on file    Family History: Family History  Problem Relation Age of Onset   Glaucoma Mother    Mental illness Mother    Vision loss Mother    Hypertension Father    CAD Father    Mental illness Father    Alcohol abuse Father    Drug abuse Father    Heart attack Father 35   Early death Father    Alcohol abuse Brother    Drug abuse Brother    Drug abuse Brother     Review of Systems: All other systems reviewed and are otherwise negative except as noted above.   Physical Exam: Vitals:   09/06/21 0858  BP: 100/62  Pulse: 98  SpO2: 96%  Weight: 211 lb 9.6 oz (96 kg)  Height: 5\' 9"  (1.753 m)     GEN- The patient is well appearing, alert  and oriented x 3 today.   HEENT: normocephalic, atraumatic; sclera clear, conjunctiva pink; hearing intact; oropharynx clear; neck supple, no JVP Lymph- no cervical lymphadenopathy Lungs- Clear to ausculation bilaterally, normal work of breathing.  No wheezes, rales, rhonchi Heart- Regular rate and rhythm, no murmurs, rubs or gallops, PMI not laterally displaced GI- soft, non-tender, non-distended, bowel sounds present, no hepatosplenomegaly Extremities- no clubbing or cyanosis. No edema; DP/PT/radial pulses 2+ bilaterally MS- no significant deformity  or atrophy Skin- warm and dry, no rash or lesion; ICD pocket well healed Psych- euthymic mood, full affect Neuro- strength and sensation are intact  ICD interrogation- reviewed in detail today,  See PACE[facility] report  EKG:  EKG is not ordered today.  Recent Labs: 04/20/2021: ALT 19; BUN 12; Creat 1.17; Hemoglobin 17.6; Platelets 139; Potassium 4.0; Sodium 143   Wt Readings from Last 3 Encounters:  09/06/21 211 lb 9.6 oz (96 kg)  08/29/21 201 lb 4.5 oz (91.3 kg)  05/23/21 205 lb 3.2 oz (93.1 kg)     Other studies Reviewed: Additional studies/ records that were reviewed today include: Previous EP office notes.   Assessment and Plan:  1.  Chronic systolic dysfunction s/p St. Jude single chamber ICD  euvolemic today Stable on an appropriate medical regimen, though he is not sure which medicines he has or does not have. Encouraged bringing of medications to future visits.  Normal ICD function See [facility] report No changes today He is cutting his Entresto in half. Will send for 24/26 dose.  He is pending CPX later this month.   2. HIV Follows with ID  3. Tobacco abuse Encouraged complete cessation  4. Cocaine abuse Encouraged complete cessation. He states that he is abstinent.   5. Recent VT Suspect at least somewhat in setting of non-compliance. He denies drug uses. Large number of NSVT events clustered on 8/25 suspicious for  some type of stimulus rather it be illness or substance abuse. As above, he denies either.  He was ordered for an exercise stress echo at [facility] per notes 07/12/21. Encouraged to follow up. Labs today  Increase toprol to 50 mg daily.  EGMs are irregular leading up to his events, but he has no known history of AF and no other clear episode. Will continue to follow closely.  He is not sure if his remotes are going to [facility] or [facility] but request they now come here. Have forwarded request to device clinic.  Reviewed [place] guidelines of no driving x 6 months. Pt states he "doesn't drive anyway."  Current medicines are reviewed at length with the patient today.    Labs/ tests ordered today include:  Orders Placed This Encounter  Procedures   Comprehensive metabolic panel   TSH   CBC   Magnesium   Pro b natriuretic peptide (BNP)    Disposition:   Follow up with Dr. Elberta Fortis  as scheduled.    Dustin Flock, PA-C  09/06/2021 9:18 AM  [facility] [address] [place] [address] [phone] (office) [phone] (fax)

## 2021-09-06 ENCOUNTER — Ambulatory Visit (INDEPENDENT_AMBULATORY_CARE_PROVIDER_SITE_OTHER): Payer: 59 | Admitting: Student

## 2021-09-06 ENCOUNTER — Other Ambulatory Visit: Payer: Self-pay

## 2021-09-06 ENCOUNTER — Encounter: Payer: Self-pay | Admitting: Student

## 2021-09-06 VITALS — BP 100/62 | HR 98 | Ht 69.0 in | Wt 211.6 lb

## 2021-09-06 DIAGNOSIS — Z720 Tobacco use: Secondary | ICD-10-CM | POA: Diagnosis not present

## 2021-09-06 DIAGNOSIS — I472 Ventricular tachycardia, unspecified: Secondary | ICD-10-CM

## 2021-09-06 DIAGNOSIS — R5383 Other fatigue: Secondary | ICD-10-CM

## 2021-09-06 DIAGNOSIS — I428 Other cardiomyopathies: Secondary | ICD-10-CM | POA: Diagnosis not present

## 2021-09-06 DIAGNOSIS — I5022 Chronic systolic (congestive) heart failure: Secondary | ICD-10-CM | POA: Diagnosis not present

## 2021-09-06 DIAGNOSIS — F1490 Cocaine use, unspecified, uncomplicated: Secondary | ICD-10-CM | POA: Diagnosis not present

## 2021-09-06 LAB — CUP PACEART INCLINIC DEVICE CHECK
Battery Remaining Longevity: 63 mo
Brady Statistic RV Percent Paced: 0.01 %
Date Time Interrogation Session: 20220920093902
HighPow Impedance: 63 Ohm
Implantable Lead Implant Date: 20190122
Implantable Lead Location: 753860
Implantable Pulse Generator Implant Date: 20190122
Lead Channel Impedance Value: 400 Ohm
Lead Channel Pacing Threshold Amplitude: 0.75 V
Lead Channel Pacing Threshold Amplitude: 0.75 V
Lead Channel Pacing Threshold Pulse Width: 0.5 ms
Lead Channel Pacing Threshold Pulse Width: 0.5 ms
Lead Channel Sensing Intrinsic Amplitude: 8 mV
Lead Channel Setting Pacing Amplitude: 2 V
Lead Channel Setting Pacing Pulse Width: 0.5 ms
Lead Channel Setting Sensing Sensitivity: 0.5 mV
Pulse Gen Serial Number: 7376107

## 2021-09-06 MED ORDER — ENTRESTO 24-26 MG PO TABS: 1.0000 | ORAL_TABLET | Freq: Two times a day (BID) | ORAL | 3 refills | Status: DC

## 2021-09-06 MED ORDER — METOPROLOL SUCCINATE ER 50 MG PO TB24: 50.0000 mg | ORAL_TABLET | Freq: Every day | ORAL | 3 refills | Status: DC

## 2021-09-06 NOTE — Patient Instructions (Signed)
Medication Instructions:  Your physician has recommended you make the following change in your medication:   DECREASE: Entresto to 24-26mg  twice daily INCREASE: Metoprolol to 50mg  daily at bedtime  *If you need a refill on your cardiac medications before your next appointment, please call your pharmacy*   Lab Work: TODAY: CMET, CBC, Mg, TSH, BNP  If you have labs (blood work) drawn today and your tests are completely normal, you will receive your results only by: MyChart Message (if you have MyChart) OR A paper copy in the mail If you have any lab test that is abnormal or we need to change your treatment, we will call you to review the results.  Follow-Up: At [facility], you and your health needs are our priority.  As part of our continuing mission to provide you with exceptional heart care, we have created designated Provider Care Teams.  These Care Teams include your primary Cardiologist (physician) and Advanced Practice Providers (APPs -  Physician Assistants and Nurse Practitioners) who all work together to provide you with the care you need, when you need it.   Your next appointment:   As scheduled

## 2021-09-07 ENCOUNTER — Other Ambulatory Visit: Payer: Self-pay

## 2021-09-07 DIAGNOSIS — I5022 Chronic systolic (congestive) heart failure: Secondary | ICD-10-CM

## 2021-09-07 DIAGNOSIS — I472 Ventricular tachycardia, unspecified: Secondary | ICD-10-CM

## 2021-09-07 LAB — CBC
Hematocrit: 44.6 % (ref 37.5–51.0)
Hemoglobin: 16.3 g/dL (ref 13.0–17.7)
MCH: 34 pg — ABNORMAL HIGH (ref 26.6–33.0)
MCHC: 36.5 g/dL — ABNORMAL HIGH (ref 31.5–35.7)
MCV: 93 fL (ref 79–97)
Platelets: 122 10*3/uL — ABNORMAL LOW (ref 150–450)
RBC: 4.8 x10E6/uL (ref 4.14–5.80)
RDW: 12.1 % (ref 11.6–15.4)
WBC: 2 10*3/uL — CL (ref 3.4–10.8)

## 2021-09-07 LAB — COMPREHENSIVE METABOLIC PANEL
ALT: 21 IU/L (ref 0–44)
AST: 25 IU/L (ref 0–40)
Albumin/Globulin Ratio: 2.6 — ABNORMAL HIGH (ref 1.2–2.2)
Albumin: 3.9 g/dL (ref 3.8–4.9)
Alkaline Phosphatase: 50 IU/L (ref 44–121)
BUN/Creatinine Ratio: 8 — ABNORMAL LOW (ref 10–24)
BUN: 9 mg/dL (ref 8–27)
Bilirubin Total: 0.6 mg/dL (ref 0.0–1.2)
CO2: 25 mmol/L (ref 20–29)
Calcium: 8.7 mg/dL (ref 8.6–10.2)
Chloride: 107 mmol/L — ABNORMAL HIGH (ref 96–106)
Creatinine, Ser: 1.1 mg/dL (ref 0.76–1.27)
Globulin, Total: 1.5 g/dL (ref 1.5–4.5)
Glucose: 95 mg/dL (ref 65–99)
Potassium: 3.6 mmol/L (ref 3.5–5.2)
Sodium: 142 mmol/L (ref 134–144)
Total Protein: 5.4 g/dL — ABNORMAL LOW (ref 6.0–8.5)
eGFR: 77 mL/min/{1.73_m2} (ref >59)

## 2021-09-07 LAB — MAGNESIUM: Magnesium: 2 mg/dL (ref 1.6–2.3)

## 2021-09-07 LAB — TSH: TSH: 0.217 u[IU]/mL — ABNORMAL LOW (ref 0.450–4.500)

## 2021-09-07 LAB — PRO B NATRIURETIC PEPTIDE: NT-Pro BNP: 755 pg/mL — ABNORMAL HIGH (ref 0–210)

## 2021-09-07 MED ORDER — POTASSIUM CHLORIDE CRYS ER 20 MEQ PO TBCR: 20.0000 meq | EXTENDED_RELEASE_TABLET | Freq: Every day | ORAL | 3 refills | Status: DC

## 2021-09-08 ENCOUNTER — Telehealth: Payer: Self-pay

## 2021-09-08 ENCOUNTER — Ambulatory Visit: Payer: 59 | Admitting: Critical Care Medicine

## 2021-09-08 ENCOUNTER — Ambulatory Visit: Payer: Self-pay | Admitting: *Deleted

## 2021-09-08 NOTE — Telephone Encounter (Signed)
I called [facility] to have the patient released.

## 2021-09-08 NOTE — Telephone Encounter (Signed)
Reason for Disposition  [1] SEVERE pain AND [2] age > 60 years  Answer Assessment - Initial Assessment Questions 1. LOCATION: "Where does it hurt?"      midline 2. RADIATION: "Does the pain shoot anywhere else?" (e.g., chest, back)     no 3. ONSET: "When did the pain begin?" (Minutes, hours or days ago)      yesterday 4. SUDDEN: "Gradual or sudden onset?"     sudden 5. PATTERN "Does the pain come and go, or is it constant?"    - If constant: "Is it getting better, staying the same, or worsening?"      (Note: Constant means the pain never goes away completely; most serious pain is constant and it progresses)     - If intermittent: "How long does it last?" "Do you have pain now?"     (Note: Intermittent means the pain goes away completely between bouts)     constant 6. SEVERITY: "How bad is the pain?"  (e.g., Scale 1-10; mild, moderate, or severe)    - MILD (1-3): doesn't interfere with normal activities, abdomen soft and not tender to touch     - MODERATE (4-7): interferes with normal activities or awakens from sleep, abdomen tender to touch     - SEVERE (8-10): excruciating pain, doubled over, unable to do any normal activities       Severe- not eating,drinking 7. RECURRENT SYMPTOM: "Have you ever had this type of stomach pain before?" If Yes, ask: "When was the last time?" and "What happened that time?"      Yes- not this bad 8. CAUSE: "What do you think is causing the stomach pain?"     Unsure 9. RELIEVING/AGGRAVATING FACTORS: "What makes it better or worse?" (e.g., movement, antacids, bowel movement)     No-resting 10. OTHER SYMPTOMS: "Do you have any other symptoms?" (e.g., back pain, diarrhea, fever, urination pain, vomiting)       Vomiting, pain with eating  Protocols used: Abdominal Pain - Male-A-AH

## 2021-09-08 NOTE — Telephone Encounter (Signed)
-----   Message from Linton Ham, RN sent at 09/06/2021  5:33 PM EDT ----- I have put the transfer request into [facility], pt will need to be scheduled for future checks once this is processed.    ----- Message ----- From: Graciella Freer, PA-C Sent: 09/06/2021   9:17 AM EDT To: [facility]  Pt requests his device reports come here. He is not sure if they are going to [place] or [place].

## 2021-09-08 NOTE — Telephone Encounter (Signed)
Patient is calling to report he has severe abdominal pain since yesterday. Patient had pain with eating/drinking and is vomiting. Patient states he thinks it is related to vaccine- but vaccine was given Sunday. Patient advised for severe pain- he needs to been evaluated- advised ED- unsure if patient will go.

## 2021-09-09 NOTE — Telephone Encounter (Signed)
Patient states he is feeling much better. He has had some cramps and will continue to hydrate. Has an appointment in office on Monday.

## 2021-09-11 NOTE — Progress Notes (Signed)
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   PLEURA: No pleural thickening or effusion. No pneumothorax.   HEART: Heart size normal. No pericardial effusion. Single-lead left chest wall cardiac rhythm maintenance device with lead terminating within the right ventricle.   CORONARY ARTERY CALCIFICATION: None.   MEDIASTINUM/HILUM/AXILLA: No adenopathy.   OTHER FINDINGS: None. Exam End: 08/26/21 09:56   Echo 04/2021 1. Left ventricular ejection fraction, by estimation, is 20 to 25%. The left ventricle has severely decreased function. The left ventricle has no regional wall motion abnormalities. The left ventricular internal cavity size was mildly to moderately dilated. Left ventricular diastolic parameters are consistent with Grade I diastolic dysfunction (impaired relaxation). 2. Right ventricular systolic function is normal. The right  ventricular size is normal. 3. The mitral valve is normal in structure. Mild mitral valve regurgitation. No evidence of mitral stenosis. 4. The aortic valve is normal in structure. Aortic valve regurgitation is not visualized. No aortic stenosis is present. 5. The inferior vena cava is normal in size with greater than 50% respiratory variability, suggesting right atrial pressure of 3 mmHg. Left Ventricle: Left ventricular ejection fraction, by estimation, is 20 to 25%. T  Assessment & Plan:   Problem List Items Addressed This Visit       Cardiovascular and Mediastinum   Chronic systolic heart failure (HCC)    Chronic systolic heart failure nonischemic cardiomyopathy cocaine and alcohol related  Again recommended patient abstain from alcohol and tobacco products  Keep appointment with upcoming stress test  Patient says he is not using cocaine currently  No change in medications refills issued  Check metabolic panel      Relevant Medications   eplerenone (INSPRA) 25 MG tablet   Hypertension    No change in medications blood pressure controlled      Relevant Medications   eplerenone (INSPRA) 25 MG tablet   VT (ventricular tachycardia) (HCC)    Per cardiology      Relevant Medications   eplerenone (INSPRA) 25 MG tablet     Respiratory   Chronic obstructive pulmonary disease (HCC)    COPD with acute flare  Pulse prednisone and give azithromycin for 5 days continue Breo inhaler      Relevant Medications   fluticasone furoate-vilanterol (BREO ELLIPTA) 100-25 MCG/INH AEPB   nicotine (NICOTINE STEP 3) 7 mg/24hr  patch   azithromycin (ZITHROMAX) 250 MG tablet   predniSONE (DELTASONE) 10 MG tablet     Genitourinary   BPH (benign prostatic hyperplasia)    Continue Flomax      Relevant Medications   tamsulosin (FLOMAX) 0.4 MG CAPS capsule     Other   PTSD (post-traumatic stress disorder)    Referral to licensed clinical social worker made      Relevant  Medications   hydrOXYzine (ATARAX/VISTARIL) 25 MG tablet   traZODone (DESYREL) 100 MG tablet   Tobacco use disorder       Current smoking consumption amount: 1 pack every day  Dicsussion on advise to quit smoking and smoking impacts: Cardiovascular lung impacts  Patient's willingness to quit: Willing to quit  Methods to quit smoking discussed: Behavioral modification  Medication management of smoking session drugs discussed: on wellbutrin   Resources provided:  AVS   Setting quit date not established  Follow-up arranged 4 month   Time spent counseling the patient: 5 minutes        HIV positive ([place])    [facility]      Relevant Medications   valACYclovir (VALTREX) 500 MG tablet   Other Visit Diagnoses     Hypokalemia    -  Primary   Relevant Orders   Basic Metabolic Panel   HSV infection       Relevant Medications   valACYclovir (VALTREX) 500 MG tablet   azithromycin (ZITHROMAX) 250 MG tablet       Meds ordered this encounter  Medications   eplerenone (INSPRA) 25 MG tablet    Sig: Take 0.5 tablets (12.5 mg total) by mouth daily.    Dispense:  15 tablet    Refill:  11   fluticasone furoate-vilanterol (BREO ELLIPTA) 100-25 MCG/INH AEPB    Sig: Inhale 1 puff into the lungs daily.    Dispense:  60 each    Refill:  4   hydrOXYzine (ATARAX/VISTARIL) 25 MG tablet    Sig: TAKE 1-2 TABLETS BY MOUTH 2 TIMES DAILY AS NEEDED FOR ANXIETY.    Dispense:  60 tablet    Refill:  3   tamsulosin (FLOMAX) 0.4 MG CAPS capsule    Sig: TAKE 2 CAPSULES(0.8MG TOTAL) BY MOUTH EVERY DAY AFTER SUPPER FOR PROSTATE HEALTH    Dispense:  60 capsule    Refill:  2   traZODone (DESYREL) 100 MG tablet    Sig: Take 1 tablet (100 mg total) by mouth at bedtime as needed for sleep.    Dispense:  30 tablet    Refill:  3   valACYclovir (VALTREX) 500 MG tablet    Sig: TAKE 1 TABLET BY MOUTH EVERY DAY FOR HERPES    Dispense:  90 tablet    Refill:  4    nicotine (NICOTINE STEP 3) 7 mg/24hr patch    Sig: Place 1 patch (7 mg total) onto the skin daily.    Dispense:  28 patch    Refill:  0   azithromycin (ZITHROMAX) 250 MG tablet    Sig: Take two once then one daily until gone    Dispense:  6 tablet    Refill:  0   predniSONE (DELTASONE) 10 MG tablet    Sig: Take 4 tablets daily for 5 days then stop    Dispense:  20 tablet    Refill:  0   38 minutes spent obtaining history interviewing patient performing exam complex decision making Follow-up: Return  in about 2 months (around 11/12/2021).    Asencion Noble, MD

## 2021-09-12 ENCOUNTER — Encounter: Payer: Self-pay | Admitting: Critical Care Medicine

## 2021-09-12 ENCOUNTER — Ambulatory Visit: Payer: 59 | Attending: Critical Care Medicine | Admitting: Critical Care Medicine

## 2021-09-12 ENCOUNTER — Telehealth: Payer: Self-pay | Admitting: Critical Care Medicine

## 2021-09-12 ENCOUNTER — Other Ambulatory Visit: Payer: Self-pay

## 2021-09-12 VITALS — BP 95/65 | HR 83 | Resp 16 | Wt 210.0 lb

## 2021-09-12 DIAGNOSIS — E876 Hypokalemia: Secondary | ICD-10-CM | POA: Diagnosis not present

## 2021-09-12 DIAGNOSIS — I472 Ventricular tachycardia, unspecified: Secondary | ICD-10-CM

## 2021-09-12 DIAGNOSIS — I159 Secondary hypertension, unspecified: Secondary | ICD-10-CM

## 2021-09-12 DIAGNOSIS — B009 Herpesviral infection, unspecified: Secondary | ICD-10-CM

## 2021-09-12 DIAGNOSIS — I5022 Chronic systolic (congestive) heart failure: Secondary | ICD-10-CM | POA: Diagnosis not present

## 2021-09-12 DIAGNOSIS — Z21 Asymptomatic human immunodeficiency virus [HIV] infection status: Secondary | ICD-10-CM

## 2021-09-12 DIAGNOSIS — F17200 Nicotine dependence, unspecified, uncomplicated: Secondary | ICD-10-CM

## 2021-09-12 DIAGNOSIS — N401 Enlarged prostate with lower urinary tract symptoms: Secondary | ICD-10-CM

## 2021-09-12 DIAGNOSIS — F4310 Post-traumatic stress disorder, unspecified: Secondary | ICD-10-CM

## 2021-09-12 DIAGNOSIS — J449 Chronic obstructive pulmonary disease, unspecified: Secondary | ICD-10-CM

## 2021-09-12 DIAGNOSIS — F17210 Nicotine dependence, cigarettes, uncomplicated: Secondary | ICD-10-CM

## 2021-09-12 DIAGNOSIS — R3911 Hesitancy of micturition: Secondary | ICD-10-CM

## 2021-09-12 MED ORDER — AZITHROMYCIN 250 MG PO TABS: ORAL_TABLET | ORAL | 0 refills | Status: DC

## 2021-09-12 MED ORDER — PREDNISONE 10 MG PO TABS: ORAL_TABLET | ORAL | 0 refills | Status: DC

## 2021-09-12 MED ORDER — EPLERENONE 25 MG PO TABS: 12.5000 mg | ORAL_TABLET | Freq: Every day | ORAL | 11 refills | Status: DC

## 2021-09-12 MED ORDER — FLUTICASONE FUROATE-VILANTEROL 100-25 MCG/INH IN AEPB: 1.0000 | INHALATION_SPRAY | Freq: Every day | RESPIRATORY_TRACT | 4 refills | Status: DC

## 2021-09-12 MED ORDER — NICOTINE 7 MG/24HR TD PT24: 7.0000 mg | MEDICATED_PATCH | Freq: Every day | TRANSDERMAL | 0 refills | Status: DC

## 2021-09-12 MED ORDER — HYDROXYZINE HCL 25 MG PO TABS: ORAL_TABLET | ORAL | 3 refills | Status: DC

## 2021-09-12 MED ORDER — VALACYCLOVIR HCL 500 MG PO TABS: ORAL_TABLET | ORAL | 4 refills | Status: DC

## 2021-09-12 MED ORDER — TRAZODONE HCL 100 MG PO TABS: 100.0000 mg | ORAL_TABLET | Freq: Every evening | ORAL | 3 refills | Status: DC | PRN

## 2021-09-12 MED ORDER — TAMSULOSIN HCL 0.4 MG PO CAPS: ORAL_CAPSULE | ORAL | 2 refills | Status: DC

## 2021-09-12 NOTE — Assessment & Plan Note (Signed)
Chronic systolic heart failure nonischemic cardiomyopathy cocaine and alcohol related  Again recommended patient abstain from alcohol and tobacco products  Keep appointment with upcoming stress test  Patient says he is not using cocaine currently  No change in medications refills issued  Check metabolic panel

## 2021-09-12 NOTE — Telephone Encounter (Signed)
The patient is in [place] and has remote scheduled.

## 2021-09-12 NOTE — Assessment & Plan Note (Signed)
[facility]

## 2021-09-12 NOTE — Progress Notes (Signed)
La 95/65

## 2021-09-12 NOTE — Assessment & Plan Note (Signed)
COPD with acute flare  Pulse prednisone and give azithromycin for 5 days continue Breo inhaler

## 2021-09-12 NOTE — Assessment & Plan Note (Signed)
Per cardiology 

## 2021-09-12 NOTE — Assessment & Plan Note (Signed)
-   Continue Flomax 

## 2021-09-12 NOTE — Telephone Encounter (Signed)
History of cocaine and alcohol abuse currently not using cocaine per patient would benefit from an encounter with you

## 2021-09-12 NOTE — Patient Instructions (Signed)
Refills on all medications sent to your [facility]  Labs today is a metabolic panel  Take prednisone 4 tablets daily for 5 days and azithromycin per instruction for your cough  Remember to take a slow deep inhalation on your inhaler  Return to see Dr. Delford Field 2 months  Keep upcoming appointments with cardiology

## 2021-09-12 NOTE — Assessment & Plan Note (Signed)
Referral to licensed clinical social worker made

## 2021-09-12 NOTE — Assessment & Plan Note (Signed)
  .   Current smoking consumption amount: 1 pack every day  Dicsussion on advise to quit smoking and smoking impacts: Cardiovascular lung impacts  Patient's willingness to quit: Willing to quit  Methods to quit smoking discussed: Behavioral modification  Medication management of smoking session drugs discussed: on wellbutrin   Resources provided:  AVS   Setting quit date not established  Follow-up arranged 4 month   Time spent counseling the patient: 5 minutes   

## 2021-09-12 NOTE — Assessment & Plan Note (Signed)
No change in medications blood pressure controlled

## 2021-09-13 LAB — BASIC METABOLIC PANEL
BUN/Creatinine Ratio: 7 — ABNORMAL LOW (ref 10–24)
BUN: 8 mg/dL (ref 8–27)
CO2: 23 mmol/L (ref 20–29)
Calcium: 8.6 mg/dL (ref 8.6–10.2)
Chloride: 107 mmol/L — ABNORMAL HIGH (ref 96–106)
Creatinine, Ser: 1.12 mg/dL (ref 0.76–1.27)
Glucose: 68 mg/dL — ABNORMAL LOW (ref 70–99)
Potassium: 3.9 mmol/L (ref 3.5–5.2)
Sodium: 142 mmol/L (ref 134–144)
eGFR: 75 mL/min/{1.73_m2} (ref >59)

## 2021-09-15 ENCOUNTER — Other Ambulatory Visit: Payer: 59 | Admitting: *Deleted

## 2021-09-15 ENCOUNTER — Encounter (HOSPITAL_COMMUNITY): Payer: Self-pay | Admitting: *Deleted

## 2021-09-15 ENCOUNTER — Ambulatory Visit (HOSPITAL_COMMUNITY): Payer: 59

## 2021-09-15 ENCOUNTER — Encounter (HOSPITAL_COMMUNITY): Payer: Self-pay

## 2021-09-15 ENCOUNTER — Other Ambulatory Visit: Payer: Self-pay

## 2021-09-15 DIAGNOSIS — I472 Ventricular tachycardia, unspecified: Secondary | ICD-10-CM

## 2021-09-15 DIAGNOSIS — I5022 Chronic systolic (congestive) heart failure: Secondary | ICD-10-CM

## 2021-09-15 LAB — BASIC METABOLIC PANEL
BUN/Creatinine Ratio: 14 (ref 10–24)
BUN: 16 mg/dL (ref 8–27)
CO2: 22 mmol/L (ref 20–29)
Calcium: 8.5 mg/dL — ABNORMAL LOW (ref 8.6–10.2)
Chloride: 106 mmol/L (ref 96–106)
Creatinine, Ser: 1.15 mg/dL (ref 0.76–1.27)
Glucose: 178 mg/dL — ABNORMAL HIGH (ref 70–99)
Potassium: 4.2 mmol/L (ref 3.5–5.2)
Sodium: 142 mmol/L (ref 134–144)
eGFR: 73 mL/min/{1.73_m2} (ref >59)

## 2021-09-15 NOTE — Progress Notes (Signed)
Patient arrived for CPX with immediate concerns regarding his Defibrillator and the chance of it firing again after last month's incident (08/11/21). He was described the nature and importance of this procedure vs. Risk of defibrillation, and he was not convinced to proceed. Provider was requested to speak to patient and patient left the room, upset and demanding that someone "show him the way out of here". He declined and refused to wait for the provider and left the appointment.     Reggy Eye, MS, ACSM, [facility] Clinical Exercise Physiologist/ Health and Wellness Coach

## 2021-09-21 ENCOUNTER — Ambulatory Visit (HOSPITAL_COMMUNITY)
Admission: EM | Admit: 2021-09-21 | Discharge: 2021-09-21 | Disposition: A | Discharge status: Home / Self Care | Payer: 59 | Attending: Medical Oncology | Admitting: Medical Oncology

## 2021-09-21 ENCOUNTER — Encounter (HOSPITAL_COMMUNITY): Payer: Self-pay | Admitting: Emergency Medicine

## 2021-09-21 ENCOUNTER — Other Ambulatory Visit: Payer: Self-pay

## 2021-09-21 DIAGNOSIS — H1132 Conjunctival hemorrhage, left eye: Secondary | ICD-10-CM

## 2021-09-21 DIAGNOSIS — J441 Chronic obstructive pulmonary disease with (acute) exacerbation: Secondary | ICD-10-CM | POA: Diagnosis not present

## 2021-09-21 MED ORDER — BENZONATATE 200 MG PO CAPS: 200.0000 mg | ORAL_CAPSULE | Freq: Three times a day (TID) | ORAL | 0 refills | Status: DC | PRN

## 2021-09-21 MED ORDER — PREDNISONE 10 MG PO TABS: ORAL_TABLET | ORAL | 0 refills | Status: DC

## 2021-09-21 MED ORDER — FLUTICASONE PROPIONATE 50 MCG/ACT NA SUSP: 2.0000 | Freq: Every day | NASAL | 0 refills | Status: DC

## 2021-09-21 NOTE — ED Triage Notes (Signed)
Pt reports Tuesday morning woke up and noticed that left eye red. Denies pain, itching, visual disturbance. Repots been doing a lot of coughing and sneezing.

## 2021-09-21 NOTE — ED Provider Notes (Signed)
[facility]    CSN: [id number] Arrival date & time: 09/21/21  0801      History   Chief Complaint Chief Complaint  Patient presents with   Eye Problem    HPI Ryan Long is a 60 y.o. male.   HPI  Eye Problem: Pt reports that for the past 1 day he has had erythema of his right eye. Started after a coughing and sneezing fit due to recent COPD exacerbation. He reports that this exacerbation has been present for the past 2 weeks. He has seen his PCP and was started on prednisone and azithromycin which have helped but cough is still present. He continued to take his COPD inhalers as directed with success. No recent chest pains, fevers or worsening SOB. In terms of his eye he reports no pain, visual change, trauma.   Past Medical History:  Diagnosis Date   Active smoker    AICD (automatic cardioverter/defibrillator) present    Alcohol abuse    Anxiety    AR (allergic rhinitis)    Bipolar 1 disorder (HCC)    Cervical lymphadenitis 04/20/2021   CHF (congestive heart failure) (HCC)    Chronic bronchitis (HCC)    Chronic systolic heart failure (HCC)    Controlled substance agreement signed 10/22/2018   COPD (chronic obstructive pulmonary disease) (HCC)    Cough 12/31/2018   Crack cocaine use    Depression    Genital herpes    HIV (human immunodeficiency virus infection) (HCC) dx'd 08/1993   HLD (hyperlipidemia)    Hypertension    NICM (nonischemic cardiomyopathy) (HCC)    Echocardiogram 06/28/11: EF 30-35%, mild MR, mild LAE;  No CAD by coronary CT [facility]ogram 3/12 [facility]   NSVT (nonsustained ventricular tachycardia)    PTSD (post-traumatic stress disorder)     Patient Active Problem List   Diagnosis Date Noted   Hip pain 01/06/2021   VT (ventricular tachycardia) 08/25/2020   HIV positive (HCC) 07/12/2020   Atrial fibrillation (HCC) 06/10/2020   BPH (benign prostatic hyperplasia) 01/28/2020   HFrEF (heart failure with reduced  ejection fraction) (HCC) 01/28/2020   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   AICD (automatic cardioverter/defibrillator) present 08/15/2018   Abnormal EKG 06/28/2018   Adjustment disorder with mixed disturbance of emotions and conduct    NICM (nonischemic cardiomyopathy) (HCC) 01/08/2018   Anxiety 12/20/2017   Bipolar 1 disorder (HCC) 10/10/2017   Chronic systolic heart failure (HCC) 05/13/2017   HSV-2 infection 07/17/2016   History of attempted suicide 04/14/2016   History of alcoholism (HCC) 04/14/2016   History of substance abuse (HCC) 04/14/2016   Constipation 01/17/2016   Chronic obstructive pulmonary disease (HCC) 10/04/2015   Abnormal thyroid stimulating hormone (TSH) level 10/04/2015   MDD (major depressive disorder), recurrent episode, severe (HCC) 09/23/2015   Hypertension 09/10/2015   PTSD (post-traumatic stress disorder) 08/07/2015   Primary insomnia 04/02/2015   Herpesviral infection of penis 04/02/2015   Generalized anxiety disorder 05/29/2014   Tobacco use disorder 02/17/2013   Heart failure (HCC) 09/19/2011   Other primary cardiomyopathies 08/24/2011   Human immunodeficiency virus (HIV) disease (HCC) 02/01/2009   Recurrent HSV (herpes simplex virus) 02/01/2009   Hyperlipidemia 02/01/2009   Allergic rhinitis 02/01/2009    Past Surgical History:  Procedure Laterality Date   CARDIAC DEFIBRILLATOR PLACEMENT  01/08/2018   ICD IMPLANT N/A 01/08/2018   Procedure: ICD IMPLANT;  Surgeon: Regan Lemming, MD;  Location: [facility];  Service: Cardiovascular;  Laterality: N/A;  Home Medications    Prior to Admission medications   Medication Sig Start Date End Date Taking? Authorizing Provider  aspirin EC 81 MG tablet Take 81 mg by mouth daily. Swallow whole.    [provider]  azithromycin (ZITHROMAX) 250 MG tablet Take two once then one daily until gone 09/12/21   Storm Frisk, MD  bictegravir-emtricitabine-tenofovir AF (BIKTARVY)  50-200-25 MG TABS tablet Take 1 tablet by mouth daily. 06/09/21   Randall Hiss, MD  eplerenone (INSPRA) 25 MG tablet Take 0.5 tablets (12.5 mg total) by mouth daily. 09/12/21   Storm Frisk, MD  fluticasone furoate-vilanterol (BREO ELLIPTA) 100-25 MCG/INH AEPB Inhale 1 puff into the lungs daily. 09/12/21   Storm Frisk, MD  hydrOXYzine (ATARAX/VISTARIL) 25 MG tablet TAKE 1-2 TABLETS BY MOUTH 2 TIMES DAILY AS NEEDED FOR ANXIETY. 09/12/21   Storm Frisk, MD  Melatonin 10 MG CAPS Take by mouth at bedtime.    [provider]  metoprolol succinate (TOPROL-XL) 50 MG 24 hr tablet Take 1 tablet (50 mg total) by mouth daily. For high blood pressure 09/06/21   Graciella Freer, PA-C  Multiple Vitamins-Minerals (MENS MULTIPLUS PO) Take by mouth.    [provider]  nicotine (NICOTINE STEP 3) 7 mg/24hr patch Place 1 patch (7 mg total) onto the skin daily. 09/12/21   Storm Frisk, MD  potassium chloride SA (KLOR-CON) 20 MEQ tablet Take 1 tablet (20 mEq total) by mouth daily. 09/07/21   Graciella Freer, PA-C  predniSONE (DELTASONE) 10 MG tablet Take 4 tablets daily for 5 days then stop 09/12/21   Storm Frisk, MD  sacubitril-valsartan (ENTRESTO) 24-26 MG Take 1 tablet by mouth 2 (two) times daily. 09/06/21   Graciella Freer, PA-C  sildenafil (VIAGRA) 50 MG tablet Take 50 mg by mouth as needed. 09/02/21   [provider]  tamsulosin (FLOMAX) 0.4 MG CAPS capsule TAKE 2 CAPSULES(0.8MG  TOTAL) BY MOUTH EVERY DAY AFTER SUPPER FOR PROSTATE HEALTH 09/12/21   Storm Frisk, MD  traZODone (DESYREL) 100 MG tablet Take 1 tablet (100 mg total) by mouth at bedtime as needed for sleep. 09/12/21   Storm Frisk, MD  valACYclovir (VALTREX) 500 MG tablet TAKE 1 TABLET BY MOUTH EVERY DAY FOR HERPES 09/12/21   Storm Frisk, MD    Family History Family History  Problem Relation Age of Onset   Glaucoma Mother    Mental illness Mother    Vision loss  Mother    Hypertension Father    CAD Father    Mental illness Father    Alcohol abuse Father    Drug abuse Father    Heart attack Father 25   Early death Father    Alcohol abuse Brother    Drug abuse Brother    Drug abuse Brother     Social History Social History   Tobacco Use   Smoking status: Every Day    Packs/day: 0.75    Years: 38.00    Pack years: 28.50    Types: Cigarettes   Smokeless tobacco: Never   Tobacco comments:    hoping welbutrin will help  Vaping Use   Vaping Use: Never used  Substance Use Topics   Alcohol use: Not Currently    Alcohol/week: 6.0 standard drinks    Types: 6 Cans of beer per week    Comment: 01/08/2018 "stopped 06/2017"   Drug use: Not Currently    Types: Cocaine  Comment: 01/08/2018 "stopped 06/2017"     Allergies   Patient has no known allergies.   Review of Systems Review of Systems  As stated above in HPI Physical Exam Triage Vital Signs ED Triage Vitals [09/21/21 0811]  Enc Vitals Group     BP 101/67     Pulse Rate 93     Resp 18     Temp 97.6 F (36.4 C)     Temp Source Oral     SpO2 98 %     Weight      Height      Head Circumference      Peak Flow      Pain Score 0     Pain Loc      Pain Edu?      Excl. in GC?    No data found.  Updated Vital Signs BP 101/67 (BP Location: Right Arm)   Pulse 93   Temp 97.6 F (36.4 C) (Oral)   Resp 18   SpO2 98%   Physical Exam Vitals and nursing note reviewed.  Constitutional:      General: He is not in acute distress.    Appearance: Normal appearance. He is not ill-appearing, toxic-appearing or diaphoretic.  HENT:     Head: Normocephalic and atraumatic.     Right Ear: Tympanic membrane normal.     Left Ear: Tympanic membrane normal.     Nose: Congestion and rhinorrhea present.     Mouth/Throat:     Mouth: Mucous membranes are moist.     Pharynx: Oropharynx is clear.  Eyes:     General: No scleral icterus.       Right eye: No discharge.        Left eye:  No discharge.     Extraocular Movements: Extraocular movements intact.     Pupils: Pupils are equal, round, and reactive to light.     Comments: Hemorrhage of the left eye  Cardiovascular:     Rate and Rhythm: Normal rate and regular rhythm.     Pulses: Normal pulses.     Heart sounds: Normal heart sounds.  Pulmonary:     Effort: Pulmonary effort is normal. No respiratory distress.     Breath sounds: No stridor. Wheezing (scant wheezes throughout) present. No rhonchi or rales.  Chest:     Chest wall: No tenderness.  Musculoskeletal:     Cervical back: Normal range of motion and neck supple.  Lymphadenopathy:     Cervical: No cervical adenopathy.  Skin:    General: Skin is warm.  Neurological:     Mental Status: He is alert and oriented to person, place, and time.     UC Treatments / Results  Labs (all labs ordered are listed, but only abnormal results are displayed) Labs Reviewed - No data to display  EKG   Radiology No results found.  Procedures Procedures (including critical care time)  Medications Ordered in UC Medications - No data to display  Initial Impression / Assessment and Plan / UC Course  I have reviewed the triage vital signs and the nursing notes.  Pertinent labs & imaging results that were available during my care of the patient were reviewed by me and considered in my medical decision making (see chart for details).     New.  Discussed the subconjunctival hemorrhage with patient including that this will self resolve over the next few weeks. Discussed red flag signs and symptoms. Discussed his cough-he declines x ray at  this time but is agreeable to medications. Will send him home with tessalon, another short course of prednisone and flonase. He will continue his inhalers. Continue follow up with his PCP which he has soon or our office as needed.  Final Clinical Impressions(s) / UC Diagnoses   Final diagnoses:  None   Discharge Instructions    None    ED Prescriptions   None    PDMP not reviewed this encounter.   Rushie Chestnut, [place] 09/21/21 [phone]

## 2021-09-23 NOTE — Telephone Encounter (Signed)
First attempt to reach pt, no answer, left vm.

## 2021-09-26 ENCOUNTER — Emergency Department (HOSPITAL_COMMUNITY)
Admission: EM | Admit: 2021-09-26 | Discharge: 2021-09-26 | Discharge status: Against Medical Advice | Payer: 59 | Attending: Emergency Medicine | Admitting: Emergency Medicine

## 2021-09-26 ENCOUNTER — Emergency Department (HOSPITAL_COMMUNITY): Payer: 59

## 2021-09-26 ENCOUNTER — Telehealth: Payer: Self-pay | Admitting: Cardiology

## 2021-09-26 ENCOUNTER — Other Ambulatory Visit: Payer: Self-pay

## 2021-09-26 DIAGNOSIS — I5022 Chronic systolic (congestive) heart failure: Secondary | ICD-10-CM | POA: Diagnosis not present

## 2021-09-26 DIAGNOSIS — R000 Tachycardia, unspecified: Secondary | ICD-10-CM | POA: Insufficient documentation

## 2021-09-26 DIAGNOSIS — J449 Chronic obstructive pulmonary disease, unspecified: Secondary | ICD-10-CM | POA: Diagnosis not present

## 2021-09-26 DIAGNOSIS — Z7982 Long term (current) use of aspirin: Secondary | ICD-10-CM | POA: Diagnosis not present

## 2021-09-26 DIAGNOSIS — F17210 Nicotine dependence, cigarettes, uncomplicated: Secondary | ICD-10-CM | POA: Diagnosis not present

## 2021-09-26 DIAGNOSIS — R451 Restlessness and agitation: Secondary | ICD-10-CM | POA: Insufficient documentation

## 2021-09-26 DIAGNOSIS — Z21 Asymptomatic human immunodeficiency virus [HIV] infection status: Secondary | ICD-10-CM | POA: Diagnosis not present

## 2021-09-26 DIAGNOSIS — R7989 Other specified abnormal findings of blood chemistry: Secondary | ICD-10-CM | POA: Diagnosis not present

## 2021-09-26 DIAGNOSIS — Z95810 Presence of automatic (implantable) cardiac defibrillator: Secondary | ICD-10-CM | POA: Insufficient documentation

## 2021-09-26 DIAGNOSIS — Z4502 Encounter for adjustment and management of automatic implantable cardiac defibrillator: Secondary | ICD-10-CM

## 2021-09-26 DIAGNOSIS — Z79899 Other long term (current) drug therapy: Secondary | ICD-10-CM | POA: Insufficient documentation

## 2021-09-26 DIAGNOSIS — R454 Irritability and anger: Secondary | ICD-10-CM | POA: Diagnosis not present

## 2021-09-26 DIAGNOSIS — I110 Hypertensive heart disease with heart failure: Secondary | ICD-10-CM | POA: Diagnosis not present

## 2021-09-26 LAB — BASIC METABOLIC PANEL
Anion gap: 10 (ref 5–15)
BUN: 13 mg/dL (ref 6–20)
CO2: 22 mmol/L (ref 22–32)
Calcium: 8.7 mg/dL — ABNORMAL LOW (ref 8.9–10.3)
Chloride: 105 mmol/L (ref 98–111)
Creatinine, Ser: 1.38 mg/dL — ABNORMAL HIGH (ref 0.61–1.24)
GFR, Estimated: 59 mL/min — ABNORMAL LOW (ref >60)
Glucose, Bld: 112 mg/dL — ABNORMAL HIGH (ref 70–99)
Potassium: 3.9 mmol/L (ref 3.5–5.1)
Sodium: 137 mmol/L (ref 135–145)

## 2021-09-26 LAB — CBC WITH DIFFERENTIAL/PLATELET
Abs Immature Granulocytes: 0.01 10*3/uL (ref 0.00–0.07)
Basophils Absolute: 0 10*3/uL (ref 0.0–0.1)
Basophils Relative: 1 %
Eosinophils Absolute: 0 10*3/uL (ref 0.0–0.5)
Eosinophils Relative: 0 %
HCT: 50.1 % (ref 39.0–52.0)
Hemoglobin: 17.9 g/dL — ABNORMAL HIGH (ref 13.0–17.0)
Immature Granulocytes: 0 %
Lymphocytes Relative: 50 %
Lymphs Abs: 2.2 10*3/uL (ref 0.7–4.0)
MCH: 34.4 pg — ABNORMAL HIGH (ref 26.0–34.0)
MCHC: 35.7 g/dL (ref 30.0–36.0)
MCV: 96.2 fL (ref 80.0–100.0)
Monocytes Absolute: 0.3 10*3/uL (ref 0.1–1.0)
Monocytes Relative: 7 %
Neutro Abs: 1.9 10*3/uL (ref 1.7–7.7)
Neutrophils Relative %: 42 %
Platelets: 173 10*3/uL (ref 150–400)
RBC: 5.21 MIL/uL (ref 4.22–5.81)
RDW: 14 % (ref 11.5–15.5)
WBC: 4.5 10*3/uL (ref 4.0–10.5)
nRBC: 0 % (ref 0.0–0.2)

## 2021-09-26 LAB — TROPONIN I (HIGH SENSITIVITY)
Troponin I (High Sensitivity): 42 ng/L — ABNORMAL HIGH (ref <18)
Troponin I (High Sensitivity): 65 ng/L — ABNORMAL HIGH (ref <18)

## 2021-09-26 LAB — BRAIN NATRIURETIC PEPTIDE: B Natriuretic Peptide: 668.2 pg/mL — ABNORMAL HIGH (ref 0.0–100.0)

## 2021-09-26 LAB — MAGNESIUM: Magnesium: 2.2 mg/dL (ref 1.7–2.4)

## 2021-09-26 MED ORDER — SODIUM CHLORIDE 0.9 % IV BOLUS
500.0000 mL | Freq: Once | INTRAVENOUS | Status: AC
  Administered 2021-09-26: 500 mL via INTRAVENOUS

## 2021-09-26 NOTE — ED Notes (Signed)
Pt states that he is not staying any longer, refuses to stay, wants IV removed, refuses to sign AMA form.

## 2021-09-26 NOTE — Telephone Encounter (Signed)
Successful telephone encounter to patient to follow up on his complaint of shock x 2 this pm. Shock plan reviewed with patient and he is told to call 911. Attempted to assist patient with sending manual transmission while awaiting EMS. Has not come thru at this time. Updated industry that patient would present to [facility] ED with shock.

## 2021-09-26 NOTE — Telephone Encounter (Signed)
Spoke with pt and scheduled appt for 10/12/21 at 8:30

## 2021-09-26 NOTE — Telephone Encounter (Signed)
1. Has your device fired?  ICD shocked patient 2x  2. Is you device beeping?  No   3. Are you experiencing draining or swelling at device site?  No  4. Are you calling to see if we received your device transmission?  No   5. Have you passed out?  No  Patient states around 3:20 PM his device shocked him twice. She states we was walking uphill, on his way home, with groceries in his hand when his device shocked him. He bent over and dropped his groceries. As he was reaching to put them back in the bags it shocked him again. He states around this time he was still about 3 houses down from his house, but someone got out of their car and helped him.

## 2021-09-26 NOTE — ED Triage Notes (Signed)
Pt walking home up hill. Episode of SOB then shocked by defib x2 ([employer]). No fall. Called [facility] told to call 911. HR 120s with [facility]. No CP at this time.

## 2021-09-26 NOTE — ED Provider Notes (Signed)
[facility] EMERGENCY DEPARTMENT Provider Note   CSN: [id number] Arrival date & time: 09/26/21  1836     History Chief Complaint  Patient presents with   Pacemaker Problem    Ankur Rabbitt is a 60 y.o. male.  Jhamal Polmanteer is a 60 y.o. male with a history of nonischemic cardiomyopathy, with pacemaker/AICD in place, nonsustained V. tach, hypertension, hyperlipidemia, COPD, HIV, bipolar disorder, who presents to the emergency department for evaluation after his defibrillator fired twice today around 3:30 PM.  Patient reports he was walking up a hill carrying heavy groceries when he started to feel palpitations and then his defibrillator fired causing him to drop all of his groceries.  As he was trying to bend over and pick them up again he felt a second shock.  He reports that he tried to get together his belongings and walked home but he was feeling quite short of breath and a bit lightheaded.  Someone drove by and gave him a ride home.  He reports he called his cardiologist office, they could not get the defibrillator to transmit the report from these events but was told to call 911.  Heart rate in the 120s with sinus rhythm with EMS.  Reports he had some mild chest pain after getting shocked but none since then has continued to have some intermittent shortness of breath.  No syncopal episodes.  He denies any lower extremity swelling and reports he has been compliant with his medications.  The history is provided by the patient and medical records.      Past Medical History:  Diagnosis Date   Active smoker    AICD (automatic cardioverter/defibrillator) present    Alcohol abuse    Anxiety    AR (allergic rhinitis)    Bipolar 1 disorder (HCC)    Cervical lymphadenitis 04/20/2021   CHF (congestive heart failure) (HCC)    Chronic bronchitis (HCC)    Chronic systolic heart failure (HCC)    Controlled substance agreement signed 10/22/2018   COPD (chronic  obstructive pulmonary disease) (HCC)    Cough 12/31/2018   Crack cocaine use    Depression    Genital herpes    HIV (human immunodeficiency virus infection) (HCC) dx'd 08/1993   HLD (hyperlipidemia)    Hypertension    NICM (nonischemic cardiomyopathy) (HCC)    Echocardiogram 06/28/11: EF 30-35%, mild MR, mild LAE;  No CAD by coronary CT angiogram 3/12 at [facility]   NSVT (nonsustained ventricular tachycardia)    PTSD (post-traumatic stress disorder)     Patient Active Problem List   Diagnosis Date Noted   Hip pain 01/06/2021   VT (ventricular tachycardia) 08/25/2020   HIV positive (HCC) 07/12/2020   Atrial fibrillation (HCC) 06/10/2020   BPH (benign prostatic hyperplasia) 01/28/2020   HFrEF (heart failure with reduced ejection fraction) (HCC) 01/28/2020   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   AICD (automatic cardioverter/defibrillator) present 08/15/2018   Abnormal EKG 06/28/2018   Adjustment disorder with mixed disturbance of emotions and conduct    NICM (nonischemic cardiomyopathy) (HCC) 01/08/2018   Anxiety 12/20/2017   Bipolar 1 disorder (HCC) 10/10/2017   Chronic systolic heart failure (HCC) 05/13/2017   HSV-2 infection 07/17/2016   History of attempted suicide 04/14/2016   History of alcoholism (HCC) 04/14/2016   History of substance abuse (HCC) 04/14/2016   Constipation 01/17/2016   Chronic obstructive pulmonary disease (HCC) 10/04/2015   Abnormal thyroid stimulating hormone (TSH) level 10/04/2015   MDD (major depressive  disorder), recurrent episode, severe (HCC) 09/23/2015   Hypertension 09/10/2015   PTSD (post-traumatic stress disorder) 08/07/2015   Primary insomnia 04/02/2015   Herpesviral infection of penis 04/02/2015   Generalized anxiety disorder 05/29/2014   Tobacco use disorder 02/17/2013   Heart failure (HCC) 09/19/2011   Other primary cardiomyopathies 08/24/2011   Human immunodeficiency virus (HIV) disease (HCC) 02/01/2009   Recurrent  HSV (herpes simplex virus) 02/01/2009   Hyperlipidemia 02/01/2009   Allergic rhinitis 02/01/2009    Past Surgical History:  Procedure Laterality Date   CARDIAC DEFIBRILLATOR PLACEMENT  01/08/2018   ICD IMPLANT N/A 01/08/2018   Procedure: ICD IMPLANT;  Surgeon: Regan Lemming, MD;  Location: [facility] INVASIVE CV LAB;  Service: Cardiovascular;  Laterality: N/A;       Family History  Problem Relation Age of Onset   Glaucoma Mother    Mental illness Mother    Vision loss Mother    Hypertension Father    CAD Father    Mental illness Father    Alcohol abuse Father    Drug abuse Father    Heart attack Father 27   Early death Father    Alcohol abuse Brother    Drug abuse Brother    Drug abuse Brother     Social History   Tobacco Use   Smoking status: Every Day    Packs/day: 0.75    Years: 38.00    Pack years: 28.50    Types: Cigarettes   Smokeless tobacco: Never   Tobacco comments:    hoping welbutrin will help  Vaping Use   Vaping Use: Never used  Substance Use Topics   Alcohol use: Not Currently    Alcohol/week: 6.0 standard drinks    Types: 6 Cans of beer per week    Comment: 01/08/2018 "stopped 06/2017"   Drug use: Not Currently    Types: Cocaine    Comment: 01/08/2018 "stopped 06/2017"    Home Medications Prior to Admission medications   Medication Sig Start Date End Date Taking? Authorizing Provider  aspirin EC 81 MG tablet Take 81 mg by mouth daily. Swallow whole.   Yes [provider]  bictegravir-emtricitabine-tenofovir AF (BIKTARVY) 50-200-25 MG TABS tablet Take 1 tablet by mouth daily. 06/09/21  Yes Daiva Eves, Lisette Grinder, MD  eplerenone (INSPRA) 25 MG tablet Take 0.5 tablets (12.5 mg total) by mouth daily. 09/12/21  Yes Storm Frisk, MD  fluticasone (FLONASE) 50 MCG/ACT nasal spray Place 2 sprays into both nostrils daily. 09/21/21  Yes Covington, Sarah M, PA-C  fluticasone furoate-vilanterol (BREO ELLIPTA) 100-25 MCG/INH AEPB Inhale 1 puff into the  lungs daily. 09/12/21  Yes Storm Frisk, MD  hydrOXYzine (ATARAX/VISTARIL) 25 MG tablet TAKE 1-2 TABLETS BY MOUTH 2 TIMES DAILY AS NEEDED FOR ANXIETY. Patient taking differently: Take 25-50 mg by mouth 2 (two) times daily as needed for anxiety. 09/12/21  Yes Storm Frisk, MD  Melatonin 10 MG CAPS Take 10 mg by mouth at bedtime as needed (sleep).   Yes [provider]  metoprolol succinate (TOPROL-XL) 50 MG 24 hr tablet Take 1 tablet (50 mg total) by mouth daily. For high blood pressure 09/06/21  Yes Tillery, Mariam Dollar, PA-C  Multiple Vitamins-Minerals (MENS MULTIPLUS PO) Take 1 tablet by mouth daily.   Yes [provider]  nicotine (NICOTINE STEP 3) 7 mg/24hr patch Place 1 patch (7 mg total) onto the skin daily. 09/12/21  Yes Storm Frisk, MD  potassium chloride SA (KLOR-CON) 20 MEQ tablet Take 1  tablet (20 mEq total) by mouth daily. 09/07/21  Yes Graciella Freer, PA-C  sacubitril-valsartan (ENTRESTO) 24-26 MG Take 1 tablet by mouth 2 (two) times daily. 09/06/21  Yes Graciella Freer, PA-C  sildenafil (VIAGRA) 50 MG tablet Take 50 mg by mouth as needed for erectile dysfunction. 09/02/21  Yes [provider]  tamsulosin (FLOMAX) 0.4 MG CAPS capsule TAKE 2 CAPSULES(0.8MG  TOTAL) BY MOUTH EVERY DAY AFTER SUPPER FOR PROSTATE HEALTH Patient taking differently: Take 0.8 mg by mouth daily after supper. 09/12/21  Yes Storm Frisk, MD  traZODone (DESYREL) 100 MG tablet Take 1 tablet (100 mg total) by mouth at bedtime as needed for sleep. 09/12/21  Yes Storm Frisk, MD  valACYclovir (VALTREX) 500 MG tablet TAKE 1 TABLET BY MOUTH EVERY DAY FOR HERPES Patient taking differently: Take 500 mg by mouth daily. 09/12/21  Yes Storm Frisk, MD  azithromycin (ZITHROMAX) 250 MG tablet Take two once then one daily until gone Patient not taking: Reported on 09/26/2021 09/12/21   Storm Frisk, MD  benzonatate (TESSALON) 200 MG capsule Take 1 capsule (200  mg total) by mouth 3 (three) times daily as needed for cough. Patient not taking: Reported on 09/26/2021 09/21/21   Rushie Chestnut, PA-C  predniSONE (DELTASONE) 10 MG tablet Take 4 tablets daily for 5 days then stop Patient not taking: Reported on 09/26/2021 09/21/21   Rushie Chestnut, PA-C    Allergies    Patient has no known allergies.  Review of Systems   Review of Systems  Constitutional:  Positive for fatigue. Negative for chills and fever.  Respiratory:  Positive for shortness of breath. Negative for cough.   Cardiovascular:  Positive for palpitations. Negative for chest pain and leg swelling.  Gastrointestinal:  Negative for abdominal pain, nausea and vomiting.  Musculoskeletal:  Negative for myalgias.  Neurological:  Positive for light-headedness. Negative for dizziness, syncope, weakness and numbness.  All other systems reviewed and are negative.  Physical Exam Updated Vital Signs BP 119/89 (BP Location: Right Arm)   Pulse (!) 118   Temp 98.1 F (36.7 C) (Oral)   Resp 18   Ht 5\' 9"  (1.753 m)   Wt 92.5 kg   SpO2 97%   BMI 30.13 kg/m   Physical Exam Vitals and nursing note reviewed.  Constitutional:      General: He is not in acute distress.    Appearance: Normal appearance. He is well-developed. He is not ill-appearing or diaphoretic.     Comments: Well-appearing and in no distress   HENT:     Head: Normocephalic and atraumatic.  Eyes:     General:        Right eye: No discharge.        Left eye: No discharge.  Cardiovascular:     Rate and Rhythm: Regular rhythm. Tachycardia present.     Pulses: Normal pulses.     Heart sounds: Normal heart sounds.     Comments: Tachycardia with heart rate in the 110-120s with regular rhythm Pulmonary:     Effort: Pulmonary effort is normal. No respiratory distress.     Breath sounds: Normal breath sounds. No wheezing or rales.     Comments: Respirations equal and unlabored, patient able to speak in full sentences,  lungs clear to auscultation bilaterally  Abdominal:     General: Bowel sounds are normal. There is no distension.     Palpations: Abdomen is soft. There is no mass.     Tenderness:  There is no abdominal tenderness. There is no guarding.     Comments: Abdomen soft, nondistended, nontender to palpation in all quadrants without guarding or peritoneal signs  Musculoskeletal:        General: No deformity.     Cervical back: Neck supple.     Right lower leg: No edema.     Left lower leg: No edema.     Comments: No lower extremity edema noted, bilateral legs are warm and well-perfused.  Skin:    General: Skin is warm and dry.     Capillary Refill: Capillary refill takes less than 2 seconds.  Neurological:     Mental Status: He is alert and oriented to person, place, and time.     Coordination: Coordination normal.     Comments: Speech is clear, able to follow commands Moves extremities without ataxia, coordination intact  Psychiatric:        Mood and Affect: Mood normal.        Behavior: Behavior normal.    ED Results / Procedures / Treatments   Labs (all labs ordered are listed, but only abnormal results are displayed) Labs Reviewed  BASIC METABOLIC PANEL - Abnormal; Notable for the following components:      Result Value   Glucose, Bld 112 (*)    Creatinine, Ser 1.38 (*)    Calcium 8.7 (*)    GFR, Estimated 59 (*)    All other components within normal limits  CBC WITH DIFFERENTIAL/PLATELET - Abnormal; Notable for the following components:   Hemoglobin 17.9 (*)    MCH 34.4 (*)    All other components within normal limits  BRAIN NATRIURETIC PEPTIDE - Abnormal; Notable for the following components:   B Natriuretic Peptide 668.2 (*)    All other components within normal limits  TROPONIN I (HIGH SENSITIVITY) - Abnormal; Notable for the following components:   Troponin I (High Sensitivity) 42 (*)    All other components within normal limits  TROPONIN I (HIGH SENSITIVITY) -  Abnormal; Notable for the following components:   Troponin I (High Sensitivity) 65 (*)    All other components within normal limits  RESP PANEL BY RT-PCR (FLU A&B, COVID) [id number]  MAGNESIUM    EKG EKG Interpretation  Date/Time:  Monday September 26 2021 18:41:08 EDT Ventricular Rate:  119 PR Interval:  185 QRS Duration: 148 QT Interval:  318 QTC Calculation: 448 R Axis:   44 Text Interpretation: Sinus tachycardia LAE, consider biatrial enlargement Nonspecific intraventricular conduction delay Probable anteroseptal infarct, recent Since last tracing rate faster Confirmed by Richardean Canal [phone]) on 09/26/2021 7:03:31 PM  Radiology DG Chest Port 1 View  Result Date: 09/26/2021 CLINICAL DATA:  Defibrillator fired, shortness of breath EXAM: PORTABLE CHEST 1 VIEW COMPARISON:  06/24/2020 FINDINGS: Single frontal view of the chest demonstrates stable single lead AICD. Cardiac silhouette is unremarkable. No airspace disease, effusion, or pneumothorax. No acute bony abnormalities. IMPRESSION: 1. Stable chest, no acute process. Electronically Signed   By: Sharlet Salina M.D.   On: 09/26/2021 19:07    Procedures Procedures   Medications Ordered in ED Medications  sodium chloride 0.9 % bolus 500 mL (0 mLs Intravenous Stopped 09/26/21 2156)    ED Course  I have reviewed the triage vital signs and the nursing notes.  Pertinent labs & imaging results that were available during my care of the patient were reviewed by me and considered in my medical decision making (see chart for details).    MDM Rules/Calculators/A&P  60 year old male arrives via EMS after he reports that his defibrillator fired twice.  He denies any associated syncope, had brief chest pain after the shock but none preceding the event.  Does report that he started to feel very short of breath and fatigued before it shocked, he was walking up a hill while carrying groceries when this occurred.  He then  got himself home and has not had any additional shocks since around 330 this afternoon.  Called his cardiologist who directed him to come straight to the emergency department via EMS.  With EMS patient noted to be in sinus rhythm with heart rate in the 120s.  On arrival patient is alert, well-appearing and in no acute distress.  Sinus tachycardia noted but vitals otherwise stable.  Lungs clear and patient does not appear fluid overloaded, baseline EF of 30-35%.  Followed by Dr. Milas Kocher and Dr. Elberta Fortis.  Will check electrolytes, troponin, BNP and chest x-ray.  EKG here shows sinus tachycardia.  We will interrogate pacemaker and discussed with cardiology.  I have independently ordered, reviewed and interpreted all labs and imaging: CBC: No leukocytosis, mildly elevated hemoglobin BMP: Mildly elevated creatinine at 1.38, but no other significant electrolyte derangements. Initial troponin of 42, delta troponin increased at 65, patient not currently having any chest pain, likely in the setting of defibrillator fire. BNP of 668.2  Chest x-ray stable with no acute cardiopulmonary disease.  Delay in getting defibrillator interrogation report, but rep is on their way to the department to interrogate device.  Interrogation report reviewed it appears that the defibrillator was initially triggered due to tachycardia of 170-180 bpm, there are a few irregular beats concerning for potential A. fib RVR.  Patient then had a sustained run of wide-complex tachycardia concerning for V. tach.  Confirmed to defibrillator fires on report.  Called by nursing staff who reported that the patient became very angry and agitated, ripped out his IV and reported he was leaving.  Patient would not stay to speak with provider.  Nurse attempted to remove explained risks of leaving the patient but he reported that he did not care and walked out of the department.  Throughout ED stay patient has been alert and oriented with  decision-making capacity.  I had placed a consult to cardiology fellow and briefly discussed patient with him but informed him that patient has left AGAINST MEDICAL ADVICE.    Final Clinical Impression(s) / ED Diagnoses Final diagnoses:  Defibrillator discharge    Rx / DC Orders ED Discharge Orders     None        Legrand Rams 09/26/21 2335    Charlynne Pander, MD 09/29/21 [phone]

## 2021-10-02 NOTE — Progress Notes (Signed)
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 aspect of the right upper lobe (series 2, image 245).   LUNGS: Mild bronchial wall thickening. No focal airspace consolidation. Scattered areas of linear scarring and/or subsegmental atelectasis.   PLEURA: No pleural thickening or effusion. No pneumothorax.   HEART: Heart size normal. No pericardial effusion. Single-lead left chest wall cardiac rhythm maintenance device with lead terminating within the right ventricle.   CORONARY ARTERY CALCIFICATION: None.   MEDIASTINUM/HILUM/AXILLA: No adenopathy.   OTHER FINDINGS: None. Exam End: 08/26/21 09:56   Echo 04/2021 1. Left ventricular ejection fraction, by estimation, is 20 to 25%. The left ventricle has severely decreased function. The left ventricle has no regional wall motion abnormalities. The left ventricular internal cavity size was mildly to  moderately dilated. Left ventricular diastolic parameters are consistent with Grade I diastolic dysfunction (impaired relaxation). 2. Right ventricular systolic function is normal. The right ventricular size is normal. 3. The mitral valve is normal in structure. Mild mitral valve regurgitation. No evidence of mitral stenosis. 4. The aortic valve is normal in structure. Aortic valve regurgitation is not visualized. No aortic stenosis is present. 5. The inferior vena cava is normal in size with greater than 50% respiratory variability, suggesting right atrial pressure of 3 mmHg. Left Ventricle: Left ventricular ejection fraction, by estimation, is 20 to 25%. T   Below is the Welcome to Medicare wellness visit done Virtually   Ryan [name] is a 60 y.o. male who [name] for Medicare Annual/Subsequent preventive examination.   Review of Systems   As above       Objective:  No vitals this is a phone visit There were no vitals filed for this visit. There is no height or weight on file to calculate BMI.  Note below [place] as shown previously does not have an advanced directive we discussed this and he is interested in obtaining additional information on this we will provide this at the next visit Advanced Directives 08/29/2021 03/18/2021 08/07/2020 01/04/2019 02/25/2018 02/23/2018 01/08/2018  Does Patient Have a Medical Advance Directive? _0  No No  Would patient like information on creating a medical advance directive? No - Patient declined No - Patient declined No - Patient declined No - Patient declined No - Patient declined Yes (ED - Information included in AVS) No - Patient declined  Pre-existing out of facility DNR order (yellow form or pink MOST form) - - - - - - -  Some encounter information is confidential and restricted. Go to Review Flowsheets activity to see all data.    Current Medications (verified) Outpatient Encounter Medications as of 10/03/2021  Medication Sig    aspirin EC 81 MG tablet Take 81 mg by  mouth daily. Swallow whole.   bictegravir-emtricitabine-tenofovir AF (BIKTARVY) 50-200-25 MG TABS tablet Take 1 tablet by mouth daily.   eplerenone (INSPRA) 25 MG tablet Take 0.5 tablets (12.5 mg total) by mouth daily.   fluticasone furoate-vilanterol (BREO ELLIPTA) 100-25 MCG/INH AEPB Inhale 1 puff into the lungs daily.   hydrOXYzine (ATARAX/VISTARIL) 25 MG tablet TAKE 1-2 TABLETS BY MOUTH 2 TIMES DAILY AS NEEDED FOR ANXIETY. (Patient taking differently: Take 25-50 mg by mouth 2 (two) times daily as needed for anxiety.)   Melatonin 10 MG CAPS Take 10 mg by mouth at bedtime as needed (sleep).   metoprolol succinate (TOPROL-XL) 50 MG 24 hr tablet Take 1 tablet (50 mg total) by mouth daily. For high blood pressure   Multiple Vitamins-Minerals (MENS MULTIPLUS PO) Take 1 tablet by mouth daily.   nicotine (NICOTINE STEP 3) 7 mg/24hr patch Place 1 patch (7 mg total) onto the skin daily.   potassium chloride SA (KLOR-CON) 20 MEQ tablet Take 1 tablet (20 mEq total) by mouth daily.   sacubitril-valsartan (ENTRESTO) 24-26 MG Take 1 tablet by mouth 2 (two) times daily.   sildenafil (VIAGRA) 50 MG tablet Take 50 mg by mouth as needed for erectile dysfunction.   tamsulosin (FLOMAX) 0.4 MG CAPS capsule TAKE 2 CAPSULES(0.8MG TOTAL) BY MOUTH EVERY DAY AFTER SUPPER FOR PROSTATE HEALTH (Patient taking differently: Take 0.8 mg by mouth daily after supper.)   traZODone (DESYREL) 100 MG tablet Take 1 tablet (100 mg total) by mouth at bedtime as needed for sleep.   valACYclovir (VALTREX) 500 MG tablet TAKE 1 TABLET BY MOUTH EVERY DAY FOR HERPES (Patient taking differently: Take 500 mg by mouth daily.)   [DISCONTINUED] azithromycin (ZITHROMAX) 250 MG tablet Take two once then one daily until gone (Patient not taking: Reported on 09/26/2021)   [DISCONTINUED] benzonatate (TESSALON) 200 MG capsule Take 1 capsule (200 mg total) by mouth 3 (three) times daily as needed for cough.  (Patient not taking: Reported on 09/26/2021)   [DISCONTINUED] fluticasone (FLONASE) 50 MCG/ACT nasal spray Place 2 sprays into both nostrils daily.   [DISCONTINUED] predniSONE (DELTASONE) 10 MG tablet Take 4 tablets daily for 5 days then stop (Patient not taking: Reported on 09/26/2021)   No facility-administered encounter medications on file as of 10/03/2021.    Allergies (verified) Patient has no known allergies.   History: Past Medical History:  Diagnosis Date   Active smoker    AICD (automatic cardioverter/defibrillator) present    Alcohol abuse    Anxiety    AR (allergic rhinitis)    Bipolar 1 disorder (HCC)    Cervical lymphadenitis 04/20/2021   CHF (congestive heart failure) (HCC)    Chronic bronchitis (HCC)    Chronic systolic heart failure (HCC)    Controlled substance agreement signed 10/22/2018   COPD (chronic obstructive pulmonary disease) ([place])    Cough 12/31/2018   Crack cocaine use    Depression    Genital herpes    HIV (human immunodeficiency virus infection) ([place]) dx'd 08/1993   HLD (hyperlipidemia)    Hypertension    NICM (nonischemic cardiomyopathy) ([place])    Echocardiogram 06/28/11: EF 30-35%, mild MR, mild LAE;  No CAD by coronary CT angiogram 3/12 at [facility]   NSVT (nonsustained ventricular tachycardia)    PTSD (post-traumatic stress disorder)    Past Surgical History:  Procedure Laterality Date   CARDIAC DEFIBRILLATOR PLACEMENT  01/08/2018   ICD IMPLANT N/A 01/08/2018   Procedure: ICD IMPLANT;  Surgeon: Constance Haw, MD;  Location:  [place] INVASIVE CV LAB;  Service: Cardiovascular;  Laterality: N/A;   Family History  Problem Relation Age of Onset   Glaucoma Mother    Mental illness Mother    Vision loss Mother    Hypertension Father    CAD Father    Mental illness Father    Alcohol abuse Father    Drug abuse Father    Heart attack Father 62   Early death Father    Alcohol abuse Brother    Drug abuse Brother    Drug abuse  Brother    Social History   Socioeconomic History   Marital status: Single    Spouse name: Not on file   Number of children: Not on file   Years of education: Not on file   Highest education level: Not on file  Occupational History   Not on file  Tobacco Use   Smoking status: Every Day    Packs/day: 0.75    Years: 38.00    Pack years: 28.50    Types: Cigarettes   Smokeless tobacco: Never   Tobacco comments:    hoping welbutrin will help  Vaping Use   Vaping Use: Never used  Substance and Sexual Activity   Alcohol use: Not Currently    Alcohol/week: 6.0 standard drinks    Types: 6 Cans of beer per week    Comment: 01/08/2018 "stopped 06/2017"   Drug use: Not Currently    Types: Cocaine    Comment: 01/08/2018 "stopped 06/2017"   Sexual activity: Yes    Birth control/protection: Condom    Comment: pt. declined condoms  Other Topics Concern   Not on file  Social History Narrative   Not on file   Social Determinants of Health   Financial Resource Strain: Not on file  Food Insecurity: Not on file  Transportation Needs: Not on file  Physical Activity: Not on file  Stress: Not on file  Social Connections: Not on file    Tobacco Counseling Ready to quit: not as of yet Counseling given: given Tobacco comments: na   Clinical Intake: As noted above                Diabetic?no         Activities of Daily Living In your present state of health, do you have any difficulty performing the following activities: 10/03/2021  Hearing? N  Vision? N  Difficulty concentrating or making decisions? N  Walking or climbing stairs? N  Dressing or bathing? N  Some recent data might be hidden    Patient Care Team: Elsie Stain, MD as PCP - General (Pulmonary Disease) Constance Haw, MD as PCP - Electrophysiology (Cardiology) Constance Haw, MD as Consulting Physician (Cardiology)  Indicate any recent Medical Services you may have received from  other than [facility] providers in the past year (date may be approximate). Patient saw cardiology at [facility] this summer but states he will not follow-up with them we will continue to follow with [facility] cardiology     Assessment:   This is a routine Welcome to [employer] wellness examination for Ryan [name].  Hearing/Vision screen NA  Dietary issues and exercise activities discussed: Healthy heart diet and increased exercise levels     Depression Screen PHQ 2/9 Scores 10/03/2021 09/12/2021 05/23/2021 04/25/2021 04/20/2021 03/22/2021 01/06/2021  PHQ - 2 Score 2 0 0 0 0 1 0  PHQ- 9 Score 6 - 1 0 - 3 0    Fall Risk Fall Risk  10/03/2021 09/12/2021 05/23/2021 04/25/2021 04/20/2021  Falls in the past year? 0 0 0 0 0  Number falls in past yr: - 0 0 0 -  Injury with Fall? - 0 0 0 -  Risk for fall due to : - No Fall Risks No Fall Risks - -  Follow up - - - - Falls evaluation completed    FALL RISK PREVENTION PERTAINING TO THE HOME:  Any stairs in or around the home? No  If so, are there any without handrails? No  Home free of loose throw rugs in walkways, pet beds, electrical cords, etc? No  Adequate lighting in your home to reduce risk of falls? Yes   ASSISTIVE DEVICES UTILIZED TO PREVENT FALLS:  Life alert? Yes  Use of a cane, walker or w/c? No  Grab bars in the bathroom? No  Shower chair or bench in shower? No  Elevated toilet seat or a handicapped toilet? No   TIMED UP AND GO:  Was the test performed? No .  Length of time to ambulate 10 feet:  sec.   Gait steady and fast without use of assistive device  Cognitive Function: Is excellent        Immunizations Immunization History  Administered Date(s) Administered   Hepatitis B 08/24/2011, 09/25/2011   Influenza Split 09/25/2011, 09/04/2018, 10/29/2019   Influenza Whole 09/20/2009, 08/24/2011   Influenza,inj,Quad PF,6+ Mos 12/22/2013, 08/16/2015, 11/17/2016, 09/12/2017   Influenza-Unspecified 10/18/2017, 08/15/2019   Janssen (J&J)  SARS-COV-2 Vaccination 03/05/2020   Moderna SARS-COV2 Booster Vaccination 02/02/2021   PFIZER(Purple Top)SARS-COV-2 Vaccination 09/29/2020, 09/04/2021   PNEUMOCOCCAL CONJUGATE-20 05/23/2021   PPD Test 11/19/2007, 11/01/2010   Pneumococcal Conjugate-13 02/10/2014   Pneumococcal Polysaccharide-23 05/23/2004, 10/01/2009, 01/17/2016   Tdap 02/10/2014, 06/07/2021   Zoster Recombinat (Shingrix) 06/13/2018, 08/13/2018   Zoster, Live 08/13/2018    TDAP status: Up to date  Flu Vaccine status: Up to date  Pneumococcal vaccine status: Up to date  Covid-19 vaccine status: Completed vaccines  Qualifies for Shingles Vaccine? Yes   Zostavax completed No   Shingrix Completed?: Yes  Screening Tests Health Maintenance  Topic Date Due   INFLUENZA VACCINE  03/17/2022 (Originally 07/18/2021)   Fecal DNA (Cologuard)  04/01/2024   TETANUS/TDAP  06/08/2031   COVID-19 Vaccine  Completed   Hepatitis C Screening  Completed   HIV Screening  Completed   Zoster Vaccines- Shingrix  Completed   HPV VACCINES  Aged Out    Health Maintenance  There are no preventive care reminders to display for this patient.  Colorectal cancer screening: Type of screening: Cologuard. Completed done. Repeat every 3 years  Lung Cancer Screening: (Low Dose CT Chest recommended ifAge 55-80 years, 30 pack-year currently smoking OR have quit w/in 15years.) does qualify.   Lung Cancer Screening Referral: Performed already 08/26/21  negative  Additional Screening:  Hepatitis C Screening: does not qualify; Completed already received completed Tx for Hep C with oral antiviral per [facility]  Vision Screening: Recommended annual ophthalmology exams for early detection of glaucoma and other disorders of the eye. Is the patient up to date with their annual eye exam?  Yes  Who is the provider or what is the name of the office in which the patient attends annual eye exams?  [facility] [place] If pt is not established with a  provider, would they like to be referred to a provider to establish care?  NA .   Dental Screening: Recommended annual dental exams for proper oral hygiene  has  appt 09/17/21 with Darel Hong DDS  Community Resource Referral / Chronic Care Management: CRR required this visit?  Yes   CCM required this visit?  No      Plan:     I have personally reviewed and noted the following in the patient's chart:   Medical and social history Use of alcohol, tobacco or illicit drugs  Current medications and supplements including opioid prescriptions. Patient is not currently taking opioid prescriptions. Functional ability and status Nutritional status Physical activity Advanced directives List of other physicians Hospitalizations, surgeries, and ER visits in previous 12 months Vitals Screenings to include cognitive, depression, and falls Referrals and appointments    In addition, I have reviewed and discussed with patient certain preventive protocols, quality metrics, and best practice recommendations. A written personalized care plan for preventive services as well as general preventive health recommendations were provided to patient.    Follow Up Instructions: Patient knows a follow-up exam will be obtained early December and refills of medications sent to pharmacy  Note this patient just had a lung cancer screening visit and does not need another 1   I discussed the assessment and treatment plan with the patient. The patient was provided an opportunity to ask questions and all were answered. The patient agreed with the plan and demonstrated an understanding of the instructions.   The patient was advised to call back or seek an in-person evaluation if the symptoms worsen or if the condition fails to improve as anticipated.  I provided 30 minutes of non-face-to-face time during this encounter  including  median intraservice time , review of notes, labs, imaging, medications  and explaining  diagnosis and management to the patient .     Asencion Noble, MD   10/03/2021

## 2021-10-03 ENCOUNTER — Ambulatory Visit: Payer: 59 | Attending: Critical Care Medicine | Admitting: Critical Care Medicine

## 2021-10-03 ENCOUNTER — Encounter: Payer: Self-pay | Admitting: Critical Care Medicine

## 2021-10-03 ENCOUNTER — Other Ambulatory Visit: Payer: Self-pay

## 2021-10-03 DIAGNOSIS — Z0000 Encounter for general adult medical examination without abnormal findings: Secondary | ICD-10-CM | POA: Diagnosis not present

## 2021-10-12 ENCOUNTER — Telehealth: Payer: Self-pay

## 2021-10-12 ENCOUNTER — Institutional Professional Consult (permissible substitution): Payer: 59 | Admitting: Clinical

## 2021-10-12 NOTE — Telephone Encounter (Signed)
patient called advised he was going somewhere else he didn't like his care here

## 2021-10-12 NOTE — Telephone Encounter (Signed)
Patient called in stating he wants all appointments canceled left no reasoning as to why. He hung up on scheduler and says he doesn't want to talk

## 2021-10-26 ENCOUNTER — Encounter: Payer: 59 | Admitting: Critical Care Medicine

## 2021-10-26 ENCOUNTER — Institutional Professional Consult (permissible substitution): Payer: 59 | Admitting: Clinical

## 2021-10-27 ENCOUNTER — Encounter: Payer: Medicare Other | Admitting: Cardiology

## 2021-10-28 ENCOUNTER — Encounter (HOSPITAL_COMMUNITY): Payer: Medicare Other | Admitting: Internal Medicine

## 2021-10-28 ENCOUNTER — Encounter (HOSPITAL_COMMUNITY): Payer: Self-pay

## 2021-11-23 DIAGNOSIS — J410 Simple chronic bronchitis: Secondary | ICD-10-CM | POA: Insufficient documentation

## 2021-12-08 DIAGNOSIS — D751 Secondary polycythemia: Secondary | ICD-10-CM | POA: Insufficient documentation

## 2021-12-08 DIAGNOSIS — E559 Vitamin D deficiency, unspecified: Secondary | ICD-10-CM | POA: Insufficient documentation

## 2022-01-13 ENCOUNTER — Encounter (HOSPITAL_COMMUNITY): Payer: Self-pay

## 2022-01-13 ENCOUNTER — Encounter (HOSPITAL_COMMUNITY): Payer: 59 | Admitting: Internal Medicine

## 2022-01-17 NOTE — Progress Notes (Deleted)
Subjective:  Chief complaint:   Patient ID: Ryan Long, male    DOB: 06-13-1961, 61 y.o.   MRN: [id number]  HPI   61  year old man with HIV, host of other medical problems including nonischemic cardiomyopathy, ICD placement prior history of polydrug substance abuse but reasonable control of his HIV virus   He had some cervical LA when I saw him last May of 2022  He was recently seen in ER for dyspnea and established care with Dr. Delford Field  He has AICD, CHF with low ER, HTN, hx of smoking, PTSD     Past Medical History:  Diagnosis Date   Active smoker    AICD (automatic cardioverter/defibrillator) present    Alcohol abuse    Anxiety    AR (allergic rhinitis)    Bipolar 1 disorder (HCC)    Cervical lymphadenitis 04/20/2021   CHF (congestive heart failure) (HCC)    Chronic bronchitis (HCC)    Chronic systolic heart failure (HCC)    Controlled substance agreement signed 10/22/2018   COPD (chronic obstructive pulmonary disease) (HCC)    Cough 12/31/2018   Crack cocaine use    Depression    Genital herpes    HIV (human immunodeficiency virus infection) (HCC) dx'd 08/1993   HLD (hyperlipidemia)    Hypertension    NICM (nonischemic cardiomyopathy) (HCC)    Echocardiogram 06/28/11: EF 30-35%, mild MR, mild LAE;  No CAD by coronary CT angiogram 3/12 at [facility]   NSVT (nonsustained ventricular tachycardia)    PTSD (post-traumatic stress disorder)     Past Surgical History:  Procedure Laterality Date   CARDIAC DEFIBRILLATOR PLACEMENT  01/08/2018   ICD IMPLANT N/A 01/08/2018   Procedure: ICD IMPLANT;  Surgeon: Regan Lemming, MD;  Location: [facility] INVASIVE CV LAB;  Service: Cardiovascular;  Laterality: N/A;    Family History  Problem Relation Age of Onset   Glaucoma Mother    Mental illness Mother    Vision loss Mother    Hypertension Father    CAD Father    Mental illness Father    Alcohol abuse Father    Drug abuse Father    Heart attack  Father 27   Early death Father    Alcohol abuse Brother    Drug abuse Brother    Drug abuse Brother       Social History   Socioeconomic History   Marital status: Single    Spouse name: Not on file   Number of children: Not on file   Years of education: Not on file   Highest education level: Not on file  Occupational History   Not on file  Tobacco Use   Smoking status: Every Day    Packs/day: 0.75    Years: 38.00    Pack years: 28.50    Types: Cigarettes   Smokeless tobacco: Never   Tobacco comments:    hoping welbutrin will help  Vaping Use   Vaping Use: Never used  Substance and Sexual Activity   Alcohol use: Not Currently    Alcohol/week: 6.0 standard drinks    Types: 6 Cans of beer per week    Comment: 01/08/2018 "stopped 06/2017"   Drug use: Not Currently    Types: Cocaine    Comment: 01/08/2018 "stopped 06/2017"   Sexual activity: Yes    Birth control/protection: Condom    Comment: pt. declined condoms  Other Topics Concern   Not on file  Social History Narrative   Not  on file   Social Determinants of Health   Financial Resource Strain: Not on file  Food Insecurity: Not on file  Transportation Needs: Not on file  Physical Activity: Not on file  Stress: Not on file  Social Connections: Not on file    No Known Allergies   Current Outpatient Medications:    aspirin EC 81 MG tablet, Take 81 mg by mouth daily. Swallow whole., Disp: , Rfl:    bictegravir-emtricitabine-tenofovir AF (BIKTARVY) 50-200-25 MG TABS tablet, Take 1 tablet by mouth daily., Disp: 30 tablet, Rfl: 5   eplerenone (INSPRA) 25 MG tablet, Take 0.5 tablets (12.5 mg total) by mouth daily., Disp: 15 tablet, Rfl: 11   fluticasone furoate-vilanterol (BREO ELLIPTA) 100-25 MCG/INH AEPB, Inhale 1 puff into the lungs daily., Disp: 60 each, Rfl: 4   hydrOXYzine (ATARAX/VISTARIL) 25 MG tablet, TAKE 1-2 TABLETS BY MOUTH 2 TIMES DAILY AS NEEDED FOR ANXIETY. (Patient taking differently: Take 25-50 mg by  mouth 2 (two) times daily as needed for anxiety.), Disp: 60 tablet, Rfl: 3   Melatonin 10 MG CAPS, Take 10 mg by mouth at bedtime as needed (sleep)., Disp: , Rfl:    metoprolol succinate (TOPROL-XL) 50 MG 24 hr tablet, Take 1 tablet (50 mg total) by mouth daily. For high blood pressure, Disp: 90 tablet, Rfl: 3   Multiple Vitamins-Minerals (MENS MULTIPLUS PO), Take 1 tablet by mouth daily., Disp: , Rfl:    nicotine (NICOTINE STEP 3) 7 mg/24hr patch, Place 1 patch (7 mg total) onto the skin daily., Disp: 28 patch, Rfl: 0   potassium chloride SA (KLOR-CON) 20 MEQ tablet, Take 1 tablet (20 mEq total) by mouth daily., Disp: 90 tablet, Rfl: 3   sacubitril-valsartan (ENTRESTO) 24-26 MG, Take 1 tablet by mouth 2 (two) times daily., Disp: 180 tablet, Rfl: 3   sildenafil (VIAGRA) 50 MG tablet, Take 50 mg by mouth as needed for erectile dysfunction., Disp: , Rfl:    tamsulosin (FLOMAX) 0.4 MG CAPS capsule, TAKE 2 CAPSULES(0.8MG  TOTAL) BY MOUTH EVERY DAY AFTER SUPPER FOR PROSTATE HEALTH (Patient taking differently: Take 0.8 mg by mouth daily after supper.), Disp: 60 capsule, Rfl: 2   traZODone (DESYREL) 100 MG tablet, Take 1 tablet (100 mg total) by mouth at bedtime as needed for sleep., Disp: 30 tablet, Rfl: 3   valACYclovir (VALTREX) 500 MG tablet, TAKE 1 TABLET BY MOUTH EVERY DAY FOR HERPES (Patient taking differently: Take 500 mg by mouth daily.), Disp: 90 tablet, Rfl: 4   Review of Systems     Objective:   Physical Exam        Assessment & Plan:   HIV disease:  I am rechecking HIV VL, CD4 CMP w GFR, CBC w diff  I will continue his  Biktarvy rx   Nonischemic cardiomyopathy with ICD:  He is followed by Cardiology,EP and PCP  He will continue his metoprolol and eplernone   Smoking   Vaccine counselling:  Recommended that he receive Prevnar 20, COVID bivalent booster and annual flu shot.

## 2022-01-18 ENCOUNTER — Ambulatory Visit: Payer: 59 | Admitting: Infectious Disease

## 2022-01-18 DIAGNOSIS — Z95810 Presence of automatic (implantable) cardiac defibrillator: Secondary | ICD-10-CM

## 2022-01-18 DIAGNOSIS — F1410 Cocaine abuse, uncomplicated: Secondary | ICD-10-CM

## 2022-01-18 DIAGNOSIS — I159 Secondary hypertension, unspecified: Secondary | ICD-10-CM

## 2022-01-18 DIAGNOSIS — B20 Human immunodeficiency virus [HIV] disease: Secondary | ICD-10-CM

## 2022-01-18 DIAGNOSIS — F323 Major depressive disorder, single episode, severe with psychotic features: Secondary | ICD-10-CM

## 2022-01-18 DIAGNOSIS — F1029 Alcohol dependence with unspecified alcohol-induced disorder: Secondary | ICD-10-CM

## 2022-01-18 DIAGNOSIS — B009 Herpesviral infection, unspecified: Secondary | ICD-10-CM

## 2022-01-18 DIAGNOSIS — N401 Enlarged prostate with lower urinary tract symptoms: Secondary | ICD-10-CM

## 2022-01-18 DIAGNOSIS — E785 Hyperlipidemia, unspecified: Secondary | ICD-10-CM

## 2022-01-18 DIAGNOSIS — I4891 Unspecified atrial fibrillation: Secondary | ICD-10-CM

## 2022-01-22 NOTE — Progress Notes (Deleted)
Cardiology Office Note Date:  01/22/2022  Patient ID:  Ryan Long, [place] [birth year], MRN [id number] PCP:  Elsie Stain, MD  Cardiologist:  recently Dr. Minna Merritts Electrophysiologist: Dr. Curt Bears for [facility]  ***refresh   Chief Complaint: *** over due visit  History of Present Illness: Ryan Long is a 61 y.o. male with history of NICM (suspect 2/2 substance abuse vs HIV), ICD, HIV, HTN, major depressive disorder  He saw A. Sula Rumple Sept 2022, for ICD shock, unclear exactly what he was doing/how he was taking his medicines, had been to the ER but left AMA with L arm stiffening EGMs with some irregularity with ? If AF though not known for him he denied ongoing drug use He was not sure who was managing his remotes, [facility] or [facility] Was pending CPX  Seems he has been or is followed to some degree by a number of cardiologists/EP services [facility], [facility], [place], [place], and more recently [facility]  He saw Dr. Minna Merritts most recently 11/18/21 for new patient consult, reports referred for ICD therapies, and mentions of noncompliance with other MDs. Perhaps ICD shock inappropriate for Afib? Planned to update echo, ischemic eval planned Discussed possibility of upgrading his device and ablating his AV node, perhaps if AF an issue (this would be new)   *** where is he going to follow? *** meds, CM *** volume *** rhythm??? Monitor?  Afib?  Device information Abbott single chamber ICD, implanted  01/08/2018   Past Medical History:  Diagnosis Date   Active smoker    AICD (automatic cardioverter/defibrillator) present    Alcohol abuse    Anxiety    AR (allergic rhinitis)    Bipolar 1 disorder (HCC)    Cervical lymphadenitis 04/20/2021   CHF (congestive heart failure) (HCC)    Chronic bronchitis (HCC)    Chronic systolic heart failure (HCC)    Controlled substance agreement signed 10/22/2018   COPD (chronic obstructive pulmonary disease) ([place])    Cough  12/31/2018   Crack cocaine use    Depression    Genital herpes    HIV (human immunodeficiency virus infection) ([place]) dx'd 08/1993   HLD (hyperlipidemia)    Hypertension    NICM (nonischemic cardiomyopathy) ([place])    Echocardiogram 06/28/11: EF 30-35%, mild MR, mild LAE;  No CAD by coronary CT angiogram 3/12 at [facility]   NSVT (nonsustained ventricular tachycardia)    PTSD (post-traumatic stress disorder)     Past Surgical History:  Procedure Laterality Date   CARDIAC DEFIBRILLATOR PLACEMENT  01/08/2018   ICD IMPLANT N/A 01/08/2018   Procedure: ICD IMPLANT;  Surgeon: Constance Haw, MD;  Location: [place] [facility];  Service: Cardiovascular;  Laterality: N/A;    Current Outpatient Medications  Medication Sig Dispense Refill   aspirin EC 81 MG tablet Take 81 mg by mouth daily. Swallow whole.     bictegravir-emtricitabine-tenofovir AF (BIKTARVY) 50-200-25 MG TABS tablet Take 1 tablet by mouth daily. 30 tablet 5   eplerenone (INSPRA) 25 MG tablet Take 0.5 tablets (12.5 mg total) by mouth daily. 15 tablet 11   fluticasone furoate-vilanterol (BREO ELLIPTA) 100-25 MCG/INH AEPB Inhale 1 puff into the lungs daily. 60 each 4   hydrOXYzine (ATARAX/VISTARIL) 25 MG tablet TAKE 1-2 TABLETS BY MOUTH 2 TIMES DAILY AS NEEDED FOR ANXIETY. (Patient taking differently: Take 25-50 mg by mouth 2 (two) times daily as needed for anxiety.) 60 tablet 3   Melatonin 10 MG CAPS Take 10 mg  by mouth at bedtime as needed (sleep).     metoprolol succinate (TOPROL-XL) 50 MG 24 hr tablet Take 1 tablet (50 mg total) by mouth daily. For high blood pressure 90 tablet 3   Multiple Vitamins-Minerals (MENS MULTIPLUS PO) Take 1 tablet by mouth daily.     nicotine (NICOTINE STEP 3) 7 mg/24hr patch Place 1 patch (7 mg total) onto the skin daily. 28 patch 0   potassium chloride SA (KLOR-CON) 20 MEQ tablet Take 1 tablet (20 mEq total) by mouth daily. 90 tablet 3   sacubitril-valsartan (ENTRESTO) 24-26 MG Take  1 tablet by mouth 2 (two) times daily. 180 tablet 3   sildenafil (VIAGRA) 50 MG tablet Take 50 mg by mouth as needed for erectile dysfunction.     tamsulosin (FLOMAX) 0.4 MG CAPS capsule TAKE 2 CAPSULES(0.8MG  TOTAL) BY MOUTH EVERY DAY AFTER SUPPER FOR PROSTATE HEALTH (Patient taking differently: Take 0.8 mg by mouth daily after supper.) 60 capsule 2   traZODone (DESYREL) 100 MG tablet Take 1 tablet (100 mg total) by mouth at bedtime as needed for sleep. 30 tablet 3   valACYclovir (VALTREX) 500 MG tablet TAKE 1 TABLET BY MOUTH EVERY DAY FOR HERPES (Patient taking differently: Take 500 mg by mouth daily.) 90 tablet 4   No current facility-administered medications for this visit.    Allergies:   Patient has no known allergies.   Social History:  The patient  reports that he has been smoking cigarettes. He has a 28.50 pack-year smoking history. He has never used smokeless tobacco. He reports that he does not currently use alcohol after a past usage of about 6.0 standard drinks per week. He reports that he does not currently use drugs after having used the following drugs: Cocaine.   Family History:  The patient's family history includes Alcohol abuse in his brother and father; CAD in his father; Drug abuse in his brother, brother, and father; Early death in his father; Glaucoma in his mother; Heart attack (age of onset: 72) in his father; Hypertension in his father; Mental illness in his father and mother; Vision loss in his mother.  ROS:  Please see the history of present illness.    All other systems are reviewed and otherwise negative.   PHYSICAL EXAM:  VS:  There were no vitals taken for this visit. BMI: There is no height or weight on file to calculate BMI. Well nourished, well developed, in no acute distress HEENT: normocephalic, atraumatic Neck: no JVD, carotid bruits or masses Cardiac:  *** RRR; no significant murmurs, no rubs, or gallops Lungs:  *** CTA b/l, no wheezing, rhonchi or  rales Abd: soft, nontender MS: no deformity or *** atrophy Ext: *** no edema Skin: warm and dry, no rash Neuro:  No gross deficits appreciated Psych: euthymic mood, full affect  ***ICD site is stable, no tethering or discomfort   EKG:  Done today and reviewed by myself shows  ***  Device interrogation done today and reviewed by myself:  ***  01/04/22: lexiscan stress myoview IMPRESSION:  1.  Images are consistent with dilated cardiomyopathy with a fixed  inferior wall defect and no significant reversibility.  Of interest is the  fact that there is significant reverse redistribution of the septal wall  and anterior wall which is of unknown clinical significance   2.  Calculated ejection fraction 17%   3.  Dilated cardiomyopathy with an ejection fraction of 17% calculated and  a fixed defect of the inferior wall without  significant reversibility.    12/13/21: TTE SUMMARY  CPS 02/26/14 EF has declined.  Echo contrast study was not performed due to the patient refusal.  Ectopy during study.  There is mild concentric left ventricular hypertrophy.  The left ventricular size is normal.  LV ejection fraction = 25-30%.  Left ventricular systolic function is severely reduced.  Left ventricular filling pattern is indeterminate.  There is severe global hypokinesis of the left ventricle.  Device lead in the right ventricle  The right ventricle is mildly dilated.  The left atrium is mildly dilated.  The right atrium is mildly dilated.  There is mild mitral regurgitation.  There was insufficient TR detected to calculate RV systolic pressure.  There is a pacemaker lead in the right ventricle.    04/20/2021: TTE  1. Left ventricular ejection fraction, by estimation, is 20 to 25%. The  left ventricle has severely decreased function. The left ventricle has no  regional wall motion abnormalities. The left ventricular internal cavity  size was mildly to moderately  dilated. Left  ventricular diastolic parameters are consistent with Grade I  diastolic dysfunction (impaired relaxation).   2. Right ventricular systolic function is normal. The right ventricular  size is normal.   3. The mitral valve is normal in structure. Mild mitral valve  regurgitation. No evidence of mitral stenosis.   4. The aortic valve is normal in structure. Aortic valve regurgitation is  not visualized. No aortic stenosis is present.   5. The inferior vena cava is normal in size with greater than 50%  respiratory variability, suggesting right atrial pressure of 3 mmHg.    Recent Labs: 09/06/2021: ALT 21; NT-Pro BNP 755; TSH 0.217 09/26/2021: B Natriuretic Peptide 668.2; BUN 13; Creatinine, Ser 1.38; Hemoglobin 17.9; Magnesium 2.2; Platelets 173; Potassium 3.9; Sodium 137  03/10/2021: Cholesterol 162; HDL 49; LDL Cholesterol (Calc) 96; Total CHOL/HDL Ratio 3.3; Triglycerides 76   CrCl cannot be calculated (Patient's most recent lab result is older than the maximum 21 days allowed.).   Wt Readings from Last 3 Encounters:  09/26/21 204 lb (92.5 kg)  09/12/21 210 lb (95.3 kg)  09/06/21 211 lb 9.6 oz (96 kg)     Other studies reviewed: Additional studies/records reviewed today include: summarized above  ASSESSMENT AND PLAN:  ICD ***  NICM ***  VT ? AF ***  Disposition: F/u with ***  Current medicines are reviewed at length with the patient today.  The patient did not have any concerns regarding medicines.  Venetia Night, PA-C 01/22/2022 9:29 AM     [address] [place]  [phone] (office)  [phone] (fax)

## 2022-01-25 ENCOUNTER — Encounter: Payer: 59 | Admitting: Physician Assistant

## 2022-01-26 DIAGNOSIS — Z9109 Other allergy status, other than to drugs and biological substances: Secondary | ICD-10-CM | POA: Insufficient documentation

## 2022-01-26 DIAGNOSIS — H6981 Other specified disorders of Eustachian tube, right ear: Secondary | ICD-10-CM | POA: Insufficient documentation

## 2022-01-26 DIAGNOSIS — F17210 Nicotine dependence, cigarettes, uncomplicated: Secondary | ICD-10-CM | POA: Insufficient documentation

## 2022-03-27 ENCOUNTER — Telehealth: Payer: Self-pay | Admitting: Critical Care Medicine

## 2022-03-27 ENCOUNTER — Ambulatory Visit: Payer: Self-pay

## 2022-03-27 NOTE — Telephone Encounter (Signed)
Copied from CRM [id number]. Topic: Appointment Scheduling - Scheduling Inquiry for Clinic ?>> Mar 24, 2022  4:52 PM Pawlus, Maxine Glenn A wrote: ?Reason for CRM: Pt called in wanting to schedule an appointment with the social worker, please advise. ?

## 2022-03-27 NOTE — Telephone Encounter (Signed)
?  Chief Complaint: depression ?Symptoms: depression worsening, pt had SI and thought of plan but denies being current ?Frequency: 1 month ?Pertinent Negatives: NA ?Disposition: [x] ED /[] Urgent Care (no appt availability in office) / [] Appointment(In office/virtual)/ []  [place] [facility]/ [] Home Care/ [] Refused Recommended Disposition /[] [place] [facility]/ []  Follow-up with PCP ?Additional Notes: Pt states he has had depression get worse and is to where he didn't think it'd be this bad. Pt has no support system. Since he did say he had thoughts but wasn't present I advised him going to [facility] ED would be best for pt. Unable to get appt until 03/29/22 and states to pt that I feel like [facility] ED would be safer option. Pt states he would try to get the energy to go. I advised him if thoughts came back to call and speak with one of our nurses. Pt verbalized understanding.  ? ? ?Reason for Disposition ? Sometimes has thoughts of suicide ? ?Answer Assessment - Initial Assessment Questions ?2. DEPRESSION SYMPTOM SCREENING: "How are you feeling overall?" (e.g., decreased energy, increased sleeping or difficulty sleeping, difficulty concentrating, feelings of sadness, guilt, hopelessness, or worthlessness) ?    Trouble sleeping, losing weight, don't have energy, moody  ?3. RISK OF HARM - SUICIDAL IDEATION:  "Do you ever have thoughts of hurting or killing yourself?"  (e.g., yes, no, no but preoccupation with thoughts about death) ?  - INTENT:  "Do you have thoughts of hurting or killing yourself right NOW?" (e.g., yes, no, N/A) ?  - PLAN: "Do you have a specific plan for how you would do this?" (e.g., gun, knife, overdose, no plan, N/A) ?    Had thoughts, denies currently, has came up with a plan  ?4. RISK OF HARM - HOMICIDAL IDEATION:  "Do you ever have thoughts of hurting or killing someone else?"  (e.g., yes, no, no but preoccupation with thoughts about death) ?  - INTENT:  "Do you have thoughts of hurting or killing  someone right NOW?" (e.g., yes, no, N/A) ?  - PLAN: "Do you have a specific plan for how you would do this?" (e.g., gun, knife, no plan, N/A)  ?    No ?5. FUNCTIONAL IMPAIRMENT: "How have things been going for you overall? Have you had more difficulty than usual doing your normal daily activities?"  (e.g., better, same, worse; self-care, school, work, interactions) ?    Worse and affecting self care  ?6. SUPPORT: "Who is with you now?" "Who do you live with?" "Do you have family or friends who you can talk to?"  ?    No ?7. THERAPIST: "Do you have a counselor or therapist? Name?" ?    no ?8. STRESSORS: "Has there been any new stress or recent changes in your life?" ?    no ?9. ALCOHOL USE OR SUBSTANCE USE (DRUG USE): "Do you drink alcohol or use any illegal drugs?" ?    No ?10. OTHER: "Do you have any other physical symptoms right now?" (e.g., fever) ?      no ? ?Protocols used: Depression-A-AH ? ?

## 2022-03-28 NOTE — Telephone Encounter (Signed)
Patient did not answer. LVM to offer appointment for patient.

## 2022-03-28 NOTE — Telephone Encounter (Signed)
Virtual or in person? Patient has not reported to ED as of yet.

## 2022-03-30 ENCOUNTER — Other Ambulatory Visit: Payer: Medicare Other

## 2022-03-30 ENCOUNTER — Other Ambulatory Visit: Payer: Self-pay

## 2022-03-30 DIAGNOSIS — B20 Human immunodeficiency virus [HIV] disease: Secondary | ICD-10-CM

## 2022-03-30 DIAGNOSIS — Z113 Encounter for screening for infections with a predominantly sexual mode of transmission: Secondary | ICD-10-CM

## 2022-03-30 NOTE — Addendum Note (Signed)
Addended by: Marcell Anger on: 03/30/2022 08:48 AM ? ? Modules accepted: Orders ? ?

## 2022-04-01 NOTE — Telephone Encounter (Signed)
Needs to be seen this week for depression, did not show at ED. ? 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

## 2022-04-03 NOTE — Telephone Encounter (Signed)
There is a appt for his depression  for next week with [place]  ?

## 2022-04-04 ENCOUNTER — Ambulatory Visit (INDEPENDENT_AMBULATORY_CARE_PROVIDER_SITE_OTHER): Payer: Medicare Other

## 2022-04-04 DIAGNOSIS — I428 Other cardiomyopathies: Secondary | ICD-10-CM

## 2022-04-04 LAB — CUP PACEART REMOTE DEVICE CHECK
Battery Remaining Longevity: 59 mo
Battery Remaining Percentage: 58 %
Battery Voltage: 2.95 V
Brady Statistic RV Percent Paced: 1 %
Date Time Interrogation Session: 20230418101909
HighPow Impedance: 66 Ohm
HighPow Impedance: 66 Ohm
Implantable Lead Implant Date: 20190122
Implantable Lead Location: 753860
Implantable Pulse Generator Implant Date: 20190122
Lead Channel Impedance Value: 400 Ohm
Lead Channel Pacing Threshold Amplitude: 0.5 V
Lead Channel Pacing Threshold Pulse Width: 0.5 ms
Lead Channel Sensing Intrinsic Amplitude: 6.5 mV
Lead Channel Setting Pacing Amplitude: 2 V
Lead Channel Setting Pacing Pulse Width: 0.5 ms
Lead Channel Setting Sensing Sensitivity: 0.5 mV
Pulse Gen Serial Number: 7376107

## 2022-04-06 ENCOUNTER — Other Ambulatory Visit: Payer: Medicare Other

## 2022-04-13 ENCOUNTER — Ambulatory Visit: Payer: Medicare Other | Admitting: Physician Assistant

## 2022-04-13 ENCOUNTER — Ambulatory Visit: Payer: 59 | Admitting: Infectious Disease

## 2022-04-14 NOTE — Telephone Encounter (Signed)
I attempted to call pt, no answer, left detailed message informing pt that I am leaving the clinic. I will send counseling resources to pt's chart.  ?

## 2022-04-18 ENCOUNTER — Other Ambulatory Visit: Payer: Medicare Other

## 2022-04-19 ENCOUNTER — Encounter (HOSPITAL_COMMUNITY): Payer: 59

## 2022-04-20 ENCOUNTER — Ambulatory Visit: Payer: Medicare Other | Admitting: Infectious Disease

## 2022-04-21 NOTE — Progress Notes (Signed)
Remote ICD transmission.   

## 2022-04-24 ENCOUNTER — Encounter: Payer: 59 | Admitting: Student

## 2022-05-02 ENCOUNTER — Ambulatory Visit: Payer: Medicare Other | Admitting: Infectious Disease

## 2022-05-08 ENCOUNTER — Other Ambulatory Visit: Payer: Self-pay | Admitting: Critical Care Medicine

## 2022-05-09 NOTE — Telephone Encounter (Signed)
Will forward to provider  

## 2022-05-29 ENCOUNTER — Ambulatory Visit: Payer: 59 | Admitting: Critical Care Medicine

## 2022-06-05 ENCOUNTER — Other Ambulatory Visit: Payer: Self-pay | Admitting: Student

## 2022-06-06 ENCOUNTER — Ambulatory Visit (INDEPENDENT_AMBULATORY_CARE_PROVIDER_SITE_OTHER): Payer: Medicare Other | Admitting: Pharmacist

## 2022-06-06 ENCOUNTER — Other Ambulatory Visit (HOSPITAL_COMMUNITY)
Admission: RE | Admit: 2022-06-06 | Discharge: 2022-06-06 | Disposition: A | Discharge status: Home / Self Care | Payer: Medicare Other | Source: Ambulatory Visit | Attending: Infectious Disease | Admitting: Infectious Disease

## 2022-06-06 ENCOUNTER — Other Ambulatory Visit: Payer: Self-pay

## 2022-06-06 DIAGNOSIS — Z113 Encounter for screening for infections with a predominantly sexual mode of transmission: Secondary | ICD-10-CM | POA: Insufficient documentation

## 2022-06-06 DIAGNOSIS — I5022 Chronic systolic (congestive) heart failure: Secondary | ICD-10-CM

## 2022-06-06 DIAGNOSIS — B20 Human immunodeficiency virus [HIV] disease: Secondary | ICD-10-CM | POA: Diagnosis present

## 2022-06-06 MED ORDER — ENTRESTO 24-26 MG PO TABS: 1.0000 | ORAL_TABLET | Freq: Two times a day (BID) | ORAL | 1 refills | Status: DC

## 2022-06-06 NOTE — Progress Notes (Signed)
06/07/2022  HPI: Ryan Long is a 61 y.o. male who presents to the [place] clinic for HIV follow-up.  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   AICD (automatic cardioverter/defibrillator) present 08/15/2018   Abnormal EKG 06/28/2018   Adjustment disorder with mixed disturbance of emotions and conduct    NICM (nonischemic cardiomyopathy) ([place]) 01/08/2018   Anxiety 12/20/2017   Bipolar 1 disorder ([place]) 0000000   Chronic systolic heart failure ([place]) 05/13/2017   HSV-2 infection 07/17/2016   History of attempted suicide 04/14/2016   History of alcoholism ([place]) 04/14/2016   History of substance abuse ([place]) 04/14/2016   Constipation 01/17/2016   Chronic obstructive pulmonary disease ([place]) 10/04/2015   Abnormal thyroid stimulating hormone (TSH) level 10/04/2015   MDD (major depressive disorder), recurrent episode, severe ([place]) 09/23/2015   Hypertension 09/10/2015   PTSD (post-traumatic stress disorder) 08/07/2015   Primary insomnia 04/02/2015   Herpesviral infection of penis 04/02/2015   Generalized anxiety disorder 05/29/2014   Tobacco use disorder 02/17/2013   Heart failure ([place]) 09/19/2011   Other primary cardiomyopathies 08/24/2011   Human immunodeficiency virus (HIV) disease ([place]) 02/01/2009   Recurrent HSV (herpes simplex virus) 02/01/2009   Hyperlipidemia 02/01/2009   Allergic rhinitis 02/01/2009    Patient's Medications  New Prescriptions   No medications on file  Previous Medications   ASPIRIN EC 81 MG TABLET    Take 81 mg by mouth daily. Swallow whole.   BICTEGRAVIR-EMTRICITABINE-TENOFOVIR AF (BIKTARVY) 50-200-25 MG TABS TABLET    Take 1  tablet by mouth daily.   BUPROPION (WELLBUTRIN) 100 MG TABLET    Take by mouth.   EPLERENONE (INSPRA) 25 MG TABLET    Take 0.5 tablets (12.5 mg total) by mouth daily.   FLUTICASONE FUROATE-VILANTEROL (BREO ELLIPTA) 100-25 MCG/INH AEPB    Inhale 1 puff into the lungs daily.   FUROSEMIDE (LASIX) 20 MG TABLET    Take 1 tablet by mouth daily.   HYDROXYZINE (ATARAX/VISTARIL) 25 MG TABLET    TAKE 1-2 TABLETS BY MOUTH 2 TIMES DAILY AS NEEDED FOR ANXIETY.   MELATONIN 10 MG CAPS    Take 10 mg by mouth at bedtime as needed (sleep).   METOPROLOL SUCCINATE (TOPROL-XL) 50 MG 24 HR TABLET    Take 1 tablet (50 mg total) by mouth daily. For high blood pressure   MULTIPLE VITAMINS-MINERALS (MENS MULTIPLUS PO)    Take 1 tablet by mouth daily.   NICOTINE (NICODERM CQ - DOSED IN MG/24 HOURS) 21 MG/24HR PATCH    Place 21 mg onto the skin daily.   POTASSIUM CHLORIDE SA (KLOR-CON M) 20 MEQ TABLET    TAKE 1 TABLET BY MOUTH EVERY DAY   ROSUVASTATIN (CRESTOR) 5 MG TABLET    Take 1 tablet by mouth daily.   SILDENAFIL (VIAGRA) 50 MG TABLET    Take 50 mg by mouth as needed for erectile dysfunction.   TAMSULOSIN (FLOMAX) 0.4 MG CAPS CAPSULE    TAKE 2 CAPSULES(0.8MG  TOTAL) BY MOUTH EVERY DAY AFTER SUPPER FOR PROSTATE HEALTH   TRAZODONE (DESYREL) 100 MG TABLET    TAKE 1 TABLET BY MOUTH AT BEDTIME AS NEEDED FOR SLEEP   VALACYCLOVIR (VALTREX) 500 MG TABLET  TAKE 1 TABLET BY MOUTH EVERY DAY FOR HERPES  Modified Medications   Modified Medication Previous Medication   SACUBITRIL-VALSARTAN (ENTRESTO) 24-26 MG sacubitril-valsartan (ENTRESTO) 24-26 MG      Take 1 tablet by mouth 2 (two) times daily.    Take 1 tablet by mouth 2 (two) times daily.  Discontinued Medications   NICOTINE (NICOTINE STEP 3) 7 MG/24HR PATCH    Place 1 patch (7 mg total) onto the skin daily.    Allergies: No Known Allergies  Past Medical History: Past Medical History:  Diagnosis Date   Active smoker    AICD (automatic cardioverter/defibrillator)  present    Alcohol abuse    Anxiety    AR (allergic rhinitis)    Bipolar 1 disorder (HCC)    Cervical lymphadenitis 04/20/2021   CHF (congestive heart failure) (HCC)    Chronic bronchitis (HCC)    Chronic systolic heart failure (HCC)    Controlled substance agreement signed 10/22/2018   COPD (chronic obstructive pulmonary disease) (HCC)    Cough 12/31/2018   Crack cocaine use    Depression    Genital herpes    HIV (human immunodeficiency virus infection) (HCC) dx'd 08/1993   HLD (hyperlipidemia)    Hypertension    NICM (nonischemic cardiomyopathy) (HCC)    Echocardiogram 06/28/11: EF 30-35%, mild MR, mild LAE;  No CAD by coronary CT angiogram 3/12 at [facility]   NSVT (nonsustained ventricular tachycardia)    PTSD (post-traumatic stress disorder)     Social History: Social History   Socioeconomic History   Marital status: Single    Spouse name: Not on file   Number of children: Not on file   Years of education: Not on file   Highest education level: Not on file  Occupational History   Not on file  Tobacco Use   Smoking status: Every Day    Packs/day: 0.75    Years: 38.00    Total pack years: 28.50    Types: Cigarettes   Smokeless tobacco: Never   Tobacco comments:    hoping welbutrin will help  Vaping Use   Vaping Use: Never used  Substance and Sexual Activity   Alcohol use: Not Currently    Alcohol/week: 6.0 standard drinks of alcohol    Types: 6 Cans of beer per week    Comment: 01/08/2018 "stopped 06/2017"   Drug use: Not Currently    Types: Cocaine    Comment: 01/08/2018 "stopped 06/2017"   Sexual activity: Yes    Birth control/protection: Condom    Comment: pt. declined condoms  Other Topics Concern   Not on file  Social History Narrative   Not on file   Social Determinants of Health   Financial Resource Strain: Not on file  Food Insecurity: Not on file  Transportation Needs: Not on file  Physical Activity: Not on file  Stress: Not on  file  Social Connections: Not on file    Labs: Lab Results  Component Value Date   HIV1RNAQUANT Not Detected 04/20/2021   HIV1RNAQUANT Not Detected 03/10/2021   HIV1RNAQUANT <20 DETECTED (A) 03/08/2020   CD4TABS 537 03/10/2021   CD4TABS 537 03/08/2020   CD4TABS 420 12/06/2018    RPR and STI Lab Results  Component Value Date   LABRPR NON-REACTIVE 03/10/2021   LABRPR NON REACTIVE 08/07/2020   LABRPR NON-REACTIVE 03/08/2020   LABRPR NON-REACTIVE 12/31/2018   LABRPR NON-REACTIVE 12/06/2018    STI Results GC GC CT CT  08/01/2021  8:37 AM Negative  Negative    03/18/2021  7:30 AM Negative   Negative    11/15/2020  8:51 AM Negative   Negative    08/07/2020  9:13 PM Negative   Negative    05/21/2019 12:00 AM Negative   Negative    01/04/2019 12:00 AM Negative   Negative    12/31/2018 12:00 AM Negative    Negative   Negative    Negative    12/06/2018 12:00 AM Negative   Negative    02/23/2018 12:00 AM Negative   Negative    11/13/2016 12:00 AM Negative   Negative    06/15/2016  3:43 PM  NOT DETECTED   NOT DETECTED   04/18/2016 12:00 AM Negative   Negative    12/11/2015 12:00 AM Negative   Negative    10/13/2015 12:00 AM Negative   Negative    09/27/2015 12:00 AM Negative   Negative    08/04/2015 12:00 AM Negative   Negative    06/04/2015 12:00 AM Negative   Negative    12/10/2014  9:00 AM  NEGATIVE   NEGATIVE   09/01/2014  1:06 AM  NEGATIVE   NEGATIVE   03/29/2012  9:42 PM   NEGATIVE      Hepatitis B Lab Results  Component Value Date   HEPBSAB INDETER (A) 02/08/2009   HEPBSAG NEG 02/08/2009   Hepatitis C No results found for: "HEPCAB", "HCVRNAPCRQN" Hepatitis A No results found for: "HAV" Lipids: Lab Results  Component Value Date   CHOL 162 03/10/2021   TRIG 76 03/10/2021   HDL 49 03/10/2021   CHOLHDL 3.3 03/10/2021   VLDL 31 06/25/2020   LDLCALC 96 03/10/2021    Current HIV Regimen: Biktarvy  Assessment: Ryan Long is her today to follow up for his  HIV infection after multiple missed appointments. He is also returning to care at [facility] after being seen at [facility] the last few months. Last seen by Dr. Daiva Eves in May 2022. He recently left [facility] due to a bad visit back on June 2nd. He asked them to cancel all of his upcoming appointments. He spent about 25 minutes complaining about their office during this visit. Of note, the last note we have from our clinic is a call to [place] from him requesting a transfer of care because he was unhappy with his care here. I asked him about this and he said he truly cannot remember why he said that or what happened. This was from a telephone note in October 2022.   He is taking Biktarvy every day without any issues or side effects. He brought all of his medications with him today, and I updated him medication list. He was running out of Entresto, so I agreed to send in 2 month's worth until he can see his cardiologist at the end of July. I asked him to please reach out to their office for future refills of this medication.   He is complaining of a rash under his arms. He has tried hydrocortisone for this without any improvement. I asked him to try OTC antifungal cream, lotrimin or clotrimazole, to see if it helps.   He is requesting a dental visit and states that he missed many appointments with them in the past and they wouldn't see him at one point. I had him fill out a new referral form and told him we would try to get him back in. No other issues today. He is requesting to see Dr. Daiva Eves or Dr.  Comer. Will check labs today and have him scheduled with a provider soon for a return to care visit.   Plan: - HIV viral load, CD4, RPR, CMET, CBC, urine cytology today - Fill out dental form - Med rec completed - F/u with Dr. Linus Salmons 6/30 at 2pm  Ademola Vert L. Taelon Bendorf, PharmD, BCIDP, AAHIVP, CPP Clinical Pharmacist Practitioner Infectious [facility] [place] [facility] 06/07/2022, 9:20 AM

## 2022-06-07 ENCOUNTER — Telehealth: Payer: Self-pay

## 2022-06-07 LAB — URINE CYTOLOGY ANCILLARY ONLY
Chlamydia: NEGATIVE
Comment: NEGATIVE
Comment: NORMAL
Neisseria Gonorrhea: NEGATIVE

## 2022-06-07 LAB — T-HELPER CELL (CD4) - (RCID CLINIC ONLY)
CD4 % Helper T Cell: 40 % (ref 33–65)
CD4 T Cell Abs: 382 /uL — ABNORMAL LOW (ref 400–1790)

## 2022-06-07 NOTE — Telephone Encounter (Signed)
Left voicemail asking patient to return my call.   He has appointment with Dr.Comer on 6/30  Ryan Long, CMA

## 2022-06-07 NOTE — Telephone Encounter (Signed)
-----   Message from Randall Hiss, MD sent at 06/07/2022 12:50 PM EDT ----- [name] has acute worsening of his renal function. IS he dehydrated? When is he comign to see me? ----- Message ----- From: Interface, [facility] Lab Results In Sent: 06/06/2022  11:05 PM EDT To: Randall Hiss, MD

## 2022-06-08 NOTE — Telephone Encounter (Signed)
Patient aware. Patient stated that he hasn't been drinking enough water due to it causing him to go to the bathroom frequently but he will increase fluid intake. Patient states that he does take aspirin prescribed by previous cardiologist. Patient aware to hold [facility] and he will he keep an eye on his blood pressure.   Ryan Long, CMA

## 2022-06-09 LAB — COMPLETE METABOLIC PANEL WITH GFR
AG Ratio: 2 (calc) (ref 1.0–2.5)
ALT: 23 U/L (ref 9–46)
AST: 22 U/L (ref 10–35)
Albumin: 4.3 g/dL (ref 3.6–5.1)
Alkaline phosphatase (APISO): 48 U/L (ref 35–144)
BUN/Creatinine Ratio: 11 (calc) (ref 6–22)
BUN: 18 mg/dL (ref 7–25)
CO2: 24 mmol/L (ref 20–32)
Calcium: 9.2 mg/dL (ref 8.6–10.3)
Chloride: 107 mmol/L (ref 98–110)
Creat: 1.59 mg/dL — ABNORMAL HIGH (ref 0.70–1.35)
Globulin: 2.1 g/dL (calc) (ref 1.9–3.7)
Glucose, Bld: 84 mg/dL (ref 65–99)
Potassium: 4.2 mmol/L (ref 3.5–5.3)
Sodium: 142 mmol/L (ref 135–146)
Total Bilirubin: 1.5 mg/dL — ABNORMAL HIGH (ref 0.2–1.2)
Total Protein: 6.4 g/dL (ref 6.1–8.1)
eGFR: 49 mL/min/{1.73_m2} — ABNORMAL LOW (ref >=60)

## 2022-06-09 LAB — HIV-1 RNA QUANT-NO REFLEX-BLD
HIV 1 RNA Quant: NOT DETECTED Copies/mL
HIV-1 RNA Quant, Log: NOT DETECTED Log cps/mL

## 2022-06-09 LAB — CBC WITH DIFFERENTIAL/PLATELET

## 2022-06-09 LAB — RPR: RPR Ser Ql: NONREACTIVE

## 2022-06-16 ENCOUNTER — Ambulatory Visit: Payer: Medicare Other | Admitting: Internal Medicine

## 2022-06-26 ENCOUNTER — Ambulatory Visit: Payer: Medicare Other | Admitting: Family

## 2022-07-04 ENCOUNTER — Ambulatory Visit: Payer: Medicare Other | Admitting: Infectious Disease

## 2022-07-07 ENCOUNTER — Telehealth (HOSPITAL_COMMUNITY): Payer: Self-pay

## 2022-07-07 NOTE — Telephone Encounter (Signed)
Called to confirm/remind patient of their appointment at [facility] on 07/10/22.   Patient reminded to bring all medications and/or complete list.  Confirmed patient has transportation. Gave directions, instructed to utilize valet parking.  Confirmed appointment prior to ending call.   

## 2022-07-10 ENCOUNTER — Encounter (HOSPITAL_COMMUNITY): Payer: Medicare Other

## 2022-07-14 ENCOUNTER — Telehealth (HOSPITAL_COMMUNITY): Payer: Self-pay

## 2022-07-14 NOTE — Telephone Encounter (Signed)
Called to confirm/remind patient of their appointment at the [facility] on 07/17/22.   Patient reminded to bring all medications and/or complete list.  Confirmed patient has transportation. Gave directions, instructed to utilize valet parking.  Confirmed appointment prior to ending call.

## 2022-07-17 ENCOUNTER — Encounter (HOSPITAL_COMMUNITY): Payer: Medicare Other

## 2022-07-17 ENCOUNTER — Encounter: Payer: Medicare Other | Admitting: Cardiology

## 2022-07-17 NOTE — Progress Notes (Deleted)
Electrophysiology Office Note   Date:  07/17/2022   ID:  Ryan Long, DOB February 22, 1961, MRN [id number]  PCP:  Elsie Stain, MD  Cardiologist:  [place] Primary Electrophysiologist:  Roselee Tayloe Meredith Leeds, MD    No chief complaint on file.    History of Present Illness: Ryan Long is a 61 y.o. male who is being seen today for the evaluation of CHF at the request of Ryan Long. Presenting today for electrophysiology evaluation.  He has a history seen for tobacco abuse, alcohol abuse, bipolar 1, nonischemic cardiomyopathy, COPD, HIV, hyperlipidemia, hypertension.  He is now status post [place] ICD implanted for his heart failure.***  Today, denies symptoms of palpitations, chest pain, shortness of breath, orthopnea, PND, lower extremity edema, claudication, dizziness, presyncope, syncope, bleeding, or neurologic sequela. The patient is tolerating medications without difficulties. ***    Past Medical History:  Diagnosis Date   Active smoker    AICD (automatic cardioverter/defibrillator) present    Alcohol abuse    Anxiety    AR (allergic rhinitis)    Bipolar 1 disorder (HCC)    Cervical lymphadenitis 04/20/2021   CHF (congestive heart failure) (HCC)    Chronic bronchitis (HCC)    Chronic systolic heart failure (HCC)    Controlled substance agreement signed 10/22/2018   COPD (chronic obstructive pulmonary disease) ([place])    Cough 12/31/2018   Crack cocaine use    Depression    Genital herpes    HIV (human immunodeficiency virus infection) ([place]) dx'd 08/1993   HLD (hyperlipidemia)    Hypertension    NICM (nonischemic cardiomyopathy) ([place])    Echocardiogram 06/28/11: EF 30-35%, mild MR, mild LAE;  No CAD by coronary CT angiogram 3/12 at [facility]   NSVT (nonsustained ventricular tachycardia)    PTSD (post-traumatic stress disorder)    Past Surgical History:  Procedure Laterality Date   CARDIAC DEFIBRILLATOR PLACEMENT  01/08/2018    ICD IMPLANT N/A 01/08/2018   Procedure: ICD IMPLANT;  Surgeon: Constance Haw, MD;  Location: [place] [facility];  Service: Cardiovascular;  Laterality: N/A;     Current Outpatient Medications  Medication Sig Dispense Refill   aspirin EC 81 MG tablet Take 81 mg by mouth daily. Swallow whole.     bictegravir-emtricitabine-tenofovir AF (BIKTARVY) 50-200-25 MG TABS tablet Take 1 tablet by mouth daily. 30 tablet 5   buPROPion (WELLBUTRIN) 100 MG tablet Take by mouth.     eplerenone (INSPRA) 25 MG tablet Take 0.5 tablets (12.5 mg total) by mouth daily. 15 tablet 11   fluticasone furoate-vilanterol (BREO ELLIPTA) 100-25 MCG/INH AEPB Inhale 1 puff into the lungs daily. 60 each 4   furosemide (LASIX) 20 MG tablet Take 1 tablet by mouth daily.     hydrOXYzine (ATARAX/VISTARIL) 25 MG tablet TAKE 1-2 TABLETS BY MOUTH 2 TIMES DAILY AS NEEDED FOR ANXIETY. (Patient taking differently: Take 25-50 mg by mouth 2 (two) times daily as needed for anxiety.) 60 tablet 3   Melatonin 10 MG CAPS Take 10 mg by mouth at bedtime as needed (sleep).     metoprolol succinate (TOPROL-XL) 50 MG 24 hr tablet Take 1 tablet (50 mg total) by mouth daily. For high blood pressure 90 tablet 3   Multiple Vitamins-Minerals (MENS MULTIPLUS PO) Take 1 tablet by mouth daily.     nicotine (NICODERM CQ - DOSED IN MG/24 HOURS) 21 mg/24hr patch Place 21 mg onto the skin daily.     potassium chloride SA (KLOR-CON M) 20 MEQ  tablet TAKE 1 TABLET BY MOUTH EVERY DAY 90 tablet 3   rosuvastatin (CRESTOR) 5 MG tablet Take 1 tablet by mouth daily.     sacubitril-valsartan (ENTRESTO) 24-26 MG Take 1 tablet by mouth 2 (two) times daily. 60 tablet 1   sildenafil (VIAGRA) 50 MG tablet Take 50 mg by mouth as needed for erectile dysfunction.     tamsulosin (FLOMAX) 0.4 MG CAPS capsule TAKE 2 CAPSULES(0.8MG  TOTAL) BY MOUTH EVERY DAY AFTER SUPPER FOR PROSTATE HEALTH (Patient taking differently: Take 0.8 mg by mouth daily after supper.) 60 capsule 2    traZODone (DESYREL) 100 MG tablet TAKE 1 TABLET BY MOUTH AT BEDTIME AS NEEDED FOR SLEEP 30 tablet 3   valACYclovir (VALTREX) 500 MG tablet TAKE 1 TABLET BY MOUTH EVERY DAY FOR HERPES (Patient taking differently: Take 500 mg by mouth daily.) 90 tablet 4   No current facility-administered medications for this visit.    Allergies:   Patient has no known allergies.   Social History:  The patient  reports that he has been smoking cigarettes. He has a 28.50 pack-year smoking history. He has never used smokeless tobacco. He reports that he does not currently use alcohol after a past usage of about 6.0 standard drinks of alcohol per week. He reports that he does not currently use drugs after having used the following drugs: Cocaine.   Family History:  The patient's family history includes Alcohol abuse in his brother and father; CAD in his father; Drug abuse in his brother, brother, and father; Early death in his father; Glaucoma in his mother; Heart attack (age of onset: 60) in his father; Hypertension in his father; Mental illness in his father and mother; Vision loss in his mother.   ROS:  Please see the history of present illness.   Otherwise, review of systems is positive for none.   All other systems are reviewed and negative.   PHYSICAL EXAM: VS:  There were no vitals taken for this visit. , BMI There is no height or weight on file to calculate BMI. GEN: Well nourished, well developed, in no acute distress  HEENT: normal  Neck: no JVD, carotid bruits, or masses Cardiac: ***RRR; no murmurs, rubs, or gallops,no edema  Respiratory:  clear to auscultation bilaterally, normal work of breathing GI: soft, nontender, nondistended, + BS MS: no deformity or atrophy  Skin: warm and dry, device site well healed Neuro:  Strength and sensation are intact Psych: euthymic mood, full affect  EKG:  EKG {ACTION; IS/IS [id number]} ordered today. Personal review of the ekg ordered *** shows ***  Personal  review of the device interrogation today. Results in Paceart   Recent Labs: 09/06/2021: NT-Pro BNP 755; TSH 0.217 09/26/2021: B Natriuretic Peptide 668.2; Hemoglobin 17.9; Magnesium 2.2; Platelets 173 06/06/2022: ALT 23; BUN 18; Creat 1.59; Potassium 4.2; Sodium 142    Lipid Panel     Component Value Date/Time   CHOL 162 03/10/2021 0942   TRIG 76 03/10/2021 0942   HDL 49 03/10/2021 0942   CHOLHDL 3.3 03/10/2021 0942   VLDL 31 06/25/2020 1755   LDLCALC 96 03/10/2021 0942     Wt Readings from Last 3 Encounters:  09/26/21 204 lb (92.5 kg)  09/12/21 210 lb (95.3 kg)  09/06/21 211 lb 9.6 oz (96 kg)      Other studies Reviewed: Additional studies/ records that were reviewed today include: TTE 10/10/17  Review of the above records today demonstrates:  - Left ventricle: The cavity size was normal.  Wall thickness was   normal. Systolic function was severely reduced. The estimated   ejection fraction was in the range of 25% to 30%. Diffuse   hypokinesis. Doppler parameters are consistent with abnormal left   ventricular relaxation (grade 1 diastolic dysfunction).   ASSESSMENT AND PLAN:  1.  Chronic systolic heart failure due to nonischemic cardiomyopathy: Ejection fraction remains low.  Currently on optimal medical therapy per primary cardiology.  Status post [place] ICD implanted 01/08/2018.  Device functioning appropriately.  No changes.  2.  HIV: Follows with Dr. Daiva Eves.  No changes.  3.  Tobacco abuse: Complete cessation encouraged  4.  Cocaine abuse: Continued abstinence encouraged  Current medicines are reviewed at length with the patient today.   The patient does not have concerns regarding his medicines.  The following changes were made today:  ***  Labs/ tests ordered today include:  No orders of the defined types were placed in this encounter.    Disposition:   FU with Miray Mancino *** months  Signed, Percilla Tweten [name], MD  07/17/2022 1:57 PM     [facility]  [facility] [address] [place] [address] [phone] (office) [phone] (fax)

## 2022-07-18 ENCOUNTER — Ambulatory Visit: Payer: Medicare Other | Admitting: Family

## 2022-07-21 ENCOUNTER — Ambulatory Visit: Payer: Medicare Other | Admitting: Family

## 2022-07-26 NOTE — Progress Notes (Deleted)
Electrophysiology Office Note Date: 07/26/2022  ID:  Ryan Long, DOB 05/22/1961, MRN [id number]  PCP: Ryan Frisk, MD Primary Cardiologist: None Electrophysiologist: Ryan [name], MD   CC: Routine ICD follow-up  Ryan Long is a 61 y.o. male seen today for Ryan [name], MD for routine electrophysiology followup.  Since last being seen in our clinic the patient reports doing ***.  he denies chest pain, palpitations, dyspnea, PND, orthopnea, nausea, vomiting, dizziness, syncope, edema, weight gain, or early satiety. {He/she (caps):30048} has not had ICD shocks.   Device History: St. Jude Single Chamber ICD implanted 2019 for CHF  Past Medical History:  Diagnosis Date   Active smoker    AICD (automatic cardioverter/defibrillator) present    Alcohol abuse    Anxiety    AR (allergic rhinitis)    Bipolar 1 disorder (HCC)    Cervical lymphadenitis 04/20/2021   CHF (congestive heart failure) (HCC)    Chronic bronchitis (HCC)    Chronic systolic heart failure (HCC)    Controlled substance agreement signed 10/22/2018   COPD (chronic obstructive pulmonary disease) (HCC)    Cough 12/31/2018   Crack cocaine use    Depression    Genital herpes    HIV (human immunodeficiency virus infection) (HCC) dx'd 08/1993   HLD (hyperlipidemia)    Hypertension    NICM (nonischemic cardiomyopathy) (HCC)    Echocardiogram 06/28/11: EF 30-35%, mild MR, mild LAE;  No CAD by coronary CT angiogram 3/12 at [facility]   NSVT (nonsustained ventricular tachycardia)    PTSD (post-traumatic stress disorder)    Past Surgical History:  Procedure Laterality Date   CARDIAC DEFIBRILLATOR PLACEMENT  01/08/2018   ICD IMPLANT N/A 01/08/2018   Procedure: ICD IMPLANT;  Surgeon: Regan Lemming, MD;  Location: [facility];  Service: Cardiovascular;  Laterality: N/A;    Current Outpatient Medications  Medication Sig Dispense Refill   aspirin EC 81 MG  tablet Take 81 mg by mouth daily. Swallow whole.     bictegravir-emtricitabine-tenofovir AF (BIKTARVY) 50-200-25 MG TABS tablet Take 1 tablet by mouth daily. 30 tablet 5   buPROPion (WELLBUTRIN) 100 MG tablet Take by mouth.     eplerenone (INSPRA) 25 MG tablet Take 0.5 tablets (12.5 mg total) by mouth daily. 15 tablet 11   fluticasone furoate-vilanterol (BREO ELLIPTA) 100-25 MCG/INH AEPB Inhale 1 puff into the lungs daily. 60 each 4   furosemide (LASIX) 20 MG tablet Take 1 tablet by mouth daily.     hydrOXYzine (ATARAX/VISTARIL) 25 MG tablet TAKE 1-2 TABLETS BY MOUTH 2 TIMES DAILY AS NEEDED FOR ANXIETY. (Patient taking differently: Take 25-50 mg by mouth 2 (two) times daily as needed for anxiety.) 60 tablet 3   Melatonin 10 MG CAPS Take 10 mg by mouth at bedtime as needed (sleep).     metoprolol succinate (TOPROL-XL) 50 MG 24 hr tablet Take 1 tablet (50 mg total) by mouth daily. For high blood pressure 90 tablet 3   Multiple Vitamins-Minerals (MENS MULTIPLUS PO) Take 1 tablet by mouth daily.     nicotine (NICODERM CQ - DOSED IN MG/24 HOURS) 21 mg/24hr patch Place 21 mg onto the skin daily.     potassium chloride SA (KLOR-CON M) 20 MEQ tablet TAKE 1 TABLET BY MOUTH EVERY DAY 90 tablet 3   rosuvastatin (CRESTOR) 5 MG tablet Take 1 tablet by mouth daily.     sacubitril-valsartan (ENTRESTO) 24-26 MG Take 1 tablet by mouth 2 (two) times daily.  60 tablet 1   sildenafil (VIAGRA) 50 MG tablet Take 50 mg by mouth as needed for erectile dysfunction.     tamsulosin (FLOMAX) 0.4 MG CAPS capsule TAKE 2 CAPSULES(0.8MG  TOTAL) BY MOUTH EVERY DAY AFTER SUPPER FOR PROSTATE HEALTH (Patient taking differently: Take 0.8 mg by mouth daily after supper.) 60 capsule 2   traZODone (DESYREL) 100 MG tablet TAKE 1 TABLET BY MOUTH AT BEDTIME AS NEEDED FOR SLEEP 30 tablet 3   valACYclovir (VALTREX) 500 MG tablet TAKE 1 TABLET BY MOUTH EVERY DAY FOR HERPES (Patient taking differently: Take 500 mg by mouth daily.) 90 tablet 4    No current facility-administered medications for this visit.    Allergies:   Patient has no known allergies.   Social History: Social History   Socioeconomic History   Marital status: Single    Spouse name: Not on file   Number of children: Not on file   Years of education: Not on file   Highest education level: Not on file  Occupational History   Not on file  Tobacco Use   Smoking status: Every Day    Packs/day: 0.75    Years: 38.00    Total pack years: 28.50    Types: Cigarettes   Smokeless tobacco: Never   Tobacco comments:    hoping welbutrin Ryan help  Vaping Use   Vaping Use: Never used  Substance and Sexual Activity   Alcohol use: Not Currently    Alcohol/week: 6.0 standard drinks of alcohol    Types: 6 Cans of beer per week    Comment: 01/08/2018 "stopped 06/2017"   Drug use: Not Currently    Types: Cocaine    Comment: 01/08/2018 "stopped 06/2017"   Sexual activity: Yes    Birth control/protection: Condom    Comment: pt. declined condoms  Other Topics Concern   Not on file  Social History Narrative   Not on file   Social Determinants of Health   Financial Resource Strain: Not on file  Food Insecurity: Not on file  Transportation Needs: Not on file  Physical Activity: Not on file  Stress: Not on file  Social Connections: Not on file  Intimate Partner Violence: Not on file    Family History: Family History  Problem Relation Age of Onset   Glaucoma Mother    Mental illness Mother    Vision loss Mother    Hypertension Father    CAD Father    Mental illness Father    Alcohol abuse Father    Drug abuse Father    Heart attack Father 60   Early death Father    Alcohol abuse Brother    Drug abuse Brother    Drug abuse Brother     Review of Systems: All other systems reviewed and are otherwise negative except as noted above.   Physical Exam: There were no vitals filed for this visit.   GEN- The patient is well appearing, alert and oriented  x 3 today.   HEENT: normocephalic, atraumatic; sclera clear, conjunctiva pink; hearing intact; oropharynx clear; neck supple, no JVP Lymph- no cervical lymphadenopathy Lungs- Clear to ausculation bilaterally, normal work of breathing.  No wheezes, rales, rhonchi Heart- Regular rate and rhythm, no murmurs, rubs or gallops, PMI not laterally displaced GI- soft, non-tender, non-distended, bowel sounds present, no hepatosplenomegaly Extremities- no clubbing or cyanosis. No edema; DP/PT/radial pulses 2+ bilaterally MS- no significant deformity or atrophy Skin- warm and dry, no rash or lesion; ICD pocket well healed Psych-  euthymic mood, full affect Neuro- strength and sensation are intact  ICD interrogation- reviewed in detail today,  See PACEART report  EKG:  EKG is ordered today. Personal review of EKG ordered today shows ***  Recent Labs: 09/06/2021: NT-Pro BNP 755; TSH 0.217 09/26/2021: B Natriuretic Peptide 668.2; Hemoglobin 17.9; Magnesium 2.2; Platelets 173 06/06/2022: ALT 23; BUN 18; Creat 1.59; Potassium 4.2; Sodium 142   Wt Readings from Last 3 Encounters:  09/26/21 204 lb (92.5 kg)  09/12/21 210 lb (95.3 kg)  09/06/21 211 lb 9.6 oz (96 kg)     Other studies Reviewed: Additional studies/ records that were reviewed today include: Previous EP office notes.   Assessment and Plan:  1.  Chronic systolic dysfunction s/p [employer]. Jude single chamber ICD  euvolemic today Stable on an appropriate medical regimen Normal ICD function See Pace Art report No changes today  2. H/o VT Continue toprol 50 mg daily.  ***  3. Tobacco abuse Encouraged cessation  4. Substance abuse Maintains abstinence ***  5. HIV Follows with ID  Current medicines are reviewed at length with the patient today.   =  Labs/ tests ordered today include: *** No orders of the defined types were placed in this encounter.    Disposition:   Follow up with Dr. Elberta Fortis in 12 months    Signed, Graciella Freer, PA-C  07/26/2022 8:48 AM  [facility] [address] [place] [address] [phone] (office) [phone] (fax)

## 2022-07-27 ENCOUNTER — Ambulatory Visit: Payer: Medicare Other | Admitting: Family

## 2022-07-30 NOTE — Progress Notes (Deleted)
Established Patient Office Visit  Subjective   Patient ID: Ryan Long, male    DOB: August 20, 1961  Age: 61 y.o. MRN: [id number]  No chief complaint on file.   Seen 11/2021 at [place] PCP in [facility]: Chief Complaint  Patient presents with   Establish Care   HPI  Ryan Long is a 61 y.o male with pmhx significant for the following: 1.HIV - (follows with ID). On Biktarvy. No missed doses.  2.Non-Ischemic Cardiomyopathy - ICD in place. NYHA class II. EF 30-35% on TTE in 2016. Repeat TTE, stress test scheduled. Maintained on Entresto, ASA 81 Mg daily. Referral unknown 2.5 Mg every day, Toprol-XL 50 Mg daily 3. Mild aortic stenosis, Moderate MR - Follows with Cardiology 4. A-Fib - Follows with Cardiology. No prior documentation of a-fib. Not currently anticoagulated. Work-up underway.  5. BPH - On Flomax. Nocturia 3 times nightly.  6. HSV-2 - Follows with ID 7. COPD - Maintained on Breo-ellipta. Rescue inhaler use <3 times weeky.  8. ED - Stable on Viagra as needed.  9. Insomnia - Well controlled on Melatonin  10. Anxiety - Stable on vistaril PRN.  11. Lung Cancer screening - Next due 08/2022. recent CT with no pulmonary nodules. Mild bronchial wall thickening. Suggestive of chronic bronchitis with known history of COPD 12. History of polysubstance abuse (cocaine, alcohol) - Clean since 07/2020.   Interval History:  He presents today for establishment of care. He is doing well today without any acute concerns. Blood pressure is under excellent control. Denies any chest pain, palpitations, orthopnea/PND, shortness of breath. Does endorse mild worsening lower extremity edema, reports previously was on Lasix 40 Mg daily however this was recently discontinued. COPD symptoms are well controlled, endorses Breo Ellipta use daily. Risk inhaler use approximately 2 times weekly. Does endorse some nocturia, denies urinary straining, dribbling. Denies hematuria. Chronically maintained on  Flomax. Follows with ID, on preventative Valtrex for HSV. Regarding HIV, currently maintained on Biktarvy without missed doses. Currently asymptomatic. Currently smoking 0.6 PPD. Lung cancer screening up-to-date. He is working to cut back, request refills on nicotine patches. He is otherwise well. Denies symptoms of depression/anxiety. Reports Cologuard over the past 3 years, will look into this. Other health maintenance is up-to-date. Declines flu vaccination  Allergies No Known Allergies  Medications Current Outpatient Medications  Medication Sig Dispense Refill   albuterol 90 mcg/actuation inhaler Inhale 1 puff into the lungs every 6 (six) hours as needed for Wheezing. 1 each 3   aspirin 81 MG EC tablet *ANTIPLATELET* Take 1 tablet (81 mg total) by mouth daily. 90 tablet 3   bictegravir-emtricitabine-tenofovir alafenamide (BIKTARVY) 50-200-25 mg per tablet Take 1 tablet by mouth daily. 90 tablet 3   eplerenone (INSPRA) 25 MG tablet TAKE 1/2 TABLET BY MOUTH EVERY DAY 45 tablet 3   hydrOXYzine pamoate (VISTARIL) 25 mg capsule Take 1 capsule (25 mg total) by mouth 2 (two) times daily as needed for Anxiety. 30 capsule 3   melatonin 10 mg capsule Take 1 capsule (10 mg total) by mouth at bedtime. 60 capsule 2   metoPROLOL succinate (TOPROL-XL) 50 MG 24 hr tablet Take 50 mg by mouth daily.   multivit-min-FA-lycopen-lutein (MEN 50 PLUS MULTIVITAMIN) 300-600-300 mcg Tab Take by mouth.   potassium chloride ER (KLOR-CON-M, K-DUR) 20 MEQ extended release tablet Take by mouth.   sacubitriL-valsartan (ENTRESTO) 24-26 mg per tablet Take 1 tablet by mouth 2 times daily. 120 tablet 3   sildenafiL (VIAGRA) 50 MG tablet TK 1  T PO QD 1 HOUR B SEXUAL ACTIVITY PRN 10 tablet 2   tamsulosin (FLOMAX) 0.4 mg Cap capsule TAKE 2 CAPSULES BY MOUTH EVERY DAY FOR BPH 180 capsule 3   traZODone (DESYREL) 100 MG tablet Take 1 tablet (100 mg total) by mouth nightly. 30 tablet 3   valACYclovir (VALTREX) 500 MG  tablet Take 1 tablet (500 mg total) by mouth daily. 90 tablet 3   fluticasone furoate-vilanteroL (BREO ELLIPTA) 100-25 mcg/dose inhaler Inhale 1 puff into the lungs daily. 60 each 2   furosemide (LASIX) 20 MG tablet Take 1 tablet (20 mg total) by mouth daily. 90 tablet 2   neomycin-polymyxin-hydrocortisone (CORTISPORIN) 3.5-10,000-1 mg/mL-unit/mL-% otic suspension Place 3 drops into both ears 4 times daily. 10 mL 0   nicotine (NICODERM CQ) 21 mg/24 hr Place 1 patch onto the skin daily for 30 days. 30 patch 2   nystatin (MYCOSTATIN) 100,000 unit/mL suspension Take 5 mLs (500,000 Units total) by mouth 4 times daily for 10 days. 200 mL 0   No current facility-administered medications for this visit.   Medical history Past Medical History:  Diagnosis Date   Anxiety   CHF (congestive heart failure) (HCC)   Depression   Human immunodeficiency virus (HIV) disease (HCC)   Hypertension   ICD (implantable cardioverter-defibrillator) in place   Mood disorder (HCC)   Polysubstance dependence (HCC)   Surgical History History reviewed. No pertinent surgical history.  Social History Social History   Tobacco Use  Smoking Status Current Every Day Smoker   Packs/day: 0.85   Years: 39.00   Pack years: 33.15   Types: Cigarettes   Start date: 12/18/1981  Smokeless Tobacco Never Used  Tobacco Comment  2022-per 2021 LS form/chart(0.85); 1ppd-0.75ppd    Family History Family History  Problem Relation Age of Onset   Heart disease Father 66  suffered MI at 7   Hypertension Father   Cancer Neg Hx   Diabetes Neg Hx   Stroke Neg Hx   I have reviewed and (if needed) updated the patient's problem list, medications, allergies, past medical and surgical history, social and family history.  Health Maintenance Status  Date Due Completion Dates  COLONOSCOPY Never done ---  COVID-19 Vaccine (5 - Booster for Janssen series) 10/30/2021 09/04/2021, 02/02/2021  Lung Cancer Screening  08/26/2022 08/26/2021  CMP or BMP 10/21/2022 10/21/2021, 02/24/2020  COLON CANCER SCREENING 3 YEAR COLOGUARD 04/26/2024 04/26/2021    Review of Systems  Constitutional: Negative for fever, malaise/fatigue and weight loss.  HENT: Negative for congestion and hearing loss.  Eyes: Negative for blurred vision and pain.  Respiratory: Negative for cough, shortness of breath and wheezing.  Cardiovascular: Positive for leg swelling. Negative for chest pain, palpitations, orthopnea and claudication.  Gastrointestinal: Negative for constipation, diarrhea, nausea and vomiting.  Genitourinary: Negative for dysuria and urgency.  Musculoskeletal: Negative for myalgias.  Skin: Negative for itching and rash.  Neurological: Negative for dizziness, weakness and headaches.  Endo/Heme/Allergies: Negative for polydipsia. Does not bruise/bleed easily.  Psychiatric/Behavioral: Negative for depression and substance abuse.   Objective:   Vital Signs Vitals:  11/23/21 0832  BP: 101/71  Pulse: 94  Temp: 98 F (36.7 C)  Resp: 16  SpO2: 96%  PainSc: 0-Zero  Height: 1.753 m (5\' 9" )  Weight: 93.8 kg (206 lb 12.8 oz)  BMI (Calculated): 30.6   Physical Exam Vitals and nursing note reviewed.  Constitutional:  Appearance: Normal appearance. He is normal weight.  HENT:  Head: Normocephalic and atraumatic.  Right Ear: Tympanic  membrane and external ear normal.  Left Ear: Tympanic membrane and external ear normal.  Ears:  Comments: Erythema, irritation notable to external ear canals bilaterally. There is no injection, bulging, purulence seen in middle ear. No evidence of AOM. Suspect mild otitis externa versus potential eczema Nose: Nose normal. No congestion or rhinorrhea.  Mouth/Throat:  Mouth: Mucous membranes are moist.  Pharynx: Oropharynx is clear. No oropharyngeal exudate or posterior oropharyngeal erythema.  Comments: thrush notable to surface of tongue, without evidence of extension into  esophagus Eyes:  Extraocular Movements: Extraocular movements intact.  Conjunctiva/sclera: Conjunctivae normal.  Pupils: Pupils are equal, round, and reactive to light.  Cardiovascular:  Rate and Rhythm: Normal rate and regular rhythm.  Pulses: Normal pulses.  Posterior tibial pulses are 2+ on the right side and 2+ on the left side.  Heart sounds: S1 normal and S2 normal. No murmur heard. No friction rub. No gallop. No S3 or S4 sounds.  Comments: Bilateral 1+ pitting edema to the level of the midshin Pulmonary:  Effort: Pulmonary effort is normal. No respiratory distress.  Breath sounds: Normal breath sounds. No wheezing or rales.  Abdominal:  General: Bowel sounds are normal.  Palpations: Abdomen is soft. There is no mass.  Tenderness: There is no abdominal tenderness.  Musculoskeletal:  General: No swelling or deformity. Normal range of motion.  Cervical back: Normal range of motion.  Right lower leg: 1+ Pitting Edema present.  Left lower leg: 1+ Pitting Edema present.  Skin: General: Skin is warm and dry.  Capillary Refill: Capillary refill takes less than 2 seconds.  Findings: No erythema or rash.  Neurological:  General: No focal deficit present.  Mental Status: He is alert and oriented to person, place, and time. Mental status is at baseline.  Psychiatric:  Mood and Affect: Mood normal.  Behavior: Behavior normal.  Thought Content: Thought content normal.  Judgment: Judgment normal.   Assessment/Plan:   Joskar was seen today for establish care.  Diagnoses and all orders for this visit:  Establishing care with new doctor, encounter for  Non-ischemic cardiomyopathy ([place]), HFrEF (heart failure with reduced ejection fraction) ([place]) - following with cardiology. On Entresto, Toprol-XL 50 Mg daily. - Mildly volume overloaded in clinic today otherwise well perfused and well compensated. - We will add on Lasix 20 Mg daily, return in 2 weeks for reassessment and assessment  of renal function/electrolyte status. - Continue Inspra 25 Mg daily. - ICD in place. No shocks since October. - Perfusion stress test, TTE is scheduled. - Continue to follow with cardiology - furosemide (LASIX) 20 MG tablet; Take 1 tablet (20 mg total) by mouth daily. - CBC and Differential; Future - Comprehensive Metabolic Panel; Future - Magnesium; Future  Human immunodeficiency virus (HIV) disease ([place]), HSV-2 infection -Following with infectious diseases. Last CD4 540. -Compliant with Biktarvy. On chronic Valtrex for HSV-2 secondary prophylaxis. -Stable at this time  Simple chronic bronchitis ([place]), Encounter for screening for lung cancer, Cigarette nicotine dependence without complication -Bronchitis is well controlled. ICS/LABA therapy. Rescue inhaler use less than 3 times weekly. -Is working towards smoking cessation, refill nicotine patches today. -Lung cancer screening up-to-date. -Reassess at follow-up - fluticasone furoate-vilanteroL (BREO ELLIPTA) 100-25 mcg/dose inhaler; Inhale 1 puff into the lungs daily - nicotine (NICODERM CQ) 21 mg/24 hr; Place 1 patch onto the skin daily for 30 days.  Oral thrush -Incidental thrush noted. Will treat with nystatin. -Patient educated about rinsing out mouth following control inhaler use. -Will reassess at 2 weeks for resolution -  nystatin (MYCOSTATIN) 100,000 unit/mL suspension; Take 5 mLs (500,000 Units total) by mouth 4 times daily for 10 days.  Acute diffuse otitis externa of both ears - incidental finding on physical examination. Overall asymptomatic. - Suspect mild acute otitis externa versus potential eczema. - Will trial Corticosporin, reassess at 2 weeks - neomycin-polymyxin-hydrocortisone (CORTISPORIN) 3.5-10,000-1 mg/mL-unit/mL-% otic suspension; Place 3 drops into both ears 4 times daily.  Lipid screening - Lipid Profile; Future  Screening PSA (prostate specific antigen) -Will obtain today.   Screening for thyroid  disorder - TSH, 3rd Generation; Future  Screening for diabetes mellitus - Hemoglobin A1C; Future  Encounter for vitamin deficiency screening - 25(OH) Vitamin D Total; Future  Patient verbalizes understanding and in agreement with the above plan. All questions answered.   Medication side effects discussed with patient. Advised patient to call clinic or return for visit if these symptoms occur.  Goals of care discussed with patient including med compliance and adequate follow up.  Return in about 2 weeks (around 12/07/2021), or if symptoms worsen or fail to improve, for Follow-up in 2 weeks after lasix. .  Electronically signed by: Electronically signed by: Iver Nestle, PA-C 11/23/2021 9:31 AM 11/23/2021 9:31 AM  Electronically signed by: Iver Nestle, PA-C 11/23/21 [phone]  Electronically signed by Iver Nestle, PA-C at 11/23/2021 9:36 AM EST     {History (Optional):23778}  ROS    Objective:     There were no vitals taken for this visit. {Vitals History (Optional):23777}  Physical Exam   No results found for any visits on 07/31/22.  {Labs (Optional):23779}  The 10-year ASCVD risk score (Arnett DK, et al., 2019) is: 19.1%    Assessment & Plan:   Problem List Items Addressed This Visit   None   No follow-ups on file.    Shan Levans, MD

## 2022-07-31 ENCOUNTER — Ambulatory Visit: Payer: Medicare Other | Admitting: Critical Care Medicine

## 2022-07-31 ENCOUNTER — Encounter: Payer: Self-pay | Admitting: Critical Care Medicine

## 2022-08-01 ENCOUNTER — Ambulatory Visit: Payer: Medicare Other | Admitting: Family

## 2022-08-01 ENCOUNTER — Encounter: Payer: Self-pay | Admitting: Critical Care Medicine

## 2022-08-01 MED ORDER — TAMSULOSIN HCL 0.4 MG PO CAPS: 0.8000 mg | ORAL_CAPSULE | Freq: Every day | ORAL | 1 refills | Status: DC

## 2022-08-01 MED ORDER — HYDROXYZINE HCL 25 MG PO TABS
ORAL_TABLET | ORAL | 3 refills | Status: DC
Start: 2022-08-01 — End: 2022-08-13

## 2022-08-01 NOTE — Telephone Encounter (Signed)
I tried to call this patient several times today he did not answer he keeps messaging me in my chart see the MyChart messages in the chart  He really needs to be seen by a provider if he is having still severe diarrhea and gastrointestinal symptoms with his history and medical conditions either needs to go to mobile medicine tomorrow urgent care or the emergency room

## 2022-08-02 ENCOUNTER — Encounter: Payer: Medicare Other | Admitting: Student

## 2022-08-02 ENCOUNTER — Telehealth: Payer: Self-pay

## 2022-08-02 DIAGNOSIS — I428 Other cardiomyopathies: Secondary | ICD-10-CM

## 2022-08-02 DIAGNOSIS — I5022 Chronic systolic (congestive) heart failure: Secondary | ICD-10-CM

## 2022-08-02 DIAGNOSIS — I472 Ventricular tachycardia, unspecified: Secondary | ICD-10-CM

## 2022-08-02 DIAGNOSIS — F1490 Cocaine use, unspecified, uncomplicated: Secondary | ICD-10-CM

## 2022-08-02 NOTE — Telephone Encounter (Signed)
Called patient and left voicemail about recent mychart message.   Per Dr.Wright If patient is still having symptoms as yesterday to be seen at ED or UC or the mobile bus.

## 2022-08-03 ENCOUNTER — Encounter: Payer: Self-pay | Admitting: Critical Care Medicine

## 2022-08-07 ENCOUNTER — Ambulatory Visit: Payer: Medicare Other | Admitting: Family

## 2022-08-07 NOTE — Progress Notes (Deleted)
Electrophysiology Office Note Date: 08/07/2022  ID:  Ryan Long, DOB [birth year], MRN [id number]  PCP: Storm Frisk, MD Primary Cardiologist: None Electrophysiologist: Will [name], MD   CC: Routine ICD follow-up  Ryan Long is a 61 y.o. male seen today for Will [name], MD for routine electrophysiology followup.  Since last being seen in our clinic the patient reports doing ***.  he denies chest pain, palpitations, dyspnea, PND, orthopnea, nausea, vomiting, dizziness, syncope, edema, weight gain, or early satiety. {He/she (caps):30048} has not had ICD shocks.   Device History: St. Jude Single Chamber ICD implanted 2019 for CHF  Past Medical History:  Diagnosis Date   Active smoker    AICD (automatic cardioverter/defibrillator) present    Alcohol abuse    Anxiety    AR (allergic rhinitis)    Bipolar 1 disorder (HCC)    Cervical lymphadenitis 04/20/2021   CHF (congestive heart failure) (HCC)    Chronic bronchitis (HCC)    Chronic systolic heart failure (HCC)    Controlled substance agreement signed 10/22/2018   COPD (chronic obstructive pulmonary disease) (HCC)    Cough 12/31/2018   Crack cocaine use    Depression    Genital herpes    HIV (human immunodeficiency virus infection) (HCC) dx'd 08/1993   HLD (hyperlipidemia)    Hypertension    NICM (nonischemic cardiomyopathy) (HCC)    Echocardiogram 06/28/11: EF 30-35%, mild MR, mild LAE;  No CAD by coronary CT angiogram 3/12 at [facility]   NSVT (nonsustained ventricular tachycardia) (HCC)    PTSD (post-traumatic stress disorder)    Past Surgical History:  Procedure Laterality Date   CARDIAC DEFIBRILLATOR PLACEMENT  01/08/2018   ICD IMPLANT N/A 01/08/2018   Procedure: ICD IMPLANT;  Surgeon: Regan Lemming, MD;  Location: [facility] INVASIVE CV LAB;  Service: Cardiovascular;  Laterality: N/A;    Current Outpatient Medications  Medication Sig Dispense Refill   aspirin EC  81 MG tablet Take 81 mg by mouth daily. Swallow whole.     bictegravir-emtricitabine-tenofovir AF (BIKTARVY) 50-200-25 MG TABS tablet Take 1 tablet by mouth daily. 30 tablet 5   buPROPion (WELLBUTRIN) 100 MG tablet Take by mouth.     eplerenone (INSPRA) 25 MG tablet Take 0.5 tablets (12.5 mg total) by mouth daily. 15 tablet 11   fluticasone furoate-vilanterol (BREO ELLIPTA) 100-25 MCG/INH AEPB Inhale 1 puff into the lungs daily. 60 each 4   furosemide (LASIX) 20 MG tablet Take 1 tablet by mouth daily.     hydrOXYzine (ATARAX) 25 MG tablet TAKE 1-2 TABLETS BY MOUTH 2 TIMES DAILY AS NEEDED FOR ANXIETY. 60 tablet 3   Melatonin 10 MG CAPS Take 10 mg by mouth at bedtime as needed (sleep).     metoprolol succinate (TOPROL-XL) 50 MG 24 hr tablet Take 1 tablet (50 mg total) by mouth daily. For high blood pressure 90 tablet 3   Multiple Vitamins-Minerals (MENS MULTIPLUS PO) Take 1 tablet by mouth daily.     nicotine (NICODERM CQ - DOSED IN MG/24 HOURS) 21 mg/24hr patch Place 21 mg onto the skin daily.     potassium chloride SA (KLOR-CON M) 20 MEQ tablet TAKE 1 TABLET BY MOUTH EVERY DAY 90 tablet 3   rosuvastatin (CRESTOR) 5 MG tablet Take 1 tablet by mouth daily.     sacubitril-valsartan (ENTRESTO) 24-26 MG Take 1 tablet by mouth 2 (two) times daily. 60 tablet 1   sildenafil (VIAGRA) 50 MG tablet Take 50 mg by mouth  as needed for erectile dysfunction.     tamsulosin (FLOMAX) 0.4 MG CAPS capsule Take 2 capsules (0.8 mg total) by mouth daily after supper. 120 capsule 1   traZODone (DESYREL) 100 MG tablet TAKE 1 TABLET BY MOUTH AT BEDTIME AS NEEDED FOR SLEEP 30 tablet 3   valACYclovir (VALTREX) 500 MG tablet TAKE 1 TABLET BY MOUTH EVERY DAY FOR HERPES (Patient taking differently: Take 500 mg by mouth daily.) 90 tablet 4   No current facility-administered medications for this visit.    Allergies:   Patient has no known allergies.   Social History: Social History   Socioeconomic History   Marital  status: Single    Spouse name: Not on file   Number of children: Not on file   Years of education: Not on file   Highest education level: Not on file  Occupational History   Not on file  Tobacco Use   Smoking status: Every Day    Packs/day: 0.75    Years: 38.00    Total pack years: 28.50    Types: Cigarettes   Smokeless tobacco: Never   Tobacco comments:    hoping welbutrin will help  Vaping Use   Vaping Use: Never used  Substance and Sexual Activity   Alcohol use: Not Currently    Alcohol/week: 6.0 standard drinks of alcohol    Types: 6 Cans of beer per week    Comment: 01/08/2018 "stopped 06/2017"   Drug use: Not Currently    Types: Cocaine    Comment: 01/08/2018 "stopped 06/2017"   Sexual activity: Yes    Birth control/protection: Condom    Comment: pt. declined condoms  Other Topics Concern   Not on file  Social History Narrative   Not on file   Social Determinants of Health   Financial Resource Strain: Not on file  Food Insecurity: Not on file  Transportation Needs: Not on file  Physical Activity: Not on file  Stress: Not on file  Social Connections: Not on file  Intimate Partner Violence: Not on file    Family History: Family History  Problem Relation Age of Onset   Glaucoma Mother    Mental illness Mother    Vision loss Mother    Hypertension Father    CAD Father    Mental illness Father    Alcohol abuse Father    Drug abuse Father    Heart attack Father 45   Early death Father    Alcohol abuse Brother    Drug abuse Brother    Drug abuse Brother     Review of Systems: All other systems reviewed and are otherwise negative except as noted above.   Physical Exam: There were no vitals filed for this visit.   GEN- The patient is well appearing, alert and oriented x 3 today.   HEENT: normocephalic, atraumatic; sclera clear, conjunctiva pink; hearing intact; oropharynx clear; neck supple, no JVP Lymph- no cervical lymphadenopathy Lungs- Clear to  ausculation bilaterally, normal work of breathing.  No wheezes, rales, rhonchi Heart- Regular rate and rhythm, no murmurs, rubs or gallops, PMI not laterally displaced GI- soft, non-tender, non-distended, bowel sounds present, no hepatosplenomegaly Extremities- no clubbing or cyanosis. No edema; DP/PT/radial pulses 2+ bilaterally MS- no significant deformity or atrophy Skin- warm and dry, no rash or lesion; ICD pocket well healed Psych- euthymic mood, full affect Neuro- strength and sensation are intact  ICD interrogation- reviewed in detail today,  See PACEART report  EKG:  EKG is ordered today.  Personal review of EKG ordered today shows ***  Recent Labs: 09/06/2021: NT-Pro BNP 755; TSH 0.217 09/26/2021: B Natriuretic Peptide 668.2; Hemoglobin 17.9; Magnesium 2.2; Platelets 173 06/06/2022: ALT 23; BUN 18; Creat 1.59; Potassium 4.2; Sodium 142   Wt Readings from Last 3 Encounters:  09/26/21 204 lb (92.5 kg)  09/12/21 210 lb (95.3 kg)  09/06/21 211 lb 9.6 oz (96 kg)     Other studies Reviewed: Additional studies/ records that were reviewed today include: Previous EP office notes.   Assessment and Plan:  1.  Chronic systolic dysfunction s/p St. Jude single chamber ICD  euvolemic today Stable on an appropriate medical regimen Normal ICD function See Pace Art report No changes today  2. H/o VT Continue toprol 50 mg daily.  ***  3. Tobacco abuse Encouraged cessation  4. Substance abuse Maintains abstinence ***  5. HIV Follows with ID  Current medicines are reviewed at length with the patient today.   =  Labs/ tests ordered today include: *** No orders of the defined types were placed in this encounter.    Disposition:   Follow up with Dr. Curt Bears in 12 months    Signed, Shirley Friar, PA-C  08/07/2022 10:09 AM  [facility] [address] [place] [address] [phone] (office) [phone] (fax)

## 2022-08-08 ENCOUNTER — Ambulatory Visit (INDEPENDENT_AMBULATORY_CARE_PROVIDER_SITE_OTHER): Payer: Medicare Other | Admitting: Family

## 2022-08-08 ENCOUNTER — Encounter: Payer: Self-pay | Admitting: Family

## 2022-08-08 ENCOUNTER — Other Ambulatory Visit: Payer: Self-pay

## 2022-08-08 VITALS — BP 94/66 | HR 96 | Temp 97.7°F | Resp 16 | Ht 69.0 in | Wt 202.0 lb

## 2022-08-08 DIAGNOSIS — F5101 Primary insomnia: Secondary | ICD-10-CM | POA: Diagnosis not present

## 2022-08-08 DIAGNOSIS — N189 Chronic kidney disease, unspecified: Secondary | ICD-10-CM | POA: Insufficient documentation

## 2022-08-08 DIAGNOSIS — N1831 Chronic kidney disease, stage 3a: Secondary | ICD-10-CM

## 2022-08-08 DIAGNOSIS — B20 Human immunodeficiency virus [HIV] disease: Secondary | ICD-10-CM

## 2022-08-08 DIAGNOSIS — Z113 Encounter for screening for infections with a predominantly sexual mode of transmission: Secondary | ICD-10-CM

## 2022-08-08 DIAGNOSIS — Z0000 Encounter for general adult medical examination without abnormal findings: Secondary | ICD-10-CM | POA: Diagnosis not present

## 2022-08-08 DIAGNOSIS — Z79899 Other long term (current) drug therapy: Secondary | ICD-10-CM

## 2022-08-08 DIAGNOSIS — Z7189 Other specified counseling: Secondary | ICD-10-CM | POA: Insufficient documentation

## 2022-08-08 MED ORDER — TRAZODONE HCL 50 MG PO TABS: 50.0000 mg | ORAL_TABLET | Freq: Every evening | ORAL | 5 refills | Status: DC | PRN

## 2022-08-08 NOTE — Assessment & Plan Note (Signed)
Ryan Long has well controlled virus with good adherence and tolerance to Biktarvy. Reviewed previous lab work and discussed plan of care. Continue current dose of Biktarvy. Plan for follow up in 3 months or sooner if needed with lab work 1-2 weeks prior to appointment.

## 2022-08-08 NOTE — Assessment & Plan Note (Signed)
Mr. Wadding has chronic kidney disease Stage 3 with previous eGFR to 15 and creatinine 1.59. Creatinine clearance is 63. Encouraged hydration to limits allowed by his heart failure. Continue control of other chronic conditions. Safe to continue [place]p with no evidence of nephrotoxicity.

## 2022-08-08 NOTE — Assessment & Plan Note (Signed)
   Discussed importance of safe sexual practice and condom use. Condoms and STD testing offered.  Colon cancer screening up to date   Vaccinations up to date. Will be due for influenza once available  Dental appointment scheduled for end of the month.

## 2022-08-08 NOTE — Progress Notes (Signed)
Brief Narrative   Patient ID: Ryan Long, male    DOB: 11-Jan-1961, 61 y.o.   MRN: [id number]  Ryan Long is a 61 y/o AA male diagnosed with HIV disease with risk factor of MSM. Initial viral load, CD4 count and Genosure are not available. No previous history of opportunistic infection. ART experience with darunavir/ritonavir, Atripla, Isentress, Truvada, Tivicay, Descovy and now [employer].   Subjective:    Chief Complaint  Patient presents with   Follow-up   HIV Positive/AIDS    HPI:  Ryan Long is a 61 y.o. male with HIV disease last seen 06/06/22 by Magda Kiel, PharmD, CPP for HIV follow up following multiple missed appointments and returning to care at [facility] following being seen at [facility] where he had a less than optimal experience. Lab work showed an undetectable viral load and CD4 count of 382. Renal function with creatinine 1.59 and normal electrolytes Here today for follow up after several missed appointments.  Ryan Long has been taking his Biktarvy as prescribed with no adverse side effects and no problem obtaining medication from the pharmacy. Requesting a decrease in his trazodone dosage as he feels the 100 mg dose previously prescribed makes him too drowsy. Has been taking 50 mg and seems to be working well currently averaging about 50 mg. Feeling well today but has been sick over the last couple of weeks. Denies fevers, chills, night sweats, headaches, changes in vision, neck pain/stiffness, nausea, diarrhea, vomiting, lesions or rashes.  No feelings of being down, depressed or hopeless recently. No current recreational/illicit drug use or alcohol consumption. Continues to smoke tobacco daily. Condoms and STD testing offered.    No Known Allergies    Outpatient Medications Prior to Visit  Medication Sig Dispense Refill   aspirin EC 81 MG tablet Take 81 mg by mouth daily. Swallow whole.     bictegravir-emtricitabine-tenofovir AF  (BIKTARVY) 50-200-25 MG TABS tablet Take 1 tablet by mouth daily. 30 tablet 5   eplerenone (INSPRA) 25 MG tablet Take 0.5 tablets (12.5 mg total) by mouth daily. 15 tablet 11   fluticasone furoate-vilanterol (BREO ELLIPTA) 100-25 MCG/INH AEPB Inhale 1 puff into the lungs daily. 60 each 4   furosemide (LASIX) 20 MG tablet Take 1 tablet by mouth daily.     hydrOXYzine (ATARAX) 25 MG tablet TAKE 1-2 TABLETS BY MOUTH 2 TIMES DAILY AS NEEDED FOR ANXIETY. 60 tablet 3   Melatonin 10 MG CAPS Take 10 mg by mouth at bedtime as needed (sleep).     metoprolol succinate (TOPROL-XL) 50 MG 24 hr tablet Take 1 tablet (50 mg total) by mouth daily. For high blood pressure 90 tablet 3   Multiple Vitamins-Minerals (MENS MULTIPLUS PO) Take 1 tablet by mouth daily.     nicotine (NICODERM CQ - DOSED IN MG/24 HOURS) 21 mg/24hr patch Place 21 mg onto the skin daily.     potassium chloride SA (KLOR-CON M) 20 MEQ tablet TAKE 1 TABLET BY MOUTH EVERY DAY 90 tablet 3   rosuvastatin (CRESTOR) 5 MG tablet Take 1 tablet by mouth daily.     sacubitril-valsartan (ENTRESTO) 24-26 MG Take 1 tablet by mouth 2 (two) times daily. 60 tablet 1   sildenafil (VIAGRA) 50 MG tablet Take 50 mg by mouth as needed for erectile dysfunction.     tamsulosin (FLOMAX) 0.4 MG CAPS capsule Take 2 capsules (0.8 mg total) by mouth daily after supper. 120 capsule 1   valACYclovir (VALTREX) 500 MG tablet TAKE 1  TABLET BY MOUTH EVERY DAY FOR HERPES (Patient taking differently: Take 500 mg by mouth daily.) 90 tablet 4   buPROPion (WELLBUTRIN) 100 MG tablet Take by mouth.     traZODone (DESYREL) 100 MG tablet TAKE 1 TABLET BY MOUTH AT BEDTIME AS NEEDED FOR SLEEP 30 tablet 3   No facility-administered medications prior to visit.     Past Medical History:  Diagnosis Date   Active smoker    AICD (automatic cardioverter/defibrillator) present    Alcohol abuse    Anxiety    AR (allergic rhinitis)    Bipolar 1 disorder (HCC)    Cervical lymphadenitis  04/20/2021   CHF (congestive heart failure) (HCC)    Chronic bronchitis (HCC)    Chronic systolic heart failure (HCC)    Controlled substance agreement signed 10/22/2018   COPD (chronic obstructive pulmonary disease) ([place])    Cough 12/31/2018   Crack cocaine use    Depression    Genital herpes    HIV (human immunodeficiency virus infection) ([place]) dx'd 08/1993   HLD (hyperlipidemia)    Hypertension    NICM (nonischemic cardiomyopathy) ([place])    Echocardiogram 06/28/11: EF 30-35%, mild MR, mild LAE;  No CAD by coronary CT angiogram 3/12 at [facility]   NSVT (nonsustained ventricular tachycardia) ([place])    PTSD (post-traumatic stress disorder)      Past Surgical History:  Procedure Laterality Date   CARDIAC DEFIBRILLATOR PLACEMENT  01/08/2018   ICD IMPLANT N/A 01/08/2018   Procedure: ICD IMPLANT;  Surgeon: Constance Haw, MD;  Location: [facility];  Service: Cardiovascular;  Laterality: N/A;      Review of Systems  Constitutional:  Negative for appetite change, chills, fatigue, fever and unexpected weight change.  Eyes:  Negative for visual disturbance.  Respiratory:  Negative for cough, chest tightness, shortness of breath and wheezing.   Cardiovascular:  Negative for chest pain and leg swelling.  Gastrointestinal:  Negative for abdominal pain, constipation, diarrhea, nausea and vomiting.  Genitourinary:  Negative for dysuria, flank pain, frequency, genital sores, hematuria and urgency.  Skin:  Negative for rash.  Allergic/Immunologic: Negative for immunocompromised state.  Neurological:  Negative for dizziness and headaches.      Objective:    BP 94/66   Pulse 96   Temp 97.7 F (36.5 C) (Oral)   Resp 16   Ht 5' 9"  (1.753 m)   Wt 202 lb (91.6 kg)   SpO2 98%   BMI 29.83 kg/m  Nursing note and vital signs reviewed.  Physical Exam Constitutional:      General: He is not in acute distress.    Appearance: He is well-developed.  Eyes:      Conjunctiva/sclera: Conjunctivae normal.  Cardiovascular:     Rate and Rhythm: Normal rate and regular rhythm.     Heart sounds: Normal heart sounds. No murmur heard.    No friction rub. No gallop.  Pulmonary:     Effort: Pulmonary effort is normal. No respiratory distress.     Breath sounds: Normal breath sounds. No wheezing or rales.  Chest:     Chest wall: No tenderness.  Abdominal:     General: Bowel sounds are normal.     Palpations: Abdomen is soft.     Tenderness: There is no abdominal tenderness.  Musculoskeletal:     Cervical back: Neck supple.  Lymphadenopathy:     Cervical: No cervical adenopathy.  Skin:    General: Skin is warm and dry.  Findings: No rash.  Neurological:     Mental Status: He is alert and oriented to person, place, and time.  Psychiatric:        Behavior: Behavior normal.        Thought Content: Thought content normal.        Judgment: Judgment normal.         08/08/2022    8:41 AM 10/03/2021    8:00 AM 09/12/2021    8:41 AM 05/23/2021    8:42 AM 04/25/2021    8:36 AM  Depression screen PHQ 2/9  Decreased Interest 0 2 0 0 0  Down, Depressed, Hopeless 0  0 0 0  PHQ - 2 Score 0 2 0 0 0  Altered sleeping  2  1 0  Tired, decreased energy  2  0 0  Change in appetite  0  0 0  Feeling bad or failure about yourself   0  0 0  Trouble concentrating  0  0 0  Moving slowly or fidgety/restless  0  0 0  Suicidal thoughts  0  0 0  PHQ-9 Score  6  1 0       Assessment & Plan:    Patient Active Problem List   Diagnosis Date Noted   Chronic kidney disease 08/08/2022   Healthcare maintenance 08/08/2022   Hip pain 01/06/2021   VT (ventricular tachycardia) ([place]) 08/25/2020   HIV positive ([place]) 07/12/2020   Atrial fibrillation (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   AICD (automatic cardioverter/defibrillator)  present 08/15/2018   Abnormal EKG 06/28/2018   Adjustment disorder with mixed disturbance of emotions and conduct    NICM (nonischemic cardiomyopathy) ([place]) 01/08/2018   Anxiety 12/20/2017   Bipolar 1 disorder ([place]) 26/37/8588   Chronic systolic heart failure ([place]) 05/13/2017   HSV-2 infection 07/17/2016   History of attempted suicide 04/14/2016   History of alcoholism ([place]) 04/14/2016   History of substance abuse ([place]) 04/14/2016   Constipation 01/17/2016   Chronic obstructive pulmonary disease ([place]) 10/04/2015   Abnormal thyroid stimulating hormone (TSH) level 10/04/2015   MDD (major depressive disorder), recurrent episode, severe ([place]) 09/23/2015   Hypertension 09/10/2015   PTSD (post-traumatic stress disorder) 08/07/2015   Primary insomnia 04/02/2015   Herpesviral infection of penis 04/02/2015   Generalized anxiety disorder 05/29/2014   Tobacco use disorder 02/17/2013   Heart failure ([place]) 09/19/2011   Other primary cardiomyopathies 08/24/2011   Human immunodeficiency virus (HIV) disease ([place]) 02/01/2009   Recurrent HSV (herpes simplex virus) 02/01/2009   Hyperlipidemia 02/01/2009   Allergic rhinitis 02/01/2009     Problem List Items Addressed This Visit       Genitourinary   Chronic kidney disease    Ryan Long has chronic kidney disease Stage 3 with previous eGFR to 49 and creatinine 1.59. Creatinine clearance is 63. Encouraged hydration to limits allowed by his heart failure. Continue control of other chronic conditions. Safe to continue [place] with no evidence of nephrotoxicity.         Other   Human immunodeficiency virus (HIV) disease ([place]) - Primary    Ryan Long has well controlled virus with good adherence and tolerance to [employer]. Reviewed previous lab work and discussed plan of care. Continue current dose of Biktarvy. Plan for follow up in 3 months or sooner if needed with lab work 1-2 weeks prior to appointment.  Relevant Orders   Comprehensive  metabolic panel   HIV-1 RNA quant-no reflex-bld   T-helper cell (CD4)- ([facility] clinic only)   Primary insomnia    Sleeping well with 50 mg of trazodone nightly and feels to drowsy with 100 mg. Sleeping on average 7 hours per night. Decrease trazodone to 50 mg.       Healthcare maintenance    Discussed importance of safe sexual practice and condom use. Condoms and STD testing offered. Colon cancer screening up to date  Vaccinations up to date. Will be due for influenza once available Dental appointment scheduled for end of the month.       Other Visit Diagnoses     Screening for STDs (sexually transmitted diseases)       Relevant Orders   RPR   Pharmacologic therapy       Relevant Orders   Lipid panel        I have changed [place] Hole. Long's traZODone. I am also having him maintain his aspirin EC, Multiple Vitamins-Minerals (MENS MULTIPLUS PO), Melatonin, Biktarvy, sildenafil, metoprolol succinate, eplerenone, fluticasone furoate-vilanterol, valACYclovir, potassium chloride SA, buPROPion, rosuvastatin, furosemide, nicotine, Entresto, hydrOXYzine, and tamsulosin.   Meds ordered this encounter  Medications   traZODone (DESYREL) 50 MG tablet    Sig: Take 1 tablet (50 mg total) by mouth at bedtime as needed. for sleep    Dispense:  30 tablet    Refill:  5    Order Specific Question:   Supervising Provider    Answer:   Carlyle Basques [[id number]]     Follow-up: Return in about 3 months (around 11/08/2022), or if symptoms worsen or fail to improve.   Terri Piedra, MSN, FNP-C Nurse Practitioner [facility] [place] number: [phone]

## 2022-08-08 NOTE — Patient Instructions (Addendum)
Nice to see you.  Continue to take your medication daily as prescribed.  Refills have been sent to the pharmacy.  Plan for follow up in 3 months or sooner if needed with lab work 1-2 weeks prior to appointment.   Have a great day and stay safe!  

## 2022-08-08 NOTE — Assessment & Plan Note (Signed)
Sleeping well with 50 mg of trazodone nightly and feels to drowsy with 100 mg. Sleeping on average 7 hours per night. Decrease trazodone to 50 mg.

## 2022-08-09 ENCOUNTER — Encounter: Payer: Self-pay | Admitting: Critical Care Medicine

## 2022-08-09 ENCOUNTER — Telehealth: Payer: Self-pay

## 2022-08-09 ENCOUNTER — Ambulatory Visit: Payer: Medicare Other | Admitting: Family Medicine

## 2022-08-09 NOTE — Telephone Encounter (Signed)
Pt is returning Carly's call requesting a call back.   Call back -  [phone]

## 2022-08-09 NOTE — Telephone Encounter (Signed)
Pt canceled his appt with dr Alvis Lemmings at 130pm   Pls reach out to the pt to get him rescheduled with me soon ok to double book

## 2022-08-09 NOTE — Telephone Encounter (Signed)
Called patient and left voicemail about getting appointment with Ryan Long sooner than November

## 2022-08-10 NOTE — Telephone Encounter (Signed)
Appointment was made for 08/14/22

## 2022-08-11 ENCOUNTER — Telehealth (HOSPITAL_COMMUNITY): Payer: Self-pay

## 2022-08-11 NOTE — Telephone Encounter (Signed)
Called to confirm/remind patient of their appointment at the [facility] on 08/14/22.   Patient reminded to bring all medications and/or complete list.  Confirmed patient has transportation. Gave directions, instructed to utilize valet parking.  Confirmed appointment prior to ending call.   

## 2022-08-13 NOTE — Progress Notes (Deleted)
   Established Patient Office Visit  Subjective   Patient ID: Ryan Long, male    DOB: [birth year]  Age: 61 y.o. MRN: [id number]  No chief complaint on file.   HPI  {History (Optional):23778}  ROS    Objective:     There were no vitals taken for this visit. {Vitals History (Optional):23777}  Physical Exam   No results found for any visits on 08/14/22.  {Labs (Optional):23779}  The 10-year ASCVD risk score (Arnett DK, et al., 2019) is: 13.2%    Assessment & Plan:   Problem List Items Addressed This Visit   None   No follow-ups on file.    Shan Levans, MD

## 2022-08-14 ENCOUNTER — Ambulatory Visit: Payer: Medicare Other | Admitting: Critical Care Medicine

## 2022-08-14 ENCOUNTER — Encounter (HOSPITAL_COMMUNITY): Payer: Medicare Other

## 2022-08-15 ENCOUNTER — Encounter: Payer: Medicare Other | Admitting: Student

## 2022-08-15 DIAGNOSIS — I472 Ventricular tachycardia, unspecified: Secondary | ICD-10-CM

## 2022-08-15 DIAGNOSIS — I428 Other cardiomyopathies: Secondary | ICD-10-CM

## 2022-08-15 DIAGNOSIS — I5022 Chronic systolic (congestive) heart failure: Secondary | ICD-10-CM

## 2022-08-15 DIAGNOSIS — Z720 Tobacco use: Secondary | ICD-10-CM

## 2022-08-23 ENCOUNTER — Ambulatory Visit: Payer: Medicare Other | Admitting: Physician Assistant

## 2022-09-28 ENCOUNTER — Other Ambulatory Visit: Payer: Self-pay | Admitting: Critical Care Medicine

## 2022-10-12 ENCOUNTER — Other Ambulatory Visit: Payer: Self-pay | Admitting: Critical Care Medicine

## 2022-10-12 ENCOUNTER — Encounter: Payer: Self-pay | Admitting: Critical Care Medicine

## 2022-10-14 MED ORDER — EPLERENONE 25 MG PO TABS: ORAL_TABLET | ORAL | 0 refills | Status: DC

## 2022-11-02 ENCOUNTER — Other Ambulatory Visit: Payer: Medicare Other

## 2022-11-07 ENCOUNTER — Telehealth (HOSPITAL_COMMUNITY): Payer: Self-pay

## 2022-11-07 ENCOUNTER — Ambulatory Visit: Payer: Medicare Other | Attending: Cardiology | Admitting: Cardiology

## 2022-11-07 ENCOUNTER — Other Ambulatory Visit: Payer: Medicare Other

## 2022-11-07 NOTE — Telephone Encounter (Signed)
Called and left a message to confirm/remind patient of their appointment at the [facility] on 11/08/22.

## 2022-11-08 ENCOUNTER — Encounter (HOSPITAL_COMMUNITY): Payer: Medicare Other

## 2022-11-08 NOTE — Progress Notes (Incomplete)
[facility] Note   Patient ID: Ryan Long, male   DOB: 1961-11-13, 61 y.o.   MRN: [id number] ID: Dr Daiva Eves PCP: Dr Armen Pickup EP : Dr Elberta Fortis  HF: Dr Launa Grill Travonta Gill is a 61 y.o. male with HIV, HTN, major depressive disorder, chronic systolic heart failure (nonischemic cardiomyopathy), and polysubstance abuse.  Coronary CT angiogram in 3/12 demonstrated no CAD.  Cardiolite in 2015 showed no ischemia or infarction.  NICM was not felt to be related to HIV given his good CD4 counts. He is S/P St Jude ICD 12/2017.  ECHO 09/2017 EF 25-30%   He was evaluated [facility] 12/25/17 with suicidal ideation. He was placed on IVC with psych consulted. He was later cleared and discharged.   Echo 04/20/21 EF 20-25% mild MR. RV ok   Last seen in the [facility] 5/22. At visit, he endorsed stable NYHA Class II symptoms and was stable from volume standpoint. Given persistently reduced EF, he was ordered to do CPX test to objectively quantify exercise capacity. Unfortunately, patient failed to complete study and has not followed up since.   Returns today for f/u     Studies:  ECHO 06/2013 EF 35-40% ECHO 9/16 with EF 30-35%, diffuse hypokinesis, grade I diastolic dysfunction    Past Medical History:  Diagnosis Date   Active smoker    AICD (automatic cardioverter/defibrillator) present    Alcohol abuse    Anxiety    AR (allergic rhinitis)    Bipolar 1 disorder (HCC)    Cervical lymphadenitis 04/20/2021   CHF (congestive heart failure) (HCC)    Chronic bronchitis (HCC)    Chronic systolic heart failure (HCC)    Controlled substance agreement signed 10/22/2018   COPD (chronic obstructive pulmonary disease) (HCC)    Cough 12/31/2018   Crack cocaine use    Depression    Genital herpes    HIV (human immunodeficiency virus infection) (HCC) dx'd 08/1993   HLD (hyperlipidemia)    Hypertension    NICM (nonischemic cardiomyopathy) (HCC)    Echocardiogram 06/28/11: EF 30-35%,  mild MR, mild LAE;  No CAD by coronary CT angiogram 3/12 at [facility]   NSVT (nonsustained ventricular tachycardia) (HCC)    PTSD (post-traumatic stress disorder)     Current Outpatient Medications  Medication Sig Dispense Refill   aspirin EC 81 MG tablet Take 81 mg by mouth daily. Swallow whole.     bictegravir-emtricitabine-tenofovir AF (BIKTARVY) 50-200-25 MG TABS tablet Take 1 tablet by mouth daily. 30 tablet 5   eplerenone (INSPRA) 25 MG tablet TAKE 0.5 TABLET BY MOUTH EVERY DAY 45 tablet 0   fluticasone furoate-vilanterol (BREO ELLIPTA) 100-25 MCG/INH AEPB Inhale 1 puff into the lungs daily. 60 each 4   furosemide (LASIX) 20 MG tablet Take 1 tablet by mouth daily.     hydrOXYzine (VISTARIL) 25 MG capsule Take 25 mg by mouth 2 (two) times daily.     Melatonin 10 MG CAPS Take 10 mg by mouth at bedtime as needed (sleep).     metoprolol succinate (TOPROL-XL) 50 MG 24 hr tablet Take 1 tablet (50 mg total) by mouth daily. For high blood pressure 90 tablet 3   Multiple Vitamins-Minerals (MENS MULTIPLUS PO) Take 1 tablet by mouth daily.     nicotine (NICODERM CQ - DOSED IN MG/24 HOURS) 21 mg/24hr patch      potassium chloride SA (KLOR-CON M) 20 MEQ tablet TAKE 1 TABLET BY MOUTH EVERY DAY 90 tablet 3  rosuvastatin (CRESTOR) 5 MG tablet Take 1 tablet by mouth daily.     sacubitril-valsartan (ENTRESTO) 24-26 MG Take 1 tablet by mouth 2 (two) times daily. 60 tablet 1   sildenafil (VIAGRA) 50 MG tablet Take 50 mg by mouth as needed for erectile dysfunction.     tamsulosin (FLOMAX) 0.4 MG CAPS capsule TAKE 2 CAPSULES(0.8 MG) BY MOUTH DAILY AFTER SUPPER 60 capsule 0   traZODone (DESYREL) 50 MG tablet Take 1 tablet (50 mg total) by mouth at bedtime as needed. for sleep 30 tablet 5   valACYclovir (VALTREX) 500 MG tablet Take 1 tablet by mouth daily.     Varenicline Tartrate, Starter, 0.5 MG X 11 & 1 MG X 42 TBPK Take one 0.5 mg tab by mouth once daily for 3 days, increase to one 0.5  mg tab twice daily for 4 days, increase to 1 mg tab twice daily.     No current facility-administered medications for this visit.    Allergies: No Known Allergies   Review of systems complete and found to be negative unless listed in HPI.    Vital Signs:  There were no vitals filed for this visit.   Wt Readings from Last 3 Encounters:  08/08/22 91.6 kg (202 lb)  09/26/21 92.5 kg (204 lb)  09/12/21 95.3 kg (210 lb)    PHYSICAL EXAM: General:  Well appearing. No resp difficulty HEENT: normal Neck: supple. no JVD. Carotids 2+ bilat; no bruits. No thryomegaly appreciated. Swollen L cervical LN Cor: PMI nondisplaced. Regular rate & rhythm. No rubs, gallops or murmurs. Lungs: clear Abdomen: soft, nontender, nondistended. No hepatosplenomegaly. No bruits or masses. Good bowel sounds. Extremities: no cyanosis, clubbing, rash, edema Neuro: alert & orientedx3, cranial nerves grossly intact. moves all 4 extremities w/o difficulty. Affect pleasant   ASSESSMENT AND PLAN:   1. Chronic Systolic Heart Failure: Nonischemic cardiomyopathy.   - Echo 09/2017 EF 20-25%.  - Echo today 04/20/21 EF 20-25% mild MR. RV ok Personally reviewed - S/P St Jude ICD  - Stable NYHA II  Volume status stable off lasix   - Continue Entresto 49/51 mg bid  - Continue Toprol xl  25 mg daily. - Continue inspra 12.5 mg daily - No SGLT2i yet with dizziness - Overall stable but given persistently reduced EF will get CPX test to objectively quantify exercise capacity  2. HIV:  - He has follow up with Dr Daiva Eves.     3. Tobacco use disorder:   - Back to smoking about 3/4 ppd. We discussed use of nicotine lozenges or patches  4. Former Cocaine - Still using on occasion  - Encouraged to quit   Robbie Lis, PA-C  11/08/2022

## 2022-11-12 ENCOUNTER — Encounter: Payer: Self-pay | Admitting: Critical Care Medicine

## 2022-11-13 MED ORDER — EPLERENONE 25 MG PO TABS: ORAL_TABLET | ORAL | 6 refills | Status: DC

## 2022-11-15 ENCOUNTER — Encounter: Payer: Self-pay | Admitting: Critical Care Medicine

## 2022-11-15 ENCOUNTER — Other Ambulatory Visit: Payer: Medicare Other

## 2022-11-15 ENCOUNTER — Ambulatory Visit
Admission: RE | Admit: 2022-11-15 | Discharge: 2022-11-15 | Disposition: A | Discharge status: Home / Self Care | Payer: Medicare Other | Source: Ambulatory Visit | Attending: Critical Care Medicine | Admitting: Critical Care Medicine

## 2022-11-15 ENCOUNTER — Ambulatory Visit: Payer: Medicare Other | Attending: Critical Care Medicine | Admitting: Critical Care Medicine

## 2022-11-15 VITALS — BP 97/69 | HR 96 | Wt 202.0 lb

## 2022-11-15 DIAGNOSIS — R200 Anesthesia of skin: Secondary | ICD-10-CM | POA: Insufficient documentation

## 2022-11-15 DIAGNOSIS — I5022 Chronic systolic (congestive) heart failure: Secondary | ICD-10-CM

## 2022-11-15 DIAGNOSIS — Z0001 Encounter for general adult medical examination with abnormal findings: Secondary | ICD-10-CM

## 2022-11-15 DIAGNOSIS — J441 Chronic obstructive pulmonary disease with (acute) exacerbation: Secondary | ICD-10-CM

## 2022-11-15 DIAGNOSIS — Z0000 Encounter for general adult medical examination without abnormal findings: Secondary | ICD-10-CM

## 2022-11-15 DIAGNOSIS — I10 Essential (primary) hypertension: Secondary | ICD-10-CM

## 2022-11-15 DIAGNOSIS — R3911 Hesitancy of micturition: Secondary | ICD-10-CM

## 2022-11-15 DIAGNOSIS — I700 Atherosclerosis of aorta: Secondary | ICD-10-CM | POA: Diagnosis not present

## 2022-11-15 DIAGNOSIS — B20 Human immunodeficiency virus [HIV] disease: Secondary | ICD-10-CM

## 2022-11-15 DIAGNOSIS — A6001 Herpesviral infection of penis: Secondary | ICD-10-CM

## 2022-11-15 DIAGNOSIS — Z9151 Personal history of suicidal behavior: Secondary | ICD-10-CM

## 2022-11-15 DIAGNOSIS — N1831 Chronic kidney disease, stage 3a: Secondary | ICD-10-CM

## 2022-11-15 DIAGNOSIS — N401 Enlarged prostate with lower urinary tract symptoms: Secondary | ICD-10-CM

## 2022-11-15 DIAGNOSIS — F1911 Other psychoactive substance abuse, in remission: Secondary | ICD-10-CM

## 2022-11-15 DIAGNOSIS — F17200 Nicotine dependence, unspecified, uncomplicated: Secondary | ICD-10-CM

## 2022-11-15 MED ORDER — FLUTICASONE FUROATE-VILANTEROL 100-25 MCG/ACT IN AEPB: 1.0000 | INHALATION_SPRAY | Freq: Every day | RESPIRATORY_TRACT | 11 refills | Status: DC

## 2022-11-15 MED ORDER — PREDNISONE 10 MG PO TABS: ORAL_TABLET | ORAL | 0 refills | Status: DC

## 2022-11-15 MED ORDER — SILDENAFIL CITRATE 50 MG PO TABS: 50.0000 mg | ORAL_TABLET | ORAL | 3 refills | Status: DC | PRN

## 2022-11-15 MED ORDER — TRAZODONE HCL 50 MG PO TABS: 50.0000 mg | ORAL_TABLET | Freq: Every evening | ORAL | 5 refills | Status: DC | PRN

## 2022-11-15 MED ORDER — POTASSIUM CHLORIDE CRYS ER 20 MEQ PO TBCR: 20.0000 meq | EXTENDED_RELEASE_TABLET | Freq: Every day | ORAL | 3 refills | Status: DC

## 2022-11-15 MED ORDER — VALACYCLOVIR HCL 500 MG PO TABS: 500.0000 mg | ORAL_TABLET | Freq: Every day | ORAL | 4 refills | Status: DC

## 2022-11-15 MED ORDER — HYDROXYZINE HCL 25 MG PO TABS: 25.0000 mg | ORAL_TABLET | Freq: Two times a day (BID) | ORAL | 5 refills | Status: DC | PRN

## 2022-11-15 MED ORDER — ROSUVASTATIN CALCIUM 5 MG PO TABS: 5.0000 mg | ORAL_TABLET | Freq: Every day | ORAL | 2 refills | Status: DC

## 2022-11-15 MED ORDER — AZITHROMYCIN 250 MG PO TABS: ORAL_TABLET | ORAL | 0 refills | Status: DC

## 2022-11-15 MED ORDER — TAMSULOSIN HCL 0.4 MG PO CAPS: ORAL_CAPSULE | ORAL | 5 refills | Status: DC

## 2022-11-15 MED ORDER — FUROSEMIDE 20 MG PO TABS: 20.0000 mg | ORAL_TABLET | Freq: Every day | ORAL | 3 refills | Status: DC

## 2022-11-15 MED ORDER — METOPROLOL SUCCINATE ER 50 MG PO TB24: 50.0000 mg | ORAL_TABLET | Freq: Every day | ORAL | 3 refills | Status: DC

## 2022-11-15 NOTE — Progress Notes (Deleted)
Established Patient Office Visit  Subjective:  Patient ID: Ryan Long, male    DOB: 12/20/1960  Age: 61 y.o. MRN: [id number]  CC:  No chief complaint on file.   HPI 09/08/22 Ryan Long presents for primary care follow-up visit.  Note patient has been seen over the summer at [facility] for cardiac  care and also seen locally for cardiology.  Patient has ejection fraction 25% has an AICD in place and had an episode September 12 where he became lightheaded and went to the emergency room.  He had previously had his AICD fired on August 25 but he did not seek evaluation at that time.  Below is documentation from the September ER visit.  He did not stay for completion of evaluation and left AGAINST MEDICAL ADVICE.  Recent hosp stay/ED visits and w/u at [facility] and here with cardiology   08/29/21 ED Ryan Long is a 61 y.o. male who presents with dizziness. He was at the heart failure clinic today when he had onset of light headedness, weakness and tunnel vision. He states: " I feel like I am going to fall out." He has been having these symptoms daily for the past several weeks. His ICD fired on Aug 25, but he did not seek evaluation at that time. He notes associated palpitations. He denies vertigo.  Patient appears irritated. He states that he does not want to be seen in trauma a room because "there is no TV or no food in here."  Patient also states "I have insurance."  I explained to the patient that we got him into the first available room because he has symptoms very concerning for potential arrhythmia.  I discussed with the patient that he could be having life-threatening arrhythmias and potentially needs some adjustment to his defibrillator given his history of heart failure with an EF of less than 25%.  The patient understands fully that this could be a life-threatening issue leading to potential severe morbidity or mortality.  He refuses any further services  and will leave [place].Date: 08/29/2021 Patient: Ryan Long Admitted: 08/29/2021  9:39 AM Attending Provider: Blanchie Dessert, MD   Ryan Long or his authorized caregiver has made the decision for the patient to leave the emergency department against the advice of [facility]   He or his authorized caregiver has been informed and understands the inherent risks, including death.  He or his authorized caregiver has decided to accept the responsibility for this decision. Weber Cooks Erekson and all necessary parties have been advised that he may return for further evaluation or treatment. His condition at time of discharge was Stable.  Weber Cooks Crooker had current vital signs as follows:   Blood pressure 109/69, pulse 95, temperature 98.4 F (36.9 C), temperature source Temporal, resp. rate 20, height _0  (1.753 m), weight 91.3 kg, SpO2 100 %.          Weber Cooks Guidry or his authorized caregiver has not signed the Leaving Against Medical Advice form prior to leaving the department.  Below is a prior documentation from a visit at [facility] cardiology office in July Cardiology [place] 06/2021 at [facility] Cardiovascular Problem List:  1. Nonischemic cardiomyopathy  A. TTE (02/26/14):  The left ventricular size is normal.  There is mild concentric left ventricular hypertrophy.  There is mild global hypokinesis of the left ventricle.  LVEF 45-50%. The right ventricle is normal in size and function.  No significant stenosis or  regurgitation seen  There is no pericardial effusion.  There is no comparison study available.  B. ESE (03/03/14):  The patient had chest pain in both stress and recovery.  The patient achieved 91 % of maximum predicted heart rate.  The METS achieved was 7.  Exercise capacity was fair.  Normal left ventricular function and global wall motion with stress.  Negative exercise echocardiography for inducible ischemia at  target heart  rate.  Negative stress ECG for inducible ischemia at target heart rate.   C. 2019 Implantation of primary prevention [place] SJM ICD by Dr. Allegra Lai at [place]. TTE [facility] 03/04/19):  LVEF 25%, mild aortic stenosis, moderate mitral regurgitation, normal RV size and function  SUBJECTIVE: Ryan Long is a 69 AAM with the above cardiovascular problem list who I last saw with my cardiology fellow, Dr. Santiago Bur on 07/08/19. The patient did not showed for cardiology clinic visit on 05/25/20. He now presents for "follow-up".  The patient is a poor historian and has had sporadic follow-up at [facility]. He has a history of heart failure with reduced ejection fraction secondary to nonischemic cardiomyopathy thought to be related to poorly controlled HIV. He has had placement of [employer] single-chamber ICD for primary prevention in January 2019. His clinical course is confounded by bipolar 1 disorder and polysubstance abuse. The patient has seen multiple providers in the area including [facility] and [facility] over the last several years. He most recently had been followed at [facility] [place] by Dr. Blanche East at Dr. Allegra Lai, EP. The patient now tells me that he now wants to be seen exclusively at [facility]. The patient had been seen by Dr. Gelene Mink in the past for his ICD device - but the last monitoring of this was on 07/12/20. The patient now tells me that he wants to re-establish with Dr. Gelene Mink and is scheduled to see him in December.  The patient denied chest pain. He has SOB on moderate to strenuous exertion. He denied PND, orthopnea, or pedal edema. The patient has not experienced palpitations, dizziness, or syncope. The patient tells me that he wants to "go back to work" but the cardiologists at [facility] wanted him to have an exercise stress echo to see what his functional status may be. The patient has been frustrated because two exercise stress echoes have been  rescheduled at [facility] and that is why he is here today. He wants to have the stress test done here.  PMH: Past Medical History:  Diagnosis Date   Anxiety   CHF (congestive heart failure) ([place])   Depression   Human immunodeficiency virus (HIV) disease ([place])   Hypertension   ICD (implantable cardioverter-defibrillator) in place   Mood disorder ([place])   Polysubstance dependence ([place])  Social History Narrative  Born in [place], Mr. Wholey indicated that he moved around a lot as a child due to his father having been in the Army. The family settled in [place] after his father retired from [employer]. Recalling that his parents fought, he said they separated when he was about age 26. After the separation, he said, he had to fight off sexual advances from his mother. His parents divorced in about 67, and his father died two years later. His mother reportedly lives in [place]. The older of two children, he noted having a brother who works as a Dealer in [place] whom he has not seen in over five years.   From 1981-82 Mr. Villagran studied  at [employer] in [place]. In August 2014 he enrolled in a business administration program at [employer] in [place] but he dropped out in December in the wake of being denied disability, losing his housing support, and being hospitalized for depression. He appealed the disability decision and was awarded benefits in September 2015. Since receiving his benefits he has moved among [place], [place], and [place] living in [place], motel rooms, or shelters while trying to find a stable residence.   Never married, Mr. Gutman said he dates women and that his longest relationship lasted about one year. Not currently in a relationship, he indicated having no children. Describing himself as Mina Marble, he indicated that he has attended church some in the past year or so. Saying that socially he mostly stays to himself, Mr. Vanpatten said he likes watching  sports, playing ping-pong or billiards, and going to movies.   Immunizations Immunization History  Administered Date(s) Administered   Flu Vaccine 51moand up (FLUZONE/FLULAVAL/FLUARIX VIAL OR SYRINGE) INACTIVATED QUADRAVALENT 08/15/2019   Influenza TIV (IM) 09/29/2010   Influenza Whole 09/20/2009, 08/24/2011   Janssen SARS-CoV-2 Vaccine 03/05/2020   Tdap ([place]ACEL, [place] 11/19/2007, 02/10/2014   hepatitis B (ENGERIX-B, RECOMBIVAX HB) 08/24/2011, 09/25/2011   influenza (Historical) 11/17/2008, 09/29/2010, 09/25/2011, 09/16/2012, 10/16/2013, 12/22/2013, 08/16/2015, 10/10/2015, 11/17/2016, 09/12/2017, 10/18/2017, 09/04/2018, 10/29/2019   pneumococcal conjugate 13-valent (PREVNAR) 02/10/2014   pneumococcal polysaccharide 23-valent (PNEUMOVAX 23) 05/23/2004, 10/01/2009, 01/17/2016   tuberculin PPD Test (APLISOL, TUBERSOL) 11/19/2007, 11/01/2010   zoster vaccine ([facility] (Historical) 06/13/2018, 08/13/2018   ?  I personally reviewed the patient's general problem list: Patient Active Problem List  Diagnosis   Human immunodeficiency virus (HIV) disease ([place]   Major depression, recurrent ([place]   Anxiety   HFrEF (heart failure with reduced ejection fraction) (HCC)   Non-ischemic cardiomyopathy (HCC)   Cannabis abuse, episodic   Cocaine abuse, episodic (HCC)   Primary insomnia   HSV-2 infection   Achilles tendinitis, left leg   Encounter to establish care   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[place]   Tobacco use disorder   Hyperlipidemia   History of attempted suicide   History of alcoholism ([place]   Cocaine use disorder, severe, dependence ([place]   BPH (benign prostatic hyperplasia)   Atrial fibrillation ([place]   Alcohol use disorder, severe, dependence ([place]   AICD (automatic cardioverter/defibrillator) present   PTSD (post-traumatic stress disorder)   I personally reviewed his Care Everywhere chart. Of note: Coronary CTA done at [facility] (03/12/11): 1. Normal appearing coronary arteries.  2.  Severe left ventricular dysfunction with moderate dilated cardiomyopathy.   ASSESSMENT: The patient has been carried as a non-ischemic cardiomyopathy of unclear etiology. He has known LVEF of 20-25% and is s/p single chamber ICD. He seems to be well compensated on his current medical regimen including Entresto.   The patient is insistent that he was [facility] His DOE could be an anginal equivalent. Also will get a better idea of his functional status. Will order an exercise stress echo. If negative for coronary ischemia - we can reassure the patient and referring physician and look into other etiologies for the symptoms. If positive for coronary ischemia - then the patient will need a definitive cardiac cath to guide therapy (meds vs PCI vs CABG) if obstructive coronary artery disease is present.   The patient has been hard to follow as he has been sporadic in keeping his return visits. He has a myriad of medical issues  confounding his cardiovascular state.   Discussed with patient - he appears to understand and agree to the plan as outlined  PLAN: 1. Continue current medications: Current Outpatient Medications  Medication Sig Dispense Refill   aspirin 81 MG EC tablet *ANTIPLATELET* Take by mouth daily.   bictegravir-emtricitabine-tenofovir alafenamide (BIKTARVY) 50-200-25 mg per tablet Take 1 tablet by mouth daily. 90 tablet 3   eplerenone (INSPRA) 25 MG tablet TAKE 1/2 TABLET BY MOUTH EVERY DAY 45 tablet 3   fluticasone furoate-vilanteroL (BREO ELLIPTA) 100-25 mcg/dose inhaler Inhale 1 puff into the lungs daily.   hydrOXYzine pamoate (VISTARIL) 25 mg capsule Take 1 capsule (25 mg total) by mouth 2 (two) times daily as needed for Anxiety. 30 capsule 3   melatonin 10 mg capsule Take 10 mg by mouth at bedtime.   metoPROLOL succinate (TOPROL-XL) 25 MG 24 hr tablet Take 1 tablet (25 mg total) by mouth daily. 90 tablet 3   multivit-min-FA-lycopen-lutein (MEN  50 PLUS MULTIVITAMIN) 300-600-300 mcg Tab Take by mouth.   nicotine (NICODERM CQ) 21 mg/24 hr Place 1 patch onto the skin daily. 28 patch 1   sacubitriL-valsartan (ENTRESTO) 24-26 mg per tablet Take 1 tablet by mouth 2 times daily.   sildenafiL (VIAGRA) 50 MG tablet TK 1 T PO QD 1 HOUR B SEXUAL ACTIVITY PRN   tamsulosin (FLOMAX) 0.4 mg Cap capsule TAKE 2 CAPSULES BY MOUTH EVERY DAY FOR BPH 180 capsule 3   traZODone (DESYREL) 100 MG tablet Take 1 tablet (100 mg total) by mouth nightly. 30 tablet 3   valACYclovir (VALTREX) 500 MG tablet Take 1 tablet (500 mg total) by mouth daily. 90 tablet 3   rivaroxaban (XARELTO) 20 mg tablet *ANTICOAGULANT* Take 1 tablet (20 mg total) by mouth daily with dinner. 30 tablet 11   No current facility-administered medications for this visit.   2. Follow a low carbohydrate, high protein, low glycemic diet.  3. Avoid excessive salt intake. 4. Try to get regular exercise at least 30 minutes (no more, no less) at least every other day.  5. Consistency of exercise is more important than duration or intensity. 6. Call 911 and go to the nearest emergency room for any episode of severe chest pain lasting longer than 15 minutes. 7. Give Cardiology a phone call [phone]) if you develop increased exertional chest pain or shortness of breath. 8. Have ordered an exercise stress echo to assess coronary flow and heart muscle function. Will be in touch with you about the results.  9. Regular follow-up visits with your primary care physician. 10. Keep appointment with Dr. Gelene Mink in December 11. Cardiology follow-up visit in 2 months.  The patient is subsequently seen cardiology again just a week ago.  Patient had his ICD interrogated and this showed he had episodes of ventricular fibrillation and nonsustained ventricular tachycardia which caused his device to firing August 25.  He has had no further device firing since.  Note patient has not been using cocaine in the past  year.  He has severe PTSD.  He is living in a shared housing situation with 3 other individuals all of whom are on crack cocaine.  Patient does note increased wheezing and productive cough of thick green mucus over the last several days.  Patient is compliant with all his medications.  His dose of Entresto was reduced.  Cardiology would like a repeat metabolic panel we will obtain this today.  Patient is maintained on aspirin, Biktarvy, Inspra, Breo, metoprolol, potassium, tamsulosin, trazodone, Valtrex  Patient just received his COVID booster which is the [employer] booster and had a significant reaction to this.  He declines to receive the flu vaccine at this visit.  Patient is still smoking about a half a pack a day of cigarettes and does know the importance of reducing further.  Patient has a stress test coming up later in the week per cardiology he is encouraged to keep this appointment.  Due to his PTSD the patient does drink about 2  24 ounce bottles of beer about every other day  11/15/22 Not seen in one year Was seeing PCP in [facility]: 09/11/22: Ryan Long is a 61 y.o. male who presents to the office today for reestablishing chronic care and complaints of a rash. States he's had a pruritic rash in the bilateral axillary region for the last 3 to 4 months. States he has tried over-the-counter hydrocortisone and athlete's foot medications without any relief. Denies any other associated symptoms. Medical history significant for chronic HIV infection, heart failure reduced ejection fraction with an ICD/pacemaker, CKD stage II, chronic bronchitis, current smoker with greater than 20-pack-year history, polycythemia, chronic neutropenia, vitamin D deficiency, and hyperlipidemia. Patient follows with ID for HIV management, currently on Biktarvy and prophylactic Valtrex. He has also been seen by cardiology most recently for complaints of syncopal episodes. States that he would sometimes  experience lightheadedness for unknown causes. A Holter monitor was placed however he self removed due to subjective cost. He denies any palpitations, flutters, or strokelike symptoms. Last TTE was completed on 12/13/2021 showing an EF of 25 to 30%, with severe reduced systolic function, global hypokinesis of left ventricle, mild dilation of RV, LA, and RA, mild MR. Current relevant medications include metoprolol XL 50 mg, furosemide 20 mg, eplerenone 25 mg, and Entresto 24-26 mg. He has been seen by hematology in the past for polycythemia and chronic neutropenia, however does not regularly follow with anyone. He denies any headaches, chest pains, palpitations, dyspnea, pedal edema, orthopnea, or any recurrent infections. Born in [place], Mr. Nagy indicated that he moved around a lot as a child due to his father having been in the Army. The family settled in [place] after his father retired from [employer]. Recalling that his parents fought, he said they separated when he was about age 70. After the separation, he said, he had to fight off sexual advances from his mother. His parents divorced in about 71, and his father died two years later. His mother reportedly lives in [place]. The older of two children, he noted having a brother who works as a Dealer in [place] whom he has not seen in over five years.  From 1981-82 Mr. Vanhorne studied at [employer] in [place]. In August 2014 he enrolled in a business administration program at [employer] in [place] but he dropped out in December in the wake of being denied disability, losing his housing support, and being hospitalized for depression. He appealed the disability decision and was awarded benefits in September 2015. Since receiving his benefits he has moved among [place], [place], and [place] living in [place], motel rooms, or shelters while trying to find a stable residence.  Never married, Mr. Langlinais said he dates  women and that his longest relationship lasted about one year. Not currently in a relationship, he indicated having no children. Describing himself as Mina Marble, he indicated that he has attended church some in the past year or so. Saying that socially he mostly  stays to himself, Mr. Helmstetter said he likes watching sports, playing ping-pong or billiards, and going to movies.   1. HFrEF (heart failure with reduced ejection fraction) ([place]) 2. Simple chronic bronchitis (HCC) 3. Stage 2 chronic kidney disease 4. Polycythemia 5. Cigarette smoker 6. Rash 7. Mixed hyperlipidemia 8. Vitamin D deficiency 9. Erectile dysfunction due to diseases classified elsewhere  Plan Comprehensive pertinent labs including lipids and vitamin D ordered to be checked prior to next office visit. Most recent CBC revealed chronic polycythemia and chronic neutropenia with a ANC of 800. Referral was sent back to hematology for this reason. Given 7-day supply of fluconazole for suspected Candida of bilateral axilla. If symptoms do not resolve will refer to topical triamcinolone or mometasone due to CHF. After discussion patient was agreeable for referral lung cancer annual screening. Patient was advised to discuss with his cardiologist if he should be taking furosemide and eplerenone concomitantly. Patient endorses regular use of Breo Ellipta inhaler for COPD maintenance without any recurrences of oral thrush. Patient requests refill on sildenafil refill however his insurance does not cover the 50 mg or 100 mg dosages. Patient given 90-day supplies on other pertinent medications including rosuvastatin, Breo Ellipta, and Toprol-XL.  Medications Prescribed Orders Placed This Encounter Medications  fluconazole (DIFLUCAN) 100 MG tablet Sig: Take 1 tablet (100 mg total) by mouth daily for 7 days. Dispense: 7 tablet Refill: 0  rosuvastatin (CRESTOR) 5 MG tablet Sig: Take 1 tablet (5 mg total) by mouth daily. Dispense: 90  tablet Refill: 0  BREO ELLIPTA 100-25 mcg/dose inhaler Sig: Inhale 1 puff into the lungs daily. Dispense: 60 each Refill: 2  metoPROLOL succinate (TOPROL-XL) 50 MG 24 hr tablet Sig: Take 1 tablet (50 mg total) by mouth daily. Dispense: 90 tablet Refill: 0  sildenafiL, pulm.hypertension, (REVATIO) 20 mg tablet Sig: Take 1 tablet (20 mg total) by mouth 3 times daily for 10 days. Dispense: 30 tablet Refill: 0  Other Orders Orders Placed This Encounter Procedures  CT LUNG SCREEN WO CONTRAST ANNUAL  Lipid Profile  25(OH) Vitamin D Total  Comprehensive metabolic panel  CBC And Differential  Ambulatory referral to Hematology / Oncology     Past Medical History:  Diagnosis Date   Active smoker    AICD (automatic cardioverter/defibrillator) present    Alcohol abuse    Anxiety    AR (allergic rhinitis)    Bipolar 1 disorder (HCC)    Cervical lymphadenitis 04/20/2021   CHF (congestive heart failure) (HCC)    Chronic bronchitis (HCC)    Chronic systolic heart failure (HCC)    Controlled substance agreement signed 10/22/2018   COPD (chronic obstructive pulmonary disease) ([place])    Cough 12/31/2018   Crack cocaine use    Depression    Genital herpes    HIV (human immunodeficiency virus infection) ([place]) dx'd 08/1993   HLD (hyperlipidemia)    Hypertension    NICM (nonischemic cardiomyopathy) ([place])    Echocardiogram 06/28/11: EF 30-35%, mild MR, mild LAE;  No CAD by coronary CT [facility] 3[facility]12 [facility]   NSVT (nonsustained ventricular tachycardia) ([place])    PTSD (post-traumatic stress disorder)     Past Surgical History:  Procedure Laterality Date   CARDIAC DEFIBRILLATOR PLACEMENT  01/08/2018   ICD IMPLANT N/A 01/08/2018   Procedure: ICD IMPLANT;  Surgeon: Constance Haw, MD;  Location: [facility];  Service: Cardiovascular;  Laterality: N/A;    Family History  Problem Relation Age of Onset   Glaucoma Mother  Mental illness Mother    Vision  loss Mother    Hypertension Father    CAD Father    Mental illness Father    Alcohol abuse Father    Drug abuse Father    Heart attack Father 21   Early death Father    Alcohol abuse Brother    Drug abuse Brother    Drug abuse Brother     Social History   Socioeconomic History   Marital status: Single    Spouse name: Not on file   Number of children: Not on file   Years of education: Not on file   Highest education level: Not on file  Occupational History   Not on file  Tobacco Use   Smoking status: Every Day    Packs/day: 0.75    Years: 38.00    Total pack years: 28.50    Types: Cigarettes   Smokeless tobacco: Never   Tobacco comments:    hoping welbutrin will help  Vaping Use   Vaping Use: Never used  Substance and Sexual Activity   Alcohol use: Not Currently    Alcohol/week: 6.0 standard drinks of alcohol    Types: 6 Cans of beer per week    Comment: 01/08/2018 "stopped 06/2017"   Drug use: Not Currently    Types: Cocaine    Comment: 01/08/2018 "stopped 06/2017"   Sexual activity: Yes    Birth control/protection: Condom    Comment: pt. declined condoms  Other Topics Concern   Not on file  Social History Narrative   Not on file   Social Determinants of Health   Financial Resource Strain: Not on file  Food Insecurity: Not on file  Transportation Needs: Not on file  Physical Activity: Not on file  Stress: Not on file  Social Connections: Not on file  Intimate Partner Violence: Not on file    Outpatient Medications Prior to Visit  Medication Sig Dispense Refill   aspirin EC 81 MG tablet Take 81 mg by mouth daily. Swallow whole.     bictegravir-emtricitabine-tenofovir AF (BIKTARVY) 50-200-25 MG TABS tablet Take 1 tablet by mouth daily. 30 tablet 5   eplerenone (INSPRA) 25 MG tablet TAKE 0.5 TABLET BY MOUTH EVERY DAY 45 tablet 6   fluticasone furoate-vilanterol (BREO ELLIPTA) 100-25 MCG/INH AEPB Inhale 1 puff into the lungs daily. 60 each 4   furosemide  (LASIX) 20 MG tablet Take 1 tablet by mouth daily.     hydrOXYzine (ATARAX) 25 MG tablet Take 25-50 mg by mouth 2 (two) times daily as needed.     hydrOXYzine (VISTARIL) 25 MG capsule Take 25 mg by mouth 2 (two) times daily.     Melatonin 10 MG CAPS Take 10 mg by mouth at bedtime as needed (sleep).     metoprolol succinate (TOPROL-XL) 50 MG 24 hr tablet Take 1 tablet (50 mg total) by mouth daily. For high blood pressure 90 tablet 3   Multiple Vitamins-Minerals (MENS MULTIPLUS PO) Take 1 tablet by mouth daily.     nicotine (NICODERM CQ - DOSED IN MG/24 HOURS) 21 mg/24hr patch      potassium chloride SA (KLOR-CON M) 20 MEQ tablet TAKE 1 TABLET BY MOUTH EVERY DAY 90 tablet 3   rosuvastatin (CRESTOR) 5 MG tablet Take 1 tablet by mouth daily.     sacubitril-valsartan (ENTRESTO) 24-26 MG Take 1 tablet by mouth 2 (two) times daily. 60 tablet 1   sildenafil (VIAGRA) 50 MG tablet Take 50 mg by mouth as needed  for erectile dysfunction.     tamsulosin (FLOMAX) 0.4 MG CAPS capsule TAKE 2 CAPSULES(0.8 MG) BY MOUTH DAILY AFTER SUPPER 60 capsule 0   traZODone (DESYREL) 50 MG tablet Take 1 tablet (50 mg total) by mouth at bedtime as needed. for sleep 30 tablet 5   valACYclovir (VALTREX) 500 MG tablet Take 1 tablet by mouth daily.     Varenicline Tartrate, Starter, 0.5 MG X 11 & 1 MG X 42 TBPK Take one 0.5 mg tab by mouth once daily for 3 days, increase to one 0.5 mg tab twice daily for 4 days, increase to 1 mg tab twice daily.     No facility-administered medications prior to visit.    No Known Allergies  ROS Review of Systems  Constitutional: Negative.   HENT: Negative.  Negative for ear pain, postnasal drip, rhinorrhea, sinus pressure, sore throat, trouble swallowing and voice change.   Eyes: Negative.   Respiratory:  Positive for cough, shortness of breath and wheezing. Negative for apnea, choking, chest tightness and stridor.   Cardiovascular:  Positive for palpitations. Negative for chest pain and  leg swelling.  Gastrointestinal: Negative.  Negative for abdominal distention, abdominal pain, nausea and vomiting.  Genitourinary: Negative.   Musculoskeletal: Negative.  Negative for arthralgias and myalgias.  Skin: Negative.  Negative for rash.  Allergic/Immunologic: Negative.  Negative for environmental allergies and food allergies.  Neurological: Negative.  Negative for dizziness, syncope, weakness and headaches.  Hematological: Negative.  Negative for adenopathy. Does not bruise/bleed easily.  Psychiatric/Behavioral: Negative.  Negative for agitation, self-injury, sleep disturbance and suicidal ideas. The patient is not nervous/anxious.        Worried about health      Objective:    Physical Exam Vitals reviewed.  Constitutional:      Appearance: Normal appearance. He is well-developed. He is obese. He is not diaphoretic.  HENT:     Head: Normocephalic and atraumatic.     Nose: No nasal deformity, septal deviation, mucosal edema or rhinorrhea.     Right Sinus: No maxillary sinus tenderness or frontal sinus tenderness.     Left Sinus: No maxillary sinus tenderness or frontal sinus tenderness.     Mouth/Throat:     Mouth: Mucous membranes are moist.     Pharynx: Oropharynx is clear. No oropharyngeal exudate.  Eyes:     General: No scleral icterus.    Conjunctiva/sclera: Conjunctivae normal.     Pupils: Pupils are equal, round, and reactive to light.  Neck:     Thyroid: No thyromegaly.     Vascular: No carotid bruit or JVD.     Trachea: Trachea normal. No tracheal tenderness or tracheal deviation.  Cardiovascular:     Rate and Rhythm: Normal rate and regular rhythm.     Chest Wall: PMI is not displaced.     Pulses: Normal pulses. No decreased pulses.     Heart sounds: Normal heart sounds, S1 normal and S2 normal. Heart sounds not distant. No murmur heard.    No systolic murmur is present.     No diastolic murmur is present.     No friction rub. No gallop. No S3 or S4  sounds.  Pulmonary:     Effort: Pulmonary effort is normal. No tachypnea, accessory muscle usage or respiratory distress.     Breath sounds: No stridor. Wheezing and rhonchi present. No decreased breath sounds or rales.  Chest:     Chest wall: No tenderness.  Abdominal:     General: Bowel  sounds are normal. There is no distension.     Palpations: Abdomen is soft. Abdomen is not rigid.     Tenderness: There is no abdominal tenderness. There is no guarding or rebound.  Musculoskeletal:        General: Normal range of motion.     Cervical back: Normal range of motion and neck supple. No edema, erythema or rigidity. No muscular tenderness. Normal range of motion.  Lymphadenopathy:     Head:     Right side of head: No submental or submandibular adenopathy.     Left side of head: No submental or submandibular adenopathy.     Cervical: No cervical adenopathy.  Skin:    General: Skin is warm and dry.     Coloration: Skin is not pale.     Findings: No rash.     Nails: There is no clubbing.  Neurological:     Mental Status: He is alert and oriented to person, place, and time.     Sensory: No sensory deficit.  Psychiatric:        Mood and Affect: Mood normal.        Speech: Speech normal.        Behavior: Behavior normal.        Thought Content: Thought content normal.        Judgment: Judgment normal.     There were no vitals taken for this visit. Wt Readings from Last 3 Encounters:  08/08/22 202 lb (91.6 kg)  09/26/21 204 lb (92.5 kg)  09/12/21 210 lb (95.3 kg)     Health Maintenance Due  Topic Date Due   Lung Cancer Screening  06/27/2012   INFLUENZA VACCINE  07/18/2022   COVID-19 Vaccine (8 - 2023-24 season) 08/18/2022   Medicare Annual Wellness (AWV)  10/03/2022     There are no preventive care reminders to display for this patient.  Lab Results  Component Value Date   TSH 0.217 (L) 09/06/2021   Lab Results  Component Value Date   WBC CANCELED 06/06/2022   HGB  17.9 (H) 09/26/2021   HCT 50.1 09/26/2021   MCV 96.2 09/26/2021   PLT 173 09/26/2021   Lab Results  Component Value Date   NA 142 06/06/2022   K 4.2 06/06/2022   CO2 24 06/06/2022   GLUCOSE 84 06/06/2022   BUN 18 06/06/2022   CREATININE 1.59 (H) 06/06/2022   BILITOT 1.5 (H) 06/06/2022   ALKPHOS 50 09/06/2021   AST 22 06/06/2022   ALT 23 06/06/2022   PROT 6.4 06/06/2022   ALBUMIN 3.9 09/06/2021   CALCIUM 9.2 06/06/2022   ANIONGAP 10 09/26/2021   EGFR 49 (L) 06/06/2022   GFR 107.86 08/29/2011   Lab Results  Component Value Date   CHOL 162 03/10/2021   Lab Results  Component Value Date   HDL 49 03/10/2021   Lab Results  Component Value Date   LDLCALC 96 03/10/2021   Lab Results  Component Value Date   TRIG 76 03/10/2021   Lab Results  Component Value Date   CHOLHDL 3.3 03/10/2021   Lab Results  Component Value Date   HGBA1C 5.7 (H) 06/25/2020  Lung CT Cancer Screen: FINDINGS:   LUNG NODULES: No pulmonary nodules identified. Punctate calcified granuloma within the inferior aspect of the right upper lobe (series 2, image 245).   LUNGS: Mild bronchial wall thickening. No focal airspace consolidation. Scattered areas of linear scarring and/or subsegmental atelectasis.   PLEURA: No pleural thickening or effusion.  No pneumothorax.   HEART: Heart size normal. No pericardial effusion. Single-lead left chest wall cardiac rhythm maintenance device with lead terminating within the right ventricle.   CORONARY ARTERY CALCIFICATION: None.   MEDIASTINUM/HILUM/AXILLA: No adenopathy.   OTHER FINDINGS: None. Exam End: 08/26/21 09:56   Echo 04/2021 1. Left ventricular ejection fraction, by estimation, is 20 to 25%. The left ventricle has severely decreased function. The left ventricle has no regional wall motion abnormalities. The left ventricular internal cavity size was mildly to moderately dilated. Left ventricular diastolic parameters are consistent with Grade I  diastolic dysfunction (impaired relaxation). 2. Right ventricular systolic function is normal. The right ventricular size is normal. 3. The mitral valve is normal in structure. Mild mitral valve regurgitation. No evidence of mitral stenosis. 4. The aortic valve is normal in structure. Aortic valve regurgitation is not visualized. No aortic stenosis is present. 5. The inferior vena cava is normal in size with greater than 50% respiratory variability, suggesting right atrial pressure of 3 mmHg. Left Ventricle: Left ventricular ejection fraction, by estimation, is 20 to 25%. T  Assessment & Plan:   Problem List Items Addressed This Visit   None  No orders of the defined types were placed in this encounter.  38 minutes spent obtaining history interviewing patient performing exam complex decision making Follow-up: No follow-ups on file.    Asencion Noble, MD

## 2022-11-15 NOTE — Assessment & Plan Note (Signed)
Resume tamsulosin 

## 2022-11-15 NOTE — Assessment & Plan Note (Signed)
Continue Valtrex 

## 2022-11-15 NOTE — Assessment & Plan Note (Signed)
Acute COPD exacerbation give pulse prednisone azithromycin stay on Breo inhaler

## 2022-11-15 NOTE — Assessment & Plan Note (Addendum)
Per infectious disease

## 2022-11-15 NOTE — Assessment & Plan Note (Signed)
    Current smoking consumption amount: 1 pack every day  Dicsussion on advise to quit smoking and smoking impacts: Cardiovascular lung impacts  Patient's willingness to quit: Willing to quit  Methods to quit smoking discussed: Behavioral modification  Medication management of smoking session drugs discussed: on wellbutrin   Resources provided:  AVS   Setting quit date not established  Follow-up arranged 4 month   Time spent counseling the patient: 5 minutes

## 2022-11-15 NOTE — Patient Instructions (Addendum)
Labs today include metabolic panel blood count Know you have a liver function test and cholesterol study with infectious disease at your lab appointment upcoming  Start azithromycin take to the first day then 1 a day till gone for bronchitis and take prednisone as prescribed for bronchitis  Chest x-ray will be performed today  CT scan of the head will be scheduled  Please reduce your smoking use and start taking Chantix to reduce smoking  All medications refilled sent to your pharmacy  Return to Dr. Delford Field 6 weeks

## 2022-11-15 NOTE — Assessment & Plan Note (Signed)
Rule out stroke assess with CT head

## 2022-11-15 NOTE — Assessment & Plan Note (Addendum)
Not using substances

## 2022-11-15 NOTE — Assessment & Plan Note (Signed)
Blood pressure stable ? ?

## 2022-11-15 NOTE — Assessment & Plan Note (Signed)
Reassess renal function 

## 2022-11-15 NOTE — Progress Notes (Signed)
Annual Wellness Visit     Patient: Ryan Long, Male    DOB: 1961/08/11, 61 y.o.   MRN: [id number]  Subjective  Chief Complaint  Patient presents with   Medicare Wellness    Ryan Long is a 61 y.o. male who presents today for his Annual Wellness Visit. He reports consuming a low sodium diet.  He generally feels well. He reports sleeping well. He does have additional problems to discuss today.   09/12/21 Ryan Long presents for primary care follow-up visit.  Note patient has been seen over the summer at [facility] for cardiac  care and also seen locally for cardiology.  Patient has ejection fraction 25% has an AICD in place and had an episode September 12 where he became lightheaded and went to the emergency room.  He had previously had his AICD fired on August 25 but he did not seek evaluation at that time.  Below is documentation from the September ER visit.  He did not stay for completion of evaluation and left AGAINST MEDICAL ADVICE.  Recent hosp stay/ED visits and w/u at [facility] and here with cardiology   08/29/21 ED Ryan Long is a 61 y.o. male who presents with dizziness. He was at the heart failure clinic today when he had onset of light headedness, weakness and tunnel vision. He states: " I feel like I am going to fall out." He has been having these symptoms daily for the past several weeks. His ICD fired on Aug 25, but he did not seek evaluation at that time. He notes associated palpitations. He denies vertigo.  Patient appears irritated. He states that he does not want to be seen in trauma a room because "there is no TV or no food in here."  Patient also states "I have insurance."  I explained to the patient that we got him into the first available room because he has symptoms very concerning for potential arrhythmia.  I discussed with the patient that he could be having life-threatening arrhythmias and potentially needs some  adjustment to his defibrillator given his history of heart failure with an EF of less than 25%.  The patient understands fully that this could be a life-threatening issue leading to potential severe morbidity or mortality.  He refuses any further services and will leave [place].Date: 08/29/2021 Patient: Ryan Long Admitted: 08/29/2021  9:39 AM Attending Provider: Blanchie Dessert, MD   Ryan Long or his authorized caregiver has made the decision for the patient to leave the emergency department against the advice of [facility]   He or his authorized caregiver has been informed and understands the inherent risks, including death.  He or his authorized caregiver has decided to accept the responsibility for this decision. Ryan Long and all necessary parties have been advised that he may return for further evaluation or treatment. His condition at time of discharge was Stable.  Ryan Long had current vital signs as follows:   Blood pressure 109/69, pulse 95, temperature 98.4 F (36.9 C), temperature source Temporal, resp. rate 20, height _0  (1.753 m), weight 91.3 kg, SpO2 100 %.          Ryan Long or his authorized caregiver has not signed the Leaving Against Medical Advice form prior to leaving the department.  Below is a prior documentation from a visit at [facility] cardiology office in July Cardiology [place] 06/2021 at [facility] Cardiovascular Problem List:  1. Nonischemic cardiomyopathy  A. TTE (02/26/14):  The left ventricular size is normal.  There is mild concentric left ventricular hypertrophy.  There is mild global hypokinesis of the left ventricle.  LVEF 45-50%. The right ventricle is normal in size and function.  No significant stenosis or regurgitation seen  There is no pericardial effusion.  There is no comparison study available.  B. ESE (03/03/14):  The patient had chest pain in both stress and  recovery.  The patient achieved 91 % of maximum predicted heart rate.  The METS achieved was 7.  Exercise capacity was fair.  Normal left ventricular function and global wall motion with stress.  Negative exercise echocardiography for inducible ischemia at target heart  rate.  Negative stress ECG for inducible ischemia at target heart rate.   C. 2019 Implantation of primary prevention [place] SJM ICD by Dr. Allegra Lai at [place]. TTE [facility] 03/04/19):  LVEF 25%, mild aortic stenosis, moderate mitral regurgitation, normal RV size and function  SUBJECTIVE: Ryan Long is a 43 AAM with the above cardiovascular problem list who I last saw with my cardiology fellow, Dr. Santiago Bur on 07/08/19. The patient did not showed for cardiology clinic visit on 05/25/20. He now presents for "follow-up".  The patient is a poor historian and has had sporadic follow-up at [facility]. He has a history of heart failure with reduced ejection fraction secondary to nonischemic cardiomyopathy thought to be related to poorly controlled HIV. He has had placement of [employer] single-chamber ICD for primary prevention in January 2019. His clinical course is confounded by bipolar 1 disorder and polysubstance abuse. The patient has seen multiple providers in the area including [facility] and [facility] over the last several years. He most recently had been followed at [facility] by Dr. Blanche East at Dr. Allegra Lai, EP. The patient now tells me that he now wants to be seen exclusively at [facility]. The patient had been seen by Dr. Gelene Mink in the past for his ICD device - but the last monitoring of this was on 07/12/20. The patient now tells me that he wants to re-establish with Dr. Gelene Mink and is scheduled to see him in December.  The patient denied chest pain. He has SOB on moderate to strenuous exertion. He denied PND, orthopnea, or pedal edema. The patient has not experienced palpitations, dizziness, or  syncope. The patient tells me that he wants to "go back to work" but the cardiologists at [facility] wanted him to have an exercise stress echo to see what his functional status may be. The patient has been frustrated because two exercise stress echoes have been rescheduled at [facility] and that is why he is here today. He wants to have the stress test done here.  PMH: Past Medical History:  Diagnosis Date   Anxiety   CHF (congestive heart failure) ([place])   Depression   Human immunodeficiency virus (HIV) disease ([place])   Hypertension   ICD (implantable cardioverter-defibrillator) in place   Mood disorder ([place])   Polysubstance dependence ([place])  Social History Narrative  Born in [place], Mr. Cozzolino indicated that he moved around a lot as a child due to his father having been in the Army. The family settled in [place] after his father retired from [employer]. Recalling that his parents fought, he said they separated when he was about age 88. After the separation, he said, he had to fight off sexual advances from his mother. His parents divorced in about 18, and  his father died two years later. His mother reportedly lives in [place]. The older of two children, he noted having a brother who works as a Dealer in [place] whom he has not seen in over five years.   From 1981-82 Mr. Trawick studied at [employer] in [place]. In August 2014 he enrolled in a business administration program at [employer] in [place] but he dropped out in December in the wake of being denied disability, losing his housing support, and being hospitalized for depression. He appealed the disability decision and was awarded benefits in September 2015. Since receiving his benefits he has moved among [place], [place], and [place] living in [place], motel rooms, or shelters while trying to find a stable residence.   Never married, Mr. Mattie said he dates women and that his longest relationship lasted  about one year. Not currently in a relationship, he indicated having no children. Describing himself as Mina Marble, he indicated that he has attended church some in the past year or so. Saying that socially he mostly stays to himself, Mr. Bessey said he likes watching sports, playing ping-pong or billiards, and going to movies.   Immunizations Immunization History  Administered Date(s) Administered   Flu Vaccine 12moand up (FLUZONE/FLULAVAL/FLUARIX VIAL OR SYRINGE) INACTIVATED QUADRAVALENT 08/15/2019   Influenza TIV (IM) 09/29/2010   Influenza Whole 09/20/2009, 08/24/2011   Janssen SARS-CoV-2 Vaccine 03/05/2020   Tdap (ADACEL, BBolivar 11/19/2007, 02/10/2014   hepatitis B (ENGERIX-B, RECOMBIVAX HB) 08/24/2011, 09/25/2011   influenza (Historical) 11/17/2008, 09/29/2010, 09/25/2011, 09/16/2012, 10/16/2013, 12/22/2013, 08/16/2015, 10/10/2015, 11/17/2016, 09/12/2017, 10/18/2017, 09/04/2018, 10/29/2019   pneumococcal conjugate 13-valent (PREVNAR) 02/10/2014   pneumococcal polysaccharide 23-valent (PNEUMOVAX 23) 05/23/2004, 10/01/2009, 01/17/2016   tuberculin PPD Test (APLISOL, TUBERSOL) 11/19/2007, 11/01/2010   zoster vaccine ([facility] (Historical) 06/13/2018, 08/13/2018   ?  I personally reviewed the patient's general problem list: Patient Active Problem List  Diagnosis   Human immunodeficiency virus (HIV) disease ([place]   Major depression, recurrent ([place]   Anxiety   HFrEF (heart failure with reduced ejection fraction) (HCC)   Non-ischemic cardiomyopathy (HCC)   Cannabis abuse, episodic   Cocaine abuse, episodic (HCC)   Primary insomnia   HSV-2 infection   Achilles tendinitis, left leg   Encounter to establish care   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[place]   Tobacco use disorder   Hyperlipidemia   History of attempted suicide   History of alcoholism ([place]   Cocaine use disorder, severe, dependence ([place]   BPH (benign prostatic hyperplasia)   Atrial fibrillation  ([place]   Alcohol use disorder, severe, dependence ([place]   AICD (automatic cardioverter/defibrillator) present   PTSD (post-traumatic stress disorder)   I personally reviewed his Care Everywhere chart. Of note: Coronary CTA done at [facility] (03/12/11): 1. Normal appearing coronary arteries.  2. Severe left ventricular dysfunction with moderate dilated cardiomyopathy.   ASSESSMENT: The patient has been carried as a non-ischemic cardiomyopathy of unclear etiology. He has known LVEF of 20-25% and is s/p single chamber ICD. He seems to be well compensated on his current medical regimen including Entresto.   The patient is insistent that [facility] His DOE could be an anginal equivalent. Also will get a better idea of his functional status. Will order an exercise stress echo. If negative for coronary ischemia - we can reassure the patient and referring physician and look into other etiologies for the symptoms. If positive for coronary ischemia - then the patient will need  a definitive cardiac cath to guide therapy (meds vs PCI vs CABG) if obstructive coronary artery disease is present.   The patient has been hard to follow as he has been sporadic in keeping his return visits. He has a myriad of medical issues confounding his cardiovascular state.   Discussed with patient - he appears to understand and agree to the plan as outlined  PLAN: 1. Continue current medications: Current Outpatient Medications  Medication Sig Dispense Refill   aspirin 81 MG EC tablet *ANTIPLATELET* Take by mouth daily.   bictegravir-emtricitabine-tenofovir alafenamide (BIKTARVY) 50-200-25 mg per tablet Take 1 tablet by mouth daily. 90 tablet 3   eplerenone (INSPRA) 25 MG tablet TAKE 1/2 TABLET BY MOUTH EVERY DAY 45 tablet 3   fluticasone furoate-vilanteroL (BREO ELLIPTA) 100-25 mcg/dose inhaler Inhale 1 puff into the lungs daily.   hydrOXYzine pamoate (VISTARIL) 25 mg  capsule Take 1 capsule (25 mg total) by mouth 2 (two) times daily as needed for Anxiety. 30 capsule 3   melatonin 10 mg capsule Take 10 mg by mouth at bedtime.   metoPROLOL succinate (TOPROL-XL) 25 MG 24 hr tablet Take 1 tablet (25 mg total) by mouth daily. 90 tablet 3   multivit-min-FA-lycopen-lutein (MEN 50 PLUS MULTIVITAMIN) 300-600-300 mcg Tab Take by mouth.   nicotine (NICODERM CQ) 21 mg/24 hr Place 1 patch onto the skin daily. 28 patch 1   sacubitriL-valsartan (ENTRESTO) 24-26 mg per tablet Take 1 tablet by mouth 2 times daily.   sildenafiL (VIAGRA) 50 MG tablet TK 1 T PO QD 1 HOUR B SEXUAL ACTIVITY PRN   tamsulosin (FLOMAX) 0.4 mg Cap capsule TAKE 2 CAPSULES BY MOUTH EVERY DAY FOR BPH 180 capsule 3   traZODone (DESYREL) 100 MG tablet Take 1 tablet (100 mg total) by mouth nightly. 30 tablet 3   valACYclovir (VALTREX) 500 MG tablet Take 1 tablet (500 mg total) by mouth daily. 90 tablet 3   rivaroxaban (XARELTO) 20 mg tablet *ANTICOAGULANT* Take 1 tablet (20 mg total) by mouth daily with dinner. 30 tablet 11   No current facility-administered medications for this visit.   2. Follow a low carbohydrate, high protein, low glycemic diet.  3. Avoid excessive salt intake. 4. Try to get regular exercise at least 30 minutes (no more, no less) at least every other day.  5. Consistency of exercise is more important than duration or intensity. 6. Call 911 and go to the nearest emergency room for any episode of severe chest pain lasting longer than 15 minutes. 7. Give Cardiology a phone call [phone]) if you develop increased exertional chest pain or shortness of breath. 8. Have ordered an exercise stress echo to assess coronary flow and heart muscle function. Will be in touch with you about the results.  9. Regular follow-up visits with your primary care physician. 10. Keep appointment with Dr. Gelene Mink in December 11. Cardiology follow-up visit in 2 months.  The patient is subsequently  seen cardiology again just a week ago.  Patient had his ICD interrogated and this showed he had episodes of ventricular fibrillation and nonsustained ventricular tachycardia which caused his device to firing August 25.  He has had no further device firing since.  Note patient has not been using cocaine in the past year.  He has severe PTSD.  He is living in a shared housing situation with 3 other individuals all of whom are on crack cocaine.  Patient does note increased wheezing and productive cough of thick green mucus over the last  several days.  Patient is compliant with all his medications.  His dose of Entresto was reduced.  Cardiology would like a repeat metabolic panel we will obtain this today.  Patient is maintained on aspirin, Biktarvy, Inspra, Breo, metoprolol, potassium, tamsulosin, trazodone, Valtrex  Patient just received his COVID booster which is the [employer] booster and had a significant reaction to this.  He declines to receive the flu vaccine at this visit.  Patient is still smoking about a half a pack a day of cigarettes and does know the importance of reducing further.  Patient has a stress test coming up later in the week per cardiology he is encouraged to keep this appointment.  Due to his PTSD the patient does drink about 2  24 ounce bottles of beer about every other day  11/15/22 Patient returns to the clinic after a 1 year absence He had been seeing a primary care doctor in [facility] below is the documentation in September of this year.  The patient does follow with primary infectious disease physician for HIV and his numbers are good  09/11/22: Ryan Long is a 61 y.o. male who presents to the office today for reestablishing chronic care and complaints of a rash. States he's had a pruritic rash in the bilateral axillary region for the last 3 to 4 months. States he has tried over-the-counter hydrocortisone and athlete's foot medications without any  relief. Denies any other associated symptoms. Medical history significant for chronic HIV infection, heart failure reduced ejection fraction with an ICD/pacemaker, CKD stage II, chronic bronchitis, current smoker with greater than 20-pack-year history, polycythemia, chronic neutropenia, vitamin D deficiency, and hyperlipidemia. Patient follows with ID for HIV management, currently on Biktarvy and prophylactic Valtrex. He has also been seen by cardiology most recently for complaints of syncopal episodes. States that he would sometimes experience lightheadedness for unknown causes. A Holter monitor was placed however he self removed due to subjective cost. He denies any palpitations, flutters, or strokelike symptoms. Last TTE was completed on 12/13/2021 showing an EF of 25 to 30%, with severe reduced systolic function, global hypokinesis of left ventricle, mild dilation of RV, LA, and RA, mild MR. Current relevant medications include metoprolol XL 50 mg, furosemide 20 mg, eplerenone 25 mg, and Entresto 24-26 mg. He has been seen by hematology in the past for polycythemia and chronic neutropenia, however does not regularly follow with anyone. He denies any headaches, chest pains, palpitations, dyspnea, pedal edema, orthopnea, or any recurrent infections. Born in [place], Mr. Couper indicated that he moved around a lot as a child due to his father having been in the Army. The family settled in [place] after his father retired from [employer]. Recalling that his parents fought, he said they separated when he was about age 20. After the separation, he said, he had to fight off sexual advances from his mother. His parents divorced in about 63, and his father died two years later. His mother reportedly lives in [place]. The older of two children, he noted having a brother who works as a Dealer in [place] whom he has not seen in over five years.  From 1981-82 Mr. Syme studied at [employer] in  [place]. In August 2014 he enrolled in a business administration program at [employer] in [place] but he dropped out in December in the wake of being denied disability, losing his housing support, and being hospitalized for depression. He appealed the disability decision and was awarded  benefits in September 2015. Since receiving his benefits he has moved among [place], [place], and [place] living in [place], motel rooms, or shelters while trying to find a stable residence.  Never married, Mr. Donigan said he dates women and that his longest relationship lasted about one year. Not currently in a relationship, he indicated having no children. Describing himself as Mina Marble, he indicated that he has attended church some in the past year or so. Saying that socially he mostly stays to himself, Mr. Hiltner said he likes watching sports, playing ping-pong or billiards, and going to movies.   1. HFrEF (heart failure with reduced ejection fraction) ([place]) 2. Simple chronic bronchitis (HCC) 3. Stage 2 chronic kidney disease 4. Polycythemia 5. Cigarette smoker 6. Rash 7. Mixed hyperlipidemia 8. Vitamin D deficiency 9. Erectile dysfunction due to diseases classified elsewhere  Plan Comprehensive pertinent labs including lipids and vitamin D ordered to be checked prior to next office visit. Most recent CBC revealed chronic polycythemia and chronic neutropenia with a ANC of 800. Referral was sent back to hematology for this reason. Given 7-day supply of fluconazole for suspected Candida of bilateral axilla. If symptoms do not resolve will refer to topical triamcinolone or mometasone due to CHF. After discussion patient was agreeable for referral lung cancer annual screening. Patient was advised to discuss with his cardiologist if he should be taking furosemide and eplerenone concomitantly. Patient endorses regular use of Breo Ellipta inhaler for COPD maintenance without any recurrences of oral thrush.  Patient requests refill on sildenafil refill however his insurance does not cover the 50 mg or 100 mg dosages. Patient given 90-day supplies on other pertinent medications including rosuvastatin, Breo Ellipta, and Toprol-XL.  Medications Prescribed Orders Placed This Encounter Medications  fluconazole (DIFLUCAN) 100 MG tablet Sig: Take 1 tablet (100 mg total) by mouth daily for 7 days. Dispense: 7 tablet Refill: 0  rosuvastatin (CRESTOR) 5 MG tablet Sig: Take 1 tablet (5 mg total) by mouth daily. Dispense: 90 tablet Refill: 0  BREO ELLIPTA 100-25 mcg/dose inhaler Sig: Inhale 1 puff into the lungs daily. Dispense: 60 each Refill: 2  metoPROLOL succinate (TOPROL-XL) 50 MG 24 hr tablet Sig: Take 1 tablet (50 mg total) by mouth daily. Dispense: 90 tablet Refill: 0  sildenafiL, pulm.hypertension, (REVATIO) 20 mg tablet Sig: Take 1 tablet (20 mg total) by mouth 3 times daily for 10 days. Dispense: 30 tablet Refill: 0  Other Orders Orders Placed This Encounter Procedures  CT LUNG SCREEN WO CONTRAST ANNUAL  Lipid Profile  25(OH) Vitamin D Total  Comprehensive metabolic panel  CBC And Differential  Ambulatory referral to Hematology / Oncology   The patient did get a lung CT scan screening study that was normal except for presence of aortic atherosclerosis.  And mild emphysema.  The patient's been coughing for 2 weeks productive of thick yellow mucus and wheezing he is on the [facility] inhaler.  He would benefit from follow-up chest x-ray.  He is smoking 12 to 13 cigarettes daily.  He also complains of numbness in the left arm and face that started 2 weeks ago and progressing.  He has never had carotid disease and a CT of the head done in 2016 was normal.  He needs refills on all his medications screening labs.  He declined a flu vaccine    Vision:Within last year Dental;  upcoming  Patient Active Problem List   Diagnosis Date Noted   Arm numbness 11/15/2022   Aortic  atherosclerosis ([place]) 11/15/2022  Chronic kidney disease 08/08/2022   Healthcare maintenance 08/08/2022   Cigarette smoker 01/26/2022   ETD (Eustachian tube dysfunction), right 01/26/2022   Polycythemia 12/08/2021   Vitamin D deficiency 12/08/2021   Hip pain 01/06/2021   VT (ventricular tachycardia) ([place]) 08/25/2020   HIV positive ([place]) 07/12/2020   Atrial fibrillation ([place]) 06/10/2020   BPH (benign prostatic hyperplasia) 01/28/2020   HFrEF (heart failure with reduced ejection fraction) ([place]) 01/28/2020   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[place]) 08/29/2019   AICD (automatic cardioverter/defibrillator) present 08/15/2018   Abnormal EKG 06/28/2018   Adjustment disorder with mixed disturbance of emotions and conduct    NICM (nonischemic cardiomyopathy) ([place]) 01/08/2018   Anxiety 12/20/2017   Bipolar 1 disorder ([place]) 12/26/3233   Chronic systolic heart failure ([place]) 05/13/2017   HSV-2 infection 07/17/2016   History of attempted suicide 04/14/2016   History of alcoholism ([place]) 04/14/2016   History of substance abuse ([place]) 04/14/2016   Constipation 01/17/2016   COPD exacerbation ([place]) 10/04/2015   Abnormal thyroid stimulating hormone (TSH) level 10/04/2015   MDD (major depressive disorder), recurrent episode, severe ([place]) 09/23/2015   Hypertension 09/10/2015   PTSD (post-traumatic stress disorder) 08/07/2015   Primary insomnia 04/02/2015   Herpesviral infection of penis 04/02/2015   Generalized anxiety disorder 05/29/2014   Tobacco use disorder 02/17/2013   Heart failure ([place]) 09/19/2011   Other primary cardiomyopathies 08/24/2011   Human immunodeficiency virus (HIV) disease ([place]) 02/01/2009   Recurrent HSV (herpes simplex virus) 02/01/2009   Hyperlipidemia 02/01/2009   Allergic rhinitis 02/01/2009   Past Medical History:  Diagnosis Date   Active smoker    AICD (automatic cardioverter/defibrillator) present    Alcohol abuse    Anxiety    AR (allergic rhinitis)    Bipolar 1 disorder  (HCC)    Cervical lymphadenitis 04/20/2021   CHF (congestive heart failure) (HCC)    Chronic bronchitis ([place])    Chronic systolic heart failure ([place])    Controlled substance agreement signed 10/22/2018   COPD (chronic obstructive pulmonary disease) ([place])    Cough 12/31/2018   Crack cocaine use    Depression    Genital herpes    HIV (human immunodeficiency virus infection) ([place]) dx'd 08/1993   HLD (hyperlipidemia)    Hypertension    NICM (nonischemic cardiomyopathy) ([address])    Echocardiogram 06/28/11: EF 30-35%, mild MR, mild LAE;  No CAD by coronary CT angiogram 3/12 at [facility]   NSVT (nonsustained ventricular tachycardia) ([place])    PTSD (post-traumatic stress disorder)    Past Surgical History:  Procedure Laterality Date   CARDIAC DEFIBRILLATOR PLACEMENT  01/08/2018   ICD IMPLANT N/A 01/08/2018   Procedure: ICD IMPLANT;  Surgeon: Constance Haw, MD;  Location: [facility];  Service: Cardiovascular;  Laterality: N/A;   Social History   Tobacco Use   Smoking status: Every Day    Packs/day: 0.75    Years: 38.00    Total pack years: 28.50    Types: Cigarettes   Smokeless tobacco: Never   Tobacco comments:    hoping welbutrin will help  Vaping Use   Vaping Use: Never used  Substance Use Topics   Alcohol use: Not Currently    Alcohol/week: 6.0 standard drinks of alcohol    Types: 6 Cans of beer per week    Comment: 01/08/2018 "stopped 06/2017"   Drug use: Not Currently    Types: Cocaine    Comment: 01/08/2018 "stopped 06/2017"   Social History   Socioeconomic History   Marital status:  Single    Spouse name: Not on file   Number of children: Not on file   Years of education: Not on file   Highest education level: Not on file  Occupational History   Not on file  Tobacco Use   Smoking status: Every Day    Packs/day: 0.75    Years: 38.00    Total pack years: 28.50    Types: Cigarettes   Smokeless tobacco: Never   Tobacco comments:    hoping  welbutrin will help  Vaping Use   Vaping Use: Never used  Substance and Sexual Activity   Alcohol use: Not Currently    Alcohol/week: 6.0 standard drinks of alcohol    Types: 6 Cans of beer per week    Comment: 01/08/2018 "stopped 06/2017"   Drug use: Not Currently    Types: Cocaine    Comment: 01/08/2018 "stopped 06/2017"   Sexual activity: Yes    Birth control/protection: Condom    Comment: pt. declined condoms  Other Topics Concern   Not on file  Social History Narrative   Not on file   Social Determinants of Health   Financial Resource Strain: Low Risk  (11/15/2022)   Overall Financial Resource Strain (CARDIA)    Difficulty of Paying Living Expenses: Not hard at all  Food Insecurity: No Food Insecurity (11/15/2022)   Hunger Vital Sign    Worried About Running Out of Food in the Last Year: Never true    Ran Out of Food in the Last Year: Never true  Transportation Needs: No Transportation Needs (11/15/2022)   PRAPARE - Hydrologist (Medical): No    Lack of Transportation (Non-Medical): No  Physical Activity: Insufficiently Active (11/15/2022)   Exercise Vital Sign    Days of Exercise per Week: 2 days    Minutes of Exercise per Session: 10 min  Stress: Stress Concern Present (11/15/2022)   [place]    Feeling of Stress : To some extent  Social Connections: Unknown (11/15/2022)   Social Connection and Isolation Panel [NHANES]    Frequency of Communication with Friends and Family: Once a week    Frequency of Social Gatherings with Friends and Family: Once a week    Attends Religious Services: Not on Advertising copywriter or Organizations: Yes    Attends Music therapist: More than 4 times per year    Marital Status: Never married  Human resources officer Violence: Not on file   Family Status  Relation Name Status   Mother  Alive   Father  Deceased at age 29    Brother  (Not Specified)   Brother  (Not Specified)   Family History  Problem Relation Age of Onset   Glaucoma Mother    Mental illness Mother    Vision loss Mother    Hypertension Father    CAD Father    Mental illness Father    Alcohol abuse Father    Drug abuse Father    Heart attack Father 71   Early death Father    Alcohol abuse Brother    Drug abuse Brother    Drug abuse Brother    No Known Allergies    Medications: Outpatient Medications Prior to Visit  Medication Sig   aspirin EC 81 MG tablet Take 81 mg by mouth daily. Swallow whole.   bictegravir-emtricitabine-tenofovir AF (BIKTARVY) 50-200-25 MG TABS tablet Take 1 tablet by  mouth daily.   eplerenone (INSPRA) 25 MG tablet TAKE 0.5 TABLET BY MOUTH EVERY DAY   Melatonin 10 MG CAPS Take 10 mg by mouth at bedtime as needed (sleep).   Multiple Vitamins-Minerals (MENS MULTIPLUS PO) Take 1 tablet by mouth daily.   nicotine (NICODERM CQ - DOSED IN MG/24 HOURS) 21 mg/24hr patch    sacubitril-valsartan (ENTRESTO) 24-26 MG Take 1 tablet by mouth 2 (two) times daily.   Varenicline Tartrate, Starter, 0.5 MG X 11 & 1 MG X 42 TBPK Take one 0.5 mg tab by mouth once daily for 3 days, increase to one 0.5 mg tab twice daily for 4 days, increase to 1 mg tab twice daily.   [DISCONTINUED] fluticasone furoate-vilanterol (BREO ELLIPTA) 100-25 MCG/INH AEPB Inhale 1 puff into the lungs daily.   [DISCONTINUED] furosemide (LASIX) 20 MG tablet Take 1 tablet by mouth daily.   [DISCONTINUED] hydrOXYzine (ATARAX) 25 MG tablet Take 25-50 mg by mouth 2 (two) times daily as needed.   [DISCONTINUED] hydrOXYzine (VISTARIL) 25 MG capsule Take 25 mg by mouth 2 (two) times daily.   [DISCONTINUED] metoprolol succinate (TOPROL-XL) 50 MG 24 hr tablet Take 1 tablet (50 mg total) by mouth daily. For high blood pressure   [DISCONTINUED] potassium chloride SA (KLOR-CON M) 20 MEQ tablet TAKE 1 TABLET BY MOUTH EVERY DAY   [DISCONTINUED] rosuvastatin (CRESTOR) 5 MG  tablet Take 1 tablet by mouth daily.   [DISCONTINUED] sildenafil (VIAGRA) 100 MG tablet Take 100 mg by mouth daily.   [DISCONTINUED] sildenafil (VIAGRA) 50 MG tablet Take 50 mg by mouth as needed for erectile dysfunction.   [DISCONTINUED] tamsulosin (FLOMAX) 0.4 MG CAPS capsule TAKE 2 CAPSULES(0.8 MG) BY MOUTH DAILY AFTER SUPPER   [DISCONTINUED] traZODone (DESYREL) 50 MG tablet Take 1 tablet (50 mg total) by mouth at bedtime as needed. for sleep   [DISCONTINUED] valACYclovir (VALTREX) 500 MG tablet Take 1 tablet by mouth daily.   No facility-administered medications prior to visit.    No Known Allergies  Patient Care Team: Elsie Stain, MD as PCP - General (Pulmonary Disease) Constance Haw, MD as PCP - Electrophysiology (Cardiology) Constance Haw, MD as Consulting Physician (Cardiology)  Review of Systems  Constitutional:  Negative for chills, diaphoresis, fever, malaise/fatigue and weight loss.  HENT:  Negative for congestion, hearing loss, nosebleeds, sore throat and tinnitus.   Eyes:  Negative for blurred vision, photophobia and redness.  Respiratory:  Positive for cough, sputum production, shortness of breath and wheezing. Negative for hemoptysis and stridor.   Cardiovascular:  Negative for chest pain, palpitations, orthopnea, claudication, leg swelling and PND.  Gastrointestinal:  Negative for abdominal pain, blood in stool, constipation, diarrhea, heartburn, nausea and vomiting.  Genitourinary:  Negative for dysuria, flank pain, frequency, hematuria and urgency.  Musculoskeletal:  Negative for back pain, falls, joint pain, myalgias and neck pain.  Skin:  Negative for itching and rash.  Neurological:  Negative for dizziness, tingling, tremors, sensory change, speech change, focal weakness, seizures, loss of consciousness, weakness and headaches.  Endo/Heme/Allergies:  Negative for environmental allergies and polydipsia. Does not bruise/bleed easily.   Psychiatric/Behavioral:  Negative for depression, memory loss, substance abuse and suicidal ideas. The patient is not nervous/anxious and does not have insomnia.         Objective  BP 97/69   Pulse 96   Wt 202 lb (91.6 kg)   SpO2 97%   BMI 29.83 kg/m  BP Readings from Last 3 Encounters:  11/15/22 97/69  08/08/22 94/66  09/26/21 114/85   Wt Readings from Last 3 Encounters:  11/15/22 202 lb (91.6 kg)  08/08/22 202 lb (91.6 kg)  09/26/21 204 lb (92.5 kg)      Physical Exam Vitals reviewed.  Constitutional:      Appearance: Normal appearance. He is well-developed. He is not diaphoretic.  HENT:     Head: Normocephalic and atraumatic.     Nose: Congestion and rhinorrhea present. No nasal deformity, septal deviation or mucosal edema.     Right Sinus: No maxillary sinus tenderness or frontal sinus tenderness.     Left Sinus: No maxillary sinus tenderness or frontal sinus tenderness.     Mouth/Throat:     Mouth: Mucous membranes are moist.     Pharynx: Oropharynx is clear. No oropharyngeal exudate.  Eyes:     General: No scleral icterus.    Conjunctiva/sclera: Conjunctivae normal.     Pupils: Pupils are equal, round, and reactive to light.  Neck:     Thyroid: No thyromegaly.     Vascular: No carotid bruit or JVD.     Trachea: Trachea normal. No tracheal tenderness or tracheal deviation.  Cardiovascular:     Rate and Rhythm: Normal rate and regular rhythm.     Chest Wall: PMI is not displaced.     Pulses: Normal pulses. No decreased pulses.     Heart sounds: Normal heart sounds, S1 normal and S2 normal. Heart sounds not distant. No murmur heard.    No systolic murmur is present.     No diastolic murmur is present.     No friction rub. No gallop. No S3 or S4 sounds.  Pulmonary:     Effort: Pulmonary effort is normal. No tachypnea, accessory muscle usage or respiratory distress.     Breath sounds: No stridor. Wheezing, rhonchi and rales present. No decreased breath  sounds.  Chest:     Chest wall: No tenderness.  Abdominal:     General: Bowel sounds are normal. There is no distension.     Palpations: Abdomen is soft. Abdomen is not rigid.     Tenderness: There is no abdominal tenderness. There is no guarding or rebound.  Musculoskeletal:        General: Normal range of motion.     Cervical back: Normal range of motion and neck supple. No edema, erythema or rigidity. No muscular tenderness. Normal range of motion.  Lymphadenopathy:     Head:     Right side of head: No submental or submandibular adenopathy.     Left side of head: No submental or submandibular adenopathy.     Cervical: No cervical adenopathy.  Skin:    General: Skin is warm and dry.     Coloration: Skin is not pale.     Findings: No rash.     Nails: There is no clubbing.  Neurological:     Mental Status: He is alert and oriented to person, place, and time.     Sensory: No sensory deficit.  Psychiatric:        Mood and Affect: Mood normal.        Speech: Speech normal.        Behavior: Behavior normal.        Thought Content: Thought content normal.        Judgment: Judgment normal.       Most recent functional status assessment:    11/15/2022   12:34 PM  In your present state of health, do you have any difficulty  performing the following activities:  Hearing? 0  Vision? 0  Difficulty concentrating or making decisions? 0  Walking or climbing stairs? 0  Dressing or bathing? 0  Doing errands, shopping? 0  Preparing Food and eating ? N  Using the Toilet? N  In the past six months, have you accidently leaked urine? N  Do you have problems with loss of bowel control? N  Managing your Medications? N  Managing your Finances? N  Housekeeping or managing your Housekeeping? N   Most recent fall risk assessment:    11/15/2022   11:16 AM  Fall Risk   Falls in the past year? 0  Number falls in past yr: 0  Injury with Fall? 0  Risk for fall due to : No Fall Risks     Most recent depression screenings:    08/08/2022    8:41 AM 10/03/2021    8:00 AM  PHQ 2/9 Scores  PHQ - 2 Score 0 2  PHQ- 9 Score  6   Most recent cognitive screening:    11/15/2022   12:38 PM  6CIT Screen  What Year? 0 points  What month? 0 points  What time? 0 points  Count back from 20 0 points  Months in reverse 0 points   Most recent Audit-C alcohol use screening    11/15/2022   12:34 PM  Alcohol Use Disorder Test (AUDIT)  1. How often do you have a drink containing alcohol? 0  2. How many drinks containing alcohol do you have on a typical day when you are drinking? 0  3. How often do you have six or more drinks on one occasion? 0  AUDIT-C Score 0   A score of 3 or more in women, and 4 or more in men indicates increased risk for alcohol abuse, EXCEPT if all of the points are from question 1   Vision/Hearing Screen: No results found.  Last CBC Lab Results  Component Value Date   WBC CANCELED 06/06/2022   HGB 17.9 (H) 09/26/2021   HCT 50.1 09/26/2021   MCV 96.2 09/26/2021   MCH 34.4 (H) 09/26/2021   RDW 14.0 09/26/2021   PLT 173 97/98/9211   Last metabolic panel Lab Results  Component Value Date   GLUCOSE 84 06/06/2022   NA 142 06/06/2022   K 4.2 06/06/2022   CL 107 06/06/2022   CO2 24 06/06/2022   BUN 18 06/06/2022   CREATININE 1.59 (H) 06/06/2022   EGFR 49 (L) 06/06/2022   CALCIUM 9.2 06/06/2022   PROT 6.4 06/06/2022   ALBUMIN 3.9 09/06/2021   LABGLOB 1.5 09/06/2021   AGRATIO 2.6 (H) 09/06/2021   BILITOT 1.5 (H) 06/06/2022   ALKPHOS 50 09/06/2021   AST 22 06/06/2022   ALT 23 06/06/2022   ANIONGAP 10 09/26/2021   Last lipids Lab Results  Component Value Date   CHOL 162 03/10/2021   HDL 49 03/10/2021   LDLCALC 96 03/10/2021   TRIG 76 03/10/2021   CHOLHDL 3.3 03/10/2021   Last thyroid functions Lab Results  Component Value Date   TSH 0.217 (L) 09/06/2021   T3TOTAL 134 08/09/2015   Last vitamin D No results found for: "25OHVITD2",  "25OHVITD3", "VD25OH" Last vitamin B12 and Folate No results found for: "VITAMINB12", "FOLATE"    No results found for any visits on 11/15/22.    Assessment & Plan   Annual wellness visit done today including the all of the following: Reviewed patient's Family Medical History Reviewed and updated list  of patient's medical providers Assessment of cognitive impairment was done Assessed patient's functional ability Established a written schedule for health screening [place] Completed and Reviewed  Exercise Activities and Dietary recommendations  Goals   None     Immunization History  Administered Date(s) Administered   Hepatitis B 08/24/2011, 09/25/2011   Hepatitis B, PED/ADOLESCENT 08/24/2011, 09/25/2011   Influenza Split 09/25/2011, 09/04/2018, 10/29/2019   Influenza Whole 09/20/2009, 08/24/2011   Influenza,inj,Quad PF,6+ Mos 12/22/2013, 08/16/2015, 11/17/2016, 09/12/2017   Influenza-Unspecified 11/17/2008, 09/29/2010, 09/25/2011, 09/16/2012, 10/16/2013, 12/22/2013, 08/16/2015, 11/17/2016, 09/12/2017, 10/18/2017, 09/04/2018, 08/15/2019, 10/29/2019   Janssen (J&J) SARS-COV-2 Vaccination 03/05/2020   Moderna SARS-COV2 Booster Vaccination 02/02/2021   Moderna Sars-Covid-2 Vaccination 02/02/2021, 03/16/2021   PFIZER Comirnaty(Gray Top)Covid-19 Tri-Sucrose Vaccine 09/29/2020, 10/10/2020, 09/04/2021   PFIZER(Purple Top)SARS-COV-2 Vaccination 09/29/2020, 09/04/2021   PNEUMOCOCCAL CONJUGATE-20 05/23/2021   PPD Test 11/19/2007, 11/01/2010   Pfizer Covid-19 Vaccine Bivalent Booster 86yr & up 09/02/2021   Pneumococcal Conjugate-13 02/10/2014   Pneumococcal Polysaccharide-23 05/23/2004, 10/01/2009, 01/17/2016   Tdap 04/20/1994, 02/10/2014, 06/07/2021   Zoster Recombinat (Shingrix) 06/13/2018, 08/13/2018   Zoster, Live 08/13/2018   Zoster, Unspecified 06/13/2018, 08/13/2018    Health Maintenance  Topic Date Due   COVID-19 Vaccine (8 - 2023-24 season)  08/18/2022   Medicare Annual Wellness (AWV)  10/03/2022   INFLUENZA VACCINE  03/18/2023 (Originally 07/18/2022)   Lung Cancer Screening  10/12/2023   Fecal DNA (Cologuard)  03/30/2024   Hepatitis C Screening  Completed   HIV Screening  Completed   Zoster Vaccines- Shingrix  Completed   HPV VACCINES  Aged Out     Discussed health benefits of physical activity, and encouraged him to engage in regular exercise appropriate for his age and condition.    Problem List Items Addressed This Visit       Cardiovascular and Mediastinum   Chronic systolic heart failure (HCC)    History of chronic systolic heart failure we will renew all medications he appears to be clinically volume status normal blood pressure is good      Relevant Medications   furosemide (LASIX) 20 MG tablet   metoprolol succinate (TOPROL-XL) 50 MG 24 hr tablet   rosuvastatin (CRESTOR) 5 MG tablet   sildenafil (VIAGRA) 50 MG tablet   Other Relevant Orders   CBC with Differential/Platelet   BMP8+eGFR   Hypertension    Blood pressure stable      Relevant Medications   furosemide (LASIX) 20 MG tablet   metoprolol succinate (TOPROL-XL) 50 MG 24 hr tablet   rosuvastatin (CRESTOR) 5 MG tablet   sildenafil (VIAGRA) 50 MG tablet   Aortic atherosclerosis (HCC)    Seen on screening CT of the lung continue lipid therapy check lipids      Relevant Medications   furosemide (LASIX) 20 MG tablet   metoprolol succinate (TOPROL-XL) 50 MG 24 hr tablet   rosuvastatin (CRESTOR) 5 MG tablet   sildenafil (VIAGRA) 50 MG tablet     Respiratory   COPD exacerbation (HCC) - Primary    Acute COPD exacerbation give pulse prednisone azithromycin stay on Breo inhaler      Relevant Medications   fluticasone furoate-vilanterol (BREO ELLIPTA) 100-25 MCG/ACT AEPB   predniSONE (DELTASONE) 10 MG tablet   azithromycin (ZITHROMAX) 250 MG tablet   Other Relevant Orders   DG Chest 2 View     Genitourinary   Herpesviral infection of penis     Continue Valtrex      Relevant Medications   valACYclovir (VALTREX) 500 MG  tablet   azithromycin (ZITHROMAX) 250 MG tablet   BPH (benign prostatic hyperplasia)    Resume tamsulosin      Relevant Medications   tamsulosin (FLOMAX) 0.4 MG CAPS capsule   Chronic kidney disease    Reassess renal function        Other   Human immunodeficiency virus (HIV) disease ([place])    Per infectious disease      Relevant Medications   valACYclovir (VALTREX) 500 MG tablet   azithromycin (ZITHROMAX) 250 MG tablet   Tobacco use disorder       Current smoking consumption amount: 1 pack every day  Dicsussion on advise to quit smoking and smoking impacts: Cardiovascular lung impacts  Patient's willingness to quit: Willing to quit  Methods to quit smoking discussed: Behavioral modification  Medication management of smoking session drugs discussed: on wellbutrin   Resources provided:  AVS   Setting quit date not established  Follow-up arranged 4 month   Time spent counseling the patient: 5 minutes        History of attempted suicide    Not currently suicidal       History of substance abuse ([place])    Not using substances      Arm numbness    Rule out stroke assess with CT head      Relevant Orders   CT HEAD WO CONTRAST (5MM)    Return in about 6 weeks (around 12/27/2022) for htn, chronic conditions.     Asencion Noble, MD

## 2022-11-15 NOTE — Assessment & Plan Note (Signed)
Not currently suicidal °

## 2022-11-15 NOTE — Assessment & Plan Note (Signed)
History of chronic systolic heart failure we will renew all medications he appears to be clinically volume status normal blood pressure is good

## 2022-11-15 NOTE — Assessment & Plan Note (Signed)
Seen on screening CT of the lung continue lipid therapy check lipids

## 2022-11-16 ENCOUNTER — Ambulatory Visit: Payer: Medicare Other | Admitting: Family

## 2022-11-16 LAB — CBC WITH DIFFERENTIAL/PLATELET
Basophils Absolute: 0 10*3/uL (ref 0.0–0.2)
Basos: 1 %
EOS (ABSOLUTE): 0 10*3/uL (ref 0.0–0.4)
Eos: 1 %
Hematocrit: 53.6 % — ABNORMAL HIGH (ref 37.5–51.0)
Hemoglobin: 18.8 g/dL — ABNORMAL HIGH (ref 13.0–17.7)
Immature Grans (Abs): 0 10*3/uL (ref 0.0–0.1)
Immature Granulocytes: 0 %
Lymphocytes Absolute: 1.4 10*3/uL (ref 0.7–3.1)
Lymphs: 47 %
MCH: 33.9 pg — ABNORMAL HIGH (ref 26.6–33.0)
MCHC: 35.1 g/dL (ref 31.5–35.7)
MCV: 97 fL (ref 79–97)
Monocytes Absolute: 0.4 10*3/uL (ref 0.1–0.9)
Monocytes: 12 %
Neutrophils Absolute: 1.2 10*3/uL — ABNORMAL LOW (ref 1.4–7.0)
Neutrophils: 39 %
Platelets: 218 10*3/uL (ref 150–450)
RBC: 5.54 x10E6/uL (ref 4.14–5.80)
RDW: 13.8 % (ref 11.6–15.4)
WBC: 3 10*3/uL — ABNORMAL LOW (ref 3.4–10.8)

## 2022-11-16 LAB — BMP8+EGFR
BUN/Creatinine Ratio: 13 (ref 10–24)
BUN: 17 mg/dL (ref 8–27)
CO2: 23 mmol/L (ref 20–29)
Calcium: 9.4 mg/dL (ref 8.6–10.2)
Chloride: 106 mmol/L (ref 96–106)
Creatinine, Ser: 1.27 mg/dL (ref 0.76–1.27)
Glucose: 82 mg/dL (ref 70–99)
Potassium: 4.2 mmol/L (ref 3.5–5.2)
Sodium: 144 mmol/L (ref 134–144)
eGFR: 64 mL/min/{1.73_m2} (ref >59)

## 2022-11-16 NOTE — Progress Notes (Signed)
Let patient know his x-ray is completely clear there is no evidence of pneumonia

## 2022-11-16 NOTE — Progress Notes (Signed)
Let pt know blood counts stable, kidney improved

## 2022-11-17 ENCOUNTER — Telehealth: Payer: Self-pay

## 2022-11-17 NOTE — Telephone Encounter (Signed)
Pt was called and vm was left, Information has been sent to nurse pool.   

## 2022-11-17 NOTE — Telephone Encounter (Signed)
-----   Message from Storm Frisk, MD sent at 11/16/2022  2:39 PM EST ----- Let patient know his x-ray is completely clear there is no evidence of pneumonia

## 2022-11-17 NOTE — Telephone Encounter (Signed)
-----   Message from Storm Frisk, MD sent at 11/16/2022  5:38 AM EST ----- Let pt know blood counts stable, kidney improved

## 2022-11-29 ENCOUNTER — Other Ambulatory Visit: Payer: Medicare Other

## 2022-11-30 ENCOUNTER — Encounter (HOSPITAL_COMMUNITY): Payer: Self-pay | Admitting: Cardiology

## 2022-12-01 ENCOUNTER — Ambulatory Visit (HOSPITAL_COMMUNITY)
Admission: RE | Admit: 2022-12-01 | Discharge: 2022-12-01 | Disposition: A | Discharge status: Home / Self Care | Payer: Medicare Other | Source: Ambulatory Visit | Attending: Family Medicine | Admitting: Family Medicine

## 2022-12-01 ENCOUNTER — Encounter (HOSPITAL_COMMUNITY): Payer: Self-pay

## 2022-12-01 VITALS — BP 108/70 | HR 104 | Ht 69.0 in | Wt 202.0 lb

## 2022-12-01 DIAGNOSIS — I491 Atrial premature depolarization: Secondary | ICD-10-CM | POA: Diagnosis not present

## 2022-12-01 DIAGNOSIS — Z79899 Other long term (current) drug therapy: Secondary | ICD-10-CM | POA: Insufficient documentation

## 2022-12-01 DIAGNOSIS — I428 Other cardiomyopathies: Secondary | ICD-10-CM | POA: Diagnosis not present

## 2022-12-01 DIAGNOSIS — I4729 Other ventricular tachycardia: Secondary | ICD-10-CM

## 2022-12-01 DIAGNOSIS — F17210 Nicotine dependence, cigarettes, uncomplicated: Secondary | ICD-10-CM | POA: Diagnosis not present

## 2022-12-01 DIAGNOSIS — I472 Ventricular tachycardia, unspecified: Secondary | ICD-10-CM | POA: Insufficient documentation

## 2022-12-01 DIAGNOSIS — I110 Hypertensive heart disease with heart failure: Secondary | ICD-10-CM | POA: Insufficient documentation

## 2022-12-01 DIAGNOSIS — F1490 Cocaine use, unspecified, uncomplicated: Secondary | ICD-10-CM

## 2022-12-01 DIAGNOSIS — Z95810 Presence of automatic (implantable) cardiac defibrillator: Secondary | ICD-10-CM | POA: Insufficient documentation

## 2022-12-01 DIAGNOSIS — Z79624 Long term (current) use of inhibitors of nucleotide synthesis: Secondary | ICD-10-CM | POA: Insufficient documentation

## 2022-12-01 DIAGNOSIS — Z720 Tobacco use: Secondary | ICD-10-CM | POA: Diagnosis not present

## 2022-12-01 DIAGNOSIS — I5022 Chronic systolic (congestive) heart failure: Secondary | ICD-10-CM | POA: Diagnosis present

## 2022-12-01 DIAGNOSIS — B20 Human immunodeficiency virus [HIV] disease: Secondary | ICD-10-CM

## 2022-12-01 DIAGNOSIS — F329 Major depressive disorder, single episode, unspecified: Secondary | ICD-10-CM | POA: Insufficient documentation

## 2022-12-01 DIAGNOSIS — Z21 Asymptomatic human immunodeficiency virus [HIV] infection status: Secondary | ICD-10-CM | POA: Insufficient documentation

## 2022-12-01 MED ORDER — EMPAGLIFLOZIN 10 MG PO TABS: 10.0000 mg | ORAL_TABLET | Freq: Every day | ORAL | 11 refills | Status: DC

## 2022-12-01 MED ORDER — FUROSEMIDE 20 MG PO TABS: 20.0000 mg | ORAL_TABLET | ORAL | 3 refills | Status: DC | PRN

## 2022-12-01 NOTE — Progress Notes (Addendum)
Advanced Heart Failure Clinic Note   Patient ID: Ryan Long, male   DOB: 1961/02/07, 61 y.o.   MRN: [id number] ID: Ryan Long PCP: Ryan Long EP : Ryan Long  HF: Ryan Long [name] is a 61 y.o. male with HIV, HTN, major depressive disorder, chronic systolic heart failure (nonischemic cardiomyopathy), and polysubstance abuse.  Coronary CT angiogram in 3/12 demonstrated no CAD.  Cardiolite in 2015 showed no ischemia or infarction.  NICM was not felt to be related to HIV given his good CD4 counts. He is S/P St Jude ICD 12/2017.  Echo 09/2017 EF 25-30%   He was evaluated [facility] 12/25/17 with suicidal ideation. He was placed on IVC with psych consulted. He was later cleared and discharged.   Echo 04/20/21 EF 20-25% mild MR. RV ok   Last seen 5/22, NYHA I and volume OK. Given persistently reduced EF, he was ordered to do CPX test to objectively quantify exercise capacity. Unfortunately, he did not complete study and has not followed up since.   Saw EP 9/22 and device interrogation showed NSVT. Toprol increased and arranged for remotes at device clinic. Seen in ED 10/22 after defib fired x 2. Interrogation showed WCT ?VT. Cards consulted and recommended starting amiodarone, but he left AMA.  Today he returns for HF follow up. We have not seen him since 04/2021. Overall feeling fine. He has SOB walking further distances on flat ground. Can walk up stairs but has to take time. Denies palpitations, CP, dizziness, edema, or PND/Orthopnea. Appetite ok. No fever or chills. Weight at home 200-202 pounds. Taking all medications. Smokes 1/2 ppd, no ETOH, last used cocaine x 3-4 months.  Cardiac Studies: - Echo (5/22): EF 20-25%, mild MR, RV ok  - Echo (10/18): EF 25-30%  - Echo (9/16): EF 30-35%, diffuse hypokinesis, grade I diastolic dysfunction  - Echo (7/14): EF 35-40%  Past Medical History:  Diagnosis Date   Active smoker    AICD (automatic  cardioverter/defibrillator) present    Alcohol abuse    Anxiety    AR (allergic rhinitis)    Bipolar 1 disorder (HCC)    Cervical lymphadenitis 04/20/2021   CHF (congestive heart failure) (HCC)    Chronic bronchitis (HCC)    Chronic systolic heart failure ([place])    Controlled substance agreement signed 10/22/2018   COPD (chronic obstructive pulmonary disease) ([place])    Cough 12/31/2018   Crack cocaine use    Depression    Genital herpes    HIV (human immunodeficiency virus infection) ([place]) dx'd 08/1993   HLD (hyperlipidemia)    Hypertension    NICM (nonischemic cardiomyopathy) ([place])    Echocardiogram 06/28/11: EF 30-35%, mild MR, mild LAE;  No CAD by coronary CT angiogram 3/12 at [facility]   NSVT (nonsustained ventricular tachycardia) (HCC)    PTSD (post-traumatic stress disorder)     Current Outpatient Medications  Medication Sig Dispense Refill   aspirin EC 81 MG tablet Take 81 mg by mouth daily. Swallow whole.     bictegravir-emtricitabine-tenofovir AF (BIKTARVY) 50-200-25 MG TABS tablet Take 1 tablet by mouth daily. 30 tablet 5   eplerenone (INSPRA) 25 MG tablet TAKE 0.5 TABLET BY MOUTH EVERY DAY 45 tablet 6   fluticasone furoate-vilanterol (BREO ELLIPTA) 100-25 MCG/ACT AEPB Inhale 1 puff into the lungs daily. 1 each 11   furosemide (LASIX) 20 MG tablet Take 1 tablet (20 mg total) by mouth daily. 30 tablet 3   hydrOXYzine (ATARAX)  25 MG tablet Take 1-2 tablets (25-50 mg total) by mouth 2 (two) times daily as needed. 30 tablet 5   Melatonin 10 MG CAPS Take 10 mg by mouth at bedtime as needed (sleep).     metoprolol succinate (TOPROL-XL) 50 MG 24 hr tablet Take 1 tablet (50 mg total) by mouth daily. For high blood pressure 90 tablet 3   Multiple Vitamins-Minerals (MENS MULTIPLUS PO) Take 1 tablet by mouth daily.     nicotine (NICODERM CQ - DOSED IN MG/24 HOURS) 21 mg/24hr patch      potassium chloride SA (KLOR-CON M) 20 MEQ tablet Take 1 tablet (20 mEq total) by mouth  daily. 90 tablet 3   rosuvastatin (CRESTOR) 5 MG tablet Take 1 tablet (5 mg total) by mouth daily. 90 tablet 2   sacubitril-valsartan (ENTRESTO) 24-26 MG Take 1 tablet by mouth 2 (two) times daily. 60 tablet 1   sildenafil (VIAGRA) 50 MG tablet Take 1 tablet (50 mg total) by mouth as needed for erectile dysfunction. 10 tablet 3   tamsulosin (FLOMAX) 0.4 MG CAPS capsule TAKE 2 CAPSULES(0.8 MG) BY MOUTH DAILY AFTER SUPPER 60 capsule 5   traZODone (DESYREL) 50 MG tablet Take 1 tablet (50 mg total) by mouth at bedtime as needed. for sleep 30 tablet 5   valACYclovir (VALTREX) 500 MG tablet Take 1 tablet (500 mg total) by mouth daily. 60 tablet 4   Varenicline Tartrate, Starter, 0.5 MG X 11 & 1 MG X 42 TBPK Take one 0.5 mg tab by mouth once daily for 3 days, increase to one 0.5 mg tab twice daily for 4 days, increase to 1 mg tab twice daily. (Patient not taking: Reported on 12/01/2022)     No current facility-administered medications for this encounter.    Allergies: No Known Allergies   Review of systems complete and found to be negative unless listed in HPI.    BP 108/70   Pulse (!) 104   Ht 5\' 9"  (1.753 m)   Wt 91.6 kg (202 lb)   BMI 29.83 kg/m   Wt Readings from Last 3 Encounters:  12/01/22 91.6 kg (202 lb)  11/15/22 91.6 kg (202 lb)  08/08/22 91.6 kg (202 lb)    PHYSICAL EXAM: General:  NAD. No resp difficulty HEENT: Normal Neck: Supple. No JVD. Carotids 2+ bilat; no bruits. No lymphadenopathy or thryomegaly appreciated. Cor: PMI nondisplaced. Regular rate & rhythm. No rubs, gallops or murmurs. Lungs: Clear Abdomen: Soft, nontender, nondistended. No hepatosplenomegaly. No bruits or masses. Good bowel sounds. Extremities: No cyanosis, clubbing, rash, edema Neuro: Alert & oriented x 3, cranial nerves grossly intact. Moves all 4 extremities w/o difficulty. Affect pleasant.  ECG (personally reviewed): NSR with PACs, 98 bpm  Device interrogation: unable to interrogate device  today.  ASSESSMENT AND PLAN:  1. Chronic Systolic Heart Failure:  - NICM - Echo (09/2017): EF 20-25%.  - Echo (04/20/21): EF 20-25% mild MR. RV ok  - S/P St Jude ICD.  - Stable NYHA II.  Volume looks good today.  - Start Jardiance 10 mg daily. Discussed potential side effects - Change Lasix to 20  mg PRN. - Continue Entresto 24/26 mg bid.  - Continue Toprol XL 50 mg daily. - Continue Inspra 12.5 mg daily. - Given persistently reduced EF, will get CPX test to objectively quantify exercise capacity. - Update echo. - Recent labs OK; K 4.2, SCr 1.27 (11/15/22)  2. NSVT - Frequent NSVT 8/22 - Ordered stress echo at [facility] 7/22 but  never followed up - Continue Toprol. - Now followed in our device clinic. - Due for EP follow up  3. HIV  - He follows with ID.     4. Tobacco use disorder   - Smoking 1/2 ppd.  - We discussed use of nicotine lozenges or patches  5. Former Cocaine - Last used a few months ago - Encouraged complete cessation.  Follow up in 3 months with Ryan. Melynda Keller, FNP  12/01/2022

## 2022-12-01 NOTE — Addendum Note (Signed)
Encounter addended by: Jacklynn Ganong, FNP on: 12/01/2022 4:17 PM  Actions taken: Clinical Note Signed

## 2022-12-01 NOTE — Patient Instructions (Signed)
START Jardiance 10 mg one tab daily CHANGE Lasix to 20 mg as needed for weight gain or swelling   Your physician has recommended that you have a cardiopulmonary stress test (CPX). CPX testing is a non-invasive measurement of heart and lung function. It replaces a traditional treadmill stress test. This type of test provides a tremendous amount of information that relates not only to your present condition but also for future outcomes. This test combines measurements of you ventilation, respiratory gas exchange in the lungs, electrocardiogram (EKG), blood pressure and physical response before, during, and following an exercise protocol.  Your physician recommends that you schedule a follow-up appointment in: 3 months with Dr Gala Romney and echo'  Your physician has requested that you have an echocardiogram. Echocardiography is a painless test that uses sound waves to create images of your heart. It provides your doctor with information about the size and shape of your heart and how well your heart's chambers and valves are working. This procedure takes approximately one hour. There are no restrictions for this procedure. Please do NOT wear cologne, perfume, aftershave, or lotions (deodorant is allowed). Please arrive 15 minutes prior to your appointment time.  Do the following things EVERYDAY: Weigh yourself in the morning before breakfast. Write it down and keep it in a log. Take your medicines as prescribed Eat low salt foods--Limit salt (sodium) to 2000 mg per day.  Stay as active as you can everyday Limit all fluids for the day to less than 2 liters  At the Advanced [facility] Clinic, you and your health needs are our priority. As part of our continuing mission to provide you with exceptional heart care, we have created designated Provider Care Teams. These Care Teams include your primary Cardiologist (physician) and Advanced Practice Providers (APPs- Physician Assistants and Nurse  Practitioners) who all work together to provide you with the care you need, when you need it.   You may see any of the following providers on your designated Care Team at your next follow up: Dr Arvilla Meres Dr Marca Ancona Dr. Marcos Eke, NP Robbie Lis, [place] [facility] [place], [place] Brynda Peon, NP Karle Plumber, PharmD   Please be sure to bring in all your medications bottles to every appointment.

## 2022-12-04 ENCOUNTER — Other Ambulatory Visit: Payer: Medicare Other

## 2022-12-13 ENCOUNTER — Ambulatory Visit: Payer: Medicare Other | Admitting: Family

## 2022-12-14 ENCOUNTER — Telehealth: Payer: Self-pay | Admitting: Pharmacist

## 2022-12-14 ENCOUNTER — Encounter: Payer: Self-pay | Admitting: Critical Care Medicine

## 2022-12-14 DIAGNOSIS — B20 Human immunodeficiency virus [HIV] disease: Secondary | ICD-10-CM

## 2022-12-14 MED ORDER — BIKTARVY 50-200-25 MG PO TABS: 1.0000 | ORAL_TABLET | Freq: Every day | ORAL | 1 refills | Status: DC

## 2022-12-14 NOTE — Telephone Encounter (Signed)
Patient called the front desk wanting to speak to me. He states he has missed a few appointments and wanted to reschedule. He has missed 2 appointments with Tammy Sours. He is out of his Biktarvy after today so I sent in refills to [facility] for him. He asked about Flomax and Valtrex. Advised him that he should have enough refills of Valtrex at [facility] and told him that his Flomax is managed by his PCP, Dr. Delford Field and to reach out to them. I scheduled him on 12/19/22 with Tammy Sours and told him that if he no shows that appointment then he would be put back on my schedule for chronic no shows. He wrote it down and stated that he would make sure to come.  Alysha Doolan L. Brittnee Gaetano, PharmD, BCIDP, AAHIVP, CPP Clinical Pharmacist Practitioner Infectious Diseases Clinical Pharmacist [facility] 12/14/2022, 4:09 PM

## 2022-12-15 ENCOUNTER — Other Ambulatory Visit: Payer: Medicare Other

## 2022-12-15 MED ORDER — FINASTERIDE 5 MG PO TABS: 5.0000 mg | ORAL_TABLET | Freq: Every day | ORAL | 3 refills | Status: DC

## 2022-12-17 ENCOUNTER — Encounter: Payer: Self-pay | Admitting: Pharmacist

## 2022-12-17 ENCOUNTER — Encounter: Payer: Self-pay | Admitting: Critical Care Medicine

## 2022-12-17 DIAGNOSIS — I5022 Chronic systolic (congestive) heart failure: Secondary | ICD-10-CM

## 2022-12-18 ENCOUNTER — Encounter: Payer: Self-pay | Admitting: Pharmacist

## 2022-12-18 MED ORDER — METOPROLOL SUCCINATE ER 50 MG PO TB24: 50.0000 mg | ORAL_TABLET | Freq: Every day | ORAL | 3 refills | Status: DC

## 2022-12-19 ENCOUNTER — Ambulatory Visit: Admission: RE | Admit: 2022-12-19 | Payer: Medicare Other | Source: Ambulatory Visit

## 2022-12-19 ENCOUNTER — Other Ambulatory Visit: Payer: Self-pay

## 2022-12-19 ENCOUNTER — Encounter: Payer: Self-pay | Admitting: Family

## 2022-12-19 ENCOUNTER — Ambulatory Visit (INDEPENDENT_AMBULATORY_CARE_PROVIDER_SITE_OTHER): Payer: Medicare Other | Admitting: Family

## 2022-12-19 VITALS — BP 105/76 | HR 105 | Temp 97.9°F | Wt 203.0 lb

## 2022-12-19 DIAGNOSIS — R21 Rash and other nonspecific skin eruption: Secondary | ICD-10-CM | POA: Diagnosis not present

## 2022-12-19 DIAGNOSIS — Z0000 Encounter for general adult medical examination without abnormal findings: Secondary | ICD-10-CM

## 2022-12-19 DIAGNOSIS — N1831 Chronic kidney disease, stage 3a: Secondary | ICD-10-CM

## 2022-12-19 DIAGNOSIS — B20 Human immunodeficiency virus [HIV] disease: Secondary | ICD-10-CM | POA: Diagnosis not present

## 2022-12-19 MED ORDER — BIKTARVY 50-200-25 MG PO TABS: 1.0000 | ORAL_TABLET | Freq: Every day | ORAL | 5 refills | Status: DC

## 2022-12-19 MED ORDER — TRIAMCINOLONE ACETONIDE 0.1 % EX CREA: 1.0000 | TOPICAL_CREAM | Freq: Two times a day (BID) | CUTANEOUS | 0 refills | Status: DC

## 2022-12-19 NOTE — Progress Notes (Signed)
Brief Narrative   Patient ID: Ryan Long, male    DOB: 29-Dec-1960, 62 y.o.   MRN: [id number]  Ryan Long is a 62 y/o AA male diagnosed with HIV disease with risk factor of MSM. Initial viral load, CD4 count and Genosure are not available. No previous history of opportunistic infection. ART experience with darunavir/ritonavir, Atripla, Isentress, Truvada, Tivicay, Descovy and now [employer].    Subjective:    Chief Complaint  Patient presents with   Follow-up    B20 - c/o itchy rash under armpits x 2 months.     HPI:  Ryan Long is a 61 y.o. male with HIV disease last seen on 08/08/22 with well controlled virus and good adherence and tolerance to [place]. Viral load was undetectable and CD4 count 382. Here today for follow up.   Ryan Long has been doing okay since his last office visit. Has been having a rash located just below his left axilla that has been going on for about 2 months and improved a little with hydrocortisone cream and is itchy. No changes to skin care or laundering products and uses a Dove soap. Has not spread since initial onset. There is a small area on his right side. Continues to take Biktarvy with no adverse side effects or problems obtaining from the pharmacy. Thinking about going back to work. Condoms and STD testing offered. Declines influenza vaccination.   Denies fevers, chills, night sweats, headaches, changes in vision, neck pain/stiffness, nausea, diarrhea, vomiting, or lesions.  No Known Allergies    Outpatient Medications Prior to Visit  Medication Sig Dispense Refill   aspirin EC 81 MG tablet Take 81 mg by mouth daily. Swallow whole.     empagliflozin (JARDIANCE) 10 MG TABS tablet Take 1 tablet (10 mg total) by mouth daily before breakfast. 30 tablet 11   eplerenone (INSPRA) 25 MG tablet TAKE 0.5 TABLET BY MOUTH EVERY DAY 45 tablet 6   finasteride (PROSCAR) 5 MG tablet Take 1 tablet (5 mg total) by mouth daily. 30 tablet 3    fluticasone furoate-vilanterol (BREO ELLIPTA) 100-25 MCG/ACT AEPB Inhale 1 puff into the lungs daily. 1 each 11   furosemide (LASIX) 20 MG tablet Take 1 tablet (20 mg total) by mouth as needed. 30 tablet 3   hydrOXYzine (ATARAX) 25 MG tablet Take 1-2 tablets (25-50 mg total) by mouth 2 (two) times daily as needed. 30 tablet 5   Melatonin 10 MG CAPS Take 10 mg by mouth at bedtime as needed (sleep).     metoprolol succinate (TOPROL-XL) 50 MG 24 hr tablet Take 1 tablet (50 mg total) by mouth daily. For high blood pressure 90 tablet 3   Multiple Vitamins-Minerals (MENS MULTIPLUS PO) Take 1 tablet by mouth daily.     potassium chloride SA (KLOR-CON M) 20 MEQ tablet Take 1 tablet (20 mEq total) by mouth daily. 90 tablet 3   rosuvastatin (CRESTOR) 5 MG tablet Take 1 tablet (5 mg total) by mouth daily. 90 tablet 2   sacubitril-valsartan (ENTRESTO) 24-26 MG Take 1 tablet by mouth 2 (two) times daily. 60 tablet 1   sildenafil (VIAGRA) 50 MG tablet Take 1 tablet (50 mg total) by mouth as needed for erectile dysfunction. 10 tablet 3   tamsulosin (FLOMAX) 0.4 MG CAPS capsule TAKE 2 CAPSULES(0.8 MG) BY MOUTH DAILY AFTER SUPPER 60 capsule 5   traZODone (DESYREL) 50 MG tablet Take 1 tablet (50 mg total) by mouth at bedtime as needed. for sleep 30  tablet 5   valACYclovir (VALTREX) 500 MG tablet Take 1 tablet (500 mg total) by mouth daily. 60 tablet 4   bictegravir-emtricitabine-tenofovir AF (BIKTARVY) 50-200-25 MG TABS tablet Take 1 tablet by mouth daily. 30 tablet 1   nicotine (NICODERM CQ - DOSED IN MG/24 HOURS) 21 mg/24hr patch  (Patient not taking: Reported on 12/19/2022)     Varenicline Tartrate, Starter, 0.5 MG X 11 & 1 MG X 42 TBPK Take one 0.5 mg tab by mouth once daily for 3 days, increase to one 0.5 mg tab twice daily for 4 days, increase to 1 mg tab twice daily. (Patient not taking: Reported on 12/01/2022)     No facility-administered medications prior to visit.     Past Medical History:  Diagnosis  Date   Active smoker    AICD (automatic cardioverter/defibrillator) present    Alcohol abuse    Anxiety    AR (allergic rhinitis)    Bipolar 1 disorder (HCC)    Cervical lymphadenitis 04/20/2021   CHF (congestive heart failure) (HCC)    Chronic bronchitis (HCC)    Chronic systolic heart failure (HCC)    Controlled substance agreement signed 10/22/2018   COPD (chronic obstructive pulmonary disease) ([place])    Cough 12/31/2018   Crack cocaine use    Depression    Genital herpes    HIV (human immunodeficiency virus infection) ([place]) dx'd 08/1993   HLD (hyperlipidemia)    Hypertension    NICM (nonischemic cardiomyopathy) ([place])    Echocardiogram 06/28/11: EF 30-35%, mild MR, mild LAE;  No CAD by coronary CT angiogram 3/12 at [facility]   NSVT (nonsustained ventricular tachycardia) ([place])    PTSD (post-traumatic stress disorder)      Past Surgical History:  Procedure Laterality Date   CARDIAC DEFIBRILLATOR PLACEMENT  01/08/2018   ICD IMPLANT N/A 01/08/2018   Procedure: ICD IMPLANT;  Surgeon: Constance Haw, MD;  Location: [facility];  Service: Cardiovascular;  Laterality: N/A;      Review of Systems  Constitutional:  Negative for chills, diaphoresis, fatigue and fever.  Respiratory:  Negative for cough, chest tightness, shortness of breath and wheezing.   Cardiovascular:  Negative for chest pain.  Gastrointestinal:  Negative for abdominal pain, diarrhea, nausea and vomiting.  Skin:  Positive for rash.      Objective:    BP 105/76   Pulse (!) 105   Temp 97.9 F (36.6 C) (Oral)   Wt 203 lb (92.1 kg)   SpO2 97%   BMI 29.98 kg/m  Nursing note and vital signs reviewed.  Physical Exam Constitutional:      General: He is not in acute distress.    Appearance: He is well-developed.  Eyes:     Conjunctiva/sclera: Conjunctivae normal.  Cardiovascular:     Rate and Rhythm: Normal rate and regular rhythm.     Heart sounds: Normal heart sounds. No  murmur heard.    No friction rub. No gallop.  Pulmonary:     Effort: Pulmonary effort is normal. No respiratory distress.     Breath sounds: Normal breath sounds. No wheezing or rales.  Chest:     Chest wall: No tenderness.  Abdominal:     General: Bowel sounds are normal.     Palpations: Abdomen is soft.     Tenderness: There is no abdominal tenderness.  Musculoskeletal:     Cervical back: Neck supple.  Lymphadenopathy:     Cervical: No cervical adenopathy.  Skin:  General: Skin is warm and dry.     Findings: Rash present.     Comments: Dry area located caudal to the axilla with scaly/dry appearance and small raised bumps.   Neurological:     Mental Status: He is alert and oriented to person, place, and time.  Psychiatric:        Behavior: Behavior normal.        Thought Content: Thought content normal.        Judgment: Judgment normal.         12/19/2022   10:34 AM 08/08/2022    8:41 AM 10/03/2021    8:00 AM 09/12/2021    8:41 AM 05/23/2021    8:42 AM  Depression screen PHQ 2/9  Decreased Interest 0 0 2 0 0  Down, Depressed, Hopeless 0 0  0 0  PHQ - 2 Score 0 0 2 0 0  Altered sleeping   2  1  Tired, decreased energy   2  0  Change in appetite   0  0  Feeling bad or failure about yourself    0  0  Trouble concentrating   0  0  Moving slowly or fidgety/restless   0  0  Suicidal thoughts   0  0  PHQ-9 Score   6  1       Assessment & Plan:    Patient Active Problem List   Diagnosis Date Noted   Rash and nonspecific skin eruption 12/19/2022   Arm numbness 11/15/2022   Aortic atherosclerosis ([place]) 11/15/2022   Chronic kidney disease 08/08/2022   Healthcare maintenance 08/08/2022   Cigarette smoker 01/26/2022   ETD (Eustachian tube dysfunction), right 01/26/2022   Polycythemia 12/08/2021   Vitamin D deficiency 12/08/2021   Hip pain 01/06/2021   VT (ventricular tachycardia) ([place]) 08/25/2020   HIV positive ([place]) 07/12/2020   Atrial fibrillation ([place]) 06/10/2020    BPH (benign prostatic hyperplasia) 01/28/2020   HFrEF (heart failure with reduced ejection fraction) ([place]) 01/28/2020   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   AICD (automatic cardioverter/defibrillator) present 08/15/2018   Abnormal EKG 06/28/2018   Adjustment disorder with mixed disturbance of emotions and conduct    NICM (nonischemic cardiomyopathy) ([employer]) 01/08/2018   Anxiety 12/20/2017   Bipolar 1 disorder ([place]) 69/62/9528   Chronic systolic heart failure ([place]) 05/13/2017   HSV-2 infection 07/17/2016   History of attempted suicide 04/14/2016   History of alcoholism ([place]) 04/14/2016   History of substance abuse ([place]) 04/14/2016   Constipation 01/17/2016   COPD exacerbation ([place]) 10/04/2015   Abnormal thyroid stimulating hormone (TSH) level 10/04/2015   MDD (major depressive disorder), recurrent episode, severe ([place]) 09/23/2015   Hypertension 09/10/2015   PTSD (post-traumatic stress disorder) 08/07/2015   Primary insomnia 04/02/2015   Herpesviral infection of penis 04/02/2015   Generalized anxiety disorder 05/29/2014   Tobacco use disorder 02/17/2013   Heart failure ([place]) 09/19/2011   Other primary cardiomyopathies 08/24/2011   Human immunodeficiency virus (HIV) disease ([place]) 02/01/2009   Recurrent HSV (herpes simplex virus) 02/01/2009   Hyperlipidemia 02/01/2009   Allergic rhinitis 02/01/2009     Problem List Items Addressed This Visit       Musculoskeletal and Integument   Rash and nonspecific skin eruption    Rash appears dry and eczematic with possible dermatitis. Treat with topical corticosteroid and recommended moisturizing area. Follow up if symptoms worsen or do not improve.         Genitourinary   Chronic kidney disease  Stable and improved with most recent lab work from 1 month ago. No evidence of tenofovir associated nephropathy. Continue management of multiple comorbidities. Recheck renal function.         Other   Human immunodeficiency virus (HIV)  disease ([place]) - Primary    Jaykwon continues to have well controlled virus with good adherence and tolerance to [employer]. Reviewed previous lab work and discussed plan of care. Check lab work. Continue current dose of Biktarvy. Plan for follow up in 4 months or sooner if needed with lab work on the same day.       Relevant Medications   bictegravir-emtricitabine-tenofovir AF (BIKTARVY) 50-200-25 MG TABS tablet   Other Relevant Orders   HIV-1 RNA quant-no reflex-bld   T-helper cell (CD4)- ([facility] clinic only)   COMPLETE METABOLIC PANEL WITH GFR   Healthcare maintenance    Discussed importance of safe sexual practice and condom use. Condoms and STD testing offered.  Declines influenza vaccination.  Consider RSV vaccination        I am having Linzy Darling. Roser start on triamcinolone cream. I am also having him maintain his aspirin EC, Multiple Vitamins-Minerals (MENS MULTIPLUS PO), Melatonin, Entresto, nicotine, Varenicline Tartrate (Starter), eplerenone, fluticasone furoate-vilanterol, hydrOXYzine, potassium chloride SA, rosuvastatin, sildenafil, tamsulosin, traZODone, valACYclovir, empagliflozin, furosemide, finasteride, metoprolol succinate, and Biktarvy.   Meds ordered this encounter  Medications   triamcinolone cream (KENALOG) 0.1 %    Sig: Apply 1 Application topically 2 (two) times daily.    Dispense:  30 g    Refill:  0    Order Specific Question:   Supervising Provider    Answer:   Baxter Flattery, CYNTHIA [[id number]]   bictegravir-emtricitabine-tenofovir AF (BIKTARVY) 50-200-25 MG TABS tablet    Sig: Take 1 tablet by mouth daily.    Dispense:  30 tablet    Refill:  5    Order Specific Question:   Supervising Provider    Answer:   Carlyle Basques [[id number]]     Follow-up: Return in about 4 months (around 04/19/2023), or if symptoms worsen or fail to improve.   Terri Piedra, MSN, FNP-C Nurse Practitioner [facility] for Infectious Disease [place] number:  [phone]

## 2022-12-19 NOTE — Assessment & Plan Note (Signed)
[name] continues to have well controlled virus with good adherence and tolerance to [employer]. Reviewed previous lab work and discussed plan of care. Check lab work. Continue current dose of Biktarvy. Plan for follow up in 4 months or sooner if needed with lab work on the same day.

## 2022-12-19 NOTE — Assessment & Plan Note (Signed)
Stable and improved with most recent lab work from 1 month ago. No evidence of tenofovir associated nephropathy. Continue management of multiple comorbidities. Recheck renal function.

## 2022-12-19 NOTE — Patient Instructions (Addendum)
Nice to see you.  We will check your lab work today.  Continue to take your medication daily as prescribed.  Refills have been sent to the pharmacy.  Plan for follow up in 4 months or sooner if needed with lab work on the same day.  Have a great day and stay safe!  

## 2022-12-19 NOTE — Assessment & Plan Note (Signed)
Discussed importance of safe sexual practice and condom use. Condoms and STD testing offered.  Declines influenza vaccination.  Consider RSV vaccination

## 2022-12-19 NOTE — Assessment & Plan Note (Addendum)
Rash appears dry and eczematic with possible dermatitis. Treat with topical corticosteroid and recommended moisturizing area. Follow up if symptoms worsen or do not improve.

## 2022-12-20 LAB — T-HELPER CELL (CD4) - (RCID CLINIC ONLY)
CD4 % Helper T Cell: 43 % (ref 33–65)
CD4 T Cell Abs: 500 /uL (ref 400–1790)

## 2022-12-21 ENCOUNTER — Ambulatory Visit
Admission: RE | Admit: 2022-12-21 | Discharge: 2022-12-21 | Disposition: A | Discharge status: Home / Self Care | Payer: Medicare Other | Source: Ambulatory Visit | Attending: Critical Care Medicine | Admitting: Critical Care Medicine

## 2022-12-21 ENCOUNTER — Encounter: Payer: Self-pay | Admitting: Critical Care Medicine

## 2022-12-21 DIAGNOSIS — R200 Anesthesia of skin: Secondary | ICD-10-CM

## 2022-12-21 DIAGNOSIS — R29818 Other symptoms and signs involving the nervous system: Secondary | ICD-10-CM | POA: Diagnosis not present

## 2022-12-21 LAB — COMPLETE METABOLIC PANEL WITH GFR
AG Ratio: 1.9 (calc) (ref 1.0–2.5)
ALT: 31 U/L (ref 9–46)
AST: 26 U/L (ref 10–35)
Albumin: 4.2 g/dL (ref 3.6–5.1)
Alkaline phosphatase (APISO): 51 U/L (ref 35–144)
BUN: 12 mg/dL (ref 7–25)
CO2: 25 mmol/L (ref 20–32)
Calcium: 9 mg/dL (ref 8.6–10.3)
Chloride: 109 mmol/L (ref 98–110)
Creat: 1.3 mg/dL (ref 0.70–1.35)
Globulin: 2.2 g/dL (calc) (ref 1.9–3.7)
Glucose, Bld: 86 mg/dL (ref 65–99)
Potassium: 4.1 mmol/L (ref 3.5–5.3)
Sodium: 142 mmol/L (ref 135–146)
Total Bilirubin: 1.1 mg/dL (ref 0.2–1.2)
Total Protein: 6.4 g/dL (ref 6.1–8.1)
eGFR: 63 mL/min/{1.73_m2} (ref >=60)

## 2022-12-21 LAB — HIV-1 RNA QUANT-NO REFLEX-BLD
HIV 1 RNA Quant: NOT DETECTED Copies/mL
HIV-1 RNA Quant, Log: NOT DETECTED Log cps/mL

## 2022-12-21 NOTE — Progress Notes (Signed)
Let pt know CT head was normal no stroke

## 2022-12-22 ENCOUNTER — Telehealth: Payer: Self-pay

## 2022-12-22 NOTE — Telephone Encounter (Signed)
-----   Message from Ryan Stain, MD sent at 12/21/2022  4:44 PM EST ----- Let pt know CT head was normal no stroke

## 2022-12-22 NOTE — Telephone Encounter (Signed)
Pt was called and is aware of results, DOB was confirmed.  ?

## 2023-01-04 ENCOUNTER — Emergency Department (HOSPITAL_COMMUNITY)
Admission: EM | Admit: 2023-01-04 | Discharge: 2023-01-04 | Disposition: A | Discharge status: Home / Self Care | Payer: 59 | Attending: Emergency Medicine | Admitting: Emergency Medicine

## 2023-01-04 ENCOUNTER — Ambulatory Visit: Payer: 59 | Attending: Cardiology | Admitting: Cardiology

## 2023-01-04 DIAGNOSIS — Z7982 Long term (current) use of aspirin: Secondary | ICD-10-CM | POA: Insufficient documentation

## 2023-01-04 DIAGNOSIS — R61 Generalized hyperhidrosis: Secondary | ICD-10-CM | POA: Diagnosis not present

## 2023-01-04 DIAGNOSIS — R000 Tachycardia, unspecified: Secondary | ICD-10-CM | POA: Diagnosis not present

## 2023-01-04 DIAGNOSIS — F1410 Cocaine abuse, uncomplicated: Secondary | ICD-10-CM | POA: Diagnosis not present

## 2023-01-04 DIAGNOSIS — G4489 Other headache syndrome: Secondary | ICD-10-CM | POA: Diagnosis not present

## 2023-01-04 DIAGNOSIS — F1910 Other psychoactive substance abuse, uncomplicated: Secondary | ICD-10-CM

## 2023-01-04 NOTE — ED Provider Notes (Signed)
[facility] EMERGENCY DEPARTMENT Provider Note   CSN: [id number] Arrival date & time: 01/04/23  1130     History  No chief complaint on file.   Ryan Long is a 62 y.o. male.  The history is provided by the patient and medical records. No language interpreter was used.     62 year old male with hx of polysubstance use presenting requesting help with substance abuse.  Pt sts he has been clean from crack/cocaine x 9 months but relapsed yesterday when his roommate offer him drug.  He report using crack yesterday and now wanting help with drug rehab.  Denies SI/HI, denies any active pain such as chest pain or sob.  No other complaint  Home Medications Prior to Admission medications   Medication Sig Start Date End Date Taking? Authorizing Provider  aspirin EC 81 MG tablet Take 81 mg by mouth daily. Swallow whole.    [provider]  bictegravir-emtricitabine-tenofovir AF (BIKTARVY) 50-200-25 MG TABS tablet Take 1 tablet by mouth daily. 12/19/22   Golden Circle, FNP  empagliflozin (JARDIANCE) 10 MG TABS tablet Take 1 tablet (10 mg total) by mouth daily before breakfast. 12/01/22   Rafael Bihari, FNP  eplerenone (INSPRA) 25 MG tablet TAKE 0.5 TABLET BY MOUTH EVERY DAY 11/13/22   Elsie Stain, MD  finasteride (PROSCAR) 5 MG tablet Take 1 tablet (5 mg total) by mouth daily. 12/15/22   Elsie Stain, MD  fluticasone furoate-vilanterol (BREO ELLIPTA) 100-25 MCG/ACT AEPB Inhale 1 puff into the lungs daily. 11/15/22   Elsie Stain, MD  furosemide (LASIX) 20 MG tablet Take 1 tablet (20 mg total) by mouth as needed. 12/01/22   [place], Maricela Bo, FNP  hydrOXYzine (ATARAX) 25 MG tablet Take 1-2 tablets (25-50 mg total) by mouth 2 (two) times daily as needed. 11/15/22   Elsie Stain, MD  Melatonin 10 MG CAPS Take 10 mg by mouth at bedtime as needed (sleep).    [provider]  metoprolol succinate (TOPROL-XL) 50 MG 24 hr tablet  Take 1 tablet (50 mg total) by mouth daily. For high blood pressure 12/18/22   Elsie Stain, MD  Multiple Vitamins-Minerals (MENS MULTIPLUS PO) Take 1 tablet by mouth daily.    [provider]  nicotine (NICODERM CQ - DOSED IN MG/24 HOURS) 21 mg/24hr patch     [provider]  potassium chloride SA (KLOR-CON M) 20 MEQ tablet Take 1 tablet (20 mEq total) by mouth daily. 11/15/22   Elsie Stain, MD  rosuvastatin (CRESTOR) 5 MG tablet Take 1 tablet (5 mg total) by mouth daily. 11/15/22   Elsie Stain, MD  sacubitril-valsartan (ENTRESTO) 24-26 MG Take 1 tablet by mouth 2 (two) times daily. 06/06/22   Kuppelweiser, Cassie L, RPH-CPP  sildenafil (VIAGRA) 50 MG tablet Take 1 tablet (50 mg total) by mouth as needed for erectile dysfunction. 11/15/22   Elsie Stain, MD  tamsulosin (FLOMAX) 0.4 MG CAPS capsule TAKE 2 CAPSULES(0.8 MG) BY MOUTH DAILY AFTER SUPPER 11/15/22   Elsie Stain, MD  traZODone (DESYREL) 50 MG tablet Take 1 tablet (50 mg total) by mouth at bedtime as needed. for sleep 11/15/22   Elsie Stain, MD  triamcinolone cream (KENALOG) 0.1 % Apply 1 Application topically 2 (two) times daily. 12/19/22   Golden Circle, FNP  valACYclovir (VALTREX) 500 MG tablet Take 1 tablet (500 mg total) by mouth daily. 11/15/22   Elsie Stain, MD  Varenicline Tartrate, Starter, 0.5 MG X 11 & 1 MG X 42 TBPK Take one 0.5 mg tab by mouth once daily for 3 days, increase to one 0.5 mg tab twice daily for 4 days, increase to 1 mg tab twice daily. Patient not taking: Reported on 12/01/2022 01/26/22   [provider]      Allergies    Patient has no known allergies.    Review of Systems   Review of Systems  All other systems reviewed and are negative.   Physical Exam Updated Vital Signs BP 103/78 (BP Location: Right Arm)   Pulse 98   Temp 98.8 F (37.1 C)   Resp 18   SpO2 98%  Physical Exam Vitals and nursing note reviewed.  Constitutional:       General: He is not in acute distress.    Appearance: He is well-developed.  HENT:     Head: Atraumatic.  Eyes:     Conjunctiva/sclera: Conjunctivae normal.  Cardiovascular:     Rate and Rhythm: Normal rate and regular rhythm.     Pulses: Normal pulses.     Heart sounds: Normal heart sounds.  Pulmonary:     Effort: Pulmonary effort is normal.     Breath sounds: Normal breath sounds.  Abdominal:     Palpations: Abdomen is soft.     Tenderness: There is no abdominal tenderness.  Musculoskeletal:     Cervical back: Neck supple.  Skin:    Findings: No rash.  Neurological:     Mental Status: He is alert.     ED Results / Procedures / Treatments   Labs (all labs ordered are listed, but only abnormal results are displayed) Labs Reviewed - No data to display  EKG None  Radiology No results found.  Procedures Procedures    Medications Ordered in ED Medications - No data to display  ED Course/ Medical Decision Making/ A&P                             Medical Decision Making  BP 103/78 (BP Location: Right Arm)   Pulse 98   Temp 98.8 F (37.1 C)   Resp 18   SpO2 98%   85:64 AM  62 year old male with hx of polysubstance use presenting requesting help with substance abuse.  Pt sts he has been clean from crack/cocaine x 9 months but relapsed yesterday when his roommate offer him drug.  He report using crack yesterday and now wanting help with drug rehab.  Denies SI/HI, denies any active pain such as chest pain or sob.  No other complaint  On exam pt is resting comfortable in no acute discomfort, heart/lungs are normal.  Pt mentating appropriately and is reasonable.  Pt is amenable to f/u with [facility] for further help with drug detox.  Additional resources provided as well.  Return precaution given.  I have considered labs and imaging but in the setting of pt at baseline without SI/HI or active pain he can be discharge with appropriate resource.            Final  Clinical Impression(s) / ED Diagnoses Final diagnoses:  Drug abuse [facility])    Rx / DC Orders ED Discharge Orders     None         Domenic Moras, PA-C 01/04/23 1200    Dorie Rank, MD 01/05/23 [phone]

## 2023-01-04 NOTE — ED Triage Notes (Signed)
Patient BIB [facility] from a [facility] where he had walked from his home after smoking cocaine multiple times yesterday and this morning after abstaining for approximately eight months. Patient here with complaint of dizziness and diaphoresis.  BP 108/72 HR 112 98% on room air CBG 110

## 2023-01-04 NOTE — Discharge Instructions (Addendum)
Please follow up with [facility] and seek additional help with substance use.  I hope you get back to the road of recovery.

## 2023-01-11 ENCOUNTER — Encounter: Payer: Self-pay | Admitting: Critical Care Medicine

## 2023-01-14 MED ORDER — FLUTICASONE FUROATE-VILANTEROL 100-25 MCG/ACT IN AEPB: 1.0000 | INHALATION_SPRAY | Freq: Every day | RESPIRATORY_TRACT | 11 refills | Status: DC

## 2023-01-17 ENCOUNTER — Ambulatory Visit: Payer: Medicare Other | Admitting: Critical Care Medicine

## 2023-01-18 ENCOUNTER — Telehealth: Payer: Self-pay | Admitting: Emergency Medicine

## 2023-01-18 NOTE — Telephone Encounter (Signed)
Copied from [place] [phone]. Topic: General - Other >> Jan 17, 2023  4:58 PM [place] B wrote: Reason for CRM: Patient called in wanted to make appt to speak with social worker.

## 2023-01-22 ENCOUNTER — Encounter: Payer: Self-pay | Admitting: Critical Care Medicine

## 2023-01-22 MED ORDER — ROSUVASTATIN CALCIUM 5 MG PO TABS: 5.0000 mg | ORAL_TABLET | Freq: Every day | ORAL | 2 refills | Status: DC

## 2023-01-23 ENCOUNTER — Encounter (HOSPITAL_BASED_OUTPATIENT_CLINIC_OR_DEPARTMENT_OTHER): Payer: 59 | Admitting: Critical Care Medicine

## 2023-01-23 DIAGNOSIS — J4541 Moderate persistent asthma with (acute) exacerbation: Secondary | ICD-10-CM | POA: Diagnosis not present

## 2023-01-23 MED ORDER — FLUTICASONE PROPIONATE 50 MCG/ACT NA SUSP: 2.0000 | Freq: Every day | NASAL | 6 refills | Status: DC

## 2023-01-23 MED ORDER — AZELASTINE HCL 0.1 % NA SOLN: 2.0000 | Freq: Two times a day (BID) | NASAL | 12 refills | Status: DC

## 2023-01-24 ENCOUNTER — Encounter: Payer: Self-pay | Admitting: Critical Care Medicine

## 2023-01-24 DIAGNOSIS — J45909 Unspecified asthma, uncomplicated: Secondary | ICD-10-CM | POA: Insufficient documentation

## 2023-01-24 MED ORDER — PREDNISONE 10 MG PO TABS: ORAL_TABLET | ORAL | 0 refills | Status: DC

## 2023-01-24 NOTE — Telephone Encounter (Signed)
Please see the MyChart message reply(ies) for my assessment and plan.    This patient gave consent for this Medical Advice Message and is aware that it may result in a bill to [employer], as well as the possibility of receiving a bill for a co-payment or deductible. They are an established patient, but are not seeking medical advice exclusively about a problem treated during an in person or video visit in the last seven days. I did not recommend an in person or video visit within seven days of my reply.    I spent a total of 8 minutes cumulative time within 7 days through [employer].  Asencion Noble, MD

## 2023-01-25 ENCOUNTER — Encounter: Payer: Self-pay | Admitting: Critical Care Medicine

## 2023-01-26 MED ORDER — OLOPATADINE HCL 0.1 % OP SOLN: 1.0000 [drp] | Freq: Two times a day (BID) | OPHTHALMIC | 12 refills | Status: DC

## 2023-01-31 ENCOUNTER — Encounter: Payer: Self-pay | Admitting: Critical Care Medicine

## 2023-02-04 ENCOUNTER — Encounter: Payer: Self-pay | Admitting: Critical Care Medicine

## 2023-02-05 MED ORDER — EPLERENONE 25 MG PO TABS: ORAL_TABLET | ORAL | 6 refills | Status: DC

## 2023-02-06 ENCOUNTER — Encounter: Payer: Self-pay | Admitting: Critical Care Medicine

## 2023-02-06 ENCOUNTER — Telehealth: Payer: Self-pay | Admitting: Critical Care Medicine

## 2023-02-06 DIAGNOSIS — J3089 Other allergic rhinitis: Secondary | ICD-10-CM

## 2023-02-06 DIAGNOSIS — J4541 Moderate persistent asthma with (acute) exacerbation: Secondary | ICD-10-CM

## 2023-02-06 NOTE — Telephone Encounter (Signed)
I reviewed mold report significant levels in home he is in a rooming house  Plan to refer to allergy for testing   Pt to hire atty and may just leave home as he does not have a lease pays month to month

## 2023-02-08 NOTE — Telephone Encounter (Signed)
Please follow up and evaluate clients needs from notes and client. Provide resources as needed.

## 2023-02-13 ENCOUNTER — Encounter: Payer: 59 | Admitting: Cardiology

## 2023-02-14 ENCOUNTER — Other Ambulatory Visit: Payer: Self-pay | Admitting: Pharmacist

## 2023-02-14 ENCOUNTER — Encounter: Payer: Self-pay | Admitting: Pharmacist

## 2023-02-14 DIAGNOSIS — B20 Human immunodeficiency virus [HIV] disease: Secondary | ICD-10-CM

## 2023-02-16 ENCOUNTER — Encounter: Payer: Self-pay | Admitting: Critical Care Medicine

## 2023-02-20 ENCOUNTER — Encounter: Payer: Self-pay | Admitting: Critical Care Medicine

## 2023-02-20 NOTE — Telephone Encounter (Signed)
Patient was called but no longer wants to receive social work services.

## 2023-03-11 NOTE — Progress Notes (Signed)
Advanced Heart Failure Clinic Note   Patient ID: Ryan Long, male   DOB: 07-16-1961, 62 y.o.   MRN: [id number] ID: Dr Tommy Medal PCP: Dr Lyda Jester EP : Dr Curt Bears  HF: Dr Ulyess Mort Tri Nowling is a 62 y.o. male with HIV, HTN, major depression, chronic systolic heart failure (nonischemic cardiomyopathy), and polysubstance abuse.    Coronary CT 3/12 -> no CAD.  Cardiolite in 2015 showed no ischemia or infarction.  NICM was not felt to be related to HIV given his good CD4 counts. He is S/P St Jude ICD 12/2017.  Echo 09/2017 EF 25-30%   He was evaluated [facility] 1/19 with suicidal ideation. He was placed on IVC. He was later cleared and discharged.   Echo 04/20/21 EF 20-25% mild MR. RV ok   In 5/22 referred for CPX test to objectively quantify exercise capacity. Unfortunately, he did not complete study and was lost to f/u  Saw EP 9/22 and device interrogation showed NSVT. Seen in ED 10/22 after defib fired x 2. Interrogation showed WCT ?VT. Cards consulted and recommended starting amiodarone, but he left AMA.  Seen in HF clinic in 12/13 for first time since 5/22. Doing ok. Echo ordered but he did not f/u  Seen in ED 1/24 with dizziness and diaphoresis after using cocaine.   Today he returns for HF follow up.   Cardiac Studies: - Echo (5/22): EF 20-25%, mild MR, RV ok  - Echo (10/18): EF 25-30%  - Echo (9/16): EF 30-35%, diffuse hypokinesis, grade I diastolic dysfunction  - Echo (7/14): EF 35-40%  Past Medical History:  Diagnosis Date   Active smoker    AICD (automatic cardioverter/defibrillator) present    Alcohol abuse    Anxiety    AR (allergic rhinitis)    Bipolar 1 disorder (HCC)    Cervical lymphadenitis 04/20/2021   CHF (congestive heart failure) (HCC)    Chronic bronchitis (HCC)    Chronic systolic heart failure ([place])    Controlled substance agreement signed 10/22/2018   COPD (chronic obstructive pulmonary disease) ([place])    Cough 12/31/2018   Crack  cocaine use    Depression    Genital herpes    HIV (human immunodeficiency virus infection) ([place]) dx'd 08/1993   HLD (hyperlipidemia)    Hypertension    NICM (nonischemic cardiomyopathy) ([place])    Echocardiogram 06/28/11: EF 30-35%, mild MR, mild LAE;  No CAD by coronary CT angiogram 3/12 at [facility]   NSVT (nonsustained ventricular tachycardia) (HCC)    PTSD (post-traumatic stress disorder)     Current Outpatient Medications  Medication Sig Dispense Refill   aspirin EC 81 MG tablet Take 81 mg by mouth daily. Swallow whole.     bictegravir-emtricitabine-tenofovir AF (BIKTARVY) 50-200-25 MG TABS tablet Take 1 tablet by mouth daily. 30 tablet 5   eplerenone (INSPRA) 25 MG tablet TAKE 0.5 TABLET BY MOUTH EVERY DAY 45 tablet 6   fluticasone furoate-vilanterol (BREO ELLIPTA) 100-25 MCG/ACT AEPB Inhale 1 puff into the lungs daily. 1 each 11   furosemide (LASIX) 20 MG tablet Take 1 tablet (20 mg total) by mouth daily. 30 tablet 3   hydrOXYzine (ATARAX) 25 MG tablet Take 1-2 tablets (25-50 mg total) by mouth 2 (two) times daily as needed. 30 tablet 5   Melatonin 10 MG CAPS Take 10 mg by mouth at bedtime as needed (sleep).     metoprolol succinate (TOPROL-XL) 50 MG 24 hr tablet Take 1 tablet (50 mg  total) by mouth daily. For high blood pressure 90 tablet 3   Multiple Vitamins-Minerals (MENS MULTIPLUS PO) Take 1 tablet by mouth daily.     nicotine (NICODERM CQ - DOSED IN MG/24 HOURS) 21 mg/24hr patch      potassium chloride SA (KLOR-CON M) 20 MEQ tablet Take 1 tablet (20 mEq total) by mouth daily. 90 tablet 3   rosuvastatin (CRESTOR) 5 MG tablet Take 1 tablet (5 mg total) by mouth daily. 90 tablet 2   sacubitril-valsartan (ENTRESTO) 24-26 MG Take 1 tablet by mouth 2 (two) times daily. 60 tablet 1   sildenafil (VIAGRA) 50 MG tablet Take 1 tablet (50 mg total) by mouth as needed for erectile dysfunction. 10 tablet 3   tamsulosin (FLOMAX) 0.4 MG CAPS capsule TAKE 2 CAPSULES(0.8 MG) BY  MOUTH DAILY AFTER SUPPER 60 capsule 5   traZODone (DESYREL) 50 MG tablet Take 1 tablet (50 mg total) by mouth at bedtime as needed. for sleep 30 tablet 5   valACYclovir (VALTREX) 500 MG tablet Take 1 tablet (500 mg total) by mouth daily. 60 tablet 4   Varenicline Tartrate, Starter, 0.5 MG X 11 & 1 MG X 42 TBPK Take one 0.5 mg tab by mouth once daily for 3 days, increase to one 0.5 mg tab twice daily for 4 days, increase to 1 mg tab twice daily. (Patient not taking: Reported on 12/01/2022)     No current facility-administered medications for this encounter.    Allergies: No Known Allergies   Review of systems complete and found to be negative unless listed in HPI.    BP 108/70   Pulse (!) 104   Ht 5\' 9"  (1.753 m)   Wt 91.6 kg (202 lb)   BMI 29.83 kg/m   Wt Readings from Last 3 Encounters:  12/01/22 91.6 kg (202 lb)  11/15/22 91.6 kg (202 lb)  08/08/22 91.6 kg (202 lb)    PHYSICAL EXAM: General:  NAD. No resp difficulty HEENT: Normal Neck: Supple. No JVD. Carotids 2+ bilat; no bruits. No lymphadenopathy or thryomegaly appreciated. Cor: PMI nondisplaced. Regular rate & rhythm. No rubs, gallops or murmurs. Lungs: Clear Abdomen: Soft, nontender, nondistended. No hepatosplenomegaly. No bruits or masses. Good bowel sounds. Extremities: No cyanosis, clubbing, rash, edema Neuro: Alert & oriented x 3, cranial nerves grossly intact. Moves all 4 extremities w/o difficulty. Affect pleasant.  ECG (personally reviewed): NSR with PACs, 98 bpm  Device interrogation: unable to interrogate device today.  ASSESSMENT AND PLAN:  1. Chronic Systolic Heart Failure:  - NICM - Echo (09/2017): EF 20-25%.  - Echo (04/20/21): EF 20-25% mild MR. RV ok  - S/P St Jude ICD.  - Stable NYHA II.  Volume looks good today.  - Start Jardiance 10 mg daily. Discussed potential side effects - Change Lasix to 20  mg PRN. - Continue Entresto 24/26 mg bid.  - Continue Toprol XL 50 mg daily. - Continue Inspra 12.5  mg daily. - Given persistently reduced EF, will get CPX test to objectively quantify exercise capacity. - Update echo. - Recent labs OK; K 4.2, SCr 1.27 (11/15/22)  2. NSVT - Frequent NSVT 8/22 - Ordered stress echo at [facility] 7/22 but never followed up - Continue Toprol. - Now followed in our device clinic. - Due for EP follow up  3. HIV  - He follows with ID.     4. Tobacco use disorder   - Smoking 1/2 ppd.  - We discussed use of nicotine lozenges or patches  5. Cocaine abuse - Last used a few months ago - Encouraged complete cessation.  6. Noncompliance - this has been a major hurdle in his care  Glori Bickers, MD  11:55 PM

## 2023-03-12 ENCOUNTER — Ambulatory Visit (HOSPITAL_COMMUNITY): Admission: RE | Admit: 2023-03-12 | Payer: 59 | Source: Ambulatory Visit

## 2023-03-12 ENCOUNTER — Inpatient Hospital Stay (HOSPITAL_COMMUNITY)
Admission: RE | Admit: 2023-03-12 | Discharge: 2023-03-12 | Disposition: A | Discharge status: Home / Self Care | Payer: Medicare Other | Source: Ambulatory Visit | Attending: Internal Medicine | Admitting: Internal Medicine

## 2023-03-15 ENCOUNTER — Other Ambulatory Visit (HOSPITAL_COMMUNITY): Payer: Self-pay | Admitting: Cardiology

## 2023-03-15 ENCOUNTER — Other Ambulatory Visit: Payer: Self-pay | Admitting: Critical Care Medicine

## 2023-03-15 DIAGNOSIS — I5022 Chronic systolic (congestive) heart failure: Secondary | ICD-10-CM

## 2023-03-25 NOTE — Progress Notes (Deleted)
Established visit     Patient: Ryan Long, Male    DOB: 1961/08/11, 62 y.o.   MRN: [id number]  Subjective  No chief complaint on file.   Ryan Long is a 62 y.o. male  09/12/21 Ryan Long presents for primary care follow-up visit.  Note patient has been seen over the summer at [facility] for cardiac  care and also seen locally for cardiology.  Patient has ejection fraction 25% has an AICD in place and had an episode September 12 where he became lightheaded and went to the emergency room.  He had previously had his AICD fired on August 25 but he did not seek evaluation at that time.  Below is documentation from the September ER visit.  He did not stay for completion of evaluation and left AGAINST MEDICAL ADVICE.  Recent hosp stay/ED visits and w/u at [facility] and here with cardiology   08/29/21 ED Ryan Long is a 62 y.o. male who presents with dizziness. He was at the heart failure clinic today when he had onset of light headedness, weakness and tunnel vision. He states: " I feel like I am going to fall out." He has been having these symptoms daily for the past several weeks. His ICD fired on Aug 25, but he did not seek evaluation at that time. He notes associated palpitations. He denies vertigo.  Patient appears irritated. He states that he does not want to be seen in trauma a room because "there is no TV or no food in here."  Patient also states "I have insurance."  I explained to the patient that we got him into the first available room because he has symptoms very concerning for potential arrhythmia.  I discussed with the patient that he could be having life-threatening arrhythmias and potentially needs some adjustment to his defibrillator given his history of heart failure with an EF of less than 25%.  The patient understands fully that this could be a life-threatening issue leading to potential severe morbidity or mortality.  He refuses any  further services and will leave AGAINST MEDICAL ADVICE.Date: 08/29/2021 Patient: Ryan Long Admitted: 08/29/2021  9:39 AM Attending Provider: Gwyneth Sprout, MD   Ryan Long or his authorized caregiver has made the decision for the patient to leave the emergency department against the advice of [facility]   He or his authorized caregiver has been informed and understands the inherent risks, including death.  He or his authorized caregiver has decided to accept the responsibility for this decision. Ryan Long and all necessary parties have been advised that he may return for further evaluation or treatment. His condition at time of discharge was Stable.  Ryan Long had current vital signs as follows:   Blood pressure 109/69, pulse 95, temperature 98.4 F (36.9 C), temperature source Temporal, resp. rate 20, height 5\' 9"  (1.753 m), weight 91.3 kg, SpO2 100 %.          Ryan Bonito Bazin or his authorized caregiver has not signed the Leaving Against Medical Advice form prior to leaving the department.  Below is a prior documentation from a visit at [facility] cardiology office in July Cardiology OV 06/2021 at [facility] Cardiovascular Problem List:  1. Nonischemic cardiomyopathy  A. TTE (02/26/14):  The left ventricular size is normal.  There is mild concentric left ventricular hypertrophy.  There is mild global hypokinesis of the left ventricle.  LVEF 45-50%. The right ventricle is normal in size and  function.  No significant stenosis or regurgitation seen  There is no pericardial effusion.  There is no comparison study available.  B. ESE (03/03/14):  The patient had chest pain in both stress and recovery.  The patient achieved 91 % of maximum predicted heart rate.  The METS achieved was 7.  Exercise capacity was fair.  Normal left ventricular function and global wall motion with stress.  Negative exercise echocardiography for  inducible ischemia at target heart  rate.  Negative stress ECG for inducible ischemia at target heart rate.   C. 2019 Implantation of primary prevention [employer] ICD by Dr. Loman BrooklynWill Camnitz at [facility]  D. TTE [facility] 03/04/19):  LVEF 25%, mild aortic stenosis, moderate mitral regurgitation, normal RV size and function  SUBJECTIVE: Ryan ShellBarry Mitchell Long is a [age] AAM with the above cardiovascular problem list who I last saw with my cardiology fellow, Dr. Mickeal Skinnered Hodges on 07/08/19. The patient did not showed for cardiology clinic visit on 05/25/20. He now presents for "follow-up".  The patient is a poor historian and has had sporadic follow-up at [facility]. He has a history of heart failure with reduced ejection fraction secondary to nonischemic cardiomyopathy thought to be related to poorly controlled HIV. He has had placement of [employer] single-chamber ICD for primary prevention in January 2019. His clinical course is confounded by bipolar 1 disorder and polysubstance abuse. The patient has seen multiple providers in the area including [facility] and [facility] over the last several years. He most recently had been [facility] by Dr. Rolm Bookbinderan Benshimone at Dr. Loman BrooklynWill Camnitz, EP. The patient now tells me that he now wants to be seen exclusively at [facility]. The patient had been seen by Dr. Alfonso EllisBeaty in the past for his ICD device - but the last monitoring of this was on 07/12/20. The patient now tells me that he wants to re-establish with Dr. Alfonso EllisBeaty and is scheduled to see him in December.  The patient denied chest pain. He has SOB on moderate to strenuous exertion. He denied PND, orthopnea, or pedal edema. The patient has not experienced palpitations, dizziness, or syncope. The patient tells me that he wants to "go back to work" but the cardiologists at [facility] wanted him to have an exercise stress echo to see what his functional status may be. The patient has been frustrated because two exercise stress  echoes have been rescheduled [facility] and that is why he is here today. He wants to have the stress test done here.  PMH: Past Medical History:  Diagnosis Date   Anxiety   CHF (congestive heart failure) (HCC)   Depression   Human immunodeficiency virus (HIV) disease (HCC)   Hypertension   ICD (implantable cardioverter-defibrillator) in place   Mood disorder (HCC)   Polysubstance dependence (HCC)  Social History Narrative  Born in [place], Mr. Janice NorrieLowery indicated that he moved around a lot as a child due to his father having been in the [employer]. The family settled in [place] after his father retired from [employer] military. Recalling that his parents fought, he said they separated when he was about age 62. After the separation, he said, he had to fight off sexual advances from his mother. His parents divorced in about [id number], and his father died two years later. His mother reportedly lives in [place]. The older of two children, he noted having a brother who works as a Curatormechanic in [place] whom he has not seen in over five years.  From 1981-82 Mr. Heilman studied at [employer] in [place]. In August 2014 he enrolled in a business administration program at [employer] in [place] but he dropped out in December in the wake of being denied disability, losing his housing support, and being hospitalized for depression. He appealed the disability decision and was awarded benefits in September 2015. Since receiving his benefits he has moved among [address], [place], and [place] living in rooming houses, motel rooms, or shelters while trying to find a stable residence.   Never married, Mr. Vercher said he dates women and that his longest relationship lasted about one year. Not currently in a relationship, he indicated having no children. Describing himself as Marilynne Drivers, he indicated that he has attended church some in the past year or so. Saying that socially he mostly stays to himself, Mr.  Rone said he likes watching sports, playing ping-pong or billiards, and going to movies.   Immunizations Immunization History  Administered Date(s) Administered   Flu Vaccine 82mo and up (FLUZONE/FLULAVAL/FLUARIX VIAL OR SYRINGE) INACTIVATED QUADRAVALENT 08/15/2019   Influenza TIV (IM) 09/29/2010   Influenza Whole 09/20/2009, 08/24/2011   Janssen SARS-CoV-2 Vaccine 03/05/2020   Tdap (ADACEL, BOOSTRIX) 11/19/2007, 02/10/2014   hepatitis B (ENGERIX-B, RECOMBIVAX HB) 08/24/2011, 09/25/2011   influenza (Historical) 11/17/2008, 09/29/2010, 09/25/2011, 09/16/2012, 10/16/2013, 12/22/2013, 08/16/2015, 10/10/2015, 11/17/2016, 09/12/2017, 10/18/2017, 09/04/2018, 10/29/2019   pneumococcal conjugate 13-valent (PREVNAR) 02/10/2014   pneumococcal polysaccharide 23-valent (PNEUMOVAX 23) 05/23/2004, 10/01/2009, 01/17/2016   tuberculin PPD Test (APLISOL, TUBERSOL) 11/19/2007, 11/01/2010   zoster vaccine [facility]) (Historical) 06/13/2018, 08/13/2018   ?  I personally reviewed the patient's general problem list: Patient Active Problem List  Diagnosis   Human immunodeficiency virus (HIV) disease (HCC)   Major depression, recurrent (HCC)   Anxiety   HFrEF (heart failure with reduced ejection fraction) (HCC)   Non-ischemic cardiomyopathy (HCC)   Cannabis abuse, episodic   Cocaine abuse, episodic (HCC)   Primary insomnia   HSV-2 infection   Achilles tendinitis, left leg   Encounter to establish care   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HCC)   Tobacco use disorder   Hyperlipidemia   History of attempted suicide   History of alcoholism (HCC)   Cocaine use disorder, severe, dependence (HCC)   BPH (benign prostatic hyperplasia)   Atrial fibrillation (HCC)   Alcohol use disorder, severe, dependence (HCC)   AICD (automatic cardioverter/defibrillator) present   PTSD (post-traumatic stress disorder)   I personally reviewed his Care Everywhere chart. Of note: Coronary CTA done at  [facility] (03/12/11): 1. Normal appearing coronary arteries.  2. Severe left ventricular dysfunction with moderate dilated cardiomyopathy.   ASSESSMENT: The patient has been carried as a non-ischemic cardiomyopathy of unclear etiology. He has known LVEF of 20-25% and is s/p single chamber ICD. He seems to be well compensated on his current medical regimen including Entresto.   The patient is insistent that he was advised to have an exercise stress echo at [facility]. His DOE could be an anginal equivalent. Also will get a better idea of his functional status. Will order an exercise stress echo. If negative for coronary ischemia - we can reassure the patient and referring physician and look into other etiologies for the symptoms. If positive for coronary ischemia - then the patient will need a definitive cardiac cath to guide therapy (meds vs PCI vs CABG) if obstructive coronary artery disease is present.   The patient has been hard to follow as he has been sporadic in keeping his return visits. He has  a myriad of medical issues confounding his cardiovascular state.   Discussed with patient - he appears to understand and agree to the plan as outlined  PLAN: 1. Continue current medications: Current Outpatient Medications  Medication Sig Dispense Refill   aspirin 81 MG EC tablet *ANTIPLATELET* Take by mouth daily.   bictegravir-emtricitabine-tenofovir alafenamide (BIKTARVY) 50-200-25 mg per tablet Take 1 tablet by mouth daily. 90 tablet 3   eplerenone (INSPRA) 25 MG tablet TAKE 1/2 TABLET BY MOUTH EVERY DAY 45 tablet 3   fluticasone furoate-vilanteroL (BREO ELLIPTA) 100-25 mcg/dose inhaler Inhale 1 puff into the lungs daily.   hydrOXYzine pamoate (VISTARIL) 25 mg capsule Take 1 capsule (25 mg total) by mouth 2 (two) times daily as needed for Anxiety. 30 capsule 3   melatonin 10 mg capsule Take 10 mg by mouth at bedtime.   metoPROLOL succinate (TOPROL-XL) 25 MG 24 hr tablet Take 1 tablet (25 mg  total) by mouth daily. 90 tablet 3   multivit-min-FA-lycopen-lutein (MEN 50 PLUS MULTIVITAMIN) 300-600-300 mcg Tab Take by mouth.   nicotine (NICODERM CQ) 21 mg/24 hr Place 1 patch onto the skin daily. 28 patch 1   sacubitriL-valsartan (ENTRESTO) 24-26 mg per tablet Take 1 tablet by mouth 2 times daily.   sildenafiL (VIAGRA) 50 MG tablet TK 1 T PO QD 1 HOUR B SEXUAL ACTIVITY PRN   tamsulosin (FLOMAX) 0.4 mg Cap capsule TAKE 2 CAPSULES BY MOUTH EVERY DAY FOR BPH 180 capsule 3   traZODone (DESYREL) 100 MG tablet Take 1 tablet (100 mg total) by mouth nightly. 30 tablet 3   valACYclovir (VALTREX) 500 MG tablet Take 1 tablet (500 mg total) by mouth daily. 90 tablet 3   rivaroxaban (XARELTO) 20 mg tablet *ANTICOAGULANT* Take 1 tablet (20 mg total) by mouth daily with dinner. 30 tablet 11   No current facility-administered medications for this visit.   2. Follow a low carbohydrate, high protein, low glycemic diet.  3. Avoid excessive salt intake. 4. Try to get regular exercise at least 30 minutes (no more, no less) at least every other day.  5. Consistency of exercise is more important than duration or intensity. 6. Call 911 and go to the nearest emergency room for any episode of severe chest pain lasting longer than 15 minutes. 7. Give Cardiology a phone call [phone]) if you develop increased exertional chest pain or shortness of breath. 8. Have ordered an exercise stress echo to assess coronary flow and heart muscle function. Will be in touch with you about the results.  9. Regular follow-up visits with your primary care physician. 10. Keep appointment with Dr. Alfonso Ellis in December 11. Cardiology follow-up visit in 2 months.  The patient is subsequently seen cardiology again just a week ago.  Patient had his ICD interrogated and this showed he had episodes of ventricular fibrillation and nonsustained ventricular tachycardia which caused his device to firing August 25.  He has had no  further device firing since.  Note patient has not been using cocaine in the past year.  He has severe PTSD.  He is living in a shared housing situation with 3 other individuals all of whom are on crack cocaine.  Patient does note increased wheezing and productive cough of thick green mucus over the last several days.  Patient is compliant with all his medications.  His dose of Entresto was reduced.  Cardiology would like a repeat metabolic panel we will obtain this today.  Patient is maintained on aspirin, Biktarvy, Inspra, Breo, metoprolol,  potassium, tamsulosin, trazodone, Valtrex  Patient just received his COVID booster which is the [employer] booster and had a significant reaction to this.  He declines to receive the flu vaccine at this visit.  Patient is still smoking about a half a pack a day of cigarettes and does know the importance of reducing further.  Patient has a stress test coming up later in the week per cardiology he is encouraged to keep this appointment.  Due to his PTSD the patient does drink about 2  24 ounce bottles of beer about every other day  11/15/22 Patient returns to the clinic after a 1 year absence He had been seeing a primary [facility] [place] below is the documentation in September of this year.  The patient does follow with primary infectious disease physician for HIV and his numbers are good  09/11/22: Ryan Long is a 62 y.o. male who presents to the office today for reestablishing chronic care and complaints of a rash. States he's had a pruritic rash in the bilateral axillary region for the last 3 to 4 months. States he has tried over-the-counter hydrocortisone and athlete's foot medications without any relief. Denies any other associated symptoms. Medical history significant for chronic HIV infection, heart failure reduced ejection fraction with an ICD/pacemaker, CKD stage II, chronic bronchitis, current smoker with greater than  20-pack-year history, polycythemia, chronic neutropenia, vitamin D deficiency, and hyperlipidemia. Patient follows with ID for HIV management, currently on Biktarvy and prophylactic Valtrex. He has also been seen by cardiology most recently for complaints of syncopal episodes. States that he would sometimes experience lightheadedness for unknown causes. A Holter monitor was placed however he self removed due to subjective cost. He denies any palpitations, flutters, or strokelike symptoms. Last TTE was completed on 12/13/2021 showing an EF of 25 to 30%, with severe reduced systolic function, global hypokinesis of left ventricle, mild dilation of RV, LA, and RA, mild MR. Current relevant medications include metoprolol XL 50 mg, furosemide 20 mg, eplerenone 25 mg, and Entresto 24-26 mg. He has been seen by hematology in the past for polycythemia and chronic neutropenia, however does not regularly follow with anyone. He denies any headaches, chest pains, palpitations, dyspnea, pedal edema, orthopnea, or any recurrent infections. Born in [place], Mr. Gabin indicated that he moved around a lot as a child due to his father having been in the [employer]. The family settled in [place] after his father retired from [employer]. Recalling that his parents fought, he said they separated when he was about age 7. After the separation, he said, he had to fight off sexual advances from his mother. His parents divorced in about 63, and his father died two years later. His mother reportedly lives in [place]. The older of two children, he noted having a brother who works as a Curator in [place] whom he has not seen in over five years.  From 1981-82 Mr. Broman studied at [employer] in [place]. In August 2014 he enrolled in a business administration program at [employer] in [place] but he dropped out in December in the wake of being denied disability, losing his housing support, and being hospitalized for  depression. He appealed the disability decision and was awarded benefits in September 2015. Since receiving his benefits he has moved among [address], [place], and [place] living in rooming houses, motel rooms, or shelters while trying to find a stable residence.  Never married, Mr. Carlen said he dates  women and that his longest relationship lasted about one year. Not currently in a relationship, he indicated having no children. Describing himself as Marilynne Drivers, he indicated that he has attended church some in the past year or so. Saying that socially he mostly stays to himself, Mr. Keator said he likes watching sports, playing ping-pong or billiards, and going to movies.   1. HFrEF (heart failure with reduced ejection fraction) (HCC) 2. Simple chronic bronchitis (HCC) 3. Stage 2 chronic kidney disease 4. Polycythemia 5. Cigarette smoker 6. Rash 7. Mixed hyperlipidemia 8. Vitamin D deficiency 9. Erectile dysfunction due to diseases classified elsewhere  Plan Comprehensive pertinent labs including lipids and vitamin D ordered to be checked prior to next office visit. Most recent CBC revealed chronic polycythemia and chronic neutropenia with a ANC of 800. Referral was sent back to hematology for this reason. Given 7-day supply of fluconazole for suspected Candida of bilateral axilla. If symptoms do not resolve will refer to topical triamcinolone or mometasone due to CHF. After discussion patient was agreeable for referral lung cancer annual screening. Patient was advised to discuss with his cardiologist if he should be taking furosemide and eplerenone concomitantly. Patient endorses regular use of Breo Ellipta inhaler for COPD maintenance without any recurrences of oral thrush. Patient requests refill on sildenafil refill however his insurance does not cover the 50 mg or 100 mg dosages. Patient given 90-day supplies on other pertinent medications including rosuvastatin, Breo Ellipta, and  Toprol-XL.  Medications Prescribed Orders Placed This Encounter Medications  fluconazole (DIFLUCAN) 100 MG tablet Sig: Take 1 tablet (100 mg total) by mouth daily for 7 days. Dispense: 7 tablet Refill: 0  rosuvastatin (CRESTOR) 5 MG tablet Sig: Take 1 tablet (5 mg total) by mouth daily. Dispense: 90 tablet Refill: 0  BREO ELLIPTA 100-25 mcg/dose inhaler Sig: Inhale 1 puff into the lungs daily. Dispense: 60 each Refill: 2  metoPROLOL succinate (TOPROL-XL) 50 MG 24 hr tablet Sig: Take 1 tablet (50 mg total) by mouth daily. Dispense: 90 tablet Refill: 0  sildenafiL, pulm.hypertension, (REVATIO) 20 mg tablet Sig: Take 1 tablet (20 mg total) by mouth 3 times daily for 10 days. Dispense: 30 tablet Refill: 0  Other Orders Orders Placed This Encounter Procedures  CT LUNG SCREEN WO CONTRAST ANNUAL  Lipid Profile  25(OH) Vitamin D Total  Comprehensive metabolic panel  CBC And Differential  Ambulatory referral to Hematology / Oncology   The patient did get a lung CT scan screening study that was normal except for presence of aortic atherosclerosis.  And mild emphysema.  The patient's been coughing for 2 weeks productive of thick yellow mucus and wheezing he is on the [facility] inhaler.  He would benefit from follow-up chest x-ray.  He is smoking 12 to 13 cigarettes daily.  He also complains of numbness in the left arm and face that started 2 weeks ago and progressing.  He has never had carotid disease and a CT of the head done in 2016 was normal.  He needs refills on all his medications screening labs.  He declined a flu vaccine  03/27/23     Vision:Within last year Dental;  upcoming  Patient Active Problem List   Diagnosis Date Noted   Asthmatic bronchitis 01/24/2023   Rash and nonspecific skin eruption 12/19/2022   Arm numbness 11/15/2022   Aortic atherosclerosis 11/15/2022   Chronic kidney disease 08/08/2022   Healthcare maintenance 08/08/2022   Cigarette smoker  01/26/2022   ETD (Eustachian tube dysfunction), right 01/26/2022  Polycythemia 12/08/2021   Vitamin D deficiency 12/08/2021   Hip pain 01/06/2021   VT (ventricular tachycardia) 08/25/2020   HIV positive 07/12/2020   Atrial fibrillation 06/10/2020   BPH (benign prostatic hyperplasia) 01/28/2020   HFrEF (heart failure with reduced ejection fraction) 01/28/2020   Schizotypal disorder 08/29/2019   AICD (automatic cardioverter/defibrillator) present 08/15/2018   Abnormal EKG 06/28/2018   Adjustment disorder with mixed disturbance of emotions and conduct    NICM (nonischemic cardiomyopathy) 01/08/2018   Anxiety 12/20/2017   Bipolar 1 disorder 10/10/2017   Chronic systolic heart failure 05/13/2017   HSV-2 infection 07/17/2016   History of attempted suicide 04/14/2016   History of alcoholism 04/14/2016   History of substance abuse 04/14/2016   Constipation 01/17/2016   COPD exacerbation 10/04/2015   Abnormal thyroid stimulating hormone (TSH) level 10/04/2015   MDD (major depressive disorder), recurrent episode, severe 09/23/2015   Hypertension 09/10/2015   PTSD (post-traumatic stress disorder) 08/07/2015   Primary insomnia 04/02/2015   Herpesviral infection of penis 04/02/2015   Generalized anxiety disorder 05/29/2014   Tobacco use disorder 02/17/2013   Heart failure 09/19/2011   Other primary cardiomyopathies 08/24/2011   Human immunodeficiency virus (HIV) disease 02/01/2009   Recurrent HSV (herpes simplex virus) 02/01/2009   Hyperlipidemia 02/01/2009   Allergic rhinitis 02/01/2009   Past Medical History:  Diagnosis Date   Active smoker    AICD (automatic cardioverter/defibrillator) present    Alcohol abuse    Anxiety    AR (allergic rhinitis)    Bipolar 1 disorder    Cervical lymphadenitis 04/20/2021   CHF (congestive heart failure)    Chronic bronchitis    Chronic systolic heart failure    Controlled substance agreement signed 10/22/2018   COPD (chronic obstructive  pulmonary disease)    Cough 12/31/2018   Crack cocaine use    Depression    Genital herpes    HIV (human immunodeficiency virus infection) dx'd 08/1993   HLD (hyperlipidemia)    Hypertension    NICM (nonischemic cardiomyopathy)    Echocardiogram 06/28/11: EF 30-35%, mild MR, mild LAE;  No CAD by coronary CT angiogram 3/12 at [facility]   NSVT (nonsustained ventricular tachycardia)    PTSD (post-traumatic stress disorder)    Past Surgical History:  Procedure Laterality Date   CARDIAC DEFIBRILLATOR PLACEMENT  01/08/2018   ICD IMPLANT N/A 01/08/2018   Procedure: ICD IMPLANT;  Surgeon: Regan Lemming, MD;  Location: [facility];  Service: Cardiovascular;  Laterality: N/A;   Social History   Tobacco Use   Smoking status: Every Day    Packs/day: 0.75    Years: 38.00    Additional pack years: 0.00    Total pack years: 28.50    Types: Cigarettes   Smokeless tobacco: Never   Tobacco comments:    hoping welbutrin will help  Vaping Use   Vaping Use: Never used  Substance Use Topics   Alcohol use: Not Currently    Alcohol/week: 6.0 standard drinks of alcohol    Types: 6 Cans of beer per week    Comment: 01/08/2018 "stopped 06/2017"   Drug use: Not Currently    Types: Cocaine    Comment: 01/08/2018 "stopped 06/2017"   Social History   Socioeconomic History   Marital status: Single    Spouse name: Not on file   Number of children: Not on file   Years of education: Not on file   Highest education level: GED or equivalent  Occupational History   Not  on file  Tobacco Use   Smoking status: Every Day    Packs/day: 0.75    Years: 38.00    Additional pack years: 0.00    Total pack years: 28.50    Types: Cigarettes   Smokeless tobacco: Never   Tobacco comments:    hoping welbutrin will help  Vaping Use   Vaping Use: Never used  Substance and Sexual Activity   Alcohol use: Not Currently    Alcohol/week: 6.0 standard drinks of alcohol    Types: 6 Cans  of beer per week    Comment: 01/08/2018 "stopped 06/2017"   Drug use: Not Currently    Types: Cocaine    Comment: 01/08/2018 "stopped 06/2017"   Sexual activity: Yes    Birth control/protection: Condom    Comment: pt. declined condoms  Other Topics Concern   Not on file  Social History Narrative   Not on file   Social Determinants of Health   Financial Resource Strain: Low Risk  (03/23/2023)   Overall Financial Resource Strain (CARDIA)    Difficulty of Paying Living Expenses: Not hard at all  Food Insecurity: No Food Insecurity (03/23/2023)   Hunger Vital Sign    Worried About Running Out of Food in the Last Year: Never true    Ran Out of Food in the Last Year: Never true  Transportation Needs: No Transportation Needs (03/23/2023)   PRAPARE - Administrator, Civil Service (Medical): No    Lack of Transportation (Non-Medical): No  Physical Activity: Insufficiently Active (03/23/2023)   Exercise Vital Sign    Days of Exercise per Week: 7 days    Minutes of Exercise per Session: 10 min  Stress: Stress Concern Present (03/23/2023)   [employer] of Occupational Health - Occupational Stress Questionnaire    Feeling of Stress : To some extent  Social Connections: Moderately Isolated (03/23/2023)   Social Connection and Isolation Panel [NHANES]    Frequency of Communication with Friends and Family: Never    Frequency of Social Gatherings with Friends and Family: Never    Attends Religious Services: 1 to 4 times per year    Active Member of [employer] or Organizations: No    Attends Engineer, structural: More than 4 times per year    Marital Status: Never married  Catering manager Violence: Not on file   Family Status  Relation Name Status   Mother  Alive   Father  Deceased at age 10   Brother  (Not Specified)   Brother  (Not Specified)   Family History  Problem Relation Age of Onset   Glaucoma Mother    Mental illness Mother    Vision loss Mother    Hypertension  Father    CAD Father    Mental illness Father    Alcohol abuse Father    Drug abuse Father    Heart attack Father 35   Early death Father    Alcohol abuse Brother    Drug abuse Brother    Drug abuse Brother    No Known Allergies    Medications: Outpatient Medications Prior to Visit  Medication Sig   olopatadine (PATANOL) 0.1 % ophthalmic solution Place 1 drop into both eyes 2 (two) times daily.   aspirin EC 81 MG tablet Take 81 mg by mouth daily. Swallow whole.   azelastine (ASTELIN) 0.1 % nasal spray Place 2 sprays into both nostrils 2 (two) times daily. Use in each nostril as directed   bictegravir-emtricitabine-tenofovir  AF (BIKTARVY) 50-200-25 MG TABS tablet Take 1 tablet by mouth daily.   empagliflozin (JARDIANCE) 10 MG TABS tablet Take 1 tablet (10 mg total) by mouth daily before breakfast.   eplerenone (INSPRA) 25 MG tablet TAKE 0.5 TABLET BY MOUTH EVERY DAY   finasteride (PROSCAR) 5 MG tablet TAKE 1 TABLET(5 MG) BY MOUTH DAILY   fluticasone (FLONASE) 50 MCG/ACT nasal spray Place 2 sprays into both nostrils daily.   fluticasone furoate-vilanterol (BREO ELLIPTA) 100-25 MCG/ACT AEPB Inhale 1 puff into the lungs daily.   furosemide (LASIX) 20 MG tablet Take 1 tablet (20 mg total) by mouth as needed.   hydrOXYzine (ATARAX) 25 MG tablet Take 1-2 tablets (25-50 mg total) by mouth 2 (two) times daily as needed.   Melatonin 10 MG CAPS Take 10 mg by mouth at bedtime as needed (sleep).   metoprolol succinate (TOPROL-XL) 50 MG 24 hr tablet Take 1 tablet (50 mg total) by mouth daily. For high blood pressure   Multiple Vitamins-Minerals (MENS MULTIPLUS PO) Take 1 tablet by mouth daily.   nicotine (NICODERM CQ - DOSED IN MG/24 HOURS) 21 mg/24hr patch  (Patient not taking: Reported on 12/19/2022)   potassium chloride SA (KLOR-CON M) 20 MEQ tablet Take 1 tablet (20 mEq total) by mouth daily.   predniSONE (DELTASONE) 10 MG tablet Take 4 tablets daily for 5 days then stop   rosuvastatin  (CRESTOR) 5 MG tablet Take 1 tablet (5 mg total) by mouth daily.   sacubitril-valsartan (ENTRESTO) 24-26 MG Take 1 tablet by mouth 2 (two) times daily.   sildenafil (VIAGRA) 50 MG tablet Take 1 tablet (50 mg total) by mouth as needed for erectile dysfunction.   tamsulosin (FLOMAX) 0.4 MG CAPS capsule TAKE 2 CAPSULES(0.8 MG) BY MOUTH DAILY AFTER SUPPER   traZODone (DESYREL) 100 MG tablet Take 100 mg by mouth at bedtime as needed.   triamcinolone cream (KENALOG) 0.1 % Apply 1 Application topically 2 (two) times daily.   valACYclovir (VALTREX) 500 MG tablet Take 1 tablet (500 mg total) by mouth daily.   Varenicline Tartrate, Starter, 0.5 MG X 11 & 1 MG X 42 TBPK Take one 0.5 mg tab by mouth once daily for 3 days, increase to one 0.5 mg tab twice daily for 4 days, increase to 1 mg tab twice daily. (Patient not taking: Reported on 12/01/2022)   No facility-administered medications prior to visit.    No Known Allergies  Patient Care Team: Storm Frisk, MD as PCP - General (Pulmonary Disease) Regan Lemming, MD as PCP - Electrophysiology (Cardiology) Regan Lemming, MD as Consulting Physician (Cardiology)  Review of Systems  Constitutional:  Negative for chills, diaphoresis, fever, malaise/fatigue and weight loss.  HENT:  Negative for congestion, hearing loss, nosebleeds, sore throat and tinnitus.   Eyes:  Negative for blurred vision, photophobia and redness.  Respiratory:  Positive for cough, sputum production, shortness of breath and wheezing. Negative for hemoptysis and stridor.   Cardiovascular:  Negative for chest pain, palpitations, orthopnea, claudication, leg swelling and PND.  Gastrointestinal:  Negative for abdominal pain, blood in stool, constipation, diarrhea, heartburn, nausea and vomiting.  Genitourinary:  Negative for dysuria, flank pain, frequency, hematuria and urgency.  Musculoskeletal:  Negative for back pain, falls, joint pain, myalgias and neck pain.  Skin:   Negative for itching and rash.  Neurological:  Negative for dizziness, tingling, tremors, sensory change, speech change, focal weakness, seizures, loss of consciousness, weakness and headaches.  Endo/Heme/Allergies:  Negative for environmental allergies and  polydipsia. Does not bruise/bleed easily.  Psychiatric/Behavioral:  Negative for depression, memory loss, substance abuse and suicidal ideas. The patient is not nervous/anxious and does not have insomnia.         Objective  There were no vitals taken for this visit. BP Readings from Last 3 Encounters:  01/04/23 103/78  12/19/22 105/76  12/01/22 108/70   Wt Readings from Last 3 Encounters:  12/19/22 203 lb (92.1 kg)  12/01/22 202 lb (91.6 kg)  11/15/22 202 lb (91.6 kg)      Physical Exam Vitals reviewed.  Constitutional:      Appearance: Normal appearance. He is well-developed. He is not diaphoretic.  HENT:     Head: Normocephalic and atraumatic.     Nose: Congestion and rhinorrhea present. No nasal deformity, septal deviation or mucosal edema.     Right Sinus: No maxillary sinus tenderness or frontal sinus tenderness.     Left Sinus: No maxillary sinus tenderness or frontal sinus tenderness.     Mouth/Throat:     Mouth: Mucous membranes are moist.     Pharynx: Oropharynx is clear. No oropharyngeal exudate.  Eyes:     General: No scleral icterus.    Conjunctiva/sclera: Conjunctivae normal.     Pupils: Pupils are equal, round, and reactive to light.  Neck:     Thyroid: No thyromegaly.     Vascular: No carotid bruit or JVD.     Trachea: Trachea normal. No tracheal tenderness or tracheal deviation.  Cardiovascular:     Rate and Rhythm: Normal rate and regular rhythm.     Chest Wall: PMI is not displaced.     Pulses: Normal pulses. No decreased pulses.     Heart sounds: Normal heart sounds, S1 normal and S2 normal. Heart sounds not distant. No murmur heard.    No systolic murmur is present.     No diastolic murmur is  present.     No friction rub. No gallop. No S3 or S4 sounds.  Pulmonary:     Effort: Pulmonary effort is normal. No tachypnea, accessory muscle usage or respiratory distress.     Breath sounds: No stridor. Wheezing, rhonchi and rales present. No decreased breath sounds.  Chest:     Chest wall: No tenderness.  Abdominal:     General: Bowel sounds are normal. There is no distension.     Palpations: Abdomen is soft. Abdomen is not rigid.     Tenderness: There is no abdominal tenderness. There is no guarding or rebound.  Musculoskeletal:        General: Normal range of motion.     Cervical back: Normal range of motion and neck supple. No edema, erythema or rigidity. No muscular tenderness. Normal range of motion.  Lymphadenopathy:     Head:     Right side of head: No submental or submandibular adenopathy.     Left side of head: No submental or submandibular adenopathy.     Cervical: No cervical adenopathy.  Skin:    General: Skin is warm and dry.     Coloration: Skin is not pale.     Findings: No rash.     Nails: There is no clubbing.  Neurological:     Mental Status: He is alert and oriented to person, place, and time.     Sensory: No sensory deficit.  Psychiatric:        Mood and Affect: Mood normal.        Speech: Speech normal.  Behavior: Behavior normal.        Thought Content: Thought content normal.        Judgment: Judgment normal.       Most recent functional status assessment:    11/15/2022   12:34 PM  In your present state of health, do you have any difficulty performing the following activities:  Hearing? 0  Vision? 0  Difficulty concentrating or making decisions? 0  Walking or climbing stairs? 0  Dressing or bathing? 0  Doing errands, shopping? 0  Preparing Food and eating ? N  Using the Toilet? N  In the past six months, have you accidently leaked urine? N  Do you have problems with loss of bowel control? N  Managing your Medications? N  Managing  your Finances? N  Housekeeping or managing your Housekeeping? N   Most recent fall risk assessment:    12/19/2022   10:34 AM  Fall Risk   Falls in the past year? 0  Number falls in past yr: 0  Injury with Fall? 0  Risk for fall due to : No Fall Risks  Follow up Falls evaluation completed    Most recent depression screenings:    12/19/2022   10:34 AM 08/08/2022    8:41 AM  PHQ 2/9 Scores  PHQ - 2 Score 0 0   Most recent cognitive screening:    11/15/2022   12:38 PM  6CIT Screen  What Year? 0 points  What month? 0 points  What time? 0 points  Count back from 20 0 points  Months in reverse 0 points   Most recent Audit-C alcohol use screening    03/23/2023    8:26 PM  Alcohol Use Disorder Test (AUDIT)  1. How often do you have a drink containing alcohol? 0  3. How often do you have six or more drinks on one occasion? 0   A score of 3 or more in women, and 4 or more in men indicates increased risk for alcohol abuse, EXCEPT if all of the points are from question 1   Vision/Hearing Screen: No results found.  Last CBC Lab Results  Component Value Date   WBC 3.0 (L) 11/15/2022   HGB 18.8 (H) 11/15/2022   HCT 53.6 (H) 11/15/2022   MCV 97 11/15/2022   MCH 33.9 (H) 11/15/2022   RDW 13.8 11/15/2022   PLT 218 11/15/2022   Last metabolic panel Lab Results  Component Value Date   GLUCOSE 86 12/19/2022   NA 142 12/19/2022   K 4.1 12/19/2022   CL 109 12/19/2022   CO2 25 12/19/2022   BUN 12 12/19/2022   CREATININE 1.30 12/19/2022   EGFR 63 12/19/2022   CALCIUM 9.0 12/19/2022   PROT 6.4 12/19/2022   ALBUMIN 3.9 09/06/2021   LABGLOB 1.5 09/06/2021   AGRATIO 2.6 (H) 09/06/2021   BILITOT 1.1 12/19/2022   ALKPHOS 50 09/06/2021   AST 26 12/19/2022   ALT 31 12/19/2022   ANIONGAP 10 09/26/2021   Last lipids Lab Results  Component Value Date   CHOL 162 03/10/2021   HDL 49 03/10/2021   LDLCALC 96 03/10/2021   TRIG 76 03/10/2021   CHOLHDL 3.3 03/10/2021   Last  thyroid functions Lab Results  Component Value Date   TSH 0.217 (L) 09/06/2021   T3TOTAL 134 08/09/2015   Last vitamin D No results found for: "25OHVITD2", "25OHVITD3", "VD25OH" Last vitamin B12 and Folate No results found for: "VITAMINB12", "FOLATE"    No results found for  any visits on 03/27/23.    Assessment & Plan   Annual wellness visit done today including the all of the following: Reviewed patient's Family Medical History Reviewed and updated list of patient's medical providers Assessment of cognitive impairment was done Assessed patient's functional ability Established a written schedule for health screening services Health Risk Assessent Completed and Reviewed  Exercise Activities and Dietary recommendations  Goals   None    Immunization History  Administered Date(s) Administered   Hepatitis B 08/24/2011, 09/25/2011   Hepatitis B, PED/ADOLESCENT 08/24/2011, 09/25/2011   Influenza Split 09/25/2011, 09/04/2018, 10/29/2019   Influenza Whole 09/20/2009, 08/24/2011   Influenza,inj,Quad PF,6+ Mos 12/22/2013, 08/16/2015, 11/17/2016, 09/12/2017   Influenza-Unspecified 11/17/2008, 09/29/2010, 09/25/2011, 09/16/2012, 10/16/2013, 12/22/2013, 08/16/2015, 11/17/2016, 09/12/2017, 10/18/2017, 09/04/2018, 08/15/2019, 10/29/2019   Janssen (J&J) SARS-COV-2 Vaccination 03/05/2020   Moderna SARS-COV2 Booster Vaccination 02/02/2021   Moderna Sars-Covid-2 Vaccination 02/02/2021, 03/16/2021   PFIZER Comirnaty(Gray Top)Covid-19 Tri-Sucrose Vaccine 09/29/2020, 10/10/2020, 09/04/2021   PFIZER(Purple Top)SARS-COV-2 Vaccination 09/29/2020, 09/04/2021   PNEUMOCOCCAL CONJUGATE-20 05/23/2021   PPD Test 11/19/2007, 11/01/2010   Pfizer Covid-19 Vaccine Bivalent Booster 24yrs & up 09/02/2021   Pneumococcal Conjugate-13 02/10/2014   Pneumococcal Polysaccharide-23 05/23/2004, 10/01/2009, 01/17/2016   Tdap 04/20/1994, 02/10/2014, 06/07/2021   Zoster Recombinat (Shingrix) 06/13/2018, 08/13/2018    Zoster, Live 08/13/2018   Zoster, Unspecified 06/13/2018, 08/13/2018    Health Maintenance  Topic Date Due   COVID-19 Vaccine (8 - 2023-24 season) 08/18/2022   INFLUENZA VACCINE  07/19/2023   Lung Cancer Screening  10/12/2023   Medicare Annual Wellness (AWV)  11/16/2023   Fecal DNA (Cologuard)  03/30/2024   DTaP/Tdap/Td (4 - Td or Tdap) 06/08/2031   Hepatitis C Screening  Completed   HIV Screening  Completed   Zoster Vaccines- Shingrix  Completed   HPV VACCINES  Aged Out     Discussed health benefits of physical activity, and encouraged him to engage in regular exercise appropriate for his age and condition.    Problem List Items Addressed This Visit   None  No follow-ups on file.     Shan Levans, MD

## 2023-03-26 ENCOUNTER — Ambulatory Visit: Payer: 59 | Attending: Cardiology | Admitting: Cardiology

## 2023-03-27 ENCOUNTER — Ambulatory Visit: Payer: 59 | Admitting: Critical Care Medicine

## 2023-03-27 ENCOUNTER — Encounter: Payer: Self-pay | Admitting: Cardiology

## 2023-03-29 NOTE — Progress Notes (Deleted)
New Patient Note  RE: Ryan Long MRN: [id number] DOB: 1961-12-07 Date of Office Visit: 03/30/2023  Consult requested by: Storm Frisk, MD Primary care provider: Storm Frisk, MD  Chief Complaint: No chief complaint on file.  History of Present Illness: I had the pleasure of seeing Ryan Long for initial evaluation at the [facility] on 03/29/2023. He is a 62 y.o. male, who is referred here by Storm Frisk, MD for the evaluation of allergic rhinitis.  He reports symptoms of ***. Symptoms have been going on for *** years. The symptoms are present *** all year around with worsening in ***. Other triggers include exposure to ***. Anosmia: ***. Headache: ***. He has used *** with ***fair improvement in symptoms. Sinus infections: ***. Previous work up includes: ***. Previous ENT evaluation: ***. Previous sinus imaging: ***. History of nasal polyps: ***. Last eye exam: ***. History of reflux: ***.  Referral note: "Severe mold allergy mold report in home severe aspergillous in home "  Assessment and Plan: [name] is a 62 y.o. male with: No problem-specific Assessment & Plan notes found for this encounter.  No follow-ups on file.  No orders of the defined types were placed in this encounter.  Lab Orders  No laboratory test(s) ordered today    Other allergy screening: Asthma: {Blank single:19197::"yes","no"} Rhino conjunctivitis: {Blank single:19197::"yes","no"} Food allergy: {Blank single:19197::"yes","no"} Medication allergy: {Blank single:19197::"yes","no"} Hymenoptera allergy: {Blank single:19197::"yes","no"} Urticaria: {Blank single:19197::"yes","no"} Eczema:{Blank single:19197::"yes","no"} History of recurrent infections suggestive of immunodeficency: {Blank single:19197::"yes","no"}  Diagnostics: Spirometry:  Tracings reviewed. His effort: {Blank single:19197::"Good reproducible efforts.","It was hard to get consistent  efforts and there is a question as to whether this reflects a maximal maneuver.","Poor effort, data can not be interpreted."} FVC: ***L FEV1: ***L, ***% predicted FEV1/FVC ratio: ***% Interpretation: {Blank single:19197::"Spirometry consistent with mild obstructive disease","Spirometry consistent with moderate obstructive disease","Spirometry consistent with severe obstructive disease","Spirometry consistent with possible restrictive disease","Spirometry consistent with mixed obstructive and restrictive disease","Spirometry uninterpretable due to technique","Spirometry consistent with normal pattern","No overt abnormalities noted given today's efforts"}.  Please see scanned spirometry results for details.  Skin Testing: Unable to skin test today as [facility] won't allow new patient visits and procedures on the same day.   Results discussed with patient/family.   Past Medical History: Patient Active Problem List   Diagnosis Date Noted   Asthmatic bronchitis 01/24/2023   Rash and nonspecific skin eruption 12/19/2022   Arm numbness 11/15/2022   Aortic atherosclerosis 11/15/2022   Chronic kidney disease 08/08/2022   Healthcare maintenance 08/08/2022   Cigarette smoker 01/26/2022   ETD (Eustachian tube dysfunction), right 01/26/2022   Allergy to pollen 01/26/2022   Polycythemia 12/08/2021   Vitamin D deficiency 12/08/2021   Hip pain 01/06/2021   VT (ventricular tachycardia) 08/25/2020   HIV positive 07/12/2020   Atrial fibrillation 06/10/2020   BPH (benign prostatic hyperplasia) 01/28/2020   HFrEF (heart failure with reduced ejection fraction) 01/28/2020   Schizotypal disorder 08/29/2019   Achilles tendinitis, left leg 04/03/2019   AICD (automatic cardioverter/defibrillator) present 08/15/2018   Abnormal EKG 06/28/2018   Adjustment disorder with mixed disturbance of emotions and conduct    NICM (nonischemic cardiomyopathy) 01/08/2018   Anxiety 12/20/2017   Bipolar 1 disorder 10/10/2017    Chronic systolic heart failure 05/13/2017   HSV-2 infection 07/17/2016   History of attempted suicide 04/14/2016   History of alcoholism 04/14/2016   History of substance abuse 04/14/2016   Constipation 01/17/2016   COPD exacerbation 10/04/2015  Abnormal thyroid stimulating hormone (TSH) level 10/04/2015   MDD (major depressive disorder), recurrent episode, severe 09/23/2015   Hypertension 09/10/2015   PTSD (post-traumatic stress disorder) 08/07/2015   Primary insomnia 04/02/2015   Herpesviral infection of penis 04/02/2015   Generalized anxiety disorder 05/29/2014   Tobacco use disorder 02/17/2013   Heart failure 09/19/2011   Other primary cardiomyopathies 08/24/2011   Human immunodeficiency virus (HIV) disease 02/01/2009   Recurrent HSV (herpes simplex virus) 02/01/2009   Hyperlipidemia 02/01/2009   Allergic rhinitis 02/01/2009   Past Medical History:  Diagnosis Date   Active smoker    AICD (automatic cardioverter/defibrillator) present    Alcohol abuse    Anxiety    AR (allergic rhinitis)    Bipolar 1 disorder    Cervical lymphadenitis 04/20/2021   CHF (congestive heart failure)    Chronic bronchitis    Chronic systolic heart failure    Controlled substance agreement signed 10/22/2018   COPD (chronic obstructive pulmonary disease)    Cough 12/31/2018   Crack cocaine use    Depression    Genital herpes    HIV (human immunodeficiency virus infection) dx'd 08/1993   HLD (hyperlipidemia)    Hypertension    NICM (nonischemic cardiomyopathy)    Echocardiogram 06/28/11: EF 30-35%, mild MR, mild LAE;  No CAD by coronary [facility]   NSVT (nonsustained ventricular tachycardia)    PTSD (post-traumatic stress disorder)    Past Surgical History: Past Surgical History:  Procedure Laterality Date   CARDIAC DEFIBRILLATOR PLACEMENT  01/08/2018   ICD IMPLANT N/A 01/08/2018   Procedure: ICD IMPLANT;  Surgeon: Regan Lemmingamnitz, Will Martin, MD;  Location: [facility]  INVASIVE CV LAB;  Service: Cardiovascular;  Laterality: N/A;   Medication List:  Current Outpatient Medications  Medication Sig Dispense Refill   olopatadine (PATANOL) 0.1 % ophthalmic solution Place 1 drop into both eyes 2 (two) times daily. 5 mL 12   aspirin EC 81 MG tablet Take 81 mg by mouth daily. Swallow whole.     azelastine (ASTELIN) 0.1 % nasal spray Place 2 sprays into both nostrils 2 (two) times daily. Use in each nostril as directed 30 mL 12   bictegravir-emtricitabine-tenofovir AF (BIKTARVY) 50-200-25 MG TABS tablet Take 1 tablet by mouth daily. 30 tablet 5   empagliflozin (JARDIANCE) 10 MG TABS tablet Take 1 tablet (10 mg total) by mouth daily before breakfast. 30 tablet 11   eplerenone (INSPRA) 25 MG tablet TAKE 0.5 TABLET BY MOUTH EVERY DAY 45 tablet 6   finasteride (PROSCAR) 5 MG tablet TAKE 1 TABLET(5 MG) BY MOUTH DAILY 30 tablet 0   fluticasone (FLONASE) 50 MCG/ACT nasal spray Place 2 sprays into both nostrils daily. 16 g 6   fluticasone furoate-vilanterol (BREO ELLIPTA) 100-25 MCG/ACT AEPB Inhale 1 puff into the lungs daily. 60 each 11   hydrOXYzine (ATARAX) 25 MG tablet Take 1-2 tablets (25-50 mg total) by mouth 2 (two) times daily as needed. 30 tablet 5   Melatonin 10 MG CAPS Take 10 mg by mouth at bedtime as needed (sleep).     metoprolol succinate (TOPROL-XL) 50 MG 24 hr tablet Take 1 tablet (50 mg total) by mouth daily. For high blood pressure 90 tablet 3   Multiple Vitamins-Minerals (MENS MULTIPLUS PO) Take 1 tablet by mouth daily.     nicotine (NICODERM CQ - DOSED IN MG/24 HOURS) 21 mg/24hr patch  (Patient not taking: Reported on 12/19/2022)     potassium chloride SA (KLOR-CON M) 20 MEQ tablet  Take 1 tablet (20 mEq total) by mouth daily. 90 tablet 3   predniSONE (DELTASONE) 10 MG tablet Take 4 tablets daily for 5 days then stop 20 tablet 0   rosuvastatin (CRESTOR) 5 MG tablet Take 1 tablet (5 mg total) by mouth daily. 90 tablet 2   sacubitril-valsartan (ENTRESTO) 24-26  MG Take 1 tablet by mouth 2 (two) times daily. 60 tablet 1   sildenafil (VIAGRA) 50 MG tablet Take 1 tablet (50 mg total) by mouth as needed for erectile dysfunction. 10 tablet 3   tamsulosin (FLOMAX) 0.4 MG CAPS capsule TAKE 2 CAPSULES(0.8 MG) BY MOUTH DAILY AFTER SUPPER 60 capsule 5   traZODone (DESYREL) 100 MG tablet Take 100 mg by mouth at bedtime as needed.     triamcinolone cream (KENALOG) 0.1 % Apply 1 Application topically 2 (two) times daily. 30 g 0   valACYclovir (VALTREX) 500 MG tablet Take 1 tablet (500 mg total) by mouth daily. 60 tablet 4   Varenicline Tartrate, Starter, 0.5 MG X 11 & 1 MG X 42 TBPK Take one 0.5 mg tab by mouth once daily for 3 days, increase to one 0.5 mg tab twice daily for 4 days, increase to 1 mg tab twice daily. (Patient not taking: Reported on 12/01/2022)     No current facility-administered medications for this visit.   Allergies: No Known Allergies Social History: Social History   Socioeconomic History   Marital status: Single    Spouse name: Not on file   Number of children: Not on file   Years of education: Not on file   Highest education level: GED or equivalent  Occupational History   Not on file  Tobacco Use   Smoking status: Every Day    Packs/day: 0.75    Years: 38.00    Additional pack years: 0.00    Total pack years: 28.50    Types: Cigarettes   Smokeless tobacco: Never   Tobacco comments:    hoping welbutrin will help  Vaping Use   Vaping Use: Never used  Substance and Sexual Activity   Alcohol use: Not Currently    Alcohol/week: 6.0 standard drinks of alcohol    Types: 6 Cans of beer per week    Comment: 01/08/2018 "stopped 06/2017"   Drug use: Not Currently    Types: Cocaine    Comment: 01/08/2018 "stopped 06/2017"   Sexual activity: Yes    Birth control/protection: Condom    Comment: pt. declined condoms  Other Topics Concern   Not on file  Social History Narrative   Not on file   Social Determinants of Health    Financial Resource Strain: Low Risk  (03/23/2023)   Overall Financial Resource Strain (CARDIA)    Difficulty of Paying Living Expenses: Not hard at all  Food Insecurity: No Food Insecurity (03/23/2023)   Hunger Vital Sign    Worried About Running Out of Food in the Last Year: Never true    Ran Out of Food in the Last Year: Never true  Transportation Needs: No Transportation Needs (03/23/2023)   PRAPARE - Administrator, Civil Service (Medical): No    Lack of Transportation (Non-Medical): No  Physical Activity: Insufficiently Active (03/23/2023)   Exercise Vital Sign    Days of Exercise per Week: 7 days    Minutes of Exercise per Session: 10 min  Stress: Stress Concern Present (03/23/2023)   [employer] of Occupational Health - Occupational Stress Questionnaire    Feeling of Stress :  To some extent  Social Connections: Moderately Isolated (03/23/2023)   Social Connection and Isolation Panel [NHANES]    Frequency of Communication with Friends and Family: Never    Frequency of Social Gatherings with Friends and Family: Never    Attends Religious Services: 1 to 4 times per year    Active Member of [employer] or Organizations: No    Attends Engineer, structural: More than 4 times per year    Marital Status: Never married   Lives in a ***. Smoking: *** Occupation: ***  Environmental HistorySurveyor, minerals in the house: Copywriter, advertising in the family room: {Blank single:19197::"yes","no"} Carpet in the bedroom: {Blank single:19197::"yes","no"} Heating: {Blank single:19197::"electric","gas","heat pump"} Cooling: {Blank single:19197::"central","window","heat pump"} Pet: {Blank single:19197::"yes ***","no"}  Family History: Family History  Problem Relation Age of Onset   Glaucoma Mother    Mental illness Mother    Vision loss Mother    Hypertension Father    CAD Father    Mental illness Father    Alcohol abuse Father    Drug abuse  Father    Heart attack Father 46   Early death Father    Alcohol abuse Brother    Drug abuse Brother    Drug abuse Brother    Problem                               Relation Asthma                                   *** Eczema                                *** Food allergy                          *** Allergic rhino conjunctivitis     ***  Review of Systems  Constitutional:  Negative for appetite change, chills, fever and unexpected weight change.  HENT:  Negative for congestion and rhinorrhea.   Eyes:  Negative for itching.  Respiratory:  Negative for cough, chest tightness, shortness of breath and wheezing.   Cardiovascular:  Negative for chest pain.  Gastrointestinal:  Negative for abdominal pain.  Genitourinary:  Negative for difficulty urinating.  Skin:  Negative for rash.  Neurological:  Negative for headaches.    Objective: There were no vitals taken for this visit. There is no height or weight on file to calculate BMI. Physical Exam Vitals and nursing note reviewed.  Constitutional:      Appearance: Normal appearance. He is well-developed.  HENT:     Head: Normocephalic and atraumatic.     Right Ear: Tympanic membrane and external ear normal.     Left Ear: Tympanic membrane and external ear normal.     Nose: Nose normal.     Mouth/Throat:     Mouth: Mucous membranes are moist.     Pharynx: Oropharynx is clear.  Eyes:     Conjunctiva/sclera: Conjunctivae normal.  Cardiovascular:     Rate and Rhythm: Normal rate and regular rhythm.     Heart sounds: Normal heart sounds. No murmur heard.    No friction rub. No gallop.  Pulmonary:     Effort: Pulmonary effort is normal.     Breath sounds: Normal  breath sounds. No wheezing, rhonchi or rales.  Musculoskeletal:     Cervical back: Neck supple.  Skin:    General: Skin is warm.     Findings: No rash.  Neurological:     Mental Status: He is alert and oriented to person, place, and time.  Psychiatric:         Behavior: Behavior normal.    The plan was reviewed with the patient/family, and all questions/concerned were addressed.  It was my pleasure to see Aldin today and participate in his care. Please feel free to contact me with any questions or concerns.  Sincerely,  Wyline Mood, DO Allergy & Immunology  [facility] office: [phone] [facility] office: [phone]

## 2023-03-30 ENCOUNTER — Ambulatory Visit: Payer: 59 | Admitting: Allergy

## 2023-04-05 ENCOUNTER — Other Ambulatory Visit: Payer: Self-pay | Admitting: Family

## 2023-04-05 NOTE — Telephone Encounter (Signed)
Okay to refill? 

## 2023-04-10 DIAGNOSIS — Z9109 Other allergy status, other than to drugs and biological substances: Secondary | ICD-10-CM | POA: Diagnosis not present

## 2023-04-10 DIAGNOSIS — Z77120 Contact with and (suspected) exposure to mold (toxic): Secondary | ICD-10-CM | POA: Diagnosis not present

## 2023-04-10 DIAGNOSIS — R21 Rash and other nonspecific skin eruption: Secondary | ICD-10-CM | POA: Diagnosis not present

## 2023-04-10 DIAGNOSIS — I499 Cardiac arrhythmia, unspecified: Secondary | ICD-10-CM | POA: Diagnosis not present

## 2023-04-11 DIAGNOSIS — Z0389 Encounter for observation for other suspected diseases and conditions ruled out: Secondary | ICD-10-CM | POA: Diagnosis not present

## 2023-04-11 DIAGNOSIS — Z77120 Contact with and (suspected) exposure to mold (toxic): Secondary | ICD-10-CM | POA: Diagnosis not present

## 2023-04-16 DIAGNOSIS — I480 Paroxysmal atrial fibrillation: Secondary | ICD-10-CM | POA: Diagnosis not present

## 2023-04-16 DIAGNOSIS — I4729 Other ventricular tachycardia: Secondary | ICD-10-CM | POA: Diagnosis not present

## 2023-04-30 ENCOUNTER — Ambulatory Visit: Payer: Medicare Other | Admitting: Family

## 2023-05-16 ENCOUNTER — Encounter (HOSPITAL_COMMUNITY): Payer: 59 | Admitting: Internal Medicine

## 2023-05-16 ENCOUNTER — Other Ambulatory Visit (HOSPITAL_COMMUNITY): Payer: 59

## 2023-05-27 ENCOUNTER — Other Ambulatory Visit: Payer: Self-pay | Admitting: Critical Care Medicine

## 2023-05-28 ENCOUNTER — Other Ambulatory Visit: Payer: Self-pay | Admitting: Critical Care Medicine

## 2023-06-13 ENCOUNTER — Other Ambulatory Visit: Payer: Self-pay | Admitting: Critical Care Medicine

## 2023-07-08 ENCOUNTER — Other Ambulatory Visit: Payer: Self-pay | Admitting: Critical Care Medicine

## 2023-07-09 NOTE — Telephone Encounter (Signed)
Requested medication (s) are due for refill today: yes  Requested medication (s) are on the active medication list: yes  Last refill:  05/28/23  Future visit scheduled: no  Notes to clinic:  Unable to refill per protocol, courtesy refill already given, routing for provider approval.      Requested Prescriptions  Pending Prescriptions Disp Refills   traZODone (DESYREL) 100 MG tablet [Pharmacy Med Name: TRAZODONE 100MG  TABLETS] 30 tablet 0    Sig: TAKE 1 TABLET BY MOUTH AT BEDTIME AS NEEDED FOR SLEEP     Psychiatry: Antidepressants - Serotonin Modulator Failed - 07/08/2023 12:08 AM      Failed - Valid encounter within last 6 months    Recent Outpatient Visits           7 months ago COPD exacerbation [facility])   [place] Storm Frisk, MD   1 year ago Encounter for Medicare annual wellness exam   [place] [facility] Storm Frisk, MD   1 year ago Chronic obstructive pulmonary disease, unspecified COPD type [facility])   [place] [facility] Storm Frisk, MD   2 years ago Pneumococcal vaccine administered   [facility] Storm Frisk, MD   2 years ago Cervical lymphadenitis   [place] [facility] Storm Frisk, MD              Passed - Completed PHQ-2 or PHQ-9 in the last 360 days

## 2023-07-18 ENCOUNTER — Ambulatory Visit: Payer: 59 | Attending: Critical Care Medicine

## 2023-07-18 VITALS — Ht 69.0 in | Wt 203.0 lb

## 2023-07-18 DIAGNOSIS — Z0000 Encounter for general adult medical examination without abnormal findings: Secondary | ICD-10-CM

## 2023-07-18 NOTE — Patient Instructions (Addendum)
Ryan Long , Thank you for taking time to come for your Medicare Wellness Visit. I appreciate your ongoing commitment to your health goals. Please review the following plan we discussed and let me know if I can assist you in the future.   Referrals/Orders/Follow-Ups/Clinician Recommendations: Aim for 30 minutes of exercise or brisk walking, 6-8 glasses of water, and 5 servings of fruits and vegetables each day.  This is a list of the screening recommended for you and due dates:  Health Maintenance  Topic Date Due   COVID-19 Vaccine (8 - 2023-24 season) 08/03/2023*   Flu Shot  07/19/2023   Screening for Lung Cancer  10/12/2023   Cologuard (Stool DNA test)  03/30/2024   Medicare Annual Wellness Visit  07/17/2024   DTaP/Tdap/Td vaccine (7 - Td or Tdap) 06/08/2031   Hepatitis C Screening  Completed   HIV Screening  Completed   Zoster (Shingles) Vaccine  Completed   HPV Vaccine  Aged Out  *Topic was postponed. The date shown is not the original due date.    Advanced directives: (ACP Link)Information on Advanced Care Planning can be found at [facility] of [facility] Advance Health Care Directives Advance Health Care Directives ([email or web address]   Next Medicare Annual Wellness Visit scheduled for next year: Yes  Preventive Care 40-64 Years, Male Preventive care refers to lifestyle choices and visits with your health care provider that can promote health and wellness. What does preventive care include? A yearly physical exam. This is also called an annual well check. Dental exams once or twice a year. Routine eye exams. Ask your health care provider how often you should have your eyes checked. Personal lifestyle choices, including: Daily care of your teeth and gums. Regular physical activity. Eating a healthy diet. Avoiding tobacco and drug use. Limiting alcohol use. Practicing safe sex. Taking low-dose aspirin every day starting at age 71. What happens during an annual well  check? The services and screenings done by your health care provider during your annual well check will depend on your age, overall health, lifestyle risk factors, and family history of disease. Counseling  Your health care provider may ask you questions about your: Alcohol use. Tobacco use. Drug use. Emotional well-being. Home and relationship well-being. Sexual activity. Eating habits. Work and work Astron[employer]. Screening  You may have the following tests or measurements: Height, weight, and BMI. Blood pressure. Lipid and cholesterol levels. These may be checked every 5 years, or more frequently if you are over 80 years old. Skin check. Lung cancer screening. You may have this screening every year starting at age 78 if you have a 30-pack-year history of smoking and currently smoke or have quit within the past 15 years. Fecal occult blood test (FOBT) of the stool. You may have this test every year starting at age 70. Flexible sigmoidoscopy or colonoscopy. You may have a sigmoidoscopy every 5 years or a colonoscopy every 10 years starting at age 24. Prostate cancer screening. Recommendations will vary depending on your family history and other risks. Hepatitis C blood test. Hepatitis B blood test. Sexually transmitted disease (STD) testing. Diabetes screening. This is done by checking your blood sugar (glucose) after you have not eaten for a while (fasting). You may have this done every 1-3 years. Discuss your test results, treatment options, and if necessary, the need for more tests with your health care provider. Vaccines  Your health care provider may recommend certain vaccines, such as: Influenza vaccine. This is recommended every year. Tetanus,  diphtheria, and acellular pertussis (Tdap, Td) vaccine. You may need a Td booster every 10 years. Zoster vaccine. You may need this after age 26. Pneumococcal 13-valent conjugate (PCV13) vaccine. You may need this if you have certain  conditions and have not been vaccinated. Pneumococcal polysaccharide (PPSV23) vaccine. You may need one or two doses if you smoke cigarettes or if you have certain conditions. Talk to your health care provider about which screenings and vaccines you need and how often you need them. This information is not intended to replace advice given to you by your health care provider. Make sure you discuss any questions you have with your health care provider. Document Released: 12/31/2015 Document Revised: 08/23/2016 Document Reviewed: 10/05/2015 Elsevier Interactive Patient Education  2017 [employer].  Fall Prevention in the Home Falls can cause injuries. They can happen to people of all ages. There are many things you can do to make your home safe and to help prevent falls. What can I do on the outside of my home? Regularly fix the edges of walkways and driveways and fix any cracks. Remove anything that might make you trip as you walk through a door, such as a raised step or threshold. Trim any bushes or trees on the path to your home. Use bright outdoor lighting. Clear any walking paths of anything that might make someone trip, such as rocks or tools. Regularly check to see if handrails are loose or broken. Make sure that both sides of any steps have handrails. Any raised decks and porches should have guardrails on the edges. Have any leaves, snow, or ice cleared regularly. Use sand or salt on walking paths during winter. Clean up any spills in your garage right away. This includes oil or grease spills. What can I do in the bathroom? Use night lights. Install grab bars by the toilet and in the tub and shower. Do not use towel bars as grab bars. Use non-skid mats or decals in the tub or shower. If you need to sit down in the shower, use a plastic, non-slip stool. Keep the floor dry. Clean up any water that spills on the floor as soon as it happens. Remove soap buildup in the tub or shower  regularly. Attach bath mats securely with double-sided non-slip rug tape. Do not have throw rugs and other things on the floor that can make you trip. What can I do in the bedroom? Use night lights. Make sure that you have a light by your bed that is easy to reach. Do not use any sheets or blankets that are too big for your bed. They should not hang down onto the floor. Have a firm chair that has side arms. You can use this for support while you get dressed. Do not have throw rugs and other things on the floor that can make you trip. What can I do in the kitchen? Clean up any spills right away. Avoid walking on wet floors. Keep items that you use a lot in easy-to-reach places. If you need to reach something above you, use a strong step stool that has a grab bar. Keep electrical cords out of the way. Do not use floor polish or wax that makes floors slippery. If you must use wax, use non-skid floor wax. Do not have throw rugs and other things on the floor that can make you trip. What can I do with my stairs? Do not leave any items on the stairs. Make sure that there are handrails  on both sides of the stairs and use them. Fix handrails that are broken or loose. Make sure that handrails are as long as the stairways. Check any carpeting to make sure that it is firmly attached to the stairs. Fix any carpet that is loose or worn. Avoid having throw rugs at the top or bottom of the stairs. If you do have throw rugs, attach them to the floor with carpet tape. Make sure that you have a light switch at the top of the stairs and the bottom of the stairs. If you do not have them, ask someone to add them for you. What else can I do to help prevent falls? Wear shoes that: Do not have high heels. Have rubber bottoms. Are comfortable and fit you well. Are closed at the toe. Do not wear sandals. If you use a stepladder: Make sure that it is fully opened. Do not climb a closed stepladder. Make sure that  both sides of the stepladder are locked into place. Ask someone to hold it for you, if possible. Clearly mark and make sure that you can see: Any grab bars or handrails. First and last steps. Where the edge of each step is. Use tools that help you move around (mobility aids) if they are needed. These include: Canes. Walkers. Scooters. Crutches. Turn on the lights when you go into a dark area. Replace any light bulbs as soon as they burn out. Set up your furniture so you have a clear path. Avoid moving your furniture around. If any of your floors are uneven, fix them. If there are any pets around you, be aware of where they are. Review your medicines with your doctor. Some medicines can make you feel dizzy. This can increase your chance of falling. Ask your doctor what other things that you can do to help prevent falls. This information is not intended to replace advice given to you by your health care provider. Make sure you discuss any questions you have with your health care provider. Document Released: 09/30/2009 Document Revised: 05/11/2016 Document Reviewed: 01/08/2015 Elsevier Interactive Patient Education  2017 [employer].

## 2023-07-18 NOTE — Progress Notes (Signed)
Subjective:   Ryan Long is a 62 y.o. male who presents for Medicare Annual/Subsequent preventive examination.  Visit Complete: Virtual  I connected with  Ryan Long on 07/18/23 by a audio enabled telemedicine application and verified that I am speaking with the correct person using two identifiers.  Patient Location: Home  Provider Location: Home Office  I discussed the limitations of evaluation and management by telemedicine. The patient expressed understanding and agreed to proceed.  Patient Medicare AWV questionnaire was completed by the patient on 07/17/23; I have confirmed that all information answered by patient is correct and no changes since this date.  Vital Signs: Per patient no change in vitals since last visit.  Review of Systems     Cardiac Risk Factors include: advanced age (>46men, >51 women);male gender;hypertension     Objective:    Today's Vitals   07/18/23 0838  Weight: 203 lb (92.1 kg)  Height: 5\' 9"  (1.753 m)   Body mass index is 29.98 kg/m.     07/18/2023    2:04 PM 11/15/2022   12:38 PM 11/15/2022   12:11 PM 08/29/2021    9:48 AM 03/18/2021    7:14 AM 08/07/2020    8:46 PM 06/25/2020    3:16 AM  Advanced Directives  Does Patient Have a Medical Advance Directive? No No No No No No   Would patient like information on creating a medical advance directive? Yes (MAU/Ambulatory/Procedural Areas - Information given) Yes (Inpatient - patient defers creating a medical advance directive at this time - Information given)  No - Patient declined No - Patient declined No - Patient declined      Information is confidential and restricted. Go to Review Flowsheets to unlock data.    Current Medications (verified) Outpatient Encounter Medications as of 07/18/2023  Medication Sig   aspirin EC 81 MG tablet Take 81 mg by mouth daily. Swallow whole.   azelastine (ASTELIN) 0.1 % nasal spray Place 2 sprays into both nostrils 2 (two) times daily. Use  in each nostril as directed   bictegravir-emtricitabine-tenofovir AF (BIKTARVY) 50-200-25 MG TABS tablet Take 1 tablet by mouth daily.   empagliflozin (JARDIANCE) 10 MG TABS tablet Take 1 tablet (10 mg total) by mouth daily before breakfast.   eplerenone (INSPRA) 25 MG tablet TAKE 0.5 TABLET BY MOUTH EVERY DAY   finasteride (PROSCAR) 5 MG tablet TAKE 1 TABLET(5 MG) BY MOUTH DAILY   fluticasone (FLONASE) 50 MCG/ACT nasal spray Place 2 sprays into both nostrils daily.   Melatonin 10 MG CAPS Take 10 mg by mouth at bedtime as needed (sleep).   metoprolol succinate (TOPROL-XL) 50 MG 24 hr tablet Take 1 tablet (50 mg total) by mouth daily. For high blood pressure   Multiple Vitamins-Minerals (MENS MULTIPLUS PO) Take 1 tablet by mouth daily.   potassium chloride SA (KLOR-CON M) 20 MEQ tablet Take 1 tablet (20 mEq total) by mouth daily.   rosuvastatin (CRESTOR) 5 MG tablet Take 1 tablet (5 mg total) by mouth daily.   sacubitril-valsartan (ENTRESTO) 24-26 MG Take 1 tablet by mouth 2 (two) times daily.   sildenafil (VIAGRA) 50 MG tablet Take 1 tablet (50 mg total) by mouth as needed for erectile dysfunction.   tamsulosin (FLOMAX) 0.4 MG CAPS capsule TAKE 2 CAPSULES(0.8 MG) BY MOUTH DAILY AFTER SUPPER   valACYclovir (VALTREX) 500 MG tablet Take 1 tablet (500 mg total) by mouth daily.   nicotine (NICODERM CQ - DOSED IN MG/24 HOURS) 21 mg/24hr patch  (Patient  not taking: Reported on 12/19/2022)   Varenicline Tartrate, Starter, 0.5 MG X 11 & 1 MG X 42 TBPK Take one 0.5 mg tab by mouth once daily for 3 days, increase to one 0.5 mg tab twice daily for 4 days, increase to 1 mg tab twice daily. (Patient not taking: Reported on 12/01/2022)   [DISCONTINUED] fluticasone furoate-vilanterol (BREO ELLIPTA) 100-25 MCG/ACT AEPB Inhale 1 puff into the lungs daily.   [DISCONTINUED] hydrOXYzine (ATARAX) 25 MG tablet Take 1-2 tablets (25-50 mg total) by mouth 2 (two) times daily as needed.   [DISCONTINUED] olopatadine (PATANOL)  0.1 % ophthalmic solution Place 1 drop into both eyes 2 (two) times daily.   [DISCONTINUED] predniSONE (DELTASONE) 10 MG tablet Take 4 tablets daily for 5 days then stop   [DISCONTINUED] traZODone (DESYREL) 100 MG tablet TAKE 1 TABLET BY MOUTH AT BEDTIME AS NEEDED FOR SLEEP   [DISCONTINUED] triamcinolone cream (KENALOG) 0.1 % Apply 1 Application topically 2 (two) times daily.   No facility-administered encounter medications on file as of 07/18/2023.    Allergies (verified) Patient has no known allergies.   History: Past Medical History:  Diagnosis Date   Active smoker    AICD (automatic cardioverter/defibrillator) present    Alcohol abuse    Allergy July 2023   Anxiety    AR (allergic rhinitis)    Bipolar 1 disorder (HCC)    Cervical lymphadenitis 04/20/2021   CHF (congestive heart failure) (HCC)    Chronic bronchitis (HCC)    Chronic systolic heart failure (HCC)    Controlled substance agreement signed 10/22/2018   COPD (chronic obstructive pulmonary disease) (HCC)    Cough 12/31/2018   Crack cocaine use    Depression    Genital herpes    HIV (human immunodeficiency virus infection) (HCC) dx'd 08/1993   HLD (hyperlipidemia)    Hypertension    NICM (nonischemic cardiomyopathy) (HCC)    Echocardiogram 06/28/11: EF 30-35%, mild MR, mild LAE;  No CAD by coronary CT angiogram 3/12 at [facility]   NSVT (nonsustained ventricular tachycardia) (HCC)    PTSD (post-traumatic stress disorder)    Past Surgical History:  Procedure Laterality Date   CARDIAC DEFIBRILLATOR PLACEMENT  01/08/2018   ICD IMPLANT N/A 01/08/2018   Procedure: ICD IMPLANT;  Surgeon: Regan Lemming, MD;  Location: [facility];  Service: Cardiovascular;  Laterality: N/A;   Family History  Problem Relation Age of Onset   Glaucoma Mother    Mental illness Mother    Vision loss Mother    Hypertension Father    CAD Father    Mental illness Father    Alcohol abuse Father    Drug  abuse Father    Heart attack Father 75   Early death Father    Heart disease Father    Alcohol abuse Brother    Drug abuse Brother    Drug abuse Brother    Social History   Socioeconomic History   Marital status: Single    Spouse name: Not on file   Number of children: Not on file   Years of education: Not on file   Highest education level: GED or equivalent  Occupational History   Not on file  Tobacco Use   Smoking status: Every Day    Current packs/day: 0.75    Average packs/day: 0.8 packs/day for 42.0 years (31.5 ttl pk-yrs)    Types: Cigarettes   Smokeless tobacco: Never   Tobacco comments:    hoping welbutrin will help  Vaping Use   Vaping status: Never Used  Substance and Sexual Activity   Alcohol use: Not Currently    Alcohol/week: 6.0 standard drinks of alcohol    Comment: 01/08/2018 "stopped 06/2017"   Drug use: Not Currently    Types: "Crack" cocaine    Comment: 01/08/2018 "stopped 06/2017"   Sexual activity: Yes    Birth control/protection: Condom    Comment: pt. declined condoms  Other Topics Concern   Not on file  Social History Narrative   Not on file   Social Determinants of Health   Financial Resource Strain: Low Risk  (07/17/2023)   Overall Financial Resource Strain (CARDIA)    Difficulty of Paying Living Expenses: Not hard at all  Food Insecurity: No Food Insecurity (07/17/2023)   Hunger Vital Sign    Worried About Running Out of Food in the Last Year: Never true    Ran Out of Food in the Last Year: Never true  Transportation Needs: No Transportation Needs (07/17/2023)   PRAPARE - Administrator, Civil Service (Medical): No    Lack of Transportation (Non-Medical): No  Physical Activity: Insufficiently Active (07/17/2023)   Exercise Vital Sign    Days of Exercise per Week: 3 days    Minutes of Exercise per Session: 20 min  Stress: Stress Concern Present (07/17/2023)   [employer] of Occupational Health - Occupational Stress  Questionnaire    Feeling of Stress : To some extent  Social Connections: Moderately Integrated (07/17/2023)   Social Connection and Isolation Panel [NHANES]    Frequency of Communication with Friends and Family: Twice a week    Frequency of Social Gatherings with Friends and Family: More than three times a week    Attends Religious Services: 1 to 4 times per year    Active Member of [employer] or Organizations: Yes    Attends Engineer, structural: More than 4 times per year    Marital Status: Never married    Tobacco Counseling Ready to quit: Yes Counseling given: Not Answered Tobacco comments: hoping welbutrin will help   Clinical Intake:  Pre-visit preparation completed: Yes  Pain : No/denies pain     Diabetes: No  How often do you need to have someone help you when you read instructions, pamphlets, or other written materials from your doctor or pharmacy?: 1 - Never  Interpreter Needed?: No  Information entered by :: Kandis Fantasia LPN   Activities of Daily Living    07/17/2023    1:59 PM 07/16/2023    2:15 PM  In your present state of health, do you have any difficulty performing the following activities:  Hearing? 0 0  Vision? 0 0  Difficulty concentrating or making decisions? 0 0  Walking or climbing stairs? 0 0  Dressing or bathing? 0 0  Doing errands, shopping? 0 0  Preparing Food and eating ? N N  Using the Toilet? N N  In the past six months, have you accidently leaked urine? Y Y  Do you have problems with loss of bowel control? N N  Managing your Medications? N N  Managing your Finances? N N  Housekeeping or managing your Housekeeping? N N    Patient Care Team: Storm Frisk, MD as PCP - General (Pulmonary Disease) Regan Lemming, MD as PCP - Electrophysiology (Cardiology) Regan Lemming, MD as Consulting Physician (Cardiology) Conley Rolls, [place], [place] as Referring Physician (Optometry) Daiva Eves, Lisette Grinder, MD as Consulting Physician  (Infectious Diseases)  Indicate any recent Medical Services you may have received from other than [facility] providers in the past year (date may be approximate).     Assessment:   This is a routine wellness examination for [place].  Hearing/Vision screen Hearing Screening - Comments:: Denies hearing difficulties   Vision Screening - Comments:: Wears rx glasses - up to date with routine eye exams with [facility]    Dietary issues and exercise activities discussed:     Goals Addressed             This Visit's Progress    Remain active and independent        Depression Screen    07/18/2023    2:02 PM 12/19/2022   10:34 AM 08/08/2022    8:41 AM 10/03/2021    8:00 AM 09/12/2021    8:41 AM 05/23/2021    8:42 AM 04/25/2021    8:36 AM  PHQ 2/9 Scores  PHQ - 2 Score 0 0 0 2 0 0 0  PHQ- 9 Score    6  1 0    Fall Risk    07/17/2023    1:59 PM 07/16/2023    2:15 PM 12/19/2022   10:34 AM 11/15/2022   11:16 AM 08/08/2022    8:41 AM  Fall Risk   Falls in the past year? 0 0 0 0 0  Number falls in past yr: 0 0 0 0 0  Injury with Fall? 0 0 0 0 0  Risk for fall due to : No Fall Risks  No Fall Risks No Fall Risks No Fall Risks  Follow up Falls prevention discussed;Education provided;Falls evaluation completed  Falls evaluation completed  Falls evaluation completed    MEDICARE RISK AT HOME:   TIMED UP AND GO:  Was the test performed?  No    Cognitive Function:    11/15/2022   12:38 PM  MMSE - Mini Mental State Exam  Orientation to time 5  Orientation to Place 5  Registration 3  Attention/ Calculation 5  Recall 3  Language- name 2 objects 2  Language- repeat 1  Language- follow 3 step command 3  Language- read & follow direction 1  Write a sentence 1  Copy design 1  Total score 30        07/18/2023    2:05 PM 11/15/2022   12:38 PM  6CIT Screen  What Year? 0 points 0 points  What month? 0 points 0 points  What time? 0 points 0 points  Count back from 20 0 points  0 points  Months in reverse 0 points 0 points  Repeat phrase 0 points   Total Score 0 points     Immunizations Immunization History  Administered Date(s) Administered   Hep A / Hep B 06/06/2010, 08/05/2010, 06/22/2014   Hepatitis B 08/24/2011, 09/25/2011   Hepatitis B, PED/ADOLESCENT 08/24/2011, 09/25/2011   Influenza Split 09/20/2009, 09/29/2010, 08/24/2011, 09/25/2011, 09/04/2018, 10/29/2019   Influenza Whole 09/20/2009, 08/24/2011   Influenza,inj,Quad PF,6+ Mos 12/22/2013, 08/16/2015, 11/17/2016, 09/12/2017, 08/15/2019   Influenza-Unspecified 11/17/2008, 09/29/2010, 09/25/2011, 09/16/2012, 10/16/2013, 12/22/2013, 08/16/2015, 11/17/2016, 09/12/2017, 10/18/2017, 09/04/2018, 08/15/2019, 10/29/2019   Janssen (J&J) SARS-COV-2 Vaccination 03/05/2020   Moderna SARS-COV2 Booster Vaccination 02/02/2021   Moderna Sars-Covid-2 Vaccination 02/02/2021, 03/16/2021   Novel Infuenza-h1n1-09 11/17/2008   PFIZER Comirnaty(Gray Top)Covid-19 Tri-Sucrose Vaccine 09/29/2020, 10/10/2020, 09/04/2021   PFIZER(Purple Top)SARS-COV-2 Vaccination 09/29/2020, 09/04/2021   PNEUMOCOCCAL CONJUGATE-20 05/23/2021   PPD Test 11/19/2007, 11/01/2010   Pfizer Covid-19 Vaccine Bivalent Booster 31yrs &  up 09/02/2021   Pneumococcal Conjugate-13 02/10/2014   Pneumococcal Polysaccharide-23 05/23/2004, 10/01/2009, 10/14/2012, 01/17/2016   Td (Adult),5 Lf Tetanus Toxid, Preservative Free 04/20/1994   Tdap 04/20/1994, 11/19/2007, 02/14/2013, 02/10/2014, 06/07/2021   Zoster Recombinant(Shingrix) 06/13/2018, 08/13/2018   Zoster, Live 08/13/2018   Zoster, Unspecified 06/13/2018, 08/13/2018    TDAP status: Up to date  Pneumococcal vaccine status: Up to date  Covid-19 vaccine status: Information provided on how to obtain vaccines.   Qualifies for Shingles Vaccine? Yes   Zostavax completed Yes   Shingrix Completed?: Yes  Screening Tests Health Maintenance  Topic Date Due   COVID-19 Vaccine (8 - 2023-24 season)  08/03/2023 (Originally 08/18/2022)   INFLUENZA VACCINE  07/19/2023   Lung Cancer Screening  10/12/2023   Fecal DNA (Cologuard)  03/30/2024   Medicare Annual Wellness (AWV)  07/17/2024   DTaP/Tdap/Td (7 - Td or Tdap) 06/08/2031   Hepatitis C Screening  Completed   HIV Screening  Completed   Zoster Vaccines- Shingrix  Completed   HPV VACCINES  Aged Out    Health Maintenance  There are no preventive care reminders to display for this patient.  Colorectal cancer screening: Type of screening: Cologuard. Completed 03/30/21. Repeat every 3 years  Lung Cancer Screening: (Low Dose CT Chest recommended if Age 83-80 years, 20 pack-year currently smoking OR have quit w/in 15years.) does qualify.   Lung Cancer Screening Referral: last 10/11/22  Additional Screening:  Hepatitis C Screening: does qualify; Completed 02/08/19  Vision Screening: Recommended annual ophthalmology exams for early detection of glaucoma and other disorders of the eye. Is the patient up to date with their annual eye exam?  Yes  Who is the provider or what is the name of the office in which the patient attends annual eye exams? [facility])  If pt is not established with a provider, would they like to be referred to a provider to establish care? No .   Dental Screening: Recommended annual dental exams for proper oral hygiene  Community Resource Referral / Chronic Care Management: CRR required this visit?  No   CCM required this visit?  No     Plan:     I have personally reviewed and noted the following in the patient's chart:   Medical and social history Use of alcohol, tobacco or illicit drugs  Current medications and supplements including opioid prescriptions. Patient is not currently taking opioid prescriptions. Functional ability and status Nutritional status Physical activity Advanced directives List of other physicians Hospitalizations, surgeries, and ER visits in previous 12  months Vitals Screenings to include cognitive, depression, and falls Referrals and appointments  In addition, I have reviewed and discussed with patient certain preventive protocols, quality metrics, and best practice recommendations. A written personalized care plan for preventive services as well as general preventive health recommendations were provided to patient.     Kandis Fantasia [place], [place]   1/61/0960   After Visit Summary: (MyChart) Due to this being a telephonic visit, the after visit summary with patients personalized plan was offered to patient via MyChart   Nurse Notes: No concerns at this time

## 2023-07-19 ENCOUNTER — Other Ambulatory Visit (HOSPITAL_COMMUNITY): Payer: Self-pay

## 2023-07-26 ENCOUNTER — Encounter: Payer: Self-pay | Admitting: Critical Care Medicine

## 2023-07-26 ENCOUNTER — Telehealth: Payer: Self-pay

## 2023-07-26 MED ORDER — TAMSULOSIN HCL 0.4 MG PO CAPS: ORAL_CAPSULE | ORAL | 5 refills | Status: DC

## 2023-07-26 NOTE — Telephone Encounter (Signed)
Copied from CRM [id number]. Topic: General - Other >> Jul 26, 2023  3:45 PM [place] B wrote: Reason for CRM: pt called in requesting, social worker wants to get assistance with housing. He is facing eviction. He has court appt on 08/26.

## 2023-07-29 ENCOUNTER — Encounter: Payer: Self-pay | Admitting: Critical Care Medicine

## 2023-07-30 ENCOUNTER — Other Ambulatory Visit: Payer: Self-pay | Admitting: Family

## 2023-07-30 DIAGNOSIS — B20 Human immunodeficiency virus [HIV] disease: Secondary | ICD-10-CM

## 2023-07-30 MED ORDER — ALBUTEROL SULFATE HFA 108 (90 BASE) MCG/ACT IN AERS: 2.0000 | INHALATION_SPRAY | Freq: Four times a day (QID) | RESPIRATORY_TRACT | 3 refills | Status: DC | PRN

## 2023-07-31 ENCOUNTER — Encounter (HOSPITAL_COMMUNITY): Payer: Self-pay

## 2023-07-31 ENCOUNTER — Telehealth (HOSPITAL_COMMUNITY): Payer: Self-pay

## 2023-07-31 ENCOUNTER — Other Ambulatory Visit: Payer: 59

## 2023-07-31 ENCOUNTER — Other Ambulatory Visit: Payer: Self-pay

## 2023-07-31 ENCOUNTER — Telehealth (HOSPITAL_COMMUNITY): Payer: Self-pay | Admitting: Licensed Clinical Social Worker

## 2023-07-31 ENCOUNTER — Ambulatory Visit (HOSPITAL_COMMUNITY): Admission: RE | Admit: 2023-07-31 | Payer: 59 | Source: Ambulatory Visit

## 2023-07-31 ENCOUNTER — Telehealth (HOSPITAL_COMMUNITY): Payer: Self-pay | Admitting: Internal Medicine

## 2023-07-31 ENCOUNTER — Encounter (HOSPITAL_COMMUNITY): Payer: 59

## 2023-07-31 DIAGNOSIS — B20 Human immunodeficiency virus [HIV] disease: Secondary | ICD-10-CM

## 2023-07-31 DIAGNOSIS — Z113 Encounter for screening for infections with a predominantly sexual mode of transmission: Secondary | ICD-10-CM

## 2023-07-31 NOTE — Telephone Encounter (Signed)
Called and left voice message to confirm/remind patient of their appointment [facility] on 07/31/23.
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
## 2023-07-31 NOTE — Telephone Encounter (Signed)
Pt has  a few same day cancellations, please advise

## 2023-07-31 NOTE — Telephone Encounter (Addendum)
CSW contacted patient to follow up on multiple cancellations. Patient denies any concerns with transportation and did not give an specific reason for rescheduling. Patient became belligerent on the phone demanding to see CSW today. CSW inquired why did patient cancel if he had needs to be addressed. Patient became increasingly more angry and did not share the needs that he wanted to see CSW today vs during his rescheduled appointment. Patient stated "do not call me again". CSW will follow up with patient at rescheduled visit if willing to see CSW. Lasandra Beech, LCSW, CCSW-MCS [phone]

## 2023-08-01 ENCOUNTER — Ambulatory Visit: Payer: 59 | Attending: Physician Assistant | Admitting: Physician Assistant

## 2023-08-01 ENCOUNTER — Other Ambulatory Visit: Payer: 59

## 2023-08-01 ENCOUNTER — Encounter: Payer: Self-pay | Admitting: Physician Assistant

## 2023-08-01 VITALS — BP 123/68 | HR 70 | Wt 202.0 lb

## 2023-08-01 DIAGNOSIS — B20 Human immunodeficiency virus [HIV] disease: Secondary | ICD-10-CM

## 2023-08-01 DIAGNOSIS — J3089 Other allergic rhinitis: Secondary | ICD-10-CM

## 2023-08-01 DIAGNOSIS — E782 Mixed hyperlipidemia: Secondary | ICD-10-CM | POA: Diagnosis not present

## 2023-08-01 NOTE — Patient Instructions (Addendum)
Drink 80 ounces water daily.  Keep your upcoming appointments  Have the pharmacy contact us for any RF you need

## 2023-08-01 NOTE — Progress Notes (Signed)
Patient ID: Ryan Long, male   DOB: Aug 30, 1961, 62 y.o.   MRN: [id number]   Ryan Long, is a 62 y.o. male  [id number]  [id number]  DOB - 1961-02-17  Chief Complaint  Patient presents with   Dizziness   Headache       Subjective:   Ryan Long is a 62 y.o. male here today for general visit.  He is feeling good.  He was supposed to see ID/have labs and echo this week but rescheduled.  No active concerns or complaints.  No CP/SOB.  Does not know of any RF that he knows of.   No problems updated.  ALLERGIES: No Known Allergies  PAST MEDICAL HISTORY: Past Medical History:  Diagnosis Date   Active smoker    AICD (automatic cardioverter/defibrillator) present    Alcohol abuse    Allergy July 2023   Anxiety    AR (allergic rhinitis)    Bipolar 1 disorder (HCC)    Cervical lymphadenitis 04/20/2021   CHF (congestive heart failure) (HCC)    Chronic bronchitis (HCC)    Chronic systolic heart failure (HCC)    Controlled substance agreement signed 10/22/2018   COPD (chronic obstructive pulmonary disease) (HCC)    Cough 12/31/2018   Crack cocaine use    Depression    Genital herpes    HIV (human immunodeficiency virus infection) (HCC) dx'd 08/1993   HLD (hyperlipidemia)    Hypertension    NICM (nonischemic cardiomyopathy) (HCC)    Echocardiogram 06/28/11: EF 30-35%, mild MR, mild LAE;  No CAD by coronary CT angiogram 3/12 at [facility]   NSVT (nonsustained ventricular tachycardia) (HCC)    PTSD (post-traumatic stress disorder)     MEDICATIONS AT HOME: Prior to Admission medications   Medication Sig Start Date End Date Taking? Authorizing Provider  albuterol (VENTOLIN HFA) 108 (90 Base) MCG/ACT inhaler Inhale 2 puffs into the lungs every 6 (six) hours as needed for wheezing or shortness of breath. 07/30/23  Yes Storm Frisk, MD  aspirin EC 81 MG tablet Take 81 mg by mouth daily. Swallow whole.   Yes [provider]  azelastine  (ASTELIN) 0.1 % nasal spray Place 2 sprays into both nostrils 2 (two) times daily. Use in each nostril as directed 01/23/23  Yes Storm Frisk, MD  bictegravir-emtricitabine-tenofovir AF (BIKTARVY) 50-200-25 MG TABS tablet TAKE 1 TABLET BY MOUTH DAILY 07/30/23  Yes Veryl Speak, FNP  empagliflozin (JARDIANCE) 10 MG TABS tablet Take 1 tablet (10 mg total) by mouth daily before breakfast. 12/01/22  Yes [place], Anderson Malta, FNP  eplerenone (INSPRA) 25 MG tablet TAKE 0.5 TABLET BY MOUTH EVERY DAY 02/05/23  Yes Storm Frisk, MD  finasteride (PROSCAR) 5 MG tablet TAKE 1 TABLET(5 MG) BY MOUTH DAILY 03/15/23  Yes Storm Frisk, MD  fluticasone (FLONASE) 50 MCG/ACT nasal spray Place 2 sprays into both nostrils daily. 01/23/23  Yes Storm Frisk, MD  Melatonin 10 MG CAPS Take 10 mg by mouth at bedtime as needed (sleep).   Yes [provider]  metoprolol succinate (TOPROL-XL) 50 MG 24 hr tablet Take 1 tablet (50 mg total) by mouth daily. For high blood pressure 12/18/22  Yes Storm Frisk, MD  Multiple Vitamins-Minerals (MENS MULTIPLUS PO) Take 1 tablet by mouth daily.   Yes [provider]  nicotine (NICODERM CQ - DOSED IN MG/24 HOURS) 21 mg/24hr patch    Yes [provider]  potassium chloride SA (KLOR-CON M) 20 MEQ tablet Take  1 tablet (20 mEq total) by mouth daily. 11/15/22  Yes Storm Frisk, MD  rosuvastatin (CRESTOR) 5 MG tablet Take 1 tablet (5 mg total) by mouth daily. 01/22/23  Yes Storm Frisk, MD  sacubitril-valsartan (ENTRESTO) 24-26 MG Take 1 tablet by mouth 2 (two) times daily. 06/06/22  Yes Kuppelweiser, Cassie L, RPH-CPP  sildenafil (VIAGRA) 50 MG tablet Take 1 tablet (50 mg total) by mouth as needed for erectile dysfunction. 11/15/22  Yes Storm Frisk, MD  tamsulosin (FLOMAX) 0.4 MG CAPS capsule TAKE 2 CAPSULES(0.8 MG) BY MOUTH DAILY AFTER SUPPER 07/26/23  Yes Storm Frisk, MD  valACYclovir (VALTREX) 500 MG tablet Take 1 tablet (500 mg  total) by mouth daily. 11/15/22  Yes Storm Frisk, MD  Varenicline Tartrate, Starter, 0.5 MG X 11 & 1 MG X 42 TBPK  01/26/22  Yes [provider]    ROS: Neg HEENT Neg resp Neg cardiac Neg GI Neg GU Neg MS Neg psych Neg neuro  Objective:   Vitals:   08/01/23 0842  BP: 123/68  Pulse: 70  SpO2: 96%  Weight: 202 lb (91.6 kg)   Exam General appearance : Awake, alert, not in any distress. Speech Clear. Not toxic looking HEENT: Atraumatic and Normocephalic Neck: Supple, no JVD. No cervical lymphadenopathy.  Chest: Good air entry bilaterally, CTAB.  No rales/rhonchi/wheezing CVS: S1 S2 regular, no murmurs.  Extremities: B/L Lower Ext shows no edema, both legs are warm to touch Neurology: Awake alert, and oriented X 3, CN II-XII intact, Non focal Skin: No Rash  Data Review Lab Results  Component Value Date   HGBA1C 5.7 (H) 06/25/2020   HGBA1C 5.3 09/24/2015   HGBA1C 5.3 08/08/2015    Assessment & Plan   1. Mixed hyperlipidemia He will have the pharmacy call if he needs RF of any meds.  Has rescheduled his echo  2. Human immunodeficiency virus (HIV) disease (HCC) On Bitkarvy-has labs scheduled and ID visit  3. Non-seasonal allergic rhinitis due to fungal spores Continue nasal spray.  He is moving from the mold infected place at end of September.    Return in about 4 months (around 12/01/2023) for please assign to different PCP([facility] currently) for his follow up appointment.  The patient was given clear instructions to go to ER or return to medical center if symptoms don't improve, worsen or new problems develop. The patient verbalized understanding. The patient was told to call to get lab results if they haven't heard anything in the next week.      Georgian Co, PA-C [facility] [place], [place] [phone]   08/01/2023, 8:56 AM

## 2023-08-02 ENCOUNTER — Other Ambulatory Visit: Payer: 59

## 2023-08-02 ENCOUNTER — Encounter (HOSPITAL_COMMUNITY): Payer: Self-pay

## 2023-08-02 ENCOUNTER — Encounter (HOSPITAL_COMMUNITY): Payer: 59

## 2023-08-03 ENCOUNTER — Other Ambulatory Visit: Payer: 59

## 2023-08-03 ENCOUNTER — Other Ambulatory Visit: Payer: Self-pay

## 2023-08-03 DIAGNOSIS — Z113 Encounter for screening for infections with a predominantly sexual mode of transmission: Secondary | ICD-10-CM

## 2023-08-03 DIAGNOSIS — B20 Human immunodeficiency virus [HIV] disease: Secondary | ICD-10-CM

## 2023-08-03 NOTE — Addendum Note (Signed)
Addended by: Tressa Busman T on: 08/03/2023 09:08 AM   Modules accepted: Orders

## 2023-08-04 LAB — C. TRACHOMATIS/N. GONORRHOEAE RNA
C. trachomatis RNA, TMA: NOT DETECTED
N. gonorrhoeae RNA, TMA: NOT DETECTED

## 2023-08-07 LAB — COMPLETE METABOLIC PANEL WITH GFR
AG Ratio: 2 (calc) (ref 1.0–2.5)
ALT: 24 U/L (ref 9–46)
AST: 21 U/L (ref 10–35)
Albumin: 4.1 g/dL (ref 3.6–5.1)
Alkaline phosphatase (APISO): 49 U/L (ref 35–144)
BUN/Creatinine Ratio: 12 (calc) (ref 6–22)
BUN: 18 mg/dL (ref 7–25)
CO2: 24 mmol/L (ref 20–32)
Calcium: 9 mg/dL (ref 8.6–10.3)
Chloride: 106 mmol/L (ref 98–110)
Creat: 1.5 mg/dL — ABNORMAL HIGH (ref 0.70–1.35)
Globulin: 2.1 g/dL (ref 1.9–3.7)
Glucose, Bld: 87 mg/dL (ref 65–99)
Potassium: 4.5 mmol/L (ref 3.5–5.3)
Sodium: 140 mmol/L (ref 135–146)
Total Bilirubin: 0.8 mg/dL (ref 0.2–1.2)
Total Protein: 6.2 g/dL (ref 6.1–8.1)
eGFR: 52 mL/min/{1.73_m2} — ABNORMAL LOW (ref >=60)

## 2023-08-07 LAB — CBC WITH DIFFERENTIAL/PLATELET
Absolute Monocytes: 295 {cells}/uL (ref 200–950)
Basophils Absolute: 40 {cells}/uL (ref 0–200)
Basophils Relative: 1.6 %
Eosinophils Absolute: 20 {cells}/uL (ref 15–500)
Eosinophils Relative: 0.8 %
HCT: 55.1 % — ABNORMAL HIGH (ref 38.5–50.0)
Hemoglobin: 18.9 g/dL — ABNORMAL HIGH (ref 13.2–17.1)
Lymphs Abs: 1005 {cells}/uL (ref 850–3900)
MCH: 34.1 pg — ABNORMAL HIGH (ref 27.0–33.0)
MCHC: 34.3 g/dL (ref 32.0–36.0)
MCV: 99.3 fL (ref 80.0–100.0)
MPV: 10.7 fL (ref 7.5–12.5)
Monocytes Relative: 11.8 %
Neutro Abs: 1140 {cells}/uL — ABNORMAL LOW (ref 1500–7800)
Neutrophils Relative %: 45.6 %
Platelets: 145 10*3/uL (ref 140–400)
RBC: 5.55 10*6/uL (ref 4.20–5.80)
RDW: 13.8 % (ref 11.0–15.0)
Total Lymphocyte: 40.2 %
WBC: 2.5 10*3/uL — ABNORMAL LOW (ref 3.8–10.8)

## 2023-08-07 LAB — T-HELPER CELLS (CD4) COUNT (NOT AT ARMC)
Absolute CD4: 359 {cells}/uL — ABNORMAL LOW (ref 490–1740)
CD4 T Helper %: 35 % (ref 30–61)
Total lymphocyte count: 1017 {cells}/uL (ref 850–3900)

## 2023-08-07 LAB — HIV-1 RNA QUANT-NO REFLEX-BLD
HIV 1 RNA Quant: NOT DETECTED {copies}/mL
HIV-1 RNA Quant, Log: NOT DETECTED {Log_copies}/mL

## 2023-08-07 LAB — RPR: RPR Ser Ql: NONREACTIVE

## 2023-08-13 ENCOUNTER — Encounter: Payer: Self-pay | Admitting: Critical Care Medicine

## 2023-08-13 NOTE — Progress Notes (Unsigned)
  Electrophysiology Office Note:   Date:  08/13/2023  ID:  Ryan Long, DOB [birth year], MRN [id number]  Primary Cardiologist: None Electrophysiologist: Will [name], MD  {Click to update primary MD,subspecialty MD or APP then REFRESH:1}    History of Present Illness:   Ryan Long is a 62 y.o. male with h/o NICM, chronic sCHF s/p single chamber Abbott ICD, HLD, HIV, substance abuse (ETOH, THC, Cocaine), CKD II & polycythemia seen today for routine electrophysiology followup.   Last remote device interrogation 03/2022 (at [facility]) revealed stable battery and leads but showed multiple episodes of rapid atrial fibrillation. Toprol XL was increased to 100 mg.  He was seen 04/16/2023 at [facility] by Jari Favre, NP and had transferred his care to [facility] Cardiology -Dr. Rudolpho Sevin.  Had experienced shock x2 at that time. He was started on amiodarone.     Since last being seen in our clinic the patient reports doing ***.  he denies chest pain, palpitations, dyspnea, PND, orthopnea, nausea, vomiting, dizziness, syncope, edema, weight gain, or early satiety.   Review of systems complete and found to be negative unless listed in HPI.   EP Information / Studies Reviewed:    {EKGtoday:28818}      ICD Interrogation-  reviewed in detail today,  See PACEART report.  Device History: Abbott Single Chamber ICD implanted 01/08/2018 for NICM, chronic sCHF History of appropriate therapy: Yes History of AAD therapy: Yes; currently on amiodarone, started 03/2023 at [facility]  ***  Studie ECHO 04/2021 > LVEF 20-25%, severely decreased LV function, no RWMA, G1 DD  Risk Assessment/Calculations:   {Does this patient have ATRIAL FIBRILLATION?:[phone]} No BP recorded.  {Refresh Note OR Click here to enter BP  :1}***        Physical Exam:   VS:  There were no vitals taken for this visit.   Wt Readings from Last 3 Encounters:  08/01/23 202 lb (91.6 kg)  07/18/23 203 lb (92.1 kg)   12/19/22 203 lb (92.1 kg)     GEN: Well nourished, well developed in no acute distress NECK: No JVD; No carotid bruits CARDIAC: {EPRHYTHM:28826}, no murmurs, rubs, gallops RESPIRATORY:  Clear to auscultation without rales, wheezing or rhonchi  ABDOMEN: Soft, non-tender, non-distended EXTREMITIES:  No edema; No deformity   ASSESSMENT AND PLAN:    Chronic Systolic Dysfunction s/p Abbott single chamber ICD  -euvolemic today *** -stable on an appropriate medical regimen -normal ICD function -see Pace Art report -no changes today  VT  Seen in 03/2023 at [facility], reported at least 2 shocks, 13 episodes of VT in about 26 hr -started on amiodarone 03/2023 at [facility] -***  Substance Abuse  -***  HIV  -per ID  Disposition:   Follow up with {EPPROVIDERS:28135} {EPFOLLOW UV:25366}   Signed, Canary Brim, MSN, APRN, NP-C, AGACNP-BC [facility] - Electrophysiology  08/13/2023, 8:13 AM

## 2023-08-14 ENCOUNTER — Ambulatory Visit: Payer: Self-pay | Admitting: Infectious Disease

## 2023-08-14 ENCOUNTER — Encounter: Payer: Self-pay | Admitting: Physician Assistant

## 2023-08-14 ENCOUNTER — Ambulatory Visit: Payer: 59 | Attending: Physician Assistant | Admitting: Physician Assistant

## 2023-08-14 VITALS — BP 128/74 | HR 68 | Ht 69.0 in | Wt 204.6 lb

## 2023-08-14 DIAGNOSIS — I428 Other cardiomyopathies: Secondary | ICD-10-CM | POA: Diagnosis not present

## 2023-08-14 DIAGNOSIS — I472 Ventricular tachycardia, unspecified: Secondary | ICD-10-CM

## 2023-08-14 DIAGNOSIS — Z95810 Presence of automatic (implantable) cardiac defibrillator: Secondary | ICD-10-CM

## 2023-08-14 DIAGNOSIS — I4729 Other ventricular tachycardia: Secondary | ICD-10-CM

## 2023-08-14 DIAGNOSIS — Z720 Tobacco use: Secondary | ICD-10-CM

## 2023-08-14 DIAGNOSIS — I5022 Chronic systolic (congestive) heart failure: Secondary | ICD-10-CM | POA: Diagnosis not present

## 2023-08-14 DIAGNOSIS — F1490 Cocaine use, unspecified, uncomplicated: Secondary | ICD-10-CM

## 2023-08-14 LAB — CUP PACEART INCLINIC DEVICE CHECK
Battery Remaining Longevity: 33 mo
Brady Statistic RV Percent Paced: 0 %
Date Time Interrogation Session: 20240827092026
HighPow Impedance: 82.125
Implantable Lead Connection Status: 753985
Implantable Lead Implant Date: 20190122
Implantable Lead Location: 753860
Implantable Pulse Generator Implant Date: 20190122
Lead Channel Impedance Value: 425 Ohm
Lead Channel Pacing Threshold Amplitude: 0.75 V
Lead Channel Pacing Threshold Amplitude: 0.75 V
Lead Channel Pacing Threshold Pulse Width: 0.5 ms
Lead Channel Pacing Threshold Pulse Width: 0.5 ms
Lead Channel Sensing Intrinsic Amplitude: 6.9 mV
Lead Channel Setting Pacing Amplitude: 2 V
Lead Channel Setting Pacing Pulse Width: 0.5 ms
Lead Channel Setting Sensing Sensitivity: 0.5 mV
Pulse Gen Serial Number: 7376107
Zone Setting Status: 755011

## 2023-08-14 NOTE — Patient Instructions (Addendum)
Medication Instructions:    Your physician recommends that you continue on your current medications as directed. Please refer to the Current Medication list given to you today.   *If you need a refill on your cardiac medications before your next appointment, please call your pharmacy*   Lab Work: TSH TODAY   If you have labs (blood work) drawn today and your tests are completely normal, you will receive your results only by: MyChart Message (if you have MyChart) OR A paper copy in the mail If you have any lab test that is abnormal or we need to change your treatment, we will call you to review the results.   Testing/Procedures: NONE ORDERED  TODAY     Follow-Up: At [facility], you and your health needs are our priority.  As part of our continuing mission to provide you with exceptional heart care, we have created designated Provider Care Teams.  These Care Teams include your primary Cardiologist (physician) and Advanced Practice Providers (APPs -  Physician Assistants and Nurse Practitioners) who all work together to provide you with the care you need, when you need it.  We recommend signing up for the patient portal called "MyChart".  Sign up information is provided on this After Visit Summary.  MyChart is used to connect with patients for Virtual Visits (Telemedicine).  Patients are able to view lab/test results, encounter notes, upcoming appointments, etc.  Non-urgent messages can be sent to your provider as well.   To learn more about what you can do with MyChart, go to [email or web address].    Your next appointment:   3-4  month(s) ( CONTACT [facility] FOR EP SCHEDULING ISSUES )   Provider:   Loman Brooklyn, MD    Other Instructions

## 2023-08-15 ENCOUNTER — Other Ambulatory Visit: Payer: Self-pay

## 2023-08-15 ENCOUNTER — Encounter: Payer: Self-pay | Admitting: Critical Care Medicine

## 2023-08-15 ENCOUNTER — Ambulatory Visit (INDEPENDENT_AMBULATORY_CARE_PROVIDER_SITE_OTHER): Payer: 59 | Admitting: Infectious Disease

## 2023-08-15 ENCOUNTER — Encounter: Payer: Self-pay | Admitting: Infectious Disease

## 2023-08-15 VITALS — BP 85/56 | HR 74 | Temp 97.9°F | Wt 205.0 lb

## 2023-08-15 DIAGNOSIS — F17200 Nicotine dependence, unspecified, uncomplicated: Secondary | ICD-10-CM

## 2023-08-15 DIAGNOSIS — I5022 Chronic systolic (congestive) heart failure: Secondary | ICD-10-CM

## 2023-08-15 DIAGNOSIS — D582 Other hemoglobinopathies: Secondary | ICD-10-CM

## 2023-08-15 DIAGNOSIS — F319 Bipolar disorder, unspecified: Secondary | ICD-10-CM | POA: Diagnosis not present

## 2023-08-15 DIAGNOSIS — E785 Hyperlipidemia, unspecified: Secondary | ICD-10-CM

## 2023-08-15 DIAGNOSIS — Z95810 Presence of automatic (implantable) cardiac defibrillator: Secondary | ICD-10-CM | POA: Diagnosis not present

## 2023-08-15 DIAGNOSIS — B20 Human immunodeficiency virus [HIV] disease: Secondary | ICD-10-CM | POA: Diagnosis not present

## 2023-08-15 DIAGNOSIS — R7981 Abnormal blood-gas level: Secondary | ICD-10-CM

## 2023-08-15 DIAGNOSIS — D709 Neutropenia, unspecified: Secondary | ICD-10-CM

## 2023-08-15 LAB — TSH: TSH: 0.797 u[IU]/mL (ref 0.450–4.500)

## 2023-08-15 MED ORDER — TRIAMCINOLONE ACETONIDE 0.5 % EX OINT: 1.0000 | TOPICAL_OINTMENT | Freq: Two times a day (BID) | CUTANEOUS | 3 refills | Status: DC

## 2023-08-15 MED ORDER — BIKTARVY 50-200-25 MG PO TABS
1.0000 | ORAL_TABLET | Freq: Every day | ORAL | 11 refills | Status: AC
Start: 2023-08-15 — End: ?

## 2023-08-15 NOTE — Progress Notes (Signed)
Subjective:  Chief complaint: Follow-up for HIV disease on medications  Patient ID: Ryan Long, male    DOB: 1961-05-01, 62 y.o.   MRN: [id number]  HPI  62 year old man with HIV, host of other medical problems including nonischemic cardiomyopathy, ICD placement prior history of polydrug substance abuse.  He has been highly adherent to his Biktarvy and remained virologically suppressed.  He was recently seen at [facility] otology oncology for workup for elevated hemoglobin and reduced neutrophil count.  Carbon monoxide level was checked and was at 10.9 and elevated and I think this certainly does explain his elevated hemoglobin.  He is followed also closely by electrophysiology and cardiology.  He has no complaints today other than at times he has had problems with a rash for which he requests triamcinolone as needed     Past Medical History:  Diagnosis Date   Active smoker    AICD (automatic cardioverter/defibrillator) present    Alcohol abuse    Allergy July 2023   Anxiety    AR (allergic rhinitis)    Bipolar 1 disorder (HCC)    Cervical lymphadenitis 04/20/2021   CHF (congestive heart failure) (HCC)    Chronic bronchitis (HCC)    Chronic systolic heart failure (HCC)    Controlled substance agreement signed 10/22/2018   COPD (chronic obstructive pulmonary disease) (HCC)    Cough 12/31/2018   Crack cocaine use    Depression    Genital herpes    HIV (human immunodeficiency virus infection) (HCC) dx'd 08/1993   HLD (hyperlipidemia)    Hypertension    NICM (nonischemic cardiomyopathy) (HCC)    Echocardiogram 06/28/11: EF 30-35%, mild MR, mild LAE;  No CAD by coronary CT angiogram 3/12 at [facility]   NSVT (nonsustained ventricular tachycardia) (HCC)    PTSD (post-traumatic stress disorder)     Past Surgical History:  Procedure Laterality Date   CARDIAC DEFIBRILLATOR PLACEMENT  01/08/2018   ICD IMPLANT N/A 01/08/2018   Procedure: ICD  IMPLANT;  Surgeon: Regan Lemming, MD;  Location: [facility] INVASIVE CV LAB;  Service: Cardiovascular;  Laterality: N/A;    Family History  Problem Relation Age of Onset   Glaucoma Mother    Mental illness Mother    Vision loss Mother    Hypertension Father    CAD Father    Mental illness Father    Alcohol abuse Father    Drug abuse Father    Heart attack Father 31   Early death Father    Heart disease Father    Alcohol abuse Brother    Drug abuse Brother    Drug abuse Brother       Social History   Socioeconomic History   Marital status: Single    Spouse name: Not on file   Number of children: Not on file   Years of education: Not on file   Highest education level: GED or equivalent  Occupational History   Not on file  Tobacco Use   Smoking status: Every Day    Current packs/day: 0.75    Average packs/day: 0.8 packs/day for 42.0 years (31.5 ttl pk-yrs)    Types: Cigarettes   Smokeless tobacco: Never   Tobacco comments:    hoping welbutrin will help  Vaping Use   Vaping status: Never Used  Substance and Sexual Activity   Alcohol use: Not Currently    Alcohol/week: 6.0 standard drinks of alcohol    Comment: 01/08/2018 "stopped 06/2017"   Drug  use: Not Currently    Types: "Crack" cocaine    Comment: 01/08/2018 "stopped 06/2017"   Sexual activity: Yes    Birth control/protection: Condom    Comment: pt. declined condoms  Other Topics Concern   Not on file  Social History Narrative   Not on file   Social Determinants of Health   Financial Resource Strain: Low Risk  (07/17/2023)   Overall Financial Resource Strain (CARDIA)    Difficulty of Paying Living Expenses: Not hard at all  Food Insecurity: No Food Insecurity (07/17/2023)   Hunger Vital Sign    Worried About Running Out of Food in the Last Year: Never true    Ran Out of Food in the Last Year: Never true  Transportation Needs: No Transportation Needs (07/17/2023)   PRAPARE - Scientist, research (physical sciences) (Medical): No    Lack of Transportation (Non-Medical): No  Physical Activity: Insufficiently Active (07/17/2023)   Exercise Vital Sign    Days of Exercise per Week: 3 days    Minutes of Exercise per Session: 20 min  Stress: Stress Concern Present (07/17/2023)   [employer] of Occupational Health - Occupational Stress Questionnaire    Feeling of Stress : To some extent  Social Connections: Moderately Integrated (07/17/2023)   Social Connection and Isolation Panel [NHANES]    Frequency of Communication with Friends and Family: Twice a week    Frequency of Social Gatherings with Friends and Family: More than three times a week    Attends Religious Services: 1 to 4 times per year    Active Member of [employer] or Organizations: Yes    Attends Engineer, structural: More than 4 times per year    Marital Status: Never married    No Known Allergies   Current Outpatient Medications:    albuterol (VENTOLIN HFA) 108 (90 Base) MCG/ACT inhaler, Inhale 2 puffs into the lungs every 6 (six) hours as needed for wheezing or shortness of breath., Disp: 6.7 g, Rfl: 3   amiodarone (PACERONE) 200 MG tablet, Take by mouth., Disp: , Rfl:    aspirin EC 81 MG tablet, Take 81 mg by mouth daily. Swallow whole., Disp: , Rfl:    azelastine (ASTELIN) 0.1 % nasal spray, Place 2 sprays into both nostrils 2 (two) times daily. Use in each nostril as directed, Disp: 30 mL, Rfl: 12   bictegravir-emtricitabine-tenofovir AF (BIKTARVY) 50-200-25 MG TABS tablet, TAKE 1 TABLET BY MOUTH DAILY, Disp: 30 tablet, Rfl: 0   empagliflozin (JARDIANCE) 10 MG TABS tablet, Take 1 tablet (10 mg total) by mouth daily before breakfast., Disp: 30 tablet, Rfl: 11   eplerenone (INSPRA) 25 MG tablet, TAKE 0.5 TABLET BY MOUTH EVERY DAY, Disp: 45 tablet, Rfl: 6   finasteride (PROSCAR) 5 MG tablet, TAKE 1 TABLET(5 MG) BY MOUTH DAILY, Disp: 30 tablet, Rfl: 0   fluticasone (FLONASE) 50 MCG/ACT nasal spray, Place 2 sprays into  both nostrils daily., Disp: 16 g, Rfl: 6   Melatonin 10 MG CAPS, Take 10 mg by mouth at bedtime as needed (sleep)., Disp: , Rfl:    metoprolol succinate (TOPROL-XL) 50 MG 24 hr tablet, Take 1 tablet (50 mg total) by mouth daily. For high blood pressure, Disp: 90 tablet, Rfl: 3   Multiple Vitamins-Minerals (MENS MULTIPLUS PO), Take 1 tablet by mouth daily., Disp: , Rfl:    nicotine (NICODERM CQ - DOSED IN MG/24 HOURS) 21 mg/24hr patch, , Disp: , Rfl:    potassium chloride SA (KLOR-CON M)  20 MEQ tablet, Take 1 tablet (20 mEq total) by mouth daily., Disp: 90 tablet, Rfl: 3   rosuvastatin (CRESTOR) 5 MG tablet, Take 1 tablet (5 mg total) by mouth daily., Disp: 90 tablet, Rfl: 2   sacubitril-valsartan (ENTRESTO) 24-26 MG, Take 1 tablet by mouth 2 (two) times daily., Disp: 60 tablet, Rfl: 1   sildenafil (VIAGRA) 50 MG tablet, Take 1 tablet (50 mg total) by mouth as needed for erectile dysfunction., Disp: 10 tablet, Rfl: 3   tamsulosin (FLOMAX) 0.4 MG CAPS capsule, TAKE 2 CAPSULES(0.8 MG) BY MOUTH DAILY AFTER SUPPER, Disp: 60 capsule, Rfl: 5   valACYclovir (VALTREX) 500 MG tablet, Take 1 tablet (500 mg total) by mouth daily., Disp: 60 tablet, Rfl: 4   Varenicline Tartrate, Starter, 0.5 MG X 11 & 1 MG X 42 TBPK, , Disp: , Rfl:    Review of Systems  Constitutional:  Negative for activity change, appetite change, chills, diaphoresis, fatigue, fever and unexpected weight change.  HENT:  Negative for congestion, rhinorrhea, sinus pressure, sneezing, sore throat and trouble swallowing.   Eyes:  Negative for photophobia and visual disturbance.  Respiratory:  Negative for cough, chest tightness, shortness of breath, wheezing and stridor.   Cardiovascular:  Negative for chest pain, palpitations and leg swelling.  Gastrointestinal:  Negative for abdominal distention, abdominal pain, anal bleeding, blood in stool, constipation, diarrhea, nausea and vomiting.  Genitourinary:  Negative for difficulty urinating,  dysuria, flank pain and hematuria.  Musculoskeletal:  Negative for arthralgias, back pain, gait problem, joint swelling and myalgias.  Skin:  Negative for color change, pallor, rash and wound.  Neurological:  Negative for dizziness, tremors, weakness and light-headedness.  Hematological:  Negative for adenopathy. Does not bruise/bleed easily.  Psychiatric/Behavioral:  Negative for agitation, behavioral problems, confusion, decreased concentration, dysphoric mood and sleep disturbance.        Objective:   Physical Exam Constitutional:      Appearance: He is well-developed.  HENT:     Head: Normocephalic and atraumatic.  Eyes:     Conjunctiva/sclera: Conjunctivae normal.  Cardiovascular:     Rate and Rhythm: Normal rate and regular rhythm.  Pulmonary:     Effort: Pulmonary effort is normal. No respiratory distress.     Breath sounds: No wheezing.  Abdominal:     General: There is no distension.     Palpations: Abdomen is soft.  Musculoskeletal:        General: No tenderness. Normal range of motion.     Cervical back: Normal range of motion and neck supple.  Skin:    General: Skin is warm and dry.     Coloration: Skin is not pale.     Findings: No erythema or rash.  Neurological:     General: No focal deficit present.     Mental Status: He is alert and oriented to person, place, and time.  Psychiatric:        Mood and Affect: Mood normal.        Behavior: Behavior normal.        Thought Content: Thought content normal.        Judgment: Judgment normal.           Assessment & Plan:     HIV disease:  I have reviewed Ryan Long's labs including viral load which was  Lab Results  Component Value Date   HIV1RNAQUANT Not Detected 08/03/2023   and cd4 which was  Lab Results  Component Value Date   CD4TABS  500 12/19/2022     I am continuing patient's prescription for Biktarvy,     Nonischemic cardiomyopathy atrial fibrillation ventricular tachycardia  with amiodarone on board and ICD:  He is just seen EP and going to see cardiology as well.  Slightly low blood pressure when seen here in clinic.  Would asked him to follow-up with cardiology and PCP regarding this he does not have any symptoms concerning for hypotension and is normal heart rate  Smoking and elevated carbon oxide have asked him to switch from smoking conventional cigarettes to vape pen products to rid of the carbon monoxide that he is inhaling and this would potentially help with his elevated hemoglobin.   Vaccine counseling recommended flu COVID and RSV vaccination

## 2023-08-23 ENCOUNTER — Telehealth (HOSPITAL_COMMUNITY): Payer: Self-pay

## 2023-08-23 NOTE — Telephone Encounter (Signed)
Called to confirm/remind patient of their appointment at [facility] on 08/24/23  Patient reminded to bring all medications and/or complete list.  Confirmed patient has transportation. Gave directions, instructed to utilize valet parking.  Confirmed appointment prior to ending call.

## 2023-08-24 ENCOUNTER — Encounter (HOSPITAL_COMMUNITY): Payer: 59

## 2023-08-24 ENCOUNTER — Encounter (HOSPITAL_COMMUNITY): Payer: Self-pay

## 2023-08-24 ENCOUNTER — Ambulatory Visit (HOSPITAL_COMMUNITY): Admission: RE | Admit: 2023-08-24 | Payer: 59 | Source: Ambulatory Visit

## 2023-09-03 ENCOUNTER — Telehealth: Payer: Self-pay | Admitting: *Deleted

## 2023-09-03 NOTE — Telephone Encounter (Signed)
  Chief Complaint: Results Symptoms: NA Frequency: NA Pertinent Negatives: Patient denies NA Disposition: [] ED /[] Urgent Care (no appt availability in office) / [] Appointment(In office/virtual)/ []  [facility]/ [] Home Care/ [] Refused Recommended Disposition /[] [facility]/ [x]  Follow-up with PCP Additional Notes:  'DAwn' with [facility] calling. Questioning if PCP received mailed letter regarding abnormal results from August "eGFR abnormal."  Advised would route to practice for review. CB# [phone]  Requesting verification received and will follow up.

## 2023-09-04 NOTE — Telephone Encounter (Signed)
Followed up with Dawn at [facility]. Advised that we have not received the a letter regarding patient 's abnormal labs. Dawn to fax results to [phone].

## 2023-09-04 NOTE — Telephone Encounter (Signed)
I have not seen anything regarding this.

## 2023-09-07 NOTE — Telephone Encounter (Addendum)
Call to house call follow-up fax has not been received spoke Raquel. Requel said she  faxing it again now.  Also asked that results be mailed to the offices as well. Mailing Address confirmed. Front desk aware that it is being faxed.

## 2023-09-07 NOTE — Telephone Encounter (Signed)
Fax received and given to CP

## 2023-09-08 ENCOUNTER — Other Ambulatory Visit: Payer: Self-pay | Admitting: Critical Care Medicine

## 2023-09-11 ENCOUNTER — Encounter: Payer: Self-pay | Admitting: Infectious Disease

## 2023-09-11 ENCOUNTER — Encounter: Payer: Self-pay | Admitting: Internal Medicine

## 2023-09-11 ENCOUNTER — Other Ambulatory Visit: Payer: Self-pay | Admitting: Family Medicine

## 2023-09-11 MED ORDER — NICOTINE 21 MG/24HR TD PT24: 21.0000 mg | MEDICATED_PATCH | Freq: Every day | TRANSDERMAL | 3 refills | Status: DC

## 2023-09-12 ENCOUNTER — Other Ambulatory Visit: Payer: Self-pay | Admitting: Infectious Disease

## 2023-09-12 MED ORDER — NICOTINE 21 MG/24HR TD PT24: 21.0000 mg | MEDICATED_PATCH | Freq: Every day | TRANSDERMAL | 3 refills | Status: DC

## 2023-09-12 MED ORDER — NICOTINE 14 MG/24HR TD PT24: 14.0000 mg | MEDICATED_PATCH | TRANSDERMAL | 0 refills | Status: DC

## 2023-09-12 MED ORDER — NICOTINE 7 MG/24HR TD PT24: 7.0000 mg | MEDICATED_PATCH | TRANSDERMAL | 0 refills | Status: DC

## 2023-09-18 ENCOUNTER — Telehealth (HOSPITAL_COMMUNITY): Payer: Self-pay | Admitting: Cardiology

## 2023-09-18 ENCOUNTER — Emergency Department (HOSPITAL_COMMUNITY): Payer: 59

## 2023-09-18 ENCOUNTER — Emergency Department (HOSPITAL_COMMUNITY)
Admission: EM | Admit: 2023-09-18 | Discharge: 2023-09-18 | Disposition: A | Discharge status: Home / Self Care | Payer: 59 | Attending: Emergency Medicine | Admitting: Emergency Medicine

## 2023-09-18 ENCOUNTER — Other Ambulatory Visit: Payer: Self-pay

## 2023-09-18 DIAGNOSIS — R55 Syncope and collapse: Secondary | ICD-10-CM | POA: Diagnosis present

## 2023-09-18 DIAGNOSIS — Z79899 Other long term (current) drug therapy: Secondary | ICD-10-CM | POA: Diagnosis not present

## 2023-09-18 DIAGNOSIS — Z7982 Long term (current) use of aspirin: Secondary | ICD-10-CM | POA: Diagnosis not present

## 2023-09-18 DIAGNOSIS — J449 Chronic obstructive pulmonary disease, unspecified: Secondary | ICD-10-CM | POA: Diagnosis not present

## 2023-09-18 DIAGNOSIS — B20 Human immunodeficiency virus [HIV] disease: Secondary | ICD-10-CM | POA: Diagnosis not present

## 2023-09-18 DIAGNOSIS — Z7951 Long term (current) use of inhaled steroids: Secondary | ICD-10-CM | POA: Diagnosis not present

## 2023-09-18 DIAGNOSIS — I509 Heart failure, unspecified: Secondary | ICD-10-CM | POA: Insufficient documentation

## 2023-09-18 DIAGNOSIS — I110 Hypertensive heart disease with heart failure: Secondary | ICD-10-CM | POA: Insufficient documentation

## 2023-09-18 LAB — URINALYSIS, ROUTINE W REFLEX MICROSCOPIC
Bacteria, UA: NONE SEEN
Bilirubin Urine: NEGATIVE
Glucose, UA: >=500 mg/dL — AB
Hgb urine dipstick: NEGATIVE
Ketones, ur: NEGATIVE mg/dL
Leukocytes,Ua: NEGATIVE
Nitrite: NEGATIVE
Protein, ur: NEGATIVE mg/dL
Specific Gravity, Urine: 1.013 (ref 1.005–1.030)
pH: 5 (ref 5.0–8.0)

## 2023-09-18 LAB — CBG MONITORING, ED: Glucose-Capillary: 105 mg/dL — ABNORMAL HIGH (ref 70–99)

## 2023-09-18 MED ORDER — LACTATED RINGERS IV BOLUS: 500.0000 mL | Freq: Once | INTRAVENOUS | Status: DC

## 2023-09-18 MED ORDER — TETANUS-DIPHTH-ACELL PERTUSSIS 5-2.5-18.5 LF-MCG/0.5 IM SUSY: 0.5000 mL | PREFILLED_SYRINGE | Freq: Once | INTRAMUSCULAR | Status: DC

## 2023-09-18 NOTE — ED Notes (Signed)
Pt. Assisted to bathroom in wheelchair for safety. Pt. Ambulated back to his room with safety with tech.

## 2023-09-18 NOTE — ED Provider Triage Note (Cosign Needed)
Emergency Medicine Provider Triage Evaluation Note  Ryan Long , a 62 y.o. male  was evaluated in triage.  Pt complains of syncope. Pt had a syncopal episode, and fell.  Here with headache, neck pain, L shoulder pain and RLE pain. Not on blood thinner  Review of Systems  Positive: As above Negative: As above  Physical Exam  There were no vitals taken for this visit. Gen:   Awake, no distress   Resp:  Normal effort  MSK:   Moves extremities without difficulty  Other:    Medical Decision Making  Medically screening exam initiated at 12:32 PM.  Appropriate orders placed.  Ryan Long was informed that the remainder of the evaluation will be completed by another provider, this initial triage assessment does not replace that evaluation, and the importance of remaining in the ED until their evaluation is complete.     Fayrene Helper, PA-C 09/18/23 1233

## 2023-09-18 NOTE — Telephone Encounter (Signed)
Patient called to report syncopal episode about 1 hour ago. Reports he was walking out of the house and remembers waking up on the ground Was told by friend he passed out. Reports scrapes and cuts on legs   Not vitals to report Requests add on appt for today Advised no appt are available for today however should be evaluated soon for syncopal episode   Requests appt 10/2 Again advised no add on appts are available at this time and should be evaluated ER or urgent care   Will forward to provider for additional input   ?device check, labs etc

## 2023-09-18 NOTE — Discharge Instructions (Addendum)
Given your heart history, there is concern that you passed out due to a possible deadly arrhythmia or some other severe condition.  Realize that by leaving AGAINST MEDICAL ADVICE, you do risk disability and/or death for an untreated condition.  Please return to the emergency department at any time if you change your mind about getting evaluated.  If you do not return, please follow-up with your cardiologist.  If you do not hear from their office in the next couple days, call the number below.

## 2023-09-18 NOTE — ED Triage Notes (Signed)
Pt BIB EMS from home, c/o loss of consciousness for 10 seconds, neck pain, left shoulder pain, and headache. Sustained abrasion to right leg. Pt reportedly doubled up on lasix x 2 days. Prescribed 20mg  but took 40mg   BP 108/73 P 77 SpO2 98% CBG 199

## 2023-09-18 NOTE — ED Provider Notes (Signed)
[place] EMERGENCY DEPARTMENT AT [facility] Provider Note   CSN: [id number] Arrival date & time: 09/18/23  1221     History  Chief Complaint  Patient presents with   Loss of Consciousness    Ryan Long is a 62 y.o. male.  HPI Patient presents for syncope.  Medical history includes HIV, HTN, bipolar disorder, COPD, depression, history of substance abuse, NICM, AICD placement, CHF.  He has been adherent to his home indications.  Over the past 2 days, he doubled his dose of home Lasix.  He stated that, before he doubled his dose, he was peeing a lot and he thought this meant he had fluid overload.  He denies any recent increase swelling or shortness of breath.  Earlier this morning, he was in his normal state of health.  Shortly prior to arrival, he was standing on his deck.  He began to feel lightheaded and dizzy.  He had a syncopal episode and fell to the ground.  Bystanders report that he had 10 to 15 seconds LOC.  Since that time, he has been ambulatory.  He denies any persistent dizziness or shortness of breath.  He has had some mild pain to left frontal scalp, right side of neck, and left shoulder.  He sustained a small scrape to his right lateral lower leg.  He denies any other suspected injuries.  He did not take any Lasix today.  He did take his other medications, including his blood pressure medications.  He is not on anticoagulation.    Home Medications Prior to Admission medications   Medication Sig Start Date End Date Taking? Authorizing Provider  albuterol (VENTOLIN HFA) 108 (90 Base) MCG/ACT inhaler Inhale 2 puffs into the lungs every 6 (six) hours as needed for wheezing or shortness of breath. 07/30/23   Storm Frisk, MD  amiodarone (PACERONE) 200 MG tablet Take by mouth. 07/08/23   [provider]  aspirin EC 81 MG tablet Take 81 mg by mouth daily. Swallow whole.    [provider]  azelastine (ASTELIN) 0.1 % nasal spray Place 2  sprays into both nostrils 2 (two) times daily. Use in each nostril as directed 01/23/23   Storm Frisk, MD  bictegravir-emtricitabine-tenofovir AF (BIKTARVY) 50-200-25 MG TABS tablet Take 1 tablet by mouth daily. 08/15/23   Randall Hiss, MD  empagliflozin (JARDIANCE) 10 MG TABS tablet Take 1 tablet (10 mg total) by mouth daily before breakfast. 12/01/22   Jacklynn Ganong, FNP  eplerenone (INSPRA) 25 MG tablet TAKE 0.5 TABLET BY MOUTH EVERY DAY 02/05/23   Storm Frisk, MD  finasteride (PROSCAR) 5 MG tablet TAKE 1 TABLET(5 MG) BY MOUTH DAILY 03/15/23   Storm Frisk, MD  fluticasone [facility]) 50 MCG/ACT nasal spray SHAKE LIQUID AND USE 2 SPRAYS IN [facility] NOSTRIL DAILY 09/10/23   Marcine Matar, MD  Melatonin 10 MG CAPS Take 10 mg by mouth at bedtime as needed (sleep).    [provider]  metoprolol succinate (TOPROL-XL) 50 MG 24 hr tablet Take 1 tablet (50 mg total) by mouth daily. For high blood pressure 12/18/22   Storm Frisk, MD  Multiple Vitamins-Minerals (MENS MULTIPLUS PO) Take 1 tablet by mouth daily.    [provider]  nicotine (NICODERM CQ - DOSED IN MG/24 HOURS) 14 mg/24hr patch Place 1 patch (14 mg total) onto the skin daily. 09/12/23   Randall Hiss, MD  nicotine (NICODERM CQ - DOSED IN MG/24  HOURS) 21 mg/24hr patch Place 1 patch (21 mg total) onto the skin daily. 09/12/23   Randall Hiss, MD  nicotine (NICODERM CQ - DOSED IN MG/24 HR) 7 mg/24hr patch Place 1 patch (7 mg total) onto the skin daily. 09/12/23   Randall Hiss, MD  potassium chloride SA (KLOR-CON M) 20 MEQ tablet Take 1 tablet (20 mEq total) by mouth daily. 11/15/22   Storm Frisk, MD  rosuvastatin (CRESTOR) 5 MG tablet Take 1 tablet (5 mg total) by mouth daily. 01/22/23   Storm Frisk, MD  sacubitril-valsartan (ENTRESTO) 24-26 MG Take 1 tablet by mouth 2 (two) times daily. 06/06/22   Kuppelweiser, Cassie L, RPH-CPP  sildenafil (VIAGRA) 50 MG tablet Take 1  tablet (50 mg total) by mouth as needed for erectile dysfunction. 11/15/22   Storm Frisk, MD  tamsulosin (FLOMAX) 0.4 MG CAPS capsule TAKE 2 CAPSULES(0.8 MG) BY MOUTH DAILY AFTER SUPPER 07/26/23   Storm Frisk, MD  triamcinolone ointment (KENALOG) 0.5 % Apply 1 Application topically 2 (two) times daily. 08/15/23   Storm Frisk, MD  valACYclovir (VALTREX) 500 MG tablet Take 1 tablet (500 mg total) by mouth daily. 11/15/22   Storm Frisk, MD  Varenicline Tartrate, Starter, 0.5 MG X 11 & 1 MG X 42 TBPK  01/26/22   [provider]      Allergies    Patient has no known allergies.    Review of Systems   Review of Systems  Musculoskeletal:  Positive for arthralgias.  Skin:  Positive for wound.  Neurological:  Positive for dizziness, syncope, light-headedness and headaches.  All other systems reviewed and are negative.   Physical Exam Updated Vital Signs BP 106/67 (BP Location: Right Arm)   Pulse 70   Temp 98.2 F (36.8 C) (Oral)   Resp 18   SpO2 96%  Physical Exam Vitals and nursing note reviewed.  Constitutional:      General: He is not in acute distress.    Appearance: Normal appearance. He is well-developed. He is not ill-appearing, toxic-appearing or diaphoretic.  HENT:     Head: Normocephalic and atraumatic.     Right Ear: External ear normal.     Left Ear: External ear normal.     Nose: Nose normal.     Mouth/Throat:     Mouth: Mucous membranes are moist.  Eyes:     Extraocular Movements: Extraocular movements intact.     Conjunctiva/sclera: Conjunctivae normal.  Neck:     Comments: Cervical collar in place Cardiovascular:     Rate and Rhythm: Normal rate and regular rhythm.     Heart sounds: No murmur heard. Pulmonary:     Effort: Pulmonary effort is normal. No respiratory distress.     Breath sounds: No wheezing or rales.  Chest:     Chest wall: No tenderness.  Abdominal:     General: There is no distension.     Palpations: Abdomen is  soft.     Tenderness: There is no abdominal tenderness.  Musculoskeletal:        General: No swelling or deformity. Normal range of motion.     Cervical back: Neck supple.     Right lower leg: No edema.     Left lower leg: No edema.  Skin:    General: Skin is warm and dry.     Coloration: Skin is not jaundiced or pale.  Neurological:     General: No focal deficit present.  Mental Status: He is alert and oriented to person, place, and time.     Cranial Nerves: No cranial nerve deficit.     Sensory: No sensory deficit.     Motor: No weakness.     Coordination: Coordination normal.  Psychiatric:        Mood and Affect: Mood normal.        Behavior: Behavior normal.     ED Results / Procedures / Treatments   Labs (all labs ordered are listed, but only abnormal results are displayed) Labs Reviewed  CBG MONITORING, ED - Abnormal; Notable for the following components:      Result Value   Glucose-Capillary 105 (*)    All other components within normal limits  BASIC METABOLIC PANEL  CBC  URINALYSIS, ROUTINE W REFLEX MICROSCOPIC  MAGNESIUM  TROPONIN I (HIGH SENSITIVITY)    EKG EKG Interpretation Date/Time:  Tuesday September 18 2023 12:41:40 EDT Ventricular Rate:  69 PR Interval:  238 QRS Duration:  133 QT Interval:  429 QTC Calculation: 460 R Axis:   47  Text Interpretation: Sinus rhythm Ventricular trigeminy Prolonged PR interval Consider left atrial enlargement Left bundle branch block Confirmed by Gloris Manchester ([id number]) on 09/18/2023 1:07:40 PM  Radiology No results found.  Procedures Procedures    Medications Ordered in ED Medications  Tdap (BOOSTRIX) injection 0.5 mL (has no administration in time range)  lactated ringers bolus 500 mL (has no administration in time range)    ED Course/ Medical Decision Making/ A&P                                 Medical Decision Making Amount and/or Complexity of Data Reviewed Labs: ordered. Radiology:  ordered. ECG/medicine tests: ordered.   This patient presents to the ED for concern of syncope, this involves an extensive number of treatment options, and is a complaint that carries with it a high risk of complications and morbidity.  The differential diagnosis includes arrhythmia, polypharmacy, vasovagal episode, dehydration, anemia, electrolyte derangements   Co morbidities that complicate the patient evaluation  HIV, HTN, bipolar disorder, COPD, depression, history of substance abuse, NICM, AICD placement, CHF   Additional history obtained:  Additional history obtained from N/A External records from outside source obtained and reviewed including EMR   Lab Tests:  I Ordered, and personally interpreted labs.  The pertinent results include: Normal CBG, patient left AMA prior to further lab work results   Imaging Studies ordered:  I ordered imaging studies including chest x-ray, CT head, CT cervical spine I independently visualized and interpreted imaging which showed (patient left AMA prior to imaging studies) I agree with the radiologist interpretation   Cardiac Monitoring: / EKG:  The patient was maintained on a cardiac monitor.  I personally viewed and interpreted the cardiac monitored which showed an underlying rhythm of: Sinus rhythm  Problem List / ED Course / Critical interventions / Medication management  Patient presents after a syncopal episode.  On arrival, he is alert and oriented.  He arrives via EMS with cervical collar in place.  He has no focal neurologic deficits.  Breathing is unlabored.  Lungs are clear to auscultation.  He has no abdominal or chest tenderness.  He has some mild tenderness to left frontal scalp without breakage of skin or hematoma.  He has a very superficial abrasion to his right lower lateral leg.  Although he has had some pain in his left  shoulder, range of motion is intact.  Patient has significant cardiac history including placement of  AICD.  Workup initiated to assess for high risk syncopal episode.  Will also evaluate for possible acute injuries.  Unfortunately, prior to workup, patient stated that he would like to leave AMA.  He was counseled on risk of death or disability from untreated severe conditions that may have led to a syncopal episode.  He understands.  He does have capacity.  He was not able to be talked into staying.  He was advised to return at any time if he does change his mind. I ordered medication including IV fluids for hydration Reevaluation of the patient after these medicines showed that the patient  left prior to fluids being given I have reviewed the patients home medicines and have made adjustments as needed   Social Determinants of Health:  Has access to outpatient care        Final Clinical Impression(s) / ED Diagnoses Final diagnoses:  Syncope and collapse    Rx / DC Orders ED Discharge Orders          Ordered    Ambulatory referral to Cardiology       Comments: If you have not heard from the Cardiology office within the next 72 hours please call [phone].   09/18/23 1305              Gloris Manchester, MD 09/18/23 1309

## 2023-09-18 NOTE — Telephone Encounter (Signed)
Pt aware.

## 2023-09-27 ENCOUNTER — Telehealth (HOSPITAL_COMMUNITY): Payer: Self-pay

## 2023-09-27 NOTE — Telephone Encounter (Signed)
Called to confirm/remind patient of their appointment at the [facility] on 09/28/23.   Patient reminded to bring all medications and/or complete list.  Confirmed patient has transportation. Gave directions, instructed to utilize valet parking.  Confirmed appointment prior to ending call.

## 2023-09-28 ENCOUNTER — Encounter (HOSPITAL_COMMUNITY): Payer: Self-pay

## 2023-09-28 ENCOUNTER — Ambulatory Visit (HOSPITAL_COMMUNITY)
Admission: RE | Admit: 2023-09-28 | Discharge: 2023-09-28 | Disposition: A | Discharge status: Home / Self Care | Payer: 59 | Source: Ambulatory Visit | Attending: Family Medicine | Admitting: Family Medicine

## 2023-09-28 ENCOUNTER — Ambulatory Visit (HOSPITAL_BASED_OUTPATIENT_CLINIC_OR_DEPARTMENT_OTHER)
Admission: RE | Admit: 2023-09-28 | Discharge: 2023-09-28 | Disposition: A | Discharge status: Home / Self Care | Payer: 59 | Source: Ambulatory Visit | Attending: Internal Medicine | Admitting: Internal Medicine

## 2023-09-28 ENCOUNTER — Telehealth (HOSPITAL_COMMUNITY): Payer: Self-pay | Admitting: Family Medicine

## 2023-09-28 VITALS — BP 102/60 | HR 89 | Wt 205.0 lb

## 2023-09-28 DIAGNOSIS — Z7984 Long term (current) use of oral hypoglycemic drugs: Secondary | ICD-10-CM | POA: Diagnosis not present

## 2023-09-28 DIAGNOSIS — Z91199 Patient's noncompliance with other medical treatment and regimen due to unspecified reason: Secondary | ICD-10-CM | POA: Diagnosis not present

## 2023-09-28 DIAGNOSIS — F1410 Cocaine abuse, uncomplicated: Secondary | ICD-10-CM | POA: Diagnosis not present

## 2023-09-28 DIAGNOSIS — F17210 Nicotine dependence, cigarettes, uncomplicated: Secondary | ICD-10-CM | POA: Diagnosis not present

## 2023-09-28 DIAGNOSIS — Z79899 Other long term (current) drug therapy: Secondary | ICD-10-CM | POA: Diagnosis not present

## 2023-09-28 DIAGNOSIS — F1411 Cocaine abuse, in remission: Secondary | ICD-10-CM

## 2023-09-28 DIAGNOSIS — I5022 Chronic systolic (congestive) heart failure: Secondary | ICD-10-CM

## 2023-09-28 DIAGNOSIS — I4729 Other ventricular tachycardia: Secondary | ICD-10-CM | POA: Diagnosis not present

## 2023-09-28 DIAGNOSIS — Z720 Tobacco use: Secondary | ICD-10-CM

## 2023-09-28 DIAGNOSIS — I130 Hypertensive heart and chronic kidney disease with heart failure and stage 1 through stage 4 chronic kidney disease, or unspecified chronic kidney disease: Secondary | ICD-10-CM | POA: Diagnosis not present

## 2023-09-28 DIAGNOSIS — R55 Syncope and collapse: Secondary | ICD-10-CM | POA: Diagnosis not present

## 2023-09-28 DIAGNOSIS — I110 Hypertensive heart disease with heart failure: Secondary | ICD-10-CM | POA: Diagnosis present

## 2023-09-28 DIAGNOSIS — I472 Ventricular tachycardia, unspecified: Secondary | ICD-10-CM | POA: Diagnosis not present

## 2023-09-28 DIAGNOSIS — I428 Other cardiomyopathies: Secondary | ICD-10-CM | POA: Insufficient documentation

## 2023-09-28 DIAGNOSIS — Z95810 Presence of automatic (implantable) cardiac defibrillator: Secondary | ICD-10-CM

## 2023-09-28 DIAGNOSIS — Z21 Asymptomatic human immunodeficiency virus [HIV] infection status: Secondary | ICD-10-CM

## 2023-09-28 LAB — BRAIN NATRIURETIC PEPTIDE: B Natriuretic Peptide: 189.3 pg/mL — ABNORMAL HIGH (ref 0.0–100.0)

## 2023-09-28 LAB — BASIC METABOLIC PANEL
Anion gap: 13 (ref 5–15)
BUN: 13 mg/dL (ref 8–23)
CO2: 21 mmol/L — ABNORMAL LOW (ref 22–32)
Calcium: 9.1 mg/dL (ref 8.9–10.3)
Chloride: 109 mmol/L (ref 98–111)
Creatinine, Ser: 1.55 mg/dL — ABNORMAL HIGH (ref 0.61–1.24)
GFR, Estimated: 50 mL/min — ABNORMAL LOW (ref >60)
Glucose, Bld: 102 mg/dL — ABNORMAL HIGH (ref 70–99)
Potassium: 4 mmol/L (ref 3.5–5.1)
Sodium: 143 mmol/L (ref 135–145)

## 2023-09-28 LAB — ECHOCARDIOGRAM COMPLETE
AR max vel: 2.45 cm2
AV Area VTI: 2.78 cm2
AV Area mean vel: 2.46 cm2
AV Mean grad: 5 mm[Hg]
AV Peak grad: 9 mm[Hg]
Ao pk vel: 1.5 m/s
Area-P 1/2: 3.74 cm2
S' Lateral: 5.2 cm

## 2023-09-28 MED ORDER — AZELASTINE HCL 0.1 % NA SOLN: 2.0000 | Freq: Two times a day (BID) | NASAL | 0 refills | Status: DC

## 2023-09-28 NOTE — Telephone Encounter (Signed)
Patient received ATP on 09/18/23 for VF. Discussed with Francis Dowse, PA with EP. Will increase amio to 200 mg bid x 2 weeks and EP clinic will call him for a sooner appt. I called patient and left a detailed message.   Prince Rome, FNP-BC 09/28/23

## 2023-09-28 NOTE — Progress Notes (Signed)
[facility] Note   Patient ID: Ryan Long, male   DOB: 02-23-1961, 62 y.o.   MRN: [id number] ID: Dr Daiva Eves PCP: Dr Danise Mina EP : Dr Elberta Fortis  HF: Dr Launa Grill Garcia Carrisalez is a 62 y.o. male with HIV, HTN, major depression, chronic systolic heart failure (nonischemic cardiomyopathy), and polysubstance abuse.    Coronary CT 3/12 -> no CAD.  Cardiolite in 2015 showed no ischemia or infarction.  NICM was not felt to be related to HIV given his good CD4 counts. He is S/P St Jude ICD 12/2017.  Echo 09/2017 EF 25-30%   He was evaluated [facility] 1/19 with suicidal ideation. He was placed on IVC. He was later cleared and discharged.   Echo 04/20/21 EF 20-25% mild MR. RV ok   In 5/22 referred for CPX test to objectively quantify exercise capacity. Unfortunately, he did not complete study and was lost to f/u  Saw EP 9/22 and device interrogation showed NSVT. Seen in ED 10/22 after defib fired x 2. Interrogation showed WCT ?VT. Cards consulted and recommended starting amiodarone, but he left AMA.  Seen in HF clinic in 12/13 for first time since 5/22. Doing ok. Echo ordered but he did not f/u.  Seen in ED 1/24 with dizziness and diaphoresis after using cocaine.   Seen in ED 09/18/23 with syncope, left AMA.  Today he returns for HF follow up. Overall feeling fine. Says he doubled up on his Lasix day of syncopal event and thinks he over did-it. Breathing ok, has bronchitis right now and on Z pak. Denies palpitations, CP, dizziness, edema, or PND/Orthopnea. Appetite ok. No fever or chills. Weight at home 205-208 pounds. Taking all medications. Drinks electrolyte replacement drinks daily. Smoking 10-12 cigs/day, no ETOH, last used cocaine 02/2022. Taking Lasix daily.  Echo today 09/28/23, personally reviewed with Dr. Elwyn Lade, EF appears ~ 20%, RV ok  Cardiac Studies: - Echo (5/22): EF 20-25%, mild MR, RV ok  - Echo (10/18): EF 25-30%  - Echo (9/16): EF 30-35%,  diffuse hypokinesis, grade I diastolic dysfunction  - Echo (7/82): EF 35-40%  Past Medical History:  Diagnosis Date   Active smoker    AICD (automatic cardioverter/defibrillator) present    Alcohol abuse    Allergy July 2023   Anxiety    AR (allergic rhinitis)    Bipolar 1 disorder (HCC)    Cervical lymphadenitis 04/20/2021   CHF (congestive heart failure) (HCC)    Chronic bronchitis (HCC)    Chronic systolic heart failure (HCC)    Controlled substance agreement signed 10/22/2018   COPD (chronic obstructive pulmonary disease) (HCC)    Cough 12/31/2018   Crack cocaine use    Depression    Genital herpes    HIV (human immunodeficiency virus infection) (HCC) dx'd 08/1993   HLD (hyperlipidemia)    Hypertension    NICM (nonischemic cardiomyopathy) (HCC)    Echocardiogram 06/28/11: EF 30-35%, mild MR, mild LAE;  No CAD by coronary CT [facility] 3/[facility]   NSVT (nonsustained ventricular tachycardia) (HCC)    PTSD (post-traumatic stress disorder)    Current Outpatient Medications  Medication Sig Dispense Refill   amiodarone (PACERONE) 200 MG tablet Take 200 mg by mouth daily.     aspirin EC 81 MG tablet Take 81 mg by mouth daily. Swallow whole.     bictegravir-emtricitabine-tenofovir AF (BIKTARVY) 50-200-25 MG TABS tablet Take 1 tablet by mouth daily. 30 tablet 11   empagliflozin (  JARDIANCE) 10 MG TABS tablet Take 1 tablet (10 mg total) by mouth daily before breakfast. 30 tablet 11   eplerenone (INSPRA) 25 MG tablet TAKE 0.5 TABLET BY MOUTH EVERY DAY 45 tablet 6   finasteride (PROSCAR) 5 MG tablet TAKE 1 TABLET(5 MG) BY MOUTH DAILY 30 tablet 0   furosemide (LASIX) 20 MG tablet Take 20 mg by mouth as needed.     levocetirizine (XYZAL) 5 MG tablet Take 5 mg by mouth as needed for allergies.     Melatonin 10 MG CAPS Take 10 mg by mouth at bedtime as needed (sleep).     metoprolol succinate (TOPROL-XL) 50 MG 24 hr tablet Take 1 tablet (50 mg total) by mouth  daily. For high blood pressure 90 tablet 3   Multiple Vitamins-Minerals (MENS MULTIPLUS PO) Take 1 tablet by mouth daily.     nicotine (NICODERM CQ - DOSED IN MG/24 HOURS) 21 mg/24hr patch Place 1 patch (21 mg total) onto the skin daily. 28 patch 3   potassium chloride SA (KLOR-CON M) 20 MEQ tablet Take 1 tablet (20 mEq total) by mouth daily. 90 tablet 3   rosuvastatin (CRESTOR) 5 MG tablet Take 1 tablet (5 mg total) by mouth daily. 90 tablet 2   sacubitril-valsartan (ENTRESTO) 24-26 MG Take 1 tablet by mouth 2 (two) times daily. 60 tablet 1   sildenafil (VIAGRA) 50 MG tablet Take 1 tablet (50 mg total) by mouth as needed for erectile dysfunction. 10 tablet 3   tamsulosin (FLOMAX) 0.4 MG CAPS capsule TAKE 2 CAPSULES(0.8 MG) BY MOUTH DAILY AFTER SUPPER 60 capsule 5   triamcinolone ointment (KENALOG) 0.5 % Apply 1 Application topically 2 (two) times daily. 30 g 3   valACYclovir (VALTREX) 500 MG tablet Take 1 tablet (500 mg total) by mouth daily. 60 tablet 4   albuterol (VENTOLIN HFA) 108 (90 Base) MCG/ACT inhaler Inhale 2 puffs into the lungs every 6 (six) hours as needed for wheezing or shortness of breath. 6.7 g 3   azelastine (ASTELIN) 0.1 % nasal spray Place 2 sprays into both nostrils 2 (two) times daily. Use in each nostril as directed 30 mL 12   nicotine (NICODERM CQ - DOSED IN MG/24 HOURS) 14 mg/24hr patch Place 1 patch (14 mg total) onto the skin daily. (Patient not taking: Reported on 09/28/2023) 14 patch 0   nicotine (NICODERM CQ - DOSED IN MG/24 HR) 7 mg/24hr patch Place 1 patch (7 mg total) onto the skin daily. (Patient not taking: Reported on 09/28/2023) 7 patch 0   No current facility-administered medications for this encounter.   Allergies: No Known Allergies   Review of systems complete and found to be negative unless listed in HPI.    BP 102/60   Pulse 89   Wt 93 kg (205 lb)   SpO2 95%   BMI 30.27 kg/m   Wt Readings from Last 3 Encounters:  09/28/23 93 kg (205 lb)   08/15/23 93 kg (205 lb)  08/14/23 92.8 kg (204 lb 9.6 oz)    PHYSICAL EXAM: General:  NAD. No resp difficulty, walked into clinic HEENT: Normal Neck: Supple. No JVD. Carotids 2+ bilat; no bruits. No lymphadenopathy or thryomegaly appreciated. Cor: PMI nondisplaced. Regular rate & rhythm. No rubs, gallops or murmurs. Lungs: Clear Abdomen: Soft, nontender, nondistended. No hepatosplenomegaly. No bruits or masses. Good bowel sounds. Extremities: No cyanosis, clubbing, rash, edema Neuro: Alert & oriented x 3, cranial nerves grossly intact. Moves all 4 extremities w/o difficulty. Affect pleasant.  ECG (personally reviewed from 09/18/23): NSR with PVC, QRS 133 msec  ReDs: 34%  Device interrogation (personally reviewed): <1 % VP, VF 09/18/23 received ATP  ASSESSMENT AND PLAN:  1. Chronic Systolic Heart Failure:  - NICM - Echo (09/2017): EF 20-25%.  - Echo (04/20/21): EF 20-25% mild MR. RV ok  - S/P St Jude ICD.  - Echo today 09/28/23, EF ~20%, RV ok - Stable NYHA II.  Volume looks good today.  - Continue Jardiance 10 mg daily.  - Continue Lasix 20 mg daily + 20 KCL daily. - Continue Entresto 24/26 mg bid.  - Continue Toprol XL 50 mg daily. - Continue Inspra 12.5 mg daily. - Given persistently reduced EF, will get CPX test to objectively quantify exercise capacity.  - Not sure he will be a candidate for advanced therapies, will need to demonstrate better compliance with follow up and stop smoking. - Labs today.  2. NSVT - Frequent NSVT 8/22 - Ordered stress echo at [facility] 7/22 but never followed up - Continue amiodarone 200 mg daily. - Continue Toprol. - Now followed in our device clinic. - Device interrogation as above, ATP on 09/18/23, coincides with syncopal event. No driving x 6 months. - Will reach out to EP regarding event. - Amio labs ok 7/24  3. HIV  - He follows with ID.     4. Tobacco use disorder   - Smoking 1/2 ppd.  - Discussed cessation.  5. Cocaine abuse -  No further use since 02/2022. - Congratulations.  6. Noncompliance - this has been a major hurdle in his care  Follow up in 4 months with Dr. Gala Romney  Jacklynn Ganong, FNP  9:01 AM

## 2023-09-28 NOTE — Progress Notes (Signed)
ReDS Vest / Clip - 09/28/23 0900       ReDS Vest / Clip   Station Marker C    Ruler Value 30    ReDS Value Range Low volume    ReDS Actual Value 34

## 2023-09-28 NOTE — Patient Instructions (Addendum)
Thank you for coming in today  If you had labs drawn today, any labs that are abnormal the clinic will call you No news is good news  Medications: Change Lasix to 20 mg daily   Follow up appointments:  Your physician recommends that you schedule a follow-up appointment in:  4 months With Dr. Luanna Cole are scheduled for a Cardiopulmonary Exercise (CPX) Test as [facility] on: Date: 10/15/2023     Time: 1:30pm   Expect to be in the lab for 2 hours. Please plan to arrive 30 minutes prior to your appointment. You may be asked to reschedule your test if you arrive 20 minutes or more after your scheduled appointment time.  Main Campus address: [address] [place], [place] [address] You may arrive to the Main Entrance A or Entrance C (free valet parking is available at both). -Main Entrance A (on [address]) :proceed to admitting for check in -Entrance C (on [facility]): proceed to [employer] parking or under hospital deck parking using this code _________  Check In: [facility] waiting room (1st floor)   General Instructions for the day of the test (Please follow all instructions from your physician): Refrain from ingesting a heavy meal, alcohol, or caffeine or using tobacco products within 2 hours of the test (DO NOT FAST for mare than 8 hours). You may have all other non-alcoholic, non -caffeinated beverage,a light snack (crackers,a piece of fruit, carrot sticks, toast bagel,etc) up to your appointment. Avoid significant exertion or exercise within 24 hours of your test. Be prepared to exercise and sweat. Your clothing should permit freedom of movement and include walking or running shoes. Women bring loose fitting short sleeved blouse.  This evaluation may be fatiguing and you may wish ti have someone accompany you to the assessment to drive you home afterward. Bring a list of your medications with you, including dosage and frequency you take the medications (   I.e.,once per day, twice per day, etc). Take all medications as prescribed, unless noted below or instructed to do so by your physician.  Please do not take the following medications prior to your CPX:  _________________________________________________  _________________________________________________  Brief description of the test: A brief lung test will be performed. This will involve you taking deep breaths and blowing hard and fast through your mouth. During these , a clip will be on your nose and you will be breathing through a breathing device.   For the exercise portion of the test you will be walking on a treadmill, or riding a stationary bike, to your maximal effor or until symptoms such as chest pain, shortness of breath, leg pain or dizziness limit your exercise. You will be breathing in and out of a breathing device through your mouth (a clip will be on your nose again). Your heart rate, ECG, blood pressure, oxygen saturations, breathing rate and depth, amount of oxygen you consume and amount of carbon dioxide you produce will be measured and monitored throughout the exercise test.  If you need to cancel or reschedule your appointment please call [phone] If you have further questions please call your physician or Philip Aspen, MS, ACSM-RCEP at [phone]     [facility] Guidelines for over-the-counter treatments for mild illnesses  Medications You can Use  Brand Name Generic Name Uses Comments  Benadryl Diphenhydramine Cold/allergy symptoms Do not use any Bendaryl products that say "congestion"  Loratadine Claritin Cold/allergy symptoms --  Coricidin HBP  Varies with formulation Cold/flu  symptoms --  Mussinex Guaifenesin Mucus removal  --  Delsym Dextromethorphan Cough suppression --  Robitussin Dextromethorphan and guaifenesin Cough suppression + mucus removal Do not use robitussin products that include "PE", "PM," or "CF"  Vicks VapoRub Camphor,  eucalyptus oil, menthol Cough and congestion  --  Vicks 44 Varies with formulation Cough suppression Do not use Vicks 44 D  Saline Nasal Spray Saline Congestion relief --   Medications to AVOID  Brand Name Generic Name  Alka-Seltzer Calcium carbonate + aspirin  Sudafed Pseudoephedrine  Comtrex Acetaminophen + pseudoephedrine  Theraflu Acetaminophen + phenylephrine  Dimetapp Brompheniramine + dextromethorphan + phenylephrine  Medicated nasal sprays Oxymetazoline, phenylephrine, etc.  Drixoral Dexbrompheniramine + pseudoephedrine  Chlor-Trimeton Chlorpheniramine  *AVOID AS THESE MEDICATIONS CAN CAUSE HIGH BLOOD PRESSURE AND HEART RATE  Common ingredients to avoid: pseudoephedrine, epinephrine, phenylephrine, brompheniramine, chlorpheniramine (ALWAYS READ LABLES CAREFULLY)  THIS IS NOT A COMPREHENSIVE LIST - If unsure about a medicine, PLEASE CALL AND ASK!    Do the following things EVERYDAY: Weigh yourself in the morning before breakfast. Write it down and keep it in a log. Take your medicines as prescribed Eat low salt foods--Limit salt (sodium) to 2000 mg per day.  Stay as active as you can everyday Limit all fluids for the day to less than 2 liters   At the [facility], you and your health needs are our priority. As part of our continuing mission to provide you with exceptional heart care, we have created designated Provider Care Teams. These Care Teams include your primary Cardiologist (physician) and Advanced Practice Providers (APPs- Physician Assistants and Nurse Practitioners) who all work together to provide you with the care you need, when you need it.   You may see any of the following providers on your designated Care Team at your next follow up: Dr Arvilla Meres Dr Marca Ancona Dr. Marcos Eke, NP Robbie Lis, [place] [facility] [place], [place] Brynda Peon, NP Karle Plumber, PharmD   Please be sure to bring in all your  medications bottles to every appointment.    Thank you for choosing [facility]  If you have any questions or concerns before your next appointment please send [place] a message through [place] or call our office at [phone].    TO LEAVE A MESSAGE FOR THE NURSE SELECT OPTION 2, PLEASE LEAVE A MESSAGE INCLUDING: YOUR NAME DATE OF BIRTH CALL BACK NUMBER REASON FOR CALL**this is important as we prioritize the call backs  YOU WILL RECEIVE A CALL BACK THE SAME DAY AS LONG AS YOU CALL BEFORE 4:00 PM

## 2023-10-15 ENCOUNTER — Encounter (HOSPITAL_COMMUNITY): Payer: 59

## 2023-10-16 ENCOUNTER — Encounter: Payer: Self-pay | Admitting: Family Medicine

## 2023-10-25 ENCOUNTER — Encounter: Payer: 59 | Admitting: Physician Assistant

## 2023-10-30 ENCOUNTER — Telehealth (HOSPITAL_COMMUNITY): Payer: Self-pay | Admitting: Family Medicine

## 2023-10-30 ENCOUNTER — Telehealth: Payer: Self-pay | Admitting: Physician Assistant

## 2023-10-30 NOTE — Telephone Encounter (Signed)
Transmission received.  No episodes noted.  Normal device function.

## 2023-10-30 NOTE — Telephone Encounter (Signed)
12/10v@ 3pm Heart failure follow up

## 2023-10-30 NOTE — Telephone Encounter (Signed)
Left message requesting call back.  Need to have Pt send transmission.

## 2023-10-30 NOTE — Telephone Encounter (Signed)
.  SYNCOPE[facility]   Pt c/o Syncope: STAT if syncope occurred within 24 hours and pt complains of lightheadedness.   High Priority if episode of passing out, completely, today or in last 24 hours   1. Did you pass out today? No    2. When is the last time you passed out? Last week    3. Has this occurred multiple times? Yes   4. Did you have any symptoms prior to passing out? Lightheadedness   5. Did you fall? If so, are you on a blood thinner?  No   Per pt said, he passed out on 09/18/23 and last week. He get dizzy on and off everyday. He saw his pcp yesterday at [facility] and was told to make an appt to see if he need to change his medication. He made an appt with Renee pn 11/26

## 2023-10-31 NOTE — Telephone Encounter (Signed)
Scheduled

## 2023-11-01 NOTE — Progress Notes (Signed)
Electrophysiology Office Note:   Date:  11/01/2023  ID:  Ryan Long, DOB February 04, 1961, MRN [id number]  Primary Cardiologist: Dr. Rudolpho Sevin (no longer wants to see) AHF: Dr. Gala Romney Electrophysiologist: Will [name], MD    History of Present Illness:   Ryan Long is a 62 y.o. male with h/o NICM, chronic sCHF s/p single chamber Abbott ICD, HLD, HIV, substance abuse (ETOH, THC, Cocaine), CKD II & polycythemia   Unclear mention of possible AFib sporadically in notes, no clear formal diagnosis as far as I can tell  Saw Dr. Gala Romney 03/12/23, meds adjusted for GDMT advancement. noncompliance remained a significant problem with his care  Saw EP team at [facility]/Dr. Jerrell Mylar APP E. Waynette Buttery, NP 04/16/23, discussed having had an ICD shock, felt to be for VT and started on amiodarone Not getting remotes there Discussed missed AHF appointments   He reports that he no longer wants to follow at [facility] and only wants to see his [facility] cardiac doctors going forward   I saw him 08/14/23 He reports good medication compliance Is familiar with the amiodarone, and has been taking 200mg  daily since his 1 week load on BID back in April/May time Unclear specifics about the time in April that he had been shocked, was in bed, does not recall any particular symptoms. He denies any CP, palpitations or cardiac awareness No SOB, mild DOE No near syncope or syncope Gets a little dizzy when coming back up from bending forward He has an echo and HF visit coming up No VT, stable volume status No changes were made Planned for a 3 mo visit  ER visit 09/18/23 with syncope Reported the 2 days prior using extra lasix with concerns he was getting volume OL Briefly lightheaded prior to fainting, syncope was brief, felt to be seconds. Left AMA  Saw HF team 09/28/23, device checked noting treated VT episode (ATP) that correlated to his syncope.  On Zpack for bronchitis, still smoking, reported  drinking electrolyte drinks, denied cocaine  Echo 09/28/23, reviewed by Dr. Elwyn Lade, EF appears ~ 20%, RV ok  Planned for CPX test to objectively quantify exercise capacity.  Unclear if he would be advanced tx candidate, with some noncompliance and ongoing smoking Amiodarone increased to 200mg  BID for 2 weeks in d/w myself and advised a sooner EP f/u  10/30/23 Pt called with recurrent dizzy spells Device transmission with no new arrhythmias/device findings normal without explanation of his dissiness Advised HF team f/u for his meds and earlier EP work in visit given his arrhythmia hx  TODAY He reports that he has been very dizzy for probably a couple months now  He reports 2 episodes of syncope Oct 1st> he was walking up to the house, started to feel weak, lightheaded and as he grabbed the railing he fainted, woke he thinks quickly feeling weak afterwards He did not think he got shocked. He continued to have weak spells, lightheaded Went to the ER but left AMA 2/2 wait time  He did NOT increase the amiodarone, admits he was instructed to but didn't  Oct 17th  > was standing up in his bedroom, again started to feel weak, lightheaded and fainted, again thinks was brief, again felt quite weak for some time afterwards\  He saw his PMD team 10/22, there he was advised to: cut his amiodarone to 100mg  daily Cut his lasix to 10mg  daily Cut his toprol to 25mg  daily Stopped eplerenone Increased his melatonin No change to his Entresto or flomax  He reports recurrent episodes of dizziness, these feels sudden and brief, definitely worse and more frequent when he is standing, but has had some seated as well, none when supine Mostly they seem fast but repetitive When he sits he will feel better, laying down always feels better  No CP, denies SOB or DOE  Walking in he reports feeling lightheaded Once in the room and seated  BP 108/72 After I enter the room he reports that he has become dizzy  and lightheaded again Assisted to the exam table and laid down he reported making him feel much better Device was interrogated Oct 1 PMVTVF did seem to organize, though remained very fast, ATP during charge failed > VF >> HV therapy with a dirty break  NO FURTHER EPISODES noted including  "interrogate old episodes" He has not had any NSVT either  He was observed to have some PVCs though not persistently  Feeling better orthostatic BP done NOTE he had taken no meds yet today Supine 118/70 Seated 110/70 Standing 122/80 1 min 120/80 110/72 No symptoms  He though afterwards did say he felt briefly lightheaded, strong radial pulse with rare PVC No reports palpitations, symptoms with PVC during interrogation   Device History: Abbott Single Chamber ICD implanted 01/08/2018   + appropriate tx  AAD hx amiodarone, started 03/2023 at [facility]      EP Information / Studies Reviewed:    EKG: done today and reviewed by myself SR, 1st degree AVblock , no PVCs (noted on prior), ST/T changes are unchanged from prior  ICD Interrogation-  done today and reviewed by myself Battery and lead measurements are good Oct 1st appropriate therapy (as described above) PMVT/VF >MMVT >ATP > VF > HV tx > terminated No further arrhythmias or therapies  09/27/2021: TTE  1. Left ventricular ejection fraction, by estimation, is 20 to 25%. The  left ventricle has severely decreased function. The left ventricle  demonstrates global hypokinesis. The left ventricular internal cavity size  was mildly to moderately dilated. Left  ventricular diastolic parameters are consistent with Grade I diastolic  dysfunction (impaired relaxation).   2. Right ventricular systolic function is normal. The right ventricular  size is normal. Tricuspid regurgitation signal is inadequate for assessing  PA pressure.   3. The mitral valve is normal in structure. Mild mitral valve  regurgitation.   4. The aortic valve is  normal in structure. Aortic valve regurgitation is  not visualized.   5. The inferior vena cava is normal in size with greater than 50%  respiratory variability, suggesting right atrial pressure of 3 mmHg.   Comparison(s): No significant change from prior study.    12/13/2021 TTE SUMMARY  CPS 02-26-14 EF has declined.  Echo contrast study was not performed due to the patient refusal.  Ectopy during study.  There is mild concentric left ventricular hypertrophy.  The left ventricular size is normal.  LV ejection fraction = 25-30%.  Left ventricular systolic function is severely reduced.  Left ventricular filling pattern is indeterminate.  There is severe global hypokinesis of the left ventricle.  Device lead in the right ventricle  The right ventricle is mildly dilated.  The left atrium is mildly dilated.  The right atrium is mildly dilated.  There is mild mitral regurgitation.  There was insufficient TR detected to calculate RV systolic pressure.  There is a pacemaker lead in the right ventricle.   Last Stress Test  01/04/2022 IMPRESSION:  1. Images are consistent with dilated cardiomyopathy  with a fixed  inferior wall defect and no significant reversibility. Of interest is the  fact that there is significant reverse redistribution of the septal wall  and anterior wall which is of unknown clinical significance   2. Calculated ejection fraction 17%   3. Dilated cardiomyopathy with an ejection fraction of 17% calculated and  a fixed defect of the inferior wall without significant reversibility.   Risk Assessment/Calculations:         Physical Exam:   VS:  There were no vitals taken for this visit.   Wt Readings from Last 3 Encounters:  09/28/23 205 lb (93 kg)  08/15/23 205 lb (93 kg)  08/14/23 204 lb 9.6 oz (92.8 kg)     GEN: Well nourished, well developed in no acute distress NECK: No JVD; No carotid bruits CARDIAC: RRR, no murmurs, rubs, gallops RESPIRATORY: CTA  b/l without rales, wheezing or rhonchi  ABDOMEN: Soft, non-tender, non-distended EXTREMITIES: trace if any edema; No deformity   ASSESSMENT AND PLAN:    Chronic Systolic Dysfunction CHF No symptoms or exam findings of volume OL CorVue wobbles was up (dry) though perhaps trending down some, remains above threshold  Dizziness, syncope Case discussed with DOD today, reviewed device findings, EKG One syncopal event accounted for by his arrhythmia Though not the remainder of his symptoms  The behavior of some of his sudden and fleeting dizzy spells sound of NSVT perhaps though none noted by his device No HRs even close to his base pacing rate of 40 (no bradycardia)  Advised that he increase his amiodarone to 200mg  BID then back to 200mg  daily  By description of much of his other symptoms and no other clear etiology otherwise Suspect hypotension  Stay off Elperenone Stop lasix Keep toprol on board at 25mg  dialy Keep Entresto  Labs today Sees HF team next week Back with EP again in 2 months  ICD normal ICD function no changes today   VT  + treated episode as discussed above Increase amiodarone 200mg  BID then back to 200mg  daily He does not drive  DISCUSSED ER PRECAUTIONS/INSTRUCTIONS   60 minute visit time   Disposition:   as above  Francis Dowse, PA-C [facility] - Electrophysiology  11/01/2023, 7:32 AM

## 2023-11-02 ENCOUNTER — Ambulatory Visit: Payer: 59 | Attending: Physician Assistant | Admitting: Physician Assistant

## 2023-11-02 ENCOUNTER — Encounter: Payer: Self-pay | Admitting: Physician Assistant

## 2023-11-02 VITALS — BP 108/72 | HR 76 | Ht 69.0 in | Wt 203.4 lb

## 2023-11-02 DIAGNOSIS — Z79899 Other long term (current) drug therapy: Secondary | ICD-10-CM | POA: Diagnosis not present

## 2023-11-02 DIAGNOSIS — I472 Ventricular tachycardia, unspecified: Secondary | ICD-10-CM

## 2023-11-02 DIAGNOSIS — R55 Syncope and collapse: Secondary | ICD-10-CM | POA: Diagnosis not present

## 2023-11-02 DIAGNOSIS — R42 Dizziness and giddiness: Secondary | ICD-10-CM

## 2023-11-02 DIAGNOSIS — Z95810 Presence of automatic (implantable) cardiac defibrillator: Secondary | ICD-10-CM

## 2023-11-02 DIAGNOSIS — I428 Other cardiomyopathies: Secondary | ICD-10-CM

## 2023-11-02 DIAGNOSIS — I5022 Chronic systolic (congestive) heart failure: Secondary | ICD-10-CM

## 2023-11-02 LAB — CUP PACEART INCLINIC DEVICE CHECK
Battery Remaining Longevity: 33 mo
Brady Statistic RV Percent Paced: 0 %
Date Time Interrogation Session: 20241115193748
HighPow Impedance: 75.375
Implantable Lead Connection Status: 753985
Implantable Lead Implant Date: 20190122
Implantable Lead Location: 753860
Implantable Pulse Generator Implant Date: 20190122
Lead Channel Impedance Value: 387.5 Ohm
Lead Channel Pacing Threshold Amplitude: 0.75 V
Lead Channel Pacing Threshold Amplitude: 0.75 V
Lead Channel Pacing Threshold Pulse Width: 0.5 ms
Lead Channel Pacing Threshold Pulse Width: 0.5 ms
Lead Channel Sensing Intrinsic Amplitude: 5.3 mV
Lead Channel Setting Pacing Amplitude: 2 V
Lead Channel Setting Pacing Pulse Width: 0.5 ms
Lead Channel Setting Sensing Sensitivity: 0.5 mV
Pulse Gen Serial Number: 7376107
Zone Setting Status: 755011

## 2023-11-02 MED ORDER — AMIODARONE HCL 200 MG PO TABS: ORAL_TABLET | ORAL | 1 refills | Status: DC

## 2023-11-02 NOTE — Patient Instructions (Signed)
Medication Instructions:  Your physician has recommended you make the following change in your medication: \ STOP Eplerenone (Inspra) STOP Lasix RESUME Amiodarone to      200 mg twice daily for 2 weeks, then decrease to 200 mg once a day  4. Continue Metoprolol 25 mg daily  *If you need a refill on your cardiac medications before your next appointment, please call your pharmacy*   Lab Work: Today: BMET, Magnesium & CBC  If you have any lab test that is abnormal or we need to change your treatment, we will call you to review the results.   Testing/Procedures: None ordered   Follow-Up: At [facility], you and your health needs are our priority.  As part of our continuing mission to provide you with exceptional heart care, we have created designated Provider Care Teams.  These Care Teams include your primary Cardiologist (physician) and Advanced Practice Providers (APPs -  Physician Assistants and Nurse Practitioners) who all work together to provide you with the care you need, when you need it.  Your next appointment:   1 - 2 week(s)  The format for your next appointment:   In Person  Provider:   Heart Failure Clinic {   Thank you for choosing [facility]!!   [phone]  Other Instructions  If you have further episodes of passing out please go to the ER for evaluation

## 2023-11-03 LAB — CBC
Hematocrit: 54.5 % — ABNORMAL HIGH (ref 37.5–51.0)
Hemoglobin: 18.5 g/dL — ABNORMAL HIGH (ref 13.0–17.7)
MCH: 34.8 pg — ABNORMAL HIGH (ref 26.6–33.0)
MCHC: 33.9 g/dL (ref 31.5–35.7)
MCV: 102 fL — ABNORMAL HIGH (ref 79–97)
Platelets: 130 10*3/uL — ABNORMAL LOW (ref 150–450)
RBC: 5.32 x10E6/uL (ref 4.14–5.80)
RDW: 13.2 % (ref 11.6–15.4)
WBC: 2.2 10*3/uL — CL (ref 3.4–10.8)

## 2023-11-03 LAB — BASIC METABOLIC PANEL
BUN/Creatinine Ratio: 10 (ref 10–24)
BUN: 14 mg/dL (ref 8–27)
CO2: 23 mmol/L (ref 20–29)
Calcium: 8.7 mg/dL (ref 8.6–10.2)
Chloride: 108 mmol/L — ABNORMAL HIGH (ref 96–106)
Creatinine, Ser: 1.39 mg/dL — ABNORMAL HIGH (ref 0.76–1.27)
Glucose: 100 mg/dL — ABNORMAL HIGH (ref 70–99)
Potassium: 4.1 mmol/L (ref 3.5–5.2)
Sodium: 142 mmol/L (ref 134–144)
eGFR: 57 mL/min/{1.73_m2} — ABNORMAL LOW (ref >59)

## 2023-11-03 LAB — MAGNESIUM: Magnesium: 2.2 mg/dL (ref 1.6–2.3)

## 2023-11-05 ENCOUNTER — Telehealth: Payer: Self-pay | Admitting: *Deleted

## 2023-11-05 NOTE — Telephone Encounter (Addendum)
Pt advised to stop Potassium supplementation per Francis Dowse, PA.  States he has stopped all his medications. Advised to keep appt w/ heart failure clinic on Wednesday. Pt agreeable to plan.  Forwarding to [facility] for FYI that pt has stopped all medications at this time.

## 2023-11-06 ENCOUNTER — Telehealth: Payer: Self-pay | Admitting: Cardiology

## 2023-11-06 NOTE — Telephone Encounter (Signed)
After talking with Sherri RN and creating a spot to get the patient in, I called the patient to get him on Dr. Elberta Fortis schedule for 12/3. Patient immediately starts cursing - stating "now y'all give a damn" & "that y'all ain't never cared before now so why am I starting to get calls from everyone". I told the patient I was sorry for how things have gone thus far, but that I could get him in with Dr. Elberta Fortis on 12/3. He wanted to know what Dr. Elberta Fortis would even do in regards to the medications at that appt. I let the patient know that it would be an appt to follow up on his ICD and to make sure they were on the same page, I then advised him the medications would be handled by the Heart Failure Clinic as I was advised by RN. He states - now y'all want to be on the same page" & stated "no I'm not going to do that, I'm done with them, they keep messing around". When I asked him again about 12/3, he stated - "no that's ok". Call disconnected.

## 2023-11-07 ENCOUNTER — Encounter (HOSPITAL_COMMUNITY): Payer: 59

## 2023-11-07 ENCOUNTER — Encounter (HOSPITAL_COMMUNITY): Payer: Self-pay

## 2023-11-07 ENCOUNTER — Ambulatory Visit: Payer: 59 | Admitting: Cardiology

## 2023-11-13 ENCOUNTER — Encounter: Payer: 59 | Admitting: Physician Assistant

## 2023-11-19 ENCOUNTER — Encounter (HOSPITAL_COMMUNITY): Payer: 59

## 2023-11-20 ENCOUNTER — Encounter: Payer: 59 | Admitting: Cardiology

## 2023-11-20 ENCOUNTER — Encounter (HOSPITAL_COMMUNITY): Payer: 59

## 2023-11-23 DIAGNOSIS — I110 Hypertensive heart disease with heart failure: Secondary | ICD-10-CM | POA: Diagnosis not present

## 2023-11-23 DIAGNOSIS — N1831 Chronic kidney disease, stage 3a: Secondary | ICD-10-CM | POA: Diagnosis not present

## 2023-11-23 DIAGNOSIS — Z95810 Presence of automatic (implantable) cardiac defibrillator: Secondary | ICD-10-CM | POA: Diagnosis not present

## 2023-11-23 DIAGNOSIS — R011 Cardiac murmur, unspecified: Secondary | ICD-10-CM | POA: Diagnosis not present

## 2023-11-23 DIAGNOSIS — R600 Localized edema: Secondary | ICD-10-CM | POA: Diagnosis not present

## 2023-11-23 DIAGNOSIS — J441 Chronic obstructive pulmonary disease with (acute) exacerbation: Secondary | ICD-10-CM | POA: Diagnosis not present

## 2023-11-23 DIAGNOSIS — E785 Hyperlipidemia, unspecified: Secondary | ICD-10-CM | POA: Diagnosis not present

## 2023-11-23 DIAGNOSIS — I5032 Chronic diastolic (congestive) heart failure: Secondary | ICD-10-CM | POA: Diagnosis not present

## 2023-11-23 DIAGNOSIS — I130 Hypertensive heart and chronic kidney disease with heart failure and stage 1 through stage 4 chronic kidney disease, or unspecified chronic kidney disease: Secondary | ICD-10-CM | POA: Diagnosis not present

## 2023-11-27 ENCOUNTER — Encounter (HOSPITAL_COMMUNITY): Payer: 59

## 2023-12-04 ENCOUNTER — Ambulatory Visit: Payer: 59 | Admitting: Internal Medicine

## 2023-12-04 ENCOUNTER — Telehealth: Payer: Self-pay | Admitting: Cardiology

## 2023-12-04 DIAGNOSIS — I110 Hypertensive heart disease with heart failure: Secondary | ICD-10-CM | POA: Diagnosis not present

## 2023-12-04 DIAGNOSIS — R918 Other nonspecific abnormal finding of lung field: Secondary | ICD-10-CM | POA: Diagnosis not present

## 2023-12-04 DIAGNOSIS — R062 Wheezing: Secondary | ICD-10-CM | POA: Diagnosis not present

## 2023-12-04 DIAGNOSIS — I491 Atrial premature depolarization: Secondary | ICD-10-CM | POA: Diagnosis not present

## 2023-12-04 DIAGNOSIS — Z79899 Other long term (current) drug therapy: Secondary | ICD-10-CM | POA: Diagnosis not present

## 2023-12-04 DIAGNOSIS — Z20822 Contact with and (suspected) exposure to covid-19: Secondary | ICD-10-CM | POA: Diagnosis not present

## 2023-12-04 DIAGNOSIS — F17210 Nicotine dependence, cigarettes, uncomplicated: Secondary | ICD-10-CM | POA: Diagnosis not present

## 2023-12-04 DIAGNOSIS — J441 Chronic obstructive pulmonary disease with (acute) exacerbation: Secondary | ICD-10-CM | POA: Diagnosis not present

## 2023-12-04 DIAGNOSIS — R202 Paresthesia of skin: Secondary | ICD-10-CM | POA: Diagnosis not present

## 2023-12-04 DIAGNOSIS — K449 Diaphragmatic hernia without obstruction or gangrene: Secondary | ICD-10-CM | POA: Diagnosis not present

## 2023-12-04 DIAGNOSIS — I509 Heart failure, unspecified: Secondary | ICD-10-CM | POA: Diagnosis not present

## 2023-12-04 DIAGNOSIS — I499 Cardiac arrhythmia, unspecified: Secondary | ICD-10-CM | POA: Diagnosis not present

## 2023-12-04 DIAGNOSIS — I4891 Unspecified atrial fibrillation: Secondary | ICD-10-CM | POA: Diagnosis not present

## 2023-12-04 DIAGNOSIS — R0602 Shortness of breath: Secondary | ICD-10-CM | POA: Diagnosis not present

## 2023-12-04 NOTE — Telephone Encounter (Signed)
Was unable to call [facility] as we were not left w/ name/number. Called pt who was surprised that we were calling as he has established w/ cardiology at [facility].  He stated he does not know why they called [place], and I gave pt information as to their call in.  He will call Dr Marcelle Overlie office to discuss this further. He appreciates my follow up on this.

## 2023-12-04 NOTE — Telephone Encounter (Signed)
  Cristino Martes, home nurse, is calling because patient was taken off of Lasix at last appt but he is reporting swelling and shortness of breath. She also states that he is not taking his other meds as directed. She would like someone to call and speak with the patient.

## 2023-12-09 NOTE — Progress Notes (Deleted)
Electrophysiology Office Note:   Date:  12/09/2023  ID:  Ryan Long, DOB Feb 23, 1961, MRN [id number]  Primary Cardiologist: Dr. Rudolpho Sevin (no longer wants to see) AHF: Dr. Gala Romney Electrophysiologist: Ryan [name], MD    History of Present Illness:   Ryan Long is a 62 y.o. male with h/o NICM, chronic sCHF s/p single chamber Abbott ICD, HLD, HIV, substance abuse (ETOH, THC, Cocaine), CKD II & polycythemia   Unclear mention of possible AFib sporadically in notes, no clear formal diagnosis as far as I can tell  Saw Dr. Gala Romney 03/12/23, meds adjusted for GDMT advancement. noncompliance remained a significant problem with his care  Saw EP team at [facility]/Dr. Jerrell Mylar APP E. Waynette Buttery, NP 04/16/23, discussed having had an ICD shock, felt to be for VT and started on amiodarone Not getting remotes there Discussed missed AHF appointments   He reports that he no longer wants to follow at [facility] and only wants to see his [facility] cardiac doctors going forward   I saw him 08/14/23 He reports good medication compliance Is familiar with the amiodarone, and has been taking 200mg  daily since his 1 week load on BID back in April/May time Unclear specifics about the time in April that he had been shocked, was in bed, does not recall any particular symptoms. He denies any CP, palpitations or cardiac awareness No SOB, mild DOE No near syncope or syncope Gets a little dizzy when coming back up from bending forward He has an echo and HF visit coming up No VT, stable volume status No changes were made Planned for a 3 mo visit  ER visit 09/18/23 with syncope Reported the 2 days prior using extra lasix with concerns he was getting volume OL Briefly lightheaded prior to fainting, syncope was brief, felt to be seconds. Left AMA  Saw HF team 09/28/23, device checked noting treated VT episode (ATP) that correlated to his syncope.  On Zpack for bronchitis, still smoking, reported  drinking electrolyte drinks, denied cocaine  Echo 09/28/23, reviewed by Dr. Elwyn Lade, EF appears ~ 20%, RV ok  Planned for CPX test to objectively quantify exercise capacity.  Unclear if he would be advanced tx candidate, with some noncompliance and ongoing smoking Amiodarone increased to 200mg  BID for 2 weeks in d/w myself and advised a sooner EP f/u  10/30/23 Pt called with recurrent dizzy spells Device transmission with no new arrhythmias/device findings normal without explanation of his dissiness Advised HF team f/u for his meds and earlier EP work in visit given his arrhythmia hx  TODAY He reports that he has been very dizzy for probably a couple months now He reports 2 episodes of syncope Oct 1st> he was walking up to the house, started to feel weak, lightheaded and as he grabbed the railing he fainted, woke he thinks quickly feeling weak afterwards He did not think he got shocked. He continued to have weak spells, lightheaded Went to the ER but left AMA 2/2 wait time He did NOT increase the amiodarone, admits he was instructed to but didn't Oct 17th  > was standing up in his bedroom, again started to feel weak, lightheaded and fainted, again thinks was brief, again felt quite weak for some time afterwards He saw his PMD team 10/22, there he was advised to: cut his amiodarone to 100mg  daily Cut his lasix to 10mg  daily Cut his toprol to 25mg  daily Stopped eplerenone Increased his melatonin No change to his Entresto or flomax He reports recurrent episodes of  dizziness, these feels sudden and brief, definitely worse and more frequent when he is standing, but has had some seated as well, none when supine Mostly they seem fast but repetitive When he sits he Ryan feel better, laying down always feels better No CP, denies SOB or DOE Walking in he reports feeling lightheaded Once in the room and seated  BP 108/72 After I enter the room he reports that he has become dizzy and lightheaded  again Assisted to the exam table and laid down he reported making him feel much better Device was interrogated Oct 1 PMVTVF did seem to organize, though remained very fast, ATP during charge failed > VF >> HV therapy with a dirty break NO FURTHER EPISODES noted including  "interrogate old episodes" He has not had any NSVT either He was observed to have some PVCs though not persistently Feeling better orthostatic BP done NOTE he had taken no meds yet today Supine 118/70 Seated 110/70 Standing 122/80 1 min 120/80 110/72 No symptoms He though afterwards did say he felt briefly lightheaded, strong radial pulse with rare PVC No reports palpitations, symptoms with PVC during interrogation  The case was discussed with DOD The behavior of some of his sudden and fleeting dizzy spells sound of NSVT perhaps though none noted by his device No HRs even close to his base pacing rate of 40 (no bradycardia)   Advised that he increase his amiodarone to 200mg  BID then back to 200mg  daily   By description of much of his other symptoms and no other clear etiology otherwise Suspect hypotension Stay off Elperenone Stop lasix Keep toprol on board at 25mg  dialy Keep Entresto   Labs today Sees HF team next week  Despite our visit of an hour's duration and recommendations to adjust mediations, as above, he subsequently reported his significant dissatisfaction in his care and by his opinion lack of care for his symptoms stopped his medicines and cancelled all of his future appointments with [place] and the HF team as well.  Seems he has re-established care with [facility] cardiology team Saw them 11/14/23 Reported ongoing lightheaded but better,felt to be euvolemic, planned to refer to their AHF clinic Amiodarone stopped 2/2 intolerance reported  ER visit [facility], 12/04/23, progressive SOB despite recent antibiotic/steroid regime, reported weight gain, fluid retention/swelling, planned for  admission/diuresis. 12/04/23 Pt is being asked to leave due to inappropriate behavior. I have attempted to intervene to encourage cooperation and polite intercourse but pt continues to be threatening and verbally aggressive. I followed the de-escalation protocol, but pt refused to change his behavior. Therefore, the patient Ryan be discharged with medications changed per discharge papers. Pt does not appear to be in physical distress at this time.  1. Acute on chronic congestive heart failure, unspecified heart failure type (CMD)  2. COPD exacerbation (CMD)  3. SOB (shortness of breath)   *** symptoms *** volume *** meds *** compliance    Device History: Abbott Single Chamber ICD implanted 01/08/2018   + appropriate tx Oct 2024 + PMVT/VF >MMVT >ATP > VF > HV tx > terminated   AAD hx amiodarone, started 03/2023 at [facility]  >>> stopped Nov 2024 as above by [facility] cardiology team for "intolerance"    EP Information / Studies Reviewed:    EKG: not done today  ICD Interrogation-  done today and reviewed by myself *** Battery and lead measurements are good ***  09/27/2021: TTE  1. Left ventricular ejection fraction, by estimation, is 20 to 25%.  The  left ventricle has severely decreased function. The left ventricle  demonstrates global hypokinesis. The left ventricular internal cavity size  was mildly to moderately dilated. Left  ventricular diastolic parameters are consistent with Grade I diastolic  dysfunction (impaired relaxation).   2. Right ventricular systolic function is normal. The right ventricular  size is normal. Tricuspid regurgitation signal is inadequate for assessing  PA pressure.   3. The mitral valve is normal in structure. Mild mitral valve  regurgitation.   4. The aortic valve is normal in structure. Aortic valve regurgitation is  not visualized.   5. The inferior vena cava is normal in size with greater than 50%  respiratory variability, suggesting right atrial  pressure of 3 mmHg.   Comparison(s): No significant change from prior study.    12/13/2021 TTE SUMMARY  CPS 02-26-14 EF has declined.  Echo contrast study was not performed due to the patient refusal.  Ectopy during study.  There is mild concentric left ventricular hypertrophy.  The left ventricular size is normal.  LV ejection fraction = 25-30%.  Left ventricular systolic function is severely reduced.  Left ventricular filling pattern is indeterminate.  There is severe global hypokinesis of the left ventricle.  Device lead in the right ventricle  The right ventricle is mildly dilated.  The left atrium is mildly dilated.  The right atrium is mildly dilated.  There is mild mitral regurgitation.  There was insufficient TR detected to calculate RV systolic pressure.  There is a pacemaker lead in the right ventricle.   Last Stress Test  01/04/2022 IMPRESSION:  1. Images are consistent with dilated cardiomyopathy with a fixed  inferior wall defect and no significant reversibility. Of interest is the  fact that there is significant reverse redistribution of the septal wall  and anterior wall which is of unknown clinical significance   2. Calculated ejection fraction 17%   3. Dilated cardiomyopathy with an ejection fraction of 17% calculated and  a fixed defect of the inferior wall without significant reversibility.   Risk Assessment/Calculations:         Physical Exam:   VS:  There were no vitals taken for this visit.   Wt Readings from Last 3 Encounters:  11/02/23 203 lb 6.4 oz (92.3 kg)  09/28/23 205 lb (93 kg)  08/15/23 205 lb (93 kg)     GEN: Well nourished, well developed in no acute distress NECK: No JVD; No carotid bruits CARDIAC: *** RRR, no murmurs, rubs, gallops RESPIRATORY: *** CTA b/l without rales, wheezing or rhonchi  ABDOMEN: Soft, non-tender, non-distended EXTREMITIES: *** trace if any edema; No deformity   ASSESSMENT AND PLAN:    Chronic Systolic  Dysfunction CHF *** No symptoms or exam findings of volume OL *** CorVue    ICD *** normal ICD function *** no changes today   VT  ***   Disposition:   as above  Francis Dowse, PA-C [facility] - Electrophysiology  12/09/2023, 10:32 AM

## 2023-12-10 ENCOUNTER — Telehealth: Payer: Self-pay | Admitting: Cardiology

## 2023-12-10 NOTE — Telephone Encounter (Signed)
Per secure chat message from [place], [place] over the weekend on Sunday at 10:49am - she requested EP Scheduler call the patient to advise that he does not need both teams ([facility] & [place]) for EP Care for his ICD. He has seen [facility], as recently as 11/27. I called the patient on Monday 12/23 at 8:27am - I let the patient know I had received a message from [facility] who he was supposed to see on 12/26, and that she had seen where he was seeing Dr. Rudolpho Sevin at [facility] and [place], and that he doesn't need both - patient immediately got loud and he says he is not seeing [facility]. I advised the patient that per our records, he had seen them in November, he responds and states "well this is December" again while talking loudly. I told him he didn't have to yell and to please stop, and that we were just trying to make sure he wasn't seeing both teams as it is not needed. Patient stated "what sense does it make that I'm calling you for an appt if seeing them?" After still talking loudly after asking patient to stop, I advised the patient I would let Luster Landsberg know he wasn't seeing [facility] and would see her in January. Call was disconnected.   For the appointment with Renee on 12/26 - patient called into office 12/23 at 8:10am and stated he wanted to be seen in January. Patient was rescheduled by a scheduler, to be seen on January 10th. After updating Renee on my phone call to patient, she requested to get patient scheduled with Dr. Elberta Fortis. He is now scheduled to see Dr. Elberta Fortis on 1/28.

## 2023-12-13 ENCOUNTER — Ambulatory Visit: Payer: 59 | Admitting: Physician Assistant

## 2023-12-17 ENCOUNTER — Telehealth (HOSPITAL_COMMUNITY): Payer: Self-pay | Admitting: Cardiology

## 2023-12-17 NOTE — Telephone Encounter (Signed)
Pt reports entresto ,amio, KCL, inspara stopped per [facility] 12/17   Currently taking metoprolol 25 daily and jardiance 10  Lasix ordered at 20 mg daily however he is currently taking 30 mg since 12/28

## 2023-12-17 NOTE — Telephone Encounter (Signed)
Patient called to request sooner appt than currently scheduled for 1/9  Reports he is swollen, very SOB, and having a hard time No vitals or weight available Reports it is very difficult to get up the stairs   Does not wish to see APP would like add on with Dr Gala Romney -advised provider is out of town and add on would be with APP  Reports he may wait until follow up  Will forward to triage provider for input

## 2023-12-18 ENCOUNTER — Other Ambulatory Visit: Payer: 59

## 2023-12-18 MED ORDER — POTASSIUM CHLORIDE CRYS ER 20 MEQ PO TBCR: 40.0000 meq | EXTENDED_RELEASE_TABLET | Freq: Every day | ORAL | 3 refills | Status: DC

## 2023-12-18 MED ORDER — FUROSEMIDE 20 MG PO TABS: 40.0000 mg | ORAL_TABLET | Freq: Two times a day (BID) | ORAL | 3 refills | Status: DC

## 2023-12-18 NOTE — Addendum Note (Signed)
 Addended by: Najir Roop, Milagros Reap on: 12/18/2023 04:23 PM   Modules accepted: Orders

## 2023-12-18 NOTE — Telephone Encounter (Signed)
Pt aware.

## 2023-12-20 ENCOUNTER — Other Ambulatory Visit: Payer: 59

## 2023-12-22 ENCOUNTER — Other Ambulatory Visit: Payer: Self-pay | Admitting: Critical Care Medicine

## 2023-12-22 DIAGNOSIS — I5022 Chronic systolic (congestive) heart failure: Secondary | ICD-10-CM

## 2023-12-23 NOTE — Progress Notes (Addendum)
 Advanced Heart Failure Clinic Note   Patient ID: Ryan Long, male   DOB: [birth year], 63 y.o.   MRN: [id number] ID: Dr Daiva Eves PCP: Dr Danise Mina EP : Dr Elberta Fortis  HF: Dr Launa Grill [place] Ryan Long is a 63 y.o. male with HIV, HTN, major depression, chronic systolic heart failure (nonischemic cardiomyopathy), and polysubstance abuse.    Coronary CT 3/12 -> no CAD.  Cardiolite in 2015 showed no ischemia or infarction.  NICM was not felt to be related to HIV given his good CD4 counts. He is S/P St Jude ICD 12/2017.  Echo 09/2017 EF 25-30%   He was evaluated [facility] 1/19 with suicidal ideation. He was placed on IVC. He was later cleared and discharged.   Echo 04/20/21 EF 20-25% mild MR. RV ok   In 5/22 referred for CPX test to objectively quantify exercise capacity. Unfortunately, he did not complete study and was lost to f/u  Saw EP 9/22 and device interrogation showed NSVT. Seen in ED 10/22 after defib fired x 2. Interrogation showed WCT ?VT. Cards consulted and recommended starting amiodarone, but he left AMA.  Seen in HF clinic in 12/13 for first time since 5/22. Doing ok. Echo ordered but he did not f/u.  Seen in ED 1/24 with dizziness and diaphoresis after using cocaine.   Seen in ED 09/18/23 with syncope, left AMA.  Echo 09/28/23, EF 20-25%, RV ok  Scheduled for CPX in 10/24 but did not show says he just can't do it.   ICD interrogation in 10/24 reported ATP for VT. Told to increase Amio to 200 bid but says he didn't get the message. Seen in EP Clinic in 11/24. At that visit reported that PCP cut amio to 100 daily and cut lasix to 10mg  daily, Toprol to 25 daily and stopped eplernone. ICD interrogated and found to have one episode of VT/VF with chock on 09/18/23. No other episodes despite persistent dizziness. Amio increased to 200 bid then back to 200 daily.   [facility]  on 11/14/23 stopped Entresto, eplerenone and cut Toprol back due to low BP. Also stopped amio      Here for f/u. Says he hasn't felt right since his VT episode in 10/24. Very SOB with any activity. Using a scooter to get around. Says he has no energy.  Denies CP.   No cocaine > 51yr. Smokes 8-10 cogs/day   Cardiac Studies: - Echo (5/22): EF 20-25%, mild MR, RV ok  - Echo (10/18): EF 25-30%  - Echo (9/16): EF 30-35%, diffuse hypokinesis, grade I diastolic dysfunction  - Echo (3/47): EF 35-40%  Past Medical History:  Diagnosis Date   Active smoker    AICD (automatic cardioverter/defibrillator) present    Alcohol abuse    Allergy July 2023   Anxiety    AR (allergic rhinitis)    Bipolar 1 disorder (HCC)    Cervical lymphadenitis 04/20/2021   CHF (congestive heart failure) (HCC)    Chronic bronchitis (HCC)    Chronic systolic heart failure (HCC)    Controlled substance agreement signed 10/22/2018   COPD (chronic obstructive pulmonary disease) (HCC)    Cough 12/31/2018   Crack cocaine use    Depression    Genital herpes    HIV (human immunodeficiency virus infection) (HCC) dx'd 08/1993   HLD (hyperlipidemia)    Hypertension    NICM (nonischemic cardiomyopathy) (HCC)    Echocardiogram 06/28/11: EF 30-35%, mild MR, mild LAE;  No CAD by  coronary CT angiogram 3/12 at [facility]   NSVT (nonsustained ventricular tachycardia) (HCC)    PTSD (post-traumatic stress disorder)    Current Outpatient Medications  Medication Sig Dispense Refill   albuterol (VENTOLIN HFA) 108 (90 Base) MCG/ACT inhaler Inhale 2 puffs into the lungs every 6 (six) hours as needed for wheezing or shortness of breath. 6.7 g 3   aspirin EC 81 MG tablet Take 81 mg by mouth daily. Swallow whole.     bictegravir-emtricitabine-tenofovir AF (BIKTARVY) 50-200-25 MG TABS tablet Take 1 tablet by mouth daily. 30 tablet 11   empagliflozin (JARDIANCE) 10 MG TABS tablet Take 1 tablet (10 mg total) by mouth daily before breakfast. 30 tablet 11   finasteride (PROSCAR) 5 MG tablet TAKE 1 TABLET(5 MG) BY MOUTH  DAILY 30 tablet 0   fluticasone furoate-vilanterol (BREO ELLIPTA) 200-25 MCG/ACT AEPB Inhale 1 puff into the lungs daily.     furosemide (LASIX) 20 MG tablet Take 2 tablets (40 mg total) by mouth 2 (two) times daily. 120 tablet 3   Melatonin 10 MG TABS Take 10 mg by mouth at bedtime.     metoprolol succinate (TOPROL-XL) 25 MG 24 hr tablet Take by mouth daily.     Multiple Vitamins-Minerals (MENS MULTIPLUS PO) Take 1 tablet by mouth daily.     nicotine (NICODERM CQ - DOSED IN MG/24 HOURS) 14 mg/24hr patch Place 1 patch (14 mg total) onto the skin daily. 14 patch 0   nicotine (NICODERM CQ - DOSED IN MG/24 HOURS) 21 mg/24hr patch Place 1 patch (21 mg total) onto the skin daily. 28 patch 3   nicotine (NICODERM CQ - DOSED IN MG/24 HR) 7 mg/24hr patch Place 1 patch (7 mg total) onto the skin daily. 7 patch 0   potassium chloride SA (KLOR-CON M) 20 MEQ tablet Take 2 tablets (40 mEq total) by mouth daily. 60 tablet 3   sildenafil (VIAGRA) 50 MG tablet Take 1 tablet (50 mg total) by mouth as needed for erectile dysfunction. 10 tablet 3   tamsulosin (FLOMAX) 0.4 MG CAPS capsule TAKE 2 CAPSULES(0.8 MG) BY MOUTH DAILY AFTER SUPPER 60 capsule 5   triamcinolone ointment (KENALOG) 0.5 % Apply 1 Application topically 2 (two) times daily. 30 g 3   valACYclovir (VALTREX) 500 MG tablet Take 1 tablet (500 mg total) by mouth daily. 60 tablet 4   amiodarone (PACERONE) 200 MG tablet Take 1 tablet (200 mg total) by mouth daily.     No current facility-administered medications for this encounter.   Allergies: No Known Allergies   Review of systems complete and found to be negative unless listed in HPI.    BP (!) 90/50   Pulse 93   Wt 90.5 kg (199 lb 9.6 oz)   SpO2 92%   BMI 29.48 kg/m   Wt Readings from Last 3 Encounters:  12/24/23 90.5 kg (199 lb 9.6 oz)  11/02/23 92.3 kg (203 lb 6.4 oz)  09/28/23 93 kg (205 lb)    PHYSICAL EXAM: General:  Weak appearing. No resp difficulty HEENT: normal Neck: supple.  no JVD. Carotids 2+ bilat; no bruits. No lymphadenopathy or thryomegaly appreciated. Cor: PMI nondisplaced. Regular rate & rhythm.=s3 Lungs: clear Abdomen: soft, nontender, nondistended. No hepatosplenomegaly. No bruits or masses. Good bowel sounds. Extremities: no cyanosis, clubbing, rash, edema Neuro: alert & orientedx3, cranial nerves grossly intact. moves all 4 extremities w/o difficulty. Affect pleasant  Device interrogation (personally reviewed): No recurrent VT/VF. Volume ok. Personally reviewed  ASSESSMENT AND PLAN:  1. Acute on Chronic Systolic Heart Failure:  - NICM - Echo (09/2017): EF 20-25%.  - Echo (04/20/21): EF 20-25% mild MR. RV ok  - S/P St Jude ICD.  - Echo 09/28/23, EF 20-25%, RV ok - Scheduled for CPX in 10/24 but says he didn't show because he felt to poorly to walk onTM - NYHA IV - Continue Jardiance 10 mg daily.  - Continue Lasix 20 mg daily + 20 KCL daily. - Off Entresto 24/26 mg bid and eplernone 12.5 due to low BP (stopped at [facility]) - Continue Toprol XL 25 mg daily. - Continue Inspra 12.5 mg daily. - BP too low to add back GDMT.  - Appears end-stage. Will need to consider if he is candidate for VAD (not candidate for transplant with tobacco use) - Given severity of his HF, will plan R/L heart cath this week with probable admission to follow to determine if he is candidate for advanced therapies  2. NSVT/VT/VF - Frequent NSVT 8/22 - Had VT/VF with shock 09/18/23. Seen by EP. Amio increased to 200 bid for several weeks/ Back to 200 daily. No driving x 6 months  - Amio recently stopped by [facility]. Resume amiodarone 200 mg daily. - Continue Toprol for now. (May need to stop with low output) - No VT on ICD interrogation today.   3. HIV  - He follows with ID.    - continue current regimen  4. Tobacco use disorder   - Still smoking 1/2 ppd. Discussed need for cessation to permit advanced HF theparies  5. Cocaine abuse - Reports no further use since  02/2022. - Will need UDS  6. Noncompliance - this has been a major hurdle in his care  I spent a total of 45 minutes today: 1) reviewing the patient's medical records including previous charts, labs and recent notes from other providers; 2) examining the patient and counseling them on their medical issues/explaining the plan of care; 3) adjusting meds as needed and 4) ordering lab work or other needed tests.    Arvilla Meres, MD  4:43 PM

## 2023-12-23 NOTE — H&P (View-Only) (Signed)
Advanced Heart Failure Clinic Note   Patient ID: Ryan Long, male   DOB: [birth year], 63 y.o.   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 male with HIV, HTN, major depression, chronic systolic heart failure (nonischemic cardiomyopathy), and polysubstance abuse.    Coronary CT 3/12 -> no CAD.  Cardiolite in 2015 showed no ischemia or infarction.  NICM was not felt to be related to HIV given his good CD4 counts. He is S/P St Jude ICD 12/2017.  Echo 09/2017 EF 25-30%   He was evaluated [facility] 1/19 with suicidal ideation. He was placed on IVC. He was later cleared and discharged.   Echo 04/20/21 EF 20-25% mild MR. RV ok   In 5/22 referred for CPX test to objectively quantify exercise capacity. Unfortunately, he did not complete study and was lost to f/u  Saw EP 9/22 and device interrogation showed NSVT. Seen in ED 10/22 after defib fired x 2. Interrogation showed WCT ?VT. Cards consulted and recommended starting amiodarone, but he left AMA.  Seen in HF clinic in 12/13 for first time since 5/22. Doing ok. Echo ordered but he did not f/u.  Seen in ED 1/24 with dizziness and diaphoresis after using cocaine.   Seen in ED 09/18/23 with syncope, left AMA.  Echo 09/28/23, EF 20-25%, RV ok  Scheduled for CPX in 10/24 but did not show says he just can't do it.   ICD interrogation in 10/24 reported ATP for VT. Told to increase Amio to 200 bid but says he didn't get the message. Seen in EP Clinic in 11/24. At that visit reported that PCP cut amio to 100 daily and cut lasix to 10mg  daily, Toprol to 25 daily and stopped eplernone. ICD interrogated and found to have one episode of VT/VF with chock on 09/18/23. No other episodes despite persistent dizziness. Amio increased to 200 bid then back to 200 daily.   [facility]  on 11/14/23 stopped Entresto, eplerenone and cut Toprol back due to low BP. Also stopped amio      Here for f/u. Says he hasn't felt right since his VT episode in 10/24. Very SOB with any activity. Using a scooter to get around. Says he has no energy.  Denies CP.   No cocaine > 51yr. Smokes 8-10 cogs/day   Cardiac Studies: - Echo (5/22): EF 20-25%, mild MR, RV ok  - Echo (10/18): EF 25-30%  - Echo (9/16): EF 30-35%, diffuse hypokinesis, grade I diastolic dysfunction  - Echo (3/47): EF 35-40%  Past Medical History:  Diagnosis Date   Active smoker    AICD (automatic cardioverter/defibrillator) present    Alcohol abuse    Allergy July 2023   Anxiety    AR (allergic rhinitis)    Bipolar 1 disorder (HCC)    Cervical lymphadenitis 04/20/2021   CHF (congestive heart failure) (HCC)    Chronic bronchitis (HCC)    Chronic systolic heart failure (HCC)    Controlled substance agreement signed 10/22/2018   COPD (chronic obstructive pulmonary disease) (HCC)    Cough 12/31/2018   Crack cocaine use    Depression    Genital herpes    HIV (human immunodeficiency virus infection) (HCC) dx'd 08/1993   HLD (hyperlipidemia)    Hypertension    NICM (nonischemic cardiomyopathy) (HCC)    Echocardiogram 06/28/11: EF 30-35%, mild MR, mild LAE;  No CAD by  coronary CT angiogram 3/12 at [facility]   NSVT (nonsustained ventricular tachycardia) (HCC)    PTSD (post-traumatic stress disorder)    Current Outpatient Medications  Medication Sig Dispense Refill   albuterol (VENTOLIN HFA) 108 (90 Base) MCG/ACT inhaler Inhale 2 puffs into the lungs every 6 (six) hours as needed for wheezing or shortness of breath. 6.7 g 3   aspirin EC 81 MG tablet Take 81 mg by mouth daily. Swallow whole.     bictegravir-emtricitabine-tenofovir AF (BIKTARVY) 50-200-25 MG TABS tablet Take 1 tablet by mouth daily. 30 tablet 11   empagliflozin (JARDIANCE) 10 MG TABS tablet Take 1 tablet (10 mg total) by mouth daily before breakfast. 30 tablet 11   finasteride (PROSCAR) 5 MG tablet TAKE 1 TABLET(5 MG) BY MOUTH  DAILY 30 tablet 0   fluticasone furoate-vilanterol (BREO ELLIPTA) 200-25 MCG/ACT AEPB Inhale 1 puff into the lungs daily.     furosemide (LASIX) 20 MG tablet Take 2 tablets (40 mg total) by mouth 2 (two) times daily. 120 tablet 3   Melatonin 10 MG TABS Take 10 mg by mouth at bedtime.     metoprolol succinate (TOPROL-XL) 25 MG 24 hr tablet Take by mouth daily.     Multiple Vitamins-Minerals (MENS MULTIPLUS PO) Take 1 tablet by mouth daily.     nicotine (NICODERM CQ - DOSED IN MG/24 HOURS) 14 mg/24hr patch Place 1 patch (14 mg total) onto the skin daily. 14 patch 0   nicotine (NICODERM CQ - DOSED IN MG/24 HOURS) 21 mg/24hr patch Place 1 patch (21 mg total) onto the skin daily. 28 patch 3   nicotine (NICODERM CQ - DOSED IN MG/24 HR) 7 mg/24hr patch Place 1 patch (7 mg total) onto the skin daily. 7 patch 0   potassium chloride SA (KLOR-CON M) 20 MEQ tablet Take 2 tablets (40 mEq total) by mouth daily. 60 tablet 3   sildenafil (VIAGRA) 50 MG tablet Take 1 tablet (50 mg total) by mouth as needed for erectile dysfunction. 10 tablet 3   tamsulosin (FLOMAX) 0.4 MG CAPS capsule TAKE 2 CAPSULES(0.8 MG) BY MOUTH DAILY AFTER SUPPER 60 capsule 5   triamcinolone ointment (KENALOG) 0.5 % Apply 1 Application topically 2 (two) times daily. 30 g 3   valACYclovir (VALTREX) 500 MG tablet Take 1 tablet (500 mg total) by mouth daily. 60 tablet 4   amiodarone (PACERONE) 200 MG tablet Take 1 tablet (200 mg total) by mouth daily.     No current facility-administered medications for this encounter.   Allergies: No Known Allergies   Review of systems complete and found to be negative unless listed in HPI.    BP (!) 90/50   Pulse 93   Wt 90.5 kg (199 lb 9.6 oz)   SpO2 92%   BMI 29.48 kg/m   Wt Readings from Last 3 Encounters:  12/24/23 90.5 kg (199 lb 9.6 oz)  11/02/23 92.3 kg (203 lb 6.4 oz)  09/28/23 93 kg (205 lb)    PHYSICAL EXAM: General:  Weak appearing. No resp difficulty HEENT: normal Neck: supple.  no JVD. Carotids 2+ bilat; no bruits. No lymphadenopathy or thryomegaly appreciated. Cor: PMI nondisplaced. Regular rate & rhythm.=s3 Lungs: clear Abdomen: soft, nontender, nondistended. No hepatosplenomegaly. No bruits or masses. Good bowel sounds. Extremities: no cyanosis, clubbing, rash, edema Neuro: alert & orientedx3, cranial nerves grossly intact. moves all 4 extremities w/o difficulty. Affect pleasant  Device interrogation (personally reviewed): No recurrent VT/VF. Volume ok. Personally reviewed  ASSESSMENT AND PLAN:  1. Acute on Chronic Systolic Heart Failure:  - NICM - Echo (09/2017): EF 20-25%.  - Echo (04/20/21): EF 20-25% mild MR. RV ok  - S/P St Jude ICD.  - Echo 09/28/23, EF 20-25%, RV ok - Scheduled for CPX in 10/24 but says he didn't show because he felt to poorly to walk onTM - NYHA IV - Continue Jardiance 10 mg daily.  - Continue Lasix 20 mg daily + 20 KCL daily. - Off Entresto 24/26 mg bid and eplernone 12.5 due to low BP (stopped at [facility]) - Continue Toprol XL 25 mg daily. - Continue Inspra 12.5 mg daily. - BP too low to add back GDMT.  - Appears end-stage. Will need to consider if he is candidate for VAD (not candidate for transplant with tobacco use) - Given severity of his HF, will plan R/L heart cath this week with probable admission to follow to determine if he is candidate for advanced therapies  2. NSVT/VT/VF - Frequent NSVT 8/22 - Had VT/VF with shock 09/18/23. Seen by EP. Amio increased to 200 bid for several weeks/ Back to 200 daily. No driving x 6 months  - Amio recently stopped by [facility]. Resume amiodarone 200 mg daily. - Continue Toprol for now. (May need to stop with low output) - No VT on ICD interrogation today.   3. HIV  - He follows with ID.    - continue current regimen  4. Tobacco use disorder   - Still smoking 1/2 ppd. Discussed need for cessation to permit advanced HF theparies  5. Cocaine abuse - Reports no further use since  02/2022. - Will need UDS  6. Noncompliance - this has been a major hurdle in his care  I spent a total of 45 minutes today: 1) reviewing the patient's medical records including previous charts, labs and recent notes from other providers; 2) examining the patient and counseling them on their medical issues/explaining the plan of care; 3) adjusting meds as needed and 4) ordering lab work or other needed tests.    Arvilla Meres, MD  4:43 PM

## 2023-12-24 ENCOUNTER — Ambulatory Visit (HOSPITAL_COMMUNITY)
Admission: RE | Admit: 2023-12-24 | Discharge: 2023-12-24 | Disposition: A | Discharge status: Home / Self Care | Payer: 59 | Source: Ambulatory Visit | Attending: Internal Medicine | Admitting: Internal Medicine

## 2023-12-24 ENCOUNTER — Other Ambulatory Visit (HOSPITAL_COMMUNITY): Payer: Self-pay | Admitting: *Deleted

## 2023-12-24 ENCOUNTER — Encounter (HOSPITAL_COMMUNITY): Payer: Self-pay | Admitting: Internal Medicine

## 2023-12-24 VITALS — BP 90/50 | HR 93 | Wt 199.6 lb

## 2023-12-24 DIAGNOSIS — I110 Hypertensive heart disease with heart failure: Secondary | ICD-10-CM | POA: Insufficient documentation

## 2023-12-24 DIAGNOSIS — Z21 Asymptomatic human immunodeficiency virus [HIV] infection status: Secondary | ICD-10-CM

## 2023-12-24 DIAGNOSIS — I5020 Unspecified systolic (congestive) heart failure: Secondary | ICD-10-CM

## 2023-12-24 DIAGNOSIS — I4901 Ventricular fibrillation: Secondary | ICD-10-CM | POA: Diagnosis not present

## 2023-12-24 DIAGNOSIS — Z7984 Long term (current) use of oral hypoglycemic drugs: Secondary | ICD-10-CM | POA: Diagnosis not present

## 2023-12-24 DIAGNOSIS — Z720 Tobacco use: Secondary | ICD-10-CM | POA: Diagnosis not present

## 2023-12-24 DIAGNOSIS — I5023 Acute on chronic systolic (congestive) heart failure: Secondary | ICD-10-CM | POA: Diagnosis not present

## 2023-12-24 DIAGNOSIS — F1410 Cocaine abuse, uncomplicated: Secondary | ICD-10-CM | POA: Insufficient documentation

## 2023-12-24 DIAGNOSIS — I472 Ventricular tachycardia, unspecified: Secondary | ICD-10-CM | POA: Diagnosis not present

## 2023-12-24 DIAGNOSIS — F17210 Nicotine dependence, cigarettes, uncomplicated: Secondary | ICD-10-CM | POA: Insufficient documentation

## 2023-12-24 DIAGNOSIS — I959 Hypotension, unspecified: Secondary | ICD-10-CM | POA: Insufficient documentation

## 2023-12-24 DIAGNOSIS — I428 Other cardiomyopathies: Secondary | ICD-10-CM | POA: Insufficient documentation

## 2023-12-24 DIAGNOSIS — Z79899 Other long term (current) drug therapy: Secondary | ICD-10-CM | POA: Insufficient documentation

## 2023-12-24 DIAGNOSIS — Z91199 Patient's noncompliance with other medical treatment and regimen due to unspecified reason: Secondary | ICD-10-CM | POA: Diagnosis not present

## 2023-12-24 LAB — BASIC METABOLIC PANEL
Anion gap: 14 (ref 5–15)
BUN: 15 mg/dL (ref 8–23)
CO2: 22 mmol/L (ref 22–32)
Calcium: 8.3 mg/dL — ABNORMAL LOW (ref 8.9–10.3)
Chloride: 105 mmol/L (ref 98–111)
Creatinine, Ser: 1.49 mg/dL — ABNORMAL HIGH (ref 0.61–1.24)
GFR, Estimated: 53 mL/min — ABNORMAL LOW (ref >60)
Glucose, Bld: 120 mg/dL — ABNORMAL HIGH (ref 70–99)
Potassium: 3.4 mmol/L — ABNORMAL LOW (ref 3.5–5.1)
Sodium: 141 mmol/L (ref 135–145)

## 2023-12-24 LAB — CBC
HCT: 53.5 % — ABNORMAL HIGH (ref 39.0–52.0)
Hemoglobin: 18.7 g/dL — ABNORMAL HIGH (ref 13.0–17.0)
MCH: 34.4 pg — ABNORMAL HIGH (ref 26.0–34.0)
MCHC: 35 g/dL (ref 30.0–36.0)
MCV: 98.3 fL (ref 80.0–100.0)
Platelets: 173 10*3/uL (ref 150–400)
RBC: 5.44 MIL/uL (ref 4.22–5.81)
RDW: 14 % (ref 11.5–15.5)
WBC: 2.4 10*3/uL — ABNORMAL LOW (ref 4.0–10.5)
nRBC: 0 % (ref 0.0–0.2)

## 2023-12-24 MED ORDER — AMIODARONE HCL 200 MG PO TABS: 200.0000 mg | ORAL_TABLET | Freq: Every day | ORAL | Status: DC

## 2023-12-24 NOTE — Progress Notes (Signed)
 ReDS Vest / Clip - 12/24/23 1200       ReDS Vest / Clip   Station Marker C    Ruler Value 30    ReDS Value Range Low volume    ReDS Actual Value 33

## 2023-12-24 NOTE — Patient Instructions (Addendum)
 Medication Changes:  Take Amiodarone  200 mg daily  Lab Work:  Labs done today, your results will be available in MyChart, we will contact you for abnormal readings.  Testing/Procedures:  Heart Catheterization on Wed 12/26/23, see instructions below   Special Instructions // Education:  Do the following things EVERYDAY: Weigh yourself in the morning before breakfast. Write it down and keep it in a log. Take your medicines as prescribed Eat low salt foods--Limit salt (sodium) to 2000 mg per day.  Stay as active as you can everyday Limit all fluids for the day to less than 2 liters   CATHETERIZATION INSTRUCTIONS:  You are scheduled for a Cardiac Catheterization on Wednesday, January 8 with Dr.  Zenaida .  1. Please arrive at the [facility] (Main Entrance A) at [facility]: [address] [place], [place] [address] at 11:00 AM (This time is 2 hour(s) before your procedure to ensure your preparation).   Free valet parking service is available. You will check in at ADMITTING. The support person will be asked to wait in the waiting room.  It is OK to have someone drop you off and come back when you are ready to be discharged.    Special note: Every effort is made to have your procedure done on time. Please understand that emergencies sometimes delay scheduled procedures.  2. Diet: Do not eat solid foods after midnight.  The patient may have clear liquids until 5am upon the day of the procedure.  3. Labs: DONE TODAY  4. Medication instructions in preparation for your procedure:   WEDNESDAY AM DO NOT TAKE: Furosemide  or Jardiance   On the morning of your procedure, take your Aspirin  81 mg and any morning medicines NOT listed above.  You may use sips of water .  5. Plan to go home the same day, you will only stay overnight if medically necessary. 6. Bring a current list of your medications and current insurance cards. 7. You MUST have a responsible person to drive you home. 8.  Someone MUST be with you the first 24 hours after you arrive home or your discharge will be delayed. 9. Please wear clothes that are easy to get on and off and wear slip-on shoes.  Thank you for allowing us  to care for you!   -- [facility]  **PLEASE BE AWARE YOU MAY BE ADMITTED TO THE HOSPITAL AFTER YOUR PROCEDURE**   Follow-Up in: 3-4 weeks   At the Advanced Heart Failure Clinic, you and your health needs are our priority. We have a designated team specialized in the treatment of Heart Failure. This Care Team includes your primary Heart Failure Specialized Cardiologist (physician), Advanced Practice Providers (APPs- Physician Assistants and Nurse Practitioners), and Pharmacist who all work together to provide you with the care you need, when you need it.   You may see any of the following providers on your designated Care Team at your next follow up:  Dr. Toribio Fuel Dr. Ezra Shuck Dr. Ria Commander Dr. Odis Zenaida Greig Mosses, NP Caffie Shed, [place] [facility] [place], [place] Beckey Coe, NP Jordan Lee, NP Tinnie Redman, PharmD   Please be sure to bring in all your medications bottles to every appointment.   Need to Contact Us :  If you have any questions or concerns before your next appointment please send us  a message through [place] or call our office at [phone].    TO LEAVE A MESSAGE FOR THE NURSE SELECT OPTION 2, PLEASE  LEAVE A MESSAGE INCLUDING: YOUR NAME DATE OF BIRTH CALL BACK NUMBER REASON FOR CALL**this is important as we prioritize the call backs  YOU WILL RECEIVE A CALL BACK THE SAME DAY AS LONG AS YOU CALL BEFORE 4:00 PM

## 2023-12-25 ENCOUNTER — Other Ambulatory Visit: Payer: 59

## 2023-12-25 ENCOUNTER — Telehealth (HOSPITAL_COMMUNITY): Payer: Self-pay | Admitting: *Deleted

## 2023-12-25 NOTE — Telephone Encounter (Signed)
 Received mess from cath lab pt unable to come for cath tomorrow 1/8 as scheduled and pt needs to be resch  Called and spoke w/pt he states unable to come tomorrow request to resch on Mon 1/13, advised Dr Cherrie out of town, he is agreeable to wait. R/L HC resch to 1/16 at 1 pm, updated date/time as well as instructions reviewed with pt via phone, he verbalized understanding.

## 2023-12-26 ENCOUNTER — Other Ambulatory Visit: Payer: 59

## 2023-12-28 ENCOUNTER — Ambulatory Visit: Payer: 59 | Admitting: Physician Assistant

## 2023-12-31 ENCOUNTER — Telehealth (HOSPITAL_COMMUNITY): Payer: Self-pay | Admitting: Cardiology

## 2023-12-31 NOTE — Telephone Encounter (Signed)
 Pt aware to send transmission -pt reports lights are running across screen -call forwarded to device clinic to trouble shoot   Aware of med increase (lasix 40 BID, potassium 20 BID)  Aware to report to ER if symptoms worsen

## 2023-12-31 NOTE — Telephone Encounter (Signed)
 LMOM

## 2023-12-31 NOTE — Telephone Encounter (Signed)
 Patient called to report he is unable to wait for cath. Reports increase in SOB Reports he is SOB all the time, but increased while laying down Unable to sleep  Report mild LE edema  Weight 197 on 1/11 Weight 202 on 1/12 Weight not available 1/13 and too SOB to check during call  Reports he increase lasix  to 40 mg as he was told  Advised would review with cath lab and or provider about changing procedure   Note-DB is out of town until Thursday

## 2024-01-01 ENCOUNTER — Telehealth: Payer: Self-pay

## 2024-01-01 ENCOUNTER — Ambulatory Visit: Payer: 59 | Admitting: Infectious Disease

## 2024-01-01 NOTE — Telephone Encounter (Signed)
 Approved for left and right heart cat per [facility]. [id number] valid through 12/24/23-02/07/2024

## 2024-01-02 ENCOUNTER — Ambulatory Visit: Payer: Self-pay | Admitting: Infectious Disease

## 2024-01-03 ENCOUNTER — Inpatient Hospital Stay (HOSPITAL_COMMUNITY)
Admission: AD | Admit: 2024-01-03 | Discharge: 2024-01-03 | DRG: 286 | Discharge status: Against Medical Advice | Payer: 59 | Attending: Internal Medicine | Admitting: Internal Medicine

## 2024-01-03 ENCOUNTER — Encounter (HOSPITAL_COMMUNITY): Payer: Self-pay | Admitting: Internal Medicine

## 2024-01-03 ENCOUNTER — Other Ambulatory Visit: Payer: Self-pay

## 2024-01-03 ENCOUNTER — Telehealth: Payer: Self-pay | Admitting: *Deleted

## 2024-01-03 ENCOUNTER — Encounter (HOSPITAL_COMMUNITY)
Admission: AD | Discharge status: Against Medical Advice | Payer: Self-pay | Source: Home / Self Care | Attending: Internal Medicine

## 2024-01-03 DIAGNOSIS — Z95810 Presence of automatic (implantable) cardiac defibrillator: Secondary | ICD-10-CM | POA: Diagnosis not present

## 2024-01-03 DIAGNOSIS — F1411 Cocaine abuse, in remission: Secondary | ICD-10-CM | POA: Diagnosis present

## 2024-01-03 DIAGNOSIS — Z821 Family history of blindness and visual loss: Secondary | ICD-10-CM

## 2024-01-03 DIAGNOSIS — I5082 Biventricular heart failure: Secondary | ICD-10-CM | POA: Diagnosis not present

## 2024-01-03 DIAGNOSIS — Z7984 Long term (current) use of oral hypoglycemic drugs: Secondary | ICD-10-CM | POA: Diagnosis not present

## 2024-01-03 DIAGNOSIS — I4901 Ventricular fibrillation: Secondary | ICD-10-CM | POA: Diagnosis not present

## 2024-01-03 DIAGNOSIS — E785 Hyperlipidemia, unspecified: Secondary | ICD-10-CM | POA: Diagnosis not present

## 2024-01-03 DIAGNOSIS — Z83511 Family history of glaucoma: Secondary | ICD-10-CM

## 2024-01-03 DIAGNOSIS — Z818 Family history of other mental and behavioral disorders: Secondary | ICD-10-CM

## 2024-01-03 DIAGNOSIS — I5023 Acute on chronic systolic (congestive) heart failure: Principal | ICD-10-CM

## 2024-01-03 DIAGNOSIS — Z91199 Patient's noncompliance with other medical treatment and regimen due to unspecified reason: Secondary | ICD-10-CM | POA: Diagnosis not present

## 2024-01-03 DIAGNOSIS — I5084 End stage heart failure: Secondary | ICD-10-CM | POA: Diagnosis present

## 2024-01-03 DIAGNOSIS — Z7951 Long term (current) use of inhaled steroids: Secondary | ICD-10-CM | POA: Diagnosis not present

## 2024-01-03 DIAGNOSIS — F17210 Nicotine dependence, cigarettes, uncomplicated: Secondary | ICD-10-CM | POA: Diagnosis not present

## 2024-01-03 DIAGNOSIS — Z21 Asymptomatic human immunodeficiency virus [HIV] infection status: Secondary | ICD-10-CM | POA: Diagnosis present

## 2024-01-03 DIAGNOSIS — Z8249 Family history of ischemic heart disease and other diseases of the circulatory system: Secondary | ICD-10-CM | POA: Diagnosis not present

## 2024-01-03 DIAGNOSIS — I5020 Unspecified systolic (congestive) heart failure: Secondary | ICD-10-CM

## 2024-01-03 DIAGNOSIS — I428 Other cardiomyopathies: Secondary | ICD-10-CM | POA: Diagnosis present

## 2024-01-03 DIAGNOSIS — Z79899 Other long term (current) drug therapy: Secondary | ICD-10-CM | POA: Diagnosis not present

## 2024-01-03 DIAGNOSIS — I110 Hypertensive heart disease with heart failure: Secondary | ICD-10-CM | POA: Diagnosis not present

## 2024-01-03 DIAGNOSIS — I472 Ventricular tachycardia, unspecified: Secondary | ICD-10-CM | POA: Diagnosis not present

## 2024-01-03 DIAGNOSIS — F32A Depression, unspecified: Secondary | ICD-10-CM | POA: Diagnosis present

## 2024-01-03 DIAGNOSIS — Z7982 Long term (current) use of aspirin: Secondary | ICD-10-CM

## 2024-01-03 DIAGNOSIS — Z5329 Procedure and treatment not carried out because of patient's decision for other reasons: Secondary | ICD-10-CM | POA: Diagnosis not present

## 2024-01-03 DIAGNOSIS — J4489 Other specified chronic obstructive pulmonary disease: Secondary | ICD-10-CM | POA: Diagnosis present

## 2024-01-03 DIAGNOSIS — I509 Heart failure, unspecified: Secondary | ICD-10-CM | POA: Diagnosis present

## 2024-01-03 HISTORY — PX: RIGHT/LEFT HEART CATH AND CORONARY ANGIOGRAPHY: CATH118266

## 2024-01-03 LAB — POCT I-STAT EG7
Acid-base deficit: 1 mmol/L (ref 0.0–2.0)
Acid-base deficit: 3 mmol/L — ABNORMAL HIGH (ref 0.0–2.0)
Bicarbonate: 22.7 mmol/L (ref 20.0–28.0)
Bicarbonate: 24.1 mmol/L (ref 20.0–28.0)
Calcium, Ion: 1.15 mmol/L (ref 1.15–1.40)
Calcium, Ion: 1.18 mmol/L (ref 1.15–1.40)
HCT: 53 % — ABNORMAL HIGH (ref 39.0–52.0)
HCT: 53 % — ABNORMAL HIGH (ref 39.0–52.0)
Hemoglobin: 18 g/dL — ABNORMAL HIGH (ref 13.0–17.0)
Hemoglobin: 18 g/dL — ABNORMAL HIGH (ref 13.0–17.0)
O2 Saturation: 60 %
O2 Saturation: 64 %
Potassium: 3.4 mmol/L — ABNORMAL LOW (ref 3.5–5.1)
Potassium: 3.5 mmol/L (ref 3.5–5.1)
Sodium: 145 mmol/L (ref 135–145)
Sodium: 145 mmol/L (ref 135–145)
TCO2: 24 mmol/L (ref 22–32)
TCO2: 25 mmol/L (ref 22–32)
pCO2, Ven: 40.7 mm[Hg] — ABNORMAL LOW (ref 44–60)
pCO2, Ven: 41.6 mm[Hg] — ABNORMAL LOW (ref 44–60)
pH, Ven: 7.355 (ref 7.25–7.43)
pH, Ven: 7.372 (ref 7.25–7.43)
pO2, Ven: 33 mm[Hg] (ref 32–45)
pO2, Ven: 34 mm[Hg] (ref 32–45)

## 2024-01-03 LAB — POCT I-STAT 7, (LYTES, BLD GAS, ICA,H+H)
Acid-base deficit: 5 mmol/L — ABNORMAL HIGH (ref 0.0–2.0)
Bicarbonate: 17.9 mmol/L — ABNORMAL LOW (ref 20.0–28.0)
Calcium, Ion: 0.76 mmol/L — CL (ref 1.15–1.40)
HCT: 44 % (ref 39.0–52.0)
Hemoglobin: 15 g/dL (ref 13.0–17.0)
O2 Saturation: 95 %
Potassium: 2.6 mmol/L — CL (ref 3.5–5.1)
Sodium: 151 mmol/L — ABNORMAL HIGH (ref 135–145)
TCO2: 19 mmol/L — ABNORMAL LOW (ref 22–32)
pCO2 arterial: 29 mm[Hg] — ABNORMAL LOW (ref 32–48)
pH, Arterial: 7.399 (ref 7.35–7.45)
pO2, Arterial: 77 mm[Hg] — ABNORMAL LOW (ref 83–108)

## 2024-01-03 LAB — BASIC METABOLIC PANEL
Anion gap: 7 (ref 5–15)
BUN: 29 mg/dL — ABNORMAL HIGH (ref 8–23)
CO2: 21 mmol/L — ABNORMAL LOW (ref 22–32)
Calcium: 8.5 mg/dL — ABNORMAL LOW (ref 8.9–10.3)
Chloride: 112 mmol/L — ABNORMAL HIGH (ref 98–111)
Creatinine, Ser: 1.42 mg/dL — ABNORMAL HIGH (ref 0.61–1.24)
GFR, Estimated: 56 mL/min — ABNORMAL LOW (ref >60)
Glucose, Bld: 106 mg/dL — ABNORMAL HIGH (ref 70–99)
Potassium: 3.9 mmol/L (ref 3.5–5.1)
Sodium: 140 mmol/L (ref 135–145)

## 2024-01-03 SURGERY — RIGHT/LEFT HEART CATH AND CORONARY ANGIOGRAPHY
Anesthesia: LOCAL

## 2024-01-03 MED ORDER — ACETAMINOPHEN 325 MG PO TABS: 650.0000 mg | ORAL_TABLET | ORAL | Status: DC | PRN

## 2024-01-03 MED ORDER — HEPARIN SODIUM (PORCINE) 5000 UNIT/ML IJ SOLN: 5000.0000 [IU] | Freq: Three times a day (TID) | INTRAMUSCULAR | Status: DC

## 2024-01-03 MED ORDER — ONDANSETRON HCL 4 MG/2ML IJ SOLN: 4.0000 mg | Freq: Four times a day (QID) | INTRAMUSCULAR | Status: DC | PRN

## 2024-01-03 MED ORDER — VERAPAMIL HCL 2.5 MG/ML IV SOLN
INTRAVENOUS | Status: DC | PRN
  Administered 2024-01-03: 10 mL via INTRA_ARTERIAL

## 2024-01-03 MED ORDER — TAMSULOSIN HCL 0.4 MG PO CAPS: 0.8000 mg | ORAL_CAPSULE | Freq: Every day | ORAL | Status: DC

## 2024-01-03 MED ORDER — IOHEXOL 350 MG/ML SOLN
INTRAVENOUS | Status: DC | PRN
  Administered 2024-01-03: 35 mL

## 2024-01-03 MED ORDER — SODIUM CHLORIDE 0.9% FLUSH: 3.0000 mL | Freq: Two times a day (BID) | INTRAVENOUS | Status: DC

## 2024-01-03 MED ORDER — MIDAZOLAM HCL 2 MG/2ML IJ SOLN
INTRAMUSCULAR | Status: DC | PRN
  Administered 2024-01-03: 1 mg via INTRAVENOUS

## 2024-01-03 MED ORDER — FENTANYL CITRATE (PF) 100 MCG/2ML IJ SOLN
INTRAMUSCULAR | Status: DC | PRN
  Administered 2024-01-03: 25 ug via INTRAVENOUS

## 2024-01-03 MED ORDER — VERAPAMIL HCL 2.5 MG/ML IV SOLN
INTRAVENOUS | Status: AC
  Filled 2024-01-03: qty 2

## 2024-01-03 MED ORDER — HEPARIN SODIUM (PORCINE) 1000 UNIT/ML IJ SOLN
INTRAMUSCULAR | Status: AC
  Filled 2024-01-03: qty 10

## 2024-01-03 MED ORDER — MIDAZOLAM HCL 2 MG/2ML IJ SOLN
INTRAMUSCULAR | Status: AC
  Filled 2024-01-03: qty 2

## 2024-01-03 MED ORDER — VALACYCLOVIR HCL 500 MG PO TABS: 500.0000 mg | ORAL_TABLET | Freq: Every day | ORAL | Status: DC

## 2024-01-03 MED ORDER — AMIODARONE HCL 200 MG PO TABS: 200.0000 mg | ORAL_TABLET | Freq: Every day | ORAL | Status: DC

## 2024-01-03 MED ORDER — FENTANYL CITRATE (PF) 100 MCG/2ML IJ SOLN
INTRAMUSCULAR | Status: AC
  Filled 2024-01-03: qty 2

## 2024-01-03 MED ORDER — SODIUM CHLORIDE 0.9 % IV SOLN
250.0000 mL | INTRAVENOUS | Status: DC | PRN
Start: 2024-01-03 — End: 2024-01-04

## 2024-01-03 MED ORDER — FLUTICASONE FUROATE-VILANTEROL 200-25 MCG/ACT IN AEPB: 1.0000 | INHALATION_SPRAY | Freq: Every day | RESPIRATORY_TRACT | Status: DC

## 2024-01-03 MED ORDER — EMPAGLIFLOZIN 10 MG PO TABS: 10.0000 mg | ORAL_TABLET | Freq: Every day | ORAL | Status: DC

## 2024-01-03 MED ORDER — SODIUM CHLORIDE 0.9% FLUSH: 3.0000 mL | INTRAVENOUS | Status: DC | PRN

## 2024-01-03 MED ORDER — BICTEGRAVIR-EMTRICITAB-TENOFOV 50-200-25 MG PO TABS: 1.0000 | ORAL_TABLET | Freq: Every day | ORAL | Status: DC

## 2024-01-03 MED ORDER — LIDOCAINE HCL (PF) 1 % IJ SOLN
INTRAMUSCULAR | Status: AC
  Filled 2024-01-03: qty 30

## 2024-01-03 MED ORDER — FUROSEMIDE 10 MG/ML IJ SOLN: 80.0000 mg | Freq: Two times a day (BID) | INTRAMUSCULAR | Status: DC

## 2024-01-03 MED ORDER — HEPARIN (PORCINE) IN NACL 1000-0.9 UT/500ML-% IV SOLN
INTRAVENOUS | Status: DC | PRN
  Administered 2024-01-03 (×2): 500 mL

## 2024-01-03 MED ORDER — ALBUTEROL SULFATE HFA 108 (90 BASE) MCG/ACT IN AERS: 2.0000 | INHALATION_SPRAY | Freq: Four times a day (QID) | RESPIRATORY_TRACT | Status: DC | PRN

## 2024-01-03 MED ORDER — HEPARIN SODIUM (PORCINE) 1000 UNIT/ML IJ SOLN
INTRAMUSCULAR | Status: DC | PRN
  Administered 2024-01-03: 4000 [IU] via INTRAVENOUS

## 2024-01-03 MED ORDER — SODIUM CHLORIDE 0.9 % IV SOLN: INTRAVENOUS | Status: DC

## 2024-01-03 MED ORDER — LIDOCAINE HCL (PF) 1 % IJ SOLN
INTRAMUSCULAR | Status: DC | PRN
  Administered 2024-01-03: 5 mL
  Administered 2024-01-03: 2 mL

## 2024-01-03 SURGICAL SUPPLY — 10 items
CATH 5FR JL3.5 JR4 ANG PIG MP (CATHETERS) IMPLANT
CATH SWAN GANZ 7F STRAIGHT (CATHETERS) IMPLANT
DEVICE RAD COMP TR BAND LRG (VASCULAR PRODUCTS) IMPLANT
GLIDESHEATH SLEND SS 6F .021 (SHEATH) IMPLANT
GLIDESHEATH SLENDER 7FR .021G (SHEATH) IMPLANT
GUIDEWIRE INQWIRE 1.5J.035X260 (WIRE) IMPLANT
INQWIRE 1.5J .035X260CM (WIRE) ×1
KIT HEART LEFT (KITS) IMPLANT
PACK CARDIAC CATHETERIZATION (CUSTOM PROCEDURE TRAY) ×1 IMPLANT
TRANSDUCER W/STOPCOCK (MISCELLANEOUS) IMPLANT

## 2024-01-03 NOTE — Interval H&P Note (Signed)
History and Physical Interval Note:  01/03/2024 2:57 PM  Ryan Long  has presented today for surgery, with the diagnosis of heart failure.  The various methods of treatment have been discussed with the patient and family. After consideration of risks, benefits and other options for treatment, the patient has consented to  Procedure(s): RIGHT/LEFT HEART CATH AND CORONARY ANGIOGRAPHY (N/A) as a surgical intervention.  The patient's history has been reviewed, patient examined, no change in status, stable for surgery.  I have reviewed the patient's chart and labs.  Questions were answered to the patient's satisfaction.     [name]

## 2024-01-03 NOTE — Telephone Encounter (Signed)
Attempted to call pt to confirm his R/L HC sch for today at 1pm with 11 am arrival, left VM if unable to make it to please let us know and we can resch

## 2024-01-03 NOTE — H&P (Addendum)
Advanced Heart Failure Team History and Physical Note   PCP:  Jackie Plum, MD  PCP-Cardiology: None     Reason for Admission: Acute on chronic systolic CHF   HPI:   Ryan Long is a 63 y.o. male with HIV, HTN, major depression, chronic systolic heart failure (nonischemic cardiomyopathy), and polysubstance abuse.     Coronary CT 3/12 -> no CAD.  Cardiolite in 2015 showed no ischemia or infarction.  NICM was not felt to be related to HIV given his good CD4 counts. He is S/P St Jude ICD 12/2017.   Echo 09/2017 EF 25-30%    He was evaluated [facility] 1/19 with suicidal ideation. He was placed on IVC. He was later cleared and discharged.    Echo 04/20/21 EF 20-25% mild MR. RV ok    In 5/22 referred for CPX test to objectively quantify exercise capacity. Unfortunately, he did not complete study and was lost to f/u   Saw EP 9/22 and device interrogation showed NSVT. Seen in ED 10/22 after defib fired x 2. Interrogation showed WCT ?VT. Cards consulted and recommended starting amiodarone, but he left AMA.   Seen in HF clinic in 12/13 for first time since 5/22. Doing ok. Echo ordered but he did not f/u.   Seen in ED 1/24 with dizziness and diaphoresis after using cocaine.    Seen in ED 09/18/23 with syncope, left AMA.   Echo 09/28/23, EF 20-25%, RV ok   Scheduled for CPX in 10/24 but did not show says he just can't do it.    ICD interrogation in 10/24 reported ATP for VT. Told to increase Amio to 200 bid but says he didn't get the message. Seen in EP Clinic in 11/24. At that visit reported that PCP cut amio to 100 daily and cut lasix to 10mg  daily, Toprol to 25 daily and stopped eplernone. ICD interrogated and found to have one episode of VT/VF with shock on 09/18/23. No other episodes despite persistent dizziness. Amio increased to 200 bid then back to 200 daily.   Se[facility]  on 11/14/23 stopped Entresto, eplerenone and cut Toprol back due to low BP. Also stopped amio       Recently seen for follow-up. Says he hasn't felt right since his VT episode in 10/24. Very SOB with any activity. Using a scooter to get around. Says he has no energy.    R/LHC today: normal cors, severe biventricular dysfunction, elevated filling pressures w/ low cardiac output. He is being admitted for HF optimization and workup for advanced therapies.    No cocaine > 17yr. Smokes 8-10 cigs/day       Home Medications Prior to Admission medications   Medication Sig Start Date End Date Taking? Authorizing Provider  albuterol (VENTOLIN HFA) 108 (90 Base) MCG/ACT inhaler Inhale 2 puffs into the lungs every 6 (six) hours as needed for wheezing or shortness of breath. 07/30/23  Yes Storm Frisk, MD  aspirin EC 81 MG tablet Take 81 mg by mouth daily. Swallow whole.   Yes [provider]  bictegravir-emtricitabine-tenofovir AF (BIKTARVY) 50-200-25 MG TABS tablet Take 1 tablet by mouth daily. 08/15/23  Yes Daiva Eves, Lisette Grinder, MD  empagliflozin (JARDIANCE) 10 MG TABS tablet Take 1 tablet (10 mg total) by mouth daily before breakfast. 12/01/22  Yes Milford, Anderson Malta, FNP  finasteride (PROSCAR) 5 MG tablet TAKE 1 TABLET(5 MG) BY MOUTH DAILY 03/15/23  Yes Storm Frisk, MD  fluticasone furoate-vilanterol (BREO ELLIPTA) 200-25  MCG/ACT AEPB Inhale 1 puff into the lungs daily.   Yes [provider]  furosemide (LASIX) 20 MG tablet Take 2 tablets (40 mg total) by mouth 2 (two) times daily. Patient taking differently: Take 30-40 mg by mouth See admin instructions. Take 40 mg in the morning and 30 mg at night 12/18/23 12/17/24 Yes Milford, Jessica M, [place]  Melatonin 10 MG TABS Take 10 mg by mouth at bedtime.   Yes [provider]  metoprolol succinate (TOPROL-XL) 50 MG 24 hr tablet Take 25 mg by mouth daily. Take with or immediately following a meal.   Yes [provider]  Multiple Vitamins-Minerals (MENS MULTIPLUS PO) Take 1 tablet by mouth daily.   Yes [provider]  nicotine (NICODERM CQ - DOSED IN MG/24 HOURS) 14 mg/24hr patch Place 1 patch (14 mg total) onto the skin daily. 09/12/23  Yes Daiva Eves, Lisette Grinder, MD  nicotine (NICODERM CQ - DOSED IN MG/24 HOURS) 21 mg/24hr patch Place 1 patch (21 mg total) onto the skin daily. 09/12/23  Yes Daiva Eves, Lisette Grinder, MD  potassium chloride SA (KLOR-CON M) 20 MEQ tablet Take 2 tablets (40 mEq total) by mouth daily. Patient taking differently: Take 20 mEq by mouth 2 (two) times daily. 12/18/23  Yes Milford, Anderson Malta, FNP  sildenafil (VIAGRA) 100 MG tablet Take 100 mg by mouth daily as needed for erectile dysfunction.   Yes [provider]  tamsulosin (FLOMAX) 0.4 MG CAPS capsule TAKE 2 CAPSULES(0.8 MG) BY MOUTH DAILY AFTER SUPPER Patient taking differently: Take 0.4 mg by mouth 2 (two) times daily. 07/26/23  Yes Storm Frisk, MD  triamcinolone ointment (KENALOG) 0.5 % Apply 1 Application topically 2 (two) times daily. 08/15/23  Yes Storm Frisk, MD  valACYclovir (VALTREX) 500 MG tablet Take 1 tablet (500 mg total) by mouth daily. 11/15/22  Yes Storm Frisk, MD  amiodarone (PACERONE) 200 MG tablet Take 1 tablet (200 mg total) by mouth daily. 12/24/23   Andras Grunewald, Bevelyn Buckles, MD  nicotine (NICODERM CQ - DOSED IN MG/24 HR) 7 mg/24hr patch Place 1 patch (7 mg total) onto the skin daily. 09/12/23   Randall Hiss, MD    Past Medical History: Past Medical History:  Diagnosis Date   Active smoker    AICD (automatic cardioverter/defibrillator) present    Alcohol abuse    Allergy July 2023   Anxiety    AR (allergic rhinitis)    Bipolar 1 disorder (HCC)    Cervical lymphadenitis 04/20/2021   CHF (congestive heart failure) (HCC)    Chronic bronchitis (HCC)    Chronic systolic heart failure (HCC)    Controlled substance agreement signed 10/22/2018   COPD (chronic obstructive pulmonary disease) (HCC)    Cough 12/31/2018   Crack cocaine use    Depression    Genital herpes    HIV  (human immunodeficiency virus infection) (HCC) dx'd 08/1993   HLD (hyperlipidemia)    Hypertension    NICM (nonischemic cardiomyopathy) (HCC)    Echocardiogram 06/28/11: EF 30-35%, mild MR, mild LAE;  No CAD by coronary CT angiogram 3/12 at [facility]   NSVT (nonsustained ventricular tachycardia) (HCC)    PTSD (post-traumatic stress disorder)     Past Surgical History: Past Surgical History:  Procedure Laterality Date   CARDIAC DEFIBRILLATOR PLACEMENT  01/08/2018   ICD IMPLANT N/A 01/08/2018   Procedure: ICD IMPLANT;  Surgeon: Regan Lemming, MD;  Location: [facility] INVASIVE CV LAB;  Service: Cardiovascular;  Laterality: N/A;    Family History:  Family History  Problem Relation Age of Onset   Glaucoma Mother    Mental illness Mother    Vision loss Mother    Hypertension Father    CAD Father    Mental illness Father    Alcohol abuse Father    Drug abuse Father    Heart attack Father 56   Early death Father    Heart disease Father    Alcohol abuse Brother    Drug abuse Brother    Drug abuse Brother     Social History: Social History   Socioeconomic History   Marital status: Single    Spouse name: Not on file   Number of children: Not on file   Years of education: Not on file   Highest education level: GED or equivalent  Occupational History   Not on file  Tobacco Use   Smoking status: Every Day    Current packs/day: 0.75    Average packs/day: 0.8 packs/day for 42.0 years (31.5 ttl pk-yrs)    Types: Cigarettes   Smokeless tobacco: Never   Tobacco comments:    hoping welbutrin will help  Vaping Use   Vaping status: Never Used  Substance and Sexual Activity   Alcohol use: Not Currently    Alcohol/week: 6.0 standard drinks of alcohol    Comment: 01/08/2018 "stopped 06/2017"   Drug use: Not Currently    Types: "Crack" cocaine    Comment: 01/08/2018 "stopped 06/2017"   Sexual activity: Yes    Birth control/protection: Condom    Comment: pt.  declined condoms  Other Topics Concern   Not on file  Social History Narrative   Not on file   Social Drivers of Health   Financial Resource Strain: Low Risk  (07/17/2023)   Overall Financial Resource Strain (CARDIA)    Difficulty of Paying Living Expenses: Not hard at all  Food Insecurity: Low Risk  (10/26/2023)   Received from [facility]   Hunger Vital Sign    Worried About Running Out of Food in the Last Year: Never true    Ran Out of Food in the Last Year: Never true  Transportation Needs: No Transportation Needs (10/26/2023)   Received from [employer]    In the past 12 months, has lack of reliable transportation kept you from medical appointments, meetings, work or from getting things needed for daily living? : No  Physical Activity: Insufficiently Active (07/17/2023)   Exercise Vital Sign    Days of Exercise per Week: 3 days    Minutes of Exercise per Session: 20 min  Stress: Stress Concern Present (07/17/2023)   [employer] of Occupational Health - Occupational Stress Questionnaire    Feeling of Stress : To some extent  Social Connections: Moderately Integrated (07/17/2023)   Social Connection and Isolation Panel [NHANES]    Frequency of Communication with Friends and Family: Twice a week    Frequency of Social Gatherings with Friends and Family: More than three times a week    Attends Religious Services: 1 to 4 times per year    Active Member of [employer] or Organizations: Yes    Attends Engineer, structural: More than 4 times per year    Marital Status: Never married    Allergies:  No Known Allergies  Objective:    Vital Signs:   Temp:  [97.9 F (36.6 C)] 97.9 F (36.6 C) (01/16 1126) Pulse Rate:  [80-116] 80 (01/16  1745) Resp:  [14-41] 18 (01/16 1745) BP: (96-127)/(72-90) 107/72 (01/16 1745) SpO2:  [94 %-98 %] 95 % (01/16 1745) Weight:  [87 kg] 87 kg (01/16 1126)   Filed Weights   01/03/24 1126  Weight: 87 kg     Physical  Exam     General:  Weak appearing. HEENT: Normal Neck: Supple. JVP 10 Cor: PMI nondisplaced. Regular rate & rhythm, S3.  Lungs: Clear Abdomen: Soft, nontender, nondistended.  Extremities: No cyanosis, clubbing, rash, edema Neuro: Alert & oriented x 3. Affect pleasant.   Telemetry   Not currently on tele  EKG   Sinus tach 106 bpm, PVCs  Labs     Basic Metabolic Panel: Recent Labs  Lab 01/03/24 1212 01/03/24 1506 01/03/24 1511 01/03/24 1512  NA 140 151* 145 145  K 3.9 2.6* 3.5 3.4*  CL 112*  --   --   --   CO2 21*  --   --   --   GLUCOSE 106*  --   --   --   BUN 29*  --   --   --   CREATININE 1.42*  --   --   --   CALCIUM 8.5*  --   --   --     Liver Function Tests: No results for input(s): "AST", "ALT", "ALKPHOS", "BILITOT", "PROT", "ALBUMIN" in the last 168 hours. No results for input(s): "LIPASE", "AMYLASE" in the last 168 hours. No results for input(s): "AMMONIA" in the last 168 hours.  CBC: Recent Labs  Lab 01/03/24 1506 01/03/24 1511 01/03/24 1512  HGB 15.0 18.0* 18.0*  HCT 44.0 53.0* 53.0*    Cardiac Enzymes: No results for input(s): "CKTOTAL", "CKMB", "CKMBINDEX", "TROPONINI" in the last 168 hours.  BNP: BNP (last 3 results) Recent Labs    09/28/23 0939  BNP 189.3*    ProBNP (last 3 results) No results for input(s): "PROBNP" in the last 8760 hours.   CBG: No results for input(s): "GLUCAP" in the last 168 hours.  Coagulation Studies: No results for input(s): "LABPROT", "INR" in the last 72 hours.  Imaging: CARDIAC CATHETERIZATION Result Date: 01/03/2024 Findings: Ao =88/68 (78) LV = 86/26 RA = 10 RV = 45/13 PA = 40/28 (34) PCW = 24 Thermo CO/CI = 3.8/1.9 Fick CO/CI = 3.2/1.6 PVR = 3.1 (Fick) 2.7 (TD) Ao sat = 95% PA sat = 64%, 60% PAPi = 1.2 Assessment: 1. Normal coronaries 2. Severe NICM with severe biventricular dysfunction LVEF 25% 3. Elevated filling pressures with low cardiac output Plan/Discussion: Admit for HF optimization and  assessment for candidacy for advanced therapies. Arvilla Meres, MD 4:09 PM    Patient Profile    Assessment/Plan   1. Acute on Chronic Systolic Heart Failure:  - NICM - Echo (09/2017): EF 20-25%.  - Echo (04/20/21): EF 20-25% mild MR. RV ok  - S/P St Jude ICD.  - Echo 09/28/23, EF 20-25%, RV ok - Scheduled for CPX in 10/24 but says he didn't show because he felt to poorly to walk onTM - [facility] 01/03/24: Normal cors, severe biventricular dysfunction, elevated filling pressures, TD CI 1.9, Fick CI 1.6, PAPi 1.2 - NYHA IV - Diurese with IV lasix 80 BID. Will attempt to try to diurese without inotrope support. Not sure that he would be a candidate for home inotrope. - Continue Jardiance 10 mg daily. - Off Entresto 24/26 mg bid and eplernone 12.5 due to low BP (stopped at [facility]) - Stop beta blocker - BP has been too low to  add back GDMT.  - Appears end-stage. Will need to consider if he is candidate for VAD (not candidate for transplant with tobacco use)   2. NSVT/VT/VF - Frequent NSVT 8/22 - Had VT/VF with shock 09/18/23. Seen by EP. Amio increased to 200 bid for several weeks/ Back to 200 daily. No driving x 6 months  - Continue amiodarone 200 mg daily - Stop Toprol XL d/t low-output - Follow on tele   3. HIV  - He follows with ID.    - continue current regimen   4. Tobacco use disorder   - Still smoking 1/2 ppd. Discussed need for cessation to permit advanced HF theparies   5. Cocaine abuse - Reports no further use since 02/2022. - Will need UDS   6. Noncompliance - this has been a major hurdle in his care   Consult HF TOC CSW/CM to assist with SDOH/resources.    FINCH, LINDSAY N, PA-C 01/03/2024, 5:57 PM  Advanced Heart Failure Team Pager [phone] (M-F; 7a - 5p)  Please contact [facility] for night-coverage after hours (4p -7a ) and weekends on [email or web address]  Patient seen and examined with the above-signed Advanced Practice Provider and/or Housestaff. I  personally reviewed laboratory data, imaging studies and relevant notes. I independently examined the patient and formulated the important aspects of the plan. I have edited the note to reflect any of my changes or salient points. I have personally discussed the plan with the patient and/or family.  63 y/o male with severe NICM, HIV, NSVT, tobacco use, noncompliance and previous substance use.   Presented today for outpatient R/L heart cath due to progressive NYHA IV symptoms.   Had to come by [facility] Goad because no transportation.   Cath with elevated filling pressures and low output. Being admitted for HF optimization and possible consideration of advanced therapies.   Now in holding room threatening to sign out AMA  General:  Sitting up in bed  No resp difficulty HEENT: normal Neck: supple. JBP 9-10 Carotids 2+ bilat; no bruits. No lymphadenopathy or thryomegaly appreciated. Cor: PMI nondisplaced. Regular rate & rhythm. No rubs, gallops or murmurs. Lungs: clear Abdomen: soft, nontender, nondistended. No hepatosplenomegaly. No bruits or masses. Good bowel sounds. Extremities: no cyanosis, clubbing, rash, tr edema Neuro: alert & orientedx3, cranial nerves grossly intact. moves all 4 extremities w/o difficulty. Affect pleasant   He has end-stage biventricular HF confirmed by cath findings. Unfortunately will not be candidate for advanced therapies due to social issues   Would attempt to diurese and titrate GDMT as tolerated. Stop b-blocker.  No role for inotropes given social situation and lack of long-term options.   Arvilla Meres, MD  7:03 PM

## 2024-01-03 NOTE — Progress Notes (Signed)
Pt began to get agitated, stated he wanted to leave AMA instead of waiting for a bed.  Educated patient on risks of leaving with the TR band still inflated, pt agreed to wait until band was removed.  Bensimhon, MD notified.  After band removal, site was nontender, no hematoma, no oozing or bleeding.  Gauze and tegaderm applied.  Pt educated on safety precautions, emergent signs and symptoms of bleeding or cardiac issues, and risks of leaving AMA.  Pt verbalized understanding, all questions answered, AMA form signed.  Left forearm IV removed, pt assisted in dressing.  Pt arranged private transport by [facility].  Pt escorted to [employer] entrance by wheelchair, and assisted into vehicle.  NAD.

## 2024-01-03 NOTE — Progress Notes (Signed)
Pt had two runs of Vtach, 1 x 8 beats @ 1755, 1 x 6 beats @ 1803.  Bensimhon, MD notified.  No orders given, will come to bedside to see the patient.
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
## 2024-01-04 ENCOUNTER — Encounter (HOSPITAL_COMMUNITY): Payer: Self-pay

## 2024-01-07 ENCOUNTER — Other Ambulatory Visit: Payer: 59

## 2024-01-08 ENCOUNTER — Ambulatory Visit: Payer: 59 | Admitting: Infectious Disease

## 2024-01-09 ENCOUNTER — Encounter: Payer: 59 | Admitting: Infectious Disease

## 2024-01-10 ENCOUNTER — Other Ambulatory Visit: Payer: Self-pay

## 2024-01-10 DIAGNOSIS — Z113 Encounter for screening for infections with a predominantly sexual mode of transmission: Secondary | ICD-10-CM

## 2024-01-10 DIAGNOSIS — B20 Human immunodeficiency virus [HIV] disease: Secondary | ICD-10-CM

## 2024-01-10 DIAGNOSIS — Z79899 Other long term (current) drug therapy: Secondary | ICD-10-CM

## 2024-01-14 ENCOUNTER — Encounter (HOSPITAL_COMMUNITY): Payer: Self-pay | Admitting: Internal Medicine

## 2024-01-14 ENCOUNTER — Ambulatory Visit (HOSPITAL_COMMUNITY)
Admission: RE | Admit: 2024-01-14 | Discharge: 2024-01-14 | Disposition: A | Discharge status: Home / Self Care | Payer: 59 | Source: Ambulatory Visit | Attending: Internal Medicine | Admitting: Internal Medicine

## 2024-01-14 ENCOUNTER — Encounter (HOSPITAL_COMMUNITY): Payer: 59

## 2024-01-14 VITALS — BP 98/68 | HR 100 | Wt 190.0 lb

## 2024-01-14 DIAGNOSIS — F17210 Nicotine dependence, cigarettes, uncomplicated: Secondary | ICD-10-CM | POA: Diagnosis not present

## 2024-01-14 DIAGNOSIS — I5022 Chronic systolic (congestive) heart failure: Secondary | ICD-10-CM

## 2024-01-14 DIAGNOSIS — B20 Human immunodeficiency virus [HIV] disease: Secondary | ICD-10-CM | POA: Diagnosis not present

## 2024-01-14 DIAGNOSIS — I472 Ventricular tachycardia, unspecified: Secondary | ICD-10-CM

## 2024-01-14 DIAGNOSIS — I4729 Other ventricular tachycardia: Secondary | ICD-10-CM | POA: Diagnosis not present

## 2024-01-14 DIAGNOSIS — I428 Other cardiomyopathies: Secondary | ICD-10-CM | POA: Insufficient documentation

## 2024-01-14 DIAGNOSIS — F1411 Cocaine abuse, in remission: Secondary | ICD-10-CM | POA: Diagnosis not present

## 2024-01-14 DIAGNOSIS — Z720 Tobacco use: Secondary | ICD-10-CM | POA: Diagnosis not present

## 2024-01-14 DIAGNOSIS — Z91199 Patient's noncompliance with other medical treatment and regimen due to unspecified reason: Secondary | ICD-10-CM | POA: Diagnosis not present

## 2024-01-14 DIAGNOSIS — I5023 Acute on chronic systolic (congestive) heart failure: Secondary | ICD-10-CM | POA: Insufficient documentation

## 2024-01-14 DIAGNOSIS — I110 Hypertensive heart disease with heart failure: Secondary | ICD-10-CM | POA: Diagnosis not present

## 2024-01-14 DIAGNOSIS — F329 Major depressive disorder, single episode, unspecified: Secondary | ICD-10-CM | POA: Insufficient documentation

## 2024-01-14 LAB — BASIC METABOLIC PANEL
Anion gap: 11 (ref 5–15)
BUN: 11 mg/dL (ref 8–23)
CO2: 24 mmol/L (ref 22–32)
Calcium: 8.3 mg/dL — ABNORMAL LOW (ref 8.9–10.3)
Chloride: 105 mmol/L (ref 98–111)
Creatinine, Ser: 1.25 mg/dL — ABNORMAL HIGH (ref 0.61–1.24)
GFR, Estimated: >60 mL/min (ref >60)
Glucose, Bld: 91 mg/dL (ref 70–99)
Potassium: 3.3 mmol/L — ABNORMAL LOW (ref 3.5–5.1)
Sodium: 140 mmol/L (ref 135–145)

## 2024-01-14 LAB — BRAIN NATRIURETIC PEPTIDE: B Natriuretic Peptide: 836 pg/mL — ABNORMAL HIGH (ref 0.0–100.0)

## 2024-01-14 LAB — MAGNESIUM: Magnesium: 2.1 mg/dL (ref 1.7–2.4)

## 2024-01-14 MED ORDER — AMIODARONE HCL 200 MG PO TABS: 200.0000 mg | ORAL_TABLET | Freq: Every day | ORAL | Status: DC

## 2024-01-14 NOTE — Patient Instructions (Signed)
Medication Changes:  RESTART Amiodarone 200 mg Daily  Lab Work:  Labs done today, your results will be available in MyChart, we will contact you for abnormal readings.  Special Instructions // Education:  Do the following things EVERYDAY: Weigh yourself in the morning before breakfast. Write it down and keep it in a log. Take your medicines as prescribed Eat low salt foods--Limit salt (sodium) to 2000 mg per day.  Stay as active as you can everyday Limit all fluids for the day to less than 2 liters   Follow-Up in: 2 months   At the [facility] Failure Clinic, you and your health needs are our priority. We have a designated team specialized in the treatment of Heart Failure. This Care Team includes your primary Heart Failure Specialized Cardiologist (physician), Advanced Practice Providers (APPs- Physician Assistants and Nurse Practitioners), and Pharmacist who all work together to provide you with the care you need, when you need it.   You may see any of the following providers on your designated Care Team at your next follow up:  Dr. Arvilla Meres Dr. Marca Ancona Dr. Dorthula Nettles Dr. Theresia Bough [name], NP Robbie Lis, [place] [facility] [place], [place] Brynda Peon, NP [place] Lee, NP Karle Plumber, PharmD   Please be sure to bring in all your medications bottles to every appointment.   Need to Contact us:  If you have any questions or concerns before your next appointment please send [place] a message through [place] or call our office at [phone].    TO LEAVE A MESSAGE FOR THE NURSE SELECT OPTION 2, PLEASE LEAVE A MESSAGE INCLUDING: YOUR NAME DATE OF BIRTH CALL BACK NUMBER REASON FOR CALL**this is important as we prioritize the call backs  YOU WILL RECEIVE A CALL BACK THE SAME DAY AS LONG AS YOU CALL BEFORE 4:00 PM

## 2024-01-14 NOTE — Progress Notes (Incomplete)
Advanced Heart Failure Clinic Note   Patient ID: Ryan Long, male   DOB: 18-Mar-1961, 63 y.o.   MRN: [id number] ID: Ryan Long PCP: Ryan Long EP : Ryan Long  HF: Ryan Long is a 63 y.o. male with HIV, HTN, major depression, chronic systolic heart failure (nonischemic cardiomyopathy), and polysubstance abuse.    Coronary CT 3/12 -> no CAD.  Cardiolite in 2015 showed no ischemia or infarction.  NICM was not felt to be related to HIV given his good CD4 counts. He is S/P St Jude ICD 12/2017.  Echo 09/2017 EF 25-30%   He was evaluated [facility] 1/19 with suicidal ideation. He was placed on IVC. He was later cleared and discharged.   Echo 04/20/21 EF 20-25% mild MR. RV ok   In 5/22 referred for CPX test to objectively quantify exercise capacity. Unfortunately, he did not complete study and was lost to f/u  Saw EP 9/22 and device interrogation showed NSVT. Seen in ED 10/22 after defib fired x 2. Interrogation showed WCT ?VT. Cards consulted and recommended starting amiodarone, but he left AMA.  Seen in HF clinic in 12/13 for first time since 5/22. Doing ok. Echo ordered but he did not f/u.  Seen in ED 1/24 with dizziness and diaphoresis after using cocaine.   Seen in ED 09/18/23 with syncope, left AMA.  Echo 09/28/23, EF 20-25%, RV ok  Scheduled for CPX in 10/24 but did not show says he just can't do it.   ICD interrogation in 10/24 reported ATP for VT. Told to increase Amio to 200 bid but says he didn't get the message. Seen in EP Clinic in 11/24. At that visit reported that PCP cut amio to 100 daily and cut lasix to 10mg  daily, Toprol to 25 daily and stopped eplernone. ICD interrogated and found to have one episode of VT/VF with chock on 09/18/23. No other episodes despite persistent dizziness. Amio increased to 200 bid then back to 200 daily.   Seen at [facility]  on 11/14/23 stopped Entresto, eplerenone and cut Toprol back due to low BP. Also stopped amio      Had R/L cath 01/03/24. Normal coronaries  Ao =88/68 (78) LV = 86/26 RA = 10 RV = 45/13 PA = 40/28 (34) PCW = 24 Thermo CO/CI = 3.8/1.9 Fick CO/CI = 3.2/1.6 PVR = 3.1 (Fick) 2.7 (TD) Ao sat = 95% PA sat = 64%, 60% PAPi = 1.2  Underwent R/L cath for low output symptoms 01/03/24. Wanted to admit him for evaluation for advanced therapies but he left AMA. Gets winded easily. Uses a scooter. No CP. Had episode of near syncope yesterday. Not taking amiodarone. Smoking about 8-9 cigarettes per day. Doing a little THC. Denies cocaine  ICD interrogation: 1/26 VT at 210 x 18s broke with ATP. 1/24 VT/VF 226 x 9s broke with ATP. Multiple episodes NSVT> Fluid ok. Activity level 4hr/day   Cardiac Studies: - Echo (5/22): EF 20-25%, mild MR, RV ok  - Echo (10/18): EF 25-30%  - Echo (9/16): EF 30-35%, diffuse hypokinesis, grade I diastolic dysfunction  - Echo (4/09): EF 35-40%  Past Medical History:  Diagnosis Date   Active smoker    AICD (automatic cardioverter/defibrillator) present    Alcohol abuse    Allergy July 2023   Anxiety    AR (allergic rhinitis)    Bipolar 1 disorder (HCC)    Cervical lymphadenitis 04/20/2021   CHF (congestive heart failure) (HCC)  Chronic bronchitis (HCC)    Chronic systolic heart failure (HCC)    Controlled substance agreement signed 10/22/2018   COPD (chronic obstructive pulmonary disease) (HCC)    Cough 12/31/2018   Crack cocaine use    Depression    Genital herpes    HIV (human immunodeficiency virus infection) (HCC) dx'd 08/1993   HLD (hyperlipidemia)    Hypertension    NICM (nonischemic cardiomyopathy) (HCC)    Echocardiogram 06/28/11: EF 30-35%, mild MR, mild LAE;  No CAD by coronary CT angiogram 3/12 at [facility]   NSVT (nonsustained ventricular tachycardia) (HCC)    PTSD (post-traumatic stress disorder)    Current Outpatient Medications  Medication Sig Dispense Refill   albuterol (VENTOLIN HFA) 108 (90 Base) MCG/ACT  inhaler Inhale 2 puffs into the lungs every 6 (six) hours as needed for wheezing or shortness of breath. 6.7 g 3   aspirin EC 81 MG tablet Take 81 mg by mouth daily. Swallow whole.     bictegravir-emtricitabine-tenofovir AF (BIKTARVY) 50-200-25 MG TABS tablet Take 1 tablet by mouth daily. 30 tablet 11   empagliflozin (JARDIANCE) 10 MG TABS tablet Take 1 tablet (10 mg total) by mouth daily before breakfast. 30 tablet 11   finasteride (PROSCAR) 5 MG tablet TAKE 1 TABLET(5 MG) BY MOUTH DAILY 30 tablet 0   fluticasone furoate-vilanterol (BREO ELLIPTA) 200-25 MCG/ACT AEPB Inhale 1 puff into the lungs daily.     furosemide (LASIX) 20 MG tablet Take 40 mg by mouth daily.     Melatonin 10 MG TABS Take 10 mg by mouth at bedtime.     metoprolol succinate (TOPROL-XL) 50 MG 24 hr tablet Take 25 mg by mouth daily. Take with or immediately following a meal.     Multiple Vitamins-Minerals (MENS MULTIPLUS PO) Take 1 tablet by mouth daily.     potassium chloride SA (KLOR-CON M) 20 MEQ tablet Take 2 tablets (40 mEq total) by mouth daily. 60 tablet 3   sildenafil (VIAGRA) 100 MG tablet Take 100 mg by mouth daily as needed for erectile dysfunction.     tamsulosin (FLOMAX) 0.4 MG CAPS capsule TAKE 2 CAPSULES(0.8 MG) BY MOUTH DAILY AFTER SUPPER (Patient taking differently: Take 0.4 mg by mouth 2 (two) times daily.) 60 capsule 5   triamcinolone ointment (KENALOG) 0.5 % Apply 1 Application topically 2 (two) times daily. 30 g 3   valACYclovir (VALTREX) 500 MG tablet Take 1 tablet (500 mg total) by mouth daily. 60 tablet 4   nicotine (NICODERM CQ - DOSED IN MG/24 HOURS) 14 mg/24hr patch Place 1 patch (14 mg total) onto the skin daily. (Patient not taking: Reported on 01/14/2024) 14 patch 0   nicotine (NICODERM CQ - DOSED IN MG/24 HOURS) 21 mg/24hr patch Place 1 patch (21 mg total) onto the skin daily. (Patient not taking: Reported on 01/14/2024) 28 patch 3   nicotine (NICODERM CQ - DOSED IN MG/24 HR) 7 mg/24hr patch Place 1  patch (7 mg total) onto the skin daily. (Patient not taking: Reported on 01/14/2024) 7 patch 0   No current facility-administered medications for this encounter.   Allergies: No Known Allergies   Review of systems complete and found to be negative unless listed in HPI.    BP 98/68   Pulse 100   Wt 86.2 kg (190 lb)   SpO2 97%   BMI 28.06 kg/m   Wt Readings from Last 3 Encounters:  01/14/24 86.2 kg (190 lb)  01/03/24 87 kg (191 lb 11.2 oz)  12/24/23 90.5 kg (199 lb 9.6 oz)    PHYSICAL EXAM: General:  Sitting in chairNo resp difficulty HEENT: normal Neck: supple. no JVD. Carotids 2+ bilat; no bruits. No lymphadenopathy or thryomegaly appreciated. UJW:[id number] rate & rhythm. No rubs, gallops or murmurs. Lungs: clear Abdomen: soft, nontender, nondistended. No hepatosplenomegaly. No bruits or masses. Good bowel sounds. Extremities: no cyanosis, clubbing, rash, edema Neuro: alert & orientedx3, cranial nerves grossly intact. moves all 4 extremities w/o difficulty. Affect pleasant   Device interrogation (personally reviewed): No VT/AF. Volume ok Personally reviewed   ASSESSMENT AND PLAN:   1. Acute on Chronic Systolic Heart Failure:  - NICM - Echo (09/2017): EF 20-25%.  - Echo (04/20/21): EF 20-25% mild MR. RV ok  - S/P St Jude ICD.  - Echo 09/28/23, EF 20-25%, RV ok - Scheduled for CPX in 10/24 but says he didn't show because he felt to poorly to walk onTM - Had R/L cath 01/03/24. Normal coronaries LV = 86/26 RA  10 PA 40/28 (34) PCW 24 Thermo 3.8/1.9 Fick 3.2/1.6 PAPi 1.2 - NYHA IIIB - Volume ok - Continue Jardiance 10 mg daily.  - Continue Lasix 20 mg daily + 20 KCL daily. - Off Entresto 24/26 mg bid and eplernone 12.5 due to low BP (stopped at [facility]) - Continue Toprol XL 25 mg daily. - BP too low tto add back GDMT - Recent cath confirm biventricular dysfunction with low output. Unfortunately not transplant candidate with ongoing tobacco use and non-compliance. We did talk  about potential VAD candidacy but PAPI is low and he recently signed out of hospital AMA post-cath so I am concerned his compliance will not support VAD implantation - Continue to manage medically for now   2. NSVT/VT/VF - Frequent NSVT 8/22 - Had VT/VF with shock 09/18/23. Seen by EP. Amio increased to 200 bid for several weeks/ Back to 200 daily. No driving x 6 months  - Amio recently stopped by [facility]. Resume amiodarone 200 mg daily. - Continue Toprol for now. (May need to stop with low output) -No VT on ICD today   3. HIV  - He follows with ID.    - continue current regimen  4. Tobacco use disorder   - Still smoking 1/2 ppd  - I again discussed need for cessation to permit advanced HF theparies  5. Cocaine abuse - Reports no further use since 02/2022. - He is adamant about this  6. Noncompliance - this has been a major hurdle in his care - recently signed out AMA post-cath. We discussed need for better compliance to permit advanced therapies,.   I spent a total of 42 minutes today: 1) reviewing the patient's medical records including previous charts, labs and recent notes from other providers; 2) examining the patient and counseling them on their medical issues/explaining the plan of care; 3) adjusting meds as needed and 4) ordering lab work or other needed tests.    Arvilla Meres, MD  2:53 PM

## 2024-01-15 ENCOUNTER — Ambulatory Visit: Payer: 59 | Admitting: Cardiology

## 2024-01-15 ENCOUNTER — Encounter: Payer: 59 | Admitting: Cardiology

## 2024-01-16 ENCOUNTER — Telehealth (HOSPITAL_COMMUNITY): Payer: Self-pay

## 2024-01-16 ENCOUNTER — Emergency Department (HOSPITAL_COMMUNITY)
Admission: EM | Admit: 2024-01-16 | Discharge: 2024-01-16 | Discharge status: Against Medical Advice | Payer: 59 | Attending: Emergency Medicine | Admitting: Emergency Medicine

## 2024-01-16 DIAGNOSIS — Y69 Unspecified misadventure during surgical and medical care: Secondary | ICD-10-CM | POA: Diagnosis not present

## 2024-01-16 DIAGNOSIS — T82198A Other mechanical complication of other cardiac electronic device, initial encounter: Secondary | ICD-10-CM | POA: Insufficient documentation

## 2024-01-16 DIAGNOSIS — Z5321 Procedure and treatment not carried out due to patient leaving prior to being seen by health care provider: Secondary | ICD-10-CM | POA: Insufficient documentation

## 2024-01-16 DIAGNOSIS — I491 Atrial premature depolarization: Secondary | ICD-10-CM | POA: Diagnosis not present

## 2024-01-16 DIAGNOSIS — R202 Paresthesia of skin: Secondary | ICD-10-CM | POA: Diagnosis not present

## 2024-01-16 DIAGNOSIS — I499 Cardiac arrhythmia, unspecified: Secondary | ICD-10-CM | POA: Diagnosis not present

## 2024-01-16 DIAGNOSIS — Z743 Need for continuous supervision: Secondary | ICD-10-CM | POA: Diagnosis not present

## 2024-01-16 DIAGNOSIS — R509 Fever, unspecified: Secondary | ICD-10-CM | POA: Diagnosis not present

## 2024-01-16 NOTE — ED Notes (Signed)
Patient stated, "I am not staying in this fucking room with no Tv. I am leaving" RN witnessed patient leaving department

## 2024-01-16 NOTE — Telephone Encounter (Signed)
Patient requesting to be seen,c/o sob and chest pain. Pt sitting in reception in no apparent distress.Patient had left ER and had not been seen. Patient states I'm not waiting to be seen 14 hours.Told patient we would instruct him to go to the ER. Patient has poor eye contact and not listening to what is being said. Patient calling someone to drive him home

## 2024-01-16 NOTE — ED Triage Notes (Signed)
Coming from home. Defibrillator went off around 0200. Patient did have left arm and chest pain prior too. Patient declines pain now. Patient recently started on amio

## 2024-01-18 ENCOUNTER — Encounter: Payer: Self-pay | Admitting: Internal Medicine

## 2024-01-18 ENCOUNTER — Other Ambulatory Visit: Payer: 59

## 2024-01-21 ENCOUNTER — Inpatient Hospital Stay (HOSPITAL_COMMUNITY): Admission: RE | Admit: 2024-01-21 | Payer: 59 | Source: Ambulatory Visit

## 2024-01-21 ENCOUNTER — Encounter (HOSPITAL_COMMUNITY): Payer: Self-pay

## 2024-01-23 ENCOUNTER — Encounter (HOSPITAL_COMMUNITY): Payer: Self-pay | Admitting: Emergency Medicine

## 2024-01-23 ENCOUNTER — Telehealth: Payer: Self-pay | Admitting: Cardiology

## 2024-01-23 ENCOUNTER — Emergency Department (HOSPITAL_COMMUNITY): Payer: 59

## 2024-01-23 ENCOUNTER — Other Ambulatory Visit: Payer: Self-pay

## 2024-01-23 ENCOUNTER — Encounter: Payer: Self-pay | Admitting: Cardiology

## 2024-01-23 ENCOUNTER — Encounter (HOSPITAL_COMMUNITY): Payer: 59 | Admitting: Internal Medicine

## 2024-01-23 ENCOUNTER — Inpatient Hospital Stay (HOSPITAL_COMMUNITY)
Admission: EM | Admit: 2024-01-23 | Discharge: 2024-02-08 | DRG: 308 | Disposition: A | Discharge status: Hospice | Payer: 59 | Attending: Internal Medicine | Admitting: Internal Medicine

## 2024-01-23 ENCOUNTER — Encounter (HOSPITAL_COMMUNITY): Payer: Self-pay

## 2024-01-23 DIAGNOSIS — R630 Anorexia: Secondary | ICD-10-CM | POA: Diagnosis not present

## 2024-01-23 DIAGNOSIS — R309 Painful micturition, unspecified: Secondary | ICD-10-CM | POA: Diagnosis not present

## 2024-01-23 DIAGNOSIS — T462X6A Underdosing of other antidysrhythmic drugs, initial encounter: Secondary | ICD-10-CM | POA: Diagnosis present

## 2024-01-23 DIAGNOSIS — N182 Chronic kidney disease, stage 2 (mild): Secondary | ICD-10-CM | POA: Diagnosis present

## 2024-01-23 DIAGNOSIS — Z95810 Presence of automatic (implantable) cardiac defibrillator: Secondary | ICD-10-CM

## 2024-01-23 DIAGNOSIS — I5023 Acute on chronic systolic (congestive) heart failure: Principal | ICD-10-CM | POA: Diagnosis not present

## 2024-01-23 DIAGNOSIS — D751 Secondary polycythemia: Secondary | ICD-10-CM | POA: Diagnosis present

## 2024-01-23 DIAGNOSIS — I428 Other cardiomyopathies: Secondary | ICD-10-CM | POA: Diagnosis present

## 2024-01-23 DIAGNOSIS — F17210 Nicotine dependence, cigarettes, uncomplicated: Secondary | ICD-10-CM | POA: Diagnosis present

## 2024-01-23 DIAGNOSIS — Z7189 Other specified counseling: Secondary | ICD-10-CM | POA: Diagnosis not present

## 2024-01-23 DIAGNOSIS — R4589 Other symptoms and signs involving emotional state: Secondary | ICD-10-CM | POA: Diagnosis not present

## 2024-01-23 DIAGNOSIS — Z6827 Body mass index (BMI) 27.0-27.9, adult: Secondary | ICD-10-CM

## 2024-01-23 DIAGNOSIS — I4901 Ventricular fibrillation: Secondary | ICD-10-CM | POA: Diagnosis present

## 2024-01-23 DIAGNOSIS — Z7984 Long term (current) use of oral hypoglycemic drugs: Secondary | ICD-10-CM | POA: Diagnosis not present

## 2024-01-23 DIAGNOSIS — F32A Depression, unspecified: Secondary | ICD-10-CM | POA: Diagnosis present

## 2024-01-23 DIAGNOSIS — K5900 Constipation, unspecified: Secondary | ICD-10-CM | POA: Diagnosis not present

## 2024-01-23 DIAGNOSIS — I509 Heart failure, unspecified: Secondary | ICD-10-CM | POA: Diagnosis not present

## 2024-01-23 DIAGNOSIS — Z66 Do not resuscitate: Secondary | ICD-10-CM | POA: Diagnosis not present

## 2024-01-23 DIAGNOSIS — I472 Ventricular tachycardia, unspecified: Secondary | ICD-10-CM | POA: Diagnosis present

## 2024-01-23 DIAGNOSIS — Z821 Family history of blindness and visual loss: Secondary | ICD-10-CM

## 2024-01-23 DIAGNOSIS — I130 Hypertensive heart and chronic kidney disease with heart failure and stage 1 through stage 4 chronic kidney disease, or unspecified chronic kidney disease: Secondary | ICD-10-CM | POA: Diagnosis present

## 2024-01-23 DIAGNOSIS — B20 Human immunodeficiency virus [HIV] disease: Secondary | ICD-10-CM | POA: Diagnosis present

## 2024-01-23 DIAGNOSIS — F419 Anxiety disorder, unspecified: Secondary | ICD-10-CM | POA: Diagnosis present

## 2024-01-23 DIAGNOSIS — J4489 Other specified chronic obstructive pulmonary disease: Secondary | ICD-10-CM | POA: Diagnosis present

## 2024-01-23 DIAGNOSIS — I429 Cardiomyopathy, unspecified: Secondary | ICD-10-CM | POA: Diagnosis not present

## 2024-01-23 DIAGNOSIS — R1030 Lower abdominal pain, unspecified: Secondary | ICD-10-CM | POA: Diagnosis not present

## 2024-01-23 DIAGNOSIS — Z818 Family history of other mental and behavioral disorders: Secondary | ICD-10-CM

## 2024-01-23 DIAGNOSIS — Z83511 Family history of glaucoma: Secondary | ICD-10-CM

## 2024-01-23 DIAGNOSIS — Z8249 Family history of ischemic heart disease and other diseases of the circulatory system: Secondary | ICD-10-CM

## 2024-01-23 DIAGNOSIS — I5084 End stage heart failure: Secondary | ICD-10-CM | POA: Diagnosis present

## 2024-01-23 DIAGNOSIS — Z91128 Patient's intentional underdosing of medication regimen for other reason: Secondary | ICD-10-CM

## 2024-01-23 DIAGNOSIS — R627 Adult failure to thrive: Secondary | ICD-10-CM | POA: Diagnosis not present

## 2024-01-23 DIAGNOSIS — E876 Hypokalemia: Secondary | ICD-10-CM | POA: Diagnosis present

## 2024-01-23 DIAGNOSIS — Z515 Encounter for palliative care: Secondary | ICD-10-CM

## 2024-01-23 DIAGNOSIS — Z7982 Long term (current) use of aspirin: Secondary | ICD-10-CM

## 2024-01-23 DIAGNOSIS — Z7951 Long term (current) use of inhaled steroids: Secondary | ICD-10-CM

## 2024-01-23 DIAGNOSIS — Z79899 Other long term (current) drug therapy: Secondary | ICD-10-CM

## 2024-01-23 LAB — BASIC METABOLIC PANEL
Anion gap: 12 (ref 5–15)
BUN: 11 mg/dL (ref 8–23)
CO2: 24 mmol/L (ref 22–32)
Calcium: 8.5 mg/dL — ABNORMAL LOW (ref 8.9–10.3)
Chloride: 106 mmol/L (ref 98–111)
Creatinine, Ser: 1.3 mg/dL — ABNORMAL HIGH (ref 0.61–1.24)
GFR, Estimated: >60 mL/min (ref >60)
Glucose, Bld: 73 mg/dL (ref 70–99)
Potassium: 3.9 mmol/L (ref 3.5–5.1)
Sodium: 142 mmol/L (ref 135–145)

## 2024-01-23 LAB — MAGNESIUM: Magnesium: 2.1 mg/dL (ref 1.7–2.4)

## 2024-01-23 MED ORDER — AMIODARONE HCL IN DEXTROSE 360-4.14 MG/200ML-% IV SOLN
60.0000 mg/h | INTRAVENOUS | Status: DC
  Administered 2024-01-23: 60 mg/h via INTRAVENOUS
  Filled 2024-01-23: qty 200

## 2024-01-23 MED ORDER — AMIODARONE HCL IN DEXTROSE 360-4.14 MG/200ML-% IV SOLN
30.0000 mg/h | INTRAVENOUS | Status: DC
  Administered 2024-01-24 (×2): 30 mg/h via INTRAVENOUS
  Administered 2024-01-25 – 2024-01-26 (×3): 60 mg/h via INTRAVENOUS
  Administered 2024-01-26: 30 mg/h via INTRAVENOUS
  Administered 2024-01-26: 60 mg/h via INTRAVENOUS
  Administered 2024-01-27: 30 mg/h via INTRAVENOUS
  Filled 2024-01-23 (×11): qty 200

## 2024-01-23 MED ORDER — AMIODARONE LOAD VIA INFUSION
150.0000 mg | Freq: Once | INTRAVENOUS | Status: AC
  Administered 2024-01-23: 150 mg via INTRAVENOUS
  Filled 2024-01-23: qty 83.34

## 2024-01-23 NOTE — H&P (Signed)
 HPI Ryan Long presents today for evaluation of worsening VT. He is a pleasant 63 year old man with a history of hypertension, depression, chronic systolic heart failure, HIV disease, and a history of polysubstance abuse.  The patient has had a history of ventricular tachycardia for several months.  He has been treated with amiodarone .  He was in his usual state of health until earlier today when he began to feel badly.  Alerts from our office demonstrated ventricular tachycardia for which he received multiple therapies.  The patient denies any shocks.  The patient is not felt to be a candidate for destination therapy.  He has a history of ongoing tobacco use and noncompliance.  The patient has had amiodarone  off-and-on in the past.  Most recently, he was placed on amiodarone  but stopped taking it when he had an episode of syncope secondary to VT. No Known Allergies   No current facility-administered medications for this encounter.   Current Outpatient Medications  Medication Sig Dispense Refill   albuterol  (VENTOLIN  HFA) 108 (90 Base) MCG/ACT inhaler Inhale 2 puffs into the lungs every 6 (six) hours as needed for wheezing or shortness of breath. 6.7 g 3   amiodarone  (PACERONE ) 200 MG tablet Take 1 tablet (200 mg total) by mouth daily.     aspirin  EC 81 MG tablet Take 81 mg by mouth daily. Swallow whole.     bictegravir-emtricitabine -tenofovir  AF (BIKTARVY ) 50-200-25 MG TABS tablet Take 1 tablet by mouth daily. 30 tablet 11   empagliflozin  (JARDIANCE ) 10 MG TABS tablet Take 1 tablet (10 mg total) by mouth daily before breakfast. 30 tablet 11   finasteride  (PROSCAR ) 5 MG tablet TAKE 1 TABLET(5 MG) BY MOUTH DAILY 30 tablet 0   fluticasone  furoate-vilanterol (BREO ELLIPTA ) 200-25 MCG/ACT AEPB Inhale 1 puff into the lungs daily.     furosemide  (LASIX ) 20 MG tablet Take 40 mg by mouth daily.     Melatonin 10 MG TABS Take 10 mg by mouth at bedtime.     metoprolol  succinate (TOPROL -XL) 50 MG 24  hr tablet Take 25 mg by mouth daily. Take with or immediately following a meal.     Multiple Vitamins-Minerals (MENS MULTIPLUS PO) Take 1 tablet by mouth daily.     nicotine  (NICODERM CQ  - DOSED IN MG/24 HOURS) 14 mg/24hr patch Place 1 patch (14 mg total) onto the skin daily. (Patient not taking: Reported on 01/14/2024) 14 patch 0   nicotine  (NICODERM CQ  - DOSED IN MG/24 HOURS) 21 mg/24hr patch Place 1 patch (21 mg total) onto the skin daily. (Patient not taking: Reported on 01/14/2024) 28 patch 3   nicotine  (NICODERM CQ  - DOSED IN MG/24 HR) 7 mg/24hr patch Place 1 patch (7 mg total) onto the skin daily. (Patient not taking: Reported on 01/14/2024) 7 patch 0   potassium chloride  SA (KLOR-CON  M) 20 MEQ tablet Take 2 tablets (40 mEq total) by mouth daily. 60 tablet 3   sildenafil  (VIAGRA ) 100 MG tablet Take 100 mg by mouth daily as needed for erectile dysfunction.     tamsulosin  (FLOMAX ) 0.4 MG CAPS capsule TAKE 2 CAPSULES(0.8 MG) BY MOUTH DAILY AFTER SUPPER (Patient taking differently: Take 0.4 mg by mouth 2 (two) times daily.) 60 capsule 5   triamcinolone  ointment (KENALOG ) 0.5 % Apply 1 Application topically 2 (two) times daily. 30 g 3   valACYclovir  (VALTREX ) 500 MG tablet Take 1 tablet (500 mg total) by mouth daily. 60 tablet 4     Past Medical History:  Diagnosis Date   Active smoker    AICD (automatic cardioverter/defibrillator) present    Alcohol  abuse    Allergy July 2023   Anxiety    AR (allergic rhinitis)    Bipolar 1 disorder (HCC)    Cervical lymphadenitis 04/20/2021   CHF (congestive heart failure) (HCC)    Chronic bronchitis (HCC)    Chronic systolic heart failure (HCC)    Controlled substance agreement signed 10/22/2018   COPD (chronic obstructive pulmonary disease) (HCC)    Cough 12/31/2018   Crack cocaine use    Depression    Genital herpes    HIV (human immunodeficiency virus infection) (HCC) dx'd 08/1993   HLD (hyperlipidemia)    Hypertension    NICM (nonischemic  cardiomyopathy) (HCC)    Echocardiogram 06/28/11: EF 30-35%, mild MR, mild LAE;  No CAD by coronary CT angiogram 3/12 at [facility]   NSVT (nonsustained ventricular tachycardia) (HCC)    PTSD (post-traumatic stress disorder)     ROS:   All systems reviewed and negative except as noted in the HPI.   Past Surgical History:  Procedure Laterality Date   CARDIAC DEFIBRILLATOR PLACEMENT  01/08/2018   ICD IMPLANT N/A 01/08/2018   Procedure: ICD IMPLANT;  Surgeon: Inocencio Soyla Lunger, MD;  Location: [facility] INVASIVE CV LAB;  Service: Cardiovascular;  Laterality: N/A;   RIGHT/LEFT HEART CATH AND CORONARY ANGIOGRAPHY N/A 01/03/2024   Procedure: RIGHT/LEFT HEART CATH AND CORONARY ANGIOGRAPHY;  Surgeon: Cherrie Toribio SAUNDERS, MD;  Location: [facility] INVASIVE CV LAB;  Service: Cardiovascular;  Laterality: N/A;     Family History  Problem Relation Age of Onset   Glaucoma Mother    Mental illness Mother    Vision loss Mother    Hypertension Father    CAD Father    Mental illness Father    Alcohol  abuse Father    Drug abuse Father    Heart attack Father 3   Early death Father    Heart disease Father    Alcohol  abuse Brother    Drug abuse Brother    Drug abuse Brother      Social History   Socioeconomic History   Marital status: Single    Spouse name: Not on file   Number of children: Not on file   Years of education: Not on file   Highest education level: GED or equivalent  Occupational History   Not on file  Tobacco Use   Smoking status: Every Day    Current packs/day: 0.75    Average packs/day: 0.8 packs/day for 42.0 years (31.5 ttl pk-yrs)    Types: Cigarettes   Smokeless tobacco: Never   Tobacco comments:    hoping welbutrin will help  Vaping Use   Vaping status: Never Used  Substance and Sexual Activity   Alcohol  use: Not Currently    Alcohol /week: 6.0 standard drinks of alcohol     Comment: 01/08/2018 stopped 06/2017   Drug use: Not Currently    Types:  Crack cocaine    Comment: 01/08/2018 stopped 06/2017   Sexual activity: Yes    Birth control/protection: Condom    Comment: pt. declined condoms  Other Topics Concern   Not on file  Social History Narrative   Not on file   Social Drivers of Health   Financial Resource Strain: Low Risk  (07/17/2023)   Overall Financial Resource Strain (CARDIA)    Difficulty of Paying Living Expenses: Not hard at all  Food Insecurity: Low Risk  (10/26/2023)   Received from  [facility]   Hunger Vital Sign    Worried About Running Out of Food in the Last Year: Never true    Ran Out of Food in the Last Year: Never true  Transportation Needs: No Transportation Needs (10/26/2023)   Received from [facility]    In the past 12 months, has lack of reliable transportation kept you from medical appointments, meetings, work or from getting things needed for daily living? : No  Physical Activity: Insufficiently Active (07/17/2023)   Exercise Vital Sign    Days of Exercise per Week: 3 days    Minutes of Exercise per Session: 20 min  Stress: Stress Concern Present (07/17/2023)   [employer] of Occupational Health - Occupational Stress Questionnaire    Feeling of Stress : To some extent  Social Connections: Moderately Integrated (07/17/2023)   Social Connection and Isolation Panel [NHANES]    Frequency of Communication with Friends and Family: Twice a week    Frequency of Social Gatherings with Friends and Family: More than three times a week    Attends Religious Services: 1 to 4 times per year    Active Member of [employer] or Organizations: Yes    Attends Engineer, Structural: More than 4 times per year    Marital Status: Never married  Intimate Partner Violence: Not At Risk (07/18/2023)   Humiliation, Afraid, Rape, and Kick questionnaire    Fear of Current or Ex-Partner: No    Emotionally Abused: No    Physically Abused: No    Sexually Abused: No     BP 111/87 (BP Location:  Right Arm)   Pulse (!) 104   Temp 98.2 F (36.8 C) (Oral)   Resp 20   Ht 5' 9 (1.753 m)   Wt 84.8 kg   SpO2 99%   BMI 27.62 kg/m   Physical Exam:  Well appearing middle-age man, NAD HEENT: Unremarkable Neck: 7 cm JVD, no thyromegally Lymphatics:  No adenopathy Back:  No CVA tenderness Lungs:  Clear with no wheezes or rhonchi.  No increased work of breathing. HEART:  Regular rate rhythm, no murmurs, no rubs, no clicks Abd:  soft, positive bowel sounds, no organomegally, no rebound, no guarding Ext:  2 plus pulses, no edema, no cyanosis, no clubbing Skin:  No rashes no nodules Neuro:  CN II through XII intact, motor grossly intact  EKG -normal sinus rhythm with PVCs   Assess/Plan: 1.  VT storm -the patient is currently in sinus rhythm.  He will be started on intravenous amiodarone  and reloaded.  No evidence of worsening heart failure or any evidence of ischemia.  He has known nonischemic cardiomyopathy. 2.  Chronic systolic heart failure -he is not obviously volume overloaded.  He will continue his current heart failure medical therapy.  We will alert Dr. Cherrie and colleagues of his admission.  No plans for invasive evaluation at this time.  It might make sense to place a PICC line to confirm his co ox is not particularly low. 3.  ICD in place -his [employer]onic single-chamber ICD will be interrogated and reprogrammed as appropriate.   Danelle Laurisa Sahakian,MD

## 2024-01-23 NOTE — ED Triage Notes (Signed)
 Pt BIB [facility] from home due defibrillator firing off two nights in a row.  [employer] called doctor's office and told patient to come in immediately due to report called into doctor's office.  Pt does report palpitations with sharp pain in the mid chest.  Pt has been sinus tach with PVC's on EKG lead.  Pt did have a run of v-tach while awaiting room in ED; strip at bedside.  Pt given 324 aspirin  and 300ml NS.  VS BP 109/68, HR 102, SpO2 99%, Resp 16.  20g left AC.

## 2024-01-23 NOTE — Telephone Encounter (Signed)
  1. Has your device fired? no  2. Is you device beeping? no  3. Are you experiencing draining or swelling at device site? no  4. Are you calling to see if we received your device transmission? No   5. Have you passed out? No  Pt said he is going to go somewhere else and cx all his appts   Please route to Device Clinic Pool

## 2024-01-23 NOTE — Progress Notes (Addendum)
 Ryan Long

## 2024-01-23 NOTE — ED Provider Notes (Signed)
 [place] [facility]ENC[facility] DEPARTMENT AT [facility] Provider Note   CSN: [id number] Arrival date & time: 01/23/24  1705     History  Chief Complaint  Patient presents with   Palpitations    Ryan Long is a 63 y.o. male.   Palpitations Patient presents with lightheadedness and AICD problems.  Has had AICD fire.  Feeling lightheaded.  AICD had been interrogated as an outpatient and found to have both V. tach and V-fib and sent in for further treatment.  Reportedly had been on amiodarone  but did not tolerate.  Did have episode of V. tach for EMS.    Past Medical History:  Diagnosis Date   Active smoker    AICD (automatic cardioverter/defibrillator) present    Alcohol  abuse    Allergy July 2023   Anxiety    AR (allergic rhinitis)    Bipolar 1 disorder (HCC)    Cervical lymphadenitis 04/20/2021   CHF (congestive heart failure) (HCC)    Chronic bronchitis (HCC)    Chronic systolic heart failure (HCC)    Controlled substance agreement signed 10/22/2018   COPD (chronic obstructive pulmonary disease) (HCC)    Cough 12/31/2018   Crack cocaine use    Depression    Genital herpes    HIV (human immunodeficiency virus infection) (HCC) dx'd 08/1993   HLD (hyperlipidemia)    Hypertension    NICM (nonischemic cardiomyopathy) (HCC)    Echocardiogram 06/28/11: EF 30-35%, mild MR, mild LAE;  No CAD by coronary CT angiogram 3/12 at [facility]   NSVT (nonsustained ventricular tachycardia) (HCC)    PTSD (post-traumatic stress disorder)     Home Medications Prior to Admission medications   Medication Sig Start Date End Date Taking? Authorizing Provider  albuterol  (VENTOLIN  HFA) 108 (90 Base) MCG/ACT inhaler Inhale 2 puffs into the lungs every 6 (six) hours as needed for wheezing or shortness of breath. 07/30/23  Yes Brien Belvie BRAVO, MD  aspirin  EC 81 MG tablet Take 81 mg by mouth in the morning. Swallow whole.   Yes [provider]   bictegravir-emtricitabine -tenofovir  AF (BIKTARVY ) 50-200-25 MG TABS tablet Take 1 tablet by mouth daily. Patient taking differently: Take 1 tablet by mouth in the morning. 08/15/23  Yes Fleeta Rothman, Jomarie SAILOR, MD  empagliflozin  (JARDIANCE ) 10 MG TABS tablet Take 1 tablet (10 mg total) by mouth daily before breakfast. 12/01/22  Yes Milford, Harlene HERO, FNP  finasteride  (PROSCAR ) 5 MG tablet TAKE 1 TABLET(5 MG) BY MOUTH DAILY Patient taking differently: Take 5 mg by mouth in the morning. 03/15/23  Yes Brien Belvie BRAVO, MD  fluticasone  furoate-vilanterol (BREO ELLIPTA ) 200-25 MCG/ACT AEPB Inhale 1 puff into the lungs daily.   Yes [provider]  furosemide  (LASIX ) 20 MG tablet Take 40 mg by mouth in the morning.   Yes [provider]  IBUPROFEN  PO Take 400 mg by mouth every 6 (six) hours as needed for mild pain (pain score 1-3), moderate pain (pain score 4-6) or fever. Liquid gel   Yes [provider]  Melatonin 10 MG TABS Take 10 mg by mouth at bedtime.   Yes [provider]  metoprolol  succinate (TOPROL -XL) 25 MG 24 hr tablet Take 25 mg by mouth in the morning. Take with or immediately following a meal.   Yes [provider]  Multiple Vitamins-Minerals (MENS MULTIPLUS PO) Take 1 tablet by mouth daily.   Yes [provider]  tamsulosin  (FLOMAX ) 0.4 MG CAPS capsule TAKE 2 CAPSULES(0.8 MG) BY  MOUTH DAILY AFTER SUPPER Patient taking differently: Take 0.4 mg by mouth 2 (two) times daily. 07/26/23  Yes Brien Belvie BRAVO, MD  triamcinolone  ointment (KENALOG ) 0.5 % Apply 1 Application topically 2 (two) times daily. Patient taking differently: Apply 1 Application topically daily. 08/15/23  Yes Brien Belvie BRAVO, MD  valACYclovir  (VALTREX ) 500 MG tablet Take 1 tablet (500 mg total) by mouth daily. Patient taking differently: Take 500 mg by mouth in the morning. 11/15/22  Yes Brien Belvie BRAVO, MD  amiodarone  (PACERONE ) 200 MG tablet Take 1 tablet (200 mg total)  by mouth daily. Patient not taking: Reported on 01/23/2024 01/14/24   Bensimhon, Toribio SAUNDERS, MD  ENTRESTO  24-26 MG Take 1 tablet by mouth 2 (two) times daily. Patient not taking: Reported on 01/23/2024 12/29/23   [provider]  nicotine  (NICODERM CQ  - DOSED IN MG/24 HOURS) 21 mg/24hr patch Place 1 patch (21 mg total) onto the skin daily. Patient not taking: Reported on 01/14/2024 09/12/23   Fleeta Rothman, Jomarie SAILOR, MD  potassium chloride  SA (KLOR-CON  M) 20 MEQ tablet Take 2 tablets (40 mEq total) by mouth daily. 12/18/23   Milford, Harlene HERO, FNP  sildenafil  (VIAGRA ) 100 MG tablet Take 100 mg by mouth daily as needed for erectile dysfunction. Patient not taking: Reported on 01/23/2024    [provider]      Allergies    Patient has no known allergies.    Review of Systems   Review of Systems  Cardiovascular:  Positive for palpitations.    Physical Exam Updated Vital Signs BP (!) 133/103   Pulse (!) 101   Temp 98.2 F (36.8 C) (Oral)   Resp (!) 24   Ht 5' 9 (1.753 m)   Wt 84.8 kg   SpO2 97%   BMI 27.62 kg/m  Physical Exam Vitals and nursing note reviewed.  HENT:     Head: Normocephalic.  Cardiovascular:     Rate and Rhythm: Regular rhythm. Tachycardia present.  Abdominal:     Tenderness: There is no abdominal tenderness.  Musculoskeletal:     Right lower leg: Edema present.     Left lower leg: Edema present.  Skin:    General: Skin is warm.  Neurological:     Mental Status: He is alert and oriented to person, place, and time.     ED Results / Procedures / Treatments   Labs (all labs ordered are listed, but only abnormal results are displayed) Labs Reviewed  BASIC METABOLIC PANEL - Abnormal; Notable for the following components:      Result Value   Creatinine, Ser 1.30 (*)    Calcium  8.5 (*)    All other components within normal limits  MAGNESIUM   CBC    EKG None  Radiology DG Chest Portable 1 View Result Date: 01/23/2024 CLINICAL DATA:   Shortness of breath.  Defibrillator firing. EXAM: PORTABLE CHEST 1 VIEW COMPARISON:  12/04/2023 FINDINGS: Cardiac pacemaker with lead tip over the RA region, unchanged. Cardiac enlargement. Lungs are clear. No pleural effusion or pneumothorax. Mediastinal contours appear intact. IMPRESSION: Cardiac enlargement.  No evidence of active pulmonary disease. Electronically Signed   By: Elsie Gravely M.D.   On: 01/23/2024 18:26    Procedures Procedures    Medications Ordered in ED Medications  amiodarone  (NEXTERONE ) 1.8 mg/mL load via infusion 150 mg (150 mg Intravenous Bolus from Bag 01/23/24 1925)    Followed by  amiodarone  (NEXTERONE  PREMIX) 360-4.14 MG/200ML-% (1.8 mg/mL) IV infusion (60 mg/hr Intravenous New Bag/Given  01/23/24 2000)    Followed by  amiodarone  (NEXTERONE  PREMIX) 360-4.14 MG/200ML-% (1.8 mg/mL) IV infusion (has no administration in time range)    ED Course/ Medical Decision Making/ A&P                                 Medical Decision Making Amount and/or Complexity of Data Reviewed Labs: ordered. Radiology: ordered.  Risk Decision regarding hospitalization.   Patient told he can and by cardiology.  History of V-fib and V. tach over the last week.  Previously came to ER but would not stay since his room did not have a TV in it.  Appears to have multiple episodes of nonsustained V. tach.  Has had episodes also of likely V. tach while in the ER.  Discussed with cardiology, who will come and see patient.  Will get basic blood work and x-ray  Blood work reassuring.  Admitted cardiology.  Started on amiodarone  drip.        Final Clinical Impression(s) / ED Diagnoses Final diagnoses:  Ventricular tachycardia [facility])    Rx / DC Orders ED Discharge Orders     None         Patsey Lot, MD 01/23/24 2259

## 2024-01-23 NOTE — Telephone Encounter (Signed)
 Outreach made to Pt.  Introduced myself to Pt, advised this nurse worked with Dr. Inocencio at [facility] and we took care of his defibrillator.  Asked if he had meant to call this office, he had an appointment with Heart Failure clinic today.  Pt started telling this nurse that he had been feeling very poorly lately.  He has been dizzy.  He just does not know what to do.  He feels like he may die.  States that when he lies down he feels a little better.    Requested Pt send this nurse a transmission.  He was able to do so, although he became dizzy while sending the transmission and he had to lie down.  This nurse confirmed his address in case phone call were to become disconnected and needed to call 911.  Transmission received successfully.    See below for list of episodes and full report of VT with therapy.  Per Pt he cannot take amiodarone .   It makes him feel bad, dizzy.  He states the doctor's want him to take it but he can't.    Discussed and reviewed transmission with Dr. Fernande.  Dr. Fernande called Pt and advised Pt to call 911.  Pt was agreeable.  Advised Pt would let cardiac services know he was coming.

## 2024-01-24 ENCOUNTER — Encounter (HOSPITAL_COMMUNITY): Payer: Self-pay | Admitting: Internal Medicine

## 2024-01-24 ENCOUNTER — Other Ambulatory Visit: Payer: 59

## 2024-01-24 DIAGNOSIS — I472 Ventricular tachycardia, unspecified: Secondary | ICD-10-CM | POA: Diagnosis not present

## 2024-01-24 LAB — BASIC METABOLIC PANEL
Anion gap: 14 (ref 5–15)
Anion gap: 13 (ref 5–15)
BUN: 14 mg/dL (ref 8–23)
BUN: 13 mg/dL (ref 8–23)
CO2: 18 mmol/L — ABNORMAL LOW (ref 22–32)
CO2: 23 mmol/L (ref 22–32)
Calcium: 8.4 mg/dL — ABNORMAL LOW (ref 8.9–10.3)
Calcium: 8.2 mg/dL — ABNORMAL LOW (ref 8.9–10.3)
Chloride: 107 mmol/L (ref 98–111)
Chloride: 105 mmol/L (ref 98–111)
Creatinine, Ser: 1.41 mg/dL — ABNORMAL HIGH (ref 0.61–1.24)
Creatinine, Ser: 1.49 mg/dL — ABNORMAL HIGH (ref 0.61–1.24)
GFR, Estimated: 56 mL/min — ABNORMAL LOW (ref >60)
GFR, Estimated: 53 mL/min — ABNORMAL LOW (ref >60)
Glucose, Bld: 115 mg/dL — ABNORMAL HIGH (ref 70–99)
Glucose, Bld: 97 mg/dL (ref 70–99)
Potassium: 3.3 mmol/L — ABNORMAL LOW (ref 3.5–5.1)
Potassium: 3.4 mmol/L — ABNORMAL LOW (ref 3.5–5.1)
Sodium: 139 mmol/L (ref 135–145)
Sodium: 141 mmol/L (ref 135–145)

## 2024-01-24 LAB — RESPIRATORY PANEL BY PCR

## 2024-01-24 LAB — CBC
HCT: 50.9 % (ref 39.0–52.0)
Hemoglobin: 18.2 g/dL — ABNORMAL HIGH (ref 13.0–17.0)
MCH: 33.6 pg (ref 26.0–34.0)
MCHC: 35.8 g/dL (ref 30.0–36.0)
MCV: 94.1 fL (ref 80.0–100.0)
Platelets: 197 K/uL (ref 150–400)
RBC: 5.41 MIL/uL (ref 4.22–5.81)
RDW: 13.3 % (ref 11.5–15.5)
WBC: 3.3 K/uL — ABNORMAL LOW (ref 4.0–10.5)
nRBC: 0 % (ref 0.0–0.2)

## 2024-01-24 LAB — MAGNESIUM: Magnesium: 1.9 mg/dL (ref 1.7–2.4)

## 2024-01-24 MED ORDER — ALBUTEROL SULFATE HFA 108 (90 BASE) MCG/ACT IN AERS: 2.0000 | INHALATION_SPRAY | Freq: Four times a day (QID) | RESPIRATORY_TRACT | Status: DC | PRN

## 2024-01-24 MED ORDER — FUROSEMIDE 10 MG/ML IJ SOLN
80.0000 mg | Freq: Once | INTRAMUSCULAR | Status: AC
  Administered 2024-01-24: 80 mg via INTRAVENOUS
  Filled 2024-01-24: qty 8

## 2024-01-24 MED ORDER — SODIUM CHLORIDE 0.9 % IV BOLUS
250.0000 mL | Freq: Once | INTRAVENOUS | Status: AC
  Administered 2024-01-24: 250 mL via INTRAVENOUS

## 2024-01-24 MED ORDER — ASPIRIN 81 MG PO TBEC
81.0000 mg | DELAYED_RELEASE_TABLET | Freq: Every day | ORAL | Status: DC
  Administered 2024-01-24 – 2024-02-08 (×16): 81 mg via ORAL
  Filled 2024-01-24 (×16): qty 1

## 2024-01-24 MED ORDER — HEPARIN SODIUM (PORCINE) 5000 UNIT/ML IJ SOLN
5000.0000 [IU] | Freq: Three times a day (TID) | INTRAMUSCULAR | Status: DC
  Administered 2024-01-24 – 2024-02-08 (×45): 5000 [IU] via SUBCUTANEOUS
  Filled 2024-01-24 (×46): qty 1

## 2024-01-24 MED ORDER — ACETAMINOPHEN 325 MG PO TABS
650.0000 mg | ORAL_TABLET | ORAL | Status: DC | PRN
  Administered 2024-01-24 – 2024-02-05 (×14): 650 mg via ORAL
  Filled 2024-01-24 (×13): qty 2

## 2024-01-24 MED ORDER — MAGNESIUM HYDROXIDE 400 MG/5ML PO SUSP
30.0000 mL | Freq: Every day | ORAL | Status: DC | PRN
  Administered 2024-01-30 – 2024-02-03 (×2): 30 mL via ORAL
  Filled 2024-01-24 (×3): qty 30

## 2024-01-24 MED ORDER — VALACYCLOVIR HCL 500 MG PO TABS
500.0000 mg | ORAL_TABLET | Freq: Every day | ORAL | Status: DC
  Administered 2024-01-24 – 2024-02-08 (×16): 500 mg via ORAL
  Filled 2024-01-24 (×16): qty 1

## 2024-01-24 MED ORDER — POTASSIUM CHLORIDE 20 MEQ PO PACK
60.0000 meq | PACK | Freq: Once | ORAL | Status: AC
  Administered 2024-01-24: 20 meq via ORAL
  Filled 2024-01-24: qty 3

## 2024-01-24 MED ORDER — TAMSULOSIN HCL 0.4 MG PO CAPS
0.4000 mg | ORAL_CAPSULE | Freq: Two times a day (BID) | ORAL | Status: DC
Start: 2024-01-24 — End: 2024-02-08
  Administered 2024-01-24 – 2024-02-08 (×32): 0.4 mg via ORAL
  Filled 2024-01-24 (×31): qty 1

## 2024-01-24 MED ORDER — METOPROLOL SUCCINATE ER 25 MG PO TB24
25.0000 mg | ORAL_TABLET | Freq: Every day | ORAL | Status: DC
  Administered 2024-01-24: 25 mg via ORAL
  Filled 2024-01-24: qty 1

## 2024-01-24 MED ORDER — BICTEGRAVIR-EMTRICITAB-TENOFOV 50-200-25 MG PO TABS
1.0000 | ORAL_TABLET | Freq: Every day | ORAL | Status: DC
Start: 2024-01-24 — End: 2024-02-08
  Administered 2024-01-24 – 2024-02-08 (×16): 1 via ORAL
  Filled 2024-01-24 (×16): qty 1

## 2024-01-24 MED ORDER — FLUTICASONE FUROATE-VILANTEROL 200-25 MCG/ACT IN AEPB
1.0000 | INHALATION_SPRAY | Freq: Every day | RESPIRATORY_TRACT | Status: DC
  Administered 2024-01-27 – 2024-02-07 (×12): 1 via RESPIRATORY_TRACT
  Filled 2024-01-24 (×2): qty 28

## 2024-01-24 MED ORDER — POTASSIUM CHLORIDE CRYS ER 20 MEQ PO TBCR
40.0000 meq | EXTENDED_RELEASE_TABLET | ORAL | Status: AC
  Administered 2024-01-24 (×2): 40 meq via ORAL
  Filled 2024-01-24 (×2): qty 2

## 2024-01-24 MED ORDER — EMPAGLIFLOZIN 10 MG PO TABS
10.0000 mg | ORAL_TABLET | Freq: Every day | ORAL | Status: DC
Start: 2024-01-24 — End: 2024-02-08
  Administered 2024-01-24 – 2024-02-08 (×16): 10 mg via ORAL
  Filled 2024-01-24 (×16): qty 1

## 2024-01-24 MED ORDER — ONDANSETRON HCL 4 MG/2ML IJ SOLN
4.0000 mg | Freq: Four times a day (QID) | INTRAMUSCULAR | Status: DC | PRN
  Administered 2024-01-24 – 2024-01-28 (×6): 4 mg via INTRAVENOUS
  Filled 2024-01-24 (×6): qty 2

## 2024-01-24 MED ORDER — NICOTINE 14 MG/24HR TD PT24
14.0000 mg | MEDICATED_PATCH | TRANSDERMAL | Status: DC
Start: 2024-01-24 — End: 2024-02-08
  Administered 2024-01-24 – 2024-02-08 (×15): 14 mg via TRANSDERMAL
  Filled 2024-01-24 (×17): qty 1

## 2024-01-24 MED ORDER — FINASTERIDE 5 MG PO TABS
5.0000 mg | ORAL_TABLET | Freq: Every day | ORAL | Status: DC
  Administered 2024-01-24 – 2024-02-08 (×16): 5 mg via ORAL
  Filled 2024-01-24 (×16): qty 1

## 2024-01-24 MED ORDER — MAGNESIUM SULFATE 2 GM/50ML IV SOLN
2.0000 g | Freq: Once | INTRAVENOUS | Status: DC
  Filled 2024-01-24: qty 50

## 2024-01-24 MED ORDER — ALBUTEROL SULFATE (2.5 MG/3ML) 0.083% IN NEBU
2.5000 mg | INHALATION_SOLUTION | Freq: Four times a day (QID) | RESPIRATORY_TRACT | Status: DC | PRN
  Administered 2024-01-29 – 2024-02-01 (×5): 2.5 mg via RESPIRATORY_TRACT
  Filled 2024-01-24 (×7): qty 3

## 2024-01-24 NOTE — Progress Notes (Signed)
 Multiple episodes of VT & NSVT overnight. HR as high as 180.  60mEq potassium & 2g IV magnesium  ordered. Pt refused IV mag & only agreed to 1 of 3 potassium packets ordered d/t K+ 3.4 & Mag 1.9 labs collected 01/24/24 @ 20:00. Educated that potassium & mag medications could help prevent additional rate/rhythm issues & that IV lasix  received during dayshift can lead to loss of potasium. Educational materials provided regarding arrhythmias & prescribed medications. [place], MD notified via page & updated via secure chat. Cooper, Dayshift RN updated.

## 2024-01-24 NOTE — Progress Notes (Signed)
 Talked with Goodrich, Callie PA, about the rapid called on the patient. Low BP, chest pain, dizziness. EKG performed. Rapid response called at 1633 pm. Came up within 10-15 mins to see the patient. Patient had complained of chest pain with numbness. He is sittin onside of the bed right now and not complaining of pain.

## 2024-01-24 NOTE — ED Notes (Signed)
Pt ambulated to BR with steady gait. Tolerated well.  

## 2024-01-24 NOTE — ED Notes (Signed)
 Assumed pt care from Tan

## 2024-01-24 NOTE — Progress Notes (Signed)
 MEWS Progress Note  Patient Details Name: Leon Montoya MRN: [id number] DOB: [birth year] Today's Date: 01/24/2024   MEWS Flowsheet Documentation:  Assess: MEWS Score Temp: 97.6 F (36.4 C) BP: 96/83 MAP (mmHg): 88 Pulse Rate: (!) 106 ECG Heart Rate: (!) 106 Resp: (!) 22 Level of Consciousness: Alert SpO2: 100 % O2 Device: Room Air Assess: MEWS Score MEWS Temp: 0 MEWS Systolic: 1 MEWS Pulse: 1 MEWS RR: 1 MEWS LOC: 0 MEWS Score: 3 MEWS Score Color: Yellow Assess: SIRS CRITERIA SIRS Temperature : 0 SIRS Respirations : 1 SIRS Pulse: 1 SIRS WBC: 0 SIRS Score Sum : 2 SIRS Temperature : 0 SIRS Pulse: 1 SIRS Respirations : 1 SIRS WBC: 0 SIRS Score Sum : 2 Assess: if the MEWS score is Yellow or Red Were vital signs accurate and taken at a resting state?: Yes Does the patient meet 2 or more of the SIRS criteria?: Yes Does the patient have a confirmed or suspected source of infection?: No MEWS guidelines implemented : Yes, red Treat MEWS Interventions: Considered administering scheduled or prn medications/treatments as ordered Take Vital Signs Increase Vital Sign Frequency : Red: Q1hr x2, continue Q4hrs until patient remains green for 12hrs Escalate MEWS: Escalate: Red: Discuss with charge nurse and notify provider. Consider notifying RRT. If remains red for 2 hours consider need for higher level of care        Indigo Barbian I Atlee Kluth 01/24/2024, 5:25 PM

## 2024-01-24 NOTE — Significant Event (Signed)
 Rapid Response Event Note   Reason for Call :  HR 190s  Upon review of the monitor he had SVT rates 180s-190s  lasting about 3 minutes about 1614  Initial Focused Assessment:  Upon my arrival patient is back in ST rate 100-110s. He is alert and oriented. He states that he had sharp chest pain and tingling into his left arm that has resolved  He also felt short of breath and dizzy at that time.  He states his breathing is back to normal.  BP 92/75 right arm,  96/83 left arm RR 20 O2 sat 98-100% on RA  His lung sounds are clear Heart tones occasionally irregular. He does have occasional PVCs on the monitor.  He is on Amiodarone  gtt @ 30mg /hr   Interventions:  12 lead EKG done   Plan of Care:  RN to call if the patient has additional symptomatic tachycardia   Event Summary:   MD Notified: Dr Levern Call Time: 1633 Arrival Time: 1638 End Time: 1730  Elvin Portland, RN

## 2024-01-24 NOTE — Progress Notes (Signed)
  Patient Name: Jacky Dross Date of Encounter: 01/24/2024  Primary Cardiologist: None Electrophysiologist: Will Gladis Norton, MD  Interval Summary   Fatigued this am. Continued to have intermittent chest pressure and palpitations with NSVT.    Vital Signs    Vitals:   01/24/24 0330 01/24/24 0530 01/24/24 0630 01/24/24 0711  BP:  103/82 (!) 102/90   Pulse: 73 (!) 104 (!) 115   Resp: 20 15 17    Temp:    98.3 F (36.8 C)  TempSrc:    Oral  SpO2: 92% 97% 98%   Weight:      Height:        Intake/Output Summary (Last 24 hours) at 01/24/2024 0856 Last data filed at 01/24/2024 0404 Gross per 24 hour  Intake 200 ml  Output --  Net 200 ml   Filed Weights   01/23/24 1713  Weight: 84.8 kg    Physical Exam    GEN- The patient is fatigued and chronically ill appearing.  Lungs- Somewhat diminished throughout.  Cardiac- Tachy but regular, rate and rhythm, no murmurs, rubs or gallops GI- soft, NT, ND, + BS Extremities- no clubbing or cyanosis. No edema  Telemetry    Sinus tach 110-120s, had brief NSVT overnight (personally reviewed)  Hospital Course    Mr. Rippeon presents today for evaluation of worsening VT. He is a pleasant 63 year old man with a history of hypertension, depression, chronic systolic heart failure, HIV disease, and a history of polysubstance abuse.  The patient has had a history of ventricular tachycardia for several months.  He has been treated with amiodarone .  He was in his usual state of health until earlier today when he began to feel badly.  Alerts from our office demonstrated ventricular tachycardia for which he received multiple therapies.  The patient denies any shocks.  The patient is not felt to be a candidate for destination therapy.  He has a history of ongoing tobacco use and noncompliance.  The patient has had amiodarone  off-and-on in the past.  Most recently, he was placed on amiodarone  but stopped taking it when he had an episode of syncope  secondary to VT.   Assessment & Plan    VT storm Some NSVT overnight, no therapies.  Continue IV Amiodarone  re-load at least through today.  Potassium3.3* (02/06 0434) Magnesium   2.1 (02/05 1859) Creatinine, ser  1.41* (02/06 0434) Keep K > 4.0 and Mg > 2.0  Continue toprol  as tolerated  Hypokalemia Aggressive supplementation ordered  Acute on Chronic systolic CHF He appears relatively warm on exam, though with recent low output by cath.  Dr. Cherrie is aware of admission. No immediate need for consultation, but they are available if needed.  Not currently advanced therapy candidate.  Not marked volume overloaded, but is having some SOB moving around the room.  Will dose 80 mg IV lasix  x 1 and follow   For questions or updates, please contact [facility] Please consult [email or web address] for contact info under Cardiology/STEMI.  Signed, Ozell Prentice Passey, PA-C  01/24/2024, 8:56 AM

## 2024-01-24 NOTE — Progress Notes (Signed)
   01/24/24 1624  Vitals  BP (!) 76/65  MAP (mmHg) 71  BP Location Left Arm  BP Method Automatic  Patient Position (if appropriate) Sitting  Pulse Rate (!) 116  Pulse Rate Source Monitor  ECG Heart Rate (!) 117  Resp (!) 22  Level of Consciousness  Level of Consciousness Alert  MEWS COLOR  MEWS Score Color Red  Oxygen  Therapy  SpO2 98 %  O2 Device Room Air  MEWS Score  MEWS Temp 0  MEWS Systolic 2  MEWS Pulse 2  MEWS RR 1  MEWS LOC 0  MEWS Score 5  Rapid Response Notification  Name of Rapid Response RN Notified HEATHER RN     Rapid response called and notified

## 2024-01-24 NOTE — Plan of Care (Signed)
 Will continue to monitor patient.

## 2024-01-24 NOTE — ED Notes (Signed)
 Pt complains of chest pains and palpitations, states it feels like it does before I get shocked. Pt did have run of vtach and heart rate increased but lasted less than a minute. Pt blood pressure did drop when this occurred but is stable at this time. MD was notified in secure chat.

## 2024-01-24 NOTE — Progress Notes (Addendum)
   I was called shortly before 7pm with reports that patient had an episode of chest pain radiating down to his left arm in setting of tachycardia with rates in the 180s earlier this evening. Telemetry shows a 3 minute episode of severe tachycardia with rates in the 180s (the axis did change but QRS looked around 0.8 to 0.30ms so question SVT with aberrancy vs VT). BP dropped with this to as low as 76/65. He was given a 250 cc bolus of NaCl. Rapid response was called and by the time they arrived patient was back in sinus tachycardia with rates in the 100s to 110s. Symptoms had resolved. BP had improved. EKG from around that time shows sinus tachycardia, rate 108, with slightly ST elevation in V2-V3 compared to prior tracings but does not meet criteria for STEMI. There was confusion on which on-call provider was to be called which is the reason I was not called until later this evening. I went to see patient. He currently is chest pain free. He reports a little shortness of breath but it breathing comfortably on a flat surface. Telemetry shows sinus tachycardia with rates in the 100s. BP soft but stable. Most recent BP before leaving the room was 102/89.  On exam, he does not appear to be in any acute distress. He has no increased work of breathing. He has some decreased breath sounds in bilateral bases and some soft rhonchi but no significant crackles. His finger tips are slightly cool to the touch but otherwise extremities are warm.   He is already on IV Amiodarone .   Discussed with DOD (Dr. Francyne). Continue IV Amiodarone  load and continue to monitor. Potassium was 3.3 this morning. This was repleted. Will recheck a BMET and Magnesium  (will place a care order asking overnight provider to be called if Potassium <4 or Magnesium  <2). Will ask RN to notify us  if he has any recurrent sustained VT/ SVT.  Dare Sanger E Ruthanna Macchia, PA-C 01/24/2024 7:49 PM

## 2024-01-24 NOTE — Progress Notes (Signed)
 Notified Dr. Carolynne Citron of the patient had a widen QRS  11 beats and  14 beats at 0228. He states it is to be expected. No new orders noted.

## 2024-01-25 ENCOUNTER — Telehealth (HOSPITAL_COMMUNITY): Payer: Self-pay | Admitting: Pharmacy Technician

## 2024-01-25 ENCOUNTER — Other Ambulatory Visit (HOSPITAL_COMMUNITY): Payer: Self-pay

## 2024-01-25 DIAGNOSIS — I509 Heart failure, unspecified: Secondary | ICD-10-CM | POA: Diagnosis not present

## 2024-01-25 DIAGNOSIS — I429 Cardiomyopathy, unspecified: Secondary | ICD-10-CM

## 2024-01-25 DIAGNOSIS — I472 Ventricular tachycardia, unspecified: Secondary | ICD-10-CM | POA: Diagnosis not present

## 2024-01-25 LAB — CBC WITH DIFFERENTIAL/PLATELET
Abs Immature Granulocytes: 0.01 10*3/uL (ref 0.00–0.07)
Basophils Absolute: 0 10*3/uL (ref 0.0–0.1)
Basophils Relative: 1 %
Eosinophils Absolute: 0 10*3/uL (ref 0.0–0.5)
Eosinophils Relative: 0 %
HCT: 49.6 % (ref 39.0–52.0)
Hemoglobin: 17.8 g/dL — ABNORMAL HIGH (ref 13.0–17.0)
Immature Granulocytes: 0 %
Lymphocytes Relative: 34 %
Lymphs Abs: 1.2 10*3/uL (ref 0.7–4.0)
MCH: 33.4 pg (ref 26.0–34.0)
MCHC: 35.9 g/dL (ref 30.0–36.0)
MCV: 93.1 fL (ref 80.0–100.0)
Monocytes Absolute: 0.4 10*3/uL (ref 0.1–1.0)
Monocytes Relative: 11 %
Neutro Abs: 1.8 10*3/uL (ref 1.7–7.7)
Neutrophils Relative %: 54 %
Platelets: 176 10*3/uL (ref 150–400)
RBC: 5.33 MIL/uL (ref 4.22–5.81)
RDW: 13.2 % (ref 11.5–15.5)
WBC: 3.4 10*3/uL — ABNORMAL LOW (ref 4.0–10.5)
nRBC: 0 % (ref 0.0–0.2)

## 2024-01-25 LAB — COMPREHENSIVE METABOLIC PANEL
ALT: 44 U/L (ref 0–44)
AST: 38 U/L (ref 15–41)
Albumin: 2.7 g/dL — ABNORMAL LOW (ref 3.5–5.0)
Alkaline Phosphatase: 58 U/L (ref 38–126)
Anion gap: 11 (ref 5–15)
BUN: 12 mg/dL (ref 8–23)
CO2: 24 mmol/L (ref 22–32)
Calcium: 8.4 mg/dL — ABNORMAL LOW (ref 8.9–10.3)
Chloride: 107 mmol/L (ref 98–111)
Creatinine, Ser: 1.55 mg/dL — ABNORMAL HIGH (ref 0.61–1.24)
GFR, Estimated: 50 mL/min — ABNORMAL LOW (ref >60)
Glucose, Bld: 123 mg/dL — ABNORMAL HIGH (ref 70–99)
Potassium: 3.7 mmol/L (ref 3.5–5.1)
Sodium: 142 mmol/L (ref 135–145)
Total Bilirubin: 1.5 mg/dL — ABNORMAL HIGH (ref 0.0–1.2)
Total Protein: 5.4 g/dL — ABNORMAL LOW (ref 6.5–8.1)

## 2024-01-25 LAB — MAGNESIUM: Magnesium: 2 mg/dL (ref 1.7–2.4)

## 2024-01-25 LAB — LACTIC ACID, PLASMA: Lactic Acid, Venous: 1.8 mmol/L (ref 0.5–1.9)

## 2024-01-25 MED ORDER — FUROSEMIDE 10 MG/ML IJ SOLN: 80.0000 mg | Freq: Once | INTRAMUSCULAR | Status: DC

## 2024-01-25 MED ORDER — LORAZEPAM 2 MG/ML IJ SOLN
1.0000 mg | INTRAMUSCULAR | Status: DC | PRN
  Administered 2024-01-25 – 2024-02-06 (×14): 1 mg via INTRAVENOUS
  Filled 2024-01-25 (×15): qty 1

## 2024-01-25 MED ORDER — POTASSIUM CHLORIDE CRYS ER 20 MEQ PO TBCR
40.0000 meq | EXTENDED_RELEASE_TABLET | Freq: Once | ORAL | Status: AC
  Administered 2024-01-25: 40 meq via ORAL
  Filled 2024-01-25: qty 2

## 2024-01-25 MED ORDER — AMIODARONE IV BOLUS ONLY 150 MG/100ML
150.0000 mg | Freq: Once | INTRAVENOUS | Status: AC
  Administered 2024-01-25: 150 mg via INTRAVENOUS
  Filled 2024-01-25: qty 100

## 2024-01-25 MED ORDER — MORPHINE SULFATE (PF) 2 MG/ML IV SOLN
2.0000 mg | INTRAVENOUS | Status: DC | PRN
  Administered 2024-01-25 – 2024-02-08 (×16): 2 mg via INTRAVENOUS
  Filled 2024-01-25 (×16): qty 1

## 2024-01-25 MED ORDER — EPLERENONE 25 MG PO TABS
12.5000 mg | ORAL_TABLET | Freq: Every day | ORAL | Status: DC
  Administered 2024-01-25 – 2024-01-30 (×6): 12.5 mg via ORAL
  Filled 2024-01-25 (×7): qty 1

## 2024-01-25 MED ORDER — OFF THE BEAT BOOK
Freq: Once | Status: AC
  Filled 2024-01-25: qty 1

## 2024-01-25 NOTE — Progress Notes (Signed)
  Pt previously programmed for therapies starting at 200 bpm.   With several sustained episodes in the 180s (>20s), ATP only zone added starting at 179  ATP x 6 85% CL ATP x 6 81% CL  ATP schemes burst # increased for VT-2 zone  Parameters printed and placed in chart.

## 2024-01-25 NOTE — Consult Note (Signed)
 Advanced Heart Failure Team Consult Note   Primary Physician: Arloa Jarvis, NP Cardiologist:  None  Reason for Consultation: acute on chronic heart failure  HPI:    Ryan Long is seen today for evaluation of acute on chronic heart failure at the request of Dr. Waddell.   Ryan Long is a 63 y.o. male with HIV, HTN, VT, major depression, chronic systolic heart failure (nonischemic cardiomyopathy), suicidal ideation, and polysubstance abuse.    Ischemic studies in 2012 and 2015 negative. NICM was not felt to be related to HIV given his good CD4 counts. He is S/P St Jude ICD 12/2017.   EF by Echo has remained 20-25% with ok RV since 2018, most recently 2024.   In 5/22 referred for CPX test to objectively quantify exercise capacity. Unfortunately, he did not complete study and was lost to f/u  Has long and frequent history of leaving AMA and noncompliance.    Saw EP 9/22 and device interrogation showed NSVT. Seen in ED 10/22 after defib fired x 2. Interrogation showed WCT ?VT. Cards consulted and recommended starting amiodarone , but he left AMA.   Seen in ED 1/24 with dizziness and diaphoresis after using cocaine.    Seen in ED 09/18/23 with syncope, left AMA.   Scheduled for CPX in 10/24 but did not show says he just can't do it.    ICD interrogation in 10/24 reported ATP for VT. Told to increase Amio to 200 bid but says he didn't get the message. Seen in EP Clinic in 11/24. At that visit reported that PCP cut amio to 100 daily and cut lasix  to 10mg  daily, Toprol  to 25 daily and stopped eplernone. ICD interrogated and found to have one episode of VT/VF with chock on 09/18/23. No other episodes despite persistent dizziness. Amio increased to 200 bid then back to 200 daily.   Seen at [facility]  on 11/14/23 stopped Entresto , eplerenone  and cut Toprol  back due to low BP. Also stopped amio     LHC: Normal coronaries RHC 01/03/24: RA 10, PA 40/28(34), PW 24, CO/CI (TD)  3.2/1.6, PAPi 1.2   Underwent R/L cath for low output symptoms and normal coronaries 01/03/24. Wanted to admit him for evaluation for advanced therapies but he left AMA. Gets winded easily. Uses a scooter. No CP. Had episode of near syncope yesterday. Not taking amiodarone . Smoking about 8-9 cigarettes per day. Doing a little THC. Denies cocaine   ICD interrogation: 1/26 VT at 210 x 18s broke with ATP. 1/24 VT/VF 226 x 9s broke with ATP. Multiple episodes NSVT> Fluid ok. Activity level 4hr/day   Presented to the ED 01/23/24 with worsening VT and multiple shocks from ICD. Vitals were BP 111/87, HR 98, SpO2 99 on RA. Labs notable for K 3.9, Cr 1.3. EKG showed SVT 170 bpm and VT 199 bpm. He was started on IV amiodarone  and given a dose of IV Lasix  and admitted with EP service for further management. Device interrogation revealed episodes of monomorphic VT in the 180s. He was resumed on his home toprol  and jardiance . There has been some difficulty replacing electrolytes to maintain K >4 and Mg>2 d/t refusing IV supplementation.    Has continued to have intermittent ventricular arrhythmias, most recently overnight, where he converted spontaneously. Advanced Heart Failure consulted given history of low output and concern for further low output in the setting of VT.   Home Medications Prior to Admission medications   Medication Sig Start Date End Date Taking?  Authorizing Provider  albuterol  (VENTOLIN  HFA) 108 (90 Base) MCG/ACT inhaler Inhale 2 puffs into the lungs every 6 (six) hours as needed for wheezing or shortness of breath. 07/30/23  Yes Brien Belvie BRAVO, MD  aspirin  EC 81 MG tablet Take 81 mg by mouth in the morning. Swallow whole.   Yes [provider]  bictegravir-emtricitabine -tenofovir  AF (BIKTARVY ) 50-200-25 MG TABS tablet Take 1 tablet by mouth daily. Patient taking differently: Take 1 tablet by mouth in the morning. 08/15/23  Yes Fleeta Rothman, Jomarie SAILOR, MD  empagliflozin  (JARDIANCE ) 10 MG  TABS tablet Take 1 tablet (10 mg total) by mouth daily before breakfast. 12/01/22  Yes Milford, Harlene HERO, FNP  finasteride  (PROSCAR ) 5 MG tablet TAKE 1 TABLET(5 MG) BY MOUTH DAILY Patient taking differently: Take 5 mg by mouth in the morning. 03/15/23  Yes Brien Belvie BRAVO, MD  fluticasone  furoate-vilanterol (BREO ELLIPTA ) 200-25 MCG/ACT AEPB Inhale 1 puff into the lungs daily.   Yes [provider]  furosemide  (LASIX ) 20 MG tablet Take 40 mg by mouth in the morning.   Yes [provider]  IBUPROFEN  PO Take 400 mg by mouth every 6 (six) hours as needed for mild pain (pain score 1-3), moderate pain (pain score 4-6) or fever. Liquid gel   Yes [provider]  Melatonin 10 MG TABS Take 10 mg by mouth at bedtime.   Yes [provider]  metoprolol  succinate (TOPROL -XL) 25 MG 24 hr tablet Take 25 mg by mouth in the morning. Take with or immediately following a meal.   Yes [provider]  Multiple Vitamins-Minerals (MENS MULTIPLUS PO) Take 1 tablet by mouth daily.   Yes [provider]  tamsulosin  (FLOMAX ) 0.4 MG CAPS capsule TAKE 2 CAPSULES(0.8 MG) BY MOUTH DAILY AFTER SUPPER Patient taking differently: Take 0.4 mg by mouth 2 (two) times daily. 07/26/23  Yes Brien Belvie BRAVO, MD  triamcinolone  ointment (KENALOG ) 0.5 % Apply 1 Application topically 2 (two) times daily. Patient taking differently: Apply 1 Application topically daily. 08/15/23  Yes Brien Belvie BRAVO, MD  valACYclovir  (VALTREX ) 500 MG tablet Take 1 tablet (500 mg total) by mouth daily. Patient taking differently: Take 500 mg by mouth in the morning. 11/15/22  Yes Brien Belvie BRAVO, MD  amiodarone  (PACERONE ) 200 MG tablet Take 1 tablet (200 mg total) by mouth daily. Patient not taking: Reported on 01/23/2024 01/14/24   Bensimhon, Toribio SAUNDERS, MD  ENTRESTO  24-26 MG Take 1 tablet by mouth 2 (two) times daily. Patient not taking: Reported on 01/23/2024 12/29/23   [provider]  nicotine   (NICODERM CQ  - DOSED IN MG/24 HOURS) 21 mg/24hr patch Place 1 patch (21 mg total) onto the skin daily. Patient not taking: Reported on 01/14/2024 09/12/23   Fleeta Rothman, Jomarie SAILOR, MD  potassium chloride  SA (KLOR-CON  M) 20 MEQ tablet Take 2 tablets (40 mEq total) by mouth daily. 12/18/23   Milford, Harlene HERO, FNP  sildenafil  (VIAGRA ) 100 MG tablet Take 100 mg by mouth daily as needed for erectile dysfunction. Patient not taking: Reported on 01/23/2024    [provider]    Past Medical History: Past Medical History:  Diagnosis Date   Active smoker    AICD (automatic cardioverter/defibrillator) present    Alcohol  abuse    Allergy July 2023   Anxiety    AR (allergic rhinitis)    Bipolar 1 disorder (HCC)    Cervical lymphadenitis 04/20/2021   CHF (congestive heart failure) (HCC)    Chronic bronchitis (HCC)  Chronic systolic heart failure (HCC)    Controlled substance agreement signed 10/22/2018   COPD (chronic obstructive pulmonary disease) (HCC)    Cough 12/31/2018   Crack cocaine use    Depression    Genital herpes    HIV (human immunodeficiency virus infection) (HCC) dx'd 08/1993   HLD (hyperlipidemia)    Hypertension    NICM (nonischemic cardiomyopathy) (HCC)    Echocardiogram 06/28/11: EF 30-35%, mild MR, mild LAE;  No CAD by coronary CT angiogram 3/12 at [facility]   NSVT (nonsustained ventricular tachycardia) (HCC)    PTSD (post-traumatic stress disorder)     Past Surgical History: Past Surgical History:  Procedure Laterality Date   CARDIAC DEFIBRILLATOR PLACEMENT  01/08/2018   ICD IMPLANT N/A 01/08/2018   Procedure: ICD IMPLANT;  Surgeon: Inocencio Soyla Lunger, MD;  Location: [facility] INVASIVE CV LAB;  Service: Cardiovascular;  Laterality: N/A;   RIGHT/LEFT HEART CATH AND CORONARY ANGIOGRAPHY N/A 01/03/2024   Procedure: RIGHT/LEFT HEART CATH AND CORONARY ANGIOGRAPHY;  Surgeon: Cherrie Toribio SAUNDERS, MD;  Location: [facility] INVASIVE CV LAB;  Service: Cardiovascular;   Laterality: N/A;    Family History: Family History  Problem Relation Age of Onset   Glaucoma Mother    Mental illness Mother    Vision loss Mother    Hypertension Father    CAD Father    Mental illness Father    Alcohol  abuse Father    Drug abuse Father    Heart attack Father 69   Early death Father    Heart disease Father    Alcohol  abuse Brother    Drug abuse Brother    Drug abuse Brother     Social History: Social History   Socioeconomic History   Marital status: Single    Spouse name: Not on file   Number of children: Not on file   Years of education: Not on file   Highest education level: GED or equivalent  Occupational History   Not on file  Tobacco Use   Smoking status: Every Day    Current packs/day: 0.75    Average packs/day: 0.8 packs/day for 42.0 years (31.5 ttl pk-yrs)    Types: Cigarettes   Smokeless tobacco: Never   Tobacco comments:    hoping welbutrin will help  Vaping Use   Vaping status: Never Used  Substance and Sexual Activity   Alcohol  use: Not Currently    Alcohol /week: 6.0 standard drinks of alcohol     Comment: 01/08/2018 stopped 06/2017   Drug use: Not Currently    Types: Crack cocaine    Comment: 01/08/2018 stopped 06/2017   Sexual activity: Yes    Birth control/protection: Condom    Comment: pt. declined condoms  Other Topics Concern   Not on file  Social History Narrative   Not on file   Social Drivers of Health   Financial Resource Strain: Low Risk  (07/17/2023)   Overall Financial Resource Strain (CARDIA)    Difficulty of Paying Living Expenses: Not hard at all  Food Insecurity: No Food Insecurity (01/24/2024)   Hunger Vital Sign    Worried About Running Out of Food in the Last Year: Never true    Ran Out of Food in the Last Year: Never true  Transportation Needs: No Transportation Needs (01/24/2024)   PRAPARE - Administrator, Civil Service (Medical): No    Lack of Transportation (Non-Medical): No  Physical  Activity: Insufficiently Active (07/17/2023)   Exercise Vital Sign    Days of  Exercise per Week: 3 days    Minutes of Exercise per Session: 20 min  Stress: Stress Concern Present (07/17/2023)   [employer] of Occupational Health - Occupational Stress Questionnaire    Feeling of Stress : To some extent  Social Connections: Moderately Integrated (07/17/2023)   Social Connection and Isolation Panel [NHANES]    Frequency of Communication with Friends and Family: Twice a week    Frequency of Social Gatherings with Friends and Family: More than three times a week    Attends Religious Services: 1 to 4 times per year    Active Member of Clubs or Organizations: Yes    Attends Engineer, Structural: More than 4 times per year    Marital Status: Never married    Allergies:  No Known Allergies  Objective:    Vital Signs:   Temp:  [97.6 F (36.4 C)-98.5 F (36.9 C)] 98.5 F (36.9 C) (02/07 0411) Pulse Rate:  [100-117] 109 (02/07 0411) Resp:  [18-22] 20 (02/07 0411) BP: (76-139)/(56-93) 87/75 (02/07 0411) SpO2:  [96 %-100 %] 98 % (02/07 0411) Weight:  [83.9 kg-84.1 kg] 83.9 kg (02/07 0411) Last BM Date : 01/23/24  Weight change: Filed Weights   01/23/24 1713 01/24/24 1330 01/25/24 0411  Weight: 84.8 kg 84.1 kg 83.9 kg    Intake/Output:   Intake/Output Summary (Last 24 hours) at 01/25/2024 0954 Last data filed at 01/25/2024 0409 Gross per 24 hour  Intake --  Output 1900 ml  Net -1900 ml      Physical Exam    General: Well appearing. No distress on RA Cardiac: JVP not visible S1 and S2 present. No murmurs or rub. Abdomen: Soft, non-tender, non-distended. + BS. Extremities: Warm and dry. No rash, cyanosis. Neuro: Alert and oriented x3. Affect pleasant.   Telemetry   ST 90-110s, 15b NSVT this morning (personally reviewed)  EKG    ST 118 bpm 01/24/24 (personally reviewed)  Labs   Basic Metabolic Panel: Recent Labs  Lab 01/23/24 1859 01/24/24 0434  01/24/24 2001 01/25/24 0346  NA 142 139 141 142  K 3.9 3.3* 3.4* 3.7  CL 106 107 105 107  CO2 24 18* 23 24  GLUCOSE 73 115* 97 123*  BUN 11 14 13 12   CREATININE 1.30* 1.41* 1.49* 1.55*  CALCIUM  8.5* 8.4* 8.2* 8.4*  MG 2.1  --  1.9 2.0    Liver Function Tests: Recent Labs  Lab 01/25/24 0346  AST 38  ALT 44  ALKPHOS 58  BILITOT 1.5*  PROT 5.4*  ALBUMIN  2.7*   No results for input(s): LIPASE, AMYLASE in the last 168 hours. No results for input(s): AMMONIA in the last 168 hours.  CBC: Recent Labs  Lab 01/24/24 0434 01/25/24 0346  WBC 3.3* 3.4*  NEUTROABS  --  1.8  HGB 18.2* 17.8*  HCT 50.9 49.6  MCV 94.1 93.1  PLT 197 176    Cardiac Enzymes: No results for input(s): CKTOTAL, CKMB, CKMBINDEX, TROPONINI in the last 168 hours.  BNP: BNP (last 3 results) Recent Labs    09/28/23 0939 01/14/24 1517  BNP 189.3* 836.0*    ProBNP (last 3 results) No results for input(s): PROBNP in the last 8760 hours.   CBG: No results for input(s): GLUCAP in the last 168 hours.  Coagulation Studies: No results for input(s): LABPROT, INR in the last 72 hours.   Imaging   No results found.   Medications:     Current Medications:  aspirin  EC  81 mg  Oral Daily   bictegravir-emtricitabine -tenofovir  AF  1 tablet Oral Daily   empagliflozin   10 mg Oral QAC breakfast   finasteride   5 mg Oral Daily   fluticasone  furoate-vilanterol  1 puff Inhalation Daily   heparin   5,000 Units Subcutaneous Q8H   nicotine   14 mg Transdermal Q24H   tamsulosin   0.4 mg Oral BID   valACYclovir   500 mg Oral Daily    Infusions:  amiodarone  60 mg/hr (01/25/24 0840)   magnesium  sulfate bolus IVPB      Patient Profile   Ryan Long is a 63 y.o. male with HIV, HTN, major depression, chronic systolic heart failure (nonischemic cardiomyopathy), suicidal ideation, and polysubstance abuse.    No admitted to VT and concern for low output.  Assessment/Plan    1. Acute on Chronic Systolic Heart Failure:  - NICM - EF has remained 20-25% with ok RV since 2018  - S/P St Jude ICD.  - Scheduled for CPX in 10/24 but says he didn't show because he felt to poorly to walk onTM - Had R/L cath 01/03/24. Normal coronaries LV = 86/26 RA  10 PA 40/28 (34) PCW 24 Thermo 3.8/1.9 Fick 3.2/1.6 PAPi 1.2. Adivsed admission, however left AMA post cath - concern for lowoutput given recent cath, low BP, and tachycardia. Lactic acid 1.8 - Would not start inotropic therapy in the setting of VT. Likely not be a candidate for home inotrope and PICC line with substance abuse history and noncompliance.  - GDMT limited by BP - continue Jardiance  10 mg daily - no bb with concern for low output - start epleronone 12.5 mg daily to assist with K  - Recent cath confirm biventricular dysfunction with low output. Unfortunately not transplant candidate with ongoing tobacco use and non-compliance. We did talk about potential VAD candidacy but PAPI is low and he recently signed out of hospital AMA post-cath so I am concerned his compliance will not support VAD implantation   2. NSVT/VT/VF - Frequent NSVT 8/22 - Had VT/VF with shock 10/24 - Now presenting with SVT/VT and ICD shocks - EP primary - on amio gtt 60 mg/hr   3. HIV  - He follows with ID - continue current regimen   4. Tobacco use disorder   - Still smoking 1/2 ppd  - I again discussed need for cessation to permit advanced HF theparies   5. Cocaine abuse - Reports no further use since 02/2022. - He is adamant about this   6. Noncompliance - this has been a major hurdle in his care - recently signed out AMA post-cath  7. Hypokalemia - goal K>4 and Mg >2 - refusing IV replacement - epleronone as above.   Length of Stay: 2  Ryan Batten, NP  01/25/2024, 9:54 AM  Advanced Heart Failure Team Pager [phone] (M-F; 7a - 5p)  Please contact [facility] for night-coverage after hours (4p -7a ) and weekends on  [email or web address]

## 2024-01-25 NOTE — Telephone Encounter (Signed)
 Patient Product/process Development Scientist completed.    The patient is insured through [facility]. Patient has Medicare and is not eligible for a copay card, but may be able to apply for patient assistance or Medicare RX Payment Plan (Patient Must reach out to their plan, if eligible for payment plan), if available.    Ran test claim for Farxiga 10 mg and the current 30 day co-pay is $0.00.  Ran test claim for Jardiance  10 mg and was filled for a 90 day supply on 12/18/2023   This test claim was processed through [facility]- copay amounts may vary at other pharmacies due to pharmacy/plan contracts, or as the patient moves through the different stages of their insurance plan.     Reyes Sharps, CPHT Pharmacy Technician III Certified Patient Advocate [facility] Patient Advocate Team Direct Number: [phone]  Fax: [phone]

## 2024-01-25 NOTE — Plan of Care (Signed)
  Problem: Education: Goal: Knowledge of General Education information will improve Description: Including pain rating scale, medication(s)/side effects and non-pharmacologic comfort measures Outcome: Progressing   Problem: Clinical Measurements: Goal: Will remain free from infection Outcome: Progressing   Problem: Nutrition: Goal: Adequate nutrition will be maintained Outcome: Progressing   Problem: Elimination: Goal: Will not experience complications related to urinary retention Outcome: Progressing   Problem: Safety: Goal: Ability to remain free from injury will improve Outcome: Progressing   Problem: Skin Integrity: Goal: Risk for impaired skin integrity will decrease Outcome: Progressing   Problem: Education: Goal: Knowledge of disease or condition will improve Outcome: Progressing Goal: Understanding of medication regimen will improve Outcome: Progressing

## 2024-01-25 NOTE — Evaluation (Signed)
 Physical Therapy Evaluation Patient Details Name: Ryan Long MRN: [id number] DOB: 1961/12/07 Today's Date: 01/25/2024  History of Present Illness  Pt is a 63 y.o. male presenting on 01/23/24 with palpitations, lightheadedness, and AICD problems. Admitted with VT. Rapid called 2/6 for SVT rates 180s-190s. PHMx: AICD, alcohol  abuse, Bipolar 1 disorder, cervical lymphadenitis, CHF, COPD, crack cocaine use, depression, genital herpes, HIV, HLD, HTN, NSVT, PTSD   Clinical Impression  Pt presents with condition above and deficits mentioned below, see PT Problem List. PTA, pt was rented a room at a [facility], which has 3-6 STE depending on the entrance. His bedroom is on the main level but the laundry area is in the basement. He is independent without DME at baseline. Currently, pt is demonstrating deficits primarily in muscular and cardiovascular endurance. He is limited by nausea. He did have x1 successful bout of emesis during this session. He was able to perform all functional mobility without UE support, LOB, or physical assistance though, but he was limited to only tolerating ambulating up to ~24 ft today due to the deficits and limitations listed above. Pt will likely progress well as his HR and symptoms improve, thus do not anticipate a need for follow-up PT at d/c. However, will continue to follow acutely to educate pt on energy conservation techniques, teach pt how to safely utilize a rollator, and facilitate increased activity tolerance. Of note, pt crying during session due to how he is currently feeling and his living situation. Notified TOC that pt verbalized he does not feel safe at his Recovery House due to other residents' use of illegal substances and alcohol . Offered pt to consult chaplain services, which he accepted. Notified RN to consult chaplain services per pt request. Pt also reporting he has had a poor appetite for 2-3 weeks now due to nausea.       If plan is  discharge home, recommend the following: Assistance with cooking/housework   Can travel by Nurse, Mental Health (4 wheels)  Recommendations for Other Services       Functional Status Assessment Patient has had a recent decline in their functional status and demonstrates the ability to make significant improvements in function in a reasonable and predictable amount of time.     Precautions / Restrictions Precautions Precautions: Other (comment) Precaution Comments: watch HR & BP Restrictions Weight Bearing Restrictions Per Provider Order: No      Mobility  Bed Mobility Overal bed mobility: Modified Independent             General bed mobility comments: Pt able to transition supine <> sit R EOB with slightly increased time, but no assistance needed    Transfers Overall transfer level: Needs assistance Equipment used: None Transfers: Sit to/from Stand Sit to Stand: Supervision           General transfer comment: Pt able to slowly come to stand from EOB without LOB, supervision for safety    Ambulation/Gait Ambulation/Gait assistance: Supervision, Contact guard assist Gait Distance (Feet): 24 Feet Assistive device: None Gait Pattern/deviations: Step-through pattern, Decreased stride length Gait velocity: reduced Gait velocity interpretation: <1.8 ft/sec, indicate of risk for recurrent falls   General Gait Details: Pt took slow, small steps forwards and backwards in the room without UE support. CGA-supervision provided for safety. No LOB. Pt needed x1 standing rest break, leaning himself against the window seal.  Stairs  Wheelchair Mobility     Tilt Bed    Modified Rankin (Stroke Patients Only)       Balance Overall balance assessment: No apparent balance deficits (not formally assessed)                                           Pertinent Vitals/Pain Pain Assessment Pain  Assessment: Faces Faces Pain Scale: Hurts even more Pain Location: discomfort with nausea Pain Descriptors / Indicators: Other (Comment), Crying, Discomfort (nausea) Pain Intervention(s): Limited activity within patient's tolerance, Monitored during session, Repositioned    Home Living Family/patient expects to be discharged to:: Group home (Recovery Home in which pt rents a room) Living Arrangements: Alone   Type of Home: House Home Access: Stairs to enter Entrance Stairs-Rails: Can reach both (at either entrance) Entrance Stairs-Number of Steps: 5-6 (in back, which is the entrance he typically uses; x3-4 in front)   Home Layout: Laundry or work area in basement;Two level (laundry in basement, which is the second level) Home Equipment: None Additional Comments: pt rents a room in this Recovery House and shares a bathroom with other residents    Prior Function Prior Level of Function : Independent/Modified Independent;Driving             Mobility Comments: No AD; fell 1x on October 1st due to syncope episode, which reportedly started this whole thing ADLs Comments: Drives a gas scooter     Extremity/Trunk Assessment   Upper Extremity Assessment Upper Extremity Assessment: Overall WFL for tasks assessed    Lower Extremity Assessment Lower Extremity Assessment: Generalized weakness (with endurance deficits)    Cervical / Trunk Assessment Cervical / Trunk Assessment: Normal  Communication   Communication Communication: No apparent difficulties  Cognition Arousal: Alert Behavior During Therapy: WFL for tasks assessed/performed Overall Cognitive Status: Within Functional Limits for tasks assessed                                 General Comments: Pt understandably emotional and crying about how he is feeling and his situation.        General Comments General comments (skin integrity, edema, etc.): HR up to 120s; BP soft but seems stable compared  to prior entries on monitor from earlier today    Exercises     Assessment/Plan    PT Assessment Patient needs continued PT services  PT Problem List Decreased strength;Decreased activity tolerance;Decreased mobility;Cardiopulmonary status limiting activity       PT Treatment Interventions DME instruction;Gait training;Stair training;Functional mobility training;Therapeutic activities;Therapeutic exercise;Balance training;Neuromuscular re-education;Patient/family education    PT Goals (Current goals can be found in the Care Plan section)  Acute Rehab PT Goals Patient Stated Goal: to feel better PT Goal Formulation: With patient Time For Goal Achievement: 02/08/24 Potential to Achieve Goals: Good    Frequency Min 1X/week     Co-evaluation               AM-PAC PT 6 Clicks Mobility  Outcome Measure Help needed turning from your back to your side while in a flat bed without using bedrails?: None Help needed moving from lying on your back to sitting on the side of a flat bed without using bedrails?: None Help needed moving to and from a bed to a chair (including a wheelchair)?: A Little Help needed standing  up from a chair using your arms (e.g., wheelchair or bedside chair)?: A Little Help needed to walk in hospital room?: A Little Help needed climbing 3-5 steps with a railing? : A Little 6 Click Score: 20    End of Session   Activity Tolerance: Other (comment) (limited by nausea) Patient left: in bed;with call bell/phone within reach;with bed alarm set Nurse Communication: Mobility status;Other (comment) (nausea, bout of emesis, pt request to speak with chaplain) PT Visit Diagnosis: Other abnormalities of gait and mobility (R26.89);Muscle weakness (generalized) (M62.81);History of falling (Z91.81);Dizziness and giddiness (R42)    Time: 8757-8695 PT Time Calculation (min) (ACUTE ONLY): 22 min   Charges:   PT Evaluation $PT Eval Low Complexity: 1 Low   PT General  Charges $$ ACUTE PT VISIT: 1 Visit         Theo Ferretti, PT, DPT Acute Rehabilitation Services  Office: [phone]   Theo CHRISTELLA Ferretti 01/25/2024, 1:53 PM

## 2024-01-25 NOTE — Progress Notes (Signed)
 Rounding Note    Patient Name: Ryan Long Date of Encounter: 01/25/2024  [facility] Cardiologist: previously Dr. Meldon AHF: Dr. Cherrie EP:   Subjective   Very pleasant this morning, finally got some sleep, worried about his heart/heart rhythm Denies SOB, no CP  Inpatient Medications    Scheduled Meds:  aspirin  EC  81 mg Oral Daily   bictegravir-emtricitabine -tenofovir  AF  1 tablet Oral Daily   empagliflozin   10 mg Oral QAC breakfast   finasteride   5 mg Oral Daily   fluticasone  furoate-vilanterol  1 puff Inhalation Daily   heparin   5,000 Units Subcutaneous Q8H   metoprolol  succinate  25 mg Oral Daily   nicotine   14 mg Transdermal Q24H   tamsulosin   0.4 mg Oral BID   valACYclovir   500 mg Oral Daily   Continuous Infusions:  amiodarone  30 mg/hr (01/24/24 2058)   amiodarone      magnesium  sulfate bolus IVPB     PRN Meds: acetaminophen , albuterol , magnesium  hydroxide, ondansetron  (ZOFRAN ) IV   Vital Signs    Vitals:   01/24/24 1712 01/24/24 1948 01/24/24 2300 01/25/24 0411  BP: 96/83 93/79 (!) 139/93 (!) 87/75  Pulse: (!) 106 100 (!) 117 (!) 109  Resp:  20 18 20   Temp:  98 F (36.7 C) 98.3 F (36.8 C) 98.5 F (36.9 C)  TempSrc:  Oral Oral Oral  SpO2: 100% 99% 97% 98%  Weight:    83.9 kg  Height:        Intake/Output Summary (Last 24 hours) at 01/25/2024 0727 Last data filed at 01/25/2024 0409 Gross per 24 hour  Intake --  Output 2300 ml  Net -2300 ml      01/25/2024    4:11 AM 01/24/2024    1:30 PM 01/23/2024    5:13 PM  Last 3 Weights  Weight (lbs) 184 lb 14.4 oz 185 lb 4.8 oz 187 lb  Weight (kg) 83.87 kg 84.052 kg 84.823 kg      Telemetry    SR/ST, mostly 90's > 110's or so, recurrent NSVTs and sustained Got one spin of ATP yesterday - Personally Reviewed  ECG    Yesterday: ST 105bpm, PVCs,  ST 108bpm ST 118bpm  - Personally Reviewed  Physical Exam   GEN: No acute distress.   Neck: No JVD Cardiac: RRR, no murmurs,  rubs, or gallops.  Respiratory: CTA b/l. GI: Soft, nontender, non-distended  MS: No edema; No deformity. Neuro:  Nonfocal  Psych: Normal affect   Labs    High Sensitivity Troponin:  No results for input(s): TROPONINIHS in the last 720 hours.   Chemistry Recent Labs  Lab 01/23/24 1859 01/24/24 0434 01/24/24 2001 01/25/24 0346  NA 142 139 141 142  K 3.9 3.3* 3.4* 3.7  CL 106 107 105 107  CO2 24 18* 23 24  GLUCOSE 73 115* 97 123*  BUN 11 14 13 12   CREATININE 1.30* 1.41* 1.49* 1.55*  CALCIUM  8.5* 8.4* 8.2* 8.4*  MG 2.1  --  1.9 2.0  PROT  --   --   --  5.4*  ALBUMIN   --   --   --  2.7*  AST  --   --   --  38  ALT  --   --   --  44  ALKPHOS  --   --   --  58  BILITOT  --   --   --  1.5*  GFRNONAA >60 56* 53* 50*  ANIONGAP 12 14  13 11    Lipids No results for input(s): CHOL, TRIG, HDL, LABVLDL, LDLCALC, CHOLHDL in the last 168 hours.  Hematology Recent Labs  Lab 01/24/24 0434 01/25/24 0346  WBC 3.3* 3.4*  RBC 5.41 5.33  HGB 18.2* 17.8*  HCT 50.9 49.6  MCV 94.1 93.1  MCH 33.6 33.4  MCHC 35.8 35.9  RDW 13.3 13.2  PLT 197 176   Thyroid  No results for input(s): TSH, FREET4 in the last 168 hours.  BNPNo results for input(s): BNP, PROBNP in the last 168 hours.  DDimer No results for input(s): DDIMER in the last 168 hours.   Radiology    DG Chest Portable 1 View Result Date: 01/23/2024 CLINICAL DATA:  Shortness of breath.  Defibrillator firing. EXAM: PORTABLE CHEST 1 VIEW COMPARISON:  12/04/2023 FINDINGS: Cardiac pacemaker with lead tip over the RA region, unchanged. Cardiac enlargement. Lungs are clear. No pleural effusion or pneumothorax. Mediastinal contours appear intact. IMPRESSION: Cardiac enlargement.  No evidence of active pulmonary disease. Electronically Signed   By: Elsie Gravely M.D.   On: 01/23/2024 18:26    Cardiac Studies    01/03/2024: R/LHC Findings: Ao =88/68 (78) LV = 86/26 RA = 10 RV = 45/13 PA = 40/28 (34) PCW =  24 Thermo CO/CI = 3.8/1.9 Fick CO/CI = 3.2/1.6 PVR = 3.1 (Fick) 2.7 (TD) Ao sat = 95% PA sat = 64%, 60% PAPi = 1.2   Assessment: 1. Normal coronaries 2. Severe NICM with severe biventricular dysfunction LVEF 25% 3. Elevated filling pressures with low cardiac output   Plan/Discussion:    Admit for HF optimization and assessment for candidacy for advanced therapies.    09/28/23: TTE 1. Left ventricular ejection fraction, by estimation, is 20 to 25%. The  left ventricle has severely decreased function. The left ventricle  demonstrates global hypokinesis. The left ventricular internal cavity size  was mildly to moderately dilated. Left  ventricular diastolic parameters are consistent with Grade I diastolic  dysfunction (impaired relaxation).   2. Right ventricular systolic function is normal. The right ventricular  size is normal. Tricuspid regurgitation signal is inadequate for assessing  PA pressure.   3. The mitral valve is normal in structure. Mild mitral valve  regurgitation.   4. The aortic valve is normal in structure. Aortic valve regurgitation is  not visualized.   5. The inferior vena cava is normal in size with greater than 50%  respiratory variability, suggesting right atrial pressure of 3 mmHg.      09/27/2021: TTE  1. Left ventricular ejection fraction, by estimation, is 20 to 25%. The  left ventricle has severely decreased function. The left ventricle  demonstrates global hypokinesis. The left ventricular internal cavity size  was mildly to moderately dilated. Left  ventricular diastolic parameters are consistent with Grade I diastolic  dysfunction (impaired relaxation).   2. Right ventricular systolic function is normal. The right ventricular  size is normal. Tricuspid regurgitation signal is inadequate for assessing  PA pressure.   3. The mitral valve is normal in structure. Mild mitral valve  regurgitation.   4. The aortic valve is normal in structure.  Aortic valve regurgitation is  not visualized.   5. The inferior vena cava is normal in size with greater than 50%  respiratory variability, suggesting right atrial pressure of 3 mmHg.   Comparison(s): No significant change from prior study.      12/13/2021 TTE SUMMARY  CPS 02-26-14 EF has declined.  Echo contrast study was not performed due to  the patient refusal.  Ectopy during study.  There is mild concentric left ventricular hypertrophy.  The left ventricular size is normal.  LV ejection fraction = 25-30%.  Left ventricular systolic function is severely reduced.  Left ventricular filling pattern is indeterminate.  There is severe global hypokinesis of the left ventricle.  Device lead in the right ventricle  The right ventricle is mildly dilated.  The left atrium is mildly dilated.  The right atrium is mildly dilated.  There is mild mitral regurgitation.  There was insufficient TR detected to calculate RV systolic pressure.  There is a pacemaker lead in the right ventricle.   Last Stress Test  01/04/2022 IMPRESSION:  1. Images are consistent with dilated cardiomyopathy with a fixed  inferior wall defect and no significant reversibility. Of interest is the  fact that there is significant reverse redistribution of the septal wall  and anterior wall which is of unknown clinical significance   2. Calculated ejection fraction 17%   3. Dilated cardiomyopathy with an ejection fraction of 17% calculated and  a fixed defect of the inferior wall without significant reversibility.   Patient Profile     63 y.o. male h/o NICM, chronic sCHF s/p single chamber Abbott ICD, HLD, HIV, substance abuse (ETOH, THC, Cocaine), CKD II & polycythemia  Known hx of VT Long hx of noncompliance complicating his care Last seen in our clinic by myself 11/02/23, lengthy visit, a lot of symptoms, had VT in Oct Discussed meds at length Plan of care with in clinic MD Afterwards reported very unhappy  with his care and cancelled all future visits with our team  Admitted 01/03/24 > HF exacerbation > R/LHC, planned for admission/HF magement, left AMA  not felt to be a candidate for destination therapy. He has a history of ongoing tobacco use and noncompliance   ER visit 01/16/24 for ICD shock > left AMA angry his room didn't have a TV  Admitted this stay feeling poorly > multiple VT episodes, therapies, had stopped his amiodarone  Not felt to be overtly volume OL Started on IV amio Device interrogation revealed episodes of monomorphic VT in the 180s. Faster VT's have since been successfully treated with ATP previously.  Added a zone with ATP starting in the 170s.   Device History: Abbott Single Chamber ICD implanted 01/08/2018    + appropriate tx   AAD hx amiodarone , started 03/2023 at [facility]    Assessment & Plan    VT Known for him Recurrent yesterday Re-bolus IV amiodarone  >> gtt to 60 K+ 3.7 > will replace Mag 2.0   NICM Chronic CHF He does not appear volume OL Got a single dose of lasix  here IV Denies SOB Extremities are warm, denies nausea Hypotension waxes/wanes >  Hold BB Off out pt HF meds I have asked HF team to see him  Creat 1.55, not far from his baseline  HIV Home meds   For questions or updates, please contact [facility] Please consult [email or web address] for contact info under        Signed, Charlies Macario Arthur, PA-C  01/25/2024, 7:27 AM

## 2024-01-25 NOTE — Care Management Important Message (Signed)
 Important Message  Patient Details  Name: Ryan Long MRN: [id number] Date of Birth: 1960-12-23   Important Message Given:  Yes - Medicare IM     Janith Melnick 01/25/2024, 12:08 PM

## 2024-01-25 NOTE — TOC Initial Note (Addendum)
 Transition of Care [facility]) - Initial/Assessment Note    Patient Details  Name: Ryan Long MRN: [id number] Date of Birth: Apr 04, 1961  Transition of Care [facility]) CM/SW Contact:    Arlana JINNY Nicholaus [place] Phone Number: [phone] 01/25/2024, 3:10 PM  Clinical Narrative:   HF CSW met with pt at bedside. Pt stated that he lives at [facility]. Pt stated that he has no history of HH services.Pt stated that he does not use any equipment. Pt stated that he does not have a scale. Pt stated that his PCP is Daphne Lesches. CSW will schedule a hospital follow up appointment closer to dc. Pt agrees. Pt stated that he receives disability. CSW provided pt with housing resources.   TOC will continue following.                   Barriers to Discharge: Continued Medical Work up   Patient Goals and CMS Choice            Expected Discharge Plan and Services       Living arrangements for the past 2 months: Boarding House                                      Prior Living Arrangements/Services Living arrangements for the past 2 months: [employer] Lives with:: Roommate Patient language and need for interpreter reviewed:: Yes Do you feel safe going back to the place where you live?: No   concerns about drug use in the home  Need for Family Participation in Patient Care: No (Comment) Care giver support system in place?: No (comment)   Criminal Activity/Legal Involvement Pertinent to Current Situation/Hospitalization: No - Comment as needed  Activities of Daily Living   ADL Screening (condition at time of admission) Independently performs ADLs?: No Does the patient have a NEW difficulty with bathing/dressing/toileting/self-feeding that is expected to last >3 days?: No Does the patient have a NEW difficulty with getting in/out of bed, walking, or climbing stairs that is expected to last >3 days?: Yes (Initiates electronic notice to provider for possible PT  consult) Does the patient have a NEW difficulty with communication that is expected to last >3 days?: No Is the patient deaf or have difficulty hearing?: No Does the patient have difficulty seeing, even when wearing glasses/contacts?: No Does the patient have difficulty concentrating, remembering, or making decisions?: No  Permission Sought/Granted                  Emotional Assessment Appearance:: Appears stated age Attitude/Demeanor/Rapport: Engaged   Orientation: : Oriented to Self Alcohol  / Substance Use: Not Applicable    Admission diagnosis:  Ventricular tachycardia (HCC) [I47.20] VT (ventricular tachycardia) (HCC) [I47.20] Acute on chronic systolic CHF (congestive heart failure) (HCC) [I50.23] Patient Active Problem List   Diagnosis Date Noted   Ventricular tachycardia (HCC) 01/23/2024   Acute on chronic systolic CHF (congestive heart failure) (HCC) 01/03/2024   Asthmatic bronchitis 01/24/2023   Rash and nonspecific skin eruption 12/19/2022   Arm numbness 11/15/2022   Aortic atherosclerosis (HCC) 11/15/2022   Chronic kidney disease 08/08/2022   Healthcare maintenance 08/08/2022   Cigarette smoker 01/26/2022   ETD (Eustachian tube dysfunction), right 01/26/2022   Allergy to pollen 01/26/2022   Polycythemia 12/08/2021   Vitamin D deficiency 12/08/2021   Hip pain 01/06/2021   VT (ventricular tachycardia) (HCC) 08/25/2020   HIV positive (HCC)  07/12/2020   Atrial fibrillation (HCC) 06/10/2020   BPH (benign prostatic hyperplasia) 01/28/2020   HFrEF (heart failure with reduced ejection fraction) (HCC) 01/28/2020   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   Achilles tendinitis, left leg 04/03/2019   AICD (automatic cardioverter/defibrillator) present 08/15/2018   Abnormal EKG 06/28/2018   Adjustment disorder with mixed disturbance of emotions and conduct    NICM (nonischemic cardiomyopathy) (HCC) 01/08/2018   Anxiety 12/20/2017   Bipolar 1 disorder (HCC) 10/10/2017    Chronic systolic heart failure (HCC) 05/13/2017   HSV-2 infection 07/17/2016   History of attempted suicide 04/14/2016   History of alcoholism (HCC) 04/14/2016   History of substance abuse (HCC) 04/14/2016   Constipation 01/17/2016   COPD exacerbation (HCC) 10/04/2015   Abnormal thyroid  stimulating hormone (TSH) level 10/04/2015   MDD (major depressive disorder), recurrent episode, severe (HCC) 09/23/2015   Hypertension 09/10/2015   PTSD (post-traumatic stress disorder) 08/07/2015   Primary insomnia 04/02/2015   Herpesviral infection of penis 04/02/2015   Generalized anxiety disorder 05/29/2014   Tobacco use disorder 02/17/2013   Heart failure (HCC) 09/19/2011   Other primary cardiomyopathies 08/24/2011   Human immunodeficiency virus (HIV) disease (HCC) 02/01/2009   Recurrent HSV (herpes simplex virus) 02/01/2009   Hyperlipidemia 02/01/2009   Allergic rhinitis 02/01/2009   PCP:  Arloa Jarvis, NP Pharmacy:   [id number] [address] [place], [place] - [address] AT [facility] OF [address] [place] [address] Phone: [phone] Fax: [phone]  [id number] [place], [place] - [address] AT [facility] OF [address] [facility] [address] Phone: [phone] Fax: [phone]     Social Drivers of Health (SDOH) Social History: SDOH Screenings   Food Insecurity: No Food Insecurity (01/24/2024)  Housing: Low Risk  (01/24/2024)  Recent Concern: Housing - Medium Risk (10/26/2023)   Received from [facility]  Transportation Needs: No Transportation Needs (01/24/2024)  Utilities: Not At Risk (01/24/2024)  Alcohol  Screen: Low Risk  (07/17/2023)  Depression (PHQ2-9): Low Risk  (08/01/2023)  Financial Resource Strain: Low Risk  (07/17/2023)  Physical Activity: Insufficiently Active (07/17/2023)  Social Connections: Moderately Integrated (07/17/2023)  Stress: Stress Concern Present (07/17/2023)  Tobacco Use:  High Risk (01/24/2024)  Health Literacy: Adequate Health Literacy (07/18/2023)   SDOH Interventions:     Readmission Risk Interventions     No data to display

## 2024-01-25 NOTE — Progress Notes (Addendum)
   01/25/24 1527  Spiritual Encounters  Type of Visit Initial  Care provided to: Patient  Reason for visit Routine spiritual support   Reason for Visit: Chaplain responding to Spiritual Consult that Pt has a new and difficult diagnosis and would like to talk with a chaplain and receive prayer.  Time of Visit: 20 Minutes  Description of Visit: Chaplain arrived to find Pt in the bed looking like he was almost asleep.  I introduced myself and asked if I should return at another time. Pt stated that now was good.   Pt presented as discouraged and despairing.  I am unaware of what the doctors actually told him about his condition, but Pt has received it as a short term death sentence.   Chaplain explored the emotional pain of the pain who lamented the lack of support he has outside the hospital, stating that he is entirely alone.  Pt laments his living situation in a recovery house where very few of the people there are actually recovering. Pt states that he does not feel safe there.   Exploring further, the Pt places little faith in the new medications of the "heart pump" he described doctors are wanting to try.   After approx. 20 minutes of processing and exploration, chaplain received a Code Blue page and needed to go.  Pt was understanding and ask chaplain to return when he could.  Plan of Care: Chaplain will return to the Pt before leaving this afternoon.  Chaplain services remain available by Spiritual Consult or for emergent cases, paging [phone]  Chaplain Maude Roll, MDiv [email or web address] [phone]

## 2024-01-26 DIAGNOSIS — I472 Ventricular tachycardia, unspecified: Secondary | ICD-10-CM | POA: Diagnosis not present

## 2024-01-26 LAB — BASIC METABOLIC PANEL
Anion gap: 13 (ref 5–15)
BUN: 17 mg/dL (ref 8–23)
CO2: 22 mmol/L (ref 22–32)
Calcium: 8.6 mg/dL — ABNORMAL LOW (ref 8.9–10.3)
Chloride: 103 mmol/L (ref 98–111)
Creatinine, Ser: 1.88 mg/dL — ABNORMAL HIGH (ref 0.61–1.24)
GFR, Estimated: 40 mL/min — ABNORMAL LOW (ref >60)
Glucose, Bld: 134 mg/dL — ABNORMAL HIGH (ref 70–99)
Potassium: 3.4 mmol/L — ABNORMAL LOW (ref 3.5–5.1)
Sodium: 138 mmol/L (ref 135–145)

## 2024-01-26 LAB — MAGNESIUM: Magnesium: 2 mg/dL (ref 1.7–2.4)

## 2024-01-26 MED ORDER — POTASSIUM CHLORIDE CRYS ER 20 MEQ PO TBCR
40.0000 meq | EXTENDED_RELEASE_TABLET | Freq: Once | ORAL | Status: AC
  Administered 2024-01-26: 40 meq via ORAL
  Filled 2024-01-26: qty 2

## 2024-01-26 MED ORDER — FUROSEMIDE 40 MG PO TABS
40.0000 mg | ORAL_TABLET | Freq: Every day | ORAL | Status: DC
  Filled 2024-01-26 (×4): qty 1

## 2024-01-26 NOTE — Progress Notes (Signed)
   Rounding Note    Patient Name: Ryan Long Date of Encounter: 01/26/2024  [facility] Cardiologist: None   Subjective   No acute events overnight.  Has been on 60 mg an hour of amiodarone  till this morning.  Vital Signs    Vitals:   01/25/24 1720 01/25/24 1923 01/25/24 2331 01/26/24 0330  BP: 101/85 (!) 88/72 94/83 110/85  Pulse:  (!) 107 (!) 102 99  Resp: 20 18 20 18   Temp: 98.6 F (37 C) 97.8 F (36.6 C) 98 F (36.7 C) 98.3 F (36.8 C)  TempSrc: Oral Oral Oral Oral  SpO2: 95% 98% 93% 99%  Weight:    84.1 kg  Height:        Intake/Output Summary (Last 24 hours) at 01/26/2024 0757 Last data filed at 01/26/2024 0700 Gross per 24 hour  Intake 1845.21 ml  Output 700 ml  Net 1145.21 ml      01/26/2024    3:30 AM 01/25/2024    4:11 AM 01/24/2024    1:30 PM  Last 3 Weights  Weight (lbs) 185 lb 6.5 oz 184 lb 14.4 oz 185 lb 4.8 oz  Weight (kg) 84.1 kg 83.87 kg 84.052 kg      Telemetry    Nonsustained ventricular tachycardia around midnight last night.  Couplets and triplets at other times.- Personally Reviewed  ECG    Personally Reviewed  Physical Exam   GEN: No acute distress.   Cardiac: RRR, no murmurs, rubs, or gallops.  Respiratory: Clear to auscultation bilaterally. Psych: Normal affect   Assessment & Plan    #Chronic systolic heart failure #Nonischemic cardiomyopathy #Ventricular tachycardia #ICD shocks The patient presented with ventricular tachycardia and was started on IV amiodarone .  He has been maintained on 60 mg an hour of Amio until this morning.  For now, given last night's nonsustained ventricular tachycardia, I will decrease the dose to 30 mg an hour and monitor for an additional 24 hours.  If he remains quiescent, transition to oral on Sunday with possible discharge Monday morning. -Replete potassium this morning  Continue Jardiance , eplerenone  Hypotension has limited addition of GDMT in the past  #HIV Continue  antiretroviral therapy     Ole T. Cindie, MD, [facility], [facility] Cardiac Electrophysiology

## 2024-01-26 NOTE — Plan of Care (Signed)
  Problem: Education: Goal: Knowledge of General Education information will improve Description: Including pain rating scale, medication(s)/side effects and non-pharmacologic comfort measures Outcome: Progressing   Problem: Health Behavior/Discharge Planning: Goal: Ability to manage health-related needs will improve Outcome: Progressing   Problem: Clinical Measurements: Goal: Will remain free from infection Outcome: Completed/Met   Problem: Nutrition: Goal: Adequate nutrition will be maintained Outcome: Progressing   Problem: Pain Managment: Goal: General experience of comfort will improve and/or be controlled Outcome: Progressing   Problem: Safety: Goal: Ability to remain free from injury will improve Outcome: Progressing   Problem: Skin Integrity: Goal: Risk for impaired skin integrity will decrease Outcome: Progressing

## 2024-01-27 DIAGNOSIS — Z515 Encounter for palliative care: Secondary | ICD-10-CM

## 2024-01-27 DIAGNOSIS — Z7189 Other specified counseling: Secondary | ICD-10-CM

## 2024-01-27 DIAGNOSIS — I472 Ventricular tachycardia, unspecified: Secondary | ICD-10-CM | POA: Diagnosis not present

## 2024-01-27 LAB — BASIC METABOLIC PANEL
Anion gap: 10 (ref 5–15)
BUN: 13 mg/dL (ref 8–23)
CO2: 24 mmol/L (ref 22–32)
Calcium: 8.6 mg/dL — ABNORMAL LOW (ref 8.9–10.3)
Chloride: 105 mmol/L (ref 98–111)
Creatinine, Ser: 1.74 mg/dL — ABNORMAL HIGH (ref 0.61–1.24)
GFR, Estimated: 44 mL/min — ABNORMAL LOW (ref >60)
Glucose, Bld: 119 mg/dL — ABNORMAL HIGH (ref 70–99)
Potassium: 4.2 mmol/L (ref 3.5–5.1)
Sodium: 139 mmol/L (ref 135–145)

## 2024-01-27 LAB — MAGNESIUM: Magnesium: 2 mg/dL (ref 1.7–2.4)

## 2024-01-27 MED ORDER — AMIODARONE HCL 200 MG PO TABS
400.0000 mg | ORAL_TABLET | Freq: Two times a day (BID) | ORAL | Status: DC
  Administered 2024-01-27 – 2024-01-30 (×7): 400 mg via ORAL
  Filled 2024-01-27 (×7): qty 2

## 2024-01-27 NOTE — Progress Notes (Signed)
 Ryan Kindler, MD notified of patient having 3 occurences of 8 to 9 beat runs of non sustained V-tach recently.  No new orders as patient is pursing Hospice.

## 2024-01-27 NOTE — Progress Notes (Signed)
 Pt QTC noted to be > 600 on telemetry.   Pt asymptomatic.  Mg 2.0, K 4.2. VSS.  Cards notified with no new orders.

## 2024-01-27 NOTE — Plan of Care (Signed)
  Problem: Education: Goal: Knowledge of General Education information will improve Description: Including pain rating scale, medication(s)/side effects and non-pharmacologic comfort measures Outcome: Progressing   Problem: Health Behavior/Discharge Planning: Goal: Ability to manage health-related needs will improve Outcome: Progressing   Problem: Clinical Measurements: Goal: Ability to maintain clinical measurements within normal limits will improve Outcome: Progressing Goal: Diagnostic test results will improve Outcome: Progressing Goal: Respiratory complications will improve Outcome: Progressing Goal: Cardiovascular complication will be avoided Outcome: Progressing   Problem: Activity: Goal: Risk for activity intolerance will decrease Outcome: Progressing   Problem: Nutrition: Goal: Adequate nutrition will be maintained Outcome: Progressing   Problem: Coping: Goal: Level of anxiety will decrease Outcome: Progressing   Problem: Elimination: Goal: Will not experience complications related to bowel motility Outcome: Progressing Goal: Will not experience complications related to urinary retention Outcome: Progressing   Problem: Pain Managment: Goal: General experience of comfort will improve and/or be controlled Outcome: Progressing   Problem: Safety: Goal: Ability to remain free from injury will improve Outcome: Progressing   Problem: Skin Integrity: Goal: Risk for impaired skin integrity will decrease Outcome: Progressing   Problem: Education: Goal: Knowledge of disease or condition will improve Outcome: Progressing Goal: Understanding of medication regimen will improve Outcome: Progressing Goal: Individualized Educational Video(s) Outcome: Progressing   Problem: Activity: Goal: Ability to tolerate increased activity will improve Outcome: Progressing   Problem: Cardiac: Goal: Ability to achieve and maintain adequate cardiopulmonary perfusion will  improve Outcome: Progressing   Problem: Health Behavior/Discharge Planning: Goal: Ability to safely manage health-related needs after discharge will improve Outcome: Progressing

## 2024-01-27 NOTE — Consult Note (Signed)
 Palliative Medicine Inpatient Consult Note  Consulting Provider:  Colletta Manuelita Nat DEVONNA   Reason for consult:   Palliative Care Consult Services Palliative Medicine Consult  Reason for Consult? end stage heart failure, hospice   01/27/2024  HPI:  Per intake H&P --> Ryan Long presents today for evaluation of worsening VT. He is a pleasant 63 year old man with a history of hypertension, depression, chronic systolic heart failure, HIV disease, and a history of polysubstance abuse. Palliative care has been asked to get involved in the setting of end stage heart failure.  Clinical Assessment/Goals of Care:  *Please note that this is a verbal dictation therefore any spelling or grammatical errors are due to the Dragon Medical One system interpretation.  I have reviewed medical records including EPIC notes, labs and imaging, received report from bedside RN, assessed the patient who is lying in bed in NAD.    I met with Ryan Long to further discuss diagnosis prognosis, GOC, EOL wishes, disposition and options.   I introduced Palliative Medicine as specialized medical care for people living with serious illness. It focuses on providing relief from the symptoms and stress of a serious illness. The goal is to improve quality of life for both the patient and the family.  Medical History Review and Understanding:  A review of Ryan Long's past medical history inclusive of hypertension, depression, heart failure, HIV, and polysubstance abuse was held.  Social History:  Ryan Long shares that he is originally from [place] .  He moved to [place]  in 1995 shortly after his HIV diagnosis as he was expelled from his family.  He shares he has never been married or had children.  He states he has a brother in [place]  though he struggles with substance abuse therefore they have lost touch.  Ryan Long shares his father has died and his mother and he are not in contact. 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  He is a man of the [employer].  Functional and Nutritional State:  Preceding hospitalization Kennon was living at the [facility].  He shares he rents a room there for $800 a month.  He was able to complete all BADLs and IADLs on his own.  He has a scooter which he utilizes to get around.  He does state in the setting of his disease that his shortness of breath has become more profound thus causing him to utilize his scooter more often.  Advance Directives:  A detailed discussion was had today regarding advanced directives. Mikhai shares he has no advanced directives and he does not have anyone he can rely on at this juncture to be his surrogate decision maker.  Code Status:  Concepts specific to code status, artifical feeding and hydration, continued IV antibiotics and rehospitalization was had.  The difference between a aggressive medical intervention path  and a palliative comfort care path for this patient at this time was had.   Ryan Long is an established DO NOT INTUBATE DO NOT RESUSCITATE CODE STATUS.  Discussion:  Ryan Long and I discussed his advanced disease and the progressive nature of the disease despite present medical interventions.  We reviewed that Ryan Long has already experienced the unfortunate progression of the disease given his profound shortness of breath with movement.  Ryan Long does note that this is improved since hospitalization and he is overall feeling much better.  We discussed long-term goals in the setting of his heart failure.  I gently introduce the topic of hospice. I  described hospice as a service for patients who have a life expectancy of 6 months or less.  The goal of hospice is the preservation of dignity and quality at the end phases of life. Under hospice care, the focus changes from curative to symptom relief.  Ryan Long feels this is more appropriate for him at this juncture.  AICD deactivation will occur if patient  determines he would like to formally sign-on with hospice.  I would like to have him meet with [facility] [place] per his choice to verify this is the path he would like to pursue and move forward from there.  Discussed the importance of continued conversation with family and their  medical providers regarding overall plan of care and treatment options, ensuring decisions are within the context of the patients values and GOCs.  Decision Maker: Ryan Long is his own decision maker he cannot identify any when he would trust enough to make decisions on his behalf.  SUMMARY OF RECOMMENDATIONS   DNAR/DNI  Patient is aware of the progressive nature of his disease and that he is at end-stage with limited long-term prognosis.  Ryan Long is agreeable to meeting with [facility] [place] to learn more about their services and potentially enroll.  Should very determine he would like to pursue hospice deactivation of his AICD will be pursued.  Ongoing palliative care support  Code Status/Advance Care Planning: DNAR/DNI  Palliative Prophylaxis:  Aspiration, Bowel Regimen, Delirium Protocol, Frequent Pain Assessment, Oral Care, Palliative Wound Care, and Turn Reposition  Additional Recommendations (Limitations, Scope, Preferences): Continue current care  Psycho-social/Spiritual:  Desire for further Chaplaincy support: Yes-[facility] Additional Recommendations: Education on end-stage heart failure   Prognosis: Extremely limited, even more so once AICD is deactivated. Hospice is an appropriate consideration.   Discharge Planning: To be determined  Vitals:   01/27/24 1300 01/27/24 1325  BP:  91/71  Pulse: 92 87  Resp:    Temp:    SpO2: 96% 100%    Intake/Output Summary (Last 24 hours) at 01/27/2024 1403 Last data filed at 01/27/2024 9075 Gross per 24 hour  Intake 933.9 ml  Output 900 ml  Net 33.9 ml   Last Weight  Most recent update: 01/27/2024  5:26 AM    Weight  84.5 kg (186 lb 4.6 oz)             Gen: Older African-American male in no acute distress HEENT: moist mucous membranes CV: Regular rate and rhythm PULM: On 2 L nasal cannula breathing is even and nonlabored ABD: soft/nontender EXT: No edema Neuro: Alert and oriented x3  PPS: 40%   This conversation/these recommendations were discussed with patient primary care team, Dr. Cindie  Billing based on MDM: High ______________________________________________________ Rosaline Becton [facility] Palliative Medicine Team Team Cell Phone: [phone] Please utilize secure chat with additional questions, if there is no response within 30 minutes please call the above phone number  Palliative Medicine Team providers are available by phone from 7am to 7pm daily and can be reached through the team cell phone.  Should this patient require assistance outside of these hours, please call the patient's attending physician.

## 2024-01-27 NOTE — Plan of Care (Signed)
  Problem: Clinical Measurements: Goal: Ability to maintain clinical measurements within normal limits will improve Outcome: Progressing   Problem: Nutrition: Goal: Adequate nutrition will be maintained Outcome: Not Progressing   Problem: Coping: Goal: Level of anxiety will decrease Outcome: Progressing   Problem: Elimination: Goal: Will not experience complications related to bowel motility Outcome: Not Progressing Goal: Will not experience complications related to urinary retention Outcome: Progressing   Problem: Pain Managment: Goal: General experience of comfort will improve and/or be controlled Outcome: Progressing   Problem: Safety: Goal: Ability to remain free from injury will improve Outcome: Progressing

## 2024-01-27 NOTE — Progress Notes (Signed)
   Rounding Note    Patient Name: Ryan Long Date of Encounter: 01/27/2024  [facility] Cardiologist: None   Subjective   No acute events overnight.   No sustained arrhythmias overnight.  Vital Signs    Vitals:   01/26/24 1935 01/26/24 2341 01/27/24 0524 01/27/24 0754  BP: 97/74 91/78 92/82  94/74  Pulse: (!) 102 (!) 104 97 92  Resp: 20 18 20 18   Temp: 97.9 F (36.6 C) 97.7 F (36.5 C) (!) 97.5 F (36.4 C) 97.6 F (36.4 C)  TempSrc: Oral Oral Oral Oral  SpO2: 98% 98% 99% 96%  Weight:   84.5 kg   Height:        Intake/Output Summary (Last 24 hours) at 01/27/2024 0803 Last data filed at 01/27/2024 9386 Gross per 24 hour  Intake 240 ml  Output 1150 ml  Net -910 ml      01/27/2024    5:24 AM 01/26/2024    3:30 AM 01/25/2024    4:11 AM  Last 3 Weights  Weight (lbs) 186 lb 4.6 oz 185 lb 6.5 oz 184 lb 14.4 oz  Weight (kg) 84.5 kg 84.1 kg 83.87 kg      Telemetry    Nonsustained ventricular tachycardia lasting 13 beats - Personally Reviewed  ECG    Personally Reviewed  Physical Exam   GEN: No acute distress.   Cardiac: RRR, no murmurs, rubs, or gallops.  Respiratory: Clear to auscultation bilaterally. Psych: Normal affect   Assessment & Plan    #Chronic systolic heart failure #Nonischemic cardiomyopathy #Ventricular tachycardia #ICD shocks Improved. Transition to oral amiodarone  today. Labs pending.   Continue Jardiance , eplerenone  Hypotension has limited addition of GDMT in the past  #HIV Continue antiretroviral therapy     Ole T. Cindie, MD, [facility], [facility] Cardiac Electrophysiology

## 2024-01-27 NOTE — Plan of Care (Signed)
  Problem: Clinical Measurements: Goal: Ability to maintain clinical measurements within normal limits will improve Outcome: Progressing   Problem: Nutrition: Goal: Adequate nutrition will be maintained 01/27/2024 0802 by Alroy Powell ORN, RN Outcome: Not Progressing 01/27/2024 0801 by Alroy Powell ORN, RN Outcome: Not Progressing   Problem: Coping: Goal: Level of anxiety will decrease Outcome: Progressing   Problem: Elimination: Goal: Will not experience complications related to bowel motility 01/27/2024 0802 by Alroy Powell ORN, RN Outcome: Not Progressing 01/27/2024 0801 by Alroy Powell ORN, RN Outcome: Not Progressing Goal: Will not experience complications related to urinary retention 01/27/2024 0802 by Alroy Powell ORN, RN Outcome: Progressing 01/27/2024 0801 by Alroy Powell ORN, RN Outcome: Progressing   Problem: Pain Managment: Goal: General experience of comfort will improve and/or be controlled 01/27/2024 0802 by Alroy Powell ORN, RN Outcome: Progressing 01/27/2024 0801 by Alroy Powell ORN, RN Outcome: Progressing   Problem: Safety: Goal: Ability to remain free from injury will improve 01/27/2024 0802 by Alroy Powell ORN, RN Outcome: Progressing 01/27/2024 0801 by Alroy Powell ORN, RN Outcome: Progressing

## 2024-01-27 NOTE — TOC Progression Note (Signed)
 Transition of Care [facility]) - Progression Note    Patient Details  Name: Ryan Long MRN: [id number] Date of Birth: January 31, 1961  Transition [facility]) CM/SW Contact  Robynn Eileen Hoose, RN Phone Number: 01/27/2024, 2:07 PM  Clinical Narrative:  Secure chat received from palliative medicine that patient would like to pursue [facility] [place]. Referral sent to [facility] with [facility] [place].        Barriers to Discharge: Continued Medical Work up  Expected Discharge Plan and Services       Living arrangements for the past 2 months: Boarding House                                       Social Determinants of Health (SDOH) Interventions SDOH Screenings   Food Insecurity: No Food Insecurity (01/24/2024)  Housing: Low Risk  (01/24/2024)  Recent Concern: Housing - Medium Risk (10/26/2023)   Received from [facility]  Transportation Needs: No Transportation Needs (01/24/2024)  Utilities: Not At Risk (01/24/2024)  Alcohol  Screen: Low Risk  (07/17/2023)  Depression (PHQ2-9): Low Risk  (08/01/2023)  Financial Resource Strain: Low Risk  (07/17/2023)  Physical Activity: Insufficiently Active (07/17/2023)  Social Connections: Moderately Integrated (07/17/2023)  Stress: Stress Concern Present (07/17/2023)  Tobacco Use: High Risk (01/24/2024)  Health Literacy: Adequate Health Literacy (07/18/2023)    Readmission Risk Interventions     No data to display

## 2024-01-28 DIAGNOSIS — Z7189 Other specified counseling: Secondary | ICD-10-CM | POA: Diagnosis not present

## 2024-01-28 DIAGNOSIS — Z515 Encounter for palliative care: Secondary | ICD-10-CM | POA: Diagnosis not present

## 2024-01-28 DIAGNOSIS — I472 Ventricular tachycardia, unspecified: Secondary | ICD-10-CM | POA: Diagnosis not present

## 2024-01-28 LAB — BASIC METABOLIC PANEL
Anion gap: 10 (ref 5–15)
BUN: 16 mg/dL (ref 8–23)
CO2: 23 mmol/L (ref 22–32)
Calcium: 8.3 mg/dL — ABNORMAL LOW (ref 8.9–10.3)
Chloride: 104 mmol/L (ref 98–111)
Creatinine, Ser: 1.57 mg/dL — ABNORMAL HIGH (ref 0.61–1.24)
GFR, Estimated: 50 mL/min — ABNORMAL LOW (ref >60)
Glucose, Bld: 86 mg/dL (ref 70–99)
Potassium: 3.4 mmol/L — ABNORMAL LOW (ref 3.5–5.1)
Sodium: 137 mmol/L (ref 135–145)

## 2024-01-28 LAB — MAGNESIUM: Magnesium: 2 mg/dL (ref 1.7–2.4)

## 2024-01-28 MED ORDER — POTASSIUM CHLORIDE CRYS ER 20 MEQ PO TBCR
40.0000 meq | EXTENDED_RELEASE_TABLET | Freq: Once | ORAL | Status: AC
Start: 2024-01-28 — End: 2024-01-28
  Administered 2024-01-28: 40 meq via ORAL
  Filled 2024-01-28: qty 2

## 2024-01-28 NOTE — TOC Progression Note (Addendum)
 Transition of Care [facility]) - Progression Note    Patient Details  Name: Ryan Long MRN: [id number] Date of Birth: [birth year]  Transition of Care [facility]) CM/SW Contact  Ernst Heap Phone Number: [phone] 01/28/2024, 11:56 AM  Clinical Narrative: HF CSW met with pt at bedside. Pt inquired about housing. CSW informed the pt that he needs to review housing list and call DSS to see if one of the disability case managers can provide assistance with the housing process.   3:22 PM- Per HF NCM, "They will be following him for Hospice. He can go with SNF LTC, without needing PT evaluation. if his Medicaid is set up for LTC. or he will need PT evaluation for coverage under his [facility] Medicare."  CSW called and confirmed and spoke with pt over the phone. Pt stated that he is interested in SNF and not hospice. CSW explained the SNF process. Pt agrees to be faxed out. CSW will fax out and complete FL2.   TOC will continue following.         Barriers to Discharge: Continued Medical Work up  Expected Discharge Plan and Services       Living arrangements for the past 2 months: Boarding House                                       Social Determinants of Health (SDOH) Interventions SDOH Screenings   Food Insecurity: No Food Insecurity (01/24/2024)  Housing: Low Risk  (01/24/2024)  Recent Concern: Housing - Medium Risk (10/26/2023)   Received from [facility]  Transportation Needs: No Transportation Needs (01/24/2024)  Utilities: Not At Risk (01/24/2024)  Alcohol  Screen: Low Risk  (07/17/2023)  Depression (PHQ2-9): Low Risk  (08/01/2023)  Financial Resource Strain: Low Risk  (07/17/2023)  Physical Activity: Insufficiently Active (07/17/2023)  Social Connections: Moderately Integrated (07/17/2023)  Stress: Stress Concern Present (07/17/2023)  Tobacco Use: High Risk (01/24/2024)  Health Literacy: Adequate Health Literacy (07/18/2023)    Readmission Risk Interventions     No  data to display

## 2024-01-28 NOTE — Progress Notes (Signed)
   Met with the pt to discuss hospice services. He is awake and able to participate in the conversation. He expresses he has no desire to go back to his "Recovery home" and would like for assistance with somewhere else to go. I did discuss with him the Hospice Facility in [place]. He is not in agreement with going to this facility. (Therefore I did not speak with our MD about if he is appropriate for inpt. Facility). He reports he has medicaid and inquires about a nursing home. I have spoken to him about what hospice care would look like at a SNF and let him know I am unsure if he would be a candidate. I let him know I would update TOC about this so they can possibly discuss further with him since I am not as familiar with the criteria for enrollment into SNF. We did discuss goals and deactivating his ICD since the focus would be comfort and allowing a natural peaceful passing. He is in agreement with this being done. I will continue to follow him to assist with hospice care at the determined disposition at d/c.   Lyla Samuels RN [phone]

## 2024-01-28 NOTE — TOC Progression Note (Signed)
 Transition of Care [facility]) - Progression Note    Patient Details  Name: Ryan Long MRN: [id number] Date of Birth: Sep 06, 1961  [facility]) CM/SW Contact  Benjiman Bras, RN Phone Number: [phone] 01/28/2024, 2:23 PM  Clinical Narrative:    Received message from [facility] rep, Cheri, and they will follow for Home Hospice at facility. Pt has Medicaid but payor has to be set up for SNF LTC benefits. If pt goes with his [facility] Medicare for SNF rehab, he will not be able to use both SNF rehab and Home Hospice benefits. Will need a TOC LOG for SNF LTC until his Medicaid is switched to LTC.   Message sent to attending for PT/OT evaluation for SNF rehab. HF TOC CSW will follow up for SNF placement.      Barriers to Discharge: Continued Medical Work up  Expected Discharge Plan and Services       Living arrangements for the past 2 months: Boarding House                                       Social Determinants of Health (SDOH) Interventions SDOH Screenings   Food Insecurity: No Food Insecurity (01/24/2024)  Housing: Low Risk  (01/24/2024)  Recent Concern: Housing - Medium Risk (10/26/2023)   Received from [facility]  Transportation Needs: No Transportation Needs (01/24/2024)  Utilities: Not At Risk (01/24/2024)  Alcohol  Screen: Low Risk  (07/17/2023)  Depression (PHQ2-9): Low Risk  (08/01/2023)  Financial Resource Strain: Low Risk  (07/17/2023)  Physical Activity: Insufficiently Active (07/17/2023)  Social Connections: Moderately Integrated (07/17/2023)  Stress: Stress Concern Present (07/17/2023)  Tobacco Use: High Risk (01/24/2024)  Health Literacy: Adequate Health Literacy (07/18/2023)    Readmission Risk Interventions     No data to display

## 2024-01-28 NOTE — Progress Notes (Signed)
   Palliative Medicine Inpatient Follow Up Note HPI: Mr. Epting presents today for evaluation of worsening VT. He is a pleasant 63 year old man with a history of hypertension, depression, chronic systolic heart failure, HIV disease, and a history of polysubstance abuse. Palliative care has been asked to get involved in the setting of end stage heart failure.   Today's Discussion 01/28/2024  *Please note that this is a verbal dictation therefore any spelling or grammatical errors are due to the "[employer]agon [employer] One" system interpretation.  Chart reviewed inclusive of vital signs, progress notes, laboratory results, and diagnostic images.   A review of patients present clinical condition was held. Jakeb is understanding about the reality that he is at the end stages of his disease. He notes how hard ambulation is for him in the setting of severe symptoms.   Created space and opportunity for patient to explore thoughts feelings and fears regarding current medical situation. He shares that he wishes his circumstances were difference though he realizes where his body is.   Discussed conversation with hospice liaison, Cheri. Corneilus is agreeable to hospice service though needs support to determine the safest location for himself.   Quay agree's to AICD deactivation this afternoon.   Questions and concerns addressed/Palliative Support Provided.   Objective Assessment: Vital Signs Vitals:   01/28/24 0759 01/28/24 1135  BP: 93/64 90/73  Pulse: 100 96  Resp: 18 18  Temp: 98.3 F (36.8 C) 98 F (36.7 C)  SpO2: 98% 98%    Intake/Output Summary (Last 24 hours) at 01/28/2024 1321 Last data filed at 01/28/2024 1010 Gross per 24 hour  Intake 480 ml  Output --  Net 480 ml   Last Weight  Most recent update: 01/28/2024  4:44 AM    Weight  84.4 kg (186 lb 1.6 oz)            Gen: Older African-American male in no acute distress HEENT: moist mucous membranes CV: Regular rate and rhythm PULM:  On RA, breathing is even and nonlabored ABD: soft/nontender EXT: No edema Neuro: Alert and oriented x3  SUMMARY OF RECOMMENDATIONS   DNAR/DNI  AICD will be deactivated this afternoon  Plan for placement - coordination with TOC and Hospice  Patient agreeable to hospice on discharge  Appreciate chaplain support  PMT will continue to follow along  MDM: High ______________________________________________________________________________________ [facility] [place] Palliative Medicine Team Team Cell Phone: [phone] Please utilize secure chat with additional questions, if there is no response within 30 minutes please call the above phone number  Palliative Medicine Team providers are available by phone from 7am to 7pm daily and can be reached through the team cell phone.  Should this patient require assistance outside of these hours, please call the patient's attending physician.

## 2024-01-28 NOTE — Plan of Care (Signed)
  Problem: Education: Goal: Knowledge of General Education information will improve Description: Including pain rating scale, medication(s)/side effects and non-pharmacologic comfort measures Outcome: Progressing   Problem: Health Behavior/Discharge Planning: Goal: Ability to manage health-related needs will improve Outcome: Progressing   Problem: Clinical Measurements: Goal: Ability to maintain clinical measurements within normal limits will improve Outcome: Progressing Goal: Diagnostic test results will improve Outcome: Progressing Goal: Respiratory complications will improve Outcome: Progressing Goal: Cardiovascular complication will be avoided Outcome: Progressing   Problem: Activity: Goal: Risk for activity intolerance will decrease Outcome: Progressing   Problem: Nutrition: Goal: Adequate nutrition will be maintained Outcome: Progressing   Problem: Coping: Goal: Level of anxiety will decrease Outcome: Progressing   Problem: Elimination: Goal: Will not experience complications related to bowel motility Outcome: Progressing Goal: Will not experience complications related to urinary retention Outcome: Progressing   Problem: Pain Managment: Goal: General experience of comfort will improve and/or be controlled Outcome: Progressing   Problem: Safety: Goal: Ability to remain free from injury will improve Outcome: Progressing   Problem: Skin Integrity: Goal: Risk for impaired skin integrity will decrease Outcome: Progressing   Problem: Education: Goal: Knowledge of disease or condition will improve Outcome: Progressing Goal: Understanding of medication regimen will improve Outcome: Progressing Goal: Individualized Educational Video(s) Outcome: Progressing   Problem: Activity: Goal: Ability to tolerate increased activity will improve Outcome: Progressing   Problem: Cardiac: Goal: Ability to achieve and maintain adequate cardiopulmonary perfusion will  improve Outcome: Progressing   Problem: Health Behavior/Discharge Planning: Goal: Ability to safely manage health-related needs after discharge will improve Outcome: Progressing

## 2024-01-28 NOTE — Progress Notes (Addendum)
 Rounding Note    Patient Name: Ryan Long Date of Encounter: 01/28/2024  [facility] Cardiologist: previously Dr. Jearldine Mina AHF: Dr. Julane Ny EP:   Subjective   Reports yesterday was a good day, OOB, ambulated in the halls, busy in his room, remains a bit nauseous  Inpatient Medications    Scheduled Meds:  amiodarone   400 mg Oral BID   aspirin  EC  81 mg Oral Daily   bictegravir-emtricitabine -tenofovir  AF  1 tablet Oral Daily   empagliflozin   10 mg Oral QAC breakfast   eplerenone   12.5 mg Oral Daily   finasteride   5 mg Oral Daily   fluticasone  furoate-vilanterol  1 puff Inhalation Daily   furosemide   40 mg Oral Daily   heparin   5,000 Units Subcutaneous Q8H   nicotine   14 mg Transdermal Q24H   tamsulosin   0.4 mg Oral BID   valACYclovir   500 mg Oral Daily   Continuous Infusions:  magnesium  sulfate bolus IVPB Stopped (01/25/24 1737)   PRN Meds: acetaminophen , albuterol , LORazepam , magnesium  hydroxide, morphine  injection, ondansetron  (ZOFRAN ) IV   Vital Signs    Vitals:   01/27/24 1730 01/27/24 1800 01/27/24 1927 01/28/24 0438  BP:   92/70 99/78  Pulse: 90 87  (!) 101  Resp:   20 16  Temp:   97.7 F (36.5 C) (!) 97.5 F (36.4 C)  TempSrc:   Oral Oral  SpO2: 99% 100%  97%  Weight:    84.4 kg  Height:        Intake/Output Summary (Last 24 hours) at 01/28/2024 0706 Last data filed at 01/27/2024 1945 Gross per 24 hour  Intake 813.9 ml  Output --  Net 813.9 ml      01/28/2024    4:38 AM 01/27/2024    5:24 AM 01/26/2024    3:30 AM  Last 3 Weights  Weight (lbs) 186 lb 1.6 oz 186 lb 4.6 oz 185 lb 6.5 oz  Weight (kg) 84.414 kg 84.5 kg 84.1 kg      Telemetry    SR/ST, mostly 90's, occ to at times frequent PVCs, couplets, rare NSVT, no sustained arrhythmia  - Personally Reviewed  ECG    No new EKGs  - Personally Reviewed  Physical Exam   Exam is stable GEN: No acute distress.   Neck: No JVD Cardiac: RRR, no murmurs, rubs, or gallops.   Respiratory: CTA b/l. GI: Soft, nontender, non-distended  MS: No edema; No deformity. Neuro:  Nonfocal  Psych: Normal affect   Labs    High Sensitivity Troponin:  No results for input(s): "TROPONINIHS" in the last 720 hours.   Chemistry Recent Labs  Lab 01/25/24 0346 01/26/24 0647 01/27/24 0819 01/28/24 0409  NA 142 138 139 137  K 3.7 3.4* 4.2 3.4*  CL 107 103 105 104  CO2 24 22 24 23   GLUCOSE 123* 134* 119* 86  BUN 12 17 13 16   CREATININE 1.55* 1.88* 1.74* 1.57*  CALCIUM  8.4* 8.6* 8.6* 8.3*  MG 2.0 2.0 2.0 2.0  PROT 5.4*  --   --   --   ALBUMIN  2.7*  --   --   --   AST 38  --   --   --   ALT 44  --   --   --   ALKPHOS 58  --   --   --   BILITOT 1.5*  --   --   --   GFRNONAA 50* 40* 44* 50*  ANIONGAP 11 13 10  10    Lipids No results for input(s): "CHOL", "TRIG", "HDL", "LABVLDL", "LDLCALC", "CHOLHDL" in the last 168 hours.  Hematology Recent Labs  Lab 01/24/24 0434 01/25/24 0346  WBC 3.3* 3.4*  RBC 5.41 5.33  HGB 18.2* 17.8*  HCT 50.9 49.6  MCV 94.1 93.1  MCH 33.6 33.4  MCHC 35.8 35.9  RDW 13.3 13.2  PLT 197 176   Thyroid  No results for input(s): "TSH", "FREET4" in the last 168 hours.  BNPNo results for input(s): "BNP", "PROBNP" in the last 168 hours.  DDimer No results for input(s): "DDIMER" in the last 168 hours.   Radiology    No results found.   Cardiac Studies    01/03/2024: R/LHC Findings: Ao =88/68 (78) LV = 86/26 RA = 10 RV = 45/13 PA = 40/28 (34) PCW = 24 Thermo CO/CI = 3.8/1.9 Fick CO/CI = 3.2/1.6 PVR = 3.1 (Fick) 2.7 (TD) Ao sat = 95% PA sat = 64%, 60% PAPi = 1.2   Assessment: 1. Normal coronaries 2. Severe NICM with severe biventricular dysfunction LVEF 25% 3. Elevated filling pressures with low cardiac output   Plan/Discussion:    Admit for HF optimization and assessment for candidacy for advanced therapies.    09/28/23: TTE 1. Left ventricular ejection fraction, by estimation, is 20 to 25%. The  left ventricle has  severely decreased function. The left ventricle  demonstrates global hypokinesis. The left ventricular internal cavity size  was mildly to moderately dilated. Left  ventricular diastolic parameters are consistent with Grade I diastolic  dysfunction (impaired relaxation).   2. Right ventricular systolic function is normal. The right ventricular  size is normal. Tricuspid regurgitation signal is inadequate for assessing  PA pressure.   3. The mitral valve is normal in structure. Mild mitral valve  regurgitation.   4. The aortic valve is normal in structure. Aortic valve regurgitation is  not visualized.   5. The inferior vena cava is normal in size with greater than 50%  respiratory variability, suggesting right atrial pressure of 3 mmHg.      09/27/2021: TTE  1. Left ventricular ejection fraction, by estimation, is 20 to 25%. The  left ventricle has severely decreased function. The left ventricle  demonstrates global hypokinesis. The left ventricular internal cavity size  was mildly to moderately dilated. Left  ventricular diastolic parameters are consistent with Grade I diastolic  dysfunction (impaired relaxation).   2. Right ventricular systolic function is normal. The right ventricular  size is normal. Tricuspid regurgitation signal is inadequate for assessing  PA pressure.   3. The mitral valve is normal in structure. Mild mitral valve  regurgitation.   4. The aortic valve is normal in structure. Aortic valve regurgitation is  not visualized.   5. The inferior vena cava is normal in size with greater than 50%  respiratory variability, suggesting right atrial pressure of 3 mmHg.   Comparison(s): No significant change from prior study.      12/13/2021 TTE SUMMARY  CPS 02-26-14 EF has declined.  Echo contrast study was not performed due to the patient refusal.  Ectopy during study.  There is mild concentric left ventricular hypertrophy.  The left ventricular size is normal.   LV ejection fraction = 25-30%.  Left ventricular systolic function is severely reduced.  Left ventricular filling pattern is indeterminate.  There is severe global hypokinesis of the left ventricle.  Device lead in the right ventricle  The right ventricle is mildly dilated.  The left atrium is mildly  dilated.  The right atrium is mildly dilated.  There is mild mitral regurgitation.  There was insufficient TR detected to calculate RV systolic pressure.  There is a pacemaker lead in the right ventricle.   Last Stress Test  01/04/2022 IMPRESSION:  1. Images are consistent with dilated cardiomyopathy with a fixed  inferior wall defect and no significant reversibility. Of interest is the  fact that there is significant reverse redistribution of the septal wall  and anterior wall which is of unknown clinical significance   2. Calculated ejection fraction 17%   3. Dilated cardiomyopathy with an ejection fraction of 17% calculated and  a fixed defect of the inferior wall without significant reversibility.   Patient Profile     63 y.o. male h/o NICM, chronic sCHF s/p single chamber Abbott ICD, HLD, HIV, substance abuse (ETOH, THC, Cocaine), CKD II & polycythemia  Known hx of VT Long hx of noncompliance complicating his care Last seen in our clinic by myself 11/02/23, lengthy visit, a lot of symptoms, had VT in Oct Discussed meds at length Plan of care with in clinic MD Afterwards reported very unhappy with his care and cancelled all future visits with our team  Admitted 01/03/24 > HF exacerbation > R/LHC, planned for admission/HF magement, left AMA  not felt to be a candidate for destination therapy. He has a history of ongoing tobacco use and noncompliance   ER visit 01/16/24 for ICD shock > left AMA angry his room didn't have a TV  Admitted this stay feeling poorly > multiple VT episodes, therapies, had stopped his amiodarone  Not felt to be overtly volume OL Started on IV  amio Device interrogation revealed episodes of monomorphic VT in the 180s. Faster VT's have since been successfully treated with ATP previously.  Added a zone with ATP starting in the 170s.   Device History: Abbott Single Chamber ICD implanted 01/08/2018    + appropriate tx   AAD hx amiodarone , started 03/2023 at [facility]    Assessment & Plan    VT Known for him No sustained VT since 01/24/24 (had ATP then as well) Transitioned to PO amiodarone  yesterday 01/27/24 (400mg  BID) Occ PVCs, couplets, rare NSVT 8 and 10 beats  Off BB with concerns of low output and his hypotension K+ 3.4 > replace Mag 2.0  QT looks OK by tele measurement   NICM Chronic CHF He does not appear volume OL Appreciate HF team input End stage NICM Not a candidate for advanced therapies Palliative care on board > referral to [facility] DNR/DNI ICD remains on for now at least, pending final decision +/- Hospice care  Creat about baseline Uncertain I/O accuracy Continue jardiance  Eplerenone , Lasix     HIV Home meds   For questions or updates, please contact [facility] Please consult [email or web address] for contact info under        Signed, Debbie Fails, PA-C  01/28/2024, 7:06 AM    I have seen and examined this patient with Mertha Abrahams.  Agree with above, note added to reflect my findings.  Feeling well without acute complaint.  No further arrhythmias overnight.  GEN: Well nourished, well developed, in no acute distress  HEENT: normal  Neck: no JVD, carotid bruits, or masses Cardiac: RRR; no murmurs, rubs, or gallops,no edema  Respiratory:  clear to auscultation bilaterally, normal work of breathing GI: soft, nontender, nondistended, + BS MS: no deformity or atrophy  Skin: warm and dry, device site well healed Neuro:  Strength and sensation are intact Psych: euthymic mood, full affect   VT storm: Has been transition to p.o. amiodarone .  Yazmen Briones continue at 400 mg twice  daily.  He does have nausea with amiodarone .  He Dezyre Hoefer take amiodarone  with food, may need to treat with Zofran  as well.  If he has further arrhythmia, mexiletine or quinidine may be options. Chronic systolic heart failure: Due to nonischemic cardiomyopathy.  Is at end-stage.  Is discussing hospice care. HIV: Continue home antiviral retrovirals  Kassius Battiste M. Roselinda Bahena MD 01/28/2024 9:44 AM

## 2024-01-29 ENCOUNTER — Telehealth: Payer: Self-pay

## 2024-01-29 DIAGNOSIS — Z7189 Other specified counseling: Secondary | ICD-10-CM | POA: Diagnosis not present

## 2024-01-29 DIAGNOSIS — I472 Ventricular tachycardia, unspecified: Secondary | ICD-10-CM | POA: Diagnosis not present

## 2024-01-29 DIAGNOSIS — Z515 Encounter for palliative care: Secondary | ICD-10-CM | POA: Diagnosis not present

## 2024-01-29 LAB — BASIC METABOLIC PANEL
Anion gap: 14 (ref 5–15)
BUN: 17 mg/dL (ref 8–23)
CO2: 21 mmol/L — ABNORMAL LOW (ref 22–32)
Calcium: 8.7 mg/dL — ABNORMAL LOW (ref 8.9–10.3)
Chloride: 104 mmol/L (ref 98–111)
Creatinine, Ser: 1.59 mg/dL — ABNORMAL HIGH (ref 0.61–1.24)
GFR, Estimated: 49 mL/min — ABNORMAL LOW (ref >60)
Glucose, Bld: 88 mg/dL (ref 70–99)
Potassium: 3.8 mmol/L (ref 3.5–5.1)
Sodium: 139 mmol/L (ref 135–145)

## 2024-01-29 LAB — MAGNESIUM: Magnesium: 2 mg/dL (ref 1.7–2.4)

## 2024-01-29 NOTE — Telephone Encounter (Signed)
Pt is now in hospice care. His therapies are turned off per [employer] rep. I canceled all upcoming remote appointments.

## 2024-01-29 NOTE — Progress Notes (Addendum)
Rounding Note    Patient Name: Ryan Long Date of Encounter: 01/29/2024  [facility] Cardiologist: previously Dr. Rudolpho Sevin AHF: Dr. Gala Romney EP:   Subjective   A little SOB, O2 placed last night, comfortable  Inpatient Medications    Scheduled Meds:  amiodarone  400 mg Oral BID   aspirin EC  81 mg Oral Daily   bictegravir-emtricitabine-tenofovir AF  1 tablet Oral Daily   empagliflozin  10 mg Oral QAC breakfast   eplerenone  12.5 mg Oral Daily   finasteride  5 mg Oral Daily   fluticasone furoate-vilanterol  1 puff Inhalation Daily   furosemide  40 mg Oral Daily   heparin  5,000 Units Subcutaneous Q8H   nicotine  14 mg Transdermal Q24H   tamsulosin  0.4 mg Oral BID   valACYclovir  500 mg Oral Daily   Continuous Infusions:  magnesium sulfate bolus IVPB Stopped (01/25/24 1737)   PRN Meds: acetaminophen, albuterol, LORazepam, magnesium hydroxide, morphine injection, ondansetron (ZOFRAN) IV   Vital Signs    Vitals:   01/28/24 1635 01/28/24 1948 01/28/24 2344 01/29/24 0514  BP: (!) 92/57 102/82 91/68 92/73   Pulse: 100 (!) 108 (!) 104 93  Resp: 18 18 16 18   Temp: 97.9 F (36.6 C) 97.9 F (36.6 C) 97.7 F (36.5 C) 97.9 F (36.6 C)  TempSrc: Oral Oral Oral Oral  SpO2: 98%   97%  Weight:    84.8 kg  Height:        Intake/Output Summary (Last 24 hours) at 01/29/2024 0729 Last data filed at 01/28/2024 1300 Gross per 24 hour  Intake 720 ml  Output --  Net 720 ml      01/29/2024    5:14 AM 01/28/2024    4:38 AM 01/27/2024    5:24 AM  Last 3 Weights  Weight (lbs) 186 lb 14.4 oz 186 lb 1.6 oz 186 lb 4.6 oz  Weight (kg) 84.777 kg 84.414 kg 84.5 kg      Telemetry    SR/ST, mostly 90's, occ to at times frequent PVCs, couplets, rare NSVTs, no sustained arrhythmia  - Personally Reviewed  ECG    No new EKGs  - Personally Reviewed  Physical Exam   Exam is stable GEN: No acute distress.   Neck: No JVD Cardiac: RRR, no murmurs, rubs, or  gallops.  Respiratory: some bronchial BS initially, that cleared completely with cough GI: Soft, nontender, non-distended  MS: No edema; No deformity. Neuro:  Nonfocal  Psych: Normal affect   Labs    High Sensitivity Troponin:  No results for input(s): "TROPONINIHS" in the last 720 hours.   Chemistry Recent Labs  Lab 01/25/24 0346 01/26/24 0647 01/27/24 0819 01/28/24 0409 01/29/24 0409  NA 142   < > 139 137 139  K 3.7   < > 4.2 3.4* 3.8  CL 107   < > 105 104 104  CO2 24   < > 24 23 21*  GLUCOSE 123*   < > 119* 86 88  BUN 12   < > 13 16 17   CREATININE 1.55*   < > 1.74* 1.57* 1.59*  CALCIUM 8.4*   < > 8.6* 8.3* 8.7*  MG 2.0   < > 2.0 2.0 2.0  PROT 5.4*  --   --   --   --   ALBUMIN 2.7*  --   --   --   --   AST 38  --   --   --   --  ALT 44  --   --   --   --   ALKPHOS 58  --   --   --   --   BILITOT 1.5*  --   --   --   --   GFRNONAA 50*   < > 44* 50* 49*  ANIONGAP 11   < > 10 10 14    < > = values in this interval not displayed.    Lipids No results for input(s): "CHOL", "TRIG", "HDL", "LABVLDL", "LDLCALC", "CHOLHDL" in the last 168 hours.  Hematology Recent Labs  Lab 01/24/24 0434 01/25/24 0346  WBC 3.3* 3.4*  RBC 5.41 5.33  HGB 18.2* 17.8*  HCT 50.9 49.6  MCV 94.1 93.1  MCH 33.6 33.4  MCHC 35.8 35.9  RDW 13.3 13.2  PLT 197 176   Thyroid No results for input(s): "TSH", "FREET4" in the last 168 hours.  BNPNo results for input(s): "BNP", "PROBNP" in the last 168 hours.  DDimer No results for input(s): "DDIMER" in the last 168 hours.   Radiology    No results found.   Cardiac Studies    01/03/2024: R/LHC Findings: Ao =88/68 (78) LV = 86/26 RA = 10 RV = 45/13 PA = 40/28 (34) PCW = 24 Thermo CO/CI = 3.8/1.9 Fick CO/CI = 3.2/1.6 PVR = 3.1 (Fick) 2.7 (TD) Ao sat = 95% PA sat = 64%, 60% PAPi = 1.2   Assessment: 1. Normal coronaries 2. Severe NICM with severe biventricular dysfunction LVEF 25% 3. Elevated filling pressures with low cardiac  output   Plan/Discussion:    Admit for HF optimization and assessment for candidacy for advanced therapies.    09/28/23: TTE 1. Left ventricular ejection fraction, by estimation, is 20 to 25%. The  left ventricle has severely decreased function. The left ventricle  demonstrates global hypokinesis. The left ventricular internal cavity size  was mildly to moderately dilated. Left  ventricular diastolic parameters are consistent with Grade I diastolic  dysfunction (impaired relaxation).   2. Right ventricular systolic function is normal. The right ventricular  size is normal. Tricuspid regurgitation signal is inadequate for assessing  PA pressure.   3. The mitral valve is normal in structure. Mild mitral valve  regurgitation.   4. The aortic valve is normal in structure. Aortic valve regurgitation is  not visualized.   5. The inferior vena cava is normal in size with greater than 50%  respiratory variability, suggesting right atrial pressure of 3 mmHg.      09/27/2021: TTE  1. Left ventricular ejection fraction, by estimation, is 20 to 25%. The  left ventricle has severely decreased function. The left ventricle  demonstrates global hypokinesis. The left ventricular internal cavity size  was mildly to moderately dilated. Left  ventricular diastolic parameters are consistent with Grade I diastolic  dysfunction (impaired relaxation).   2. Right ventricular systolic function is normal. The right ventricular  size is normal. Tricuspid regurgitation signal is inadequate for assessing  PA pressure.   3. The mitral valve is normal in structure. Mild mitral valve  regurgitation.   4. The aortic valve is normal in structure. Aortic valve regurgitation is  not visualized.   5. The inferior vena cava is normal in size with greater than 50%  respiratory variability, suggesting right atrial pressure of 3 mmHg.   Comparison(s): No significant change from prior study.      12/13/2021  TTE SUMMARY  CPS 02-26-14 EF has declined.  Echo contrast study was not performed due  to the patient refusal.  Ectopy during study.  There is mild concentric left ventricular hypertrophy.  The left ventricular size is normal.  LV ejection fraction = 25-30%.  Left ventricular systolic function is severely reduced.  Left ventricular filling pattern is indeterminate.  There is severe global hypokinesis of the left ventricle.  Device lead in the right ventricle  The right ventricle is mildly dilated.  The left atrium is mildly dilated.  The right atrium is mildly dilated.  There is mild mitral regurgitation.  There was insufficient TR detected to calculate RV systolic pressure.  There is a pacemaker lead in the right ventricle.   Last Stress Test  01/04/2022 IMPRESSION:  1. Images are consistent with dilated cardiomyopathy with a fixed  inferior wall defect and no significant reversibility. Of interest is the  fact that there is significant reverse redistribution of the septal wall  and anterior wall which is of unknown clinical significance   2. Calculated ejection fraction 17%   3. Dilated cardiomyopathy with an ejection fraction of 17% calculated and  a fixed defect of the inferior wall without significant reversibility.   Patient Profile     63 y.o. male h/o NICM, chronic sCHF s/p single chamber Abbott ICD, HLD, HIV, substance abuse (ETOH, THC, Cocaine), CKD II & polycythemia  Known hx of VT Long hx of noncompliance complicating his care Last seen in our clinic by myself 11/02/23, lengthy visit, a lot of symptoms, had VT in Oct Discussed meds at length Plan of care with in clinic MD Afterwards reported very unhappy with his care and cancelled all future visits with our team  Admitted 01/03/24 > HF exacerbation > R/LHC, planned for admission/HF magement, left AMA  not felt to be a candidate for destination therapy. He has a history of ongoing tobacco use and noncompliance    ER visit 01/16/24 for ICD shock > left AMA angry his room didn't have a TV  Admitted this stay feeling poorly > multiple VT episodes, therapies, had stopped his amiodarone Not felt to be overtly volume OL Started on IV amio Device interrogation revealed episodes of monomorphic VT in the 180s. Faster VT's have since been successfully treated with ATP previously.  Added a zone with ATP starting in the 170s.   Device History: Abbott Single Chamber ICD implanted 01/08/2018    + appropriate tx   AAD hx amiodarone, started 03/2023 at [facility]    Assessment & Plan    VT Known for him + NSVTs, has 3 morphologys, mostly slower rates now No sustained VT since 01/24/24 (had ATP then as well) Transitioned to PO amiodarone 01/27/24 (400mg  BID) Occ PVCs, couplets, rare NSVT s Off BB with concerns of low output and his hypotension K+ 3.8 Mag 2.0   NICM Chronic CHF He does not appear volume OL Appreciate HF team input End stage NICM Not a candidate for advanced therapies Palliative care on board > referral to [facility] >> HOSPICE planned for discharge into Hospice facility once insurance/placement finalized Appreciate Palliative/Hospice/Chaplain services DNR/DNI ICD tachy therapies turned off 01/28/24  Creat about baseline Uncertain I/O accuracy Continue jardiance Eplerenone  Patient refusing lasix, worried about his BP Some bronchial BS that clear completely with caough   HIV Home meds   For questions or updates, please contact [facility] Please consult [email or web address] for contact info under        Signed, Sheilah Pigeon, PA-C  01/29/2024, 7:29 AM    I have seen  and examined this patient with Francis Dowse.  Agree with above, note added to reflect my findings.  Feeling well today without major complaint.  Continues to have short runs of nonsustained VT.  ICD turned off yesterday.  GEN: Well nourished, well developed, in no acute distress  HEENT: normal   Neck: no JVD, carotid bruits, or masses Cardiac: RRR; no murmurs, rubs, or gallops,no edema  Respiratory:  clear to auscultation bilaterally, normal work of breathing GI: soft, nontender, nondistended, + BS MS: no deformity or atrophy  Skin: warm and dry, device site well healed Neuro:  Strength and sensation are intact Psych: euthymic mood, full affect   VT storm: Currently on p.o. amiodarone.  Having nausea and being covered with Zofran.  ICD has been turned off and patient has plans to be discharged with hospice care. Chronic systolic heart failure: Due to nonischemic cardiomyopathy.  No obvious volume overload.  Patient was put on oxygen due to shortness of breath, but no crackles on lung exam.  With low blood pressure, we Shamirah Ivan hold off on diuresis for now. HIV: Continue home medications  Javaria Knapke M. Ilda Laskin MD 01/29/2024 9:20 AM

## 2024-01-29 NOTE — Plan of Care (Signed)
  Problem: Education: Goal: Knowledge of General Education information will improve Description: Including pain rating scale, medication(s)/side effects and non-pharmacologic comfort measures Outcome: Progressing   Problem: Health Behavior/Discharge Planning: Goal: Ability to manage health-related needs will improve Outcome: Progressing   Problem: Clinical Measurements: Goal: Ability to maintain clinical measurements within normal limits will improve Outcome: Progressing Goal: Diagnostic test results will improve Outcome: Progressing Goal: Respiratory complications will improve Outcome: Progressing Goal: Cardiovascular complication will be avoided Outcome: Progressing   Problem: Activity: Goal: Risk for activity intolerance will decrease Outcome: Progressing   Problem: Nutrition: Goal: Adequate nutrition will be maintained Outcome: Progressing   Problem: Coping: Goal: Level of anxiety will decrease Outcome: Progressing   Problem: Elimination: Goal: Will not experience complications related to bowel motility Outcome: Progressing Goal: Will not experience complications related to urinary retention Outcome: Progressing   Problem: Pain Managment: Goal: General experience of comfort will improve and/or be controlled Outcome: Progressing   Problem: Safety: Goal: Ability to remain free from injury will improve Outcome: Progressing   Problem: Skin Integrity: Goal: Risk for impaired skin integrity will decrease Outcome: Progressing   Problem: Education: Goal: Knowledge of disease or condition will improve Outcome: Progressing Goal: Understanding of medication regimen will improve Outcome: Progressing Goal: Individualized Educational Video(s) Outcome: Progressing   Problem: Activity: Goal: Ability to tolerate increased activity will improve Outcome: Progressing   Problem: Cardiac: Goal: Ability to achieve and maintain adequate cardiopulmonary perfusion will  improve Outcome: Progressing   Problem: Health Behavior/Discharge Planning: Goal: Ability to safely manage health-related needs after discharge will improve Outcome: Progressing

## 2024-01-29 NOTE — Progress Notes (Addendum)
Please be advised that the above-named patient will require a short-term nursing home stay-anticipated 30 days or less for rehabilitation and strengthening. The plan is for return home.

## 2024-01-29 NOTE — TOC PASRR Note (Incomplete)
Marland Kitchen

## 2024-01-29 NOTE — Progress Notes (Signed)
Pt had 16 bts NSVT at 0142 and 19 bts at 0514. Pt asymptomatic. Strips saved per CCMD.

## 2024-01-29 NOTE — Discharge Summary (Addendum)
 DISCHARGE SUMMARY    Patient ID: Ryan Long,  MRN: [id number], DOB/AGE: [birth year] 63 y.o.  Admit date: 01/23/2024 Discharge date: 02/08/2024  Primary Care Physician: Cristino Martes, NP  Primary HF Cardiologist: Dr. Gala Romney Electrophysiologist: Dr. Elberta Fortis  Primary Discharge Diagnosis:  VT storm ICD therapies End stage cardiomyopathy   Secondary Discharge Diagnosis:  NICM HIV Substance abuse (by history) CKD (II) Polycythemia medication noncompliance  No Known Allergies   Procedures This Admission:  None  Consults: AHF team Palliative care  Brief HPI: Ryan Long is a 63 y.o. male with PMHx as above with recently worsening HF symptoms and increasing VT burden, admitted with VT storm, multiple ICD therapies (ATPs) advised to the ER via device clinic with patient reported acute increase in dizziness, not feeling well  Hospital Course:  The patient was admitted 01/23/24 after multiple episodes of VT and started on amiodarone gtt, having stopped his amiodarone as an outpatient, perhaps 2/2 nausea. Recent L/RHC with no CAD. On admit, not felt overtly volume overloaded, mild electrolyte abnormalities which were replaced, though not felt to play a role in his arrhythmia.  Device therapy zones adjusted with sustained VT episode on telemetry below detection 01/24/24, again with sustained episodes of tachycardia, initially some suspect of SVT via covering cardiology, though in review with EP MD, given axis change, it was consistent with VT, and IV amiodarone re-bolused > gtt rate increased. Hypotension and concerns of chronic low output. AHF team consulted.  Volume stable.  Previously deemed not an advanced therapies candidate 2/2 his poor compliance, mutliple early patient directed discharges, and no social support, increasing VT burden a marker of end stage cardiomyopathy recommended palliative care, pt requested DNR/DNI.  Eventually able to transition to  PO amiodarone with NSVTs only Palliative consulted and discussed Hospice care, ultimately patient wished to pursue Hospice. His ICD Tachy therapies were turned off, he did not want to return to his group home and planned to find placement at [facility] placement/SNF.   On 2/13 he had 2 falls, one getting up from the toilet, the other as he was walking back from the bathroom, denied syncope, CT head was without acute abnormality. Midodrine added for BP support. Ryan Long added for poor appetite. He had complaints of abdominal pain during admit.  UA was negative as well as CT abdomen and pelvis.  Pain resolved spontaneously.  He was maintained on home HIV medications.    In regards to amiodarone, he should continue on 200 mg BID for one month and then decrease to 200 mg daily.  He should also continue mexiletine as ordered.   The patient feels well, he denies any CP or SOB, he was examined by Dr. Elberta Fortis and considered stable for discharge to his Group Home with hosp[facility] care from [facility].   Physical Exam: Vitals:   02/07/24 1554 02/07/24 2023 02/08/24 0616 02/08/24 1300  BP: (!) 85/64 96/78 94/79  (!) 89/74  Pulse:  (!) 101 (!) 102 (!) 108  Resp: 17 20 18 16   Temp: 97.8 F (36.6 C) 97.9 F (36.6 C) 97.9 F (36.6 C) 97.9 F (36.6 C)  TempSrc: Oral Oral Oral Oral  SpO2:  100% 99% 96%  Weight:   86.8 kg   Height:        GEN- The patient is well appearing, alert and oriented x 3 today.   HEENT: normocephalic, atraumatic; sclera clear, conjunctiva pink Lungs-  CTA b/l, normal work of breathing.  No wheezes, rales,  rhonchi Heart- RRR, no murmurs, rubs or gallops GI- soft, non-tender, non-distended Extremities- no clubbing, cyanosis, or edema MS- no significant deformity or atrophy Skin- warm and dry, no rash or lesion Psych- euthymic mood, full affect Neuro- no gross defecits  Labs:   Lab Results  Component Value Date   WBC 3.4 (L) 01/25/2024   HGB 17.8 (H) 01/25/2024   HCT 49.6  01/25/2024   MCV 93.1 01/25/2024   PLT 176 01/25/2024    No results for input(s): "NA", "K", "CL", "CO2", "BUN", "CREATININE", "CALCIUM", "PROT", "BILITOT", "ALKPHOS", "ALT", "AST", "GLUCOSE" in the last 168 hours.  Invalid input(s): "LABALBU"   Discharge Medications:  Allergies as of 02/08/2024   No Known Allergies      Medication List     STOP taking these medications    Entresto 24-26 MG Generic drug: sacubitril-valsartan   furosemide 20 MG tablet Commonly known as: LASIX   metoprolol succinate 25 MG 24 hr tablet Commonly known as: TOPROL-XL   nicotine 21 mg/24hr patch Commonly known as: NICODERM CQ - dosed in mg/24 hours   potassium chloride SA 20 MEQ tablet Commonly known as: KLOR-CON M   sildenafil 100 MG tablet Commonly known as: VIAGRA       TAKE these medications    albuterol 108 (90 Base) MCG/ACT inhaler Commonly known as: VENTOLIN HFA Inhale 2 puffs into the lungs every 6 (six) hours as needed for wheezing or shortness of breath.   amiodarone 200 MG tablet Commonly known as: PACERONE Take 1 tablet (200 mg total) by mouth 2 (two) times daily. What changed: when to take this   amiodarone 200 MG tablet Commonly known as: Pacerone Take 1 tablet (200 mg total) by mouth daily. Start taking on: March 09, 2024 What changed: You were already taking a medication with the same name, and this prescription was added. Make sure you understand how and when to take each.   aspirin EC 81 MG tablet Take 81 mg by mouth in the morning. Swallow whole.   Biktarvy 50-200-25 MG Tabs tablet Generic drug: bictegravir-emtricitabine-tenofovir AF Take 1 tablet by mouth daily. What changed: when to take this   Breo Ellipta 200-25 MCG/ACT Aepb Generic drug: fluticasone furoate-vilanterol Inhale 1 puff into the lungs daily.   clonazepam 0.125 MG disintegrating tablet Commonly known as: KLONOPIN Take 1 tablet (0.125 mg total) by mouth 3 (three) times daily.    dronabinol 5 MG capsule Commonly known as: Ryan Long Take 1 capsule (5 mg total) by mouth 2 (two) times daily before lunch and supper. Start taking on: February 09, 2024   empagliflozin 10 MG Tabs tablet Commonly known as: Jardiance Take 1 tablet (10 mg total) by mouth daily before breakfast.   eplerenone 25 MG tablet Commonly known as: INSPRA Take 1 tablet (25 mg total) by mouth daily. Start taking on: February 09, 2024   finasteride 5 MG tablet Commonly known as: PROSCAR TAKE 1 TABLET(5 MG) BY MOUTH DAILY What changed: See the new instructions.   IBUPROFEN PO Take 400 mg by mouth every 6 (six) hours as needed for mild pain (pain score 1-3), moderate pain (pain score 4-6) or fever. Liquid gel   LORazepam 0.5 MG tablet Commonly known as: ATIVAN Take 1-2 tablets (0.5-1 mg total) by mouth every 4 (four) hours as needed for anxiety.   Melatonin 10 MG Tabs Take 10 mg by mouth at bedtime.   MENS MULTIPLUS PO Take 1 tablet by mouth daily.   mexiletine 250 MG capsule Commonly  known as: MEXITIL Take 1 capsule (250 mg total) by mouth every 12 (twelve) hours.   midodrine 10 MG tablet Commonly known as: PROAMATINE Take 1 tablet (10 mg total) by mouth 3 (three) times daily with meals. Start taking on: February 09, 2024   polyethylene glycol 17 g packet Commonly known as: MIRALAX / GLYCOLAX Take 17 g by mouth daily. Start taking on: February 09, 2024   tamsulosin 0.4 MG Caps capsule Commonly known as: FLOMAX TAKE 2 CAPSULES(0.8 MG) BY MOUTH DAILY AFTER SUPPER What changed:  how much to take how to take this when to take this additional instructions   triamcinolone ointment 0.5 % Commonly known as: KENALOG Apply 1 Application topically 2 (two) times daily. What changed: when to take this   valACYclovir 500 MG tablet Commonly known as: VALTREX Take 1 tablet (500 mg total) by mouth daily. What changed: when to take this               Durable Medical Equipment   (From admission, onward)           Start     Ordered   02/08/24 1604  For home use only DME Walker rolling  Once       Question Answer Comment  Walker: With 5 Inch Wheels   Patient needs a walker to treat with the following condition General weakness      02/08/24 1605            Disposition: Returning to Group Home with Home Hospice   Discharge Instructions     Call MD for:  difficulty breathing, headache or visual disturbances   Complete by: As directed    Call MD for:  extreme fatigue   Complete by: As directed    Call MD for:  persistant dizziness or light-headedness   Complete by: As directed    Call MD for:  persistant nausea and vomiting   Complete by: As directed    Call MD for:  severe uncontrolled pain   Complete by: As directed    Diet - low sodium heart healthy   Complete by: As directed    Discharge instructions   Complete by: As directed    1. Review medications carefully as they have changed.  2. He Ryan Long have a rolling walker  3. He Ryan Long be followed by hospice through [facility]   Increase activity slowly   Complete by: As directed        Follow-up Information     [facility] [place] [place] Follow up.   Why: Hospice RN to call with visit time- Ryan Long try to see at 6:00 pm tonight. Contact information: [address] [facility] [place] [address] [phone]         [facility] Follow up.   Why: Rolling Walker. Contact information: [employer] Address: [address], [place], [place] [address] Phone: [phone]                Duration of Discharge Encounter: 38 minutes, APP time.  Signed, Ryan Brim, NP-C, AGACNP-BC [facility]  02/08/2024, 5:10 PM      I have seen and examined this patient with Ryan Long.  Agree with above, note added to reflect my findings.  Patient was admitted to the hospital with multiple episodes of  ventricular tachycardia.  He had multiple VT morphologies.  He was started on amiodarone and mexiletine.  He was seen by  the heart failure team while in the hospital.  It was determined that he had end-stage heart failure.  Palliative care was consulted and the patient was transition to comfort measures.  He is feeling well today.  He has no acute complaints.  GEN: Well nourished, well developed, in no acute distress  HEENT: normal  Neck: no JVD, carotid bruits, or masses Cardiac: RRR; no murmurs, rubs, or gallops,no edema  Respiratory:  clear to auscultation bilaterally, normal work of breathing GI: soft, nontender, nondistended, + BS MS: no deformity or atrophy  Skin: warm and dry, device site well healed Neuro:  Strength and sensation are intact Psych: euthymic mood, full affect   Ventricular tachycardia: Has had multiple morphologies.  Currently on amiodarone 200 mg twice daily, mexiletine 250 mg twice daily.  Dorn Hartshorne discharge on this regimen.  Kristina Bertone reduce amiodarone to 200 mg a day after taking it for 1 month. Acute on chronic systolic heart failure: Patient is euvolemic currently.  On no heart failure medications due to hospice care. Hypotension: Continue midodrine  MD time spent on patient care and discharge: 40 minutes  Fenix Ruppe M. Prayan Ulin MD 02/11/2024 11:49 AM

## 2024-01-29 NOTE — Progress Notes (Signed)
   Palliative Medicine Inpatient Follow Up Note HPI: Ryan Long presents today for evaluation of worsening VT. He is a pleasant 63 year old man with a history of hypertension, depression, chronic systolic heart failure, HIV disease, and a history of polysubstance abuse. Palliative care has been asked to get involved in the setting of end stage heart failure.   Today's Discussion 01/29/2024  *Please note that this is a verbal dictation therefore any spelling or grammatical errors are due to the "Dragon Medical One" system interpretation.  Chart reviewed inclusive of vital signs, progress notes, laboratory results, and diagnostic images.   I spoke with Ryan Long's RN, Ryan Long who denies concerns for Ryan Long this afternoon.  On assessment Ryan Long is in no distress. Awaiting discharge plan(s).  AICD was deactivated yesterday afternoon by [facility].  TOC is working on placement as patient does not want to go back to his sober living home.   Questions and concerns addressed/Palliative Support Provided.   Objective Assessment: Vital Signs Vitals:   01/29/24 0514 01/29/24 0854  BP: 92/73 97/69  Pulse: 93 95  Resp: 18 19  Temp: 97.9 F (36.6 C) 98.3 F (36.8 C)  SpO2: 97% 98%    Intake/Output Summary (Last 24 hours) at 01/29/2024 1224 Last data filed at 01/29/2024 1000 Gross per 24 hour  Intake 596 ml  Output --  Net 596 ml   Last Weight  Most recent update: 01/29/2024  5:20 AM    Weight  84.8 kg (186 lb 14.4 oz)            Gen: Older African-American male in no acute distress HEENT: moist mucous membranes CV: Regular rate and rhythm PULM: On RA, breathing is even and nonlabored ABD: soft/nontender EXT: No edema Neuro: Alert and oriented x3  SUMMARY OF RECOMMENDATIONS   DNAR/DNI  AICD  deactivated   Awaiting placement with [facility] following  PMT will continue to follow along  25  minutes ______________________________________________________________________________________ [name] Rebecca Palliative Medicine Team Team Cell Phone: [phone] Please utilize secure chat with additional questions, if there is no response within 30 minutes please call the above phone number  Palliative Medicine Team providers are available by phone from 7am to 7pm daily and can be reached through the team cell phone.  Should this patient require assistance outside of these hours, please call the patient's attending physician.

## 2024-01-29 NOTE — Progress Notes (Signed)
Physical Therapy Discharge Patient Details Name: Ryan Long MRN: [id number] DOB: 1961-08-08 Today's Date: 01/29/2024 Time: 5366-4403 PT Time Calculation (min) (ACUTE ONLY): 25 min  Patient discharged from PT services secondary to goals met and no further PT needs identified.  Please see latest therapy progress note for current level of functioning and progress toward goals.    Progress and discharge plan discussed with patient and/or caregiver: Patient/Caregiver agrees with plan  Cheri Guppy, PT, DPT Acute Rehabilitation Services Office: [phone] Secure Chat Preferred      Richardson Chiquito 01/29/2024, 5:08 PM

## 2024-01-29 NOTE — [place] FL2 (Addendum)
[place] MEDICAID FL2 LEVEL OF CARE FORM     IDENTIFICATION  Patient Name: Ryan Long Birthdate: [birth year] Sex: male Admission Date (Current Location): 01/23/2024  [facility] and [place] Number:  Producer, television/film/video and Address:  [facility], [address] [place], [place] [address]      Provider Number: [id number]  Attending Physician Name and Address:  Marinus Maw, MD  Relative Name and Phone Number:       Current Level of Care: Hospital Recommended Level of Care: Skilled Nursing Facility Prior Approval Number:    Date Approved/Denied:   PASRR Number:    Discharge Plan: SNF    Current Diagnoses: Patient Active Problem List   Diagnosis Date Noted   Ventricular tachycardia (HCC) 01/23/2024   Acute on chronic systolic CHF (congestive heart failure) (HCC) 01/03/2024   Asthmatic bronchitis 01/24/2023   Rash and nonspecific skin eruption 12/19/2022   Arm numbness 11/15/2022   Aortic atherosclerosis (HCC) 11/15/2022   Chronic kidney disease 08/08/2022   Healthcare maintenance 08/08/2022   Cigarette smoker 01/26/2022   ETD (Eustachian tube dysfunction), right 01/26/2022   Allergy to pollen 01/26/2022   Polycythemia 12/08/2021   Vitamin D deficiency 12/08/2021   Hip pain 01/06/2021   VT (ventricular tachycardia) (HCC) 08/25/2020   HIV positive (HCC) 07/12/2020   Atrial fibrillation (HCC) 06/10/2020   BPH (benign prostatic hyperplasia) 01/28/2020   HFrEF (heart failure with reduced ejection fraction) (HCC) 01/28/2020   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   Achilles tendinitis, left leg 04/03/2019   AICD (automatic cardioverter/defibrillator) present 08/15/2018   Abnormal EKG 06/28/2018   Adjustment disorder with mixed disturbance of emotions and conduct    NICM (nonischemic cardiomyopathy) (HCC) 01/08/2018   Anxiety 12/20/2017   Bipolar 1 disorder (HCC) 10/10/2017   Chronic systolic heart failure (HCC) 05/13/2017   HSV-2  infection 07/17/2016   History of attempted suicide 04/14/2016   History of alcoholism (HCC) 04/14/2016   History of substance abuse (HCC) 04/14/2016   Constipation 01/17/2016   COPD exacerbation (HCC) 10/04/2015   Abnormal thyroid stimulating hormone (TSH) level 10/04/2015   MDD (major depressive disorder), recurrent episode, severe (HCC) 09/23/2015   Hypertension 09/10/2015   PTSD (post-traumatic stress disorder) 08/07/2015   Primary insomnia 04/02/2015   Herpesviral infection of penis 04/02/2015   Generalized anxiety disorder 05/29/2014   Tobacco use disorder 02/17/2013   Heart failure (HCC) 09/19/2011   Other primary cardiomyopathies 08/24/2011   Human immunodeficiency virus (HIV) disease (HCC) 02/01/2009   Recurrent HSV (herpes simplex virus) 02/01/2009   Hyperlipidemia 02/01/2009   Allergic rhinitis 02/01/2009    Orientation RESPIRATION BLADDER Height & Weight     Self, Time, Situation, Place  Normal Continent Weight: 186 lb 14.4 oz (84.8 kg) Height:  5\' 8"  (172.7 cm)  BEHAVIORAL SYMPTOMS/MOOD NEUROLOGICAL BOWEL NUTRITION STATUS      Continent Diet (See dc summary)  AMBULATORY STATUS COMMUNICATION OF NEEDS Skin   Limited Assist Verbally Normal                       Personal Care Assistance Level of Assistance  Bathing, Feeding, Dressing, Total care Bathing Assistance: Limited assistance Feeding assistance: Limited assistance Dressing Assistance: Limited assistance Total Care Assistance: Limited assistance   Functional Limitations Info  Sight, Hearing, Speech Sight Info: Impaired Hearing Info: Adequate Speech Info: Adequate    SPECIAL CARE FACTORS FREQUENCY  PT (By licensed PT), OT (By licensed OT)     PT  Frequency: 5x weekly OT Frequency: 5x weekly            Contractures      Additional Factors Info  Code Status, Allergies Code Status Info: DNR- Limited Allergies Info: No known allergies           Current Medications (01/29/2024):  This  is the current hospital active medication list Current Facility-Administered Medications  Medication Dose Route Frequency Provider Last Rate Last Admin   acetaminophen (TYLENOL) tablet 650 mg  650 mg Oral Q4H PRN Marcelino Duster, PA   650 mg at 01/27/24 1129   albuterol (PROVENTIL) (2.5 MG/3ML) 0.083% nebulizer solution 2.5 mg  2.5 mg Inhalation Q6H PRN Marinus Maw, MD       amiodarone (PACERONE) tablet 400 mg  400 mg Oral BID Lanier Prude, MD   400 mg at 01/29/24 0818   aspirin EC tablet 81 mg  81 mg Oral Daily Marcelino Duster, [place]   81 mg at 01/29/24 0817   bictegravir-emtricitabine-tenofovir AF (BIKTARVY) 50-200-25 MG per tablet 1 tablet  1 tablet Oral Daily Marcelino Duster, PA   1 tablet at 01/29/24 0817   empagliflozin (JARDIANCE) tablet 10 mg  10 mg Oral QAC breakfast Marcelino Duster, [place]   10 mg at 01/29/24 0981   eplerenone (INSPRA) tablet 12.5 mg  12.5 mg Oral Daily Lee, [place], NP   12.5 mg at 01/29/24 0818   finasteride (PROSCAR) tablet 5 mg  5 mg Oral Daily Marcelino Duster, PA   5 mg at 01/29/24 0817   fluticasone furoate-vilanterol (BREO ELLIPTA) 200-25 MCG/ACT 1 puff  1 puff Inhalation Daily Marcelino Duster, PA   1 puff at 01/29/24 0815   furosemide (LASIX) tablet 40 mg  40 mg Oral Daily Lanier Prude, MD       heparin injection 5,000 Units  5,000 Units Subcutaneous Q8H Marcelino Duster, [place]   5,000 Units at 01/29/24 0538   LORazepam (ATIVAN) injection 1 mg  1 mg Intravenous Q4H PRN Romie Minus, MD   1 mg at 01/28/24 2253   magnesium hydroxide (MILK OF MAGNESIA) suspension 30 mL  30 mL Oral Daily PRN Marinus Maw, MD       magnesium sulfate IVPB 2 g 50 mL  2 g Intravenous Once Rubye Oaks, MD   Stopped at 01/25/24 1737   morphine (PF) 2 MG/ML injection 2 mg  2 mg Intravenous Q2H PRN Romie Minus, MD   2 mg at 01/28/24 1551   nicotine (NICODERM CQ - dosed in mg/24 hours) patch 14 mg  14 mg Transdermal Q24H Marcelino Duster, PA   14 mg at 01/29/24 0818   ondansetron (ZOFRAN) injection 4 mg  4 mg Intravenous Q6H PRN Marcelino Duster, PA   4 mg at 01/28/24 1934   tamsulosin (FLOMAX) capsule 0.4 mg  0.4 mg Oral BID Marcelino Duster, PA   0.4 mg at 01/29/24 1914   valACYclovir (VALTREX) tablet 500 mg  500 mg Oral Daily Marcelino Duster, PA   500 mg at 01/29/24 7829     Discharge Medications: Please see discharge summary for a list of discharge medications.  Relevant Imaging Results:  Relevant Lab Results:   Additional Information SSN: [id number]  Reva Bores, LCSWA

## 2024-01-29 NOTE — Progress Notes (Signed)
Notified by CCMD Patient continues to have runs of VTach. A 9 beat run and a 10 beat run of VTach. Pt asymptomatic, will continue to monitor.

## 2024-01-29 NOTE — Progress Notes (Signed)
Physical Therapy Treatment and Discharge Patient Details Name: Ryan Long MRN: [id number] DOB: [birth year] Today's Date: 01/29/2024   History of Present Illness Pt is a 63 y.o. male presenting on 01/23/24 with palpitations, lightheadedness, and AICD problems. Admitted with VT. Rapid called 2/6 for SVT rates 180s-190s. PHMx: AICD, alcohol abuse, Bipolar 1 disorder, cervical lymphadenitis, CHF, COPD, crack cocaine use, depression, genital herpes, HIV, HLD, HTN, NSVT, PTSD    PT Comments  Pt is modI for all functional mobility. He has met 3/3 of his acute PT goals. Educated pt on safe and proper use of rollator. He demonstrated good safety awareness and sequencing with the AD. Discussed energy conservation techniques and pt verbalized understanding. Reviewed HEP and pt verbalized confidence in being able to perform this independently. Patient feels ready and safe for d/c. I have answered all his questions related to mobility. No further acute PT needs. Recommend OOB<>chair 3x/day and ambulating with nursing/mobility using rollator.     If plan is discharge home, recommend the following: Assistance with cooking/housework   Can travel by Pension scheme manager (4 wheels)    Recommendations for Other Services       Precautions / Restrictions Precautions Precautions: Fall Restrictions Weight Bearing Restrictions Per Provider Order: No     Mobility  Bed Mobility Overal bed mobility: Modified Independent             General bed mobility comments: Pt sat up on the L side of bed with HOB flat and no use of handrails, requires slightly increase time to complete supine>sit.    Transfers Overall transfer level: Modified independent Equipment used: None, Rollator (4 wheels) Transfers: Sit to/from Stand Sit to Stand: Modified independent (Device/Increase time)           General transfer comment: STS from EOB and rollator seat x4. Pt  steady throughout and displayed good eccentric control.    Ambulation/Gait Ambulation/Gait assistance: Modified independent (Device/Increase time) Gait Distance (Feet): 150 Feet (1x150, seated rest; 2x60, seated rest, 1x150) Assistive device: Rollator (4 wheels) Gait Pattern/deviations: Step-through pattern, Decreased stride length Gait velocity: Decreased Gait velocity interpretation: <1.8 ft/sec, indicate of risk for recurrent falls   General Gait Details: Educated pt on proper sequencing and technique of rollator. Pt ambulated within the hallway with slow, equal steps, with good foot clearence. His BOS was shoulder width apart and rollator was positioned appropriately in front of him. Pt was able to appropriately lock/unlock the AD and displayed good safety awareness initiating rest breaks prn.   Stairs Stairs: Yes Stairs assistance: Contact guard assist, Modified independent (Device/Increase time) Stair Management: Two rails, Step to pattern, Forwards, Alternating pattern Number of Stairs: 7 (x2) General stair comments: First attempt pt required CGA. He descended with a step to pattern leading with LLE and ascended with a reciprocal pattern. Second attempt pt initially supervision quickly progressed to modI and descending/ascendening with reciprocal gait pattern. Pt took a standing rest break after going down/up.   Wheelchair Mobility     Tilt Bed    Modified Rankin (Stroke Patients Only)       Balance Overall balance assessment: No apparent balance deficits (not formally assessed)                                          Communication Communication Communication: No apparent difficulties  Cognition Arousal: Alert Behavior During Therapy: WFL for tasks assessed/performed   PT - Cognitive impairments: No apparent impairments                         Following commands: Intact      Cueing Cueing Techniques: Verbal cues, Tactile cues   Exercises      General Comments General comments (skin integrity, edema, etc.): VSS on RA      Pertinent Vitals/Pain Pain Assessment Pain Assessment: No/denies pain    Home Living                          Prior Function            PT Goals (current goals can now be found in the care plan section) Acute Rehab PT Goals Patient Stated Goal: Move without SOB Progress towards PT goals: Goals met/education completed, patient discharged from PT    Frequency    Min 1X/week      PT Plan      Co-evaluation              AM-PAC PT "6 Clicks" Mobility   Outcome Measure  Help needed turning from your back to your side while in a flat bed without using bedrails?: None Help needed moving from lying on your back to sitting on the side of a flat bed without using bedrails?: None Help needed moving to and from a bed to a chair (including a wheelchair)?: None Help needed standing up from a chair using your arms (e.g., wheelchair or bedside chair)?: None Help needed to walk in hospital room?: None Help needed climbing 3-5 steps with a railing? : None 6 Click Score: 24    End of Session Equipment Utilized During Treatment: Gait belt Activity Tolerance: Patient tolerated treatment well Patient left: in bed;with call bell/phone within reach Nurse Communication: Mobility status PT Visit Diagnosis: Other abnormalities of gait and mobility (R26.89);Muscle weakness (generalized) (M62.81);History of falling (Z91.81);Dizziness and giddiness (R42)     Time: 1510-1535 PT Time Calculation (min) (ACUTE ONLY): 25 min  Charges:    $Gait Training: 23-37 mins PT General Charges $$ ACUTE PT VISIT: 1 Visit                     Cheri Guppy, PT, DPT Acute Rehabilitation Services Office: [phone] Secure Chat Preferred   Richardson Chiquito 01/29/2024, 5:03 PM

## 2024-01-30 ENCOUNTER — Ambulatory Visit: Payer: Self-pay | Admitting: Infectious Disease

## 2024-01-30 ENCOUNTER — Inpatient Hospital Stay (HOSPITAL_COMMUNITY): Payer: 59

## 2024-01-30 DIAGNOSIS — Z515 Encounter for palliative care: Secondary | ICD-10-CM | POA: Diagnosis not present

## 2024-01-30 DIAGNOSIS — I472 Ventricular tachycardia, unspecified: Secondary | ICD-10-CM | POA: Diagnosis not present

## 2024-01-30 DIAGNOSIS — K5900 Constipation, unspecified: Secondary | ICD-10-CM | POA: Diagnosis not present

## 2024-01-30 DIAGNOSIS — R4589 Other symptoms and signs involving emotional state: Secondary | ICD-10-CM

## 2024-01-30 LAB — MAGNESIUM: Magnesium: 2 mg/dL (ref 1.7–2.4)

## 2024-01-30 LAB — BASIC METABOLIC PANEL
Anion gap: 16 — ABNORMAL HIGH (ref 5–15)
BUN: 14 mg/dL (ref 8–23)
CO2: 20 mmol/L — ABNORMAL LOW (ref 22–32)
Calcium: 8.5 mg/dL — ABNORMAL LOW (ref 8.9–10.3)
Chloride: 102 mmol/L (ref 98–111)
Creatinine, Ser: 1.46 mg/dL — ABNORMAL HIGH (ref 0.61–1.24)
GFR, Estimated: 54 mL/min — ABNORMAL LOW (ref >60)
Glucose, Bld: 135 mg/dL — ABNORMAL HIGH (ref 70–99)
Potassium: 3.5 mmol/L (ref 3.5–5.1)
Sodium: 138 mmol/L (ref 135–145)

## 2024-01-30 MED ORDER — BISACODYL 5 MG PO TBEC
10.0000 mg | DELAYED_RELEASE_TABLET | Freq: Once | ORAL | Status: AC
  Administered 2024-01-30: 10 mg via ORAL
  Filled 2024-01-30: qty 2

## 2024-01-30 MED ORDER — FUROSEMIDE 10 MG/ML IJ SOLN: 40.0000 mg | Freq: Once | INTRAMUSCULAR | Status: DC

## 2024-01-30 MED ORDER — POLYETHYLENE GLYCOL 3350 17 G PO PACK
17.0000 g | PACK | Freq: Every day | ORAL | Status: DC
  Administered 2024-01-30 – 2024-02-08 (×8): 17 g via ORAL
  Filled 2024-01-30 (×8): qty 1

## 2024-01-30 MED ORDER — EPLERENONE 25 MG PO TABS
25.0000 mg | ORAL_TABLET | Freq: Every day | ORAL | Status: DC
  Administered 2024-01-31 – 2024-02-08 (×8): 25 mg via ORAL
  Filled 2024-01-30 (×9): qty 1

## 2024-01-30 MED ORDER — CLONAZEPAM 0.125 MG PO TBDP
0.1250 mg | ORAL_TABLET | Freq: Three times a day (TID) | ORAL | Status: DC
  Administered 2024-01-30 – 2024-02-08 (×28): 0.125 mg via ORAL
  Filled 2024-01-30 (×28): qty 1

## 2024-01-30 MED ORDER — AMIODARONE HCL 200 MG PO TABS
200.0000 mg | ORAL_TABLET | Freq: Two times a day (BID) | ORAL | Status: DC
  Administered 2024-01-30 – 2024-02-08 (×18): 200 mg via ORAL
  Filled 2024-01-30 (×18): qty 1

## 2024-01-30 NOTE — NC FL2 (Deleted)
[place] MEDICAID FL2 LEVEL OF CARE FORM     IDENTIFICATION  Patient Name: Ryan Long Birthdate: 08-Mar-1961 Sex: male Admission Date (Current Location): 01/23/2024  [facility] and [place] Number:  Producer, television/film/video and Address:  [facility] [place], [address] [place], [place] [address]      Provider Number: [id number]  Attending Physician Name and Address:  Marinus Maw, MD  Relative Name and Phone Number:       Current Level of Care: Hospital Recommended Level of Care: Skilled Nursing Facility Prior Approval Number:    Date Approved/Denied:   PASRR Number:    Discharge Plan: SNF    Current Diagnoses: Patient Active Problem List   Diagnosis Date Noted   Ventricular tachycardia (HCC) 01/23/2024   Acute on chronic systolic CHF (congestive heart failure) (HCC) 01/03/2024   Asthmatic bronchitis 01/24/2023   Rash and nonspecific skin eruption 12/19/2022   Arm numbness 11/15/2022   Aortic atherosclerosis (HCC) 11/15/2022   Chronic kidney disease 08/08/2022   Healthcare maintenance 08/08/2022   Cigarette smoker 01/26/2022   ETD (Eustachian tube dysfunction), right 01/26/2022   Allergy to pollen 01/26/2022   Polycythemia 12/08/2021   Vitamin D deficiency 12/08/2021   Hip pain 01/06/2021   VT (ventricular tachycardia) (HCC) 08/25/2020   HIV positive (HCC) 07/12/2020   Atrial fibrillation (HCC) 06/10/2020   BPH (benign prostatic hyperplasia) 01/28/2020   HFrEF (heart failure with reduced ejection fraction) (HCC) 01/28/2020   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   Achilles tendinitis, left leg 04/03/2019   AICD (automatic cardioverter/defibrillator) present 08/15/2018   Abnormal EKG 06/28/2018   Adjustment disorder with mixed disturbance of emotions and conduct    NICM (nonischemic cardiomyopathy) (HCC) 01/08/2018   Anxiety 12/20/2017   Bipolar 1 disorder (HCC) 10/10/2017   Chronic systolic heart failure (HCC) 05/13/2017   HSV-2  infection 07/17/2016   History of attempted suicide 04/14/2016   History of alcoholism (HCC) 04/14/2016   History of substance abuse (HCC) 04/14/2016   Constipation 01/17/2016   COPD exacerbation (HCC) 10/04/2015   Abnormal thyroid stimulating hormone (TSH) level 10/04/2015   MDD (major depressive disorder), recurrent episode, severe (HCC) 09/23/2015   Hypertension 09/10/2015   PTSD (post-traumatic stress disorder) 08/07/2015   Primary insomnia 04/02/2015   Herpesviral infection of penis 04/02/2015   Generalized anxiety disorder 05/29/2014   Tobacco use disorder 02/17/2013   Heart failure (HCC) 09/19/2011   Other primary cardiomyopathies 08/24/2011   Human immunodeficiency virus (HIV) disease (HCC) 02/01/2009   Recurrent HSV (herpes simplex virus) 02/01/2009   Hyperlipidemia 02/01/2009   Allergic rhinitis 02/01/2009    Orientation RESPIRATION BLADDER Height & Weight     Self, Time, Situation, Place  Normal Continent Weight: 187 lb 6.4 oz (85 kg) Height:  5\' 8"  (172.7 cm)  BEHAVIORAL SYMPTOMS/MOOD NEUROLOGICAL BOWEL NUTRITION STATUS      Continent Diet (See dc summary)  AMBULATORY STATUS COMMUNICATION OF NEEDS Skin   Limited Assist Verbally Normal                       Personal Care Assistance Level of Assistance  Bathing, Feeding, Dressing, Total care Bathing Assistance: Limited assistance Feeding assistance: Limited assistance Dressing Assistance: Limited assistance Total Care Assistance: Limited assistance   Functional Limitations Info  Sight, Hearing, Speech Sight Info: Impaired Hearing Info: Adequate Speech Info: Adequate    SPECIAL CARE FACTORS FREQUENCY  PT (By licensed PT), OT (By licensed OT)     PT  Frequency: 5x weekly OT Frequency: 5x weekly            Contractures      Additional Factors Info  Code Status, Allergies Code Status Info: DNR- Limited Allergies Info: No known allergies           Current Medications (01/30/2024):  This is  the current hospital active medication list Current Facility-Administered Medications  Medication Dose Route Frequency Provider Last Rate Last Admin   acetaminophen (TYLENOL) tablet 650 mg  650 mg Oral Q4H PRN Marcelino Duster, PA   650 mg at 01/30/24 1258   albuterol (PROVENTIL) (2.5 MG/3ML) 0.083% nebulizer solution 2.5 mg  2.5 mg Inhalation Q6H PRN Marinus Maw, MD   2.5 mg at 01/29/24 2142   amiodarone (PACERONE) tablet 200 mg  200 mg Oral BID Sheilah Pigeon, PA-C       aspirin EC tablet 81 mg  81 mg Oral Daily Marcelino Duster, [place]   81 mg at 01/30/24 0734   bictegravir-emtricitabine-tenofovir AF (BIKTARVY) 50-200-25 MG per tablet 1 tablet  1 tablet Oral Daily Marcelino Duster, [place]   1 tablet at 01/30/24 1610   clonazepam (KLONOPIN) disintegrating tablet 0.125 mg  0.125 mg Oral TID Ernie Avena, NP   0.125 mg at 01/30/24 1248   empagliflozin (JARDIANCE) tablet 10 mg  10 mg Oral QAC breakfast Marcelino Duster, [place]   10 mg at 01/30/24 0734   [START ON 01/31/2024] eplerenone (INSPRA) tablet 25 mg  25 mg Oral Daily Sheilah Pigeon, PA-C       finasteride (PROSCAR) tablet 5 mg  5 mg Oral Daily Marcelino Duster, [place]   5 mg at 01/30/24 0734   fluticasone furoate-vilanterol (BREO ELLIPTA) 200-25 MCG/ACT 1 puff  1 puff Inhalation Daily Marcelino Duster, PA   1 puff at 01/30/24 0736   furosemide (LASIX) injection 40 mg  40 mg Intravenous Once Ursuy, Renee Lynn, PA-C       heparin injection 5,000 Units  5,000 Units Subcutaneous Q8H Marcelino Duster, [place]   5,000 Units at 01/30/24 1314   LORazepam (ATIVAN) injection 1 mg  1 mg Intravenous Q4H PRN Romie Minus, MD   1 mg at 01/30/24 0347   magnesium hydroxide (MILK OF MAGNESIA) suspension 30 mL  30 mL Oral Daily PRN Marinus Maw, MD   30 mL at 01/30/24 0746   magnesium sulfate IVPB 2 g 50 mL  2 g Intravenous Once Rubye Oaks, MD   Stopped at 01/25/24 1737   morphine (PF) 2 MG/ML injection 2 mg  2 mg  Intravenous Q2H PRN Romie Minus, MD   2 mg at 01/28/24 1551   nicotine (NICODERM CQ - dosed in mg/24 hours) patch 14 mg  14 mg Transdermal Q24H Marcelino Duster, PA   14 mg at 01/30/24 0737   polyethylene glycol (MIRALAX / GLYCOLAX) packet 17 g  17 g Oral Daily Ernie Avena, NP   17 g at 01/30/24 0959   tamsulosin (FLOMAX) capsule 0.4 mg  0.4 mg Oral BID Marcelino Duster, PA   0.4 mg at 01/30/24 9604   valACYclovir (VALTREX) tablet 500 mg  500 mg Oral Daily Marcelino Duster, PA   500 mg at 01/30/24 5409     Discharge Medications: Please see discharge summary for a list of discharge medications.  Relevant Imaging Results:  Relevant Lab Results:   Additional Information SSN: [id number]  Reva Bores, LCSWA

## 2024-01-30 NOTE — Plan of Care (Signed)
  Problem: Education: Goal: Knowledge of General Education information will improve Description: Including pain rating scale, medication(s)/side effects and non-pharmacologic comfort measures Outcome: Progressing   Problem: Health Behavior/Discharge Planning: Goal: Ability to manage health-related needs will improve Outcome: Progressing   Problem: Clinical Measurements: Goal: Ability to maintain clinical measurements within normal limits will improve Outcome: Progressing Goal: Diagnostic test results will improve Outcome: Progressing Goal: Respiratory complications will improve Outcome: Progressing Goal: Cardiovascular complication will be avoided Outcome: Progressing   Problem: Activity: Goal: Risk for activity intolerance will decrease Outcome: Progressing   Problem: Nutrition: Goal: Adequate nutrition will be maintained Outcome: Progressing   Problem: Coping: Goal: Level of anxiety will decrease Outcome: Progressing   Problem: Elimination: Goal: Will not experience complications related to bowel motility Outcome: Progressing Goal: Will not experience complications related to urinary retention Outcome: Progressing   Problem: Pain Managment: Goal: General experience of comfort will improve and/or be controlled Outcome: Progressing   Problem: Safety: Goal: Ability to remain free from injury will improve Outcome: Progressing   Problem: Skin Integrity: Goal: Risk for impaired skin integrity will decrease Outcome: Progressing   Problem: Education: Goal: Knowledge of disease or condition will improve Outcome: Progressing Goal: Understanding of medication regimen will improve Outcome: Progressing Goal: Individualized Educational Video(s) Outcome: Progressing   Problem: Activity: Goal: Ability to tolerate increased activity will improve Outcome: Progressing   Problem: Cardiac: Goal: Ability to achieve and maintain adequate cardiopulmonary perfusion will  improve Outcome: Progressing   Problem: Health Behavior/Discharge Planning: Goal: Ability to safely manage health-related needs after discharge will improve Outcome: Progressing

## 2024-01-30 NOTE — TOC Progression Note (Signed)
Transition of Care [facility]) - Progression Note    Patient Details  Name: Ryan Long MRN: [id number] Date of Birth: Mar 01, 1961  Transition of [facility]) CM/SW Contact  Nicanor Bake Phone Number: [phone] 01/30/2024, 4:01 PM  Clinical Narrative:  [facility] CSW reviewed pts passr which was approved 2/12. Fl2 completed and SNF faxed out. Offers currently pending.   TOC will continue following.        Barriers to Discharge: Continued Medical Work up  Expected Discharge Plan and Services       Living arrangements for the past 2 months: Boarding House                                       Social Determinants of Health (SDOH) Interventions SDOH Screenings   Food Insecurity: No Food Insecurity (01/24/2024)  Housing: Low Risk  (01/24/2024)  Recent Concern: Housing - Medium Risk (10/26/2023)   Received from [facility]  Transportation Needs: No Transportation Needs (01/24/2024)  Utilities: Not At Risk (01/24/2024)  Alcohol Screen: Low Risk  (07/17/2023)  Depression (PHQ2-9): Low Risk  (08/01/2023)  Financial Resource Strain: Low Risk  (07/17/2023)  Physical Activity: Insufficiently Active (07/17/2023)  Social Connections: Moderately Integrated (07/17/2023)  Stress: Stress Concern Present (07/17/2023)  Tobacco Use: High Risk (01/24/2024)  Health Literacy: Adequate Health Literacy (07/18/2023)    Readmission Risk Interventions     No data to display

## 2024-01-30 NOTE — Progress Notes (Addendum)
   Palliative Medicine Inpatient Follow Up Note HPI: Ryan Long presents today for evaluation of worsening VT. He is a pleasant 63 year old man with a history of hypertension, depression, chronic systolic heart failure, HIV disease, and a history of polysubstance abuse. Palliative care has been asked to get involved in the setting of end stage heart failure.   Today's Discussion 01/30/2024  *Please note that this is a verbal dictation therefore any spelling or grammatical errors are due to the "Dragon Medical One" system interpretation.  Chart reviewed inclusive of vital signs, progress notes, laboratory results, and diagnostic images.   I met with Ryan Long this morning in the presence of his RN, Orlie Pollen. He shares that he is overall feeling anxious. Per his RN he continues to receive ativan as needed which does help symptoms.   Ryan Long feels anxious in the setting of his shortness of breath which he experiences with mobility. He shares he has still been able to get OOB to mobilize.  Discussed patients present abdominal pain. He shares that he has not had a good bowel movement in a number of days. We discussed giving him something to support this better which he is in agreement with.   At this time Ryan Long is aware that we continue to await placement.   Questions and concerns addressed/Palliative Support Provided.   Objective Assessment: Vital Signs Vitals:   01/30/24 0342 01/30/24 0720  BP: 105/73 98/83  Pulse:    Resp: 19 18  Temp: 97.8 F (36.6 C) 98.2 F (36.8 C)  SpO2: 98%     Intake/Output Summary (Last 24 hours) at 01/30/2024 1610 Last data filed at 01/29/2024 1500 Gross per 24 hour  Intake 596 ml  Output --  Net 596 ml   Last Weight  Most recent update: 01/30/2024  3:43 AM    Weight  85 kg (187 lb 6.4 oz)            Gen: Older African-American male in no acute distress HEENT: moist mucous membranes CV: Regular rate and rhythm PULM: On RA, breathing is even and  nonlabored ABD: soft/nontender EXT: No edema Neuro: Alert and oriented x3  SUMMARY OF RECOMMENDATIONS   DNAR/DNI  Appreciate Chaplain Support for existential distress  Awaiting placement with [facility] following  PMT will continue to follow along  Symptoms: Anxiety: - Continue ativan 1mg  IV Q4H PRN  - Add low dose clonazepam TID  Constipation: - Bisacodyl 10mg  PO x1 - Miralax 17gm Daily  MDM - High symptom management review and modification of controlled substances.  ______________________________________________________________________________________ [name] Guys Palliative Medicine Team Team Cell Phone: [phone] Please utilize secure chat with additional questions, if there is no response within 30 minutes please call the above phone number  Palliative Medicine Team providers are available by phone from 7am to 7pm daily and can be reached through the team cell phone.  Should this patient require assistance outside of these hours, please call the patient's attending physician.

## 2024-01-30 NOTE — Progress Notes (Addendum)
Rounding Note    Patient Name: Daine Gunther Date of Encounter: 01/30/2024  [facility] Cardiologist: previously Dr. Rudolpho Sevin AHF: Dr. Gala Romney EP:   Subjective   A little SOB, O2 comfortable, some generalized nausea, + urine out pt "is good", + BM  Inpatient Medications    Scheduled Meds:  amiodarone  400 mg Oral BID   aspirin EC  81 mg Oral Daily   bictegravir-emtricitabine-tenofovir AF  1 tablet Oral Daily   empagliflozin  10 mg Oral QAC breakfast   eplerenone  12.5 mg Oral Daily   finasteride  5 mg Oral Daily   fluticasone furoate-vilanterol  1 puff Inhalation Daily   furosemide  40 mg Oral Daily   heparin  5,000 Units Subcutaneous Q8H   nicotine  14 mg Transdermal Q24H   tamsulosin  0.4 mg Oral BID   valACYclovir  500 mg Oral Daily   Continuous Infusions:  magnesium sulfate bolus IVPB Stopped (01/25/24 1737)   PRN Meds: acetaminophen, albuterol, LORazepam, magnesium hydroxide, morphine injection   Vital Signs    Vitals:   01/29/24 1956 01/29/24 2330 01/30/24 0342 01/30/24 0720  BP: 97/68 93/69 105/73 98/83  Pulse:      Resp: 19 20 19 18   Temp: 97.9 F (36.6 C) 98.2 F (36.8 C) 97.8 F (36.6 C) 98.2 F (36.8 C)  TempSrc: Oral Oral Oral Oral  SpO2: 98% 98% 98%   Weight:   85 kg   Height:        Intake/Output Summary (Last 24 hours) at 01/30/2024 0829 Last data filed at 01/29/2024 1500 Gross per 24 hour  Intake 596 ml  Output --  Net 596 ml      01/30/2024    3:42 AM 01/29/2024    5:14 AM 01/28/2024    4:38 AM  Last 3 Weights  Weight (lbs) 187 lb 6.4 oz 186 lb 14.4 oz 186 lb 1.6 oz  Weight (kg) 85.004 kg 84.777 kg 84.414 kg      Telemetry    SR/ST, mostly 80's-90's, occ to at times frequent PVCs, couplets, c/w rare NSVTs, no sustained arrhythmia  - Personally Reviewed  ECG    No new EKGs  - Personally Reviewed  Physical Exam   Exam is stable GEN: No acute distress.   Neck: No JVD Cardiac: RRR, no murmurs, rubs, or  gallops.  Respiratory: slight crackles at the bases GI: Soft, nontender, non-distended  MS: No edema; No deformity. Neuro:  Nonfocal  Psych: Normal affect   Labs    High Sensitivity Troponin:  No results for input(s): "TROPONINIHS" in the last 720 hours.   Chemistry Recent Labs  Lab 01/25/24 0346 01/26/24 0647 01/28/24 0409 01/29/24 0409 01/30/24 0442  NA 142   < > 137 139 138  K 3.7   < > 3.4* 3.8 3.5  CL 107   < > 104 104 102  CO2 24   < > 23 21* 20*  GLUCOSE 123*   < > 86 88 135*  BUN 12   < > 16 17 14   CREATININE 1.55*   < > 1.57* 1.59* 1.46*  CALCIUM 8.4*   < > 8.3* 8.7* 8.5*  MG 2.0   < > 2.0 2.0 2.0  PROT 5.4*  --   --   --   --   ALBUMIN 2.7*  --   --   --   --   AST 38  --   --   --   --  ALT 44  --   --   --   --   ALKPHOS 58  --   --   --   --   BILITOT 1.5*  --   --   --   --   GFRNONAA 50*   < > 50* 49* 54*  ANIONGAP 11   < > 10 14 16*   < > = values in this interval not displayed.    Lipids No results for input(s): "CHOL", "TRIG", "HDL", "LABVLDL", "LDLCALC", "CHOLHDL" in the last 168 hours.  Hematology Recent Labs  Lab 01/24/24 0434 01/25/24 0346  WBC 3.3* 3.4*  RBC 5.41 5.33  HGB 18.2* 17.8*  HCT 50.9 49.6  MCV 94.1 93.1  MCH 33.6 33.4  MCHC 35.8 35.9  RDW 13.3 13.2  PLT 197 176   Thyroid No results for input(s): "TSH", "FREET4" in the last 168 hours.  BNPNo results for input(s): "BNP", "PROBNP" in the last 168 hours.  DDimer No results for input(s): "DDIMER" in the last 168 hours.   Radiology    No results found.   Cardiac Studies    01/03/2024: R/LHC Findings: Ao =88/68 (78) LV = 86/26 RA = 10 RV = 45/13 PA = 40/28 (34) PCW = 24 Thermo CO/CI = 3.8/1.9 Fick CO/CI = 3.2/1.6 PVR = 3.1 (Fick) 2.7 (TD) Ao sat = 95% PA sat = 64%, 60% PAPi = 1.2   Assessment: 1. Normal coronaries 2. Severe NICM with severe biventricular dysfunction LVEF 25% 3. Elevated filling pressures with low cardiac output   Plan/Discussion:     Admit for HF optimization and assessment for candidacy for advanced therapies.    09/28/23: TTE 1. Left ventricular ejection fraction, by estimation, is 20 to 25%. The  left ventricle has severely decreased function. The left ventricle  demonstrates global hypokinesis. The left ventricular internal cavity size  was mildly to moderately dilated. Left  ventricular diastolic parameters are consistent with Grade I diastolic  dysfunction (impaired relaxation).   2. Right ventricular systolic function is normal. The right ventricular  size is normal. Tricuspid regurgitation signal is inadequate for assessing  PA pressure.   3. The mitral valve is normal in structure. Mild mitral valve  regurgitation.   4. The aortic valve is normal in structure. Aortic valve regurgitation is  not visualized.   5. The inferior vena cava is normal in size with greater than 50%  respiratory variability, suggesting right atrial pressure of 3 mmHg.      09/27/2021: TTE  1. Left ventricular ejection fraction, by estimation, is 20 to 25%. The  left ventricle has severely decreased function. The left ventricle  demonstrates global hypokinesis. The left ventricular internal cavity size  was mildly to moderately dilated. Left  ventricular diastolic parameters are consistent with Grade I diastolic  dysfunction (impaired relaxation).   2. Right ventricular systolic function is normal. The right ventricular  size is normal. Tricuspid regurgitation signal is inadequate for assessing  PA pressure.   3. The mitral valve is normal in structure. Mild mitral valve  regurgitation.   4. The aortic valve is normal in structure. Aortic valve regurgitation is  not visualized.   5. The inferior vena cava is normal in size with greater than 50%  respiratory variability, suggesting right atrial pressure of 3 mmHg.   Comparison(s): No significant change from prior study.      12/13/2021 TTE SUMMARY  CPS 02-26-14 EF has  declined.  Echo contrast study was not performed due  to the patient refusal.  Ectopy during study.  There is mild concentric left ventricular hypertrophy.  The left ventricular size is normal.  LV ejection fraction = 25-30%.  Left ventricular systolic function is severely reduced.  Left ventricular filling pattern is indeterminate.  There is severe global hypokinesis of the left ventricle.  Device lead in the right ventricle  The right ventricle is mildly dilated.  The left atrium is mildly dilated.  The right atrium is mildly dilated.  There is mild mitral regurgitation.  There was insufficient TR detected to calculate RV systolic pressure.  There is a pacemaker lead in the right ventricle.   Last Stress Test  01/04/2022 IMPRESSION:  1. Images are consistent with dilated cardiomyopathy with a fixed  inferior wall defect and no significant reversibility. Of interest is the  fact that there is significant reverse redistribution of the septal wall  and anterior wall which is of unknown clinical significance   2. Calculated ejection fraction 17%   3. Dilated cardiomyopathy with an ejection fraction of 17% calculated and  a fixed defect of the inferior wall without significant reversibility.   Patient Profile     63 y.o. male h/o NICM, chronic sCHF s/p single chamber Abbott ICD, HLD, HIV, substance abuse (ETOH, THC, Cocaine), CKD II & polycythemia  Known hx of VT Long hx of noncompliance complicating his care Last seen in our clinic by myself 11/02/23, lengthy visit, a lot of symptoms, had VT in Oct Discussed meds at length Plan of care with in clinic MD Afterwards reported very unhappy with his care and cancelled all future visits with our team  Admitted 01/03/24 > HF exacerbation > R/LHC, planned for admission/HF magement, left AMA  not felt to be a candidate for destination therapy. He has a history of ongoing tobacco use and noncompliance   ER visit 01/16/24 for ICD shock >  left AMA angry his room didn't have a TV  Admitted this stay feeling poorly > multiple VT episodes, therapies, had stopped his amiodarone Not felt to be overtly volume OL Started on IV amio Device interrogation revealed episodes of monomorphic VT in the 180s. Faster VT's have since been successfully treated with ATP previously.  Added a zone with ATP starting in the 170s.   Device History: Abbott Single Chamber ICD implanted 01/08/2018    + appropriate tx   AAD hx amiodarone, started 03/2023 at [facility]    Assessment & Plan    VT Known for him + NSVTs, has 3 morphologys, mostly slower rates now No sustained VT since 01/24/24 (had ATP then as well) Transitioned to PO amiodarone 01/27/24 (400mg  BID) Occ PVCs, couplets, rare NSVT s Off BB with concerns of low output and his hypotension K+ 3.5 Mag 2.0  QT looks a bit long on telemetry, closely coupled PVCs Korra Christine hold zofran > reduce amio dose. Hopefully he Margrit Minner tolerate lower amiodarone dose OK with need for zofran   NICM Chronic CHF He does not appear volume OL Appreciate HF team input End stage NICM Not a candidate for advanced therapies Palliative care on board > referral to [facility] >> HOSPICE planned for discharge into Hospice facility once insurance/placement finalized Appreciate Palliative/Hospice/Chaplain services DNR/DNI ICD tachy therapies turned off 01/28/24  Creat about baseline Uncertain I/O accuracy Continue jardiance Eplerenone >> Otilio Groleau increase dose for volume and K+  Patient remains weary of taking lasix, worried about his BP, should tolerate a little more diuresis Lungs clear   HIV Home  meds  5. Hospice  DNR/DNI Device tachy-therapies are off  Appreciate palliative/Hospice/SW/TOC teams Awaiting hospice facility/placement  For questions or updates, please contact [facility] Please consult [email or web address] for contact info under        Signed, Sheilah Pigeon, PA-C   01/30/2024, 8:29 AM    I have seen and examined this patient with Francis Dowse.  Agree with above, note added to reflect my findings.  Continue to have PVCs and short runs of nonsustained VT.  Otherwise without prolonged arrhythmia.  Does complain of nausea and abdominal discomfort.  GEN: Well nourished, well developed, in no acute distress  HEENT: normal  Neck: no JVD, carotid bruits, or masses Cardiac: RRR; no murmurs, rubs, or gallops,no edema  Respiratory:  clear to auscultation bilaterally, normal work of breathing GI: soft, nontender, nondistended, + BS MS: no deformity or atrophy  Skin: warm and dry, device site well healed Neuro:  Strength and sensation are intact Psych: euthymic mood, full affect   VT storm: Has had multiple morphologies of VT, mostly at slower rates currently.  On amiodarone.  Dose reduced to 200 mg twice daily due to nausea.  Off heart failure medications due to hypotension. Chronic systolic heart failure: Due to nonischemic cardiomyopathy.  Does not appear volume overloaded but has had continued shortness of breath.  Ferris Tally give 1 dose of IV Lasix.  Continue eplerenone and p.o. Lasix. HIV: Continue home medications Disposition: Plan for discharge with hospice care  Brittay Mogle M. Talmadge Ganas MD 01/30/2024 10:30 AM

## 2024-01-30 NOTE — NC FL2 (Deleted)
[place] MEDICAID FL2 LEVEL OF CARE FORM     IDENTIFICATION  Patient Name: Ryan Long Birthdate: Mar 23, 1961 Sex: male Admission Date (Current Location): 01/23/2024  [facility] and [place] Number:  Producer, television/film/video and Address:  [facility] [place], [address], [place], [place] [address]      Provider Number: [id number]  Attending Physician Name and Address:  Marinus Maw, MD  Relative Name and Phone Number:       Current Level of Care: Hospital Recommended Level of Care: Skilled Nursing Facility Prior Approval Number:    Date Approved/Denied:   PASRR Number: [id number]  Discharge Plan: SNF    Current Diagnoses: Patient Active Problem List   Diagnosis Date Noted   Ventricular tachycardia (HCC) 01/23/2024   Acute on chronic systolic CHF (congestive heart failure) (HCC) 01/03/2024   Asthmatic bronchitis 01/24/2023   Rash and nonspecific skin eruption 12/19/2022   Arm numbness 11/15/2022   Aortic atherosclerosis (HCC) 11/15/2022   Chronic kidney disease 08/08/2022   Healthcare maintenance 08/08/2022   Cigarette smoker 01/26/2022   ETD (Eustachian tube dysfunction), right 01/26/2022   Allergy to pollen 01/26/2022   Polycythemia 12/08/2021   Vitamin D deficiency 12/08/2021   Hip pain 01/06/2021   VT (ventricular tachycardia) (HCC) 08/25/2020   HIV positive (HCC) 07/12/2020   Atrial fibrillation (HCC) 06/10/2020   BPH (benign prostatic hyperplasia) 01/28/2020   HFrEF (heart failure with reduced ejection fraction) (HCC) 01/28/2020   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   Achilles tendinitis, left leg 04/03/2019   AICD (automatic cardioverter/defibrillator) present 08/15/2018   Abnormal EKG 06/28/2018   Adjustment disorder with mixed disturbance of emotions and conduct    NICM (nonischemic cardiomyopathy) (HCC) 01/08/2018   Anxiety 12/20/2017   Bipolar 1 disorder (HCC) 10/10/2017   Chronic systolic heart failure (HCC) 05/13/2017    HSV-2 infection 07/17/2016   History of attempted suicide 04/14/2016   History of alcoholism (HCC) 04/14/2016   History of substance abuse (HCC) 04/14/2016   Constipation 01/17/2016   COPD exacerbation (HCC) 10/04/2015   Abnormal thyroid stimulating hormone (TSH) level 10/04/2015   MDD (major depressive disorder), recurrent episode, severe (HCC) 09/23/2015   Hypertension 09/10/2015   PTSD (post-traumatic stress disorder) 08/07/2015   Primary insomnia 04/02/2015   Herpesviral infection of penis 04/02/2015   Generalized anxiety disorder 05/29/2014   Tobacco use disorder 02/17/2013   Heart failure (HCC) 09/19/2011   Other primary cardiomyopathies 08/24/2011   Human immunodeficiency virus (HIV) disease (HCC) 02/01/2009   Recurrent HSV (herpes simplex virus) 02/01/2009   Hyperlipidemia 02/01/2009   Allergic rhinitis 02/01/2009    Orientation RESPIRATION BLADDER Height & Weight     Self, Time, Situation, Place  Normal Continent Weight: 187 lb 6.4 oz (85 kg) Height:  5\' 8"  (172.7 cm)  BEHAVIORAL SYMPTOMS/MOOD NEUROLOGICAL BOWEL NUTRITION STATUS      Continent Diet (See dc summary)  AMBULATORY STATUS COMMUNICATION OF NEEDS Skin   Limited Assist Verbally Normal                       Personal Care Assistance Level of Assistance  Bathing, Feeding, Dressing, Total care Bathing Assistance: Limited assistance Feeding assistance: Limited assistance Dressing Assistance: Limited assistance Total Care Assistance: Limited assistance   Functional Limitations Info  Sight, Hearing, Speech Sight Info: Impaired Hearing Info: Adequate Speech Info: Adequate    SPECIAL CARE FACTORS FREQUENCY  PT (By licensed PT), OT (By licensed OT)     PT Frequency:  5x weekly OT Frequency: 5x weekly            Contractures      Additional Factors Info  Code Status, Allergies Code Status Info: DNR- Limited Allergies Info: No known allergies           Current Medications (01/30/2024):   This is the current hospital active medication list Current Facility-Administered Medications  Medication Dose Route Frequency Provider Last Rate Last Admin   acetaminophen (TYLENOL) tablet 650 mg  650 mg Oral Q4H PRN Marcelino Duster, PA   650 mg at 01/30/24 1258   albuterol (PROVENTIL) (2.5 MG/3ML) 0.083% nebulizer solution 2.5 mg  2.5 mg Inhalation Q6H PRN Marinus Maw, MD   2.5 mg at 01/29/24 2142   amiodarone (PACERONE) tablet 200 mg  200 mg Oral BID Sheilah Pigeon, PA-C       aspirin EC tablet 81 mg  81 mg Oral Daily Marcelino Duster, [place]   81 mg at 01/30/24 0734   bictegravir-emtricitabine-tenofovir AF (BIKTARVY) 50-200-25 MG per tablet 1 tablet  1 tablet Oral Daily Marcelino Duster, [place]   1 tablet at 01/30/24 1610   clonazepam (KLONOPIN) disintegrating tablet 0.125 mg  0.125 mg Oral TID Ernie Avena, NP   0.125 mg at 01/30/24 1248   empagliflozin (JARDIANCE) tablet 10 mg  10 mg Oral QAC breakfast Marcelino Duster, [place]   10 mg at 01/30/24 0734   [START ON 01/31/2024] eplerenone (INSPRA) tablet 25 mg  25 mg Oral Daily Sheilah Pigeon, PA-C       finasteride (PROSCAR) tablet 5 mg  5 mg Oral Daily Marcelino Duster, [place]   5 mg at 01/30/24 0734   fluticasone furoate-vilanterol (BREO ELLIPTA) 200-25 MCG/ACT 1 puff  1 puff Inhalation Daily Marcelino Duster, PA   1 puff at 01/30/24 0736   furosemide (LASIX) injection 40 mg  40 mg Intravenous Once Ursuy, Renee Lynn, PA-C       heparin injection 5,000 Units  5,000 Units Subcutaneous Q8H Marcelino Duster, [place]   5,000 Units at 01/30/24 1314   LORazepam (ATIVAN) injection 1 mg  1 mg Intravenous Q4H PRN Romie Minus, MD   1 mg at 01/30/24 0347   magnesium hydroxide (MILK OF MAGNESIA) suspension 30 mL  30 mL Oral Daily PRN Marinus Maw, MD   30 mL at 01/30/24 0746   magnesium sulfate IVPB 2 g 50 mL  2 g Intravenous Once Rubye Oaks, MD   Stopped at 01/25/24 1737   morphine (PF) 2 MG/ML injection 2 mg  2 mg  Intravenous Q2H PRN Romie Minus, MD   2 mg at 01/28/24 1551   nicotine (NICODERM CQ - dosed in mg/24 hours) patch 14 mg  14 mg Transdermal Q24H Marcelino Duster, PA   14 mg at 01/30/24 0737   polyethylene glycol (MIRALAX / GLYCOLAX) packet 17 g  17 g Oral Daily Ernie Avena, NP   17 g at 01/30/24 0959   tamsulosin (FLOMAX) capsule 0.4 mg  0.4 mg Oral BID Marcelino Duster, PA   0.4 mg at 01/30/24 9604   valACYclovir (VALTREX) tablet 500 mg  500 mg Oral Daily Marcelino Duster, PA   500 mg at 01/30/24 5409     Discharge Medications: Please see discharge summary for a list of discharge medications.  Relevant Imaging Results:  Relevant Lab Results:   Additional Information SSN: [id number]  Reva Bores, LCSWA

## 2024-01-30 NOTE — Progress Notes (Signed)
Upon entering the patient's room, the patient made me aware that he was using the restroom and while wiping he lost his balance and went forward. He stated he did not feel dizzy or weak, but he lost his balance. He tried to break the fall. He landed on his knees and hands and hit the top of his head on the bedside commode. He states he proceeded to wipe again after getting up and he fell forward again. The second time was without hitting his head. Patient had not visible bruising or open areas. Patient has no limps or swelling. Lendell Caprice MD notified. Please see new orders.  Tamera Stands, RN

## 2024-01-30 NOTE — Significant Event (Signed)
Patient suffered a mechanical fall and hit his head while using the bedside commode. He hit his head on the commode. Seems asymptomatic; however, he proceeded to fall a second time shortly thereafter. Denies dizziness. Will obtain head CT since on anticoagulation.

## 2024-01-30 NOTE — [place] FL2 (Signed)
MEDICAID [id number] LEVEL OF CARE FORM     IDENTIFICATION  Patient Name: Ryan Long Birthdate: [birth year] Sex: male Admission Date (Current Location): 01/23/2024  [facility] [place] Number:  Producer, television/film/video and Address:  [facility] [place]. [facility], [address], [place], [place] [address]      Provider Number: [id number]  Attending Physician Name and Address:  Marinus Maw, MD  Relative Name and Phone Number:       Current Level of Care: Hospital Recommended Level of Care: Skilled Nursing Facility Prior Approval Number:    Date Approved/Denied:   PASRR Number: [id number]  Discharge Plan: SNF    Current Diagnoses: Patient Active Problem List   Diagnosis Date Noted   Ventricular tachycardia (HCC) 01/23/2024   Acute on chronic systolic CHF (congestive heart failure) (HCC) 01/03/2024   Asthmatic bronchitis 01/24/2023   Rash and nonspecific skin eruption 12/19/2022   Arm numbness 11/15/2022   Aortic atherosclerosis (HCC) 11/15/2022   Chronic kidney disease 08/08/2022   Healthcare maintenance 08/08/2022   Cigarette smoker 01/26/2022   ETD (Eustachian tube dysfunction), right 01/26/2022   Allergy to pollen 01/26/2022   Polycythemia 12/08/2021   Vitamin D deficiency 12/08/2021   Hip pain 01/06/2021   VT (ventricular tachycardia) (HCC) 08/25/2020   HIV positive (HCC) 07/12/2020   Atrial fibrillation (HCC) 06/10/2020   BPH (benign prostatic hyperplasia) 01/28/2020   HFrEF (heart failure with reduced ejection fraction) (HCC) 01/28/2020   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   Achilles tendinitis, left leg 04/03/2019   AICD (automatic cardioverter/defibrillator) present 08/15/2018   Abnormal EKG 06/28/2018   Adjustment disorder with mixed disturbance of emotions and conduct    NICM (nonischemic cardiomyopathy) (HCC) 01/08/2018   Anxiety 12/20/2017   Bipolar 1 disorder (HCC) 10/10/2017   Chronic systolic heart failure (HCC) 05/13/2017    HSV-2 infection 07/17/2016   History of attempted suicide 04/14/2016   History of alcoholism (HCC) 04/14/2016   History of substance abuse (HCC) 04/14/2016   Constipation 01/17/2016   COPD exacerbation (HCC) 10/04/2015   Abnormal thyroid stimulating hormone (TSH) level 10/04/2015   MDD (major depressive disorder), recurrent episode, severe (HCC) 09/23/2015   Hypertension 09/10/2015   PTSD (post-traumatic stress disorder) 08/07/2015   Primary insomnia 04/02/2015   Herpesviral infection of penis 04/02/2015   Generalized anxiety disorder 05/29/2014   Tobacco use disorder 02/17/2013   Heart failure (HCC) 09/19/2011   Other primary cardiomyopathies 08/24/2011   Human immunodeficiency virus (HIV) disease (HCC) 02/01/2009   Recurrent HSV (herpes simplex virus) 02/01/2009   Hyperlipidemia 02/01/2009   Allergic rhinitis 02/01/2009    Orientation RESPIRATION BLADDER Height & Weight     Self, Time, Situation, Place  Normal Continent Weight: 187 lb 6.4 oz (85 kg) Height:  5\' 8"  (172.7 cm)  BEHAVIORAL SYMPTOMS/MOOD NEUROLOGICAL BOWEL NUTRITION STATUS      Continent Diet (See dc summary)  AMBULATORY STATUS COMMUNICATION OF NEEDS Skin   Limited Assist Verbally Normal                       Personal Care Assistance Level of Assistance  Bathing, Feeding, Dressing, Total care Bathing Assistance: Limited assistance Feeding assistance: Limited assistance Dressing Assistance: Limited assistance Total Care Assistance: Limited assistance   Functional Limitations Info  Sight, Hearing, Speech Sight Info: Impaired Hearing Info: Adequate Speech Info: Adequate    SPECIAL CARE FACTORS FREQUENCY  PT (By licensed PT), OT (By licensed OT)     PT Frequency:  5x weekly OT Frequency: 5x weekly            Contractures      Additional Factors Info  Code Status, Allergies Code Status Info: DNR- Limited Allergies Info: No known allergies           Current Medications (01/30/2024):   This is the current hospital active medication list Current Facility-Administered Medications  Medication Dose Route Frequency Provider Last Rate Last Admin   acetaminophen (TYLENOL) tablet 650 mg  650 mg Oral Q4H PRN Marcelino Duster, PA   650 mg at 01/30/24 1258   albuterol (PROVENTIL) (2.5 MG/3ML) 0.083% nebulizer solution 2.5 mg  2.5 mg Inhalation Q6H PRN Marinus Maw, MD   2.5 mg at 01/29/24 2142   amiodarone (PACERONE) tablet 200 mg  200 mg Oral BID Sheilah Pigeon, PA-C       aspirin EC tablet 81 mg  81 mg Oral Daily Marcelino Duster, [place]   81 mg at 01/30/24 0734   bictegravir-emtricitabine-tenofovir AF (BIKTARVY) 50-200-25 MG per tablet 1 tablet  1 tablet Oral Daily Marcelino Duster, [place]   1 tablet at 01/30/24 1610   clonazepam (KLONOPIN) disintegrating tablet 0.125 mg  0.125 mg Oral TID Ernie Avena, NP   0.125 mg at 01/30/24 1248   empagliflozin (JARDIANCE) tablet 10 mg  10 mg Oral QAC breakfast Marcelino Duster, [place]   10 mg at 01/30/24 0734   [START ON 01/31/2024] eplerenone (INSPRA) tablet 25 mg  25 mg Oral Daily Sheilah Pigeon, PA-C       finasteride (PROSCAR) tablet 5 mg  5 mg Oral Daily Marcelino Duster, [place]   5 mg at 01/30/24 0734   fluticasone furoate-vilanterol (BREO ELLIPTA) 200-25 MCG/ACT 1 puff  1 puff Inhalation Daily Marcelino Duster, PA   1 puff at 01/30/24 0736   furosemide (LASIX) injection 40 mg  40 mg Intravenous Once Ursuy, Renee Lynn, PA-C       heparin injection 5,000 Units  5,000 Units Subcutaneous Q8H Marcelino Duster, [place]   5,000 Units at 01/30/24 1314   LORazepam (ATIVAN) injection 1 mg  1 mg Intravenous Q4H PRN Romie Minus, MD   1 mg at 01/30/24 0347   magnesium hydroxide (MILK OF MAGNESIA) suspension 30 mL  30 mL Oral Daily PRN Marinus Maw, MD   30 mL at 01/30/24 0746   magnesium sulfate IVPB 2 g 50 mL  2 g Intravenous Once Rubye Oaks, MD   Stopped at 01/25/24 1737   morphine (PF) 2 MG/ML injection 2 mg  2 mg  Intravenous Q2H PRN Romie Minus, MD   2 mg at 01/28/24 1551   nicotine (NICODERM CQ - dosed in mg/24 hours) patch 14 mg  14 mg Transdermal Q24H Marcelino Duster, PA   14 mg at 01/30/24 0737   polyethylene glycol (MIRALAX / GLYCOLAX) packet 17 g  17 g Oral Daily Ernie Avena, NP   17 g at 01/30/24 0959   tamsulosin (FLOMAX) capsule 0.4 mg  0.4 mg Oral BID Marcelino Duster, PA   0.4 mg at 01/30/24 9604   valACYclovir (VALTREX) tablet 500 mg  500 mg Oral Daily Marcelino Duster, PA   500 mg at 01/30/24 5409     Discharge Medications: Please see discharge summary for a list of discharge medications.  Relevant Imaging Results:  Relevant Lab Results:   Additional Information SSN: [id number]  Reva Bores, LCSWA

## 2024-01-30 NOTE — [place] FL2 (Incomplete Revision)
  MEDICAID FL2 LEVEL OF CARE FORM     IDENTIFICATION  Patient Name: Ryan Long Birthdate: [birth year] Sex: male Admission Date (Current Location): 01/23/2024  [facility] [place] Number:  Producer, television/film/video and Address:  [facility] [place]. [facility], [address], [place], [place] [address]      Provider Number: [id number]  Attending Physician Name and Address:  Marinus Maw, MD  Relative Name and Phone Number:       Current Level of Care: Hospital Recommended Level of Care: Skilled Nursing Facility Prior Approval Number:    Date Approved/Denied:   PASRR Number: [id number]  Discharge Plan: SNF    Current Diagnoses: Patient Active Problem List   Diagnosis Date Noted   Ventricular tachycardia (HCC) 01/23/2024   Acute on chronic systolic CHF (congestive heart failure) (HCC) 01/03/2024   Asthmatic bronchitis 01/24/2023   Rash and nonspecific skin eruption 12/19/2022   Arm numbness 11/15/2022   Aortic atherosclerosis (HCC) 11/15/2022   Chronic kidney disease 08/08/2022   Healthcare maintenance 08/08/2022   Cigarette smoker 01/26/2022   ETD (Eustachian tube dysfunction), right 01/26/2022   Allergy to pollen 01/26/2022   Polycythemia 12/08/2021   Vitamin D deficiency 12/08/2021   Hip pain 01/06/2021   VT (ventricular tachycardia) (HCC) 08/25/2020   HIV positive (HCC) 07/12/2020   Atrial fibrillation (HCC) 06/10/2020   BPH (benign prostatic hyperplasia) 01/28/2020   HFrEF (heart failure with reduced ejection fraction) (HCC) 01/28/2020   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   Achilles tendinitis, left leg 04/03/2019   AICD (automatic cardioverter/defibrillator) present 08/15/2018   Abnormal EKG 06/28/2018   Adjustment disorder with mixed disturbance of emotions and conduct    NICM (nonischemic cardiomyopathy) (HCC) 01/08/2018   Anxiety 12/20/2017   Bipolar 1 disorder (HCC) 10/10/2017   Chronic systolic heart failure (HCC) 05/13/2017    HSV-2 infection 07/17/2016   History of attempted suicide 04/14/2016   History of alcoholism (HCC) 04/14/2016   History of substance abuse (HCC) 04/14/2016   Constipation 01/17/2016   COPD exacerbation (HCC) 10/04/2015   Abnormal thyroid stimulating hormone (TSH) level 10/04/2015   MDD (major depressive disorder), recurrent episode, severe (HCC) 09/23/2015   Hypertension 09/10/2015   PTSD (post-traumatic stress disorder) 08/07/2015   Primary insomnia 04/02/2015   Herpesviral infection of penis 04/02/2015   Generalized anxiety disorder 05/29/2014   Tobacco use disorder 02/17/2013   Heart failure (HCC) 09/19/2011   Other primary cardiomyopathies 08/24/2011   Human immunodeficiency virus (HIV) disease (HCC) 02/01/2009   Recurrent HSV (herpes simplex virus) 02/01/2009   Hyperlipidemia 02/01/2009   Allergic rhinitis 02/01/2009    Orientation RESPIRATION BLADDER Height & Weight     Self, Time, Situation, Place  Normal Continent Weight: 187 lb 6.4 oz (85 kg) Height:  5\' 8"  (172.7 cm)  BEHAVIORAL SYMPTOMS/MOOD NEUROLOGICAL BOWEL NUTRITION STATUS      Continent Diet (See dc summary)  AMBULATORY STATUS COMMUNICATION OF NEEDS Skin   Limited Assist Verbally Normal                       Personal Care Assistance Level of Assistance  Bathing, Feeding, Dressing, Total care Bathing Assistance: Limited assistance Feeding assistance: Limited assistance Dressing Assistance: Limited assistance Total Care Assistance: Limited assistance   Functional Limitations Info  Sight, Hearing, Speech Sight Info: Impaired Hearing Info: Adequate Speech Info: Adequate    SPECIAL CARE FACTORS FREQUENCY  PT (By licensed PT), OT (By licensed OT)     PT Frequency:  5x weekly OT Frequency: 5x weekly            Contractures      Additional Factors Info  Code Status, Allergies Code Status Info: DNR- Limited Allergies Info: No known allergies           Current Medications (01/30/2024):   This is the current hospital active medication list Current Facility-Administered Medications  Medication Dose Route Frequency Provider Last Rate Last Admin   acetaminophen (TYLENOL) tablet 650 mg  650 mg Oral Q4H PRN Marcelino Duster, PA   650 mg at 01/30/24 1258   albuterol (PROVENTIL) (2.5 MG/3ML) 0.083% nebulizer solution 2.5 mg  2.5 mg Inhalation Q6H PRN Marinus Maw, MD   2.5 mg at 01/29/24 2142   amiodarone (PACERONE) tablet 200 mg  200 mg Oral BID Sheilah Pigeon, PA-C       aspirin EC tablet 81 mg  81 mg Oral Daily Marcelino Duster, [place]   81 mg at 01/30/24 0734   bictegravir-emtricitabine-tenofovir AF (BIKTARVY) 50-200-25 MG per tablet 1 tablet  1 tablet Oral Daily Marcelino Duster, [place]   1 tablet at 01/30/24 1610   clonazepam (KLONOPIN) disintegrating tablet 0.125 mg  0.125 mg Oral TID Ernie Avena, NP   0.125 mg at 01/30/24 1248   empagliflozin (JARDIANCE) tablet 10 mg  10 mg Oral QAC breakfast Marcelino Duster, [place]   10 mg at 01/30/24 0734   [START ON 01/31/2024] eplerenone (INSPRA) tablet 25 mg  25 mg Oral Daily Sheilah Pigeon, PA-C       finasteride (PROSCAR) tablet 5 mg  5 mg Oral Daily Marcelino Duster, [place]   5 mg at 01/30/24 0734   fluticasone furoate-vilanterol (BREO ELLIPTA) 200-25 MCG/ACT 1 puff  1 puff Inhalation Daily Marcelino Duster, PA   1 puff at 01/30/24 0736   furosemide (LASIX) injection 40 mg  40 mg Intravenous Once Ursuy, Renee Lynn, PA-C       heparin injection 5,000 Units  5,000 Units Subcutaneous Q8H Marcelino Duster, [place]   5,000 Units at 01/30/24 1314   LORazepam (ATIVAN) injection 1 mg  1 mg Intravenous Q4H PRN Romie Minus, MD   1 mg at 01/30/24 0347   magnesium hydroxide (MILK OF MAGNESIA) suspension 30 mL  30 mL Oral Daily PRN Marinus Maw, MD   30 mL at 01/30/24 0746   magnesium sulfate IVPB 2 g 50 mL  2 g Intravenous Once Rubye Oaks, MD   Stopped at 01/25/24 1737   morphine (PF) 2 MG/ML injection 2 mg  2 mg  Intravenous Q2H PRN Romie Minus, MD   2 mg at 01/28/24 1551   nicotine (NICODERM CQ - dosed in mg/24 hours) patch 14 mg  14 mg Transdermal Q24H Marcelino Duster, PA   14 mg at 01/30/24 0737   polyethylene glycol (MIRALAX / GLYCOLAX) packet 17 g  17 g Oral Daily Ernie Avena, NP   17 g at 01/30/24 0959   tamsulosin (FLOMAX) capsule 0.4 mg  0.4 mg Oral BID Marcelino Duster, PA   0.4 mg at 01/30/24 9604   valACYclovir (VALTREX) tablet 500 mg  500 mg Oral Daily Marcelino Duster, PA   500 mg at 01/30/24 5409     Discharge Medications: Please see discharge summary for a list of discharge medications.  Relevant Imaging Results:  Relevant Lab Results:   Additional Information SSN: [id number]  Reva Bores, LCSWA

## 2024-01-30 NOTE — Progress Notes (Signed)
Mobility Specialist Progress Note;   01/30/24 1450  Mobility  Activity Ambulated with assistance in hallway  Level of Assistance Standby assist, set-up cues, supervision of patient - no hands on  Assistive Device Four wheel walker  Distance Ambulated (ft) 400 ft  Activity Response Tolerated well  Mobility Referral Yes  Mobility visit 1 Mobility  Mobility Specialist Start Time (ACUTE ONLY) 1450  Mobility Specialist Stop Time (ACUTE ONLY) 1510  Mobility Specialist Time Calculation (min) (ACUTE ONLY) 20 min   Pt eager for mobility. Required no physical assistance during ambulation. Took 3x seated rest breaks d/t fatigue. However, VSS throughout. Pt returned safely back to bed with all needs met.   Caesar Bookman Mobility Specialist Please contact via SecureChat or [employer] [phone]

## 2024-01-31 ENCOUNTER — Other Ambulatory Visit: Payer: 59

## 2024-01-31 ENCOUNTER — Inpatient Hospital Stay (HOSPITAL_COMMUNITY): Payer: 59

## 2024-01-31 DIAGNOSIS — I472 Ventricular tachycardia, unspecified: Secondary | ICD-10-CM | POA: Diagnosis not present

## 2024-01-31 LAB — BASIC METABOLIC PANEL
Anion gap: 11 (ref 5–15)
BUN: 10 mg/dL (ref 8–23)
CO2: 21 mmol/L — ABNORMAL LOW (ref 22–32)
Calcium: 8.4 mg/dL — ABNORMAL LOW (ref 8.9–10.3)
Chloride: 106 mmol/L (ref 98–111)
Creatinine, Ser: 1.42 mg/dL — ABNORMAL HIGH (ref 0.61–1.24)
GFR, Estimated: 56 mL/min — ABNORMAL LOW (ref >60)
Glucose, Bld: 91 mg/dL (ref 70–99)
Potassium: 3.9 mmol/L (ref 3.5–5.1)
Sodium: 138 mmol/L (ref 135–145)

## 2024-01-31 LAB — MAGNESIUM: Magnesium: 2.2 mg/dL (ref 1.7–2.4)

## 2024-01-31 MED ORDER — DRONABINOL 2.5 MG PO CAPS
5.0000 mg | ORAL_CAPSULE | Freq: Two times a day (BID) | ORAL | Status: DC
Start: 2024-01-31 — End: 2024-02-08
  Administered 2024-01-31 – 2024-02-08 (×18): 5 mg via ORAL
  Filled 2024-01-31 (×18): qty 2

## 2024-01-31 MED ORDER — MIDODRINE HCL 5 MG PO TABS
5.0000 mg | ORAL_TABLET | Freq: Three times a day (TID) | ORAL | Status: DC
Start: 2024-01-31 — End: 2024-02-01
  Administered 2024-01-31 (×3): 5 mg via ORAL
  Filled 2024-01-31 (×3): qty 1

## 2024-01-31 NOTE — Plan of Care (Signed)
  Problem: Education: Goal: Knowledge of General Education information will improve Description: Including pain rating scale, medication(s)/side effects and non-pharmacologic comfort measures Outcome: Progressing   Problem: Health Behavior/Discharge Planning: Goal: Ability to manage health-related needs will improve Outcome: Progressing   Problem: Clinical Measurements: Goal: Respiratory complications will improve Outcome: Progressing Goal: Cardiovascular complication will be avoided Outcome: Progressing   Problem: Pain Managment: Goal: General experience of comfort will improve and/or be controlled Outcome: Progressing

## 2024-01-31 NOTE — Progress Notes (Signed)
Patient refuses bed alarm.  Education provided to patient.  Patient assured this nurse he would call out for standby assistance when ambulating to restroom.

## 2024-01-31 NOTE — TOC Progression Note (Addendum)
Transition of Care [facility]) - Progression Note    Patient Details  Name: Ryan Long MRN: [id number] Date of Birth: 12/01/1961  [facility] CM/SW Contact  Delilah Shan, LCSWA Phone Number: 01/31/2024, 3:44 PM  Clinical Narrative:      No current SNF LTC bed offers. [facility] and [facility] reviewing to see if they can offer LTC bed. CSW updated patient at bedside.CSW will continue to follow.   Barriers to Discharge: Continued Medical Work up  Expected Discharge Plan and Services       Living arrangements for the past 2 months: Boarding House                                       Social Determinants of Health (SDOH) Interventions SDOH Screenings   Food Insecurity: No Food Insecurity (01/24/2024)  Housing: Low Risk  (01/24/2024)  Recent Concern: Housing - Medium Risk (10/26/2023)   Received from [facility]  Transportation Needs: No Transportation Needs (01/24/2024)  Utilities: Not At Risk (01/24/2024)  Alcohol Screen: Low Risk  (07/17/2023)  Depression (PHQ2-9): Low Risk  (08/01/2023)  Financial Resource Strain: Low Risk  (07/17/2023)  Physical Activity: Insufficiently Active (07/17/2023)  Social Connections: Moderately Integrated (07/17/2023)  Stress: Stress Concern Present (07/17/2023)  Tobacco Use: High Risk (01/24/2024)  Health Literacy: Adequate Health Literacy (07/18/2023)    Readmission Risk Interventions     No data to display

## 2024-01-31 NOTE — Progress Notes (Addendum)
Rounding Note    Patient Name: Ryan Long Date of Encounter: 01/31/2024  [facility] Cardiologist: previously Dr. Rudolpho Sevin AHF: Dr. Gala Romney EP:   Subjective   A little SOB, O2 comfortable, some generalized nausea, + urine out pt "is good", + BM  Inpatient Medications    Scheduled Meds:  amiodarone  200 mg Oral BID   aspirin EC  81 mg Oral Daily   bictegravir-emtricitabine-tenofovir AF  1 tablet Oral Daily   clonazepam  0.125 mg Oral TID   dronabinol  5 mg Oral BID AC   empagliflozin  10 mg Oral QAC breakfast   eplerenone  25 mg Oral Daily   finasteride  5 mg Oral Daily   fluticasone furoate-vilanterol  1 puff Inhalation Daily   furosemide  40 mg Intravenous Once   heparin  5,000 Units Subcutaneous Q8H   midodrine  5 mg Oral TID WC   nicotine  14 mg Transdermal Q24H   polyethylene glycol  17 g Oral Daily   tamsulosin  0.4 mg Oral BID   valACYclovir  500 mg Oral Daily   Continuous Infusions:  magnesium sulfate bolus IVPB Stopped (01/25/24 1737)   PRN Meds: acetaminophen, albuterol, LORazepam, magnesium hydroxide, morphine injection   Vital Signs    Vitals:   01/30/24 2247 01/31/24 0455 01/31/24 0736 01/31/24 0920  BP: (!) 89/76 94/75    Pulse: (!) 103 (!) 101 77   Resp:  16 17   Temp:  97.9 F (36.6 C) 97.6 F (36.4 C)   TempSrc:  Oral Oral   SpO2: 97% 99%  98%  Weight:  85.5 kg    Height:        Intake/Output Summary (Last 24 hours) at 01/31/2024 1100 Last data filed at 01/31/2024 0900 Gross per 24 hour  Intake 360 ml  Output --  Net 360 ml      01/31/2024    4:55 AM 01/30/2024    3:42 AM 01/29/2024    5:14 AM  Last 3 Weights  Weight (lbs) 188 lb 9.6 oz 187 lb 6.4 oz 186 lb 14.4 oz  Weight (kg) 85.548 kg 85.004 kg 84.777 kg      Telemetry    unchanged SR/ST, mostly 80's-90's, occ to at times frequent PVCs, couplets, c/w rare NSVTs, no sustained arrhythmias  - Personally Reviewed  ECG    No new EKGs  - Personally  Reviewed  Physical Exam   Ambulating in the room, denies dizziness GEN: No acute distress.   Neck: No JVD Cardiac: RRR, no murmurs, rubs, or gallops.  Respiratory: more clear this AM GI: Soft, nontender, non-distended  MS: No edema; No deformity. Neuro:  Nonfocal  Psych: Normal affect   Labs    High Sensitivity Troponin:  No results for input(s): "TROPONINIHS" in the last 720 hours.   Chemistry Recent Labs  Lab 01/25/24 0346 01/26/24 0647 01/29/24 0409 01/30/24 0442 01/31/24 0448  NA 142   < > 139 138 138  K 3.7   < > 3.8 3.5 3.9  CL 107   < > 104 102 106  CO2 24   < > 21* 20* 21*  GLUCOSE 123*   < > 88 135* 91  BUN 12   < > 17 14 10   CREATININE 1.55*   < > 1.59* 1.46* 1.42*  CALCIUM 8.4*   < > 8.7* 8.5* 8.4*  MG 2.0   < > 2.0 2.0 2.2  PROT 5.4*  --   --   --   --  ALBUMIN 2.7*  --   --   --   --   AST 38  --   --   --   --   ALT 44  --   --   --   --   ALKPHOS 58  --   --   --   --   BILITOT 1.5*  --   --   --   --   GFRNONAA 50*   < > 49* 54* 56*  ANIONGAP 11   < > 14 16* 11   < > = values in this interval not displayed.    Lipids No results for input(s): "CHOL", "TRIG", "HDL", "LABVLDL", "LDLCALC", "CHOLHDL" in the last 168 hours.  Hematology Recent Labs  Lab 01/25/24 0346  WBC 3.4*  RBC 5.33  HGB 17.8*  HCT 49.6  MCV 93.1  MCH 33.4  MCHC 35.9  RDW 13.2  PLT 176   Thyroid No results for input(s): "TSH", "FREET4" in the last 168 hours.  BNPNo results for input(s): "BNP", "PROBNP" in the last 168 hours.  DDimer No results for input(s): "DDIMER" in the last 168 hours.   Radiology    CT HEAD WO CONTRAST ( ) Result Date: 01/31/2024 CLINICAL DATA:  Head trauma, abnormal mental status (Age 26-64y) Larey Seat and hit head EXAM: CT HEAD WITHOUT CONTRAST TECHNIQUE: Contiguous axial images were obtained from the base of the skull through the vertex without intravenous contrast. RADIATION DOSE REDUCTION: This exam was performed according to the departmental  dose-optimization program which includes automated exposure control, adjustment of the mA and/or kV according to patient size and/or use of iterative reconstruction technique. COMPARISON:  CT head 12/21/2022. FINDINGS: Brain: No evidence of acute infarction, hemorrhage, hydrocephalus, extra-axial collection or mass lesion/mass effect. Vascular: No hyperdense vessel or unexpected calcification. Skull: No acute fracture. Sinuses/Orbits: Mostly clear sinuses.  No acute orbital findings. Other: No mastoid effusions. IMPRESSION: No evidence of acute intracranial abnormality. Electronically Signed   By: Feliberto Harts M.D.   On: 01/31/2024 02:18     Cardiac Studies    01/03/2024: R/LHC Findings: Ao =88/68 (78) LV = 86/26 RA = 10 RV = 45/13 PA = 40/28 (34) PCW = 24 Thermo CO/CI = 3.8/1.9 Fick CO/CI = 3.2/1.6 PVR = 3.1 (Fick) 2.7 (TD) Ao sat = 95% PA sat = 64%, 60% PAPi = 1.2   Assessment: 1. Normal coronaries 2. Severe NICM with severe biventricular dysfunction LVEF 25% 3. Elevated filling pressures with low cardiac output   Plan/Discussion:    Admit for HF optimization and assessment for candidacy for advanced therapies.    09/28/23: TTE 1. Left ventricular ejection fraction, by estimation, is 20 to 25%. The  left ventricle has severely decreased function. The left ventricle  demonstrates global hypokinesis. The left ventricular internal cavity size  was mildly to moderately dilated. Left  ventricular diastolic parameters are consistent with Grade I diastolic  dysfunction (impaired relaxation).   2. Right ventricular systolic function is normal. The right ventricular  size is normal. Tricuspid regurgitation signal is inadequate for assessing  PA pressure.   3. The mitral valve is normal in structure. Mild mitral valve  regurgitation.   4. The aortic valve is normal in structure. Aortic valve regurgitation is  not visualized.   5. The inferior vena cava is normal in size with  greater than 50%  respiratory variability, suggesting right atrial pressure of 3 mmHg.      09/27/2021: TTE  1. Left ventricular ejection fraction,  by estimation, is 20 to 25%. The  left ventricle has severely decreased function. The left ventricle  demonstrates global hypokinesis. The left ventricular internal cavity size  was mildly to moderately dilated. Left  ventricular diastolic parameters are consistent with Grade I diastolic  dysfunction (impaired relaxation).   2. Right ventricular systolic function is normal. The right ventricular  size is normal. Tricuspid regurgitation signal is inadequate for assessing  PA pressure.   3. The mitral valve is normal in structure. Mild mitral valve  regurgitation.   4. The aortic valve is normal in structure. Aortic valve regurgitation is  not visualized.   5. The inferior vena cava is normal in size with greater than 50%  respiratory variability, suggesting right atrial pressure of 3 mmHg.   Comparison(s): No significant change from prior study.      12/13/2021 TTE SUMMARY  CPS 02-26-14 EF has declined.  Echo contrast study was not performed due to the patient refusal.  Ectopy during study.  There is mild concentric left ventricular hypertrophy.  The left ventricular size is normal.  LV ejection fraction = 25-30%.  Left ventricular systolic function is severely reduced.  Left ventricular filling pattern is indeterminate.  There is severe global hypokinesis of the left ventricle.  Device lead in the right ventricle  The right ventricle is mildly dilated.  The left atrium is mildly dilated.  The right atrium is mildly dilated.  There is mild mitral regurgitation.  There was insufficient TR detected to calculate RV systolic pressure.  There is a pacemaker lead in the right ventricle.   Last Stress Test  01/04/2022 IMPRESSION:  1. Images are consistent with dilated cardiomyopathy with a fixed  inferior wall defect and no  significant reversibility. Of interest is the  fact that there is significant reverse redistribution of the septal wall  and anterior wall which is of unknown clinical significance   2. Calculated ejection fraction 17%   3. Dilated cardiomyopathy with an ejection fraction of 17% calculated and  a fixed defect of the inferior wall without significant reversibility.   Patient Profile     63 y.o. male h/o NICM, chronic sCHF s/p single chamber Abbott ICD, HLD, HIV, substance abuse (ETOH, THC, Cocaine), CKD II & polycythemia  Known hx of VT Long hx of noncompliance complicating his care Last seen in our clinic by myself 11/02/23, lengthy visit, a lot of symptoms, had VT in Oct Discussed meds at length Plan of care with in clinic MD Afterwards reported very unhappy with his care and cancelled all future visits with our team  Admitted 01/03/24 > HF exacerbation > R/LHC, planned for admission/HF magement, left AMA  not felt to be a candidate for destination therapy. He has a history of ongoing tobacco use and noncompliance   ER visit 01/16/24 for ICD shock > left AMA angry his room didn't have a TV  Admitted this stay feeling poorly > multiple VT episodes, therapies, had stopped his amiodarone Not felt to be overtly volume OL Started on IV amio Device interrogation revealed episodes of monomorphic VT in the 180s. Faster VT's have since been successfully treated with ATP previously.  Added a zone with ATP starting in the 170s.   Device History: Abbott Single Chamber ICD implanted 01/08/2018    + appropriate tx   AAD hx amiodarone, started 03/2023 at [facility]    Assessment & Plan    VT Known for him + NSVTs, has 3 morphologys, mostly slower rates now No sustained  VT since 01/24/24 (had ATP then as well) Transitioned to PO amiodarone 01/27/24 (400mg  BID) Occ PVCs, couplets, rare NSVT s Off BB with concerns of low output and his hypotension K+ 3.5 Mag 2.0  QT looks a bit long on telemetry,  closely coupled PVCs Azzure Garabedian hold zofran > reduce amio dose. Hopefully he Tiernan Millikin tolerate lower amiodarone dose OK with need for zofran   NICM Chronic CHF He does not appear volume OL Appreciate HF team input End stage NICM Not a candidate for advanced therapies Palliative care on board > referral to [facility] >> HOSPICE planned for discharge into Hospice facility once insurance/placement finalized Appreciate Palliative/Hospice/Chaplain services DNR/DNI ICD tachy therapies turned off 01/28/24  Creat about baseline Uncertain I/O accuracy Continue jardiance Eplerenone >> Meghen Akopyan increase dose for volume and K+  BPs marginal > high 80's back 90's this AM Had 2 falls last night  1st after BM as he stood up fell forward, denies syncope then again shortly after, reports just got weak, again denied syncope, did not feel lightheaded/dizzy  Andre Swander add midodrine CT head stable without acute findings No sustained VT associated with fall last night  HIV Home meds  5. Hospice  DNR/DNI Device tachy-therapies are off  Appreciate palliative/Hospice/SW/TOC teams Awaiting hospice facility/placement (previously living at a group home)  6. Poor appetite Pt denies ongoing nausea, says he tries to eat, but nothing tastes good D/w pharmacy options for appetite stim > recommended we try Marinol    For questions or updates, please contact [facility] Please consult [email or web address] for contact info under        Signed, Sheilah Pigeon, PA-C  01/31/2024, 11:00 AM     I have seen and examined this patient with Francis Dowse.  Agree with above, note added to reflect my findings.  Generalized nausea but otherwise no acute complaints.  Mild shortness of breath.  GEN: Well nourished, well developed, in no acute distress  HEENT: normal  Neck: no JVD, carotid bruits, or masses Cardiac: RRR; no murmurs, rubs, or gallops,no edema  Respiratory:  clear to auscultation bilaterally,  normal work of breathing GI: soft, nontender, nondistended, + BS MS: no deformity or atrophy  Skin: warm and dry, device site well healed Neuro:  Strength and sensation are intact Psych: euthymic mood, full affect   Ventricular tachycardia: Has had multiple morphologies.  Currently on amiodarone.  No further ventricular tachycardia.  Continue with current management. Chronic systolic heart failure: Due to nonischemic cardiomyopathy.  Not a candidate for advanced therapies.  Plan for discharge home with hospice.  ICD therapies off.  Due to low blood pressures, Latina Frank add midodrine 5 mg. HIV: Continue home medicines  Inez Stantz M. Dreyton Roessner MD 01/31/2024 12:13 PM

## 2024-01-31 NOTE — Progress Notes (Signed)
Mobility Specialist Progress Note;   01/31/24 1500  Mobility  Activity Refused mobility   Pt politely declining mobility at this time d/t low BP and recent fall. Pt left in bed with all needs met, alarm on. Will f/u as schedule permits.   Caesar Bookman Mobility Specialist Please contact via SecureChat or [employer] [phone]

## 2024-01-31 NOTE — Progress Notes (Signed)
Patient complains of left lower abdominal pain. This pain abruptly began 01/30/2024 at 0700. Patient states the pain is worst before he has a bowel movement and it decreases a little after bowel movements. Bladder scan was done. Scan showed >186. Patient has bruising on lowe abdomen that was not present 01/29/2024 on assessment. But, Patient is receiving subcutaneous heparin. Scan was done after a bowel and urine void. Finding reported to day shift nurse.  Tamera Stands RN

## 2024-02-01 DIAGNOSIS — I472 Ventricular tachycardia, unspecified: Secondary | ICD-10-CM | POA: Diagnosis not present

## 2024-02-01 MED ORDER — MIDODRINE HCL 5 MG PO TABS
10.0000 mg | ORAL_TABLET | Freq: Three times a day (TID) | ORAL | Status: DC
  Administered 2024-02-01 – 2024-02-08 (×22): 10 mg via ORAL
  Filled 2024-02-01 (×22): qty 2

## 2024-02-01 MED ORDER — ALBUTEROL SULFATE (2.5 MG/3ML) 0.083% IN NEBU
2.5000 mg | INHALATION_SOLUTION | Freq: Four times a day (QID) | RESPIRATORY_TRACT | Status: DC
  Administered 2024-02-01: 2.5 mg via RESPIRATORY_TRACT
  Filled 2024-02-01: qty 3

## 2024-02-01 NOTE — Plan of Care (Signed)

## 2024-02-01 NOTE — Progress Notes (Addendum)
Rounding Note    Patient Name: Ryan Long Date of Encounter: 02/01/2024  [facility] Cardiologist: previously Dr. Rudolpho Sevin AHF: Dr. Gala Romney EP:   Subjective   Less SOB, off O2, a few flutters last night No dizziness, no falls, felt better yesterday  Inpatient Medications    Scheduled Meds:  amiodarone  200 mg Oral BID   aspirin EC  81 mg Oral Daily   bictegravir-emtricitabine-tenofovir AF  1 tablet Oral Daily   clonazepam  0.125 mg Oral TID   dronabinol  5 mg Oral BID AC   empagliflozin  10 mg Oral QAC breakfast   eplerenone  25 mg Oral Daily   finasteride  5 mg Oral Daily   fluticasone furoate-vilanterol  1 puff Inhalation Daily   furosemide  40 mg Intravenous Once   heparin  5,000 Units Subcutaneous Q8H   midodrine  5 mg Oral TID WC   nicotine  14 mg Transdermal Q24H   polyethylene glycol  17 g Oral Daily   tamsulosin  0.4 mg Oral BID   valACYclovir  500 mg Oral Daily   Continuous Infusions:  magnesium sulfate bolus IVPB Stopped (01/25/24 1737)   PRN Meds: acetaminophen, albuterol, LORazepam, magnesium hydroxide, morphine injection   Vital Signs    Vitals:   02/01/24 0300 02/01/24 0500 02/01/24 0732 02/01/24 0742  BP: 114/87  94/78   Pulse: 70  99   Resp: 18  18   Temp: 98.4 F (36.9 C)  97.7 F (36.5 C)   TempSrc: Oral  Oral   SpO2: 96%  96% 96%  Weight:  85.9 kg    Height:        Intake/Output Summary (Last 24 hours) at 02/01/2024 0910 Last data filed at 01/31/2024 1422 Gross per 24 hour  Intake 240 ml  Output --  Net 240 ml      02/01/2024    5:00 AM 01/31/2024    4:55 AM 01/30/2024    3:42 AM  Last 3 Weights  Weight (lbs) 189 lb 4.8 oz 188 lb 9.6 oz 187 lb 6.4 oz  Weight (kg) 85.866 kg 85.548 kg 85.004 kg      Telemetry    unchanged SR/ST, mostly 80's-90's, occ to at times frequent PVCs, couplets, c/w rare NSVTs, no sustained arrhythmias  - Personally Reviewed  ECG    No new EKGs  - Personally  Reviewed  Physical Exam   GEN: No acute distress.   Neck: No JVD Cardiac: RRR, no murmurs, rubs, or gallops.  Respiratory: remains more clear this AM GI: Soft, nontender, non-distended  MS: No edema; No deformity. Neuro:  Nonfocal  Psych: Normal affect   Labs    High Sensitivity Troponin:  No results for input(s): "TROPONINIHS" in the last 720 hours.   Chemistry Recent Labs  Lab 01/29/24 0409 01/30/24 0442 01/31/24 0448  NA 139 138 138  K 3.8 3.5 3.9  CL 104 102 106  CO2 21* 20* 21*  GLUCOSE 88 135* 91  BUN 17 14 10   CREATININE 1.59* 1.46* 1.42*  CALCIUM 8.7* 8.5* 8.4*  MG 2.0 2.0 2.2  GFRNONAA 49* 54* 56*  ANIONGAP 14 16* 11    Lipids No results for input(s): "CHOL", "TRIG", "HDL", "LABVLDL", "LDLCALC", "CHOLHDL" in the last 168 hours.  Hematology No results for input(s): "WBC", "RBC", "HGB", "HCT", "MCV", "MCH", "MCHC", "RDW", "PLT" in the last 168 hours.  Thyroid No results for input(s): "TSH", "FREET4" in the last 168 hours.  BNPNo results  for input(s): "BNP", "PROBNP" in the last 168 hours.  DDimer No results for input(s): "DDIMER" in the last 168 hours.   Radiology    CT HEAD WO CONTRAST ( ) Result Date: 01/31/2024 CLINICAL DATA:  Head trauma, abnormal mental status (Age 8-64y) Larey Seat and hit head EXAM: CT HEAD WITHOUT CONTRAST TECHNIQUE: Contiguous axial images were obtained from the base of the skull through the vertex without intravenous contrast. RADIATION DOSE REDUCTION: This exam was performed according to the departmental dose-optimization program which includes automated exposure control, adjustment of the mA and/or kV according to patient size and/or use of iterative reconstruction technique. COMPARISON:  CT head 12/21/2022. FINDINGS: Brain: No evidence of acute infarction, hemorrhage, hydrocephalus, extra-axial collection or mass lesion/mass effect. Vascular: No hyperdense vessel or unexpected calcification. Skull: No acute fracture. Sinuses/Orbits:  Mostly clear sinuses.  No acute orbital findings. Other: No mastoid effusions. IMPRESSION: No evidence of acute intracranial abnormality. Electronically Signed   By: Feliberto Harts LongD.   On: 01/31/2024 02:18     Cardiac Studies    01/03/2024: R/LHC Findings: Ao =88/68 (78) LV = 86/26 RA = 10 RV = 45/13 PA = 40/28 (34) PCW = 24 Thermo CO/CI = 3.8/1.9 Fick CO/CI = 3.2/1.6 PVR = 3.1 (Fick) 2.7 (TD) Ao sat = 95% PA sat = 64%, 60% PAPi = 1.2   Assessment: 1. Normal coronaries 2. Severe NICM with severe biventricular dysfunction LVEF 25% 3. Elevated filling pressures with low cardiac output   Plan/Discussion:    Admit for HF optimization and assessment for candidacy for advanced therapies.    09/28/23: TTE 1. Left ventricular ejection fraction, by estimation, is 20 to 25%. The  left ventricle has severely decreased function. The left ventricle  demonstrates global hypokinesis. The left ventricular internal cavity size  was mildly to moderately dilated. Left  ventricular diastolic parameters are consistent with Grade I diastolic  dysfunction (impaired relaxation).   2. Right ventricular systolic function is normal. The right ventricular  size is normal. Tricuspid regurgitation signal is inadequate for assessing  PA pressure.   3. The mitral valve is normal in structure. Mild mitral valve  regurgitation.   4. The aortic valve is normal in structure. Aortic valve regurgitation is  not visualized.   5. The inferior vena cava is normal in size with greater than 50%  respiratory variability, suggesting right atrial pressure of 3 mmHg.      09/27/2021: TTE  1. Left ventricular ejection fraction, by estimation, is 20 to 25%. The  left ventricle has severely decreased function. The left ventricle  demonstrates global hypokinesis. The left ventricular internal cavity size  was mildly to moderately dilated. Left  ventricular diastolic parameters are consistent with Grade I  diastolic  dysfunction (impaired relaxation).   2. Right ventricular systolic function is normal. The right ventricular  size is normal. Tricuspid regurgitation signal is inadequate for assessing  PA pressure.   3. The mitral valve is normal in structure. Mild mitral valve  regurgitation.   4. The aortic valve is normal in structure. Aortic valve regurgitation is  not visualized.   5. The inferior vena cava is normal in size with greater than 50%  respiratory variability, suggesting right atrial pressure of 3 mmHg.   Comparison(s): No significant change from prior study.      12/13/2021 TTE SUMMARY  CPS 02-26-14 EF has declined.  Echo contrast study was not performed due to the patient refusal.  Ectopy during study.  There is mild concentric left  ventricular hypertrophy.  The left ventricular size is normal.  LV ejection fraction = 25-30%.  Left ventricular systolic function is severely reduced.  Left ventricular filling pattern is indeterminate.  There is severe global hypokinesis of the left ventricle.  Device lead in the right ventricle  The right ventricle is mildly dilated.  The left atrium is mildly dilated.  The right atrium is mildly dilated.  There is mild mitral regurgitation.  There was insufficient TR detected to calculate RV systolic pressure.  There is a pacemaker lead in the right ventricle.   Last Stress Test  01/04/2022 IMPRESSION:  1. Images are consistent with dilated cardiomyopathy with a fixed  inferior wall defect and no significant reversibility. Of interest is the  fact that there is significant reverse redistribution of the septal wall  and anterior wall which is of unknown clinical significance   2. Calculated ejection fraction 17%   3. Dilated cardiomyopathy with an ejection fraction of 17% calculated and  a fixed defect of the inferior wall without significant reversibility.   Patient Profile     63 y.o. male h/o NICM, chronic sCHF s/p single  chamber Abbott ICD, HLD, HIV, substance abuse (ETOH, THC, Cocaine), CKD II & polycythemia  Known hx of VT Long hx of noncompliance complicating his care Last seen in our clinic by myself 11/02/23, lengthy visit, a lot of symptoms, had VT in Oct Discussed meds at length Plan of care with in clinic MD Afterwards reported very unhappy with his care and cancelled all future visits with our team  Admitted 01/03/24 > HF exacerbation > R/LHC, planned for admission/HF magement, left AMA  not felt to be a candidate for destination therapy. He has a history of ongoing tobacco use and noncompliance   ER visit 01/16/24 for ICD shock > left AMA angry his room didn't have a TV  Admitted this stay feeling poorly > multiple VT episodes, therapies, had stopped his amiodarone Not felt to be overtly volume OL Started on IV amio Device interrogation revealed episodes of monomorphic VT in the 180s. Faster VT's have since been successfully treated with ATP previously.  Added a zone with ATP starting in the 170s.   Device History: Abbott Single Chamber ICD implanted 01/08/2018    + appropriate tx   AAD hx amiodarone, started 03/2023 at [facility]    Assessment & Plan    VT Known for him + NSVTs, has 3 morphologys, mostly slower rates now No sustained VT since 01/24/24 (had ATP then as well) Transitioned to PO amiodarone 01/27/24 (400mg  BID) >> 01/30/24 >> 200mg  BID (and stopped zofran) with QT prolongation  Denies nausea Occ PVCs, couplets, rare NSVT s Off BB with concerns of low output and his hypotension  No sustained VTssince last down titration of his amio  NICM Chronic CHF He does not appear volume OL Appreciate HF team input End stage NICM Not a candidate for advanced therapies Palliative care on board > referral to [facility] >> HOSPICE planned for discharge into Hospice facility once insurance/placement finalized Appreciate Palliative/Hospice/Chaplain services DNR/DNI ICD tachy  therapies turned off 01/28/24  Creat about baseline Uncertain I/O accuracy > pt reports normal urine out put Continue jardiance Eplerenone >> increased dose for volume and K+  BPs marginal > high 80's back 90's  Had 2 falls 01/30/24 1st after BM as he stood up fell forward, denies syncope then again shortly after, reports just got weak, again denied syncope, did not feel lightheaded/dizzy CT head  stable without acute findings Midodrine added 01/31/24 with some improvement in his BP Felt better yesterday   HIV Home meds  5. Hospice  DNR/DNI Device tachy-therapies are off  Appreciate palliative/Hospice/SW/TOC teams Awaiting hospice facility/placement (previously living at a group home)  6. Poor appetite Pt denies ongoing nausea, says he tries to eat, but nothing tastes good D/w pharmacy options for appetite stim > recommended we try Marinol No improvement here yet, but is eating, just not much    For questions or updates, please contact [facility] Please consult [email or web address] for contact info under        Signed, Sheilah Pigeon, PA-C  02/01/2024, 9:10 AM     I have seen and examined this patient with Francis Dowse.  Agree with above, note added to reflect my findings.  Currently feeling well.  Did have fluttering overnight with some nonsustained VT on personal review of telemetry.  Otherwise does feel like he is potentially retaining fluid.  GEN: Well nourished, well developed, in no acute distress  HEENT: normal  Neck: no JVD, carotid bruits, or masses Cardiac: RRR; no murmurs, rubs, or gallops,no edema  Respiratory:  clear to auscultation bilaterally, normal work of breathing GI: soft, nontender, nondistended, + BS MS: no deformity or atrophy  Skin: warm and dry, device site well healed Neuro:  Strength and sensation are intact Psych: euthymic mood, full affect   Ventricular tachycardia: Has had multiple morphologies.  Currently on p.o. amiodarone 200 mg  twice daily.  Nausea has improved.  Ryan Long continue with current management.  If he has more episodes of nonsustained VT, could add mexiletine. Chronic systolic heart failure: Not a candidate for advanced therapies.  Appreciate palliative care help.  Has plans for home with hospice.  Continue current medications.  Continue current medications. Hypotension: Midodrine added yesterday with improved blood pressures.  Ryan Long increase to 10 mg 3 times daily today. HIV: Continue home medications Poor appetite: Have added Marinol.  Ryan Long M. Isley Zinni MD 02/01/2024 10:16 AM

## 2024-02-01 NOTE — TOC Progression Note (Addendum)
Transition of Care [facility]) - Progression Note    Patient Details  Name: Ryan Long MRN: [id number] Date of Birth: 12-17-1961  Transition of Care [facility]) CM/SW Contact  Delilah Shan, LCSWA Phone Number: 02/01/2024, 3:49 PM  Clinical Narrative:     Patient currently has no SNF LTC bed offers. CSW awaiting to hear from [facility] on if they can offer. [facility] confirmed they are unable to offer SNF bed for patient. CSW awaiting on call back from [facility] on if they can offer SNF bed for patient. CSW updated patient.CSW will continue to follow.  Update- CSW received call back from [place] with [facility]. Orlan Leavens confirmed facility still reviewing referral.     Barriers to Discharge: Continued Medical Work up  Expected Discharge Plan and Services       Living arrangements for the past 2 months: Boarding House                                       Social Determinants of Health (SDOH) Interventions SDOH Screenings   Food Insecurity: No Food Insecurity (01/24/2024)  Housing: Low Risk  (01/24/2024)  Recent Concern: Housing - Medium Risk (10/26/2023)   Received from [facility]  Transportation Needs: No Transportation Needs (01/24/2024)  Utilities: Not At Risk (01/24/2024)  Alcohol Screen: Low Risk  (07/17/2023)  Depression (PHQ2-9): Low Risk  (08/01/2023)  Financial Resource Strain: Low Risk  (07/17/2023)  Physical Activity: Insufficiently Active (07/17/2023)  Social Connections: Moderately Integrated (07/17/2023)  Stress: Stress Concern Present (07/17/2023)  Tobacco Use: High Risk (01/24/2024)  Health Literacy: Adequate Health Literacy (07/18/2023)    Readmission Risk Interventions     No data to display

## 2024-02-01 NOTE — Progress Notes (Signed)
Pt called this RN to room. He stated that he felt his heart flutter and felt like he was going to pass out. He states he is still filling short of breath. This RN reviewed telemetry history and saw pt had a 9 beat run of vtach at 2154.  Pt's sats are 96-99 % room air. BP 92/72. MD notified

## 2024-02-02 DIAGNOSIS — I472 Ventricular tachycardia, unspecified: Secondary | ICD-10-CM | POA: Diagnosis not present

## 2024-02-02 LAB — URINALYSIS, ROUTINE W REFLEX MICROSCOPIC
Bacteria, UA: NONE SEEN
Bilirubin Urine: NEGATIVE
Glucose, UA: >=500 mg/dL — AB
Hgb urine dipstick: NEGATIVE
Ketones, ur: NEGATIVE mg/dL
Leukocytes,Ua: NEGATIVE
Nitrite: NEGATIVE
Protein, ur: NEGATIVE mg/dL
Specific Gravity, Urine: 1.016 (ref 1.005–1.030)
pH: 6 (ref 5.0–8.0)

## 2024-02-02 MED ORDER — MEXILETINE HCL 250 MG PO CAPS
250.0000 mg | ORAL_CAPSULE | Freq: Two times a day (BID) | ORAL | Status: DC
  Administered 2024-02-02 – 2024-02-08 (×13): 250 mg via ORAL
  Filled 2024-02-02 (×14): qty 1

## 2024-02-02 MED ORDER — ONDANSETRON HCL 4 MG/2ML IJ SOLN
4.0000 mg | Freq: Once | INTRAMUSCULAR | Status: DC
  Filled 2024-02-02: qty 2

## 2024-02-02 MED ORDER — LEVALBUTEROL HCL 0.63 MG/3ML IN NEBU
0.6300 mg | INHALATION_SOLUTION | Freq: Four times a day (QID) | RESPIRATORY_TRACT | Status: DC | PRN
  Administered 2024-02-03 – 2024-02-08 (×16): 0.63 mg via RESPIRATORY_TRACT
  Filled 2024-02-02 (×17): qty 3

## 2024-02-02 MED ORDER — ALBUTEROL SULFATE (2.5 MG/3ML) 0.083% IN NEBU
2.5000 mg | INHALATION_SOLUTION | Freq: Two times a day (BID) | RESPIRATORY_TRACT | Status: DC
  Administered 2024-02-02: 2.5 mg via RESPIRATORY_TRACT
  Filled 2024-02-02: qty 3

## 2024-02-02 MED ORDER — LEVALBUTEROL HCL 0.63 MG/3ML IN NEBU
0.6300 mg | INHALATION_SOLUTION | Freq: Four times a day (QID) | RESPIRATORY_TRACT | Status: AC
  Administered 2024-02-02 – 2024-02-03 (×4): 0.63 mg via RESPIRATORY_TRACT
  Filled 2024-02-02 (×4): qty 3

## 2024-02-02 MED ORDER — ONDANSETRON HCL 4 MG PO TABS
4.0000 mg | ORAL_TABLET | Freq: Once | ORAL | Status: AC
  Administered 2024-02-02: 4 mg via ORAL
  Filled 2024-02-02: qty 1

## 2024-02-02 NOTE — Progress Notes (Signed)
Rounding Note    Patient Name: Ryan Long Date of Encounter: 02/02/2024  [facility] Cardiologist: None   Subjective   Continuing to have episodes of VT.  Had 1 episode yesterday and was symptomatic at 9 beats.  Also having lower abdominal pain, and burning with urination.  Inpatient Medications    Scheduled Meds:  albuterol  2.5 mg Inhalation BID   amiodarone  200 mg Oral BID   aspirin EC  81 mg Oral Daily   bictegravir-emtricitabine-tenofovir AF  1 tablet Oral Daily   clonazepam  0.125 mg Oral TID   dronabinol  5 mg Oral BID AC   empagliflozin  10 mg Oral QAC breakfast   eplerenone  25 mg Oral Daily   finasteride  5 mg Oral Daily   fluticasone furoate-vilanterol  1 puff Inhalation Daily   furosemide  40 mg Intravenous Once   heparin  5,000 Units Subcutaneous Q8H   mexiletine  250 mg Oral Q12H   midodrine  10 mg Oral TID WC   nicotine  14 mg Transdermal Q24H   polyethylene glycol  17 g Oral Daily   tamsulosin  0.4 mg Oral BID   valACYclovir  500 mg Oral Daily   Continuous Infusions:  magnesium sulfate bolus IVPB Stopped (01/25/24 1737)   PRN Meds: acetaminophen, LORazepam, magnesium hydroxide, morphine injection   Vital Signs    Vitals:   02/01/24 2300 02/01/24 2317 02/02/24 0519 02/02/24 0754  BP:  93/72 105/84 96/80  Pulse: (!) 103 (!) 101 99 (!) 108  Resp:  20 18 19   Temp:  98.9 F (37.2 C) 97.8 F (36.6 C) 97.8 F (36.6 C)  TempSrc:  Oral Oral Oral  SpO2:  99% 98% 97%  Weight:   87 kg   Height:        Intake/Output Summary (Last 24 hours) at 02/02/2024 1008 Last data filed at 02/02/2024 1610 Gross per 24 hour  Intake 720 ml  Output --  Net 720 ml      02/02/2024    5:19 AM 02/01/2024    5:00 AM 01/31/2024    4:55 AM  Last 3 Weights  Weight (lbs) 191 lb 12.8 oz 189 lb 4.8 oz 188 lb 9.6 oz  Weight (kg) 87 kg 85.866 kg 85.548 kg      Telemetry    Sinus rhythm to sinus tachycardia with nonsustained VT- Personally  Reviewed  ECG     - Personally Reviewed  Physical Exam   GEN: No acute distress.   Neck: No JVD Cardiac: RRR, no murmurs, rubs, or gallops.  Respiratory: Clear to auscultation bilaterally. GI: Soft, lower abdomen tender to palpation, non-distended  MS: No edema; No deformity. Neuro:  Nonfocal  Psych: Normal affect   Labs    High Sensitivity Troponin:  No results for input(s): "TROPONINIHS" in the last 720 hours.   Chemistry Recent Labs  Lab 01/29/24 0409 01/30/24 0442 01/31/24 0448  NA 139 138 138  K 3.8 3.5 3.9  CL 104 102 106  CO2 21* 20* 21*  GLUCOSE 88 135* 91  BUN 17 14 10   CREATININE 1.59* 1.46* 1.42*  CALCIUM 8.7* 8.5* 8.4*  MG 2.0 2.0 2.2  GFRNONAA 49* 54* 56*  ANIONGAP 14 16* 11    Lipids No results for input(s): "CHOL", "TRIG", "HDL", "LABVLDL", "LDLCALC", "CHOLHDL" in the last 168 hours.  HematologyNo results for input(s): "WBC", "RBC", "HGB", "HCT", "MCV", "MCH", "MCHC", "RDW", "PLT" in the last 168 hours. Thyroid No  results for input(s): "TSH", "FREET4" in the last 168 hours.  BNPNo results for input(s): "BNP", "PROBNP" in the last 168 hours.  DDimer No results for input(s): "DDIMER" in the last 168 hours.   Radiology    No results found.  Cardiac Studies    Patient Profile     63 y.o. male with a history of chronic systolic heart failure presented to the hospital with VT storm  Assessment & Plan    1.  Ventricular tachycardia: Has had multiple morphologies.  Currently on amiodarone 200 mg twice daily.  Nausea has improved.  He is continue to have short runs of symptomatic VT.  Adding mexiletine 250 mg twice daily.  2.  Chronic systolic heart failure: Not a candidate for advanced therapies.  Appreciate palliative assistance.  3.  Hypotension: Currently on midodrine.  4.  HIV: Continue home medications  5.  Poor appetite: Have added Marinol  6.  Lower abdominal pain: Burning with urination.  Catlin Aycock plan for urinalysis.  For questions or  updates, please contact [facility] Please consult [email or web address] for contact info under        Signed, Niomie Englert [name], MD  02/02/2024, 10:08 AM

## 2024-02-03 DIAGNOSIS — I472 Ventricular tachycardia, unspecified: Secondary | ICD-10-CM | POA: Diagnosis not present

## 2024-02-03 MED ORDER — ONDANSETRON HCL 4 MG PO TABS
4.0000 mg | ORAL_TABLET | Freq: Once | ORAL | Status: AC
  Administered 2024-02-03: 4 mg via ORAL
  Filled 2024-02-03: qty 1

## 2024-02-03 NOTE — Plan of Care (Signed)
 Will continue to monitor patient.

## 2024-02-03 NOTE — Progress Notes (Signed)
Rounding Note    Patient Name: Ryan Long Date of Encounter: 02/03/2024  [facility] Cardiologist: None   Subjective   Felt palpitations overnight.  Continues to have lower abdominal pain.  Inpatient Medications    Scheduled Meds:  amiodarone  200 mg Oral BID   aspirin EC  81 mg Oral Daily   bictegravir-emtricitabine-tenofovir AF  1 tablet Oral Daily   clonazepam  0.125 mg Oral TID   dronabinol  5 mg Oral BID AC   empagliflozin  10 mg Oral QAC breakfast   eplerenone  25 mg Oral Daily   finasteride  5 mg Oral Daily   fluticasone furoate-vilanterol  1 puff Inhalation Daily   furosemide  40 mg Intravenous Once   heparin  5,000 Units Subcutaneous Q8H   levalbuterol  0.63 mg Nebulization Q6H   mexiletine  250 mg Oral Q12H   midodrine  10 mg Oral TID WC   nicotine  14 mg Transdermal Q24H   polyethylene glycol  17 g Oral Daily   tamsulosin  0.4 mg Oral BID   valACYclovir  500 mg Oral Daily   Continuous Infusions:  magnesium sulfate bolus IVPB Stopped (01/25/24 1737)   PRN Meds: acetaminophen, levalbuterol, LORazepam, magnesium hydroxide, morphine injection   Vital Signs    Vitals:   02/02/24 2318 02/03/24 0238 02/03/24 0545 02/03/24 0722  BP: 101/82  98/81 94/79  Pulse: (!) 107 97 (!) 102 100  Resp: 20 14 18 17   Temp: 97.7 F (36.5 C)  98 F (36.7 C) 97.6 F (36.4 C)  TempSrc: Oral  Oral Oral  SpO2: 96% 90% 98% 98%  Weight:   86.9 kg   Height:        Intake/Output Summary (Last 24 hours) at 02/03/2024 0825 Last data filed at 02/02/2024 2100 Gross per 24 hour  Intake 240 ml  Output --  Net 240 ml      02/03/2024    5:45 AM 02/02/2024    5:19 AM 02/01/2024    5:00 AM  Last 3 Weights  Weight (lbs) 191 lb 8 oz 191 lb 12.8 oz 189 lb 4.8 oz  Weight (kg) 86.864 kg 87 kg 85.866 kg      Telemetry    Sinus rhythm with PVCs and nonsustained VT- Personally Reviewed  ECG     - Personally Reviewed  Physical Exam   GEN: No acute  distress.   Neck: No JVD Cardiac: RRR, no murmurs, rubs, or gallops.  Respiratory: Clear to auscultation bilaterally. GI: Soft, tender to palpation, lower central abdomen MS: No edema; No deformity. Neuro:  Nonfocal  Psych: Normal affect   Labs    High Sensitivity Troponin:  No results for input(s): "TROPONINIHS" in the last 720 hours.   Chemistry Recent Labs  Lab 01/29/24 0409 01/30/24 0442 01/31/24 0448  NA 139 138 138  K 3.8 3.5 3.9  CL 104 102 106  CO2 21* 20* 21*  GLUCOSE 88 135* 91  BUN 17 14 10   CREATININE 1.59* 1.46* 1.42*  CALCIUM 8.7* 8.5* 8.4*  MG 2.0 2.0 2.2  GFRNONAA 49* 54* 56*  ANIONGAP 14 16* 11    Lipids No results for input(s): "CHOL", "TRIG", "HDL", "LABVLDL", "LDLCALC", "CHOLHDL" in the last 168 hours.  HematologyNo results for input(s): "WBC", "RBC", "HGB", "HCT", "MCV", "MCH", "MCHC", "RDW", "PLT" in the last 168 hours. Thyroid No results for input(s): "TSH", "FREET4" in the last 168 hours.  BNPNo results for input(s): "BNP", "PROBNP" in the  last 168 hours.  DDimer No results for input(s): "DDIMER" in the last 168 hours.   Radiology    No results found.  Cardiac Studies     Patient Profile     63 y.o. male history of chronic systolic heart failure presented to the hospital with VT storm.  Assessment & Plan    1.  Ventricular tachycardia: Has had multiple morphologies.  Currently on amiodarone 200 mg twice daily, mexiletine to 50 mg twice daily.  Ventricular ectopy has improved.  Jahmya Onofrio continue with current management.  2.  Chronic systolic heart failure: Not a candidate for advanced therapies.  Appreciate palliative assistance.  3.  Hypotension: Continue midodrine  4.  HIV: Continue home medications  5.  Poor appetite: Have added Marinol  6.  Lower abdominal pain: Urinalysis negative.  Terris Bodin plan for CT abdomen pelvis  For questions or updates, please contact [facility] Please consult [email or web address] for contact info under         Signed, Yizel Canby [name], MD  02/03/2024, 8:25 AM

## 2024-02-03 NOTE — Progress Notes (Signed)
Patient is refusing the scan a due to him not having any access. He refuses to have an IV. Notified  Camnitz MD. Ordered a bladder scan instead

## 2024-02-03 NOTE — Progress Notes (Signed)
Mobility Specialist Progress Note:    02/03/24 1359  Mobility  Activity Ambulated with assistance in hallway  Level of Assistance Standby assist, set-up cues, supervision of patient - no hands on  Assistive Device Four wheel walker  Distance Ambulated (ft) 350 ft  Activity Response Tolerated well  Mobility Referral Yes  Mobility visit 1 Mobility  Mobility Specialist Start Time (ACUTE ONLY) 1125  Mobility Specialist Stop Time (ACUTE ONLY) 1140  Mobility Specialist Time Calculation (min) (ACUTE ONLY) 15 min   Received pt leaving BR having no complaints and agreeable to mobility. Took x3 seated rest breaks d/t fatigue, VSS. Returned to room w/o fault. Left seated EOB w/ call bell in reach and all needs met.   Thompson Grayer Mobility Specialist  Please contact vis Secure Chat or  Rehab Office [phone]

## 2024-02-04 ENCOUNTER — Inpatient Hospital Stay (HOSPITAL_COMMUNITY): Payer: 59

## 2024-02-04 DIAGNOSIS — I472 Ventricular tachycardia, unspecified: Secondary | ICD-10-CM | POA: Diagnosis not present

## 2024-02-04 DIAGNOSIS — Z515 Encounter for palliative care: Secondary | ICD-10-CM | POA: Diagnosis not present

## 2024-02-04 DIAGNOSIS — Z7189 Other specified counseling: Secondary | ICD-10-CM | POA: Diagnosis not present

## 2024-02-04 MED ORDER — PROMETHAZINE HCL 12.5 MG PO TABS
12.5000 mg | ORAL_TABLET | Freq: Four times a day (QID) | ORAL | Status: DC | PRN
Start: 2024-02-04 — End: 2024-02-04
  Administered 2024-02-04 (×2): 12.5 mg via ORAL
  Filled 2024-02-04 (×3): qty 1

## 2024-02-04 MED ORDER — PROMETHAZINE HCL 25 MG RE SUPP: 25.0000 mg | Freq: Four times a day (QID) | RECTAL | Status: DC | PRN

## 2024-02-04 MED ORDER — ONDANSETRON HCL 4 MG PO TABS
4.0000 mg | ORAL_TABLET | Freq: Two times a day (BID) | ORAL | Status: DC | PRN
  Administered 2024-02-04 – 2024-02-07 (×4): 4 mg via ORAL
  Filled 2024-02-04 (×4): qty 1

## 2024-02-04 MED ORDER — IOHEXOL 300 MG/ML  SOLN: 30.0000 mL | Freq: Once | INTRAMUSCULAR | Status: DC | PRN

## 2024-02-04 NOTE — Care Management Important Message (Signed)
Important Message  Patient Details  Name: Ryan Long MRN: [id number] Date of Birth: 1961-10-28   Important Message Given:  Yes - Medicare IM     Renie Ora 02/04/2024, 11:25 AM

## 2024-02-04 NOTE — TOC Progression Note (Signed)
Transition of Care [facility]) - Progression Note    Patient Details  Name: Ryan Long MRN: [id number] Date of Birth: 1961/08/08  Transition of Care [facility]) CM/SW Contact  Delilah Shan, LCSWA Phone Number: 02/04/2024, 10:16 AM  Clinical Narrative:     CSW spoke with Orlan Leavens with [facility] who informed CSW he plans to follow up with DON on if they can offer patient LTC. CSW LVM with Tanya with [facility]. CSW awaiting call back to confirm if they can offer LTC bed for patient. CSW will continue to follow.    Barriers to Discharge: Continued Medical Work up  Expected Discharge Plan and Services       Living arrangements for the past 2 months: Boarding House                                       Social Determinants of Health (SDOH) Interventions SDOH Screenings   Food Insecurity: No Food Insecurity (01/24/2024)  Housing: Low Risk  (01/24/2024)  Recent Concern: Housing - Medium Risk (10/26/2023)   Received from [facility]  Transportation Needs: No Transportation Needs (01/24/2024)  Utilities: Not At Risk (01/24/2024)  Alcohol Screen: Low Risk  (07/17/2023)  Depression (PHQ2-9): Low Risk  (08/01/2023)  Financial Resource Strain: Low Risk  (07/17/2023)  Physical Activity: Insufficiently Active (07/17/2023)  Social Connections: Moderately Integrated (07/17/2023)  Stress: Stress Concern Present (07/17/2023)  Tobacco Use: High Risk (01/24/2024)  Health Literacy: Adequate Health Literacy (07/18/2023)    Readmission Risk Interventions     No data to display

## 2024-02-04 NOTE — Progress Notes (Signed)
Mobility Specialist Progress Note;   02/04/24 1514  Therapy Vitals  Pulse Rate (!) 103  Resp 18  Patient Position (if appropriate) Lying  Oxygen Therapy  SpO2 98 %  O2 Device Room Air  Mobility  Activity Refused mobility   Pt politely declining mobility at this time with no reason specified. Encouraged pt however pt stated "come back tomorrow". Will f/u as schedule permits.   Caesar Bookman Mobility Specialist Please contact via SecureChat or [employer] [phone] \

## 2024-02-04 NOTE — Progress Notes (Addendum)
  Patient Name: Ryan Long Date of Encounter: 02/04/2024  Primary Cardiologist: None Electrophysiologist: Shriley Joffe [name], MD  Interval Summary   The patient reports he did not want to go for CT yesterday due to need for IV. He notes ongoing lower abd pain and nausea.  At this time, the patient denies chest pain, shortness of breath, or any new concerns.  Vital Signs    Vitals:   02/03/24 0855 02/03/24 1533 02/03/24 2033 02/04/24 0344  BP:  107/88 94/74 91/74   Pulse:  (!) 111 99 100  Resp:  18 16 16   Temp:  97.6 F (36.4 C) 97.6 F (36.4 C) 97.6 F (36.4 C)  TempSrc:  Oral Oral Oral  SpO2: 96% 99% 99% 100%  Weight:    86.4 kg  Height:        Intake/Output Summary (Last 24 hours) at 02/04/2024 0713 Last data filed at 02/03/2024 2200 Gross per 24 hour  Intake 480 ml  Output --  Net 480 ml   Filed Weights   02/02/24 0519 02/03/24 0545 02/04/24 0344  Weight: 87 kg 86.9 kg 86.4 kg    Physical Exam    GEN- The patient is well appearing, alert and oriented x 3 today.   Lungs- Clear to ausculation bilaterally, normal work of breathing Cardiac- Regular rate and rhythm, no murmurs, rubs or gallops GI- soft, NT, ND, + BS Extremities- no clubbing or cyanosis. No edema  Telemetry    SR 90-ST 110's with 1st degree AVB, PVC's, trigeminy, NSVT  (personally reviewed)  Hospital Course    Ryan Long is a 63 y.o. male with HFrEF admitted for VT storm.   Assessment & Plan    VT  Multiple morphologies -continue amiodarone 200mg  BID  -mexiletine 50 mg BID  -tele monitoring   HFrEF  -not a candidate for advanced therapies  -palliative care for symptom burden  Hypotension  -continue midodrine   HIV  -continue home medications  Poor Appetite -marinol   Nausea, Lower Abd Pain -PRN phenergan  -assess CT abd/pelvis w/o contrast (oral only)   Disposition -awaiting placement     For questions or updates, please contact [facility] Please consult [email or web address] for contact info under Cardiology/STEMI.  Signed, Canary Brim, NP-C, AGACNP-BC [facility] - Electrophysiology  02/04/2024, 7:16 AM  I have seen and examined this patient with Canary Brim.  Agree with above, note added to reflect my findings.  Continues to have nausea and lower abdominal pain.  Otherwise no acute complaints.  GEN: Well nourished, well developed, in no acute distress  HEENT: normal  Neck: no JVD, carotid bruits, or masses Cardiac: RRR; no murmurs, rubs, or gallops,no edema  Respiratory:  clear to auscultation bilaterally, normal work of breathing GI: soft, nontender, nondistended, + BS MS: no deformity or atrophy  Skin: warm and dry, device site well healed Neuro:  Strength and sensation are intact Psych: euthymic mood, full affect   Ventricular tachycardia: Has had multiple morphologies.  Currently on amiodarone and mexiletine.  Significantly reduced burden. Chronic systolic heart failure: Not a candidate for advanced therapies.  Palliative care for symptom burden. Hypotension: Continue midodrine Lower abdominal pain: UA negative.  Continuing to have a discomfort.  Not associated with eating.  Ryan Long plan for CT abdomen and pelvis.  Patient does not have an IUD and would prefer to avoid this.  Ryan Long plan for oral contrast only. HIV: Continue home medications  Ryan Long M. Genelle Economou MD 02/04/2024 8:18 AM

## 2024-02-04 NOTE — Progress Notes (Addendum)
Palliative Medicine Inpatient Follow Up Note HPI: [name] presents today for evaluation of worsening VT. He is a pleasant 63 year old man with a history of hypertension, depression, chronic systolic heart failure, HIV disease, and a history of polysubstance abuse. Palliative care has been asked to get involved in the setting of end stage heart failure.   Today's Discussion 02/04/2024  *Please note that this is a verbal dictation therefore any spelling or grammatical errors are due to the "Dragon Medical One" system interpretation.  Chart reviewed inclusive of vital signs, progress notes, laboratory results, and diagnostic images.   I met with Ryan Long this morning. He is awake and alert. He shares that he is feeling a "whole lot better" since we last saw one another last week. His anxiety is under much better control. He has had gradual increase in his appetite.   Ryan Long does note ongoing abdominal pain for which the primary medical team plan to complete a CT scan this afternoon.   I completed a MOST form today. The patient and family outlined their wishes for the following treatment decisions:  Cardiopulmonary Resuscitation: Do Not Attempt Resuscitation (DNR/No CPR)  Medical Interventions: Limited Additional Interventions: Use medical treatment, IV fluids and cardiac monitoring as indicated, DO NOT USE intubation or mechanical ventilation. May consider use of less invasive airway support such as BiPAP or CPAP. Also provide comfort measures. Transfer to the hospital if indicated. Avoid intensive care.   Antibiotics: Determine use of limitation of antibiotics when infection occurs  IV Fluids: IV fluids for a defined trial period  Feeding Tube: Feeding tube for a defined trial period   Questions and concerns addressed/Palliative Support Provided.   Objective Assessment: Vital Signs Vitals:   02/04/24 0925 02/04/24 1127  BP:  91/73  Pulse: 98 100  Resp: 16 18  Temp:  98.1 F (36.7 C)   SpO2: 98%     Intake/Output Summary (Last 24 hours) at 02/04/2024 1309 Last data filed at 02/03/2024 2200 Gross per 24 hour  Intake 480 ml  Output --  Net 480 ml   Last Weight  Most recent update: 02/04/2024  3:47 AM    Weight  86.4 kg (190 lb 6.4 oz)            Gen: Older African-American male in no acute distress HEENT: moist mucous membranes CV: Regular rate and rhythm PULM: On RA, breathing is even and nonlabored ABD: soft/nontender EXT: No edema Neuro: Alert and oriented x3  SUMMARY OF RECOMMENDATIONS   DNAR/DNI  MOST/DNR Form Completed, paper copy placed onto the chart electric copy can be found in Engineer, building services Support for existential distress  Awaiting placement with [facility] following from a Palliative perspective  PMT will continue to follow along incrementally  Symptoms: Anxiety: - Continue ativan 1mg  IV Q4H PRN  - Add low dose clonazepam TID  Adult FTT: - Continue marinol 5mg  PO BID  Constipation: - Await CT results - Miralax 17gm Daily  MDM - High symptom management review and modification of controlled substances, advanced care planning. ______________________________________________________________________________________ Ryan Long [place] Palliative Medicine Team Team Cell Phone: [phone] Please utilize secure chat with additional questions, if there is no response within 30 minutes please call the above phone number  Palliative Medicine Team providers are available by phone from 7am to 7pm daily and can be reached through the team cell phone.  Should this patient require assistance outside of these hours, please call the patient's attending physician.

## 2024-02-04 NOTE — Plan of Care (Signed)
 Will continue to monitor.

## 2024-02-05 DIAGNOSIS — I472 Ventricular tachycardia, unspecified: Secondary | ICD-10-CM | POA: Diagnosis not present

## 2024-02-05 NOTE — Progress Notes (Addendum)
  Patient Name: Ryan Long Date of Encounter: 02/05/2024  Primary Cardiologist: None Electrophysiologist: Ambriella Kitt [name], MD  Interval Summary   The patient is doing well today.  At this time, the patient denies chest pain, shortness of breath, or any new concerns.  Vital Signs    Vitals:   02/04/24 1514 02/04/24 1926 02/04/24 2109 02/05/24 0323  BP:  (!) 85/68 97/77 91/79   Pulse: (!) 103 100  (!) 107  Resp: 18 16  18   Temp:  98.6 F (37 C)  97.6 F (36.4 C)  TempSrc:  Oral  Oral  SpO2: 98%   100%  Weight:    86.6 kg  Height:       No intake or output data in the 24 hours ending 02/05/24 0722 Filed Weights   02/03/24 0545 02/04/24 0344 02/05/24 0323  Weight: 86.9 kg 86.4 kg 86.6 kg    Physical Exam    GEN- The patient is well appearing, alert and oriented x 3 today.   Lungs- Clear to ausculation bilaterally, normal work of breathing Cardiac- Regular rate and rhythm, no murmurs, rubs or gallops GI- soft, NT, ND, + BS Extremities- no clubbing or cyanosis. No edema  Telemetry    ST 100's with rare NSVT (personally reviewed)  Hospital Course    Ryan Long is a 63 y.o. male with HFrEF admitted for VT storm.   Assessment & Plan    VT  Multiple morphologies -amiodarone 200 mg BID  -mexiletine 50 mg BID -tele monitoring while inpatient    HFrEF  Not a candidate for advanced therapies  -palliative care for symptom burden   Hypotension  -midodrine for soft BP   HIV  -continue home regimen    Poor Appetite -marinol for appetite    Nausea, Lower Abd Pain -PRN phenergan  -CT ABD/Pelvis negative for acute pathology  -UA negative   Disposition -awaiting placement      For questions or updates, please contact [facility] Please consult [email or web address] for contact info under Cardiology/STEMI.  Signed, Canary Brim, NP-C, AGACNP-BC [facility] - Electrophysiology  02/05/2024, 7:22 AM   I have seen and examined  this patient with Canary Brim.  Agree with above, note added to reflect my findings.  Feeling well without acute complaint.  Does continue to have lower abdominal pain.  CT yesterday without acute abnormality.  GEN: Well nourished, well developed, in no acute distress  HEENT: normal  Neck: no JVD, carotid bruits, or masses Cardiac: RRR; no murmurs, rubs, or gallops,no edema  Respiratory:  clear to auscultation bilaterally, normal work of breathing GI: soft, nontender, nondistended, + BS MS: no deformity or atrophy  Skin: warm and dry, device site well healed Neuro:  Strength and sensation are intact Psych: euthymic mood, full affect   Ventricular tachycardia: Currently on amiodarone 200 mg twice daily, mexiletine 250 mg twice daily.  Minimal VT overnight. Chronic systolic heart failure: Not a candidate for advanced therapies.  Palliative care has arranged hospice at discharge. Hypotension: Continue midodrine HIV: Continue home medications Nausea nausea/lower abdominal pain: Continue Phenergan and Zofran.  CT abdomen and pelvis without acute pathology, urinalysis negative.  Ryan Preston M. Rebbie Lauricella MD 02/05/2024 9:40 AM

## 2024-02-05 NOTE — Plan of Care (Signed)
  Problem: Education: Goal: Knowledge of General Education information will improve Description: Including pain rating scale, medication(s)/side effects and non-pharmacologic comfort measures Outcome: Progressing   Problem: Health Behavior/Discharge Planning: Goal: Ability to manage health-related needs will improve Outcome: Progressing   Problem: Clinical Measurements: Goal: Ability to maintain clinical measurements within normal limits will improve Outcome: Progressing Goal: Diagnostic test results will improve Outcome: Progressing Goal: Respiratory complications will improve Outcome: Progressing Goal: Cardiovascular complication will be avoided Outcome: Progressing   Problem: Activity: Goal: Risk for activity intolerance will decrease Outcome: Progressing   Problem: Nutrition: Goal: Adequate nutrition will be maintained Outcome: Progressing   Problem: Coping: Goal: Level of anxiety will decrease Outcome: Progressing   Problem: Elimination: Goal: Will not experience complications related to bowel motility Outcome: Progressing Goal: Will not experience complications related to urinary retention Outcome: Progressing   Problem: Pain Managment: Goal: General experience of comfort will improve and/or be controlled Outcome: Progressing   Problem: Safety: Goal: Ability to remain free from injury will improve Outcome: Progressing   Problem: Skin Integrity: Goal: Risk for impaired skin integrity will decrease Outcome: Progressing   Problem: Education: Goal: Knowledge of disease or condition will improve Outcome: Progressing Goal: Understanding of medication regimen will improve Outcome: Progressing Goal: Individualized Educational Video(s) Outcome: Progressing   Problem: Activity: Goal: Ability to tolerate increased activity will improve Outcome: Progressing   Problem: Cardiac: Goal: Ability to achieve and maintain adequate cardiopulmonary perfusion will  improve Outcome: Progressing   Problem: Health Behavior/Discharge Planning: Goal: Ability to safely manage health-related needs after discharge will improve Outcome: Progressing

## 2024-02-05 NOTE — TOC Progression Note (Signed)
Transition of Care [facility]) - Progression Note    Patient Details  Name: Ryan Long MRN: [id number] Date of Birth: 1961-10-19  Transition of Care [facility]) CM/SW Contact  Delilah Shan, LCSWA Phone Number: 02/05/2024, 1:02 PM  Clinical Narrative:     Whitney with [facility] request for CSW to resent initial referral for possible LTC bed for patient. CSW awaiting to see if [place] can offer LTC bed for patient.    Barriers to Discharge: Continued Medical Work up  Expected Discharge Plan and Services       Living arrangements for the past 2 months: Boarding House                                       Social Determinants of Health (SDOH) Interventions SDOH Screenings   Food Insecurity: No Food Insecurity (01/24/2024)  Housing: Low Risk  (01/24/2024)  Recent Concern: Housing - Medium Risk (10/26/2023)   Received from [facility]  Transportation Needs: No Transportation Needs (01/24/2024)  Utilities: Not At Risk (01/24/2024)  Alcohol Screen: Low Risk  (07/17/2023)  Depression (PHQ2-9): Low Risk  (08/01/2023)  Financial Resource Strain: Low Risk  (07/17/2023)  Physical Activity: Insufficiently Active (07/17/2023)  Social Connections: Moderately Integrated (07/17/2023)  Stress: Stress Concern Present (07/17/2023)  Tobacco Use: High Risk (01/24/2024)  Health Literacy: Adequate Health Literacy (07/18/2023)    Readmission Risk Interventions     No data to display

## 2024-02-06 DIAGNOSIS — I472 Ventricular tachycardia, unspecified: Secondary | ICD-10-CM | POA: Diagnosis not present

## 2024-02-06 LAB — GLUCOSE, CAPILLARY: Glucose-Capillary: 95 mg/dL (ref 70–99)

## 2024-02-06 NOTE — Progress Notes (Addendum)
  Patient Name: Ryan Long Date of Encounter: 02/06/2024  Primary Cardiologist: None Electrophysiologist: Ryan Borden [name], MD  Interval Summary   The patient is doing well today. Abd pain improved.  At this time, the patient denies chest pain, shortness of breath, or any new concerns.  Vital Signs    Vitals:   02/05/24 0926 02/05/24 1950 02/06/24 0340 02/06/24 0751  BP:  96/79 99/80 (!) 87/74  Pulse:   (!) 109 (!) 101  Resp: 17 20 20 19   Temp: 97.8 F (36.6 C) 98.4 F (36.9 C) 97.6 F (36.4 C) 97.7 F (36.5 C)  TempSrc: Axillary Axillary Oral Oral  SpO2:  91% 90% 93%  Weight:   87.2 kg   Height:        Intake/Output Summary (Last 24 hours) at 02/06/2024 0946 Last data filed at 02/05/2024 1950 Gross per 24 hour  Intake 480 ml  Output --  Net 480 ml   Filed Weights   02/04/24 0344 02/05/24 0323 02/06/24 0340  Weight: 86.4 kg 86.6 kg 87.2 kg    Physical Exam    GEN- The patient is well appearing, alert and oriented x 3 today.   Lungs- Clear to ausculation bilaterally, normal work of breathing Cardiac- Regular rate and rhythm, no murmurs, rubs or gallops GI- soft, NT, ND, + BS Extremities- no clubbing or cyanosis. No edema  Telemetry    SR-ST 80-105, NSVT, PVC's  (personally reviewed)  Hospital Course    Ryan Long is a 63 y.o. male with HFrEF admitted for VT storm  Assessment & Plan    VT  Multiple morphologies -amiodarone 200mg  BID  -mexiletine 250 mg BID  -follow tele while inpatient    HFrEF  Not a candidate for advanced therapies  -appreciate palliative care support    Hypotension  -midodrine    HIV  -continue home regimen    Poor Appetite -encourage diet / PO intake  -marinol     Nausea, Lower Abd Pain UA, CT abd/pelvis negative  -PRN phenergan  Disposition -pending placement at SNF    For questions or updates, please contact [facility] Please consult [email or web address] for contact info under  Cardiology/STEMI.  Signed, Ryan Brim, NP-C, AGACNP-BC [facility] - Electrophysiology  02/06/2024, 9:48 AM  I have seen and examined this patient with Ryan Long.  Agree with above, note added to reflect my findings.  Patient feeling well without acute complaint.  Abdominal pain has improved.  GEN: Well nourished, well developed, in no acute distress  HEENT: normal  Neck: no JVD, carotid bruits, or masses Cardiac: RRR; no murmurs, rubs, or gallops,no edema  Respiratory:  clear to auscultation bilaterally, normal work of breathing GI: soft, nontender, nondistended, + BS MS: no deformity or atrophy  Skin: warm and dry, device site well healed Neuro:  Strength and sensation are intact Psych: euthymic mood, full affect   Ventricular tachycardia: Has continued to have short episodes of VT, though patient is minimally symptomatic.  Currently on amiodarone 200 mg twice daily, mexiletine 250 mg twice daily.  For now, we Ryan Long continue with current therapy. Chronic systolic heart failure: No plans for escalation of therapy.  Patient is at end-stage.  Palliative care has been consulted and he Ryan Long be discharged with hospice. Hypotension: Continue midodrine Abdominal pain: Has improved.  CT scan and urinalysis without acute issue.  Ryan Long. Ryan Guillermo MD 02/06/2024 2:17 PM

## 2024-02-06 NOTE — TOC Progression Note (Signed)
Transition of Care [facility]) - Progression Note    Patient Details  Name: Ryan Long MRN: [id number] Date of Birth: 1961/04/26  Transition [facility] Care [facility]) CM/SW Contact  Delilah Shan, LCSWA Phone Number: 02/06/2024, 3:48 PM  Clinical Narrative:     Tammy with [facility] informed CSW still reviewing referral to see if facility can offer LTC bed for patient. Patient agreeable for Tammy to meet with him to further discuss possible placement and help assist with questions she has to help determine if facility can make LTC bed offer. Tammy plans to follow back up with CSW on determination on if facility can make LTC bed offer. CSW will continue to follow.    Barriers to Discharge: Continued Medical Work up  Expected Discharge Plan and Services       Living arrangements for the past 2 months: Boarding House                                       Social Determinants of Health (SDOH) Interventions SDOH Screenings   Food Insecurity: No Food Insecurity (01/24/2024)  Housing: Low Risk  (01/24/2024)  Recent Concern: Housing - Medium Risk (10/26/2023)   Received from [facility]  Transportation Needs: No Transportation Needs (01/24/2024)  Utilities: Not At Risk (01/24/2024)  Alcohol Screen: Low Risk  (07/17/2023)  Depression (PHQ2-9): Low Risk  (08/01/2023)  Financial Resource Strain: Low Risk  (07/17/2023)  Physical Activity: Insufficiently Active (07/17/2023)  Social Connections: Moderately Integrated (07/17/2023)  Stress: Stress Concern Present (07/17/2023)  Tobacco Use: High Risk (01/24/2024)  Health Literacy: Adequate Health Literacy (07/18/2023)    Readmission Risk Interventions     No data to display

## 2024-02-06 NOTE — Plan of Care (Signed)
  Problem: Education: Goal: Knowledge of General Education information will improve Description: Including pain rating scale, medication(s)/side effects and non-pharmacologic comfort measures Outcome: Progressing   Problem: Health Behavior/Discharge Planning: Goal: Ability to manage health-related needs will improve Outcome: Progressing   Problem: Clinical Measurements: Goal: Ability to maintain clinical measurements within normal limits will improve Outcome: Progressing Goal: Diagnostic test results will improve Outcome: Progressing Goal: Respiratory complications will improve Outcome: Progressing Goal: Cardiovascular complication will be avoided Outcome: Progressing   Problem: Activity: Goal: Risk for activity intolerance will decrease Outcome: Progressing   Problem: Nutrition: Goal: Adequate nutrition will be maintained Outcome: Progressing   Problem: Coping: Goal: Level of anxiety will decrease Outcome: Progressing   Problem: Elimination: Goal: Will not experience complications related to bowel motility Outcome: Progressing Goal: Will not experience complications related to urinary retention Outcome: Progressing   Problem: Pain Managment: Goal: General experience of comfort will improve and/or be controlled Outcome: Progressing   Problem: Safety: Goal: Ability to remain free from injury will improve Outcome: Progressing   Problem: Skin Integrity: Goal: Risk for impaired skin integrity will decrease Outcome: Progressing   Problem: Skin Integrity: Goal: Risk for impaired skin integrity will decrease Outcome: Progressing   Problem: Education: Goal: Knowledge of disease or condition will improve Outcome: Progressing Goal: Understanding of medication regimen will improve Outcome: Progressing Goal: Individualized Educational Video(s) Outcome: Progressing   Problem: Activity: Goal: Ability to tolerate increased activity will improve Outcome: Progressing    Problem: Cardiac: Goal: Ability to achieve and maintain adequate cardiopulmonary perfusion will improve Outcome: Progressing   Problem: Health Behavior/Discharge Planning: Goal: Ability to safely manage health-related needs after discharge will improve Outcome: Progressing

## 2024-02-07 DIAGNOSIS — Z7189 Other specified counseling: Secondary | ICD-10-CM | POA: Diagnosis not present

## 2024-02-07 DIAGNOSIS — Z515 Encounter for palliative care: Secondary | ICD-10-CM | POA: Diagnosis not present

## 2024-02-07 DIAGNOSIS — I472 Ventricular tachycardia, unspecified: Secondary | ICD-10-CM | POA: Diagnosis not present

## 2024-02-07 MED ORDER — LORAZEPAM 0.5 MG PO TABS
0.5000 mg | ORAL_TABLET | ORAL | Status: DC | PRN
  Administered 2024-02-07 – 2024-02-08 (×2): 1 mg via ORAL
  Filled 2024-02-07 (×2): qty 2
  Filled 2024-02-07: qty 1

## 2024-02-07 NOTE — Progress Notes (Signed)
Mobility Specialist Progress Note;   02/07/24 1136  Mobility  Activity Ambulated with assistance in hallway  Level of Assistance Standby assist, set-up cues, supervision of patient - no hands on  Assistive Device Front wheel walker  Distance Ambulated (ft) 300 ft  Activity Response Tolerated well  Mobility Referral Yes  Mobility visit 1 Mobility  Mobility Specialist Start Time (ACUTE ONLY) 1136  Mobility Specialist Stop Time (ACUTE ONLY) 1146  Mobility Specialist Time Calculation (min) (ACUTE ONLY) 10 min   Pt standing at door frame, agreeable to mobility. Required no physical assistance during ambulation, SV. Took 1x standing rest break d/t SOB. VSS throughout. Pt returned safely back to room, left sitting on EOB with all needs met.   Ryan Long Mobility Specialist Please contact via SecureChat or [employer] [phone]

## 2024-02-07 NOTE — TOC Progression Note (Addendum)
Transition of Care [facility]) - Progression Note    Patient Details  Name: Ryan Long MRN: [id number] Date of Birth: 10-02-1961  Transition of Care [facility]) CM/SW Contact  Delilah Shan, LCSWA Phone Number: 02/07/2024, 11:48 AM  Clinical Narrative:     CSW spoke with Tammy with [facility] who confirmed they can offer a SNF bed at [facility].Patient accepted SNF LTC bed offer with [facility]. Tammy is requesting patient be switched to PO ativan. CSW informed MD. Babette Relic is requesting a LOG. Cheryll Dessert supervisor approved for CSW to request LOG to [facility] director for review. Tammy informed CSW once all paperwork complete and LOG received patient can DC over if medically stable. CSW will continue to follow.     Barriers to Discharge: Continued Medical Work up  Expected Discharge Plan and Services       Living arrangements for the past 2 months: Boarding House                                       Social Determinants of Health (SDOH) Interventions SDOH Screenings   Food Insecurity: No Food Insecurity (01/24/2024)  Housing: Low Risk  (01/24/2024)  Recent Concern: Housing - Medium Risk (10/26/2023)   Received from [facility]  Transportation Needs: No Transportation Needs (01/24/2024)  Utilities: Not At Risk (01/24/2024)  Alcohol Screen: Low Risk  (07/17/2023)  Depression (PHQ2-9): Low Risk  (08/01/2023)  Financial Resource Strain: Low Risk  (07/17/2023)  Physical Activity: Insufficiently Active (07/17/2023)  Social Connections: Moderately Integrated (07/17/2023)  Stress: Stress Concern Present (07/17/2023)  Tobacco Use: High Risk (01/24/2024)  Health Literacy: Adequate Health Literacy (07/18/2023)    Readmission Risk Interventions     No data to display

## 2024-02-07 NOTE — Progress Notes (Addendum)
  Patient Name: Ryan Long Date of Encounter: 02/07/2024  Primary Cardiologist: None Electrophysiologist: Charlane Westry [name], MD  Interval Summary   The patient is doing well today. Notes he is fine to stay while waiting on bed placement.  At this time, the patient denies chest pain, shortness of breath, or any new concerns.  Vital Signs    Vitals:   02/06/24 1622 02/06/24 1927 02/07/24 0500 02/07/24 0517  BP: 94/82 102/80  96/77  Pulse:  100  100  Resp:  18  17  Temp:  97.6 F (36.4 C)  97.8 F (36.6 C)  TempSrc:  Oral  Oral  SpO2:      Weight:   87.1 kg   Height:       No intake or output data in the 24 hours ending 02/07/24 0846 Filed Weights   02/05/24 0323 02/06/24 0340 02/07/24 0500  Weight: 86.6 kg 87.2 kg 87.1 kg    Physical Exam    GEN- The patient is well appearing, alert and oriented x 3 today.   Lungs- Clear to ausculation bilaterally, normal work of breathing Cardiac- Regular rate and rhythm, no murmurs, rubs or gallops GI- soft, NT, ND, + BS Extremities- no clubbing or cyanosis. No edema  Telemetry    SR 90's-ST 110's, few episodes of NSVT (personally reviewed)  Hospital Course    Derral Colucci is a 63 y.o. male with PMH of HFrEF admitted for VT storm.   Assessment & Plan    VT  Multiple morphologies  -amiodarone 200mg  BID for one month > then 200mg  daily  -mexiletine 50 mg BID  -tele monitoring   HFrEF  Not a candidate for advanced therapies  -appreciate palliative care   HIV  -continue Biktarvy  Poor Appetite  -marinol   Nausea, Lower ABD Pain  CT abd/pelvis, UA negative  -PRN phenergan    For questions or updates, please contact [facility] Please consult [email or web address] for contact info under Cardiology/STEMI.  Signed, Canary Brim, NP-C, AGACNP-BC [facility] - Electrophysiology  02/07/2024, 8:49 AM  I have seen and examined this patient with Canary Brim.  Agree with above, note added to  reflect my findings.  No acute complaints.  Intermittent shortness of breath but otherwise feeling well.  GEN: Well nourished, well developed, in no acute distress  HEENT: normal  Neck: no JVD, carotid bruits, or masses Cardiac: RRR; no murmurs, rubs, or gallops,no edema  Respiratory:  clear to auscultation bilaterally, normal work of breathing GI: soft, nontender, nondistended, + BS MS: no deformity or atrophy  Skin: warm and dry, device site well healed Neuro:  Strength and sensation are intact Psych: euthymic mood, full affect   VT storm: Had multiple morphologies of VT.  For now, no further episodes.  Currently on amiodarone 200 mg twice daily, mexiletine 250 mg twice daily. Chronic systolic heart failure: No obvious volume overload.  Appreciate palliative care recommendations.  Not a candidate for advanced therapies. HIV: Continue home medications Nausea/lower abdominal pain: Has improved  Edlin Ford M. Cianni Manny MD 02/07/2024 11:50 AM

## 2024-02-07 NOTE — Progress Notes (Signed)
   Palliative Medicine Inpatient Follow Up Note HPI: [name] presents today for evaluation of worsening VT. He is a pleasant 63 year old man with a history of hypertension, depression, chronic systolic heart failure, HIV disease, and a history of polysubstance abuse. Palliative care has been asked to get involved in the setting of end stage heart failure.   Today's Discussion 02/07/2024  *Please note that this is a verbal dictation therefore any spelling or grammatical errors are due to the "Dragon Medical One" system interpretation.  Chart reviewed inclusive of vital signs, progress notes, laboratory results, and diagnostic images.   I met with Ryan Long's RN Ryan Long who notes that he has been well today mostly resting.  I checked in on Ryan Long this morning and afternoon, he was in NAD.   Plan for placement at [facility] tomorrow.   Questions and concerns addressed/Palliative Support Provided.   Objective Assessment: Vital Signs Vitals:   02/07/24 0908 02/07/24 1257  BP:  92/73  Pulse:  (!) 112  Resp:  17  Temp:  97.8 F (36.6 C)  SpO2: 93% 99%   No intake or output data in the 24 hours ending 02/07/24 1354  Last Weight  Most recent update: 02/07/2024  6:30 AM    Weight  87.1 kg (192 lb)            Gen: Older African-American male in no acute distress HEENT: moist mucous membranes CV: Regular rate and rhythm PULM: On RA, breathing is even and nonlabored ABD: soft/nontender EXT: No edema Neuro: Alert and oriented x3  SUMMARY OF RECOMMENDATIONS   DNAR/DNI  MOST/DNR Form Completed, paper copy placed onto the chart electric copy can be found in [facility]  Plan for transition to [employer] tomorrow  Time: 25 ______________________________________________________________________________________ [name] [place] Palliative Medicine Team Team Cell Phone: [phone] Please utilize secure chat with additional questions, if there is no response within 30  minutes please call the above phone number  Palliative Medicine Team providers are available by phone from 7am to 7pm daily and can be reached through the team cell phone.  Should this patient require assistance outside of these hours, please call the patient's attending physician.

## 2024-02-07 NOTE — Progress Notes (Signed)
   This pt has been approved for hospice care at Nursing Facility -- We have been following to see which facility may offer the bed.  I have been contacted by Reyne Dumas with [facility] and have submitted the contract to her so that we can serve pt there at d/c.  Norm Parcel RN [phone]

## 2024-02-07 NOTE — Plan of Care (Signed)
  Problem: Clinical Measurements: Goal: Ability to maintain clinical measurements within normal limits will improve Outcome: Progressing Goal: Diagnostic test results will improve Outcome: Progressing Goal: Respiratory complications will improve Outcome: Progressing   Problem: Activity: Goal: Risk for activity intolerance will decrease Outcome: Progressing   Problem: Nutrition: Goal: Adequate nutrition will be maintained Outcome: Progressing   Problem: Coping: Goal: Level of anxiety will decrease Outcome: Progressing   Problem: Pain Managment: Goal: General experience of comfort will improve and/or be controlled Outcome: Progressing   Problem: Safety: Goal: Ability to remain free from injury will improve Outcome: Progressing

## 2024-02-08 DIAGNOSIS — I472 Ventricular tachycardia, unspecified: Secondary | ICD-10-CM | POA: Diagnosis not present

## 2024-02-08 MED ORDER — DRONABINOL 5 MG PO CAPS: 5.0000 mg | ORAL_CAPSULE | Freq: Two times a day (BID) | ORAL | 3 refills | Status: DC

## 2024-02-08 MED ORDER — EPLERENONE 25 MG PO TABS: 25.0000 mg | ORAL_TABLET | Freq: Every day | ORAL | 3 refills | Status: DC

## 2024-02-08 MED ORDER — POLYETHYLENE GLYCOL 3350 17 G PO PACK: 17.0000 g | PACK | Freq: Every day | ORAL | 0 refills | Status: DC

## 2024-02-08 MED ORDER — LORAZEPAM 0.5 MG PO TABS: 0.5000 mg | ORAL_TABLET | ORAL | 0 refills | Status: DC | PRN

## 2024-02-08 MED ORDER — AMIODARONE HCL 200 MG PO TABS: 200.0000 mg | ORAL_TABLET | Freq: Two times a day (BID) | ORAL | 0 refills | Status: DC

## 2024-02-08 MED ORDER — ONDANSETRON 4 MG PO TBDP
4.0000 mg | ORAL_TABLET | Freq: Once | ORAL | Status: AC
  Administered 2024-02-08: 4 mg via ORAL
  Filled 2024-02-08: qty 1

## 2024-02-08 MED ORDER — MIDODRINE HCL 10 MG PO TABS: 10.0000 mg | ORAL_TABLET | Freq: Three times a day (TID) | ORAL | 6 refills | Status: DC

## 2024-02-08 MED ORDER — PROMETHAZINE HCL 12.5 MG PO TABS
12.5000 mg | ORAL_TABLET | Freq: Once | ORAL | Status: AC
  Administered 2024-02-08: 12.5 mg via ORAL
  Filled 2024-02-08: qty 1

## 2024-02-08 MED ORDER — MEXILETINE HCL 250 MG PO CAPS: 250.0000 mg | ORAL_CAPSULE | Freq: Two times a day (BID) | ORAL | 6 refills | Status: DC

## 2024-02-08 MED ORDER — AMIODARONE HCL 200 MG PO TABS: 200.0000 mg | ORAL_TABLET | Freq: Every day | ORAL | 6 refills | Status: DC

## 2024-02-08 MED ORDER — CLONAZEPAM 0.125 MG PO TBDP: 0.1250 mg | ORAL_TABLET | Freq: Three times a day (TID) | ORAL | 0 refills | Status: DC

## 2024-02-08 MED ORDER — ENSURE ENLIVE PO LIQD
237.0000 mL | Freq: Two times a day (BID) | ORAL | Status: DC
  Administered 2024-02-08 (×2): 237 mL via ORAL

## 2024-02-08 NOTE — TOC Progression Note (Signed)
Transition of Care [facility]) - Progression Note    Patient Details  Name: Samule Life MRN: [id number] Date of Birth: 08-Nov-1961  Transition of Care [facility]) CM/SW Contact  Marliss Coots, LCSW Phone Number: 02/08/2024, 9:41 AM  Clinical Narrative:     9:42 AM Per [facility], patient has concerns regarding SNF LTC payment. CSW spoke with patient to address concerns. Patient informed CSW that he needs to obtain items from home prior to discharge but transportation would need 1-2 days notice, and expressed concerns regarding SNF LTC (Ie., sharing a room).    Barriers to Discharge: Continued Medical Work up  Expected Discharge Plan and Services       Living arrangements for the past 2 months: Boarding House                                       Social Determinants of Health (SDOH) Interventions SDOH Screenings   Food Insecurity: No Food Insecurity (01/24/2024)  Housing: Low Risk  (01/24/2024)  Recent Concern: Housing - Medium Risk (10/26/2023)   Received from [facility]  Transportation Needs: No Transportation Needs (01/24/2024)  Utilities: Not At Risk (01/24/2024)  Alcohol Screen: Low Risk  (07/17/2023)  Depression (PHQ2-9): Low Risk  (08/01/2023)  Financial Resource Strain: Low Risk  (07/17/2023)  Physical Activity: Insufficiently Active (07/17/2023)  Social Connections: Moderately Integrated (07/17/2023)  Stress: Stress Concern Present (07/17/2023)  Tobacco Use: High Risk (01/24/2024)  Health Literacy: Adequate Health Literacy (07/18/2023)    Readmission Risk Interventions     No data to display

## 2024-02-08 NOTE — Progress Notes (Addendum)
  Patient Name: Ryan Long Date of Encounter: 02/08/2024  Primary Cardiologist: None Electrophysiologist: Shawne Bulow [name], MD  Interval Summary   The patient is doing well today.  At this time, the patient denies chest pain, shortness of breath, or any new concerns.  Vital Signs    Vitals:   02/07/24 1431 02/07/24 1554 02/07/24 2023 02/08/24 0616  BP:  (!) 85/64 96/78 94/79   Pulse:   (!) 101 (!) 102  Resp:  17 20 18   Temp:  97.8 F (36.6 C) 97.9 F (36.6 C) 97.9 F (36.6 C)  TempSrc:  Oral Oral Oral  SpO2: 96%  100% 99%  Weight:    86.8 kg  Height:       No intake or output data in the 24 hours ending 02/08/24 0709 Filed Weights   02/06/24 0340 02/07/24 0500 02/08/24 0616  Weight: 87.2 kg 87.1 kg 86.8 kg    Physical Exam    GEN- The patient is well appearing, alert and oriented x 3 today.   Lungs- Clear to ausculation bilaterally, normal work of breathing Cardiac- Regular rate and rhythm, no murmurs, rubs or gallops GI- soft, NT, ND, + BS Extremities- no clubbing or cyanosis. No edema  Telemetry    SR 90's-ST 105 with occ PVC (personally reviewed)  Hospital Course    Ryan Long is a 63 y.o. male  with PMH of HFrEF admitted for VT storm.   Assessment & Plan    VT Multiple morphologies  -continue amiodarone 200 mg BID x1 month, then decrease to 200 mg daily  -mexiletine 50 mg BID  -tele monitoring   HFrEF Not a candidate for advanced therapies  -appreciate Palliative Care   HIV  -Biktarvy  Poor Appetite  -encourage mobilization  -marinol   Nausea, Lower Abd Pain  CT abd/pelvis, UA negative  -PRN phenergan  Dispo: Awaiting bed placement at SNF rehab. Medicaid patient / prolonged disposition due to insurance issues.    For questions or updates, please contact [facility] Please consult [email or web address] for contact info under Cardiology/STEMI.  Signed, Canary Brim, NP-C, AGACNP-BC [facility] -  Electrophysiology  02/08/2024, 7:14 AM  I have seen and examined this patient with Canary Brim.  Agree with above, note added to reflect my findings.  No further VT.  Patient feeling well.  GEN: Well nourished, well developed, in no acute distress  HEENT: normal  Neck: no JVD, carotid bruits, or masses Cardiac: RRR; no murmurs, rubs, or gallops,no edema  Respiratory:  clear to auscultation bilaterally, normal work of breathing GI: soft, nontender, nondistended, + BS MS: no deformity or atrophy  Skin: warm and dry, device site well healed Neuro:  Strength and sensation are intact Psych: euthymic mood, full affect   Ventricular tachycardia: Multiple morphologies.  Currently on amiodarone 200 mg daily, mexiletine 250 mg twice daily.  Zakari Couchman continue with current management. Chronic systolic heart failure: Not a candidate for advanced therapies.  Nettie Cromwell be on hospice at discharge Lower abdominal pain/nausea: Improved.  No UTI on urinalysis, CT scan without abnormality.  Detrice Cales M. Blessed Girdner MD 02/08/2024 2:33 PM

## 2024-02-08 NOTE — Progress Notes (Signed)
Discharge medications entered and I was unable to sign narcotics via [employer]. I entered all medications for discharge.  Ultimately signed by Robet Leu, PA-C.  Patient is enrolled in home hospice.      Canary Brim, NP-C, AGACNP-BC [facility] - Electrophysiology  02/08/2024, 5:19 PM

## 2024-02-08 NOTE — TOC Transition Note (Signed)
Transition of Care [facility]) - Discharge Note   Patient Details  Name: Ryan Long MRN: [id number] Date of Birth: 1961/03/11  Transition of Care [facility]) CM/SW Contact:  Gala Lewandowsky, RN Phone Number: 02/08/2024, 3:54 PM   Clinical Narrative:  Patient has declined SNF with Hospice Services. [facility] will not be able to follow the patient at the [facility]. Case Manager did call [facility] and the patient has been accepted for Hospice Services. Liaison Dahlia Client states the patient can be seen tonight at 6:00 pm and patient is agreeable to services. Discharge lounge to assist with cab transportation home. No further needs identified at this time.   02-08-24 [facility] to order rollator. DME rolling walker ordered via [employer].   Final next level of care: Home w Hospice Care Barriers to Discharge: No Barriers Identified  Patient Goals and CMS Choice Patient states their goals for this hospitalization and ongoing recovery are:: plan to return home with Hospice   Choice offered to / list presented to : Patient (patient did not have preference. [facility] was reviewing unalbe to accept; however, [facility] will accept.)  Discharge Plan and Services Additional resources added to the After Visit Summary for   In-house Referral: NA Discharge Planning Services: CM Consult Post Acute Care Choice: Hospice           HH Arranged: RN [facility] Agency:  Occupational hygienist Hospice) Date [facility] Agency Contacted: 02/08/24 Time HH Agency Contacted: 1551 Representative spoke with at [facility] Agency: Dahlia Client  Social Drivers of Health (SDOH) Interventions SDOH Screenings   Food Insecurity: No Food Insecurity (01/24/2024)  Housing: Low Risk  (01/24/2024)  Recent Concern: Housing - Medium Risk (10/26/2023)   Received from [facility]  Transportation Needs: No Transportation Needs (01/24/2024)  Utilities: Not At Risk (01/24/2024)  Alcohol Screen: Low Risk  (07/17/2023)  Depression (PHQ2-9): Low  Risk  (08/01/2023)  Financial Resource Strain: Low Risk  (07/17/2023)  Physical Activity: Insufficiently Active (07/17/2023)  Social Connections: Moderately Integrated (07/17/2023)  Stress: Stress Concern Present (07/17/2023)  Tobacco Use: High Risk (01/24/2024)  Health Literacy: Adequate Health Literacy (07/18/2023)     Readmission Risk Interventions     No data to display

## 2024-02-08 NOTE — Plan of Care (Signed)
  Problem: Education: Goal: Knowledge of General Education information will improve Description: Including pain rating scale, medication(s)/side effects and non-pharmacologic comfort measures Outcome: Progressing   Problem: Health Behavior/Discharge Planning: Goal: Ability to manage health-related needs will improve Outcome: Progressing   Problem: Clinical Measurements: Goal: Ability to maintain clinical measurements within normal limits will improve Outcome: Progressing Goal: Diagnostic test results will improve Outcome: Progressing Goal: Respiratory complications will improve Outcome: Progressing Goal: Cardiovascular complication will be avoided Outcome: Progressing   Problem: Activity: Goal: Risk for activity intolerance will decrease Outcome: Progressing   Problem: Nutrition: Goal: Adequate nutrition will be maintained Outcome: Progressing   Problem: Coping: Goal: Level of anxiety will decrease Outcome: Progressing   Problem: Elimination: Goal: Will not experience complications related to bowel motility Outcome: Progressing Goal: Will not experience complications related to urinary retention Outcome: Progressing   Problem: Pain Managment: Goal: General experience of comfort will improve and/or be controlled Outcome: Progressing   Problem: Safety: Goal: Ability to remain free from injury will improve Outcome: Progressing   Problem: Skin Integrity: Goal: Risk for impaired skin integrity will decrease Outcome: Progressing   Problem: Education: Goal: Knowledge of disease or condition will improve Outcome: Progressing Goal: Understanding of medication regimen will improve Outcome: Progressing Goal: Individualized Educational Video(s) Outcome: Progressing   Problem: Activity: Goal: Ability to tolerate increased activity will improve Outcome: Progressing   Problem: Cardiac: Goal: Ability to achieve and maintain adequate cardiopulmonary perfusion will  improve Outcome: Progressing   Problem: Health Behavior/Discharge Planning: Goal: Ability to safely manage health-related needs after discharge will improve Outcome: Progressing

## 2024-02-08 NOTE — TOC Progression Note (Addendum)
Transition of Care [facility]) - Progression Note    Patient Details  Name: Ryan Long MRN: [id number] Date of Birth: [birth year]  Transition of Care [facility]) CM/SW Contact  Delilah Shan, LCSWA Phone Number: 02/08/2024, 1:19 PM  Clinical Narrative:     CSW received call from Tammy with [facility] who informed CSW that patient has now decided that he wants to return home with hospice services. CSW and CM followed up with patient at bedside who confirmed he has decided to return home when medically ready for dc.  All questions answered. No further questions reported at this time.CSW informed MD.   Update- CM informed CSW that she spoke with Cheri with [facility] who informed her that they will not be able to provide hospice services at his home. Cheri with [facility] informed CM that she informed patient.      Barriers to Discharge: Continued Medical Work up  Expected Discharge Plan and Services       Living arrangements for the past 2 months: Boarding House                                       Social Determinants of Health (SDOH) Interventions SDOH Screenings   Food Insecurity: No Food Insecurity (01/24/2024)  Housing: Low Risk  (01/24/2024)  Recent Concern: Housing - Medium Risk (10/26/2023)   Received from [facility]  Transportation Needs: No Transportation Needs (01/24/2024)  Utilities: Not At Risk (01/24/2024)  Alcohol Screen: Low Risk  (07/17/2023)  Depression (PHQ2-9): Low Risk  (08/01/2023)  Financial Resource Strain: Low Risk  (07/17/2023)  Physical Activity: Insufficiently Active (07/17/2023)  Social Connections: Moderately Integrated (07/17/2023)  Stress: Stress Concern Present (07/17/2023)  Tobacco Use: High Risk (01/24/2024)  Health Literacy: Adequate Health Literacy (07/18/2023)    Readmission Risk Interventions     No data to display

## 2024-02-11 ENCOUNTER — Ambulatory Visit: Payer: 59 | Admitting: Infectious Disease

## 2024-02-13 ENCOUNTER — Ambulatory Visit: Payer: Self-pay | Admitting: Infectious Disease

## 2024-02-15 ENCOUNTER — Other Ambulatory Visit (HOSPITAL_COMMUNITY): Payer: Self-pay

## 2024-02-15 ENCOUNTER — Telehealth: Payer: Self-pay | Admitting: Pharmacy Technician

## 2024-02-15 NOTE — Telephone Encounter (Signed)
 Pharmacy Patient Advocate Encounter   Received notification from CoverMyMeds that prior authorization for DRONABINOL is required/requested.   Insurance verification completed.   The patient is insured through [employer] .   Per test claim: PA required; PA submitted to above mentioned insurance via CoverMyMeds Key/confirmation #/EOC [id number] Status is pending

## 2024-02-16 ENCOUNTER — Emergency Department (HOSPITAL_COMMUNITY)

## 2024-02-16 ENCOUNTER — Other Ambulatory Visit: Payer: Self-pay

## 2024-02-16 ENCOUNTER — Encounter (HOSPITAL_COMMUNITY): Payer: Self-pay

## 2024-02-16 ENCOUNTER — Inpatient Hospital Stay (HOSPITAL_COMMUNITY)
Admission: EM | Admit: 2024-02-16 | Discharge: 2024-03-07 | DRG: 291 | Disposition: A | Discharge status: Home / Self Care | Attending: Internal Medicine | Admitting: Internal Medicine

## 2024-02-16 DIAGNOSIS — F4310 Post-traumatic stress disorder, unspecified: Secondary | ICD-10-CM | POA: Diagnosis not present

## 2024-02-16 DIAGNOSIS — Z83511 Family history of glaucoma: Secondary | ICD-10-CM

## 2024-02-16 DIAGNOSIS — I472 Ventricular tachycardia, unspecified: Secondary | ICD-10-CM | POA: Diagnosis not present

## 2024-02-16 DIAGNOSIS — R071 Chest pain on breathing: Secondary | ICD-10-CM | POA: Diagnosis not present

## 2024-02-16 DIAGNOSIS — N179 Acute kidney failure, unspecified: Secondary | ICD-10-CM | POA: Diagnosis not present

## 2024-02-16 DIAGNOSIS — I509 Heart failure, unspecified: Secondary | ICD-10-CM | POA: Diagnosis not present

## 2024-02-16 DIAGNOSIS — Z79899 Other long term (current) drug therapy: Secondary | ICD-10-CM

## 2024-02-16 DIAGNOSIS — Z515 Encounter for palliative care: Secondary | ICD-10-CM | POA: Diagnosis not present

## 2024-02-16 DIAGNOSIS — F1990 Other psychoactive substance use, unspecified, uncomplicated: Secondary | ICD-10-CM | POA: Diagnosis not present

## 2024-02-16 DIAGNOSIS — F329 Major depressive disorder, single episode, unspecified: Secondary | ICD-10-CM | POA: Diagnosis not present

## 2024-02-16 DIAGNOSIS — I5023 Acute on chronic systolic (congestive) heart failure: Secondary | ICD-10-CM | POA: Diagnosis not present

## 2024-02-16 DIAGNOSIS — I517 Cardiomegaly: Secondary | ICD-10-CM | POA: Diagnosis not present

## 2024-02-16 DIAGNOSIS — Z7982 Long term (current) use of aspirin: Secondary | ICD-10-CM

## 2024-02-16 DIAGNOSIS — R0603 Acute respiratory distress: Secondary | ICD-10-CM | POA: Diagnosis not present

## 2024-02-16 DIAGNOSIS — Z66 Do not resuscitate: Secondary | ICD-10-CM | POA: Diagnosis not present

## 2024-02-16 DIAGNOSIS — Z7189 Other specified counseling: Secondary | ICD-10-CM

## 2024-02-16 DIAGNOSIS — Z95810 Presence of automatic (implantable) cardiac defibrillator: Secondary | ICD-10-CM

## 2024-02-16 DIAGNOSIS — J4489 Other specified chronic obstructive pulmonary disease: Secondary | ICD-10-CM | POA: Diagnosis not present

## 2024-02-16 DIAGNOSIS — Z813 Family history of other psychoactive substance abuse and dependence: Secondary | ICD-10-CM

## 2024-02-16 DIAGNOSIS — Z7984 Long term (current) use of oral hypoglycemic drugs: Secondary | ICD-10-CM

## 2024-02-16 DIAGNOSIS — R519 Headache, unspecified: Secondary | ICD-10-CM | POA: Diagnosis not present

## 2024-02-16 DIAGNOSIS — F319 Bipolar disorder, unspecified: Secondary | ICD-10-CM | POA: Diagnosis present

## 2024-02-16 DIAGNOSIS — F4323 Adjustment disorder with mixed anxiety and depressed mood: Secondary | ICD-10-CM | POA: Diagnosis not present

## 2024-02-16 DIAGNOSIS — I428 Other cardiomyopathies: Secondary | ICD-10-CM | POA: Diagnosis not present

## 2024-02-16 DIAGNOSIS — I110 Hypertensive heart disease with heart failure: Secondary | ICD-10-CM | POA: Diagnosis not present

## 2024-02-16 DIAGNOSIS — I429 Cardiomyopathy, unspecified: Secondary | ICD-10-CM | POA: Diagnosis not present

## 2024-02-16 DIAGNOSIS — R609 Edema, unspecified: Secondary | ICD-10-CM | POA: Diagnosis not present

## 2024-02-16 DIAGNOSIS — E785 Hyperlipidemia, unspecified: Secondary | ICD-10-CM | POA: Diagnosis present

## 2024-02-16 DIAGNOSIS — I9589 Other hypotension: Secondary | ICD-10-CM | POA: Diagnosis not present

## 2024-02-16 DIAGNOSIS — Z21 Asymptomatic human immunodeficiency virus [HIV] infection status: Secondary | ICD-10-CM | POA: Diagnosis not present

## 2024-02-16 DIAGNOSIS — R45851 Suicidal ideations: Secondary | ICD-10-CM | POA: Diagnosis not present

## 2024-02-16 DIAGNOSIS — F4321 Adjustment disorder with depressed mood: Secondary | ICD-10-CM | POA: Diagnosis not present

## 2024-02-16 DIAGNOSIS — Z5900 Homelessness unspecified: Secondary | ICD-10-CM | POA: Diagnosis not present

## 2024-02-16 DIAGNOSIS — F17210 Nicotine dependence, cigarettes, uncomplicated: Secondary | ICD-10-CM | POA: Diagnosis not present

## 2024-02-16 DIAGNOSIS — J449 Chronic obstructive pulmonary disease, unspecified: Secondary | ICD-10-CM | POA: Diagnosis not present

## 2024-02-16 DIAGNOSIS — N400 Enlarged prostate without lower urinary tract symptoms: Secondary | ICD-10-CM | POA: Diagnosis present

## 2024-02-16 DIAGNOSIS — Z638 Other specified problems related to primary support group: Secondary | ICD-10-CM

## 2024-02-16 DIAGNOSIS — I130 Hypertensive heart and chronic kidney disease with heart failure and stage 1 through stage 4 chronic kidney disease, or unspecified chronic kidney disease: Principal | ICD-10-CM | POA: Diagnosis present

## 2024-02-16 DIAGNOSIS — I5084 End stage heart failure: Secondary | ICD-10-CM | POA: Diagnosis present

## 2024-02-16 DIAGNOSIS — Z8249 Family history of ischemic heart disease and other diseases of the circulatory system: Secondary | ICD-10-CM | POA: Diagnosis not present

## 2024-02-16 DIAGNOSIS — R079 Chest pain, unspecified: Secondary | ICD-10-CM

## 2024-02-16 DIAGNOSIS — N1831 Chronic kidney disease, stage 3a: Secondary | ICD-10-CM | POA: Diagnosis present

## 2024-02-16 DIAGNOSIS — F419 Anxiety disorder, unspecified: Secondary | ICD-10-CM | POA: Diagnosis present

## 2024-02-16 DIAGNOSIS — E876 Hypokalemia: Secondary | ICD-10-CM | POA: Diagnosis not present

## 2024-02-16 DIAGNOSIS — Z811 Family history of alcohol abuse and dependence: Secondary | ICD-10-CM

## 2024-02-16 DIAGNOSIS — Z821 Family history of blindness and visual loss: Secondary | ICD-10-CM

## 2024-02-16 DIAGNOSIS — Z818 Family history of other mental and behavioral disorders: Secondary | ICD-10-CM

## 2024-02-16 DIAGNOSIS — F32A Depression, unspecified: Secondary | ICD-10-CM | POA: Diagnosis not present

## 2024-02-16 DIAGNOSIS — Z62810 Personal history of physical and sexual abuse in childhood: Secondary | ICD-10-CM

## 2024-02-16 DIAGNOSIS — I493 Ventricular premature depolarization: Secondary | ICD-10-CM | POA: Diagnosis present

## 2024-02-16 DIAGNOSIS — Z7951 Long term (current) use of inhaled steroids: Secondary | ICD-10-CM

## 2024-02-16 DIAGNOSIS — Z608 Other problems related to social environment: Secondary | ICD-10-CM | POA: Diagnosis present

## 2024-02-16 DIAGNOSIS — B20 Human immunodeficiency virus [HIV] disease: Secondary | ICD-10-CM | POA: Insufficient documentation

## 2024-02-16 DIAGNOSIS — Z751 Person awaiting admission to adequate facility elsewhere: Secondary | ICD-10-CM

## 2024-02-16 LAB — BASIC METABOLIC PANEL
Anion gap: 10 (ref 5–15)
BUN: 18 mg/dL (ref 8–23)
CO2: 22 mmol/L (ref 22–32)
Calcium: 8.6 mg/dL — ABNORMAL LOW (ref 8.9–10.3)
Chloride: 108 mmol/L (ref 98–111)
Creatinine, Ser: 1.71 mg/dL — ABNORMAL HIGH (ref 0.61–1.24)
GFR, Estimated: 45 mL/min — ABNORMAL LOW (ref >60)
Glucose, Bld: 88 mg/dL (ref 70–99)
Potassium: 4.1 mmol/L (ref 3.5–5.1)
Sodium: 140 mmol/L (ref 135–145)

## 2024-02-16 LAB — CBC
HCT: 54.3 % — ABNORMAL HIGH (ref 39.0–52.0)
Hemoglobin: 18.1 g/dL — ABNORMAL HIGH (ref 13.0–17.0)
MCH: 33.3 pg (ref 26.0–34.0)
MCHC: 33.3 g/dL (ref 30.0–36.0)
MCV: 100 fL (ref 80.0–100.0)
Platelets: 195 10*3/uL (ref 150–400)
RBC: 5.43 MIL/uL (ref 4.22–5.81)
RDW: 15.4 % (ref 11.5–15.5)
WBC: 2.7 10*3/uL — ABNORMAL LOW (ref 4.0–10.5)
nRBC: 0 % (ref 0.0–0.2)

## 2024-02-16 LAB — TROPONIN I (HIGH SENSITIVITY): Troponin I (High Sensitivity): 15 ng/L (ref <18)

## 2024-02-16 LAB — I-STAT CG4 LACTIC ACID, ED: Lactic Acid, Venous: 1.4 mmol/L (ref 0.5–1.9)

## 2024-02-16 NOTE — ED Triage Notes (Signed)
 Pt reports chest pain/shob since this morning. Pt has extensive medical hx. Recently discharged from [facility].

## 2024-02-16 NOTE — ED Provider Notes (Signed)
 [place] EMERGENCY DEPARTMENT AT [facility] Provider Note   CSN: [id number] Arrival date & time: 02/16/24  1916     History {Add pertinent medical, surgical, social history, OB history to HPI:1} Chief Complaint  Patient presents with   Chest Pain    Ryan Long is a 63 y.o. male.  HPI    63 y/o M comes in w/ cc of chest pain. Hx of copd, chf, hiv, ckd. Recent admission for VT storm.  Pt has had chest pain off and on for the last few days, but today the symptoms are pronounced. Chest pain is described as sharp pain, R sided, intermittent, with no specific aggravating, relieving factors or triggers. Pt has associated tingling on L side and discomfort (one time, that lasted for few minutes) which prompted him to come to the ER. He also reports associated shob and    Home Medications Prior to Admission medications   Medication Sig Start Date End Date Taking? Authorizing Provider  albuterol (VENTOLIN HFA) 108 (90 Base) MCG/ACT inhaler Inhale 2 puffs into the lungs every 6 (six) hours as needed for wheezing or shortness of breath. 07/30/23   Storm Frisk, MD  amiodarone (PACERONE) 200 MG tablet Take 1 tablet (200 mg total) by mouth 2 (two) times daily. 02/08/24   Jonita Albee, PA-C  amiodarone (PACERONE) 200 MG tablet Take 1 tablet (200 mg total) by mouth daily. 03/09/24   Jonita Albee, PA-C  aspirin EC 81 MG tablet Take 81 mg by mouth in the morning. Swallow whole.    [provider]  bictegravir-emtricitabine-tenofovir AF (BIKTARVY) 50-200-25 MG TABS tablet Take 1 tablet by mouth daily. Patient taking differently: Take 1 tablet by mouth in the morning. 08/15/23   Randall Hiss, MD  clonazepam (KLONOPIN) 0.125 MG disintegrating tablet Take 1 tablet (0.125 mg total) by mouth 3 (three) times daily. 02/08/24   Jonita Albee, PA-C  dronabinol (MARINOL) 5 MG capsule Take 1 capsule (5 mg total) by mouth 2 (two) times daily before  lunch and supper. 02/09/24   Jonita Albee, PA-C  empagliflozin (JARDIANCE) 10 MG TABS tablet Take 1 tablet (10 mg total) by mouth daily before breakfast. 12/01/22   [place], Anderson Malta, FNP  eplerenone (INSPRA) 25 MG tablet Take 1 tablet (25 mg total) by mouth daily. 02/09/24   Jonita Albee, PA-C  finasteride (PROSCAR) 5 MG tablet TAKE 1 TABLET(5 MG) BY MOUTH DAILY Patient taking differently: Take 5 mg by mouth in the morning. 03/15/23   Storm Frisk, MD  fluticasone furoate-vilanterol (BREO ELLIPTA) 200-25 MCG/ACT AEPB Inhale 1 puff into the lungs daily.    [provider]  IBUPROFEN PO Take 400 mg by mouth every 6 (six) hours as needed for mild pain (pain score 1-3), moderate pain (pain score 4-6) or fever. Liquid gel    [provider]  LORazepam (ATIVAN) 0.5 MG tablet Take 1-2 tablets (0.5-1 mg total) by mouth every 4 (four) hours as needed for anxiety. 02/08/24   Jonita Albee, PA-C  Melatonin 10 MG TABS Take 10 mg by mouth at bedtime.    [provider]  mexiletine (MEXITIL) 250 MG capsule Take 1 capsule (250 mg total) by mouth every 12 (twelve) hours. 02/08/24   Jonita Albee, PA-C  midodrine (PROAMATINE) 10 MG tablet Take 1 tablet (10 mg total) by mouth 3 (three) times daily with meals. 02/09/24   Jonita Albee, PA-C  Multiple Vitamins-Minerals (MENS  MULTIPLUS PO) Take 1 tablet by mouth daily.    [provider]  polyethylene glycol (MIRALAX / GLYCOLAX) 17 g packet Take 17 g by mouth daily. 02/09/24   Jonita Albee, PA-C  tamsulosin (FLOMAX) 0.4 MG CAPS capsule TAKE 2 CAPSULES(0.8 MG) BY MOUTH DAILY AFTER SUPPER Patient taking differently: Take 0.4 mg by mouth 2 (two) times daily. 07/26/23   Storm Frisk, MD  triamcinolone ointment (KENALOG) 0.5 % Apply 1 Application topically 2 (two) times daily. Patient taking differently: Apply 1 Application topically daily. 08/15/23   Storm Frisk, MD  valACYclovir (VALTREX)  500 MG tablet Take 1 tablet (500 mg total) by mouth daily. Patient taking differently: Take 500 mg by mouth in the morning. 11/15/22   Storm Frisk, MD      Allergies    Patient has no known allergies.    Review of Systems   Review of Systems  Physical Exam Updated Vital Signs BP (!) 123/94 (BP Location: Left Arm)   Pulse (!) 115   Temp 97.8 F (36.6 C) (Oral)   Resp (!) 21   Ht 5\' 8"  (1.727 m)   Wt 86.6 kg   SpO2 100%   BMI 29.04 kg/m  Physical Exam  ED Results / Procedures / Treatments   Labs (all labs ordered are listed, but only abnormal results are displayed) Labs Reviewed  BASIC METABOLIC PANEL - Abnormal; Notable for the following components:      Result Value   Creatinine, Ser 1.71 (*)    Calcium 8.6 (*)    GFR, Estimated 45 (*)    All other components within normal limits  CBC - Abnormal; Notable for the following components:   WBC 2.7 (*)    Hemoglobin 18.1 (*)    HCT 54.3 (*)    All other components within normal limits  CULTURE, BLOOD (ROUTINE X 2)  CULTURE, BLOOD (ROUTINE X 2)  URINALYSIS, W/ REFLEX TO CULTURE (INFECTION SUSPECTED)  PROTIME-INR  I-STAT CG4 LACTIC ACID, ED  I-STAT CG4 LACTIC ACID, ED  TROPONIN I (HIGH SENSITIVITY)  TROPONIN I (HIGH SENSITIVITY)    EKG None  Radiology DG Chest 2 View Result Date: 02/16/2024 CLINICAL DATA:  Chest pain shortness of breath for several hours, initial encounter EXAM: CHEST - 2 VIEW COMPARISON:  01/23/2024 FINDINGS: Cardiac shadow is enlarged but stable. Defibrillator is again seen. Lungs are well aerated bilaterally. No bony abnormality is noted. IMPRESSION: No active cardiopulmonary disease. Electronically Signed   By: Alcide Clever M.D.   On: 02/16/2024 20:40    Procedures Procedures  {Document cardiac monitor, telemetry assessment procedure when appropriate:1}  Medications Ordered in ED Medications - No data to display  ED Course/ Medical Decision Making/ A&P   {   Click here for ABCD2,  HEART and other calculatorsREFRESH Note before signing :1}                              Medical Decision Making Amount and/or Complexity of Data Reviewed Labs: ordered. Radiology: ordered.   ***  {Document critical care time when appropriate:1} {Document review of labs and clinical decision tools ie heart score, Chads2Vasc2 etc:1}  {Document your independent review of radiology images, and any outside records:1} {Document your discussion with family members, caretakers, and with consultants:1} {Document social determinants of health affecting pt's care:1} {Document your decision making why or why not admission, treatments were needed:1} Final Clinical Impression(s) / ED Diagnoses  Final diagnoses:  None    Rx / DC Orders ED Discharge Orders     None

## 2024-02-17 ENCOUNTER — Encounter (HOSPITAL_COMMUNITY): Payer: Self-pay | Admitting: Internal Medicine

## 2024-02-17 ENCOUNTER — Observation Stay (HOSPITAL_BASED_OUTPATIENT_CLINIC_OR_DEPARTMENT_OTHER)

## 2024-02-17 DIAGNOSIS — F1990 Other psychoactive substance use, unspecified, uncomplicated: Secondary | ICD-10-CM | POA: Diagnosis not present

## 2024-02-17 DIAGNOSIS — Z515 Encounter for palliative care: Secondary | ICD-10-CM | POA: Diagnosis not present

## 2024-02-17 DIAGNOSIS — F4323 Adjustment disorder with mixed anxiety and depressed mood: Secondary | ICD-10-CM | POA: Diagnosis not present

## 2024-02-17 DIAGNOSIS — I5023 Acute on chronic systolic (congestive) heart failure: Secondary | ICD-10-CM | POA: Diagnosis not present

## 2024-02-17 DIAGNOSIS — R609 Edema, unspecified: Secondary | ICD-10-CM | POA: Diagnosis not present

## 2024-02-17 DIAGNOSIS — F4310 Post-traumatic stress disorder, unspecified: Secondary | ICD-10-CM | POA: Diagnosis not present

## 2024-02-17 DIAGNOSIS — R079 Chest pain, unspecified: Secondary | ICD-10-CM

## 2024-02-17 DIAGNOSIS — I429 Cardiomyopathy, unspecified: Secondary | ICD-10-CM

## 2024-02-17 DIAGNOSIS — Z7189 Other specified counseling: Secondary | ICD-10-CM | POA: Diagnosis not present

## 2024-02-17 DIAGNOSIS — N179 Acute kidney failure, unspecified: Secondary | ICD-10-CM

## 2024-02-17 DIAGNOSIS — F32A Depression, unspecified: Secondary | ICD-10-CM | POA: Diagnosis not present

## 2024-02-17 LAB — COMPREHENSIVE METABOLIC PANEL
ALT: 45 U/L — ABNORMAL HIGH (ref 0–44)
AST: 37 U/L (ref 15–41)
Albumin: 2.9 g/dL — ABNORMAL LOW (ref 3.5–5.0)
Alkaline Phosphatase: 65 U/L (ref 38–126)
Anion gap: 11 (ref 5–15)
BUN: 17 mg/dL (ref 8–23)
CO2: 19 mmol/L — ABNORMAL LOW (ref 22–32)
Calcium: 7.9 mg/dL — ABNORMAL LOW (ref 8.9–10.3)
Chloride: 109 mmol/L (ref 98–111)
Creatinine, Ser: 1.49 mg/dL — ABNORMAL HIGH (ref 0.61–1.24)
GFR, Estimated: 53 mL/min — ABNORMAL LOW (ref >60)
Glucose, Bld: 73 mg/dL (ref 70–99)
Potassium: 3.6 mmol/L (ref 3.5–5.1)
Sodium: 139 mmol/L (ref 135–145)
Total Bilirubin: 2.2 mg/dL — ABNORMAL HIGH (ref 0.0–1.2)
Total Protein: 5.6 g/dL — ABNORMAL LOW (ref 6.5–8.1)

## 2024-02-17 LAB — RAPID URINE DRUG SCREEN, HOSP PERFORMED
Amphetamines: NOT DETECTED
Barbiturates: NOT DETECTED
Benzodiazepines: NOT DETECTED
Cocaine: NOT DETECTED
Opiates: NOT DETECTED
Tetrahydrocannabinol: NOT DETECTED

## 2024-02-17 LAB — URINALYSIS, W/ REFLEX TO CULTURE (INFECTION SUSPECTED)
Bilirubin Urine: NEGATIVE
Glucose, UA: 150 mg/dL — AB
Hgb urine dipstick: NEGATIVE
Ketones, ur: NEGATIVE mg/dL
Leukocytes,Ua: NEGATIVE
Nitrite: NEGATIVE
Protein, ur: NEGATIVE mg/dL
Specific Gravity, Urine: 1.005 (ref 1.005–1.030)
pH: 5 (ref 5.0–8.0)

## 2024-02-17 LAB — TROPONIN I (HIGH SENSITIVITY)
Troponin I (High Sensitivity): 14 ng/L (ref <18)
Troponin I (High Sensitivity): 13 ng/L (ref <18)

## 2024-02-17 LAB — PROTIME-INR
INR: 1.2 (ref 0.8–1.2)
Prothrombin Time: 15.3 s — ABNORMAL HIGH (ref 11.4–15.2)

## 2024-02-17 LAB — HEPARIN LEVEL (UNFRACTIONATED): Heparin Unfractionated: 0.9 [IU]/mL — ABNORMAL HIGH (ref 0.30–0.70)

## 2024-02-17 LAB — D-DIMER, QUANTITATIVE: D-Dimer, Quant: 8.27 ug{FEU}/mL — ABNORMAL HIGH (ref 0.00–0.50)

## 2024-02-17 LAB — BRAIN NATRIURETIC PEPTIDE: B Natriuretic Peptide: 1959.4 pg/mL — ABNORMAL HIGH (ref 0.0–100.0)

## 2024-02-17 MED ORDER — DRONABINOL 5 MG PO CAPS
5.0000 mg | ORAL_CAPSULE | Freq: Two times a day (BID) | ORAL | Status: DC
  Administered 2024-02-17 – 2024-03-07 (×37): 5 mg via ORAL
  Filled 2024-02-17 (×38): qty 1

## 2024-02-17 MED ORDER — BICTEGRAVIR-EMTRICITAB-TENOFOV 50-200-25 MG PO TABS
1.0000 | ORAL_TABLET | Freq: Every day | ORAL | Status: DC
  Administered 2024-02-18 – 2024-03-07 (×18): 1 via ORAL
  Filled 2024-02-17 (×19): qty 1

## 2024-02-17 MED ORDER — FUROSEMIDE 10 MG/ML IJ SOLN
40.0000 mg | Freq: Every day | INTRAMUSCULAR | Status: DC
  Administered 2024-02-17: 40 mg via INTRAVENOUS
  Filled 2024-02-17: qty 4

## 2024-02-17 MED ORDER — SPIRONOLACTONE 25 MG PO TABS
25.0000 mg | ORAL_TABLET | Freq: Every day | ORAL | Status: DC
  Administered 2024-02-17 – 2024-02-23 (×5): 25 mg via ORAL
  Filled 2024-02-17 (×7): qty 1

## 2024-02-17 MED ORDER — CLONAZEPAM 0.125 MG PO TBDP
0.1250 mg | ORAL_TABLET | Freq: Three times a day (TID) | ORAL | Status: DC
  Administered 2024-02-17 – 2024-03-07 (×56): 0.125 mg via ORAL
  Filled 2024-02-17 (×58): qty 1

## 2024-02-17 MED ORDER — MIRTAZAPINE 15 MG PO TABS
7.5000 mg | ORAL_TABLET | Freq: Every day | ORAL | Status: DC
  Administered 2024-02-17 – 2024-02-19 (×3): 7.5 mg via ORAL
  Filled 2024-02-17 (×3): qty 1

## 2024-02-17 MED ORDER — FLUTICASONE FUROATE-VILANTEROL 200-25 MCG/ACT IN AEPB
1.0000 | INHALATION_SPRAY | Freq: Every day | RESPIRATORY_TRACT | Status: DC
  Administered 2024-02-19 – 2024-03-07 (×17): 1 via RESPIRATORY_TRACT
  Filled 2024-02-17 (×2): qty 28

## 2024-02-17 MED ORDER — AMIODARONE HCL 200 MG PO TABS: 200.0000 mg | ORAL_TABLET | Freq: Two times a day (BID) | ORAL | Status: DC

## 2024-02-17 MED ORDER — FINASTERIDE 5 MG PO TABS
5.0000 mg | ORAL_TABLET | Freq: Every day | ORAL | Status: DC
  Administered 2024-02-17 – 2024-03-07 (×19): 5 mg via ORAL
  Filled 2024-02-17 (×20): qty 1

## 2024-02-17 MED ORDER — ACETAMINOPHEN 650 MG RE SUPP: 650.0000 mg | Freq: Four times a day (QID) | RECTAL | Status: DC | PRN

## 2024-02-17 MED ORDER — ALBUTEROL SULFATE HFA 108 (90 BASE) MCG/ACT IN AERS: 2.0000 | INHALATION_SPRAY | Freq: Four times a day (QID) | RESPIRATORY_TRACT | Status: DC | PRN

## 2024-02-17 MED ORDER — MEXILETINE HCL 250 MG PO CAPS
250.0000 mg | ORAL_CAPSULE | Freq: Two times a day (BID) | ORAL | Status: DC
  Administered 2024-02-17 – 2024-03-07 (×38): 250 mg via ORAL
  Filled 2024-02-17 (×40): qty 1

## 2024-02-17 MED ORDER — ACETAMINOPHEN 325 MG PO TABS
650.0000 mg | ORAL_TABLET | Freq: Four times a day (QID) | ORAL | Status: DC | PRN
  Administered 2024-02-19 – 2024-02-23 (×7): 650 mg via ORAL
  Filled 2024-02-17 (×7): qty 2

## 2024-02-17 MED ORDER — HEPARIN (PORCINE) 25000 UT/250ML-% IV SOLN
1150.0000 [IU]/h | INTRAVENOUS | Status: DC
  Administered 2024-02-17: 1400 [IU]/h via INTRAVENOUS
  Administered 2024-02-18: 1250 [IU]/h via INTRAVENOUS
  Filled 2024-02-17 (×2): qty 250

## 2024-02-17 MED ORDER — AMIODARONE HCL 200 MG PO TABS
200.0000 mg | ORAL_TABLET | Freq: Two times a day (BID) | ORAL | Status: DC
  Administered 2024-02-17 – 2024-02-29 (×21): 200 mg via ORAL
  Filled 2024-02-17 (×24): qty 1

## 2024-02-17 MED ORDER — HEPARIN BOLUS VIA INFUSION
2500.0000 [IU] | Freq: Once | INTRAVENOUS | Status: AC
  Administered 2024-02-17: 2500 [IU] via INTRAVENOUS
  Filled 2024-02-17: qty 2500

## 2024-02-17 MED ORDER — TAMSULOSIN HCL 0.4 MG PO CAPS
0.4000 mg | ORAL_CAPSULE | Freq: Two times a day (BID) | ORAL | Status: DC
  Administered 2024-02-17 – 2024-03-07 (×38): 0.4 mg via ORAL
  Filled 2024-02-17 (×39): qty 1

## 2024-02-17 MED ORDER — POTASSIUM CHLORIDE CRYS ER 20 MEQ PO TBCR
40.0000 meq | EXTENDED_RELEASE_TABLET | Freq: Once | ORAL | Status: AC
  Administered 2024-02-17: 40 meq via ORAL
  Filled 2024-02-17: qty 2

## 2024-02-17 MED ORDER — ENOXAPARIN SODIUM 40 MG/0.4ML IJ SOSY: 40.0000 mg | PREFILLED_SYRINGE | INTRAMUSCULAR | Status: DC

## 2024-02-17 MED ORDER — FUROSEMIDE 10 MG/ML IJ SOLN
40.0000 mg | Freq: Once | INTRAMUSCULAR | Status: AC
  Administered 2024-02-17: 40 mg via INTRAVENOUS
  Filled 2024-02-17: qty 4

## 2024-02-17 MED ORDER — ALBUTEROL SULFATE (2.5 MG/3ML) 0.083% IN NEBU
2.5000 mg | INHALATION_SOLUTION | Freq: Four times a day (QID) | RESPIRATORY_TRACT | Status: DC | PRN
  Administered 2024-02-18 – 2024-03-06 (×26): 2.5 mg via RESPIRATORY_TRACT
  Filled 2024-02-17 (×25): qty 3

## 2024-02-17 MED ORDER — VALACYCLOVIR HCL 500 MG PO TABS
500.0000 mg | ORAL_TABLET | Freq: Every day | ORAL | Status: DC
  Administered 2024-02-18 – 2024-03-07 (×18): 500 mg via ORAL
  Filled 2024-02-17 (×19): qty 1

## 2024-02-17 MED ORDER — ASPIRIN 81 MG PO TBEC
81.0000 mg | DELAYED_RELEASE_TABLET | Freq: Every day | ORAL | Status: DC
  Administered 2024-02-17 – 2024-03-07 (×19): 81 mg via ORAL
  Filled 2024-02-17 (×20): qty 1

## 2024-02-17 MED ORDER — MIDODRINE HCL 5 MG PO TABS
10.0000 mg | ORAL_TABLET | Freq: Three times a day (TID) | ORAL | Status: DC
  Administered 2024-02-17 – 2024-03-06 (×54): 10 mg via ORAL
  Filled 2024-02-17 (×54): qty 2

## 2024-02-17 MED ORDER — EMPAGLIFLOZIN 10 MG PO TABS
10.0000 mg | ORAL_TABLET | Freq: Every day | ORAL | Status: DC
Start: 2024-02-17 — End: 2024-03-07
  Administered 2024-02-18 – 2024-03-07 (×18): 10 mg via ORAL
  Filled 2024-02-17 (×20): qty 1

## 2024-02-17 MED ORDER — FUROSEMIDE 10 MG/ML IJ SOLN
40.0000 mg | Freq: Two times a day (BID) | INTRAMUSCULAR | Status: DC
  Administered 2024-02-17 – 2024-02-23 (×12): 40 mg via INTRAVENOUS
  Filled 2024-02-17 (×13): qty 4

## 2024-02-17 MED ORDER — POLYETHYLENE GLYCOL 3350 17 G PO PACK
17.0000 g | PACK | Freq: Every day | ORAL | Status: DC
  Administered 2024-02-17 – 2024-03-07 (×12): 17 g via ORAL
  Filled 2024-02-17 (×19): qty 1

## 2024-02-17 NOTE — Progress Notes (Signed)
 PROGRESS NOTE    Ryan Long  ZOX:[id number] DOB: [birth year] DOA: 02/16/2024 PCP: Cristino Martes, NP    Brief Narrative:   Ryan Long is a 63 y.o. male with past medical history significant for chronic HFrEF/end-stage cardiomyopathy status post AICD, NSVT, HIV, hypertension, hyperlipidemia, COPD, anxiety, depression, bipolar disorder, substance abuse, BPH, CKD stage IIIa who presented to [facility] ED on 02/16/2024 with complaints of chest pain, shortness of breath. Patient states since he left the hospital last time he has continued to have shortness of breath, heart palpitations, lightheadedness, and substernal chest pain. Symptoms have been worse for the past few days prompting him to come into the ED to be evaluated. He is taking his home medications as prescribed. Patient states he was previously advised to go to a skilled nursing facility and had declined but now regrets his decision. He is tearful and concerned that his advanced heart failure is affecting the quality of his life. Patient mentions having thoughts of walking to the street and jumping in front of a car to end his life and states he has not been able to do so as he is barely able to walk due to shortness of breath.   In the ED, Temperature 97.8 F, pulse 115, respiratory rate 21, blood pressure 123/94, and SpO2 100% on room air. Labs showing WBC count 2.7 (was 3.4 on labs 3 weeks ago), hemoglobin 18.1 (previously elevated as well and stable), creatinine 1.7 (was 1.4 on labs 2 weeks ago), BNP 1959, troponin negative, lactic acid normal, blood cultures collected, UDS pending. Chest x-ray showing no active cardiopulmonary disease. ED physician discussed the case with on-call cardiologist Dr. Orson Aloe who recommended admitting the patient to [facility] by hospitalist service and requested that cardiology team is consulted during daytime. Patient was given IV Lasix 40 mg. TRH called to admit.   Assessment &  Plan:   Acute on chronic HFrEF/end-stage nonischemic cardiomyopathy TTE October 2024 showing EF 20 to 25%.  Patient was recently admitted 2/5-2/21 for worsening heart failure symptoms and VT storm.  Palliative care was consulted and patient had decided to pursue hospice so his ICD was inactivated.  He is now presenting with worsening dyspnea on exertion and lower extremity edema. BNP elevated at 1959.  Chest x-ray not suggestive of pulmonary edema.  -- Cardiology following, appreciate assistance -- Lasix 40 mg IV every 12 hours -- Spironolactone 25 mg p.o. daily -- Jardiance 10 mg p.o. daily -- Midodrine 10 mg p.o. 3 times daily for blood pressure support -- 1200 mL fluid restriction -- Strict I's and O's and daily weights -- BMP, BNP daily   Chest pain/elevated D-dimer Troponin x negative and EKG without acute ischemic changes.  Recent cath done in January showing normal coronaries. ?PE but patient is not hypoxic, satting 100% on room air.  Avoiding CT with contrast given CKD.  UDS negative. -- starting heparin drip for concern of embolism, elevated D-dimer -- Nuclear medicine VQ scan: Pending -- Vascular duplex ultrasound bilateral lower extremities: Pending   AKI on CKD stage IIIa Likely cardiorenal in etiology.  Creatinine was 1.4 on labs 2 weeks ago and now increased to 1.7 on admission; likely secondary to decompensated CHF as above. -- Cr 1.71>1.49 -- Lasix as above -- BMP daily   Suicidal ideation History of anxiety, depression, bipolar disorder Patient is tearful and concerned that his advanced heart failure is affecting the quality of his life.  He mentions having thoughts of walking to  the street and jumping in front of a car to end his life and states he has not been able to do so as he is barely able to walk due to shortness of breath.  He also repeatedly told RN that he is having thoughts of jumping in front of a car to end his life.   -- Psychiatry following, appreciate  assistance -- Clonazepam 0.125 mg p.o. 3 times daily -- Remeron 7.5 g p.o. nightly -- Telemetry sitter, suicide precautions   History of VT Continue amiodarone 200 mg  PO BID and mexiletine 250 mg PO BID. -- Monitor on telemetry   HIV He is on Biktarvy.  Also on chronic Valtrex due to history of recurrent HSV.   COPD Stable, no wheezing.   -- Breo Ellipta 1 puff daily --Albuterol neb every 6 hours as needed wheezing/shortness of breath   BPH Continue home medications.  Goals of care: Patient recently transition to hospice.  During previous admission treatment team seeking placement [facility] patient declined.  Patient is regretful not seeking placement as he needs a lot of care and now amenable. --Palliative care following, appreciate assistance   DVT prophylaxis:     Code Status: Limited: Do not attempt resuscitation (DNR) -DNR-LIMITED -Do Not Intubate/DNI  Family Communication:   Disposition Plan:  Level of care: Progressive Status is: Observation The patient remains OBS appropriate and will d/c before 2 midnights.    Consultants:  Cardiology Psychiatry Palliative care  Procedures:  Vascular duplex ultrasound bilateral extremities: Pending NM VQ scan: Pending  Antimicrobials:  None   Subjective: Patient seen examined bedside, lying in bed.  Remains in ED holding area.  Tearful.  Continues to endorse SI.  Continues with intermittent chest pain, palpitations, shortness of breath.  Discussed with patient findings on lab work and imaging.  Elevated D-dimer concern for clots and will obtain VQ scan given his renal function as well as ultrasound of his lower extremities.  Started on heparin drip.  Seen by cardiology, increased Lasix to 40 IV twice daily.  Regretful of not seeking placement during previous admission given his significant and need for assistance.  Appreciative all the care he is receiving.  No other specific complaints, concerns or questions at this  time.  Denies headache, no dizziness, no abdominal pain, no cough/congestion, no fever/chills/night sweats, no nausea/vomiting/diarrhea, no focal weakness, no fatigue, no paresthesia.  Objective: Vitals:   02/17/24 0615 02/17/24 0700 02/17/24 0841 02/17/24 1054  BP: (!) 144/111 (!) 148/98 109/83   Pulse: 88  95   Resp: (!) 26 12 20    Temp:  97.9 F (36.6 C) (!) 97.5 F (36.4 C)   TempSrc:  Oral Oral   SpO2: 98% 100% 100%   Weight:    82.4 kg  Height:    5\' 8"  (1.727 m)    Intake/Output Summary (Last 24 hours) at 02/17/2024 1322 Last data filed at 02/17/2024 9562 Gross per 24 hour  Intake --  Output 2525 ml  Net -2525 ml   Filed Weights   02/16/24 1929 02/16/24 1934 02/17/24 1054  Weight: 86.6 kg 86.6 kg 82.4 kg    Examination:  Physical Exam: GEN: NAD, alert and oriented x 3, chronically ill appearance, appears older than stated age HEENT: NCAT, PERRL, EOMI, sclera clear, MMM PULM: Breath sounds slight diminished bilateral bases, no wheezes/crackles, normal respiratory effort without accessory muscle use, on room air with SpO2 98% at rest CV: RRR w/o M/G/R GI: abd soft, NTND, + BS MSK: +  LE peripheral edema, moves all extremities dependently NEURO: No focal neurological deficit PSYCH: Depressed mood, flat affect, + SI, denies HI Integumentary: No concerning rashes/lesions/wounds noted on exposed skin surfaces    Data Reviewed: I have personally reviewed following labs and imaging studies  CBC: Recent Labs  Lab 02/16/24 1930  WBC 2.7*  HGB 18.1*  HCT 54.3*  MCV 100.0  PLT 195   Basic Metabolic Panel: Recent Labs  Lab 02/16/24 1930 02/17/24 0615  NA 140 139  K 4.1 3.6  CL 108 109  CO2 22 19*  GLUCOSE 88 73  BUN 18 17  CREATININE 1.71* 1.49*  CALCIUM 8.6* 7.9*   GFR: Estimated Creatinine Clearance: 53.8 mL/min (A) (by C-G formula based on SCr of 1.49 mg/dL (H)). Liver Function Tests: Recent Labs  Lab 02/17/24 0615  AST 37  ALT 45*  ALKPHOS 65   BILITOT 2.2*  PROT 5.6*  ALBUMIN 2.9*   No results for input(s): "LIPASE", "AMYLASE" in the last 168 hours. No results for input(s): "AMMONIA" in the last 168 hours. Coagulation Profile: Recent Labs  Lab 02/17/24 1027  INR 1.2   Cardiac Enzymes: No results for input(s): "CKTOTAL", "CKMB", "CKMBINDEX", "TROPONINI" in the last 168 hours. BNP (last 3 results) No results for input(s): "PROBNP" in the last 8760 hours. HbA1C: No results for input(s): "HGBA1C" in the last 72 hours. CBG: No results for input(s): "GLUCAP" in the last 168 hours. Lipid Profile: No results for input(s): "CHOL", "HDL", "LDLCALC", "TRIG", "CHOLHDL", "LDLDIRECT" in the last 72 hours. Thyroid Function Tests: No results for input(s): "TSH", "T4TOTAL", "FREET4", "T3FREE", "THYROIDAB" in the last 72 hours. Anemia Panel: No results for input(s): "VITAMINB12", "FOLATE", "FERRITIN", "TIBC", "IRON", "RETICCTPCT" in the last 72 hours. Sepsis Labs: Recent Labs  Lab 02/16/24 2049  LATICACIDVEN 1.4    Recent Results (from the past 240 hours)  Culture, blood (Routine x 2)     Status: None (Preliminary result)   Collection Time: 02/16/24  8:40 PM   Specimen: BLOOD  Result Value Ref Range Status   Specimen Description   Final    BLOOD RIGHT ANTECUBITAL Performed at [facility], [address], [place], [place] [address]    Special Requests   Final    BOTTLES DRAWN AEROBIC AND ANAEROBIC Blood Culture adequate volume Performed at [facility], [address] [place], [place] [address]    Culture   Final    NO GROWTH < 12 HOURS Performed at [facility], [address], [place], [place] [address]    Report Status PENDING  Incomplete  Culture, blood (Routine x 2)     Status: None (Preliminary result)   Collection Time: 02/16/24  9:00 PM   Specimen: BLOOD  Result Value Ref Range Status   Specimen Description   Final    BLOOD LEFT ANTECUBITAL Performed at [facility], [address], [place], [place] [address]    Special Requests   Final    BOTTLES DRAWN AEROBIC AND ANAEROBIC Blood Culture adequate volume Performed at [facility], [address] [place], [place] [address]    Culture   Final    NO GROWTH < 12 HOURS Performed at [facility], [address], [place], [place] [address]    Report Status PENDING  Incomplete         Radiology Studies: DG Chest 2 View Result Date: 02/16/2024 CLINICAL DATA:  Chest pain shortness of breath for several  hours, initial encounter EXAM: CHEST - 2 VIEW COMPARISON:  01/23/2024 FINDINGS: Cardiac shadow is enlarged but stable. Defibrillator is again seen. Lungs are well aerated bilaterally. No bony abnormality is noted. IMPRESSION: No active cardiopulmonary disease. Electronically Signed   By: Alcide Clever M.D.   On: 02/16/2024 20:40        Scheduled Meds:  amiodarone  200 mg Oral BID   aspirin EC  81 mg Oral Daily   [START ON 02/18/2024] bictegravir-emtricitabine-tenofovir AF  1 tablet Oral Daily   clonazepam  0.125 mg Oral TID   dronabinol  5 mg Oral BID AC   empagliflozin  10 mg Oral QAC breakfast   finasteride  5 mg Oral Daily   fluticasone furoate-vilanterol  1 puff Inhalation Daily   furosemide  40 mg Intravenous BID   mexiletine  250 mg Oral Q12H   midodrine  10 mg Oral TID WC   mirtazapine  7.5 mg Oral QHS   polyethylene glycol  17 g Oral Daily   spironolactone  25 mg Oral Daily   tamsulosin  0.4 mg Oral BID   [START ON 02/18/2024] valACYclovir  500 mg Oral Daily   Continuous Infusions:  heparin 1,400 Units/hr (02/17/24 1047)     LOS: 0 days    Time spent: 56 minutes spent on chart review, discussion with nursing staff, consultants, updating family and interview/physical exam; more than 50% of that time was spent in counseling and/or coordination of care.    Alvira Philips [place], DO [facility] Available via Epic secure chat  7am-7pm After these hours, please refer to coverage provider listed on [email or web address] 02/17/2024, 1:22 PM

## 2024-02-17 NOTE — Progress Notes (Signed)
 PHARMACY - ANTICOAGULATION CONSULT NOTE  Pharmacy Consult for Heparin Indication:  r/o PE  No Known Allergies  Patient Measurements: Height: 5\' 8"  (172.7 cm) Weight: 82.4 kg (181 lb 10.5 oz) IBW/kg (Calculated) : 68.4 Heparin Dosing Weight: 82.4 kg  Vital Signs: Temp: 97.4 F (36.3 C) (03/02 1739) Temp Source: Oral (03/02 1739) BP: 98/77 (03/02 1739) Pulse Rate: 95 (03/02 1739)  Labs: Recent Labs    02/16/24 1930 02/17/24 0615 02/17/24 1027 02/17/24 1943  HGB 18.1*  --   --   --   HCT 54.3*  --   --   --   PLT 195  --   --   --   LABPROT  --   --  15.3*  --   INR  --   --  1.2  --   HEPARINUNFRC  --   --   --  0.90*  CREATININE 1.71* 1.49*  --   --   TROPONINIHS 15 13  --   --     Estimated Creatinine Clearance: 53.8 mL/min (A) (by C-G formula based on SCr of 1.49 mg/dL (H)).   Medical History: Past Medical History:  Diagnosis Date   Active smoker    AICD (automatic cardioverter/defibrillator) present    Alcohol abuse    Allergy July 2023   Anxiety    AR (allergic rhinitis)    Bipolar 1 disorder (HCC)    Cervical lymphadenitis 04/20/2021   CHF (congestive heart failure) (HCC)    Chronic bronchitis (HCC)    Chronic systolic heart failure (HCC)    Controlled substance agreement signed 10/22/2018   COPD (chronic obstructive pulmonary disease) (HCC)    COPD (chronic obstructive pulmonary disease) (HCC) 10/04/2015   Cough 12/31/2018   Crack cocaine use    Depression    Genital herpes    HIV (human immunodeficiency virus infection) (HCC) dx'd 08/1993   HLD (hyperlipidemia)    Hypertension    NICM (nonischemic cardiomyopathy) (HCC)    Echocardiogram 06/28/11: EF 30-35%, mild MR, mild LAE;  No CAD by coronary CT angiogram 3/12 at [facility]   NSVT (nonsustained ventricular tachycardia) (HCC)    PTSD (post-traumatic stress disorder)    Assessment: AC/Heme: r/o PE>> IV heparin Elevated d-dimer 8.27 - Doppler US negative for DVT - VQ  ordered - Hep level 0.9 elevated  Goal of Therapy:  Heparin level 0.3-0.7 units/ml Monitor platelets by anticoagulation protocol: Yes   Plan:  Decrease IV heparin to 1250 units/hr Daily HL and CBC   Jonan Seufert S. Merilynn Finland, PharmD, BCPS Clinical Staff Pharmacist Misty Stanley Stillinger 02/17/2024,9:02 PM

## 2024-02-17 NOTE — Consult Note (Signed)
 Cardiology Consultation   Patient ID: Ryan Long MRN: [id number]; DOB: 1961/03/13  Admit date: 02/16/2024 Date of Consult: 02/17/2024  PCP:  Cristino Martes, NP   [facility] Providers Cardiologist:  None  Electrophysiologist:  Will [name], MD       Patient Profile:   Ryan Long is a 63 y.o. male with a hx of HIV, hypertension, depression with chronic systolic heart failure, nonischemic, polysubstance abuse, now palliative after recent episode of VT storm who is being seen 02/17/2024 for the evaluation of decompensated heart failure at the request of Dr. [place].  History of Present Illness:   [name] presents after recent hospitalization.  He is here with acute shortness of breath, lower extremity edema and BNP of 1959.  Chest x-ray does not show any significant pulmonary edema.  He is hemodynamically stable and on room air.  He was admitted on 01/23/2024.  He had VT storm and was placed on an amiodarone drip.  He had a recent right and left heart cath which showed no obstructive CAD and no significant VO.  His VT at that time was below his device detection zone.  During this time he was hypotensive and there was concern for low output.  Unfortunately he is not advanced therapies candidate due to difficulty with compliance and lack of social support. He was deemed end stage CHF and with increased VT burden.  He was made DNR/DNI.  Palliative was consulted and hospice care was discussed.  At that time patient wished to pursue hospice.  His ICD tachycardia therapies were turned off.  At that time he did not want to return to his group home and plan to find placement at hospice.   He is a heart failure patient and is being followed by EP.  He has had systolic heart failure since 2018.  He has been managed on GDMT.  He is status post Saint Jude dual-chamber ICD.  Unfortunately he has had difficulty with taking medications and cocaine abuse.  He also has  longstanding HIV.    Past Medical History:  Diagnosis Date   Active smoker    AICD (automatic cardioverter/defibrillator) present    Alcohol abuse    Allergy July 2023   Anxiety    AR (allergic rhinitis)    Bipolar 1 disorder (HCC)    Cervical lymphadenitis 04/20/2021   CHF (congestive heart failure) (HCC)    Chronic bronchitis (HCC)    Chronic systolic heart failure (HCC)    Controlled substance agreement signed 10/22/2018   COPD (chronic obstructive pulmonary disease) (HCC)    COPD (chronic obstructive pulmonary disease) (HCC) 10/04/2015   Cough 12/31/2018   Crack cocaine use    Depression    Genital herpes    HIV (human immunodeficiency virus infection) (HCC) dx'd 08/1993   HLD (hyperlipidemia)    Hypertension    NICM (nonischemic cardiomyopathy) (HCC)    Echocardiogram 06/28/11: EF 30-35%, mild MR, mild LAE;  No CAD by coronary CT angiogram 3/12 at [facility]   NSVT (nonsustained ventricular tachycardia) (HCC)    PTSD (post-traumatic stress disorder)     Past Surgical History:  Procedure Laterality Date   CARDIAC DEFIBRILLATOR PLACEMENT  01/08/2018   ICD IMPLANT N/A 01/08/2018   Procedure: ICD IMPLANT;  Surgeon: Regan Lemming, MD;  Location: [facility] INVASIVE CV LAB;  Service: Cardiovascular;  Laterality: N/A;   RIGHT/LEFT HEART CATH AND CORONARY ANGIOGRAPHY N/A 01/03/2024   Procedure: RIGHT/LEFT HEART CATH AND CORONARY ANGIOGRAPHY;  Surgeon: Dolores Patty, MD;  Location: [facility] INVASIVE CV LAB;  Service: Cardiovascular;  Laterality: N/A;     Home Medications:  Prior to Admission medications   Medication Sig Start Date End Date Taking? Authorizing Provider  albuterol (VENTOLIN HFA) 108 (90 Base) MCG/ACT inhaler Inhale 2 puffs into the lungs every 6 (six) hours as needed for wheezing or shortness of breath. 07/30/23  Yes Storm Frisk, MD  amiodarone (PACERONE) 200 MG tablet Take 1 tablet (200 mg total) by mouth 2 (two) times daily. 02/08/24  Yes  Jonita Albee, PA-C  aspirin EC 81 MG tablet Take 81 mg by mouth in the morning. Swallow whole.   Yes [provider]  bictegravir-emtricitabine-tenofovir AF (BIKTARVY) 50-200-25 MG TABS tablet Take 1 tablet by mouth daily. Patient taking differently: Take 1 tablet by mouth in the morning. 08/15/23  Yes Daiva Eves, Lisette Grinder, MD  clonazepam (KLONOPIN) 0.125 MG disintegrating tablet Take 1 tablet (0.125 mg total) by mouth 3 (three) times daily. 02/08/24  Yes Jonita Albee, PA-C  dronabinol (MARINOL) 5 MG capsule Take 1 capsule (5 mg total) by mouth 2 (two) times daily before lunch and supper. 02/09/24  Yes Jonita Albee, PA-C  empagliflozin (JARDIANCE) 10 MG TABS tablet Take 1 tablet (10 mg total) by mouth daily before breakfast. 12/01/22  Yes Mil[place], Anderson Malta, FNP  eplerenone (INSPRA) 25 MG tablet Take 1 tablet (25 mg total) by mouth daily. 02/09/24  Yes Jonita Albee, PA-C  finasteride (PROSCAR) 5 MG tablet TAKE 1 TABLET(5 MG) BY MOUTH DAILY Patient taking differently: Take 5 mg by mouth in the morning. 03/15/23  Yes Storm Frisk, MD  fluticasone furoate-vilanterol (BREO ELLIPTA) 200-25 MCG/ACT AEPB Inhale 1 puff into the lungs daily.   Yes [provider]  IBUPROFEN PO Take 400 mg by mouth every 6 (six) hours as needed for mild pain (pain score 1-3), moderate pain (pain score 4-6) or fever. Liquid gel   Yes [provider]  LORazepam (ATIVAN) 0.5 MG tablet Take 1-2 tablets (0.5-1 mg total) by mouth every 4 (four) hours as needed for anxiety. 02/08/24  Yes Jonita Albee, PA-C  Melatonin 10 MG TABS Take 10 mg by mouth at bedtime.   Yes [provider]  mexiletine (MEXITIL) 250 MG capsule Take 1 capsule (250 mg total) by mouth every 12 (twelve) hours. 02/08/24  Yes Jonita Albee, PA-C  midodrine (PROAMATINE) 10 MG tablet Take 1 tablet (10 mg total) by mouth 3 (three) times daily with meals. 02/09/24  Yes Jonita Albee, PA-C   Multiple Vitamins-Minerals (MENS MULTIPLUS PO) Take 1 tablet by mouth daily.   Yes [provider]  polyethylene glycol (MIRALAX / GLYCOLAX) 17 g packet Take 17 g by mouth daily. 02/09/24  Yes Jonita Albee, PA-C  tamsulosin (FLOMAX) 0.4 MG CAPS capsule TAKE 2 CAPSULES(0.8 MG) BY MOUTH DAILY AFTER SUPPER Patient taking differently: Take 0.4 mg by mouth 2 (two) times daily. 07/26/23  Yes Storm Frisk, MD  triamcinolone ointment (KENALOG) 0.5 % Apply 1 Application topically 2 (two) times daily. Patient taking differently: Apply 1 Application topically daily. 08/15/23  Yes Storm Frisk, MD  valACYclovir (VALTREX) 500 MG tablet Take 1 tablet (500 mg total) by mouth daily. Patient taking differently: Take 500 mg by mouth in the morning. 11/15/22  Yes Storm Frisk, MD  amiodarone (PACERONE) 200 MG tablet Take 1 tablet (200 mg total) by mouth daily. 03/09/24   Robet Leu  R, PA-C    Inpatient Medications: Scheduled Meds:  amiodarone  200 mg Oral BID   aspirin EC  81 mg Oral Daily   [START ON 02/18/2024] bictegravir-emtricitabine-tenofovir AF  1 tablet Oral Daily   clonazepam  0.125 mg Oral TID   dronabinol  5 mg Oral BID AC   empagliflozin  10 mg Oral QAC breakfast   finasteride  5 mg Oral Daily   fluticasone furoate-vilanterol  1 puff Inhalation Daily   furosemide  40 mg Intravenous Daily   mexiletine  250 mg Oral Q12H   midodrine  10 mg Oral TID WC   mirtazapine  7.5 mg Oral QHS   polyethylene glycol  17 g Oral Daily   spironolactone  25 mg Oral Daily   tamsulosin  0.4 mg Oral BID   [START ON 02/18/2024] valACYclovir  500 mg Oral Daily   Continuous Infusions:  heparin 1,400 Units/hr (02/17/24 1047)   PRN Meds: acetaminophen **OR** acetaminophen, albuterol  Allergies:   No Known Allergies  Social History:   Social History   Socioeconomic History   Marital status: Single    Spouse name: Not on file   Number of children: Not on file   Years of  education: Not on file   Highest education level: GED or equivalent  Occupational History   Not on file  Tobacco Use   Smoking status: Every Day    Current packs/day: 0.75    Average packs/day: 0.8 packs/day for 42.0 years (31.5 ttl pk-yrs)    Types: Cigarettes   Smokeless tobacco: Never   Tobacco comments:    hoping welbutrin will help  Vaping Use   Vaping status: Never Used  Substance and Sexual Activity   Alcohol use: Not Currently    Alcohol/week: 6.0 standard drinks of alcohol    Comment: 01/08/2018 "stopped 06/2017"   Drug use: Not Currently    Types: "Crack" cocaine    Comment: 01/08/2018 "stopped 06/2017"   Sexual activity: Yes    Birth control/protection: Condom    Comment: pt. declined condoms  Other Topics Concern   Not on file  Social History Narrative   Not on file   Social Drivers of Health   Financial Resource Strain: Low Risk  (07/17/2023)   Overall Financial Resource Strain (CARDIA)    Difficulty of Paying Living Expenses: Not hard at all  Food Insecurity: No Food Insecurity (02/17/2024)   Hunger Vital Sign    Worried About Running Out of Food in the Last Year: Never true    Ran Out of Food in the Last Year: Never true  Transportation Needs: No Transportation Needs (02/17/2024)   PRAPARE - Administrator, Civil Service (Medical): No    Lack of Transportation (Non-Medical): No  Physical Activity: Insufficiently Active (07/17/2023)   Exercise Vital Sign    Days of Exercise per Week: 3 days    Minutes of Exercise per Session: 20 min  Stress: Stress Concern Present (07/17/2023)   [employer] of Occupational Health - Occupational Stress Questionnaire    Feeling of Stress : To some extent  Social Connections: Moderately Integrated (07/17/2023)   Social Connection and Isolation Panel [NHANES]    Frequency of Communication with Friends and Family: Twice a week    Frequency of Social Gatherings with Friends and Family: More than three times a week     Attends Religious Services: 1 to 4 times per year    Active Member of [employer] or Organizations: Yes  Attends Banker Meetings: More than 4 times per year    Marital Status: Never married  Intimate Partner Violence: Not At Risk (02/17/2024)   Humiliation, Afraid, Rape, and Kick questionnaire    Fear of Current or Ex-Partner: No    Emotionally Abused: No    Physically Abused: No    Sexually Abused: No    Family History:    Family History  Problem Relation Age of Onset   Glaucoma Mother    Mental illness Mother    Vision loss Mother    Hypertension Father    CAD Father    Mental illness Father    Alcohol abuse Father    Drug abuse Father    Heart attack Father 49   Early death Father    Heart disease Father    Alcohol abuse Brother    Drug abuse Brother    Drug abuse Brother      ROS:  Please see the history of present illness.   All other ROS reviewed and negative.     Physical Exam/Data:   Vitals:   02/17/24 0600 02/17/24 0615 02/17/24 0700 02/17/24 0841  BP: (!) 140/113 (!) 144/111 (!) 148/98 109/83  Pulse:  88  95  Resp: 19 (!) 26 12 20   Temp:   97.9 F (36.6 C) (!) 97.5 F (36.4 C)  TempSrc:   Oral Oral  SpO2:  98% 100% 100%  Weight:      Height:        Intake/Output Summary (Last 24 hours) at 02/17/2024 1050 Last data filed at 02/17/2024 1610 Gross per 24 hour  Intake --  Output 2525 ml  Net -2525 ml      02/16/2024    7:34 PM 02/16/2024    7:29 PM 02/08/2024    6:16 AM  Last 3 Weights  Weight (lbs) 191 lb 191 lb 191 lb 4.8 oz  Weight (kg) 86.637 kg 86.637 kg 86.773 kg     Body mass index is 29.04 kg/m.  General:  Well nourished, well developed, in no acute distress HEENT: normal Neck: no JVD at 90 degrees Vascular: No carotid bruits; Distal pulses 2+ bilaterally Cardiac:  normal S1, S2; RRR; no murmur  Lungs:  clear to auscultation bilaterally, no wheezing, rhonchi or rales  Abd: soft, nontender, no hepatomegaly  Ext: no  edema Musculoskeletal: Bilateral lower extremity edema Skin: warm and dry  Neuro:  CNs 2-12 intact, no focal abnormalities noted Psych: Depressed  EKG:  The EKG was personally reviewed and demonstrates: No new   Telemetry:  Telemetry was personally reviewed and demonstrates: Sinus rhythm  Relevant CV Studies: 01/03/2024 Ao =88/68 (78) LV = 86/26 RA = 10 RV = 45/13 PA = 40/28 (34) PCW = 24 Thermo CO/CI = 3.8/1.9 Fick CO/CI = 3.2/1.6 PVR = 3.1 (Fick) 2.7 (TD) Ao sat = 95% PA sat = 64%, 60% PAPi = 1.2   Assessment: 1. Normal coronaries 2. Severe NICM with severe biventricular dysfunction LVEF 25% 3. Elevated filling pressures with low cardiac output   10/09/2023 1. Left ventricular ejection fraction, by estimation, is 20 to 25%. The  left ventricle has severely decreased function. The left ventricle  demonstrates global hypokinesis. The left ventricular internal cavity size  was mildly to moderately dilated. Left  ventricular diastolic parameters are consistent with Grade I diastolic  dysfunction (impaired relaxation).   2. Right ventricular systolic function is normal. The right ventricular  size is normal. Tricuspid regurgitation signal is inadequate for  assessing  PA pressure.   3. The mitral valve is normal in structure. Mild mitral valve  regurgitation.   4. The aortic valve is normal in structure. Aortic valve regurgitation is  not visualized.   5. The inferior vena cava is normal in size with greater than 50%  respiratory variability, suggesting right atrial pressure of 3 mmHg.   Comparison(s): No significant change from prior study.   Laboratory Data:  High Sensitivity Troponin:   Recent Labs  Lab 01/21/24 0043 02/16/24 1930 02/17/24 0615  TROPONINIHS 14 15 13      Chemistry Recent Labs  Lab 02/16/24 1930 02/17/24 0615  NA 140 139  K 4.1 3.6  CL 108 109  CO2 22 19*  GLUCOSE 88 73  BUN 18 17  CREATININE 1.71* 1.49*  CALCIUM 8.6* 7.9*   GFRNONAA 45* 53*  ANIONGAP 10 11    Recent Labs  Lab 02/17/24 0615  PROT 5.6*  ALBUMIN 2.9*  AST 37  ALT 45*  ALKPHOS 65  BILITOT 2.2*   Lipids No results for input(s): "CHOL", "TRIG", "HDL", "LABVLDL", "LDLCALC", "CHOLHDL" in the last 168 hours.  Hematology Recent Labs  Lab 02/16/24 1930  WBC 2.7*  RBC 5.43  HGB 18.1*  HCT 54.3*  MCV 100.0  MCH 33.3  MCHC 33.3  RDW 15.4  PLT 195   Thyroid No results for input(s): "TSH", "FREET4" in the last 168 hours.  BNP Recent Labs  Lab 02/16/24 2300  BNP 1,959.4*    DDimer  Recent Labs  Lab 02/17/24 0615  DDIMER 8.27*     Radiology/Studies:  DG Chest 2 View Result Date: 02/16/2024 CLINICAL DATA:  Chest pain shortness of breath for several hours, initial encounter EXAM: CHEST - 2 VIEW COMPARISON:  01/23/2024 FINDINGS: Cardiac shadow is enlarged but stable. Defibrillator is again seen. Lungs are well aerated bilaterally. No bony abnormality is noted. IMPRESSION: No active cardiopulmonary disease. Electronically Signed   By: Alcide Clever M.D.   On: 02/16/2024 20:40     Assessment and Plan:   Low output chronic nonischemic cardiomyopathy Here with decompensated heart failure.  He is palliative.  -Can continue IV diuresis; increased to 40 mg IV twice daily.  Renal function is stable. -K>4, Mg>2, continue I/Os -Can continue Jardiance -Can continue spironolactone 25 mg daily   Recent VT storm -ICD tachy therapies have been turned off -Continue amiodarone and mexiletine to 50 mg twice daily  Risk Assessment/Risk Scores:  {Complete the following score calculators/questions to meet required metrics.  Press F2         :21  For questions or updates, please contact [facility] Please consult [email or web address] for contact info under    Signed, Maisie Fus, MD  02/17/2024 10:50 AM

## 2024-02-17 NOTE — Plan of Care (Signed)
   Problem: Nutrition: Goal: Adequate nutrition will be maintained Outcome: Progressing

## 2024-02-17 NOTE — ED Notes (Signed)
 Call to floor, pt is coming up

## 2024-02-17 NOTE — Progress Notes (Signed)
 PHARMACY - ANTICOAGULATION CONSULT NOTE  Pharmacy Consult for IV UFH Indication: elevated d-dimer, rule out DVT/PE  No Known Allergies  Patient Measurements: Height: 5\' 8"  (172.7 cm) Weight: 86.6 kg (191 lb) IBW/kg (Calculated) : 68.4 Heparin Dosing Weight: 86 kg  Vital Signs: Temp: 97.5 F (36.4 C) (03/02 0841) Temp Source: Oral (03/02 0841) BP: 109/83 (03/02 0841) Pulse Rate: 95 (03/02 0841)  Labs: Recent Labs    02/16/24 1930 02/17/24 0615  HGB 18.1*  --   HCT 54.3*  --   PLT 195  --   CREATININE 1.71* 1.49*  TROPONINIHS 15 13    Estimated Creatinine Clearance: 55 mL/min (A) (by C-G formula based on SCr of 1.49 mg/dL (H)).   Medical History: Past Medical History:  Diagnosis Date   Active smoker    AICD (automatic cardioverter/defibrillator) present    Alcohol abuse    Allergy July 2023   Anxiety    AR (allergic rhinitis)    Bipolar 1 disorder (HCC)    Cervical lymphadenitis 04/20/2021   CHF (congestive heart failure) (HCC)    Chronic bronchitis (HCC)    Chronic systolic heart failure (HCC)    Controlled substance agreement signed 10/22/2018   COPD (chronic obstructive pulmonary disease) (HCC)    COPD (chronic obstructive pulmonary disease) (HCC) 10/04/2015   Cough 12/31/2018   Crack cocaine use    Depression    Genital herpes    HIV (human immunodeficiency virus infection) (HCC) dx'd 08/1993   HLD (hyperlipidemia)    Hypertension    NICM (nonischemic cardiomyopathy) (HCC)    Echocardiogram 06/28/11: EF 30-35%, mild MR, mild LAE;  No CAD by coronary CT angiogram 3/12 at [facility]   NSVT (nonsustained ventricular tachycardia) (HCC)    PTSD (post-traumatic stress disorder)     Medications:  Scheduled:   amiodarone  200 mg Oral BID   aspirin EC  81 mg Oral Daily   [START ON 02/18/2024] bictegravir-emtricitabine-tenofovir AF  1 tablet Oral Daily   clonazepam  0.125 mg Oral TID   dronabinol  5 mg Oral BID AC   empagliflozin  10 mg  Oral QAC breakfast   finasteride  5 mg Oral Daily   fluticasone furoate-vilanterol  1 puff Inhalation Daily   furosemide  40 mg Intravenous Daily   mexiletine  250 mg Oral Q12H   midodrine  10 mg Oral TID WC   polyethylene glycol  17 g Oral Daily   potassium chloride  40 mEq Oral Once   spironolactone  25 mg Oral Daily   tamsulosin  0.4 mg Oral BID   [START ON 02/18/2024] valACYclovir  500 mg Oral Daily   Infusions:   Assessment: 63 yo admitted with recurrent worsening HF now with elevated d-dimer to empirically start IV heparin until can rule out or in DVT/PE. Baseline labs CBC and SCr already done.   Goal of Therapy:  Heparin level 0.3-0.7 units/ml Monitor platelets by anticoagulation protocol: Yes   Plan:  IV heparin 2500 unit bolus then IV heparin rate of 1400 units/hr Check heparin level in 8 hours Daily CBC and heparin level   Berkley Harvey 02/17/2024,8:59 AM

## 2024-02-17 NOTE — Care Management Obs Status (Signed)
 MEDICARE OBSERVATION STATUS NOTIFICATION   Patient Details  Name: Ryan Long MRN: [id number] Date of Birth: March 02, 1961   Medicare Observation Status Notification Given:  Yes    Howell Rucks, RN 02/17/2024, 1:53 PM

## 2024-02-17 NOTE — H&P (Signed)
 History and Physical    Ryan Long NGE:[id number] DOB: [birth year] DOA: 02/16/2024  PCP: Cristino Martes, NP  Patient coming from: Home  Chief Complaint: Chest pain  HPI: Ryan Long is a 63 y.o. male with medical history significant of chronic HFrEF/end-stage cardiomyopathy status post AICD, NSVT, HIV, hypertension, hyperlipidemia, COPD, anxiety, depression, bipolar disorder, substance abuse, BPH, CKD stage IIIa.  Patient was recently admitted by cardiology service 2/5-2/21 for worsening heart failure symptoms and VT storm.  Palliative care was consulted and patient was made DNR/DNI.  Patient had decided to pursue hospice and his ICD was inactivated.  He was discharged to group home with hospice.  Patient presents to the ED complaining of chest pain and shortness of breath.  Vital signs on arrival: Temperature 97.8 F, pulse 115, respiratory rate 21, blood pressure 123/94, and SpO2 100% on room air.  Labs showing WBC count 2.7 (was 3.4 on labs 3 weeks ago), hemoglobin 18.1 (previously elevated as well and stable), creatinine 1.7 (was 1.4 on labs 2 weeks ago), BNP 1959, troponin negative, lactic acid normal, blood cultures collected, UDS pending.  Chest x-ray showing no active cardiopulmonary disease. ED physician discussed the case with on-call cardiologist Dr. Orson Aloe who recommended admitting the patient to [facility] by hospitalist service and requested that cardiology team is consulted during daytime.  Patient was given IV Lasix 40 mg.  TRH called to admit.  Patient states since he left the hospital last time he has continued to have shortness of breath, heart palpitations, lightheadedness, and substernal chest pain.  Symptoms have been worse for the past few days prompting him to come into the ED to be evaluated.  He is taking his home medications as prescribed.  Patient states he was previously advised to go to a skilled nursing facility and had declined but now regrets his  decision.  He is tearful and concerned that his advanced heart failure is affecting the quality of his life.  Patient mentions having thoughts of walking to the street and jumping in front of a car to end his life and states he has not been able to do so as he is barely able to walk due to shortness of breath.  Review of Systems:  Review of Systems  All other systems reviewed and are negative.   Past Medical History:  Diagnosis Date   Active smoker    AICD (automatic cardioverter/defibrillator) present    Alcohol abuse    Allergy July 2023   Anxiety    AR (allergic rhinitis)    Bipolar 1 disorder (HCC)    Cervical lymphadenitis 04/20/2021   CHF (congestive heart failure) (HCC)    Chronic bronchitis (HCC)    Chronic systolic heart failure (HCC)    Controlled substance agreement signed 10/22/2018   COPD (chronic obstructive pulmonary disease) (HCC)    Cough 12/31/2018   Crack cocaine use    Depression    Genital herpes    HIV (human immunodeficiency virus infection) (HCC) dx'd 08/1993   HLD (hyperlipidemia)    Hypertension    NICM (nonischemic cardiomyopathy) (HCC)    Echocardiogram 06/28/11: EF 30-35%, mild MR, mild LAE;  No CAD by coronary CT angiogram 3/12 at [facility]   NSVT (nonsustained ventricular tachycardia) (HCC)    PTSD (post-traumatic stress disorder)     Past Surgical History:  Procedure Laterality Date   CARDIAC DEFIBRILLATOR PLACEMENT  01/08/2018   ICD IMPLANT N/A 01/08/2018   Procedure: ICD IMPLANT;  Surgeon: Elberta Fortis, Will  Daphine Deutscher, MD;  Location: [facility] INVASIVE CV LAB;  Service: Cardiovascular;  Laterality: N/A;   RIGHT/LEFT HEART CATH AND CORONARY ANGIOGRAPHY N/A 01/03/2024   Procedure: RIGHT/LEFT HEART CATH AND CORONARY ANGIOGRAPHY;  Surgeon: Dolores Patty, MD;  Location: [facility] INVASIVE CV LAB;  Service: Cardiovascular;  Laterality: N/A;     reports that he has been smoking cigarettes. He has a 31.5 pack-year smoking history. He has never used  smokeless tobacco. He reports that he does not currently use alcohol after a past usage of about 6.0 standard drinks of alcohol per week. He reports that he does not currently use drugs after having used the following drugs: "Crack" cocaine.  No Known Allergies  Family History  Problem Relation Age of Onset   Glaucoma Mother    Mental illness Mother    Vision loss Mother    Hypertension Father    CAD Father    Mental illness Father    Alcohol abuse Father    Drug abuse Father    Heart attack Father 67   Early death Father    Heart disease Father    Alcohol abuse Brother    Drug abuse Brother    Drug abuse Brother     Prior to Admission medications   Medication Sig Start Date End Date Taking? Authorizing Provider  albuterol (VENTOLIN HFA) 108 (90 Base) MCG/ACT inhaler Inhale 2 puffs into the lungs every 6 (six) hours as needed for wheezing or shortness of breath. 07/30/23  Yes Storm Frisk, MD  amiodarone (PACERONE) 200 MG tablet Take 1 tablet (200 mg total) by mouth 2 (two) times daily. 02/08/24  Yes Jonita Albee, PA-C  aspirin EC 81 MG tablet Take 81 mg by mouth in the morning. Swallow whole.   Yes [provider]  bictegravir-emtricitabine-tenofovir AF (BIKTARVY) 50-200-25 MG TABS tablet Take 1 tablet by mouth daily. Patient taking differently: Take 1 tablet by mouth in the morning. 08/15/23  Yes Daiva Eves, Lisette Grinder, MD  clonazepam (KLONOPIN) 0.125 MG disintegrating tablet Take 1 tablet (0.125 mg total) by mouth 3 (three) times daily. 02/08/24  Yes Jonita Albee, PA-C  dronabinol (MARINOL) 5 MG capsule Take 1 capsule (5 mg total) by mouth 2 (two) times daily before lunch and supper. 02/09/24  Yes Jonita Albee, PA-C  empagliflozin (JARDIANCE) 10 MG TABS tablet Take 1 tablet (10 mg total) by mouth daily before breakfast. 12/01/22  Yes [place], Anderson Malta, FNP  eplerenone (INSPRA) 25 MG tablet Take 1 tablet (25 mg total) by mouth daily. 02/09/24  Yes  Jonita Albee, PA-C  finasteride (PROSCAR) 5 MG tablet TAKE 1 TABLET(5 MG) BY MOUTH DAILY Patient taking differently: Take 5 mg by mouth in the morning. 03/15/23  Yes Storm Frisk, MD  fluticasone furoate-vilanterol (BREO ELLIPTA) 200-25 MCG/ACT AEPB Inhale 1 puff into the lungs daily.   Yes [provider]  IBUPROFEN PO Take 400 mg by mouth every 6 (six) hours as needed for mild pain (pain score 1-3), moderate pain (pain score 4-6) or fever. Liquid gel   Yes [provider]  LORazepam (ATIVAN) 0.5 MG tablet Take 1-2 tablets (0.5-1 mg total) by mouth every 4 (four) hours as needed for anxiety. 02/08/24  Yes Jonita Albee, PA-C  Melatonin 10 MG TABS Take 10 mg by mouth at bedtime.   Yes [provider]  mexiletine (MEXITIL) 250 MG capsule Take 1 capsule (250 mg total) by mouth every 12 (twelve) hours. 02/08/24  Yes Laural Benes,  Leeroy Bock, PA-C  midodrine (PROAMATINE) 10 MG tablet Take 1 tablet (10 mg total) by mouth 3 (three) times daily with meals. 02/09/24  Yes Jonita Albee, PA-C  Multiple Vitamins-Minerals (MENS MULTIPLUS PO) Take 1 tablet by mouth daily.   Yes [provider]  polyethylene glycol (MIRALAX / GLYCOLAX) 17 g packet Take 17 g by mouth daily. 02/09/24  Yes Jonita Albee, PA-C  tamsulosin (FLOMAX) 0.4 MG CAPS capsule TAKE 2 CAPSULES(0.8 MG) BY MOUTH DAILY AFTER SUPPER Patient taking differently: Take 0.4 mg by mouth 2 (two) times daily. 07/26/23  Yes Storm Frisk, MD  triamcinolone ointment (KENALOG) 0.5 % Apply 1 Application topically 2 (two) times daily. Patient taking differently: Apply 1 Application topically daily. 08/15/23  Yes Storm Frisk, MD  valACYclovir (VALTREX) 500 MG tablet Take 1 tablet (500 mg total) by mouth daily. Patient taking differently: Take 500 mg by mouth in the morning. 11/15/22  Yes Storm Frisk, MD  amiodarone (PACERONE) 200 MG tablet Take 1 tablet (200 mg total) by mouth daily. 03/09/24    Jonita Albee, PA-C    Physical Exam: Vitals:   02/16/24 2000 02/16/24 2310 02/16/24 2324 02/17/24 0100  BP: 108/84 111/82  115/88  Pulse: 100 97  (!) 101  Resp: 15 17 19 19   Temp:   (!) 97.4 F (36.3 C)   TempSrc:   Oral   SpO2: 100% 100%  100%  Weight:      Height:        Physical Exam Vitals reviewed.  Constitutional:      General: He is not in acute distress. HENT:     Head: Normocephalic and atraumatic.  Eyes:     Extraocular Movements: Extraocular movements intact.  Cardiovascular:     Rate and Rhythm: Normal rate and regular rhythm.     Pulses: Normal pulses.  Pulmonary:     Effort: Pulmonary effort is normal. No respiratory distress.     Breath sounds: Normal breath sounds. No wheezing or rales.  Abdominal:     General: Bowel sounds are normal. There is no distension.     Palpations: Abdomen is soft.     Tenderness: There is no abdominal tenderness. There is no guarding.  Musculoskeletal:     Cervical back: Normal range of motion.     Right lower leg: Edema present.     Left lower leg: Edema present.     Comments: 2+ pitting edema of bilateral lower legs  Skin:    General: Skin is warm and dry.  Neurological:     General: No focal deficit present.     Mental Status: He is alert and oriented to person, place, and time.     Labs on Admission: I have personally reviewed following labs and imaging studies  CBC: Recent Labs  Lab 02/16/24 1930  WBC 2.7*  HGB 18.1*  HCT 54.3*  MCV 100.0  PLT 195   Basic Metabolic Panel: Recent Labs  Lab 02/16/24 1930  NA 140  K 4.1  CL 108  CO2 22  GLUCOSE 88  BUN 18  CREATININE 1.71*  CALCIUM 8.6*   GFR: Estimated Creatinine Clearance: 48 mL/min (A) (by C-G formula based on SCr of 1.71 mg/dL (H)). Liver Function Tests: No results for input(s): "AST", "ALT", "ALKPHOS", "BILITOT", "PROT", "ALBUMIN" in the last 168 hours. No results for input(s): "LIPASE", "AMYLASE" in the last 168 hours. No results  for input(s): "AMMONIA" in the last 168 hours. Coagulation Profile:  No results for input(s): "INR", "PROTIME" in the last 168 hours. Cardiac Enzymes: No results for input(s): "CKTOTAL", "CKMB", "CKMBINDEX", "TROPONINI" in the last 168 hours. BNP (last 3 results) No results for input(s): "PROBNP" in the last 8760 hours. HbA1C: No results for input(s): "HGBA1C" in the last 72 hours. CBG: No results for input(s): "GLUCAP" in the last 168 hours. Lipid Profile: No results for input(s): "CHOL", "HDL", "LDLCALC", "TRIG", "CHOLHDL", "LDLDIRECT" in the last 72 hours. Thyroid Function Tests: No results for input(s): "TSH", "T4TOTAL", "FREET4", "T3FREE", "THYROIDAB" in the last 72 hours. Anemia Panel: No results for input(s): "VITAMINB12", "FOLATE", "FERRITIN", "TIBC", "IRON", "RETICCTPCT" in the last 72 hours. Urine analysis:    Component Value Date/Time   COLORURINE YELLOW 02/02/2024 1204   APPEARANCEUR CLEAR 02/02/2024 1204   LABSPEC 1.016 02/02/2024 1204   PHURINE 6.0 02/02/2024 1204   GLUCOSEU >=500 (A) 02/02/2024 1204   HGBUR NEGATIVE 02/02/2024 1204   BILIRUBINUR NEGATIVE 02/02/2024 1204   BILIRUBINUR Negative 06/15/2016 1542   KETONESUR NEGATIVE 02/02/2024 1204   PROTEINUR NEGATIVE 02/02/2024 1204   UROBILINOGEN 0.2 08/01/2021 0834   NITRITE NEGATIVE 02/02/2024 1204   LEUKOCYTESUR NEGATIVE 02/02/2024 1204    Radiological Exams on Admission: DG Chest 2 View Result Date: 02/16/2024 CLINICAL DATA:  Chest pain shortness of breath for several hours, initial encounter EXAM: CHEST - 2 VIEW COMPARISON:  01/23/2024 FINDINGS: Cardiac shadow is enlarged but stable. Defibrillator is again seen. Lungs are well aerated bilaterally. No bony abnormality is noted. IMPRESSION: No active cardiopulmonary disease. Electronically Signed   By: Alcide Clever M.D.   On: 02/16/2024 20:40    EKG: Independently reviewed.  Sinus versus ectopic atrial tachycardia, baseline wander.  Assessment and  Plan  Acute on chronic HFrEF/end-stage nonischemic cardiomyopathy Last echo done in October 2024 showing EF 20 to 25%.  Patient was recently admitted 2/5-2/21 for worsening heart failure symptoms and VT storm.  Palliative care was consulted and patient had decided to pursue hospice so his ICD was inactivated.  He is now presenting with worsening dyspnea on exertion and lower extremity edema. BNP elevated at 1959.  Chest x-ray not suggestive of pulmonary edema.  ED physician discussed the case with on-call cardiologist who felt that the patient could stay here at [facility] and requested that hospitalist team consult cardiology during daytime.  Patient was given IV Lasix 40 mg in the ED. Continue eplerenone and Jardiance.  Continue midodrine for blood pressure support.  Monitor intake and output, daily weights.  Low-sodium diet with fluid restriction.  Palliative care consulted for end-of-life goals of care conversation and help with hospice placement.  Chest pain Troponin x 1 negative and EKG without acute ischemic changes.  Recent cath done in January showing normal coronaries. ?PE but patient is not hypoxic, satting 100% on room air.  Avoiding CT with contrast given CKD.  Check D-dimer level and trend troponin.  UDS pending.  AKI on CKD stage IIIa Likely cardiorenal in etiology.  Creatinine was 1.4 on labs 2 weeks ago and now increased to 1.7.  Patient was given IV Lasix for diuresis.  Continue to monitor renal function.  Suicidal ideation History of anxiety, depression, bipolar disorder Patient is tearful and concerned that his advanced heart failure is affecting the quality of his life.  He mentions having thoughts of walking to the street and jumping in front of a car to end his life and states he has not been able to do so as he is barely able to walk  due to shortness of breath.  He also repeatedly told RN that he is having thoughts of jumping in front of a car to end his life.  Placed on suicide  precautions and psychiatry consulted.  Also palliative care consulted for goals of care discussion as mentioned previously.  Continue home Klonopin for chronic anxiety.  History of VT Continue amiodarone and mexiletine.  HIV He is on Biktarvy.  Also on chronic Valtrex due to history of recurrent HSV.  COPD Stable, no wheezing.  Continue home inhalers.  BPH Continue home medications.  DVT prophylaxis: Lovenox Code Status: Patient was seen by palliative care during his recent admission and made DNR/DNI given end-stage heart failure.  Discussed CODE STATUS with the patient and he wishes to remain DNR/DNI. Family Communication: No family available at this time. Level of care: Progressive Care Unit Admission status: It is my clinical opinion that referral for OBSERVATION is reasonable and necessary in this patient based on the above information provided. The aforementioned taken together are felt to place the patient at high risk for further clinical deterioration. However, it is anticipated that the patient may be medically stable for discharge from the hospital within 24 to 48 hours.  [name] Giovanni MD [facility]  If 7PM-7AM, please contact night-coverage [email or web address]  02/17/2024, 2:33 AM

## 2024-02-17 NOTE — Consult Note (Signed)
 Palliative Medicine Inpatient Consult Note  Consulting Provider:  Derwood Kaplan, MD   Reason for consult:   Palliative Care Consult Services Palliative Medicine Consult  Reason for Consult? Needs end-of-life goals of care conversation, help with hospice placement   02/17/2024  HPI:  Per intake H&P --> Ryan Long is a 63 y.o. male with medical history significant of chronic HFrEF/end-stage cardiomyopathy status post AICD, NSVT, HIV, hypertension, hyperlipidemia, COPD, anxiety, depression, bipolar disorder, substance abuse, BPH, CKD stage IIIa.    Patient was discharged from [facility] and readmitted with heart failure exacerbation. The PMT has been asked to support additional goals of care conversations.   Clinical Assessment/Goals of Care:  *Please note that this is a verbal dictation therefore any spelling or grammatical errors are due to the "Dragon Medical One" system interpretation.  I have reviewed medical records including EPIC notes, labs and imaging, received report from bedside RN, assessed the patient who is sitting at the side of the bed in no acute distress.    I met with Ryan Long to further discuss diagnosis prognosis, GOC, EOL wishes, disposition and options.   I introduced Palliative Medicine as specialized medical care for people living with serious illness. It focuses on providing relief from the symptoms and stress of a serious illness. The goal is to improve quality of life for both the patient and the family.  Medical History Review and Understanding:  A review of Ryan Long's past medical history inclusive of CKD III, hypertension, depression, heart failure, HIV, and polysubstance abuse was held.   Social History:  Ryan Long shares that he is originally from [place], [place].  He moved to [place] in 1995 shortly after his HIV diagnosis as he was expelled from his family.  He shares he has never been married or had children.  He states he has a brother in  [place] though he struggles with substance abuse therefore they have lost touch.  Ryan Long shares his father has died and his mother and he are not in contact. 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  He is a man of the [employer].   Functional and Nutritional State:  Preceding hospitalization Ryan Long was living at [facility] recovery center. He shares he rents a room there for $800 a month. He was able to complete all BADLs and IADLs on his own. He has a scooter which he utilizes to get around. He does state in the setting of his disease that his shortness of breath has become more profound thus causing him to utilize his scooter more often. He has used is scooter since October of this year and notes that he only put "200 miles" on it.   Advance Directives:  A detailed discussion was had today regarding advanced directives.  Patient shares that he can make decisions for himself. He sadly do not feel he has anyone in his life who can help with decision making.   Code Status:  Concepts specific to code status, artifical feeding and hydration, continued IV antibiotics and rehospitalization was had.  The difference between a aggressive medical intervention path  and a palliative comfort care path for this patient at this time was had.   Ryan Long is an established DNAR/DNI code status.  MOST reviewed as below:  Cardiopulmonary Resuscitation: Do Not Attempt Resuscitation (DNR/No CPR)  Medical Interventions: Limited Additional Interventions: Use medical treatment, IV fluids and cardiac monitoring as indicated, DO NOT USE intubation or mechanical ventilation. May consider use of  less invasive airway support such as BiPAP or CPAP. Also provide comfort measures. Transfer to the hospital if indicated. Avoid intensive care.   Antibiotics: Determine use of limitation of antibiotics when infection occurs  IV Fluids: IV fluids for a defined trial period  Feeding Tube: Feeding tube for a  defined trial period   Discussion:  Ryan Long and I discussed his prior realization and his active end-stage heart failure.  We reviewed that he at this point remains to have end-stage disease.  I asked Ryan Long what happened and why he did not end up going to skilled nursing on his prior admission.  Ryan Long shares with me that to "friends" decided to see him and shared with him to not go to skilled nursing as they would "take all his money".  He then determined he would go back to the sober living home though while there he shares when the [facility] nurse came to evaluate him he had intrusive thoughts of getting in front of a vehicle to end his life "quickly".  I asked Ryan Long if those thoughts are still present and he notes that they are.  I asked Ryan Long if he has any plans to harm anyone else and he shares he has no desire to hurt anyone else only himself.  I allowed time in reflection for Ryan Long to review why he is feeling this way.  He discusses that a death with his end-stage disease seems more painful than a quick traumatic death.  I reviewed with Ryan Long that there is always a better way to die then taking her own life.  I shared that the reason hospice is there is to support patients like him throughout the end stages of their time on earth.  I asked Ryan Long if he would want [place] to pursue comfort care and for him to go to a hospice home though he shares he does not feel ready for that at this time.  He notes he made a "big mistake".  When he determined he should go back to the sober living home and at this point in time would like skilled nursing placement.  He does seem aware that his disease is in the final stages and his time on earth will be limited.  I shared the plan for psychiatry to see Ryan Long to hopefully help him from the perspective of his intrusive thoughts.  We will continue to allow time for outcomes and work on various mental and clinical wellness.  Discussed the importance of continued conversation  with family and their  medical providers regarding overall plan of care and treatment options, ensuring decisions are within the context of the patients values and GOCs.  Decision Maker: Ryan Long is his own decision maker he cannot identify any when he would trust enough to make decisions on his behalf.   SUMMARY OF RECOMMENDATIONS   DNAR/DNI  Patient is aware of the progressive nature of his disease and that he is at end-stage with limited long-term prognosis.   Ryan Long is now agreeable to placement with OP Palliative and eventual hospice support  Appreciate psychiatry helping Jash navigate his SI   Ongoing palliative care support  Code Status/Advance Care Planning: DNAR/DNI   Palliative Prophylaxis:  Aspiration, Bowel Regimen, Delirium Protocol, Frequent Pain Assessment, Oral Care, Palliative Wound Care, and Turn Reposition  Additional Recommendations (Limitations, Scope, Preferences): Continue current care  Psycho-social/Spiritual:  Desire for further Chaplaincy support: Yes Additional Recommendations: Education on HF progression   Prognosis: Limited overall - hospice would be a  reasonable consideration.   Discharge Planning: Discharge plan to be determined  Vitals:   02/17/24 0600 02/17/24 0615  BP: (!) 140/113 (!) 144/111  Pulse:  88  Resp: 19 (!) 26  Temp:    SpO2:  98%    Intake/Output Summary (Last 24 hours) at 02/17/2024 0710 Last data filed at 02/17/2024 4098 Gross per 24 hour  Intake --  Output 2525 ml  Net -2525 ml   Last Weight  Most recent update: 02/16/2024  7:34 PM    Weight  86.6 kg (191 lb)            Gen: Older African-American male in no acute distress HEENT: moist mucous membranes CV: Regular rate and rhythm PULM: On RA,  breathing is even and nonlabored ABD: soft/nontender EXT: (+) BLE edema Neuro: Alert and oriented x3  PPS: 40-50%   This conversation/these recommendations were discussed with patient primary care team, Dr.  [place]  Billing based on MDM: High Time: 15 ______________________________________________________ [name] [place] Palliative Medicine Team Team Cell Phone: [phone] Please utilize secure chat with additional questions, if there is no response within 30 minutes please call the above phone number  Palliative Medicine Team providers are available by phone from 7am to 7pm daily and can be reached through the team cell phone.  Should this patient require assistance outside of these hours, please call the patient's attending physician.

## 2024-02-17 NOTE — TOC Initial Note (Signed)
 Transition of Care [facility]) - Initial/Assessment Note    Patient Details  Name: Ryan Long MRN: [id number] Date of Birth: 1961/01/22  Transition of Care [facility]) CM/SW Contact:    Howell Rucks, RN Phone Number: 02/17/2024, 2:06 PM  Clinical Narrative:  Met with pt at bedside to introduce role of TOC/NCM and review for dc planning, pt reports he lives in a Recovery [employer]. Per Teams chat, plan is for dc to a short term rehab/SNF with transition to LTC, pt voiced he is agreeable with the dc plan. MOON completed.  TOC will continue to follow.                  Expected Discharge Plan: Skilled Nursing Facility Barriers to Discharge: Continued Medical Work up   Patient Goals and CMS Choice Patient states their goals for this hospitalization and ongoing recovery are:: short term rehab/SNF          Expected Discharge Plan and Services       Living arrangements for the past 2 months: Boarding House ([employer])                                      Prior Living Arrangements/Services Living arrangements for the past 2 months: Boarding [employer]gallery exhibitions [employer]) Lives with:: Roommate Patient language and need for interpreter reviewed:: Yes        Need for Family Participation in Patient Care: Yes (Comment) Care giver support system in place?: Yes (comment)   Criminal Activity/Legal Involvement Pertinent to Current Situation/Hospitalization: No - Comment as needed  Activities of Daily Living   ADL Screening (condition at time of admission) Independently performs ADLs?: No Does the patient have a NEW difficulty with bathing/dressing/toileting/self-feeding that is expected to last >3 days?: Yes (Initiates electronic notice to provider for possible OT consult) Does the patient have a NEW difficulty with getting in/out of bed, walking, or climbing stairs that is expected to last >3 days?: Yes (Initiates electronic notice to provider for possible PT consult) Does  the patient have a NEW difficulty with communication that is expected to last >3 days?: No Is the patient deaf or have difficulty hearing?: No Does the patient have difficulty seeing, even when wearing glasses/contacts?: No Does the patient have difficulty concentrating, remembering, or making decisions?: No  Permission Sought/Granted                  Emotional Assessment Appearance:: Appears stated age Attitude/Demeanor/Rapport: Gracious Affect (typically observed): Accepting Orientation: : Oriented to Self, Oriented to Place, Oriented to  Time, Oriented to Situation Alcohol / Substance Use: Not Applicable Psych Involvement: No (comment)  Admission diagnosis:  Acute on chronic HFrEF (heart failure with reduced ejection fraction) (HCC) [I50.23] Acute congestive heart failure, unspecified heart failure type [facility]) [I50.9] Patient Active Problem List   Diagnosis Date Noted   Acute on chronic HFrEF (heart failure with reduced ejection fraction) (HCC) 02/17/2024   Chest pain 02/17/2024   AKI (acute kidney injury) (HCC) 02/17/2024   Ventricular tachycardia (HCC) 01/23/2024   Acute on chronic systolic CHF (congestive heart failure) (HCC) 01/03/2024   Asthmatic bronchitis 01/24/2023   Rash and nonspecific skin eruption 12/19/2022   Arm numbness 11/15/2022   Aortic atherosclerosis (HCC) 11/15/2022   Chronic kidney disease 08/08/2022   Healthcare maintenance 08/08/2022   Cigarette smoker 01/26/2022   ETD (Eustachian tube dysfunction), right 01/26/2022   Allergy to  pollen 01/26/2022   Polycythemia 12/08/2021   Vitamin D deficiency 12/08/2021   Hip pain 01/06/2021   VT (ventricular tachycardia) (HCC) 08/25/2020   HIV positive (HCC) 07/12/2020   Atrial fibrillation (HCC) 06/10/2020   BPH (benign prostatic hyperplasia) 01/28/2020   HFrEF (heart failure with reduced ejection fraction) (HCC) 01/28/2020   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   Achilles tendinitis, left leg 04/03/2019    AICD (automatic cardioverter/defibrillator) present 08/15/2018   Abnormal EKG 06/28/2018   Adjustment disorder with mixed disturbance of emotions and conduct    NICM (nonischemic cardiomyopathy) (HCC) 01/08/2018   Anxiety 12/20/2017   Bipolar 1 disorder (HCC) 10/10/2017   Chronic systolic heart failure (HCC) 05/13/2017   HSV-2 infection 07/17/2016   History of attempted suicide 04/14/2016   History of alcoholism (HCC) 04/14/2016   History of substance abuse (HCC) 04/14/2016   Constipation 01/17/2016   COPD (chronic obstructive pulmonary disease) (HCC) 10/04/2015   Abnormal thyroid stimulating hormone (TSH) level 10/04/2015   MDD (major depressive disorder), recurrent episode, severe (HCC) 09/23/2015   Hypertension 09/10/2015   PTSD (post-traumatic stress disorder) 08/07/2015   Primary insomnia 04/02/2015   Herpesviral infection of penis 04/02/2015   Depression with suicidal ideation 07/03/2014   Generalized anxiety disorder 05/29/2014   Tobacco use disorder 02/17/2013   Heart failure (HCC) 09/19/2011   Other primary cardiomyopathies 08/24/2011   HIV (human immunodeficiency virus infection) (HCC) 02/01/2009   Recurrent HSV (herpes simplex virus) 02/01/2009   Hyperlipidemia 02/01/2009   Allergic rhinitis 02/01/2009   PCP:  Cristino Martes, NP Pharmacy:   [facility] [phone] [place], [place] - [address] AT [facility] OF [address] [place]  [place] [address] Phone: [phone] Fax: [phone]  [id number] [place], [place] - [address] AT [facility] OF [address] & [address] [facility] [address] Phone: [phone] Fax: [phone]     Social Drivers of Health (SDOH) Social History: SDOH Screenings   Food Insecurity: No Food Insecurity (02/17/2024)  Housing: Low Risk  (02/17/2024)  Transportation Needs: No Transportation Needs (02/17/2024)  Utilities: Not At Risk (02/17/2024)  Alcohol Screen: Low Risk   (07/17/2023)  Depression (PHQ2-9): Low Risk  (08/01/2023)  Financial Resource Strain: Low Risk  (07/17/2023)  Physical Activity: Insufficiently Active (07/17/2023)  Social Connections: Moderately Integrated (07/17/2023)  Stress: Stress Concern Present (07/17/2023)  Tobacco Use: High Risk (02/16/2024)  Health Literacy: Adequate Health Literacy (07/18/2023)   SDOH Interventions:     Readmission Risk Interventions     No data to display
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
## 2024-02-18 ENCOUNTER — Observation Stay (HOSPITAL_COMMUNITY)

## 2024-02-18 ENCOUNTER — Telehealth: Payer: Self-pay | Admitting: Pharmacy Technician

## 2024-02-18 ENCOUNTER — Other Ambulatory Visit: Payer: 59

## 2024-02-18 ENCOUNTER — Encounter (HOSPITAL_COMMUNITY): Payer: 59 | Admitting: Internal Medicine

## 2024-02-18 ENCOUNTER — Other Ambulatory Visit (HOSPITAL_COMMUNITY): Payer: Self-pay

## 2024-02-18 DIAGNOSIS — Z5900 Homelessness unspecified: Secondary | ICD-10-CM | POA: Diagnosis not present

## 2024-02-18 DIAGNOSIS — J811 Chronic pulmonary edema: Secondary | ICD-10-CM | POA: Diagnosis not present

## 2024-02-18 DIAGNOSIS — I472 Ventricular tachycardia, unspecified: Secondary | ICD-10-CM | POA: Diagnosis present

## 2024-02-18 DIAGNOSIS — N400 Enlarged prostate without lower urinary tract symptoms: Secondary | ICD-10-CM | POA: Diagnosis present

## 2024-02-18 DIAGNOSIS — Z79899 Other long term (current) drug therapy: Secondary | ICD-10-CM | POA: Diagnosis not present

## 2024-02-18 DIAGNOSIS — Z515 Encounter for palliative care: Secondary | ICD-10-CM | POA: Diagnosis not present

## 2024-02-18 DIAGNOSIS — I5084 End stage heart failure: Secondary | ICD-10-CM | POA: Diagnosis present

## 2024-02-18 DIAGNOSIS — I9589 Other hypotension: Secondary | ICD-10-CM | POA: Diagnosis present

## 2024-02-18 DIAGNOSIS — E876 Hypokalemia: Secondary | ICD-10-CM | POA: Diagnosis present

## 2024-02-18 DIAGNOSIS — Z8249 Family history of ischemic heart disease and other diseases of the circulatory system: Secondary | ICD-10-CM | POA: Diagnosis not present

## 2024-02-18 DIAGNOSIS — F32A Depression, unspecified: Secondary | ICD-10-CM | POA: Diagnosis not present

## 2024-02-18 DIAGNOSIS — Z21 Asymptomatic human immunodeficiency virus [HIV] infection status: Secondary | ICD-10-CM | POA: Diagnosis present

## 2024-02-18 DIAGNOSIS — Z7984 Long term (current) use of oral hypoglycemic drugs: Secondary | ICD-10-CM | POA: Diagnosis not present

## 2024-02-18 DIAGNOSIS — I5023 Acute on chronic systolic (congestive) heart failure: Secondary | ICD-10-CM | POA: Diagnosis present

## 2024-02-18 DIAGNOSIS — R079 Chest pain, unspecified: Secondary | ICD-10-CM | POA: Diagnosis not present

## 2024-02-18 DIAGNOSIS — E785 Hyperlipidemia, unspecified: Secondary | ICD-10-CM | POA: Diagnosis present

## 2024-02-18 DIAGNOSIS — R0602 Shortness of breath: Secondary | ICD-10-CM | POA: Diagnosis not present

## 2024-02-18 DIAGNOSIS — I130 Hypertensive heart and chronic kidney disease with heart failure and stage 1 through stage 4 chronic kidney disease, or unspecified chronic kidney disease: Secondary | ICD-10-CM | POA: Diagnosis present

## 2024-02-18 DIAGNOSIS — N1831 Chronic kidney disease, stage 3a: Secondary | ICD-10-CM | POA: Diagnosis present

## 2024-02-18 DIAGNOSIS — Z95810 Presence of automatic (implantable) cardiac defibrillator: Secondary | ICD-10-CM | POA: Diagnosis not present

## 2024-02-18 DIAGNOSIS — Z66 Do not resuscitate: Secondary | ICD-10-CM | POA: Diagnosis present

## 2024-02-18 DIAGNOSIS — Z7189 Other specified counseling: Secondary | ICD-10-CM | POA: Diagnosis not present

## 2024-02-18 DIAGNOSIS — F17210 Nicotine dependence, cigarettes, uncomplicated: Secondary | ICD-10-CM | POA: Diagnosis present

## 2024-02-18 DIAGNOSIS — F4310 Post-traumatic stress disorder, unspecified: Secondary | ICD-10-CM | POA: Diagnosis not present

## 2024-02-18 DIAGNOSIS — I428 Other cardiomyopathies: Secondary | ICD-10-CM | POA: Diagnosis present

## 2024-02-18 DIAGNOSIS — R918 Other nonspecific abnormal finding of lung field: Secondary | ICD-10-CM | POA: Diagnosis not present

## 2024-02-18 DIAGNOSIS — F319 Bipolar disorder, unspecified: Secondary | ICD-10-CM | POA: Diagnosis present

## 2024-02-18 DIAGNOSIS — J449 Chronic obstructive pulmonary disease, unspecified: Secondary | ICD-10-CM | POA: Diagnosis not present

## 2024-02-18 DIAGNOSIS — F4321 Adjustment disorder with depressed mood: Secondary | ICD-10-CM | POA: Diagnosis not present

## 2024-02-18 DIAGNOSIS — J4489 Other specified chronic obstructive pulmonary disease: Secondary | ICD-10-CM | POA: Diagnosis present

## 2024-02-18 DIAGNOSIS — N179 Acute kidney failure, unspecified: Secondary | ICD-10-CM | POA: Diagnosis present

## 2024-02-18 DIAGNOSIS — F419 Anxiety disorder, unspecified: Secondary | ICD-10-CM | POA: Diagnosis present

## 2024-02-18 DIAGNOSIS — F329 Major depressive disorder, single episode, unspecified: Secondary | ICD-10-CM | POA: Diagnosis not present

## 2024-02-18 DIAGNOSIS — R45851 Suicidal ideations: Secondary | ICD-10-CM | POA: Diagnosis present

## 2024-02-18 DIAGNOSIS — R519 Headache, unspecified: Secondary | ICD-10-CM | POA: Diagnosis not present

## 2024-02-18 DIAGNOSIS — F1990 Other psychoactive substance use, unspecified, uncomplicated: Secondary | ICD-10-CM | POA: Diagnosis not present

## 2024-02-18 LAB — CBC
HCT: 50.2 % (ref 39.0–52.0)
Hemoglobin: 16.8 g/dL (ref 13.0–17.0)
MCH: 33.5 pg (ref 26.0–34.0)
MCHC: 33.5 g/dL (ref 30.0–36.0)
MCV: 100.2 fL — ABNORMAL HIGH (ref 80.0–100.0)
Platelets: 161 10*3/uL (ref 150–400)
RBC: 5.01 MIL/uL (ref 4.22–5.81)
RDW: 14.9 % (ref 11.5–15.5)
WBC: 2.1 10*3/uL — ABNORMAL LOW (ref 4.0–10.5)
nRBC: 0 % (ref 0.0–0.2)

## 2024-02-18 LAB — COMPREHENSIVE METABOLIC PANEL
ALT: 40 U/L (ref 0–44)
AST: 26 U/L (ref 15–41)
Albumin: 2.6 g/dL — ABNORMAL LOW (ref 3.5–5.0)
Alkaline Phosphatase: 61 U/L (ref 38–126)
Anion gap: 11 (ref 5–15)
BUN: 18 mg/dL (ref 8–23)
CO2: 22 mmol/L (ref 22–32)
Calcium: 7.7 mg/dL — ABNORMAL LOW (ref 8.9–10.3)
Chloride: 106 mmol/L (ref 98–111)
Creatinine, Ser: 1.46 mg/dL — ABNORMAL HIGH (ref 0.61–1.24)
GFR, Estimated: 54 mL/min — ABNORMAL LOW (ref >60)
Glucose, Bld: 84 mg/dL (ref 70–99)
Potassium: 3.3 mmol/L — ABNORMAL LOW (ref 3.5–5.1)
Sodium: 139 mmol/L (ref 135–145)
Total Bilirubin: 1.6 mg/dL — ABNORMAL HIGH (ref 0.0–1.2)
Total Protein: 4.8 g/dL — ABNORMAL LOW (ref 6.5–8.1)

## 2024-02-18 LAB — HEPARIN LEVEL (UNFRACTIONATED)
Heparin Unfractionated: 0.71 [IU]/mL — ABNORMAL HIGH (ref 0.30–0.70)
Heparin Unfractionated: 0.53 [IU]/mL (ref 0.30–0.70)

## 2024-02-18 LAB — GLUCOSE, CAPILLARY: Glucose-Capillary: 110 mg/dL — ABNORMAL HIGH (ref 70–99)

## 2024-02-18 LAB — BRAIN NATRIURETIC PEPTIDE: B Natriuretic Peptide: 1333.4 pg/mL — ABNORMAL HIGH (ref 0.0–100.0)

## 2024-02-18 LAB — MAGNESIUM: Magnesium: 2.1 mg/dL (ref 1.7–2.4)

## 2024-02-18 MED ORDER — TECHNETIUM TO 99M ALBUMIN AGGREGATED
4.2000 | Freq: Once | INTRAVENOUS | Status: AC | PRN
  Administered 2024-02-18: 4.2 via INTRAVENOUS

## 2024-02-18 MED ORDER — POTASSIUM CHLORIDE CRYS ER 20 MEQ PO TBCR
40.0000 meq | EXTENDED_RELEASE_TABLET | ORAL | Status: AC
Start: 2024-02-18 — End: 2024-02-18
  Administered 2024-02-18 (×2): 40 meq via ORAL
  Filled 2024-02-18 (×2): qty 2

## 2024-02-18 MED ORDER — ONDANSETRON HCL 4 MG/2ML IJ SOLN
4.0000 mg | Freq: Four times a day (QID) | INTRAMUSCULAR | Status: DC | PRN
  Administered 2024-02-18 – 2024-03-07 (×13): 4 mg via INTRAVENOUS
  Filled 2024-02-18 (×15): qty 2

## 2024-02-18 NOTE — Progress Notes (Addendum)
 PROGRESS NOTE    Ryan Long  HYQ:[id number] DOB: 10-14-1961 DOA: 02/16/2024 PCP: Cristino Martes, NP    Brief Narrative:   Ryan Long is a 63 y.o. male with past medical history significant for chronic HFrEF/end-stage cardiomyopathy status post AICD, NSVT, HIV, hypertension, hyperlipidemia, COPD, anxiety, depression, bipolar disorder, substance abuse, BPH, CKD stage IIIa who presented to [facility] ED on 02/16/2024 with complaints of chest pain, shortness of breath. Patient states since he left the hospital last time he has continued to have shortness of breath, heart palpitations, lightheadedness, and substernal chest pain. Symptoms have been worse for the past few days prompting him to come into the ED to be evaluated. He is taking his home medications as prescribed. Patient states he was previously advised to go to a skilled nursing facility and had declined but now regrets his decision. He is tearful and concerned that his advanced heart failure is affecting the quality of his life. Patient mentions having thoughts of walking to the street and jumping in front of a car to end his life and states he has not been able to do so as he is barely able to walk due to shortness of breath.   In the ED, Temperature 97.8 F, pulse 115, respiratory rate 21, blood pressure 123/94, and SpO2 100% on room air. Labs showing WBC count 2.7 (was 3.4 on labs 3 weeks ago), hemoglobin 18.1 (previously elevated as well and stable), creatinine 1.7 (was 1.4 on labs 2 weeks ago), BNP 1959, troponin negative, lactic acid normal, blood cultures collected, UDS pending. Chest x-ray showing no active cardiopulmonary disease. ED physician discussed the case with on-call cardiologist Dr. Orson Aloe who recommended admitting the patient to [facility] by hospitalist service and requested that cardiology team is consulted during daytime. Patient was given IV Lasix 40 mg. TRH called to admit.   Assessment &  Plan:   Acute on chronic HFrEF/end-stage nonischemic cardiomyopathy TTE October 2024 showing EF 20 to 25%.  Patient was recently admitted 2/5-2/21 for worsening heart failure symptoms and VT storm.  Palliative care was consulted and patient had decided to pursue hospice so his ICD was inactivated.  He is now presenting with worsening dyspnea on exertion and lower extremity edema. BNP elevated at 1959.  Chest x-ray not suggestive of pulmonary edema.  -- Cardiology following, appreciate assistance -- Lasix 40 mg IV every 12 hours -- Spironolactone 25 mg p.o. daily -- Jardiance 10 mg p.o. daily -- Midodrine 10 mg p.o. 3 times daily for blood pressure support -- 1200 mL fluid restriction -- Strict I's and O's and daily weights -- BMP, BNP daily   Chest pain/elevated D-dimer Troponin x negative and EKG without acute ischemic changes.  Recent cath done in January showing normal coronaries. ?PE but patient is not hypoxic, satting 100% on room air.  Avoiding CT with contrast given CKD.  UDS negative. -- started on heparin drip for concern of embolism, elevated D-dimer -- Nuclear medicine VQ scan: Pending -- Vascular duplex ultrasound bilateral lower extremities: Negative for DVT   AKI on CKD stage IIIa Likely cardiorenal in etiology.  Creatinine was 1.4 on labs 2 weeks ago and now increased to 1.7 on admission; likely secondary to decompensated CHF as above. -- Cr 1.71>1.49>1.46 -- Lasix as above -- BMP daily  Hypokalemia Potassium 3.3, will replete. -- Repeat electrolytes in a.m.   Suicidal ideation History of anxiety, depression, bipolar disorder Patient is tearful and concerned that his advanced heart failure is  affecting the quality of his life.  He mentions having thoughts of walking to the street and jumping in front of a car to end his life and states he has not been able to do so as he is barely able to walk due to shortness of breath.  He also repeatedly told RN that he is having  thoughts of jumping in front of a car to end his life.   -- Psychiatry following, appreciate assistance -- Clonazepam 0.125 mg p.o. 3 times daily -- Remeron 7.5 g p.o. nightly -- Soil scientist, suicide precautions   History of VT Continue amiodarone 200 mg  PO BID and mexiletine 250 mg PO BID. -- Monitor on telemetry   HIV -- continue Biktarvy, Valtrex due to history of recurrent HSV. -- outpatient follow-up with infectious disease   COPD Stable, no wheezing.   -- Breo Ellipta 1 puff daily --Albuterol neb every 6 hours as needed wheezing/shortness of breath   BPH Continue home medications.  Goals of care: Patient recently transition to hospice.  During previous admission treatment team seeking placement [facility] patient declined.  Patient is regretful not seeking placement as he needs a lot of care and now amenable. --Palliative care following, appreciate assistance   DVT prophylaxis:     Code Status: Limited: Do not attempt resuscitation (DNR) -DNR-LIMITED -Do Not Intubate/DNI  Family Communication:   Disposition Plan:  Level of care: Progressive Status is: Observation The patient remains OBS appropriate and will d/c before 2 midnights.    Consultants:  Cardiology Psychiatry Palliative care  Procedures:  Vascular duplex ultrasound bilateral extremities: negative for DVT NM VQ scan: Pending  Antimicrobials:  None   Subjective: Patient seen examined bedside, sitting at edge of bed eating breakfast.  Continues with intermittent chest pain, palpitations, dyspnea; although improved from yesterday.  Vascular blood ultrasound yesterday negative for DVT.  Pending VQ scan today.  Remains on heparin drip.  Reports good urine output from IV Lasix.  No other specific complaints, concerns or questions at this time.  Denies headache, no dizziness, no abdominal pain, no cough/congestion, no fever/chills/night sweats, no nausea/vomiting/diarrhea, no focal weakness, no fatigue,  no paresthesia.  Objective: Vitals:   02/17/24 2119 02/17/24 2326 02/18/24 0451 02/18/24 0500  BP: 98/73 94/73 96/77    Pulse: 98 96 91   Resp: 19 20 19    Temp: 97.6 F (36.4 C) (!) 97.3 F (36.3 C) (!) 97.5 F (36.4 C)   TempSrc: Oral Oral Oral   SpO2: 100% 99% 100%   Weight:    82.4 kg  Height:        Intake/Output Summary (Last 24 hours) at 02/18/2024 1026 Last data filed at 02/18/2024 0500 Gross per 24 hour  Intake 626.62 ml  Output 1325 ml  Net -698.38 ml   Filed Weights   02/16/24 1934 02/17/24 1054 02/18/24 0500  Weight: 86.6 kg 82.4 kg 82.4 kg    Examination:  Physical Exam: GEN: NAD, alert and oriented x 3, chronically ill appearance, appears older than stated age HEENT: NCAT, PERRL, EOMI, sclera clear, MMM PULM: Breath sounds slight diminished bilateral bases, no wheezes/crackles, normal respiratory effort without accessory muscle use, on room air with SpO2 94% at rest CV: RRR w/o M/G/R GI: abd soft, NTND, + BS MSK: + LE peripheral edema, moves all extremities dependently NEURO: No focal neurological deficit PSYCH: Depressed mood, flat affect, + SI, denies HI Integumentary: No concerning rashes/lesions/wounds noted on exposed skin surfaces    Data Reviewed: I have  personally reviewed following labs and imaging studies  CBC: Recent Labs  Lab 02/16/24 1930 02/18/24 0535  WBC 2.7* 2.1*  HGB 18.1* 16.8  HCT 54.3* 50.2  MCV 100.0 100.2*  PLT 195 161   Basic Metabolic Panel: Recent Labs  Lab 02/16/24 1930 02/17/24 0615 02/18/24 0535  NA 140 139 139  K 4.1 3.6 3.3*  CL 108 109 106  CO2 22 19* 22  GLUCOSE 88 73 84  BUN 18 17 18   CREATININE 1.71* 1.49* 1.46*  CALCIUM 8.6* 7.9* 7.7*  MG  --   --  2.1   GFR: Estimated Creatinine Clearance: 54.9 mL/min (A) (by C-G formula based on SCr of 1.46 mg/dL (H)). Liver Function Tests: Recent Labs  Lab 02/17/24 0615 02/18/24 0535  AST 37 26  ALT 45* 40  ALKPHOS 65 61  BILITOT 2.2* 1.6*  PROT 5.6*  4.8*  ALBUMIN 2.9* 2.6*   No results for input(s): "LIPASE", "AMYLASE" in the last 168 hours. No results for input(s): "AMMONIA" in the last 168 hours. Coagulation Profile: Recent Labs  Lab 02/17/24 1027  INR 1.2   Cardiac Enzymes: No results for input(s): "CKTOTAL", "CKMB", "CKMBINDEX", "TROPONINI" in the last 168 hours. BNP (last 3 results) No results for input(s): "PROBNP" in the last 8760 hours. HbA1C: No results for input(s): "HGBA1C" in the last 72 hours. CBG: No results for input(s): "GLUCAP" in the last 168 hours. Lipid Profile: No results for input(s): "CHOL", "HDL", "LDLCALC", "TRIG", "CHOLHDL", "LDLDIRECT" in the last 72 hours. Thyroid Function Tests: No results for input(s): "TSH", "T4TOTAL", "FREET4", "T3FREE", "THYROIDAB" in the last 72 hours. Anemia Panel: No results for input(s): "VITAMINB12", "FOLATE", "FERRITIN", "TIBC", "IRON", "RETICCTPCT" in the last 72 hours. Sepsis Labs: Recent Labs  Lab 02/16/24 2049  LATICACIDVEN 1.4    Recent Results (from the past 240 hours)  Culture, blood (Routine x 2)     Status: None (Preliminary result)   Collection Time: 02/16/24  8:40 PM   Specimen: BLOOD  Result Value Ref Range Status   Specimen Description   Final    BLOOD RIGHT ANTECUBITAL Performed at [facility], [address], [name], [place] [address]    Special Requests   Final    BOTTLES DRAWN AEROBIC AND ANAEROBIC Blood Culture adequate volume Performed at [facility], [address], [place], [place] [address]    Culture   Final    NO GROWTH 1 DAY Performed at [facility], [address], [place], [place] [address]    Report Status PENDING  Incomplete  Culture, blood (Routine x 2)     Status: None (Preliminary result)   Collection Time: 02/16/24  9:00 PM   Specimen: BLOOD  Result Value Ref Range Status   Specimen Description   Final    BLOOD LEFT ANTECUBITAL Performed at [facility],  [address], [place], [place] [address]    Special Requests   Final    BOTTLES DRAWN AEROBIC AND ANAEROBIC Blood Culture adequate volume Performed at [facility], [address] [place], [place] [address]    Culture   Final    NO GROWTH 1 DAY Performed at [facility], [address], [place], [place] [address]    Report Status PENDING  Incomplete         Radiology Studies: VAS [place] LOWER EXTREMITY VENOUS (DVT) Result Date: 02/17/2024  Lower Venous DVT Study Patient Name:  SADIE PICKAR  Date  of Exam:   02/17/2024 Medical Rec #: [id number]              Accession #:    [id number] Date of Birth: 1961-05-11              Patient Gender: M Patient Age:   20 years Exam Location:  [facility] Procedure:      VAS [place] LOWER EXTREMITY VENOUS (DVT) Referring Phys: Krishna Heuer [place] --------------------------------------------------------------------------------  Indications: Edema, SOB, and Chest pain, HFrEF, s/p AICD 2019.  Comparison Study: No prior exam. Performing Technologist: Fernande Bras  Examination Guidelines: A complete evaluation includes B-mode imaging, spectral Doppler, color Doppler, and power Doppler as needed of all accessible portions of each vessel. Bilateral testing is considered an integral part of a complete examination. Limited examinations for reoccurring indications may be performed as noted. The reflux portion of the exam is performed with the patient in reverse Trendelenburg.  +---------+---------------+---------+-----------+----------+--------------+ RIGHT    CompressibilityPhasicitySpontaneityPropertiesThrombus Aging +---------+---------------+---------+-----------+----------+--------------+ CFV      Full           Yes      Yes                                 +---------+---------------+---------+-----------+----------+--------------+ SFJ      Full           Yes      Yes                                  +---------+---------------+---------+-----------+----------+--------------+ FV Prox  Full                                                        +---------+---------------+---------+-----------+----------+--------------+ FV Mid   Full                                                        +---------+---------------+---------+-----------+----------+--------------+ FV DistalFull                                                        +---------+---------------+---------+-----------+----------+--------------+ PFV      Full                                                        +---------+---------------+---------+-----------+----------+--------------+ POP      Full           Yes      Yes                                 +---------+---------------+---------+-----------+----------+--------------+ PTV      Full                                                        +---------+---------------+---------+-----------+----------+--------------+  PERO     Full                                                        +---------+---------------+---------+-----------+----------+--------------+   +---------+---------------+---------+-----------+----------+--------------+ LEFT     CompressibilityPhasicitySpontaneityPropertiesThrombus Aging +---------+---------------+---------+-----------+----------+--------------+ CFV      Full           Yes      Yes                                 +---------+---------------+---------+-----------+----------+--------------+ SFJ      Full           Yes      Yes                                 +---------+---------------+---------+-----------+----------+--------------+ FV Prox  Full                                                        +---------+---------------+---------+-----------+----------+--------------+ FV Mid   Full                                                         +---------+---------------+---------+-----------+----------+--------------+ FV DistalFull                                                        +---------+---------------+---------+-----------+----------+--------------+ PFV      Full                                                        +---------+---------------+---------+-----------+----------+--------------+ POP      Full           Yes      Yes                                 +---------+---------------+---------+-----------+----------+--------------+ PTV      Full                                                        +---------+---------------+---------+-----------+----------+--------------+ PERO     Full                                                        +---------+---------------+---------+-----------+----------+--------------+  Summary: BILATERAL: - No evidence of deep vein thrombosis seen in the lower extremities, bilaterally. -No evidence of popliteal cyst, bilaterally.   *See table(s) above for measurements and observations. Electronically signed by Coral Else MD on 02/17/2024 at 9:27:36 PM.    Final    DG Chest 2 View Result Date: 02/16/2024 CLINICAL DATA:  Chest pain shortness of breath for several hours, initial encounter EXAM: CHEST - 2 VIEW COMPARISON:  01/23/2024 FINDINGS: Cardiac shadow is enlarged but stable. Defibrillator is again seen. Lungs are well aerated bilaterally. No bony abnormality is noted. IMPRESSION: No active cardiopulmonary disease. Electronically Signed   By: Alcide Clever M.D.   On: 02/16/2024 20:40        Scheduled Meds:  amiodarone  200 mg Oral BID   aspirin EC  81 mg Oral Daily   bictegravir-emtricitabine-tenofovir AF  1 tablet Oral Daily   clonazepam  0.125 mg Oral TID   dronabinol  5 mg Oral BID AC   empagliflozin  10 mg Oral QAC breakfast   finasteride  5 mg Oral Daily   fluticasone furoate-vilanterol  1 puff Inhalation Daily   furosemide  40 mg Intravenous BID    mexiletine  250 mg Oral Q12H   midodrine  10 mg Oral TID WC   mirtazapine  7.5 mg Oral QHS   polyethylene glycol  17 g Oral Daily   potassium chloride  40 mEq Oral Q3H   spironolactone  25 mg Oral Daily   tamsulosin  0.4 mg Oral BID   valACYclovir  500 mg Oral Daily   Continuous Infusions:  heparin 1,150 Units/hr (02/18/24 0657)     LOS: 0 days    Time spent: 56 minutes spent on chart review, discussion with nursing staff, consultants, updating family and interview/physical exam; more than 50% of that time was spent in counseling and/or coordination of care.    Alvira Philips [place], DO [facility] Available via Epic secure chat 7am-7pm After these hours, please refer to coverage provider listed on [email or web address] 02/18/2024, 10:26 AM

## 2024-02-18 NOTE — Progress Notes (Signed)
 PHARMACY - ANTICOAGULATION CONSULT NOTE  Pharmacy Consult for heparin  Indication: r/o pulmonary embolus  No Known Allergies  Patient Measurements: Height: 5\' 8"  (172.7 cm) Weight: 82.4 kg (181 lb 10.5 oz) IBW/kg (Calculated) : 68.4 Heparin Dosing Weight: 82 kg  Vital Signs: Temp: 97.6 F (36.4 C) (03/03 1229) Temp Source: Oral (03/03 1229) BP: 92/69 (03/03 1229) Pulse Rate: 107 (03/03 1229)  Labs: Recent Labs    02/16/24 1930 02/17/24 0615 02/17/24 1027 02/17/24 1943 02/18/24 0535  HGB 18.1*  --   --   --  16.8  HCT 54.3*  --   --   --  50.2  PLT 195  --   --   --  161  LABPROT  --   --  15.3*  --   --   INR  --   --  1.2  --   --   HEPARINUNFRC  --   --   --  0.90* 0.71*  CREATININE 1.71* 1.49*  --   --  1.46*  TROPONINIHS 15 13  --   --   --     Estimated Creatinine Clearance: 54.9 mL/min (A) (by C-G formula based on SCr of 1.46 mg/dL (H)).   Medical History: Past Medical History:  Diagnosis Date   Active smoker    AICD (automatic cardioverter/defibrillator) present    Alcohol abuse    Allergy July 2023   Anxiety    AR (allergic rhinitis)    Bipolar 1 disorder (HCC)    Cervical lymphadenitis 04/20/2021   CHF (congestive heart failure) (HCC)    Chronic bronchitis (HCC)    Chronic systolic heart failure (HCC)    Controlled substance agreement signed 10/22/2018   COPD (chronic obstructive pulmonary disease) (HCC)    COPD (chronic obstructive pulmonary disease) (HCC) 10/04/2015   Cough 12/31/2018   Crack cocaine use    Depression    Genital herpes    HIV (human immunodeficiency virus infection) (HCC) dx'd 08/1993   HLD (hyperlipidemia)    Hypertension    NICM (nonischemic cardiomyopathy) (HCC)    Echocardiogram 06/28/11: EF 30-35%, mild MR, mild LAE;  No CAD by coronary CT angiogram 3/12 at [facility]   NSVT (nonsustained ventricular tachycardia) (HCC)    PTSD (post-traumatic stress disorder)     Assessment: Patient is a 63 y.o M  with hx HF, CKD, COPD and on hospice care PTA who presented to the ED on 02/16/24 with c/o CP and SOB. D-dimer was found to be elevated on admission. He is currently on heparin drip for r/o PE.  - 3/2 LE doppler: neg for DVT  Today, 02/18/2024: - heparin level collected at 12:34p is therapeutic at 0.53 - cbc ok - scr 1.46 - no bleeding documented   Goal of Therapy:  Heparin level 0.3-0.7 units/ml Monitor platelets by anticoagulation protocol: Yes   Plan:  - continue heparin drip at 1150 units/hr - recheck another heparin  level at 7:30p to ensure level is still therapeutic before changing to daily monitoring - f/u V/Q scan result  Elecia Serafin P 02/18/2024,1:28 PM

## 2024-02-18 NOTE — Progress Notes (Signed)
   Palliative Medicine Inpatient Follow Up Note HPI: Ryan Long is a 63 y.o. male with medical history significant of chronic HFrEF/end-stage cardiomyopathy status post AICD, NSVT, HIV, hypertension, hyperlipidemia, COPD, anxiety, depression, bipolar disorder, substance abuse, BPH, CKD stage IIIa.     Patient was discharged from [facility] and readmitted with heart failure exacerbation. The PMT has been asked to support additional goals of care conversations.   Today's Discussion 02/18/2024  *Please note that this is a verbal dictation therefore any spelling or grammatical errors are due to the "Dragon Medical One" system interpretation.  Chart reviewed inclusive of vital signs, progress notes, laboratory results, and diagnostic images.   I met with [name] at bedside this morning. He is pleasant and in good spirits. He endorses feeling better in the present environment.  From a symptom perspective [name] states that he has a slight headache. He has improvement in his shortness of breath. He denies nausea and has a fair appetite.   Created space and opportunity for patient to explore thoughts feelings and fears regarding current medical situation. He notes his illness. Ideally his goal is to be in a safe environment. He agrees to skilled nursing placement.   While present at bedside the chaplain, Amy present herself. I shared that I would give them time and space to speak. I kindly excused myself.   Questions and concerns addressed/Palliative Support Provided.   Objective Assessment: Vital Signs Vitals:   02/17/24 2326 02/18/24 0451  BP: 94/73 96/77  Pulse: 96 91  Resp: 20 19  Temp: (!) 97.3 F (36.3 C) (!) 97.5 F (36.4 C)  SpO2: 99% 100%    Intake/Output Summary (Last 24 hours) at 02/18/2024 1119 Last data filed at 02/18/2024 0500 Gross per 24 hour  Intake 626.62 ml  Output 1325 ml  Net -698.38 ml   Last Weight  Most recent update: 02/18/2024  5:55 AM    Weight  82.4 kg (181  lb 10.5 oz)            Gen: Older African-American male in no acute distress HEENT: moist mucous membranes CV: Regular rate and rhythm PULM: On RA,  breathing is even and nonlabored ABD: soft/nontender EXT: (+) BLE edema Neuro: Alert and oriented x3  SUMMARY OF RECOMMENDATIONS   DNAR/DNI   [name] is now agreeable to placement with OP Palliative and eventual hospice support   Appreciate psychiatry helping [name] navigate his SI   Ongoing palliative care support  Billing based on MDM: Moderate ______________________________________________________________________________________ [name] Palliative Medicine Team Team Cell Phone: [phone] Please utilize secure chat with additional questions, if there is no response within 30 minutes please call the above phone number  Palliative Medicine Team providers are available by phone from 7am to 7pm daily and can be reached through the team cell phone.  Should this patient require assistance outside of these hours, please call the patient's attending physician.

## 2024-02-18 NOTE — Progress Notes (Signed)
 PHARMACY - ANTICOAGULATION CONSULT NOTE  Pharmacy Consult for IV UFH Indication: elevated d-dimer, rule out DVT/PE  No Known Allergies  Patient Measurements: Height: 5\' 8"  (172.7 cm) Weight: 82.4 kg (181 lb 10.5 oz) IBW/kg (Calculated) : 68.4 Heparin Dosing Weight: 86 kg  Vital Signs: Temp: 97.5 F (36.4 C) (03/03 0451) Temp Source: Oral (03/03 0451) BP: 96/77 (03/03 0451) Pulse Rate: 91 (03/03 0451)  Labs: Recent Labs    02/16/24 1930 02/17/24 0615 02/17/24 1027 02/17/24 1943 02/18/24 0535  HGB 18.1*  --   --   --  16.8  HCT 54.3*  --   --   --  50.2  PLT 195  --   --   --  161  LABPROT  --   --  15.3*  --   --   INR  --   --  1.2  --   --   HEPARINUNFRC  --   --   --  0.90* 0.71*  CREATININE 1.71* 1.49*  --   --   --   TROPONINIHS 15 13  --   --   --     Estimated Creatinine Clearance: 53.8 mL/min (A) (by C-G formula based on SCr of 1.49 mg/dL (H)).   Medical History: Past Medical History:  Diagnosis Date   Active smoker    AICD (automatic cardioverter/defibrillator) present    Alcohol abuse    Allergy July 2023   Anxiety    AR (allergic rhinitis)    Bipolar 1 disorder (HCC)    Cervical lymphadenitis 04/20/2021   CHF (congestive heart failure) (HCC)    Chronic bronchitis (HCC)    Chronic systolic heart failure (HCC)    Controlled substance agreement signed 10/22/2018   COPD (chronic obstructive pulmonary disease) (HCC)    COPD (chronic obstructive pulmonary disease) (HCC) 10/04/2015   Cough 12/31/2018   Crack cocaine use    Depression    Genital herpes    HIV (human immunodeficiency virus infection) (HCC) dx'd 08/1993   HLD (hyperlipidemia)    Hypertension    NICM (nonischemic cardiomyopathy) (HCC)    Echocardiogram 06/28/11: EF 30-35%, mild MR, mild LAE;  No CAD by coronary CT angiogram 3/12 at [facility]   NSVT (nonsustained ventricular tachycardia) (HCC)    PTSD (post-traumatic stress disorder)     Medications:  Scheduled:    amiodarone  200 mg Oral BID   aspirin EC  81 mg Oral Daily   bictegravir-emtricitabine-tenofovir AF  1 tablet Oral Daily   clonazepam  0.125 mg Oral TID   dronabinol  5 mg Oral BID AC   empagliflozin  10 mg Oral QAC breakfast   finasteride  5 mg Oral Daily   fluticasone furoate-vilanterol  1 puff Inhalation Daily   furosemide  40 mg Intravenous BID   mexiletine  250 mg Oral Q12H   midodrine  10 mg Oral TID WC   mirtazapine  7.5 mg Oral QHS   polyethylene glycol  17 g Oral Daily   spironolactone  25 mg Oral Daily   tamsulosin  0.4 mg Oral BID   valACYclovir  500 mg Oral Daily   Infusions:   heparin 1,250 Units/hr (02/18/24 0250)    Assessment: 63 yo admitted with recurrent worsening HF now with elevated d-dimer to empirically start IV heparin until can rule out or in DVT/PE.   02/18/2024: Heparin level 0.71- improved but still slightly elevated on IV heparin 1250 units/hr CBC: Hg/pltc WNL No bleeding or infusion related concerns  noted  Goal of Therapy:  Heparin level 0.3-0.7 units/ml Monitor platelets by anticoagulation protocol: Yes   Plan:  Decrease IV heparin to 1150 units/hr Recheck heparin level 6 hours after rate change Daily CBC and heparin level F/U VQ scan results   Junita Push PharmD 02/18/2024,6:38 AM

## 2024-02-18 NOTE — Telephone Encounter (Signed)
 Pharmacy Patient Advocate Encounter  Received notification from [facility] that Prior Authorization for dronabinol has been DENIED.  See denial reason below. No denial letter attached in CMM. Will attach denial letter to Media tab once received.

## 2024-02-18 NOTE — Telephone Encounter (Signed)
 I ran a test claim at our pharmacy it was 229.48. there is no patient assistance available for this medication. They could get goodrx and that should bring it down to 90-100.00

## 2024-02-18 NOTE — Progress Notes (Addendum)
 Patient Name: Ryan Long Date of Encounter: 02/18/2024 [facility] Cardiologist: Maisie Fus, MD   Interval Summary  .    Reported having improved shortness of breath. Stated that he had chest pain but that this was present prior to the left heart cath he received on 12/2023.  Vital Signs .    Vitals:   02/17/24 2119 02/17/24 2326 02/18/24 0451 02/18/24 0500  BP: 98/73 94/73 96/77    Pulse: 98 96 91   Resp: 19 20 19    Temp: 97.6 F (36.4 C) (!) 97.3 F (36.3 C) (!) 97.5 F (36.4 C)   TempSrc: Oral Oral Oral   SpO2: 100% 99% 100%   Weight:    82.4 kg  Height:        Intake/Output Summary (Last 24 hours) at 02/18/2024 1116 Last data filed at 02/18/2024 0500 Gross per 24 hour  Intake 626.62 ml  Output 1325 ml  Net -698.38 ml      02/18/2024    5:00 AM 02/17/2024   10:54 AM 02/16/2024    7:34 PM  Last 3 Weights  Weight (lbs) 181 lb 10.5 oz 181 lb 10.5 oz 191 lb  Weight (kg) 82.4 kg 82.4 kg 86.637 kg      Telemetry/ECG     - Personally Reviewed. Normal sinus rhythm with some PVC's, some tachycardia. A previous non sustained vtach lasting 9 beats.  Physical Exam .   GEN: No acute distress.   Neck: Positive JVD Cardiac: RRR, no murmurs, rubs, or gallops.  Respiratory: Bibasilar crackles  MS: 3+ edema  Assessment & Plan .   Ryan Long is a 63 y.o. male with past medical history significant for chronic HFrEF/end-stage cardiomyopathy status post AICD, NSVT, HIV, hypertension, hyperlipidemia, COPD, anxiety, depression, bipolar disorder, substance abuse, BPH, CKD stage IIIa who presented to [facility] ED on 02/16/2024 with complaints of chest pain, shortness of breath   Chronic HFrEF nonischemic cardiomyopathy Initially was diagnosed with HFrEF on 2018. Here with decompensated heart failure.  He is palliative and open to hospice.   -Continue IV  Lasix 40 mg IV twice daily for one more day. Is volume overloaded on exam with 3+ swelling  and bibasilar crackles. Reported shortness of breath has improved. Cr today is 1.46 this appears to be baseline. Today's K is 3.3, Mg 2.1. continue potassium replacement. Goal K > 4.0. with lower blood pressures (most recent BP 96/77) will hold off on increasing diuresis.  - BNP today is 1,333.4, down from 1959. - Continue midodrine 10 mg three times daily.  - Continue aspirin 81 mg  - With current palliative care status and lower blood pressures will hold off on starting or titration of GDMT. -Continue Jardiance 10 mg daily -Continue spironolactone 25 mg daily   Recent VT storm -ICD tachy therapies have been turned off -Continue oral amiodarone 200mg  twice daily and mexiletine 250 mg twice daily  Chest Pain LHC on 01/03/24 showed normal coronaries. Reported this pain was present prior to getting that LHC. Troponin was negative at 13 on 02/17/24, Cr today is 1.46. This is about baseline. Is a poor candidate for intervention with current palliative status.   For questions or updates, please contact [facility] Please consult [email or web address] for contact info under        Signed, Arabella Merles, PA-C    I have seen and examined the patient along with Arabella Merles, PA-C .  I have reviewed the chart, notes and  new data.  I agree with PA/NP's note.  Key new complaints: breathing has improved, still lots of edema Key examination changes: clear lungs, but has resting tachycardia, JVD and 3+ pitting edema of the legs. Borderline low BP. Key new findings / data: creatinine close to recent baseline and has improved with diuresis. K 3.3. BNP better, still well above baseline (180-190)  PLAN: Continue diuretics, midodrine for symptom relief. Palliative care. Prognosis likely to be measured in weeks, not months. ICD therapies are off. When he is ready, it is reasonable to stop his antiarrhythmics.  Thurmon Fair, MD, [facility] [phone] 02/18/2024, 12:49 PM

## 2024-02-18 NOTE — Progress Notes (Signed)
 Heart Failure Navigator Progress Note  Assessed for Heart & Vascular TOC clinic readiness.  Patient does not meet criteria due to Advanced Heart Failure Team patient of Dr. Gala Romney. .   Navigator will sign off at this time.   Rhae Hammock, BSN, Scientist, clinical (histocompatibility and immunogenetics) Only

## 2024-02-18 NOTE — NC FL2 (Signed)
 [place] MEDICAID FL2 LEVEL OF CARE FORM     IDENTIFICATION  Patient Name: Ryan Long Birthdate: [birth year] Sex: male Admission Date (Current Location): 02/16/2024  [facility] and [place] Number:  Producer, television/film/video and Address:  [facility],  [address] [place], [place] [address]      Provider Number: [id number]  Attending Physician Name and Address:  [place], Alvira Philips, DO  Relative Name and Phone Number:       Current Level of Care: Hospital Recommended Level of Care: Skilled Nursing Facility Prior Approval Number:    Date Approved/Denied:   PASRR Number: [id number]  Discharge Plan: SNF    Current Diagnoses: Patient Active Problem List   Diagnosis Date Noted   Acute on chronic HFrEF (heart failure with reduced ejection fraction) (HCC) 02/17/2024   Chest pain 02/17/2024   AKI (acute kidney injury) (HCC) 02/17/2024   Ventricular tachycardia (HCC) 01/23/2024   Acute on chronic systolic CHF (congestive heart failure) (HCC) 01/03/2024   Asthmatic bronchitis 01/24/2023   Rash and nonspecific skin eruption 12/19/2022   Arm numbness 11/15/2022   Aortic atherosclerosis (HCC) 11/15/2022   Chronic kidney disease 08/08/2022   Healthcare maintenance 08/08/2022   Cigarette smoker 01/26/2022   ETD (Eustachian tube dysfunction), right 01/26/2022   Allergy to pollen 01/26/2022   Polycythemia 12/08/2021   Vitamin D deficiency 12/08/2021   Hip pain 01/06/2021   VT (ventricular tachycardia) (HCC) 08/25/2020   HIV positive (HCC) 07/12/2020   Atrial fibrillation (HCC) 06/10/2020   BPH (benign prostatic hyperplasia) 01/28/2020   HFrEF (heart failure with reduced ejection fraction) (HCC) 01/28/2020   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   Achilles tendinitis, left leg 04/03/2019   AICD (automatic cardioverter/defibrillator) present 08/15/2018   Abnormal EKG 06/28/2018   Adjustment disorder with mixed disturbance of emotions and conduct    NICM (nonischemic  cardiomyopathy) (HCC) 01/08/2018   Anxiety 12/20/2017   Bipolar 1 disorder (HCC) 10/10/2017   Chronic systolic heart failure (HCC) 05/13/2017   HSV-2 infection 07/17/2016   History of attempted suicide 04/14/2016   History of alcoholism (HCC) 04/14/2016   History of substance abuse (HCC) 04/14/2016   Constipation 01/17/2016   COPD (chronic obstructive pulmonary disease) (HCC) 10/04/2015   Abnormal thyroid stimulating hormone (TSH) level 10/04/2015   MDD (major depressive disorder), recurrent episode, severe (HCC) 09/23/2015   Hypertension 09/10/2015   PTSD (post-traumatic stress disorder) 08/07/2015   Primary insomnia 04/02/2015   Herpesviral infection of penis 04/02/2015   Depression with suicidal ideation 07/03/2014   Generalized anxiety disorder 05/29/2014   Tobacco use disorder 02/17/2013   Heart failure (HCC) 09/19/2011   Other primary cardiomyopathies 08/24/2011   HIV (human immunodeficiency virus infection) (HCC) 02/01/2009   Recurrent HSV (herpes simplex virus) 02/01/2009   Hyperlipidemia 02/01/2009   Allergic rhinitis 02/01/2009    Orientation RESPIRATION BLADDER Height & Weight     Self, Time, Situation, Place  Normal Continent Weight: 82.4 kg Height:  5\' 8"  (172.7 cm)  BEHAVIORAL SYMPTOMS/MOOD NEUROLOGICAL BOWEL NUTRITION STATUS      Continent Diet (1200 fluid restriction;heart healthy)  AMBULATORY STATUS COMMUNICATION OF NEEDS Skin   Limited Assist Verbally Normal                       Personal Care Assistance Level of Assistance  Bathing, Feeding, Dressing Bathing Assistance: Limited assistance Feeding assistance: Limited assistance Dressing Assistance: Limited assistance Total Care Assistance: Limited assistance   Functional Limitations Info  Sight, Hearing, Speech Sight Info:  Impaired (eyeglasses) Hearing Info: Adequate Speech Info: Adequate    SPECIAL CARE FACTORS FREQUENCY  PT (By licensed PT), OT (By licensed OT)     PT Frequency: 5x  week OT Frequency: 5x week            Contractures Contractures Info: Not present    Additional Factors Info  Code Status, Allergies, Psychotropic Code Status Info: DNR Allergies Info: NKDA Psychotropic Info: klonopin,remeron         Current Medications (02/18/2024):  This is the current hospital active medication list Current Facility-Administered Medications  Medication Dose Route Frequency Provider Last Rate Last Admin   acetaminophen (TYLENOL) tablet 650 mg  650 mg Oral Q6H PRN [name] Giovanni, MD       Or   acetaminophen (TYLENOL) suppository 650 mg  650 mg Rectal Q6H PRN [name] Giovanni, MD       albuterol (PROVENTIL) (2.5 MG/3ML) 0.083% nebulizer solution 2.5 mg  2.5 mg Nebulization Q6H PRN [name] Giovanni, MD       amiodarone (PACERONE) tablet 200 mg  200 mg Oral BID [name] Giovanni, MD   200 mg at 02/18/24 0946   aspirin EC tablet 81 mg  81 mg Oral Daily [name] Giovanni, MD   81 mg at 02/18/24 0946   bictegravir-emtricitabine-tenofovir AF (BIKTARVY) 50-200-25 MG per tablet 1 tablet  1 tablet Oral Daily [name] Giovanni, MD   1 tablet at 02/18/24 0947   clonazepam (KLONOPIN) disintegrating tablet 0.125 mg  0.125 mg Oral TID [name] Giovanni, MD   0.125 mg at 02/18/24 0946   dronabinol (MARINOL) capsule 5 mg  5 mg Oral BID Marvia Pickles, MD   5 mg at 02/17/24 1714   empagliflozin (JARDIANCE) tablet 10 mg  10 mg Oral QAC breakfast [name] Giovanni, MD   10 mg at 02/18/24 0947   finasteride (PROSCAR) tablet 5 mg  5 mg Oral Daily [name] Giovanni, MD   5 mg at 02/18/24 0946   fluticasone furoate-vilanterol (BREO ELLIPTA) 200-25 MCG/ACT 1 puff  1 puff Inhalation Daily [name] Giovanni, MD       furosemide (LASIX) injection 40 mg  40 mg Intravenous BID Maisie Fus, MD   40 mg at 02/18/24 0946   heparin ADULT infusion 100 units/mL (25000 units/245mL)  1,150 Units/hr Intravenous Continuous Phylliss Blakes, RPH 11.5 mL/hr at 02/18/24 0657 1,150  Units/hr at 02/18/24 0657   mexiletine (MEXITIL) capsule 250 mg  250 mg Oral Q12H [name] Giovanni, MD   250 mg at 02/18/24 0947   midodrine (PROAMATINE) tablet 10 mg  10 mg Oral TID WC [name] Giovanni, MD   10 mg at 02/18/24 0946   mirtazapine (REMERON) tablet 7.5 mg  7.5 mg Oral QHS Stark Klein, MD   7.5 mg at 02/17/24 2359   polyethylene glycol (MIRALAX / GLYCOLAX) packet 17 g  17 g Oral Daily [name] Giovanni, MD   17 g at 02/18/24 0947   potassium chloride SA (KLOR-CON M) CR tablet 40 mEq  40 mEq Oral Q3H [place], Eric J, DO   40 mEq at 02/18/24 5409   spironolactone (ALDACTONE) tablet 25 mg  25 mg Oral Daily [name] Giovanni, MD   25 mg at 02/18/24 0946   tamsulosin (FLOMAX) capsule 0.4 mg  0.4 mg Oral BID [name] Giovanni, MD   0.4 mg at 02/18/24 0946   valACYclovir (VALTREX) tablet 500 mg  500 mg Oral Daily [name] Giovanni, MD   500 mg at 02/18/24 8119     Discharge Medications:  Please see discharge summary for a list of discharge medications.  Relevant Imaging Results:  Relevant Lab Results:   Additional Information SS#[id number], Olegario Messier, [place]

## 2024-02-18 NOTE — Progress Notes (Signed)
 OT EVALUATION:  Clinical Impression: Pt is typically independent in ADL and mobility, living at a group "recovery" home and today presents with decreased balance, activity tolerance, SOB, endurance. Pt is overall min A for UB and LB ADL, need external assist for balance with transfers and mobility and will benefit from skilled OT in the acute setting as well as afterwards at rehab level of <3 hours therapy daily. Next session bring energy conservation handout as well as continue OOB activity tolerance for functional ADL.     02/18/24 1016  OT Visit Information  Last OT Received On 02/18/24  Assistance Needed +1  History of Present Illness 63 y.o. male Patient presents to the ED complaining of chest pain and shortness of breath, SI. Dx of HFrEF. Pt with medical history significant of chronic HFrEF/end-stage cardiomyopathy status post AICD, NSVT, HIV, hypertension, hyperlipidemia, COPD, anxiety, depression, bipolar disorder, substance abuse, BPH, CKD stage IIIa.  Patient was recently admitted by cardiology service 2/5-2/21 for worsening heart failure symptoms and VT storm.  Palliative care was consulted and patient was made DNR/DNI.  Patient had decided to pursue hospice and his ICD was inactivated.  He was discharged to group home with hospice.  Precautions  Precautions Fall  Precaution/Restrictions Comments watch HR & BP  Restrictions  Weight Bearing Restrictions Per Provider Order No  Home Living  Family/patient expects to be discharged to: Skilled nursing facility ("Recovery Home" in which pt rents a room)  Living Arrangements Alone  Type of Home House  Home Access Stairs to enter  Entrance Stairs-Number of Steps 5-6 (in back, which is the entrance he typically uses; x3-4 in front)  Entrance Stairs-Rails Can reach both (at either entrance)  Home Layout Laundry or work area in basement;Two level (laundry in basement, which is the second level)  Science writer None  Additional Comments pt rents a room in this "Recovery House" and shares a bathroom with other residents  Prior Function  Prior Level of Function  Independent/Modified Independent;Driving  Mobility Comments No AD; fell 1x on October 1st 2024 due to syncope episode, only walking short distances 2* dyspnea  ADLs Comments Drives a "gas scooter"  Pain Assessment  Pain Assessment Faces  Faces Pain Scale 2  Pain Location chest pain  Pain Descriptors / Indicators Discomfort  Pain Intervention(s) Monitored during session;Repositioned  Cognition  Arousal Alert  Behavior During Therapy WFL for tasks assessed/performed  Cognition No apparent impairments  OT - Cognition Comments Pt verbally affirming SI during session, but following directions and no other cognition deficits noted - hx of mental health  Following Commands  Following commands Intact  Cueing  Cueing Techniques Verbal cues;Tactile cues  Communication  Communication No apparent difficulties  Upper Extremity Assessment  Upper Extremity Assessment Overall WFL for tasks assessed  Lower Extremity Assessment  Lower Extremity Assessment Defer to PT evaluation  Cervical / Trunk Assessment  Cervical / Trunk Assessment Normal  Vision- History  Baseline Vision/History 1 Wears glasses  Ability to See in Adequate Light 0 Adequate  Patient Visual Report No change from baseline  Vision- Assessment  Vision Assessment? No apparent visual deficits  ADL  Overall ADL's  Needs assistance/impaired  Eating/Feeding Independent  Grooming Wash/dry hands;Supervision/safety;Standing  Grooming Details (indicate cue type and reason) decreased activity tolerance  Upper Body Bathing Set up;Sitting  Lower Body Bathing Set up;Sitting/lateral leans  Lower Body Bathing Details (indicate cue type and reason) needs seated position for safety  and generalized weakness  Upper Body Dressing  Minimal assistance   Upper Body Dressing Details (indicate cue type and reason) donning new gown  Lower Body Dressing Set up;Sitting/lateral leans  Lower Body Dressing Details (indicate cue type and reason) decreased balance for standing LB dressing  Toilet Transfer Supervision/safety;Ambulation (decreased balance)  Toileting- Clothing Manipulation and Hygiene Supervision/safety;Sit to/from stand  Toileting - Clothing Manipulation Details (indicate cue type and reason) urination  Functional mobility during ADLs Contact guard assist (pushing IV pole)  General ADL Comments decreased activity tolerance, DOE 2/4 with slight increase in mobility/activity  Bed Mobility  Overal bed mobility Needs Assistance  Bed Mobility Supine to Sit  Supine to sit Supervision  General bed mobility comments supervision for line management and extra time/effort required. HOB elevated and use of rail to assist with trunk elevation  Transfers  Overall transfer level Needs assistance  Equipment used  (pushed IV pole)  Transfers Sit to/from Stand  Sit to Stand Supervision  General transfer comment endorses dizziness, fear of "not being able to breathe"  Balance  Overall balance assessment Mild deficits observed, not formally tested  General Comments  General comments (skin integrity, edema, etc.) SpO2/HR initially 95/93, after in room mobility for toileting 90/100.  OT - End of Session  Equipment Utilized During Treatment Gait belt  Activity Tolerance Patient tolerated treatment well  Patient left Other (comment) (about to ambulate in hallway with PT)  Nurse Communication Mobility status;Other (comment) (check IV (bleeding))  OT Assessment  OT Recommendation/Assessment Patient needs continued OT Services  OT Visit Diagnosis Unsteadiness on feet (R26.81);History of falling (Z91.81);Muscle weakness (generalized) (M62.81);Other (comment) (cardiopulmonary status limiting activity)  OT Problem List Decreased strength;Decreased  activity tolerance;Impaired balance (sitting and/or standing);Decreased knowledge of use of DME or AE;Cardiopulmonary status limiting activity;Increased edema  OT Plan  OT Frequency (ACUTE ONLY) Min 1X/week  OT Treatment/Interventions (ACUTE ONLY) Self-care/ADL training;Energy conservation;DME and/or AE instruction;Therapeutic activities;Patient/family education;Balance training  AM-PAC OT "6 Clicks" Daily Activity Outcome Measure (Version 2)  Help from another person eating meals? 4  Help from another person taking care of personal grooming? 3  Help from another person toileting, which includes using toliet, bedpan, or urinal? 3  Help from another person bathing (including washing, rinsing, drying)? 3  Help from another person to put on and taking off regular upper body clothing? 3  Help from another person to put on and taking off regular lower body clothing? 3  6 Click Score 19  Progressive Mobility  What is the highest level of mobility based on the progressive mobility assessment? Level 5 (Walks with assist in room/hall) - Balance while stepping forward/back and can walk in room with assist - Complete  Mobility Referral Yes  Activity Ambulated with assistance in room  OT Recommendation  Recommendations for Other Services PT consult  Follow Up Recommendations Skilled nursing-short term rehab (<3 hours/day)  Patient can return home with the following A little help with walking and/or transfers;A little help with bathing/dressing/bathroom;Assistance with cooking/housework;Help with stairs or ramp for entrance  Functional Status Assessent Patient has had a recent decline in their functional status and demonstrates the ability to make significant improvements in function in a reasonable and predictable amount of time.  OT Equipment Other (comment) (defer to next venue of care)  Individuals Consulted  Consulted and Agree with Results and Recommendations Patient  Acute Rehab OT Goals  Patient  Stated Goal breateh easier  OT Goal Formulation With patient  Time For Goal Achievement 03/03/24  Potential to Achieve Goals Good  OT Time Calculation  OT Start Time (ACUTE ONLY) 0943  OT Stop Time (ACUTE ONLY) 0955  OT Time Calculation (min) 12 min  OT General Charges  $OT Visit 1 Visit  OT Evaluation  $OT Eval Low Complexity 1 Low   Nyoka Cowden OTR/L Acute Rehabilitation Services Office: [phone]

## 2024-02-18 NOTE — Progress Notes (Signed)
 Per referral from Ernie Avena, NP, I visited with [name] to assess needs and offer support.  [name] debriefed with me the recent decline in his mobility and worsening prognosis. He voiced his sense of hjopelessness and struggle to find motivation to continue living. Notably, landlord/property mgr and those in his immediate community have not been supportive.  I held space for [name] to emote and process what he is experiencing. I provided normalization of emotions and invited some reframing, noting the past strength he has demonstrated in becoming sober as evidence of resilience. I utilized music and built relationship to help affirm the value of his presence and ways to locate hope.  We made a plan to f/u when I am back at [facility] on 3/5.   Lynnette Pote L. Sophronia Simas, M.Div [phone]

## 2024-02-18 NOTE — Evaluation (Signed)
 Physical Therapy Evaluation Patient Details Name: Ryan Long MRN: [id number] DOB: Sep 21, 1961 Today's Date: 02/18/2024  History of Present Illness  63 y.o. male Patient presents to the ED complaining of chest pain and shortness of breath, SI. Dx of HFrEF. Pt with medical history significant of chronic HFrEF/end-stage cardiomyopathy status post AICD, NSVT, HIV, hypertension, hyperlipidemia, COPD, anxiety, depression, bipolar disorder, substance abuse, BPH, CKD stage IIIa.  Patient was recently admitted by cardiology service 2/5-2/21 for worsening heart failure symptoms and VT storm.  Palliative care was consulted and patient was made DNR/DNI.  Patient had decided to pursue hospice and his ICD was inactivated.  He was discharged to group home with hospice.  Clinical Impression  Pt admitted with above diagnosis. Pt ambulated 50' x 4 with standing rest breaks 2* dyspnea. Ongoing verbal cues for pursed lip breathing. SpO2 90-94% on room air walking, no loss of balance.  Pt currently with functional limitations due to the deficits listed below (see PT Problem List). Pt will benefit from acute skilled PT to increase their independence and safety with mobility to allow discharge.           If plan is discharge home, recommend the following: Assistance with cooking/housework   Can travel by private vehicle   Yes    Engineer, manufacturing (4 wheels)  Recommendations for Other Services       Functional Status Assessment Patient has had a recent decline in their functional status and demonstrates the ability to make significant improvements in function in a reasonable and predictable amount of time.     Precautions / Restrictions Precautions Precautions: Fall Recall of Precautions/Restrictions: Intact Precaution/Restrictions Comments: monitor O2 Restrictions Weight Bearing Restrictions Per Provider Order: No      Mobility  Bed Mobility               General bed  mobility comments: up in room with OT    Transfers                   General transfer comment: up in room with OT    Ambulation/Gait Ambulation/Gait assistance: Supervision Gait Distance (Feet): 50 Feet Assistive device: None Gait Pattern/deviations: Step-through pattern, Decreased stride length Gait velocity: Decreased     General Gait Details: 50' x 4 with standing rest breaks 2* dyspnea, SpO2 90-94% on room air walking, verbal cues for pursed lip breathing, 95% on room air at rest, no loss of balance  Stairs            Wheelchair Mobility     Tilt Bed    Modified Rankin (Stroke Patients Only)       Balance Overall balance assessment: No apparent balance deficits (not formally assessed)                                           Pertinent Vitals/Pain Pain Assessment Pain Assessment: No/denies pain Faces Pain Scale: No hurt    Home Living Family/patient expects to be discharged to:: Skilled nursing facility ("[facility]" in which pt rents a room) Living Arrangements: Alone   Type of Home: House Home Access: Stairs to enter Entrance Stairs-Rails: Can reach both (at either entrance) Entrance Stairs-Number of Steps: 5-6 (in back, which is the entrance he typically uses; x3-4 in front)   Home Layout: Laundry or work area in basement;Two level (laundry in basement, which is the  second level) Home Equipment: None Additional Comments: pt rents a room in this "[facility]" and shares a bathroom with other residents    Prior Function Prior Level of Function : Independent/Modified Independent;Driving             Mobility Comments: No AD; fell 1x on October 1st 2024 due to syncope episode, only walking short distances 2* dyspnea ADLs Comments: Drives a "gas scooter"     Extremity/Trunk Assessment   Upper Extremity Assessment Upper Extremity Assessment: Defer to OT evaluation    Lower Extremity Assessment Lower Extremity  Assessment: Overall WFL for tasks assessed    Cervical / Trunk Assessment Cervical / Trunk Assessment: Normal  Communication   Communication Communication: No apparent difficulties    Cognition Arousal: Alert Behavior During Therapy: WFL for tasks assessed/performed   PT - Cognitive impairments: No apparent impairments                         Following commands: Intact       Cueing Cueing Techniques: Verbal cues, Tactile cues     General Comments      Exercises     Assessment/Plan    PT Assessment Patient needs continued PT services  PT Problem List Decreased activity tolerance;Cardiopulmonary status limiting activity       PT Treatment Interventions DME instruction;Gait training;Stair training;Functional mobility training;Therapeutic activities;Therapeutic exercise;Balance training;Neuromuscular re-education;Patient/family education    PT Goals (Current goals can be found in the Care Plan section)  Acute Rehab PT Goals Patient Stated Goal: to be able to walk farther PT Goal Formulation: With patient Time For Goal Achievement: 03/03/24 Potential to Achieve Goals: Good    Frequency Min 1X/week     Co-evaluation               AM-PAC PT "6 Clicks" Mobility  Outcome Measure Help needed turning from your back to your side while in a flat bed without using bedrails?: None Help needed moving from lying on your back to sitting on the side of a flat bed without using bedrails?: None Help needed moving to and from a bed to a chair (including a wheelchair)?: None Help needed standing up from a chair using your arms (e.g., wheelchair or bedside chair)?: None Help needed to walk in hospital room?: None Help needed climbing 3-5 steps with a railing? : None 6 Click Score: 24    End of Session Equipment Utilized During Treatment: Gait belt Activity Tolerance: Patient limited by fatigue Patient left: in chair;with call bell/phone within reach Nurse  Communication: Mobility status PT Visit Diagnosis: Other abnormalities of gait and mobility (R26.89);Muscle weakness (generalized) (M62.81);History of falling (Z91.81)    Time: 1308-6578 PT Time Calculation (min) (ACUTE ONLY): 12 min   Charges:   PT Evaluation $PT Eval Low Complexity: 1 Low   PT General Charges $$ ACUTE PT VISIT: 1 Visit        Tamala Ser PT 02/18/2024  Acute Rehabilitation Services  Office [phone]

## 2024-02-18 NOTE — TOC Progression Note (Addendum)
 Transition of Care [facility]) - Progression Note    Patient Details  Name: Ryan Long MRN: [id number] Date of Birth: Jun 24, 1961  [facility] CM/SW Contact  Shain Pauwels, Olegario Messier, RN Phone Number: 02/18/2024, 12:00 PM  Clinical Narrative: Patient in agreement to [facility] out for [facility], noted palliative care recc otpt palliative care services @ [facility]. Patient also has medicaid.Await bed offers.  Tele sitter must be d/c for [facility] SNF.Noted rw in rm belongs to [employer]-if patient d/c's to [facility] please inform Jermaine w/[employer] since patient may need a rollator instead if appropriate.    Expected Discharge Plan: Skilled Nursing Facility Barriers to Discharge: Continued Medical Work up  Expected Discharge Plan and Services       Living arrangements for the past 2 months: Boarding House ([employer])                                       Social Determinants of Health (SDOH) Interventions SDOH Screenings   Food Insecurity: No Food Insecurity (02/17/2024)  Housing: Low Risk  (02/17/2024)  Transportation Needs: No Transportation Needs (02/17/2024)  Utilities: Not At Risk (02/17/2024)  Alcohol Screen: Low Risk  (07/17/2023)  Depression (PHQ2-9): Low Risk  (08/01/2023)  Financial Resource Strain: Low Risk  (07/17/2023)  Physical Activity: Insufficiently Active (07/17/2023)  Social Connections: Moderately Integrated (07/17/2023)  Stress: Stress Concern Present (07/17/2023)  Tobacco Use: High Risk (02/16/2024)  Health Literacy: Adequate Health Literacy (07/18/2023)    Readmission Risk Interventions     No data to display

## 2024-02-19 DIAGNOSIS — R519 Headache, unspecified: Secondary | ICD-10-CM

## 2024-02-19 DIAGNOSIS — I5023 Acute on chronic systolic (congestive) heart failure: Secondary | ICD-10-CM | POA: Diagnosis not present

## 2024-02-19 DIAGNOSIS — Z7189 Other specified counseling: Secondary | ICD-10-CM | POA: Diagnosis not present

## 2024-02-19 DIAGNOSIS — Z515 Encounter for palliative care: Secondary | ICD-10-CM | POA: Diagnosis not present

## 2024-02-19 LAB — BRAIN NATRIURETIC PEPTIDE: B Natriuretic Peptide: 1168.7 pg/mL — ABNORMAL HIGH (ref 0.0–100.0)

## 2024-02-19 LAB — COMPREHENSIVE METABOLIC PANEL
ALT: 34 U/L (ref 0–44)
AST: 28 U/L (ref 15–41)
Albumin: 2.9 g/dL — ABNORMAL LOW (ref 3.5–5.0)
Alkaline Phosphatase: 61 U/L (ref 38–126)
Anion gap: 10 (ref 5–15)
BUN: 15 mg/dL (ref 8–23)
CO2: 22 mmol/L (ref 22–32)
Calcium: 7.9 mg/dL — ABNORMAL LOW (ref 8.9–10.3)
Chloride: 106 mmol/L (ref 98–111)
Creatinine, Ser: 1.35 mg/dL — ABNORMAL HIGH (ref 0.61–1.24)
GFR, Estimated: 59 mL/min — ABNORMAL LOW (ref >60)
Glucose, Bld: 87 mg/dL (ref 70–99)
Potassium: 3.9 mmol/L (ref 3.5–5.1)
Sodium: 138 mmol/L (ref 135–145)
Total Bilirubin: 1.9 mg/dL — ABNORMAL HIGH (ref 0.0–1.2)
Total Protein: 5.2 g/dL — ABNORMAL LOW (ref 6.5–8.1)

## 2024-02-19 LAB — CBC
HCT: 48.6 % (ref 39.0–52.0)
Hemoglobin: 16.8 g/dL (ref 13.0–17.0)
MCH: 33.4 pg (ref 26.0–34.0)
MCHC: 34.6 g/dL (ref 30.0–36.0)
MCV: 96.6 fL (ref 80.0–100.0)
Platelets: 158 10*3/uL (ref 150–400)
RBC: 5.03 MIL/uL (ref 4.22–5.81)
RDW: 15 % (ref 11.5–15.5)
WBC: 3 10*3/uL — ABNORMAL LOW (ref 4.0–10.5)
nRBC: 0 % (ref 0.0–0.2)

## 2024-02-19 LAB — MAGNESIUM: Magnesium: 2.1 mg/dL (ref 1.7–2.4)

## 2024-02-19 MED ORDER — HEPARIN SODIUM (PORCINE) 5000 UNIT/ML IJ SOLN
5000.0000 [IU] | Freq: Three times a day (TID) | INTRAMUSCULAR | Status: DC
  Administered 2024-02-19 – 2024-03-07 (×48): 5000 [IU] via SUBCUTANEOUS
  Filled 2024-02-19 (×50): qty 1

## 2024-02-19 MED ORDER — KETOROLAC TROMETHAMINE 15 MG/ML IJ SOLN
15.0000 mg | Freq: Once | INTRAMUSCULAR | Status: AC
  Administered 2024-02-19: 15 mg via INTRAVENOUS
  Filled 2024-02-19: qty 1

## 2024-02-19 NOTE — Progress Notes (Signed)
 PROGRESS NOTE    Ryan Long  ZOX:[id number] DOB: 1961-07-15 DOA: 02/16/2024 PCP: Cristino Martes, NP    Brief Narrative:   Ryan Long is a 63 y.o. male with past medical history significant for chronic HFrEF/end-stage cardiomyopathy status post AICD, NSVT, HIV, hypertension, hyperlipidemia, COPD, anxiety, depression, bipolar disorder, substance abuse, BPH, CKD stage IIIa who presented to [facility] ED on 02/16/2024 with complaints of chest pain, shortness of breath. Patient states since he left the hospital last time he has continued to have shortness of breath, heart palpitations, lightheadedness, and substernal chest pain. Symptoms have been worse for the past few days prompting him to come into the ED to be evaluated. He is taking his home medications as prescribed. Patient states he was previously advised to go to a skilled nursing facility and had declined but now regrets his decision. He is tearful and concerned that his advanced heart failure is affecting the quality of his life. Patient mentions having thoughts of walking to the street and jumping in front of a car to end his life and states he has not been able to do so as he is barely able to walk due to shortness of breath.   In the ED, Temperature 97.8 F, pulse 115, respiratory rate 21, blood pressure 123/94, and SpO2 100% on room air. Labs showing WBC count 2.7 (was 3.4 on labs 3 weeks ago), hemoglobin 18.1 (previously elevated as well and stable), creatinine 1.7 (was 1.4 on labs 2 weeks ago), BNP 1959, troponin negative, lactic acid normal, blood cultures collected, UDS pending. Chest x-ray showing no active cardiopulmonary disease. ED physician discussed the case with on-call cardiologist Dr. Orson Aloe who recommended admitting the patient to [facility] by hospitalist service and requested that cardiology team is consulted during daytime. Patient was given IV Lasix 40 mg. TRH called to admit.   Assessment &  Plan:   Acute on chronic HFrEF/end-stage nonischemic cardiomyopathy TTE October 2024 showing EF 20 to 25%.  Patient was recently admitted 2/5-2/21 for worsening heart failure symptoms and VT storm.  Palliative care was consulted and patient had decided to pursue hospice so his ICD was inactivated.  He is now presenting with worsening dyspnea on exertion and lower extremity edema. BNP elevated at 1959.  Chest x-ray not suggestive of pulmonary edema.  -- Cardiology following, appreciate assistance -- BNP [phone] -- net negative 1.1L past 24h and net negative 4.9L since admission -- wt 86.6>>79.8Kg -- Lasix 40 mg IV every 12 hours -- Spironolactone 25 mg p.o. daily -- Jardiance 10 mg p.o. daily -- Midodrine 10 mg p.o. 3 times daily for blood pressure support -- 1200 mL fluid restriction -- Strict I's and O's and daily weights -- BMP, BNP daily   Chest pain/elevated D-dimer Troponin negative and EKG without acute ischemic changes.  Recent cath done in January showing normal coronaries. UDS negative.  Vascular duplex ultrasound bilateral lower extremities negative for DVT.  Nuclear medicine VQ scan negative for pulmonary embolism.   AKI on CKD stage IIIa Likely cardiorenal in etiology.  Creatinine was 1.4 on labs 2 weeks ago and now increased to 1.7 on admission; likely secondary to decompensated CHF as above. -- Cr 1.71>1.49>1.46>1.35 -- Lasix as above -- BMP daily  Hypokalemia Repleted, potassium 3.9 this morning -- Repeat electrolytes in a.m.   Suicidal ideation History of anxiety, depression, bipolar disorder Patient is tearful and concerned that his advanced heart failure is affecting the quality of his life.  He mentions  having thoughts of walking to the street and jumping in front of a car to end his life and states he has not been able to do so as he is barely able to walk due to shortness of breath.  He also repeatedly told RN that he is having thoughts of jumping in front  of a car to end his life.   -- Psychiatry following, appreciate assistance -- Clonazepam 0.125 mg p.o. 3 times daily -- Remeron 7.5 g p.o. nightly -- Soil scientist, suicide precautions   History of VT storm Continue amiodarone 200 mg  PO BID and mexiletine 250 mg PO BID. -- Monitor on telemetry   HIV -- continue Biktarvy, Valtrex due to history of recurrent HSV. -- outpatient follow-up with infectious disease   COPD Stable, no wheezing.   -- Breo Ellipta 1 puff daily --Albuterol neb every 6 hours as needed wheezing/shortness of breath   BPH Continue home medications.  Goals of care: Patient recently transition to hospice.  During previous admission treatment team seeking placement [facility] patient declined.  Patient is regretful not seeking placement as he needs a lot of care and now amenable. --Palliative care following, appreciate assistance   DVT prophylaxis: heparin injection 5,000 Units Start: 02/19/24 1400    Code Status: Limited: Do not attempt resuscitation (DNR) -DNR-LIMITED -Do Not Intubate/DNI  Family Communication: No family present at bedside this morning  Disposition Plan:  Level of care: Telemetry Status is: Inpatient Remains inpatient appropriate because: IV diuresis; pending SNF placement      Consultants:  Cardiology Psychiatry Palliative care  Procedures:  Vascular duplex ultrasound bilateral extremities: negative for DVT NM VQ scan: Negative for pulmonary embolism  Antimicrobials:  None   Subjective: Patient seen examined bedside, lying in bed watching TV.  Continues with intermittent chest pain, palpitations, shortness of breath; although much improved with IV diuresis.  Creatinine improved.  Reports his lower extremity edema has also improved.  No other specific complaints, concerns or questions at this time.  Denies headache, no dizziness, no abdominal pain, no cough/congestion, no fever/chills/night sweats, no nausea/vomiting/diarrhea,  no focal weakness, no fatigue, no paresthesia.  Objective: Vitals:   02/19/24 0403 02/19/24 0404 02/19/24 0937 02/19/24 1049  BP:  103/74 95/79   Pulse:  100 100   Resp:  18 19   Temp:  97.9 F (36.6 C) 97.9 F (36.6 C)   TempSrc:  Oral Oral   SpO2:  99% 98% 96%  Weight: 79.8 kg     Height:        Intake/Output Summary (Last 24 hours) at 02/19/2024 1142 Last data filed at 02/19/2024 1610 Gross per 24 hour  Intake 360 ml  Output 2150 ml  Net -1790 ml   Filed Weights   02/17/24 1054 02/18/24 0500 02/19/24 0403  Weight: 82.4 kg 82.4 kg 79.8 kg    Examination:  Physical Exam: GEN: NAD, alert and oriented x 3, chronically ill appearance, appears older than stated age HEENT: NCAT, PERRL, EOMI, sclera clear, MMM PULM: Breath sounds slight diminished bilateral bases, no wheezes/crackles, normal respiratory effort without accessory muscle use, on room air with SpO2 99% at rest CV: RRR w/o M/G/R GI: abd soft, NTND, + BS MSK: + LE peripheral edema, moves all extremities dependently NEURO: No focal neurological deficit PSYCH: Depressed mood, flat affect, + SI, denies HI Integumentary: No concerning rashes/lesions/wounds noted on exposed skin surfaces    Data Reviewed: I have personally reviewed following labs and imaging studies  CBC: Recent  Labs  Lab 02/16/24 1930 02/18/24 0535 02/19/24 0543  WBC 2.7* 2.1* 3.0*  HGB 18.1* 16.8 16.8  HCT 54.3* 50.2 48.6  MCV 100.0 100.2* 96.6  PLT 195 161 158   Basic Metabolic Panel: Recent Labs  Lab 02/16/24 1930 02/17/24 0615 02/18/24 0535 02/19/24 0543  NA 140 139 139 138  K 4.1 3.6 3.3* 3.9  CL 108 109 106 106  CO2 22 19* 22 22  GLUCOSE 88 73 84 87  BUN 18 17 18 15   CREATININE 1.71* 1.49* 1.46* 1.35*  CALCIUM 8.6* 7.9* 7.7* 7.9*  MG  --   --  2.1 2.1   GFR: Estimated Creatinine Clearance: 54.9 mL/min (A) (by C-G formula based on SCr of 1.35 mg/dL (H)). Liver Function Tests: Recent Labs  Lab 02/17/24 0615  02/18/24 0535 02/19/24 0543  AST 37 26 28  ALT 45* 40 34  ALKPHOS 65 61 61  BILITOT 2.2* 1.6* 1.9*  PROT 5.6* 4.8* 5.2*  ALBUMIN 2.9* 2.6* 2.9*   No results for input(s): "LIPASE", "AMYLASE" in the last 168 hours. No results for input(s): "AMMONIA" in the last 168 hours. Coagulation Profile: Recent Labs  Lab 02/17/24 1027  INR 1.2   Cardiac Enzymes: No results for input(s): "CKTOTAL", "CKMB", "CKMBINDEX", "TROPONINI" in the last 168 hours. BNP (last 3 results) No results for input(s): "PROBNP" in the last 8760 hours. HbA1C: No results for input(s): "HGBA1C" in the last 72 hours. CBG: Recent Labs  Lab 02/18/24 1746  GLUCAP 110*   Lipid Profile: No results for input(s): "CHOL", "HDL", "LDLCALC", "TRIG", "CHOLHDL", "LDLDIRECT" in the last 72 hours. Thyroid Function Tests: No results for input(s): "TSH", "T4TOTAL", "FREET4", "T3FREE", "THYROIDAB" in the last 72 hours. Anemia Panel: No results for input(s): "VITAMINB12", "FOLATE", "FERRITIN", "TIBC", "IRON", "RETICCTPCT" in the last 72 hours. Sepsis Labs: Recent Labs  Lab 02/16/24 2049  LATICACIDVEN 1.4    Recent Results (from the past 240 hours)  Culture, blood (Routine x 2)     Status: None (Preliminary result)   Collection Time: 02/16/24  8:40 PM   Specimen: BLOOD  Result Value Ref Range Status   Specimen Description   Final    BLOOD RIGHT ANTECUBITAL Performed at [facility], [address] [place], [place] [address]    Special Requests   Final    BOTTLES DRAWN AEROBIC AND ANAEROBIC Blood Culture adequate volume Performed at [facility], [address] [place], [place] [address]    Culture   Final    NO GROWTH 2 DAYS Performed at [facility], [address] [place], [place] [address]    Report Status PENDING  Incomplete  Culture, blood (Routine x 2)     Status: None (Preliminary result)   Collection Time: 02/16/24  9:00 PM   Specimen: BLOOD  Result Value  Ref Range Status   Specimen Description   Final    BLOOD LEFT ANTECUBITAL Performed at [facility], [address] [place], [place] [address]    Special Requests   Final    BOTTLES DRAWN AEROBIC AND ANAEROBIC Blood Culture adequate volume Performed at [facility], [address] [place], [place] [address]    Culture   Final    NO GROWTH 2 DAYS Performed at [facility], [address] [place], [place] [address]    Report Status PENDING  Incomplete         Radiology Studies: NM Pulmonary Perfusion Result Date:  02/18/2024 CLINICAL DATA:  Chest pain and shortness of breath. Elevated D-dimer. Renal insufficiency. EXAM: NUCLEAR MEDICINE PERFUSION LUNG SCAN TECHNIQUE: Perfusion images were obtained in multiple projections after intravenous injection of radiopharmaceutical. Ventilation scans intentionally deferred if perfusion scan and chest x-ray adequate for interpretation during COVID 19 epidemic. RADIOPHARMACEUTICALS:  4.2 mCi Tc-67m MAA IV COMPARISON:  Chest x-ray on 02/16/2024 FINDINGS: No significant subsegmental or segmental perfusion defects in the lungs bilaterally. IMPRESSION: No evidence to suggest pulmonary embolism by nuclear medicine perfusion imaging. Electronically Signed   By: Irish Lack M.D.   On: 02/18/2024 14:54   VAS [place] LOWER EXTREMITY VENOUS (DVT) Result Date: 02/17/2024  Lower Venous DVT Study Patient Name:  LINKYN GOBIN  Date of Exam:   02/17/2024 Medical Rec #: [id number]              Accession #:    [id number] Date of Birth: [birth year]              Patient Gender: M Patient Age:   85 years Exam Location:  [facility] Procedure:      VAS [place] LOWER EXTREMITY VENOUS (DVT) Referring Phys: Matison Nuccio [place] --------------------------------------------------------------------------------  Indications: Edema, SOB, and Chest pain, HFrEF, s/p AICD 2019.  Comparison Study: No prior exam. Performing Technologist: Fernande Bras  Examination Guidelines: A complete evaluation includes B-mode imaging, spectral Doppler, color Doppler, and power Doppler as needed of all accessible portions of each vessel. Bilateral testing is considered an integral part of a complete examination. Limited examinations for reoccurring indications may be performed as noted. The reflux portion of the exam is performed with the patient in reverse Trendelenburg.  +---------+---------------+---------+-----------+----------+--------------+ RIGHT    CompressibilityPhasicitySpontaneityPropertiesThrombus Aging +---------+---------------+---------+-----------+----------+--------------+ CFV      Full           Yes      Yes                                 +---------+---------------+---------+-----------+----------+--------------+ SFJ      Full           Yes      Yes                                 +---------+---------------+---------+-----------+----------+--------------+ FV Prox  Full                                                        +---------+---------------+---------+-----------+----------+--------------+ FV Mid   Full                                                        +---------+---------------+---------+-----------+----------+--------------+ FV DistalFull                                                        +---------+---------------+---------+-----------+----------+--------------+ PFV      Full                                                        +---------+---------------+---------+-----------+----------+--------------+  POP      Full           Yes      Yes                                 +---------+---------------+---------+-----------+----------+--------------+ PTV      Full                                                        +---------+---------------+---------+-----------+----------+--------------+ PERO     Full                                                         +---------+---------------+---------+-----------+----------+--------------+   +---------+---------------+---------+-----------+----------+--------------+ LEFT     CompressibilityPhasicitySpontaneityPropertiesThrombus Aging +---------+---------------+---------+-----------+----------+--------------+ CFV      Full           Yes      Yes                                 +---------+---------------+---------+-----------+----------+--------------+ SFJ      Full           Yes      Yes                                 +---------+---------------+---------+-----------+----------+--------------+ FV Prox  Full                                                        +---------+---------------+---------+-----------+----------+--------------+ FV Mid   Full                                                        +---------+---------------+---------+-----------+----------+--------------+ FV DistalFull                                                        +---------+---------------+---------+-----------+----------+--------------+ PFV      Full                                                        +---------+---------------+---------+-----------+----------+--------------+ POP      Full           Yes      Yes                                 +---------+---------------+---------+-----------+----------+--------------+  PTV      Full                                                        +---------+---------------+---------+-----------+----------+--------------+ PERO     Full                                                        +---------+---------------+---------+-----------+----------+--------------+     Summary: BILATERAL: - No evidence of deep vein thrombosis seen in the lower extremities, bilaterally. -No evidence of popliteal cyst, bilaterally.   *See table(s) above for measurements and observations. Electronically signed by Coral Else MD on 02/17/2024 at 9:27:36  PM.    Final         Scheduled Meds:  amiodarone  200 mg Oral BID   aspirin EC  81 mg Oral Daily   bictegravir-emtricitabine-tenofovir AF  1 tablet Oral Daily   clonazepam  0.125 mg Oral TID   dronabinol  5 mg Oral BID AC   empagliflozin  10 mg Oral QAC breakfast   finasteride  5 mg Oral Daily   fluticasone furoate-vilanterol  1 puff Inhalation Daily   furosemide  40 mg Intravenous BID   heparin injection (subcutaneous)  5,000 Units Subcutaneous Q8H   mexiletine  250 mg Oral Q12H   midodrine  10 mg Oral TID WC   mirtazapine  7.5 mg Oral QHS   polyethylene glycol  17 g Oral Daily   spironolactone  25 mg Oral Daily   tamsulosin  0.4 mg Oral BID   valACYclovir  500 mg Oral Daily   Continuous Infusions:     LOS: 1 day    Time spent: 45 minutes spent on chart review, discussion with nursing staff, consultants, updating family and interview/physical exam; more than 50% of that time was spent in counseling and/or coordination of care.    Alvira Philips [place], DO [facility] Available via Epic secure chat 7am-7pm After these hours, please refer to coverage provider listed on [email or web address] 02/19/2024, 11:42 AM

## 2024-02-19 NOTE — TOC Progression Note (Signed)
 Transition of Care [facility]) - Progression Note    Patient Details  Name: Ryan Long MRN: [id number] Date of Birth: 1961-10-22  Transition of [facility]) CM/SW Contact  Lashawn Orrego, Olegario Messier, RN Phone Number: 02/19/2024, 2:34 PM  Clinical Narrative:  await bed choice prior auth.     Expected Discharge Plan: Skilled Nursing Facility Barriers to Discharge: Continued Medical Work up  Expected Discharge Plan and Services       Living arrangements for the past 2 months: Boarding House ([employer])                                       Social Determinants of Health (SDOH) Interventions SDOH Screenings   Food Insecurity: No Food Insecurity (02/17/2024)  Housing: Low Risk  (02/17/2024)  Transportation Needs: No Transportation Needs (02/17/2024)  Utilities: Not At Risk (02/17/2024)  Alcohol Screen: Low Risk  (07/17/2023)  Depression (PHQ2-9): Low Risk  (08/01/2023)  Financial Resource Strain: Low Risk  (07/17/2023)  Physical Activity: Insufficiently Active (07/17/2023)  Social Connections: Moderately Integrated (07/17/2023)  Stress: Stress Concern Present (07/17/2023)  Tobacco Use: High Risk (02/16/2024)  Health Literacy: Adequate Health Literacy (07/18/2023)    Readmission Risk Interventions     No data to display
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
## 2024-02-19 NOTE — Progress Notes (Signed)
   Palliative Medicine Inpatient Follow Up Note HPI: Ryan Long is a 63 y.o. male with medical history significant of chronic HFrEF/end-stage cardiomyopathy status post AICD, NSVT, HIV, hypertension, hyperlipidemia, COPD, anxiety, depression, bipolar disorder, substance abuse, BPH, CKD stage IIIa.     Patient was discharged from [facility] and readmitted with heart failure exacerbation. The PMT has been asked to support additional goals of care conversations.   Today's Discussion 02/19/2024  *Please note that this is a verbal dictation therefore any spelling or grammatical errors are due to the "[employer] One" system interpretation.  Chart reviewed inclusive of vital signs, progress notes, laboratory results, and diagnostic images.   I met with Ryan Long this morning at bedside. He is awake and alert. He shares that he has a bit of head congestion and overall is feeling weak. He shares that he is thirsty and requests ginger ale which I was able to get and provide him.   Ryan Long states that he feels safer in the hospital environment. We reviewed the plan for him to transition to skilled nursing once he is medically optimized.   Plan for the time being is continue present interventions.   Questions and concerns addressed/Palliative Support Provided.   Objective Assessment: Vital Signs Vitals:   02/19/24 0404 02/19/24 0937  BP: 103/74 95/79  Pulse: 100 100  Resp: 18 19  Temp: 97.9 F (36.6 C) 97.9 F (36.6 C)  SpO2: 99% 98%    Intake/Output Summary (Last 24 hours) at 02/19/2024 1034 Last data filed at 02/19/2024 1610 Gross per 24 hour  Intake 360 ml  Output 2150 ml  Net -1790 ml   Last Weight  Most recent update: 02/19/2024  4:03 AM    Weight  79.8 kg (175 lb 14.8 oz)            Gen: Older African-American male in no acute distress HEENT: moist mucous membranes CV: Regular rate and rhythm PULM: On RA,  breathing is even and nonlabored ABD: soft/nontender EXT: (+) BLE  edema Neuro: Alert and oriented x3  SUMMARY OF RECOMMENDATIONS   DNAR/DNI   Ryan Long is agreeable to placement with OP Palliative and eventual hospice support  Ongoing palliative care support  Billing based on MDM: Moderate ______________________________________________________________________________________ Ryan Long  Palliative Medicine Team Team Cell Phone: [phone] Please utilize secure chat with additional questions, if there is no response within 30 minutes please call the above phone number  Palliative Medicine Team providers are available by phone from 7am to 7pm daily and can be reached through the team cell phone.  Should this patient require assistance outside of these hours, please call the patient's attending physician.

## 2024-02-20 DIAGNOSIS — I5023 Acute on chronic systolic (congestive) heart failure: Secondary | ICD-10-CM | POA: Diagnosis not present

## 2024-02-20 DIAGNOSIS — Z515 Encounter for palliative care: Secondary | ICD-10-CM | POA: Diagnosis not present

## 2024-02-20 DIAGNOSIS — R519 Headache, unspecified: Secondary | ICD-10-CM | POA: Diagnosis not present

## 2024-02-20 LAB — MAGNESIUM: Magnesium: 2 mg/dL (ref 1.7–2.4)

## 2024-02-20 LAB — COMPREHENSIVE METABOLIC PANEL
ALT: 29 U/L (ref 0–44)
AST: 20 U/L (ref 15–41)
Albumin: 2.7 g/dL — ABNORMAL LOW (ref 3.5–5.0)
Alkaline Phosphatase: 61 U/L (ref 38–126)
Anion gap: 10 (ref 5–15)
BUN: 16 mg/dL (ref 8–23)
CO2: 25 mmol/L (ref 22–32)
Calcium: 7.9 mg/dL — ABNORMAL LOW (ref 8.9–10.3)
Chloride: 103 mmol/L (ref 98–111)
Creatinine, Ser: 1.64 mg/dL — ABNORMAL HIGH (ref 0.61–1.24)
GFR, Estimated: 47 mL/min — ABNORMAL LOW (ref >60)
Glucose, Bld: 106 mg/dL — ABNORMAL HIGH (ref 70–99)
Potassium: 3.5 mmol/L (ref 3.5–5.1)
Sodium: 138 mmol/L (ref 135–145)
Total Bilirubin: 1.1 mg/dL (ref 0.0–1.2)
Total Protein: 5.2 g/dL — ABNORMAL LOW (ref 6.5–8.1)

## 2024-02-20 LAB — BRAIN NATRIURETIC PEPTIDE: B Natriuretic Peptide: 1108.7 pg/mL — ABNORMAL HIGH (ref 0.0–100.0)

## 2024-02-20 MED ORDER — POTASSIUM CHLORIDE CRYS ER 20 MEQ PO TBCR
30.0000 meq | EXTENDED_RELEASE_TABLET | ORAL | Status: AC
  Administered 2024-02-20 (×2): 30 meq via ORAL
  Filled 2024-02-20 (×2): qty 1

## 2024-02-20 MED ORDER — TRAZODONE HCL 50 MG PO TABS
50.0000 mg | ORAL_TABLET | Freq: Every evening | ORAL | Status: DC | PRN
  Administered 2024-02-21 – 2024-03-04 (×10): 50 mg via ORAL
  Filled 2024-02-20 (×11): qty 1

## 2024-02-20 MED ORDER — MORPHINE SULFATE (PF) 2 MG/ML IV SOLN
1.0000 mg | INTRAVENOUS | Status: DC | PRN
  Administered 2024-02-25: 1 mg via INTRAVENOUS
  Filled 2024-02-20 (×2): qty 1

## 2024-02-20 MED ORDER — MIRTAZAPINE 15 MG PO TABS
15.0000 mg | ORAL_TABLET | Freq: Every day | ORAL | Status: DC
  Administered 2024-02-20 – 2024-03-06 (×16): 15 mg via ORAL
  Filled 2024-02-20 (×16): qty 1

## 2024-02-20 MED ORDER — OXYCODONE HCL 5 MG PO TABS
5.0000 mg | ORAL_TABLET | ORAL | Status: DC | PRN
  Administered 2024-02-20 – 2024-03-06 (×20): 5 mg via ORAL
  Filled 2024-02-20 (×21): qty 1

## 2024-02-20 NOTE — Progress Notes (Signed)
 PROGRESS NOTE    [name]  JYN:[id number] DOB: [birth year] DOA: 02/16/2024 PCP: Cristino Martes, NP    Brief Narrative:   Ryan Long is a 63 y.o. male with past medical history significant for chronic HFrEF/end-stage cardiomyopathy status post AICD, NSVT, HIV, hypertension, hyperlipidemia, COPD, anxiety, depression, bipolar disorder, substance abuse, BPH, CKD stage IIIa who presented to [facility] ED on 02/16/2024 with complaints of chest pain, shortness of breath. Patient states since he left the hospital last time he has continued to have shortness of breath, heart palpitations, lightheadedness, and substernal chest pain. Symptoms have been worse for the past few days prompting him to come into the ED to be evaluated. He is taking his home medications as prescribed. Patient states he was previously advised to go to a skilled nursing facility and had declined but now regrets his decision. He is tearful and concerned that his advanced heart failure is affecting the quality of his life. Patient mentions having thoughts of walking to the street and jumping in front of a car to end his life and states he has not been able to do so as he is barely able to walk due to shortness of breath.   In the ED, Temperature 97.8 F, pulse 115, respiratory rate 21, blood pressure 123/94, and SpO2 100% on room air. Labs showing WBC count 2.7 (was 3.4 on labs 3 weeks ago), hemoglobin 18.1 (previously elevated as well and stable), creatinine 1.7 (was 1.4 on labs 2 weeks ago), BNP 1959, troponin negative, lactic acid normal, blood cultures collected, UDS pending. Chest x-ray showing no active cardiopulmonary disease. ED physician discussed the case with on-call cardiologist Dr. Orson Aloe who recommended admitting the patient to [facility] by hospitalist service and requested that cardiology team is consulted during daytime. Patient was given IV Lasix 40 mg. TRH called to admit.   Assessment &  Plan:   Acute on chronic HFrEF/end-stage nonischemic cardiomyopathy TTE October 2024 showing EF 20 to 25%.  Patient was recently admitted 2/5-2/21 for worsening heart failure symptoms and VT storm.  Palliative care was consulted and patient had decided to pursue hospice so his ICD was inactivated.  He is now presenting with worsening dyspnea on exertion and lower extremity edema. BNP elevated at 1959.  Chest x-ray not suggestive of pulmonary edema.  -- Cardiology following, appreciate assistance -- BNP 1959>1333>1168>1108 -- net negative 4.7L since admission; with 2 unmeasured occurrences past 24 hours -- wt 86.6>>79.8>79.7Kg -- Lasix 40 mg IV every 12 hours -- Spironolactone 25 mg p.o. daily -- Jardiance 10 mg p.o. daily -- Midodrine 10 mg p.o. 3 times daily for blood pressure support -- 1200 mL fluid restriction -- Strict I's and O's and daily weights -- BMP, BNP daily   Chest pain/elevated D-dimer Troponin negative and EKG without acute ischemic changes.  Recent cath done in January showing normal coronaries. UDS negative.  Vascular duplex ultrasound bilateral lower extremities negative for DVT.  Nuclear medicine VQ scan negative for pulmonary embolism.   AKI on CKD stage IIIa Likely cardiorenal in etiology.  Creatinine was 1.4 on labs 2 weeks ago and now increased to 1.7 on admission; likely secondary to decompensated CHF as above. -- Cr 1.71>1.49>1.46>1.35>1.64 -- Lasix as above -- BMP daily  Hypokalemia Repleted, potassium 3.9 this morning -- Repeat electrolytes in a.m.   Suicidal ideation History of anxiety, depression, bipolar disorder Patient is tearful and concerned that his advanced heart failure is affecting the quality of his life.  He  mentions having thoughts of walking to the street and jumping in front of a car to end his life and states he has not been able to do so as he is barely able to walk due to shortness of breath.  He also repeatedly told RN that he is having  thoughts of jumping in front of a car to end his life.   -- Psychiatry following, appreciate assistance -- Clonazepam 0.125 mg p.o. 3 times daily -- Remeron 7.5 g p.o. nightly -- Soil scientist, suicide precautions   History of VT storm Continue amiodarone 200 mg  PO BID and mexiletine 250 mg PO BID. -- Monitor on telemetry   HIV -- continue Biktarvy, Valtrex due to history of recurrent HSV. -- outpatient follow-up with infectious disease   COPD Stable, no wheezing.   -- Breo Ellipta 1 puff daily --Albuterol neb every 6 hours as needed wheezing/shortness of breath   BPH Continue home medications.  Goals of care: Patient recently transition to hospice.  During previous admission treatment team seeking placement [facility] patient declined.  Patient is regretful not seeking placement as he needs a lot of care and now amenable. -- Palliative care following, appreciate assistance   DVT prophylaxis: heparin injection 5,000 Units Start: 02/19/24 1400    Code Status: Limited: Do not attempt resuscitation (DNR) -DNR-LIMITED -Do Not Intubate/DNI  Family Communication: No family present at bedside this morning  Disposition Plan:  Level of care: Telemetry Status is: Inpatient Remains inpatient appropriate because: IV diuresis; pending SNF placement   Consultants:  Cardiology Psychiatry Palliative care  Procedures:  Vascular duplex ultrasound bilateral extremities: negative for DVT NM VQ scan: Negative for pulmonary embolism  Antimicrobials:  None   Subjective: Patient seen examined bedside, lying in bed.  Does continue to have intermittent thoughts regarding suicide, states has thoughts about "running into traffic to get hit by a truck".  Continues on IV diuresis with 14 pound weight loss since admission.  Denies chest pain or palpitations morning, shortness of breath improved. No other specific complaints, concerns or questions at this time.  Denies headache, no dizziness, no  abdominal pain, no cough/congestion, no fever/chills/night sweats, no nausea/vomiting/diarrhea, no focal weakness, no fatigue, no paresthesia.  Objective: Vitals:   02/19/24 2313 02/20/24 0430 02/20/24 0500 02/20/24 0911  BP: (!) 83/73 91/69    Pulse: 95 100    Resp: 20     Temp: 97.7 F (36.5 C) (!) 97.1 F (36.2 C)    TempSrc: Oral Oral    SpO2: 96% 96%  95%  Weight:   79.7 kg   Height:        Intake/Output Summary (Last 24 hours) at 02/20/2024 1030 Last data filed at 02/20/2024 8657 Gross per 24 hour  Intake 703 ml  Output 325 ml  Net 378 ml   Filed Weights   02/18/24 0500 02/19/24 0403 02/20/24 0500  Weight: 82.4 kg 79.8 kg 79.7 kg    Examination:  Physical Exam: GEN: NAD, alert and oriented x 3, chronically ill appearance, appears older than stated age HEENT: NCAT, PERRL, EOMI, sclera clear, MMM PULM: Breath sounds slight diminished bilateral bases, no wheezes/crackles, normal respiratory effort without accessory muscle use, on room air with SpO2 99% at rest CV: RRR w/o M/G/R GI: abd soft, NTND, + BS MSK: + LE peripheral edema, moves all extremities dependently NEURO: No focal neurological deficit PSYCH: Depressed mood, flat affect, + SI, denies HI Integumentary: No concerning rashes/lesions/wounds noted on exposed skin surfaces  Data Reviewed: I have personally reviewed following labs and imaging studies  CBC: Recent Labs  Lab 02/16/24 1930 02/18/24 0535 02/19/24 0543  WBC 2.7* 2.1* 3.0*  HGB 18.1* 16.8 16.8  HCT 54.3* 50.2 48.6  MCV 100.0 100.2* 96.6  PLT 195 161 158   Basic Metabolic Panel: Recent Labs  Lab 02/16/24 1930 02/17/24 0615 02/18/24 0535 02/19/24 0543 02/20/24 0521  NA 140 139 139 138 138  K 4.1 3.6 3.3* 3.9 3.5  CL 108 109 106 106 103  CO2 22 19* 22 22 25   GLUCOSE 88 73 84 87 106*  BUN 18 17 18 15 16   CREATININE 1.71* 1.49* 1.46* 1.35* 1.64*  CALCIUM 8.6* 7.9* 7.7* 7.9* 7.9*  MG  --   --  2.1 2.1 2.0   GFR: Estimated  Creatinine Clearance: 45.2 mL/min (A) (by C-G formula based on SCr of 1.64 mg/dL (H)). Liver Function Tests: Recent Labs  Lab 02/17/24 0615 02/18/24 0535 02/19/24 0543 02/20/24 0521  AST 37 26 28 20   ALT 45* 40 34 29  ALKPHOS 65 61 61 61  BILITOT 2.2* 1.6* 1.9* 1.1  PROT 5.6* 4.8* 5.2* 5.2*  ALBUMIN 2.9* 2.6* 2.9* 2.7*   No results for input(s): "LIPASE", "AMYLASE" in the last 168 hours. No results for input(s): "AMMONIA" in the last 168 hours. Coagulation Profile: Recent Labs  Lab 02/17/24 1027  INR 1.2   Cardiac Enzymes: No results for input(s): "CKTOTAL", "CKMB", "CKMBINDEX", "TROPONINI" in the last 168 hours. BNP (last 3 results) No results for input(s): "PROBNP" in the last 8760 hours. HbA1C: No results for input(s): "HGBA1C" in the last 72 hours. CBG: Recent Labs  Lab 02/18/24 1746  GLUCAP 110*   Lipid Profile: No results for input(s): "CHOL", "HDL", "LDLCALC", "TRIG", "CHOLHDL", "LDLDIRECT" in the last 72 hours. Thyroid Function Tests: No results for input(s): "TSH", "T4TOTAL", "FREET4", "T3FREE", "THYROIDAB" in the last 72 hours. Anemia Panel: No results for input(s): "VITAMINB12", "FOLATE", "FERRITIN", "TIBC", "IRON", "RETICCTPCT" in the last 72 hours. Sepsis Labs: Recent Labs  Lab 02/16/24 2049  LATICACIDVEN 1.4    Recent Results (from the past 240 hours)  Culture, blood (Routine x 2)     Status: None (Preliminary result)   Collection Time: 02/16/24  8:40 PM   Specimen: BLOOD  Result Value Ref Range Status   Specimen Description   Final    BLOOD RIGHT ANTECUBITAL Performed at [facility], [address], [place], [place] [address]    Special Requests   Final    BOTTLES DRAWN AEROBIC AND ANAEROBIC Blood Culture adequate volume Performed at [facility], [address] [place], [place] [address]    Culture   Final    NO GROWTH 3 DAYS Performed at [facility], [address] [place], [place]  [address]    Report Status PENDING  Incomplete  Culture, blood (Routine x 2)     Status: None (Preliminary result)   Collection Time: 02/16/24  9:00 PM   Specimen: BLOOD  Result Value Ref Range Status   Specimen Description   Final    BLOOD LEFT ANTECUBITAL Performed at [facility], [address] [place], [place] [address]    Special Requests   Final    BOTTLES DRAWN AEROBIC AND ANAEROBIC Blood Culture adequate volume Performed at [facility], [address], [place], [place] [address]    Culture   Final    NO GROWTH 3 DAYS Performed at [facility], [address], [place], [place] [address]    Report Status PENDING  Incomplete         Radiology Studies: NM Pulmonary Perfusion Result Date: 02/18/2024 CLINICAL DATA:  Chest pain and shortness of breath. Elevated D-dimer. Renal insufficiency. EXAM: NUCLEAR MEDICINE PERFUSION LUNG SCAN TECHNIQUE: Perfusion images were obtained in multiple projections after intravenous injection of radiopharmaceutical. Ventilation scans intentionally deferred if perfusion scan and chest x-ray adequate for interpretation during COVID 19 epidemic. RADIOPHARMACEUTICALS:  4.2 mCi Tc-28m MAA IV COMPARISON:  Chest x-ray on 02/16/2024 FINDINGS: No significant subsegmental or segmental perfusion defects in the lungs bilaterally. IMPRESSION: No evidence to suggest pulmonary embolism by nuclear medicine perfusion imaging. Electronically Signed   By: Irish Lack M.D.   On: 02/18/2024 14:54        Scheduled Meds:  amiodarone  200 mg Oral BID   aspirin EC  81 mg Oral Daily   bictegravir-emtricitabine-tenofovir AF  1 tablet Oral Daily   clonazepam  0.125 mg Oral TID   dronabinol  5 mg Oral BID AC   empagliflozin  10 mg Oral QAC breakfast   finasteride  5 mg Oral Daily   fluticasone furoate-vilanterol  1 puff Inhalation Daily   furosemide  40 mg Intravenous BID   heparin injection (subcutaneous)  5,000 Units  Subcutaneous Q8H   mexiletine  250 mg Oral Q12H   midodrine  10 mg Oral TID WC   mirtazapine  7.5 mg Oral QHS   polyethylene glycol  17 g Oral Daily   potassium chloride  30 mEq Oral Q3H   spironolactone  25 mg Oral Daily   tamsulosin  0.4 mg Oral BID   valACYclovir  500 mg Oral Daily   Continuous Infusions:     LOS: 2 days    Time spent: 45 minutes spent on chart review, discussion with nursing staff, consultants, updating family and interview/physical exam; more than 50% of that time was spent in counseling and/or coordination of care.    Alvira Philips [place], DO [facility] Available via Epic secure chat 7am-7pm After these hours, please refer to coverage provider listed on [email or web address] 02/20/2024, 10:30 AM

## 2024-02-20 NOTE — Progress Notes (Signed)
   Palliative Medicine Inpatient Follow Up Note HPI: Ryan Long is a 63 y.o. male with medical history significant of chronic HFrEF/end-stage cardiomyopathy status post AICD, NSVT, HIV, hypertension, hyperlipidemia, COPD, anxiety, depression, bipolar disorder, substance abuse, BPH, CKD stage IIIa.     Patient was discharged from [facility] and readmitted with heart failure exacerbation. The PMT has been asked to support additional goals of care conversations.   Today's Discussion 02/20/2024  *Please note that this is a verbal dictation therefore any spelling or grammatical errors are due to the "Dragon Medical One" system interpretation.  Chart reviewed inclusive of vital signs, progress notes, laboratory results, and diagnostic images.   I met with [name] this evening. He is awake and alert.  He and I discussed the events over the past day. He shares that he is feeling defeated recently by his intrusive thoughts. He shares that he "can't shake" wanting to jump in front of a moving vehicle. I offered him time and space to express himself.  We discussed the option of him going to behavioral health which he shares he "may need to do". He notes that he wants these feelings to improve and if having a hard time working past them.   Speaking with patient we discussed the severity of his heart failure. I shared that this will continue to worsen over time. I offered him support as it appears he is having a very difficult time coming to grips with his present prognosis.   Plan for the time being is continue present interventions.   Questions and concerns addressed/Palliative Support Provided.   Objective Assessment: Vital Signs Vitals:   02/20/24 0430 02/20/24 0911  BP: 91/69   Pulse: 100   Resp:    Temp: (!) 97.1 F (36.2 C)   SpO2: 96% 95%    Intake/Output Summary (Last 24 hours) at 02/20/2024 1154 Last data filed at 02/20/2024 4098 Gross per 24 hour  Intake 703 ml  Output 325 ml  Net  378 ml   Last Weight  Most recent update: 02/20/2024  5:42 AM    Weight  79.7 kg (175 lb 12.8 oz)            Gen: Older African-American male in no acute distress HEENT: moist mucous membranes CV: Regular rate and rhythm PULM: On RA,  breathing is even and nonlabored ABD: soft/nontender EXT: (+) BLE edema Neuro: Alert and oriented x3  SUMMARY OF RECOMMENDATIONS   DNAR/DNI   [name] is agreeable to placement with OP Palliative and eventual hospice support  Ongoing palliative care support - I plan to check in on Sunday  Billing based on MDM: Moderate ______________________________________________________________________________________ [name] [place] Palliative Medicine Team Team Cell Phone: [phone] Please utilize secure chat with additional questions, if there is no response within 30 minutes please call the above phone number  Palliative Medicine Team providers are available by phone from 7am to 7pm daily and can be reached through the team cell phone.  Should this patient require assistance outside of these hours, please call the patient's attending physician.

## 2024-02-20 NOTE — TOC Progression Note (Signed)
 Transition of Care [facility]) - Progression Note    Patient Details  Name: Ryan Long MRN: [id number] Date of Birth: 1961-04-22  Transition of [facility]) CM/SW Contact  Jaria Conway, Olegario Messier, RN Phone Number: 02/20/2024, 2:36 PM  Clinical Narrative:Noted patient chose [facility] [address] for ST SNF-rep aware.. Will await MD/medical team to determine d/c plans since Psych-recc inpt Psych @ d/c. Continue to monitor.       Expected Discharge Plan: Skilled Nursing Facility Barriers to Discharge: Continued Medical Work up  Expected Discharge Plan and Services       Living arrangements for the past 2 months: Boarding House ([employer])                                       Social Determinants of Health (SDOH) Interventions SDOH Screenings   Food Insecurity: No Food Insecurity (02/17/2024)  Housing: Low Risk  (02/17/2024)  Transportation Needs: No Transportation Needs (02/17/2024)  Utilities: Not At Risk (02/17/2024)  Alcohol Screen: Low Risk  (07/17/2023)  Depression (PHQ2-9): Low Risk  (08/01/2023)  Financial Resource Strain: Low Risk  (07/17/2023)  Physical Activity: Insufficiently Active (07/17/2023)  Social Connections: Moderately Integrated (07/17/2023)  Stress: Stress Concern Present (07/17/2023)  Tobacco Use: High Risk (02/16/2024)  Health Literacy: Adequate Health Literacy (07/18/2023)    Readmission Risk Interventions     No data to display

## 2024-02-20 NOTE — Progress Notes (Signed)
 Per [name]'s request, I visited to offer ongoing support.  [name] presented with an improved disposition. He was less labile and gaining more acceptance. [name] expressed interest in the meaning of Ash Wednesday. He endorsed having some but fewer SI but just some intrusive negative thoughts generally.   I provided compassionate presence and active listening. I explored meaning making, including Ash Wednesday and faith generally as one potential way to cope. Also invited other strategies to gain control over intrusive thoughts (new relationships when relocating, movement, music).  Will continue to follow.  Sanjana Folz L. Sophronia Simas, M.Div [phone]
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
## 2024-02-20 NOTE — Progress Notes (Signed)
 PT Cancellation Note  Patient Details Name: Ryan Long MRN: [id number] DOB: Aug 28, 1961   Cancelled Treatment:    Reason Eval/Treat Not Completed: Pain limiting ability to participate (pt c/o headache, declined all mobility.)   Tamala Ser PT 02/20/2024  Acute Rehabilitation Services  Office [phone]

## 2024-02-21 DIAGNOSIS — I5023 Acute on chronic systolic (congestive) heart failure: Secondary | ICD-10-CM | POA: Diagnosis not present

## 2024-02-21 LAB — BASIC METABOLIC PANEL
Anion gap: 13 (ref 5–15)
BUN: 12 mg/dL (ref 8–23)
CO2: 23 mmol/L (ref 22–32)
Calcium: 8.6 mg/dL — ABNORMAL LOW (ref 8.9–10.3)
Chloride: 105 mmol/L (ref 98–111)
Creatinine, Ser: 1.52 mg/dL — ABNORMAL HIGH (ref 0.61–1.24)
GFR, Estimated: 51 mL/min — ABNORMAL LOW (ref >60)
Glucose, Bld: 94 mg/dL (ref 70–99)
Potassium: 3.8 mmol/L (ref 3.5–5.1)
Sodium: 141 mmol/L (ref 135–145)

## 2024-02-21 LAB — BRAIN NATRIURETIC PEPTIDE: B Natriuretic Peptide: 1612.2 pg/mL — ABNORMAL HIGH (ref 0.0–100.0)

## 2024-02-21 LAB — MAGNESIUM: Magnesium: 1.9 mg/dL (ref 1.7–2.4)

## 2024-02-21 MED ORDER — SUMATRIPTAN SUCCINATE 25 MG PO TABS
25.0000 mg | ORAL_TABLET | ORAL | Status: DC | PRN
  Administered 2024-02-21 – 2024-02-22 (×2): 25 mg via ORAL
  Filled 2024-02-21 (×4): qty 1

## 2024-02-21 MED ORDER — ENSURE ENLIVE PO LIQD
237.0000 mL | Freq: Two times a day (BID) | ORAL | Status: DC
Start: 2024-02-21 — End: 2024-03-07
  Administered 2024-02-21 – 2024-03-07 (×29): 237 mL via ORAL
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
## 2024-02-21 NOTE — Progress Notes (Signed)
 PROGRESS NOTE    Ryan Long  MVH:[id number] DOB: 27-May-1961 DOA: 02/16/2024 PCP: Cristino Martes, NP    Brief Narrative:   Ryan Long is a 63 y.o. male with past medical history significant for chronic HFrEF/end-stage cardiomyopathy status post AICD, NSVT, HIV, hypertension, hyperlipidemia, COPD, anxiety, depression, bipolar disorder, substance abuse, BPH, CKD stage IIIa who presented to [facility] ED on 02/16/2024 with complaints of chest pain, shortness of breath. Patient states since he left the hospital last time he has continued to have shortness of breath, heart palpitations, lightheadedness, and substernal chest pain. Symptoms have been worse for the past few days prompting him to come into the ED to be evaluated. He is taking his home medications as prescribed. Patient states he was previously advised to go to a skilled nursing facility and had declined but now regrets his decision. He is tearful and concerned that his advanced heart failure is affecting the quality of his life. Patient mentions having thoughts of walking to the street and jumping in front of a car to end his life and states he has not been able to do so as he is barely able to walk due to shortness of breath.   In the ED, Temperature 97.8 F, pulse 115, respiratory rate 21, blood pressure 123/94, and SpO2 100% on room air. Labs showing WBC count 2.7 (was 3.4 on labs 3 weeks ago), hemoglobin 18.1 (previously elevated as well and stable), creatinine 1.7 (was 1.4 on labs 2 weeks ago), BNP 1959, troponin negative, lactic acid normal, blood cultures collected, UDS pending. Chest x-ray showing no active cardiopulmonary disease. ED physician discussed the case with on-call cardiologist Dr. Orson Aloe who recommended admitting the patient to [facility] by hospitalist service and requested that cardiology team is consulted during daytime. Patient was given IV Lasix 40 mg. TRH called to admit.   Assessment &  Plan:   Acute on chronic HFrEF/end-stage nonischemic cardiomyopathy TTE October 2024 showing EF 20 to 25%.  Patient was recently admitted 2/5-2/21 for worsening heart failure symptoms and VT storm.  Palliative care was consulted and patient had decided to pursue hospice so his ICD was inactivated.  He is now presenting with worsening dyspnea on exertion and lower extremity edema. BNP elevated at 1959.  Chest x-ray not suggestive of pulmonary edema.  -- Cardiology following, appreciate assistance -- BNP 1959>[id number] -- net negative 4.7L since admission; with 2 unmeasured occurrences past 24 hours -- wt 86.6>>79.8>79.7>78.2Kg -- Lasix 40 mg IV every 12 hours -- Spironolactone 25 mg p.o. daily -- Jardiance 10 mg p.o. daily -- Midodrine 10 mg p.o. 3 times daily for blood pressure support -- 1200 mL fluid restriction -- Strict I's and O's and daily weights -- BMP, BNP daily   Chest pain/elevated D-dimer Troponin negative and EKG without acute ischemic changes.  Recent cath done in January showing normal coronaries. UDS negative.  Vascular duplex ultrasound bilateral lower extremities negative for DVT.  Nuclear medicine VQ scan negative for pulmonary embolism.   AKI on CKD stage IIIa Likely cardiorenal in etiology.  Creatinine was 1.4 on labs 2 weeks ago and now increased to 1.7 on admission; likely secondary to decompensated CHF as above. -- Cr 1.71>1.49>1.46>1.35>1.64>1.52 -- Lasix as above -- BMP daily  Hypokalemia Repleted, potassium 3.8 this morning -- Repeat electrolytes in a.m.   Suicidal ideation History of anxiety, depression, bipolar disorder Patient is tearful and concerned that his advanced heart failure is affecting the quality of his life.  He  mentions having thoughts of walking to the street and jumping in front of a car to end his life and states he has not been able to do so as he is barely able to walk due to shortness of breath.  He also repeatedly told RN that  he is having thoughts of jumping in front of a car to end his life.   -- Psychiatry following, appreciate assistance -- Clonazepam 0.125 mg p.o. 3 times daily -- Marinol 5 mg p.o. twice daily -- Remeron 15 g p.o. nightly -- Sitter and suicide precautions discontinued on 02/20/2024   History of VT storm Continue amiodarone 200 mg  PO BID and mexiletine 250 mg PO BID. -- Monitor on telemetry   HIV -- continue Biktarvy, Valtrex due to history of recurrent HSV. -- outpatient follow-up with infectious disease   COPD Stable, no wheezing.   -- Breo Ellipta 1 puff daily --Albuterol neb every 6 hours as needed wheezing/shortness of breath   BPH Continue home medications.  Goals of care: Patient recently transition to hospice.  During previous admission treatment team seeking placement [facility] patient declined.  Patient is regretful not seeking placement as he needs a lot of care and now amenable. -- Palliative care following, appreciate assistance   DVT prophylaxis: heparin injection 5,000 Units Start: 02/19/24 1400    Code Status: Limited: Do not attempt resuscitation (DNR) -DNR-LIMITED -Do Not Intubate/DNI  Family Communication: No family present at bedside this morning  Disposition Plan:  Level of care: Telemetry Status is: Inpatient Remains inpatient appropriate because: IV diuresis; pending SNF placement vs inpatient psych   Consultants:  Cardiology Psychiatry Palliative care  Procedures:  Vascular duplex ultrasound bilateral extremities: negative for DVT NM VQ scan: Negative for pulmonary embolism  Antimicrobials:  None   Subjective: Patient seen examined bedside, lying in bed.  Dyspnea and lower extremity edema much improved.  Continues to tolerate IV diuresis.  Psychiatry made some adjustments in his medications yesterday.  Continues with intermittent complaints of chest discomfort.  Denies palpitations. No other specific complaints, concerns or questions at  this time.  Denies headache, no dizziness, no abdominal pain, no cough/congestion, no fever/chills/night sweats, no nausea/vomiting/diarrhea, no focal weakness, no fatigue, no paresthesia.  Objective: Vitals:   02/20/24 2033 02/21/24 0248 02/21/24 0500 02/21/24 0536  BP: 100/74 104/79  102/77  Pulse: 100 (!) 108  100  Resp: 20 20  20   Temp: 99.2 F (37.3 C) 98 F (36.7 C)  98.6 F (37 C)  TempSrc: Oral Oral  Oral  SpO2: 96% 95%  94%  Weight:   78.2 kg   Height:        Intake/Output Summary (Last 24 hours) at 02/21/2024 1220 Last data filed at 02/21/2024 0900 Gross per 24 hour  Intake 595 ml  Output 1825 ml  Net -1230 ml   Filed Weights   02/19/24 0403 02/20/24 0500 02/21/24 0500  Weight: 79.8 kg 79.7 kg 78.2 kg    Examination:  Physical Exam: GEN: NAD, alert and oriented x 3, chronically ill appearance, appears older than stated age HEENT: NCAT, PERRL, EOMI, sclera clear, MMM PULM: Breath sounds slight diminished bilateral bases, no wheezes/crackles, normal respiratory effort without accessory muscle use, on room air with SpO2 99% at rest CV: RRR w/o M/G/R GI: abd soft, NTND, + BS MSK: + LE peripheral edema, moves all extremities dependently NEURO: No focal neurological deficit PSYCH: Depressed mood, flat affect, + SI, denies HI Integumentary: No concerning rashes/lesions/wounds noted on exposed  skin surfaces    Data Reviewed: I have personally reviewed following labs and imaging studies  CBC: Recent Labs  Lab 02/16/24 1930 02/18/24 0535 02/19/24 0543  WBC 2.7* 2.1* 3.0*  HGB 18.1* 16.8 16.8  HCT 54.3* 50.2 48.6  MCV 100.0 100.2* 96.6  PLT 195 161 158   Basic Metabolic Panel: Recent Labs  Lab 02/17/24 0615 02/18/24 0535 02/19/24 0543 02/20/24 0521 02/21/24 0529  NA 139 139 138 138 141  K 3.6 3.3* 3.9 3.5 3.8  CL 109 106 106 103 105  CO2 19* 22 22 25 23   GLUCOSE 73 84 87 106* 94  BUN 17 18 15 16 12   CREATININE 1.49* 1.46* 1.35* 1.64* 1.52*  CALCIUM  7.9* 7.7* 7.9* 7.9* 8.6*  MG  --  2.1 2.1 2.0 1.9   GFR: Estimated Creatinine Clearance: 48.8 mL/min (A) (by C-G formula based on SCr of 1.52 mg/dL (H)). Liver Function Tests: Recent Labs  Lab 02/17/24 0615 02/18/24 0535 02/19/24 0543 02/20/24 0521  AST 37 26 28 20   ALT 45* 40 34 29  ALKPHOS 65 61 61 61  BILITOT 2.2* 1.6* 1.9* 1.1  PROT 5.6* 4.8* 5.2* 5.2*  ALBUMIN 2.9* 2.6* 2.9* 2.7*   No results for input(s): "LIPASE", "AMYLASE" in the last 168 hours. No results for input(s): "AMMONIA" in the last 168 hours. Coagulation Profile: Recent Labs  Lab 02/17/24 1027  INR 1.2   Cardiac Enzymes: No results for input(s): "CKTOTAL", "CKMB", "CKMBINDEX", "TROPONINI" in the last 168 hours. BNP (last 3 results) No results for input(s): "PROBNP" in the last 8760 hours. HbA1C: No results for input(s): "HGBA1C" in the last 72 hours. CBG: Recent Labs  Lab 02/18/24 1746  GLUCAP 110*   Lipid Profile: No results for input(s): "CHOL", "HDL", "LDLCALC", "TRIG", "CHOLHDL", "LDLDIRECT" in the last 72 hours. Thyroid Function Tests: No results for input(s): "TSH", "T4TOTAL", "FREET4", "T3FREE", "THYROIDAB" in the last 72 hours. Anemia Panel: No results for input(s): "VITAMINB12", "FOLATE", "FERRITIN", "TIBC", "IRON", "RETICCTPCT" in the last 72 hours. Sepsis Labs: Recent Labs  Lab 02/16/24 2049  LATICACIDVEN 1.4    Recent Results (from the past 240 hours)  Culture, blood (Routine x 2)     Status: None (Preliminary result)   Collection Time: 02/16/24  8:40 PM   Specimen: BLOOD  Result Value Ref Range Status   Specimen Description   Final    BLOOD RIGHT ANTECUBITAL Performed at [facility], [address] [place], [place] [address]    Special Requests   Final    BOTTLES DRAWN AEROBIC AND ANAEROBIC Blood Culture adequate volume Performed at [facility], [address] [place], [place] [address]    Culture   Final    NO GROWTH 3  DAYS Performed at [facility], [address] [place], [place] [address]    Report Status PENDING  Incomplete  Culture, blood (Routine x 2)     Status: None (Preliminary result)   Collection Time: 02/16/24  9:00 PM   Specimen: BLOOD  Result Value Ref Range Status   Specimen Description   Final    BLOOD LEFT ANTECUBITAL Performed at [facility], [address], [place], [place] [address]    Special Requests   Final    BOTTLES DRAWN AEROBIC AND ANAEROBIC Blood Culture adequate volume Performed at [facility], [address] [place], [place] [address]    Culture   Final    NO GROWTH 3 DAYS  Performed at [facility], [address] [place], [place] [address]    Report Status PENDING  Incomplete         Radiology Studies: No results found.       Scheduled Meds:  amiodarone  200 mg Oral BID   aspirin EC  81 mg Oral Daily   bictegravir-emtricitabine-tenofovir AF  1 tablet Oral Daily   clonazepam  0.125 mg Oral TID   dronabinol  5 mg Oral BID AC   empagliflozin  10 mg Oral QAC breakfast   feeding supplement  237 mL Oral BID BM   finasteride  5 mg Oral Daily   fluticasone furoate-vilanterol  1 puff Inhalation Daily   furosemide  40 mg Intravenous BID   heparin injection (subcutaneous)  5,000 Units Subcutaneous Q8H   mexiletine  250 mg Oral Q12H   midodrine  10 mg Oral TID WC   mirtazapine  15 mg Oral QHS   polyethylene glycol  17 g Oral Daily   spironolactone  25 mg Oral Daily   tamsulosin  0.4 mg Oral BID   valACYclovir  500 mg Oral Daily   Continuous Infusions:     LOS: 3 days    Time spent: 45 minutes spent on chart review, discussion with nursing staff, consultants, updating family and interview/physical exam; more than 50% of that time was spent in counseling and/or coordination of care.    Alvira Philips [place], DO [facility] Available via Epic secure chat 7am-7pm After these hours, please refer  to coverage provider listed on [email or web address] 02/21/2024, 12:20 PM

## 2024-02-21 NOTE — Plan of Care (Signed)
  Problem: Education: Goal: Knowledge of General Education information will improve Description: Including pain rating scale, medication(s)/side effects and non-pharmacologic comfort measures Outcome: Progressing   Problem: Clinical Measurements: Goal: Ability to maintain clinical measurements within normal limits will improve Outcome: Progressing Goal: Will remain free from infection Outcome: Progressing Goal: Diagnostic test results will improve Outcome: Progressing Goal: Respiratory complications will improve Outcome: Progressing Goal: Cardiovascular complication will be avoided Outcome: Progressing   Problem: Nutrition: Goal: Adequate nutrition will be maintained Outcome: Progressing   Problem: Elimination: Goal: Will not experience complications related to bowel motility Outcome: Progressing

## 2024-02-22 ENCOUNTER — Inpatient Hospital Stay (HOSPITAL_COMMUNITY)

## 2024-02-22 DIAGNOSIS — R079 Chest pain, unspecified: Secondary | ICD-10-CM

## 2024-02-22 DIAGNOSIS — F4321 Adjustment disorder with depressed mood: Secondary | ICD-10-CM | POA: Diagnosis not present

## 2024-02-22 DIAGNOSIS — F1990 Other psychoactive substance use, unspecified, uncomplicated: Secondary | ICD-10-CM | POA: Diagnosis not present

## 2024-02-22 DIAGNOSIS — F329 Major depressive disorder, single episode, unspecified: Secondary | ICD-10-CM

## 2024-02-22 DIAGNOSIS — J449 Chronic obstructive pulmonary disease, unspecified: Secondary | ICD-10-CM

## 2024-02-22 DIAGNOSIS — N1831 Chronic kidney disease, stage 3a: Secondary | ICD-10-CM | POA: Insufficient documentation

## 2024-02-22 DIAGNOSIS — R45851 Suicidal ideations: Secondary | ICD-10-CM

## 2024-02-22 DIAGNOSIS — I5023 Acute on chronic systolic (congestive) heart failure: Secondary | ICD-10-CM | POA: Diagnosis not present

## 2024-02-22 DIAGNOSIS — Z21 Asymptomatic human immunodeficiency virus [HIV] infection status: Secondary | ICD-10-CM

## 2024-02-22 DIAGNOSIS — F32A Depression, unspecified: Secondary | ICD-10-CM

## 2024-02-22 DIAGNOSIS — F4310 Post-traumatic stress disorder, unspecified: Secondary | ICD-10-CM | POA: Diagnosis not present

## 2024-02-22 LAB — CULTURE, BLOOD (ROUTINE X 2)
Culture: NO GROWTH
Culture: NO GROWTH
Special Requests: ADEQUATE
Special Requests: ADEQUATE

## 2024-02-22 LAB — CBC
HCT: 50.7 % (ref 39.0–52.0)
Hemoglobin: 17.6 g/dL — ABNORMAL HIGH (ref 13.0–17.0)
MCH: 33.7 pg (ref 26.0–34.0)
MCHC: 34.7 g/dL (ref 30.0–36.0)
MCV: 97.1 fL (ref 80.0–100.0)
Platelets: 154 10*3/uL (ref 150–400)
RBC: 5.22 MIL/uL (ref 4.22–5.81)
RDW: 14.8 % (ref 11.5–15.5)
WBC: 2.5 10*3/uL — ABNORMAL LOW (ref 4.0–10.5)
nRBC: 0 % (ref 0.0–0.2)

## 2024-02-22 LAB — BASIC METABOLIC PANEL
Anion gap: 11 (ref 5–15)
BUN: 20 mg/dL (ref 8–23)
CO2: 26 mmol/L (ref 22–32)
Calcium: 8.3 mg/dL — ABNORMAL LOW (ref 8.9–10.3)
Chloride: 103 mmol/L (ref 98–111)
Creatinine, Ser: 1.79 mg/dL — ABNORMAL HIGH (ref 0.61–1.24)
GFR, Estimated: 42 mL/min — ABNORMAL LOW (ref >60)
Glucose, Bld: 92 mg/dL (ref 70–99)
Potassium: 4 mmol/L (ref 3.5–5.1)
Sodium: 140 mmol/L (ref 135–145)

## 2024-02-22 LAB — BRAIN NATRIURETIC PEPTIDE: B Natriuretic Peptide: 1651 pg/mL — ABNORMAL HIGH (ref 0.0–100.0)

## 2024-02-22 MED ORDER — POLYETHYLENE GLYCOL 3350 17 G PO PACK
17.0000 g | PACK | Freq: Once | ORAL | Status: AC
  Administered 2024-02-22: 17 g via ORAL
  Filled 2024-02-22: qty 1

## 2024-02-22 MED ORDER — SERTRALINE HCL 25 MG PO TABS
25.0000 mg | ORAL_TABLET | Freq: Every day | ORAL | Status: DC
  Administered 2024-02-22 – 2024-02-23 (×2): 25 mg via ORAL
  Filled 2024-02-22 (×2): qty 1

## 2024-02-22 NOTE — Progress Notes (Signed)
 PROGRESS NOTE   Ryan Long  JYN:[id number] DOB: November 05, 1961 DOA: 02/16/2024 PCP: Cristino Martes, NP   Date of Service: the patient was seen and examined on 02/22/2024  Brief Narrative:  63 y.o. male with medical history significant of systolic and diastolic congestive heart failure (Echo 09/2023 EF 20-25% with G1DD)  status post AICD, NSVT, HIV, hypertension, hyperlipidemia, COPD, anxiety, depression, bipolar disorder, substance abuse, BPH, CKD stage IIIa.  Presenting to [facility] emergency department with chest pain and shortness of breath.    Of note, patient was recently admitted by cardiology service 2/5-2/21 for worsening heart failure symptoms and VT storm.  Palliative care was consulted and patient was made DNR/DNI.  Patient had decided to pursue hospice and his ICD was inactivated.  He was discharged to group home with hospice.   Upon evaluation in the emergency department, case was discussed with cardiology and and the hospitalist was called to assess the patient for admission to the hospital.   Cardiology was consulted and patient was placed on a regimen of intravenous Lasix and fluid restriction.  Strict input and output monitoring was performed as well as daily weights.  Throughout the course of the hospitalization patient was also found to be extremely tearful and reports of suicidal ideation.  Psychiatry was consulted and upon their evaluation they additionally agreed that patient's suicidal ideation with plan was concerning.  Initially, plan is for patient to be discharged to a geriatric psychiatry bed but due to his poor performance status they felt the patient was not a candidate.   Assessment & Plan Acute on chronic HFrEF (heart failure with reduced ejection fraction) (HCC) Patient exhibiting continued elevation of BNP Exam revealing continued elevated JVP and mild peripheral edema Continue furosemide 40 mg twice daily Obtain repeat chest x-ray HIV (human  immunodeficiency virus infection) (HCC) Continue Biktarvy Continue Valtrex due to history of recurrent HSV Outpatient ID follow-up Depression with suicidal ideation Patient continues to complain of feelings of depression with suicidal ideation Case discussed with Dr. Clovis Riley with psychiatry, his service will continue to follow the patient closely throughout the weekend and also repeatedly assess suicidal ideations. Patient is not a candidate for geriatric psychiatric unit per my discussion with psychiatry. COPD (chronic obstructive pulmonary disease) (HCC) No evidence of COPD exacerbation this time Continue home regimen of maintenance inhalers As needed bronchodilator therapy for episodic shortness of breath and wheezing.  Chest pain Troponins unremarkable. EKGs revealed no evidence of dynamic ST segment change Recent cardiac catheterization performed 12/2023 revealing normal coronaries VQ scan negative for pulmonary embolism Ventricular tachycardia (HCC) Continue amiodarone and mexiletine Monitoring on telemetry Chronic kidney disease, stage 3a (HCC) Strict intake and output monitoring Creatinine near baseline Minimizing nephrotoxic agents as much as possible Serial chemistries to monitor renal function and electrolytes      Subjective:  Patient complaining of feelings of severe depression.  Patient reports desire to hurt or kill himself.  Patient complaining of some shortness of breath, patient denies chest pain.  Physical Exam:  Vitals:   02/22/24 1342 02/22/24 1423 02/22/24 2020 02/22/24 2026  BP: 95/79 105/72 (!) 86/66 (!) 92/57  Pulse: (!) 103 (!) 102 100   Resp: (!) 22 18 20    Temp: 97.7 F (36.5 C) 98 F (36.7 C) 97.7 F (36.5 C)   TempSrc: Oral Oral Oral   SpO2: 98% 98% 91%   Weight:      Height:        Constitutional: Awake alert and oriented x3,  no associated distress.  Depressed mood. Skin: no rashes, no lesions, good skin turgor noted. Eyes: Pupils  are equally reactive to light.  No evidence of scleral icterus or conjunctival pallor.  ENMT: Moist mucous membranes noted.  Posterior pharynx clear of any exudate or lesions.   Respiratory: Somewhat coarse breath sounds with bibasilar rales.  Significant rhonchi bilaterally.  Normal respiratory effort. No accessory muscle use.  Cardiovascular: Regular rate and rhythm, no murmurs / rubs / gallops.  +1 distal bilateral lower extremity pitting edema.  2+ pedal pulses. No carotid bruits.  Abdomen: Abdomen is soft and nontender.  No evidence of intra-abdominal masses.  Positive bowel sounds noted in all quadrants.   Musculoskeletal: No joint deformity upper and lower extremities. Good ROM, no contractures. Normal muscle tone.    Data Reviewed:  I have personally reviewed and interpreted labs, imaging.  Significant findings are   CBC: Recent Labs  Lab 02/16/24 1930 02/18/24 0535 02/19/24 0543 02/22/24 0455  WBC 2.7* 2.1* 3.0* 2.5*  HGB 18.1* 16.8 16.8 17.6*  HCT 54.3* 50.2 48.6 50.7  MCV 100.0 100.2* 96.6 97.1  PLT 195 161 158 154   Basic Metabolic Panel: Recent Labs  Lab 02/18/24 0535 02/19/24 0543 02/20/24 0521 02/21/24 0529 02/22/24 0455  NA 139 138 138 141 140  K 3.3* 3.9 3.5 3.8 4.0  CL 106 106 103 105 103  CO2 22 22 25 23 26   GLUCOSE 84 87 106* 94 92  BUN 18 15 16 12 20   CREATININE 1.46* 1.35* 1.64* 1.52* 1.79*  CALCIUM 7.7* 7.9* 7.9* 8.6* 8.3*  MG 2.1 2.1 2.0 1.9  --    GFR: Estimated Creatinine Clearance: 41.4 mL/min (A) (by C-G formula based on SCr of 1.79 mg/dL (H)). Liver Function Tests: Recent Labs  Lab 02/17/24 0615 02/18/24 0535 02/19/24 0543 02/20/24 0521  AST 37 26 28 20   ALT 45* 40 34 29  ALKPHOS 65 61 61 61  BILITOT 2.2* 1.6* 1.9* 1.1  PROT 5.6* 4.8* 5.2* 5.2*  ALBUMIN 2.9* 2.6* 2.9* 2.7*    Coagulation Profile: Recent Labs  Lab 02/17/24 1027  INR 1.2    Code Status:  DNR.  Code status decision has been confirmed with:  patient    Severity of Illness:  The appropriate patient status for this patient is INPATIENT. Inpatient status is judged to be reasonable and necessary in order to provide the required intensity of service to ensure the patient's safety. The patient's presenting symptoms, physical exam findings, and initial radiographic and laboratory data in the context of their chronic comorbidities is felt to place them at high risk for further clinical deterioration. Furthermore, it is not anticipated that the patient will be medically stable for discharge from the hospital within 2 midnights of admission.   * I certify that at the point of admission it is my clinical judgment that the patient will require inpatient hospital care spanning beyond 2 midnights from the point of admission due to high intensity of service, high risk for further deterioration and high frequency of surveillance required.*  Time spent:  55 minutes  Author:  Marinda Elk MD  02/22/2024 10:44 PM

## 2024-02-22 NOTE — Assessment & Plan Note (Signed)
 Patient exhibiting continued elevation of BNP Exam revealing continued elevated JVP and mild peripheral edema Continue furosemide 40 mg twice daily Obtain repeat chest x-ray

## 2024-02-22 NOTE — Progress Notes (Addendum)
 Pt is at high fall risk , but refused a bed alarm despite explanation/ education. Bed alarm turned off per pt request.

## 2024-02-22 NOTE — Assessment & Plan Note (Signed)
 Troponins unremarkable. EKGs revealed no evidence of dynamic ST segment change Recent cardiac catheterization performed 12/2023 revealing normal coronaries VQ scan negative for pulmonary embolism

## 2024-02-22 NOTE — Assessment & Plan Note (Signed)
 Strict intake and output monitoring Creatinine near baseline Minimizing nephrotoxic agents as much as possible Serial chemistries to monitor renal function and electrolytes

## 2024-02-22 NOTE — Assessment & Plan Note (Signed)
 Continue amiodarone and mexiletine Monitoring on telemetry
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
## 2024-02-22 NOTE — Hospital Course (Addendum)
 63 y.o. male with medical history significant of systolic and diastolic congestive heart failure (Echo 09/2023 EF 20-25% with G1DD)  status post AICD (has been off since 01/28/2024), NSVT, HIV, hypertension, hyperlipidemia, COPD, anxiety, depression, bipolar disorder, substance abuse, BPH, CKD stage IIIa.  Presenting to [facility] emergency department with chest pain and shortness of breath.    Of note, patient was recently admitted by cardiology service 2/5-2/21 for worsening heart failure symptoms and VT storm.  Palliative care was consulted and patient was made DNR/DNI.  Patient had decided to pursue hospice and his ICD was inactivated.  He was discharged to group home with hospice.   Upon evaluation in the emergency department, case was discussed with cardiology and and the hospitalist was called to assess the patient for admission to the hospital.   Cardiology was consulted and patient was placed on a regimen of intravenous Lasix and fluid restriction.  Strict input and output monitoring was performed as well as daily weights.  Throughout the course of the hospitalization patient was also found to be extremely tearful and reports of suicidal ideation.  Psychiatry was consulted and upon their evaluation they additionally agreed that patient's suicidal ideation with plan was concerning.  Initially, plan is for patient to be discharged to a geriatric psychiatry bed but due to his poor performance status they felt the patient was not a candidate.

## 2024-02-22 NOTE — Progress Notes (Signed)
 Patient a high fall risk but refusing bed alarm. Safety Education explained/reviewed with the Patient.

## 2024-02-22 NOTE — TOC Progression Note (Signed)
 Transition of Care [facility]) - Progression Note    Patient Details  Name: Ryan Long MRN: [id number] Date of Birth: 10/08/1961  Transition of Care [facility]) CM/SW Contact  Adrian Prows, RN Phone Number: 02/22/2024, 5:06 PM  Clinical Narrative:    Notified by Dr Leafy Half pt not medically ready for d/c; TOC is following.   Expected Discharge Plan: Skilled Nursing Facility Barriers to Discharge: Continued Medical Work up  Expected Discharge Plan and Services       Living arrangements for the past 2 months: Boarding House ([facility])                                       Social Determinants of Health (SDOH) Interventions SDOH Screenings   Food Insecurity: No Food Insecurity (02/17/2024)  Housing: Low Risk  (02/17/2024)  Transportation Needs: No Transportation Needs (02/17/2024)  Utilities: Not At Risk (02/17/2024)  Alcohol Screen: Low Risk  (07/17/2023)  Depression (PHQ2-9): Low Risk  (08/01/2023)  Financial Resource Strain: Low Risk  (07/17/2023)  Physical Activity: Insufficiently Active (07/17/2023)  Social Connections: Moderately Integrated (07/17/2023)  Stress: Stress Concern Present (07/17/2023)  Tobacco Use: High Risk (02/16/2024)  Health Literacy: Adequate Health Literacy (07/18/2023)    Readmission Risk Interventions     No data to display

## 2024-02-22 NOTE — Assessment & Plan Note (Signed)
   No evidence of COPD exacerbation this time  Continue home regimen of maintenance inhalers  As needed bronchodilator therapy for episodic shortness of breath and wheezing.  

## 2024-02-22 NOTE — Progress Notes (Signed)
   02/22/24 1342  Assess: MEWS Score  Temp 97.7 F (36.5 C)  BP 95/79  MAP (mmHg) 86  Pulse Rate (!) 103  Resp (!) 22  Level of Consciousness Alert  SpO2 98 %  Assess: MEWS Score  MEWS Temp 0  MEWS Systolic 1  MEWS Pulse 1  MEWS RR 1  MEWS LOC 0  MEWS Score 3  MEWS Score Color Yellow  Assess: if the MEWS score is Yellow or Red  Were vital signs accurate and taken at a resting state? Yes  Does the patient meet 2 or more of the SIRS criteria? Yes  Does the patient have a confirmed or suspected source of infection? No  Notify: Charge Nurse/RN  Name of Charge Nurse/RN Notified Nani Gasser RN  Provider Notification  Provider Name/Title Dr. Leafy Half  Date Provider Notified 02/22/24  Time Provider Notified 1355  Method of Notification Page  Assess: SIRS CRITERIA  SIRS Temperature  0  SIRS Respirations  1  SIRS Pulse 1  SIRS WBC 0  SIRS Score Sum  2

## 2024-02-22 NOTE — Assessment & Plan Note (Signed)
 Continue Biktarvy Continue Valtrex due to history of recurrent HSV Outpatient ID follow-up

## 2024-02-22 NOTE — Assessment & Plan Note (Signed)
 Patient continues to complain of feelings of depression with suicidal ideation Case discussed with Dr. Clovis Riley with psychiatry, his service will continue to follow the patient closely throughout the weekend and also repeatedly assess suicidal ideations. Patient is not a candidate for geriatric psychiatric unit per my discussion with psychiatry.

## 2024-02-23 DIAGNOSIS — N1831 Chronic kidney disease, stage 3a: Secondary | ICD-10-CM | POA: Diagnosis not present

## 2024-02-23 DIAGNOSIS — I5023 Acute on chronic systolic (congestive) heart failure: Secondary | ICD-10-CM | POA: Diagnosis not present

## 2024-02-23 DIAGNOSIS — R45851 Suicidal ideations: Secondary | ICD-10-CM | POA: Diagnosis not present

## 2024-02-23 DIAGNOSIS — R079 Chest pain, unspecified: Secondary | ICD-10-CM | POA: Diagnosis not present

## 2024-02-23 DIAGNOSIS — F32A Depression, unspecified: Secondary | ICD-10-CM | POA: Diagnosis not present

## 2024-02-23 DIAGNOSIS — F1990 Other psychoactive substance use, unspecified, uncomplicated: Secondary | ICD-10-CM | POA: Diagnosis not present

## 2024-02-23 DIAGNOSIS — F4310 Post-traumatic stress disorder, unspecified: Secondary | ICD-10-CM | POA: Diagnosis not present

## 2024-02-23 DIAGNOSIS — Z21 Asymptomatic human immunodeficiency virus [HIV] infection status: Secondary | ICD-10-CM | POA: Diagnosis not present

## 2024-02-23 LAB — COMPREHENSIVE METABOLIC PANEL
ALT: 27 U/L (ref 0–44)
AST: 20 U/L (ref 15–41)
Albumin: 2.7 g/dL — ABNORMAL LOW (ref 3.5–5.0)
Alkaline Phosphatase: 64 U/L (ref 38–126)
Anion gap: 10 (ref 5–15)
BUN: 22 mg/dL (ref 8–23)
CO2: 27 mmol/L (ref 22–32)
Calcium: 8.3 mg/dL — ABNORMAL LOW (ref 8.9–10.3)
Chloride: 101 mmol/L (ref 98–111)
Creatinine, Ser: 1.7 mg/dL — ABNORMAL HIGH (ref 0.61–1.24)
GFR, Estimated: 45 mL/min — ABNORMAL LOW (ref >60)
Glucose, Bld: 92 mg/dL (ref 70–99)
Potassium: 3.7 mmol/L (ref 3.5–5.1)
Sodium: 138 mmol/L (ref 135–145)
Total Bilirubin: 1.7 mg/dL — ABNORMAL HIGH (ref 0.0–1.2)
Total Protein: 5.4 g/dL — ABNORMAL LOW (ref 6.5–8.1)

## 2024-02-23 LAB — CBC WITH DIFFERENTIAL/PLATELET
Abs Immature Granulocytes: 0.01 10*3/uL (ref 0.00–0.07)
Basophils Absolute: 0 10*3/uL (ref 0.0–0.1)
Basophils Relative: 1 %
Eosinophils Absolute: 0 10*3/uL (ref 0.0–0.5)
Eosinophils Relative: 1 %
HCT: 50.3 % (ref 39.0–52.0)
Hemoglobin: 16.6 g/dL (ref 13.0–17.0)
Immature Granulocytes: 0 %
Lymphocytes Relative: 40 %
Lymphs Abs: 1 10*3/uL (ref 0.7–4.0)
MCH: 32.9 pg (ref 26.0–34.0)
MCHC: 33 g/dL (ref 30.0–36.0)
MCV: 99.8 fL (ref 80.0–100.0)
Monocytes Absolute: 0.4 10*3/uL (ref 0.1–1.0)
Monocytes Relative: 16 %
Neutro Abs: 1.1 10*3/uL — ABNORMAL LOW (ref 1.7–7.7)
Neutrophils Relative %: 42 %
Platelets: 162 10*3/uL (ref 150–400)
RBC: 5.04 MIL/uL (ref 4.22–5.81)
RDW: 14.8 % (ref 11.5–15.5)
WBC: 2.6 10*3/uL — ABNORMAL LOW (ref 4.0–10.5)
nRBC: 0 % (ref 0.0–0.2)

## 2024-02-23 LAB — PHOSPHORUS: Phosphorus: 3.5 mg/dL (ref 2.5–4.6)

## 2024-02-23 LAB — MAGNESIUM: Magnesium: 2.1 mg/dL (ref 1.7–2.4)

## 2024-02-23 MED ORDER — ARIPIPRAZOLE 5 MG PO TABS
5.0000 mg | ORAL_TABLET | Freq: Every day | ORAL | Status: DC
  Administered 2024-02-23 – 2024-02-27 (×5): 5 mg via ORAL
  Filled 2024-02-23 (×6): qty 1

## 2024-02-23 MED ORDER — SERTRALINE HCL 50 MG PO TABS
50.0000 mg | ORAL_TABLET | Freq: Every day | ORAL | Status: DC
  Administered 2024-02-24 – 2024-02-27 (×4): 50 mg via ORAL
  Filled 2024-02-23 (×4): qty 1

## 2024-02-23 MED ORDER — FUROSEMIDE 40 MG PO TABS
40.0000 mg | ORAL_TABLET | Freq: Two times a day (BID) | ORAL | Status: DC
  Administered 2024-02-24 – 2024-02-28 (×7): 40 mg via ORAL
  Filled 2024-02-23 (×9): qty 1

## 2024-02-23 MED ORDER — SPIRONOLACTONE 25 MG PO TABS
25.0000 mg | ORAL_TABLET | Freq: Every day | ORAL | Status: DC
  Administered 2024-02-24 – 2024-02-27 (×3): 25 mg via ORAL
  Filled 2024-02-23 (×4): qty 1

## 2024-02-23 MED ORDER — SPIRONOLACTONE 25 MG PO TABS: 50.0000 mg | ORAL_TABLET | Freq: Every day | ORAL | Status: DC

## 2024-02-23 NOTE — Assessment & Plan Note (Addendum)
 Recent hospitalization in February for VT storm  Continuing Mexitil  continue amiodarone  Monitoring on telemetry

## 2024-02-23 NOTE — Plan of Care (Signed)

## 2024-02-23 NOTE — Progress Notes (Addendum)
   02/23/24 1100  Antiarrhythmic device  Antiarrhythmic device No  Antiarrhythmic device type ICD  ICD On/Off Off (Contact EP/Vendor to reprogram)  ICD Manufacturer [employer]   ICD is turned off, patient requesting to know why. Informed that it was turned off after palliative/hospice care discussions during previous hospital admission. Patient would like it turned back on. MD made aware.

## 2024-02-23 NOTE — Assessment & Plan Note (Addendum)
 Strict intake and output monitoring Creatinine near baseline, currently 1.7 with baseline of 1.5. Minimizing nephrotoxic agents as much as possible Serial chemistries to monitor renal function and electrolytes
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
## 2024-02-23 NOTE — Progress Notes (Signed)
 2120 Charge made aware of pt suicidal ideation with plan to "Jump into traffic". Pt ad lib, steady and can walk halls. Psychiatry aware and in notes, no sitter ordered. Previous shift RN also clarified if needed a sitter with psychiatry. Will continue to monitor closely

## 2024-02-23 NOTE — Plan of Care (Signed)
   Problem: Clinical Measurements: Goal: Ability to maintain clinical measurements within normal limits will improve Outcome: Progressing

## 2024-02-23 NOTE — Progress Notes (Signed)
 Patient also remains suicidal with plan to jump into traffic. I discussed this with Psychiatry NP Ms Welford Roche during rounding I asked whether he needs a sitter. Informed no sitter needed.

## 2024-02-23 NOTE — Assessment & Plan Note (Addendum)
 Patient is now capable of ambulating down the hall with without significant difficulty and is able to perform ADLs and should be a candidate for geriatric psychiatry floor. Patient continues to complain of feelings of depression with suicidal ideation Case discussed with Dr. Clovis Riley with psychiatry, his service will continue to follow the patient closely throughout the weekend and also repeatedly assess suicidal ideations. Despite patient's guarded long-term prognosis, patient is currently medically cleared to be transferred to behavioral health as well for continued psychiatric care.

## 2024-02-23 NOTE — Assessment & Plan Note (Signed)
   No evidence of COPD exacerbation this time  Continue home regimen of maintenance inhalers  As needed bronchodilator therapy for episodic shortness of breath and wheezing.  

## 2024-02-23 NOTE — Assessment & Plan Note (Signed)
 Guarded prognosis Since patient's last hospitalization for VT storm, patient was transition to hospice due to his advanced congestive heart failure patient's ICD device has since been shut off

## 2024-02-23 NOTE — Assessment & Plan Note (Addendum)
 Patient is now capable of ambulating down the hall with without significant difficulty and is able to perform ADLs  Advanced disease with guarded prognosis Patient was recently transitioned to hospice after previous hospitalization for VT storm and ICD device was turned off as a result Physical exam reveals continued elevated JVP and mild peripheral edema Will attempt to transition patient from intravenous Lasix to oral Lasix therapy as a belief patient is nearing euvolemia.   Continue spironolactone, midodrine for blood pressure support Continuing Mexitil due to recent VT storm

## 2024-02-23 NOTE — Assessment & Plan Note (Addendum)
 Currently chest pain-free  Serial troponins unremarkable. EKGs revealed no evidence of dynamic ST segment change Recent cardiac catheterization performed 12/2023 revealing normal coronaries VQ scan negative for pulmonary embolism

## 2024-02-23 NOTE — Progress Notes (Signed)
 Physical Therapy Treatment Patient Details Name: Ryan Long MRN: [id number] DOB: Dec 29, 1960 Today's Date: 02/23/2024   History of Present Illness 63 y.o. male Patient presents to the ED complaining of chest pain and shortness of breath, SI. Dx of HFrEF. Pt with medical history significant of chronic HFrEF/end-stage cardiomyopathy status post AICD, NSVT, HIV, hypertension, hyperlipidemia, COPD, anxiety, depression, bipolar disorder, substance abuse, BPH, CKD stage IIIa.  Patient was recently admitted by cardiology service 2/5-2/21 for worsening heart failure symptoms and VT storm.  Palliative care was consulted and patient was made DNR/DNI.  Patient had decided to pursue hospice and his ICD was inactivated.  He was discharged to group home with hospice.    PT Comments  Pt initially disgruntled with PT on arrival stating "ya'll didn't come back. I've been walking myself.  Ya'll lied."  Apologized to pt on behalf of previous therapist misspeaking however explained this therapist had not yet worked with pt.  Pt observed ambulating to/from bathroom independently while this therapist was in room.  Pt declined ambulating in hallway at this time but states he has been in hallways walking.  Pt states sometimes he is more short of breath then other times.  Pt encouraged to use rollator which was present in room so he could take seated rest breaks as needed.  Pt appeared open to this idea.  Pt again declined participating further today however agreeable for PT to check back on Monday.  Pt appears to be progressing well with mobility and updated d/c recommendations to no further PT needs upon d/c.  Per pysch notes, "recommend inpatient psychiatric hospitalization" which pt appears physically able to participate.    If plan is discharge home, recommend the following: Assistance with cooking/housework   Can travel by [employer] manager (4 wheels)     Recommendations for Other Services       Precautions / Restrictions Precautions Precautions: None     Mobility  Bed Mobility Overal bed mobility: Independent                  Transfers Overall transfer level: Independent                      Ambulation/Gait Ambulation/Gait assistance: Supervision, Modified independent (Device/Increase time) Gait Distance (Feet): 20 Feet Assistive device: None Gait Pattern/deviations: WFL(Within Functional Limits)       General Gait Details: ambulate to/from bathroom, refused ambulating in hallway at this time   Stairs             Wheelchair Mobility     Tilt Bed    Modified Rankin (Stroke Patients Only)       Balance Overall balance assessment: No apparent balance deficits (not formally assessed)                                          Communication Communication Communication: No apparent difficulties  Cognition Arousal: Alert Behavior During Therapy: WFL for tasks assessed/performed   PT - Cognitive impairments: No apparent impairments                         Following commands: Intact      Cueing    Exercises      General Comments        Pertinent Vitals/Pain  Pain Assessment Pain Assessment: No/denies pain (no c/o pain)    Home Living                          Prior Function            PT Goals (current goals can now be found in the care plan section) Progress towards PT goals: Progressing toward goals    Frequency    Min 2X/week      PT Plan      Co-evaluation              AM-PAC PT "6 Clicks" Mobility   Outcome Measure  Help needed turning from your back to your side while in a flat bed without using bedrails?: None Help needed moving from lying on your back to sitting on the side of a flat bed without using bedrails?: None Help needed moving to and from a bed to a chair (including a wheelchair)?: None Help needed  standing up from a chair using your arms (e.g., wheelchair or bedside chair)?: None Help needed to walk in hospital room?: None Help needed climbing 3-5 steps with a railing? : None 6 Click Score: 24    End of Session   Activity Tolerance: Patient tolerated treatment well Patient left: in bed;with call bell/phone within reach   PT Visit Diagnosis: Difficulty in walking, not elsewhere classified (R26.2)     Time: 1478-2956 PT Time Calculation (min) (ACUTE ONLY): 18 min  Charges:    $Therapeutic Activity: 8-22 mins PT General Charges $$ ACUTE PT VISIT: 1 Visit                    Paulino Door, DPT Physical Therapist Acute Rehabilitation Services Office: [phone]    [name] 02/23/2024, 1:12 PM

## 2024-02-23 NOTE — Progress Notes (Signed)
 PROGRESS NOTE   Ryan Long  [id number]:[id number] DOB: September 05, 1961 DOA: 02/16/2024 PCP: Cristino Martes, NP   Date of Service: the patient was seen and examined on 02/23/2024  Brief Narrative:  63 y.o. male with medical history significant of systolic and diastolic congestive heart failure (Echo 09/2023 EF 20-25% with G1DD)  status post AICD, NSVT, HIV, hypertension, hyperlipidemia, COPD, anxiety, depression, bipolar disorder, substance abuse, BPH, CKD stage IIIa.  Presenting to [facility] emergency department with chest pain and shortness of breath.    Of note, patient was recently admitted by cardiology service 2/5-2/21 for worsening heart failure symptoms and VT storm.  Palliative care was consulted and patient was made DNR/DNI.  Patient had decided to pursue hospice and his ICD was inactivated.  He was discharged to group home with hospice.   Upon evaluation in the emergency department, case was discussed with cardiology and and the hospitalist was called to assess the patient for admission to the hospital.   Cardiology was consulted and patient was placed on a regimen of intravenous Lasix and fluid restriction.  Strict input and output monitoring was performed as well as daily weights.  Throughout the course of the hospitalization patient was also found to be extremely tearful and reports of suicidal ideation.  Psychiatry was consulted and upon their evaluation they additionally agreed that patient's suicidal ideation with plan was concerning.  Initially, plan is for patient to be discharged to a geriatric psychiatry bed but due to his poor performance status they felt the patient was not a candidate.   Assessment & Plan Acute on chronic HFrEF (heart failure with reduced ejection fraction) (HCC) Patient is now capable of ambulating down the hall with without significant difficulty and is able to perform ADLs  Advanced disease with guarded prognosis Patient was recently  transitioned to hospice after previous hospitalization for VT storm and ICD device was turned off as a result Physical exam reveals continued elevated JVP and mild peripheral edema Will attempt to transition patient from intravenous Lasix to oral Lasix therapy as a belief patient is nearing euvolemia.   Continue spironolactone, midodrine for blood pressure support Continuing Mexitil due to recent VT storm  Depression with suicidal ideation Patient is now capable of ambulating down the hall with without significant difficulty and is able to perform ADLs and should be a candidate for geriatric psychiatry floor. Patient continues to complain of feelings of depression with suicidal ideation Case discussed with Dr. Clovis Riley with psychiatry, his service will continue to follow the patient closely throughout the weekend and also repeatedly assess suicidal ideations. Despite patient's guarded long-term prognosis, patient is currently medically cleared to be transferred to behavioral health as well for continued psychiatric care. Goals of care, counseling/discussion Guarded prognosis Since patient's last hospitalization for VT storm, patient was transition to hospice due to his advanced congestive heart failure patient's ICD device has since been shut off HIV (human immunodeficiency virus infection) (HCC) Continue Biktarvy Continue Valtrex due to history of recurrent HSV Outpatient ID follow-up COPD (chronic obstructive pulmonary disease) (HCC) No evidence of COPD exacerbation this time Continue home regimen of maintenance inhalers As needed bronchodilator therapy for episodic shortness of breath and wheezing.  Chest pain Currently chest pain-free  Serial troponins unremarkable. EKGs revealed no evidence of dynamic ST segment change Recent cardiac catheterization performed 12/2023 revealing normal coronaries VQ scan negative for pulmonary embolism Ventricular tachycardia (HCC) Recent  hospitalization in February for VT storm  Continuing Mexitil  continue amiodarone  Monitoring  on telemetry Chronic kidney disease, stage 3a (HCC) Strict intake and output monitoring Creatinine near baseline, currently 1.7 with baseline of 1.5. Minimizing nephrotoxic agents as much as possible Serial chemistries to monitor renal function and electrolytes      Subjective:  Patient complaining of continued feelings of severe depression.  Patient reports continued desire to hurt or kill himself.  Patient states that shortness of breath has resolved. Physical Exam:  Vitals:   02/23/24 0035 02/23/24 0500 02/23/24 0508 02/23/24 0745  BP:  (!) 89/62 (!) 82/66   Pulse:   94   Resp:   20   Temp:   97.8 F (36.6 C)   TempSrc:   Oral   SpO2: 98%  98% 98%  Weight:  76.5 kg    Height:        Constitutional: Awake alert and oriented x3, no associated distress.  Depressed mood with Seidel ideation. Skin: no rashes, no lesions, good skin turgor noted. Eyes: Pupils are equally reactive to light.  No evidence of scleral icterus or conjunctival pallor.  ENMT: Moist mucous membranes noted.  Posterior pharynx clear of any exudate or lesions.   Neck: Continued jugular venous distention at 30 degrees. Respiratory: continued significant rhonchi bilaterally.  Normal respiratory effort. No accessory muscle use.  Cardiovascular: Regular rate and rhythm, no murmurs / rubs / gallops.  +1 distal bilateral lower extremity pitting edema.  2+ pedal pulses. No carotid bruits.  Abdomen: Abdomen is soft and nontender.  No evidence of intra-abdominal masses.  Positive bowel sounds noted in all quadrants.   Musculoskeletal: No joint deformity upper and lower extremities. Good ROM, no contractures. Normal muscle tone.    Data Reviewed:  I have personally reviewed and interpreted labs, imaging.  Significant findings are   CBC: Recent Labs  Lab 02/16/24 1930 02/18/24 0535 02/19/24 0543 02/22/24 0455  02/23/24 0612  WBC 2.7* 2.1* 3.0* 2.5* 2.6*  NEUTROABS  --   --   --   --  1.1*  HGB 18.1* 16.8 16.8 17.6* 16.6  HCT 54.3* 50.2 48.6 50.7 50.3  MCV 100.0 100.2* 96.6 97.1 99.8  PLT 195 161 158 154 162   Basic Metabolic Panel: Recent Labs  Lab 02/18/24 0535 02/19/24 0543 02/20/24 0521 02/21/24 0529 02/22/24 0455 02/23/24 0612  NA 139 138 138 141 140 138  K 3.3* 3.9 3.5 3.8 4.0 3.7  CL 106 106 103 105 103 101  CO2 22 22 25 23 26 27   GLUCOSE 84 87 106* 94 92 92  BUN 18 15 16 12 20 22   CREATININE 1.46* 1.35* 1.64* 1.52* 1.79* 1.70*  CALCIUM 7.7* 7.9* 7.9* 8.6* 8.3* 8.3*  MG 2.1 2.1 2.0 1.9  --  2.1  PHOS  --   --   --   --   --  3.5   GFR: Estimated Creatinine Clearance: 43.6 mL/min (A) (by C-G formula based on SCr of 1.7 mg/dL (H)). Liver Function Tests: Recent Labs  Lab 02/17/24 0615 02/18/24 0535 02/19/24 0543 02/20/24 0521 02/23/24 0612  AST 37 26 28 20 20   ALT 45* 40 34 29 27  ALKPHOS 65 61 61 61 64  BILITOT 2.2* 1.6* 1.9* 1.1 1.7*  PROT 5.6* 4.8* 5.2* 5.2* 5.4*  ALBUMIN 2.9* 2.6* 2.9* 2.7* 2.7*    Coagulation Profile: Recent Labs  Lab 02/17/24 1027  INR 1.2    Code Status:  DNR.  Code status decision has been confirmed with: patient    Severity of Illness:  The  appropriate patient status for this patient is INPATIENT. Inpatient status is judged to be reasonable and necessary in order to provide the required intensity of service to ensure the patient's safety. The patient's presenting symptoms, physical exam findings, and initial radiographic and laboratory data in the context of their chronic comorbidities is felt to place them at high risk for further clinical deterioration. Furthermore, it is not anticipated that the patient will be medically stable for discharge from the hospital within 2 midnights of admission.   * I certify that at the point of admission it is my clinical judgment that the patient will require inpatient hospital care spanning  beyond 2 midnights from the point of admission due to high intensity of service, high risk for further deterioration and high frequency of surveillance required.*  Time spent:  51 minutes  Author:  Marinda Elk MD  02/23/2024 9:37 AM

## 2024-02-23 NOTE — Assessment & Plan Note (Signed)
 Continue Biktarvy Continue Valtrex due to history of recurrent HSV Outpatient ID follow-up

## 2024-02-24 DIAGNOSIS — Z515 Encounter for palliative care: Secondary | ICD-10-CM | POA: Diagnosis not present

## 2024-02-24 DIAGNOSIS — I5023 Acute on chronic systolic (congestive) heart failure: Secondary | ICD-10-CM | POA: Diagnosis not present

## 2024-02-24 DIAGNOSIS — R079 Chest pain, unspecified: Secondary | ICD-10-CM | POA: Diagnosis not present

## 2024-02-24 DIAGNOSIS — N1831 Chronic kidney disease, stage 3a: Secondary | ICD-10-CM | POA: Diagnosis not present

## 2024-02-24 DIAGNOSIS — Z7189 Other specified counseling: Secondary | ICD-10-CM

## 2024-02-24 DIAGNOSIS — I472 Ventricular tachycardia, unspecified: Secondary | ICD-10-CM

## 2024-02-24 DIAGNOSIS — J449 Chronic obstructive pulmonary disease, unspecified: Secondary | ICD-10-CM | POA: Diagnosis not present

## 2024-02-24 LAB — COMPREHENSIVE METABOLIC PANEL
ALT: 25 U/L (ref 0–44)
AST: 21 U/L (ref 15–41)
Albumin: 2.9 g/dL — ABNORMAL LOW (ref 3.5–5.0)
Alkaline Phosphatase: 66 U/L (ref 38–126)
Anion gap: 12 (ref 5–15)
BUN: 20 mg/dL (ref 8–23)
CO2: 25 mmol/L (ref 22–32)
Calcium: 8.5 mg/dL — ABNORMAL LOW (ref 8.9–10.3)
Chloride: 101 mmol/L (ref 98–111)
Creatinine, Ser: 1.53 mg/dL — ABNORMAL HIGH (ref 0.61–1.24)
GFR, Estimated: 51 mL/min — ABNORMAL LOW (ref >60)
Glucose, Bld: 96 mg/dL (ref 70–99)
Potassium: 3.6 mmol/L (ref 3.5–5.1)
Sodium: 138 mmol/L (ref 135–145)
Total Bilirubin: 1.4 mg/dL — ABNORMAL HIGH (ref 0.0–1.2)
Total Protein: 5.9 g/dL — ABNORMAL LOW (ref 6.5–8.1)

## 2024-02-24 LAB — MAGNESIUM: Magnesium: 2.2 mg/dL (ref 1.7–2.4)

## 2024-02-24 MED ORDER — BUTALBITAL-APAP-CAFFEINE 50-325-40 MG PO TABS
1.0000 | ORAL_TABLET | Freq: Four times a day (QID) | ORAL | Status: DC | PRN
  Administered 2024-02-25 – 2024-03-01 (×3): 1 via ORAL
  Filled 2024-02-24 (×3): qty 1

## 2024-02-24 NOTE — Plan of Care (Signed)

## 2024-02-24 NOTE — Assessment & Plan Note (Signed)
 Guarded prognosis Since patient's last hospitalization for VT storm, patient was transition to hospice due to his advanced congestive heart failure patient's ICD device has since been shut off

## 2024-02-24 NOTE — Plan of Care (Signed)

## 2024-02-24 NOTE — Progress Notes (Signed)
 PROGRESS NOTE   Ryan Long  GNF:[id number] DOB: Nov 30, 1961 DOA: 02/16/2024 PCP: Cristino Martes, NP   Date of Service: the patient was seen and examined on 02/24/2024  Brief Narrative:  63 y.o. male with medical history significant of systolic and diastolic congestive heart failure (Echo 09/2023 EF 20-25% with G1DD)  status post AICD (has been off since 01/28/2024), NSVT, HIV, hypertension, hyperlipidemia, COPD, anxiety, depression, bipolar disorder, substance abuse, BPH, CKD stage IIIa.  Presenting to [facility] emergency department with chest pain and shortness of breath.    Of note, patient was recently admitted by cardiology service 2/5-2/21 for worsening heart failure symptoms and VT storm.  Palliative care was consulted and patient was made DNR/DNI.  Patient had decided to pursue hospice and his ICD was inactivated.  He was discharged to group home with hospice.   Upon evaluation in the emergency department, case was discussed with cardiology and and the hospitalist was called to assess the patient for admission to the hospital.   Cardiology was consulted and patient was placed on a regimen of intravenous Lasix and fluid restriction.  Strict input and output monitoring was performed as well as daily weights.  Throughout the course of the hospitalization patient was also found to be extremely tearful and reports of suicidal ideation.  Psychiatry was consulted and upon their evaluation they additionally agreed that patient's suicidal ideation with plan was concerning.  Initially, plan is for patient to be discharged to a geriatric psychiatry bed but due to his poor performance status they felt the patient was not a candidate.   Assessment & Plan Acute on chronic HFrEF (heart failure with reduced ejection fraction) (HCC) Patient is now capable of ambulating moderate distances down the hall to the nurses station without significant difficulty and is able to perform ADLs  I feel  patient's physical condition has plateaued and this is likely his new baseline.  Diuretics have been transitioned to oral therapy on 3/8. Advanced disease with guarded prognosis and severely depressed ejection fraction. Patient was recently transitioned to hospice after previous hospitalization in February for VT storm. ICD was turned off as a result Continue spironolactone, midodrine for blood pressure support Continuing Mexitil due to recent VT storm  Depression with suicidal ideation Patient is now capable of ambulating moderate distances down the hall to the nurses station without significant difficulty and is able to perform ADLs  Patient continues to complain of feelings of depression with suicidal ideation Despite patient's guarded long-term prognosis, patient is currently medically cleared to be transferred to behavioral health as well for continued psychiatric care. Unfortunately, the geriatric psychiatry unit has declined the patient twice Case discussed with Dr. Clovis Riley with psychiatry on 3/7 his service will continue to follow the patient closely and also repeatedly assess suicidal ideations.  Goals of care, counseling/discussion Guarded prognosis Since patient's last hospitalization for VT storm, patient was transitioned to hospice due to his advanced congestive heart failure patient's ICD device has since been shut off HIV (human immunodeficiency virus infection) (HCC) Continue Biktarvy Continue Valtrex due to history of recurrent HSV Outpatient ID follow-up COPD (chronic obstructive pulmonary disease) (HCC) No evidence of COPD exacerbation this time Continue home regimen of maintenance inhalers As needed bronchodilator therapy for episodic shortness of breath and wheezing.  Chest pain Currently chest pain-free  Serial troponins unremarkable. EKGs revealed no evidence of dynamic ST segment change Recent cardiac catheterization performed 12/2023 revealing normal  coronaries VQ scan negative for pulmonary embolism Ventricular tachycardia (HCC)  Recent hospitalization in February for VT storm  Continuing Mexitil  continue amiodarone  Monitoring on telemetry Chronic kidney disease, stage 3a (HCC) Strict intake and output monitoring Creatinine near baseline Minimizing nephrotoxic agents as much as possible Serial chemistries to monitor renal function and electrolytes      Subjective:  Patient complaining of continued feelings of severe depression.  Patient reports continued desire to hurt or kill himself by "jumping in front of a truck."  Patient states that his shortness of breath has resolved.  Physical Exam:  Vitals:   02/23/24 1942 02/24/24 0513 02/24/24 0513 02/24/24 0854  BP: 91/71  93/68   Pulse: 100  90   Resp: 20  20   Temp: 98.1 F (36.7 C)  97.6 F (36.4 C)   TempSrc: Oral  Oral   SpO2: 98%  100% 95%  Weight:  75.7 kg    Height:        Constitutional: Awake alert and oriented x3, no associated distress.  Depressed mood with suicidal ideation. Skin: no rashes, no lesions, good skin turgor noted. Eyes: Pupils are equally reactive to light.  No evidence of scleral icterus or conjunctival pallor.  ENMT: Moist mucous membranes noted.  Posterior pharynx clear of any exudate or lesions.   Neck: Continued jugular venous distention at 30 degrees. Respiratory: Minimal rhonchi at the bases bilaterally.  Normal respiratory effort. No accessory muscle use.  Cardiovascular: Regular rate and rhythm, no murmurs / rubs / gallops.  No significant peripheral edema.  2+ pedal pulses. No carotid bruits.  Abdomen: Abdomen is soft and nontender.  No evidence of intra-abdominal masses.  Positive bowel sounds noted in all quadrants.   Musculoskeletal: No joint deformity upper and lower extremities. Good ROM, no contractures. Normal muscle tone.    Data Reviewed:  I have personally reviewed and interpreted labs, imaging.  Significant findings  are   CBC: Recent Labs  Lab 02/18/24 0535 02/19/24 0543 02/22/24 0455 02/23/24 0612  WBC 2.1* 3.0* 2.5* 2.6*  NEUTROABS  --   --   --  1.1*  HGB 16.8 16.8 17.6* 16.6  HCT 50.2 48.6 50.7 50.3  MCV 100.2* 96.6 97.1 99.8  PLT 161 158 154 162   Basic Metabolic Panel: Recent Labs  Lab 02/19/24 0543 02/20/24 0521 02/21/24 0529 02/22/24 0455 02/23/24 0612 02/24/24 0527  NA 138 138 141 140 138 138  K 3.9 3.5 3.8 4.0 3.7 3.6  CL 106 103 105 103 101 101  CO2 22 25 23 26 27 25   GLUCOSE 87 106* 94 92 92 96  BUN 15 16 12 20 22 20   CREATININE 1.35* 1.64* 1.52* 1.79* 1.70* 1.53*  CALCIUM 7.9* 7.9* 8.6* 8.3* 8.3* 8.5*  MG 2.1 2.0 1.9  --  2.1 2.2  PHOS  --   --   --   --  3.5  --    GFR: Estimated Creatinine Clearance: 48.4 mL/min (A) (by C-G formula based on SCr of 1.53 mg/dL (H)). Liver Function Tests: Recent Labs  Lab 02/18/24 0535 02/19/24 0543 02/20/24 0521 02/23/24 0612 02/24/24 0527  AST 26 28 20 20 21   ALT 40 34 29 27 25   ALKPHOS 61 61 61 64 66  BILITOT 1.6* 1.9* 1.1 1.7* 1.4*  PROT 4.8* 5.2* 5.2* 5.4* 5.9*  ALBUMIN 2.6* 2.9* 2.7* 2.7* 2.9*    Coagulation Profile: Recent Labs  Lab 02/17/24 1027  INR 1.2    Code Status:  DNR.  Code status decision has been confirmed with: patient  Severity of Illness:  The appropriate patient status for this patient is INPATIENT. Inpatient status is judged to be reasonable and necessary in order to provide the required intensity of service to ensure the patient's safety. The patient's presenting symptoms, physical exam findings, and initial radiographic and laboratory data in the context of their chronic comorbidities is felt to place them at high risk for further clinical deterioration. Furthermore, it is not anticipated that the patient will be medically stable for discharge from the hospital within 2 midnights of admission.   * I certify that at the point of admission it is my clinical judgment that the patient will  require inpatient hospital care spanning beyond 2 midnights from the point of admission due to high intensity of service, high risk for further deterioration and high frequency of surveillance required.*  Time spent:  38 minutes  Author:  Marinda Elk MD  02/24/2024 9:16 AM

## 2024-02-24 NOTE — Assessment & Plan Note (Signed)
 Patient is now capable of ambulating moderate distances down the hall to the nurses station without significant difficulty and is able to perform ADLs  Patient continues to complain of feelings of depression with suicidal ideation Despite patient's guarded long-term prognosis, patient is currently medically cleared to be transferred to behavioral health as well for continued psychiatric care. Unfortunately, the geriatric psychiatry unit has declined the patient twice Case discussed with Dr. Clovis Riley with psychiatry on 3/7 his service will continue to follow the patient closely and also repeatedly assess suicidal ideations.

## 2024-02-24 NOTE — Assessment & Plan Note (Signed)
 Strict intake and output monitoring Creatinine near baseline Minimizing nephrotoxic agents as much as possible Serial chemistries to monitor renal function and electrolytes

## 2024-02-24 NOTE — Progress Notes (Signed)
 Occupational Therapy Treatment Patient Details Name: Ryan Long MRN: [id number] DOB: 1961/07/14 Today's Date: 02/24/2024   History of present illness 63 y.o. male Patient presents to the ED complaining of chest pain and shortness of breath, SI. Dx of HFrEF. Pt with medical history significant of chronic HFrEF/end-stage cardiomyopathy status post AICD, NSVT, HIV, hypertension, hyperlipidemia, COPD, anxiety, depression, bipolar disorder, substance abuse, BPH, CKD stage IIIa.  Patient was recently admitted by cardiology service 2/5-2/21 for worsening heart failure symptoms and VT storm.  Palliative care was consulted and patient was made DNR/DNI.  Patient had decided to pursue hospice and his ICD was inactivated.  He was discharged to group home with hospice.   OT comments  OT today focused on Patient learning least 3 energy conservation strategies to employ at home in order to maximize function and quality of life and decrease caregiver burden while preventing exacerbation of symptoms and rehospitalization.  Pt declined OOB mobilization due to fatigue from earlier today but very engaged and responsive to energy conservation education and use of handout to reinforce.  Pt reports plan for next OT session to work on head to toe self care while using energy conservation strategies. Pt also reports that he and his doctor are discussing behavioral health rather than SNF. Pt is ambulating in his room independently with and without his rollator, so discharge recommendations changed to [facility] OT.        If plan is discharge home, recommend the following:  A little help with walking and/or transfers;A little help with bathing/dressing/bathroom;Assistance with cooking/housework;Help with stairs or ramp for entrance   Equipment Recommendations       Recommendations for Other Services      Precautions / Restrictions Precautions Precautions: None Recall of Precautions/Restrictions:  Intact Precaution/Restrictions Comments: monitor O2 Restrictions Weight Bearing Restrictions Per Provider Order: No       Mobility Bed Mobility                    Transfers                         Balance                                           ADL either performed or assessed with clinical judgement   ADL                                         General ADL Comments: Pt in bed and refused OOB activity stating that he has already completed his ADLs and has ambulated today.   Pt agreeable to discussion of energy conservation strategies to support a lifestyle of maintaining a consistent level of energy throughout the day to support function and health. Handout provided and pt/OT discussed the "4 P's" in depth as well as the use of the RPE to help judge when to rest and for how long. Discussed and OT demonstrated use of RW to compensate during IADLs. Pt asked aboutmaking his bed for example, as last time he tried this he reported overexhaustion and staying in bed for 1-2 days afterwards.  OT demonstrated how to analyze an actigvity and break it down in to smaller parts. Option of sitting rather than standing or use  of DME to support EC. Pt very engaged in conversation, asking questions and verbalized understanding to all at end of visit.    Extremity/Trunk Assessment              Vision Ability to See in Adequate Light: 0 Adequate     Perception     Praxis     Communication Communication Communication: No apparent difficulties   Cognition Arousal: Alert Behavior During Therapy: WFL for tasks assessed/performed Cognition: No apparent impairments             OT - Cognition Comments: Pt talking about the future and asking about a transfer to [employer]. No mention of SI this visit                 Following commands: Intact        Cueing      Exercises      Shoulder Instructions        General Comments      Pertinent Vitals/ Pain       Pain Assessment Pain Assessment: Faces Faces Pain Scale: Hurts a little bit Pain Location: headache. Pt reports they are common Pain Descriptors / Indicators: Headache Pain Intervention(s): Limited activity within patient's tolerance, Monitored during session  Home Living                                          Prior Functioning/Environment              Frequency  Min 1X/week        Progress Toward Goals  OT Goals(current goals can now be found in the care plan section)  Progress towards OT goals: Progressing toward goals  Acute Rehab OT Goals Patient Stated Goal: Be able to breath and tolerate ADLs OT Goal Formulation: With patient Time For Goal Achievement: 03/03/24 Potential to Achieve Goals: Good ADL Goals Pt Will Perform Lower Body Bathing: with modified independence Pt Will Perform Lower Body Dressing: with modified independence Pt Will Perform Toileting - Clothing Manipulation and hygiene: with modified independence Pt Will Perform Tub/Shower Transfer: with modified independence;Tub transfer;Shower transfer Additional ADL Goal #1: Patient will identify at least 3 energy conservation strategies to employ at home in order to maximize function and quality of life and decrease caregiver burden while preventing exacerbation of symptoms and rehospitalization.  Plan      Co-evaluation                 AM-PAC OT "6 Clicks" Daily Activity     Outcome Measure   Help from another person eating meals?: None Help from another person taking care of personal grooming?: A Little Help from another person toileting, which includes using toliet, bedpan, or urinal?: A Little Help from another person bathing (including washing, rinsing, drying)?: A Little Help from another person to put on and taking off regular upper body clothing?: A Little Help from another person to put on and taking off regular  lower body clothing?: A Little 6 Click Score: 19    End of Session    OT Visit Diagnosis:  (decreased IADLS)   Activity Tolerance Patient limited by fatigue   Patient Left in bed;with call bell/phone within reach   Nurse Communication          Time: 9604-5409 OT Time Calculation (min): 20 min  Charges: OT General Charges $OT Visit: 1  Visit OT Treatments $Self Care/Home Management : 8-22 mins  Victorino Dike, [place] [facility] Office: [phone] 02/24/2024   Theodoro Clock 02/24/2024, 3:58 PM

## 2024-02-24 NOTE — Progress Notes (Addendum)
 Palliative Medicine Inpatient Follow Up Note HPI: Ryan Long is a 63 y.o. male with medical history significant of chronic HFrEF/end-stage cardiomyopathy status post AICD, NSVT, HIV, hypertension, hyperlipidemia, COPD, anxiety, depression, bipolar disorder, substance abuse, BPH, CKD stage IIIa.     Patient was discharged from [facility] and readmitted with heart failure exacerbation. The PMT has been asked to support additional goals of care conversations.   Today's Discussion 02/24/2024  *Please note that this is a verbal dictation therefore any spelling or grammatical errors are due to the "Dragon Medical One" system interpretation.  Chart reviewed inclusive of vital signs, progress notes, laboratory results, and diagnostic images.   I met with Ryan Long this morning. We had a lengthy conversation about how he has been doing over the past few days. He shares with me that he has been struggling with his present condition. He states that he continues to wonder if jumping in front of a bus would be the best option to decrease pain and suffering. We discussed this. I asked him what if he did not die by jumping in front of a bus? What if it was a prolongation of suffering? I shared that at any point we can certainly focus on his comfort. We talked about transition to comfort measures in house and what that would entail inclusive of medications to control pain, dyspnea, agitation, nausea, itching, and hiccups.  We discussed stopping all uneccessary measures such as cardiac monitoring, blood draws, needle sticks, and frequent vital signs. I shared that this would not be extending his life rather allowing him to pass when his time comes.  Ryan Long reviewed not only the physical discomfort but the emotional and mental pain he struggles with as well. Utilized reflective listening throughout our time together.   [name] notes that he spoke to Dr. Martyn Malay about continuing present care, hospice care, comfort care.  He reviewed the differences between hospice and palliative care.  Underlying all of this Ryan Long does seem to understand that he has a progressive incurable disease. He does vocalize that at any point his heart could stop. This concept scares him. He continued to share the idea that jumping in front of a moving vehicle may be easier than his current reality.   Offer time and space for Ryan Long to express himself.  Questions and concerns addressed/Palliative Support Provided.   Objective Assessment: Vital Signs Vitals:   02/24/24 0513 02/24/24 0854  BP: 93/68   Pulse: 90   Resp: 20   Temp: 97.6 F (36.4 C)   SpO2: 100% 95%    Intake/Output Summary (Last 24 hours) at 02/24/2024 1117 Last data filed at 02/24/2024 0900 Gross per 24 hour  Intake 120 ml  Output 3100 ml  Net -2980 ml   Last Weight  Most recent update: 02/24/2024  5:13 AM    Weight  75.7 kg (166 lb 14.2 oz)            Gen: Older African-American male in no acute distress HEENT: moist mucous membranes CV: Regular rate and rhythm PULM: On RA,  breathing is even and nonlabored ABD: soft/nontender EXT: (+) BLE edema Neuro: Alert and oriented x3  SUMMARY OF RECOMMENDATIONS   DNAR/DNI   [name] remains to have a hard time not only contending with his disease burden but existential stress as well. The chaplain remains involved to support [name].  Reviewed the idea of comfort measures which would alleviate any burdensome suffering patient may experience as a result of his disease. He  continues to consider this.  Ongoing PMT support  Billing based on MDM: High ______________________________________________________________________________________ [name] [facility] Palliative Medicine Team Team Cell Phone: [phone] Please utilize secure chat with additional questions, if there is no response within 30 minutes please call the above phone number  Palliative Medicine Team providers are available by phone from  7am to 7pm daily and can be reached through the team cell phone.  Should this patient require assistance outside of these hours, please call the patient's attending physician.

## 2024-02-24 NOTE — Assessment & Plan Note (Signed)
 Currently chest pain-free  Serial troponins unremarkable. EKGs revealed no evidence of dynamic ST segment change Recent cardiac catheterization performed 12/2023 revealing normal coronaries VQ scan negative for pulmonary embolism

## 2024-02-24 NOTE — Assessment & Plan Note (Signed)
 Patient is now capable of ambulating moderate distances down the hall to the nurses station without significant difficulty and is able to perform ADLs  I feel patient's physical condition has plateaued and this is likely his new baseline.  Diuretics have been transitioned to oral therapy on 3/8. Advanced disease with guarded prognosis and severely depressed ejection fraction. Patient was recently transitioned to hospice after previous hospitalization in February for VT storm. ICD was turned off as a result Continue spironolactone, midodrine for blood pressure support Continuing Mexitil due to recent VT storm

## 2024-02-24 NOTE — Assessment & Plan Note (Signed)
   No evidence of COPD exacerbation this time  Continue home regimen of maintenance inhalers  As needed bronchodilator therapy for episodic shortness of breath and wheezing.  

## 2024-02-24 NOTE — Assessment & Plan Note (Signed)
 Continue Biktarvy Continue Valtrex due to history of recurrent HSV Outpatient ID follow-up

## 2024-02-24 NOTE — Assessment & Plan Note (Signed)
 Recent hospitalization in February for VT storm  Continuing Mexitil  continue amiodarone  Monitoring on telemetry

## 2024-02-25 DIAGNOSIS — Z515 Encounter for palliative care: Secondary | ICD-10-CM | POA: Diagnosis not present

## 2024-02-25 DIAGNOSIS — Z7189 Other specified counseling: Secondary | ICD-10-CM | POA: Diagnosis not present

## 2024-02-25 DIAGNOSIS — I5023 Acute on chronic systolic (congestive) heart failure: Secondary | ICD-10-CM | POA: Diagnosis not present

## 2024-02-25 NOTE — Progress Notes (Signed)
 Per referral from Ryan Duster, RN on palliative team and my own assessed needs, I met with [name] for ongoing support and discussion of goals.  Ryan Long was resting and does not endorse SI to me. He expressed hope to not receive ongoing psychiatric support if possible but has sense that [facility] unlikely due to medical needs. He hopes for an opportunity to have sense of freedom (eg, use of his scooter) but not to return to current sober living house.  I provided compassionate presence and active listening. I continue to encourage Ryan Long to express his preferences and goals for his care.  Will continue to follow. Ryan Long, M.Div [phone]

## 2024-02-25 NOTE — Progress Notes (Signed)
 Progress Note   Patient: Ryan Long XBJ:[id number] DOB: 1961-12-03 DOA: 02/16/2024     7 DOS: the patient was seen and examined on 02/25/2024   Brief hospital course: 63 y.o. male with medical history significant of systolic and diastolic congestive heart failure (Echo 09/2023 EF 20-25% with G1DD)  status post AICD (has been off since 01/28/2024), NSVT, HIV, hypertension, hyperlipidemia, COPD, anxiety, depression, bipolar disorder, substance abuse, BPH, CKD stage IIIa.  Presenting to [facility] emergency department with chest pain and shortness of breath.    Of note, patient was recently admitted by cardiology service 2/5-2/21 for worsening heart failure symptoms and VT storm.  Palliative care was consulted and patient was made DNR/DNI.  Patient had decided to pursue hospice and his ICD was inactivated.  He was discharged to group home with hospice.   Upon evaluation in the emergency department, case was discussed with cardiology and and the hospitalist was called to assess the patient for admission to the hospital.   Cardiology was consulted and patient was placed on a regimen of intravenous Lasix and fluid restriction.  Strict input and output monitoring was performed as well as daily weights.  Psychiatry was consulted and upon their evaluation they additionally agreed that patient's suicidal ideation with plan was concerning.  Assessment and Plan:  Acute on chronic HFrEF -Advanced disease with guarded prognosis and severely depressed ejection fraction. Patient was recently transitioned to hospice after previous hospitalization in February for VT storm. ICD was turned off as a result.  Previously evaluated by cardiology, placed on IV diuretics.  Appears to be showing improvement.  Patient is now capable of ambulating moderate distances down the hall to the nurses station without significant difficulty and is able to perform ADLs.  Continue spironolactone, midodrine for blood  pressure support.  Continuing Mexitil due to recent VT storm  Chronic kidney disease, stage 3a  -Creatinine appears near baseline.  Monitor urine output and recheck BMP in AM.  Chest pain -Currently chest pain-free.  Troponins unremarkable.  Ventricular tachycardia -Recent hospitalization in February for VT storm.  Continuing Mexitil, amiodarone.  Continues on telemetry.  COPD -Does not appear to be in exacerbation.  Nebulizers as needed.  HIV - Continue Biktarvy, Valtrex due to recurrent HSV.  Follows with ID in the outpatient setting.  Depression with suicidal ideation -Continues to describe how he would rather "jump in front of a bus".  Psychiatry consulted and following closely.  Patient is now capable of ambulating moderate distances down the hall to the nurses station without significant difficulty and is able to perform ADLs.  Despite patient's guarded long-term prognosis, patient is currently medically cleared to be transferred to behavioral health as well for continued psychiatric care.  Unfortunately, the geriatric psychiatry unit has declined the patient twice.  Closely with psychiatry and case management on disposition planning.  Close of care - Medically cleared at this point, awaiting decision for behavioral health placement.  Working closely with psych, patient, case management on disposition planning.       Subjective: Patient sitting up in bed this morning, states he is feeling mildly improved.  Denies any chest pain, worsening shortness of breath, fever, chills, nausea, vomiting.  Noted to be ambulating as well.  Continues to still feel overwhelmed and burned and has thoughts and ideas of self-harm including "jumping out in front of a truck".  Physical Exam: Vitals:   02/25/24 0435 02/25/24 0900 02/25/24 1034 02/25/24 1257  BP: 98/74 94/75 (!) 86/67 95/72  Pulse: 100 100  98  Resp: 18 18  18   Temp: 98.3 F (36.8 C)   97.6 F (36.4 C)  TempSrc: Oral   Oral   SpO2: 96% 100%  97%  Weight:      Height:       GENERAL:  Alert, pleasant  HEENT:  EOMI CARDIOVASCULAR:  RRR, no murmurs appreciated RESPIRATORY:  Clear to auscultation, no wheezing, rales, or rhonchi GASTROINTESTINAL:  Soft, nontender, nondistended EXTREMITIES:  No LE edema bilaterally NEURO:  No new focal deficits appreciated SKIN:  No rashes noted PSYCH: Depressed  Data Reviewed:  There are no new results to review at this time.  Family Communication: None at bedside  Disposition: Status is: Inpatient Remains inpatient appropriate because: Psychiatric behavioral health disposition planning  Planned Discharge Destination: Barriers to discharge: Behavioral health    Time spent: 38 minutes  Author: Deanna Artis, DO 02/25/2024 1:27 PM  For on call review [email or web address].

## 2024-02-25 NOTE — Progress Notes (Signed)
   Palliative Medicine Inpatient Follow Up Note HPI: Ryan Long is a 63 y.o. male with medical history significant of chronic HFrEF/end-stage cardiomyopathy status post AICD, NSVT, HIV, hypertension, hyperlipidemia, COPD, anxiety, depression, bipolar disorder, substance abuse, BPH, CKD stage IIIa.     Patient was discharged from [facility] and readmitted with heart failure exacerbation. The PMT has been asked to support additional goals of care conversations.   Today's Discussion 02/25/2024  *Please note that this is a verbal dictation therefore any spelling or grammatical errors are due to the "Dragon Medical One" system interpretation.  Chart reviewed inclusive of vital signs, progress notes, laboratory results, and diagnostic images.   I met with Ryan Long at bedside this morning. He was resting comfortably and not noted to be in any distress. He shares that his intrusive thoughts remain. He is having a tough time grappling with his present diease and long term outcomes. He asked me again about Palliative versus Hospice care. We were able to review the differences between the two.  At this time Ryan Long is hopeful that he can go to behavioral health for additional management of his mental/behavioral symptoms.   We discussed that at this time, Ryan Long would like to continue present measures. He is not at a point whereby he wants is to purely focus on symptoms at end of life.   Questions and concerns addressed/Palliative Support Provided.   Objective Assessment: Vital Signs Vitals:   02/25/24 0900 02/25/24 1034  BP: 94/75 (!) 86/67  Pulse: 100   Resp: 18   Temp:    SpO2: 100%     Intake/Output Summary (Last 24 hours) at 02/25/2024 1251 Last data filed at 02/24/2024 1856 Gross per 24 hour  Intake --  Output 600 ml  Net -600 ml   Last Weight  Most recent update: 02/24/2024  5:13 AM    Weight  75.7 kg (166 lb 14.2 oz)            Gen: Older African-American male in no acute  distress HEENT: moist mucous membranes CV: Regular rate and rhythm PULM: On RA,  breathing is even and nonlabored ABD: soft/nontender EXT: (-) edema Neuro: Alert and oriented x3  SUMMARY OF RECOMMENDATIONS   DNAR/DNI   Ryan Long remains to have a hard time not only contending with his disease burden but existential stress as well. The chaplain remains involved to support Ryan Long.  Awaiting placement at [facility]  Ongoing PMT support  Billing based on MDM: High ______________________________________________________________________________________ Ryan Long [facility] Palliative Medicine Team Team Cell Phone: [phone] Please utilize secure chat with additional questions, if there is no response within 30 minutes please call the above phone number  Palliative Medicine Team providers are available by phone from 7am to 7pm daily and can be reached through the team cell phone.  Should this patient require assistance outside of these hours, please call the patient's attending physician.
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
## 2024-02-25 NOTE — TOC Progression Note (Signed)
 Transition of Care [facility]) - Progression Note    Patient Details  Name: Anais Koenen MRN: [id number] Date of Birth: Apr 24, 1961  [facility]) CM/SW Contact  Desarie Feild, Olegario Messier, RN Phone Number: 02/25/2024, 1:35 PM  Clinical Narrative:  Noted for Inpt Psych-faxed out to inpt psych.[facility] not able to accept;also noted Dr to Dr referral needed for [facility] if Dr chose to refer.    Expected Discharge Plan: Psychiatric Hospital Barriers to Discharge: Continued Medical Work up  Expected Discharge Plan and Services       Living arrangements for the past 2 months: Boarding House ([employer])                                       Social Determinants of Health (SDOH) Interventions SDOH Screenings   Food Insecurity: No Food Insecurity (02/17/2024)  Housing: Low Risk  (02/17/2024)  Transportation Needs: No Transportation Needs (02/17/2024)  Utilities: Not At Risk (02/17/2024)  Alcohol Screen: Low Risk  (07/17/2023)  Depression (PHQ2-9): Low Risk  (08/01/2023)  Financial Resource Strain: Low Risk  (07/17/2023)  Physical Activity: Insufficiently Active (07/17/2023)  Social Connections: Moderately Integrated (07/17/2023)  Stress: Stress Concern Present (07/17/2023)  Tobacco Use: High Risk (02/16/2024)  Health Literacy: Adequate Health Literacy (07/18/2023)    Readmission Risk Interventions     No data to display

## 2024-02-25 NOTE — Progress Notes (Addendum)
 PT Cancellation Note  Patient Details Name: Ryan Long MRN: [id number] DOB: 1961/04/10   Cancelled Treatment:    Reason Eval/Treat Not Completed:  Attempted PT tx session-pt declined to partiicpate with PT. He reports he walked to and from nursing station with his rollator on his own. Will check back one more time-if pt continues to decline participation with PT will likely sign off. *Addendum* observed pt ambulating in hallway on his own with his rollator shortly after declining to work with therapy.Faye Ramsay, PT Acute Rehabilitation  Office: [phone]

## 2024-02-25 NOTE — Progress Notes (Signed)
 Patient feeling short of breath with slight increased work of breathing and some wheezing when I listened to his lungs, administered evening dose of lasix and requested PRN breathing treatment.

## 2024-02-26 DIAGNOSIS — I5023 Acute on chronic systolic (congestive) heart failure: Secondary | ICD-10-CM | POA: Diagnosis not present

## 2024-02-26 DIAGNOSIS — Z7189 Other specified counseling: Secondary | ICD-10-CM | POA: Diagnosis not present

## 2024-02-26 DIAGNOSIS — Z515 Encounter for palliative care: Secondary | ICD-10-CM | POA: Diagnosis not present

## 2024-02-26 NOTE — Progress Notes (Signed)
 Occupational Therapy Treatment Patient Details Name: Ryan Long MRN: [id number] DOB: 07/06/1961 Today's Date: 02/26/2024   History of present illness Ryan Long is a 63 y.o. male admitted 02/16/2024 with chest pain. And expressed SI while admitted. Of note, the patient was recently discharged from cardiology service where he was admitted due to HF exacerbation and VT storm. While he was recommended to go to SNF, the patient declined the option and decided to pursue hospice and went back to group home. PMH includes: Depression and substance use disorder, chronic HFrEF/end-stage cardiomyopathy status post AICD, NSVT, HIV, hypertension, hyperlipidemia, COPD, BPH, CKD stage IIIa .   OT comments  Pt is progressing towards OT goals this session. Pt agreeable and pleasant, overall aupervision/GCA for transfers and OOB activities (sink level grooming). Pt able to walk in hallway with Rollator - demonstrating good hand placement and lock use. Pt expressed concerns about "losing his breath" during functional tasks - like showering. Reviewed energy conservation handout from previous OT session. OT will continue to follow acutely.       If plan is discharge home, recommend the following:  A little help with walking and/or transfers;A little help with bathing/dressing/bathroom;Assistance with cooking/housework;Help with stairs or ramp for entrance   Equipment Recommendations  Other (comment) (defer to next venue of care)    Recommendations for Other Services PT consult    Precautions / Restrictions Precautions Precautions: None Recall of Precautions/Restrictions: Intact Precaution/Restrictions Comments: monitor O2 Restrictions Weight Bearing Restrictions Per Provider Order: No       Mobility Bed Mobility Overal bed mobility: Modified Independent Bed Mobility: Supine to Sit     Supine to sit: Supervision     General bed mobility comments: supervision for line management  and extra time/effort required. HOB elevated and use of rail to assist with trunk elevation    Transfers Overall transfer level: Modified independent Equipment used: Rollator (4 wheels) Transfers: Sit to/from Stand Sit to Stand: Supervision           General transfer comment: endorses dizziness, fear of "not being able to breathe"     Balance Overall balance assessment: Mild deficits observed, not formally tested                                         ADL either performed or assessed with clinical judgement   ADL Overall ADL's : Needs assistance/impaired     Grooming: Standing;Oral care;Wash/dry face;Contact guard assist Grooming Details (indicate cue type and reason): sink level         Upper Body Dressing : Minimal assistance Upper Body Dressing Details (indicate cue type and reason): donning new gown     Toilet Transfer: Supervision/safety;Ambulation (decreased balance)           Functional mobility during ADLs: Supervision/safety;Rollator (4 wheels) (pushing IV pole) General ADL Comments: reviewed energy conservation with Pt, as Pt's biggest fear is "trying to do something like showering and running out of breath"    Extremity/Trunk Assessment              Vision   Vision Assessment?: No apparent visual deficits   Perception     Praxis     Communication Communication Communication: No apparent difficulties   Cognition Arousal: Alert Behavior During Therapy: WFL for tasks assessed/performed Cognition: No apparent impairments             OT -  Cognition Comments: no mention of SI, enjoyed jokes and talking about ways to have fun (old pin-ball games/arcades) talked about how it is hard for him to engage in self-care because he gets anxious about it and worries "what if i can't breathe"                 Following commands: Intact        Cueing   Cueing Techniques: Verbal cues, Tactile cues  Exercises       Shoulder Instructions       General Comments after hallway mobility Pt HR 103, SpO2 92 on RA, no auditory SOB    Pertinent Vitals/ Pain       Pain Assessment Pain Assessment: No/denies pain Pain Intervention(s): Monitored during session, Repositioned  Home Living                                          Prior Functioning/Environment              Frequency  Min 1X/week        Progress Toward Goals  OT Goals(current goals can now be found in the care plan section)  Progress towards OT goals: Progressing toward goals  Acute Rehab OT Goals Patient Stated Goal: increase activity tolerance OT Goal Formulation: With patient Time For Goal Achievement: 03/03/24 Potential to Achieve Goals: Good ADL Goals Pt Will Perform Lower Body Bathing: with modified independence Pt Will Perform Lower Body Dressing: with modified independence Pt Will Perform Toileting - Clothing Manipulation and hygiene: with modified independence Pt Will Perform Tub/Shower Transfer: with modified independence;Tub transfer;Shower transfer Additional ADL Goal #1: Patient will identify at least 3 energy conservation strategies to employ at home in order to maximize function and quality of life and decrease caregiver burden while preventing exacerbation of symptoms and rehospitalization.  Plan      Co-evaluation                 AM-PAC OT "6 Clicks" Daily Activity     Outcome Measure   Help from another person eating meals?: None Help from another person taking care of personal grooming?: A Little Help from another person toileting, which includes using toliet, bedpan, or urinal?: A Little Help from another person bathing (including washing, rinsing, drying)?: A Little Help from another person to put on and taking off regular upper body clothing?: A Little Help from another person to put on and taking off regular lower body clothing?: A Little 6 Click Score: 19    End of  Session Equipment Utilized During Treatment: Gait belt  OT Visit Diagnosis: Muscle weakness (generalized) (M62.81) (cardiopulmonary status limiting activity)   Activity Tolerance Patient tolerated treatment well   Patient Left in bed;with call bell/phone within reach   Nurse Communication Mobility status        Time: 4098-1191 OT Time Calculation (min): 23 min  Charges: OT General Charges $OT Visit: 1 Visit OT Treatments $Self Care/Home Management : 8-22 mins $Therapeutic Activity: 8-22 mins  Nyoka Cowden OTR/L Acute Rehabilitation Services Office: [phone]  Evern Bio [facility] 02/26/2024, 12:04 PM

## 2024-02-26 NOTE — Progress Notes (Signed)
   Palliative Medicine Inpatient Follow Up Note HPI: Ryan Long is a 63 y.o. male with medical history significant of chronic HFrEF/end-stage cardiomyopathy status post AICD, NSVT, HIV, hypertension, hyperlipidemia, COPD, anxiety, depression, bipolar disorder, substance abuse, BPH, CKD stage IIIa.     Patient was discharged from [facility] and readmitted with heart failure exacerbation. The PMT has been asked to support additional goals of care conversations.   Today's Discussion 02/26/2024  *Please note that this is a verbal dictation therefore any spelling or grammatical errors are due to the "Dragon Medical One" system interpretation.  Chart reviewed inclusive of vital signs, progress notes, laboratory results, and diagnostic images.   I met with Ryan Long at bedside this morning. We had a long conversation about his present circumstances. He notes that he continues to have invasive thoughts. He is considering where he could possibly live after behavioral health. He shares that he does have money to afford an apartment. He notes that he cannot go back to his sober living as his best interests are not in mind while there. Ryan Long had lived in an apartment for a long time prior to the sober living though it had apparently been problematic as there was a significant mold problem them.   Lumir asked to speak to the case management team to discuss other living options if he neglects to get to the behavioral health facility prior to improvement of symptoms.  Ryan Long and I reviewed that as of now he remains to be aggressively treated for his SI.   Questions and concerns addressed/Palliative Support Provided.   Objective Assessment: Vital Signs Vitals:   02/26/24 0903 02/26/24 1012  BP:  96/77  Pulse:    Resp:  18  Temp:    SpO2: 99% 99%    Intake/Output Summary (Last 24 hours) at 02/26/2024 1057 Last data filed at 02/26/2024 1000 Gross per 24 hour  Intake 360 ml  Output 875 ml  Net -515 ml    Last Weight  Most recent update: 02/26/2024  4:34 AM    Weight  75.7 kg (166 lb 14.2 oz)            Gen: Older African-American male in no acute distress HEENT: moist mucous membranes CV: Regular rate and rhythm PULM: On RA,  breathing is even and nonlabored ABD: soft/nontender EXT: (-) edema Neuro: Alert and oriented x3  SUMMARY OF RECOMMENDATIONS   DNAR/DNI   Jazon remains to have a hard time not only contending with his disease burden but existential stress as well. The chaplain remains involved to support Ryan Long.  Awaiting placement at [facility] though if patient improved prior he is hoping to learn more about alternative placements. I have reached out to [facility] to speak with him further.   Ongoing PMT support  Billing based on MDM: High ______________________________________________________________________________________ Ryan Long [facility] Palliative Medicine Team Team Cell Phone: [phone] Please utilize secure chat with additional questions, if there is no response within 30 minutes please call the above phone number  Palliative Medicine Team providers are available by phone from 7am to 7pm daily and can be reached through the team cell phone.  Should this patient require assistance outside of these hours, please call the patient's attending physician.

## 2024-02-26 NOTE — Progress Notes (Signed)
 RN called for PRN neb treatment. RT is currently with PT on 4W that cannot be left unattended. RT asked RN to provide her PT with PRN treatment if possible (otherwise to call RT back).

## 2024-02-26 NOTE — Progress Notes (Signed)
   02/25/24 2103  Assess: MEWS Score  Temp (!) 97.5 F (36.4 C)  BP 92/69  MAP (mmHg) 78  Pulse Rate (!) 107  Resp 17  SpO2 90 %  Assess: MEWS Score  MEWS Temp 0  MEWS Systolic 1  MEWS Pulse 1  MEWS RR 0  MEWS LOC 0  MEWS Score 2  MEWS Score Color Yellow  Assess: if the MEWS score is Yellow or Red  Were vital signs accurate and taken at a resting state? Yes  Does the patient meet 2 or more of the SIRS criteria? Yes  Does the patient have a confirmed or suspected source of infection? No  MEWS guidelines implemented  Yes, yellow  Treat  MEWS Interventions Considered administering scheduled or prn medications/treatments as ordered  Take Vital Signs  Increase Vital Sign Frequency  Yellow: Q2hr x1, continue Q4hrs until patient remains green for 12hrs  Escalate  MEWS: Escalate Yellow: Discuss with charge nurse and consider notifying provider and/or RRT  Assess: SIRS CRITERIA  SIRS Temperature  0  SIRS Respirations  0  SIRS Pulse 1  SIRS WBC 0  SIRS Score Sum  1

## 2024-02-26 NOTE — Progress Notes (Signed)
   02/26/24 1430  Spiritual Encounters  Type of Visit Follow up;Attempt (pt unavailable)   I attempted to make a follow up visit for ongoing support. If Mr. Michalski asks, please let him know that I came by.  I also spoke with Marcelino Duster, RN (palliative care) regarding needs and make my support available to the care team.  Herbert Aguinaldo L. Sophronia Simas, M.Div [phone]

## 2024-02-26 NOTE — TOC Progression Note (Addendum)
 Transition of Care [facility]) - Progression Note    Patient Details  Name: Ryan Long MRN: [id number] Date of Birth: 04-28-1961  [facility]) CM/SW Contact  Megha Agnes, Olegario Messier, RN Phone Number: 02/26/2024, 11:18 AM  Clinical Narrative: Per psych note they r recc inpt psych. patient has been faxed out no offers yet. also noted a doctor to dr referral for [facility] the outcome. Until that is resolved I dont have any TOC options for the next level for the patient.I can only provide the resource patient would have to research another recovery house on his own. Recovery house resources added to AVS.   -11:29a spoke to [facility] rep [name]-patient has a rm for return if he Marion Downer will call patient to address any concerns. -1:19p-Received message from [facility]-no beds available/no waitlist can call daily.rep [facility].    Expected Discharge Plan: Psychiatric Hospital Barriers to Discharge: Continued Medical Work up  Expected Discharge Plan and Services       Living arrangements for the past 2 months: Boarding House ([employer])                                       Social Determinants of Health (SDOH) Interventions SDOH Screenings   Food Insecurity: No Food Insecurity (02/17/2024)  Housing: Low Risk  (02/17/2024)  Transportation Needs: No Transportation Needs (02/17/2024)  Utilities: Not At Risk (02/17/2024)  Alcohol Screen: Low Risk  (07/17/2023)  Depression (PHQ2-9): Low Risk  (08/01/2023)  Financial Resource Strain: Low Risk  (07/17/2023)  Physical Activity: Insufficiently Active (07/17/2023)  Social Connections: Moderately Integrated (07/17/2023)  Stress: Stress Concern Present (07/17/2023)  Tobacco Use: High Risk (02/16/2024)  Health Literacy: Adequate Health Literacy (07/18/2023)    Readmission Risk Interventions     No data to display

## 2024-02-26 NOTE — Progress Notes (Signed)
 Progress Note   Patient: Ryan Long WUJ:[id number] DOB: [birth year] DOA: 02/16/2024     8 DOS: the patient was seen and examined on 02/26/2024   Brief hospital course: 63 y.o. male with medical history significant of systolic and diastolic congestive heart failure (Echo 09/2023 EF 20-25% with G1DD)  status post AICD (has been off since 01/28/2024), NSVT, HIV, hypertension, hyperlipidemia, COPD, anxiety, depression, bipolar disorder, substance abuse, BPH, CKD stage IIIa.  Presenting to [facility] emergency department with chest pain and shortness of breath.    Of note, patient was recently admitted by cardiology service 2/5-2/21 for worsening heart failure symptoms and VT storm.  Palliative care was consulted and patient was made DNR/DNI.  Patient had decided to pursue hospice and his ICD was inactivated.  He was discharged to group home with hospice.   Upon evaluation in the emergency department, case was discussed with cardiology and and the hospitalist was called to assess the patient for admission to the hospital.   Cardiology was consulted and patient was placed on a regimen of intravenous Lasix and fluid restriction.  Strict input and output monitoring was performed as well as daily weights.  Psychiatry was consulted and upon their evaluation they additionally agreed that patient's suicidal ideation with plan was concerning.  Assessment and Plan:  Acute on chronic HFrEF -Advanced disease with guarded prognosis and severely depressed ejection fraction. Patient was recently transitioned to hospice after previous hospitalization in February for VT storm. ICD was turned off as a result.  Previously evaluated by cardiology, placed on IV diuretics.  Appears to be showing improvement.  Patient is now capable of ambulating moderate distances down the hall to the nurses station without significant difficulty and is able to perform ADLs.  Continue spironolactone, midodrine for blood  pressure support.  Continuing Mexitil due to recent VT storm.  Appears to be at baseline.  Chronic kidney disease, stage 3a  -Creatinine appears near baseline.  Monitor urine output and recheck BMP in AM.  Chest pain -Currently chest pain-free.  Troponins unremarkable.  Ventricular tachycardia -Recent hospitalization in February for VT storm.  Continuing Mexitil, amiodarone.  Continues on telemetry.  COPD -Does not appear to be in exacerbation.  Nebulizers as needed.  HIV - Continue Biktarvy, Valtrex due to recurrent HSV.  Follows with ID in the outpatient setting.  Depression with suicidal ideation -Continues to describe how he would rather "jump in front of a bus".  Psychiatry consulted and following closely.  Patient is now capable of ambulating moderate distances down the hall to the nurses station without significant difficulty and is able to perform ADLs.  Despite patient's guarded long-term prognosis, patient is currently medically cleared to be transferred to behavioral health as well for continued psychiatric care.  Unfortunately, the geriatric psychiatry unit has declined the patient twice.  Closely with psychiatry and case management on disposition planning.  Close of care - Medically cleared at this point, awaiting decision for behavioral health placement.  Working closely with psych, patient, case management on disposition planning.       Subjective: Patient sitting up in bed this morning, states he is feeling mildly improved.  Denies any chest pain, worsening shortness of breath, fever, chills, nausea, vomiting.  Noted to be ambulating as well.  Continues to still feel overwhelmed and burdened.  Physical Exam: Vitals:   02/26/24 0352 02/26/24 0433 02/26/24 0903 02/26/24 1012  BP: 94/76   96/77  Pulse: 90     Resp: 17  18  Temp: 97.8 F (36.6 C)     TempSrc: Oral     SpO2: 99%  99% 99%  Weight:  75.7 kg    Height:       GENERAL:  Alert, pleasant  HEENT:   EOMI CARDIOVASCULAR:  RRR, no murmurs appreciated RESPIRATORY:  Clear to auscultation, no wheezing, rales, or rhonchi GASTROINTESTINAL:  Soft, nontender, nondistended EXTREMITIES:  No LE edema bilaterally NEURO:  No new focal deficits appreciated SKIN:  No rashes noted PSYCH: Depressed  Data Reviewed:  There are no new results to review at this time.  Family Communication: None at bedside  Disposition: Status is: Inpatient Remains inpatient appropriate because: Psychiatric behavioral health disposition planning  Planned Discharge Destination: Barriers to discharge: Behavioral health    Time spent: 32 minutes  Author: Deanna Artis, DO 02/26/2024 11:39 AM  For on call review [email or web address].

## 2024-02-27 DIAGNOSIS — Z515 Encounter for palliative care: Secondary | ICD-10-CM | POA: Diagnosis not present

## 2024-02-27 DIAGNOSIS — I5023 Acute on chronic systolic (congestive) heart failure: Secondary | ICD-10-CM | POA: Diagnosis not present

## 2024-02-27 MED ORDER — SERTRALINE HCL 100 MG PO TABS
100.0000 mg | ORAL_TABLET | Freq: Every day | ORAL | Status: DC
  Administered 2024-02-28 – 2024-03-07 (×9): 100 mg via ORAL
  Filled 2024-02-27 (×9): qty 1

## 2024-02-27 MED ORDER — ARIPIPRAZOLE 5 MG PO TABS
7.5000 mg | ORAL_TABLET | Freq: Every day | ORAL | Status: DC
  Administered 2024-02-28 – 2024-03-07 (×9): 7.5 mg via ORAL
  Filled 2024-02-27 (×9): qty 2

## 2024-02-27 NOTE — TOC Progression Note (Addendum)
 Transition of Care [facility]) - Progression Note    Patient Details  Name: Ryan Long MRN: [id number] Date of Birth: [birth year]  Transition [facility]) CM/SW Contact  Jacey Eckerson, Olegario Messier, RN Phone Number: 02/27/2024, 12:20 PM  Clinical Narrative: patient has been faxed out to all IP Psych facilities-received no bed offers from any of more than 3-contacted [facility] spoke to [place] to place on waitlist-patient has been there in past. Faxed info to [phone].await outcome. -4p-patient has rollator in rm already;informed patient of being on [facility] wait list since no beds for IP psych. [facility] may [facility] if clears-he is in agreement. .      Expected Discharge Plan: Psychiatric Hospital Barriers to Discharge: Continued Medical Work up  Expected Discharge Plan and Services       Living arrangements for the past 2 months: Boarding House ([employer])                                       Social Determinants of Health (SDOH) Interventions SDOH Screenings   Food Insecurity: No Food Insecurity (02/17/2024)  Housing: Low Risk  (02/17/2024)  Transportation Needs: No Transportation Needs (02/17/2024)  Utilities: Not At Risk (02/17/2024)  Alcohol Screen: Low Risk  (07/17/2023)  Depression (PHQ2-9): Low Risk  (08/01/2023)  Financial Resource Strain: Low Risk  (07/17/2023)  Physical Activity: Insufficiently Active (07/17/2023)  Social Connections: Moderately Integrated (07/17/2023)  Stress: Stress Concern Present (07/17/2023)  Tobacco Use: High Risk (02/16/2024)  Health Literacy: Adequate Health Literacy (07/18/2023)    Readmission Risk Interventions     No data to display
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
## 2024-02-27 NOTE — Progress Notes (Addendum)
   Palliative Medicine Inpatient Follow Up Note HPI: Ryan Long is a 63 y.o. male with medical history significant of chronic HFrEF/end-stage cardiomyopathy status post AICD, NSVT, HIV, hypertension, hyperlipidemia, COPD, anxiety, depression, bipolar disorder, substance abuse, BPH, CKD stage IIIa.     Patient was discharged from [facility] and readmitted with heart failure exacerbation. The PMT has been asked to support additional goals of care conversations.   Today's Discussion 02/27/2024  *Please note that this is a verbal dictation therefore any spelling or grammatical errors are due to the "Dragon Medical One" system interpretation.  Chart reviewed inclusive of vital signs, progress notes, laboratory results, and diagnostic images.   I met with Ryan Long at bedside this morning. He is in good spirits and shares his SI is not "constant" but coming and going at this time. He shares that he had spoken to his landlord this morning, "Rhae Hammock" who is trying to decide what to do with patients things if Ryan Long determines he does not desire going back to his sober living home. We discussed the most important item at this time is Ryan Long's scooter. Ryan Long shares he really would prefer not to go back to the sober living he was at.   Reviewed that patient feels overall some improvements. He denies symptoms burden this morning.   Questions and concerns addressed/Palliative Support Provided.   Objective Assessment: Vital Signs Vitals:   02/27/24 0508 02/27/24 1000  BP: 90/73 93/65  Pulse: 93   Resp: 18 18  Temp: 97.6 F (36.4 C)   SpO2: 97%     Intake/Output Summary (Last 24 hours) at 02/27/2024 1118 Last data filed at 02/27/2024 1610 Gross per 24 hour  Intake 480 ml  Output --  Net 480 ml   Last Weight  Most recent update: 02/27/2024  5:34 AM    Weight  74.2 kg (163 lb 9.3 oz)            Gen: Older African-American male in no acute distress HEENT: moist mucous membranes CV: Regular rate  and rhythm PULM: On RA,  breathing is even and nonlabored ABD: soft/nontender EXT: (-) edema Neuro: Alert and oriented x3  SUMMARY OF RECOMMENDATIONS   DNAR/DNI   Appreciate ongoing chaplain support  Awaiting placement at [facility] though if patient improved prior he is hoping to learn more about alternative placements. I have reached out to [facility] to speak with him further.   Ongoing PMT support  Time: 51 ______________________________________________________________________________________ [name]  Palliative Medicine Team Team Cell Phone: [phone] Please utilize secure chat with additional questions, if there is no response within 30 minutes please call the above phone number  Palliative Medicine Team providers are available by phone from 7am to 7pm daily and can be reached through the team cell phone.  Should this patient require assistance outside of these hours, please call the patient's attending physician.

## 2024-02-27 NOTE — Progress Notes (Signed)
 Mobility Specialist - Progress Note   02/27/24 1336  Mobility  Activity Ambulated independently in hallway  Level of Assistance Independent after set-up  Assistive Device Four wheel walker  Distance Ambulated (ft) 180 ft  Activity Response Tolerated well  Mobility Referral Yes  Mobility visit 1 Mobility   Pt c/o SOB during session, but measured 92% SpO2 on RA. Pt returned to room at EOS and was left in bed with all needs in reach.   Arliss Journey Mobility Specialist Acute Rehabilitation Services Phone: [phone] 02/27/24, 1:38 PM

## 2024-02-27 NOTE — Progress Notes (Signed)
   02/27/24 1050  Spiritual Encounters  Type of Visit Follow up  Care provided to: Patient  Conversation partners present during encounter Psychologist  Reason for visit Routine spiritual support  Spiritual Framework  Presenting Themes Significant life change;Goals in life/care   Met at length with Mr Dubie to help him process ongoing feelings around EOL, changing goals of care and needs, and what he would like to choose going forward.  Mr Hendon had hoped for ongoing psychiatric support but this does not seem to be an option for futher inpatient care. He expressed clear preference to not return to current housing (sober living house). He processed with me the challenge of adapting to alternate choice of SNF.  I provided compassionate presence and both active and reflective listening. I invited and encouraged patient's own empowerment to choose and also to locate the positive within next steps. We also acknowledged feelings of uncertainty but I invited reframing and resisting creating narratives that are not reality-based, focusing instead on healthy goals and supports available.  Will follow patient and connect with care team. Shameeka Silliman L. Sophronia Simas, M.Div [phone]

## 2024-02-27 NOTE — Progress Notes (Signed)
 Progress Note   Patient: Ryan Long [id number]:[id number] DOB: May 20, 1961 DOA: 02/16/2024     9 DOS: the patient was seen and examined on 02/27/2024   Brief hospital course: 63 y.o. male with medical history significant of systolic and diastolic congestive heart failure (Echo 09/2023 EF 20-25% with G1DD)  status post AICD (has been off since 01/28/2024), NSVT, HIV, hypertension, hyperlipidemia, COPD, anxiety, depression, bipolar disorder, substance abuse, BPH, CKD stage IIIa.  Presenting to [facility] emergency department with chest pain and shortness of breath.    Of note, patient was recently admitted by cardiology service 2/5-2/21 for worsening heart failure symptoms and VT storm.  Palliative care was consulted and patient was made DNR/DNI.  Patient had decided to pursue hospice and his ICD was inactivated.  He was discharged to group home with hospice.   Upon evaluation in the emergency department, case was discussed with cardiology and and the hospitalist was called to assess the patient for admission to the hospital.   Cardiology was consulted and patient was placed on a regimen of intravenous Lasix and fluid restriction.  Strict input and output monitoring was performed as well as daily weights.  Psychiatry was consulted and upon their evaluation they additionally agreed that patient's suicidal ideation with plan was concerning.  Assessment and Plan:  Acute on chronic HFrEF -Advanced disease with guarded prognosis and severely depressed ejection fraction. Patient was recently transitioned to hospice after previous hospitalization in February for VT storm. ICD was turned off as a result.  Previously evaluated by cardiology, placed on IV diuretics.  Appears to be showing improvement.  Patient is now capable of ambulating moderate distances down the hall to the nurses station without significant difficulty and is able to perform ADLs.  -Continue lasix, spironolactone, midodrine for  blood pressure support.   -Continuing Mexitil, amiodarone due to recent VT storm.    Depression with suicidal ideation -Continues to describe how he would rather not be here, as patient states that knowing the he could die any moment from his medical condition weighs heavily on him. He is tearful at times. Patient is medically stable to be transferred to a [facility] unit.   Chronic kidney disease, stage 3a  -F/u BMP in the AM     Latest Ref Rng & Units 02/24/2024    5:27 AM 02/23/2024    6:12 AM 02/22/2024    4:55 AM  BMP  Glucose 70 - 99 mg/dL 96  92  92   BUN 8 - 23 mg/dL 20  22  20    Creatinine 0.61 - 1.24 mg/dL 4.69  6.29  5.28   Sodium 135 - 145 mmol/L 138  138  140   Potassium 3.5 - 5.1 mmol/L 3.6  3.7  4.0   Chloride 98 - 111 mmol/L 101  101  103   CO2 22 - 32 mmol/L 25  27  26    Calcium 8.9 - 10.3 mg/dL 8.5  8.3  8.3     Chest pain Resolved   Ventricular tachycardia Resolved  -Recent hospitalization in February for VT storm.  Continuing Mexitil, amiodarone.  Continues on telemetry.  COPD -Does not appear to be in exacerbation.  Nebulizers as needed.  HIV - Continue Biktarvy, Valtrex due to recurrent HSV.  Follows with ID in the outpatient setting.  - Medically cleared at this point, awaiting decision for behavioral health placement.  Working closely with psych, patient, case management on disposition planning.       Subjective:  Patient remains overwhelmed with his medical condition. He notes that he has no family support currently. He does continue to have some SI when he thinks about his medical condition.   Physical Exam: Vitals:   02/27/24 0508 02/27/24 1000 02/27/24 1419 02/27/24 1504  BP: 90/73 93/65 103/80   Pulse: 93  (!) 115   Resp: 18 18 18    Temp: 97.6 F (36.4 C)  (!) 97.4 F (36.3 C)   TempSrc: Oral  Oral   SpO2: 97%  100% 99%  Weight:      Height:       GENERAL:  Alert, pleasant  HEENT:  EOMI CARDIOVASCULAR:  RRR, no murmurs  appreciated RESPIRATORY:  Clear to auscultation, no wheezing, rales, or rhonchi GASTROINTESTINAL:  Soft, nontender, nondistended EXTREMITIES:  No LE edema bilaterally NEURO:  No new focal deficits appreciated SKIN:  No rashes noted PSYCH: Depressed  Physical Exam  Constitutional: Tearful at times  Cardiovascular: Normal rate, regular rhythm. No lower extremity edema  Pulmonary: Non labored breathing on room air, no wheezing or rales.  Abdominal: Soft. Normal bowel sounds. Non distended and non tender Musculoskeletal: Normal range of motion.     Neurological: Alert and oriented to person, place, and time. Non focal  Skin: Skin is warm and dry.  Psych: Depressed   Data Reviewed:  There are no new results to review at this time.  Family Communication: None at bedside  Disposition: Status is: Inpatient Remains inpatient appropriate because: Psychiatric behavioral health disposition planning  Planned Discharge Destination: Barriers to discharge: Behavioral health    Time spent: 32 minutes  Author: Marolyn Haller, MD 02/27/2024 5:11 PM  For on call review [email or web address].

## 2024-02-28 DIAGNOSIS — I5023 Acute on chronic systolic (congestive) heart failure: Secondary | ICD-10-CM | POA: Diagnosis not present

## 2024-02-28 LAB — COMPREHENSIVE METABOLIC PANEL
ALT: 33 U/L (ref 0–44)
AST: 30 U/L (ref 15–41)
Albumin: 3 g/dL — ABNORMAL LOW (ref 3.5–5.0)
Alkaline Phosphatase: 79 U/L (ref 38–126)
Anion gap: 12 (ref 5–15)
BUN: 29 mg/dL — ABNORMAL HIGH (ref 8–23)
CO2: 25 mmol/L (ref 22–32)
Calcium: 8.7 mg/dL — ABNORMAL LOW (ref 8.9–10.3)
Chloride: 101 mmol/L (ref 98–111)
Creatinine, Ser: 1.78 mg/dL — ABNORMAL HIGH (ref 0.61–1.24)
GFR, Estimated: 43 mL/min — ABNORMAL LOW (ref >60)
Glucose, Bld: 97 mg/dL (ref 70–99)
Potassium: 4.1 mmol/L (ref 3.5–5.1)
Sodium: 138 mmol/L (ref 135–145)
Total Bilirubin: 1.2 mg/dL (ref 0.0–1.2)
Total Protein: 5.9 g/dL — ABNORMAL LOW (ref 6.5–8.1)

## 2024-02-28 LAB — MAGNESIUM: Magnesium: 2.4 mg/dL (ref 1.7–2.4)

## 2024-02-28 MED ORDER — SPIRONOLACTONE 12.5 MG HALF TABLET
12.5000 mg | ORAL_TABLET | Freq: Every day | ORAL | Status: DC
  Administered 2024-02-28: 12.5 mg via ORAL
  Filled 2024-02-28: qty 1

## 2024-02-28 NOTE — Progress Notes (Signed)
 Progress Note   Patient: Ryan Long ZOX:[id number] DOB: August 26, 1961 DOA: 02/16/2024     10 DOS: the patient was seen and examined on 02/28/2024   Brief hospital course: 63 y.o. male with medical history significant of systolic and diastolic congestive heart failure (Echo 09/2023 EF 20-25% with G1DD)  status post AICD (has been off since 01/28/2024), NSVT, HIV, hypertension, hyperlipidemia, COPD, anxiety, depression, bipolar disorder, substance abuse, BPH, CKD stage IIIa.  Presenting to [facility] emergency department with chest pain and shortness of breath.    Of note, patient was recently admitted by cardiology service 2/5-2/21 for worsening heart failure symptoms and VT storm.  Palliative care was consulted and patient was made DNR/DNI.  Patient had decided to pursue hospice and his ICD was inactivated.  He was discharged to group home with hospice.   Upon evaluation in the emergency department, case was discussed with cardiology and and the hospitalist was called to assess the patient for admission to the hospital.   Cardiology was consulted and patient was placed on a regimen of intravenous Lasix and fluid restriction.  Strict input and output monitoring was performed as well as daily weights.  Psychiatry was consulted and upon their evaluation they additionally agreed that patient's suicidal ideation with plan was concerning.  Assessment and Plan:  Acute on chronic HFrEF Advanced disease with guarded prognosis and severely depressed ejection fraction.  Patient was recently transitioned to hospice after previous hospitalization in February for VT storm. ICD was turned off as a result.  Previously evaluated by cardiology, placed on IV diuretics.  Appears to be euvolemic and is able to ambulate without difficulty.  His heart failure medications are limited by his blood pressures -Hold Lasix, and spironolactone.  Continue midodrine for blood pressure support.   -Continuing  Mexitil, amiodarone due to recent VT storm.  May need to decrease amiodarone due to low blood pressures.  Depression with suicidal ideation -Patient in improved spirits this a.m. Patient is medically stable to be transferred to a [facility] unit.   Chronic kidney disease, stage 3a  Serum creatinine primarily between 1.5-1.8 Will hold diuresis as patient appears euvolemic and has low blood pressures.  Will continue to monitor every other day.     Latest Ref Rng & Units 02/28/2024    9:41 AM 02/24/2024    5:27 AM 02/23/2024    6:12 AM  BMP  Glucose 70 - 99 mg/dL 97  96  92   BUN 8 - 23 mg/dL 29  20  22    Creatinine 0.61 - 1.24 mg/dL 8.11  9.14  7.82   Sodium 135 - 145 mmol/L 138  138  138   Potassium 3.5 - 5.1 mmol/L 4.1  3.6  3.7   Chloride 98 - 111 mmol/L 101  101  101   CO2 22 - 32 mmol/L 25  25  27    Calcium 8.9 - 10.3 mg/dL 8.7  8.5  8.3     Chest pain Resolved   Ventricular tachycardia Resolved  -Recent hospitalization in February for VT storm.  Continuing Mexitil, amiodarone.  Continues on telemetry.  COPD -Does not appear to be in exacerbation.  Nebulizers as needed.  HIV - Continue Biktarvy, Valtrex due to recurrent HSV.  Follows with ID in the outpatient setting.  - Medically cleared at this point, awaiting decision for behavioral health placement.  Working closely with psych, patient, case management on disposition planning.       Subjective: Patient in improved spirits  this PM. Has no chest pain or SOB. IS able to ambulate without difficulty.  Physical Exam: Vitals:   02/28/24 0516 02/28/24 0738 02/28/24 0810 02/28/24 1414  BP: (!) 82/67  (!) 88/70 (!) 81/58  Pulse: 94 (!) 102 98 92  Resp: 18 18 20 18   Temp: 97.8 F (36.6 C)  (!) 97.3 F (36.3 C) (!) 97.4 F (36.3 C)  TempSrc: Oral  Oral Oral  SpO2: 99% 92% 91% 95%  Weight:      Height:       Physical Exam  Constitutional: In no distress.  Cardiovascular: Normal rate, regular rhythm. No lower extremity edema   Pulmonary: Non labored breathing on room air, no wheezing or rales.   Abdominal: Soft. Normal bowel sounds. Non distended and non tender Musculoskeletal: Normal range of motion.     Neurological: Alert and oriented to person, place, and time. Non focal  Skin: Skin is warm and dry.    Data Reviewed:  There are no new results to review at this time.  Family Communication: None at bedside  Disposition: Status is: Inpatient Remains inpatient appropriate because: Psychiatric behavioral health disposition planning  Planned Discharge Destination: Barriers to discharge: Behavioral health    Time spent: 25 minutes  Author: Marolyn Haller, MD 02/28/2024 4:11 PM  For on call review [email or web address].

## 2024-02-28 NOTE — Progress Notes (Signed)
 Physical Therapy Discharge Patient Details Name: Ryan Long MRN: [id number] DOB: May 16, 1961 Today's Date: 02/28/2024 Time:  -     Patient discharged from PT services secondary to goals met and no further PT needs identified.  Please see latest therapy progress note for current level of functioning and progress toward goals.    Pt has been mobilizing independently and with mobility specialist.  Pt has rollator in room for use upon d/c for rest breaks as needed.       Janan Halter [place] 02/28/2024, 3:40 PM  Paulino Door, DPT Physical Therapist Acute Rehabilitation Services Office: [phone]

## 2024-02-29 DIAGNOSIS — I5023 Acute on chronic systolic (congestive) heart failure: Secondary | ICD-10-CM | POA: Diagnosis not present

## 2024-02-29 LAB — BASIC METABOLIC PANEL
Anion gap: 11 (ref 5–15)
BUN: 28 mg/dL — ABNORMAL HIGH (ref 8–23)
CO2: 25 mmol/L (ref 22–32)
Calcium: 8.7 mg/dL — ABNORMAL LOW (ref 8.9–10.3)
Chloride: 103 mmol/L (ref 98–111)
Creatinine, Ser: 1.74 mg/dL — ABNORMAL HIGH (ref 0.61–1.24)
GFR, Estimated: 44 mL/min — ABNORMAL LOW (ref >60)
Glucose, Bld: 76 mg/dL (ref 70–99)
Potassium: 4.1 mmol/L (ref 3.5–5.1)
Sodium: 139 mmol/L (ref 135–145)

## 2024-02-29 MED ORDER — AMIODARONE HCL 200 MG PO TABS
200.0000 mg | ORAL_TABLET | Freq: Every day | ORAL | Status: DC
  Administered 2024-03-01 – 2024-03-07 (×7): 200 mg via ORAL
  Filled 2024-02-29 (×7): qty 1

## 2024-02-29 MED ORDER — FUROSEMIDE 40 MG PO TABS
40.0000 mg | ORAL_TABLET | ORAL | Status: DC
  Administered 2024-03-02 – 2024-03-06 (×3): 40 mg via ORAL
  Filled 2024-02-29 (×6): qty 1

## 2024-02-29 NOTE — Plan of Care (Signed)
   Medical records reviewed including progress notes, labs and imaging. Patient is gradually improving with unclear placement upon discharge.  PMT will follow peripherally at this time. Please do not hesitate to reach out for additional needs.   Thank you for your referral and allowing PMT to assist in [name]'s care.   Richardson Dopp, [facility] Palliative Medicine Team  Team Phone # [phone]   NO CHARGE

## 2024-02-29 NOTE — Consult Note (Addendum)
 [facility] Liaison Note  02/29/2024  Ryan Long [facility] 12-09-1961 [id number] Covering [facility])  Location: [facility] Liaison screened the patient remotely at [facility].  Insurance: [employer]   Ryan Long is a 63 y.o. male who is a Primary Care Patient of Cristino Martes, NP [facility]).The patient was screened for 30 day readmission hospitalization with noted high risk score for unplanned readmission risk with 3 IP/2 ED in 6 months.   The patient was assessed for potential Care Management service needs for post hospital transition for care coordination. Review of patient's electronic medical record reveals patient was admitted for CHF. Liaison perform chart review due to green banner. This provider not affiliated and does not participate with VBCI services at this time.   VBCI Care Management/Population Health does not replace or interfere with any arrangements made by the Inpatient Transition of Care team.   For questions contact:   Elliot Cousin, RN, BSN Hospital Liaison [place]   [facility], Population Health Office Hours MTWF  8:00 am-6:00 pm Direct Dial: [phone] mobile [email or web address]
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
## 2024-02-29 NOTE — Progress Notes (Signed)
 Occupational Therapy Treatment Patient Details Name: Ryan Long MRN: [id number] DOB: 1961-09-12 Today's Date: 02/29/2024   History of present illness 63 y.o. male Patient presents to the ED complaining of chest pain and shortness of breath, SI. Dx of HFrEF. Pt with medical history significant of chronic HFrEF/end-stage cardiomyopathy status post AICD, NSVT, HIV, hypertension, hyperlipidemia, COPD, anxiety, depression, bipolar disorder, substance abuse, BPH, CKD stage IIIa.  Patient was recently admitted by cardiology service 2/5-2/21 for worsening heart failure symptoms and VT storm.  Palliative care was consulted and patient was made DNR/DNI.  Patient had decided to pursue hospice and his ICD was inactivated.  He was discharged to group home with hospice.   OT comments  Patient visited by Occupational Therapy with no further acute OT needs identified per the patient after addressing safety and options for grab bars for pt's tub/shower as well as review of energy conservation concepts. Pt only demonstrated independent bed mobility for supine to sit for lunch and otherwise refused to demonstrate OOB mobility, reporting that he is Modified independent with all functional bathroom mobility with his rollator.   All education has been completed with handouts to reinforce education, and the patient has no further questions.  See below for any follow-up Occupational Therapy or equipment needs. OT is signing off. Thank you for this referral.       If plan is discharge home, recommend the following:  Assistance with cooking/housework   Equipment Recommendations  None recommended by OT    Recommendations for Other Services      Precautions / Restrictions Precautions Precautions: None Recall of Precautions/Restrictions: Intact       Mobility Bed Mobility Overal bed mobility: Modified Independent                  Transfers                         Balance Overall  balance assessment: Mild deficits observed, not formally tested                                         ADL either performed or assessed with clinical judgement   ADL Overall ADL's : At baseline                                       General ADL Comments: Pt with tub/shower at home and grab bar inside tub only. Pt endorsed that he would have to reach outside his COG to reach and may be unsafe. Pt educated on professionally installed grab bars vs clamp on tub bar and pt given handout per his request of clamp on with verbal and written list of vendors.  Pt verbalized understanding and refused to practice tub transfered even simulated in room. Pt reported, "I got it". OT reviewed all energy conservation concepts formally discussed with pt and pt engaged saying, "That's right, I forgot that. Pt encouraged to refer to his handout PRN for reinforcement.    Extremity/Trunk Assessment Upper Extremity Assessment Upper Extremity Assessment: Overall WFL for tasks assessed   Lower Extremity Assessment Lower Extremity Assessment: Overall WFL for tasks assessed   Cervical / Trunk Assessment Cervical / Trunk Assessment: Normal    Vision       Perception  Praxis     Communication Communication Communication: No apparent difficulties   Cognition Arousal: Alert Behavior During Therapy: WFL for tasks assessed/performed Cognition: No apparent impairments             OT - Cognition Comments: Pt tends to do things when he wants to do them.  Pt agrees that he no longer has need of OT.                 Following commands: Intact        Cueing      Exercises      Shoulder Instructions       General Comments      Pertinent Vitals/ Pain       Pain Assessment Pain Assessment: No/denies pain  Home Living                                          Prior Functioning/Environment              Frequency  Other (comment)  (OT to sign off.)        Progress Toward Goals  OT Goals(current goals can now be found in the care plan section)  Progress towards OT goals: Goals met/education completed, patient discharged from OT  Acute Rehab OT Goals OT Goal Formulation: All assessment and education complete, DC therapy  Plan      Co-evaluation                 AM-PAC OT "6 Clicks" Daily Activity     Outcome Measure   Help from another person eating meals?: None Help from another person taking care of personal grooming?: None Help from another person toileting, which includes using toliet, bedpan, or urinal?: None Help from another person bathing (including washing, rinsing, drying)?: A Little (Recommended supervision and clamp on vs professionally installed grab bar for safety. Pt agreeable.) Help from another person to put on and taking off regular upper body clothing?: None Help from another person to put on and taking off regular lower body clothing?: None 6 Click Score: 23    End of Session    OT Visit Diagnosis:  (cardiopulmonary status affecting activity to[place].)   Activity Tolerance Patient tolerated treatment well   Patient Left with call bell/phone within reach (Sittnig EOB with lunch after max encouragement to sit up and eat.)   Nurse Communication          Time: 6578-4696 OT Time Calculation (min): 18 min  Charges: OT Treatments $Self Care/Home Management : 8-22 mins  Victorino Dike, OT Acute Rehab Services Office: [phone] 02/29/2024   Theodoro Clock 02/29/2024, 12:19 PM

## 2024-02-29 NOTE — Progress Notes (Signed)
 Progress Note   Patient: Ryan Long [id number]:[id number] DOB: 05-29-1961 DOA: 02/16/2024     11 DOS: the patient was seen and examined on 02/29/2024   Brief hospital course: 63 y.o. male with medical history significant of systolic and diastolic congestive heart failure (Echo 09/2023 EF 20-25% with G1DD)  status post AICD (has been off since 01/28/2024), NSVT, HIV, hypertension, hyperlipidemia, COPD, anxiety, depression, bipolar disorder, substance abuse, BPH, CKD stage IIIa.  [facility] to [facility] department with chest pain and shortness of breath.    Of note, patient was recently admitted by cardiology service 2/5-2/21 for worsening heart failure symptoms and VT storm.  Palliative care was consulted and patient was made DNR/DNI.  Patient had decided to pursue hospice and his ICD was inactivated.  He was discharged to group home with hospice.   Upon evaluation in the emergency department, case was discussed with cardiology and and the hospitalist was called.  Cardiology was consulted and patient was placed on a regimen of intravenous Lasix and fluid restriction.  Strict input and output monitoring was performed as well as daily weights.  Psychiatry was consulted and upon their evaluation they additionally agreed that patient's suicidal ideation with plan was concerning.  Assessment and Plan:  Acute on chronic HFrEF Advanced disease with severely depressed ejection fraction and guarded prognosis.   Patient was recently transitioned to hospice after hospitalization in February for VT storm. ICD was turned off as a result.  Presented back to [facility] shortly after with dyspnea and was found to be in acute exacerbation of his chronic HF. He was diuresed. Currently, he is euvolemic but his heart failure medications are limited due to his soft blood pressures. Patient is requiring pharmacological support with midodrine.  -Continue to hold Lasix, and spironolactone.    -Continue midodrine for blood pressure support.   -Continuing Mexitil, amiodarone due to recent VT storm.  Will decreased amiodarone to once daily dosing given hypotension  Depression with suicidal ideation -Patient in good spirits this PM. He remains medically stable  to be transferred to a [facility] unit.   Chronic kidney disease, stage 3a  Serum creatinine primarily between 1.5-1.8, stable this AM.  Appears euvolemic will continue to hold lasix Will continue to monitor every other day.     Latest Ref Rng & Units 02/29/2024    7:25 AM 02/28/2024    9:41 AM 02/24/2024    5:27 AM  BMP  Glucose 70 - 99 mg/dL 76  97  96   BUN 8 - 23 mg/dL 28  29  20    Creatinine 0.61 - 1.24 mg/dL 5.36  6.44  0.34   Sodium 135 - 145 mmol/L 139  138  138   Potassium 3.5 - 5.1 mmol/L 4.1  4.1  3.6   Chloride 98 - 111 mmol/L 103  101  101   CO2 22 - 32 mmol/L 25  25  25    Calcium 8.9 - 10.3 mg/dL 8.7  8.7  8.5     Chest pain Resolved   Ventricular tachycardia Resolved  -Recent hospitalization in February for VT storm.  Continuing Mexitil. Reduce amiodarone to once daily dosing   COPD -Does not appear to be in exacerbation.  Nebulizers as needed.  HIV - Continue Biktarvy, Valtrex due to recurrent HSV.  Follows with ID in the outpatient setting.  - Medically cleared at this point, awaiting decision for behavioral health placement.  Working closely with psych, patient, case management on disposition planning.  Subjective: Patient recently came back from walking and was short of breath.  Mild respiratory distress.  His symptoms improved after a few minutes of rest. He denies any chest pain.   Physical Exam: Vitals:   02/29/24 1257 02/29/24 1313 02/29/24 1600 02/29/24 1620  BP: (!) 86/72 90/69 90/62    Pulse: 93 94    Resp: 20 20 18    Temp: (!) 97.4 F (36.3 C) 98 F (36.7 C)    TempSrc:  Oral    SpO2: 96% 98%  95%  Weight:      Height:       Physical Exam  Constitutional: In no  distress.  Cardiovascular: Normal rate, regular rhythm. No lower extremity edema  Pulmonary: Non labored breathing on room air, no wheezing or rales.   Abdominal: Soft. Normal bowel sounds. Non distended and non tender Musculoskeletal: Normal range of motion.     Neurological: Alert and oriented to person, place, and time. Non focal  Skin: Skin is warm and dry.    Data Reviewed:  There are no new results to review at this time.  Family Communication: None at bedside  Disposition: Status is: Inpatient Remains inpatient appropriate because: Psychiatric behavioral health disposition planning  Planned Discharge Destination: Barriers to discharge: Behavioral health    Time spent: 25 minutes  Author: Marolyn Haller, MD 02/29/2024 5:07 PM  For on call review [email or web address].

## 2024-02-29 NOTE — TOC Progression Note (Signed)
 Transition of Care [facility]) - Progression Note    Patient Details  Name: Ryan Long MRN: [id number] Date of Birth: [birth year]  [facility] CM/SW Contact  Rosealie Reach, Olegario Messier, RN Phone Number: 02/29/2024, 12:06 PM  Clinical Narrative: Patient still on [facility] wait list spoke to Kim-no bed offers for IP Psych-voluntary. Patient still has a rm @ [facility] recovery [facility]-spoke to [employer] agree for return if appropriate, has a home dme- rollator already in rm. Continue to monitor & asst with d/c needs.      Expected Discharge Plan: Psychiatric Hospital Barriers to Discharge: Continued Medical Work up  Expected Discharge Plan and Services       Living arrangements for the past 2 months: Boarding House ([employer] [employer])                                       Social Determinants of Health (SDOH) Interventions SDOH Screenings   Food Insecurity: No Food Insecurity (02/17/2024)  Housing: Low Risk  (02/17/2024)  Transportation Needs: No Transportation Needs (02/17/2024)  Utilities: Not At Risk (02/17/2024)  Alcohol Screen: Low Risk  (07/17/2023)  Depression (PHQ2-9): Low Risk  (08/01/2023)  Financial Resource Strain: Low Risk  (07/17/2023)  Physical Activity: Insufficiently Active (07/17/2023)  Social Connections: Moderately Integrated (07/17/2023)  Stress: Stress Concern Present (07/17/2023)  Tobacco Use: High Risk (02/16/2024)  Health Literacy: Adequate Health Literacy (07/18/2023)    Readmission Risk Interventions     No data to display

## 2024-03-01 ENCOUNTER — Other Ambulatory Visit: Payer: Self-pay | Admitting: Cardiology

## 2024-03-01 DIAGNOSIS — I5023 Acute on chronic systolic (congestive) heart failure: Secondary | ICD-10-CM | POA: Diagnosis not present

## 2024-03-01 NOTE — Progress Notes (Signed)
 PROGRESS NOTE    Ryan Long  GNF:[id number] DOB: 05/31/1961 DOA: 02/16/2024 PCP: Cristino Martes, NP   Brief Narrative: 63 y.o. male with medical history significant of systolic and diastolic congestive heart failure (Echo 09/2023 EF 20-25% with G1DD)  status post AICD (has been off since 01/28/2024), NSVT, HIV, hypertension, hyperlipidemia, COPD, anxiety, depression, bipolar disorder, substance abuse, BPH, CKD stage IIIa.  Presenting to [facility] emergency department with chest pain and shortness of breath.    Of note, patient was recently admitted by cardiology service 2/5-2/21 for worsening heart failure symptoms and VT storm.  Palliative care was consulted and patient was made DNR/DNI.  Patient had decided to pursue hospice and his ICD was inactivated.  He was discharged to group home with hospice.   Upon evaluation in the emergency department, case was discussed with cardiology and and the hospitalist was called to assess the patient for admission to the hospital.   Cardiology was consulted and patient was placed on a regimen of intravenous Lasix and fluid restriction.  Strict input and output monitoring was performed as well as daily weights.  Throughout the course of the hospitalization patient was also found to be extremely tearful and repeatedly resported suicidal ideation.  Psychiatry was consulted and upon their evaluation they additionally agreed that patient's suicidal ideation with plan was concerning.  Initially, plan was for patient to be discharged to a geriatric psychiatry bed but after consideration they felt the patient was not a candidate.   Assessment and Plan:  Acute on chronic HFrEF Patient with severely decreased LVEF measuring 20-25% with global hypokinesis and grade 1 diastolic dysfunction. Patient treated with Lasix IV. No hypoxia. Lasix IV transitioned to oral formulation. Goal directed medical therapy not possible secondary to chronic  hypotension. -Continue furosemide -Continue Jardiance  Chronic hypotension Likely related to low-output heart failure. Patient is managed on midodrine. -Continue midodrine  Depression Suicidal ideation Patient seen and evaluated by psychiatry. Plan to discharge to behavioral health. -Continue Abilify, Klonopin and Zoloft  CKD stage IIIa Creatinine stable.  Ventricular tachycardia Likely related to heart failure and severely reduced LVEF. Patient is s/p St. Jude single chamber ICD which was turned off at previous admission secondary to transition to hospice after VT storm. -Continue Mexitil and amiodarone  COPD -Continue Breo Ellipta and albuterol  HIV -Continue Biktarvy   DVT prophylaxis: Heparin subq Code Status:   Code Status: Limited: Do not attempt resuscitation (DNR) -DNR-LIMITED -Do Not Intubate/DNI  Family Communication: None at bedside Disposition Plan: Discharge to behavioral health once bed is available   Consultants:  Psychiatry Palliative care medicine  Procedures:    Antimicrobials:     Subjective: Patient reports some dyspnea with exertion. Episode of chest pain last night that resolved spontaneously after one minute.  Objective: BP 94/73 (BP Location: Left Arm)   Pulse 99   Temp 97.6 F (36.4 C) (Oral)   Resp 16   Ht 5\' 8"  (1.727 m)   Wt 75.3 kg   SpO2 98%   BMI 25.24 kg/m   Examination:  General exam: Appears calm and comfortable Respiratory system: End expiratory wheezing. Respiratory effort normal. Cardiovascular system: S2 heard, diminished S1 heard. RRR. Systolic murmur. Gastrointestinal system: Abdomen is nondistended, soft and nontender. No organomegaly or masses felt. Normal bowel sounds heard. Central nervous system: Alert and oriented. No focal neurological deficits. Musculoskeletal: No edema. No calf tenderness Skin: No cyanosis. No rashes Psychiatry: Judgement and insight appear normal. Mood & affect appropriate.  Data Reviewed: I have personally reviewed following labs and imaging studies  CBC Lab Results  Component Value Date   WBC 2.6 (L) 02/23/2024   RBC 5.04 02/23/2024   HGB 16.6 02/23/2024   HCT 50.3 02/23/2024   MCV 99.8 02/23/2024   MCH 32.9 02/23/2024   PLT 162 02/23/2024   MCHC 33.0 02/23/2024   RDW 14.8 02/23/2024   LYMPHSABS 1.0 02/23/2024   MONOABS 0.4 02/23/2024   EOSABS 0.0 02/23/2024   BASOSABS 0.0 02/23/2024     Last metabolic panel Lab Results  Component Value Date   NA 139 02/29/2024   K 4.1 02/29/2024   CL 103 02/29/2024   CO2 25 02/29/2024   BUN 28 (H) 02/29/2024   CREATININE 1.74 (H) 02/29/2024   GLUCOSE 76 02/29/2024   GFRNONAA 44 (L) 02/29/2024   GFRAA 79 04/20/2021   CALCIUM 8.7 (L) 02/29/2024   PHOS 3.5 02/23/2024   PROT 5.9 (L) 02/28/2024   ALBUMIN 3.0 (L) 02/28/2024   LABGLOB 1.5 09/06/2021   AGRATIO 2.6 (H) 09/06/2021   BILITOT 1.2 02/28/2024   ALKPHOS 79 02/28/2024   AST 30 02/28/2024   ALT 33 02/28/2024   ANIONGAP 11 02/29/2024    GFR: Estimated Creatinine Clearance: 42.6 mL/min (A) (by C-G formula based on SCr of 1.74 mg/dL (H)).  No results found for this or any previous visit (from the past 240 hours).    Radiology Studies: No results found.    LOS: 12 days    Jacquelin Hawking, MD [facility] 03/01/2024, 1:30 PM   If 7PM-7AM, please contact night-coverage [email or web address]

## 2024-03-01 NOTE — Plan of Care (Signed)
  Problem: Coping: Goal: Level of anxiety will decrease Outcome: Progressing   Problem: Pain Managment: Goal: General experience of comfort will improve and/or be controlled Outcome: Progressing   Problem: Safety: Goal: Ability to remain free from injury will improve Outcome: Progressing   Problem: Skin Integrity: Goal: Risk for impaired skin integrity will decrease Outcome: Progressing

## 2024-03-02 DIAGNOSIS — I5023 Acute on chronic systolic (congestive) heart failure: Secondary | ICD-10-CM | POA: Diagnosis not present

## 2024-03-02 NOTE — Progress Notes (Signed)
 PROGRESS NOTE    Ryan Long  [id number]:[id number] DOB: October 31, 1961 DOA: 02/16/2024 PCP: Cristino Martes, NP   Brief Narrative: 63 y.o. male with medical history significant of systolic and diastolic congestive heart failure (Echo 09/2023 EF 20-25% with G1DD)  status post AICD (has been off since 01/28/2024), NSVT, HIV, hypertension, hyperlipidemia, COPD, anxiety, depression, bipolar disorder, substance abuse, BPH, CKD stage IIIa.  Presenting to [facility] emergency department with chest pain and shortness of breath.    Of note, patient was recently admitted by cardiology service 2/5-2/21 for worsening heart failure symptoms and VT storm.  Palliative care was consulted and patient was made DNR/DNI.  Patient had decided to pursue hospice and his ICD was inactivated.  He was discharged to group home with hospice.   Upon evaluation in the emergency department, case was discussed with cardiology and and the hospitalist was called to assess the patient for admission to the hospital.   Cardiology was consulted and patient was placed on a regimen of intravenous Lasix and fluid restriction.  Strict input and output monitoring was performed as well as daily weights.  Throughout the course of the hospitalization patient was also found to be extremely tearful and repeatedly resported suicidal ideation.  Psychiatry was consulted and upon their evaluation they additionally agreed that patient's suicidal ideation with plan was concerning.  Initially, plan was for patient to be discharged to a geriatric psychiatry bed but after consideration they felt the patient was not a candidate.   Assessment and Plan:  Acute on chronic HFrEF Patient with severely decreased LVEF measuring 20-25% with global hypokinesis and grade 1 diastolic dysfunction. Patient treated with Lasix IV. No hypoxia. Lasix IV transitioned to oral formulation. Goal directed medical therapy not possible secondary to chronic  hypotension. -Continue furosemide -Continue Jardiance  Chronic hypotension Likely related to low-output heart failure. Patient is managed on midodrine. -Continue midodrine  Depression Suicidal ideation Patient seen and evaluated by psychiatry. Plan to discharge to behavioral health. -Continue Abilify, Klonopin and Zoloft  CKD stage IIIa Creatinine stable.  Ventricular tachycardia Likely related to heart failure and severely reduced LVEF. Patient is s/p [employer]. Jude single chamber ICD which was turned off at previous admission secondary to transition to hospice after VT storm. -Continue Mexitil and amiodarone  COPD -Continue Breo Ellipta and albuterol  HIV -Continue Biktarvy   DVT prophylaxis: Heparin subq Code Status:   Code Status: Limited: Do not attempt resuscitation (DNR) -DNR-LIMITED -Do Not Intubate/DNI  Family Communication: None at bedside Disposition Plan: Discharge to behavioral health once bed is available   Consultants:  Psychiatry Palliative care medicine  Procedures:    Antimicrobials:     Subjective: No concerns this morning.  Objective: BP 94/77 (BP Location: Right Arm)   Pulse 96   Temp 97.9 F (36.6 C)   Resp 18   Ht 5\' 8"  (1.727 m)   Wt 75.3 kg   SpO2 91%   BMI 25.24 kg/m   Examination:  General exam: Appears calm and comfortable Respiratory system: Respiratory effort normal. Central nervous system: Alert and oriented.   Data Reviewed: I have personally reviewed following labs and imaging studies  CBC Lab Results  Component Value Date   WBC 2.6 (L) 02/23/2024   RBC 5.04 02/23/2024   HGB 16.6 02/23/2024   HCT 50.3 02/23/2024   MCV 99.8 02/23/2024   MCH 32.9 02/23/2024   PLT 162 02/23/2024   MCHC 33.0 02/23/2024   RDW 14.8 02/23/2024   LYMPHSABS 1.0  02/23/2024   MONOABS 0.4 02/23/2024   EOSABS 0.0 02/23/2024   BASOSABS 0.0 02/23/2024     Last metabolic panel Lab Results  Component Value Date   NA 139 02/29/2024    K 4.1 02/29/2024   CL 103 02/29/2024   CO2 25 02/29/2024   BUN 28 (H) 02/29/2024   CREATININE 1.74 (H) 02/29/2024   GLUCOSE 76 02/29/2024   GFRNONAA 44 (L) 02/29/2024   GFRAA 79 04/20/2021   CALCIUM 8.7 (L) 02/29/2024   PHOS 3.5 02/23/2024   PROT 5.9 (L) 02/28/2024   ALBUMIN 3.0 (L) 02/28/2024   LABGLOB 1.5 09/06/2021   AGRATIO 2.6 (H) 09/06/2021   BILITOT 1.2 02/28/2024   ALKPHOS 79 02/28/2024   AST 30 02/28/2024   ALT 33 02/28/2024   ANIONGAP 11 02/29/2024    GFR: Estimated Creatinine Clearance: 42.6 mL/min (A) (by C-G formula based on SCr of 1.74 mg/dL (H)).  No results found for this or any previous visit (from the past 240 hours).    Radiology Studies: No results found.    LOS: 13 days    Jacquelin Hawking, MD [facility] 03/02/2024, 9:10 AM   If 7PM-7AM, please contact night-coverage [email or web address]

## 2024-03-02 NOTE — Plan of Care (Signed)
   Problem: Nutrition: Goal: Adequate nutrition will be maintained Outcome: Progressing   Problem: Coping: Goal: Level of anxiety will decrease Outcome: Progressing   Problem: Safety: Goal: Ability to remain free from injury will improve Outcome: Progressing

## 2024-03-03 ENCOUNTER — Ambulatory Visit: Payer: Self-pay | Admitting: Infectious Disease

## 2024-03-03 DIAGNOSIS — I5023 Acute on chronic systolic (congestive) heart failure: Secondary | ICD-10-CM | POA: Diagnosis not present

## 2024-03-03 MED ORDER — ORAL CARE MOUTH RINSE: 15.0000 mL | OROMUCOSAL | Status: DC | PRN

## 2024-03-03 NOTE — Telephone Encounter (Signed)
 This is a CHF pt

## 2024-03-03 NOTE — Progress Notes (Signed)
 PROGRESS NOTE    Ryan Long  ZOX:[id number] DOB: 02-04-1961 DOA: 02/16/2024 PCP: Cristino Martes, NP   Brief Narrative: 63 y.o. male with medical history significant of systolic and diastolic congestive heart failure (Echo 09/2023 EF 20-25% with G1DD)  status post AICD (has been off since 01/28/2024), NSVT, HIV, hypertension, hyperlipidemia, COPD, anxiety, depression, bipolar disorder, substance abuse, BPH, CKD stage IIIa.  Presenting to [facility] emergency department with chest pain and shortness of breath.    Of note, patient was recently admitted by cardiology service 2/5-2/21 for worsening heart failure symptoms and VT storm.  Palliative care was consulted and patient was made DNR/DNI.  Patient had decided to pursue hospice and his ICD was inactivated.  He was discharged to group home with hospice.   Upon evaluation in the emergency department, case was discussed with cardiology and and the hospitalist was called to assess the patient for admission to the hospital.   Cardiology was consulted and patient was placed on a regimen of intravenous Lasix and fluid restriction.  Strict input and output monitoring was performed as well as daily weights.  Throughout the course of the hospitalization patient was also found to be extremely tearful and repeatedly resported suicidal ideation.  Psychiatry was consulted and upon their evaluation they additionally agreed that patient's suicidal ideation with plan was concerning.  Initially, plan was for patient to be discharged to a geriatric psychiatry bed but after consideration they felt the patient was not a candidate.   Assessment and Plan:  Acute on chronic HFrEF Patient with severely decreased LVEF measuring 20-25% with global hypokinesis and grade 1 diastolic dysfunction. Patient treated with Lasix IV. No hypoxia. Lasix IV transitioned to oral formulation. Goal directed medical therapy not possible secondary to chronic  hypotension. -Continue furosemide -Continue Jardiance  Chronic hypotension Likely related to low-output heart failure. Patient is managed on midodrine. -Continue midodrine  Depression Suicidal ideation Patient seen and evaluated by psychiatry. Plan to discharge to behavioral health. -Continue Abilify, Klonopin and Zoloft  CKD stage IIIa Creatinine stable.  Ventricular tachycardia Likely related to heart failure and severely reduced LVEF. Patient is s/p St. Jude single chamber ICD which was turned off at previous admission secondary to transition to hospice after VT storm. -Continue Mexitil and amiodarone  COPD -Continue Breo Ellipta and albuterol  HIV -Continue Biktarvy   DVT prophylaxis: Heparin subq Code Status:   Code Status: Limited: Do not attempt resuscitation (DNR) -DNR-LIMITED -Do Not Intubate/DNI  Family Communication: None at bedside Disposition Plan: Discharge to behavioral health once bed is available   Consultants:  Psychiatry Palliative care medicine  Procedures:    Antimicrobials:     Subjective: Does not like the food much. Dyspnea with exertion.  Objective: BP 94/73 (BP Location: Right Arm)   Pulse 95   Temp 97.6 F (36.4 C)   Resp 17   Ht 5\' 8"  (1.727 m)   Wt 75.3 kg   SpO2 93%   BMI 25.24 kg/m   Examination:  General exam: Appears calm and comfortable Respiratory system: Clear to auscultation. Respiratory effort normal. Cardiovascular system: S1 & S2 heard, RRR. Gastrointestinal system: Abdomen is nondistended, soft and nontender. Normal bowel sounds heard. Central nervous system: Alert and oriented. No focal neurological deficits. Musculoskeletal: No edema. No calf tenderness Psychiatry: Judgement and insight appear normal. Mood & affect appropriate.    Data Reviewed: I have personally reviewed following labs and imaging studies  CBC Lab Results  Component Value Date  WBC 2.6 (L) 02/23/2024   RBC 5.04 02/23/2024   HGB  16.6 02/23/2024   HCT 50.3 02/23/2024   MCV 99.8 02/23/2024   MCH 32.9 02/23/2024   PLT 162 02/23/2024   MCHC 33.0 02/23/2024   RDW 14.8 02/23/2024   LYMPHSABS 1.0 02/23/2024   MONOABS 0.4 02/23/2024   EOSABS 0.0 02/23/2024   BASOSABS 0.0 02/23/2024     Last metabolic panel Lab Results  Component Value Date   NA 139 02/29/2024   K 4.1 02/29/2024   CL 103 02/29/2024   CO2 25 02/29/2024   BUN 28 (H) 02/29/2024   CREATININE 1.74 (H) 02/29/2024   GLUCOSE 76 02/29/2024   GFRNONAA 44 (L) 02/29/2024   GFRAA 79 04/20/2021   CALCIUM 8.7 (L) 02/29/2024   PHOS 3.5 02/23/2024   PROT 5.9 (L) 02/28/2024   ALBUMIN 3.0 (L) 02/28/2024   LABGLOB 1.5 09/06/2021   AGRATIO 2.6 (H) 09/06/2021   BILITOT 1.2 02/28/2024   ALKPHOS 79 02/28/2024   AST 30 02/28/2024   ALT 33 02/28/2024   ANIONGAP 11 02/29/2024    GFR: Estimated Creatinine Clearance: 42.6 mL/min (A) (by C-G formula based on SCr of 1.74 mg/dL (H)).  No results found for this or any previous visit (from the past 240 hours).    Radiology Studies: No results found.    LOS: 14 days    Jacquelin Hawking, MD [facility] 03/03/2024, 9:35 AM   If 7PM-7AM, please contact night-coverage [email or web address]

## 2024-03-03 NOTE — Plan of Care (Signed)
  Problem: Clinical Measurements: Goal: Diagnostic test results will improve Outcome: Progressing Goal: Respiratory complications will improve Outcome: Progressing   Problem: Coping: Goal: Level of anxiety will decrease Outcome: Progressing

## 2024-03-03 NOTE — Progress Notes (Signed)
 Pt comfortable and pleasant. Denies any needs at present. Nino Parsley

## 2024-03-03 NOTE — TOC Progression Note (Signed)
 Transition of Care [facility]) - Progression Note    Patient Details  Name: Ryan Long MRN: [id number] Date of Birth: 1961-05-28  Transition of [facility]) CM/SW Contact  Deronda Christian, Olegario Messier, RN Phone Number: 03/03/2024, 4:05 PM  Clinical Narrative: Per psych-IP Psych;Patient continues on [facility] waitlist per [facility].No other IP Psych bed offers.      Expected Discharge Plan: Psychiatric Hospital Barriers to Discharge: Continued Medical Work up  Expected Discharge Plan and Services       Living arrangements for the past 2 months: Boarding House ([employer])                                       Social Determinants of Health (SDOH) Interventions SDOH Screenings   Food Insecurity: No Food Insecurity (02/17/2024)  Housing: Low Risk  (02/17/2024)  Transportation Needs: No Transportation Needs (02/17/2024)  Utilities: Not At Risk (02/17/2024)  Alcohol Screen: Low Risk  (07/17/2023)  Depression (PHQ2-9): Low Risk  (08/01/2023)  Financial Resource Strain: Low Risk  (07/17/2023)  Physical Activity: Insufficiently Active (07/17/2023)  Social Connections: Moderately Integrated (07/17/2023)  Stress: Stress Concern Present (07/17/2023)  Tobacco Use: High Risk (02/16/2024)  Health Literacy: Adequate Health Literacy (07/18/2023)    Readmission Risk Interventions     No data to display

## 2024-03-03 NOTE — Consult Note (Signed)
 Inpatient Psychiatry Consult  In-Person follow up  [name] Consult Evaluation  Service Date: March 03, 2024 LOS:  LOS: 14 days    Primary Psychiatric Diagnoses   Major Depressive Disorder 2. Adjustment disorder with depressed mood 3. Post Traumatic Stress Disorder 4.  Substance use disorder  Assessment  Willmar Stockinger is a 63 y.o. male admitted medically for 02/16/2024  7:18 PM for chest pain. He carries the psychiatric diagnoses of Depression and substance use disorder and has a past medical history of  chronic HFrEF/end-stage cardiomyopathy status post AICD, NSVT, HIV, hypertension, hyperlipidemia, COPD, BPH, CKD stage IIIa . Psychiatry was consulted as the patient endorsed SI to the primary team.   Of note, the patient was recently discharged from cardiology service where he was admitted due to HF exacerbation and VT storm. While he was recommended to go to SNF, the patient declined the option and decided to pursue hospice and went back to group home.  He is currently on Klonopin PRNs. Marinol was recently started by cardiology during this admission.  Patient currently on suicide precautions.  . His initial presentation of depressed mood and intermittent thoughts of suicidality  is most consistent with Adjustment disorder with depressed mood. However, he does have long standing depression and likely PTSD. He likely was using alcohol and drugs to self-treat these symptoms. This, in addition to poor social support and no meaning psychiatric care made it challenging for him to develop meaningful coping skills.   At this time, will continue tele-sitter given these intrusive thoughts. IT should be noted that his risk of self-harm is very low in a supportive environment (like the hospital). As such, we can likely d/c the tele-sitter tomorrow.  Will start Mirtazapine to help with mood, sleep and appetite. I did not get a history consistent with Bipolar d/o; as such Mirtazapine  should not lead to any manic episode.   03/03/2024:The patient continues to endorse suicidal ideation, stating, "It is the same plan I had before." When probed further, the patient corrected the method from walking in front of a car to walking in front of a truck. The patient reported having 1-2 suicidal thoughts this morning but provided vague details and did not elaborate on specific intent or lethality. This vagueness, along with the lack of specific details, may raise concerns about the authenticity of the patient's statements. Additionally, the patient's avoidance of eye contact and engagement in other activities during discussions about discharge suggest reluctance to engage in treatment planning. The patient has previously expressed openness to partial hospitalization and assistance with placement but is now resistant to both, which may indicate a desire to remain inpatient. This resistance to discharge, coupled with a refusal to discuss potential outpatient services, could be indicative of a possible malingering presentation, though further assessment is needed to clarify this concern.  The patient's behavior is inconsistent with prior visits where he expressed interest in partial hospitalization, raising doubts about the current reports of suicidal ideation and his willingness to cooperate with discharge planning. Despite the potential malingering, it is important to continue prioritizing patient safety and documenting any changes in behavior. The patient's medical comorbidities and poor prognosis have made it difficult to secure inpatient placement, further complicating the discharge process. Given the lack of bed offers over the past two weeks, a change in goals has been implemented, with a focus on medication stabilization and outpatient services. Although malingering is a consideration, the patient's safety must remain a priority, and ongoing monitoring  of suicidal ideation is necessary. A safety  plan has been discussed with the patient, and it was reiterated that while suicidal ideations may persist at discharge, this will be addressed appropriately in the outpatient setting. The treatment team will continue to work toward a safe disposition for the patient, ensuring they are supported with the resources needed post-discharge.  Diagnoses:  Active Hospital problems: Principal Problem:   Acute on chronic HFrEF (heart failure with reduced ejection fraction) (HCC) Active Problems:   HIV (human immunodeficiency virus infection) (HCC)   Depression with suicidal ideation   COPD (chronic obstructive pulmonary disease) (HCC)   Goals of care, counseling/discussion   Ventricular tachycardia (HCC)   Chest pain   Chronic kidney disease, stage 3a (HCC)     Plan   ## Psychiatric Medication Recommendations:  - Continue mirtazapine 15 mg at bedtime for depression - Continue trazodone 50 mg daily at bedtime as needed for sleep initiation - Continue  Zoloft to 100  mg PO  daily  -Continue  Abilify 7.5 mg to augment the effect of Zoloft. -Continue close observations from nursing staff   ## Medical Decision Making Capacity:  - Not formally assessed today.   ## Further Work-up:  -- None   ## Disposition:  -- We recommend inpatient psychiatric hospitalization, bed plaement has been very challenging. Other barriers that compliate disposition include lack of family and friends to safety plan with. Will look into partial hospitalization at this time.  versus pending safety planning for patient to discharge to skilled nursing facility. Patient is under voluntary admission status at this time; please IVC if attempts to leave hospital prior to psychiatric reassessment  ## Behavioral / Environmental:  ---- No special recommendations.    ## Safety and Observation Level:  - Based on my clinical evaluation, I estimate the patient to be at low risk of self harm in the current setting - At this  time, we recommend a intermediate level of observation. This decision is based on my review of the chart including patient's history and current presentation, interview of the patient, mental status examination, and consideration of suicide risk including evaluating suicidal ideation, plan, intent, suicidal or self-harm behaviors, risk factors, and protective factors. This judgment is based on our ability to directly address suicide risk, implement suicide prevention strategies and develop a safety plan while the patient is in the clinical setting. Please contact our team if there is a concern that risk level has changed.  Suicide risk assessment  Patient has following modifiable risk factors for suicide: active suicidal ideation, untreated depression, social isolation, and medication noncompliance, which we are addressing by inpatient psychiatric admission recommendation. Will work on other modifiable risk factors, unable to access collateral as he reports not having family or friends which makes disposition outside the hospital difficult.   Patient has following non-modifiable or demographic risk factors for suicide: male gender and psychiatric hospitalization  Patient has the following protective factors against suicide: no history of suicide attempts and no history of NSSIB   Thank you for this consult request. Recommendations have been communicated to the primary team.  We will continue to follow up at this time.   Maryagnes Amos, FNP-PMHNP-BC  Psychiatric and Social History   Relevant Aspects of Hospital Course:  Admitted on 02/16/2024 for chest pain. He reported SI to the nursing staff, and psych was consulted. Actively followed by Cardiology, Palliative, and LTC team.   Patient Report:  02/29/2024: Patient seen and assessed today. He is  improving in regards to his mood and suicidal thoughts. He reports a reduction of his suicidal ideations and describes them as intermittent " they  come and go, but I can say they are getting better." He reports his mood is also improving. Although his appetite is poor he has several ensure bottles at his bedside. He is sleeping fairly well. He is also observed to be ambulating independetly in the room and does not appear to be SOB when returning to resting position. He denies any suicidal ideations at this time. He remains motivated to find housing and get mental health treatment.   03/03/2024: Patient denies any side effects to medications at this time. He does not seem to be open to engaging and participating in his treatment planning today.He is sleeping and eating ok at this time. He was engaging with Environmental services before I entered the room. Some of his presentation at times is consistent with depression, on other days he is motivated to seeking treatment and placement.   Psych ROS:  Depression: low mood, fleeting thought of death; no true intent, poor appetite Anxiety:  chronic anxiety Mania (lifetime and current): denied Psychosis: (lifetime and current): denied Psychiatric History:  Information collected from patient and chart review  Had a previous psych hospitalization few years ago. Currently not on any psych meds except for Klonopin. Never engaged in therapy  Single; never married. No kids.  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 Stopped working in 2017 when he started getting disability. No contact with any family.  Access to weapons: denied  Substance History Tobacco: 31.5 pack year history Alcohol use: Abstinent for the past 2 years Drug use: Used cocaine and marijuana; Last used cocaine 2 years ago; last used marijuana a year ago.    Exam Findings   Psychiatric Specialty Exam: Observed in the restroom, ambulating. He is alert and oriented, calm and cooperative.   Presentation  General Appearance: Appropriate for Environment  Eye Contact: Good Speech:Clear and Coherent; Normal Rate  Speech  Volume:Normal  Handedness:Right   Mood and Affect  Mood:Depressed  Affect:Appropriate; Congruent   Thought Process  Thought Processes:Coherent; Linear  Descriptions of Associations:Intact  Orientation:Full (Time, Place and Person)  Thought Content:Logical  Hallucinations: Denies AVh   Ideas of Reference:None  Suicidal Thoughts: Denies  Homicidal Thoughts: Denies     Sensorium  Memory:Immediate Fair; Recent Fair; Remote Fair  Judgment:Poor  Insight:Fair   Executive Functions  Concentration:Fair  Attention Span:Good  Recall:Fair  Fund of Knowledge:Good  Language:Good   Psychomotor Activity  Psychomotor Activity:No data recorded     Assets  Assets:Communication Skills; Desire for Improvement; Resilience   Sleep  Sleep:No data recorded      Physical Exam: Vital signs:  Temp:  [97.6 F (36.4 C)-98.7 F (37.1 C)] 97.6 F (36.4 C) (03/17 0453) Pulse Rate:  [95] 95 (03/17 0453) Resp:  [15-17] 17 (03/17 0453) BP: (94)/(73-80) 94/73 (03/17 0453) SpO2:  [93 %-97 %] 96 % (03/17 0959) FiO2 (%):  [21 %-28 %] 28 % (03/17 0959) Weight:  [74.7 kg] 74.7 kg (03/17 0959) Physical Exam HENT:     Head: Normocephalic.  Pulmonary:     Effort: Pulmonary effort is normal. No respiratory distress.  Skin:    General: Skin is warm and dry.  Neurological:     General: No focal deficit present.     Mental Status: He is alert and oriented to person, place, and time.  Psychiatric:  Mood and Affect: Mood normal.        Behavior: Behavior normal.     Blood pressure 94/73, pulse 95, temperature 97.6 F (36.4 C), resp. rate 17, height 5\' 8"  (1.727 m), weight 74.7 kg, SpO2 96%. Body mass index is 25.04 kg/m.   Other History   These have been pulled in through the EMR, reviewed, and updated if appropriate.   The patient's family history includes Alcohol abuse in his brother and father; CAD in his father; Drug abuse in his brother, brother, and  father; Early death in his father; Glaucoma in his mother; Heart attack (age of onset: 40) in his father; Heart disease in his father; Hypertension in his father; Mental illness in his father and mother; Vision loss in his mother.  Medical History: Past Medical History:  Diagnosis Date   Active smoker    AICD (automatic cardioverter/defibrillator) present    Alcohol abuse    Allergy July 2023   Anxiety    AR (allergic rhinitis)    Bipolar 1 disorder (HCC)    Cervical lymphadenitis 04/20/2021   CHF (congestive heart failure) (HCC)    Chronic bronchitis (HCC)    Chronic systolic heart failure (HCC)    Controlled substance agreement signed 10/22/2018   COPD (chronic obstructive pulmonary disease) (HCC)    COPD (chronic obstructive pulmonary disease) (HCC) 10/04/2015   Cough 12/31/2018   Crack cocaine use    Depression    Genital herpes    HIV (human immunodeficiency virus infection) (HCC) dx'd 08/1993   HLD (hyperlipidemia)    Hypertension    NICM (nonischemic cardiomyopathy) (HCC)    Echocardiogram 06/28/11: EF 30-35%, mild MR, mild LAE;  No CAD by coronary CT angiogram 3/12 at [facility]   NSVT (nonsustained ventricular tachycardia) (HCC)    PTSD (post-traumatic stress disorder)     Surgical History: Past Surgical History:  Procedure Laterality Date   CARDIAC DEFIBRILLATOR PLACEMENT  01/08/2018   ICD IMPLANT N/A 01/08/2018   Procedure: ICD IMPLANT;  Surgeon: Regan Lemming, MD;  Location: [facility] INV[facility]IVE [facility];  Service: Cardiovascular;  Laterality: N/A;   RIGHT/LEFT HEART CATH AND CORONARY ANGIOGRAPHY N/A 01/03/2024   Procedure: RIGHT/LEFT HEART CATH AND CORONARY ANGIOGRAPHY;  Surgeon: Dolores Patty, MD;  Location: [facility] INVASIVE CV [facility];  Service: Cardiovascular;  Laterality: N/A;    Medications:   Current Facility-Administered Medications:    acetaminophen (TYLENOL) tablet 650 mg, 650 mg, Oral, Q6H PRN, 650 mg at 02/23/24 0027 **OR** acetaminophen  (TYLENOL) suppository 650 mg, 650 mg, Rectal, Q6H PRN, Shalhoub, Deno Lunger, MD   albuterol (PROVENTIL) (2.5 MG/3ML) 0.083% nebulizer solution 2.5 mg, 2.5 mg, Nebulization, Q6H PRN, [name] Giovanni, MD, 2.5 mg at 03/02/24 2008   amiodarone (PACERONE) tablet 200 mg, 200 mg, Oral, Daily, Marolyn Haller, MD, 200 mg at 03/03/24 1021   ARIPiprazole (ABILIFY) tablet 7.5 mg, 7.5 mg, Oral, Daily, Starkes-Perry, Kerline Trahan S, FNP, 7.5 mg at 03/03/24 1021   aspirin EC tablet 81 mg, 81 mg, Oral, Daily, [name] Giovanni, MD, 81 mg at 03/03/24 1020   bictegravir-emtricitabine-tenofovir AF (BIKTARVY) 50-200-25 MG per tablet 1 tablet, 1 tablet, Oral, Daily, [name] Giovanni, MD, 1 tablet at 03/03/24 1022   butalbital-acetaminophen-caffeine (FIORICET) 50-325-40 MG per tablet 1 tablet, 1 tablet, Oral, Q6H PRN, Shalhoub, Deno Lunger, MD, 1 tablet at 03/01/24 0856   clonazepam (KLONOPIN) disintegrating tablet 0.125 mg, 0.125 mg, Oral, TID, [name] Giovanni, MD, 0.125 mg at 03/03/24 1020   dronabinol (MARINOL) capsule 5 mg, 5  mg, Oral, BID AC, [name] Giovanni, MD, 5 mg at 03/03/24 1218   empagliflozin (JARDIANCE) tablet 10 mg, 10 mg, Oral, QAC breakfast, [name] Giovanni, MD, 10 mg at 03/03/24 1610   feeding supplement (ENSURE ENLIVE / ENSURE PLUS) liquid 237 mL, 237 mL, Oral, BID BM, [place], Eric J, DO, 237 mL at 03/03/24 1022   finasteride (PROSCAR) tablet 5 mg, 5 mg, Oral, Daily, Rathore, Vasundhra, MD, 5 mg at 03/03/24 1020   fluticasone furoate-vilanterol (BREO ELLIPTA) 200-25 MCG/ACT 1 puff, 1 puff, Inhalation, Daily, [name] Giovanni, MD, 1 puff at 03/03/24 0959   furosemide (LASIX) tablet 40 mg, 40 mg, Oral, Junious Silk, Rosita Fire, MD, 40 mg at 03/02/24 1726   heparin injection 5,000 Units, 5,000 Units, Subcutaneous, Q8H, [place], Alvira Philips, DO, 5,000 Units at 03/03/24 0541   mexiletine (MEXITIL) capsule 250 mg, 250 mg, Oral, Q12H, [name] Giovanni, MD, 250 mg at 03/03/24 1021   midodrine (PROAMATINE)  tablet 10 mg, 10 mg, Oral, TID WC, [name] Giovanni, MD, 10 mg at 03/03/24 1218   mirtazapine (REMERON) tablet 15 mg, 15 mg, Oral, QHS, Mariel Craft, MD, 15 mg at 03/02/24 2144   morphine (PF) 2 MG/ML injection 1 mg, 1 mg, Intravenous, Q3H PRN, [place], Alvira Philips, DO, 1 mg at 02/25/24 0034   ondansetron [facility]) injection 4 mg, 4 mg, Intravenous, Q6H PRN, [place], Alvira Philips, DO, 4 mg at 03/02/24 1720   Oral care mouth rinse, 15 mL, Mouth Rinse, PRN, Narda Bonds, MD   oxyCODONE (Oxy IR/ROXICODONE) immediate release tablet 5 mg, 5 mg, Oral, Q4H PRN, [place], Alvira Philips, DO, 5 mg at 03/01/24 2112   polyethylene glycol (MIRALAX / GLYCOLAX) packet 17 g, 17 g, Oral, Daily, [name] Giovanni, MD, 17 g at 03/01/24 0842   sertraline (ZOLOFT) tablet 100 mg, 100 mg, Oral, Daily, Starkes-Perry, Hillary Schwegler S, FNP, 100 mg at 03/03/24 1021   tamsulosin (FLOMAX) capsule 0.4 mg, 0.4 mg, Oral, BID, [name] Giovanni, MD, 0.4 mg at 03/03/24 1020   traZODone (DESYREL) tablet 50 mg, 50 mg, Oral, QHS PRN, Mariel Craft, MD, 50 mg at 02/27/24 2116   valACYclovir (VALTREX) tablet 500 mg, 500 mg, Oral, Daily, [name] Giovanni, MD, 500 mg at 03/03/24 1021  Allergies: No Known Allergies

## 2024-03-04 DIAGNOSIS — I5023 Acute on chronic systolic (congestive) heart failure: Secondary | ICD-10-CM | POA: Diagnosis not present

## 2024-03-04 LAB — POTASSIUM: Potassium: 3.5 mmol/L (ref 3.5–5.1)

## 2024-03-04 LAB — MAGNESIUM: Magnesium: 2.2 mg/dL (ref 1.7–2.4)

## 2024-03-04 NOTE — Plan of Care (Signed)
  Problem: Health Behavior/Discharge Planning: Goal: Ability to manage health-related needs will improve Outcome: Progressing   Problem: Clinical Measurements: Goal: Ability to maintain clinical measurements within normal limits will improve Outcome: Progressing Goal: Will remain free from infection Outcome: Progressing   Problem: Nutrition: Goal: Adequate nutrition will be maintained Outcome: Progressing   Problem: Coping: Goal: Level of anxiety will decrease Outcome: Progressing   Problem: Elimination: Goal: Will not experience complications related to bowel motility Outcome: Progressing   Problem: Pain Managment: Goal: General experience of comfort will improve and/or be controlled Outcome: Progressing   Problem: Safety: Goal: Ability to remain free from injury will improve Outcome: Progressing   Problem: Skin Integrity: Goal: Risk for impaired skin integrity will decrease Outcome: Progressing   Problem: Education: Goal: Knowledge of General Education information will improve Description: Including pain rating scale, medication(s)/side effects and non-pharmacologic comfort measures Outcome: Progressing   Problem: Health Behavior/Discharge Planning: Goal: Ability to manage health-related needs will improve Outcome: Progressing   Problem: Clinical Measurements: Goal: Ability to maintain clinical measurements within normal limits will improve Outcome: Progressing Goal: Will remain free from infection Outcome: Progressing Goal: Diagnostic test results will improve Outcome: Progressing Goal: Respiratory complications will improve Outcome: Progressing Goal: Cardiovascular complication will be avoided Outcome: Progressing   Problem: Activity: Goal: Risk for activity intolerance will decrease Outcome: Progressing   Problem: Nutrition: Goal: Adequate nutrition will be maintained Outcome: Progressing   Problem: Coping: Goal: Level of anxiety will  decrease Outcome: Progressing   Problem: Elimination: Goal: Will not experience complications related to bowel motility Outcome: Progressing Goal: Will not experience complications related to urinary retention Outcome: Progressing   Problem: Pain Managment: Goal: General experience of comfort will improve and/or be controlled Outcome: Progressing   Problem: Safety: Goal: Ability to remain free from injury will improve Outcome: Progressing   Problem: Skin Integrity: Goal: Risk for impaired skin integrity will decrease Outcome: Progressing

## 2024-03-04 NOTE — TOC Progression Note (Addendum)
 Transition of Care [facility]) - Progression Note    Patient Details  Name: Ryan Long MRN: [id number] Date of Birth: 26-Nov-1961  [facility] of [facility]) CM/SW Contact  Jaydenn Boccio, Olegario Messier, RN Phone Number: 03/04/2024, 3:59 PM  Clinical Narrative: Noted per Psych-recc partial hospitalization;outpatient setting. Patient removed from [facility] waitlist-spoke to [facility] for removal.[facility] from [facility]-friends of [facility]-patient willing to return.    Expected Discharge Plan:  (Drug recou[facility] house) Barriers to Discharge: Continued Medical Work up  Expected Discharge Plan and Services       Living arrangements for the past 2 months: Boarding House ([employer])                                       Social Determinants of Health (SDOH) Interventions SDOH Screenings   Food Insecurity: No Food Insecurity (02/17/2024)  Housing: Low Risk  (02/17/2024)  Transportation Needs: No Transportation Needs (02/17/2024)  Utilities: Not At Risk (02/17/2024)  Alcohol Screen: Low Risk  (07/17/2023)  Depression (PHQ2-9): Low Risk  (08/01/2023)  Financial Resource Strain: Low Risk  (07/17/2023)  Physical Activity: Insufficiently Active (07/17/2023)  Social Connections: Moderately Integrated (07/17/2023)  Stress: Stress Concern Present (07/17/2023)  Tobacco Use: High Risk (02/16/2024)  Health Literacy: Adequate Health Literacy (07/18/2023)    Readmission Risk Interventions     No data to display

## 2024-03-04 NOTE — Progress Notes (Signed)
 PROGRESS NOTE    Ryan Long  ZOX:[id number] DOB: June 28, 1961 DOA: 02/16/2024 PCP: Cristino Martes, NP   Brief Narrative: 63 y.o. male with medical history significant of systolic and diastolic congestive heart failure (Echo 09/2023 EF 20-25% with G1DD)  status post AICD (has been off since 01/28/2024), NSVT, HIV, hypertension, hyperlipidemia, COPD, anxiety, depression, bipolar disorder, substance abuse, BPH, CKD stage IIIa.  Presenting to [facility] emergency department with chest pain and shortness of breath.    Of note, patient was recently admitted by cardiology service 2/5-2/21 for worsening heart failure symptoms and VT storm.  Palliative care was consulted and patient was made DNR/DNI.  Patient had decided to pursue hospice and his ICD was inactivated.  He was discharged to group home with hospice.   Upon evaluation in the emergency department, case was discussed with cardiology and and the hospitalist was called to assess the patient for admission to the hospital.   Cardiology was consulted and patient was placed on a regimen of intravenous Lasix and fluid restriction.  Strict input and output monitoring was performed as well as daily weights.  Throughout the course of the hospitalization patient was also found to be extremely tearful and repeatedly resported suicidal ideation.  Psychiatry was consulted and upon their evaluation they additionally agreed that patient's suicidal ideation with plan was concerning.  Initially, plan was for patient to be discharged to a geriatric psychiatry bed but after consideration they felt the patient was not a candidate.   Assessment and Plan:  Acute on chronic HFrEF Patient with severely decreased LVEF measuring 20-25% with global hypokinesis and grade 1 diastolic dysfunction. Patient treated with Lasix IV. No hypoxia. Lasix IV transitioned to oral formulation. Goal directed medical therapy not possible secondary to chronic  hypotension. -Continue furosemide -Continue Jardiance  Chronic hypotension Likely related to low-output heart failure. Patient is managed on midodrine. -Continue midodrine  Depression Suicidal ideation Patient seen and evaluated by psychiatry. Plan to discharge to behavioral health. -Continue Abilify, Klonopin and Zoloft  CKD stage IIIa Creatinine stable.  Ventricular tachycardia Likely related to heart failure and severely reduced LVEF. Patient is s/p [employer] single chamber ICD which was turned off at previous admission secondary to transition to hospice after VT storm. Patient with multiple episodes of nonsustained ventricular tachycardia this admission. Patient is considering if he would like his ICD turned back on. -Continue Mexitil and amiodarone -Re-consult palliative care/cardiology depending on if patient's decision on reactivating his ICD  COPD -Continue Breo Ellipta and albuterol  HIV -Continue Biktarvy   DVT prophylaxis: Heparin subq Code Status:   Code Status: Limited: Do not attempt resuscitation (DNR) -DNR-LIMITED -Do Not Intubate/DNI  Family Communication: None at bedside Disposition Plan: Discharge to behavioral health once bed is available   Consultants:  Psychiatry Palliative care medicine  Procedures:    Antimicrobials:     Subjective: Not liking the food. Questions about goals of care and ICD. States he is not ready for hospice at this time, but does want to focus on quality of life.he is unsure if having his ICD turned off is good or bad for him.  Objective: BP (!) 86/59   Pulse 90   Temp 97.6 F (36.4 C) (Oral)   Resp 20   Ht 5\' 8"  (1.727 m)   Wt 73.1 kg   SpO2 100%   BMI 24.50 kg/m   Examination:  General exam: Appears calm and comfortable Respiratory system: Clear to auscultation. Respiratory effort normal. Cardiovascular system:  S1 & S2 heard, RRR. Gastrointestinal system: Abdomen is nondistended, soft and nontender. Normal  bowel sounds heard. Central nervous system: Alert and oriented. No focal neurological deficits. Psychiatry: Judgement and insight appear normal.   Data Reviewed: I have personally reviewed following labs and imaging studies  CBC Lab Results  Component Value Date   WBC 2.6 (L) 02/23/2024   RBC 5.04 02/23/2024   HGB 16.6 02/23/2024   HCT 50.3 02/23/2024   MCV 99.8 02/23/2024   MCH 32.9 02/23/2024   PLT 162 02/23/2024   MCHC 33.0 02/23/2024   RDW 14.8 02/23/2024   LYMPHSABS 1.0 02/23/2024   MONOABS 0.4 02/23/2024   EOSABS 0.0 02/23/2024   BASOSABS 0.0 02/23/2024     Last metabolic panel Lab Results  Component Value Date   NA 139 02/29/2024   K 3.5 03/04/2024   CL 103 02/29/2024   CO2 25 02/29/2024   BUN 28 (H) 02/29/2024   CREATININE 1.74 (H) 02/29/2024   GLUCOSE 76 02/29/2024   GFRNONAA 44 (L) 02/29/2024   GFRAA 79 04/20/2021   CALCIUM 8.7 (L) 02/29/2024   PHOS 3.5 02/23/2024   PROT 5.9 (L) 02/28/2024   ALBUMIN 3.0 (L) 02/28/2024   LABGLOB 1.5 09/06/2021   AGRATIO 2.6 (H) 09/06/2021   BILITOT 1.2 02/28/2024   ALKPHOS 79 02/28/2024   AST 30 02/28/2024   ALT 33 02/28/2024   ANIONGAP 11 02/29/2024    GFR: Estimated Creatinine Clearance: 42.6 mL/min (A) (by C-G formula based on SCr of 1.74 mg/dL (H)).  No results found for this or any previous visit (from the past 240 hours).    Radiology Studies: No results found.    LOS: 15 days    Jacquelin Hawking, MD [facility] 03/04/2024, 12:06 PM   If 7PM-7AM, please contact night-coverage [email or web address]

## 2024-03-05 DIAGNOSIS — I5023 Acute on chronic systolic (congestive) heart failure: Secondary | ICD-10-CM | POA: Diagnosis not present

## 2024-03-05 LAB — CBC
HCT: 52.2 % — ABNORMAL HIGH (ref 39.0–52.0)
Hemoglobin: 17.9 g/dL — ABNORMAL HIGH (ref 13.0–17.0)
MCH: 33 pg (ref 26.0–34.0)
MCHC: 34.3 g/dL (ref 30.0–36.0)
MCV: 96.3 fL (ref 80.0–100.0)
Platelets: 167 10*3/uL (ref 150–400)
RBC: 5.42 MIL/uL (ref 4.22–5.81)
RDW: 14.6 % (ref 11.5–15.5)
WBC: 2.7 10*3/uL — ABNORMAL LOW (ref 4.0–10.5)
nRBC: 0 % (ref 0.0–0.2)

## 2024-03-05 LAB — BASIC METABOLIC PANEL
Anion gap: 13 (ref 5–15)
BUN: 28 mg/dL — ABNORMAL HIGH (ref 8–23)
CO2: 22 mmol/L (ref 22–32)
Calcium: 8.6 mg/dL — ABNORMAL LOW (ref 8.9–10.3)
Chloride: 102 mmol/L (ref 98–111)
Creatinine, Ser: 1.7 mg/dL — ABNORMAL HIGH (ref 0.61–1.24)
GFR, Estimated: 45 mL/min — ABNORMAL LOW (ref >60)
Glucose, Bld: 92 mg/dL (ref 70–99)
Potassium: 4.3 mmol/L (ref 3.5–5.1)
Sodium: 137 mmol/L (ref 135–145)

## 2024-03-05 NOTE — Plan of Care (Signed)
  Problem: Health Behavior/Discharge Planning: Goal: Ability to manage health-related needs will improve Outcome: Progressing   Problem: Clinical Measurements: Goal: Ability to maintain clinical measurements within normal limits will improve Outcome: Progressing Goal: Will remain free from infection Outcome: Progressing   Problem: Nutrition: Goal: Adequate nutrition will be maintained Outcome: Progressing   Problem: Coping: Goal: Level of anxiety will decrease Outcome: Progressing   Problem: Elimination: Goal: Will not experience complications related to bowel motility Outcome: Progressing   Problem: Pain Managment: Goal: General experience of comfort will improve and/or be controlled Outcome: Progressing   Problem: Safety: Goal: Ability to remain free from injury will improve Outcome: Progressing   Problem: Skin Integrity: Goal: Risk for impaired skin integrity will decrease Outcome: Progressing   Problem: Education: Goal: Knowledge of General Education information will improve Description: Including pain rating scale, medication(s)/side effects and non-pharmacologic comfort measures Outcome: Progressing   Problem: Health Behavior/Discharge Planning: Goal: Ability to manage health-related needs will improve Outcome: Progressing   Problem: Clinical Measurements: Goal: Ability to maintain clinical measurements within normal limits will improve Outcome: Progressing Goal: Will remain free from infection Outcome: Progressing Goal: Diagnostic test results will improve Outcome: Progressing Goal: Respiratory complications will improve Outcome: Progressing Goal: Cardiovascular complication will be avoided Outcome: Progressing   Problem: Activity: Goal: Risk for activity intolerance will decrease Outcome: Progressing   Problem: Nutrition: Goal: Adequate nutrition will be maintained Outcome: Progressing   Problem: Coping: Goal: Level of anxiety will  decrease Outcome: Progressing   Problem: Elimination: Goal: Will not experience complications related to bowel motility Outcome: Progressing Goal: Will not experience complications related to urinary retention Outcome: Progressing   Problem: Pain Managment: Goal: General experience of comfort will improve and/or be controlled Outcome: Progressing   Problem: Safety: Goal: Ability to remain free from injury will improve Outcome: Progressing   Problem: Skin Integrity: Goal: Risk for impaired skin integrity will decrease Outcome: Progressing

## 2024-03-05 NOTE — Progress Notes (Signed)
 PROGRESS NOTE    Ryan Long  QMV:[id number] DOB: [birth year] DOA: 02/16/2024 PCP: Cristino Martes, NP   Brief Narrative: 63 y.o. male with medical history significant of systolic and diastolic congestive heart failure (Echo 09/2023 EF 20-25% with G1DD)  status post AICD (has been off since 01/28/2024), NSVT, HIV, hypertension, hyperlipidemia, COPD, anxiety, depression, bipolar disorder, substance abuse, BPH, CKD stage IIIa.  Presenting to [facility] emergency department with chest pain and shortness of breath.     Of note, patient was recently admitted by cardiology service 2/5-2/21 for worsening heart failure symptoms and VT storm.  Palliative care was consulted and patient was made DNR/DNI.  Patient had decided to pursue hospice and his ICD was inactivated.  He was discharged to group home with hospice.   Upon evaluation in the emergency department, case was discussed with cardiology and and the hospitalist was called to assess the patient for admission to the hospital.   Cardiology was consulted and patient was placed on a regimen of intravenous Lasix and fluid restriction.  Strict input and output monitoring was performed as well as daily weights.   Throughout the course of the hospitalization patient was also found to be extremely tearful and repeatedly resported suicidal ideation.  Psychiatry was consulted and upon their evaluation they additionally agreed that patient's suicidal ideation with plan was concerning.  Initially, plan was for patient to be discharged to a geriatric psychiatry bed but after consideration they felt the patient was not a candidate.  Assessment & Plan:   Principal Problem:   Acute on chronic HFrEF (heart failure with reduced ejection fraction) (HCC) Active Problems:   HIV (human immunodeficiency virus infection) (HCC)   Depression with suicidal ideation   COPD (chronic obstructive pulmonary disease) (HCC)   Goals of care, counseling/discussion    Ventricular tachycardia (HCC)   Chest pain   Chronic kidney disease, stage 3a (HCC)    Acute on chronic HFrEF Patient with severely decreased LVEF measuring 20-25% with global hypokinesis and grade 1 diastolic dysfunction. Patient treated with Lasix IV. No hypoxia. Lasix IV transitioned to oral formulation. Goal directed medical therapy not possible secondary to chronic hypotension. -Continue furosemide -Continue Jardiance   Chronic hypotension Likely related to low-output heart failure. Patient is managed on midodrine. -Continue midodrine   Depression Suicidal ideation Patient seen and evaluated by psychiatry. Plan to discharge to behavioral health. -Continue Abilify, Klonopin and Zoloft -psych following   CKD stage IIIa Creatinine stable.   Ventricular tachycardia Likely related to heart failure and severely reduced LVEF. Patient is s/p [employer] Jude single chamber ICD which was turned off at previous admission secondary to transition to hospice after VT storm. Patient with multiple episodes of nonsustained ventricular tachycardia this admission. Patient is considering if he would like his ICD turned back on. -Continue Mexitil and amiodarone -Re-consult palliative care/cardiology depending on if patient's decision on reactivating his ICD   COPD -Continue Breo Ellipta and albuterol   HIV -Continue Biktarvy  Estimated body mass index is 24.47 kg/m as calculated from the following:   Height as of this encounter: 5\' 8"  (1.727 m).   Weight as of this encounter: 73 kg.  DVT prophylaxis: Heparin subq Code Status:   Code Status: Limited: Do not attempt resuscitation (DNR) -DNR-LIMITED -Do Not Intubate/DNI  Family Communication: None at bedside Disposition Plan: Discharge to behavioral health once bed is available     Consultants:  Psychiatry Palliative care medicine   Procedures:  none    Antimicrobials:  none    Subjective:  Asking for Breo  inhaler  Objective: Vitals:   03/05/24 0829 03/05/24 0837 03/05/24 1200 03/05/24 1258  BP: 95/68  119/83 90/75  Pulse: 88  (!) 117 (!) 109  Resp:    18  Temp:    (!) 97.5 F (36.4 C)  TempSrc:    Oral  SpO2:  96%  96%  Weight:      Height:        Intake/Output Summary (Last 24 hours) at 03/05/2024 1451 Last data filed at 03/05/2024 1023 Gross per 24 hour  Intake 717 ml  Output 1400 ml  Net -683 ml   Filed Weights   03/03/24 0959 03/04/24 0500 03/05/24 0500  Weight: 74.7 kg 73.1 kg 73 kg    Examination:  General exam: Appears in no acute distress Respiratory system: Clear to auscultation. Respiratory effort normal. Cardiovascular system: S1 & S2 heard, RRR. No JVD, murmurs, rubs, gallops or clicks. No pedal edema. Gastrointestinal system: Abdomen is nondistended, soft and nontender. No organomegaly or masses felt. Normal bowel sounds heard. Central nervous system: Alert and oriented. No focal neurological deficits. Extremities: Symmetric 5 x 5 power.   Data Reviewed: I have personally reviewed following labs and imaging studies  CBC: Recent Labs  Lab 03/05/24 0523  WBC 2.7*  HGB 17.9*  HCT 52.2*  MCV 96.3  PLT 167   Basic Metabolic Panel: Recent Labs  Lab 02/28/24 0941 02/29/24 0725 03/04/24 1014 03/05/24 0523  NA 138 139  --  137  K 4.1 4.1 3.5 4.3  CL 101 103  --  102  CO2 25 25  --  22  GLUCOSE 97 76  --  92  BUN 29* 28*  --  28*  CREATININE 1.78* 1.74*  --  1.70*  CALCIUM 8.7* 8.7*  --  8.6*  MG 2.4  --  2.2  --    GFR: Estimated Creatinine Clearance: 43.6 mL/min (A) (by C-G formula based on SCr of 1.7 mg/dL (H)). Liver Function Tests: Recent Labs  Lab 02/28/24 0941  AST 30  ALT 33  ALKPHOS 79  BILITOT 1.2  PROT 5.9*  ALBUMIN 3.0*   No results for input(s): "LIPASE", "AMYLASE" in the last 168 hours. No results for input(s): "AMMONIA" in the last 168 hours. Coagulation Profile: No results for input(s): "INR", "PROTIME" in the last 168  hours. Cardiac Enzymes: No results for input(s): "CKTOTAL", "CKMB", "CKMBINDEX", "TROPONINI" in the last 168 hours. BNP (last 3 results) No results for input(s): "PROBNP" in the last 8760 hours. HbA1C: No results for input(s): "HGBA1C" in the last 72 hours. CBG: No results for input(s): "GLUCAP" in the last 168 hours. Lipid Profile: No results for input(s): "CHOL", "HDL", "LDLCALC", "TRIG", "CHOLHDL", "LDLDIRECT" in the last 72 hours. Thyroid Function Tests: No results for input(s): "TSH", "T4TOTAL", "FREET4", "T3FREE", "THYROIDAB" in the last 72 hours. Anemia Panel: No results for input(s): "VITAMINB12", "FOLATE", "FERRITIN", "TIBC", "IRON", "RETICCTPCT" in the last 72 hours. Sepsis Labs: No results for input(s): "PROCALCITON", "LATICACIDVEN" in the last 168 hours.  No results found for this or any previous visit (from the past 240 hours).    Radiology Studies: No results found.  Scheduled Meds:  amiodarone  200 mg Oral Daily   ARIPiprazole  7.5 mg Oral Daily   aspirin EC  81 mg Oral Daily   bictegravir-emtricitabine-tenofovir AF  1 tablet Oral Daily   clonazepam  0.125 mg Oral TID   dronabinol  5 mg Oral BID AC  empagliflozin  10 mg Oral QAC breakfast   feeding supplement  237 mL Oral BID BM   finasteride  5 mg Oral Daily   fluticasone furoate-vilanterol  1 puff Inhalation Daily   furosemide  40 mg Oral QODAY   heparin injection (subcutaneous)  5,000 Units Subcutaneous Q8H   mexiletine  250 mg Oral Q12H   midodrine  10 mg Oral TID WC   mirtazapine  15 mg Oral QHS   polyethylene glycol  17 g Oral Daily   sertraline  100 mg Oral Daily   tamsulosin  0.4 mg Oral BID   valACYclovir  500 mg Oral Daily   Continuous Infusions:   LOS: 16 days   Time spent: 39 min  Alwyn Ren, MD  03/05/2024, 2:51 PM

## 2024-03-05 NOTE — Consult Note (Signed)
 Inpatient Psychiatry Consult  In-Person follow up  [name] Psych[facility] Consult Evaluation  Service Date: March 05, 2024 LOS:  LOS: 16 days    Primary Psychiatric Diagnoses   Major Depressive Disorder 2. Adjustment disorder with depressed mood 3. Post Traumatic Stress Disorder 4.  Substance use disorder  Assessment  Ryan Long is a 63 y.o. male admitted medically for 02/16/2024  7:18 PM for chest pain. He carries the psychiatric diagnoses of Depression and substance use disorder and has a past medical history of  chronic HFrEF/end-stage cardiomyopathy status post AICD, NSVT, HIV, hypertension, hyperlipidemia, COPD, BPH, CKD stage IIIa . Psychiatry was consulted as the patient endorsed SI to the primary team.   Of note, the patient was recently discharged from cardiology service where he was admitted due to HF exacerbation and VT storm. While he was recommended to go to SNF, the patient declined the option and decided to pursue hospice and went back to group home.  He is currently on Klonopin PRNs. Marinol was recently started by cardiology during this admission.  Patient currently on suicide precautions.  . His initial presentation of depressed mood and intermittent thoughts of suicidality  is most consistent with Adjustment disorder with depressed mood. However, he does have long standing depression and likely PTSD. He likely was using alcohol and drugs to self-treat these symptoms. This, in addition to poor social support and no meaning psychiatric care made it challenging for him to develop meaningful coping skills.   At this time, will continue tele-sitter given these intrusive thoughts. IT should be noted that his risk of self-harm is very low in a supportive environment (like the hospital). As such, we can likely d/c the tele-sitter tomorrow.  Will start Mirtazapine to help with mood, sleep and appetite. I did not get a history consistent with Bipolar d/o; as such Mirtazapine  should not lead to any manic episode.   03/05/2024:The patient was seen and assessed today. Although initially grumpy, he was much more interactive with this provider compared to previous visits. He engaged in meaningful conversation, smiling and laughing throughout the interaction. He appeared brighter upon approach and seemed less guarded than in prior visits. The chaplain visited, and the patient welcomed her into the room. He is eating and sleeping well. The patient reports that his suicidal ideations have improved, stating they are "nowhere near as bad as they used to be." While he does not seem open to returning to [facility], he does understand that he no longer requires inpatient-level services. The patient denies any side effects at this time and also denies any current suicidal ideations. He was able to hold a very good conversation about the new administration, demonstrating clear, linear thinking, engagement, and insight.    Diagnoses:  Active Hospital problems: Principal Problem:   Acute on chronic HFrEF (heart failure with reduced ejection fraction) (HCC) Active Problems:   HIV (human immunodeficiency virus infection) (HCC)   Depression with suicidal ideation   COPD (chronic obstructive pulmonary disease) (HCC)   Goals of care, counseling/discussion   Ventricular tachycardia (HCC)   Chest pain   Chronic kidney disease, stage 3a (HCC)     Plan   ## Psychiatric Medication Recommendations:  - Continue mirtazapine 15 mg at bedtime for depression - Continue trazodone 50 mg daily at bedtime as needed for sleep initiation - Continue  Zoloft to 100  mg PO  daily  -Continue  Abilify 7.5 mg to augment the effect of Zoloft. -Continue close observations from  nursing staff   ## Medical Decision Making Capacity:  - Not formally assessed today.   ## Further Work-up:  -- None   ## Disposition:  -- We  recommend starting outpatient services to include partial hospitalization at  this time.  versus pending safety planning for patient to discharge to skilled nursing facility. Patient is under voluntary admission status at this time; please IVC if attempts to leave hospital prior to psychiatric reassessment.  ## Behavioral / Environmental:  ---- No special recommendations.    ## Safety and Observation Level:  - Based on my clinical evaluation, I estimate the patient to be at low risk of self harm in the current setting.  - At this time, we recommend a low level of observation. This decision is based on my review of the chart including patient's history and current presentation, interview of the patient, mental status examination, and consideration of suicide risk including evaluating suicidal ideation, plan, intent, suicidal or self-harm behaviors, risk factors, and protective factors. This judgment is based on our ability to directly address suicide risk, implement suicide prevention strategies and develop a safety plan while the patient is in the clinical setting. Please contact our team if there is a concern that risk level has changed.  Suicide risk assessment  Patient has following modifiable risk factors for suicide: untreated depression and social isolation, which we are addressing by outpatient services. Will work on other modifiable risk factors, unable to access collateral as he reports not having family or friends which makes disposition outside the hospital difficult.   Patient has following non-modifiable or demographic risk factors for suicide: male gender and psychiatric hospitalization  Patient has the following protective factors against suicide: no history of suicide attempts and no history of NSSIB   Thank you for this consult request. Recommendations have been communicated to the primary team.  We will continue to follow up at this time.   Maryagnes Amos, FNP-PMHNP-BC  Psychiatric and Social History   Relevant Aspects of Hospital Course:   Admitted on 02/16/2024 for chest pain. He reported SI to the nursing staff, and psych was consulted. Actively followed by Cardiology, Palliative, and LTC team.   Patient Report:  03/05/2024: The patient was seen and assessed today. He is showing improvement in his mood and suicidal thoughts. He reports a decrease in suicidal ideations, describing them as intermittent--"they come and go, but I can say they are getting better." He denies any current suicidal ideations and remains motivated to find housing and pursue mental health treatment. He also denies experiencing any side effects from his medications at this time.He was observed interacting with chaplain after I entered the room. At times, his presentation aligns with symptoms of depression, but on other days, he demonstrates motivation to seek treatment and housing placement.  Psych ROS:  Depression: low mood, fleeting thought of death; no true intent, poor appetite Anxiety:  chronic anxiety Mania (lifetime and current): denied Psychosis: (lifetime and current): denied Psychiatric History:  Information collected from patient and chart review  Had a previous psych hospitalization few years ago. Currently not on any psych meds except for Klonopin. Never engaged in therapy  Single; never married. No kids.  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 Stopped working in 2017 when he started getting disability. No contact with any family.  Access to weapons: denied  Substance History Tobacco: 31.5 pack year history Alcohol use: Abstinent for the past 2 years Drug use: Used cocaine and  marijuana; Last used cocaine 2 years ago; last used marijuana a year ago.    Exam Findings   Psychiatric Specialty Exam: Observed in the restroom, ambulating. He is alert and oriented, calm and cooperative.   Presentation  General Appearance: Appropriate for Environment  Eye Contact: Good Speech:Clear and Coherent; Normal Rate  Speech  Volume:Normal  Handedness:Right   Mood and Affect  Mood:Depressed  Affect:Appropriate; Congruent   Thought Process  Thought Processes:Coherent; Linear  Descriptions of Associations:Intact  Orientation:Full (Time, Place and Person)  Thought Content:Logical  Hallucinations: Denies AVh   Ideas of Reference:None  Suicidal Thoughts: Denies  Homicidal Thoughts: Denies     Sensorium  Memory:Immediate Fair; Recent Fair; Remote Fair  Judgment:Fair Insight:Fair   Executive Functions  Concentration:Fair  Attention Span:Good  Recall:Fair  Fund of Knowledge:Good  Language:Good   Psychomotor Activity  Psychomotor Activity:No data recorded     Assets  Assets:Communication Skills; Desire for Improvement; Resilience   Sleep  Sleep:6 hours      Physical Exam: Vital signs:  Temp:  [97.5 F (36.4 C)-97.8 F (36.6 C)] 97.5 F (36.4 C) (03/19 1258) Pulse Rate:  [88-117] 109 (03/19 1258) Resp:  [18-20] 18 (03/19 1258) BP: (85-119)/(68-83) 90/75 (03/19 1258) SpO2:  [94 %-100 %] 96 % (03/19 1258) FiO2 (%):  [21 %] 21 % (03/19 0837) Weight:  [73 kg] 73 kg (03/19 0500) Physical Exam HENT:     Head: Normocephalic.  Pulmonary:     Effort: Pulmonary effort is normal. No respiratory distress.  Skin:    General: Skin is warm and dry.  Neurological:     General: No focal deficit present.     Mental Status: He is alert and oriented to person, place, and time.  Psychiatric:        Mood and Affect: Mood normal.        Behavior: Behavior normal.     Blood pressure 90/75, pulse (!) 109, temperature (!) 97.5 F (36.4 C), temperature source Oral, resp. rate 18, height 5\' 8"  (1.727 m), weight 73 kg, SpO2 96%. Body mass index is 24.47 kg/m.   Other History   These have been pulled in through the EMR, reviewed, and updated if appropriate.   The patient's family history includes Alcohol abuse in his brother and father; CAD in his father; Drug abuse in his  brother, brother, and father; Early death in his father; Glaucoma in his mother; Heart attack (age of onset: 4) in his father; Heart disease in his father; Hypertension in his father; Mental illness in his father and mother; Vision loss in his mother.  Medical History: Past Medical History:  Diagnosis Date   Active smoker    AICD (automatic cardioverter/defibrillator) present    Alcohol abuse    Allergy July 2023   Anxiety    AR (allergic rhinitis)    Bipolar 1 disorder (HCC)    Cervical lymphadenitis 04/20/2021   CHF (congestive heart failure) (HCC)    Chronic bronchitis (HCC)    Chronic systolic heart failure (HCC)    Controlled substance agreement signed 10/22/2018   COPD (chronic obstructive pulmonary disease) (HCC)    COPD (chronic obstructive pulmonary disease) (HCC) 10/04/2015   Cough 12/31/2018   Crack cocaine use    Depression    Genital herpes    HIV (human immunodeficiency virus infection) (HCC) dx'd 08/1993   HLD (hyperlipidemia)    Hypertension    NICM (nonischemic cardiomyopathy) (HCC)    Echocardiogram 06/28/11: EF 30-35%, mild MR, mild LAE;  No CAD by coronary CT angiogram 3/12 at [facility]   NSVT (nonsustained ventricular tachycardia) (HCC)    PTSD (post-traumatic stress disorder)     Surgical History: Past Surgical History:  Procedure Laterality Date   CARDIAC DEFIBRILLATOR PLACEMENT  01/08/2018   ICD IMPLANT N/A 01/08/2018   Procedure: ICD IMPLANT;  Surgeon: Regan Lemming, MD;  Location: [facility] INVASIVE CV LAB;  Service: Cardiovascular;  Laterality: N/A;   RIGHT/LEFT HEART CATH AND CORONARY ANGIOGRAPHY N/A 01/03/2024   Procedure: RIGHT/LEFT HEART CATH AND CORONARY ANGIOGRAPHY;  Surgeon: Dolores Patty, MD;  Location: [facility] INVASIVE CV LAB;  Service: Cardiovascular;  Laterality: N/A;    Medications:   Current Facility-Administered Medications:    acetaminophen (TYLENOL) tablet 650 mg, 650 mg, Oral, Q6H PRN, 650 mg at 02/23/24 0027  **OR** acetaminophen (TYLENOL) suppository 650 mg, 650 mg, Rectal, Q6H PRN, Shalhoub, Deno Lunger, MD   albuterol (PROVENTIL) (2.5 MG/3ML) 0.083% nebulizer solution 2.5 mg, 2.5 mg, Nebulization, Q6H PRN, [name] Giovanni, MD, 2.5 mg at 03/05/24 1201   amiodarone (PACERONE) tablet 200 mg, 200 mg, Oral, Daily, Marolyn Haller, MD, 200 mg at 03/05/24 0831   ARIPiprazole (ABILIFY) tablet 7.5 mg, 7.5 mg, Oral, Daily, Starkes-Perry, Daryon Remmert S, FNP, 7.5 mg at 03/05/24 0831   aspirin EC tablet 81 mg, 81 mg, Oral, Daily, [name] Giovanni, MD, 81 mg at 03/05/24 0831   bictegravir-emtricitabine-tenofovir AF (BIKTARVY) 50-200-25 MG per tablet 1 tablet, 1 tablet, Oral, Daily, [name] Giovanni, MD, 1 tablet at 03/05/24 6045   butalbital-acetaminophen-caffeine (FIORICET) 50-325-40 MG per tablet 1 tablet, 1 tablet, Oral, Q6H PRN, Shalhoub, Deno Lunger, MD, 1 tablet at 03/01/24 0856   clonazepam (KLONOPIN) disintegrating tablet 0.125 mg, 0.125 mg, Oral, TID, [name] Giovanni, MD, 0.125 mg at 03/05/24 0831   dronabinol (MARINOL) capsule 5 mg, 5 mg, Oral, BID AC, [name] Giovanni, MD, 5 mg at 03/05/24 1201   empagliflozin (JARDIANCE) tablet 10 mg, 10 mg, Oral, QAC breakfast, [name] Giovanni, MD, 10 mg at 03/05/24 0830   feeding supplement (ENSURE ENLIVE / ENSURE PLUS) liquid 237 mL, 237 mL, Oral, BID BM, [place], Eric J, DO, 237 mL at 03/05/24 0839   finasteride (PROSCAR) tablet 5 mg, 5 mg, Oral, Daily, [name] Giovanni, MD, 5 mg at 03/05/24 0831   fluticasone furoate-vilanterol (BREO ELLIPTA) 200-25 MCG/ACT 1 puff, 1 puff, Inhalation, Daily, [name] Giovanni, MD, 1 puff at 03/05/24 0836   furosemide (LASIX) tablet 40 mg, 40 mg, Oral, Junious Silk, Rosita Fire, MD, 40 mg at 03/04/24 0851   heparin injection 5,000 Units, 5,000 Units, Subcutaneous, Q8H, [place], Alvira Philips, DO, 5,000 Units at 03/05/24 0544   mexiletine (MEXITIL) capsule 250 mg, 250 mg, Oral, Q12H, [name] Giovanni, MD, 250 mg at 03/05/24 0832    midodrine (PROAMATINE) tablet 10 mg, 10 mg, Oral, TID WC, [name] Giovanni, MD, 10 mg at 03/05/24 1201   mirtazapine (REMERON) tablet 15 mg, 15 mg, Oral, QHS, Mariel Craft, MD, 15 mg at 03/04/24 2132   morphine (PF) 2 MG/ML injection 1 mg, 1 mg, Intravenous, Q3H PRN, [place], Alvira Philips, DO, 1 mg at 02/25/24 0034   ondansetron (ZOFRAN) injection 4 mg, 4 mg, Intravenous, Q6H PRN, [place], Alvira Philips, DO, 4 mg at 03/05/24 1234   Oral care mouth rinse, 15 mL, Mouth Rinse, PRN, Narda Bonds, MD   oxyCODONE (Oxy IR/ROXICODONE) immediate release tablet 5 mg, 5 mg, Oral, Q4H PRN, [place], Alvira Philips, DO, 5 mg at 03/04/24 2258   polyethylene glycol (MIRALAX / GLYCOLAX) packet 17  g, 17 g, Oral, Daily, [name] Giovanni, MD, 17 g at 03/01/24 0842   sertraline (ZOLOFT) tablet 100 mg, 100 mg, Oral, Daily, Starkes-Perry, Taneesha Edgin S, FNP, 100 mg at 03/05/24 0831   tamsulosin (FLOMAX) capsule 0.4 mg, 0.4 mg, Oral, BID, [name] Giovanni, MD, 0.4 mg at 03/05/24 0831   traZODone (DESYREL) tablet 50 mg, 50 mg, Oral, QHS PRN, Mariel Craft, MD, 50 mg at 03/04/24 2131   valACYclovir (VALTREX) tablet 500 mg, 500 mg, Oral, Daily, [name] Giovanni, MD, 500 mg at 03/05/24 0830  Allergies: No Known Allergies

## 2024-03-06 DIAGNOSIS — I5023 Acute on chronic systolic (congestive) heart failure: Secondary | ICD-10-CM | POA: Diagnosis not present

## 2024-03-06 MED ORDER — MIDODRINE HCL 5 MG PO TABS
15.0000 mg | ORAL_TABLET | Freq: Three times a day (TID) | ORAL | Status: DC
  Administered 2024-03-06 – 2024-03-07 (×3): 15 mg via ORAL
  Filled 2024-03-06 (×3): qty 3

## 2024-03-06 MED ORDER — SODIUM CHLORIDE 0.9 % IV SOLN: INTRAVENOUS | Status: AC

## 2024-03-06 NOTE — Progress Notes (Signed)
 PROGRESS NOTE    Ryan Long  ZOX:[id number] DOB: Nov 11, 1961 DOA: 02/16/2024 PCP: Cristino Martes, NP   Brief Narrative: 63 y.o. male with medical history significant of systolic and diastolic congestive heart failure (Echo 09/2023 EF 20-25% with G1DD)  status post AICD (has been off since 01/28/2024), NSVT, HIV, hypertension, hyperlipidemia, COPD, anxiety, depression, bipolar disorder, substance abuse, BPH, CKD stage IIIa.  Presenting to [facility] emergency department with chest pain and shortness of breath.     Of note, patient was recently admitted by cardiology service 2/5-2/21 for worsening heart failure symptoms and VT storm.  Palliative care was consulted and patient was made DNR/DNI.  Patient had decided to pursue hospice and his ICD was inactivated.  He was discharged to group home with hospice.   Upon evaluation in the emergency department, case was discussed with cardiology and and the hospitalist was called to assess the patient for admission to the hospital.   Cardiology was consulted and patient was placed on a regimen of intravenous Lasix and fluid restriction.  Strict input and output monitoring was performed as well as daily weights.   Throughout the course of the hospitalization patient was also found to be extremely tearful and repeatedly resported suicidal ideation.  Psychiatry was consulted and upon their evaluation they additionally agreed that patient's suicidal ideation with plan was concerning.  Initially, plan was for patient to be discharged to a geriatric psychiatry bed but after consideration they felt the patient was not a candidate.  Assessment & Plan:   Principal Problem:   Acute on chronic HFrEF (heart failure with reduced ejection fraction) (HCC) Active Problems:   HIV (human immunodeficiency virus infection) (HCC)   Depression with suicidal ideation   COPD (chronic obstructive pulmonary disease) (HCC)   Goals of care, counseling/discussion    Ventricular tachycardia (HCC)   Chest pain   Chronic kidney disease, stage 3a (HCC)    Acute on chronic HFrEF Patient with severely decreased LVEF measuring 20-25% with global hypokinesis and grade 1 diastolic dysfunction. Patient treated with Lasix IV. No hypoxia. Lasix IV transitioned to oral formulation. Goal directed medical therapy not possible secondary to chronic hypotension. -Hold Lasix with hypotension slow IV fluids overnight -Continue Jardiance   Chronic hypotension Likely related to low-output heart failure. Patient is managed on midodrine. Midodrine increased to 15 mg 3 times daily   Depression Suicidal ideation Patient seen and evaluated by psychiatry. Plan to discharge to behavioral health. -Continue Abilify, Klonopin and Zoloft -psych following   CKD stage IIIa Creatinine gradually increasing with hypotension on diuresis holding Lasix increasing midodrine and rechecking labs in AM.   Ventricular tachycardia Likely related to heart failure and severely reduced LVEF. Patient is s/p [employer] Jude single chamber ICD which was turned off at previous admission secondary to transition to hospice after VT storm. Patient with multiple episodes of nonsustained ventricular tachycardia this admission. Patient is considering if he would like his ICD turned back on. -Continue Mexitil and amiodarone -Re-consult palliative care/cardiology depending on if patient's decision on reactivating his ICD   COPD -Continue Breo Ellipta and albuterol   HIV -Continue Biktarvy  Estimated body mass index is 24.74 kg/m as calculated from the following:   Height as of this encounter: 5\' 8"  (1.727 m).   Weight as of this encounter: 73.8 kg.  DVT prophylaxis: Heparin subq Code Status:   Code Status: Limited: Do not attempt resuscitation (DNR) -DNR-LIMITED -Do Not Intubate/DNI  Family Communication: None at bedside Disposition Plan: Discharge  to bills house in am Consultants:   Psychiatry Palliative care medicine   Procedures:  none    Antimicrobials:  none    Subjective:  Asking for Breo inhaler  Objective: Vitals:   03/06/24 0430 03/06/24 1146 03/06/24 1249 03/06/24 1304  BP:  (!) 89/70 (!) 81/62 (!) 88/56  Pulse:   (!) 104   Resp:   16   Temp:   (!) 97.4 F (36.3 C)   TempSrc:      SpO2:   98%   Weight: 73.8 kg     Height:        Intake/Output Summary (Last 24 hours) at 03/06/2024 1343 Last data filed at 03/06/2024 1300 Gross per 24 hour  Intake 717 ml  Output --  Net 717 ml   Filed Weights   03/04/24 0500 03/05/24 0500 03/06/24 0430  Weight: 73.1 kg 73 kg 73.8 kg    Examination:  General exam: Appears in no acute distress Respiratory system: Clear to auscultation. Respiratory effort normal. Cardiovascular system: S1 & S2 heard, RRR. No JVD, murmurs, rubs, gallops or clicks. No pedal edema. Gastrointestinal system: Abdomen is nondistended, soft and nontender. No organomegaly or masses felt. Normal bowel sounds heard. Central nervous system: Alert and oriented. No focal neurological deficits. Extremities: Symmetric 5 x 5 power.   Data Reviewed: I have personally reviewed following labs and imaging studies  CBC: Recent Labs  Lab 03/05/24 0523  WBC 2.7*  HGB 17.9*  HCT 52.2*  MCV 96.3  PLT 167   Basic Metabolic Panel: Recent Labs  Lab 02/29/24 0725 03/04/24 1014 03/05/24 0523  NA 139  --  137  K 4.1 3.5 4.3  CL 103  --  102  CO2 25  --  22  GLUCOSE 76  --  92  BUN 28*  --  28*  CREATININE 1.74*  --  1.70*  CALCIUM 8.7*  --  8.6*  MG  --  2.2  --    GFR: Estimated Creatinine Clearance: 43.6 mL/min (A) (by C-G formula based on SCr of 1.7 mg/dL (H)). Liver Function Tests: No results for input(s): "AST", "ALT", "ALKPHOS", "BILITOT", "PROT", "ALBUMIN" in the last 168 hours.  No results for input(s): "LIPASE", "AMYLASE" in the last 168 hours. No results for input(s): "AMMONIA" in the last 168 hours. Coagulation  Profile: No results for input(s): "INR", "PROTIME" in the last 168 hours. Cardiac Enzymes: No results for input(s): "CKTOTAL", "CKMB", "CKMBINDEX", "TROPONINI" in the last 168 hours. BNP (last 3 results) No results for input(s): "PROBNP" in the last 8760 hours. HbA1C: No results for input(s): "HGBA1C" in the last 72 hours. CBG: No results for input(s): "GLUCAP" in the last 168 hours. Lipid Profile: No results for input(s): "CHOL", "HDL", "LDLCALC", "TRIG", "CHOLHDL", "LDLDIRECT" in the last 72 hours. Thyroid Function Tests: No results for input(s): "TSH", "T4TOTAL", "FREET4", "T3FREE", "THYROIDAB" in the last 72 hours. Anemia Panel: No results for input(s): "VITAMINB12", "FOLATE", "FERRITIN", "TIBC", "IRON", "RETICCTPCT" in the last 72 hours. Sepsis Labs: No results for input(s): "PROCALCITON", "LATICACIDVEN" in the last 168 hours.  No results found for this or any previous visit (from the past 240 hours).    Radiology Studies: No results found.  Scheduled Meds:  amiodarone  200 mg Oral Daily   ARIPiprazole  7.5 mg Oral Daily   aspirin EC  81 mg Oral Daily   bictegravir-emtricitabine-tenofovir AF  1 tablet Oral Daily   clonazepam  0.125 mg Oral TID   dronabinol  5 mg Oral  BID AC   empagliflozin  10 mg Oral QAC breakfast   feeding supplement  237 mL Oral BID BM   finasteride  5 mg Oral Daily   fluticasone furoate-vilanterol  1 puff Inhalation Daily   furosemide  40 mg Oral QODAY   heparin injection (subcutaneous)  5,000 Units Subcutaneous Q8H   mexiletine  250 mg Oral Q12H   midodrine  10 mg Oral TID WC   mirtazapine  15 mg Oral QHS   polyethylene glycol  17 g Oral Daily   sertraline  100 mg Oral Daily   tamsulosin  0.4 mg Oral BID   valACYclovir  500 mg Oral Daily   Continuous Infusions:   LOS: 17 days   Time spent: 39 min  Alwyn Ren, MD  03/06/2024, 1:43 PM

## 2024-03-06 NOTE — TOC Progression Note (Addendum)
 Transition of Care [facility]) - Progression Note    Patient Details  Name: Ryan Long MRN: [id number] Date of Birth: Jul 22, 1961  Transition of Care [facility]) CM/SW Contact  Jerrika Ledlow, Olegario Messier, RN Phone Number: 03/06/2024, 12:33 PM  Clinical Narrative:Patient was visited by [facility] case manager Jasmine B-informed her of d/c plans-patient in agreement-to return back to Recovery house-[facility] owner [name]-able to accept-safe transprot rep [employer] pick up tomorrow @ 2p nsg main tel# given;aware patient as rollator pick up @ main entrance [address] [place] going to [facility] [address] [place] [place] [id number] to provide ride waiver & face sheet for safe transport.  -2:32p-ride waiver signed;face sheet both in shadow chart for tomorrow safe transport to receive.       Expected Discharge Plan: Home/Self Care Barriers to Discharge: No Barriers Identified  Expected Discharge Plan and Services       Living arrangements for the past 2 months: Boarding House ([employer])                                       Social Determinants of Health (SDOH) Interventions SDOH Screenings   Food Insecurity: No Food Insecurity (02/17/2024)  Housing: Low Risk  (02/17/2024)  Transportation Needs: No Transportation Needs (02/17/2024)  Utilities: Not At Risk (02/17/2024)  Alcohol Screen: Low Risk  (07/17/2023)  Depression (PHQ2-9): Low Risk  (08/01/2023)  Financial Resource Strain: Low Risk  (07/17/2023)  Physical Activity: Insufficiently Active (07/17/2023)  Social Connections: Moderately Integrated (07/17/2023)  Stress: Stress Concern Present (07/17/2023)  Tobacco Use: High Risk (02/16/2024)  Health Literacy: Adequate Health Literacy (07/18/2023)    Readmission Risk Interventions     No data to display

## 2024-03-06 NOTE — Progress Notes (Signed)
   03/06/24 1249  Assess: MEWS Score  Temp (!) 97.4 F (36.3 C)  BP (!) 81/62  MAP (mmHg) 69  Pulse Rate (!) 104  Resp 16  SpO2 98 %  O2 Device Room Air  Assess: MEWS Score  MEWS Temp 0  MEWS Systolic 1  MEWS Pulse 1  MEWS RR 0  MEWS LOC 0  MEWS Score 2  MEWS Score Color Yellow  Assess: if the MEWS score is Yellow or Red  Were vital signs accurate and taken at a resting state? Yes  Does the patient meet 2 or more of the SIRS criteria? No  Does the patient have a confirmed or suspected source of infection? No  MEWS guidelines implemented  Yes, yellow  Treat  MEWS Interventions Considered administering scheduled or prn medications/treatments as ordered  Take Vital Signs  Increase Vital Sign Frequency  Yellow: Q2hr x1, continue Q4hrs until patient remains green for 12hrs  Escalate  MEWS: Escalate Yellow: Discuss with charge nurse and consider notifying provider and/or RRT  Provider Notification  Provider Name/Title Dr. Jerolyn Center  Date Provider Notified 03/06/24  Time Provider Notified 1255  Method of Notification Page  Notification Reason Other (Comment) (low BP, yellwo mews)  Provider response See new orders  Date of Provider Response 03/06/24  Time of Provider Response 1351  Assess: SIRS CRITERIA  SIRS Temperature  0  SIRS Respirations  0  SIRS Pulse 1  SIRS WBC 0  SIRS Score Sum  1

## 2024-03-07 ENCOUNTER — Other Ambulatory Visit (HOSPITAL_COMMUNITY): Payer: Self-pay

## 2024-03-07 DIAGNOSIS — I5023 Acute on chronic systolic (congestive) heart failure: Secondary | ICD-10-CM | POA: Diagnosis not present

## 2024-03-07 LAB — BASIC METABOLIC PANEL
Anion gap: 10 (ref 5–15)
BUN: 28 mg/dL — ABNORMAL HIGH (ref 8–23)
CO2: 20 mmol/L — ABNORMAL LOW (ref 22–32)
Calcium: 8.2 mg/dL — ABNORMAL LOW (ref 8.9–10.3)
Chloride: 108 mmol/L (ref 98–111)
Creatinine, Ser: 1.6 mg/dL — ABNORMAL HIGH (ref 0.61–1.24)
GFR, Estimated: 48 mL/min — ABNORMAL LOW (ref >60)
Glucose, Bld: 85 mg/dL (ref 70–99)
Potassium: 3.7 mmol/L (ref 3.5–5.1)
Sodium: 138 mmol/L (ref 135–145)

## 2024-03-07 MED ORDER — ACETAMINOPHEN 325 MG PO TABS: 650.0000 mg | ORAL_TABLET | Freq: Four times a day (QID) | ORAL | Status: DC | PRN

## 2024-03-07 MED ORDER — ONDANSETRON HCL 4 MG PO TABS
4.0000 mg | ORAL_TABLET | Freq: Three times a day (TID) | ORAL | 0 refills | Status: DC | PRN
  Filled 2024-03-07: qty 20, 7d supply, fill #0

## 2024-03-07 MED ORDER — TRAZODONE HCL 50 MG PO TABS
50.0000 mg | ORAL_TABLET | Freq: Every evening | ORAL | 2 refills | Status: DC | PRN
  Filled 2024-03-07: qty 30, 30d supply, fill #0

## 2024-03-07 MED ORDER — MIRTAZAPINE 15 MG PO TABS
15.0000 mg | ORAL_TABLET | Freq: Every day | ORAL | 2 refills | Status: DC
  Filled 2024-03-07: qty 90, 90d supply, fill #0

## 2024-03-07 MED ORDER — MIDODRINE HCL 5 MG PO TABS
15.0000 mg | ORAL_TABLET | Freq: Three times a day (TID) | ORAL | 2 refills | Status: DC
  Filled 2024-03-07: qty 160, 18d supply, fill #0

## 2024-03-07 MED ORDER — SERTRALINE HCL 100 MG PO TABS
100.0000 mg | ORAL_TABLET | Freq: Every day | ORAL | 2 refills | Status: DC
  Filled 2024-03-07: qty 90, 90d supply, fill #0

## 2024-03-07 MED ORDER — ARIPIPRAZOLE 15 MG PO TABS
7.5000 mg | ORAL_TABLET | Freq: Every day | ORAL | 2 refills | Status: DC
  Filled 2024-03-07: qty 13, 26d supply, fill #0

## 2024-03-07 NOTE — TOC Transition Note (Addendum)
 Transition of Care [facility]) - Discharge Note   Patient Details  Name: Ryan Long MRN: [id number] Date of Birth: 1961/12/01  [facility] of [facility]) CM/SW Contact:  Lanier Clam, RN Phone Number: 03/07/2024, 10:31 AM   Clinical Narrative:  all forms in shadow chart for safe transport(signed)-scheduled 2p pick up.Rollator in rm goes with patient.Morrie Sheldon I will give driver ur [employer]#[phone].thnx No further CM needs.  -confirmed w/safe transport rep Alona Bene p/u 2p,contact call#nurse Ashley,& drop off location. No further CM needs.   Final next level of care: Other (comment) (Recovery home) Barriers to Discharge: No Barriers Identified   Patient Goals and CMS Choice Patient states their goals for this hospitalization and ongoing recovery are:: [facility] [employer] recovery house [employer].gov Compare Post Acute Care list provided to:: Patient Choice offered to / list presented to : Patient [facility] ownership interest in [facility].provided to:: Patient    Discharge Placement                       Discharge Plan and Services Additional resources added to the After Visit Summary for                                       Social Drivers of Health (SDOH) Interventions SDOH Screenings   Food Insecurity: No Food Insecurity (02/17/2024)  Housing: Low Risk  (02/17/2024)  Transportation Needs: No Transportation Needs (02/17/2024)  Utilities: Not At Risk (02/17/2024)  Alcohol Screen: Low Risk  (07/17/2023)  Depression (PHQ2-9): Low Risk  (08/01/2023)  Financial Resource Strain: Low Risk  (07/17/2023)  Physical Activity: Insufficiently Active (07/17/2023)  Social Connections: Moderately Integrated (07/17/2023)  Stress: Stress Concern Present (07/17/2023)  Tobacco Use: High Risk (02/16/2024)  Health Literacy: Adequate Health Literacy (07/18/2023)     Readmission Risk Interventions     No data to display

## 2024-03-07 NOTE — Care Management Important Message (Signed)
 Important Message  Patient Details IM Letter given to the Patient. Name: Ryan Long MRN: [id number] Date of Birth: Nov 10, 1961   Important Message Given:  Yes - Medicare IM     Caren Macadam 03/07/2024, 11:01 AM

## 2024-03-07 NOTE — Discharge Instructions (Signed)
 Please continue to take medications as directed. If your symptoms return, worsen, or persist please call your 911, report to local ER, or contact crisis hotline. Please do not drink alcohol or use any illegal substances while taking prescription medications.    Discharge recommendations:   Medications: Patient is to take medications as prescribed. The patient or patient's guardian is to contact a medical professional and/or outpatient provider to address any new side effects that develop. The patient or the patient's guardian should update outpatient providers of any new medications and/or medication changes.    Outpatient Follow up: Please review list of outpatient resources for psychiatry and counseling. Please follow up with your primary care provider for all medical related needs.    Therapy: We recommend that patient participate in individual therapy to address mental health concerns.   Atypical antipsychotics: If you are prescribed an atypical antipsychotic, it is recommended that your height, weight, BMI, blood pressure, fasting lipid panel, and fasting blood sugar be monitored by your outpatient providers.  Safety:   The following safety precautions should be taken:   No sharp objects. This includes scissors, razors, scrapers, and putty knives.   Chemicals should be removed and locked up.   Medications should be removed and locked up.   Weapons should be removed and locked up. This includes firearms, knives and instruments that can be used to cause injury.   The patient should abstain from use of illicit substances/drugs and abuse of any medications.  If symptoms worsen or do not continue to improve or if the patient becomes actively suicidal or homicidal then it is recommended that the patient return to the closest hospital emergency department, the [facility], or call 911 for further evaluation and treatment. National Suicide Prevention Lifeline  1-800-SUICIDE or 774-574-6035.  About 988 988 offers 24/7 access to trained crisis counselors who can help people experiencing mental health-related distress. People can call or text 988 or chat 988lifeline.org for themselves or if they are worried about a loved one who may need crisis support.    Center, [facility] Follow up. Why: A referral for psychiatric medication management and therapy has been made and is currently in review. Please call: [phone] with any questions or to check the status of this referral.  Contact information: [address] [place] [address] [phone]  Additional option(s) for medication management/therapy:  [facility] (offers medication management and therapy) Why: MEdication Management If interested, please walk in between 8am-10am Monday-Friday to be scheduled for an assessment and to initiate services. Contact information: [address] [place] [address] [phone]  Crisis Resources 988-National Suicide Prevention and Crisis Hotline National Suicide Prevention Lifeline (340)791-4565 [facility] [place] Hopelines Teen Line (630)043-8910 National Alliance on Mental Illness (NAMI) 800-950-NAMI Crisis Text Line Text (902)153-0895  [facility] Mobile Crisis Management - 24 hour crisis hotline: (863) 850-0995 The service operates year round, seven days per week, twenty four hours per day. The program is staffed with masters prepared staff that are licensed, licensed eligible therapists, or bachelor's level Qualified Professionals. Mobile Crisis Management serves anyone within the community experiencing a crisis related to mental health, substance abuse, or developmental disability concerns. The staff in this program has the capability to make appropriate clinical decisions in order to determine an appropriate course of action and stabilize a crisis situation as quickly as  possible.  [facility]) [address] [place] [address] [phone] ext [phone]  This unit is staffed 24-hours-a-day by nurses, licensed clinicians, and certified peer support specialist. Services are provided to all persons regardless of age, sex, race, nationality, sexual orientation or ability to pay. Persons under the age of 18 may receive an assessment and routine preventative care.

## 2024-03-07 NOTE — Consult Note (Signed)
 Inpatient Psychiatry Consult  In-Person follow up  [name] Consult Evaluation  Service Date: March 07, 2024 LOS:  LOS: 18 days    Primary Psychiatric Diagnoses   Major Depressive Disorder 2. Adjustment disorder with depressed mood 3. Post Traumatic Stress Disorder 4.  Substance use disorder  Assessment  Ryan Long is a 63 y.o. male admitted medically for 02/16/2024  7:18 PM for chest pain. He carries the psychiatric diagnoses of Depression and substance use disorder and has a past medical history of  chronic HFrEF/end-stage cardiomyopathy status post AICD, NSVT, HIV, hypertension, hyperlipidemia, COPD, BPH, CKD stage IIIa . Psychiatry was consulted as the patient endorsed SI to the primary team.   Of note, the patient was recently discharged from cardiology service where he was admitted due to HF exacerbation and VT storm. While he was recommended to go to SNF, the patient declined the option and decided to pursue hospice and went back to group home.  He is currently on Klonopin PRNs. Marinol was recently started by cardiology during this admission.  Patient currently on suicide precautions.  . His initial presentation of depressed mood and intermittent thoughts of suicidality  is most consistent with Adjustment disorder with depressed mood. However, he does have long standing depression and likely PTSD. He likely was using alcohol and drugs to self-treat these symptoms. This, in addition to poor social support and no meaning psychiatric care made it challenging for him to develop meaningful coping skills.   At this time, will continue tele-sitter given these intrusive thoughts. IT should be noted that his risk of self-harm is very low in a supportive environment (like the hospital). As such, we can likely d/c the tele-sitter tomorrow.  Will start Mirtazapine to help with mood, sleep and appetite. I did not get a history consistent with Bipolar d/o; as such Mirtazapine  should not lead to any manic episode.   03/05/2024:The patient was seen and assessed today. Although initially grumpy, he was much more interactive with this provider compared to previous visits. He engaged in meaningful conversation, smiling and laughing throughout the interaction. He appeared brighter upon approach and seemed less guarded than in prior visits. The chaplain visited, and the patient welcomed her into the room. He is eating and sleeping well. The patient reports that his suicidal ideations have improved, stating they are "nowhere near as bad as they used to be." While he does not seem open to returning to [facility], he does understand that he no longer requires inpatient-level services. The patient denies any side effects at this time and also denies any current suicidal ideations. He was able to hold a very good conversation about the new administration, demonstrating clear, linear thinking, engagement, and insight.  03/07/2024: The patient was seen and assessed this morning. He was observed ambulating around his room and folding his clothes. He reports that he is likely being discharged today and is preparing to gather his belongings. When asked about his mood, he stated, "I guess I'm doing okay. They tell me I'm going home today." Upon further discussion, he shared, "I'm better than where I was when I came in." He plans to continue his medication regimen at discharge and denies any side effects or adverse reactions related to his most recent medication adjustment. He also denies suicidal ideation, homicidal ideation, or hallucinations at this time. The patient is alert, oriented, calm, and cooperative. As of today he has consistently denied suicidal ideations for 5 days.   The patient  has a history of homelessness, malingering, and polysubstance abuse. Despite multiple attempts to safely discharge him after being medically cleared, he initially endorsed suicidal ideations and identified  barriers that would keep him in the hospital. These barriers were addressed, and a safe discharge plan was developed on 03/05/2024, at which point work toward discharge began. The patient is now contracting for safety, and there are no imminent safety concerns at the time of discharge. All primary barriers have been addressed.   Diagnoses:  Active Hospital problems: Principal Problem:   Acute on chronic HFrEF (heart failure with reduced ejection fraction) (HCC) Active Problems:   HIV (human immunodeficiency virus infection) (HCC)   Depression with suicidal ideation   COPD (chronic obstructive pulmonary disease) (HCC)   Goals of care, counseling/discussion   Ventricular tachycardia (HCC)   Chest pain   Chronic kidney disease, stage 3a (HCC)     Plan   ## Psychiatric Medication Recommendations:  - Continue mirtazapine 15 mg at bedtime for depression - Continue trazodone 50 mg daily at bedtime as needed for sleep initiation - Continue  Zoloft to 100  mg PO  daily  -Continue  Abilify 7.5 mg to augment the effect of Zoloft.   ## Medical Decision Making Capacity:  - Not formally assessed today.   ## Further Work-up:  -- None   ## Disposition:  -- No barriers to discharge. At this time he has been here for the purpose of housing. His housing has been secured and he is able to discharge at this time.   ## Behavioral / Environmental:  ---- No special recommendations.    ## Safety and Observation Level:  - Based on my clinical evaluation, I estimate the patient to be at low risk of self harm in the current setting. He has been without a Recruitment consultant the entire 19 day LOS, and has had no attempts to harm self or endanger self during this time.   - At this time, we recommend a low level of observation. This decision is based on my review of the chart including patient's history and current presentation, interview of the patient, mental status examination, and consideration of suicide  risk including evaluating suicidal ideation, plan, intent, suicidal or self-harm behaviors, risk factors, and protective factors. This judgment is based on our ability to directly address suicide risk, implement suicide prevention strategies and develop a safety plan while the patient is in the clinical setting. Please contact our team if there is a concern that risk level has changed.  Suicide risk assessment  Patient has following modifiable risk factors for suicide: untreated depression and social isolation, which we are addressing by outpatient services. Will work on other modifiable risk factors, unable to access collateral as he reports not having family or friends which makes disposition outside the hospital difficult.   Patient has following non-modifiable or demographic risk factors for suicide: male gender and psychiatric hospitalization  Patient has the following protective factors against suicide: no history of suicide attempts and no history of NSSIB   Thank you for this consult request. Recommendations have been communicated to the primary team.  We will csign off at this time.   Maryagnes Amos, FNP-PMHNP-BC  Psychiatric and Social History   Relevant Aspects of Hospital Course:  Admitted on 02/16/2024 for chest pain. He reported SI to the nursing staff, and psych was consulted. Actively followed by Cardiology, Palliative, and LTC team.   Patient Report:  The patient reports feeling "okay" and is preparing  for discharge today. He mentions, "I guess I'm doing okay. They tell me I'm going home today." When asked about his progress, he stated, "I'm better than where I was when I came in." The patient expresses his intention to continue taking his medication at discharge and denies any side effects or adverse reactions related to his most recent medication adjustment. He denies any suicidal ideation, homicidal ideation, or hallucinations at this time. The patient indicates feeling  stable and safe for discharge.  Psych ROS:  Depression: low mood, fleeting thought of death; no true intent, poor appetite Anxiety:  chronic anxiety Mania (lifetime and current): denied Psychosis: (lifetime and current): denied Psychiatric History:  Information collected from patient and chart review  Had a previous psych hospitalization few years ago. Currently not on any psych meds except for Klonopin. Never engaged in therapy  Single; never married. No kids.  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 Stopped working in 2017 when he started getting disability. No contact with any family.  Access to weapons: denied  Substance History Tobacco: 31.5 pack year history Alcohol use: Abstinent for the past 2 years Drug use: Used cocaine and marijuana; Last used cocaine 2 years ago; last used marijuana a year ago.    Exam Findings   Psychiatric Specialty Exam: Observed in the restroom, ambulating. He is alert and oriented, calm and cooperative.   Presentation  General Appearance: Appropriate for Environment  Eye Contact: Good Speech:Clear and Coherent; Normal Rate  Speech Volume:Normal  Handedness:Right   Mood and Affect  Mood:Depressed  Affect:Appropriate; Congruent   Thought Process  Thought Processes:Coherent; Linear  Descriptions of Associations:Intact  Orientation:Full (Time, Place and Person)  Thought Content:Logical  Hallucinations: Denies AVh   Ideas of Reference:None  Suicidal Thoughts: Denies  Homicidal Thoughts: Denies     Sensorium  Memory:Immediate Fair; Recent Fair; Remote Fair  Judgment:Fair Insight:Fair   Executive Functions  Concentration:Fair  Attention Span:Good  Recall:Fair  Fund of Knowledge:Good  Language:Good   Psychomotor Activity  Psychomotor Activity:No data recorded     Assets  Assets:Communication Skills; Desire for Improvement; Resilience   Sleep  Sleep:6 hours   Physical Exam: Vital  signs:  Temp:  [97.4 F (36.3 C)-97.8 F (36.6 C)] 97.8 F (36.6 C) (03/21 1240) Pulse Rate:  [93-109] 93 (03/21 0304) Resp:  [18-20] 20 (03/21 0304) BP: (81-113)/(63-84) 97/84 (03/21 1240) SpO2:  [94 %-100 %] 100 % (03/21 1240) Weight:  [75.5 kg] 75.5 kg (03/21 0539) Physical Exam HENT:     Head: Normocephalic.  Pulmonary:     Effort: Pulmonary effort is normal. No respiratory distress.  Skin:    General: Skin is warm and dry.  Neurological:     General: No focal deficit present.     Mental Status: He is alert and oriented to person, place, and time.  Psychiatric:        Mood and Affect: Mood normal.        Behavior: Behavior normal.     Blood pressure 97/84, pulse 93, temperature 97.8 F (36.6 C), temperature source Oral, resp. rate 20, height 5\' 8"  (1.727 m), weight 75.5 kg, SpO2 100%. Body mass index is 25.31 kg/m.   Other History   These have been pulled in through the EMR, reviewed, and updated if appropriate.   The patient's family history includes Alcohol abuse in his brother and father; CAD in his father; Drug abuse in his brother, brother, and father; Early death in his father; Glaucoma in  his mother; Heart attack (age of onset: 81) in his father; Heart disease in his father; Hypertension in his father; Mental illness in his father and mother; Vision loss in his mother.  Medical History: Past Medical History:  Diagnosis Date   Active smoker    AICD (automatic cardioverter/defibrillator) present    Alcohol abuse    Allergy July 2023   Anxiety    AR (allergic rhinitis)    Bipolar 1 disorder (HCC)    Cervical lymphadenitis 04/20/2021   CHF (congestive heart failure) (HCC)    Chronic bronchitis (HCC)    Chronic systolic heart failure (HCC)    Controlled substance agreement signed 10/22/2018   COPD (chronic obstructive pulmonary disease) (HCC)    COPD (chronic obstructive pulmonary disease) (HCC) 10/04/2015   Cough 12/31/2018   Crack cocaine use     Depression    Genital herpes    HIV (human immunodeficiency virus infection) (HCC) dx'd 08/1993   HLD (hyperlipidemia)    Hypertension    NICM (nonischemic cardiomyopathy) (HCC)    Echocardiogram 06/28/11: EF 30-35%, mild MR, mild LAE;  No CAD by coronary CT angiogram 3/12 at [facility]   NSVT (nonsustained ventricular tachycardia) (HCC)    PTSD (post-traumatic stress disorder)     Surgical History: Past Surgical History:  Procedure Laterality Date   CARDIAC DEFIBRILLATOR PLACEMENT  01/08/2018   ICD IMPLANT N/A 01/08/2018   Procedure: ICD IMPLANT;  Surgeon: Regan Lemming, MD;  Location: [facility] INVASIVE CV [facility];  Service: Cardiovascular;  Laterality: N/A;   RIGHT/LEFT HEART CATH AND CORONARY ANGIOGRAPHY N/A 01/03/2024   Procedure: RIGHT/LEFT HEART CATH AND CORONARY ANGIOGRAPHY;  Surgeon: Dolores Patty, MD;  Location: [facility] INVASIVE CV LAB;  Service: Cardiovascular;  Laterality: N/A;    Medications:   Current Facility-Administered Medications:    acetaminophen (TYLENOL) tablet 650 mg, 650 mg, Oral, Q6H PRN, 650 mg at 02/23/24 0027 **OR** acetaminophen (TYLENOL) suppository 650 mg, 650 mg, Rectal, Q6H PRN, Shalhoub, Deno Lunger, MD   albuterol (PROVENTIL) (2.5 MG/3ML) 0.083% nebulizer solution 2.5 mg, 2.5 mg, Nebulization, Q6H PRN, [name] Giovanni, MD, 2.5 mg at 03/06/24 1410   amiodarone (PACERONE) tablet 200 mg, 200 mg, Oral, Daily, Marolyn Haller, MD, 200 mg at 03/07/24 1029   ARIPiprazole (ABILIFY) tablet 7.5 mg, 7.5 mg, Oral, Daily, Starkes-Perry, Axiel Fjeld S, FNP, 7.5 mg at 03/07/24 1028   aspirin EC tablet 81 mg, 81 mg, Oral, Daily, [name] Giovanni, MD, 81 mg at 03/07/24 1029   bictegravir-emtricitabine-tenofovir AF (BIKTARVY) 50-200-25 MG per tablet 1 tablet, 1 tablet, Oral, Daily, [name] Giovanni, MD, 1 tablet at 03/07/24 1028   butalbital-acetaminophen-caffeine (FIORICET) 50-325-40 MG per tablet 1 tablet, 1 tablet, Oral, Q6H PRN, Marinda Elk, MD, 1  tablet at 03/01/24 0856   clonazepam (KLONOPIN) disintegrating tablet 0.125 mg, 0.125 mg, Oral, TID, [name] Giovanni, MD, 0.125 mg at 03/07/24 1029   dronabinol (MARINOL) capsule 5 mg, 5 mg, Oral, BID AC, [name] Giovanni, MD, 5 mg at 03/07/24 1243   empagliflozin (JARDIANCE) tablet 10 mg, 10 mg, Oral, QAC breakfast, [name] Giovanni, MD, 10 mg at 03/07/24 0800   feeding supplement (ENSURE ENLIVE / ENSURE PLUS) liquid 237 mL, 237 mL, Oral, BID BM, [place], Eric J, DO, 237 mL at 03/07/24 1256   finasteride (PROSCAR) tablet 5 mg, 5 mg, Oral, Daily, Rathore, Ulyess Blossom, MD, 5 mg at 03/07/24 1028   fluticasone furoate-vilanterol (BREO ELLIPTA) 200-25 MCG/ACT 1 puff, 1 puff, Inhalation, Daily, [name] Giovanni, MD, 1 puff at 03/07/24 [phone]  heparin injection 5,000 Units, 5,000 Units, Subcutaneous, Q8H, [place], Alvira Philips, DO, 5,000 Units at 03/07/24 0865   mexiletine (MEXITIL) capsule 250 mg, 250 mg, Oral, Q12H, [name] Giovanni, MD, 250 mg at 03/07/24 1028   midodrine (PROAMATINE) tablet 15 mg, 15 mg, Oral, TID, Alwyn Ren, MD, 15 mg at 03/07/24 1244   mirtazapine (REMERON) tablet 15 mg, 15 mg, Oral, QHS, Mariel Craft, MD, 15 mg at 03/06/24 2251   ondansetron [facility]) injection 4 mg, 4 mg, Intravenous, Q6H PRN, [place], Alvira Philips, DO, 4 mg at 03/07/24 1311   Oral care mouth rinse, 15 mL, Mouth Rinse, PRN, Narda Bonds, MD   oxyCODONE (Oxy IR/ROXICODONE) immediate release tablet 5 mg, 5 mg, Oral, Q4H PRN, [place], Alvira Philips, DO, 5 mg at 03/06/24 1518   polyethylene glycol (MIRALAX / GLYCOLAX) packet 17 g, 17 g, Oral, Daily, [name] Giovanni, MD, 17 g at 03/07/24 1030   sertraline (ZOLOFT) tablet 100 mg, 100 mg, Oral, Daily, Starkes-Perry, Sol Odor S, FNP, 100 mg at 03/07/24 1028   tamsulosin (FLOMAX) capsule 0.4 mg, 0.4 mg, Oral, BID, [name] Giovanni, MD, 0.4 mg at 03/07/24 1029   traZODone (DESYREL) tablet 50 mg, 50 mg, Oral, QHS PRN, Mariel Craft, MD, 50 mg at 03/04/24 2131    valACYclovir (VALTREX) tablet 500 mg, 500 mg, Oral, Daily, [name] Giovanni, MD, 500 mg at 03/07/24 1028  Allergies: No Known Allergies  ROS

## 2024-03-08 NOTE — Discharge Summary (Signed)
 Physician Discharge Summary  Ryan Long [name]:[id number] DOB: 25-Nov-1961 DOA: 02/16/2024  PCP: Cristino Martes, NP  Admit date: 02/16/2024 Discharge date: 03/07/2024 Admitted From: bills house Disposition: bills house Recommendations for Outpatient Follow-up:  Follow up with PCP in 1-2 weeks Please obtain BMP/CBC in one week  Home Health:none Equipment/Devices:none Discharge Condition:stable CODE STATUS:full Diet recommendation: cardiac Brief/Interim Summary:  63 y.o. male with medical history significant of systolic and diastolic congestive heart failure (Echo 09/2023 EF 20-25% with G1DD)  status post AICD (has been off since 01/28/2024), NSVT, HIV, hypertension, hyperlipidemia, COPD, anxiety, depression, bipolar disorder, substance abuse, BPH, CKD stage IIIa.  Presenting to [facility] emergency department with chest pain and shortness of breath.    Of note, patient was recently admitted by cardiology service 2/5-2/21 for worsening heart failure symptoms and VT storm.  Palliative care was consulted and patient was made DNR/DNI.  Patient had decided to pursue hospice and his ICD was inactivated.  He was discharged to group home with hospice.   Upon evaluation in the emergency department, case was discussed with cardiology and and the hospitalist was called to assess the patient for admission to the hospital.   Cardiology was consulted and patient was placed on a regimen of intravenous Lasix and fluid restriction.  Strict input and output monitoring was performed as well as daily weights.   Throughout the course of the hospitalization patient was also found to be extremely tearful and repeatedly resported suicidal ideation.  Psychiatry was consulted and upon their evaluation they additionally agreed that patient's suicidal ideation with plan was concerning.  Initially, plan was for patient to be discharged to a geriatric psychiatry bed but after consideration they felt the patient  was not a candidate.  Discharge Diagnoses:  Principal Problem:   Acute on chronic HFrEF (heart failure with reduced ejection fraction) (HCC) Active Problems:   HIV (human immunodeficiency virus infection) (HCC)   Depression with suicidal ideation   COPD (chronic obstructive pulmonary disease) (HCC)   Goals of care, counseling/discussion   Ventricular tachycardia (HCC)   Chest pain   Chronic kidney disease, stage 3a (HCC)  Acute on chronic HFrEF Patient with severely decreased LVEF measuring 20-25% with global hypokinesis and grade 1 diastolic dysfunction. Patient was treated with IV Lasix.  GDMT not possible due to secondary to chronic hypotension.  -Continue Jardiance   Chronic hypotension Likely related to low-output heart failure. Patient is managed on midodrine. Midodrine increased to 15 mg 3 times daily   Depression Suicidal ideation continue mirtazapine, trazodone, Zoloft and Abilify He denied suicidal ideations, homicidal ideations at the time of discharge.     CKD stage IIIa-monitor closely as an outpatient.  Continue midodrine to keep blood pressure up to avoid further worsening of kidneys.   Ventricular tachycardia Likely related to heart failure and severely reduced LVEF. Patient is s/p [employer]. Jude single chamber ICD which was turned off at previous admission secondary to transition to hospice after VT storm. Patient with multiple episodes of nonsustained ventricular tachycardia this admission. Patient is considering if he would like his ICD turned back on.  He has not made this decision at the time of discharge. -Continue Mexitil and amiodarone  COPD -Continue Breo Ellipta and albuterol   HIV -Continue Biktarvy    Estimated body mass index is 25.31 kg/m as calculated from the following:   Height as of this encounter: 5\' 8"  (1.727 m).   Weight as of this encounter: 75.5 kg.  Discharge Instructions  Discharge  Instructions     Diet - low sodium heart healthy    Complete by: As directed    Diet - low sodium heart healthy   Complete by: As directed    Diet - low sodium heart healthy   Complete by: As directed    Increase activity slowly   Complete by: As directed    Increase activity slowly   Complete by: As directed    Increase activity slowly   Complete by: As directed       Allergies as of 03/07/2024   No Known Allergies      Medication List     STOP taking these medications    clonazepam 0.125 MG disintegrating tablet Commonly known as: KLONOPIN   IBUPROFEN PO   LORazepam 0.5 MG tablet Commonly known as: ATIVAN   MENS MULTIPLUS PO       TAKE these medications    acetaminophen 325 MG tablet Commonly known as: TYLENOL Take 2 tablets (650 mg total) by mouth every 6 (six) hours as needed for mild pain (pain score 1-3) (or Fever >/= 101).   albuterol 108 (90 Base) MCG/ACT inhaler Commonly known as: VENTOLIN HFA Inhale 2 puffs into the lungs every 6 (six) hours as needed for wheezing or shortness of breath.   amiodarone 200 MG tablet Commonly known as: Pacerone Take 1 tablet (200 mg total) by mouth daily. Start taking on: March 09, 2024 What changed: Another medication with the same name was removed. Continue taking this medication, and follow the directions you see here.   ARIPiprazole 15 MG tablet Commonly known as: ABILIFY Take 0.5 tablets (7.5 mg total) by mouth daily.   aspirin EC 81 MG tablet Take 81 mg by mouth in the morning. Swallow whole.   Biktarvy 50-200-25 MG Tabs tablet Generic drug: bictegravir-emtricitabine-tenofovir AF Take 1 tablet by mouth daily. What changed: when to take this   Breo Ellipta 200-25 MCG/ACT Aepb Generic drug: fluticasone furoate-vilanterol Inhale 1 puff into the lungs daily.   dronabinol 5 MG capsule Commonly known as: MARINOL Take 1 capsule (5 mg total) by mouth 2 (two) times daily before lunch and supper.   empagliflozin 10 MG Tabs tablet Commonly known as:  Jardiance Take 1 tablet (10 mg total) by mouth daily before breakfast.   eplerenone 25 MG tablet Commonly known as: INSPRA Take 1 tablet (25 mg total) by mouth daily.   finasteride 5 MG tablet Commonly known as: PROSCAR TAKE 1 TABLET(5 MG) BY MOUTH DAILY What changed: See the new instructions.   Melatonin 10 MG Tabs Take 10 mg by mouth at bedtime.   mexiletine 250 MG capsule Commonly known as: MEXITIL Take 1 capsule (250 mg total) by mouth every 12 (twelve) hours.   midodrine 5 MG tablet Commonly known as: PROAMATINE Take 3 tablets (15 mg total) by mouth 3 (three) times daily. What changed:  medication strength how much to take when to take this   mirtazapine 15 MG tablet Commonly known as: REMERON Take 1 tablet (15 mg total) by mouth at bedtime.   ondansetron 4 MG tablet Commonly known as: Zofran Take 1 tablet (4 mg total) by mouth every 8 (eight) hours as needed for nausea or vomiting.   polyethylene glycol 17 g packet Commonly known as: MIRALAX / GLYCOLAX Take 17 g by mouth daily.   sertraline 100 MG tablet Commonly known as: ZOLOFT Take 1 tablet (100 mg total) by mouth daily.   tamsulosin 0.4 MG Caps capsule Commonly known as:  FLOMAX TAKE 2 CAPSULES(0.8 MG) BY MOUTH DAILY AFTER SUPPER What changed:  how much to take how to take this when to take this additional instructions   traZODone 50 MG tablet Commonly known as: DESYREL Take 1 tablet (50 mg total) by mouth at bedtime as needed for sleep.   triamcinolone ointment 0.5 % Commonly known as: KENALOG Apply 1 Application topically 2 (two) times daily. What changed: when to take this   valACYclovir 500 MG tablet Commonly known as: VALTREX Take 1 tablet (500 mg total) by mouth daily. What changed: when to take this        Contact information for follow-up providers     [facility] Follow up.   Contact information: [address] [facility] [place] [address] [phone]        R  Follow up.          Cristino Martes, NP Follow up.   Specialty: Nurse Practitioner Contact information: [address] [place] [address] [phone]         Marinus Maw, MD Follow up.   Specialty: Cardiology Contact information: [address] [place] [address] [phone]         [facility]. Call in 2 day(s).   Specialty: Behavioral Health Why: Show up to walk in clinic for medication management between 9a-1pm. Contact information: [address] [place] [address] [phone]             Contact information for after-discharge care     Destination     [facility] .   Service: Skilled Nursing Contact information: [address] [place] [address] [phone]                    No Known Allergies  Consultations: Psychiatry   Procedures/Studies: DG Chest 1 View Result Date: 02/22/2024 CLINICAL DATA:  Pulmonary edema. Shortness of breath and chest pain. EXAM: CHEST  1 VIEW COMPARISON:  02/16/2024. FINDINGS: The heart is enlarged and the mediastinal contour is within normal limits. A single lead pacemaker device is present over the left chest. There is a questionable trace right pleural effusion. Mild atelectasis is noted at the lung bases. No pneumothorax is seen. No acute osseous abnormality. IMPRESSION: 1. Questionable trace right pleural effusion with atelectasis at the lung bases. 2. Cardiomegaly. Electronically Signed   By: Thornell Sartorius M.D.   On: 02/22/2024 22:13   NM Pulmonary Perfusion Result Date: 02/18/2024 CLINICAL DATA:  Chest pain and shortness of breath. Elevated D-dimer. Renal insufficiency. EXAM: NUCLEAR MEDICINE PERFUSION LUNG SCAN TECHNIQUE: Perfusion images were obtained in multiple projections after intravenous injection of radiopharmaceutical. Ventilation scans intentionally deferred if perfusion scan and chest x-ray  adequate for interpretation during COVID 19 epidemic. RADIOPHARMACEUTICALS:  4.2 mCi Tc-90m MAA IV COMPARISON:  Chest x-ray on 02/16/2024 FINDINGS: No significant subsegmental or segmental perfusion defects in the lungs bilaterally. IMPRESSION: No evidence to suggest pulmonary embolism by nuclear medicine perfusion imaging. Electronically Signed   By: Irish Lack M.D.   On: 02/18/2024 14:54   VAS [place] LOWER EXTREMITY VENOUS (DVT) Result Date: 02/17/2024  Lower Venous DVT Study Patient Name:  Ryan Long  Date of Exam:   02/17/2024 Medical Rec #: [id number]              Accession #:    [id number] Date of Birth: 09/18/1961  Patient Gender: M Patient Age:   63 years Exam Location:  [facility] Procedure:      VAS [place] LOWER EXTREMITY VENOUS (DVT) Referring Phys: ERIC [place] --------------------------------------------------------------------------------  Indications: Edema, SOB, and Chest pain, HFrEF, s/p AICD 2019.  Comparison Study: No prior exam. Performing Technologist: Fernande Bras  Examination Guidelines: A complete evaluation includes B-mode imaging, spectral Doppler, color Doppler, and power Doppler as needed of all accessible portions of each vessel. Bilateral testing is considered an integral part of a complete examination. Limited examinations for reoccurring indications may be performed as noted. The reflux portion of the exam is performed with the patient in reverse Trendelenburg.  +---------+---------------+---------+-----------+----------+--------------+ RIGHT    CompressibilityPhasicitySpontaneityPropertiesThrombus Aging +---------+---------------+---------+-----------+----------+--------------+ CFV      Full           Yes      Yes                                 +---------+---------------+---------+-----------+----------+--------------+ SFJ      Full           Yes      Yes                                  +---------+---------------+---------+-----------+----------+--------------+ FV Prox  Full                                                        +---------+---------------+---------+-----------+----------+--------------+ FV Mid   Full                                                        +---------+---------------+---------+-----------+----------+--------------+ FV DistalFull                                                        +---------+---------------+---------+-----------+----------+--------------+ PFV      Full                                                        +---------+---------------+---------+-----------+----------+--------------+ POP      Full           Yes      Yes                                 +---------+---------------+---------+-----------+----------+--------------+ PTV      Full                                                        +---------+---------------+---------+-----------+----------+--------------+  PERO     Full                                                        +---------+---------------+---------+-----------+----------+--------------+   +---------+---------------+---------+-----------+----------+--------------+ LEFT     CompressibilityPhasicitySpontaneityPropertiesThrombus Aging +---------+---------------+---------+-----------+----------+--------------+ CFV      Full           Yes      Yes                                 +---------+---------------+---------+-----------+----------+--------------+ SFJ      Full           Yes      Yes                                 +---------+---------------+---------+-----------+----------+--------------+ FV Prox  Full                                                        +---------+---------------+---------+-----------+----------+--------------+ FV Mid   Full                                                         +---------+---------------+---------+-----------+----------+--------------+ FV DistalFull                                                        +---------+---------------+---------+-----------+----------+--------------+ PFV      Full                                                        +---------+---------------+---------+-----------+----------+--------------+ POP      Full           Yes      Yes                                 +---------+---------------+---------+-----------+----------+--------------+ PTV      Full                                                        +---------+---------------+---------+-----------+----------+--------------+ PERO     Full                                                        +---------+---------------+---------+-----------+----------+--------------+  Summary: BILATERAL: - No evidence of deep vein thrombosis seen in the lower extremities, bilaterally. -No evidence of popliteal cyst, bilaterally.   *See table(s) above for measurements and observations. Electronically signed by Coral Else MD on 02/17/2024 at 9:27:36 PM.    Final    DG Chest 2 View Result Date: 02/16/2024 CLINICAL DATA:  Chest pain shortness of breath for several hours, initial encounter EXAM: CHEST - 2 VIEW COMPARISON:  01/23/2024 FINDINGS: Cardiac shadow is enlarged but stable. Defibrillator is again seen. Lungs are well aerated bilaterally. No bony abnormality is noted. IMPRESSION: No active cardiopulmonary disease. Electronically Signed   By: Alcide Clever M.D.   On: 02/16/2024 20:40   (Echo, Carotid, EGD, Colonoscopy, ERCP)    Subjective:  He is calm anxious as to what the next step would be he feels he is doing okay denied suicidal or homicidal ideations Discharge Exam: Vitals:   03/07/24 0304 03/07/24 1240  BP: 104/63 97/84  Pulse: 93   Resp: 20   Temp: (!) 97.5 F (36.4 C) 97.8 F (36.6 C)  SpO2: 94% 100%   Vitals:   03/06/24 2225 03/07/24 0304  03/07/24 0539 03/07/24 1240  BP: 113/65 104/63  97/84  Pulse: (!) 109 93    Resp:  20    Temp:  (!) 97.5 F (36.4 C)  97.8 F (36.6 C)  TempSrc:  Oral  Oral  SpO2:  94%  100%  Weight:   75.5 kg   Height:        General: Pt is alert, awake, not in acute distress Cardiovascular: RRR, S1/S2 +, no rubs, no gallops Respiratory: CTA bilaterally, no wheezing, no rhonchi Abdominal: Soft, NT, ND, bowel sounds + Extremities: no edema, no cyanosis    The results of significant diagnostics from this hospitalization (including imaging, microbiology, ancillary and laboratory) are listed below for reference.     Microbiology: No results found for this or any previous visit (from the past 240 hours).   Labs: BNP (last 3 results) Recent Labs    02/20/24 0521 02/21/24 0529 02/22/24 0455  BNP 1,108.7* 1,612.2* 1,651.0*   Basic Metabolic Panel: Recent Labs  Lab 03/04/24 1014 03/05/24 0523 03/07/24 0449  NA  --  137 138  K 3.5 4.3 3.7  CL  --  102 108  CO2  --  22 20*  GLUCOSE  --  92 85  BUN  --  28* 28*  CREATININE  --  1.70* 1.60*  CALCIUM  --  8.6* 8.2*  MG 2.2  --   --    Liver Function Tests: No results for input(s): "AST", "ALT", "ALKPHOS", "BILITOT", "PROT", "ALBUMIN" in the last 168 hours. No results for input(s): "LIPASE", "AMYLASE" in the last 168 hours. No results for input(s): "AMMONIA" in the last 168 hours. CBC: Recent Labs  Lab 03/05/24 0523  WBC 2.7*  HGB 17.9*  HCT 52.2*  MCV 96.3  PLT 167   Cardiac Enzymes: No results for input(s): "CKTOTAL", "CKMB", "CKMBINDEX", "TROPONINI" in the last 168 hours. BNP: Invalid input(s): "POCBNP" CBG: No results for input(s): "GLUCAP" in the last 168 hours. D-Dimer No results for input(s): "DDIMER" in the last 72 hours. Hgb A1c No results for input(s): "HGBA1C" in the last 72 hours. Lipid Profile No results for input(s): "CHOL", "HDL", "LDLCALC", "TRIG", "CHOLHDL", "LDLDIRECT" in the last 72 hours. Thyroid  function studies No results for input(s): "TSH", "T4TOTAL", "T3FREE", "THYROIDAB" in the last 72 hours.  Invalid input(s): "FREET3" Anemia work up No results  for input(s): "VITAMINB12", "FOLATE", "FERRITIN", "TIBC", "IRON", "RETICCTPCT" in the last 72 hours. Urinalysis    Component Value Date/Time   COLORURINE COLORLESS (A) 02/16/2024 0300   APPEARANCEUR CLEAR 02/16/2024 0300   LABSPEC 1.005 02/16/2024 0300   PHURINE 5.0 02/16/2024 0300   GLUCOSEU 150 (A) 02/16/2024 0300   HGBUR NEGATIVE 02/16/2024 0300   BILIRUBINUR NEGATIVE 02/16/2024 0300   BILIRUBINUR Negative 06/15/2016 1542   KETONESUR NEGATIVE 02/16/2024 0300   PROTEINUR NEGATIVE 02/16/2024 0300   UROBILINOGEN 0.2 08/01/2021 0834   NITRITE NEGATIVE 02/16/2024 0300   LEUKOCYTESUR NEGATIVE 02/16/2024 0300   Sepsis Labs Recent Labs  Lab 03/05/24 0523  WBC 2.7*   Microbiology No results found for this or any previous visit (from the past 240 hours).   Time coordinating discharge: 39 min  SIGNED:   Alwyn Ren, MD  [facility] 03/08/2024, 5:27 PM

## 2024-03-12 ENCOUNTER — Encounter: Payer: 59 | Admitting: Cardiology

## 2024-03-12 ENCOUNTER — Ambulatory Visit: Payer: 59 | Attending: Cardiology | Admitting: Cardiology

## 2024-03-12 ENCOUNTER — Encounter: Payer: Self-pay | Admitting: Cardiology

## 2024-03-12 VITALS — BP 84/67 | HR 99 | Ht 68.0 in | Wt 172.6 lb

## 2024-03-12 DIAGNOSIS — I472 Ventricular tachycardia, unspecified: Secondary | ICD-10-CM | POA: Diagnosis not present

## 2024-03-12 DIAGNOSIS — I5022 Chronic systolic (congestive) heart failure: Secondary | ICD-10-CM | POA: Diagnosis not present

## 2024-03-12 DIAGNOSIS — J4489 Other specified chronic obstructive pulmonary disease: Secondary | ICD-10-CM | POA: Diagnosis not present

## 2024-03-12 DIAGNOSIS — I110 Hypertensive heart disease with heart failure: Secondary | ICD-10-CM | POA: Diagnosis not present

## 2024-03-12 DIAGNOSIS — Z720 Tobacco use: Secondary | ICD-10-CM | POA: Diagnosis not present

## 2024-03-12 DIAGNOSIS — Z0001 Encounter for general adult medical examination with abnormal findings: Secondary | ICD-10-CM | POA: Diagnosis not present

## 2024-03-12 DIAGNOSIS — Z131 Encounter for screening for diabetes mellitus: Secondary | ICD-10-CM | POA: Diagnosis not present

## 2024-03-12 DIAGNOSIS — E782 Mixed hyperlipidemia: Secondary | ICD-10-CM | POA: Diagnosis not present

## 2024-03-12 DIAGNOSIS — I428 Other cardiomyopathies: Secondary | ICD-10-CM | POA: Diagnosis not present

## 2024-03-12 DIAGNOSIS — Z79899 Other long term (current) drug therapy: Secondary | ICD-10-CM | POA: Diagnosis not present

## 2024-03-12 MED ORDER — ONDANSETRON HCL 4 MG PO TABS: 4.0000 mg | ORAL_TABLET | Freq: Three times a day (TID) | ORAL | 0 refills | Status: DC | PRN

## 2024-03-12 NOTE — Progress Notes (Signed)
 Electrophysiology Office Note:   Date:  03/12/2024  ID:  Ryan Long, DOB 1961-03-19, MRN [id number]  Primary Cardiologist: Ryan Meres, MD Primary Heart Failure: None Electrophysiologist: Ryan Casale [name], MD      History of Present Illness:   Ryan Long is a 63 y.o. male with h/o HIV, hypertension, depression, bipolar disease, chronic systolic heart failure, polysubstance abuse, ventricular tachycardia seen today for routine electrophysiology followup.   Since last being seen in our clinic the patient reports continued fatigue.  He is mildly short of breath.  He is not having any major issues since being on hospice care.  His medications have been continued.  He is okay with stopping some of his medications.  His ICD is off.  He does want to continue with his amiodarone and mexiletine.  In the future, he may opt to stop these medications.  This can be done by his primary physician.  he denies chest pain, palpitations, dyspnea, PND, orthopnea, nausea, vomiting, dizziness, syncope, edema, weight gain, or early satiety.   Review of systems complete and found to be negative unless listed in HPI.      EP Information / Studies Reviewed:    EKG is ordered today. Personal review as below.  EKG Interpretation Date/Time:  Wednesday March 12 2024 14:43:05 EDT Ventricular Rate:  99 PR Interval:  230 QRS Duration:  118 QT Interval:  370 QTC Calculation: 474 R Axis:   104  Text Interpretation: Sinus rhythm with 1st degree A-V block Possible Left atrial enlargement Left ventricular hypertrophy with QRS widening ( Cornell product ) Lateral infarct (cited on or before 22-Feb-2024) Inferior infarct , age undetermined When compared with ECG of 22-Feb-2024 15:08, No significant change since last tracing Confirmed by Ryan Long, Ryan Long ([id number]) on 03/12/2024 2:45:46 PM   ICD Interrogation-  reviewed in detail today,  See PACEART report.  Device History: Abbott Single Chamber  ICD implanted for CHF History of appropriate therapy: Yes History of AAD therapy: Yes; currently on amiodarone    Risk Assessment/Calculations:            Physical Exam:   VS:  BP (!) 84/67 (BP Location: Left Arm, Patient Position: Sitting, Cuff Size: Normal)   Pulse 99   Ht 5\' 8"  (1.727 m)   Wt 172 lb 9.6 oz (78.3 kg)   SpO2 98%   BMI 26.24 kg/m    Wt Readings from Last 3 Encounters:  03/12/24 172 lb 9.6 oz (78.3 kg)  03/07/24 166 lb 7.2 oz (75.5 kg)  02/08/24 191 lb 4.8 oz (86.8 kg)     GEN: Well nourished, well developed in no acute distress NECK: No JVD; No carotid bruits CARDIAC: Regular rate and rhythm, no murmurs, rubs, gallops RESPIRATORY:  Clear to auscultation without rales, wheezing or rhonchi  ABDOMEN: Soft, non-tender, non-distended EXTREMITIES:  No edema; No deformity   ASSESSMENT AND PLAN:    Chronic systolic dysfunction s/p Abbott single chamber ICD  euvolemic today Stable on an appropriate medical regimen Normal ICD function See Pace Art report No changes today Status stable.  Lead parameters, sensing, threshold, impedance within normal limits.  ICD is off.  As he is on hospice care, Ryan Long stop Jardiance and eplerenone.  2.  Ventricular tachycardia: Currently on amiodarone.  ICD has been turned off as the patient is with hospice care.  3.  Polysubstance abuse: Complete cessation encouraged  Disposition:   Follow up with EP APP in 12 months   Signed, Ryan Devins [name], MD

## 2024-03-12 NOTE — Patient Instructions (Addendum)
 Medication Instructions:  Your physician has recommended you make the following change in your medication:  STOP Jardiance STOP Eplerenone   *If you need a refill on your cardiac medications before your next appointment, please call your pharmacy*   Lab Work: None ordered  If you have any lab test that is abnormal or we need to change your treatment, we will call you to review the results.   Testing/Procedures: None ordered   Follow-Up: At [facility], you and your health needs are our priority.  As part of our continuing mission to provide you with exceptional heart care, we have created designated Provider Care Teams.  These Care Teams include your primary Cardiologist (physician) and Advanced Practice Providers (APPs -  Physician Assistants and Nurse Practitioners) who all work together to provide you with the care you need, when you need it.  Your next appointment:   1 year(s)  The format for your next appointment:   In Person  Provider:   You will see one of the following Advanced Practice Providers on your designated Care Team:   Francis Dowse, [place] "Mardelle Matte" [place], [place] Canary Brim, NP     Thank you for choosing [facility]!!   Dory Horn, RN [phone]

## 2024-03-13 ENCOUNTER — Other Ambulatory Visit (HOSPITAL_COMMUNITY): Payer: Self-pay

## 2024-03-17 ENCOUNTER — Other Ambulatory Visit: Payer: Self-pay

## 2024-03-17 ENCOUNTER — Encounter (HOSPITAL_COMMUNITY): Payer: Self-pay | Admitting: Emergency Medicine

## 2024-03-17 ENCOUNTER — Inpatient Hospital Stay (HOSPITAL_COMMUNITY)
Admission: EM | Admit: 2024-03-17 | Discharge: 2024-03-23 | DRG: 281 | Disposition: A | Discharge status: Home / Self Care | Attending: Internal Medicine | Admitting: Internal Medicine

## 2024-03-17 DIAGNOSIS — I5023 Acute on chronic systolic (congestive) heart failure: Principal | ICD-10-CM | POA: Diagnosis present

## 2024-03-17 DIAGNOSIS — N400 Enlarged prostate without lower urinary tract symptoms: Secondary | ICD-10-CM | POA: Diagnosis present

## 2024-03-17 DIAGNOSIS — M7918 Myalgia, other site: Secondary | ICD-10-CM | POA: Diagnosis present

## 2024-03-17 DIAGNOSIS — Z1152 Encounter for screening for COVID-19: Secondary | ICD-10-CM

## 2024-03-17 DIAGNOSIS — I5084 End stage heart failure: Secondary | ICD-10-CM | POA: Diagnosis present

## 2024-03-17 DIAGNOSIS — R000 Tachycardia, unspecified: Secondary | ICD-10-CM | POA: Diagnosis not present

## 2024-03-17 DIAGNOSIS — I440 Atrioventricular block, first degree: Secondary | ICD-10-CM | POA: Diagnosis present

## 2024-03-17 DIAGNOSIS — K5792 Diverticulitis of intestine, part unspecified, without perforation or abscess without bleeding: Secondary | ICD-10-CM | POA: Diagnosis not present

## 2024-03-17 DIAGNOSIS — I5043 Acute on chronic combined systolic (congestive) and diastolic (congestive) heart failure: Secondary | ICD-10-CM | POA: Diagnosis not present

## 2024-03-17 DIAGNOSIS — D751 Secondary polycythemia: Secondary | ICD-10-CM | POA: Diagnosis present

## 2024-03-17 DIAGNOSIS — Z821 Family history of blindness and visual loss: Secondary | ICD-10-CM

## 2024-03-17 DIAGNOSIS — J432 Centrilobular emphysema: Secondary | ICD-10-CM | POA: Diagnosis not present

## 2024-03-17 DIAGNOSIS — K7460 Unspecified cirrhosis of liver: Secondary | ICD-10-CM | POA: Diagnosis present

## 2024-03-17 DIAGNOSIS — Z8249 Family history of ischemic heart disease and other diseases of the circulatory system: Secondary | ICD-10-CM

## 2024-03-17 DIAGNOSIS — Z813 Family history of other psychoactive substance abuse and dependence: Secondary | ICD-10-CM

## 2024-03-17 DIAGNOSIS — F4310 Post-traumatic stress disorder, unspecified: Secondary | ICD-10-CM | POA: Diagnosis present

## 2024-03-17 DIAGNOSIS — J449 Chronic obstructive pulmonary disease, unspecified: Secondary | ICD-10-CM | POA: Diagnosis not present

## 2024-03-17 DIAGNOSIS — N1831 Chronic kidney disease, stage 3a: Secondary | ICD-10-CM | POA: Diagnosis not present

## 2024-03-17 DIAGNOSIS — I4891 Unspecified atrial fibrillation: Secondary | ICD-10-CM | POA: Diagnosis not present

## 2024-03-17 DIAGNOSIS — Z21 Asymptomatic human immunodeficiency virus [HIV] infection status: Secondary | ICD-10-CM | POA: Diagnosis not present

## 2024-03-17 DIAGNOSIS — I110 Hypertensive heart disease with heart failure: Secondary | ICD-10-CM | POA: Diagnosis not present

## 2024-03-17 DIAGNOSIS — E871 Hypo-osmolality and hyponatremia: Secondary | ICD-10-CM | POA: Diagnosis not present

## 2024-03-17 DIAGNOSIS — I5082 Biventricular heart failure: Secondary | ICD-10-CM | POA: Diagnosis present

## 2024-03-17 DIAGNOSIS — Z66 Do not resuscitate: Secondary | ICD-10-CM | POA: Diagnosis not present

## 2024-03-17 DIAGNOSIS — I21A1 Myocardial infarction type 2: Secondary | ICD-10-CM | POA: Diagnosis not present

## 2024-03-17 DIAGNOSIS — I130 Hypertensive heart and chronic kidney disease with heart failure and stage 1 through stage 4 chronic kidney disease, or unspecified chronic kidney disease: Secondary | ICD-10-CM | POA: Diagnosis not present

## 2024-03-17 DIAGNOSIS — I428 Other cardiomyopathies: Secondary | ICD-10-CM | POA: Diagnosis not present

## 2024-03-17 DIAGNOSIS — I517 Cardiomegaly: Secondary | ICD-10-CM | POA: Diagnosis not present

## 2024-03-17 DIAGNOSIS — K5732 Diverticulitis of large intestine without perforation or abscess without bleeding: Secondary | ICD-10-CM | POA: Diagnosis not present

## 2024-03-17 DIAGNOSIS — R3911 Hesitancy of micturition: Secondary | ICD-10-CM | POA: Diagnosis not present

## 2024-03-17 DIAGNOSIS — D6959 Other secondary thrombocytopenia: Secondary | ICD-10-CM | POA: Diagnosis present

## 2024-03-17 DIAGNOSIS — R079 Chest pain, unspecified: Secondary | ICD-10-CM | POA: Diagnosis not present

## 2024-03-17 DIAGNOSIS — R188 Other ascites: Secondary | ICD-10-CM | POA: Diagnosis present

## 2024-03-17 DIAGNOSIS — Z95810 Presence of automatic (implantable) cardiac defibrillator: Secondary | ICD-10-CM

## 2024-03-17 DIAGNOSIS — I3139 Other pericardial effusion (noninflammatory): Secondary | ICD-10-CM | POA: Diagnosis not present

## 2024-03-17 DIAGNOSIS — F319 Bipolar disorder, unspecified: Secondary | ICD-10-CM | POA: Diagnosis not present

## 2024-03-17 DIAGNOSIS — I499 Cardiac arrhythmia, unspecified: Secondary | ICD-10-CM | POA: Diagnosis not present

## 2024-03-17 DIAGNOSIS — Z818 Family history of other mental and behavioral disorders: Secondary | ICD-10-CM

## 2024-03-17 DIAGNOSIS — D72819 Decreased white blood cell count, unspecified: Secondary | ICD-10-CM | POA: Diagnosis present

## 2024-03-17 DIAGNOSIS — I509 Heart failure, unspecified: Secondary | ICD-10-CM | POA: Diagnosis not present

## 2024-03-17 DIAGNOSIS — F1490 Cocaine use, unspecified, uncomplicated: Secondary | ICD-10-CM | POA: Diagnosis present

## 2024-03-17 DIAGNOSIS — Z6825 Body mass index (BMI) 25.0-25.9, adult: Secondary | ICD-10-CM

## 2024-03-17 DIAGNOSIS — N401 Enlarged prostate with lower urinary tract symptoms: Secondary | ICD-10-CM | POA: Diagnosis not present

## 2024-03-17 DIAGNOSIS — I480 Paroxysmal atrial fibrillation: Secondary | ICD-10-CM | POA: Diagnosis present

## 2024-03-17 DIAGNOSIS — I472 Ventricular tachycardia, unspecified: Secondary | ICD-10-CM | POA: Diagnosis present

## 2024-03-17 DIAGNOSIS — I10 Essential (primary) hypertension: Secondary | ICD-10-CM | POA: Diagnosis not present

## 2024-03-17 DIAGNOSIS — F17210 Nicotine dependence, cigarettes, uncomplicated: Secondary | ICD-10-CM | POA: Diagnosis present

## 2024-03-17 DIAGNOSIS — E785 Hyperlipidemia, unspecified: Secondary | ICD-10-CM | POA: Diagnosis not present

## 2024-03-17 DIAGNOSIS — E876 Hypokalemia: Secondary | ICD-10-CM | POA: Diagnosis not present

## 2024-03-17 DIAGNOSIS — K7689 Other specified diseases of liver: Secondary | ICD-10-CM | POA: Diagnosis not present

## 2024-03-17 DIAGNOSIS — F419 Anxiety disorder, unspecified: Secondary | ICD-10-CM | POA: Diagnosis present

## 2024-03-17 DIAGNOSIS — B20 Human immunodeficiency virus [HIV] disease: Secondary | ICD-10-CM | POA: Diagnosis present

## 2024-03-17 DIAGNOSIS — Z7951 Long term (current) use of inhaled steroids: Secondary | ICD-10-CM

## 2024-03-17 DIAGNOSIS — I9589 Other hypotension: Secondary | ICD-10-CM | POA: Diagnosis present

## 2024-03-17 DIAGNOSIS — R54 Age-related physical debility: Secondary | ICD-10-CM | POA: Diagnosis present

## 2024-03-17 DIAGNOSIS — Z79899 Other long term (current) drug therapy: Secondary | ICD-10-CM

## 2024-03-17 DIAGNOSIS — Z515 Encounter for palliative care: Secondary | ICD-10-CM

## 2024-03-17 DIAGNOSIS — J4489 Other specified chronic obstructive pulmonary disease: Secondary | ICD-10-CM | POA: Diagnosis not present

## 2024-03-17 DIAGNOSIS — R0789 Other chest pain: Secondary | ICD-10-CM | POA: Diagnosis not present

## 2024-03-17 DIAGNOSIS — Z811 Family history of alcohol abuse and dependence: Secondary | ICD-10-CM

## 2024-03-17 DIAGNOSIS — K760 Fatty (change of) liver, not elsewhere classified: Secondary | ICD-10-CM | POA: Diagnosis present

## 2024-03-17 NOTE — ED Provider Notes (Incomplete)
  EMERGENCY DEPARTMENT AT [facility] Provider Note   CSN: [id number] Arrival date & time: 03/17/24  2339     History {Add pertinent medical, surgical, social history, OB history to HPI:1} Chief Complaint  Patient presents with  . Chest Pain    Ryan Long is a 63 y.o. male.  Patient presents with diffuse complaints.  He reports that he does not feel well from his neck down to his groin.  He is experiencing left-sided chest pain.  Additionally he is experiencing lower abdominal pain.  Patient reports that he has been sick for a while.  He has lost his sense of smell, reports that this occurred when he was in the hospital last time.  Since then he has not been able to eat much because things "taste bad".  He reports that he is currently only eating oatmeal and applesauce.       Home Medications Prior to Admission medications   Medication Sig Start Date End Date Taking? Authorizing Provider  acetaminophen (TYLENOL) 325 MG tablet Take 2 tablets (650 mg total) by mouth every 6 (six) hours as needed for mild pain (pain score 1-3) (or Fever >/= 101). 03/07/24   Alwyn Ren, MD  albuterol (VENTOLIN HFA) 108 (90 Base) MCG/ACT inhaler Inhale 2 puffs into the lungs every 6 (six) hours as needed for wheezing or shortness of breath. 07/30/23   Storm Frisk, MD  amiodarone (PACERONE) 200 MG tablet Take 1 tablet (200 mg total) by mouth daily. 03/09/24   Jonita Albee, PA-C  ARIPiprazole (ABILIFY) 15 MG tablet Take 0.5 tablets (7.5 mg total) by mouth daily. Patient not taking: Reported on 03/12/2024 03/07/24   Alwyn Ren, MD  aspirin EC 81 MG tablet Take 81 mg by mouth in the morning. Swallow whole.    [provider]  bictegravir-emtricitabine-tenofovir AF (BIKTARVY) 50-200-25 MG TABS tablet Take 1 tablet by mouth daily. Patient taking differently: Take 1 tablet by mouth in the morning. 08/15/23   Randall Hiss, MD  dronabinol  (MARINOL) 5 MG capsule Take 1 capsule (5 mg total) by mouth 2 (two) times daily before lunch and supper. 02/09/24   Jonita Albee, PA-C  eplerenone (INSPRA) 25 MG tablet Take 1 tablet (25 mg total) by mouth daily. 02/09/24   Jonita Albee, PA-C  finasteride (PROSCAR) 5 MG tablet TAKE 1 TABLET(5 MG) BY MOUTH DAILY Patient taking differently: Take 5 mg by mouth in the morning. 03/15/23   Storm Frisk, MD  fluticasone furoate-vilanterol (BREO ELLIPTA) 200-25 MCG/ACT AEPB Inhale 1 puff into the lungs daily.    [provider]  Melatonin 10 MG TABS Take 10 mg by mouth at bedtime.    [provider]  mexiletine (MEXITIL) 250 MG capsule Take 1 capsule (250 mg total) by mouth every 12 (twelve) hours. 02/08/24   Jonita Albee, PA-C  midodrine (PROAMATINE) 5 MG tablet Take 3 tablets (15 mg total) by mouth 3 (three) times daily. 03/07/24   Alwyn Ren, MD  mirtazapine (REMERON) 15 MG tablet Take 1 tablet (15 mg total) by mouth at bedtime. 03/07/24   Alwyn Ren, MD  ondansetron (ZOFRAN) 4 MG tablet Take 1 tablet (4 mg total) by mouth every 8 (eight) hours as needed for nausea or vomiting. 03/12/24   Camnitz, Andree Coss, MD  polyethylene glycol (MIRALAX / GLYCOLAX) 17 g packet Take 17 g by mouth daily. 02/09/24   Jonita Albee, PA-C  sertraline (ZOLOFT) 100 MG tablet Take 1 tablet (100 mg total) by mouth daily. Patient not taking: Reported on 03/12/2024 03/07/24   Alwyn Ren, MD  tamsulosin (FLOMAX) 0.4 MG CAPS capsule TAKE 2 CAPSULES(0.8 MG) BY MOUTH DAILY AFTER SUPPER Patient taking differently: Take 0.4 mg by mouth 2 (two) times daily. 07/26/23   Storm Frisk, MD  traZODone (DESYREL) 50 MG tablet Take 1 tablet (50 mg total) by mouth at bedtime as needed for sleep. Patient not taking: Reported on 03/12/2024 03/07/24   Alwyn Ren, MD  triamcinolone ointment (KENALOG) 0.5 % Apply 1 Application topically 2 (two) times daily. Patient  taking differently: Apply 1 Application topically daily. 08/15/23   Storm Frisk, MD  valACYclovir (VALTREX) 500 MG tablet Take 1 tablet (500 mg total) by mouth daily. Patient taking differently: Take 500 mg by mouth in the morning. 11/15/22   Storm Frisk, MD      Allergies    Patient has no known allergies.    Review of Systems   Review of Systems  Physical Exam Updated Vital Signs BP (!) 87/72   Pulse (!) 103   Temp 98.1 F (36.7 C) (Oral)   Resp (!) 26   Ht 5\' 8"  (1.727 m)   Wt 77.1 kg   SpO2 100%   BMI 25.85 kg/m  Physical Exam Vitals and nursing note reviewed.  Constitutional:      General: He is not in acute distress.    Appearance: He is well-developed.  HENT:     Head: Normocephalic and atraumatic.     Mouth/Throat:     Mouth: Mucous membranes are moist.  Eyes:     General: Vision grossly intact. Gaze aligned appropriately.     Extraocular Movements: Extraocular movements intact.     Conjunctiva/sclera: Conjunctivae normal.  Cardiovascular:     Rate and Rhythm: Normal rate and regular rhythm.     Pulses: Normal pulses.     Heart sounds: Normal heart sounds, S1 normal and S2 normal. No murmur heard.    No friction rub. No gallop.  Pulmonary:     Effort: Pulmonary effort is normal. No respiratory distress.     Breath sounds: Normal breath sounds.  Chest:     Chest wall: Tenderness present.  Abdominal:     Palpations: Abdomen is soft.     Tenderness: There is no abdominal tenderness. There is no guarding or rebound.     Hernia: No hernia is present.  Musculoskeletal:        General: No swelling.     Cervical back: Full passive range of motion without pain, normal range of motion and neck supple. No pain with movement, spinous process tenderness or muscular tenderness. Normal range of motion.     Right lower leg: No edema.     Left lower leg: No edema.  Skin:    General: Skin is warm and dry.     Capillary Refill: Capillary refill takes less than  2 seconds.     Findings: No ecchymosis, erythema, lesion or wound.  Neurological:     Mental Status: He is alert and oriented to person, place, and time.     GCS: GCS eye subscore is 4. GCS verbal subscore is 5. GCS motor subscore is 6.     Cranial Nerves: Cranial nerves 2-12 are intact.     Sensory: Sensation is intact.     Motor: Motor function is intact. No weakness or abnormal muscle tone.  Coordination: Coordination is intact.  Psychiatric:        Mood and Affect: Mood normal.        Speech: Speech normal.        Behavior: Behavior normal.     ED Results / Procedures / Treatments   Labs (all labs ordered are listed, but only abnormal results are displayed) Labs Reviewed  RESP PANEL BY RT-PCR (RSV, FLU A&B, COVID)  RVPGX2  CBC WITH DIFFERENTIAL/PLATELET  BASIC METABOLIC PANEL WITH GFR  HEPATIC FUNCTION PANEL  LIPASE, BLOOD  MAGNESIUM  BRAIN NATRIURETIC PEPTIDE  AMMONIA  URINALYSIS, ROUTINE W REFLEX MICROSCOPIC  TROPONIN I (HIGH SENSITIVITY)    EKG None  Radiology No results found.  Procedures Procedures  {Document cardiac monitor, telemetry assessment procedure when appropriate:1}  Medications Ordered in ED Medications - No data to display  ED Course/ Medical Decision Making/ A&P   {   Click here for ABCD2, HEART and other calculatorsREFRESH Note before signing :1}                              Medical Decision Making Amount and/or Complexity of Data Reviewed Labs: ordered. Radiology: ordered.   ***  {Document critical care time when appropriate:1} {Document review of labs and clinical decision tools ie heart score, Chads2Vasc2 etc:1}  {Document your independent review of radiology images, and any outside records:1} {Document your discussion with family members, caretakers, and with consultants:1} {Document social determinants of health affecting pt's care:1} {Document your decision making why or why not admission, treatments were needed:1} Final  Clinical Impression(s) / ED Diagnoses Final diagnoses:  None    Rx / DC Orders ED Discharge Orders     None

## 2024-03-17 NOTE — ED Triage Notes (Signed)
 Pt BIB EMS from home for intermittent centralized CP, non-radiating. Reports nausea and ShOB, denies vomiting. States "I feel bad from my neck to my waste". Given 4mg  of Zofran, 4mg  of Morphine, 325 ASA by EMS. EMS reports the pt has an ICD that is currently turned off.

## 2024-03-17 NOTE — ED Provider Notes (Signed)
 [place] EMERGENCY DEPARTMENT AT [facility] Provider Note   CSN: [id number] Arrival date & time: 03/17/24  2339     History  Chief Complaint  Patient presents with   Chest Pain    Ryan Long is a 63 y.o. male.  Patient presents with diffuse complaints.  He reports that he does not feel well from his neck down to his groin.  He is experiencing left-sided chest pain.  Additionally he is experiencing lower abdominal pain.  Patient reports that he has been sick for a while.  He has lost his sense of smell, reports that this occurred when he was in the hospital last time.  Since then he has not been able to eat much because things "taste bad".  He reports that he is currently only eating oatmeal and applesauce.       Home Medications Prior to Admission medications   Medication Sig Start Date End Date Taking? Authorizing Provider  acetaminophen (TYLENOL) 325 MG tablet Take 2 tablets (650 mg total) by mouth every 6 (six) hours as needed for mild pain (pain score 1-3) (or Fever >/= 101). 03/07/24   Alwyn Ren, MD  albuterol (VENTOLIN HFA) 108 (90 Base) MCG/ACT inhaler Inhale 2 puffs into the lungs every 6 (six) hours as needed for wheezing or shortness of breath. 07/30/23   Storm Frisk, MD  amiodarone (PACERONE) 200 MG tablet Take 1 tablet (200 mg total) by mouth daily. 03/09/24   Jonita Albee, PA-C  ARIPiprazole (ABILIFY) 15 MG tablet Take 0.5 tablets (7.5 mg total) by mouth daily. Patient not taking: Reported on 03/12/2024 03/07/24   Alwyn Ren, MD  aspirin EC 81 MG tablet Take 81 mg by mouth in the morning. Swallow whole.    [provider]  bictegravir-emtricitabine-tenofovir AF (BIKTARVY) 50-200-25 MG TABS tablet Take 1 tablet by mouth daily. Patient taking differently: Take 1 tablet by mouth in the morning. 08/15/23   Randall Hiss, MD  dronabinol (MARINOL) 5 MG capsule Take 1 capsule (5 mg total) by mouth 2 (two)  times daily before lunch and supper. 02/09/24   Jonita Albee, PA-C  eplerenone (INSPRA) 25 MG tablet Take 1 tablet (25 mg total) by mouth daily. 02/09/24   Jonita Albee, PA-C  finasteride (PROSCAR) 5 MG tablet TAKE 1 TABLET(5 MG) BY MOUTH DAILY Patient taking differently: Take 5 mg by mouth in the morning. 03/15/23   Storm Frisk, MD  fluticasone furoate-vilanterol (BREO ELLIPTA) 200-25 MCG/ACT AEPB Inhale 1 puff into the lungs daily.    [provider]  Melatonin 10 MG TABS Take 10 mg by mouth at bedtime.    [provider]  mexiletine (MEXITIL) 250 MG capsule Take 1 capsule (250 mg total) by mouth every 12 (twelve) hours. 02/08/24   Jonita Albee, PA-C  midodrine (PROAMATINE) 5 MG tablet Take 3 tablets (15 mg total) by mouth 3 (three) times daily. 03/07/24   Alwyn Ren, MD  mirtazapine (REMERON) 15 MG tablet Take 1 tablet (15 mg total) by mouth at bedtime. 03/07/24   Alwyn Ren, MD  ondansetron (ZOFRAN) 4 MG tablet Take 1 tablet (4 mg total) by mouth every 8 (eight) hours as needed for nausea or vomiting. 03/12/24   Camnitz, Andree Coss, MD  polyethylene glycol (MIRALAX / GLYCOLAX) 17 g packet Take 17 g by mouth daily. 02/09/24   Jonita Albee, PA-C  sertraline (ZOLOFT) 100 MG tablet Take 1 tablet (100  mg total) by mouth daily. Patient not taking: Reported on 03/12/2024 03/07/24   Alwyn Ren, MD  tamsulosin (FLOMAX) 0.4 MG CAPS capsule TAKE 2 CAPSULES(0.8 MG) BY MOUTH DAILY AFTER SUPPER Patient taking differently: Take 0.4 mg by mouth 2 (two) times daily. 07/26/23   Storm Frisk, MD  traZODone (DESYREL) 50 MG tablet Take 1 tablet (50 mg total) by mouth at bedtime as needed for sleep. Patient not taking: Reported on 03/12/2024 03/07/24   Alwyn Ren, MD  triamcinolone ointment (KENALOG) 0.5 % Apply 1 Application topically 2 (two) times daily. Patient taking differently: Apply 1 Application topically daily. 08/15/23    Storm Frisk, MD  valACYclovir (VALTREX) 500 MG tablet Take 1 tablet (500 mg total) by mouth daily. Patient taking differently: Take 500 mg by mouth in the morning. 11/15/22   Storm Frisk, MD      Allergies    Patient has no known allergies.    Review of Systems   Review of Systems  Physical Exam Updated Vital Signs BP 97/75   Pulse (!) 103   Temp (!) 97.2 F (36.2 C) (Oral)   Resp 13   Ht 5\' 8"  (1.727 m)   Wt 77.1 kg   SpO2 100%   BMI 25.85 kg/m  Physical Exam Vitals and nursing note reviewed.  Constitutional:      General: He is not in acute distress.    Appearance: He is well-developed.  HENT:     Head: Normocephalic and atraumatic.     Mouth/Throat:     Mouth: Mucous membranes are moist.  Eyes:     General: Vision grossly intact. Gaze aligned appropriately.     Extraocular Movements: Extraocular movements intact.     Conjunctiva/sclera: Conjunctivae normal.  Cardiovascular:     Rate and Rhythm: Normal rate and regular rhythm.     Pulses: Normal pulses.     Heart sounds: Normal heart sounds, S1 normal and S2 normal. No murmur heard.    No friction rub. No gallop.  Pulmonary:     Effort: Pulmonary effort is normal. No respiratory distress.     Breath sounds: Normal breath sounds.  Chest:     Chest wall: Tenderness present.    Abdominal:     Palpations: Abdomen is soft.     Tenderness: There is no abdominal tenderness. There is no guarding or rebound.     Hernia: No hernia is present.  Musculoskeletal:        General: No swelling.     Cervical back: Full passive range of motion without pain, normal range of motion and neck supple. No pain with movement, spinous process tenderness or muscular tenderness. Normal range of motion.     Right lower leg: No edema.     Left lower leg: No edema.  Skin:    General: Skin is warm and dry.     Capillary Refill: Capillary refill takes less than 2 seconds.     Findings: No ecchymosis, erythema, lesion or  wound.  Neurological:     Mental Status: He is alert and oriented to person, place, and time.     GCS: GCS eye subscore is 4. GCS verbal subscore is 5. GCS motor subscore is 6.     Cranial Nerves: Cranial nerves 2-12 are intact.     Sensory: Sensation is intact.     Motor: Motor function is intact. No weakness or abnormal muscle tone.     Coordination: Coordination is intact.  Psychiatric:        Mood and Affect: Mood normal.        Speech: Speech normal.        Behavior: Behavior normal.     ED Results / Procedures / Treatments   Labs (all labs ordered are listed, but only abnormal results are displayed) Labs Reviewed  CBC WITH DIFFERENTIAL/PLATELET - Abnormal; Notable for the following components:      Result Value   WBC 3.3 (*)    Hemoglobin 17.6 (*)    RDW 15.7 (*)    Platelets 141 (*)    All other components within normal limits  BASIC METABOLIC PANEL WITH GFR - Abnormal; Notable for the following components:   Sodium 134 (*)    CO2 18 (*)    Glucose, Bld 123 (*)    Creatinine, Ser 1.37 (*)    Calcium 8.0 (*)    GFR, Estimated 58 (*)    All other components within normal limits  HEPATIC FUNCTION PANEL - Abnormal; Notable for the following components:   Total Protein 5.3 (*)    Albumin 2.9 (*)    AST 46 (*)    ALT 53 (*)    Total Bilirubin 2.8 (*)    Bilirubin, Direct 0.9 (*)    Indirect Bilirubin 1.9 (*)    All other components within normal limits  BRAIN NATRIURETIC PEPTIDE - Abnormal; Notable for the following components:   B Natriuretic Peptide 1,875.5 (*)    All other components within normal limits  AMMONIA - Abnormal; Notable for the following components:   Ammonia 36 (*)    All other components within normal limits  URINALYSIS, ROUTINE W REFLEX MICROSCOPIC - Abnormal; Notable for the following components:   Color, Urine AMBER (*)    Protein, ur 30 (*)    All other components within normal limits  TROPONIN I (HIGH SENSITIVITY) - Abnormal; Notable for  the following components:   Troponin I (High Sensitivity) 76 (*)    All other components within normal limits  TROPONIN I (HIGH SENSITIVITY) - Abnormal; Notable for the following components:   Troponin I (High Sensitivity) 75 (*)    All other components within normal limits  RESP PANEL BY RT-PCR (RSV, FLU A&B, COVID)  RVPGX2  LIPASE, BLOOD  MAGNESIUM    EKG None  Radiology CT Angio Chest Pulmonary Embolism (PE) W or WO Contrast Result Date: 03/18/2024 CLINICAL DATA:  Nausea and shortness of breath, vomiting EXAM: CT ANGIOGRAPHY CHEST CT ABDOMEN AND PELVIS WITH CONTRAST TECHNIQUE: Multidetector CT imaging of the chest was performed using the standard protocol during bolus administration of intravenous contrast. Multiplanar CT image reconstructions and MIPs were obtained to evaluate the vascular anatomy. Multidetector CT imaging of the abdomen and pelvis was performed using the standard protocol during bolus administration of intravenous contrast. RADIATION DOSE REDUCTION: This exam was performed according to the departmental dose-optimization program which includes automated exposure control, adjustment of the mA and/or kV according to patient size and/or use of iterative reconstruction technique. CONTRAST:  75mL OMNIPAQUE IOHEXOL 350 MG/ML SOLN COMPARISON:  Chest radiograph 03/18/2024 and CTA chest 12/04/2023; CT abdomen pelvis 02/04/2024 FINDINGS: CTA CHEST FINDINGS Cardiovascular: Cardiomegaly. Small pericardial effusion, increased from 12/04/2023. Normal caliber aorta. Aortic atherosclerotic calcification. Negative for pulmonary embolism. Left chest wall pacemaker. Mediastinum/Nodes: Trachea and esophagus are unremarkable. No thoracic adenopathy. Lungs/Pleura: Mild centrilobular emphysema in the upper lobes. Clustered nodularity in the right lower lobe compatible with small airway infection/inflammation. No pleural effusion or pneumothorax.  Musculoskeletal: No acute fracture. Review of the MIP  images confirms the above findings. CT ABDOMEN and PELVIS FINDINGS Hepatobiliary: Hepatic steatosis. Subtle nodularity of the hepatic contour suggesting early cirrhosis. Sludge or vicarious excretion of contrast in the gallbladder. No biliary dilation. Pancreas: Fatty atrophy.  No acute abnormality. Spleen: Unremarkable. Adrenals/Urinary Tract: Normal adrenal glands. Bilateral cortical renal scarring. No urinary calculi or hydronephrosis. Unremarkable bladder. Stomach/Bowel: Colonic diverticulosis. Trace stranding about the proximal sigmoid colon suggesting mild diverticulitis. Normal appendix. Stomach is within normal limits. Vascular/Lymphatic: Aortic atherosclerosis. No enlarged abdominal or pelvic lymph nodes. Reproductive: Enlarged prostate indenting the floor of the bladder. Other: Small volume abdominopelvic ascites. No free intraperitoneal air. Musculoskeletal: No acute fracture. Review of the MIP images confirms the above findings. IMPRESSION: 1. Negative for pulmonary embolism. 2. Clustered nodularity in the right lower lobe compatible with small airway infection/inflammation. 3. Cardiomegaly. Small pericardial effusion, increased from 12/04/2023. 4. Hepatic steatosis with subtle nodularity of the hepatic contour suggesting early cirrhosis. 5. Small volume abdominopelvic ascites. 6. Mild proximal sigmoid diverticulitis. Electronically Signed   By: Minerva Fester M.D.   On: 03/18/2024 03:14   CT ABDOMEN PELVIS W CONTRAST Result Date: 03/18/2024 CLINICAL DATA:  Nausea and shortness of breath, vomiting EXAM: CT ANGIOGRAPHY CHEST CT ABDOMEN AND PELVIS WITH CONTRAST TECHNIQUE: Multidetector CT imaging of the chest was performed using the standard protocol during bolus administration of intravenous contrast. Multiplanar CT image reconstructions and MIPs were obtained to evaluate the vascular anatomy. Multidetector CT imaging of the abdomen and pelvis was performed using the standard protocol during bolus  administration of intravenous contrast. RADIATION DOSE REDUCTION: This exam was performed according to the departmental dose-optimization program which includes automated exposure control, adjustment of the mA and/or kV according to patient size and/or use of iterative reconstruction technique. CONTRAST:  75mL OMNIPAQUE IOHEXOL 350 MG/ML SOLN COMPARISON:  Chest radiograph 03/18/2024 and CTA chest 12/04/2023; CT abdomen pelvis 02/04/2024 FINDINGS: CTA CHEST FINDINGS Cardiovascular: Cardiomegaly. Small pericardial effusion, increased from 12/04/2023. Normal caliber aorta. Aortic atherosclerotic calcification. Negative for pulmonary embolism. Left chest wall pacemaker. Mediastinum/Nodes: Trachea and esophagus are unremarkable. No thoracic adenopathy. Lungs/Pleura: Mild centrilobular emphysema in the upper lobes. Clustered nodularity in the right lower lobe compatible with small airway infection/inflammation. No pleural effusion or pneumothorax. Musculoskeletal: No acute fracture. Review of the MIP images confirms the above findings. CT ABDOMEN and PELVIS FINDINGS Hepatobiliary: Hepatic steatosis. Subtle nodularity of the hepatic contour suggesting early cirrhosis. Sludge or vicarious excretion of contrast in the gallbladder. No biliary dilation. Pancreas: Fatty atrophy.  No acute abnormality. Spleen: Unremarkable. Adrenals/Urinary Tract: Normal adrenal glands. Bilateral cortical renal scarring. No urinary calculi or hydronephrosis. Unremarkable bladder. Stomach/Bowel: Colonic diverticulosis. Trace stranding about the proximal sigmoid colon suggesting mild diverticulitis. Normal appendix. Stomach is within normal limits. Vascular/Lymphatic: Aortic atherosclerosis. No enlarged abdominal or pelvic lymph nodes. Reproductive: Enlarged prostate indenting the floor of the bladder. Other: Small volume abdominopelvic ascites. No free intraperitoneal air. Musculoskeletal: No acute fracture. Review of the MIP images confirms the  above findings. IMPRESSION: 1. Negative for pulmonary embolism. 2. Clustered nodularity in the right lower lobe compatible with small airway infection/inflammation. 3. Cardiomegaly. Small pericardial effusion, increased from 12/04/2023. 4. Hepatic steatosis with subtle nodularity of the hepatic contour suggesting early cirrhosis. 5. Small volume abdominopelvic ascites. 6. Mild proximal sigmoid diverticulitis. Electronically Signed   By: Minerva Fester M.D.   On: 03/18/2024 03:14   DG Chest Port 1 View Result Date: 03/18/2024 CLINICAL DATA:  Chest pain EXAM: PORTABLE  CHEST 1 VIEW COMPARISON:  02/22/2024 FINDINGS: Cardiomegaly. Left AICD remains in place, unchanged. No confluent opacities, effusions or edema. No acute bony abnormality. IMPRESSION: No active disease. Electronically Signed   By: Charlett Nose M.D.   On: 03/18/2024 00:13    Procedures Procedures    Medications Ordered in ED Medications  iohexol (OMNIPAQUE) 350 MG/ML injection 75 mL (75 mLs Intravenous Contrast Given 03/18/24 0258)    ED Course/ Medical Decision Making/ A&P                                 Medical Decision Making Amount and/or Complexity of Data Reviewed Labs: ordered. Radiology: ordered.  Risk Prescription drug management.   Patient presents to the emergency department for evaluation of chest pain and abdominal discomfort.  Patient has a history of nonischemic cardiomyopathy.  It appears that he has recently entered hospice care and had his ICD turned off.  Patient reportedly has in the past been noncompliant with his torsemide secondary to concerns over having to urinate frequently.  He does look volume overloaded today.  Patient's troponins are elevated, 76, followed by 75.  This is not likely NSTEMI, more likely troponin leak secondary to volume overload from nonischemic cardiomyopathy.  Patient underwent CT PE study.  No evidence of PE noted.  Possible pneumonia.  CT abdomen and pelvis performed because of  his abdominal discomfort and mild elevations of LFTs.  He does have evidence of hepatic steatosis and early ascites.  This likely explains lab work.  No acute findings.        Final Clinical Impression(s) / ED Diagnoses Final diagnoses:  Chest pain, unspecified type  Acute on chronic congestive heart failure, unspecified heart failure type [facility])    Rx / DC Orders ED Discharge Orders     None         Gilda Crease, MD 03/18/24 [phone]

## 2024-03-18 ENCOUNTER — Encounter: Payer: Self-pay | Admitting: Cardiology

## 2024-03-18 ENCOUNTER — Encounter (HOSPITAL_COMMUNITY): Payer: 59

## 2024-03-18 ENCOUNTER — Other Ambulatory Visit: Payer: Self-pay

## 2024-03-18 ENCOUNTER — Emergency Department (HOSPITAL_COMMUNITY)

## 2024-03-18 ENCOUNTER — Inpatient Hospital Stay (HOSPITAL_COMMUNITY)

## 2024-03-18 DIAGNOSIS — I5084 End stage heart failure: Secondary | ICD-10-CM

## 2024-03-18 DIAGNOSIS — F319 Bipolar disorder, unspecified: Secondary | ICD-10-CM | POA: Diagnosis present

## 2024-03-18 DIAGNOSIS — N1831 Chronic kidney disease, stage 3a: Secondary | ICD-10-CM | POA: Diagnosis present

## 2024-03-18 DIAGNOSIS — R079 Chest pain, unspecified: Secondary | ICD-10-CM

## 2024-03-18 DIAGNOSIS — D751 Secondary polycythemia: Secondary | ICD-10-CM | POA: Diagnosis present

## 2024-03-18 DIAGNOSIS — K838 Other specified diseases of biliary tract: Secondary | ICD-10-CM | POA: Diagnosis not present

## 2024-03-18 DIAGNOSIS — J4489 Other specified chronic obstructive pulmonary disease: Secondary | ICD-10-CM | POA: Diagnosis present

## 2024-03-18 DIAGNOSIS — I428 Other cardiomyopathies: Secondary | ICD-10-CM | POA: Diagnosis present

## 2024-03-18 DIAGNOSIS — I9589 Other hypotension: Secondary | ICD-10-CM | POA: Diagnosis not present

## 2024-03-18 DIAGNOSIS — D6959 Other secondary thrombocytopenia: Secondary | ICD-10-CM | POA: Diagnosis present

## 2024-03-18 DIAGNOSIS — E785 Hyperlipidemia, unspecified: Secondary | ICD-10-CM | POA: Diagnosis present

## 2024-03-18 DIAGNOSIS — I5023 Acute on chronic systolic (congestive) heart failure: Secondary | ICD-10-CM | POA: Diagnosis present

## 2024-03-18 DIAGNOSIS — I5082 Biventricular heart failure: Secondary | ICD-10-CM | POA: Diagnosis present

## 2024-03-18 DIAGNOSIS — Z95810 Presence of automatic (implantable) cardiac defibrillator: Secondary | ICD-10-CM | POA: Diagnosis not present

## 2024-03-18 DIAGNOSIS — K7460 Unspecified cirrhosis of liver: Secondary | ICD-10-CM

## 2024-03-18 DIAGNOSIS — I509 Heart failure, unspecified: Secondary | ICD-10-CM | POA: Diagnosis present

## 2024-03-18 DIAGNOSIS — I3139 Other pericardial effusion (noninflammatory): Secondary | ICD-10-CM | POA: Diagnosis present

## 2024-03-18 DIAGNOSIS — Z515 Encounter for palliative care: Secondary | ICD-10-CM

## 2024-03-18 DIAGNOSIS — I5043 Acute on chronic combined systolic (congestive) and diastolic (congestive) heart failure: Secondary | ICD-10-CM | POA: Diagnosis not present

## 2024-03-18 DIAGNOSIS — R3911 Hesitancy of micturition: Secondary | ICD-10-CM | POA: Diagnosis not present

## 2024-03-18 DIAGNOSIS — Z21 Asymptomatic human immunodeficiency virus [HIV] infection status: Secondary | ICD-10-CM | POA: Diagnosis present

## 2024-03-18 DIAGNOSIS — I472 Ventricular tachycardia, unspecified: Secondary | ICD-10-CM

## 2024-03-18 DIAGNOSIS — I517 Cardiomegaly: Secondary | ICD-10-CM | POA: Diagnosis not present

## 2024-03-18 DIAGNOSIS — J432 Centrilobular emphysema: Secondary | ICD-10-CM | POA: Diagnosis not present

## 2024-03-18 DIAGNOSIS — D72819 Decreased white blood cell count, unspecified: Secondary | ICD-10-CM | POA: Diagnosis present

## 2024-03-18 DIAGNOSIS — J449 Chronic obstructive pulmonary disease, unspecified: Secondary | ICD-10-CM | POA: Diagnosis not present

## 2024-03-18 DIAGNOSIS — R188 Other ascites: Secondary | ICD-10-CM

## 2024-03-18 DIAGNOSIS — I130 Hypertensive heart and chronic kidney disease with heart failure and stage 1 through stage 4 chronic kidney disease, or unspecified chronic kidney disease: Secondary | ICD-10-CM | POA: Diagnosis present

## 2024-03-18 DIAGNOSIS — K5792 Diverticulitis of intestine, part unspecified, without perforation or abscess without bleeding: Secondary | ICD-10-CM

## 2024-03-18 DIAGNOSIS — M7918 Myalgia, other site: Secondary | ICD-10-CM | POA: Diagnosis not present

## 2024-03-18 DIAGNOSIS — I10 Essential (primary) hypertension: Secondary | ICD-10-CM | POA: Diagnosis not present

## 2024-03-18 DIAGNOSIS — E871 Hypo-osmolality and hyponatremia: Secondary | ICD-10-CM | POA: Diagnosis present

## 2024-03-18 DIAGNOSIS — K7689 Other specified diseases of liver: Secondary | ICD-10-CM | POA: Diagnosis not present

## 2024-03-18 DIAGNOSIS — K828 Other specified diseases of gallbladder: Secondary | ICD-10-CM | POA: Diagnosis not present

## 2024-03-18 DIAGNOSIS — Z1152 Encounter for screening for COVID-19: Secondary | ICD-10-CM | POA: Diagnosis not present

## 2024-03-18 DIAGNOSIS — I21A1 Myocardial infarction type 2: Secondary | ICD-10-CM | POA: Diagnosis present

## 2024-03-18 DIAGNOSIS — I4891 Unspecified atrial fibrillation: Secondary | ICD-10-CM | POA: Diagnosis present

## 2024-03-18 DIAGNOSIS — R7401 Elevation of levels of liver transaminase levels: Secondary | ICD-10-CM | POA: Diagnosis not present

## 2024-03-18 DIAGNOSIS — Z66 Do not resuscitate: Secondary | ICD-10-CM | POA: Diagnosis present

## 2024-03-18 DIAGNOSIS — K5732 Diverticulitis of large intestine without perforation or abscess without bleeding: Secondary | ICD-10-CM | POA: Diagnosis present

## 2024-03-18 LAB — CBC WITH DIFFERENTIAL/PLATELET
Abs Immature Granulocytes: 0.01 10*3/uL (ref 0.00–0.07)
Basophils Absolute: 0 10*3/uL (ref 0.0–0.1)
Basophils Relative: 1 %
Eosinophils Absolute: 0 10*3/uL (ref 0.0–0.5)
Eosinophils Relative: 0 %
HCT: 49.6 % (ref 39.0–52.0)
Hemoglobin: 17.6 g/dL — ABNORMAL HIGH (ref 13.0–17.0)
Immature Granulocytes: 0 %
Lymphocytes Relative: 34 %
Lymphs Abs: 1.1 10*3/uL (ref 0.7–4.0)
MCH: 33.8 pg (ref 26.0–34.0)
MCHC: 35.5 g/dL (ref 30.0–36.0)
MCV: 95.4 fL (ref 80.0–100.0)
Monocytes Absolute: 0.5 10*3/uL (ref 0.1–1.0)
Monocytes Relative: 14 %
Neutro Abs: 1.7 10*3/uL (ref 1.7–7.7)
Neutrophils Relative %: 51 %
Platelets: 141 10*3/uL — ABNORMAL LOW (ref 150–400)
RBC: 5.2 MIL/uL (ref 4.22–5.81)
RDW: 15.7 % — ABNORMAL HIGH (ref 11.5–15.5)
WBC: 3.3 10*3/uL — ABNORMAL LOW (ref 4.0–10.5)
nRBC: 0 % (ref 0.0–0.2)

## 2024-03-18 LAB — LIPASE, BLOOD: Lipase: 20 U/L (ref 11–51)

## 2024-03-18 LAB — HEPATIC FUNCTION PANEL
ALT: 53 U/L — ABNORMAL HIGH (ref 0–44)
AST: 46 U/L — ABNORMAL HIGH (ref 15–41)
Albumin: 2.9 g/dL — ABNORMAL LOW (ref 3.5–5.0)
Alkaline Phosphatase: 99 U/L (ref 38–126)
Bilirubin, Direct: 0.9 mg/dL — ABNORMAL HIGH (ref 0.0–0.2)
Indirect Bilirubin: 1.9 mg/dL — ABNORMAL HIGH (ref 0.3–0.9)
Total Bilirubin: 2.8 mg/dL — ABNORMAL HIGH (ref 0.0–1.2)
Total Protein: 5.3 g/dL — ABNORMAL LOW (ref 6.5–8.1)

## 2024-03-18 LAB — URINALYSIS, ROUTINE W REFLEX MICROSCOPIC
Bacteria, UA: NONE SEEN
Bilirubin Urine: NEGATIVE
Glucose, UA: NEGATIVE mg/dL
Hgb urine dipstick: NEGATIVE
Ketones, ur: NEGATIVE mg/dL
Leukocytes,Ua: NEGATIVE
Nitrite: NEGATIVE
Protein, ur: 30 mg/dL — AB
Specific Gravity, Urine: 1.013 (ref 1.005–1.030)
pH: 5 (ref 5.0–8.0)

## 2024-03-18 LAB — MAGNESIUM: Magnesium: 2.1 mg/dL (ref 1.7–2.4)

## 2024-03-18 LAB — BASIC METABOLIC PANEL WITH GFR
Anion gap: 10 (ref 5–15)
BUN: 18 mg/dL (ref 8–23)
CO2: 18 mmol/L — ABNORMAL LOW (ref 22–32)
Calcium: 8 mg/dL — ABNORMAL LOW (ref 8.9–10.3)
Chloride: 106 mmol/L (ref 98–111)
Creatinine, Ser: 1.37 mg/dL — ABNORMAL HIGH (ref 0.61–1.24)
GFR, Estimated: 58 mL/min — ABNORMAL LOW (ref >60)
Glucose, Bld: 123 mg/dL — ABNORMAL HIGH (ref 70–99)
Potassium: 4 mmol/L (ref 3.5–5.1)
Sodium: 134 mmol/L — ABNORMAL LOW (ref 135–145)

## 2024-03-18 LAB — AMMONIA: Ammonia: 36 umol/L — ABNORMAL HIGH (ref 9–35)

## 2024-03-18 LAB — RESP PANEL BY RT-PCR (RSV, FLU A&B, COVID)  RVPGX2
Influenza A by PCR: NEGATIVE
Influenza B by PCR: NEGATIVE
Resp Syncytial Virus by PCR: NEGATIVE
SARS Coronavirus 2 by RT PCR: NEGATIVE

## 2024-03-18 LAB — TROPONIN I (HIGH SENSITIVITY)
Troponin I (High Sensitivity): 75 ng/L — ABNORMAL HIGH (ref <18)
Troponin I (High Sensitivity): 76 ng/L — ABNORMAL HIGH (ref <18)

## 2024-03-18 LAB — HEPATITIS PANEL, ACUTE
HCV Ab: NONREACTIVE
Hep A IgM: NONREACTIVE
Hep B C IgM: NONREACTIVE
Hepatitis B Surface Ag: NONREACTIVE

## 2024-03-18 LAB — PROTIME-INR
INR: 1.2 (ref 0.8–1.2)
Prothrombin Time: 15.5 s — ABNORMAL HIGH (ref 11.4–15.2)

## 2024-03-18 LAB — LACTIC ACID, PLASMA: Lactic Acid, Venous: 1.2 mmol/L (ref 0.5–1.9)

## 2024-03-18 LAB — BRAIN NATRIURETIC PEPTIDE: B Natriuretic Peptide: 1875.5 pg/mL — ABNORMAL HIGH (ref 0.0–100.0)

## 2024-03-18 MED ORDER — FUROSEMIDE 10 MG/ML IJ SOLN
20.0000 mg | Freq: Once | INTRAMUSCULAR | Status: AC
  Administered 2024-03-18: 20 mg via INTRAVENOUS
  Filled 2024-03-18: qty 2

## 2024-03-18 MED ORDER — FUROSEMIDE 10 MG/ML IJ SOLN: 20.0000 mg | Freq: Once | INTRAMUSCULAR | Status: DC

## 2024-03-18 MED ORDER — FUROSEMIDE 10 MG/ML IJ SOLN
20.0000 mg | Freq: Once | INTRAMUSCULAR | Status: DC
  Filled 2024-03-18: qty 2

## 2024-03-18 MED ORDER — IPRATROPIUM BROMIDE 0.02 % IN SOLN: 0.5000 mg | Freq: Four times a day (QID) | RESPIRATORY_TRACT | Status: DC | PRN

## 2024-03-18 MED ORDER — AMIODARONE HCL 200 MG PO TABS
200.0000 mg | ORAL_TABLET | Freq: Every day | ORAL | Status: DC
  Administered 2024-03-18 – 2024-03-23 (×6): 200 mg via ORAL
  Filled 2024-03-18 (×6): qty 1

## 2024-03-18 MED ORDER — ASPIRIN 81 MG PO TBEC
81.0000 mg | DELAYED_RELEASE_TABLET | Freq: Every day | ORAL | Status: DC
  Administered 2024-03-18: 81 mg via ORAL
  Filled 2024-03-18: qty 1

## 2024-03-18 MED ORDER — BICTEGRAVIR-EMTRICITAB-TENOFOV 50-200-25 MG PO TABS
1.0000 | ORAL_TABLET | Freq: Every day | ORAL | Status: DC
  Administered 2024-03-18 – 2024-03-23 (×6): 1 via ORAL
  Filled 2024-03-18 (×7): qty 1

## 2024-03-18 MED ORDER — MEXILETINE HCL 250 MG PO CAPS
250.0000 mg | ORAL_CAPSULE | Freq: Two times a day (BID) | ORAL | Status: DC
  Filled 2024-03-18: qty 1

## 2024-03-18 MED ORDER — SODIUM CHLORIDE 0.9% FLUSH: 3.0000 mL | INTRAVENOUS | Status: DC | PRN

## 2024-03-18 MED ORDER — ALBUTEROL SULFATE (2.5 MG/3ML) 0.083% IN NEBU: 3.0000 mL | INHALATION_SOLUTION | Freq: Four times a day (QID) | RESPIRATORY_TRACT | Status: DC | PRN

## 2024-03-18 MED ORDER — ENOXAPARIN SODIUM 40 MG/0.4ML IJ SOSY
40.0000 mg | PREFILLED_SYRINGE | INTRAMUSCULAR | Status: DC
  Administered 2024-03-18 – 2024-03-22 (×5): 40 mg via SUBCUTANEOUS
  Filled 2024-03-18 (×5): qty 0.4

## 2024-03-18 MED ORDER — MIDODRINE HCL 5 MG PO TABS
15.0000 mg | ORAL_TABLET | Freq: Three times a day (TID) | ORAL | Status: DC
  Administered 2024-03-18 – 2024-03-23 (×16): 15 mg via ORAL
  Filled 2024-03-18 (×16): qty 3

## 2024-03-18 MED ORDER — MOMETASONE FURO-FORMOTEROL FUM 100-5 MCG/ACT IN AERO
2.0000 | INHALATION_SPRAY | Freq: Two times a day (BID) | RESPIRATORY_TRACT | Status: DC
  Administered 2024-03-18 – 2024-03-23 (×9): 2 via RESPIRATORY_TRACT
  Filled 2024-03-18 (×3): qty 8.8

## 2024-03-18 MED ORDER — ONDANSETRON HCL 4 MG/2ML IJ SOLN
4.0000 mg | Freq: Four times a day (QID) | INTRAMUSCULAR | Status: DC | PRN
  Administered 2024-03-19 – 2024-03-21 (×5): 4 mg via INTRAVENOUS
  Filled 2024-03-18 (×5): qty 2

## 2024-03-18 MED ORDER — LACTATED RINGERS IV BOLUS: 250.0000 mL | Freq: Once | INTRAVENOUS | Status: DC

## 2024-03-18 MED ORDER — MEXILETINE HCL 250 MG PO CAPS
250.0000 mg | ORAL_CAPSULE | Freq: Two times a day (BID) | ORAL | Status: DC
  Administered 2024-03-18 – 2024-03-23 (×11): 250 mg via ORAL
  Filled 2024-03-18 (×11): qty 1

## 2024-03-18 MED ORDER — IOHEXOL 350 MG/ML SOLN
75.0000 mL | Freq: Once | INTRAVENOUS | Status: AC | PRN
  Administered 2024-03-18: 75 mL via INTRAVENOUS

## 2024-03-18 MED ORDER — LEVALBUTEROL HCL 0.63 MG/3ML IN NEBU: 0.6300 mg | INHALATION_SOLUTION | Freq: Four times a day (QID) | RESPIRATORY_TRACT | Status: DC | PRN

## 2024-03-18 MED ORDER — ACETAMINOPHEN 325 MG PO TABS
650.0000 mg | ORAL_TABLET | ORAL | Status: DC | PRN
  Administered 2024-03-22: 650 mg via ORAL
  Filled 2024-03-18 (×2): qty 2

## 2024-03-18 MED ORDER — POLYETHYLENE GLYCOL 3350 17 G PO PACK: 17.0000 g | PACK | Freq: Every day | ORAL | Status: DC | PRN

## 2024-03-18 MED ORDER — DRONABINOL 2.5 MG PO CAPS
5.0000 mg | ORAL_CAPSULE | Freq: Two times a day (BID) | ORAL | Status: DC
  Administered 2024-03-18 – 2024-03-22 (×10): 5 mg via ORAL
  Filled 2024-03-18 (×4): qty 2
  Filled 2024-03-18: qty 1
  Filled 2024-03-18 (×6): qty 2

## 2024-03-18 MED ORDER — FUROSEMIDE 10 MG/ML IJ SOLN
40.0000 mg | Freq: Once | INTRAMUSCULAR | Status: AC
  Administered 2024-03-18: 40 mg via INTRAVENOUS
  Filled 2024-03-18: qty 4

## 2024-03-18 MED ORDER — POLYETHYLENE GLYCOL 3350 17 G PO PACK: 17.0000 g | PACK | Freq: Every day | ORAL | Status: DC

## 2024-03-18 MED ORDER — VALACYCLOVIR HCL 500 MG PO TABS
500.0000 mg | ORAL_TABLET | Freq: Every morning | ORAL | Status: DC
  Administered 2024-03-18 – 2024-03-23 (×6): 500 mg via ORAL
  Filled 2024-03-18 (×6): qty 1

## 2024-03-18 MED ORDER — SODIUM CHLORIDE 0.9% FLUSH
3.0000 mL | Freq: Two times a day (BID) | INTRAVENOUS | Status: DC
  Administered 2024-03-18 – 2024-03-23 (×11): 3 mL via INTRAVENOUS

## 2024-03-18 MED ORDER — TRAZODONE HCL 50 MG PO TABS
50.0000 mg | ORAL_TABLET | Freq: Every evening | ORAL | Status: DC | PRN
  Administered 2024-03-18 – 2024-03-22 (×5): 50 mg via ORAL
  Filled 2024-03-18 (×5): qty 1

## 2024-03-18 MED ORDER — SODIUM CHLORIDE 0.9 % IV SOLN: 250.0000 mL | INTRAVENOUS | Status: AC | PRN

## 2024-03-18 MED ORDER — MIDODRINE HCL 5 MG PO TABS: 15.0000 mg | ORAL_TABLET | Freq: Three times a day (TID) | ORAL | Status: DC

## 2024-03-18 NOTE — Plan of Care (Signed)
   Problem: Education: Goal: Knowledge of General Education information will improve Description Including pain rating scale, medication(s)/side effects and non-pharmacologic comfort measures Outcome: Progressing

## 2024-03-18 NOTE — Progress Notes (Signed)
 Heart Failure Navigator Progress Note  Assessed for Heart & Vascular TOC clinic readiness.  Patient does not meet criteria due to Advanced Heart Failure Team patient of Dr. Gala Romney, active with Hospice outpatient. .   Navigator will sign off at this time.   Rhae Hammock, BSN, Scientist, clinical (histocompatibility and immunogenetics) Only

## 2024-03-18 NOTE — H&P (Addendum)
 History and Physical    Patient: Ryan Long GNF:[id number] DOB: [birth year] DOA: 03/17/2024 DOS: the patient was seen and examined on 03/18/2024 PCP: Jackie Plum, MD  Patient coming from: Home  Chief Complaint: Shortness of breath  HPI: Ryan Long is a 64 y.o. male with medical history significant of chronic systolic heart failure, nonischemic cardiomyopathy, ventricular tachycardia (previously had ICD but turned off after transition to hospice), CKD stage IIIa, COPD, depression and anxiety, HIV, hypertension, hyperlipidemia, bipolar 1 disorder, polycythemia presenting to the ED with  Patient reports that he has been feeling progressively more short of breath with exertion and with rest.  He does note orthopnea, sleeping on about 2 pillows at home.  He also has noted associated increased leg swelling.  He denies any fevers, chills, chest pain, palpitations, abdominal pain.  He does note decreased urination and has experienced some nausea with 2 episodes of NBNB emesis.  Along with this, has had decreased oral intake.  States that food tastes bad to him due to losing his smell in the past, unclear etiology.  He has been drinking Ensure at home and is tolerating this.  Otherwise only eating simple foods like applesauce and oatmeal.  Of note, patient does follow [facility] in the outpatient setting.  He states that he does not have their contact number after recently getting a new phone.  ED course: Vital signs notable for low blood pressures.  CBC with leukopenia (chronic), polycythemia at his baseline, mild thrombocytopenia.  BMP with mild hyponatremia, kidney function at his baseline.  Hepatic function panel with low albumin and elevated liver enzymes.  Lipase normal.  BNP 1875.  Troponin 76 and 75 on repeat.  Urinalysis negative for infection.  Chest x-ray unremarkable.  CTA chest negative for pulmonary embolism, does show clustered nodularity in the right lower lobe  compatible with small airway infection/inflammation.  CT abdomen pelvis showing hepatic steatosis with signs suggestive of early cirrhosis and small volume abdominopelvic ascites.  CT abdomen pelvis also showing mild proximal sigmoid diverticulitis.  Patient given dose of IV Lasix 20 mg in the ED.  [facility] hospitalist asked to evaluate patient for admission.  Review of Systems: As mentioned in the history of present illness. All other systems reviewed and are negative. Past Medical History:  Diagnosis Date   Active smoker    AICD (automatic cardioverter/defibrillator) present    Alcohol abuse    Allergy July 2023   Anxiety    AR (allergic rhinitis)    Bipolar 1 disorder (HCC)    Cervical lymphadenitis 04/20/2021   CHF (congestive heart failure) (HCC)    Chronic bronchitis (HCC)    Chronic systolic heart failure (HCC)    Controlled substance agreement signed 10/22/2018   COPD (chronic obstructive pulmonary disease) (HCC)    COPD (chronic obstructive pulmonary disease) (HCC) 10/04/2015   Cough 12/31/2018   Crack cocaine use    Depression    Genital herpes    HIV (human immunodeficiency virus infection) (HCC) dx'd 08/1993   HLD (hyperlipidemia)    Hypertension    NICM (nonischemic cardiomyopathy) (HCC)    Echocardiogram 06/28/11: EF 30-35%, mild MR, mild LAE;  No CAD by coronary CT angiogram 3/12 at [facility]   NSVT (nonsustained ventricular tachycardia) (HCC)    PTSD (post-traumatic stress disorder)    Past Surgical History:  Procedure Laterality Date   CARDIAC DEFIBRILLATOR PLACEMENT  01/08/2018   ICD IMPLANT N/A 01/08/2018   Procedure: ICD IMPLANT;  Surgeon: Elberta Fortis, Will  Daphine Deutscher, MD;  Location: [facility] INVASIVE CV LAB;  Service: Cardiovascular;  Laterality: N/A;   RIGHT/LEFT HEART CATH AND CORONARY ANGIOGRAPHY N/A 01/03/2024   Procedure: RIGHT/LEFT HEART CATH AND CORONARY ANGIOGRAPHY;  Surgeon: Dolores Patty, MD;  Location: [facility] INVASIVE CV LAB;  Service:  Cardiovascular;  Laterality: N/A;   Social History:  reports that he has been smoking cigarettes. He has a 31.5 pack-year smoking history. He has never used smokeless tobacco. He reports that he does not currently use alcohol after a past usage of about 6.0 standard drinks of alcohol per week. He reports that he does not currently use drugs after having used the following drugs: "Crack" cocaine.  No Known Allergies  Family History  Problem Relation Age of Onset   Glaucoma Mother    Mental illness Mother    Vision loss Mother    Hypertension Father    CAD Father    Mental illness Father    Alcohol abuse Father    Drug abuse Father    Heart attack Father 28   Early death Father    Heart disease Father    Alcohol abuse Brother    Drug abuse Brother    Drug abuse Brother     Prior to Admission medications   Medication Sig Start Date End Date Taking? Authorizing Provider  acetaminophen (TYLENOL) 325 MG tablet Take 2 tablets (650 mg total) by mouth every 6 (six) hours as needed for mild pain (pain score 1-3) (or Fever >/= 101). 03/07/24  Yes Alwyn Ren, MD  albuterol (VENTOLIN HFA) 108 (90 Base) MCG/ACT inhaler Inhale 2 puffs into the lungs every 6 (six) hours as needed for wheezing or shortness of breath. 07/30/23  Yes Storm Frisk, MD  amiodarone (PACERONE) 200 MG tablet Take 1 tablet (200 mg total) by mouth daily. 03/09/24  Yes Jonita Albee, PA-C  aspirin EC 81 MG tablet Take 81 mg by mouth in the morning. Swallow whole.   Yes [provider]  bictegravir-emtricitabine-tenofovir AF (BIKTARVY) 50-200-25 MG TABS tablet Take 1 tablet by mouth daily. Patient taking differently: Take 1 tablet by mouth in the morning. 08/15/23  Yes Daiva Eves, Lisette Grinder, MD  finasteride (PROSCAR) 5 MG tablet TAKE 1 TABLET(5 MG) BY MOUTH DAILY Patient taking differently: Take 5 mg by mouth in the morning. 03/15/23  Yes Storm Frisk, MD  furosemide (LASIX) 20 MG tablet Take 20 mg  by mouth daily as needed for fluid. 03/13/24  Yes [provider]  Melatonin 10 MG TABS Take 10 mg by mouth at bedtime.   Yes [provider]  mexiletine (MEXITIL) 250 MG capsule Take 1 capsule (250 mg total) by mouth every 12 (twelve) hours. 02/08/24  Yes Jonita Albee, PA-C  midodrine (PROAMATINE) 5 MG tablet Take 3 tablets (15 mg total) by mouth 3 (three) times daily. 03/07/24  Yes Alwyn Ren, MD  ondansetron (ZOFRAN) 4 MG tablet Take 1 tablet (4 mg total) by mouth every 8 (eight) hours as needed for nausea or vomiting. 03/12/24  Yes Camnitz, Will Daphine Deutscher, MD  polyethylene glycol (MIRALAX / GLYCOLAX) 17 g packet Take 17 g by mouth daily. 02/09/24  Yes Jonita Albee, PA-C  SYMBICORT 80-4.5 MCG/ACT inhaler Inhale 2 puffs into the lungs in the morning and at bedtime. 03/06/24  Yes [provider]  tamsulosin (FLOMAX) 0.4 MG CAPS capsule TAKE 2 CAPSULES(0.8 MG) BY MOUTH DAILY AFTER SUPPER Patient taking differently: Take 0.4 mg by mouth 2 (two)  times daily. 07/26/23  Yes Storm Frisk, MD  triamcinolone ointment (KENALOG) 0.5 % Apply 1 Application topically 2 (two) times daily. 08/15/23  Yes Storm Frisk, MD  valACYclovir (VALTREX) 500 MG tablet Take 1 tablet (500 mg total) by mouth daily. Patient taking differently: Take 500 mg by mouth in the morning. 11/15/22  Yes Storm Frisk, MD  dronabinol (MARINOL) 5 MG capsule Take 1 capsule (5 mg total) by mouth 2 (two) times daily before lunch and supper. 02/09/24   Jonita Albee, PA-C    Physical Exam: Vitals:   03/18/24 0700 03/18/24 0715 03/18/24 0730 03/18/24 0741  BP: 93/68 (!) 78/60 (!) 76/62 (!) 82/66  Pulse: 80 89 85   Resp: 12 (!) 39 20 (!) 22  Temp:      TempSrc:      SpO2: 100% 94% 94%   Weight:      Height:       Physical Exam Constitutional:      Appearance: Normal appearance.  HENT:     Head: Normocephalic and atraumatic.     Mouth/Throat:     Mouth: Mucous membranes are  moist.     Pharynx: Oropharynx is clear. No oropharyngeal exudate.  Eyes:     General: No scleral icterus.    Extraocular Movements: Extraocular movements intact.     Pupils: Pupils are equal, round, and reactive to light.  Cardiovascular:     Rate and Rhythm: Normal rate and regular rhythm.     Heart sounds: Normal heart sounds. No murmur heard.    No friction rub. No gallop.  Pulmonary:     Effort: Pulmonary effort is normal. No respiratory distress.     Breath sounds: Normal breath sounds. No wheezing, rhonchi or rales.  Abdominal:     General: Bowel sounds are normal. There is no distension.     Palpations: Abdomen is soft.     Tenderness: There is no abdominal tenderness. There is no guarding or rebound.  Musculoskeletal:        General: Normal range of motion.     Cervical back: Normal range of motion.     Comments: 0-1+ pitting edema in bilateral LE.  Skin:    General: Skin is warm and dry.  Neurological:     General: No focal deficit present.     Mental Status: He is alert and oriented to person, place, and time.  Psychiatric:        Mood and Affect: Mood normal.        Behavior: Behavior normal.     Data Reviewed:  There are no new results to review at this time.    Latest Ref Rng & Units 03/17/2024   11:58 PM 03/05/2024    5:23 AM 02/23/2024    6:12 AM  CBC  WBC 4.0 - 10.5 K/uL 3.3  2.7  2.6   Hemoglobin 13.0 - 17.0 g/dL 16.1  09.6  04.5   Hematocrit 39.0 - 52.0 % 49.6  52.2  50.3   Platelets 150 - 400 K/uL 141  167  162       Latest Ref Rng & Units 03/17/2024   11:58 PM 03/07/2024    4:49 AM 03/05/2024    5:23 AM  CMP  Glucose 70 - 99 mg/dL 409  85  92   BUN 8 - 23 mg/dL 18  28  28    Creatinine 0.61 - 1.24 mg/dL 8.11  9.14  7.82   Sodium 135 -  145 mmol/L 134  138  137   Potassium 3.5 - 5.1 mmol/L 4.0  3.7  4.3   Chloride 98 - 111 mmol/L 106  108  102   CO2 22 - 32 mmol/L 18  20  22    Calcium 8.9 - 10.3 mg/dL 8.0  8.2  8.6   Total Protein 6.5 - 8.1 g/dL  5.3     Total Bilirubin 0.0 - 1.2 mg/dL 2.8     Alkaline Phos 38 - 126 U/L 99     AST 15 - 41 U/L 46     ALT 0 - 44 U/L 53      BNP    Component Value Date/Time   BNP 1,875.5 (H) 03/17/2024 2358      Assessment and Plan: No notes have been filed under this hospital service. Service: Hospitalist  Acute on chronic HFrEF/end-stage nonischemic cardiomyopathy Last echo in October 2024 showing LVEF 20 to 25%.  Patient was admitted in February 2025 with worsening heart failure symptoms and VT storm.  Palliative care was consulted and patient ultimately decided to pursue hospice and thus his ICD was inactivated.  He presents today with worsening shortness of breath and lower extremity edema.  BNP elevated at 1875.  Patient given 1 dose of IV Lasix 20 mg in the ED with some improvement.  Blood pressures are running low and have restarted his home midodrine for blood pressure support.  Will hold his home eplerenone and Jardiance given low blood pressures.  Cardiology consulted to help with diuresis. -cardiology consulted, appreciate assistance -s/p IV lasix 20mg  with 700cc UOP, will increase to IV lasix 40mg  today -holding home eplerenone and jardiance given low BP -strict I/O's, daily weights -supplemental O2 prn to maintain O2 sats >92%  ADDENDUM: Patient given dose of IV lasix 40mg  prior to cardiology evaluation. Will provide 250cc bolus to help avoid over-diuresis. Will monitor overnight and check BMP tomorrow morning. Per cardiology, if remains stable overnight, okay to discharge tomorrow back to home hospice with [facility]. Discharge with PO lasix 20mg  daily and with 20mg  potassium supplementation. Per cardiology, consider stopping eplerenone and jardiance at discharge to avoid precipitating worsening hypotension. Unfortunately, poor prognosis overall.   Chronic hypotension Likely related to low-output heart failure. Patient is on midodrine 15mg  TID at home. BP ranging in the 70s-90s/60s.  Fortunately, MAPs sustaining >65. -resume home midodrine 15mg  TID -trend BP curve, goal MAP >65  Mild proximal sigmoid uncomplicated diverticulitis Noted on CT imaging.  Likely etiology of patient's nausea and vomiting.  He does not have any abdominal discomfort on my exam.  Given he is afebrile and without leukocytosis, will hold off on initiation of antibiotics at this time.  Will provide supportive care.  If clinical status changes, can consider empiric antibiotics. -Supportive care -Zofran every 6 hours as needed for nausea -Holding off on antibiotics at this time  Hepatic steatosis Early cirrhosis Thrombocytopenia Patient with new liver enzyme abnormalities.  He was already noted to have hypoalbuminemia in the past.  On hepatic function panel today, found to have mildly elevated AST and ALT along with an elevated T. bili.  CT abdomen pelvis showing hepatic steatosis with subtle nodularity of the hepatic contour suggestive of early cirrhosis and small volume abdominopelvic ascites.  Patient does have a new mild thrombocytopenia noted on CBC today.  Liver dysfunction could be secondary to hepatic steatosis or from congestive hepatopathy.  Will check right upper quadrant ultrasound, acute hepatitis panel, PT/INR.  Given patient is on  hospice, would likely avoid further more extensive workup. -Follow-up right upper quadrant ultrasound -Follow-up acute hepatitis panel -Follow-up PT/INR -Trend platelet count on CBC -Low-sodium diet with fluid restriction  COPD -chronic and stable, no wheezing on exam -resume home inhalers  HIV Recurrent HSV -resume home biktarvy  -resume home valtrex  Depression and anxiety Bipolar 1 disorder Patient was previously on medications for this but has stopped since transition to hospice.  Hx of Vtach -resume amio and mexelitine -ICD turned off 01/28/2024 given he is on hospice    Advance Care Planning:   Code Status: Limited: Do not attempt  resuscitation (DNR) -DNR-LIMITED -Do Not Intubate/DNI    Consults: none  Family Communication: no family at bedside  Severity of Illness: The appropriate patient status for this patient is INPATIENT. Inpatient status is judged to be reasonable and necessary in order to provide the required intensity of service to ensure the patient's safety. The patient's presenting symptoms, physical exam findings, and initial radiographic and laboratory data in the context of their chronic comorbidities is felt to place them at high risk for further clinical deterioration. Furthermore, it is not anticipated that the patient will be medically stable for discharge from the hospital within 2 midnights of admission.   * I certify that at the point of admission it is my clinical judgment that the patient will require inpatient hospital care spanning beyond 2 midnights from the point of admission due to high intensity of service, high risk for further deterioration and high frequency of surveillance required.*  Portions of this note were generated with Dragon dictation software. Dictation errors may occur despite best attempts at proofreading.  Author: Briscoe Burns, MD 03/18/2024 7:49 AM  For on call review [email or web address].

## 2024-03-18 NOTE — ED Notes (Signed)
 Patient requesting pain medications. Message sent to MD about pain meds and pt low blood pressure.

## 2024-03-18 NOTE — ED Notes (Addendum)
 Pt to ultrasound

## 2024-03-18 NOTE — Progress Notes (Signed)
 Patient is active with [facility] for outpatient hospice services.  CSW notified Dahlia Client at [facility] of patient's presence in the hospital and plan for admission.  Edwin Dada, MSW, LCSW Transitions of Care  Clinical Social Worker II [phone]

## 2024-03-18 NOTE — Consult Note (Addendum)
 Cardiology Consultation   Patient ID: Unique Searfoss MRN: [id number]; DOB: [birth year]  Admit date: 03/17/2024 Date of Consult: 03/18/2024  PCP:  Jackie Plum, MD   [facility] Providers Cardiologist:  Arvilla Meres, MD  Electrophysiologist:  Regan Lemming, MD       Patient Profile:   Ryan Long is a 63 y.o. male with a hx of HIV, HTN, major depression, chronic systolic heart failure (nonischemic cardiomyopathy), and polysubstance abuse who is being seen 03/18/2024 for the evaluation of suspected acute CHF at the request of Dr. Austin Miles.  History of Present Illness:   Ryan Long has a very complex cardiac history. R/LHC in January 2025 without obstructive CAD. Severe biventricular heart failure with low cardiac output and elevated filling pressures. Plan at time of cath was admission for HF management but patient left AMA. He was then seen in follow up on 1/27 where volume status felt to be stable. Continued on Jardiance 10mg , Lasix 20mg , Toprol XL 25mg . No other GDMT able to be added due to low BP. Patient then admitted in February 02/05-02/21 for management of ventricular tachycardia/VT storm. Multiple episodes of VT and started on amiodarone gtt, having stopped his amiodarone as an outpatient, perhaps 2/2 nausea. Device therapy zones adjusted with sustained VT episode on telemetry below detection 01/24/24, again with sustained episodes of tachycardia. not an advanced therapies candidate 2/2 his poor compliance, mutliple early patient directed discharges, and no social support, increasing VT burden a marker of end stage cardiomyopathy recommended palliative care, pt requested DNR/DNI.  Eventually able to transition to PO amiodarone with NSVTs only Palliative consulted and discussed Hospice care, ultimately patient wished to pursue Hospice. His ICD Tachy therapies were turned off, he did not want to return to his group home and planned to find placement at  [facility] placement/SNF.   Patient then seen in consult on 03/02 in the setting of decompensated heart failure, acute shortness of breath, LE edema, elevated BNP. Patient IV diuresed and ultimately discharged on Jardiance, eplerenone, as well as anti-arrhythmics Mexiletine and Amiodarone. Also sent with increased Midodrine.   Per patient, he had return of exertional shortness of breath as well as 3+ pillow orthopnea a few days ago. He also had significant malaise with GI upset, vomiting, ongoing lack of appetite. Reports ongoing atypical chest pain, has had this since before his catheterization, waxes and wanes without clear provocation. Also reports significant GI upset with abdominal pain, nausea, vomiting. He continues to struggle with poor appetite secondary to loss of taste/smell. His weight continues to fall, was 78.3kg in clinic on 03/13/23, now down to 77.1kg. Patient reports compliance with medications, says that he was given new Rx for Lasix by PCP after most recent admission, has only taken once. Unable to see records of this Rx.   Labs this admission notable for creatinine 1.37 (baseline closer to 1.6), BNP 1875.5 (up from 1651 on 03/07), troponin 76->75. Albumin low at 2.9, mild transaminitis, elevated ammonia, elevated T bili.   On exam, patient reports improved breathing today compared with yesterday, continues to have intermittent sharp/central chest discomfort.    Past Medical History:  Diagnosis Date   Active smoker    AICD (automatic cardioverter/defibrillator) present    Alcohol abuse    Allergy July 2023   Anxiety    AR (allergic rhinitis)    Bipolar 1 disorder (HCC)    Cervical lymphadenitis 04/20/2021   CHF (congestive heart failure) (HCC)    Chronic bronchitis (HCC)  Chronic systolic heart failure (HCC)    Controlled substance agreement signed 10/22/2018   COPD (chronic obstructive pulmonary disease) (HCC)    COPD (chronic obstructive pulmonary disease) (HCC)  10/04/2015   Cough 12/31/2018   Crack cocaine use    Depression    Genital herpes    HIV (human immunodeficiency virus infection) (HCC) dx'd 08/1993   HLD (hyperlipidemia)    Hypertension    NICM (nonischemic cardiomyopathy) (HCC)    Echocardiogram 06/28/11: EF 30-35%, mild MR, mild LAE;  No CAD by coronary CT angiogram 3/12 at [facility]   NSVT (nonsustained ventricular tachycardia) (HCC)    PTSD (post-traumatic stress disorder)     Past Surgical History:  Procedure Laterality Date   CARDIAC DEFIBRILLATOR PLACEMENT  01/08/2018   ICD IMPLANT N/A 01/08/2018   Procedure: ICD IMPLANT;  Surgeon: Regan Lemming, MD;  Location: [facility] INVASIVE CV LAB;  Service: Cardiovascular;  Laterality: N/A;   RIGHT/LEFT HEART CATH AND CORONARY ANGIOGRAPHY N/A 01/03/2024   Procedure: RIGHT/LEFT HEART CATH AND CORONARY ANGIOGRAPHY;  Surgeon: Dolores Patty, MD;  Location: [facility] INVASIVE CV LAB;  Service: Cardiovascular;  Laterality: N/A;       Inpatient Medications: Scheduled Meds:  amiodarone  200 mg Oral Daily   aspirin EC  81 mg Oral Daily   bictegravir-emtricitabine-tenofovir AF  1 tablet Oral Daily   dronabinol  5 mg Oral BID AC   enoxaparin (LOVENOX) injection  40 mg Subcutaneous Q24H   furosemide  40 mg Intravenous Once   mexiletine  250 mg Oral Q12H   midodrine  15 mg Oral TID   mometasone-formoterol  2 puff Inhalation BID   sodium chloride flush  3 mL Intravenous Q12H   valACYclovir  500 mg Oral q AM   Continuous Infusions:  sodium chloride     PRN Meds: sodium chloride, acetaminophen, albuterol, ondansetron (ZOFRAN) IV, polyethylene glycol, sodium chloride flush  Allergies:   No Known Allergies  Social History:   Social History   Socioeconomic History   Marital status: Single    Spouse name: Not on file   Number of children: Not on file   Years of education: Not on file   Highest education level: GED or equivalent  Occupational History   Not on file   Tobacco Use   Smoking status: Every Day    Current packs/day: 0.75    Average packs/day: 0.8 packs/day for 42.0 years (31.5 ttl pk-yrs)    Types: Cigarettes   Smokeless tobacco: Never   Tobacco comments:    hoping welbutrin will help  Vaping Use   Vaping status: Never Used  Substance and Sexual Activity   Alcohol use: Not Currently    Alcohol/week: 6.0 standard drinks of alcohol    Comment: 01/08/2018 "stopped 06/2017"   Drug use: Not Currently    Types: "Crack" cocaine    Comment: 01/08/2018 "stopped 06/2017"   Sexual activity: Yes    Birth control/protection: Condom    Comment: pt. declined condoms  Other Topics Concern   Not on file  Social History Narrative   Not on file   Social Drivers of Health   Financial Resource Strain: Low Risk  (07/17/2023)   Overall Financial Resource Strain (CARDIA)    Difficulty of Paying Living Expenses: Not hard at all  Food Insecurity: No Food Insecurity (02/17/2024)   Hunger Vital Sign    Worried About Running Out of Food in the Last Year: Never true    Ran Out of Food in  the Last Year: Never true  Transportation Needs: No Transportation Needs (02/17/2024)   PRAPARE - Administrator, Civil Service (Medical): No    Lack of Transportation (Non-Medical): No  Physical Activity: Insufficiently Active (07/17/2023)   Exercise Vital Sign    Days of Exercise per Week: 3 days    Minutes of Exercise per Session: 20 min  Stress: Stress Concern Present (07/17/2023)   [employer] of Occupational Health - Occupational Stress Questionnaire    Feeling of Stress : To some extent  Social Connections: Moderately Integrated (07/17/2023)   Social Connection and Isolation Panel [NHANES]    Frequency of Communication with Friends and Family: Twice a week    Frequency of Social Gatherings with Friends and Family: More than three times a week    Attends Religious Services: 1 to 4 times per year    Active Member of [employer] or Organizations: Yes     Attends Engineer, structural: More than 4 times per year    Marital Status: Never married  Intimate Partner Violence: Not At Risk (02/17/2024)   Humiliation, Afraid, Rape, and Kick questionnaire    Fear of Current or Ex-Partner: No    Emotionally Abused: No    Physically Abused: No    Sexually Abused: No    Family History:    Family History  Problem Relation Age of Onset   Glaucoma Mother    Mental illness Mother    Vision loss Mother    Hypertension Father    CAD Father    Mental illness Father    Alcohol abuse Father    Drug abuse Father    Heart attack Father 3   Early death Father    Heart disease Father    Alcohol abuse Brother    Drug abuse Brother    Drug abuse Brother      ROS:  Please see the history of present illness.   All other ROS reviewed and negative.     Physical Exam/Data:   Vitals:   03/18/24 0730 03/18/24 0741 03/18/24 0845 03/18/24 1043  BP: (!) 76/62 (!) 82/66 (!) 84/67   Pulse: 85  71   Resp: 20 (!) 22 13   Temp:    (!) 97.5 F (36.4 C)  TempSrc:    Oral  SpO2: 94%  99%   Weight:      Height:        Intake/Output Summary (Last 24 hours) at 03/18/2024 1224 Last data filed at 03/18/2024 1107 Gross per 24 hour  Intake 600 ml  Output 800 ml  Net -200 ml      03/17/2024   11:43 PM 03/12/2024    2:31 PM 03/07/2024    5:39 AM  Last 3 Weights  Weight (lbs) 170 lb 172 lb 9.6 oz 166 lb 7.2 oz  Weight (kg) 77.111 kg 78.291 kg 75.5 kg     Body mass index is 25.85 kg/m.  General:  Frail and chronically ill appearing HEENT: normal Neck: JVP to angle of mandible Vascular: No carotid bruits; Distal pulses 2+ bilaterally Cardiac:  normal S1, S2; irregular with frequent PVCs; no murmur  Lungs:  clear to auscultation bilaterally, no wheezing, rhonchi or rales  Abd: soft, nontender, no hepatomegaly  Ext: no edema Musculoskeletal:  No deformities, BUE and BLE strength normal and equal Skin: warm and dry  Neuro:  CNs 2-12 intact, no focal  abnormalities noted Psych:  Normal affect   EKG:  The EKG was  personally reviewed and demonstrates:  sinus rhythm, significant LVH with PVC. Telemetry:  Telemetry was personally reviewed and demonstrates:  sinus rhythm with very frequent PVCs  Relevant CV Studies:  01/03/24 Findings:   Ao =88/68 (78) LV = 86/26 RA = 10 RV = 45/13 PA = 40/28 (34) PCW = 24 Thermo CO/CI = 3.8/1.9 Fick CO/CI = 3.2/1.6 PVR = 3.1 (Fick) 2.7 (TD) Ao sat = 95% PA sat = 64%, 60% PAPi = 1.2   Assessment: 1. Normal coronaries 2. Severe NICM with severe biventricular dysfunction LVEF 25% 3. Elevated filling pressures with low cardiac output  09/28/23 TTE  IMPRESSIONS     1. Left ventricular ejection fraction, by estimation, is 20 to 25%. The  left ventricle has severely decreased function. The left ventricle  demonstrates global hypokinesis. The left ventricular internal cavity size  was mildly to moderately dilated. Left  ventricular diastolic parameters are consistent with Grade I diastolic  dysfunction (impaired relaxation).   2. Right ventricular systolic function is normal. The right ventricular  size is normal. Tricuspid regurgitation signal is inadequate for assessing  PA pressure.   3. The mitral valve is normal in structure. Mild mitral valve  regurgitation.   4. The aortic valve is normal in structure. Aortic valve regurgitation is  not visualized.   5. The inferior vena cava is normal in size with greater than 50%  respiratory variability, suggesting right atrial pressure of 3 mmHg.   Comparison(s): No significant change from prior study.   FINDINGS   Left Ventricle: Left ventricular ejection fraction, by estimation, is 20  to 25%. The left ventricle has severely decreased function. The left  ventricle demonstrates global hypokinesis. The left ventricular internal  cavity size was mildly to moderately  dilated. There is no left ventricular hypertrophy. Left ventricular   diastolic parameters are consistent with Grade I diastolic dysfunction  (impaired relaxation).   Right Ventricle: The right ventricular size is normal. Right ventricular  systolic function is normal. Tricuspid regurgitation signal is inadequate  for assessing PA pressure.   Left Atrium: Left atrial size was normal in size.   Right Atrium: Right atrial size was normal in size.   Pericardium: There is no evidence of pericardial effusion.   Mitral Valve: The mitral valve is normal in structure. Mild mitral valve  regurgitation.   Tricuspid Valve: The tricuspid valve is normal in structure. Tricuspid  valve regurgitation is not demonstrated.   Aortic Valve: The aortic valve is normal in structure. Aortic valve  regurgitation is not visualized. Aortic valve mean gradient measures 5.0  mmHg. Aortic valve peak gradient measures 9.0 mmHg. Aortic valve area, by  VTI measures 2.78 cm.   Pulmonic Valve: Pulmonic valve regurgitation is not visualized.   Aorta: The aortic root and ascending aorta are structurally normal, with  no evidence of dilitation.   Venous: The inferior vena cava is normal in size with greater than 50%  respiratory variability, suggesting right atrial pressure of 3 mmHg.   IAS/Shunts: No atrial level shunt detected by color flow Doppler.   Additional Comments: A device lead is visualized.    Laboratory Data:  High Sensitivity Troponin:   Recent Labs  Lab 03/17/24 2358 03/18/24 0300  TROPONINIHS 76* 75*     Chemistry Recent Labs  Lab 03/17/24 2358  NA 134*  K 4.0  CL 106  CO2 18*  GLUCOSE 123*  BUN 18  CREATININE 1.37*  CALCIUM 8.0*  MG 2.1  GFRNONAA 58*  ANIONGAP 10  Recent Labs  Lab 03/17/24 2358  PROT 5.3*  ALBUMIN 2.9*  AST 46*  ALT 53*  ALKPHOS 99  BILITOT 2.8*   Lipids No results for input(s): "CHOL", "TRIG", "HDL", "LABVLDL", "LDLCALC", "CHOLHDL" in the last 168 hours.  Hematology Recent Labs  Lab 03/17/24 2358  WBC  3.3*  RBC 5.20  HGB 17.6*  HCT 49.6  MCV 95.4  MCH 33.8  MCHC 35.5  RDW 15.7*  PLT 141*   Thyroid No results for input(s): "TSH", "FREET4" in the last 168 hours.  BNP Recent Labs  Lab 03/17/24 2358  BNP 1,875.5*    DDimer No results for input(s): "DDIMER" in the last 168 hours.   Radiology/Studies:  CT Angio Chest Pulmonary Embolism (PE) W or WO Contrast Result Date: 03/18/2024 CLINICAL DATA:  Nausea and shortness of breath, vomiting EXAM: CT ANGIOGRAPHY CHEST CT ABDOMEN AND PELVIS WITH CONTRAST TECHNIQUE: Multidetector CT imaging of the chest was performed using the standard protocol during bolus administration of intravenous contrast. Multiplanar CT image reconstructions and MIPs were obtained to evaluate the vascular anatomy. Multidetector CT imaging of the abdomen and pelvis was performed using the standard protocol during bolus administration of intravenous contrast. RADIATION DOSE REDUCTION: This exam was performed according to the departmental dose-optimization program which includes automated exposure control, adjustment of the mA and/or kV according to patient size and/or use of iterative reconstruction technique. CONTRAST:  75mL OMNIPAQUE IOHEXOL 350 MG/ML SOLN COMPARISON:  Chest radiograph 03/18/2024 and CTA chest 12/04/2023; CT abdomen pelvis 02/04/2024 FINDINGS: CTA CHEST FINDINGS Cardiovascular: Cardiomegaly. Small pericardial effusion, increased from 12/04/2023. Normal caliber aorta. Aortic atherosclerotic calcification. Negative for pulmonary embolism. Left chest wall pacemaker. Mediastinum/Nodes: Trachea and esophagus are unremarkable. No thoracic adenopathy. Lungs/Pleura: Mild centrilobular emphysema in the upper lobes. Clustered nodularity in the right lower lobe compatible with small airway infection/inflammation. No pleural effusion or pneumothorax. Musculoskeletal: No acute fracture. Review of the MIP images confirms the above findings. CT ABDOMEN and PELVIS FINDINGS  Hepatobiliary: Hepatic steatosis. Subtle nodularity of the hepatic contour suggesting early cirrhosis. Sludge or vicarious excretion of contrast in the gallbladder. No biliary dilation. Pancreas: Fatty atrophy.  No acute abnormality. Spleen: Unremarkable. Adrenals/Urinary Tract: Normal adrenal glands. Bilateral cortical renal scarring. No urinary calculi or hydronephrosis. Unremarkable bladder. Stomach/Bowel: Colonic diverticulosis. Trace stranding about the proximal sigmoid colon suggesting mild diverticulitis. Normal appendix. Stomach is within normal limits. Vascular/Lymphatic: Aortic atherosclerosis. No enlarged abdominal or pelvic lymph nodes. Reproductive: Enlarged prostate indenting the floor of the bladder. Other: Small volume abdominopelvic ascites. No free intraperitoneal air. Musculoskeletal: No acute fracture. Review of the MIP images confirms the above findings. IMPRESSION: 1. Negative for pulmonary embolism. 2. Clustered nodularity in the right lower lobe compatible with small airway infection/inflammation. 3. Cardiomegaly. Small pericardial effusion, increased from 12/04/2023. 4. Hepatic steatosis with subtle nodularity of the hepatic contour suggesting early cirrhosis. 5. Small volume abdominopelvic ascites. 6. Mild proximal sigmoid diverticulitis. Electronically Signed   By: Minerva Fester M.D.   On: 03/18/2024 03:14   CT ABDOMEN PELVIS W CONTRAST Result Date: 03/18/2024 CLINICAL DATA:  Nausea and shortness of breath, vomiting EXAM: CT ANGIOGRAPHY CHEST CT ABDOMEN AND PELVIS WITH CONTRAST TECHNIQUE: Multidetector CT imaging of the chest was performed using the standard protocol during bolus administration of intravenous contrast. Multiplanar CT image reconstructions and MIPs were obtained to evaluate the vascular anatomy. Multidetector CT imaging of the abdomen and pelvis was performed using the standard protocol during bolus administration of intravenous contrast. RADIATION DOSE REDUCTION: This  exam  was performed according to the departmental dose-optimization program which includes automated exposure control, adjustment of the mA and/or kV according to patient size and/or use of iterative reconstruction technique. CONTRAST:  75mL OMNIPAQUE IOHEXOL 350 MG/ML SOLN COMPARISON:  Chest radiograph 03/18/2024 and CTA chest 12/04/2023; CT abdomen pelvis 02/04/2024 FINDINGS: CTA CHEST FINDINGS Cardiovascular: Cardiomegaly. Small pericardial effusion, increased from 12/04/2023. Normal caliber aorta. Aortic atherosclerotic calcification. Negative for pulmonary embolism. Left chest wall pacemaker. Mediastinum/Nodes: Trachea and esophagus are unremarkable. No thoracic adenopathy. Lungs/Pleura: Mild centrilobular emphysema in the upper lobes. Clustered nodularity in the right lower lobe compatible with small airway infection/inflammation. No pleural effusion or pneumothorax. Musculoskeletal: No acute fracture. Review of the MIP images confirms the above findings. CT ABDOMEN and PELVIS FINDINGS Hepatobiliary: Hepatic steatosis. Subtle nodularity of the hepatic contour suggesting early cirrhosis. Sludge or vicarious excretion of contrast in the gallbladder. No biliary dilation. Pancreas: Fatty atrophy.  No acute abnormality. Spleen: Unremarkable. Adrenals/Urinary Tract: Normal adrenal glands. Bilateral cortical renal scarring. No urinary calculi or hydronephrosis. Unremarkable bladder. Stomach/Bowel: Colonic diverticulosis. Trace stranding about the proximal sigmoid colon suggesting mild diverticulitis. Normal appendix. Stomach is within normal limits. Vascular/Lymphatic: Aortic atherosclerosis. No enlarged abdominal or pelvic lymph nodes. Reproductive: Enlarged prostate indenting the floor of the bladder. Other: Small volume abdominopelvic ascites. No free intraperitoneal air. Musculoskeletal: No acute fracture. Review of the MIP images confirms the above findings. IMPRESSION: 1. Negative for pulmonary embolism. 2.  Clustered nodularity in the right lower lobe compatible with small airway infection/inflammation. 3. Cardiomegaly. Small pericardial effusion, increased from 12/04/2023. 4. Hepatic steatosis with subtle nodularity of the hepatic contour suggesting early cirrhosis. 5. Small volume abdominopelvic ascites. 6. Mild proximal sigmoid diverticulitis. Electronically Signed   By: Minerva Fester M.D.   On: 03/18/2024 03:14   DG Chest Port 1 View Result Date: 03/18/2024 CLINICAL DATA:  Chest pain EXAM: PORTABLE CHEST 1 VIEW COMPARISON:  02/22/2024 FINDINGS: Cardiomegaly. Left AICD remains in place, unchanged. No confluent opacities, effusions or edema. No acute bony abnormality. IMPRESSION: No active disease. Electronically Signed   By: Charlett Nose M.D.   On: 03/18/2024 00:13     Assessment and Plan:   End stage systolic heart failure Patient presented to the ED with malaise, GI upset, nausea, vomiting, left chest pain. Also reports worsening dyspnea over the last 2 days. CTA chest without PE, possible pneumonia. CT abdomen with hepatic steatosis and early ascites. BNP is slightly increased from recent admission, 1875.5 (up from 1651 on 03/07). Patient with JVP to mandible on exam but no LE edema or pulmonary rales. Lactic acid normal at 1.2. Patient without physical exam evidence of overt volume overload. Weight is down from last check in clinic. Breathing has significantly improved with single dose of IV lasix 20mg . With low BP, would defer further IV diuresis at this time. Consider resuming low dose PO lasix at discharge, 20mg  with potassium supplementation. Continue Midodrine to support BP. Patient's prognosis is very poor, is now under hospice care with ICD therapies off. Would continue to focus on comfort measures, not able to further adjust GDMT given hypotension/low cardiac output.  Ventricular tachycardia Patient with recent admission for VT storm is now on Mexiletine and Amiodarone. He is now  coordinating with hospice care and ICD therapies have been disabled.  Continue Amiodarone 200mg  daily, Mexiletine 250mg  BID.  Chest pain Patient with chronic atypical chest pain. LHC 01/03/24 with normal coronary arteries. Troponin this admission minimally elevated and flat: troponin 76->75. Troponin is consistent with type II  MI/demand ischemia.   Per primary team: Abdominal pain Cirrhosis with transaminitis  COPD HIV  Risk Assessment/Risk Scores:        New York Heart Association (NYHA) Functional Class NYHA Class IV        For questions or updates, please contact [facility] Please consult [email or web address] for contact info under    Signed, Perlie Gold, PA-C  03/18/2024 12:24 PM  I have personally seen and examined the patient.  My HPI, Exam, and assessment and plan are below, independent of the NPP above.  Ryan Long is a 63 year old male with HIV, hypertension, and heart failure with reduced ejection fraction who presents with end stage heart failure  He presents with worsening shortness of breath and breathing difficulties, consistent with an acute exacerbation of heart failure. He has a history of ventricular tachycardia and is considered to have end-stage heart disease. He is not a candidate for LVAD, transplant, or additional therapies and has been transitioned to hospice care. Hospice has not contacted him since his discharge, possibly due to a change in his phone number. He is DNR and DNI, and his device has been turned off after a VT storm.  [facility] is hospice.   He experiences altered taste and smell, describing his taste as 'wood and gold' and an inability to smell, which affects his ability to enjoy food. He can only eat certain foods and misses eating 'the regular stuff' like bacon, sausage, and grits. He is open to trying new foods despite his altered taste and smell.  He has a history of polysubstance abuse and currently lives in a roommate house. He is  willing to move to a place where he could receive more support.  He is currently on telemetry, which shows rare PVCs and a fair amount of artifact. His creatinine levels have been noted to be 1.37 and 1.76.  Exam notable for  Gen: no distress   Neck: + JVD to the mandible at 90 degrees Cardiac: No Rubs or Gallops, no Murmur Respiratory: Clear to auscultation bilaterally, normal effort, normal  respiratory rate GI: Soft, nontender, non-distended  MS: No edema Integument: Skin feels warm Neuro:  At time of evaluation, alert and oriented to person/place/time/situation  Psych: Normal affect, patient feels ok  Tele: Rare PVCs and artifact that is not NSVT (03/18/2024)   In assessment and plan:   End Stage Heart Failure Experiencing an acute exacerbation of heart failure with reduced ejection fraction due to non-ischemic cardiomyopathy, presenting with worsening dyspnea and considered in end-stage heart failure. Not a candidate for LVAD, transplant, or additional therapies, transitioned to hospice care. Complicated by VT and polysubstance abuse. Telemetry shows rare PVCs and artifact. Elevated creatinine indicates renal involvement. Focus on symptom management due to hospice status. - DC of ASA and fluid bolus. - If SOB, he can received symptom therapy as per palliative/hospice protocol (I presume this would be 25 mcg of fentanyl /1 mg of midazolam but will defer to PC/hospice team) - given kidney function, would not be best served with morphine unless cleared by [facility] - Discussed care with Drs. Jinwala and Bensimohn - ICD therapies turned off in the past (see prior visit EP documentation)  Hospice care coordination Transitioned to hospice care due to end-stage heart failure. Lack of communication and coordination with hospice services, resulting in inadequate symptom management at home. Expressed willingness to relocate to a supportive living environment for better care.  We have discusse what  hospice means at length. -  planned for DC to hospice tomorrow - All future [facility] cardiology f/u is cancelled - he did not say to me he was changing GOC or seeking a second opinion, unclear about his 03/21/24 f/u schedule with Mr. Lenard Galloway at [facility]  Goals of Care DNR/DNI status with focus on quality of life improvement rather than life prolongation. Emphasis on comfort and enjoyment of activities and foods within reason, understanding the goal is to maximize quality of life in remaining days. - ASA stopped - Bolus stopped - regular diet started - fluid restriction removed  As his GOC is hospice, We will sign off at this time. If his GOC change, we will reach out to A[facility] and EP as appropriate, thought AHF has noted they, unfortunately, have no further therapies to offer.  Riley Lam, MD FASE [facility] Cardiologist [facility]  [address] [place], [address] [place], [place] [address] [phone]  3:45 PM

## 2024-03-18 NOTE — Hospital Course (Addendum)
 Mr. Omalley was admitted to the hospital with the working diagnosis of heart failure exacerbation.   63 yo male with the past medical history of end stage heart failure, CKD, COPD, hypertension, hyperlipidemia, depression, anxiety, bipolar and HIV who presented with dyspnea.  Patient reported progressive dyspnea on exertion, to the point where he was having symptoms at rest. Positive orthopnea, lower extremity edema and PND.  Because advance heart disease he is currently enrolled in home hospice.  On his initial physical examination his blood pressure was 93/68, HR 80, RR 22 and 02 saturation 94%, lungs with no wheezing or rhonchi, heart with S1 and S2 present and regular with no gallops, abdomen with no distention and positive lower extremity edema.   Na 134, K 4,0 Cl 106 bicarbonate 18 glucose 123, BUN 19 cr 1,37  Mg 2.1  AST 46, ALT 53, ammonia 36 BNP 1,875  High sensitive troponin 76 and 75  Wbc 3,3 hgb 17.6 plt 141 Sars covid 19 negative Influenza negative  RSV negative   Urine analysis SG 1,013, protein 30, negative leukocytes and negative hgb   Chest radiograph with cardiomegaly and bilateral hilar vascular congestion with no infiltrates or effusions, pacemaker defibrillator in place,  CT abdomen and pelvis with negative for pulmonary embolism, clustered nodularity at the right lower lobe. Cardiomegaly with small pericardial effusion (increased from 2024), hepatic steatosis with subtle nodularity of the hepatic contour suggesting early cirrhosis.  Small volume abdominopelvic ascites,   EKG 100 bpm, normal axis, qtc 475, with normal intervals, sinus rhythm with right and left atrial enlargement, 1st degree AV block, with no significant ST segment or T wave changes. Poor R R wave progression.   Cardiology was consulted with recommendations to continue palliative care. He is not candidate for advance heart failure therapies.   04/02 added analgesics. Patient would like to continue with  home hospice services at the time of his discharge.  04/03 improved volume status, correcting electrolytes, possible discharge home tomorrow with home hospice services.  04/04 signs of hypervolemia, resumed on IV furosemide.  04/05 volume status has improved, plan for discharge home and follow up with hospice services.

## 2024-03-19 DIAGNOSIS — Z21 Asymptomatic human immunodeficiency virus [HIV] infection status: Secondary | ICD-10-CM

## 2024-03-19 DIAGNOSIS — E785 Hyperlipidemia, unspecified: Secondary | ICD-10-CM

## 2024-03-19 DIAGNOSIS — I5023 Acute on chronic systolic (congestive) heart failure: Secondary | ICD-10-CM | POA: Diagnosis not present

## 2024-03-19 DIAGNOSIS — I10 Essential (primary) hypertension: Secondary | ICD-10-CM

## 2024-03-19 DIAGNOSIS — N1831 Chronic kidney disease, stage 3a: Secondary | ICD-10-CM | POA: Diagnosis not present

## 2024-03-19 DIAGNOSIS — I4891 Unspecified atrial fibrillation: Secondary | ICD-10-CM | POA: Diagnosis not present

## 2024-03-19 LAB — BASIC METABOLIC PANEL WITH GFR
Anion gap: 9 (ref 5–15)
BUN: 18 mg/dL (ref 8–23)
CO2: 25 mmol/L (ref 22–32)
Calcium: 8.1 mg/dL — ABNORMAL LOW (ref 8.9–10.3)
Chloride: 104 mmol/L (ref 98–111)
Creatinine, Ser: 1.63 mg/dL — ABNORMAL HIGH (ref 0.61–1.24)
GFR, Estimated: 47 mL/min — ABNORMAL LOW (ref >60)
Glucose, Bld: 96 mg/dL (ref 70–99)
Potassium: 2.9 mmol/L — ABNORMAL LOW (ref 3.5–5.1)
Sodium: 138 mmol/L (ref 135–145)

## 2024-03-19 MED ORDER — POTASSIUM CHLORIDE 20 MEQ PO PACK
60.0000 meq | PACK | Freq: Once | ORAL | Status: AC
  Administered 2024-03-19: 60 meq via ORAL
  Filled 2024-03-19: qty 3

## 2024-03-19 MED ORDER — TAMSULOSIN HCL 0.4 MG PO CAPS
0.4000 mg | ORAL_CAPSULE | Freq: Once | ORAL | Status: AC
  Administered 2024-03-19: 0.4 mg via ORAL
  Filled 2024-03-19: qty 1

## 2024-03-19 MED ORDER — HYDROMORPHONE HCL 1 MG/ML IJ SOLN: 1.0000 mg | INTRAMUSCULAR | Status: DC | PRN

## 2024-03-19 MED ORDER — TAMSULOSIN HCL 0.4 MG PO CAPS
0.8000 mg | ORAL_CAPSULE | Freq: Every day | ORAL | Status: DC
  Administered 2024-03-20 – 2024-03-23 (×4): 0.8 mg via ORAL
  Filled 2024-03-19 (×5): qty 2

## 2024-03-19 MED ORDER — OXYCODONE HCL 5 MG PO TABS
5.0000 mg | ORAL_TABLET | ORAL | Status: DC | PRN
  Administered 2024-03-19 – 2024-03-22 (×4): 5 mg via ORAL
  Filled 2024-03-19 (×5): qty 1

## 2024-03-19 MED ORDER — TORSEMIDE 20 MG PO TABS
40.0000 mg | ORAL_TABLET | Freq: Every day | ORAL | Status: DC
  Administered 2024-03-19 – 2024-03-21 (×3): 40 mg via ORAL
  Filled 2024-03-19 (×3): qty 2

## 2024-03-19 MED ORDER — POTASSIUM CHLORIDE 20 MEQ PO PACK: 40.0000 meq | PACK | Freq: Once | ORAL | Status: DC

## 2024-03-19 MED ORDER — TAMSULOSIN HCL 0.4 MG PO CAPS
0.4000 mg | ORAL_CAPSULE | Freq: Every day | ORAL | Status: DC
  Administered 2024-03-19: 0.4 mg via ORAL
  Filled 2024-03-19: qty 1

## 2024-03-19 NOTE — Assessment & Plan Note (Signed)
 Clinically stable, currently off medical therapy.  Follow up as outpatient.

## 2024-03-19 NOTE — Assessment & Plan Note (Signed)
 Continue statin therapy.

## 2024-03-19 NOTE — Assessment & Plan Note (Signed)
 Continue rate control with amiodarone and mexelitine  Not on anticoagulation with DOAC. Continue with aspirin.

## 2024-03-19 NOTE — Assessment & Plan Note (Addendum)
 2024 echocardiogram with reduced LV systolic function EF 20 to 25%, global hypokinesis, mild to moderate LV cavity dilatation, RV systolic function preserved, mild MR,   Patient was placed on IV furosemide for diuresis, negative fluid balance was achieved, - 3,928 ml, with significant improvement in his symptoms.   Patient will continue with torsemide 40 mg daily and take extra 20 mg in case of volume overload.  Blood pressure support with midodrine. Limited pharmacologic therapy due to reduced GFR and risk of hypotension.  Poor prognosis, plan to resume hospice services at the time of discharge.  Continue with oxycodone for pain control   History of Ventricular tachycardia, continue with mexelitine. ICD therapies have been turned off.  Patient is DNR.

## 2024-03-19 NOTE — Assessment & Plan Note (Signed)
 Continue blood pressure support with midodrine.  Caution diuresis with oral torsemide.

## 2024-03-19 NOTE — Assessment & Plan Note (Addendum)
 Hyponatremia, hypomagnesemia and hypokalemia.   Stable renal function with serum cr at 1,60 with K at 3,4 and serum bicarbonate at 24 Na 137 and Mg 1,8   Patient will get 40 meq Kcl po and 2 g mag IV prior to her discharge.  Follow up renal function and electrolytes as outpatient.

## 2024-03-19 NOTE — Progress Notes (Addendum)
 Progress Note   Patient: Ryan Long ZOX:[id number] DOB: 05-12-1961 DOA: 03/17/2024     1 DOS: the patient was seen and examined on 03/19/2024   Brief hospital course: Ryan Long was admitted to the hospital with the working diagnosis of heart failure exacerbation.   63 yo male with the past medical history of end stage heart failure, CKD, COPD, hypertension, hyperlipidemia, depression, anxiety, bipolar and HIV who presented with dyspnea.  Patient reported progressive dyspnea on exertion, to the point where he was having symptoms at rest. Positive orthopnea, lower extremity edema and PND.  Because advance heart disease he is currently enrolled in home hospice.  On his initial physical examination his blood pressure was 93/68, HR 80, RR 22 and 02 saturation 94%, lungs with no wheezing or rhonchi, heart with S1 and S2 present and regular with no gallops, abdomen with no distention and positive lower extremity edema.   Cardiology was consulted with recommendations to continue palliative care. He is not candidate for advance heart failure therapies.   04/02 added analgesics. Patient would like to continue with home hospice services at the time of his discharge.   Assessment and Plan: Acute on chronic systolic CHF (congestive heart failure) (HCC) 2024 echocardiogram with reduced LV systolic function EF 20 to 25%, global hypokinesis, mild to moderate LV cavity dilatation, RV systolic function preserved, mild MR,   Urine output is 1400 ml Systolic blood pressure 96 mmHg range.   Patient will continue medical therapy with torsemide 40 mg daily Blood pressure support with midodrine. Limited pharmacologic therapy due to reduced GFR and risk of hypotension.  Poor prognosis, will resume hospice services at the time of discharge.  Add oxycodone and hydromorphone for pain control.   History of Ventricular tachycardia, continue with mexelitine. ICD therapies have been turned off.  Patient is  DNR.    Atrial fibrillation (HCC) Continue rate control with amiodarone and mexelitine  Not on anticoagulation with DOAC. Continue with aspirin.   Hypertension Continue blood pressure support with midodrine.  Caution diuresis with oral torsemide.   Chronic kidney disease, stage 3a (HCC) Hyponatremia and hypokalemia.   Renal function with serum cr at 1.63 with K at 2,9 and serum bicarbonate at 25   Added 60 meq Kcl today.  Continue diuresis with oral torsemide 40 mg daily Follow up renal function and electrolytes in am.   HIV (human immunodeficiency virus infection) (HCC) Continue with antiretroviral therapy.   BPH (benign prostatic hyperplasia) No signs of urinary retention   COPD (chronic obstructive pulmonary disease) (HCC) Continue 02 monitoring No clinical signs of exacerbation   Hyperlipidemia Continue statin therapy   Bipolar 1 disorder (HCC) Clinically stable, currently off medical therapy.  Follow up as outpatient.         Subjective: Patient is having constant chest pain, moderate to severe in intensity, no improving or worsening factors, has chronic dyspnea. No nausea or vomiting and able to tolerate po   Physical Exam: Vitals:   03/19/24 0836 03/19/24 0837 03/19/24 0947 03/19/24 1055  BP: (!) 87/76 (!) 85/70 96/75 96/81   Pulse: 93 87 96 88  Resp:    18  Temp:    98.1 F (36.7 C)  TempSrc:    Axillary  SpO2: 100% 100%  100%  Weight:      Height:       Neurology awake and alert, very weak and deconditioned  ENT with positive pallor, with no icterus Cardiovascular with S1 and S2 present and regular, with  positive systolic murmur at the apex Mild JVD No lower extremity edema Respiratory with mild rales at bases with no wheezing or rhonchi Abdomen with no distention   Data Reviewed:    Family Communication: no family at the bedside   Disposition: Status is: Inpatient Remains inpatient appropriate because: Palliative care   Planned  Discharge Destination: Home    Author: Coralie Keens, MD 03/19/2024 3:38 PM  For on call review [email or web address].

## 2024-03-19 NOTE — Plan of Care (Signed)
  Problem: Clinical Measurements: Goal: Ability to maintain clinical measurements within normal limits will improve Outcome: Progressing Goal: Diagnostic test results will improve Outcome: Progressing Goal: Respiratory complications will improve Outcome: Progressing   Problem: Activity: Goal: Risk for activity intolerance will decrease Outcome: Progressing   

## 2024-03-19 NOTE — Assessment & Plan Note (Signed)
Continue with antiretroviral therapy.  

## 2024-03-19 NOTE — Assessment & Plan Note (Addendum)
 No clinical signs of exacerbation  Continue with bronchodilator therapy

## 2024-03-19 NOTE — Progress Notes (Signed)
   03/19/24 0837  Vitals  BP (!) 85/70  MAP (mmHg) 75  BP Location Left Arm  BP Method Automatic  Patient Position (if appropriate) Lying  Pulse Rate 87  Pulse Rate Source Monitor  ECG Heart Rate 90  MEWS COLOR  MEWS Score Color Green  Oxygen Therapy  SpO2 100 %  Pain Assessment  Pain Scale 0-10  Pain Score 0  MEWS Score  MEWS Temp 0  MEWS Systolic 1  MEWS Pulse 0  MEWS RR 0  MEWS LOC 0  MEWS Score 1   Patient is lying in bed, asymptomatic. RN administered scheduled PO midodrine and will reassess BP in one hour.

## 2024-03-19 NOTE — Assessment & Plan Note (Signed)
 No signs of urinary retention.

## 2024-03-19 NOTE — Plan of Care (Signed)
  Problem: Activity: °Goal: Risk for activity intolerance will decrease °Outcome: Progressing °  °Problem: Elimination: °Goal: Will not experience complications related to urinary retention °Outcome: Progressing °  °Problem: Safety: °Goal: Ability to remain free from injury will improve °Outcome: Progressing °  °Problem: Skin Integrity: °Goal: Risk for impaired skin integrity will decrease °Outcome: Progressing °  °

## 2024-03-19 NOTE — TOC Initial Note (Addendum)
 Transition of Care [facility]) - Initial/Assessment Note    Patient Details  Name: Ryan Long MRN: [id number] Date of Birth: 1961/01/15  [facility] of [facility]) CM/SW Contact:    Leone Haven, RN Phone Number: 03/19/2024, 3:58 PM  Clinical Narrative:                 From home with spouse, has PCP and insurance on file, states has no HH services in place at this time, has a walker at home.  States he is not sure who will transport him home at dc and has no support system, states gets medications from [facility] on [place].  Pta self ambulatory  with walker.  NCM asked if he already has home hospice, he says no, they were trying to set this up for him on his last admit but he did not follow thru on it.  He states he would like to go ahead and have this set up now.  NCM informed him will have [facility] to call him then to go over with him.  He states ok. NCM contacted Roxanne with [facility], she states yes he was a nonadmit back in February, they will reach back out to him again. Roxanne states she will have [facility] to call patient regarding home hospice.  Expected Discharge Plan: Home/Self Care Barriers to Discharge: Continued Medical Work up   Patient Goals and CMS Choice Patient states their goals for this hospitalization and ongoing recovery are:: return to boarding house          Expected Discharge Plan and Services   Discharge Planning Services: CM Consult Post Acute Care Choice: NA Living arrangements for the past 2 months: Boarding House                 DME Arranged: N/A DME Agency: NA       HH Arranged: NA          Prior Living Arrangements/Services Living arrangements for the past 2 months: [employer] Lives with:: Roommate Patient language and need for interpreter reviewed:: Yes Do you feel safe going back to the place where you live?: Yes      Need for Family Participation in Patient Care: Yes (Comment) Care giver support system in place?:  No (comment) Current home services: DME (walker) Criminal Activity/Legal Involvement Pertinent to Current Situation/Hospitalization: No - Comment as needed  Activities of Daily Living   ADL Screening (condition at time of admission) Independently performs ADLs?: Yes (appropriate for developmental age) Does the patient have a NEW difficulty with bathing/dressing/toileting/self-feeding that is expected to last >3 days?: No Does the patient have a NEW difficulty with getting in/out of bed, walking, or climbing stairs that is expected to last >3 days?: No Does the patient have a NEW difficulty with communication that is expected to last >3 days?: No Is the patient deaf or have difficulty hearing?: No Does the patient have difficulty seeing, even when wearing glasses/contacts?: No Does the patient have difficulty concentrating, remembering, or making decisions?: No  Permission Sought/Granted Permission sought to share information with : Case Manager Permission granted to share information with : Yes, Verbal Permission Granted     Permission granted to share info w AGENCY: [facility]        Emotional Assessment Appearance:: Appears stated age Attitude/Demeanor/Rapport: Engaged Affect (typically observed): Appropriate Orientation: : Oriented to Self, Oriented to Place, Oriented to Situation, Oriented to  Time Alcohol / Substance Use: Not Applicable Psych Involvement: No (comment)  Admission diagnosis:  Acute on chronic heart failure (HCC) [I50.9] Chest pain, unspecified type [R07.9] Acute on chronic congestive heart failure, unspecified heart failure type [facility]) [I50.9] Patient Active Problem List   Diagnosis Date Noted   Acute on chronic heart failure (HCC) 03/18/2024   Chronic kidney disease, stage 3a (HCC) 02/22/2024   Acute on chronic HFrEF (heart failure with reduced ejection fraction) (HCC) 02/17/2024   Chest pain 02/17/2024   Ventricular tachycardia (HCC) 01/23/2024    Acute on chronic systolic CHF (congestive heart failure) (HCC) 01/03/2024   Asthmatic bronchitis 01/24/2023   Rash and nonspecific skin eruption 12/19/2022   Arm numbness 11/15/2022   Aortic atherosclerosis (HCC) 11/15/2022   Chronic kidney disease 08/08/2022   Goals of care, counseling/discussion 08/08/2022   Cigarette smoker 01/26/2022   ETD (Eustachian tube dysfunction), right 01/26/2022   Allergy to pollen 01/26/2022   Polycythemia 12/08/2021   Vitamin D deficiency 12/08/2021   Hip pain 01/06/2021   VT (ventricular tachycardia) (HCC) 08/25/2020   HIV positive (HCC) 07/12/2020   Atrial fibrillation (HCC) 06/10/2020   BPH (benign prostatic hyperplasia) 01/28/2020   HFrEF (heart failure with reduced ejection fraction) (HCC) 01/28/2020   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   Achilles tendinitis, left leg 04/03/2019   AICD (automatic cardioverter/defibrillator) present 08/15/2018   Abnormal EKG 06/28/2018   Adjustment disorder with mixed disturbance of emotions and conduct    NICM (nonischemic cardiomyopathy) (HCC) 01/08/2018   Anxiety 12/20/2017   Bipolar 1 disorder (HCC) 10/10/2017   Chronic systolic heart failure (HCC) 05/13/2017   HSV-2 infection 07/17/2016   History of attempted suicide 04/14/2016   History of alcoholism (HCC) 04/14/2016   History of substance abuse (HCC) 04/14/2016   Constipation 01/17/2016   COPD (chronic obstructive pulmonary disease) (HCC) 10/04/2015   Abnormal thyroid stimulating hormone (TSH) level 10/04/2015   MDD (major depressive disorder), recurrent episode, severe (HCC) 09/23/2015   Hypertension 09/10/2015   PTSD (post-traumatic stress disorder) 08/07/2015   Primary insomnia 04/02/2015   Herpesviral infection of penis 04/02/2015   Depression with suicidal ideation 07/03/2014   Generalized anxiety disorder 05/29/2014   Tobacco use disorder 02/17/2013   Heart failure (HCC) 09/19/2011   Other primary cardiomyopathies 08/24/2011   HIV (human  immunodeficiency virus infection) (HCC) 02/01/2009   Recurrent HSV (herpes simplex virus) 02/01/2009   Hyperlipidemia 02/01/2009   Allergic rhinitis 02/01/2009   PCP:  Jackie Plum, MD Pharmacy:  No Pharmacies Listed    Social Drivers of Health (SDOH) Social History: SDOH Screenings   Food Insecurity: No Food Insecurity (03/18/2024)  Housing: Low Risk  (03/18/2024)  Transportation Needs: No Transportation Needs (03/18/2024)  Utilities: Not At Risk (03/18/2024)  Alcohol Screen: Low Risk  (07/17/2023)  Depression (PHQ2-9): Low Risk  (08/01/2023)  Financial Resource Strain: Low Risk  (07/17/2023)  Physical Activity: Insufficiently Active (07/17/2023)  Social Connections: Moderately Integrated (03/18/2024)  Stress: Stress Concern Present (07/17/2023)  Tobacco Use: High Risk (03/17/2024)  Health Literacy: Adequate Health Literacy (07/18/2023)   SDOH Interventions:     Readmission Risk Interventions    03/19/2024    3:54 PM  Readmission Risk Prevention Plan  Transportation Screening Complete  PCP or Specialist Appt within 3-5 Days Complete  HRI or Home Care Consult Complete  Medication Review (RN Care Manager) Complete

## 2024-03-20 DIAGNOSIS — R3911 Hesitancy of micturition: Secondary | ICD-10-CM

## 2024-03-20 DIAGNOSIS — N401 Enlarged prostate with lower urinary tract symptoms: Secondary | ICD-10-CM

## 2024-03-20 DIAGNOSIS — I4891 Unspecified atrial fibrillation: Secondary | ICD-10-CM | POA: Diagnosis not present

## 2024-03-20 DIAGNOSIS — N1831 Chronic kidney disease, stage 3a: Secondary | ICD-10-CM | POA: Diagnosis not present

## 2024-03-20 DIAGNOSIS — I10 Essential (primary) hypertension: Secondary | ICD-10-CM | POA: Diagnosis not present

## 2024-03-20 DIAGNOSIS — I5023 Acute on chronic systolic (congestive) heart failure: Secondary | ICD-10-CM | POA: Diagnosis not present

## 2024-03-20 LAB — BASIC METABOLIC PANEL WITH GFR
Anion gap: 10 (ref 5–15)
BUN: 18 mg/dL (ref 8–23)
CO2: 24 mmol/L (ref 22–32)
Calcium: 8.1 mg/dL — ABNORMAL LOW (ref 8.9–10.3)
Chloride: 104 mmol/L (ref 98–111)
Creatinine, Ser: 1.47 mg/dL — ABNORMAL HIGH (ref 0.61–1.24)
GFR, Estimated: 54 mL/min — ABNORMAL LOW (ref >60)
Glucose, Bld: 110 mg/dL — ABNORMAL HIGH (ref 70–99)
Potassium: 3.1 mmol/L — ABNORMAL LOW (ref 3.5–5.1)
Sodium: 138 mmol/L (ref 135–145)

## 2024-03-20 LAB — MAGNESIUM: Magnesium: 1.6 mg/dL — ABNORMAL LOW (ref 1.7–2.4)

## 2024-03-20 MED ORDER — MAGNESIUM SULFATE 4 GM/100ML IV SOLN
4.0000 g | Freq: Once | INTRAVENOUS | Status: AC
  Administered 2024-03-20: 4 g via INTRAVENOUS
  Filled 2024-03-20: qty 100

## 2024-03-20 MED ORDER — ENSURE ENLIVE PO LIQD
237.0000 mL | Freq: Two times a day (BID) | ORAL | Status: DC
  Administered 2024-03-21 – 2024-03-23 (×5): 237 mL via ORAL

## 2024-03-20 MED ORDER — POTASSIUM CHLORIDE CRYS ER 20 MEQ PO TBCR
40.0000 meq | EXTENDED_RELEASE_TABLET | ORAL | Status: AC
Start: 2024-03-20 — End: 2024-03-20
  Administered 2024-03-20 (×2): 40 meq via ORAL
  Filled 2024-03-20 (×2): qty 2

## 2024-03-20 MED ORDER — POTASSIUM CHLORIDE CRYS ER 20 MEQ PO TBCR: 40.0000 meq | EXTENDED_RELEASE_TABLET | Freq: Once | ORAL | Status: DC

## 2024-03-20 MED ORDER — NITROGLYCERIN 0.4 MG SL SUBL
SUBLINGUAL_TABLET | SUBLINGUAL | Status: AC
  Filled 2024-03-20: qty 1

## 2024-03-20 MED ORDER — TRIMETHOBENZAMIDE HCL 100 MG/ML IM SOLN
200.0000 mg | Freq: Four times a day (QID) | INTRAMUSCULAR | Status: DC | PRN
  Administered 2024-03-20 – 2024-03-21 (×3): 200 mg via INTRAMUSCULAR
  Filled 2024-03-20 (×5): qty 2

## 2024-03-20 NOTE — Progress Notes (Addendum)
 Progress Note   Patient: Ryan Long KVQ:[id number] DOB: March 17, 1961 DOA: 03/17/2024     2 DOS: the patient was seen and examined on 03/20/2024   Brief hospital course: Ryan Long was admitted to the hospital with the working diagnosis of heart failure exacerbation.   63 yo male with the past medical history of end stage heart failure, CKD, COPD, hypertension, hyperlipidemia, depression, anxiety, bipolar and HIV who presented with dyspnea.  Patient reported progressive dyspnea on exertion, to the point where he was having symptoms at rest. Positive orthopnea, lower extremity edema and PND.  Because advance heart disease he is currently enrolled in home hospice.  On his initial physical examination his blood pressure was 93/68, HR 80, RR 22 and 02 saturation 94%, lungs with no wheezing or rhonchi, heart with S1 and S2 present and regular with no gallops, abdomen with no distention and positive lower extremity edema.   Cardiology was consulted with recommendations to continue palliative care. He is not candidate for advance heart failure therapies.   04/02 added analgesics. Patient would like to continue with home hospice services at the time of his discharge.  04/03 improved volume status, correcting electrolytes, possible discharge home tomorrow with home hospice services.   Assessment and Plan: Acute on chronic systolic CHF (congestive heart failure) (HCC) 2024 echocardiogram with reduced LV systolic function EF 20 to 25%, global hypokinesis, mild to moderate LV cavity dilatation, RV systolic function preserved, mild MR,   Urine output is 1000 ml Systolic blood pressure 90 mmHg range.   Patient will continue medical therapy with torsemide 40 mg daily Blood pressure support with midodrine. Limited pharmacologic therapy due to reduced GFR and risk of hypotension.  Poor prognosis, plan to resume hospice services at the time of discharge.  Continue with oxycodone and hydromorphone  for pain control.   History of Ventricular tachycardia, continue with mexelitine. ICD therapies have been turned off.  Patient is DNR.    Atrial fibrillation (HCC) Continue rate control with amiodarone and mexelitine  Not on anticoagulation with DOAC. Continue with aspirin.   Hypertension Continue blood pressure support with midodrine.  Caution diuresis with oral torsemide.   Chronic kidney disease, stage 3a (HCC) Hyponatremia, hypomagnesemia and hypokalemia.   Renal function with serum cr at 1,47 with K at 3,1 and serum bicarbonate at 24 Mg 1,6   Continue K correction with Kcl 40 meq x2 and add 4 g mag sulfate IV  Continue diuresis with oral torsemide 40 mg daily Follow up renal function and electrolytes in am.   HIV (human immunodeficiency virus infection) (HCC) Continue with antiretroviral therapy.   BPH (benign prostatic hyperplasia) No signs of urinary retention   COPD (chronic obstructive pulmonary disease) (HCC) Continue 02 monitoring No clinical signs of exacerbation   Hyperlipidemia Continue statin therapy   Bipolar 1 disorder (HCC) Clinically stable, currently off medical therapy.  Follow up as outpatient.        Subjective: Patient with improved in chest pain with oral analgesics, dyspnea also improved.   Physical Exam: Vitals:   03/20/24 0115 03/20/24 0434 03/20/24 0815 03/20/24 1235  BP: 93/73 97/82 (!) 118/93 (!) 84/70  Pulse: (!) 44 85 81 96  Resp: 17 16 18 17   Temp: (!) 97.3 F (36.3 C) 97.6 F (36.4 C) (!) 97.4 F (36.3 C) (!) 97.5 F (36.4 C)  TempSrc: Oral Oral Oral Oral  SpO2: 100% 100% 100% 99%  Weight:  74.5 kg    Height:  Neurology awake and alert ENT with mild pallor with no icterus Cardiovascular with S1 and S2 present and regular with no gallops or rubs, positive systolic murmur at the apex No JVD Trace lower extremity edema Respiratory with no rales or wheezing, no rhonchi Abdomen with no distention  Data  Reviewed:    Family Communication: no family at the bedside   Disposition: Status is: Inpatient Remains inpatient appropriate because: diuresis   Planned Discharge Destination: Home      Author: Coralie Keens, MD 03/20/2024 3:22 PM  For on call review [email or web address].

## 2024-03-20 NOTE — Plan of Care (Signed)
 Alert and oriented x4. Ambulatory in the hallways and in room.  Up ad lib to chair and to ambulate to bathroom.  Medicated for pain and nausea, see MAR for details.   Problem: Education: Goal: Knowledge of General Education information will improve Description: Including pain rating scale, medication(s)/side effects and non-pharmacologic comfort measures Outcome: Progressing   Problem: Health Behavior/Discharge Planning: Goal: Ability to manage health-related needs will improve Outcome: Progressing   Problem: Clinical Measurements: Goal: Ability to maintain clinical measurements within normal limits will improve Outcome: Progressing Goal: Will remain free from infection Outcome: Progressing Goal: Diagnostic test results will improve Outcome: Progressing

## 2024-03-21 DIAGNOSIS — F319 Bipolar disorder, unspecified: Secondary | ICD-10-CM

## 2024-03-21 DIAGNOSIS — N1831 Chronic kidney disease, stage 3a: Secondary | ICD-10-CM | POA: Diagnosis not present

## 2024-03-21 DIAGNOSIS — I10 Essential (primary) hypertension: Secondary | ICD-10-CM | POA: Diagnosis not present

## 2024-03-21 DIAGNOSIS — J449 Chronic obstructive pulmonary disease, unspecified: Secondary | ICD-10-CM

## 2024-03-21 DIAGNOSIS — I5023 Acute on chronic systolic (congestive) heart failure: Secondary | ICD-10-CM | POA: Diagnosis not present

## 2024-03-21 DIAGNOSIS — I4891 Unspecified atrial fibrillation: Secondary | ICD-10-CM | POA: Diagnosis not present

## 2024-03-21 LAB — BASIC METABOLIC PANEL WITH GFR
Anion gap: 10 (ref 5–15)
BUN: 20 mg/dL (ref 8–23)
CO2: 22 mmol/L (ref 22–32)
Calcium: 8.1 mg/dL — ABNORMAL LOW (ref 8.9–10.3)
Chloride: 106 mmol/L (ref 98–111)
Creatinine, Ser: 1.64 mg/dL — ABNORMAL HIGH (ref 0.61–1.24)
GFR, Estimated: 47 mL/min — ABNORMAL LOW (ref >60)
Glucose, Bld: 137 mg/dL — ABNORMAL HIGH (ref 70–99)
Potassium: 3.8 mmol/L (ref 3.5–5.1)
Sodium: 138 mmol/L (ref 135–145)

## 2024-03-21 LAB — MAGNESIUM: Magnesium: 2.2 mg/dL (ref 1.7–2.4)

## 2024-03-21 MED ORDER — POTASSIUM CHLORIDE CRYS ER 20 MEQ PO TBCR
40.0000 meq | EXTENDED_RELEASE_TABLET | Freq: Once | ORAL | Status: AC
  Administered 2024-03-21: 40 meq via ORAL
  Filled 2024-03-21: qty 2

## 2024-03-21 MED ORDER — FUROSEMIDE 10 MG/ML IJ SOLN
60.0000 mg | Freq: Two times a day (BID) | INTRAMUSCULAR | Status: DC
  Administered 2024-03-21 – 2024-03-22 (×2): 60 mg via INTRAVENOUS
  Filled 2024-03-21 (×2): qty 6

## 2024-03-21 NOTE — Plan of Care (Signed)
  Problem: Health Behavior/Discharge Planning: Goal: Ability to manage health-related needs will improve Outcome: Progressing   Problem: Education: Goal: Knowledge of General Education information will improve Description: Including pain rating scale, medication(s)/side effects and non-pharmacologic comfort measures Outcome: Progressing   Problem: Safety: Goal: Ability to remain free from injury will improve Outcome: Progressing   Problem: Pain Managment: Goal: General experience of comfort will improve and/or be controlled Outcome: Progressing

## 2024-03-21 NOTE — TOC Progression Note (Signed)
 Transition of Care [facility]) - Progression Note    Patient Details  Name: Ryan Long MRN: [id number] Date of Birth: [birth year]  [facility]) CM/SW Contact  Leone Haven, RN Phone Number: 03/21/2024, 3:38 PM  Clinical Narrative:    for poss dc tomorrow, please call Dahlia Client with [facility]  [phone] when he is ready for dc so they can have a Nurse ready to see him.   Expected Discharge Plan: Home/Self Care Barriers to Discharge: Continued Medical Work up  Expected Discharge Plan and Services   Discharge Planning Services: CM Consult Post Acute Care Choice: NA Living arrangements for the past 2 months: Boarding House                 DME Arranged: N/A DME Agency: NA       HH Arranged: NA           Social Determinants of Health (SDOH) Interventions SDOH Screenings   Food Insecurity: No Food Insecurity (03/18/2024)  Housing: Low Risk  (03/18/2024)  Transportation Needs: No Transportation Needs (03/18/2024)  Utilities: Not At Risk (03/18/2024)  Alcohol Screen: Low Risk  (07/17/2023)  Depression (PHQ2-9): Low Risk  (08/01/2023)  Financial Resource Strain: Low Risk  (07/17/2023)  Physical Activity: Insufficiently Active (07/17/2023)  Social Connections: Moderately Integrated (03/18/2024)  Stress: Stress Concern Present (07/17/2023)  Tobacco Use: High Risk (03/17/2024)  Health Literacy: Adequate Health Literacy (07/18/2023)    Readmission Risk Interventions    03/19/2024    3:54 PM  Readmission Risk Prevention Plan  Transportation Screening Complete  PCP or Specialist Appt within 3-5 Days Complete  HRI or Home Care Consult Complete  Medication Review (RN Care Manager) Complete

## 2024-03-21 NOTE — Care Management Important Message (Signed)
 Important Message  Patient Details  Name: Ryan Long MRN: [id number] Date of Birth: 03/27/1961   Important Message Given:  Yes - Medicare IM     Renie Ora 03/21/2024, 9:35 AM

## 2024-03-21 NOTE — TOC Progression Note (Signed)
 Transition of Care [facility]) - Progression Note    Patient Details  Name: Ryan Long MRN: [id number] Date of Birth: December 08, 1961  Transition of Care [facility]) CM/SW Contact  Leone Haven, RN Phone Number: 03/21/2024, 11:22 AM  Clinical Narrative:    NCM asked patient if he wanted a rollator, he states he already has a walker.  He also states he will need a cab to go home at dc.   Expected Discharge Plan: Home/Self Care Barriers to Discharge: Continued Medical Work up  Expected Discharge Plan and Services   Discharge Planning Services: CM Consult Post Acute Care Choice: NA Living arrangements for the past 2 months: Boarding House                 DME Arranged: N/A DME Agency: NA       HH Arranged: NA           Social Determinants of Health (SDOH) Interventions SDOH Screenings   Food Insecurity: No Food Insecurity (03/18/2024)  Housing: Low Risk  (03/18/2024)  Transportation Needs: No Transportation Needs (03/18/2024)  Utilities: Not At Risk (03/18/2024)  Alcohol Screen: Low Risk  (07/17/2023)  Depression (PHQ2-9): Low Risk  (08/01/2023)  Financial Resource Strain: Low Risk  (07/17/2023)  Physical Activity: Insufficiently Active (07/17/2023)  Social Connections: Moderately Integrated (03/18/2024)  Stress: Stress Concern Present (07/17/2023)  Tobacco Use: High Risk (03/17/2024)  Health Literacy: Adequate Health Literacy (07/18/2023)    Readmission Risk Interventions    03/19/2024    3:54 PM  Readmission Risk Prevention Plan  Transportation Screening Complete  PCP or Specialist Appt within 3-5 Days Complete  HRI or Home Care Consult Complete  Medication Review (RN Care Manager) Complete

## 2024-03-21 NOTE — Evaluation (Signed)
 Physical Therapy Brief Evaluation and Discharge Note Patient Details Name: Ryan Long MRN: [id number] DOB: September 03, 1961 Today's Date: 03/21/2024   History of Present Illness  Ryan Long is a 63 y.o. male admitted 03/17/24 with the working diagnosis of heart failure exacerbation. Pt presented with DOE, orthopnea, and LE edema. PMH of end stage heart failure, CKD, COPD, hypertension, hyperlipidemia, depression, anxiety, bipolar, and HIV. Of note, recent hospitalizations 2/5-2/21 and 3/1-3/21 for heart failure.   Clinical Impression  Pt admitted with above diagnosis. PTA, pt was independent with functional mobility, ADLs, and IADLs. He rents a room at a [employer]" which has 7 STE. His bedroom is on the main level, but the shared bathroom is up a full flight of stairs and the laundry is located in the basement down 6 steps. He performed all functional mobility with modI, no LOB, physical assistance, or VC for sequencing. He denied SOB/DOE and SpO2 >93% on RA with activity. Pt c/o fatigue and reduced cardiopulmonary endurance when he returns home. Recommend use of rollator to aid in energy conservation. Educated pt on energy conservation techniques as well as safe use and sequencing of rollator. Pt appears to be at his baseline function, no further PT services indicated. Recommend OOB<>chair 3x/day and gait 2x/day while admitted.       PT Assessment Patient does not need any further PT services  Assistance Needed at Discharge  PRN    Equipment Recommendations Rollator (4 wheels)  Recommendations for Other Services       Precautions/Restrictions Precautions Precautions: Fall Recall of Precautions/Restrictions: Intact Restrictions Weight Bearing Restrictions Per Provider Order: No        Mobility  Bed Mobility   Supine/Sidelying to sit: Modified independent (Device/Increased time) Sit to supine/sidelying: Modified independent (Device/Increased time) General bed mobility  comments: HOB slightly elevated. No physical assistance or VC for sequencing.  Transfers Overall transfer level: Modified independent Equipment used: None Transfers: Sit to/from Stand             General transfer comment: Pt stood from lowest bed height by pushing up with BUE support. Good eccentric control with sitting.    Ambulation/Gait Ambulation/Gait assistance: Modified independent (Device/Increase time) Gait Distance (Feet): 250 Feet Assistive device: None Gait Pattern/deviations: WFL(Within Functional Limits), Step-through pattern Gait Speed: Pace WFL General Gait Details: Pt ambulated with a reciprocal gait pattern, even weight shift, equal arm swing, and good foot clearence. He navigated within the room and hallway without physical assistance, VC for sequencing, or LOB. Pt denied SOB/DOE and SpO2 >93% on RA. He demonstrated good safety awarness.  Home Activity Instructions Home Activity Instructions: Reviewed the importance of daily weight checks, which pt reported he performs with his scale at home. Encouraged pt to go on daily walks and complete B UE/LE exercises to maintain ROM/strength.  Stairs Stairs: Yes Stairs assistance: Modified independent (Device/Increase time) Stair Management: One rail Left, Forwards, Alternating pattern Number of Stairs: 18 General stair comments: Pt ascended/descended 2 flights of stairs without physical assitance or VC for sequencing. He did not require a rest break between levels and denied SOB/DOE.  Modified Rankin (Stroke Patients Only)        Balance Overall balance assessment: No apparent balance deficits (not formally assessed)                        Pertinent Vitals/Pain PT - Brief Vital Signs All Vital Signs Stable: Yes (Pt hypotensive but asymptomatic) Pain Assessment Pain Assessment: No/denies pain  Home Living Family/patient expects to be discharged to:: Group home Living Arrangements: Alone        Home Equipment: None   Additional Comments: Pt rents a room in a "rooming house". There are 7 steps to enter on the back of the house which is the entrance he usually uses. Pt shares a bathroom with other residents which is up a flight of stairs. Laundry is located in the basement down 6 steps. Pt reports there is a short half railing on the steps.    Prior Function Level of Independence: Independent Comments: Ambulates without an AD. One fall back in October d/t syncope. Pt reports checking his weight daily and using a pill box organizer. He states that it will take him more time to complete functional mobility and ADLs intermittently.    UE/LE Assessment   UE ROM/Strength/Tone/Coordination: WFL    LE ROM/Strength/Tone/Coordination: [facility]      Communication   Communication Communication: No apparent difficulties     Cognition Overall Cognitive Status: No family/caregiver present to determine baseline cognitive functioning       General Comments General comments (skin integrity, edema, etc.): At rest: BP 88/72 (80), HR 100, SpO2 100% on RA.    Exercises     Assessment/Plan    PT Problem List         PT Visit Diagnosis Other abnormalities of gait and mobility (R26.89)    No Skilled PT Patient is modified independent with all activity/mobility;Patient at baseline level of functioning;All education completed   Co-evaluation                AMPAC 6 Clicks Help needed turning from your back to your side while in a flat bed without using bedrails?: None Help needed moving from lying on your back to sitting on the side of a flat bed without using bedrails?: None Help needed moving to and from a bed to a chair (including a wheelchair)?: None Help needed standing up from a chair using your arms (e.g., wheelchair or bedside chair)?: None Help needed to walk in hospital room?: None Help needed climbing 3-5 steps with a railing? : None 6 Click Score: 24      End of  Session Equipment Utilized During Treatment: Gait belt Activity Tolerance: Patient tolerated treatment well Patient left: in bed;with call bell/phone within reach Nurse Communication: Mobility status PT Visit Diagnosis: Other abnormalities of gait and mobility (R26.89)     Time: 6045-4098 PT Time Calculation (min) (ACUTE ONLY): 26 min  Charges:   PT Evaluation $PT Eval Low Complexity: 1 Low PT Treatments $Gait Training: 8-22 mins    Cheri Guppy, PT, DPT Acute Rehabilitation Services Office: [phone] Secure Chat Preferred  Richardson Chiquito  03/21/2024, 11:03 AM

## 2024-03-21 NOTE — Evaluation (Signed)
 Occupational Therapy Evaluation and Discharge Patient Details Name: Ryan Long MRN: [id number] DOB: June 25, 1961 Today's Date: 03/21/2024   History of Present Illness   Charlton Boule is a 63 y.o. male admitted 03/17/24 with the working diagnosis of heart failure exacerbation. Pt presented with DOE, orthopnea, and LE edema. PMH of end stage heart failure, CKD, COPD, hypertension, hyperlipidemia, depression, anxiety, bipolar, and HIV. Of note, recent hospitalizations 2/5-2/21 and 3/1-3/21 for heart failure.     Clinical Impressions Pt is functioning at his baseline of modified independence in ADL and mobility. Educated in energy conservation strategies, pt receptive. Recommend a shower seat at home, pt agrees it would be helpful . Pt is returning home with hospice services, states he is tired of coming to the hospital so frequently. No further OT needs.      If plan is discharge home, recommend the following:   Direct supervision/assist for medications management     Functional Status Assessment   Patient has not had a recent decline in their functional status     Equipment Recommendations   Tub/shower seat     Recommendations for Other Services         Precautions/Restrictions   Precautions Recall of Precautions/Restrictions: Intact     Mobility Bed Mobility               General bed mobility comments: seated EOB    Transfers Overall transfer level: Independent Equipment used: None                      Balance                                           ADL either performed or assessed with clinical judgement   ADL Overall ADL's : At baseline;Modified independent                                     Functional mobility during ADLs: Independent General ADL Comments: recommended seated showering     Vision Baseline Vision/History: 1 Wears glasses Ability to See in Adequate Light: 0  Adequate Patient Visual Report: No change from baseline       Perception         Praxis         Pertinent Vitals/Pain Pain Assessment Pain Assessment: No/denies pain     Extremity/Trunk Assessment Upper Extremity Assessment Upper Extremity Assessment: Overall WFL for tasks assessed   Lower Extremity Assessment Lower Extremity Assessment: Defer to PT evaluation   Cervical / Trunk Assessment Cervical / Trunk Assessment: Normal   Communication Communication Communication: No apparent difficulties   Cognition Arousal: Alert Behavior During Therapy: WFL for tasks assessed/performed Cognition: No apparent impairments             OT - Cognition Comments: pt receptive to education in energy conservation strategies                 Following commands: Intact       Cueing  General Comments      At rest: BP 88/72 (80), HR 100, SpO2 100% on RA. Educate pt on energy conservation techniques amd taught him how to safely utilize a rollator.   Exercises     Shoulder Instructions      Home Living Family/patient  expects to be discharged to:: Private residence (boarding house) Living Arrangements: Alone   Type of Home: House Home Access: Stairs to enter [employer] of Steps: 7   Home Layout: Laundry or work area in basement;Two level;Able to live on main level with bedroom/bathroom     Bathroom Shower/Tub: Chief Strategy Officer: Standard     Home Equipment: None   Additional Comments: Pt rents a room in a "rooming house". There are 7 steps to enter on the back of the house which is the entrance he usually uses. Pt shares a bathroom with other residents which is up a flight of stairs. Laundry is located in the basement down 6 steps. Pt reports there is a short half railing on the steps.      Prior Functioning/Environment Prior Level of Function : Independent/Modified Independent;Driving               ADLs Comments: Drives a  "gas scooter" to get groceries    OT Problem List:     OT Treatment/Interventions:        OT Goals(Current goals can be found in the care plan section)       OT Frequency:       Co-evaluation              AM-PAC OT "6 Clicks" Daily Activity     Outcome Measure Help from another person eating meals?: None Help from another person taking care of personal grooming?: None Help from another person toileting, which includes using toliet, bedpan, or urinal?: None Help from another person bathing (including washing, rinsing, drying)?: None Help from another person to put on and taking off regular upper body clothing?: None Help from another person to put on and taking off regular lower body clothing?: None 6 Click Score: 24   End of Session    Activity Tolerance: Patient tolerated treatment well Patient left: in bed;with call bell/phone within reach  OT Visit Diagnosis: Other (comment) (decreased activity tolerance)                Time: 1610-9604 OT Time Calculation (min): 20 min Charges:  OT General Charges $OT Visit: 1 Visit OT Evaluation $OT Eval Low Complexity: 1 Low  Berna Spare, OTR/L Acute Rehabilitation Services Office: [phone]  Evern Bio 03/21/2024, 11:17 AM

## 2024-03-21 NOTE — Progress Notes (Addendum)
 Progress Note   Patient: Ryan Long ZOX:[id number] DOB: [birth year] DOA: 03/17/2024     3 DOS: the patient was seen and examined on 03/21/2024   Brief hospital course: Ryan Long was admitted to the hospital with the working diagnosis of heart failure exacerbation.   63 yo male with the past medical history of end stage heart failure, CKD, COPD, hypertension, hyperlipidemia, depression, anxiety, bipolar and HIV who presented with dyspnea.  Patient reported progressive dyspnea on exertion, to the point where he was having symptoms at rest. Positive orthopnea, lower extremity edema and PND.  Because advance heart disease he is currently enrolled in home hospice.  On his initial physical examination his blood pressure was 93/68, HR 80, RR 22 and 02 saturation 94%, lungs with no wheezing or rhonchi, heart with S1 and S2 present and regular with no gallops, abdomen with no distention and positive lower extremity edema.   Na 134, K 4,0 Cl 106 bicarbonate 18 glucose 123, BUN 19 cr 1,37  Mg 2.1  AST 46, ALT 53, ammonia 36 BNP 1,875  High sensitive troponin 76 and 75  Wbc 3,3 hgb 17.6 plt 141 Sars covid 19 negative Influenza negative  RSV negative   Urine analysis SG 1,013, protein 30, negative leukocytes and negative hgb   Chest radiograph with cardiomegaly and bilateral hilar vascular congestion with no infiltrates or effusions, pacemaker defibrillator in place,  CT abdomen and pelvis with negative for pulmonary embolism, clustered nodularity at the right lower lobe. Cardiomegaly with small pericardial effusion (increased from 2024), hepatic steatosis with subtle nodularity of the hepatic contour suggesting early cirrhosis.  Small volume abdominopelvic ascites,   EKG 100 bpm, normal axis, qtc 475, with normal intervals, sinus rhythm with right and left atrial enlargement, 1st degree AV block, with no significant ST segment or T wave changes. Poor R R wave progression.   Cardiology was  consulted with recommendations to continue palliative care. He is not candidate for advance heart failure therapies.   04/02 added analgesics. Patient would like to continue with home hospice services at the time of his discharge.  04/03 improved volume status, correcting electrolytes, possible discharge home tomorrow with home hospice services.  04/04 signs of hypervolemia, resumed on IV furosemide.   Assessment and Plan: Acute on chronic systolic CHF (congestive heart failure) (HCC) 2024 echocardiogram with reduced LV systolic function EF 20 to 25%, global hypokinesis, mild to moderate LV cavity dilatation, RV systolic function preserved, mild MR,   Urine output is 1600 ml Systolic blood pressure 90 mmHg range.   Will change to IV furosemide today 60 mg IV bid  Blood pressure support with midodrine. Limited pharmacologic therapy due to reduced GFR and risk of hypotension.  Poor prognosis, plan to resume hospice services at the time of discharge.  Continue with oxycodone and hydromorphone for pain control.   History of Ventricular tachycardia, continue with mexelitine. ICD therapies have been turned off.  Patient is DNR.    Atrial fibrillation (HCC) Continue rate control with amiodarone and mexelitine  Not on anticoagulation with DOAC. Continue with aspirin.   Hypertension Continue blood pressure support with midodrine.  Caution diuresis with oral torsemide.   Chronic kidney disease, stage 3a (HCC) Hyponatremia, hypomagnesemia and hypokalemia.   Renal function today with serum cr at 1,64 with K at 3,8 and serum bicarbonate at 22  Na 138 and Mg 2.2   Add 40 meq Kcl today Follow up renal function and electrolytes.  Resume IV furosemide  HIV (human immunodeficiency virus infection) (HCC) Continue with antiretroviral therapy.   BPH (benign prostatic hyperplasia) No signs of urinary retention   COPD (chronic obstructive pulmonary disease) (HCC) Continue 02 monitoring No  clinical signs of exacerbation   Hyperlipidemia Continue statin therapy   Bipolar 1 disorder (HCC) Clinically stable, currently off medical therapy.  Follow up as outpatient.         Subjective: Patient having nausea and ankle edema today, no chest pain, mild dyspnea   Physical Exam: Vitals:   03/21/24 1103 03/21/24 1140 03/21/24 1528 03/21/24 1529  BP:  99/77 (!) 80/65   Pulse: (!) 101 (!) 112    Resp: 16 20 20    Temp:  (!) 96.6 F (35.9 C) (!) 97.4 F (36.3 C)   TempSrc:  Axillary Oral   SpO2: 100% 100%  100%  Weight:      Height:       Neurology awake and alert ENT with mild pallor Cardiovascular with S1 and S2 present and regular with no gallops or rubs, positive systolic murmur at the apex Moderate JVD Positive bilateral ankle edema Respiratory with mild rales at bases with no wheezing or rhonchi  Abdomen with no distention  Data Reviewed:    Family Communication: no family at the bedside   Disposition: Status is: Inpatient Remains inpatient appropriate because: IV furosemide   Planned Discharge Destination: Home   Author: Coralie Keens, MD 03/21/2024 5:06 PM  For on call review [email or web address].

## 2024-03-21 NOTE — Plan of Care (Signed)
   Problem: Education: Goal: Knowledge of General Education information will improve Description Including pain rating scale, medication(s)/side effects and non-pharmacologic comfort measures Outcome: Progressing   Problem: Health Behavior/Discharge Planning: Goal: Ability to manage health-related needs will improve Outcome: Progressing

## 2024-03-22 ENCOUNTER — Other Ambulatory Visit (HOSPITAL_COMMUNITY): Payer: Self-pay

## 2024-03-22 DIAGNOSIS — M7918 Myalgia, other site: Secondary | ICD-10-CM

## 2024-03-22 DIAGNOSIS — N1831 Chronic kidney disease, stage 3a: Secondary | ICD-10-CM | POA: Diagnosis not present

## 2024-03-22 DIAGNOSIS — I10 Essential (primary) hypertension: Secondary | ICD-10-CM | POA: Diagnosis not present

## 2024-03-22 DIAGNOSIS — I4891 Unspecified atrial fibrillation: Secondary | ICD-10-CM | POA: Diagnosis not present

## 2024-03-22 DIAGNOSIS — I5023 Acute on chronic systolic (congestive) heart failure: Secondary | ICD-10-CM | POA: Diagnosis not present

## 2024-03-22 LAB — BASIC METABOLIC PANEL WITH GFR
Anion gap: 10 (ref 5–15)
BUN: 22 mg/dL (ref 8–23)
CO2: 24 mmol/L (ref 22–32)
Calcium: 8.1 mg/dL — ABNORMAL LOW (ref 8.9–10.3)
Chloride: 103 mmol/L (ref 98–111)
Creatinine, Ser: 1.6 mg/dL — ABNORMAL HIGH (ref 0.61–1.24)
GFR, Estimated: 48 mL/min — ABNORMAL LOW (ref >60)
Glucose, Bld: 108 mg/dL — ABNORMAL HIGH (ref 70–99)
Potassium: 3.4 mmol/L — ABNORMAL LOW (ref 3.5–5.1)
Sodium: 137 mmol/L (ref 135–145)

## 2024-03-22 LAB — MAGNESIUM: Magnesium: 1.8 mg/dL (ref 1.7–2.4)

## 2024-03-22 MED ORDER — TORSEMIDE 20 MG PO TABS
40.0000 mg | ORAL_TABLET | Freq: Every day | ORAL | Status: DC
  Administered 2024-03-22 – 2024-03-23 (×2): 40 mg via ORAL
  Filled 2024-03-22 (×2): qty 2

## 2024-03-22 MED ORDER — TORSEMIDE 20 MG PO TABS: 40.0000 mg | ORAL_TABLET | Freq: Every day | ORAL | Status: DC

## 2024-03-22 MED ORDER — OXYCODONE HCL 5 MG PO TABS
5.0000 mg | ORAL_TABLET | Freq: Four times a day (QID) | ORAL | 0 refills | Status: DC | PRN
  Filled 2024-03-22: qty 10, 3d supply, fill #0

## 2024-03-22 MED ORDER — MAGNESIUM SULFATE 2 GM/50ML IV SOLN
2.0000 g | Freq: Once | INTRAVENOUS | Status: AC
  Administered 2024-03-22: 2 g via INTRAVENOUS
  Filled 2024-03-22: qty 50

## 2024-03-22 MED ORDER — TORSEMIDE 20 MG PO TABS
40.0000 mg | ORAL_TABLET | Freq: Every day | ORAL | 0 refills | Status: DC
  Filled 2024-03-22: qty 90, 45d supply, fill #0

## 2024-03-22 MED ORDER — POTASSIUM CHLORIDE CRYS ER 20 MEQ PO TBCR
40.0000 meq | EXTENDED_RELEASE_TABLET | Freq: Once | ORAL | Status: AC
  Administered 2024-03-22: 40 meq via ORAL
  Filled 2024-03-22: qty 4

## 2024-03-22 NOTE — Assessment & Plan Note (Signed)
 Continue with as needed oxycodone.

## 2024-03-22 NOTE — Discharge Summary (Signed)
 Physician Discharge Summary   Patient: Ryan Long MRN: [id number] DOB: 1961-04-24  Admit date:     03/17/2024  Discharge date: 03/23/24  Discharge Physician: Ryan Long   PCP: Ryan Plum, MD   Recommendations at discharge:   Patient will continue diuresis with torsemide 40 mg daily and increase to 60 mg in case of volume overload, weight gain 2 to 3 lbs in 24 hrs or 5 lbs in 7 days.  Added oxycodone for pain control Follow up renal function and electrolytes as outpatient.  Follow up with hospice services.   Discharge Diagnoses: Active Problems:   Acute on chronic systolic CHF (congestive heart failure) (HCC)   Atrial fibrillation (HCC)   Hypertension   Chronic kidney disease, stage 3a (HCC)   HIV (human immunodeficiency virus infection) (HCC)   COPD (chronic obstructive pulmonary disease) (HCC)   BPH (benign prostatic hyperplasia)   Hyperlipidemia   Bipolar 1 disorder (HCC)  Resolved Problems:   * No resolved hospital problems. [facility] Course: Ryan Long was admitted to the hospital with the working diagnosis of heart failure exacerbation.   63 yo male with the past medical history of end stage heart failure, CKD, COPD, hypertension, hyperlipidemia, depression, anxiety, bipolar and HIV who presented with dyspnea.  Patient reported progressive dyspnea on exertion, to the point where he was having symptoms at rest. Positive orthopnea, lower extremity edema and PND.  Because advance heart disease he is currently enrolled in home hospice.  On his initial physical examination his blood pressure was 93/68, HR 80, RR 22 and 02 saturation 94%, lungs with no wheezing or rhonchi, heart with S1 and S2 present and regular with no gallops, abdomen with no distention and positive lower extremity edema.   Na 134, K 4,0 Cl 106 bicarbonate 18 glucose 123, BUN 19 cr 1,37  Mg 2.1  AST 46, ALT 53, ammonia 36 BNP 1,875  High sensitive troponin 76 and 75  Wbc  3,3 hgb 17.6 plt 141 Sars covid 19 negative Influenza negative  RSV negative   Urine analysis SG 1,013, protein 30, negative leukocytes and negative hgb   Chest radiograph with cardiomegaly and bilateral hilar vascular congestion with no infiltrates or effusions, pacemaker defibrillator in place,  CT abdomen and pelvis with negative for pulmonary embolism, clustered nodularity at the right lower lobe. Cardiomegaly with small pericardial effusion (increased from 2024), hepatic steatosis with subtle nodularity of the hepatic contour suggesting early cirrhosis.  Small volume abdominopelvic ascites,   EKG 100 bpm, normal axis, qtc 475, with normal intervals, sinus rhythm with right and left atrial enlargement, 1st degree AV block, with no significant ST segment or T wave changes. Poor R R wave progression.   Cardiology was consulted with recommendations to continue palliative care. He is not candidate for advance heart failure therapies.   04/02 added analgesics. Patient would like to continue with home hospice services at the time of his discharge.  04/03 improved volume status, correcting electrolytes, possible discharge home tomorrow with home hospice services.  04/04 signs of hypervolemia, resumed on IV furosemide.  04/05 volume status has improved, plan for discharge home and follow up with hospice services.   Assessment and Plan: Acute on chronic systolic CHF (congestive heart failure) (HCC) 2024 echocardiogram with reduced LV systolic function EF 20 to 25%, global hypokinesis, mild to moderate LV cavity dilatation, RV systolic function preserved, mild MR,   Patient was placed on IV furosemide for diuresis, negative fluid balance was achieved, -  3,928 ml, with significant improvement in his symptoms.   Patient will continue with torsemide 40 mg daily and take extra 20 mg in case of volume overload.  Blood pressure support with midodrine. Limited pharmacologic therapy due to reduced GFR  and risk of hypotension.  Poor prognosis, plan to resume hospice services at the time of discharge.  Continue with oxycodone for pain control   History of Ventricular tachycardia, continue with mexelitine. ICD therapies have been turned off.  Patient is DNR.    Atrial fibrillation (HCC) Continue rate control with amiodarone and mexelitine  Not on anticoagulation with DOAC. Continue with aspirin.   Hypertension Continue blood pressure support with midodrine.  Caution diuresis with oral torsemide.   Chronic kidney disease, stage 3a (HCC) Hyponatremia, hypomagnesemia and hypokalemia.   At the time of his discharge his renal function had a serum cr of 1,45 with K at 3,2 and serum bicarbonate at 25  Na 138 and Mg 2,1   Patient will get 40 meq Kcl po X2 prior to his discharge.  Follow up renal function and electrolytes as outpatient.   HIV (human immunodeficiency virus infection) (HCC) Continue with antiretroviral therapy.   BPH (benign prostatic hyperplasia) No signs of urinary retention   COPD (chronic obstructive pulmonary disease) (HCC) No clinical signs of exacerbation  Continue with bronchodilator therapy   Hyperlipidemia Continue statin therapy   Bipolar 1 disorder (HCC) Clinically stable, currently off medical therapy.  Follow up as outpatient.   Musculoskeletal pain Continue with as needed oxycodone.    Consultants: cardiology  Procedures performed: none   Disposition: Home Diet recommendation:  Cardiac diet DISCHARGE MEDICATION: Allergies as of 03/22/2024   No Known Allergies      Medication List     STOP taking these medications    furosemide 20 MG tablet Commonly known as: LASIX       TAKE these medications    acetaminophen 325 MG tablet Commonly known as: TYLENOL Take 2 tablets (650 mg total) by mouth every 6 (six) hours as needed for mild pain (pain score 1-3) (or Fever >/= 101).   albuterol 108 (90 Base) MCG/ACT inhaler Commonly known  as: VENTOLIN HFA Inhale 2 puffs into the lungs every 6 (six) hours as needed for wheezing or shortness of breath.   amiodarone 200 MG tablet Commonly known as: Pacerone Take 1 tablet (200 mg total) by mouth daily.   aspirin EC 81 MG tablet Take 81 mg by mouth in the morning. Swallow whole.   Biktarvy 50-200-25 MG Tabs tablet Generic drug: bictegravir-emtricitabine-tenofovir AF Take 1 tablet by mouth daily. What changed: when to take this   dronabinol 5 MG capsule Commonly known as: MARINOL Take 1 capsule (5 mg total) by mouth 2 (two) times daily before lunch and supper.   finasteride 5 MG tablet Commonly known as: PROSCAR TAKE 1 TABLET(5 MG) BY MOUTH DAILY What changed: See the new instructions.   Melatonin 10 MG Tabs Take 10 mg by mouth at bedtime.   mexiletine 250 MG capsule Commonly known as: MEXITIL Take 1 capsule (250 mg total) by mouth every 12 (twelve) hours.   midodrine 5 MG tablet Commonly known as: PROAMATINE Take 3 tablets (15 mg total) by mouth 3 (three) times daily.   ondansetron 4 MG tablet Commonly known as: Zofran Take 1 tablet (4 mg total) by mouth every 8 (eight) hours as needed for nausea or vomiting.   oxyCODONE 5 MG immediate release tablet Commonly known as: Oxy IR/ROXICODONE Take  1 tablet (5 mg total) by mouth every 6 (six) hours as needed for severe pain (pain score 7-10).   polyethylene glycol 17 g packet Commonly known as: MIRALAX / GLYCOLAX Take 17 g by mouth daily.   Symbicort 80-4.5 MCG/ACT inhaler Generic drug: budesonide-formoterol Inhale 2 puffs into the lungs in the morning and at bedtime.   tamsulosin 0.4 MG Caps capsule Commonly known as: FLOMAX TAKE 2 CAPSULES(0.8 MG) BY MOUTH DAILY AFTER SUPPER What changed:  how much to take how to take this when to take this additional instructions   torsemide 20 MG tablet Commonly known as: DEMADEX Take 2 tablets (40 mg total) by mouth daily. In case of weight gain 2 to 3 lbs in 24  hrs or 5 lbs in 7 days, take 3 tablets daily until weight back to baseline Start taking on: March 23, 2024   triamcinolone ointment 0.5 % Commonly known as: KENALOG Apply 1 Application topically 2 (two) times daily.   valACYclovir 500 MG tablet Commonly known as: VALTREX Take 1 tablet (500 mg total) by mouth daily. What changed: when to take this        Follow-up Information     Ryan Plum, MD. Go on 03/25/2024.   Specialty: Internal Medicine Why: @12 :30pm Contact information: [id number] [place] [address] [phone]                Discharge Exam: Filed Weights   03/20/24 0434 03/21/24 0501 03/22/24 0529  Weight: 74.5 kg 80.3 kg 75 kg   Neurology awake and alert ENT with mild pallor with no icterus  Cardiovascular with S1 and S2 present and regular with no gallops, rubs, positive systolic murmur at the apex No JVD No lower extremity edema Respiratory with no rales or wheezing, no rhonchi.  Abdomen with no distention  No lower extremity edema   Condition at discharge: stable  The results of significant diagnostics from this hospitalization (including imaging, microbiology, ancillary and laboratory) are listed below for reference.   Imaging Studies: US Abdomen Limited RUQ (LIVER/GB) Result Date: 03/18/2024 CLINICAL DATA:  Transaminitis. EXAM: ULTRASOUND ABDOMEN LIMITED RIGHT UPPER QUADRANT COMPARISON:  CT earlier 03/18/2024 FINDINGS: Gallbladder: Distended gallbladder with borderline wall thickening. There is dependent sludge. No shadowing stones. Common bile duct: Diameter: 6 mm, upper limits of normal. Liver: No focal lesion identified. Within normal limits in parenchymal echogenicity. Portal vein is patent on color Doppler imaging with normal direction of blood flow towards the liver. Other: None. IMPRESSION: Distended gallbladder with some sludge. No shadowing stones. Borderline wall thickness. No common duct. Electronically Signed    By: Karen Kays M.D.   On: 03/18/2024 13:15   CT Angio Chest Pulmonary Embolism (PE) W or WO Contrast Result Date: 03/18/2024 CLINICAL DATA:  Nausea and shortness of breath, vomiting EXAM: CT ANGIOGRAPHY CHEST CT ABDOMEN AND PELVIS WITH CONTRAST TECHNIQUE: Multidetector CT imaging of the chest was performed using the standard protocol during bolus administration of intravenous contrast. Multiplanar CT image reconstructions and MIPs were obtained to evaluate the vascular anatomy. Multidetector CT imaging of the abdomen and pelvis was performed using the standard protocol during bolus administration of intravenous contrast. RADIATION DOSE REDUCTION: This exam was performed according to the departmental dose-optimization program which includes automated exposure control, adjustment of the mA and/or kV according to patient size and/or use of iterative reconstruction technique. CONTRAST:  75mL OMNIPAQUE IOHEXOL 350 MG/ML SOLN COMPARISON:  Chest radiograph 03/18/2024 and CTA chest 12/04/2023; CT abdomen pelvis  02/04/2024 FINDINGS: CTA CHEST FINDINGS Cardiovascular: Cardiomegaly. Small pericardial effusion, increased from 12/04/2023. Normal caliber aorta. Aortic atherosclerotic calcification. Negative for pulmonary embolism. Left chest wall pacemaker. Mediastinum/Nodes: Trachea and esophagus are unremarkable. No thoracic adenopathy. Lungs/Pleura: Mild centrilobular emphysema in the upper lobes. Clustered nodularity in the right lower lobe compatible with small airway infection/inflammation. No pleural effusion or pneumothorax. Musculoskeletal: No acute fracture. Review of the MIP images confirms the above findings. CT ABDOMEN and PELVIS FINDINGS Hepatobiliary: Hepatic steatosis. Subtle nodularity of the hepatic contour suggesting early cirrhosis. Sludge or vicarious excretion of contrast in the gallbladder. No biliary dilation. Pancreas: Fatty atrophy.  No acute abnormality. Spleen: Unremarkable. Adrenals/Urinary Tract:  Normal adrenal glands. Bilateral cortical renal scarring. No urinary calculi or hydronephrosis. Unremarkable bladder. Stomach/Bowel: Colonic diverticulosis. Trace stranding about the proximal sigmoid colon suggesting mild diverticulitis. Normal appendix. Stomach is within normal limits. Vascular/Lymphatic: Aortic atherosclerosis. No enlarged abdominal or pelvic lymph nodes. Reproductive: Enlarged prostate indenting the floor of the bladder. Other: Small volume abdominopelvic ascites. No free intraperitoneal air. Musculoskeletal: No acute fracture. Review of the MIP images confirms the above findings. IMPRESSION: 1. Negative for pulmonary embolism. 2. Clustered nodularity in the right lower lobe compatible with small airway infection/inflammation. 3. Cardiomegaly. Small pericardial effusion, increased from 12/04/2023. 4. Hepatic steatosis with subtle nodularity of the hepatic contour suggesting early cirrhosis. 5. Small volume abdominopelvic ascites. 6. Mild proximal sigmoid diverticulitis. Electronically Signed   By: Minerva Fester M.D.   On: 03/18/2024 03:14   CT ABDOMEN PELVIS W CONTRAST Result Date: 03/18/2024 CLINICAL DATA:  Nausea and shortness of breath, vomiting EXAM: CT ANGIOGRAPHY CHEST CT ABDOMEN AND PELVIS WITH CONTRAST TECHNIQUE: Multidetector CT imaging of the chest was performed using the standard protocol during bolus administration of intravenous contrast. Multiplanar CT image reconstructions and MIPs were obtained to evaluate the vascular anatomy. Multidetector CT imaging of the abdomen and pelvis was performed using the standard protocol during bolus administration of intravenous contrast. RADIATION DOSE REDUCTION: This exam was performed according to the departmental dose-optimization program which includes automated exposure control, adjustment of the mA and/or kV according to patient size and/or use of iterative reconstruction technique. CONTRAST:  75mL OMNIPAQUE IOHEXOL 350 MG/ML SOLN  COMPARISON:  Chest radiograph 03/18/2024 and CTA chest 12/04/2023; CT abdomen pelvis 02/04/2024 FINDINGS: CTA CHEST FINDINGS Cardiovascular: Cardiomegaly. Small pericardial effusion, increased from 12/04/2023. Normal caliber aorta. Aortic atherosclerotic calcification. Negative for pulmonary embolism. Left chest wall pacemaker. Mediastinum/Nodes: Trachea and esophagus are unremarkable. No thoracic adenopathy. Lungs/Pleura: Mild centrilobular emphysema in the upper lobes. Clustered nodularity in the right lower lobe compatible with small airway infection/inflammation. No pleural effusion or pneumothorax. Musculoskeletal: No acute fracture. Review of the MIP images confirms the above findings. CT ABDOMEN and PELVIS FINDINGS Hepatobiliary: Hepatic steatosis. Subtle nodularity of the hepatic contour suggesting early cirrhosis. Sludge or vicarious excretion of contrast in the gallbladder. No biliary dilation. Pancreas: Fatty atrophy.  No acute abnormality. Spleen: Unremarkable. Adrenals/Urinary Tract: Normal adrenal glands. Bilateral cortical renal scarring. No urinary calculi or hydronephrosis. Unremarkable bladder. Stomach/Bowel: Colonic diverticulosis. Trace stranding about the proximal sigmoid colon suggesting mild diverticulitis. Normal appendix. Stomach is within normal limits. Vascular/Lymphatic: Aortic atherosclerosis. No enlarged abdominal or pelvic lymph nodes. Reproductive: Enlarged prostate indenting the floor of the bladder. Other: Small volume abdominopelvic ascites. No free intraperitoneal air. Musculoskeletal: No acute fracture. Review of the MIP images confirms the above findings. IMPRESSION: 1. Negative for pulmonary embolism. 2. Clustered nodularity in the right lower lobe compatible with small airway infection/inflammation. 3. Cardiomegaly.  Small pericardial effusion, increased from 12/04/2023. 4. Hepatic steatosis with subtle nodularity of the hepatic contour suggesting early cirrhosis. 5. Small  volume abdominopelvic ascites. 6. Mild proximal sigmoid diverticulitis. Electronically Signed   By: Minerva Fester M.D.   On: 03/18/2024 03:14   DG Chest Port 1 View Result Date: 03/18/2024 CLINICAL DATA:  Chest pain EXAM: PORTABLE CHEST 1 VIEW COMPARISON:  02/22/2024 FINDINGS: Cardiomegaly. Left AICD remains in place, unchanged. No confluent opacities, effusions or edema. No acute bony abnormality. IMPRESSION: No active disease. Electronically Signed   By: Charlett Nose M.D.   On: 03/18/2024 00:13   DG Chest 1 View Result Date: 02/22/2024 CLINICAL DATA:  Pulmonary edema. Shortness of breath and chest pain. EXAM: CHEST  1 VIEW COMPARISON:  02/16/2024. FINDINGS: The heart is enlarged and the mediastinal contour is within normal limits. A single lead pacemaker device is present over the left chest. There is a questionable trace right pleural effusion. Mild atelectasis is noted at the lung bases. No pneumothorax is seen. No acute osseous abnormality. IMPRESSION: 1. Questionable trace right pleural effusion with atelectasis at the lung bases. 2. Cardiomegaly. Electronically Signed   By: Thornell Sartorius M.D.   On: 02/22/2024 22:13    Microbiology: Results for orders placed or performed during the hospital encounter of 03/17/24  Resp panel by RT-PCR (RSV, Flu A&B, Covid) Anterior Nasal Swab     Status: None   Collection Time: 03/17/24 11:57 PM   Specimen: Anterior Nasal Swab  Result Value Ref Range Status   SARS Coronavirus 2 by RT PCR NEGATIVE NEGATIVE Final   Influenza A by PCR NEGATIVE NEGATIVE Final   Influenza B by PCR NEGATIVE NEGATIVE Final    Comment: (NOTE) The Xpert Xpress SARS-CoV-2/FLU/RSV plus assay is intended as an aid in the diagnosis of influenza from Nasopharyngeal swab specimens and should not be used as a sole basis for treatment. Nasal washings and aspirates are unacceptable for Xpert Xpress SARS-CoV-2/FLU/RSV testing.  Fact Sheet for  Patients: BloggerCourse.com  Fact Sheet for Healthcare Providers: SeriousBroker.it  This test is not yet approved or cleared by the [place] FDA and has been authorized for detection and/or diagnosis of SARS-CoV-2 by FDA under an Emergency Use Authorization (EUA). This EUA will remain in effect (meaning this test can be used) for the duration of the COVID-19 declaration under Section 564(b)(1) of the Act, 21 U.S.C. section 360bbb-3(b)(1), unless the authorization is terminated or revoked.     Resp Syncytial Virus by PCR NEGATIVE NEGATIVE Final    Comment: (NOTE) Fact Sheet for Patients: BloggerCourse.com  Fact Sheet for Healthcare Providers: SeriousBroker.it  This test is not yet approved or cleared by the [place] FDA and has been authorized for detection and/or diagnosis of SARS-CoV-2 by FDA under an Emergency Use Authorization (EUA). This EUA will remain in effect (meaning this test can be used) for the duration of the COVID-19 declaration under Section 564(b)(1) of the Act, 21 U.S.C. section 360bbb-3(b)(1), unless the authorization is terminated or revoked.  Performed at [facility], [address], [place], [place] [address]     Labs: CBC: Recent Labs  Lab 03/17/24 2358  WBC 3.3*  NEUTROABS 1.7  HGB 17.6*  HCT 49.6  MCV 95.4  PLT 141*   Basic Metabolic Panel: Recent Labs  Lab 03/17/24 2358 03/19/24 0301 03/20/24 0303 03/21/24 0344 03/22/24 0301  NA 134* 138 138 138 137  K 4.0 2.9* 3.1* 3.8 3.4*  CL 106 104 104 106 103  CO2 18* 25 24 22 24   GLUCOSE 123* 96 110* 137* 108*  BUN 18 18 18 20 22   CREATININE 1.37* 1.63* 1.47* 1.64* 1.60*  CALCIUM 8.0* 8.1* 8.1* 8.1* 8.1*  MG 2.1  --  1.6* 2.2 1.8   Liver Function Tests: Recent Labs  Lab 03/17/24 2358  AST 46*  ALT 53*  ALKPHOS 99  BILITOT 2.8*  PROT 5.3*  ALBUMIN 2.9*   CBG: No  results for input(s): "GLUCAP" in the last 168 hours.  Discharge time spent: greater than 30 minutes.  Signed: Coralie Keens, MD [facility] 03/22/2024

## 2024-03-22 NOTE — Plan of Care (Signed)
  Problem: Health Behavior/Discharge Planning: Goal: Ability to manage health-related needs will improve Outcome: Progressing   Problem: Clinical Measurements: Goal: Will remain free from infection Outcome: Progressing Goal: Respiratory complications will improve Outcome: Progressing   Problem: Activity: Goal: Risk for activity intolerance will decrease Outcome: Progressing   Problem: Nutrition: Goal: Adequate nutrition will be maintained Outcome: Not Progressing Note: Poor PO intake.

## 2024-03-23 DIAGNOSIS — I5023 Acute on chronic systolic (congestive) heart failure: Secondary | ICD-10-CM | POA: Diagnosis not present

## 2024-03-23 LAB — BASIC METABOLIC PANEL WITH GFR
Anion gap: 10 (ref 5–15)
BUN: 20 mg/dL (ref 8–23)
CO2: 25 mmol/L (ref 22–32)
Calcium: 8.3 mg/dL — ABNORMAL LOW (ref 8.9–10.3)
Chloride: 103 mmol/L (ref 98–111)
Creatinine, Ser: 1.45 mg/dL — ABNORMAL HIGH (ref 0.61–1.24)
GFR, Estimated: 54 mL/min — ABNORMAL LOW (ref >60)
Glucose, Bld: 106 mg/dL — ABNORMAL HIGH (ref 70–99)
Potassium: 3.2 mmol/L — ABNORMAL LOW (ref 3.5–5.1)
Sodium: 138 mmol/L (ref 135–145)

## 2024-03-23 LAB — MAGNESIUM: Magnesium: 2.1 mg/dL (ref 1.7–2.4)

## 2024-03-23 MED ORDER — POTASSIUM CHLORIDE CRYS ER 20 MEQ PO TBCR
40.0000 meq | EXTENDED_RELEASE_TABLET | ORAL | Status: DC
  Administered 2024-03-23: 40 meq via ORAL
  Filled 2024-03-23: qty 2

## 2024-03-23 NOTE — Care Management (Signed)
 Notified by MD likely DC this weekend with home hospice. Referral made earlier in the week to [facility].  Attempted to reach out to liaison yesterday, unacknowledged, reached out again this morning, and also called- left a VM.

## 2024-03-23 NOTE — TOC Transition Note (Addendum)
 Transition of Care [facility]) - Discharge Note   Patient Details  Name: Ryan Long MRN: [id number] Date of Birth: March 26, 1961  Transition of Care [facility]) CM/SW Contact:  Lawerance Sabal, RN Phone Number: 03/23/2024, 9:31 AM   Clinical Narrative:     Could not reach [facility] liaison. Called office number [phone] to notify of DC. They were aware of referral, and answering service will have a local [facility] rep call me back.  Spoke w patient, flowsheet shows patient is on RA, he states he is oxygen, reached out to MD to see if he needs home oxygen.  DR Arrien confirms patient does not need home oxygen.  He has confirmed his address and will go home by cab.   10:14 Received acknowledgement from Dahlia Client, liaison w [facility], that patient will go home today.     Barriers to Discharge: Continued Medical Work up   Patient Goals and CMS Choice Patient states their goals for this hospitalization and ongoing recovery are:: return to boarding house          Discharge Placement                       Discharge Plan and Services Additional resources added to the After Visit Summary for     Discharge Planning Services: CM Consult Post Acute Care Choice: NA          DME Arranged: N/A DME Agency: NA       HH Arranged: NA          Social Drivers of Health (SDOH) Interventions SDOH Screenings   Food Insecurity: No Food Insecurity (03/18/2024)  Housing: Low Risk  (03/18/2024)  Transportation Needs: No Transportation Needs (03/18/2024)  Utilities: Not At Risk (03/18/2024)  Alcohol Screen: Low Risk  (07/17/2023)  Depression (PHQ2-9): Low Risk  (08/01/2023)  Financial Resource Strain: Low Risk  (07/17/2023)  Physical Activity: Insufficiently Active (07/17/2023)  Social Connections: Moderately Integrated (03/18/2024)  Stress: Stress Concern Present (07/17/2023)  Tobacco Use: High Risk (03/17/2024)  Health Literacy: Adequate Health Literacy (07/18/2023)     Readmission Risk  Interventions    03/19/2024    3:54 PM  Readmission Risk Prevention Plan  Transportation Screening Complete  PCP or Specialist Appt within 3-5 Days Complete  HRI or Home Care Consult Complete  Medication Review (RN Care Manager) Complete

## 2024-03-23 NOTE — Plan of Care (Signed)
  Problem: Education: Goal: Knowledge of General Education information will improve Description: Including pain rating scale, medication(s)/side effects and non-pharmacologic comfort measures Outcome: Progressing   Problem: Clinical Measurements: Goal: Diagnostic test results will improve Outcome: Progressing Goal: Respiratory complications will improve Outcome: Progressing Goal: Cardiovascular complication will be avoided Outcome: Progressing   Problem: Clinical Measurements: Goal: Respiratory complications will improve Outcome: Progressing   Problem: Clinical Measurements: Goal: Cardiovascular complication will be avoided Outcome: Progressing   Problem: Activity: Goal: Risk for activity intolerance will decrease Outcome: Progressing

## 2024-03-23 NOTE — Progress Notes (Signed)
 Patient is feeling better, no dyspnea and edema has resolved. No PND or orthopnea   BP (!) 88/70 (BP Location: Right Arm)   Pulse 96   Temp 97.7 F (36.5 C) (Oral)   Resp 16   Ht 5\' 8"  (1.727 m)   Wt 75 kg   SpO2 100%   BMI 25.14 kg/m   Neurology awake and alert ENT with no pallor Cardiovascular with S1 and S2 present and regular with no gallops or rubs, positive systolic murmur at the right lower sternal border No JVD No lower extremity edema Respiratory with no rales or wheezing, no rhonchi  Abdomen with no distention   Acute on chronic systolic heart failure.  Plan to continue oral torsemide and follow up with hospice services as outpatient.  Medications per [facility] pharmacy at discharge.

## 2024-03-23 NOTE — Progress Notes (Signed)
 Reviewed AVS, patient expressed understanding of medications, MD follow up reviewed.   Removed IV, Site clean, dry and intact.  Patient states all belongings brought to the hospital at time of admission are accounted for and packed to take home.  Pt transported to Discharge lounge to wait for transportation home.  Informed patient he would need to pick up medications from [facility] pharmacy when they open on Monday.

## 2024-03-25 DIAGNOSIS — J4489 Other specified chronic obstructive pulmonary disease: Secondary | ICD-10-CM | POA: Diagnosis not present

## 2024-03-25 DIAGNOSIS — N1831 Chronic kidney disease, stage 3a: Secondary | ICD-10-CM | POA: Diagnosis not present

## 2024-03-25 DIAGNOSIS — I5022 Chronic systolic (congestive) heart failure: Secondary | ICD-10-CM | POA: Diagnosis not present

## 2024-03-25 DIAGNOSIS — I482 Chronic atrial fibrillation, unspecified: Secondary | ICD-10-CM | POA: Diagnosis not present

## 2024-03-25 DIAGNOSIS — I110 Hypertensive heart disease with heart failure: Secondary | ICD-10-CM | POA: Diagnosis not present

## 2024-03-25 DIAGNOSIS — R7401 Elevation of levels of liver transaminase levels: Secondary | ICD-10-CM | POA: Diagnosis not present

## 2024-03-25 DIAGNOSIS — R809 Proteinuria, unspecified: Secondary | ICD-10-CM | POA: Diagnosis not present

## 2024-03-25 DIAGNOSIS — E782 Mixed hyperlipidemia: Secondary | ICD-10-CM | POA: Diagnosis not present

## 2024-03-25 DIAGNOSIS — R7303 Prediabetes: Secondary | ICD-10-CM | POA: Diagnosis not present

## 2024-03-25 DIAGNOSIS — Z79899 Other long term (current) drug therapy: Secondary | ICD-10-CM | POA: Diagnosis not present

## 2024-03-26 ENCOUNTER — Other Ambulatory Visit: Payer: Self-pay | Admitting: Cardiology

## 2024-03-26 ENCOUNTER — Other Ambulatory Visit: Payer: Self-pay

## 2024-03-26 ENCOUNTER — Encounter (HOSPITAL_COMMUNITY): Payer: Self-pay | Admitting: *Deleted

## 2024-03-26 ENCOUNTER — Emergency Department (HOSPITAL_COMMUNITY)

## 2024-03-26 ENCOUNTER — Emergency Department (HOSPITAL_COMMUNITY)
Admission: EM | Admit: 2024-03-26 | Discharge: 2024-03-26 | Disposition: A | Discharge status: Home / Self Care | Attending: Emergency Medicine | Admitting: Emergency Medicine

## 2024-03-26 DIAGNOSIS — Z7982 Long term (current) use of aspirin: Secondary | ICD-10-CM | POA: Diagnosis not present

## 2024-03-26 DIAGNOSIS — R404 Transient alteration of awareness: Secondary | ICD-10-CM | POA: Diagnosis not present

## 2024-03-26 DIAGNOSIS — R42 Dizziness and giddiness: Secondary | ICD-10-CM | POA: Diagnosis present

## 2024-03-26 DIAGNOSIS — I472 Ventricular tachycardia, unspecified: Secondary | ICD-10-CM | POA: Diagnosis not present

## 2024-03-26 DIAGNOSIS — M25512 Pain in left shoulder: Secondary | ICD-10-CM | POA: Diagnosis not present

## 2024-03-26 DIAGNOSIS — R079 Chest pain, unspecified: Secondary | ICD-10-CM | POA: Diagnosis not present

## 2024-03-26 DIAGNOSIS — I517 Cardiomegaly: Secondary | ICD-10-CM | POA: Diagnosis not present

## 2024-03-26 DIAGNOSIS — I4729 Other ventricular tachycardia: Secondary | ICD-10-CM

## 2024-03-26 LAB — BASIC METABOLIC PANEL WITH GFR
Anion gap: 12 (ref 5–15)
BUN: 26 mg/dL — ABNORMAL HIGH (ref 8–23)
CO2: 27 mmol/L (ref 22–32)
Calcium: 8.4 mg/dL — ABNORMAL LOW (ref 8.9–10.3)
Chloride: 99 mmol/L (ref 98–111)
Creatinine, Ser: 1.59 mg/dL — ABNORMAL HIGH (ref 0.61–1.24)
GFR, Estimated: 49 mL/min — ABNORMAL LOW (ref >60)
Glucose, Bld: 98 mg/dL (ref 70–99)
Potassium: 3.1 mmol/L — ABNORMAL LOW (ref 3.5–5.1)
Sodium: 138 mmol/L (ref 135–145)

## 2024-03-26 LAB — CBC
HCT: 48.5 % (ref 39.0–52.0)
Hemoglobin: 17.3 g/dL — ABNORMAL HIGH (ref 13.0–17.0)
MCH: 33.7 pg (ref 26.0–34.0)
MCHC: 35.7 g/dL (ref 30.0–36.0)
MCV: 94.5 fL (ref 80.0–100.0)
Platelets: 167 10*3/uL (ref 150–400)
RBC: 5.13 MIL/uL (ref 4.22–5.81)
RDW: 14.9 % (ref 11.5–15.5)
WBC: 2.6 10*3/uL — ABNORMAL LOW (ref 4.0–10.5)
nRBC: 0 % (ref 0.0–0.2)

## 2024-03-26 LAB — TROPONIN I (HIGH SENSITIVITY)
Troponin I (High Sensitivity): 24 ng/L — ABNORMAL HIGH (ref <18)
Troponin I (High Sensitivity): 23 ng/L — ABNORMAL HIGH (ref <18)

## 2024-03-26 MED ORDER — OXYCODONE HCL 5 MG PO TABS
5.0000 mg | ORAL_TABLET | Freq: Once | ORAL | Status: AC
  Administered 2024-03-26: 5 mg via ORAL
  Filled 2024-03-26: qty 1

## 2024-03-26 MED ORDER — MAGNESIUM OXIDE -MG SUPPLEMENT 400 (240 MG) MG PO TABS
800.0000 mg | ORAL_TABLET | Freq: Once | ORAL | Status: AC
  Administered 2024-03-26: 800 mg via ORAL
  Filled 2024-03-26: qty 2

## 2024-03-26 MED ORDER — DIAZEPAM 5 MG PO TABS
5.0000 mg | ORAL_TABLET | Freq: Once | ORAL | Status: AC
  Administered 2024-03-26: 5 mg via ORAL
  Filled 2024-03-26: qty 1

## 2024-03-26 MED ORDER — POTASSIUM CHLORIDE 20 MEQ PO PACK
60.0000 meq | PACK | Freq: Two times a day (BID) | ORAL | Status: DC
  Administered 2024-03-26: 60 meq via ORAL
  Filled 2024-03-26: qty 3

## 2024-03-26 NOTE — ED Triage Notes (Signed)
 Arrived from home with PTAR for flutters, dizziness, l shoulder pain,  ICD. Defibrillator turned off in February. Just released  from admission. Bp 98/0, pulse 51, cbg 116

## 2024-03-26 NOTE — ED Provider Notes (Signed)
 Patient requesting discharge.  Social work is coordinating with hospice for facilitation of care.   Gerhard Munch, MD 03/26/24 1003

## 2024-03-26 NOTE — ED Notes (Signed)
Pt wanting to leave. MD notified.  

## 2024-03-26 NOTE — Discharge Instructions (Signed)
 Efforts have been made to ensure that our hospice colleagues will contact you today or tomorrow.  Please be sure to follow-up with them and with your cardiology team.

## 2024-03-26 NOTE — ED Notes (Signed)
 Pt very anxious and waiting at door. RN notified MD. Pt states he is ready to go

## 2024-03-26 NOTE — Progress Notes (Signed)
 Transition of Care [facility]) - Emergency Department Mini Assessment   Patient Details  Name: Ryan Long MRN: [id number] Date of Birth: 23-Mar-1961  Transition of Care [facility]) CM/SW Contact:    Oletta Cohn, RN Phone Number: 03/26/2024, 8:50 AM   Clinical Narrative: RNCM consulted regarding pt not having visit from home hospice staff ([facility]).  RNCM reached out to liaison, Dahlia Client regarding this issue.  Dahlia Client states she was informed by intake RN that pt refused visit on multiple occasions, but they are willing to attempt once more.    RNCM spoke with pt at bedside who states he has NOT had visit from company although he has called them multiple times; and with his last call, the voice mailbox was full and he was unable to leave a message.    RNCM returned call to [facility] with information from pt.  Dahlia Client will investigate and call back with a solution.     ED Mini Assessment: What brought you to the Emergency Department? : (P) Patient said he felt like his heart was fluttering and he is having some pain in his left shoulder and felt a little bit lightheaded  Barriers to Discharge: (P) Barriers Resolved     Means of departure: (P) Car  Interventions which prevented an admission or readmission: Hospice    Patient Contact and Communications        ,          Patient states their goals for this hospitalization and ongoing recovery are:: (P) return home with hospice      Admission diagnosis:  cp Patient Active Problem List   Diagnosis Date Noted   Musculoskeletal pain 03/22/2024   Acute on chronic heart failure (HCC) 03/18/2024   Chronic kidney disease, stage 3a (HCC) 02/22/2024   Acute on chronic HFrEF (heart failure with reduced ejection fraction) (HCC) 02/17/2024   Chest pain 02/17/2024   Ventricular tachycardia (HCC) 01/23/2024   Acute on chronic systolic CHF (congestive heart failure) (HCC) 01/03/2024   Asthmatic bronchitis 01/24/2023   Rash and nonspecific  skin eruption 12/19/2022   Arm numbness 11/15/2022   Aortic atherosclerosis (HCC) 11/15/2022   Chronic kidney disease 08/08/2022   Goals of care, counseling/discussion 08/08/2022   Cigarette smoker 01/26/2022   ETD (Eustachian tube dysfunction), right 01/26/2022   Allergy to pollen 01/26/2022   Polycythemia 12/08/2021   Vitamin D deficiency 12/08/2021   Hip pain 01/06/2021   VT (ventricular tachycardia) (HCC) 08/25/2020   HIV positive (HCC) 07/12/2020   Atrial fibrillation (HCC) 06/10/2020   BPH (benign prostatic hyperplasia) 01/28/2020   HFrEF (heart failure with reduced ejection fraction) (HCC) 01/28/2020   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   Achilles tendinitis, left leg 04/03/2019   AICD (automatic cardioverter/defibrillator) present 08/15/2018   Abnormal EKG 06/28/2018   Adjustment disorder with mixed disturbance of emotions and conduct    NICM (nonischemic cardiomyopathy) (HCC) 01/08/2018   Anxiety 12/20/2017   Bipolar 1 disorder (HCC) 10/10/2017   Chronic systolic heart failure (HCC) 05/13/2017   HSV-2 infection 07/17/2016   History of attempted suicide 04/14/2016   History of alcoholism (HCC) 04/14/2016   History of substance abuse (HCC) 04/14/2016   Constipation 01/17/2016   COPD (chronic obstructive pulmonary disease) (HCC) 10/04/2015   Abnormal thyroid stimulating hormone (TSH) level 10/04/2015   MDD (major depressive disorder), recurrent episode, severe (HCC) 09/23/2015   Hypertension 09/10/2015   PTSD (post-traumatic stress disorder) 08/07/2015   Primary insomnia 04/02/2015   Herpesviral infection of penis 04/02/2015   Depression with  suicidal ideation 07/03/2014   Generalized anxiety disorder 05/29/2014   Tobacco use disorder 02/17/2013   Heart failure (HCC) 09/19/2011   Other primary cardiomyopathies 08/24/2011   HIV (human immunodeficiency virus infection) (HCC) 02/01/2009   Recurrent HSV (herpes simplex virus) 02/01/2009   Hyperlipidemia 02/01/2009    Allergic rhinitis 02/01/2009   PCP:  Jackie Plum, MD Pharmacy:   [facility] [phone] Ginette Otto, [place] - [address] AT [facility] [address] [place] [address] Phone: [phone] Fax: [phone]  [name] [address] [place] [address] Phone: [phone] Fax: [phone]

## 2024-03-26 NOTE — Discharge Planning (Signed)
 RNCM consulted regarding pt not being able to contact Home Hospice.  RNCM left message for Ryan Long Client [phone]) to call me.  Ekam Bonebrake J. Lucretia Roers, RN, BSN, NCM  Transitions of Care  Nurse Case Manager  [facility] Emergency Departments  Operative Services  [phone]

## 2024-03-26 NOTE — ED Provider Notes (Signed)
 [place] EMERGENCY DEPARTMENT AT [facility] Provider Note   CSN: [id number] Arrival date & time: 03/26/24  0403     History  Chief Complaint  Patient presents with   Dizziness    Ryan Long is a 63 y.o. male.  63 yo M with a chief complaint of not feeling well.  Patient said he felt like his heart was fluttering and he is having some pain in his left shoulder and felt a little bit lightheaded.  He was recently in the hospital and was discharged home with plans to establish with home hospice however says that he has not received any contact from them and has tried to get in contact with them multiple times throughout the past few days.  He woke up from a nap and felt like he felt worse and decided to come in for evaluation.   Dizziness      Home Medications Prior to Admission medications   Medication Sig Start Date End Date Taking? Authorizing Provider  acetaminophen (TYLENOL) 325 MG tablet Take 2 tablets (650 mg total) by mouth every 6 (six) hours as needed for mild pain (pain score 1-3) (or Fever >/= 101). 03/07/24   Alwyn Ren, MD  albuterol (VENTOLIN HFA) 108 (90 Base) MCG/ACT inhaler Inhale 2 puffs into the lungs every 6 (six) hours as needed for wheezing or shortness of breath. 07/30/23   Storm Frisk, MD  amiodarone (PACERONE) 200 MG tablet Take 1 tablet (200 mg total) by mouth daily. 03/09/24   Jonita Albee, PA-C  aspirin EC 81 MG tablet Take 81 mg by mouth in the morning. Swallow whole.    [provider]  bictegravir-emtricitabine-tenofovir AF (BIKTARVY) 50-200-25 MG TABS tablet Take 1 tablet by mouth daily. Patient taking differently: Take 1 tablet by mouth in the morning. 08/15/23   Randall Hiss, MD  dronabinol (MARINOL) 5 MG capsule Take 1 capsule (5 mg total) by mouth 2 (two) times daily before lunch and supper. 02/09/24   Jonita Albee, PA-C  finasteride (PROSCAR) 5 MG tablet TAKE 1 TABLET(5 MG) BY MOUTH  DAILY Patient taking differently: Take 5 mg by mouth in the morning. 03/15/23   Storm Frisk, MD  Melatonin 10 MG TABS Take 10 mg by mouth at bedtime.    [provider]  mexiletine (MEXITIL) 250 MG capsule Take 1 capsule (250 mg total) by mouth every 12 (twelve) hours. 02/08/24   Jonita Albee, PA-C  midodrine (PROAMATINE) 5 MG tablet Take 3 tablets (15 mg total) by mouth 3 (three) times daily. 03/07/24   Alwyn Ren, MD  ondansetron (ZOFRAN) 4 MG tablet Take 1 tablet (4 mg total) by mouth every 8 (eight) hours as needed for nausea or vomiting. 03/12/24   Camnitz, Andree Coss, MD  oxyCODONE (OXY IR/ROXICODONE) 5 MG immediate release tablet Take 1 tablet (5 mg total) by mouth every 6 (six) hours as needed for severe pain (pain score 7-10). 03/22/24   Arrien, York Ram, MD  polyethylene glycol (MIRALAX / GLYCOLAX) 17 g packet Take 17 g by mouth daily. 02/09/24   Jonita Albee, PA-C  SYMBICORT 80-4.5 MCG/ACT inhaler Inhale 2 puffs into the lungs in the morning and at bedtime. 03/06/24   [provider]  tamsulosin (FLOMAX) 0.4 MG CAPS capsule TAKE 2 CAPSULES(0.8 MG) BY MOUTH DAILY AFTER SUPPER Patient taking differently: Take 0.4 mg by mouth 2 (two) times daily. 07/26/23   Storm Frisk, MD  torsemide (DEMADEX) 20 MG tablet Take 2 tablets (40 mg total) by mouth daily. In case of weight gain 2 to 3 lbs in 24 hrs or 5 lbs in 7 days, take 3 tablets daily until weight back to baseline 03/23/24 05/06/24  Arrien, York Ram, MD  triamcinolone ointment (KENALOG) 0.5 % Apply 1 Application topically 2 (two) times daily. 08/15/23   Storm Frisk, MD  valACYclovir (VALTREX) 500 MG tablet Take 1 tablet (500 mg total) by mouth daily. Patient taking differently: Take 500 mg by mouth in the morning. 11/15/22   Storm Frisk, MD      Allergies    Patient has no known allergies.    Review of Systems   Review of Systems  Neurological:  Positive for dizziness.     Physical Exam Updated Vital Signs BP 98/79   Pulse 84   Temp 97.9 F (36.6 C) (Oral)   Resp 19   SpO2 100%  Physical Exam Vitals and nursing note reviewed.  Constitutional:      Appearance: He is well-developed.  HENT:     Head: Normocephalic and atraumatic.  Eyes:     Pupils: Pupils are equal, round, and reactive to light.  Neck:     Vascular: No JVD.  Cardiovascular:     Rate and Rhythm: Normal rate and regular rhythm.     Heart sounds: No murmur heard.    No friction rub. No gallop.  Pulmonary:     Effort: No respiratory distress.     Breath sounds: No wheezing.  Abdominal:     General: There is no distension.     Tenderness: There is no abdominal tenderness. There is no guarding or rebound.  Musculoskeletal:        General: Normal range of motion.     Cervical back: Normal range of motion and neck supple.  Skin:    Coloration: Skin is not pale.     Findings: No rash.  Neurological:     Mental Status: He is alert and oriented to person, place, and time.  Psychiatric:        Behavior: Behavior normal.     ED Results / Procedures / Treatments   Labs (all labs ordered are listed, but only abnormal results are displayed) Labs Reviewed  BASIC METABOLIC PANEL WITH GFR - Abnormal; Notable for the following components:      Result Value   Potassium 3.1 (*)    BUN 26 (*)    Creatinine, Ser 1.59 (*)    Calcium 8.4 (*)    GFR, Estimated 49 (*)    All other components within normal limits  CBC - Abnormal; Notable for the following components:   WBC 2.6 (*)    Hemoglobin 17.3 (*)    All other components within normal limits  TROPONIN I (HIGH SENSITIVITY) - Abnormal; Notable for the following components:   Troponin I (High Sensitivity) 24 (*)    All other components within normal limits  TROPONIN I (HIGH SENSITIVITY)    EKG EKG Interpretation Date/Time:  Wednesday March 26 2024 04:21:33 EDT Ventricular Rate:  92 PR Interval:  240 QRS Duration:  112 QT  Interval:  386 QTC Calculation: 477 R Axis:   81  Text Interpretation: Undetermined rhythm Septal infarct , age undetermined Lateral infarct , age undetermined Possible Inferior infarct , age undetermined Abnormal ECG No significant change since last tracing Confirmed by Melene Plan [phone]) on 03/26/2024 6:14:34 AM  Radiology DG Chest 2 View Result  Date: 03/26/2024 CLINICAL DATA:  Dizziness and chest pain EXAM: CHEST - 2 VIEW COMPARISON:  03/18/2025 FINDINGS: There is a left chest wall ICD with lead in the right ventricle. Stable cardiac enlargement. No pleural fluid, interstitial edema or airspace consolidation. Spondylosis noted within the thoracic spine, mild. IMPRESSION: 1. No acute findings. 2. Stable cardiac enlargement. Electronically Signed   By: Signa Kell M.D.   On: 03/26/2024 06:44    Procedures Procedures    Medications Ordered in ED Medications  potassium chloride (KLOR-CON) packet 60 mEq (has no administration in time range)  oxyCODONE (Oxy IR/ROXICODONE) immediate release tablet 5 mg (5 mg Oral Given 03/26/24 0607)  diazepam (VALIUM) tablet 5 mg (5 mg Oral Given 03/26/24 0606)  magnesium oxide (MAG-OX) tablet 800 mg (800 mg Oral Given 03/26/24 8119)    ED Course/ Medical Decision Making/ A&P                                 Medical Decision Making Amount and/or Complexity of Data Reviewed Labs: ordered. Radiology: ordered.  Risk OTC drugs. Prescription drug management.   63 yo M with a chief complaints of not feeling well.  He said that this is been going on for about 12 hours or so.  Has episodes where he feels like he has some chest discomforts and then feels his heart flutters.  On my record review the patient has a known EF of 10 to 20%, he did not like that his ICD was shocking him frequently and so had it turned off a couple months ago.  Had plans to go home with home hospice was just in prolonged hospitalization and was discharged about 3 days ago.  Patient was  fluid overloaded on his last admission.  Appears little bit on the dry side for me.  No concerning lung sounds.  Appears to be breathing comfortably.  Troponin mildly elevated at 24.  No significant change in renal function.  Mild leukopenia.  Not sure that he would benefit from repeat ablation.  Will try to treat his symptoms here.  Social work contacted to see if they can help him establish with home hospice.  The patients results and plan were reviewed and discussed.   Any x-rays performed were independently reviewed by myself.   Differential diagnosis were considered with the presenting HPI.  Medications  potassium chloride (KLOR-CON) packet 60 mEq (has no administration in time range)  oxyCODONE (Oxy IR/ROXICODONE) immediate release tablet 5 mg (5 mg Oral Given 03/26/24 0607)  diazepam (VALIUM) tablet 5 mg (5 mg Oral Given 03/26/24 0606)  magnesium oxide (MAG-OX) tablet 800 mg (800 mg Oral Given 03/26/24 0627)    Vitals:   03/26/24 0406 03/26/24 0557  BP: 95/73 98/79  Pulse: 95 84  Resp: 18 19  Temp: 97.9 F (36.6 C)   TempSrc: Oral   SpO2: 100% 100%    Final diagnoses:  Non-sustained ventricular tachycardia (HCC)           Final Clinical Impression(s) / ED Diagnoses Final diagnoses:  Non-sustained ventricular tachycardia [facility])    Rx / DC Orders ED Discharge Orders     None         Melene Plan, DO 03/26/24 1478

## 2024-03-27 ENCOUNTER — Telehealth: Payer: Self-pay | Admitting: Pharmacy Technician

## 2024-03-27 NOTE — Telephone Encounter (Signed)
 Got another request to do a prior authorization. But this was denied due to him being in hospice and per last ov he still is.

## 2024-04-09 DIAGNOSIS — Z0000 Encounter for general adult medical examination without abnormal findings: Secondary | ICD-10-CM | POA: Diagnosis not present

## 2024-04-09 DIAGNOSIS — I110 Hypertensive heart disease with heart failure: Secondary | ICD-10-CM | POA: Diagnosis not present

## 2024-04-09 DIAGNOSIS — K5909 Other constipation: Secondary | ICD-10-CM | POA: Diagnosis not present

## 2024-04-09 DIAGNOSIS — I5022 Chronic systolic (congestive) heart failure: Secondary | ICD-10-CM | POA: Diagnosis not present

## 2024-04-14 ENCOUNTER — Other Ambulatory Visit: Payer: Self-pay

## 2024-04-14 ENCOUNTER — Other Ambulatory Visit

## 2024-04-14 DIAGNOSIS — B20 Human immunodeficiency virus [HIV] disease: Secondary | ICD-10-CM

## 2024-04-14 DIAGNOSIS — Z79899 Other long term (current) drug therapy: Secondary | ICD-10-CM | POA: Diagnosis not present

## 2024-04-14 DIAGNOSIS — Z113 Encounter for screening for infections with a predominantly sexual mode of transmission: Secondary | ICD-10-CM

## 2024-04-15 ENCOUNTER — Telehealth: Payer: Self-pay

## 2024-04-15 LAB — C. TRACHOMATIS/N. GONORRHOEAE RNA
C. trachomatis RNA, TMA: NOT DETECTED
N. gonorrhoeae RNA, TMA: NOT DETECTED

## 2024-04-15 NOTE — Telephone Encounter (Signed)
-----   Message from Ryan Long sent at 04/15/2024  9:49 AM EDT ----- Ptatients potassium trending low does he have a PCP ----- Message ----- From: Addison Holster Lab Results In Sent: 04/14/2024  11:10 PM EDT To: Charolette Copier, MD

## 2024-04-15 NOTE — Telephone Encounter (Signed)
 Left voicemail asking patient to return my call.   Yvonne Stopher Lesli Albee, CMA

## 2024-04-15 NOTE — Telephone Encounter (Signed)
Patient aware and will contact PCP.

## 2024-04-16 LAB — LIPID PANEL
Cholesterol: 191 mg/dL (ref <200)
HDL: 71 mg/dL (ref >=40)
LDL Cholesterol (Calc): 101 mg/dL — ABNORMAL HIGH
Non-HDL Cholesterol (Calc): 120 mg/dL (ref <130)
Total CHOL/HDL Ratio: 2.7 (calc) (ref <5.0)
Triglycerides: 97 mg/dL (ref <150)

## 2024-04-16 LAB — CBC WITH DIFFERENTIAL/PLATELET
Absolute Lymphocytes: 955 {cells}/uL (ref 850–3900)
Absolute Monocytes: 213 {cells}/uL (ref 200–950)
Basophils Absolute: 20 {cells}/uL (ref 0–200)
Basophils Relative: 0.8 %
Eosinophils Absolute: 10 {cells}/uL — ABNORMAL LOW (ref 15–500)
Eosinophils Relative: 0.4 %
HCT: 54.2 % — ABNORMAL HIGH (ref 38.5–50.0)
Hemoglobin: 18.5 g/dL — ABNORMAL HIGH (ref 13.2–17.1)
MCH: 33.1 pg — ABNORMAL HIGH (ref 27.0–33.0)
MCHC: 34.1 g/dL (ref 32.0–36.0)
MCV: 97 fL (ref 80.0–100.0)
MPV: 10.7 fL (ref 7.5–12.5)
Monocytes Relative: 8.5 %
Neutro Abs: 1303 {cells}/uL — ABNORMAL LOW (ref 1500–7800)
Neutrophils Relative %: 52.1 %
Platelets: 151 10*3/uL (ref 140–400)
RBC: 5.59 10*6/uL (ref 4.20–5.80)
RDW: 15.5 % — ABNORMAL HIGH (ref 11.0–15.0)
Total Lymphocyte: 38.2 %
WBC: 2.5 10*3/uL — ABNORMAL LOW (ref 3.8–10.8)

## 2024-04-16 LAB — COMPLETE METABOLIC PANEL WITHOUT GFR
AG Ratio: 1.6 (calc) (ref 1.0–2.5)
ALT: 26 U/L (ref 9–46)
AST: 29 U/L (ref 10–35)
Albumin: 4 g/dL (ref 3.6–5.1)
Alkaline phosphatase (APISO): 73 U/L (ref 35–144)
BUN/Creatinine Ratio: 11 (calc) (ref 6–22)
BUN: 23 mg/dL (ref 7–25)
CO2: 36 mmol/L — ABNORMAL HIGH (ref 20–32)
Calcium: 9.1 mg/dL (ref 8.6–10.3)
Chloride: 93 mmol/L — ABNORMAL LOW (ref 98–110)
Creat: 2.15 mg/dL — ABNORMAL HIGH (ref 0.70–1.35)
Globulin: 2.5 g/dL (ref 1.9–3.7)
Glucose, Bld: 111 mg/dL — ABNORMAL HIGH (ref 65–99)
Potassium: 2.9 mmol/L — ABNORMAL LOW (ref 3.5–5.3)
Sodium: 141 mmol/L (ref 135–146)
Total Bilirubin: 1.9 mg/dL — ABNORMAL HIGH (ref 0.2–1.2)
Total Protein: 6.5 g/dL (ref 6.1–8.1)

## 2024-04-16 LAB — T-HELPER CELLS (CD4) COUNT (NOT AT ARMC)
Absolute CD4: 429 {cells}/uL — ABNORMAL LOW (ref 490–1740)
CD4 T Helper %: 43 % (ref 30–61)
Total lymphocyte count: 1003 {cells}/uL (ref 850–3900)

## 2024-04-16 LAB — RPR: RPR Ser Ql: NONREACTIVE

## 2024-04-16 LAB — HIV-1 RNA QUANT-NO REFLEX-BLD
HIV 1 RNA Quant: NOT DETECTED {copies}/mL
HIV-1 RNA Quant, Log: NOT DETECTED {Log_copies}/mL

## 2024-04-19 ENCOUNTER — Emergency Department (HOSPITAL_COMMUNITY)

## 2024-04-19 ENCOUNTER — Inpatient Hospital Stay (HOSPITAL_COMMUNITY)
Admission: EM | Admit: 2024-04-19 | Discharge: 2024-05-09 | DRG: 291 | Disposition: A | Discharge status: Skilled Nursing Facility | Attending: Internal Medicine | Admitting: Internal Medicine

## 2024-04-19 ENCOUNTER — Other Ambulatory Visit: Payer: Self-pay

## 2024-04-19 ENCOUNTER — Encounter (HOSPITAL_COMMUNITY): Payer: Self-pay

## 2024-04-19 DIAGNOSIS — E440 Moderate protein-calorie malnutrition: Secondary | ICD-10-CM | POA: Diagnosis not present

## 2024-04-19 DIAGNOSIS — J449 Chronic obstructive pulmonary disease, unspecified: Secondary | ICD-10-CM | POA: Diagnosis not present

## 2024-04-19 DIAGNOSIS — Z66 Do not resuscitate: Secondary | ICD-10-CM | POA: Diagnosis not present

## 2024-04-19 DIAGNOSIS — F419 Anxiety disorder, unspecified: Secondary | ICD-10-CM | POA: Diagnosis present

## 2024-04-19 DIAGNOSIS — Z21 Asymptomatic human immunodeficiency virus [HIV] infection status: Secondary | ICD-10-CM | POA: Diagnosis not present

## 2024-04-19 DIAGNOSIS — B20 Human immunodeficiency virus [HIV] disease: Secondary | ICD-10-CM | POA: Diagnosis present

## 2024-04-19 DIAGNOSIS — I472 Ventricular tachycardia, unspecified: Secondary | ICD-10-CM | POA: Diagnosis not present

## 2024-04-19 DIAGNOSIS — R5381 Other malaise: Secondary | ICD-10-CM

## 2024-04-19 DIAGNOSIS — I5084 End stage heart failure: Secondary | ICD-10-CM | POA: Diagnosis not present

## 2024-04-19 DIAGNOSIS — Z8249 Family history of ischemic heart disease and other diseases of the circulatory system: Secondary | ICD-10-CM | POA: Diagnosis not present

## 2024-04-19 DIAGNOSIS — I480 Paroxysmal atrial fibrillation: Secondary | ICD-10-CM | POA: Diagnosis not present

## 2024-04-19 DIAGNOSIS — I428 Other cardiomyopathies: Secondary | ICD-10-CM | POA: Diagnosis not present

## 2024-04-19 DIAGNOSIS — I499 Cardiac arrhythmia, unspecified: Secondary | ICD-10-CM | POA: Diagnosis not present

## 2024-04-19 DIAGNOSIS — Z95810 Presence of automatic (implantable) cardiac defibrillator: Secondary | ICD-10-CM

## 2024-04-19 DIAGNOSIS — Z6823 Body mass index (BMI) 23.0-23.9, adult: Secondary | ICD-10-CM

## 2024-04-19 DIAGNOSIS — I959 Hypotension, unspecified: Secondary | ICD-10-CM | POA: Diagnosis not present

## 2024-04-19 DIAGNOSIS — Z7189 Other specified counseling: Secondary | ICD-10-CM | POA: Diagnosis not present

## 2024-04-19 DIAGNOSIS — I5043 Acute on chronic combined systolic (congestive) and diastolic (congestive) heart failure: Secondary | ICD-10-CM | POA: Diagnosis present

## 2024-04-19 DIAGNOSIS — E785 Hyperlipidemia, unspecified: Secondary | ICD-10-CM | POA: Diagnosis present

## 2024-04-19 DIAGNOSIS — N1831 Chronic kidney disease, stage 3a: Secondary | ICD-10-CM

## 2024-04-19 DIAGNOSIS — F4310 Post-traumatic stress disorder, unspecified: Secondary | ICD-10-CM | POA: Diagnosis present

## 2024-04-19 DIAGNOSIS — R079 Chest pain, unspecified: Principal | ICD-10-CM

## 2024-04-19 DIAGNOSIS — I5022 Chronic systolic (congestive) heart failure: Secondary | ICD-10-CM

## 2024-04-19 DIAGNOSIS — F319 Bipolar disorder, unspecified: Secondary | ICD-10-CM | POA: Diagnosis present

## 2024-04-19 DIAGNOSIS — I517 Cardiomegaly: Secondary | ICD-10-CM | POA: Diagnosis not present

## 2024-04-19 DIAGNOSIS — A6002 Herpesviral infection of other male genital organs: Secondary | ICD-10-CM | POA: Diagnosis present

## 2024-04-19 DIAGNOSIS — J4489 Other specified chronic obstructive pulmonary disease: Secondary | ICD-10-CM | POA: Diagnosis not present

## 2024-04-19 DIAGNOSIS — R404 Transient alteration of awareness: Secondary | ICD-10-CM | POA: Diagnosis not present

## 2024-04-19 DIAGNOSIS — Z79899 Other long term (current) drug therapy: Secondary | ICD-10-CM | POA: Diagnosis not present

## 2024-04-19 DIAGNOSIS — Z818 Family history of other mental and behavioral disorders: Secondary | ICD-10-CM

## 2024-04-19 DIAGNOSIS — E876 Hypokalemia: Secondary | ICD-10-CM | POA: Diagnosis not present

## 2024-04-19 DIAGNOSIS — I509 Heart failure, unspecified: Secondary | ICD-10-CM

## 2024-04-19 DIAGNOSIS — R3911 Hesitancy of micturition: Secondary | ICD-10-CM | POA: Diagnosis not present

## 2024-04-19 DIAGNOSIS — Z751 Person awaiting admission to adequate facility elsewhere: Secondary | ICD-10-CM

## 2024-04-19 DIAGNOSIS — Z515 Encounter for palliative care: Secondary | ICD-10-CM | POA: Diagnosis not present

## 2024-04-19 DIAGNOSIS — F17210 Nicotine dependence, cigarettes, uncomplicated: Secondary | ICD-10-CM | POA: Diagnosis not present

## 2024-04-19 DIAGNOSIS — I130 Hypertensive heart and chronic kidney disease with heart failure and stage 1 through stage 4 chronic kidney disease, or unspecified chronic kidney disease: Secondary | ICD-10-CM | POA: Diagnosis not present

## 2024-04-19 DIAGNOSIS — N401 Enlarged prostate with lower urinary tract symptoms: Secondary | ICD-10-CM

## 2024-04-19 DIAGNOSIS — R0789 Other chest pain: Secondary | ICD-10-CM | POA: Diagnosis not present

## 2024-04-19 DIAGNOSIS — Z7951 Long term (current) use of inhaled steroids: Secondary | ICD-10-CM

## 2024-04-19 DIAGNOSIS — N400 Enlarged prostate without lower urinary tract symptoms: Secondary | ICD-10-CM | POA: Diagnosis present

## 2024-04-19 DIAGNOSIS — Z604 Social exclusion and rejection: Secondary | ICD-10-CM | POA: Diagnosis present

## 2024-04-19 LAB — CBC
HCT: 46.9 % (ref 39.0–52.0)
Hemoglobin: 16.3 g/dL (ref 13.0–17.0)
MCH: 33.5 pg (ref 26.0–34.0)
MCHC: 34.8 g/dL (ref 30.0–36.0)
MCV: 96.3 fL (ref 80.0–100.0)
Platelets: 144 10*3/uL — ABNORMAL LOW (ref 150–400)
RBC: 4.87 MIL/uL (ref 4.22–5.81)
RDW: 14.6 % (ref 11.5–15.5)
WBC: 2.8 10*3/uL — ABNORMAL LOW (ref 4.0–10.5)
nRBC: 0 % (ref 0.0–0.2)

## 2024-04-19 LAB — RAPID URINE DRUG SCREEN, HOSP PERFORMED
Amphetamines: NOT DETECTED
Barbiturates: NOT DETECTED
Benzodiazepines: NOT DETECTED
Cocaine: NOT DETECTED
Opiates: NOT DETECTED
Tetrahydrocannabinol: NOT DETECTED

## 2024-04-19 LAB — BASIC METABOLIC PANEL WITH GFR
Anion gap: 11 (ref 5–15)
BUN: 19 mg/dL (ref 8–23)
CO2: 32 mmol/L (ref 22–32)
Calcium: 8 mg/dL — ABNORMAL LOW (ref 8.9–10.3)
Chloride: 97 mmol/L — ABNORMAL LOW (ref 98–111)
Creatinine, Ser: 1.55 mg/dL — ABNORMAL HIGH (ref 0.61–1.24)
GFR, Estimated: 50 mL/min — ABNORMAL LOW (ref >60)
Glucose, Bld: 128 mg/dL — ABNORMAL HIGH (ref 70–99)
Potassium: 2.4 mmol/L — CL (ref 3.5–5.1)
Sodium: 140 mmol/L (ref 135–145)

## 2024-04-19 LAB — BRAIN NATRIURETIC PEPTIDE: B Natriuretic Peptide: 1490 pg/mL — ABNORMAL HIGH (ref 0.0–100.0)

## 2024-04-19 LAB — TROPONIN I (HIGH SENSITIVITY)
Troponin I (High Sensitivity): 12 ng/L (ref <18)
Troponin I (High Sensitivity): 16 ng/L (ref <18)

## 2024-04-19 MED ORDER — AMIODARONE HCL 200 MG PO TABS
200.0000 mg | ORAL_TABLET | Freq: Every day | ORAL | Status: DC
  Administered 2024-04-20 – 2024-05-09 (×20): 200 mg via ORAL
  Filled 2024-04-19 (×20): qty 1

## 2024-04-19 MED ORDER — SODIUM CHLORIDE 0.9% FLUSH
3.0000 mL | Freq: Two times a day (BID) | INTRAVENOUS | Status: DC
  Administered 2024-04-19 – 2024-05-09 (×36): 3 mL via INTRAVENOUS

## 2024-04-19 MED ORDER — POTASSIUM CHLORIDE 10 MEQ/100ML IV SOLN
10.0000 meq | INTRAVENOUS | Status: AC
  Administered 2024-04-19 (×4): 10 meq via INTRAVENOUS
  Filled 2024-04-19 (×3): qty 100

## 2024-04-19 MED ORDER — ENOXAPARIN SODIUM 40 MG/0.4ML IJ SOSY
40.0000 mg | PREFILLED_SYRINGE | INTRAMUSCULAR | Status: DC
  Administered 2024-04-19 – 2024-05-08 (×20): 40 mg via SUBCUTANEOUS
  Filled 2024-04-19 (×21): qty 0.4

## 2024-04-19 MED ORDER — MELATONIN 5 MG PO TABS
10.0000 mg | ORAL_TABLET | Freq: Every day | ORAL | Status: DC
  Administered 2024-04-19 – 2024-04-29 (×11): 10 mg via ORAL
  Filled 2024-04-19 (×18): qty 2

## 2024-04-19 MED ORDER — FINASTERIDE 5 MG PO TABS
5.0000 mg | ORAL_TABLET | Freq: Every day | ORAL | Status: DC
  Administered 2024-04-20 – 2024-05-09 (×20): 5 mg via ORAL
  Filled 2024-04-19 (×20): qty 1

## 2024-04-19 MED ORDER — ONDANSETRON HCL 4 MG/2ML IJ SOLN: 4.0000 mg | Freq: Once | INTRAMUSCULAR | Status: DC

## 2024-04-19 MED ORDER — OXYCODONE HCL 5 MG PO TABS
5.0000 mg | ORAL_TABLET | Freq: Four times a day (QID) | ORAL | Status: DC | PRN
  Administered 2024-04-20: 5 mg via ORAL
  Filled 2024-04-19: qty 1

## 2024-04-19 MED ORDER — CALCIUM GLUCONATE-NACL 2-0.675 GM/100ML-% IV SOLN
2.0000 g | Freq: Once | INTRAVENOUS | Status: AC
Start: 2024-04-19 — End: 2024-04-19
  Administered 2024-04-19: 2000 mg via INTRAVENOUS
  Filled 2024-04-19: qty 100

## 2024-04-19 MED ORDER — MIDODRINE HCL 5 MG PO TABS: 30.0000 mg | ORAL_TABLET | Freq: Three times a day (TID) | ORAL | Status: DC

## 2024-04-19 MED ORDER — ASPIRIN 81 MG PO TBEC
81.0000 mg | DELAYED_RELEASE_TABLET | Freq: Every morning | ORAL | Status: DC
  Administered 2024-04-20 – 2024-05-09 (×20): 81 mg via ORAL
  Filled 2024-04-19 (×20): qty 1

## 2024-04-19 MED ORDER — ACETAMINOPHEN 325 MG PO TABS
650.0000 mg | ORAL_TABLET | Freq: Four times a day (QID) | ORAL | Status: DC | PRN
  Administered 2024-04-20 – 2024-05-09 (×48): 650 mg via ORAL
  Filled 2024-04-19 (×48): qty 2

## 2024-04-19 MED ORDER — POTASSIUM CHLORIDE 20 MEQ PO PACK
60.0000 meq | PACK | Freq: Once | ORAL | Status: AC
  Administered 2024-04-19: 60 meq via ORAL
  Filled 2024-04-19: qty 3

## 2024-04-19 MED ORDER — TRIMETHOBENZAMIDE HCL 100 MG/ML IM SOLN: 200.0000 mg | Freq: Four times a day (QID) | INTRAMUSCULAR | Status: DC | PRN

## 2024-04-19 MED ORDER — MORPHINE SULFATE (PF) 4 MG/ML IV SOLN
4.0000 mg | Freq: Once | INTRAVENOUS | Status: AC
  Administered 2024-04-19: 4 mg via INTRAVENOUS
  Filled 2024-04-19: qty 1

## 2024-04-19 MED ORDER — ONDANSETRON HCL 4 MG/2ML IJ SOLN
4.0000 mg | Freq: Four times a day (QID) | INTRAMUSCULAR | Status: DC | PRN
  Administered 2024-04-19 – 2024-05-08 (×7): 4 mg via INTRAVENOUS
  Filled 2024-04-19 (×8): qty 2

## 2024-04-19 MED ORDER — TAMSULOSIN HCL 0.4 MG PO CAPS
0.4000 mg | ORAL_CAPSULE | Freq: Two times a day (BID) | ORAL | Status: DC
  Administered 2024-04-19 – 2024-04-20 (×2): 0.4 mg via ORAL
  Filled 2024-04-19 (×2): qty 1

## 2024-04-19 MED ORDER — MOMETASONE FURO-FORMOTEROL FUM 100-5 MCG/ACT IN AERO
2.0000 | INHALATION_SPRAY | Freq: Two times a day (BID) | RESPIRATORY_TRACT | Status: DC
  Administered 2024-04-19 – 2024-05-09 (×39): 2 via RESPIRATORY_TRACT
  Filled 2024-04-19 (×2): qty 8.8

## 2024-04-19 MED ORDER — MAGNESIUM SULFATE 2 GM/50ML IV SOLN
2.0000 g | Freq: Once | INTRAVENOUS | Status: AC
  Administered 2024-04-19: 2 g via INTRAVENOUS
  Filled 2024-04-19: qty 50

## 2024-04-19 MED ORDER — MIDODRINE HCL 5 MG PO TABS
15.0000 mg | ORAL_TABLET | Freq: Three times a day (TID) | ORAL | Status: DC
  Filled 2024-04-19: qty 3

## 2024-04-19 MED ORDER — BICTEGRAVIR-EMTRICITAB-TENOFOV 50-200-25 MG PO TABS
1.0000 | ORAL_TABLET | Freq: Every day | ORAL | Status: DC
  Administered 2024-04-20 – 2024-05-09 (×20): 1 via ORAL
  Filled 2024-04-19 (×20): qty 1

## 2024-04-19 MED ORDER — ENSURE ENLIVE PO LIQD
237.0000 mL | Freq: Two times a day (BID) | ORAL | Status: DC
Start: 2024-04-20 — End: 2024-05-09
  Administered 2024-04-21 – 2024-05-09 (×28): 237 mL via ORAL

## 2024-04-19 MED ORDER — VALACYCLOVIR HCL 500 MG PO TABS
500.0000 mg | ORAL_TABLET | Freq: Every day | ORAL | Status: DC
  Administered 2024-04-20 – 2024-05-09 (×20): 500 mg via ORAL
  Filled 2024-04-19 (×20): qty 1

## 2024-04-19 MED ORDER — MIDODRINE HCL 5 MG PO TABS
30.0000 mg | ORAL_TABLET | Freq: Three times a day (TID) | ORAL | Status: DC
  Administered 2024-04-19 – 2024-05-09 (×60): 30 mg via ORAL
  Filled 2024-04-19 (×8): qty 6
  Filled 2024-04-19: qty 1
  Filled 2024-04-19 (×54): qty 6

## 2024-04-19 MED ORDER — ACETAMINOPHEN 650 MG RE SUPP: 650.0000 mg | Freq: Four times a day (QID) | RECTAL | Status: DC | PRN

## 2024-04-19 MED ORDER — MEXILETINE HCL 250 MG PO CAPS
250.0000 mg | ORAL_CAPSULE | Freq: Two times a day (BID) | ORAL | Status: DC
  Administered 2024-04-19 – 2024-05-09 (×40): 250 mg via ORAL
  Filled 2024-04-19 (×41): qty 1

## 2024-04-19 MED ORDER — TORSEMIDE 20 MG PO TABS
40.0000 mg | ORAL_TABLET | Freq: Every day | ORAL | Status: DC
  Administered 2024-04-20: 40 mg via ORAL
  Filled 2024-04-19 (×2): qty 2

## 2024-04-19 MED ORDER — ALBUTEROL SULFATE (2.5 MG/3ML) 0.083% IN NEBU
2.5000 mg | INHALATION_SOLUTION | Freq: Four times a day (QID) | RESPIRATORY_TRACT | Status: DC | PRN
  Administered 2024-04-23 – 2024-04-24 (×3): 2.5 mg via RESPIRATORY_TRACT
  Filled 2024-04-19 (×3): qty 3

## 2024-04-19 NOTE — H&P (Addendum)
 History and Physical    Patient: Ryan Long BJY:[id number] DOB: 1961-10-23 DOA: 04/19/2024 DOS: the patient was seen and examined on 04/19/2024 PCP: Tretha Fu, MD  Patient coming from: Home via EMS  Chief Complaint:  Chief Complaint  Patient presents with   Chest Pain   HPI: Ryan Long is a 63 y.o. male with medical history significant of end-stage heart failure, hypertension, hyperlipidemia, HIV, COPD, CKD, anxiety, depression, and bipolar disorder presents with chest pain and headaches.  Records note patient was last hospitalized 3/31-4/6 for acute on chronic systolic heart failure exacerbation.  Record noted that during hospitalization patient was not a candidate for advanced therapies and discharged home with home hospice.  He has been experiencing chest pain and headaches for a couple of days, accompanied by frequent nausea without vomiting and anxiety. He denies swelling but reports shortness of breath, lightheadedness, and dizziness. He has experienced significant weight loss of about forty pounds.  He has a history of low potassium and magnesium  levels, for which he is currently receiving treatment. He questions the lack of prescribed supplements upon discharge, as he has had recurrent low levels. He uses oxygen  at home and has several tanks available.  He lives alone and recently quit smoking about a month ago. He does not consume alcohol .  Patient does note that he has had difficulties in caring for himself at home as he lives alone.  With EMS patient had been given 325 mg of aspirin  and 1 nitroglycerin .  In the emergency department patient was noted to be afebrile with respirations 18-23, blood pressures 83/68 at 94/66, and O2 saturations maintained on room air.  Labs significant for WBC 2.8, platelets 144, potassium 2.4, BUN 19, creatinine 1.55,  calcium  8, and high-sensitivity troponins negative x 2.  Patient had been ordered 2 g of magnesium  sulfate,  morphine  4 mg IV, and potassium chloride  40 meq IV.  Review of Systems: As mentioned in the history of present illness. All other systems reviewed and are negative. Past Medical History:  Diagnosis Date   Active smoker    AICD (automatic cardioverter/defibrillator) present    Alcohol  abuse    Allergy July 2023   Anxiety    AR (allergic rhinitis)    Bipolar 1 disorder (HCC)    Cervical lymphadenitis 04/20/2021   CHF (congestive heart failure) (HCC)    Chronic bronchitis (HCC)    Chronic systolic heart failure (HCC)    Controlled substance agreement signed 10/22/2018   COPD (chronic obstructive pulmonary disease) (HCC)    COPD (chronic obstructive pulmonary disease) (HCC) 10/04/2015   Cough 12/31/2018   Crack cocaine use    Depression    Genital herpes    HIV (human immunodeficiency virus infection) (HCC) dx'd 08/1993   HLD (hyperlipidemia)    Hypertension    NICM (nonischemic cardiomyopathy) (HCC)    Echocardiogram 06/28/11: EF 30-35%, mild MR, mild LAE;  No CAD by coronary CT angiogram 3/12 at [facility]   NSVT (nonsustained ventricular tachycardia) (HCC)    PTSD (post-traumatic stress disorder)    Past Surgical History:  Procedure Laterality Date   CARDIAC DEFIBRILLATOR PLACEMENT  01/08/2018   ICD IMPLANT N/A 01/08/2018   Procedure: ICD IMPLANT;  Surgeon: Lei Pump, MD;  Location: [facility] INVASIVE CV LAB;  Service: Cardiovascular;  Laterality: N/A;   RIGHT/LEFT HEART CATH AND CORONARY ANGIOGRAPHY N/A 01/03/2024   Procedure: RIGHT/LEFT HEART CATH AND CORONARY ANGIOGRAPHY;  Surgeon: Mardell Shade, MD;  Location: [facility] INVASIVE CV  LAB;  Service: Cardiovascular;  Laterality: N/A;   Social History:  reports that he has been smoking cigarettes. He has a 31.5 pack-year smoking history. He has never used smokeless tobacco. He reports that he does not currently use alcohol  after a past usage of about 6.0 standard drinks of alcohol  per week. He reports that he  does not currently use drugs after having used the following drugs: "Crack" cocaine.  No Known Allergies  Family History  Problem Relation Age of Onset   Glaucoma Mother    Mental illness Mother    Vision loss Mother    Hypertension Father    CAD Father    Mental illness Father    Alcohol  abuse Father    Drug abuse Father    Heart attack Father 31   Early death Father    Heart disease Father    Alcohol  abuse Brother    Drug abuse Brother    Drug abuse Brother     Prior to Admission medications   Medication Sig Start Date End Date Taking? Authorizing Provider  acetaminophen  (TYLENOL ) 325 MG tablet Take 2 tablets (650 mg total) by mouth every 6 (six) hours as needed for mild pain (pain score 1-3) (or Fever >/= 101). 03/07/24   Barbee Lew, MD  albuterol  (VENTOLIN  HFA) 108 [phone] Base) MCG/ACT inhaler Inhale 2 puffs into the lungs every 6 (six) hours as needed for wheezing or shortness of breath. 07/30/23   Vernell Goldsmith, MD  amiodarone  (PACERONE ) 200 MG tablet Take 1 tablet (200 mg total) by mouth daily. 03/28/24   Camnitz, Will Gaylyn Keas, MD  aspirin  EC 81 MG tablet Take 81 mg by mouth in the morning. Swallow whole.    [provider]  bictegravir-emtricitabine -tenofovir  AF (BIKTARVY ) 50-200-25 MG TABS tablet Take 1 tablet by mouth daily. Patient taking differently: Take 1 tablet by mouth in the morning. 08/15/23   Charolette Copier, MD  dronabinol  (MARINOL ) 5 MG capsule Take 1 capsule (5 mg total) by mouth 2 (two) times daily before lunch and supper. 02/09/24   Debria Fang, PA-C  finasteride  (PROSCAR ) 5 MG tablet TAKE 1 TABLET(5 MG) BY MOUTH DAILY Patient taking differently: Take 5 mg by mouth in the morning. 03/15/23   Vernell Goldsmith, MD  Melatonin 10 MG TABS Take 10 mg by mouth at bedtime.    [provider]  mexiletine (MEXITIL ) 250 MG capsule Take 1 capsule (250 mg total) by mouth every 12 (twelve) hours. 02/08/24   Debria Fang, PA-C   midodrine  (PROAMATINE ) 5 MG tablet Take 3 tablets (15 mg total) by mouth 3 (three) times daily. 03/07/24   Barbee Lew, MD  ondansetron  (ZOFRAN ) 4 MG tablet Take 1 tablet (4 mg total) by mouth every 8 (eight) hours as needed for nausea or vomiting. 03/12/24   Camnitz, Babetta Lesch, MD  oxyCODONE  (OXY IR/ROXICODONE ) 5 MG immediate release tablet Take 1 tablet (5 mg total) by mouth every 6 (six) hours as needed for severe pain (pain score 7-10). 03/22/24   Arrien, Mauricio Daniel, MD  polyethylene glycol (MIRALAX  / GLYCOLAX ) 17 g packet Take 17 g by mouth daily. 02/09/24   Debria Fang, PA-C  SYMBICORT 80-4.5 MCG/ACT inhaler Inhale 2 puffs into the lungs in the morning and at bedtime. 03/06/24   [provider]  tamsulosin  (FLOMAX ) 0.4 MG CAPS capsule TAKE 2 CAPSULES(0.8 MG) BY MOUTH DAILY AFTER SUPPER Patient taking differently: Take 0.4 mg by mouth 2 (two) times  daily. 07/26/23   Vernell Goldsmith, MD  torsemide  (DEMADEX ) 20 MG tablet Take 2 tablets (40 mg total) by mouth daily. In case of weight gain 2 to 3 lbs in 24 hrs or 5 lbs in 7 days, take 3 tablets daily until weight back to baseline 03/23/24 05/06/24  Arrien, Curlee Doss, MD  triamcinolone  ointment (KENALOG ) 0.5 % Apply 1 Application topically 2 (two) times daily. 08/15/23   Vernell Goldsmith, MD  valACYclovir  (VALTREX ) 500 MG tablet Take 1 tablet (500 mg total) by mouth daily. Patient taking differently: Take 500 mg by mouth in the morning. 11/15/22   Vernell Goldsmith, MD    Physical Exam: Vitals:   04/19/24 1502 04/19/24 1503 04/19/24 1630  BP: 94/66  (!) 83/68  Pulse: 83  80  Resp: 18  (!) 23  Temp: 97.8 F (36.6 C)    TempSrc: Oral    SpO2: 99%  99%  Weight:  69.4 kg   Height:  5\' 8"  (1.727 m)   Exam  Constitutional: Older male who appears to be in no acute distress and able to follow commands Eyes: PERRL, lids and conjunctivae normal ENMT: Mucous membranes are moist.  Fair dentition. Neck: normal, supple   Respiratory: clear to auscultation bilaterally, no wheezing, no crackles. Normal respiratory effort. No accessory muscle use.  Cardiovascular: Regular rate and rhythm, positive systolic murmur present.  No extremity edema.   Abdomen: no tenderness, no masses palpated.  Bowel sounds positive.  Musculoskeletal: no clubbing / cyanosis. No joint deformity upper and lower extremities. Good ROM, no contractures. Normal muscle tone.  Skin: no rashes, lesions, ulcers. No induration Neurologic: CN 2-12 grossly intact. Sensation intact, DTR normal. Strength 5/5 in all 4.  Psychiatric: Normal judgment and insight. Alert and oriented x 3. Normal mood.   Data Reviewed:  EKG reveals sinus rhythm at 76 bpm with PR interval and long QTc. Assessment and Plan:  Hypokalemia Acute on chronic.  On admission potassium noted to be 2.4.  Patient had been ordered 40 meq of potassium chloride  IV.  He questions why he is not sent home with potassium supplementation. - Give 60 mEq of potassium chloride  p.o. if able to tolerate - Continue to monitor and replace as needed  Chest pain Systolic congestive heart failure Acute on chronic.  Patient presents with reports of chest pain.  On physical exam patient does not appear grossly fluid overloaded.  Chest x-ray showed borderline cardiomegaly with no acute abnormality.  High-sensitivity troponins negative x 2.   Last heart cath noted normal coronaries when checked on 01/03/2024. BNP 1490 which is lower than when checked on 3/31.  Last echocardiogram from 09/2023 revealed reduced LV systolic function EF 20 to 25%, global hypokinesis, mild to moderate LV cavity dilatation, RV systolic function preserved, mild MR. Records note patient not a candidate for advanced therapies and ICD had been turned off.  Currently patient appears to be under hospice at home but not able to care for self - Strict I&Os and daily weights - Continue torsemide  in a.m.  Consider need of IV diuresis if  warranted reevaluation - Continue oxycodone  as needed for pain - Transitions of care consult  Hypotension Blood pressures noted to be soft as 83/68. - Continue midodrine   Paroxysmal atrial fibrillation Patient appears to be in a sinus rhythm.  Not on anticoagulation. -Continue rate control with amiodarone  and mexelitine  - Continue aspirin   HIV -Continue Biktarvy   COPD No signs of an exacerbation. - Continue inhalers  and breathing treatments as needed  Chronic kidney disease stage IIIa Creatinine noted to be 1.55 which appears around patient's baseline. - Recheck kidney function in a.m.  BPH - Continue tamsulosin  and finasteride   Debility Patient noted to live alone and unable to care for himself.  Question need of inpatient hospice. - Transitions of care consulted as patient  DVT prophylaxis: Lovenox  Advance Care Planning:   Code Status: Limited: Do not attempt resuscitation (DNR) -DNR-LIMITED -Do Not Intubate/DNI     Consults: None  Family Communication: None  Severity of Illness: The appropriate patient status for this patient is OBSERVATION. Observation status is judged to be reasonable and necessary in order to provide the required intensity of service to ensure the patient's safety. The patient's presenting symptoms, physical exam findings, and initial radiographic and laboratory data in the context of their medical condition is felt to place them at decreased risk for further clinical deterioration. Furthermore, it is anticipated that the patient will be medically stable for discharge from the hospital within 2 midnights of admission.   Author: Lena Qualia, MD 04/19/2024 4:59 PM  For on call review [email or web address].

## 2024-04-19 NOTE — ED Provider Notes (Signed)
 [place] EMERGENCY DEPARTMENT AT [facility] Provider Note   CSN: [id number] Arrival date & time: 04/19/24  1448     History {Add pertinent medical, surgical, social history, OB history to HPI:1} Chief Complaint  Patient presents with  . Chest Pain    Ryan Long is a 63 y.o. male with end stage heart failure, CKD, COPD, hypertension, hyperlipidemia, depression, anxiety, bipolar and HIV who presents BIB EMS for chest pain, dizziness, headache, and nausea that started yesterday.  EMS gave 4mg  zofran , 324 of aspirin , and 1 SL nitroglycerin .   C/w SOB, CP, lightheadedness/dizziness that started last night. Hasn't fallen or hit his head. Felt like he was going to pass out. No worsening swelling. No f/c. No cough, vomiting/diarrhea. +nausea. Decreased PO intake.  Takes midodrine  TID, hasn't had third dose yet. Hasn't missed any doses of his medicines. Nonradiating central CP now 7/10, the nitroglycerin  helped a little bit but then it came back. BP now 90s/70s. Laying down makes the pain better.   Recently admitted from 03/17/2024 to 03/23/2024 with heart failure exacerbation, diuresed -4 L.  Cardiology recommended to continue palliative care, as he is not a candidate for advanced heart failure therapies.  He is on home hospice.  Patient has history of VT managed with mexiletine.  ICD therapies have been turned off and patient is DNR.   Past Medical History:  Diagnosis Date  . Active smoker   . AICD (automatic cardioverter/defibrillator) present   . Alcohol  abuse   . Allergy July 2023  . Anxiety   . AR (allergic rhinitis)   . Bipolar 1 disorder (HCC)   . Cervical lymphadenitis 04/20/2021  . CHF (congestive heart failure) (HCC)   . Chronic bronchitis (HCC)   . Chronic systolic heart failure (HCC)   . Controlled substance agreement signed 10/22/2018  . COPD (chronic obstructive pulmonary disease) (HCC)   . COPD (chronic obstructive pulmonary disease) (HCC) 10/04/2015   . Cough 12/31/2018  . Crack cocaine use   . Depression   . Genital herpes   . HIV (human immunodeficiency virus infection) (HCC) dx'd 08/1993  . HLD (hyperlipidemia)   . Hypertension   . NICM (nonischemic cardiomyopathy) (HCC)    Echocardiogram 06/28/11: EF 30-35%, mild MR, mild LAE;  No CAD by coronary CT angiogram 3/12 at [facility]  . NSVT (nonsustained ventricular tachycardia) (HCC)   . PTSD (post-traumatic stress disorder)        Home Medications Prior to Admission medications   Medication Sig Start Date End Date Taking? Authorizing Provider  acetaminophen  (TYLENOL ) 325 MG tablet Take 2 tablets (650 mg total) by mouth every 6 (six) hours as needed for mild pain (pain score 1-3) (or Fever >/= 101). 03/07/24   Barbee Lew, MD  albuterol  (VENTOLIN  HFA) 108 (90 Base) MCG/ACT inhaler Inhale 2 puffs into the lungs every 6 (six) hours as needed for wheezing or shortness of breath. 07/30/23   Vernell Goldsmith, MD  amiodarone  (PACERONE ) 200 MG tablet Take 1 tablet (200 mg total) by mouth daily. 03/28/24   Camnitz, Babetta Lesch, MD  aspirin  EC 81 MG tablet Take 81 mg by mouth in the morning. Swallow whole.    [provider]  bictegravir-emtricitabine -tenofovir  AF (BIKTARVY ) 50-200-25 MG TABS tablet Take 1 tablet by mouth daily. Patient taking differently: Take 1 tablet by mouth in the morning. 08/15/23   Charolette Copier, MD  dronabinol  (MARINOL ) 5 MG capsule Take 1 capsule (5 mg total) by mouth  2 (two) times daily before lunch and supper. 02/09/24   Debria Fang, PA-C  finasteride  (PROSCAR ) 5 MG tablet TAKE 1 TABLET(5 MG) BY MOUTH DAILY Patient taking differently: Take 5 mg by mouth in the morning. 03/15/23   Vernell Goldsmith, MD  Melatonin 10 MG TABS Take 10 mg by mouth at bedtime.    [provider]  mexiletine (MEXITIL ) 250 MG capsule Take 1 capsule (250 mg total) by mouth every 12 (twelve) hours. 02/08/24   Debria Fang, PA-C   midodrine  (PROAMATINE ) 5 MG tablet Take 3 tablets (15 mg total) by mouth 3 (three) times daily. 03/07/24   Barbee Lew, MD  ondansetron  (ZOFRAN ) 4 MG tablet Take 1 tablet (4 mg total) by mouth every 8 (eight) hours as needed for nausea or vomiting. 03/12/24   Camnitz, Babetta Lesch, MD  oxyCODONE  (OXY IR/ROXICODONE ) 5 MG immediate release tablet Take 1 tablet (5 mg total) by mouth every 6 (six) hours as needed for severe pain (pain score 7-10). 03/22/24   Arrien, Mauricio Daniel, MD  polyethylene glycol (MIRALAX  / GLYCOLAX ) 17 g packet Take 17 g by mouth daily. 02/09/24   Debria Fang, PA-C  SYMBICORT 80-4.5 MCG/ACT inhaler Inhale 2 puffs into the lungs in the morning and at bedtime. 03/06/24   [provider]  tamsulosin  (FLOMAX ) 0.4 MG CAPS capsule TAKE 2 CAPSULES(0.8 MG) BY MOUTH DAILY AFTER SUPPER Patient taking differently: Take 0.4 mg by mouth 2 (two) times daily. 07/26/23   Vernell Goldsmith, MD  torsemide  (DEMADEX ) 20 MG tablet Take 2 tablets (40 mg total) by mouth daily. In case of weight gain 2 to 3 lbs in 24 hrs or 5 lbs in 7 days, take 3 tablets daily until weight back to baseline 03/23/24 05/06/24  Arrien, Curlee Doss, MD  triamcinolone  ointment (KENALOG ) 0.5 % Apply 1 Application topically 2 (two) times daily. 08/15/23   Vernell Goldsmith, MD  valACYclovir  (VALTREX ) 500 MG tablet Take 1 tablet (500 mg total) by mouth daily. Patient taking differently: Take 500 mg by mouth in the morning. 11/15/22   Vernell Goldsmith, MD      Allergies    Patient has no known allergies.    Review of Systems   Review of Systems A 10 point review of systems was performed and is negative unless otherwise reported in HPI.  Physical Exam Updated Vital Signs BP 94/66   Pulse 83   Temp 97.8 F (36.6 C) (Oral)   Resp 18   Ht 5\' 8"  (1.727 m)   Wt 69.4 kg   SpO2 99%   BMI 23.26 kg/m  Physical Exam General: Normal appearing {Desc; male/male:11659}, lying in bed.  HEENT: PERRLA,  Sclera anicteric, MMM, trachea midline.  Cardiology: RRR, no murmurs/rubs/gallops. BL radial and DP pulses equal bilaterally.  Resp: Normal respiratory rate and effort. CTAB, no wheezes, rhonchi, crackles.  Abd: Soft, non-tender, non-distended. No rebound tenderness or guarding.  GU: Deferred. MSK: No peripheral edema or signs of trauma. Extremities without deformity or TTP. No cyanosis or clubbing. Skin: warm, dry. No rashes or lesions. Back: No CVA tenderness Neuro: A&Ox4, CNs II-XII grossly intact. MAEs. Sensation grossly intact.  Psych: Normal mood and affect.   ED Results / Procedures / Treatments   Labs (all labs ordered are listed, but only abnormal results are displayed) Labs Reviewed  BASIC METABOLIC PANEL WITH GFR  CBC  TROPONIN I (HIGH SENSITIVITY)    EKG None  Radiology No results found.  Procedures Procedures  {Document cardiac monitor, telemetry assessment procedure when appropriate:1}  Medications Ordered in ED Medications - No data to display  ED Course/ Medical Decision Making/ A&P                          Medical Decision Making   This patient presents to the ED for concern of ***, this involves an extensive number of treatment options, and is a complaint that carries with it a high risk of complications and morbidity.  I considered the following differential and admission for this acute, potentially life threatening condition.   MDM:    ***  Clinical Course as of 04/19/24 1633  Sat Apr 19, 2024  1550 Potassium(!!): 2.4 Acute on chronic hypokalemia, correlates w/ new prolonged QT on EKG [HN]  1551 Creatinine(!): 1.55 C/w baseline [HN]  1551 WBC(!): 2.8 Consistent  [HN]  1604 Troponin I (High Sensitivity): 12 neg [HN]  1606 DG Chest 2 View No active cardiopulmonary disease. [HN]    Clinical Course User Index [HN] Merdis Stalling, MD    Labs: I Ordered, and personally interpreted labs.  The pertinent results include:  ***  Imaging  Studies ordered: I ordered imaging studies including *** I independently visualized and interpreted imaging. I agree with the radiologist interpretation  Additional history obtained from ***.  External records from outside source obtained and reviewed including ***  Cardiac Monitoring: .The patient was maintained on a cardiac monitor.  I personally viewed and interpreted the cardiac monitored which showed an underlying rhythm of: ***  Reevaluation: After the interventions noted above, I reevaluated the patient and found that they have :{resolved/improved/worsened:23923::"improved"}  Social Determinants of Health: .***  Disposition:  ***  Co morbidities that complicate the patient evaluation . Past Medical History:  Diagnosis Date  . Active smoker   . AICD (automatic cardioverter/defibrillator) present   . Alcohol  abuse   . Allergy July 2023  . Anxiety   . AR (allergic rhinitis)   . Bipolar 1 disorder (HCC)   . Cervical lymphadenitis 04/20/2021  . CHF (congestive heart failure) (HCC)   . Chronic bronchitis (HCC)   . Chronic systolic heart failure (HCC)   . Controlled substance agreement signed 10/22/2018  . COPD (chronic obstructive pulmonary disease) (HCC)   . COPD (chronic obstructive pulmonary disease) (HCC) 10/04/2015  . Cough 12/31/2018  . Crack cocaine use   . Depression   . Genital herpes   . HIV (human immunodeficiency virus infection) (HCC) dx'd 08/1993  . HLD (hyperlipidemia)   . Hypertension   . NICM (nonischemic cardiomyopathy) (HCC)    Echocardiogram 06/28/11: EF 30-35%, mild MR, mild LAE;  No CAD by coronary CT angiogram 3/12 at [facility]  . NSVT (nonsustained ventricular tachycardia) (HCC)   . PTSD (post-traumatic stress disorder)      Medicines No orders of the defined types were placed in this encounter.   I have reviewed the patients home medicines and have made adjustments as needed  Problem List / ED Course: Problem List Items  Addressed This Visit   None        {Document critical care time when appropriate:1} {Document review of labs and clinical decision tools ie heart score, Chads2Vasc2 etc:1}  {Document your independent review of radiology images, and any outside records:1} {Document your discussion with family members, caretakers, and with consultants:1} {Document social determinants of health affecting pt's care:1} {Document your decision making why or why not admission, treatments  were needed:1}  This note was created using dictation software, which may contain spelling or grammatical errors.

## 2024-04-19 NOTE — ED Triage Notes (Signed)
 BIB EMS for chest pain, dizziness, headache, and nausea that started yesterday. Zofran  PTA of EMS. EMS gave 324 of aspirin  and 1 nitroglycerin .

## 2024-04-20 DIAGNOSIS — N1831 Chronic kidney disease, stage 3a: Secondary | ICD-10-CM

## 2024-04-20 DIAGNOSIS — E876 Hypokalemia: Secondary | ICD-10-CM | POA: Diagnosis not present

## 2024-04-20 DIAGNOSIS — N400 Enlarged prostate without lower urinary tract symptoms: Secondary | ICD-10-CM

## 2024-04-20 DIAGNOSIS — I480 Paroxysmal atrial fibrillation: Secondary | ICD-10-CM

## 2024-04-20 DIAGNOSIS — Z21 Asymptomatic human immunodeficiency virus [HIV] infection status: Secondary | ICD-10-CM

## 2024-04-20 DIAGNOSIS — I5022 Chronic systolic (congestive) heart failure: Secondary | ICD-10-CM

## 2024-04-20 DIAGNOSIS — I472 Ventricular tachycardia, unspecified: Secondary | ICD-10-CM

## 2024-04-20 DIAGNOSIS — Z7189 Other specified counseling: Secondary | ICD-10-CM

## 2024-04-20 LAB — BASIC METABOLIC PANEL WITH GFR
Anion gap: 7 (ref 5–15)
BUN: 17 mg/dL (ref 8–23)
CO2: 29 mmol/L (ref 22–32)
Calcium: 8.4 mg/dL — ABNORMAL LOW (ref 8.9–10.3)
Chloride: 102 mmol/L (ref 98–111)
Creatinine, Ser: 1.4 mg/dL — ABNORMAL HIGH (ref 0.61–1.24)
GFR, Estimated: 57 mL/min — ABNORMAL LOW (ref >60)
Glucose, Bld: 104 mg/dL — ABNORMAL HIGH (ref 70–99)
Potassium: 3.2 mmol/L — ABNORMAL LOW (ref 3.5–5.1)
Sodium: 138 mmol/L (ref 135–145)

## 2024-04-20 LAB — CBC
HCT: 46.2 % (ref 39.0–52.0)
Hemoglobin: 15.6 g/dL (ref 13.0–17.0)
MCH: 33.3 pg (ref 26.0–34.0)
MCHC: 33.8 g/dL (ref 30.0–36.0)
MCV: 98.7 fL (ref 80.0–100.0)
Platelets: 123 10*3/uL — ABNORMAL LOW (ref 150–400)
RBC: 4.68 MIL/uL (ref 4.22–5.81)
RDW: 15 % (ref 11.5–15.5)
WBC: 2.7 10*3/uL — ABNORMAL LOW (ref 4.0–10.5)
nRBC: 0 % (ref 0.0–0.2)

## 2024-04-20 LAB — MAGNESIUM: Magnesium: 2.1 mg/dL (ref 1.7–2.4)

## 2024-04-20 MED ORDER — TAMSULOSIN HCL 0.4 MG PO CAPS
0.8000 mg | ORAL_CAPSULE | Freq: Two times a day (BID) | ORAL | Status: DC
  Administered 2024-04-20 – 2024-04-24 (×8): 0.8 mg via ORAL
  Filled 2024-04-20 (×8): qty 2

## 2024-04-20 MED ORDER — TRAZODONE HCL 50 MG PO TABS
150.0000 mg | ORAL_TABLET | Freq: Every evening | ORAL | Status: DC | PRN
  Administered 2024-04-28 – 2024-05-08 (×11): 150 mg via ORAL
  Filled 2024-04-20 (×12): qty 1

## 2024-04-20 MED ORDER — POTASSIUM CHLORIDE CRYS ER 20 MEQ PO TBCR: 40.0000 meq | EXTENDED_RELEASE_TABLET | ORAL | Status: DC

## 2024-04-20 MED ORDER — LORAZEPAM 0.5 MG PO TABS
0.5000 mg | ORAL_TABLET | Freq: Two times a day (BID) | ORAL | Status: DC | PRN
  Administered 2024-04-20 – 2024-04-22 (×5): 0.5 mg via ORAL
  Filled 2024-04-20 (×5): qty 1

## 2024-04-20 MED ORDER — DOCUSATE SODIUM 100 MG PO CAPS
100.0000 mg | ORAL_CAPSULE | Freq: Two times a day (BID) | ORAL | Status: DC | PRN
  Administered 2024-04-20 – 2024-05-09 (×7): 100 mg via ORAL
  Filled 2024-04-20 (×9): qty 1

## 2024-04-20 MED ORDER — TAMSULOSIN HCL 0.4 MG PO CAPS
0.4000 mg | ORAL_CAPSULE | Freq: Once | ORAL | Status: AC
  Administered 2024-04-20: 0.4 mg via ORAL
  Filled 2024-04-20: qty 1

## 2024-04-20 MED ORDER — DOCUSATE SODIUM 50 MG/5ML PO LIQD: 100.0000 mg | Freq: Two times a day (BID) | ORAL | Status: DC | PRN

## 2024-04-20 MED ORDER — POTASSIUM CHLORIDE CRYS ER 20 MEQ PO TBCR
60.0000 meq | EXTENDED_RELEASE_TABLET | Freq: Once | ORAL | Status: AC
  Administered 2024-04-20: 60 meq via ORAL
  Filled 2024-04-20: qty 3

## 2024-04-20 MED ORDER — MORPHINE SULFATE 10 MG/5ML PO SOLN
2.5000 mg | ORAL | Status: DC | PRN
  Administered 2024-04-20 – 2024-05-02 (×43): 2.5 mg via ORAL
  Filled 2024-04-20 (×43): qty 5

## 2024-04-20 NOTE — Assessment & Plan Note (Signed)
 Continue twice daily Flomax  Continue Proscar 

## 2024-04-20 NOTE — Assessment & Plan Note (Signed)
 Strict intake and output monitoring Creatinine near baseline Minimizing nephrotoxic agents as much as possible Serial chemistries to monitor renal function and electrolytes

## 2024-04-20 NOTE — Assessment & Plan Note (Signed)
 Chest pain thought to be secondary to progressive symptoms of end-stage heart failure Monitoring and managing symptoms as noted above.

## 2024-04-20 NOTE — Assessment & Plan Note (Signed)
   No evidence of COPD exacerbation this time  Continue home regimen of maintenance inhalers  As needed bronchodilator therapy for episodic shortness of breath and wheezing.  

## 2024-04-20 NOTE — Assessment & Plan Note (Signed)
·   Replacing with potassium chloride °· Evaluating for concurrent hypomagnesemia  °· Monitoring potassium levels with serial chemistries. ° °

## 2024-04-20 NOTE — Plan of Care (Signed)

## 2024-04-20 NOTE — Assessment & Plan Note (Signed)
 Continue mexiletine and amiodarone  Rate controlled Not on anticoagulation Monitoring on telemetry

## 2024-04-20 NOTE — Care Management Obs Status (Signed)
 MEDICARE OBSERVATION STATUS NOTIFICATION   Patient Details  Name: Ryan Long MRN: [id number] Date of Birth: 10/08/1961   Medicare Observation Status Notification Given:  Yes    Gertha Ku, LCSW 04/20/2024, 4:36 PM

## 2024-04-20 NOTE — Evaluation (Signed)
 Physical Therapy Evaluation Patient Details Name: Ryan Long MRN: [id number] DOB: 09/15/1961 Today's Date: 04/20/2024  History of Present Illness  63 yo male admitted with hypokalemia, chest pain, acute on chronic HF. Hx of bipolar d/o, PTSD, HIV, CKD, COPD, depression, anxiety, HF, NSVT, ICD  Clinical Impression  On eval, pt was Mod Ind with mobility. UPon my arrival, pt walking out of bathroom with RW unassisted. Pt agreeable to ambulate with PT but he reported dizziness and some chest pain. Had pt sit down to rest while I assessed his vitals. BP 100/76, HR 100 bpm, O2 100% on RA. While assessing pts vitals, MD arrived to room. Session ended upon MD arrival. Will plan to check back as schedule allows. Presently, do not anticipate any f/u PT needs.         If plan is discharge home, recommend the following:     Can travel by private vehicle        Equipment Recommendations None recommended by PT  Recommendations for Other Services       Functional Status Assessment Patient has had a recent decline in their functional status and demonstrates the ability to make significant improvements in function in a reasonable and predictable amount of time.     Precautions / Restrictions Precautions Precaution/Restrictions Comments: monitor BP Restrictions Weight Bearing Restrictions Per Provider Order: No      Mobility  Bed Mobility               General bed mobility comments: pt oob in bathroom upon my arrival    Transfers                   General transfer comment: pt oob in bathroom upon my arrival    Ambulation/Gait Ambulation/Gait assistance: Modified independent (Device/Increase time) Gait Distance (Feet): 12 Feet Assistive device: Rolling walker (2 wheels)         General Gait Details: Pt walked from bathroom back to bed. He c/o some dizziness and chest pain. Had him sit down so I could assess his BP. BP 100/76, O2 100%, HR 100 bpm  Stairs             Wheelchair Mobility     Tilt Bed    Modified Rankin (Stroke Patients Only)       Balance Overall balance assessment: History of Falls, Needs assistance         Standing balance support: During functional activity, Reliant on assistive device for balance Standing balance-Leahy Scale: Good                               Pertinent Vitals/Pain Pain Assessment Pain Assessment: Faces Faces Pain Scale: Hurts little more Pain Location: chest Pain Intervention(s): Monitored during session    Home Living Family/patient expects to be discharged to::  [facility]) Living Arrangements: Alone Available Help at Discharge: Friend(s) Type of Home: House ([employer]) Home Access: Stairs to enter Entrance Stairs-Rails: Can reach both;Right;Left Entrance Stairs-Number of Steps: 7   Home Layout: Able to live on main level with bedroom/bathroom Home Equipment: None;Rollator (4 wheels) Additional Comments: lives in boarding house.    Prior Function Prior Level of Function : Independent/Modified Independent             Mobility Comments: ambulating short distances ADLs Comments: Drives a "gas scooter" to get groceries     Extremity/Trunk Assessment   Upper Extremity Assessment Upper Extremity Assessment:  Overall WFL for tasks assessed    Lower Extremity Assessment Lower Extremity Assessment: Generalized weakness    Cervical / Trunk Assessment Cervical / Trunk Assessment: Normal  Communication   Communication Communication: No apparent difficulties    Cognition Arousal: Alert Behavior During Therapy: WFL for tasks assessed/performed   PT - Cognitive impairments: No apparent impairments                         Following commands: Intact       Cueing Cueing Techniques: Verbal cues     General Comments      Exercises     Assessment/Plan    PT Assessment Patient needs continued PT services  PT Problem List  Decreased activity tolerance;Decreased mobility       PT Treatment Interventions Gait training;Functional mobility training;Therapeutic activities;Therapeutic exercise;Patient/family education;Balance training;DME instruction    PT Goals (Current goals can be found in the Care Plan section)  Acute Rehab PT Goals Patient Stated Goal: feel better PT Goal Formulation: With patient Time For Goal Achievement: 05/04/24 Potential to Achieve Goals: Good    Frequency Min 2X/week     Co-evaluation               AM-PAC PT "6 Clicks" Mobility  Outcome Measure Help needed turning from your back to your side while in a flat bed without using bedrails?: None Help needed moving from lying on your back to sitting on the side of a flat bed without using bedrails?: None Help needed moving to and from a bed to a chair (including a wheelchair)?: None Help needed standing up from a chair using your arms (e.g., wheelchair or bedside chair)?: None Help needed to walk in hospital room?: A Little Help needed climbing 3-5 steps with a railing? : A Little 6 Click Score: 22    End of Session     Patient left: in bed;with call bell/phone within reach (sitting EOB with MD)   PT Visit Diagnosis: Difficulty in walking, not elsewhere classified (R26.2)    Time: 1610-9604 PT Time Calculation (min) (ACUTE ONLY): 8 min   Charges:   PT Evaluation $PT Eval Low Complexity: 1 Low   PT General Charges $$ ACUTE PT VISIT: 1 Visit           Tanda Falter, PT Acute Rehabilitation  Office: [phone]

## 2024-04-20 NOTE — Assessment & Plan Note (Signed)
 Patient presenting with symptoms of intermittent lightheadedness chest discomfort and shortness of breath consistent with end-stage congestive heart failure Patient is already on hospice for his advanced disease with a [facility] Prognosis is poor, patient has lost 45 pounds in the past several months ICD was shut off earlier this year No need for diuresis at this time as patient appears to be euvolemic For symptom control, providing patient with as needed doses of small amounts of morphine  for chest pain and shortness of breath as well as as needed low-dose Ativan  for associated anxiety Lengthy discussion surrounding goals of care was had with patient.  DNR/DNI was confirmed but patient would benefit from further goals of care discussions as well as hospice options after discharge Palliative care consultation placed, case discussed with [name] who will see patient in the morning

## 2024-04-20 NOTE — Assessment & Plan Note (Signed)
 History of admission for VT storm earlier in the year Continue mexiletine and amiodarone  Ongoing on telemetry

## 2024-04-20 NOTE — Hospital Course (Signed)
 63 year old male with past medical history of systolic and diastolic congestive heart failure (Echo 09/2023 EF 20-25% with G1DD) status post AICD (has been off since 01/28/2024), NSVT, COPD, HIV (CD4 count 429, undetectable VL 04/14/2024), hyperlipidemia, chronic kidney disease stage IIIa (baseline Cr 1.5-1.6), benign prostatic hyperplasia, paroxysmal atrial fibrillation not on anticoagulation, bipolar disorder, polysubstance abuse.  Patient is on home hospice.  Patient presented to [facility] emergency department via EMS with complaints of chest pain shortness of breath and lightheadedness.  Upon evaluation in the emergency department due to patient complaining of chest discomfort and ongoing symptoms of end-stage congestive heart failure the hospitalist group was called to assess the patient for admission to the hospital.

## 2024-04-20 NOTE — Assessment & Plan Note (Signed)
 Continue Biktarvy Continue Valtrex due to history of recurrent HSV Outpatient ID follow-up

## 2024-04-20 NOTE — Assessment & Plan Note (Signed)
 Patient would likely benefit from discharge to an assisted living or skilled nursing facility with ongoing hospice services  Unlikely to be a candidate for residential hospice  Palliative care consult as noted above.   See remainder of plan as above

## 2024-04-20 NOTE — Progress Notes (Signed)
 PROGRESS NOTE   Ryan Long  ZOX:[id number] DOB: May 30, 1961 DOA: 04/19/2024 PCP: Tretha Fu, MD   Date of Service: the patient was seen and examined on 04/20/2024  Brief Narrative:  63 year old male with past medical history of systolic and diastolic congestive heart failure (Echo 09/2023 EF 20-25% with G1DD) status post AICD (has been off since 01/28/2024), NSVT, COPD, HIV (CD4 count 429, undetectable VL 04/14/2024), hyperlipidemia, chronic kidney disease stage IIIa (baseline Cr 1.5-1.6), benign prostatic hyperplasia, paroxysmal atrial fibrillation not on anticoagulation, bipolar disorder, polysubstance abuse.  Patient is on home hospice.  Patient presented to [facility] emergency department via EMS with complaints of chest pain shortness of breath and lightheadedness.  Upon evaluation in the emergency department due to patient complaining of chest discomfort and ongoing symptoms of end-stage congestive heart failure the hospitalist group was called to assess the patient for admission to the hospital.   Assessment & Plan End-stage systolic heart failure, chronic (HCC) Patient presenting with symptoms of intermittent lightheadedness chest discomfort and shortness of breath consistent with end-stage congestive heart failure Patient is already on hospice for his advanced disease with a [facility] Prognosis is poor, patient has lost 45 pounds in the past several months ICD was shut off earlier this year No need for diuresis at this time as patient appears to be euvolemic For symptom control, providing patient with as needed doses of small amounts of morphine  for chest pain and shortness of breath as well as as needed low-dose Ativan  for associated anxiety Lengthy discussion surrounding goals of care was had with patient.  DNR/DNI was confirmed but patient would benefit from further goals of care discussions as well as hospice options after discharge Palliative care  consultation placed, case discussed with [name] who will see patient in the morning Goals of care, counseling/discussion Patient would likely benefit from discharge to an assisted living or skilled nursing facility with ongoing hospice services  Unlikely to be a candidate for residential hospice  Palliative care consult as noted above.   See remainder of plan as above Chest pain Chest pain thought to be secondary to progressive symptoms of end-stage heart failure Monitoring and managing symptoms as noted above. Hypokalemia Replacing with potassium chloride  Evaluating for concurrent hypomagnesemia  Monitoring potassium levels with serial chemistries.  HIV (human immunodeficiency virus infection) (HCC) Continue Biktarvy  Continue Valtrex  due to history of recurrent HSV Outpatient ID follow-up Paroxysmal atrial fibrillation (HCC) Continue mexiletine and amiodarone  Rate controlled Not on anticoagulation Monitoring on telemetry Ventricular tachycardia (HCC) History of admission for VT storm earlier in the year Continue mexiletine and amiodarone  Ongoing on telemetry COPD (chronic obstructive pulmonary disease) (HCC) No evidence of COPD exacerbation this time Continue home regimen of maintenance inhalers As needed bronchodilator therapy for episodic shortness of breath and wheezing.  Chronic kidney disease, stage 3a (HCC) Strict intake and output monitoring Creatinine near baseline Minimizing nephrotoxic agents as much as possible Serial chemistries to monitor renal function and electrolytes  BPH (benign prostatic hyperplasia) Continue twice daily Flomax  Continue Proscar      Subjective:  Patient continues to complain of intermittent fleeting chest discomfort.  Patient also complaining of associated generalized weakness and lightheadedness with ambulation.  Physical Exam:  Vitals:   04/19/24 1813 04/19/24 2017 04/20/24 0212 04/20/24 0618  BP: 96/72 95/78 90/66  92/62   Pulse: 80 98 88 80  Resp: 17 20 16 16   Temp: (!) 97.5 F (36.4 C) 97.6 F (36.4 C) 97.9 F (36.6 C) 97.7 F (36.5 C)  TempSrc: Oral Oral Oral Oral  SpO2: 98% 100% 99% 99%  Weight: 69.2 kg     Height: 5\' 8"  (1.727 m)       Constitutional: Awake alert and oriented x3, no associated distress.   Skin: no rashes, no lesions, good skin turgor noted. Eyes: Pupils are equally reactive to light.  No evidence of scleral icterus or conjunctival pallor.  ENMT: Moist mucous membranes noted.  Posterior pharynx clear of any exudate or lesions.   Respiratory: clear to auscultation bilaterally, no wheezing, no crackles. Normal respiratory effort. No accessory muscle use.  Cardiovascular: Regular rate and rhythm, no murmurs / rubs / gallops. No extremity edema. 2+ pedal pulses. No carotid bruits.  Abdomen: Abdomen is soft and nontender.  No evidence of intra-abdominal masses.  Positive bowel sounds noted in all quadrants.   Musculoskeletal: No joint deformity upper and lower extremities. Good ROM, no contractures. Normal muscle tone.    Data Reviewed:  I have personally reviewed and interpreted labs, imaging.  Significant findings are   CBC: Recent Labs  Lab 04/14/24 1359 04/19/24 1513 04/20/24 0414  WBC 2.5* 2.8* 2.7*  NEUTROABS 1,303*  --   --   HGB 18.5* 16.3 15.6  HCT 54.2* 46.9 46.2  MCV 97.0 96.3 98.7  PLT 151 144* 123*   Basic Metabolic Panel: Recent Labs  Lab 04/14/24 1359 04/19/24 1513 04/20/24 0414  NA 141 140 138  K 2.9* 2.4* 3.2*  CL 93* 97* 102  CO2 36* 32 29  GLUCOSE 111* 128* 104*  BUN 23 19 17   CREATININE 2.15* 1.55* 1.40*  CALCIUM  9.1 8.0* 8.4*  MG  --   --  2.1   GFR: Estimated Creatinine Clearance: 52.9 mL/min (A) (by C-G formula based on SCr of 1.4 mg/dL (H)). Liver Function Tests: Recent Labs  Lab 04/14/24 1359  AST 29  ALT 26  BILITOT 1.9*  PROT 6.5    Telemetry: Personally reviewed.  Rhythm is normal sinus rhythm with heart rate of 80 bpm.   No dynamic ST segment changes appreciated.   Code Status:  DNR.  Code status decision has been confirmed with: patient    Severity of Illness:  The appropriate patient status for this patient is OBSERVATION. Observation status is judged to be reasonable and necessary in order to provide the required intensity of service to ensure the patient's safety. The patient's presenting symptoms, physical exam findings, and initial radiographic and laboratory data in the context of their medical condition is felt to place them at decreased risk for further clinical deterioration. Furthermore, it is anticipated that the patient will be medically stable for discharge from the hospital within 2 midnights of admission.   Time spent:  50 minutes  Author:  True Fuss MD  04/20/2024 9:27 AM

## 2024-04-21 DIAGNOSIS — E876 Hypokalemia: Secondary | ICD-10-CM

## 2024-04-21 LAB — RENAL FUNCTION PANEL
Albumin: 3.4 g/dL — ABNORMAL LOW (ref 3.5–5.0)
Anion gap: 10 (ref 5–15)
BUN: 13 mg/dL (ref 8–23)
CO2: 30 mmol/L (ref 22–32)
Calcium: 8.9 mg/dL (ref 8.9–10.3)
Chloride: 104 mmol/L (ref 98–111)
Creatinine, Ser: 1.23 mg/dL (ref 0.61–1.24)
GFR, Estimated: >60 mL/min (ref >60)
Glucose, Bld: 94 mg/dL (ref 70–99)
Phosphorus: 3.8 mg/dL (ref 2.5–4.6)
Potassium: 3.5 mmol/L (ref 3.5–5.1)
Sodium: 144 mmol/L (ref 135–145)

## 2024-04-21 LAB — MAGNESIUM: Magnesium: 1.8 mg/dL (ref 1.7–2.4)

## 2024-04-21 MED ORDER — POTASSIUM CHLORIDE CRYS ER 20 MEQ PO TBCR
40.0000 meq | EXTENDED_RELEASE_TABLET | Freq: Once | ORAL | Status: AC
  Administered 2024-04-21: 40 meq via ORAL
  Filled 2024-04-21: qty 2

## 2024-04-21 MED ORDER — TORSEMIDE 20 MG PO TABS
20.0000 mg | ORAL_TABLET | Freq: Every day | ORAL | Status: DC
  Administered 2024-04-21 – 2024-05-03 (×13): 20 mg via ORAL
  Filled 2024-04-21 (×13): qty 1

## 2024-04-21 MED ORDER — SODIUM CHLORIDE 0.9 % IV BOLUS
500.0000 mL | Freq: Once | INTRAVENOUS | Status: AC
  Administered 2024-04-21: 500 mL via INTRAVENOUS

## 2024-04-21 NOTE — Plan of Care (Signed)

## 2024-04-21 NOTE — Consult Note (Signed)
 Consultation Note Date: 04/21/2024   Patient Name: Ryan Long  DOB: 1961-12-02  MRN: [id number]  Age / Sex: 63 y.o., male  PCP: Tretha Fu, MD Referring Physician: [place], Eric J, DO  Reason for Consultation: Establishing goals of care  HPI/Patient Profile: 63 y.o. male  with past medical history of systolic and diastolic congestive heart failure (Echo 09/2023 EF 20-25% with G1DD) status post AICD (has been off since 01/28/2024), NSVT, COPD, HIV (CD4 count 429, undetectable VL 04/14/2024), hyperlipidemia, chronic kidney disease stage IIIa (baseline Cr 1.5-1.6), benign prostatic hyperplasia, paroxysmal atrial fibrillation not on anticoagulation, bipolar disorder, polysubstance abuse. Patient is on home hospice.   admitted on 04/19/2024 with chest pain shortness of breath and lightheadedness.    Clinical Assessment and Goals of Care:  Patient remains admitted to hospital medicine service for end-stage systolic heart failure, episodic chest pain and electrolyte abnormality such as hypokalemia, underlying HIV and COPD.   I met with the patient at bedside, he is awake alert, in no distress at present.  Palliative medicine is specialized medical care for people living with serious illness. It focuses on providing relief from the symptoms and stress of a serious illness. The goal is to improve quality of life for both the patient and the family. Goals of care: Broad aims of medical therapy in relation to the patient's values and preferences. Our aim is to provide medical care aimed at enabling patients to achieve the goals that matter most to them, given the circumstances of their particular medical situation and their constraints.   Medication regimen discussed. Hospice philosophy discussed. See below.   NEXT OF KIN  NONE - no friends, no family,no next of kin.   SUMMARY OF RECOMMENDATIONS     DNR Continue current mode of care Recommend ALF with hospice - patient wishes to switch hospice agencies.  Thank you for the consult.   Code Status/Advance Care Planning: DNR   Symptom Management:     Palliative Prophylaxis:  Delirium Protocol   Psycho-social/Spiritual:  Desire for further Chaplaincy support:yes Additional Recommendations: Education on Hospice  Prognosis:  < 6 months  Discharge Planning:  ALF with a new hospice agency as per patient request.        Primary Diagnoses: Present on Admission:  Hypokalemia  Chronic kidney disease, stage 3a (HCC)  HIV (human immunodeficiency virus infection) (HCC)  COPD (chronic obstructive pulmonary disease) (HCC)  BPH (benign prostatic hyperplasia)  End-stage systolic heart failure, chronic (HCC)  Hypotension  Paroxysmal atrial fibrillation (HCC)  Debility  Chest pain  Ventricular tachycardia (HCC)   I have reviewed the medical record, interviewed the patient and family, and examined the patient. The following aspects are pertinent.  Past Medical History:  Diagnosis Date   Active smoker    AICD (automatic cardioverter/defibrillator) present    Alcohol  abuse    Allergy July 2023   Anxiety    AR (allergic rhinitis)    Bipolar 1 disorder (HCC)    Cervical lymphadenitis 04/20/2021   CHF (congestive heart failure) (  HCC)    Chronic bronchitis (HCC)    Chronic systolic heart failure (HCC)    Controlled substance agreement signed 10/22/2018   COPD (chronic obstructive pulmonary disease) (HCC)    COPD (chronic obstructive pulmonary disease) (HCC) 10/04/2015   Cough 12/31/2018   Crack cocaine use    Depression    Genital herpes    HIV (human immunodeficiency virus infection) (HCC) dx'd 08/1993   HLD (hyperlipidemia)    Hypertension    NICM (nonischemic cardiomyopathy) (HCC)    Echocardiogram 06/28/11: EF 30-35%, mild MR, mild LAE;  No CAD by coronary CT angiogram 3/12 at [facility]   NSVT  (nonsustained ventricular tachycardia) (HCC)    PTSD (post-traumatic stress disorder)    Social History   Socioeconomic History   Marital status: Single    Spouse name: Not on file   Number of children: Not on file   Years of education: Not on file   Highest education level: GED or equivalent  Occupational History   Not on file  Tobacco Use   Smoking status: Every Day    Current packs/day: 0.75    Average packs/day: 0.8 packs/day for 42.0 years (31.5 ttl pk-yrs)    Types: Cigarettes   Smokeless tobacco: Never   Tobacco comments:    hoping welbutrin will help  Vaping Use   Vaping status: Never Used  Substance and Sexual Activity   Alcohol  use: Not Currently    Alcohol /week: 6.0 standard drinks of alcohol     Comment: 01/08/2018 "stopped 06/2017"   Drug use: Not Currently    Types: "Crack" cocaine    Comment: 01/08/2018 "stopped 06/2017"   Sexual activity: Yes    Birth control/protection: Condom    Comment: pt. declined condoms  Other Topics Concern   Not on file  Social History Narrative   Not on file   Social Drivers of Health   Financial Resource Strain: Low Risk  (07/17/2023)   Overall Financial Resource Strain (CARDIA)    Difficulty of Paying Living Expenses: Not hard at all  Food Insecurity: No Food Insecurity (04/19/2024)   Hunger Vital Sign    Worried About Running Out of Food in the Last Year: Never true    Ran Out of Food in the Last Year: Never true  Transportation Needs: No Transportation Needs (04/19/2024)   PRAPARE - Administrator, Civil Service (Medical): No    Lack of Transportation (Non-Medical): No  Physical Activity: Insufficiently Active (07/17/2023)   Exercise Vital Sign    Days of Exercise per Week: 3 days    Minutes of Exercise per Session: 20 min  Stress: Stress Concern Present (07/17/2023)   [employer] of Occupational Health - Occupational Stress Questionnaire    Feeling of Stress : To some extent  Social Connections: Socially  Isolated (04/19/2024)   Social Connection and Isolation Panel [NHANES]    Frequency of Communication with Friends and Family: Once a week    Frequency of Social Gatherings with Friends and Family: Never    Attends Religious Services: More than 4 times per year    Active Member of [employer] or Organizations: No    Attends Banker Meetings: Never    Marital Status: Never married   Family History  Problem Relation Age of Onset   Glaucoma Mother    Mental illness Mother    Vision loss Mother    Hypertension Father    CAD Father    Mental illness Father  Alcohol  abuse Father    Drug abuse Father    Heart attack Father 47   Early death Father    Heart disease Father    Alcohol  abuse Brother    Drug abuse Brother    Drug abuse Brother    Scheduled Meds:  amiodarone   200 mg Oral Daily   aspirin  EC  81 mg Oral q AM   bictegravir-emtricitabine -tenofovir  AF  1 tablet Oral Daily   enoxaparin  (LOVENOX ) injection  40 mg Subcutaneous Q24H   feeding supplement  237 mL Oral BID BM   finasteride   5 mg Oral Daily   melatonin  10 mg Oral QHS   mexiletine  250 mg Oral Q12H   midodrine   30 mg Oral TID WC   mometasone -formoterol   2 puff Inhalation BID   sodium chloride  flush  3 mL Intravenous Q12H   tamsulosin   0.8 mg Oral BID   torsemide   20 mg Oral Daily   valACYclovir   500 mg Oral Daily   Continuous Infusions: PRN Meds:.acetaminophen  **OR** acetaminophen , albuterol , docusate sodium , LORazepam , morphine , ondansetron  (ZOFRAN ) IV, traZODone , trimethobenzamide  Medications Prior to Admission:  Prior to Admission medications   Medication Sig Start Date End Date Taking? Authorizing Provider  acetaminophen  (TYLENOL ) 325 MG tablet Take 2 tablets (650 mg total) by mouth every 6 (six) hours as needed for mild pain (pain score 1-3) (or Fever >/= 101). 03/07/24  Yes Barbee Lew, MD  amiodarone  (PACERONE ) 200 MG tablet Take 1 tablet (200 mg total) by mouth daily. 03/28/24  Yes Camnitz,  Will Gaylyn Keas, MD  aspirin  EC 81 MG tablet Take 81 mg by mouth in the morning. Swallow whole.   Yes [provider]  bictegravir-emtricitabine -tenofovir  AF (BIKTARVY ) 50-200-25 MG TABS tablet Take 1 tablet by mouth daily. Patient taking differently: Take 1 tablet by mouth in the morning. 08/15/23  Yes Ernie Heal, Jerelyn Money, MD  finasteride  (PROSCAR ) 5 MG tablet TAKE 1 TABLET(5 MG) BY MOUTH DAILY Patient taking differently: Take 5 mg by mouth in the morning. 03/15/23  Yes Vernell Goldsmith, MD  LORazepam  (ATIVAN ) 0.5 MG tablet Take 0.5 mg by mouth daily. 03/27/24  Yes [provider]  Melatonin 10 MG TABS Take 10 mg by mouth at bedtime.   Yes [provider]  mexiletine (MEXITIL ) 250 MG capsule Take 1 capsule (250 mg total) by mouth every 12 (twelve) hours. 02/08/24  Yes Debria Fang, PA-C  midodrine  (PROAMATINE ) 5 MG tablet Take 3 tablets (15 mg total) by mouth 3 (three) times daily. 03/07/24  Yes Barbee Lew, MD  ondansetron  (ZOFRAN ) 4 MG tablet Take 1 tablet (4 mg total) by mouth every 8 (eight) hours as needed for nausea or vomiting. 03/12/24  Yes Camnitz, Babetta Lesch, MD  oxyCODONE  (OXY IR/ROXICODONE ) 5 MG immediate release tablet Take 1 tablet (5 mg total) by mouth every 6 (six) hours as needed for severe pain (pain score 7-10). 03/22/24  Yes Arrien, Mauricio Daniel, MD  polyethylene glycol (MIRALAX  / GLYCOLAX ) 17 g packet Take 17 g by mouth daily. 02/09/24  Yes Debria Fang, PA-C  tamsulosin  (FLOMAX ) 0.4 MG CAPS capsule TAKE 2 CAPSULES(0.8 MG) BY MOUTH DAILY AFTER SUPPER Patient taking differently: Take 0.8 mg by mouth 2 (two) times daily. 07/26/23  Yes Vernell Goldsmith, MD  torsemide  (DEMADEX ) 20 MG tablet Take 2 tablets (40 mg total) by mouth daily. In case of weight gain 2 to 3 lbs in 24 hrs or 5 lbs in 7 days, take 3  tablets daily until weight back to baseline 03/23/24 05/06/24 Yes Arrien, Mauricio Daniel, MD  traZODone  (DESYREL ) 150 MG tablet Take 150 mg  by mouth at bedtime as needed for sleep. 04/09/24  Yes [provider]  triamcinolone  ointment (KENALOG ) 0.5 % Apply 1 Application topically 2 (two) times daily. 08/15/23  Yes Vernell Goldsmith, MD  valACYclovir  (VALTREX ) 500 MG tablet Take 1 tablet (500 mg total) by mouth daily. Patient taking differently: Take 500 mg by mouth in the morning. 11/15/22  Yes Vernell Goldsmith, MD  albuterol  (VENTOLIN  HFA) 108 [phone] Base) MCG/ACT inhaler Inhale 2 puffs into the lungs every 6 (six) hours as needed for wheezing or shortness of breath. Patient not taking: Reported on 04/20/2024 07/30/23   Vernell Goldsmith, MD  SYMBICORT 80-4.5 MCG/ACT inhaler Inhale 2 puffs into the lungs in the morning and at bedtime. Patient not taking: Reported on 04/20/2024 03/06/24   [provider]   No Known Allergies Review of Systems + episodic chest pain, none at present.   Physical Exam Awake alert No distress Resting in bed Regular work of breathing  Vital Signs: BP 95/74 (BP Location: Left Arm)   Pulse 93   Temp 97.6 F (36.4 C)   Resp 16   Ht 5\' 8"  (1.727 m)   Wt 69.2 kg   SpO2 98%   BMI 23.20 kg/m  Pain Scale: 0-10   Pain Score: 2    SpO2: SpO2: 98 % O2 Device:SpO2: 98 % O2 Flow Rate: .O2 Flow Rate (L/min): 2 L/min  IO: Intake/output summary:  Intake/Output Summary (Last 24 hours) at 04/21/2024 1026 Last data filed at 04/21/2024 0900 Gross per 24 hour  Intake 1280 ml  Output --  Net 1280 ml    LBM: Last BM Date : 04/20/24 (per pt had a BM today) Baseline Weight: Weight: 69.4 kg Most recent weight: Weight: 69.2 kg     Palliative Assessment/Data:   PPS 50%  Time In:  9 Time Out: 10  Time Total:  60  Greater than 50%  of this time was spent counseling and coordinating care related to the above assessment and plan.  Signed by: Lujean Sake, MD   Please contact Palliative Medicine Team phone at [phone] for questions and concerns.  For individual provider: See  Tilford Foley

## 2024-04-21 NOTE — TOC Initial Note (Signed)
 Transition of Care [facility]) - Initial/Assessment Note    Patient Details  Name: Ryan Long MRN: [id number] Date of Birth: [birth year]  Transition of Care [facility]) CM/SW Contact:    Gertha Ku, LCSW Phone Number: 04/21/2024, 3:18 PM  Clinical Narrative:                 CSW met with the pt regarding his request to switch hospice agencies. The pt expressed a desire to switch from [facility] to [facility]. He reports living alone at home and inquired about aide services that could assist him at home. CSW explained that the pt has Medicaid and may qualify for personal care services. CSW informed the pt that he will need to work with his PCP to obtain these services.  CSW also inquired if the pt could pay out-of-pocket for additional care. The pt asked about the cost, and CSW explained that a referral to [facility] could be made for them to discuss different rates. The pt agreed for CSW to make the referral.  CSW sent a hospice referral to the [facility] liaison, Melissa, for review for  home hospice services. TOC to follow.  Expected Discharge Plan: Home w Hospice Care Barriers to Discharge: Continued Medical Work up   Patient Goals and CMS Choice Patient states their goals for this hospitalization and ongoing recovery are:: retrun home          Expected Discharge Plan and Services       Living arrangements for the past 2 months: Single Family Home                                      Prior Living Arrangements/Services Living arrangements for the past 2 months: Single Family Home Lives with:: Self Patient language and need for interpreter reviewed:: Yes Do you feel safe going back to the place where you live?: Yes      Need for Family Participation in Patient Care: No (Comment) Care giver support system in place?: No (comment)   Criminal Activity/Legal Involvement Pertinent to Current Situation/Hospitalization: No - Comment as  needed  Activities of Daily Living   ADL Screening (condition at time of admission) Independently performs ADLs?: No Does the patient have a NEW difficulty with bathing/dressing/toileting/self-feeding that is expected to last >3 days?: Yes (Initiates electronic notice to provider for possible OT consult) Does the patient have a NEW difficulty with getting in/out of bed, walking, or climbing stairs that is expected to last >3 days?: No Does the patient have a NEW difficulty with communication that is expected to last >3 days?: No Is the patient deaf or have difficulty hearing?: No Does the patient have difficulty seeing, even when wearing glasses/contacts?: No Does the patient have difficulty concentrating, remembering, or making decisions?: No  Permission Sought/Granted                  Emotional Assessment Appearance:: Appears stated age Attitude/Demeanor/Rapport: Gracious Affect (typically observed): Accepting Orientation: : Oriented to Self, Oriented to Place, Oriented to  Time, Oriented to Situation   Psych Involvement: No (comment)  Admission diagnosis:  Hypokalemia [E87.6] Nonspecific chest pain [R07.9] Patient Active Problem List   Diagnosis Date Noted   Hypokalemia 04/19/2024   Hypotension 04/19/2024   Debility 04/19/2024   Musculoskeletal pain 03/22/2024   Acute on chronic heart failure (HCC) 03/18/2024   Chronic kidney disease, stage 3a (HCC) 02/22/2024  Acute on chronic HFrEF (heart failure with reduced ejection fraction) (HCC) 02/17/2024   Chest pain 02/17/2024   Ventricular tachycardia (HCC) 01/23/2024   Acute on chronic systolic CHF (congestive heart failure) (HCC) 01/03/2024   Asthmatic bronchitis 01/24/2023   Rash and nonspecific skin eruption 12/19/2022   Arm numbness 11/15/2022   Aortic atherosclerosis (HCC) 11/15/2022   Chronic kidney disease 08/08/2022   Goals of care, counseling/discussion 08/08/2022   Cigarette smoker 01/26/2022   ETD  (Eustachian tube dysfunction), right 01/26/2022   Allergy to pollen 01/26/2022   Polycythemia 12/08/2021   Vitamin D deficiency 12/08/2021   Hip pain 01/06/2021   VT (ventricular tachycardia) (HCC) 08/25/2020   HIV positive (HCC) 07/12/2020   Paroxysmal atrial fibrillation (HCC) 06/10/2020   BPH (benign prostatic hyperplasia) 01/28/2020   HFrEF (heart failure with reduced ejection fraction) (HCC) 01/28/2020   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   Achilles tendinitis, left leg 04/03/2019   AICD (automatic cardioverter/defibrillator) present 08/15/2018   Abnormal EKG 06/28/2018   Adjustment disorder with mixed disturbance of emotions and conduct    NICM (nonischemic cardiomyopathy) (HCC) 01/08/2018   Anxiety 12/20/2017   Bipolar 1 disorder (HCC) 10/10/2017   End-stage systolic heart failure, chronic (HCC) 05/13/2017   HSV-2 infection 07/17/2016   History of attempted suicide 04/14/2016   History of alcoholism (HCC) 04/14/2016   History of substance abuse (HCC) 04/14/2016   Constipation 01/17/2016   COPD (chronic obstructive pulmonary disease) (HCC) 10/04/2015   Abnormal thyroid  stimulating hormone (TSH) level 10/04/2015   MDD (major depressive disorder), recurrent episode, severe (HCC) 09/23/2015   Hypertension 09/10/2015   PTSD (post-traumatic stress disorder) 08/07/2015   Primary insomnia 04/02/2015   Herpesviral infection of penis 04/02/2015   Depression with suicidal ideation 07/03/2014   Generalized anxiety disorder 05/29/2014   Tobacco use disorder 02/17/2013   Heart failure (HCC) 09/19/2011   Other primary cardiomyopathies 08/24/2011   HIV (human immunodeficiency virus infection) (HCC) 02/01/2009   Recurrent HSV (herpes simplex virus) 02/01/2009   Hyperlipidemia 02/01/2009   Allergic rhinitis 02/01/2009   PCP:  Tretha Fu, MD Pharmacy:   [id number] [address] Nestle,  - [address] AT [facility] [address] [place]  [address] Phone: [phone] Fax: [phone]  [facility] [address] [place] [address] Phone: [phone] Fax: [phone]     Social Drivers of Health (SDOH) Social History: SDOH Screenings   Food Insecurity: No Food Insecurity (04/19/2024)  Housing: Low Risk  (04/19/2024)  Transportation Needs: No Transportation Needs (04/19/2024)  Utilities: Not At Risk (04/19/2024)  Alcohol  Screen: Low Risk  (07/17/2023)  Depression (PHQ2-9): Low Risk  (08/01/2023)  Financial Resource Strain: Low Risk  (07/17/2023)  Physical Activity: Insufficiently Active (07/17/2023)  Social Connections: Socially Isolated (04/19/2024)  Stress: Stress Concern Present (07/17/2023)  Tobacco Use: High Risk (04/19/2024)  Health Literacy: Adequate Health Literacy (07/18/2023)   SDOH Interventions:     Readmission Risk Interventions    03/19/2024    3:54 PM  Readmission Risk Prevention Plan  Transportation Screening Complete  PCP or Specialist Appt within 3-5 Days Complete  HRI or Home Care Consult Complete  Medication Review (RN Care Manager) Complete

## 2024-04-21 NOTE — Progress Notes (Signed)
 PT Cancellation Note  Patient Details Name: Ryan Long MRN: [id number] DOB: 11/06/1961   Cancelled Treatment:    Reason Eval/Treat Not Completed: Patient declined, no reason specified-resting at this time. Requested PT check back another time. Will check back as schedule allows.    Tanda Falter, PT Acute Rehabilitation  Office: [phone]

## 2024-04-21 NOTE — Progress Notes (Signed)
 PT Cancellation Note  Patient Details Name: Ryan Long MRN: [id number] DOB: 03-Mar-1961   Cancelled Treatment:    Reason Eval/Treat Not Completed:  Attempted PT tx session-pt reports BP has been low-not getting fluids. He politely declined PT participation on today. Will check back on tomorrow.    Tanda Falter, PT Acute Rehabilitation  Office: [phone]

## 2024-04-21 NOTE — Progress Notes (Signed)
 PROGRESS NOTE    Ryan Long  ZOX:[id number] DOB: [birth year] DOA: 04/19/2024 PCP: Tretha Fu, MD    Brief Narrative:   Ryan Long is a 63 y.o. male with past medical history significant for chronic combined/diastolic CHF/end-stage cardiomyopathy status post AICD (LVEF 20-25% with G1DD ), NSVT, HIV (CD4 count 429, undetectable VL 04/14/2024) , hypertension, hyperlipidemia, COPD, anxiety, depression, bipolar disorder, substance abuse, BPH, CKD stage IIIa (Cr 1.5 - 1.6) currently on home hospice who presented to [facility] ED on 04/19/2024 with complaint of chest pain, dizziness, headache, shortness of breath.  In the ED, temperature 97.8 F, HR 83, RR 18, BP 94-66, SpO2 99% on room air.  WBC 2.8, hemoglobin 16.3, platelet count 144.  Sodium 140, potassium 2.4, chloride 97, CO2 32, glucose 128, BUN 19, creatinine 1.55.  High sensitive troponin 12>16.  BNP 1490.  UDS negative.  Chest x-ray with no active cardiopulmonary disease process.  TRH consulted for admission.  Assessment & Plan:    End-stage systolic heart failure, chronic  Patient presenting with symptoms of intermittent lightheadedness chest discomfort and shortness of breath consistent with end-stage congestive heart failure. Patient is already on hospice for his advanced disease with a [facility]; prognosis is poor and ICD was shut off earlier this year.  Patient euvolemic on physical exam with total loss of 45 pounds over the last year. -- Palliative care following, appreciate assistance -- Unlikely candidate for residential hospice at this point but would benefit from discharge to ALF versus SNF with ongoing hospice services. -- Midodrine  30 mg p.o. 3 times daily -- Morphine  2.5 mg p.o. every 2 hours as needed severe pain  Chest pain Chest pain thought to be secondary to progressive symptoms of end-stage heart failure.  Troponins within normal limits. --Supportive care  Hypokalemia Repleted.   Repeat potassium 3.5 today, will continue repletion. -- Repeat BMP in am   HIV (human immunodeficiency virus infection)  -- Continue Biktarvy  -- Continue Valtrex  due to history of recurrent HSV -- Outpatient ID follow-up with infectious disease as needed  Paroxysmal atrial fibrillation  -- Continue mexiletine 250 mg p.o. every 12 hours and amiodarone  200 mg p.o. daily -- not on anticoagulation  Hx Ventricular tachycardia  History of admission for VT storm earlier in the year; ICD now turned off. -- Continue mexiletine 250 mg p.o. every 12 hours and amiodarone  200 mg p.o. daily  COPD (chronic obstructive pulmonary disease); not in acute exacerbation -- Dulera  2 puffs twice daily -- Albuterol  neb every 6 hours.  Wheezing/shortness of breath   Chronic kidney disease, stage 3a  Baseline creatinine 1.5-1.6. -- Cr 1.40>1.23; stable -- Avoid nephrotoxins, renally dose all medications   BPH (benign prostatic hyperplasia) -- Finasteride  5 mg p.o. daily -- Tamsulosin  0.8 mg p.o. twice daily  Weakness/debility/deconditioning: -- PT/OT evaluation  DVT prophylaxis: enoxaparin  (LOVENOX ) injection 40 mg Start: 04/19/24 2200    Code Status: Limited: Do not attempt resuscitation (DNR) -DNR-LIMITED -Do Not Intubate/DNI  Family Communication: No family present bedside this morning  Disposition Plan:  Level of care: Telemetry Status is: Observation The patient remains OBS appropriate and will d/c before 2 midnights.    Consultants:  Palliative care  Procedures:  none  Antimicrobials:  none   Subjective: Patient seen examined bedside, lying in bed.  Complaining of some mild dizziness.  Seen by palliative care this morning.  Shortness of breath resolved.  No other specific questions, concerns or complaints at this time.  Denies headache, no visual  changes, no current chest pain, no shortness of breath, no abdominal pain, no fever/chills/night sweats, no nausea/vomitus or diarrhea.  No  acute events overnight per nurse staff.  Objective: Vitals:   04/21/24 0553 04/21/24 1348 04/21/24 1400 04/21/24 1430  BP: 95/74 (!) 83/59 (!) 84/66 93/66  Pulse: 93 96  89  Resp: 16 20    Temp: 97.6 F (36.4 C) 97.6 F (36.4 C)    TempSrc:  Oral    SpO2: 98% 100% 99%   Weight:      Height:        Intake/Output Summary (Last 24 hours) at 04/21/2024 1543 Last data filed at 04/21/2024 1500 Gross per 24 hour  Intake 1332.16 ml  Output --  Net 1332.16 ml   Filed Weights   04/19/24 1503 04/19/24 1813  Weight: 69.4 kg 69.2 kg    Examination:  Physical Exam: GEN: NAD, alert and oriented x 3, chronically ill appearance HEENT: NCAT, PERRL, EOMI, sclera clear, MMM PULM: CTAB w/o wheezes/crackles, normal respiratory effort, on room air CV: RRR w/o M/G/R GI: abd soft, NTND, NABS, no R/G/M MSK: no peripheral edema, muscle strength globally intact 5/5 bilateral upper/lower extremities NEURO: CN II-XII intact, no focal deficits, sensation to light touch intact PSYCH: normal mood/affect Integumentary: dry/intact, no rashes or wounds    Data Reviewed: I have personally reviewed following labs and imaging studies  CBC: Recent Labs  Lab 04/19/24 1513 04/20/24 0414  WBC 2.8* 2.7*  HGB 16.3 15.6  HCT 46.9 46.2  MCV 96.3 98.7  PLT 144* 123*   Basic Metabolic Panel: Recent Labs  Lab 04/19/24 1513 04/20/24 0414 04/21/24 0402  NA 140 138 144  K 2.4* 3.2* 3.5  CL 97* 102 104  CO2 32 29 30  GLUCOSE 128* 104* 94  BUN 19 17 13   CREATININE 1.55* 1.40* 1.23  CALCIUM  8.0* 8.4* 8.9  MG  --  2.1 1.8  PHOS  --   --  3.8   GFR: Estimated Creatinine Clearance: 60.2 mL/min (by C-G formula based on SCr of 1.23 mg/dL). Liver Function Tests: Recent Labs  Lab 04/21/24 0402  ALBUMIN  3.4*   No results for input(s): "LIPASE", "AMYLASE" in the last 168 hours. No results for input(s): "AMMONIA" in the last 168 hours. Coagulation Profile: No results for input(s): "INR", "PROTIME" in  the last 168 hours. Cardiac Enzymes: No results for input(s): "CKTOTAL", "CKMB", "CKMBINDEX", "TROPONINI" in the last 168 hours. BNP (last 3 results) No results for input(s): "PROBNP" in the last 8760 hours. HbA1C: No results for input(s): "HGBA1C" in the last 72 hours. CBG: No results for input(s): "GLUCAP" in the last 168 hours. Lipid Profile: No results for input(s): "CHOL", "HDL", "LDLCALC", "TRIG", "CHOLHDL", "LDLDIRECT" in the last 72 hours. Thyroid  Function Tests: No results for input(s): "TSH", "T4TOTAL", "FREET4", "T3FREE", "THYROIDAB" in the last 72 hours. Anemia Panel: No results for input(s): "VITAMINB12", "FOLATE", "FERRITIN", "TIBC", "IRON", "RETICCTPCT" in the last 72 hours. Sepsis Labs: No results for input(s): "PROCALCITON", "LATICACIDVEN" in the last 168 hours.  Recent Results (from the past 240 hours)  C. trachomatis/N. gonorrhoeae RNA     Status: None   Collection Time: 04/14/24  2:36 AM   Specimen: Urine  Result Value Ref Range Status   C. trachomatis RNA, TMA NOT DETECTED NOT DETECTED Final   N. gonorrhoeae RNA, TMA NOT DETECTED NOT DETECTED Final    Comment: The analytical performance characteristics of this assay, when used to test SurePath(TM) specimens have been determined by [facility]. The modifications have not been cleared or approved by the FDA. This assay has been validated pursuant to the CLIA regulations and is used for clinical purposes. . For additional information, please refer to https://education.[facility].com/faq/FAQ154 (This link is being provided for information/ educational purposes only.) .          Radiology Studies: No results found.      Scheduled Meds:  amiodarone   200 mg Oral Daily   aspirin  EC  81 mg Oral q AM   bictegravir-emtricitabine -tenofovir  AF  1 tablet Oral Daily   enoxaparin  (LOVENOX ) injection  40 mg Subcutaneous Q24H   feeding supplement  237 mL Oral BID BM   finasteride   5 mg Oral  Daily   melatonin  10 mg Oral QHS   mexiletine  250 mg Oral Q12H   midodrine   30 mg Oral TID WC   mometasone -formoterol   2 puff Inhalation BID   sodium chloride  flush  3 mL Intravenous Q12H   tamsulosin   0.8 mg Oral BID   torsemide   20 mg Oral Daily   valACYclovir   500 mg Oral Daily   Continuous Infusions:   LOS: 0 days    Time spent: 51 minutes spent on 04/21/2024 caring for this patient face-to-face including chart review, ordering labs/tests, documenting, discussion with nursing staff, consultants, updating family and interview/physical exam    Rema [facility] [place], DO [facility] Available via Epic secure chat 7am-7pm After these hours, please refer to coverage provider listed on [email or web address] 04/21/2024, 3:43 PM

## 2024-04-21 NOTE — Evaluation (Addendum)
 Occupational Therapy Evaluation Patient Details Name: Ryan Long MRN: [id number] DOB: May 27, 1961 Today's Date: 04/21/2024   History of Present Illness   63 yo male admitted with hypokalemia, chest pain, acute on chronic HF. Hx of bipolar d/o, PTSD, HIV, CKD, COPD, depression, anxiety, HF, NSVT, ICD     Clinical Impressions PTA, patient was living in group home with stairs and relatively mod I with a rollator for BADL's and mobility but recent falls. Patient reports has limited support for groceries or laundry. Patient was ordered home hospice services after last hospitalization with shower seat recommended but patient reports seat not delivered. Currently patient presents with low BP with headache pain, LB strength deficits, limited activity tolerance impacting ADL/IADL and mobility safety and performance. OT will continue to follow acutely with training with tub transfer to seat in addition to outlined POC to improve functional safety. Patient will require support upon discharge home as per MD and team goals of care.      If plan is discharge home, recommend the following:   A lot of help with bathing/dressing/bathroom;Assistance with cooking/housework;Assist for transportation;Help with stairs or ramp for entrance     Functional Status Assessment   Patient has had a recent decline in their functional status and demonstrates the ability to make significant improvements in function in a reasonable and predictable amount of time.     Equipment Recommendations   Tub/shower seat      Precautions/Restrictions   Precautions Precaution/Restrictions Comments: monitor BP Restrictions Weight Bearing Restrictions Per Provider Order: No     Mobility Bed Mobility Overal bed mobility: Modified Independent                  Transfers Overall transfer level: Needs assistance Equipment used: Rolling walker (2 wheels) Transfers: Sit to/from Stand, Bed to  chair/wheelchair/BSC Sit to Stand: Supervision     Step pivot transfers: Supervision     General transfer comment: was in bathroom upon OT arrival without RW knowing BP was low today and sympromatic with HA      Balance Overall balance assessment: History of Falls, Needs assistance Sitting-balance support: No upper extremity supported, Feet supported Sitting balance-Leahy Scale: Good     Standing balance support: During functional activity, Reliant on assistive device for balance Standing balance-Leahy Scale: Good                             ADL either performed or assessed with clinical judgement   ADL Overall ADL's : Needs assistance/impaired Eating/Feeding: Sitting;Modified independent   Grooming: Wash/dry hands;Wash/dry face;Oral care;Independent;Sitting   Upper Body Bathing: Sitting;Modified independent   Lower Body Bathing: Supervison/ safety;Sitting/lateral leans;Sit to/from stand   Upper Body Dressing : Modified independent;Sitting   Lower Body Dressing: Supervision/safety;Sitting/lateral leans;Sit to/from stand   Toilet Transfer: Supervision/safety;Rolling walker (2 wheels);Regular Toilet   Toileting- Clothing Manipulation and Hygiene: Supervision/safety;Sitting/lateral lean;Sit to/from stand   Tub/ Shower Transfer: Contact guard assist;Grab bars   Functional mobility during ADLs: Rolling walker (2 wheels);Supervision/safety (low BP this session thus S for safety) General ADL Comments: min cues for figue 4 techniques, cues to avoid ending and to use RW at all times     Vision Baseline Vision/History: 1 Wears glasses;0 No visual deficits Ability to See in Adequate Light: 0 Adequate Patient Visual Report: No change from baseline Vision Assessment?: No apparent visual deficits;Wears glasses for reading     Perception Perception: Within Functional Limits  Praxis Praxis: [facility]       Pertinent Vitals/Pain Pain Assessment Pain Assessment:  Faces Faces Pain Scale: Hurts a little bit Pain Location: headache (headache) Pain Descriptors / Indicators: Aching Pain Intervention(s): Monitored during session, Repositioned, Patient requesting pain meds-RN notified, Relaxation     Extremity/Trunk Assessment Upper Extremity Assessment Upper Extremity Assessment: Overall WFL for tasks assessed;Right hand dominant   Lower Extremity Assessment Lower Extremity Assessment: Generalized weakness   Cervical / Trunk Assessment Cervical / Trunk Assessment: Normal   Communication Communication Communication: No apparent difficulties   Cognition Arousal: Alert Behavior During Therapy: WFL for tasks assessed/performed               OT - Cognition Comments: refusing to allow bed alarm                 Following commands: Intact       Cueing  General Comments   Cueing Techniques: Verbal cues  BP 84/66 after BM amb back to EOB, BP rose to 94/68 with rest and LE elevation           Home Living Family/patient expects to be discharged to:: Other (Comment) Living Arrangements: Alone (boarding house) Available Help at Discharge: Friend(s) Type of Home: House Home Access: Stairs to enter Secretary/administrator of Steps: 7 Entrance Stairs-Rails: Can reach both;Right;Left Home Layout: Able to live on main level with bedroom/bathroom     Bathroom Shower/Tub: Chief Strategy Officer: Standard     Home Equipment: Rollator (4 wheels)   Additional Comments: lives in boarding house, reports hopsice was to deliver a shower chair but did not      Prior Functioning/Environment Prior Level of Function : Independent/Modified Independent             Mobility Comments: ambulating short distances ADLs Comments: Drives a "gas scooter" to get groceries, laundry at the group home but FF of stairs, has microwave in room    OT Problem List: Decreased strength;Decreased activity tolerance;Decreased knowledge of  precautions;Cardiopulmonary status limiting activity;Pain   OT Treatment/Interventions: Self-care/ADL training;Therapeutic exercise;Energy conservation;DME and/or AE instruction;Therapeutic activities;Patient/family education      OT Goals(Current goals can be found in the care plan section)   Acute Rehab OT Goals Patient Stated Goal: to feel stronger OT Goal Formulation: With patient Time For Goal Achievement: 05/05/24 Potential to Achieve Goals: Fair ADL Goals Pt Will Perform Lower Body Bathing: with modified independence;sit to/from stand;sitting/lateral leans Pt Will Perform Lower Body Dressing: with modified independence;sitting/lateral leans;sit to/from stand Pt Will Transfer to Toilet: with modified independence;grab bars;ambulating Pt Will Perform Tub/Shower Transfer: with modified independence;shower seat;ambulating Pt/caregiver will Perform Home Exercise Program: Both right and left upper extremity;With written HEP provided;Independently;Increased strength   OT Frequency:  Min 2X/week    AM-PAC OT "6 Clicks" Daily Activity     Outcome Measure Help from another person eating meals?: None Help from another person taking care of personal grooming?: None Help from another person toileting, which includes using toliet, bedpan, or urinal?: A Little Help from another person bathing (including washing, rinsing, drying)?: A Little Help from another person to put on and taking off regular upper body clothing?: None Help from another person to put on and taking off regular lower body clothing?: A Little 6 Click Score: 21   End of Session Equipment Utilized During Treatment: Gait belt;Rolling walker (2 wheels) Nurse Communication: Mobility status;Other (comment) (HA and both present for vitals and reported patient refused bed exit)  Activity Tolerance:  Patient tolerated treatment well Patient left: in bed;with call bell/phone within reach;with nursing/sitter in room (refused bed  exit)  OT Visit Diagnosis: Unsteadiness on feet (R26.81);Muscle weakness (generalized) (M62.81);History of falling (Z91.81);Pain                Time: 1610-9604 OT Time Calculation (min): 33 min Charges:  OT General Charges $OT Visit: 1 Visit OT Evaluation $OT Eval Low Complexity: 1 Low OT Treatments $Self Care/Home Management : 8-22 mins  Mylasia Vorhees OT/L Acute Rehabilitation Department  [phone]  04/21/2024, 3:49 PM

## 2024-04-22 DIAGNOSIS — E876 Hypokalemia: Secondary | ICD-10-CM | POA: Diagnosis not present

## 2024-04-22 DIAGNOSIS — E440 Moderate protein-calorie malnutrition: Secondary | ICD-10-CM | POA: Insufficient documentation

## 2024-04-22 LAB — BASIC METABOLIC PANEL WITH GFR
Anion gap: 9 (ref 5–15)
BUN: 15 mg/dL (ref 8–23)
CO2: 25 mmol/L (ref 22–32)
Calcium: 8.4 mg/dL — ABNORMAL LOW (ref 8.9–10.3)
Chloride: 104 mmol/L (ref 98–111)
Creatinine, Ser: 1.45 mg/dL — ABNORMAL HIGH (ref 0.61–1.24)
GFR, Estimated: 54 mL/min — ABNORMAL LOW (ref >60)
Glucose, Bld: 96 mg/dL (ref 70–99)
Potassium: 3.1 mmol/L — ABNORMAL LOW (ref 3.5–5.1)
Sodium: 138 mmol/L (ref 135–145)

## 2024-04-22 LAB — MAGNESIUM: Magnesium: 1.7 mg/dL (ref 1.7–2.4)

## 2024-04-22 MED ORDER — MAGNESIUM SULFATE 2 GM/50ML IV SOLN
2.0000 g | Freq: Once | INTRAVENOUS | Status: AC
  Administered 2024-04-22: 2 g via INTRAVENOUS
  Filled 2024-04-22: qty 50

## 2024-04-22 MED ORDER — POTASSIUM CHLORIDE CRYS ER 20 MEQ PO TBCR
30.0000 meq | EXTENDED_RELEASE_TABLET | ORAL | Status: AC
  Administered 2024-04-22 (×3): 30 meq via ORAL
  Filled 2024-04-22 (×4): qty 1

## 2024-04-22 MED ORDER — LORAZEPAM 0.5 MG PO TABS
0.5000 mg | ORAL_TABLET | ORAL | Status: DC | PRN
  Administered 2024-04-22 – 2024-05-09 (×63): 0.5 mg via ORAL
  Filled 2024-04-22 (×65): qty 1

## 2024-04-22 MED ORDER — CARMEX CLASSIC LIP BALM EX OINT
TOPICAL_OINTMENT | CUTANEOUS | Status: DC | PRN
  Filled 2024-04-22: qty 10

## 2024-04-22 NOTE — Plan of Care (Signed)

## 2024-04-22 NOTE — Progress Notes (Signed)
 Initial Nutrition Assessment  DOCUMENTATION CODES:   Non-severe (moderate) malnutrition in context of chronic illness  INTERVENTION:  - Heart Diet with fluid restriction per MD.  - Ensure Plus High Protein po BID, each supplement provides 350 kcal and 20 grams of protein. - Magic cup BID with meals, each supplement provides 290 kcal and 9 grams of protein - Encourage intake as tolerated.  - Monitor weight trends.   NUTRITION DIAGNOSIS:   Moderate Malnutrition related to chronic illness (CHF; COPD; HIV; CKD 3a) as evidenced by mild fat depletion, moderate muscle depletion, percent weight loss (23% in 4 months).  GOAL:   Patient will meet greater than or equal to 90% of their needs  MONITOR:   PO intake, Supplement acceptance, Labs, Weight trends  REASON FOR ASSESSMENT:   Malnutrition Screening Tool    ASSESSMENT:   64 y.o. male with PMH significant for chronic combined/diastolic CHF/end-stage cardiomyopathy, HIV, HTN, HLD, COPD, anxiety, depression, bipolar disorder, substance abuse, BPH, CKD stage IIIa currently on home hospice who presented with complaint of chest pain, dizziness, headache, shortness of breath.   Patient reports a UBW fo 200# and weight loss since February due to depression and a lot of medical problems.  Per EMR, patient has lost 46# or 23% weight loss in the past 4 months, which is significant and severe for the time frame.   He admits to only eating mostly applesauce and soup PTA. Mostly snacks and not full meals. Has been consuming Ensure 1-2 times per day.   Thankfully, his current appetite is better and he has been eating well since admission. He is documented to be consuming 50-100% of meals. Ate great at breakfast this AM. He has also been drinking Ensure and is agreeable to also receive [employer].   Medications reviewed and include: -  Labs reviewed:  K+ 3.1 Creatinine 1.45   NUTRITION - FOCUSED PHYSICAL EXAM:  Flowsheet Row Most  Recent Value  Orbital Region Mild depletion  Upper Arm Region Moderate depletion  Thoracic and Lumbar Region Mild depletion  Buccal Region Mild depletion  Temple Region Moderate depletion  Clavicle Bone Region Mild depletion  Clavicle and Acromion Bone Region Moderate depletion  Scapular Bone Region Unable to assess  Dorsal Hand Mild depletion  Patellar Region Mild depletion  Anterior Thigh Region Mild depletion  Posterior Calf Region Mild depletion  Edema (RD Assessment) None  Hair Reviewed  Eyes Reviewed  Mouth Reviewed  Skin Reviewed  Nails Reviewed       Diet Order:   Diet Order             Diet Heart Room service appropriate? Yes; Fluid consistency: Thin; Fluid restriction: 1200 mL Fluid  Diet effective now                   EDUCATION NEEDS:  Education needs have been addressed  Skin:  Skin Assessment: Reviewed RN Assessment  Last BM:  5/5 - type 4  Height:  Ht Readings from Last 1 Encounters:  04/19/24 5\' 8"  (1.727 m)   Weight:  Wt Readings from Last 1 Encounters:  04/19/24 69.2 kg    BMI:  Body mass index is 23.2 kg/m.  Estimated Nutritional Needs:  Kcal:  2100-2400 kcals Protein:  105-120 grams Fluid:  >/= 2.1L    Scheryl Cushing RD, LDN Contact via Secure Chat.

## 2024-04-22 NOTE — Progress Notes (Signed)
 Physical Therapy Treatment/PT Discharge Note Patient Details Name: Ryan Long MRN: [id number] DOB: 11-Dec-1961 Today's Date: 04/22/2024   History of Present Illness 63 yo male admitted with hypokalemia, chest pain, acute on chronic HF. Hx of bipolar d/o, PTSD, HIV, CKD, COPD, depression, anxiety, HF, NSVT, ICD    PT Comments  Pt agreeable to working with therapy. Tolerated session well. Mod Ind with mobility. Pt is requesting a shower chair for home. Recommend daily hallway ambulation with mobility team and/or nursing as able. Will d/c pt from caseload and request mobility team to follow.    If plan is discharge home, recommend the following:     Can travel by private vehicle        Equipment Recommendations   (pt requesting shower chair)    Recommendations for Other Services       Precautions / Restrictions Precautions Precaution/Restrictions Comments: BP runs low Restrictions Weight Bearing Restrictions Per Provider Order: No     Mobility  Bed Mobility Overal bed mobility: Modified Independent                  Transfers Overall transfer level: Modified independent                      Ambulation/Gait Ambulation/Gait assistance: Modified independent (Device/Increase time) Gait Distance (Feet): 450 Feet Assistive device: Rolling walker (2 wheels)         General Gait Details: walked in room to/from bathroom without a device. walked unit with RW. no lob. tolerated well.   Stairs             Wheelchair Mobility     Tilt Bed    Modified Rankin (Stroke Patients Only)       Balance Overall balance assessment: History of Falls, Needs assistance         Standing balance support: During functional activity, Bilateral upper extremity supported Standing balance-Leahy Scale: Good                              Communication Communication Communication: No apparent difficulties  Cognition Arousal: Alert Behavior  During Therapy: WFL for tasks assessed/performed   PT - Cognitive impairments: No apparent impairments                         Following commands: Intact      Cueing Cueing Techniques: Verbal cues  Exercises      General Comments        Pertinent Vitals/Pain Pain Assessment Pain Assessment: Faces Faces Pain Scale: Hurts a little bit Pain Location: headache Pain Intervention(s): Monitored during session    Home Living                          Prior Function            PT Goals (current goals can now be found in the care plan section) Progress towards PT goals: Goals met/education completed, patient discharged from PT    Frequency           PT Plan      Co-evaluation              AM-PAC PT "6 Clicks" Mobility   Outcome Measure  Help needed turning from your back to your side while in a flat bed without using bedrails?: None Help needed moving from lying on  your back to sitting on the side of a flat bed without using bedrails?: None Help needed moving to and from a bed to a chair (including a wheelchair)?: None Help needed standing up from a chair using your arms (e.g., wheelchair or bedside chair)?: None Help needed to walk in hospital room?: None Help needed climbing 3-5 steps with a railing? : A Little 6 Click Score: 23    End of Session   Activity Tolerance: Patient tolerated treatment well Patient left: in bed;with call bell/phone within reach         Time: 1115-1130 PT Time Calculation (min) (ACUTE ONLY): 15 min  Charges:    $Gait Training: 8-22 mins PT General Charges $$ ACUTE PT VISIT: 1 Visit                        Tanda Falter, PT Acute Rehabilitation  Office: [phone]

## 2024-04-22 NOTE — Progress Notes (Signed)
 PROGRESS NOTE    Ryan Long  WGN:[id number] DOB: 05-13-1961 DOA: 04/19/2024 PCP: Tretha Fu, MD    Brief Narrative:   Patrice Ellers is a 63 y.o. male with past medical history significant for chronic combined/diastolic CHF/end-stage cardiomyopathy status post AICD (LVEF 20-25% with G1DD ), NSVT, HIV (CD4 count 429, undetectable VL 04/14/2024) , hypertension, hyperlipidemia, COPD, anxiety, depression, bipolar disorder, substance abuse, BPH, CKD stage IIIa (Cr 1.5 - 1.6) currently on home hospice who presented to [facility] ED on 04/19/2024 with complaint of chest pain, dizziness, headache, shortness of breath.  In the ED, temperature 97.8 F, HR 83, RR 18, BP 94-66, SpO2 99% on room air.  WBC 2.8, hemoglobin 16.3, platelet count 144.  Sodium 140, potassium 2.4, chloride 97, CO2 32, glucose 128, BUN 19, creatinine 1.55.  High sensitive troponin 12>16.  BNP 1490.  UDS negative.  Chest x-ray with no active cardiopulmonary disease process.  TRH consulted for admission.  Assessment & Plan:    End-stage systolic heart failure, chronic  Patient presenting with symptoms of intermittent lightheadedness chest discomfort and shortness of breath consistent with end-stage congestive heart failure. Patient is already on hospice for his advanced disease with a [facility]; prognosis is poor and ICD was shut off earlier this year.  Patient euvolemic on physical exam with total loss of 45 pounds over the last year. -- Palliative care following, appreciate assistance -- Unlikely candidate for residential hospice at this point but would benefit from discharge to ALF vs SNF with ongoing hospice services; but may need to discharge home with change in hospice services to [facility] -- Midodrine  30 mg p.o. 3 times daily -- Morphine  2.5 mg p.o. every 2 hours as needed severe pain  Chest pain Chest pain thought to be secondary to progressive symptoms of end-stage heart failure.   Troponins within normal limits. --Supportive care  Hypokalemia Repleted.  Repeat potassium 3.1 today, will continue repletion. -- Repeat BMP in am -- may need to send on home supplementation   HIV (human immunodeficiency virus infection)  -- Continue Biktarvy  -- Continue Valtrex  due to history of recurrent HSV -- Outpatient ID follow-up with infectious disease as needed  Paroxysmal atrial fibrillation  -- Continue mexiletine 250 mg p.o. every 12 hours and amiodarone  200 mg p.o. daily -- not on anticoagulation  Hx Ventricular tachycardia  History of admission for VT storm earlier in the year; ICD now turned off. -- Continue mexiletine 250 mg p.o. every 12 hours and amiodarone  200 mg p.o. daily  COPD (chronic obstructive pulmonary disease); not in acute exacerbation -- Dulera  2 puffs twice daily -- Albuterol  neb every 6 hours.  Wheezing/shortness of breath   Chronic kidney disease, stage 3a  Baseline creatinine 1.5-1.6. -- Cr 1.40>1.23>1.45; stable -- Avoid nephrotoxins, renally dose all medications   BPH (benign prostatic hyperplasia) -- Finasteride  5 mg p.o. daily -- Tamsulosin  0.8 mg p.o. twice daily  Weakness/debility/deconditioning: -- PT/OT following; TOC consulted  DVT prophylaxis: enoxaparin  (LOVENOX ) injection 40 mg Start: 04/19/24 2200    Code Status: Limited: Do not attempt resuscitation (DNR) -DNR-LIMITED -Do Not Intubate/DNI  Family Communication: No family present bedside this morning  Disposition Plan:  Level of care: Telemetry Status is: Observation The patient remains OBS appropriate and will d/c before 2 midnights.    Consultants:  Palliative care  Procedures:  none  Antimicrobials:  none   Subjective: Patient seen examined bedside, lying in bed.  Complained of chest discomfort overnight.  Requesting change in his  hospice provider to [facility].  Continues to replete potassium, may need supplementation on discharge.  Discussed anticipated  discharge likely tomorrow if potassium improved. No other specific questions, concerns or complaints at this time.  Denies headache, no visual changes, no current chest pain, no shortness of breath, no abdominal pain, no fever/chills/night sweats, no nausea/vomitus or diarrhea.  No acute events overnight per nursing staff.  Objective: Vitals:   04/21/24 1600 04/21/24 2112 04/22/24 0444 04/22/24 1311  BP: 90/72 93/71 (!) 89/75 (!) 87/71  Pulse: 98 90 90 (!) 103  Resp:  18 18 20   Temp:  98.1 F (36.7 C) 97.8 F (36.6 C) 97.9 F (36.6 C)  TempSrc:  Oral Oral Axillary  SpO2:  99% 100% 100%  Weight:      Height:        Intake/Output Summary (Last 24 hours) at 04/22/2024 1337 Last data filed at 04/22/2024 1308 Gross per 24 hour  Intake 1005.16 ml  Output 100 ml  Net 905.16 ml   Filed Weights   04/19/24 1503 04/19/24 1813  Weight: 69.4 kg 69.2 kg    Examination:  Physical Exam: GEN: NAD, alert and oriented x 3, chronically ill appearance HEENT: NCAT, PERRL, EOMI, sclera clear, MMM PULM: CTAB w/o wheezes/crackles, normal respiratory effort, on room air CV: RRR w/o M/G/R GI: abd soft, NTND, NABS, no R/G/M MSK: no peripheral edema, muscle strength globally intact 5/5 bilateral upper/lower extremities NEURO: CN II-XII intact, no focal deficits, sensation to light touch intact PSYCH: normal mood/affect Integumentary: dry/intact, no rashes or wounds    Data Reviewed: I have personally reviewed following labs and imaging studies  CBC: Recent Labs  Lab 04/19/24 1513 04/20/24 0414  WBC 2.8* 2.7*  HGB 16.3 15.6  HCT 46.9 46.2  MCV 96.3 98.7  PLT 144* 123*   Basic Metabolic Panel: Recent Labs  Lab 04/19/24 1513 04/20/24 0414 04/21/24 0402 04/22/24 0449  NA 140 138 144 138  K 2.4* 3.2* 3.5 3.1*  CL 97* 102 104 104  CO2 32 29 30 25   GLUCOSE 128* 104* 94 96  BUN 19 17 13 15   CREATININE 1.55* 1.40* 1.23 1.45*  CALCIUM  8.0* 8.4* 8.9 8.4*  MG  --  2.1 1.8 1.7  PHOS  --    --  3.8  --    GFR: Estimated Creatinine Clearance: 51.1 mL/min (A) (by C-G formula based on SCr of 1.45 mg/dL (H)). Liver Function Tests: Recent Labs  Lab 04/21/24 0402  ALBUMIN  3.4*   No results for input(s): "LIPASE", "AMYLASE" in the last 168 hours. No results for input(s): "AMMONIA" in the last 168 hours. Coagulation Profile: No results for input(s): "INR", "PROTIME" in the last 168 hours. Cardiac Enzymes: No results for input(s): "CKTOTAL", "CKMB", "CKMBINDEX", "TROPONINI" in the last 168 hours. BNP (last 3 results) No results for input(s): "PROBNP" in the last 8760 hours. HbA1C: No results for input(s): "HGBA1C" in the last 72 hours. CBG: No results for input(s): "GLUCAP" in the last 168 hours. Lipid Profile: No results for input(s): "CHOL", "HDL", "LDLCALC", "TRIG", "CHOLHDL", "LDLDIRECT" in the last 72 hours. Thyroid  Function Tests: No results for input(s): "TSH", "T4TOTAL", "FREET4", "T3FREE", "THYROIDAB" in the last 72 hours. Anemia Panel: No results for input(s): "VITAMINB12", "FOLATE", "FERRITIN", "TIBC", "IRON", "RETICCTPCT" in the last 72 hours. Sepsis Labs: No results for input(s): "PROCALCITON", "LATICACIDVEN" in the last 168 hours.  Recent Results (from the past 240 hours)  C. trachomatis/N. gonorrhoeae RNA     Status: None   Collection  Time: 04/14/24  2:36 AM   Specimen: Urine  Result Value Ref Range Status   C. trachomatis RNA, TMA NOT DETECTED NOT DETECTED Final   N. gonorrhoeae RNA, TMA NOT DETECTED NOT DETECTED Final    Comment: The analytical performance characteristics of this assay, when used to test SurePath(TM) specimens have been determined by [employer]. The modifications have not been cleared or approved by the FDA. This assay has been validated pursuant to the CLIA regulations and is used for clinical purposes. . For additional information, please refer to https://education.questdiagnostics.com/faq/FAQ154 (This link is being  provided for information/ educational purposes only.) .          Radiology Studies: No results found.      Scheduled Meds:  amiodarone   200 mg Oral Daily   aspirin  EC  81 mg Oral q AM   bictegravir-emtricitabine -tenofovir  AF  1 tablet Oral Daily   enoxaparin  (LOVENOX ) injection  40 mg Subcutaneous Q24H   feeding supplement  237 mL Oral BID BM   finasteride   5 mg Oral Daily   melatonin  10 mg Oral QHS   mexiletine  250 mg Oral Q12H   midodrine   30 mg Oral TID WC   mometasone -formoterol   2 puff Inhalation BID   potassium chloride   30 mEq Oral Q3H   sodium chloride  flush  3 mL Intravenous Q12H   tamsulosin   0.8 mg Oral BID   torsemide   20 mg Oral Daily   valACYclovir   500 mg Oral Daily   Continuous Infusions:   LOS: 0 days    Time spent: 48 minutes spent on 04/22/2024 caring for this patient face-to-face including chart review, ordering labs/tests, documenting, discussion with nursing staff, consultants, updating family and interview/physical exam    Rema Care [place], DO [facility] Available via Epic secure chat 7am-7pm After these hours, please refer to coverage provider listed on [email or web address] 04/22/2024, 1:37 PM

## 2024-04-22 NOTE — NC FL2 (Signed)
 [place]  MEDICAID [id number] LEVEL OF CARE FORM     IDENTIFICATION  Patient Name: Bryam Bandt Birthdate: 11/11/1961 Sex: male Admission Date (Current Location): 04/19/2024  [facility] and [place] Number:  Producer, television/film/video and Address:  [facility],  [address] [place], [place] [address]      Provider Number: [id number]  Attending Physician Name and Address:  [place], Rema Care, DO  Relative Name and Phone Number:       Current Level of Care: Hospital Recommended Level of Care: Nursing Facility Prior Approval Number:    Date Approved/Denied:   PASRR Number: pending  Discharge Plan: Other (Comment) (LTC)    Current Diagnoses: Patient Active Problem List   Diagnosis Date Noted   Malnutrition of moderate degree 04/22/2024   Hypokalemia 04/19/2024   Hypotension 04/19/2024   Debility 04/19/2024   Musculoskeletal pain 03/22/2024   Acute on chronic heart failure (HCC) 03/18/2024   Chronic kidney disease, stage 3a (HCC) 02/22/2024   Acute on chronic HFrEF (heart failure with reduced ejection fraction) (HCC) 02/17/2024   Chest pain 02/17/2024   Ventricular tachycardia (HCC) 01/23/2024   Acute on chronic systolic CHF (congestive heart failure) (HCC) 01/03/2024   Asthmatic bronchitis 01/24/2023   Rash and nonspecific skin eruption 12/19/2022   Arm numbness 11/15/2022   Aortic atherosclerosis (HCC) 11/15/2022   Chronic kidney disease 08/08/2022   Goals of care, counseling/discussion 08/08/2022   Cigarette smoker 01/26/2022   ETD (Eustachian tube dysfunction), right 01/26/2022   Allergy to pollen 01/26/2022   Polycythemia 12/08/2021   Vitamin D deficiency 12/08/2021   Hip pain 01/06/2021   VT (ventricular tachycardia) (HCC) 08/25/2020   HIV positive (HCC) 07/12/2020   Paroxysmal atrial fibrillation (HCC) 06/10/2020   BPH (benign prostatic hyperplasia) 01/28/2020   HFrEF (heart failure with reduced ejection fraction) (HCC) 01/28/2020   Schizotypal disorder  (HCC) 08/29/2019   Achilles tendinitis, left leg 04/03/2019   AICD (automatic cardioverter/defibrillator) present 08/15/2018   Abnormal EKG 06/28/2018   Adjustment disorder with mixed disturbance of emotions and conduct    NICM (nonischemic cardiomyopathy) (HCC) 01/08/2018   Anxiety 12/20/2017   Bipolar 1 disorder (HCC) 10/10/2017   End-stage systolic heart failure, chronic (HCC) 05/13/2017   HSV-2 infection 07/17/2016   History of attempted suicide 04/14/2016   History of alcoholism (HCC) 04/14/2016   History of substance abuse (HCC) 04/14/2016   Constipation 01/17/2016   COPD (chronic obstructive pulmonary disease) (HCC) 10/04/2015   Abnormal thyroid  stimulating hormone (TSH) level 10/04/2015   MDD (major depressive disorder), recurrent episode, severe (HCC) 09/23/2015   Hypertension 09/10/2015   PTSD (post-traumatic stress disorder) 08/07/2015   Primary insomnia 04/02/2015   Herpesviral infection of penis 04/02/2015   Depression with suicidal ideation 07/03/2014   Generalized anxiety disorder 05/29/2014   Tobacco use disorder 02/17/2013   Heart failure (HCC) 09/19/2011   Other primary cardiomyopathies 08/24/2011   HIV (human immunodeficiency virus infection) (HCC) 02/01/2009   Recurrent HSV (herpes simplex virus) 02/01/2009   Hyperlipidemia 02/01/2009   Allergic rhinitis 02/01/2009    Orientation RESPIRATION BLADDER Height & Weight     Self, Time, Situation, Place  O2 (2L) Continent Weight: 152 lb 9.6 oz (69.2 kg) Height:  5\' 8"  (172.7 cm)  BEHAVIORAL SYMPTOMS/MOOD NEUROLOGICAL BOWEL NUTRITION STATUS      Continent Diet (Heart Healthy)  AMBULATORY STATUS COMMUNICATION OF NEEDS Skin   Independent Verbally Normal  Personal Care Assistance Level of Assistance  Bathing, Feeding, Dressing Bathing Assistance: Limited assistance Feeding assistance: Independent Dressing Assistance: Limited assistance     Functional Limitations Info  Sight,  Hearing, Speech Sight Info: Adequate Hearing Info: Adequate      SPECIAL CARE FACTORS FREQUENCY                       Contractures Contractures Info: Not present    Additional Factors Info  Code Status, Allergies Code Status Info: DNR Allergies Info: NKA           Current Medications (04/22/2024):  This is the current hospital active medication list Current Facility-Administered Medications  Medication Dose Route Frequency Provider Last Rate Last Admin   acetaminophen  (TYLENOL ) tablet 650 mg  650 mg Oral Q6H PRN Smith, Rondell A, MD   650 mg at 04/21/24 1447   Or   acetaminophen  (TYLENOL ) suppository 650 mg  650 mg Rectal Q6H PRN Smith, Rondell A, MD       albuterol  (PROVENTIL ) (2.5 MG/3ML) 0.083% nebulizer solution 2.5 mg  2.5 mg Nebulization Q6H PRN Smith, Rondell A, MD       amiodarone  (PACERONE ) tablet 200 mg  200 mg Oral Daily Felipe Horton, Rondell A, MD   200 mg at 04/22/24 0946   aspirin  EC tablet 81 mg  81 mg Oral q AM Smith, Rondell A, MD   81 mg at 04/22/24 1610   bictegravir-emtricitabine -tenofovir  AF (BIKTARVY ) 50-200-25 MG per tablet 1 tablet  1 tablet Oral Daily Manny Sees A, MD   1 tablet at 04/22/24 0946   docusate sodium  (COLACE) capsule 100 mg  100 mg Oral BID PRN Shalhoub, George J, MD   100 mg at 04/20/24 1233   enoxaparin  (LOVENOX ) injection 40 mg  40 mg Subcutaneous Q24H Smith, Rondell A, MD   40 mg at 04/21/24 2141   feeding supplement (ENSURE ENLIVE / ENSURE PLUS) liquid 237 mL  237 mL Oral BID BM Smith, Rondell A, MD   237 mL at 04/21/24 1444   finasteride  (PROSCAR ) tablet 5 mg  5 mg Oral Daily Smith, Rondell A, MD   5 mg at 04/22/24 0946   LORazepam  (ATIVAN ) tablet 0.5 mg  0.5 mg Oral Q4H PRN [place], Eric J, DO   0.5 mg at 04/22/24 1631   melatonin tablet 10 mg  10 mg Oral QHS Smith, Rondell A, MD   10 mg at 04/21/24 2142   mexiletine (MEXITIL ) capsule 250 mg  250 mg Oral Q12H Naasz, Hayley N, MD   250 mg at 04/22/24 0946   midodrine  (PROAMATINE )  tablet 30 mg  30 mg Oral TID WC Naasz, Hayley N, MD   30 mg at 04/22/24 1218   mometasone -formoterol  (DULERA ) 100-5 MCG/ACT inhaler 2 puff  2 puff Inhalation BID Manny Sees A, MD   2 puff at 04/22/24 0900   morphine  10 MG/5ML solution 2.5 mg  2.5 mg Oral Q2H PRN Shalhoub, George J, MD   2.5 mg at 04/22/24 1219   ondansetron  (ZOFRAN ) injection 4 mg  4 mg Intravenous Q6H PRN Chavez, Abigail, NP   4 mg at 04/19/24 2325   potassium chloride  (KLOR-CON  M) CR tablet 30 mEq  30 mEq Oral Q3H [place], Eric J, DO   30 mEq at 04/22/24 1219   sodium chloride  flush (NS) 0.9 % injection 3 mL  3 mL Intravenous Q12H Smith, Rondell A, MD   3 mL at 04/22/24 0946   tamsulosin  (FLOMAX ) capsule 0.8  mg  0.8 mg Oral BID Shalhoub, George J, MD   0.8 mg at 04/22/24 0945   torsemide  (DEMADEX ) tablet 20 mg  20 mg Oral Daily [place], Eric J, DO   20 mg at 04/22/24 1610   traZODone  (DESYREL ) tablet 150 mg  150 mg Oral QHS PRN Shalhoub, George J, MD       trimethobenzamide  (TIGAN ) injection 200 mg  200 mg Intramuscular Q6H PRN Lena Qualia, MD       valACYclovir  (VALTREX ) tablet 500 mg  500 mg Oral Daily Smith, Rondell A, MD   500 mg at 04/22/24 9604     Discharge Medications: Please see discharge summary for a list of discharge medications.  Relevant Imaging Results:  Relevant Lab Results:   Additional Information SSN:[id number]  Amel Kitch M Jannie Doyle, LCSW

## 2024-04-22 NOTE — Progress Notes (Signed)
   04/22/24 1645  Spiritual Encounters  Type of Visit Attempt (pt unavailable)  Reason for visit Routine spiritual support  OnCall Visit No   Attempted to visit patient however patient was being attended by interdisciplinary team members. Will return.

## 2024-04-22 NOTE — TOC Progression Note (Signed)
 Transition of Care [facility]) - Progression Note    Patient Details  Name: Ryan Long MRN: [id number] Date of Birth: Feb 03, 1961  [facility] CM/SW Contact  Gertha Ku, LCSW Phone Number: 04/22/2024, 3:42 PM  Clinical Narrative:     CSW spoke with [facility] hosp[facility], they came to see pt to discuss d/c from their services.    CSW received a call from Roxie at [employer]. He stated that the pt reported being unable to pay out of pocket for private duty care. Leeland Pugh mentioned that he discussed long-term care (LTC) placement with the pt and the pt has agreed to this plan. Mathew requested that CSW fax the pt's FL2 to several LTC facilities that may have an open bed. He acknowledged that the pt may not be placed directly from the hospital but that the process can be started.  CSW spoke with the pt to confirm, and the pt has agreed to have his information sent out to begin the LTC placement process. CSW informed the pt that he will most likely be d/c home with hospice while being screened for LTC placement. TOC to follow.     Expected Discharge Plan: Home w Hospice Care Barriers to Discharge: Continued Medical Work up  Expected Discharge Plan and Services       Living arrangements for the past 2 months: Single Family Home                                       Social Determinants of Health (SDOH) Interventions SDOH Screenings   Food Insecurity: No Food Insecurity (04/19/2024)  Housing: Low Risk  (04/19/2024)  Transportation Needs: No Transportation Needs (04/19/2024)  Utilities: Not At Risk (04/19/2024)  Alcohol  Screen: Low Risk  (07/17/2023)  Depression (PHQ2-9): Low Risk  (08/01/2023)  Financial Resource Strain: Low Risk  (07/17/2023)  Physical Activity: Insufficiently Active (07/17/2023)  Social Connections: Socially Isolated (04/19/2024)  Stress: Stress Concern Present (07/17/2023)  Tobacco Use: High Risk (04/19/2024)  Health Literacy:  Adequate Health Literacy (07/18/2023)    Readmission Risk Interventions    03/19/2024    3:54 PM  Readmission Risk Prevention Plan  Transportation Screening Complete  PCP or Specialist Appt within 3-5 Days Complete  HRI or Home Care Consult Complete  Medication Review (RN Care Manager) Complete

## 2024-04-23 DIAGNOSIS — I428 Other cardiomyopathies: Secondary | ICD-10-CM | POA: Diagnosis present

## 2024-04-23 DIAGNOSIS — I480 Paroxysmal atrial fibrillation: Secondary | ICD-10-CM | POA: Diagnosis present

## 2024-04-23 DIAGNOSIS — F419 Anxiety disorder, unspecified: Secondary | ICD-10-CM | POA: Diagnosis present

## 2024-04-23 DIAGNOSIS — E876 Hypokalemia: Secondary | ICD-10-CM | POA: Diagnosis present

## 2024-04-23 DIAGNOSIS — I5022 Chronic systolic (congestive) heart failure: Secondary | ICD-10-CM

## 2024-04-23 DIAGNOSIS — I959 Hypotension, unspecified: Secondary | ICD-10-CM

## 2024-04-23 DIAGNOSIS — R079 Chest pain, unspecified: Secondary | ICD-10-CM | POA: Diagnosis not present

## 2024-04-23 DIAGNOSIS — Z515 Encounter for palliative care: Secondary | ICD-10-CM | POA: Diagnosis not present

## 2024-04-23 DIAGNOSIS — I130 Hypertensive heart and chronic kidney disease with heart failure and stage 1 through stage 4 chronic kidney disease, or unspecified chronic kidney disease: Secondary | ICD-10-CM | POA: Diagnosis present

## 2024-04-23 DIAGNOSIS — I509 Heart failure, unspecified: Secondary | ICD-10-CM

## 2024-04-23 DIAGNOSIS — Z95810 Presence of automatic (implantable) cardiac defibrillator: Secondary | ICD-10-CM | POA: Diagnosis not present

## 2024-04-23 DIAGNOSIS — B20 Human immunodeficiency virus [HIV] disease: Secondary | ICD-10-CM | POA: Diagnosis present

## 2024-04-23 DIAGNOSIS — R0789 Other chest pain: Secondary | ICD-10-CM | POA: Diagnosis present

## 2024-04-23 DIAGNOSIS — Z79899 Other long term (current) drug therapy: Secondary | ICD-10-CM | POA: Diagnosis not present

## 2024-04-23 DIAGNOSIS — Z7951 Long term (current) use of inhaled steroids: Secondary | ICD-10-CM | POA: Diagnosis not present

## 2024-04-23 DIAGNOSIS — I5043 Acute on chronic combined systolic (congestive) and diastolic (congestive) heart failure: Secondary | ICD-10-CM | POA: Diagnosis present

## 2024-04-23 DIAGNOSIS — E785 Hyperlipidemia, unspecified: Secondary | ICD-10-CM | POA: Diagnosis present

## 2024-04-23 DIAGNOSIS — N400 Enlarged prostate without lower urinary tract symptoms: Secondary | ICD-10-CM | POA: Diagnosis present

## 2024-04-23 DIAGNOSIS — F319 Bipolar disorder, unspecified: Secondary | ICD-10-CM | POA: Diagnosis present

## 2024-04-23 DIAGNOSIS — R5381 Other malaise: Secondary | ICD-10-CM | POA: Diagnosis present

## 2024-04-23 DIAGNOSIS — I5084 End stage heart failure: Secondary | ICD-10-CM | POA: Diagnosis present

## 2024-04-23 DIAGNOSIS — J4489 Other specified chronic obstructive pulmonary disease: Secondary | ICD-10-CM | POA: Diagnosis present

## 2024-04-23 DIAGNOSIS — Z66 Do not resuscitate: Secondary | ICD-10-CM | POA: Diagnosis present

## 2024-04-23 DIAGNOSIS — F17210 Nicotine dependence, cigarettes, uncomplicated: Secondary | ICD-10-CM | POA: Diagnosis present

## 2024-04-23 DIAGNOSIS — E440 Moderate protein-calorie malnutrition: Secondary | ICD-10-CM | POA: Diagnosis present

## 2024-04-23 DIAGNOSIS — J449 Chronic obstructive pulmonary disease, unspecified: Secondary | ICD-10-CM | POA: Diagnosis not present

## 2024-04-23 DIAGNOSIS — I472 Ventricular tachycardia, unspecified: Secondary | ICD-10-CM | POA: Diagnosis present

## 2024-04-23 DIAGNOSIS — Z21 Asymptomatic human immunodeficiency virus [HIV] infection status: Secondary | ICD-10-CM | POA: Diagnosis not present

## 2024-04-23 DIAGNOSIS — N1831 Chronic kidney disease, stage 3a: Secondary | ICD-10-CM | POA: Diagnosis present

## 2024-04-23 DIAGNOSIS — R3911 Hesitancy of micturition: Secondary | ICD-10-CM | POA: Diagnosis not present

## 2024-04-23 DIAGNOSIS — Z8249 Family history of ischemic heart disease and other diseases of the circulatory system: Secondary | ICD-10-CM | POA: Diagnosis not present

## 2024-04-23 LAB — BASIC METABOLIC PANEL WITH GFR
Anion gap: 9 (ref 5–15)
BUN: 16 mg/dL (ref 8–23)
CO2: 24 mmol/L (ref 22–32)
Calcium: 8.4 mg/dL — ABNORMAL LOW (ref 8.9–10.3)
Chloride: 102 mmol/L (ref 98–111)
Creatinine, Ser: 1.26 mg/dL — ABNORMAL HIGH (ref 0.61–1.24)
GFR, Estimated: >60 mL/min (ref >60)
Glucose, Bld: 100 mg/dL — ABNORMAL HIGH (ref 70–99)
Potassium: 3.7 mmol/L (ref 3.5–5.1)
Sodium: 135 mmol/L (ref 135–145)

## 2024-04-23 LAB — MAGNESIUM: Magnesium: 2.1 mg/dL (ref 1.7–2.4)

## 2024-04-23 MED ORDER — ORAL CARE MOUTH RINSE: 15.0000 mL | OROMUCOSAL | Status: DC | PRN

## 2024-04-23 MED ORDER — MAGNESIUM SULFATE IN D5W 1-5 GM/100ML-% IV SOLN
1.0000 g | Freq: Once | INTRAVENOUS | Status: AC
  Administered 2024-04-23: 1 g via INTRAVENOUS
  Filled 2024-04-23: qty 100

## 2024-04-23 NOTE — Progress Notes (Addendum)
 [facility] Liaison Note  Received request from [facility] for hospice services at home after discharge. Spoke with patient to initiate education related to hospice philosophy, services and team approach to care. Patient verbalized understanding of information given. Per discussion, the plan is undecided at this time.  DME needs discussed. Patient has the following equipment in the home: O2 concentrator, rollator. Family requests the following equipment for delivery: shower chair  Please send signed and completed DNR home with patient/family. Please provide prescriptions at discharge as needed to ensure ongoing symptom management.  [facility] information and contact numbers given to patient. Please call with any concerns.  Thank you for the opportunity to participate in this patient's care.   Ardine Beckwith, LPN [facility] Liaison [phone]

## 2024-04-23 NOTE — Progress Notes (Signed)
   04/23/24 1455  Spiritual Encounters  Type of Visit Follow up  Care provided to: Patient  Referral source Patient request  Reason for visit Urgent spiritual support  OnCall Visit No   Follow up with patient who is in spiritual distress. He says the doctor told him that he has about 6 months and there's nothing else they can do. He says he is not eligible for a pump. He say he will try to get a second opinion. Patient has no family other than a brother who he is estranged from, who is not local. Live review and compassionate presence. Provided spiritual care until medicine began to kick in and patient was getting sleepy. Patient asked for a return visit tomorrow.

## 2024-04-23 NOTE — Progress Notes (Signed)
   04/23/24 1610  Provider Notification  Provider Name/Title Dr. Alfonse Angle  Date Provider Notified 04/23/24  Time Provider Notified [phone]  Method of Notification Page (secure chat)  Notification Reason Other (Comment) (8 beats [facility], asymptomatic)  Provider response No new orders  Date of Provider Response 04/23/24  Time of Provider Response [phone]

## 2024-04-23 NOTE — Progress Notes (Signed)
 [facility]  PROGRESS NOTE  Kaynen Hruza ZOX:[id number] DOB: Mar 02, 1961 DOA: 04/19/2024 PCP: Tretha Fu, MD   Brief HPI:    63 y.o. male with past medical history significant for chronic combined/diastolic CHF/end-stage cardiomyopathy status post AICD (LVEF 20-25% with G1DD ), NSVT, HIV (CD4 count 429, undetectable VL 04/14/2024) , hypertension, hyperlipidemia, COPD, anxiety, depression, bipolar disorder, substance abuse, BPH, CKD stage IIIa (Cr 1.5 - 1.6) currently on home hospice who presented to [facility] ED on 04/19/2024 with complaint of chest pain, dizziness, headache, shortness of breath.    Assessment/Plan:   End-stage systolic heart failure, chronic  Patient presented with symptoms of intermittent lightheadedness chest discomfort and shortness of breath consistent with end-stage congestive heart failure. Patient is already on hospice for his advanced disease with a [facility]; prognosis is poor and ICD was shut off earlier this year.  Patient euvolemic on physical exam with total loss of 45 pounds over the last year. -- Palliative care following, appreciate assistance -- Unlikely candidate for residential hospice at this point but would benefit from discharge with hospice -Patient continues to have episodes of nonsustained V. tach, currently requiring oxygen  2 L/min, has been having episodes of hypotension, tachycardia -- Midodrine  30 mg p.o. 3 times daily -- Morphine  2.5 mg p.o. every 2 hours as needed severe pain   Chest pain Chest pain thought to be secondary to progressive symptoms of end-stage heart failure.  Troponins within normal limits. --Supportive care   Hypokalemia Replete   HIV (human immunodeficiency virus infection)  -- Continue Biktarvy  -- Continue Valtrex  due to history of recurrent HSV -- Outpatient ID follow-up with infectious disease as needed   Paroxysmal atrial fibrillation  -- Continue mexiletine 250 mg p.o. every 12 hours  and amiodarone  200 mg p.o. daily -- not on anticoagulation   Hx Ventricular tachycardia  History of admission for VT storm earlier in the year; ICD now turned off. -Patient having episodes of nonsustained V. tach -- Continue mexiletine 250 mg p.o. every 12 hours and amiodarone  200 mg p.o. daily -Serum magnesium  1.7, will give 1 g of IV magnesium  sulfate   COPD (chronic obstructive pulmonary disease); not in acute exacerbation -- Dulera  2 puffs twice daily -- Albuterol  neb every 6 hours.  Wheezing/shortness of breath   Chronic kidney disease, stage 3a  Baseline creatinine 1.5-1.6. -- Cr 1.40>1.23>1.45; stable -- Avoid nephrotoxins, renally dose all medications   BPH (benign prostatic hyperplasia) -- Finasteride  5 mg p.o. daily -- Tamsulosin  0.8 mg p.o. twice daily   Weakness/debility/deconditioning: -- PT/OT following; TOC consulted      Medications     amiodarone   200 mg Oral Daily   aspirin  EC  81 mg Oral q AM   bictegravir-emtricitabine -tenofovir  AF  1 tablet Oral Daily   enoxaparin  (LOVENOX ) injection  40 mg Subcutaneous Q24H   feeding supplement  237 mL Oral BID BM   finasteride   5 mg Oral Daily   melatonin  10 mg Oral QHS   mexiletine  250 mg Oral Q12H   midodrine   30 mg Oral TID WC   mometasone -formoterol   2 puff Inhalation BID   sodium chloride  flush  3 mL Intravenous Q12H   tamsulosin   0.8 mg Oral BID   torsemide   20 mg Oral Daily   valACYclovir   500 mg Oral Daily     Data Reviewed:   CBG:  No results for input(s): "GLUCAP" in the last 168 hours.  SpO2: 100 % O2 Flow Rate (L/min): 2 L/min  Vitals:   04/22/24 2215 04/23/24 0300 04/23/24 0618 04/23/24 0844  BP: 97/70 (P) 99/76 104/78   Pulse: 94 (!) (P) 108 (!) 116   Resp: 18  20   Temp: 97.7 F (36.5 C) (P) 98.2 F (36.8 C) 98.4 F (36.9 C)   TempSrc: Oral (P) Oral Oral   SpO2: 100% (P) 98% 100% 100%  Weight:      Height:          Data Reviewed:  Basic Metabolic Panel: Recent Labs   Lab 04/19/24 1513 04/20/24 0414 04/21/24 0402 04/22/24 0449 04/23/24 0420  NA 140 138 144 138 135  K 2.4* 3.2* 3.5 3.1* 3.7  CL 97* 102 104 104 102  CO2 32 29 30 25 24   GLUCOSE 128* 104* 94 96 100*  BUN 19 17 13 15 16   CREATININE 1.55* 1.40* 1.23 1.45* 1.26*  CALCIUM  8.0* 8.4* 8.9 8.4* 8.4*  MG  --  2.1 1.8 1.7 2.1  PHOS  --   --  3.8  --   --     CBC: Recent Labs  Lab 04/19/24 1513 04/20/24 0414  WBC 2.8* 2.7*  HGB 16.3 15.6  HCT 46.9 46.2  MCV 96.3 98.7  PLT 144* 123*    LFT Recent Labs  Lab 04/21/24 0402  ALBUMIN  3.4*     Antibiotics: Anti-infectives (From admission, onward)    Start     Dose/Rate Route Frequency Ordered Stop   04/20/24 1000  bictegravir-emtricitabine -tenofovir  AF (BIKTARVY ) 50-200-25 MG per tablet 1 tablet        1 tablet Oral Daily 04/19/24 2243     04/20/24 1000  valACYclovir  (VALTREX ) tablet 500 mg        500 mg Oral Daily 04/19/24 2243          DVT prophylaxis: Lovenox   Code Status: DNR  Family Communication: No family at bedside   CONSULTS    Subjective   Denies chest pain.  Has been having episodes of nonsustained V. tach   Objective    Physical Examination:   General-appears in no acute distress Heart-S1-S2, regular, no murmur auscultated Lungs-clear to auscultation bilaterally, no wheezing or crackles auscultated Abdomen-soft, nontender, no organomegaly Extremities-no edema in the lower extremities Neuro-alert, oriented x3, no focal deficit noted  Status is: Inpatient:             [place] Esther S Janayla Marik   [facility] If 7PM-7AM, please contact night-coverage at [email or web address], Office  [phone]   04/23/2024, 8:49 AM  LOS: 0 days

## 2024-04-24 DIAGNOSIS — E876 Hypokalemia: Secondary | ICD-10-CM | POA: Diagnosis not present

## 2024-04-24 DIAGNOSIS — R079 Chest pain, unspecified: Secondary | ICD-10-CM | POA: Diagnosis not present

## 2024-04-24 DIAGNOSIS — I5022 Chronic systolic (congestive) heart failure: Secondary | ICD-10-CM | POA: Diagnosis not present

## 2024-04-24 DIAGNOSIS — I959 Hypotension, unspecified: Secondary | ICD-10-CM | POA: Diagnosis not present

## 2024-04-24 MED ORDER — TAMSULOSIN HCL 0.4 MG PO CAPS
0.4000 mg | ORAL_CAPSULE | Freq: Two times a day (BID) | ORAL | Status: DC
  Administered 2024-04-24 – 2024-04-25 (×2): 0.4 mg via ORAL
  Filled 2024-04-24 (×2): qty 1

## 2024-04-24 MED ORDER — SENNA 8.6 MG PO TABS
2.0000 | ORAL_TABLET | Freq: Every day | ORAL | Status: DC
  Administered 2024-04-24 – 2024-05-09 (×12): 17.2 mg via ORAL
  Filled 2024-04-24 (×15): qty 2

## 2024-04-24 MED ORDER — ALBUTEROL SULFATE (2.5 MG/3ML) 0.083% IN NEBU
2.5000 mg | INHALATION_SOLUTION | Freq: Two times a day (BID) | RESPIRATORY_TRACT | Status: DC
  Administered 2024-04-25 – 2024-05-04 (×19): 2.5 mg via RESPIRATORY_TRACT
  Filled 2024-04-24 (×23): qty 3

## 2024-04-24 NOTE — Plan of Care (Signed)

## 2024-04-24 NOTE — Progress Notes (Signed)
   04/24/24 1113  Spiritual Encounters  Type of Visit Follow up  Care provided to: Patient  Reason for visit Routine spiritual support  OnCall Visit No   Follow up visit with patient. Patient in better spirits today indicating that he may be discharged tomorrow to a SNF. Discussed possible plans, hopes and acceptance. Patient while cheerful today, not talkative. Provided spiritual care, ministry of presence and prayer.

## 2024-04-24 NOTE — TOC Progression Note (Signed)
 Transition of Care [facility]) - Progression Note    Patient Details  Name: Ryan Long MRN: [id number] Date of Birth: 25-Mar-1961  [facility] CM/SW Contact  Gertha Ku, LCSW Phone Number: 04/24/2024, 8:59 AM  Clinical Narrative:     CSW spoke with Tammy with [facility] , she reported she will come see pt today to review for possible placement.   Pt PASRR was assigned [id number] F. TOC to follow.   Expected Discharge Plan: Home w Hospice Care Barriers to Discharge: Continued Medical Work up  Expected Discharge Plan and Services       Living arrangements for the past 2 months: Single Family Home                                       Social Determinants of Health (SDOH) Interventions SDOH Screenings   Food Insecurity: No Food Insecurity (04/19/2024)  Housing: Low Risk  (04/19/2024)  Transportation Needs: No Transportation Needs (04/19/2024)  Utilities: Not At Risk (04/19/2024)  Alcohol  Screen: Low Risk  (07/17/2023)  Depression (PHQ2-9): Low Risk  (08/01/2023)  Financial Resource Strain: Low Risk  (07/17/2023)  Physical Activity: Insufficiently Active (07/17/2023)  Social Connections: Socially Isolated (04/19/2024)  Stress: Stress Concern Present (07/17/2023)  Tobacco Use: High Risk (04/19/2024)  Health Literacy: Adequate Health Literacy (07/18/2023)    Readmission Risk Interventions    03/19/2024    3:54 PM  Readmission Risk Prevention Plan  Transportation Screening Complete  PCP or Specialist Appt within 3-5 Days Complete  HRI or Home Care Consult Complete  Medication Review (RN Care Manager) Complete

## 2024-04-24 NOTE — Progress Notes (Signed)
 [facility]  PROGRESS NOTE  Ryan Long ZOX:[id number] DOB: [birth year] DOA: 04/19/2024 PCP: Ryan Fu, MD   Brief HPI:    63 y.o. male with past medical history significant for chronic combined/diastolic CHF/end-stage cardiomyopathy status post AICD (LVEF 20-25% with G1DD ), NSVT, HIV (CD4 count 429, undetectable VL 04/14/2024) , hypertension, hyperlipidemia, COPD, anxiety, depression, bipolar disorder, substance abuse, BPH, CKD stage IIIa (Cr 1.5 - 1.6) currently on home hospice who presented to [facility] ED on 04/19/2024 with complaint of chest pain, dizziness, headache, shortness of breath.    Assessment/Plan:   End-stage systolic heart failure, chronic  Patient presented with symptoms of intermittent lightheadedness chest discomfort and shortness of breath consistent with end-stage congestive heart failure. Patient is already on hospice for his advanced disease with a [facility]; prognosis is poor and ICD was shut off earlier this year.  Patient euvolemic on physical exam with total loss of 45 pounds over the last year. -- Palliative care following, appreciate assistance -- Unlikely candidate for residential hospice at this point but would benefit from discharge with hospice -Patient continues to have episodes of nonsustained V. tach, currently requiring oxygen  2 L/min, has been having episodes of hypotension, tachycardia -- Midodrine  30 mg p.o. 3 times daily -- Morphine  2.5 mg p.o. every 2 hours as needed severe pain -Continue torsemide  20 mg daily   Chest pain Chest pain thought to be secondary to progressive symptoms of end-stage heart failure.  Troponins within normal limits. --Supportive care   Hypokalemia Replete   HIV (human immunodeficiency virus infection)  -- Continue Biktarvy  -- Continue Valtrex  due to history of recurrent HSV -- Outpatient ID follow-up with infectious disease as needed   Paroxysmal atrial fibrillation  -- Continue  mexiletine 250 mg p.o. every 12 hours and amiodarone  200 mg p.o. daily -- not on anticoagulation   Hx Ventricular tachycardia  History of admission for VT storm earlier in the year; ICD now turned off. -Patient having episodes of nonsustained V. tach -- Continue mexiletine 250 mg p.o. every 12 hours and amiodarone  200 mg p.o. daily -Serum magnesium  1.7, he was given 1 g of IV magnesium  sulfate   COPD (chronic obstructive pulmonary disease); not in acute exacerbation -- Dulera  2 puffs twice daily -- Albuterol  neb every 6 hours.  Wheezing/shortness of breath   Chronic kidney disease, stage 3a  Baseline creatinine 1.5-1.6. -- Cr 1.40>1.23>1.45; stable -- Avoid nephrotoxins, renally dose all medications   BPH (benign prostatic hyperplasia) -- Finasteride  5 mg p.o. daily -Patient takes tamsulosin  0.8 mg p.o. twice daily.   -Will change it to 0.4 mg p.o. twice daily.    Weakness/debility/deconditioning: -- PT/OT following; TOC consulted      Medications     amiodarone   200 mg Oral Daily   aspirin  EC  81 mg Oral q AM   bictegravir-emtricitabine -tenofovir  AF  1 tablet Oral Daily   enoxaparin  (LOVENOX ) injection  40 mg Subcutaneous Q24H   feeding supplement  237 mL Oral BID BM   finasteride   5 mg Oral Daily   melatonin  10 mg Oral QHS   mexiletine  250 mg Oral Q12H   midodrine   30 mg Oral TID WC   mometasone -formoterol   2 puff Inhalation BID   sodium chloride  flush  3 mL Intravenous Q12H   tamsulosin   0.8 mg Oral BID   torsemide   20 mg Oral Daily   valACYclovir   500 mg Oral Daily     Data Reviewed:   CBG:  No  results for input(s): "GLUCAP" in the last 168 hours.  SpO2: 97 % O2 Flow Rate (L/min): 2 L/min    Vitals:   04/23/24 2028 04/24/24 0026 04/24/24 0108 04/24/24 0621  BP: 93/73 (!) 89/68  103/75  Pulse: (!) 101 95  (!) 103  Resp: 16 16  20   Temp: 97.6 F (36.4 C) 97.8 F (36.6 C)  97.8 F (36.6 C)  TempSrc: Oral Oral  Oral  SpO2: 99% 99% 99% 97%   Weight:      Height:          Data Reviewed:  Basic Metabolic Panel: Recent Labs  Lab 04/19/24 1513 04/20/24 0414 04/21/24 0402 04/22/24 0449 04/23/24 0420  NA 140 138 144 138 135  K 2.4* 3.2* 3.5 3.1* 3.7  CL 97* 102 104 104 102  CO2 32 29 30 25 24   GLUCOSE 128* 104* 94 96 100*  BUN 19 17 13 15 16   CREATININE 1.55* 1.40* 1.23 1.45* 1.26*  CALCIUM  8.0* 8.4* 8.9 8.4* 8.4*  MG  --  2.1 1.8 1.7 2.1  PHOS  --   --  3.8  --   --     CBC: Recent Labs  Lab 04/19/24 1513 04/20/24 0414  WBC 2.8* 2.7*  HGB 16.3 15.6  HCT 46.9 46.2  MCV 96.3 98.7  PLT 144* 123*    LFT Recent Labs  Lab 04/21/24 0402  ALBUMIN  3.4*     Antibiotics: Anti-infectives (From admission, onward)    Start     Dose/Rate Route Frequency Ordered Stop   04/20/24 1000  bictegravir-emtricitabine -tenofovir  AF (BIKTARVY ) 50-200-25 MG per tablet 1 tablet        1 tablet Oral Daily 04/19/24 2243     04/20/24 1000  valACYclovir  (VALTREX ) tablet 500 mg        500 mg Oral Daily 04/19/24 2243          DVT prophylaxis: Lovenox   Code Status: DNR  Family Communication: No family at bedside   CONSULTS    Subjective    Complains of swelling in lower extremities.  Denies shortness of breath.  Not requiring oxygen   Objective    Physical Examination:  Appears in no acute distress S1-S2, regular Lungs clear to auscultation bilaterally Extremities-trace edema in the lower extremities  Status is: Inpatient:             Ryan Long S Ryan Long   [facility] If 7PM-7AM, please contact night-coverage at [email or web address], Office  [phone]   04/24/2024, 7:59 AM  LOS: 1 day

## 2024-04-25 DIAGNOSIS — I959 Hypotension, unspecified: Secondary | ICD-10-CM | POA: Diagnosis not present

## 2024-04-25 DIAGNOSIS — R079 Chest pain, unspecified: Secondary | ICD-10-CM | POA: Diagnosis not present

## 2024-04-25 DIAGNOSIS — E876 Hypokalemia: Secondary | ICD-10-CM | POA: Diagnosis not present

## 2024-04-25 DIAGNOSIS — I5022 Chronic systolic (congestive) heart failure: Secondary | ICD-10-CM | POA: Diagnosis not present

## 2024-04-25 LAB — BASIC METABOLIC PANEL WITH GFR
Anion gap: 10 (ref 5–15)
BUN: 14 mg/dL (ref 8–23)
CO2: 26 mmol/L (ref 22–32)
Calcium: 8.6 mg/dL — ABNORMAL LOW (ref 8.9–10.3)
Chloride: 101 mmol/L (ref 98–111)
Creatinine, Ser: 1.21 mg/dL (ref 0.61–1.24)
GFR, Estimated: >60 mL/min (ref >60)
Glucose, Bld: 106 mg/dL — ABNORMAL HIGH (ref 70–99)
Potassium: 3.7 mmol/L (ref 3.5–5.1)
Sodium: 137 mmol/L (ref 135–145)

## 2024-04-25 MED ORDER — TAMSULOSIN HCL 0.4 MG PO CAPS
0.8000 mg | ORAL_CAPSULE | Freq: Every day | ORAL | Status: DC
Start: 2024-04-26 — End: 2024-05-09
  Administered 2024-04-26 – 2024-05-09 (×14): 0.8 mg via ORAL
  Filled 2024-04-25 (×14): qty 2

## 2024-04-25 MED ORDER — TAMSULOSIN HCL 0.4 MG PO CAPS
0.4000 mg | ORAL_CAPSULE | Freq: Once | ORAL | Status: AC
  Administered 2024-04-25: 0.4 mg via ORAL
  Filled 2024-04-25: qty 1

## 2024-04-25 NOTE — Plan of Care (Signed)
  Problem: Education: Goal: Knowledge of General Education information will improve Description: Including pain rating scale, medication(s)/side effects and non-pharmacologic comfort measures Outcome: Progressing   Problem: Activity: Goal: Risk for activity intolerance will decrease 04/25/2024 1414 by Hilda Lovings, RN Outcome: Progressing 04/25/2024 1414 by Hilda Lovings, RN Outcome: Progressing   Problem: Nutrition: Goal: Adequate nutrition will be maintained 04/25/2024 1414 by Hilda Lovings, RN Outcome: Progressing 04/25/2024 1414 by Hilda Lovings, RN Outcome: Progressing   Problem: Pain Managment: Goal: General experience of comfort will improve and/or be controlled 04/25/2024 1414 by Hilda Lovings, RN Outcome: Progressing 04/25/2024 1414 by Hilda Lovings, RN Outcome: Progressing   Problem: Safety: Goal: Ability to remain free from injury will improve Outcome: Progressing

## 2024-04-25 NOTE — TOC Progression Note (Addendum)
 Transition of Care [facility]) - Progression Note    Patient Details  Name: Ryan Long MRN: [id number] Date of Birth: 09/05/1961  Transition of Care [facility]) CM/SW Contact  Gertha Ku, LCSW Phone Number: 04/25/2024, 11:04 AM  Clinical Narrative:     CSW spoke with Tammy with [facility] they can not offer pt a bed at this time. CSW spoke with Bertell Broach with [facility] she will come to hospital to review pt for possible placement. TOC to follow.    Adden  2:00pm  Pt was accepted to [facility] for LTC with hospice. [facility] reports pt can admit Monday. Pt will need to bring HIV meds with him. TOC to follow.    Expected Discharge Plan: Home w Hospice Care Barriers to Discharge: Continued Medical Work up  Expected Discharge Plan and Services       Living arrangements for the past 2 months: Single Family Home                                       Social Determinants of Health (SDOH) Interventions SDOH Screenings   Food Insecurity: No Food Insecurity (04/19/2024)  Housing: Low Risk  (04/19/2024)  Transportation Needs: No Transportation Needs (04/19/2024)  Utilities: Not At Risk (04/19/2024)  Alcohol  Screen: Low Risk  (07/17/2023)  Depression (PHQ2-9): Low Risk  (08/01/2023)  Financial Resource Strain: Low Risk  (07/17/2023)  Physical Activity: Insufficiently Active (07/17/2023)  Social Connections: Socially Isolated (04/19/2024)  Stress: Stress Concern Present (07/17/2023)  Tobacco Use: High Risk (04/19/2024)  Health Literacy: Adequate Health Literacy (07/18/2023)    Readmission Risk Interventions    03/19/2024    3:54 PM  Readmission Risk Prevention Plan  Transportation Screening Complete  PCP or Specialist Appt within 3-5 Days Complete  HRI or Home Care Consult Complete  Medication Review (RN Care Manager) Complete

## 2024-04-25 NOTE — Progress Notes (Signed)
 [facility]  PROGRESS NOTE  Ryan Long XBM:[id number] DOB: 01/30/1961 DOA: 04/19/2024 PCP: Tretha Fu, MD   Brief HPI:    63 y.o. male with past medical history significant for chronic combined/diastolic CHF/end-stage cardiomyopathy status post AICD (LVEF 20-25% with G1DD ), NSVT, HIV (CD4 count 429, undetectable VL 04/14/2024) , hypertension, hyperlipidemia, COPD, anxiety, depression, bipolar disorder, substance abuse, BPH, CKD stage IIIa (Cr 1.5 - 1.6) currently on home hospice who presented to [facility] ED on 04/19/2024 with complaint of chest pain, dizziness, headache, shortness of breath.    Assessment/Plan:   End-stage systolic heart failure, chronic  Patient presented with symptoms of intermittent lightheadedness chest discomfort and shortness of breath consistent with end-stage congestive heart failure. Patient is already on hospice for his advanced disease with a [facility]; prognosis is poor and ICD was shut off earlier this year.  Patient euvolemic on physical exam with total loss of 45 pounds over the last year. -- Palliative care following, appreciate assistance -- Unlikely candidate for residential hospice at this point but would benefit from discharge with hospice -Patient continues to have episodes of nonsustained V. tach, currently requiring oxygen  2 L/min, has been having episodes of hypotension, tachycardia -- Midodrine  30 mg p.o. 3 times daily -- Morphine  2.5 mg p.o. every 2 hours as needed severe pain -Continue torsemide  20 mg daily   Chest pain Chest pain thought to be secondary to progressive symptoms of end-stage heart failure.  Troponins within normal limits. --Supportive care   Hypokalemia Replete   HIV (human immunodeficiency virus infection)  -- Continue Biktarvy  -- Continue Valtrex  due to history of recurrent HSV -- Outpatient ID follow-up with infectious disease as needed   Paroxysmal atrial fibrillation  -- Continue  mexiletine 250 mg p.o. every 12 hours and amiodarone  200 mg p.o. daily -- not on anticoagulation   Hx Ventricular tachycardia  History of admission for VT storm earlier in the year; ICD now turned off. -Patient having episodes of nonsustained V. tach -- Continue mexiletine 250 mg p.o. every 12 hours and amiodarone  200 mg p.o. daily -Serum magnesium  1.7, replaced, repeat magnesium  2.1   COPD (chronic obstructive pulmonary disease); not in acute exacerbation -- Dulera  2 puffs twice daily -- Albuterol  neb every 6 hours.  Wheezing/shortness of breath   Chronic kidney disease, stage 3a  Baseline creatinine 1.5-1.6. -- Cr 1.40>1.23>1.45> 1.21 -- Avoid nephrotoxins, renally dose all medications   BPH (benign prostatic hyperplasia) -- Finasteride  5 mg p.o. daily -Patient takes tamsulosin  0.8 mg p.o. twice daily.   -Will change it to 0.8 mg p.o. daily    Weakness/debility/deconditioning: -- PT/OT following; TOC consulted      Medications     albuterol   2.5 mg Nebulization BID   amiodarone   200 mg Oral Daily   aspirin  EC  81 mg Oral q AM   bictegravir-emtricitabine -tenofovir  AF  1 tablet Oral Daily   enoxaparin  (LOVENOX ) injection  40 mg Subcutaneous Q24H   feeding supplement  237 mL Oral BID BM   finasteride   5 mg Oral Daily   melatonin  10 mg Oral QHS   mexiletine  250 mg Oral Q12H   midodrine   30 mg Oral TID WC   mometasone -formoterol   2 puff Inhalation BID   senna  2 tablet Oral Daily   sodium chloride  flush  3 mL Intravenous Q12H   tamsulosin   0.4 mg Oral BID   torsemide   20 mg Oral Daily   valACYclovir   500 mg Oral Daily  Data Reviewed:   CBG:  No results for input(s): "GLUCAP" in the last 168 hours.  SpO2: 99 % O2 Flow Rate (L/min): 2 L/min    Vitals:   04/24/24 1223 04/24/24 2015 04/24/24 2115 04/25/24 0448  BP: 93/64  95/73 93/71  Pulse: 99  95 95  Resp: 16  16 18   Temp: 97.8 F (36.6 C)  98.3 F (36.8 C) 98.2 F (36.8 C)  TempSrc: Oral  Oral  Oral  SpO2: 97% 99% 100% 99%  Weight:      Height:          Data Reviewed:  Basic Metabolic Panel: Recent Labs  Lab 04/19/24 1513 04/20/24 0414 04/21/24 0402 04/22/24 0449 04/23/24 0420  NA 140 138 144 138 135  K 2.4* 3.2* 3.5 3.1* 3.7  CL 97* 102 104 104 102  CO2 32 29 30 25 24   GLUCOSE 128* 104* 94 96 100*  BUN 19 17 13 15 16   CREATININE 1.55* 1.40* 1.23 1.45* 1.26*  CALCIUM  8.0* 8.4* 8.9 8.4* 8.4*  MG  --  2.1 1.8 1.7 2.1  PHOS  --   --  3.8  --   --     CBC: Recent Labs  Lab 04/19/24 1513 04/20/24 0414  WBC 2.8* 2.7*  HGB 16.3 15.6  HCT 46.9 46.2  MCV 96.3 98.7  PLT 144* 123*    LFT Recent Labs  Lab 04/21/24 0402  ALBUMIN  3.4*     Antibiotics: Anti-infectives (From admission, onward)    Start     Dose/Rate Route Frequency Ordered Stop   04/20/24 1000  bictegravir-emtricitabine -tenofovir  AF (BIKTARVY ) 50-200-25 MG per tablet 1 tablet        1 tablet Oral Daily 04/19/24 2243     04/20/24 1000  valACYclovir  (VALTREX ) tablet 500 mg        500 mg Oral Daily 04/19/24 2243          DVT prophylaxis: Lovenox   Code Status: DNR  Family Communication: No family at bedside   CONSULTS    Subjective   Denies shortness of breath.  Objective    Physical Examination:  General-appears in no acute distress Heart-S1-S2, regular, no murmur auscultated Lungs-clear to auscultation bilaterally, no wheezing or crackles auscultated Abdomen-soft, nontender, no organomegaly Extremities-1+  edema in the lower extremities Neuro-alert, oriented x3, no focal deficit noted  Status is: Inpatient:             Brinson Tozzi S Lavern Crimi   [facility] If 7PM-7AM, please contact night-coverage at [email or web address], Office  [phone]   04/25/2024, 8:48 AM  LOS: 2 days

## 2024-04-25 NOTE — Progress Notes (Signed)
 VAST consult received to obtain IV access. Pt currently has no IV meds/fluids/studies ordered. Educated unit nurse regarding policy not supporting "just in case" IV placement due to increased risk for infection as well as need for vein preservation. Advised nurse if IV is needed at later time, an IV team consult may be placed and we will come obtain access.

## 2024-04-25 NOTE — Progress Notes (Signed)
 Occupational Therapy Treatment Patient Details Name: Ryan Long MRN: [id number] DOB: September 08, 1961 Today's Date: 04/25/2024   History of present illness 63 yo male admitted with hypokalemia, chest pain, acute on chronic HF. Hx of bipolar d/o, PTSD, HIV, CKD, COPD, depression, anxiety, HF, NSVT, ICD   OT comments  Patient discharged from OT this date.  He is at his max functional potential in the hospital setting, needing no assist for ADL completion or in room mobility/toileting.  Plan appears to be LTC at a local SNF.  Recommend continued mobility with staff in the hospital setting.        If plan is discharge home, recommend the following:  Assist for transportation   Equipment Recommendations  None recommended by OT    Recommendations for Other Services      Precautions / Restrictions Precautions Precautions: None Restrictions Weight Bearing Restrictions Per Provider Order: No       Mobility Bed Mobility Overal bed mobility: Modified Independent                  Transfers Overall transfer level: Modified independent Equipment used: Rollator (4 wheels), None                     Balance Overall balance assessment: No apparent balance deficits (not formally assessed)                                         ADL either performed or assessed with clinical judgement   ADL   Eating/Feeding: Modified independent   Grooming: Modified independent   Upper Body Bathing: Modified independent   Lower Body Bathing: Modified independent   Upper Body Dressing : Modified independent   Lower Body Dressing: Modified independent   Toilet Transfer: Modified Independent   Toileting- Clothing Manipulation and Hygiene: Modified independent              Extremity/Trunk Assessment Upper Extremity Assessment Upper Extremity Assessment: Overall WFL for tasks assessed   Lower Extremity Assessment Lower Extremity Assessment: Defer to PT  evaluation   Cervical / Trunk Assessment Cervical / Trunk Assessment: Normal    Vision Patient Visual Report: No change from baseline     Perception Perception Perception: Within Functional Limits   Praxis Praxis Praxis: WFL   Communication Communication Communication: No apparent difficulties   Cognition Arousal: Alert Behavior During Therapy: WFL for tasks assessed/performed Cognition: No apparent impairments                               Following commands: Intact        Cueing   Cueing Techniques: Verbal cues  Exercises      Shoulder Instructions       General Comments      Pertinent Vitals/ Pain       Pain Assessment Pain Assessment: No/denies pain                                                          Frequency           Progress Toward Goals  OT Goals(current goals can now be found in the care  plan section)  Progress towards OT goals: Goals met/education completed, patient discharged from OT  Acute Rehab OT Goals OT Goal Formulation: With patient Potential to Achieve Goals: Good  Plan      Co-evaluation                 AM-PAC OT "6 Clicks" Daily Activity     Outcome Measure   Help from another person eating meals?: None Help from another person taking care of personal grooming?: None Help from another person toileting, which includes using toliet, bedpan, or urinal?: None Help from another person bathing (including washing, rinsing, drying)?: None Help from another person to put on and taking off regular upper body clothing?: None Help from another person to put on and taking off regular lower body clothing?: None 6 Click Score: 24    End of Session Equipment Utilized During Treatment: Rollator (4 wheels)  OT Visit Diagnosis: Muscle weakness (generalized) (M62.81)   Activity Tolerance Patient tolerated treatment well   Patient Left in chair;with call bell/phone within reach    Nurse Communication Other (comment)        Time: 5784-6962 OT Time Calculation (min): 19 min  Charges: OT General Charges $OT Visit: 1 Visit OT Treatments $Self Care/Home Management : 8-22 mins  04/25/2024  RP, OTR/L  Acute Rehabilitation Services  Office:  [phone]   Benjamen Brand 04/25/2024, 2:30 PM

## 2024-04-25 NOTE — Progress Notes (Signed)
   04/25/24 1153  Assess: MEWS Score  Temp 97.8 F (36.6 C)  BP 93/68  MAP (mmHg) 76  Pulse Rate (!) 106  Resp 18  SpO2 96 %  O2 Device Room Air  Assess: MEWS Score  MEWS Temp 0  MEWS Systolic 1  MEWS Pulse 1  MEWS RR 0  MEWS LOC 0  MEWS Score 2  MEWS Score Color Yellow  Assess: if the MEWS score is Yellow or Red  Were vital signs accurate and taken at a resting state? Yes  Does the patient meet 2 or more of the SIRS criteria? No  MEWS guidelines implemented  Yes, yellow  Treat  MEWS Interventions Considered administering scheduled or prn medications/treatments as ordered  Take Vital Signs  Increase Vital Sign Frequency  Yellow: Q2hr x1, continue Q4hrs until patient remains green for 12hrs  Escalate  MEWS: Escalate Yellow: Discuss with charge nurse and consider notifying provider and/or RRT  Notify: Charge Nurse/RN  Name of Charge Nurse/RN Notified Stephen Ehrlich RN  Assess: SIRS CRITERIA  SIRS Temperature  0  SIRS Respirations  0  SIRS Pulse 1  SIRS WBC 0  SIRS Score Sum  1

## 2024-04-26 DIAGNOSIS — I959 Hypotension, unspecified: Secondary | ICD-10-CM | POA: Diagnosis not present

## 2024-04-26 DIAGNOSIS — R079 Chest pain, unspecified: Secondary | ICD-10-CM | POA: Diagnosis not present

## 2024-04-26 DIAGNOSIS — I5022 Chronic systolic (congestive) heart failure: Secondary | ICD-10-CM | POA: Diagnosis not present

## 2024-04-26 DIAGNOSIS — E876 Hypokalemia: Secondary | ICD-10-CM | POA: Diagnosis not present

## 2024-04-26 NOTE — Progress Notes (Signed)
 [facility]  PROGRESS NOTE  Ryan Long ZOX:[id number] DOB: 1961-09-23 DOA: 04/19/2024 PCP: Tretha Fu, MD   Brief HPI:    63 y.o. male with past medical history significant for chronic combined/diastolic CHF/end-stage cardiomyopathy status post AICD (LVEF 20-25% with G1DD ), NSVT, HIV (CD4 count 429, undetectable VL 04/14/2024) , hypertension, hyperlipidemia, COPD, anxiety, depression, bipolar disorder, substance abuse, BPH, CKD stage IIIa (Cr 1.5 - 1.6) currently on home hospice who presented to [facility] ED on 04/19/2024 with complaint of chest pain, dizziness, headache, shortness of breath.    Assessment/Plan:   End-stage systolic heart failure, chronic  Patient presented with symptoms of intermittent lightheadedness chest discomfort and shortness of breath consistent with end-stage congestive heart failure. Patient is already on hospice for his advanced disease with a [facility]; prognosis is poor and ICD was shut off earlier this year.  Patient euvolemic on physical exam with total loss of 45 pounds over the last year. -- Palliative care following, appreciate assistance -- Unlikely candidate for residential hospice at this point but would benefit from discharge with hospice -Patient continues to have episodes of nonsustained V. tach, currently requiring oxygen  2 L/min, has been having episodes of hypotension, tachycardia -- Midodrine  30 mg p.o. 3 times daily -- Morphine  2.5 mg p.o. every 2 hours as needed severe pain -Continue torsemide  20 mg daily   Chest pain Chest pain thought to be secondary to progressive symptoms of end-stage heart failure.  Troponins within normal limits. --Supportive care   Hypokalemia Replete   HIV (human immunodeficiency virus infection)  -- Continue Biktarvy  -- Continue Valtrex  due to history of recurrent HSV -- Outpatient ID follow-up with infectious disease as needed   Paroxysmal atrial fibrillation  -- Continue  mexiletine 250 mg p.o. every 12 hours and amiodarone  200 mg p.o. daily -- not on anticoagulation   Hx Ventricular tachycardia  History of admission for VT storm earlier in the year; ICD now turned off. -Patient having episodes of nonsustained V. tach -- Continue mexiletine 250 mg p.o. every 12 hours and amiodarone  200 mg p.o. daily -Serum magnesium  1.7, replaced, repeat magnesium  2.1   COPD (chronic obstructive pulmonary disease); not in acute exacerbation -- Dulera  2 puffs twice daily -- Albuterol  neb every 6 hours.  Wheezing/shortness of breath   Chronic kidney disease, stage 3a  Baseline creatinine 1.5-1.6. -- Cr 1.40>1.23>1.45> 1.21 -- Avoid nephrotoxins, renally dose all medications   BPH (benign prostatic hyperplasia) -- Finasteride  5 mg p.o. daily -Patient takes tamsulosin  0.8 mg p.o. twice daily.   -Will change it to 0.8 mg p.o. daily    Weakness/debility/deconditioning: -- PT/OT following; TOC consulted      Medications     albuterol   2.5 mg Nebulization BID   amiodarone   200 mg Oral Daily   aspirin  EC  81 mg Oral q AM   bictegravir-emtricitabine -tenofovir  AF  1 tablet Oral Daily   enoxaparin  (LOVENOX ) injection  40 mg Subcutaneous Q24H   feeding supplement  237 mL Oral BID BM   finasteride   5 mg Oral Daily   melatonin  10 mg Oral QHS   mexiletine  250 mg Oral Q12H   midodrine   30 mg Oral TID WC   mometasone -formoterol   2 puff Inhalation BID   senna  2 tablet Oral Daily   sodium chloride  flush  3 mL Intravenous Q12H   tamsulosin   0.8 mg Oral Daily   torsemide   20 mg Oral Daily   valACYclovir   500 mg Oral Daily  Data Reviewed:   CBG:  No results for input(s): "GLUCAP" in the last 168 hours.  SpO2: 97 % O2 Flow Rate (L/min): 2 L/min    Vitals:   04/25/24 1811 04/25/24 1943 04/25/24 2137 04/26/24 0546  BP: 95/73  98/75 100/70  Pulse: 100  96 100  Resp: (!) 22  18 18   Temp: 97.7 F (36.5 C)  97.6 F (36.4 C) 97.7 F (36.5 C)  TempSrc: Oral   Oral Oral  SpO2: 100% 97% 100% 97%  Weight:      Height:          Data Reviewed:  Basic Metabolic Panel: Recent Labs  Lab 04/20/24 0414 04/21/24 0402 04/22/24 0449 04/23/24 0420 04/25/24 1104  NA 138 144 138 135 137  K 3.2* 3.5 3.1* 3.7 3.7  CL 102 104 104 102 101  CO2 29 30 25 24 26   GLUCOSE 104* 94 96 100* 106*  BUN 17 13 15 16 14   CREATININE 1.40* 1.23 1.45* 1.26* 1.21  CALCIUM  8.4* 8.9 8.4* 8.4* 8.6*  MG 2.1 1.8 1.7 2.1  --   PHOS  --  3.8  --   --   --     CBC: Recent Labs  Lab 04/19/24 1513 04/20/24 0414  WBC 2.8* 2.7*  HGB 16.3 15.6  HCT 46.9 46.2  MCV 96.3 98.7  PLT 144* 123*    LFT Recent Labs  Lab 04/21/24 0402  ALBUMIN  3.4*     Antibiotics: Anti-infectives (From admission, onward)    Start     Dose/Rate Route Frequency Ordered Stop   04/20/24 1000  bictegravir-emtricitabine -tenofovir  AF (BIKTARVY ) 50-200-25 MG per tablet 1 tablet        1 tablet Oral Daily 04/19/24 2243     04/20/24 1000  valACYclovir  (VALTREX ) tablet 500 mg        500 mg Oral Daily 04/19/24 2243          DVT prophylaxis: Lovenox   Code Status: DNR  Family Communication: No family at bedside   CONSULTS    Subjective   Denies shortness of breath  Objective    Physical Examination:  Appears in no acute distress S1-S2, regular Lungs clear to auscultation bilaterally Abdomen is soft, nontender, no organomegaly  Status is: Inpatient:             Ryan Long S [name]   [facility] If 7PM-7AM, please contact night-coverage at [email or web address], Office  [phone]   04/26/2024, 8:49 AM  LOS: 3 days

## 2024-04-27 DIAGNOSIS — I959 Hypotension, unspecified: Secondary | ICD-10-CM | POA: Diagnosis not present

## 2024-04-27 DIAGNOSIS — I5022 Chronic systolic (congestive) heart failure: Secondary | ICD-10-CM | POA: Diagnosis not present

## 2024-04-27 DIAGNOSIS — E876 Hypokalemia: Secondary | ICD-10-CM | POA: Diagnosis not present

## 2024-04-27 DIAGNOSIS — R079 Chest pain, unspecified: Secondary | ICD-10-CM | POA: Diagnosis not present

## 2024-04-27 LAB — BASIC METABOLIC PANEL WITH GFR
Anion gap: 14 (ref 5–15)
BUN: 14 mg/dL (ref 8–23)
CO2: 22 mmol/L (ref 22–32)
Calcium: 8.4 mg/dL — ABNORMAL LOW (ref 8.9–10.3)
Chloride: 103 mmol/L (ref 98–111)
Creatinine, Ser: 1.3 mg/dL — ABNORMAL HIGH (ref 0.61–1.24)
GFR, Estimated: >60 mL/min (ref >60)
Glucose, Bld: 137 mg/dL — ABNORMAL HIGH (ref 70–99)
Potassium: 2.9 mmol/L — ABNORMAL LOW (ref 3.5–5.1)
Sodium: 139 mmol/L (ref 135–145)

## 2024-04-27 MED ORDER — POLYETHYLENE GLYCOL 3350 17 G PO PACK
17.0000 g | PACK | Freq: Every day | ORAL | Status: DC | PRN
  Administered 2024-04-28 – 2024-05-04 (×6): 17 g via ORAL
  Filled 2024-04-27 (×7): qty 1

## 2024-04-27 MED ORDER — POTASSIUM CHLORIDE 20 MEQ PO PACK
40.0000 meq | PACK | ORAL | Status: AC
  Administered 2024-04-27 (×2): 40 meq via ORAL
  Filled 2024-04-27 (×2): qty 2

## 2024-04-27 NOTE — Progress Notes (Signed)
 [facility]  PROGRESS NOTE  Ryan Long [id number]:[id number] DOB: 1961/09/07 DOA: 04/19/2024 PCP: Tretha Fu, MD   Brief HPI:    63 y.o. male with past medical history significant for chronic combined/diastolic CHF/end-stage cardiomyopathy status post AICD (LVEF 20-25% with G1DD ), NSVT, HIV (CD4 count 429, undetectable VL 04/14/2024) , hypertension, hyperlipidemia, COPD, anxiety, depression, bipolar disorder, substance abuse, BPH, CKD stage IIIa (Cr 1.5 - 1.6) currently on home hospice who presented to [facility] ED on 04/19/2024 with complaint of chest pain, dizziness, headache, shortness of breath.    Assessment/Plan:   End-stage systolic heart failure, chronic  Patient presented with symptoms of intermittent lightheadedness chest discomfort and shortness of breath consistent with end-stage congestive heart failure. Patient is already on hospice for his advanced disease with a [facility]; prognosis is poor and ICD was shut off earlier this year.  Patient euvolemic on physical exam with total loss of 45 pounds over the last year. -- Palliative care following, appreciate assistance -- Unlikely candidate for residential hospice at this point but would benefit from discharge with hospice -Patient continues to have episodes of nonsustained V. tach, currently requiring oxygen  2 L/min, has been having episodes of hypotension, tachycardia -- Midodrine  30 mg p.o. 3 times daily -- Morphine  2.5 mg p.o. every 2 hours as needed severe pain -Continue torsemide  20 mg daily   Chest pain Chest pain thought to be secondary to progressive symptoms of end-stage heart failure.  Troponins within normal limits. --Supportive care   Hypokalemia Potassium was 2.9 today -Replace potassium and follow BMP in am   HIV (human immunodeficiency virus infection)  -- Continue Biktarvy  -- Continue Valtrex  due to history of recurrent HSV -- Outpatient ID follow-up with infectious disease as  needed   Paroxysmal atrial fibrillation  -- Continue mexiletine 250 mg p.o. every 12 hours and amiodarone  200 mg p.o. daily -- not on anticoagulation   Hx Ventricular tachycardia  History of admission for VT storm earlier in the year; ICD now turned off. -Patient having episodes of nonsustained V. tach -- Continue mexiletine 250 mg p.o. every 12 hours and amiodarone  200 mg p.o. daily -Serum magnesium  1.7, replaced, repeat magnesium  2.1   COPD (chronic obstructive pulmonary disease); not in acute exacerbation -- Dulera  2 puffs twice daily -- Albuterol  neb every 6 hours.  Wheezing/shortness of breath   Chronic kidney disease, stage 3a  Baseline creatinine 1.5-1.6. -- Cr 1.40>1.23>1.45> 1.21 -- Avoid nephrotoxins, renally dose all medications   BPH (benign prostatic hyperplasia) -- Finasteride  5 mg p.o. daily -Patient takes tamsulosin  0.8 mg p.o. twice daily.   -Will change it to 0.8 mg p.o. daily    Weakness/debility/deconditioning: -- PT/OT following; TOC consulted      Medications     albuterol   2.5 mg Nebulization BID   amiodarone   200 mg Oral Daily   aspirin  EC  81 mg Oral q AM   bictegravir-emtricitabine -tenofovir  AF  1 tablet Oral Daily   enoxaparin  (LOVENOX ) injection  40 mg Subcutaneous Q24H   feeding supplement  237 mL Oral BID BM   finasteride   5 mg Oral Daily   melatonin  10 mg Oral QHS   mexiletine  250 mg Oral Q12H   midodrine   30 mg Oral TID WC   mometasone -formoterol   2 puff Inhalation BID   senna  2 tablet Oral Daily   sodium chloride  flush  3 mL Intravenous Q12H   tamsulosin   0.8 mg Oral Daily   torsemide   20 mg Oral  Daily   valACYclovir   500 mg Oral Daily     Data Reviewed:   CBG:  No results for input(s): "GLUCAP" in the last 168 hours.  SpO2: 98 % O2 Flow Rate (L/min): 2 L/min    Vitals:   04/26/24 1607 04/26/24 1826 04/26/24 2037 04/27/24 0504  BP: 91/71  96/63 94/71  Pulse: 90  83 95  Resp: 19     Temp: 97.7 F (36.5 C)  97.9  F (36.6 C) 98.2 F (36.8 C)  TempSrc: Oral  Axillary Oral  SpO2: 97% 99% 96% 98%  Weight:      Height:          Data Reviewed:  Basic Metabolic Panel: Recent Labs  Lab 04/21/24 0402 04/22/24 0449 04/23/24 0420 04/25/24 1104  NA 144 138 135 137  K 3.5 3.1* 3.7 3.7  CL 104 104 102 101  CO2 30 25 24 26   GLUCOSE 94 96 100* 106*  BUN 13 15 16 14   CREATININE 1.23 1.45* 1.26* 1.21  CALCIUM  8.9 8.4* 8.4* 8.6*  MG 1.8 1.7 2.1  --   PHOS 3.8  --   --   --     CBC: No results for input(s): "WBC", "NEUTROABS", "HGB", "HCT", "MCV", "PLT" in the last 168 hours.   LFT Recent Labs  Lab 04/21/24 0402  ALBUMIN  3.4*     Antibiotics: Anti-infectives (From admission, onward)    Start     Dose/Rate Route Frequency Ordered Stop   04/20/24 1000  bictegravir-emtricitabine -tenofovir  AF (BIKTARVY ) 50-200-25 MG per tablet 1 tablet        1 tablet Oral Daily 04/19/24 2243     04/20/24 1000  valACYclovir  (VALTREX ) tablet 500 mg        500 mg Oral Daily 04/19/24 2243          DVT prophylaxis: Lovenox   Code Status: DNR  Family Communication: No family at bedside   CONSULTS    Subjective   Denies shortness of breath  Objective    Physical Examination:  General-appears in no acute distress Heart-S1-S2, regular, no murmur auscultated Lungs-clear to auscultation bilaterally, no wheezing or crackles auscultated Abdomen-soft, nontender, no organomegaly Extremities-trace  edema in the lower extremities Neuro-alert, oriented x3, no focal deficit noted  Status is: Inpatient:             Ryan Long S Ryan Long   [facility] If 7PM-7AM, please contact night-coverage at [email or web address], Office  [phone]   04/27/2024, 8:09 AM  LOS: 4 days

## 2024-04-28 DIAGNOSIS — Z21 Asymptomatic human immunodeficiency virus [HIV] infection status: Secondary | ICD-10-CM

## 2024-04-28 DIAGNOSIS — I959 Hypotension, unspecified: Secondary | ICD-10-CM | POA: Diagnosis not present

## 2024-04-28 DIAGNOSIS — I480 Paroxysmal atrial fibrillation: Secondary | ICD-10-CM | POA: Diagnosis not present

## 2024-04-28 DIAGNOSIS — E876 Hypokalemia: Secondary | ICD-10-CM | POA: Diagnosis not present

## 2024-04-28 DIAGNOSIS — I5022 Chronic systolic (congestive) heart failure: Secondary | ICD-10-CM | POA: Diagnosis not present

## 2024-04-28 LAB — BASIC METABOLIC PANEL WITH GFR
Anion gap: 9 (ref 5–15)
BUN: 17 mg/dL (ref 8–23)
CO2: 25 mmol/L (ref 22–32)
Calcium: 8.4 mg/dL — ABNORMAL LOW (ref 8.9–10.3)
Chloride: 103 mmol/L (ref 98–111)
Creatinine, Ser: 1.34 mg/dL — ABNORMAL HIGH (ref 0.61–1.24)
GFR, Estimated: 60 mL/min — ABNORMAL LOW (ref >60)
Glucose, Bld: 95 mg/dL (ref 70–99)
Potassium: 3.5 mmol/L (ref 3.5–5.1)
Sodium: 137 mmol/L (ref 135–145)

## 2024-04-28 MED ORDER — POTASSIUM CHLORIDE 20 MEQ PO PACK: 20.0000 meq | PACK | Freq: Every day | ORAL | Status: DC

## 2024-04-28 MED ORDER — SENNA 8.6 MG PO TABS: 2.0000 | ORAL_TABLET | Freq: Every day | ORAL | Status: DC | PRN

## 2024-04-28 MED ORDER — MIDODRINE HCL 10 MG PO TABS: 30.0000 mg | ORAL_TABLET | Freq: Three times a day (TID) | ORAL | Status: DC

## 2024-04-28 MED ORDER — LORAZEPAM 0.5 MG PO TABS: 0.5000 mg | ORAL_TABLET | Freq: Three times a day (TID) | ORAL | 0 refills | Status: DC | PRN

## 2024-04-28 MED ORDER — POTASSIUM CHLORIDE 20 MEQ PO PACK
20.0000 meq | PACK | Freq: Every day | ORAL | Status: AC
  Administered 2024-04-28 – 2024-04-29 (×2): 20 meq via ORAL
  Filled 2024-04-28 (×2): qty 1

## 2024-04-28 MED ORDER — TORSEMIDE 20 MG PO TABS: 20.0000 mg | ORAL_TABLET | Freq: Every day | ORAL | Status: DC

## 2024-04-28 MED ORDER — MORPHINE SULFATE 10 MG/5ML PO SOLN: 2.6000 mg | Freq: Four times a day (QID) | ORAL | 0 refills | Status: DC | PRN

## 2024-04-28 MED ORDER — TAMSULOSIN HCL 0.4 MG PO CAPS: 0.8000 mg | ORAL_CAPSULE | Freq: Every day | ORAL | Status: DC

## 2024-04-28 NOTE — Progress Notes (Deleted)
 Psychiatric Initial Adult Assessment   Patient Identification: Ryan Long MRN:  [id number] Date of Evaluation:  04/28/2024 Referral Source: *** Chief Complaint:  No chief complaint on file.  Visit Diagnosis: No diagnosis found.   Assessment:  Ryan Long is a 63 y.o. male with a history of *** who presents virtually to [facility] for initial evaluation on 04/28/2024.  At initial evaluation patient reports ***  A number of assessments were performed during the evaluation today including PHQ-9 which they scored a *** on, GAD-7 which they scored a *** on, and [place] suicide severity screening which showed ***.    Risk Assessment: A suicide and violence risk assessment was performed as part of this evaluation. There patient is deemed to be at chronic elevated risk for self-harm/suicide given the following factors: {SABSUICIDERISKFACTORS:29780}. These risk factors are mitigated by the following factors: {SABSUICIDEPROTECTIVEFACTORS:29779}. The patient is deemed to be at chronic elevated risk for violence given the following factors: {SABVIOLENCERISKFACTORS:29781}. These risk factors are mitigated by the following factors: {SABVIOLENCEPROTECTIVEFACTORS:29782}. There is no *** acute risk for suicide or violence at this time. The patient was educated about relevant modifiable risk factors including following recommendations for treatment of psychiatric illness and abstaining from substance abuse.  While future psychiatric events cannot be accurately predicted, the patient does not *** currently require  acute inpatient psychiatric care and does not *** currently meet [place]  involuntary commitment criteria.  Patient was given contact information for crisis resources, behavioral health clinic and was instructed to call 911 for emergencies.     Plan: # *** Past medication trials:  Status of problem: *** Interventions: -- ***  #  *** Past medication trials:  Status of problem: *** Interventions: -- ***  # *** Past medication trials:  Status of problem: *** Interventions: -- ***  History of Present Illness:  ***  Associated Signs/Symptoms: Depression Symptoms:  {DEPRESSION SYMPTOMS:20000} (Hypo) Manic Symptoms:  {BHH MANIC SYMPTOMS:22872} Anxiety Symptoms:  {BHH ANXIETY SYMPTOMS:22873} Psychotic Symptoms:  {BHH PSYCHOTIC SYMPTOMS:22874} PTSD Symptoms: {BHH PTSD SYMPTOMS:22875}  Past Psychiatric History:  Past psychiatric diagnoses: *** Psychiatric hospitalizations:*** Past suicide attempts: *** Hx of self harm: *** Hx of violence towards others: *** Prior psychiatric providers: *** Prior therapy: *** Access to firearms: ***  Prior medication trials: ***  Substance use: ***  Past Medical History:  Past Medical History:  Diagnosis Date   Active smoker    AICD (automatic cardioverter/defibrillator) present    Alcohol  abuse    Allergy July 2023   Anxiety    AR (allergic rhinitis)    Bipolar 1 disorder (HCC)    Cervical lymphadenitis 04/20/2021   CHF (congestive heart failure) (HCC)    Chronic bronchitis (HCC)    Chronic systolic heart failure (HCC)    Controlled substance agreement signed 10/22/2018   COPD (chronic obstructive pulmonary disease) (HCC)    COPD (chronic obstructive pulmonary disease) (HCC) 10/04/2015   Cough 12/31/2018   Crack cocaine use    Depression    Genital herpes    HIV (human immunodeficiency virus infection) (HCC) dx'd 08/1993   HLD (hyperlipidemia)    Hypertension    NICM (nonischemic cardiomyopathy) (HCC)    Echocardiogram 06/28/11: EF 30-35%, mild MR, mild LAE;  No CAD by coronary CT angi[facility] 3/12 at [facility]   NSVT (nonsustained ventricular tachycardia) (HCC)    PTSD (post-traumatic stress disorder)     Past Surgical History:  Procedure Laterality Date   CARDIAC DEFIBRILLATOR PLACEMENT  01/08/2018   ICD IMPLANT N/A 01/08/2018    Procedure: ICD IMPLANT;  Surgeon: Lei Pump, MD;  Location: [facility] INVASIVE CV LAB;  Service: Cardiovascular;  Laterality: N/A;   RIGHT/LEFT HEART CATH AND CORONARY ANGIOGRAPHY N/A 01/03/2024   Procedure: RIGHT/LEFT HEART CATH AND CORONARY ANGIOGRAPHY;  Surgeon: Mardell Shade, MD;  Location: [facility] INVASIVE CV LAB;  Service: Cardiovascular;  Laterality: N/A;    Family Psychiatric History: ***  Family History:  Family History  Problem Relation Age of Onset   Glaucoma Mother    Mental illness Mother    Vision loss Mother    Hypertension Father    CAD Father    Mental illness Father    Alcohol  abuse Father    Drug abuse Father    Heart attack Father 12   Early death Father    Heart disease Father    Alcohol  abuse Brother    Drug abuse Brother    Drug abuse Brother     Social History:   Social History   Socioeconomic History   Marital status: Single    Spouse name: Not on file   Number of children: Not on file   Years of education: Not on file   Highest education level: GED or equivalent  Occupational History   Not on file  Tobacco Use   Smoking status: Every Day    Current packs/day: 0.75    Average packs/day: 0.8 packs/day for 42.0 years (31.5 ttl pk-yrs)    Types: Cigarettes   Smokeless tobacco: Never   Tobacco comments:    hoping welbutrin will help  Vaping Use   Vaping status: Never Used  Substance and Sexual Activity   Alcohol  use: Not Currently    Alcohol /week: 6.0 standard drinks of alcohol     Comment: 01/08/2018 "stopped 06/2017"   Drug use: Not Currently    Types: "Crack" cocaine    Comment: 01/08/2018 "stopped 06/2017"   Sexual activity: Yes    Birth control/protection: Condom    Comment: pt. declined condoms  Other Topics Concern   Not on file  Social History Narrative   Not on file   Social Drivers of Health   Financial Resource Strain: Low Risk  (07/17/2023)   Overall Financial Resource Strain (CARDIA)    Difficulty of Paying Living  Expenses: Not hard at all  Food Insecurity: No Food Insecurity (04/19/2024)   Hunger Vital Sign    Worried About Running Out of Food in the Last Year: Never true    Ran Out of Food in the Last Year: Never true  Transportation Needs: No Transportation Needs (04/19/2024)   PRAPARE - Administrator, Civil Service (Medical): No    Lack of Transportation (Non-Medical): No  Physical Activity: Insufficiently Active (07/17/2023)   Exercise Vital Sign    Days of Exercise per Week: 3 days    Minutes of Exercise per Session: 20 min  Stress: Stress Concern Present (07/17/2023)   [employer] of Occupational Health - Occupational Stress Questionnaire    Feeling of Stress : To some extent  Social Connections: Socially Isolated (04/19/2024)   Social Connection and Isolation Panel [NHANES]    Frequency of Communication with Friends and Family: Once a week    Frequency of Social Gatherings with Friends and Family: Never    Attends Religious Services: More than 4 times per year    Active Member of [employer] or Organizations: No    Attends Banker Meetings: Never  Marital Status: Never married    Additional Social History: ***  Allergies:  No Known Allergies  Metabolic Disorder Labs: Lab Results  Component Value Date   HGBA1C 5.7 (H) 06/25/2020   MPG 116.89 06/25/2020   MPG 105 09/24/2015   Lab Results  Component Value Date   PROLACTIN 32.7 (H) 08/08/2015   Lab Results  Component Value Date   CHOL 191 04/14/2024   TRIG 97 04/14/2024   HDL 71 04/14/2024   CHOLHDL 2.7 04/14/2024   VLDL 31 06/25/2020   LDLCALC 101 (H) 04/14/2024   LDLCALC 96 03/10/2021   Lab Results  Component Value Date   TSH 0.797 08/14/2023    Therapeutic Level Labs: No results found for: "LITHIUM" No results found for: "CBMZ" No results found for: "VALPROATE"  Current Medications: No current facility-administered medications for this visit.   No current outpatient medications on file.    Facility-Administered Medications Ordered in Other Visits  Medication Dose Route Frequency Provider Last Rate Last Admin   acetaminophen  (TYLENOL ) tablet 650 mg  650 mg Oral Q6H PRN Smith, Rondell A, MD   650 mg at 04/28/24 1610   Or   acetaminophen  (TYLENOL ) suppository 650 mg  650 mg Rectal Q6H PRN Manny Sees A, MD       albuterol  (PROVENTIL ) (2.5 MG/3ML) 0.083% nebulizer solution 2.5 mg  2.5 mg Nebulization Q6H PRN Smith, Rondell A, MD   2.5 mg at 04/24/24 2014   albuterol  (PROVENTIL ) (2.5 MG/3ML) 0.083% nebulizer solution 2.5 mg  2.5 mg Nebulization BID Lama, Gagan S, MD   2.5 mg at 04/28/24 0836   amiodarone  (PACERONE ) tablet 200 mg  200 mg Oral Daily Felipe Ryan, Rondell A, MD   200 mg at 04/28/24 0842   aspirin  EC tablet 81 mg  81 mg Oral q AM Smith, Rondell A, MD   81 mg at 04/28/24 9604   bictegravir-emtricitabine -tenofovir  AF (BIKTARVY ) 50-200-25 MG per tablet 1 tablet  1 tablet Oral Daily Manny Sees A, MD   1 tablet at 04/28/24 0842   docusate sodium  (COLACE) capsule 100 mg  100 mg Oral BID PRN Shalhoub, George J, MD   100 mg at 04/27/24 1214   enoxaparin  (LOVENOX ) injection 40 mg  40 mg Subcutaneous Q24H Smith, Rondell A, MD   40 mg at 04/27/24 2150   feeding supplement (ENSURE ENLIVE / ENSURE PLUS) liquid 237 mL  237 mL Oral BID BM Smith, Rondell A, MD   237 mL at 04/28/24 0844   finasteride  (PROSCAR ) tablet 5 mg  5 mg Oral Daily Manny Sees A, MD   5 mg at 04/28/24 5409   lip balm (CARMEX) ointment   Topical PRN [place], Eric J, DO   Given at 04/22/24 1704   LORazepam  (ATIVAN ) tablet 0.5 mg  0.5 mg Oral Q4H PRN [place], Eric J, DO   0.5 mg at 04/28/24 0858   melatonin tablet 10 mg  10 mg Oral QHS Smith, Rondell A, MD   10 mg at 04/27/24 2151   mexiletine (MEXITIL ) capsule 250 mg  250 mg Oral Q12H Naasz, Hayley N, MD   250 mg at 04/28/24 8119   midodrine  (PROAMATINE ) tablet 30 mg  30 mg Oral TID WC [place], Eric J, DO   30 mg at 04/28/24 1478   mometasone -formoterol  (DULERA )  100-5 MCG/ACT inhaler 2 puff  2 puff Inhalation BID Manny Sees A, MD   2 puff at 04/28/24 0836   morphine  10 MG/5ML solution 2.5 mg  2.5 mg Oral  Q2H PRN Shalhoub, George J, MD   2.5 mg at 04/28/24 0859   ondansetron  (ZOFRAN ) injection 4 mg  4 mg Intravenous Q6H PRN Chavez, Abigail, NP   4 mg at 04/19/24 2325   Oral care mouth rinse  15 mL Mouth Rinse PRN [place], Eric J, DO       polyethylene glycol (MIRALAX  / GLYCOLAX ) packet 17 g  17 g Oral Daily PRN Lama, Gagan S, MD   17 g at 04/28/24 0841   potassium chloride  (KLOR-CON ) packet 20 mEq  20 mEq Oral Daily Ozell Blunt, MD   20 mEq at 04/28/24 0857   senna (SENOKOT) tablet 17.2 mg  2 tablet Oral Daily Ozell Blunt, MD   17.2 mg at 04/27/24 8413   sodium chloride  flush (NS) 0.9 % injection 3 mL  3 mL Intravenous Q12H Smith, Rondell A, MD   3 mL at 04/28/24 2440   tamsulosin  (FLOMAX ) capsule 0.8 mg  0.8 mg Oral Daily Lama, Gagan S, MD   0.8 mg at 04/28/24 1027   torsemide  (DEMADEX ) tablet 20 mg  20 mg Oral Daily [place], Eric J, DO   20 mg at 04/28/24 2536   traZODone  (DESYREL ) tablet 150 mg  150 mg Oral QHS PRN Shalhoub, George J, MD       trimethobenzamide  (TIGAN ) injection 200 mg  200 mg Intramuscular Q6H PRN Lena Qualia, MD       valACYclovir  (VALTREX ) tablet 500 mg  500 mg Oral Daily Manny Sees A, MD   500 mg at 04/28/24 6440    Psychiatric Specialty Exam:  Psychiatric Specialty Exam: There were no vitals taken for this visit.There is no height or weight on file to calculate BMI.  General Appearance: {Appearance:22683}  Eye Contact:  {BHH EYE CONTACT:22684}  Speech:  {Speech:22685}  Volume:  {Volume (PAA):22686}  Mood:  {BHH MOOD:22306}  Affect:  {Affect (PAA):22687}  Thought Content: {Thought Content:22690}   Suicidal Thoughts:  {ST/HT (PAA):22692}  Homicidal Thoughts:  {ST/HT (PAA):22692}  Thought Process:  {Thought Process (PAA):22688}  Orientation:  {BHH ORIENTATION (PAA):22689}    Memory: {BHH MEMORY:22881}   Judgment:  {Judgement (PAA):22694}  Insight:  {Insight (PAA):22695}  Concentration:  {Concentration:21399}  Recall:  not formally assessed ***  Fund of Knowledge: {BHH GOOD/FAIR/POOR:22877}  Language: {BHH GOOD/FAIR/POOR:22877}  Psychomotor Activity:  {Psychomotor (PAA):22696}  Akathisia:  {BHH YES OR NO:22294}  AIMS (if indicated): {Desc; done/not:10129}  Assets:  {Assets (PAA):22698}  ADL's:  {BHH HKV'Q:25956}  Cognition: {chl bhh cognition:304700322}  Sleep:  {BHH GOOD/FAIR/POOR:22877}    Screenings: AIMS    Flowsheet Row [facility]) [facility] (Discharged) from 10/08/2015 [facility] Admission (Discharged) [facility] Admission (Discharged) [facility]  AIMS Total Score 0 0 0 0      AUDIT    Flowsheet Row Ad[facility] (Discharged) [facility] Admission (Discharged) from 10/08/2015 in [facility] Admission (Discharged) from 09[facility]23[facility]2015 [facility] Admission (Discharged) from 08/06/2015 [facility] Admission (Discharged) from 08/28/2014 in [facility]  Alcohol  Use Disorder Identification Test Final Score (AUDIT) 15 12 1 1 6       GAD-7    Flowsheet Row Office Visit from 08/01/2023 in [facility] [place] - A Dept Of [place]. [facility]  Office Visit from 09/12/2021 in [facility] - A Dept Of Tommas Fragmin. [facility] Office Visit from 05/23/2021 in [facility] [place] - A Dept Of [place] [facility]ag[facility]. [facility] Office Visit from 04/25/2021 in [facility] [place] - A Dept Of [facility]. [facility] Office Visit from 03/22/2021 in [facility]  [facility] [place] - A Dept Of [place] [facility]. [facility]  Total GAD-7 Score 0 5 1 0 5      Mini-Mental    Flowsheet Row Office Visit from 11/15/2022 in [facility] [place] - A Dept Of [place]. [facility]  Total Score (max 30 points ) 30      PHQ2-9    Flowsheet Row Office Visit from 08/01/2023 in [facility] [place] - A Dept Of [place]. [facility] Clinical Support from 07/18/2023 in [facility] [place] - A Dept Of [place]. [facility] Office Visit from 12/19/2022 in [facility] - A Dept Of [place]. [facility] Office Visit from 08/08/2022 in [facility] Infect Dis - A Dept Of [place]. [facility] Video Visit from 10/03/2021 in [facility] - A Dept Of [facility] Fragmin. [facility]  PHQ-2 Total Score 0 0 0 0 2  PHQ-9 Total Score -- -- -- -- 6      Flowsheet Row ED to Hosp-Admission (Current) from 04/19/2024 in [place] [facility] 4TH FLOOR PROGRESSIVE CARE AND UROLOGY ED from 03/26/2024 in [facility] Emergency Department at [facility] ED to Hosp-Admission (Discharged) from 03/17/2024 in [facility] 3E HF PCU  C-SSRS RISK CATEGORY No Risk No Risk No Risk        Collaboration of Care: {BH OP Collaboration of Care:21014065}  Patient/Guardian was advised Release of Information must be obtained prior to any record release in order to collaborate their care with an outside provider. Patient/Guardian was advised if they have not already done so to contact the registration department to sign all necessary forms in order for us  to release information regarding their care.   Consent: Patient/Guardian gives verbal consent for treatment and assignment of benefits for services provided during this visit. Patient/Guardian expressed understanding and agreed to proceed.   Yves Herb, MD 5/12/202510:43 AM    Virtual Visit via Video Note  I connected  with Ryan Long on 04/28/24 at  9:00 AM EDT by a video enabled telemedicine application and verified that I am speaking with the correct person using two identifiers.  Location: Patient: Home Provider: Home office   I discussed the limitations of evaluation and management by telemedicine and the availability of in person appointments. The patient expressed understanding and agreed to proceed.   I discussed the assessment and treatment plan with the patient. The patient was provided an opportunity to ask questions and all were answered. The patient agreed with the plan and demonstrated an understanding of the instructions.   The patient was advised to call back or seek an in-person evaluation if the symptoms worsen or if the condition fails to improve as anticipated.  I provided *** minutes of non-face-to-face time during this encounter.   Yves Herb, MD

## 2024-04-28 NOTE — Discharge Summary (Addendum)
 Physician Discharge Summary   Patient: Ryan Long MRN: [id number] DOB: 06/22/1961  Admit date:     04/19/2024  Discharge date: 04/28/24  Discharge Physician: Ozell Blunt   PCP: Tretha Fu, MD   Recommendations at discharge:   Patient to be discharged to skilled nursing facility with hospice  Discharge Diagnoses: Principal Problem:   Hypokalemia Active Problems:   End-stage systolic heart failure, chronic (HCC)   Chest pain   Hypotension   Paroxysmal atrial fibrillation (HCC)   HIV (human immunodeficiency virus infection) (HCC)   COPD (chronic obstructive pulmonary disease) (HCC)   Chronic kidney disease, stage 3a (HCC)   BPH (benign prostatic hyperplasia)   Debility   Goals of care, counseling/discussion   Ventricular tachycardia (HCC)   Malnutrition of moderate degree   Acute CHF (HCC)  Resolved Problems:   * No resolved hospital problems. *  Hospital Course:  63 y.o. male with past medical history significant for chronic combined/diastolic CHF/end-stage cardiomyopathy status post AICD (LVEF 20-25% with G1DD ), NSVT, HIV (CD4 count 429, undetectable VL 04/14/2024) , hypertension, hyperlipidemia, COPD, anxiety, depression, bipolar disorder, substance abuse, BPH, CKD stage IIIa (Cr 1.5 - 1.6) currently on home hospice who presented to [facility] ED on 04/19/2024 with complaint of chest pain, dizziness, headache, shortness of breath.   Assessment and Plan:    End-stage systolic heart failure, chronic  Patient presented with symptoms of intermittent lightheadedness chest discomfort and shortness of breath consistent with end-stage congestive heart failure. Patient is already on hospice for his advanced disease with a [facility]; prognosis is poor and ICD was shut off earlier this year.  Patient euvolemic on physical exam with total loss of 45 pounds over the last year. -- Palliative care following, appreciate assistance -- Unlikely candidate for  residential hospice at this point but would benefit from discharge with hospice -Patient continues to have episodes of nonsustained V. tach, currently requiring oxygen  2 L/min, has been having episodes of hypotension, tachycardia -- Midodrine  30 mg p.o. 3 times daily -- Morphine  2.5 mg p.o. every 6 hours as needed severe pain -Continue torsemide  20 mg daily   Chest pain Chest pain thought to be secondary to progressive symptoms of end-stage heart failure.  Troponins within normal limits. --Supportive care   Hypokalemia Replete   HIV (human immunodeficiency virus infection)  -- Continue Biktarvy  -- Continue Valtrex  due to history of recurrent HSV -- Outpatient ID follow-up with infectious disease as needed   Paroxysmal atrial fibrillation  -- Continue mexiletine 250 mg p.o. every 12 hours and amiodarone  200 mg p.o. daily -- not on anticoagulation   Hx Ventricular tachycardia  History of admission for VT storm earlier in the year; ICD now turned off. -Patient having episodes of nonsustained V. tach -- Continue mexiletine 250 mg p.o. every 12 hours and amiodarone  200 mg p.o. daily - Serum magnesium  replaced, last magnesium  was 2.1.   COPD (chronic obstructive pulmonary disease); not in acute exacerbation -- Dulera  2 puffs twice daily -- Albuterol  neb every 6 hours.  Wheezing/shortness of breath   Chronic kidney disease, stage 3a  Baseline creatinine 1.5-1.6. -- Cr 1.40>1.23>1.45> 1.21> 1.34 -- Avoid nephrotoxins, renally dose all medications   BPH (benign prostatic hyperplasia) -- Finasteride  5 mg p.o. daily -Patient takes tamsulosin  0.8 mg p.o. twice daily.   -Will change it to 0.8 mg p.o. daily     Weakness/debility/deconditioning: -- PT/OT following; patient to go with hospice to skilled nursing facility   Goals of care -  Patient has end-stage CHF, has been with hospice.  Want to switch hospice to [facility] care.  At this time patient will be discharged to skilled nursing  facility with hospice.     Consultants:  Procedures performed:  Disposition: Skilled nursing facility Diet recommendation:  Discharge Diet Orders (From admission, onward)     Start     Ordered   04/28/24 0000  Diet - low sodium heart healthy        04/28/24 1349           Regular diet DISCHARGE MEDICATION: Allergies as of 04/28/2024   No Known Allergies      Medication List     STOP taking these medications    oxyCODONE  5 MG immediate release tablet Commonly known as: Oxy IR/ROXICODONE    triamcinolone  ointment 0.5 % Commonly known as: KENALOG        TAKE these medications    acetaminophen  325 MG tablet Commonly known as: TYLENOL  Take 2 tablets (650 mg total) by mouth every 6 (six) hours as needed for mild pain (pain score 1-3) (or Fever >/= 101).   albuterol  108 (90 Base) MCG/ACT inhaler Commonly known as: VENTOLIN  HFA Inhale 2 puffs into the lungs every 6 (six) hours as needed for wheezing or shortness of breath.   amiodarone  200 MG tablet Commonly known as: PACERONE  Take 1 tablet (200 mg total) by mouth daily.   aspirin  EC 81 MG tablet Take 81 mg by mouth in the morning. Swallow whole.   Biktarvy  50-200-25 MG Tabs tablet Generic drug: bictegravir-emtricitabine -tenofovir  AF Take 1 tablet by mouth daily. What changed: when to take this   finasteride  5 MG tablet Commonly known as: PROSCAR  TAKE 1 TABLET(5 MG) BY MOUTH DAILY What changed: See the new instructions.   LORazepam  0.5 MG tablet Commonly known as: ATIVAN  Take 1 tablet (0.5 mg total) by mouth every 8 (eight) hours as needed for anxiety. What changed:  when to take this reasons to take this   Melatonin 10 MG Tabs Take 10 mg by mouth at bedtime.   mexiletine 250 MG capsule Commonly known as: MEXITIL  Take 1 capsule (250 mg total) by mouth every 12 (twelve) hours.   midodrine  10 MG tablet Commonly known as: PROAMATINE  Take 3 tablets (30 mg total) by mouth 3 (three) times daily with  meals. What changed:  medication strength how much to take when to take this   morphine  10 MG/5ML solution Take 1.3 mLs (2.6 mg total) by mouth every 6 (six) hours as needed for severe pain (pain score 7-10).   ondansetron  4 MG tablet Commonly known as: Zofran  Take 1 tablet (4 mg total) by mouth every 8 (eight) hours as needed for nausea or vomiting.   polyethylene glycol 17 g packet Commonly known as: MIRALAX  / GLYCOLAX  Take 17 g by mouth daily.   potassium chloride  20 MEQ packet Commonly known as: KLOR-CON  Take 20 mEq by mouth daily. Tale with Torsemide  Start taking on: Apr 29, 2024   senna 8.6 MG Tabs tablet Commonly known as: SENOKOT Take 2 tablets (17.2 mg total) by mouth daily as needed for mild constipation.   Symbicort 80-4.5 MCG/ACT inhaler Generic drug: budesonide-formoterol  Inhale 2 puffs into the lungs in the morning and at bedtime.   tamsulosin  0.4 MG Caps capsule Commonly known as: FLOMAX  Take 2 capsules (0.8 mg total) by mouth daily. Start taking on: Apr 29, 2024 What changed:  how much to take how to take this when to take this additional instructions  torsemide  20 MG tablet Commonly known as: DEMADEX  Take 1 tablet (20 mg total) by mouth daily. Start taking on: Apr 29, 2024 What changed:  how much to take additional instructions   traZODone  150 MG tablet Commonly known as: DESYREL  Take 150 mg by mouth at bedtime as needed for sleep.   valACYclovir  500 MG tablet Commonly known as: VALTREX  Take 1 tablet (500 mg total) by mouth daily. What changed: when to take this        Discharge Exam: Filed Weights   04/19/24 1503 04/19/24 1813  Weight: 69.4 kg 69.2 kg   General-appears in no acute distress Heart-S1-S2, regular, no murmur auscultated Lungs-clear to auscultation bilaterally, no wheezing or crackles auscultated Abdomen-soft, nontender, no organomegaly Extremities-no edema in the lower extremities Neuro-alert, oriented x3, no focal  deficit noted  Condition at discharge: good  The results of significant diagnostics from this hospitalization (including imaging, microbiology, ancillary and laboratory) are listed below for reference.   Imaging Studies: DG Chest 2 View Result Date: 04/19/2024 CLINICAL DATA:  Chest pain. EXAM: CHEST - 2 VIEW COMPARISON:  Chest radiograph dated 03/26/2024. FINDINGS: No focal consolidation, pleural effusion, pneumothorax. Borderline cardiomegaly. Left pectoral pacemaker device. No acute osseous pathology. IMPRESSION: No active cardiopulmonary disease. Electronically Signed   By: Angus Bark M.D.   On: 04/19/2024 15:53    Microbiology: Results for orders placed or performed in visit on 04/14/24  C. trachomatis/N. gonorrhoeae RNA     Status: None   Collection Time: 04/14/24  2:36 AM   Specimen: Urine  Result Value Ref Range Status   C. trachomatis RNA, TMA NOT DETECTED NOT DETECTED Final   N. gonorrhoeae RNA, TMA NOT DETECTED NOT DETECTED Final    Comment: The analytical performance characteristics of this assay, when used to test SurePath(TM) specimens have been determined by [employer]. The modifications have not been cleared or approved by the FDA. This assay has been validated pursuant to the CLIA regulations and is used for clinical purposes. . For additional information, please refer to https://education.questdiagnostics.com/faq/FAQ154 (This link is being provided for information/ educational purposes only.) .     Labs: CBC: No results for input(s): "WBC", "NEUTROABS", "HGB", "HCT", "MCV", "PLT" in the last 168 hours. Basic Metabolic Panel: Recent Labs  Lab 04/22/24 0449 04/23/24 0420 04/25/24 1104 04/27/24 1209 04/28/24 0352  NA 138 135 137 139 137  K 3.1* 3.7 3.7 2.9* 3.5  CL 104 102 101 103 103  CO2 25 24 26 22 25   GLUCOSE 96 100* 106* 137* 95  BUN 15 16 14 14 17   CREATININE 1.45* 1.26* 1.21 1.30* 1.34*  CALCIUM  8.4* 8.4* 8.6* 8.4* 8.4*  MG 1.7 2.1   --   --   --    Liver Function Tests: No results for input(s): "AST", "ALT", "ALKPHOS", "BILITOT", "PROT", "ALBUMIN " in the last 168 hours. CBG: No results for input(s): "GLUCAP" in the last 168 hours.  Discharge time spent: greater than 30 minutes.  Signed: Ozell Blunt, MD [facility] 04/28/2024

## 2024-04-28 NOTE — TOC Transition Note (Addendum)
 Transition of Care [facility]) - Discharge Note   Patient Details  Name: Ryan Long MRN: [id number] Date of Birth: Jun 05, 1961  Transition of Care [facility]) CM/SW Contact:  Gertha Ku, LCSW Phone Number: 04/28/2024, 10:03 AM   Clinical Narrative:    Pt to d/c to [facility] with Hospice services through [facility]. RN to call report to [phone]. Facility is requesting transport be arranged for after 3pm. PTAR called,  TOC sign off.   ADDEN 12:00pm Pt will be transported to the facility via [facility] due to having personal items and a rollator. CSW contacted [facility] to inquire whether they would provide oxygen  for transport. [facility] reported that if the pt is discharging to a facility, it is the responsibility of that facility to arrange oxygen  for transport.   1:41pm  Received a message from [place] at [facility] stating that the pt cannot be admitted and the facility has rescinded their bed offer. TOC leadership was notified.     Final next level of care: Long Term Nursing Home Barriers to Discharge: Barriers Resolved   Patient Goals and CMS Choice Patient states their goals for this hospitalization and ongoing recovery are:: LTC facility          Discharge Placement                Patient to be transferred to facility by: EMS   Patient and family notified of of transfer: 04/28/24  Discharge Plan and Services Additional resources added to the After Visit Summary for                                       Social Drivers of Health (SDOH) Interventions SDOH Screenings   Food Insecurity: No Food Insecurity (04/19/2024)  Housing: Low Risk  (04/19/2024)  Transportation Needs: No Transportation Needs (04/19/2024)  Utilities: Not At Risk (04/19/2024)  Alcohol  Screen: Low Risk  (07/17/2023)  Depression (PHQ2-9): Low Risk  (08/01/2023)  Financial Resource Strain: Low Risk  (07/17/2023)  Physical Activity: Insufficiently Active (07/17/2023)  Social  Connections: Socially Isolated (04/19/2024)  Stress: Stress Concern Present (07/17/2023)  Tobacco Use: High Risk (04/19/2024)  Health Literacy: Adequate Health Literacy (07/18/2023)     Readmission Risk Interventions    03/19/2024    3:54 PM  Readmission Risk Prevention Plan  Transportation Screening Complete  PCP or Specialist Appt within 3-5 Days Complete  HRI or Home Care Consult Complete  Medication Review (RN Care Manager) Complete

## 2024-04-28 NOTE — Plan of Care (Incomplete)
  Problem: Nutrition: Goal: Adequate nutrition will be maintained Outcome: Progressing   Problem: Coping: Goal: Level of anxiety will decrease Outcome: Progressing   Problem: Pain Managment: Goal: General experience of comfort will improve and/or be controlled Outcome: Progressing   Problem: Education: Goal: Knowledge of General Education information will improve Description: Including pain rating scale, medication(s)/side effects and non-pharmacologic comfort measures Outcome: Adequate for Discharge   Problem: Health Behavior/Discharge Planning: Goal: Ability to manage health-related needs will improve Outcome: Adequate for Discharge   Problem: Clinical Measurements: Goal: Ability to maintain clinical measurements within normal limits will improve Outcome: Adequate for Discharge Goal: Will remain free from infection Outcome: Adequate for Discharge Goal: Diagnostic test results will improve Outcome: Adequate for Discharge Goal: Respiratory complications will improve Outcome: Adequate for Discharge Goal: Cardiovascular complication will be avoided Outcome: Adequate for Discharge   Problem: Activity: Goal: Risk for activity intolerance will decrease Outcome: Adequate for Discharge   Problem: Elimination: Goal: Will not experience complications related to bowel motility Outcome: Adequate for Discharge Goal: Will not experience complications related to urinary retention Outcome: Adequate for Discharge   Problem: Safety: Goal: Ability to remain free from injury will improve Outcome: Adequate for Discharge   Problem: Skin Integrity: Goal: Risk for impaired skin integrity will decrease Outcome: Adequate for Discharge

## 2024-04-28 NOTE — Progress Notes (Signed)
 [facility]  PROGRESS NOTE  Gurminder Najafi HQI:[id number] DOB: 1961/01/10 DOA: 04/19/2024 PCP: Tretha Fu, MD   Brief HPI:    63 y.o. male with past medical history significant for chronic combined/diastolic CHF/end-stage cardiomyopathy status post AICD (LVEF 20-25% with G1DD ), NSVT, HIV (CD4 count 429, undetectable VL 04/14/2024) , hypertension, hyperlipidemia, COPD, anxiety, depression, bipolar disorder, substance abuse, BPH, CKD stage IIIa (Cr 1.5 - 1.6) currently on home hospice who presented to [facility] ED on 04/19/2024 with complaint of chest pain, dizziness, headache, shortness of breath.    Assessment/Plan:   End-stage systolic heart failure, chronic  Patient presented with symptoms of intermittent lightheadedness chest discomfort and shortness of breath consistent with end-stage congestive heart failure. Patient is already on hospice for his advanced disease with a [facility]; prognosis is poor and ICD was shut off earlier this year.  Patient euvolemic on physical exam with total loss of 45 pounds over the last year. -- Palliative care following, appreciate assistance -- Unlikely candidate for residential hospice at this point but would benefit from discharge with hospice -Patient continues to have episodes of nonsustained V. tach, currently requiring oxygen  2 L/min, has been having episodes of hypotension, tachycardia -- Midodrine  30 mg p.o. 3 times daily -- Morphine  2.5 mg p.o. every 2 hours as needed severe pain -Continue torsemide  20 mg daily   Chest pain Chest pain thought to be secondary to progressive symptoms of end-stage heart failure.  Troponins within normal limits. --Supportive care   Hypokalemia Potassium was 2.9 today -Replace potassium and follow BMP in am   HIV (human immunodeficiency virus infection)  -- Continue Biktarvy  -- Continue Valtrex  due to history of recurrent HSV -- Outpatient ID follow-up with infectious disease as  needed   Paroxysmal atrial fibrillation  -- Continue mexiletine 250 mg p.o. every 12 hours and amiodarone  200 mg p.o. daily -- not on anticoagulation   Hx Ventricular tachycardia  History of admission for VT storm earlier in the year; ICD now turned off. -Patient having episodes of nonsustained V. tach -- Continue mexiletine 250 mg p.o. every 12 hours and amiodarone  200 mg p.o. daily -Serum magnesium  1.7, replaced, repeat magnesium  2.1   COPD (chronic obstructive pulmonary disease); not in acute exacerbation -- Dulera  2 puffs twice daily -- Albuterol  neb every 6 hours.  Wheezing/shortness of breath   Chronic kidney disease, stage 3a  Baseline creatinine 1.5-1.6. -- Cr 1.40>1.23>1.45> 1.21 -- Avoid nephrotoxins, renally dose all medications   BPH (benign prostatic hyperplasia) -- Finasteride  5 mg p.o. daily -Patient takes tamsulosin  0.8 mg p.o. twice daily.   -Will change it to 0.8 mg p.o. daily    Weakness/debility/deconditioning: -- PT/OT following; TOC consulted      Medications     albuterol   2.5 mg Nebulization BID   amiodarone   200 mg Oral Daily   aspirin  EC  81 mg Oral q AM   bictegravir-emtricitabine -tenofovir  AF  1 tablet Oral Daily   enoxaparin  (LOVENOX ) injection  40 mg Subcutaneous Q24H   feeding supplement  237 mL Oral BID BM   finasteride   5 mg Oral Daily   melatonin  10 mg Oral QHS   mexiletine  250 mg Oral Q12H   midodrine   30 mg Oral TID WC   mometasone -formoterol   2 puff Inhalation BID   senna  2 tablet Oral Daily   sodium chloride  flush  3 mL Intravenous Q12H   tamsulosin   0.8 mg Oral Daily   torsemide   20 mg Oral  Daily   valACYclovir   500 mg Oral Daily     Data Reviewed:   CBG:  No results for input(s): "GLUCAP" in the last 168 hours.  SpO2: 100 % O2 Flow Rate (L/min): 2 L/min    Vitals:   04/27/24 2042 04/27/24 2200 04/27/24 2219 04/28/24 0606  BP: (!) 88/68 100/81  98/76  Pulse: 88 95  98  Resp: 17   18  Temp: 97.9 F (36.6 C)    97.8 F (36.6 C)  TempSrc: Oral   Oral  SpO2: 100%  95% 100%  Weight:      Height:          Data Reviewed:  Basic Metabolic Panel: Recent Labs  Lab 04/22/24 0449 04/23/24 0420 04/25/24 1104 04/27/24 1209 04/28/24 0352  NA 138 135 137 139 137  K 3.1* 3.7 3.7 2.9* 3.5  CL 104 102 101 103 103  CO2 25 24 26 22 25   GLUCOSE 96 100* 106* 137* 95  BUN 15 16 14 14 17   CREATININE 1.45* 1.26* 1.21 1.30* 1.34*  CALCIUM  8.4* 8.4* 8.6* 8.4* 8.4*  MG 1.7 2.1  --   --   --     CBC: No results for input(s): "WBC", "NEUTROABS", "HGB", "HCT", "MCV", "PLT" in the last 168 hours.   LFT No results for input(s): "AST", "ALT", "ALKPHOS", "BILITOT", "PROT", "ALBUMIN " in the last 168 hours.    Antibiotics: Anti-infectives (From admission, onward)    Start     Dose/Rate Route Frequency Ordered Stop   04/20/24 1000  bictegravir-emtricitabine -tenofovir  AF (BIKTARVY ) 50-200-25 MG per tablet 1 tablet        1 tablet Oral Daily 04/19/24 2243     04/20/24 1000  valACYclovir  (VALTREX ) tablet 500 mg        500 mg Oral Daily 04/19/24 2243          DVT prophylaxis: Lovenox   Code Status: DNR  Family Communication: No family at bedside   CONSULTS    Subjective   Denies shortness of breath  Objective    Physical Examination:  General-appears in no acute distress Heart-S1-S2, regular, no murmur auscultated Lungs-clear to auscultation bilaterally, no wheezing or crackles auscultated Abdomen-soft, nontender, no organomegaly Extremities-TRACE  edema in the lower extremities Neuro-alert, oriented x3, no focal deficit noted  Status is: Inpatient:             Kristen Bushway S Starlett Pehrson   [facility] If 7PM-7AM, please contact night-coverage at [email or web address], Office  [phone]   04/28/2024, 8:18 AM  LOS: 5 days

## 2024-04-29 DIAGNOSIS — I959 Hypotension, unspecified: Secondary | ICD-10-CM | POA: Diagnosis not present

## 2024-04-29 DIAGNOSIS — E876 Hypokalemia: Secondary | ICD-10-CM | POA: Diagnosis not present

## 2024-04-29 DIAGNOSIS — I5022 Chronic systolic (congestive) heart failure: Secondary | ICD-10-CM | POA: Diagnosis not present

## 2024-04-29 DIAGNOSIS — I480 Paroxysmal atrial fibrillation: Secondary | ICD-10-CM | POA: Diagnosis not present

## 2024-04-29 MED ORDER — POTASSIUM CHLORIDE 20 MEQ PO PACK
20.0000 meq | PACK | Freq: Every day | ORAL | Status: DC
  Administered 2024-04-29 – 2024-05-08 (×10): 20 meq via ORAL
  Filled 2024-04-29 (×10): qty 1

## 2024-04-29 NOTE — TOC Progression Note (Signed)
 Transition of Care [facility]) - Progression Note    Patient Details  Name: Ryan Long MRN: [id number] Date of Birth: June 02, 1961  Transition of [facility]) CM/SW Contact  Gertha Ku, LCSW Phone Number: 04/29/2024, 9:02 AM  Clinical Narrative:    CSW spoke with the pt and provided him with an update regarding [facility] rescinding their bed offer. CSW explained that the pt is up for d/c and may have to return home and continue to look for  long-term care placement in the community.  CSW followed up with [facility], who had been considering the pt; however, they reported that they do not have any available male LTC beds. CSW also followed up with [facility]. They stated that they are still reviewing the pt for placement and are awaiting a response from the facility director. TOC to follow.      Expected Discharge Plan: Home w Hospice Care   Expected Discharge Plan and Services       Living arrangements for the past 2 months: Single Family Home Expected Discharge Date: 04/28/24                                     Social Determinants of Health (SDOH) Interventions SDOH Screenings   Food Insecurity: No Food Insecurity (04/19/2024)  Housing: Low Risk  (04/19/2024)  Transportation Needs: No Transportation Needs (04/19/2024)  Utilities: Not At Risk (04/19/2024)  Alcohol  Screen: Low Risk  (07/17/2023)  Depression (PHQ2-9): Low Risk  (08/01/2023)  Financial Resource Strain: Low Risk  (07/17/2023)  Physical Activity: Insufficiently Active (07/17/2023)  Social Connections: Socially Isolated (04/19/2024)  Stress: Stress Concern Present (07/17/2023)  Tobacco Use: High Risk (04/19/2024)  Health Literacy: Adequate Health Literacy (07/18/2023)    Readmission Risk Interventions    03/19/2024    3:54 PM  Readmission Risk Prevention Plan  Transportation Screening Complete  PCP or Specialist Appt within 3-5 Days Complete  HRI or Home Care Consult Complete  Medication Review  (RN Care Manager) Complete

## 2024-04-29 NOTE — Plan of Care (Signed)

## 2024-04-29 NOTE — Progress Notes (Signed)
 The 10-year ASCVD risk score (Arnett DK, et al., 2019) is: 14.9%   Values used to calculate the score:     Age: 63 years     Sex: Male     Is Non-Hispanic African American: Yes     Diabetic: No     Tobacco smoker: Yes     Systolic Blood Pressure: 104 mmHg     Is BP treated: Yes     HDL Cholesterol: 71 mg/dL     Total Cholesterol: 191 mg/dL  Arlon Bergamo, BSN, RN

## 2024-04-29 NOTE — Plan of Care (Incomplete)
  Problem: Clinical Measurements: Goal: Respiratory complications will improve Outcome: Progressing Goal: Cardiovascular complication will be avoided Outcome: Progressing   Problem: Nutrition: Goal: Adequate nutrition will be maintained Outcome: Progressing   Problem: Coping: Goal: Level of anxiety will decrease Outcome: Progressing   Problem: Pain Managment: Goal: General experience of comfort will improve and/or be controlled Outcome: Progressing   Problem: Education: Goal: Knowledge of General Education information will improve Description: Including pain rating scale, medication(s)/side effects and non-pharmacologic comfort measures Outcome: Adequate for Discharge   Problem: Health Behavior/Discharge Planning: Goal: Ability to manage health-related needs will improve Outcome: Adequate for Discharge   Problem: Clinical Measurements: Goal: Ability to maintain clinical measurements within normal limits will improve Outcome: Adequate for Discharge Goal: Will remain free from infection Outcome: Adequate for Discharge Goal: Diagnostic test results will improve Outcome: Adequate for Discharge   Problem: Activity: Goal: Risk for activity intolerance will decrease Outcome: Adequate for Discharge   Problem: Safety: Goal: Ability to remain free from injury will improve Outcome: Adequate for Discharge   Problem: Skin Integrity: Goal: Risk for impaired skin integrity will decrease Outcome: Adequate for Discharge

## 2024-04-29 NOTE — Progress Notes (Signed)
 [facility]  PROGRESS NOTE  Ryan Long AVW:[id number] DOB: August 07, 1961 DOA: 04/19/2024 PCP: Tretha Fu, MD   Brief HPI:    63 y.o. male with past medical history significant for chronic combined/diastolic CHF/end-stage cardiomyopathy status post AICD (LVEF 20-25% with G1DD ), NSVT, HIV (CD4 count 429, undetectable VL 04/14/2024) , hypertension, hyperlipidemia, COPD, anxiety, depression, bipolar disorder, substance abuse, BPH, CKD stage IIIa (Cr 1.5 - 1.6) currently on home hospice who presented to [facility] ED on 04/19/2024 with complaint of chest pain, dizziness, headache, shortness of breath.    Assessment/Plan:   End-stage systolic heart failure, chronic  Patient presented with symptoms of intermittent lightheadedness chest discomfort and shortness of breath consistent with end-stage congestive heart failure. Patient is already on hospice for his advanced disease with a [facility]; prognosis is poor and ICD was shut off earlier this year.  Patient euvolemic on physical exam with total loss of 45 pounds over the last year. -- Palliative care following, appreciate assistance -- Unlikely candidate for residential hospice at this point but would benefit from discharge with hospice -Patient continues to have episodes of nonsustained V. tach, currently requiring oxygen  2 L/min, has been having episodes of hypotension, tachycardia -- Midodrine  30 mg p.o. 3 times daily -- Morphine  2.5 mg p.o. every 2 hours as needed severe pain -Continue torsemide  20 mg daily   Chest pain Chest pain thought to be secondary to progressive symptoms of end-stage heart failure.  Troponins within normal limits. --Supportive care   Hypokalemia Replete   HIV (human immunodeficiency virus infection)  -- Continue Biktarvy  -- Continue Valtrex  due to history of recurrent HSV -- Outpatient ID follow-up with infectious disease as needed   Paroxysmal atrial fibrillation  -- Continue  mexiletine 250 mg p.o. every 12 hours and amiodarone  200 mg p.o. daily -- not on anticoagulation   Hx Ventricular tachycardia  History of admission for VT storm earlier in the year; ICD now turned off. -Patient having episodes of nonsustained V. tach -- Continue mexiletine 250 mg p.o. every 12 hours and amiodarone  200 mg p.o. daily -Serum magnesium  1.7, replaced, repeat magnesium  2.1   COPD (chronic obstructive pulmonary disease); not in acute exacerbation -- Dulera  2 puffs twice daily -- Albuterol  neb every 6 hours.  Wheezing/shortness of breath   Chronic kidney disease, stage 3a  Baseline creatinine 1.5-1.6. -- Cr 1.40>1.23>1.45> 1.21>1.34 -- Avoid nephrotoxins, renally dose all medications   BPH (benign prostatic hyperplasia) -- Finasteride  5 mg p.o. daily -Patient takes tamsulosin  0.8 mg p.o. twice daily.   -Will change it to 0.8 mg p.o. daily    Weakness/debility/deconditioning: -- PT/OT following; TOC consulted      Medications     albuterol   2.5 mg Nebulization BID   amiodarone   200 mg Oral Daily   aspirin  EC  81 mg Oral q AM   bictegravir-emtricitabine -tenofovir  AF  1 tablet Oral Daily   enoxaparin  (LOVENOX ) injection  40 mg Subcutaneous Q24H   feeding supplement  237 mL Oral BID BM   finasteride   5 mg Oral Daily   melatonin  10 mg Oral QHS   mexiletine  250 mg Oral Q12H   midodrine   30 mg Oral TID WC   mometasone -formoterol   2 puff Inhalation BID   senna  2 tablet Oral Daily   sodium chloride  flush  3 mL Intravenous Q12H   tamsulosin   0.8 mg Oral Daily   torsemide   20 mg Oral Daily   valACYclovir   500 mg Oral Daily  Data Reviewed:   CBG:  No results for input(Ryan): "GLUCAP" in the last 168 hours.  SpO2: 97 % O2 Flow Rate (L/min): (Ryan) 2 L/min (pp- 2 lpm dep)    Vitals:   04/29/24 0840 04/29/24 0918 04/29/24 1130 04/29/24 1233  BP: 104/87  90/64 (!) 87/64  Pulse: 90  90 93  Resp: 17  (!) 24 18  Temp:   97.8 F (36.6 C) 98.1 F (36.7 C)   TempSrc:   Oral Oral  SpO2:  98% 99% 97%  Weight:      Height:          Data Reviewed:  Basic Metabolic Panel: Recent Labs  Lab 04/23/24 0420 04/25/24 1104 04/27/24 1209 04/28/24 0352  NA 135 137 139 137  K 3.7 3.7 2.9* 3.5  CL 102 101 103 103  CO2 24 26 22 25   GLUCOSE 100* 106* 137* 95  BUN 16 14 14 17   CREATININE 1.26* 1.21 1.30* 1.34*  CALCIUM  8.4* 8.6* 8.4* 8.4*  MG 2.1  --   --   --     CBC: No results for input(Ryan): "WBC", "NEUTROABS", "HGB", "HCT", "MCV", "PLT" in the last 168 hours.   LFT No results for input(Ryan): "AST", "ALT", "ALKPHOS", "BILITOT", "PROT", "ALBUMIN " in the last 168 hours.    Antibiotics: Anti-infectives (From admission, onward)    Start     Dose/Rate Route Frequency Ordered Stop   04/20/24 1000  bictegravir-emtricitabine -tenofovir  AF (BIKTARVY ) 50-200-25 MG per tablet 1 tablet        1 tablet Oral Daily 04/19/24 2243     04/20/24 1000  valACYclovir  (VALTREX ) tablet 500 mg        500 mg Oral Daily 04/19/24 2243          DVT prophylaxis: Lovenox   Code Status: DNR  Family Communication: No family at bedside   CONSULTS    Subjective   Denies shortness of breath  Objective    Physical Examination:  General-appears in no acute distress Heart-S1-S2, regular, no murmur auscultated Lungs-clear to auscultation bilaterally, no wheezing or crackles auscultated Abdomen-soft, nontender, no organomegaly Extremities-trace  edema in the lower extremities Neuro-alert, oriented x3, no focal deficit noted  Status is: Inpatient:             Ryan Long Ryan Long   [facility] If 7PM-7AM, please contact night-coverage at [email or web address], Office  [phone]   04/29/2024, 4:55 PM  LOS: 6 days

## 2024-04-29 NOTE — Progress Notes (Deleted)
 Subjective:  Chief complaint: follow-up for HIV disease on medications   Patient ID: Ryan Long, male    DOB: June 19, 1961, 63 y.o.   MRN: [id number]  HPI   Past Medical History:  Diagnosis Date   Active smoker    AICD (automatic cardioverter/defibrillator) present    Alcohol  abuse    Allergy July 2023   Anxiety    AR (allergic rhinitis)    Bipolar 1 disorder (HCC)    Cervical lymphadenitis 04/20/2021   CHF (congestive heart failure) (HCC)    Chronic bronchitis (HCC)    Chronic systolic heart failure (HCC)    Controlled substance agreement signed 10/22/2018   COPD (chronic obstructive pulmonary disease) (HCC)    COPD (chronic obstructive pulmonary disease) (HCC) 10/04/2015   Cough 12/31/2018   Crack cocaine use    Depression    Genital herpes    HIV (human immunodeficiency virus infection) (HCC) dx'd 08/1993   HLD (hyperlipidemia)    Hypertension    NICM (nonischemic cardiomyopathy) (HCC)    Echocardiogram 06/28/11: EF 30-35%, mild MR, mild LAE;  No CAD by coronary CT angiogram 3/12 [facility]   NSVT (nonsustained ventricular tachycardia) (HCC)    PTSD (post-traumatic stress disorder)     Past Surgical History:  Procedure Laterality Date   CARDIAC DEFIBRILLATOR PLACEMENT  01/08/2018   ICD IMPLANT N/A 01/08/2018   Procedure: ICD IMPLANT;  Surgeon: Lei Pump, MD;  Location: [facility] INVASIVE CV LAB;  Service: Cardiovascular;  Laterality: N/A;   RIGHT/LEFT HEART CATH AND CORONARY ANGIOGRAPHY N/A 01/03/2024   Procedure: RIGHT/LEFT HEART CATH AND CORONARY ANGIOGRAPHY;  Surgeon: Mardell Shade, MD;  Location: [facility] INVASIVE CV LAB;  Service: Cardiovascular;  Laterality: N/A;    Family History  Problem Relation Age of Onset   Glaucoma Mother    Mental illness Mother    Vision loss Mother    Hypertension Father    CAD Father    Mental illness Father    Alcohol  abuse Father    Drug abuse Father    Heart attack Father 27   Early death  Father    Heart disease Father    Alcohol  abuse Brother    Drug abuse Brother    Drug abuse Brother       Social History   Socioeconomic History   Marital status: Single    Spouse name: Not on file   Number of children: Not on file   Years of education: Not on file   Highest education level: GED or equivalent  Occupational History   Not on file  Tobacco Use   Smoking status: Every Day    Current packs/day: 0.75    Average packs/day: 0.8 packs/day for 42.0 years (31.5 ttl pk-yrs)    Types: Cigarettes   Smokeless tobacco: Never   Tobacco comments:    hoping welbutrin will help  Vaping Use   Vaping status: Never Used  Substance and Sexual Activity   Alcohol  use: Not Currently    Alcohol /week: 6.0 standard drinks of alcohol     Comment: 01/08/2018 "stopped 06/2017"   Drug use: Not Currently    Types: "Crack" cocaine    Comment: 01/08/2018 "stopped 06/2017"   Sexual activity: Yes    Birth control/protection: Condom    Comment: pt. declined condoms  Other Topics Concern   Not on file  Social History Narrative   Not on file   Social Drivers of Health   Financial Resource Strain: Low Risk  (07/17/2023)  Overall Financial Resource Strain (CARDIA)    Difficulty of Paying Living Expenses: Not hard at all  Food Insecurity: No Food Insecurity (04/19/2024)   Hunger Vital Sign    Worried About Running Out of Food in the Last Year: Never true    Ran Out of Food in the Last Year: Never true  Transportation Needs: No Transportation Needs (04/19/2024)   PRAPARE - Administrator, Civil Service (Medical): No    Lack of Transportation (Non-Medical): No  Physical Activity: Insufficiently Active (07/17/2023)   Exercise Vital Sign    Days of Exercise per Week: 3 days    Minutes of Exercise per Session: 20 min  Stress: Stress Concern Present (07/17/2023)   [employer] of Occupational Health - Occupational Stress Questionnaire    Feeling of Stress : To some extent  Social  Connections: Socially Isolated (04/19/2024)   Social Connection and Isolation Panel [NHANES]    Frequency of Communication with Friends and Family: Once a week    Frequency of Social Gatherings with Friends and Family: Never    Attends Religious Services: More than 4 times per year    Active Member of [employer] or Organizations: No    Attends Banker Meetings: Never    Marital Status: Never married    No Known Allergies  No current facility-administered medications for this visit.  Current Outpatient Medications:    LORazepam  (ATIVAN ) 0.5 MG tablet, Take 1 tablet (0.5 mg total) by mouth every 8 (eight) hours as needed for anxiety., Disp: 5 tablet, Rfl: 0   midodrine  (PROAMATINE ) 10 MG tablet, Take 3 tablets (30 mg total) by mouth 3 (three) times daily with meals., Disp: , Rfl:    morphine  10 MG/5ML solution, Take 1.3 mLs (2.6 mg total) by mouth every 6 (six) hours as needed for severe pain (pain score 7-10)., Disp: 10 mL, Rfl: 0   potassium chloride  (KLOR-CON ) 20 MEQ packet, Take 20 mEq by mouth daily. Tale with Torsemide , Disp: , Rfl:    senna (SENOKOT) 8.6 MG TABS tablet, Take 2 tablets (17.2 mg total) by mouth daily as needed for mild constipation., Disp: , Rfl:    tamsulosin  (FLOMAX ) 0.4 MG CAPS capsule, Take 2 capsules (0.8 mg total) by mouth daily., Disp: , Rfl:    torsemide  (DEMADEX ) 20 MG tablet, Take 1 tablet (20 mg total) by mouth daily., Disp: , Rfl:   Facility-Administered Medications Ordered in Other Visits:    acetaminophen  (TYLENOL ) tablet 650 mg, 650 mg, Oral, Q6H PRN, 650 mg at 04/29/24 1507 **OR** acetaminophen  (TYLENOL ) suppository 650 mg, 650 mg, Rectal, Q6H PRN, Felipe Horton, Rondell A, MD   albuterol  (PROVENTIL ) (2.5 MG/3ML) 0.083% nebulizer solution 2.5 mg, 2.5 mg, Nebulization, Q6H PRN, Felipe Horton, Rondell A, MD, 2.5 mg at 04/24/24 2014   albuterol  (PROVENTIL ) (2.5 MG/3ML) 0.083% nebulizer solution 2.5 mg, 2.5 mg, Nebulization, BID, Alfonse Angle, Gagan S, MD, 2.5 mg at 04/29/24  1610   amiodarone  (PACERONE ) tablet 200 mg, 200 mg, Oral, Daily, Smith, Rondell A, MD, 200 mg at 04/29/24 9604   aspirin  EC tablet 81 mg, 81 mg, Oral, q AM, Smith, Rondell A, MD, 81 mg at 04/29/24 0615   bictegravir-emtricitabine -tenofovir  AF (BIKTARVY ) 50-200-25 MG per tablet 1 tablet, 1 tablet, Oral, Daily, Felipe Horton, Rondell A, MD, 1 tablet at 04/29/24 0811   docusate sodium  (COLACE) capsule 100 mg, 100 mg, Oral, BID PRN, Shalhoub, George J, MD, 100 mg at 04/27/24 1214   enoxaparin  (LOVENOX ) injection 40 mg, 40 mg, Subcutaneous, Q24H,  Manny Sees A, MD, 40 mg at 04/28/24 2148   feeding supplement (ENSURE ENLIVE / ENSURE PLUS) liquid 237 mL, 237 mL, Oral, BID BM, Smith, Rondell A, MD, 237 mL at 04/29/24 1610   finasteride  (PROSCAR ) tablet 5 mg, 5 mg, Oral, Daily, Felipe Horton, Rondell A, MD, 5 mg at 04/29/24 9604   lip balm (CARMEX) ointment, , Topical, PRN, [place], Rema Care, DO, Given at 04/22/24 1704   LORazepam  (ATIVAN ) tablet 0.5 mg, 0.5 mg, Oral, Q4H PRN, [place], Rema Care, DO, 0.5 mg at 04/29/24 1656   melatonin tablet 10 mg, 10 mg, Oral, QHS, Smith, Rondell A, MD, 10 mg at 04/28/24 2147   mexiletine (MEXITIL ) capsule 250 mg, 250 mg, Oral, Q12H, Merdis Stalling, MD, 250 mg at 04/29/24 5409   midodrine  (PROAMATINE ) tablet 30 mg, 30 mg, Oral, TID WC, [place], Eric J, DO, 30 mg at 04/29/24 1656   mometasone -formoterol  (DULERA ) 100-5 MCG/ACT inhaler 2 puff, 2 puff, Inhalation, BID, Felipe Horton, Rondell A, MD, 2 puff at 04/29/24 0918   morphine  10 MG/5ML solution 2.5 mg, 2.5 mg, Oral, Q2H PRN, Shalhoub, George J, MD, 2.5 mg at 04/29/24 1656   ondansetron  (ZOFRAN ) injection 4 mg, 4 mg, Intravenous, Q6H PRN, Chavez, Abigail, NP, 4 mg at 04/28/24 1339   Oral care mouth rinse, 15 mL, Mouth Rinse, PRN, [place], Eric J, DO   polyethylene glycol (MIRALAX  / GLYCOLAX ) packet 17 g, 17 g, Oral, Daily PRN, Ozell Blunt, MD, 17 g at 04/29/24 0810   potassium chloride  (KLOR-CON ) packet 20 mEq, 20 mEq, Oral, Daily, Alfonse Angle,  Benuel Brazier, MD   senna (SENOKOT) tablet 17.2 mg, 2 tablet, Oral, Daily, Alfonse Angle, Gagan S, MD, 17.2 mg at 04/29/24 8119   sodium chloride  flush (NS) 0.9 % injection 3 mL, 3 mL, Intravenous, Q12H, Smith, Rondell A, MD, 3 mL at 04/29/24 1478   tamsulosin  (FLOMAX ) capsule 0.8 mg, 0.8 mg, Oral, Daily, Alfonse Angle, Gagan S, MD, 0.8 mg at 04/29/24 2956   torsemide  (DEMADEX ) tablet 20 mg, 20 mg, Oral, Daily, [place], Rema Care, DO, 20 mg at 04/29/24 2130   traZODone  (DESYREL ) tablet 150 mg, 150 mg, Oral, QHS PRN, Shalhoub, George J, MD, 150 mg at 04/28/24 2143   trimethobenzamide  (TIGAN ) injection 200 mg, 200 mg, Intramuscular, Q6H PRN, Felipe Horton, Rondell A, MD   valACYclovir  (VALTREX ) tablet 500 mg, 500 mg, Oral, Daily, Felipe Horton, Rondell A, MD, 500 mg at 04/29/24 8657   Review of Systems     Objective:   Physical Exam        Assessment & Plan:

## 2024-04-30 ENCOUNTER — Ambulatory Visit: Payer: Self-pay | Admitting: Infectious Disease

## 2024-04-30 DIAGNOSIS — E785 Hyperlipidemia, unspecified: Secondary | ICD-10-CM

## 2024-04-30 DIAGNOSIS — J449 Chronic obstructive pulmonary disease, unspecified: Secondary | ICD-10-CM

## 2024-04-30 DIAGNOSIS — B20 Human immunodeficiency virus [HIV] disease: Secondary | ICD-10-CM

## 2024-04-30 DIAGNOSIS — I472 Ventricular tachycardia, unspecified: Secondary | ICD-10-CM

## 2024-04-30 DIAGNOSIS — N401 Enlarged prostate with lower urinary tract symptoms: Secondary | ICD-10-CM

## 2024-04-30 DIAGNOSIS — N1831 Chronic kidney disease, stage 3a: Secondary | ICD-10-CM

## 2024-04-30 DIAGNOSIS — I509 Heart failure, unspecified: Secondary | ICD-10-CM

## 2024-04-30 DIAGNOSIS — I480 Paroxysmal atrial fibrillation: Secondary | ICD-10-CM

## 2024-04-30 DIAGNOSIS — E876 Hypokalemia: Secondary | ICD-10-CM | POA: Diagnosis not present

## 2024-04-30 NOTE — TOC Progression Note (Signed)
 Transition of Care [facility]) - Progression Note    Patient Details  Name: Ryan Long MRN: [id number] Date of Birth: 04/09/1961  Transition of Care [facility]) CM/SW Contact  Gertha Ku, LCSW Phone Number: 04/30/2024, 8:57 AM  Clinical Narrative:     Waiting to hear back from [facility] leadership. TOC to follow.    Expected Discharge Plan and Services       Living arrangements for the past 2 months: Single Family Home Expected Discharge Date: 04/28/24                                     Social Determinants of Health (SDOH) Interventions SDOH Screenings   Food Insecurity: No Food Insecurity (04/19/2024)  Housing: Low Risk  (04/19/2024)  Transportation Needs: No Transportation Needs (04/19/2024)  Utilities: Not At Risk (04/19/2024)  Alcohol  Screen: Low Risk  (07/17/2023)  Depression (PHQ2-9): Low Risk  (08/01/2023)  Financial Resource Strain: Low Risk  (07/17/2023)  Physical Activity: Insufficiently Active (07/17/2023)  Social Connections: Socially Isolated (04/19/2024)  Stress: Stress Concern Present (07/17/2023)  Tobacco Use: High Risk (04/19/2024)  Health Literacy: Adequate Health Literacy (07/18/2023)    Readmission Risk Interventions    03/19/2024    3:54 PM  Readmission Risk Prevention Plan  Transportation Screening Complete  PCP or Specialist Appt within 3-5 Days Complete  HRI or Home Care Consult Complete  Medication Review (RN Care Manager) Complete

## 2024-04-30 NOTE — Progress Notes (Signed)
 PROGRESS NOTE    Ryan Long  WUJ:[id number] DOB: [birth year] DOA: 04/19/2024 PCP: Tretha Fu, MD   Brief Narrative:  63 y.o. male with past medical history significant for chronic combined/diastolic CHF/end-stage cardiomyopathy status post AICD (LVEF 20-25% with G1DD ), NSVT, HIV (CD4 count 429, undetectable VL 04/14/2024) , hypertension, hyperlipidemia, COPD, anxiety, depression, bipolar disorder, substance abuse, BPH, CKD stage IIIa (Cr 1.5 - 1.6) currently on home hospice presented with chest pain, shortness of breath, dizziness and headache.  He was admitted for worsening CHF and has had a long hospitalization.  Currently on oral diuretics.  Palliative care evaluated the patient.  Currently awaiting discharge to SNF with hospice.  Assessment & Plan:   End-stage chronic systolic heart failure Goals of care Hypotension -Patient has end-stage chronic systolic heart failure and was at home with hospice.  Prognosis is very poor and ICD was set off earlier this year. - Palliative care evaluated the patient.  Currently awaiting discharge to SNF with hospice.  TOC following - Continue torsemide  and midodrine  - Will DC telemetry  Chest pain - Possibly from above.  Troponins were within normal limits.  No further workup needed.  Continue supportive care and as needed nitroglycerin  and morphine   Hypokalemia - Resolved.  No recent labs.    BPH -Continue finasteride  and tamsulosin   HIV -Continue Biktarvy .  Continue Valtrex  due to history of recurrent HSV.  Outpatient follow-up with ID as needed  COPD -Stable.  Continue current inhaled regimen and nebs  CKD stage IIIa - Creatinine currently stable.  Monitor intermittently  History of ventricular tachycardia - History of admission for VT storm earlier in the ER.  ICD now turned off.  Having episodes of nonsustained V. tach.  Continue mexiletine, amiodarone   Weakness/debility/deconditioning -Patient waiting to be  discharged to SNF with hospice.  TOC following.   DVT prophylaxis: Lovenox  Code Status: DNR Family Communication: None at bedside Disposition Plan: Status is: Inpatient Remains inpatient appropriate because: Of severity of illness  Consultants: Palliative care  Procedures: None  Antimicrobials: None   Subjective: Patient seen and examined at bedside.  Poor historian.  Complains of feet swelling.  Objective: Vitals:   04/30/24 0631 04/30/24 0855 04/30/24 0946 04/30/24 1312  BP: 97/70  98/68 (!) 83/63  Pulse: 90  87 85  Resp: 18     Temp: 97.6 F (36.4 C)   97.8 F (36.6 C)  TempSrc: Oral   Oral  SpO2: 99% 97% 99% 100%  Weight:      Height:       No intake or output data in the 24 hours ending 04/30/24 1337 Filed Weights   04/19/24 1503 04/19/24 1813  Weight: 69.4 kg 69.2 kg    Examination:  General exam: Appears calm and comfortable.  Looks chronically ill and deconditioned.  Slow to respond.  Poor historian.  Flat affect. Respiratory system: Bilateral decreased breath sounds at bases Cardiovascular system: S1 & S2 heard, Rate controlled Gastrointestinal system: Abdomen is nondistended, soft and nontender. Normal bowel sounds heard. Extremities: No cyanosis, clubbing; bilateral lower extremity edema present    Data Reviewed: I have personally reviewed following labs and imaging studies  CBC: No results for input(s): "WBC", "NEUTROABS", "HGB", "HCT", "MCV", "PLT" in the last 168 hours. Basic Metabolic Panel: Recent Labs  Lab 04/25/24 1104 04/27/24 1209 04/28/24 0352  NA 137 139 137  K 3.7 2.9* 3.5  CL 101 103 103  CO2 26 22 25   GLUCOSE 106* 137* 95  BUN 14  14 17  CREATININE 1.21 1.30* 1.34*  CALCIUM  8.6* 8.4* 8.4*   GFR: Estimated Creatinine Clearance: 55.3 mL/min (A) (by C-G formula based on SCr of 1.34 mg/dL (H)). Liver Function Tests: No results for input(s): "AST", "ALT", "ALKPHOS", "BILITOT", "PROT", "ALBUMIN " in the last 168 hours. No  results for input(s): "LIPASE", "AMYLASE" in the last 168 hours. No results for input(s): "AMMONIA" in the last 168 hours. Coagulation Profile: No results for input(s): "INR", "PROTIME" in the last 168 hours. Cardiac Enzymes: No results for input(s): "CKTOTAL", "CKMB", "CKMBINDEX", "TROPONINI" in the last 168 hours. BNP (last 3 results) No results for input(s): "PROBNP" in the last 8760 hours. HbA1C: No results for input(s): "HGBA1C" in the last 72 hours. CBG: No results for input(s): "GLUCAP" in the last 168 hours. Lipid Profile: No results for input(s): "CHOL", "HDL", "LDLCALC", "TRIG", "CHOLHDL", "LDLDIRECT" in the last 72 hours. Thyroid  Function Tests: No results for input(s): "TSH", "T4TOTAL", "FREET4", "T3FREE", "THYROIDAB" in the last 72 hours. Anemia Panel: No results for input(s): "VITAMINB12", "FOLATE", "FERRITIN", "TIBC", "IRON", "RETICCTPCT" in the last 72 hours. Sepsis Labs: No results for input(s): "PROCALCITON", "LATICACIDVEN" in the last 168 hours.  No results found for this or any previous visit (from the past 240 hours).       Radiology Studies: No results found.      Scheduled Meds:  albuterol   2.5 mg Nebulization BID   amiodarone   200 mg Oral Daily   aspirin  EC  81 mg Oral q AM   bictegravir-emtricitabine -tenofovir  AF  1 tablet Oral Daily   enoxaparin  (LOVENOX ) injection  40 mg Subcutaneous Q24H   feeding supplement  237 mL Oral BID BM   finasteride   5 mg Oral Daily   melatonin  10 mg Oral QHS   mexiletine  250 mg Oral Q12H   midodrine   30 mg Oral TID WC   mometasone -formoterol   2 puff Inhalation BID   potassium chloride   20 mEq Oral Daily   senna  2 tablet Oral Daily   sodium chloride  flush  3 mL Intravenous Q12H   tamsulosin   0.8 mg Oral Daily   torsemide   20 mg Oral Daily   valACYclovir   500 mg Oral Daily   Continuous Infusions:        Audria Leather, MD [facility] 04/30/2024, 1:37 PM

## 2024-04-30 NOTE — Care Management Important Message (Signed)
 Important Message  Patient Details  Name: Ryan Long MRN: [id number] Date of Birth: Jan 18, 1961   Important Message Given:        [name] 04/30/2024, 2:33 PM

## 2024-04-30 NOTE — Plan of Care (Signed)
  Problem: Education: Goal: Knowledge of General Education information will improve Description: Including pain rating scale, medication(s)/side effects and non-pharmacologic comfort measures Outcome: Progressing   Problem: Nutrition: Goal: Adequate nutrition will be maintained Outcome: Progressing   Problem: Elimination: Goal: Will not experience complications related to bowel motility Outcome: Progressing Goal: Will not experience complications related to urinary retention Outcome: Progressing   Problem: Safety: Goal: Ability to remain free from injury will improve Outcome: Progressing

## 2024-05-01 DIAGNOSIS — I5022 Chronic systolic (congestive) heart failure: Secondary | ICD-10-CM | POA: Diagnosis not present

## 2024-05-01 NOTE — Progress Notes (Addendum)
 PROGRESS NOTE    Ryan Long  OVF:[id number] DOB: [birth year] DOA: 04/19/2024 PCP: Tretha Fu, MD   Brief Narrative:  63 y.o. male with past medical history significant for chronic combined/diastolic CHF/end-stage cardiomyopathy status post AICD (LVEF 20-25% with G1DD ), NSVT, HIV (CD4 count 429, undetectable VL 04/14/2024) , hypertension, hyperlipidemia, COPD, anxiety, depression, bipolar disorder, substance abuse, BPH, CKD stage IIIa (Cr 1.5 - 1.6) currently on home hospice presented with chest pain, shortness of breath, dizziness and headache.  He was admitted for worsening CHF and has had a long hospitalization.  Currently on oral diuretics.  Palliative care evaluated the patient.  Currently awaiting discharge to SNF with hospice.  Assessment & Plan:   End-stage chronic systolic heart failure Goals of care Hypotension -Patient has end-stage chronic systolic heart failure and was at home with hospice.  Prognosis is very poor and ICD was set off earlier this year. - Palliative care evaluated the patient.  Currently awaiting discharge to SNF with hospice.  TOC following - Continue torsemide  and midodrine   Chest pain - Possibly from above.  Troponins were within normal limits.  No further workup needed.  Continue supportive care and as needed nitroglycerin  and morphine   Hypokalemia - Resolved.  No recent labs.    BPH -Continue finasteride  and tamsulosin   HIV -Continue Biktarvy .  Continue Valtrex  due to history of recurrent HSV.  Outpatient follow-up with ID as needed  COPD -Stable.  Continue current inhaled regimen and nebs  CKD stage IIIa - Creatinine currently stable.  Will not order further labs for now as patient is awaiting discharge to SNF with hospice.  History of ventricular tachycardia - History of admission for VT storm earlier in the ER.  ICD now turned off. Continue mexiletine, amiodarone   Weakness/debility/deconditioning -Patient waiting to be  discharged to SNF with hospice.  TOC following.   DVT prophylaxis: Lovenox  Code Status: DNR Family Communication: None at bedside Disposition Plan: Status is: Inpatient Remains inpatient appropriate because: Of severity of illness  Consultants: Palliative care  Procedures: None  Antimicrobials: None   Subjective: Patient seen and examined at bedside.  Poor historian.  No fever, agitation or vomiting reported. Objective: Vitals:   04/30/24 1312 04/30/24 1440 04/30/24 1941 05/01/24 0543  BP: (!) 83/63 90/66 100/75 98/77  Pulse: 85 98 100 98  Resp:   20 18  Temp: 97.8 F (36.6 C)  97.7 F (36.5 C) (!) 97.5 F (36.4 C)  TempSrc: Oral  Oral Oral  SpO2: 100%  100% 100%  Weight:      Height:        Intake/Output Summary (Last 24 hours) at 05/01/2024 0812 Last data filed at 04/30/2024 1400 Gross per 24 hour  Intake 720 ml  Output --  Net 720 ml   Filed Weights   04/19/24 1503 04/19/24 1813  Weight: 69.4 kg 69.2 kg    Examination:  General: On room air.  No distress.  Chronically ill and deconditioned looking.  Extremely slow to respond and a poor historian with flat affect respiratory: Decreased breath sounds at bases bilaterally with some crackles CVS: Currently rate controlled; S1-S2 heard  abdominal: Soft, nontender, slightly distended, no organomegaly; bowel sounds are heard  extremities: lower extremity edema present bilaterally; no clubbing.      Data Reviewed: I have personally reviewed following labs and imaging studies  CBC: No results for input(s): "WBC", "NEUTROABS", "HGB", "HCT", "MCV", "PLT" in the last 168 hours. Basic Metabolic Panel: Recent Labs  Lab 04/25/24 1104  04/27/24 1209 04/28/24 0352  NA 137 139 137  K 3.7 2.9* 3.5  CL 101 103 103  CO2 26 22 25   GLUCOSE 106* 137* 95  BUN 14 14 17   CREATININE 1.21 1.30* 1.34*  CALCIUM  8.6* 8.4* 8.4*   GFR: Estimated Creatinine Clearance: 55.3 mL/min (A) (by C-G formula based on SCr of 1.34  mg/dL (H)). Liver Function Tests: No results for input(s): "AST", "ALT", "ALKPHOS", "BILITOT", "PROT", "ALBUMIN " in the last 168 hours. No results for input(s): "LIPASE", "AMYLASE" in the last 168 hours. No results for input(s): "AMMONIA" in the last 168 hours. Coagulation Profile: No results for input(s): "INR", "PROTIME" in the last 168 hours. Cardiac Enzymes: No results for input(s): "CKTOTAL", "CKMB", "CKMBINDEX", "TROPONINI" in the last 168 hours. BNP (last 3 results) No results for input(s): "PROBNP" in the last 8760 hours. HbA1C: No results for input(s): "HGBA1C" in the last 72 hours. CBG: No results for input(s): "GLUCAP" in the last 168 hours. Lipid Profile: No results for input(s): "CHOL", "HDL", "LDLCALC", "TRIG", "CHOLHDL", "LDLDIRECT" in the last 72 hours. Thyroid  Function Tests: No results for input(s): "TSH", "T4TOTAL", "FREET4", "T3FREE", "THYROIDAB" in the last 72 hours. Anemia Panel: No results for input(s): "VITAMINB12", "FOLATE", "FERRITIN", "TIBC", "IRON", "RETICCTPCT" in the last 72 hours. Sepsis Labs: No results for input(s): "PROCALCITON", "LATICACIDVEN" in the last 168 hours.  No results found for this or any previous visit (from the past 240 hours).       Radiology Studies: No results found.      Scheduled Meds:  albuterol   2.5 mg Nebulization BID   amiodarone   200 mg Oral Daily   aspirin  EC  81 mg Oral q AM   bictegravir-emtricitabine -tenofovir  AF  1 tablet Oral Daily   enoxaparin  (LOVENOX ) injection  40 mg Subcutaneous Q24H   feeding supplement  237 mL Oral BID BM   finasteride   5 mg Oral Daily   melatonin  10 mg Oral QHS   mexiletine  250 mg Oral Q12H   midodrine   30 mg Oral TID WC   mometasone -formoterol   2 puff Inhalation BID   potassium chloride   20 mEq Oral Daily   senna  2 tablet Oral Daily   sodium chloride  flush  3 mL Intravenous Q12H   tamsulosin   0.8 mg Oral Daily   torsemide   20 mg Oral Daily   valACYclovir   500 mg Oral  Daily   Continuous Infusions:        Audria Leather, MD [facility] 05/01/2024, 8:12 AM

## 2024-05-02 DIAGNOSIS — E876 Hypokalemia: Secondary | ICD-10-CM | POA: Diagnosis not present

## 2024-05-02 MED ORDER — MORPHINE SULFATE 10 MG/5ML PO SOLN
5.0000 mg | ORAL | Status: DC | PRN
  Administered 2024-05-02 – 2024-05-09 (×29): 5 mg via ORAL
  Filled 2024-05-02 (×29): qty 5

## 2024-05-02 NOTE — Progress Notes (Signed)
 PROGRESS NOTE    Ryan Long  BJY:[id number] DOB: [birth year] DOA: 04/19/2024 PCP: Tretha Fu, MD   Brief Narrative:  63 y.o. male with past medical history significant for chronic combined/diastolic CHF/end-stage cardiomyopathy status post AICD (LVEF 20-25% with G1DD ), NSVT, HIV (CD4 count 429, undetectable VL 04/14/2024) , hypertension, hyperlipidemia, COPD, anxiety, depression, bipolar disorder, substance abuse, BPH, CKD stage IIIa (Cr 1.5 - 1.6) currently on home hospice presented with chest pain, shortness of breath, dizziness and headache.  He was admitted for worsening CHF and has had a long hospitalization.  Currently on oral diuretics.  Palliative care evaluated the patient.  Currently awaiting discharge to SNF with hospice.  Assessment & Plan:   End-stage chronic systolic heart failure Goals of care Hypotension -Patient has end-stage chronic systolic heart failure and was at home with hospice.  Prognosis is very poor and ICD was set off earlier this year. - Palliative care evaluated the patient.  Currently awaiting discharge to SNF with hospice.  TOC following - Continue torsemide  and midodrine   Chest pain - Possibly from above.  Troponins were within normal limits.  No further workup needed.  Continue supportive care and as needed nitroglycerin  and morphine   Hypokalemia - Resolved.  No recent labs.    BPH -Continue finasteride  and tamsulosin   HIV -Continue Biktarvy .  Continue Valtrex  due to history of recurrent HSV.  Outpatient follow-up with ID as needed  COPD -Stable.  Continue current inhaled regimen and nebs  CKD stage IIIa - Creatinine currently stable.  Will not order further labs for now as patient is awaiting discharge to SNF with hospice.  History of ventricular tachycardia - History of admission for VT storm earlier in the ER.  ICD now turned off. Continue mexiletine, amiodarone   Weakness/debility/deconditioning -Patient waiting to be  discharged to SNF with hospice.  TOC following.   DVT prophylaxis: Lovenox  Code Status: DNR Family Communication: None at bedside Disposition Plan: Status is: Inpatient Remains inpatient appropriate because: Of severity of illness  Consultants: Palliative care  Procedures: None  Antimicrobials: None   Subjective: Patient seen and examined at bedside.  Poor historian.  No agitation, fever or vomiting reported. Objective: Vitals:   05/01/24 1207 05/01/24 2012 05/01/24 2032 05/02/24 0400  BP: 90/65 90/66  (!) 83/65  Pulse: 97 92  91  Resp: 18 19  18   Temp: (!) 97.5 F (36.4 C) 97.8 F (36.6 C)  97.6 F (36.4 C)  TempSrc: Oral     SpO2: 98% 100% 100% 100%  Weight:      Height:        Intake/Output Summary (Last 24 hours) at 05/02/2024 0816 Last data filed at 05/01/2024 1300 Gross per 24 hour  Intake 120 ml  Output --  Net 120 ml   Filed Weights   04/19/24 1503 04/19/24 1813  Weight: 69.4 kg 69.2 kg    Examination:  General: No acute distress.  Currently on room air.  Chronically ill and deconditioned looking.  Extremely slow to respond and a poor historian with flat affect respiratory: Bilateral decreased breath sounds at bases with scattered crackles CVS: S1-S2 heard; rate mostly controlled  abdominal: Soft, nontender, distended mildly; no organomegaly; bowel sounds are heard normally extremities: No cyanosis; bilateral lower extremity edema present   Data Reviewed: I have personally reviewed following labs and imaging studies  CBC: No results for input(s): "WBC", "NEUTROABS", "HGB", "HCT", "MCV", "PLT" in the last 168 hours. Basic Metabolic Panel: Recent Labs  Lab 04/25/24 1104 04/27/24  1209 04/28/24 0352  NA 137 139 137  K 3.7 2.9* 3.5  CL 101 103 103  CO2 26 22 25   GLUCOSE 106* 137* 95  BUN 14 14 17   CREATININE 1.21 1.30* 1.34*  CALCIUM  8.6* 8.4* 8.4*   GFR: Estimated Creatinine Clearance: 55.3 mL/min (A) (by C-G formula based on SCr of 1.34  mg/dL (H)). Liver Function Tests: No results for input(s): "AST", "ALT", "ALKPHOS", "BILITOT", "PROT", "ALBUMIN " in the last 168 hours. No results for input(s): "LIPASE", "AMYLASE" in the last 168 hours. No results for input(s): "AMMONIA" in the last 168 hours. Coagulation Profile: No results for input(s): "INR", "PROTIME" in the last 168 hours. Cardiac Enzymes: No results for input(s): "CKTOTAL", "CKMB", "CKMBINDEX", "TROPONINI" in the last 168 hours. BNP (last 3 results) No results for input(s): "PROBNP" in the last 8760 hours. HbA1C: No results for input(s): "HGBA1C" in the last 72 hours. CBG: No results for input(s): "GLUCAP" in the last 168 hours. Lipid Profile: No results for input(s): "CHOL", "HDL", "LDLCALC", "TRIG", "CHOLHDL", "LDLDIRECT" in the last 72 hours. Thyroid  Function Tests: No results for input(s): "TSH", "T4TOTAL", "FREET4", "T3FREE", "THYROIDAB" in the last 72 hours. Anemia Panel: No results for input(s): "VITAMINB12", "FOLATE", "FERRITIN", "TIBC", "IRON", "RETICCTPCT" in the last 72 hours. Sepsis Labs: No results for input(s): "PROCALCITON", "LATICACIDVEN" in the last 168 hours.  No results found for this or any previous visit (from the past 240 hours).       Radiology Studies: No results found.      Scheduled Meds:  albuterol   2.5 mg Nebulization BID   amiodarone   200 mg Oral Daily   aspirin  EC  81 mg Oral q AM   bictegravir-emtricitabine -tenofovir  AF  1 tablet Oral Daily   enoxaparin  (LOVENOX ) injection  40 mg Subcutaneous Q24H   feeding supplement  237 mL Oral BID BM   finasteride   5 mg Oral Daily   melatonin  10 mg Oral QHS   mexiletine  250 mg Oral Q12H   midodrine   30 mg Oral TID WC   mometasone -formoterol   2 puff Inhalation BID   potassium chloride   20 mEq Oral Daily   senna  2 tablet Oral Daily   sodium chloride  flush  3 mL Intravenous Q12H   tamsulosin   0.8 mg Oral Daily   torsemide   20 mg Oral Daily   valACYclovir   500 mg Oral  Daily   Continuous Infusions:        Audria Leather, MD [facility] 05/02/2024, 8:16 AM

## 2024-05-02 NOTE — TOC Progression Note (Signed)
 Transition of Care [facility]) - Progression Note    Patient Details  Name: Ryan Long MRN: [id number] Date of Birth: 1961-01-08  Transition of Care [facility] CM/SW Contact  Gertha Ku, LCSW Phone Number: 05/02/2024, 12:43 PM  Clinical Narrative:     Waiting to hear back from [facility]. TOC following   Expected Discharge Plan and Services       Living arrangements for the past 2 months: Single Family Home Expected Discharge Date: 04/28/24                                     Social Determinants of Health (SDOH) Interventions SDOH Screenings   Food Insecurity: No Food Insecurity (04/19/2024)  Housing: Low Risk  (04/19/2024)  Transportation Needs: No Transportation Needs (04/19/2024)  Utilities: Not At Risk (04/19/2024)  Alcohol  Screen: Low Risk  (07/17/2023)  Depression (PHQ2-9): Low Risk  (08/01/2023)  Financial Resource Strain: Low Risk  (07/17/2023)  Physical Activity: Insufficiently Active (07/17/2023)  Social Connections: Socially Isolated (04/19/2024)  Stress: Stress Concern Present (07/17/2023)  Tobacco Use: High Risk (04/19/2024)  Health Literacy: Adequate Health Literacy (07/18/2023)    Readmission Risk Interventions    03/19/2024    3:54 PM  Readmission Risk Prevention Plan  Transportation Screening Complete  PCP or Specialist Appt within 3-5 Days Complete  HRI or Home Care Consult Complete  Medication Review (RN Care Manager) Complete

## 2024-05-02 NOTE — Progress Notes (Signed)
 Daily Progress Note   Patient Name: Ryan Long       Date: 05/02/2024 DOB: 1961/03/25  Age: 63 y.o. MRN#: [id number] Attending Physician: Audria Leather, MD Primary Care Physician: Tretha Fu, MD Admit Date: 04/19/2024  Reason for Consultation/Follow-up: Non pain symptom management  Subjective: Palliative medicine following for symptom check.  Chart reviewed.  Goals of care have been outlined, patient is currently pending disposition-SNF with hospice. Chart reviewed, patient seen Patient complains of episodic chest discomfort as well as dyspnea sensation. Patient is on low-dose as needed oral-10 mg of p.o. morphine  have been utilized in the past 24 hours on an as-needed basis.   Length of Stay: 9  Current Medications: Scheduled Meds:   albuterol   2.5 mg Nebulization BID   amiodarone   200 mg Oral Daily   aspirin  EC  81 mg Oral q AM   bictegravir-emtricitabine -tenofovir  AF  1 tablet Oral Daily   enoxaparin  (LOVENOX ) injection  40 mg Subcutaneous Q24H   feeding supplement  237 mL Oral BID BM   finasteride   5 mg Oral Daily   melatonin  10 mg Oral QHS   mexiletine  250 mg Oral Q12H   midodrine   30 mg Oral TID WC   mometasone -formoterol   2 puff Inhalation BID   potassium chloride   20 mEq Oral Daily   senna  2 tablet Oral Daily   sodium chloride  flush  3 mL Intravenous Q12H   tamsulosin   0.8 mg Oral Daily   torsemide   20 mg Oral Daily   valACYclovir   500 mg Oral Daily    Continuous Infusions:   PRN Meds: acetaminophen  **OR** acetaminophen , albuterol , docusate sodium , lip balm, LORazepam , morphine , ondansetron  (ZOFRAN ) IV, mouth rinse, polyethylene glycol, traZODone , trimethobenzamide   Physical Exam         Awake alert Resting in bed No acute distress Does  not have peripheral edema  Vital Signs: BP 98/74 (BP Location: Right Arm)   Pulse 99   Temp 97.7 F (36.5 C) (Oral)   Resp 18   Ht 5\' 8"  (1.727 m)   Wt 69.2 kg   SpO2 99%   BMI 23.20 kg/m  SpO2: SpO2: 99 % O2 Device: O2 Device: Room Air O2 Flow Rate: O2 Flow Rate (L/min): 2 L/min  Intake/output summary:  Intake/Output Summary (Last 24 hours) at 05/02/2024  1155 Last data filed at 05/01/2024 1300 Gross per 24 hour  Intake 120 ml  Output --  Net 120 ml   LBM: Last BM Date : 05/02/24 Baseline Weight: Weight: 69.4 kg Most recent weight: Weight: 69.2 kg       Palliative Assessment/Data:      Patient Active Problem List   Diagnosis Date Noted   Acute CHF (HCC) 04/23/2024   Malnutrition of moderate degree 04/22/2024   Hypokalemia 04/19/2024   Hypotension 04/19/2024   Debility 04/19/2024   Musculoskeletal pain 03/22/2024   Acute on chronic heart failure (HCC) 03/18/2024   Chronic kidney disease, stage 3a (HCC) 02/22/2024   Acute on chronic HFrEF (heart failure with reduced ejection fraction) (HCC) 02/17/2024   Chest pain 02/17/2024   Ventricular tachycardia (HCC) 01/23/2024   Acute on chronic systolic CHF (congestive heart failure) (HCC) 01/03/2024   Asthmatic bronchitis 01/24/2023   Rash and nonspecific skin eruption 12/19/2022   Arm numbness 11/15/2022   Aortic atherosclerosis (HCC) 11/15/2022   Chronic kidney disease 08/08/2022   Goals of care, counseling/discussion 08/08/2022   Cigarette smoker 01/26/2022   ETD (Eustachian tube dysfunction), right 01/26/2022   Allergy to pollen 01/26/2022   Polycythemia 12/08/2021   Vitamin D deficiency 12/08/2021   Hip pain 01/06/2021   VT (ventricular tachycardia) (HCC) 08/25/2020   HIV positive (HCC) 07/12/2020   Paroxysmal atrial fibrillation (HCC) 06/10/2020   BPH (benign prostatic hyperplasia) 01/28/2020   HFrEF (heart failure with reduced ejection fraction) (HCC) 01/28/2020   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    Achilles tendinitis, left leg 04/03/2019   AICD (automatic cardioverter/defibrillator) present 08/15/2018   Abnormal EKG 06/28/2018   Adjustment disorder with mixed disturbance of emotions and conduct    NICM (nonischemic cardiomyopathy) (HCC) 01/08/2018   Anxiety 12/20/2017   Bipolar 1 disorder (HCC) 10/10/2017   End-stage systolic heart failure, chronic (HCC) 05/13/2017   HSV-2 infection 07/17/2016   History of attempted suicide 04/14/2016   History of alcoholism (HCC) 04/14/2016   History of substance abuse (HCC) 04/14/2016   Constipation 01/17/2016   COPD (chronic obstructive pulmonary disease) (HCC) 10/04/2015   Abnormal thyroid  stimulating hormone (TSH) level 10/04/2015   MDD (major depressive disorder), recurrent episode, severe (HCC) 09/23/2015   Hypertension 09/10/2015   PTSD (post-traumatic stress disorder) 08/07/2015   Primary insomnia 04/02/2015   Herpesviral infection of penis 04/02/2015   Depression with suicidal ideation 07/03/2014   Generalized anxiety disorder 05/29/2014   Tobacco use disorder 02/17/2013   Heart failure (HCC) 09/19/2011   Other primary cardiomyopathies 08/24/2011   HIV disease (HCC) 02/01/2009   Recurrent HSV (herpes simplex virus) 02/01/2009   Hyperlipidemia 02/01/2009   Allergic rhinitis 02/01/2009    Palliative Care Assessment & Plan   Patient Profile:   Assessment: 63 year old gentleman with chronic combined diastolic CHF end-stage cardiomyopathy status post AICD left ventricular ejection fraction 20 to 25% with grade 1 diastolic dysfunction, nonsustained ventricular tachycardia, ICD has been shut off earlier this year, patient with end-stage chronic systolic heart failure, was home with hospice prior to this hospitalization. Patient admitted for worsening CHF symptoms   Recommendations/Plan: Continue current mode of care Increase morphine  from 2.5 to 5 mg to be used on an as-needed basis every 3 hours for management of symptoms of pain  and dyspnea Otherwise, no new inpatient palliative specific recommendations at this time, will continue to follow peripherally, agree with the plan for SNF with hospice.   Code Status:    Code Status Orders  (From admission,  onward)           Start     Ordered   04/19/24 1716  Do not attempt resuscitation (DNR)- Limited -Do Not Intubate (DNI)  Continuous       Question Answer Comment  If pulseless and not breathing No CPR or chest compressions.   In Pre-Arrest Conditions (Patient Is Breathing and Has A Pulse) Do not intubate. Provide all appropriate non-invasive medical interventions. Avoid ICU transfer unless indicated or required.   Consent: Discussion documented in EHR or advanced directives reviewed      04/19/24 1719           Code Status History     Date Active Date Inactive Code Status Order ID Comments User Context   03/18/2024 0747 03/23/2024 1636 Limited: Do not attempt resuscitation (DNR) -DNR-LIMITED -Do Not Intubate/DNI  [id number]  Mandy Second, MD ED   02/17/2024 0354 03/07/2024 1929 Limited: Do not attempt resuscitation (DNR) -DNR-LIMITED -Do Not Intubate/DNI  [id number]  Juliette Oh, MD ED   01/28/2024 1240 02/08/2024 2258 Limited: Do not attempt resuscitation (DNR) -DNR-LIMITED -Do Not Intubate/DNI  [id number]  Shellye Dibble, NP Inpatient   01/25/2024 1734 01/28/2024 1240 Limited: Do not attempt resuscitation (DNR) -DNR-LIMITED -Do Not Intubate/DNI  [id number]  Lauralee Poll, MD Inpatient   01/23/2024 1811 01/25/2024 1734 Full Code [id number]  Lamond Pilot, PA ED   01/03/2024 1819 01/04/2024 0203 Full Code [id number]  Arleene Belt, PA-C Inpatient   06/25/2020 0259 07/01/2020 1706 Full Code [id number]  Sharin David, NP Inpatient   02/25/2018 1353 02/26/2018 1603 Full Code [id number]  Freeda Jerry, MD ED   01/08/2018 1649 01/09/2018 1431 Full Code [id number]  Lei Pump, MD Inpatient   10/08/2015 0435 10/10/2015 1606 Full Code  [id number]  Danny Dye, NP Inpatient   10/07/2015 1810 10/08/2015 0348 Full Code [id number]  Scarlette Currier, MD ED   09/23/2015 0604 09/28/2015 2125 Full Code [id number]  Vangie Genet, PA-C Inpatient   09/23/2015 0108 09/23/2015 0604 Full Code [id number]  Carlton Chick, MD ED   08/06/2015 1918 08/12/2015 1719 Full Code [id number]  Rankin, Shuvon B, NP Inpatient   08/04/2015 2120 08/06/2015 1918 Full Code [id number]  Franceen Inches, NP ED   08/29/2014 0118 08/31/2014 1914 Full Code [id number]  Roslynn Coombes, NP Inpatient   08/28/2014 2202 08/29/2014 0118 Full Code [id number]  Wyline Hearing, RN Inpatient   08/28/2014 2129 08/28/2014 2202 Full Code [id number]  Wyline Hearing, RN Inpatient   08/28/2014 1258 08/28/2014 2121 Full Code [id number]  Sherel Dikes, PA-C ED   07/03/2014 1332 07/14/2014 1916 Full Code [id number]  Suellyn Emory, PA-C Inpatient   07/02/2014 1922 07/03/2014 1332 Full Code [id number]  Humberto Magnus, NP Inpatient   07/02/2014 0006 07/02/2014 1922 Full Code [id number]  Tina Fordyce, NP ED   12/28/2013 1959 12/30/2013 0304 Full Code [id number]  Morrison Archer, MD ED   11/22/2013 0002 11/22/2013 0948 Full Code [id number]  Shira Dopp, MD ED   11/23/2012 2318 11/24/2012 1308 Full Code [id number]  Neomia Banner, PA ED   03/29/2012 2238 03/30/2012 1931 Full Code [id number]  Lyna Sandhoff, PA ED   03/17/2012 2155 03/19/2012 2318 Full Code [id number]  Jen Minks, MD ED      Advance Directive Documentation    Flowsheet Row Most Recent Value  Type of Advance Directive Living will  Pre-existing out of facility DNR order (yellow form or pink MOST form) --  "MOST"  Form in Place? --       Prognosis:  < 6 months  Discharge Planning: Skilled Nursing Facility with Hospice  Care plan was discussed with patient and nursing colleague  Thank you for allowing the Palliative Medicine Team to assist in the care of this patient. MOD MDM     Greater than 50%  of this time was spent  counseling and coordinating care related to the above assessment and plan.  Lujean Sake, MD  Please contact Palliative Medicine Team phone at [phone] for questions and concerns.

## 2024-05-03 ENCOUNTER — Ambulatory Visit (HOSPITAL_COMMUNITY): Admitting: Psychiatry

## 2024-05-03 ENCOUNTER — Encounter (HOSPITAL_COMMUNITY): Payer: Self-pay

## 2024-05-03 DIAGNOSIS — E876 Hypokalemia: Secondary | ICD-10-CM | POA: Diagnosis not present

## 2024-05-03 MED ORDER — TORSEMIDE 20 MG PO TABS
20.0000 mg | ORAL_TABLET | Freq: Two times a day (BID) | ORAL | Status: DC
  Administered 2024-05-03 – 2024-05-09 (×12): 20 mg via ORAL
  Filled 2024-05-03 (×13): qty 1

## 2024-05-03 MED ORDER — ALBUMIN HUMAN 25 % IV SOLN
50.0000 g | Freq: Once | INTRAVENOUS | Status: AC
  Administered 2024-05-03: 50 g via INTRAVENOUS
  Filled 2024-05-03: qty 200

## 2024-05-03 NOTE — Progress Notes (Signed)
 PROGRESS NOTE    Ryan Long  RUE:[id number] DOB: [birth year] DOA: 04/19/2024 PCP: Tretha Fu, MD   Brief Narrative:  63 y.o. male with past medical history significant for chronic combined/diastolic CHF/end-stage cardiomyopathy status post AICD (LVEF 20-25% with G1DD ), NSVT, HIV (CD4 count 429, undetectable VL 04/14/2024) , hypertension, hyperlipidemia, COPD, anxiety, depression, bipolar disorder, substance abuse, BPH, CKD stage IIIa (Cr 1.5 - 1.6) currently on home hospice presented with chest pain, shortness of breath, dizziness and headache.  He was admitted for worsening CHF and has had a long hospitalization.  Currently on oral diuretics.  Palliative care evaluated the patient.  Currently awaiting discharge to SNF with hospice.  Assessment & Plan:   End-stage chronic systolic heart failure Goals of care Hypotension -Patient has end-stage chronic systolic heart failure and was at home with hospice.  Prognosis is very poor and ICD was set off earlier this year. - Palliative care following.  Currently awaiting discharge to SNF with hospice.  TOC following - Continue torsemide  and midodrine   Chest pain - Possibly from above.  Troponins were within normal limits.  No further workup needed.  Continue supportive care and as needed nitroglycerin  and morphine   Hypokalemia - Resolved.  No recent labs.    BPH -Continue finasteride  and tamsulosin   HIV -Continue Biktarvy .  Continue Valtrex  due to history of recurrent HSV.  Outpatient follow-up with ID as needed  COPD -Stable.  Continue current inhaled regimen and nebs  CKD stage IIIa - Creatinine currently stable.  Will not order further labs for now as patient is awaiting discharge to SNF with hospice.  History of ventricular tachycardia - History of admission for VT storm earlier in the ER.  ICD now turned off. Continue mexiletine, amiodarone   Weakness/debility/deconditioning -Patient waiting to be discharged to SNF  with hospice.  TOC following.   DVT prophylaxis: Lovenox  Code Status: DNR Family Communication: None at bedside Disposition Plan: Status is: Inpatient Remains inpatient appropriate because: Of severity of illness  Consultants: Palliative care  Procedures: None  Antimicrobials: None   Subjective: Patient seen and examined at bedside.  Poor historian.  No fever, agitation or vomiting reported.  Complains of bilateral lower extremity swelling. Objective: Vitals:   05/02/24 1818 05/02/24 1937 05/02/24 1941 05/03/24 0301  BP:  100/70  101/74  Pulse:  (!) 102 91 (!) 103  Resp:  17  17  Temp:  97.8 F (36.6 C)    TempSrc:  Oral    SpO2: 98% 100%  97%  Weight:      Height:        Intake/Output Summary (Last 24 hours) at 05/03/2024 0817 Last data filed at 05/03/2024 0300 Gross per 24 hour  Intake 480 ml  Output --  Net 480 ml   Filed Weights   04/19/24 1503 04/19/24 1813  Weight: 69.4 kg 69.2 kg    Examination:  General: On room air. No distress.  Chronically ill and deconditioned looking.  Extremely slow to respond and a poor historian with flat affect respiratory: Decreased breath sounds of bases with some wheezing CVS: Currently rate controlled; S1 S2 heard abdominal: Soft, nontender, distended slightly; no organomegaly; normal bowel sounds extremities: lower extremity edema present; no cyanosis   Data Reviewed: I have personally reviewed following labs and imaging studies  CBC: No results for input(s): "WBC", "NEUTROABS", "HGB", "HCT", "MCV", "PLT" in the last 168 hours. Basic Metabolic Panel: Recent Labs  Lab 04/27/24 1209 04/28/24 0352  NA 139 137  K 2.9*  3.5  CL 103 103  CO2 22 25  GLUCOSE 137* 95  BUN 14 17  CREATININE 1.30* 1.34*  CALCIUM  8.4* 8.4*   GFR: Estimated Creatinine Clearance: 55.3 mL/min (A) (by C-G formula based on SCr of 1.34 mg/dL (H)). Liver Function Tests: No results for input(s): "AST", "ALT", "ALKPHOS", "BILITOT", "PROT",  "ALBUMIN " in the last 168 hours. No results for input(s): "LIPASE", "AMYLASE" in the last 168 hours. No results for input(s): "AMMONIA" in the last 168 hours. Coagulation Profile: No results for input(s): "INR", "PROTIME" in the last 168 hours. Cardiac Enzymes: No results for input(s): "CKTOTAL", "CKMB", "CKMBINDEX", "TROPONINI" in the last 168 hours. BNP (last 3 results) No results for input(s): "PROBNP" in the last 8760 hours. HbA1C: No results for input(s): "HGBA1C" in the last 72 hours. CBG: No results for input(s): "GLUCAP" in the last 168 hours. Lipid Profile: No results for input(s): "CHOL", "HDL", "LDLCALC", "TRIG", "CHOLHDL", "LDLDIRECT" in the last 72 hours. Thyroid  Function Tests: No results for input(s): "TSH", "T4TOTAL", "FREET4", "T3FREE", "THYROIDAB" in the last 72 hours. Anemia Panel: No results for input(s): "VITAMINB12", "FOLATE", "FERRITIN", "TIBC", "IRON", "RETICCTPCT" in the last 72 hours. Sepsis Labs: No results for input(s): "PROCALCITON", "LATICACIDVEN" in the last 168 hours.  No results found for this or any previous visit (from the past 240 hours).       Radiology Studies: No results found.      Scheduled Meds:  albuterol   2.5 mg Nebulization BID   amiodarone   200 mg Oral Daily   aspirin  EC  81 mg Oral q AM   bictegravir-emtricitabine -tenofovir  AF  1 tablet Oral Daily   enoxaparin  (LOVENOX ) injection  40 mg Subcutaneous Q24H   feeding supplement  237 mL Oral BID BM   finasteride   5 mg Oral Daily   melatonin  10 mg Oral QHS   mexiletine  250 mg Oral Q12H   midodrine   30 mg Oral TID WC   mometasone -formoterol   2 puff Inhalation BID   potassium chloride   20 mEq Oral Daily   senna  2 tablet Oral Daily   sodium chloride  flush  3 mL Intravenous Q12H   tamsulosin   0.8 mg Oral Daily   torsemide   20 mg Oral Daily   valACYclovir   500 mg Oral Daily   Continuous Infusions:        Audria Leather, MD [facility] 05/03/2024, 8:17 AM

## 2024-05-04 DIAGNOSIS — E876 Hypokalemia: Secondary | ICD-10-CM | POA: Diagnosis not present

## 2024-05-04 NOTE — Progress Notes (Signed)
 PROGRESS NOTE    Ryan Long  ZOX:[id number] DOB: 16-May-1961 DOA: 04/19/2024 PCP: Tretha Fu, MD   Brief Narrative:  63 y.o. male with past medical history significant for chronic combined/diastolic CHF/end-stage cardiomyopathy status post AICD (LVEF 20-25% with G1DD ), NSVT, HIV (CD4 count 429, undetectable VL 04/14/2024) , hypertension, hyperlipidemia, COPD, anxiety, depression, bipolar disorder, substance abuse, BPH, CKD stage IIIa (Cr 1.5 - 1.6) currently on home hospice presented with chest pain, shortness of breath, dizziness and headache.  He was admitted for worsening CHF and has had a long hospitalization.  Currently on oral diuretics.  Palliative care evaluated the patient.  Currently awaiting discharge to SNF with hospice.  Assessment & Plan:   End-stage chronic systolic heart failure Goals of care Hypotension -Patient has end-stage chronic systolic heart failure and was at home with hospice.  Prognosis is very poor and ICD was set off earlier this year. - Palliative care following.  Currently awaiting discharge to SNF with hospice.  TOC following - Continue torsemide  and midodrine   Chest pain - Possibly from above.  Troponins were within normal limits.  No further workup needed.  Continue supportive care and as needed nitroglycerin  and morphine   Hypokalemia - Resolved.  No recent labs.    BPH -Continue finasteride  and tamsulosin   HIV -Continue Biktarvy .  Continue Valtrex  due to history of recurrent HSV.  Outpatient follow-up with ID as needed  COPD -Stable.  Continue current inhaled regimen and nebs  CKD stage IIIa - Creatinine currently stable.  Will not order further labs for now as patient is awaiting discharge to SNF with hospice.  History of ventricular tachycardia - History of admission for VT storm earlier in the ER.  ICD now turned off. Continue mexiletine, amiodarone   Weakness/debility/deconditioning -Patient waiting to be discharged to SNF  with hospice.  TOC following.   DVT prophylaxis: Lovenox  Code Status: DNR Family Communication: None at bedside Disposition Plan: Status is: Inpatient Remains inpatient appropriate because: Of severity of illness  Consultants: Palliative care  Procedures: None  Antimicrobials: None   Subjective: Patient seen and examined at bedside.  Poor historian.  No vomiting, fever or chest pain reported.  Leg swelling improving. Objective: Vitals:   05/03/24 2056 05/03/24 2136 05/04/24 0133 05/04/24 0457  BP:  (!) 88/64 92/63 91/66   Pulse: 85 91 83 79  Resp: 20 18 17 18   Temp:   98 F (36.7 C) 98 F (36.7 C)  TempSrc:   Oral Oral  SpO2: 99% 97% 100% 100%  Weight:      Height:        Intake/Output Summary (Last 24 hours) at 05/04/2024 0802 Last data filed at 05/04/2024 0500 Gross per 24 hour  Intake 503 ml  Output --  Net 503 ml   Filed Weights   04/19/24 1503 04/19/24 1813  Weight: 69.4 kg 69.2 kg    Examination:  General: No acute distress. No acute distress.  Chronically ill and deconditioned looking.  Extremely slow to respond and a poor historian with flat affect respiratory: Bilateral decreased breath sounds at bases with scattered crackles CVS: S1 S2 heard; rate mostly controlled abdominal: Soft, nontender, mildly distended; no organomegaly; bowel sounds heard extremities: No clubbing; bilateral lower extremities swelling present   Data Reviewed: I have personally reviewed following labs and imaging studies  CBC: No results for input(s): "WBC", "NEUTROABS", "HGB", "HCT", "MCV", "PLT" in the last 168 hours. Basic Metabolic Panel: Recent Labs  Lab 04/27/24 1209 04/28/24 0352  NA 139 137  K 2.9* 3.5  CL 103 103  CO2 22 25  GLUCOSE 137* 95  BUN 14 17  CREATININE 1.30* 1.34*  CALCIUM  8.4* 8.4*   GFR: Estimated Creatinine Clearance: 55.3 mL/min (A) (by C-G formula based on SCr of 1.34 mg/dL (H)). Liver Function Tests: No results for input(s): "AST",  "ALT", "ALKPHOS", "BILITOT", "PROT", "ALBUMIN " in the last 168 hours. No results for input(s): "LIPASE", "AMYLASE" in the last 168 hours. No results for input(s): "AMMONIA" in the last 168 hours. Coagulation Profile: No results for input(s): "INR", "PROTIME" in the last 168 hours. Cardiac Enzymes: No results for input(s): "CKTOTAL", "CKMB", "CKMBINDEX", "TROPONINI" in the last 168 hours. BNP (last 3 results) No results for input(s): "PROBNP" in the last 8760 hours. HbA1C: No results for input(s): "HGBA1C" in the last 72 hours. CBG: No results for input(s): "GLUCAP" in the last 168 hours. Lipid Profile: No results for input(s): "CHOL", "HDL", "LDLCALC", "TRIG", "CHOLHDL", "LDLDIRECT" in the last 72 hours. Thyroid  Function Tests: No results for input(s): "TSH", "T4TOTAL", "FREET4", "T3FREE", "THYROIDAB" in the last 72 hours. Anemia Panel: No results for input(s): "VITAMINB12", "FOLATE", "FERRITIN", "TIBC", "IRON", "RETICCTPCT" in the last 72 hours. Sepsis Labs: No results for input(s): "PROCALCITON", "LATICACIDVEN" in the last 168 hours.  No results found for this or any previous visit (from the past 240 hours).       Radiology Studies: No results found.      Scheduled Meds:  albuterol   2.5 mg Nebulization BID   amiodarone   200 mg Oral Daily   aspirin  EC  81 mg Oral q AM   bictegravir-emtricitabine -tenofovir  AF  1 tablet Oral Daily   enoxaparin  (LOVENOX ) injection  40 mg Subcutaneous Q24H   feeding supplement  237 mL Oral BID BM   finasteride   5 mg Oral Daily   melatonin  10 mg Oral QHS   mexiletine  250 mg Oral Q12H   midodrine   30 mg Oral TID WC   mometasone -formoterol   2 puff Inhalation BID   potassium chloride   20 mEq Oral Daily   senna  2 tablet Oral Daily   sodium chloride  flush  3 mL Intravenous Q12H   tamsulosin   0.8 mg Oral Daily   torsemide   20 mg Oral BID   valACYclovir   500 mg Oral Daily   Continuous Infusions:        Audria Leather, MD [facility] 05/04/2024, 8:02 AM

## 2024-05-04 NOTE — Progress Notes (Signed)
 Daily Progress Note   Patient Name: Ryan Long       Date: 05/04/2024 DOB: June 30, 1961  Age: 63 y.o. MRN#: [id number] Attending Physician: Audria Leather, MD Primary Care Physician: Tretha Fu, MD Admit Date: 04/19/2024  Reason for Consultation/Follow-up: Non pain symptom management  Subjective: Palliative medicine following for symptom check.      Length of Stay: 11  Current Medications: Scheduled Meds:   albuterol   2.5 mg Nebulization BID   amiodarone   200 mg Oral Daily   aspirin  EC  81 mg Oral q AM   bictegravir-emtricitabine -tenofovir  AF  1 tablet Oral Daily   enoxaparin  (LOVENOX ) injection  40 mg Subcutaneous Q24H   feeding supplement  237 mL Oral BID BM   finasteride   5 mg Oral Daily   melatonin  10 mg Oral QHS   mexiletine  250 mg Oral Q12H   midodrine   30 mg Oral TID WC   mometasone -formoterol   2 puff Inhalation BID   potassium chloride   20 mEq Oral Daily   senna  2 tablet Oral Daily   sodium chloride  flush  3 mL Intravenous Q12H   tamsulosin   0.8 mg Oral Daily   torsemide   20 mg Oral BID   valACYclovir   500 mg Oral Daily    Continuous Infusions:   PRN Meds: acetaminophen  **OR** acetaminophen , albuterol , docusate sodium , lip balm, LORazepam , morphine , ondansetron  (ZOFRAN ) IV, mouth rinse, polyethylene glycol, traZODone , trimethobenzamide   Physical Exam         Awake alert Resting in bed No acute distress Does not have peripheral edema  Vital Signs: BP 96/73 (BP Location: Right Arm)   Pulse 85   Temp 97.8 F (36.6 C) (Oral)   Resp 16   Ht 5\' 8"  (1.727 m)   Wt 69.2 kg   SpO2 100%   BMI 23.20 kg/m  SpO2: SpO2: 100 % O2 Device: O2 Device:  (nebulizer) O2 Flow Rate: O2 Flow Rate (L/min): 2 L/min  Intake/output summary:  Intake/Output  Summary (Last 24 hours) at 05/04/2024 1217 Last data filed at 05/04/2024 1100 Gross per 24 hour  Intake 740 ml  Output --  Net 740 ml   LBM: Last BM Date : 05/04/24 Baseline Weight: Weight: 69.4 kg Most recent weight: Weight: 69.2 kg       Palliative Assessment/Data:  Patient Active Problem List   Diagnosis Date Noted   Acute CHF (HCC) 04/23/2024   Malnutrition of moderate degree 04/22/2024   Hypokalemia 04/19/2024   Hypotension 04/19/2024   Debility 04/19/2024   Musculoskeletal pain 03/22/2024   Acute on chronic heart failure (HCC) 03/18/2024   Chronic kidney disease, stage 3a (HCC) 02/22/2024   Acute on chronic HFrEF (heart failure with reduced ejection fraction) (HCC) 02/17/2024   Chest pain 02/17/2024   Ventricular tachycardia (HCC) 01/23/2024   Acute on chronic systolic CHF (congestive heart failure) (HCC) 01/03/2024   Asthmatic bronchitis 01/24/2023   Rash and nonspecific skin eruption 12/19/2022   Arm numbness 11/15/2022   Aortic atherosclerosis (HCC) 11/15/2022   Chronic kidney disease 08/08/2022   Goals of care, counseling/discussion 08/08/2022   Cigarette smoker 01/26/2022   ETD (Eustachian tube dysfunction), right 01/26/2022   Allergy to pollen 01/26/2022   Polycythemia 12/08/2021   Vitamin D deficiency 12/08/2021   Hip pain 01/06/2021   VT (ventricular tachycardia) (HCC) 08/25/2020   HIV positive (HCC) 07/12/2020   Paroxysmal atrial fibrillation (HCC) 06/10/2020   BPH (benign prostatic hyperplasia) 01/28/2020   HFrEF (heart failure with reduced ejection fraction) (HCC) 01/28/2020   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   Achilles tendinitis, left leg 04/03/2019   AICD (automatic cardioverter/defibrillator) present 08/15/2018   Abnormal EKG 06/28/2018   Adjustment disorder with mixed disturbance of emotions and conduct    NICM (nonischemic cardiomyopathy) (HCC) 01/08/2018   Anxiety 12/20/2017   Bipolar 1 disorder (HCC) 10/10/2017   End-stage  systolic heart failure, chronic (HCC) 05/13/2017   HSV-2 infection 07/17/2016   History of attempted suicide 04/14/2016   History of alcoholism (HCC) 04/14/2016   History of substance abuse (HCC) 04/14/2016   Constipation 01/17/2016   COPD (chronic obstructive pulmonary disease) (HCC) 10/04/2015   Abnormal thyroid  stimulating hormone (TSH) level 10/04/2015   MDD (major depressive disorder), recurrent episode, severe (HCC) 09/23/2015   Hypertension 09/10/2015   PTSD (post-traumatic stress disorder) 08/07/2015   Primary insomnia 04/02/2015   Herpesviral infection of penis 04/02/2015   Depression with suicidal ideation 07/03/2014   Generalized anxiety disorder 05/29/2014   Tobacco use disorder 02/17/2013   Heart failure (HCC) 09/19/2011   Other primary cardiomyopathies 08/24/2011   HIV disease (HCC) 02/01/2009   Recurrent HSV (herpes simplex virus) 02/01/2009   Hyperlipidemia 02/01/2009   Allergic rhinitis 02/01/2009    Palliative Care Assessment & Plan   Patient Profile:   Assessment: 63 year old gentleman with chronic combined diastolic CHF end-stage cardiomyopathy status post AICD left ventricular ejection fraction 20 to 25% with grade 1 diastolic dysfunction, nonsustained ventricular tachycardia, ICD has been shut off earlier this year, patient with end-stage chronic systolic heart failure, was home with hospice prior to this hospitalization. Patient admitted for worsening CHF symptoms   Recommendations/Plan: Continue current mode of care  Morphine  5 mg to be used on an as-needed basis every 3 hours for management of symptoms of pain and dyspnea: 5 doses in the past 24 hours.  Otherwise, no new inpatient palliative specific recommendations at this time, will continue to follow peripherally, agree with the plan for SNF with hospice.   Code Status:    Code Status Orders  (From admission, onward)           Start     Ordered   04/19/24 1716  Do not attempt  resuscitation (DNR)- Limited -Do Not Intubate (DNI)  Continuous       Question Answer Comment  If pulseless and not breathing  No CPR or chest compressions.   In Pre-Arrest Conditions (Patient Is Breathing and Has A Pulse) Do not intubate. Provide all appropriate non-invasive medical interventions. Avoid ICU transfer unless indicated or required.   Consent: Discussion documented in EHR or advanced directives reviewed      04/19/24 1719           Code Status History     Date Active Date Inactive Code Status Order ID Comments User Context   03/18/2024 0747 03/23/2024 1636 Limited: Do not attempt resuscitation (DNR) -DNR-LIMITED -Do Not Intubate/DNI  [id number]  Mandy Second, MD ED   02/17/2024 0354 03/07/2024 1929 Limited: Do not attempt resuscitation (DNR) -DNR-LIMITED -Do Not Intubate/DNI  [id number]  Juliette Oh, MD ED   01/28/2024 1240 02/08/2024 2258 Limited: Do not attempt resuscitation (DNR) -DNR-LIMITED -Do Not Intubate/DNI  [id number]  Shellye Dibble, NP Inpatient   01/25/2024 1734 01/28/2024 1240 Limited: Do not attempt resuscitation (DNR) -DNR-LIMITED -Do Not Intubate/DNI  [id number]  Lauralee Poll, MD Inpatient   01/23/2024 1811 01/25/2024 1734 Full Code [id number]  Lamond Pilot, PA ED   01/03/2024 1819 01/04/2024 0203 Full Code [id number]  Arleene Belt, PA-C Inpatient   06/25/2020 0259 07/01/2020 1706 Full Code [id number]  Sharin David, NP Inpatient   02/25/2018 1353 02/26/2018 1603 Full Code [id number]  Freeda Jerry, MD ED   01/08/2018 1649 01/09/2018 1431 Full Code [id number]  Lei Pump, MD Inpatient   10/08/2015 0435 10/10/2015 1606 Full Code [id number]  Danny Dye, NP Inpatient   10/07/2015 1810 10/08/2015 0348 Full Code [id number]  Scarlette Currier, MD ED   09/23/2015 0604 09/28/2015 2125 Full Code [id number]  Vangie Genet, PA-C Inpatient   09/23/2015 0108 09/23/2015 0604 Full Code [id number]  Carlton Chick, MD ED   08/06/2015 1918 08/12/2015  1719 Full Code [id number]  Rankin, Shuvon B, NP Inpatient   08/04/2015 2120 08/06/2015 1918 Full Code [id number]  Franceen Inches, NP ED   08/29/2014 0118 08/31/2014 1914 Full Code [id number]  Roslynn Coombes, NP Inpatient   08/28/2014 2202 08/29/2014 0118 Full Code [id number]  Wyline Hearing, RN Inpatient   08/28/2014 2129 08/28/2014 2202 Full Code [id number]  Wyline Hearing, RN Inpatient   08/28/2014 1258 08/28/2014 2121 Full Code [id number]  Sherel Dikes, PA-C ED   07/03/2014 1332 07/14/2014 1916 Full Code [id number]  Suellyn Emory, PA-C Inpatient   07/02/2014 1922 07/03/2014 1332 Full Code [id number]  Humberto Magnus, NP Inpatient   07/02/2014 0006 07/02/2014 1922 Full Code [id number]  Tina Fordyce, NP ED   12/28/2013 1959 12/30/2013 0304 Full Code [id number]  Morrison Archer, MD ED   11/22/2013 0002 11/22/2013 0948 Full Code [id number]  Shira Dopp, MD ED   11/23/2012 2318 11/24/2012 1308 Full Code [id number]  Neomia Banner, PA ED   03/29/2012 2238 03/30/2012 1931 Full Code [id number]  Lyna Sandhoff, PA ED   03/17/2012 2155 03/19/2012 2318 Full Code [id number]  Jen Minks, MD ED      Advance Directive Documentation    Flowsheet Row Most Recent Value  Type of Advance Directive Living will  Pre-existing out of facility DNR order (yellow form or pink MOST form) --  "MOST" Form in Place? --       Prognosis:  < 6 months  Discharge Planning: Skilled Nursing Facility with Hospice  Care plan was discussed with IDT  Thank you for allowing the Palliative Medicine Team to assist in the care of this patient. low MDM  Greater than 50%  of this time was spent counseling and coordinating care related to the above assessment and plan.  Lujean Sake, MD  Please contact Palliative Medicine Team phone at [phone] for questions and concerns.

## 2024-05-05 DIAGNOSIS — E876 Hypokalemia: Secondary | ICD-10-CM | POA: Diagnosis not present

## 2024-05-05 DIAGNOSIS — I5022 Chronic systolic (congestive) heart failure: Secondary | ICD-10-CM | POA: Diagnosis not present

## 2024-05-05 MED ORDER — TORSEMIDE 20 MG PO TABS: 40.0000 mg | ORAL_TABLET | Freq: Two times a day (BID) | ORAL | Status: DC

## 2024-05-05 NOTE — Progress Notes (Signed)
   05/05/24 1455  Spiritual Encounters  Type of Visit Follow up  Care provided to: Patient  Referral source Physician  Reason for visit Routine spiritual support   Per referral from palliative care team, I visited with [name].  [name] debriefed with me around events since last hospitalization. He processed feelings about uncertainty as he faces change in care planning and maybe SNF.  I provided compassionate presence and relational support. I invited some reframing of certain concerns and encouraged naming positive aspects of change (eg, relief knowing support is near).  Will follow up as able.  Davey Bergsma L. Minetta Aly, M.Div [phone]

## 2024-05-05 NOTE — Progress Notes (Signed)
 Daily Progress Note   Patient Name: Ryan Long       Date: 05/05/2024 DOB: 03/31/1961  Age: 63 y.o. MRN#: [id number] Attending Physician: Audria Leather, MD Primary Care Physician: Tretha Fu, MD Admit Date: 04/19/2024  Reason for Consultation/Follow-up: Non pain symptom management  Subjective: Palliative medicine following for symptom check. Awake alert, resting in bed, received one dose of PO Morphine  a few minutes ago.    Length of Stay: 12  Current Medications: Scheduled Meds:   albuterol   2.5 mg Nebulization BID   amiodarone   200 mg Oral Daily   aspirin  EC  81 mg Oral q AM   bictegravir-emtricitabine -tenofovir  AF  1 tablet Oral Daily   enoxaparin  (LOVENOX ) injection  40 mg Subcutaneous Q24H   feeding supplement  237 mL Oral BID BM   finasteride   5 mg Oral Daily   melatonin  10 mg Oral QHS   mexiletine  250 mg Oral Q12H   midodrine   30 mg Oral TID WC   mometasone -formoterol   2 puff Inhalation BID   potassium chloride   20 mEq Oral Daily   senna  2 tablet Oral Daily   sodium chloride  flush  3 mL Intravenous Q12H   tamsulosin   0.8 mg Oral Daily   torsemide   20 mg Oral BID   valACYclovir   500 mg Oral Daily    Continuous Infusions:   PRN Meds: acetaminophen  **OR** acetaminophen , albuterol , docusate sodium , lip balm, LORazepam , morphine , ondansetron  (ZOFRAN ) IV, mouth rinse, polyethylene glycol, traZODone , trimethobenzamide   Physical Exam         Awake alert Resting in bed No acute distress Does not have peripheral edema  Vital Signs: BP 103/65 (BP Location: Right Arm)   Pulse 82   Temp 97.7 F (36.5 C) (Oral)   Resp 18   Ht 5\' 8"  (1.727 m)   Wt 69.2 kg   SpO2 98%   BMI 23.20 kg/m  SpO2: SpO2: 98 % O2 Device: O2 Device: Room Air O2 Flow Rate:  O2 Flow Rate (L/min): 2 L/min  Intake/output summary: No intake or output data in the 24 hours ending 05/05/24 1104  LBM: Last BM Date : 05/04/24 Baseline Weight: Weight: 69.4 kg Most recent weight: Weight: 69.2 kg       Palliative Assessment/Data:      Patient Active Problem List  Diagnosis Date Noted   Acute CHF (HCC) 04/23/2024   Malnutrition of moderate degree 04/22/2024   Hypokalemia 04/19/2024   Hypotension 04/19/2024   Debility 04/19/2024   Musculoskeletal pain 03/22/2024   Acute on chronic heart failure (HCC) 03/18/2024   Chronic kidney disease, stage 3a (HCC) 02/22/2024   Acute on chronic HFrEF (heart failure with reduced ejection fraction) (HCC) 02/17/2024   Chest pain 02/17/2024   Ventricular tachycardia (HCC) 01/23/2024   Acute on chronic systolic CHF (congestive heart failure) (HCC) 01/03/2024   Asthmatic bronchitis 01/24/2023   Rash and nonspecific skin eruption 12/19/2022   Arm numbness 11/15/2022   Aortic atherosclerosis (HCC) 11/15/2022   Chronic kidney disease 08/08/2022   Goals of care, counseling/discussion 08/08/2022   Cigarette smoker 01/26/2022   ETD (Eustachian tube dysfunction), right 01/26/2022   Allergy to pollen 01/26/2022   Polycythemia 12/08/2021   Vitamin D deficiency 12/08/2021   Hip pain 01/06/2021   VT (ventricular tachycardia) (HCC) 08/25/2020   HIV positive (HCC) 07/12/2020   Paroxysmal atrial fibrillation (HCC) 06/10/2020   BPH (benign prostatic hyperplasia) 01/28/2020   HFrEF (heart failure with reduced ejection fraction) (HCC) 01/28/2020   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   Achilles tendinitis, left leg 04/03/2019   AICD (automatic cardioverter/defibrillator) present 08/15/2018   Abnormal EKG 06/28/2018   Adjustment disorder with mixed disturbance of emotions and conduct    NICM (nonischemic cardiomyopathy) (HCC) 01/08/2018   Anxiety 12/20/2017   Bipolar 1 disorder (HCC) 10/10/2017   End-stage systolic heart failure,  chronic (HCC) 05/13/2017   HSV-2 infection 07/17/2016   History of attempted suicide 04/14/2016   History of alcoholism (HCC) 04/14/2016   History of substance abuse (HCC) 04/14/2016   Constipation 01/17/2016   COPD (chronic obstructive pulmonary disease) (HCC) 10/04/2015   Abnormal thyroid  stimulating hormone (TSH) level 10/04/2015   MDD (major depressive disorder), recurrent episode, severe (HCC) 09/23/2015   Hypertension 09/10/2015   PTSD (post-traumatic stress disorder) 08/07/2015   Primary insomnia 04/02/2015   Herpesviral infection of penis 04/02/2015   Depression with suicidal ideation 07/03/2014   Generalized anxiety disorder 05/29/2014   Tobacco use disorder 02/17/2013   Heart failure (HCC) 09/19/2011   Other primary cardiomyopathies 08/24/2011   HIV disease (HCC) 02/01/2009   Recurrent HSV (herpes simplex virus) 02/01/2009   Hyperlipidemia 02/01/2009   Allergic rhinitis 02/01/2009    Palliative Care Assessment & Plan   Patient Profile:   Assessment: 63 year old gentleman with chronic combined diastolic CHF end-stage cardiomyopathy status post AICD left ventricular ejection fraction 20 to 25% with grade 1 diastolic dysfunction, nonsustained ventricular tachycardia, ICD has been shut off earlier this year, patient with end-stage chronic systolic heart failure, was home with hospice prior to this hospitalization. Patient admitted for worsening CHF symptoms   Recommendations/Plan: Continue current mode of care  Morphine  5 mg to be used on an as-needed basis every 3 hours for management of symptoms of pain and dyspnea: 5 doses in the past 24 hours.  Otherwise, no new inpatient palliative specific recommendations at this time, will continue to follow peripherally, agree with the plan for SNF with hospice.   Code Status:    Code Status Orders  (From admission, onward)           Start     Ordered   04/19/24 1716  Do not attempt resuscitation (DNR)- Limited -Do Not  Intubate (DNI)  Continuous       Question Answer Comment  If pulseless and not breathing No CPR or chest compressions.  In Pre-Arrest Conditions (Patient Is Breathing and Has A Pulse) Do not intubate. Provide all appropriate non-invasive medical interventions. Avoid ICU transfer unless indicated or required.   Consent: Discussion documented in EHR or advanced directives reviewed      04/19/24 1719           Code Status History     Date Active Date Inactive Code Status Order ID Comments User Context   03/18/2024 0747 03/23/2024 1636 Limited: Do not attempt resuscitation (DNR) -DNR-LIMITED -Do Not Intubate/DNI  [id number]  Mandy Second, MD ED   02/17/2024 0354 03/07/2024 1929 Limited: Do not attempt resuscitation (DNR) -DNR-LIMITED -Do Not Intubate/DNI  [id number]  Juliette Oh, MD ED   01/28/2024 1240 02/08/2024 2258 Limited: Do not attempt resuscitation (DNR) -DNR-LIMITED -Do Not Intubate/DNI  [id number]  Shellye Dibble, NP Inpatient   01/25/2024 1734 01/28/2024 1240 Limited: Do not attempt resuscitation (DNR) -DNR-LIMITED -Do Not Intubate/DNI  [id number]  Lauralee Poll, MD Inpatient   01/23/2024 1811 01/25/2024 1734 Full Code [id number]  Lamond Pilot, PA ED   01/03/2024 1819 01/04/2024 0203 Full Code [id number]  Arleene Belt, PA-C Inpatient   06/25/2020 0259 07/01/2020 1706 Full Code [id number]  Sharin David, NP Inpatient   02/25/2018 1353 02/26/2018 1603 Full Code [id number]  Freeda Jerry, MD ED   01/08/2018 1649 01/09/2018 1431 Full Code [id number]  Lei Pump, MD Inpatient   10/08/2015 0435 10/10/2015 1606 Full Code [id number]  Danny Dye, NP Inpatient   10/07/2015 1810 10/08/2015 0348 Full Code [id number]  Scarlette Currier, MD ED   09/23/2015 0604 09/28/2015 2125 Full Code [id number]  Vangie Genet, PA-C Inpatient   09/23/2015 0108 09/23/2015 0604 Full Code [id number]  Carlton Chick, MD ED   08/06/2015 1918 08/12/2015 1719 Full Code [id number]  Rankin,  Shuvon B, NP Inpatient   08/04/2015 2120 08/06/2015 1918 Full Code [id number]  Franceen Inches, NP ED   08/29/2014 0118 08/31/2014 1914 Full Code [id number]  Roslynn Coombes, NP Inpatient   08/28/2014 2202 08/29/2014 0118 Full Code [id number]  Wyline Hearing, RN Inpatient   08/28/2014 2129 08/28/2014 2202 Full Code [id number]  Wyline Hearing, RN Inpatient   08/28/2014 1258 08/28/2014 2121 Full Code [id number]  Sherel Dikes, PA-C ED   07/03/2014 1332 07/14/2014 1916 Full Code [id number]  Suellyn Emory, PA-C Inpatient   07/02/2014 1922 07/03/2014 1332 Full Code [id number]  Humberto Magnus, NP Inpatient   07/02/2014 0006 07/02/2014 1922 Full Code [id number]  Tina Fordyce, NP ED   12/28/2013 1959 12/30/2013 0304 Full Code [id number]  Morrison Archer, MD ED   11/22/2013 0002 11/22/2013 0948 Full Code [id number]  Shira Dopp, MD ED   11/23/2012 2318 11/24/2012 1308 Full Code [id number]  Neomia Banner, PA ED   03/29/2012 2238 03/30/2012 1931 Full Code [id number]  Lyna Sandhoff, PA ED   03/17/2012 2155 03/19/2012 2318 Full Code [id number]  Jen Minks, MD ED      Advance Directive Documentation    Flowsheet Row Most Recent Value  Type of Advance Directive Living will  Pre-existing out of facility DNR order (yellow form or pink MOST form) --  "MOST" Form in Place? --       Prognosis:  < 6 months  Discharge Planning: Skilled Nursing Facility with Hospice  Care plan was discussed with patient and RN.   Thank you for allowing the Palliative Medicine Team to assist in the care of this patient. low MDM  Greater than 50%  of this time was spent counseling and coordinating care related to the above assessment and plan.  Lujean Sake, MD  Please contact Palliative Medicine Team phone at [phone] for questions and concerns.

## 2024-05-05 NOTE — TOC Progression Note (Addendum)
 Transition of Care [facility]) - Progression Note    Patient Details  Name: Lue [place]uque MRN: [id number] Date of Birth: 09-24-1961  Transition of Care [facility]) CM/SW Contact  Gertha Ku, LCSW Phone Number: 05/05/2024, 11:07 AM  Clinical Narrative:     CSW is awaiting a response from [name].   Pt was faxed out to additional skilled nursing facilities. [facility] is reviewing pt for possible LTC placement. TOC to follow.   Adden  1:00pm CSW spoke with [place] from [facility] [employer]. They initially offered the pt a bed at [facility]; however, after further review of the pt's chart, CSW received a message that the facility is unable to accept the pt due to a recent SI with last admission.   TOC leadership was updated. TOC to follow.    Expected Discharge Plan and Services       Living arrangements for the past 2 months: Single Family Home Expected Discharge Date: 04/28/24                                     Social Determinants of Health (SDOH) Interventions SDOH Screenings   Food Insecurity: No Food Insecurity (04/19/2024)  Housing: Low Risk  (04/19/2024)  Transportation Needs: No Transportation Needs (04/19/2024)  Utilities: Not At Risk (04/19/2024)  Alcohol  Screen: Low Risk  (07/17/2023)  Depression (PHQ2-9): Low Risk  (08/01/2023)  Financial Resource Strain: Low Risk  (07/17/2023)  Physical Activity: Insufficiently Active (07/17/2023)  Social Connections: Socially Isolated (04/19/2024)  Stress: Stress Concern Present (07/17/2023)  Tobacco Use: High Risk (04/19/2024)  Health Literacy: Adequate Health Literacy (07/18/2023)    Readmission Risk Interventions    03/19/2024    3:54 PM  Readmission Risk Prevention Plan  Transportation Screening Complete  PCP or Specialist Appt within 3-5 Days Complete  HRI or Home Care Consult Complete  Medication Review (RN Care Manager) Complete

## 2024-05-05 NOTE — Progress Notes (Signed)
 PROGRESS NOTE    Ryan Long  CBJ:[id number] DOB: 04/26/1961 DOA: 04/19/2024 PCP: Tretha Fu, MD   Brief Narrative:  63 y.o. male with past medical history significant for chronic combined/diastolic CHF/end-stage cardiomyopathy status post AICD (LVEF 20-25% with G1DD ), NSVT, HIV (CD4 count 429, undetectable VL 04/14/2024) , hypertension, hyperlipidemia, COPD, anxiety, depression, bipolar disorder, substance abuse, BPH, CKD stage IIIa (Cr 1.5 - 1.6) currently on home hospice presented with chest pain, shortness of breath, dizziness and headache.  He was admitted for worsening CHF and has had a long hospitalization.  Currently on oral diuretics.  Palliative care evaluated the patient.  Currently awaiting discharge to SNF with hospice.  Assessment & Plan:   End-stage chronic systolic heart failure Goals of care Hypotension -Patient has end-stage chronic systolic heart failure and was at home with hospice.  Prognosis is very poor and ICD was set off earlier this year. - Palliative care following.  Currently awaiting discharge to SNF with hospice.  TOC following - Continue torsemide  and midodrine   Chest pain - Possibly from above.  Troponins were within normal limits.  No further workup needed.  Continue supportive care and as needed nitroglycerin  and morphine   Hypokalemia - Resolved.  No recent labs.    BPH -Continue finasteride  and tamsulosin   HIV -Continue Biktarvy .  Continue Valtrex  due to history of recurrent HSV.  Outpatient follow-up with ID as needed  COPD -Stable.  Continue current inhaled regimen and nebs  CKD stage IIIa - Creatinine currently stable.  Will not order further labs for now as patient is awaiting discharge to SNF with hospice.  History of ventricular tachycardia - History of admission for VT storm earlier in the ER.  ICD now turned off. Continue mexiletine, amiodarone   Weakness/debility/deconditioning -Patient waiting to be discharged to SNF  with hospice.  TOC following.   DVT prophylaxis: Lovenox  Code Status: DNR Family Communication: None at bedside Disposition Plan: Status is: Inpatient Remains inpatient appropriate because: Of severity of illness  Consultants: Palliative care  Procedures: None  Antimicrobials: None   Subjective: Patient seen and examined at bedside.  Poor historian.  No fever, vomiting, worsening shortness of breath reported.   Objective: Vitals:   05/04/24 1153 05/04/24 1958 05/04/24 2009 05/05/24 0432  BP: 96/73 90/64  103/65  Pulse: 85 87  82  Resp: 16 16  20   Temp: 97.8 F (36.6 C) (!) 97.5 F (36.4 C)  97.7 F (36.5 C)  TempSrc: Oral Oral  Oral  SpO2: 100% 99% 98% 98%  Weight:      Height:        Intake/Output Summary (Last 24 hours) at 05/05/2024 0806 Last data filed at 05/04/2024 1100 Gross per 24 hour  Intake 240 ml  Output --  Net 240 ml   Filed Weights   04/19/24 1503 04/19/24 1813  Weight: 69.4 kg 69.2 kg    Examination:  General: No distress.  Intermittently requiring 2 L oxygen  management.  Chronically ill and deconditioned looking.  Extremely slow to respond and a poor historian with flat affect respiratory: Decreased breath sounds at bases bilaterally with basilar crackles  CVS: S1 S2 heard; currently rate controlled abdominal: Soft, nontender, distended slightly; no organomegaly; bowel sounds heard normally extremities: Lower extremity edema present bilaterally; no cyanosis   Data Reviewed: I have personally reviewed following labs and imaging studies  CBC: No results for input(s): "WBC", "NEUTROABS", "HGB", "HCT", "MCV", "PLT" in the last 168 hours. Basic Metabolic Panel: No results for input(s): "NA", "  K", "CL", "CO2", "GLUCOSE", "BUN", "CREATININE", "CALCIUM ", "MG", "PHOS" in the last 168 hours.  GFR: Estimated Creatinine Clearance: 55.3 mL/min (A) (by C-G formula based on SCr of 1.34 mg/dL (H)). Liver Function Tests: No results for input(s): "AST",  "ALT", "ALKPHOS", "BILITOT", "PROT", "ALBUMIN " in the last 168 hours. No results for input(s): "LIPASE", "AMYLASE" in the last 168 hours. No results for input(s): "AMMONIA" in the last 168 hours. Coagulation Profile: No results for input(s): "INR", "PROTIME" in the last 168 hours. Cardiac Enzymes: No results for input(s): "CKTOTAL", "CKMB", "CKMBINDEX", "TROPONINI" in the last 168 hours. BNP (last 3 results) No results for input(s): "PROBNP" in the last 8760 hours. HbA1C: No results for input(s): "HGBA1C" in the last 72 hours. CBG: No results for input(s): "GLUCAP" in the last 168 hours. Lipid Profile: No results for input(s): "CHOL", "HDL", "LDLCALC", "TRIG", "CHOLHDL", "LDLDIRECT" in the last 72 hours. Thyroid  Function Tests: No results for input(s): "TSH", "T4TOTAL", "FREET4", "T3FREE", "THYROIDAB" in the last 72 hours. Anemia Panel: No results for input(s): "VITAMINB12", "FOLATE", "FERRITIN", "TIBC", "IRON", "RETICCTPCT" in the last 72 hours. Sepsis Labs: No results for input(s): "PROCALCITON", "LATICACIDVEN" in the last 168 hours.  No results found for this or any previous visit (from the past 240 hours).       Radiology Studies: No results found.      Scheduled Meds:  albuterol   2.5 mg Nebulization BID   amiodarone   200 mg Oral Daily   aspirin  EC  81 mg Oral q AM   bictegravir-emtricitabine -tenofovir  AF  1 tablet Oral Daily   enoxaparin  (LOVENOX ) injection  40 mg Subcutaneous Q24H   feeding supplement  237 mL Oral BID BM   finasteride   5 mg Oral Daily   melatonin  10 mg Oral QHS   mexiletine  250 mg Oral Q12H   midodrine   30 mg Oral TID WC   mometasone -formoterol   2 puff Inhalation BID   potassium chloride   20 mEq Oral Daily   senna  2 tablet Oral Daily   sodium chloride  flush  3 mL Intravenous Q12H   tamsulosin   0.8 mg Oral Daily   torsemide   20 mg Oral BID   valACYclovir   500 mg Oral Daily   Continuous Infusions:        Audria Leather, MD [facility] 05/05/2024, 8:06 AM

## 2024-05-05 NOTE — Plan of Care (Signed)

## 2024-05-06 DIAGNOSIS — E876 Hypokalemia: Secondary | ICD-10-CM | POA: Diagnosis not present

## 2024-05-06 NOTE — Care Management Important Message (Signed)
 Important Message  Patient Details IM Letter given. Name: Ryan Long MRN: [id number] Date of Birth: [birth year]   Important Message Given:        [name] 05/06/2024, 2:38 PM

## 2024-05-06 NOTE — Progress Notes (Signed)
 PROGRESS NOTE    Ryan Long  VWU:[id number] DOB: 1961-05-29 DOA: 04/19/2024 PCP: Tretha Fu, MD   Brief Narrative:  63 y.o. male with past medical history significant for chronic combined/diastolic CHF/end-stage cardiomyopathy status post AICD (LVEF 20-25% with G1DD ), NSVT, HIV (CD4 count 429, undetectable VL 04/14/2024) , hypertension, hyperlipidemia, COPD, anxiety, depression, bipolar disorder, substance abuse, BPH, CKD stage IIIa (Cr 1.5 - 1.6) currently on home hospice presented with chest pain, shortness of breath, dizziness and headache.  He was admitted for worsening CHF and has had a long hospitalization.  Currently on oral diuretics.  Palliative care evaluated the patient.  Currently awaiting discharge to SNF with hospice.  Assessment & Plan:   End-stage chronic systolic heart failure Goals of care Hypotension -Patient has end-stage chronic systolic heart failure and was at home with hospice.  Prognosis is very poor and ICD was set off earlier this year. - Palliative care following.  Currently awaiting discharge to SNF with hospice.  TOC following - Continue torsemide  and midodrine   Chest pain - Possibly from above.  Troponins were within normal limits.  No further workup needed.  Continue supportive care and as needed nitroglycerin  and morphine   Hypokalemia - Resolved.  No recent labs.    BPH -Continue finasteride  and tamsulosin   HIV -Continue Biktarvy .  Continue Valtrex  due to history of recurrent HSV.  Outpatient follow-up with ID as needed  COPD -Stable.  Continue current inhaled regimen and nebs  CKD stage IIIa - Creatinine currently stable.  Will not order further labs for now as patient is awaiting discharge to SNF with hospice.  History of ventricular tachycardia - History of admission for VT storm earlier in the ER.  ICD now turned off. Continue mexiletine, amiodarone   Weakness/debility/deconditioning -Patient waiting to be discharged to SNF  with hospice.  TOC following.   DVT prophylaxis: Lovenox  Code Status: DNR Family Communication: None at bedside Disposition Plan: Status is: Inpatient Remains inpatient appropriate because: Of severity of illness  Consultants: Palliative care  Procedures: None  Antimicrobials: None   Subjective: Patient seen and examined at bedside.  Poor historian.  Denies worsening shortness of breath, chest pain or vomiting.   Objective: Vitals:   05/05/24 1322 05/05/24 2000 05/05/24 2115 05/06/24 0407  BP: 97/73  99/77 92/73  Pulse: 94  79 91  Resp: 18   18  Temp: (!) 97.5 F (36.4 C)  98 F (36.7 C) 98.2 F (36.8 C)  TempSrc: Oral  Oral Oral  SpO2: 100% 97% 99% 100%  Weight:      Height:        Intake/Output Summary (Last 24 hours) at 05/06/2024 0835 Last data filed at 05/05/2024 0930 Gross per 24 hour  Intake 120 ml  Output --  Net 120 ml   Filed Weights   04/19/24 1503 04/19/24 1813  Weight: 69.4 kg 69.2 kg    Examination:  General: Currently on room air.  No acute distress.  Chronically ill and deconditioned looking.  Extremely slow to respond and a poor historian with flat affect respiratory: Bilateral decreased breath sounds at bases with some crackles  CVS: Rate mostly controlled; S1 and S2 are heard  abdominal: Soft, nontender, remains slightly distended; no organomegaly; bowel sounds are normally heard  extremities: No clubbing; mild lower extremity edema present  Data Reviewed: I have personally reviewed following labs and imaging studies  CBC: No results for input(s): "WBC", "NEUTROABS", "HGB", "HCT", "MCV", "PLT" in the last 168 hours. Basic Metabolic Panel:  No results for input(s): "NA", "K", "CL", "CO2", "GLUCOSE", "BUN", "CREATININE", "CALCIUM ", "MG", "PHOS" in the last 168 hours.  GFR: Estimated Creatinine Clearance: 55.3 mL/min (A) (by C-G formula based on SCr of 1.34 mg/dL (H)). Liver Function Tests: No results for input(s): "AST", "ALT", "ALKPHOS",  "BILITOT", "PROT", "ALBUMIN " in the last 168 hours. No results for input(s): "LIPASE", "AMYLASE" in the last 168 hours. No results for input(s): "AMMONIA" in the last 168 hours. Coagulation Profile: No results for input(s): "INR", "PROTIME" in the last 168 hours. Cardiac Enzymes: No results for input(s): "CKTOTAL", "CKMB", "CKMBINDEX", "TROPONINI" in the last 168 hours. BNP (last 3 results) No results for input(s): "PROBNP" in the last 8760 hours. HbA1C: No results for input(s): "HGBA1C" in the last 72 hours. CBG: No results for input(s): "GLUCAP" in the last 168 hours. Lipid Profile: No results for input(s): "CHOL", "HDL", "LDLCALC", "TRIG", "CHOLHDL", "LDLDIRECT" in the last 72 hours. Thyroid  Function Tests: No results for input(s): "TSH", "T4TOTAL", "FREET4", "T3FREE", "THYROIDAB" in the last 72 hours. Anemia Panel: No results for input(s): "VITAMINB12", "FOLATE", "FERRITIN", "TIBC", "IRON", "RETICCTPCT" in the last 72 hours. Sepsis Labs: No results for input(s): "PROCALCITON", "LATICACIDVEN" in the last 168 hours.  No results found for this or any previous visit (from the past 240 hours).       Radiology Studies: No results found.      Scheduled Meds:  albuterol   2.5 mg Nebulization BID   amiodarone   200 mg Oral Daily   aspirin  EC  81 mg Oral q AM   bictegravir-emtricitabine -tenofovir  AF  1 tablet Oral Daily   enoxaparin  (LOVENOX ) injection  40 mg Subcutaneous Q24H   feeding supplement  237 mL Oral BID BM   finasteride   5 mg Oral Daily   melatonin  10 mg Oral QHS   mexiletine  250 mg Oral Q12H   midodrine   30 mg Oral TID WC   mometasone -formoterol   2 puff Inhalation BID   potassium chloride   20 mEq Oral Daily   senna  2 tablet Oral Daily   sodium chloride  flush  3 mL Intravenous Q12H   tamsulosin   0.8 mg Oral Daily   torsemide   20 mg Oral BID   valACYclovir   500 mg Oral Daily   Continuous Infusions:        Audria Leather, MD [facility] 05/06/2024, 8:35 AM

## 2024-05-07 DIAGNOSIS — I959 Hypotension, unspecified: Secondary | ICD-10-CM | POA: Diagnosis not present

## 2024-05-07 DIAGNOSIS — I5022 Chronic systolic (congestive) heart failure: Secondary | ICD-10-CM | POA: Diagnosis not present

## 2024-05-07 DIAGNOSIS — E876 Hypokalemia: Secondary | ICD-10-CM | POA: Diagnosis not present

## 2024-05-07 DIAGNOSIS — B20 Human immunodeficiency virus [HIV] disease: Secondary | ICD-10-CM

## 2024-05-07 DIAGNOSIS — N401 Enlarged prostate with lower urinary tract symptoms: Secondary | ICD-10-CM

## 2024-05-07 DIAGNOSIS — N1831 Chronic kidney disease, stage 3a: Secondary | ICD-10-CM

## 2024-05-07 DIAGNOSIS — I472 Ventricular tachycardia, unspecified: Secondary | ICD-10-CM

## 2024-05-07 DIAGNOSIS — R3911 Hesitancy of micturition: Secondary | ICD-10-CM

## 2024-05-07 DIAGNOSIS — R5381 Other malaise: Secondary | ICD-10-CM

## 2024-05-07 DIAGNOSIS — J449 Chronic obstructive pulmonary disease, unspecified: Secondary | ICD-10-CM

## 2024-05-07 DIAGNOSIS — R079 Chest pain, unspecified: Secondary | ICD-10-CM | POA: Diagnosis not present

## 2024-05-07 MED ORDER — LORAZEPAM 0.5 MG PO TABS
0.5000 mg | ORAL_TABLET | Freq: Once | ORAL | Status: AC
  Administered 2024-05-07: 0.5 mg via ORAL
  Filled 2024-05-07: qty 1

## 2024-05-07 MED ORDER — PANTOPRAZOLE SODIUM 40 MG PO TBEC
40.0000 mg | DELAYED_RELEASE_TABLET | Freq: Every day | ORAL | Status: DC
  Administered 2024-05-07 – 2024-05-09 (×2): 40 mg via ORAL
  Filled 2024-05-07 (×2): qty 1

## 2024-05-07 NOTE — Progress Notes (Signed)
 Took over pt care See [facility] for pain management No acute changes

## 2024-05-07 NOTE — Progress Notes (Signed)
 PROGRESS NOTE    Ryan Long  QIO:[id number] DOB: [birth year] DOA: 04/19/2024 PCP: Tretha Fu, MD    Chief Complaint  Patient presents with   Chest Pain    Brief Narrative:  63 y.o. male with past medical history significant for chronic combined/diastolic CHF/end-stage cardiomyopathy status post AICD (LVEF 20-25% with G1DD ), NSVT, HIV (CD4 count 429, undetectable VL 04/14/2024) , hypertension, hyperlipidemia, COPD, anxiety, depression, bipolar disorder, substance abuse, BPH, CKD stage IIIa (Cr 1.5 - 1.6) currently on home hospice presented with chest pain, shortness of breath, dizziness and headache. He was admitted for worsening CHF and has had a long hospitalization. Currently on oral diuretics. Palliative care evaluated the patient. Currently awaiting discharge to SNF with hospice.    Assessment & Plan:   Principal Problem:   Hypokalemia Active Problems:   End-stage systolic heart failure, chronic (HCC)   Chest pain   Hypotension   Paroxysmal atrial fibrillation (HCC)   HIV disease (HCC)   COPD (chronic obstructive pulmonary disease) (HCC)   Chronic kidney disease, stage 3a (HCC)   BPH (benign prostatic hyperplasia)   Debility   Goals of care, counseling/discussion   Ventricular tachycardia (HCC)   Malnutrition of moderate degree   Acute CHF (HCC)  #1 end-stage chronic systolic heart failure/hypotension -Patient noted with end-stage chronic systolic heart failure was at home with hospice with poor prognosis and ICD set up early on this year. - Patient with poor prognosis seen by palliative care. - Continue current regimen of torsemide  and midodrine . - Continue morphine  as needed, Ativan  as needed. - Awaiting discharge to SNF with hospice. - Palliative care following.  2.  Chest pain -Likely secondary to problem #1. - Troponins noted within normal limits. - No further workup needed. - Continue as needed nitroglycerin , morphine  as needed, Ativan  as  needed. - Placed on a PPI.  3.  Hypokalemia -Repleted. - No recent labs.  4.  BPH -Continue finasteride  and tamsulosin .  5.  HIV -Continue Biktarvy . - Continue Valtrex  due to history of recurrent HSV. - Outpatient follow-up with ID.  6.  COPD -Stable. - Continue Dulera  and nebs as needed.  7.  CKD 3A -Stable.  8.  History of ventricular tachycardia -Noted with a history of VT storm earlier in the ED. - ICD has been turned off. - Continue mexiletine and amiodarone .  9.  Weakness/debility/deconditioning -Seen by PT OT who are recommending SNF. - Awaiting SNF placement with hospice following.   DVT prophylaxis: Lovenox  Code Status: DNR Family Communication: Updated patient.  No family at bedside. Disposition: SNF with hospice following.  Status is: Inpatient Remains inpatient appropriate because: Unsafe disposition   Consultants:  Palliative care: [name] 04/21/2024  Procedures:  Chest x-ray 04/19/2024   Antimicrobials: Anti-infectives (From admission, onward)    Start     Dose/Rate Route Frequency Ordered Stop   04/20/24 1000  bictegravir-emtricitabine -tenofovir  AF (BIKTARVY ) 50-200-25 MG per tablet 1 tablet        1 tablet Oral Daily 04/19/24 2243     04/20/24 1000  valACYclovir  (VALTREX ) tablet 500 mg        500 mg Oral Daily 04/19/24 2243           Subjective: Patient sitting up in recliner.  Complains of feeling anxious and some chest discomfort.  Denies any significant shortness of breath.  No abdominal pain.  Objective: Vitals:   05/07/24 0502 05/07/24 0854 05/07/24 1307 05/07/24 1334  BP: 101/68  (!) 85/65 111/76  Pulse: 75  86   Resp: 18  17   Temp: 97.7 F (36.5 C)  (!) 97.5 F (36.4 C)   TempSrc: Oral     SpO2: 99% 97% 100%   Weight:      Height:        Intake/Output Summary (Last 24 hours) at 05/07/2024 1349 Last data filed at 05/07/2024 0900 Gross per 24 hour  Intake 840 ml  Output --  Net 840 ml   Filed Weights   04/19/24  1503 04/19/24 1813  Weight: 69.4 kg 69.2 kg    Examination:  General exam: Appears calm and comfortable  Respiratory system: Clear to auscultation. Respiratory effort normal. Cardiovascular system: S1 & S2 heard, RRR. No JVD, murmurs, rubs, gallops or clicks. No pedal edema. Gastrointestinal system: Abdomen is nondistended, soft and nontender. No organomegaly or masses felt. Normal bowel sounds heard. Central nervous system: Alert and oriented. No focal neurological deficits. Extremities: Symmetric 5 x 5 power. Skin: No rashes, lesions or ulcers Psychiatry: Judgement and insight appear normal. Mood & affect appropriate.     Data Reviewed: I have personally reviewed following labs and imaging studies  CBC: No results for input(s): "WBC", "NEUTROABS", "HGB", "HCT", "MCV", "PLT" in the last 168 hours.  Basic Metabolic Panel: No results for input(s): "NA", "K", "CL", "CO2", "GLUCOSE", "BUN", "CREATININE", "CALCIUM ", "MG", "PHOS" in the last 168 hours.  GFR: Estimated Creatinine Clearance: 55.3 mL/min (A) (by C-G formula based on SCr of 1.34 mg/dL (H)).  Liver Function Tests: No results for input(s): "AST", "ALT", "ALKPHOS", "BILITOT", "PROT", "ALBUMIN " in the last 168 hours.  CBG: No results for input(s): "GLUCAP" in the last 168 hours.   No results found for this or any previous visit (from the past 240 hours).       Radiology Studies: No results found.      Scheduled Meds:  amiodarone   200 mg Oral Daily   aspirin  EC  81 mg Oral q AM   bictegravir-emtricitabine -tenofovir  AF  1 tablet Oral Daily   enoxaparin  (LOVENOX ) injection  40 mg Subcutaneous Q24H   feeding supplement  237 mL Oral BID BM   finasteride   5 mg Oral Daily   LORazepam   0.5 mg Oral Once   melatonin  10 mg Oral QHS   mexiletine  250 mg Oral Q12H   midodrine   30 mg Oral TID WC   mometasone -formoterol   2 puff Inhalation BID   potassium chloride   20 mEq Oral Daily   senna  2 tablet Oral Daily    sodium chloride  flush  3 mL Intravenous Q12H   tamsulosin   0.8 mg Oral Daily   torsemide   20 mg Oral BID   valACYclovir   500 mg Oral Daily   Continuous Infusions:   LOS: 14 days    Time spent: 35 minutes    Hilda Lovings, MD [facility]   To contact the attending provider between 7A-7P or the covering provider during after hours 7P-7A, please log into the web site [email or web address] and access using universal [place] password for that web site. If you do not have the password, please call the hospital operator.  05/07/2024, 1:49 PM

## 2024-05-08 DIAGNOSIS — R079 Chest pain, unspecified: Secondary | ICD-10-CM | POA: Diagnosis not present

## 2024-05-08 DIAGNOSIS — J449 Chronic obstructive pulmonary disease, unspecified: Secondary | ICD-10-CM | POA: Diagnosis not present

## 2024-05-08 DIAGNOSIS — I480 Paroxysmal atrial fibrillation: Secondary | ICD-10-CM | POA: Diagnosis not present

## 2024-05-08 DIAGNOSIS — E876 Hypokalemia: Secondary | ICD-10-CM | POA: Diagnosis not present

## 2024-05-08 LAB — BASIC METABOLIC PANEL WITH GFR
Anion gap: 12 (ref 5–15)
BUN: 20 mg/dL (ref 8–23)
CO2: 26 mmol/L (ref 22–32)
Calcium: 9.2 mg/dL (ref 8.9–10.3)
Chloride: 105 mmol/L (ref 98–111)
Creatinine, Ser: 1.45 mg/dL — ABNORMAL HIGH (ref 0.61–1.24)
GFR, Estimated: 54 mL/min — ABNORMAL LOW (ref >60)
Glucose, Bld: 126 mg/dL — ABNORMAL HIGH (ref 70–99)
Potassium: 3.2 mmol/L — ABNORMAL LOW (ref 3.5–5.1)
Sodium: 143 mmol/L (ref 135–145)

## 2024-05-08 LAB — MAGNESIUM: Magnesium: 1.7 mg/dL (ref 1.7–2.4)

## 2024-05-08 MED ORDER — POTASSIUM CHLORIDE CRYS ER 20 MEQ PO TBCR
40.0000 meq | EXTENDED_RELEASE_TABLET | ORAL | Status: AC
  Administered 2024-05-08 (×2): 40 meq via ORAL
  Filled 2024-05-08 (×2): qty 2

## 2024-05-08 MED ORDER — MAGNESIUM SULFATE 2 GM/50ML IV SOLN
2.0000 g | Freq: Once | INTRAVENOUS | Status: AC
  Administered 2024-05-08: 2 g via INTRAVENOUS
  Filled 2024-05-08: qty 50

## 2024-05-08 MED ORDER — POTASSIUM CHLORIDE 20 MEQ PO PACK: 40.0000 meq | PACK | Freq: Every day | ORAL | Status: DC

## 2024-05-08 NOTE — TOC Progression Note (Signed)
 Transition of Care [facility]) - Progression Note    Patient Details  Name: Ryan Long MRN: [id number] Date of Birth: 1961-01-29  Transition of [facility]) CM/SW Contact  Gertha Ku, LCSW Phone Number: 05/08/2024, 8:43 AM  Clinical Narrative:     CSW reached out to Lauren admission with  [facility] rehab, she stated they do not have LTC beds available at this time.   CSW spoke with Atlee Leach with [facility], she reports her DON is still reviewing pt for possible placement.   CSW sent pt's information out to [facility] rehab for review for possible placement.   TOC leadership made aware.        Expected Discharge Plan: Home w Hospice Care Barriers to Discharge: Barriers Resolved  Expected Discharge Plan and Services       Living arrangements for the past 2 months: Single Family Home Expected Discharge Date: 04/28/24                                     Social Determinants of Health (SDOH) Interventions SDOH Screenings   Food Insecurity: No Food Insecurity (04/19/2024)  Housing: Low Risk  (04/19/2024)  Transportation Needs: No Transportation Needs (04/19/2024)  Utilities: Not At Risk (04/19/2024)  Alcohol  Screen: Low Risk  (07/17/2023)  Depression (PHQ2-9): Low Risk  (08/01/2023)  Financial Resource Strain: Low Risk  (07/17/2023)  Physical Activity: Insufficiently Active (07/17/2023)  Social Connections: Socially Isolated (04/19/2024)  Stress: Stress Concern Present (07/17/2023)  Tobacco Use: High Risk (04/19/2024)  Health Literacy: Adequate Health Literacy (07/18/2023)    Readmission Risk Interventions    03/19/2024    3:54 PM  Readmission Risk Prevention Plan  Transportation Screening Complete  PCP or Specialist Appt within 3-5 Days Complete  HRI or Home Care Consult Complete  Medication Review (RN Care Manager) Complete

## 2024-05-08 NOTE — Progress Notes (Signed)
     [facility] Liaison Note   [facility] continues to follow for discharge disposition for hospice services at LTC.  Currently awaiting bed placement.   Please call with any hospice related questions or concerns        Henderson Newcomer, [facility] Liaison [phone]

## 2024-05-08 NOTE — Progress Notes (Signed)
 PROGRESS NOTE    Ryan Long  ZOX:[id number] DOB: January 06, 1961 DOA: 04/19/2024 PCP: Tretha Fu, MD    Chief Complaint  Patient presents with   Chest Pain    Brief Narrative:  63 y.o. male with past medical history significant for chronic combined/diastolic CHF/end-stage cardiomyopathy status post AICD (LVEF 20-25% with G1DD ), NSVT, HIV (CD4 count 429, undetectable VL 04/14/2024) , hypertension, hyperlipidemia, COPD, anxiety, depression, bipolar disorder, substance abuse, BPH, CKD stage IIIa (Cr 1.5 - 1.6) currently on home hospice presented with chest pain, shortness of breath, dizziness and headache. He was admitted for worsening CHF and has had a long hospitalization. Currently on oral diuretics. Palliative care evaluated the patient. Currently awaiting discharge to SNF with hospice.    Assessment & Plan:   Principal Problem:   Hypokalemia Active Problems:   End-stage systolic heart failure, chronic (HCC)   Chest pain   Hypotension   Paroxysmal atrial fibrillation (HCC)   HIV disease (HCC)   COPD (chronic obstructive pulmonary disease) (HCC)   Chronic kidney disease, stage 3a (HCC)   BPH (benign prostatic hyperplasia)   Debility   Goals of care, counseling/discussion   Ventricular tachycardia (HCC)   Malnutrition of moderate degree   Acute CHF (HCC)  #1 end-stage chronic systolic heart failure/hypotension -Patient noted with end-stage chronic systolic heart failure was at home with hospice with poor prognosis and ICD set up early on this year. - Patient with poor prognosis seen by palliative care. - Continue current regimen of torsemide  and midodrine . - Continue morphine  as needed, Ativan  as needed. - Awaiting discharge to SNF with hospice. - Palliative care following.  2.  Chest pain -Likely secondary to problem #1. - Troponins noted within normal limits. - No further workup needed. - Continue as needed nitroglycerin , morphine  as needed, Ativan  as  needed. - PPI.  3.  Hypokalemia - Potassium at 3.2 this morning.   - K-Dur 40 mEq p.o. every 4 hours x 2 doses.   - Increase oral potassium supplementation to 40 mEq daily.  -Repeat labs in the AM.  4.  BPH - Finasteride , tamsulosin .  5.  HIV -Continue Biktarvy . - Continue Valtrex  due to history of recurrent HSV. - Outpatient follow-up with ID.  6.  COPD -Stable. - Continue Dulera  and nebs as needed.  7.  CKD 3A -Stable.  8.  History of ventricular tachycardia -Noted with a history of VT storm earlier in the ED. - ICD has been turned off. -Potassium at 3.2 and magnesium  at 1.7. -Will give magnesium  sulfate 2 g IV x 1. -Replete potassium. -Increase daily potassium supplementation to 40 mEq daily. - Continue mexiletine and amiodarone .  9.  Weakness/debility/deconditioning -Seen by PT OT who are recommending SNF. - Awaiting SNF placement with hospice following.   DVT prophylaxis: Lovenox  Code Status: DNR Family Communication: Updated patient.  No family at bedside. Disposition: SNF with hospice following.  Status is: Inpatient Remains inpatient appropriate because: Unsafe disposition   Consultants:  Palliative care: [name] 04/21/2024  Procedures:  Chest x-ray 04/19/2024   Antimicrobials: Anti-infectives (From admission, onward)    Start     Dose/Rate Route Frequency Ordered Stop   04/20/24 1000  bictegravir-emtricitabine -tenofovir  AF (BIKTARVY ) 50-200-25 MG per tablet 1 tablet        1 tablet Oral Daily 04/19/24 2243     04/20/24 1000  valACYclovir  (VALTREX ) tablet 500 mg        500 mg Oral Daily 04/19/24 2243  Subjective: Sitting up in recliner.  Less anxious today.  States some improvement with chest discomfort.  No significant shortness of breath.  Tolerating current diet.   Objective: Vitals:   05/07/24 2136 05/08/24 0353 05/08/24 0758 05/08/24 1238  BP: 95/72 106/85  94/63  Pulse: 82 81  78  Resp: 20 18  20   Temp: (!) 97.5 F (36.4  C) 97.6 F (36.4 C)  97.6 F (36.4 C)  TempSrc: Oral Oral  Oral  SpO2: 100% 100% 100% 100%  Weight:      Height:       No intake or output data in the 24 hours ending 05/08/24 1248  Filed Weights   04/19/24 1503 04/19/24 1813  Weight: 69.4 kg 69.2 kg    Examination:  General exam: NAD Respiratory system: CTAB.  No wheezes, no crackles, no rhonchi.  Fair air movement.  Speaking in full sentences.  Cardiovascular system: RRR no murmurs rubs or gallops.  No JVD.  No pitting lower extremity edema.  Gastrointestinal system: Abdomen is soft, nontender, nondistended, positive bowel sounds.  No rebound.  No guarding.  Central nervous system: Alert and oriented. No focal neurological deficits. Extremities: Symmetric 5 x 5 power. Skin: No rashes, lesions or ulcers Psychiatry: Judgement and insight appear normal. Mood & affect appropriate.     Data Reviewed: I have personally reviewed following labs and imaging studies  CBC: No results for input(s): "WBC", "NEUTROABS", "HGB", "HCT", "MCV", "PLT" in the last 168 hours.  Basic Metabolic Panel: Recent Labs  Lab 05/08/24 1046  NA 143  K 3.2*  CL 105  CO2 26  GLUCOSE 126*  BUN 20  CREATININE 1.45*  CALCIUM  9.2  MG 1.7    GFR: Estimated Creatinine Clearance: 51.1 mL/min (A) (by C-G formula based on SCr of 1.45 mg/dL (H)).  Liver Function Tests: No results for input(s): "AST", "ALT", "ALKPHOS", "BILITOT", "PROT", "ALBUMIN " in the last 168 hours.  CBG: No results for input(s): "GLUCAP" in the last 168 hours.   No results found for this or any previous visit (from the past 240 hours).       Radiology Studies: No results found.      Scheduled Meds:  amiodarone   200 mg Oral Daily   aspirin  EC  81 mg Oral q AM   bictegravir-emtricitabine -tenofovir  AF  1 tablet Oral Daily   enoxaparin  (LOVENOX ) injection  40 mg Subcutaneous Q24H   feeding supplement  237 mL Oral BID BM   finasteride   5 mg Oral Daily   melatonin   10 mg Oral QHS   mexiletine  250 mg Oral Q12H   midodrine   30 mg Oral TID WC   mometasone -formoterol   2 puff Inhalation BID   pantoprazole  40 mg Oral Q0600   potassium chloride   20 mEq Oral Daily   senna  2 tablet Oral Daily   sodium chloride  flush  3 mL Intravenous Q12H   tamsulosin   0.8 mg Oral Daily   torsemide   20 mg Oral BID   valACYclovir   500 mg Oral Daily   Continuous Infusions:   LOS: 15 days    Time spent: 35 minutes    Hilda Lovings, MD [facility]   To contact the attending provider between 7A-7P or the covering provider during after hours 7P-7A, please log into the web site [email or web address] and access using universal [place] password for that web site. If you do not have the password, please call the hospital operator.  05/08/2024, 12:48 PM

## 2024-05-08 NOTE — Plan of Care (Incomplete)
  Problem: Clinical Measurements: Goal: Diagnostic test results will improve Outcome: Progressing Goal: Cardiovascular complication will be avoided Outcome: Progressing   Problem: Nutrition: Goal: Adequate nutrition will be maintained Outcome: Progressing   Problem: Coping: Goal: Level of anxiety will decrease Outcome: Progressing   Problem: Pain Managment: Goal: General experience of comfort will improve and/or be controlled Outcome: Progressing   Problem: Health Behavior/Discharge Planning: Goal: Ability to manage health-related needs will improve Outcome: Adequate for Discharge   Problem: Clinical Measurements: Goal: Ability to maintain clinical measurements within normal limits will improve Outcome: Adequate for Discharge Goal: Will remain free from infection Outcome: Adequate for Discharge Goal: Respiratory complications will improve Outcome: Adequate for Discharge   Problem: Activity: Goal: Risk for activity intolerance will decrease Outcome: Adequate for Discharge   Problem: Elimination: Goal: Will not experience complications related to bowel motility Outcome: Adequate for Discharge Goal: Will not experience complications related to urinary retention Outcome: Adequate for Discharge   Problem: Safety: Goal: Ability to remain free from injury will improve Outcome: Adequate for Discharge   Problem: Skin Integrity: Goal: Risk for impaired skin integrity will decrease Outcome: Adequate for Discharge

## 2024-05-09 DIAGNOSIS — R079 Chest pain, unspecified: Secondary | ICD-10-CM

## 2024-05-09 DIAGNOSIS — R5381 Other malaise: Secondary | ICD-10-CM

## 2024-05-09 DIAGNOSIS — I959 Hypotension, unspecified: Secondary | ICD-10-CM

## 2024-05-09 DIAGNOSIS — N1831 Chronic kidney disease, stage 3a: Secondary | ICD-10-CM

## 2024-05-09 DIAGNOSIS — I480 Paroxysmal atrial fibrillation: Secondary | ICD-10-CM | POA: Diagnosis not present

## 2024-05-09 DIAGNOSIS — I472 Ventricular tachycardia, unspecified: Secondary | ICD-10-CM

## 2024-05-09 DIAGNOSIS — B20 Human immunodeficiency virus [HIV] disease: Secondary | ICD-10-CM

## 2024-05-09 DIAGNOSIS — E876 Hypokalemia: Secondary | ICD-10-CM | POA: Diagnosis not present

## 2024-05-09 DIAGNOSIS — I5022 Chronic systolic (congestive) heart failure: Secondary | ICD-10-CM

## 2024-05-09 DIAGNOSIS — N401 Enlarged prostate with lower urinary tract symptoms: Secondary | ICD-10-CM

## 2024-05-09 DIAGNOSIS — R3911 Hesitancy of micturition: Secondary | ICD-10-CM

## 2024-05-09 LAB — BASIC METABOLIC PANEL WITH GFR
Anion gap: 9 (ref 5–15)
BUN: 24 mg/dL — ABNORMAL HIGH (ref 8–23)
CO2: 25 mmol/L (ref 22–32)
Calcium: 8.8 mg/dL — ABNORMAL LOW (ref 8.9–10.3)
Chloride: 106 mmol/L (ref 98–111)
Creatinine, Ser: 1.06 mg/dL (ref 0.61–1.24)
GFR, Estimated: >60 mL/min (ref >60)
Glucose, Bld: 103 mg/dL — ABNORMAL HIGH (ref 70–99)
Potassium: 3.5 mmol/L (ref 3.5–5.1)
Sodium: 140 mmol/L (ref 135–145)

## 2024-05-09 LAB — MAGNESIUM: Magnesium: 2.1 mg/dL (ref 1.7–2.4)

## 2024-05-09 MED ORDER — LORAZEPAM 0.5 MG PO TABS
0.5000 mg | ORAL_TABLET | Freq: Once | ORAL | Status: AC
  Administered 2024-05-09: 0.5 mg via ORAL

## 2024-05-09 MED ORDER — POTASSIUM CHLORIDE CRYS ER 20 MEQ PO TBCR: 40.0000 meq | EXTENDED_RELEASE_TABLET | Freq: Every day | ORAL | Status: DC

## 2024-05-09 MED ORDER — LORAZEPAM 1 MG PO TABS: 1.0000 mg | ORAL_TABLET | Freq: Three times a day (TID) | ORAL | 0 refills | Status: DC | PRN

## 2024-05-09 MED ORDER — PANTOPRAZOLE SODIUM 40 MG PO TBEC: 40.0000 mg | DELAYED_RELEASE_TABLET | Freq: Every day | ORAL | 0 refills | Status: DC

## 2024-05-09 MED ORDER — TORSEMIDE 20 MG PO TABS: 20.0000 mg | ORAL_TABLET | Freq: Two times a day (BID) | ORAL | Status: DC

## 2024-05-09 MED ORDER — POTASSIUM CHLORIDE CRYS ER 20 MEQ PO TBCR
40.0000 meq | EXTENDED_RELEASE_TABLET | Freq: Every day | ORAL | Status: DC
  Administered 2024-05-09: 40 meq via ORAL
  Filled 2024-05-09: qty 2

## 2024-05-09 MED ORDER — LORAZEPAM 1 MG PO TABS: 1.0000 mg | ORAL_TABLET | ORAL | Status: DC | PRN

## 2024-05-09 NOTE — Discharge Summary (Signed)
 Physician Discharge Summary  Ryan Long ZOX:[id number] DOB: September 30, 1961 DOA: 04/19/2024  PCP: Tretha Fu, MD  Admit date: 04/19/2024 Discharge date: 05/09/2024  Time spent: 60 minutes  Recommendations for Outpatient Follow-up:  Patient will be discharge to skilled nursing facility with hospice following. Lennart Quitter) Follow-up with MD at SNF.  Patient will need a basic metabolic profile and magnesium  level checked in 1 week. Follow-up with Dr. Ernie Heal, ID in 1 month   Discharge Diagnoses:  Principal Problem:   Hypokalemia Active Problems:   End-stage systolic heart failure, chronic (HCC)   Chest pain   Hypotension   Paroxysmal atrial fibrillation (HCC)   HIV disease (HCC)   COPD (chronic obstructive pulmonary disease) (HCC)   Chronic kidney disease, stage 3a (HCC)   BPH (benign prostatic hyperplasia)   Debility   Goals of care, counseling/discussion   Ventricular tachycardia (HCC)   Malnutrition of moderate degree   Acute CHF (HCC)   Discharge Condition: Stable  Diet recommendation: Heart healthy  Filed Weights   04/19/24 1503 04/19/24 1813  Weight: 69.4 kg 69.2 kg    History of present illness:  HPI per Dr. Bryant Capron Ryan Long is a 63 y.o. male with medical history significant of end-stage heart failure, hypertension, hyperlipidemia, HIV, COPD, CKD, anxiety, depression, and bipolar disorder presents with chest pain and headaches.   Records note patient was last hospitalized 3/31-4/6 for acute on chronic systolic heart failure exacerbation.  Record noted that during hospitalization patient was not a candidate for advanced therapies and discharged home with home hospice.   He has been experiencing chest pain and headaches for a couple of days, accompanied by frequent nausea without vomiting and anxiety. He denies swelling but reports shortness of breath, lightheadedness, and dizziness. He has experienced significant weight loss of about forty  pounds.   He has a history of low potassium and magnesium  levels, for which he is currently receiving treatment. He questions the lack of prescribed supplements upon discharge, as he has had recurrent low levels. He uses oxygen  at home and has several tanks available.   He lives alone and recently quit smoking about a month ago. He does not consume alcohol .  Patient does note that he has had difficulties in caring for himself at home as he lives alone.   With EMS patient had been given 325 mg of aspirin  and 1 nitroglycerin .   In the emergency department patient was noted to be afebrile with respirations 18-23, blood pressures 83/68 at 94/66, and O2 saturations maintained on room air.  Labs significant for WBC 2.8, platelets 144, potassium 2.4, BUN 19, creatinine 1.55,  calcium  8, and high-sensitivity troponins negative x 2.  Patient had been ordered 2 g of magnesium  sulfate, morphine  4 mg IV, and potassium chloride  40 meq IV.  Hospital Course:  #1 end-stage chronic systolic heart failure/hypotension -Patient noted with end-stage chronic systolic heart failure was at home with hospice with poor prognosis and ICD set up early on this year. - Patient with poor prognosis seen by palliative care. - Patient maintained on torsemide  and midodrine . - Patient also placed on morphine  as needed, Ativan  as needed. - Patient be discharged to SNF with hospice [facility]) following.     2.  Chest pain -Likely secondary to problem #1. - Troponins noted within normal limits. - No further workup needed. - Patient maintained on needed nitroglycerin , morphine  as needed, Ativan  as needed. - PPI.   3.  Hypokalemia - Repleted during the hospitalization.   -  Patient's oral daily potassium supplementation increased to 40 mEq daily.   - Will need repeat labs in 1 week.    4.  BPH - Patient maintained on home regimen finasteride , tamsulosin .   5.  HIV - Patient maintained on home regimen Biktarvy . -  Patient maintained on Valtrex  due to history of recurrent HSV. - Outpatient follow-up with ID.   6.  COPD -Stable. - Patient maintained on Dulera  and nebs as needed.   7.  CKD 3A -Stable.   8.  History of ventricular tachycardia -Noted with a history of VT storm earlier in the ED. - ICD has been turned off. - Electrolytes were repleted.   - Patient's daily potassium supplementation increased to 40 mEq daily.   - Patient maintained on home regimen potassium mexiletine and amiodarone .   9.  Weakness/debility/deconditioning -Seen by PT OT who are recommending SNF. - Patient will be discharged to SNF with hospice [facility]) following.    Procedures: Chest x-ray 04/19/2024   Consultations: Palliative care: [name] 04/21/2024   Discharge Exam: Vitals:   05/09/24 0841 05/09/24 1244  BP:  97/73  Pulse:  99  Resp:  20  Temp:  (!) 97.5 F (36.4 C)  SpO2: 98% 100%    General: NAD Cardiovascular: RRR no murmurs rubs or gallops.  No JVD.  No lower extremity edema. Respiratory: Clear to auscultation bilaterally.  No wheezes, no crackles, no rhonchi.  Fair air movement.  Speaking in full sentences.  Discharge Instructions   Discharge Instructions     Diet - low sodium heart healthy   Complete by: As directed    Discharge instructions   Complete by: As directed    Hospice to follow at SNF   Increase activity slowly   Complete by: As directed    Increase activity slowly   Complete by: As directed       Allergies as of 05/09/2024   No Known Allergies      Medication List     STOP taking these medications    oxyCODONE  5 MG immediate release tablet Commonly known as: Oxy IR/ROXICODONE    triamcinolone  ointment 0.5 % Commonly known as: KENALOG        TAKE these medications    acetaminophen  325 MG tablet Commonly known as: TYLENOL  Take 2 tablets (650 mg total) by mouth every 6 (six) hours as needed for mild pain (pain score 1-3) (or Fever >/= 101).    albuterol  108 (90 Base) MCG/ACT inhaler Commonly known as: VENTOLIN  HFA Inhale 2 puffs into the lungs every 6 (six) hours as needed for wheezing or shortness of breath.   amiodarone  200 MG tablet Commonly known as: PACERONE  Take 1 tablet (200 mg total) by mouth daily.   aspirin  EC 81 MG tablet Take 81 mg by mouth in the morning. Swallow whole.   Biktarvy  50-200-25 MG Tabs tablet Generic drug: bictegravir-emtricitabine -tenofovir  AF Take 1 tablet by mouth daily. What changed: when to take this   finasteride  5 MG tablet Commonly known as: PROSCAR  TAKE 1 TABLET(5 MG) BY MOUTH DAILY What changed: See the new instructions.   LORazepam  1 MG tablet Commonly known as: ATIVAN  Take 1 tablet (1 mg total) by mouth every 8 (eight) hours as needed for anxiety. What changed:  medication strength how much to take when to take this reasons to take this   Melatonin 10 MG Tabs Take 10 mg by mouth at bedtime.   mexiletine 250 MG capsule Commonly known as: MEXITIL  Take 1 capsule (250  mg total) by mouth every 12 (twelve) hours.   midodrine  10 MG tablet Commonly known as: PROAMATINE  Take 3 tablets (30 mg total) by mouth 3 (three) times daily with meals. What changed:  medication strength how much to take when to take this   morphine  10 MG/5ML solution Take 1.3 mLs (2.6 mg total) by mouth every 6 (six) hours as needed for severe pain (pain score 7-10).   ondansetron  4 MG tablet Commonly known as: Zofran  Take 1 tablet (4 mg total) by mouth every 8 (eight) hours as needed for nausea or vomiting.   pantoprazole  40 MG tablet Commonly known as: PROTONIX  Take 1 tablet (40 mg total) by mouth daily at 6 (six) AM. Start taking on: May 10, 2024   polyethylene glycol 17 g packet Commonly known as: MIRALAX  / GLYCOLAX  Take 17 g by mouth daily.   potassium chloride  SA 20 MEQ tablet Commonly known as: KLOR-CON  M Take 2 tablets (40 mEq total) by mouth daily. Start taking on: May 10, 2024    senna 8.6 MG Tabs tablet Commonly known as: SENOKOT Take 2 tablets (17.2 mg total) by mouth daily as needed for mild constipation.   Symbicort 80-4.5 MCG/ACT inhaler Generic drug: budesonide-formoterol  Inhale 2 puffs into the lungs in the morning and at bedtime.   tamsulosin  0.4 MG Caps capsule Commonly known as: FLOMAX  Take 2 capsules (0.8 mg total) by mouth daily. What changed:  how much to take how to take this when to take this additional instructions   torsemide  20 MG tablet Commonly known as: DEMADEX  Take 1 tablet (20 mg total) by mouth 2 (two) times daily. What changed:  how much to take when to take this additional instructions   traZODone  150 MG tablet Commonly known as: DESYREL  Take 150 mg by mouth at bedtime as needed for sleep.   valACYclovir  500 MG tablet Commonly known as: VALTREX  Take 1 tablet (500 mg total) by mouth daily. What changed: when to take this       No Known Allergies  Follow-up Information     MD at SNF Follow up.          [facility] Follow up.   Specialty: Hospice and Palliative Medicine Why: Will follow at facility Contact information: [address] [place]  [address] [phone]        Ernie Heal, Jerelyn Money, MD Follow up in 1 month(s).   Specialty: Infectious Diseases Contact information: [address] [place] [address] [phone]                  The results of significant diagnostics from this hospitalization (including imaging, microbiology, ancillary and laboratory) are listed below for reference.    Significant Diagnostic Studies: DG Chest 2 View Result Date: 04/19/2024 CLINICAL DATA:  Chest pain. EXAM: CHEST - 2 VIEW COMPARISON:  Chest radiograph dated 03/26/2024. FINDINGS: No focal consolidation, pleural effusion, pneumothorax. Borderline cardiomegaly. Left pectoral pacemaker device. No acute osseous pathology. IMPRESSION: No active cardiopulmonary disease.  Electronically Signed   By: Angus Bark M.D.   On: 04/19/2024 15:53    Microbiology: No results found for this or any previous visit (from the past 240 hours).   Labs: Basic Metabolic Panel: Recent Labs  Lab 05/08/24 1046 05/09/24 0347  NA 143 140  K 3.2* 3.5  CL 105 106  CO2 26 25  GLUCOSE 126* 103*  BUN 20 24*  CREATININE 1.45* 1.06  CALCIUM  9.2 8.8*  MG 1.7 2.1  Liver Function Tests: No results for input(s): "AST", "ALT", "ALKPHOS", "BILITOT", "PROT", "ALBUMIN " in the last 168 hours. No results for input(s): "LIPASE", "AMYLASE" in the last 168 hours. No results for input(s): "AMMONIA" in the last 168 hours. CBC: No results for input(s): "WBC", "NEUTROABS", "HGB", "HCT", "MCV", "PLT" in the last 168 hours. Cardiac Enzymes: No results for input(s): "CKTOTAL", "CKMB", "CKMBINDEX", "TROPONINI" in the last 168 hours. BNP: BNP (last 3 results) Recent Labs    02/22/24 0455 03/17/24 2358 04/19/24 1606  BNP 1,651.0* 1,875.5* 1,490.0*    ProBNP (last 3 results) No results for input(s): "PROBNP" in the last 8760 hours.  CBG: No results for input(s): "GLUCAP" in the last 168 hours.     Signed:  Hilda Lovings MD.  [facility] 05/09/2024, 2:27 PM

## 2024-05-09 NOTE — TOC Transition Note (Signed)
 Transition of Care [facility]) - Discharge Note   Patient Details  Name: Ryan Long MRN: [id number] Date of Birth: 16-Dec-1961  [facility] CM/SW Contact:  Gertha Ku, LCSW Phone Number: 05/09/2024, 10:58 AM   Clinical Narrative:     CSW spoke with Margretta Shi, who stated their financial department has approved pt to admit. She reports pt will need 30 day LOG. LOG was approved.     Pt to d/c to [facility] via safe transport. Pt's room 412-C RN to call report to [phone]. Transport was scheduled for 5pm . No further TOC needs, TOC sign off.    Final next level of care: Long Term Nursing Home (with hospcie) Barriers to Discharge: Barriers Resolved   Patient Goals and CMS Choice Patient states their goals for this hospitalization and ongoing recovery are:: LTC facility          Discharge Placement                Patient to be transferred to facility by: EMS   Patient and family notified of of transfer: 04/28/24  Discharge Plan and Services Additional resources added to the After Visit Summary for                                       Social Drivers of Health (SDOH) Interventions SDOH Screenings   Food Insecurity: No Food Insecurity (04/19/2024)  Housing: Low Risk  (04/19/2024)  Transportation Needs: No Transportation Needs (04/19/2024)  Utilities: Not At Risk (04/19/2024)  Alcohol  Screen: Low Risk  (07/17/2023)  Depression (PHQ2-9): Low Risk  (08/01/2023)  Financial Resource Strain: Low Risk  (07/17/2023)  Physical Activity: Insufficiently Active (07/17/2023)  Social Connections: Socially Isolated (04/19/2024)  Stress: Stress Concern Present (07/17/2023)  Tobacco Use: High Risk (04/19/2024)  Health Literacy: Adequate Health Literacy (07/18/2023)     Readmission Risk Interventions    03/19/2024    3:54 PM  Readmission Risk Prevention Plan  Transportation Screening Complete  PCP or Specialist Appt within 3-5 Days Complete  HRI  or Home Care Consult Complete  Medication Review (RN Care Manager) Complete

## 2024-05-09 NOTE — Progress Notes (Addendum)
 Patient received discharge orders to go to [facility] (room 412-C). RN called report on patient to [facility]. Patient was given discharge paperwork/instructions and home medications. RN went over discharge paperwork/instructions with the patient. All questions/concerns were answered/addressed to the best of RN's ability. RN instructed patient to give paperwork to facility upon arrival. Patient transferred to [facility] facility via [employer]. Patient left the hospital stable, had discharge paperwork/instructions and home medications, and had all personal belongings.

## 2024-05-12 DIAGNOSIS — Z8679 Personal history of other diseases of the circulatory system: Secondary | ICD-10-CM | POA: Diagnosis not present

## 2024-05-12 DIAGNOSIS — J449 Chronic obstructive pulmonary disease, unspecified: Secondary | ICD-10-CM | POA: Diagnosis not present

## 2024-05-12 DIAGNOSIS — E876 Hypokalemia: Secondary | ICD-10-CM | POA: Diagnosis not present

## 2024-05-12 DIAGNOSIS — R5381 Other malaise: Secondary | ICD-10-CM | POA: Diagnosis not present

## 2024-05-12 DIAGNOSIS — N1831 Chronic kidney disease, stage 3a: Secondary | ICD-10-CM | POA: Diagnosis not present

## 2024-05-12 DIAGNOSIS — K5901 Slow transit constipation: Secondary | ICD-10-CM | POA: Diagnosis not present

## 2024-05-12 DIAGNOSIS — K219 Gastro-esophageal reflux disease without esophagitis: Secondary | ICD-10-CM | POA: Diagnosis not present

## 2024-05-12 DIAGNOSIS — G4700 Insomnia, unspecified: Secondary | ICD-10-CM | POA: Diagnosis not present

## 2024-05-12 DIAGNOSIS — I5022 Chronic systolic (congestive) heart failure: Secondary | ICD-10-CM | POA: Diagnosis not present

## 2024-05-22 ENCOUNTER — Other Ambulatory Visit: Payer: Self-pay

## 2024-05-22 ENCOUNTER — Emergency Department (HOSPITAL_COMMUNITY)

## 2024-05-22 ENCOUNTER — Inpatient Hospital Stay (HOSPITAL_COMMUNITY)
Admission: EM | Admit: 2024-05-22 | Discharge: 2024-06-17 | DRG: 291 | Disposition: E | Attending: Internal Medicine | Admitting: Internal Medicine

## 2024-05-22 DIAGNOSIS — E861 Hypovolemia: Secondary | ICD-10-CM | POA: Diagnosis present

## 2024-05-22 DIAGNOSIS — K810 Acute cholecystitis: Secondary | ICD-10-CM

## 2024-05-22 DIAGNOSIS — F4310 Post-traumatic stress disorder, unspecified: Secondary | ICD-10-CM | POA: Diagnosis present

## 2024-05-22 DIAGNOSIS — Z515 Encounter for palliative care: Secondary | ICD-10-CM | POA: Diagnosis not present

## 2024-05-22 DIAGNOSIS — D751 Secondary polycythemia: Secondary | ICD-10-CM

## 2024-05-22 DIAGNOSIS — J449 Chronic obstructive pulmonary disease, unspecified: Secondary | ICD-10-CM

## 2024-05-22 DIAGNOSIS — R7401 Elevation of levels of liver transaminase levels: Secondary | ICD-10-CM | POA: Diagnosis present

## 2024-05-22 DIAGNOSIS — E876 Hypokalemia: Secondary | ICD-10-CM | POA: Diagnosis present

## 2024-05-22 DIAGNOSIS — E875 Hyperkalemia: Secondary | ICD-10-CM | POA: Diagnosis not present

## 2024-05-22 DIAGNOSIS — I480 Paroxysmal atrial fibrillation: Secondary | ICD-10-CM

## 2024-05-22 DIAGNOSIS — R9431 Abnormal electrocardiogram [ECG] [EKG]: Secondary | ICD-10-CM

## 2024-05-22 DIAGNOSIS — J4489 Other specified chronic obstructive pulmonary disease: Secondary | ICD-10-CM | POA: Diagnosis present

## 2024-05-22 DIAGNOSIS — N1831 Chronic kidney disease, stage 3a: Secondary | ICD-10-CM | POA: Diagnosis present

## 2024-05-22 DIAGNOSIS — Z7985 Long-term (current) use of injectable non-insulin antidiabetic drugs: Secondary | ICD-10-CM

## 2024-05-22 DIAGNOSIS — Z66 Do not resuscitate: Secondary | ICD-10-CM | POA: Diagnosis not present

## 2024-05-22 DIAGNOSIS — Z79899 Other long term (current) drug therapy: Secondary | ICD-10-CM | POA: Diagnosis not present

## 2024-05-22 DIAGNOSIS — R338 Other retention of urine: Secondary | ICD-10-CM

## 2024-05-22 DIAGNOSIS — R079 Chest pain, unspecified: Secondary | ICD-10-CM

## 2024-05-22 DIAGNOSIS — I428 Other cardiomyopathies: Secondary | ICD-10-CM | POA: Diagnosis present

## 2024-05-22 DIAGNOSIS — R064 Hyperventilation: Secondary | ICD-10-CM | POA: Diagnosis not present

## 2024-05-22 DIAGNOSIS — F419 Anxiety disorder, unspecified: Secondary | ICD-10-CM | POA: Diagnosis present

## 2024-05-22 DIAGNOSIS — I5043 Acute on chronic combined systolic (congestive) and diastolic (congestive) heart failure: Secondary | ICD-10-CM | POA: Diagnosis present

## 2024-05-22 DIAGNOSIS — R000 Tachycardia, unspecified: Secondary | ICD-10-CM | POA: Diagnosis present

## 2024-05-22 DIAGNOSIS — E440 Moderate protein-calorie malnutrition: Secondary | ICD-10-CM | POA: Diagnosis present

## 2024-05-22 DIAGNOSIS — R52 Pain, unspecified: Secondary | ICD-10-CM | POA: Diagnosis not present

## 2024-05-22 DIAGNOSIS — I5042 Chronic combined systolic (congestive) and diastolic (congestive) heart failure: Secondary | ICD-10-CM | POA: Diagnosis present

## 2024-05-22 DIAGNOSIS — I5022 Chronic systolic (congestive) heart failure: Secondary | ICD-10-CM | POA: Diagnosis not present

## 2024-05-22 DIAGNOSIS — Z8249 Family history of ischemic heart disease and other diseases of the circulatory system: Secondary | ICD-10-CM

## 2024-05-22 DIAGNOSIS — R7989 Other specified abnormal findings of blood chemistry: Secondary | ICD-10-CM | POA: Insufficient documentation

## 2024-05-22 DIAGNOSIS — B20 Human immunodeficiency virus [HIV] disease: Secondary | ICD-10-CM | POA: Diagnosis not present

## 2024-05-22 DIAGNOSIS — I5084 End stage heart failure: Secondary | ICD-10-CM | POA: Diagnosis present

## 2024-05-22 DIAGNOSIS — I4892 Unspecified atrial flutter: Secondary | ICD-10-CM | POA: Diagnosis present

## 2024-05-22 DIAGNOSIS — F319 Bipolar disorder, unspecified: Secondary | ICD-10-CM | POA: Diagnosis present

## 2024-05-22 DIAGNOSIS — N179 Acute kidney failure, unspecified: Secondary | ICD-10-CM

## 2024-05-22 DIAGNOSIS — F17210 Nicotine dependence, cigarettes, uncomplicated: Secondary | ICD-10-CM | POA: Diagnosis present

## 2024-05-22 DIAGNOSIS — E785 Hyperlipidemia, unspecified: Secondary | ICD-10-CM | POA: Diagnosis present

## 2024-05-22 DIAGNOSIS — Z7982 Long term (current) use of aspirin: Secondary | ICD-10-CM

## 2024-05-22 DIAGNOSIS — Z7189 Other specified counseling: Secondary | ICD-10-CM

## 2024-05-22 DIAGNOSIS — A6002 Herpesviral infection of other male genital organs: Secondary | ICD-10-CM | POA: Diagnosis present

## 2024-05-22 DIAGNOSIS — G8929 Other chronic pain: Secondary | ICD-10-CM | POA: Diagnosis present

## 2024-05-22 DIAGNOSIS — R1013 Epigastric pain: Secondary | ICD-10-CM | POA: Diagnosis present

## 2024-05-22 DIAGNOSIS — Z604 Social exclusion and rejection: Secondary | ICD-10-CM | POA: Diagnosis present

## 2024-05-22 DIAGNOSIS — R0602 Shortness of breath: Secondary | ICD-10-CM | POA: Diagnosis present

## 2024-05-22 DIAGNOSIS — Z95810 Presence of automatic (implantable) cardiac defibrillator: Secondary | ICD-10-CM

## 2024-05-22 DIAGNOSIS — I130 Hypertensive heart and chronic kidney disease with heart failure and stage 1 through stage 4 chronic kidney disease, or unspecified chronic kidney disease: Principal | ICD-10-CM | POA: Diagnosis present

## 2024-05-22 DIAGNOSIS — Z7951 Long term (current) use of inhaled steroids: Secondary | ICD-10-CM

## 2024-05-22 DIAGNOSIS — Z818 Family history of other mental and behavioral disorders: Secondary | ICD-10-CM

## 2024-05-22 DIAGNOSIS — R339 Retention of urine, unspecified: Secondary | ICD-10-CM | POA: Diagnosis present

## 2024-05-22 DIAGNOSIS — K819 Cholecystitis, unspecified: Principal | ICD-10-CM

## 2024-05-22 LAB — RAPID URINE DRUG SCREEN, HOSP PERFORMED
Amphetamines: NOT DETECTED
Barbiturates: NOT DETECTED
Benzodiazepines: POSITIVE — AB
Cocaine: NOT DETECTED
Opiates: POSITIVE — AB
Tetrahydrocannabinol: NOT DETECTED

## 2024-05-22 LAB — URINALYSIS, ROUTINE W REFLEX MICROSCOPIC
Bacteria, UA: NONE SEEN
Bilirubin Urine: NEGATIVE
Glucose, UA: NEGATIVE mg/dL
Hgb urine dipstick: NEGATIVE
Ketones, ur: NEGATIVE mg/dL
Leukocytes,Ua: NEGATIVE
Nitrite: NEGATIVE
Protein, ur: 30 mg/dL — AB
Specific Gravity, Urine: 1.043 — ABNORMAL HIGH (ref 1.005–1.030)
pH: 6 (ref 5.0–8.0)

## 2024-05-22 LAB — CBC WITH DIFFERENTIAL/PLATELET
Abs Immature Granulocytes: 0.01 10*3/uL (ref 0.00–0.07)
Basophils Absolute: 0 10*3/uL (ref 0.0–0.1)
Basophils Relative: 1 %
Eosinophils Absolute: 0 10*3/uL (ref 0.0–0.5)
Eosinophils Relative: 0 %
HCT: 45.1 % (ref 39.0–52.0)
Hemoglobin: 15.4 g/dL (ref 13.0–17.0)
Immature Granulocytes: 0 %
Lymphocytes Relative: 20 %
Lymphs Abs: 1 10*3/uL (ref 0.7–4.0)
MCH: 34.1 pg — ABNORMAL HIGH (ref 26.0–34.0)
MCHC: 34.1 g/dL (ref 30.0–36.0)
MCV: 100 fL (ref 80.0–100.0)
Monocytes Absolute: 0.4 10*3/uL (ref 0.1–1.0)
Monocytes Relative: 9 %
Neutro Abs: 3.7 10*3/uL (ref 1.7–7.7)
Neutrophils Relative %: 70 %
Platelets: 160 10*3/uL (ref 150–400)
RBC: 4.51 MIL/uL (ref 4.22–5.81)
RDW: 16.1 % — ABNORMAL HIGH (ref 11.5–15.5)
WBC: 5.2 10*3/uL (ref 4.0–10.5)
nRBC: 0 % (ref 0.0–0.2)

## 2024-05-22 LAB — COMPREHENSIVE METABOLIC PANEL WITH GFR
ALT: 47 U/L — ABNORMAL HIGH (ref 0–44)
AST: 45 U/L — ABNORMAL HIGH (ref 15–41)
Albumin: 4 g/dL (ref 3.5–5.0)
Alkaline Phosphatase: 78 U/L (ref 38–126)
Anion gap: 13 (ref 5–15)
BUN: 27 mg/dL — ABNORMAL HIGH (ref 8–23)
CO2: 19 mmol/L — ABNORMAL LOW (ref 22–32)
Calcium: 9.3 mg/dL (ref 8.9–10.3)
Chloride: 106 mmol/L (ref 98–111)
Creatinine, Ser: 1.93 mg/dL — ABNORMAL HIGH (ref 0.61–1.24)
GFR, Estimated: 39 mL/min — ABNORMAL LOW (ref >60)
Glucose, Bld: 102 mg/dL — ABNORMAL HIGH (ref 70–99)
Potassium: 5.8 mmol/L — ABNORMAL HIGH (ref 3.5–5.1)
Sodium: 138 mmol/L (ref 135–145)
Total Bilirubin: 3.1 mg/dL — ABNORMAL HIGH (ref 0.0–1.2)
Total Protein: 7 g/dL (ref 6.5–8.1)

## 2024-05-22 LAB — BRAIN NATRIURETIC PEPTIDE: B Natriuretic Peptide: 1241.9 pg/mL — ABNORMAL HIGH (ref 0.0–100.0)

## 2024-05-22 LAB — TROPONIN I (HIGH SENSITIVITY)
Troponin I (High Sensitivity): 61 ng/L — ABNORMAL HIGH (ref <18)
Troponin I (High Sensitivity): 51 ng/L — ABNORMAL HIGH (ref <18)

## 2024-05-22 MED ORDER — IOHEXOL 350 MG/ML SOLN
80.0000 mL | Freq: Once | INTRAVENOUS | Status: AC | PRN
  Administered 2024-05-22: 80 mL via INTRAVENOUS

## 2024-05-22 MED ORDER — ONDANSETRON HCL 4 MG/2ML IJ SOLN
4.0000 mg | Freq: Once | INTRAMUSCULAR | Status: AC
  Administered 2024-05-22: 4 mg via INTRAVENOUS
  Filled 2024-05-22: qty 2

## 2024-05-22 MED ORDER — FENTANYL CITRATE PF 50 MCG/ML IJ SOSY
50.0000 ug | PREFILLED_SYRINGE | Freq: Once | INTRAMUSCULAR | Status: AC
  Administered 2024-05-22: 50 ug via INTRAVENOUS
  Filled 2024-05-22: qty 1

## 2024-05-22 MED ORDER — METRONIDAZOLE 500 MG/100ML IV SOLN
500.0000 mg | Freq: Once | INTRAVENOUS | Status: AC
  Administered 2024-05-23: 500 mg via INTRAVENOUS
  Filled 2024-05-22: qty 100

## 2024-05-22 MED ORDER — DIPHENHYDRAMINE HCL 50 MG/ML IJ SOLN
25.0000 mg | Freq: Once | INTRAMUSCULAR | Status: AC
  Administered 2024-05-22: 25 mg via INTRAVENOUS
  Filled 2024-05-22: qty 1

## 2024-05-22 MED ORDER — LACTATED RINGERS IV BOLUS
500.0000 mL | Freq: Once | INTRAVENOUS | Status: AC
  Administered 2024-05-22: 500 mL via INTRAVENOUS

## 2024-05-22 MED ORDER — MIDODRINE HCL 5 MG PO TABS
30.0000 mg | ORAL_TABLET | Freq: Three times a day (TID) | ORAL | Status: DC
  Administered 2024-05-22 – 2024-05-29 (×22): 30 mg via ORAL
  Filled 2024-05-22 (×22): qty 6

## 2024-05-22 MED ORDER — PROCHLORPERAZINE EDISYLATE 10 MG/2ML IJ SOLN
10.0000 mg | Freq: Once | INTRAMUSCULAR | Status: AC
  Administered 2024-05-22: 10 mg via INTRAVENOUS
  Filled 2024-05-22: qty 2

## 2024-05-22 MED ORDER — SODIUM CHLORIDE 0.9 % IV SOLN
2.0000 g | Freq: Once | INTRAVENOUS | Status: AC
  Administered 2024-05-22: 2 g via INTRAVENOUS
  Filled 2024-05-22: qty 20

## 2024-05-22 NOTE — Assessment & Plan Note (Signed)
 In the persistent nausea vomiting. Hold torsemide  obtain electrolytes

## 2024-05-22 NOTE — Assessment & Plan Note (Signed)
 Continue home medications.  Check CD4 and HIV viral counts

## 2024-05-22 NOTE — Assessment & Plan Note (Signed)
 Mild epigastric pain.  Troponin downtrending.  Continue to monitor patient is on hospice with recurrent history of chest pain controlled with morphine  Patient is asking if there is any other medications that he can use will try fentanyl  as needed

## 2024-05-22 NOTE — Assessment & Plan Note (Signed)
 Hemoglobin stable

## 2024-05-22 NOTE — Subjective & Objective (Signed)
  End-stage systolic heart failure and HIV disease patient is currently on hospice Has had recurrent admissions for systolic heart failure exacerbation Has been having intermittent chest pain recent admission for the same He is maintained on torsemide  and midodrine  And discharged recently on morphine  as needed

## 2024-05-22 NOTE — Assessment & Plan Note (Signed)
-  will monitor on tele avoid QT prolonging medications, rehydrate correct electrolytes ? ?

## 2024-05-22 NOTE — Assessment & Plan Note (Signed)
  Also continue midodrine  30 mg 3 times daily amiodarone  200 mg daily aspirin  81 mg daily Resume torsemide  when able Currently appears to be in AKI dehydrated Would avoid over aggressive fluid resuscitation

## 2024-05-22 NOTE — ED Triage Notes (Signed)
 Pt reports feeling shob, lightheaded x3 days, was having emesis, left chest and epigastric pain worsening. Hospice nurse said they would check on him in a few days. Initial EKG possible changes, PA and EDP to bedside for eval.

## 2024-05-22 NOTE — Assessment & Plan Note (Signed)
 Imaging equivocal.  Slight elevated LFTs but that is also in the setting of possible cirrhosis likely cardiogenic edema. Patient currently on antibiotics Rocephin  and Flagyl  will continue General Surgery feels that he is not a candidate for operative intervention if symptoms persist could consider IR consult for gallbladder drain Appreciate official general surgery consult in a.m.

## 2024-05-22 NOTE — Assessment & Plan Note (Signed)
 On amiodarone  200 mg daily and baby aspirin  patient is on hospice no longer on anticoagulation

## 2024-05-22 NOTE — ED Provider Notes (Signed)
 [place] EMERGENCY DEPARTMENT AT [facility] Provider Note  CSN: [id number] Arrival date & time: 05/22/24 1645  Chief Complaint(s) Chest Pain  HPI Ryan Long is a 63 y.o. male with PMH end-stage CHF on hospice with EF 20 to 25%, HTN, HLD, HIV, COPD, anxiety, depression, bipolar disorder who presents emergency room for evaluation of chest pain, abdominal pain nausea and vomiting.  States that symptoms have been worsening over the last 3 days but today significant worsened.  Patient arrives with complaints of chest pain and epigastric abdominal pain.  Arrives ill-appearing and tachycardic.  Denies headache, fever, diarrhea or other systemic symptoms.   Past Medical History Past Medical History:  Diagnosis Date   Active smoker    AICD (automatic cardioverter/defibrillator) present    Alcohol  abuse    Allergy July 2023   Anxiety    AR (allergic rhinitis)    Bipolar 1 disorder (HCC)    Cervical lymphadenitis 04/20/2021   CHF (congestive heart failure) (HCC)    Chronic bronchitis (HCC)    Chronic systolic heart failure (HCC)    Controlled substance agreement signed 10/22/2018   COPD (chronic obstructive pulmonary disease) (HCC)    COPD (chronic obstructive pulmonary disease) (HCC) 10/04/2015   Cough 12/31/2018   Crack cocaine use    Depression    Genital herpes    HIV (human immunodeficiency virus infection) (HCC) dx'd 08/1993   HLD (hyperlipidemia)    Hypertension    NICM (nonischemic cardiomyopathy) (HCC)    Echocardiogram 06/28/11: EF 30-35%, mild MR, mild LAE;  No CAD by coronary CT angiogram 3/12 at [facility]   NSVT (nonsustained ventricular tachycardia) (HCC)    PTSD (post-traumatic stress disorder)    Patient Active Problem List   Diagnosis Date Noted   Acute cholecystitis 05/22/2024   Hyperkalemia 05/22/2024   AKI (acute kidney injury) (HCC) 05/22/2024   Acute CHF (HCC) 04/23/2024   Malnutrition of moderate degree 04/22/2024    Hypokalemia 04/19/2024   Hypotension 04/19/2024   Debility 04/19/2024   Musculoskeletal pain 03/22/2024   Acute on chronic heart failure (HCC) 03/18/2024   Chronic kidney disease, stage 3a (HCC) 02/22/2024   Acute on chronic HFrEF (heart failure with reduced ejection fraction) (HCC) 02/17/2024   Chest pain 02/17/2024   Ventricular tachycardia (HCC) 01/23/2024   Acute on chronic systolic CHF (congestive heart failure) (HCC) 01/03/2024   Asthmatic bronchitis 01/24/2023   Rash and nonspecific skin eruption 12/19/2022   Arm numbness 11/15/2022   Aortic atherosclerosis (HCC) 11/15/2022   Chronic kidney disease 08/08/2022   Goals of care, counseling/discussion 08/08/2022   Cigarette smoker 01/26/2022   ETD (Eustachian tube dysfunction), right 01/26/2022   Allergy to pollen 01/26/2022   Polycythemia 12/08/2021   Vitamin D deficiency 12/08/2021   Hip pain 01/06/2021   VT (ventricular tachycardia) (HCC) 08/25/2020   HIV positive (HCC) 07/12/2020   Paroxysmal atrial fibrillation (HCC) 06/10/2020   BPH (benign prostatic hyperplasia) 01/28/2020   HFrEF (heart failure with reduced ejection fraction) (HCC) 01/28/2020   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   Achilles tendinitis, left leg 04/03/2019   AICD (automatic cardioverter/defibrillator) present 08/15/2018   QT prolongation 06/28/2018   Adjustment disorder with mixed disturbance of emotions and conduct    NICM (nonischemic cardiomyopathy) (HCC) 01/08/2018   Anxiety 12/20/2017   Bipolar 1 disorder (HCC) 10/10/2017   End-stage systolic heart failure, chronic (HCC) 05/13/2017   HSV-2 infection 07/17/2016   History of attempted suicide 04/14/2016   History of alcoholism (HCC) 04/14/2016  History of substance abuse (HCC) 04/14/2016   Constipation 01/17/2016   COPD (chronic obstructive pulmonary disease) (HCC) 10/04/2015   Abnormal thyroid  stimulating hormone (TSH) level 10/04/2015   MDD (major depressive disorder), recurrent episode,  severe (HCC) 09/23/2015   Hypertension 09/10/2015   PTSD (post-traumatic stress disorder) 08/07/2015   Primary insomnia 04/02/2015   Herpesviral infection of penis 04/02/2015   Depression with suicidal ideation 07/03/2014   Generalized anxiety disorder 05/29/2014   Tobacco use disorder 02/17/2013   Heart failure (HCC) 09/19/2011   Other primary cardiomyopathies 08/24/2011   HIV disease (HCC) 02/01/2009   Recurrent HSV (herpes simplex virus) 02/01/2009   Hyperlipidemia 02/01/2009   Allergic rhinitis 02/01/2009   Home Medication(s) Prior to Admission medications   Medication Sig Start Date End Date Taking? Authorizing Provider  acetaminophen  (TYLENOL ) 325 MG tablet Take 2 tablets (650 mg total) by mouth every 6 (six) hours as needed for mild pain (pain score 1-3) (or Fever >/= 101). 03/07/24  Yes Barbee Lew, MD  albuterol  (VENTOLIN  HFA) 108 (90 Base) MCG/ACT inhaler Inhale 2 puffs into the lungs every 6 (six) hours as needed for wheezing or shortness of breath. 07/30/23  Yes Vernell Goldsmith, MD  amiodarone  (PACERONE ) 200 MG tablet Take 1 tablet (200 mg total) by mouth daily. 03/28/24  Yes Camnitz, Will Gaylyn Keas, MD  aspirin  EC 81 MG tablet Take 81 mg by mouth in the morning. Swallow whole.   Yes [provider]  bictegravir-emtricitabine -tenofovir  AF (BIKTARVY ) 50-200-25 MG TABS tablet Take 1 tablet by mouth daily. Patient taking differently: Take 1 tablet by mouth in the morning. 08/15/23  Yes Ernie Heal, Jerelyn Money, MD  finasteride  (PROSCAR ) 5 MG tablet TAKE 1 TABLET(5 MG) BY MOUTH DAILY Patient taking differently: Take 5 mg by mouth in the morning. 03/15/23  Yes Vernell Goldsmith, MD  LORazepam  (ATIVAN ) 1 MG tablet Take 1 tablet (1 mg total) by mouth every 8 (eight) hours as needed for anxiety. 05/09/24  Yes Armenta Landau, MD  mexiletine (MEXITIL ) 250 MG capsule Take 1 capsule (250 mg total) by mouth every 12 (twelve) hours. 02/08/24  Yes Debria Fang, PA-C   midodrine  (PROAMATINE ) 10 MG tablet Take 3 tablets (30 mg total) by mouth 3 (three) times daily with meals. 04/28/24  Yes Ozell Blunt, MD  morphine  10 MG/5ML solution Take 1.3 mLs (2.6 mg total) by mouth every 6 (six) hours as needed for severe pain (pain score 7-10). 04/28/24  Yes Ozell Blunt, MD  nitroGLYCERIN  (NITROSTAT ) 0.4 MG SL tablet Place 0.4 mg under the tongue every 5 (five) minutes as needed for chest pain.   Yes [provider]  ondansetron  (ZOFRAN ) 4 MG tablet Take 1 tablet (4 mg total) by mouth every 8 (eight) hours as needed for nausea or vomiting. 03/12/24  Yes Camnitz, Babetta Lesch, MD  pantoprazole  (PROTONIX ) 40 MG tablet Take 1 tablet (40 mg total) by mouth daily at 6 (six) AM. 05/10/24  Yes Armenta Landau, MD  polyethylene glycol (MIRALAX  / GLYCOLAX ) 17 g packet Take 17 g by mouth daily. Patient taking differently: Take 17 g by mouth daily as needed for moderate constipation. 02/09/24  Yes Debria Fang, PA-C  potassium chloride  SA (KLOR-CON  M) 20 MEQ tablet Take 2 tablets (40 mEq total) by mouth daily. 05/10/24  Yes Armenta Landau, MD  SYMBICORT 80-4.5 MCG/ACT inhaler Inhale 2 puffs into the lungs in the morning and at bedtime. 03/06/24  Yes [provider]  tamsulosin  (  FLOMAX ) 0.4 MG CAPS capsule Take 2 capsules (0.8 mg total) by mouth daily. Patient taking differently: Take 0.4 mg by mouth in the morning and at bedtime. 04/29/24  Yes Ozell Blunt, MD  torsemide  (DEMADEX ) 20 MG tablet Take 1 tablet (20 mg total) by mouth 2 (two) times daily. 05/09/24 06/08/24 Yes Armenta Landau, MD  traZODone  (DESYREL ) 150 MG tablet Take 150 mg by mouth at bedtime as needed for sleep. 04/09/24  Yes [provider]  valACYclovir  (VALTREX ) 500 MG tablet Take 1 tablet (500 mg total) by mouth daily. Patient taking differently: Take 500 mg by mouth in the morning. 11/15/22  Yes Vernell Goldsmith, MD  senna (SENOKOT) 8.6 MG TABS tablet Take 2 tablets (17.2 mg  total) by mouth daily as needed for mild constipation. Patient not taking: Reported on 05/22/2024 04/28/24   Ozell Blunt, MD                                                                                                                                    Past Surgical History Past Surgical History:  Procedure Laterality Date   CARDIAC DEFIBRILLATOR PLACEMENT  01/08/2018   ICD IMPLANT N/A 01/08/2018   Procedure: ICD IMPLANT;  Surgeon: Lei Pump, MD;  Location: [facility] INVASIVE CV LAB;  Service: Cardiovascular;  Laterality: N/A;   RIGHT/LEFT HEART CATH AND CORONARY ANGIOGRAPHY N/A 01/03/2024   Procedure: RIGHT/LEFT HEART CATH AND CORONARY ANGIOGRAPHY;  Surgeon: Mardell Shade, MD;  Location: [facility] INVASIVE CV LAB;  Service: Cardiovascular;  Laterality: N/A;   Family History Family History  Problem Relation Age of Onset   Glaucoma Mother    Mental illness Mother    Vision loss Mother    Hypertension Father    CAD Father    Mental illness Father    Alcohol  abuse Father    Drug abuse Father    Heart attack Father 29   Early death Father    Heart disease Father    Alcohol  abuse Brother    Drug abuse Brother    Drug abuse Brother     Social History Social History   Tobacco Use   Smoking status: Every Day    Current packs/day: 0.75    Average packs/day: 0.8 packs/day for 42.0 years (31.5 ttl pk-yrs)    Types: Cigarettes   Smokeless tobacco: Never   Tobacco comments:    hoping welbutrin will help  Vaping Use   Vaping status: Never Used  Substance Use Topics   Alcohol  use: Not Currently    Alcohol /week: 6.0 standard drinks of alcohol     Comment: 01/08/2018 "stopped 06/2017"   Drug use: Not Currently    Types: "Crack" cocaine    Comment: 01/08/2018 "stopped 06/2017"   Allergies Patient has no known allergies.  Review of Systems Review of Systems  Respiratory:  Positive for shortness of breath.   Cardiovascular:  Positive for chest pain.  Gastrointestinal:  Positive  for abdominal pain, nausea and vomiting.    Physical Exam Vital Signs  I have reviewed the triage vital signs BP (!) 157/142   Pulse (!) 120   Temp 97.9 F (36.6 C)   Resp 15   SpO2 100%   Physical Exam Constitutional:      General: He is in acute distress.     Appearance: Normal appearance. He is ill-appearing.  HENT:     Head: Normocephalic and atraumatic.     Nose: No congestion or rhinorrhea.  Eyes:     General:        Right eye: No discharge.        Left eye: No discharge.     Extraocular Movements: Extraocular movements intact.     Pupils: Pupils are equal, round, and reactive to light.  Cardiovascular:     Rate and Rhythm: Regular rhythm. Tachycardia present.     Heart sounds: No murmur heard. Pulmonary:     Effort: No respiratory distress.     Breath sounds: No wheezing or rales.  Abdominal:     General: There is no distension.     Tenderness: There is abdominal tenderness.  Musculoskeletal:        General: Normal range of motion.     Cervical back: Normal range of motion.  Skin:    General: Skin is warm and dry.  Neurological:     General: No focal deficit present.     Mental Status: He is alert.     ED Results and Treatments Labs (all labs ordered are listed, but only abnormal results are displayed) Labs Reviewed  COMPREHENSIVE METABOLIC PANEL WITH GFR - Abnormal; Notable for the following components:      Result Value   Potassium 5.8 (*)    CO2 19 (*)    Glucose, Bld 102 (*)    BUN 27 (*)    Creatinine, Ser 1.93 (*)    AST 45 (*)    ALT 47 (*)    Total Bilirubin 3.1 (*)    GFR, Estimated 39 (*)    All other components within normal limits  CBC WITH DIFFERENTIAL/PLATELET - Abnormal; Notable for the following components:   MCH 34.1 (*)    RDW 16.1 (*)    All other components within normal limits  BRAIN NATRIURETIC PEPTIDE - Abnormal; Notable for the following components:   B Natriuretic Peptide 1,241.9 (*)    All other components within  normal limits  URINALYSIS, ROUTINE W REFLEX MICROSCOPIC - Abnormal; Notable for the following components:   Specific Gravity, Urine 1.043 (*)    Protein, ur 30 (*)    All other components within normal limits  RAPID URINE DRUG SCREEN, HOSP PERFORMED - Abnormal; Notable for the following components:   Opiates POSITIVE (*)    Benzodiazepines POSITIVE (*)    All other components within normal limits  COMPREHENSIVE METABOLIC PANEL WITH GFR - Abnormal; Notable for the following components:   CO2 19 (*)    BUN 26 (*)    Creatinine, Ser 1.68 (*)    Calcium  8.8 (*)    Total Protein 6.2 (*)    ALT 45 (*)    Total Bilirubin 2.4 (*)    GFR, Estimated 46 (*)    All other components within normal limits  PROTIME-INR - Abnormal; Notable for the following components:   Prothrombin Time 16.4 (*)    INR 1.3 (*)    All other components within normal limits  TROPONIN I (HIGH SENSITIVITY) - Abnormal; Notable for the following components:   Troponin I (High Sensitivity) 61 (*)    All other components within normal limits  TROPONIN I (HIGH SENSITIVITY) - Abnormal; Notable for the following components:   Troponin I (High Sensitivity) 51 (*)    All other components within normal limits  TROPONIN I (HIGH SENSITIVITY) - Abnormal; Notable for the following components:   Troponin I (High Sensitivity) 42 (*)    All other components within normal limits  LACTIC ACID, PLASMA  CK  CREATININE, URINE, RANDOM  SODIUM, URINE, RANDOM  MAGNESIUM   PHOSPHORUS  PREALBUMIN  TSH  LACTIC ACID, PLASMA  OSMOLALITY, URINE  CD4/CD8 (T-HELPER/T-SUPPRESSOR CELL)  HIV-1 RNA QUANT-NO REFLEX-BLD  OSMOLALITY  TROPONIN I (HIGH SENSITIVITY)                                                                                                                          Radiology US  Abdomen Limited RUQ (LIVER/GB) Result Date: 05/22/2024 CLINICAL DATA:  Cholecystitis. EXAM: ULTRASOUND ABDOMEN LIMITED RIGHT UPPER QUADRANT COMPARISON:   CT abdomen and pelvis 05/22/2024 FINDINGS: Gallbladder: There is gallbladder wall thickening measuring 4.8 mm. Pericholecystic is present. No calculi are seen. Sonographic Abigail Abler sign is negative. Common bile duct: Diameter: 4.1 mm Liver: No focal lesion identified. Mildly nodular contour. Within normal limits in parenchymal echogenicity. Portal vein is patent on color Doppler imaging with normal direction of blood flow towards the liver. Other: Trace free fluid in the right upper quadrant. IMPRESSION: 1. Gallbladder wall thickening and pericholecystic fluid. No calculi are seen. Sonographic Abigail Abler sign is negative. Findings are equivocal for acute cholecystitis. 2. Mildly nodular contour of the liver, which can be seen in the setting of cirrhosis. 3. Trace free fluid in the right upper quadrant. Electronically Signed   By: Tyron Gallon M.D.   On: 05/22/2024 22:20   CT Angio Chest PE W and/or Wo Contrast Result Date: 05/22/2024 CLINICAL DATA:  None EXAM: CT ANGIOGRAPHY CHEST CT ABDOMEN AND PELVIS WITH CONTRAST TECHNIQUE: Multidetector CT imaging of the chest was performed using the standard protocol during bolus administration of intravenous contrast. Multiplanar CT image reconstructions and MIPs were obtained to evaluate the vascular anatomy. Multidetector CT imaging of the abdomen and pelvis was performed using the standard protocol during bolus administration of intravenous contrast. RADIATION DOSE REDUCTION: This exam was performed according to the departmental dose-optimization program which includes automated exposure control, adjustment of the mA and/or kV according to patient size and/or use of iterative reconstruction technique. CONTRAST:  80mL OMNIPAQUE  IOHEXOL  350 MG/ML SOLN COMPARISON:  None Available. FINDINGS: CTA CHEST FINDINGS Cardiovascular: Mild to moderate global cardiomegaly. There is adequate opacification of the pulmonary arterial tree. No intraluminal filling defect identified to suggest  acute pulmonary embolism. The central pulmonary arteries are of normal caliber. Reflux of contrast into the hepatic venous systems in keeping with at least some degree of right heart failure. No pericardial effusion. Mild atherosclerotic calcification within the  thoracic aorta. Left single lead transvenous pacemaker is in place with its lead within the right ventricle. Mediastinum/Nodes: No enlarged mediastinal, hilar, or axillary lymph nodes. Thyroid  gland, trachea, and esophagus demonstrate no significant findings. Lungs/Pleura: Lungs are clear. No pleural effusion or pneumothorax. Musculoskeletal: No chest wall abnormality. No acute or significant osseous findings. Review of the MIP images confirms the above findings. CT ABDOMEN and PELVIS FINDINGS Hepatobiliary: There is gallbladder wall thickening and pericholecystic edema, nonspecific in the setting trace perihepatic ascites and evidence of cardiogenic failure. No enhancing intrahepatic mass. No intra or extrahepatic biliary ductal dilation. Pancreas: Unremarkable Spleen: Unremarkable Adrenals/Urinary Tract: The adrenal glands are unremarkable. Striated bilateral cortical nephrogram has progressed slightly since prior examination suggesting an infectious or inflammatory process as can be seen with pyelonephritis and/or acute kidney injury. The kidneys are normal in size and position. No hydronephrosis. No intrarenal or ureteral calculi. The bladder is unremarkable. Stomach/Bowel: Trace perihepatic ascites. Stomach, small bowel, and large bowel are unremarkable save for mild sigmoid diverticulosis. Appendix normal. No free intraperitoneal gas. Vascular/Lymphatic: No significant vascular findings are present. No enlarged abdominal or pelvic lymph nodes. Reproductive: Prostate is unremarkable. Other: No abdominal wall hernia. Mild diffuse subcutaneous body wall edema. Musculoskeletal: No acute or significant osseous findings. Review of the MIP images confirms the  above findings. IMPRESSION: 1. No acute pulmonary embolism. 2. Mild to moderate global cardiomegaly. Reflux of contrast into the hepatic venous systems in keeping with at least some degree of right heart failure. 3. Striated bilateral cortical nephrogram has progressed slightly since prior examination suggesting an infectious or inflammatory process as can be seen with pyelonephritis and/or acute kidney injury. Correlation with urinalysis and renal function tests are recommended. 4. Gallbladder wall thickening and pericholecystic edema, nonspecific in the setting of trace perihepatic ascites and evidence of cardiogenic failure. While this can simply represent a component of congestive hepatopathy, acute inflammatory conditions of the liver, biliary tree or gallbladder itself including acute cholecystitis, could appear in this fashion. Right upper quadrant sonography and or hepatic biliary scintigraphy may be helpful for further evaluation if there is clinical concern for acute cholecystitis. 5. Mild sigmoid diverticulosis. 6. Aortic atherosclerosis. Aortic Atherosclerosis (ICD10-I70.0). Electronically Signed   By: Worthy Heads M.D.   On: 05/22/2024 21:23   CT ABDOMEN PELVIS W CONTRAST Result Date: 05/22/2024 CLINICAL DATA:  None EXAM: CT ANGIOGRAPHY CHEST CT ABDOMEN AND PELVIS WITH CONTRAST TECHNIQUE: Multidetector CT imaging of the chest was performed using the standard protocol during bolus administration of intravenous contrast. Multiplanar CT image reconstructions and MIPs were obtained to evaluate the vascular anatomy. Multidetector CT imaging of the abdomen and pelvis was performed using the standard protocol during bolus administration of intravenous contrast. RADIATION DOSE REDUCTION: This exam was performed according to the departmental dose-optimization program which includes automated exposure control, adjustment of the mA and/or kV according to patient size and/or use of iterative reconstruction  technique. CONTRAST:  80mL OMNIPAQUE  IOHEXOL  350 MG/ML SOLN COMPARISON:  None Available. FINDINGS: CTA CHEST FINDINGS Cardiovascular: Mild to moderate global cardiomegaly. There is adequate opacification of the pulmonary arterial tree. No intraluminal filling defect identified to suggest acute pulmonary embolism. The central pulmonary arteries are of normal caliber. Reflux of contrast into the hepatic venous systems in keeping with at least some degree of right heart failure. No pericardial effusion. Mild atherosclerotic calcification within the thoracic aorta. Left single lead transvenous pacemaker is in place with its lead within the right ventricle. Mediastinum/Nodes: No enlarged mediastinal, hilar, or axillary  lymph nodes. Thyroid  gland, trachea, and esophagus demonstrate no significant findings. Lungs/Pleura: Lungs are clear. No pleural effusion or pneumothorax. Musculoskeletal: No chest wall abnormality. No acute or significant osseous findings. Review of the MIP images confirms the above findings. CT ABDOMEN and PELVIS FINDINGS Hepatobiliary: There is gallbladder wall thickening and pericholecystic edema, nonspecific in the setting trace perihepatic ascites and evidence of cardiogenic failure. No enhancing intrahepatic mass. No intra or extrahepatic biliary ductal dilation. Pancreas: Unremarkable Spleen: Unremarkable Adrenals/Urinary Tract: The adrenal glands are unremarkable. Striated bilateral cortical nephrogram has progressed slightly since prior examination suggesting an infectious or inflammatory process as can be seen with pyelonephritis and/or acute kidney injury. The kidneys are normal in size and position. No hydronephrosis. No intrarenal or ureteral calculi. The bladder is unremarkable. Stomach/Bowel: Trace perihepatic ascites. Stomach, small bowel, and large bowel are unremarkable save for mild sigmoid diverticulosis. Appendix normal. No free intraperitoneal gas. Vascular/Lymphatic: No significant  vascular findings are present. No enlarged abdominal or pelvic lymph nodes. Reproductive: Prostate is unremarkable. Other: No abdominal wall hernia. Mild diffuse subcutaneous body wall edema. Musculoskeletal: No acute or significant osseous findings. Review of the MIP images confirms the above findings. IMPRESSION: 1. No acute pulmonary embolism. 2. Mild to moderate global cardiomegaly. Reflux of contrast into the hepatic venous systems in keeping with at least some degree of right heart failure. 3. Striated bilateral cortical nephrogram has progressed slightly since prior examination suggesting an infectious or inflammatory process as can be seen with pyelonephritis and/or acute kidney injury. Correlation with urinalysis and renal function tests are recommended. 4. Gallbladder wall thickening and pericholecystic edema, nonspecific in the setting of trace perihepatic ascites and evidence of cardiogenic failure. While this can simply represent a component of congestive hepatopathy, acute inflammatory conditions of the liver, biliary tree or gallbladder itself including acute cholecystitis, could appear in this fashion. Right upper quadrant sonography and or hepatic biliary scintigraphy may be helpful for further evaluation if there is clinical concern for acute cholecystitis. 5. Mild sigmoid diverticulosis. 6. Aortic atherosclerosis. Aortic Atherosclerosis (ICD10-I70.0). Electronically Signed   By: Worthy Heads M.D.   On: 05/22/2024 21:23   DG Chest Portable 1 View Result Date: 05/22/2024 CLINICAL DATA:  Chest pain EXAM: PORTABLE CHEST 1 VIEW COMPARISON:  04/19/2024 FINDINGS: Right ventricular ICD. Chin overlies the right apex and thoracic inlet. Midline trachea. Moderate cardiomegaly. No pleural effusion or pneumothorax. No congestive failure. Clear lungs. IMPRESSION: Cardiomegaly without congestive failure. Electronically Signed   By: Lore Rode M.D.   On: 05/22/2024 17:27    Pertinent labs & imaging  results that were available during my care of the patient were reviewed by me and considered in my medical decision making (see MDM for details).  Medications Ordered in ED Medications  midodrine  (PROAMATINE ) tablet 30 mg (30 mg Oral Given 05/22/24 1717)  metroNIDAZOLE  (FLAGYL ) IVPB 500 mg (500 mg Intravenous New Bag/Given 05/23/24 0006)  lactated ringers  bolus 500 mL (0 mLs Intravenous Stopped 05/22/24 1925)  ondansetron  (ZOFRAN ) injection 4 mg (4 mg Intravenous Given 05/22/24 1718)  fentaNYL  (SUBLIMAZE ) injection 50 mcg (50 mcg Intravenous Given 05/22/24 1717)  fentaNYL  (SUBLIMAZE ) injection 50 mcg (50 mcg Intravenous Given 05/22/24 1941)  prochlorperazine (COMPAZINE) injection 10 mg (10 mg Intravenous Given 05/22/24 1938)  diphenhydrAMINE  (BENADRYL) injection 25 mg (25 mg Intravenous Given 05/22/24 1938)  iohexol  (OMNIPAQUE ) 350 MG/ML injection 80 mL (80 mLs Intravenous Contrast Given 05/22/24 2042)  cefTRIAXone  (ROCEPHIN ) 2 g in sodium chloride  0.9 % 100 mL IVPB (0 g Intravenous  Stopped 05/22/24 2325)                                                                                                                                     Procedures Procedures  (including critical care time)  Medical Decision Making / ED Course   This patient presents to the ED for concern of chest pain, abdominal pain, nausea and vomiting, this involves an extensive number of treatment options, and is a complaint that carries with it a high risk of complications and morbidity.  The differential diagnosis includes ACS, Aortic Dissection, Pneumothorax, Pneumonia, Esophageal Rupture, PE, Tamponade/Pericardial Effusion, pericarditis, esophageal spasm, dysrhythmia, GERD, costochondritis.  MDM: Patient seen emergency room for evaluation of chest pain, abdominal pain, nausea and vomiting.  Physical exam reveals an ill-appearing tachycardic patient with tenderness in the epigastrium and right upper quadrant.  Laboratory evaluation with  a BNP of 1241 which is downtrending from hospital discharge, BUN 27, creatinine 1.93, high-sensitivity troponin 61 and delta troponin 51.  Chest x-ray showing cardiomegaly but no pulmonary edema.  CT PE obtained in setting of chest pain with tachycardia that was negative for pulmonary embolism, CT abdomen pelvis showing pericholecystic fluid and possible cholecystitis as well as possible pyelonephritis.  Urinalysis without evidence of infection.  Right upper quadrant ultrasound obtained showing gallbladder wall thickening with pericholecystic fluid equivocal for acute cholecystitis.  On exam, he is still tender in the right upper quadrant I do have concern for developing acute cholecystitis.  I spoke with the general surgeon on-call Dr. Andy Bannister who states that he is a very poor surgical candidate given his decreased EF and is not recommending acute surgical intervention at this time.  Recommending antibiotics and hospital admission.  If symptoms worsen possible cholecystotomy tube.  Patient started on ceftriaxone  Flagyl  and will require hospital admission.  Patient admitted   Additional history obtained:  -External records from outside source obtained and reviewed including: Chart review including previous notes, labs, imaging, consultation notes   Lab Tests: -I ordered, reviewed, and interpreted labs.   The pertinent results include:   Labs Reviewed  COMPREHENSIVE METABOLIC PANEL WITH GFR - Abnormal; Notable for the following components:      Result Value   Potassium 5.8 (*)    CO2 19 (*)    Glucose, Bld 102 (*)    BUN 27 (*)    Creatinine, Ser 1.93 (*)    AST 45 (*)    ALT 47 (*)    Total Bilirubin 3.1 (*)    GFR, Estimated 39 (*)    All other components within normal limits  CBC WITH DIFFERENTIAL/PLATELET - Abnormal; Notable for the following components:   MCH 34.1 (*)    RDW 16.1 (*)    All other components within normal limits  BRAIN NATRIURETIC PEPTIDE - Abnormal; Notable for the  following components:   B Natriuretic Peptide 1,241.9 (*)    All other components  within normal limits  URINALYSIS, ROUTINE W REFLEX MICROSCOPIC - Abnormal; Notable for the following components:   Specific Gravity, Urine 1.043 (*)    Protein, ur 30 (*)    All other components within normal limits  RAPID URINE DRUG SCREEN, HOSP PERFORMED - Abnormal; Notable for the following components:   Opiates POSITIVE (*)    Benzodiazepines POSITIVE (*)    All other components within normal limits  COMPREHENSIVE METABOLIC PANEL WITH GFR - Abnormal; Notable for the following components:   CO2 19 (*)    BUN 26 (*)    Creatinine, Ser 1.68 (*)    Calcium  8.8 (*)    Total Protein 6.2 (*)    ALT 45 (*)    Total Bilirubin 2.4 (*)    GFR, Estimated 46 (*)    All other components within normal limits  PROTIME-INR - Abnormal; Notable for the following components:   Prothrombin Time 16.4 (*)    INR 1.3 (*)    All other components within normal limits  TROPONIN I (HIGH SENSITIVITY) - Abnormal; Notable for the following components:   Troponin I (High Sensitivity) 61 (*)    All other components within normal limits  TROPONIN I (HIGH SENSITIVITY) - Abnormal; Notable for the following components:   Troponin I (High Sensitivity) 51 (*)    All other components within normal limits  TROPONIN I (HIGH SENSITIVITY) - Abnormal; Notable for the following components:   Troponin I (High Sensitivity) 42 (*)    All other components within normal limits  LACTIC ACID, PLASMA  CK  CREATININE, URINE, RANDOM  SODIUM, URINE, RANDOM  MAGNESIUM   PHOSPHORUS  PREALBUMIN  TSH  LACTIC ACID, PLASMA  OSMOLALITY, URINE  CD4/CD8 (T-HELPER/T-SUPPRESSOR CELL)  HIV-1 RNA QUANT-NO REFLEX-BLD  OSMOLALITY  TROPONIN I (HIGH SENSITIVITY)      EKG  Sinus tachycardia, left bundle branch block    Imaging Studies ordered: I ordered imaging studies including chest x-ray, CT PE, CTAP I independently visualized and interpreted  imaging. I agree with the radiologist interpretation   Medicines ordered and prescription drug management: Meds ordered this encounter  Medications   lactated ringers  bolus 500 mL   ondansetron  (ZOFRAN ) injection 4 mg   fentaNYL  (SUBLIMAZE ) injection 50 mcg   midodrine  (PROAMATINE ) tablet 30 mg   fentaNYL  (SUBLIMAZE ) injection 50 mcg   prochlorperazine (COMPAZINE) injection 10 mg   diphenhydrAMINE  (BENADRYL) injection 25 mg   iohexol  (OMNIPAQUE ) 350 MG/ML injection 80 mL   cefTRIAXone  (ROCEPHIN ) 2 g in sodium chloride  0.9 % 100 mL IVPB    Antibiotic Indication::   Intra-abdominal   metroNIDAZOLE  (FLAGYL ) IVPB 500 mg    Antibiotic Indication::   Intra-abdominal Infection    -I have reviewed the patients home medicines and have made adjustments as needed  Critical interventions none  Consultations Obtained: I requested consultation with the general surgeon on-call Dr. Andy Bannister,  and discussed lab and imaging findings as well as pertinent plan - they recommend: Antibiotics and medical admission   Cardiac Monitoring: The patient was maintained on a cardiac monitor.  I personally viewed and interpreted the cardiac monitored which showed an underlying rhythm of: Sinus tachycardia  Social Determinants of Health:  Factors impacting patients care include: Currently enrolled in hospice   Reevaluation: After the interventions noted above, I reevaluated the patient and found that they have :improved  Co morbidities that complicate the patient evaluation  Past Medical History:  Diagnosis Date   Active smoker    AICD (automatic cardioverter/defibrillator) present  Alcohol  abuse    Allergy July 2023   Anxiety    AR (allergic rhinitis)    Bipolar 1 disorder (HCC)    Cervical lymphadenitis 04/20/2021   CHF (congestive heart failure) (HCC)    Chronic bronchitis (HCC)    Chronic systolic heart failure (HCC)    Controlled substance agreement signed 10/22/2018   COPD (chronic  obstructive pulmonary disease) (HCC)    COPD (chronic obstructive pulmonary disease) (HCC) 10/04/2015   Cough 12/31/2018   Crack cocaine use    Depression    Genital herpes    HIV (human immunodeficiency virus infection) (HCC) dx'd 08/1993   HLD (hyperlipidemia)    Hypertension    NICM (nonischemic cardiomyopathy) (HCC)    Echocardiogram 06/28/11: EF 30-35%, mild MR, mild LAE;  No CAD by coronary CT angiogram 3/12 at [facility]   NSVT (nonsustained ventricular tachycardia) (HCC)    PTSD (post-traumatic stress disorder)       Dispostion: I considered admission for this patient, and patient require hospital admission for possible developing acute cholecystitis     Final Clinical Impression(s) / ED Diagnoses Final diagnoses:  Cholecystitis     @PCDICTATION @    Yomara Toothman, Alyse July, MD 05/23/24 0100

## 2024-05-22 NOTE — H&P (Signed)
 Ryan Long ZOX:[id number] DOB: 09-09-1961 DOA: 05/22/2024     PCP: Tretha Fu, MD   Outpatient Specialists:  CARDS: Dr. Jules Oar, MD   Patient arrived to ER on 05/22/24 at 1645 Referred by Attending Edson Graces, MD   Patient coming from:    home Lives alone,   *** With family From facility *** SN[place], [place], [place]    Chief Complaint:   Chief Complaint  Patient presents with   Chest Pain    HPI: Ryan Long is a 63 y.o. male with medical history significant of end-stage heart failure, hypertension, hyperlipidemia, HIV, COPD, CKD, anxiety, depression, and bipolar disorder presents with chest pain and headaches.     Presented with   epigastric pain n/v  End-stage systolic heart failure and HIV disease patient is currently on hospice Has had recurrent admissions for systolic heart failure exacerbation Has been having intermittent chest pain recent admission for the same He is maintained on torsemide  and midodrine  And discharged recently on morphine  as needed       Denies significant ETOH intake *** Does not smoke*** but interested in quitting***  Denies marijuana use ***    Regarding pertinent Chronic problems: ***   HIV on Biktarvy       chronic CHF diastolic/systolic/ combined  On torsemide  - last echo  Recent Results (from the past 81191 hours)  ECHOCARDIOGRAM COMPLETE   Collection Time: 09/28/23  8:38 AM  Result Value   S' Lateral 5.20   AV Area VTI 2.78   AV Mean grad 5.0   AV Area mean vel 2.46   Area-P 1/2 3.74   AR max vel 2.45   AV Peak grad 9.0   Ao pk vel 1.50   Est EF 20 - 25%   Narrative      ECHOCARDIOGRAM REPORT     IMPRESSIONS    1. Left ventricular ejection fraction, by estimation, is 20 to 25%. The left ventricle has severely decreased function. The left ventricle demonstrates global hypokinesis. The left ventricular internal cavity size was mildly to moderately dilated. Left  ventricular diastolic  parameters are consistent with Grade I diastolic dysfunction (impaired relaxation).  2. Right ventricular systolic function is normal. The right ventricular size is normal. Tricuspid regurgitation signal is inadequate for assessing PA pressure.  3. The mitral valve is normal in structure. Mild mitral valve regurgitation.  4. The aortic valve is normal in structure. Aortic valve regurgitation is not visualized.  5. The inferior vena cava is normal in size with greater than 50% respiratory variability, suggesting right atrial pressure of 3 mmHg.  Comparison(s): No significant change from prior study.              COPD -on Symbicort   ***A. Fib -   atrial fibrillation CHA2DS2 vas score   2        Not on anticoagulation secondary to Risk of Falls,                  - Rhythm control:   amiodarone       CKD stage IIIa baseline Cr 1.5 CrCl cannot be calculated (Unknown ideal weight.).  Lab Results  Component Value Date   CREATININE 1.93 (H) 05/22/2024   CREATININE 1.06 05/09/2024   CREATININE 1.45 (H) 05/08/2024   Lab Results  Component Value Date   NA 138 05/22/2024   CL 106 05/22/2024   K 5.8 (H) 05/22/2024   CO2 19 (L) 05/22/2024   BUN 27 (  H) 05/22/2024   CREATININE 1.93 (H) 05/22/2024   GFRNONAA 39 (L) 05/22/2024   CALCIUM  9.3 05/22/2024   PHOS 3.8 04/21/2024   ALBUMIN  4.0 05/22/2024   GLUCOSE 102 (H) 05/22/2024      BPH - on Flomax , Proscar       While in ER:    Discussed with general surgery Not an operative candidate  Would rec IV abx for now If not better may need IR to drain     Lab Orders         Comprehensive metabolic panel         CBC with Differential         Brain natriuretic peptide         Urinalysis, Routine w reflex microscopic -Urine, Clean Catch         Rapid urine drug screen (hospital performed)        US  - equivocal for acute cholecystitis. cirrhosis.  CXR - Cardiomegaly without congestive failure.   CTabd/pelvis chest-    Pyelonephritis? UA non acute ? Acute cholecystitis  CTA  -  non-acute, no PE,   no evidence of infiltrate   Following Medications were ordered in ER: Medications  midodrine  (PROAMATINE ) tablet 30 mg (30 mg Oral Given 05/22/24 1717)  cefTRIAXone  (ROCEPHIN ) 2 g in sodium chloride  0.9 % 100 mL IVPB (2 g Intravenous New Bag/Given 05/22/24 2237)  metroNIDAZOLE  (FLAGYL ) IVPB 500 mg (has no administration in time range)  lactated ringers  bolus 500 mL (0 mLs Intravenous Stopped 05/22/24 1925)  ondansetron  (ZOFRAN ) injection 4 mg (4 mg Intravenous Given 05/22/24 1718)  fentaNYL  (SUBLIMAZE ) injection 50 mcg (50 mcg Intravenous Given 05/22/24 1717)  fentaNYL  (SUBLIMAZE ) injection 50 mcg (50 mcg Intravenous Given 05/22/24 1941)  prochlorperazine (COMPAZINE) injection 10 mg (10 mg Intravenous Given 05/22/24 1938)  diphenhydrAMINE  (BENADRYL) injection 25 mg (25 mg Intravenous Given 05/22/24 1938)  iohexol  (OMNIPAQUE ) 350 MG/ML injection 80 mL (80 mLs Intravenous Contrast Given 05/22/24 2042)    _______________________________________________________ ER Provider Called:      General Surgery Dr. Hildy Lowers They Recommend admit to medicine       ED Triage Vitals  Encounter Vitals Group     BP 05/22/24 1656 107/84     Systolic BP Percentile --      Diastolic BP Percentile --      Pulse Rate 05/22/24 1656 (!) 128     Resp 05/22/24 1656 18     Temp 05/22/24 1656 98 F (36.7 C)     Temp Source 05/22/24 1656 Oral     SpO2 05/22/24 1656 100 %     Weight --      Height --      Head Circumference --      Peak Flow --      Pain Score 05/22/24 1715 8     Pain Loc --      Pain Education --      Exclude from Growth Chart --   ZOXW(96)@     _________________________________________ Significant initial  Findings: Abnormal Labs Reviewed  COMPREHENSIVE METABOLIC PANEL WITH GFR - Abnormal; Notable for the following components:      Result Value   Potassium 5.8 (*)    CO2 19 (*)    Glucose, Bld 102 (*)    BUN 27  (*)    Creatinine, Ser 1.93 (*)    AST 45 (*)    ALT 47 (*)    Total Bilirubin 3.1 (*)    GFR, Estimated 39 (*)  All other components within normal limits  CBC WITH DIFFERENTIAL/PLATELET - Abnormal; Notable for the following components:   MCH 34.1 (*)    RDW 16.1 (*)    All other components within normal limits  BRAIN NATRIURETIC PEPTIDE - Abnormal; Notable for the following components:   B Natriuretic Peptide 1,241.9 (*)    All other components within normal limits  URINALYSIS, ROUTINE W REFLEX MICROSCOPIC - Abnormal; Notable for the following components:   Specific Gravity, Urine 1.043 (*)    Protein, ur 30 (*)    All other components within normal limits  TROPONIN I (HIGH SENSITIVITY) - Abnormal; Notable for the following components:   Troponin I (High Sensitivity) 61 (*)    All other components within normal limits  TROPONIN I (HIGH SENSITIVITY) - Abnormal; Notable for the following components:   Troponin I (High Sensitivity) 51 (*)    All other components within normal limits      _________________________ Troponin  ordered Cardiac Panel (last 3 results) Recent Labs    05/22/24 1700 05/22/24 1945  TROPONINIHS 61* 51*     ECG: Ordered Personally reviewed and interpreted by me showing: HR : 123 Rhythm:Sinus tachycardia Inferior infarct, acute (LCx) Anteroseptal infarct, age indeterminate Lateral leads are also involved Prolonged QT interval QTC 527  BNP (last 3 results) Recent Labs    03/17/24 2358 04/19/24 1606 05/22/24 1700  BNP 1,875.5* 1,490.0* 1,241.9*    The recent clinical data is shown below. Vitals:   05/22/24 1945 05/22/24 2015 05/22/24 2239 05/22/24 2239  BP: (!) 111/94 (!) 106/59  (!) 157/142  Pulse: (!) 122 (!) 116  (!) 120  Resp: (!) 26 (!) 0  15  Temp:   97.9 F (36.6 C) 97.9 F (36.6 C)  TempSrc:   Oral   SpO2: 100% 100%  100%    WBC     Component Value Date/Time   WBC 5.2 05/22/2024 1700   LYMPHSABS 1.0 05/22/2024 1700    LYMPHSABS 1.4 11/15/2022 1200   MONOABS 0.4 05/22/2024 1700   EOSABS 0.0 05/22/2024 1700   EOSABS 0.0 11/15/2022 1200   BASOSABS 0.0 05/22/2024 1700   BASOSABS 0.0 11/15/2022 1200    Procalcitonin   Ordered      UA   no evidence of UTI      Urine analysis:    Component Value Date/Time   COLORURINE YELLOW 05/22/2024 2252   APPEARANCEUR CLEAR 05/22/2024 2252   LABSPEC 1.043 (H) 05/22/2024 2252   PHURINE 6.0 05/22/2024 2252   GLUCOSEU NEGATIVE 05/22/2024 2252   HGBUR NEGATIVE 05/22/2024 2252   BILIRUBINUR NEGATIVE 05/22/2024 2252   BILIRUBINUR Negative 06/15/2016 1542   KETONESUR NEGATIVE 05/22/2024 2252   PROTEINUR 30 (A) 05/22/2024 2252   UROBILINOGEN 0.2 08/01/2021 0834   NITRITE NEGATIVE 05/22/2024 2252   LEUKOCYTESUR NEGATIVE 05/22/2024 2252    Results for orders placed or performed in visit on 04/14/24  C. trachomatis/N. gonorrhoeae RNA     Status: None   Collection Time: 04/14/24  2:36 AM   Specimen: Urine  Result Value Ref Range Status   C. trachomatis RNA, TMA NOT DETECTED NOT DETECTED Final   N. gonorrhoeae RNA, TMA NOT DETECTED NOT DETECTED Final    Comment: The analytical performance characteristics of this assay, when used to test SurePath(TM) specimens have been determined by [employer]. The modifications have not been cleared or approved by the FDA. This assay has been validated pursuant to the CLIA regulations and is used for clinical purposes. Aaron Aas  For additional information, please refer to https://education.[employer].com/faq/FAQ154 (This link is being provided for information/ educational purposes only.) .     ABX started Antibiotics Given (last 72 hours)     Date/Time Action Medication Dose Rate   05/22/24 2237 New Bag/Given   cefTRIAXone  (ROCEPHIN ) 2 g in sodium chloride  0.9 % 100 mL IVPB 2 g 200 mL/hr        No results found for the last 90 days.    _______     __________________________________________________________ Recent Labs  Lab 05/22/24 1700  NA 138  K 5.8*  CO2 19*  GLUCOSE 102*  BUN 27*  CREATININE 1.93*  CALCIUM  9.3    Cr   Up from baseline see below Lab Results  Component Value Date   CREATININE 1.93 (H) 05/22/2024   CREATININE 1.06 05/09/2024   CREATININE 1.45 (H) 05/08/2024    Recent Labs  Lab 05/22/24 1700  AST 45*  ALT 47*  ALKPHOS 78  BILITOT 3.1*  PROT 7.0  ALBUMIN  4.0   Lab Results  Component Value Date   CALCIUM  9.3 05/22/2024   PHOS 3.8 04/21/2024        Plt: Lab Results  Component Value Date   PLT 160 05/22/2024      Recent Labs  Lab 05/22/24 1700  WBC 5.2  NEUTROABS 3.7  HGB 15.4  HCT 45.1  MCV 100.0  PLT 160    HG/HCT  stable,     Component Value Date/Time   HGB 15.4 05/22/2024 1700   HGB 18.5 (H) 11/02/2023 1011   HCT 45.1 05/22/2024 1700   HCT 54.5 (H) 11/02/2023 1011   MCV 100.0 05/22/2024 1700   MCV 102 (H) 11/02/2023 1011       _______________________________________________ Hospitalist was called for admission for   Cholecystitis  AKI   The following Work up has been ordered so far:  Orders Placed This Encounter  Procedures   DG Chest Portable 1 View   CT Angio Chest PE W and/or Wo Contrast   CT ABDOMEN PELVIS W CONTRAST   US  Abdomen Limited RUQ (LIVER/GB)   Comprehensive metabolic panel   CBC with Differential   Brain natriuretic peptide   Urinalysis, Routine w reflex microscopic -Urine, Clean Catch   Rapid urine drug screen (hospital performed)   Consult to hospitalist   EKG 12-Lead     OTHER Significant initial  Findings:  labs showing:     DM  labs:  HbA1C: No results for input(s): "HGBA1C" in the last 8760 hours.     CBG (last 3)  No results for input(s): "GLUCAP" in the last 72 hours.        Cultures:    Component Value Date/Time   SDES  02/16/2024 2100    BLOOD LEFT ANTECUBITAL Performed at [facility], [address] [place], [place] [address]    SPECREQUEST  02/16/2024 2100    BOTTLES DRAWN AEROBIC AND ANAEROBIC Blood Culture adequate volume Performed at [facility], [address] [place], [place] [address]    CULT  02/16/2024 2100    NO GROWTH 5 DAYS Performed at [facility], [address] [place], [place] [address]    REPTSTATUS 02/22/2024 FINAL 02/16/2024 2100     Radiological Exams on Admission: US  Abdomen Limited RUQ (LIVER/GB) Result Date: 05/22/2024 CLINICAL DATA:  Cholecystitis. EXAM: ULTRASOUND ABDOMEN LIMITED RIGHT UPPER QUADRANT COMPARISON:  CT abdomen and pelvis 05/22/2024 FINDINGS: Gallbladder: There is gallbladder wall thickening measuring 4.8  mm. Pericholecystic is present. No calculi are seen. Sonographic Abigail Abler sign is negative. Common bile duct: Diameter: 4.1 mm Liver: No focal lesion identified. Mildly nodular contour. Within normal limits in parenchymal echogenicity. Portal vein is patent on color Doppler imaging with normal direction of blood flow towards the liver. Other: Trace free fluid in the right upper quadrant. IMPRESSION: 1. Gallbladder wall thickening and pericholecystic fluid. No calculi are seen. Sonographic Abigail Abler sign is negative. Findings are equivocal for acute cholecystitis. 2. Mildly nodular contour of the liver, which can be seen in the setting of cirrhosis. 3. Trace free fluid in the right upper quadrant. Electronically Signed   By: Tyron Gallon M.D.   On: 05/22/2024 22:20   CT Angio Chest PE W and/or Wo Contrast Result Date: 05/22/2024 CLINICAL DATA:  None EXAM: CT ANGIOGRAPHY CHEST CT ABDOMEN AND PELVIS WITH CONTRAST TECHNIQUE: Multidetector CT imaging of the chest was performed using the standard protocol during bolus administration of intravenous contrast. Multiplanar CT image reconstructions and MIPs were obtained to evaluate the vascular anatomy. Multidetector CT imaging of the abdomen and pelvis was performed using the  standard protocol during bolus administration of intravenous contrast. RADIATION DOSE REDUCTION: This exam was performed according to the departmental dose-optimization program which includes automated exposure control, adjustment of the mA and/or kV according to patient size and/or use of iterative reconstruction technique. CONTRAST:  80mL OMNIPAQUE  IOHEXOL  350 MG/ML SOLN COMPARISON:  None Available. FINDINGS: CTA CHEST FINDINGS Cardiovascular: Mild to moderate global cardiomegaly. There is adequate opacification of the pulmonary arterial tree. No intraluminal filling defect identified to suggest acute pulmonary embolism. The central pulmonary arteries are of normal caliber. Reflux of contrast into the hepatic venous systems in keeping with at least some degree of right heart failure. No pericardial effusion. Mild atherosclerotic calcification within the thoracic aorta. Left single lead transvenous pacemaker is in place with its lead within the right ventricle. Mediastinum/Nodes: No enlarged mediastinal, hilar, or axillary lymph nodes. Thyroid  gland, trachea, and esophagus demonstrate no significant findings. Lungs/Pleura: Lungs are clear. No pleural effusion or pneumothorax. Musculoskeletal: No chest wall abnormality. No acute or significant osseous findings. Review of the MIP images confirms the above findings. CT ABDOMEN and PELVIS FINDINGS Hepatobiliary: There is gallbladder wall thickening and pericholecystic edema, nonspecific in the setting trace perihepatic ascites and evidence of cardiogenic failure. No enhancing intrahepatic mass. No intra or extrahepatic biliary ductal dilation. Pancreas: Unremarkable Spleen: Unremarkable Adrenals/Urinary Tract: The adrenal glands are unremarkable. Striated bilateral cortical nephrogram has progressed slightly since prior examination suggesting an infectious or inflammatory process as can be seen with pyelonephritis and/or acute kidney injury. The kidneys are normal in  size and position. No hydronephrosis. No intrarenal or ureteral calculi. The bladder is unremarkable. Stomach/Bowel: Trace perihepatic ascites. Stomach, small bowel, and large bowel are unremarkable save for mild sigmoid diverticulosis. Appendix normal. No free intraperitoneal gas. Vascular/Lymphatic: No significant vascular findings are present. No enlarged abdominal or pelvic lymph nodes. Reproductive: Prostate is unremarkable. Other: No abdominal wall hernia. Mild diffuse subcutaneous body wall edema. Musculoskeletal: No acute or significant osseous findings. Review of the MIP images confirms the above findings. IMPRESSION: 1. No acute pulmonary embolism. 2. Mild to moderate global cardiomegaly. Reflux of contrast into the hepatic venous systems in keeping with at least some degree of right heart failure. 3. Striated bilateral cortical nephrogram has progressed slightly since prior examination suggesting an infectious or inflammatory process as can be seen with pyelonephritis and/or acute kidney injury. Correlation with urinalysis and renal  function tests are recommended. 4. Gallbladder wall thickening and pericholecystic edema, nonspecific in the setting of trace perihepatic ascites and evidence of cardiogenic failure. While this can simply represent a component of congestive hepatopathy, acute inflammatory conditions of the liver, biliary tree or gallbladder itself including acute cholecystitis, could appear in this fashion. Right upper quadrant sonography and or hepatic biliary scintigraphy may be helpful for further evaluation if there is clinical concern for acute cholecystitis. 5. Mild sigmoid diverticulosis. 6. Aortic atherosclerosis. Aortic Atherosclerosis (ICD10-I70.0). Electronically Signed   By: Worthy Heads M.D.   On: 05/22/2024 21:23   CT ABDOMEN PELVIS W CONTRAST Result Date: 05/22/2024 CLINICAL DATA:  None EXAM: CT ANGIOGRAPHY CHEST CT ABDOMEN AND PELVIS WITH CONTRAST TECHNIQUE: Multidetector  CT imaging of the chest was performed using the standard protocol during bolus administration of intravenous contrast. Multiplanar CT image reconstructions and MIPs were obtained to evaluate the vascular anatomy. Multidetector CT imaging of the abdomen and pelvis was performed using the standard protocol during bolus administration of intravenous contrast. RADIATION DOSE REDUCTION: This exam was performed according to the departmental dose-optimization program which includes automated exposure control, adjustment of the mA and/or kV according to patient size and/or use of iterative reconstruction technique. CONTRAST:  80mL OMNIPAQUE  IOHEXOL  350 MG/ML SOLN COMPARISON:  None Available. FINDINGS: CTA CHEST FINDINGS Cardiovascular: Mild to moderate global cardiomegaly. There is adequate opacification of the pulmonary arterial tree. No intraluminal filling defect identified to suggest acute pulmonary embolism. The central pulmonary arteries are of normal caliber. Reflux of contrast into the hepatic venous systems in keeping with at least some degree of right heart failure. No pericardial effusion. Mild atherosclerotic calcification within the thoracic aorta. Left single lead transvenous pacemaker is in place with its lead within the right ventricle. Mediastinum/Nodes: No enlarged mediastinal, hilar, or axillary lymph nodes. Thyroid  gland, trachea, and esophagus demonstrate no significant findings. Lungs/Pleura: Lungs are clear. No pleural effusion or pneumothorax. Musculoskeletal: No chest wall abnormality. No acute or significant osseous findings. Review of the MIP images confirms the above findings. CT ABDOMEN and PELVIS FINDINGS Hepatobiliary: There is gallbladder wall thickening and pericholecystic edema, nonspecific in the setting trace perihepatic ascites and evidence of cardiogenic failure. No enhancing intrahepatic mass. No intra or extrahepatic biliary ductal dilation. Pancreas: Unremarkable Spleen: Unremarkable  Adrenals/Urinary Tract: The adrenal glands are unremarkable. Striated bilateral cortical nephrogram has progressed slightly since prior examination suggesting an infectious or inflammatory process as can be seen with pyelonephritis and/or acute kidney injury. The kidneys are normal in size and position. No hydronephrosis. No intrarenal or ureteral calculi. The bladder is unremarkable. Stomach/Bowel: Trace perihepatic ascites. Stomach, small bowel, and large bowel are unremarkable save for mild sigmoid diverticulosis. Appendix normal. No free intraperitoneal gas. Vascular/Lymphatic: No significant vascular findings are present. No enlarged abdominal or pelvic lymph nodes. Reproductive: Prostate is unremarkable. Other: No abdominal wall hernia. Mild diffuse subcutaneous body wall edema. Musculoskeletal: No acute or significant osseous findings. Review of the MIP images confirms the above findings. IMPRESSION: 1. No acute pulmonary embolism. 2. Mild to moderate global cardiomegaly. Reflux of contrast into the hepatic venous systems in keeping with at least some degree of right heart failure. 3. Striated bilateral cortical nephrogram has progressed slightly since prior examination suggesting an infectious or inflammatory process as can be seen with pyelonephritis and/or acute kidney injury. Correlation with urinalysis and renal function tests are recommended. 4. Gallbladder wall thickening and pericholecystic edema, nonspecific in the setting of trace perihepatic ascites and evidence of cardiogenic failure.  While this can simply represent a component of congestive hepatopathy, acute inflammatory conditions of the liver, biliary tree or gallbladder itself including acute cholecystitis, could appear in this fashion. Right upper quadrant sonography and or hepatic biliary scintigraphy may be helpful for further evaluation if there is clinical concern for acute cholecystitis. 5. Mild sigmoid diverticulosis. 6. Aortic  atherosclerosis. Aortic Atherosclerosis (ICD10-I70.0). Electronically Signed   By: Worthy Heads M.D.   On: 05/22/2024 21:23   DG Chest Portable 1 View Result Date: 05/22/2024 CLINICAL DATA:  Chest pain EXAM: PORTABLE CHEST 1 VIEW COMPARISON:  04/19/2024 FINDINGS: Right ventricular ICD. Chin overlies the right apex and thoracic inlet. Midline trachea. Moderate cardiomegaly. No pleural effusion or pneumothorax. No congestive failure. Clear lungs. IMPRESSION: Cardiomegaly without congestive failure. Electronically Signed   By: Lore Rode M.D.   On: 05/22/2024 17:27   _______________________________________________________________________________________________________ Latest  Blood pressure (!) 157/142, pulse (!) 120, temperature 97.9 F (36.6 C), resp. rate 15, SpO2 100%.   Vitals  labs and radiology finding personally reviewed  Review of Systems:    Pertinent positives include: ***  Constitutional:  No weight loss, night sweats, Fevers, chills, fatigue, weight loss  HEENT:  No headaches, Difficulty swallowing,Tooth/dental problems,Sore throat,  No sneezing, itching, ear ache, nasal congestion, post nasal drip,  Cardio-vascular:  No chest pain, Orthopnea, PND, anasarca, dizziness, palpitations.no Bilateral lower extremity swelling  GI:  No heartburn, indigestion, abdominal pain, nausea, vomiting, diarrhea, change in bowel habits, loss of appetite, melena, blood in stool, hematemesis Resp:  no shortness of breath at rest. No dyspnea on exertion, No excess mucus, no productive cough, No non-productive cough, No coughing up of blood.No change in color of mucus.No wheezing. Skin:  no rash or lesions. No jaundice GU:  no dysuria, change in color of urine, no urgency or frequency. No straining to urinate.  No flank pain.  Musculoskeletal:  No joint pain or no joint swelling. No decreased range of motion. No back pain.  Psych:  No change in mood or affect. No depression or anxiety. No  memory loss.  Neuro: no localizing neurological complaints, no tingling, no weakness, no double vision, no gait abnormality, no slurred speech, no confusion  All systems reviewed and apart from HOPI all are negative _______________________________________________________________________________________________ Past Medical History:   Past Medical History:  Diagnosis Date   Active smoker    AICD (automatic cardioverter/defibrillator) present    Alcohol  abuse    Allergy July 2023   Anxiety    AR (allergic rhinitis)    Bipolar 1 disorder (HCC)    Cervical lymphadenitis 04/20/2021   CHF (congestive heart failure) (HCC)    Chronic bronchitis (HCC)    Chronic systolic heart failure (HCC)    Controlled substance agreement signed 10/22/2018   COPD (chronic obstructive pulmonary disease) (HCC)    COPD (chronic obstructive pulmonary disease) (HCC) 10/04/2015   Cough 12/31/2018   Crack cocaine use    Depression    Genital herpes    HIV (human immunodeficiency virus infection) (HCC) dx'd 08/1993   HLD (hyperlipidemia)    Hypertension    NICM (nonischemic cardiomyopathy) (HCC)    Echocardiogram 06/28/11: EF 30-35%, mild MR, mild LAE;  No CAD by coronary CT angiogram 3/12 at [facility]   NSVT (nonsustained ventricular tachycardia) (HCC)    PTSD (post-traumatic stress disorder)       Past Surgical History:  Procedure Laterality Date   CARDIAC DEFIBRILLATOR PLACEMENT  01/08/2018   ICD IMPLANT N/A 01/08/2018   Procedure:  ICD IMPLANT;  Surgeon: Lei Pump, MD;  Location: [facility] INVASIVE CV LAB;  Service: Cardiovascular;  Laterality: N/A;   RIGHT/LEFT HEART CATH AND CORONARY ANGIOGRAPHY N/A 01/03/2024   Procedure: RIGHT/LEFT HEART CATH AND CORONARY ANGIOGRAPHY;  Surgeon: Mardell Shade, MD;  Location: [facility] INVASIVE CV LAB;  Service: Cardiovascular;  Laterality: N/A;    Social History:  Ambulatory *** independently cane, walker  wheelchair bound, bed bound      reports that he has been smoking cigarettes. He has a 31.5 pack-year smoking history. He has never used smokeless tobacco. He reports that he does not currently use alcohol  after a past usage of about 6.0 standard drinks of alcohol  per week. He reports that he does not currently use drugs after having used the following drugs: "Crack" cocaine.     Family History: *** Family History  Problem Relation Age of Onset   Glaucoma Mother    Mental illness Mother    Vision loss Mother    Hypertension Father    CAD Father    Mental illness Father    Alcohol  abuse Father    Drug abuse Father    Heart attack Father 19   Early death Father    Heart disease Father    Alcohol  abuse Brother    Drug abuse Brother    Drug abuse Brother    ______________________________________________________________________________________________ Allergies: No Known Allergies   Prior to Admission medications   Medication Sig Start Date End Date Taking? Authorizing Provider  acetaminophen  (TYLENOL ) 325 MG tablet Take 2 tablets (650 mg total) by mouth every 6 (six) hours as needed for mild pain (pain score 1-3) (or Fever >/= 101). 03/07/24   Barbee Lew, MD  albuterol  (VENTOLIN  HFA) 108 [phone] Base) MCG/ACT inhaler Inhale 2 puffs into the lungs every 6 (six) hours as needed for wheezing or shortness of breath. 07/30/23   Vernell Goldsmith, MD  amiodarone  (PACERONE ) 200 MG tablet Take 1 tablet (200 mg total) by mouth daily. 03/28/24   Camnitz, Babetta Lesch, MD  aspirin  EC 81 MG tablet Take 81 mg by mouth in the morning. Swallow whole.    [provider]  bictegravir-emtricitabine -tenofovir  AF (BIKTARVY ) 50-200-25 MG TABS tablet Take 1 tablet by mouth daily. Patient taking differently: Take 1 tablet by mouth in the morning. 08/15/23   Charolette Copier, MD  finasteride  (PROSCAR ) 5 MG tablet TAKE 1 TABLET(5 MG) BY MOUTH DAILY Patient taking differently: Take 5 mg by mouth in the morning. 03/15/23    Vernell Goldsmith, MD  LORazepam  (ATIVAN ) 1 MG tablet Take 1 tablet (1 mg total) by mouth every 8 (eight) hours as needed for anxiety. 05/09/24   Armenta Landau, MD  Melatonin 10 MG TABS Take 10 mg by mouth at bedtime.    [provider]  mexiletine (MEXITIL ) 250 MG capsule Take 1 capsule (250 mg total) by mouth every 12 (twelve) hours. 02/08/24   Debria Fang, PA-C  midodrine  (PROAMATINE ) 10 MG tablet Take 3 tablets (30 mg total) by mouth 3 (three) times daily with meals. 04/28/24   Ozell Blunt, MD  morphine  10 MG/5ML solution Take 1.3 mLs (2.6 mg total) by mouth every 6 (six) hours as needed for severe pain (pain score 7-10). 04/28/24   Ozell Blunt, MD  ondansetron  (ZOFRAN ) 4 MG tablet Take 1 tablet (4 mg total) by mouth every 8 (eight) hours as needed for nausea or vomiting. 03/12/24   Lawana Pray, Babetta Lesch, MD  pantoprazole  (PROTONIX ) 40 MG tablet Take 1 tablet (40 mg total) by mouth daily at 6 (six) AM. 05/10/24   Armenta Landau, MD  polyethylene glycol (MIRALAX  / GLYCOLAX ) 17 g packet Take 17 g by mouth daily. 02/09/24   Debria Fang, PA-C  potassium chloride  SA (KLOR-CON  M) 20 MEQ tablet Take 2 tablets (40 mEq total) by mouth daily. 05/10/24   Armenta Landau, MD  senna (SENOKOT) 8.6 MG TABS tablet Take 2 tablets (17.2 mg total) by mouth daily as needed for mild constipation. 04/28/24   Ozell Blunt, MD  SYMBICORT 80-4.5 MCG/ACT inhaler Inhale 2 puffs into the lungs in the morning and at bedtime. 03/06/24   [provider]  tamsulosin  (FLOMAX ) 0.4 MG CAPS capsule Take 2 capsules (0.8 mg total) by mouth daily. 04/29/24   Ozell Blunt, MD  torsemide  (DEMADEX ) 20 MG tablet Take 1 tablet (20 mg total) by mouth 2 (two) times daily. 05/09/24 06/08/24  Armenta Landau, MD  traZODone  (DESYREL ) 150 MG tablet Take 150 mg by mouth at bedtime as needed for sleep. 04/09/24   [provider]  valACYclovir  (VALTREX ) 500 MG tablet Take 1 tablet (500 mg total) by  mouth daily. Patient taking differently: Take 500 mg by mouth in the morning. 11/15/22   Vernell Goldsmith, MD    ___________________________________________________________________________________________________ Physical Exam:    05/22/2024   10:39 PM 05/22/2024    8:15 PM 05/22/2024    7:45 PM  Vitals with BMI  Systolic 157 106 161  Diastolic 142 59 94  Pulse 120 096 122     1. General:  in No ***Acute distress***increased work of breathing ***complaining of severe pain****agitated * Chronically ill *well *cachectic *toxic acutely ill -appearing 2. Psychological: Alert and *** Oriented 3. Head/ENT:   Moist *** Dry Mucous Membranes                          Head Non traumatic, neck supple                          Normal *** Poor Dentition 4. SKIN: normal *** decreased Skin turgor,  Skin clean Dry and intact no rash    5. Heart: Regular rate and rhythm no*** Murmur, no Rub or gallop 6. Lungs: ***Clear to auscultation bilaterally, no wheezes or crackles   7. Abdomen: Soft, ***non-tender, Non distended *** obese ***bowel sounds present 8. Lower extremities: no clubbing, cyanosis, no ***edema 9. Neurologically Grossly intact, moving all 4 extremities equally *** strength 5 out of 5 in all 4 extremities cranial nerves II through XII intact 10. MSK: Normal range of motion    Chart has been reviewed  ______________________________________________________________________________________________  Assessment/Plan 63 y.o. male with medical history significant of end-stage heart failure, hypertension, hyperlipidemia, HIV, COPD, CKD, anxiety, depression, and bipolar disorder presents with chest pain and headaches.     Admitted for  Cholecystitis  AKI    Present on Admission:  Acute cholecystitis  Chest pain  End-stage systolic heart failure, chronic (HCC)  Paroxysmal atrial fibrillation (HCC)  HIV disease (HCC)  COPD (chronic obstructive pulmonary disease) (HCC)  Hyperkalemia   Polycythemia  QT prolongation  AKI (acute kidney injury) (HCC)     Chest pain Mild epigastric pain.  Troponin downtrending.  Continue to monitor patient is on hospice with recurrent history of chest pain controlled with morphine   End-stage systolic heart failure, chronic (HCC) Comfort  measures continue Also continue midodrine  30 mg 3 times daily amiodarone  200 mg daily aspirin  81 mg daily Resume torsemide  when able  Paroxysmal atrial fibrillation (HCC) On amiodarone  200 mg daily and baby aspirin  patient is on hospice no longer on anticoagulation  HIV disease (HCC) Continue home medications.  Check CD4 and HIV viral counts  COPD (chronic obstructive pulmonary disease) (HCC) Chronic stable continue home medications  Hyperkalemia Repeat labs possibly hemolyzed result  Acute cholecystitis Imaging equivocal.  Slight elevated LFTs but that is also in the setting of possible cirrhosis likely cardiogenic edema. Patient currently on antibiotics Rocephin  and Flagyl  will continue General Surgery feels that he is not a candidate for operative intervention if symptoms persist could consider IR consult for gallbladder drain Appreciate official general surgery consult in a.m.  Polycythemia Hemoglobin stable  QT prolongation - will monitor on tele avoid QT prolonging medications, rehydrate correct electrolytes   AKI (acute kidney injury) (HCC) In the persistent nausea vomiting. Hold torsemide  obtain electrolytes    Other plan as per orders.  DVT prophylaxis:  SCD       Code Status:    Code Status: Prior FULL CODE *** DNR/DNI ***comfort care as per patient ***family  I had personally discussed CODE STATUS with patient and family*  ACP *** none has been reviewed ***   Family Communication:   Family not at  Bedside  plan of care was discussed on the phone with *** Son, Daughter, Wife, Husband, Sister, Brother , father, mother  Diet    Disposition Plan:   *** likely will  need placement for rehabilitation                          Back to current facility when stable                            To home once workup is complete and patient is stable  ***Following barriers for discharge:                             Chest pain *** Stroke *** Syncope ***work up is complete                            Electrolytes corrected                               Anemia corrected h/H stable                             Pain controlled with PO medications                               Afebrile, white count improving able to transition to PO antibiotics                             Will need to be able to tolerate PO                            Will likely need home health, home O2, set up  Will need consultants to evaluate patient prior to discharge                           Work of breathing improves       Consult Orders  (From admission, onward)           Start     Ordered   05/22/24 2228  Consult to hospitalist  Once       Provider:  (Not yet assigned)  Question Answer Comment  Place call to: [facility]   Reason for Consult Admit      05/22/24 2228                              Palliative care    consulted                  Consults called: general surgery   Admission status:  ED Disposition     ED Disposition  Admit   Condition  --   Comment  The patient appears reasonably stabilized for admission considering the current resources, flow, and capabilities available in the ED at this time, and I doubt any other [facility] requiring further screening and/or treatment in the ED prior to admission is  present.           inpatient     I Expect 2 midnight stay secondary to severity of patient's current illness need for inpatient interventions justified by the following:  hemodynamic instability despite optimal treatment (tachycardia  )   Severe lab/radiological/exam abnormalities including:    Cholecystitis    and extensive  comorbidities including:  Chronic pain  CHF   That are currently affecting medical management.   I expect  patient to be hospitalized for 2 midnights requiring inpatient medical care.  Patient is at high risk for adverse outcome (such as loss of life or disability) if not treated.  Indication for inpatient stay as follows:   Hemodynamic instability despite maximal medical therapy,  severe pain requiring acute inpatient management,  inability to maintain oral hydration    Need for IV antibiotics, IV fluids,, IV pain medications,  Frequent labs    Level of care  progressive         Darl Kuss 05/22/2024, 11:06 PM ***  [facility]     after 2 AM please page floor coverage   If 7AM-7PM, please contact the day team taking care of the patient using [email or web address]

## 2024-05-22 NOTE — Assessment & Plan Note (Signed)
 Chronic stable continue home medications ?

## 2024-05-22 NOTE — Assessment & Plan Note (Signed)
 Repeat labs possibly hemolyzed result

## 2024-05-23 ENCOUNTER — Other Ambulatory Visit: Payer: Self-pay

## 2024-05-23 DIAGNOSIS — B20 Human immunodeficiency virus [HIV] disease: Secondary | ICD-10-CM | POA: Diagnosis present

## 2024-05-23 DIAGNOSIS — R338 Other retention of urine: Secondary | ICD-10-CM | POA: Diagnosis present

## 2024-05-23 DIAGNOSIS — E875 Hyperkalemia: Secondary | ICD-10-CM | POA: Diagnosis present

## 2024-05-23 DIAGNOSIS — I5043 Acute on chronic combined systolic (congestive) and diastolic (congestive) heart failure: Secondary | ICD-10-CM | POA: Diagnosis present

## 2024-05-23 DIAGNOSIS — Z515 Encounter for palliative care: Secondary | ICD-10-CM | POA: Diagnosis not present

## 2024-05-23 DIAGNOSIS — F17210 Nicotine dependence, cigarettes, uncomplicated: Secondary | ICD-10-CM | POA: Diagnosis present

## 2024-05-23 DIAGNOSIS — F419 Anxiety disorder, unspecified: Secondary | ICD-10-CM | POA: Diagnosis present

## 2024-05-23 DIAGNOSIS — Z7982 Long term (current) use of aspirin: Secondary | ICD-10-CM | POA: Diagnosis not present

## 2024-05-23 DIAGNOSIS — Z7189 Other specified counseling: Secondary | ICD-10-CM | POA: Diagnosis not present

## 2024-05-23 DIAGNOSIS — E876 Hypokalemia: Secondary | ICD-10-CM | POA: Diagnosis present

## 2024-05-23 DIAGNOSIS — I428 Other cardiomyopathies: Secondary | ICD-10-CM | POA: Diagnosis present

## 2024-05-23 DIAGNOSIS — Z79899 Other long term (current) drug therapy: Secondary | ICD-10-CM | POA: Diagnosis not present

## 2024-05-23 DIAGNOSIS — Z7951 Long term (current) use of inhaled steroids: Secondary | ICD-10-CM | POA: Diagnosis not present

## 2024-05-23 DIAGNOSIS — R1013 Epigastric pain: Secondary | ICD-10-CM | POA: Diagnosis present

## 2024-05-23 DIAGNOSIS — Z66 Do not resuscitate: Secondary | ICD-10-CM | POA: Diagnosis present

## 2024-05-23 DIAGNOSIS — N179 Acute kidney failure, unspecified: Secondary | ICD-10-CM | POA: Diagnosis present

## 2024-05-23 DIAGNOSIS — R0602 Shortness of breath: Secondary | ICD-10-CM | POA: Diagnosis not present

## 2024-05-23 DIAGNOSIS — I4892 Unspecified atrial flutter: Secondary | ICD-10-CM | POA: Diagnosis present

## 2024-05-23 DIAGNOSIS — E440 Moderate protein-calorie malnutrition: Secondary | ICD-10-CM | POA: Diagnosis present

## 2024-05-23 DIAGNOSIS — R52 Pain, unspecified: Secondary | ICD-10-CM | POA: Diagnosis not present

## 2024-05-23 DIAGNOSIS — J4489 Other specified chronic obstructive pulmonary disease: Secondary | ICD-10-CM | POA: Diagnosis present

## 2024-05-23 DIAGNOSIS — E785 Hyperlipidemia, unspecified: Secondary | ICD-10-CM | POA: Diagnosis present

## 2024-05-23 DIAGNOSIS — I480 Paroxysmal atrial fibrillation: Secondary | ICD-10-CM | POA: Diagnosis present

## 2024-05-23 DIAGNOSIS — I5022 Chronic systolic (congestive) heart failure: Secondary | ICD-10-CM | POA: Diagnosis not present

## 2024-05-23 DIAGNOSIS — D751 Secondary polycythemia: Secondary | ICD-10-CM | POA: Diagnosis present

## 2024-05-23 DIAGNOSIS — E861 Hypovolemia: Secondary | ICD-10-CM | POA: Diagnosis present

## 2024-05-23 DIAGNOSIS — I5084 End stage heart failure: Secondary | ICD-10-CM | POA: Diagnosis present

## 2024-05-23 DIAGNOSIS — N1831 Chronic kidney disease, stage 3a: Secondary | ICD-10-CM | POA: Diagnosis present

## 2024-05-23 DIAGNOSIS — I130 Hypertensive heart and chronic kidney disease with heart failure and stage 1 through stage 4 chronic kidney disease, or unspecified chronic kidney disease: Secondary | ICD-10-CM | POA: Diagnosis present

## 2024-05-23 DIAGNOSIS — K810 Acute cholecystitis: Secondary | ICD-10-CM | POA: Diagnosis present

## 2024-05-23 DIAGNOSIS — F319 Bipolar disorder, unspecified: Secondary | ICD-10-CM | POA: Diagnosis present

## 2024-05-23 LAB — CD4/CD8 (T-HELPER/T-SUPPRESSOR CELL)
CD4 absolute: 382 /uL — ABNORMAL LOW (ref 400–1790)
CD4%: 48.8 % (ref 33–65)
CD8 T Cell Abs: 268 /uL (ref 190–1000)
CD8tox: 34.31 % (ref 12–40)
Ratio: 1.42 (ref 1.0–3.0)
Total lymphocyte count: 782 /uL — ABNORMAL LOW (ref 1000–4000)

## 2024-05-23 LAB — CBC
HCT: 40.4 % (ref 39.0–52.0)
Hemoglobin: 14 g/dL (ref 13.0–17.0)
MCH: 34.2 pg — ABNORMAL HIGH (ref 26.0–34.0)
MCHC: 34.7 g/dL (ref 30.0–36.0)
MCV: 98.8 fL (ref 80.0–100.0)
Platelets: 122 10*3/uL — ABNORMAL LOW (ref 150–400)
RBC: 4.09 MIL/uL — ABNORMAL LOW (ref 4.22–5.81)
RDW: 15.8 % — ABNORMAL HIGH (ref 11.5–15.5)
WBC: 3.1 10*3/uL — ABNORMAL LOW (ref 4.0–10.5)
nRBC: 0 % (ref 0.0–0.2)

## 2024-05-23 LAB — BLOOD GAS, VENOUS
Acid-base deficit: 10.5 mmol/L — ABNORMAL HIGH (ref 0.0–2.0)
Bicarbonate: 13 mmol/L — ABNORMAL LOW (ref 20.0–28.0)
Drawn by: 64810
O2 Saturation: 79.9 %
Patient temperature: 37
pCO2, Ven: 22 mmHg — ABNORMAL LOW (ref 44–60)
pH, Ven: 7.38 (ref 7.25–7.43)
pO2, Ven: 49 mmHg — ABNORMAL HIGH (ref 32–45)

## 2024-05-23 LAB — COMPREHENSIVE METABOLIC PANEL WITH GFR
ALT: 45 U/L — ABNORMAL HIGH (ref 0–44)
ALT: 45 U/L — ABNORMAL HIGH (ref 0–44)
AST: 38 U/L (ref 15–41)
AST: 39 U/L (ref 15–41)
Albumin: 3.4 g/dL — ABNORMAL LOW (ref 3.5–5.0)
Albumin: 3.7 g/dL (ref 3.5–5.0)
Alkaline Phosphatase: 70 U/L (ref 38–126)
Alkaline Phosphatase: 75 U/L (ref 38–126)
Anion gap: 12 (ref 5–15)
Anion gap: 13 (ref 5–15)
BUN: 26 mg/dL — ABNORMAL HIGH (ref 8–23)
BUN: 26 mg/dL — ABNORMAL HIGH (ref 8–23)
CO2: 17 mmol/L — ABNORMAL LOW (ref 22–32)
CO2: 19 mmol/L — ABNORMAL LOW (ref 22–32)
Calcium: 8.8 mg/dL — ABNORMAL LOW (ref 8.9–10.3)
Calcium: 9.2 mg/dL (ref 8.9–10.3)
Chloride: 106 mmol/L (ref 98–111)
Chloride: 112 mmol/L — ABNORMAL HIGH (ref 98–111)
Creatinine, Ser: 1.68 mg/dL — ABNORMAL HIGH (ref 0.61–1.24)
Creatinine, Ser: 1.76 mg/dL — ABNORMAL HIGH (ref 0.61–1.24)
GFR, Estimated: 43 mL/min — ABNORMAL LOW (ref >60)
GFR, Estimated: 46 mL/min — ABNORMAL LOW (ref >60)
Glucose, Bld: 73 mg/dL (ref 70–99)
Glucose, Bld: 79 mg/dL (ref 70–99)
Potassium: 4.2 mmol/L (ref 3.5–5.1)
Potassium: 4.5 mmol/L (ref 3.5–5.1)
Sodium: 138 mmol/L (ref 135–145)
Sodium: 141 mmol/L (ref 135–145)
Total Bilirubin: 2.4 mg/dL — ABNORMAL HIGH (ref 0.0–1.2)
Total Bilirubin: 2.6 mg/dL — ABNORMAL HIGH (ref 0.0–1.2)
Total Protein: 6 g/dL — ABNORMAL LOW (ref 6.5–8.1)
Total Protein: 6.2 g/dL — ABNORMAL LOW (ref 6.5–8.1)

## 2024-05-23 LAB — LACTIC ACID, PLASMA
Lactic Acid, Venous: 1.8 mmol/L (ref 0.5–1.9)
Lactic Acid, Venous: 1.9 mmol/L (ref 0.5–1.9)

## 2024-05-23 LAB — PROTIME-INR
INR: 1.3 — ABNORMAL HIGH (ref 0.8–1.2)
Prothrombin Time: 16.4 s — ABNORMAL HIGH (ref 11.4–15.2)

## 2024-05-23 LAB — OSMOLALITY, URINE: Osmolality, Ur: 383 mosm/kg (ref 300–900)

## 2024-05-23 LAB — TROPONIN I (HIGH SENSITIVITY)
Troponin I (High Sensitivity): 42 ng/L — ABNORMAL HIGH (ref <18)
Troponin I (High Sensitivity): 46 ng/L — ABNORMAL HIGH (ref <18)

## 2024-05-23 LAB — PHOSPHORUS
Phosphorus: 3.4 mg/dL (ref 2.5–4.6)
Phosphorus: 3.7 mg/dL (ref 2.5–4.6)

## 2024-05-23 LAB — MAGNESIUM
Magnesium: 1.9 mg/dL (ref 1.7–2.4)
Magnesium: 2 mg/dL (ref 1.7–2.4)

## 2024-05-23 LAB — CK: Total CK: 67 U/L (ref 49–397)

## 2024-05-23 LAB — TSH: TSH: 1.054 u[IU]/mL (ref 0.350–4.500)

## 2024-05-23 LAB — OSMOLALITY: Osmolality: 295 mosm/kg (ref 275–295)

## 2024-05-23 LAB — CREATININE, URINE, RANDOM: Creatinine, Urine: 77 mg/dL

## 2024-05-23 LAB — PREALBUMIN: Prealbumin: 28 mg/dL (ref 18–38)

## 2024-05-23 LAB — SODIUM, URINE, RANDOM: Sodium, Ur: <10 mmol/L

## 2024-05-23 MED ORDER — TAMSULOSIN HCL 0.4 MG PO CAPS
0.4000 mg | ORAL_CAPSULE | Freq: Every day | ORAL | Status: DC
  Administered 2024-05-23 – 2024-05-28 (×6): 0.4 mg via ORAL
  Filled 2024-05-23 (×6): qty 1

## 2024-05-23 MED ORDER — BOOST / RESOURCE BREEZE PO LIQD CUSTOM
1.0000 | Freq: Three times a day (TID) | ORAL | Status: DC
  Administered 2024-05-23 – 2024-05-25 (×4): 1 via ORAL

## 2024-05-23 MED ORDER — PROMETHAZINE (PHENERGAN) 6.25MG IN NS 50ML IVPB
6.2500 mg | Freq: Four times a day (QID) | INTRAVENOUS | Status: DC | PRN
  Administered 2024-05-23 – 2024-05-27 (×5): 6.25 mg via INTRAVENOUS
  Filled 2024-05-23: qty 50
  Filled 2024-05-23 (×5): qty 6.25
  Filled 2024-05-23: qty 50

## 2024-05-23 MED ORDER — LORAZEPAM 1 MG PO TABS
1.0000 mg | ORAL_TABLET | Freq: Three times a day (TID) | ORAL | Status: DC | PRN
  Administered 2024-05-23 – 2024-05-29 (×11): 1 mg via ORAL
  Filled 2024-05-23 (×13): qty 1

## 2024-05-23 MED ORDER — POLYETHYLENE GLYCOL 3350 17 G PO PACK: 17.0000 g | PACK | Freq: Every day | ORAL | Status: DC | PRN

## 2024-05-23 MED ORDER — SODIUM CHLORIDE 0.9 % IV SOLN
2.0000 g | INTRAVENOUS | Status: DC
  Administered 2024-05-23 – 2024-05-26 (×4): 2 g via INTRAVENOUS
  Filled 2024-05-23 (×4): qty 20

## 2024-05-23 MED ORDER — BICTEGRAVIR-EMTRICITAB-TENOFOV 50-200-25 MG PO TABS
1.0000 | ORAL_TABLET | Freq: Every day | ORAL | Status: DC
  Administered 2024-05-23 – 2024-05-29 (×7): 1 via ORAL
  Filled 2024-05-23 (×8): qty 1

## 2024-05-23 MED ORDER — MORPHINE SULFATE (PF) 2 MG/ML IV SOLN
2.0000 mg | INTRAVENOUS | Status: DC | PRN
  Administered 2024-05-23 (×2): 2 mg via INTRAVENOUS
  Filled 2024-05-23 (×2): qty 1

## 2024-05-23 MED ORDER — FLUTICASONE FUROATE-VILANTEROL 100-25 MCG/ACT IN AEPB
1.0000 | INHALATION_SPRAY | Freq: Every day | RESPIRATORY_TRACT | Status: DC
  Administered 2024-05-24 – 2024-05-29 (×3): 1 via RESPIRATORY_TRACT
  Filled 2024-05-23: qty 28

## 2024-05-23 MED ORDER — ACETAMINOPHEN 650 MG RE SUPP: 650.0000 mg | Freq: Four times a day (QID) | RECTAL | Status: DC | PRN

## 2024-05-23 MED ORDER — ALBUTEROL SULFATE (2.5 MG/3ML) 0.083% IN NEBU: 2.5000 mg | INHALATION_SOLUTION | RESPIRATORY_TRACT | Status: DC | PRN

## 2024-05-23 MED ORDER — MEXILETINE HCL 250 MG PO CAPS
250.0000 mg | ORAL_CAPSULE | Freq: Two times a day (BID) | ORAL | Status: DC
  Administered 2024-05-23 – 2024-05-29 (×15): 250 mg via ORAL
  Filled 2024-05-23 (×17): qty 1

## 2024-05-23 MED ORDER — METRONIDAZOLE 500 MG/100ML IV SOLN
500.0000 mg | Freq: Two times a day (BID) | INTRAVENOUS | Status: DC
  Administered 2024-05-23 – 2024-05-26 (×8): 500 mg via INTRAVENOUS
  Filled 2024-05-23 (×8): qty 100

## 2024-05-23 MED ORDER — SODIUM CHLORIDE 0.9 % IV SOLN: INTRAVENOUS | Status: AC

## 2024-05-23 MED ORDER — AMIODARONE HCL 200 MG PO TABS
200.0000 mg | ORAL_TABLET | Freq: Every day | ORAL | Status: DC
  Administered 2024-05-23 – 2024-05-26 (×4): 200 mg via ORAL
  Filled 2024-05-23 (×5): qty 1

## 2024-05-23 MED ORDER — MORPHINE SULFATE (CONCENTRATE) 10 MG /0.5 ML PO SOLN
10.0000 mg | ORAL | Status: DC | PRN
  Administered 2024-05-23 – 2024-05-24 (×2): 10 mg via ORAL
  Filled 2024-05-23 (×2): qty 0.5

## 2024-05-23 MED ORDER — SODIUM CHLORIDE 0.9 % IV SOLN
12.5000 mg | Freq: Four times a day (QID) | INTRAVENOUS | Status: DC | PRN
  Filled 2024-05-23: qty 0.5

## 2024-05-23 MED ORDER — HYDROCODONE-ACETAMINOPHEN 5-325 MG PO TABS: 1.0000 | ORAL_TABLET | ORAL | Status: DC | PRN

## 2024-05-23 MED ORDER — PANTOPRAZOLE SODIUM 40 MG PO TBEC
40.0000 mg | DELAYED_RELEASE_TABLET | Freq: Every day | ORAL | Status: DC
  Administered 2024-05-23 – 2024-05-29 (×7): 40 mg via ORAL
  Filled 2024-05-23 (×8): qty 1

## 2024-05-23 MED ORDER — FENTANYL CITRATE PF 50 MCG/ML IJ SOSY: 50.0000 ug | PREFILLED_SYRINGE | INTRAMUSCULAR | Status: DC | PRN

## 2024-05-23 MED ORDER — MORPHINE SULFATE (PF) 4 MG/ML IV SOLN
4.0000 mg | INTRAVENOUS | Status: DC | PRN
  Administered 2024-05-23 (×3): 4 mg via INTRAVENOUS
  Filled 2024-05-23 (×3): qty 1

## 2024-05-23 MED ORDER — CARMEX CLASSIC LIP BALM EX OINT
TOPICAL_OINTMENT | CUTANEOUS | Status: DC | PRN
  Filled 2024-05-23: qty 10

## 2024-05-23 MED ORDER — FINASTERIDE 5 MG PO TABS
5.0000 mg | ORAL_TABLET | Freq: Every morning | ORAL | Status: DC
  Administered 2024-05-23 – 2024-05-29 (×7): 5 mg via ORAL
  Filled 2024-05-23 (×7): qty 1

## 2024-05-23 MED ORDER — TRAZODONE HCL 50 MG PO TABS
150.0000 mg | ORAL_TABLET | Freq: Every evening | ORAL | Status: DC | PRN
  Administered 2024-05-24 – 2024-05-28 (×5): 150 mg via ORAL
  Filled 2024-05-23 (×5): qty 1

## 2024-05-23 MED ORDER — METOPROLOL TARTRATE 5 MG/5ML IV SOLN: 5.0000 mg | Freq: Once | INTRAVENOUS | Status: DC

## 2024-05-23 MED ORDER — ACETAMINOPHEN 325 MG PO TABS
650.0000 mg | ORAL_TABLET | Freq: Four times a day (QID) | ORAL | Status: DC | PRN
  Administered 2024-05-23 – 2024-05-28 (×10): 650 mg via ORAL
  Filled 2024-05-23 (×10): qty 2

## 2024-05-23 MED ORDER — ONDANSETRON HCL 4 MG/2ML IJ SOLN
4.0000 mg | Freq: Four times a day (QID) | INTRAMUSCULAR | Status: DC | PRN
  Administered 2024-05-23 – 2024-05-28 (×12): 4 mg via INTRAVENOUS
  Filled 2024-05-23 (×12): qty 2

## 2024-05-23 MED ORDER — VALACYCLOVIR HCL 500 MG PO TABS
500.0000 mg | ORAL_TABLET | Freq: Every morning | ORAL | Status: DC
  Administered 2024-05-23 – 2024-05-29 (×7): 500 mg via ORAL
  Filled 2024-05-23 (×7): qty 1

## 2024-05-23 NOTE — Progress Notes (Signed)
 PROGRESS NOTE    Ryan Long  ZOX:[id number] DOB: 1961/06/17 DOA: 05/22/2024 PCP: Tretha Fu, MD    Brief Narrative:  63 year old gentleman with history of end-stage heart failure with known ejection fraction 20%, hypertension, hyperlipidemia, depression, bipolar disorder and HIV with recurrent hospitalization and discharged to long-term care facility with hospice on 5/23, was suffering from epigastric and chest pain for last 1 week revoked his hospice status, left the nursing home and asked his previous landlord to bring him to ER.  He has been suffering from right upper quadrant pain and intermittent chest pain. At the emergency room, complaining of chest pain relieved with morphine .  Tachycardic.  On room air.  Right upper quadrant ultrasound consistent with pericholecystic fluid without gallbladder stone.  Seen by surgery.  Possible acute cholecystitis, conservative management advised as patient is not a surgical candidate given his poor cardiovascular status.  Subjective: Patient seen and examined.  Still in the emergency room.  He was able to keep up conversation.  Patient tells me that he requested to be transferred to hospital for treatment of his abdominal pain and chest pain but hospice would not bring him to hospital so he revoked his hospice. During and after my exam, he had multiple episodes of midsternal chest pain improved with morphine .  No evidence of active myocardial infarction.  Tolerating liquids.  Goal of care discussion as below.   Assessment & Plan:   Suspected acute cholecystitis: Suspected with right upper quadrant pain and presence of pericholecystic fluid on ultrasound.  Currently fairly stable.  Patient with severe clinical morbidities not favorable for surgical treatment.  Seen by surgery. Continue IV fluids judiciously, continue broad-spectrum antibiotics today.  If severe pain, fever or distention will ask IR to place percutaneous  drain.  End-stage systolic heart failure: Currently without evidence of fluid overload.  Careful administration of fluid.  Will encourage oral liquid today. Goal of care discussion below.  Chest pain: Continues to have epigastric and midsternal chest pain.  Troponin nonischemic.  Very high risk of cardiac morbidity.  Not a candidate for ischemic evaluation. Continue Maxijul, midodrine . Adequate oral and IV opiates for pain relief.  Paroxysmal A-fib: Sinus rhythm.  On amiodarone .  Not on anticoagulation.  HIV disease: Well-controlled.  Currently on Biktarvy .  AKI: Holding torsemide .  Recheck levels tomorrow morning.  Goal of care: Patient revoked hospice to get symptom relief and treated for infection.  We discussed in detail especially about his cardiac issues, severe congestive heart failure and high mortality.  Patient understands.  Currently wants to be treated for his gallbladder infection and get symptom relief.  Decided to continue antibiotics. CODE STATUS: A detailed explanation of CPR, mechanical ventilation and poor outcome in case of already present severe underlying illness and poor heart function.  We discussed about ongoing treatment to continue however not to do CPR and put him on ventilator if it comes to that point.  Patient was appreciative of discussion and agreed to DNR/DNI but to continue intervention otherwise. Consult palliative care team to coordinate care.   DVT prophylaxis: SCDs Start: 05/23/24 0206   Code Status: DNR/DNI Family Communication: None at the bedside Disposition Plan: Status is: Inpatient Remains inpatient appropriate because: IV antibiotics, significant pain     Consultants:  General Surgery Palliative care  Procedures:  None  Antimicrobials:  Rocephin  and Flagyl  6/5---     Objective: Vitals:   05/23/24 0600 05/23/24 0801 05/23/24 1030 05/23/24 1116  BP: 114/86 99/78 106/86   Pulse:  61 (!) 121 (!) 122   Resp: (!) 22 16 (!) 25    Temp: 97.8 F (36.6 C) 97.8 F (36.6 C)  97.6 F (36.4 C)  TempSrc:    Oral  SpO2: 97% 99% 93%     Intake/Output Summary (Last 24 hours) at 05/23/2024 1333 Last data filed at 05/23/2024 1139 Gross per 24 hour  Intake 559.73 ml  Output --  Net 559.73 ml   There were no vitals filed for this visit.  Examination:  General exam: Appears calm and comfortable.  Occasional episodes of pain and mild discomfort. Respiratory system: Clear to auscultation. Respiratory effort normal.  On room air. Cardiovascular system: S1 & S2 heard, RRR.  No pedal edema. Gastrointestinal system: Soft.  Mild tenderness right upper quadrant on deep palpation without rigidity or guarding.  Murphy sign negative.  Bowel sounds present. Central nervous system: Alert and oriented. No focal neurological deficits. Extremities: Symmetric 5 x 5 power.    Data Reviewed: I have personally reviewed following labs and imaging studies  CBC: Recent Labs  Lab 05/22/24 1700 05/23/24 0509  WBC 5.2 3.1*  NEUTROABS 3.7  --   HGB 15.4 14.0  HCT 45.1 40.4  MCV 100.0 98.8  PLT 160 122*   Basic Metabolic Panel: Recent Labs  Lab 05/22/24 1700 05/22/24 2358 05/23/24 0509  NA 138 138 141  K 5.8* 4.5 4.2  CL 106 106 112*  CO2 19* 19* 17*  GLUCOSE 102* 79 73  BUN 27* 26* 26*  CREATININE 1.93* 1.68* 1.76*  CALCIUM  9.3 8.8* 9.2  MG  --  2.0 1.9  PHOS  --  3.7 3.4   GFR: CrCl cannot be calculated (Unknown ideal weight.). Liver Function Tests: Recent Labs  Lab 05/22/24 1700 05/22/24 2358 05/23/24 0509  AST 45* 39 38  ALT 47* 45* 45*  ALKPHOS 78 75 70  BILITOT 3.1* 2.4* 2.6*  PROT 7.0 6.2* 6.0*  ALBUMIN  4.0 3.7 3.4*   No results for input(s): "LIPASE", "AMYLASE" in the last 168 hours. No results for input(s): "AMMONIA" in the last 168 hours. Coagulation Profile: Recent Labs  Lab 05/22/24 2358  INR 1.3*   Cardiac Enzymes: Recent Labs  Lab 05/22/24 2358  CKTOTAL 67   BNP (last 3 results) No  results for input(s): "PROBNP" in the last 8760 hours. HbA1C: No results for input(s): "HGBA1C" in the last 72 hours. CBG: No results for input(s): "GLUCAP" in the last 168 hours. Lipid Profile: No results for input(s): "CHOL", "HDL", "LDLCALC", "TRIG", "CHOLHDL", "LDLDIRECT" in the last 72 hours. Thyroid  Function Tests: No results for input(s): "TSH", "T4TOTAL", "FREET4", "T3FREE", "THYROIDAB" in the last 72 hours. Anemia Panel: No results for input(s): "VITAMINB12", "FOLATE", "FERRITIN", "TIBC", "IRON", "RETICCTPCT" in the last 72 hours. Sepsis Labs: Recent Labs  Lab 05/22/24 2358 05/23/24 0316  LATICACIDVEN 1.9 1.8    No results found for this or any previous visit (from the past 240 hours).       Radiology Studies: US  Abdomen Limited RUQ (LIVER/GB) Result Date: 05/22/2024 CLINICAL DATA:  Cholecystitis. EXAM: ULTRASOUND ABDOMEN LIMITED RIGHT UPPER QUADRANT COMPARISON:  CT abdomen and pelvis 05/22/2024 FINDINGS: Gallbladder: There is gallbladder wall thickening measuring 4.8 mm. Pericholecystic is present. No calculi are seen. Sonographic Abigail Abler sign is negative. Common bile duct: Diameter: 4.1 mm Liver: No focal lesion identified. Mildly nodular contour. Within normal limits in parenchymal echogenicity. Portal vein is patent on color Doppler imaging with normal direction of blood flow towards the liver. Other: Trace free  fluid in the right upper quadrant. IMPRESSION: 1. Gallbladder wall thickening and pericholecystic fluid. No calculi are seen. Sonographic Abigail Abler sign is negative. Findings are equivocal for acute cholecystitis. 2. Mildly nodular contour of the liver, which can be seen in the setting of cirrhosis. 3. Trace free fluid in the right upper quadrant. Electronically Signed   By: Tyron Gallon M.D.   On: 05/22/2024 22:20   CT Angio Chest PE W and/or Wo Contrast Result Date: 05/22/2024 CLINICAL DATA:  None EXAM: CT ANGIOGRAPHY CHEST CT ABDOMEN AND PELVIS WITH CONTRAST  TECHNIQUE: Multidetector CT imaging of the chest was performed using the standard protocol during bolus administration of intravenous contrast. Multiplanar CT image reconstructions and MIPs were obtained to evaluate the vascular anatomy. Multidetector CT imaging of the abdomen and pelvis was performed using the standard protocol during bolus administration of intravenous contrast. RADIATION DOSE REDUCTION: This exam was performed according to the departmental dose-optimization program which includes automated exposure control, adjustment of the mA and/or kV according to patient size and/or use of iterative reconstruction technique. CONTRAST:  80mL OMNIPAQUE  IOHEXOL  350 MG/ML SOLN COMPARISON:  None Available. FINDINGS: CTA CHEST FINDINGS Cardiovascular: Mild to moderate global cardiomegaly. There is adequate opacification of the pulmonary arterial tree. No intraluminal filling defect identified to suggest acute pulmonary embolism. The central pulmonary arteries are of normal caliber. Reflux of contrast into the hepatic venous systems in keeping with at least some degree of right heart failure. No pericardial effusion. Mild atherosclerotic calcification within the thoracic aorta. Left single lead transvenous pacemaker is in place with its lead within the right ventricle. Mediastinum/Nodes: No enlarged mediastinal, hilar, or axillary lymph nodes. Thyroid  gland, trachea, and esophagus demonstrate no significant findings. Lungs/Pleura: Lungs are clear. No pleural effusion or pneumothorax. Musculoskeletal: No chest wall abnormality. No acute or significant osseous findings. Review of the MIP images confirms the above findings. CT ABDOMEN and PELVIS FINDINGS Hepatobiliary: There is gallbladder wall thickening and pericholecystic edema, nonspecific in the setting trace perihepatic ascites and evidence of cardiogenic failure. No enhancing intrahepatic mass. No intra or extrahepatic biliary ductal dilation. Pancreas:  Unremarkable Spleen: Unremarkable Adrenals/Urinary Tract: The adrenal glands are unremarkable. Striated bilateral cortical nephrogram has progressed slightly since prior examination suggesting an infectious or inflammatory process as can be seen with pyelonephritis and/or acute kidney injury. The kidneys are normal in size and position. No hydronephrosis. No intrarenal or ureteral calculi. The bladder is unremarkable. Stomach/Bowel: Trace perihepatic ascites. Stomach, small bowel, and large bowel are unremarkable save for mild sigmoid diverticulosis. Appendix normal. No free intraperitoneal gas. Vascular/Lymphatic: No significant vascular findings are present. No enlarged abdominal or pelvic lymph nodes. Reproductive: Prostate is unremarkable. Other: No abdominal wall hernia. Mild diffuse subcutaneous body wall edema. Musculoskeletal: No acute or significant osseous findings. Review of the MIP images confirms the above findings. IMPRESSION: 1. No acute pulmonary embolism. 2. Mild to moderate global cardiomegaly. Reflux of contrast into the hepatic venous systems in keeping with at least some degree of right heart failure. 3. Striated bilateral cortical nephrogram has progressed slightly since prior examination suggesting an infectious or inflammatory process as can be seen with pyelonephritis and/or acute kidney injury. Correlation with urinalysis and renal function tests are recommended. 4. Gallbladder wall thickening and pericholecystic edema, nonspecific in the setting of trace perihepatic ascites and evidence of cardiogenic failure. While this can simply represent a component of congestive hepatopathy, acute inflammatory conditions of the liver, biliary tree or gallbladder itself including acute cholecystitis, could appear in this fashion.  Right upper quadrant sonography and or hepatic biliary scintigraphy may be helpful for further evaluation if there is clinical concern for acute cholecystitis. 5. Mild sigmoid  diverticulosis. 6. Aortic atherosclerosis. Aortic Atherosclerosis (ICD10-I70.0). Electronically Signed   By: Worthy Heads M.D.   On: 05/22/2024 21:23   CT ABDOMEN PELVIS W CONTRAST Result Date: 05/22/2024 CLINICAL DATA:  None EXAM: CT ANGIOGRAPHY CHEST CT ABDOMEN AND PELVIS WITH CONTRAST TECHNIQUE: Multidetector CT imaging of the chest was performed using the standard protocol during bolus administration of intravenous contrast. Multiplanar CT image reconstructions and MIPs were obtained to evaluate the vascular anatomy. Multidetector CT imaging of the abdomen and pelvis was performed using the standard protocol during bolus administration of intravenous contrast. RADIATION DOSE REDUCTION: This exam was performed according to the departmental dose-optimization program which includes automated exposure control, adjustment of the mA and/or kV according to patient size and/or use of iterative reconstruction technique. CONTRAST:  80mL OMNIPAQUE  IOHEXOL  350 MG/ML SOLN COMPARISON:  None Available. FINDINGS: CTA CHEST FINDINGS Cardiovascular: Mild to moderate global cardiomegaly. There is adequate opacification of the pulmonary arterial tree. No intraluminal filling defect identified to suggest acute pulmonary embolism. The central pulmonary arteries are of normal caliber. Reflux of contrast into the hepatic venous systems in keeping with at least some degree of right heart failure. No pericardial effusion. Mild atherosclerotic calcification within the thoracic aorta. Left single lead transvenous pacemaker is in place with its lead within the right ventricle. Mediastinum/Nodes: No enlarged mediastinal, hilar, or axillary lymph nodes. Thyroid  gland, trachea, and esophagus demonstrate no significant findings. Lungs/Pleura: Lungs are clear. No pleural effusion or pneumothorax. Musculoskeletal: No chest wall abnormality. No acute or significant osseous findings. Review of the MIP images confirms the above findings. CT  ABDOMEN and PELVIS FINDINGS Hepatobiliary: There is gallbladder wall thickening and pericholecystic edema, nonspecific in the setting trace perihepatic ascites and evidence of cardiogenic failure. No enhancing intrahepatic mass. No intra or extrahepatic biliary ductal dilation. Pancreas: Unremarkable Spleen: Unremarkable Adrenals/Urinary Tract: The adrenal glands are unremarkable. Striated bilateral cortical nephrogram has progressed slightly since prior examination suggesting an infectious or inflammatory process as can be seen with pyelonephritis and/or acute kidney injury. The kidneys are normal in size and position. No hydronephrosis. No intrarenal or ureteral calculi. The bladder is unremarkable. Stomach/Bowel: Trace perihepatic ascites. Stomach, small bowel, and large bowel are unremarkable save for mild sigmoid diverticulosis. Appendix normal. No free intraperitoneal gas. Vascular/Lymphatic: No significant vascular findings are present. No enlarged abdominal or pelvic lymph nodes. Reproductive: Prostate is unremarkable. Other: No abdominal wall hernia. Mild diffuse subcutaneous body wall edema. Musculoskeletal: No acute or significant osseous findings. Review of the MIP images confirms the above findings. IMPRESSION: 1. No acute pulmonary embolism. 2. Mild to moderate global cardiomegaly. Reflux of contrast into the hepatic venous systems in keeping with at least some degree of right heart failure. 3. Striated bilateral cortical nephrogram has progressed slightly since prior examination suggesting an infectious or inflammatory process as can be seen with pyelonephritis and/or acute kidney injury. Correlation with urinalysis and renal function tests are recommended. 4. Gallbladder wall thickening and pericholecystic edema, nonspecific in the setting of trace perihepatic ascites and evidence of cardiogenic failure. While this can simply represent a component of congestive hepatopathy, acute inflammatory  conditions of the liver, biliary tree or gallbladder itself including acute cholecystitis, could appear in this fashion. Right upper quadrant sonography and or hepatic biliary scintigraphy may be helpful for further evaluation if there is clinical concern for acute cholecystitis.  5. Mild sigmoid diverticulosis. 6. Aortic atherosclerosis. Aortic Atherosclerosis (ICD10-I70.0). Electronically Signed   By: Worthy Heads M.D.   On: 05/22/2024 21:23   DG Chest Portable 1 View Result Date: 05/22/2024 CLINICAL DATA:  Chest pain EXAM: PORTABLE CHEST 1 VIEW COMPARISON:  04/19/2024 FINDINGS: Right ventricular ICD. Chin overlies the right apex and thoracic inlet. Midline trachea. Moderate cardiomegaly. No pleural effusion or pneumothorax. No congestive failure. Clear lungs. IMPRESSION: Cardiomegaly without congestive failure. Electronically Signed   By: Lore Rode M.D.   On: 05/22/2024 17:27        Scheduled Meds:  amiodarone   200 mg Oral Daily   bictegravir-emtricitabine -tenofovir  AF  1 tablet Oral QAC breakfast   finasteride   5 mg Oral q AM   fluticasone  furoate-vilanterol  1 puff Inhalation Daily   mexiletine  250 mg Oral Q12H   midodrine   30 mg Oral TID WC   pantoprazole   40 mg Oral Q0600   tamsulosin   0.4 mg Oral QPC supper   valACYclovir   500 mg Oral q AM   Continuous Infusions:  cefTRIAXone  (ROCEPHIN )  IV     metronidazole  Stopped (05/23/24 1109)   promethazine  (PHENERGAN ) injection (IM or IVPB) 6.25 mg (05/23/24 1250)     LOS: 1 day    Time spent: 55 minutes    Vada Garibaldi, MD [facility]

## 2024-05-23 NOTE — Assessment & Plan Note (Signed)
 Patient states he has trouble urinating Obtain bladder scan Strict I's and O check postvoid residual if ready evidence of urinary retention could consider trial decrease midodrine 

## 2024-05-23 NOTE — ED Notes (Signed)
 PT HR up complaints of chest pain, NP notified, provider at bedside

## 2024-05-23 NOTE — Progress Notes (Signed)
 [facility] liaison note:  Patient has revoked his hospice benefit effective today with verbalization that he does not want to be on hospice and wants to continue aggressive care.   Thank you  Dwane Gitelman, BSN, [facility] liaison [phone]

## 2024-05-23 NOTE — Discharge Instructions (Signed)
 LAPAROSCOPIC SURGERY: POST OP INSTRUCTIONS Always review your discharge instruction sheet given to you by the facility where your surgery was performed. IF YOU HAVE DISABILITY OR FAMILY LEAVE FORMS, YOU MUST BRING THEM TO THE OFFICE FOR PROCESSING.   DO NOT GIVE THEM TO YOUR DOCTOR.  PAIN CONTROL  First take acetaminophen  (Tylenol ) AND/or ibuprofen  (Advil ) to control your pain after surgery.  Follow directions on package.  Taking acetaminophen  (Tylenol ) and/or ibuprofen  (Advil ) regularly after surgery will help to control your pain and lower the amount of prescription pain medication you may need.  You should not take more than 3,000 mg (3 grams) of acetaminophen  (Tylenol ) in 24 hours.  You should not take ibuprofen  (Advil ), aleve, motrin , naprosyn or other NSAIDS if you have a history of stomach ulcers or chronic kidney disease.  A prescription for pain medication may be given to you upon discharge.  Take your pain medication as prescribed, if you still have uncontrolled pain after taking acetaminophen  (Tylenol ) or ibuprofen  (Advil ). Use ice packs to help control pain. If you need a refill on your pain medication, please contact your pharmacy.  They will contact our office to request authorization. Prescriptions will not be filled after 5pm or on week-ends.  HOME MEDICATIONS Take your usually prescribed medications unless otherwise directed.  DIET You should follow a light diet the first few days after arrival home.  Be sure to include lots of fluids daily. Avoid fatty, fried foods.   CONSTIPATION It is common to experience some constipation after surgery and if you are taking pain medication.  Increasing fluid intake and taking a stool softener (such as Colace) will usually help or prevent this problem from occurring.  A mild laxative (Milk of Magnesia or Miralax ) should be taken according to package instructions if there are no bowel movements after 48 hours.  WOUND/INCISION CARE Most  patients will experience some swelling and bruising in the area of the incisions.  Ice packs will help.  Swelling and bruising can take several days to resolve.  Unless discharge instructions indicate otherwise, follow guidelines below  STERI-STRIPS - you may remove your outer bandages 48 hours after surgery, and you may shower at that time.  You have steri-strips (small skin tapes) in place directly over the incision.  These strips should be left on the skin for 7-10 days.   DERMABOND/SKIN GLUE - you may shower in 24 hours.  The glue will flake off over the next 2-3 weeks. Any sutures or staples will be removed at the office during your follow-up visit.  ACTIVITIES You may resume regular (light) daily activities beginning the next day--such as daily self-care, walking, climbing stairs--gradually increasing activities as tolerated.  You may have sexual intercourse when it is comfortable.  Refrain from any heavy lifting or straining until approved by your doctor. You may drive when you are no longer taking prescription pain medication, you can comfortably wear a seatbelt, and you can safely maneuver your car and apply brakes.  FOLLOW-UP You should see your doctor in the office for a follow-up appointment approximately 2-3 weeks after your surgery.  You should have been given your post-op/follow-up appointment when your surgery was scheduled.  If you did not receive a post-op/follow-up appointment, make sure that you call for this appointment within a day or two after you arrive home to insure a convenient appointment time.  OTHER INSTRUCTIONS   WHEN TO CALL YOUR DOCTOR: Fever over 101.0 Inability to urinate Continued bleeding from incision. Increased pain,  redness, or drainage from the incision. Increasing abdominal pain  The clinic staff is available to answer your questions during regular business hours.  Please don't hesitate to call and ask to speak to one of the nurses for clinical  concerns.  If you have a medical emergency, go to the nearest emergency room or call 911.  A surgeon from [facility] is always on call at the hospital. [address], [place], [place]  [address] ? [address] [address], [place], [place]   [address] [phone] ? [phone] ? FAX [phone] Web site: [email or web address]

## 2024-05-23 NOTE — Significant Event (Signed)
       CROSS COVER NOTE  NAME: Ennis Heavner MRN: [id number] DOB : 1961-11-05 ATTENDING PHYSICIAN: Doutova, Anastassia, MD    Date of Service   05/23/2024   HPI/Events of Note   Message received from nurse PT heart rate in unstable ranging from 115-154   Patient with End-stage combined .heart failure and HIV disease patient is currently on hospice who has had  recurrent admissions for systolic heart failure exacerbation, maintained on torsemide  and midodrine . BNP elevated and AKI also evident. No acute edema on chest xray Has been having intermittent chest pain recent admission for the same And discharged recently on morphine  as needed  ECHO 09/2023 EF 20-25% Code status changed upon admission to full code  Interventions   Assessment/Plan:    05/23/2024    6:00 AM 05/23/2024    2:30 AM 05/23/2024    2:06 AM  Vitals with BMI  Systolic 114 105   Diastolic 86 84   Pulse 61 120 578   Afebrile Alert and oriented x4 reports chest pain left upper chest sudden onset 5/10 lasted 5-10 seconds. Similar to chest pain at home but more intense No current shortness of breath A fib on cardiac monitor rate 122 No current chest pain VBG - chronic compensated metabolic acidosis  Latest Reference Range & Units 05/23/24 06:00  pH, Ven 7.25 - 7.43  7.38  pCO2, Ven 44 - 60 mmHg 22 (L)  pO2, Ven 32 - 45 mmHg 49 (H)  Acid-base deficit 0.0 - 2.0 mmol/L 10.5 (H)  Bicarbonate 20.0 - 28.0 mmol/L 13.0 (L)  O2 Saturation % 79.9  Patient temperature  37.0  Collection site  RIGHT ANTECUBITAL  (L): Data is abnormally low (H): Data is abnormally high Continue to monitor Continue amio and mexelitine when due If rate sustains above 130 notify provider      Kip Peon NP [facility] Cross Cover 7pm-7am - check amion for availability Pager [phone]

## 2024-05-24 DIAGNOSIS — Z7189 Other specified counseling: Secondary | ICD-10-CM

## 2024-05-24 DIAGNOSIS — K810 Acute cholecystitis: Secondary | ICD-10-CM | POA: Diagnosis not present

## 2024-05-24 DIAGNOSIS — Z515 Encounter for palliative care: Secondary | ICD-10-CM | POA: Diagnosis not present

## 2024-05-24 DIAGNOSIS — Z66 Do not resuscitate: Secondary | ICD-10-CM | POA: Diagnosis not present

## 2024-05-24 DIAGNOSIS — R0602 Shortness of breath: Secondary | ICD-10-CM

## 2024-05-24 DIAGNOSIS — Z79899 Other long term (current) drug therapy: Secondary | ICD-10-CM

## 2024-05-24 DIAGNOSIS — R52 Pain, unspecified: Secondary | ICD-10-CM

## 2024-05-24 LAB — HIV-1 RNA QUANT-NO REFLEX-BLD
HIV 1 RNA Quant: <20 {copies}/mL
LOG10 HIV-1 RNA: UNDETERMINED {Log_copies}/mL

## 2024-05-24 MED ORDER — TORSEMIDE 20 MG PO TABS
20.0000 mg | ORAL_TABLET | Freq: Every day | ORAL | Status: DC
  Administered 2024-05-24 – 2024-05-26 (×3): 20 mg via ORAL
  Filled 2024-05-24 (×3): qty 1

## 2024-05-24 MED ORDER — HYDROMORPHONE HCL 1 MG/ML IJ SOLN
0.5000 mg | INTRAMUSCULAR | Status: DC | PRN
  Administered 2024-05-24 – 2024-05-26 (×7): 0.5 mg via INTRAVENOUS
  Filled 2024-05-24 (×8): qty 0.5

## 2024-05-24 MED ORDER — OXYCODONE HCL 5 MG/5ML PO SOLN
5.0000 mg | ORAL | Status: DC | PRN
  Administered 2024-05-24 (×2): 5 mg via ORAL
  Filled 2024-05-24 (×2): qty 5

## 2024-05-24 NOTE — Plan of Care (Signed)
   Problem: Education: Goal: Knowledge of General Education information will improve Description: Including pain rating scale, medication(s)/side effects and non-pharmacologic comfort measures Outcome: Progressing   Problem: Coping: Goal: Level of anxiety will decrease Outcome: Progressing   Problem: Safety: Goal: Ability to remain free from injury will improve Outcome: Progressing

## 2024-05-24 NOTE — Evaluation (Signed)
 Physical Therapy Evaluation Patient Details Name: Ryan Long MRN: [id number] DOB: 1961-10-05 Today's Date: 05/24/2024  History of Present Illness  63 yo male admitted with hypokalemia, chest pain, acute on chronic HF. Hx of bipolar d/o, PTSD, HIV, CKD, COPD, depression, anxiety, HF, NSVT, ICD  Clinical Impression  Pt admitted with above diagnosis.  Pt amb with  household distances at baseline, currently reports being much weaker; agreeable to amb in room distance today. Fatigues easily, incr dyspnea noted with SpO2=100% on RA and HR up to 122 after amb ~ 20'. Will follow in acute setting, do not think pt will need post acute f/u.  Pt currently with functional limitations due to the deficits listed below (see PT Problem List). Pt will benefit from acute skilled PT to increase their independence and safety with mobility to allow discharge.           If plan is discharge home, recommend the following:     Can travel by private vehicle        Equipment Recommendations None recommended by PT  Recommendations for Other Services       Functional Status Assessment Patient has had a recent decline in their functional status and demonstrates the ability to make significant improvements in function in a reasonable and predictable amount of time.     Precautions / Restrictions Restrictions Weight Bearing Restrictions Per Provider Order: No      Mobility  Bed Mobility               General bed mobility comments: pt sitting EOB    Transfers Overall transfer level: Needs assistance Equipment used: Rolling walker (2 wheels) Transfers: Sit to/from Stand Sit to Stand: Contact guard assist           General transfer comment: cues for hand placement and to power up with LEs    Ambulation/Gait Ambulation/Gait assistance: Contact guard assist Gait Distance (Feet): 20 Feet (in room distance per pt request) Assistive device: Rolling walker (2 wheels) Gait  Pattern/deviations: Step-through pattern, Decreased stride length       General Gait Details: CGA for safety, fatigued easily with HR 122 after above distance, 3/4 DOE, SpO2=100% on RA  Stairs            Wheelchair Mobility     Tilt Bed    Modified Rankin (Stroke Patients Only)       Balance     Sitting balance-Leahy Scale: Good     Standing balance support: During functional activity, Reliant on assistive device for balance Standing balance-Leahy Scale: Fair                               Pertinent Vitals/Pain Pain Assessment Pain Assessment: No/denies pain Pain Descriptors / Indicators: Aching Pain Intervention(s): Limited activity within patient's tolerance, Monitored during session, Premedicated before session    Home Living Family/patient expects to be discharged to:: Private residence Living Arrangements: Alone Available Help at Discharge: Friend(s) Type of Home: House Home Access: Stairs to enter Entrance Stairs-Rails: Can reach both;Right;Left Entrance Stairs-Number of Steps: 7   Home Layout: Able to live on main level with bedroom/bathroom Home Equipment: Rollator (4 wheels) Additional Comments: lives in boarding house    Prior Function Prior Level of Function : Independent/Modified Independent             Mobility Comments: ambulating short distances sometimes with rollator, sometimes not ADLs Comments: sold scooter, used to  ride it to grocery store     Extremity/Trunk Assessment   Upper Extremity Assessment Upper Extremity Assessment: Defer to OT evaluation    Lower Extremity Assessment Lower Extremity Assessment: Overall WFL for tasks assessed       Communication   Communication Communication: No apparent difficulties    Cognition Arousal: Alert Behavior During Therapy: WFL for tasks assessed/performed   PT - Cognitive impairments: No apparent impairments                         Following commands:  Intact       Cueing Cueing Techniques: Verbal cues     General Comments      Exercises     Assessment/Plan    PT Assessment Patient needs continued PT services  PT Problem List Decreased activity tolerance;Decreased mobility;Cardiopulmonary status limiting activity       PT Treatment Interventions Gait training;Functional mobility training;Therapeutic activities;Therapeutic exercise;Patient/family education;Balance training;DME instruction    PT Goals (Current goals can be found in the Care Plan section)  Acute Rehab PT Goals Patient Stated Goal: feel better PT Goal Formulation: With patient Time For Goal Achievement: 06/07/24 Potential to Achieve Goals: Good    Frequency Min 2X/week     Co-evaluation               AM-PAC PT "6 Clicks" Mobility  Outcome Measure Help needed turning from your back to your side while in a flat bed without using bedrails?: None Help needed moving from lying on your back to sitting on the side of a flat bed without using bedrails?: None   Help needed standing up from a chair using your arms (e.g., wheelchair or bedside chair)?: A Little Help needed to walk in hospital room?: A Little Help needed climbing 3-5 steps with a railing? : A Little 6 Click Score: 17    End of Session Equipment Utilized During Treatment: Gait belt Activity Tolerance: Patient tolerated treatment well Patient left: in bed;with bed alarm set   PT Visit Diagnosis: Difficulty in walking, not elsewhere classified (R26.2)    Time: 1610-9604 PT Time Calculation (min) (ACUTE ONLY): 11 min   Charges:   PT Evaluation $PT Eval Low Complexity: 1 Low   PT General Charges $$ ACUTE PT VISIT: 1 Visit         Caileen Veracruz, PT  Acute Rehab Dept ([facility]) [phone]  05/24/2024   [facility] 05/24/2024, 1:34 PM

## 2024-05-24 NOTE — Progress Notes (Signed)
 PROGRESS NOTE    Ryan Long  ZOX:[id number] DOB: 1961-03-16 DOA: 05/22/2024 PCP: Tretha Fu, MD    Brief Narrative:  63 year old gentleman with history of end-stage heart failure with known ejection fraction 20%, hypertension, hyperlipidemia, depression, bipolar disorder and HIV with recurrent hospitalization and discharged to long-term care facility with hospice on 5/23, was suffering from epigastric and chest pain for last 1 week revoked his hospice status, left the nursing home and asked his previous landlord to bring him to ER.  He has been suffering from right upper quadrant pain and intermittent chest pain. At the emergency room, complaining of chest pain relieved with morphine .  Tachycardic.  On room air.  Right upper quadrant ultrasound consistent with pericholecystic fluid without gallbladder stone.  Seen by surgery.  Possible acute cholecystitis, conservative management advised as patient is not a surgical candidate given his poor cardiovascular status.  Subjective:  Patient seen and examined.  After also having ongoing chest pain, last night he slept well.  He still has some midsternal discomfort.  Right upper quadrant pain only on palpation.  Denies any nausea or vomiting.  Today he has ankle swelling.  He thinks he can eat regular food.   Assessment & Plan:   Suspected acute cholecystitis: Suspected with right upper quadrant pain and presence of pericholecystic fluid on ultrasound.  Currently fairly stable.  Patient with severe clinical morbidities not favorable for surgical treatment.  Discussed with surgery. Adequate pain medications.  Continue broad-spectrum antibiotics.  Will advance diet today. If severe pain, fever or distention will ask IR to place percutaneous drain.  End-stage systolic heart failure: Today with some evidence of ankle edema.  Will gradually resume torsemide .  Chest pain: Continues to have epigastric and midsternal chest pain.  Troponin  nonischemic.  Very high risk of cardiac morbidity.  Not a candidate for ischemic evaluation. Continue Maxijul, midodrine . Adequate oral and IV opiates for pain relief.  Paroxysmal A-fib: Sinus rhythm.  On amiodarone .  Not on anticoagulation.  HIV disease: Well-controlled.  Currently on Biktarvy .  AKI: Close monitoring.  Goal of care: Patient revoked hospice to get symptom relief and treated for infection.  We discussed in detail especially about his cardiac issues, severe congestive heart failure and high mortality.  Patient understands.  Currently wants to be treated for his gallbladder infection and get symptom relief.  Decided to continue antibiotics. CODE STATUS: Discussed in detail 6/6.  Patient was appreciative of discussion and agreed to DNR/DNI but to continue intervention otherwise. Consult palliative care team to coordinate care. Patient does not have next of kin or any healthcare power of attorney.   DVT prophylaxis: SCDs Start: 05/23/24 0206   Code Status: DNR/DNI Family Communication: None at the bedside Disposition Plan: Status is: Inpatient Remains inpatient appropriate because: IV antibiotics, significant pain     Consultants:  General Surgery Palliative care  Procedures:  None  Antimicrobials:  Rocephin  and Flagyl  6/5---     Objective: Vitals:   05/24/24 0925 05/24/24 1013 05/24/24 1042 05/24/24 1201  BP: 95/82 101/83 106/76 (!) 89/72  Pulse: (!) 116 (!) 116 (!) 110 (!) 111  Resp: 20 (!) 24 18 20   Temp: 97.7 F (36.5 C) 97.8 F (36.6 C) 97.8 F (36.6 C) 97.8 F (36.6 C)  TempSrc: Oral Oral Oral Oral  SpO2: 100% 100% 100% 100%    Intake/Output Summary (Last 24 hours) at 05/24/2024 1216 Last data filed at 05/24/2024 0900 Gross per 24 hour  Intake 360 ml  Output --  Net 360 ml   There were no vitals filed for this visit.  Examination:  General exam: Mild discomfort with pain.  Anxious. Respiratory system: Clear to auscultation. Respiratory  effort normal.  On room air. Cardiovascular system: S1 & S2 heard, RRR.  Trace bilateral pedal edema. Gastrointestinal system: Soft.  Mild tenderness right upper quadrant on deep palpation without rigidity or guarding.  Murphy sign negative.  Bowel sounds present. Central nervous system: Alert and oriented. No focal neurological deficits. Extremities: Symmetric 5 x 5 power.    Data Reviewed: I have personally reviewed following labs and imaging studies  CBC: Recent Labs  Lab 05/22/24 1700 05/23/24 0509  WBC 5.2 3.1*  NEUTROABS 3.7  --   HGB 15.4 14.0  HCT 45.1 40.4  MCV 100.0 98.8  PLT 160 122*   Basic Metabolic Panel: Recent Labs  Lab 05/22/24 1700 05/22/24 2358 05/23/24 0509  NA 138 138 141  K 5.8* 4.5 4.2  CL 106 106 112*  CO2 19* 19* 17*  GLUCOSE 102* 79 73  BUN 27* 26* 26*  CREATININE 1.93* 1.68* 1.76*  CALCIUM  9.3 8.8* 9.2  MG  --  2.0 1.9  PHOS  --  3.7 3.4   GFR: CrCl cannot be calculated (Unknown ideal weight.). Liver Function Tests: Recent Labs  Lab 05/22/24 1700 05/22/24 2358 05/23/24 0509  AST 45* 39 38  ALT 47* 45* 45*  ALKPHOS 78 75 70  BILITOT 3.1* 2.4* 2.6*  PROT 7.0 6.2* 6.0*  ALBUMIN  4.0 3.7 3.4*   No results for input(s): "LIPASE", "AMYLASE" in the last 168 hours. No results for input(s): "AMMONIA" in the last 168 hours. Coagulation Profile: Recent Labs  Lab 05/22/24 2358  INR 1.3*   Cardiac Enzymes: Recent Labs  Lab 05/22/24 2358  CKTOTAL 67   BNP (last 3 results) No results for input(s): "PROBNP" in the last 8760 hours. HbA1C: No results for input(s): "HGBA1C" in the last 72 hours. CBG: No results for input(s): "GLUCAP" in the last 168 hours. Lipid Profile: No results for input(s): "CHOL", "HDL", "LDLCALC", "TRIG", "CHOLHDL", "LDLDIRECT" in the last 72 hours. Thyroid  Function Tests: Recent Labs    05/23/24 1518  TSH 1.054   Anemia Panel: No results for input(s): "VITAMINB12", "FOLATE", "FERRITIN", "TIBC", "IRON",  "RETICCTPCT" in the last 72 hours. Sepsis Labs: Recent Labs  Lab 05/22/24 2358 05/23/24 0316  LATICACIDVEN 1.9 1.8    No results found for this or any previous visit (from the past 240 hours).       Radiology Studies: US  Abdomen Limited RUQ (LIVER/GB) Result Date: 05/22/2024 CLINICAL DATA:  Cholecystitis. EXAM: ULTRASOUND ABDOMEN LIMITED RIGHT UPPER QUADRANT COMPARISON:  CT abdomen and pelvis 05/22/2024 FINDINGS: Gallbladder: There is gallbladder wall thickening measuring 4.8 mm. Pericholecystic is present. No calculi are seen. Sonographic Abigail Abler sign is negative. Common bile duct: Diameter: 4.1 mm Liver: No focal lesion identified. Mildly nodular contour. Within normal limits in parenchymal echogenicity. Portal vein is patent on color Doppler imaging with normal direction of blood flow towards the liver. Other: Trace free fluid in the right upper quadrant. IMPRESSION: 1. Gallbladder wall thickening and pericholecystic fluid. No calculi are seen. Sonographic Abigail Abler sign is negative. Findings are equivocal for acute cholecystitis. 2. Mildly nodular contour of the liver, which can be seen in the setting of cirrhosis. 3. Trace free fluid in the right upper quadrant. Electronically Signed   By: Tyron Gallon M.D.   On: 05/22/2024 22:20   CT Angio Chest PE W and/or Wo  Contrast Result Date: 05/22/2024 CLINICAL DATA:  None EXAM: CT ANGIOGRAPHY CHEST CT ABDOMEN AND PELVIS WITH CONTRAST TECHNIQUE: Multidetector CT imaging of the chest was performed using the standard protocol during bolus administration of intravenous contrast. Multiplanar CT image reconstructions and MIPs were obtained to evaluate the vascular anatomy. Multidetector CT imaging of the abdomen and pelvis was performed using the standard protocol during bolus administration of intravenous contrast. RADIATION DOSE REDUCTION: This exam was performed according to the departmental dose-optimization program which includes automated exposure  control, adjustment of the mA and/or kV according to patient size and/or use of iterative reconstruction technique. CONTRAST:  80mL OMNIPAQUE  IOHEXOL  350 MG/ML SOLN COMPARISON:  None Available. FINDINGS: CTA CHEST FINDINGS Cardiovascular: Mild to moderate global cardiomegaly. There is adequate opacification of the pulmonary arterial tree. No intraluminal filling defect identified to suggest acute pulmonary embolism. The central pulmonary arteries are of normal caliber. Reflux of contrast into the hepatic venous systems in keeping with at least some degree of right heart failure. No pericardial effusion. Mild atherosclerotic calcification within the thoracic aorta. Left single lead transvenous pacemaker is in place with its lead within the right ventricle. Mediastinum/Nodes: No enlarged mediastinal, hilar, or axillary lymph nodes. Thyroid  gland, trachea, and esophagus demonstrate no significant findings. Lungs/Pleura: Lungs are clear. No pleural effusion or pneumothorax. Musculoskeletal: No chest wall abnormality. No acute or significant osseous findings. Review of the MIP images confirms the above findings. CT ABDOMEN and PELVIS FINDINGS Hepatobiliary: There is gallbladder wall thickening and pericholecystic edema, nonspecific in the setting trace perihepatic ascites and evidence of cardiogenic failure. No enhancing intrahepatic mass. No intra or extrahepatic biliary ductal dilation. Pancreas: Unremarkable Spleen: Unremarkable Adrenals/Urinary Tract: The adrenal glands are unremarkable. Striated bilateral cortical nephrogram has progressed slightly since prior examination suggesting an infectious or inflammatory process as can be seen with pyelonephritis and/or acute kidney injury. The kidneys are normal in size and position. No hydronephrosis. No intrarenal or ureteral calculi. The bladder is unremarkable. Stomach/Bowel: Trace perihepatic ascites. Stomach, small bowel, and large bowel are unremarkable save for mild  sigmoid diverticulosis. Appendix normal. No free intraperitoneal gas. Vascular/Lymphatic: No significant vascular findings are present. No enlarged abdominal or pelvic lymph nodes. Reproductive: Prostate is unremarkable. Other: No abdominal wall hernia. Mild diffuse subcutaneous body wall edema. Musculoskeletal: No acute or significant osseous findings. Review of the MIP images confirms the above findings. IMPRESSION: 1. No acute pulmonary embolism. 2. Mild to moderate global cardiomegaly. Reflux of contrast into the hepatic venous systems in keeping with at least some degree of right heart failure. 3. Striated bilateral cortical nephrogram has progressed slightly since prior examination suggesting an infectious or inflammatory process as can be seen with pyelonephritis and/or acute kidney injury. Correlation with urinalysis and renal function tests are recommended. 4. Gallbladder wall thickening and pericholecystic edema, nonspecific in the setting of trace perihepatic ascites and evidence of cardiogenic failure. While this can simply represent a component of congestive hepatopathy, acute inflammatory conditions of the liver, biliary tree or gallbladder itself including acute cholecystitis, could appear in this fashion. Right upper quadrant sonography and or hepatic biliary scintigraphy may be helpful for further evaluation if there is clinical concern for acute cholecystitis. 5. Mild sigmoid diverticulosis. 6. Aortic atherosclerosis. Aortic Atherosclerosis (ICD10-I70.0). Electronically Signed   By: Worthy Heads M.D.   On: 05/22/2024 21:23   CT ABDOMEN PELVIS W CONTRAST Result Date: 05/22/2024 CLINICAL DATA:  None EXAM: CT ANGIOGRAPHY CHEST CT ABDOMEN AND PELVIS WITH CONTRAST TECHNIQUE: Multidetector CT imaging of the  chest was performed using the standard protocol during bolus administration of intravenous contrast. Multiplanar CT image reconstructions and MIPs were obtained to evaluate the vascular anatomy.  Multidetector CT imaging of the abdomen and pelvis was performed using the standard protocol during bolus administration of intravenous contrast. RADIATION DOSE REDUCTION: This exam was performed according to the departmental dose-optimization program which includes automated exposure control, adjustment of the mA and/or kV according to patient size and/or use of iterative reconstruction technique. CONTRAST:  80mL OMNIPAQUE  IOHEXOL  350 MG/ML SOLN COMPARISON:  None Available. FINDINGS: CTA CHEST FINDINGS Cardiovascular: Mild to moderate global cardiomegaly. There is adequate opacification of the pulmonary arterial tree. No intraluminal filling defect identified to suggest acute pulmonary embolism. The central pulmonary arteries are of normal caliber. Reflux of contrast into the hepatic venous systems in keeping with at least some degree of right heart failure. No pericardial effusion. Mild atherosclerotic calcification within the thoracic aorta. Left single lead transvenous pacemaker is in place with its lead within the right ventricle. Mediastinum/Nodes: No enlarged mediastinal, hilar, or axillary lymph nodes. Thyroid  gland, trachea, and esophagus demonstrate no significant findings. Lungs/Pleura: Lungs are clear. No pleural effusion or pneumothorax. Musculoskeletal: No chest wall abnormality. No acute or significant osseous findings. Review of the MIP images confirms the above findings. CT ABDOMEN and PELVIS FINDINGS Hepatobiliary: There is gallbladder wall thickening and pericholecystic edema, nonspecific in the setting trace perihepatic ascites and evidence of cardiogenic failure. No enhancing intrahepatic mass. No intra or extrahepatic biliary ductal dilation. Pancreas: Unremarkable Spleen: Unremarkable Adrenals/Urinary Tract: The adrenal glands are unremarkable. Striated bilateral cortical nephrogram has progressed slightly since prior examination suggesting an infectious or inflammatory process as can be seen  with pyelonephritis and/or acute kidney injury. The kidneys are normal in size and position. No hydronephrosis. No intrarenal or ureteral calculi. The bladder is unremarkable. Stomach/Bowel: Trace perihepatic ascites. Stomach, small bowel, and large bowel are unremarkable save for mild sigmoid diverticulosis. Appendix normal. No free intraperitoneal gas. Vascular/Lymphatic: No significant vascular findings are present. No enlarged abdominal or pelvic lymph nodes. Reproductive: Prostate is unremarkable. Other: No abdominal wall hernia. Mild diffuse subcutaneous body wall edema. Musculoskeletal: No acute or significant osseous findings. Review of the MIP images confirms the above findings. IMPRESSION: 1. No acute pulmonary embolism. 2. Mild to moderate global cardiomegaly. Reflux of contrast into the hepatic venous systems in keeping with at least some degree of right heart failure. 3. Striated bilateral cortical nephrogram has progressed slightly since prior examination suggesting an infectious or inflammatory process as can be seen with pyelonephritis and/or acute kidney injury. Correlation with urinalysis and renal function tests are recommended. 4. Gallbladder wall thickening and pericholecystic edema, nonspecific in the setting of trace perihepatic ascites and evidence of cardiogenic failure. While this can simply represent a component of congestive hepatopathy, acute inflammatory conditions of the liver, biliary tree or gallbladder itself including acute cholecystitis, could appear in this fashion. Right upper quadrant sonography and or hepatic biliary scintigraphy may be helpful for further evaluation if there is clinical concern for acute cholecystitis. 5. Mild sigmoid diverticulosis. 6. Aortic atherosclerosis. Aortic Atherosclerosis (ICD10-I70.0). Electronically Signed   By: Worthy Heads M.D.   On: 05/22/2024 21:23   DG Chest Portable 1 View Result Date: 05/22/2024 CLINICAL DATA:  Chest pain EXAM:  PORTABLE CHEST 1 VIEW COMPARISON:  04/19/2024 FINDINGS: Right ventricular ICD. Chin overlies the right apex and thoracic inlet. Midline trachea. Moderate cardiomegaly. No pleural effusion or pneumothorax. No congestive failure. Clear lungs. IMPRESSION: Cardiomegaly without congestive  failure. Electronically Signed   By: Lore Rode M.D.   On: 05/22/2024 17:27        Scheduled Meds:  amiodarone   200 mg Oral Daily   bictegravir-emtricitabine -tenofovir  AF  1 tablet Oral QAC breakfast   feeding supplement  1 Container Oral TID BM   finasteride   5 mg Oral q AM   fluticasone  furoate-vilanterol  1 puff Inhalation Daily   mexiletine  250 mg Oral Q12H   midodrine   30 mg Oral TID WC   pantoprazole   40 mg Oral Q0600   tamsulosin   0.4 mg Oral QPC supper   torsemide   20 mg Oral Daily   valACYclovir   500 mg Oral q AM   Continuous Infusions:  cefTRIAXone  (ROCEPHIN )  IV 2 g (05/23/24 1957)   metronidazole  500 mg (05/24/24 0952)   promethazine  (PHENERGAN ) injection (IM or IVPB) 6.25 mg (05/23/24 1250)     LOS: 2 days    Time spent: 55 minutes    Vada Garibaldi, MD [facility]

## 2024-05-24 NOTE — TOC Progression Note (Addendum)
 Transition of Care [facility]) - Progression Note    Patient Details  Name: Ryan Long MRN: [id number] Date of Birth: [birth year]  [facility] CM/SW Contact  Levie Ream, RN Phone Number: 05/24/2024, 4:01 PM  Clinical Narrative:    [facility] consulted for hospice referral, HH home, and DME needs; pt not available to speak with TOC; also not able to complete TOC assessment; will pass to on-coming TOC for follow up.       Expected Discharge Plan and Services                                               Social Determinants of Health (SDOH) Interventions SDOH Screenings   Food Insecurity: No Food Insecurity (05/23/2024)  Housing: Low Risk  (05/23/2024)  Transportation Needs: No Transportation Needs (05/23/2024)  Utilities: Not At Risk (05/23/2024)  Alcohol  Screen: Low Risk  (07/17/2023)  Depression (PHQ2-9): Low Risk  (08/01/2023)  Financial Resource Strain: Low Risk  (07/17/2023)  Physical Activity: Insufficiently Active (07/17/2023)  Social Connections: Socially Isolated (04/19/2024)  Stress: Stress Concern Present (07/17/2023)  Tobacco Use: High Risk (04/19/2024)  Health Literacy: Adequate Health Literacy (07/18/2023)    Readmission Risk Interventions    03/19/2024    3:54 PM  Readmission Risk Prevention Plan  Transportation Screening Complete  PCP or Specialist Appt within 3-5 Days Complete  HRI or Home Care Consult Complete  Medication Review (RN Care Manager) Complete

## 2024-05-24 NOTE — Consult Note (Signed)
 Consultation Note Date: 05/24/2024   Patient Name: Ryan Long  DOB: 1961-05-30  MRN: [id number]  Age / Sex: 63 y.o., male   PCP: Tretha Fu, MD Referring Physician: Vada Garibaldi, MD  Reason for Consultation: Establishing goals of care     Chief Complaint/History of Present Illness:   Patient is a 63 year old male with past medical history of end-stage heart failure with known ejection fraction of 20%, hypertension, hyperlipidemia, depression, bipolar disorder, HIV, and recurrent hospitalizations who was admitted on 05/22/2024 for management of epigastric and chest pain for the last week.  Patient had recently been hospitalized and discharged to long-term care facility with hospice on 05/09/2024.  Upon admission, patient found to have suspected acute cholecystitis; patient not a candidate for surgical interventions due to multiple comorbidities.  Patient also receiving management for acute on chronic end-stage systolic heart failure.  Palliative medicine team consulted to assist with complex medical decision making. Of note patient is known to PMT due to multiple hospital admissions.  Extensive review of EMR prior to presenting to bedside.  Hospitalist had already discussed care with patient regarding CODE STATUS.  Patient had revoked hospice to get symptom relief and treated for possible infection.  Hospitalist already discussed with patient about his severe medical comorbidities including his cardiac issues and risk of high mortality associated with this.  Patient has opted to be DNR/DNI.  Patient opts to continue with appropriate medical interventions at this time otherwise. At time of EMR review in past 24 hours patient has received as needed morphine  IV 4 mg x 3 doses and as needed morphine  p.o. 10 mg x 1 dose.  Recent CMP reviewed showing BUN 26, creatinine 1.6, and so estimated GFR 43; monitoring to assist with medication management.  Patient also noted to have elevated ALT at  45 and bilirubin at 2.6. Chest x-ray personally reviewed noting cardiomegaly.  Presented to bedside to meet with patient.  Introduced myself as a member of the palliative medicine team and my role in patient's medical journey.  Patient laying in bed with increased work of breathing noted.  Able to discuss with patient what brought him into the hospital.  Patient describes the epigastric pain and wanting to seek further medical management for it.  Patient wanting to continue with appropriate medical therapies at this time in setting of suspected acute cholecystitis though has agreed with DNR/DNI.  Inquired with patient plans for after hospitalization.  Patient noted to have difficult social circumstances.  Inquired if patient would want further hospice support moving forward.  Patient agreed that he would want hospice support though would not want hospice support through [facility].  Informed patient that there are multiple other hospices in the area he can choose from.  Patient noted he would consider [facility] hospice.  Noted would asked TOC to assist with bringing a list of hospices so patient can be provided choice and then can determine further disposition planning.  Patient agreed with this.  Also inquired with patient about his symptom management.  Patient does have pain and shortness of breath.  Patient has been receiving as needed morphine  for management.  Discussed with patient transition from morphine  to oxycodone  and IV Dilaudid  to assist with management in setting of renal dysfunction.  Patient agreeing with this plan noting that oxycodone  has worked previously better for him.  Spent time providing emotional support via active listening.  All questions answered at that time.  Noted palliative medicine team continue to follow with patient's medical journey.  Discussed care with RN and hospitalist to coordinate care.  Primary Diagnoses  Present on Admission:  Acute cholecystitis  Chest pain   End-stage systolic heart failure, chronic (HCC)  Paroxysmal atrial fibrillation (HCC)  HIV disease (HCC)  COPD (chronic obstructive pulmonary disease) (HCC)  Hyperkalemia  Polycythemia  QT prolongation  AKI (acute kidney injury) (HCC)  Acute urinary retention   Past Medical History:  Diagnosis Date   Active smoker    AICD (automatic cardioverter/defibrillator) present    Alcohol  abuse    Allergy July 2023   Anxiety    AR (allergic rhinitis)    Bipolar 1 disorder (HCC)    Cervical lymphadenitis 04/20/2021   CHF (congestive heart failure) (HCC)    Chronic bronchitis (HCC)    Chronic systolic heart failure (HCC)    Controlled substance agreement signed 10/22/2018   COPD (chronic obstructive pulmonary disease) (HCC)    COPD (chronic obstructive pulmonary disease) (HCC) 10/04/2015   Cough 12/31/2018   Crack cocaine use    Depression    Genital herpes    HIV (human immunodeficiency virus infection) (HCC) dx'd 08/1993   HLD (hyperlipidemia)    Hypertension    NICM (nonischemic cardiomyopathy) (HCC)    Echocardiogram 06/28/11: EF 30-35%, mild MR, mild LAE;  No CAD by coronary CT [facility]ogram 3/[facility]   NSVT (nonsustained ventricular tachycardia) (HCC)    PTSD (post-traumatic stress disorder)    Social History   Socioeconomic History   Marital status: Single    Spouse name: Not on file   Number of children: Not on file   Years of education: Not on file   Highest education level: GED or equivalent  Occupational History   Not on file  Tobacco Use   Smoking status: Every Day    Current packs/day: 0.75    Average packs/day: 0.8 packs/day for 42.0 years (31.5 ttl pk-yrs)    Types: Cigarettes   Smokeless tobacco: Never   Tobacco comments:    hoping welbutrin will help  Vaping Use   Vaping status: Never Used  Substance and Sexual Activity   Alcohol  use: Not Currently    Alcohol /week: 6.0 standard drinks of alcohol     Comment: 01/08/2018 "stopped  06/2017"   Drug use: Not Currently    Types: "Crack" cocaine    Comment: 01/08/2018 "stopped 06/2017"   Sexual activity: Yes    Birth control/protection: Condom    Comment: pt. declined condoms  Other Topics Concern   Not on file  Social History Narrative   Not on file   Social Drivers of Health   Financial Resource Strain: Low Risk  (07/17/2023)   Overall Financial Resource Strain (CARDIA)    Difficulty of Paying Living Expenses: Not hard at all  Food Insecurity: No Food Insecurity (05/23/2024)   Hunger Vital Sign    Worried About Running Out of Food in the Last Year: Never true    Ran Out of Food in the Last Year: Never true  Transportation Needs: No Transportation Needs (05/23/2024)   PRAPARE - Administrator, Civil Service (Medical): No    Lack of Transportation (Non-Medical): No  Physical Activity: Insufficiently Active (07/17/2023)   Exercise Vital Sign    Days of Exercise per Week: 3 days    Minutes of Exercise per Session: 20 min  Stress: Stress Concern Present (07/17/2023)   [employer] of Occupational Health - Occupational Stress Questionnaire    Feeling of Stress : To some extent  Social  Connections: Socially Isolated (04/19/2024)   Social Connection and Isolation Panel [NHANES]    Frequency of Communication with Friends and Family: Once a week    Frequency of Social Gatherings with Friends and Family: Never    Attends Religious Services: More than 4 times per year    Active Member of Clubs or Organizations: No    Attends Banker Meetings: Never    Marital Status: Never married   Family History  Problem Relation Age of Onset   Glaucoma Mother    Mental illness Mother    Vision loss Mother    Hypertension Father    CAD Father    Mental illness Father    Alcohol  abuse Father    Drug abuse Father    Heart attack Father 71   Early death Father    Heart disease Father    Alcohol  abuse Brother    Drug abuse Brother    Drug abuse Brother     Scheduled Meds:  amiodarone   200 mg Oral Daily   bictegravir-emtricitabine -tenofovir  AF  1 tablet Oral QAC breakfast   feeding supplement  1 Container Oral TID BM   finasteride   5 mg Oral q AM   fluticasone  furoate-vilanterol  1 puff Inhalation Daily   mexiletine  250 mg Oral Q12H   midodrine   30 mg Oral TID WC   pantoprazole   40 mg Oral Q0600   tamsulosin   0.4 mg Oral QPC supper   valACYclovir   500 mg Oral q AM   Continuous Infusions:  cefTRIAXone  (ROCEPHIN )  IV 2 g (05/23/24 1957)   metronidazole  500 mg (05/23/24 2105)   promethazine  (PHENERGAN ) injection (IM or IVPB) 6.25 mg (05/23/24 1250)   PRN Meds:.acetaminophen  **OR** acetaminophen , albuterol , HYDROcodone -acetaminophen , lip balm, LORazepam , morphine  injection, morphine  CONCENTRATE, ondansetron  (ZOFRAN ) IV, polyethylene glycol, promethazine  (PHENERGAN ) injection (IM or IVPB), traZODone  No Known Allergies CBC:    Component Value Date/Time   WBC 3.1 (L) 05/23/2024 0509   HGB 14.0 05/23/2024 0509   HGB 18.5 (H) 11/02/2023 1011   HCT 40.4 05/23/2024 0509   HCT 54.5 (H) 11/02/2023 1011   PLT 122 (L) 05/23/2024 0509   PLT 130 (L) 11/02/2023 1011   MCV 98.8 05/23/2024 0509   MCV 102 (H) 11/02/2023 1011   NEUTROABS 3.7 05/22/2024 1700   NEUTROABS 1.2 (L) 11/15/2022 1200   LYMPHSABS 1.0 05/22/2024 1700   LYMPHSABS 1.4 11/15/2022 1200   MONOABS 0.4 05/22/2024 1700   EOSABS 0.0 05/22/2024 1700   EOSABS 0.0 11/15/2022 1200   BASOSABS 0.0 05/22/2024 1700   BASOSABS 0.0 11/15/2022 1200   Comprehensive Metabolic Panel:    Component Value Date/Time   NA 141 05/23/2024 0509   NA 142 11/02/2023 1011   K 4.2 05/23/2024 0509   CL 112 (H) 05/23/2024 0509   CO2 17 (L) 05/23/2024 0509   BUN 26 (H) 05/23/2024 0509   BUN 14 11/02/2023 1011   CREATININE 1.76 (H) 05/23/2024 0509   CREATININE 2.15 (H) 04/14/2024 1359   GLUCOSE 73 05/23/2024 0509   CALCIUM  9.2 05/23/2024 0509   AST 38 05/23/2024 0509   ALT 45 (H) 05/23/2024 0509    ALKPHOS 70 05/23/2024 0509   BILITOT 2.6 (H) 05/23/2024 0509   BILITOT 0.6 09/06/2021 0927   PROT 6.0 (L) 05/23/2024 0509   PROT 5.4 (L) 09/06/2021 0927   ALBUMIN  3.4 (L) 05/23/2024 0509   ALBUMIN  3.9 09/06/2021 0927    Physical Exam: Vital Signs: BP 95/74 (BP Location: Right Arm)  Pulse (!) 116   Temp 97.6 F (36.4 C) (Oral)   Resp 18   SpO2 93%  SpO2: SpO2: 93 % O2 Device: O2 Device: Nasal Cannula O2 Flow Rate: O2 Flow Rate (L/min): 2 L/min Intake/output summary:  Intake/Output Summary (Last 24 hours) at 05/24/2024 0919 Last data filed at 05/23/2024 1139 Gross per 24 hour  Intake 559.73 ml  Output --  Net 559.73 ml   LBM:   Baseline Weight:   Most recent weight:    General: NAD, alert, laying in bed, chronically ill appearing Cardiovascular: tachycardia noted Respiratory: slightly increased work of breathing noted, not in respiratory distress Neuro: A&Ox4, following commands easily Psych: appropriately answers all questions          Palliative Performance Scale: 40%              Additional Data Reviewed: Recent Labs    05/22/24 1700 05/22/24 2358 05/23/24 0509  WBC 5.2  --  3.1*  HGB 15.4  --  14.0  PLT 160  --  122*  NA 138 138 141  BUN 27* 26* 26*  CREATININE 1.93* 1.68* 1.76*    Imaging: US  Abdomen Limited RUQ (LIVER/GB) CLINICAL DATA:  Cholecystitis.  EXAM: ULTRASOUND ABDOMEN LIMITED RIGHT UPPER QUADRANT  COMPARISON:  CT abdomen and pelvis 05/22/2024  FINDINGS: Gallbladder:  There is gallbladder wall thickening measuring 4.8 mm. Pericholecystic is present. No calculi are seen. Sonographic Abigail Abler sign is negative.  Common bile duct:  Diameter: 4.1 mm  Liver:  No focal lesion identified. Mildly nodular contour. Within normal limits in parenchymal echogenicity. Portal vein is patent on color Doppler imaging with normal direction of blood flow towards the liver.  Other: Trace free fluid in the right upper quadrant.  IMPRESSION: 1.  Gallbladder wall thickening and pericholecystic fluid. No calculi are seen. Sonographic Abigail Abler sign is negative. Findings are equivocal for acute cholecystitis. 2. Mildly nodular contour of the liver, which can be seen in the setting of cirrhosis. 3. Trace free fluid in the right upper quadrant.  Electronically Signed   By: Tyron Gallon M.D.   On: 05/22/2024 22:20 CT ABDOMEN PELVIS W CONTRAST CLINICAL DATA:  None  EXAM: CT ANGIOGRAPHY CHEST  CT ABDOMEN AND PELVIS WITH CONTRAST  TECHNIQUE: Multidetector CT imaging of the chest was performed using the standard protocol during bolus administration of intravenous contrast. Multiplanar CT image reconstructions and MIPs were obtained to evaluate the vascular anatomy. Multidetector CT imaging of the abdomen and pelvis was performed using the standard protocol during bolus administration of intravenous contrast.  RADIATION DOSE REDUCTION: This exam was performed according to the departmental dose-optimization program which includes automated exposure control, adjustment of the mA and/or kV according to patient size and/or use of iterative reconstruction technique.  CONTRAST:  80mL OMNIPAQUE  IOHEXOL  350 MG/ML SOLN  COMPARISON:  None Available.  FINDINGS: CTA CHEST FINDINGS  Cardiovascular: Mild to moderate global cardiomegaly. There is adequate opacification of the pulmonary arterial tree. No intraluminal filling defect identified to suggest acute pulmonary embolism. The central pulmonary arteries are of normal caliber. Reflux of contrast into the hepatic venous systems in keeping with at least some degree of right heart failure. No pericardial effusion. Mild atherosclerotic calcification within the thoracic aorta. Left single lead transvenous pacemaker is in place with its lead within the right ventricle.  Mediastinum/Nodes: No enlarged mediastinal, hilar, or axillary lymph nodes. Thyroid  gland, trachea, and esophagus  demonstrate no significant findings.  Lungs/Pleura: Lungs are clear. No pleural effusion or pneumothorax.  Musculoskeletal: No chest wall abnormality. No acute or significant osseous findings.  Review of the MIP images confirms the above findings.  CT ABDOMEN and PELVIS FINDINGS  Hepatobiliary: There is gallbladder wall thickening and pericholecystic edema, nonspecific in the setting trace perihepatic ascites and evidence of cardiogenic failure. No enhancing intrahepatic mass. No intra or extrahepatic biliary ductal dilation.  Pancreas: Unremarkable  Spleen: Unremarkable  Adrenals/Urinary Tract: The adrenal glands are unremarkable. Striated bilateral cortical nephrogram has progressed slightly since prior examination suggesting an infectious or inflammatory process as can be seen with pyelonephritis and/or acute kidney injury. The kidneys are normal in size and position. No hydronephrosis. No intrarenal or ureteral calculi. The bladder is unremarkable.  Stomach/Bowel: Trace perihepatic ascites. Stomach, small bowel, and large bowel are unremarkable save for mild sigmoid diverticulosis. Appendix normal. No free intraperitoneal gas.  Vascular/Lymphatic: No significant vascular findings are present. No enlarged abdominal or pelvic lymph nodes.  Reproductive: Prostate is unremarkable.  Other: No abdominal wall hernia. Mild diffuse subcutaneous body wall edema.  Musculoskeletal: No acute or significant osseous findings.  Review of the MIP images confirms the above findings.  IMPRESSION: 1. No acute pulmonary embolism. 2. Mild to moderate global cardiomegaly. Reflux of contrast into the hepatic venous systems in keeping with at least some degree of right heart failure. 3. Striated bilateral cortical nephrogram has progressed slightly since prior examination suggesting an infectious or inflammatory process as can be seen with pyelonephritis and/or acute kidney injury.  Correlation with urinalysis and renal function tests are recommended. 4. Gallbladder wall thickening and pericholecystic edema, nonspecific in the setting of trace perihepatic ascites and evidence of cardiogenic failure. While this can simply represent a component of congestive hepatopathy, acute inflammatory conditions of the liver, biliary tree or gallbladder itself including acute cholecystitis, could appear in this fashion. Right upper quadrant sonography and or hepatic biliary scintigraphy may be helpful for further evaluation if there is clinical concern for acute cholecystitis. 5. Mild sigmoid diverticulosis. 6. Aortic atherosclerosis.  Aortic Atherosclerosis (ICD10-I70.0).  Electronically Signed   By: Worthy Heads M.D.   On: 05/22/2024 21:23 CT Angio Chest PE W and/or Wo Contrast CLINICAL DATA:  None  EXAM: CT ANGIOGRAPHY CHEST  CT ABDOMEN AND PELVIS WITH CONTRAST  TECHNIQUE: Multidetector CT imaging of the chest was performed using the standard protocol during bolus administration of intravenous contrast. Multiplanar CT image reconstructions and MIPs were obtained to evaluate the vascular anatomy. Multidetector CT imaging of the abdomen and pelvis was performed using the standard protocol during bolus administration of intravenous contrast.  RADIATION DOSE REDUCTION: This exam was performed according to the departmental dose-optimization program which includes automated exposure control, adjustment of the mA and/or kV according to patient size and/or use of iterative reconstruction technique.  CONTRAST:  80mL OMNIPAQUE  IOHEXOL  350 MG/ML SOLN  COMPARISON:  None Available.  FINDINGS: CTA CHEST FINDINGS  Cardiovascular: Mild to moderate global cardiomegaly. There is adequate opacification of the pulmonary arterial tree. No intraluminal filling defect identified to suggest acute pulmonary embolism. The central pulmonary arteries are of normal caliber. Reflux  of contrast into the hepatic venous systems in keeping with at least some degree of right heart failure. No pericardial effusion. Mild atherosclerotic calcification within the thoracic aorta. Left single lead transvenous pacemaker is in place with its lead within the right ventricle.  Mediastinum/Nodes: No enlarged mediastinal, hilar, or axillary lymph nodes. Thyroid  gland, trachea, and esophagus demonstrate no significant findings.  Lungs/Pleura: Lungs are clear. No pleural effusion or pneumothorax.  Musculoskeletal: No chest wall abnormality. No acute or significant osseous findings.  Review of the MIP images confirms the above findings.  CT ABDOMEN and PELVIS FINDINGS  Hepatobiliary: There is gallbladder wall thickening and pericholecystic edema, nonspecific in the setting trace perihepatic ascites and evidence of cardiogenic failure. No enhancing intrahepatic mass. No intra or extrahepatic biliary ductal dilation.  Pancreas: Unremarkable  Spleen: Unremarkable  Adrenals/Urinary Tract: The adrenal glands are unremarkable. Striated bilateral cortical nephrogram has progressed slightly since prior examination suggesting an infectious or inflammatory process as can be seen with pyelonephritis and/or acute kidney injury. The kidneys are normal in size and position. No hydronephrosis. No intrarenal or ureteral calculi. The bladder is unremarkable.  Stomach/Bowel: Trace perihepatic ascites. Stomach, small bowel, and large bowel are unremarkable save for mild sigmoid diverticulosis. Appendix normal. No free intraperitoneal gas.  Vascular/Lymphatic: No significant vascular findings are present. No enlarged abdominal or pelvic lymph nodes.  Reproductive: Prostate is unremarkable.  Other: No abdominal wall hernia. Mild diffuse subcutaneous body wall edema.  Musculoskeletal: No acute or significant osseous findings.  Review of the MIP images confirms the above  findings.  IMPRESSION: 1. No acute pulmonary embolism. 2. Mild to moderate global cardiomegaly. Reflux of contrast into the hepatic venous systems in keeping with at least some degree of right heart failure. 3. Striated bilateral cortical nephrogram has progressed slightly since prior examination suggesting an infectious or inflammatory process as can be seen with pyelonephritis and/or acute kidney injury. Correlation with urinalysis and renal function tests are recommended. 4. Gallbladder wall thickening and pericholecystic edema, nonspecific in the setting of trace perihepatic ascites and evidence of cardiogenic failure. While this can simply represent a component of congestive hepatopathy, acute inflammatory conditions of the liver, biliary tree or gallbladder itself including acute cholecystitis, could appear in this fashion. Right upper quadrant sonography and or hepatic biliary scintigraphy may be helpful for further evaluation if there is clinical concern for acute cholecystitis. 5. Mild sigmoid diverticulosis. 6. Aortic atherosclerosis.  Aortic Atherosclerosis (ICD10-I70.0).  Electronically Signed   By: Worthy Heads M.D.   On: 05/22/2024 21:23 DG Chest Portable 1 View CLINICAL DATA:  Chest pain  EXAM: PORTABLE CHEST 1 VIEW  COMPARISON:  04/19/2024  FINDINGS: Right ventricular ICD. Chin overlies the right apex and thoracic inlet. Midline trachea. Moderate cardiomegaly. No pleural effusion or pneumothorax. No congestive failure. Clear lungs.  IMPRESSION: Cardiomegaly without congestive failure.  Electronically Signed   By: Lore Rode M.D.   On: 05/22/2024 17:27    I personally reviewed recent imaging.   Palliative Care Assessment and Plan Summary of Established Goals of Care and Medical Treatment Preferences   Patient is a 63 year old male with past medical history of end-stage heart failure with known ejection fraction of 20%, hypertension,  hyperlipidemia, depression, bipolar disorder, HIV, and recurrent hospitalizations who was admitted on 05/22/2024 for management of epigastric and chest pain for the last week.  Patient had recently been hospitalized and discharged to long-term care facility with hospice on 05/09/2024.  Upon admission, patient found to have suspected acute cholecystitis; patient not a candidate for surgical interventions due to multiple comorbidities.  Patient also receiving management for acute on chronic end-stage systolic heart failure.  Palliative medicine team consulted to assist with complex medical decision making. Of note patient is known to PMT due to multiple hospital admissions.  # Complex medical decision making/goals of care  - Discussed care with patient as detailed above in HPI.  Patient with known difficult social support  circumstances.  Patient wanting to continue appropriate medical interventions at this time for management of suspected acute cholecystitis.  Patient has already agreed to DNR/DNI.  Patient agreeing with wanting hospice support through a different agency once discharged.  TOC to assist with providing choice of hospices and discharge coordination.  -  Code Status: Limited: Do not attempt resuscitation (DNR) -DNR-LIMITED -Do Not Intubate/DNI    # Symptom management Patient is receiving these palliative interventions for symptom management with an intent to improve quality of life.   -Pain, shortness of breath in setting of acute cholecystitis and acute on chronic end-stage HFrEF   - Stop IV morphine  and p.o. morphine  in setting of renal dysfunction.  Stop as needed Norco   - Start p.o. oxycodone  solution 5 mg every 4 hours as needed   - Start IV Dilaudid  0.5 mg every 4 hours as needed for breakthrough pain or shortness of breath after oral oxycodone   # Psycho-social/Spiritual Support:  - Support System: [facility]  # Discharge Planning:  To Be Determined  Thank you for allowing  the palliative care team to participate in the care [facility].  Barnett Libel, DO Palliative Care Provider PMT # [phone]  If patient remains symptomatic despite maximum doses, please call PMT at [phone] between 0700 and 1900. Outside of these hours, please call attending, as PMT does not have night coverage.

## 2024-05-24 NOTE — Progress Notes (Signed)
 OT Cancellation Note  Patient Details Name: Catherine Oak MRN: [id number] DOB: 23-Feb-1961   Cancelled Treatment:    Reason Eval/Treat Not Completed: Fatigue/lethargy limiting ability to participate Patient reporting feeling lethargic and asking for therapy to check back on 6/8. OT to continue to follow and check back as schedule will allow.  Wynette Heckler, MS Acute Rehabilitation Department Office# [phone]  05/24/2024, 2:35 PM

## 2024-05-24 NOTE — Plan of Care (Signed)
  Problem: Education: Goal: Knowledge of General Education information will improve Description: Including pain rating scale, medication(s)/side effects and non-pharmacologic comfort measures Outcome: Progressing   Problem: Clinical Measurements: Goal: Diagnostic test results will improve Outcome: Progressing Goal: Respiratory complications will improve Outcome: Progressing Goal: Cardiovascular complication will be avoided Outcome: Progressing   Problem: Activity: Goal: Risk for activity intolerance will decrease Outcome: Progressing   Problem: Nutrition: Goal: Adequate nutrition will be maintained Outcome: Progressing   Problem: Coping: Goal: Level of anxiety will decrease Outcome: Progressing   Problem: Pain Managment: Goal: General experience of comfort will improve and/or be controlled Outcome: Progressing

## 2024-05-25 DIAGNOSIS — R0602 Shortness of breath: Secondary | ICD-10-CM | POA: Diagnosis not present

## 2024-05-25 DIAGNOSIS — K810 Acute cholecystitis: Secondary | ICD-10-CM | POA: Diagnosis not present

## 2024-05-25 DIAGNOSIS — Z79899 Other long term (current) drug therapy: Secondary | ICD-10-CM | POA: Diagnosis not present

## 2024-05-25 DIAGNOSIS — R52 Pain, unspecified: Secondary | ICD-10-CM | POA: Diagnosis not present

## 2024-05-25 DIAGNOSIS — Z515 Encounter for palliative care: Secondary | ICD-10-CM | POA: Diagnosis not present

## 2024-05-25 MED ORDER — OXYCODONE HCL 5 MG/5ML PO SOLN
7.5000 mg | ORAL | Status: DC | PRN
  Administered 2024-05-25 – 2024-05-29 (×12): 7.5 mg via ORAL
  Filled 2024-05-25 (×13): qty 10

## 2024-05-25 NOTE — Progress Notes (Signed)
 Call from tele tech, pt had 50 beat run of v-tach. Pt ambulatory in room upon entering, asymptomatic.  VS obtained.  Encouraged patient to lay down in bed, placed [name] O2 @ 2L.  Notified Denece Finger, NP.

## 2024-05-25 NOTE — Progress Notes (Signed)
 Daily Progress Note   Patient Name: Ryan Long       Date: 05/25/2024 DOB: 01-06-1961  Age: 63 y.o. MRN#: [id number] Attending Physician: Vada Garibaldi, MD Primary Care Physician: Tretha Fu, MD Admit Date: 05/22/2024 Length of Stay: 3 days  Reason for Consultation/Follow-up: Establishing goals of care  Subjective:   CC: Patient noting still having some pain and shortness of breath.  Following up regarding complex medical decision making and symptom management.  Subjective:  Reviewed EMR prior to presenting to bedside.  At time of EMR reviewed past 24 hours patient has received as needed IV Dilaudid  0.5 mg x 3 doses and as needed oral oxycodone  5 mg x 2 doses.  Patient received IV opioids despite being due for oral medication. Reviewed recent hospitalist documentation.  Planning for HIDA scan on 05/26/2024.  Plan to do bedside to see patient.  Patient laying in bed with slightly increased work of breathing.  Able to follow up regarding patient's symptom burden at this time.  Patient notes that his pain is slightly improved though feels it could be better controlled.  Again reviewed with patient use of oxycodone  before IV Dilaudid .  Patient feels that the 5 mg of oxycodone  does help though not enough so noted would slightly increased to 7.5 mg every 4 hours as needed.  Encourage patient to take oral medications prior to IV medications because oral medications last longer.  Does feel opioids assist with his work of breathing as well. Spent time providing emotional support via active listening.  All questions answered at that time.  Noted palliative medicine team continue to follow with patient's medical journey.  Discussed care with hospitalist and RN to coordinate care.  Review of Systems Pain, shortness of breath  Objective:   Vital Signs:  BP 104/75 (BP Location: Right Arm)   Pulse (!) 112   Temp 97.6 F (36.4 C) (Oral)   Resp 12   SpO2 100%   Physical  Exam: General: NAD, alert, laying in bed, chronically ill appearing Cardiovascular: tachycardia noted Respiratory: slightly increased work of breathing noted, not in respiratory distress Neuro: Awake, interactive Psych: appropriately answers all questions, pleasant   Assessment & Plan:   Assessment: Patient is a 63 year old male with past medical history of end-stage heart failure with known ejection fraction of 20%, hypertension, hyperlipidemia, depression, bipolar disorder, HIV, and recurrent hospitalizations who was admitted on 05/22/2024 for management of epigastric and chest pain for the last week.  Patient had recently been hospitalized and discharged to long-term care facility with hospice on 05/09/2024.  Upon admission, patient found to have suspected acute cholecystitis; patient not a candidate for surgical interventions due to multiple comorbidities.  Patient also receiving management for acute on chronic end-stage systolic heart failure.  Palliative medicine team consulted to assist with complex medical decision making. Of note patient is known to PMT due to multiple hospital admissions.  Recommendations/Plan: # Complex medical decision making/goals of care:       - Discussed care with patient as detailed above in HPI.  Patient with known difficult social support circumstances.  Patient wanting to continue appropriate medical interventions at this time for management of suspected acute cholecystitis.  Patient has already agreed to DNR/DNI.  Patient agreeing with wanting hospice support through a different agency once discharged.  TOC to assist with providing choice of hospices and discharge coordination.                -  Code Status: Limited: Do  not attempt resuscitation (DNR) -DNR-LIMITED -Do Not Intubate/DNI     # Symptom management Patient is receiving these palliative interventions for symptom management with an intent to improve quality of life.                 -Pain, shortness of  breath in setting of acute cholecystitis and acute on chronic end-stage HFrEF                               - Increase p.o. oxycodone  solution 7.5 mg every 4 hours as needed                               - Continue IV Dilaudid  0.5 mg every 4 hours as needed for breakthrough pain or shortness of breath after oral oxycodone    # Psycho-social/Spiritual Support:  - Support System: Arvis Bison   # Discharge Planning:  To Be Determined  Discussed with: Patient, hospitalist, RN  Thank you for allowing the palliative care team to participate in the care [facility]  Barnett Libel, DO Palliative Care Provider PMT # [phone]  If patient remains symptomatic despite maximum doses, please call PMT at [phone] between 0700 and 1900. Outside of these hours, please call attending, as PMT does not have night coverage.

## 2024-05-25 NOTE — Progress Notes (Signed)
 OT Cancellation Note  Patient Details Name: Len Azeez MRN: [id number] DOB: 03/12/1961   Cancelled Treatment:    Reason Eval/Treat Not Completed: Patient declined, no reason specified Patient declined to participate today. Patient reported he did not feel well when moving. Patient endorsed that he is able to complete his own LB dressing and toileting at this time. Patient also continued to endorse he needed OT to check on him still. OT to continue to follow and check back as schedule will allow.  Wynette Heckler, MS Acute Rehabilitation Department Office# [phone]  05/25/2024, 9:25 AM

## 2024-05-25 NOTE — Progress Notes (Signed)
 PROGRESS NOTE    Ryan Long  ZOX:[id number] DOB: 1961-09-25 DOA: 05/22/2024 PCP: Tretha Fu, MD    Brief Narrative:  63 year old gentleman with history of end-stage heart failure with known ejection fraction 20%, hypertension, hyperlipidemia, depression, bipolar disorder and HIV with recurrent hospitalization and discharged to long-term care facility with hospice on 5/23, was suffering from epigastric and chest pain for last 1 week revoked his hospice status, left the nursing home and asked his previous landlord to bring him to ER.  He has been suffering from right upper quadrant pain and intermittent chest pain. At the emergency room, complaining of chest pain relieved with morphine .  Tachycardic.  On room air.  Right upper quadrant ultrasound consistent with pericholecystic fluid without gallbladder stone.  Seen by surgery.  Possible acute cholecystitis, conservative management advised as patient is not a surgical candidate given his poor cardiovascular status.  Subjective:  Patient seen and examined.  Patient tells me that his pain is slightly better overnight after changing medication regimen.  Remains afebrile.  He thinks he can eat some soft food.  Still has right upper quadrant abdominal pain on palpation.  Remains afebrile.   Assessment & Plan:   Suspected acute cholecystitis: Suspected with right upper quadrant pain and presence of pericholecystic fluid on ultrasound.  Currently fairly stable.  Patient with severe clinical morbidities not favorable for surgical treatment.  Discussed with surgery. Adequate pain medications.  Continue broad-spectrum antibiotics.  Will advance diet today. Will check HIDA scan tomorrow morning. If severe pain, fever or distention will ask IR to place percutaneous drain.  End-stage systolic heart failure: Euvolemic except some evidence of ankle edema.  Torsemide  20 mg daily resumed.  Chest pain: Chronic ongoing issues.  Very high risk  of cardiac morbidity.  Not a candidate for ischemic evaluation. Continue Maxijul, midodrine . Adequate oral and IV opiates for pain relief.  Appreciate palliative care input.  Paroxysmal A-fib: Sinus rhythm.  On amiodarone .  Not on anticoagulation.  HIV disease: Well-controlled.  Currently on Biktarvy .  AKI: Close monitoring.  Recheck levels tomorrow morning.  Goal of care: Patient revoked hospice to get symptom relief and treated for infection.  We discussed in detail especially about his cardiac issues, severe congestive heart failure and high mortality.  Patient understands.  Currently wants to be treated for his gallbladder infection and get symptom relief.  Decided to continue antibiotics. CODE STATUS: DNR/DNI.   Appreciate palliative care follow-up.  Symptom management.   Anticipate long-term nursing home placement with hospice support.  He wants another hospice company to follow-up.   DVT prophylaxis: SCDs Start: 05/23/24 0206   Code Status: DNR/DNI Family Communication: None at the bedside Disposition Plan: Status is: Inpatient Remains inpatient appropriate because: IV antibiotics, significant pain     Consultants:  General Surgery Palliative care  Procedures:  None  Antimicrobials:  Rocephin  and Flagyl  6/5---     Objective: Vitals:   05/25/24 0450 05/25/24 0459 05/25/24 0748 05/25/24 1009  BP: 104/75   98/72  Pulse: (!) 112   (!) 112  Resp: 18 12  20   Temp: 97.6 F (36.4 C)   (!) 97.5 F (36.4 C)  TempSrc: Oral   Oral  SpO2: 100%  97% 99%    Intake/Output Summary (Last 24 hours) at 05/25/2024 1054 Last data filed at 05/25/2024 0802 Gross per 24 hour  Intake 836.09 ml  Output --  Net 836.09 ml   There were no vitals filed for this visit.  Examination:  General  exam: Fairly comfortable today. Respiratory system: Clear to auscultation. Respiratory effort normal.  On room air. Cardiovascular system: S1 & S2 heard, RRR.  Trace bilateral pedal  edema. Gastrointestinal system: Soft.  Mild tenderness right upper quadrant on deep palpation without rigidity or guarding.  Murphy sign equivocal.  Bowel sounds present. Central nervous system: Alert and oriented. No focal neurological deficits. Extremities: Symmetric 5 x 5 power.    Data Reviewed: I have personally reviewed following labs and imaging studies  CBC: Recent Labs  Lab 05/22/24 1700 05/23/24 0509  WBC 5.2 3.1*  NEUTROABS 3.7  --   HGB 15.4 14.0  HCT 45.1 40.4  MCV 100.0 98.8  PLT 160 122*   Basic Metabolic Panel: Recent Labs  Lab 05/22/24 1700 05/22/24 2358 05/23/24 0509  NA 138 138 141  K 5.8* 4.5 4.2  CL 106 106 112*  CO2 19* 19* 17*  GLUCOSE 102* 79 73  BUN 27* 26* 26*  CREATININE 1.93* 1.68* 1.76*  CALCIUM  9.3 8.8* 9.2  MG  --  2.0 1.9  PHOS  --  3.7 3.4   GFR: CrCl cannot be calculated (Unknown ideal weight.). Liver Function Tests: Recent Labs  Lab 05/22/24 1700 05/22/24 2358 05/23/24 0509  AST 45* 39 38  ALT 47* 45* 45*  ALKPHOS 78 75 70  BILITOT 3.1* 2.4* 2.6*  PROT 7.0 6.2* 6.0*  ALBUMIN  4.0 3.7 3.4*   No results for input(s): "LIPASE", "AMYLASE" in the last 168 hours. No results for input(s): "AMMONIA" in the last 168 hours. Coagulation Profile: Recent Labs  Lab 05/22/24 2358  INR 1.3*   Cardiac Enzymes: Recent Labs  Lab 05/22/24 2358  CKTOTAL 67   BNP (last 3 results) No results for input(s): "PROBNP" in the last 8760 hours. HbA1C: No results for input(s): "HGBA1C" in the last 72 hours. CBG: No results for input(s): "GLUCAP" in the last 168 hours. Lipid Profile: No results for input(s): "CHOL", "HDL", "LDLCALC", "TRIG", "CHOLHDL", "LDLDIRECT" in the last 72 hours. Thyroid  Function Tests: Recent Labs    05/23/24 1518  TSH 1.054   Anemia Panel: No results for input(s): "VITAMINB12", "FOLATE", "FERRITIN", "TIBC", "IRON", "RETICCTPCT" in the last 72 hours. Sepsis Labs: Recent Labs  Lab 05/22/24 2358  05/23/24 0316  LATICACIDVEN 1.9 1.8    No results found for this or any previous visit (from the past 240 hours).       Radiology Studies: No results found.       Scheduled Meds:  amiodarone   200 mg Oral Daily   bictegravir-emtricitabine -tenofovir  AF  1 tablet Oral QAC breakfast   feeding supplement  1 Container Oral TID BM   finasteride   5 mg Oral q AM   fluticasone  furoate-vilanterol  1 puff Inhalation Daily   mexiletine  250 mg Oral Q12H   midodrine   30 mg Oral TID WC   pantoprazole   40 mg Oral Q0600   tamsulosin   0.4 mg Oral QPC supper   torsemide   20 mg Oral Daily   valACYclovir   500 mg Oral q AM   Continuous Infusions:  cefTRIAXone  (ROCEPHIN )  IV 2 g (05/24/24 2054)   metronidazole  500 mg (05/25/24 1053)   promethazine  (PHENERGAN ) injection (IM or IVPB) 6.25 mg (05/24/24 2239)     LOS: 3 days    Time spent: 55 minutes    Vada Garibaldi, MD [facility]

## 2024-05-26 ENCOUNTER — Inpatient Hospital Stay (HOSPITAL_COMMUNITY)

## 2024-05-26 DIAGNOSIS — Z66 Do not resuscitate: Secondary | ICD-10-CM | POA: Diagnosis not present

## 2024-05-26 DIAGNOSIS — I5022 Chronic systolic (congestive) heart failure: Secondary | ICD-10-CM | POA: Diagnosis not present

## 2024-05-26 DIAGNOSIS — K810 Acute cholecystitis: Secondary | ICD-10-CM | POA: Diagnosis not present

## 2024-05-26 DIAGNOSIS — R52 Pain, unspecified: Secondary | ICD-10-CM | POA: Diagnosis not present

## 2024-05-26 DIAGNOSIS — Z515 Encounter for palliative care: Secondary | ICD-10-CM | POA: Diagnosis not present

## 2024-05-26 LAB — COMPREHENSIVE METABOLIC PANEL WITH GFR
ALT: 611 U/L — ABNORMAL HIGH (ref 0–44)
AST: 591 U/L — ABNORMAL HIGH (ref 15–41)
Albumin: 3.6 g/dL (ref 3.5–5.0)
Alkaline Phosphatase: 83 U/L (ref 38–126)
Anion gap: 16 — ABNORMAL HIGH (ref 5–15)
BUN: 57 mg/dL — ABNORMAL HIGH (ref 8–23)
CO2: 18 mmol/L — ABNORMAL LOW (ref 22–32)
Calcium: 8.6 mg/dL — ABNORMAL LOW (ref 8.9–10.3)
Chloride: 102 mmol/L (ref 98–111)
Creatinine, Ser: 3.11 mg/dL — ABNORMAL HIGH (ref 0.61–1.24)
GFR, Estimated: 22 mL/min — ABNORMAL LOW (ref >60)
Glucose, Bld: 110 mg/dL — ABNORMAL HIGH (ref 70–99)
Potassium: 4.4 mmol/L (ref 3.5–5.1)
Sodium: 136 mmol/L (ref 135–145)
Total Bilirubin: 2.6 mg/dL — ABNORMAL HIGH (ref 0.0–1.2)
Total Protein: 6.1 g/dL — ABNORMAL LOW (ref 6.5–8.1)

## 2024-05-26 LAB — MAGNESIUM: Magnesium: 2.2 mg/dL (ref 1.7–2.4)

## 2024-05-26 LAB — CBC WITH DIFFERENTIAL/PLATELET
Abs Immature Granulocytes: 0.02 10*3/uL (ref 0.00–0.07)
Basophils Absolute: 0 10*3/uL (ref 0.0–0.1)
Basophils Relative: 1 %
Eosinophils Absolute: 0 10*3/uL (ref 0.0–0.5)
Eosinophils Relative: 0 %
HCT: 37.2 % — ABNORMAL LOW (ref 39.0–52.0)
Hemoglobin: 13.1 g/dL (ref 13.0–17.0)
Immature Granulocytes: 0 %
Lymphocytes Relative: 18 %
Lymphs Abs: 0.8 10*3/uL (ref 0.7–4.0)
MCH: 34.2 pg — ABNORMAL HIGH (ref 26.0–34.0)
MCHC: 35.2 g/dL (ref 30.0–36.0)
MCV: 97.1 fL (ref 80.0–100.0)
Monocytes Absolute: 0.6 10*3/uL (ref 0.1–1.0)
Monocytes Relative: 14 %
Neutro Abs: 3 10*3/uL (ref 1.7–7.7)
Neutrophils Relative %: 67 %
Platelets: 111 10*3/uL — ABNORMAL LOW (ref 150–400)
RBC: 3.83 MIL/uL — ABNORMAL LOW (ref 4.22–5.81)
RDW: 16.1 % — ABNORMAL HIGH (ref 11.5–15.5)
WBC: 4.5 10*3/uL (ref 4.0–10.5)
nRBC: 2.7 % — ABNORMAL HIGH (ref 0.0–0.2)

## 2024-05-26 LAB — PHOSPHORUS: Phosphorus: 6.5 mg/dL — ABNORMAL HIGH (ref 2.5–4.6)

## 2024-05-26 MED ORDER — AMIODARONE HCL 200 MG PO TABS
100.0000 mg | ORAL_TABLET | Freq: Every day | ORAL | Status: DC
  Administered 2024-05-27 – 2024-05-28 (×2): 100 mg via ORAL
  Filled 2024-05-26 (×2): qty 1

## 2024-05-26 MED ORDER — TECHNETIUM TC 99M MEBROFENIN IV KIT
7.2200 | PACK | Freq: Once | INTRAVENOUS | Status: AC
Start: 2024-05-26 — End: 2024-05-26
  Administered 2024-05-26: 7.22 via INTRAVENOUS

## 2024-05-26 MED ORDER — ENSURE PLUS HIGH PROTEIN PO LIQD
237.0000 mL | Freq: Three times a day (TID) | ORAL | Status: DC
  Administered 2024-05-27 – 2024-05-29 (×6): 237 mL via ORAL

## 2024-05-26 NOTE — Evaluation (Signed)
 Occupational Therapy Evaluation Patient Details Name: Ryan Long MRN: [id number] DOB: 1961/08/15 Today's Date: 05/26/2024   History of Present Illness   Patient is a 63 year old male who presented on 6/5 with right upper quadrant pain and intermittent chest pain. Patient was admitted with suspected acute cholecysitits,PMH: A fib, HIV, AKI,     Clinical Impressions Patient is a 63 year old male who was admitted for above. Patient reported living at home alone using rollator to get to store when needing groceries. Patient's session was limited with patient's tearfulness about current medical status and upcoming procedure. Patient was noted to have decreased functional activity tolerance, decreased endurance, decreased standing balance, decreased safety awareness, and decreased knowledge of AD/AE impacting participation in ADLs.      If plan is discharge home, recommend the following:   Assist for transportation;Assistance with cooking/housework;Direct supervision/assist for medications management;Direct supervision/assist for financial management     Functional Status Assessment   Patient has had a recent decline in their functional status and demonstrates the ability to make significant improvements in function in a reasonable and predictable amount of time.     Equipment Recommendations   None recommended by OT      Precautions/Restrictions   Precautions Precautions: None Precaution/Restrictions Comments: BP runs low Restrictions Weight Bearing Restrictions Per Provider Order: No            ADL either performed or assessed with clinical judgement   ADL Overall ADL's : Needs assistance/impaired Eating/Feeding: Modified independent;Bed level   Grooming: Bed level;Modified independent   Upper Body Bathing: Bed level;Set up   Lower Body Bathing: Bed level;Minimal assistance   Upper Body Dressing : Bed level;Set up   Lower Body Dressing: Bed  level;Minimal assistance     Toilet Transfer Details (indicate cue type and reason): patient indicated that he takes himself to the bathroom and back patient declined to demonstrate during session.           General ADL Comments: patient was tearful during session with reports that he needed to speak with someone. chaplain services was messaged and they are going to speak more with patient per request. patient indicated he lives at home alone and that he wanted help but did not know where to get it. patient's thoughts and concerns were heard and validated. patient was educated on showering using shower seat and keeping door/fan on to maintain breathing. patient verbalized understanding.     Vision Patient Visual Report: No change from baseline              Pertinent Vitals/Pain Pain Assessment Pain Assessment: No/denies pain     Extremity/Trunk Assessment Upper Extremity Assessment Upper Extremity Assessment: Overall WFL for tasks assessed   Lower Extremity Assessment Lower Extremity Assessment: Defer to PT evaluation   Cervical / Trunk Assessment Cervical / Trunk Assessment: Normal   Communication     Cognition Arousal: Alert Behavior During Therapy: WFL for tasks assessed/performed Cognition: No apparent impairments             OT - Cognition Comments: patient was tearful at times during session.                 Following commands: Intact                  Home Living Family/patient expects to be discharged to:: Private residence Living Arrangements: Alone Available Help at Discharge: Friend(s) Type of Home: House Home Access: Stairs to enter [employer] of  Steps: 7 Entrance Stairs-Rails: Can reach both;Right;Left Home Layout: Able to live on main level with bedroom/bathroom     Bathroom Shower/Tub: Chief Strategy Officer: Standard     Home Equipment: Rollator (4 wheels)   Additional Comments: lives in boarding  house      Prior Functioning/Environment Prior Level of Function : Independent/Modified Independent             Mobility Comments: ambulating short distances sometimes with rollator, sometimes not ADLs Comments: sold scooter, used to ride it to grocery store    OT Problem List: Decreased strength;Decreased activity tolerance;Decreased knowledge of precautions;Cardiopulmonary status limiting activity;Pain   OT Treatment/Interventions: Self-care/ADL training;Therapeutic exercise;Energy conservation;DME and/or AE instruction;Therapeutic activities;Patient/family education      OT Goals(Current goals can be found in the care plan section)   Acute Rehab OT Goals Patient Stated Goal: to get better OT Goal Formulation: With patient Time For Goal Achievement: 06/09/24 Potential to Achieve Goals: Fair   OT Frequency:  Min 2X/week       AM-PAC OT "6 Clicks" Daily Activity     Outcome Measure Help from another person eating meals?: None Help from another person taking care of personal grooming?: None Help from another person toileting, which includes using toliet, bedpan, or urinal?: A Little Help from another person bathing (including washing, rinsing, drying)?: A Little Help from another person to put on and taking off regular upper body clothing?: None Help from another person to put on and taking off regular lower body clothing?: A Little 6 Click Score: 21   End of Session Equipment Utilized During Treatment: Oxygen   Activity Tolerance: Patient limited by fatigue Patient left: in bed;with call bell/phone within reach  OT Visit Diagnosis: Muscle weakness (generalized) (M62.81)                Time: 4098-1191 OT Time Calculation (min): 20 min Charges:  OT General Charges $OT Visit: 1 Visit OT Evaluation $OT Eval Low Complexity: 1 Low  Allie Ousley OTR/L, MS Acute Rehabilitation Department Office# [phone]   Jame Maze 05/26/2024, 1:11 PM

## 2024-05-26 NOTE — Progress Notes (Signed)
 Per referral through palliative care team. I visited with [name].  [name] debriefed with me experiences at SNF/hospice and current symptoms.  I provided compassionate presence and active listening. I provided relational support and encouraged his rest and to simply be present. Will continue to follow.  Sydell Prowell L. Minetta Aly, M.Div [phone]

## 2024-05-26 NOTE — Plan of Care (Signed)
  Problem: Clinical Measurements: Goal: Diagnostic test results will improve Outcome: Progressing   Problem: Coping: Goal: Level of anxiety will decrease Outcome: Progressing   

## 2024-05-26 NOTE — TOC Initial Note (Signed)
 Transition of Care [facility]) - Initial/Assessment Note    Patient Details  Name: Ryan Long MRN: [id number] Date of Birth: 1961/04/07  [facility] CM/SW Contact:    Amaryllis Junior, LCSW Phone Number: 05/26/2024, 3:18 PM  Clinical Narrative:                 Pt from [facility]. Pt was previously discharged to a LTC that took 18days to find accepting facility. Facility states that pt left wishing to return to hospital. Facility agrees that pt can return there but reports that pt told facility that he wishes to return home following his hospital stay. LOG was created to get pt in to LTC last month. If pt chooses not to return facility he came from he will have to go home. Pt has no other offers for LTC. The facility is contracted w/ [facility] and pt will have to stick with contracted hospice agency if he chooses.     Barriers to Discharge: No Barriers Identified   Patient Goals and CMS Choice Patient states their goals for this hospitalization and ongoing recovery are:: return to LTC   Choice offered to / list presented to : NA      Expected Discharge Plan and Services In-house Referral: NA     Living arrangements for the past 2 months: Post-Acute Facility (LTC)                 DME Arranged: N/A DME Agency: NA       HH Arranged: NA HH Agency: NA        Prior Living Arrangements/Services Living arrangements for the past 2 months: Psychiatrist (LTC) Lives with:: Facility Resident Patient language and need for interpreter reviewed:: Yes Do you feel safe going back to the place where you live?: Yes      Need for Family Participation in Patient Care: No (Comment) Care giver support system in place?: Yes (comment) Current home services: Hospice Criminal Activity/Legal Involvement Pertinent to Current Situation/Hospitalization: No - Comment as needed  Activities of Daily Living   ADL Screening (condition at time of admission) Independently performs  ADLs?: Yes (appropriate for developmental age) Does the patient have a NEW difficulty with bathing/dressing/toileting/self-feeding that is expected to last >3 days?: Yes (Initiates electronic notice to provider for possible OT consult) Does the patient have a NEW difficulty with getting in/out of bed, walking, or climbing stairs that is expected to last >3 days?: Yes (Initiates electronic notice to provider for possible PT consult) Does the patient have a NEW difficulty with communication that is expected to last >3 days?: No Is the patient deaf or have difficulty hearing?: No Does the patient have difficulty seeing, even when wearing glasses/contacts?: No Does the patient have difficulty concentrating, remembering, or making decisions?: No  Permission Sought/Granted                  Emotional Assessment Appearance:: Appears stated age Attitude/Demeanor/Rapport: Engaged Affect (typically observed): Accepting Orientation: : Oriented to Situation, Oriented to  Time, Oriented to Place, Oriented to Self Alcohol  / Substance Use: Not Applicable Psych Involvement: No (comment)  Admission diagnosis:  Cholecystitis [K81.9] Patient Active Problem List   Diagnosis Date Noted   DNR (do not resuscitate) 05/24/2024   Shortness of breath 05/24/2024   Pain 05/24/2024   Counseling and coordination of care 05/24/2024   Palliative care encounter 05/24/2024   High risk medication use 05/24/2024   Medication management 05/24/2024   Acute urinary  retention 05/23/2024   Acute cholecystitis 05/22/2024   Hyperkalemia 05/22/2024   AKI (acute kidney injury) (HCC) 05/22/2024   Acute CHF (HCC) 04/23/2024   Malnutrition of moderate degree 04/22/2024   Hypokalemia 04/19/2024   Hypotension 04/19/2024   Debility 04/19/2024   Musculoskeletal pain 03/22/2024   Acute on chronic heart failure (HCC) 03/18/2024   Chronic kidney disease, stage 3a (HCC) 02/22/2024   Acute on chronic HFrEF (heart failure with  reduced ejection fraction) (HCC) 02/17/2024   Chest pain 02/17/2024   Ventricular tachycardia (HCC) 01/23/2024   Acute on chronic systolic CHF (congestive heart failure) (HCC) 01/03/2024   Asthmatic bronchitis 01/24/2023   Rash and nonspecific skin eruption 12/19/2022   Arm numbness 11/15/2022   Aortic atherosclerosis (HCC) 11/15/2022   Chronic kidney disease 08/08/2022   Goals of care, counseling/discussion 08/08/2022   Cigarette smoker 01/26/2022   ETD (Eustachian tube dysfunction), right 01/26/2022   Allergy to pollen 01/26/2022   Polycythemia 12/08/2021   Vitamin D deficiency 12/08/2021   Hip pain 01/06/2021   VT (ventricular tachycardia) (HCC) 08/25/2020   HIV positive (HCC) 07/12/2020   Paroxysmal atrial fibrillation (HCC) 06/10/2020   BPH (benign prostatic hyperplasia) 01/28/2020   HFrEF (heart failure with reduced ejection fraction) (HCC) 01/28/2020   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   Achilles tendinitis, left leg 04/03/2019   AICD (automatic cardioverter/defibrillator) present 08/15/2018   QT prolongation 06/28/2018   Adjustment disorder with mixed disturbance of emotions and conduct    NICM (nonischemic cardiomyopathy) (HCC) 01/08/2018   Anxiety 12/20/2017   Bipolar 1 disorder (HCC) 10/10/2017   End-stage systolic heart failure, chronic (HCC) 05/13/2017   HSV-2 infection 07/17/2016   History of attempted suicide 04/14/2016   History of alcoholism (HCC) 04/14/2016   History of substance abuse (HCC) 04/14/2016   Constipation 01/17/2016   COPD (chronic obstructive pulmonary disease) (HCC) 10/04/2015   Abnormal thyroid  stimulating hormone (TSH) level 10/04/2015   MDD (major depressive disorder), recurrent episode, severe (HCC) 09/23/2015   Hypertension 09/10/2015   PTSD (post-traumatic stress disorder) 08/07/2015   Primary insomnia 04/02/2015   Herpesviral infection of penis 04/02/2015   Depression with suicidal ideation 07/03/2014   Generalized anxiety disorder  05/29/2014   Tobacco use disorder 02/17/2013   Heart failure (HCC) 09/19/2011   Other primary cardiomyopathies 08/24/2011   HIV disease (HCC) 02/01/2009   Recurrent HSV (herpes simplex virus) 02/01/2009   Hyperlipidemia 02/01/2009   Allergic rhinitis 02/01/2009   PCP:  Tretha Fu, MD Pharmacy:   [facility] [address] [place], [place] - [address] [place] [address] Phone: [phone] Fax: [phone]  [id number] - [place], [place] - [address] AT [facility] [address] [place] [address] Phone: [phone] Fax: [phone]     Social Drivers of Health (SDOH) Social History: SDOH Screenings   Food Insecurity: No Food Insecurity (05/23/2024)  Housing: Low Risk  (05/23/2024)  Transportation Needs: No Transportation Needs (05/23/2024)  Utilities: Not At Risk (05/23/2024)  Alcohol  Screen: Low Risk  (07/17/2023)  Depression (PHQ2-9): Low Risk  (08/01/2023)  Financial Resource Strain: Low Risk  (07/17/2023)  Physical Activity: Insufficiently Active (07/17/2023)  Social Connections: Socially Isolated (04/19/2024)  Stress: Stress Concern Present (07/17/2023)  Tobacco Use: High Risk (04/19/2024)  Health Literacy: Adequate Health Literacy (07/18/2023)   SDOH Interventions:     Readmission Risk Interventions    05/26/2024    1:15 PM 03/19/2024    3:54 PM  Readmission Risk Prevention Plan  Transportation Screening  Complete Complete  PCP or Specialist Appt within 3-5 Days  Complete  HRI or Home Care Consult  Complete  Medication Review (RN Care Manager) Complete Complete  PCP or Specialist appointment within 3-5 days of discharge Complete   SW Recovery Care/Counseling Consult Complete   Palliative Care Screening Not Applicable   Skilled Nursing Facility Complete

## 2024-05-26 NOTE — Progress Notes (Signed)
 Initial Nutrition Assessment  DOCUMENTATION CODES:   Non-severe (moderate) malnutrition in context of chronic illness  INTERVENTION:   -Ensure Plus High Protein po TID, each supplement provides 350 kcal and 20 grams of protein.   NUTRITION DIAGNOSIS:   Moderate Malnutrition related to chronic illness as evidenced by mild fat depletion, mild muscle depletion.  GOAL:   Patient will meet greater than or equal to 90% of their needs  MONITOR:   PO intake, Supplement acceptance  REASON FOR ASSESSMENT:   Consult Assessment of nutrition requirement/status  ASSESSMENT:   63 year old gentleman with history of end-stage heart failure with known ejection fraction 20%, hypertension, hyperlipidemia, depression, bipolar disorder and HIV with recurrent hospitalization and discharged to long-term care facility with hospice on 5/23, was suffering from epigastric and chest pain for last 1 week revoked his hospice status, left the nursing home and asked his previous landlord to bring him to ER.  He has been suffering from right upper quadrant pain and intermittent chest pain. Admitted for possible acute cholecystitis, conservative management advised as patient is not a surgical candidate given his poor cardiovascular status.  Patient in room, awaiting HIDA scan today. Currently NPO but has been on a soft diet. Pt asking what soft diet is and RD explained. Reports not knowing what to eat. Doesn't like [employer], says he likes Ensure more, willing to drink 3 daily.  Pt has been on hospice. Not feeling well so not eating adequately.  Visit cut short as was being taken down for HIDA.  No weight for this admission, needs new weight.   Medications reviewed.   Labs reviewed: Elevated Phos  UDS+ benzos, opiates  NUTRITION - FOCUSED PHYSICAL EXAM:  Flowsheet Row Most Recent Value  Orbital Region Mild depletion  Upper Arm Region Mild depletion  Thoracic and Lumbar Region Mild depletion  Buccal  Region Mild depletion  Temple Region Mild depletion  Clavicle Bone Region Mild depletion  Clavicle and Acromion Bone Region Mild depletion  Scapular Bone Region Unable to assess  Dorsal Hand Mild depletion  Patellar Region Mild depletion  Anterior Thigh Region Mild depletion  Posterior Calf Region Mild depletion  Edema (RD Assessment) None  Hair Reviewed  Eyes Reviewed  Mouth Reviewed  Skin Reviewed  Nails Reviewed       Diet Order:   Diet Order             DIET SOFT Room service appropriate? Yes; Fluid consistency: Thin  Diet effective now                   EDUCATION NEEDS:   Not appropriate for education at this time  Skin:  Skin Assessment: Reviewed RN Assessment  Last BM:  PTA  Height:   Ht Readings from Last 1 Encounters:  04/19/24 5\' 8"  (1.727 m)    Weight:   Wt Readings from Last 1 Encounters:  04/19/24 69.2 kg    BMI:  There is no height or weight on file to calculate BMI.  Estimated Nutritional Needs:   Kcal:  2000-2200  Protein:  100-110g  Fluid:  2L/day   Arna Better, MS, RD, LDN Inpatient Clinical Dietitian Contact via Secure chat

## 2024-05-26 NOTE — Progress Notes (Signed)
 Daily Progress Note   Patient Name: Ryan Long       Date: 05/26/2024 DOB: [birth year]  Age: 63 y.o. MRN#: [id number] Attending Physician: Vada Garibaldi, MD Primary Care Physician: Tretha Fu, MD Admit Date: 05/22/2024 Length of Stay: 4 days  Reason for Consultation/Follow-up: Establishing goals of care  Subjective:   CC: Patient noting that his pain has improved.  Following up regarding complex medical decision making and symptom management.  Subjective:  Reviewed recent EMR documentation from hospitalist and TOC.  At time of EMR review in past 24 hours patient has received as needed IV Dilaudid  0.5 mg x 5 doses and as needed p.o. oxycodone  7.5 mg x 1 doses.  Patient has received IV Dilaudid  before oral oxycodone  despite order oxycodone  should be received initially for pain with IV Dilaudid  as breakthrough. Patient scheduled to receive HIDA scan today.  Hospitalist documenting that involvement of IR or surgery may need to be considered.  Patient's bilirubin elevated.  Patient's creatinine also elevated to 3.11 with GFR estimated to be 22.  Presented to bedside to see patient.  Patient lying comfortably in bed.  Patient notes that his pain has overall improved.  Patient does feel the as needed oxycodone  helps with his pain.  Patient is currently awaiting to be taken for his HIDA scan.  Medical recommendations after this to be further determined.  Spent time answering questions as able.  Noted palliative medicine team continue to follow with patient's medical journey.  Discussed care with patient's hospitalist and TOC.  Patient had initially stated that he did not want [facility] hospice upon return to the hospital this time.  Patient only has 1 long-term care placement bed and would have to return there if he decides to do so.  That facility contracts with [facility] so patient would have to continue hospice through [facility].  TOC to assist with  discharge planning.  Review of Systems Pain, shortness of breath improving Objective:   Vital Signs:  BP 101/68 (BP Location: Right Arm)   Pulse (!) 120   Temp 98.1 F (36.7 C) (Oral)   Resp 19   SpO2 97%   Physical Exam: General: NAD, alert, laying in bed, chronically ill appearing Cardiovascular: tachycardia noted Respiratory: No increased work of breathing noted, not in respiratory distress Neuro: Awake, interactive Psych: appropriately answers all questions, pleasant   Assessment & Plan:   Assessment: Patient is a 63 year old male with past medical history of end-stage heart failure with known ejection fraction of 20%, hypertension, hyperlipidemia, depression, bipolar disorder, HIV, and recurrent hospitalizations who was admitted on 05/22/2024 for management of epigastric and chest pain for the last week.  Patient had recently been hospitalized and discharged to long-term care facility with hospice on 05/09/2024.  Upon admission, patient found to have suspected acute cholecystitis; patient not a candidate for surgical interventions due to multiple comorbidities.  Patient also receiving management for acute on chronic end-stage systolic heart failure.  Palliative medicine team consulted to assist with complex medical decision making. Of note patient is known to PMT due to multiple hospital admissions.  Recommendations/Plan: # Complex medical decision making/goals of care:       - Discussed care with patient as detailed above in HPI.  Patient with known difficult social support circumstances.  Patient wanting to continue appropriate medical interventions at this time for management of suspected acute cholecystitis.  Patient has already agreed to DNR/DNI.  Patient initially requested hospice support upon discharge through a different  hospice agency.  TOC has determined that patient would have to return to his same long-term care facility which contracts with [facility] so  does not have  another hospice agent available.  TOC assisting with discharge planning.                -  Code Status: Limited: Do not attempt resuscitation (DNR) -DNR-LIMITED -Do Not Intubate/DNI     # Symptom management Patient is receiving these palliative interventions for symptom management with an intent to improve quality of life.                 -Pain, shortness of breath in setting of acute cholecystitis and acute on chronic end-stage HFrEF                               - Continue p.o. oxycodone  solution 7.5 mg every 4 hours as needed                               - Discontinue IV Dilaudid    # Psycho-social/Spiritual Support:  - Support System: Ryan Long   # Discharge Planning:  To Be Determined  Discussed with: Patient, hospitalist, RN, [facility]  Thank you for allowing the palliative care team to participate in the care [facility].  Barnett Libel, DO Palliative Care Provider PMT # [phone]  If patient remains symptomatic despite maximum doses, please call PMT at [phone] between 0700 and 1900. Outside of these hours, please call attending, as PMT does not have night coverage.

## 2024-05-26 NOTE — Progress Notes (Signed)
 PROGRESS NOTE    Ryan Long  UJW:[id number] DOB: Sep 29, 1961 DOA: 05/22/2024 PCP: Tretha Fu, MD    Brief Narrative:  63 year old gentleman with history of end-stage heart failure with known ejection fraction 20%, hypertension, hyperlipidemia, depression, bipolar disorder and HIV with recurrent hospitalization and discharged to long-term care facility with hospice on 5/23, was suffering from epigastric and chest pain for last 1 week revoked his hospice status, left the nursing home and asked his previous landlord to bring him to ER.  He has been suffering from right upper quadrant pain and intermittent chest pain. At the emergency room, complaining of chest pain relieved with morphine .  Tachycardic.  On room air.  Right upper quadrant ultrasound consistent with pericholecystic fluid without gallbladder stone.  Seen by surgery.  Possible acute cholecystitis, conservative management advised as patient is not a surgical candidate given his poor cardiovascular status.  Subjective:  Patient seen and examined.  Up walking and making his bed.  He was very happy to have his chest pain improved today.  He is able to eat some liquid diet without pain but is n.p.o. today for HIDA scan. Creatinine is up.  Will hold Lasix .   Assessment & Plan:   Suspected acute cholecystitis: Suspected with right upper quadrant pain and presence of pericholecystic fluid on ultrasound.  Currently fairly stable.  Patient with severe clinical morbidities not favorable for surgical treatment.  Discussed with surgery. Adequate pain medications.  Continue broad-spectrum antibiotics.  Tolerating diet. Will check HIDA scan today. If significant infection, will discuss with surgery or IR for cholecystostomy. Elevated bilirubin today.  Elevated transaminases which is likely due to hypovolemia and hypoperfusion.  Monitor closely.  End-stage systolic heart failure: Euvolemic except some evidence of ankle edema.   Torsemide  20 mg daily was resumed.  Hold today for AKI.  Chest pain: Chronic ongoing issues.  Very high risk of cardiac morbidity.  Not a candidate for ischemic evaluation. Continue Maxijul, midodrine . Adequate oral and IV opiates for pain relief.  Appreciate palliative care input.  Paroxysmal A-fib: Sinus rhythm.  On amiodarone .  Not on anticoagulation.  Decreasing dose of amiodarone  for renal functions.  HIV disease: Well-controlled.  Currently on Biktarvy .  AKI on CKD stage IIIa: Recent creatinine 1.06 -1.4.  Creatinine continues to go up 3.1 today.  Holding torsemide .  Cannot give IV fluid with ejection fraction 20%.  Recheck levels tomorrow morning.  Goal of care: Patient revoked hospice to get symptom relief and treated for infection.  We discussed in detail especially about his cardiac issues, severe congestive heart failure and high mortality.  Patient understands.  Currently wants to be treated for his gallbladder infection and get symptom relief.  Decided to continue antibiotics. CODE STATUS: DNR/DNI.   Appreciate palliative care follow-up.  Symptom management.   Anticipate long-term nursing home placement with hospice support.  He wants another hospice company to follow-up. Difficult disposition.  Patient with severe medical issues but not yet end-of-life.   DVT prophylaxis: SCDs Start: 05/23/24 0206   Code Status: DNR/DNI Family Communication: None at the bedside Disposition Plan: Status is: Inpatient Remains inpatient appropriate because: IV antibiotics, inpatient investigations.     Consultants:  General Surgery Palliative care  Procedures:  None  Antimicrobials:  Rocephin  and Flagyl  6/5---     Objective: Vitals:   05/25/24 1908 05/25/24 2328 05/26/24 0459 05/26/24 0900  BP: 103/77 (!) 134/111 101/68 (!) 89/72  Pulse: (!) 125 (!) 120  (!) 115  Resp: 20 20 19  Temp: 97.7 F (36.5 C)  98.1 F (36.7 C) (!) 97.5 F (36.4 C)  TempSrc: Oral  Oral Oral   SpO2: 100%  97% 96%    Intake/Output Summary (Last 24 hours) at 05/26/2024 1123 Last data filed at 05/25/2024 2138 Gross per 24 hour  Intake 100 ml  Output --  Net 100 ml   There were no vitals filed for this visit.  Examination:  General exam: Fairly comfortable today.  Walking around. Respiratory system: Clear to auscultation. Respiratory effort normal.  On room air. Cardiovascular system: S1 & S2 heard, RRR.  Trace bilateral pedal edema. Gastrointestinal system: Soft.  Mild tenderness right upper quadrant on deep palpation without rigidity or guarding.  Murphy sign equivocal.  Bowel sounds present. Central nervous system: Alert and oriented. No focal neurological deficits. Extremities: Symmetric 5 x 5 power.    Data Reviewed: I have personally reviewed following labs and imaging studies  CBC: Recent Labs  Lab 05/22/24 1700 05/23/24 0509 05/26/24 0516  WBC 5.2 3.1* 4.5  NEUTROABS 3.7  --  3.0  HGB 15.4 14.0 13.1  HCT 45.1 40.4 37.2*  MCV 100.0 98.8 97.1  PLT 160 122* 111*   Basic Metabolic Panel: Recent Labs  Lab 05/22/24 1700 05/22/24 2358 05/23/24 0509 05/26/24 0516  NA 138 138 141 136  K 5.8* 4.5 4.2 4.4  CL 106 106 112* 102  CO2 19* 19* 17* 18*  GLUCOSE 102* 79 73 110*  BUN 27* 26* 26* 57*  CREATININE 1.93* 1.68* 1.76* 3.11*  CALCIUM  9.3 8.8* 9.2 8.6*  MG  --  2.0 1.9 2.2  PHOS  --  3.7 3.4 6.5*   GFR: CrCl cannot be calculated (Unknown ideal weight.). Liver Function Tests: Recent Labs  Lab 05/22/24 1700 05/22/24 2358 05/23/24 0509 05/26/24 0516  AST 45* 39 38 591*  ALT 47* 45* 45* 611*  ALKPHOS 78 75 70 83  BILITOT 3.1* 2.4* 2.6* 2.6*  PROT 7.0 6.2* 6.0* 6.1*  ALBUMIN  4.0 3.7 3.4* 3.6   No results for input(s): "LIPASE", "AMYLASE" in the last 168 hours. No results for input(s): "AMMONIA" in the last 168 hours. Coagulation Profile: Recent Labs  Lab 05/22/24 2358  INR 1.3*   Cardiac Enzymes: Recent Labs  Lab 05/22/24 2358  CKTOTAL  67   BNP (last 3 results) No results for input(s): "PROBNP" in the last 8760 hours. HbA1C: No results for input(s): "HGBA1C" in the last 72 hours. CBG: No results for input(s): "GLUCAP" in the last 168 hours. Lipid Profile: No results for input(s): "CHOL", "HDL", "LDLCALC", "TRIG", "CHOLHDL", "LDLDIRECT" in the last 72 hours. Thyroid  Function Tests: Recent Labs    05/23/24 1518  TSH 1.054   Anemia Panel: No results for input(s): "VITAMINB12", "FOLATE", "FERRITIN", "TIBC", "IRON", "RETICCTPCT" in the last 72 hours. Sepsis Labs: Recent Labs  Lab 05/22/24 2358 05/23/24 0316  LATICACIDVEN 1.9 1.8    No results found for this or any previous visit (from the past 240 hours).       Radiology Studies: No results found.       Scheduled Meds:  [START ON 05/27/2024] amiodarone   100 mg Oral Daily   bictegravir-emtricitabine -tenofovir  AF  1 tablet Oral QAC breakfast   feeding supplement  1 Container Oral TID BM   finasteride   5 mg Oral q AM   fluticasone  furoate-vilanterol  1 puff Inhalation Daily   mexiletine  250 mg Oral Q12H   midodrine   30 mg Oral TID WC   pantoprazole   40  mg Oral Q0600   tamsulosin   0.4 mg Oral QPC supper   valACYclovir   500 mg Oral q AM   Continuous Infusions:  cefTRIAXone  (ROCEPHIN )  IV 2 g (05/25/24 2046)   metronidazole  500 mg (05/26/24 1040)   promethazine  (PHENERGAN ) injection (IM or IVPB) 6.25 mg (05/25/24 2138)     LOS: 4 days    Time spent: 55 minutes    Ryan Garibaldi, MD [facility]

## 2024-05-27 ENCOUNTER — Encounter (HOSPITAL_COMMUNITY): Payer: Self-pay | Admitting: Internal Medicine

## 2024-05-27 DIAGNOSIS — K810 Acute cholecystitis: Secondary | ICD-10-CM | POA: Diagnosis not present

## 2024-05-27 LAB — COMPREHENSIVE METABOLIC PANEL WITH GFR
ALT: 515 U/L — ABNORMAL HIGH (ref 0–44)
AST: 297 U/L — ABNORMAL HIGH (ref 15–41)
Albumin: 3.5 g/dL (ref 3.5–5.0)
Alkaline Phosphatase: 85 U/L (ref 38–126)
Anion gap: 14 (ref 5–15)
BUN: 51 mg/dL — ABNORMAL HIGH (ref 8–23)
CO2: 21 mmol/L — ABNORMAL LOW (ref 22–32)
Calcium: 8.4 mg/dL — ABNORMAL LOW (ref 8.9–10.3)
Chloride: 101 mmol/L (ref 98–111)
Creatinine, Ser: 2.67 mg/dL — ABNORMAL HIGH (ref 0.61–1.24)
GFR, Estimated: 26 mL/min — ABNORMAL LOW (ref >60)
Glucose, Bld: 82 mg/dL (ref 70–99)
Potassium: 3.1 mmol/L — ABNORMAL LOW (ref 3.5–5.1)
Sodium: 136 mmol/L (ref 135–145)
Total Bilirubin: 2.5 mg/dL — ABNORMAL HIGH (ref 0.0–1.2)
Total Protein: 5.8 g/dL — ABNORMAL LOW (ref 6.5–8.1)

## 2024-05-27 LAB — LACTIC ACID, PLASMA: Lactic Acid, Venous: 1.7 mmol/L (ref 0.5–1.9)

## 2024-05-27 MED ORDER — POTASSIUM CHLORIDE CRYS ER 20 MEQ PO TBCR
40.0000 meq | EXTENDED_RELEASE_TABLET | Freq: Two times a day (BID) | ORAL | Status: DC
  Administered 2024-05-27 (×2): 40 meq via ORAL
  Filled 2024-05-27 (×2): qty 2

## 2024-05-27 NOTE — Progress Notes (Signed)
 PROGRESS NOTE    Ryan Long  RUE:[id number] DOB: September 11, 1961 DOA: 05/22/2024 PCP: Tretha Fu, MD    Brief Narrative:  63 year old gentleman with history of end-stage heart failure with known ejection fraction 20%, hypertension, hyperlipidemia, depression, bipolar disorder and HIV with recurrent hospitalization and discharged to long-term care facility with hospice on 5/23, was suffering from epigastric and chest pain for last 1 week revoked his hospice status, left the nursing home and asked his previous landlord to bring him to ER.  He has been suffering from right upper quadrant pain and intermittent chest pain. At the emergency room, complaining of chest pain relieved with morphine .  Tachycardic.  On room air.  Right upper quadrant ultrasound consistent with pericholecystic fluid without gallbladder stone.  Seen by surgery.  Possible acute cholecystitis, conservative management advised as patient is not a surgical candidate given his poor cardiovascular status. Cholecystitis ruled out. Pain management with the help of palliative care team. Patient does not want to go back to long-term care.  He has adequate arrangement made to go home tomorrow.  Subjective:  Patient seen and examined.  his chest pain and abdominal pain has improved.  Able to eat some soft diet. HIDA scan negative. Patient wants to go home with his previous landlord tomorrow.  Recommended hospice care as outpatient so that he can have access to pain medication and symptom management.  He is agreeable.   Assessment & Plan:   Suspected acute cholecystitis: Presented with right upper quadrant abdominal pain and pericholecystic fluid.  Equivocal ultrasound.  Treated with 5 days of Rocephin  and Flagyl .  HIDA scan was negative for evidence of cholecystitis.  Antibiotics discontinued.  Suspect gallbladder edema secondary to congestive heart failure.  End-stage systolic heart failure: Euvolemic except some  evidence of ankle edema.  Resume home dose of torsemide  once potassium is corrected.  Chest pain: Chronic ongoing issues.  Very high risk of cardiac morbidity.  Not a candidate for ischemic evaluation. Continue Maxijul, midodrine . Seen by palliative care team.  Pain medication adjusted and currently achieved symptom control. Plan is to discharge him on current pain medication regimen as well as benzodiazepines and have hospice take over at home.  Paroxysmal A-fib: Sinus rhythm.  On amiodarone .  Not on anticoagulation.  Decreasing dose of amiodarone  for renal functions.  HIV disease: Well-controlled.  Currently on Biktarvy .  AKI on CKD stage IIIa: Recent creatinine 1.06 -1.4.  Creatinine continues to go up 3.1-2.67 today.   Continue to hold torsemide  today.  Replacing potassium.   Can likely go back on torsemide  tomorrow on discharge.  Goal of care: Patient was discharged to nursing home for long-term care and hospice in place.  Revoked hospice to get symptom relief and treated for infection.  We discussed in detail especially about his cardiac issues, severe congestive heart failure and high mortality.  Patient understands.  CODE STATUS: DNR/DNI.   Appreciate palliative care follow-up.  Symptom management.   Patient is independent and can live at home with minimal support.  He has decided to go home with hospice support from another agency. Will need pain medications and benzodiazepines on discharge and hospice to continue ongoing care. Offered to discharge today, however he does not have his room assigned yet by his landlord and he may be picked up tomorrow.    DVT prophylaxis: SCDs Start: 05/23/24 0206   Code Status: DNR/DNI Family Communication: None.  Patient does not have any family. Disposition Plan: Status is: Inpatient Remains inpatient appropriate because:  Medically stable.  Waiting for arrangements to be discharged home.     Consultants:  General Surgery Palliative  care  Procedures:  None  Antimicrobials:  Rocephin  and Flagyl  6/5--- 6/9     Objective: Vitals:   05/26/24 2008 05/27/24 0601 05/27/24 0649 05/27/24 0951  BP: 91/79 99/75  94/82  Pulse:    (!) 112  Resp: 18 19    Temp: 97.7 F (36.5 C) 97.7 F (36.5 C)  (!) 97.3 F (36.3 C)  TempSrc: Oral Oral  Oral  SpO2: 92% 92% 100% 100%   No intake or output data in the 24 hours ending 05/27/24 1151  There were no vitals filed for this visit.  Examination:  General exam: Fairly comfortable today.  Pleasant interactive. Respiratory system: Long to auscultation. Respiratory effort normal.  On room air. Cardiovascular system: S1 & S2 heard, RRR.  Trace bilateral pedal edema. Gastrointestinal system: Soft.  Mild tenderness right upper quadrant on deep palpation without rigidity or guarding.  Bowel sounds present. Central nervous system: Alert and oriented. No focal neurological deficits. Extremities: Symmetric 5 x 5 power.    Data Reviewed: I have personally reviewed following labs and imaging studies  CBC: Recent Labs  Lab 05/22/24 1700 05/23/24 0509 05/26/24 0516  WBC 5.2 3.1* 4.5  NEUTROABS 3.7  --  3.0  HGB 15.4 14.0 13.1  HCT 45.1 40.4 37.2*  MCV 100.0 98.8 97.1  PLT 160 122* 111*   Basic Metabolic Panel: Recent Labs  Lab 05/22/24 1700 05/22/24 2358 05/23/24 0509 05/26/24 0516 05/27/24 0508  NA 138 138 141 136 136  K 5.8* 4.5 4.2 4.4 3.1*  CL 106 106 112* 102 101  CO2 19* 19* 17* 18* 21*  GLUCOSE 102* 79 73 110* 82  BUN 27* 26* 26* 57* 51*  CREATININE 1.93* 1.68* 1.76* 3.11* 2.67*  CALCIUM  9.3 8.8* 9.2 8.6* 8.4*  MG  --  2.0 1.9 2.2  --   PHOS  --  3.7 3.4 6.5*  --    GFR: CrCl cannot be calculated (Unknown ideal weight.). Liver Function Tests: Recent Labs  Lab 05/22/24 1700 05/22/24 2358 05/23/24 0509 05/26/24 0516 05/27/24 0508  AST 45* 39 38 591* 297*  ALT 47* 45* 45* 611* 515*  ALKPHOS 78 75 70 83 85  BILITOT 3.1* 2.4* 2.6* 2.6* 2.5*   PROT 7.0 6.2* 6.0* 6.1* 5.8*  ALBUMIN  4.0 3.7 3.4* 3.6 3.5   No results for input(s): "LIPASE", "AMYLASE" in the last 168 hours. No results for input(s): "AMMONIA" in the last 168 hours. Coagulation Profile: Recent Labs  Lab 05/22/24 2358  INR 1.3*   Cardiac Enzymes: Recent Labs  Lab 05/22/24 2358  CKTOTAL 67   BNP (last 3 results) No results for input(s): "PROBNP" in the last 8760 hours. HbA1C: No results for input(s): "HGBA1C" in the last 72 hours. CBG: No results for input(s): "GLUCAP" in the last 168 hours. Lipid Profile: No results for input(s): "CHOL", "HDL", "LDLCALC", "TRIG", "CHOLHDL", "LDLDIRECT" in the last 72 hours. Thyroid  Function Tests: No results for input(s): "TSH", "T4TOTAL", "FREET4", "T3FREE", "THYROIDAB" in the last 72 hours.  Anemia Panel: No results for input(s): "VITAMINB12", "FOLATE", "FERRITIN", "TIBC", "IRON", "RETICCTPCT" in the last 72 hours. Sepsis Labs: Recent Labs  Lab 05/22/24 2358 05/23/24 0316 05/27/24 0508  LATICACIDVEN 1.9 1.8 1.7    No results found for this or any previous visit (from the past 240 hours).       Radiology Studies: NM Hepatobiliary Liver Func Result Date:  05/26/2024 CLINICAL DATA:  Abdominal pain, upper, chronic, assess gallbladder motility Gallbladder wall thickening and pericholecystic fluid on ultrasound, without sonographic Murphy sign. EXAM: NUCLEAR MEDICINE HEPATOBILIARY IMAGING TECHNIQUE: Sequential images of the abdomen were obtained out to 60 minutes following intravenous administration of radiopharmaceutical. RADIOPHARMACEUTICALS:  7.22 mCi Tc-44m  Choletec IV COMPARISON:  Abdominal ultrasound 05/22/2024 and 03/18/2024. Abdominopelvic CT 05/22/2024 and 03/18/2024. FINDINGS: Study is mildly motion degraded. Prompt uptake and biliary excretion of activity by the liver is seen. Biliary activity passes into the small bowel, consistent with patency of the common bile duct. There was delayed filling of the  gallbladder lumen, consistent with patency of the cystic duct. No evidence of biliary leak or obstruction. IMPRESSION: 1. The cystic and common bile ducts are patent. No evidence of cholecystitis. 2. No evidence of bile leak. Electronically Signed   By: Elmon Hagedorn M.D.   On: 05/26/2024 17:43         Scheduled Meds:  amiodarone   100 mg Oral Daily   bictegravir-emtricitabine -tenofovir  AF  1 tablet Oral QAC breakfast   feeding supplement  237 mL Oral TID BM   finasteride   5 mg Oral q AM   fluticasone  furoate-vilanterol  1 puff Inhalation Daily   mexiletine  250 mg Oral Q12H   midodrine   30 mg Oral TID WC   pantoprazole   40 mg Oral Q0600   potassium chloride   40 mEq Oral BID   tamsulosin   0.4 mg Oral QPC supper   valACYclovir   500 mg Oral q AM   Continuous Infusions:  promethazine  (PHENERGAN ) injection (IM or IVPB) 6.25 mg (05/27/24 0646)     LOS: 5 days    Time spent: 55 minutes    Vada Garibaldi, MD [facility]

## 2024-05-27 NOTE — TOC Progression Note (Addendum)
 Transition of Care [facility]) - Progression Note    Patient Details  Name: Ryan Long MRN: [id number] Date of Birth: 01-23-1961  [facility] CM/SW Contact  Abisai Deer, Thersia Flax, RN Phone Number: 05/27/2024, 10:25 AM  Clinical Narrative:  Spoke to patient about d/c plans-declines LTC or [facility] or [facility].He prefers home-he already has rollator, home safe, he will get private duty on his own-custodial level help(google private duty), agrees to 3n1-no preference [employer] to deliver to rm prior d/c. Has own transport home.  -12p Patient agrees to [facility] for home w/hospice-rep Emeterio Hansen accepted. No further CM needs.    Expected Discharge Plan: Home/Self Care Barriers to Discharge: Continued Medical Work up  Expected Discharge Plan and Services In-house Referral: NA Discharge Planning Services: CM Consult   Living arrangements for the past 2 months: Single Family Home                 DME Arranged: N/A DME Agency: NA       HH Arranged: NA HH Agency: NA         Social Determinants of Health (SDOH) Interventions SDOH Screenings   Food Insecurity: No Food Insecurity (05/23/2024)  Housing: Low Risk  (05/23/2024)  Transportation Needs: No Transportation Needs (05/23/2024)  Utilities: Not At Risk (05/23/2024)  Alcohol  Screen: Low Risk  (07/17/2023)  Depression (PHQ2-9): Low Risk  (08/01/2023)  Financial Resource Strain: Low Risk  (07/17/2023)  Physical Activity: Insufficiently Active (07/17/2023)  Social Connections: Socially Isolated (04/19/2024)  Stress: Stress Concern Present (07/17/2023)  Tobacco Use: High Risk (04/19/2024)  Health Literacy: Adequate Health Literacy (07/18/2023)    Readmission Risk Interventions    05/26/2024    1:15 PM 03/19/2024    3:54 PM  Readmission Risk Prevention Plan  Transportation Screening Complete Complete  PCP or Specialist Appt within 3-5 Days  Complete  HRI or Home Care Consult  Complete  Medication Review (RN Care Manager) Complete  Complete  PCP or Specialist appointment within 3-5 days of discharge Complete   SW Recovery Care/Counseling Consult Complete   Palliative Care Screening Not Applicable   Skilled Nursing Facility Complete

## 2024-05-27 NOTE — Progress Notes (Signed)
 Daily Progress Note   Patient Name: Ryan Long       Date: 05/27/2024 DOB: [birth year]  Age: 63 y.o. MRN#: [id number] Attending Physician: Vada Garibaldi, MD Primary Care Physician: Tretha Fu, MD Admit Date: 05/22/2024 Length of Stay: 5 days  Reason for Consultation/Follow-up: Establishing goals of care  Subjective:   CC: Patient resting in bed, no distress, denies acute uncontrolled pain, no visitors at bedside.  Following up regarding complex medical decision making and symptom management.  Subjective:  Reviewed recent EMR documentation from hospitalist and TOC.   Medication history also noted.  Patient is resting in bed.  Goals of care and disposition options discussed with the patient. Patient states that he doesn't want to go back to the long term care facility he came from - "that place is a dump, did you know it has 1 star out of 5 rating, I ain't going back there." Patient also reluctant to have [facility] follow him.  Patient says he wants to go back home.    Review of Systems Pain, shortness of breath improving Objective:   Vital Signs:  BP 94/77 (BP Location: Left Arm)   Pulse (!) 117   Temp (!) 97.5 F (36.4 C) (Oral)   Resp 18   SpO2 100%   Physical Exam: General: NAD, alert, laying in bed, chronically ill appearing Cardiovascular: tachycardia noted Respiratory: No increased work of breathing noted, not in respiratory distress Neuro: Awake, interactive Psych: appropriately answers all questions, pleasant   Assessment & Plan:   Assessment: Patient is a 63 year old male with past medical history of end-stage heart failure with known ejection fraction of 20%, hypertension, hyperlipidemia, depression, bipolar disorder, HIV, and recurrent hospitalizations who was admitted on 05/22/2024 for management of epigastric and chest pain for the last week.  Patient had recently been hospitalized and discharged to long-term care facility with  hospice on 05/09/2024.  Upon admission, patient found to have suspected acute cholecystitis; patient not a candidate for surgical interventions due to multiple comorbidities.  Patient also receiving management for acute on chronic end-stage systolic heart failure.  Palliative medicine team consulted to assist with complex medical decision making. Of note patient is known to PMT due to multiple hospital admissions.  Recommendations/Plan: # Complex medical decision making/goals of care:       - Discussed care with patient as detailed above in HPI.  Patient with known difficult social support circumstances.  Patient wanting to continue appropriate medical interventions at this time for management of suspected acute cholecystitis.  Patient has already agreed to DNR/DNI.   TOC assisting with discharge planning, appreciate their assistance.                  -  Code Status: Limited: Do not attempt resuscitation (DNR) -DNR-LIMITED -Do Not Intubate/DNI     # Symptom management Patient is receiving these palliative interventions for symptom management with an intent to improve quality of life.                 -Pain, shortness of breath in setting of acute cholecystitis and acute on chronic end-stage HFrEF                               - Continue p.o. oxycodone  solution 7.5 mg every 4 hours as needed                               -  Discontinue IV Dilaudid    # Psycho-social/Spiritual Support:  - Support System: Arvis Bison   # Discharge Planning:  To Be Determined  Discussed with: Patient  Thank you for allowing the palliative care team to participate in the care [facility].  Mod MDM Lujean Sake MD Palliative Care Provider PMT # [phone]  If patient remains symptomatic despite maximum doses, please call PMT at [phone] between 0700 and 1900. Outside of these hours, please call attending, as PMT does not have night coverage.

## 2024-05-28 ENCOUNTER — Other Ambulatory Visit (HOSPITAL_COMMUNITY): Payer: Self-pay

## 2024-05-28 ENCOUNTER — Other Ambulatory Visit: Payer: Self-pay

## 2024-05-28 DIAGNOSIS — I5022 Chronic systolic (congestive) heart failure: Secondary | ICD-10-CM | POA: Diagnosis not present

## 2024-05-28 DIAGNOSIS — R7989 Other specified abnormal findings of blood chemistry: Secondary | ICD-10-CM | POA: Insufficient documentation

## 2024-05-28 MED ORDER — PROCHLORPERAZINE EDISYLATE 10 MG/2ML IJ SOLN
10.0000 mg | Freq: Four times a day (QID) | INTRAMUSCULAR | Status: DC | PRN
  Administered 2024-05-28 – 2024-05-29 (×3): 10 mg via INTRAVENOUS
  Filled 2024-05-28 (×3): qty 2

## 2024-05-28 MED ORDER — POTASSIUM CHLORIDE CRYS ER 20 MEQ PO TBCR
40.0000 meq | EXTENDED_RELEASE_TABLET | ORAL | Status: AC
  Administered 2024-05-28 (×2): 40 meq via ORAL
  Filled 2024-05-28 (×2): qty 2

## 2024-05-28 MED ORDER — MORPHINE SULFATE (CONCENTRATE) 20 MG/ML PO SOLN
10.0000 mg | ORAL | 0 refills | Status: DC | PRN
  Filled 2024-05-28: qty 30, 10d supply, fill #0

## 2024-05-28 MED ORDER — PROCHLORPERAZINE MALEATE 10 MG PO TABS
10.0000 mg | ORAL_TABLET | ORAL | 0 refills | Status: DC | PRN
  Filled 2024-05-28: qty 42, 7d supply, fill #0

## 2024-05-28 MED ORDER — LORAZEPAM 0.5 MG PO TABS
0.5000 mg | ORAL_TABLET | ORAL | 0 refills | Status: DC | PRN
  Filled 2024-05-28: qty 42, 7d supply, fill #0

## 2024-05-28 NOTE — Progress Notes (Signed)
  PROGRESS NOTE  Patient was supposed to discharge with home hospice today. However, I was notified by RN that patient was not feeling well, did not think that he could go home today.  I also checked a repeat EKG which showed a flutter which is chronic from previous EKGs.  I evaluated patient at bedside, patient reported continued nausea, some shortness of breath as well as chest discomfort.  Patient has end-stage systolic heart failure as well as chronic chest pain, was noted to not be a candidate for ischemic evaluation.  He was supposed to go home with home hospice.  I changed his nausea medication to Compazine  due to his history of prolonged QT.  Can hold his discharge and keep another night.  Hopeful discharge home in the morning with home hospice.  Also updated his bedside RN.   Ryan Eck, DO [facility] 05/28/2024, 2:17 PM  Available via Epic secure chat 7am-7pm After these hours, please refer to coverage provider listed on [email or web address]

## 2024-05-28 NOTE — TOC Transition Note (Addendum)
 Transition of Care [facility]) - Discharge Note   Patient Details  Name: Ryan Long MRN: [id number] Date of Birth: 1961/11/07  Transition [facility] Care [facility] CM/SW Contact:  Ruben Corolla, RN Phone Number: 05/28/2024, 9:55 AM   Clinical Narrative:  Home hospice w/[facility] all set up. ensure 3n1 in rm prior d/c. Has own transport.No further CM needs.   -12:20p-Please check if home 02 needed. I can arrange -12:47p-per attending no home 02 needed-[facility] home hospice rep Alisa App aware-they will have [employer] to deliver 3n1 to rm prior d/c. Patient has own transport home.     Final next level of care: Home w Hospice Care Barriers to Discharge: No Barriers Identified   Patient Goals and CMS Choice Patient states their goals for this hospitalization and ongoing recovery are:: Home CMS Medicare.gov Compare Post Acute Care list provided to:: Patient Choice offered to / list presented to : Patient [place] ownership interest in [facility].provided to:: Patient    Discharge Placement                       Discharge Plan and Services Additional resources added to the After Visit Summary for   In-house Referral: NA Discharge Planning Services: CM Consult Post Acute Care Choice: Hospice          DME Arranged: 3-N-1 DME Agency: [employer]       HH Arranged: RN HH Agency: Other - See comment (Home hospice [facility])        Social Drivers of Health (SDOH) Interventions SDOH Screenings   Food Insecurity: No Food Insecurity (05/23/2024)  Housing: Low Risk  (05/23/2024)  Transportation Needs: No Transportation Needs (05/23/2024)  Utilities: Not At Risk (05/23/2024)  Alcohol  Screen: Low Risk  (07/17/2023)  Depression (PHQ2-9): Low Risk  (08/01/2023)  Financial Resource Strain: Low Risk  (07/17/2023)  Physical Activity: Insufficiently Active (07/17/2023)  Social Connections: Socially Isolated (04/19/2024)  Stress: Stress Concern Present (07/17/2023)  Tobacco Use:  High Risk (05/27/2024)  Health Literacy: Adequate Health Literacy (07/18/2023)     Readmission Risk Interventions    05/26/2024    1:15 PM 03/19/2024    3:54 PM  Readmission Risk Prevention Plan  Transportation Screening Complete Complete  PCP or Specialist Appt within 3-5 Days  Complete  HRI or Home Care Consult  Complete  Medication Review (RN Care Manager) Complete Complete  PCP or Specialist appointment within 3-5 days of discharge Complete   SW Recovery Care/Counseling Consult Complete   Palliative Care Screening Not Applicable   Skilled Nursing Facility Complete

## 2024-05-28 NOTE — Discharge Summary (Addendum)
 Physician Discharge Summary  Ryan Long KGM:[id number] DOB: 07-02-1961 DOA: 05/22/2024  PCP: Tretha Fu, MD  Admit date: 05/22/2024 Discharge date: 05/29/2024  Disposition:  Home with home hospice   Recommendations for Outpatient Follow-up:  Follow up with PCP   Discharge Condition: Stable CODE STATUS: DNR  Diet recommendation: Heart healthy  Brief/Interim Summary: [name] is a 63 year old gentleman with history of end-stage heart failure with known ejection fraction 20%, hypertension, hyperlipidemia, depression, bipolar disorder and HIV with recurrent hospitalization and discharged to long-term care facility with hospice on 5/23, was suffering from epigastric and chest pain for last 1 week revoked his hospice status, left the nursing home and asked his previous landlord to bring him to ER.  He has been suffering from right upper quadrant pain and intermittent chest pain. At the emergency room, complaining of chest pain relieved with morphine .  Tachycardic.  On room air.  Right upper quadrant ultrasound consistent with pericholecystic fluid without gallbladder stone.  Seen by surgery.  Possible acute cholecystitis, conservative management advised as patient is not a surgical candidate given his poor cardiovascular status. Cholecystitis ruled out with negative HIDA scan.  On day of discharge, patient was stable.  No vomiting, tolerating diet but some mild nausea.  He did not want to return home to long-term care facility, wished to go home with home hospice.  Discharge Diagnoses:   Principal Problem:   End-stage systolic heart failure, chronic (HCC) Active Problems:   Hypokalemia   Chest pain   Paroxysmal atrial fibrillation (HCC)   HIV disease (HCC)   COPD (chronic obstructive pulmonary disease) (HCC)   CKD stage 3a, GFR 45-59 ml/min (HCC)   QT prolongation   Polycythemia   Protein-calorie malnutrition, moderate (HCC)   Hyperkalemia   AKI (acute kidney  injury) (HCC)   Acute urinary retention   DNR (do not resuscitate)   Shortness of breath   Pain   Counseling and coordination of care   Palliative care encounter   High risk medication use   Medication management   Elevated LFTs  Cholecystitis ruled out with negative HIDA scan.  Suspect gallbladder edema secondary to CHF.   Discharge Instructions  Discharge Instructions     (HEART FAILURE PATIENTS) Call MD:  Anytime you have any of the following symptoms: 1) 3 pound weight gain in 24 hours or 5 pounds in 1 week 2) shortness of breath, with or without a dry hacking cough 3) swelling in the hands, feet or stomach 4) if you have to sleep on extra pillows at night in order to breathe.   Complete by: As directed    Call MD for:  difficulty breathing, headache or visual disturbances   Complete by: As directed    Call MD for:  extreme fatigue   Complete by: As directed    Call MD for:  persistant dizziness or light-headedness   Complete by: As directed    Call MD for:  persistant nausea and vomiting   Complete by: As directed    Call MD for:  severe uncontrolled pain   Complete by: As directed    Call MD for:  temperature >100.4   Complete by: As directed    Diet - low sodium heart healthy   Complete by: As directed    Increase activity slowly   Complete by: As directed       Allergies as of 05/29/2024   No Known Allergies      Medication List  STOP taking these medications    morphine  10 MG/5ML solution Replaced by: morphine  CONCENTRATE 10 mg / 0.5 ml concentrated solution   ondansetron  4 MG tablet Commonly known as: Zofran        TAKE these medications    acetaminophen  325 MG tablet Commonly known as: TYLENOL  Take 2 tablets (650 mg total) by mouth every 6 (six) hours as needed for mild pain (pain score 1-3) (or Fever >/= 101).   albuterol  108 (90 Base) MCG/ACT inhaler Commonly known as: VENTOLIN  HFA Inhale 2 puffs into the lungs every 6 (six) hours as  needed for wheezing or shortness of breath.   amiodarone  200 MG tablet Commonly known as: PACERONE  Take 1 tablet (200 mg total) by mouth daily.   aspirin  EC 81 MG tablet Take 81 mg by mouth in the morning. Swallow whole.   Biktarvy  50-200-25 MG Tabs tablet Generic drug: bictegravir-emtricitabine -tenofovir  AF Take 1 tablet by mouth daily. What changed: when to take this   finasteride  5 MG tablet Commonly known as: PROSCAR  TAKE 1 TABLET(5 MG) BY MOUTH DAILY What changed: See the new instructions.   LORazepam  1 MG tablet Commonly known as: ATIVAN  Take 1 tablet (1 mg total) by mouth every 3 (three) hours as needed for anxiety. What changed: when to take this   mexiletine 250 MG capsule Commonly known as: MEXITIL  Take 1 capsule (250 mg total) by mouth every 12 (twelve) hours.   midodrine  10 MG tablet Commonly known as: PROAMATINE  Take 3 tablets (30 mg total) by mouth 3 (three) times daily with meals.   morphine  CONCENTRATE 10 mg / 0.5 ml concentrated solution Take 0.5 mLs (10 mg total) by mouth every 4 (four) hours as needed for severe pain (pain score 7-10). May give sublingually if needed. Replaces: morphine  10 MG/5ML solution   nitroGLYCERIN  0.4 MG SL tablet Commonly known as: NITROSTAT  Place 0.4 mg under the tongue every 5 (five) minutes as needed for chest pain.   oxyCODONE  10 mg 12 hr tablet Commonly known as: OXYCONTIN  Take 1 tablet (10 mg total) by mouth every 12 (twelve) hours.   pantoprazole  40 MG tablet Commonly known as: PROTONIX  Take 1 tablet (40 mg total) by mouth daily at 6 (six) AM.   polyethylene glycol 17 g packet Commonly known as: MIRALAX  / GLYCOLAX  Take 17 g by mouth daily. What changed:  when to take this reasons to take this   potassium chloride  SA 20 MEQ tablet Commonly known as: KLOR-CON  M Take 2 tablets (40 mEq total) by mouth daily.   prochlorperazine  10 MG tablet Commonly known as: COMPAZINE  Take 1 tablet (10 mg total) by mouth every  4 (four) hours as needed for nausea or vomiting. May crush, mix with water  and give sublingually.   senna 8.6 MG Tabs tablet Commonly known as: SENOKOT Take 2 tablets (17.2 mg total) by mouth daily as needed for mild constipation.   Symbicort 80-4.5 MCG/ACT inhaler Generic drug: budesonide-formoterol  Inhale 2 puffs into the lungs in the morning and at bedtime.   tamsulosin  0.4 MG Caps capsule Commonly known as: FLOMAX  Take 2 capsules (0.8 mg total) by mouth daily. What changed:  how much to take when to take this   torsemide  20 MG tablet Commonly known as: DEMADEX  Take 1 tablet (20 mg total) by mouth 2 (two) times daily.   traZODone  150 MG tablet Commonly known as: DESYREL  Take 150 mg by mouth at bedtime as needed for sleep.   valACYclovir  500 MG tablet Commonly known as:  VALTREX  Take 1 tablet (500 mg total) by mouth daily. What changed: when to take this               Durable Medical Equipment  (From admission, onward)           Start     Ordered   05/27/24 1032  For home use only DME 3 n 1  Once        05/27/24 1031            Follow-up Information     Maczis, Puja Gosai, PA-C Follow up.   Specialty: General Surgery Why: our office is scheduling you for post-operative follow up in about 4 weeks. call to confirm appointment date/time. Contact information: [address] [place] [address] [phone]         [facility] Follow up.   Why: [facility] hospice;[facility] to deliver 3n1 to rm Contact information: [address] [place] [address] [phone]         Tretha Fu, MD Follow up.   Specialty: Internal Medicine Contact information: [address] [place] [address] [phone]                No Known Allergies    Procedures/Studies: NM Hepatobiliary Liver Func Result Date: 05/26/2024 CLINICAL DATA:  Abdominal pain, upper, chronic, assess gallbladder motility  Gallbladder wall thickening and pericholecystic fluid on ultrasound, without sonographic Murphy sign. EXAM: NUCLEAR MEDICINE HEPATOBILIARY IMAGING TECHNIQUE: Sequential images of the abdomen were obtained out to 60 minutes following intravenous administration of radiopharmaceutical. RADIOPHARMACEUTICALS:  7.22 mCi Tc-30m  Choletec IV COMPARISON:  Abdominal ultrasound 05/22/2024 and 03/18/2024. Abdominopelvic CT 05/22/2024 and 03/18/2024. FINDINGS: Study is mildly motion degraded. Prompt uptake and biliary excretion of activity by the liver is seen. Biliary activity passes into the small bowel, consistent with patency of the common bile duct. There was delayed filling of the gallbladder lumen, consistent with patency of the cystic duct. No evidence of biliary leak or obstruction. IMPRESSION: 1. The cystic and common bile ducts are patent. No evidence of cholecystitis. 2. No evidence of bile leak. Electronically Signed   By: Elmon Hagedorn M.D.   On: 05/26/2024 17:43   US  Abdomen Limited RUQ (LIVER/GB) Result Date: 05/22/2024 CLINICAL DATA:  Cholecystitis. EXAM: ULTRASOUND ABDOMEN LIMITED RIGHT UPPER QUADRANT COMPARISON:  CT abdomen and pelvis 05/22/2024 FINDINGS: Gallbladder: There is gallbladder wall thickening measuring 4.8 mm. Pericholecystic is present. No calculi are seen. Sonographic Abigail Abler sign is negative. Common bile duct: Diameter: 4.1 mm Liver: No focal lesion identified. Mildly nodular contour. Within normal limits in parenchymal echogenicity. Portal vein is patent on color Doppler imaging with normal direction of blood flow towards the liver. Other: Trace free fluid in the right upper quadrant. IMPRESSION: 1. Gallbladder wall thickening and pericholecystic fluid. No calculi are seen. Sonographic Abigail Abler sign is negative. Findings are equivocal for acute cholecystitis. 2. Mildly nodular contour of the liver, which can be seen in the setting of cirrhosis. 3. Trace free fluid in the right upper quadrant.  Electronically Signed   By: Tyron Gallon M.D.   On: 05/22/2024 22:20   CT Angio Chest PE W and/or Wo Contrast Result Date: 05/22/2024 CLINICAL DATA:  None EXAM: CT ANGIOGRAPHY CHEST CT ABDOMEN AND PELVIS WITH CONTRAST TECHNIQUE: Multidetector CT imaging of the chest was performed using the standard protocol during bolus administration of intravenous contrast. Multiplanar CT image reconstructions and MIPs were obtained to evaluate the vascular anatomy. Multidetector  CT imaging of the abdomen and pelvis was performed using the standard protocol during bolus administration of intravenous contrast. RADIATION DOSE REDUCTION: This exam was performed according to the departmental dose-optimization program which includes automated exposure control, adjustment of the mA and/or kV according to patient size and/or use of iterative reconstruction technique. CONTRAST:  80mL OMNIPAQUE  IOHEXOL  350 MG/ML SOLN COMPARISON:  None Available. FINDINGS: CTA CHEST FINDINGS Cardiovascular: Mild to moderate global cardiomegaly. There is adequate opacification of the pulmonary arterial tree. No intraluminal filling defect identified to suggest acute pulmonary embolism. The central pulmonary arteries are of normal caliber. Reflux of contrast into the hepatic venous systems in keeping with at least some degree of right heart failure. No pericardial effusion. Mild atherosclerotic calcification within the thoracic aorta. Left single lead transvenous pacemaker is in place with its lead within the right ventricle. Mediastinum/Nodes: No enlarged mediastinal, hilar, or axillary lymph nodes. Thyroid  gland, trachea, and esophagus demonstrate no significant findings. Lungs/Pleura: Lungs are clear. No pleural effusion or pneumothorax. Musculoskeletal: No chest wall abnormality. No acute or significant osseous findings. Review of the MIP images confirms the above findings. CT ABDOMEN and PELVIS FINDINGS Hepatobiliary: There is gallbladder wall  thickening and pericholecystic edema, nonspecific in the setting trace perihepatic ascites and evidence of cardiogenic failure. No enhancing intrahepatic mass. No intra or extrahepatic biliary ductal dilation. Pancreas: Unremarkable Spleen: Unremarkable Adrenals/Urinary Tract: The adrenal glands are unremarkable. Striated bilateral cortical nephrogram has progressed slightly since prior examination suggesting an infectious or inflammatory process as can be seen with pyelonephritis and/or acute kidney injury. The kidneys are normal in size and position. No hydronephrosis. No intrarenal or ureteral calculi. The bladder is unremarkable. Stomach/Bowel: Trace perihepatic ascites. Stomach, small bowel, and large bowel are unremarkable save for mild sigmoid diverticulosis. Appendix normal. No free intraperitoneal gas. Vascular/Lymphatic: No significant vascular findings are present. No enlarged abdominal or pelvic lymph nodes. Reproductive: Prostate is unremarkable. Other: No abdominal wall hernia. Mild diffuse subcutaneous body wall edema. Musculoskeletal: No acute or significant osseous findings. Review of the MIP images confirms the above findings. IMPRESSION: 1. No acute pulmonary embolism. 2. Mild to moderate global cardiomegaly. Reflux of contrast into the hepatic venous systems in keeping with at least some degree of right heart failure. 3. Striated bilateral cortical nephrogram has progressed slightly since prior examination suggesting an infectious or inflammatory process as can be seen with pyelonephritis and/or acute kidney injury. Correlation with urinalysis and renal function tests are recommended. 4. Gallbladder wall thickening and pericholecystic edema, nonspecific in the setting of trace perihepatic ascites and evidence of cardiogenic failure. While this can simply represent a component of congestive hepatopathy, acute inflammatory conditions of the liver, biliary tree or gallbladder itself including acute  cholecystitis, could appear in this fashion. Right upper quadrant sonography and or hepatic biliary scintigraphy may be helpful for further evaluation if there is clinical concern for acute cholecystitis. 5. Mild sigmoid diverticulosis. 6. Aortic atherosclerosis. Aortic Atherosclerosis (ICD10-I70.0). Electronically Signed   By: Worthy Heads M.D.   On: 05/22/2024 21:23   CT ABDOMEN PELVIS W CONTRAST Result Date: 05/22/2024 CLINICAL DATA:  None EXAM: CT ANGIOGRAPHY CHEST CT ABDOMEN AND PELVIS WITH CONTRAST TECHNIQUE: Multidetector CT imaging of the chest was performed using the standard protocol during bolus administration of intravenous contrast. Multiplanar CT image reconstructions and MIPs were obtained to evaluate the vascular anatomy. Multidetector CT imaging of the abdomen and pelvis was performed using the standard protocol during bolus administration of intravenous contrast. RADIATION DOSE REDUCTION: This exam  was performed according to the departmental dose-optimization program which includes automated exposure control, adjustment of the mA and/or kV according to patient size and/or use of iterative reconstruction technique. CONTRAST:  80mL OMNIPAQUE  IOHEXOL  350 MG/ML SOLN COMPARISON:  None Available. FINDINGS: CTA CHEST FINDINGS Cardiovascular: Mild to moderate global cardiomegaly. There is adequate opacification of the pulmonary arterial tree. No intraluminal filling defect identified to suggest acute pulmonary embolism. The central pulmonary arteries are of normal caliber. Reflux of contrast into the hepatic venous systems in keeping with at least some degree of right heart failure. No pericardial effusion. Mild atherosclerotic calcification within the thoracic aorta. Left single lead transvenous pacemaker is in place with its lead within the right ventricle. Mediastinum/Nodes: No enlarged mediastinal, hilar, or axillary lymph nodes. Thyroid  gland, trachea, and esophagus demonstrate no significant  findings. Lungs/Pleura: Lungs are clear. No pleural effusion or pneumothorax. Musculoskeletal: No chest wall abnormality. No acute or significant osseous findings. Review of the MIP images confirms the above findings. CT ABDOMEN and PELVIS FINDINGS Hepatobiliary: There is gallbladder wall thickening and pericholecystic edema, nonspecific in the setting trace perihepatic ascites and evidence of cardiogenic failure. No enhancing intrahepatic mass. No intra or extrahepatic biliary ductal dilation. Pancreas: Unremarkable Spleen: Unremarkable Adrenals/Urinary Tract: The adrenal glands are unremarkable. Striated bilateral cortical nephrogram has progressed slightly since prior examination suggesting an infectious or inflammatory process as can be seen with pyelonephritis and/or acute kidney injury. The kidneys are normal in size and position. No hydronephrosis. No intrarenal or ureteral calculi. The bladder is unremarkable. Stomach/Bowel: Trace perihepatic ascites. Stomach, small bowel, and large bowel are unremarkable save for mild sigmoid diverticulosis. Appendix normal. No free intraperitoneal gas. Vascular/Lymphatic: No significant vascular findings are present. No enlarged abdominal or pelvic lymph nodes. Reproductive: Prostate is unremarkable. Other: No abdominal wall hernia. Mild diffuse subcutaneous body wall edema. Musculoskeletal: No acute or significant osseous findings. Review of the MIP images confirms the above findings. IMPRESSION: 1. No acute pulmonary embolism. 2. Mild to moderate global cardiomegaly. Reflux of contrast into the hepatic venous systems in keeping with at least some degree of right heart failure. 3. Striated bilateral cortical nephrogram has progressed slightly since prior examination suggesting an infectious or inflammatory process as can be seen with pyelonephritis and/or acute kidney injury. Correlation with urinalysis and renal function tests are recommended. 4. Gallbladder wall  thickening and pericholecystic edema, nonspecific in the setting of trace perihepatic ascites and evidence of cardiogenic failure. While this can simply represent a component of congestive hepatopathy, acute inflammatory conditions of the liver, biliary tree or gallbladder itself including acute cholecystitis, could appear in this fashion. Right upper quadrant sonography and or hepatic biliary scintigraphy may be helpful for further evaluation if there is clinical concern for acute cholecystitis. 5. Mild sigmoid diverticulosis. 6. Aortic atherosclerosis. Aortic Atherosclerosis (ICD10-I70.0). Electronically Signed   By: Worthy Heads M.D.   On: 05/22/2024 21:23   DG Chest Portable 1 View Result Date: 05/22/2024 CLINICAL DATA:  Chest pain EXAM: PORTABLE CHEST 1 VIEW COMPARISON:  04/19/2024 FINDINGS: Right ventricular ICD. Chin overlies the right apex and thoracic inlet. Midline trachea. Moderate cardiomegaly. No pleural effusion or pneumothorax. No congestive failure. Clear lungs. IMPRESSION: Cardiomegaly without congestive failure. Electronically Signed   By: Lore Rode M.D.   On: 05/22/2024 17:27       Discharge Exam: Vitals:   05/29/24 0542 05/29/24 0732  BP: (!) 89/73 (!) 83/67  Pulse: (!) 108   Resp:  15  Temp: 97.6 F (36.4 C)  SpO2: 100% 98%    General: Pt is alert, awake, not in acute distress Cardiovascular: S1/S2 +, no edema Respiratory: CTA bilaterally, no wheezing, no rhonchi, no respiratory distress, no conversational dyspnea  Abdominal: Soft, NT, ND, bowel sounds + Extremities: no edema, no cyanosis Psych: Normal mood and affect, stable judgement and insight     The results of significant diagnostics from this hospitalization (including imaging, microbiology, ancillary and laboratory) are listed below for reference.     Microbiology: No results found for this or any previous visit (from the past 240 hours).   Labs: BNP (last 3 results) Recent Labs    03/17/24 2358  04/19/24 1606 05/22/24 1700  BNP 1,875.5* 1,490.0* 1,241.9*   Basic Metabolic Panel: Recent Labs  Lab 05/22/24 1700 05/22/24 2358 05/23/24 0509 05/26/24 0516 05/27/24 0508  NA 138 138 141 136 136  K 5.8* 4.5 4.2 4.4 3.1*  CL 106 106 112* 102 101  CO2 19* 19* 17* 18* 21*  GLUCOSE 102* 79 73 110* 82  BUN 27* 26* 26* 57* 51*  CREATININE 1.93* 1.68* 1.76* 3.11* 2.67*  CALCIUM  9.3 8.8* 9.2 8.6* 8.4*  MG  --  2.0 1.9 2.2  --   PHOS  --  3.7 3.4 6.5*  --    Liver Function Tests: Recent Labs  Lab 05/22/24 1700 05/22/24 2358 05/23/24 0509 05/26/24 0516 05/27/24 0508  AST 45* 39 38 591* 297*  ALT 47* 45* 45* 611* 515*  ALKPHOS 78 75 70 83 85  BILITOT 3.1* 2.4* 2.6* 2.6* 2.5*  PROT 7.0 6.2* 6.0* 6.1* 5.8*  ALBUMIN  4.0 3.7 3.4* 3.6 3.5   No results for input(s): LIPASE, AMYLASE in the last 168 hours. No results for input(s): AMMONIA in the last 168 hours. CBC: Recent Labs  Lab 05/22/24 1700 05/23/24 0509 05/26/24 0516  WBC 5.2 3.1* 4.5  NEUTROABS 3.7  --  3.0  HGB 15.4 14.0 13.1  HCT 45.1 40.4 37.2*  MCV 100.0 98.8 97.1  PLT 160 122* 111*   Cardiac Enzymes: Recent Labs  Lab 05/22/24 2358  CKTOTAL 67   BNP: Invalid input(s): POCBNP CBG: No results for input(s): GLUCAP in the last 168 hours. D-Dimer No results for input(s): DDIMER in the last 72 hours. Hgb A1c No results for input(s): HGBA1C in the last 72 hours. Lipid Profile No results for input(s): CHOL, HDL, LDLCALC, TRIG, CHOLHDL, LDLDIRECT in the last 72 hours. Thyroid  function studies No results for input(s): TSH, T4TOTAL, T3FREE, THYROIDAB in the last 72 hours.  Invalid input(s): FREET3 Anemia work up No results for input(s): VITAMINB12, FOLATE, FERRITIN, TIBC, IRON, RETICCTPCT in the last 72 hours. Urinalysis    Component Value Date/Time   COLORURINE YELLOW 05/22/2024 2252   APPEARANCEUR CLEAR 05/22/2024 2252   LABSPEC 1.043 (H) 05/22/2024 2252    PHURINE 6.0 05/22/2024 2252   GLUCOSEU NEGATIVE 05/22/2024 2252   HGBUR NEGATIVE 05/22/2024 2252   BILIRUBINUR NEGATIVE 05/22/2024 2252   BILIRUBINUR Negative 06/15/2016 1542   KETONESUR NEGATIVE 05/22/2024 2252   PROTEINUR 30 (A) 05/22/2024 2252   UROBILINOGEN 0.2 08/01/2021 0834   NITRITE NEGATIVE 05/22/2024 2252   LEUKOCYTESUR NEGATIVE 05/22/2024 2252   Sepsis Labs Recent Labs  Lab 05/22/24 1700 05/23/24 0509 05/26/24 0516  WBC 5.2 3.1* 4.5   Microbiology No results found for this or any previous visit (from the past 240 hours).   Patient was seen and examined on the day of discharge and was found to be in stable condition. Time coordinating discharge:  40 minutes including assessment and coordination of care, as well as examination of the patient.   SIGNED:  Daren Eck, DO [facility] 05/29/2024, 11:06 AM

## 2024-05-28 NOTE — Progress Notes (Signed)
 Daily Progress Note   Patient Name: Ryan Long       Date: 05/28/2024 DOB: 1961-05-01  Age: 64 y.o. MRN#: [id number] Attending Physician: Daren Eck, DO Primary Care Physician: Tretha Fu, MD Admit Date: 05/22/2024 Length of Stay: 6 days  Reason for Consultation/Follow-up: Establishing goals of care  Subjective:   CC: Patient resting in bed, discharged home with hospice. Following up regarding complex medical decision making and symptom management.  Subjective:  Reviewed recent EMR documentation from hospitalist and TOC.   Medication history also noted.  Patient is resting in bed.  Discussed with spiritual care.   Review of Systems Pain, shortness of breath improving Objective:   Vital Signs:  BP (!) 89/77 (BP Location: Right Arm)   Pulse (!) 114   Temp 99.3 F (37.4 C) (Oral)   Resp 18   SpO2 97%   Physical Exam: General: NAD, alert, laying in bed, chronically ill appearing Cardiovascular: tachycardia noted Respiratory: No increased work of breathing noted, not in respiratory distress Neuro: Awake, interactive Psych: appropriately answers all questions, pleasant   Assessment & Plan:   Assessment: Patient is a 63 year old male with past medical history of end-stage heart failure with known ejection fraction of 20%, hypertension, hyperlipidemia, depression, bipolar disorder, HIV, and recurrent hospitalizations who was admitted on 05/22/2024 for management of epigastric and chest pain for the last week.  Patient had recently been hospitalized and discharged to long-term care facility with hospice on 05/09/2024.  Upon admission, patient found to have suspected acute cholecystitis; patient not a candidate for surgical interventions due to multiple comorbidities.  Patient also receiving management for acute on chronic end-stage systolic heart failure.  Palliative medicine team consulted to assist with complex medical decision making. Of note patient is known  to PMT due to multiple hospital admissions.  Recommendations/Plan: # Complex medical decision making/goals of care:       - Discussed care with patient as detailed above in HPI.  Patient with known difficult social support circumstances.  Patient wanting to continue appropriate medical interventions at this time for management of suspected acute cholecystitis.  Patient has already agreed to DNR/DNI.   TOC assisting with discharge planning, appreciate their assistance.                  -  Code Status: Limited: Do not attempt resuscitation (DNR) -DNR-LIMITED -Do Not Intubate/DNI     # Symptom management Patient is receiving these palliative interventions for symptom management with an intent to improve quality of life.                 -Pain, shortness of breath in setting of acute cholecystitis and acute on chronic end-stage HFrEF                               - Continue p.o. oxycodone  solution 7.5 mg every 4 hours as needed                                # Psycho-social/Spiritual Support:  - Support System: Ryan Long   # Discharge Planning: Home with hospice Discussed with: Interdisciplinary team Thank you for allowing the palliative care team to participate in the care [facility].  Low MDM Lujean Sake MD Palliative Care Provider PMT # [phone]  If patient remains symptomatic despite maximum doses, please call PMT at [phone] between  0700 and 1900. Outside of these hours, please call attending, as PMT does not have night coverage.

## 2024-05-28 NOTE — Progress Notes (Signed)
 PT Cancellation Note  Patient Details Name: Ryan Long MRN: [id number] DOB: November 29, 1961   Cancelled Treatment:    Reason Eval/Treat Not Completed: Pain limiting ability to participate;Medical issues which prohibited therapy (pt reports having headache and nausea, he recently received medicine for the nausea but he hasn't felt any relief yet. Pt requested PT check back later. Will follow.)   Daymon Evans PT 05/28/2024  Acute Rehabilitation Services  Office [phone]

## 2024-05-29 ENCOUNTER — Other Ambulatory Visit (HOSPITAL_COMMUNITY): Payer: Self-pay

## 2024-05-29 DIAGNOSIS — Z515 Encounter for palliative care: Secondary | ICD-10-CM

## 2024-05-29 DIAGNOSIS — Z7189 Other specified counseling: Secondary | ICD-10-CM

## 2024-05-29 DIAGNOSIS — I5022 Chronic systolic (congestive) heart failure: Secondary | ICD-10-CM

## 2024-05-29 DIAGNOSIS — E440 Moderate protein-calorie malnutrition: Secondary | ICD-10-CM | POA: Diagnosis not present

## 2024-05-29 DIAGNOSIS — N1831 Chronic kidney disease, stage 3a: Secondary | ICD-10-CM

## 2024-05-29 DIAGNOSIS — Z79899 Other long term (current) drug therapy: Secondary | ICD-10-CM

## 2024-05-29 MED ORDER — PROCHLORPERAZINE MALEATE 10 MG PO TABS: 10.0000 mg | ORAL_TABLET | Freq: Four times a day (QID) | ORAL | Status: DC | PRN

## 2024-05-29 MED ORDER — LORAZEPAM 2 MG/ML PO CONC: 1.0000 mg | ORAL | Status: DC | PRN

## 2024-05-29 MED ORDER — LORAZEPAM 2 MG/ML IJ SOLN: 1.0000 mg | INTRAMUSCULAR | Status: DC | PRN

## 2024-05-29 MED ORDER — GLYCOPYRROLATE 1 MG PO TABS: 1.0000 mg | ORAL_TABLET | ORAL | Status: DC | PRN

## 2024-05-29 MED ORDER — AMIODARONE HCL 200 MG PO TABS
200.0000 mg | ORAL_TABLET | Freq: Every day | ORAL | Status: DC
  Administered 2024-05-29: 200 mg via ORAL
  Filled 2024-05-29: qty 1

## 2024-05-29 MED ORDER — BIOTENE DRY MOUTH MT LIQD
15.0000 mL | OROMUCOSAL | Status: DC | PRN
Start: 2024-05-29 — End: 2024-05-30

## 2024-05-29 MED ORDER — HYDROMORPHONE HCL 1 MG/ML IJ SOLN
1.0000 mg | Freq: Once | INTRAMUSCULAR | Status: AC
  Administered 2024-05-29: 1 mg via INTRAVENOUS
  Filled 2024-05-29: qty 1

## 2024-05-29 MED ORDER — HYDROMORPHONE HCL 1 MG/ML IJ SOLN
0.5000 mg | INTRAMUSCULAR | Status: DC | PRN
  Administered 2024-05-29 (×3): 1 mg via INTRAVENOUS
  Filled 2024-05-29 (×3): qty 1

## 2024-05-29 MED ORDER — GLYCOPYRROLATE 0.2 MG/ML IJ SOLN: 0.2000 mg | INTRAMUSCULAR | Status: DC | PRN

## 2024-05-29 MED ORDER — OXYCODONE HCL ER 10 MG PO T12A
10.0000 mg | EXTENDED_RELEASE_TABLET | Freq: Two times a day (BID) | ORAL | 0 refills | Status: DC
  Filled 2024-05-29: qty 14, 7d supply, fill #0

## 2024-05-29 MED ORDER — HYDROMORPHONE HCL 1 MG/ML PO LIQD
2.0000 mg | ORAL | Status: DC
  Administered 2024-05-29 (×3): 2 mg via ORAL
  Filled 2024-05-29 (×3): qty 2

## 2024-05-29 MED ORDER — OXYCODONE HCL 5 MG/5ML PO SOLN: 10.0000 mg | ORAL | Status: DC | PRN

## 2024-05-29 MED ORDER — LORAZEPAM 1 MG PO TABS
1.0000 mg | ORAL_TABLET | ORAL | 0 refills | Status: DC | PRN
  Filled 2024-05-29: qty 30, 4d supply, fill #0

## 2024-05-29 MED ORDER — LORAZEPAM 1 MG PO TABS
1.0000 mg | ORAL_TABLET | ORAL | Status: DC | PRN
  Administered 2024-05-29: 1 mg via ORAL
  Filled 2024-05-29: qty 1

## 2024-05-29 MED ORDER — POLYVINYL ALCOHOL 1.4 % OP SOLN: 1.0000 [drp] | Freq: Four times a day (QID) | OPHTHALMIC | Status: DC | PRN

## 2024-05-29 MED ORDER — OXYCODONE HCL ER 10 MG PO T12A
10.0000 mg | EXTENDED_RELEASE_TABLET | Freq: Two times a day (BID) | ORAL | Status: DC
  Administered 2024-05-29: 10 mg via ORAL
  Filled 2024-05-29: qty 1

## 2024-05-29 NOTE — Progress Notes (Addendum)
  PROGRESS NOTE  After discussion with patient, RN, palliative care team, TOC, seems like we are moving forward with inpatient hospice placement. He does not have enough support at home for home hospice to be feasible. TOC referral in place. In the meanwhile, will try and optimize his symptom management meds.    Ryan Eck, DO [facility] 05/29/2024, 8:46 AM  Available via Epic secure chat 7am-7pm After these hours, please refer to coverage provider listed on [email or web address]

## 2024-05-29 NOTE — Care Management Important Message (Signed)
 Important Message  Patient Details No IM Letter given due to discharging with Home Hospice. Name: Ryan Long MRN: [id number] Date of Birth: Jul 11, 1961   Important Message Given:  No     [name] 05/29/2024, 11:44 AM

## 2024-05-29 NOTE — TOC Transition Note (Signed)
 Transition of Care [facility]) - Discharge Note   Patient Details  Name: Ryan Long MRN: [id number] Date of Birth: 11/06/1961  Transition of Care [facility]) CM/SW Contact:  Ruben Corolla, RN Phone Number: 05/29/2024, 9:36 AM   Clinical Narrative: d/c home w/[facility] home hospice rep [facility] aware. Has dme 3n1. Has own transport home. No further CM needs.      Final next level of care: Home w Hospice Care Barriers to Discharge: No Barriers Identified   Patient Goals and CMS Choice Patient states their goals for this hospitalization and ongoing recovery are:: Home CMS Medicare.gov Compare Post Acute Care list provided to:: Patient Choice offered to / list presented to : Patient [place] ownership interest in [facility].provided to:: Patient    Discharge Placement                       Discharge Plan and Services Additional resources added to the After Visit Summary for   In-house Referral: NA Discharge Planning Services: CM Consult Post Acute Care Choice: Hospice          DME Arranged: 3-N-1 DME Agency: [facility] [employer]       HH Arranged: RN HH Agency: Other - See comment (Home hospice [facility])        Social Drivers of Health (SDOH) Interventions SDOH Screenings   Food Insecurity: No Food Insecurity (05/23/2024)  Housing: Low Risk  (05/23/2024)  Transportation Needs: No Transportation Needs (05/23/2024)  Utilities: Not At Risk (05/23/2024)  Alcohol  Screen: Low Risk  (07/17/2023)  Depression (PHQ2-9): Low Risk  (08/01/2023)  Financial Resource Strain: Low Risk  (07/17/2023)  Physical Activity: Insufficiently Active (07/17/2023)  Social Connections: Socially Isolated (04/19/2024)  Stress: Stress Concern Present (07/17/2023)  Tobacco Use: High Risk (05/27/2024)  Health Literacy: Adequate Health Literacy (07/18/2023)     Readmission Risk Interventions    05/26/2024    1:15 PM 03/19/2024    3:54 PM  Readmission Risk Prevention Plan  Transportation  Screening Complete Complete  PCP or Specialist Appt within 3-5 Days  Complete  HRI or Home Care Consult  Complete  Medication Review (RN Care Manager) Complete Complete  PCP or Specialist appointment within 3-5 days of discharge Complete   SW Recovery Care/Counseling Consult Complete   Palliative Care Screening Not Applicable   Skilled Nursing Facility Complete

## 2024-05-29 NOTE — Progress Notes (Signed)
 Daily Progress Note   Patient Name: Ryan Long       Date: 05/29/2024 DOB: 08-07-1961  Age: 63 y.o. MRN#: [id number] Attending Physician: Daren Eck, DO Primary Care Physician: Tretha Fu, MD Admit Date: 05/22/2024 Length of Stay: 7 days  Reason for Consultation/Follow-up: Establishing goals of care, Non pain symptom management, and Pain control  Subjective:   CC: Patient resting in bed, discharged home with hospice. Following up regarding complex medical decision making and symptom management.  Subjective: Medical records reviewed including progress notes, labs, imaging.  Per MAR, he received 4 doses of as needed oxycodone  in the past 24 hours with little to no benefit pain/air hunger.  Discussed with primary hospitalist and RN.  Patient has been anxious about discharging with hospice given his continued symptoms.  Patient assessed at the bedside.  He reports 7 out of 10 chest pain with no improvement after receiving recent dose of oxycodone .  His chest pain is worse than his anxiety at this time.  Counseled on strategies for improving symptoms with oral medicines his goal of returning home with hospice.  He is agreeable to starting a long-acting OxyContin  and increasing dose of his as needed oxycodone .  We discussed the availability of oral Compazine  for his nausea and increasing frequency of oral Ativan  to every 3 hours as needed as well.  Patient understands the plan and shares that he would like to stay in the hospital for another day to get things straight before discharging home tomorrow.  He is also interested in additional diuretics for his leg edema.  Later in the day, I received update from RN that patient had declined further and did not appear to be stable to return home with hospice and no other caregiver support.  He was agreeable to an inpatient hospice facility after thorough discussion with RN.  I returned to the bedside and patient was lethargic after  receiving as needed IV Dilaudid .  He was easily aroused and indicated that he did want to focus on his comfort while remaining in the hospital.  I was not able to discuss his hospice facility decision with him though I feel this would be the best plan given his need for ongoing IV medications and escalating symptom burden.  Questions and concerns addressed. PMT will continue to support holistically.    Review of Systems  Respiratory:  Positive for shortness of breath.   Cardiovascular:  Positive for chest pain.  Gastrointestinal:  Positive for nausea.  Psychiatric/Behavioral:  The patient is nervous/anxious.    Objective:   Vital Signs:  BP 112/84 (BP Location: Right Arm)   Pulse (!) 108   Temp 97.6 F (36.4 C) (Oral)   Resp (!) 24   SpO2 98%   Physical Exam: General: NAD, alert, laying in bed, chronically ill appearing Cardiovascular: tachycardia noted Respiratory: No increased work of breathing noted, not in respiratory distress Neuro: Awake, interactive, falls asleep easily Psych: appropriately answers all questions, hyperventilating when aroused with even and unlabored breathing after he falls back asleep   Assessment & Plan:   Assessment: Patient is a 63 year old male with past medical history of end-stage heart failure with known ejection fraction of 20%, hypertension, hyperlipidemia, depression, bipolar disorder, HIV, and recurrent hospitalizations who was admitted on 05/22/2024 for management of epigastric and chest pain for the last week.  Patient had recently been hospitalized and discharged to long-term care facility with hospice on 05/09/2024.  Upon admission, patient found to have suspected acute cholecystitis; patient  not a candidate for surgical interventions due to multiple comorbidities.  Patient also receiving management for acute on chronic end-stage systolic heart failure.  Palliative medicine team consulted to assist with complex medical decision making. Of note  patient is known to PMT due to multiple hospital admissions.  Recommendations/Plan: # Complex medical decision making/goals of care: Patient is ready for comfort focused care and transition to hospice facility in light of his worsening symptom burden today # Symptom management Patient is receiving these palliative interventions for symptom management with an intent to improve quality of life.                 -Pain, shortness of breath, anxiety in setting of acute cholecystitis and acute on chronic end-stage HFrEF Dilaudid  2 mg p.o. every 4 hours Dilaudid  0.5 to 1 mg IV every 30 minutes PRN for pain/air hunger/comfort Robinul PRN for excessive secretions Ativan  PRN for agitation/anxiety Compazine  PRN for nausea Liquifilm tears PRN for dry eyes May have comfort feeding Oxygen  PRN 2L or less for comfort. No escalation.                        # Psycho-social/Spiritual Support:  - Provided emotional support and therapeutic listening # Discharge Planning: Hospice facility.  TOC consulted for referral   Discussed with: Interdisciplinary team, patient   MDM: High  Samyia Motter Ladonna Pickup Palliative Medicine Team Team phone # [phone]  Thank you for allowing the Palliative Medicine Team to assist in the care of this patient. Please utilize secure chat with additional questions, if there is no response within 30 minutes please call the above phone number.  Palliative Medicine Team providers are available by phone from 7am to 7pm daily and can be reached through the team cell phone.  Should this patient require assistance outside of these hours, please call the patient's attending physician.  Portions of this note are a verbal dictation therefore any spelling and/or grammatical errors are due to the Dragon Medical One system interpretation.

## 2024-05-29 NOTE — TOC Progression Note (Signed)
 Transition of Care [facility]) - Progression Note    Patient Details  Name: Ryan Long MRN: [id number] Date of Birth: [birth year]  [facility]) CM/SW Contact  Shemar Plemmons, Thersia Flax, RN Phone Number: 05/29/2024, 2:15 PM  Clinical Narrative:Referral for Residential hospice-Patient agreed to [facility] to eval-await outcome.       Expected Discharge Plan: Hospice Medical Facility Barriers to Discharge: Continued Medical Work up  Expected Discharge Plan and Services In-house Referral: NA Discharge Planning Services: CM Consult Post Acute Care Choice: Hospice Living arrangements for the past 2 months: Single Family Home Expected Discharge Date: 05/29/24               DME Arranged: 3-N-1 DME Agency: [employer]       HH Arranged: RN HH Agency: Other - See comment (Home hospice [facility])         Social Determinants of Health (SDOH) Interventions SDOH Screenings   Food Insecurity: No Food Insecurity (05/23/2024)  Housing: Low Risk  (05/23/2024)  Transportation Needs: No Transportation Needs (05/23/2024)  Utilities: Not At Risk (05/23/2024)  Alcohol  Screen: Low Risk  (07/17/2023)  Depression (PHQ2-9): Low Risk  (08/01/2023)  Financial Resource Strain: Low Risk  (07/17/2023)  Physical Activity: Insufficiently Active (07/17/2023)  Social Connections: Socially Isolated (04/19/2024)  Stress: Stress Concern Present (07/17/2023)  Tobacco Use: High Risk (05/27/2024)  Health Literacy: Adequate Health Literacy (07/18/2023)    Readmission Risk Interventions    05/26/2024    1:15 PM 03/19/2024    3:54 PM  Readmission Risk Prevention Plan  Transportation Screening Complete Complete  PCP or Specialist Appt within 3-5 Days  Complete  HRI or Home Care Consult  Complete  Medication Review (RN Care Manager) Complete Complete  PCP or Specialist appointment within 3-5 days of discharge Complete   SW Recovery Care/Counseling Consult Complete   Palliative Care Screening Not  Applicable   Skilled Nursing Facility Complete

## 2024-05-29 NOTE — Plan of Care (Signed)

## 2024-06-17 NOTE — Progress Notes (Signed)
 Patient has medications in the pharmacy that were delivered by community pharmacy.   These medications were intended to be for patient discharge home with hospice care. Due to patient condition change and lack of caregiver support  it was determined that residential hospice was needed.  These medications are controlled substances and could not be kept in the room and were sent to the pharmacy with the appropriate paperwork.

## 2024-06-17 NOTE — Death Summary Note (Signed)
 DEATH SUMMARY   Patient Details  Name: Ryan Long MRN: [id number] DOB: [birth year] [id number], Ryan Chapel, MD Admission/Discharge Information   Admit Date:  06-05-24  Date of Death: Date of Death: 06/13/2024  Time of Death: Time of Death: 0210  Length of Stay: 8   Principle Cause of death: End stage heart failure   Hospital Diagnoses: Principal Problem:   End-stage systolic heart failure, chronic (HCC) Active Problems:   Hypokalemia   Chest pain   Paroxysmal atrial fibrillation (HCC)   HIV disease (HCC)   COPD (chronic obstructive pulmonary disease) (HCC)   CKD stage 3a, GFR 45-59 ml/min (HCC)   QT prolongation   Polycythemia   Protein-calorie malnutrition, moderate (HCC)   Hyperkalemia   AKI (acute kidney injury) (HCC)   Acute urinary retention   DNR (do not resuscitate)   Shortness of breath   Pain   Counseling and coordination of care   Palliative care encounter   High risk medication use   Medication management   Elevated LFTs  [name] is a 63 year old gentleman with history of end-stage heart failure with known ejection fraction 20%, hypertension, hyperlipidemia, depression, bipolar disorder and HIV with recurrent hospitalization and discharged to long-term care facility with hospice on 5/23, was suffering from epigastric and chest pain for last 1 week revoked his hospice status, left the nursing home and asked his previous landlord to bring him to ER.  He has been suffering from right upper quadrant pain and intermittent chest pain. At the emergency room, complaining of chest pain relieved with morphine .  Tachycardic.  On room air.  Right upper quadrant ultrasound consistent with pericholecystic fluid without gallbladder stone.  Seen by surgery.  Possible acute cholecystitis, conservative management advised as patient is not a surgical candidate given his poor cardiovascular status. Cholecystitis ruled out with negative HIDA scan.  Initially  had declined for facility placement and wanted to go home with home hospice. However, he continued to feel poorly and decided to pursue residential hospice placement. He was pronounced deceased 2024/06/13.        The results of significant diagnostics from this hospitalization (including imaging, microbiology, ancillary and laboratory) are listed below for reference.   Significant Diagnostic Studies: NM Hepatobiliary Liver Func Result Date: 05/26/2024 CLINICAL DATA:  Abdominal pain, upper, chronic, assess gallbladder motility Gallbladder wall thickening and pericholecystic fluid on ultrasound, without sonographic Murphy sign. EXAM: NUCLEAR MEDICINE HEPATOBILIARY IMAGING TECHNIQUE: Sequential images of the abdomen were obtained out to 60 minutes following intravenous administration of radiopharmaceutical. RADIOPHARMACEUTICALS:  7.22 mCi Tc-72m  Choletec  IV COMPARISON:  Abdominal ultrasound 05-Jun-2024 and 03/18/2024. Abdominopelvic CT 06/05/2024 and 03/18/2024. FINDINGS: Study is mildly motion degraded. Prompt uptake and biliary excretion of activity by the liver is seen. Biliary activity passes into the small bowel, consistent with patency of the common bile duct. There was delayed filling of the gallbladder lumen, consistent with patency of the cystic duct. No evidence of biliary leak or obstruction. IMPRESSION: 1. The cystic and common bile ducts are patent. No evidence of cholecystitis. 2. No evidence of bile leak. Electronically Signed   By: Elmon Hagedorn M.D.   On: 05/26/2024 17:43   US  Abdomen Limited RUQ (LIVER/GB) Result Date: 06-05-24 CLINICAL DATA:  Cholecystitis. EXAM: ULTRASOUND ABDOMEN LIMITED RIGHT UPPER QUADRANT COMPARISON:  CT abdomen and pelvis Jun 05, 2024 FINDINGS: Gallbladder: There is gallbladder wall thickening measuring 4.8 mm. Pericholecystic is present. No calculi are seen. Sonographic Abigail Abler sign is negative. Common bile duct: Diameter: 4.1 mm Liver:  No focal lesion identified.  Mildly nodular contour. Within normal limits in parenchymal echogenicity. Portal vein is patent on color Doppler imaging with normal direction of blood flow towards the liver. Other: Trace free fluid in the right upper quadrant. IMPRESSION: 1. Gallbladder wall thickening and pericholecystic fluid. No calculi are seen. Sonographic Abigail Abler sign is negative. Findings are equivocal for acute cholecystitis. 2. Mildly nodular contour of the liver, which can be seen in the setting of cirrhosis. 3. Trace free fluid in the right upper quadrant. Electronically Signed   By: Tyron Gallon M.D.   On: 05/22/2024 22:20   CT Angio Chest PE W and/or Wo Contrast Result Date: 05/22/2024 CLINICAL DATA:  None EXAM: CT ANGIOGRAPHY CHEST CT ABDOMEN AND PELVIS WITH CONTRAST TECHNIQUE: Multidetector CT imaging of the chest was performed using the standard protocol during bolus administration of intravenous contrast. Multiplanar CT image reconstructions and MIPs were obtained to evaluate the vascular anatomy. Multidetector CT imaging of the abdomen and pelvis was performed using the standard protocol during bolus administration of intravenous contrast. RADIATION DOSE REDUCTION: This exam was performed according to the departmental dose-optimization program which includes automated exposure control, adjustment of the mA and/or kV according to patient size and/or use of iterative reconstruction technique. CONTRAST:  80mL OMNIPAQUE  IOHEXOL  350 MG/ML SOLN COMPARISON:  None Available. FINDINGS: CTA CHEST FINDINGS Cardiovascular: Mild to moderate global cardiomegaly. There is adequate opacification of the pulmonary arterial tree. No intraluminal filling defect identified to suggest acute pulmonary embolism. The central pulmonary arteries are of normal caliber. Reflux of contrast into the hepatic venous systems in keeping with at least some degree of right heart failure. No pericardial effusion. Mild atherosclerotic calcification within the  thoracic aorta. Left single lead transvenous pacemaker is in place with its lead within the right ventricle. Mediastinum/Nodes: No enlarged mediastinal, hilar, or axillary lymph nodes. Thyroid  gland, trachea, and esophagus demonstrate no significant findings. Lungs/Pleura: Lungs are clear. No pleural effusion or pneumothorax. Musculoskeletal: No chest wall abnormality. No acute or significant osseous findings. Review of the MIP images confirms the above findings. CT ABDOMEN and PELVIS FINDINGS Hepatobiliary: There is gallbladder wall thickening and pericholecystic edema, nonspecific in the setting trace perihepatic ascites and evidence of cardiogenic failure. No enhancing intrahepatic mass. No intra or extrahepatic biliary ductal dilation. Pancreas: Unremarkable Spleen: Unremarkable Adrenals/Urinary Tract: The adrenal glands are unremarkable. Striated bilateral cortical nephrogram has progressed slightly since prior examination suggesting an infectious or inflammatory process as can be seen with pyelonephritis and/or acute kidney injury. The kidneys are normal in size and position. No hydronephrosis. No intrarenal or ureteral calculi. The bladder is unremarkable. Stomach/Bowel: Trace perihepatic ascites. Stomach, small bowel, and large bowel are unremarkable save for mild sigmoid diverticulosis. Appendix normal. No free intraperitoneal gas. Vascular/Lymphatic: No significant vascular findings are present. No enlarged abdominal or pelvic lymph nodes. Reproductive: Prostate is unremarkable. Other: No abdominal wall hernia. Mild diffuse subcutaneous body wall edema. Musculoskeletal: No acute or significant osseous findings. Review of the MIP images confirms the above findings. IMPRESSION: 1. No acute pulmonary embolism. 2. Mild to moderate global cardiomegaly. Reflux of contrast into the hepatic venous systems in keeping with at least some degree of right heart failure. 3. Striated bilateral cortical nephrogram has  progressed slightly since prior examination suggesting an infectious or inflammatory process as can be seen with pyelonephritis and/or acute kidney injury. Correlation with urinalysis and renal function tests are recommended. 4. Gallbladder wall thickening and pericholecystic edema, nonspecific in the setting of trace perihepatic ascites  and evidence of cardiogenic failure. While this can simply represent a component of congestive hepatopathy, acute inflammatory conditions of the liver, biliary tree or gallbladder itself including acute cholecystitis, could appear in this fashion. Right upper quadrant sonography and or hepatic biliary scintigraphy may be helpful for further evaluation if there is clinical concern for acute cholecystitis. 5. Mild sigmoid diverticulosis. 6. Aortic atherosclerosis. Aortic Atherosclerosis (ICD10-I70.0). Electronically Signed   By: Worthy Heads M.D.   On: 05/22/2024 21:23   CT ABDOMEN PELVIS W CONTRAST Result Date: 05/22/2024 CLINICAL DATA:  None EXAM: CT ANGIOGRAPHY CHEST CT ABDOMEN AND PELVIS WITH CONTRAST TECHNIQUE: Multidetector CT imaging of the chest was performed using the standard protocol during bolus administration of intravenous contrast. Multiplanar CT image reconstructions and MIPs were obtained to evaluate the vascular anatomy. Multidetector CT imaging of the abdomen and pelvis was performed using the standard protocol during bolus administration of intravenous contrast. RADIATION DOSE REDUCTION: This exam was performed according to the departmental dose-optimization program which includes automated exposure control, adjustment of the mA and/or kV according to patient size and/or use of iterative reconstruction technique. CONTRAST:  80mL OMNIPAQUE  IOHEXOL  350 MG/ML SOLN COMPARISON:  None Available. FINDINGS: CTA CHEST FINDINGS Cardiovascular: Mild to moderate global cardiomegaly. There is adequate opacification of the pulmonary arterial tree. No intraluminal filling  defect identified to suggest acute pulmonary embolism. The central pulmonary arteries are of normal caliber. Reflux of contrast into the hepatic venous systems in keeping with at least some degree of right heart failure. No pericardial effusion. Mild atherosclerotic calcification within the thoracic aorta. Left single lead transvenous pacemaker is in place with its lead within the right ventricle. Mediastinum/Nodes: No enlarged mediastinal, hilar, or axillary lymph nodes. Thyroid  gland, trachea, and esophagus demonstrate no significant findings. Lungs/Pleura: Lungs are clear. No pleural effusion or pneumothorax. Musculoskeletal: No chest wall abnormality. No acute or significant osseous findings. Review of the MIP images confirms the above findings. CT ABDOMEN and PELVIS FINDINGS Hepatobiliary: There is gallbladder wall thickening and pericholecystic edema, nonspecific in the setting trace perihepatic ascites and evidence of cardiogenic failure. No enhancing intrahepatic mass. No intra or extrahepatic biliary ductal dilation. Pancreas: Unremarkable Spleen: Unremarkable Adrenals/Urinary Tract: The adrenal glands are unremarkable. Striated bilateral cortical nephrogram has progressed slightly since prior examination suggesting an infectious or inflammatory process as can be seen with pyelonephritis and/or acute kidney injury. The kidneys are normal in size and position. No hydronephrosis. No intrarenal or ureteral calculi. The bladder is unremarkable. Stomach/Bowel: Trace perihepatic ascites. Stomach, small bowel, and large bowel are unremarkable save for mild sigmoid diverticulosis. Appendix normal. No free intraperitoneal gas. Vascular/Lymphatic: No significant vascular findings are present. No enlarged abdominal or pelvic lymph nodes. Reproductive: Prostate is unremarkable. Other: No abdominal wall hernia. Mild diffuse subcutaneous body wall edema. Musculoskeletal: No acute or significant osseous findings. Review  of the MIP images confirms the above findings. IMPRESSION: 1. No acute pulmonary embolism. 2. Mild to moderate global cardiomegaly. Reflux of contrast into the hepatic venous systems in keeping with at least some degree of right heart failure. 3. Striated bilateral cortical nephrogram has progressed slightly since prior examination suggesting an infectious or inflammatory process as can be seen with pyelonephritis and/or acute kidney injury. Correlation with urinalysis and renal function tests are recommended. 4. Gallbladder wall thickening and pericholecystic edema, nonspecific in the setting of trace perihepatic ascites and evidence of cardiogenic failure. While this can simply represent a component of congestive hepatopathy, acute inflammatory conditions of the liver, biliary tree or  gallbladder itself including acute cholecystitis, could appear in this fashion. Right upper quadrant sonography and or hepatic biliary scintigraphy may be helpful for further evaluation if there is clinical concern for acute cholecystitis. 5. Mild sigmoid diverticulosis. 6. Aortic atherosclerosis. Aortic Atherosclerosis (ICD10-I70.0). Electronically Signed   By: Worthy Heads M.D.   On: 05/22/2024 21:23   DG Chest Portable 1 View Result Date: 05/22/2024 CLINICAL DATA:  Chest pain EXAM: PORTABLE CHEST 1 VIEW COMPARISON:  04/19/2024 FINDINGS: Right ventricular ICD. Chin overlies the right apex and thoracic inlet. Midline trachea. Moderate cardiomegaly. No pleural effusion or pneumothorax. No congestive failure. Clear lungs. IMPRESSION: Cardiomegaly without congestive failure. Electronically Signed   By: Lore Rode M.D.   On: 05/22/2024 17:27    Microbiology: No results found for this or any previous visit (from the past 240 hours).  Time spent: 15 minutes  Signed: Daren Eck, DO 06/16/2024

## 2024-06-17 NOTE — Progress Notes (Signed)
    Patient Name: Mithran Strike           DOB: November 01, 1961  MRN: [id number]       OVERNIGHT EVENT    Notified by RN that patient has expired at 0210.  2 RN verified. Patient was comfort care.   No family listed in demographics.  RN and I attempted to call emergency contact friend, Margarete Sharps, however we were unable to reach at this time.    Fletcher Rathbun, DNP, ACNPC- [facility]

## 2024-06-17 DEATH — deceased

## 2024-08-26 ENCOUNTER — Other Ambulatory Visit (HOSPITAL_COMMUNITY): Payer: Self-pay
